# Patient Record
Sex: Female | Born: 1945
Health system: Southern US, Community
[De-identification: ages and names within clinical notes are randomized; demographics above are authoritative.]

## PROBLEM LIST (undated history)

## (undated) ENCOUNTER — Ambulatory Visit: Admission: EM

## (undated) DIAGNOSIS — M1990 Unspecified osteoarthritis, unspecified site: Secondary | ICD-10-CM

## (undated) DIAGNOSIS — R011 Cardiac murmur, unspecified: Secondary | ICD-10-CM

## (undated) DIAGNOSIS — C50919 Malignant neoplasm of unspecified site of unspecified female breast: Secondary | ICD-10-CM

## (undated) DIAGNOSIS — I82409 Acute embolism and thrombosis of unspecified deep veins of unspecified lower extremity: Secondary | ICD-10-CM

## (undated) DIAGNOSIS — I2699 Other pulmonary embolism without acute cor pulmonale: Secondary | ICD-10-CM

## (undated) DIAGNOSIS — I10 Essential (primary) hypertension: Secondary | ICD-10-CM

## (undated) DIAGNOSIS — I272 Pulmonary hypertension, unspecified: Secondary | ICD-10-CM

## (undated) DIAGNOSIS — R569 Unspecified convulsions: Secondary | ICD-10-CM

## (undated) DIAGNOSIS — Z923 Personal history of irradiation: Secondary | ICD-10-CM

## (undated) DIAGNOSIS — R519 Headache, unspecified: Secondary | ICD-10-CM

## (undated) DIAGNOSIS — J45909 Unspecified asthma, uncomplicated: Secondary | ICD-10-CM

## (undated) DIAGNOSIS — R51 Headache: Secondary | ICD-10-CM

## (undated) DIAGNOSIS — G8929 Other chronic pain: Secondary | ICD-10-CM

## (undated) DIAGNOSIS — T7840XA Allergy, unspecified, initial encounter: Secondary | ICD-10-CM

## (undated) DIAGNOSIS — R0600 Dyspnea, unspecified: Secondary | ICD-10-CM

## (undated) HISTORY — DX: Cardiac murmur, unspecified: R01.1

## (undated) HISTORY — DX: Unspecified convulsions: R56.9

## (undated) HISTORY — DX: Other pulmonary embolism without acute cor pulmonale: I26.99

## (undated) HISTORY — DX: Other chronic pain: G89.29

## (undated) HISTORY — PX: KNEE ARTHROSCOPY: SHX127

## (undated) HISTORY — DX: Headache, unspecified: R51.9

## (undated) HISTORY — DX: Unspecified osteoarthritis, unspecified site: M19.90

## (undated) HISTORY — DX: Allergy, unspecified, initial encounter: T78.40XA

## (undated) HISTORY — PX: PARTIAL HYSTERECTOMY: SHX80

## (undated) HISTORY — DX: Malignant neoplasm of unspecified site of unspecified female breast: C50.919

## (undated) HISTORY — DX: Acute embolism and thrombosis of unspecified deep veins of unspecified lower extremity: I82.409

## (undated) HISTORY — DX: Dyspnea, unspecified: R06.00

## (undated) HISTORY — PX: TONSILLECTOMY: SUR1361

## (undated) HISTORY — PX: PLANTAR FASCIA SURGERY: SHX746

## (undated) HISTORY — DX: Headache: R51

## (undated) HISTORY — PX: KNEE SURGERY: SHX244

## (undated) HISTORY — DX: Unspecified asthma, uncomplicated: J45.909

## (undated) HISTORY — PX: ABDOMINAL HYSTERECTOMY: SHX81

---

## 1997-12-24 ENCOUNTER — Other Ambulatory Visit: Admission: RE | Admit: 1997-12-24 | Discharge: 1997-12-24 | Payer: Self-pay | Admitting: Internal Medicine

## 1998-05-03 ENCOUNTER — Emergency Department (HOSPITAL_COMMUNITY): Admission: EM | Admit: 1998-05-03 | Discharge: 1998-05-03 | Payer: Self-pay | Admitting: Emergency Medicine

## 1998-10-17 ENCOUNTER — Encounter: Payer: Self-pay | Admitting: *Deleted

## 1998-10-17 ENCOUNTER — Observation Stay (HOSPITAL_COMMUNITY): Admission: EM | Admit: 1998-10-17 | Discharge: 1998-10-18 | Payer: Self-pay | Admitting: *Deleted

## 1998-10-18 ENCOUNTER — Encounter: Payer: Self-pay | Admitting: Internal Medicine

## 1999-01-05 ENCOUNTER — Other Ambulatory Visit: Admission: RE | Admit: 1999-01-05 | Discharge: 1999-01-05 | Payer: Self-pay | Admitting: Internal Medicine

## 1999-07-20 ENCOUNTER — Emergency Department (HOSPITAL_COMMUNITY): Admission: EM | Admit: 1999-07-20 | Discharge: 1999-07-20 | Payer: Self-pay | Admitting: Emergency Medicine

## 1999-07-30 ENCOUNTER — Emergency Department (HOSPITAL_COMMUNITY): Admission: EM | Admit: 1999-07-30 | Discharge: 1999-07-30 | Payer: Self-pay | Admitting: Emergency Medicine

## 2000-05-20 ENCOUNTER — Other Ambulatory Visit: Admission: RE | Admit: 2000-05-20 | Discharge: 2000-05-20 | Payer: Self-pay | Admitting: Internal Medicine

## 2001-02-03 ENCOUNTER — Encounter: Payer: Self-pay | Admitting: Internal Medicine

## 2001-02-03 ENCOUNTER — Encounter: Admission: RE | Admit: 2001-02-03 | Discharge: 2001-02-03 | Payer: Self-pay | Admitting: Internal Medicine

## 2001-08-30 ENCOUNTER — Encounter: Payer: Self-pay | Admitting: *Deleted

## 2001-08-30 ENCOUNTER — Emergency Department (HOSPITAL_COMMUNITY): Admission: EM | Admit: 2001-08-30 | Discharge: 2001-08-30 | Payer: Self-pay | Admitting: *Deleted

## 2001-10-20 ENCOUNTER — Emergency Department (HOSPITAL_COMMUNITY): Admission: EM | Admit: 2001-10-20 | Discharge: 2001-10-21 | Payer: Self-pay | Admitting: *Deleted

## 2001-10-20 ENCOUNTER — Encounter: Payer: Self-pay | Admitting: Emergency Medicine

## 2001-11-20 ENCOUNTER — Emergency Department (HOSPITAL_COMMUNITY): Admission: EM | Admit: 2001-11-20 | Discharge: 2001-11-20 | Payer: Self-pay | Admitting: Emergency Medicine

## 2001-11-20 ENCOUNTER — Encounter: Payer: Self-pay | Admitting: Emergency Medicine

## 2002-04-06 ENCOUNTER — Encounter: Admission: RE | Admit: 2002-04-06 | Discharge: 2002-07-05 | Payer: Self-pay | Admitting: Internal Medicine

## 2002-05-19 ENCOUNTER — Encounter: Admission: RE | Admit: 2002-05-19 | Discharge: 2002-08-17 | Payer: Self-pay | Admitting: Internal Medicine

## 2003-05-25 ENCOUNTER — Encounter: Admission: RE | Admit: 2003-05-25 | Discharge: 2003-05-25 | Payer: Self-pay | Admitting: Orthopedic Surgery

## 2003-05-28 ENCOUNTER — Ambulatory Visit (HOSPITAL_BASED_OUTPATIENT_CLINIC_OR_DEPARTMENT_OTHER): Admission: RE | Admit: 2003-05-28 | Discharge: 2003-05-28 | Payer: Self-pay | Admitting: Orthopedic Surgery

## 2003-05-28 ENCOUNTER — Ambulatory Visit (HOSPITAL_COMMUNITY): Admission: RE | Admit: 2003-05-28 | Discharge: 2003-05-28 | Payer: Self-pay | Admitting: Orthopedic Surgery

## 2003-06-21 ENCOUNTER — Inpatient Hospital Stay (HOSPITAL_COMMUNITY): Admission: EM | Admit: 2003-06-21 | Discharge: 2003-07-02 | Payer: Self-pay | Admitting: Emergency Medicine

## 2003-09-22 ENCOUNTER — Emergency Department (HOSPITAL_COMMUNITY): Admission: AD | Admit: 2003-09-22 | Discharge: 2003-09-23 | Payer: Self-pay | Admitting: Emergency Medicine

## 2003-10-20 ENCOUNTER — Encounter: Admission: RE | Admit: 2003-10-20 | Discharge: 2003-10-20 | Payer: Self-pay | Admitting: Allergy and Immunology

## 2004-03-03 ENCOUNTER — Emergency Department (HOSPITAL_COMMUNITY): Admission: EM | Admit: 2004-03-03 | Discharge: 2004-03-03 | Payer: Self-pay | Admitting: Emergency Medicine

## 2005-02-11 ENCOUNTER — Emergency Department (HOSPITAL_COMMUNITY): Admission: EM | Admit: 2005-02-11 | Discharge: 2005-02-11 | Payer: Self-pay | Admitting: Emergency Medicine

## 2005-03-15 ENCOUNTER — Ambulatory Visit (HOSPITAL_COMMUNITY): Admission: RE | Admit: 2005-03-15 | Discharge: 2005-03-15 | Payer: Self-pay | Admitting: Allergy and Immunology

## 2005-11-18 ENCOUNTER — Emergency Department (HOSPITAL_COMMUNITY): Admission: EM | Admit: 2005-11-18 | Discharge: 2005-11-18 | Payer: Self-pay | Admitting: Emergency Medicine

## 2006-05-20 HISTORY — PX: OTHER SURGICAL HISTORY: SHX169

## 2006-09-13 ENCOUNTER — Ambulatory Visit: Payer: Self-pay | Admitting: Vascular Surgery

## 2007-09-13 ENCOUNTER — Emergency Department (HOSPITAL_COMMUNITY): Admission: EM | Admit: 2007-09-13 | Discharge: 2007-09-13 | Payer: Self-pay | Admitting: Family Medicine

## 2007-10-19 ENCOUNTER — Emergency Department (HOSPITAL_COMMUNITY): Admission: EM | Admit: 2007-10-19 | Discharge: 2007-10-19 | Payer: Self-pay | Admitting: Emergency Medicine

## 2007-11-26 ENCOUNTER — Emergency Department (HOSPITAL_COMMUNITY): Admission: EM | Admit: 2007-11-26 | Discharge: 2007-11-26 | Payer: Self-pay | Admitting: Emergency Medicine

## 2009-05-11 ENCOUNTER — Encounter: Admission: RE | Admit: 2009-05-11 | Discharge: 2009-07-13 | Payer: Self-pay | Admitting: Internal Medicine

## 2009-10-05 ENCOUNTER — Encounter: Admission: RE | Admit: 2009-10-05 | Discharge: 2009-10-05 | Payer: Self-pay | Admitting: Neurology

## 2010-02-28 ENCOUNTER — Encounter: Admission: RE | Admit: 2010-02-28 | Discharge: 2010-02-28 | Payer: Self-pay | Admitting: Orthopedic Surgery

## 2010-10-11 ENCOUNTER — Encounter: Payer: Medicare Other | Attending: Internal Medicine | Admitting: Dietician

## 2010-12-01 NOTE — Discharge Summary (Signed)
[name], [name]                           ACCOUNT NO.:  [id number]   MEDICAL RECORD NO.:  [id number]                   PATIENT TYPE:  INP   LOCATION:  5027                                 FACILITY:  [facility]   PHYSICIAN:  [name], M.D.            DATE OF BIRTH:  Jun 27, 1946   DATE OF ADMISSION:  06/21/2003  DATE OF DISCHARGE:  07/02/2003                                 DISCHARGE SUMMARY   PRIMARY CARE PHYSICIAN:  [name], M.D.   CONSULTANTS:  Dr. Pierce Price, hematology.   DISCHARGE DIAGNOSES:  1. Pulmonary embolism.  2. Seizure disorder.  3. History of asthma.  4. Gastroesophageal reflux disease.  5. Obesity.  6. Endoscopic plantar fasciotomy of the right foot on May 28, 2003.  7. Noted adrenal mass.   DISCHARGE MEDICATIONS:  1. Coumadin 12.5 mg p.o. q.h.s.  2. Percocet 5/325 mg p.o. q.8h. p.r.n.  3. Lexapro 10 mg p.o. daily.  4. Tegretol XR, 400 mg b.i.d.  5. Advair 250/ 50 one b.i.d.  6. Vitamin B12 supplements.  7. Clarinex.  8. Aleve p.r.n.   HOSPITAL COURSE:  This is a 65 year old African American female who was  status post a tarsal tunnel release and endoscopic plantar fasciotomy of  May 28, 2003, who also has a history of asthma and seizure disorder,  who had been essentially at bed rest since her surgery.  On the day of  June 21, 2003, started having acute onset of chest pain and shortness of  breath.  She was brought in. Her EKG showed sinus tachycardia.  BNP was  negative and ABG showed increased pCO2 of 53.  The patient had no previous  history of lung disease.  A CT of the chest showed a left-lower-lobe  atelectasis and a left upper lobe pulmonary embolus.  Dopplers were negative  for DVT although the patient has morbid obesity.  These were poor studies.  She was started on oxygen.  She immediately felt better.   As part of the patient's routine workup, she was started on heparin as well  as oxygen and pain control, and  routine lab work was all normal however.  She was sent for lupus anticoagulant, and this was noted to be positive.  Hematology, Dr. Pierce Price was consulted on June 28, 2003, and his  cardiolipin antibodies were checked as well as protein C&S, and Dr. Elisabeth Price  Leiden studies were all ordered.  In the interim, the patient was started on  Coumadin with therapy goal range of 2 to 3.  However, in the setting of a  possible coagulable state, a goal closer to 3 was preferred.  The patient  tolerated it well.  She had no further drops in her oxygen saturation.  She  was able to ambulate well.  Physical therapy assessed her and found her O2  saturations to be stable, and she did not need intensive physical  therapy  regimen.   In the process of getting her INR in the therapeutic range of 2 to 3, her  lab studies for anticardiolipin antithrombin protein factor V Leiden, as  well as antiphospholipid level all returned negative.  To date, protein C&S  are still pending.  However, on discussion with hematology, the changes for  her likely having coagulopathy are minimal and the patient could safely go  home.  The patient did well, and on July 02, 2003, was in the  therapeutic range age 65.  She was felt to be stable to go home.  The changes  of her having coagulopathy again seemed to be minimal at best.   In regards to the patient's questionable adrenal mass, the patient was  complaining of some light flank  and hip pain which seemed to persist.  A back x-ray showed a questionable  large amount of right stool in the colon, but as the patient was on  anticoagulation, a CT was recommended to rule out a retroperitoneal process  or bleed.  CT of the abdomen and pelvis were done, and again showed no  evidence of any type of bleeding.  It did show what appeared to be a 3.9 cm  mass with calcification in the right adnexal area.  A CA 125 level was  checked which was negative, and a pelvic  ultrasound was ordered and done  which showed absolutely no evidence of any type of right adnexal mass.  It  was felt that perhaps this may be incidental and no further workup would be  evaluated other than perhaps to repeat a CT of the abdomen in six months for  further evaluation.  In regards to the patient's asthma, her breathing  remained stable.  It was noted that she was on albuterol and Qvar inhalers  on admission, both of which are the same inhaler.  She was put on Advair  inhaler 50/ 250 which she did well with this.   The patient was also complaining of hip pain originally on the right, but  also ended up being on the left as well.  Hip x-rays were checked and showed  early evidence of DJD, otherwise, no problem.  At this time,  antiinflammatory medicines or NSAIDs are not recommended for this patient,  as with the combination of Coumadin and NSAIDs she may be at increased risk  for bleed.  The patient otherwise is stable.  She had no problems with her  seizure disorder.  She was continued on her seizure medication.   On the day of discharge, her INR is currently at 2.  She is recommended to  continue Coumadin 12.5 mg p.o. daily.   DISCHARGE INSTRUCTIONS:  1. She will follow up with Dr. Selena Batten Price's office within the next seven     days.  2. She will have PT/INR checked in three or four days.  A prescription has     been given for this.  3. She will continue on the rest of her medications, Percocet being given     for pain.  4. The patient also was complaining of some depression and occasionally     getting tearful given her situation.  She was started on Lexapro 10 mg a     few days ago and tolerated this.  This medication will be continued as     well.  She received a prescription for this medication.  5. She will continue on her other medications.  DISCHARGE CONDITION:  Much improved.   DISCHARGE ACTIVITY:  1. As tolerated. 2. She has physical therapy assigned at  home secondary to her foot surgery.   DISCHARGE DIET:  She is also following with her dietitian.  She has an  appointment in the next two weeks at [name] office.   FOLLOWUP:  1. She will follow up with Dr. Renae Price in the next few days.  2. With orthopedic doctor as scheduled.                                                [name], M.D.    SKK/MEDQ  D:  07/02/2003  T:  07/04/2003  Job:  [id number]

## 2010-12-01 NOTE — Op Note (Signed)
[name], [name]                           ACCOUNT NO.:  [id number]   MEDICAL RECORD NO.:  [id number]                   PATIENT TYPE:  AMB   LOCATION:  DSC                                  FACILITY:  [facility]   PHYSICIAN:  Tinnie Gens A. Petrinitz, D.P.M.        DATE OF BIRTH:  1945-11-12   DATE OF PROCEDURE:  DATE OF DISCHARGE:                                 OPERATIVE REPORT   SURGEON:  Tinnie Gens A. Petrinitz, D.P.M.   ASSISTANT:  Max T. Hyatt, D.P.M.   PREOPERATIVE DIAGNOSES:  1. Plantar fasciitis right heel.  2. Tarsal tunnel syndrome right foot.   POSTOPERATIVE DIAGNOSES:  1. Plantar fasciitis right heel.  2. Tarsal tunnel syndrome right foot.   PROCEDURES PERFORMED:  1. Endoscopic plantar fasciotomy right heel.  2. Tarsal tunnel release right.   ANESTHESIA:  General LMA.   ESTIMATED BLOOD LOSS:  Minimal, controlled with ankle tourniquet, Bovie, and  local with epinephrine.   DESCRIPTION OF PROCEDURE:  The patient was brought to the OR and placed on  the table in the supine position. The patient had had prior knee surgery and  is prepped and draped in the usual aseptic manner. A sterile ankle  tourniquet is placed superior to the malleoli right foot with sufficient  padding to prevent contusion. The right foot is exsanguinated as marked  bandage. The ankle tourniquet was inflated to 250 mmHg and the following  procedure performed.   Procedure #1:  Endoscopic plantar fasciotomy right heel. The right heel is  further prepped with Betadine ointment utilizing anatomical landmarks. The  floor of the calcaneus, medial calcaneal tubercle and palpating the plantar  fascia. An incision planned just distal to the medial calcaneal tubercle  superior to the plantar fascia. This is executed with a 15 blade. The deeper  soft tissue is spread with a hemostat and the superior and inferior margins  of the plantar fascia are identified with the hemostat. The blunt probe from  the  endoscopic fascial instrumentation is utilized to form a channel  inferior to the plantar fascia from medial to lateral. The obturator and  cannula is placed in this channel. A lateral incision is performed and the  obturator is exited through the lateral incision. The obturator is removed.  The cannula slot is positioned facing superior. The cannula is cleaned with  several sterile Q-tips. The endoscope is placed on the medial side and  utilizing the blunt probe the plantar fascia is identified. The plantar  fascia is then incised from medial to lateral utilizing the triangle blade.  This is a partial release releasing the medial two-thirds, leaving the  lateral third intact. The cannula is rotated and the inferior aspect is  evaluated finding no plantar fascial bands. It is again rotated back to the  superior position and flushed copiously with antibiotic solution. The  cannula is removed and further antibiotic flushes performed. Attention is  then directed to the  tarsal tunnel area following procedure performed.   Procedure #2:  Tarsal tunnel release.  The anatomical landmarks of the  medial malleolus and the Achilles tendon are used to plan the incision  centered between these two landmarks along the tarsal tunnel. This is  extended in a lazy hockey stick fashion connecting with the incision of the  plantar fascial release. This is then executed with a 15-blade. Further  sharp and blunt tissue dissection performed to superficial and deep fascial  layers. The extensor retinaculum is identified and utilizing a Metzenbaum  with blunt technique teased inferior to the retinaculum with great care not  to incise any deeper structures, the retinaculum is released. At this time  the neurovascular bundle consisting of the two veins, posterior tibial  artery, and nerve identified. All soft tissue is released for the entire  length of this incision from proximal to distal freeing up the tarsal  tunnel  canal and mobilizing the neurovascular bundle. At the inferior portion the  adductor canal is released and the adductor muscle belly released giving  good plantar release. At this time, after complete release, the deep and  superficial fascial layers on either side and inferior skin is injected with  a combination of 2% Xylocaine with epinephrine 1:200,000 plus 0.5% Marcaine.  The ankle tourniquet is deflated and the posterior tibial artery is  inspected. It is intact with no bleeding. There is just bleeding of  superficial capillaries and these areas after sponging and limited Bovie are  under control. At this time the superficial fascial layer is reapproximated  with 4-0 Monocryl. Skin closure is performed with 5-0 Prolene. The plantar  surgical site is injected with a combination of 4 cc 0.5% Marcaine plus 2 cc  dexamethasone phosphate. The skin is prepped with Benzoin followed by  reinforcing Steri-Strips, prepped with Betadine and Xeroform applied. A dry  sterile dressing is applied followed by a fluff Kerlix, Coban compression,  and ACE bandage. Capillary filling time is noted to return to instantaneous  in all digits. The patient tolerated the procedure and anesthesia well.  Transported to recovery room. Dispensed detailed, written postoperative  instructions. The patient will be partial weightbearing with an air fracture  walker and will return to the office in four to seven days for further  evaluation. The patient was advised to contact myself or emergency pager  service if questions or problems arise.                                               Jeffrey A. Petrinitz, D.P.M.    ZOX/WRUE  D:  05/28/2003  T:  05/28/2003  Job:  [id number]

## 2010-12-01 NOTE — Op Note (Signed)
[name], [name]                           ACCOUNT NO.:  [id number]   MEDICAL RECORD NO.:  [id number]                   PATIENT TYPE:  OUT   LOCATION:  DFTL                                 FACILITY:  [facility]   PHYSICIAN:  Feliberto Gottron. Turner Daniels, M.D.                DATE OF BIRTH:  Apr 18, 1946   DATE OF PROCEDURE:  05/28/2003  DATE OF DISCHARGE:  05/28/2003                                 OPERATIVE REPORT   PREOPERATIVE DIAGNOSES:  Right knee chondromalacia and right tarsal tunnel  syndrome.   POSTOPERATIVE DIAGNOSES:  Right knee chondromalacia and right tarsal tunnel  syndrome.   OPERATION PERFORMED:  Right knee arthroscopic debridement of chondromalacia  patella, chondromalacia lateral tibial condyle and tarsal tunnel release was  done and dictated by Tinnie Gens A. Petrinitz, D.P.M.   SURGEON:  Feliberto Gottron. Turner Daniels, M.D.   ASSISTANTLaural Benes. Jannet Mantis.   ANESTHESIA:  General LMA.   ESTIMATED BLOOD LOSS:  Minimal.   FLUIDS REPLACED:  800 mL crystalloids.   DRAINS:  None.   TOURNIQUET TIME:  None.   INDICATIONS FOR PROCEDURE:  The patient is a 65 year old woman with right  tarsal tunnel syndrome under the care of Jeffrey A. Petrinitz, D.P.M., who  also has chondromalacia patella, right knee and has failed conservative  treatment with anti-inflammatory medicines, observation and exercise.  She  desires elective arthroscopic evaluation of her right knee and concomitant  tarsal tunnel release by Dr. Wynelle Cleveland as a separate procedure.   DESCRIPTION OF PROCEDURE:  The patient was identified by arm band and take  to the operating room at [facility].  Appropriate  anesthetic monitors were attached.  General LMA anesthesia was induced with  the patient in the supine position.  Lateral post applied to the table and  the right lower extremity prepped and draped in the usual sterile fashion  from the tips of the toes to the midthigh.  The inferomedial and  inferolateral  peripatellar regions were then infiltrated with 2 to 3 mL of  0.25% Marcaine solution and standard inferomedial and inferolateral  peripatellar portals were then made with a #11 blade allowing introduction  of the arthroscope through the inferolateral portal and the outflow through  the inferomedial portal.  Diagnostic arthroscopy revealed grade 3  chondromalacia of the patella which was debrided back to stable margins as  well as the lateral tibial condyle. The menisci were thoroughly probed and  found to be intact.  Cruciate ligaments were intact.  The gutters were  cleared.  Small bits of articular cartilage were noted in the joint fluid  and taken continuously through  the outflow during the procedure.  At this point the knee was washed out  with normal saline solution.  The arthroscopic instruments removed and  Tinnie Gens A. Petrinitz, D.P.M. did proceeded with the tarsal tunnel release  after a dressing of Xeroform, 4 x 4  dressing sponges, Webril and Ace wrap  was applied under sterile conditions.                                               Feliberto Gottron. Turner Daniels, M.D.    Ovid Curd  D:  07/12/2003  T:  07/12/2003  Job:  [id number]

## 2010-12-01 NOTE — H&P (Signed)
[name], [name]                           ACCOUNT NO.:  [id number]   MEDICAL RECORD NO.:  [id number]                   PATIENT TYPE:  INP   LOCATION:  1823                                 FACILITY:  [facility]   PHYSICIAN:  Hollice Espy, M.D.            DATE OF BIRTH:  [birth year]   DATE OF ADMISSION:  06/21/2003  DATE OF DISCHARGE:                                HISTORY & PHYSICAL   PRIMARY CARE DOCTOR:  Merlene Laughter. Renae Gloss, M.D.   HISTORY OF PRESENT ILLNESS:  This is a 65 year old African American female  status post right foot surgery, specifically a tarsal tunnel release and  endoscopic plantar fasciotomy on May 28, 2003.  She also has a history  of asthma and seizure disorder.  She has been found to have a pulmonary  embolus.  Since her surgery, the patient has been essentially at bed rest,  but otherwise well with no complaints, when sudden onset today she started  having some left-sided chest pain and shortness of breath.  The patient was  fine previously.  She started having pain over her left side, which was  pleuritic.  She also described some increased breath, worse with  inspiration.  She came into the ED and had an evaluation.  Her O2 saturation  was 90% on room air.  She was started on 2 L and saturation came up.  Cardiac enzymes were negative.  EKG showed sinus tachycardia.  Her BNP was  negative.  ABG done showed an increased pCO2 of 53.  The patient has no  previous history of lung disease other than her asthma.  CT of the chest  showed a left lower lobe atelectasis and left upper lobe pulmonary embolus.  Dopplers, which were a poor study due to the patient's size, were negative  for DVT.  The patient was started on O2 and immediately felt better.  However, she was given one dose of morphine and she became very sleepy.  Apparently the patient was quite lethargic and difficult to arouse, but she  does state that she is feeling better.  She still complains of  some mild  shortness of breath, worse when she takes a deep breath.  She denies any  chest pain.  Otherwise she states she is feeling better.  She denies any  headaches.  She denies any abdominal pain.  She feels overall very fatigued.  No other complaints.   PAST MEDICAL HISTORY:  1. Status post tarsal tunnel release.  2. Endoscopic plantar fasciotomy of the right foot on May 28, 2003.  3. History of seizure disorder.  4. Asthma.  5. GERD.  6. Obesity.   MEDICATIONS:  The patient is on:  1. Tegretol XR 400 mg b.i.d.  2. Albuterol inhaler p.r.n.  3. Qvar inhaler one puff b.i.d.  4. Vitamin B12 supplements.  5. [facility] herbal medicine.  6. Vicodin one tablet p.o. 7.5/750 mg q.4-6h.  p.r.n. for pain which was     given to her since her foot surgery.  7. Clarinex 5 mg p.o. daily.  8. Aleve p.r.n.   ALLERGIES:  She is allergic to PENICILLIN and DILANTIN.   SOCIAL HISTORY:  She denies any tobacco use, alcohol, or drugs, although she  is quite groggy and could not clarify this.   FAMILY HISTORY:  Noncontributory.   PHYSICAL EXAMINATION:  VITAL SIGNS:  On admission, temperature 98.3 degrees,  respirations 24, and blood pressure 134/69.  She is currently saturating 96%  on 2 L nasal cannula.  GENERAL APPEARANCE:  She is quite lethargic, but arousable.  She is oriented  x 3.  She is morbidly obese.  HEENT:  Normocephalic and atraumatic.  She has a very narrow airway.  NECK:  She has no carotid bruits.  HEART:  Regular rate and rhythm, although she is tender with deep  auscultation.  LUNGS:  Clear to auscultation bilaterally, although she has poor airway  exchange effort.  ABDOMEN:  Soft and nontender.  She is obese.  Positive bowel sounds.  EXTREMITIES:  She has quite edematous, obese legs.  Negative Homans sign,  although the patient was difficult to arouse.   LABORATORY DATA:  CT of her chest shows a left lower lobe atelectasis and a  left upper lobe pulmonary  embolus.  Her white count is 8.6, hemoglobin 13.2,  hematocrit 39.3, MCV 83, and platelet count 368.  She has a 63% shift, which  is normal.  Sodium 138, potassium 4.3, chloride 106, bicarbonate 29, BUN 7,  glucose 125.  CPK __________, MB less than 1, and troponin I less than 0.05.  The BNP is less than 30.  Her creatinine is 0.9.  The PT and PTT are  pending.   ASSESSMENT AND PLAN:  1. This is a 65 year old white female with leg surgery three weeks ago,     obesity, and inactivity, who presents with acute shortness of breath and     pleuritic chest pain.  Found to have a pulmonary embolus.  Will go ahead     and start the patient on heparin and after that will begin Coumadin     therapy to continue long-term anticoagulation.  The patient is medically     stable and will go to a telemetry bed.  2. In regards to gastrointestinal reflux disease, continue Pepcid.  3. In regards to her asthma, when her O2 saturations appear relatively     stable, will continue her Qvar and albuterol.  I do not feel at this time     that we need to put her on steroids.  She has no sign of expiratory     wheezing, although given her large frame on exam, it might be difficult.  4. Seizure disorder.  Continue Tegretol.  Overall the patient shows no     evidence of any seizure disorder at this time.  5. Will also give pain control with IV morphine.                                                Hollice Espy, M.D.    SKK/MEDQ  D:  06/21/2003  T:  06/21/2003  Job:  [id number]   cc:   Merlene Laughter. Renae Gloss, M.D.  [address]  [address]  [place]  [place] [address]  Fax: [phone]

## 2010-12-01 NOTE — Consult Note (Signed)
[name], [name]                           ACCOUNT NO.:  [id number]   MEDICAL RECORD NO.:  [id number]                   PATIENT TYPE:  INP   LOCATION:  5027                                 FACILITY:  [facility]   PHYSICIAN:  Pierce Crane, M.D.                   DATE OF BIRTH:  04-20-1946   DATE OF CONSULTATION:  DATE OF DISCHARGE:                                   CONSULTATION   REASON FOR CONSULTATION:  Pulmonary embolism, rule out coagulopathy.   HISTORY:  Ms. Wienke is a 65 year old woman from [place].   PAST MEDICAL HISTORY:  1. Seizure disorder on Tegretol for a number of years.  2. History of asthma.  3. GERD.  4. History of obesity.   MEDICATIONS PRIOR TO ADMISSION:  Tegretol-XR 400 mg b.i.d., albuterol  inhaler, Qvar inhaler, Vicodin, __________ a day   SOCIAL HISTORY:  She is single and has one child.  Works at the [employer].   ALLERGIES:  1. PENICILLIN.  2. DILANTIN.   REVIEW OF SYSTEMS:  Denies any headaches, blurry vision, shortness of breath  or cough, has some residual chest pain.  Denies any hemoptysis, easy  bruising or bleeding.  Denies any numbness or tingling of the hands or feet.  Denies any abdominal complaints.   PHYSICAL EXAMINATION:  GENERAL:  A pleasant woman actually lying comfortably  in bed.  Having some difficulty ambulating.  VITAL SIGNS:  Blood pressure is 150/84, temperature 97.5, heart rate is 122.  HEENT:  No lymphadenopathy.  LUNGS:  Decreased air entry bilaterally.  HEART:  __________  BREASTS:  Not examined.  Both axillae negative.  ABDOMEN:  Obese.  Difficult to appreciate any organomegaly.  EXTREMITIES:  __________  without peripheral edema.   IMPRESSION:  Ms. Rooks presents with a pulmonary embolism arising in a  setting of a recent orthopedic surgery and a prolonged bedrest.  She has  other comorbid problems including a history of asthma and obesity likely  contributing to her pulmonary embolism.  She is positive for lupus  anticoagulant.  We will check anticardiolipin antibodies to confirm whether  she does have a hypercoagulable state.  To date, all the other parameters  are negative.  The AP3 level has to also be checked at some point, and we are awaiting  results of protein-C and S.  Note that a CT of the abdomen was suggestive of  an adnexal mass which shows a calcification and this needs to be worked up  as well.  I will follow her in hospital.                                               Pierce Crane, M.D.    PR/MEDQ  D:  06/28/2003  T:  06/28/2003  Job:  [id number]   cc:   Merlene Laughter. Renae Gloss, M.D.  [address]  [address]  [place]  [place] [address]  Fax: [phone]

## 2010-12-27 ENCOUNTER — Encounter (HOSPITAL_COMMUNITY): Payer: Self-pay | Admitting: Radiology

## 2010-12-27 ENCOUNTER — Emergency Department (HOSPITAL_COMMUNITY): Payer: Medicare Other

## 2010-12-27 ENCOUNTER — Inpatient Hospital Stay (HOSPITAL_COMMUNITY)
Admission: EM | Admit: 2010-12-27 | Discharge: 2010-12-29 | DRG: 287 | Disposition: A | Discharge status: Home / Self Care | Payer: Medicare Other | Attending: Internal Medicine | Admitting: Internal Medicine

## 2010-12-27 DIAGNOSIS — J45909 Unspecified asthma, uncomplicated: Secondary | ICD-10-CM | POA: Diagnosis present

## 2010-12-27 DIAGNOSIS — R0789 Other chest pain: Principal | ICD-10-CM | POA: Diagnosis present

## 2010-12-27 DIAGNOSIS — G43909 Migraine, unspecified, not intractable, without status migrainosus: Secondary | ICD-10-CM | POA: Diagnosis present

## 2010-12-27 DIAGNOSIS — G40909 Epilepsy, unspecified, not intractable, without status epilepticus: Secondary | ICD-10-CM | POA: Diagnosis present

## 2010-12-27 DIAGNOSIS — I2789 Other specified pulmonary heart diseases: Secondary | ICD-10-CM | POA: Diagnosis present

## 2010-12-27 DIAGNOSIS — I10 Essential (primary) hypertension: Secondary | ICD-10-CM | POA: Diagnosis present

## 2010-12-27 DIAGNOSIS — Z86711 Personal history of pulmonary embolism: Secondary | ICD-10-CM

## 2010-12-27 DIAGNOSIS — Z880 Allergy status to penicillin: Secondary | ICD-10-CM

## 2010-12-27 DIAGNOSIS — Z86718 Personal history of other venous thrombosis and embolism: Secondary | ICD-10-CM

## 2010-12-27 DIAGNOSIS — F411 Generalized anxiety disorder: Secondary | ICD-10-CM | POA: Diagnosis present

## 2010-12-27 DIAGNOSIS — E119 Type 2 diabetes mellitus without complications: Secondary | ICD-10-CM | POA: Diagnosis present

## 2010-12-27 DIAGNOSIS — K219 Gastro-esophageal reflux disease without esophagitis: Secondary | ICD-10-CM | POA: Diagnosis present

## 2010-12-27 HISTORY — DX: Essential (primary) hypertension: I10

## 2010-12-27 LAB — COMPREHENSIVE METABOLIC PANEL
ALT: 15 U/L (ref 0–35)
AST: 17 U/L (ref 0–37)
Albumin: 3.4 g/dL — ABNORMAL LOW (ref 3.5–5.2)
Alkaline Phosphatase: 84 U/L (ref 39–117)
BUN: 9 mg/dL (ref 6–23)
CO2: 26 mEq/L (ref 19–32)
Calcium: 8.9 mg/dL (ref 8.4–10.5)
Chloride: 105 mEq/L (ref 96–112)
Creatinine, Ser: 0.8 mg/dL (ref 0.4–1.2)
GFR calc Af Amer: >60 mL/min (ref >60)
GFR calc non Af Amer: >60 mL/min (ref >60)
Glucose, Bld: 132 mg/dL — ABNORMAL HIGH (ref 70–99)
Potassium: 3.6 mEq/L (ref 3.5–5.1)
Sodium: 142 mEq/L (ref 135–145)
Total Bilirubin: 0.5 mg/dL (ref 0.3–1.2)
Total Protein: 6.5 g/dL (ref 6.0–8.3)

## 2010-12-27 LAB — DIFFERENTIAL
Basophils Absolute: 0 10*3/uL (ref 0.0–0.1)
Basophils Relative: 0 % (ref 0–1)
Eosinophils Absolute: 0 10*3/uL (ref 0.0–0.7)
Eosinophils Relative: 1 % (ref 0–5)
Lymphocytes Relative: 25 % (ref 12–46)
Lymphs Abs: 1.9 10*3/uL (ref 0.7–4.0)
Monocytes Absolute: 0.6 10*3/uL (ref 0.1–1.0)
Monocytes Relative: 8 % (ref 3–12)
Neutro Abs: 5 10*3/uL (ref 1.7–7.7)
Neutrophils Relative %: 66 % (ref 43–77)

## 2010-12-27 LAB — CK TOTAL AND CKMB (NOT AT ARMC)
CK, MB: 3.6 ng/mL (ref 0.3–4.0)
Relative Index: 3.4 — ABNORMAL HIGH (ref 0.0–2.5)
Total CK: 107 U/L (ref 7–177)

## 2010-12-27 LAB — CARDIAC PANEL(CRET KIN+CKTOT+MB+TROPI)
CK, MB: 3.2 ng/mL (ref 0.3–4.0)
Relative Index: 2.9 — ABNORMAL HIGH (ref 0.0–2.5)
Total CK: 111 U/L (ref 7–177)
Troponin I: <0.3 ng/mL (ref <0.30)

## 2010-12-27 LAB — PROTIME-INR
INR: 1.14 (ref 0.00–1.49)
Prothrombin Time: 14.8 seconds (ref 11.6–15.2)

## 2010-12-27 LAB — URINALYSIS, ROUTINE W REFLEX MICROSCOPIC
Bilirubin Urine: NEGATIVE
Glucose, UA: NEGATIVE mg/dL
Hgb urine dipstick: NEGATIVE
Ketones, ur: NEGATIVE mg/dL
Nitrite: NEGATIVE
Protein, ur: NEGATIVE mg/dL
Specific Gravity, Urine: 1.007 (ref 1.005–1.030)
Urobilinogen, UA: 0.2 mg/dL (ref 0.0–1.0)
pH: 7.5 (ref 5.0–8.0)

## 2010-12-27 LAB — TROPONIN I: Troponin I: <0.3 ng/mL (ref <0.30)

## 2010-12-27 LAB — CBC
HCT: 38.4 % (ref 36.0–46.0)
Hemoglobin: 13.2 g/dL (ref 12.0–15.0)
MCH: 26.1 pg (ref 26.0–34.0)
MCHC: 34.4 g/dL (ref 30.0–36.0)
MCV: 75.9 fL — ABNORMAL LOW (ref 78.0–100.0)
Platelets: 238 10*3/uL (ref 150–400)
RBC: 5.06 MIL/uL (ref 3.87–5.11)
RDW: 14.8 % (ref 11.5–15.5)
WBC: 7.6 10*3/uL (ref 4.0–10.5)

## 2010-12-27 LAB — URINE MICROSCOPIC-ADD ON

## 2010-12-27 LAB — HEPARIN LEVEL (UNFRACTIONATED): Heparin Unfractionated: 0.76 IU/mL — ABNORMAL HIGH (ref 0.30–0.70)

## 2010-12-27 LAB — TSH: TSH: 0.467 u[IU]/mL (ref 0.350–4.500)

## 2010-12-27 LAB — APTT: aPTT: 30 seconds (ref 24–37)

## 2010-12-27 LAB — MAGNESIUM: Magnesium: 1.9 mg/dL (ref 1.5–2.5)

## 2010-12-27 MED ORDER — IOHEXOL 300 MG/ML  SOLN
100.0000 mL | Freq: Once | INTRAMUSCULAR | Status: AC | PRN
  Administered 2010-12-27: 100 mL via INTRAVENOUS

## 2010-12-28 HISTORY — PX: CARDIAC CATHETERIZATION: SHX172

## 2010-12-28 LAB — CBC
HCT: 36 % (ref 36.0–46.0)
HCT: 37.1 % (ref 36.0–46.0)
Hemoglobin: 12.3 g/dL (ref 12.0–15.0)
Hemoglobin: 12.7 g/dL (ref 12.0–15.0)
MCH: 26.1 pg (ref 26.0–34.0)
MCH: 26 pg (ref 26.0–34.0)
MCHC: 34.2 g/dL (ref 30.0–36.0)
MCHC: 34.2 g/dL (ref 30.0–36.0)
MCV: 76.3 fL — ABNORMAL LOW (ref 78.0–100.0)
MCV: 76 fL — ABNORMAL LOW (ref 78.0–100.0)
Platelets: 235 10*3/uL (ref 150–400)
Platelets: 244 10*3/uL (ref 150–400)
RBC: 4.72 MIL/uL (ref 3.87–5.11)
RBC: 4.88 MIL/uL (ref 3.87–5.11)
RDW: 14.9 % (ref 11.5–15.5)
RDW: 15 % (ref 11.5–15.5)
WBC: 9.2 10*3/uL (ref 4.0–10.5)
WBC: 7.5 10*3/uL (ref 4.0–10.5)

## 2010-12-28 LAB — POCT I-STAT 3, VENOUS BLOOD GAS (G3P V)
Acid-Base Excess: 1 mmol/L (ref 0.0–2.0)
Bicarbonate: 26.3 mEq/L — ABNORMAL HIGH (ref 20.0–24.0)
O2 Saturation: 63 %
TCO2: 28 mmol/L (ref 0–100)
pCO2, Ven: 44.5 mmHg — ABNORMAL LOW (ref 45.0–50.0)
pH, Ven: 7.38 — ABNORMAL HIGH (ref 7.250–7.300)
pO2, Ven: 33 mmHg (ref 30.0–45.0)

## 2010-12-28 LAB — BASIC METABOLIC PANEL
BUN: 10 mg/dL (ref 6–23)
CO2: 27 mEq/L (ref 19–32)
Calcium: 9.1 mg/dL (ref 8.4–10.5)
Chloride: 106 mEq/L (ref 96–112)
Creatinine, Ser: 0.79 mg/dL (ref 0.4–1.2)
GFR calc Af Amer: >60 mL/min (ref >60)
GFR calc non Af Amer: >60 mL/min (ref >60)
Glucose, Bld: 127 mg/dL — ABNORMAL HIGH (ref 70–99)
Potassium: 4.1 mEq/L (ref 3.5–5.1)
Sodium: 141 mEq/L (ref 135–145)

## 2010-12-28 LAB — POCT I-STAT 3, ART BLOOD GAS (G3+)
Bicarbonate: 24.7 mEq/L — ABNORMAL HIGH (ref 20.0–24.0)
O2 Saturation: 92 %
TCO2: 26 mmol/L (ref 0–100)
pCO2 arterial: 41.2 mmHg (ref 35.0–45.0)
pH, Arterial: 7.386 (ref 7.350–7.400)
pO2, Arterial: 64 mmHg — ABNORMAL LOW (ref 80.0–100.0)

## 2010-12-28 LAB — CARDIAC PANEL(CRET KIN+CKTOT+MB+TROPI)
CK, MB: 2.7 ng/mL (ref 0.3–4.0)
Relative Index: 2.6 — ABNORMAL HIGH (ref 0.0–2.5)
Total CK: 103 U/L (ref 7–177)
Troponin I: <0.3 ng/mL (ref <0.30)

## 2010-12-28 LAB — HEPARIN LEVEL (UNFRACTIONATED): Heparin Unfractionated: 0.54 IU/mL (ref 0.30–0.70)

## 2010-12-28 LAB — SEDIMENTATION RATE: Sed Rate: 5 mm/hr (ref 0–22)

## 2010-12-28 LAB — POCT ACTIVATED CLOTTING TIME: Activated Clotting Time: 164 seconds

## 2010-12-28 LAB — D-DIMER, QUANTITATIVE: D-Dimer, Quant: <0.22 ug/mL-FEU (ref 0.00–0.48)

## 2010-12-28 LAB — PROTIME-INR
INR: 1.17 (ref 0.00–1.49)
Prothrombin Time: 15.1 seconds (ref 11.6–15.2)

## 2010-12-28 LAB — GLUCOSE, CAPILLARY: Glucose-Capillary: 92 mg/dL (ref 70–99)

## 2010-12-29 LAB — PROTEIN S ACTIVITY: Protein S Activity: 56 % — ABNORMAL LOW (ref 69–129)

## 2010-12-29 LAB — BASIC METABOLIC PANEL
BUN: 12 mg/dL (ref 6–23)
CO2: 27 mEq/L (ref 19–32)
Calcium: 9 mg/dL (ref 8.4–10.5)
Chloride: 106 mEq/L (ref 96–112)
Creatinine, Ser: 0.94 mg/dL (ref 0.50–1.10)
GFR calc Af Amer: >60 mL/min (ref >60)
GFR calc non Af Amer: 60 mL/min — ABNORMAL LOW (ref >60)
Glucose, Bld: 125 mg/dL — ABNORMAL HIGH (ref 70–99)
Potassium: 4.9 mEq/L (ref 3.5–5.1)
Sodium: 139 mEq/L (ref 135–145)

## 2010-12-29 LAB — LUPUS ANTICOAGULANT PANEL
DRVVT: 42.1 secs (ref 36.2–44.3)
Lupus Anticoagulant: NOT DETECTED
PTT Lupus Anticoagulant: 101.8 secs — ABNORMAL HIGH (ref 30.0–45.6)
PTTLA 4:1 Mix: 91.7 secs — ABNORMAL HIGH (ref 30.0–45.6)
PTTLA Confirmation: 3.6 secs (ref <8.0)

## 2010-12-29 LAB — PROTEIN C ACTIVITY: Protein C Activity: 140 % — ABNORMAL HIGH (ref 75–133)

## 2010-12-29 LAB — PROTEIN C, TOTAL: Protein C, Total: 101 % (ref 72–160)

## 2010-12-29 LAB — CBC
HCT: 36.3 % (ref 36.0–46.0)
Hemoglobin: 12.3 g/dL (ref 12.0–15.0)
MCH: 26 pg (ref 26.0–34.0)
MCHC: 33.9 g/dL (ref 30.0–36.0)
MCV: 76.7 fL — ABNORMAL LOW (ref 78.0–100.0)
Platelets: 237 10*3/uL (ref 150–400)
RBC: 4.73 MIL/uL (ref 3.87–5.11)
RDW: 15 % (ref 11.5–15.5)
WBC: 7.7 10*3/uL (ref 4.0–10.5)

## 2010-12-29 LAB — POCT OCCULT BLOOD STOOL (DEVICE): Fecal Occult Bld: NEGATIVE

## 2010-12-29 LAB — CARDIOLIPIN ANTIBODIES, IGM+IGG
Anticardiolipin IgG: 2 GPL U/mL — ABNORMAL LOW (ref <23)
Anticardiolipin IgM: 5 MPL U/mL — ABNORMAL LOW (ref <11)

## 2010-12-29 LAB — PROTEIN S, TOTAL: Protein S Ag, Total: 88 % (ref 60–150)

## 2010-12-29 LAB — HEPARIN LEVEL (UNFRACTIONATED): Heparin Unfractionated: 0.47 IU/mL (ref 0.30–0.70)

## 2011-01-01 LAB — PROTHROMBIN GENE MUTATION

## 2011-01-05 NOTE — Cardiovascular Report (Signed)
[name], [name]               ACCOUNT NO.:  [id number]  MEDICAL RECORD NO.:  [id number]  LOCATION:  3742                         FACILITY:  [facility]  PHYSICIAN:  Nicki Guadalajara, M.D.     DATE OF BIRTH:  1946-03-09  DATE OF PROCEDURE: DATE OF DISCHARGE:                           CARDIAC CATHETERIZATION   INDICATIONS:  [name] is a 65 year old African American female who has a history of moderately severe pulmonary hypertension for at least 5 years.  Remotely, she apparently was diagnosed as having a lupus anticoagulant in 2004 and a history of pulmonary embolism.  She had been on Coumadin therapy remotely.  There is also history of migraines. Recently the patient has experienced increasing shortness of breath. She recently was admitted with chest pain.  A chest CT was negative for recurrent PE.  Prior to consideration for a potential long-term coumadinization and potential need for treatment of her moderately severe pulmonary hypertension, she is now referred for right and left heart catheterization in light of her recent chest pain, increasing shortness of breath symptomatology.  PROCEDURE:  Right femoral artery and femoral vein were punctured anteriorly and 5-French arterial and 7-French venous sheaths were inserted without difficulty.  The patient had received 2 mg Versed plus 25 mcg of fentanyl for conscious sedation.  Swan-Ganz catheter was inserted via the venous sheath and advanced to the RA, RV, PA, and PC positions.  A 0.025 wire was necessary to help stiffen the Swan-Ganz catheter so as to navigate into the RVOT and pulmonary artery.  Cardiac output was obtained by the thermodilution method.  O2 saturation was obtained in the pulmonary artery.  The pigtail catheter was then inserted via the femoral artery sheath and simultaneous AO, PA pressures were recorded.  Central aortic O2 saturation was obtained.  The catheter was passed into the left ventricle.  LV, PC  pressures were recorded. Biplane cine left ventriculography was performed.  The pigtail catheter was then pulled back into the AO.  Distal aortography was performed to make certain she did not have any renovascular abnormality or aortoiliac disease.  Attention was then directed at the coronary anatomy.  A 5- [place] FL-4 and 5-French FR-4 catheters were used for selective angiography into the left coronary system as well as the right coronary system.  Hemostasis was obtained by direct manual pressure.  The patient tolerated the procedure well and returned to room in satisfactory condition.  HEMODYNAMIC DATA:  Right atrial pressure mean 16, A-wave 20, RV pressure 54/15, PA pressure 50-54/20, pulmonary capillary wedge pressure 16-20.  Central aortic pressure 155/79.  Left ventricular pressure 155/17.  O2 saturation in the pulmonary artery was 63% and in the central aorta was 92%.  Cardiac output by the thermodilution method was 4.8, by the Fick method was 5.9 liters per minute.  Cardiac index was 2.2 and 2.6 liters per minute per meter squared, respectively.  Biplane cine left ventriculography revealed hyperdynamic LV function with an ejection fraction of at least 65%.  There were no focal segmental wall motion abnormalities.  There is no evidence for significant mitral regurgitation.  Distal aortography revealed patent renal arteries without significant aortoiliac disease.  ANGIOGRAPHIC DATA:  The  left main coronary artery was angiographically normal and bifurcated into an LAD and left circumflex system.  The LAD was angiographically normal and gave rise to several septal perforating arteries and 1 major diagonal vessel.  The circumflex vessel was angiographically normal, gave rise to 3 marginal vessels.  The right coronary artery was angiographically normal and gave rise to a large PDA system and small posterolateral vessel.  IMPRESSION: 1. Normal hyperdynamic left  ventricular function. 2. Elevation of right heart pressures with moderate pulmonary     hypertension with right ventricle systolic and PA pressures ranging     now at 50-54 mm systolically. 3. Normal coronary arteries. 4. No evidence for significant aortoiliac disease or renovascular     hypertension.          ______________________________ Nicki Guadalajara, M.D.     TK/MEDQ  D:  12/28/2010  T:  12/29/2010  Job:  [id number]  cc:   Thurmon Fair, MD [place] Hilty, MD  Electronically Signed by Nicki Guadalajara M.D. on 01/05/2011 03:13:17 PM

## 2011-01-10 ENCOUNTER — Encounter: Payer: Self-pay | Admitting: Pulmonary Disease

## 2011-01-11 ENCOUNTER — Other Ambulatory Visit: Payer: Medicare Other

## 2011-01-11 ENCOUNTER — Ambulatory Visit (INDEPENDENT_AMBULATORY_CARE_PROVIDER_SITE_OTHER): Payer: Medicare Other | Admitting: Pulmonary Disease

## 2011-01-11 ENCOUNTER — Encounter: Payer: Self-pay | Admitting: Pulmonary Disease

## 2011-01-11 VITALS — BP 120/80 | HR 66 | Temp 98.0°F | Ht 67.0 in | Wt 244.0 lb

## 2011-01-11 DIAGNOSIS — I272 Pulmonary hypertension, unspecified: Secondary | ICD-10-CM

## 2011-01-11 DIAGNOSIS — I2789 Other specified pulmonary heart diseases: Secondary | ICD-10-CM

## 2011-01-11 NOTE — Patient Instructions (Signed)
Will check bloodwork today for autoimmune disease Will schedule for breathing tests Will check blood oxygen level while sleeping one night.  Will schedule for lung scan to look for persistent clots in lungs. Will arrange followup to discuss results once they are available.

## 2011-01-11 NOTE — Progress Notes (Signed)
Subjective:    Patient ID: [name], female    DOB: 1945-09-13, 65 y.o.   MRN: [id number]  HPI The pt is a 65y/o female who I have been asked to see for evaluation of pulmonary hypertension.  The pt had an episode of dvt and PE back in 2004 after a surgical procedure, and tells me she was treated with coumadin for one year.  At that time, her hypercoagulable w/u was negative except for a + lupus anticoagulant noted on bloodwork, and she was followed by Dr. Donnie Coffin for a period of time.  Lab review from 10/2003 shows her lupus anticoagulant was then negative.  The pt had an apparent echo in 2008 which showed moderate pulmonary htn, then a f/u in 09/2009 which revealed nl LV fxn, mildly dilated RV with normal function, mildly dilated LA, and moderately elevated PA pressures that were unchanged from 2008.  Recent echo done was very similar to that from 2011.  She recently was admitted to the hospital for chest pain and worsening sob.  Left heart cath showed normal coronaries.  Her right heart cath showed RA 16,RV 54/15,PA 50/20, PCWP 16-20, CO 4.8 by thermo, mean PA 30, and SVR calculated to 2.5 wood units.  During the hospitalization off coumadin her ESR nl, protein C nl, protein S level normal but activity decreased?, neg anticardiolipin Ab, and lupus anticoagulant was not detected.  Her PTT mixing study was very abnormal however.  She has had a ct chest which showed no parenchymal lung disease or other abnormality, but has not had a V/Q scan.  She has no h/o autoimmune disease, but her mother did have VTE.  She has had a sleep study that I am told was not significant.  She does describe currently a 2-3 block doe, and will get winded bringing groceries in from the car.  She does not feel her sob impacts her QOL, but admits that she has become much more sedentary.  She denies any significant cough or mucus.  She does have intermittant pedal edema by history.  Her weight is down 10 pounds in the last one year per  her history.  She has not had a recent LE venous doppler exam.     Review of Systems  Constitutional: Positive for unexpected weight change. Negative for fever.  HENT: Positive for ear pain and congestion. Negative for nosebleeds, sore throat, rhinorrhea, sneezing, trouble swallowing, dental problem, postnasal drip and sinus pressure.   Eyes: Negative for redness and itching.  Respiratory: Positive for shortness of breath. Negative for cough, chest tightness and wheezing.   Cardiovascular: Positive for chest pain and leg swelling. Negative for palpitations.  Gastrointestinal: Positive for abdominal pain. Negative for nausea and vomiting.  Genitourinary: Negative for dysuria.  Musculoskeletal: Positive for joint swelling.  Skin: Negative for rash.  Neurological: Positive for headaches.  Hematological: Does not bruise/bleed easily.  Psychiatric/Behavioral: Positive for dysphoric mood. The patient is nervous/anxious.        Objective:   Physical Exam Constitutional: morbidly obese female, no acute distress  HENT:  Nares patent without discharge, turbinate hypertrophy noted  Oropharynx without exudate, palate and uvula are normal  Eyes:  Perrla, eomi, no scleral icterus  Neck:  No JVD, no TMG  Cardiovascular:  Normal rate, regular rhythm, no rubs or gallops.  No murmurs        Intact distal pulses  Pulmonary :  Normal breath sounds, no stridor or respiratory distress   No rales, rhonchi, or wheezing  Abdominal:  Soft, nondistended, bowel sounds present.  No tenderness noted.   Musculoskeletal: 1+ lower extremity edema noted.  Lymph Nodes:  No cervical lymphadenopathy noted  Skin:  No cyanosis noted  Neurologic:  Alert, appropriate, moves all 4 extremities without obvious deficit.         Assessment & Plan:

## 2011-01-11 NOTE — Discharge Summary (Signed)
NAMEANNASTON, [name]               ACCOUNT NO.:  [id number]  MEDICAL RECORD NO.:  [id number]  LOCATION:  3742                         FACILITY:  [facility]  PHYSICIAN:  Thurmon Fair, MD     DATE OF BIRTH:  04/24/1946  DATE OF ADMISSION:  12/27/2010 DATE OF DISCHARGE:  12/29/2010                              DISCHARGE SUMMARY   DISCHARGE DIAGNOSES: 1. Chest pain, pulmonary embolism and myocardial infarction ruled out. 2. Dyspnea on exertion, chronic. 3. Pulmonary hypertension. 4. History of pulmonary embolism in 2004. 5. History of migraines. 6. Anxiety and stress. 7. Past history of seizures, followed in the past by Dr. Anne Hahn. 8. Preserved left ventricular function.  HOSPITAL COURSE:  The patient is a 65 year old female, who saw Dr. Royann Shivers in the office recently.  She has a long history of pulmonary hypertension.  She has had a remote pulmonary embolism and was on Coumadin for a year.  At that time, she did have a positive lupus anticoagulant, she was seen by Dr. Donnie Coffin then.  She was seen in the office complaining of dyspnea on exertion and chest pain.  With some concern, this may have relation to situational stress, apparently the patient's mother recently died, she was supposed to go to [place] for funeral and wrap up her affairs.  The patient was set up to see a pulmonologist as an outpatient and also suggested to start Coumadin, but the patient declined to start it at this time.  She then presented to the emergency room December 27, 2010 with chest pain.  She was admitted to telemetry, started on heparin.  Troponins were negative x3.  CT angiogram was negative for pulmonary hypertension.  Coagulopathy studies were obtained and these were pending at the time of this dictation. Right and left heart cath was done December 28, 2010 by Dr. Tresa Endo, this revealed normal coronaries, normal LV function, normal renal arteries and normal iliacs.  PA pressures were elevated at 50-54.  The  patient was seen by Dr. Royann Shivers on the June 15.  He still suggest she take Coumadin, but at this time, the patient wants to wait until she sees her pulmonologist.  By that time, we should also have her coagulation studies back.  She will also see Dr. Royann Shivers back after her pulmonary visit.  We will see if we can move up her visit as it is for 3 weeks.  LABORATORY DATA:  White count 7.7, hemoglobin 12.3, hematocrit 36.3, platelets 237.  Sodium 139, potassium 4.9, BUN 12, creatinine 0.94. Liver functions were normal.  CK-MB and troponins were negative x3.  TSH 0.47.  Urinalysis unremarkable.  Telemetry shows sinus rhythm, EKG shows sinus rhythm without acute changes.  DISPOSITION:  The patient is discharged in stable condition.  She has been advised to start Coumadin, but she wants to wait until she sees her pulmonologist.     Abelino Derrick, P.A.   ______________________________ Thurmon Fair, MD    LKK/MEDQ  D:  12/29/2010  T:  12/30/2010  Job:  [id number]  cc:   Thurmon Fair, MD Barbaraann Share, MD,FCCP Electronically Signed by Corine Shelter P.A. on 01/04/2011 02:13:03 PM Electronically Signed by Valora Piccolo.D.  on 01/11/2011 04:25:41 PM

## 2011-01-12 LAB — ANA: Anti Nuclear Antibody(ANA): NEGATIVE

## 2011-01-12 LAB — RHEUMATOID FACTOR: Rhuematoid fact SerPl-aCnc: <10 IU/mL (ref <=14)

## 2011-01-12 LAB — FACTOR 5 LEIDEN

## 2011-01-12 LAB — ANTI-SCLERODERMA ANTIBODY: Scleroderma (Scl-70) (ENA) Antibody, IgG: 36 AU/mL — ABNORMAL HIGH (ref <30)

## 2011-01-12 LAB — ANTI-DNA ANTIBODY, DOUBLE-STRANDED: ds DNA Ab: 2 IU/mL (ref <30)

## 2011-01-14 ENCOUNTER — Encounter: Payer: Self-pay | Admitting: Pulmonary Disease

## 2011-01-14 NOTE — Assessment & Plan Note (Signed)
The pt has been found to have a mild to moderately elevated mean PA pressure by right heart cath, and only a mild increase in SVR at 2.5 wood units.  Some of her increased PA pressure is from the arterial side, given her PCWP of 16-20 (this is not normal).  I suspect she has diastolic dysfunction contributing to this.  She does have a h/o VTE, and makes me wonder if she may have CTEPH?  The pt has had a +hypercoagulable workup in the past, and then later was negative.  Most recently is negative, although her mixing studies are not normal.  I think it would be worthwhile to get her back to Dr. Donnie Coffin for re-evaluation.  The questions for me at this point are does she have evidence for chronic thromboembolic disease, does she need to be on chronic anticoagulation for life, and finally is there some kind of intervention that is likely to result in decreased pulmonary pressures and enhanced exertional tolerance.  At this point, will check a v/q scan, but may need a pulmonary angiogram before it is all said and done.  Will check labwork for autoimmune disease, ONO on room air, full pfts, and get the results of her sleep study in the past.  I think her PCWP is abnormal, and that she more than likely has an element of diastolic dysfunction.  She would probably benefit from diuresis or further treatment.  Will leave that to cardiology.

## 2011-01-16 ENCOUNTER — Ambulatory Visit (HOSPITAL_COMMUNITY)
Admission: RE | Admit: 2011-01-16 | Discharge: 2011-01-16 | Disposition: A | Discharge status: Home / Self Care | Payer: Federal, State, Local not specified - PPO | Source: Ambulatory Visit | Attending: Pulmonary Disease | Admitting: Pulmonary Disease

## 2011-01-16 ENCOUNTER — Encounter (HOSPITAL_COMMUNITY): Payer: Self-pay

## 2011-01-16 ENCOUNTER — Encounter (HOSPITAL_COMMUNITY)
Admission: RE | Admit: 2011-01-16 | Discharge: 2011-01-16 | Disposition: A | Discharge status: Home / Self Care | Payer: Federal, State, Local not specified - PPO | Source: Ambulatory Visit | Attending: Pulmonary Disease | Admitting: Pulmonary Disease

## 2011-01-16 ENCOUNTER — Other Ambulatory Visit: Payer: Self-pay | Admitting: Pulmonary Disease

## 2011-01-16 DIAGNOSIS — R0602 Shortness of breath: Secondary | ICD-10-CM | POA: Insufficient documentation

## 2011-01-16 DIAGNOSIS — I2789 Other specified pulmonary heart diseases: Secondary | ICD-10-CM | POA: Insufficient documentation

## 2011-01-16 DIAGNOSIS — Z86718 Personal history of other venous thrombosis and embolism: Secondary | ICD-10-CM | POA: Insufficient documentation

## 2011-01-16 DIAGNOSIS — I272 Pulmonary hypertension, unspecified: Secondary | ICD-10-CM

## 2011-01-16 HISTORY — DX: Pulmonary hypertension, unspecified: I27.20

## 2011-01-16 MED ORDER — TECHNETIUM TO 99M ALBUMIN AGGREGATED
5.4000 | Freq: Once | INTRAVENOUS | Status: AC | PRN
  Administered 2011-01-16: 5.4 via INTRAVENOUS

## 2011-01-16 MED ORDER — XENON XE 133 GAS
10.6000 | GAS_FOR_INHALATION | Freq: Once | RESPIRATORY_TRACT | Status: AC | PRN
  Administered 2011-01-16: 10.6 via RESPIRATORY_TRACT

## 2011-01-19 NOTE — H&P (Signed)
[name], [name]               ACCOUNT NO.:  [id number]  MEDICAL RECORD NO.:  [id number]  LOCATION:  [facility]                         FACILITY:  [facility]  PHYSICIAN:  [place] Hilty, MD         DATE OF BIRTH:  [birth year]  DATE OF ADMISSION:  12/27/2010 DATE OF DISCHARGE:                             HISTORY & PHYSICAL   CHIEF COMPLAINTS:  Shortness of breath and chest pain.  HISTORY OF PRESENT ILLNESS:  The patient is a 65 year old female with a history of pulmonary hypertension and remote pulmonary embolism.  She has just saw Dr. Royann Shivers in the office December 22, 2010.  Please refer to his note for complete details.  He feels she has New York Heart Association functional class II to III dyspnea.  When he saw her in the office on December 22, 2010, he suggested she probably needs to be on lifelong anticoagulation.  He also suggested further pulmonary evaluation including high resolution CT angio and pulmonary function studies to see if she may benefit from direct vasodilator therapy.  She has had a negative sleep study in the past.  The patient did not start to Coumadin yet.  She has to go back to [place] for the funeral of her mother who recently passed.  This morning, she developed some midsternal chest pain and some throat discomfort associated with some shortness of breath.  She also associates this with a headache.  She took a breathing treatment at home with marginal relief.  She presented to the emergency room for further evaluation.  Her EKG in the emergency room shows a nonspecific ST changes.  She is admitted now for further evaluation.  Past medical history is remarkable for DVT and pulmonary embolism in 2004, after multiple lower extremity orthopedic procedures.  The patient has a history of "asthma."  She has a past history of seizures, she has seen Dr. Anne Hahn in the past.  She has Gastroesophageal reflux.  She is allergic to PENICILLIN and DILANTIN.  CURRENT MEDICATIONS: 1.  Aspirin 325 daily. 2. AcipHex 20 mg b.i.d. 3. Tylenol with Codeine p.r.n. 4. Zantac 300 mg at bedtime. 5. Multivitamin. 6. Singulair 10 mg a day. 7. Clarinex 5 mg a day. 8. Advair 500/50 b.i.d. 9. Albuterol p.r.n. 10.P.r.n. migraine medicines.  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  She never smoked.  Family history is remarkable for hypertension.  Her mother just passed recently.  REVIEW OF SYSTEMS:  The patient admits to being under great deal of stress after the death of her mother.  Previous echocardiogram was done in 2011, showed good LV function with PA pressures greater than 60. Myoview done in 2007, was negative for significant ischemia.  PHYSICAL EXAMINATION:  VITAL SIGNS:  Blood pressure 125/69, pulse 86, temperature 98.3, O2 sats 100% on 2 liters. GENERAL:  She is a well-developed, well-nourished African American female in no acute distress. HEENT:  Normocephalic, atraumatic.  Extraocular movements intact. Sclerae nonicteric.  She wears glasses. NECK:  Without JVD or bruits. CHEST:  Clear to auscultation and percussion. CARDIAC:  Regular rate and rhythm without obvious murmur, rub or gallop. There  is a prominent S2. EXTREMITIES:  No clubbing or cyanosis with 2+ distal pulses.  There is no edema. NEURO:  Grossly intact.  She is awake, alert, oriented, and cooperative. Moves all extremities without obvious deficit.  LABORATORY DATA:  EKG shows sinus rhythm with occasional PAC, nonspecific ST changes.  Labs are pending.  IMPRESSION: 1. Chest pain with dyspnea, rule out pulmonary embolism versus anxiety     versus unstable angina. 2. Known pulmonary hypertension after pulmonary embolism in 2004,     following orthopedic surgery. 3. History of migraines. 4. Situational stress and anxiety secondary to the death of her mother     recently.  PLAN:  The patient seen by Dr. Rennis Golden and myself.  She will be admitted to telemetry.   We will go ahead and cycle her cardiac enzymes and get a CT of her chest.  She did have lower extremity venous Dopplers done in 2009, that were negative for DVT.  Further evaluation will be pending the above test.     Abelino Derrick, P.A.   ______________________________ [place] Hilty, MD    LKK/MEDQ  D:  12/27/2010  T:  12/27/2010  Job:  [id number]  cc:   Merlene Laughter. Renae Gloss, M.D. Thurmon Fair, MD  Electronically Signed by Corine Shelter P.A. on 01/15/2011 11:02:28 AM Electronically Signed by Kirtland Bouchard. HILTY M.D. on 01/19/2011 08:20:47 AM

## 2011-01-22 ENCOUNTER — Telehealth: Payer: Self-pay | Admitting: Pulmonary Disease

## 2011-01-22 NOTE — Telephone Encounter (Signed)
Spoke to [facility] they will check the oximetry company and find out why pt has not been called and will also call pt back

## 2011-01-24 ENCOUNTER — Telehealth: Payer: Self-pay | Admitting: Pulmonary Disease

## 2011-01-24 ENCOUNTER — Ambulatory Visit (INDEPENDENT_AMBULATORY_CARE_PROVIDER_SITE_OTHER): Payer: Medicare Other | Admitting: Pulmonary Disease

## 2011-01-24 DIAGNOSIS — I272 Pulmonary hypertension, unspecified: Secondary | ICD-10-CM

## 2011-01-24 DIAGNOSIS — I2789 Other specified pulmonary heart diseases: Secondary | ICD-10-CM

## 2011-01-24 DIAGNOSIS — J4540 Moderate persistent asthma, uncomplicated: Secondary | ICD-10-CM | POA: Insufficient documentation

## 2011-01-24 LAB — PULMONARY FUNCTION TEST

## 2011-01-24 NOTE — Telephone Encounter (Signed)
Received records from [facility] office.  History documented in problem list

## 2011-01-24 NOTE — Progress Notes (Signed)
PFT done today. 

## 2011-01-29 ENCOUNTER — Encounter: Payer: Self-pay | Admitting: Pulmonary Disease

## 2011-01-29 ENCOUNTER — Ambulatory Visit (INDEPENDENT_AMBULATORY_CARE_PROVIDER_SITE_OTHER): Payer: Medicare Other | Admitting: Pulmonary Disease

## 2011-01-29 DIAGNOSIS — I272 Pulmonary hypertension, unspecified: Secondary | ICD-10-CM

## 2011-01-29 DIAGNOSIS — I2789 Other specified pulmonary heart diseases: Secondary | ICD-10-CM

## 2011-01-29 DIAGNOSIS — D6859 Other primary thrombophilia: Secondary | ICD-10-CM | POA: Insufficient documentation

## 2011-01-29 NOTE — Progress Notes (Signed)
  Subjective:    Patient ID: [name], female    DOB: October 18, 1945, 65 y.o.   MRN: [id number]  HPI The pt comes in today for f/u of her recent testing as part of a w/u for pulmonary htn.  Her ONO was unremarkable, and her PFT's showed minimal restriction and mild decrease in DLCO.  Her bloodwork for auto immune disease was also unimpressive.  I have reviewed all of the results with her.  Please see A/P in this note for more details.    Review of Systems  Constitutional: Negative for fever and unexpected weight change.  HENT: Negative for ear pain, nosebleeds, congestion, sore throat, rhinorrhea, sneezing, trouble swallowing, dental problem, postnasal drip and sinus pressure.   Eyes: Negative for redness and itching.  Respiratory: Negative for cough, chest tightness, shortness of breath and wheezing.   Cardiovascular: Negative for palpitations and leg swelling.  Gastrointestinal: Negative for nausea and vomiting.  Genitourinary: Negative for dysuria.  Musculoskeletal: Negative for joint swelling.  Skin: Negative for rash.  Neurological: Negative for headaches.  Hematological: Bruises/bleeds easily.  Psychiatric/Behavioral: Positive for dysphoric mood. The patient is nervous/anxious.        Objective:   Physical Exam Obese female in nad Nares with no obvious discharge or purulence Chest clear to auscultation Cor with rrr LE with mild LE edema, no cyanosis Alert, oriented, moves all 4        Assessment & Plan:

## 2011-01-29 NOTE — Patient Instructions (Signed)
Will refer to Dr. Donnie Coffin to look at your labwork and determine if you have a hypercoagulable state or not.  If so, will need chronic coumadin Work on weight loss You do not require oxygen while sleeping. Would recommend getting back to your primary md or cardiologist to consider fluid medication.   You will need an echo yearly to followup your pulmonary hypertension.  Will leave this to your cardiologist.

## 2011-01-30 ENCOUNTER — Telehealth: Payer: Self-pay | Admitting: Pulmonary Disease

## 2011-01-30 NOTE — Telephone Encounter (Addendum)
Pt calling to let [facility] know that Dr. Donnie Coffin is out of the office and she is unsure if he will be back anytime soon. She has an appt with Dr[facility]lyn Rong [facility]urs[facility] 7/19. Is this okay? Pls advise.  I have left a msg with Dr. Renelda Loma nurse so we can get additional information regarding the status of Dr. Renelda Loma return to the office.

## 2011-01-30 NOTE — Telephone Encounter (Signed)
Ok with me, if ok with pt.

## 2011-01-30 NOTE — Telephone Encounter (Signed)
Pt is fine with seeing Dr. Gaylyn Rong on Thurs., 7/19.

## 2011-01-31 ENCOUNTER — Encounter: Payer: Self-pay | Admitting: Pulmonary Disease

## 2011-01-31 NOTE — Assessment & Plan Note (Signed)
The pt has a h/o +lupus anticoagulant, but when rechecked in past was negative.  Her most recent labwork was also negative, but her mixing study appears abnormal to me.  I think she needs re-evaluation by hematology, and if felt to be hypercoagulable, obviously needs to be on chronic coumadin.

## 2011-01-31 NOTE — Assessment & Plan Note (Addendum)
Work up to date includes:  Echo 2008 with moderate pulm htn, f/u 2011 with no change.  Ct chest 12/2010: unremarkable V/Q 2012 with no chronic VTE RHC 2012: mean PA 30, PCWP 16-20, PVR 2.5 wood units DSDNA, ANA, RF all normal, FACTOR 5 neg, SCL70 minimally elevated at 36 (normal < 30) Negative sleep study per pt ONO with low sat 81%, and only 16 sec less than 88% PFT's 2012:  No obstruction, essentially no restriction (TLC 79%), DLCO mildly reduced at 70%  The pt has only mild pulmonary htn on the basis of her mean PA and her calculated PVR.  She has no specific etiology for this currently accept her elevated PCWP.  I think she may have an element of diastolic dysfunction, and would benefit from diuretic therapy given her PCWP.  There is no indication currently for vasodilator therapy, but she does need yearly echo to evaluate for worsening of her PA pressures.  I have also encouraged her to work aggressively on weight loss.

## 2011-02-01 ENCOUNTER — Other Ambulatory Visit: Payer: Self-pay | Admitting: Oncology

## 2011-02-01 ENCOUNTER — Encounter (HOSPITAL_BASED_OUTPATIENT_CLINIC_OR_DEPARTMENT_OTHER): Payer: Medicare Other | Admitting: Oncology

## 2011-02-01 DIAGNOSIS — I749 Embolism and thrombosis of unspecified artery: Secondary | ICD-10-CM

## 2011-02-01 LAB — CBC WITH DIFFERENTIAL/PLATELET
BASO%: 0.4 % (ref 0.0–2.0)
Basophils Absolute: 0 10*3/uL (ref 0.0–0.1)
EOS%: 1.3 % (ref 0.0–7.0)
Eosinophils Absolute: 0.1 10*3/uL (ref 0.0–0.5)
HCT: 38.8 % (ref 34.8–46.6)
HGB: 12.9 g/dL (ref 11.6–15.9)
LYMPH%: 26.4 % (ref 14.0–49.7)
MCH: 26.6 pg (ref 25.1–34.0)
MCHC: 33.2 g/dL (ref 31.5–36.0)
MCV: 80.2 fL (ref 79.5–101.0)
MONO#: 0.6 10*3/uL (ref 0.1–0.9)
MONO%: 7.9 % (ref 0.0–14.0)
NEUT#: 4.6 10*3/uL (ref 1.5–6.5)
NEUT%: 64 % (ref 38.4–76.8)
Platelets: 267 10*3/uL (ref 145–400)
RBC: 4.84 10*6/uL (ref 3.70–5.45)
RDW: 15.4 % — ABNORMAL HIGH (ref 11.2–14.5)
WBC: 7.2 10*3/uL (ref 3.9–10.3)
lymph#: 1.9 10*3/uL (ref 0.9–3.3)

## 2011-02-06 ENCOUNTER — Telehealth: Payer: Self-pay | Admitting: Pulmonary Disease

## 2011-02-06 LAB — BETA-2 GLYCOPROTEIN ANTIBODIES
Beta-2 Glyco I IgG: 0 G Units (ref <20)
Beta-2-Glycoprotein I IgA: 6 A Units (ref <20)
Beta-2-Glycoprotein I IgM: 1 M Units (ref <20)

## 2011-02-06 NOTE — Telephone Encounter (Signed)
[email or web address]  

## 2011-02-06 NOTE — Telephone Encounter (Signed)
Spoke with pt. She states that she was asked by [facility] to call and speak with her PCP or Cards regarding fluid retention. She states that her cards doc is out of the country for a few months and her PCP will not call her back. She states still feels like she is retaining fluid. I advised that she needs appt to see one of them, and needs to call the cards office back and sched appt with whomever is covering for her regular doc, or make appt with PCP rather than just leaving a msg to have them call her back. Pt verbalized understanding.

## 2011-02-06 NOTE — Telephone Encounter (Signed)
[phone] pt return call [name]

## 2011-02-12 ENCOUNTER — Telehealth: Payer: Self-pay | Admitting: Pulmonary Disease

## 2011-02-12 NOTE — Telephone Encounter (Signed)
Called and spoke with pt. Pt states she saw Dr. Gaylyn Rong recently and is also scheduled to see cards tomorrow to discuss fluid meds.  Pt is just wanting to know if she needs to f/u with Dr. Shelle Iron.  KC, please advise.  Thanks.

## 2011-02-13 NOTE — Telephone Encounter (Signed)
Please let her know there are no pulmonary issues to address currently.  Dr. Gaylyn Rong will address the thickness of her blood, and cardiology will address fluid issues and followup echo.  I would be happy to see her again if any of her other doctors think there is something new to address from pulmonary standpoint.

## 2011-02-14 NOTE — Telephone Encounter (Signed)
LMOMTCB x 1 

## 2011-02-14 NOTE — Telephone Encounter (Signed)
Pt returned call.  Advised of KC's recs.  Pt verbalized her understanding.

## 2011-02-21 ENCOUNTER — Ambulatory Visit: Payer: Medicare Other | Admitting: Pulmonary Disease

## 2011-03-09 ENCOUNTER — Encounter: Payer: Medicare Other | Attending: Internal Medicine | Admitting: Dietician

## 2011-03-09 VITALS — Ht 66.0 in | Wt 234.3 lb

## 2011-03-09 DIAGNOSIS — I2789 Other specified pulmonary heart diseases: Secondary | ICD-10-CM | POA: Insufficient documentation

## 2011-03-09 DIAGNOSIS — E119 Type 2 diabetes mellitus without complications: Secondary | ICD-10-CM | POA: Insufficient documentation

## 2011-03-09 DIAGNOSIS — Z713 Dietary counseling and surveillance: Secondary | ICD-10-CM | POA: Insufficient documentation

## 2011-03-09 DIAGNOSIS — I272 Pulmonary hypertension, unspecified: Secondary | ICD-10-CM

## 2011-03-09 NOTE — Progress Notes (Signed)
  Medical Nutrition Therapy:  Appt start time: 0900 end time:  1000.   Assessment:  Primary concerns today:Returns to touch base regarding getting back into better eating habits that will facilitate continued weight loss.  Seeking information regarding potassium and sodium food sources.  In June of 2012 was hospitalized with heart failure on the right side.  Found to have peripheral edema.  She was attended by cardiology and worked-up for cardiac issues at that time.  She currently is taking Lasix 40 mg daily.  She is unsure of the foods to replace the potassium loss with this medication.  She has questions regarding sodium content of foods and methods for limiting sodium in her cooking.  She tends to prefer using fresh vegetables rather than canned.  The summer has been an emotional time for her with the loss of her mother and her illness, but she has lost 18.2 lb since 10/11/2010. She is pleased with her progress and wishes to continue.    MEDICATIONS:See the reviewed list.     DIETARY INTAKE:  Meal recall reveals that she may eat only 1-2 meals per day.  Breakfast can be at 2:00 pm and consist of a 1 1/2 cup of cereal and 1/3 cup of Lactaid milk.  Later in the evening, she may have a meat, a non-starchy vegetable and a starch.   On reviewing the higher potassium vegetable and fruit sources, she was able to identify a number that she enjoys.  Progress Towards Goal(s):  In progress.   Nutritional Diagnosis:  [place]-2.1 Inpaired nutrition utilization As related to glucose, sodium and potassium.  As evidenced by the diagnosis of type 2 diabetes, new diagnosis of heart failure and use of a diruetic.    Intervention:  Nutrition Review of food sources with increased potassium and sodium levels. Established a goal of 2000 mg of potassium and sodium each day.  Handouts given during visit include:  Food Sources for potassium (vegetables and fruits)  Food Sources for sodium along with the Novonordisk Carb  Counting Booklet with the food label and recommendations for sodium intake with different food groups/servings.  Monitoring/Evaluation:  Dietary intake, exercise, blood glucose , and body weight follow-up in the next 3-6 months, she is to call.

## 2011-03-09 NOTE — Patient Instructions (Signed)
   With water pill you want to replace the potassium.  Aim for 2000 mg each day. Use fruit or vegetables that are in season.  Use the frozen more often.  Rinse and drain your canned vegetables.  For salad dressings and mayo, use the regular or the lite products, read the food label and use the serving size on the label.  Measure using the label.  Avoid the fat free (have added sugar and salt).  Use the softer cheeses, they have a little less salt and more fat.  Go for less salt and less fat.  Continue to chew. Juice has little or no fiber and you tend to drink more when you do not chew.  Try to get in the habit of trying to eat more regularly.  Continue to work at the weight loss.  Use the potassium food and sodium food handouts.  Aim to keep the level of sodium at 500 mg per meal (3 x 500 mg) and 500 mg total for snacks.  Aim for using your bike.  Ride for 5 minutes then rest, come back later and ride for 5 minutes.  Set a goal for 20-30 minutes doing a day.Do in divided sessions.  Work your self up over time.  Make your own salad dressing with oil and vinegar and herbs, garlic, onion.  Consider using a margarine that has lower salt content.

## 2011-04-04 ENCOUNTER — Telehealth: Payer: Self-pay | Admitting: Pulmonary Disease

## 2011-04-04 NOTE — Telephone Encounter (Signed)
[email or web address]  

## 2011-04-05 NOTE — Telephone Encounter (Signed)
Patient calling back.  [phone]

## 2011-04-05 NOTE — Telephone Encounter (Signed)
I do not remember discussing this with her, and it is unlikely that I did.  Nevertheless, I see no reason she cannot take tylenol 3 for pain.  She should discuss this with her primary md anyway.

## 2011-04-05 NOTE — Telephone Encounter (Signed)
[email or web address]  

## 2011-04-05 NOTE — Telephone Encounter (Signed)
I spoke with pt and she is aware of kc's response

## 2011-04-05 NOTE — Telephone Encounter (Signed)
I spoke with pt and she states that at her last OV in July Dr. Shelle Iron advised her to stop taking tylenol #3 and just take regular tylenol. Pt states she doesn't understand why. She states she takes this for her back pain and for her knee problems. Pt states she was not explained why she needed to stop this. I don't see where this was advised for her to stop. Please advise Dr. Shelle Iron. Thanks  Carver Fila, CMA

## 2011-04-10 LAB — URINE CULTURE

## 2011-04-10 LAB — DIFFERENTIAL
Basophils Absolute: 0.1
Basophils Relative: 1
Eosinophils Absolute: 0.2
Eosinophils Relative: 2
Lymphocytes Relative: 35
Lymphs Abs: 3.3
Monocytes Absolute: 0.7
Monocytes Relative: 8
Neutro Abs: 5.1
Neutrophils Relative %: 55

## 2011-04-10 LAB — PROTIME-INR
INR: 1.1
Prothrombin Time: 14.8

## 2011-04-10 LAB — URINALYSIS, ROUTINE W REFLEX MICROSCOPIC
Bilirubin Urine: NEGATIVE
Glucose, UA: NEGATIVE
Hgb urine dipstick: NEGATIVE
Ketones, ur: NEGATIVE
Nitrite: NEGATIVE
Protein, ur: NEGATIVE
Specific Gravity, Urine: 1.012
Urobilinogen, UA: 0.2
pH: 5.5

## 2011-04-10 LAB — POCT I-STAT, CHEM 8
BUN: 17
Calcium, Ion: 1.2
Chloride: 107
Creatinine, Ser: 1.3 — ABNORMAL HIGH
Glucose, Bld: 86
HCT: 37
Hemoglobin: 12.6
Potassium: 4.8
Sodium: 142
TCO2: 28

## 2011-04-10 LAB — CBC
HCT: 36.3
Hemoglobin: 12.3
MCHC: 33.9
MCV: 82.3
Platelets: 251
RBC: 4.41
RDW: 15.1
WBC: 9.4

## 2011-04-10 LAB — URINE MICROSCOPIC-ADD ON

## 2011-04-10 LAB — POCT CARDIAC MARKERS
CKMB, poc: 1.1
CKMB, poc: <1 — ABNORMAL LOW
Myoglobin, poc: 49.9
Myoglobin, poc: 72.3
Operator id: 285491
Operator id: 285491
Troponin i, poc: <0.05
Troponin i, poc: <0.05

## 2011-04-10 LAB — APTT: aPTT: 31

## 2011-08-03 DIAGNOSIS — J45909 Unspecified asthma, uncomplicated: Secondary | ICD-10-CM | POA: Diagnosis not present

## 2011-08-06 DIAGNOSIS — J45909 Unspecified asthma, uncomplicated: Secondary | ICD-10-CM | POA: Diagnosis not present

## 2011-08-28 DIAGNOSIS — R51 Headache: Secondary | ICD-10-CM | POA: Diagnosis not present

## 2011-08-28 DIAGNOSIS — G40109 Localization-related (focal) (partial) symptomatic epilepsy and epileptic syndromes with simple partial seizures, not intractable, without status epilepticus: Secondary | ICD-10-CM | POA: Diagnosis not present

## 2011-08-28 DIAGNOSIS — E1142 Type 2 diabetes mellitus with diabetic polyneuropathy: Secondary | ICD-10-CM | POA: Diagnosis not present

## 2011-08-28 DIAGNOSIS — D518 Other vitamin B12 deficiency anemias: Secondary | ICD-10-CM | POA: Diagnosis not present

## 2011-08-28 DIAGNOSIS — R6889 Other general symptoms and signs: Secondary | ICD-10-CM | POA: Diagnosis not present

## 2011-09-03 DIAGNOSIS — E1142 Type 2 diabetes mellitus with diabetic polyneuropathy: Secondary | ICD-10-CM | POA: Diagnosis not present

## 2011-09-17 DIAGNOSIS — F3289 Other specified depressive episodes: Secondary | ICD-10-CM | POA: Diagnosis not present

## 2011-09-17 DIAGNOSIS — I10 Essential (primary) hypertension: Secondary | ICD-10-CM | POA: Diagnosis not present

## 2011-09-17 DIAGNOSIS — D1800 Hemangioma unspecified site: Secondary | ICD-10-CM | POA: Diagnosis not present

## 2011-09-17 DIAGNOSIS — E119 Type 2 diabetes mellitus without complications: Secondary | ICD-10-CM | POA: Diagnosis not present

## 2011-09-17 DIAGNOSIS — E559 Vitamin D deficiency, unspecified: Secondary | ICD-10-CM | POA: Diagnosis not present

## 2011-09-17 DIAGNOSIS — F329 Major depressive disorder, single episode, unspecified: Secondary | ICD-10-CM | POA: Diagnosis not present

## 2011-09-26 DIAGNOSIS — I10 Essential (primary) hypertension: Secondary | ICD-10-CM | POA: Diagnosis not present

## 2011-09-26 DIAGNOSIS — J45909 Unspecified asthma, uncomplicated: Secondary | ICD-10-CM | POA: Diagnosis not present

## 2011-09-26 DIAGNOSIS — F3289 Other specified depressive episodes: Secondary | ICD-10-CM | POA: Diagnosis not present

## 2011-09-26 DIAGNOSIS — E119 Type 2 diabetes mellitus without complications: Secondary | ICD-10-CM | POA: Diagnosis not present

## 2011-09-26 DIAGNOSIS — R5381 Other malaise: Secondary | ICD-10-CM | POA: Diagnosis not present

## 2011-09-26 DIAGNOSIS — F329 Major depressive disorder, single episode, unspecified: Secondary | ICD-10-CM | POA: Diagnosis not present

## 2011-09-26 DIAGNOSIS — Z0000 Encounter for general adult medical examination without abnormal findings: Secondary | ICD-10-CM | POA: Diagnosis not present

## 2011-10-02 DIAGNOSIS — Z1231 Encounter for screening mammogram for malignant neoplasm of breast: Secondary | ICD-10-CM | POA: Diagnosis not present

## 2011-10-02 DIAGNOSIS — Z803 Family history of malignant neoplasm of breast: Secondary | ICD-10-CM | POA: Diagnosis not present

## 2011-10-02 DIAGNOSIS — J309 Allergic rhinitis, unspecified: Secondary | ICD-10-CM | POA: Diagnosis not present

## 2011-10-02 DIAGNOSIS — J45909 Unspecified asthma, uncomplicated: Secondary | ICD-10-CM | POA: Diagnosis not present

## 2011-10-03 DIAGNOSIS — J45909 Unspecified asthma, uncomplicated: Secondary | ICD-10-CM | POA: Diagnosis not present

## 2011-10-03 DIAGNOSIS — J309 Allergic rhinitis, unspecified: Secondary | ICD-10-CM | POA: Diagnosis not present

## 2011-10-04 DIAGNOSIS — N63 Unspecified lump in unspecified breast: Secondary | ICD-10-CM | POA: Diagnosis not present

## 2011-10-04 DIAGNOSIS — R928 Other abnormal and inconclusive findings on diagnostic imaging of breast: Secondary | ICD-10-CM | POA: Diagnosis not present

## 2011-10-07 DIAGNOSIS — J45909 Unspecified asthma, uncomplicated: Secondary | ICD-10-CM | POA: Diagnosis not present

## 2011-10-08 DIAGNOSIS — I270 Primary pulmonary hypertension: Secondary | ICD-10-CM | POA: Diagnosis not present

## 2011-10-08 HISTORY — PX: DOPPLER ECHOCARDIOGRAPHY: SHX263

## 2011-10-16 DIAGNOSIS — L905 Scar conditions and fibrosis of skin: Secondary | ICD-10-CM | POA: Diagnosis not present

## 2011-10-16 DIAGNOSIS — L299 Pruritus, unspecified: Secondary | ICD-10-CM | POA: Diagnosis not present

## 2011-10-23 DIAGNOSIS — F341 Dysthymic disorder: Secondary | ICD-10-CM | POA: Diagnosis not present

## 2011-10-23 DIAGNOSIS — H9319 Tinnitus, unspecified ear: Secondary | ICD-10-CM | POA: Diagnosis not present

## 2011-10-26 DIAGNOSIS — I2699 Other pulmonary embolism without acute cor pulmonale: Secondary | ICD-10-CM | POA: Diagnosis not present

## 2011-10-26 DIAGNOSIS — I270 Primary pulmonary hypertension: Secondary | ICD-10-CM | POA: Diagnosis not present

## 2011-11-02 DIAGNOSIS — J45909 Unspecified asthma, uncomplicated: Secondary | ICD-10-CM | POA: Diagnosis not present

## 2011-11-05 DIAGNOSIS — J45909 Unspecified asthma, uncomplicated: Secondary | ICD-10-CM | POA: Diagnosis not present

## 2011-11-07 DIAGNOSIS — F341 Dysthymic disorder: Secondary | ICD-10-CM | POA: Diagnosis not present

## 2011-11-13 DIAGNOSIS — F341 Dysthymic disorder: Secondary | ICD-10-CM | POA: Diagnosis not present

## 2011-11-30 DIAGNOSIS — H2510 Age-related nuclear cataract, unspecified eye: Secondary | ICD-10-CM | POA: Diagnosis not present

## 2011-11-30 DIAGNOSIS — H04129 Dry eye syndrome of unspecified lacrimal gland: Secondary | ICD-10-CM | POA: Diagnosis not present

## 2011-11-30 DIAGNOSIS — J45909 Unspecified asthma, uncomplicated: Secondary | ICD-10-CM | POA: Diagnosis not present

## 2011-11-30 DIAGNOSIS — H5200 Hypermetropia, unspecified eye: Secondary | ICD-10-CM | POA: Diagnosis not present

## 2011-11-30 DIAGNOSIS — H538 Other visual disturbances: Secondary | ICD-10-CM | POA: Diagnosis not present

## 2011-11-30 DIAGNOSIS — H43399 Other vitreous opacities, unspecified eye: Secondary | ICD-10-CM | POA: Diagnosis not present

## 2011-12-04 DIAGNOSIS — J45909 Unspecified asthma, uncomplicated: Secondary | ICD-10-CM | POA: Diagnosis not present

## 2011-12-18 DIAGNOSIS — J45909 Unspecified asthma, uncomplicated: Secondary | ICD-10-CM | POA: Diagnosis not present

## 2011-12-18 DIAGNOSIS — J309 Allergic rhinitis, unspecified: Secondary | ICD-10-CM | POA: Diagnosis not present

## 2011-12-27 DIAGNOSIS — R6889 Other general symptoms and signs: Secondary | ICD-10-CM | POA: Diagnosis not present

## 2011-12-27 DIAGNOSIS — M79609 Pain in unspecified limb: Secondary | ICD-10-CM | POA: Diagnosis not present

## 2011-12-27 DIAGNOSIS — G40109 Localization-related (focal) (partial) symptomatic epilepsy and epileptic syndromes with simple partial seizures, not intractable, without status epilepticus: Secondary | ICD-10-CM | POA: Diagnosis not present

## 2011-12-27 DIAGNOSIS — R51 Headache: Secondary | ICD-10-CM | POA: Diagnosis not present

## 2012-01-01 DIAGNOSIS — J45909 Unspecified asthma, uncomplicated: Secondary | ICD-10-CM | POA: Diagnosis not present

## 2012-01-04 DIAGNOSIS — M216X9 Other acquired deformities of unspecified foot: Secondary | ICD-10-CM | POA: Diagnosis not present

## 2012-01-30 DIAGNOSIS — R1032 Left lower quadrant pain: Secondary | ICD-10-CM | POA: Diagnosis not present

## 2012-01-30 DIAGNOSIS — R319 Hematuria, unspecified: Secondary | ICD-10-CM | POA: Diagnosis not present

## 2012-02-01 ENCOUNTER — Emergency Department (HOSPITAL_COMMUNITY): Payer: Medicare Other

## 2012-02-01 ENCOUNTER — Emergency Department (INDEPENDENT_AMBULATORY_CARE_PROVIDER_SITE_OTHER)
Admission: EM | Admit: 2012-02-01 | Discharge: 2012-02-01 | Disposition: A | Discharge status: Home / Self Care | Payer: Medicare Other | Source: Home / Self Care | Attending: Emergency Medicine | Admitting: Emergency Medicine

## 2012-02-01 ENCOUNTER — Encounter (HOSPITAL_COMMUNITY): Payer: Self-pay

## 2012-02-01 ENCOUNTER — Emergency Department (HOSPITAL_COMMUNITY)
Admission: EM | Admit: 2012-02-01 | Discharge: 2012-02-01 | Disposition: A | Discharge status: Home / Self Care | Payer: Medicare Other | Attending: Emergency Medicine | Admitting: Emergency Medicine

## 2012-02-01 DIAGNOSIS — I10 Essential (primary) hypertension: Secondary | ICD-10-CM | POA: Insufficient documentation

## 2012-02-01 DIAGNOSIS — Z86718 Personal history of other venous thrombosis and embolism: Secondary | ICD-10-CM | POA: Insufficient documentation

## 2012-02-01 DIAGNOSIS — E119 Type 2 diabetes mellitus without complications: Secondary | ICD-10-CM | POA: Insufficient documentation

## 2012-02-01 DIAGNOSIS — Z79899 Other long term (current) drug therapy: Secondary | ICD-10-CM | POA: Insufficient documentation

## 2012-02-01 DIAGNOSIS — R109 Unspecified abdominal pain: Secondary | ICD-10-CM

## 2012-02-01 DIAGNOSIS — R1909 Other intra-abdominal and pelvic swelling, mass and lump: Secondary | ICD-10-CM | POA: Diagnosis not present

## 2012-02-01 LAB — CBC WITH DIFFERENTIAL/PLATELET
Basophils Absolute: 0 10*3/uL (ref 0.0–0.1)
Basophils Relative: 0 % (ref 0–1)
Eosinophils Absolute: 0.2 10*3/uL (ref 0.0–0.7)
Eosinophils Relative: 2 % (ref 0–5)
HCT: 37.5 % (ref 36.0–46.0)
Hemoglobin: 12.7 g/dL (ref 12.0–15.0)
Lymphocytes Relative: 41 % (ref 12–46)
Lymphs Abs: 2.8 10*3/uL (ref 0.7–4.0)
MCH: 26 pg (ref 26.0–34.0)
MCHC: 33.9 g/dL (ref 30.0–36.0)
MCV: 76.8 fL — ABNORMAL LOW (ref 78.0–100.0)
Monocytes Absolute: 0.6 10*3/uL (ref 0.1–1.0)
Monocytes Relative: 9 % (ref 3–12)
Neutro Abs: 3.4 10*3/uL (ref 1.7–7.7)
Neutrophils Relative %: 48 % (ref 43–77)
Platelets: 229 10*3/uL (ref 150–400)
RBC: 4.88 MIL/uL (ref 3.87–5.11)
RDW: 14.3 % (ref 11.5–15.5)
WBC: 7 10*3/uL (ref 4.0–10.5)

## 2012-02-01 LAB — URINALYSIS, ROUTINE W REFLEX MICROSCOPIC
Bilirubin Urine: NEGATIVE
Glucose, UA: NEGATIVE mg/dL
Hgb urine dipstick: NEGATIVE
Ketones, ur: NEGATIVE mg/dL
Leukocytes, UA: NEGATIVE
Nitrite: NEGATIVE
Protein, ur: NEGATIVE mg/dL
Specific Gravity, Urine: 1.012 (ref 1.005–1.030)
Urobilinogen, UA: 0.2 mg/dL (ref 0.0–1.0)
pH: 6 (ref 5.0–8.0)

## 2012-02-01 LAB — COMPREHENSIVE METABOLIC PANEL
ALT: 10 U/L (ref 0–35)
AST: 16 U/L (ref 0–37)
Albumin: 3.7 g/dL (ref 3.5–5.2)
Alkaline Phosphatase: 82 U/L (ref 39–117)
BUN: 12 mg/dL (ref 6–23)
CO2: 27 mEq/L (ref 19–32)
Calcium: 9.7 mg/dL (ref 8.4–10.5)
Chloride: 108 mEq/L (ref 96–112)
Creatinine, Ser: 0.79 mg/dL (ref 0.50–1.10)
GFR calc Af Amer: >90 mL/min (ref >90)
GFR calc non Af Amer: 86 mL/min — ABNORMAL LOW (ref >90)
Glucose, Bld: 103 mg/dL — ABNORMAL HIGH (ref 70–99)
Potassium: 4.8 mEq/L (ref 3.5–5.1)
Sodium: 143 mEq/L (ref 135–145)
Total Bilirubin: 0.6 mg/dL (ref 0.3–1.2)
Total Protein: 6.8 g/dL (ref 6.0–8.3)

## 2012-02-01 MED ORDER — MORPHINE SULFATE 4 MG/ML IJ SOLN
4.0000 mg | Freq: Once | INTRAMUSCULAR | Status: AC
  Administered 2012-02-01: 4 mg via INTRAVENOUS
  Filled 2012-02-01: qty 1

## 2012-02-01 MED ORDER — IOHEXOL 300 MG/ML  SOLN
100.0000 mL | Freq: Once | INTRAMUSCULAR | Status: AC | PRN
  Administered 2012-02-01: 100 mL via INTRAVENOUS

## 2012-02-01 MED ORDER — HYDROCODONE-ACETAMINOPHEN 5-500 MG PO TABS: 1.0000 | ORAL_TABLET | Freq: Four times a day (QID) | ORAL | Status: AC | PRN

## 2012-02-01 MED ORDER — ONDANSETRON HCL 4 MG/2ML IJ SOLN
INTRAMUSCULAR | Status: AC
  Filled 2012-02-01: qty 2

## 2012-02-01 MED ORDER — SODIUM CHLORIDE 0.9 % IV SOLN
INTRAVENOUS | Status: DC
  Administered 2012-02-01: 20:00:00 via INTRAVENOUS

## 2012-02-01 NOTE — ED Notes (Signed)
Pt c/o lower abdominal and groin pain. Pt stated that she went to her primary doctor Wednesday for current problem. Pt stated the doctor told her there was blood in her urine. Pt stated the PCP wanted her to have a CT scan of abdomen. She stated she has taken laxatives 1 a day for the past two days. She also stated that she administered and enema Wednesday. Pt stated that she had two small bowel movements since Wednesday. No c/o burning with urination.

## 2012-02-01 NOTE — ED Notes (Signed)
Pt transported to CT ?

## 2012-02-01 NOTE — ED Provider Notes (Signed)
History     CSN: [id number]  Arrival date & time 02/01/12  1157   First MD Initiated Contact with Patient 02/01/12 1424      Chief Complaint  Patient presents with  . Groin Pain  . Abdominal Pain    (Consider location/radiation/quality/duration/timing/severity/associated sxs/prior treatment) HPI Comments: Pt reports unchanging abd pain in BLQ since 7/16.  Saw pcp on 7/17, told had blood in urine but no infection.  Prior to pain onset, pt had not had a bowel movement in 2 days which is unusual for her.  Pain not improving despite taking laxatives yesterday and having bowel movement. Pain not improved with Tylenol #3.    Patient is a 66 y.o. female presenting with groin pain and abdominal pain. The history is provided by the patient.  Groin Pain This is a new problem. Episode onset: 3 days ago. The problem occurs constantly. The problem has not changed since onset.Associated symptoms include abdominal pain. Pertinent negatives include no chest pain. The symptoms are aggravated by walking (palpation). Nothing relieves the symptoms. Treatments tried: laxatives, tylenol #3. The treatment provided no relief.  Abdominal Pain The primary symptoms of the illness include abdominal pain. The primary symptoms of the illness do not include fever, nausea, vomiting, diarrhea, dysuria, vaginal discharge or vaginal bleeding.  Additional symptoms associated with the illness include constipation. Symptoms associated with the illness do not include chills, urgency, hematuria or back pain.    Past Medical History  Diagnosis Date  . Hypertension   . DVT (deep venous thrombosis)   . Pulmonary embolism   . Diabetes mellitus   . Seizures   . Allergic rhinitis   . Chronic headache   . Pulmonary hypertension     Past Surgical History  Procedure Date  . Knee surgery     right  . Plantar fascia surgery   . Partial hysterectomy   . Tonsillectomy     Family History  Problem Relation Age of Onset  .  Hypertension Mother   . Allergies Other     grandson  . Clotting disorder Mother   . Breast cancer Mother     History  Substance Use Topics  . Smoking status: Never Smoker   . Smokeless tobacco: Not on file  . Alcohol Use: Not on file    OB History    Grav Para Term Preterm Abortions TAB SAB Ect Mult Living                  Review of Systems  Constitutional: Negative for fever and chills.  Respiratory: Negative for cough.   Cardiovascular: Negative for chest pain.  Gastrointestinal: Positive for abdominal pain and constipation. Negative for nausea, vomiting, diarrhea, blood in stool and rectal pain.  Genitourinary: Negative for dysuria, urgency, hematuria, flank pain, vaginal bleeding, vaginal discharge and vaginal pain.       Pt told had blood in her urine on UA at Pcp's office, has not seen blood herself.   Musculoskeletal: Negative for back pain.  Skin: Negative for rash.    Allergies  Penicillins and Phenytoin sodium extended  Home Medications   Current Outpatient Rx  Name Route Sig Dispense Refill  . ACETAMINOPHEN 325 MG PO TABS Oral Take 650 mg by mouth every 4 (four) hours as needed. pain    . ALBUTEROL SULFATE HFA 108 (90 BASE) MCG/ACT IN AERS Inhalation Inhale 2 puffs into the lungs every 6 (six) hours as needed. Shortness of breath    . ALUMINUM-MAGNESIUM-SIMETHICONE 200-200-20  MG/5ML PO SUSP Oral Take 30 mLs by mouth as needed.      . ASPIRIN 81 MG PO TABS Oral Take 81 mg by mouth daily.      . BECLOMETHASONE DIPROPIONATE 80 MCG/ACT IN AERS Inhalation Inhale 2 puffs into the lungs 2 (two) times daily.      Marland Kitchen BIOTIN 1000 MCG PO TABS Oral Take 1,000 mcg by mouth daily.      Marland Kitchen BISACODYL 10 MG RE SUPP Rectal Place 10 mg rectally as needed. constipation    . DESLORATADINE 5 MG PO TABS Oral Take 5 mg by mouth daily.      Marland Kitchen FLUTICASONE-SALMETEROL 500-50 MCG/DOSE IN AEPB Inhalation Inhale 1 puff into the lungs 2 (two) times daily.      Marland Kitchen GARLIC PO Oral Take 1 capsule  by mouth daily.      Marland Kitchen GINKGO BILOBA 40 MG PO TABS Oral Take 1 tablet by mouth daily.      Marland Kitchen GLUCOSAMINE MAXIMUM STRENGTH PO Oral Take 1 tablet by mouth daily.      Marland Kitchen LEVETIRACETAM 500 MG PO TABS Oral Take 500 mg by mouth 2 (two) times daily.      . MOMETASONE FUROATE 50 MCG/ACT NA SUSP Nasal Place 2 sprays into the nose 2 (two) times daily.      Marland Kitchen MONTELUKAST SODIUM 10 MG PO TABS Oral Take 10 mg by mouth daily.      . OLOPATADINE HCL 0.1 % OP SOLN Both Eyes Place 1 drop into both eyes as needed.      Marland Kitchen RABEPRAZOLE SODIUM 20 MG PO TBEC Oral Take 20 mg by mouth 2 (two) times daily.      . SERTRALINE HCL 50 MG PO TABS Oral Take 50 mg by mouth daily.      . SUMATRIPTAN SUCCINATE 100 MG PO TABS Oral Take 100 mg by mouth 2 (two) times daily as needed. migraine    . VITAMIN E 1000 UNITS PO CAPS Oral Take 1,000 Units by mouth daily.        BP 127/68  Pulse 65  Temp 98.7 F (37.1 C) (Oral)  Resp 16  SpO2 100%  Physical Exam  Constitutional: She appears well-developed and well-nourished.       Uncomfortable appearing, does not appear ill  Cardiovascular: Normal rate and regular rhythm.   Pulmonary/Chest: Effort normal and breath sounds normal.  Abdominal: Bowel sounds are normal. She exhibits no distension. There is tenderness in the left lower quadrant. There is CVA tenderness. There is no rigidity, no rebound and no guarding.       Obese abd, large panus. L CVAT, tender to palp RLQ, suprapubic area, periumbilical area, but worst pain with palpation is in LLQ.    Skin: Skin is warm and dry. No rash noted.    ED Course  Procedures (including critical care time)  Labs Reviewed - No data to display No results found.   1. Abdominal pain       MDM  Gait slow, shuffling for pt due to abd pain that increases with walking.  No sign of infection.  Given pt's sx persisting for several days and unmanaged with pain medicine, transferring to ER.  Discussed with Dr. Lorenz Coaster.         Cathlyn Parsons, NP 02/01/12 [phone]

## 2012-02-01 NOTE — ED Notes (Signed)
Rx x 1, pt voiced undestanding to f/u with PCP on Monday and return with worsening sx

## 2012-02-01 NOTE — ED Notes (Signed)
Pt complains of low abd pain more on left than right.

## 2012-02-01 NOTE — ED Provider Notes (Signed)
Medical screening examination/treatment/procedure(s) were performed by non-physician practitioner and as supervising physician I was immediately available for consultation/collaboration.  Hurshell Dino, M.D.   Aliviyah Malanga C Omolola Mittman, MD 02/01/12 2101 

## 2012-02-01 NOTE — ED Provider Notes (Signed)
Medical screening examination/treatment/procedure(s) were performed by non-physician practitioner and as supervising physician I was immediately available for consultation/collaboration.   Aikeem Lilley, MD 02/01/12 2348 

## 2012-02-01 NOTE — ED Provider Notes (Signed)
66 y/o female with history significant for asthma, pulmonary HTN, and hypercoaguable state presents with LLQ and RLQ pain that began Tues. Weds she visited her primary care who believed that the symptoms may be urinary in origin. PCP UA: unremarkable. Physician recommended laxitives and enema for symptoms with out improvement. Patient states that the left sided pain is worse than the right. Patient states the pain is worse with walking and nothing makes it better. Denies fever, chills, night sweats. Denies SOB, CP or palpitations. Denies NVD, melena, or hematochezia. Denies abdominal surgeries other than a partial hysterectomy in the 70's  UA, CMP, CBC: Unremarkable  CT abdomen: negative. Patient discharged and care taken over by T. Kirenchenko. See her note for more pertinent details  Pixie Casino, Cordelia Poche 02/01/12 2301

## 2012-02-01 NOTE — ED Provider Notes (Signed)
History     CSN: [id number]  Arrival date & time 02/01/12  1449   First MD Initiated Contact with Patient 02/01/12 1916      Chief Complaint  Patient presents with  . Abdominal Pain  . Groin Pain    (Consider location/radiation/quality/duration/timing/severity/associated sxs/prior treatment) HPI Complains of lower abdominal pain and bilateral groin pain, left greater than right onset 2 days ago. Pain is worse with walking or changing positions. Patient went to her primary care Dr. 2 days ago for same complaint was treated with laxatives and enemas, without relief. Primary care Dr. told her that she may have blood in her urine however patient reports "the test came back negative" Last bowel movement 2 days ago, normal. No nausea or vomiting, no appetite change, pain is improved with remaining still. No other associated symptoms. Seen at urgent care Center earlier today sent here for further evaluation. Past Medical History  Diagnosis Date  . Hypertension   . DVT (deep venous thrombosis)   . Pulmonary embolism   . Diabetes mellitus   . Seizures   . Allergic rhinitis   . Chronic headache   . Pulmonary hypertension     Past Surgical History  Procedure Date  . Knee surgery     right  . Plantar fascia surgery   . Partial hysterectomy   . Tonsillectomy     Family History  Problem Relation Age of Onset  . Hypertension Mother   . Allergies Other     grandson  . Clotting disorder Mother   . Breast cancer Mother     History  Substance Use Topics  . Smoking status: Never Smoker   . Smokeless tobacco: Not on file  . Alcohol Use: Not on file    OB History    Grav Para Term Preterm Abortions TAB SAB Ect Mult Living                  Review of Systems  Constitutional: Negative.   HENT: Negative.   Respiratory: Negative.   Cardiovascular: Negative.   Gastrointestinal: Positive for abdominal pain.  Musculoskeletal: Negative.   Skin: Negative.   Neurological:  Negative.   Hematological: Negative.   Psychiatric/Behavioral: Negative.   All other systems reviewed and are negative.    Allergies  Penicillins and Phenytoin sodium extended  Home Medications   Current Outpatient Rx  Name Route Sig Dispense Refill  . ACETAMINOPHEN 325 MG PO TABS Oral Take 650 mg by mouth every 4 (four) hours as needed. pain    . ALBUTEROL SULFATE HFA 108 (90 BASE) MCG/ACT IN AERS Inhalation Inhale 2 puffs into the lungs every 6 (six) hours as needed. Shortness of breath    . ALUMINUM-MAGNESIUM-SIMETHICONE 200-200-20 MG/5ML PO SUSP Oral Take 30 mLs by mouth as needed.      . ASPIRIN 81 MG PO TABS Oral Take 81 mg by mouth daily.      . BECLOMETHASONE DIPROPIONATE 80 MCG/ACT IN AERS Inhalation Inhale 2 puffs into the lungs 2 (two) times daily.      Marland Kitchen BIOTIN 1000 MCG PO TABS Oral Take 1,000 mcg by mouth daily.      Marland Kitchen BISACODYL 10 MG RE SUPP Rectal Place 10 mg rectally as needed. constipation    . DESLORATADINE 5 MG PO TABS Oral Take 5 mg by mouth daily.      Marland Kitchen FLUTICASONE-SALMETEROL 500-50 MCG/DOSE IN AEPB Inhalation Inhale 1 puff into the lungs 2 (two) times daily.      Marland Kitchen  GARLIC PO Oral Take 1 capsule by mouth daily.      Marland Kitchen GINKGO BILOBA 40 MG PO TABS Oral Take 1 tablet by mouth daily.      Marland Kitchen GLUCOSAMINE MAXIMUM STRENGTH PO Oral Take 1 tablet by mouth daily.      Marland Kitchen LEVETIRACETAM 500 MG PO TABS Oral Take 500 mg by mouth 2 (two) times daily.      . MOMETASONE FUROATE 50 MCG/ACT NA SUSP Nasal Place 2 sprays into the nose 2 (two) times daily.      Marland Kitchen MONTELUKAST SODIUM 10 MG PO TABS Oral Take 10 mg by mouth daily.      . OLOPATADINE HCL 0.1 % OP SOLN Both Eyes Place 1 drop into both eyes as needed.      Marland Kitchen RABEPRAZOLE SODIUM 20 MG PO TBEC Oral Take 20 mg by mouth 2 (two) times daily.      . SERTRALINE HCL 50 MG PO TABS Oral Take 50 mg by mouth daily.      . SUMATRIPTAN SUCCINATE 100 MG PO TABS Oral Take 100 mg by mouth 2 (two) times daily as needed. migraine    . VITAMIN E  1000 UNITS PO CAPS Oral Take 1,000 Units by mouth daily.        BP 159/73  Pulse 101  Temp 97.6 F (36.4 C) (Oral)  Resp 20  SpO2 100%  Physical Exam  Nursing note and vitals reviewed. Constitutional: She appears well-developed and well-nourished.  HENT:  Head: Normocephalic and atraumatic.  Eyes: Conjunctivae are normal. Pupils are equal, round, and reactive to light.  Neck: Neck supple. No tracheal deviation present. No thyromegaly present.  Cardiovascular: Normal rate and regular rhythm.   No murmur heard. Pulmonary/Chest: Effort normal and breath sounds normal.  Abdominal: Soft. Bowel sounds are normal. She exhibits no distension. There is tenderness.       Morbidly obese, tender at suprapubic area left lower quadrant and right lower quadrant pain is exacerbated when patient stands or walks  Musculoskeletal: Normal range of motion. She exhibits no edema and no tenderness.  Neurological: She is alert. Coordination normal.  Skin: Skin is warm and dry. No rash noted.  Psychiatric: She has a normal mood and affect.    ED Course  Procedures (including critical care time)  Labs Reviewed  CBC WITH DIFFERENTIAL - Abnormal; Notable for the following:    MCV 76.8 (*)     All other components within normal limits  COMPREHENSIVE METABOLIC PANEL - Abnormal; Notable for the following:    Glucose, Bld 103 (*)     GFR calc non Af Amer 86 (*)     All other components within normal limits  URINALYSIS, ROUTINE W REFLEX MICROSCOPIC   No results found. .ed No diagnosis found.  Results for orders placed during the hospital encounter of 02/01/12  CBC WITH DIFFERENTIAL      Component Value Range   WBC 7.0  4.0 - 10.5 K/uL   RBC 4.88  3.87 - 5.11 MIL/uL   Hemoglobin 12.7  12.0 - 15.0 g/dL   HCT 47.8  29.5 - 62.1 %   MCV 76.8 (*) 78.0 - 100.0 fL   MCH 26.0  26.0 - 34.0 pg   MCHC 33.9  30.0 - 36.0 g/dL   RDW 30.8  65.7 - 84.6 %   Platelets 229  150 - 400 K/uL   Neutrophils Relative  48  43 - 77 %   Neutro Abs 3.4  1.7 -  7.7 K/uL   Lymphocytes Relative 41  12 - 46 %   Lymphs Abs 2.8  0.7 - 4.0 K/uL   Monocytes Relative 9  3 - 12 %   Monocytes Absolute 0.6  0.1 - 1.0 K/uL   Eosinophils Relative 2  0 - 5 %   Eosinophils Absolute 0.2  0.0 - 0.7 K/uL   Basophils Relative 0  0 - 1 %   Basophils Absolute 0.0  0.0 - 0.1 K/uL  COMPREHENSIVE METABOLIC PANEL      Component Value Range   Sodium 143  135 - 145 mEq/L   Potassium 4.8  3.5 - 5.1 mEq/L   Chloride 108  96 - 112 mEq/L   CO2 27  19 - 32 mEq/L   Glucose, Bld 103 (*) 70 - 99 mg/dL   BUN 12  6 - 23 mg/dL   Creatinine, Ser 5.62  0.50 - 1.10 mg/dL   Calcium 9.7  8.4 - 13.0 mg/dL   Total Protein 6.8  6.0 - 8.3 g/dL   Albumin 3.7  3.5 - 5.2 g/dL   AST 16  0 - 37 U/L   ALT 10  0 - 35 U/L   Alkaline Phosphatase 82  39 - 117 U/L   Total Bilirubin 0.6  0.3 - 1.2 mg/dL   GFR calc non Af Amer 86 (*) >90 mL/min   GFR calc Af Amer >90  >90 mL/min  URINALYSIS, ROUTINE W REFLEX MICROSCOPIC      Component Value Range   Color, Urine YELLOW  YELLOW   APPearance CLEAR  CLEAR   Specific Gravity, Urine 1.012  1.005 - 1.030   pH 6.0  5.0 - 8.0   Glucose, UA NEGATIVE  NEGATIVE mg/dL   Hgb urine dipstick NEGATIVE  NEGATIVE   Bilirubin Urine NEGATIVE  NEGATIVE   Ketones, ur NEGATIVE  NEGATIVE mg/dL   Protein, ur NEGATIVE  NEGATIVE mg/dL   Urobilinogen, UA 0.2  0.0 - 1.0 mg/dL   Nitrite NEGATIVE  NEGATIVE   Leukocytes, UA NEGATIVE  NEGATIVE   Ct Abdomen Pelvis W Contrast  02/01/2012  *RADIOLOGY REPORT*  Clinical Data: Bilateral groin pain, left greater than right. Nausea.  History of hysterectomy, asthma, seizures.  Borderline diabetes.  CT ABDOMEN AND PELVIS WITH CONTRAST  Technique:  Multidetector CT imaging of the abdomen and pelvis was performed following the standard protocol during bolus administration of intravenous contrast.  Contrast: OMNIPAQUE IOHEXOL 300 MG/ML  SOLN  Comparison: CT of the abdomen pelvis 06/25/2003,  pelvic ultrasound 06/27/2003  Findings: Images of the lung bases are unremarkable.  No focal abnormality identified within the liver, spleen, pancreas, adrenal glands, or kidneys.  The gallbladder is present.  The stomach and small bowel loops have a normal appearance. Colonic loops are normal in appearance.  The appendix is not well seen.  No inflammatory changes are identified to suggest acute appendicitis.  The uterus is absent.  Within the right adnexal region, there is a calcified mass.  This measures 3.0 by 2.2 x 4.5 cm. Previously, this measured 2.6 x 3.8 cm.  This appears to be anterior to the ovary based on previous exams and current exam.  No retroperitoneal or mesenteric adenopathy. No evidence for aortic aneurysm.  Degenerative changes are seen in the lower thoracic spine.  IMPRESSION:  1.  No evidence for acute abnormality of the abdomen or pelvis. 2.  No evidence for bowel obstruction. 3.  Right adnexal calcified mass may be slightly larger than  on the prior study.  This is favored to represent a broad ligament fibroid or mesenteric dermoid.  Original Report Authenticated By: Patterson Hammersmith, M.D.     MDM  Abdominal CT pending. Patient sent to CDU for continued observation, pain management and disposition  Diagnosis abdominal pain      Doug Sou, MD 02/02/12 1027

## 2012-02-04 ENCOUNTER — Encounter (HOSPITAL_COMMUNITY): Payer: Self-pay | Admitting: Emergency Medicine

## 2012-02-05 DIAGNOSIS — Z79899 Other long term (current) drug therapy: Secondary | ICD-10-CM | POA: Diagnosis not present

## 2012-02-05 DIAGNOSIS — M79609 Pain in unspecified limb: Secondary | ICD-10-CM | POA: Diagnosis not present

## 2012-02-05 DIAGNOSIS — R1031 Right lower quadrant pain: Secondary | ICD-10-CM | POA: Diagnosis not present

## 2012-02-06 DIAGNOSIS — M79609 Pain in unspecified limb: Secondary | ICD-10-CM | POA: Diagnosis not present

## 2012-02-06 DIAGNOSIS — Q828 Other specified congenital malformations of skin: Secondary | ICD-10-CM | POA: Diagnosis not present

## 2012-02-06 DIAGNOSIS — M2040 Other hammer toe(s) (acquired), unspecified foot: Secondary | ICD-10-CM | POA: Diagnosis not present

## 2012-02-12 ENCOUNTER — Other Ambulatory Visit: Payer: Self-pay | Admitting: Internal Medicine

## 2012-02-12 ENCOUNTER — Ambulatory Visit
Admission: RE | Admit: 2012-02-12 | Discharge: 2012-02-12 | Disposition: A | Discharge status: Home / Self Care | Payer: Medicare Other | Source: Ambulatory Visit | Attending: Internal Medicine | Admitting: Internal Medicine

## 2012-02-12 DIAGNOSIS — M25559 Pain in unspecified hip: Secondary | ICD-10-CM

## 2012-02-12 DIAGNOSIS — R109 Unspecified abdominal pain: Secondary | ICD-10-CM | POA: Diagnosis not present

## 2012-02-12 DIAGNOSIS — Z79899 Other long term (current) drug therapy: Secondary | ICD-10-CM | POA: Diagnosis not present

## 2012-02-12 DIAGNOSIS — M79609 Pain in unspecified limb: Secondary | ICD-10-CM | POA: Diagnosis not present

## 2012-02-12 DIAGNOSIS — N949 Unspecified condition associated with female genital organs and menstrual cycle: Secondary | ICD-10-CM | POA: Diagnosis not present

## 2012-02-15 DIAGNOSIS — J45909 Unspecified asthma, uncomplicated: Secondary | ICD-10-CM | POA: Diagnosis not present

## 2012-02-18 DIAGNOSIS — J45909 Unspecified asthma, uncomplicated: Secondary | ICD-10-CM | POA: Diagnosis not present

## 2012-02-19 DIAGNOSIS — M25559 Pain in unspecified hip: Secondary | ICD-10-CM | POA: Diagnosis not present

## 2012-02-22 DIAGNOSIS — M25559 Pain in unspecified hip: Secondary | ICD-10-CM | POA: Diagnosis not present

## 2012-02-26 DIAGNOSIS — M25559 Pain in unspecified hip: Secondary | ICD-10-CM | POA: Diagnosis not present

## 2012-02-28 DIAGNOSIS — M25559 Pain in unspecified hip: Secondary | ICD-10-CM | POA: Diagnosis not present

## 2012-03-04 DIAGNOSIS — M25559 Pain in unspecified hip: Secondary | ICD-10-CM | POA: Diagnosis not present

## 2012-03-10 DIAGNOSIS — J45909 Unspecified asthma, uncomplicated: Secondary | ICD-10-CM | POA: Diagnosis not present

## 2012-03-10 DIAGNOSIS — M2040 Other hammer toe(s) (acquired), unspecified foot: Secondary | ICD-10-CM | POA: Diagnosis not present

## 2012-03-18 DIAGNOSIS — J45909 Unspecified asthma, uncomplicated: Secondary | ICD-10-CM | POA: Diagnosis not present

## 2012-03-18 DIAGNOSIS — M2040 Other hammer toe(s) (acquired), unspecified foot: Secondary | ICD-10-CM | POA: Diagnosis not present

## 2012-03-19 DIAGNOSIS — J45909 Unspecified asthma, uncomplicated: Secondary | ICD-10-CM | POA: Diagnosis not present

## 2012-04-03 DIAGNOSIS — I10 Essential (primary) hypertension: Secondary | ICD-10-CM | POA: Diagnosis not present

## 2012-04-03 DIAGNOSIS — J45909 Unspecified asthma, uncomplicated: Secondary | ICD-10-CM | POA: Diagnosis not present

## 2012-04-03 DIAGNOSIS — Z79899 Other long term (current) drug therapy: Secondary | ICD-10-CM | POA: Diagnosis not present

## 2012-04-03 DIAGNOSIS — Z23 Encounter for immunization: Secondary | ICD-10-CM | POA: Diagnosis not present

## 2012-04-03 DIAGNOSIS — E119 Type 2 diabetes mellitus without complications: Secondary | ICD-10-CM | POA: Diagnosis not present

## 2012-04-07 DIAGNOSIS — M2040 Other hammer toe(s) (acquired), unspecified foot: Secondary | ICD-10-CM | POA: Diagnosis not present

## 2012-04-18 DIAGNOSIS — M2040 Other hammer toe(s) (acquired), unspecified foot: Secondary | ICD-10-CM | POA: Diagnosis not present

## 2012-04-18 DIAGNOSIS — J45909 Unspecified asthma, uncomplicated: Secondary | ICD-10-CM | POA: Diagnosis not present

## 2012-04-22 DIAGNOSIS — J45909 Unspecified asthma, uncomplicated: Secondary | ICD-10-CM | POA: Diagnosis not present

## 2012-05-21 DIAGNOSIS — R609 Edema, unspecified: Secondary | ICD-10-CM | POA: Diagnosis not present

## 2012-05-22 DIAGNOSIS — J45909 Unspecified asthma, uncomplicated: Secondary | ICD-10-CM | POA: Diagnosis not present

## 2012-05-23 DIAGNOSIS — J45909 Unspecified asthma, uncomplicated: Secondary | ICD-10-CM | POA: Diagnosis not present

## 2012-05-29 DIAGNOSIS — G4700 Insomnia, unspecified: Secondary | ICD-10-CM | POA: Diagnosis not present

## 2012-05-29 DIAGNOSIS — Z79899 Other long term (current) drug therapy: Secondary | ICD-10-CM | POA: Diagnosis not present

## 2012-05-29 DIAGNOSIS — H9319 Tinnitus, unspecified ear: Secondary | ICD-10-CM | POA: Diagnosis not present

## 2012-05-29 DIAGNOSIS — R252 Cramp and spasm: Secondary | ICD-10-CM | POA: Diagnosis not present

## 2012-06-20 DIAGNOSIS — J45909 Unspecified asthma, uncomplicated: Secondary | ICD-10-CM | POA: Diagnosis not present

## 2012-06-24 DIAGNOSIS — J45909 Unspecified asthma, uncomplicated: Secondary | ICD-10-CM | POA: Diagnosis not present

## 2012-06-27 DIAGNOSIS — G40109 Localization-related (focal) (partial) symptomatic epilepsy and epileptic syndromes with simple partial seizures, not intractable, without status epilepticus: Secondary | ICD-10-CM | POA: Diagnosis not present

## 2012-06-27 DIAGNOSIS — M79609 Pain in unspecified limb: Secondary | ICD-10-CM | POA: Diagnosis not present

## 2012-06-27 DIAGNOSIS — E1142 Type 2 diabetes mellitus with diabetic polyneuropathy: Secondary | ICD-10-CM | POA: Diagnosis not present

## 2012-06-27 DIAGNOSIS — R51 Headache: Secondary | ICD-10-CM | POA: Diagnosis not present

## 2012-07-11 DIAGNOSIS — J45901 Unspecified asthma with (acute) exacerbation: Secondary | ICD-10-CM | POA: Diagnosis not present

## 2012-07-24 DIAGNOSIS — R928 Other abnormal and inconclusive findings on diagnostic imaging of breast: Secondary | ICD-10-CM | POA: Diagnosis not present

## 2012-07-24 DIAGNOSIS — Z09 Encounter for follow-up examination after completed treatment for conditions other than malignant neoplasm: Secondary | ICD-10-CM | POA: Diagnosis not present

## 2012-07-29 DIAGNOSIS — J309 Allergic rhinitis, unspecified: Secondary | ICD-10-CM | POA: Diagnosis not present

## 2012-07-29 DIAGNOSIS — M2040 Other hammer toe(s) (acquired), unspecified foot: Secondary | ICD-10-CM | POA: Diagnosis not present

## 2012-07-29 DIAGNOSIS — J329 Chronic sinusitis, unspecified: Secondary | ICD-10-CM | POA: Diagnosis not present

## 2012-07-29 DIAGNOSIS — J45909 Unspecified asthma, uncomplicated: Secondary | ICD-10-CM | POA: Diagnosis not present

## 2012-08-08 DIAGNOSIS — Z1211 Encounter for screening for malignant neoplasm of colon: Secondary | ICD-10-CM | POA: Diagnosis not present

## 2012-08-08 LAB — HM COLONOSCOPY

## 2012-08-19 DIAGNOSIS — J45909 Unspecified asthma, uncomplicated: Secondary | ICD-10-CM | POA: Diagnosis not present

## 2012-09-02 DIAGNOSIS — J45909 Unspecified asthma, uncomplicated: Secondary | ICD-10-CM | POA: Diagnosis not present

## 2012-09-03 DIAGNOSIS — J45909 Unspecified asthma, uncomplicated: Secondary | ICD-10-CM | POA: Diagnosis not present

## 2012-09-08 DIAGNOSIS — J45901 Unspecified asthma with (acute) exacerbation: Secondary | ICD-10-CM | POA: Diagnosis not present

## 2012-09-09 DIAGNOSIS — G589 Mononeuropathy, unspecified: Secondary | ICD-10-CM | POA: Diagnosis not present

## 2012-09-09 DIAGNOSIS — G609 Hereditary and idiopathic neuropathy, unspecified: Secondary | ICD-10-CM | POA: Diagnosis not present

## 2012-09-16 DIAGNOSIS — J45901 Unspecified asthma with (acute) exacerbation: Secondary | ICD-10-CM | POA: Diagnosis not present

## 2012-10-03 DIAGNOSIS — J45909 Unspecified asthma, uncomplicated: Secondary | ICD-10-CM | POA: Diagnosis not present

## 2012-10-06 DIAGNOSIS — J45909 Unspecified asthma, uncomplicated: Secondary | ICD-10-CM | POA: Diagnosis not present

## 2012-10-13 DIAGNOSIS — J3089 Other allergic rhinitis: Secondary | ICD-10-CM | POA: Diagnosis not present

## 2012-10-13 DIAGNOSIS — E119 Type 2 diabetes mellitus without complications: Secondary | ICD-10-CM | POA: Diagnosis not present

## 2012-10-13 DIAGNOSIS — E559 Vitamin D deficiency, unspecified: Secondary | ICD-10-CM | POA: Diagnosis not present

## 2012-10-13 DIAGNOSIS — G4700 Insomnia, unspecified: Secondary | ICD-10-CM | POA: Diagnosis not present

## 2012-10-13 DIAGNOSIS — R635 Abnormal weight gain: Secondary | ICD-10-CM | POA: Diagnosis not present

## 2012-10-13 DIAGNOSIS — I10 Essential (primary) hypertension: Secondary | ICD-10-CM | POA: Diagnosis not present

## 2012-10-15 DIAGNOSIS — I10 Essential (primary) hypertension: Secondary | ICD-10-CM | POA: Diagnosis not present

## 2012-10-15 DIAGNOSIS — E559 Vitamin D deficiency, unspecified: Secondary | ICD-10-CM | POA: Diagnosis not present

## 2012-10-15 DIAGNOSIS — R635 Abnormal weight gain: Secondary | ICD-10-CM | POA: Diagnosis not present

## 2012-10-15 DIAGNOSIS — E119 Type 2 diabetes mellitus without complications: Secondary | ICD-10-CM | POA: Diagnosis not present

## 2012-10-16 DIAGNOSIS — L819 Disorder of pigmentation, unspecified: Secondary | ICD-10-CM | POA: Diagnosis not present

## 2012-10-21 DIAGNOSIS — J309 Allergic rhinitis, unspecified: Secondary | ICD-10-CM | POA: Diagnosis not present

## 2012-10-21 DIAGNOSIS — J45909 Unspecified asthma, uncomplicated: Secondary | ICD-10-CM | POA: Diagnosis not present

## 2012-11-07 DIAGNOSIS — J45909 Unspecified asthma, uncomplicated: Secondary | ICD-10-CM | POA: Diagnosis not present

## 2012-11-07 DIAGNOSIS — I270 Primary pulmonary hypertension: Secondary | ICD-10-CM | POA: Diagnosis not present

## 2012-11-10 ENCOUNTER — Other Ambulatory Visit (HOSPITAL_COMMUNITY): Payer: Self-pay | Admitting: Cardiovascular Disease

## 2012-11-10 DIAGNOSIS — R011 Cardiac murmur, unspecified: Secondary | ICD-10-CM

## 2012-11-10 DIAGNOSIS — R0602 Shortness of breath: Secondary | ICD-10-CM

## 2012-11-14 ENCOUNTER — Ambulatory Visit (HOSPITAL_COMMUNITY): Payer: Medicare Other

## 2012-11-14 DIAGNOSIS — J45909 Unspecified asthma, uncomplicated: Secondary | ICD-10-CM | POA: Diagnosis not present

## 2012-11-20 ENCOUNTER — Ambulatory Visit (HOSPITAL_COMMUNITY)
Admission: RE | Admit: 2012-11-20 | Discharge: 2012-11-20 | Disposition: A | Discharge status: Home / Self Care | Payer: Medicare Other | Source: Ambulatory Visit | Attending: Cardiovascular Disease | Admitting: Cardiovascular Disease

## 2012-11-20 DIAGNOSIS — R0602 Shortness of breath: Secondary | ICD-10-CM | POA: Diagnosis not present

## 2012-11-20 DIAGNOSIS — I059 Rheumatic mitral valve disease, unspecified: Secondary | ICD-10-CM | POA: Insufficient documentation

## 2012-11-20 DIAGNOSIS — I079 Rheumatic tricuspid valve disease, unspecified: Secondary | ICD-10-CM | POA: Insufficient documentation

## 2012-11-20 DIAGNOSIS — Z86711 Personal history of pulmonary embolism: Secondary | ICD-10-CM | POA: Diagnosis not present

## 2012-11-20 DIAGNOSIS — R011 Cardiac murmur, unspecified: Secondary | ICD-10-CM | POA: Diagnosis not present

## 2012-11-20 DIAGNOSIS — I2789 Other specified pulmonary heart diseases: Secondary | ICD-10-CM | POA: Diagnosis not present

## 2012-11-20 NOTE — Progress Notes (Signed)
[facility]   2D echo completed 11/20/2012.   Cindy Maevyn Riordan, RDCS  

## 2012-11-23 ENCOUNTER — Encounter: Payer: Self-pay | Admitting: *Deleted

## 2012-12-01 DIAGNOSIS — H43399 Other vitreous opacities, unspecified eye: Secondary | ICD-10-CM | POA: Diagnosis not present

## 2012-12-01 DIAGNOSIS — H35039 Hypertensive retinopathy, unspecified eye: Secondary | ICD-10-CM | POA: Diagnosis not present

## 2012-12-01 DIAGNOSIS — H5200 Hypermetropia, unspecified eye: Secondary | ICD-10-CM | POA: Diagnosis not present

## 2012-12-01 DIAGNOSIS — H1045 Other chronic allergic conjunctivitis: Secondary | ICD-10-CM | POA: Diagnosis not present

## 2012-12-01 DIAGNOSIS — J45909 Unspecified asthma, uncomplicated: Secondary | ICD-10-CM | POA: Diagnosis not present

## 2012-12-01 DIAGNOSIS — H2510 Age-related nuclear cataract, unspecified eye: Secondary | ICD-10-CM | POA: Diagnosis not present

## 2012-12-01 DIAGNOSIS — H35019 Changes in retinal vascular appearance, unspecified eye: Secondary | ICD-10-CM | POA: Diagnosis not present

## 2012-12-08 DIAGNOSIS — J45909 Unspecified asthma, uncomplicated: Secondary | ICD-10-CM | POA: Diagnosis not present

## 2012-12-09 ENCOUNTER — Telehealth: Payer: Self-pay | Admitting: *Deleted

## 2012-12-09 DIAGNOSIS — J45909 Unspecified asthma, uncomplicated: Secondary | ICD-10-CM | POA: Diagnosis not present

## 2012-12-09 DIAGNOSIS — J309 Allergic rhinitis, unspecified: Secondary | ICD-10-CM | POA: Diagnosis not present

## 2012-12-09 NOTE — Telephone Encounter (Signed)
Results of echo called to pt - no changes from previous echo

## 2012-12-11 DIAGNOSIS — Z79899 Other long term (current) drug therapy: Secondary | ICD-10-CM | POA: Diagnosis not present

## 2012-12-11 DIAGNOSIS — Z6841 Body Mass Index (BMI) 40.0 and over, adult: Secondary | ICD-10-CM | POA: Diagnosis not present

## 2012-12-11 DIAGNOSIS — Z7182 Exercise counseling: Secondary | ICD-10-CM | POA: Diagnosis not present

## 2013-01-02 DIAGNOSIS — J45909 Unspecified asthma, uncomplicated: Secondary | ICD-10-CM | POA: Diagnosis not present

## 2013-01-05 DIAGNOSIS — J45909 Unspecified asthma, uncomplicated: Secondary | ICD-10-CM | POA: Diagnosis not present

## 2013-01-07 ENCOUNTER — Encounter: Payer: Self-pay | Admitting: Cardiovascular Disease

## 2013-01-30 DIAGNOSIS — J45909 Unspecified asthma, uncomplicated: Secondary | ICD-10-CM | POA: Diagnosis not present

## 2013-01-30 DIAGNOSIS — J309 Allergic rhinitis, unspecified: Secondary | ICD-10-CM | POA: Diagnosis not present

## 2013-02-03 DIAGNOSIS — H905 Unspecified sensorineural hearing loss: Secondary | ICD-10-CM | POA: Diagnosis not present

## 2013-02-03 DIAGNOSIS — H9319 Tinnitus, unspecified ear: Secondary | ICD-10-CM | POA: Diagnosis not present

## 2013-02-05 DIAGNOSIS — I10 Essential (primary) hypertension: Secondary | ICD-10-CM | POA: Diagnosis not present

## 2013-02-05 DIAGNOSIS — Z79899 Other long term (current) drug therapy: Secondary | ICD-10-CM | POA: Diagnosis not present

## 2013-02-05 DIAGNOSIS — F3289 Other specified depressive episodes: Secondary | ICD-10-CM | POA: Diagnosis not present

## 2013-02-05 DIAGNOSIS — F329 Major depressive disorder, single episode, unspecified: Secondary | ICD-10-CM | POA: Diagnosis not present

## 2013-02-05 DIAGNOSIS — R635 Abnormal weight gain: Secondary | ICD-10-CM | POA: Diagnosis not present

## 2013-02-10 DIAGNOSIS — L738 Other specified follicular disorders: Secondary | ICD-10-CM | POA: Diagnosis not present

## 2013-02-10 DIAGNOSIS — D235 Other benign neoplasm of skin of trunk: Secondary | ICD-10-CM | POA: Diagnosis not present

## 2013-02-13 DIAGNOSIS — J45909 Unspecified asthma, uncomplicated: Secondary | ICD-10-CM | POA: Diagnosis not present

## 2013-02-24 DIAGNOSIS — L723 Sebaceous cyst: Secondary | ICD-10-CM | POA: Diagnosis not present

## 2013-02-24 DIAGNOSIS — D485 Neoplasm of uncertain behavior of skin: Secondary | ICD-10-CM | POA: Diagnosis not present

## 2013-02-24 DIAGNOSIS — J45909 Unspecified asthma, uncomplicated: Secondary | ICD-10-CM | POA: Diagnosis not present

## 2013-02-24 DIAGNOSIS — L738 Other specified follicular disorders: Secondary | ICD-10-CM | POA: Diagnosis not present

## 2013-02-25 DIAGNOSIS — J45909 Unspecified asthma, uncomplicated: Secondary | ICD-10-CM | POA: Diagnosis not present

## 2013-02-26 DIAGNOSIS — M79609 Pain in unspecified limb: Secondary | ICD-10-CM | POA: Diagnosis not present

## 2013-02-26 DIAGNOSIS — L600 Ingrowing nail: Secondary | ICD-10-CM | POA: Diagnosis not present

## 2013-02-26 DIAGNOSIS — M21619 Bunion of unspecified foot: Secondary | ICD-10-CM | POA: Diagnosis not present

## 2013-03-05 DIAGNOSIS — M779 Enthesopathy, unspecified: Secondary | ICD-10-CM | POA: Diagnosis not present

## 2013-03-05 DIAGNOSIS — M25579 Pain in unspecified ankle and joints of unspecified foot: Secondary | ICD-10-CM | POA: Diagnosis not present

## 2013-03-19 DIAGNOSIS — M65979 Unspecified synovitis and tenosynovitis, unspecified ankle and foot: Secondary | ICD-10-CM | POA: Diagnosis not present

## 2013-03-19 DIAGNOSIS — G5750 Tarsal tunnel syndrome, unspecified lower limb: Secondary | ICD-10-CM | POA: Diagnosis not present

## 2013-03-19 DIAGNOSIS — L97509 Non-pressure chronic ulcer of other part of unspecified foot with unspecified severity: Secondary | ICD-10-CM | POA: Diagnosis not present

## 2013-03-19 DIAGNOSIS — M659 Synovitis and tenosynovitis, unspecified: Secondary | ICD-10-CM | POA: Diagnosis not present

## 2013-03-19 DIAGNOSIS — T8189XA Other complications of procedures, not elsewhere classified, initial encounter: Secondary | ICD-10-CM | POA: Diagnosis not present

## 2013-03-26 DIAGNOSIS — IMO0002 Reserved for concepts with insufficient information to code with codable children: Secondary | ICD-10-CM | POA: Diagnosis not present

## 2013-03-26 DIAGNOSIS — G5750 Tarsal tunnel syndrome, unspecified lower limb: Secondary | ICD-10-CM | POA: Diagnosis not present

## 2013-03-26 DIAGNOSIS — M7150 Other bursitis, not elsewhere classified, unspecified site: Secondary | ICD-10-CM | POA: Diagnosis not present

## 2013-03-27 DIAGNOSIS — J45909 Unspecified asthma, uncomplicated: Secondary | ICD-10-CM | POA: Diagnosis not present

## 2013-03-27 DIAGNOSIS — J309 Allergic rhinitis, unspecified: Secondary | ICD-10-CM | POA: Diagnosis not present

## 2013-03-30 DIAGNOSIS — J309 Allergic rhinitis, unspecified: Secondary | ICD-10-CM | POA: Diagnosis not present

## 2013-03-30 DIAGNOSIS — J45909 Unspecified asthma, uncomplicated: Secondary | ICD-10-CM | POA: Diagnosis not present

## 2013-04-02 DIAGNOSIS — IMO0002 Reserved for concepts with insufficient information to code with codable children: Secondary | ICD-10-CM | POA: Diagnosis not present

## 2013-04-08 ENCOUNTER — Other Ambulatory Visit: Payer: Self-pay | Admitting: Cardiovascular Disease

## 2013-04-08 NOTE — Telephone Encounter (Signed)
Rx was sent to pharmacy electronically. 

## 2013-04-16 DIAGNOSIS — IMO0002 Reserved for concepts with insufficient information to code with codable children: Secondary | ICD-10-CM | POA: Diagnosis not present

## 2013-04-16 DIAGNOSIS — R209 Unspecified disturbances of skin sensation: Secondary | ICD-10-CM | POA: Diagnosis not present

## 2013-04-16 DIAGNOSIS — R569 Unspecified convulsions: Secondary | ICD-10-CM | POA: Diagnosis not present

## 2013-04-16 DIAGNOSIS — G5610 Other lesions of median nerve, unspecified upper limb: Secondary | ICD-10-CM | POA: Diagnosis not present

## 2013-04-16 DIAGNOSIS — R634 Abnormal weight loss: Secondary | ICD-10-CM | POA: Diagnosis not present

## 2013-04-16 DIAGNOSIS — M5412 Radiculopathy, cervical region: Secondary | ICD-10-CM | POA: Diagnosis not present

## 2013-04-16 DIAGNOSIS — R7301 Impaired fasting glucose: Secondary | ICD-10-CM | POA: Diagnosis not present

## 2013-04-16 DIAGNOSIS — G609 Hereditary and idiopathic neuropathy, unspecified: Secondary | ICD-10-CM | POA: Diagnosis not present

## 2013-04-21 DIAGNOSIS — M79609 Pain in unspecified limb: Secondary | ICD-10-CM | POA: Diagnosis not present

## 2013-04-23 DIAGNOSIS — M79609 Pain in unspecified limb: Secondary | ICD-10-CM | POA: Diagnosis not present

## 2013-04-24 DIAGNOSIS — J45909 Unspecified asthma, uncomplicated: Secondary | ICD-10-CM | POA: Diagnosis not present

## 2013-04-27 DIAGNOSIS — J45909 Unspecified asthma, uncomplicated: Secondary | ICD-10-CM | POA: Diagnosis not present

## 2013-04-30 DIAGNOSIS — M79609 Pain in unspecified limb: Secondary | ICD-10-CM | POA: Diagnosis not present

## 2013-05-05 DIAGNOSIS — L905 Scar conditions and fibrosis of skin: Secondary | ICD-10-CM | POA: Diagnosis not present

## 2013-05-05 DIAGNOSIS — M79609 Pain in unspecified limb: Secondary | ICD-10-CM | POA: Diagnosis not present

## 2013-05-07 DIAGNOSIS — M79609 Pain in unspecified limb: Secondary | ICD-10-CM | POA: Diagnosis not present

## 2013-05-11 DIAGNOSIS — E119 Type 2 diabetes mellitus without complications: Secondary | ICD-10-CM | POA: Diagnosis not present

## 2013-05-11 DIAGNOSIS — I10 Essential (primary) hypertension: Secondary | ICD-10-CM | POA: Diagnosis not present

## 2013-05-11 DIAGNOSIS — J45909 Unspecified asthma, uncomplicated: Secondary | ICD-10-CM | POA: Diagnosis not present

## 2013-05-21 DIAGNOSIS — L97509 Non-pressure chronic ulcer of other part of unspecified foot with unspecified severity: Secondary | ICD-10-CM | POA: Diagnosis not present

## 2013-06-18 DIAGNOSIS — M79609 Pain in unspecified limb: Secondary | ICD-10-CM | POA: Diagnosis not present

## 2013-06-18 DIAGNOSIS — M21619 Bunion of unspecified foot: Secondary | ICD-10-CM | POA: Diagnosis not present

## 2013-06-18 DIAGNOSIS — D2370 Other benign neoplasm of skin of unspecified lower limb, including hip: Secondary | ICD-10-CM | POA: Diagnosis not present

## 2013-06-18 DIAGNOSIS — L97509 Non-pressure chronic ulcer of other part of unspecified foot with unspecified severity: Secondary | ICD-10-CM | POA: Diagnosis not present

## 2013-06-19 DIAGNOSIS — J45909 Unspecified asthma, uncomplicated: Secondary | ICD-10-CM | POA: Diagnosis not present

## 2013-06-19 DIAGNOSIS — J309 Allergic rhinitis, unspecified: Secondary | ICD-10-CM | POA: Diagnosis not present

## 2013-06-23 DIAGNOSIS — J45909 Unspecified asthma, uncomplicated: Secondary | ICD-10-CM | POA: Diagnosis not present

## 2013-06-23 DIAGNOSIS — M79609 Pain in unspecified limb: Secondary | ICD-10-CM | POA: Diagnosis not present

## 2013-06-24 DIAGNOSIS — J45909 Unspecified asthma, uncomplicated: Secondary | ICD-10-CM | POA: Diagnosis not present

## 2013-06-25 DIAGNOSIS — M79609 Pain in unspecified limb: Secondary | ICD-10-CM | POA: Diagnosis not present

## 2013-06-26 ENCOUNTER — Ambulatory Visit: Payer: Self-pay | Admitting: Neurology

## 2013-06-26 ENCOUNTER — Telehealth: Payer: Self-pay | Admitting: Neurology

## 2013-06-26 DIAGNOSIS — G609 Hereditary and idiopathic neuropathy, unspecified: Secondary | ICD-10-CM | POA: Diagnosis not present

## 2013-06-26 DIAGNOSIS — G5610 Other lesions of median nerve, unspecified upper limb: Secondary | ICD-10-CM | POA: Diagnosis not present

## 2013-06-26 DIAGNOSIS — IMO0002 Reserved for concepts with insufficient information to code with codable children: Secondary | ICD-10-CM | POA: Diagnosis not present

## 2013-06-26 DIAGNOSIS — R569 Unspecified convulsions: Secondary | ICD-10-CM | POA: Diagnosis not present

## 2013-06-26 DIAGNOSIS — M542 Cervicalgia: Secondary | ICD-10-CM | POA: Diagnosis not present

## 2013-06-26 NOTE — Telephone Encounter (Signed)
This patient did not show for the revisit appointment today. 

## 2013-06-30 DIAGNOSIS — M79609 Pain in unspecified limb: Secondary | ICD-10-CM | POA: Diagnosis not present

## 2013-07-02 DIAGNOSIS — M79609 Pain in unspecified limb: Secondary | ICD-10-CM | POA: Diagnosis not present

## 2013-07-21 DIAGNOSIS — M79609 Pain in unspecified limb: Secondary | ICD-10-CM | POA: Diagnosis not present

## 2013-07-21 DIAGNOSIS — J45909 Unspecified asthma, uncomplicated: Secondary | ICD-10-CM | POA: Diagnosis not present

## 2013-07-24 DIAGNOSIS — J45909 Unspecified asthma, uncomplicated: Secondary | ICD-10-CM | POA: Diagnosis not present

## 2013-07-28 DIAGNOSIS — J45909 Unspecified asthma, uncomplicated: Secondary | ICD-10-CM | POA: Diagnosis not present

## 2013-07-28 DIAGNOSIS — J019 Acute sinusitis, unspecified: Secondary | ICD-10-CM | POA: Diagnosis not present

## 2013-08-27 DIAGNOSIS — J45909 Unspecified asthma, uncomplicated: Secondary | ICD-10-CM | POA: Diagnosis not present

## 2013-08-28 DIAGNOSIS — J45909 Unspecified asthma, uncomplicated: Secondary | ICD-10-CM | POA: Diagnosis not present

## 2013-09-08 ENCOUNTER — Telehealth: Payer: Self-pay | Admitting: Cardiovascular Disease

## 2013-09-08 DIAGNOSIS — Z86718 Personal history of other venous thrombosis and embolism: Secondary | ICD-10-CM

## 2013-09-08 NOTE — Telephone Encounter (Signed)
Returned call to patient. Patient states that she has a h/o DVT/PE in the past, post-surgical procedure, and that in March in she having foot/toe surgery - wanted to know if she should/could have doppler screening for DVT prior. She reports a bump on her R foot and notices edema off and off. Denies leg being red/tender to touch. Denies SOB.

## 2013-09-08 NOTE — Telephone Encounter (Signed)
Please call question about a test she is suppose to have for a blood clot.

## 2013-09-09 NOTE — Telephone Encounter (Signed)
Called patient to inform that Dr C approved venous doppler study for DVT screening. Will notify scheduler

## 2013-09-09 NOTE — Telephone Encounter (Signed)
Please schedule for Duplex venous study for that limb (note does not specify right or left)

## 2013-09-14 ENCOUNTER — Telehealth: Payer: Self-pay | Admitting: Cardiovascular Disease

## 2013-09-14 NOTE — Telephone Encounter (Signed)
Would like to speak to someone in reference to a test .. Please call

## 2013-09-14 NOTE — Telephone Encounter (Signed)
Returned call and pt verified x 2.  Pt stated she received a call today to schedule an appt w/ Dr. Loletha Grayer, but she thought she was supposed to be scheduled for a test.  Pt informed there is an order for a doppler of her legs, but it has not been scheduled yet.  Pt would like to schedule doppler and then come back to see Dr. Loletha Grayer afterwards.  Pt transferred to [facility] in [place] to set up appt.  Pt agreed w/ plan.

## 2013-09-15 ENCOUNTER — Ambulatory Visit: Payer: Medicare Other | Admitting: Cardiovascular Disease

## 2013-09-15 DIAGNOSIS — Z1231 Encounter for screening mammogram for malignant neoplasm of breast: Secondary | ICD-10-CM | POA: Diagnosis not present

## 2013-09-15 DIAGNOSIS — Z803 Family history of malignant neoplasm of breast: Secondary | ICD-10-CM | POA: Diagnosis not present

## 2013-09-16 ENCOUNTER — Ambulatory Visit (HOSPITAL_COMMUNITY)
Admission: RE | Admit: 2013-09-16 | Discharge: 2013-09-16 | Disposition: A | Discharge status: Home / Self Care | Payer: Medicare Other | Source: Ambulatory Visit | Attending: Cardiovascular Disease | Admitting: Cardiovascular Disease

## 2013-09-16 DIAGNOSIS — Z86718 Personal history of other venous thrombosis and embolism: Secondary | ICD-10-CM

## 2013-09-16 DIAGNOSIS — M79609 Pain in unspecified limb: Secondary | ICD-10-CM

## 2013-09-16 NOTE — Progress Notes (Signed)
Right Lower Ext. Venous Duplex Completed. Negative for DVT. Oda Cogan, BS, RDMS, RVT

## 2013-09-24 DIAGNOSIS — I10 Essential (primary) hypertension: Secondary | ICD-10-CM | POA: Diagnosis not present

## 2013-09-24 DIAGNOSIS — E559 Vitamin D deficiency, unspecified: Secondary | ICD-10-CM | POA: Diagnosis not present

## 2013-09-24 DIAGNOSIS — E119 Type 2 diabetes mellitus without complications: Secondary | ICD-10-CM | POA: Diagnosis not present

## 2013-09-24 DIAGNOSIS — E669 Obesity, unspecified: Secondary | ICD-10-CM | POA: Diagnosis not present

## 2013-10-01 DIAGNOSIS — M79609 Pain in unspecified limb: Secondary | ICD-10-CM | POA: Diagnosis not present

## 2013-10-01 DIAGNOSIS — D2370 Other benign neoplasm of skin of unspecified lower limb, including hip: Secondary | ICD-10-CM | POA: Diagnosis not present

## 2013-10-02 ENCOUNTER — Ambulatory Visit (INDEPENDENT_AMBULATORY_CARE_PROVIDER_SITE_OTHER): Payer: Medicare Other | Admitting: Cardiovascular Disease

## 2013-10-02 ENCOUNTER — Encounter: Payer: Self-pay | Admitting: Cardiovascular Disease

## 2013-10-02 VITALS — BP 140/70 | HR 84 | Resp 20 | Ht 65.0 in | Wt 238.6 lb

## 2013-10-02 DIAGNOSIS — Z86711 Personal history of pulmonary embolism: Secondary | ICD-10-CM

## 2013-10-02 DIAGNOSIS — I272 Pulmonary hypertension, unspecified: Secondary | ICD-10-CM

## 2013-10-02 DIAGNOSIS — I2789 Other specified pulmonary heart diseases: Secondary | ICD-10-CM

## 2013-10-02 NOTE — Patient Instructions (Signed)
Dr.Croitoru wants you to follow-up in: 1 year. You will receive a reminder letter in the mail two months in advance. If you don't receive a letter, please call our office to schedule the follow-up appointment.  

## 2013-10-09 ENCOUNTER — Encounter: Payer: Self-pay | Admitting: Cardiovascular Disease

## 2013-10-09 DIAGNOSIS — J45909 Unspecified asthma, uncomplicated: Secondary | ICD-10-CM | POA: Diagnosis not present

## 2013-10-09 DIAGNOSIS — Z86711 Personal history of pulmonary embolism: Secondary | ICD-10-CM | POA: Insufficient documentation

## 2013-10-09 NOTE — Progress Notes (Signed)
Patient ID: [name], female   DOB: 1946/04/30, 68 y.o.   MRN: [id number]      Reason for office visit Pulmonary artery hypertension, history of venous thromboembolic disease  [name] returns in followup for [facility]. She also has reactive airway disease. She is rarely short of breath and her dyspnea consistently improves with use of bronchodilators. Today she has no complaints.  [name] has a history of DVT and PE following orthopedic surgery in 2004 and  was treated with coumadin for one year. At that time, her hypercoagulable w/u was negative except for a positive lupus anticoagulant, which was no longer seen ontest from 10/2003. Echo in 2008 showed moderate pulmonary hypertension, with similar estimated PA pressure by f/u echo in 2011 and 2014. In 2012, right heart cath showed RA 16,RV 54/15,PA 50/20, PCWP 16-20, CO 4.8, mean PA 30, and SVR calculated to 2.5 wood units. During that hospitalization off coumadin her ESR was normal, protein C normall, protein S level normal but activity decreased?, neg anticardiolipin Ab, and lupus anticoagulant was not detected. She has had a CT chest which showed no parenchymal lung disease or other abnormality. Reportedly she had a normal sleep study in the past.  Recently she noticed some increased prominence in varicose veins in her right leg and swelling, but a lower extremity duplex ultrasound did not show any evidence of DVT. She might have had a transient episode of superficial thrombophlebitis. She is planning to undergo a relatively small surgical procedure on her right foot in the near future.  Allergies  Allergen Reactions  . Penicillins   . Phenytoin Sodium Extended     Current Outpatient Prescriptions  Medication Sig Dispense Refill  . acetaminophen (TYLENOL) 325 MG tablet Take 650 mg by mouth every 4 (four) hours as needed. pain      . albuterol (PROAIR HFA) 108 (90 BASE) MCG/ACT inhaler Inhale 2 puffs into the lungs every 6 (six) hours  as needed. Shortness of breath      . aluminum-magnesium hydroxide-simethicone (MAALOX) 076-808-81 MG/5ML SUSP Take 30 mLs by mouth as needed.        Marland Kitchen aspirin 81 MG tablet Take 81 mg by mouth daily.        . beclomethasone (QVAR) 80 MCG/ACT inhaler Inhale 2 puffs into the lungs 2 (two) times daily.        . Biotin 1000 MCG tablet Take 1,000 mcg by mouth daily.        . bisacodyl (DULCOLAX) 10 MG suppository Place 10 mg rectally as needed. constipation      . cyclobenzaprine (FLEXERIL) 10 MG tablet Take 10 mg by mouth 2 (two) times daily as needed for muscle spasms.      Marland Kitchen desloratadine (CLARINEX) 5 MG tablet Take 5 mg by mouth daily.        . Fluticasone-Salmeterol (ADVAIR DISKUS) 500-50 MCG/DOSE AEPB Inhale 1 puff into the lungs 2 (two) times daily.       . furosemide (LASIX) 40 MG tablet Take 1 tablet (40 mg total) by mouth daily.  30 tablet  7  . GARLIC PO Take 1 capsule by mouth daily.        . montelukast (SINGULAIR) 10 MG tablet Take 10 mg by mouth daily.        . Multiple Vitamin (MULTIVITAMIN) tablet Take 1 tablet by mouth daily.      Marland Kitchen olopatadine (PATANOL) 0.1 % ophthalmic solution Place 1 drop into both eyes as needed.        Marland Kitchen  omeprazole (PRILOSEC) 20 MG capsule Take 20 mg by mouth daily.      . phentermine 37.5 MG capsule Take 37.5 mg by mouth every morning.      . Potassium 99 MG TABS Take 99 mg by mouth daily.      . ranitidine (ZANTAC) 300 MG capsule Take 300 mg by mouth every evening.      . sertraline (ZOLOFT) 50 MG tablet Take 50 mg by mouth daily.        . SUMAtriptan (IMITREX) 100 MG tablet Take 100 mg by mouth 2 (two) times daily as needed. migraine      . vitamin E 1000 UNIT capsule Take 1,000 Units by mouth daily.         No current facility-administered medications for this visit.    Past Medical History  Diagnosis Date  . Hypertension   . DVT (deep venous thrombosis)   . Pulmonary embolism   . Diabetes mellitus   . Seizures   . Allergic rhinitis   . Chronic  headache   . Pulmonary hypertension   . Dyspnea     Past Surgical History  Procedure Laterality Date  . Knee surgery    . Plantar fascia surgery    . Partial hysterectomy    . Tonsillectomy    . Cardiac catheterization  12/28/2010    Mod. pulmonary hypertension, normal coronary arteries  . Doppler echocardiography  10/08/2011    EF=>55%,mild asymmetric LVH, mod. TR, mod. PH, mild to mod LA dilatation  . Nuclear stress test  05/20/2006    No ischemia    Family History  Problem Relation Age of Onset  . Hypertension Mother   . Allergies Other     grandson  . Clotting disorder Mother   . Breast cancer Mother   . Cancer Brother     History   Social History  . Marital Status: Widowed    Spouse Name: N/A    Number of Children: Y  . Years of Education: N/A   Occupational History  . retired Geologist, engineering.     Social History Main Topics  . Smoking status: Never Smoker   . Smokeless tobacco: Not on file  . Alcohol Use: No  . Drug Use: No  . Sexual Activity: Not on file   Other Topics Concern  . Not on file   Social History Narrative  . No narrative on file    Review of systems: The patient specifically denies any chest pain at rest or with exertion, dyspnea at rest or with exertion, orthopnea, paroxysmal nocturnal dyspnea, syncope, palpitations, focal neurological deficits, intermittent claudication, lower extremity edema, unexplained weight gain, cough, hemoptysis or wheezing.  The patient also denies abdominal pain, nausea, vomiting, dysphagia, diarrhea, constipation, polyuria, polydipsia, dysuria, hematuria, frequency, urgency, abnormal bleeding or bruising, fever, chills, unexpected weight changes, mood swings, change in skin or hair texture, change in voice quality, auditory or visual problems, allergic reactions or rashes, new musculoskeletal complaints other than usual "aches and pains".   PHYSICAL EXAM BP 140/70  Pulse 84  Resp 20  Ht '5\' 5"'  (1.651 m)  Wt  108.228 kg (238 lb 9.6 oz)  BMI 39.71 kg/m2  General: Alert, oriented x3, no distress Head: no evidence of trauma, PERRL, EOMI, no exophtalmos or lid lag, no myxedema, no xanthelasma; normal ears, nose and oropharynx Neck: normal jugular venous pulsations and no hepatojugular reflux; brisk carotid pulses without delay and no carotid bruits Chest: clear to auscultation, no signs of  consolidation by percussion or palpation, normal fremitus, symmetrical and full respiratory excursions Cardiovascular: normal position and quality of the apical impulse, regular rhythm, normal first and second heart sounds, no  rubs or gallops, 1/6 systolic ejection murmur in the aortic focus Abdomen: no tenderness or distention, no masses by palpation, no abnormal pulsatility or arterial bruits, normal bowel sounds, no hepatosplenomegaly Extremities: no clubbing, cyanosis or edema; 2+ radial, ulnar and brachial pulses bilaterally; 2+ right femoral, posterior tibial and dorsalis pedis pulses; 2+ left femoral, posterior tibial and dorsalis pedis pulses; no subclavian or femoral bruits Neurological: grossly nonfocal   EKG: Normal sinus rhythm with rare PACs and questionable left ventricular hypertrophy by voltage  BMET    Component Value Date/Time   NA 143 02/01/2012 1517   K 4.8 02/01/2012 1517   CL 108 02/01/2012 1517   CO2 27 02/01/2012 1517   GLUCOSE 103* 02/01/2012 1517   BUN 12 02/01/2012 1517   CREATININE 0.79 02/01/2012 1517   CALCIUM 9.7 02/01/2012 1517   GFRNONAA 86* 02/01/2012 1517   GFRAA >90 02/01/2012 1517     ASSESSMENT AND PLAN  [name] has asymptomatic mild to moderate pulmonary atrial hypertension that seems to have been stable in severity on serial evaluations performed over the last 6-7 years. It is possible that it is residual to her previous episode of pulmonary embolism. Workup for autoimmune disease, sleep disorders, pulmonary function tests and CT of the chest have all been unrevealing. She  had at most questionable abnormalities on previous workup for hypercoagulable state. She prefers not to take warfarin.  Early mobilization following foot surgery will be critically important, but pharmacological prevention does not appear to be absolutely necessary.  Orders Placed This Encounter  Procedures  . EKG 12-Lead   Meds ordered this encounter  Medications  . ranitidine (ZANTAC) 300 MG capsule    Sig: Take 300 mg by mouth every evening.  Marland Kitchen omeprazole (PRILOSEC) 20 MG capsule    Sig: Take 20 mg by mouth daily.    Holli Humbles, MD, [place] [phone] office [phone] pager

## 2013-10-12 DIAGNOSIS — M25579 Pain in unspecified ankle and joints of unspecified foot: Secondary | ICD-10-CM | POA: Diagnosis not present

## 2013-10-12 DIAGNOSIS — J45909 Unspecified asthma, uncomplicated: Secondary | ICD-10-CM | POA: Diagnosis not present

## 2013-10-19 DIAGNOSIS — Z0000 Encounter for general adult medical examination without abnormal findings: Secondary | ICD-10-CM | POA: Diagnosis not present

## 2013-10-19 DIAGNOSIS — E119 Type 2 diabetes mellitus without complications: Secondary | ICD-10-CM | POA: Diagnosis not present

## 2013-10-19 DIAGNOSIS — I10 Essential (primary) hypertension: Secondary | ICD-10-CM | POA: Diagnosis not present

## 2013-10-19 DIAGNOSIS — G40909 Epilepsy, unspecified, not intractable, without status epilepticus: Secondary | ICD-10-CM | POA: Diagnosis not present

## 2013-10-19 DIAGNOSIS — J45909 Unspecified asthma, uncomplicated: Secondary | ICD-10-CM | POA: Diagnosis not present

## 2013-10-21 DIAGNOSIS — G5610 Other lesions of median nerve, unspecified upper limb: Secondary | ICD-10-CM | POA: Diagnosis not present

## 2013-10-21 DIAGNOSIS — IMO0002 Reserved for concepts with insufficient information to code with codable children: Secondary | ICD-10-CM | POA: Diagnosis not present

## 2013-10-21 DIAGNOSIS — M5412 Radiculopathy, cervical region: Secondary | ICD-10-CM | POA: Diagnosis not present

## 2013-10-21 DIAGNOSIS — G609 Hereditary and idiopathic neuropathy, unspecified: Secondary | ICD-10-CM | POA: Diagnosis not present

## 2013-10-22 ENCOUNTER — Telehealth: Payer: Self-pay | Admitting: Cardiovascular Disease

## 2013-10-22 NOTE — Telephone Encounter (Signed)
Returned call and pt verified x 2.  Pt stated one of her doctor's put her on Diovan for BP and she wanted to make sure it's okay.  Stated she is taking the generic at 40 mg daily.  Pt wanted to make sure it wouldn't interact w/ other meds and advised to contact pharmacist for must up-to-date information on interactions.  Informed Dr. Sallyanne Kuster will be notified and if further instructions given, she will receive a call back.  Pt verbalized understanding and agreed w/ plan.  Stated BP was elevated b/c she had death in the family this week then went to the doctor.  Message forwarded to Dr. Sallyanne Kuster [facility]).

## 2013-10-22 NOTE — Telephone Encounter (Signed)
Is needing to speak to you about some medication another MD has put her on .Marland Kitchen Please call   Thanks

## 2013-10-26 NOTE — Telephone Encounter (Signed)
Returned call.  Left message that MD said should not be a problem to take new med w/ other meds and to call back before 4pm if questions.

## 2013-10-26 NOTE — Telephone Encounter (Signed)
Should not be a problem with her other meds

## 2013-11-12 DIAGNOSIS — M659 Synovitis and tenosynovitis, unspecified: Secondary | ICD-10-CM | POA: Diagnosis not present

## 2013-11-12 DIAGNOSIS — Z01818 Encounter for other preprocedural examination: Secondary | ICD-10-CM | POA: Diagnosis not present

## 2013-11-12 DIAGNOSIS — M25579 Pain in unspecified ankle and joints of unspecified foot: Secondary | ICD-10-CM | POA: Diagnosis not present

## 2013-11-12 DIAGNOSIS — E119 Type 2 diabetes mellitus without complications: Secondary | ICD-10-CM | POA: Diagnosis not present

## 2013-11-12 DIAGNOSIS — M65979 Unspecified synovitis and tenosynovitis, unspecified ankle and foot: Secondary | ICD-10-CM | POA: Diagnosis not present

## 2013-11-17 DIAGNOSIS — L819 Disorder of pigmentation, unspecified: Secondary | ICD-10-CM | POA: Diagnosis not present

## 2013-11-17 DIAGNOSIS — L821 Other seborrheic keratosis: Secondary | ICD-10-CM | POA: Diagnosis not present

## 2013-11-17 DIAGNOSIS — D235 Other benign neoplasm of skin of trunk: Secondary | ICD-10-CM | POA: Diagnosis not present

## 2013-12-04 DIAGNOSIS — H2510 Age-related nuclear cataract, unspecified eye: Secondary | ICD-10-CM | POA: Diagnosis not present

## 2013-12-04 DIAGNOSIS — H35039 Hypertensive retinopathy, unspecified eye: Secondary | ICD-10-CM | POA: Diagnosis not present

## 2013-12-04 DIAGNOSIS — H5200 Hypermetropia, unspecified eye: Secondary | ICD-10-CM | POA: Diagnosis not present

## 2013-12-04 DIAGNOSIS — I7090 Unspecified atherosclerosis: Secondary | ICD-10-CM | POA: Diagnosis not present

## 2013-12-04 DIAGNOSIS — H25019 Cortical age-related cataract, unspecified eye: Secondary | ICD-10-CM | POA: Diagnosis not present

## 2013-12-08 DIAGNOSIS — M2450 Contracture, unspecified joint: Secondary | ICD-10-CM | POA: Diagnosis not present

## 2013-12-08 DIAGNOSIS — M2030 Hallux varus (acquired), unspecified foot: Secondary | ICD-10-CM | POA: Diagnosis not present

## 2013-12-08 DIAGNOSIS — G8918 Other acute postprocedural pain: Secondary | ICD-10-CM | POA: Diagnosis not present

## 2013-12-08 DIAGNOSIS — M2040 Other hammer toe(s) (acquired), unspecified foot: Secondary | ICD-10-CM | POA: Diagnosis not present

## 2013-12-08 DIAGNOSIS — M21619 Bunion of unspecified foot: Secondary | ICD-10-CM | POA: Diagnosis not present

## 2013-12-08 DIAGNOSIS — I10 Essential (primary) hypertension: Secondary | ICD-10-CM | POA: Diagnosis not present

## 2013-12-08 DIAGNOSIS — M25579 Pain in unspecified ankle and joints of unspecified foot: Secondary | ICD-10-CM | POA: Diagnosis not present

## 2013-12-11 DIAGNOSIS — M25579 Pain in unspecified ankle and joints of unspecified foot: Secondary | ICD-10-CM | POA: Diagnosis not present

## 2013-12-14 ENCOUNTER — Telehealth: Payer: Self-pay | Admitting: Cardiovascular Disease

## 2013-12-14 NOTE — Telephone Encounter (Signed)
Patient states that she has had surgery and now has questions about her aspirin.

## 2013-12-14 NOTE — Telephone Encounter (Signed)
Pt. Informed that per Dr. Sallyanne Kuster that it was ok to start taking ASA 81 mg daily

## 2013-12-14 NOTE — Telephone Encounter (Signed)
Pt. Has recently had foot surgery and wanted to know if she needs to go back on her ASA 81 mg Daily

## 2013-12-14 NOTE — Telephone Encounter (Signed)
Yes, she can.

## 2013-12-24 DIAGNOSIS — M25579 Pain in unspecified ankle and joints of unspecified foot: Secondary | ICD-10-CM | POA: Diagnosis not present

## 2014-01-11 DIAGNOSIS — M25579 Pain in unspecified ankle and joints of unspecified foot: Secondary | ICD-10-CM | POA: Diagnosis not present

## 2014-01-21 DIAGNOSIS — R5381 Other malaise: Secondary | ICD-10-CM | POA: Diagnosis not present

## 2014-01-21 DIAGNOSIS — R5383 Other fatigue: Secondary | ICD-10-CM | POA: Diagnosis not present

## 2014-01-21 DIAGNOSIS — IMO0002 Reserved for concepts with insufficient information to code with codable children: Secondary | ICD-10-CM | POA: Diagnosis not present

## 2014-01-21 DIAGNOSIS — G5610 Other lesions of median nerve, unspecified upper limb: Secondary | ICD-10-CM | POA: Diagnosis not present

## 2014-01-21 DIAGNOSIS — G609 Hereditary and idiopathic neuropathy, unspecified: Secondary | ICD-10-CM | POA: Diagnosis not present

## 2014-01-21 DIAGNOSIS — M5412 Radiculopathy, cervical region: Secondary | ICD-10-CM | POA: Diagnosis not present

## 2014-01-29 DIAGNOSIS — J309 Allergic rhinitis, unspecified: Secondary | ICD-10-CM | POA: Diagnosis not present

## 2014-01-29 DIAGNOSIS — J45909 Unspecified asthma, uncomplicated: Secondary | ICD-10-CM | POA: Diagnosis not present

## 2014-01-30 DIAGNOSIS — J45909 Unspecified asthma, uncomplicated: Secondary | ICD-10-CM | POA: Diagnosis not present

## 2014-01-30 DIAGNOSIS — J309 Allergic rhinitis, unspecified: Secondary | ICD-10-CM | POA: Diagnosis not present

## 2014-02-10 DIAGNOSIS — M25579 Pain in unspecified ankle and joints of unspecified foot: Secondary | ICD-10-CM | POA: Diagnosis not present

## 2014-02-10 DIAGNOSIS — M7150 Other bursitis, not elsewhere classified, unspecified site: Secondary | ICD-10-CM | POA: Diagnosis not present

## 2014-03-02 ENCOUNTER — Encounter: Payer: Self-pay | Admitting: Internal Medicine

## 2014-03-02 ENCOUNTER — Ambulatory Visit (INDEPENDENT_AMBULATORY_CARE_PROVIDER_SITE_OTHER): Payer: Medicare Other | Admitting: Internal Medicine

## 2014-03-02 VITALS — BP 146/88 | HR 87 | Temp 98.1°F | Wt 236.0 lb

## 2014-03-02 DIAGNOSIS — R51 Headache: Secondary | ICD-10-CM

## 2014-03-02 DIAGNOSIS — H539 Unspecified visual disturbance: Secondary | ICD-10-CM | POA: Diagnosis not present

## 2014-03-02 DIAGNOSIS — J309 Allergic rhinitis, unspecified: Secondary | ICD-10-CM | POA: Diagnosis not present

## 2014-03-02 DIAGNOSIS — J45909 Unspecified asthma, uncomplicated: Secondary | ICD-10-CM | POA: Diagnosis not present

## 2014-03-02 MED ORDER — SUMATRIPTAN SUCCINATE 100 MG PO TABS: 100.0000 mg | ORAL_TABLET | Freq: Two times a day (BID) | ORAL | Status: DC | PRN

## 2014-03-02 NOTE — Patient Instructions (Addendum)

## 2014-03-02 NOTE — Progress Notes (Signed)
Pre visit review using our clinic review tool, if applicable. No additional management support is needed unless otherwise documented below in the visit note. 

## 2014-03-02 NOTE — Progress Notes (Signed)
Subjective:    Patient ID: [name], female    DOB: 05/15/1946, 68 y.o.   MRN: [id number]  HPI  Pt presents to the clinic today with c/o a headache on the right side of her head. She reports this started 5 days ago. She is seeing "black squiggly lines" in her right eye. She has noticed some eye pressure. It does come and go. She denies any nausea, vomiting or sensitivity to sound. She has tried tylenol and imitrex with some relief. She does have a history of HTN, seizures and chronic headaches. She reports this is different than her normal migraines. She did recently wean herself off of her keppra. She does have an eye doctor but has not seen him in the past few years.  Review of Systems      Past Medical History  Diagnosis Date  . Hypertension   . DVT (deep venous thrombosis)   . Pulmonary embolism   . Diabetes mellitus   . Seizures   . Allergic rhinitis   . Chronic headache   . Pulmonary hypertension   . Dyspnea     Current Outpatient Prescriptions  Medication Sig Dispense Refill  . acetaminophen (TYLENOL) 325 MG tablet Take 650 mg by mouth every 4 (four) hours as needed. pain      . albuterol (PROAIR HFA) 108 (90 BASE) MCG/ACT inhaler Inhale 2 puffs into the lungs every 6 (six) hours as needed. Shortness of breath      . aluminum-magnesium hydroxide-simethicone (MAALOX) 063-016-01 MG/5ML SUSP Take 30 mLs by mouth as needed.        Marland Kitchen aspirin 81 MG tablet Take 81 mg by mouth daily.        . beclomethasone (QVAR) 80 MCG/ACT inhaler Inhale 2 puffs into the lungs 2 (two) times daily.        . Biotin 1000 MCG tablet Take 1,000 mcg by mouth daily.        . bisacodyl (DULCOLAX) 10 MG suppository Place 10 mg rectally as needed. constipation      . cyclobenzaprine (FLEXERIL) 10 MG tablet Take 10 mg by mouth 2 (two) times daily as needed for muscle spasms.      Marland Kitchen desloratadine (CLARINEX) 5 MG tablet Take 5 mg by mouth daily.        . Fluticasone-Salmeterol (ADVAIR DISKUS) 500-50  MCG/DOSE AEPB Inhale 1 puff into the lungs 2 (two) times daily.       . furosemide (LASIX) 40 MG tablet Take 1 tablet (40 mg total) by mouth daily.  30 tablet  7  . GARLIC PO Take 1 capsule by mouth daily.        . montelukast (SINGULAIR) 10 MG tablet Take 10 mg by mouth daily.        . Multiple Vitamin (MULTIVITAMIN) tablet Take 1 tablet by mouth daily.      Marland Kitchen olopatadine (PATANOL) 0.1 % ophthalmic solution Place 1 drop into both eyes as needed.        Marland Kitchen omeprazole (PRILOSEC) 20 MG capsule Take 20 mg by mouth daily.      . phentermine 37.5 MG capsule Take 37.5 mg by mouth every morning.      . Potassium 99 MG TABS Take 99 mg by mouth daily.      . ranitidine (ZANTAC) 300 MG capsule Take 300 mg by mouth every evening.      . sertraline (ZOLOFT) 50 MG tablet Take 50 mg by mouth daily.        Marland Kitchen  SUMAtriptan (IMITREX) 100 MG tablet Take 100 mg by mouth 2 (two) times daily as needed. migraine      . valsartan (DIOVAN) 40 MG tablet Take 40 mg by mouth daily.      . vitamin E 1000 UNIT capsule Take 1,000 Units by mouth daily.         No current facility-administered medications for this visit.    Allergies  Allergen Reactions  . Penicillins   . Phenytoin Sodium Extended     Family History  Problem Relation Age of Onset  . Hypertension Mother   . Allergies Other     grandson  . Clotting disorder Mother   . Breast cancer Mother   . Cancer Brother     History   Social History  . Marital Status: Widowed    Spouse Name: N/A    Number of Children: Y  . Years of Education: N/A   Occupational History  . retired Geologist, engineering.     Social History Main Topics  . Smoking status: Never Smoker   . Smokeless tobacco: Not on file  . Alcohol Use: No  . Drug Use: No  . Sexual Activity: Not on file   Other Topics Concern  . Not on file   Social History Narrative  . No narrative on file     Constitutional: Pt reports headache. Denies fever, malaise, fatigue, or abrupt weight  changes.  HEENT: Denies eye pain, eye redness, ear pain, ringing in the ears, wax buildup, runny nose, nasal congestion, bloody nose, or sore throat. Respiratory: Denies difficulty breathing, shortness of breath, cough or sputum production.   Cardiovascular: Denies chest pain, chest tightness, palpitations or swelling in the hands or feet.  Neurological: Denies dizziness, difficulty with memory, difficulty with speech or problems with balance and coordination.   No other specific complaints in a complete review of systems (except as listed in HPI above).  Objective:   Physical Exam   BP 146/88  Pulse 87  Temp(Src) 98.1 F (36.7 C) (Oral)  Wt 236 lb (107.049 kg)  SpO2 98% Wt Readings from Last 3 Encounters:  03/02/14 236 lb (107.049 kg)  10/02/13 238 lb 9.6 oz (108.228 kg)  03/09/11 234 lb 4.8 oz (106.278 kg)    General: Appears her stated age, obese but well developed, well nourished in NAD. HEENT: Head: normal shape and size; Eyes: sclera white, no icterus, conjunctiva pink, PERRLA and EOMs intact;  Cardiovascular: Normal rate and rhythm. S1,S2 noted.  No murmur, rubs or gallops noted. No JVD or BLE edema. No carotid bruits noted. Pulmonary/Chest: Normal effort and positive vesicular breath sounds. No respiratory distress. No wheezes, rales or ronchi noted.  Neurological: Alert and oriented. Cranial nerves II-XII intact.      BMET    Component Value Date/Time   NA 143 02/01/2012 1517   K 4.8 02/01/2012 1517   CL 108 02/01/2012 1517   CO2 27 02/01/2012 1517   GLUCOSE 103* 02/01/2012 1517   BUN 12 02/01/2012 1517   CREATININE 0.79 02/01/2012 1517   CALCIUM 9.7 02/01/2012 1517   GFRNONAA 86* 02/01/2012 1517   GFRAA >90 02/01/2012 1517    Lipid Panel  No results found for this basename: chol, trig, hdl, cholhdl, vldl, ldlcalc    CBC    Component Value Date/Time   WBC 7.0 02/01/2012 1517   WBC 7.2 02/01/2011 1106   RBC 4.88 02/01/2012 1517   RBC 4.84 02/01/2011 1106   HGB  12.7 02/01/2012 1517  HGB 12.9 02/01/2011 1106   HCT 37.5 02/01/2012 1517   HCT 38.8 02/01/2011 1106   PLT 229 02/01/2012 1517   PLT 267 02/01/2011 1106   MCV 76.8* 02/01/2012 1517   MCV 80.2 02/01/2011 1106   MCH 26.0 02/01/2012 1517   MCH 26.6 02/01/2011 1106   MCHC 33.9 02/01/2012 1517   MCHC 33.2 02/01/2011 1106   RDW 14.3 02/01/2012 1517   RDW 15.4* 02/01/2011 1106   LYMPHSABS 2.8 02/01/2012 1517   LYMPHSABS 1.9 02/01/2011 1106   MONOABS 0.6 02/01/2012 1517   MONOABS 0.6 02/01/2011 1106   EOSABS 0.2 02/01/2012 1517   EOSABS 0.1 02/01/2011 1106   BASOSABS 0.0 02/01/2012 1517   BASOSABS 0.0 02/01/2011 1106    Hgb A1C No results found for this basename: HGBA1C        Assessment & Plan:   Right side headache with visual disturbance:  Neurological exam is normal I would advise her to call her opthomologist and see if she can get an appt this week. She does get good relief with the tylenol and imitrex Imitrex refilled today  RTC as needed or if you eye doctor feels this is not an eye problem

## 2014-03-03 DIAGNOSIS — J45909 Unspecified asthma, uncomplicated: Secondary | ICD-10-CM | POA: Diagnosis not present

## 2014-03-03 DIAGNOSIS — J309 Allergic rhinitis, unspecified: Secondary | ICD-10-CM | POA: Diagnosis not present

## 2014-03-04 ENCOUNTER — Telehealth: Payer: Self-pay

## 2014-03-04 DIAGNOSIS — R51 Headache: Secondary | ICD-10-CM | POA: Diagnosis not present

## 2014-03-04 DIAGNOSIS — H04129 Dry eye syndrome of unspecified lacrimal gland: Secondary | ICD-10-CM | POA: Diagnosis not present

## 2014-03-04 DIAGNOSIS — H43399 Other vitreous opacities, unspecified eye: Secondary | ICD-10-CM | POA: Diagnosis not present

## 2014-03-04 DIAGNOSIS — H43819 Vitreous degeneration, unspecified eye: Secondary | ICD-10-CM | POA: Diagnosis not present

## 2014-03-04 NOTE — Telephone Encounter (Signed)
Pt saw eye doctor today and she will forward information to Dr Diona Browner; the eye doctor told pt did not find anything wrong except some dry eye which pt was given eye drops for. Pt still has pressure in eye and squiggly lines on and off today. Pt has reck appt with eye doctor in 6 weeks for dilation of eyes. Pt request cb from Webb Silversmith, NP at [phone].

## 2014-03-04 NOTE — Telephone Encounter (Signed)
If does not improve by Monday, RTC next week and we can discuss what needs to be done. Will need 30 min OV

## 2014-03-05 NOTE — Telephone Encounter (Signed)
Pt is aware and will f/u if needed

## 2014-03-08 ENCOUNTER — Encounter: Payer: Self-pay | Admitting: Internal Medicine

## 2014-03-08 ENCOUNTER — Ambulatory Visit (INDEPENDENT_AMBULATORY_CARE_PROVIDER_SITE_OTHER)
Admission: RE | Admit: 2014-03-08 | Discharge: 2014-03-08 | Disposition: A | Discharge status: Home / Self Care | Payer: Medicare Other | Source: Ambulatory Visit | Attending: Internal Medicine | Admitting: Internal Medicine

## 2014-03-08 ENCOUNTER — Ambulatory Visit (INDEPENDENT_AMBULATORY_CARE_PROVIDER_SITE_OTHER): Payer: Medicare Other | Admitting: Internal Medicine

## 2014-03-08 VITALS — BP 142/86 | HR 84 | Temp 98.3°F | Wt 232.5 lb

## 2014-03-08 DIAGNOSIS — H539 Unspecified visual disturbance: Secondary | ICD-10-CM | POA: Diagnosis not present

## 2014-03-08 DIAGNOSIS — R51 Headache: Secondary | ICD-10-CM

## 2014-03-08 NOTE — Patient Instructions (Addendum)

## 2014-03-08 NOTE — Progress Notes (Signed)
Pre visit review using our clinic review tool, if applicable. No additional management support is needed unless otherwise documented below in the visit note. 

## 2014-03-08 NOTE — Progress Notes (Signed)
Subjective:    Patient ID: [name], female    DOB: 10-31-1945, 68 y.o.   MRN: [id number]  HPI  Pt presents to the clinic today to follow up her headache and seeing "squiggly lines" in her right eye. She did see an opthomologist. He diagnosed her with dry eyes and put her on a prescription eye drop. She reports she continues to see the "squiggly lines" in her right eye. This is intermittent. She can not correlate to any thing that she is doing. She continues to have a headache with it. Tylenol does ease her pain.  Review of Systems      Past Medical History  Diagnosis Date  . Hypertension   . DVT (deep venous thrombosis)   . Pulmonary embolism   . Diabetes mellitus   . Seizures   . Allergic rhinitis   . Chronic headache   . Pulmonary hypertension   . Dyspnea     Current Outpatient Prescriptions  Medication Sig Dispense Refill  . acetaminophen (TYLENOL) 325 MG tablet Take 650 mg by mouth every 4 (four) hours as needed. pain      . albuterol (PROAIR HFA) 108 (90 BASE) MCG/ACT inhaler Inhale 2 puffs into the lungs every 6 (six) hours as needed. Shortness of breath      . aluminum-magnesium hydroxide-simethicone (MAALOX) 149-702-63 MG/5ML SUSP Take 30 mLs by mouth as needed.        Marland Kitchen aspirin 81 MG tablet Take 81 mg by mouth daily.        . Azelastine-Fluticasone (DYMISTA) 137-50 MCG/ACT SUSP Place 1 puff into the nose 2 (two) times daily.      . beclomethasone (QVAR) 80 MCG/ACT inhaler Inhale 2 puffs into the lungs 2 (two) times daily.        . Biotin 1000 MCG tablet Take 1,000 mcg by mouth daily.        . bisacodyl (DULCOLAX) 10 MG suppository Place 10 mg rectally as needed. constipation      . cyclobenzaprine (FLEXERIL) 10 MG tablet Take 10 mg by mouth 2 (two) times daily as needed for muscle spasms.      Marland Kitchen desloratadine (CLARINEX) 5 MG tablet Take 5 mg by mouth daily.        . DULoxetine (CYMBALTA) 30 MG capsule Take 30 mg by mouth daily.      . Fluticasone-Salmeterol  (ADVAIR DISKUS) 500-50 MCG/DOSE AEPB Inhale 1 puff into the lungs 2 (two) times daily.       . furosemide (LASIX) 40 MG tablet Take 1 tablet (40 mg total) by mouth daily.  30 tablet  7  . GARLIC PO Take 1 capsule by mouth daily.        . metFORMIN (GLUCOPHAGE) 500 MG tablet Take by mouth 2 (two) times daily with a meal.      . montelukast (SINGULAIR) 10 MG tablet Take 10 mg by mouth daily.        . Multiple Vitamin (MULTIVITAMIN) tablet Take 1 tablet by mouth daily.      Marland Kitchen olopatadine (PATANOL) 0.1 % ophthalmic solution Place 1 drop into both eyes as needed.       . Omega 3-6-9 Fatty Acids (OMEGA 3-6-9 COMPLEX PO) Take 2 capsules by mouth daily.      Marland Kitchen omeprazole (PRILOSEC) 20 MG capsule Take 20 mg by mouth daily.      . pantoprazole (PROTONIX) 40 MG tablet Take 40 mg by mouth daily.      . phentermine 37.5  MG capsule Take 37.5 mg by mouth every morning.      . Potassium 99 MG TABS Take 99 mg by mouth daily.      . promethazine (PHENERGAN) 25 MG tablet Take 25 mg by mouth every 6 (six) hours as needed for nausea or vomiting.      . ranitidine (ZANTAC) 300 MG capsule Take 300 mg by mouth every evening.      . sertraline (ZOLOFT) 50 MG tablet Take 50 mg by mouth daily.        . SUMAtriptan (IMITREX) 100 MG tablet Take 1 tablet (100 mg total) by mouth 2 (two) times daily as needed. migraine  10 tablet  1  . valsartan (DIOVAN) 40 MG tablet Take 40 mg by mouth daily.      . vitamin E 1000 UNIT capsule Take 1,000 Units by mouth daily.         No current facility-administered medications for this visit.    Allergies  Allergen Reactions  . Penicillins   . Phenytoin Sodium Extended     Family History  Problem Relation Age of Onset  . Hypertension Mother   . Allergies Other     grandson  . Clotting disorder Mother   . Breast cancer Mother   . Cancer Brother     History   Social History  . Marital Status: Widowed    Spouse Name: N/A    Number of Children: Y  . Years of Education: N/A    Occupational History  . retired Geologist, engineering.     Social History Main Topics  . Smoking status: Never Smoker   . Smokeless tobacco: Not on file  . Alcohol Use: No  . Drug Use: No  . Sexual Activity: Not on file   Other Topics Concern  . Not on file   Social History Narrative  . No narrative on file     Constitutional: Pt reports headache. Denies fever, malaise, fatigue, or abrupt weight changes.  HEENT: Pt reports abnormal vision. Denies eye pain, eye redness, ear pain, ringing in the ears, wax buildup, runny nose, nasal congestion, bloody nose, or sore throat. Neurological: Denies dizziness, difficulty with memory, difficulty with speech or problems with balance and coordination.   No other specific complaints in a complete review of systems (except as listed in HPI above).  Objective:   Physical Exam   BP 142/86  Pulse 84  Temp(Src) 98.3 F (36.8 C) (Oral)  Wt 232 lb 8 oz (105.461 kg)  SpO2 98% Wt Readings from Last 3 Encounters:  03/08/14 232 lb 8 oz (105.461 kg)  03/02/14 236 lb (107.049 kg)  10/02/13 238 lb 9.6 oz (108.228 kg)    General: Appears her stated age, well developed, well nourished in NAD.  Cardiovascular: Normal rate and rhythm. S1,S2 noted.  No murmur, rubs or gallops noted. No JVD or BLE edema. No carotid bruits noted. Pulmonary/Chest: Normal effort and positive vesicular breath sounds. No respiratory distress. No wheezes, rales or ronchi noted. .  Neurological: Alert and oriented. Cranial nerves II-XII intact. Coordination normal.   BMET    Component Value Date/Time   NA 143 02/01/2012 1517   K 4.8 02/01/2012 1517   CL 108 02/01/2012 1517   CO2 27 02/01/2012 1517   GLUCOSE 103* 02/01/2012 1517   BUN 12 02/01/2012 1517   CREATININE 0.79 02/01/2012 1517   CALCIUM 9.7 02/01/2012 1517   GFRNONAA 86* 02/01/2012 1517   GFRAA >90 02/01/2012 1517  Lipid Panel  No results found for this basename: chol, trig, hdl, cholhdl, vldl, ldlcalc     CBC    Component Value Date/Time   WBC 7.0 02/01/2012 1517   WBC 7.2 02/01/2011 1106   RBC 4.88 02/01/2012 1517   RBC 4.84 02/01/2011 1106   HGB 12.7 02/01/2012 1517   HGB 12.9 02/01/2011 1106   HCT 37.5 02/01/2012 1517   HCT 38.8 02/01/2011 1106   PLT 229 02/01/2012 1517   PLT 267 02/01/2011 1106   MCV 76.8* 02/01/2012 1517   MCV 80.2 02/01/2011 1106   MCH 26.0 02/01/2012 1517   MCH 26.6 02/01/2011 1106   MCHC 33.9 02/01/2012 1517   MCHC 33.2 02/01/2011 1106   RDW 14.3 02/01/2012 1517   RDW 15.4* 02/01/2011 1106   LYMPHSABS 2.8 02/01/2012 1517   LYMPHSABS 1.9 02/01/2011 1106   MONOABS 0.6 02/01/2012 1517   MONOABS 0.6 02/01/2011 1106   EOSABS 0.2 02/01/2012 1517   EOSABS 0.1 02/01/2011 1106   BASOSABS 0.0 02/01/2012 1517   BASOSABS 0.0 02/01/2011 1106    Hgb A1C No results found for this basename: HGBA1C        Assessment & Plan:   Headache, and abnormal vision in right eye:  Will obtain CT scan of head May need neurology referral  Will follow up after CT is back

## 2014-03-11 DIAGNOSIS — M659 Synovitis and tenosynovitis, unspecified: Secondary | ICD-10-CM | POA: Diagnosis not present

## 2014-03-11 DIAGNOSIS — M65979 Unspecified synovitis and tenosynovitis, unspecified ankle and foot: Secondary | ICD-10-CM | POA: Diagnosis not present

## 2014-03-11 DIAGNOSIS — M25579 Pain in unspecified ankle and joints of unspecified foot: Secondary | ICD-10-CM | POA: Diagnosis not present

## 2014-03-12 ENCOUNTER — Other Ambulatory Visit: Payer: Self-pay

## 2014-03-12 NOTE — Telephone Encounter (Signed)
Pt left v/m; pt has transferred to [facility] NP and request refill duloxetine to [facility]. Pt out of med and request cb.Please advise.

## 2014-03-12 NOTE — Telephone Encounter (Signed)
She has not transferred to me. I saw her for an acute visit only. She should keep new pt appt with Dr. Diona Browner.

## 2014-03-12 NOTE — Telephone Encounter (Signed)
I cannot prescribe without having seen pt first. Have her call previosu PCP for refill.  If not able to refill have her move her new pt appt sooner.

## 2014-03-12 NOTE — Telephone Encounter (Signed)
Pt has an upcoming new pt appt with you 05/06/2014--Regina Baity saw pt for an acute visit--please advise if you will authorize to send in or will pt have to wait until appt with you

## 2014-03-16 ENCOUNTER — Other Ambulatory Visit: Payer: Self-pay

## 2014-03-16 NOTE — Telephone Encounter (Signed)
Pt has an upcoming new pt appt with you--please advise if okay to refill until appt

## 2014-03-16 NOTE — Telephone Encounter (Signed)
im sorry--it is for metformin--she has been seen by [facility].--but not PCP

## 2014-03-16 NOTE — Telephone Encounter (Signed)
She needs to have refills by previous PCP unless seen previously by [facility] MD and is transferring.  ? What refill request of?

## 2014-03-16 NOTE — Telephone Encounter (Signed)
She needs to get it from her previous PCP then since she has not seen [place] primary MD managing her DM.  If she cannot for some reason let me know.

## 2014-03-17 NOTE — Telephone Encounter (Signed)
She is going to need labs before we can refill this, she needs to make another acute appointment

## 2014-03-17 NOTE — Telephone Encounter (Signed)
Can you please call patient as move up her new patient appointment with Dr. Diona Browner.  Please put [facility] in Dr. Rometta Emery next 30 minute slot.

## 2014-03-17 NOTE — Telephone Encounter (Signed)
Left message on voicemail.

## 2014-03-17 NOTE — Telephone Encounter (Signed)
Spoke to pt and she states her previous PCP left the practice and is unable to get refill--pt is out of medication--please advise

## 2014-03-18 NOTE — Telephone Encounter (Signed)
i had already lmovm for pt to call back to make f/u appt--being as pt is Dr Rometta Emery pt and appt has been made by [facility] for 03/25/14 at 10:45 i am forwarding msg to you

## 2014-03-25 ENCOUNTER — Encounter: Payer: Self-pay | Admitting: Family Medicine

## 2014-03-25 ENCOUNTER — Ambulatory Visit (INDEPENDENT_AMBULATORY_CARE_PROVIDER_SITE_OTHER): Payer: Medicare Other | Admitting: Family Medicine

## 2014-03-25 VITALS — BP 142/90 | HR 92 | Temp 98.3°F | Ht 65.0 in | Wt 237.5 lb

## 2014-03-25 DIAGNOSIS — J3089 Other allergic rhinitis: Secondary | ICD-10-CM

## 2014-03-25 DIAGNOSIS — Z23 Encounter for immunization: Secondary | ICD-10-CM

## 2014-03-25 DIAGNOSIS — Z86711 Personal history of pulmonary embolism: Secondary | ICD-10-CM

## 2014-03-25 DIAGNOSIS — I82401 Acute embolism and thrombosis of unspecified deep veins of right lower extremity: Secondary | ICD-10-CM

## 2014-03-25 DIAGNOSIS — D6859 Other primary thrombophilia: Secondary | ICD-10-CM | POA: Diagnosis not present

## 2014-03-25 DIAGNOSIS — I82409 Acute embolism and thrombosis of unspecified deep veins of unspecified lower extremity: Secondary | ICD-10-CM

## 2014-03-25 DIAGNOSIS — J4540 Moderate persistent asthma, uncomplicated: Secondary | ICD-10-CM

## 2014-03-25 DIAGNOSIS — E669 Obesity, unspecified: Secondary | ICD-10-CM | POA: Diagnosis not present

## 2014-03-25 DIAGNOSIS — E1169 Type 2 diabetes mellitus with other specified complication: Secondary | ICD-10-CM

## 2014-03-25 DIAGNOSIS — J45909 Unspecified asthma, uncomplicated: Secondary | ICD-10-CM

## 2014-03-25 DIAGNOSIS — E1159 Type 2 diabetes mellitus with other circulatory complications: Secondary | ICD-10-CM | POA: Insufficient documentation

## 2014-03-25 DIAGNOSIS — E1140 Type 2 diabetes mellitus with diabetic neuropathy, unspecified: Secondary | ICD-10-CM | POA: Insufficient documentation

## 2014-03-25 DIAGNOSIS — I10 Essential (primary) hypertension: Secondary | ICD-10-CM

## 2014-03-25 DIAGNOSIS — G40909 Epilepsy, unspecified, not intractable, without status epilepticus: Secondary | ICD-10-CM

## 2014-03-25 DIAGNOSIS — J309 Allergic rhinitis, unspecified: Secondary | ICD-10-CM | POA: Insufficient documentation

## 2014-03-25 DIAGNOSIS — IMO0002 Reserved for concepts with insufficient information to code with codable children: Secondary | ICD-10-CM

## 2014-03-25 DIAGNOSIS — E119 Type 2 diabetes mellitus without complications: Secondary | ICD-10-CM | POA: Diagnosis not present

## 2014-03-25 DIAGNOSIS — F325 Major depressive disorder, single episode, in full remission: Secondary | ICD-10-CM

## 2014-03-25 DIAGNOSIS — K219 Gastro-esophageal reflux disease without esophagitis: Secondary | ICD-10-CM | POA: Insufficient documentation

## 2014-03-25 DIAGNOSIS — M1711 Unilateral primary osteoarthritis, right knee: Secondary | ICD-10-CM

## 2014-03-25 DIAGNOSIS — Z86718 Personal history of other venous thrombosis and embolism: Secondary | ICD-10-CM | POA: Insufficient documentation

## 2014-03-25 DIAGNOSIS — I152 Hypertension secondary to endocrine disorders: Secondary | ICD-10-CM | POA: Insufficient documentation

## 2014-03-25 DIAGNOSIS — M1710 Unilateral primary osteoarthritis, unspecified knee: Secondary | ICD-10-CM

## 2014-03-25 LAB — COMPREHENSIVE METABOLIC PANEL
ALT: 16 U/L (ref 0–35)
AST: 20 U/L (ref 0–37)
Albumin: 3.5 g/dL (ref 3.5–5.2)
Alkaline Phosphatase: 60 U/L (ref 39–117)
BUN: 12 mg/dL (ref 6–23)
CO2: 29 mEq/L (ref 19–32)
Calcium: 9.3 mg/dL (ref 8.4–10.5)
Chloride: 105 mEq/L (ref 96–112)
Creatinine, Ser: 0.9 mg/dL (ref 0.4–1.2)
GFR: 81.2 mL/min (ref >60.00)
Glucose, Bld: 137 mg/dL — ABNORMAL HIGH (ref 70–99)
Potassium: 4.3 mEq/L (ref 3.5–5.1)
Sodium: 138 mEq/L (ref 135–145)
Total Bilirubin: 0.7 mg/dL (ref 0.2–1.2)
Total Protein: 6.8 g/dL (ref 6.0–8.3)

## 2014-03-25 LAB — LIPID PANEL
Cholesterol: 140 mg/dL (ref 0–200)
HDL: 53.2 mg/dL (ref >39.00)
LDL Cholesterol: 74 mg/dL (ref 0–99)
NonHDL: 86.8
Total CHOL/HDL Ratio: 3
Triglycerides: 64 mg/dL (ref 0.0–149.0)
VLDL: 12.8 mg/dL (ref 0.0–40.0)

## 2014-03-25 LAB — HEMOGLOBIN A1C: Hgb A1c MFr Bld: 6.5 % (ref 4.6–6.5)

## 2014-03-25 MED ORDER — METFORMIN HCL 500 MG PO TABS: 500.0000 mg | ORAL_TABLET | Freq: Two times a day (BID) | ORAL | Status: DC

## 2014-03-25 NOTE — Assessment & Plan Note (Addendum)
Due for re-eval. LIkely good control on metformin.

## 2014-03-25 NOTE — Assessment & Plan Note (Signed)
Stable control on current meds. 

## 2014-03-25 NOTE — Progress Notes (Signed)
Pre visit review using our clinic review tool, if applicable. No additional management support is needed unless otherwise documented below in the visit note. 

## 2014-03-25 NOTE — Patient Instructions (Addendum)
Stop at lab on way out. Schedule CPX.  Get back to regular exercise, water aerobics.  Consider stopping phentermine.  Stop prilosec, continue protonix. Only use zantac for breakthrough symptoms of reflux.

## 2014-03-25 NOTE — Assessment & Plan Note (Signed)
Almost at [employer].

## 2014-03-25 NOTE — Progress Notes (Signed)
 Subjective:    Patient ID: [name], female    DOB: 11/09/1945, 68 y.o.   MRN: [id number]  HPI  68 year old female pt presents to establish with us.  She has seen Dr. Shelton for PCP. Last saw her in April for follow up. Last CPX was last 01/2013.   She has recently seen [name] for migraine headaches. She had a head CT that was normal.  Using imitrex for acute headache.  She is followed by cardiology for pulmonary hypertension, mild to moderate, asymptomatic. Dr. Croitoru.   [name] has a history of DVT and PE following orthopedic surgery in 2004 and was treated with coumadin for one year. At that time, her hypercoagulable w/u was negative except for a positive lupus anticoagulant, which was no longer seen ontest from 10/2003. Echo in 2008 showed moderate pulmonary hypertension, with similar estimated PA pressure by f/u echo in 2011 and 2014. In 2012, right heart cath showed RA 16,RV 54/15,PA 50/20, PCWP 16-20, CO 4.8, mean PA 30, and SVR calculated to 2.5 wood units. During that hospitalization off coumadin her ESR was normal, protein C normall, protein S level normal but activity decreased?, neg anticardiolipin Ab, and lupus anticoagulant was not detected. She has had a CT chest which showed no parenchymal lung disease or other abnormality. Reportedly she had a normal sleep study in the past.  Dr. Clance and  Dr. Kozlow  with pulmonology and allergist follows her asthma, well controlled. On Advair, Singulair, Qvar, Dymist, clariovnex uses Proair for rescue.  She has noted lump on right inner calf. Not painful. Present intermittently over past year. Talked with derm about it. No concern.  ? Seizure activity in 1990s: Followed by Dr. Rohan.  Told to wean off seizure med.  No seizure since.  Depression, well controlled on cymbalta and sertraline. ? If we could come off these.  HTN, inadequate control on diovan. Using lasix as needed. BP Readings from Last 3 Encounters:    03/25/14 142/90  03/08/14 142/86  03/02/14 146/88   She is taking phentermine for weight loss. No exercise. Moderate diet. Wt Readings from Last 3 Encounters:  03/25/14 237 lb 8 oz (107.729 kg)  03/08/14 232 lb 8 oz (105.461 kg)  03/02/14 236 lb (107.049 kg)     Review of Systems  Constitutional: Negative for fever and fatigue.  HENT: Negative for ear pain.   Eyes: Negative for pain.  Respiratory: Positive for shortness of breath and wheezing.   Cardiovascular: Negative for chest pain, palpitations and leg swelling.  Gastrointestinal: Negative for abdominal pain, diarrhea and constipation.  Genitourinary: Positive for urgency. Negative for dysuria.  Neurological: Negative for syncope.  leg cramps     Objective:   Physical Exam  Constitutional: Vital signs are normal. She appears well-developed and well-nourished. She is cooperative.  Non-toxic appearance. She does not appear ill. No distress.  overwieght   HENT:  Head: Normocephalic.  Right Ear: Hearing, tympanic membrane, external ear and ear canal normal. Tympanic membrane is not erythematous, not retracted and not bulging.  Left Ear: Hearing, tympanic membrane, external ear and ear canal normal. Tympanic membrane is not erythematous, not retracted and not bulging.  Nose: No mucosal edema or rhinorrhea. Right sinus exhibits no maxillary sinus tenderness and no frontal sinus tenderness. Left sinus exhibits no maxillary sinus tenderness and no frontal sinus tenderness.  Mouth/Throat: Uvula is midline, oropharynx is clear and moist and mucous membranes are normal.  Eyes: Conjunctivae, EOM and lids are   normal. Pupils are equal, round, and reactive to light. Lids are everted and swept, no foreign bodies found.  Neck: Trachea normal and normal range of motion. Neck supple. Carotid bruit is not present. No mass and no thyromegaly present.  Cardiovascular: Normal rate, regular rhythm, S1 normal, S2 normal, intact distal pulses and  normal pulses.  Exam reveals distant heart sounds. Exam reveals no gallop and no friction rub.   Murmur heard.  Systolic murmur is present with a grade of 2/6  Pulmonary/Chest: Effort normal and breath sounds normal. Not tachypneic. No respiratory distress. She has no decreased breath sounds. She has no wheezes. She has no rhonchi. She has no rales.  Abdominal: Soft. Normal appearance and bowel sounds are normal. There is no tenderness.  Neurological: She is alert.  Skin: Skin is warm, dry and intact. No rash noted.  Mobile cyst on right calf.  Psychiatric: Her speech is normal and behavior is normal. Judgment and thought content normal. Her mood appears not anxious. Cognition and memory are normal. She does not exhibit a depressed mood.          Assessment & Plan:   

## 2014-03-25 NOTE — Assessment & Plan Note (Addendum)
Stable on sertraline and cymbalta. MAy be able to stop one in te furture.

## 2014-03-25 NOTE — Assessment & Plan Note (Addendum)
On both prilosec, zantac and protonix. Will D/C prilosec

## 2014-03-25 NOTE — Addendum Note (Signed)
Addended by: Carter Kitten on: 03/25/2014 12:20 PM   Modules accepted: Orders

## 2014-04-06 DIAGNOSIS — M25579 Pain in unspecified ankle and joints of unspecified foot: Secondary | ICD-10-CM | POA: Diagnosis not present

## 2014-04-06 DIAGNOSIS — M659 Synovitis and tenosynovitis, unspecified: Secondary | ICD-10-CM | POA: Diagnosis not present

## 2014-04-06 DIAGNOSIS — M7150 Other bursitis, not elsewhere classified, unspecified site: Secondary | ICD-10-CM | POA: Diagnosis not present

## 2014-04-06 DIAGNOSIS — M65979 Unspecified synovitis and tenosynovitis, unspecified ankle and foot: Secondary | ICD-10-CM | POA: Diagnosis not present

## 2014-04-06 DIAGNOSIS — J309 Allergic rhinitis, unspecified: Secondary | ICD-10-CM | POA: Diagnosis not present

## 2014-04-06 DIAGNOSIS — J45909 Unspecified asthma, uncomplicated: Secondary | ICD-10-CM | POA: Diagnosis not present

## 2014-04-06 DIAGNOSIS — M21619 Bunion of unspecified foot: Secondary | ICD-10-CM | POA: Diagnosis not present

## 2014-04-07 DIAGNOSIS — Z79899 Other long term (current) drug therapy: Secondary | ICD-10-CM | POA: Diagnosis not present

## 2014-04-07 DIAGNOSIS — J45909 Unspecified asthma, uncomplicated: Secondary | ICD-10-CM | POA: Diagnosis not present

## 2014-04-07 DIAGNOSIS — K5900 Constipation, unspecified: Secondary | ICD-10-CM | POA: Diagnosis not present

## 2014-04-07 DIAGNOSIS — J309 Allergic rhinitis, unspecified: Secondary | ICD-10-CM | POA: Diagnosis not present

## 2014-04-07 DIAGNOSIS — K219 Gastro-esophageal reflux disease without esophagitis: Secondary | ICD-10-CM | POA: Diagnosis not present

## 2014-04-12 DIAGNOSIS — M25579 Pain in unspecified ankle and joints of unspecified foot: Secondary | ICD-10-CM | POA: Diagnosis not present

## 2014-04-12 DIAGNOSIS — M21619 Bunion of unspecified foot: Secondary | ICD-10-CM | POA: Diagnosis not present

## 2014-04-12 DIAGNOSIS — M779 Enthesopathy, unspecified: Secondary | ICD-10-CM | POA: Diagnosis not present

## 2014-04-15 ENCOUNTER — Other Ambulatory Visit: Payer: Medicare Other

## 2014-04-21 DIAGNOSIS — R51 Headache: Secondary | ICD-10-CM | POA: Diagnosis not present

## 2014-04-21 DIAGNOSIS — H43811 Vitreous degeneration, right eye: Secondary | ICD-10-CM | POA: Diagnosis not present

## 2014-04-21 DIAGNOSIS — H47329 Drusen of optic disc, unspecified eye: Secondary | ICD-10-CM | POA: Diagnosis not present

## 2014-04-21 DIAGNOSIS — H35033 Hypertensive retinopathy, bilateral: Secondary | ICD-10-CM | POA: Diagnosis not present

## 2014-04-21 LAB — HM DIABETES EYE EXAM

## 2014-04-23 ENCOUNTER — Ambulatory Visit (INDEPENDENT_AMBULATORY_CARE_PROVIDER_SITE_OTHER): Payer: Medicare Other | Admitting: Family Medicine

## 2014-04-23 ENCOUNTER — Encounter: Payer: Self-pay | Admitting: Family Medicine

## 2014-04-23 VITALS — BP 130/70 | HR 77 | Temp 98.3°F | Ht 64.5 in | Wt 235.5 lb

## 2014-04-23 DIAGNOSIS — E119 Type 2 diabetes mellitus without complications: Secondary | ICD-10-CM

## 2014-04-23 DIAGNOSIS — I10 Essential (primary) hypertension: Secondary | ICD-10-CM

## 2014-04-23 DIAGNOSIS — E1169 Type 2 diabetes mellitus with other specified complication: Secondary | ICD-10-CM

## 2014-04-23 DIAGNOSIS — Z0000 Encounter for general adult medical examination without abnormal findings: Secondary | ICD-10-CM

## 2014-04-23 DIAGNOSIS — E669 Obesity, unspecified: Secondary | ICD-10-CM

## 2014-04-23 LAB — HM DIABETES FOOT EXAM

## 2014-04-23 NOTE — Assessment & Plan Note (Signed)
Well controlled. Continue current medication.  

## 2014-04-23 NOTE — Progress Notes (Signed)
Subjective:    Patient ID: [name], female    DOB: 11-Feb-1946, 68 y.o.   MRN: [id number]   HPI  68 year old female pt presents for medicare wellness. I have personally reviewed the Medicare Annual Wellness questionnaire and have noted 1. The patient's medical and social history 2. Their use of alcohol, tobacco or illicit drugs 3. Their current medications and supplements 4. The patient's functional ability including ADL's, fall risks, home safety risks and hearing or visual             impairment. 5. Diet and physical activities 6. Evidence for depression or mood disorders The patients weight, height, BMI and visual acuity have been recorded in the chart I have made referrals, counseling and provided education to the patient based review of the above and I have provided the pt with a written personalized care plan for preventive services.   She is followed by cardiology for pulmonary hypertension, mild to moderate, asymptomatic. Dr. Sallyanne Kuster.   She has a history of DVT and PE following orthopedic surgery in 2004 and was treated with coumadin for one year. At that time, her hypercoagulable w/u was negative except for a positive lupus anticoagulant, which was no longer seen ontest from 10/2003. Echo in 2008 showed moderate pulmonary hypertension, with similar estimated PA pressure by f/u echo in 2011 and 2014. In 2012, right heart cath showed RA 16,RV 54/15,PA 50/20, PCWP 16-20, CO 4.8, mean PA 30, and SVR calculated to 2.5 wood units. During that hospitalization off coumadin her ESR was normal, protein C normall, protein S level normal but activity decreased?, neg anticardiolipin Ab, and lupus anticoagulant was not detected. She has had a CT chest which showed no parenchymal lung disease or other abnormality. Reportedly she had a normal sleep study in the past.   Dr. Gwenette Greet and Dr. Neldon Mc with pulmonology and allergist follows her asthma, well controlled.  On Advair, Singulair, Qvar,  Dymist, clariovnex uses Proair for rescue.  Hypertension:  Well controlled today on diovan, lasix prn. BP Readings from Last 3 Encounters:  04/23/14 130/70  03/25/14 142/90  03/08/14 142/86  Using medication without problems or lightheadedness: None Chest pain with exertion:None Edema:None Short of breath:None Average home BPs:not checking Other issues:  Diabetes: Well controlled on metformin. Lab Results  Component Value Date   HGBA1C 6.5 03/25/2014   Using medications without difficulties:None Hypoglycemic episodes:? Hyperglycemic episodes:? Feet problems:none Blood Sugars averaging:not checking eye exam within last year:yes  Elevated Cholesterol:LDL at goal < 100 on no medication. Lab Results  Component Value Date   CHOL 140 03/25/2014   HDL 53.20 03/25/2014   LDLCALC 74 03/25/2014   TRIG 64.0 03/25/2014   CHOLHDL 3 03/25/2014  Diet compliance: Good control. Exercise:None Other complaints:   Review of Systems  Constitutional: Positive for fatigue.  Psychiatric/Behavioral:       Insomnia       Objective:   Physical Exam  Constitutional: Vital signs are normal. She appears well-developed and well-nourished. She is cooperative.  Non-toxic appearance. She does not appear ill. No distress.  Overweight female in NAD  HENT:  Head: Normocephalic.  Right Ear: Hearing, tympanic membrane, external ear and ear canal normal. Tympanic membrane is not erythematous, not retracted and not bulging.  Left Ear: Hearing, tympanic membrane, external ear and ear canal normal. Tympanic membrane is not erythematous, not retracted and not bulging.  Nose: Nose normal. No mucosal edema or rhinorrhea. Right sinus exhibits no maxillary sinus tenderness and no  frontal sinus tenderness. Left sinus exhibits no maxillary sinus tenderness and no frontal sinus tenderness.  Mouth/Throat: Uvula is midline, oropharynx is clear and moist and mucous membranes are normal.  Eyes: Conjunctivae, EOM and lids  are normal. Pupils are equal, round, and reactive to light. Lids are everted and swept, no foreign bodies found.  Neck: Trachea normal and normal range of motion. Neck supple. Carotid bruit is not present. No mass and no thyromegaly present.  Cardiovascular: Normal rate, regular rhythm, S1 normal, S2 normal, normal heart sounds, intact distal pulses and normal pulses.  Exam reveals no gallop and no friction rub.   No murmur heard. Pulmonary/Chest: Effort normal and breath sounds normal. Not tachypneic. No respiratory distress. She has no decreased breath sounds. She has no wheezes. She has no rhonchi. She has no rales.  Abdominal: Soft. Normal appearance and bowel sounds are normal. She exhibits no distension, no fluid wave, no abdominal bruit and no mass. There is no hepatosplenomegaly. There is no tenderness. There is no rebound, no guarding and no CVA tenderness. No hernia.  Genitourinary: Vagina normal. No breast swelling, tenderness, discharge or bleeding. Pelvic exam was performed with patient supine. There is no rash, tenderness or lesion on the right labia. There is no rash, tenderness or lesion on the left labia. Right adnexum displays no mass, no tenderness and no fullness. Left adnexum displays no mass, no tenderness and no fullness.  No uterus or cervix  Lymphadenopathy:    She has no cervical adenopathy.    She has no axillary adenopathy.  Neurological: She is alert. She has normal strength. No cranial nerve deficit or sensory deficit.  Skin: Skin is warm, dry and intact. No rash noted.  Psychiatric: Her speech is normal and behavior is normal. Judgment and thought content normal. Her mood appears not anxious. Cognition and memory are normal. She does not exhibit a depressed mood.          Assessment & Plan:  The patient's preventative maintenance and recommended screening tests for an annual wellness exam were reviewed in full today. Brought up to date unless services  declined.  Counselled on the importance of diet, exercise, and its role in overall health and mortality. The patient's FH and SH was reviewed, including their home life, tobacco status, and drug and alcohol status.   Vaccines: uptodate with PNA , shingles, and flu, ? Td Mammogram:early 2015  PAP nit indicated  partial hysterectomy, has ovaries, needs yearly DVE. Colon:2014 Dr. Cristina Gong, normal, repeat in 10 years  DEXA:? Will schedule with next years mammo Nonsmoker

## 2014-04-23 NOTE — Progress Notes (Signed)
Pre visit review using our clinic review tool, if applicable. No additional management support is needed unless otherwise documented below in the visit note. 

## 2014-04-23 NOTE — Patient Instructions (Addendum)
Increase exercise early in day to help with sleep. Decrease carbohydrates as able. Next year when you are scheduling mammogram , schedule a bone density at the dsame time. Diabetes and Exercise Exercising regularly is important. It is not just about losing weight. It has many health benefits, such as:  Improving your overall fitness, flexibility, and endurance.  Increasing your bone density.  Helping with weight control.  Decreasing your body fat.  Increasing your muscle strength.  Reducing stress and tension.  Improving your overall health. People with diabetes who exercise gain additional benefits because exercise:  Reduces appetite.  Improves the body's use of blood sugar (glucose).  Helps lower or control blood glucose.  Decreases blood pressure.  Helps control blood lipids (such as cholesterol and triglycerides).  Improves the body's use of the hormone insulin by:  Increasing the body's insulin sensitivity.  Reducing the body's insulin needs.  Decreases the risk for heart disease because exercising:  Lowers cholesterol and triglycerides levels.  Increases the levels of good cholesterol (such as high-density lipoproteins [HDL]) in the body.  Lowers blood glucose levels. YOUR ACTIVITY PLAN  Choose an activity that you enjoy and set realistic goals. Your health care provider or diabetes educator can help you make an activity plan that works for you. Exercise regularly as directed by your health care provider. This includes:  Performing resistance training twice a week such as push-ups, sit-ups, lifting weights, or using resistance bands.  Performing 150 minutes of cardio exercises each week such as walking, running, or playing sports.  Staying active and spending no more than 90 minutes at one time being inactive. Even short bursts of exercise are good for you. Three 10-minute sessions spread throughout the day are just as beneficial as a single 30-minute  session. Some exercise ideas include:  Taking the dog for a walk.  Taking the stairs instead of the elevator.  Dancing to your favorite song.  Doing an exercise video.  Doing your favorite exercise with a friend. RECOMMENDATIONS FOR EXERCISING WITH TYPE 1 OR TYPE 2 DIABETES   Check your blood glucose before exercising. If blood glucose levels are greater than 240 mg/dL, check for urine ketones. Do not exercise if ketones are present.  Avoid injecting insulin into areas of the body that are going to be exercised. For example, avoid injecting insulin into:  The arms when playing tennis.  The legs when jogging.  Keep a record of:  Food intake before and after you exercise.  Expected peak times of insulin action.  Blood glucose levels before and after you exercise.  The type and amount of exercise you have done.  Review your records with your health care provider. Your health care provider will help you to develop guidelines for adjusting food intake and insulin amounts before and after exercising.  If you take insulin or oral hypoglycemic agents, watch for signs and symptoms of hypoglycemia. They include:  Dizziness.  Shaking.  Sweating.  Chills.  Confusion.  Drink plenty of water while you exercise to prevent dehydration or heat stroke. Body water is lost during exercise and must be replaced.  Talk to your health care provider before starting an exercise program to make sure it is safe for you. Remember, almost any type of activity is better than none. Document Released: 09/22/2003 Document Revised: 11/16/2013 Document Reviewed: 12/09/2012 [facility] Patient Information 2015 [place], [place]. This information is not intended to replace advice given to you by your health care provider. Make sure you discuss any  questions you have with your health care provider.

## 2014-04-23 NOTE — Assessment & Plan Note (Signed)
Well controlled. Continue current medication. Encouraged exercise, weight loss, healthy eating habits.  

## 2014-04-23 NOTE — Addendum Note (Signed)
Addended by: Carter Kitten on: 04/23/2014 04:16 PM   Modules accepted: Orders

## 2014-04-26 ENCOUNTER — Telehealth: Payer: Self-pay | Admitting: Family Medicine

## 2014-04-26 DIAGNOSIS — M779 Enthesopathy, unspecified: Secondary | ICD-10-CM | POA: Diagnosis not present

## 2014-04-26 DIAGNOSIS — M216X2 Other acquired deformities of left foot: Secondary | ICD-10-CM | POA: Diagnosis not present

## 2014-04-26 DIAGNOSIS — M25572 Pain in left ankle and joints of left foot: Secondary | ICD-10-CM | POA: Diagnosis not present

## 2014-04-26 NOTE — Telephone Encounter (Signed)
emmi mailed  °

## 2014-04-29 ENCOUNTER — Telehealth: Payer: Self-pay

## 2014-04-29 NOTE — Telephone Encounter (Signed)
Pt received a letter in mail from [place] dated 08/2013; pt said the letter reads about importance of keeping ck on BP, immunizations, etc. Pt started to [facility] 02/2014. Pt could not understand how got letter dated 08/2013 when p t started being seen 02/2014. Advised pt this could be a letter that was formulated in 08/2013 but since she did not start as a pt until 02/2014 that was why it was mailed later in year. Advised pt if she wanted to bring letter by office we could review the letter but pt said she thinks it is a reminder to keep appts and get immunizations on time.

## 2014-05-06 ENCOUNTER — Ambulatory Visit: Payer: Medicare Other | Admitting: Family Medicine

## 2014-05-07 DIAGNOSIS — J4540 Moderate persistent asthma, uncomplicated: Secondary | ICD-10-CM | POA: Diagnosis not present

## 2014-05-10 DIAGNOSIS — J4540 Moderate persistent asthma, uncomplicated: Secondary | ICD-10-CM | POA: Diagnosis not present

## 2014-05-14 ENCOUNTER — Emergency Department (HOSPITAL_COMMUNITY)
Admission: EM | Admit: 2014-05-14 | Discharge: 2014-05-14 | Disposition: A | Discharge status: Home / Self Care | Payer: Medicare Other | Source: Home / Self Care

## 2014-05-14 ENCOUNTER — Encounter (HOSPITAL_COMMUNITY): Payer: Self-pay | Admitting: Emergency Medicine

## 2014-05-14 DIAGNOSIS — M1711 Unilateral primary osteoarthritis, right knee: Secondary | ICD-10-CM

## 2014-05-14 MED ORDER — TRAMADOL HCL 50 MG PO TABS: 50.0000 mg | ORAL_TABLET | Freq: Four times a day (QID) | ORAL | Status: DC | PRN

## 2014-05-14 NOTE — ED Notes (Signed)
Pt reports she fell from bed around 0400; landed on hardwood flooring C/o right knee pain Denies head inj/LOC Pain increases when she bears wt Brought back by wheelchair Alert, no signs of acute distress.

## 2014-05-14 NOTE — Discharge Instructions (Signed)
Use ice and ace and medicine as needed, activity as tolerated.

## 2014-05-14 NOTE — ED Provider Notes (Signed)
CSN: [id number]     Arrival date & time 05/14/14  1521 History   None    Chief Complaint  Patient presents with  . Fall  . Knee Injury   (Consider location/radiation/quality/duration/timing/severity/associated sxs/prior Treatment) Patient is a 68 y.o. female presenting with fall. The history is provided by the patient.  Fall This is a new problem. The current episode started 6 to 12 hours ago (fell out of bed landed on hard floor with developing pain in right knee.). The problem has not changed since onset.   Past Medical History  Diagnosis Date  . Hypertension   . DVT (deep venous thrombosis)   . Pulmonary embolism   . Diabetes mellitus   . Seizures   . Allergic rhinitis   . Chronic headache   . Pulmonary hypertension   . Dyspnea   . Asthma   . Arthritis   . Allergy   . Heart murmur    Past Surgical History  Procedure Laterality Date  . Knee surgery    . Plantar fascia surgery    . Partial hysterectomy    . Tonsillectomy    . Cardiac catheterization  12/28/2010    Mod. pulmonary hypertension, normal coronary arteries  . Doppler echocardiography  10/08/2011    EF=>55%,mild asymmetric LVH, mod. TR, mod. PH, mild to mod LA dilatation  . Nuclear stress test  05/20/2006    No ischemia  . Abdominal hysterectomy      partial, has ovaries   Family History  Problem Relation Age of Onset  . Hypertension Mother   . Clotting disorder Mother   . Breast cancer Mother   . Arthritis Mother   . Stroke Mother   . Diabetes Mother   . Allergies Other     grandson  . Cancer Brother   . Alcohol abuse Father   . Arthritis Sister   . Diabetes Sister   . Multiple sclerosis Sister    History  Substance Use Topics  . Smoking status: Never Smoker   . Smokeless tobacco: Never Used  . Alcohol Use: Yes     Comment: occ glass on wine   OB History   Grav Para Term Preterm Abortions TAB SAB Ect Mult Living                 Review of Systems  Constitutional: Negative.    Musculoskeletal: Positive for gait problem. Negative for back pain and joint swelling.  Skin: Negative.     Allergies  Penicillins; Phenytoin sodium extended; and Vicodin  Home Medications   Prior to Admission medications   Medication Sig Start Date End Date Taking? Authorizing Provider  aspirin 81 MG tablet Take 81 mg by mouth daily.     Yes Historical Provider, MD  Biotin 1000 MCG tablet Take 3,000 mcg by mouth daily.    Yes Historical Provider, MD  Cholecalciferol (VITAMIN D3) 2000 UNITS capsule Take 2,000 Units by mouth daily.   Yes Historical Provider, MD  Coenzyme Q10 200 MG capsule Take 200 mg by mouth daily.   Yes Historical Provider, MD  cyclobenzaprine (FLEXERIL) 10 MG tablet Take 10 mg by mouth daily as needed for muscle spasms.    Yes Historical Provider, MD  DULoxetine (CYMBALTA) 30 MG capsule Take 30 mg by mouth daily.   Yes Historical Provider, MD  fluticasone-salmeterol (ADVAIR HFA) 230-21 MCG/ACT inhaler Inhale 2 puffs into the lungs 2 (two) times daily.   Yes Historical Provider, MD  furosemide (LASIX) 40 MG tablet Take  40 mg by mouth daily.  04/08/13  Yes Mihai Croitoru, MD  Ginkgo Biloba 40 MG TABS Take 1 tablet by mouth 3 (three) times daily.   Yes Historical Provider, MD  Lactase (DAIRY-RELIEF PO) Take by mouth as needed.   Yes Historical Provider, MD  Magnesium 250 MG TABS Take 1 tablet by mouth daily.   Yes Historical Provider, MD  meloxicam (MOBIC) 15 MG tablet Take 15 mg by mouth daily.   Yes Historical Provider, MD  metFORMIN (GLUCOPHAGE) 500 MG tablet Take 1 tablet (500 mg total) by mouth 2 (two) times daily with a meal. 03/25/14  Yes Amy E Bedsole, MD  montelukast (SINGULAIR) 10 MG tablet Take 10 mg by mouth daily.     Yes Historical Provider, MD  Omega 3-6-9 Fatty Acids (OMEGA 3-6-9 COMPLEX PO) Take 2 capsules by mouth daily.   Yes Historical Provider, MD  pantoprazole (PROTONIX) 40 MG tablet Take 40 mg by mouth daily.   Yes Historical Provider, MD  valsartan  (DIOVAN) 80 MG tablet Take 80 mg by mouth daily.   Yes Historical Provider, MD  acetaminophen (TYLENOL) 325 MG tablet Take 650 mg by mouth every 4 (four) hours as needed. pain    Historical Provider, MD  albuterol (PROAIR HFA) 108 (90 BASE) MCG/ACT inhaler Inhale 2 puffs into the lungs every 6 (six) hours as needed. Shortness of breath    Historical Provider, MD  Azelastine-Fluticasone (DYMISTA) 137-50 MCG/ACT SUSP Place 1 puff into the nose 2 (two) times daily.    Historical Provider, MD  beclomethasone (QVAR) 80 MCG/ACT inhaler Inhale 2 puffs into the lungs 2 (two) times daily.      Historical Provider, MD  DiphenhydrAMINE HCl (BENADRYL PO) Take 1 tablet by mouth as needed.    Historical Provider, MD  Olopatadine HCl (PATADAY) 0.2 % SOLN Apply 1 drop to eye as needed.    Historical Provider, MD  phentermine 37.5 MG capsule Take 37.5 mg by mouth every morning.    Historical Provider, MD  Polyethyl Glycol-Propyl Glycol (SYSTANE OP) Apply 1-2 drops to eye as needed.    Historical Provider, MD  Potassium Chloride CR (MICRO-K) 8 MEQ CPCR capsule CR Take 8 mEq by mouth 2 (two) times daily.    Historical Provider, MD  promethazine (PHENERGAN) 25 MG tablet Take 25 mg by mouth every 6 (six) hours as needed for nausea or vomiting.    Historical Provider, MD  ranitidine (ZANTAC) 300 MG capsule Take 300 mg by mouth every evening.    Historical Provider, MD  sodium chloride (OCEAN) 0.65 % SOLN nasal spray Place 2 sprays into both nostrils as needed for congestion.    Historical Provider, MD  SUMAtriptan (IMITREX) 100 MG tablet Take 1 tablet (100 mg total) by mouth 2 (two) times daily as needed. migraine 03/02/14   Jearld Fenton, NP  traMADol (ULTRAM) 50 MG tablet Take 1 tablet (50 mg total) by mouth every 6 (six) hours as needed. 05/14/14   Billy Fischer, MD  vitamin E 400 UNIT capsule Take 800 Units by mouth daily.    Historical Provider, MD   BP 147/81  Pulse 72  Temp(Src) 97.8 F (36.6 C) (Oral)  Resp  16  SpO2 97% Physical Exam  Nursing note and vitals reviewed. Constitutional: She is oriented to person, place, and time. She appears well-developed and well-nourished. No distress.  Musculoskeletal: She exhibits tenderness.  Marked deg changes to right knee,no effusion, nl slr, ambulatory with cane.  Neurological: She is  alert and oriented to person, place, and time.  Skin: Skin is warm and dry.    ED Course  Procedures (including critical care time) Labs Review Labs Reviewed - No data to display  Imaging Review No results found.   MDM   1. Primary osteoarthritis of right knee        Billy Fischer, MD 05/14/14 [phone]

## 2014-05-18 DIAGNOSIS — M25561 Pain in right knee: Secondary | ICD-10-CM | POA: Diagnosis not present

## 2014-05-19 ENCOUNTER — Emergency Department (HOSPITAL_COMMUNITY)
Admission: EM | Admit: 2014-05-19 | Discharge: 2014-05-19 | Disposition: A | Discharge status: Home / Self Care | Payer: Medicare Other | Attending: Emergency Medicine | Admitting: Emergency Medicine

## 2014-05-19 ENCOUNTER — Encounter (HOSPITAL_COMMUNITY): Payer: Self-pay | Admitting: *Deleted

## 2014-05-19 ENCOUNTER — Telehealth: Payer: Self-pay | Admitting: Family Medicine

## 2014-05-19 ENCOUNTER — Emergency Department (HOSPITAL_COMMUNITY): Payer: Medicare Other

## 2014-05-19 DIAGNOSIS — Z86711 Personal history of pulmonary embolism: Secondary | ICD-10-CM | POA: Diagnosis not present

## 2014-05-19 DIAGNOSIS — R109 Unspecified abdominal pain: Secondary | ICD-10-CM

## 2014-05-19 DIAGNOSIS — G40909 Epilepsy, unspecified, not intractable, without status epilepticus: Secondary | ICD-10-CM | POA: Insufficient documentation

## 2014-05-19 DIAGNOSIS — Z7951 Long term (current) use of inhaled steroids: Secondary | ICD-10-CM | POA: Diagnosis not present

## 2014-05-19 DIAGNOSIS — R1110 Vomiting, unspecified: Secondary | ICD-10-CM | POA: Insufficient documentation

## 2014-05-19 DIAGNOSIS — Z86718 Personal history of other venous thrombosis and embolism: Secondary | ICD-10-CM | POA: Insufficient documentation

## 2014-05-19 DIAGNOSIS — Z791 Long term (current) use of non-steroidal anti-inflammatories (NSAID): Secondary | ICD-10-CM | POA: Diagnosis not present

## 2014-05-19 DIAGNOSIS — Z9889 Other specified postprocedural states: Secondary | ICD-10-CM | POA: Diagnosis not present

## 2014-05-19 DIAGNOSIS — R1013 Epigastric pain: Secondary | ICD-10-CM | POA: Insufficient documentation

## 2014-05-19 DIAGNOSIS — Z79899 Other long term (current) drug therapy: Secondary | ICD-10-CM | POA: Diagnosis not present

## 2014-05-19 DIAGNOSIS — Z880 Allergy status to penicillin: Secondary | ICD-10-CM | POA: Insufficient documentation

## 2014-05-19 DIAGNOSIS — J45909 Unspecified asthma, uncomplicated: Secondary | ICD-10-CM | POA: Diagnosis not present

## 2014-05-19 DIAGNOSIS — M1990 Unspecified osteoarthritis, unspecified site: Secondary | ICD-10-CM | POA: Diagnosis not present

## 2014-05-19 DIAGNOSIS — Z7982 Long term (current) use of aspirin: Secondary | ICD-10-CM | POA: Insufficient documentation

## 2014-05-19 DIAGNOSIS — R6889 Other general symptoms and signs: Secondary | ICD-10-CM | POA: Diagnosis not present

## 2014-05-19 DIAGNOSIS — Z90710 Acquired absence of both cervix and uterus: Secondary | ICD-10-CM | POA: Insufficient documentation

## 2014-05-19 DIAGNOSIS — R112 Nausea with vomiting, unspecified: Secondary | ICD-10-CM | POA: Diagnosis not present

## 2014-05-19 DIAGNOSIS — I10 Essential (primary) hypertension: Secondary | ICD-10-CM | POA: Diagnosis not present

## 2014-05-19 DIAGNOSIS — E119 Type 2 diabetes mellitus without complications: Secondary | ICD-10-CM | POA: Diagnosis not present

## 2014-05-19 DIAGNOSIS — R011 Cardiac murmur, unspecified: Secondary | ICD-10-CM | POA: Diagnosis not present

## 2014-05-19 LAB — COMPREHENSIVE METABOLIC PANEL
ALT: 16 U/L (ref 0–35)
AST: 22 U/L (ref 0–37)
Albumin: 3.6 g/dL (ref 3.5–5.2)
Alkaline Phosphatase: 64 U/L (ref 39–117)
Anion gap: 10 (ref 5–15)
BUN: 11 mg/dL (ref 6–23)
CO2: 25 mEq/L (ref 19–32)
Calcium: 9.4 mg/dL (ref 8.4–10.5)
Chloride: 103 mEq/L (ref 96–112)
Creatinine, Ser: 0.87 mg/dL (ref 0.50–1.10)
GFR calc Af Amer: 78 mL/min — ABNORMAL LOW (ref >90)
GFR calc non Af Amer: 67 mL/min — ABNORMAL LOW (ref >90)
Glucose, Bld: 139 mg/dL — ABNORMAL HIGH (ref 70–99)
Potassium: 4.2 mEq/L (ref 3.7–5.3)
Sodium: 138 mEq/L (ref 137–147)
Total Bilirubin: 0.6 mg/dL (ref 0.3–1.2)
Total Protein: 6.9 g/dL (ref 6.0–8.3)

## 2014-05-19 LAB — CBC WITH DIFFERENTIAL/PLATELET
Basophils Absolute: 0 10*3/uL (ref 0.0–0.1)
Basophils Relative: 0 % (ref 0–1)
Eosinophils Absolute: 0.2 10*3/uL (ref 0.0–0.7)
Eosinophils Relative: 2 % (ref 0–5)
HCT: 37.7 % (ref 36.0–46.0)
Hemoglobin: 12.4 g/dL (ref 12.0–15.0)
Lymphocytes Relative: 27 % (ref 12–46)
Lymphs Abs: 2.5 10*3/uL (ref 0.7–4.0)
MCH: 25.6 pg — ABNORMAL LOW (ref 26.0–34.0)
MCHC: 32.9 g/dL (ref 30.0–36.0)
MCV: 77.9 fL — ABNORMAL LOW (ref 78.0–100.0)
Monocytes Absolute: 0.7 10*3/uL (ref 0.1–1.0)
Monocytes Relative: 7 % (ref 3–12)
Neutro Abs: 6 10*3/uL (ref 1.7–7.7)
Neutrophils Relative %: 64 % (ref 43–77)
Platelets: 279 10*3/uL (ref 150–400)
RBC: 4.84 MIL/uL (ref 3.87–5.11)
RDW: 14.5 % (ref 11.5–15.5)
WBC: 9.4 10*3/uL (ref 4.0–10.5)

## 2014-05-19 LAB — URINALYSIS, ROUTINE W REFLEX MICROSCOPIC
Bilirubin Urine: NEGATIVE
Glucose, UA: NEGATIVE mg/dL
Ketones, ur: NEGATIVE mg/dL
Leukocytes, UA: NEGATIVE
Nitrite: NEGATIVE
Protein, ur: NEGATIVE mg/dL
Specific Gravity, Urine: 1.012 (ref 1.005–1.030)
Urobilinogen, UA: 1 mg/dL (ref 0.0–1.0)
pH: 6 (ref 5.0–8.0)

## 2014-05-19 LAB — LIPASE, BLOOD: Lipase: 11 U/L (ref 11–59)

## 2014-05-19 LAB — URINE MICROSCOPIC-ADD ON

## 2014-05-19 MED ORDER — SODIUM CHLORIDE 0.9 % IV BOLUS (SEPSIS)
1000.0000 mL | Freq: Once | INTRAVENOUS | Status: AC
  Administered 2014-05-19: 1000 mL via INTRAVENOUS

## 2014-05-19 MED ORDER — ONDANSETRON 4 MG PO TBDP: 4.0000 mg | ORAL_TABLET | Freq: Three times a day (TID) | ORAL | Status: DC | PRN

## 2014-05-19 MED ORDER — ONDANSETRON HCL 4 MG/2ML IJ SOLN
4.0000 mg | Freq: Once | INTRAMUSCULAR | Status: AC
  Administered 2014-05-19: 4 mg via INTRAVENOUS
  Filled 2014-05-19: qty 2

## 2014-05-19 MED ORDER — IOHEXOL 300 MG/ML  SOLN
100.0000 mL | Freq: Once | INTRAMUSCULAR | Status: AC | PRN
  Administered 2014-05-19: 100 mL via INTRAVENOUS

## 2014-05-19 MED ORDER — MORPHINE SULFATE 4 MG/ML IJ SOLN
4.0000 mg | Freq: Once | INTRAMUSCULAR | Status: AC
  Administered 2014-05-19: 4 mg via INTRAVENOUS
  Filled 2014-05-19: qty 1

## 2014-05-19 MED ORDER — IOHEXOL 300 MG/ML  SOLN
50.0000 mL | Freq: Once | INTRAMUSCULAR | Status: AC | PRN
  Administered 2014-05-19: 50 mL via ORAL

## 2014-05-19 NOTE — ED Notes (Addendum)
Per EMS, pt has had nausea and vomiting since Saturday. Pt vomited 3 times today. Pt was given 8mg  zofran en route to hospital, which she states has helped. Pt states she has 5/10 pain in her abdomen, which started after vomiting and a "slight headache". Pt denies diarrhea.

## 2014-05-19 NOTE — Discharge Instructions (Signed)
Take zofran as needed for nausea. Refer to attached documents for more information. Follow up with your doctor for further evaluation. Return to the ED with worsening or concerning symptoms.

## 2014-05-19 NOTE — ED Notes (Signed)
Bed: WA21 Expected date:  Expected time:  Means of arrival:  Comments: EMS-n/v 

## 2014-05-19 NOTE — ED Provider Notes (Signed)
CSN: [id number]     Arrival date & time 05/19/14  1506 History   First MD Initiated Contact with Patient 05/19/14 1508     Chief Complaint  Patient presents with  . Nausea  . Emesis     (Consider location/radiation/quality/duration/timing/severity/associated sxs/prior Treatment) HPI Comments: Patient is a 68 year old female with a past medical history of diabetes, asthma, hypertension, and GERD who presents with abdominal pain for the past 3 days. The pain is located in her epigastrim and does not radiate. The pain is described as aching and moderate. The pain started gradually and progressively worsened since the onset. No alleviating/aggravating factors. The patient has tried nohting for symptoms without relief. Associated symptoms include nausea since the onset of pain and 3 episodes of emesis today . Patient denies fever, headache, diarrhea, chest pain, SOB, dysuria, constipation.    Past Medical History  Diagnosis Date  . Hypertension   . DVT (deep venous thrombosis)   . Pulmonary embolism   . Diabetes mellitus   . Seizures   . Allergic rhinitis   . Chronic headache   . Pulmonary hypertension   . Dyspnea   . Asthma   . Arthritis   . Allergy   . Heart murmur    Past Surgical History  Procedure Laterality Date  . Knee surgery    . Plantar fascia surgery    . Partial hysterectomy    . Tonsillectomy    . Cardiac catheterization  12/28/2010    Mod. pulmonary hypertension, normal coronary arteries  . Doppler echocardiography  10/08/2011    EF=>55%,mild asymmetric LVH, mod. TR, mod. PH, mild to mod LA dilatation  . Nuclear stress test  05/20/2006    No ischemia  . Abdominal hysterectomy      partial, has ovaries   Family History  Problem Relation Age of Onset  . Hypertension Mother   . Clotting disorder Mother   . Breast cancer Mother   . Arthritis Mother   . Stroke Mother   . Diabetes Mother   . Allergies Other     grandson  . Cancer Brother   . Alcohol abuse  Father   . Arthritis Sister   . Diabetes Sister   . Multiple sclerosis Sister    History  Substance Use Topics  . Smoking status: Never Smoker   . Smokeless tobacco: Never Used  . Alcohol Use: Yes     Comment: occ glass on wine   OB History    No data available     Review of Systems  Constitutional: Negative for fever, chills and fatigue.  HENT: Negative for trouble swallowing.   Eyes: Negative for visual disturbance.  Respiratory: Negative for shortness of breath.   Cardiovascular: Negative for chest pain and palpitations.  Gastrointestinal: Positive for nausea, vomiting and abdominal pain. Negative for diarrhea.  Genitourinary: Negative for dysuria and difficulty urinating.  Musculoskeletal: Negative for arthralgias and neck pain.  Skin: Negative for color change.  Neurological: Negative for dizziness and weakness.  Psychiatric/Behavioral: Negative for dysphoric mood.      Allergies  Lyrica; Penicillins; Phenytoin sodium extended; and Vicodin  Home Medications   Prior to Admission medications   Medication Sig Start Date End Date Taking? Authorizing Provider  acetaminophen (TYLENOL) 325 MG tablet Take 650 mg by mouth every 6 (six) hours as needed for moderate pain (pain). pain   Yes Historical Provider, MD  albuterol (PROAIR HFA) 108 (90 BASE) MCG/ACT inhaler Inhale 2 puffs into the lungs every  6 (six) hours as needed for wheezing (wheezing). Shortness of breath   Yes Historical Provider, MD  aspirin 81 MG tablet Take 81 mg by mouth daily.     Yes Historical Provider, MD  Azelastine-Fluticasone (DYMISTA) 137-50 MCG/ACT SUSP Place 1 puff into the nose 2 (two) times daily.   Yes Historical Provider, MD  beclomethasone (QVAR) 80 MCG/ACT inhaler Inhale 2 puffs into the lungs 2 (two) times daily.     Yes Historical Provider, MD  Biotin 1000 MCG tablet Take 3,000 mcg by mouth daily.    Yes Historical Provider, MD  Cholecalciferol (VITAMIN D3) 2000 UNITS capsule Take 2,000  Units by mouth daily.   Yes Historical Provider, MD  Coenzyme Q10 200 MG capsule Take 200 mg by mouth daily.   Yes Historical Provider, MD  DiphenhydrAMINE HCl (BENADRYL PO) Take 1 tablet by mouth daily as needed (itching).    Yes Historical Provider, MD  DULoxetine (CYMBALTA) 30 MG capsule Take 30 mg by mouth daily.   Yes Historical Provider, MD  fluticasone-salmeterol (ADVAIR HFA) 230-21 MCG/ACT inhaler Inhale 2 puffs into the lungs 2 (two) times daily.   Yes Historical Provider, MD  furosemide (LASIX) 40 MG tablet Take 40 mg by mouth daily.  04/08/13  Yes Mihai Croitoru, MD  Ginkgo Biloba 40 MG TABS Take 1 tablet by mouth 3 (three) times daily.   Yes Historical Provider, MD  Magnesium 250 MG TABS Take 1 tablet by mouth daily.   Yes Historical Provider, MD  meloxicam (MOBIC) 15 MG tablet Take 15 mg by mouth daily.   Yes Historical Provider, MD  metFORMIN (GLUCOPHAGE) 500 MG tablet Take 1 tablet (500 mg total) by mouth 2 (two) times daily with a meal. 03/25/14  Yes Amy E Bedsole, MD  montelukast (SINGULAIR) 10 MG tablet Take 10 mg by mouth daily.     Yes Historical Provider, MD  Olopatadine HCl (PATADAY) 0.2 % SOLN Apply 1 drop to eye daily as needed (dry eyes).    Yes Historical Provider, MD  Omega 3-6-9 Fatty Acids (OMEGA 3-6-9 COMPLEX PO) Take 2 capsules by mouth daily.   Yes Historical Provider, MD  ondansetron (ZOFRAN) 4 MG tablet Take 4 mg by mouth every 8 (eight) hours as needed for nausea or vomiting (nausea).   Yes Historical Provider, MD  pantoprazole (PROTONIX) 40 MG tablet Take 40 mg by mouth daily.   Yes Historical Provider, MD  phentermine 37.5 MG capsule Take 37.5 mg by mouth every morning.   Yes Historical Provider, MD  Polyethyl Glycol-Propyl Glycol (SYSTANE OP) Apply 2 drops to eye daily as needed (dry eyes).    Yes Historical Provider, MD  Potassium Chloride CR (MICRO-K) 8 MEQ CPCR capsule CR Take 8 mEq by mouth 2 (two) times daily.   Yes Historical Provider, MD  promethazine  (PHENERGAN) 25 MG tablet Take 25 mg by mouth every 6 (six) hours as needed for nausea or vomiting (nausea).    Yes Historical Provider, MD  ranitidine (ZANTAC) 300 MG capsule Take 300 mg by mouth every evening.   Yes Historical Provider, MD  sodium chloride (OCEAN) 0.65 % SOLN nasal spray Place 2 sprays into both nostrils daily as needed for congestion (congestion).    Yes Historical Provider, MD  SUMAtriptan (IMITREX) 100 MG tablet Take 1 tablet (100 mg total) by mouth 2 (two) times daily as needed. migraine 03/02/14  Yes Jearld Fenton, NP  traMADol (ULTRAM) 50 MG tablet Take 1 tablet (50 mg total) by mouth every  6 (six) hours as needed. 05/14/14  Yes Billy Fischer, MD  valsartan (DIOVAN) 80 MG tablet Take 80 mg by mouth daily.   Yes Historical Provider, MD  vitamin E 400 UNIT capsule Take 800 Units by mouth daily.   Yes Historical Provider, MD  cyclobenzaprine (FLEXERIL) 10 MG tablet Take 10 mg by mouth daily as needed for muscle spasms.     Historical Provider, MD  Lactase (DAIRY-RELIEF PO) Take 1 capsule by mouth daily as needed.     Historical Provider, MD   BP 146/81 mmHg  Pulse 66  Temp(Src) 97.7 F (36.5 C) (Oral)  Resp 20  SpO2 100% Physical Exam  Constitutional: She is oriented to person, place, and time. She appears well-developed and well-nourished. No distress.  HENT:  Head: Normocephalic and atraumatic.  Eyes: Conjunctivae and EOM are normal.  Neck: Normal range of motion.  Cardiovascular: Normal rate and regular rhythm.  Exam reveals no gallop and no friction rub.   No murmur heard. Pulmonary/Chest: Effort normal and breath sounds normal. She has no wheezes. She has no rales. She exhibits no tenderness.  Abdominal: Soft. She exhibits no distension. There is tenderness. There is no rebound and no guarding.  Epigastric tenderness to palpation. No other focal tenderness to palpation. No peritoneal signs.   Musculoskeletal: Normal range of motion.  Neurological: She is alert  and oriented to person, place, and time. Coordination normal.  Speech is goal-oriented. Moves limbs without ataxia.   Skin: Skin is warm and dry.  Psychiatric: She has a normal mood and affect. Her behavior is normal.  Nursing note and vitals reviewed.   ED Course  Procedures (including critical care time) Labs Review Labs Reviewed  CBC WITH DIFFERENTIAL - Abnormal; Notable for the following:    MCV 77.9 (*)    MCH 25.6 (*)    All other components within normal limits  COMPREHENSIVE METABOLIC PANEL - Abnormal; Notable for the following:    Glucose, Bld 139 (*)    GFR calc non Af Amer 67 (*)    GFR calc Af Amer 78 (*)    All other components within normal limits  URINALYSIS, ROUTINE W REFLEX MICROSCOPIC - Abnormal; Notable for the following:    Hgb urine dipstick TRACE (*)    All other components within normal limits  LIPASE, BLOOD  URINE MICROSCOPIC-ADD ON    Imaging Review Ct Abdomen Pelvis W Contrast  05/19/2014   CLINICAL DATA:  Initial encounter for a 4 day history of nausea and vomiting along with pain in the abdomen.  EXAM: CT ABDOMEN AND PELVIS WITH CONTRAST  TECHNIQUE: Multidetector CT imaging of the abdomen and pelvis was performed using the standard protocol following bolus administration of intravenous contrast.  CONTRAST:  58mL OMNIPAQUE IOHEXOL 300 MG/ML SOLN, 137mL OMNIPAQUE IOHEXOL 300 MG/ML SOLN  COMPARISON:  02/01/2012.  06/25/2003.  FINDINGS: Lower chest:  Unremarkable.  Hepatobiliary: No focal abnormality within the liver parenchyma. There is no evidence for gallstones, gallbladder wall thickening, or pericholecystic fluid. No intrahepatic or extrahepatic biliary dilation.  Pancreas: No focal mass lesion. No dilatation of the main duct. No intraparenchymal cyst. No peripancreatic edema.  Spleen: No splenomegaly. No focal mass lesion.  Adrenals/Urinary Tract: No adrenal nodule or mass. Focal areas of cortical scarring are seen in the right kidney. 8 mm low-density  lesion in the lower pole the right kidney with 6 mm on the study from 2 years ago. This is likely a cyst. Left kidney is unremarkable. The  ureters are nondilated. The bladder is unremarkable.  Stomach/Bowel: Stomach is nondistended. No gastric wall thickening. No evidence of outlet obstruction. Duodenum is normally positioned as is the ligament of Treitz. No small bowel wall thickening. No small bowel dilatation. Terminal ileum is normal. The appendix is not visualized, but there is no edema or inflammation in the region of the cecum. No gross colonic mass. No colonic wall thickening. No substantial diverticular change.  Vascular/Lymphatic: No abdominal aortic aneurysm. There is no gastrohepatic or hepatoduodenal ligament lymphadenopathy. No retroperitoneal lymphadenopathy. No evidence for pelvic sidewall lymphadenopathy.  Reproductive: Uterus is surgically absent. No left adnexal. In the right adnexal region, a 3.6 x 2.4 cm coarsely calcified structure is identified. This was present previously and measured 4.5 x 2.2 cm. This was also present on the study from 11 years ago and measured 4.3 x 2.2 cm at that time. This is most consistent with a benign process.  Other: No intraperitoneal free fluid.  Musculoskeletal: Bone windows reveal no worrisome lytic or sclerotic osseous lesions.  IMPRESSION: Stable exam. No new or acute interval findings. Specifically, no findings to explain the patient's history of nausea and vomiting.   Electronically Signed   By: Misty Stanley M.D.   On: 05/19/2014 17:48     EKG Interpretation None      MDM   Final diagnoses:  Abdominal pain  Vomiting    5:26 PM Labs unremarkable for acute changes. Urinalysis shows trace hemoglobin. Patient given morphine and zofran for symptoms. Patient will have CT abdomen pelvis. Vitals stable and patient afebrile.  7:23 PM Patient's CT unremarkable for acute changes. Patient is able to tolerate PO ginger ale. Patient will have zofran  prescription and instructions to follow up with PCP. No further evaluation needed at this time. Patient instructed to return with worsening or concerning symptoms. Vitals stable and patient afebrile.   Alvina Chou, PA-C 05/19/14 [place], MD 05/19/14 2317

## 2014-05-19 NOTE — Telephone Encounter (Signed)
Patient Information:  Caller Name: [place]  Phone: [phone]  Patient: [name], [name]  Gender: Female  DOB: 04/22/1946  Age: 68 Years  PCP: [name] (Family Practice)  Office Follow Up:  Does the office need to follow up with this patient?: No  Instructions For The Office: N/A  RN Note:  Patient states she developed vomiting, onset 05/15/14 after taking Tramadol. Patient states she has not taken Tramadol since 05/16/14. Patient states she continues to have vomiting. Patient states she spoke with the office of Dr. Herbie [place] Buccini/ [facility] 05/18/14 and was advised to increase Pantoprazole 40mg . to BID and patient was prescribed Zofran. Patient states she continues to have vomiting that is unrelieved by Zofran. Patient states she again called the office of Dr. Herbie Baltimore Buccini/Gastroenterologist 05/19/14 and patient was advised to call her PCP. Patient states emesis X 2 05/19/14. Patient states her last emesis was "deep green" in color at approx. 1230 05/19/14.  Patient denies diarrhea. States last bowel movement was 05/19/14, soft, brown, normal for patient. Urinating normally for patient. States urine is dark yellow in color, at intervals.  Care advice given per guidelines. Patient advised not to have anything to eat or drink.  RN spoke with Dee/office Triage Nurse. Authorization given, per [facility] in office, for patient to go to the ED for evaluation.  Patient informed of above and agreeable. Patient advised not to drive self.  Patient states she will go to the ED at [facility] for evaluation.  Symptoms  Reason For Call & Symptoms: Vomiting  Reviewed Health History In EMR: Yes  Reviewed Medications In EMR: Yes  Reviewed Allergies In EMR: Yes  Reviewed Surgeries / Procedures: Yes  Date of Onset of Symptoms: 05/15/2014  Treatments Tried: Pantoprozole, Zofran, Phenergan  Treatments Tried Worked: No  Guideline(s) Used:  Vomiting  Disposition Per Guideline:   Go to ED  Now  Reason For Disposition Reached:   Vomiting contains bile (green color)  Advice Given:  Call Back If:  You become worse.  Patient Will Follow Care Advice:  YES

## 2014-05-19 NOTE — Telephone Encounter (Signed)
Pt called stating she could not get transportation to go to er.  She stated she was calling ems to take her

## 2014-05-20 NOTE — Telephone Encounter (Signed)
Noted  

## 2014-06-04 DIAGNOSIS — J4540 Moderate persistent asthma, uncomplicated: Secondary | ICD-10-CM | POA: Diagnosis not present

## 2014-06-04 DIAGNOSIS — J309 Allergic rhinitis, unspecified: Secondary | ICD-10-CM | POA: Diagnosis not present

## 2014-06-05 DIAGNOSIS — M25561 Pain in right knee: Secondary | ICD-10-CM | POA: Diagnosis not present

## 2014-06-07 ENCOUNTER — Ambulatory Visit: Payer: Federal, State, Local not specified - PPO | Admitting: Family Medicine

## 2014-06-07 ENCOUNTER — Ambulatory Visit (INDEPENDENT_AMBULATORY_CARE_PROVIDER_SITE_OTHER): Payer: Medicare Other | Admitting: Family Medicine

## 2014-06-07 ENCOUNTER — Encounter: Payer: Self-pay | Admitting: Family Medicine

## 2014-06-07 VITALS — BP 143/86 | HR 80 | Temp 97.5°F | Ht 64.5 in | Wt 238.8 lb

## 2014-06-07 DIAGNOSIS — B3731 Acute candidiasis of vulva and vagina: Secondary | ICD-10-CM | POA: Insufficient documentation

## 2014-06-07 DIAGNOSIS — J4540 Moderate persistent asthma, uncomplicated: Secondary | ICD-10-CM | POA: Diagnosis not present

## 2014-06-07 DIAGNOSIS — I10 Essential (primary) hypertension: Secondary | ICD-10-CM

## 2014-06-07 DIAGNOSIS — B373 Candidiasis of vulva and vagina: Secondary | ICD-10-CM | POA: Diagnosis not present

## 2014-06-07 DIAGNOSIS — J309 Allergic rhinitis, unspecified: Secondary | ICD-10-CM | POA: Diagnosis not present

## 2014-06-07 MED ORDER — FLUCONAZOLE 150 MG PO TABS: 150.0000 mg | ORAL_TABLET | Freq: Once | ORAL | Status: DC

## 2014-06-07 NOTE — Assessment & Plan Note (Signed)
Hole phentermine. Encouraged exercise, weight loss, healthy eating habits.  Follow BP at home  on valsartan.

## 2014-06-07 NOTE — Progress Notes (Signed)
   Subjective:    Patient ID: [name], female    DOB: 11-29-1945, 68 y.o.   MRN: [id number]  HPI  67 year old female presents for follow up BP and follow up ER visit.  She is on phentermine for weight loss. Wt Readings from Last 3 Encounters:  06/07/14 238 lb 12 oz (108.296 kg)  04/23/14 235 lb 8 oz (106.822 kg)  03/25/14 237 lb 8 oz (107.729 kg)   Hypertension:  She was seen at ER on 11/4 for abdominal pain. Her BP was measured to be 160/75 at entry and 119/93 after meds.  Had abd CT unremarkable, labs unremarkable Treated with morphine and zofran.  She has no further abdominal pain. She was also seen at asthma allergy [facility]. 160/98. BP well controlled on valsartan here today. BP Readings from Last 3 Encounters:  06/07/14 134/80  05/19/14 119/93  05/14/14 147/81  Using medication without problems or lightheadedness: none Chest pain with exertion:None Edema:None Short of breath:None Average home BPs: NO checking. Other issues:   She has been using off and on. She did take it before seeing allergist. She did not take it today or yesterday. Started at [facility] for sinus infection with antibiotics, now has vaginal itching, discharge. Request diflucan.     Review of Systems  Constitutional: Negative for fever and fatigue.  HENT: Negative for ear pain.   Eyes: Negative for pain.  Respiratory: Negative for chest tightness and shortness of breath.   Cardiovascular: Negative for chest pain, palpitations and leg swelling.  Gastrointestinal: Negative for abdominal pain.  Genitourinary: Negative for dysuria.       Objective:   Physical Exam  Constitutional: Vital signs are normal. She appears well-developed and well-nourished. She is cooperative.  Non-toxic appearance. She does not appear ill. No distress.  HENT:  Head: Normocephalic.  Right Ear: Hearing, tympanic membrane, external ear and ear canal normal. Tympanic membrane is not erythematous, not retracted and not bulging.    Left Ear: Hearing, tympanic membrane, external ear and ear canal normal. Tympanic membrane is not erythematous, not retracted and not bulging.  Nose: No mucosal edema or rhinorrhea. Right sinus exhibits no maxillary sinus tenderness and no frontal sinus tenderness. Left sinus exhibits no maxillary sinus tenderness and no frontal sinus tenderness.  Mouth/Throat: Uvula is midline, oropharynx is clear and moist and mucous membranes are normal.  Eyes: Conjunctivae, EOM and lids are normal. Pupils are equal, round, and reactive to light. Lids are everted and swept, no foreign bodies found.  Neck: Trachea normal and normal range of motion. Neck supple. Carotid bruit is not present. No thyroid mass and no thyromegaly present.  Cardiovascular: Normal rate, regular rhythm, S1 normal, S2 normal, normal heart sounds, intact distal pulses and normal pulses.  Exam reveals no gallop and no friction rub.   No murmur heard. Pulmonary/Chest: Effort normal and breath sounds normal. No tachypnea. No respiratory distress. She has no decreased breath sounds. She has no wheezes. She has no rhonchi. She has no rales.  Abdominal: Soft. Normal appearance and bowel sounds are normal. There is no tenderness.  Neurological: She is alert.  Skin: Skin is warm, dry and intact. No rash noted.  Psychiatric: Her speech is normal and behavior is normal. Judgment and thought content normal. Her mood appears not anxious. Cognition and memory are normal. She does not exhibit a depressed mood.          Assessment & Plan:

## 2014-06-07 NOTE — Progress Notes (Signed)
Pre visit review using our clinic review tool, if applicable. No additional management support is needed unless otherwise documented below in the visit note. 

## 2014-06-07 NOTE — Patient Instructions (Signed)
Stop phentermine. Get back on track with healthy eating, exercise as tolerated and work on weight loss. Follow BP at home, goal < 140/90.

## 2014-06-07 NOTE — Assessment & Plan Note (Signed)
Secondary to recent antibitoics. Treat with diflucan.

## 2014-06-09 DIAGNOSIS — M25561 Pain in right knee: Secondary | ICD-10-CM | POA: Diagnosis not present

## 2014-06-24 ENCOUNTER — Other Ambulatory Visit: Payer: Self-pay | Admitting: Family Medicine

## 2014-06-25 ENCOUNTER — Telehealth: Payer: Self-pay | Admitting: Cardiovascular Disease

## 2014-06-25 NOTE — Telephone Encounter (Signed)
Pt is calling the nurse back to speak to them about having a test for her blood clots. Please call  Thanks

## 2014-06-25 NOTE — Telephone Encounter (Signed)
Called line, NA.

## 2014-06-25 NOTE — Telephone Encounter (Signed)
Having right knee arthroscopic surgery Friday 12/18.  Wanted to know if she should have another doppler study done before the surgery.  Told patient we would not do another doppler this soon unless it was medically necessary.  This time she is getting a shot prior to surgery to thin her blood and she will also take ASA daily.  Patient voiced understanding.

## 2014-06-25 NOTE — Telephone Encounter (Signed)
[name] is calling because she wants to speak to Dr.Croitoru or his nurse about a test for blood Clots . Please call    Thanks

## 2014-07-02 DIAGNOSIS — M1990 Unspecified osteoarthritis, unspecified site: Secondary | ICD-10-CM | POA: Diagnosis not present

## 2014-07-02 DIAGNOSIS — Y929 Unspecified place or not applicable: Secondary | ICD-10-CM | POA: Diagnosis not present

## 2014-07-02 DIAGNOSIS — M659 Synovitis and tenosynovitis, unspecified: Secondary | ICD-10-CM | POA: Diagnosis not present

## 2014-07-02 DIAGNOSIS — X58XXXA Exposure to other specified factors, initial encounter: Secondary | ICD-10-CM | POA: Diagnosis not present

## 2014-07-02 DIAGNOSIS — S83261A Peripheral tear of lateral meniscus, current injury, right knee, initial encounter: Secondary | ICD-10-CM | POA: Diagnosis not present

## 2014-07-02 DIAGNOSIS — M1711 Unilateral primary osteoarthritis, right knee: Secondary | ICD-10-CM | POA: Diagnosis not present

## 2014-07-02 DIAGNOSIS — M179 Osteoarthritis of knee, unspecified: Secondary | ICD-10-CM | POA: Diagnosis not present

## 2014-07-02 DIAGNOSIS — G8918 Other acute postprocedural pain: Secondary | ICD-10-CM | POA: Diagnosis not present

## 2014-07-02 DIAGNOSIS — S83241A Other tear of medial meniscus, current injury, right knee, initial encounter: Secondary | ICD-10-CM | POA: Diagnosis not present

## 2014-07-02 DIAGNOSIS — M23300 Other meniscus derangements, unspecified lateral meniscus, right knee: Secondary | ICD-10-CM | POA: Diagnosis not present

## 2014-07-02 DIAGNOSIS — M23303 Other meniscus derangements, unspecified medial meniscus, right knee: Secondary | ICD-10-CM | POA: Diagnosis not present

## 2014-07-15 DIAGNOSIS — Z4789 Encounter for other orthopedic aftercare: Secondary | ICD-10-CM | POA: Diagnosis not present

## 2014-07-30 DIAGNOSIS — M7752 Other enthesopathy of left foot: Secondary | ICD-10-CM | POA: Diagnosis not present

## 2014-07-30 DIAGNOSIS — M25572 Pain in left ankle and joints of left foot: Secondary | ICD-10-CM | POA: Diagnosis not present

## 2014-07-30 DIAGNOSIS — M216X2 Other acquired deformities of left foot: Secondary | ICD-10-CM | POA: Diagnosis not present

## 2014-08-12 DIAGNOSIS — Z4789 Encounter for other orthopedic aftercare: Secondary | ICD-10-CM | POA: Diagnosis not present

## 2014-08-19 DIAGNOSIS — R197 Diarrhea, unspecified: Secondary | ICD-10-CM | POA: Diagnosis not present

## 2014-08-20 DIAGNOSIS — J4540 Moderate persistent asthma, uncomplicated: Secondary | ICD-10-CM | POA: Diagnosis not present

## 2014-08-23 ENCOUNTER — Telehealth: Payer: Self-pay | Admitting: Family Medicine

## 2014-08-23 DIAGNOSIS — J4540 Moderate persistent asthma, uncomplicated: Secondary | ICD-10-CM | POA: Diagnosis not present

## 2014-08-23 NOTE — Telephone Encounter (Signed)
[facility] Patient Name: Jonella Bundrick DOB: [birth year] Initial Comment Caller States she was vomiting over the weekend, but not today. has appt with the dr tomorrow but was advised to speak to a nurse today Nurse Assessment Nurse: Ronnald Ramp, RN, Miranda Date/Time Eilene Ghazi Time): 08/23/2014 2:03:47 PM Confirm and document reason for call. If symptomatic, describe symptoms. ---Caller states she was vomiting on Saturday into Sunday morning. No vomiting in 24 hours. Denies diarrhea. She also has a headache off and on for 1 week. Has the patient traveled out of the country within the last 30 days? ---No Does the patient require triage? ---Yes Related visit to physician within the last 2 weeks? ---No Does the PT have any chronic conditions? (i.e. diabetes, asthma, etc.) ---Yes List chronic conditions. ---Asthma, GERD, Diabetes, HTN, Migraines Guidelines Guideline Title Affirmed Question Affirmed Notes Headache [1] MODERATE headache (e.g., interferes with normal activities) AND [2] present > 24 hours AND [3] unexplained (Exceptions: analgesics not tried, typical migraine, or headache part of viral illness) Vomiting MILD or MODERATE vomiting (e.g., 1 - 5 times / day) (all triage questions negative) Final Disposition User See Physician within 24 Hours Jones, RN, Miranda Comments Pt already has an appt scheduled for tomorrow. Told to keep appt with Dr. Beckey Rutter at 1:45

## 2014-08-23 NOTE — Telephone Encounter (Signed)
Pt has appt with Dr Diona Browner on 08/24/14 at 1:45 pm.

## 2014-08-24 ENCOUNTER — Encounter: Payer: Self-pay | Admitting: Family Medicine

## 2014-08-24 ENCOUNTER — Ambulatory Visit (INDEPENDENT_AMBULATORY_CARE_PROVIDER_SITE_OTHER): Payer: Medicare Other | Admitting: Family Medicine

## 2014-08-24 VITALS — BP 120/80 | HR 93 | Temp 98.2°F | Ht 64.5 in | Wt 234.0 lb

## 2014-08-24 DIAGNOSIS — G43009 Migraine without aura, not intractable, without status migrainosus: Secondary | ICD-10-CM | POA: Insufficient documentation

## 2014-08-24 DIAGNOSIS — R112 Nausea with vomiting, unspecified: Secondary | ICD-10-CM | POA: Diagnosis not present

## 2014-08-24 DIAGNOSIS — M25561 Pain in right knee: Secondary | ICD-10-CM | POA: Diagnosis not present

## 2014-08-24 MED ORDER — SUMATRIPTAN SUCCINATE 100 MG PO TABS: ORAL_TABLET | ORAL | Status: DC

## 2014-08-24 NOTE — Assessment & Plan Note (Signed)
Refilled imitrex to treat as needed.  Nml neuro exam today. No red flags.

## 2014-08-24 NOTE — Progress Notes (Signed)
Subjective:    Patient ID: [name], female    DOB: [birth year], 69 y.o.   MRN: [id number]  HPI 69 year old female with history of pulm HTN, depression, DM, HTN, DVT/PE  presents with new onset emesis  on  12/6 and 12/7.  She took some zofran that she had.   She feels better some now. No emesis in last 48 hours.  She has felt more tired lately. No abdominal pain, no diarrhea, some increase in gas. She has been able to eat soup, drinks water and ginger ale. No sick contacts except was at [facility] day before.   She has been having headache in face, sinus pressure, nasal congestion, no ear pain, no fever. Ongoing for for 1-1.5 weeks. She allergist 4 days ago. changed to nasal steroid every other day.   She has hx of migraine, uses imitrex, but ran out so could not take. Tylenol  3 helped some with pain.  Headache proceeded N/V and may be connected.  BP Readings from Last 3 Encounters:  08/24/14 120/80  06/07/14 143/86  05/19/14 119/93     Review of Systems  Constitutional: Negative for fever and fatigue.  HENT: Negative for ear pain.   Eyes: Negative for pain.  Respiratory: Negative for chest tightness and shortness of breath.   Cardiovascular: Negative for chest pain, palpitations and leg swelling.  Gastrointestinal: Negative for abdominal pain.  Genitourinary: Negative for dysuria.       Objective:   Physical Exam  Constitutional: She is oriented to person, place, and time. Vital signs are normal. She appears well-developed and well-nourished. She is cooperative.  Non-toxic appearance. She does not appear ill. No distress.  HENT:  Head: Normocephalic.  Right Ear: Hearing, tympanic membrane, external ear and ear canal normal. Tympanic membrane is not erythematous, not retracted and not bulging.  Left Ear: Hearing, tympanic membrane, external ear and ear canal normal. Tympanic membrane is not erythematous, not retracted and not bulging.  Nose: No mucosal edema or  rhinorrhea. Right sinus exhibits no maxillary sinus tenderness and no frontal sinus tenderness. Left sinus exhibits no maxillary sinus tenderness and no frontal sinus tenderness.  Mouth/Throat: Uvula is midline, oropharynx is clear and moist and mucous membranes are normal.  Eyes: Conjunctivae, EOM and lids are normal. Pupils are equal, round, and reactive to light. Lids are everted and swept, no foreign bodies found.  Neck: Trachea normal and normal range of motion. Neck supple. Carotid bruit is not present. No thyroid mass and no thyromegaly present.  Cardiovascular: Normal rate, regular rhythm, S1 normal, S2 normal, normal heart sounds, intact distal pulses and normal pulses.  Exam reveals no gallop and no friction rub.   No murmur heard. Pulmonary/Chest: Effort normal and breath sounds normal. No tachypnea. No respiratory distress. She has no decreased breath sounds. She has no wheezes. She has no rhonchi. She has no rales.  Abdominal: Soft. Normal appearance and bowel sounds are normal. There is no tenderness.  Neurological: She is alert and oriented to person, place, and time. She has normal strength and normal reflexes. No cranial nerve deficit or sensory deficit. She exhibits normal muscle tone. She displays a negative Romberg sign. Coordination and gait normal. GCS eye subscore is 4. GCS verbal subscore is 5. GCS motor subscore is 6.  Nml cerebellar exam   No papilledema  Skin: Skin is warm, dry and intact. No rash noted.  Psychiatric: She has a normal mood and affect. Her speech is normal  and behavior is normal. Judgment and thought content normal. Her mood appears not anxious. Cognition and memory are normal. Cognition and memory are not impaired. She does not exhibit a depressed mood. She exhibits normal recent memory and normal remote memory.          Assessment & Plan:

## 2014-08-24 NOTE — Progress Notes (Signed)
Pre visit review using our clinic review tool, if applicable. No additional management support is needed unless otherwise documented below in the visit note. 

## 2014-08-24 NOTE — Assessment & Plan Note (Signed)
Resolved now, may likely have been Viral GE or due to migraine headache.  Push fluids advance diet.

## 2014-08-24 NOTE — Patient Instructions (Signed)
Push fluids and advance diet as tolerated. Can use imitrex for migraine headache. Go ahead and start probiotic.  call if not continuing to improve.

## 2014-08-31 DIAGNOSIS — M25561 Pain in right knee: Secondary | ICD-10-CM | POA: Diagnosis not present

## 2014-09-02 DIAGNOSIS — M7752 Other enthesopathy of left foot: Secondary | ICD-10-CM | POA: Diagnosis not present

## 2014-09-02 DIAGNOSIS — M25572 Pain in left ankle and joints of left foot: Secondary | ICD-10-CM | POA: Diagnosis not present

## 2014-09-02 DIAGNOSIS — M792 Neuralgia and neuritis, unspecified: Secondary | ICD-10-CM | POA: Diagnosis not present

## 2014-09-03 DIAGNOSIS — M25561 Pain in right knee: Secondary | ICD-10-CM | POA: Diagnosis not present

## 2014-09-09 DIAGNOSIS — M25561 Pain in right knee: Secondary | ICD-10-CM | POA: Diagnosis not present

## 2014-09-16 DIAGNOSIS — J4540 Moderate persistent asthma, uncomplicated: Secondary | ICD-10-CM | POA: Diagnosis not present

## 2014-09-20 DIAGNOSIS — J4540 Moderate persistent asthma, uncomplicated: Secondary | ICD-10-CM | POA: Diagnosis not present

## 2014-09-21 DIAGNOSIS — Z1231 Encounter for screening mammogram for malignant neoplasm of breast: Secondary | ICD-10-CM | POA: Diagnosis not present

## 2014-09-21 DIAGNOSIS — Z803 Family history of malignant neoplasm of breast: Secondary | ICD-10-CM | POA: Diagnosis not present

## 2014-09-21 DIAGNOSIS — M25561 Pain in right knee: Secondary | ICD-10-CM | POA: Diagnosis not present

## 2014-09-22 ENCOUNTER — Encounter: Payer: Self-pay | Admitting: Family Medicine

## 2014-09-23 DIAGNOSIS — M25561 Pain in right knee: Secondary | ICD-10-CM | POA: Diagnosis not present

## 2014-09-27 ENCOUNTER — Ambulatory Visit: Payer: Federal, State, Local not specified - PPO | Admitting: Cardiovascular Disease

## 2014-09-27 DIAGNOSIS — M25561 Pain in right knee: Secondary | ICD-10-CM | POA: Diagnosis not present

## 2014-09-29 ENCOUNTER — Ambulatory Visit (INDEPENDENT_AMBULATORY_CARE_PROVIDER_SITE_OTHER): Payer: Medicare Other | Admitting: Family Medicine

## 2014-09-29 ENCOUNTER — Encounter: Payer: Self-pay | Admitting: Family Medicine

## 2014-09-29 ENCOUNTER — Telehealth: Payer: Self-pay | Admitting: Family Medicine

## 2014-09-29 VITALS — BP 120/70 | HR 76 | Temp 98.3°F | Ht 64.5 in | Wt 232.5 lb

## 2014-09-29 DIAGNOSIS — L03119 Cellulitis of unspecified part of limb: Secondary | ICD-10-CM

## 2014-09-29 DIAGNOSIS — E119 Type 2 diabetes mellitus without complications: Secondary | ICD-10-CM | POA: Diagnosis not present

## 2014-09-29 DIAGNOSIS — E1169 Type 2 diabetes mellitus with other specified complication: Secondary | ICD-10-CM

## 2014-09-29 DIAGNOSIS — E669 Obesity, unspecified: Secondary | ICD-10-CM | POA: Diagnosis not present

## 2014-09-29 DIAGNOSIS — K529 Noninfective gastroenteritis and colitis, unspecified: Secondary | ICD-10-CM

## 2014-09-29 DIAGNOSIS — E11628 Type 2 diabetes mellitus with other skin complications: Secondary | ICD-10-CM

## 2014-09-29 NOTE — Telephone Encounter (Signed)
Patient Name: Kamarii Liverman  DOB: 04-Jan-1946    Initial Comment Caller states vomiting, has an appointment to see the dr today.    Nurse Assessment  Nurse: Mallie Mussel, RN, Alveta Heimlich Date/Time Eilene Ghazi Time): 09/29/2014 9:28:33 AM  Confirm and document reason for call. If symptomatic, describe symptoms. ---Caller states that she began vomiting yesterday evening. She has vomited x 3 since then. She has eating pretzels and drinking ginger ale. She was advised to rest. She has an appointment for 3:45 this afternoon to be seen. Wants to know what she should do until then. She last urinated 1 hour ago. Denies diarrhea and fever. She states that she is a diabetic, but she does not check her blood sugar readings.  Has the patient traveled out of the country within the last 30 days? ---No  Does the patient require triage? ---Yes  Related visit to physician within the last 2 weeks? ---No  Does the PT have any chronic conditions? (i.e. diabetes, asthma, etc.) ---Yes  List chronic conditions. ---Asthma, Diabetes     Guidelines    Guideline Title Affirmed Question Affirmed Notes  Vomiting [1] MODERATE vomiting (e.g., 3 - 5 times/day) AND [2] age > 49    Final Disposition User   Go to ED Now (or PCP triage) Mallie Mussel, RN, Alveta Heimlich    Comments  Caller has an appointment for later today. I attempted to schedule an earlier appointment based on triage outcome. No appointment available. Sent to ER per directives in profile.

## 2014-09-29 NOTE — Telephone Encounter (Signed)
PLEASE NOTE: All timestamps contained within this report are represented as [place] Standard Time. CONFIDENTIALTY NOTICE: This fax transmission is intended only for the addressee. It contains information that is legally privileged, confidential or otherwise protected from use or disclosure. If you are not the intended recipient, you are strictly prohibited from reviewing, disclosing, copying using or disseminating any of this information or taking any action in reliance on or regarding this information. If you have received this fax in error, please notify us immediately by telephone so that we can arrange for its return to [place]. Phone: [phone], Toll-Free: [phone], Fax: [phone] Page: 1 of 2 Call Id: [id number] [name]: [name] Gender: Female DOB: January 25, 1946 Age: 69 Y 2 M 3 D Return Phone Number: [phone] (Primary) Address: [address] City/State/Zip: [name] [address] Client [facility] - Client Client Site [facility] - Day Physician [place], Frederico Hamman Contact Type Call Call Type Triage / Clinical Relationship To Patient Self Appointment Disposition EMR Appointment Attempted - Not Scheduled Info pasted into Epic Yes Return Phone Number [phone] (Primary) Chief Complaint Vomiting Initial Comment Caller states vomiting, has an appointment to see the dr today. PreDisposition Go to Urgent Care/Walk-In Clinic Nurse Assessment Nurse: Mallie Mussel, RN, Alveta Heimlich Date/Time Eilene Ghazi Time): 09/29/2014 9:28:33 AM Confirm and document reason for call. If symptomatic, describe symptoms. ---Caller states that she began vomiting yesterday evening. She has vomited x 3 since then. She has eating pretzels and drinking ginger ale. She was advised to rest. She has an appointment for 3:45 this afternoon to be seen. Wants to  know what she should do until then. She last urinated 1 hour ago. Denies diarrhea and fever. She states that she is a diabetic, but she does not check her blood sugar readings. Has the patient traveled out of the country within the last 30 days? ---No Does the patient require triage? ---Yes Related visit to physician within the last 2 weeks? ---No Does the PT have any chronic conditions? (i.e. diabetes, asthma, etc.) ---Yes List chronic conditions. ---Asthma, Diabetes Guidelines Guideline Title Affirmed Question Affirmed Notes Nurse Date/Time Eilene Ghazi Time) Vomiting [1] MODERATE vomiting (e.g., 3 - 5 times/day) AND [2] age > 62 Orlene Och 09/29/2014 9:31:50 AM Disp. Time Eilene Ghazi Time) Disposition Final User 09/29/2014 9:38:56 AM Go to ED Now (or PCP triage) Yes Mallie Mussel, RN, Carman Ching NOTE: All timestamps contained within this report are represented as [place] Standard Time. CONFIDENTIALTY NOTICE: This fax transmission is intended only for the addressee. It contains information that is legally privileged, confidential or otherwise protected from use or disclosure. If you are not the intended recipient, you are strictly prohibited from reviewing, disclosing, copying using or disseminating any of this information or taking any action in reliance on or regarding this information. If you have received this fax in error, please notify us immediately by telephone so that we can arrange for its return to [place]. Phone: [phone], Toll-Free: [phone], Fax: [phone] Page: 2 of 2 Call Id: [id number] Caller Understands: Yes Disagree/Comply: Comply Care Advice Given Per Guideline GO TO ED NOW (OR PCP TRIAGE): CONTAINER: You may wish to bring a bucket or container with you in case there is more vomiting during the drive. * Please bring a list of your current medicines when you go to see the doctor. After Care Instructions Given Call Event Type User Date / Time  Description Comments User: Reeves Forth, RN Date/Time Eilene Ghazi Time): 09/29/2014 9:41:26 AM Caller has an appointment for later today. I attempted to schedule an earlier appointment based on triage outcome. No appointment available. Sent to ER per directives in profile. Referrals [name] - ED

## 2014-09-29 NOTE — Progress Notes (Signed)
Dr. Frederico Hamman T. Flora Ratz, MD, [facility] [name], [address] Phone: [phone] Fax: [phone]  09/29/2014  Patient: [name], MRN: [id number], DOB: 24-Oct-1945, 69 y.o.  Primary Physician:  [name], MD  Chief Complaint: Emesis; Nausea; Nasal Congestion; and Watery Eyes  Subjective:   [name] is a 69 y.o. very pleasant female patient who presents with the following:  Very nauseated and sick. Eating pretzels and ginger ale. Stomach is growling and stomach some. A little diarrhea the other day. None since. Not eating well. Generally doesn't feel that great. Using zofran.  Lab Results  Component Value Date   HGBA1C 6.5 03/25/2014    Wants referral to Dr. Wilson Singer.   Past Medical History, Surgical History, Social History, Family History, Problem List, Medications, and Allergies have been reviewed and updated if relevant.  ROS: GEN: Acute illness details above GI: Tolerating PO intake GU: maintaining adequate hydration and urination Pulm: No SOB Interactive and getting along well at home.  Otherwise, ROS is as per the HPI.   Objective:   BP 120/70 mmHg  Pulse 76  Temp(Src) 98.3 F (36.8 C) (Oral)  Ht 5' 4.5" (1.638 m)  Wt 232 lb 8 oz (105.461 kg)  BMI 39.31 kg/m2  GEN: WDWN, NAD, Non-toxic, A & O x 3 HEENT: Atraumatic, Normocephalic. Neck supple. No masses, No LAD. Ears and Nose: No external deformity. CV: RRR, No M/G/R. No JVD. No thrill. No extra heart sounds. PULM: CTA B, no wheezes, crackles, rhonchi. No retractions. No resp. distress. No accessory muscle use. ABD: S, NT, ND, +BS. No rebound. No HSM. EXTR: No c/c/e NEURO Normal gait.  PSYCH: Normally interactive. Conversant. Not depressed or anxious appearing.  Calm demeanor.     Laboratory and Imaging Data:  Assessment and Plan:   Gastroenteritis, acute  Type 2 diabetes, controlled, with cellulitis of foot - Plan: Ambulatory  referral to Endocrinology  Diabetes mellitus type 2 in obese  >25 minutes spent in face to face time with patient, >50% spent in counselling or coordination of care  Looks well hydrated. Push fluid, BRAT diet.  Patient had a number of questions regarding DM, its care, what she should be doing to care for it, and she requested an Endo referral.  Follow-up: No Follow-up on file.  New Prescriptions   No medications on file   Orders Placed This Encounter  Procedures  . Ambulatory referral to Endocrinology    Signed,  Maud Deed. Labrisha Wuellner, MD   Patient's Medications  New Prescriptions   No medications on file  Previous Medications   ACETAMINOPHEN (TYLENOL) 325 MG TABLET    Take 650 mg by mouth every 6 (six) hours as needed for moderate pain (pain). pain   ALBUTEROL (PROAIR HFA) 108 (90 BASE) MCG/ACT INHALER    Inhale 2 puffs into the lungs every 6 (six) hours as needed for wheezing (wheezing). Shortness of breath   ASPIRIN 81 MG TABLET    Take 81 mg by mouth 2 (two) times daily.    AZELASTINE-FLUTICASONE (DYMISTA) 137-50 MCG/ACT SUSP    Place 1 puff into the nose every other day.    BECLOMETHASONE (QVAR) 80 MCG/ACT INHALER    Inhale 2 puffs into the lungs 2 (two) times daily.     BIOTIN 1000 MCG TABLET    Take 3,000 mcg by mouth daily.    CHOLECALCIFEROL (VITAMIN D3) 2000 UNITS CAPSULE    Take 2,000 Units by  mouth daily.   COENZYME Q10 200 MG CAPSULE    Take 200 mg by mouth daily.   CYCLOBENZAPRINE (FLEXERIL) 10 MG TABLET    Take 10 mg by mouth daily as needed for muscle spasms.    DIPHENHYDRAMINE HCL (BENADRYL PO)    Take 1 tablet by mouth daily as needed (itching).    DULOXETINE (CYMBALTA) 30 MG CAPSULE    Take 30 mg by mouth daily.   FLUTICASONE-SALMETEROL (ADVAIR HFA) 230-21 MCG/ACT INHALER    Inhale 2 puffs into the lungs 2 (two) times daily.   FUROSEMIDE (LASIX) 40 MG TABLET    Take 40 mg by mouth daily.    GINKGO BILOBA 40 MG TABS    Take 1 tablet by mouth 3 (three) times  daily.   LACTASE (DAIRY-RELIEF PO)    Take 1 capsule by mouth daily as needed.    MAGNESIUM 250 MG TABS    Take 1 tablet by mouth daily.   MELOXICAM (MOBIC) 15 MG TABLET    Take 15 mg by mouth daily.   METFORMIN (GLUCOPHAGE) 500 MG TABLET    Take 1 tablet (500 mg total) by mouth 2 (two) times daily with a meal.   MONTELUKAST (SINGULAIR) 10 MG TABLET    Take 10 mg by mouth daily.     OLOPATADINE HCL (PATADAY) 0.2 % SOLN    Apply 1 drop to eye daily as needed (dry eyes).    OMEGA 3-6-9 FATTY ACIDS (OMEGA 3-6-9 COMPLEX PO)    Take 2 capsules by mouth daily.   ONDANSETRON (ZOFRAN ODT) 4 MG DISINTEGRATING TABLET    Take 1 tablet (4 mg total) by mouth every 8 (eight) hours as needed for nausea or vomiting.   ONDANSETRON (ZOFRAN) 4 MG TABLET    Take 4 mg by mouth every 8 (eight) hours as needed for nausea or vomiting (nausea).   PANTOPRAZOLE (PROTONIX) 40 MG TABLET    Take 40 mg by mouth daily.   POTASSIUM CHLORIDE CR (MICRO-K) 8 MEQ CPCR CAPSULE CR    Take 8 mEq by mouth 2 (two) times daily.   PROBIOTIC PRODUCT (ALIGN) 4 MG CAPS    Take 1 capsule by mouth daily.   RANITIDINE (ZANTAC) 300 MG CAPSULE    Take 300 mg by mouth every evening.   SODIUM CHLORIDE (OCEAN) 0.65 % SOLN NASAL SPRAY    Place 2 sprays into both nostrils daily as needed for congestion (congestion).    SUMATRIPTAN (IMITREX) 100 MG TABLET    Take 100 mg x 1 may, repeat in 2 hour for migraine.   TRAMADOL (ULTRAM) 50 MG TABLET    Take 1 tablet (50 mg total) by mouth every 6 (six) hours as needed.   VALSARTAN (DIOVAN) 80 MG TABLET    TAKE 1 TABLET EVERY DAY   VITAMIN E 400 UNIT CAPSULE    Take 800 Units by mouth daily.  Modified Medications   No medications on file  Discontinued Medications   No medications on file

## 2014-09-29 NOTE — Telephone Encounter (Signed)
Pt still has appt scheduled with Dr Lorelei Pont 09/29/14 at 3:45 pm.

## 2014-09-29 NOTE — Progress Notes (Signed)
Pre visit review using our clinic review tool, if applicable. No additional management support is needed unless otherwise documented below in the visit note. 

## 2014-10-05 DIAGNOSIS — M25561 Pain in right knee: Secondary | ICD-10-CM | POA: Diagnosis not present

## 2014-10-06 DIAGNOSIS — M216X1 Other acquired deformities of right foot: Secondary | ICD-10-CM | POA: Diagnosis not present

## 2014-10-06 DIAGNOSIS — M216X2 Other acquired deformities of left foot: Secondary | ICD-10-CM | POA: Diagnosis not present

## 2014-10-06 DIAGNOSIS — D2372 Other benign neoplasm of skin of left lower limb, including hip: Secondary | ICD-10-CM | POA: Diagnosis not present

## 2014-10-06 DIAGNOSIS — M25571 Pain in right ankle and joints of right foot: Secondary | ICD-10-CM | POA: Diagnosis not present

## 2014-10-06 DIAGNOSIS — M71571 Other bursitis, not elsewhere classified, right ankle and foot: Secondary | ICD-10-CM | POA: Diagnosis not present

## 2014-10-07 DIAGNOSIS — M25561 Pain in right knee: Secondary | ICD-10-CM | POA: Diagnosis not present

## 2014-10-11 DIAGNOSIS — G629 Polyneuropathy, unspecified: Secondary | ICD-10-CM | POA: Diagnosis not present

## 2014-10-11 DIAGNOSIS — M1990 Unspecified osteoarthritis, unspecified site: Secondary | ICD-10-CM | POA: Diagnosis not present

## 2014-10-11 DIAGNOSIS — E118 Type 2 diabetes mellitus with unspecified complications: Secondary | ICD-10-CM | POA: Diagnosis not present

## 2014-10-13 DIAGNOSIS — M25561 Pain in right knee: Secondary | ICD-10-CM | POA: Diagnosis not present

## 2014-10-14 DIAGNOSIS — M5412 Radiculopathy, cervical region: Secondary | ICD-10-CM | POA: Diagnosis not present

## 2014-10-14 DIAGNOSIS — G40209 Localization-related (focal) (partial) symptomatic epilepsy and epileptic syndromes with complex partial seizures, not intractable, without status epilepticus: Secondary | ICD-10-CM | POA: Diagnosis not present

## 2014-10-14 DIAGNOSIS — M5417 Radiculopathy, lumbosacral region: Secondary | ICD-10-CM | POA: Diagnosis not present

## 2014-10-14 DIAGNOSIS — R202 Paresthesia of skin: Secondary | ICD-10-CM | POA: Diagnosis not present

## 2014-10-14 DIAGNOSIS — G5601 Carpal tunnel syndrome, right upper limb: Secondary | ICD-10-CM | POA: Diagnosis not present

## 2014-10-14 DIAGNOSIS — E559 Vitamin D deficiency, unspecified: Secondary | ICD-10-CM | POA: Diagnosis not present

## 2014-10-14 DIAGNOSIS — G603 Idiopathic progressive neuropathy: Secondary | ICD-10-CM | POA: Diagnosis not present

## 2014-10-14 DIAGNOSIS — G609 Hereditary and idiopathic neuropathy, unspecified: Secondary | ICD-10-CM | POA: Diagnosis not present

## 2014-10-14 DIAGNOSIS — G5602 Carpal tunnel syndrome, left upper limb: Secondary | ICD-10-CM | POA: Diagnosis not present

## 2014-10-15 DIAGNOSIS — J4540 Moderate persistent asthma, uncomplicated: Secondary | ICD-10-CM | POA: Diagnosis not present

## 2014-10-15 DIAGNOSIS — E119 Type 2 diabetes mellitus without complications: Secondary | ICD-10-CM | POA: Diagnosis not present

## 2014-10-15 DIAGNOSIS — Z79899 Other long term (current) drug therapy: Secondary | ICD-10-CM | POA: Diagnosis not present

## 2014-10-15 DIAGNOSIS — M216X2 Other acquired deformities of left foot: Secondary | ICD-10-CM | POA: Diagnosis not present

## 2014-10-18 ENCOUNTER — Telehealth: Payer: Self-pay | Admitting: Family Medicine

## 2014-10-18 DIAGNOSIS — J4540 Moderate persistent asthma, uncomplicated: Secondary | ICD-10-CM | POA: Diagnosis not present

## 2014-10-18 DIAGNOSIS — E669 Obesity, unspecified: Principal | ICD-10-CM

## 2014-10-18 DIAGNOSIS — E1169 Type 2 diabetes mellitus with other specified complication: Secondary | ICD-10-CM

## 2014-10-18 NOTE — Telephone Encounter (Signed)
-----   Message from Ellamae Sia sent at 10/13/2014  6:40 PM EDT ----- Regarding: Lab orders for Tuesday, 4.5.16 Lab orders for a 6 month f/u

## 2014-10-19 ENCOUNTER — Other Ambulatory Visit (INDEPENDENT_AMBULATORY_CARE_PROVIDER_SITE_OTHER): Payer: Medicare Other

## 2014-10-19 DIAGNOSIS — E669 Obesity, unspecified: Secondary | ICD-10-CM

## 2014-10-19 DIAGNOSIS — E119 Type 2 diabetes mellitus without complications: Secondary | ICD-10-CM

## 2014-10-19 DIAGNOSIS — E1169 Type 2 diabetes mellitus with other specified complication: Secondary | ICD-10-CM

## 2014-10-19 LAB — COMPREHENSIVE METABOLIC PANEL
ALT: 12 U/L (ref 0–35)
AST: 15 U/L (ref 0–37)
Albumin: 3.9 g/dL (ref 3.5–5.2)
Alkaline Phosphatase: 70 U/L (ref 39–117)
BUN: 11 mg/dL (ref 6–23)
CO2: 30 mEq/L (ref 19–32)
Calcium: 9.6 mg/dL (ref 8.4–10.5)
Chloride: 104 mEq/L (ref 96–112)
Creatinine, Ser: 0.94 mg/dL (ref 0.40–1.20)
GFR: 76.11 mL/min (ref >60.00)
Glucose, Bld: 146 mg/dL — ABNORMAL HIGH (ref 70–99)
Potassium: 4.6 mEq/L (ref 3.5–5.1)
Sodium: 139 mEq/L (ref 135–145)
Total Bilirubin: 0.6 mg/dL (ref 0.2–1.2)
Total Protein: 6.8 g/dL (ref 6.0–8.3)

## 2014-10-19 LAB — LIPID PANEL
Cholesterol: 151 mg/dL (ref 0–200)
HDL: 60.9 mg/dL (ref >39.00)
LDL Cholesterol: 76 mg/dL (ref 0–99)
NonHDL: 90.1
Total CHOL/HDL Ratio: 2
Triglycerides: 70 mg/dL (ref 0.0–149.0)
VLDL: 14 mg/dL (ref 0.0–40.0)

## 2014-10-19 LAB — HEMOGLOBIN A1C: Hgb A1c MFr Bld: 6.2 % (ref 4.6–6.5)

## 2014-10-21 DIAGNOSIS — M25561 Pain in right knee: Secondary | ICD-10-CM | POA: Diagnosis not present

## 2014-10-26 ENCOUNTER — Encounter: Payer: Self-pay | Admitting: Family Medicine

## 2014-10-26 ENCOUNTER — Telehealth: Payer: Self-pay | Admitting: Family Medicine

## 2014-10-26 ENCOUNTER — Ambulatory Visit (INDEPENDENT_AMBULATORY_CARE_PROVIDER_SITE_OTHER): Payer: Medicare Other | Admitting: Family Medicine

## 2014-10-26 VITALS — BP 160/70 | HR 85 | Temp 98.6°F | Ht 64.5 in | Wt 240.5 lb

## 2014-10-26 DIAGNOSIS — E669 Obesity, unspecified: Secondary | ICD-10-CM | POA: Diagnosis not present

## 2014-10-26 DIAGNOSIS — J4540 Moderate persistent asthma, uncomplicated: Secondary | ICD-10-CM

## 2014-10-26 DIAGNOSIS — E1169 Type 2 diabetes mellitus with other specified complication: Secondary | ICD-10-CM

## 2014-10-26 DIAGNOSIS — E119 Type 2 diabetes mellitus without complications: Secondary | ICD-10-CM | POA: Diagnosis not present

## 2014-10-26 DIAGNOSIS — I10 Essential (primary) hypertension: Secondary | ICD-10-CM | POA: Diagnosis not present

## 2014-10-26 LAB — HM DIABETES FOOT EXAM

## 2014-10-26 NOTE — Telephone Encounter (Signed)
[name] notified as instructed.  She states she has not had any fever or numbness in her groin.  She will stop by office in the next couple of days to collect a CC Urine Sample.

## 2014-10-26 NOTE — Telephone Encounter (Signed)
Have her come in to get a UA to assure no infection. If not UTI  and urgency (not incontinence only happening with sneeze/cough) then we can try a trial of a medication. Also verify no fever and no groin numbness.

## 2014-10-26 NOTE — Assessment & Plan Note (Signed)
Well controlled with taking medication. Encouraged exercise, weight loss, healthy eating habits.

## 2014-10-26 NOTE — Patient Instructions (Addendum)
Make sure to take BP medication daily. Working on weight loss and exercise as tolerated. Continue working on low carb diet.

## 2014-10-26 NOTE — Telephone Encounter (Signed)
Pt forgot to ask you a question when she was at her visit this afternoon.  Please call back at [phone]. Thanks.

## 2014-10-26 NOTE — Assessment & Plan Note (Signed)
Well controlled on metformin. Now seeing ENDO.

## 2014-10-26 NOTE — Telephone Encounter (Signed)
Ms. Honea states she forgot to mention that she is having urinary incontinence/urgency.  She has bought herself some depends and started wearing them because as soon as she drinks something, she has to go to the bathroom.  Wants to know if there is anything she can do in regards to this issue.  Please advise.

## 2014-10-26 NOTE — Assessment & Plan Note (Signed)
Some increase in symtpoms. Follwoed by Dr. Neldon Mc.

## 2014-10-26 NOTE — Progress Notes (Signed)
69 year old female with HTN and DM presents for 6 month follow up,  She is having upcoming surgery on left foot.  Hypertension: Previously controlled  on diovan, lasix prn. She has not taken any medicaiton today. BP Readings from Last 3 Encounters:  10/26/14 160/70  09/29/14 120/70  08/24/14 120/80  Using medication without problems or lightheadedness: None Chest pain with exertion:None Edema:None Short of breath: Worse lately with pollen, has asthma. Average home BPs: not checking Other issues:  Asthma moderate control: Avoiding pollen. Using albuterol inhaler every few days when she goes out. On controller med daily Using nasal steroid prn given bleeding. Followed allergist, Dr. Neldon Mc.  Diabetes: Well controlled on metformin. Lab Results  Component Value Date   HGBA1C 6.2 10/19/2014  Using medications without difficulties:None Hypoglycemic episodes:? Hyperglycemic episodes:? Feet problems:none Blood Sugars averaging:not checking, plans on checking now. eye exam within last year:yes Seeing Dr. Wilson Singer now ENDO.  Wt Readings from Last 3 Encounters:  10/26/14 240 lb 8 oz (109.09 kg)  09/29/14 232 lb 8 oz (105.461 kg)  08/24/14 234 lb (106.142 kg)    LDL at goal < 100 on no medication. Lab Results  Component Value Date   CHOL 151 10/19/2014   HDL 60.90 10/19/2014   LDLCALC 76 10/19/2014   TRIG 70.0 10/19/2014   CHOLHDL 2 10/19/2014  Diet compliance: Good control. Exercise: Bicycle indoor, looking into [employer]. Other complaints:   Review of Systems Review of Systems  Constitutional: Positive for weight loss and malaise/fatigue. Negative for fever.  HENT: Negative for ear pain.   Eyes: Negative for pain.  Respiratory: Positive for shortness of breath and wheezing.   Cardiovascular: Negative for chest pain.  Gastrointestinal: Negative for nausea.  Genitourinary: Negative for dysuria.     Objective:   Physical Exam  Constitutional: Vital signs are normal. She  appears well-developed and well-nourished. She is cooperative. Non-toxic appearance. She does not appear ill. No distress.  Overweight female in NAD  HENT:  Head: Normocephalic.  Right Ear: Hearing, tympanic membrane, external ear and ear canal normal. Tympanic membrane is not erythematous, not retracted and not bulging.  Left Ear: Hearing, tympanic membrane, external ear and ear canal normal. Tympanic membrane is not erythematous, not retracted and not bulging.  Nose: Nose normal. No mucosal edema or rhinorrhea. Right sinus exhibits no maxillary sinus tenderness and no frontal sinus tenderness. Left sinus exhibits no maxillary sinus tenderness and no frontal sinus tenderness.  Mouth/Throat: Uvula is midline, oropharynx is clear and moist and mucous membranes are normal.  Eyes: Conjunctivae, EOM and lids are normal. Pupils are equal, round, and reactive to light. Lids are everted and swept, no foreign bodies found.  Neck: Trachea normal and normal range of motion. Neck supple. Carotid bruit is not present. No mass and no thyromegaly present.  Cardiovascular: Normal rate, regular rhythm, S1 normal, S2 normal, normal heart sounds, intact distal pulses and normal pulses. Exam reveals no gallop and no friction rub.  No murmur heard. Pulmonary/Chest: Effort normal and breath sounds normal. Not tachypneic. No respiratory distress. She has no decreased breath sounds. She has no wheezes. She has no rhonchi. She has no rales.  Abdominal: Soft. Normal appearance and bowel sounds are normal. She exhibits no distension, no fluid wave, no abdominal bruit and no mass. There is no hepatosplenomegaly. There is no tenderness. There is no rebound, no guarding and no CVA tenderness. No hernia.  Lymphadenopathy:   She has no cervical adenopathy.   She has no  axillary adenopathy.  Neurological: She is alert. She has normal strength. No cranial nerve deficit or sensory deficit.  Skin: Skin is warm, dry and  intact. No rash noted.  Psychiatric: Her speech is normal and behavior is normal. Judgment and thought content normal. Her mood appears not anxious. Cognition and memory are normal. She does not exhibit a depressed mood.   Diabetic foot exam: Normal inspection No skin breakdown No calluses  Normal DP pulses Normal sensation to light touch and monofilament Nails normal

## 2014-10-26 NOTE — Progress Notes (Signed)
Pre visit review using our clinic review tool, if applicable. No additional management support is needed unless otherwise documented below in the visit note. 

## 2014-10-27 ENCOUNTER — Other Ambulatory Visit (INDEPENDENT_AMBULATORY_CARE_PROVIDER_SITE_OTHER): Payer: Medicare Other | Admitting: Family Medicine

## 2014-10-27 DIAGNOSIS — R3915 Urgency of urination: Secondary | ICD-10-CM | POA: Diagnosis not present

## 2014-10-27 LAB — POCT URINALYSIS DIPSTICK
Bilirubin, UA: NEGATIVE
Glucose, UA: NEGATIVE
Ketones, UA: NEGATIVE
Leukocytes, UA: NEGATIVE
Nitrite, UA: NEGATIVE
Protein, UA: NEGATIVE
Spec Grav, UA: >=1.03
Urobilinogen, UA: 0.2
pH, UA: 6

## 2014-10-28 LAB — URINE CULTURE
Colony Count: NO GROWTH
Organism ID, Bacteria: NO GROWTH

## 2014-10-29 ENCOUNTER — Telehealth: Payer: Self-pay | Admitting: Family Medicine

## 2014-10-29 MED ORDER — TOLTERODINE TARTRATE ER 4 MG PO CP24: 4.0000 mg | ORAL_CAPSULE | Freq: Every day | ORAL | Status: DC

## 2014-10-29 NOTE — Telephone Encounter (Signed)
-----   Message from Carter Kitten, [place] sent at 10/29/2014  9:09 AM EDT ----- Ms. Dixon notified as instructed by telephone.  She would like to try the Detrol LA for urge incontinence.

## 2014-11-02 DIAGNOSIS — M25572 Pain in left ankle and joints of left foot: Secondary | ICD-10-CM | POA: Diagnosis not present

## 2014-11-02 DIAGNOSIS — I10 Essential (primary) hypertension: Secondary | ICD-10-CM | POA: Diagnosis not present

## 2014-11-02 DIAGNOSIS — M2042 Other hammer toe(s) (acquired), left foot: Secondary | ICD-10-CM | POA: Diagnosis not present

## 2014-11-05 DIAGNOSIS — M25572 Pain in left ankle and joints of left foot: Secondary | ICD-10-CM | POA: Diagnosis not present

## 2014-11-08 ENCOUNTER — Telehealth: Payer: Self-pay | Admitting: Family Medicine

## 2014-11-08 NOTE — Telephone Encounter (Signed)
DOB: [birth year] Initial Comment caller states that she fell the other day after foot surgery, bruise is still there (across thigh), still very sore Nurse Assessment Nurse: Genoveva Ill, RN, Lattie Haw Date/Time (Eastern Time): 11/08/2014 3:54:13 PM Confirm and document reason for call. If symptomatic, describe symptoms. ---caller states that she fell last Wednesday after left foot surgery and hit left thigh on commode when she fell; outer left thigh is bruised and still painful, rated about 5/10 Has the patient traveled out of the country within the last 30 days? ---No Does the patient require triage? ---Yes Related visit to physician within the last 2 weeks? ---No Does the PT have any chronic conditions? (i.e. diabetes, asthma, etc.) ---Yes List chronic conditions. ---NIIDM, asthma Guidelines Guideline Title Affirmed Question Affirmed Notes Leg Injury [1] Large swelling or bruise AND [2] size > palm of person's hand Final Disposition User See Physician within 24 Hours [place], Therapist, sports, [employer]

## 2014-11-08 NOTE — Telephone Encounter (Signed)
PLEASE NOTE: All timestamps contained within this report are represented as [place] Standard Time. CONFIDENTIALTY NOTICE: This fax transmission is intended only for the addressee. It contains information that is legally privileged, confidential or otherwise protected from use or disclosure. If you are not the intended recipient, you are strictly prohibited from reviewing, disclosing, copying using or disseminating any of this information or taking any action in reliance on or regarding this information. If you have received this fax in error, please notify us immediately by telephone so that we can arrange for its return to [place]. Phone: [phone], Toll-Free: [phone], Fax: [phone] Page: 1 of 2 Call Id: [id number] [place] Patient Name: [name] Gender: Female DOB: [birth year] Age: 69 Y 3 M 12 D Return Phone Number: [phone] (Primary) Address: [address] City/State/Zip: [name] [address] Client  [facility] - Client Client Site [facility] - Day Physician Diona Browner, [place] Contact Type Call Call Type Triage / Clinical Relationship To Patient Self Appointment Disposition EMR Appointment Scheduled Info pasted into Epic Yes Return Phone Number [phone] (Primary) Chief Complaint Leg Injury Initial Comment caller states that she fell the other day after foot surgery, bruise is still there (across thigh), still very sore PreDisposition Did not know what to do Nurse Assessment Nurse: Genoveva Ill, RN, Lattie Haw Date/Time (Eastern Time): 11/08/2014 3:54:13 PM Confirm and document reason for call. If symptomatic, describe symptoms. ---caller states that she fell last Wednesday after left foot surgery and hit left thigh on commode when she fell; outer left thigh is bruised and still painful, rated about 5/10 Has the patient traveled  out of the country within the last 30 days? ---No Does the patient require triage? ---Yes Related visit to physician within the last 2 weeks? ---No Does the PT have any chronic conditions? (i.e. diabetes, asthma, etc.) ---Yes List chronic conditions. ---NIIDM, asthma Guidelines Guideline Title Affirmed Question Affirmed Notes Nurse Date/Time Eilene Ghazi Time) Leg Injury [1] Large swelling or bruise AND [2] size > palm of person's hand Burress, RN, Lattie Haw 11/08/2014 3:58:27 PM Disp. Time Eilene Ghazi Time) Disposition Final User 11/08/2014 4:01:54 PM See Physician within 24 Hours Yes Burress, RN, Leland Johns Understands: Yes Disagree/Comply: Comply PLEASE NOTE: All timestamps contained within this report are represented as [place] Standard Time. CONFIDENTIALTY NOTICE: This fax transmission is intended only for the addressee. It contains information that is legally privileged, confidential or otherwise protected from use or disclosure. If you are not the intended recipient, you are strictly prohibited from reviewing, disclosing, copying using or disseminating any of this information or taking any action in reliance on or regarding this information. If you have received this fax in error, please notify us immediately by telephone so that we can arrange for its return to [place]. Phone: [phone], Toll-Free: [phone], Fax: [phone] Page: 2 of 2 Call Id: [id number] Care Advice Given Per Guideline SEE PHYSICIAN WITHIN 24 HOURS: * IF OFFICE WILL BE OPEN: You need to be examined within the next 24 hours. Call your doctor when the office opens, and make an appointment. LOCAL COLD: For bruises or swelling, apply a cold pack or an ice bag (wrapped in a moist towel) to the area for 20 minutes per hour. Repeat for 4 consecutive hours. (Reason: reduce the bleeding and pain) * For pain relief, take acetaminophen, ibuprofen, or naproxen. CALL BACK IF: * Pain becomes severe * You become worse. CARE ADVICE  given per Leg Injury (Adult) guideline. After Care Instructions Given Call Event Type User Date / Time Description Referrals REFERRED TO PCP OFFICE

## 2014-11-08 NOTE — Telephone Encounter (Signed)
Pt has appt 11/09/14 at 9 AM to see Dr Diona Browner.

## 2014-11-09 ENCOUNTER — Ambulatory Visit (INDEPENDENT_AMBULATORY_CARE_PROVIDER_SITE_OTHER): Payer: Medicare Other | Admitting: Family Medicine

## 2014-11-09 ENCOUNTER — Encounter: Payer: Self-pay | Admitting: Family Medicine

## 2014-11-09 VITALS — BP 130/78 | HR 80 | Temp 97.9°F | Ht 64.5 in | Wt 239.5 lb

## 2014-11-09 DIAGNOSIS — J4540 Moderate persistent asthma, uncomplicated: Secondary | ICD-10-CM | POA: Diagnosis not present

## 2014-11-09 DIAGNOSIS — T148 Other injury of unspecified body region: Secondary | ICD-10-CM

## 2014-11-09 DIAGNOSIS — W19XXXA Unspecified fall, initial encounter: Secondary | ICD-10-CM | POA: Diagnosis not present

## 2014-11-09 DIAGNOSIS — T148XXA Other injury of unspecified body region, initial encounter: Secondary | ICD-10-CM | POA: Insufficient documentation

## 2014-11-09 NOTE — Progress Notes (Signed)
Pre visit review using our clinic review tool, if applicable. No additional management support is needed unless otherwise documented below in the visit note. 

## 2014-11-09 NOTE — Progress Notes (Signed)
Subjective:     Patient ID: [name], female   DOB: 19-Jul-1945, 69 y.o.   MRN: [id number]  HPI [name] is a 69 y/o F with PMH of HTN, DM, GERD presenting after a fall last Wednesday. She fell at home while trying to go to the bathroom, felt like she lost her balance. She had surgery on her left foot on Tuesday and felt that her boot wasn't fitting properly. She denies any dizziness or lightheadedness leading up to the fall. She felt a little dizzy after the fall, but denies loss of consciousness. Denies hitting her head, did hit the bottom of her chin when she fell. She is complaining of a large bruise on her left leg, which was black and blue but has started to return to her normal  Skin color. The pain has decreased some since the fall, but the bruise is still bothering her and she can't lie on her left side because of the pain.   Started taking pain medicine for her foot from her surgery on Thursday, which has helped with pain from the bruise. Has been using some ice as well.   Review of Systems  Constitutional: Negative for fever and fatigue.  Respiratory: Negative for shortness of breath.   Cardiovascular: Negative for chest pain.  Neurological: Negative for dizziness, syncope and headaches.       Objective:   Physical Exam  Constitutional: She appears well-developed and well-nourished.  HENT:  Head: Normocephalic and atraumatic.  Cardiovascular: Normal rate and regular rhythm.   Murmur heard.  Crescendo decrescendo systolic murmur is present with a grade of 3/6  Murmur heard best at LUSB  Pulmonary/Chest: Effort normal and breath sounds normal.  Neurological: She has normal strength. No sensory deficit.  Skin: Skin is warm and dry.  Large 16 cm healing dark red bruise on lateral left thigh with area of central clearing, no induration present Smaller healing bruises present underneath chin, on left anterior shin, right medial ankle  Psychiatric: She has a normal mood and  affect. Her behavior is normal.       Assessment:     1. Fall: Appears to be mechanical and due to loss of balance. Pt denies syncope or dizziness leading up to fall.  2. Bruise: Appears to be healing well. Sensation and strength intact in lower extremities. No induration or other evidence of hematoma or blood clot.     Plan:     1. Fall: No further work up needed.  2. Bruise: Can continue to apply ice, try to elevate the leg. Advised pt it may take some time for discoloration to resolve completely.    Dante Gang served as Education administrator for Dr. Diona Browner 11/09/14 9:00 am     Patient seen and examined. Med student acted as Education administrator only.  HPI/ROS/PE and assessment/plan created  By MD and scribed by med student.  [name] MD

## 2014-11-09 NOTE — Patient Instructions (Signed)
Continue to use ice for pain relief and elevation Let us know if you have any further questions

## 2014-11-09 NOTE — Telephone Encounter (Signed)
Patient has appt with Dr. Diona Browner.

## 2014-11-10 DIAGNOSIS — J4540 Moderate persistent asthma, uncomplicated: Secondary | ICD-10-CM | POA: Diagnosis not present

## 2014-11-11 ENCOUNTER — Ambulatory Visit (INDEPENDENT_AMBULATORY_CARE_PROVIDER_SITE_OTHER): Payer: Medicare Other | Admitting: Cardiovascular Disease

## 2014-11-11 ENCOUNTER — Encounter: Payer: Self-pay | Admitting: Cardiovascular Disease

## 2014-11-11 VITALS — BP 184/98 | HR 82 | Ht 65.0 in | Wt 230.0 lb

## 2014-11-11 DIAGNOSIS — I272 Pulmonary hypertension, unspecified: Secondary | ICD-10-CM

## 2014-11-11 DIAGNOSIS — Z86711 Personal history of pulmonary embolism: Secondary | ICD-10-CM

## 2014-11-11 DIAGNOSIS — I82401 Acute embolism and thrombosis of unspecified deep veins of right lower extremity: Secondary | ICD-10-CM | POA: Diagnosis not present

## 2014-11-11 DIAGNOSIS — I10 Essential (primary) hypertension: Secondary | ICD-10-CM

## 2014-11-11 DIAGNOSIS — I270 Primary pulmonary hypertension: Secondary | ICD-10-CM

## 2014-11-11 NOTE — Progress Notes (Signed)
Patient ID: [name], female   DOB: [birth year], 69 y.o.   MRN: [id number]     Cardiology Office Note   Date:  11/11/2014   ID:  [name], Bartek 1946-06-03, MRN [id number]  PCP:  [name], MD  Cardiologist:   Sanda Klein, MD   No chief complaint on file.     History of Present Illness: [name] is a 68 y.o. female who presents for follow-up of pulmonary artery hypertension and previous venous thromboembolic disease.  Her most recent echocardiogram performed in 2014 showed an estimated systolic PA pressure 48 mmHg. She does not have a history of chronic lung disease, congestive heart failure, obstructive sleep apnea or collagen vascular disease. The presumed cause of her pulmonary hypertension is previous venous thrombolic embolic events. She had pulmonary embolism in 2004 following 2 consecutive foot surgeries.  This spring she has had another foot surgery and her left foot is in a surgical boot. She is walking with a cane. She has noticed decreased said stamina since her last surgery but not really breathlessness. On the other hand she reports needing to use her inhaler and nebulizer more often than usual and attributes this to seasonal allergies.  On arrival to the office her blood pressure was markedly elevated at 184/98. After just a few minutes at a dropped to 160/90 mmHg. She reports that yesterday in her allergy doctor's office her blood pressure was 130/78.  [name] has a history of DVT and PE following orthopedic surgery in 2004 and was treated with coumadin for one year. At that time, her hypercoagulable w/u was negative except for a positive lupus anticoagulant, which was no longer seen on test from 10/2003. Echo in 2008 showed moderate pulmonary hypertension, with similar estimated PA pressure by f/u echo in 2011 and 2014. In 2012, right heart cath showed RA 16,RV 54/15,PA 50/20, PCWP 16-20, CO 4.8, mean PA 30, and SVR calculated to 2.5 wood units. During  that hospitalization off coumadin her ESR was normal, protein C normall, protein S level normal but activity decreased?, neg anticardiolipin Ab, and lupus anticoagulant was not detected. She has had a CT chest which showed no parenchymal lung disease or other abnormality. Reportedly she had a normal sleep study in the past  Past Medical History  Diagnosis Date  . Hypertension   . DVT (deep venous thrombosis)   . Pulmonary embolism   . Diabetes mellitus   . Seizures   . Allergic rhinitis   . Chronic headache   . Pulmonary hypertension   . Dyspnea   . Asthma   . Arthritis   . Allergy   . Heart murmur     Past Surgical History  Procedure Laterality Date  . Knee surgery    . Plantar fascia surgery    . Partial hysterectomy    . Tonsillectomy    . Cardiac catheterization  12/28/2010    Mod. pulmonary hypertension, normal coronary arteries  . Doppler echocardiography  10/08/2011    EF=>55%,mild asymmetric LVH, mod. TR, mod. PH, mild to mod LA dilatation  . Nuclear stress test  05/20/2006    No ischemia  . Abdominal hysterectomy      partial, has ovaries  . Knee arthroscopy Left      Current Outpatient Prescriptions  Medication Sig Dispense Refill  . acetaminophen (TYLENOL) 325 MG tablet Take 650 mg by mouth every 6 (six) hours as needed for moderate pain (pain). pain    . albuterol (PROAIR HFA)  108 (90 BASE) MCG/ACT inhaler Inhale 2 puffs into the lungs every 6 (six) hours as needed for wheezing (wheezing). Shortness of breath    . aspirin 81 MG tablet Take 81 mg by mouth 2 (two) times daily.     . Azelastine-Fluticasone (DYMISTA) 137-50 MCG/ACT SUSP Place 1 puff into the nose every other day.     . beclomethasone (QVAR) 80 MCG/ACT inhaler Inhale 2 puffs into the lungs 2 (two) times daily.      . Biotin 1000 MCG tablet Take 3,000 mcg by mouth daily.     . Cholecalciferol (VITAMIN D3) 2000 UNITS capsule Take 2,000 Units by mouth daily.    . Coenzyme Q10 200 MG capsule Take 200 mg  by mouth daily.    . cyclobenzaprine (FLEXERIL) 10 MG tablet Take 10 mg by mouth daily as needed for muscle spasms.     . DiphenhydrAMINE HCl (BENADRYL PO) Take 1 tablet by mouth daily as needed (itching).     . DULoxetine (CYMBALTA) 30 MG capsule Take 30 mg by mouth daily.    . fluticasone-salmeterol (ADVAIR HFA) 230-21 MCG/ACT inhaler Inhale 2 puffs into the lungs 2 (two) times daily.    . furosemide (LASIX) 40 MG tablet Take 40 mg by mouth daily.     . Ginkgo Biloba 40 MG TABS Take 1 tablet by mouth 3 (three) times daily.    . Lactase (DAIRY-RELIEF PO) Take 1 capsule by mouth daily as needed.     . Magnesium 250 MG TABS Take 1 tablet by mouth daily.    . meloxicam (MOBIC) 15 MG tablet Take 15 mg by mouth daily.    . metFORMIN (GLUCOPHAGE) 500 MG tablet Take 1 tablet (500 mg total) by mouth 2 (two) times daily with a meal. 60 tablet 11  . montelukast (SINGULAIR) 10 MG tablet Take 10 mg by mouth daily.      . Olopatadine HCl (PATADAY) 0.2 % SOLN Apply 1 drop to eye daily as needed (dry eyes).     . Omega 3-6-9 Fatty Acids (OMEGA 3-6-9 COMPLEX PO) Take 2 capsules by mouth daily.    . ondansetron (ZOFRAN ODT) 4 MG disintegrating tablet Take 1 tablet (4 mg total) by mouth every 8 (eight) hours as needed for nausea or vomiting. 10 tablet 0  . ondansetron (ZOFRAN) 4 MG tablet Take 4 mg by mouth every 8 (eight) hours as needed for nausea or vomiting (nausea).    [name] DELICA LANCETS 94B MISC as needed.     [name] VERIO test strip 1 each by Other route as needed.     Marland Kitchen oxyCODONE-acetaminophen (PERCOCET) 7.5-325 MG per tablet Take 2 tablets by mouth every 4 (four) hours as needed.     . pantoprazole (PROTONIX) 40 MG tablet Take 40 mg by mouth daily.    . Potassium Chloride CR (MICRO-K) 8 MEQ CPCR capsule CR Take 8 mEq by mouth 2 (two) times daily.    . Probiotic Product (ALIGN) 4 MG CAPS Take 1 capsule by mouth daily.    . ranitidine (ZANTAC) 300 MG capsule Take 300 mg by mouth every evening.     . sodium chloride (OCEAN) 0.65 % SOLN nasal spray Place 2 sprays into both nostrils daily as needed for congestion (congestion).     . SUMAtriptan (IMITREX) 100 MG tablet Take 100 mg x 1 may, repeat in 2 hour for migraine. 10 tablet 0  . tolterodine (DETROL LA) 4 MG 24 hr capsule Take 1 capsule (4  mg total) by mouth daily. 30 capsule 11  . traMADol (ULTRAM) 50 MG tablet Take 1 tablet (50 mg total) by mouth every 6 (six) hours as needed. 15 tablet 0  . valsartan (DIOVAN) 80 MG tablet TAKE 1 TABLET EVERY DAY 30 tablet 5  . vitamin E 400 UNIT capsule Take 800 Units by mouth daily.     No current facility-administered medications for this visit.    Allergies:   Lyrica; Penicillins; Phenytoin sodium extended; and Vicodin    Social History:  The patient  reports that she has never smoked. She has never used smokeless tobacco. She reports that she drinks alcohol. She reports that she does not use illicit drugs.   Family History:  The patient's family history includes Alcohol abuse in her father; Allergies in her other; Arthritis in her mother and sister; Breast cancer in her mother; Cancer in her brother; Clotting disorder in her mother; Diabetes in her mother and sister; Hypertension in her mother; Multiple sclerosis in her sister; Stroke in her mother.    ROS:  Please see the history of present illness.    Otherwise, review of systems positive for foot discomfort, varicose veins without edema, generalized lassitude without other symptoms of depression.   All other systems are reviewed and negative.    PHYSICAL EXAM: VS:  BP 184/98 mmHg  Pulse 82  Ht _0  (1.651 m)  Wt 230 lb (104.327 kg)  BMI 38.27 kg/m2 , BMI Body mass index is 38.27 kg/(m^2).  General: Alert, oriented x3, no distress Head: no evidence of trauma, PERRL, EOMI, no exophtalmos or lid lag, no myxedema, no xanthelasma; normal ears, nose and oropharynx Neck: normal jugular venous pulsations and no hepatojugular reflux;  brisk carotid pulses without delay and no carotid bruits Chest: clear to auscultation, no signs of consolidation by percussion or palpation, normal fremitus, symmetrical and full respiratory excursions Cardiovascular: normal position and quality of the apical impulse, regular rhythm, normal first and second heart sounds, no murmurs, rubs or gallops Abdomen: no tenderness or distention, no masses by palpation, no abnormal pulsatility or arterial bruits, normal bowel sounds, no hepatosplenomegaly Extremities: no clubbing, cyanosis or edema; 2+ radial, ulnar and brachial pulses bilaterally; 2+ right femoral, posterior tibial and dorsalis pedis pulses; 2+ left femoral, posterior tibial and dorsalis pedis pulses; no subclavian or femoral bruits Neurological: grossly nonfocal. Left orthopedic boot Psych: euthymic mood, full affect   EKG:  EKG is ordered today. The ekg ordered today demonstrates sinus rhythm, QS pattern in leads V1 and V2, no repolarization abnormalities, not meaningfully change from previous tracing. A single premature atrial beat is seen. As before there is questionable LVH by voltage only   Recent Labs: 05/19/2014: Hemoglobin 12.4; Platelets 279 10/19/2014: ALT 12; BUN 11; Creatinine 0.94; Potassium 4.6; Sodium 139    Lipid Panel    Component Value Date/Time   CHOL 151 10/19/2014 1137   TRIG 70.0 10/19/2014 1137   HDL 60.90 10/19/2014 1137   CHOLHDL 2 10/19/2014 1137   VLDL 14.0 10/19/2014 1137   LDLCALC 76 10/19/2014 1137      Wt Readings from Last 3 Encounters:  11/11/14 230 lb (104.327 kg)  11/09/14 239 lb 8 oz (108.636 kg)  10/26/14 240 lb 8 oz (109.09 kg)      ASSESSMENT AND PLAN:  1.  Pulmonary artery hypertension presumed secondary to venous thromboembolic disease. Her pulmonary artery systolic pressure has been stable for about 7 or 8 years as estimated by both echocardiography and  direct right heart catheterization measurement. This is indeed consistent with  a previous venous thromboembolic event, rather than a progressive collagen vascular disorder or untreated sleep apnea.  She has noticed recent exertional dyspnea but attributed this to deconditioning following foot surgery and seasonal allergies. I have asked her to call me if her symptoms do not improve as she regains exercise ability and the pollen season abates. If her breathlessness persists, would reevaluate pulmonary artery pressure by echocardiography.  Her blood pressure was surprisingly high in the office today that was coming down when we rechecked it. Just yesterday she had normal blood pressure. It may be attributable to pain in her left foot. Asked her to use meloxicam sparingly and avoid excessive sodium in her diet. Encouraged her to get a blood pressure cuff and call us with home monitored blood pressure readings   Current medicines are reviewed at length with the patient today.  The patient does not have concerns regarding medicines.  The following changes have been made:  no change  Labs/ tests ordered today include:  Orders Placed This Encounter  Procedures  . EKG 12-Lead   Patient Instructions  Your physician has requested that you regularly monitor and record your blood pressure readings at home. Please use the same machine at the same time of day to check your readings and record them to bring to your follow-up visit.  Blood Pressure Record Sheet  We recommend that you get a series of blood pressure readings done over a period of 5 days. It is best to get a reading in the morning and one in the evening. You should make sure to sit and relax for 1-5 minutes before the reading is taken. Write the readings down and make a follow-up appointment with your health care provider to discuss the results. If there is not a free clinic or a drug store with a blood-pressure-taking machine near you, you can purchase blood-pressure-taking equipment from a drug store. Having one in the  home allows you the convenience of taking your blood pressure while you are home and relaxed.   BLOOD PRESSURE LOG Date: _______________________  a.m. _____________________  p.m. _____________________ Date: _______________________  a.m. _____________________  p.m. _____________________ Date: _______________________  a.m. _____________________  p.m. _____________________ Date: _______________________  a.m. _____________________  p.m. _____________________ Date: _______________________  a.m. _____________________  p.m. _____________________ Document Released: 03/31/2003 Document Revised: 11/16/2013 Document Reviewed: 08/25/2013 ExitCare Patient Information 2015 [place], [place]. This information is not intended to replace advice given to you by your health care provider. Make sure you discuss any questions you have with your health care provider.  Dr. Sallyanne Kuster recommends that you schedule a follow-up appointment in: One year       Signed, Sanda Klein, MD  11/11/2014 11:50 AM    Sanda Klein, MD, [facility] [phone] office [phone] pager

## 2014-11-11 NOTE — Patient Instructions (Signed)
Your physician has requested that you regularly monitor and record your blood pressure readings at home. Please use the same machine at the same time of day to check your readings and record them to bring to your follow-up visit.  Blood Pressure Record Sheet  We recommend that you get a series of blood pressure readings done over a period of 5 days. It is best to get a reading in the morning and one in the evening. You should make sure to sit and relax for 1-5 minutes before the reading is taken. Write the readings down and make a follow-up appointment with your health care provider to discuss the results. If there is not a free clinic or a drug store with a blood-pressure-taking machine near you, you can purchase blood-pressure-taking equipment from a drug store. Having one in the home allows you the convenience of taking your blood pressure while you are home and relaxed.   BLOOD PRESSURE LOG Date: _______________________  a.m. _____________________  p.m. _____________________ Date: _______________________  a.m. _____________________  p.m. _____________________ Date: _______________________  a.m. _____________________  p.m. _____________________ Date: _______________________  a.m. _____________________  p.m. _____________________ Date: _______________________  a.m. _____________________  p.m. _____________________ Document Released: 03/31/2003 Document Revised: 11/16/2013 Document Reviewed: 08/25/2013 ExitCare Patient Information 2015 [place], [place]. This information is not intended to replace advice given to you by your health care provider. Make sure you discuss any questions you have with your health care provider.  Dr. Sallyanne Kuster recommends that you schedule a follow-up appointment in: One year

## 2014-11-17 DIAGNOSIS — M25572 Pain in left ankle and joints of left foot: Secondary | ICD-10-CM | POA: Diagnosis not present

## 2014-11-22 DIAGNOSIS — R140 Abdominal distension (gaseous): Secondary | ICD-10-CM | POA: Diagnosis not present

## 2014-11-22 DIAGNOSIS — K5900 Constipation, unspecified: Secondary | ICD-10-CM | POA: Diagnosis not present

## 2014-12-01 DIAGNOSIS — M25572 Pain in left ankle and joints of left foot: Secondary | ICD-10-CM | POA: Diagnosis not present

## 2014-12-03 DIAGNOSIS — J309 Allergic rhinitis, unspecified: Secondary | ICD-10-CM | POA: Diagnosis not present

## 2014-12-07 DIAGNOSIS — H47329 Drusen of optic disc, unspecified eye: Secondary | ICD-10-CM | POA: Diagnosis not present

## 2014-12-07 DIAGNOSIS — D518 Other vitamin B12 deficiency anemias: Secondary | ICD-10-CM | POA: Diagnosis not present

## 2014-12-07 DIAGNOSIS — E118 Type 2 diabetes mellitus with unspecified complications: Secondary | ICD-10-CM | POA: Diagnosis not present

## 2014-12-10 DIAGNOSIS — J4540 Moderate persistent asthma, uncomplicated: Secondary | ICD-10-CM | POA: Diagnosis not present

## 2014-12-10 DIAGNOSIS — J309 Allergic rhinitis, unspecified: Secondary | ICD-10-CM | POA: Diagnosis not present

## 2014-12-14 DIAGNOSIS — E118 Type 2 diabetes mellitus with unspecified complications: Secondary | ICD-10-CM | POA: Diagnosis not present

## 2014-12-14 DIAGNOSIS — J4540 Moderate persistent asthma, uncomplicated: Secondary | ICD-10-CM | POA: Diagnosis not present

## 2014-12-14 DIAGNOSIS — J309 Allergic rhinitis, unspecified: Secondary | ICD-10-CM | POA: Diagnosis not present

## 2014-12-14 DIAGNOSIS — G629 Polyneuropathy, unspecified: Secondary | ICD-10-CM | POA: Diagnosis not present

## 2014-12-26 ENCOUNTER — Other Ambulatory Visit: Payer: Self-pay | Admitting: Family Medicine

## 2014-12-27 ENCOUNTER — Telehealth: Payer: Self-pay | Admitting: Cardiovascular Disease

## 2014-12-27 NOTE — Telephone Encounter (Signed)
Pt obtained BP cuff for home use following instrutions from appt in April.  Gave directions of use on checking BP at home. Advised 1 week of readings and for her to call us back and inform of numbers.  Pt verbalized understanding of given instructions.

## 2014-12-27 NOTE — Telephone Encounter (Signed)
Ms.Haroon is calling because she have some questions about her blood Pressure . Please  Call   thanks

## 2014-12-29 DIAGNOSIS — M25572 Pain in left ankle and joints of left foot: Secondary | ICD-10-CM | POA: Diagnosis not present

## 2015-01-05 ENCOUNTER — Telehealth: Payer: Self-pay | Admitting: Cardiovascular Disease

## 2015-01-05 MED ORDER — VALSARTAN 160 MG PO TABS: 160.0000 mg | ORAL_TABLET | Freq: Every day | ORAL | Status: DC

## 2015-01-05 NOTE — Telephone Encounter (Signed)
Blood pressure is clearly high and consistent with what was recorded at her office appointment in April. Would like to have her come in at some point to make sure the monitor is accurate compared to our office blood pressure cuff. Increase valsartan to 160 mg daily. After roughly 1 week , call us back with her blood pressure readings.

## 2015-01-05 NOTE — Telephone Encounter (Signed)
Returned call to patient she stated she was calling to report B/P readings 142/106 p 82,145/95 p 77,158/118 p 104,150/106 p 77,141/102 p 86,158/109 p 80,151/103 p 84,119/99 p 64.Message sent to Dr.Croitoru for advice.

## 2015-01-05 NOTE — Telephone Encounter (Signed)
Returned call to patient Dr.Croitoru advised to increase valsartan to 160 mg daily.Stated she was prescribed Tolterodine 4 mg daily for bladder control.Stated she stopped furosemide 40 mg due to starting Tolterodine.Dr.Croitoru advised to start back furosemide 40 mg daily.Advised to call back in 1 week to report B/P readings.Appointment scheduled to have B/P checked by nurse 01/27/15 at 10:00 am.Advised to bring home B/P monitor to compare if working accurately.

## 2015-01-05 NOTE — Telephone Encounter (Signed)
[name] is calling to give results of her blood pressure .Marland Kitchen Please call    Thanks

## 2015-01-07 DIAGNOSIS — H40013 Open angle with borderline findings, low risk, bilateral: Secondary | ICD-10-CM | POA: Diagnosis not present

## 2015-01-07 DIAGNOSIS — H47329 Drusen of optic disc, unspecified eye: Secondary | ICD-10-CM | POA: Diagnosis not present

## 2015-01-11 ENCOUNTER — Ambulatory Visit (INDEPENDENT_AMBULATORY_CARE_PROVIDER_SITE_OTHER): Payer: Medicare Other | Admitting: Family Medicine

## 2015-01-11 ENCOUNTER — Encounter: Payer: Self-pay | Admitting: Family Medicine

## 2015-01-11 VITALS — BP 144/80 | HR 90 | Temp 97.4°F | Ht 64.5 in | Wt 240.5 lb

## 2015-01-11 DIAGNOSIS — E1340 Other specified diabetes mellitus with diabetic neuropathy, unspecified: Secondary | ICD-10-CM

## 2015-01-11 DIAGNOSIS — L299 Pruritus, unspecified: Secondary | ICD-10-CM | POA: Diagnosis not present

## 2015-01-11 DIAGNOSIS — M67879 Other specified disorders of synovium and tendon, unspecified ankle and foot: Secondary | ICD-10-CM | POA: Diagnosis not present

## 2015-01-11 DIAGNOSIS — M7660 Achilles tendinitis, unspecified leg: Secondary | ICD-10-CM | POA: Insufficient documentation

## 2015-01-11 LAB — HM DIABETES FOOT EXAM

## 2015-01-11 NOTE — Progress Notes (Signed)
Pre visit review using our clinic review tool, if applicable. No additional management support is needed unless otherwise documented below in the visit note. 

## 2015-01-11 NOTE — Assessment & Plan Note (Signed)
MAy be causing pain in ankles and itching, but very atypical symtpoms

## 2015-01-11 NOTE — Assessment & Plan Note (Signed)
?   Mild achilles tendonitis. Treat with home stretches, ice prn.

## 2015-01-11 NOTE — Assessment & Plan Note (Signed)
No rash.  ? Socks at night irritant, wool.  Treat with tpical hydrocortisone for itching.

## 2015-01-11 NOTE — Progress Notes (Signed)
Subjective:    Patient ID: [name], female    DOB: [birth year], 69 y.o.   MRN: [id number]  HPI  69 year old female with history of DVT, DM, HTN, asthma and pulmonary hypertension presents with new onset itching off and on all over x 1 week  Tried benadryl. Helped some.  Now itching and redness in right posterior heel in last 2 days.  Tried alcohol, calamine lotion, epsom salt bath. No help. When drying area yesterday achilles was sore. Pain with moving. Still sore in heel, but no pain with moving.  No fall, no change in activity.  She has been wearing sock.. May have wool in them, she is allergic to this.  Both feet numb with neuropathy.  Followed by neurologist, Dr. Arty Baumgartner. DM, well controlled Lab Results  Component Value Date   HGBA1C 6.2 10/19/2014     No new new med, increase valsartan last week. Was having some issues alady.    BP Readings from Last 3 Encounters:  01/11/15 144/80  11/11/14 184/98  11/09/14 130/78     Social History /Family History/Past Medical History reviewed and updated if needed.   Review of Systems  Constitutional: Negative for fever and fatigue.  HENT: Negative for ear pain.   Eyes: Negative for pain.  Respiratory: Negative for chest tightness and shortness of breath.   Cardiovascular: Negative for chest pain, palpitations and leg swelling.  Gastrointestinal: Negative for abdominal pain.  Genitourinary: Negative for dysuria.       Objective:   Physical Exam  Constitutional: Vital signs are normal. She appears well-developed and well-nourished. She is cooperative.  Non-toxic appearance. She does not appear ill. No distress.  HENT:  Head: Normocephalic.  Right Ear: Hearing, tympanic membrane, external ear and ear canal normal. Tympanic membrane is not erythematous, not retracted and not bulging.  Left Ear: Hearing, tympanic membrane, external ear and ear canal normal. Tympanic membrane is not erythematous, not retracted and not  bulging.  Nose: No mucosal edema or rhinorrhea. Right sinus exhibits no maxillary sinus tenderness and no frontal sinus tenderness. Left sinus exhibits no maxillary sinus tenderness and no frontal sinus tenderness.  Mouth/Throat: Uvula is midline, oropharynx is clear and moist and mucous membranes are normal.  Eyes: Conjunctivae, EOM and lids are normal. Pupils are equal, round, and reactive to light. Lids are everted and swept, no foreign bodies found.  Neck: Trachea normal and normal range of motion. Neck supple. Carotid bruit is not present. No thyroid mass and no thyromegaly present.  Cardiovascular: Normal rate, regular rhythm, S1 normal, S2 normal, normal heart sounds, intact distal pulses and normal pulses.  Exam reveals no gallop and no friction rub.   No murmur heard. Pulmonary/Chest: Effort normal and breath sounds normal. No tachypnea. No respiratory distress. She has no decreased breath sounds. She has no wheezes. She has no rhonchi. She has no rales.  Abdominal: Soft. Normal appearance and bowel sounds are normal. There is no tenderness.  Neurological: She is alert.  Skin: Skin is warm, dry and intact. No rash noted.  Psychiatric: Her speech is normal and behavior is normal. Judgment and thought content normal. Her mood appears not anxious. Cognition and memory are normal. She does not exhibit a depressed mood.   Diabetic foot exam: Right foot Normal inspection No skin breakdown, slight darkening at medial achilles ttp at insertion of achilles, slightly decrease ROM due to pain. No calluses  Normal DP pulses Decreased sensation to light touch and monofilament Nails  normal         Assessment & Plan:

## 2015-01-11 NOTE — Addendum Note (Signed)
Addended by: Carter Kitten on: 01/11/2015 04:28 PM   Modules accepted: Orders, Medications

## 2015-01-11 NOTE — Patient Instructions (Signed)
Apply cortisone 10 OTC to heel. Stop the socks at bed. Start achilles tendon stretching exercises. Follow up if not improving as expected.

## 2015-01-11 NOTE — Addendum Note (Signed)
Addended by: Carter Kitten on: 01/11/2015 04:21 PM   Modules accepted: Orders, Medications

## 2015-01-12 ENCOUNTER — Telehealth: Payer: Self-pay | Admitting: Cardiovascular Disease

## 2015-01-12 NOTE — Telephone Encounter (Signed)
Please increase valsartan to 320 mg daily and add amlodipine 5 mg daily,  Get her monitor checked and report BP again in 7-10 days please

## 2015-01-12 NOTE — Telephone Encounter (Signed)
Returned pt call. She is reporting BP/HR taken daily:  130/109 86 152/103 114 150/109 83 146/100 108 153/112 80 156/100 101 157/104 93 128/100 109 132/116 103  Has upcoming BP visit in office to check machine calibration - advised to keep appt, call us if any new problems. Pt voiced understanding.

## 2015-01-12 NOTE — Telephone Encounter (Signed)
Left message on pt's machine instructing to call.

## 2015-01-12 NOTE — Telephone Encounter (Signed)
Barbara instructed the pt to take her BP for the week and then call back in to report those numbers to see if her medications need to be adjusted. Please f/u with her   Thanks

## 2015-01-14 MED ORDER — AMLODIPINE BESYLATE 5 MG PO TABS: 5.0000 mg | ORAL_TABLET | Freq: Every day | ORAL | Status: DC

## 2015-01-14 NOTE — Telephone Encounter (Signed)
Pt returned my call - i gave instruction on new meds/changes, follow up for BP check. Advised her to continue to monitor BPs in interim, call for new/changing symptoms.  Pt voiced understanding of instructions received.

## 2015-01-14 NOTE — Telephone Encounter (Signed)
Has this been taken care of?

## 2015-01-18 ENCOUNTER — Telehealth: Payer: Self-pay | Admitting: Cardiovascular Disease

## 2015-01-18 DIAGNOSIS — J4540 Moderate persistent asthma, uncomplicated: Secondary | ICD-10-CM | POA: Diagnosis not present

## 2015-01-18 NOTE — Telephone Encounter (Signed)
Called patient - she notes feeling tired w/ starting amlodipine, increased valsartan dose.  Advised to continue current dose, update in a few days w/ BP trends. Informed fatigue should resolve or improve, would reassess at her BP appt if no new problems.   Pt voiced understanding/agreement w/ plan.

## 2015-01-18 NOTE — Telephone Encounter (Signed)
Pt is calling in to report her BP levels and to speak with a nurse about adjusting her medications. Please f/u with her  Thanks

## 2015-01-19 DIAGNOSIS — J4540 Moderate persistent asthma, uncomplicated: Secondary | ICD-10-CM | POA: Diagnosis not present

## 2015-01-27 ENCOUNTER — Ambulatory Visit (INDEPENDENT_AMBULATORY_CARE_PROVIDER_SITE_OTHER): Payer: Medicare Other | Admitting: *Deleted

## 2015-01-27 VITALS — BP 142/90 | HR 86

## 2015-01-27 DIAGNOSIS — I10 Essential (primary) hypertension: Secondary | ICD-10-CM

## 2015-01-27 MED ORDER — FUROSEMIDE 40 MG PO TABS: 40.0000 mg | ORAL_TABLET | Freq: Every day | ORAL | Status: DC

## 2015-01-27 NOTE — Progress Notes (Signed)
Patient reports she tried to increase valsartan from 160mg  to 320mg  daily as recommend per last triage call. She was unable to take increase dose related to fatigue, feeling like she would pass out, dizziness. She has continued to take valsartan 160mg  and amlodipine 5mg  daily. She reports she has been itching more since she tried to increase valsartan. She also reports she has been taking furosemide daily instead of as needed.   BP checked in office 142/90 pulse palpated 86bpm (regular)  Patient used wrist BP cuff to check BP and HR in office (134/97, HR 86)    Last few home readings  137/96 HR 87  142/95 HR 92   Saturday PM 142/80 - HR 122 (squiggly symbol on BP cuff signifying HR was irregular?)           152/110           137/96 * Chest was hurting Saturday and had a real bad headache     Reports she had a BP reading last week of 51/26 (?) and HR over 100bpm - complained of dizziness  Patient reports home BP/HR readings consistently show HR > 80bpm  If med titration necessary, titrate amlodipine or add beta-blocker (for heart rate?) - Unable to tolerate increase ARB dose.

## 2015-01-28 ENCOUNTER — Telehealth: Payer: Self-pay | Admitting: Pharmacist Clinician (PhC)/ Clinical Pharmacy Specialist

## 2015-01-28 MED ORDER — AMLODIPINE BESYLATE 2.5 MG PO TABS: 7.5000 mg | ORAL_TABLET | Freq: Every day | ORAL | Status: DC

## 2015-01-28 MED ORDER — AMLODIPINE BESYLATE 5 MG PO TABS: 7.5000 mg | ORAL_TABLET | Freq: Every day | ORAL | Status: DC

## 2015-01-28 NOTE — Telephone Encounter (Signed)
Pt called, asking [place] to refill amlodipine at 7.5 mg, as she will be out of medication in the next few days.  Will send to [place] in [place]

## 2015-01-28 NOTE — Progress Notes (Signed)
Reviewed note from RN and patient chart.  Called patient and advised to increase amlodipine to 7.5 mg once daily.  She is to call office if this causes problems or increased dizziness.  Pt voiced understanding

## 2015-02-01 NOTE — Progress Notes (Signed)
Keep medicines the same as she is taking for now. Follow-up with Dr. Loletha Grayer when he returns.  Dr. Lemmie Evens

## 2015-02-07 ENCOUNTER — Telehealth: Payer: Self-pay | Admitting: Cardiovascular Disease

## 2015-02-07 NOTE — Telephone Encounter (Signed)
Please call,she needs to talk to about her medication. It is making her feel worse.

## 2015-02-07 NOTE — Telephone Encounter (Signed)
Pt somewhat emotional due to poor sleep pattern in past few days.  Suggested that she switch her amlodipine and valsartan to night, with her next dose tomorrow night, since she already took this am.  Encouraged her to stay awake as much as possible for the rest of today and it might take 2-3 days to get her sleep cycle turned back around.  Suggested she call me on Thursday or Friday to let me know how she's doing.  Her BP has actually been much better this past week, so hopefully she will do well with nighttime dosing.

## 2015-02-07 NOTE — Telephone Encounter (Signed)
Spoke with pt, she continues to have problems with sleeping during the day and being up at night. She feels this is getting worse since the medications have been adjusted. She is taking both the amlodipine and valsartan in the morning. Her bp is ranging from 121-144/94-88 with pulse 74 to 93. Will forward to kristin alvsted pharm md

## 2015-02-10 DIAGNOSIS — M25571 Pain in right ankle and joints of right foot: Secondary | ICD-10-CM | POA: Diagnosis not present

## 2015-02-10 DIAGNOSIS — G5751 Tarsal tunnel syndrome, right lower limb: Secondary | ICD-10-CM | POA: Diagnosis not present

## 2015-02-10 DIAGNOSIS — M7751 Other enthesopathy of right foot: Secondary | ICD-10-CM | POA: Diagnosis not present

## 2015-02-15 ENCOUNTER — Telehealth: Payer: Self-pay | Admitting: Cardiovascular Disease

## 2015-02-15 NOTE — Telephone Encounter (Signed)
Please call,need to give you an update on how her blood pressure is doing.

## 2015-02-15 NOTE — Telephone Encounter (Signed)
Spoke with pt, she called to report bp 140/72 pulse 84,127/85 88,140/93 83, 133/90 85 and 131/82 97. She reports her sleep patterns are the same.  Will forward to [employer]

## 2015-02-17 NOTE — Telephone Encounter (Signed)
LMOM for patient, BP numbers are looking better.  Not sure what to do about her problems with sleep.  Amlodipine reports a < 1% risk of insomina, but 5% risk of fatigue and 1% drowsiness.  Asked that she let us know if she believes it's related to amlodipine, otherwise continue with her dose and see PCP for sleep issues.

## 2015-02-18 DIAGNOSIS — J4540 Moderate persistent asthma, uncomplicated: Secondary | ICD-10-CM | POA: Diagnosis not present

## 2015-02-21 DIAGNOSIS — J4540 Moderate persistent asthma, uncomplicated: Secondary | ICD-10-CM | POA: Diagnosis not present

## 2015-02-25 DIAGNOSIS — M792 Neuralgia and neuritis, unspecified: Secondary | ICD-10-CM | POA: Diagnosis not present

## 2015-02-25 DIAGNOSIS — L608 Other nail disorders: Secondary | ICD-10-CM | POA: Diagnosis not present

## 2015-02-25 DIAGNOSIS — M79672 Pain in left foot: Secondary | ICD-10-CM | POA: Diagnosis not present

## 2015-03-10 ENCOUNTER — Telehealth: Payer: Self-pay | Admitting: Cardiovascular Disease

## 2015-03-10 MED ORDER — AMLODIPINE BESYLATE 2.5 MG PO TABS: 10.0000 mg | ORAL_TABLET | Freq: Every day | ORAL | Status: DC

## 2015-03-10 NOTE — Telephone Encounter (Signed)
8/15   159/97 P 88  143/86 P 78 8/17   122/91 P 86 8/20   150/84 P 92 8/21   150/91 P 78   132/93 P 81 8/22   127/82 P96 8/25   123/94 P 86  Patient said that she is taking her blood pressure with device that wraps around her wrist

## 2015-03-10 NOTE — Telephone Encounter (Signed)
[name] is calling  to clarify the medication (Amolodpine)  Not sure of the mg .Marland Kitchen Please call  Thanks

## 2015-03-10 NOTE — Telephone Encounter (Signed)
Pilar Plate from patients pharmacy asked if we can change her dose to one 10 mg Amlodipine tablet moving forward after this scriot  Gave verbal telephone order for change.  Called patient to caution her that next script she picks up she needs to check the dosage of the pill.  That it should be Amlodipine 10 mg , 1 pill daily.

## 2015-03-10 NOTE — Telephone Encounter (Signed)
Please increase amlodipine to 10 mg daily. Will help BP and she won't need to take the current awkward dose.

## 2015-03-10 NOTE — Telephone Encounter (Signed)
Called and told patient that she is to increase her Amlodipine to 10 mg  Patient has 2.5 mg on hand and is able to tell me that she will be taking four 2.5 mg tablets a day  Will continue to monitor daily blood pressure

## 2015-03-10 NOTE — Telephone Encounter (Signed)
Patient would like to give the nurse her recent blood pressure readings.

## 2015-03-15 DIAGNOSIS — L299 Pruritus, unspecified: Secondary | ICD-10-CM | POA: Diagnosis not present

## 2015-03-15 DIAGNOSIS — H903 Sensorineural hearing loss, bilateral: Secondary | ICD-10-CM | POA: Diagnosis not present

## 2015-03-15 DIAGNOSIS — H9313 Tinnitus, bilateral: Secondary | ICD-10-CM | POA: Diagnosis not present

## 2015-03-22 DIAGNOSIS — J4540 Moderate persistent asthma, uncomplicated: Secondary | ICD-10-CM | POA: Diagnosis not present

## 2015-03-23 DIAGNOSIS — J4540 Moderate persistent asthma, uncomplicated: Secondary | ICD-10-CM | POA: Diagnosis not present

## 2015-03-25 ENCOUNTER — Telehealth: Payer: Self-pay | Admitting: Cardiovascular Disease

## 2015-03-25 ENCOUNTER — Other Ambulatory Visit: Payer: Self-pay

## 2015-03-25 MED ORDER — OMALIZUMAB 150 MG ~~LOC~~ SOLR
300.0000 mg | SUBCUTANEOUS | Status: DC
  Administered 2015-04-19 – 2020-09-06 (×60): 300 mg via SUBCUTANEOUS

## 2015-03-25 NOTE — Telephone Encounter (Signed)
New Prob  Pt c/o BP issue: STAT if pt c/o blurred vision, one-sided weakness or slurred speech  1. What are your last 5 BP readings?  8/31: 152/106 156/96 9/1: 147/95 9/5: 137/86 9/6: 156/90 150/90 9/8: 143/103 150/83  2. Are you having any other symptoms (ex. Dizziness, headache, blurred vision, passed out)? No  3. What is your BP issue? Pt states she was advised to call in and report her blood pressure readings

## 2015-03-27 ENCOUNTER — Other Ambulatory Visit: Payer: Self-pay | Admitting: Family Medicine

## 2015-03-28 NOTE — Telephone Encounter (Signed)
BP actually higher. Please confirm she is taking Valsartan 160 mg BID, amlodipine 10 mg daily, furosemide 40 mg daily and following a low salt diet

## 2015-03-28 NOTE — Telephone Encounter (Signed)
Patient was asked to call in a record of her blood pressure readings  8/31: 152/106 156/96 9/1: 147/95 9/5: 137/86 9/6: 156/90 150/90 9/8: 143/103 150/83

## 2015-04-04 ENCOUNTER — Telehealth: Payer: Self-pay | Admitting: Cardiovascular Disease

## 2015-04-04 NOTE — Telephone Encounter (Signed)
Please make sure she brings the home monitor with her to the appointment. Maybe it is not accurate.

## 2015-04-04 NOTE — Telephone Encounter (Signed)
Pt called in wanting to speak with the nurse about her BP readings. Please f/u with her  Thanks

## 2015-04-04 NOTE — Progress Notes (Signed)
Cardiology Office Note   Date:  04/05/2015   ID:  [name] 26-Sep-1945, MRN [id number]  PCP:  [name], MD  Cardiologist:  Dr. Sanda Klein   Electrophysiologist:  n/a  Chief Complaint  Patient presents with  . Follow-up    HTN     History of Present Illness: [name] is a 69 y.o. female with a hx of pulmonary artery hypertension and previous venous thromboembolic disease.  Echocardiogram in 2014 showed an estimated systolic PA pressure 48 mmHg. She does not have a history of chronic lung disease, congestive heart failure, obstructive sleep apnea or collagen vascular disease. The presumed cause of her pulmonary hypertension is previous venous thrombolic embolic events. She had pulmonary embolism in 2004 following 2 consecutive foot surgeries.  She was treated with Coumadin for a year.  Hypercoag panel was neg except for + Lupus anticoagulant.  [place] in 2012 demonstrated RA 16,RV 54/15,PA 50/20, PCWP 16-20, CO 4.8, mean PA 30, and SVR calculated to 2.5 wood units.  During that hospitalization off coumadin her ESR was normal, protein C normall, protein S level normal but activity decreased?, neg anticardiolipin Ab, and lupus anticoagulant was not detected. She has had a CT chest which showed no parenchymal lung disease or other abnormality. Reportedly she had a normal sleep study in the past.  At her last visit with Dr. Dani Gobble Croitoru in 4/16, her blood pressure was markedly elevated at 184/98. After just a few minutes it dropped to 160/90 mmHg.  It was previously normal the day before her visit and she was asked to monitor her BP at home.  She later called in in 6/16 with elevated readings her Valsartan was increased.  BPs remained elevated and her Valsartan was increased further and she was placed on Amlodipine.  Her BP has been hard to control and her medication dosages have been steadily increased over time.  She called in recently feeling tired and melancholy since  starting the higher dose of Amlodipine. She was added on to my schedule for further evaluation.    She has felt quite fatigued, lethargic and dizzy since her medications have been adjusted. She brings in her blood pressure cuff today. I had her check her blood pressure here and it was 122/80. I rechecked her blood pressure and it was 110/70 bilaterally. She has difficulty sleeping. After increasing the dose of her medicines, she became disoriented on one occasion after falling asleep for about 45 minutes. She has dyspnea on exertion-related to asthma. She has a nonproductive cough and wheezing. This is all stable. She denies chest pain, orthopnea, PND or edema. She denies syncope. She has felt near syncopal at times.   Studies/Reports Reviewed Today:  Echo 11/20/12 Mild to mod LVH, vigorous LVF, EF 70-75%, Gr 1 DD, MAC, mid late MVP involving anterior leaflet, mild BAE, PASP 48 mmHg (mod to severe pulmonary HTN)  Myoview 11/07 Low risk, no ischemia, EF 74%  R/L Heart Cath 6/12 IMPRESSION: 1. Normal hyperdynamic left ventricular function. 2. Elevation of right heart pressures with moderate pulmonary  hypertension with right ventricle systolic and PA pressures ranging  now at 29-51 mm systolically. 3. Normal coronary arteries. 4. No evidence for significant aortoiliac disease or renovascular  hypertension.  Past Medical History  Diagnosis Date  . Hypertension   . DVT (deep venous thrombosis)   . Pulmonary embolism   . Diabetes mellitus   . Seizures   . Allergic rhinitis   . Chronic headache   .  Pulmonary hypertension   . Dyspnea   . Asthma   . Arthritis   . Allergy   . Heart murmur     Past Surgical History  Procedure Laterality Date  . Knee surgery    . Plantar fascia surgery    . Partial hysterectomy    . Tonsillectomy    . Cardiac catheterization  12/28/2010    Mod. pulmonary hypertension, normal coronary arteries  . Doppler echocardiography  10/08/2011     EF=>55%,mild asymmetric LVH, mod. TR, mod. PH, mild to mod LA dilatation  . Nuclear stress test  05/20/2006    No ischemia  . Abdominal hysterectomy      partial, has ovaries  . Knee arthroscopy Left      Current Outpatient Prescriptions  Medication Sig Dispense Refill  . acetaminophen (TYLENOL) 325 MG tablet Take 650 mg by mouth every 6 (six) hours as needed for moderate pain (pain). pain    . albuterol (PROAIR HFA) 108 (90 BASE) MCG/ACT inhaler Inhale 2 puffs into the lungs every 6 (six) hours as needed for wheezing (wheezing). Shortness of breath    . albuterol (PROVENTIL HFA) 108 (90 BASE) MCG/ACT inhaler Inhale 2 puffs into the lungs every 4 (four) hours as needed for wheezing or shortness of breath.    Marland Kitchen albuterol (PROVENTIL) (2.5 MG/3ML) 0.083% nebulizer solution Take 2.5 mg by nebulization every 4 (four) hours as needed for wheezing or shortness of breath.    Marland Kitchen amLODipine (NORVASC) 5 MG tablet Take 1 tablet (5 mg total) by mouth daily. 90 tablet 3  . aspirin 81 MG tablet Take 81 mg by mouth daily.     . Azelastine-Fluticasone (DYMISTA) 137-50 MCG/ACT SUSP Place 1 puff into the nose every other day.     . beclomethasone (QVAR) 80 MCG/ACT inhaler Inhale 2 puffs into the lungs 2 (two) times daily.      . Biotin 1 MG CAPS Take 1 mg by mouth as needed (muscles).     . cyclobenzaprine (FLEXERIL) 10 MG tablet Take 10 mg by mouth daily as needed for muscle spasms.     Marland Kitchen dextromethorphan-guaiFENesin (MUCINEX DM) 30-600 MG per 12 hr tablet Take 1 tablet by mouth as needed (congestion).     . DiphenhydrAMINE HCl (BENADRYL PO) Take 1 tablet by mouth daily as needed (itching).     . DULoxetine (CYMBALTA) 30 MG capsule Take 30 mg by mouth daily.    Marland Kitchen EPINEPHrine (EPIPEN 2-PAK) 0.3 mg/0.3 mL IJ SOAJ injection Inject 0.3 mg into the muscle once.    . fluticasone-salmeterol (ADVAIR HFA) 230-21 MCG/ACT inhaler Inhale 2 puffs into the lungs 2 (two) times daily.    . furosemide (LASIX) 40 MG tablet Take  1 tablet (40 mg total) by mouth daily. 30 tablet 3  . gabapentin (NEURONTIN) 100 MG capsule Take 100 mg by mouth as needed (nerves).     . Ginkgo Biloba 40 MG TABS Take 1 tablet by mouth 3 (three) times daily.    . Lactase (DAIRY-RELIEF PO) Take 1 capsule by mouth daily as needed (stomach upset).     . Magnesium 250 MG TABS Take 1 tablet by mouth daily.    . meloxicam (MOBIC) 15 MG tablet Take 15 mg by mouth daily.    . metFORMIN (GLUCOPHAGE) 500 MG tablet TAKE 1 TABLET (500 MG TOTAL) BY MOUTH 2 (TWO) TIMES DAILY WITH A MEAL. 60 tablet 2  . montelukast (SINGULAIR) 10 MG tablet Take 10 mg by mouth daily.      Marland Kitchen  Olopatadine HCl (PATADAY) 0.2 % SOLN Apply 1 drop to eye daily as needed (dry eyes).     . Omega 3-6-9 Fatty Acids (OMEGA 3-6-9 COMPLEX PO) Take 2 capsules by mouth daily.    . ondansetron (ZOFRAN ODT) 4 MG disintegrating tablet Take 1 tablet (4 mg total) by mouth every 8 (eight) hours as needed for nausea or vomiting. 10 tablet 0  . ondansetron (ZOFRAN) 4 MG tablet Take 4 mg by mouth every 8 (eight) hours as needed for nausea or vomiting (nausea).    [name] DELICA LANCETS 93T MISC as needed (blood check).     [name] VERIO test strip 1 each by Other route as needed (blood monitoring).     Marland Kitchen oxyCODONE-acetaminophen (PERCOCET) 7.5-325 MG per tablet Take 2 tablets by mouth every 4 (four) hours as needed for moderate pain.     . pantoprazole (PROTONIX) 40 MG tablet Take 40 mg by mouth daily.    . Potassium Chloride CR (MICRO-K) 8 MEQ CPCR capsule CR Take 8 mEq by mouth 2 (two) times daily.    . Probiotic Product (ALIGN) 4 MG CAPS Take 1 capsule by mouth daily.    . ranitidine (ZANTAC) 300 MG capsule Take 300 mg by mouth every evening.    . sodium chloride (OCEAN) 0.65 % SOLN nasal spray Place 2 sprays into both nostrils daily as needed for congestion (congestion).     . SUMAtriptan (IMITREX) 100 MG tablet Take 100 mg x 1 may, repeat in 2 hour for migraine. 10 tablet 0  . traMADol  (ULTRAM) 50 MG tablet Take 1 tablet (50 mg total) by mouth every 6 (six) hours as needed. (Patient taking differently: Take 50 mg by mouth every 6 (six) hours as needed for severe pain. ) 15 tablet 0  . traMADol (ULTRAM) 50 MG tablet Take by mouth every 6 (six) hours as needed for severe pain.    . valsartan (DIOVAN) 160 MG tablet Take 1 tablet (160 mg total) by mouth daily. 30 tablet 6   Current Facility-Administered Medications  Medication Dose Route Frequency Provider Last Rate Last Dose  . [START ON 04/12/2015] omalizumab Arvid Right) injection 300 mg  300 mg Subcutaneous Q28 days [name], MD        Allergies:   Lyrica; Penicillins; Phenytoin sodium extended; and Vicodin    Social History:  The patient  reports that she has never smoked. She has never used smokeless tobacco. She reports that she drinks alcohol. She reports that she does not use illicit drugs.   Family History:  The patient's family history includes Alcohol abuse in her father; Allergies in her other; Arthritis in her mother and sister; Breast cancer in her mother; Cancer in her brother; Clotting disorder in her mother; Diabetes in her mother and sister; Hypertension in her mother; Multiple sclerosis in her sister; Stroke in her mother.    ROS:   Please see the history of present illness.   Review of Systems  Constitution: Positive for malaise/fatigue.  HENT: Positive for headaches.   Respiratory: Positive for wheezing.   Musculoskeletal: Positive for myalgias.  Gastrointestinal: Positive for nausea.  Neurological: Positive for dizziness.  All other systems reviewed and are negative.     PHYSICAL EXAM: VS:  BP 118/70 mmHg  Pulse 80  Ht '5\' 5"'  (1.651 m)  Wt 236 lb (107.049 kg)  BMI 39.27 kg/m2    Wt Readings from Last 3 Encounters:  04/05/15 236 lb (107.049 kg)  01/11/15 240  lb 8 oz (109.09 kg)  11/11/14 230 lb (104.327 kg)     GEN: Well nourished, well developed, in no acute distress HEENT:  normal Neck: no JVD,   no masses Cardiac:  Normal S1/S2, RRR; short 1/6 systolic murmur RUSB,  no rubs or gallops, no edema   Respiratory:  clear to auscultation bilaterally, no wheezing, rhonchi or rales. GI: soft, nontender, nondistended, + BS MS: no deformity or atrophy Skin: warm and dry  Neuro:  CNs II-XII intact, Strength and sensation are intact Psych: Normal affect   EKG:  EKG is ordered today.  It demonstrates:   NSR, HR 80, normal axis, NSSTTW changes, QTc 433 ms, no significant change since prior tracing.    Recent Labs: 05/19/2014: Hemoglobin 12.4; Platelets 279 10/19/2014: ALT 12; BUN 11; Creatinine, Ser 0.94; Potassium 4.6; Sodium 139    Lipid Panel    Component Value Date/Time   CHOL 151 10/19/2014 1137   TRIG 70.0 10/19/2014 1137   HDL 60.90 10/19/2014 1137   CHOLHDL 2 10/19/2014 1137   VLDL 14.0 10/19/2014 1137   LDLCALC 76 10/19/2014 1137      ASSESSMENT AND PLAN:  1. Hypertension:  She has had significant side effects to increased doses of medications. She takes both medications at nighttime. She's had significant issues with sleeping. Her blood pressure is actually optimal today. However, I do not think she is tolerating it. I also question whether or not her blood pressure cuff is completely accurate 100% of the time. It is a wrist cuff. I have recommended decreasing her amlodipine to 5 mg daily. I have recommended that she take her amlodipine and valsartan 12 hours apart. Check BMET, CBC, TSH today. Arrange 24-hour ambulatory blood pressure monitor. I have asked her to check her own blood pressure with her wrist cuff a couple of times while she is wearing the monitor so we can compare. We may need to tolerate higher blood pressure to avoid symptoms. Hopefully, we can keep her blood pressure less than 140/90.  2. Pulmonary HTN:  Felt to be related to prior thromboembolic disease.  Last Echo 2014 with PASP 48.       Medication Changes: Current medicines are  reviewed at length with the patient today.  Concerns regarding medicines are as outlined above.  The following changes have been made:   Discontinued Medications   AMLODIPINE (NORVASC) 2.5 MG TABLET    Take 4 tablets (10 mg total) by mouth daily.   AMLODIPINE (NORVASC) 5 MG TABLET    Take 10 mg by mouth daily.    VALSARTAN (DIOVAN) 160 MG TABLET    Take 160 mg by mouth 2 (two) times daily.   Modified Medications   Modified Medication Previous Medication   AMLODIPINE (NORVASC) 5 MG TABLET amLODipine (NORVASC) 10 MG tablet      Take 1 tablet (5 mg total) by mouth daily.    Take 10 mg by mouth daily.   New Prescriptions   No medications on file    Labs/ tests ordered today include:   Orders Placed This Encounter  Procedures  . Basic Metabolic Panel (BMET)  . CBC w/Diff  . TSH  . Holter monitor - 24 hour  . EKG 12-Lead      Disposition:    FU with me in 4 weeks.    Signed, Versie Starks, MHS 04/05/2015 11:50 AM    [facility] [address], [place], [place]  [address] Phone: [phone];  Fax: [phone]

## 2015-04-04 NOTE — Telephone Encounter (Signed)
Can this encounter be closed?

## 2015-04-04 NOTE — Telephone Encounter (Signed)
Patient says she has been very tired out since her Amlodipine has been increased to 10 mg.    Was taking Valsartan 160 mg but it was making her too out of it and went down to once a day. Is keeping salt in diet low and taking her Lasix  BP readings:  9/09  129/84  83 9/14  136/83  78          136/80  94 9/15  152/89  82 ir          152/88  88 ir 9/16  151/91  85 ir 9/17  159/103 93 r 9/19  143/52   85 r  Feeling miserable, too tired to get up, sleeping all the time.  Made a Flex appointment with Richardson Dopp for tomorrow at [place] regular OV for June 02, 2015 with Dr. Sallyanne Kuster  Feels misserable, crying on phone

## 2015-04-05 ENCOUNTER — Encounter: Payer: Self-pay | Admitting: Physician Assistant

## 2015-04-05 ENCOUNTER — Ambulatory Visit (INDEPENDENT_AMBULATORY_CARE_PROVIDER_SITE_OTHER): Payer: Medicare Other | Admitting: Physician Assistant

## 2015-04-05 VITALS — BP 118/70 | HR 80 | Ht 65.0 in | Wt 236.0 lb

## 2015-04-05 DIAGNOSIS — I10 Essential (primary) hypertension: Secondary | ICD-10-CM | POA: Diagnosis not present

## 2015-04-05 DIAGNOSIS — I270 Primary pulmonary hypertension: Secondary | ICD-10-CM

## 2015-04-05 DIAGNOSIS — I272 Pulmonary hypertension, unspecified: Secondary | ICD-10-CM

## 2015-04-05 LAB — CBC WITH DIFFERENTIAL/PLATELET
Basophils Absolute: 0.1 10*3/uL (ref 0.0–0.1)
Basophils Relative: 0.6 % (ref 0.0–3.0)
Eosinophils Absolute: 0.2 10*3/uL (ref 0.0–0.7)
Eosinophils Relative: 2.3 % (ref 0.0–5.0)
HCT: 39.7 % (ref 36.0–46.0)
Hemoglobin: 13 g/dL (ref 12.0–15.0)
Lymphocytes Relative: 34.4 % (ref 12.0–46.0)
Lymphs Abs: 3 10*3/uL (ref 0.7–4.0)
MCHC: 32.8 g/dL (ref 30.0–36.0)
MCV: 79.2 fl (ref 78.0–100.0)
Monocytes Absolute: 0.6 10*3/uL (ref 0.1–1.0)
Monocytes Relative: 6.8 % (ref 3.0–12.0)
Neutro Abs: 4.8 10*3/uL (ref 1.4–7.7)
Neutrophils Relative %: 55.9 % (ref 43.0–77.0)
Platelets: 338 10*3/uL (ref 150.0–400.0)
RBC: 5.01 Mil/uL (ref 3.87–5.11)
RDW: 15.3 % (ref 11.5–15.5)
WBC: 8.6 10*3/uL (ref 4.0–10.5)

## 2015-04-05 LAB — BASIC METABOLIC PANEL
BUN: 16 mg/dL (ref 6–23)
CO2: 26 mEq/L (ref 19–32)
Calcium: 9.6 mg/dL (ref 8.4–10.5)
Chloride: 101 mEq/L (ref 96–112)
Creatinine, Ser: 1.03 mg/dL (ref 0.40–1.20)
GFR: 68.39 mL/min (ref >60.00)
Glucose, Bld: 150 mg/dL — ABNORMAL HIGH (ref 70–99)
Potassium: 4.1 mEq/L (ref 3.5–5.1)
Sodium: 138 mEq/L (ref 135–145)

## 2015-04-05 LAB — TSH: TSH: 0.59 u[IU]/mL (ref 0.35–4.50)

## 2015-04-05 MED ORDER — AMLODIPINE BESYLATE 5 MG PO TABS: 5.0000 mg | ORAL_TABLET | Freq: Every day | ORAL | Status: DC

## 2015-04-05 NOTE — Patient Instructions (Signed)
Medication Instructions:  1. DECREASE AMLODIPINE TO 5 MG DAILY  2. MAKE SURE TO SPACE OUT THE VALSARTAN AND AMLODIPINE 12 HOURS APART FROM EACH OTHER  Labwork: TODAY BMET, CBC W/DIFF, TSH  Testing/Procedures: YOU WILL NEED A 24 HOUR BLOOD PRESSURE TO BE DONE IN 2 WEEKS  Follow-Up: [facility], PAC 4 WEEKS  Any Other Special Instructions Will Be Listed Below (If Applicable). OK TO CHECK BP 2-3 TIMES WEEKLY WHILE WEARING MONITOR

## 2015-04-05 NOTE — Telephone Encounter (Signed)
Called patient this morning to remind her to bring her BP device from home to check for accuracy

## 2015-04-07 DIAGNOSIS — G629 Polyneuropathy, unspecified: Secondary | ICD-10-CM | POA: Diagnosis not present

## 2015-04-07 DIAGNOSIS — E118 Type 2 diabetes mellitus with unspecified complications: Secondary | ICD-10-CM | POA: Diagnosis not present

## 2015-04-12 ENCOUNTER — Telehealth: Payer: Self-pay | Admitting: Allergy and Immunology

## 2015-04-12 NOTE — Telephone Encounter (Signed)
Talked to patient advised per Dr Neldon Mc get Prevnar followed in 6 months by Pneumovax.  Also advised to contact her primary MD for vaccines since we do not give them

## 2015-04-12 NOTE — Telephone Encounter (Signed)
PT NEEDS TO KNOW WHAT KIND OF PNEUMONIA SHOTS TO GET.

## 2015-04-13 DIAGNOSIS — G629 Polyneuropathy, unspecified: Secondary | ICD-10-CM | POA: Diagnosis not present

## 2015-04-13 DIAGNOSIS — Z23 Encounter for immunization: Secondary | ICD-10-CM | POA: Diagnosis not present

## 2015-04-13 DIAGNOSIS — E789 Disorder of lipoprotein metabolism, unspecified: Secondary | ICD-10-CM | POA: Diagnosis not present

## 2015-04-13 DIAGNOSIS — E118 Type 2 diabetes mellitus with unspecified complications: Secondary | ICD-10-CM | POA: Diagnosis not present

## 2015-04-14 ENCOUNTER — Telehealth: Payer: Self-pay | Admitting: Physician Assistant

## 2015-04-14 ENCOUNTER — Emergency Department (HOSPITAL_COMMUNITY): Payer: Medicare Other

## 2015-04-14 ENCOUNTER — Emergency Department (HOSPITAL_COMMUNITY)
Admission: EM | Admit: 2015-04-14 | Discharge: 2015-04-14 | Disposition: A | Discharge status: Home / Self Care | Payer: Medicare Other | Attending: Emergency Medicine | Admitting: Emergency Medicine

## 2015-04-14 ENCOUNTER — Encounter (HOSPITAL_COMMUNITY): Payer: Self-pay | Admitting: Emergency Medicine

## 2015-04-14 DIAGNOSIS — I4892 Unspecified atrial flutter: Secondary | ICD-10-CM

## 2015-04-14 DIAGNOSIS — G8929 Other chronic pain: Secondary | ICD-10-CM | POA: Diagnosis not present

## 2015-04-14 DIAGNOSIS — R079 Chest pain, unspecified: Secondary | ICD-10-CM | POA: Diagnosis not present

## 2015-04-14 DIAGNOSIS — Z7982 Long term (current) use of aspirin: Secondary | ICD-10-CM | POA: Diagnosis not present

## 2015-04-14 DIAGNOSIS — I480 Paroxysmal atrial fibrillation: Secondary | ICD-10-CM | POA: Insufficient documentation

## 2015-04-14 DIAGNOSIS — G5602 Carpal tunnel syndrome, left upper limb: Secondary | ICD-10-CM | POA: Diagnosis not present

## 2015-04-14 DIAGNOSIS — Z79899 Other long term (current) drug therapy: Secondary | ICD-10-CM | POA: Insufficient documentation

## 2015-04-14 DIAGNOSIS — Z86711 Personal history of pulmonary embolism: Secondary | ICD-10-CM | POA: Diagnosis not present

## 2015-04-14 DIAGNOSIS — G603 Idiopathic progressive neuropathy: Secondary | ICD-10-CM | POA: Diagnosis not present

## 2015-04-14 DIAGNOSIS — Z86718 Personal history of other venous thrombosis and embolism: Secondary | ICD-10-CM | POA: Insufficient documentation

## 2015-04-14 DIAGNOSIS — R011 Cardiac murmur, unspecified: Secondary | ICD-10-CM | POA: Insufficient documentation

## 2015-04-14 DIAGNOSIS — R0789 Other chest pain: Secondary | ICD-10-CM | POA: Insufficient documentation

## 2015-04-14 DIAGNOSIS — Z7951 Long term (current) use of inhaled steroids: Secondary | ICD-10-CM | POA: Insufficient documentation

## 2015-04-14 DIAGNOSIS — G5601 Carpal tunnel syndrome, right upper limb: Secondary | ICD-10-CM | POA: Diagnosis not present

## 2015-04-14 DIAGNOSIS — Z880 Allergy status to penicillin: Secondary | ICD-10-CM | POA: Diagnosis not present

## 2015-04-14 DIAGNOSIS — E119 Type 2 diabetes mellitus without complications: Secondary | ICD-10-CM | POA: Insufficient documentation

## 2015-04-14 DIAGNOSIS — I10 Essential (primary) hypertension: Secondary | ICD-10-CM | POA: Diagnosis not present

## 2015-04-14 DIAGNOSIS — M5417 Radiculopathy, lumbosacral region: Secondary | ICD-10-CM | POA: Diagnosis not present

## 2015-04-14 DIAGNOSIS — J45901 Unspecified asthma with (acute) exacerbation: Secondary | ICD-10-CM | POA: Insufficient documentation

## 2015-04-14 DIAGNOSIS — R0602 Shortness of breath: Secondary | ICD-10-CM | POA: Diagnosis not present

## 2015-04-14 LAB — CBC WITH DIFFERENTIAL/PLATELET
Basophils Absolute: 0 10*3/uL (ref 0.0–0.1)
Basophils Relative: 0 %
Eosinophils Absolute: 0.2 10*3/uL (ref 0.0–0.7)
Eosinophils Relative: 2 %
HCT: 38.5 % (ref 36.0–46.0)
Hemoglobin: 12.8 g/dL (ref 12.0–15.0)
Lymphocytes Relative: 39 %
Lymphs Abs: 3.7 10*3/uL (ref 0.7–4.0)
MCH: 25.5 pg — ABNORMAL LOW (ref 26.0–34.0)
MCHC: 33.2 g/dL (ref 30.0–36.0)
MCV: 76.8 fL — ABNORMAL LOW (ref 78.0–100.0)
Monocytes Absolute: 0.8 10*3/uL (ref 0.1–1.0)
Monocytes Relative: 9 %
Neutro Abs: 4.8 10*3/uL (ref 1.7–7.7)
Neutrophils Relative %: 50 %
Platelets: 297 10*3/uL (ref 150–400)
RBC: 5.01 MIL/uL (ref 3.87–5.11)
RDW: 14.8 % (ref 11.5–15.5)
WBC: 9.5 10*3/uL (ref 4.0–10.5)

## 2015-04-14 LAB — I-STAT CHEM 8, ED
BUN: 11 mg/dL (ref 6–20)
Calcium, Ion: 1.15 mmol/L (ref 1.13–1.30)
Chloride: 104 mmol/L (ref 101–111)
Creatinine, Ser: 1 mg/dL (ref 0.44–1.00)
Glucose, Bld: 111 mg/dL — ABNORMAL HIGH (ref 65–99)
HCT: 36 % (ref 36.0–46.0)
Hemoglobin: 12.2 g/dL (ref 12.0–15.0)
Potassium: 4.5 mmol/L (ref 3.5–5.1)
Sodium: 141 mmol/L (ref 135–145)
TCO2: 24 mmol/L (ref 0–100)

## 2015-04-14 LAB — COMPREHENSIVE METABOLIC PANEL
ALT: 13 U/L — ABNORMAL LOW (ref 14–54)
AST: 25 U/L (ref 15–41)
Albumin: 3.8 g/dL (ref 3.5–5.0)
Alkaline Phosphatase: 78 U/L (ref 38–126)
Anion gap: 11 (ref 5–15)
BUN: 11 mg/dL (ref 6–20)
CO2: 21 mmol/L — ABNORMAL LOW (ref 22–32)
Calcium: 9.2 mg/dL (ref 8.9–10.3)
Chloride: 104 mmol/L (ref 101–111)
Creatinine, Ser: 1.02 mg/dL — ABNORMAL HIGH (ref 0.44–1.00)
GFR calc Af Amer: >60 mL/min (ref >60)
GFR calc non Af Amer: 55 mL/min — ABNORMAL LOW (ref >60)
Glucose, Bld: 121 mg/dL — ABNORMAL HIGH (ref 65–99)
Potassium: 4.1 mmol/L (ref 3.5–5.1)
Sodium: 136 mmol/L (ref 135–145)
Total Bilirubin: 0.3 mg/dL (ref 0.3–1.2)
Total Protein: 6.7 g/dL (ref 6.5–8.1)

## 2015-04-14 LAB — I-STAT TROPONIN, ED
Troponin i, poc: 0 ng/mL (ref 0.00–0.08)
Troponin i, poc: 0 ng/mL (ref 0.00–0.08)

## 2015-04-14 MED ORDER — DILTIAZEM LOAD VIA INFUSION
10.0000 mg | Freq: Once | INTRAVENOUS | Status: AC
  Administered 2015-04-14: 10 mg via INTRAVENOUS
  Filled 2015-04-14: qty 10

## 2015-04-14 MED ORDER — XARELTO VTE STARTER PACK 15 & 20 MG PO TBPK: 15.0000 mg | ORAL_TABLET | ORAL | Status: DC

## 2015-04-14 MED ORDER — DILTIAZEM HCL 25 MG/5ML IV SOLN
10.0000 mg | Freq: Once | INTRAVENOUS | Status: AC
  Administered 2015-04-14: 10 mg via INTRAVENOUS
  Filled 2015-04-14: qty 5

## 2015-04-14 MED ORDER — DILTIAZEM HCL 100 MG IV SOLR
5.0000 mg/h | INTRAVENOUS | Status: DC
  Administered 2015-04-14: 5 mg/h via INTRAVENOUS
  Filled 2015-04-14: qty 100

## 2015-04-14 MED ORDER — DILTIAZEM HCL ER COATED BEADS 120 MG PO CP24: 120.0000 mg | ORAL_CAPSULE | Freq: Every day | ORAL | Status: DC

## 2015-04-14 MED ORDER — IOHEXOL 350 MG/ML SOLN
100.0000 mL | Freq: Once | INTRAVENOUS | Status: AC | PRN
  Administered 2015-04-14: 100 mL via INTRAVENOUS

## 2015-04-14 NOTE — ED Notes (Signed)
Patient Comes from home states she had a sudden onset of Chest Tightness. Patient states pain was on the left chest radiating to the right arm. On arrival with EMS patient was in Afib RVR with a rate of 150's patient does not have any Hx of it. EMS states vagal and trigger an Ashma attack. Patient received 1 nitro and is pain free and 5mg  of albuterol.

## 2015-04-14 NOTE — ED Notes (Signed)
MD at bedside. 

## 2015-04-14 NOTE — ED Provider Notes (Signed)
CSN: [id number]     Arrival date & time 04/14/15  1654 History   First MD Initiated Contact with Patient 04/14/15 1700     Chief Complaint  Patient presents with  . Chest Pain  . Atrial Fibrillation     (Consider location/radiation/quality/duration/timing/severity/associated sxs/prior Treatment) The history is provided by the patient.  [name] is a 69 y.o. female hx of HTN, DVT and PE off anticoagulation, DM, here presenting with shortness of breath, palpitations. Started acutely today while she was driving. It got worse after she was eating something at [employer]. States that she has some chest pressure as well as severe palpitations. Also has some trouble breathing as well. She called EMS who noticed that she was in rapid A. Fib. EMS had her try vagal maneuvers and then she started wheezing. She is no history of A. fib but says that she is not on any anticoagulation. She did have some foot surgery done several months ago.   Past Medical History  Diagnosis Date  . Hypertension   . DVT (deep venous thrombosis)   . Pulmonary embolism   . Diabetes mellitus   . Seizures   . Allergic rhinitis   . Chronic headache   . Pulmonary hypertension   . Dyspnea   . Asthma   . Arthritis   . Allergy   . Heart murmur    Past Surgical History  Procedure Laterality Date  . Knee surgery    . Plantar fascia surgery    . Partial hysterectomy    . Tonsillectomy    . Cardiac catheterization  12/28/2010    Mod. pulmonary hypertension, normal coronary arteries  . Doppler echocardiography  10/08/2011    EF=>55%,mild asymmetric LVH, mod. TR, mod. PH, mild to mod LA dilatation  . Nuclear stress test  05/20/2006    No ischemia  . Abdominal hysterectomy      partial, has ovaries  . Knee arthroscopy Left    Family History  Problem Relation Age of Onset  . Hypertension Mother   . Clotting disorder Mother   . Breast cancer Mother   . Arthritis Mother   . Stroke Mother   . Diabetes Mother    . Allergies Other     grandson  . Cancer Brother   . Alcohol abuse Father   . Arthritis Sister   . Diabetes Sister   . Multiple sclerosis Sister    Social History  Substance Use Topics  . Smoking status: Never Smoker   . Smokeless tobacco: Never Used  . Alcohol Use: 0.0 oz/week    0 Standard drinks or equivalent per week     Comment: occ glass on wine   OB History    No data available     Review of Systems  Respiratory: Positive for shortness of breath.   Cardiovascular: Positive for chest pain and palpitations.  All other systems reviewed and are negative.     Allergies  Lyrica; Penicillins; Phenytoin sodium extended; and Vicodin  Home Medications   Prior to Admission medications   Medication Sig Start Date End Date Taking? Authorizing Provider  acetaminophen (TYLENOL) 325 MG tablet Take 650 mg by mouth every 6 (six) hours as needed for moderate pain (pain). pain   Yes Historical Provider, MD  albuterol (PROAIR HFA) 108 (90 BASE) MCG/ACT inhaler Inhale 2 puffs into the lungs every 6 (six) hours as needed for wheezing (wheezing). Shortness of breath   Yes Historical Provider, MD  albuterol (PROVENTIL) (2.5 MG/3ML)  0.083% nebulizer solution Take 2.5 mg by nebulization every 4 (four) hours as needed for wheezing or shortness of breath.   Yes Historical Provider, MD  amLODipine (NORVASC) 5 MG tablet Take 1 tablet (5 mg total) by mouth daily. 04/05/15  Yes Liliane Shi, PA-C  aspirin 81 MG tablet Take 81 mg by mouth daily.    Yes Historical Provider, MD  Azelastine-Fluticasone (DYMISTA) 137-50 MCG/ACT SUSP Place 1 puff into the nose every other day.    Yes Historical Provider, MD  beclomethasone (QVAR) 80 MCG/ACT inhaler Inhale 2 puffs into the lungs 2 (two) times daily.     Yes Historical Provider, MD  Biotin 1 MG CAPS Take 1 mg by mouth as needed (muscles).    Yes Historical Provider, MD  cyclobenzaprine (FLEXERIL) 10 MG tablet Take 10 mg by mouth daily as needed for  muscle spasms.    Yes Historical Provider, MD  DiphenhydrAMINE HCl (BENADRYL PO) Take 1 tablet by mouth daily as needed (itching).    Yes Historical Provider, MD  DULoxetine (CYMBALTA) 30 MG capsule Take 30 mg by mouth daily.   Yes Historical Provider, MD  EPINEPHrine (EPIPEN 2-PAK) 0.3 mg/0.3 mL IJ SOAJ injection Inject 0.3 mg into the muscle once.   Yes Historical Provider, MD  fluticasone-salmeterol (ADVAIR HFA) 230-21 MCG/ACT inhaler Inhale 2 puffs into the lungs 2 (two) times daily.   Yes Historical Provider, MD  furosemide (LASIX) 40 MG tablet Take 1 tablet (40 mg total) by mouth daily. 01/27/15  Yes Mihai Croitoru, MD  gabapentin (NEURONTIN) 100 MG capsule Take 100 mg by mouth as needed (nerves).    Yes Historical Provider, MD  Lactase (DAIRY-RELIEF PO) Take 1 capsule by mouth daily as needed (stomach upset).    Yes Historical Provider, MD  Magnesium 250 MG TABS Take 1 tablet by mouth daily.   Yes Historical Provider, MD  meloxicam (MOBIC) 15 MG tablet Take 15 mg by mouth daily.   Yes Historical Provider, MD  metFORMIN (GLUCOPHAGE) 500 MG tablet TAKE 1 TABLET (500 MG TOTAL) BY MOUTH 2 (TWO) TIMES DAILY WITH A MEAL. 03/28/15  Yes Amy E Bedsole, MD  montelukast (SINGULAIR) 10 MG tablet Take 10 mg by mouth daily.     Yes Historical Provider, MD  Olopatadine HCl (PATADAY) 0.2 % SOLN Apply 1 drop to eye daily as needed (dry eyes).    Yes Historical Provider, MD  ondansetron (ZOFRAN ODT) 4 MG disintegrating tablet Take 1 tablet (4 mg total) by mouth every 8 (eight) hours as needed for nausea or vomiting. 05/19/14  Yes Kaitlyn Szekalski, PA-C  pantoprazole (PROTONIX) 40 MG tablet Take 40 mg by mouth daily.   Yes Historical Provider, MD  Potassium Chloride CR (MICRO-K) 8 MEQ CPCR capsule CR Take 8 mEq by mouth daily.    Yes Historical Provider, MD  Probiotic Product (ALIGN) 4 MG CAPS Take 1 capsule by mouth daily.   Yes Historical Provider, MD  ranitidine (ZANTAC) 300 MG capsule Take 300 mg by mouth  every evening.   Yes Historical Provider, MD  sodium chloride (OCEAN) 0.65 % SOLN nasal spray Place 2 sprays into both nostrils daily as needed for congestion (congestion).    Yes Historical Provider, MD  traMADol (ULTRAM) 50 MG tablet Take by mouth every 6 (six) hours as needed for severe pain.   Yes Historical Provider, MD  valsartan (DIOVAN) 160 MG tablet Take 1 tablet (160 mg total) by mouth daily. 01/05/15  Yes Sanda Klein, MD  Ginkgo Biloba  40 MG TABS Take 1 tablet by mouth 3 (three) times daily.    Historical Provider, MD  Omega 3-6-9 Fatty Acids (OMEGA 3-6-9 COMPLEX PO) Take 2 capsules by mouth daily.    Historical Provider, MD  [facility] DELICA LANCETS 54M MISC as needed (blood check).  11/03/14   Historical Provider, MD  [name] VERIO test strip 1 each by Other route as needed (blood monitoring).  11/03/14   Historical Provider, MD  SUMAtriptan (IMITREX) 100 MG tablet Take 100 mg x 1 may, repeat in 2 hour for migraine. Patient not taking: Reported on 04/14/2015 08/24/14   [name], MD  traMADol (ULTRAM) 50 MG tablet Take 1 tablet (50 mg total) by mouth every 6 (six) hours as needed. Patient not taking: Reported on 04/14/2015 05/14/14   Billy Fischer, MD   BP 123/70 mmHg  Pulse 78  Temp(Src) 97.9 F (36.6 C) (Oral)  Resp 31  SpO2 100% Physical Exam  Constitutional: She is oriented to person, place, and time.  Uncomfortable   HENT:  Head: Normocephalic.  Mouth/Throat: Oropharynx is clear and moist.  Eyes: Conjunctivae are normal. Pupils are equal, round, and reactive to light.  Neck: Normal range of motion. Neck supple.  Cardiovascular:  Rapid, irregular   Pulmonary/Chest:  Minimal wheezing, no retractions   Abdominal: Soft. Bowel sounds are normal. She exhibits no distension. There is no tenderness. There is no rebound.  Musculoskeletal: Normal range of motion. She exhibits no edema or tenderness.  Neurological: She is alert and oriented to person, place, and time.  Skin:  Skin is warm and dry.  Psychiatric: She has a normal mood and affect. Her behavior is normal. Judgment and thought content normal.  Nursing note and vitals reviewed.   ED Course  Procedures (including critical care time)  CRITICAL CARE Performed by: Darl Householder, DAVID   Total critical care time: 30 min   Critical care time was exclusive of separately billable procedures and treating other patients.  Critical care was necessary to treat or prevent imminent or life-threatening deterioration.  Critical care was time spent personally by me on the following activities: development of treatment plan with patient and/or surrogate as well as nursing, discussions with consultants, evaluation of patient's response to treatment, examination of patient, obtaining history from patient or surrogate, ordering and performing treatments and interventions, ordering and review of laboratory studies, ordering and review of radiographic studies, pulse oximetry and re-evaluation of patient's condition.    Labs Review Labs Reviewed  CBC WITH DIFFERENTIAL/PLATELET - Abnormal; Notable for the following:    MCV 76.8 (*)    MCH 25.5 (*)    All other components within normal limits  COMPREHENSIVE METABOLIC PANEL - Abnormal; Notable for the following:    CO2 21 (*)    Glucose, Bld 121 (*)    Creatinine, Ser 1.02 (*)    ALT 13 (*)    GFR calc non Af Amer 55 (*)    All other components within normal limits  I-STAT CHEM 8, ED - Abnormal; Notable for the following:    Glucose, Bld 111 (*)    All other components within normal limits  I-STAT TROPOININ, ED  I-STAT TROPOININ, ED    Imaging Review Ct Angio Chest Pe W/cm &/or Wo Cm  04/14/2015   CLINICAL DATA:  Left-sided chest pain and shortness of Breath  EXAM: CT ANGIOGRAPHY CHEST WITH CONTRAST  TECHNIQUE: Multidetector CT imaging of the chest was performed using the standard protocol during bolus administration of  intravenous contrast. Multiplanar CT image  reconstructions and MIPs were obtained to evaluate the vascular anatomy.  CONTRAST:  156mL OMNIPAQUE IOHEXOL 350 MG/ML SOLN  COMPARISON:  Plain film from earlier in the same day  FINDINGS: The lungs are well aerated bilaterally without focal infiltrate or sizable effusion. Some enlargement of the left lobe of the thyroid is noted with a vague 2.1 cm hypodense area identified.  The thoracic aorta and its branches are within normal limits. Pulmonary artery is well visualized and demonstrates a normal branching pattern. No findings to suggest pulmonary emboli are seen. No significant hilar or mediastinal adenopathy is noted.  The upper abdomen is within normal limits. The osseous structures show degenerative change of the thoracic spine.  Review of the MIP images confirms the above findings.  IMPRESSION: Left thyroid nodule measuring 2.1 cm. Dedicated nonemergent ultrasound is recommended for further evaluation.  No pulmonary emboli are identified.  No acute abnormality noted.   Electronically Signed   By: Inez Catalina M.D.   On: 04/14/2015 20:50   Dg Chest Port 1 View  04/14/2015   CLINICAL DATA:  Shortness of breath and left-sided chest pain for 5 hours  EXAM: PORTABLE CHEST - 1 VIEW  COMPARISON:  01/16/2011  FINDINGS: Cardiac shadow is stable. The lungs are clear bilaterally. No acute bony abnormality is seen.  IMPRESSION: No active disease.   Electronically Signed   By: Inez Catalina M.D.   On: 04/14/2015 17:26   I have personally reviewed and evaluated these images and lab results as part of my medical decision-making.   EKG Interpretation   Date/Time:  Thursday April 14 2015 18:06:53 EDT Ventricular Rate:  62 PR Interval:  144 QRS Duration: 86 QT Interval:  396 QTC Calculation: 402 R Axis:   -3 Text Interpretation:  Sinus rhythm Left ventricular hypertrophy ST  elevation, consider anterior injury J point elevation. Previous EKG  earlier in the day was afib Confirmed by YAO  MD, DAVID ([id number])  on  04/14/2015 6:11:26 PM      MDM   Final diagnoses:  None    [name] is a 69 y.o. female here with shortness of breath, rapid afib. I am concerned for possible PE as well. Will get labs, trop, CT angio. Will give cardizem.   6 pm Given 10 mg cardizem x 2 and then started on the drip. Shortly afterwards, patient spontaneously converted to sinus rhythm. Patient's pain completely resolved. Has some J point elevation that is unchanged from previous. CT angio pending.   9:47 PM CT showed no PE. Delta trop neg. HR remained in sinus. Called Dr. Philbert Riser, cardiologist on call, and discussed patient care with him. He recommend cardizem 120 mg CD daily for rate control. Her CHADVAS score is 4 so he recommend xarelto. She can call office in the morning for follow up.       Wandra Arthurs, MD 04/14/15 2149

## 2015-04-14 NOTE — Telephone Encounter (Signed)
New message      Talk to [facility] about monitoring her heart rate and have some other questions

## 2015-04-14 NOTE — ED Notes (Signed)
Discharge instructions/prescriptions reviewed with patient/family. Understanding verbalized. Denies pain. No distress noted.

## 2015-04-14 NOTE — Telephone Encounter (Signed)
Pt called as she is getting a monitor next week and wanted [place] to know that she was having CP and if we had any suggestions.  Pt stated that she has been having CP since 1pm when she was driving home from her neurologist and it started so she thought it would get better if she got something cold to drink.  She bought herself a frappe which she said made her feel worse.  When she got home she checked her BP is was 147/98 HR 109; and pt stated she laid down and it has gotten better with a pain level of 7-8.  Pt stated she took a Tylenol and has not had any relief.  Pt told to take an ASA 81 and have some water.  She state she took her BP right before we spoke it was 153/93 HR 144.  Pt stated she is having pressure on her chest, and SOB. No radiation to back, jaw, or down her arm.  Pt instructed to call 911 and have them transport her to [facility] ED to be evaluated.  She said this has happened before and she was hospitalized.  Pt agreed to call 911 and no other questions at this time.

## 2015-04-14 NOTE — Discharge Instructions (Signed)
Take xarelto as prescribed.  Take cardizem to control heart rate.   Call cardiology tomorrow morning for appointment.   Return to ER if you have chest pain, palpitations, shortness of breath.

## 2015-04-14 NOTE — ED Notes (Signed)
EDP request to stop medication.

## 2015-04-15 ENCOUNTER — Telehealth: Payer: Self-pay | Admitting: Nurse Practitioner

## 2015-04-15 NOTE — Telephone Encounter (Signed)
New message      Pt request to talk to [facility] about upcoming appointment.  Please call

## 2015-04-19 ENCOUNTER — Encounter: Payer: Self-pay | Admitting: Cardiovascular Disease

## 2015-04-19 ENCOUNTER — Encounter: Payer: Self-pay | Admitting: *Deleted

## 2015-04-19 ENCOUNTER — Ambulatory Visit (INDEPENDENT_AMBULATORY_CARE_PROVIDER_SITE_OTHER): Payer: Medicare Other | Admitting: Allergy and Immunology

## 2015-04-19 ENCOUNTER — Ambulatory Visit (INDEPENDENT_AMBULATORY_CARE_PROVIDER_SITE_OTHER): Payer: Medicare Other | Admitting: Nurse Practitioner

## 2015-04-19 ENCOUNTER — Telehealth: Payer: Self-pay | Admitting: Nurse Practitioner

## 2015-04-19 ENCOUNTER — Ambulatory Visit (INDEPENDENT_AMBULATORY_CARE_PROVIDER_SITE_OTHER): Payer: Medicare Other

## 2015-04-19 ENCOUNTER — Encounter: Payer: Self-pay | Admitting: Nurse Practitioner

## 2015-04-19 ENCOUNTER — Encounter: Payer: Self-pay | Admitting: Allergy and Immunology

## 2015-04-19 VITALS — BP 104/72 | HR 88 | Resp 20

## 2015-04-19 VITALS — BP 122/80 | HR 67 | Ht 65.0 in | Wt 242.8 lb

## 2015-04-19 DIAGNOSIS — I480 Paroxysmal atrial fibrillation: Secondary | ICD-10-CM

## 2015-04-19 DIAGNOSIS — J3089 Other allergic rhinitis: Secondary | ICD-10-CM | POA: Diagnosis not present

## 2015-04-19 DIAGNOSIS — J4540 Moderate persistent asthma, uncomplicated: Secondary | ICD-10-CM

## 2015-04-19 DIAGNOSIS — I10 Essential (primary) hypertension: Secondary | ICD-10-CM | POA: Diagnosis not present

## 2015-04-19 DIAGNOSIS — R0789 Other chest pain: Secondary | ICD-10-CM

## 2015-04-19 DIAGNOSIS — K219 Gastro-esophageal reflux disease without esophagitis: Secondary | ICD-10-CM | POA: Diagnosis not present

## 2015-04-19 MED ORDER — FLUTICASONE PROPIONATE HFA 220 MCG/ACT IN AERO: 2.0000 | INHALATION_SPRAY | Freq: Two times a day (BID) | RESPIRATORY_TRACT | Status: DC

## 2015-04-19 MED ORDER — RIVAROXABAN 20 MG PO TABS: 20.0000 mg | ORAL_TABLET | Freq: Every day | ORAL | Status: DC

## 2015-04-19 MED ORDER — PANTOPRAZOLE SODIUM 40 MG PO TBEC: 40.0000 mg | DELAYED_RELEASE_TABLET | Freq: Every day | ORAL | Status: DC

## 2015-04-19 NOTE — Patient Instructions (Addendum)
We will be checking the following labs today - NONE   Medication Instructions:    Continue with your current medicines. BUT  I am stopping Amlodipine  I am putting you just on Xarelto 20 mg to take with your largest meal of the day - I have sent this to your pharmacy  STOP your aspirin    Testing/Procedures To Be Arranged:  Monitor to be placed today  Echocardiogram - try to do later this week  Follow-Up:   See Dr. Sallyanne Kuster as planned in November    Other Special Instructions:   N/A  Call the [place] office at [phone] if you have any questions, problems or concerns.

## 2015-04-19 NOTE — Progress Notes (Signed)
CARDIOLOGY OFFICE NOTE  Date:  04/19/2015    [name] Date of Birth: 1946-02-01 Medical Record #[id number]  PCP:  [name], MD  Cardiologist:  Croitoru    Chief Complaint  Patient presents with  . Chest Pain    Post ER visit - seen for Dr. Sallyanne Kuster  . Irregular Heart Beat  . Atrial Fibrillation    History of Present Illness: [name] is a 69 y.o. female who presents today for a post ER visit. Seen for Dr. Sallyanne Kuster. She has a history of HTN, pulmonary HTN, hypercoagulable state, prior PE, prior DVT, OA, DM, GERD, neuropathy and PAF.   Echocardiogram in 2014 showed an estimated systolic PA pressure 48 mmHg. She does not have a history of chronic lung disease, congestive heart failure, obstructive sleep apnea or collagen vascular disease. The presumed cause of her pulmonary hypertension is previous venous thrombolic embolic events. She had pulmonary embolism in 2004 following 2 consecutive foot surgeries. She was treated with Coumadin for a year. Hypercoag panel was neg except for + Lupus anticoagulant. [place] in 2012 demonstrated RA 16,RV 54/15,PA 50/20, PCWP 16-20, CO 4.8, mean PA 30, and SVR calculated to 2.5 wood units. During that hospitalization off coumadin her ESR was normal, protein C normall, protein S level normal but activity decreased?, neg anticardiolipin Ab, and lupus anticoagulant was not detected. She has had a CT chest which showed no parenchymal lung disease or other abnormality. Reportedly she had a normal sleep study in the past.  Was here last month and saw Richardson Dopp, [place] for a follow up visit for her BP.   In the ER last week with chest pain - found to be in AF. Treated with Diltiazem and converted spontaneously. Xarelto was started for [facility] of 5 with a 7.2% annual stroke however she was placed on a starter pack with 15 mg BID and then to go to 20 mg after 21 days. CT chest without PE noted. Troponin negative.   Comes in today. Here  alone. She is feeling better. No more chest pain. No real sensation of palpitations. Tolerating her medicines. Remains on Norvasc as well as Diltiazem. Not short of breath. No falls. Labs were ok from the ER.   Past Medical History  Diagnosis Date  . Hypertension   . DVT (deep venous thrombosis) ([place])   . Pulmonary embolism ([place])   . Diabetes mellitus   . Seizures ([place])   . Allergic rhinitis   . Chronic headache   . Pulmonary hypertension ([place])   . Dyspnea   . Asthma   . Arthritis   . Allergy   . Heart murmur     Past Surgical History  Procedure Laterality Date  . Knee surgery    . Plantar fascia surgery    . Partial hysterectomy    . Tonsillectomy    . Cardiac catheterization  12/28/2010    Mod. pulmonary hypertension, normal coronary arteries  . Doppler echocardiography  10/08/2011    EF=>55%,mild asymmetric LVH, mod. TR, mod. PH, mild to mod LA dilatation  . Nuclear stress test  05/20/2006    No ischemia  . Abdominal hysterectomy      partial, has ovaries  . Knee arthroscopy Left      Medications: Current Outpatient Prescriptions  Medication Sig Dispense Refill  . acetaminophen (TYLENOL) 325 MG tablet Take 650 mg by mouth every 6 (six) hours as needed for moderate pain (pain). pain    . albuterol (  PROAIR HFA) 108 (90 BASE) MCG/ACT inhaler Inhale 2 puffs into the lungs every 6 (six) hours as needed for wheezing (wheezing). Shortness of breath    . albuterol (PROVENTIL) (2.5 MG/3ML) 0.083% nebulizer solution Take 2.5 mg by nebulization every 4 (four) hours as needed for wheezing or shortness of breath.    . Azelastine-Fluticasone (DYMISTA) 137-50 MCG/ACT SUSP Place 1 puff into the nose every other day.     . beclomethasone (QVAR) 80 MCG/ACT inhaler Inhale 2 puffs into the lungs 2 (two) times daily.      . Biotin 1 MG CAPS Take 1 mg by mouth as needed (muscles).     . cyclobenzaprine (FLEXERIL) 10 MG tablet Take 10 mg by mouth daily as needed for muscle spasms.     Marland Kitchen  diltiazem (CARDIZEM CD) 120 MG 24 hr capsule Take 1 capsule (120 mg total) by mouth daily. 30 capsule 0  . DiphenhydrAMINE HCl (BENADRYL PO) Take 1 tablet by mouth daily as needed (itching).     . DULoxetine (CYMBALTA) 30 MG capsule Take 30 mg by mouth daily.    Marland Kitchen EPINEPHrine (EPIPEN 2-PAK) 0.3 mg/0.3 mL IJ SOAJ injection Inject 0.3 mg into the muscle as needed (ALLERGIC REACTION).     . fluticasone-salmeterol (ADVAIR HFA) 230-21 MCG/ACT inhaler Inhale 2 puffs into the lungs 2 (two) times daily.    . furosemide (LASIX) 40 MG tablet Take 1 tablet (40 mg total) by mouth daily. 30 tablet 3  . gabapentin (NEURONTIN) 100 MG capsule Take 100 mg by mouth as needed (nerves).     . Ginkgo Biloba 40 MG TABS Take 1 tablet by mouth 3 (three) times daily.    . Lactase (DAIRY-RELIEF PO) Take 1 capsule by mouth daily as needed (stomach upset).     . Magnesium 250 MG TABS Take 1 tablet by mouth daily.    . meloxicam (MOBIC) 15 MG tablet Take 15 mg by mouth daily.    . metFORMIN (GLUCOPHAGE) 500 MG tablet TAKE 1 TABLET (500 MG TOTAL) BY MOUTH 2 (TWO) TIMES DAILY WITH A MEAL. 60 tablet 2  . montelukast (SINGULAIR) 10 MG tablet Take 10 mg by mouth daily.      . Olopatadine HCl (PATADAY) 0.2 % SOLN Apply 1 drop to eye daily as needed (dry eyes).     . Omega 3-6-9 Fatty Acids (OMEGA 3-6-9 COMPLEX PO) Take 2 capsules by mouth daily.    . ondansetron (ZOFRAN ODT) 4 MG disintegrating tablet Take 1 tablet (4 mg total) by mouth every 8 (eight) hours as needed for nausea or vomiting. 10 tablet 0  . ONETOUCH DELICA LANCETS 16X MISC as needed (blood check).     [name] VERIO test strip 1 each by Other route as needed (blood monitoring).     . pantoprazole (PROTONIX) 40 MG tablet Take 40 mg by mouth daily.    . Potassium Chloride CR (MICRO-K) 8 MEQ CPCR capsule CR Take 8 mEq by mouth daily.     . Probiotic Product (ALIGN) 4 MG CAPS Take 1 capsule by mouth daily.    . ranitidine (ZANTAC) 300 MG capsule Take 300 mg by mouth  every evening.    . sodium chloride (OCEAN) 0.65 % SOLN nasal spray Place 2 sprays into both nostrils daily as needed for congestion (congestion).     . SUMAtriptan (IMITREX) 100 MG tablet Take 100 mg x 1 may, repeat in 2 hour for migraine. 10 tablet 0  . SUMAtriptan (IMITREX) 100 MG tablet  Take 100 mg by mouth as needed for migraine. May repeat in 2 hours if headache persists or recurs.    . traMADol (ULTRAM) 50 MG tablet Take by mouth every 6 (six) hours as needed for severe pain.    . valsartan (DIOVAN) 160 MG tablet Take 1 tablet (160 mg total) by mouth daily. 30 tablet 6  . rivaroxaban (XARELTO) 20 MG TABS tablet Take 1 tablet (20 mg total) by mouth daily with supper. 30 tablet 6   Current Facility-Administered Medications  Medication Dose Route Frequency Provider Last Rate Last Dose  . omalizumab Arvid Right) injection 300 mg  300 mg Subcutaneous Q28 days [name], MD        Allergies: Allergies  Allergen Reactions  . Lyrica [Pregabalin] Other (See Comments)    Blurred vision.  Marland Kitchen Penicillins     Has patient had a PCN reaction causing immediate rash, facial/tongue/throat swelling, SOB or lightheadedness with hypotension patient had a PCN reaction causing severe rash involving mucus membranes or skin necrosis: [id number]} Has patient had a PCN reaction that required hospitalization/No Has patient had a PCN reaction occurring within the last 10 years: No If all of the above answers are "NO", then may proceed with Cephalosporin use.     Marland Kitchen Phenytoin Sodium Extended     Swelling   . Vicodin [Hydrocodone-Acetaminophen] Nausea And Vomiting    Social History: The patient  reports that she has never smoked. She has never used smokeless tobacco. She reports that she drinks alcohol. She reports that she does not use illicit drugs.   Family History: The patient's family history includes Alcohol abuse in her father; Allergies in her other; Arthritis in her mother and sister; Breast  cancer in her mother; Cancer in her brother; Clotting disorder in her mother; Diabetes in her mother and sister; Hypertension in her mother; Multiple sclerosis in her sister; Stroke in her mother.   Review of Systems: Please see the history of present illness.   Otherwise, the review of systems is positive for chest pain, wheezing, anxiety and headaches.   All other systems are reviewed and negative.   Physical Exam: VS:  BP 122/80 mmHg  Pulse 67  Ht '5\' 5"'  (1.651 m)  Wt 242 lb 12.8 oz (110.133 kg)  BMI 40.40 kg/m2 .  BMI Body mass index is 40.4 kg/(m^2).  Wt Readings from Last 3 Encounters:  04/19/15 242 lb 12.8 oz (110.133 kg)  04/05/15 236 lb (107.049 kg)  01/11/15 240 lb 8 oz (109.09 kg)    General: Pleasant. She is obese but in no acute distress.  HEENT: Normal. Neck: Supple, no JVD, carotid bruits, or masses noted.  Cardiac: Regular rate and rhythm. Soft outflow murmur.  No edema.  Respiratory:  Lungs are clear to auscultation bilaterally with normal work of breathing.  GI: Soft and nontender.  MS: No deformity or atrophy. Gait and ROM intact. Skin: Warm and dry. Color is normal.  Neuro:  Strength and sensation are intact and no gross focal deficits noted.  Psych: Alert, appropriate and with normal affect.   LABORATORY DATA:  EKG:  EKG is ordered today. This demonstrates NSR with PACs.   Lab Results  Component Value Date   WBC 9.5 04/14/2015   HGB 12.2 04/14/2015   HCT 36.0 04/14/2015   PLT 297 04/14/2015   GLUCOSE 111* 04/14/2015   CHOL 151 10/19/2014   TRIG 70.0 10/19/2014   HDL 60.90 10/19/2014   LDLCALC 76 10/19/2014   ALT 13*  04/14/2015   AST 25 04/14/2015   NA 141 04/14/2015   K 4.5 04/14/2015   CL 104 04/14/2015   CREATININE 1.00 04/14/2015   BUN 11 04/14/2015   CO2 21* 04/14/2015   TSH 0.59 04/05/2015   INR 1.17 12/28/2010   HGBA1C 6.2 10/19/2014    BNP (last 3 results) No results for input(s): BNP in the last 8760 hours.  ProBNP (last 3  results) No results for input(s): PROBNP in the last 8760 hours.   Other Studies Reviewed Today: Echo 11/20/12 Mild to mod LVH, vigorous LVF, EF 70-75%, Gr 1 DD, MAC, mid late MVP involving anterior leaflet, mild BAE, PASP 48 mmHg (mod to severe pulmonary HTN)  Myoview 11/07 Low risk, no ischemia, EF 74%  R/L Heart Cath 6/12 IMPRESSION: 1. Normal hyperdynamic left ventricular function. 2. Elevation of right heart pressures with moderate pulmonary  hypertension with right ventricle systolic and PA pressures ranging  now at 95-18 mm systolically. 3. Normal coronary arteries. 4. No evidence for significant aortoiliac disease or renovascular  hypertension  Assessment/Plan:  1. Chest pain - normal coronaries by cath from 2012 - most likely chest pain from her atrial fib with RVR - now resolved and has not recurred. Would favor monitoring for now.   2. PAF - on low dose CCB therapy - needs echo updated. For Holter later today. I have stopped her Norvasc. She remains in NSR.  3. HTN with past multiple medicine intolerances. BP currently ok on her current regimen. Stopping Norvasc since she is on Diltiazem now.   4. Pulmonary HTN - needs echo updated.   Current medicines are reviewed with the patient today.  The patient does not have concerns regarding medicines other than what has been noted above.  The following changes have been made:  See above.  Labs/ tests ordered today include:    Orders Placed This Encounter  Procedures  . EKG 12-Lead  . ECHOCARDIOGRAM COMPLETE     Disposition:   FU with Dr. Sallyanne Kuster as planned in November. I do not think she needs to [place] next week. Will get her echo updated and monitor is being placed today.   Patient is agreeable to this plan and will call if any problems develop in the interim.   Signed: Burtis Junes, RN, ANP-C 04/19/2015 9:13 AM  [facility] [address] [place],    [address] Phone: [phone] Fax: [phone]

## 2015-04-19 NOTE — Telephone Encounter (Signed)
New Message     Pt wants to talk to Cecille Rubin about her monitor on her arm that she has to wear for 24 hrs. Please call back and advise.

## 2015-04-19 NOTE — Progress Notes (Signed)
[facility]  Follow-up Note  [name] is a 69 y.o. female who returns to the [place] in re-evaluation of the following:  HPI Comments: . 1. Moderate persistent asthma, uncomplicated [name] has not had any problem with her asthma since her last visit in July. She has not had any exacerbations, nor has she had a SABA requirement exceeding twice a week. She is limited in her ability to exercise secondary to musculoskeletal issue and thus difficult to assess for EIA. Anari recently develop atrial fibrillation and is no back on a anticoagulant with Xarelto and also using diltiazem for rate control. It should be noted that her asthma medication plan includes Advair in addition to Qvar and Xolair and thus she is exposed to a LABA on a daily basis. She did obtain the flu vaccine last week.    2. Other allergic rhinitis Fortunately, [name] has not been having any problem with her nose while using Dymista a few times per week. No episodes of sinusitis.   3. Gastroesophageal reflux disease, esophagitis presence not specified She continues on a PPI + H2RB treatment which is working well for her reflux and her laryngopharyngeal reflux disease.      Current outpatient prescriptions:  .  acetaminophen (TYLENOL) 325 MG tablet, Take 650 mg by mouth every 6 (six) hours as needed for moderate pain (pain). pain, Disp: , Rfl:  .  albuterol (PROVENTIL) (2.5 MG/3ML) 0.083% nebulizer solution, Take 2.5 mg by nebulization every 4 (four) hours as needed for wheezing or shortness of breath., Disp: , Rfl:  .  Azelastine-Fluticasone (DYMISTA) 137-50 MCG/ACT SUSP, Place 1 puff into the nose every other day. , Disp: , Rfl:  .  beclomethasone (QVAR) 80 MCG/ACT inhaler, Inhale 2 puffs into the lungs 2 (two) times daily.  , Disp: , Rfl:  .  Biotin 1 MG CAPS, Take 1 mg by mouth as needed (muscles). , Disp: , Rfl:  .   cyclobenzaprine (FLEXERIL) 10 MG tablet, Take 10 mg by mouth daily as needed for muscle spasms. , Disp: , Rfl:  .  diltiazem (CARDIZEM CD) 120 MG 24 hr capsule, Take 1 capsule (120 mg total) by mouth daily., Disp: 30 capsule, Rfl: 0 .  DiphenhydrAMINE HCl (BENADRYL PO), Take 1 tablet by mouth daily as needed (itching). , Disp: , Rfl:  .  DULoxetine (CYMBALTA) 30 MG capsule, Take 30 mg by mouth daily., Disp: , Rfl:  .  EPINEPHrine (EPIPEN 2-PAK) 0.3 mg/0.3 mL IJ SOAJ injection, Inject 0.3 mg into the muscle as needed (ALLERGIC REACTION). , Disp: , Rfl:  .  fluticasone-salmeterol (ADVAIR HFA) 230-21 MCG/ACT inhaler, Inhale 2 puffs into the lungs 2 (two) times daily., Disp: , Rfl:  .  furosemide (LASIX) 40 MG tablet, Take 1 tablet (40 mg total) by mouth daily., Disp: 30 tablet, Rfl: 3 .  gabapentin (NEURONTIN) 100 MG capsule, Take 100 mg by mouth as needed (nerves). , Disp: , Rfl:  .  Lactase (DAIRY-RELIEF PO), Take 1 capsule by mouth daily as needed (stomach upset). , Disp: , Rfl:  .  Magnesium 250 MG TABS, Take 1 tablet by mouth daily., Disp: , Rfl:  .  meloxicam (MOBIC) 15 MG tablet, Take 15 mg by mouth daily., Disp: , Rfl:  .  metFORMIN (GLUCOPHAGE) 500 MG tablet, TAKE 1 TABLET (500 MG TOTAL) BY MOUTH 2 (TWO) TIMES DAILY WITH A MEAL., Disp: 60 tablet, Rfl: 2 .  montelukast (SINGULAIR) 10 MG tablet, Take 10 mg by mouth daily.  , Disp: , Rfl:  .  Olopatadine HCl (PATADAY) 0.2 % SOLN, Apply 1 drop to eye daily as needed (dry eyes). , Disp: , Rfl:  .  Omega 3-6-9 Fatty Acids (OMEGA 3-6-9 COMPLEX PO), Take 2 capsules by mouth daily., Disp: , Rfl:  .  ondansetron (ZOFRAN ODT) 4 MG disintegrating tablet, Take 1 tablet (4 mg total) by mouth every 8 (eight) hours as needed for nausea or vomiting., Disp: 10 tablet, Rfl: 0 .  ONETOUCH DELICA LANCETS 34K MISC, as needed (blood check). , Disp: , Rfl:  .  ONETOUCH VERIO test strip, 1 each by Other route as needed (blood monitoring). , Disp: , Rfl:  .  Potassium  Chloride CR (MICRO-K) 8 MEQ CPCR capsule CR, Take 8 mEq by mouth daily. , Disp: , Rfl:  .  Probiotic Product (ALIGN) 4 MG CAPS, Take 1 capsule by mouth daily., Disp: , Rfl:  .  ranitidine (ZANTAC) 300 MG capsule, Take 300 mg by mouth every evening., Disp: , Rfl:  .  rivaroxaban (XARELTO) 20 MG TABS tablet, Take 1 tablet (20 mg total) by mouth daily with supper., Disp: 30 tablet, Rfl: 6 .  sodium chloride (OCEAN) 0.65 % SOLN nasal spray, Place 2 sprays into both nostrils daily as needed for congestion (congestion). , Disp: , Rfl:  .  SUMAtriptan (IMITREX) 100 MG tablet, Take 100 mg x 1 may, repeat in 2 hour for migraine., Disp: 10 tablet, Rfl: 0 .  traMADol (ULTRAM) 50 MG tablet, Take by mouth every 6 (six) hours as needed for severe pain., Disp: , Rfl:  .  valsartan (DIOVAN) 160 MG tablet, Take 1 tablet (160 mg total) by mouth daily., Disp: 30 tablet, Rfl: 6 .  albuterol (PROAIR HFA) 108 (90 BASE) MCG/ACT inhaler, Inhale 2 puffs into the lungs every 6 (six) hours as needed for wheezing (wheezing). Shortness of breath, Disp: , Rfl:  .  fluticasone (FLOVENT HFA) 220 MCG/ACT inhaler, Inhale 2 puffs into the lungs 2 (two) times daily., Disp: 1 Inhaler, Rfl: 5 .  Ginkgo Biloba 40 MG TABS, Take 1 tablet by mouth 3 (three) times daily., Disp: , Rfl:  .  pantoprazole (PROTONIX) 40 MG tablet, Take 1 tablet (40 mg total) by mouth daily., Disp: 30 tablet, Rfl: 5 .  SUMAtriptan (IMITREX) 100 MG tablet, Take 100 mg by mouth as needed for migraine. May repeat in 2 hours if headache persists or recurs., Disp: , Rfl:   Current facility-administered medications:  .  omalizumab Arvid Right) injection 300 mg, 300 mg, Subcutaneous, Q28 days, [name], MD, 300 mg at 04/19/15 1108  Meds ordered this encounter  Medications  . fluticasone (FLOVENT HFA) 220 MCG/ACT inhaler    Sig: Inhale 2 puffs into the lungs 2 (two) times daily.    Dispense:  1 Inhaler    Refill:  5  . pantoprazole (PROTONIX) 40 MG tablet    Sig:  Take 1 tablet (40 mg total) by mouth daily.    Dispense:  30 tablet    Refill:  5    Allergies  Allergen Reactions  . Lyrica [Pregabalin] Other (See Comments)    Blurred vision.  Marland Kitchen Penicillins     Has patient had a PCN reaction causing immediate rash, facial/tongue/throat swelling, SOB or lightheadedness with hypotension patient had a PCN reaction causing severe rash involving mucus membranes or skin necrosis: [id number]} Has patient had a PCN reaction that required hospitalization/No  Has patient had a PCN reaction occurring within the last 10 years: No If all of the above answers are "NO", then may proceed with Cephalosporin use.     Marland Kitchen Phenytoin Sodium Extended     Swelling   . Vicodin [Hydrocodone-Acetaminophen] Nausea And Vomiting    Review of Systems  HENT: Negative for congestion, ear pain, hearing loss, nosebleeds, sore throat and tinnitus.   Eyes: Negative for redness.  Respiratory: Negative for cough, sputum production, shortness of breath and wheezing.   Cardiovascular: Negative for chest pain, palpitations and leg swelling.  Gastrointestinal: Negative for heartburn, nausea and vomiting.  Skin: Negative for itching and rash.  Neurological: Negative for dizziness and headaches.     Objective:   Filed Vitals:   04/19/15 1133  BP: 104/72  Pulse: 88  Resp: 20    Physical Exam  Constitutional: She is oriented to person, place, and time and well-developed, well-nourished, and in no distress.  HENT:  Right Ear: Tympanic membrane and external ear normal.  Left Ear: Tympanic membrane and external ear normal.  Nose: No mucosal edema, rhinorrhea, nose lacerations or sinus tenderness.  Mouth/Throat: Oropharynx is clear and moist. No oral lesions. No oropharyngeal exudate, posterior oropharyngeal edema, posterior oropharyngeal erythema or tonsillar abscesses.  Eyes: Conjunctivae are normal.  Neck: No JVD present. No tracheal deviation present. No thyromegaly present.   Cardiovascular: Normal rate, regular rhythm and normal heart sounds.  Exam reveals no gallop and no friction rub.   No murmur heard. Pulmonary/Chest: No stridor. No respiratory distress. She has no wheezes. She has no rales. She exhibits no tenderness.  Musculoskeletal: She exhibits no edema.  Lymphadenopathy:    She has no cervical adenopathy.  Neurological: She is alert and oriented to person, place, and time.  Skin: No rash noted. No erythema. No pallor.    Diagnostics:    Spirometry was performed and demonstrated an FEV1 of 1.78 at 81 % of predicted.  The patient had an Asthma Control Test with the following results: ACT Total Score: 19.    Assessment and Plan:   1. Moderate persistent asthma, uncomplicated   2. Other allergic rhinitis   3. Gastroesophageal reflux disease, esophagitis presence not specified     Patient Instructions   1. Stop Advair and Qvar  2. Start Flovent 220 - two inhalations twice daily with spacer.  3. Continue Dymista one spray each nostril approximately 3-5 times per week  4. Continue pantoprazole 40mg  in the AM and Ranitidine 300mg  in the PM  5. Continue Xolair and Epi-Pen  6. Use Provental HFA if needed.  7. Return to clinic in 4 weeks or earlier if there is a problem.   I will remove Vallarie's LABA to lower her risk of developing a medication induced episode of a fib. Hopefully her asthma control will continue in the relatively good state she has been in the past six months while using a combination of of a ICS plus Omalizumab. I will see her back in this clinic in 4 weeks or earlier if there is a problem.   Allena Katz, MD [place]

## 2015-04-19 NOTE — Progress Notes (Signed)
Patient ID: [name], female   DOB: [birth year], 69 y.o.   MRN: [id number] 24 hour ambulatory blood pressure monitor applied to patient.

## 2015-04-19 NOTE — Patient Instructions (Addendum)
  1. Stop Advair and Qvar  2. Start Flovent 220 - two inhalations twice daily with spacer.  3. Continue Dymista one spray each nostril approximately 3-5 times per week  4. Continue pantoprazole 40mg  in the AM and Ranitidine 300mg  in the PM  5. Continue Xolair and Epi-Pen  6. Use Provental HFA if needed.  7. Return to clinic in 4 weeks or earlier if there is a problem.

## 2015-04-19 NOTE — Telephone Encounter (Signed)
LMTCB

## 2015-04-19 NOTE — Telephone Encounter (Signed)
Spoke with patient about connection to automatic BP cuff. Pt states she thinks problem is fixed, pt advised to call back if she still has problems with connection.

## 2015-04-20 ENCOUNTER — Telehealth: Payer: Self-pay

## 2015-04-20 ENCOUNTER — Other Ambulatory Visit: Payer: Self-pay

## 2015-04-20 ENCOUNTER — Telehealth: Payer: Self-pay | Admitting: Cardiovascular Disease

## 2015-04-20 ENCOUNTER — Ambulatory Visit (HOSPITAL_COMMUNITY): Payer: Medicare Other | Attending: Cardiology

## 2015-04-20 DIAGNOSIS — I340 Nonrheumatic mitral (valve) insufficiency: Secondary | ICD-10-CM | POA: Insufficient documentation

## 2015-04-20 DIAGNOSIS — I517 Cardiomegaly: Secondary | ICD-10-CM | POA: Diagnosis not present

## 2015-04-20 DIAGNOSIS — I272 Other secondary pulmonary hypertension: Secondary | ICD-10-CM | POA: Insufficient documentation

## 2015-04-20 DIAGNOSIS — R0789 Other chest pain: Secondary | ICD-10-CM | POA: Diagnosis not present

## 2015-04-20 DIAGNOSIS — I480 Paroxysmal atrial fibrillation: Secondary | ICD-10-CM | POA: Diagnosis not present

## 2015-04-20 DIAGNOSIS — I10 Essential (primary) hypertension: Secondary | ICD-10-CM | POA: Diagnosis not present

## 2015-04-20 DIAGNOSIS — R079 Chest pain, unspecified: Secondary | ICD-10-CM | POA: Diagnosis not present

## 2015-04-20 DIAGNOSIS — J4540 Moderate persistent asthma, uncomplicated: Secondary | ICD-10-CM | POA: Diagnosis not present

## 2015-04-20 MED ORDER — RIVAROXABAN 20 MG PO TABS: 20.0000 mg | ORAL_TABLET | Freq: Every day | ORAL | Status: DC

## 2015-04-20 NOTE — Addendum Note (Signed)
Addended by: Burtis Junes on: 04/20/2015 10:34 AM   Modules accepted: Orders

## 2015-04-20 NOTE — Telephone Encounter (Signed)
Pt was seen in ED 04/14/15 for chest pain; pt has followed up with cardiologist but pt has questions about gabapentin that was prescribed by neurologist; advised pt should contact neurologist with questions about dosages of gabapentin and pt voiced understanding.sending note to Dr Diona Browner as Juluis Rainier.

## 2015-04-20 NOTE — Telephone Encounter (Signed)
Spoke with pt, her other doctors have made changes in her medications and she wants to let dr c know since she is on a blood thinner. She was started on gabapentin 300 mg 2 tablets tid. Her advair and quvar were stopped and she was placed on flovent. Pt was asked to srart super B complex. Will forward for dr croitoru's review

## 2015-04-20 NOTE — Telephone Encounter (Signed)
Please call,needs to talk to you about some of her medications.

## 2015-04-21 NOTE — Telephone Encounter (Signed)
Left message for pt to call.

## 2015-04-21 NOTE — Telephone Encounter (Signed)
All those changes should be fine as far as her heart condition

## 2015-04-22 NOTE — Telephone Encounter (Signed)
Spoke with pt, aware of dr croitoru's recommendations.  

## 2015-04-27 ENCOUNTER — Telehealth: Payer: Self-pay | Admitting: Allergy and Immunology

## 2015-04-27 NOTE — Telephone Encounter (Signed)
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 pls advise

## 2015-04-27 NOTE — Telephone Encounter (Signed)
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

## 2015-04-27 NOTE — Telephone Encounter (Signed)
Left message for patient to call office.  

## 2015-04-28 ENCOUNTER — Telehealth: Payer: Self-pay | Admitting: Family Medicine

## 2015-04-28 ENCOUNTER — Other Ambulatory Visit (INDEPENDENT_AMBULATORY_CARE_PROVIDER_SITE_OTHER): Payer: Medicare Other

## 2015-04-28 DIAGNOSIS — E1140 Type 2 diabetes mellitus with diabetic neuropathy, unspecified: Secondary | ICD-10-CM

## 2015-04-28 LAB — LIPID PANEL
Cholesterol: 166 mg/dL (ref 0–200)
HDL: 55.4 mg/dL (ref >39.00)
LDL Cholesterol: 91 mg/dL (ref 0–99)
NonHDL: 110.42
Total CHOL/HDL Ratio: 3
Triglycerides: 95 mg/dL (ref 0.0–149.0)
VLDL: 19 mg/dL (ref 0.0–40.0)

## 2015-04-28 LAB — COMPREHENSIVE METABOLIC PANEL
ALT: 8 U/L (ref 0–35)
AST: 12 U/L (ref 0–37)
Albumin: 3.8 g/dL (ref 3.5–5.2)
Alkaline Phosphatase: 73 U/L (ref 39–117)
BUN: 14 mg/dL (ref 6–23)
CO2: 30 mEq/L (ref 19–32)
Calcium: 9.4 mg/dL (ref 8.4–10.5)
Chloride: 104 mEq/L (ref 96–112)
Creatinine, Ser: 1 mg/dL (ref 0.40–1.20)
GFR: 70.75 mL/min (ref >60.00)
Glucose, Bld: 130 mg/dL — ABNORMAL HIGH (ref 70–99)
Potassium: 4.4 mEq/L (ref 3.5–5.1)
Sodium: 142 mEq/L (ref 135–145)
Total Bilirubin: 0.5 mg/dL (ref 0.2–1.2)
Total Protein: 6.4 g/dL (ref 6.0–8.3)

## 2015-04-28 LAB — HEMOGLOBIN A1C: Hgb A1c MFr Bld: 6.3 % (ref 4.6–6.5)

## 2015-04-28 NOTE — Telephone Encounter (Signed)
-----   Message from Ellamae Sia sent at 04/20/2015  5:50 PM EDT ----- Regarding: Lab orders for Thursday, 10.13.16 Patient is scheduled for CPX labs, please order future labs, Thanks , Karna Christmas

## 2015-04-29 NOTE — Telephone Encounter (Signed)
Spoke to patient and she understood the advise that Dr. Neldon Mc gave her.

## 2015-05-02 ENCOUNTER — Ambulatory Visit: Payer: Medicare Other | Admitting: Physician Assistant

## 2015-05-02 ENCOUNTER — Telehealth: Payer: Self-pay | Admitting: Cardiovascular Disease

## 2015-05-02 NOTE — Telephone Encounter (Signed)
Please call,concerning her medicine.

## 2015-05-02 NOTE — Telephone Encounter (Signed)
Spoke to patient regarding furosemide, she states she is sometimes taking at night, usually taking around 4-5pm though. She wanted recommendation to take since she doesn't have good compliance with taking in AM and has "a lot of things" that make it hard for her to take it earlier in the day. She notes med works for several hours, and she is getting up to use the bathroom "a lot" following taking it.  Advised definitely avoid taking at night if possible, that mid-late afternoon probably best time for her if she is at home in the evening. Pt voiced understanding. Had a separate question related to metformin, which she will address at visit w/ PCP on Thursday.

## 2015-05-03 ENCOUNTER — Ambulatory Visit: Payer: Medicare Other | Admitting: Allergy and Immunology

## 2015-05-05 ENCOUNTER — Encounter: Payer: Self-pay | Admitting: Family Medicine

## 2015-05-05 ENCOUNTER — Ambulatory Visit (INDEPENDENT_AMBULATORY_CARE_PROVIDER_SITE_OTHER): Payer: Medicare Other | Admitting: Family Medicine

## 2015-05-05 VITALS — BP 135/78 | HR 81 | Temp 97.8°F | Ht 65.5 in | Wt 241.5 lb

## 2015-05-05 DIAGNOSIS — I10 Essential (primary) hypertension: Secondary | ICD-10-CM | POA: Diagnosis not present

## 2015-05-05 DIAGNOSIS — Z7189 Other specified counseling: Secondary | ICD-10-CM

## 2015-05-05 DIAGNOSIS — E119 Type 2 diabetes mellitus without complications: Secondary | ICD-10-CM

## 2015-05-05 DIAGNOSIS — Z0000 Encounter for general adult medical examination without abnormal findings: Secondary | ICD-10-CM | POA: Diagnosis not present

## 2015-05-05 DIAGNOSIS — Z23 Encounter for immunization: Secondary | ICD-10-CM | POA: Diagnosis not present

## 2015-05-05 DIAGNOSIS — E1140 Type 2 diabetes mellitus with diabetic neuropathy, unspecified: Secondary | ICD-10-CM

## 2015-05-05 DIAGNOSIS — E348 Other specified endocrine disorders: Secondary | ICD-10-CM | POA: Diagnosis not present

## 2015-05-05 DIAGNOSIS — E1340 Other specified diabetes mellitus with diabetic neuropathy, unspecified: Secondary | ICD-10-CM

## 2015-05-05 LAB — HM DIABETES FOOT EXAM

## 2015-05-05 NOTE — Patient Instructions (Addendum)
Hold metformin for the next 3 months, work on low carb diet. Get back to exercise Stop at front desk to set up bone density..  Stop at front desk to set up nutritionist.

## 2015-05-05 NOTE — Progress Notes (Signed)
Pre visit review using our clinic review tool, if applicable. No additional management support is needed unless otherwise documented below in the visit note. 

## 2015-05-05 NOTE — Progress Notes (Signed)
69 year old female pt presents for medicare wellness. I have personally reviewed the Medicare I have personally reviewed the Medicare Annual Wellness questionnaire and have noted 1. The patient's medical and social history 2. Their use of alcohol, tobacco or illicit drugs 3. Their current medications and supplements 4. The patient's functional ability including ADL's, fall risks, home safety risks and hearing or visual             impairment. 5. Diet and physical activities 6. Evidence for depression or mood disorders 7.         Updated provider list Cognitive evaluation was performed and recorded on pt medicare questionnaire form. The patients weight, height, BMI and visual acuity have been recorded in the chart  I have made referrals, counseling and provided education to the patient based review of the above and I have provided the pt with a written personalized care plan for preventive services.   She is followed by cardiology for pulmonary hypertension, mild to moderate, asymptomatic. Dr. Sallyanne Kuster.   She has a history of DVT and PE following orthopedic surgery in 2004 and was treated with coumadin for one year. At that time, her hypercoagulable w/u was negative except for a positive lupus anticoagulant, which was no longer seen ontest from 10/2003. Echo in 2008 showed moderate pulmonary hypertension, with similar estimated PA pressure by f/u echo in 2011 and 2014. In 2012, right heart cath showed RA 16,RV 54/15,PA 50/20, PCWP 16-20, CO 4.8, mean PA 30, and SVR calculated to 2.5 wood units. During that hospitalization off coumadin her ESR was normal, protein C normall, protein S level normal but activity decreased?, neg anticardiolipin Ab, and lupus anticoagulant was not detected. She has had a CT chest which showed no parenchymal lung disease or other abnormality. Reportedly she had a normal sleep study in the past.   Dr. Gwenette Greet and Dr. Neldon Mc with pulmonology and allergist follows her asthma,  well controlled.  Changed to flovent to avoi increasing HR , Dymist, clariovnex uses Proair for rescue.  Went to ER on 9/29. Was SOB, palpitations, chest pain. Dx with fibrillation Started on xarelto and low dose CCB.  Followed with cardiology on 04/19/2015  Hypertension: Well controlled today on diovan, lasix prn. BP Readings from Last 3 Encounters:  05/05/15 135/78  04/19/15 104/72  04/19/15 122/80              Using medication without problems or lightheadedness: None Chest pain with exertion:None Edema:None Short of breath:yes, sleeping on incline Average home BPs:not checking Other issues:  Diabetes: Well controlled, but metformin causing diarrhea. She wishes to stop this med.  Lab Results  Component Value Date   HGBA1C 6.3 04/28/2015  Using medications without difficulties:None Hypoglycemic episodes:? Hyperglycemic episodes:? Feet problems:none Blood Sugars averaging:not checking eye exam within last year:yes  Elevated Cholesterol:LDL at goal < 100 on no medication. Lab Results  Component Value Date   CHOL 166 04/28/2015   HDL 55.40 04/28/2015   LDLCALC 91 04/28/2015   TRIG 95.0 04/28/2015   CHOLHDL 3 04/28/2015   Diet compliance: Good control. Exercise:None Other complaints:   Review of Systems  Constitutional: Positive for fatigue.  Psychiatric/Behavioral:   Insomnia       Objective:   Physical Exam  Constitutional: Vital signs are normal. She appears well-developed and well-nourished. She is cooperative. Non-toxic appearance. She does not appear ill. No distress.  Overweight female in NAD  HENT:  Head: Normocephalic.  Right Ear: Hearing, tympanic membrane, external ear and  ear canal normal. Tympanic membrane is not erythematous, not retracted and not bulging.  Left Ear: Hearing, tympanic membrane, external ear and ear canal normal. Tympanic membrane is not erythematous, not retracted and not bulging.  Nose: Nose normal. No mucosal  edema or rhinorrhea. Right sinus exhibits no maxillary sinus tenderness and no frontal sinus tenderness. Left sinus exhibits no maxillary sinus tenderness and no frontal sinus tenderness.  Mouth/Throat: Uvula is midline, oropharynx is clear and moist and mucous membranes are normal.  Eyes: Conjunctivae, EOM and lids are normal. Pupils are equal, round, and reactive to light. Lids are everted and swept, no foreign bodies found.  Neck: Trachea normal and normal range of motion. Neck supple. Carotid bruit is not present. No mass and no thyromegaly present.  Cardiovascular: Normal rate, regular rhythm, S1 normal, S2 normal, normal heart sounds, intact distal pulses and normal pulses. Exam reveals no gallop and no friction rub.  No murmur heard. Pulmonary/Chest: Effort normal and breath sounds normal. Not tachypneic. No respiratory distress. She has no decreased breath sounds. She has no wheezes. She has no rhonchi. She has no rales.  Abdominal: Soft. Normal appearance and bowel sounds are normal. She exhibits no distension, no fluid wave, no abdominal bruit and no mass. There is no hepatosplenomegaly. There is no tenderness. There is no rebound, no guarding and no CVA tenderness. No hernia.  Genitourinary: Vagina normal. No breast swelling, tenderness, discharge or bleeding. Pelvic exam was performed with patient supine. There is no rash, tenderness or lesion on the right labia. There is no rash, tenderness or lesion on the left labia. Right adnexum displays no mass, no tenderness and no fullness. Left adnexum displays no mass, no tenderness and no fullness.  No uterus or cervix  Lymphadenopathy:   She has no cervical adenopathy.   She has no axillary adenopathy.  Neurological: She is alert. She has normal strength. No cranial nerve deficit or sensory deficit.  Skin: Skin is warm, dry and intact. No rash noted.  Psychiatric: Her speech is normal and behavior is normal. Judgment and thought content  normal. Her mood appears not anxious. Cognition and memory are normal. She does not exhibit a depressed mood.   Diabetic foot exam: Normal inspection No skin breakdown No calluses  Normal DP pulses Normal sensation to light touch and monofilament Nails normal        Assessment & Plan:  The patient's preventative maintenance and recommended screening tests for an annual wellness exam were reviewed in full today. Brought up to date unless services declined.  Counselled on the importance of diet, exercise, and its role in overall health and mortality. The patient's FH and SH was reviewed, including their home life, tobacco status, and drug and alcohol status.   Vaccines: uptodate  Mammogram:early 2016  PAP not indicated partial hysterectomy, has ovaries,  DVE every 2 years. Colon:2014 Dr. Cristina Gong, normal, repeat in 10 years DEXA: due Nonsmoker

## 2015-05-09 ENCOUNTER — Telehealth: Payer: Self-pay | Admitting: Allergy and Immunology

## 2015-05-09 ENCOUNTER — Telehealth: Payer: Self-pay | Admitting: Cardiovascular Disease

## 2015-05-09 MED ORDER — DILTIAZEM HCL ER COATED BEADS 120 MG PO CP24: 120.0000 mg | ORAL_CAPSULE | Freq: Every day | ORAL | Status: DC

## 2015-05-09 NOTE — Telephone Encounter (Signed)
Diltiazem Rx'ed after ED visit - informed her she should continue to take this medication, she was only given a 30 day supply. Rx(s) sent to pharmacy electronically.

## 2015-05-09 NOTE — Telephone Encounter (Signed)
Spoke with patient who is coughing and wheezing a lot. Informed patient of Dr Lyndon Code advice and patient mentioned of trying to get an appointment with her PCP.

## 2015-05-09 NOTE — Telephone Encounter (Signed)
Please inform patient that she can use nebulizer every 4-6 hours if needed. She should be seen if she is not doing well or does not improve.

## 2015-05-09 NOTE — Telephone Encounter (Signed)
[name] is callling about a prescription (Diltiazem 120mg  ) wants to know if she continue to take this medication .Marland Kitchen Please call   Thanks

## 2015-05-09 NOTE — Telephone Encounter (Signed)
Pt had a very hard time communicating over the phone. She is wheezing and coughing. Wants to know if she can double her neb medication.

## 2015-05-13 ENCOUNTER — Telehealth: Payer: Self-pay | Admitting: Cardiovascular Disease

## 2015-05-13 DIAGNOSIS — I10 Essential (primary) hypertension: Secondary | ICD-10-CM | POA: Diagnosis not present

## 2015-05-13 DIAGNOSIS — E118 Type 2 diabetes mellitus with unspecified complications: Secondary | ICD-10-CM | POA: Diagnosis not present

## 2015-05-13 DIAGNOSIS — I4891 Unspecified atrial fibrillation: Secondary | ICD-10-CM | POA: Diagnosis not present

## 2015-05-13 NOTE — Telephone Encounter (Signed)
Blood pressure seems to be good on current medical regimen. I'm not sure amlodipine is critical for her, although we are occasionally also uses it as an antianginal medication. If she is not having angina , do not need to restart this medication.

## 2015-05-13 NOTE — Telephone Encounter (Signed)
Returned call to patient she stated she saw diabetic Dr.today and was asked if she was taking amlodipine.Stated she has not taken in a long time.B/P today at visit 120/80.Advised I will send message to Dr.Croitoru for advice.

## 2015-05-13 NOTE — Telephone Encounter (Signed)
Please contact patient in reference to medication that she believes she is suppose to be taking

## 2015-05-16 NOTE — Telephone Encounter (Signed)
Returned call to patient.Dr.Croitoru advised do not start amlodipine.Advised to keep appointment with Dr.Croitoru 06/02/15 at 11:45 am.

## 2015-05-17 ENCOUNTER — Ambulatory Visit (INDEPENDENT_AMBULATORY_CARE_PROVIDER_SITE_OTHER): Payer: Medicare Other | Admitting: Allergy and Immunology

## 2015-05-17 ENCOUNTER — Encounter: Payer: Self-pay | Admitting: Allergy and Immunology

## 2015-05-17 VITALS — BP 128/72 | HR 104 | Resp 20

## 2015-05-17 DIAGNOSIS — J4540 Moderate persistent asthma, uncomplicated: Secondary | ICD-10-CM | POA: Diagnosis not present

## 2015-05-17 DIAGNOSIS — J3089 Other allergic rhinitis: Secondary | ICD-10-CM

## 2015-05-17 DIAGNOSIS — K219 Gastro-esophageal reflux disease without esophagitis: Secondary | ICD-10-CM

## 2015-05-17 NOTE — Progress Notes (Signed)
[name]  Follow-up Note  Refering Provider: [name], MD Primary Provider: [name], MD  Subjective:   [name] is a 69 y.o. female who [facility] in re-evaluation of the following:  HPI Comments:  [name] return to this clinic in reevaluation of her asthma and allergic rhinoconjunctivitis and gastroesophageal reflux disease with a component of LPR. During her last visit we eliminated her long-acting bronchodilator given her history of atrial fibrillation and we switched her to monotherapy with Flovent. Overall she has done well however she has noticed that around bedtime when she lays down she has a little bit more problems with wheezing in her throat. She really doesn't have any shortness of breath or chest tightness. She just noticed that her throat feels a little bit congested. Interestingly, her reflux has been somewhat active. She is also been eating chocolate quite extensively over the course of the past 2 weeks which correlates with her throat complaints. She has had no other respiratory tract symptoms nor fever nor ugly sputum production. She's not really sure that her throat issue response when she uses a short acting bronchodilator.  Her nose is been doing quite well. She did have an episode of epistaxis involving the left side for which she stopped her nasal steroid for 10 days or so.   Outpatient Encounter Prescriptions as of 05/17/2015  Medication Sig  . acetaminophen (TYLENOL) 325 MG tablet Take 650 mg by mouth every 6 (six) hours as needed for moderate pain (pain). pain  . albuterol (PROAIR HFA) 108 (90 BASE) MCG/ACT inhaler Inhale 2 puffs into the lungs every 6 (six) hours as needed for wheezing (wheezing). Shortness of breath  . albuterol (PROVENTIL) (2.5 MG/3ML) 0.083% nebulizer solution Take 2.5 mg by nebulization every 4 (four) hours as needed for wheezing or shortness  of breath.  Marland Kitchen amLODipine (NORVASC) 5 MG tablet Take 5 mg by mouth daily.  . Azelastine-Fluticasone (DYMISTA) 137-50 MCG/ACT SUSP Place 1 puff into the nose every other day.   . Biotin 1 MG CAPS Take 1 mg by mouth as needed (muscles).   . cyclobenzaprine (FLEXERIL) 10 MG tablet Take 10 mg by mouth daily as needed for muscle spasms.   Marland Kitchen diltiazem (CARDIZEM CD) 120 MG 24 hr capsule Take 1 capsule (120 mg total) by mouth daily.  . DiphenhydrAMINE HCl (BENADRYL PO) Take 1 tablet by mouth daily as needed (itching).   . DULoxetine (CYMBALTA) 30 MG capsule Take 30 mg by mouth daily.  Marland Kitchen EPINEPHrine (EPIPEN 2-PAK) 0.3 mg/0.3 mL IJ SOAJ injection Inject 0.3 mg into the muscle as needed (ALLERGIC REACTION).   . fluticasone (FLOVENT HFA) 220 MCG/ACT inhaler Inhale 2 puffs into the lungs 2 (two) times daily.  . furosemide (LASIX) 40 MG tablet Take 1 tablet (40 mg total) by mouth daily.  Marland Kitchen gabapentin (NEURONTIN) 300 MG capsule Take 600 mg by mouth 3 (three) times daily.  . Ginkgo Biloba 40 MG TABS Take 1 tablet by mouth 3 (three) times daily.  . hydrOXYzine (VISTARIL) 25 MG capsule TAKE ONE CAPSULE BY MOUTH EVERY 8 HOURS AS NEEDED  . Lactase (DAIRY-RELIEF PO) Take 1 capsule by mouth daily as needed (stomach upset).   . Magnesium 250 MG TABS Take 1 tablet by mouth daily.  . metFORMIN (GLUCOPHAGE) 500 MG tablet TAKE 1 TABLET (500 MG TOTAL) BY MOUTH 2 (TWO) TIMES DAILY WITH A MEAL.  . montelukast (SINGULAIR)  10 MG tablet Take 10 mg by mouth daily.    . Olopatadine HCl (PATADAY) 0.2 % SOLN Apply 1 drop to eye daily as needed (dry eyes).   . Omega 3-6-9 Fatty Acids (OMEGA 3-6-9 COMPLEX PO) Take 2 capsules by mouth daily.  . ondansetron (ZOFRAN ODT) 4 MG disintegrating tablet Take 1 tablet (4 mg total) by mouth every 8 (eight) hours as needed for nausea or vomiting.  [name] LANCETS 52W MISC as needed (blood check).   [name] test strip 1 each by Other route as needed (blood monitoring).   .  pantoprazole (PROTONIX) 40 MG tablet Take 1 tablet (40 mg total) by mouth daily.  . Potassium Chloride CR (MICRO-K) 8 MEQ CPCR capsule CR Take 8 mEq by mouth daily.   . Probiotic Product (ALIGN) 4 MG CAPS Take 1 capsule by mouth daily.  . ranitidine (ZANTAC) 300 MG capsule Take 300 mg by mouth every evening.  . rivaroxaban (XARELTO) 20 MG TABS tablet Take 1 tablet (20 mg total) by mouth daily with supper.  . sodium chloride (OCEAN) 0.65 % SOLN nasal spray Place 2 sprays into both nostrils daily as needed for congestion (congestion).   . SUMAtriptan (IMITREX) 100 MG tablet Take 100 mg x 1 may, repeat in 2 hour for migraine.  . meloxicam (MOBIC) 15 MG tablet Take 15 mg by mouth daily.  . SUMAtriptan (IMITREX) 100 MG tablet Take 100 mg by mouth as needed for migraine. May repeat in 2 hours if headache persists or recurs.  . traMADol (ULTRAM) 50 MG tablet Take by mouth every 6 (six) hours as needed for severe pain.  . valsartan (DIOVAN) 160 MG tablet Take 1 tablet (160 mg total) by mouth daily.   Facility-Administered Encounter Medications as of 05/17/2015  Medication  . omalizumab Arvid Right) injection 300 mg    No orders of the defined types were placed in this encounter.    Past Medical History  Diagnosis Date  . Hypertension   . DVT (deep venous thrombosis) ([place])   . Pulmonary embolism ([place])   . Diabetes mellitus   . Seizures ([place])   . Allergic rhinitis   . Chronic headache   . Pulmonary hypertension ([place])   . Dyspnea   . Asthma   . Arthritis   . Allergy   . Heart murmur     Past Surgical History  Procedure Laterality Date  . Knee surgery    . Plantar fascia surgery    . Partial hysterectomy    . Tonsillectomy    . Cardiac catheterization  12/28/2010    Mod. pulmonary hypertension, normal coronary arteries  . Doppler echocardiography  10/08/2011    EF=>55%,mild asymmetric LVH, mod. TR, mod. PH, mild to mod LA dilatation  . Nuclear stress test  05/20/2006    No ischemia  .  Abdominal hysterectomy      partial, has ovaries  . Knee arthroscopy Left     Allergies  Allergen Reactions  . Lyrica [Pregabalin] Other (See Comments)    Blurred vision.  Marland Kitchen Penicillins     Has patient had a PCN reaction causing immediate rash, facial/tongue/throat swelling, SOB or lightheadedness with hypotension patient had a PCN reaction causing severe rash involving mucus membranes or skin necrosis: [id number]} Has patient had a PCN reaction that required hospitalization/No Has patient had a PCN reaction occurring within the last 10 years: No If all of the above answers are "NO", then may proceed with Cephalosporin use.     Marland Kitchen  Phenytoin Sodium Extended     Swelling   . Vicodin [Hydrocodone-Acetaminophen] Nausea And Vomiting    Review of Systems  Constitutional: Negative for fever and chills.  HENT: Negative for congestion, ear pain, facial swelling, nosebleeds, postnasal drip, rhinorrhea, sinus pressure, sneezing, sore throat, trouble swallowing and voice change.   Eyes: Negative for pain, discharge, redness and itching.  Respiratory: Positive for cough and wheezing. Negative for choking, chest tightness, shortness of breath and stridor.   Cardiovascular: Negative for chest pain and leg swelling.  Gastrointestinal: Negative for nausea, vomiting and abdominal pain.  Endocrine: Negative for cold intolerance and heat intolerance.  Allergic/Immunologic: Negative.   Neurological: Negative for dizziness.  Hematological: Negative for adenopathy.     Objective:   Filed Vitals:   05/17/15 1135  BP: 128/72  Pulse: 104  Resp: 20          Physical Exam  Constitutional: She appears well-developed and well-nourished. No distress.  HENT:  Head: Normocephalic and atraumatic. Head is without right periorbital erythema and without left periorbital erythema.  Right Ear: Tympanic membrane, external ear and ear canal normal. No drainage or tenderness. No foreign bodies. Tympanic  membrane is not injected, not scarred, not perforated, not erythematous, not retracted and not bulging. No middle ear effusion.  Left Ear: Tympanic membrane, external ear and ear canal normal. No drainage or tenderness. No foreign bodies. Tympanic membrane is not injected, not scarred, not perforated, not erythematous, not retracted and not bulging.  No middle ear effusion.  Nose: Nose normal. No mucosal edema, rhinorrhea, nose lacerations or sinus tenderness.  No foreign bodies.  Mouth/Throat: Oropharynx is clear and moist. No oropharyngeal exudate, posterior oropharyngeal edema, posterior oropharyngeal erythema or tonsillar abscesses.  Eyes: Lids are normal. Right eye exhibits no chemosis, no discharge and no exudate. No foreign body present in the right eye. Left eye exhibits no chemosis, no discharge and no exudate. No foreign body present in the left eye. Right conjunctiva is not injected. Left conjunctiva is not injected.  Neck: Neck supple. No tracheal tenderness present. No tracheal deviation and no edema present. No thyroid mass and no thyromegaly present.  Cardiovascular: Normal rate, regular rhythm, S1 normal and S2 normal.  Exam reveals no gallop.   No murmur heard. Pulmonary/Chest: No accessory muscle usage or stridor. No respiratory distress. She has no wheezes. She has no rhonchi. She has no rales.  Abdominal: Soft. There is no hepatosplenomegaly. There is no tenderness. There is no rigidity, no rebound and no guarding.  Musculoskeletal: She exhibits no edema.  Lymphadenopathy:       Head (right side): No tonsillar adenopathy present.       Head (left side): No tonsillar adenopathy present.    She has no cervical adenopathy.  Neurological: She is alert.  Skin: No rash noted. She is not diaphoretic.  Psychiatric: She has a normal mood and affect. Her behavior is normal.    Diagnostics:    Spirometry was performed and demonstrated an FEV1 of 1.75 at 89 % of predicted.  The  patient had an Asthma Control Test with the following results: ACT Total Score: 13.    Assessment and Plan:   1. Asthma, moderate persistent, well-controlled   2. Other allergic rhinitis   3. Gastroesophageal reflux disease, esophagitis presence not specified      1. Continue Flovent 220 - two inhalations twice daily with spacer.  2. Continue Dymista one spray each nostril approximately 3-5 times per week  3. Continue pantoprazole  40mg  in the AM and Ranitidine 300mg  in the PM  4. Continue Xolair and Epi-Pen  5. Use Provental HFA if needed.  6. Stop all chocolate consumption.  7. Return to clinic in 12 weeks or earlier if there is a problem.  I'm going to make the assumption that Beckie Busing is respiratory complaints are related more to her reflux affecting her laryngeal structure which was precipitated by her rather significant chocolate consumption the past 2 weeks. Hopefully that is the case and we will not need to put her back on a long-acting bronchodilator given her history of atrial fibrillation. She's going to contact me in approximately 2 weeks noting her response to this rather conservative approach and we'll make a determination on how to proceed at that point in time.   Allena Katz, MD [place]

## 2015-05-17 NOTE — Patient Instructions (Addendum)
   1. Continue Flovent 220 - two inhalations twice daily with spacer.  2. Continue Dymista one spray each nostril approximately 3-5 times per week  3. Continue pantoprazole 40mg  in the AM and Ranitidine 300mg  in the PM  4. Continue Xolair and Epi-Pen  5. Use Provental HFA if needed.  6. Stop all chocolate consumption.  7. Return to clinic in 12 weeks or earlier if there is a problem.

## 2015-05-18 DIAGNOSIS — J3089 Other allergic rhinitis: Secondary | ICD-10-CM | POA: Diagnosis not present

## 2015-05-18 DIAGNOSIS — Z780 Asymptomatic menopausal state: Secondary | ICD-10-CM | POA: Diagnosis not present

## 2015-05-18 DIAGNOSIS — K219 Gastro-esophageal reflux disease without esophagitis: Secondary | ICD-10-CM | POA: Diagnosis not present

## 2015-05-18 DIAGNOSIS — J4540 Moderate persistent asthma, uncomplicated: Secondary | ICD-10-CM | POA: Diagnosis not present

## 2015-05-24 DIAGNOSIS — R194 Change in bowel habit: Secondary | ICD-10-CM | POA: Diagnosis not present

## 2015-05-24 DIAGNOSIS — R197 Diarrhea, unspecified: Secondary | ICD-10-CM | POA: Diagnosis not present

## 2015-05-25 DIAGNOSIS — E118 Type 2 diabetes mellitus with unspecified complications: Secondary | ICD-10-CM | POA: Diagnosis not present

## 2015-05-26 ENCOUNTER — Encounter: Payer: Self-pay | Admitting: Family Medicine

## 2015-05-26 DIAGNOSIS — M1711 Unilateral primary osteoarthritis, right knee: Secondary | ICD-10-CM | POA: Diagnosis not present

## 2015-05-26 DIAGNOSIS — M25561 Pain in right knee: Secondary | ICD-10-CM | POA: Diagnosis not present

## 2015-05-30 ENCOUNTER — Other Ambulatory Visit: Payer: Self-pay | Admitting: Neurology

## 2015-05-30 DIAGNOSIS — K219 Gastro-esophageal reflux disease without esophagitis: Secondary | ICD-10-CM

## 2015-05-30 MED ORDER — PANTOPRAZOLE SODIUM 40 MG PO TBEC: 40.0000 mg | DELAYED_RELEASE_TABLET | Freq: Every day | ORAL | Status: DC

## 2015-05-31 ENCOUNTER — Telehealth: Payer: Self-pay | Admitting: Cardiovascular Disease

## 2015-05-31 ENCOUNTER — Other Ambulatory Visit: Payer: Self-pay

## 2015-05-31 ENCOUNTER — Telehealth: Payer: Self-pay | Admitting: Family Medicine

## 2015-05-31 ENCOUNTER — Ambulatory Visit: Payer: Medicare Other | Admitting: *Deleted

## 2015-05-31 MED ORDER — CYCLOBENZAPRINE HCL 10 MG PO TABS: 10.0000 mg | ORAL_TABLET | Freq: Every day | ORAL | Status: DC | PRN

## 2015-05-31 NOTE — Assessment & Plan Note (Signed)
Well controlled. Continue current medication. Encouraged exercise, weight loss, healthy eating habits.  

## 2015-05-31 NOTE — Telephone Encounter (Signed)
Pt left v/m requesting refill flexeril to [facility]. Last annual 05/05/15.Please advise.

## 2015-05-31 NOTE — Telephone Encounter (Signed)
Please call,having a lot of pain in her back. Question about what medicine she can take,she is on a blood thinner.

## 2015-05-31 NOTE — Telephone Encounter (Signed)
Opened in error

## 2015-05-31 NOTE — Telephone Encounter (Signed)
Pt called in requesting a prescription for Flexeril for muscle spasms. Please f/u with pt  Thanks

## 2015-05-31 NOTE — Telephone Encounter (Signed)
Pt requesting flexeril for non-cardiac complaint. Pt advised to contact PCP office for medication. She voiced understanding.

## 2015-05-31 NOTE — Assessment & Plan Note (Addendum)
Hold metformin for the next 3 months, work on low carb diet. Get back to exercise.  referral to nutritionist.

## 2015-05-31 NOTE — Assessment & Plan Note (Signed)
Stable control. 

## 2015-05-31 NOTE — Telephone Encounter (Signed)
Spoke with pt, she is having stabbing type pain in her back at the shoulder blade level with movement. Okay given for pt to take flexeril and/or ultram for the discomfort.

## 2015-05-31 NOTE — Telephone Encounter (Signed)
p called back requesting status of refill; advised pt flexeril was sent electronically to [facility]. Pt voiced understanding.

## 2015-06-02 ENCOUNTER — Ambulatory Visit (INDEPENDENT_AMBULATORY_CARE_PROVIDER_SITE_OTHER): Payer: Medicare Other | Admitting: Cardiovascular Disease

## 2015-06-02 ENCOUNTER — Encounter: Payer: Self-pay | Admitting: Cardiovascular Disease

## 2015-06-02 VITALS — BP 148/72 | HR 92 | Ht 65.0 in | Wt 249.7 lb

## 2015-06-02 DIAGNOSIS — E1140 Type 2 diabetes mellitus with diabetic neuropathy, unspecified: Secondary | ICD-10-CM

## 2015-06-02 DIAGNOSIS — I480 Paroxysmal atrial fibrillation: Secondary | ICD-10-CM

## 2015-06-02 DIAGNOSIS — I10 Essential (primary) hypertension: Secondary | ICD-10-CM | POA: Diagnosis not present

## 2015-06-02 DIAGNOSIS — I272 Other secondary pulmonary hypertension: Secondary | ICD-10-CM

## 2015-06-02 NOTE — Progress Notes (Signed)
Patient ID: [name], female   DOB: [birth year], 69 y.o.   MRN: [id number]     Cardiology Office Note   Date:  06/03/2015   ID:  Alicia, Price 12/16/1945, MRN [id number]  PCP:  Alicia Lofts, MD  Cardiologist:   Alicia Klein, MD   Chief Complaint  Patient presents with  . Follow-up    No complaints of chest pain.  Occas. SOB, edema and dizziness      History of Present Illness: [name] is a 69 y.o. female who presents for  Axes mow atrial fibrillation and history of deep venous thrombosis/pulmonary embolism. Recently she has been having worsening bilateral wheezing and hoarseness, a triple to allergies. She's been having some pain in her left shoulder that changes with position of her arm and is clearly musculoskeletal. She also complains of a bad headache today. She has not had fever or chills. She has not been aware of palpitations recently.  She was seen in the emergency room in late September with atrial fibrillation with rapid ventricular response that converted to normal rhythm after administration of diltiazem for rate control.  Previous coronary angiography showed no evidence of CAD. Right heart catheterization showed systolic PA pressure of 50 mmHg. Previous hypercoagulable workup was equivocal. Lupus anticoagulant was positive on one occasion but not on repeat assay. Protein S level was normal but protein S activity was decreased on one occasion.  She has control systemic hypertension and has difficulty with a variety of antihypertensive medications in the past , but seems to tolerate diltiazem well.  Past Medical History  Diagnosis Date  . Hypertension   . DVT (deep venous thrombosis) ([name])   . Pulmonary embolism ([name])   . Diabetes mellitus   . Seizures ([name])   . Allergic rhinitis   . Chronic headache   . Pulmonary hypertension ([name])   . Dyspnea   . Asthma   . Arthritis   . Allergy   . Heart murmur     Past Surgical History  Procedure  Laterality Date  . Knee surgery    . Plantar fascia surgery    . Partial hysterectomy    . Tonsillectomy    . Cardiac catheterization  12/28/2010    Mod. pulmonary hypertension, normal coronary arteries  . Doppler echocardiography  10/08/2011    EF=>55%,mild asymmetric LVH, mod. TR, mod. PH, mild to mod LA dilatation  . Nuclear stress test  05/20/2006    No ischemia  . Abdominal hysterectomy      partial, has ovaries  . Knee arthroscopy Left      Current Outpatient Prescriptions  Medication Sig Dispense Refill  . acetaminophen (TYLENOL) 325 MG tablet Take 650 mg by mouth every 6 (six) hours as needed for moderate pain (pain). pain    . albuterol (PROAIR HFA) 108 (90 BASE) MCG/ACT inhaler Inhale 2 puffs into the lungs every 6 (six) hours as needed for wheezing (wheezing). Shortness of breath    . albuterol (PROVENTIL) (2.5 MG/3ML) 0.083% nebulizer solution Take 2.5 mg by nebulization every 4 (four) hours as needed for wheezing or shortness of breath.    Marland Kitchen amLODipine (NORVASC) 5 MG tablet Take 5 mg by mouth daily.  3  . Azelastine-Fluticasone (DYMISTA) 137-50 MCG/ACT SUSP Place 1 puff into the nose every other day.     . Biotin 1 MG CAPS Take 1 mg by mouth as needed (muscles).     . cyclobenzaprine (FLEXERIL) 10 MG tablet Take 1 tablet (10  mg total) by mouth daily as needed for muscle spasms. 30 tablet 0  . diltiazem (CARDIZEM CD) 120 MG 24 hr capsule Take 1 capsule (120 mg total) by mouth daily. 90 capsule 1  . DiphenhydrAMINE HCl (BENADRYL PO) Take 1 tablet by mouth daily as needed (itching).     . DULoxetine (CYMBALTA) 30 MG capsule Take 30 mg by mouth daily.    Marland Kitchen EPINEPHrine (EPIPEN 2-PAK) 0.3 mg/0.3 mL IJ SOAJ injection Inject 0.3 mg into the muscle as needed (ALLERGIC REACTION).     . fluticasone (FLOVENT HFA) 220 MCG/ACT inhaler Inhale 2 puffs into the lungs 2 (two) times daily. 1 Inhaler 5  . furosemide (LASIX) 40 MG tablet Take 1 tablet (40 mg total) by mouth daily. 30 tablet 3  .  gabapentin (NEURONTIN) 300 MG capsule Take 600 mg by mouth 3 (three) times daily.  6  . Ginkgo Biloba 40 MG TABS Take 1 tablet by mouth 3 (three) times daily.    . hydrOXYzine (VISTARIL) 25 MG capsule TAKE ONE CAPSULE BY MOUTH EVERY 8 HOURS AS NEEDED  3  . Lactase (DAIRY-RELIEF PO) Take 1 capsule by mouth daily as needed (stomach upset).     . Magnesium 250 MG TABS Take 1 tablet by mouth daily.    . meloxicam (MOBIC) 15 MG tablet Take 15 mg by mouth daily.    . metFORMIN (GLUCOPHAGE) 500 MG tablet TAKE 1 TABLET (500 MG TOTAL) BY MOUTH 2 (TWO) TIMES DAILY WITH A MEAL. 60 tablet 2  . montelukast (SINGULAIR) 10 MG tablet Take 10 mg by mouth daily.      . Olopatadine HCl (PATADAY) 0.2 % SOLN Apply 1 drop to eye daily as needed (dry eyes).     . Omega 3-6-9 Fatty Acids (OMEGA 3-6-9 COMPLEX PO) Take 2 capsules by mouth daily.    . ondansetron (ZOFRAN ODT) 4 MG disintegrating tablet Take 1 tablet (4 mg total) by mouth every 8 (eight) hours as needed for nausea or vomiting. 10 tablet 0  . ONETOUCH DELICA LANCETS [id number] MISC as needed (blood check).     [name] VERIO test strip 1 each by Other route as needed (blood monitoring).     . pantoprazole (PROTONIX) 40 MG tablet Take 1 tablet (40 mg total) by mouth daily. 30 tablet 5  . Potassium Chloride CR (MICRO-K) 8 MEQ CPCR capsule CR Take 8 mEq by mouth daily.     . Probiotic Product (ALIGN) 4 MG CAPS Take 1 capsule by mouth daily.    . ranitidine (ZANTAC) 300 MG capsule Take 300 mg by mouth every evening.    . rivaroxaban (XARELTO) 20 MG TABS tablet Take 1 tablet (20 mg total) by mouth daily with supper. 30 tablet 6  . sodium chloride (OCEAN) 0.65 % SOLN nasal spray Place 2 sprays into both nostrils daily as needed for congestion (congestion).     . SUMAtriptan (IMITREX) 100 MG tablet Take 100 mg x 1 may, repeat in 2 hour for migraine. 10 tablet 0  . SUMAtriptan (IMITREX) 100 MG tablet Take 100 mg by mouth as needed for migraine. May repeat in 2 hours if  headache persists or recurs.    . traMADol (ULTRAM) 50 MG tablet Take by mouth every 6 (six) hours as needed for severe pain.    . valsartan (DIOVAN) 160 MG tablet Take 1 tablet (160 mg total) by mouth daily. 30 tablet 6   Current Facility-Administered Medications  Medication Dose Route Frequency Provider Last Rate  Last Dose  . omalizumab Arvid Right) injection 300 mg  300 mg Subcutaneous Q28 days [name], MD   300 mg at 05/17/15 1137    Allergies:   Lyrica; Penicillins; Phenytoin sodium extended; and Vicodin    Social History:  The patient  reports that she has never smoked. She has never used smokeless tobacco. She reports that she drinks alcohol. She reports that she does not use illicit drugs.   Family History:  The patient's family history includes Alcohol abuse in her father; Allergies in her other; Arthritis in her mother and sister; Breast cancer in her mother; Cancer in her brother; Clotting disorder in her mother; Diabetes in her mother and sister; Hypertension in her mother; Multiple sclerosis in her sister; Stroke in her mother.    ROS:  Please see the history of present illness.    Otherwise, review of systems positive for none.   All other systems are reviewed and negative.    PHYSICAL EXAM: VS:  BP 148/72 mmHg  Pulse 92  Ht 5\' 5"  (1.651 m)  Wt 249 lb 11.2 oz (113.263 kg)  BMI 41.55 kg/m2 , BMI Body mass index is 41.55 kg/(m^2).  General: Alert, oriented x3, no distress.  She is hoarse Head: no evidence of trauma, PERRL, EOMI, no exophtalmos or lid lag, no myxedema, no xanthelasma; normal ears, nose and oropharynx Neck: normal jugular venous pulsations and no hepatojugular reflux; brisk carotid pulses without delay and no carotid bruits Chest:  Few scattered wheezes, no signs of consolidation by percussion or palpation, normal fremitus, symmetrical and full respiratory excursions Cardiovascular: normal position and quality of the apical impulse, regular rhythm, normal  first and second heart sounds, no murmurs, rubs or gallops Abdomen: no tenderness or distention, no masses by palpation, no abnormal pulsatility or arterial bruits, normal bowel sounds, no hepatosplenomegaly Extremities: no clubbing, cyanosis or edema; 2+ radial, ulnar and brachial pulses bilaterally; 2+ right femoral, posterior tibial and dorsalis pedis pulses; 2+ left femoral, posterior tibial and dorsalis pedis pulses; no subclavian or femoral bruits Neurological: grossly nonfocal Psych: euthymic mood, full affect   EKG:  EKG is not ordered today.   Recent Labs: 04/05/2015: TSH 0.59 04/14/2015: Hemoglobin 12.2; Platelets 297 04/28/2015: ALT 8; BUN 14; Creatinine, Ser 1.00; Potassium 4.4; Sodium 142    Lipid Panel    Component Value Date/Time   CHOL 166 04/28/2015 1042   TRIG 95.0 04/28/2015 1042   HDL 55.40 04/28/2015 1042   CHOLHDL 3 04/28/2015 1042   VLDL 19.0 04/28/2015 1042   LDLCALC 91 04/28/2015 1042      Wt Readings from Last 3 Encounters:  06/02/15 249 lb 11.2 oz (113.263 kg)  05/05/15 241 lb 8 oz (109.544 kg)  04/19/15 242 lb 12.8 oz (110.133 kg)      Other studies Reviewed: Additional studies/ records that were reviewed today include:  Emergency room visit September 29, office visit with PA October 4.   ASSESSMENT AND PLAN:  1. Paroxysmal atrial fibrillation. Embolic risk is high: CHADSVasc score is 5 (age, gender, HTN, DM), and may be even higher than estimated by that scored due to her history of possible hypercoagulable state. She should remain on lifelong anticoagulation, barring serious bleeding complications. The same medication will also help protect her from recurrent venous thromboembolic disease.  discussed the pros and cons of anticoagulation therapy, benefits and possible side effects, statistical risk of stroke if interrupted, how to manage bleeding complications and prepare for surgical procedures that involve bleeding risk  2.  Pulmonary artery  hypertension, presumably secondary to previous venous thromboembolic disease.  3.   Essential hypertension. Blood pressure mildly elevated today, but she appears uncomfortable due to her headache and wheezing. No changes are made to her medications.    Current medicines are reviewed at length with the patient today.  The patient does not have concerns regarding medicines.  The following changes have been made:  no change  Labs/ tests ordered today include:  No orders of the defined types were placed in this encounter.    Patient Instructions  Dr. Sallyanne Kuster recommends that you schedule a follow-up appointment in: Laurel Price, Laksh Hinners, MD  06/03/2015 5:45 PM    Alicia Klein, MD, [facility] [phone] office [phone] pager

## 2015-06-02 NOTE — Patient Instructions (Signed)
Dr. Croitoru recommends that you schedule a follow-up appointment in: 6 MONTHS   

## 2015-06-03 DIAGNOSIS — I480 Paroxysmal atrial fibrillation: Secondary | ICD-10-CM | POA: Insufficient documentation

## 2015-06-07 ENCOUNTER — Telehealth: Payer: Self-pay | Admitting: *Deleted

## 2015-06-07 ENCOUNTER — Other Ambulatory Visit: Payer: Self-pay | Admitting: *Deleted

## 2015-06-07 MED ORDER — LEVALBUTEROL HCL 1.25 MG/3ML IN NEBU: 1.2500 mg | INHALATION_SOLUTION | RESPIRATORY_TRACT | Status: DC | PRN

## 2015-06-07 MED ORDER — TIOTROPIUM BROMIDE MONOHYDRATE 1.25 MCG/ACT IN AERS: 2.0000 | INHALATION_SPRAY | Freq: Every day | RESPIRATORY_TRACT | Status: DC

## 2015-06-07 NOTE — Telephone Encounter (Signed)
Called and notified patient and sent in RX to patients pharmacy. Patient will call us back in a week to let us know how its working.

## 2015-06-07 NOTE — Telephone Encounter (Signed)
Please inform patient that we can try Spiriva 1.25 respimat two inhalations one time per day. She can let us know about response next week.

## 2015-06-08 ENCOUNTER — Encounter: Payer: Self-pay | Admitting: *Deleted

## 2015-06-08 ENCOUNTER — Other Ambulatory Visit: Payer: Self-pay | Admitting: Allergy and Immunology

## 2015-06-08 ENCOUNTER — Encounter: Payer: Medicare Other | Attending: Family Medicine | Admitting: *Deleted

## 2015-06-08 ENCOUNTER — Other Ambulatory Visit: Payer: Self-pay | Admitting: Neurology

## 2015-06-08 VITALS — Ht 65.0 in | Wt 251.9 lb

## 2015-06-08 DIAGNOSIS — E119 Type 2 diabetes mellitus without complications: Secondary | ICD-10-CM | POA: Diagnosis not present

## 2015-06-08 DIAGNOSIS — Z713 Dietary counseling and surveillance: Secondary | ICD-10-CM | POA: Diagnosis not present

## 2015-06-08 DIAGNOSIS — J4540 Moderate persistent asthma, uncomplicated: Secondary | ICD-10-CM

## 2015-06-08 MED ORDER — IPRATROPIUM BROMIDE 0.02 % IN SOLN: 0.5000 mg | Freq: Four times a day (QID) | RESPIRATORY_TRACT | Status: DC

## 2015-06-08 NOTE — Patient Instructions (Addendum)
Plan:  Aim for 2-3 Carb Choices per meal (30-45 grams) +/- 1 either way  Aim for 0-15 Carbs per snack if hungry  Get a To-Go box and put half of your meal in there for tomorrow Include protein in moderation with your meals and snacks Consider reading food labels for Total Carbohydrate and Fat Grams of foods Consider  increasing your activity level by riding your stationary bike for 30 minutes daily as tolerated to a goal of 150 minutes for week. Consider checking BG at alternate times per day to include fasting  And 2 hours after a meal and log in your new book,  Continue taking medication as directed by MD  Weight Loss goal #10 Bake rather than fry foods

## 2015-06-08 NOTE — Progress Notes (Signed)
Diabetes Self-Management Education  Visit Type: First/Initial  Appt. Start Time: 1400 Appt. End Time: [id number]  06/08/2015  Ms. [facility], identified by name and date of birth, is a 69 y.o. female with a diagnosis of Diabetes: Type 2. Ms. Tregre was seen many years ago for weight management. Today she returns for nutritional guidance related to her glucose.  ASSESSMENT  Height 5\' 5"  (1.651 m), weight 251 lb 14.4 oz (114.261 kg). Body mass index is 41.92 kg/(m^2).      Diabetes Self-Management Education - 06/08/15 1410    Visit Information   Visit Type First/Initial   Initial Visit   Diabetes Type Type 2   Are you currently following a meal plan? No   Are you taking your medications as prescribed? Yes   Date Diagnosed 2006   Health Coping   How would you rate your overall health? Fair   Psychosocial Assessment   Patient Belief/Attitude about Diabetes Motivated to manage diabetes   Self-care barriers None   Self-management support Doctor's office;CDE visits   Other persons present Patient   Patient Concerns Nutrition/Meal planning;Healthy Lifestyle;Weight Control;Glycemic Control   Special Needs None   Preferred Learning Style No preference indicated   Learning Readiness Ready   How often do you need to have someone help you when you read instructions, pamphlets, or other written materials from your doctor or pharmacy? 2 - Rarely   What is the last grade level you completed in school? business college   Complications   Last HgB A1C per patient/outside source 6.3 %   How often do you check your blood sugar? 1-2 times/day   Postprandial Blood glucose range (mg/dL) 130-179  today 152mg /dl   Have you had a dilated eye exam in the past 12 months? Yes   Have you had a dental exam in the past 12 months? Yes   Are you checking your feet? No   Dietary Intake   Breakfast 1:00pm:  oatmeal 1 1/2C , english muffin, sausage,1C milk (Lactaid)    Dinner 6:30pm  can of soup and crackers  / backed pork chop, mashed potatoes, gravy, corn bread, lemonade   Snack (evening)  Midnight: corn chips with melted cheddar, lemonade   Beverage(s) lemon flavored water, milk lemonade,  regular gingerAle, unsweet tea   Exercise   Exercise Type ADL's   Patient Education   Previous Diabetes Education No  for weight not diabetes   Physical activity and exercise  Role of exercise on diabetes management, blood pressure control and cardiac health.   Monitoring Purpose and frequency of SMBG.   Psychosocial adjustment Role of stress on diabetes   Individualized Goals (developed by patient)   Nutrition General guidelines for healthy choices and portions discussed   Physical Activity Exercise 5-7 days per week;30 minutes per day   Monitoring  test blood glucose pre and post meals as discussed   Outcomes   Expected Outcomes Demonstrated interest in learning. Expect positive outcomes   Future DMSE PRN   Program Status Completed      Individualized Plan for Diabetes Self-Management Training:   Learning Objective:  Patient will have a greater understanding of diabetes self-management. Patient education plan is to attend individual and/or group sessions per assessed needs and concerns.   Plan:   Patient Instructions  Plan:  Aim for 2-3 Carb Choices per meal (30-45 grams) +/- 1 either way  Aim for 0-15 Carbs per snack if hungry  Get a To-Go box and put half of your  meal in there for tomorrow Include protein in moderation with your meals and snacks Consider reading food labels for Total Carbohydrate and Fat Grams of foods Consider  increasing your activity level by riding your stationary bike for 30 minutes daily as tolerated to a goal of 150 minutes for week. Consider checking BG at alternate times per day to include fasting  And 2 hours after a meal and log in your new book,  Continue taking medication as directed by MD  Weight Loss goal #10 Bake rather than fry foods    Expected  Outcomes:  Demonstrated interest in learning. Expect positive outcomes  Education material provided: Living Well with Diabetes, A1C conversion sheet, Meal plan card, My Plate, Snack sheet and Support group flyer  If problems or questions, patient to contact team via:  Phone  Future DSME appointment: PRN

## 2015-06-14 ENCOUNTER — Ambulatory Visit (INDEPENDENT_AMBULATORY_CARE_PROVIDER_SITE_OTHER): Payer: Medicare Other

## 2015-06-14 DIAGNOSIS — J4540 Moderate persistent asthma, uncomplicated: Secondary | ICD-10-CM | POA: Diagnosis not present

## 2015-06-15 DIAGNOSIS — J4540 Moderate persistent asthma, uncomplicated: Secondary | ICD-10-CM | POA: Diagnosis not present

## 2015-06-17 ENCOUNTER — Telehealth: Payer: Self-pay | Admitting: Neurology

## 2015-06-17 NOTE — Telephone Encounter (Signed)
Changed pharmacy in epic system for patient.

## 2015-06-17 NOTE — Telephone Encounter (Signed)
[name] WILL BE USING [facility] ON [address] FOR ALL HER MEDICATIONS REFILLS NOW, NOT [facility]  THANKS

## 2015-06-23 ENCOUNTER — Other Ambulatory Visit: Payer: Self-pay | Admitting: Neurology

## 2015-06-23 ENCOUNTER — Other Ambulatory Visit: Payer: Self-pay

## 2015-06-23 DIAGNOSIS — J4540 Moderate persistent asthma, uncomplicated: Secondary | ICD-10-CM

## 2015-06-23 MED ORDER — ALBUTEROL SULFATE HFA 108 (90 BASE) MCG/ACT IN AERS: 2.0000 | INHALATION_SPRAY | Freq: Four times a day (QID) | RESPIRATORY_TRACT | Status: DC | PRN

## 2015-06-23 MED ORDER — FLUTICASONE PROPIONATE HFA 220 MCG/ACT IN AERO: 2.0000 | INHALATION_SPRAY | Freq: Two times a day (BID) | RESPIRATORY_TRACT | Status: DC

## 2015-06-23 NOTE — Telephone Encounter (Signed)
Patient also need flovent refilled.  Thanks

## 2015-06-23 NOTE — Telephone Encounter (Signed)
Sent in Proventil and patient notified.

## 2015-06-23 NOTE — Telephone Encounter (Signed)
Patient is needing her Proventil Inhaler filled to her new pharmacy.  [phone]  Please Advise  Thanks

## 2015-06-23 NOTE — Telephone Encounter (Signed)
Medication sent in and patient notified. 

## 2015-06-24 DIAGNOSIS — E118 Type 2 diabetes mellitus with unspecified complications: Secondary | ICD-10-CM | POA: Diagnosis not present

## 2015-07-02 ENCOUNTER — Telehealth: Payer: Self-pay | Admitting: Internal Medicine

## 2015-07-02 NOTE — Telephone Encounter (Signed)
Spoke to pt that she is on rivaroxaban 20mg  and today has only one pill. Pharmacy has filled rx but is closed tomorrow. She has 15mg  tab at home and wondering if she should take tomorrow and then have refill on Monday. I offered to call in rx to another pharmacy but she doesn't think her insurance will allow. I explained benefits and risks and told her that 15mg  dose better than nothing for tomorrow.  Raliegh Ip, MD MPH

## 2015-07-04 NOTE — Telephone Encounter (Signed)
Please make sure she now has new Rx

## 2015-07-05 ENCOUNTER — Telehealth: Payer: Self-pay

## 2015-07-05 ENCOUNTER — Ambulatory Visit: Payer: Medicare Other | Admitting: *Deleted

## 2015-07-05 NOTE — Telephone Encounter (Signed)
Received PA request, advised patient will start PA

## 2015-07-05 NOTE — Telephone Encounter (Signed)
Patient is having some issues with her pharmacy and the medication Pantoprazole 40Mg .  She uses [facility] in [place].  Please Advise  Thanks

## 2015-07-05 NOTE — Telephone Encounter (Signed)
Verified patient did get her xarelto 20 mg refilled.

## 2015-07-12 ENCOUNTER — Encounter: Payer: Self-pay | Admitting: Allergy and Immunology

## 2015-07-12 ENCOUNTER — Ambulatory Visit (INDEPENDENT_AMBULATORY_CARE_PROVIDER_SITE_OTHER): Payer: Medicare Other | Admitting: Allergy and Immunology

## 2015-07-12 ENCOUNTER — Ambulatory Visit: Payer: Medicare Other | Admitting: *Deleted

## 2015-07-12 VITALS — BP 124/80 | HR 80 | Temp 98.1°F | Resp 18

## 2015-07-12 DIAGNOSIS — E0840 Diabetes mellitus due to underlying condition with diabetic neuropathy, unspecified: Secondary | ICD-10-CM

## 2015-07-12 DIAGNOSIS — K219 Gastro-esophageal reflux disease without esophagitis: Secondary | ICD-10-CM

## 2015-07-12 DIAGNOSIS — J3089 Other allergic rhinitis: Secondary | ICD-10-CM | POA: Diagnosis not present

## 2015-07-12 DIAGNOSIS — J4541 Moderate persistent asthma with (acute) exacerbation: Secondary | ICD-10-CM | POA: Diagnosis not present

## 2015-07-12 DIAGNOSIS — J019 Acute sinusitis, unspecified: Secondary | ICD-10-CM | POA: Insufficient documentation

## 2015-07-12 DIAGNOSIS — I10 Essential (primary) hypertension: Secondary | ICD-10-CM

## 2015-07-12 DIAGNOSIS — J0100 Acute maxillary sinusitis, unspecified: Secondary | ICD-10-CM

## 2015-07-12 DIAGNOSIS — J4540 Moderate persistent asthma, uncomplicated: Secondary | ICD-10-CM | POA: Insufficient documentation

## 2015-07-12 MED ORDER — PREDNISONE 1 MG PO TABS: 10.0000 mg | ORAL_TABLET | ORAL | Status: DC

## 2015-07-12 MED ORDER — AZELASTINE HCL 0.1 % NA SOLN: NASAL | Status: DC

## 2015-07-12 NOTE — Progress Notes (Addendum)
RE: [name] MRN: [id number] DOB: 02/09/1946 [facility] [place] [id number] Date of Office Visit: 07/12/2015  Referring provider: [name], MD [address] [place], [place] [address]  History of present illness: HPI Comments: [name] is a 69 y.o. female with persistent asthma, allergic rhinitis, and gastroesophageal reflux disease presents today for sick visit.  She reports that over the past week she has experienced sinus pressure, nasal congestion, postnasal drainage, and sneezing.  She also complains of increased coughing, dyspnea, wheezing, and albuterol requirement.  She has not been using Dymista because she has had epistaxis while using this medication.  She has no reflux related complaints today.   Assessment and plan: Acute sinusitis  Prednisone has been provided, 20 mg x 4 days, 10 mg x1 day, then stop.  A prescription has been provided for azelastine nasal spray, 1-2 sprays per nostril 2 times daily as needed. Proper nasal spray technique has been discussed and demonstrated.   Nasal saline lavage (NeilMed) as needed has been recommended along with instructions for proper administration.  The patient has been asked to contact me if her symptoms persist, progress, or if she becomes febrile. Otherwise, she may return for follow up in 4 months.  Allergic rhinitis  A prescription has been provided for azelastine nasal spray (as above).  The patient has been asked to discontinue nasal steroid spray due to the side effect of epistaxis.  Continue appropriate allergen avoidance measures and montelukast 10 mg daily at bedtime.  Moderate persistent asthma  Prednisone has been provided (as above).  Continue current therapy consisting of Flovent 220, 2 inhalations twice a day, Spiriva, 2 inhalations daily, montelukast 10 mg daily at bedtime, and albuterol every 4-6 hours as  needed.  She will return for her Xolair injection next week.  GERD (gastroesophageal reflux disease)  Continue appropriate left thumb out of medications, pantoprazole 40 mg daily in the morning, and ranitidine 300 mg in the evening.  Diabetes mellitus with neuropathy  The patient has been asked to carefully monitor blood glucose levels while on prednisone.  She has verbalized understanding and has agreed to do so.  Essential hypertension, benign  [name] is to avoid systemic decongestants due to hypertension.    Medications ordered this encounter: Meds ordered this encounter  Medications  . azelastine (ASTELIN) 0.1 % nasal spray    Sig: USE 1-2 SPRAYS IN EACH NOSTRIL TWICE DAILY AS NEEDED    Dispense:  30 mL    Refill:  5  . predniSONE (DELTASONE) tablet 10 mg    Sig:     Diagnositics: Spirometry reveals FVC of 1.80 L and an FEV1 of 1.51 L (76% predicted) with significant (240 mL, 16%) post bronchodilator improvement.    Physical examination: Blood pressure 124/80, pulse 80, temperature 98.1 F (36.7 C), temperature source Oral, resp. rate 18.  General: Alert, interactive, in no acute distress. HEENT: TMs pearly gray, turbinates edematous with thick discharge, post-pharynx erythematous. Neck: Supple without lymphadenopathy. Lungs: Mildly decreased breath sounds bilaterally without wheezing, rhonchi or rales. CV: Normal S1, S2 without murmurs. Skin: Warm and dry, without lesions or rashes.  The following portions of the patient's history were reviewed and updated as appropriate: allergies, current medications, past family history, past medical history, past social history, past surgical history and problem list.  Outpatient medications:   Medication List       This list  is accurate as of: 07/12/15  6:17 PM.  Always use your most recent med list.               acetaminophen 325 MG tablet  Commonly known as:  TYLENOL  Take 650 mg by mouth every 6 (six) hours as  needed for moderate pain (pain). pain     acetaminophen-codeine 300-30 MG tablet  Commonly known as:  TYLENOL #3  TK 1 T PO TID PRN P     ALIGN 4 MG Caps  Take 1 capsule by mouth daily.     amLODipine 5 MG tablet  Commonly known as:  NORVASC  Take 5 mg by mouth daily.     azelastine 0.1 % nasal spray  Commonly known as:  ASTELIN  USE 1-2 SPRAYS IN EACH NOSTRIL TWICE DAILY AS NEEDED     BENADRYL PO  Take 1 tablet by mouth daily as needed (itching).     Biotin 1 MG Caps  Take 1 mg by mouth as needed (muscles).     cyclobenzaprine 10 MG tablet  Commonly known as:  FLEXERIL  Take 1 tablet (10 mg total) by mouth daily as needed for muscle spasms.     DAIRY-RELIEF PO  Take 1 capsule by mouth daily as needed (stomach upset).     diltiazem 120 MG 24 hr capsule  Commonly known as:  CARDIZEM CD  Take 1 capsule (120 mg total) by mouth daily.     DULoxetine 30 MG capsule  Commonly known as:  CYMBALTA  Take 30 mg by mouth daily.     DYMISTA 137-50 MCG/ACT Susp  Generic drug:  Azelastine-Fluticasone  Place 1 puff into the nose every other day.     EPIPEN 2-PAK 0.3 mg/0.3 mL Soaj injection  Generic drug:  EPINEPHrine  Inject 0.3 mg into the muscle as needed (ALLERGIC REACTION).     fluticasone 220 MCG/ACT inhaler  Commonly known as:  FLOVENT HFA  Inhale 2 puffs into the lungs 2 (two) times daily.     furosemide 40 MG tablet  Commonly known as:  LASIX  Take 1 tablet (40 mg total) by mouth daily.     gabapentin 300 MG capsule  Commonly known as:  NEURONTIN  Take 600 mg by mouth 3 (three) times daily.     Ginkgo Biloba 40 MG Tabs  Take 1 tablet by mouth 3 (three) times daily.     hydrOXYzine 25 MG capsule  Commonly known as:  VISTARIL  TAKE ONE CAPSULE BY MOUTH EVERY 8 HOURS AS NEEDED     ipratropium 0.02 % nebulizer solution  Commonly known as:  ATROVENT  Take 0.5 mg by nebulization 4 (four) times daily.     ipratropium 0.02 % nebulizer solution  Commonly known  as:  ATROVENT  Take 2.5 mLs (0.5 mg total) by nebulization 4 (four) times daily.     levalbuterol 1.25 MG/3ML nebulizer solution  Commonly known as:  XOPENEX  Take 1.25 mg by nebulization every 4 (four) hours as needed for wheezing.     Magnesium 250 MG Tabs  Take 1 tablet by mouth daily.     meloxicam 15 MG tablet  Commonly known as:  MOBIC  Take 15 mg by mouth daily.     metFORMIN 500 MG tablet  Commonly known as:  GLUCOPHAGE  TAKE 1 TABLET (500 MG TOTAL) BY MOUTH 2 (TWO) TIMES DAILY WITH A MEAL.     montelukast 10 MG tablet  Commonly known as:  SINGULAIR  Take 10 mg by mouth daily.     OMEGA 3-6-9 COMPLEX PO  Take 2 capsules by mouth daily.     ondansetron 4 MG disintegrating tablet  Commonly known as:  ZOFRAN ODT  Take 1 tablet (4 mg total) by mouth every 8 (eight) hours as needed for nausea or vomiting.     ONETOUCH DELICA LANCETS [id number] Misc  as needed (blood check).     ONETOUCH VERIO test strip  Generic drug:  glucose blood  1 each by Other route as needed (blood monitoring).     pantoprazole 40 MG tablet  Commonly known as:  PROTONIX  Take 1 tablet (40 mg total) by mouth daily.     PATADAY 0.2 % Soln  Generic drug:  Olopatadine HCl  Apply 1 drop to eye daily as needed (dry eyes).     Potassium Chloride CR 8 MEQ Cpcr capsule CR  Commonly known as:  MICRO-K  Take 8 mEq by mouth daily.     albuterol (2.5 MG/3ML) 0.083% nebulizer solution  Commonly known as:  PROVENTIL  Take 2.5 mg by nebulization every 4 (four) hours as needed for wheezing or shortness of breath.     PROAIR HFA 108 (90 Base) MCG/ACT inhaler  Generic drug:  albuterol  Inhale 2 puffs into the lungs every 6 (six) hours as needed for wheezing (wheezing). Shortness of breath     albuterol 108 (90 Base) MCG/ACT inhaler  Commonly known as:  PROVENTIL HFA  Inhale 2 puffs into the lungs every 6 (six) hours as needed for wheezing or shortness of breath.     ranitidine 300 MG capsule  Commonly  known as:  ZANTAC  Take 300 mg by mouth every evening.     rivaroxaban 20 MG Tabs tablet  Commonly known as:  XARELTO  Take 1 tablet (20 mg total) by mouth daily with supper.     sodium chloride 0.65 % Soln nasal spray  Commonly known as:  OCEAN  Place 2 sprays into both nostrils daily as needed for congestion (congestion).     SUMAtriptan 100 MG tablet  Commonly known as:  IMITREX  Take 100 mg by mouth as needed for migraine. May repeat in 2 hours if headache persists or recurs.     SUMAtriptan 100 MG tablet  Commonly known as:  IMITREX  Take 100 mg x 1 may, repeat in 2 hour for migraine.     Tiotropium Bromide Monohydrate 1.25 MCG/ACT Aers  Commonly known as:  SPIRIVA RESPIMAT  Inhale 2 puffs into the lungs daily.     traMADol 50 MG tablet  Commonly known as:  ULTRAM  Take by mouth every 6 (six) hours as needed for severe pain.     valsartan 160 MG tablet  Commonly known as:  DIOVAN  Take 1 tablet (160 mg total) by mouth daily.        Known medication allergies: Allergies  Allergen Reactions  . Lyrica [Pregabalin] Other (See Comments)    Blurred vision.  Marland Kitchen Penicillins     Has patient had a PCN reaction causing immediate rash, facial/tongue/throat swelling, SOB or lightheadedness with hypotension patient had a PCN reaction causing severe rash involving mucus membranes or skin necrosis: [id number] Has patient had a PCN reaction that required hospitalization/No Has patient had a PCN reaction occurring within the last 10 years: No If all of the above answers are "NO", then may proceed with Cephalosporin use.     Marland Kitchen Phenytoin Sodium Extended  Swelling   . Vicodin [Hydrocodone-Acetaminophen] Nausea And Vomiting   Review of systems: Constitutional: Negative for recurrent fevers, night sweats, or weight loss.  HENT: Negative for nosebleeds.   Positive for sinus pressure, nasal congestion, postnasal drainage. Eyes: Negative for blurred vision.  Respiratory:  Negative for hemoptysis.   Positive for coughing, wheezing. Cardiovascular: Negative for chest pain.  Gastrointestinal: Negative for diarrhea and constipation.  Genitourinary: Negative for dysuria.  Musculoskeletal: Negative for myalgias and joint pain.  Neurological: Negative for dizziness.  Endo/Heme/Allergies: Does not bruise/bleed easily.   Past Medical History  Diagnosis Date  . Hypertension   . DVT (deep venous thrombosis) ([place])   . Pulmonary embolism ([place])   . Diabetes mellitus   . Seizures ([place])   . Allergic rhinitis   . Chronic headache   . Pulmonary hypertension ([place])   . Dyspnea   . Asthma   . Arthritis   . Allergy   . Heart murmur     Family History  Problem Relation Age of Onset  . Hypertension Mother   . Clotting disorder Mother   . Breast cancer Mother   . Arthritis Mother   . Stroke Mother   . Diabetes Mother   . Allergies Other     grandson  . Cancer Brother   . Alcohol abuse Father   . Arthritis Sister   . Diabetes Sister   . Multiple sclerosis Sister     Social History   Social History  . Marital Status: Widowed    Spouse Name: N/A  . Number of Children: Y  . Years of Education: N/A   Occupational History  . retired Geologist, engineering.     Social History Main Topics  . Smoking status: Never Smoker   . Smokeless tobacco: Never Used  . Alcohol Use: 0.0 oz/week    0 Standard drinks or equivalent per week     Comment: occ glass on wine  . Drug Use: No  . Sexual Activity: Not on file   Other Topics Concern  . Not on file   Social History Narrative   Widow    limited exercise.    I appreciate the opportunity to take part in this Deshonna's care. Please do not hesitate to contact me with questions.  Sincerely,   R. Edgar Frisk, MD

## 2015-07-12 NOTE — Assessment & Plan Note (Addendum)
   Prednisone has been provided, 20 mg x 4 days, 10 mg x1 day, then stop.  A prescription has been provided for azelastine nasal spray, 1-2 sprays per nostril 2 times daily as needed. Proper nasal spray technique has been discussed and demonstrated.   Nasal saline lavage (NeilMed) as needed has been recommended along with instructions for proper administration.  The patient has been asked to contact me if her symptoms persist, progress, or if she becomes febrile. Otherwise, she may return for follow up in 4 months.

## 2015-07-12 NOTE — Patient Instructions (Addendum)
Acute sinusitis  Prednisone has been provided, 20 mg x 4 days, 10 mg x1 day, then stop.  A prescription has been provided for azelastine nasal spray, 1-2 sprays per nostril 2 times daily as needed. Proper nasal spray technique has been discussed and demonstrated.   Nasal saline lavage (NeilMed) as needed has been recommended along with instructions for proper administration.  The patient has been asked to contact me if her symptoms persist, progress, or if she becomes febrile. Otherwise, she may return for follow up in 4 months.  Allergic rhinitis  A prescription has been provided for azelastine nasal spray (as above).  The patient has been asked to discontinue nasal steroid spray due to the side effect of epistaxis.  Continue appropriate allergen avoidance measures and montelukast 10 mg daily at bedtime.  Moderate persistent asthma  Prednisone has been provided (as above).  Continue current therapy consisting of Flovent 220, 2 inhalations twice a day, Spiriva, 2 inhalations daily, montelukast 10 mg daily at bedtime, and albuterol every 4-6 hours as needed.  She will return for her Xolair injection next week.  GERD (gastroesophageal reflux disease)  Continue appropriate left thumb out of medications, pantoprazole 40 mg daily in the morning, and ranitidine 300 mg in the evening.  Diabetes mellitus with neuropathy  The patient has been asked to carefully monitor blood glucose levels while on prednisone.  She has verbalized understanding and has agreed to do so.  Essential hypertension, benign  Amra is to avoid systemic decongestants due to hypertension.    Return in about 4 months (around 11/10/2015), or if symptoms worsen or fail to improve.

## 2015-07-12 NOTE — Assessment & Plan Note (Addendum)
   Prednisone has been provided (as above).  Continue current therapy consisting of Flovent 220, 2 inhalations twice a day, Spiriva, 2 inhalations daily, montelukast 10 mg daily at bedtime, and albuterol every 4-6 hours as needed.  She will return for her Xolair injection next week.

## 2015-07-12 NOTE — Assessment & Plan Note (Signed)
   [name] is to avoid systemic decongestants due to hypertension.

## 2015-07-12 NOTE — Assessment & Plan Note (Addendum)
   The patient has been asked to carefully monitor blood glucose levels while on prednisone.  She has verbalized understanding and has agreed to do so. 

## 2015-07-12 NOTE — Assessment & Plan Note (Signed)
   Continue appropriate left thumb out of medications, pantoprazole 40 mg daily in the morning, and ranitidine 300 mg in the evening.

## 2015-07-12 NOTE — Assessment & Plan Note (Signed)
   A prescription has been provided for azelastine nasal spray (as above).  The patient has been asked to discontinue nasal steroid spray due to the side effect of epistaxis.  Continue appropriate allergen avoidance measures and montelukast 10 mg daily at bedtime.

## 2015-07-15 ENCOUNTER — Telehealth: Payer: Self-pay | Admitting: Allergy and Immunology

## 2015-07-15 NOTE — Telephone Encounter (Signed)
Pt called and said she seen you the other day and she is worse and wanted to see if you would call in an  Antibiotic.She was up last night coughing and her color she is coughing up is brown. [phone] [facility] in [place] on [address]

## 2015-07-18 ENCOUNTER — Encounter (HOSPITAL_COMMUNITY): Payer: Self-pay

## 2015-07-18 ENCOUNTER — Emergency Department (INDEPENDENT_AMBULATORY_CARE_PROVIDER_SITE_OTHER)
Admission: EM | Admit: 2015-07-18 | Discharge: 2015-07-18 | Disposition: A | Discharge status: Home / Self Care | Payer: Medicare Other | Source: Home / Self Care

## 2015-07-18 DIAGNOSIS — J0111 Acute recurrent frontal sinusitis: Secondary | ICD-10-CM

## 2015-07-18 MED ORDER — AZITHROMYCIN 250 MG PO TABS: ORAL_TABLET | ORAL | Status: DC

## 2015-07-18 NOTE — Discharge Instructions (Signed)

## 2015-07-18 NOTE — ED Notes (Signed)
Patient  Complains of having head congestion cough stuffy head Symptoms started about two weeks ago Denies any temperature

## 2015-07-18 NOTE — ED Provider Notes (Signed)
CSN: [id number]     Arrival date & time 07/18/15  1355 History   None    Chief Complaint  Patient presents with  . Nasal Congestion   HPI  69 ? Moderate persistent asthma, allergic rhinitis GERD Diabetes mellitus type 2 with neuropathy HTN prior DVT A. fib with RVR status post chemical cardioversion-Chad score 4 Primary pulmonary hypertension  Recently seen PCP office 12/27 treated for acute sinusitis prednisone, azelastine, nasal lavage with minimal relief but then started having productive sputum as well has felt worse over the past 1-2 days No chest pain although having some heaviness associated with coughing No rigors No chills No rash    Past Medical History  Diagnosis Date  . Hypertension   . DVT (deep venous thrombosis) ([place])   . Pulmonary embolism ([place])   . Diabetes mellitus   . Seizures ([place])   . Allergic rhinitis   . Chronic headache   . Pulmonary hypertension ([place])   . Dyspnea   . Asthma   . Arthritis   . Allergy   . Heart murmur    Past Surgical History  Procedure Laterality Date  . Knee surgery    . Plantar fascia surgery    . Partial hysterectomy    . Tonsillectomy    . Cardiac catheterization  12/28/2010    Mod. pulmonary hypertension, normal coronary arteries  . Doppler echocardiography  10/08/2011    EF=>55%,mild asymmetric LVH, mod. TR, mod. PH, mild to mod LA dilatation  . Nuclear stress test  05/20/2006    No ischemia  . Abdominal hysterectomy      partial, has ovaries  . Knee arthroscopy Left    Family History  Problem Relation Age of Onset  . Hypertension Mother   . Clotting disorder Mother   . Breast cancer Mother   . Arthritis Mother   . Stroke Mother   . Diabetes Mother   . Allergies Other     grandson  . Cancer Brother   . Alcohol abuse Father   . Arthritis Sister   . Diabetes Sister   . Multiple sclerosis Sister    Social History  Substance Use Topics  . Smoking status: Never Smoker   . Smokeless tobacco: Never Used  .  Alcohol Use: 0.0 oz/week    0 Standard drinks or equivalent per week     Comment: occ glass on wine   OB History    No data available     Review of Systems  No dysuria No dark stool No tarry stool Feels some tenderness to frontal sinuses bilaterally and discomfort  Allergies  Lyrica; Penicillins; Phenytoin sodium extended; and Vicodin  Home Medications   Prior to Admission medications   Medication Sig Start Date End Date Taking? Authorizing Provider  acetaminophen (TYLENOL) 325 MG tablet Take 650 mg by mouth every 6 (six) hours as needed for moderate pain (pain). pain    Historical Provider, MD  acetaminophen-codeine (TYLENOL #3) 300-30 MG tablet TK 1 T PO TID PRN P 07/07/15   Historical Provider, MD  albuterol (PROAIR HFA) 108 (90 BASE) MCG/ACT inhaler Inhale 2 puffs into the lungs every 6 (six) hours as needed for wheezing (wheezing). Shortness of breath    Historical Provider, MD  albuterol (PROVENTIL HFA) 108 (90 BASE) MCG/ACT inhaler Inhale 2 puffs into the lungs every 6 (six) hours as needed for wheezing or shortness of breath. 06/23/15   [name], MD  albuterol (PROVENTIL) (2.5 MG/3ML) 0.083% nebulizer solution Take  2.5 mg by nebulization every 4 (four) hours as needed for wheezing or shortness of breath.    Historical Provider, MD  amLODipine (NORVASC) 5 MG tablet Take 5 mg by mouth daily. 04/05/15   Historical Provider, MD  azelastine (ASTELIN) 0.1 % nasal spray USE 1-2 SPRAYS IN EACH NOSTRIL TWICE DAILY AS NEEDED 07/12/15   Adelina Mings, MD  Azelastine-Fluticasone [facility]) 137-50 MCG/ACT SUSP Place 1 puff into the nose every other day.     Historical Provider, MD  Biotin 1 MG CAPS Take 1 mg by mouth as needed (muscles).     Historical Provider, MD  cyclobenzaprine (FLEXERIL) 10 MG tablet Take 1 tablet (10 mg total) by mouth daily as needed for muscle spasms. 05/31/15   Amy Cletis Athens, MD  diltiazem (CARDIZEM CD) 120 MG 24 hr capsule Take 1 capsule (120 mg total) by  mouth daily. 05/09/15   Mihai Croitoru, MD  DiphenhydrAMINE HCl (BENADRYL PO) Take 1 tablet by mouth daily as needed (itching).     Historical Provider, MD  DULoxetine (CYMBALTA) 30 MG capsule Take 30 mg by mouth daily.    Historical Provider, MD  EPINEPHrine (EPIPEN 2-PAK) 0.3 mg/0.3 mL IJ SOAJ injection Inject 0.3 mg into the muscle as needed (ALLERGIC REACTION).     Historical Provider, MD  fluticasone (FLOVENT HFA) 220 MCG/ACT inhaler Inhale 2 puffs into the lungs 2 (two) times daily. 06/23/15   [name], MD  furosemide (LASIX) 40 MG tablet Take 1 tablet (40 mg total) by mouth daily. 01/27/15   Mihai Croitoru, MD  gabapentin (NEURONTIN) 300 MG capsule Take 600 mg by mouth 3 (three) times daily. 04/15/15   Historical Provider, MD  Ginkgo Biloba 40 MG TABS Take 1 tablet by mouth 3 (three) times daily.    Historical Provider, MD  hydrOXYzine (VISTARIL) 25 MG capsule TAKE ONE CAPSULE BY MOUTH EVERY 8 HOURS AS NEEDED 04/14/15   Historical Provider, MD  ipratropium (ATROVENT) 0.02 % nebulizer solution Take 0.5 mg by nebulization 4 (four) times daily.    Historical Provider, MD  ipratropium (ATROVENT) 0.02 % nebulizer solution Take 2.5 mLs (0.5 mg total) by nebulization 4 (four) times daily. 06/08/15   [name], MD  Lactase (DAIRY-RELIEF PO) Take 1 capsule by mouth daily as needed (stomach upset).     Historical Provider, MD  levalbuterol Penne Lash) 1.25 MG/3ML nebulizer solution Take 1.25 mg by nebulization every 4 (four) hours as needed for wheezing. 06/07/15   [name], MD  Magnesium 250 MG TABS Take 1 tablet by mouth daily.    Historical Provider, MD  meloxicam (MOBIC) 15 MG tablet Take 15 mg by mouth daily.    Historical Provider, MD  metFORMIN (GLUCOPHAGE) 500 MG tablet TAKE 1 TABLET (500 MG TOTAL) BY MOUTH 2 (TWO) TIMES DAILY WITH A MEAL. 03/28/15   Amy Cletis Athens, MD  montelukast (SINGULAIR) 10 MG tablet Take 10 mg by mouth daily.      Historical Provider, MD  Olopatadine HCl (PATADAY)  0.2 % SOLN Apply 1 drop to eye daily as needed (dry eyes).     Historical Provider, MD  Omega 3-6-9 Fatty Acids (OMEGA 3-6-9 COMPLEX PO) Take 2 capsules by mouth daily.    Historical Provider, MD  ondansetron (ZOFRAN ODT) 4 MG disintegrating tablet Take 1 tablet (4 mg total) by mouth every 8 (eight) hours as needed for nausea or vomiting. 05/19/14   Kaitlyn Szekalski, PA-C  ONETOUCH DELICA LANCETS 99991111 MISC as needed (blood check).  11/03/14   Historical Provider, MD  [name] VERIO test strip 1 each by Other route as needed (blood monitoring).  11/03/14   Historical Provider, MD  pantoprazole (PROTONIX) 40 MG tablet Take 1 tablet (40 mg total) by mouth daily. 05/30/15   [name], MD  Potassium Chloride CR (MICRO-K) 8 MEQ CPCR capsule CR Take 8 mEq by mouth daily.     Historical Provider, MD  Probiotic Product (ALIGN) 4 MG CAPS Take 1 capsule by mouth daily.    Historical Provider, MD  ranitidine (ZANTAC) 300 MG capsule Take 300 mg by mouth every evening.    Historical Provider, MD  rivaroxaban (XARELTO) 20 MG TABS tablet Take 1 tablet (20 mg total) by mouth daily with supper. 04/20/15   Burtis Junes, NP  sodium chloride (OCEAN) 0.65 % SOLN nasal spray Place 2 sprays into both nostrils daily as needed for congestion (congestion).     Historical Provider, MD  SUMAtriptan (IMITREX) 100 MG tablet Take 100 mg x 1 may, repeat in 2 hour for migraine. 08/24/14   Amy Cletis Athens, MD  SUMAtriptan (IMITREX) 100 MG tablet Take 100 mg by mouth as needed for migraine. May repeat in 2 hours if headache persists or recurs.    Historical Provider, MD  Tiotropium Bromide Monohydrate (SPIRIVA RESPIMAT) 1.25 MCG/ACT AERS Inhale 2 puffs into the lungs daily. 06/07/15   [name], MD  traMADol (ULTRAM) 50 MG tablet Take by mouth every 6 (six) hours as needed for severe pain.    Historical Provider, MD  valsartan (DIOVAN) 160 MG tablet Take 1 tablet (160 mg total) by mouth daily. 01/05/15   Mihai Croitoru, MD   Meds  Ordered and Administered this Visit  Medications - No data to display  BP 134/85 mmHg  Pulse 94  Temp(Src) 98.9 F (37.2 C) (Oral)  Resp 20  SpO2 98% No data found.   Physical Exam Alert obese pleasant Afro-American female no apparent distress  Significant tenderness to frontal sinuses No icterus no pallor Throat is clear No lymphadenopathy No JVD No bruit S1-S2 no murmur rub or gallop slightly tachycardic around 100 Abdomen soft nontender nondistended no rebound No lower extremity edema no pitting Range of motion intact moving all 4 limbs equally ambulatory   ED Course  Procedures (including critical care time)  Labs Review Labs Reviewed - No data to display  Imaging Review No results found.   Visual Acuity Review  Right Eye Distance:   Left Eye Distance:   Bilateral Distance:    Right Eye Near:   Left Eye Near:    Bilateral Near:         MDM  No diagnosis found. Probable acute bacterial sinusitis Differential diagnosis could be a bronchitis or a secondary involved bacterial pneumonia versusBronchitis I would not image at this stage is she's hemodynamically stable and I will be prescribing antibiotics for bacterial sinusitis which should clear up her other symptoms She tells me she's been to follow-up with her allergy in the asthma specialist in the next week or so and I think that is appropriate If her symptoms do not resolve she should get a chest x-ray We will prescribe for her because of her penicillin allergy we will prescribe azithromycin 2 tabs day 1 and then 1 tablet for 4 days to complete a course of treatment  Patient has been given warning signs for return to emergency room coating high fever chills and nonimprovement She understands clearly  Nita Sells, MD 07/18/15 1642

## 2015-07-27 ENCOUNTER — Ambulatory Visit: Payer: Medicare Other | Admitting: Allergy and Immunology

## 2015-08-02 ENCOUNTER — Ambulatory Visit (INDEPENDENT_AMBULATORY_CARE_PROVIDER_SITE_OTHER): Payer: Medicare Other | Admitting: Allergy and Immunology

## 2015-08-02 ENCOUNTER — Encounter: Payer: Self-pay | Admitting: Allergy and Immunology

## 2015-08-02 VITALS — BP 118/72 | HR 80 | Resp 20

## 2015-08-02 DIAGNOSIS — I480 Paroxysmal atrial fibrillation: Secondary | ICD-10-CM

## 2015-08-02 DIAGNOSIS — I272 Other secondary pulmonary hypertension: Secondary | ICD-10-CM

## 2015-08-02 DIAGNOSIS — I5189 Other ill-defined heart diseases: Secondary | ICD-10-CM

## 2015-08-02 DIAGNOSIS — I519 Heart disease, unspecified: Secondary | ICD-10-CM | POA: Diagnosis not present

## 2015-08-02 DIAGNOSIS — J3089 Other allergic rhinitis: Secondary | ICD-10-CM

## 2015-08-02 DIAGNOSIS — J0100 Acute maxillary sinusitis, unspecified: Secondary | ICD-10-CM

## 2015-08-02 DIAGNOSIS — J387 Other diseases of larynx: Secondary | ICD-10-CM | POA: Diagnosis not present

## 2015-08-02 DIAGNOSIS — J4540 Moderate persistent asthma, uncomplicated: Secondary | ICD-10-CM | POA: Diagnosis not present

## 2015-08-02 DIAGNOSIS — K219 Gastro-esophageal reflux disease without esophagitis: Secondary | ICD-10-CM

## 2015-08-02 MED ORDER — FLUTICASONE-SALMETEROL 230-21 MCG/ACT IN AERO: 2.0000 | INHALATION_SPRAY | Freq: Two times a day (BID) | RESPIRATORY_TRACT | Status: DC

## 2015-08-02 NOTE — Progress Notes (Signed)
[facility]  Follow-up Note  Referring Provider: [name], MD Primary Provider: [name], MD Date of Office Visit: 08/02/2015  Subjective:   [name] is a 70 y.o. female who returns to the [facility] in re-evaluation of the following:  HPI Comments: [name] returns to this clinic on 17 generally 2017 in reevaluation of her respiratory issues. Most recently she received a course of azithromycin for an episode of sinusitis which has responded relatively well. She had to discontinue her Dymista because it gave rise to bleeding and she was given a prescription for nasal azelastine but unfortunately recently lost the bottle. She still continues to have problems with coughing and wheezing. She's been using a bronchodilator just about every night before bed which does help his symptoms. In review, we removed her combination inhaler secondary to the fact that she had atrial fibrillation and placed her on monotherapy with Flovent 220 2 inhalations twice a day towards the tail end of last year. She does not appear to have been having any problems with her atrial fibrillation but her asthma has just not been well controlled since making that switch. She does continue to use Xolair on a consistent basis. She treats her reflux with a combination of pantoprazole and ranitidine which she believes is under relatively good control.   Current Outpatient Prescriptions on File Prior to Visit  Medication Sig Dispense Refill  . acetaminophen-codeine (TYLENOL #3) 300-30 MG tablet TK 1 T PO TID PRN P  0  . albuterol (PROVENTIL HFA) 108 (90 BASE) MCG/ACT inhaler Inhale 2 puffs into the lungs every 6 (six) hours as needed for wheezing or shortness of breath. 1 Inhaler 0  . albuterol (PROVENTIL) (2.5 MG/3ML) 0.083% nebulizer solution Take 2.5 mg by nebulization every 4 (four) hours as needed for wheezing or shortness of breath.    Marland Kitchen  amLODipine (NORVASC) 5 MG tablet Take 5 mg by mouth daily.  3  . azelastine (ASTELIN) 0.1 % nasal spray USE 1-2 SPRAYS IN EACH NOSTRIL TWICE DAILY AS NEEDED 30 mL 5  . Biotin 1 MG CAPS Take 1 mg by mouth as needed (muscles).     . cyclobenzaprine (FLEXERIL) 10 MG tablet Take 1 tablet (10 mg total) by mouth daily as needed for muscle spasms. 30 tablet 0  . diltiazem (CARDIZEM CD) 120 MG 24 hr capsule Take 1 capsule (120 mg total) by mouth daily. 90 capsule 1  . DiphenhydrAMINE HCl (BENADRYL PO) Take 1 tablet by mouth daily as needed (itching).     . DULoxetine (CYMBALTA) 30 MG capsule Take 30 mg by mouth daily.    Marland Kitchen EPINEPHrine (EPIPEN 2-PAK) 0.3 mg/0.3 mL IJ SOAJ injection Inject 0.3 mg into the muscle as needed (ALLERGIC REACTION).     . fluticasone (FLOVENT HFA) 220 MCG/ACT inhaler Inhale 2 puffs into the lungs 2 (two) times daily. 1 Inhaler 3  . furosemide (LASIX) 40 MG tablet Take 1 tablet (40 mg total) by mouth daily. 30 tablet 3  . gabapentin (NEURONTIN) 300 MG capsule Take 600 mg by mouth 3 (three) times daily.  6  . hydrOXYzine (VISTARIL) 25 MG capsule TAKE ONE CAPSULE BY MOUTH EVERY 8 HOURS AS NEEDED  3  . Lactase (DAIRY-RELIEF PO) Take 1 capsule by mouth daily as needed (stomach upset).     . Magnesium 250 MG TABS Take 1 tablet by mouth daily.    . meloxicam (MOBIC) 15 MG  tablet Take 15 mg by mouth daily.    . metFORMIN (GLUCOPHAGE) 500 MG tablet TAKE 1 TABLET (500 MG TOTAL) BY MOUTH 2 (TWO) TIMES DAILY WITH A MEAL. 60 tablet 2  . montelukast (SINGULAIR) 10 MG tablet Take 10 mg by mouth daily.      . Olopatadine HCl (PATADAY) 0.2 % SOLN Apply 1 drop to eye daily as needed (dry eyes).     . ondansetron (ZOFRAN ODT) 4 MG disintegrating tablet Take 1 tablet (4 mg total) by mouth every 8 (eight) hours as needed for nausea or vomiting. 10 tablet 0  . ONETOUCH DELICA LANCETS 99991111 MISC as needed (blood check).     [name] VERIO test strip 1 each by Other route as needed (blood monitoring).      . pantoprazole (PROTONIX) 40 MG tablet Take 1 tablet (40 mg total) by mouth daily. 30 tablet 5  . Potassium Chloride CR (MICRO-K) 8 MEQ CPCR capsule CR Take 8 mEq by mouth daily.     . Probiotic Product (ALIGN) 4 MG CAPS Take 1 capsule by mouth daily.    . ranitidine (ZANTAC) 300 MG capsule Take 300 mg by mouth every evening.    . rivaroxaban (XARELTO) 20 MG TABS tablet Take 1 tablet (20 mg total) by mouth daily with supper. 30 tablet 6  . sodium chloride (OCEAN) 0.65 % SOLN nasal spray Place 2 sprays into both nostrils daily as needed for congestion (congestion).     . SUMAtriptan (IMITREX) 100 MG tablet Take 100 mg x 1 may, repeat in 2 hour for migraine. 10 tablet 0  . Tiotropium Bromide Monohydrate (SPIRIVA RESPIMAT) 1.25 MCG/ACT AERS Inhale 2 puffs into the lungs daily. 1 Inhaler 3  . valsartan (DIOVAN) 160 MG tablet Take 1 tablet (160 mg total) by mouth daily. 30 tablet 6  . azithromycin (ZITHROMAX) 250 MG tablet Two tablets day one, then one tablet daily next 4 days. (Patient not taking: Reported on 08/02/2015) 6 tablet 0  . Ginkgo Biloba 40 MG TABS Take 1 tablet by mouth 3 (three) times daily. Reported on 08/02/2015    . Omega 3-6-9 Fatty Acids (OMEGA 3-6-9 COMPLEX PO) Take 2 capsules by mouth daily. Reported on 08/02/2015     Current Facility-Administered Medications on File Prior to Visit  Medication Dose Route Frequency Provider Last Rate Last Dose  . omalizumab Arvid Right) injection 300 mg  300 mg Subcutaneous Q28 days [name], MD   300 mg at 08/02/15 1439    Meds ordered this encounter  Medications  . fluticasone-salmeterol (ADVAIR HFA) 230-21 MCG/ACT inhaler    Sig: Inhale 2 puffs into the lungs 2 (two) times daily.    Dispense:  1 Inhaler    Refill:  5    Past Medical History  Diagnosis Date  . Hypertension   . DVT (deep venous thrombosis) ([place])   . Pulmonary embolism ([place])   . Diabetes mellitus   . Seizures ([place])   . Allergic rhinitis   . Chronic headache   .  Pulmonary hypertension ([place])   . Dyspnea   . Asthma   . Arthritis   . Allergy   . Heart murmur     Past Surgical History  Procedure Laterality Date  . Knee surgery    . Plantar fascia surgery    . Partial hysterectomy    . Tonsillectomy    . Cardiac catheterization  12/28/2010    Mod. pulmonary hypertension, normal coronary arteries  . Doppler echocardiography  10/08/2011    EF=>55%,mild asymmetric LVH, mod. TR, mod. PH, mild to mod LA dilatation  . Nuclear stress test  05/20/2006    No ischemia  . Abdominal hysterectomy      partial, has ovaries  . Knee arthroscopy Left     Allergies  Allergen Reactions  . Lyrica [Pregabalin] Other (See Comments)    Blurred vision.  Marland Kitchen Penicillins     Has patient had a PCN reaction causing immediate rash, facial/tongue/throat swelling, SOB or lightheadedness with hypotension patient had a PCN reaction causing severe rash involving mucus membranes or skin necrosis: [id number] Has patient had a PCN reaction that required hospitalization/No Has patient had a PCN reaction occurring within the last 10 years: No If all of the above answers are "NO", then may proceed with Cephalosporin use.     Marland Kitchen Phenytoin Sodium Extended     Swelling   . Vicodin [Hydrocodone-Acetaminophen] Nausea And Vomiting    Review of systems negative except as noted in HPI / PMHx or noted below:  Review of Systems  Constitutional: Negative.   HENT: Negative.   Eyes: Negative.   Respiratory: Negative.   Cardiovascular: Negative.   Gastrointestinal: Negative.   Genitourinary: Negative.   Musculoskeletal: Negative.   Skin: Negative.   Neurological: Negative.   Endo/Heme/Allergies: Negative.   Psychiatric/Behavioral: Negative.      Objective:   Filed Vitals:   08/02/15 1405  BP: 118/72  Pulse: 80  Resp: 20          Physical Exam  Constitutional: She is well-developed, well-nourished, and in no distress. No distress.  HENT:  Head: Normocephalic.   Right Ear: Tympanic membrane, external ear and ear canal normal.  Left Ear: Tympanic membrane, external ear and ear canal normal.  Nose: Nose normal. No mucosal edema or rhinorrhea.  Mouth/Throat: Uvula is midline, oropharynx is clear and moist and mucous membranes are normal. No oropharyngeal exudate.  Eyes: Conjunctivae are normal.  Neck: Trachea normal. No tracheal tenderness present. No tracheal deviation present. No thyromegaly present.  Cardiovascular: Normal rate, regular rhythm, S1 normal and S2 normal.   Murmur (early sytolic murmur) heard. Pulmonary/Chest: No stridor. No respiratory distress. She has wheezes (end expiratory wheezes). She has no rales.  Musculoskeletal: She exhibits no edema.  Lymphadenopathy:       Head (right side): No tonsillar adenopathy present.       Head (left side): No tonsillar adenopathy present.    She has no cervical adenopathy.    She has no axillary adenopathy.  Neurological: She is alert. Gait normal.  Skin: No rash noted. She is not diaphoretic. No erythema. Nails show no clubbing.  Psychiatric: Mood and affect normal.    Diagnostics:    Spirometry was performed and demonstrated an FEV1 of 1.50 at 76 % of predicted.  The patient had an Asthma Control Test with the following results: ACT Total Score: 8.    Assessment and Plan:   1. Asthma, moderate persistent, uncomplicated   2. Other allergic rhinitis   3. LPRD (laryngopharyngeal reflux disease)   4. History of pulmonary hypertension ([place])   5. History of diastolic dysfunction   6. History of paroxysmal atrial fibrillation with rapid ventricular response (HCC)     1. Start Advair 230 - 2 inhalations twice a day  2. Continue Flovent 220 2 inhalations twice a day for 1 week then one inhalation twice a day for 1 week then discontinue  3. Spiriva 2.5 respimat two inhalations one time per  day.  4. Continue Dymista one spray each nostril approximately 3-5 times per week or use nasal  azelastine two sprays each nostril 1-2 times per day. May also continue nasal saline washes.  5. Continue pantoprazole 40mg  in the AM and Ranitidine 300mg  in the PM  6. Continue Xolair and Epi-Pen  7. Use Provental HFA or albuterol nebulization if needed.  8. Return to clinic in 12 weeks or earlier if there is a problem.  I placed Shanise back on a combination inhaler with a long-acting bronchodilator and inhaled steroid. Although this may not be the best for her atrial fibrillation her asthma has not been well controlled using monotherapy and Xolair and we'll see what happens as we move forward over the course of the next 12 weeks. We will keep her on Flovent and Advair relatively high dose and then taper off her Flovent as we move forward. She'll continue on therapy for her reflux and her upper airway disease as well.  Allena Katz, MD [place]

## 2015-08-02 NOTE — Patient Instructions (Addendum)
  1. Start Advair 230 - 2 inhalations twice a day  2. Continue Flovent 220 2 inhalations twice a day for 1 week then one inhalation twice a day for 1 week then discontinue  3. Spiriva 2.5 respimat two inhalations one time per day.  4. Continue Dymista one spray each nostril approximately 3-5 times per week or use nasal azelastine two sprays each nostril 1-2 times per day. May also continue nasal saline washes.  5. Continue pantoprazole 40mg  in the AM and Ranitidine 300mg  in the PM  6. Continue Xolair and Epi-Pen  7. Use Provental HFA or albuterol nebulization if needed.  8. Return to clinic in 12 weeks or earlier if there is a problem.

## 2015-08-03 DIAGNOSIS — I272 Other secondary pulmonary hypertension: Secondary | ICD-10-CM | POA: Diagnosis not present

## 2015-08-03 DIAGNOSIS — J4540 Moderate persistent asthma, uncomplicated: Secondary | ICD-10-CM | POA: Diagnosis not present

## 2015-08-03 DIAGNOSIS — J387 Other diseases of larynx: Secondary | ICD-10-CM | POA: Diagnosis not present

## 2015-08-03 DIAGNOSIS — J3089 Other allergic rhinitis: Secondary | ICD-10-CM | POA: Diagnosis not present

## 2015-08-03 DIAGNOSIS — I519 Heart disease, unspecified: Secondary | ICD-10-CM | POA: Diagnosis not present

## 2015-08-08 DIAGNOSIS — E118 Type 2 diabetes mellitus with unspecified complications: Secondary | ICD-10-CM | POA: Diagnosis not present

## 2015-08-08 DIAGNOSIS — G629 Polyneuropathy, unspecified: Secondary | ICD-10-CM | POA: Diagnosis not present

## 2015-08-09 ENCOUNTER — Ambulatory Visit: Payer: Medicare Other | Admitting: Allergy and Immunology

## 2015-08-11 DIAGNOSIS — E118 Type 2 diabetes mellitus with unspecified complications: Secondary | ICD-10-CM | POA: Diagnosis not present

## 2015-08-11 DIAGNOSIS — I4891 Unspecified atrial fibrillation: Secondary | ICD-10-CM | POA: Diagnosis not present

## 2015-08-14 ENCOUNTER — Other Ambulatory Visit: Payer: Self-pay | Admitting: Cardiovascular Disease

## 2015-08-15 ENCOUNTER — Telehealth: Payer: Self-pay | Admitting: Cardiovascular Disease

## 2015-08-15 NOTE — Telephone Encounter (Signed)
Rx refill sent to pharmacy. 

## 2015-08-15 NOTE — Telephone Encounter (Signed)
Pt not c/o SOB - she is complaining of cramps, myalgias, particularly in hands. Started and persisted around 4-5 days ago. Asking if Xarelto a culprit.  Advised likely no - she has been on for several months w/ no problems, would be reasonable to look for any recent changes to meds.  She at first stated no recent changes, then made realization that her endocrinologist put her on Glyxambi 1 week ago.  Pt instructed to inquire w/ them regarding med and potential SE's. Joint pain listed as SE for this med.

## 2015-08-15 NOTE — Telephone Encounter (Signed)
New message    Patient calling wants to discuss something that's been happening with her.    Pt C/O Shortness Of Breath: STAT if SOB developed within the last 24 hours or pt is noticeably SOB on the phone  1. Are you currently SOB (can you hear that pt is SOB on the phone)? No   2. How long have you been experiencing SOB? A week now   3. Are you SOB when sitting or when up moving around?  Moving    4. Are you currently experiencing any other symptoms? Cramps in legs, and hands.

## 2015-08-18 DIAGNOSIS — M1711 Unilateral primary osteoarthritis, right knee: Secondary | ICD-10-CM | POA: Diagnosis not present

## 2015-08-18 DIAGNOSIS — M25561 Pain in right knee: Secondary | ICD-10-CM | POA: Diagnosis not present

## 2015-08-23 ENCOUNTER — Encounter: Payer: Self-pay | Admitting: Family Medicine

## 2015-08-23 ENCOUNTER — Ambulatory Visit (INDEPENDENT_AMBULATORY_CARE_PROVIDER_SITE_OTHER): Payer: Medicare Other | Admitting: Family Medicine

## 2015-08-23 ENCOUNTER — Telehealth: Payer: Self-pay | Admitting: Family Medicine

## 2015-08-23 ENCOUNTER — Other Ambulatory Visit: Payer: Self-pay | Admitting: Family Medicine

## 2015-08-23 VITALS — BP 124/60 | HR 90 | Temp 97.7°F | Ht 65.0 in | Wt 251.0 lb

## 2015-08-23 DIAGNOSIS — E0840 Diabetes mellitus due to underlying condition with diabetic neuropathy, unspecified: Secondary | ICD-10-CM

## 2015-08-23 DIAGNOSIS — R252 Cramp and spasm: Secondary | ICD-10-CM | POA: Diagnosis not present

## 2015-08-23 DIAGNOSIS — Z1159 Encounter for screening for other viral diseases: Secondary | ICD-10-CM | POA: Diagnosis not present

## 2015-08-23 LAB — COMPREHENSIVE METABOLIC PANEL
ALT: 11 U/L (ref 0–35)
AST: 13 U/L (ref 0–37)
Albumin: 3.6 g/dL (ref 3.5–5.2)
Alkaline Phosphatase: 80 U/L (ref 39–117)
BUN: 15 mg/dL (ref 6–23)
CO2: 31 mEq/L (ref 19–32)
Calcium: 9.5 mg/dL (ref 8.4–10.5)
Chloride: 105 mEq/L (ref 96–112)
Creatinine, Ser: 0.9 mg/dL (ref 0.40–1.20)
GFR: 79.82 mL/min (ref >60.00)
Glucose, Bld: 148 mg/dL — ABNORMAL HIGH (ref 70–99)
Potassium: 4.8 mEq/L (ref 3.5–5.1)
Sodium: 141 mEq/L (ref 135–145)
Total Bilirubin: 0.4 mg/dL (ref 0.2–1.2)
Total Protein: 6.6 g/dL (ref 6.0–8.3)

## 2015-08-23 LAB — CBC WITH DIFFERENTIAL/PLATELET
Basophils Absolute: 0 10*3/uL (ref 0.0–0.1)
Basophils Relative: 0.4 % (ref 0.0–3.0)
Eosinophils Absolute: 0.2 10*3/uL (ref 0.0–0.7)
Eosinophils Relative: 2.4 % (ref 0.0–5.0)
HCT: 38.1 % (ref 36.0–46.0)
Hemoglobin: 12.3 g/dL (ref 12.0–15.0)
Lymphocytes Relative: 34.9 % (ref 12.0–46.0)
Lymphs Abs: 2.6 10*3/uL (ref 0.7–4.0)
MCHC: 32.3 g/dL (ref 30.0–36.0)
MCV: 74.4 fl — ABNORMAL LOW (ref 78.0–100.0)
Monocytes Absolute: 0.6 10*3/uL (ref 0.1–1.0)
Monocytes Relative: 8.5 % (ref 3.0–12.0)
Neutro Abs: 4 10*3/uL (ref 1.4–7.7)
Neutrophils Relative %: 53.8 % (ref 43.0–77.0)
Platelets: 303 10*3/uL (ref 150.0–400.0)
RBC: 5.13 Mil/uL — ABNORMAL HIGH (ref 3.87–5.11)
RDW: 14.8 % (ref 11.5–15.5)
WBC: 7.4 10*3/uL (ref 4.0–10.5)

## 2015-08-23 LAB — TSH: TSH: 0.03 u[IU]/mL — ABNORMAL LOW (ref 0.35–4.50)

## 2015-08-23 LAB — T4, FREE: Free T4: 1.84 ng/dL — ABNORMAL HIGH (ref 0.60–1.60)

## 2015-08-23 NOTE — Assessment & Plan Note (Signed)
Worsened control per pt with reppotr of A1C 7.2. On new med unknown med. Will get last note from Dr. Wilson Singer and pt will call with name of new med. ? DM worsened control contributing to body cramping.  Reviewed low carb diet. Stop soda.

## 2015-08-23 NOTE — Patient Instructions (Signed)
Stop soda all together and change to water.  Stop at lab on way out. Call our office when you can to let us know the new med name.

## 2015-08-23 NOTE — Assessment & Plan Note (Signed)
?   Secondary to dehydration, ? worsened DM control, ? New med.  Send to lab to eval CMET, thyroid and  Cbc.

## 2015-08-23 NOTE — Progress Notes (Signed)
hand  Subjective:    Patient ID: JAZYAH WHEATON, female    DOB: [birth year], 69 y.o.   MRN: [id number]  HPI  70 year old female with history of diabetes, depression, DVT, HTN, neuropathy secondary to DM, paroxsymal afib and pulm hypertension presents with new onset  Hand, foot and leg cramping. She reports in the last 2-3 weeks off and on.  In entire leg when it happens, happening throughout the day. She has been having some soda off and on and wondered if it was from this. CBG last night at bedtime 127 after snack. She has been drinking more water lately, mouth has been dry.  She is on lasix, potassium and magnesium.  She continues to have numbness in feet bilaterally, no burning. Stable and tolerable for pt.  Previously well controlled.  Per pt A1C 1/19  7.2 at Dr. Wilson Singer.. Med added on 1/23 ? Name.  She stopped this med for a while to make sure cramping was not new SE, no change. She started back on this. Lab Results  Component Value Date   HGBA1C 6.3 04/28/2015   Tried mustard,tonic ward, baking soda no improvement.  Social History /Family History/Past Medical History reviewed and updated if needed.  Review of Systems  Constitutional: Negative for fever and fatigue.  HENT: Negative for ear pain.   Eyes: Negative for pain.  Respiratory: Negative for chest tightness and shortness of breath.   Cardiovascular: Negative for chest pain, palpitations and leg swelling.  Gastrointestinal: Negative for abdominal pain.  Genitourinary: Negative for dysuria.       Objective:   Physical Exam  Constitutional: Vital signs are normal. She appears well-developed and well-nourished. She is cooperative.  Non-toxic appearance. She does not appear ill. No distress.  obese  HENT:  Head: Normocephalic.  Right Ear: Hearing, tympanic membrane, external ear and ear canal normal. Tympanic membrane is not erythematous, not retracted and not bulging.  Left Ear: Hearing, tympanic membrane, external  ear and ear canal normal. Tympanic membrane is not erythematous, not retracted and not bulging.  Nose: No mucosal edema or rhinorrhea. Right sinus exhibits no maxillary sinus tenderness and no frontal sinus tenderness. Left sinus exhibits no maxillary sinus tenderness and no frontal sinus tenderness.  Mouth/Throat: Uvula is midline, oropharynx is clear and moist and mucous membranes are normal.  Eyes: Conjunctivae, EOM and lids are normal. Pupils are equal, round, and reactive to light. Lids are everted and swept, no foreign bodies found.  Neck: Trachea normal and normal range of motion. Neck supple. Carotid bruit is not present. No thyroid mass and no thyromegaly present.  Cardiovascular: Normal rate, regular rhythm, S1 normal, S2 normal, intact distal pulses and normal pulses.  Exam reveals no gallop and no friction rub.   Murmur heard.  Systolic murmur is present with a grade of 3/6   No diastolic murmur is present  Pulmonary/Chest: Effort normal and breath sounds normal. No tachypnea. No respiratory distress. She has no decreased breath sounds. She has no wheezes. She has no rhonchi. She has no rales.  Abdominal: Soft. Normal appearance and bowel sounds are normal. There is no tenderness.  Neurological: She is alert.  Skin: Skin is warm, dry and intact. No rash noted.  Psychiatric: Her speech is normal and behavior is normal. Judgment and thought content normal. Her mood appears not anxious. Cognition and memory are normal. She does not exhibit a depressed mood.          Assessment & Plan:

## 2015-08-23 NOTE — Telephone Encounter (Signed)
Mediation list updated.

## 2015-08-23 NOTE — Telephone Encounter (Signed)
Patient called back with name of medication-Glyxambi 10 mg/5mg .

## 2015-08-23 NOTE — Progress Notes (Signed)
Pre visit review using our clinic review tool, if applicable. No additional management support is needed unless otherwise documented below in the visit note. 

## 2015-08-24 LAB — HEPATITIS C ANTIBODY: HCV Ab: NEGATIVE

## 2015-08-25 ENCOUNTER — Encounter: Payer: Self-pay | Admitting: *Deleted

## 2015-08-25 ENCOUNTER — Telehealth: Payer: Self-pay | Admitting: Family Medicine

## 2015-08-25 NOTE — Telephone Encounter (Signed)
Patient returned Donna's call. °

## 2015-08-25 NOTE — Telephone Encounter (Signed)
Lab results discussed with patient.  She will call Dr. Eugenio Hoes office to schedule an appointment.  See Lab result note from 08/23/15.

## 2015-08-29 ENCOUNTER — Other Ambulatory Visit: Payer: Self-pay | Admitting: Allergy and Immunology

## 2015-08-30 ENCOUNTER — Ambulatory Visit (INDEPENDENT_AMBULATORY_CARE_PROVIDER_SITE_OTHER): Payer: Medicare Other

## 2015-08-30 DIAGNOSIS — J4540 Moderate persistent asthma, uncomplicated: Secondary | ICD-10-CM | POA: Diagnosis not present

## 2015-08-31 DIAGNOSIS — J4540 Moderate persistent asthma, uncomplicated: Secondary | ICD-10-CM | POA: Diagnosis not present

## 2015-09-01 ENCOUNTER — Telehealth: Payer: Self-pay

## 2015-09-01 ENCOUNTER — Other Ambulatory Visit: Payer: Self-pay | Admitting: *Deleted

## 2015-09-01 ENCOUNTER — Telehealth: Payer: Self-pay | Admitting: *Deleted

## 2015-09-01 MED ORDER — POTASSIUM CHLORIDE ER 8 MEQ PO CPCR: 8.0000 meq | ORAL_CAPSULE | Freq: Every day | ORAL | Status: DC

## 2015-09-01 NOTE — Telephone Encounter (Signed)
Pt would like a refill on potassium, I informed her we do not do that type of medication and that she will need to contact her primary to have them refill and keep a check on it.

## 2015-09-01 NOTE — Telephone Encounter (Signed)
Made in error

## 2015-09-05 DIAGNOSIS — E118 Type 2 diabetes mellitus with unspecified complications: Secondary | ICD-10-CM | POA: Diagnosis not present

## 2015-09-11 ENCOUNTER — Other Ambulatory Visit: Payer: Self-pay | Admitting: Cardiovascular Disease

## 2015-09-12 ENCOUNTER — Telehealth: Payer: Self-pay | Admitting: Cardiovascular Disease

## 2015-09-12 DIAGNOSIS — I10 Essential (primary) hypertension: Secondary | ICD-10-CM | POA: Diagnosis not present

## 2015-09-12 DIAGNOSIS — E559 Vitamin D deficiency, unspecified: Secondary | ICD-10-CM | POA: Diagnosis not present

## 2015-09-12 DIAGNOSIS — I482 Chronic atrial fibrillation: Secondary | ICD-10-CM | POA: Diagnosis not present

## 2015-09-12 NOTE — Telephone Encounter (Signed)
New message      Pt is on xarelto and is having some bleeding.  Please call

## 2015-09-12 NOTE — Telephone Encounter (Signed)
Patient advised to continue Xarelto.Advised to call back if continues to have nose bleeds.

## 2015-09-12 NOTE — Telephone Encounter (Signed)
Returned call to patient she stated she has had 2 nose bleeds in the past 1 week.Advised to use a humidifier,do not blow nose hard.Advised if she continues to have nose bleeds after using humidifier to call back.

## 2015-09-12 NOTE — Telephone Encounter (Signed)
Unfortunately, a common complaint with Xarelto. Does she have seasonal allergies?  Unless epistaxis is truly severe and requires emergency room evaluation, I would not stop the Xarelto.

## 2015-09-12 NOTE — Telephone Encounter (Signed)
REFILL 

## 2015-09-14 DIAGNOSIS — M25572 Pain in left ankle and joints of left foot: Secondary | ICD-10-CM | POA: Diagnosis not present

## 2015-09-14 DIAGNOSIS — M7752 Other enthesopathy of left foot: Secondary | ICD-10-CM | POA: Diagnosis not present

## 2015-09-19 DIAGNOSIS — M1711 Unilateral primary osteoarthritis, right knee: Secondary | ICD-10-CM | POA: Diagnosis not present

## 2015-09-22 DIAGNOSIS — Z803 Family history of malignant neoplasm of breast: Secondary | ICD-10-CM | POA: Diagnosis not present

## 2015-09-22 DIAGNOSIS — Z1231 Encounter for screening mammogram for malignant neoplasm of breast: Secondary | ICD-10-CM | POA: Diagnosis not present

## 2015-09-23 ENCOUNTER — Encounter: Payer: Self-pay | Admitting: Family Medicine

## 2015-09-26 DIAGNOSIS — M1711 Unilateral primary osteoarthritis, right knee: Secondary | ICD-10-CM | POA: Diagnosis not present

## 2015-09-27 ENCOUNTER — Other Ambulatory Visit: Payer: Self-pay

## 2015-09-27 ENCOUNTER — Telehealth: Payer: Self-pay | Admitting: Allergy and Immunology

## 2015-09-27 MED ORDER — MONTELUKAST SODIUM 10 MG PO TABS: 10.0000 mg | ORAL_TABLET | Freq: Every day | ORAL | Status: DC

## 2015-09-27 NOTE — Telephone Encounter (Signed)
Sent in refill

## 2015-09-27 NOTE — Telephone Encounter (Signed)
Pt call to get a new rx  Of montelukast  call to [facility] in [place] on [address].

## 2015-09-29 DIAGNOSIS — M9904 Segmental and somatic dysfunction of sacral region: Secondary | ICD-10-CM | POA: Diagnosis not present

## 2015-09-29 DIAGNOSIS — S338XXA Sprain of other parts of lumbar spine and pelvis, initial encounter: Secondary | ICD-10-CM | POA: Diagnosis not present

## 2015-09-29 DIAGNOSIS — S39012A Strain of muscle, fascia and tendon of lower back, initial encounter: Secondary | ICD-10-CM | POA: Diagnosis not present

## 2015-09-29 DIAGNOSIS — M5127 Other intervertebral disc displacement, lumbosacral region: Secondary | ICD-10-CM | POA: Diagnosis not present

## 2015-09-29 DIAGNOSIS — M9903 Segmental and somatic dysfunction of lumbar region: Secondary | ICD-10-CM | POA: Diagnosis not present

## 2015-09-29 DIAGNOSIS — M9905 Segmental and somatic dysfunction of pelvic region: Secondary | ICD-10-CM | POA: Diagnosis not present

## 2015-10-03 DIAGNOSIS — M9904 Segmental and somatic dysfunction of sacral region: Secondary | ICD-10-CM | POA: Diagnosis not present

## 2015-10-03 DIAGNOSIS — S338XXA Sprain of other parts of lumbar spine and pelvis, initial encounter: Secondary | ICD-10-CM | POA: Diagnosis not present

## 2015-10-03 DIAGNOSIS — S39012A Strain of muscle, fascia and tendon of lower back, initial encounter: Secondary | ICD-10-CM | POA: Diagnosis not present

## 2015-10-03 DIAGNOSIS — M9903 Segmental and somatic dysfunction of lumbar region: Secondary | ICD-10-CM | POA: Diagnosis not present

## 2015-10-03 DIAGNOSIS — M1711 Unilateral primary osteoarthritis, right knee: Secondary | ICD-10-CM | POA: Diagnosis not present

## 2015-10-03 DIAGNOSIS — M9905 Segmental and somatic dysfunction of pelvic region: Secondary | ICD-10-CM | POA: Diagnosis not present

## 2015-10-03 DIAGNOSIS — M5127 Other intervertebral disc displacement, lumbosacral region: Secondary | ICD-10-CM | POA: Diagnosis not present

## 2015-10-07 ENCOUNTER — Other Ambulatory Visit: Payer: Self-pay | Admitting: *Deleted

## 2015-10-07 ENCOUNTER — Ambulatory Visit (INDEPENDENT_AMBULATORY_CARE_PROVIDER_SITE_OTHER): Payer: Medicare Other | Admitting: Family Medicine

## 2015-10-07 ENCOUNTER — Telehealth: Payer: Self-pay | Admitting: Family Medicine

## 2015-10-07 VITALS — BP 122/66 | HR 109 | Temp 97.6°F | Wt 240.5 lb

## 2015-10-07 DIAGNOSIS — N760 Acute vaginitis: Secondary | ICD-10-CM

## 2015-10-07 MED ORDER — FLUCONAZOLE 150 MG PO TABS: 150.0000 mg | ORAL_TABLET | Freq: Once | ORAL | Status: DC

## 2015-10-07 NOTE — Progress Notes (Signed)
   Subjective:    Patient ID: [name], female    DOB: July 21, 1945, 70 y.o.   MRN: [id number]  HPI   70 year old female presents with new onset rash in vaginal area, ongoing x 1 week.  Soreness, itchy, no vaginal discharge. No fever, no abdominal pain, no N/V.  She has urinary frequency and incontinence chronically.  She has been wearing different brand depends.  Has tried Desitin.Marland Kitchen Has not helped.  No recent antibiotic, no sexual activity.  Social History /Family History/Past Medical History reviewed and updated if needed.  Review of Systems  Constitutional: Negative for fatigue.  HENT: Negative for ear pain.   Eyes: Negative for pain.  Respiratory: Negative for shortness of breath.   Cardiovascular: Negative for chest pain.       Objective:   Physical Exam  Constitutional: Vital signs are normal. She appears well-developed and well-nourished. She is cooperative.  Non-toxic appearance. She does not appear ill. No distress.  HENT:  Head: Normocephalic.  Right Ear: Hearing, tympanic membrane, external ear and ear canal normal. Tympanic membrane is not erythematous, not retracted and not bulging.  Left Ear: Hearing, tympanic membrane, external ear and ear canal normal. Tympanic membrane is not erythematous, not retracted and not bulging.  Nose: No mucosal edema or rhinorrhea. Right sinus exhibits no maxillary sinus tenderness and no frontal sinus tenderness. Left sinus exhibits no maxillary sinus tenderness and no frontal sinus tenderness.  Mouth/Throat: Uvula is midline, oropharynx is clear and moist and mucous membranes are normal.  Eyes: Conjunctivae, EOM and lids are normal. Pupils are equal, round, and reactive to light. Lids are everted and swept, no foreign bodies found.  Neck: Trachea normal and normal range of motion. Neck supple. Carotid bruit is not present. No thyroid mass and no thyromegaly present.  Cardiovascular: Normal rate, regular rhythm, S1 normal, S2  normal, normal heart sounds, intact distal pulses and normal pulses.  Exam reveals no gallop and no friction rub.   No murmur heard. Pulmonary/Chest: Effort normal and breath sounds normal. No tachypnea. No respiratory distress. She has no decreased breath sounds. She has no wheezes. She has no rhonchi. She has no rales.  Abdominal: Soft. Normal appearance and bowel sounds are normal. There is no tenderness.  Genitourinary: There is rash and tenderness on the right labia. There is no lesion on the right labia. There is rash and tenderness on the left labia. There is no lesion on the left labia. There is erythema and tenderness in the vagina. No vaginal discharge found.  Neurological: She is alert.  Skin: Skin is warm, dry and intact. No rash noted.  Psychiatric: Her speech is normal and behavior is normal. Judgment and thought content normal. Her mood appears not anxious. Cognition and memory are normal. She does not exhibit a depressed mood.          Assessment & Plan:

## 2015-10-07 NOTE — Patient Instructions (Signed)
Take diflucan x 1 . Use copious amount of Desitin for barrier cream.  Call if not improving as expected.

## 2015-10-07 NOTE — Assessment & Plan Note (Signed)
No discharge to send for wet prep. Will cover with diflcuan given appearance. Treat with barrier cream given contact derm from depends a  Likely possibility.

## 2015-10-07 NOTE — Telephone Encounter (Signed)
Change made.

## 2015-10-07 NOTE — Progress Notes (Signed)
Pre visit review using our clinic review tool, if applicable. No additional management support is needed unless otherwise documented below in the visit note. 

## 2015-10-07 NOTE — Telephone Encounter (Signed)
Pt wants to make sure that [facility] is taken off the list of her pharmacies.  She prefers [facility] in [place] then [place] as [facility]  Thanks

## 2015-10-10 DIAGNOSIS — M9905 Segmental and somatic dysfunction of pelvic region: Secondary | ICD-10-CM | POA: Diagnosis not present

## 2015-10-10 DIAGNOSIS — M5127 Other intervertebral disc displacement, lumbosacral region: Secondary | ICD-10-CM | POA: Diagnosis not present

## 2015-10-10 DIAGNOSIS — S39012A Strain of muscle, fascia and tendon of lower back, initial encounter: Secondary | ICD-10-CM | POA: Diagnosis not present

## 2015-10-10 DIAGNOSIS — S338XXA Sprain of other parts of lumbar spine and pelvis, initial encounter: Secondary | ICD-10-CM | POA: Diagnosis not present

## 2015-10-10 DIAGNOSIS — M9903 Segmental and somatic dysfunction of lumbar region: Secondary | ICD-10-CM | POA: Diagnosis not present

## 2015-10-10 DIAGNOSIS — M9904 Segmental and somatic dysfunction of sacral region: Secondary | ICD-10-CM | POA: Diagnosis not present

## 2015-10-13 DIAGNOSIS — M9905 Segmental and somatic dysfunction of pelvic region: Secondary | ICD-10-CM | POA: Diagnosis not present

## 2015-10-13 DIAGNOSIS — M9903 Segmental and somatic dysfunction of lumbar region: Secondary | ICD-10-CM | POA: Diagnosis not present

## 2015-10-13 DIAGNOSIS — M5127 Other intervertebral disc displacement, lumbosacral region: Secondary | ICD-10-CM | POA: Diagnosis not present

## 2015-10-13 DIAGNOSIS — M9904 Segmental and somatic dysfunction of sacral region: Secondary | ICD-10-CM | POA: Diagnosis not present

## 2015-10-13 DIAGNOSIS — S39012A Strain of muscle, fascia and tendon of lower back, initial encounter: Secondary | ICD-10-CM | POA: Diagnosis not present

## 2015-10-13 DIAGNOSIS — S338XXA Sprain of other parts of lumbar spine and pelvis, initial encounter: Secondary | ICD-10-CM | POA: Diagnosis not present

## 2015-10-19 DIAGNOSIS — M9904 Segmental and somatic dysfunction of sacral region: Secondary | ICD-10-CM | POA: Diagnosis not present

## 2015-10-19 DIAGNOSIS — S338XXA Sprain of other parts of lumbar spine and pelvis, initial encounter: Secondary | ICD-10-CM | POA: Diagnosis not present

## 2015-10-19 DIAGNOSIS — M9903 Segmental and somatic dysfunction of lumbar region: Secondary | ICD-10-CM | POA: Diagnosis not present

## 2015-10-19 DIAGNOSIS — M5127 Other intervertebral disc displacement, lumbosacral region: Secondary | ICD-10-CM | POA: Diagnosis not present

## 2015-10-19 DIAGNOSIS — M9905 Segmental and somatic dysfunction of pelvic region: Secondary | ICD-10-CM | POA: Diagnosis not present

## 2015-10-19 DIAGNOSIS — S39012A Strain of muscle, fascia and tendon of lower back, initial encounter: Secondary | ICD-10-CM | POA: Diagnosis not present

## 2015-10-20 DIAGNOSIS — M7751 Other enthesopathy of right foot: Secondary | ICD-10-CM | POA: Diagnosis not present

## 2015-10-20 DIAGNOSIS — M2011 Hallux valgus (acquired), right foot: Secondary | ICD-10-CM | POA: Diagnosis not present

## 2015-10-20 DIAGNOSIS — M2012 Hallux valgus (acquired), left foot: Secondary | ICD-10-CM | POA: Diagnosis not present

## 2015-10-20 DIAGNOSIS — M7752 Other enthesopathy of left foot: Secondary | ICD-10-CM | POA: Diagnosis not present

## 2015-10-20 DIAGNOSIS — M25572 Pain in left ankle and joints of left foot: Secondary | ICD-10-CM | POA: Diagnosis not present

## 2015-10-25 ENCOUNTER — Ambulatory Visit: Payer: Medicare Other | Admitting: Allergy and Immunology

## 2015-10-25 ENCOUNTER — Ambulatory Visit (INDEPENDENT_AMBULATORY_CARE_PROVIDER_SITE_OTHER): Payer: Medicare Other | Admitting: Allergy and Immunology

## 2015-10-25 ENCOUNTER — Encounter: Payer: Self-pay | Admitting: Allergy and Immunology

## 2015-10-25 VITALS — BP 124/76 | HR 80 | Resp 18

## 2015-10-25 DIAGNOSIS — J4540 Moderate persistent asthma, uncomplicated: Secondary | ICD-10-CM | POA: Diagnosis not present

## 2015-10-25 DIAGNOSIS — K219 Gastro-esophageal reflux disease without esophagitis: Secondary | ICD-10-CM

## 2015-10-25 DIAGNOSIS — J3089 Other allergic rhinitis: Secondary | ICD-10-CM | POA: Diagnosis not present

## 2015-10-25 NOTE — Patient Instructions (Addendum)
Asthma, moderate persistent Well-controlled on current regimen.  Continue omalizumab, Advair HFA 230-21 g, 2 inhalations twice a day, Spiriva Respimat, 2 inhalations daily, montelukast 10 mg daily bedtime, and albuterol HFA every 4-6 hours as needed.  Subjective and objective measures of pulmonary function will be followed and the treatment plan will be adjusted accordingly.  Allergic rhinitis  Continue appropriate allergen avoidance measures, azelastine nasal spray, and nasal saline irrigation as needed.  GERD (gastroesophageal reflux disease)  Continue appropriate reflux lifestyle modifications, pantoprazole 40 mg daily in the morning, and ranitidine 300 mg in the evening.    Return in about 4 months (around 02/24/2016), or if symptoms worsen or fail to improve.

## 2015-10-25 NOTE — Assessment & Plan Note (Signed)
   Continue appropriate allergen avoidance measures, azelastine nasal spray, and nasal saline irrigation as needed.

## 2015-10-25 NOTE — Assessment & Plan Note (Signed)
   Continue appropriate reflux lifestyle modifications, pantoprazole 40 mg daily in the morning, and ranitidine 300 mg in the evening.

## 2015-10-25 NOTE — Assessment & Plan Note (Addendum)
Well-controlled on current regimen.  If [name]'s subjective and objective measures of pulmonary function remain stable, we will consider stepping down therapy on the next visit.  For now, continue omalizumab, Advair HFA 230-21 g, 2 inhalations twice a day, Spiriva Respimat, 2 inhalations daily, montelukast 10 mg daily bedtime, and albuterol HFA every 4-6 hours as needed.  Subjective and objective measures of pulmonary function will be followed and the treatment plan will be adjusted accordingly.

## 2015-10-25 NOTE — Progress Notes (Signed)
Follow-up Note  RE: [name] MRN: [id number] DOB: January 17, 1946 Date of Office Visit: 10/25/2015  Primary care provider: [name], MD Referring provider: [name], MD  History of present illness: HPI Comments: [name] is a 70 y.o. female with moderate persistent asthma and omalizumab therapy, allergic rhinitis, laryngopharyngeal reflux disease who presents today for follow up.  She was last seen in this clinic on 08/02/2015.  She is currently taking omalizumab injections every month, Advair 230/21 g, 2 inhalations twice a day, and Spiriva Respimat, 2 inhalations daily.  She required albuterol rescue approximately 2 weeks ago due to a minor upper respiratory tract infection plus/minus pollen exposure, otherwise she has not required rescue medication, experienced nocturnal awakenings due to lower respiratory symptoms, nor have activities of daily living been limited.  She is using azelastine as needed and except for occasional mild epistaxis, she has no nasal symptom complaints today.  Her acid reflux is well-controlled with Protonix in morning and ranitidine in the evening.   Assessment and plan: Asthma, moderate persistent Well-controlled on current regimen.  If Keatyn's subjective and objective measures of pulmonary function remain stable, we will consider stepping down therapy on the next visit.  For now, continue omalizumab, Advair HFA 230-21 g, 2 inhalations twice a day, Spiriva Respimat, 2 inhalations daily, montelukast 10 mg daily bedtime, and albuterol HFA every 4-6 hours as needed.  Subjective and objective measures of pulmonary function will be followed and the treatment plan will be adjusted accordingly.  Allergic rhinitis  Continue appropriate allergen avoidance measures, azelastine nasal spray, and nasal saline irrigation as needed.  GERD (gastroesophageal reflux disease)  Continue appropriate reflux lifestyle modifications, pantoprazole 40 mg daily in  the morning, and ranitidine 300 mg in the evening.   Diagnositics: Spirometry:  Normal with an FEV1 of 85% predicted.  Please see scanned spirometry results for details.    Physical examination: Blood pressure 124/76, pulse 80, resp. rate 18.  General: Alert, interactive, in no acute distress. HEENT: TMs pearly gray, turbinates mildly edematous without discharge, post-pharynx moderately erythematous. Neck: Supple without lymphadenopathy. Lungs: Clear to auscultation without wheezing, rhonchi or rales. CV: Normal S1, S2 without murmurs. Skin: Warm and dry, without lesions or rashes.  The following portions of the patient's history were reviewed and updated as appropriate: allergies, current medications, past family history, past medical history, past social history, past surgical history and problem list.    Medication List       This list is accurate as of: 10/25/15  1:39 PM.  Always use your most recent med list.               acetaminophen-codeine 300-30 MG tablet  Commonly known as:  TYLENOL #3  TK 1 T PO TID PRN P     albuterol (2.5 MG/3ML) 0.083% nebulizer solution  Commonly known as:  PROVENTIL  Take 2.5 mg by nebulization every 4 (four) hours as needed for wheezing or shortness of breath.     albuterol 108 (90 Base) MCG/ACT inhaler  Commonly known as:  PROVENTIL HFA  Inhale 2 puffs into the lungs every 6 (six) hours as needed for wheezing or shortness of breath.     amLODipine 5 MG tablet  Commonly known as:  NORVASC  Take 5 mg by mouth daily.     azelastine 0.1 % nasal spray  Commonly known as:  ASTELIN  USE 1-2 SPRAYS IN EACH NOSTRIL TWICE DAILY AS NEEDED     BENADRYL PO  Take 1 tablet by mouth daily as needed (itching).     Biotin 1 MG Caps  Take 1 mg by mouth as needed (muscles).     cyclobenzaprine 10 MG tablet  Commonly known as:  FLEXERIL  Take 1 tablet (10 mg total) by mouth daily as needed for muscle spasms.     DAIRY-RELIEF PO  Take 1  capsule by mouth daily as needed (stomach upset).     diltiazem 120 MG 24 hr capsule  Commonly known as:  CARDIZEM CD  Take 1 capsule (120 mg total) by mouth daily.     DULoxetine 30 MG capsule  Commonly known as:  CYMBALTA  Take 30 mg by mouth daily.     EPIPEN 2-PAK 0.3 mg/0.3 mL Soaj injection  Generic drug:  EPINEPHrine  Inject 0.3 mg into the muscle as needed (ALLERGIC REACTION).     fluconazole 150 MG tablet  Commonly known as:  DIFLUCAN  Take 1 tablet (150 mg total) by mouth once.     fluticasone-salmeterol 230-21 MCG/ACT inhaler  Commonly known as:  ADVAIR HFA  Inhale 2 puffs into the lungs 2 (two) times daily.     furosemide 40 MG tablet  Commonly known as:  LASIX  Take 1 tablet (40 mg total) by mouth daily.     gabapentin 300 MG capsule  Commonly known as:  NEURONTIN  Take 600 mg by mouth 3 (three) times daily.     Ginkgo Biloba 40 MG Tabs  Take 1 tablet by mouth 3 (three) times daily. Reported on 08/02/2015     GLYXAMBI 10-5 MG Tabs  Generic drug:  Empagliflozin-Linagliptin  Take 1 tablet by mouth daily.     hydrOXYzine 25 MG capsule  Commonly known as:  VISTARIL  TAKE ONE CAPSULE BY MOUTH EVERY 8 HOURS AS NEEDED     Magnesium 250 MG Tabs  Take 1 tablet by mouth daily.     meloxicam 15 MG tablet  Commonly known as:  MOBIC  Take 15 mg by mouth daily.     metFORMIN 500 MG tablet  Commonly known as:  GLUCOPHAGE  TAKE 1 TABLET (500 MG TOTAL) BY MOUTH 2 (TWO) TIMES DAILY WITH A MEAL.     montelukast 10 MG tablet  Commonly known as:  SINGULAIR  Take 1 tablet (10 mg total) by mouth daily.     OMEGA 3-6-9 COMPLEX PO  Take 2 capsules by mouth daily. Reported on 08/02/2015     ondansetron 4 MG disintegrating tablet  Commonly known as:  ZOFRAN ODT  Take 1 tablet (4 mg total) by mouth every 8 (eight) hours as needed for nausea or vomiting.     ONETOUCH DELICA LANCETS 99991111 Misc  as needed (blood check).     ONETOUCH VERIO test strip  Generic drug:   glucose blood  1 each by Other route as needed (blood monitoring).     pantoprazole 40 MG tablet  Commonly known as:  PROTONIX  Take 1 tablet (40 mg total) by mouth daily.     PATADAY 0.2 % Soln  Generic drug:  Olopatadine HCl  Apply 1 drop to eye daily as needed (dry eyes).     Potassium Chloride CR 8 MEQ Cpcr capsule CR  Commonly known as:  MICRO-K  Take 1 capsule (8 mEq total) by mouth daily.     ranitidine 300 MG capsule  Commonly known as:  ZANTAC  Take 300 mg by mouth every evening.     rivaroxaban 20  MG Tabs tablet  Commonly known as:  XARELTO  Take 1 tablet (20 mg total) by mouth daily with supper.     sodium chloride 0.65 % Soln nasal spray  Commonly known as:  OCEAN  Place 2 sprays into both nostrils daily as needed for congestion (congestion).     SUMAtriptan 100 MG tablet  Commonly known as:  IMITREX  Take 100 mg x 1 may, repeat in 2 hour for migraine.     Tiotropium Bromide Monohydrate 1.25 MCG/ACT Aers  Commonly known as:  SPIRIVA RESPIMAT  Inhale 2 puffs into the lungs daily.     valsartan 160 MG tablet  Commonly known as:  DIOVAN  TAKE 1 TABLET BY MOUTH EVERY DAY        Allergies  Allergen Reactions  . Lyrica [Pregabalin] Other (See Comments)    Blurred vision.  Marland Kitchen Penicillins     Has patient had a PCN reaction causing immediate rash, facial/tongue/throat swelling, SOB or lightheadedness with hypotension patient had a PCN reaction causing severe rash involving mucus membranes or skin necrosis: [id number] Has patient had a PCN reaction that required hospitalization/No Has patient had a PCN reaction occurring within the last 10 years: No If all of the above answers are "NO", then may proceed with Cephalosporin use.     Marland Kitchen Phenytoin Sodium Extended     Swelling   . Vicodin [Hydrocodone-Acetaminophen] Nausea And Vomiting    I appreciate the opportunity to take part in this Senora's care. Please do not hesitate to contact me with  questions.  Sincerely,   R. Edgar Frisk, MD

## 2015-10-26 DIAGNOSIS — J4540 Moderate persistent asthma, uncomplicated: Secondary | ICD-10-CM | POA: Diagnosis not present

## 2015-10-26 DIAGNOSIS — K219 Gastro-esophageal reflux disease without esophagitis: Secondary | ICD-10-CM | POA: Diagnosis not present

## 2015-10-26 DIAGNOSIS — J3089 Other allergic rhinitis: Secondary | ICD-10-CM | POA: Diagnosis not present

## 2015-10-27 DIAGNOSIS — S338XXA Sprain of other parts of lumbar spine and pelvis, initial encounter: Secondary | ICD-10-CM | POA: Diagnosis not present

## 2015-10-27 DIAGNOSIS — S39012A Strain of muscle, fascia and tendon of lower back, initial encounter: Secondary | ICD-10-CM | POA: Diagnosis not present

## 2015-10-27 DIAGNOSIS — M9903 Segmental and somatic dysfunction of lumbar region: Secondary | ICD-10-CM | POA: Diagnosis not present

## 2015-10-27 DIAGNOSIS — M9904 Segmental and somatic dysfunction of sacral region: Secondary | ICD-10-CM | POA: Diagnosis not present

## 2015-10-27 DIAGNOSIS — M5127 Other intervertebral disc displacement, lumbosacral region: Secondary | ICD-10-CM | POA: Diagnosis not present

## 2015-10-27 DIAGNOSIS — M9905 Segmental and somatic dysfunction of pelvic region: Secondary | ICD-10-CM | POA: Diagnosis not present

## 2015-10-31 DIAGNOSIS — M9905 Segmental and somatic dysfunction of pelvic region: Secondary | ICD-10-CM | POA: Diagnosis not present

## 2015-10-31 DIAGNOSIS — E118 Type 2 diabetes mellitus with unspecified complications: Secondary | ICD-10-CM | POA: Diagnosis not present

## 2015-10-31 DIAGNOSIS — S338XXA Sprain of other parts of lumbar spine and pelvis, initial encounter: Secondary | ICD-10-CM | POA: Diagnosis not present

## 2015-10-31 DIAGNOSIS — M9904 Segmental and somatic dysfunction of sacral region: Secondary | ICD-10-CM | POA: Diagnosis not present

## 2015-10-31 DIAGNOSIS — M9903 Segmental and somatic dysfunction of lumbar region: Secondary | ICD-10-CM | POA: Diagnosis not present

## 2015-10-31 DIAGNOSIS — I10 Essential (primary) hypertension: Secondary | ICD-10-CM | POA: Diagnosis not present

## 2015-10-31 DIAGNOSIS — M5127 Other intervertebral disc displacement, lumbosacral region: Secondary | ICD-10-CM | POA: Diagnosis not present

## 2015-10-31 DIAGNOSIS — S39012A Strain of muscle, fascia and tendon of lower back, initial encounter: Secondary | ICD-10-CM | POA: Diagnosis not present

## 2015-11-03 DIAGNOSIS — M5127 Other intervertebral disc displacement, lumbosacral region: Secondary | ICD-10-CM | POA: Diagnosis not present

## 2015-11-03 DIAGNOSIS — M9903 Segmental and somatic dysfunction of lumbar region: Secondary | ICD-10-CM | POA: Diagnosis not present

## 2015-11-03 DIAGNOSIS — M25572 Pain in left ankle and joints of left foot: Secondary | ICD-10-CM | POA: Diagnosis not present

## 2015-11-03 DIAGNOSIS — S338XXA Sprain of other parts of lumbar spine and pelvis, initial encounter: Secondary | ICD-10-CM | POA: Diagnosis not present

## 2015-11-03 DIAGNOSIS — S39012A Strain of muscle, fascia and tendon of lower back, initial encounter: Secondary | ICD-10-CM | POA: Diagnosis not present

## 2015-11-03 DIAGNOSIS — M7752 Other enthesopathy of left foot: Secondary | ICD-10-CM | POA: Diagnosis not present

## 2015-11-03 DIAGNOSIS — M9905 Segmental and somatic dysfunction of pelvic region: Secondary | ICD-10-CM | POA: Diagnosis not present

## 2015-11-03 DIAGNOSIS — M9904 Segmental and somatic dysfunction of sacral region: Secondary | ICD-10-CM | POA: Diagnosis not present

## 2015-11-07 DIAGNOSIS — I10 Essential (primary) hypertension: Secondary | ICD-10-CM | POA: Diagnosis not present

## 2015-11-07 DIAGNOSIS — G629 Polyneuropathy, unspecified: Secondary | ICD-10-CM | POA: Diagnosis not present

## 2015-11-07 DIAGNOSIS — E118 Type 2 diabetes mellitus with unspecified complications: Secondary | ICD-10-CM | POA: Diagnosis not present

## 2015-11-14 ENCOUNTER — Other Ambulatory Visit: Payer: Self-pay | Admitting: Cardiovascular Disease

## 2015-11-15 NOTE — Telephone Encounter (Signed)
Rx request sent to pharmacy.  

## 2015-11-17 DIAGNOSIS — M71571 Other bursitis, not elsewhere classified, right ankle and foot: Secondary | ICD-10-CM | POA: Diagnosis not present

## 2015-11-17 DIAGNOSIS — M2041 Other hammer toe(s) (acquired), right foot: Secondary | ICD-10-CM | POA: Diagnosis not present

## 2015-11-21 DIAGNOSIS — M1711 Unilateral primary osteoarthritis, right knee: Secondary | ICD-10-CM | POA: Diagnosis not present

## 2015-12-01 DIAGNOSIS — L600 Ingrowing nail: Secondary | ICD-10-CM | POA: Diagnosis not present

## 2015-12-01 DIAGNOSIS — M71572 Other bursitis, not elsewhere classified, left ankle and foot: Secondary | ICD-10-CM | POA: Diagnosis not present

## 2015-12-08 DIAGNOSIS — E119 Type 2 diabetes mellitus without complications: Secondary | ICD-10-CM | POA: Diagnosis not present

## 2015-12-08 DIAGNOSIS — H40013 Open angle with borderline findings, low risk, bilateral: Secondary | ICD-10-CM | POA: Diagnosis not present

## 2015-12-08 DIAGNOSIS — H47329 Drusen of optic disc, unspecified eye: Secondary | ICD-10-CM | POA: Diagnosis not present

## 2015-12-08 DIAGNOSIS — H35031 Hypertensive retinopathy, right eye: Secondary | ICD-10-CM | POA: Diagnosis not present

## 2015-12-08 DIAGNOSIS — H35032 Hypertensive retinopathy, left eye: Secondary | ICD-10-CM | POA: Diagnosis not present

## 2015-12-08 LAB — HM DIABETES EYE EXAM

## 2015-12-09 ENCOUNTER — Ambulatory Visit (INDEPENDENT_AMBULATORY_CARE_PROVIDER_SITE_OTHER): Payer: Medicare Other | Admitting: Family Medicine

## 2015-12-09 ENCOUNTER — Encounter: Payer: Self-pay | Admitting: *Deleted

## 2015-12-09 ENCOUNTER — Other Ambulatory Visit: Payer: Self-pay | Admitting: Allergy and Immunology

## 2015-12-09 ENCOUNTER — Encounter: Payer: Self-pay | Admitting: Family Medicine

## 2015-12-09 VITALS — BP 122/70 | HR 91 | Temp 98.0°F | Wt 245.5 lb

## 2015-12-09 DIAGNOSIS — R5383 Other fatigue: Secondary | ICD-10-CM | POA: Diagnosis not present

## 2015-12-09 LAB — COMPREHENSIVE METABOLIC PANEL
ALT: 11 U/L (ref 0–35)
AST: 17 U/L (ref 0–37)
Albumin: 3.8 g/dL (ref 3.5–5.2)
Alkaline Phosphatase: 91 U/L (ref 39–117)
BUN: 15 mg/dL (ref 6–23)
CO2: 26 mEq/L (ref 19–32)
Calcium: 9.4 mg/dL (ref 8.4–10.5)
Chloride: 108 mEq/L (ref 96–112)
Creatinine, Ser: 0.93 mg/dL (ref 0.40–1.20)
GFR: 76.79 mL/min (ref >60.00)
Glucose, Bld: 141 mg/dL — ABNORMAL HIGH (ref 70–99)
Potassium: 4.7 mEq/L (ref 3.5–5.1)
Sodium: 140 mEq/L (ref 135–145)
Total Bilirubin: 0.7 mg/dL (ref 0.2–1.2)
Total Protein: 6.4 g/dL (ref 6.0–8.3)

## 2015-12-09 LAB — CBC WITH DIFFERENTIAL/PLATELET
Basophils Absolute: 0 10*3/uL (ref 0.0–0.1)
Basophils Relative: 0.5 % (ref 0.0–3.0)
Eosinophils Absolute: 0.3 10*3/uL (ref 0.0–0.7)
Eosinophils Relative: 3.5 % (ref 0.0–5.0)
HCT: 38.4 % (ref 36.0–46.0)
Hemoglobin: 12.5 g/dL (ref 12.0–15.0)
Lymphocytes Relative: 39.8 % (ref 12.0–46.0)
Lymphs Abs: 3.2 10*3/uL (ref 0.7–4.0)
MCHC: 32.5 g/dL (ref 30.0–36.0)
MCV: 70.8 fl — ABNORMAL LOW (ref 78.0–100.0)
Monocytes Absolute: 0.7 10*3/uL (ref 0.1–1.0)
Monocytes Relative: 8.3 % (ref 3.0–12.0)
Neutro Abs: 3.8 10*3/uL (ref 1.4–7.7)
Neutrophils Relative %: 47.9 % (ref 43.0–77.0)
Platelets: 322 10*3/uL (ref 150.0–400.0)
RBC: 5.42 Mil/uL — ABNORMAL HIGH (ref 3.87–5.11)
RDW: 17.2 % — ABNORMAL HIGH (ref 11.5–15.5)
WBC: 8 10*3/uL (ref 4.0–10.5)

## 2015-12-09 LAB — VITAMIN D 25 HYDROXY (VIT D DEFICIENCY, FRACTURES): VITD: 31.31 ng/mL (ref 30.00–100.00)

## 2015-12-09 LAB — TSH: TSH: 0.06 u[IU]/mL — ABNORMAL LOW (ref 0.35–4.50)

## 2015-12-09 LAB — T4, FREE: Free T4: 1.45 ng/dL (ref 0.60–1.60)

## 2015-12-09 LAB — T3, FREE: T3, Free: 3.9 pg/mL (ref 2.3–4.2)

## 2015-12-09 LAB — VITAMIN B12: Vitamin B-12: 1084 pg/mL — ABNORMAL HIGH (ref 211–911)

## 2015-12-09 NOTE — Assessment & Plan Note (Signed)
No clear cause except deconditioning. Has had sleep eval in past.  Will have her work on healthy lifestyle and eval with labs.  Also stop hydroxyzine and benadryl as may be contributing. Take cymbalta at night.

## 2015-12-09 NOTE — Patient Instructions (Addendum)
Stop hydroxyzine and benadryl. Take cymbalta at night.  Work on [employer]. Start regular exercise, walking. Stop at lab on way out.

## 2015-12-09 NOTE — Progress Notes (Signed)
Pre visit review using our clinic review tool, if applicable. No additional management support is needed unless otherwise documented below in the visit note. 

## 2015-12-09 NOTE — Progress Notes (Signed)
   Subjective:    Patient ID: [name], female    DOB: 01-13-1946, 70 y.o.   MRN: [id number]  HPIeula   70 year old female with history fo DM, depression, asthma, HTN paroxsysmal afib presents with fatigue in last 3-4 weeks. No CP, no SOB, no wheeze.No temperature instability, occ hot flashes.  No new med changes except now on supplements.Marland Kitchen ginko biloba, hyanlouric acid,   osteogenix for knee. Has helped with her knee. She has polypharmacy on multiple meds.  She does take hydroxizine daily and occ benadryl.  She is sleeping 6 hours a night. No snore, no known sleep apnea. Has had nml sleep apnea test in past.. Few years back.  Depression, good control on cymbalta.   Minimal exercise.    No known bleeding except occ bloody nose or blood in sputum off and on. On  xarelto.    Social History /Family History/Past Medical History reviewed and updated if needed.   Review of Systems  Constitutional: Negative for fever and fatigue.  HENT: Negative for ear pain.   Eyes: Negative for pain.  Respiratory: Negative for chest tightness and shortness of breath.   Cardiovascular: Negative for chest pain, palpitations and leg swelling.  Gastrointestinal: Negative for abdominal pain.  Genitourinary: Negative for dysuria.       Objective:   Physical Exam  Constitutional: Vital signs are normal. She appears well-developed and well-nourished. She is cooperative.  Non-toxic appearance. She does not appear ill. No distress.  overweight  HENT:  Head: Normocephalic.  Right Ear: Hearing, tympanic membrane, external ear and ear canal normal.  Left Ear: Hearing, tympanic membrane, external ear and ear canal normal.  Nose: Nose normal.  Eyes: Conjunctivae, EOM and lids are normal. Pupils are equal, round, and reactive to light. Lids are everted and swept, no foreign bodies found.  Neck: Trachea normal and normal range of motion. Neck supple. Carotid bruit is not present. No thyroid mass and  no thyromegaly present.  Cardiovascular: Normal rate, regular rhythm, S1 normal, S2 normal, normal heart sounds and intact distal pulses.  Exam reveals no gallop.   No murmur heard. No peripheral edema  Pulmonary/Chest: Effort normal and breath sounds normal. No respiratory distress. She has no wheezes. She has no rhonchi. She has no rales.  Abdominal: Soft. Normal appearance and bowel sounds are normal. She exhibits no distension, no fluid wave, no abdominal bruit and no mass. There is no hepatosplenomegaly. There is no tenderness. There is no rebound, no guarding and no CVA tenderness. No hernia.  Lymphadenopathy:    She has no cervical adenopathy.    She has no axillary adenopathy.  Neurological: She is alert. She has normal strength. No cranial nerve deficit or sensory deficit.  Skin: Skin is warm, dry and intact. No rash noted.  Psychiatric: Her speech is normal and behavior is normal. Judgment normal. Her mood appears not anxious. Cognition and memory are normal. She does not exhibit a depressed mood.          Assessment & Plan:

## 2015-12-13 ENCOUNTER — Encounter: Payer: Self-pay | Admitting: Family Medicine

## 2015-12-19 ENCOUNTER — Telehealth: Payer: Self-pay

## 2015-12-19 NOTE — Telephone Encounter (Signed)
Called and answered pt questions regarding meds she is using Levalbuterol but has duoneb (2) told her if taking Spiriva just use the one withour ipratropium. Pt understood

## 2015-12-19 NOTE — Telephone Encounter (Signed)
Patient called and has a few questions about how many meds she is supposed to use in her nebulizer machine.   Please Advise

## 2015-12-26 ENCOUNTER — Other Ambulatory Visit: Payer: Self-pay | Admitting: Allergy and Immunology

## 2016-01-02 ENCOUNTER — Other Ambulatory Visit: Payer: Self-pay | Admitting: Allergy and Immunology

## 2016-01-05 DIAGNOSIS — M5417 Radiculopathy, lumbosacral region: Secondary | ICD-10-CM | POA: Diagnosis not present

## 2016-01-05 DIAGNOSIS — G40209 Localization-related (focal) (partial) symptomatic epilepsy and epileptic syndromes with complex partial seizures, not intractable, without status epilepticus: Secondary | ICD-10-CM | POA: Diagnosis not present

## 2016-01-05 DIAGNOSIS — M79671 Pain in right foot: Secondary | ICD-10-CM | POA: Diagnosis not present

## 2016-01-05 DIAGNOSIS — R201 Hypoesthesia of skin: Secondary | ICD-10-CM | POA: Diagnosis not present

## 2016-01-05 DIAGNOSIS — G603 Idiopathic progressive neuropathy: Secondary | ICD-10-CM | POA: Diagnosis not present

## 2016-01-05 DIAGNOSIS — G5603 Carpal tunnel syndrome, bilateral upper limbs: Secondary | ICD-10-CM | POA: Diagnosis not present

## 2016-01-05 DIAGNOSIS — M5412 Radiculopathy, cervical region: Secondary | ICD-10-CM | POA: Diagnosis not present

## 2016-01-05 DIAGNOSIS — G5623 Lesion of ulnar nerve, bilateral upper limbs: Secondary | ICD-10-CM | POA: Diagnosis not present

## 2016-01-11 ENCOUNTER — Other Ambulatory Visit: Payer: Self-pay | Admitting: Allergy and Immunology

## 2016-01-27 DIAGNOSIS — E118 Type 2 diabetes mellitus with unspecified complications: Secondary | ICD-10-CM | POA: Diagnosis not present

## 2016-01-27 DIAGNOSIS — I10 Essential (primary) hypertension: Secondary | ICD-10-CM | POA: Diagnosis not present

## 2016-01-27 LAB — HEMOGLOBIN A1C: Hemoglobin A1C: 6.1

## 2016-02-06 DIAGNOSIS — M549 Dorsalgia, unspecified: Secondary | ICD-10-CM | POA: Diagnosis not present

## 2016-02-06 DIAGNOSIS — E118 Type 2 diabetes mellitus with unspecified complications: Secondary | ICD-10-CM | POA: Diagnosis not present

## 2016-02-06 DIAGNOSIS — M25569 Pain in unspecified knee: Secondary | ICD-10-CM | POA: Diagnosis not present

## 2016-02-19 ENCOUNTER — Other Ambulatory Visit: Payer: Self-pay | Admitting: Allergy and Immunology

## 2016-02-28 ENCOUNTER — Encounter (INDEPENDENT_AMBULATORY_CARE_PROVIDER_SITE_OTHER): Payer: Self-pay

## 2016-02-28 ENCOUNTER — Encounter: Payer: Self-pay | Admitting: Allergy and Immunology

## 2016-02-28 ENCOUNTER — Ambulatory Visit (INDEPENDENT_AMBULATORY_CARE_PROVIDER_SITE_OTHER): Payer: Medicare Other | Admitting: Allergy and Immunology

## 2016-02-28 VITALS — BP 114/68 | HR 80 | Resp 18

## 2016-02-28 DIAGNOSIS — J387 Other diseases of larynx: Secondary | ICD-10-CM | POA: Diagnosis not present

## 2016-02-28 DIAGNOSIS — I272 Other secondary pulmonary hypertension: Secondary | ICD-10-CM

## 2016-02-28 DIAGNOSIS — J4540 Moderate persistent asthma, uncomplicated: Secondary | ICD-10-CM

## 2016-02-28 DIAGNOSIS — I5189 Other ill-defined heart diseases: Secondary | ICD-10-CM

## 2016-02-28 DIAGNOSIS — I519 Heart disease, unspecified: Secondary | ICD-10-CM

## 2016-02-28 DIAGNOSIS — K219 Gastro-esophageal reflux disease without esophagitis: Secondary | ICD-10-CM

## 2016-02-28 DIAGNOSIS — J3089 Other allergic rhinitis: Secondary | ICD-10-CM | POA: Diagnosis not present

## 2016-02-28 NOTE — Patient Instructions (Addendum)
  1. Continue Advair 230 - 2 inhalations twice a day  2. Continue Spiriva 2.5 respimat two inhalations one time per day.  4. Continue Nasal azelastine two sprays each nostril 1-2 times per day if needed.  5. Continue pantoprazole 40mg  in the AM and Ranitidine 300mg  in the PM  6. Continue montelukast 10 mg one tablet one time per day  7. Restart Xolair and Epi-Pen  8. Use Provental HFA or albuterol nebulization and antihistamine if needed.  9. Return to clinic in 6 months or earlier if there is a problem.  10. Obtain fall flu vaccine

## 2016-02-28 NOTE — Progress Notes (Signed)
Follow-up Note  Referring Provider: [name], MD Primary Provider: [name], MD Date of Office Visit: 02/28/2016  Subjective:   [name] (DOB: Nov 02, 1945) is a 70 y.o. female who returns to the [facility] [place] on 02/28/2016 in re-evaluation of the following:  HPI: Brenette presents to this clinic in reevaluation of her multifaceted airway condition which includes asthma and pulmonary hypertension and a component of allergic rhinitis as well as diastolic dysfunction and reflux-induced respiratory disease. I've not seen her in his clinic since January 2017.  During the interval she has had 1 exacerbation of her asthma in April 2017 that apparently did not require the administration of systemic steroids. She has continued to use a large combination of medical therapy including Advair and Spiriva and montelukast and while doing so rarely uses a short acting bronchodilator. She has been a little bit more active lately and she has found that she can exercise a little bit more. She has not had any nocturnal bronchospastic symptoms. She has been without her Xolair for the past 2 months.  She believes that her reflux and the issue with her throat are going quite well. She continues to use a combination of a proton pump inhibitor and a H2 receptor blocker.  She remains anticoagulated for her history of pulmonary hypertension and pulmonary embolus and her hypertension and diastolic dysfunction appear to be under excellent control with her current medical therapy which appears to include a calcium channel blocker and a ARB.    Medication List      acetaminophen-codeine 300-30 MG tablet Commonly known as:  TYLENOL #3 TK 1 T PO TID PRN P   albuterol (2.5 MG/3ML) 0.083% nebulizer solution Commonly known as:  PROVENTIL Take 2.5 mg by nebulization every 4 (four) hours as needed for wheezing or shortness of breath.   albuterol 108 (90 Base) MCG/ACT inhaler Commonly known  as:  PROVENTIL HFA Inhale 2 puffs into the lungs every 6 (six) hours as needed for wheezing or shortness of breath.   amLODipine 5 MG tablet Commonly known as:  NORVASC Take 5 mg by mouth daily.   azelastine 0.1 % nasal spray Commonly known as:  ASTELIN USE 1-2 SPRAYS IN EACH NOSTRIL TWICE DAILY AS NEEDED   BENADRYL PO Take 1 tablet by mouth daily as needed (itching).   Biotin 1 MG Caps Take 1 mg by mouth as needed (muscles).   CARTIA XT 120 MG 24 hr capsule Generic drug:  diltiazem TAKE 1 CAPSULE BY MOUTH EVERY DAY   cyclobenzaprine 10 MG tablet Commonly known as:  FLEXERIL Take 1 tablet (10 mg total) by mouth daily as needed for muscle spasms.   DAIRY-RELIEF PO Take 1 capsule by mouth daily as needed (stomach upset).   DULoxetine 30 MG capsule Commonly known as:  CYMBALTA Take 30 mg by mouth daily.   EPINEPHrine 0.3 mg/0.3 mL Soaj injection Commonly known as:  EPI-PEN USE AS DIRECTED FOR LIFE THREATENING ALLERGIC REACTIONS   fluticasone-salmeterol 230-21 MCG/ACT inhaler Commonly known as:  ADVAIR HFA Inhale 2 puffs into the lungs 2 (two) times daily.   furosemide 40 MG tablet Commonly known as:  LASIX Take 1 tablet (40 mg total) by mouth daily.   gabapentin 300 MG capsule Commonly known as:  NEURONTIN Take 600 mg by mouth 3 (three) times daily.   GLYXAMBI 10-5 MG Tabs Generic drug:  Empagliflozin-Linagliptin Take 1 tablet by mouth daily.   hydrOXYzine 25 MG capsule Commonly known  as:  VISTARIL TAKE ONE CAPSULE BY MOUTH EVERY 8 HOURS AS NEEDED   Magnesium 250 MG Tabs Take 1 tablet by mouth daily.   meloxicam 15 MG tablet Commonly known as:  MOBIC Take 15 mg by mouth daily.   montelukast 10 MG tablet Commonly known as:  SINGULAIR Take 1 tablet (10 mg total) by mouth daily.   Olopatadine HCl 0.2 % Soln INSTILL 1 DROP INTO BOTH EYES ONCE DAILY FOR ITCHY EYES AS NEEDED   OMEGA 3-6-9 COMPLEX PO Take 2 capsules by mouth daily. Reported on 08/02/2015     ondansetron 4 MG disintegrating tablet Commonly known as:  ZOFRAN ODT Take 1 tablet (4 mg total) by mouth every 8 (eight) hours as needed for nausea or vomiting.   ONETOUCH DELICA LANCETS 99991111 Misc as needed (blood check).   ONETOUCH VERIO test strip Generic drug:  glucose blood 1 each by Other route as needed (blood monitoring).   pantoprazole 40 MG tablet Commonly known as:  PROTONIX TAKE 1 TABLET BY MOUTH EVERY DAY   Potassium Chloride CR 8 MEQ Cpcr capsule CR Commonly known as:  MICRO-K Take 1 capsule (8 mEq total) by mouth daily.   ranitidine 300 MG tablet Commonly known as:  ZANTAC TAKE 1 TABLET BY MOUTH EVERY NIGHT AT BEDTIME   rivaroxaban 20 MG Tabs tablet Commonly known as:  XARELTO Take 1 tablet (20 mg total) by mouth daily with supper.   sodium chloride 0.65 % Soln nasal spray Commonly known as:  OCEAN Place 2 sprays into both nostrils daily as needed for congestion (congestion).   SPIRIVA RESPIMAT 1.25 MCG/ACT Aers Generic drug:  Tiotropium Bromide Monohydrate INHALE 2 PUFFS BY MOUTH EVERY DAY   SUMAtriptan 100 MG tablet Commonly known as:  IMITREX Take 100 mg x 1 may, repeat in 2 hour for migraine.   valsartan 160 MG tablet Commonly known as:  DIOVAN TAKE 1 TABLET BY MOUTH EVERY DAY       Past Medical History:  Diagnosis Date  . Allergic rhinitis   . Allergy   . Arthritis   . Asthma   . Chronic headache   . Diabetes mellitus   . DVT (deep venous thrombosis) ([place])   . Dyspnea   . Heart murmur   . Hypertension   . Pulmonary embolism ([place])   . Pulmonary hypertension ([name])   . Seizures ([place])     Past Surgical History:  Procedure Laterality Date  . ABDOMINAL HYSTERECTOMY     partial, has ovaries  . CARDIAC CATHETERIZATION  12/28/2010   Mod. pulmonary hypertension, normal coronary arteries  . DOPPLER ECHOCARDIOGRAPHY  10/08/2011   EF=>55%,mild asymmetric LVH, mod. TR, mod. PH, mild to mod LA dilatation  . KNEE ARTHROSCOPY Left   . KNEE  SURGERY    . Nuclear Stress Test  05/20/2006   No ischemia  . PARTIAL HYSTERECTOMY    . PLANTAR FASCIA SURGERY    . TONSILLECTOMY      Allergies  Allergen Reactions  . Lyrica [Pregabalin] Other (See Comments)    Blurred vision.  Marland Kitchen Penicillins     Has patient had a PCN reaction causing immediate rash, facial/tongue/throat swelling, SOB or lightheadedness with hypotension patient had a PCN reaction causing severe rash involving mucus membranes or skin necrosis: [id number] Has patient had a PCN reaction that required hospitalization/No Has patient had a PCN reaction occurring within the last 10 years: No If all of the above answers are "NO", then may proceed with Cephalosporin use.     Marland Kitchen  Phenytoin Sodium Extended     Swelling   . Vicodin [Hydrocodone-Acetaminophen] Nausea And Vomiting    Review of systems negative except as noted in HPI / PMHx or noted below:  Review of Systems  Constitutional: Negative.   HENT: Negative.   Eyes: Negative.   Respiratory: Negative.   Cardiovascular: Negative.   Gastrointestinal: Negative.   Genitourinary: Negative.   Musculoskeletal: Negative.   Skin: Negative.   Neurological: Negative.   Endo/Heme/Allergies: Negative.   Psychiatric/Behavioral: Negative.      Objective:   Vitals:   02/28/16 1101  BP: 114/68  Pulse: 80  Resp: 18          Physical Exam  Constitutional: She is well-developed, well-nourished, and in no distress.  HENT:  Head: Normocephalic.  Right Ear: Tympanic membrane, external ear and ear canal normal.  Left Ear: Tympanic membrane, external ear and ear canal normal.  Nose: Nose normal. No mucosal edema or rhinorrhea.  Mouth/Throat: Uvula is midline, oropharynx is clear and moist and mucous membranes are normal. No oropharyngeal exudate.  Eyes: Conjunctivae are normal.  Neck: Trachea normal. No tracheal tenderness present. No tracheal deviation present. No thyromegaly present.  Cardiovascular: Normal rate,  regular rhythm, S1 normal, S2 normal and normal heart sounds.   No murmur heard. Pulmonary/Chest: Breath sounds normal. No stridor. No respiratory distress. She has no wheezes. She has no rales.  Musculoskeletal: She exhibits no edema.  Lymphadenopathy:       Head (right side): No tonsillar adenopathy present.       Head (left side): No tonsillar adenopathy present.    She has no cervical adenopathy.  Neurological: She is alert. Gait normal.  Skin: No rash noted. She is not diaphoretic. No erythema. Nails show no clubbing.  Psychiatric: Mood and affect normal.    Diagnostics:    Spirometry was performed and demonstrated an FEV1 of 1.66 at 81 % of predicted.  The patient had an Asthma Control Test with the following results: ACT Total Score: 18.    Assessment and Plan:   1. Asthma, moderate persistent, well-controlled   2. Pulmonary hypertension ([place])   3. Diastolic dysfunction   4. Other allergic rhinitis   5. LPRD (laryngopharyngeal reflux disease)     1. Continue Advair 230 - 2 inhalations twice a day  2. Continue Spiriva 2.5 respimat two inhalations one time per day.  4. Continue Nasal azelastine two sprays each nostril 1-2 times per day if needed.  5. Continue pantoprazole 40mg  in the AM and Ranitidine 300mg  in the PM  6. Continue montelukast 10 mg one tablet one time per day  7. Restart Xolair and Epi-Pen  8. Use Provental HFA or albuterol nebulization and antihistamine if needed.  9. Continue anticoagulation and therapy for hypertension.  10. Return to clinic in 6 months or earlier if there is a problem.  11. Obtain fall flu vaccine  Kahmiya appears to be doing relatively well and I'm going to keep her on a large collection of medical therapy as noted above which includes anti-inflammatory agents for her respiratory tract and therapy directed against reflux. If for some reason she has difficulty while utilizing this plan and she will contact me for further  evaluation and treatment but otherwise I will see her back in this clinic in 6 months or earlier if there is a problem.  Allena Katz, MD [place]

## 2016-02-29 DIAGNOSIS — J4540 Moderate persistent asthma, uncomplicated: Secondary | ICD-10-CM | POA: Diagnosis not present

## 2016-03-01 ENCOUNTER — Ambulatory Visit (INDEPENDENT_AMBULATORY_CARE_PROVIDER_SITE_OTHER): Payer: Medicare Other

## 2016-03-01 DIAGNOSIS — J4540 Moderate persistent asthma, uncomplicated: Secondary | ICD-10-CM | POA: Diagnosis not present

## 2016-03-04 ENCOUNTER — Other Ambulatory Visit: Payer: Self-pay | Admitting: Cardiovascular Disease

## 2016-03-05 ENCOUNTER — Other Ambulatory Visit: Payer: Self-pay | Admitting: Cardiovascular Disease

## 2016-03-05 ENCOUNTER — Other Ambulatory Visit: Payer: Self-pay | Admitting: *Deleted

## 2016-03-05 DIAGNOSIS — I10 Essential (primary) hypertension: Secondary | ICD-10-CM

## 2016-03-05 DIAGNOSIS — L309 Dermatitis, unspecified: Secondary | ICD-10-CM | POA: Diagnosis not present

## 2016-03-05 NOTE — Telephone Encounter (Signed)
Rx request sent to pharmacy.  

## 2016-03-12 ENCOUNTER — Other Ambulatory Visit: Payer: Self-pay | Admitting: *Deleted

## 2016-03-12 ENCOUNTER — Ambulatory Visit: Payer: Medicare Other

## 2016-03-12 DIAGNOSIS — I10 Essential (primary) hypertension: Secondary | ICD-10-CM

## 2016-03-12 MED ORDER — FUROSEMIDE 40 MG PO TABS
40.0000 mg | ORAL_TABLET | Freq: Every day | ORAL | 2 refills | Status: DC
Start: 2016-03-12 — End: 2017-03-24

## 2016-03-13 ENCOUNTER — Telehealth: Payer: Self-pay | Admitting: Family Medicine

## 2016-03-13 DIAGNOSIS — E0840 Diabetes mellitus due to underlying condition with diabetic neuropathy, unspecified: Secondary | ICD-10-CM

## 2016-03-13 NOTE — Telephone Encounter (Signed)
-----   Message from Eustace Pen, LPN sent at [id number]  4:45 PM EDT ----- Regarding: Lab orders Dr. Diona Browner,  Do you wish for pt to have any labs prior to CPE on 03/22/16?

## 2016-03-21 ENCOUNTER — Ambulatory Visit (INDEPENDENT_AMBULATORY_CARE_PROVIDER_SITE_OTHER): Payer: Medicare Other

## 2016-03-21 VITALS — BP 118/72 | HR 77 | Temp 98.5°F | Ht 64.5 in | Wt 232.5 lb

## 2016-03-21 DIAGNOSIS — Z0000 Encounter for general adult medical examination without abnormal findings: Secondary | ICD-10-CM

## 2016-03-21 NOTE — Progress Notes (Signed)
Pre visit review using our clinic review tool, if applicable. No additional management support is needed unless otherwise documented below in the visit note. 

## 2016-03-21 NOTE — Patient Instructions (Signed)
[name] , Thank you for taking time to come for your Medicare Wellness Visit. I appreciate your ongoing commitment to your health goals. Please review the following plan we discussed and let me know if I can assist you in the future.   These are the goals we discussed: Goals    . Increase water intake          Starting 03/21/2016, I will continue to drink at least 6-8 glasses of water daily.        This is a list of the screening recommended for you and due dates:  Health Maintenance  Topic Date Due  . Flu Shot  07/15/2016*  . Complete foot exam   05/04/2016  . Hemoglobin A1C  08/30/2016  . Eye exam for diabetics  12/07/2016  . Mammogram  09/21/2017  . Tetanus Vaccine  10/20/2023  . Colon Cancer Screening  12/15/2023  . DEXA scan (bone density measurement)  Completed  . Shingles Vaccine  Completed  .  Hepatitis C: One time screening is recommended by Center for Disease Control  (CDC) for  adults born from [birth year] through 1964.   Completed  . Pneumonia vaccines  Completed  *Topic was postponed. The date shown is not the original due date.   Preventive Care for Adults  A healthy lifestyle and preventive care can promote health and wellness. Preventive health guidelines for adults include the following key practices.  . A routine yearly physical is a good way to check with your health care provider about your health and preventive screening. It is a chance to share any concerns and updates on your health and to receive a thorough exam.  . Visit your dentist for a routine exam and preventive care every 6 months. Brush your teeth twice a day and floss once a day. Good oral hygiene prevents tooth decay and gum disease.  . The frequency of eye exams is based on your age, health, family medical history, use  of contact lenses, and other factors. Follow your health care provider's ecommendations for frequency of eye exams.  . Eat a healthy diet. Foods like vegetables, fruits, whole  grains, low-fat dairy products, and lean protein foods contain the nutrients you need without too many calories. Decrease your intake of foods high in solid fats, added sugars, and salt. Eat the right amount of calories for you. Get information about a proper diet from your health care provider, if necessary.  . Regular physical exercise is one of the most important things you can do for your health. Most adults should get at least 150 minutes of moderate-intensity exercise (any activity that increases your heart rate and causes you to sweat) each week. In addition, most adults need muscle-strengthening exercises on 2 or more days a week.  Silver Sneakers may be a benefit available to you. To determine eligibility, you may visit the website: www.silversneakers.com or contact program at 408-174-3760 Mon-Fri between 8AM-8PM.   . Maintain a healthy weight. The body mass index (BMI) is a screening tool to identify possible weight problems. It provides an estimate of body fat based on height and weight. Your health care provider can find your BMI and can help you achieve or maintain a healthy weight.   For adults 20 years and older: ? A BMI below 18.5 is considered underweight. ? A BMI of 18.5 to 24.9 is normal. ? A BMI of 25 to 29.9 is considered overweight. ? A BMI of 30 and above is considered obese.   Marland Kitchen  Maintain normal blood lipids and cholesterol levels by exercising and minimizing your intake of saturated fat. Eat a balanced diet with plenty of fruit and vegetables. Blood tests for lipids and cholesterol should begin at age 79 and be repeated every 5 years. If your lipid or cholesterol levels are high, you are over 50, or you are at high risk for heart disease, you may need your cholesterol levels checked more frequently. Ongoing high lipid and cholesterol levels should be treated with medicines if diet and exercise are not working.  . If you smoke, find out from your health care provider how to  quit. If you do not use tobacco, please do not start.  . If you choose to drink alcohol, please do not consume more than 2 drinks per day. One drink is considered to be 12 ounces (355 mL) of beer, 5 ounces (148 mL) of wine, or 1.5 ounces (44 mL) of liquor.  . If you are 88-44 years old, ask your health care provider if you should take aspirin to prevent strokes.  . Use sunscreen. Apply sunscreen liberally and repeatedly throughout the day. You should seek shade when your shadow is shorter than you. Protect yourself by wearing long sleeves, pants, a wide-brimmed hat, and sunglasses year round, whenever you are outdoors.  . Once a month, do a whole body skin exam, using a mirror to look at the skin on your back. Tell your health care provider of new moles, moles that have irregular borders, moles that are larger than a pencil eraser, or moles that have changed in shape or color.

## 2016-03-21 NOTE — Progress Notes (Signed)
Subjective:   [name] is a 70 y.o. female who presents for Medicare Annual (Subsequent) preventive examination.  Review of Systems:  N/A Cardiac Risk Factors include: advanced age (>64men, >42 women);obesity (BMI >30kg/m2);diabetes mellitus;hypertension     Objective:     Vitals: BP 118/72 (BP Location: Left Arm, Patient Position: Sitting, Cuff Size: Normal)   Pulse 77   Temp 98.5 F (36.9 C) (Oral)   Ht 5' 4.5" (1.638 m)   Wt 232 lb 8 oz (105.5 kg)   SpO2 94%   BMI 39.29 kg/m   Body mass index is 39.29 kg/m.   Tobacco History  Smoking Status  . Never Smoker  Smokeless Tobacco  . Never Used     Counseling given: No   Past Medical History:  Diagnosis Date  . Allergic rhinitis   . Allergy   . Arthritis   . Asthma   . Chronic headache   . Diabetes mellitus   . DVT (deep venous thrombosis) ([place])   . Dyspnea   . Heart murmur   . Hypertension   . Pulmonary embolism ([place])   . Pulmonary hypertension ([place])   . Seizures ([place])    Past Surgical History:  Procedure Laterality Date  . ABDOMINAL HYSTERECTOMY     partial, has ovaries  . CARDIAC CATHETERIZATION  12/28/2010   Mod. pulmonary hypertension, normal coronary arteries  . DOPPLER ECHOCARDIOGRAPHY  10/08/2011   EF=>55%,mild asymmetric LVH, mod. TR, mod. PH, mild to mod LA dilatation  . KNEE ARTHROSCOPY Left   . KNEE SURGERY    . Nuclear Stress Test  05/20/2006   No ischemia  . PARTIAL HYSTERECTOMY    . PLANTAR FASCIA SURGERY    . TONSILLECTOMY     Family History  Problem Relation Age of Onset  . Hypertension Mother   . Clotting disorder Mother   . Breast cancer Mother   . Arthritis Mother   . Stroke Mother   . Diabetes Mother   . Cancer Brother   . Alcohol abuse Father   . Arthritis Sister   . Diabetes Sister   . Multiple sclerosis Sister   . Allergies Other     grandson   History  Sexual Activity  . Sexual activity: No    Outpatient Encounter Prescriptions as of 03/21/2016    Medication Sig  . acetaminophen-codeine (TYLENOL #3) 300-30 MG tablet TK 1 T PO TID PRN P  . albuterol (PROVENTIL HFA) 108 (90 BASE) MCG/ACT inhaler Inhale 2 puffs into the lungs every 6 (six) hours as needed for wheezing or shortness of breath.  Marland Kitchen albuterol (PROVENTIL) (2.5 MG/3ML) 0.083% nebulizer solution Take 2.5 mg by nebulization every 4 (four) hours as needed for wheezing or shortness of breath.  Marland Kitchen amLODipine (NORVASC) 5 MG tablet Take 5 mg by mouth daily.  Marland Kitchen azelastine (ASTELIN) 0.1 % nasal spray USE 1-2 SPRAYS IN EACH NOSTRIL TWICE DAILY AS NEEDED  . Biotin 1 MG CAPS Take 1 mg by mouth as needed (muscles).   . cyclobenzaprine (FLEXERIL) 10 MG tablet Take 1 tablet (10 mg total) by mouth daily as needed for muscle spasms.  Marland Kitchen diltiazem (CARTIA XT) 120 MG 24 hr capsule Take 1 capsule (120 mg total) by mouth daily. Please schedule appointment for refills.  . DiphenhydrAMINE HCl (BENADRYL PO) Take 1 tablet by mouth daily as needed (itching).   . DULoxetine (CYMBALTA) 30 MG capsule Take 30 mg by mouth daily.  . Empagliflozin-Linagliptin (GLYXAMBI) 10-5 MG TABS Take 1 tablet  by mouth daily.  Marland Kitchen EPINEPHRINE 0.3 mg/0.3 mL IJ SOAJ injection USE AS DIRECTED FOR LIFE THREATENING ALLERGIC REACTIONS  . fluticasone-salmeterol (ADVAIR HFA) 230-21 MCG/ACT inhaler Inhale 2 puffs into the lungs 2 (two) times daily.  . furosemide (LASIX) 40 MG tablet Take 1 tablet (40 mg total) by mouth daily.  Marland Kitchen gabapentin (NEURONTIN) 300 MG capsule Take 600 mg by mouth 3 (three) times daily.  . hydrOXYzine (VISTARIL) 25 MG capsule TAKE ONE CAPSULE BY MOUTH EVERY 8 HOURS AS NEEDED  . Lactase (DAIRY-RELIEF PO) Take 1 capsule by mouth daily as needed (stomach upset).   . Magnesium 250 MG TABS Take 1 tablet by mouth daily.  . meloxicam (MOBIC) 15 MG tablet Take 15 mg by mouth daily.  . montelukast (SINGULAIR) 10 MG tablet Take 1 tablet (10 mg total) by mouth daily.  . Olopatadine HCl 0.2 % SOLN INSTILL 1 DROP INTO BOTH EYES  ONCE DAILY FOR ITCHY EYES AS NEEDED  . Omega 3-6-9 Fatty Acids (OMEGA 3-6-9 COMPLEX PO) Take 2 capsules by mouth daily. Reported on 08/02/2015  . ondansetron (ZOFRAN ODT) 4 MG disintegrating tablet Take 1 tablet (4 mg total) by mouth every 8 (eight) hours as needed for nausea or vomiting.  [name] DELICA LANCETS 99991111 MISC as needed (blood check).   [name] VERIO test strip 1 each by Other route as needed (blood monitoring).   . pantoprazole (PROTONIX) 40 MG tablet TAKE 1 TABLET BY MOUTH EVERY DAY  . Potassium Chloride CR (MICRO-K) 8 MEQ CPCR capsule CR Take 1 capsule (8 mEq total) by mouth daily.  . ranitidine (ZANTAC) 300 MG tablet TAKE 1 TABLET BY MOUTH EVERY NIGHT AT BEDTIME  . rivaroxaban (XARELTO) 20 MG TABS tablet Take 1 tablet (20 mg total) by mouth daily with supper.  . sodium chloride (OCEAN) 0.65 % SOLN nasal spray Place 2 sprays into both nostrils daily as needed for congestion (congestion).   . SPIRIVA RESPIMAT 1.25 MCG/ACT AERS INHALE 2 PUFFS BY MOUTH EVERY DAY  . SUMAtriptan (IMITREX) 100 MG tablet Take 100 mg x 1 may, repeat in 2 hour for migraine.  . valsartan (DIOVAN) 160 MG tablet TAKE 1 TABLET BY MOUTH EVERY DAY   Facility-Administered Encounter Medications as of 03/21/2016  Medication  . omalizumab Arvid Right) injection 300 mg    Activities of Daily Living In your present state of health, do you have any difficulty performing the following activities: 03/21/2016  Hearing? N  Vision? N  Difficulty concentrating or making decisions? N  Walking or climbing stairs? N  Dressing or bathing? N  Doing errands, shopping? N  Preparing Food and eating ? N  Using the Toilet? N  In the past six months, have you accidently leaked urine? N  Do you have problems with loss of bowel control? N  Managing your Medications? N  Managing your Finances? N  Housekeeping or managing your Housekeeping? N  Some recent data might be hidden    Patient Care Team: [name], MD as PCP -  General (Family Medicine) Hortencia Pilar, MD as Consulting Physician (Ophthalmology) [name], MD as Consulting Physician (Allergy and Immunology) Anda Kraft, MD as Consulting Physician (Endocrinology) Inocencio Homes, DPM as Consulting Physician (Podiatry) Sanda Klein, MD as Consulting Physician (Cardiology) Druscilla Brownie, MD as Consulting Physician (Dermatology) Latanya Maudlin, MD as Consulting Physician (Orthopedic Surgery) Boyd Kerbs, MD as Referring Physician (Specialist) Dionisio David, DC as Referring Physician (Chiropractic Medicine)    Assessment:  Hearing Screening   125Hz  250Hz  500Hz  1000Hz  2000Hz  3000Hz  4000Hz  6000Hz  8000Hz   Right ear:   40 40 40  0    Left ear:   40 40 40  40    Vision Screening Comments: Last eye exam with Dr. Kathlen Mody on Dec 08, 2015  Exercise Activities and Dietary recommendations Current Exercise Habits: Home exercise routine, Type of exercise: strength training/weights, Time (Minutes): 30, Frequency (Times/Week): 5, Weekly Exercise (Minutes/Week): 150, Intensity: Moderate, Exercise limited by: None identified  Goals    . Increase water intake          Starting 03/21/2016, I will continue to drink at least 6-8 glasses of water daily.       Fall Risk Fall Risk  03/21/2016 06/08/2015 05/05/2015 04/23/2014  Falls in the past year? No Yes Yes Yes  Number falls in past yr: - 1 1 1   Injury with Fall? - Yes No No  Risk Factor Category  - (No Data) - -   Depression Screen PHQ 2/9 Scores 03/21/2016 06/08/2015 05/05/2015 11/09/2014  PHQ - 2 Score 1 0 0 1     Cognitive Testing MMSE - Mini Mental State Exam 03/21/2016  Orientation to time 5  Orientation to Place 5  Registration 3  Attention/ Calculation 0  Recall 3  Language- name 2 objects 0  Language- repeat 1  Language- follow 3 step command 3  Language- read & follow direction 0  Write a sentence 0  Copy design 0  Total score 20   PLEASE NOTE: A Mini-Cog screen was completed.  Maximum score is 20. A value of 0 denotes this part of [place] MMSE was not completed or the patient failed this part of the Mini-Cog screening.   Mini-Cog Screening Orientation to Time - Max 5 pts Orientation to Place - Max 5 pts Registration - Max 3 pts Recall - Max 3 pts Language Repeat - Max 1 pts Language Follow 3 Step Command - Max 3 pts   Immunization History  Administered Date(s) Administered  . Influenza,inj,Quad PF,36+ Mos 03/25/2014  . Influenza-Unspecified 04/13/2015  . Pneumococcal Conjugate-13 05/05/2015  . Pneumococcal Polysaccharide-23 04/03/2012  . Tdap 10/19/2013  . Zoster 05/11/2013   Screening Tests Health Maintenance  Topic Date Due  . INFLUENZA VACCINE  07/15/2016 (Originally 02/14/2016)  . FOOT EXAM  05/04/2016  . HEMOGLOBIN A1C  08/30/2016  . OPHTHALMOLOGY EXAM  12/07/2016  . MAMMOGRAM  09/21/2017  . TETANUS/TDAP  10/20/2023  . COLONOSCOPY  12/15/2023  . DEXA SCAN  Completed  . ZOSTAVAX  Completed  . Hepatitis C Screening  Completed  . PNA vac Low Risk Adult  Completed      Plan:     I have personally reviewed and addressed the Medicare Annual Wellness questionnaire and have noted the following in the patient's chart:  A. Medical and social history B. Use of alcohol, tobacco or illicit drugs  C. Current medications and supplements D. Functional ability and status E.  Nutritional status F.  Physical activity G. Advance directives H. List of other physicians I.  Hospitalizations, surgeries, and ER visits in previous 12 months J.  [place] to include hearing, vision, cognitive, depression L. Referrals and appointments - none  In addition, I have reviewed and discussed with patient certain preventive protocols, quality metrics, and best practice recommendations. A written personalized care plan for preventive services as well as general preventive health recommendations were provided to patient.  See attached scanned questionnaire  for additional information.  Signed,   Lindell Noe, MHA, BS, LPN Health Advisor

## 2016-03-21 NOTE — Progress Notes (Signed)
PCP notes:   Health maintenance:  Flu vaccine - addressed A1C - pt reports lab completed by endocrinologist Colon cancer screening - pt reports completed in 2015  Abnormal screenings:   Hearing - failed Depression score: 1  Patient concerns:   None  Nurse concerns:  Pt did not complete labs ordered because pt states multiple labs were recently completed by dermatologist and endocrinologist. Pt will attempt to obtain results prior to next appt with PCP.   Next PCP appt:   03/22/16/@ 1115

## 2016-03-22 ENCOUNTER — Telehealth: Payer: Self-pay | Admitting: *Deleted

## 2016-03-22 ENCOUNTER — Encounter: Payer: Self-pay | Admitting: *Deleted

## 2016-03-22 ENCOUNTER — Ambulatory Visit (INDEPENDENT_AMBULATORY_CARE_PROVIDER_SITE_OTHER): Payer: Medicare Other | Admitting: Family Medicine

## 2016-03-22 ENCOUNTER — Encounter: Payer: Self-pay | Admitting: Family Medicine

## 2016-03-22 VITALS — BP 122/78 | HR 90 | Temp 97.6°F | Ht 64.5 in | Wt 237.5 lb

## 2016-03-22 DIAGNOSIS — Z23 Encounter for immunization: Secondary | ICD-10-CM | POA: Diagnosis not present

## 2016-03-22 DIAGNOSIS — E0840 Diabetes mellitus due to underlying condition with diabetic neuropathy, unspecified: Secondary | ICD-10-CM

## 2016-03-22 DIAGNOSIS — F325 Major depressive disorder, single episode, in full remission: Secondary | ICD-10-CM | POA: Diagnosis not present

## 2016-03-22 DIAGNOSIS — R7989 Other specified abnormal findings of blood chemistry: Secondary | ICD-10-CM | POA: Diagnosis not present

## 2016-03-22 DIAGNOSIS — J4540 Moderate persistent asthma, uncomplicated: Secondary | ICD-10-CM

## 2016-03-22 LAB — T4, FREE: Free T4: 1.91 ng/dL — ABNORMAL HIGH (ref 0.60–1.60)

## 2016-03-22 LAB — TSH: TSH: 0.07 u[IU]/mL — ABNORMAL LOW (ref 0.35–4.50)

## 2016-03-22 LAB — T3, FREE: T3, Free: 3.8 pg/mL (ref 2.3–4.2)

## 2016-03-22 NOTE — Patient Instructions (Addendum)
Stop at lab on way out for thyroid tests.  [place] for fasting labs  Keep working on healthy eating and regular exercise.

## 2016-03-22 NOTE — Assessment & Plan Note (Signed)
Stable control. 

## 2016-03-22 NOTE — Assessment & Plan Note (Signed)
Labs from derm abnormal. Re-eval and check free T3 and free T4.

## 2016-03-22 NOTE — Progress Notes (Signed)
Pre visit review using our clinic review tool, if applicable. No additional management support is needed unless otherwise documented below in the visit note. 

## 2016-03-22 NOTE — Assessment & Plan Note (Signed)
Followed by endo. Last A1c at goal.

## 2016-03-22 NOTE — Progress Notes (Signed)
I reviewed health advisor's note, was available for consultation, and agree with documentation and plan.   Signed,  Marvens Hollars T. Leighana Neyman, MD  

## 2016-03-22 NOTE — Telephone Encounter (Signed)
[name] states she forgot to mention to you that she is still having problems with urinary incontinence.  She states when we call her about her labs results, we can just let her know what your recommendations are.

## 2016-03-22 NOTE — Telephone Encounter (Signed)
Left message for [name] that she will need to schedule another office visit to discuss Urinary Incontinence per Dr. Diona Browner.

## 2016-03-22 NOTE — Telephone Encounter (Signed)
Let pt know given we have not discussed this lper my records, I would need to see her  Again to discuss in detail before recommendations can be made. Sorry.

## 2016-03-22 NOTE — Progress Notes (Signed)
Subjective:    Patient ID: [name], female    DOB: 1946-03-14, 70 y.o.   MRN: [id number]  HPI  70 year old female presents for PART 2 AMW.  Earlier on 03/21/2016 she saw Candis Musa, LPN for medicare wellness. Note  reviewed in detail when completed. No concerns.  Has noted bump in last 3 days on left vaginal area externally.   She has started going to the gym in the last week. Doing elliptical or bicycle.  DM, with neuropathy: Followed by ENDO. Dr. Wilson Singer. A1C 01/27/2016 6.1 Recent labs. Glucose 125. 03/05/2016   nml kidney function, nml LFTS, nml elevctrolytes.  TSH 0.01 .Marland Kitchen Low on recent derm labs.  Suggesting hyperthyroid. She is fatigued, no palpitations, no anxiety, no tremor.  She does head to toe itching.. Seen by Payton Mccallum.. Improved now.  Social History /Family History/Past Medical History reviewed and updated if needed.   Review of Systems  Constitutional: Negative for fatigue and fever.  HENT: Negative for ear pain.   Eyes: Negative for pain.  Respiratory: Negative for chest tightness and shortness of breath.   Cardiovascular: Negative for chest pain, palpitations and leg swelling.  Gastrointestinal: Negative for abdominal pain.  Genitourinary: Negative for dysuria.       Objective:   Physical Exam  Constitutional: Vital signs are normal. She appears well-developed and well-nourished. She is cooperative.  Non-toxic appearance. She does not appear ill. No distress.  obesity  HENT:  Head: Normocephalic.  Right Ear: Hearing, tympanic membrane, external ear and ear canal normal.  Left Ear: Hearing, tympanic membrane, external ear and ear canal normal.  Nose: Nose normal.  Eyes: Conjunctivae, EOM and lids are normal. Pupils are equal, round, and reactive to light. Lids are everted and swept, no foreign bodies found.  Neck: Trachea normal and normal range of motion. Neck supple. Carotid bruit is not present. No thyroid mass and no thyromegaly present.    Cardiovascular: Normal rate, regular rhythm, S1 normal, S2 normal, normal heart sounds and intact distal pulses.  Exam reveals no gallop.   No murmur heard. Pulmonary/Chest: Effort normal and breath sounds normal. No respiratory distress. She has no wheezes. She has no rhonchi. She has no rales.  Abdominal: Soft. Normal appearance and bowel sounds are normal. She exhibits no distension, no fluid wave, no abdominal bruit and no mass. There is no hepatosplenomegaly. There is no tenderness. There is no rebound, no guarding and no CVA tenderness. No hernia.  Genitourinary: No breast swelling, tenderness, discharge or bleeding. Pelvic exam was performed with patient supine. There is no rash, tenderness or lesion on the right labia. There is no rash, tenderness or lesion on the left labia.  Genitourinary Comments: No lesion noted, pt unable to locate area of concern  Lymphadenopathy:    She has no cervical adenopathy.    She has no axillary adenopathy.  Neurological: She is alert. She has normal strength. No cranial nerve deficit or sensory deficit.  Skin: Skin is warm, dry and intact. No rash noted.  Psychiatric: Her speech is normal and behavior is normal. Judgment normal. Her mood appears not anxious. Cognition and memory are normal. She does not exhibit a depressed mood.          Assessment & Plan:  The patient's preventative maintenance and recommended screening tests for an annual wellness exam were reviewed in full today. Brought up to date unless services declined.  Counselled on the importance of diet, exercise, and its role in overall  health and mortality. The patient's FH and SH was reviewed, including their home life, tobacco status, and drug and alcohol status.   Vaccines:  uptodate  colon:  Uptodate, copy to be scanned in computer. Mammogram: uptodate 09/2015 Hep C: neg  nonsmoker

## 2016-03-26 ENCOUNTER — Ambulatory Visit (INDEPENDENT_AMBULATORY_CARE_PROVIDER_SITE_OTHER): Payer: Medicare Other | Admitting: *Deleted

## 2016-03-26 DIAGNOSIS — J4540 Moderate persistent asthma, uncomplicated: Secondary | ICD-10-CM

## 2016-03-27 ENCOUNTER — Encounter: Payer: Self-pay | Admitting: *Deleted

## 2016-03-27 ENCOUNTER — Ambulatory Visit (INDEPENDENT_AMBULATORY_CARE_PROVIDER_SITE_OTHER): Payer: Medicare Other | Admitting: Family Medicine

## 2016-03-27 ENCOUNTER — Encounter: Payer: Self-pay | Admitting: Family Medicine

## 2016-03-27 VITALS — BP 120/66 | HR 96 | Temp 97.8°F | Ht 64.5 in | Wt 237.2 lb

## 2016-03-27 DIAGNOSIS — Z13220 Encounter for screening for lipoid disorders: Secondary | ICD-10-CM

## 2016-03-27 DIAGNOSIS — J4540 Moderate persistent asthma, uncomplicated: Secondary | ICD-10-CM | POA: Diagnosis not present

## 2016-03-27 DIAGNOSIS — N3941 Urge incontinence: Secondary | ICD-10-CM | POA: Insufficient documentation

## 2016-03-27 LAB — LIPID PANEL
Cholesterol: 111 mg/dL (ref 0–200)
HDL: 39.8 mg/dL (ref >39.00)
LDL Cholesterol: 58 mg/dL (ref 0–99)
NonHDL: 71.29
Total CHOL/HDL Ratio: 3
Triglycerides: 65 mg/dL (ref 0.0–149.0)
VLDL: 13 mg/dL (ref 0.0–40.0)

## 2016-03-27 LAB — POC URINALSYSI DIPSTICK (AUTOMATED)
Bilirubin, UA: NEGATIVE
Blood, UA: NEGATIVE
Ketones, UA: NEGATIVE
Leukocytes, UA: NEGATIVE
Nitrite, UA: NEGATIVE
Protein, UA: NEGATIVE
Spec Grav, UA: 1.02
Urobilinogen, UA: 0.2
pH, UA: 6

## 2016-03-27 MED ORDER — MIRABEGRON ER 25 MG PO TB24: 25.0000 mg | ORAL_TABLET | Freq: Every day | ORAL | 11 refills | Status: DC

## 2016-03-27 NOTE — Assessment & Plan Note (Signed)
No sign of infection.  Glucose in urine.. Pt seeing ENDO for better control.  Will start myrbetriq 25 mg daily. Follow up in 4 weeks.  Follow BP at home to make sure not elevating with this medication.

## 2016-03-27 NOTE — Progress Notes (Signed)
   Subjective:    Patient ID: [name], female    DOB: Feb 17, 1946, 70 y.o.   MRN: [id number]  HPI  70 year old female with history of  DM, HTN presents with continued  urinary incontinence.  She reports she has had this symtpoms for several month.  She feels urgency to get to restroom ( even if she is not taking her fluid pills), if she does get to bathroom in time she loses control of u rine. Wears depends. Has urinary frequency  Goes 1-2 times per hours. Flow is good, feels like she empties.   No blood in urine.  No dysuria.  No fever.   Last year she tried Detrol LA 4 mg. This medication did not work at all. She does not remember any SE.  BP Readings from Last 3 Encounters:  03/27/16 120/66  03/22/16 122/78  03/21/16 118/72      Review of Systems  Constitutional: Negative for fatigue and fever.  HENT: Negative for ear pain.   Eyes: Negative for pain.  Respiratory: Negative for chest tightness and shortness of breath.   Cardiovascular: Negative for chest pain, palpitations and leg swelling.  Gastrointestinal: Negative for abdominal pain.  Genitourinary: Negative for dysuria.       Objective:   Physical Exam  Constitutional: She is oriented to person, place, and time. Vital signs are normal. She appears well-developed and well-nourished. She is cooperative.  Non-toxic appearance. She does not appear ill. No distress.  obese  HENT:  Head: Normocephalic.  Right Ear: Hearing, tympanic membrane, external ear and ear canal normal. Tympanic membrane is not erythematous, not retracted and not bulging.  Left Ear: Hearing, tympanic membrane, external ear and ear canal normal. Tympanic membrane is not erythematous, not retracted and not bulging.  Nose: No mucosal edema or rhinorrhea. Right sinus exhibits no maxillary sinus tenderness and no frontal sinus tenderness. Left sinus exhibits no maxillary sinus tenderness and no frontal sinus tenderness.  Mouth/Throat: Uvula is  midline, oropharynx is clear and moist and mucous membranes are normal.  Eyes: Conjunctivae, EOM and lids are normal. Pupils are equal, round, and reactive to light. Lids are everted and swept, no foreign bodies found.  Neck: Trachea normal and normal range of motion. Neck supple. Carotid bruit is not present. No thyroid mass and no thyromegaly present.  Cardiovascular: Normal rate, regular rhythm, S1 normal, S2 normal, normal heart sounds, intact distal pulses and normal pulses.  Exam reveals no gallop and no friction rub.   No murmur heard. Pulmonary/Chest: Effort normal and breath sounds normal. No tachypnea. No respiratory distress. She has no decreased breath sounds. She has no wheezes. She has no rhonchi. She has no rales.  Abdominal: Soft. Normal appearance and bowel sounds are normal. There is no tenderness.  Neurological: She is alert and oriented to person, place, and time.  Skin: Skin is warm, dry and intact. No rash noted.  Psychiatric: Her speech is normal and behavior is normal. Judgment and thought content normal. Her mood appears not anxious. Cognition and memory are normal. She does not exhibit a depressed mood.          Assessment & Plan:

## 2016-03-27 NOTE — Patient Instructions (Addendum)
Cancel Thursdays lab appt. Stop at lab on way out today.  Start myrbetriq. Gets blood pressure cuff, follow BP on new meds. Goal < 140/90.

## 2016-03-27 NOTE — Progress Notes (Signed)
Pre visit review using our clinic review tool, if applicable. No additional management support is needed unless otherwise documented below in the visit note. 

## 2016-03-28 DIAGNOSIS — M1711 Unilateral primary osteoarthritis, right knee: Secondary | ICD-10-CM | POA: Diagnosis not present

## 2016-03-29 ENCOUNTER — Other Ambulatory Visit: Payer: Medicare Other

## 2016-04-04 ENCOUNTER — Encounter: Payer: Self-pay | Admitting: Family Medicine

## 2016-04-04 ENCOUNTER — Ambulatory Visit (INDEPENDENT_AMBULATORY_CARE_PROVIDER_SITE_OTHER): Payer: Medicare Other | Admitting: Family Medicine

## 2016-04-04 ENCOUNTER — Ambulatory Visit (INDEPENDENT_AMBULATORY_CARE_PROVIDER_SITE_OTHER)
Admission: RE | Admit: 2016-04-04 | Discharge: 2016-04-04 | Disposition: A | Discharge status: Home / Self Care | Payer: Medicare Other | Source: Ambulatory Visit | Attending: Family Medicine | Admitting: Family Medicine

## 2016-04-04 VITALS — BP 132/80 | HR 96 | Temp 98.4°F | Ht 64.5 in | Wt 234.5 lb

## 2016-04-04 DIAGNOSIS — M25552 Pain in left hip: Secondary | ICD-10-CM

## 2016-04-04 DIAGNOSIS — M25551 Pain in right hip: Secondary | ICD-10-CM

## 2016-04-04 MED ORDER — TIZANIDINE HCL 4 MG PO TABS: 4.0000 mg | ORAL_TABLET | Freq: Every evening | ORAL | 2 refills | Status: AC

## 2016-04-04 NOTE — Progress Notes (Signed)
Dr. Frederico Hamman T. Angad Nabers, MD, [facility] [name], [address] Phone: [phone] Fax: [phone]  04/04/2016  Patient: [name], MRN: [id number], DOB: 1945-09-15, 70 y.o.  Primary Physician:  [name], MD   Chief Complaint  Patient presents with  . Groin Pain    started this am.  Recently started on Myrbetriq  . Arm Pain    Left   Subjective:   [name] is a 70 y.o. very pleasant female patient who presents with the following:  70 year old female with a wealth of medical problems including  Possible seizure disorder, asthma,  Depression, diabetes, neuropathy, history of DVT, history of pulmonary embolism,  Migraine without aura,, arthritis, pulmonary hypertension, and paroxysmal atrial fibrillation.  For approximately 24 hours she is had bilateral groin pain  And increased pain with  Walking and standing and rising from a seated position.  She is on a well to medications including Xarelto.  She is on both Cymbalta and Neurontin.  Hurting in her groin region. One other time that she felt like this and went to Urgent Care at [facility] - had a CT scan. Did not find anything wrong at that time, and it got better with muscle relaxants alone.  She has had no trauma or injury. She is not having any significant back pain.  Hypersensitive L elbow No injury  Past Medical History, Surgical History, Social History, Family History, Problem List, Medications, and Allergies have been reviewed and updated if relevant.  Patient Active Problem List   Diagnosis Date Noted  . Urge incontinence 03/27/2016  . Abnormal TSH 03/22/2016  . Moderate persistent asthma 07/12/2015  . Paroxysmal atrial fibrillation with rapid ventricular response ([place]) 06/03/2015  . Counseling regarding end of life decision making 05/05/2015  . Neuropathy due to secondary diabetes ([place]) 01/11/2015  . Pain in Achilles tendon 01/11/2015  .  Contusion 11/09/2014  . Migraine headache without aura 08/24/2014  . Allergic rhinitis 03/25/2014  . DVT (deep venous thrombosis) hx of  03/25/2014  . Osteoarthritis of right knee 03/25/2014  .  ? of Seizure disorder 03/25/2014  . Depression, major, in remission ([place]) 03/25/2014  . Diabetes mellitus with neuropathy ([place]) 03/25/2014  . Essential hypertension, benign 03/25/2014  . GERD (gastroesophageal reflux disease) 03/25/2014  . History of pulmonary embolism 10/09/2013  . Hypercoagulable state ([place] [facility]) 01/29/2011  . Asthma, moderate persistent 01/24/2011  . Pulmonary hypertension ([place]) 01/11/2011    Past Medical History:  Diagnosis Date  . Allergic rhinitis   . Allergy   . Arthritis   . Asthma   . Chronic headache   . Diabetes mellitus   . DVT (deep venous thrombosis) ([place])   . Dyspnea   . Heart murmur   . Hypertension   . Pulmonary embolism ([place])   . Pulmonary hypertension ([place])   . Seizures ([place])     Past Surgical History:  Procedure Laterality Date  . ABDOMINAL HYSTERECTOMY     partial, has ovaries  . CARDIAC CATHETERIZATION  12/28/2010   Mod. pulmonary hypertension, normal coronary arteries  . DOPPLER ECHOCARDIOGRAPHY  10/08/2011   EF=>55%,mild asymmetric LVH, mod. TR, mod. PH, mild to mod LA dilatation  . KNEE ARTHROSCOPY Left   . KNEE SURGERY    . Nuclear Stress Test  05/20/2006   No ischemia  . PARTIAL HYSTERECTOMY    . PLANTAR FASCIA SURGERY    . TONSILLECTOMY      Social History  Social History  . Marital status: Widowed    Spouse name: N/A  . Number of children: Y  . Years of education: N/A   Occupational History  . retired Geologist, engineering.     Social History Main Topics  . Smoking status: Never Smoker  . Smokeless tobacco: Never Used  . Alcohol use 0.0 oz/week     Comment: occ glass on wine  . Drug use: No  . Sexual activity: No   Other Topics Concern  . Not on file   Social History Narrative   Widow    limited exercise.    Family  History  Problem Relation Age of Onset  . Hypertension Mother   . Clotting disorder Mother   . Breast cancer Mother   . Arthritis Mother   . Stroke Mother   . Diabetes Mother   . Cancer Brother   . Alcohol abuse Father   . Arthritis Sister   . Diabetes Sister   . Multiple sclerosis Sister   . Allergies Other     grandson    Allergies  Allergen Reactions  . Lyrica [Pregabalin] Other (See Comments)    Blurred vision.  Marland Kitchen Penicillins     Has patient had a PCN reaction causing immediate rash, facial/tongue/throat swelling, SOB or lightheadedness with hypotension patient had a PCN reaction causing severe rash involving mucus membranes or skin necrosis: [id number] Has patient had a PCN reaction that required hospitalization/No Has patient had a PCN reaction occurring within the last 10 years: No If all of the above answers are "NO", then may proceed with Cephalosporin use.     Marland Kitchen Phenytoin Sodium Extended     Swelling   . Vicodin [Hydrocodone-Acetaminophen] Nausea And Vomiting    Medication list reviewed and updated in full in [facility].   GEN: No acute illnesses, no fevers, chills. GI: No n/v/d, eating normally Pulm: No SOB Interactive and getting along well at home.  Otherwise, ROS is as per the HPI.  Objective:   BP 132/80   Pulse 96   Temp 98.4 F (36.9 C) (Oral)   Ht 5' 4.5" (1.638 m)   Wt 234 lb 8 oz (106.4 kg)   BMI 39.63 kg/m   GEN: WDWN, NAD, Non-toxic, A & O x 3 HEENT: Atraumatic, Normocephalic. Neck supple. No masses, No LAD. Ears and Nose: No external deformity. CV: RRR, No M/G/R. No JVD. No thrill. No extra heart sounds. PULM: CTA B, no wheezes, crackles, rhonchi. No retractions. No resp. distress. No accessory muscle use. EXTR: No c/c/e NEURO antalgic gait.  PSYCH: Normally interactive. Conversant. Not depressed or anxious appearing.  Calm demeanor.   HIP EXAM: SIDE: B ROM: Abduction, Flexion, Internal and External range of motion:  full Pain with terminal IROM and EROM: mild with terminal IROM only GTB: NT SLR: NEG Knees: No effusion FABER: NT REVERSE FABER: NT, neg Piriformis: NT at direct palpation Str: flexion: 4++/5 abduction: 5/5 adduction: 4++/5 Strength testing non-tender  DP and PT pulses are 2+ in both feet  Her left elbow is also hypersensitive to my touch with barely touching the fingertip with normal range of motion and no swelling or bruising.  Laboratory and Imaging Data:  Dg Pelvis 1-2 Views  Result Date: 04/04/2016 CLINICAL DATA:  Bilateral hip pain EXAM: PELVIS - 1-2 VIEW COMPARISON:  CT pelvis 05/19/2014 FINDINGS: Both hips in normal alignment. No significant degenerative change. Negative for fracture. Right pelvic calcifications unchanged from the CT and likely associated with the  right ovary, appearing chronic. IMPRESSION: No acute abnormality. Electronically Signed   By: Franchot Gallo M.D.   On: 04/04/2016 15:28     Assessment and Plan:   Bilateral hip pain - Plan: DG Pelvis 1-2 Views  Etiology unclear.  I do not think that this is orthopedic in origin.  The patient is currently anticoagulated on Xarelto.   She takes all of her medication. Palpable pulses. Str is preserved. No muscle injury.   Potentially polypharmacy with addition of Myrbetric as new med - trial of stopping this, relative rest with addition of zanaflex at night.   Hypersensitivity at L arm without injury. Unusual pattern. Patient already on Neurontin 600 mg TID as well as cymbalta. I would no nothing aside from observation at this time.  Follow-up: if not improved after weekend, rec f/u with Dr. Jacinto Reap to recheck  New Prescriptions   TIZANIDINE (ZANAFLEX) 4 MG TABLET    Take 1 tablet (4 mg total) by mouth Nightly.   Modified Medications   No medications on file   Orders Placed This Encounter  Procedures  . DG Pelvis 1-2 Views    Signed,  Frederico Hamman T. Galaxy Borden, MD   Patient's Medications  New Prescriptions    TIZANIDINE (ZANAFLEX) 4 MG TABLET    Take 1 tablet (4 mg total) by mouth Nightly.  Previous Medications   ACETAMINOPHEN-CODEINE (TYLENOL #3) 300-30 MG TABLET    TK 1 T PO TID PRN P   ALBUTEROL (PROVENTIL HFA) 108 (90 BASE) MCG/ACT INHALER    Inhale 2 puffs into the lungs every 6 (six) hours as needed for wheezing or shortness of breath.   ALBUTEROL (PROVENTIL) (2.5 MG/3ML) 0.083% NEBULIZER SOLUTION    Take 2.5 mg by nebulization every 4 (four) hours as needed for wheezing or shortness of breath.   AMLODIPINE (NORVASC) 5 MG TABLET    Take 5 mg by mouth daily.   AZELASTINE (ASTELIN) 0.1 % NASAL SPRAY    USE 1-2 SPRAYS IN EACH NOSTRIL TWICE DAILY AS NEEDED   BIOTIN 1 MG CAPS    Take 1 mg by mouth as needed (muscles).    DILTIAZEM (CARTIA XT) 120 MG 24 HR CAPSULE    Take 1 capsule (120 mg total) by mouth daily. Please schedule appointment for refills.   DIPHENHYDRAMINE HCL (BENADRYL PO)    Take 1 tablet by mouth daily as needed (itching).    DULOXETINE (CYMBALTA) 30 MG CAPSULE    Take 30 mg by mouth daily.   EMPAGLIFLOZIN-LINAGLIPTIN (GLYXAMBI) 10-5 MG TABS    Take 1 tablet by mouth daily.   EPINEPHRINE 0.3 MG/0.3 ML IJ SOAJ INJECTION    USE AS DIRECTED FOR LIFE THREATENING ALLERGIC REACTIONS   FLUTICASONE-SALMETEROL (ADVAIR HFA) 230-21 MCG/ACT INHALER    Inhale 2 puffs into the lungs 2 (two) times daily.   FUROSEMIDE (LASIX) 40 MG TABLET    Take 1 tablet (40 mg total) by mouth daily.   GABAPENTIN (NEURONTIN) 300 MG CAPSULE    Take 600 mg by mouth 3 (three) times daily.   HYDROXYZINE (VISTARIL) 25 MG CAPSULE    TAKE ONE CAPSULE BY MOUTH EVERY 8 HOURS AS NEEDED   LACTASE (DAIRY-RELIEF PO)    Take 1 capsule by mouth daily as needed (stomach upset).    MAGNESIUM 250 MG TABS    Take 1 tablet by mouth daily.   MELOXICAM (MOBIC) 15 MG TABLET    Take 15 mg by mouth daily.   MIRABEGRON ER (MYRBETRIQ) 25 MG TB24  TABLET    Take 1 tablet (25 mg total) by mouth daily.   MONTELUKAST (SINGULAIR) 10 MG TABLET     Take 1 tablet (10 mg total) by mouth daily.   OLOPATADINE HCL 0.2 % SOLN    INSTILL 1 DROP INTO BOTH EYES ONCE DAILY FOR ITCHY EYES AS NEEDED   OMEGA 3-6-9 FATTY ACIDS (OMEGA 3-6-9 COMPLEX PO)    Take 2 capsules by mouth daily. Reported on 08/02/2015   ONDANSETRON (ZOFRAN ODT) 4 MG DISINTEGRATING TABLET    Take 1 tablet (4 mg total) by mouth every 8 (eight) hours as needed for nausea or vomiting.   ONETOUCH DELICA LANCETS 99991111 MISC    as needed (blood check).    ONETOUCH VERIO TEST STRIP    1 each by Other route as needed (blood monitoring).    PANTOPRAZOLE (PROTONIX) 40 MG TABLET    TAKE 1 TABLET BY MOUTH EVERY DAY   POTASSIUM CHLORIDE CR (MICRO-K) 8 MEQ CPCR CAPSULE CR    Take 1 capsule (8 mEq total) by mouth daily.   RANITIDINE (ZANTAC) 300 MG TABLET    TAKE 1 TABLET BY MOUTH EVERY NIGHT AT BEDTIME   RIVAROXABAN (XARELTO) 20 MG TABS TABLET    Take 1 tablet (20 mg total) by mouth daily with supper.   SODIUM CHLORIDE (OCEAN) 0.65 % SOLN NASAL SPRAY    Place 2 sprays into both nostrils daily as needed for congestion (congestion).    SPIRIVA RESPIMAT 1.25 MCG/ACT AERS    INHALE 2 PUFFS BY MOUTH EVERY DAY   SUMATRIPTAN (IMITREX) 100 MG TABLET    Take 100 mg x 1 may, repeat in 2 hour for migraine.   VALSARTAN (DIOVAN) 160 MG TABLET    TAKE 1 TABLET BY MOUTH EVERY DAY  Modified Medications   No medications on file  Discontinued Medications   CYCLOBENZAPRINE (FLEXERIL) 10 MG TABLET    Take 1 tablet (10 mg total) by mouth daily as needed for muscle spasms.

## 2016-04-04 NOTE — Progress Notes (Signed)
Pre visit review using our clinic review tool, if applicable. No additional management support is needed unless otherwise documented below in the visit note. 

## 2016-04-04 NOTE — Patient Instructions (Signed)
Stop the new Myrbetriq

## 2016-04-05 ENCOUNTER — Ambulatory Visit: Payer: Medicare Other | Admitting: Family Medicine

## 2016-04-05 ENCOUNTER — Telehealth: Payer: Self-pay

## 2016-04-05 NOTE — Telephone Encounter (Signed)
Pt was seen 04/04/16 and had perineal rash but last night started with perineal burning and itching; some vaginal itching but no vaginal discharge. Pt request med for yeast infection to [facility]. Pt has not taken recent abx but pt gets yeast problems often. Pt request cb.

## 2016-04-06 MED ORDER — FLUCONAZOLE 150 MG PO TABS: 150.0000 mg | ORAL_TABLET | Freq: Once | ORAL | 0 refills | Status: AC

## 2016-04-06 NOTE — Telephone Encounter (Signed)
Spoke to pt. Sent in medication

## 2016-04-06 NOTE — Telephone Encounter (Signed)
That seems reasonable.  Diflucan 150 mg, 1 po x 1, repeat if needed in 1 week, #2, 0 ref

## 2016-04-16 ENCOUNTER — Other Ambulatory Visit: Payer: Self-pay | Admitting: Cardiovascular Disease

## 2016-04-16 NOTE — Telephone Encounter (Signed)
REFILL 

## 2016-04-23 ENCOUNTER — Other Ambulatory Visit: Payer: Self-pay | Admitting: Allergy and Immunology

## 2016-04-23 ENCOUNTER — Other Ambulatory Visit: Payer: Self-pay | Admitting: Cardiovascular Disease

## 2016-04-27 ENCOUNTER — Ambulatory Visit (INDEPENDENT_AMBULATORY_CARE_PROVIDER_SITE_OTHER): Payer: Medicare Other | Admitting: Family Medicine

## 2016-04-27 ENCOUNTER — Encounter: Payer: Self-pay | Admitting: Family Medicine

## 2016-04-27 DIAGNOSIS — R1030 Lower abdominal pain, unspecified: Secondary | ICD-10-CM | POA: Diagnosis not present

## 2016-04-27 DIAGNOSIS — N3941 Urge incontinence: Secondary | ICD-10-CM | POA: Diagnosis not present

## 2016-04-27 NOTE — Assessment & Plan Note (Addendum)
No further meds at this time. Counseled on weight loss and frequent bladder emptying.

## 2016-04-27 NOTE — Assessment & Plan Note (Signed)
Resolved

## 2016-04-27 NOTE — Progress Notes (Signed)
Pre visit review using our clinic review tool, if applicable. No additional management support is needed unless otherwise documented below in the visit note. 

## 2016-04-27 NOTE — Patient Instructions (Signed)
Continue wearing Depends. Empty bladder frequently.

## 2016-04-27 NOTE — Progress Notes (Signed)
   Subjective:    Patient ID: [name], female    DOB: 1946/01/30, 70 y.o.   MRN: [id number]  HPI   70 year old female presents for follow up urinary incontinence. Started on Myrbetriq 25 mg  4 weeks ago.  Today she report that after  1 week she got a yeast infeciton.. So stopped this med. Improved lower abd p/groin pain after yeast treated. BP Readings from Last 3 Encounters:  04/27/16 104/68  04/04/16 132/80  03/27/16 120/66   She continues to have urinary urgency and and incontinence. She would like to hold off on any trial of medication at this time.     Review of Systems  Constitutional: Negative for fatigue and fever.  HENT: Negative for ear pain.   Eyes: Negative for pain.  Respiratory: Negative for chest tightness and shortness of breath.   Cardiovascular: Negative for chest pain, palpitations and leg swelling.  Gastrointestinal: Negative for abdominal pain.  Genitourinary: Negative for dysuria.       Objective:   Physical Exam  Constitutional: She is oriented to person, place, and time. Vital signs are normal. She appears well-developed and well-nourished. She is cooperative.  Non-toxic appearance. She does not appear ill. No distress.  obese  HENT:  Head: Normocephalic.  Right Ear: Hearing, tympanic membrane, external ear and ear canal normal. Tympanic membrane is not erythematous, not retracted and not bulging.  Left Ear: Hearing, tympanic membrane, external ear and ear canal normal. Tympanic membrane is not erythematous, not retracted and not bulging.  Nose: No mucosal edema or rhinorrhea. Right sinus exhibits no maxillary sinus tenderness and no frontal sinus tenderness. Left sinus exhibits no maxillary sinus tenderness and no frontal sinus tenderness.  Mouth/Throat: Uvula is midline, oropharynx is clear and moist and mucous membranes are normal.  Eyes: Conjunctivae, EOM and lids are normal. Pupils are equal, round, and reactive to light. Lids are everted  and swept, no foreign bodies found.  Neck: Trachea normal and normal range of motion. Neck supple. Carotid bruit is not present. No thyroid mass and no thyromegaly present.  Cardiovascular: Normal rate, regular rhythm, S1 normal, S2 normal, normal heart sounds, intact distal pulses and normal pulses.  Exam reveals no gallop and no friction rub.   No murmur heard. Pulmonary/Chest: Effort normal and breath sounds normal. No tachypnea. No respiratory distress. She has no decreased breath sounds. She has no wheezes. She has no rhonchi. She has no rales.  Abdominal: Soft. Normal appearance and bowel sounds are normal. There is no tenderness.  Neurological: She is alert and oriented to person, place, and time.  Skin: Skin is warm, dry and intact. No rash noted.  Psychiatric: Her speech is normal and behavior is normal. Judgment and thought content normal. Her mood appears not anxious. Cognition and memory are normal. She does not exhibit a depressed mood.          Assessment & Plan:

## 2016-04-29 ENCOUNTER — Other Ambulatory Visit: Payer: Self-pay | Admitting: Physician Assistant

## 2016-04-30 NOTE — Telephone Encounter (Signed)
This is not Dr Irish Lack pt's.

## 2016-04-30 NOTE — Telephone Encounter (Signed)
Richardson Dopp, [place] has not seen this pt. Looks like a Dr. Sallyanne Kuster pt. I would route this to Dr. Lurline Del nurse or CMA.

## 2016-04-30 NOTE — Telephone Encounter (Signed)
Rx(s) sent to pharmacy electronically.  

## 2016-04-30 NOTE — Telephone Encounter (Signed)
Dr. Sallyanne Kuster  Patient,not a [facility] pt

## 2016-05-02 ENCOUNTER — Other Ambulatory Visit: Payer: Self-pay | Admitting: Nurse Practitioner

## 2016-05-09 DIAGNOSIS — M25572 Pain in left ankle and joints of left foot: Secondary | ICD-10-CM | POA: Diagnosis not present

## 2016-05-09 DIAGNOSIS — M71572 Other bursitis, not elsewhere classified, left ankle and foot: Secondary | ICD-10-CM | POA: Diagnosis not present

## 2016-05-09 DIAGNOSIS — M71571 Other bursitis, not elsewhere classified, right ankle and foot: Secondary | ICD-10-CM | POA: Diagnosis not present

## 2016-05-10 DIAGNOSIS — I10 Essential (primary) hypertension: Secondary | ICD-10-CM | POA: Diagnosis not present

## 2016-05-10 DIAGNOSIS — G629 Polyneuropathy, unspecified: Secondary | ICD-10-CM | POA: Diagnosis not present

## 2016-05-10 DIAGNOSIS — M1711 Unilateral primary osteoarthritis, right knee: Secondary | ICD-10-CM | POA: Diagnosis not present

## 2016-05-10 DIAGNOSIS — I482 Chronic atrial fibrillation: Secondary | ICD-10-CM | POA: Diagnosis not present

## 2016-05-10 DIAGNOSIS — E118 Type 2 diabetes mellitus with unspecified complications: Secondary | ICD-10-CM | POA: Diagnosis not present

## 2016-05-10 DIAGNOSIS — E559 Vitamin D deficiency, unspecified: Secondary | ICD-10-CM | POA: Diagnosis not present

## 2016-05-17 DIAGNOSIS — M1711 Unilateral primary osteoarthritis, right knee: Secondary | ICD-10-CM | POA: Diagnosis not present

## 2016-05-17 DIAGNOSIS — I10 Essential (primary) hypertension: Secondary | ICD-10-CM | POA: Diagnosis not present

## 2016-05-17 DIAGNOSIS — M1990 Unspecified osteoarthritis, unspecified site: Secondary | ICD-10-CM | POA: Diagnosis not present

## 2016-05-17 DIAGNOSIS — M25511 Pain in right shoulder: Secondary | ICD-10-CM | POA: Diagnosis not present

## 2016-05-17 DIAGNOSIS — E118 Type 2 diabetes mellitus with unspecified complications: Secondary | ICD-10-CM | POA: Diagnosis not present

## 2016-05-21 DIAGNOSIS — J4540 Moderate persistent asthma, uncomplicated: Secondary | ICD-10-CM | POA: Diagnosis not present

## 2016-05-22 ENCOUNTER — Ambulatory Visit (INDEPENDENT_AMBULATORY_CARE_PROVIDER_SITE_OTHER): Payer: Medicare Other

## 2016-05-22 DIAGNOSIS — J4540 Moderate persistent asthma, uncomplicated: Secondary | ICD-10-CM | POA: Diagnosis not present

## 2016-05-23 DIAGNOSIS — B079 Viral wart, unspecified: Secondary | ICD-10-CM | POA: Diagnosis not present

## 2016-05-23 DIAGNOSIS — M79672 Pain in left foot: Secondary | ICD-10-CM | POA: Diagnosis not present

## 2016-05-23 DIAGNOSIS — M722 Plantar fascial fibromatosis: Secondary | ICD-10-CM | POA: Diagnosis not present

## 2016-05-23 DIAGNOSIS — M71572 Other bursitis, not elsewhere classified, left ankle and foot: Secondary | ICD-10-CM | POA: Diagnosis not present

## 2016-05-23 DIAGNOSIS — M79671 Pain in right foot: Secondary | ICD-10-CM | POA: Diagnosis not present

## 2016-05-24 DIAGNOSIS — M79671 Pain in right foot: Secondary | ICD-10-CM | POA: Diagnosis not present

## 2016-05-24 DIAGNOSIS — R5382 Chronic fatigue, unspecified: Secondary | ICD-10-CM | POA: Diagnosis not present

## 2016-05-24 DIAGNOSIS — R252 Cramp and spasm: Secondary | ICD-10-CM | POA: Diagnosis not present

## 2016-05-24 DIAGNOSIS — G4719 Other hypersomnia: Secondary | ICD-10-CM | POA: Diagnosis not present

## 2016-05-24 DIAGNOSIS — M1711 Unilateral primary osteoarthritis, right knee: Secondary | ICD-10-CM | POA: Diagnosis not present

## 2016-05-27 ENCOUNTER — Other Ambulatory Visit: Payer: Self-pay | Admitting: Allergy and Immunology

## 2016-06-05 ENCOUNTER — Other Ambulatory Visit: Payer: Self-pay | Admitting: Nurse Practitioner

## 2016-06-12 DIAGNOSIS — H47329 Drusen of optic disc, unspecified eye: Secondary | ICD-10-CM | POA: Diagnosis not present

## 2016-06-12 DIAGNOSIS — H04123 Dry eye syndrome of bilateral lacrimal glands: Secondary | ICD-10-CM | POA: Diagnosis not present

## 2016-06-12 DIAGNOSIS — H40013 Open angle with borderline findings, low risk, bilateral: Secondary | ICD-10-CM | POA: Diagnosis not present

## 2016-06-14 ENCOUNTER — Ambulatory Visit (INDEPENDENT_AMBULATORY_CARE_PROVIDER_SITE_OTHER): Payer: Medicare Other | Admitting: Cardiovascular Disease

## 2016-06-14 ENCOUNTER — Encounter: Payer: Self-pay | Admitting: Cardiovascular Disease

## 2016-06-14 ENCOUNTER — Telehealth: Payer: Self-pay | Admitting: Cardiovascular Disease

## 2016-06-14 VITALS — BP 120/74 | HR 84 | Ht 64.5 in | Wt 230.8 lb

## 2016-06-14 DIAGNOSIS — Z01810 Encounter for preprocedural cardiovascular examination: Secondary | ICD-10-CM

## 2016-06-14 DIAGNOSIS — I480 Paroxysmal atrial fibrillation: Secondary | ICD-10-CM | POA: Diagnosis not present

## 2016-06-14 DIAGNOSIS — I10 Essential (primary) hypertension: Secondary | ICD-10-CM

## 2016-06-14 DIAGNOSIS — I272 Pulmonary hypertension, unspecified: Secondary | ICD-10-CM

## 2016-06-14 DIAGNOSIS — E0840 Diabetes mellitus due to underlying condition with diabetic neuropathy, unspecified: Secondary | ICD-10-CM

## 2016-06-14 DIAGNOSIS — Z86711 Personal history of pulmonary embolism: Secondary | ICD-10-CM | POA: Diagnosis not present

## 2016-06-14 NOTE — Telephone Encounter (Signed)
She should continue both valsartan and diltiazem. Only stopping amlodipine. I am glad she is not taking meloxicam. Thanks, MCr

## 2016-06-14 NOTE — Telephone Encounter (Signed)
Pt giving me updates - she has both diltiazem and valsartan @ home, wondering if she should stop either of these meds. Also confirms she is no longer taking meloxicam.  She voiced understanding of instructions to stop amlodipine per provider notes. Informed her I am not aware of any other instructions to change meds - Dr. Sallyanne Kuster has not finished note yet. Pt aware I am routing to provider.

## 2016-06-14 NOTE — Telephone Encounter (Signed)
New Message  Pt voiced MD-Croitoru told pt if she needed to speak with nurse or him to call back.  Pt calling wanting to speak with nurse.  Please f/u with pt

## 2016-06-14 NOTE — Progress Notes (Signed)
Cardiology Office Note    Date:  06/15/2016   ID:  [name] 10-Aug-1945, MRN [id number]  PCP:  Alicia Lofts, MD  Cardiologist:   Alicia Klein, MD   Chief complaint: Atrial fibrillation follow-up   History of Present Illness:  [name] is a 70 y.o. female with paroxysmal atrial fibrillation, remote DVT/PE, moderate pulmonary hypertension, chronic reactive airway disease and degenerative joint disease returning for follow-up.  Her major complaints are related to musculoskeletal pain, especially in her knee. She is no longer taking nonsteroidal anti-inflammatory drugs but has been prescribed Tylenol No. 3. She has not had any problems with palpitations, dizziness, syncope, chest pain or any worsening of her occasional episodes of wheezing and shortness of breath. The dyspnea is not exertional.  The last clinically evident episode of atrial fibrillation was roughly one year ago. She converted spontaneously to sinus rhythm after diltiazem was added for rate control.  She is a little confused about her medications today. Initially she told me that she is taking meloxicam daily. Her medication list also includes both amlodipine and diltiazem.  She has normal coronary arteries by previous coronary angiography and had moderate pulmonary artery hypertension by right heart catheterization (systolic 50 mmHg). She is taking some Xarelto without bleeding problems. She has had a previous equivocal workup for hypercoagulable conditions (lupus anticoagulant positive on one of 2 separate assays, protein S activity decreased with normal total protein S level).    Past Medical History:  Diagnosis Date  . Allergic rhinitis   . Allergy   . Arthritis   . Asthma   . Chronic headache   . Diabetes mellitus   . DVT (deep venous thrombosis) ([place])   . Dyspnea   . Heart murmur   . Hypertension   . Pulmonary embolism ([place])   . Pulmonary hypertension   . Seizures ([place])     Past  Surgical History:  Procedure Laterality Date  . ABDOMINAL HYSTERECTOMY     partial, has ovaries  . CARDIAC CATHETERIZATION  12/28/2010   Mod. pulmonary hypertension, normal coronary arteries  . DOPPLER ECHOCARDIOGRAPHY  10/08/2011   EF=>55%,mild asymmetric LVH, mod. TR, mod. PH, mild to mod LA dilatation  . KNEE ARTHROSCOPY Left   . KNEE SURGERY    . Nuclear Stress Test  05/20/2006   No ischemia  . PARTIAL HYSTERECTOMY    . PLANTAR FASCIA SURGERY    . TONSILLECTOMY      Current Medications: Outpatient Medications Prior to Visit  Medication Sig Dispense Refill  . acetaminophen-codeine (TYLENOL #3) 300-30 MG tablet TK 1 T PO TID PRN P  0  . albuterol (PROVENTIL HFA) 108 (90 BASE) MCG/ACT inhaler Inhale 2 puffs into the lungs every 6 (six) hours as needed for wheezing or shortness of breath. 1 Inhaler 0  . albuterol (PROVENTIL) (2.5 MG/3ML) 0.083% nebulizer solution Take 2.5 mg by nebulization every 4 (four) hours as needed for wheezing or shortness of breath.    Marland Kitchen azelastine (ASTELIN) 0.1 % nasal spray USE 1-2 SPRAYS IN EACH NOSTRIL TWICE DAILY AS NEEDED 30 mL 5  . Biotin 1 MG CAPS Take 1 mg by mouth as needed (muscles).     Marland Kitchen diltiazem (CARTIA XT) 120 MG 24 hr capsule Take 1 capsule (120 mg total) by mouth daily. KEEP OV. 30 capsule 2  . DiphenhydrAMINE HCl (BENADRYL PO) Take 1 tablet by mouth daily as needed (itching).     . DULoxetine (CYMBALTA) 30 MG capsule Take 30  mg by mouth daily.    . Empagliflozin-Linagliptin (GLYXAMBI) 10-5 MG TABS Take 1 tablet by mouth daily.    Marland Kitchen EPINEPHRINE 0.3 mg/0.3 mL IJ SOAJ injection USE AS DIRECTED FOR LIFE THREATENING ALLERGIC REACTIONS 2 Device 2  . fluticasone-salmeterol (ADVAIR HFA) 230-21 MCG/ACT inhaler Inhale 2 puffs into the lungs 2 (two) times daily. 1 Inhaler 5  . furosemide (LASIX) 40 MG tablet Take 1 tablet (40 mg total) by mouth daily. 30 tablet 2  . gabapentin (NEURONTIN) 300 MG capsule Take 600 mg by mouth 3 (three) times daily.  6  .  hydrOXYzine (VISTARIL) 25 MG capsule TAKE ONE CAPSULE BY MOUTH EVERY 8 HOURS AS NEEDED  3  . Lactase (DAIRY-RELIEF PO) Take 1 capsule by mouth daily as needed (stomach upset).     . Magnesium 250 MG TABS Take 1 tablet by mouth daily.    . meloxicam (MOBIC) 15 MG tablet Take 15 mg by mouth daily.    . montelukast (SINGULAIR) 10 MG tablet TAKE 1 TABLET(10 MG) BY MOUTH DAILY 30 tablet 3  . Olopatadine HCl 0.2 % SOLN INSTILL 1 DROP INTO BOTH EYES ONCE DAILY FOR ITCHY EYES AS NEEDED 7.5 mL 1  . Omega 3-6-9 Fatty Acids (OMEGA 3-6-9 COMPLEX PO) Take 2 capsules by mouth daily. Reported on 08/02/2015    . ondansetron (ZOFRAN ODT) 4 MG disintegrating tablet Take 1 tablet (4 mg total) by mouth every 8 (eight) hours as needed for nausea or vomiting. 10 tablet 0  . ONETOUCH DELICA LANCETS 99991111 MISC as needed (blood check).     [name] test strip 1 each by Other route as needed (blood monitoring).     . pantoprazole (PROTONIX) 40 MG tablet TAKE 1 TABLET BY MOUTH EVERY DAY 90 tablet 1  . Potassium Chloride CR (MICRO-K) 8 MEQ CPCR capsule CR Take 1 capsule (8 mEq total) by mouth daily. 60 capsule 0  . ranitidine (ZANTAC) 300 MG tablet TAKE 1 TABLET BY MOUTH EVERY NIGHT AT BEDTIME 90 tablet 0  . sodium chloride (OCEAN) 0.65 % SOLN nasal spray Place 2 sprays into both nostrils daily as needed for congestion (congestion).     . SPIRIVA RESPIMAT 1.25 MCG/ACT AERS INHALE 2 PUFFS BY MOUTH EVERY DAY 4 g 5  . SUMAtriptan (IMITREX) 100 MG tablet Take 100 mg x 1 may, repeat in 2 hour for migraine. 10 tablet 0  . valsartan (DIOVAN) 160 MG tablet TAKE 1 TABLET BY MOUTH EVERY DAY 30 tablet 1  . XARELTO 20 MG TABS tablet TAKE ONE TABLET BY MOUTH EACH EVENING WITH DINNER 30 tablet 0  . amLODipine (NORVASC) 5 MG tablet Take 1 tablet (5 mg total) by mouth daily. MUST KEEP APPOINTMENT 06/14/16 WITH DR [name] FOR FUTURE REFILLS 30 tablet 1   Facility-Administered Medications Prior to Visit  Medication Dose Route  Frequency Provider Last Rate Last Dose  . omalizumab Arvid Right) injection 300 mg  300 mg Subcutaneous Q28 days Alicia Prows, MD   300 mg at 05/22/16 1155     Allergies:   Lyrica [pregabalin]; Penicillins; Phenytoin sodium extended; and Vicodin [hydrocodone-acetaminophen]   Social History   Social History  . Marital status: Widowed    Spouse name: N/A  . Number of children: Y  . Years of education: N/A   Occupational History  . retired Geologist, engineering.     Social History Main Topics  . Smoking status: Never Smoker  . Smokeless tobacco: Never Used  . Alcohol use 0.0  oz/week     Comment: occ glass on wine  . Drug use: No  . Sexual activity: No   Other Topics Concern  . None   Social History Narrative   Widow    limited exercise.     Family History:  The patient's family history includes Alcohol abuse in her father; Allergies in her other; Arthritis in her mother and sister; Breast cancer in her mother; Cancer in her brother; Clotting disorder in her mother; Diabetes in her mother and sister; Hypertension in her mother; Multiple sclerosis in her sister; Stroke in her mother.   ROS:   Please see the history of present illness.    ROS All other systems reviewed and are negative.   PHYSICAL EXAM:   VS:  BP 120/74 (BP Location: Right Arm, Patient Position: Sitting, Cuff Size: Large)   Pulse 84   Ht 5' 4.5" (1.638 m)   Wt 230 lb 12.8 oz (104.7 kg)   SpO2 97%   BMI 39.00 kg/m    GEN: Well nourished, well developed, in no acute distress  HEENT: normal  Neck: no JVD, carotid bruits, or masses Cardiac: RRR; no murmurs, rubs, or gallops,no edema  Respiratory:  clear to auscultation bilaterally, normal work of breathing GI: soft, nontender, nondistended, + BS MS: no deformity or atrophy  Skin: warm and dry, no rash Neuro:  Alert and Oriented x 3, Strength and sensation are intact Psych: euthymic mood, full affect  Wt Readings from Last 3 Encounters:  06/14/16 230 lb 12.8 oz  (104.7 kg)  04/27/16 231 lb (104.8 kg)  04/04/16 234 lb 8 oz (106.4 kg)      Studies/Labs Reviewed:   EKG:  EKG is ordered today.  The ekg ordered today demonstrates Normal sinus rhythm, left ventricular hypertrophy by voltage only  Recent Labs: 12/09/2015: ALT 11; BUN 15; Creatinine, Ser 0.93; Hemoglobin 12.5; Platelets 322.0; Potassium 4.7; Sodium 140 03/22/2016: TSH 0.07   Lipid Panel    Component Value Date/Time   CHOL 111 03/27/2016 0951   TRIG 65.0 03/27/2016 0951   HDL 39.80 03/27/2016 0951   CHOLHDL 3 03/27/2016 0951   VLDL 13.0 03/27/2016 0951   LDLCALC 58 03/27/2016 0951     ASSESSMENT:    1. Paroxysmal atrial fibrillation with rapid ventricular response (HCC)   2. Essential hypertension, benign   3. History of pulmonary embolism   4. Pulmonary hypertension   5. Diabetes mellitus due to underlying condition with diabetic neuropathy, without long-term current use of insulin ([place])   6. Preoperative cardiovascular examination      PLAN:  In order of problems listed above:  1. AFib: Episodes appear to be very infrequent. She is on appropriate anticoagulation, also recommended for venous thromboembolic disease and moderate pulmonary hypertension. CHADSVasc 4 (age, gender, HTN, DM). 2. HTN: Excellent control. Stop the amlodipine. Continue valsartan and diltiazem only. Call us back with blood pressure readings at home in a couple of weeks to make sure we don't need to increase the dose of diltiazem. Avoid beta blockers due to reactive airway disease. 3. Hx DVT/PE conflicting results on previous hypercoagulable workup, but this is probably a moot point since she needs to remain on anticoagulation for atrial fibrillation anyway. 4. PAH: This may be related to previous thromboembolic events, possible contribution of diastolic dysfunction from hypertensive heart disease 5. DM: She reports good glycemic control 6. Preop CV exam: She is considering knee surgery with Dr.  Gladstone Lighter. I think she is at low risk  for major cardiovascular complications with such surgery, but she will require aggressive DVT prophylaxis with early reinstitution of anticoagulation.    Medication Adjustments/Labs and Tests Ordered: Current medicines are reviewed at length with the patient today.  Concerns regarding medicines are outlined above.  Medication changes, Labs and Tests ordered today are listed in the Patient Instructions below. Patient Instructions  Dr Sallyanne Kuster has recommended making the following medication changes: 1. STOP Amlodipine  Your physician has requested that you regularly monitor and record your blood pressure readings at home. Please use the same machine at the same time of day to check your readings and record them to bring to your follow-up visit. IN 2-3 WEEKS please report your blood pressure readings by sending them through mychart or by calling the office to speak with a nurse.  Dr Sallyanne Kuster recommends that you schedule a follow-up appointment in 12 months. You will receive a reminder letter in the mail two months in advance. If you don't receive a letter, please call our office to schedule the follow-up appointment.  If you need a refill on your cardiac medications before your next appointment, please call your pharmacy.    Signed, Alicia Klein, MD  06/15/2016 12:29 PM    [address], [place], [place]  [address] Phone: [phone]; Fax: [phone]

## 2016-06-14 NOTE — Patient Instructions (Signed)
Dr Sallyanne Kuster has recommended making the following medication changes: 1. STOP Amlodipine  Your physician has requested that you regularly monitor and record your blood pressure readings at home. Please use the same machine at the same time of day to check your readings and record them to bring to your follow-up visit. IN 2-3 WEEKS please report your blood pressure readings by sending them through mychart or by calling the office to speak with a nurse.  Dr Sallyanne Kuster recommends that you schedule a follow-up appointment in 12 months. You will receive a reminder letter in the mail two months in advance. If you don't receive a letter, please call our office to schedule the follow-up appointment.  If you need a refill on your cardiac medications before your next appointment, please call your pharmacy.

## 2016-06-15 NOTE — Telephone Encounter (Signed)
Pt notified of changes.

## 2016-06-18 ENCOUNTER — Other Ambulatory Visit: Payer: Self-pay | Admitting: Allergy and Immunology

## 2016-06-18 DIAGNOSIS — J4540 Moderate persistent asthma, uncomplicated: Secondary | ICD-10-CM | POA: Diagnosis not present

## 2016-06-19 ENCOUNTER — Ambulatory Visit (INDEPENDENT_AMBULATORY_CARE_PROVIDER_SITE_OTHER): Payer: Medicare Other | Admitting: *Deleted

## 2016-06-19 DIAGNOSIS — J4540 Moderate persistent asthma, uncomplicated: Secondary | ICD-10-CM

## 2016-06-21 DIAGNOSIS — M722 Plantar fascial fibromatosis: Secondary | ICD-10-CM | POA: Diagnosis not present

## 2016-06-21 DIAGNOSIS — M71571 Other bursitis, not elsewhere classified, right ankle and foot: Secondary | ICD-10-CM | POA: Diagnosis not present

## 2016-06-22 ENCOUNTER — Telehealth: Payer: Self-pay | Admitting: Cardiovascular Disease

## 2016-06-22 NOTE — Telephone Encounter (Signed)
Pt said you were disconnected.

## 2016-06-22 NOTE — Telephone Encounter (Signed)
She cannot donate blood while on Xarelto MCr

## 2016-06-22 NOTE — Telephone Encounter (Signed)
Patient would like to know if she's able to donate blood being on Xarelto. She wanted Dr. Victorino December recommendation because the person she talked to where she donates was unsure. Aware I will route for any advice and precautions - I did note that the [place] may have concerns generally regarding blood thinning medications - will review and call her back. Pt states ok to follow up on this next week.

## 2016-06-22 NOTE — Telephone Encounter (Signed)
New message      Pt have a question about her xarelto

## 2016-06-25 NOTE — Telephone Encounter (Signed)
Advice relayed to patient, who voiced understanding.

## 2016-06-25 NOTE — Telephone Encounter (Signed)
Left msg to call.

## 2016-06-26 ENCOUNTER — Ambulatory Visit (INDEPENDENT_AMBULATORY_CARE_PROVIDER_SITE_OTHER): Payer: Medicare Other | Admitting: Allergy and Immunology

## 2016-06-26 ENCOUNTER — Encounter: Payer: Self-pay | Admitting: Allergy and Immunology

## 2016-06-26 VITALS — BP 120/92

## 2016-06-26 DIAGNOSIS — J3089 Other allergic rhinitis: Secondary | ICD-10-CM

## 2016-06-26 DIAGNOSIS — K219 Gastro-esophageal reflux disease without esophagitis: Secondary | ICD-10-CM

## 2016-06-26 DIAGNOSIS — I519 Heart disease, unspecified: Secondary | ICD-10-CM | POA: Diagnosis not present

## 2016-06-26 DIAGNOSIS — J4540 Moderate persistent asthma, uncomplicated: Secondary | ICD-10-CM | POA: Diagnosis not present

## 2016-06-26 DIAGNOSIS — I272 Pulmonary hypertension, unspecified: Secondary | ICD-10-CM

## 2016-06-26 DIAGNOSIS — I5189 Other ill-defined heart diseases: Secondary | ICD-10-CM

## 2016-06-26 MED ORDER — MONTELUKAST SODIUM 10 MG PO TABS: ORAL_TABLET | ORAL | 5 refills | Status: DC

## 2016-06-26 NOTE — Patient Instructions (Addendum)
  1. Continue Advair 230 - 2 inhalations twice a day  2. Continue Flovent 220 2 inhalations twice a day added to Advair during "Flare UP"  3. Continue Spiriva 2.5 respimat two inhalations one time per day.  4. Continue nasal azelastine two sprays each nostril 1-2 times per day. May also continue nasal saline washes.  5. Continue pantoprazole 40mg  in the AM and Ranitidine 300mg  in the PM  6. Continue Xolair and Epi-Pen  7. Use Provental HFA or albuterol nebulization if needed.  8. Restart Montelukast 10mg  One tablet one time per day.  9. Prednisone 10mg  one tablet one time per day for five days only.  10. Return to clinic in 12 weeks or earlier if there is a problem.

## 2016-06-26 NOTE — Progress Notes (Signed)
Follow-up Note  Referring Provider: [name], MD Primary Provider: [name], MD Date of Office Visit: 06/26/2016  Subjective:   [name] (DOB: 1945-09-10) is a 70 y.o. female who returns to the [facility] on 06/26/2016 in re-evaluation of the following:  HPI: [name] presents to this clinic in reevaluation of her asthma and allergic rhinitis and reflux and new onset upper respiratory tract symptoms along with her history of pulmonary hypertension and diastolic dysfunction.. I last saw her in his clinic over 6 months ago.  Her asthma has been under relatively good control and it does not sound as though she has required a systemic steroid to treat an exacerbation and she rarely uses a short acting bronchodilator while continuing to use a very large collection of medical therapy including Advair and Spiriva and Xolair.   As well, her nose was doing relatively well until this Saturday. At that point in time she developed nasal congestion and runny nose and sneezing and sore throat and left ear pain along with coughing. She has not had any high fever or ugly nasal discharge.  Her reflux appears to be under relatively good control at this point in time.    Medication List      acetaminophen-codeine 300-30 MG tablet Commonly known as:  TYLENOL #3 TK 1 T PO TID PRN P   albuterol (2.5 MG/3ML) 0.083% nebulizer solution Commonly known as:  PROVENTIL Take 2.5 mg by nebulization every 4 (four) hours as needed for wheezing or shortness of breath.   albuterol 108 (90 Base) MCG/ACT inhaler Commonly known as:  PROVENTIL HFA Inhale 2 puffs into the lungs every 6 (six) hours as needed for wheezing or shortness of breath.   azelastine 0.1 % nasal spray Commonly known as:  ASTELIN USE 1-2 SPRAYS IN EACH NOSTRIL TWICE DAILY AS NEEDED   BENADRYL PO Take 1 tablet by mouth daily as needed (itching).   Biotin 1 MG Caps Take 1 mg by mouth as needed (muscles).     DAIRY-RELIEF PO Take 1 capsule by mouth daily as needed (stomach upset).   diltiazem 120 MG 24 hr capsule Commonly known as:  CARTIA XT Take 1 capsule (120 mg total) by mouth daily. KEEP OV.   DULoxetine 30 MG capsule Commonly known as:  CYMBALTA Take 30 mg by mouth daily.   EPINEPHrine 0.3 mg/0.3 mL Soaj injection Commonly known as:  EPI-PEN USE AS DIRECTED FOR LIFE THREATENING ALLERGIC REACTIONS   fluticasone-salmeterol 230-21 MCG/ACT inhaler Commonly known as:  ADVAIR HFA Inhale 2 puffs into the lungs 2 (two) times daily.   furosemide 40 MG tablet Commonly known as:  LASIX Take 1 tablet (40 mg total) by mouth daily.   gabapentin 300 MG capsule Commonly known as:  NEURONTIN Take 600 mg by mouth 3 (three) times daily.   GLYXAMBI 10-5 MG Tabs Generic drug:  Empagliflozin-Linagliptin Take 1 tablet by mouth daily.   hydrOXYzine 25 MG capsule Commonly known as:  VISTARIL TAKE ONE CAPSULE BY MOUTH EVERY 8 HOURS AS NEEDED   Magnesium 250 MG Tabs Take 1 tablet by mouth daily.   montelukast 10 MG tablet Commonly known as:  SINGULAIR TAKE 1 TABLET(10 MG) BY MOUTH DAILY   Olopatadine HCl 0.2 % Soln INSTILL 1 DROP INTO BOTH EYES ONCE DAILY FOR ITCHY EYES AS NEEDED   OMEGA 3-6-9 COMPLEX PO Take 2 capsules by mouth daily. Reported on 08/02/2015   ondansetron 4 MG disintegrating tablet Commonly known  as:  ZOFRAN ODT Take 1 tablet (4 mg total) by mouth every 8 (eight) hours as needed for nausea or vomiting.   ONETOUCH DELICA LANCETS 99991111 Misc as needed (blood check).   ONETOUCH VERIO test strip Generic drug:  glucose blood 1 each by Other route as needed (blood monitoring).   pantoprazole 40 MG tablet Commonly known as:  PROTONIX TAKE 1 TABLET BY MOUTH EVERY DAY   ranitidine 300 MG tablet Commonly known as:  ZANTAC TAKE 1 TABLET BY MOUTH EVERY NIGHT AT BEDTIME   sodium chloride 0.65 % Soln nasal spray Commonly known as:  OCEAN Place 2 sprays into both nostrils  daily as needed for congestion (congestion).   SPIRIVA RESPIMAT 1.25 MCG/ACT Aers Generic drug:  Tiotropium Bromide Monohydrate INHALE 2 PUFFS BY MOUTH EVERY DAY   SUMAtriptan 100 MG tablet Commonly known as:  IMITREX Take 100 mg x 1 may, repeat in 2 hour for migraine.   valsartan 160 MG tablet Commonly known as:  DIOVAN TAKE 1 TABLET BY MOUTH EVERY DAY   XARELTO 20 MG Tabs tablet Generic drug:  rivaroxaban TAKE ONE TABLET BY MOUTH EACH EVENING WITH DINNER       Past Medical History:  Diagnosis Date  . Allergic rhinitis   . Allergy   . Arthritis   . Asthma   . Chronic headache   . Diabetes mellitus   . DVT (deep venous thrombosis) ([place])   . Dyspnea   . Heart murmur   . Hypertension   . Pulmonary embolism ([place])   . Pulmonary hypertension   . Seizures ([place])     Past Surgical History:  Procedure Laterality Date  . ABDOMINAL HYSTERECTOMY     partial, has ovaries  . CARDIAC CATHETERIZATION  12/28/2010   Mod. pulmonary hypertension, normal coronary arteries  . DOPPLER ECHOCARDIOGRAPHY  10/08/2011   EF=>55%,mild asymmetric LVH, mod. TR, mod. PH, mild to mod LA dilatation  . KNEE ARTHROSCOPY Left   . KNEE SURGERY    . Nuclear Stress Test  05/20/2006   No ischemia  . PARTIAL HYSTERECTOMY    . PLANTAR FASCIA SURGERY    . TONSILLECTOMY      Allergies  Allergen Reactions  . Lyrica [Pregabalin] Other (See Comments)    Blurred vision.  Marland Kitchen Penicillins Swelling    Has patient had a PCN reaction causing immediate rash, facial/tongue/throat swelling, SOB or lightheadedness with hypotension patient had a PCN reaction causing severe rash involving mucus membranes or skin necrosis: [id number] Has patient had a PCN reaction that required hospitalization/No Has patient had a PCN reaction occurring within the last 10 years: No If all of the above answers are "NO", then may proceed with Cephalosporin use.     Marland Kitchen Phenytoin Sodium Extended Swelling    Swelling   . Vicodin  [Hydrocodone-Acetaminophen] Nausea And Vomiting    Review of systems negative except as noted in HPI / PMHx or noted below:  Review of Systems  Constitutional: Negative.   HENT: Negative.   Eyes: Negative.   Respiratory: Negative.   Cardiovascular: Negative.   Gastrointestinal: Negative.   Genitourinary: Negative.   Musculoskeletal: Negative.   Skin: Negative.   Neurological: Negative.   Endo/Heme/Allergies: Negative.   Psychiatric/Behavioral: Negative.      Objective:   Vitals:   06/26/16 1402  BP: (!) 120/92          Physical Exam  Constitutional: She is well-developed, well-nourished, and in no distress.  HENT:  Head: Normocephalic.  Right Ear:  Tympanic membrane, external ear and ear canal normal.  Left Ear: Tympanic membrane, external ear and ear canal normal.  Nose: Mucosal edema (erythematous) present. No rhinorrhea.  Mouth/Throat: Uvula is midline and mucous membranes are normal. Posterior oropharyngeal erythema present. No oropharyngeal exudate.  Eyes: Conjunctivae are normal.  Neck: Trachea normal. No tracheal tenderness present. No tracheal deviation present. No thyromegaly present.  Cardiovascular: Normal rate, regular rhythm, S1 normal, S2 normal and normal heart sounds.   No murmur heard. Pulmonary/Chest: Breath sounds normal. No stridor. No respiratory distress. She has no wheezes. She has no rales.  Musculoskeletal: She exhibits no edema.  Lymphadenopathy:       Head (right side): No tonsillar adenopathy present.       Head (left side): No tonsillar adenopathy present.    She has no cervical adenopathy.  Neurological: She is alert. Gait normal.  Skin: No rash noted. She is not diaphoretic. No erythema. Nails show no clubbing.  Psychiatric: Mood and affect normal.    Diagnostics:    Spirometry was performed and demonstrated an FEV1 of 1.67 at 85 % of predicted.  Assessment and Plan:   1. Moderate persistent asthma, uncomplicated   2. Pulmonary  hypertension   3. Diastolic dysfunction   4. LPRD (laryngopharyngeal reflux disease)   5. Other allergic rhinitis     1. Continue Advair 230 - 2 inhalations twice a day  2. Continue Flovent 220 2 inhalations twice a day added to Advair during "Flare UP"  3. Continue Spiriva 2.5 respimat two inhalations one time per day.  4. Continue nasal azelastine two sprays each nostril 1-2 times per day. May also continue nasal saline washes.  5. Continue pantoprazole 40mg  in the AM and Ranitidine 300mg  in the PM  6. Continue Xolair and Epi-Pen  7. Use Provental HFA or albuterol nebulization if needed.  8. Restart Montelukast 10mg  One tablet one time per day.  9. Prednisone 10mg  one tablet one time per day for five days only.  10. Return to clinic in 12 weeks or earlier if there is a problem.  I will assume that Aaliyha has a viral respiratory tract infection and treat her with the therapy mentioned above which includes upper airway hygiene maneuvers and a very low dose of systemic steroids. I will hold off on any antibiotics at this point in time unless of course she progresses regarding this issue or these symptoms never resolve over the course of the next 10-14 days. She will continue to use anti-inflammatory agents for her respiratory tract and aggressive therapy directed against reflux as noted above. She'll remain on Xolair at this point in time. I will see her back in this clinic in 12 weeks or earlier if there is a problem.  Allena Katz, MD [place]

## 2016-07-02 ENCOUNTER — Telehealth: Payer: Self-pay | Admitting: Allergy and Immunology

## 2016-07-02 MED ORDER — FLUCONAZOLE 150 MG PO TABS: 150.0000 mg | ORAL_TABLET | Freq: Every day | ORAL | 0 refills | Status: DC

## 2016-07-02 NOTE — Telephone Encounter (Signed)
Rx sent. L/M advising patient of same

## 2016-07-02 NOTE — Telephone Encounter (Signed)
Pt called and needs to have diflucan called into [facility] pharmacy. [phone].

## 2016-07-02 NOTE — Telephone Encounter (Signed)
Please inform patient that we will call out Diflucan 150 mg tablet.a

## 2016-07-02 NOTE — Telephone Encounter (Signed)
Please advise 

## 2016-07-05 DIAGNOSIS — M1711 Unilateral primary osteoarthritis, right knee: Secondary | ICD-10-CM | POA: Diagnosis not present

## 2016-07-06 ENCOUNTER — Other Ambulatory Visit: Payer: Self-pay | Admitting: Cardiovascular Disease

## 2016-07-06 ENCOUNTER — Telehealth: Payer: Self-pay | Admitting: Cardiovascular Disease

## 2016-07-06 NOTE — Telephone Encounter (Signed)
Spoke to patient, she was given fluconazole 150 mg x 1.  Was told could be a problem with her Xarelto.    Patient also takes diltiazem (p-glycoprotien inhibitor).  Advised that she should be fine with just the 1 tablet of fluconazole, but should she need more, she should contact the office.  Patient voiced understanding

## 2016-07-06 NOTE — Telephone Encounter (Signed)
Please call,pt was put on a medication for a vaginal infection and she wants to make sure it will not interfere with her blood thinner.She was instructed by her pharmacist to do this.

## 2016-07-07 ENCOUNTER — Ambulatory Visit (HOSPITAL_COMMUNITY)
Admission: EM | Admit: 2016-07-07 | Discharge: 2016-07-07 | Disposition: A | Discharge status: Home / Self Care | Payer: Medicare Other | Attending: Emergency Medicine | Admitting: Emergency Medicine

## 2016-07-07 ENCOUNTER — Encounter (HOSPITAL_COMMUNITY): Payer: Self-pay | Admitting: Emergency Medicine

## 2016-07-07 DIAGNOSIS — M7062 Trochanteric bursitis, left hip: Secondary | ICD-10-CM | POA: Diagnosis not present

## 2016-07-07 MED ORDER — METHYLPREDNISOLONE 4 MG PO TBPK: ORAL_TABLET | ORAL | 0 refills | Status: DC

## 2016-07-07 MED ORDER — CYCLOBENZAPRINE HCL 10 MG PO TABS: 10.0000 mg | ORAL_TABLET | Freq: Two times a day (BID) | ORAL | 0 refills | Status: DC | PRN

## 2016-07-07 MED ORDER — HYDROCODONE-ACETAMINOPHEN 5-325 MG PO TABS: 2.0000 | ORAL_TABLET | ORAL | 0 refills | Status: DC | PRN

## 2016-07-07 NOTE — ED Triage Notes (Signed)
Patient presents to [facility] with Sister. She states that she started having the pain, between hip and left buttocks two days ago, she states that she took Tizanidine and Tylenol 3 yesterday, she also reports she used heat and ice. Patient states nothing will relieve the pain.

## 2016-07-07 NOTE — ED Provider Notes (Signed)
CSN: [id number]     Arrival date & time 07/07/16  1750 History   First MD Initiated Contact with Patient 07/07/16 1834     Chief Complaint  Patient presents with  . Hip Pain   (Consider location/radiation/quality/duration/timing/severity/associated sxs/prior Treatment) Patient is c/o left hip pain for 3 days.   The history is provided by the patient.  Hip Pain  This is a new problem. The current episode started more than 2 days ago. The problem occurs constantly. The problem has not changed since onset.Nothing aggravates the symptoms.    Past Medical History:  Diagnosis Date  . Allergic rhinitis   . Allergy   . Arthritis   . Asthma   . Chronic headache   . Diabetes mellitus   . DVT (deep venous thrombosis) ([place])   . Dyspnea   . Heart murmur   . Hypertension   . Pulmonary embolism ([place])   . Pulmonary hypertension   . Seizures ([place])    Past Surgical History:  Procedure Laterality Date  . ABDOMINAL HYSTERECTOMY     partial, has ovaries  . CARDIAC CATHETERIZATION  12/28/2010   Mod. pulmonary hypertension, normal coronary arteries  . DOPPLER ECHOCARDIOGRAPHY  10/08/2011   EF=>55%,mild asymmetric LVH, mod. TR, mod. PH, mild to mod LA dilatation  . KNEE ARTHROSCOPY Left   . KNEE SURGERY    . Nuclear Stress Test  05/20/2006   No ischemia  . PARTIAL HYSTERECTOMY    . PLANTAR FASCIA SURGERY    . TONSILLECTOMY     Family History  Problem Relation Age of Onset  . Hypertension Mother   . Clotting disorder Mother   . Breast cancer Mother   . Arthritis Mother   . Stroke Mother   . Diabetes Mother   . Cancer Brother   . Alcohol abuse Father   . Arthritis Sister   . Diabetes Sister   . Multiple sclerosis Sister   . Allergies Other     grandson   Social History  Substance Use Topics  . Smoking status: Never Smoker  . Smokeless tobacco: Never Used  . Alcohol use 0.0 oz/week     Comment: occ glass on wine   OB History    No data available     Review of Systems   Constitutional: Negative.   HENT: Negative.   Eyes: Negative.   Respiratory: Negative.   Cardiovascular: Negative.   Gastrointestinal: Negative.   Endocrine: Negative.   Genitourinary: Negative.   Musculoskeletal: Positive for arthralgias.  Allergic/Immunologic: Negative.   Neurological: Negative.   Hematological: Negative.   Psychiatric/Behavioral: Negative.     Allergies  Lyrica [pregabalin]; Penicillins; Phenytoin sodium extended; and Vicodin [hydrocodone-acetaminophen]  Home Medications   Prior to Admission medications   Medication Sig Start Date End Date Taking? Authorizing Provider  acetaminophen-codeine (TYLENOL #3) 300-30 MG tablet TK 1 T PO TID PRN P 07/07/15  Yes Historical Provider, MD  azelastine (ASTELIN) 0.1 % nasal spray USE 1-2 SPRAYS IN EACH NOSTRIL TWICE DAILY AS NEEDED 07/12/15  Yes Adelina Mings, MD  Biotin 1 MG CAPS Take 1 mg by mouth as needed (muscles).    Yes Historical Provider, MD  diltiazem (CARTIA XT) 120 MG 24 hr capsule Take 1 capsule (120 mg total) by mouth daily. KEEP OV. 04/16/16  Yes Mihai Croitoru, MD  DULoxetine (CYMBALTA) 30 MG capsule Take 30 mg by mouth daily.   Yes Historical Provider, MD  Empagliflozin-Linagliptin (GLYXAMBI) 10-5 MG TABS Take 1 tablet by mouth daily.  Yes Historical Provider, MD  fluconazole (DIFLUCAN) 150 MG tablet Take 1 tablet (150 mg total) by mouth daily. 07/02/16  Yes [name], MD  fluticasone-salmeterol (ADVAIR HFA) 230-21 MCG/ACT inhaler Inhale 2 puffs into the lungs 2 (two) times daily. 08/02/15  Yes [name], MD  furosemide (LASIX) 40 MG tablet Take 1 tablet (40 mg total) by mouth daily. 03/12/16  Yes Mihai Croitoru, MD  gabapentin (NEURONTIN) 300 MG capsule Take 600 mg by mouth 3 (three) times daily. 04/15/15  Yes Historical Provider, MD  hydrOXYzine (VISTARIL) 25 MG capsule TAKE ONE CAPSULE BY MOUTH EVERY 8 HOURS AS NEEDED 04/14/15  Yes Historical Provider, MD  Lactase (DAIRY-RELIEF PO) Take 1  capsule by mouth daily as needed (stomach upset).    Yes Historical Provider, MD  Magnesium 250 MG TABS Take 1 tablet by mouth daily.   Yes Historical Provider, MD  montelukast (SINGULAIR) 10 MG tablet Take one tablet once a day. 06/26/16  Yes [name], MD  Olopatadine HCl 0.2 % SOLN INSTILL 1 DROP INTO BOTH EYES ONCE DAILY FOR ITCHY EYES AS NEEDED 01/11/16  Yes [name], MD  Omega 3-6-9 Fatty Acids (OMEGA 3-6-9 COMPLEX PO) Take 2 capsules by mouth daily. Reported on 08/02/2015   Yes Historical Provider, MD  ondansetron (ZOFRAN ODT) 4 MG disintegrating tablet Take 1 tablet (4 mg total) by mouth every 8 (eight) hours as needed for nausea or vomiting. 05/19/14  Yes Kaitlyn Szekalski, PA-C  ONETOUCH DELICA LANCETS [id number] MISC as needed (blood check).  11/03/14  Yes Historical Provider, MD  [name] VERIO test strip 1 each by Other route as needed (blood monitoring).  11/03/14  Yes Historical Provider, MD  pantoprazole (PROTONIX) 40 MG tablet TAKE 1 TABLET BY MOUTH EVERY DAY 05/28/16  Yes [name], MD  ranitidine (ZANTAC) 300 MG tablet TAKE 1 TABLET BY MOUTH EVERY NIGHT AT BEDTIME 06/18/16  Yes [name], MD  sodium chloride (OCEAN) 0.65 % SOLN nasal spray Place 2 sprays into both nostrils daily as needed for congestion (congestion).    Yes Historical Provider, MD  SPIRIVA RESPIMAT 1.25 MCG/ACT AERS INHALE 2 PUFFS BY MOUTH EVERY DAY 12/09/15  Yes [name], MD  SUMAtriptan (IMITREX) 100 MG tablet Take 100 mg x 1 may, repeat in 2 hour for migraine. 08/24/14  Yes Amy E Bedsole, MD  valsartan (DIOVAN) 160 MG tablet TAKE 1 TABLET BY MOUTH EVERY DAY 04/24/16  Yes Mihai Croitoru, MD  XARELTO 20 MG TABS tablet TAKE ONE TABLET BY MOUTH EACH EVENING WITH DINNER 06/06/16  Yes Mihai Croitoru, MD  albuterol (PROVENTIL HFA) 108 (90 BASE) MCG/ACT inhaler Inhale 2 puffs into the lungs every 6 (six) hours as needed for wheezing or shortness of breath. 06/23/15   [name], MD  albuterol (PROVENTIL) (2.5  MG/3ML) 0.083% nebulizer solution Take 2.5 mg by nebulization every 4 (four) hours as needed for wheezing or shortness of breath.    Historical Provider, MD  cyclobenzaprine (FLEXERIL) 10 MG tablet Take 1 tablet (10 mg total) by mouth 2 (two) times daily as needed for muscle spasms. 07/07/16   Lysbeth Penner, FNP  DiphenhydrAMINE HCl (BENADRYL PO) Take 1 tablet by mouth daily as needed (itching).     Historical Provider, MD  EPINEPHRINE 0.3 mg/0.3 mL IJ SOAJ injection USE AS DIRECTED FOR LIFE THREATENING ALLERGIC REACTIONS 01/02/16   [name], MD  methylPREDNISolone (MEDROL DOSEPAK) 4 MG TBPK tablet Take 6-5-4-3-2-1 po qd 07/07/16  Lysbeth Penner, FNP   Meds Ordered and Administered this Visit  Medications - No data to display  BP 130/70 (BP Location: Left Arm)   Pulse 83   Temp 98.8 F (37.1 C) (Oral)   Resp 16   SpO2 99%  No data found.   Physical Exam  Constitutional: She appears well-developed and well-nourished.  HENT:  Head: Normocephalic.  Eyes: Pupils are equal, round, and reactive to light.  Cardiovascular: Normal rate, regular rhythm and normal heart sounds.   Pulmonary/Chest: Effort normal and breath sounds normal.  Abdominal: Soft. Bowel sounds are normal.  Musculoskeletal: She exhibits tenderness.  TTP left greater trochanter  Nursing note and vitals reviewed.   Urgent Care Course   Clinical Course     Procedures (including critical care time)  Labs Review Labs Reviewed - No data to display  Imaging Review No results found.   Visual Acuity Review  Right Eye Distance:   Left Eye Distance:   Bilateral Distance:    Right Eye Near:   Left Eye Near:    Bilateral Near:         MDM   1. Greater trochanteric bursitis of left hip    Cyclobenzaprine 10mg  one po bid prn #20 Medrol dose pack as directed.     Lysbeth Penner, FNP 07/07/16 1859

## 2016-07-11 NOTE — Telephone Encounter (Signed)
Agreed MCr 

## 2016-07-17 ENCOUNTER — Ambulatory Visit (INDEPENDENT_AMBULATORY_CARE_PROVIDER_SITE_OTHER): Payer: Medicare Other | Admitting: *Deleted

## 2016-07-17 ENCOUNTER — Other Ambulatory Visit: Payer: Self-pay | Admitting: Cardiovascular Disease

## 2016-07-17 DIAGNOSIS — J4540 Moderate persistent asthma, uncomplicated: Secondary | ICD-10-CM

## 2016-07-18 DIAGNOSIS — J4540 Moderate persistent asthma, uncomplicated: Secondary | ICD-10-CM

## 2016-07-24 ENCOUNTER — Encounter: Payer: Self-pay | Admitting: Family Medicine

## 2016-07-24 ENCOUNTER — Ambulatory Visit (INDEPENDENT_AMBULATORY_CARE_PROVIDER_SITE_OTHER): Payer: Medicare Other | Admitting: Family Medicine

## 2016-07-24 DIAGNOSIS — N3941 Urge incontinence: Secondary | ICD-10-CM | POA: Diagnosis not present

## 2016-07-24 DIAGNOSIS — B372 Candidiasis of skin and nail: Secondary | ICD-10-CM | POA: Diagnosis not present

## 2016-07-24 MED ORDER — TOLTERODINE TARTRATE ER 4 MG PO CP24: 4.0000 mg | ORAL_CAPSULE | Freq: Every day | ORAL | 11 refills | Status: DC

## 2016-07-24 MED ORDER — NYSTATIN 100000 UNIT/GM EX CREA: 1.0000 "application " | TOPICAL_CREAM | Freq: Two times a day (BID) | CUTANEOUS | 1 refills | Status: DC

## 2016-07-24 MED ORDER — SOLIFENACIN SUCCINATE 10 MG PO TABS: 10.0000 mg | ORAL_TABLET | Freq: Every day | ORAL | 11 refills | Status: DC

## 2016-07-24 NOTE — Assessment & Plan Note (Signed)
Treat with topical nystatin. 

## 2016-07-24 NOTE — Assessment & Plan Note (Signed)
Trial of vesicare 

## 2016-07-24 NOTE — Progress Notes (Signed)
Pre visit review using our clinic review tool, if applicable. No additional management support is needed unless otherwise documented below in the visit note. 

## 2016-07-24 NOTE — Progress Notes (Signed)
   Subjective:    Patient ID: [name], female    DOB: 11/23/1945, 71 y.o.   MRN: [id number]  HPI   71 year old female pt presents for new onset rash. Similar to rash she had in past with urinary incontinence.  Soreness on inner thighs, redness. Not itchy. Treated in past for yeast infection. ( 04/27/2016)  She has loss of bladder control. Wearing pads.  No dysuria, no odor.  No fever.  Has not used any med other than Desitin on rash.  She is hesitant about retrying myrbetriq again as she feels it cause yeast infecton in past   She has had recent prednisone for back issue: in last 1week for 7 days.      Review of Systems  Constitutional: Negative for fatigue and fever.  HENT: Negative for ear pain.   Eyes: Negative for pain.  Respiratory: Negative for chest tightness and shortness of breath.   Cardiovascular: Negative for chest pain, palpitations and leg swelling.  Gastrointestinal: Negative for abdominal pain.  Genitourinary: Negative for dysuria.       Objective:   Physical Exam  Constitutional: Vital signs are normal. She appears well-developed and well-nourished. She is cooperative.  Non-toxic appearance. She does not appear ill. No distress.  overweight  HENT:  Head: Normocephalic.  Right Ear: Hearing, tympanic membrane, external ear and ear canal normal. Tympanic membrane is not erythematous, not retracted and not bulging.  Left Ear: Hearing, tympanic membrane, external ear and ear canal normal. Tympanic membrane is not erythematous, not retracted and not bulging.  Nose: No mucosal edema or rhinorrhea. Right sinus exhibits no maxillary sinus tenderness and no frontal sinus tenderness. Left sinus exhibits no maxillary sinus tenderness and no frontal sinus tenderness.  Mouth/Throat: Uvula is midline, oropharynx is clear and moist and mucous membranes are normal.  Eyes: Conjunctivae, EOM and lids are normal. Pupils are equal, round, and reactive to light. Lids are  everted and swept, no foreign bodies found.  Neck: Trachea normal and normal range of motion. Neck supple. Carotid bruit is not present. No thyroid mass and no thyromegaly present.  Cardiovascular: Normal rate, regular rhythm, S1 normal, S2 normal, normal heart sounds, intact distal pulses and normal pulses.  Exam reveals no gallop and no friction rub.   No murmur heard. Pulmonary/Chest: Effort normal and breath sounds normal. No tachypnea. No respiratory distress. She has no decreased breath sounds. She has no wheezes. She has no rhonchi. She has no rales.  Abdominal: Soft. Normal appearance and bowel sounds are normal. There is no tenderness.  Neurological: She is alert.  Skin: Skin is warm, dry and intact. No rash noted.  Erythema and satellite lesions in bilateral groin and on mons pubis   no vaginal discharge.  Psychiatric: Her speech is normal and behavior is normal. Judgment and thought content normal. Her mood appears not anxious. Cognition and memory are normal. She does not exhibit a depressed mood.          Assessment & Plan:

## 2016-07-24 NOTE — Patient Instructions (Addendum)
Start a trial of vesicare at  bedtime for urinary incontinence. Empty bladder frequently.  Wear depends as needed.  Apply topical antifungal cream twice daily , continue 48 hours after rash resolve to make sure it is gone.

## 2016-08-09 ENCOUNTER — Encounter: Payer: Self-pay | Admitting: Allergy & Immunology

## 2016-08-09 ENCOUNTER — Ambulatory Visit (INDEPENDENT_AMBULATORY_CARE_PROVIDER_SITE_OTHER): Payer: Medicare Other | Admitting: Allergy & Immunology

## 2016-08-09 VITALS — BP 150/90 | HR 113 | Temp 98.3°F | Resp 20 | Ht 64.5 in | Wt 237.6 lb

## 2016-08-09 DIAGNOSIS — J3089 Other allergic rhinitis: Secondary | ICD-10-CM

## 2016-08-09 DIAGNOSIS — J4541 Moderate persistent asthma with (acute) exacerbation: Secondary | ICD-10-CM

## 2016-08-09 DIAGNOSIS — K219 Gastro-esophageal reflux disease without esophagitis: Secondary | ICD-10-CM | POA: Diagnosis not present

## 2016-08-09 MED ORDER — METHYLPREDNISOLONE ACETATE 80 MG/ML IJ SUSP
80.0000 mg | Freq: Once | INTRAMUSCULAR | Status: AC
  Administered 2016-08-09: 80 mg via INTRAMUSCULAR

## 2016-08-09 MED ORDER — FLUTICASONE PROPIONATE HFA 220 MCG/ACT IN AERO: 2.0000 | INHALATION_SPRAY | Freq: Two times a day (BID) | RESPIRATORY_TRACT | 5 refills | Status: DC | PRN

## 2016-08-09 NOTE — Progress Notes (Signed)
FOLLOW UP  Date of Service/Encounter:  08/09/16   Assessment:   Moderate persistent asthma with acute exacerbation  LPRD (laryngopharyngeal reflux disease)  Allergic rhinitis   Asthma Reportables:  Severity: moderate persistent  Risk: high Control: not well controlled  Seasonal Influenza Vaccine: yes     Plan/Recommendations:   1. Moderate persistent asthma with acute exacerbation - You were wheezing today on exam. - You responded well to a nebulizer treatment and steroid injection. - Add Flovent 254mcg two puffs twice daily for the next 1-2 weeks. - Use the prednisone pack if the Flovent is not helping.  - Daily controller medication(s): Advair 231/21 two puffs twice daily + Spiriva Respimat two inhalations daily + Xolair monthly - Rescue medications: ProAir 4 puffs every 4-6 hours as needed or albuterol nebulizer one vial puffs every 4-6 hours as needed - Changes during respiratory infections or worsening symptoms: add Flovent 2100mcg 2 puffs twice daily for ONE TO TWO WEEKS. - Asthma control goals:  * Full participation in all desired activities (may need albuterol before activity) * Albuterol use two time or less a week on average (not counting use with activity) * Cough interfering with sleep two time or less a month * Oral steroids no more than once a year * No hospitalizations  2. LPRD (laryngopharyngeal reflux disease) - Continue with Protonix 40mg  in the morning and ranitidine 300mg  at night.  3. Other allergic rhinitis - Continue with Astelin two sprays per nostril 1-2 times daily. - Continue with nasal saline rinses. - Continue with Singulair 10mg  daily.  4. Return in about 2 months (around 10/07/2016).    Subjective:   [name] is a 71 y.o. female presenting today for follow up of  Chief Complaint  Patient presents with  . Asthma    Follow up    [name] has a history of the following: Patient Active Problem List   Diagnosis  Date Noted  . Candidal intertrigo 07/24/2016  . Groin pain 04/27/2016  . Urge incontinence 03/27/2016  . Abnormal TSH 03/22/2016  . Moderate persistent asthma 07/12/2015  . Paroxysmal atrial fibrillation with rapid ventricular response ([place]) 06/03/2015  . Counseling regarding end of life decision making 05/05/2015  . Neuropathy due to secondary diabetes ([place]) 01/11/2015  . Pain in Achilles tendon 01/11/2015  . Contusion 11/09/2014  . Migraine headache without aura 08/24/2014  . Allergic rhinitis 03/25/2014  . DVT (deep venous thrombosis) hx of  03/25/2014  . Osteoarthritis of right knee 03/25/2014  .  ? of Seizure disorder 03/25/2014  . Depression, major, in remission ([place]) 03/25/2014  . Diabetes mellitus with neuropathy ([place]) 03/25/2014  . Essential hypertension, benign 03/25/2014  . GERD (gastroesophageal reflux disease) 03/25/2014  . History of pulmonary embolism 10/09/2013  . Hypercoagulable state ([place]) 01/29/2011  . Asthma, moderate persistent 01/24/2011  . Pulmonary hypertension 01/11/2011    History obtained from: chart review and patient.  [name] was referred by [name], MD.     [name] is a 71 y.o. female presenting for a sick visit. She was last seen in December 2017 by Dr. Neldon Mc. At that time, her asthma was under good control. She was rarely using her short-acting bronchodilator and remained on her Advair, Spiriva, and Xolair. She was having increased sinus symptoms at the last visit. She was continued on Astelin nasal spray 2 sprays per nostril 1-2 times daily, nasal saline washes, and Singulair. She was given a small burst of prednisone 10 mg  daily for 5 days. She was continued on her Protonix 40 mg in the morning and Zantac 300 milligrams in the evening. Her last Xolair was January 2nd, 2018.   Since last visit, she has not done well. For the past 3-4 days, she has been wheezing. She has been using her rescue inhaler and nebulizer 3-4 times per day without  improvement. She has been afebrile and has no upper respiratory infections. She does not have the Flovent to use during respiratory flares and seems rather surprised when I mention it. Appetite has remained normal and there has been no GI symptoms. She denies reflux symptoms when she takes her PPI and H2 blocker appropriately. There have been no sick contacts and she did get her flu shot this year.   ROS also notable for xerostosis, which is baseline for her. She feels that this is secondary to her medication regimen.    Otherwise, there have been no changes to her past medical history, surgical history, family history, or social history.    Review of Systems: a 14-point review of systems is pertinent for what is mentioned in HPI.  Otherwise, all other systems were negative. Constitutional: negative other than that listed in the HPI Eyes: negative other than that listed in the HPI Ears, nose, mouth, throat, and face: negative other than that listed in the HPI Respiratory: negative other than that listed in the HPI Cardiovascular: negative other than that listed in the HPI Gastrointestinal: negative other than that listed in the HPI Genitourinary: negative other than that listed in the HPI Integument: negative other than that listed in the HPI Hematologic: negative other than that listed in the HPI Musculoskeletal: negative other than that listed in the HPI Neurological: negative other than that listed in the HPI Allergy/Immunologic: negative other than that listed in the HPI    Objective:   Blood pressure (!) 150/90, pulse (!) 113, temperature 98.3 F (36.8 C), temperature source Oral, resp. rate 20, height 5' 4.5" (1.638 m), weight 237 lb 9.6 oz (107.8 kg), SpO2 98 %. Body mass index is 40.15 kg/m.   Physical Exam:  General: Alert, interactive, in mild acute distress. Very pleasant and interactive.  Eyes: No conjunctival injection present on the right, No conjunctival injection  present on the left, PERRL bilaterally, No discharge on the right, No discharge on the left and No Horner-Trantas dots present Ears: Right TM pearly gray with normal light reflex, Left TM pearly gray with normal light reflex, Right TM intact without perforation and Left TM intact without perforation.  Nose/Throat: External nose within normal limits and septum midline, turbinates edematous and pale without discharge, post-pharynx mildly erythematous without cobblestoning in the posterior oropharynx. Tonsils 2+ without exudates Neck: Supple without thyromegaly. Lungs: Decreased breath sounds with expiratory wheezing bilaterally. Increased work of breathing. CV: Normal S1/S2, no murmurs. Capillary refill <2 seconds.  Skin: Warm and dry, without lesions or rashes. Neuro:   Grossly intact. No focal deficits appreciated. Responsive to questions.   Diagnostic studies:  Spirometry: results abnormal (FEV1: 1.52/78%, FVC: 1.70/68%, FEV1/FVC: 89%).    Spirometry consistent with possible restrictive disease. Albuterol/Atrovent nebulizer treatment given in clinic with improvement, although it did not reach ATS criteria.  Allergy Studies: None     Salvatore Marvel, MD [facility]

## 2016-08-09 NOTE — Patient Instructions (Addendum)
1. Moderate persistent asthma with acute exacerbation - You were wheezing today on exam. - You responded well to a nebulizer treatment and steroid injection. - Add Flovent 256mcg two puffs twice daily for the next 1-2 weeks. - Use the prednisone pack if the Flovent is not helping.  - Daily controller medication(s): Advair 231/21 two puffs twice daily + Spiriva Respimat two inhalations daily + Xolair monthly - Rescue medications: ProAir 4 puffs every 4-6 hours as needed or albuterol nebulizer one vial puffs every 4-6 hours as needed - Changes during respiratory infections or worsening symptoms: add Flovent 221mcg 2 puffs twice daily for ONE TO TWO WEEKS. - Asthma control goals:  * Full participation in all desired activities (may need albuterol before activity) * Albuterol use two time or less a week on average (not counting use with activity) * Cough interfering with sleep two time or less a month * Oral steroids no more than once a year * No hospitalizations  2. LPRD (laryngopharyngeal reflux disease) - Continue with Protonix 40mg  in the morning and ranitidine 300mg  at night.  3. Other allergic rhinitis - Continue with Astelin two sprays per nostril 1-2 times daily. - Continue with nasal saline rinses. - Continue with Singulair 10mg  daily.  4. Return in about 2 months (around 10/07/2016).  Please inform us of any Emergency Department visits, hospitalizations, or changes in symptoms. Call us before going to the ED for breathing or allergy symptoms since we might be able to fit you in for a sick visit. Feel free to contact us anytime with any questions, problems, or concerns.  It was a pleasure to meet you today! Best wishes in the [place] Year!   Websites that have reliable patient information: 1. American Academy of Asthma, Allergy, and Immunology: www.aaaai.org 2. Food Allergy Research and Education (FARE): foodallergy.org 3. Mothers of Asthmatics:  http://www.asthmacommunitynetwork.org 4. American College of Allergy, Asthma, and Immunology: www.acaai.org

## 2016-08-13 ENCOUNTER — Telehealth: Payer: Self-pay

## 2016-08-13 DIAGNOSIS — J4540 Moderate persistent asthma, uncomplicated: Secondary | ICD-10-CM

## 2016-08-13 NOTE — Telephone Encounter (Signed)
Pt left v/m;Vesicare is making pt to have dry mouth and constipation.  Vesicare is working and pt is using Biotin but still having dry mouth and constipation. Pt wants to know what to take for the constipation and dry mouth. [place].

## 2016-08-14 ENCOUNTER — Ambulatory Visit (INDEPENDENT_AMBULATORY_CARE_PROVIDER_SITE_OTHER): Payer: Medicare Other

## 2016-08-14 DIAGNOSIS — G629 Polyneuropathy, unspecified: Secondary | ICD-10-CM | POA: Diagnosis not present

## 2016-08-14 DIAGNOSIS — E118 Type 2 diabetes mellitus with unspecified complications: Secondary | ICD-10-CM | POA: Diagnosis not present

## 2016-08-14 DIAGNOSIS — I10 Essential (primary) hypertension: Secondary | ICD-10-CM | POA: Diagnosis not present

## 2016-08-14 DIAGNOSIS — I482 Chronic atrial fibrillation: Secondary | ICD-10-CM | POA: Diagnosis not present

## 2016-08-14 DIAGNOSIS — J4540 Moderate persistent asthma, uncomplicated: Secondary | ICD-10-CM | POA: Diagnosis not present

## 2016-08-14 LAB — HEMOGLOBIN A1C: Hemoglobin A1C: 6.2

## 2016-08-14 NOTE — Telephone Encounter (Signed)
Left message for [name] to return my call.

## 2016-08-14 NOTE — Telephone Encounter (Signed)
Ms. Harne notified as instructed by telephone.

## 2016-08-14 NOTE — Telephone Encounter (Signed)
Nonsugar lozanges and water fro dry mouth.  Miralax , water an fiber for constipation.

## 2016-08-19 ENCOUNTER — Other Ambulatory Visit: Payer: Self-pay | Admitting: Allergy and Immunology

## 2016-08-21 ENCOUNTER — Ambulatory Visit (INDEPENDENT_AMBULATORY_CARE_PROVIDER_SITE_OTHER): Payer: Medicare Other | Admitting: Family Medicine

## 2016-08-21 ENCOUNTER — Encounter: Payer: Self-pay | Admitting: Family Medicine

## 2016-08-21 VITALS — BP 108/74 | HR 105 | Temp 98.8°F | Ht 64.5 in | Wt 234.5 lb

## 2016-08-21 DIAGNOSIS — N3941 Urge incontinence: Secondary | ICD-10-CM | POA: Diagnosis not present

## 2016-08-21 DIAGNOSIS — I10 Essential (primary) hypertension: Secondary | ICD-10-CM | POA: Diagnosis not present

## 2016-08-21 DIAGNOSIS — J45909 Unspecified asthma, uncomplicated: Secondary | ICD-10-CM | POA: Diagnosis not present

## 2016-08-21 DIAGNOSIS — E118 Type 2 diabetes mellitus with unspecified complications: Secondary | ICD-10-CM | POA: Diagnosis not present

## 2016-08-21 DIAGNOSIS — E789 Disorder of lipoprotein metabolism, unspecified: Secondary | ICD-10-CM | POA: Diagnosis not present

## 2016-08-21 NOTE — Patient Instructions (Addendum)
Continue vesicare for now.  Call if any confusion or worsening dry mouth or constipation.  Get back to regular exercise.

## 2016-08-21 NOTE — Progress Notes (Signed)
Pre visit review using our clinic review tool, if applicable. No additional management support is needed unless otherwise documented below in the visit note. 

## 2016-08-21 NOTE — Progress Notes (Signed)
   Subjective:    Patient ID: [name], female    DOB: [birth year], 71 y.o.   MRN: [id number]  HPI   71 year old female presents for follow up 4 week of urinary incontinence.  At last appt 07/24/2016 we started her on Vesicare given Detrol LA ineffective and SE to Myrbetriq.  Today she reports less urgency on Vesicare.  She does have a lot of dry mouth. No confusion, mild constipation but better with Benefiber. Less leaking , able to make it to bathroom better.  Has not been waking up at night.  Has changed to decaf tea.   Yeast infection improved.    Review of Systems  Constitutional: Negative for fatigue and fever.  HENT: Negative for ear pain.   Eyes: Negative for pain.  Respiratory: Negative for chest tightness and shortness of breath.   Cardiovascular: Negative for chest pain, palpitations and leg swelling.  Gastrointestinal: Negative for abdominal pain.  Genitourinary: Negative for dysuria.       Objective:   Physical Exam  Constitutional: Vital signs are normal. She appears well-developed and well-nourished. She is cooperative.  Non-toxic appearance. She does not appear ill. No distress.  HENT:  Head: Normocephalic.  Right Ear: Hearing, tympanic membrane, external ear and ear canal normal. Tympanic membrane is not erythematous, not retracted and not bulging.  Left Ear: Hearing, tympanic membrane, external ear and ear canal normal. Tympanic membrane is not erythematous, not retracted and not bulging.  Nose: No mucosal edema or rhinorrhea. Right sinus exhibits no maxillary sinus tenderness and no frontal sinus tenderness. Left sinus exhibits no maxillary sinus tenderness and no frontal sinus tenderness.  Mouth/Throat: Uvula is midline, oropharynx is clear and moist and mucous membranes are normal.  Eyes: Conjunctivae, EOM and lids are normal. Pupils are equal, round, and reactive to light. Lids are everted and swept, no foreign bodies found.  Neck: Trachea normal and  normal range of motion. Neck supple. Carotid bruit is not present. No thyroid mass and no thyromegaly present.  Cardiovascular: Normal rate, regular rhythm, S1 normal, S2 normal, normal heart sounds, intact distal pulses and normal pulses.  Exam reveals no gallop and no friction rub.   No murmur heard. Pulmonary/Chest: Effort normal and breath sounds normal. No tachypnea. No respiratory distress. She has no decreased breath sounds. She has no wheezes. She has no rhonchi. She has no rales.  Abdominal: Soft. Normal appearance and bowel sounds are normal. There is no tenderness.  Neurological: She is alert.  Skin: Skin is warm, dry and intact. No rash noted.  Psychiatric: Her speech is normal and behavior is normal. Judgment and thought content normal. Her mood appears not anxious. Cognition and memory are normal. She does not exhibit a depressed mood.          Assessment & Plan:

## 2016-09-01 ENCOUNTER — Ambulatory Visit (INDEPENDENT_AMBULATORY_CARE_PROVIDER_SITE_OTHER): Payer: Medicare Other | Admitting: Adult Health

## 2016-09-01 VITALS — BP 134/82 | HR 77 | Temp 98.1°F | Resp 16 | Ht 64.5 in | Wt 239.0 lb

## 2016-09-01 DIAGNOSIS — R5383 Other fatigue: Secondary | ICD-10-CM | POA: Diagnosis not present

## 2016-09-01 NOTE — Progress Notes (Signed)
Pre visit review using our clinic review tool, if applicable. No additional management support is needed unless otherwise documented below in the visit note. 

## 2016-09-01 NOTE — Progress Notes (Signed)
Subjective:    Patient ID: [name], female    DOB: 05/15/1946, 71 y.o.   MRN: [id number]  HPI  71 year old female who  has a past medical history of Allergic rhinitis; Allergy; Arthritis; Asthma; Chronic headache; Diabetes mellitus; DVT (deep venous thrombosis) ([place]); Dyspnea; Heart murmur; Hypertension; Pulmonary embolism ([place]); Pulmonary hypertension; and Seizures ([place]). She presents to Saturday clinic today for fatigue over the last week.. She reports that she she was started on Vesicare around 08/21/2016 and since that time she has felt fatigued.   She denies any CP, cough, fevers, n/v/d, or feeling ill.    Review of Systems  All other systems reviewed and are negative.  See HPI   Past Medical History:  Diagnosis Date  . Allergic rhinitis   . Allergy   . Arthritis   . Asthma   . Chronic headache   . Diabetes mellitus   . DVT (deep venous thrombosis) ([place])   . Dyspnea   . Heart murmur   . Hypertension   . Pulmonary embolism ([place])   . Pulmonary hypertension   . Seizures [facility])     Social History   Social History  . Marital status: Widowed    Spouse name: N/A  . Number of children: Y  . Years of education: N/A   Occupational History  . retired Geologist, engineering.     Social History Main Topics  . Smoking status: Never Smoker  . Smokeless tobacco: Never Used  . Alcohol use 0.0 oz/week     Comment: occ glass on wine  . Drug use: No  . Sexual activity: No   Other Topics Concern  . Not on file   Social History Narrative   Widow    limited exercise.    Past Surgical History:  Procedure Laterality Date  . ABDOMINAL HYSTERECTOMY     partial, has ovaries  . CARDIAC CATHETERIZATION  12/28/2010   Mod. pulmonary hypertension, normal coronary arteries  . DOPPLER ECHOCARDIOGRAPHY  10/08/2011   EF=>55%,mild asymmetric LVH, mod. TR, mod. PH, mild to mod LA dilatation  . KNEE ARTHROSCOPY Left   . KNEE SURGERY    . Nuclear Stress Test  05/20/2006   No ischemia   . PARTIAL HYSTERECTOMY    . PLANTAR FASCIA SURGERY    . TONSILLECTOMY      Family History  Problem Relation Age of Onset  . Hypertension Mother   . Clotting disorder Mother   . Breast cancer Mother   . Arthritis Mother   . Stroke Mother   . Diabetes Mother   . Cancer Brother   . Alcohol abuse Father   . Arthritis Sister   . Diabetes Sister   . Multiple sclerosis Sister   . Allergies Other     grandson    Allergies  Allergen Reactions  . Lyrica [Pregabalin] Other (See Comments)    Blurred vision.  Marland Kitchen Penicillins Swelling    Has patient had a PCN reaction causing immediate rash, facial/tongue/throat swelling, SOB or lightheadedness with hypotension patient had a PCN reaction causing severe rash involving mucus membranes or skin necrosis: [id number] Has patient had a PCN reaction that required hospitalization/No Has patient had a PCN reaction occurring within the last 10 years: No If all of the above answers are "NO", then may proceed with Cephalosporin use.     Marland Kitchen Phenytoin Sodium Extended Swelling    Swelling   . Vicodin [Hydrocodone-Acetaminophen] Nausea And Vomiting    Current Outpatient Prescriptions  on File Prior to Visit  Medication Sig Dispense Refill  . acetaminophen-codeine (TYLENOL #3) 300-30 MG tablet TK 1 T PO TID PRN P  0  . ADVAIR HFA 230-21 MCG/ACT inhaler INHALE 2 PUFFS INTO THE LUNGS TWICE DAILY 12 g 0  . albuterol (PROVENTIL HFA) 108 (90 BASE) MCG/ACT inhaler Inhale 2 puffs into the lungs every 6 (six) hours as needed for wheezing or shortness of breath. 1 Inhaler 0  . albuterol (PROVENTIL) (2.5 MG/3ML) 0.083% nebulizer solution Take 2.5 mg by nebulization every 4 (four) hours as needed for wheezing or shortness of breath.    Marland Kitchen azelastine (ASTELIN) 0.1 % nasal spray USE 1-2 SPRAYS IN EACH NOSTRIL TWICE DAILY AS NEEDED 30 mL 5  . Biotin 1 MG CAPS Take 1 mg by mouth as needed (muscles).     . cyclobenzaprine (FLEXERIL) 10 MG tablet Take 1 tablet (10 mg  total) by mouth 2 (two) times daily as needed for muscle spasms. 20 tablet 0  . diltiazem (CARTIA XT) 120 MG 24 hr capsule Take 1 capsule (120 mg total) by mouth daily. KEEP OV. 30 capsule 2  . DiphenhydrAMINE HCl (BENADRYL PO) Take 1 tablet by mouth daily as needed (itching).     . DULoxetine (CYMBALTA) 30 MG capsule Take 30 mg by mouth daily.    . Empagliflozin-Linagliptin (GLYXAMBI) 10-5 MG TABS Take 1 tablet by mouth daily.    Marland Kitchen EPINEPHRINE 0.3 mg/0.3 mL IJ SOAJ injection USE AS DIRECTED FOR LIFE THREATENING ALLERGIC REACTIONS 2 Device 2  . fluticasone (FLOVENT HFA) 220 MCG/ACT inhaler Inhale 2 puffs into the lungs 2 (two) times daily as needed. 1 Inhaler 5  . furosemide (LASIX) 40 MG tablet Take 1 tablet (40 mg total) by mouth daily. 30 tablet 2  . gabapentin (NEURONTIN) 300 MG capsule Take 600 mg by mouth 3 (three) times daily.  6  . hydrOXYzine (VISTARIL) 25 MG capsule TAKE ONE CAPSULE BY MOUTH EVERY 8 HOURS AS NEEDED  3  . Lactase (DAIRY-RELIEF PO) Take 1 capsule by mouth daily as needed (stomach upset).     . Magnesium 250 MG TABS Take 1 tablet by mouth daily.    . montelukast (SINGULAIR) 10 MG tablet Take one tablet once a day. 30 tablet 5  . nystatin cream (MYCOSTATIN) Apply 1 application topically 2 (two) times daily. Continue 48 hours after rash resolved. 30 g 1  . Olopatadine HCl 0.2 % SOLN INSTILL 1 DROP INTO BOTH EYES ONCE DAILY FOR ITCHY EYES AS NEEDED 7.5 mL 1  . Omega 3-6-9 Fatty Acids (OMEGA 3-6-9 COMPLEX PO) Take 2 capsules by mouth daily. Reported on 08/02/2015    . ondansetron (ZOFRAN-ODT) 4 MG disintegrating tablet Take 4 mg by mouth as needed.  0  . ONETOUCH DELICA LANCETS 99991111 MISC as needed (blood check).     [name] VERIO test strip 1 each by Other route as needed (blood monitoring).     . pantoprazole (PROTONIX) 40 MG tablet TAKE 1 TABLET BY MOUTH EVERY DAY 90 tablet 1  . ranitidine (ZANTAC) 300 MG tablet TAKE 1 TABLET BY MOUTH EVERY NIGHT AT BEDTIME 90 tablet 0  .  sodium chloride (OCEAN) 0.65 % SOLN nasal spray Place 2 sprays into both nostrils daily as needed for congestion (congestion).     . solifenacin (VESICARE) 10 MG tablet Take 1 tablet (10 mg total) by mouth daily. 30 tablet 11  . SPIRIVA RESPIMAT 1.25 MCG/ACT AERS INHALE 2 PUFFS BY MOUTH EVERY DAY 4  g 5  . SUMAtriptan (IMITREX) 100 MG tablet Take 100 mg x 1 may, repeat in 2 hour for migraine. 10 tablet 0  . valsartan (DIOVAN) 160 MG tablet TAKE 1 TABLET BY MOUTH EVERY DAY 30 tablet 8  . XARELTO 20 MG TABS tablet TAKE ONE TABLET BY MOUTH EACH EVENING WITH DINNER. 30 tablet 10   Current Facility-Administered Medications on File Prior to Visit  Medication Dose Route Frequency Provider Last Rate Last Dose  . omalizumab Arvid Right) injection 300 mg  300 mg Subcutaneous Q28 days [name], MD   300 mg at 08/14/16 1129    BP 134/82 (BP Location: Left Arm, Patient Position: Sitting, Cuff Size: Large)   Pulse 77   Temp 98.1 F (36.7 C) (Oral)   Resp 16   Ht 5' 4.5" (1.638 m)   Wt 239 lb (108.4 kg)   SpO2 97%   BMI 40.39 kg/m       Objective:   Physical Exam  Constitutional: She is oriented to person, place, and time. She appears well-developed and well-nourished. No distress.  Neck: Normal range of motion. Neck supple.  Cardiovascular: Normal rate, regular rhythm, normal heart sounds and intact distal pulses.  Exam reveals no gallop and no friction rub.   No murmur heard. Pulmonary/Chest: Effort normal and breath sounds normal. No respiratory distress. She has no wheezes. She has no rales. She exhibits no tenderness.  Lymphadenopathy:    She has no cervical adenopathy.  Neurological: She is alert and oriented to person, place, and time.  Skin: Skin is warm and dry. No rash noted. She is not diaphoretic. No erythema. No pallor.  Psychiatric: She has a normal mood and affect. Her behavior is normal. Judgment and thought content normal.  Nursing note and vitals reviewed.     Assessment &  Plan:  1. Fatigue, unspecified type - Possible from Vesicare or viral illness? - Advised not to stop taking Vesicare until she talks with PCP  - Stay hydrated   Dorothyann Peng, NP

## 2016-09-03 ENCOUNTER — Telehealth: Payer: Self-pay | Admitting: *Deleted

## 2016-09-03 ENCOUNTER — Telehealth: Payer: Self-pay

## 2016-09-03 NOTE — Telephone Encounter (Signed)
Ms. Brekken notified by telephone to hold Vesicare for 1 to 2 weeks, if she thinks it is making her tired.  If not feeling better off the medication, she will need to come in to see Dr. Diona Browner to be re-evaluated.  Patient states understanding.

## 2016-09-03 NOTE — Telephone Encounter (Signed)
FYI:   This Probation officer pulls a message from team health on patient that came in last Friday evening.  Patient called reporting symptoms of extreme fatigue since taking Vesicare and concerned.  Team health set up appointment for patient with Beaulah Dinning, NP at [facility] on 09/01/16.  (please review visit for further details).

## 2016-09-03 NOTE — Progress Notes (Signed)
Let pt know she can try a trial off vesicare. If not improving in 2 weeks off med, restart and make follow up appt.

## 2016-09-03 NOTE — Telephone Encounter (Signed)
-----   Message from [name], MD sent at 09/03/2016  4:43 PM EST -----  I wrote a note, but I am note sure where it went!  Please let her know if she thinks the vesicare is making her tired, she can hold it for 1-2 weeks. If not feeling better off it, she can come in to be re-evaluated. ----- Message ----- From: Carter Kitten, CMA Sent: 09/03/2016  12:11 PM To: [name], MD  You cc'd this chart to me.  Am I suppose to be doing something with her.  I don't see any instructions.  [name]

## 2016-09-10 DIAGNOSIS — J4540 Moderate persistent asthma, uncomplicated: Secondary | ICD-10-CM | POA: Diagnosis not present

## 2016-09-11 ENCOUNTER — Ambulatory Visit (INDEPENDENT_AMBULATORY_CARE_PROVIDER_SITE_OTHER): Payer: Medicare Other | Admitting: *Deleted

## 2016-09-11 DIAGNOSIS — J4540 Moderate persistent asthma, uncomplicated: Secondary | ICD-10-CM

## 2016-09-18 ENCOUNTER — Ambulatory Visit: Payer: Medicare Other | Admitting: Allergy and Immunology

## 2016-09-24 ENCOUNTER — Other Ambulatory Visit: Payer: Self-pay | Admitting: Allergy and Immunology

## 2016-09-24 DIAGNOSIS — G8929 Other chronic pain: Secondary | ICD-10-CM | POA: Diagnosis not present

## 2016-09-24 DIAGNOSIS — M25511 Pain in right shoulder: Secondary | ICD-10-CM | POA: Diagnosis not present

## 2016-09-24 DIAGNOSIS — M25561 Pain in right knee: Secondary | ICD-10-CM | POA: Diagnosis not present

## 2016-09-27 DIAGNOSIS — Z1231 Encounter for screening mammogram for malignant neoplasm of breast: Secondary | ICD-10-CM | POA: Diagnosis not present

## 2016-09-27 DIAGNOSIS — Z803 Family history of malignant neoplasm of breast: Secondary | ICD-10-CM | POA: Diagnosis not present

## 2016-10-02 ENCOUNTER — Ambulatory Visit: Payer: Medicare Other | Admitting: Allergy and Immunology

## 2016-10-02 ENCOUNTER — Encounter: Payer: Self-pay | Admitting: Family Medicine

## 2016-10-08 DIAGNOSIS — J4540 Moderate persistent asthma, uncomplicated: Secondary | ICD-10-CM

## 2016-10-08 DIAGNOSIS — I519 Heart disease, unspecified: Secondary | ICD-10-CM | POA: Diagnosis not present

## 2016-10-08 DIAGNOSIS — I272 Pulmonary hypertension, unspecified: Secondary | ICD-10-CM | POA: Diagnosis not present

## 2016-10-08 DIAGNOSIS — K219 Gastro-esophageal reflux disease without esophagitis: Secondary | ICD-10-CM | POA: Diagnosis not present

## 2016-10-08 DIAGNOSIS — J3089 Other allergic rhinitis: Secondary | ICD-10-CM | POA: Diagnosis not present

## 2016-10-09 ENCOUNTER — Ambulatory Visit (INDEPENDENT_AMBULATORY_CARE_PROVIDER_SITE_OTHER): Payer: Medicare Other | Admitting: Allergy and Immunology

## 2016-10-09 ENCOUNTER — Telehealth: Payer: Self-pay

## 2016-10-09 ENCOUNTER — Encounter: Payer: Self-pay | Admitting: Allergy and Immunology

## 2016-10-09 VITALS — BP 110/60 | HR 106 | Resp 16 | Ht 65.0 in | Wt 233.0 lb

## 2016-10-09 DIAGNOSIS — I272 Pulmonary hypertension, unspecified: Secondary | ICD-10-CM | POA: Diagnosis not present

## 2016-10-09 DIAGNOSIS — J3089 Other allergic rhinitis: Secondary | ICD-10-CM | POA: Diagnosis not present

## 2016-10-09 DIAGNOSIS — I519 Heart disease, unspecified: Secondary | ICD-10-CM

## 2016-10-09 DIAGNOSIS — J4540 Moderate persistent asthma, uncomplicated: Secondary | ICD-10-CM | POA: Diagnosis not present

## 2016-10-09 DIAGNOSIS — I5189 Other ill-defined heart diseases: Secondary | ICD-10-CM

## 2016-10-09 DIAGNOSIS — K219 Gastro-esophageal reflux disease without esophagitis: Secondary | ICD-10-CM

## 2016-10-09 NOTE — Patient Instructions (Addendum)
  1. Continue Advair 230 - 2 inhalations twice a day  2. Continue Flovent 220 2 inhalations twice a day added to Advair during "Flare UP"  3. Continue Spiriva 2.5 respimat two inhalations one time per day.  4. Continue nasal azelastine two sprays each nostril 1-2 times per day. May also continue nasal saline washes.  5. Continue pantoprazole 40mg  in the AM and Ranitidine 300mg  in the PM  6. Continue Xolair and Epi-Pen  7. Continue Montelukast 10mg  One tablet one time per day.  8. Use Provental HFA or albuterol nebulization if needed.   9. Return to clinic in Summer 2018 or earlier if there is a problem.

## 2016-10-09 NOTE — Telephone Encounter (Signed)
She is buy and bill. Take from buy and bill and contact patient to get injs.

## 2016-10-09 NOTE — Telephone Encounter (Signed)
Patient needs Xolair ordered. Somehow was not placed on list. Please and thank you. Patient will come next week.

## 2016-10-09 NOTE — Progress Notes (Signed)
Follow-up Note  Referring Provider: [name], MD Primary Provider: [name], MD Date of Office Visit: 10/09/2016  Subjective:   [name] (DOB: [birth year]) is a 71 y.o. female who returns to the [facility] on 10/09/2016 in re-evaluation of the following:  HPI: [name] returns to this clinic in reevaluation of her asthma and allergic rhinitis and reflux and a history of pulmonary hypertension and diastolic dysfunction. I last saw her in this clinic in December 2017.  Overall she has done very well with her asthma. She did have an exacerbation treated with her action plan successfully that appeared to be triggered by an upper respiratory tract infection this winter. Otherwise, she rarely uses a short acting bronchodilator. She does not really exercise to any large degree. She continues on Advair and Spiriva and Xolair.  Her nose has been doing very well. She has not required an antibiotic to treat a episode of sinusitis.  Her reflux and her throat has been doing very well while consistently using therapy directed against reflux which includes a proton pump inhibitor and H2 receptor blocker.  Allergies as of 10/09/2016      Reactions   Lyrica [pregabalin] Other (See Comments)   Blurred vision.   Penicillins Swelling   Has patient had a PCN reaction causing immediate rash, facial/tongue/throat swelling, SOB or lightheadedness with hypotension patient had a PCN reaction causing severe rash involving mucus membranes or skin necrosis: [id number] Has patient had a PCN reaction that required hospitalization/No Has patient had a PCN reaction occurring within the last 10 years: No If all of the above answers are "NO", then may proceed with Cephalosporin use.   Phenytoin Sodium Extended Swelling   Swelling    Vicodin [hydrocodone-acetaminophen] Nausea And Vomiting      Medication List      acetaminophen-codeine 300-30 MG tablet Commonly known as:  TYLENOL  #3 TK 1 T PO TID PRN P   ADVAIR HFA 230-21 MCG/ACT inhaler Generic drug:  fluticasone-salmeterol INHALE 2 PUFFS INTO THE LUNGS TWICE DAILY   albuterol (2.5 MG/3ML) 0.083% nebulizer solution Commonly known as:  PROVENTIL Take 2.5 mg by nebulization every 4 (four) hours as needed for wheezing or shortness of breath.   albuterol 108 (90 Base) MCG/ACT inhaler Commonly known as:  PROVENTIL HFA Inhale 2 puffs into the lungs every 6 (six) hours as needed for wheezing or shortness of breath.   azelastine 0.1 % nasal spray Commonly known as:  ASTELIN USE 1-2 SPRAYS IN EACH NOSTRIL TWICE DAILY AS NEEDED   BENADRYL PO Take 1 tablet by mouth daily as needed (itching).   Biotin 1 MG Caps Take 1 mg by mouth as needed (muscles).   cyclobenzaprine 10 MG tablet Commonly known as:  FLEXERIL Take 1 tablet (10 mg total) by mouth 2 (two) times daily as needed for muscle spasms.   DAIRY-RELIEF PO Take 1 capsule by mouth daily as needed (stomach upset).   diltiazem 120 MG 24 hr capsule Commonly known as:  CARTIA XT Take 1 capsule (120 mg total) by mouth daily. KEEP OV.   DULoxetine 30 MG capsule Commonly known as:  CYMBALTA Take 30 mg by mouth daily.   EPINEPHrine 0.3 mg/0.3 mL Soaj injection Commonly known as:  EPI-PEN USE AS DIRECTED FOR LIFE THREATENING ALLERGIC REACTIONS   fluticasone 220 MCG/ACT inhaler Commonly known as:  FLOVENT HFA Inhale 2 puffs into the lungs 2 (two) times daily as needed.   furosemide 40 MG  tablet Commonly known as:  LASIX Take 1 tablet (40 mg total) by mouth daily.   gabapentin 300 MG capsule Commonly known as:  NEURONTIN Take 600 mg by mouth 3 (three) times daily.   GLYXAMBI 10-5 MG Tabs Generic drug:  Empagliflozin-Linagliptin Take 1 tablet by mouth daily.   hydrOXYzine 25 MG capsule Commonly known as:  VISTARIL TAKE ONE CAPSULE BY MOUTH EVERY 8 HOURS AS NEEDED   Magnesium 250 MG Tabs Take 1 tablet by mouth daily.   montelukast 10 MG  tablet Commonly known as:  SINGULAIR Take one tablet once a day.   nystatin cream Commonly known as:  MYCOSTATIN Apply 1 application topically 2 (two) times daily. Continue 48 hours after rash resolved.   Olopatadine HCl 0.2 % Soln INSTILL 1 DROP INTO BOTH EYES ONCE DAILY FOR ITCHY EYES AS NEEDED   OMEGA 3-6-9 COMPLEX PO Take 2 capsules by mouth daily. Reported on 08/02/2015   ondansetron 4 MG disintegrating tablet Commonly known as:  ZOFRAN-ODT Take 4 mg by mouth as needed.   ONETOUCH DELICA LANCETS 51O Misc as needed (blood check).   ONETOUCH VERIO test strip Generic drug:  glucose blood 1 each by Other route as needed (blood monitoring).   pantoprazole 40 MG tablet Commonly known as:  PROTONIX TAKE 1 TABLET BY MOUTH EVERY DAY   ranitidine 300 MG tablet Commonly known as:  ZANTAC TAKE 1 TABLET BY MOUTH EVERY NIGHT AT BEDTIME   sodium chloride 0.65 % Soln nasal spray Commonly known as:  OCEAN Place 2 sprays into both nostrils daily as needed for congestion (congestion).   solifenacin 10 MG tablet Commonly known as:  VESICARE Take 1 tablet (10 mg total) by mouth daily.   SPIRIVA RESPIMAT 1.25 MCG/ACT Aers Generic drug:  Tiotropium Bromide Monohydrate INHALE 2 PUFFS BY MOUTH EVERY DAY   SUMAtriptan 100 MG tablet Commonly known as:  IMITREX Take 100 mg x 1 may, repeat in 2 hour for migraine.   valsartan 160 MG tablet Commonly known as:  DIOVAN TAKE 1 TABLET BY MOUTH EVERY DAY   XARELTO 20 MG Tabs tablet Generic drug:  rivaroxaban TAKE ONE TABLET BY MOUTH EACH EVENING WITH DINNER.       Past Medical History:  Diagnosis Date  . Allergic rhinitis   . Allergy   . Arthritis   . Asthma   . Chronic headache   . Diabetes mellitus   . DVT (deep venous thrombosis) ([place])   . Dyspnea   . Heart murmur   . Hypertension   . Pulmonary embolism ([place])   . Pulmonary hypertension   . Seizures ([place])     Past Surgical History:  Procedure Laterality Date  .  ABDOMINAL HYSTERECTOMY     partial, has ovaries  . CARDIAC CATHETERIZATION  12/28/2010   Mod. pulmonary hypertension, normal coronary arteries  . DOPPLER ECHOCARDIOGRAPHY  10/08/2011   EF=>55%,mild asymmetric LVH, mod. TR, mod. PH, mild to mod LA dilatation  . KNEE ARTHROSCOPY Left   . KNEE SURGERY    . Nuclear Stress Test  05/20/2006   No ischemia  . PARTIAL HYSTERECTOMY    . PLANTAR FASCIA SURGERY    . TONSILLECTOMY      Review of systems negative except as noted in HPI / PMHx or noted below:  Review of Systems  Constitutional: Negative.   HENT: Negative.   Eyes: Negative.   Respiratory: Negative.   Cardiovascular: Negative.   Gastrointestinal: Negative.   Genitourinary: Negative.   Musculoskeletal: Negative.  Skin: Negative.   Neurological: Negative.   Endo/Heme/Allergies: Negative.   Psychiatric/Behavioral: Negative.      Objective:   Vitals:   10/09/16 1124  BP: 110/60  Pulse: (!) 106  Resp: 16   Height: 5\' 5"  (165.1 cm)  Weight: 233 lb (105.7 kg)   Physical Exam  Constitutional: She is well-developed, well-nourished, and in no distress.  HENT:  Head: Normocephalic.  Right Ear: Tympanic membrane, external ear and ear canal normal.  Left Ear: Tympanic membrane, external ear and ear canal normal.  Nose: Nose normal. No mucosal edema or rhinorrhea.  Mouth/Throat: Uvula is midline, oropharynx is clear and moist and mucous membranes are normal. No oropharyngeal exudate.  Eyes: Conjunctivae are normal.  Neck: Trachea normal. No tracheal tenderness present. No tracheal deviation present. No thyromegaly present.  Cardiovascular: Normal rate, regular rhythm, S1 normal, S2 normal and normal heart sounds.   No murmur heard. Pulmonary/Chest: Breath sounds normal. No stridor. No respiratory distress. She has no wheezes. She has no rales.  Musculoskeletal: She exhibits no edema.  Lymphadenopathy:       Head (right side): No tonsillar adenopathy present.       Head  (left side): No tonsillar adenopathy present.    She has no cervical adenopathy.  Neurological: She is alert. Gait normal.  Skin: No rash noted. She is not diaphoretic. No erythema. Nails show no clubbing.  Psychiatric: Mood and affect normal.    Diagnostics:    Spirometry was performed and demonstrated an FEV1 of 1.91 at 81 % of predicted.  Assessment and Plan:   1. Asthma, moderate persistent, well-controlled   2. Other allergic rhinitis   3. Gastroesophageal reflux disease, esophagitis presence not specified   4. Pulmonary hypertension   5. Diastolic dysfunction     1. Continue Advair 230 - 2 inhalations twice a day  2. Continue Flovent 220 2 inhalations twice a day added to Advair during "FLARE UP"  3. Continue Spiriva 2.5 respimat two inhalations one time per day.  4. Continue nasal azelastine two sprays each nostril 1-2 times per day. May also continue nasal saline washes.  5. Continue pantoprazole 40mg  in the AM and Ranitidine 300mg  in the PM  6. Continue Xolair and Epi-Pen  7. Continue Montelukast 10mg  One tablet one time per day.  8. Use Provental HFA or albuterol nebulization if needed.   9. Return to clinic in Summer 2018 or earlier if there is a problem.  [name] appears to be doing relatively well at this point in time and we'll continue her on therapy which includes anti-inflammatory medications for her respiratory tract and aggressive therapy directed against reflux. If she has difficulty in the face of this treatment she will contact me for further evaluation but otherwise I will see her back in this clinic in the summer of 2018.  Allena Katz, MD Allergy / Immunology [place]

## 2016-10-15 ENCOUNTER — Other Ambulatory Visit: Payer: Self-pay | Admitting: Allergy and Immunology

## 2016-10-19 ENCOUNTER — Other Ambulatory Visit: Payer: Self-pay | Admitting: Allergy and Immunology

## 2016-10-24 ENCOUNTER — Other Ambulatory Visit: Payer: Self-pay | Admitting: Allergy and Immunology

## 2016-10-24 DIAGNOSIS — H9313 Tinnitus, bilateral: Secondary | ICD-10-CM | POA: Diagnosis not present

## 2016-10-24 DIAGNOSIS — H903 Sensorineural hearing loss, bilateral: Secondary | ICD-10-CM | POA: Diagnosis not present

## 2016-10-24 MED ORDER — LEVALBUTEROL HCL 1.25 MG/3ML IN NEBU: 1.2500 mg | INHALATION_SOLUTION | RESPIRATORY_TRACT | 0 refills | Status: DC | PRN

## 2016-10-24 NOTE — Telephone Encounter (Signed)
Levo albuterol is finding to:

## 2016-10-24 NOTE — Telephone Encounter (Signed)
Left message to return call need to clarify albuterol for neb all I see in chart in albuterol and not levalbuterol

## 2016-10-24 NOTE — Telephone Encounter (Signed)
Spoke to patient she read off what her box said advised we would send in Dr Neldon Mc please advise your previous ov note says patient is on albuterol please advise what we should send in

## 2016-10-24 NOTE — Telephone Encounter (Signed)
rx sent in 

## 2016-10-24 NOTE — Telephone Encounter (Signed)
Patient needs to have levalbuterol called into [facility] [address] [place]

## 2016-11-01 ENCOUNTER — Ambulatory Visit (INDEPENDENT_AMBULATORY_CARE_PROVIDER_SITE_OTHER): Payer: Medicare Other | Admitting: Family Medicine

## 2016-11-01 ENCOUNTER — Encounter: Payer: Self-pay | Admitting: Family Medicine

## 2016-11-01 DIAGNOSIS — R682 Dry mouth, unspecified: Secondary | ICD-10-CM | POA: Diagnosis not present

## 2016-11-01 DIAGNOSIS — N3941 Urge incontinence: Secondary | ICD-10-CM

## 2016-11-01 NOTE — Progress Notes (Signed)
Pre visit review using our clinic review tool, if applicable. No additional management support is needed unless otherwise documented below in the visit note. 

## 2016-11-01 NOTE — Assessment & Plan Note (Signed)
Good control with vesicare but dry mouth and fatigue bothersome. Stop for now but can consider lower dose 5 mg at next appt if incontinence very bothersome.

## 2016-11-01 NOTE — Patient Instructions (Signed)
Stop vesicare for now. If interested when you follow up next we can try a trial of 5 mg daily vesicare.

## 2016-11-01 NOTE — Progress Notes (Signed)
   Subjective:    Patient ID: [name], female    DOB: Apr 01, 1946, 71 y.o.   MRN: [id number]  HPI    71 year old female presents to discuss medications for urinary incontinence.  Detrol LA ineffective and SE to Myrbetriq.  She is on vesicare.. Causing a lot of bothersome dry mouth. No leaking of urine, able to make to bathroom, not wearing a pad. Gets up 1-2 times a night to urinate, pad staying dry in panties at night.   Blood pressure 120/70, pulse 90, temperature 97.8 F (36.6 C), temperature source Oral, height 5\' 5"  (1.651 m), weight 233 lb (105.7 kg).   Review of Systems  Constitutional: Positive for fatigue. Negative for fever.  HENT: Negative for ear pain.   Eyes: Negative for pain.  Respiratory: Negative for chest tightness and shortness of breath.   Cardiovascular: Negative for chest pain, palpitations and leg swelling.  Gastrointestinal: Negative for abdominal pain.  Genitourinary: Negative for dysuria.       Objective:   Physical Exam  Constitutional: Vital signs are normal. She appears well-developed and well-nourished. She is cooperative.  Non-toxic appearance. She does not appear ill. No distress.  overweight  HENT:  Head: Normocephalic.  Right Ear: Hearing, tympanic membrane, external ear and ear canal normal. Tympanic membrane is not erythematous, not retracted and not bulging.  Left Ear: Hearing, tympanic membrane, external ear and ear canal normal. Tympanic membrane is not erythematous, not retracted and not bulging.  Nose: No mucosal edema or rhinorrhea. Right sinus exhibits no maxillary sinus tenderness and no frontal sinus tenderness. Left sinus exhibits no maxillary sinus tenderness and no frontal sinus tenderness.  Mouth/Throat: Uvula is midline, oropharynx is clear and moist and mucous membranes are normal.  Eyes: Conjunctivae, EOM and lids are normal. Pupils are equal, round, and reactive to light. Lids are everted and swept, no foreign bodies  found.  Neck: Trachea normal and normal range of motion. Neck supple. Carotid bruit is not present. No thyroid mass and no thyromegaly present.  Cardiovascular: Normal rate, regular rhythm, S1 normal, S2 normal, normal heart sounds, intact distal pulses and normal pulses.  Exam reveals no gallop and no friction rub.   No murmur heard. Pulmonary/Chest: Effort normal and breath sounds normal. No tachypnea. No respiratory distress. She has no decreased breath sounds. She has no wheezes. She has no rhonchi. She has no rales.  Abdominal: Soft. Normal appearance and bowel sounds are normal. There is no tenderness.  Neurological: She is alert.  Skin: Skin is warm, dry and intact. No rash noted.  Psychiatric: Her speech is normal and behavior is normal. Judgment and thought content normal. Her mood appears not anxious. Cognition and memory are normal. She does not exhibit a depressed mood.          Assessment & Plan:

## 2016-11-01 NOTE — Assessment & Plan Note (Signed)
Stop vesicare as causing this SE.

## 2016-11-02 ENCOUNTER — Other Ambulatory Visit: Payer: Self-pay

## 2016-11-02 NOTE — Telephone Encounter (Signed)
Pt left v/m requesting refill im[facility]rex to [facility]. Last refilled # 10 on 08/24/14; pt was seen 11/01/16.

## 2016-11-05 MED ORDER — SUMATRIPTAN SUCCINATE 100 MG PO TABS: ORAL_TABLET | ORAL | 0 refills | Status: DC

## 2016-11-06 ENCOUNTER — Ambulatory Visit: Payer: Self-pay

## 2016-11-08 DIAGNOSIS — M79641 Pain in right hand: Secondary | ICD-10-CM | POA: Diagnosis not present

## 2016-11-09 ENCOUNTER — Ambulatory Visit: Payer: Self-pay

## 2016-11-12 DIAGNOSIS — J4540 Moderate persistent asthma, uncomplicated: Secondary | ICD-10-CM

## 2016-11-13 ENCOUNTER — Ambulatory Visit (INDEPENDENT_AMBULATORY_CARE_PROVIDER_SITE_OTHER): Payer: Medicare Other | Admitting: *Deleted

## 2016-11-13 DIAGNOSIS — J4540 Moderate persistent asthma, uncomplicated: Secondary | ICD-10-CM | POA: Diagnosis not present

## 2016-11-15 DIAGNOSIS — M25531 Pain in right wrist: Secondary | ICD-10-CM | POA: Diagnosis not present

## 2016-11-21 ENCOUNTER — Telehealth: Payer: Self-pay | Admitting: Family Medicine

## 2016-11-21 ENCOUNTER — Encounter (HOSPITAL_COMMUNITY): Payer: Self-pay | Admitting: Emergency Medicine

## 2016-11-21 ENCOUNTER — Emergency Department (HOSPITAL_COMMUNITY)
Admission: EM | Admit: 2016-11-21 | Discharge: 2016-11-21 | Disposition: A | Discharge status: Home / Self Care | Payer: Medicare Other | Attending: Emergency Medicine | Admitting: Emergency Medicine

## 2016-11-21 ENCOUNTER — Emergency Department (HOSPITAL_COMMUNITY): Payer: Medicare Other

## 2016-11-21 DIAGNOSIS — M545 Low back pain, unspecified: Secondary | ICD-10-CM

## 2016-11-21 DIAGNOSIS — E1140 Type 2 diabetes mellitus with diabetic neuropathy, unspecified: Secondary | ICD-10-CM | POA: Insufficient documentation

## 2016-11-21 DIAGNOSIS — J45909 Unspecified asthma, uncomplicated: Secondary | ICD-10-CM | POA: Insufficient documentation

## 2016-11-21 DIAGNOSIS — I10 Essential (primary) hypertension: Secondary | ICD-10-CM | POA: Diagnosis not present

## 2016-11-21 DIAGNOSIS — M5441 Lumbago with sciatica, right side: Secondary | ICD-10-CM

## 2016-11-21 DIAGNOSIS — M5442 Lumbago with sciatica, left side: Principal | ICD-10-CM

## 2016-11-21 LAB — COMPREHENSIVE METABOLIC PANEL
ALT: 12 U/L — ABNORMAL LOW (ref 14–54)
AST: 19 U/L (ref 15–41)
Albumin: 3.9 g/dL (ref 3.5–5.0)
Alkaline Phosphatase: 81 U/L (ref 38–126)
Anion gap: 10 (ref 5–15)
BUN: 12 mg/dL (ref 6–20)
CO2: 24 mmol/L (ref 22–32)
Calcium: 9.3 mg/dL (ref 8.9–10.3)
Chloride: 105 mmol/L (ref 101–111)
Creatinine, Ser: 1.2 mg/dL — ABNORMAL HIGH (ref 0.44–1.00)
GFR calc Af Amer: 52 mL/min — ABNORMAL LOW (ref >60)
GFR calc non Af Amer: 45 mL/min — ABNORMAL LOW (ref >60)
Glucose, Bld: 127 mg/dL — ABNORMAL HIGH (ref 65–99)
Potassium: 4.1 mmol/L (ref 3.5–5.1)
Sodium: 139 mmol/L (ref 135–145)
Total Bilirubin: 1 mg/dL (ref 0.3–1.2)
Total Protein: 7.1 g/dL (ref 6.5–8.1)

## 2016-11-21 LAB — CBC
HCT: 41.9 % (ref 36.0–46.0)
Hemoglobin: 14.3 g/dL (ref 12.0–15.0)
MCH: 25.4 pg — ABNORMAL LOW (ref 26.0–34.0)
MCHC: 34.1 g/dL (ref 30.0–36.0)
MCV: 74.4 fL — ABNORMAL LOW (ref 78.0–100.0)
Platelets: 265 10*3/uL (ref 150–400)
RBC: 5.63 MIL/uL — ABNORMAL HIGH (ref 3.87–5.11)
RDW: 15.2 % (ref 11.5–15.5)
WBC: 9.8 10*3/uL (ref 4.0–10.5)

## 2016-11-21 LAB — URINALYSIS, ROUTINE W REFLEX MICROSCOPIC
Bacteria, UA: NONE SEEN
Bilirubin Urine: NEGATIVE
Glucose, UA: >=500 mg/dL — AB
Ketones, ur: 5 mg/dL — AB
Leukocytes, UA: NEGATIVE
Nitrite: NEGATIVE
Protein, ur: NEGATIVE mg/dL
Specific Gravity, Urine: 1.015 (ref 1.005–1.030)
pH: 5 (ref 5.0–8.0)

## 2016-11-21 LAB — LIPASE, BLOOD: Lipase: 15 U/L (ref 11–51)

## 2016-11-21 MED ORDER — CYCLOBENZAPRINE HCL 10 MG PO TABS
10.0000 mg | ORAL_TABLET | Freq: Two times a day (BID) | ORAL | 0 refills | Status: DC | PRN
Start: 2016-11-21 — End: 2018-04-01

## 2016-11-21 MED ORDER — ACETAMINOPHEN-CODEINE #3 300-30 MG PO TABS: 1.0000 | ORAL_TABLET | Freq: Four times a day (QID) | ORAL | 0 refills | Status: DC | PRN

## 2016-11-21 NOTE — ED Triage Notes (Signed)
Pt c/o left groin pain radiating to left abdomen and groin onset last night, worse this morning. No n/v, no vaginal discharge. 1 episode diarrhea last night.

## 2016-11-21 NOTE — ED Provider Notes (Signed)
Medications - No data to display  Patient Vitals for the past 24 hrs:  BP Temp Temp src Pulse Resp SpO2  11/21/16 2153 (!) 167/86 - - (!) 107 18 95 %  11/21/16 1855 132/80 98 F (36.7 C) Oral (!) 108 18 100 %     Face-to-face evaluation   History: She presents for evaluation of abdominal and groin pain, which started yesterday.  She has chronic right knee pain for which she takes Tylenol with codeine.  She also recently took prednisone for hand arthritis about.  She denies fever cough weakness or dizziness.  Physical exam: Obese elderly female who is uncomfortable.  She is restless.  Left groin tender without mass.  Decreased motion left leg secondary to pain left groin.  Back with moderate right lumbar tenderness.  Normal range of motion back.  Medical screening examination/treatment/procedure(s) were conducted as a shared visit with non-physician practitioner(s) and myself.  I personally evaluated the patient during the encounter   Daleen Bo, MD 11/22/16 [phone]

## 2016-11-21 NOTE — Discharge Instructions (Signed)
Take Tylenol as needed for pain. Take Flexeril as needed for spasms. Follow-up with PCP for further evaluation if needed. Return to ED for worsening pain, additional injury, trouble walking, numbness, loss of bladder function.

## 2016-11-21 NOTE — Telephone Encounter (Signed)
[facility]     Patient Name: [name] Initial Comment Caller states she is having lower abdominal pain, all the way down to her groin area and waist   DOB: [birth year]      Nurse Assessment  Nurse: Venetia Maxon, RN, Manuela Schwartz Date/Time (Eastern Time): 11/21/2016 5:36:01 PM  Confirm and document reason for call. If symptomatic, describe symptoms. ---Caller states she is having lower abdominal pain, all the way down to her LT groin area and waist / pain began this morning. Last BM was yesterday. diarrhea . not eating . pain level 9-10/10 with movement . Last urine was 15 min ago : no burning . yellowish .  Does the patient have any new or worsening symptoms? ---Yes  Will a triage be completed? ---Yes  Related visit to physician within the last 2 weeks? ---No  Does the PT have any chronic conditions? (i.e. diabetes, asthma, etc.) ---Yes  List chronic conditions. ---HTN, NIDDM , blood thinner Xarelto PE left lung 2004 asthma thermometer not accurate  Is this a behavioral health or substance abuse call? ---No    Guidelines     Guideline Title Affirmed Question Affirmed Notes   Abdominal Pain - Female [1] SEVERE pain (e.g., excruciating) AND [2] present > 1 hour    Final Disposition User   Go to ED Now Venetia Maxon, RN, Manuela Schwartz     Referrals   [name] - ED   Disagree/Comply: Comply

## 2016-11-21 NOTE — ED Provider Notes (Signed)
[facility] DEPT Provider Note   CSN: [id number] Arrival date & time: 11/21/16  1848     History   Chief Complaint Chief Complaint  Patient presents with  . Abdominal Pain  . Back Pain    HPI [name] is a 71 y.o. female.  Patient presents with one-day history of lower back pain that radiates to the suprapubic region bilaterally. She states that she experienced similar pain to this a few years ago which was controlled with muscle relaxer. She states that this pain is worse with movement and described as "sharp and throbbing." Denies any injury or trauma that could've cause symptoms.  Reports 1 episode of loose stools yesterday. Denies blood in stool. No bowel movement today. Denies chest pain, trouble breathing, loss of bowel or bladder function, numbness, weakness, trouble walking, urinary symptoms, vaginal complaints, nausea, vomiting, constipation, blood in stool, headaches, injury.      Past Medical History:  Diagnosis Date  . Allergic rhinitis   . Allergy   . Arthritis   . Asthma   . Chronic headache   . Diabetes mellitus   . DVT (deep venous thrombosis) ([place])   . Dyspnea   . Heart murmur   . Hypertension   . Pulmonary embolism ([place])   . Pulmonary hypertension ([place])   . Seizures [facility])     Patient Active Problem List   Diagnosis Date Noted  . Dry mouth 11/01/2016  . Candidal intertrigo 07/24/2016  . Groin pain 04/27/2016  . Urge incontinence 03/27/2016  . Abnormal TSH 03/22/2016  . Moderate persistent asthma 07/12/2015  . Paroxysmal atrial fibrillation with rapid ventricular response ([place]) 06/03/2015  . Counseling regarding end of life decision making 05/05/2015  . Neuropathy due to secondary diabetes ([place]) 01/11/2015  . Pain in Achilles tendon 01/11/2015  . Contusion 11/09/2014  . Migraine headache without aura 08/24/2014  . Allergic rhinitis 03/25/2014  . DVT (deep venous thrombosis) hx of  03/25/2014  . Osteoarthritis of right knee 03/25/2014   .  ? of Seizure disorder 03/25/2014  . Depression, major, in remission ([place]) 03/25/2014  . Diabetes mellitus with neuropathy ([place]) 03/25/2014  . Essential hypertension, benign 03/25/2014  . GERD (gastroesophageal reflux disease) 03/25/2014  . History of pulmonary embolism 10/09/2013  . Hypercoagulable state ([place]) 01/29/2011  . Asthma, moderate persistent 01/24/2011  . Pulmonary hypertension ([place]) 01/11/2011    Past Surgical History:  Procedure Laterality Date  . ABDOMINAL HYSTERECTOMY     partial, has ovaries  . CARDIAC CATHETERIZATION  12/28/2010   Mod. pulmonary hypertension, normal coronary arteries  . DOPPLER ECHOCARDIOGRAPHY  10/08/2011   EF=>55%,mild asymmetric LVH, mod. TR, mod. PH, mild to mod LA dilatation  . KNEE ARTHROSCOPY Left   . KNEE SURGERY    . Nuclear Stress Test  05/20/2006   No ischemia  . PARTIAL HYSTERECTOMY    . PLANTAR FASCIA SURGERY    . TONSILLECTOMY      OB History    No data available       Home Medications    Prior to Admission medications   Medication Sig Start Date End Date Taking? Authorizing Provider  ADVAIR HFA 230-21 MCG/ACT inhaler INHALE 2 PUFFS BY MOUTH TWICE DAILY 10/19/16  Yes Kozlow, Donnamarie Poag, MD  albuterol (PROVENTIL HFA) 108 (90 BASE) MCG/ACT inhaler Inhale 2 puffs into the lungs every 6 (six) hours as needed for wheezing or shortness of breath. 06/23/15  Yes Kozlow, Donnamarie Poag, MD  azelastine (ASTELIN) 0.1 % nasal spray USE  1-2 SPRAYS IN EACH NOSTRIL TWICE DAILY AS NEEDED 07/12/15  Yes Bobbitt, [place] Muta, MD  Biotin 1 MG CAPS Take 1 mg by mouth as needed (muscles).    Yes [provider]  diltiazem (CARTIA XT) 120 MG 24 hr capsule Take 1 capsule (120 mg total) by mouth daily. KEEP OV. 04/16/16  Yes Croitoru, Mihai, MD  DULoxetine (CYMBALTA) 30 MG capsule Take 30 mg by mouth daily.   Yes [provider]  Empagliflozin-Linagliptin (GLYXAMBI) 10-5 MG TABS Take 1 tablet by mouth daily.   Yes [provider]    fluticasone (FLOVENT HFA) 220 MCG/ACT inhaler Inhale 2 puffs into the lungs 2 (two) times daily as needed. 08/09/16  Yes Valentina Shaggy, MD  furosemide (LASIX) 40 MG tablet Take 1 tablet (40 mg total) by mouth daily. 03/12/16  Yes Croitoru, Mihai, MD  Lactase (DAIRY-RELIEF PO) Take 1 capsule by mouth daily as needed (stomach upset).    Yes [provider]  levalbuterol (XOPENEX) 1.25 MG/3ML nebulizer solution Take 1.25 mg by nebulization every 4 (four) hours as needed for wheezing. 10/24/16  Yes Kozlow, Donnamarie Poag, MD  Magnesium 250 MG TABS Take 1 tablet by mouth daily.   Yes [provider]  montelukast (SINGULAIR) 10 MG tablet TAKE 1 TABLET BY MOUTH EVERY DAY 10/15/16  Yes Kozlow, Donnamarie Poag, MD  nystatin cream (MYCOSTATIN) Apply 1 application topically 2 (two) times daily. Continue 48 hours after rash resolved. 07/24/16  Yes Bedsole, Amy E, MD  Olopatadine HCl 0.2 % SOLN INSTILL 1 DROP INTO BOTH EYES ONCE DAILY FOR ITCHY EYES AS NEEDED 01/11/16  Yes Kozlow, Donnamarie Poag, MD  Omega 3-6-9 Fatty Acids (OMEGA 3-6-9 COMPLEX PO) Take 2 capsules by mouth daily. Reported on 08/02/2015   Yes [provider]  pantoprazole (PROTONIX) 40 MG tablet TAKE 1 TABLET BY MOUTH EVERY DAY 05/28/16  Yes Kozlow, Donnamarie Poag, MD  ranitidine (ZANTAC) 300 MG tablet TAKE 1 TABLET BY MOUTH EVERY NIGHT AT BEDTIME 09/24/16  Yes Kozlow, Donnamarie Poag, MD  SPIRIVA RESPIMAT 1.25 MCG/ACT AERS INHALE 2 PUFFS BY MOUTH EVERY DAY 12/09/15  Yes Kozlow, Donnamarie Poag, MD  SUMAtriptan (IMITREX) 100 MG tablet Take 100 mg x 1 may, repeat in 2 hour for migraine. 11/05/16  Yes Bedsole, Amy E, MD  valsartan (DIOVAN) 160 MG tablet TAKE 1 TABLET BY MOUTH EVERY DAY 07/18/16  Yes Croitoru, Mihai, MD  XARELTO 20 MG TABS tablet TAKE ONE TABLET BY MOUTH EACH EVENING WITH DINNER. 07/10/16  Yes Croitoru, Mihai, MD  acetaminophen-codeine (TYLENOL #3) 300-30 MG tablet Take 1-2 tablets by mouth every 6 (six) hours as needed for moderate pain. 11/21/16   Json Koelzer,  PA-C  cyclobenzaprine (FLEXERIL) 10 MG tablet Take 1 tablet (10 mg total) by mouth 2 (two) times daily as needed for muscle spasms. 11/21/16   Carie Kapuscinski, PA-C  EPINEPHRINE 0.3 mg/0.3 mL IJ SOAJ injection USE AS DIRECTED FOR LIFE THREATENING ALLERGIC REACTIONS 01/02/16   Kozlow, Donnamarie Poag, MD  gabapentin (NEURONTIN) 300 MG capsule Take 600 mg by mouth 3 (three) times daily. 04/15/15   [provider]  Jonetta Speak LANCETS 50N MISC as needed (blood check).  11/03/14   [provider]  [facility] VERIO test strip 1 each by Other route as needed (blood monitoring).  11/03/14   [provider]    Family History Family History  Problem Relation Age of Onset  . Hypertension Mother   . Clotting disorder Mother   . Breast  cancer Mother   . Arthritis Mother   . Stroke Mother   . Diabetes Mother   . Cancer Brother   . Alcohol abuse Father   . Arthritis Sister   . Diabetes Sister   . Multiple sclerosis Sister   . Allergies Other     grandson    Social History Social History  Substance Use Topics  . Smoking status: Never Smoker  . Smokeless tobacco: Never Used  . Alcohol use 0.0 oz/week     Comment: occ glass on wine     Allergies   Lyrica [pregabalin]; Penicillins; Phenytoin sodium extended; and Vicodin [hydrocodone-acetaminophen]   Review of Systems Review of Systems  Constitutional: Negative for appetite change, chills and fever.  HENT: Negative for ear pain, rhinorrhea, sneezing and sore throat.   Eyes: Negative for photophobia and visual disturbance.  Respiratory: Negative for cough, chest tightness, shortness of breath and wheezing.   Cardiovascular: Negative for chest pain and palpitations.  Gastrointestinal: Positive for abdominal pain and diarrhea. Negative for blood in stool, constipation, nausea and vomiting.  Genitourinary: Negative for dysuria, flank pain, frequency, hematuria, urgency, vaginal bleeding, vaginal discharge and vaginal pain.    Musculoskeletal: Positive for back pain. Negative for myalgias.  Skin: Negative for rash.  Neurological: Negative for dizziness, syncope, weakness and light-headedness.     Physical Exam Updated Vital Signs BP (!) 167/86 (BP Location: Left Arm)   Pulse (!) 107   Temp 98 F (36.7 C) (Oral)   Resp 18   SpO2 95%   Physical Exam  Constitutional: She appears well-developed and well-nourished. No distress.  HENT:  Head: Normocephalic and atraumatic.  Nose: Nose normal.  Eyes: Conjunctivae and EOM are normal. Right eye exhibits no discharge. Left eye exhibits no discharge. No scleral icterus.  Neck: Normal range of motion. Neck supple.  Cardiovascular: Normal rate, regular rhythm, normal heart sounds and intact distal pulses.  Exam reveals no gallop and no friction rub.   No murmur heard. Pulmonary/Chest: Effort normal and breath sounds normal. No respiratory distress.  Abdominal: Soft. Bowel sounds are normal. She exhibits no distension. There is tenderness (Left and right sided suprapubic tenderness to palpation.). There is no rebound and no guarding.  Musculoskeletal: Normal range of motion. She exhibits tenderness (Paraspinal lower lumbar tenderness to palpation.). She exhibits no edema.  No midline spinal tenderness. No objective signs of numbness noted. Normal sensation and good pulses bilaterally. No visible bruising or other signs of trauma to the back.  Neurological: She is alert. She exhibits normal muscle tone. Coordination normal.  Skin: Skin is warm and dry. No rash noted.  Psychiatric: She has a normal mood and affect.  Nursing note and vitals reviewed.    ED Treatments / Results  Labs (all labs ordered are listed, but only abnormal results are displayed) Labs Reviewed  COMPREHENSIVE METABOLIC PANEL - Abnormal; Notable for the following:       Result Value   Glucose, Bld 127 (*)    Creatinine, Ser 1.20 (*)    ALT 12 (*)    GFR calc non Af Amer 45 (*)    GFR calc  Af Amer 52 (*)    All other components within normal limits  CBC - Abnormal; Notable for the following:    RBC 5.63 (*)    MCV 74.4 (*)    MCH 25.4 (*)    All other components within normal limits  URINALYSIS, ROUTINE W REFLEX MICROSCOPIC - Abnormal; Notable for the following:  Glucose, UA >=500 (*)    Hgb urine dipstick SMALL (*)    Ketones, ur 5 (*)    Squamous Epithelial / LPF 0-5 (*)    All other components within normal limits  LIPASE, BLOOD    EKG  EKG Interpretation None       Radiology Dg Lumbar Spine Complete  Result Date: 11/21/2016 CLINICAL DATA:  Acute onset of lower back pain.  Initial encounter. EXAM: LUMBAR SPINE - COMPLETE 4+ VIEW COMPARISON:  CT of the abdomen and pelvis performed 05/19/2014 FINDINGS: There is no evidence of fracture or subluxation. Vertebral bodies demonstrate normal height and alignment. Mild intervertebral disc space narrowing is noted along the lower lumbar spine. Facet disease is noted at the lower lumbar spine. The visualized bowel gas pattern is unremarkable in appearance; air and stool are noted within the colon. The sacroiliac joints are within normal limits. A prominent calcification is again noted at the right hemipelvis. IMPRESSION: 1. No evidence of fracture or subluxation along the lumbar spine. 2. Mild degenerative change along the lower lumbar spine. Electronically Signed   By: Garald Balding M.D.   On: 11/21/2016 22:39    Procedures Procedures (including critical care time)  Medications Ordered in ED Medications - No data to display   Initial Impression / Assessment and Plan / ED Course  I have reviewed the triage vital signs and the nursing notes.  Pertinent labs & imaging results that were available during my care of the patient were reviewed by me and considered in my medical decision making (see chart for details).     Patient's history and symptoms concerning for UTI vs. Musculoskeletal strain vs. Pyelo versus  pancreatitis. CBC was unremarkable today. Lipase was unremarkable. CMP revealed mild elevation of creatinine at 1.2. Normal blood glucose. UA revealed no signs of UTI but showed trace hematuria. No CVA tenderness present. Patient is afebrile at this time with no history of fever. X-ray of the lumbar spine reveal no evidence of fracture or subluxation in the area. There is evidence of mild degenerative change. No concern for any acute spinal cord abnormalities at this time. Patient is not experiencing urinary incontinence and denies any numbness or trouble walking. She exhibits no objective signs of numbness at today's visit. Normal sensation and good pulses bilaterally.  Symptoms could be due to these degenerative changes or muscle strain in the area. Patient reports history of taking Tylenol with Codeine as prescribed by her regular doctor. She states that she has ran out of this medication. Review of narcotic database states that she had received 90 day supply approximately 4 months ago. Patient was advised to take Tylenol with Codeine as needed and will begin Flexeril  for symptomatic relief. Gave 10-15 day supply of each. Advised to follow-up with PCP for further evaluation if needed and possible physical therapy. Return precautions given.  Patient was discussed with and seen by Dr. Eulis Foster.    Final Clinical Impressions(s) / ED Diagnoses   Final diagnoses:  Bilateral low back pain without sciatica, unspecified chronicity    New Prescriptions New Prescriptions   ACETAMINOPHEN-CODEINE (TYLENOL #3) 300-30 MG TABLET    Take 1-2 tablets by mouth every 6 (six) hours as needed for moderate pain.   CYCLOBENZAPRINE (FLEXERIL) 10 MG TABLET    Take 1 tablet (10 mg total) by mouth 2 (two) times daily as needed for muscle spasms.     Delia Heady, PA-C 11/21/16 2341    Daleen Bo, MD 11/22/16 [phone]

## 2016-11-22 NOTE — Telephone Encounter (Signed)
Per chart review tab pt was seen [facility] ED on 11/21/16.

## 2016-11-26 ENCOUNTER — Ambulatory Visit
Admission: RE | Admit: 2016-11-26 | Discharge: 2016-11-26 | Disposition: A | Discharge status: Home / Self Care | Payer: Medicare Other | Source: Ambulatory Visit | Attending: Family Medicine | Admitting: Family Medicine

## 2016-11-26 ENCOUNTER — Encounter: Payer: Self-pay | Admitting: Family Medicine

## 2016-11-26 ENCOUNTER — Ambulatory Visit (INDEPENDENT_AMBULATORY_CARE_PROVIDER_SITE_OTHER): Payer: Medicare Other | Admitting: Family Medicine

## 2016-11-26 DIAGNOSIS — M533 Sacrococcygeal disorders, not elsewhere classified: Secondary | ICD-10-CM | POA: Diagnosis not present

## 2016-11-26 DIAGNOSIS — R102 Pelvic and perineal pain: Secondary | ICD-10-CM | POA: Diagnosis not present

## 2016-11-26 DIAGNOSIS — M5136 Other intervertebral disc degeneration, lumbar region: Secondary | ICD-10-CM | POA: Insufficient documentation

## 2016-11-26 DIAGNOSIS — E0840 Diabetes mellitus due to underlying condition with diabetic neuropathy, unspecified: Secondary | ICD-10-CM | POA: Diagnosis not present

## 2016-11-26 DIAGNOSIS — R1084 Generalized abdominal pain: Secondary | ICD-10-CM | POA: Insufficient documentation

## 2016-11-26 DIAGNOSIS — R1030 Lower abdominal pain, unspecified: Secondary | ICD-10-CM | POA: Diagnosis not present

## 2016-11-26 LAB — COMPREHENSIVE METABOLIC PANEL
ALT: 12 U/L (ref 0–35)
AST: 18 U/L (ref 0–37)
Albumin: 3.9 g/dL (ref 3.5–5.2)
Alkaline Phosphatase: 74 U/L (ref 39–117)
BUN: 9 mg/dL (ref 6–23)
CO2: 28 mEq/L (ref 19–32)
Calcium: 9.8 mg/dL (ref 8.4–10.5)
Chloride: 104 mEq/L (ref 96–112)
Creatinine, Ser: 0.9 mg/dL (ref 0.40–1.20)
GFR: 79.53 mL/min (ref >60.00)
Glucose, Bld: 141 mg/dL — ABNORMAL HIGH (ref 70–99)
Potassium: 4.6 mEq/L (ref 3.5–5.1)
Sodium: 140 mEq/L (ref 135–145)
Total Bilirubin: 0.6 mg/dL (ref 0.2–1.2)
Total Protein: 6.9 g/dL (ref 6.0–8.3)

## 2016-11-26 LAB — POC URINALSYSI DIPSTICK (AUTOMATED)
Bilirubin, UA: NEGATIVE
Blood, UA: NEGATIVE
Glucose, UA: 500
Ketones, UA: NEGATIVE
Leukocytes, UA: NEGATIVE
Nitrite, UA: NEGATIVE
Protein, UA: NEGATIVE
Spec Grav, UA: 1.02 (ref 1.010–1.025)
Urobilinogen, UA: 0.2 E.U./dL
pH, UA: 6 (ref 5.0–8.0)

## 2016-11-26 LAB — CBC WITH DIFFERENTIAL/PLATELET
Basophils Absolute: 0.1 10*3/uL (ref 0.0–0.1)
Basophils Relative: 0.8 % (ref 0.0–3.0)
Eosinophils Absolute: 0.2 10*3/uL (ref 0.0–0.7)
Eosinophils Relative: 2.4 % (ref 0.0–5.0)
HCT: 41.7 % (ref 36.0–46.0)
Hemoglobin: 13.6 g/dL (ref 12.0–15.0)
Lymphocytes Relative: 33 % (ref 12.0–46.0)
Lymphs Abs: 2.5 10*3/uL (ref 0.7–4.0)
MCHC: 32.5 g/dL (ref 30.0–36.0)
MCV: 75.8 fl — ABNORMAL LOW (ref 78.0–100.0)
Monocytes Absolute: 0.6 10*3/uL (ref 0.1–1.0)
Monocytes Relative: 8 % (ref 3.0–12.0)
Neutro Abs: 4.2 10*3/uL (ref 1.4–7.7)
Neutrophils Relative %: 55.8 % (ref 43.0–77.0)
Platelets: 296 10*3/uL (ref 150.0–400.0)
RBC: 5.5 Mil/uL — ABNORMAL HIGH (ref 3.87–5.11)
RDW: 15.6 % — ABNORMAL HIGH (ref 11.5–15.5)
WBC: 7.6 10*3/uL (ref 4.0–10.5)

## 2016-11-26 MED ORDER — IOPAMIDOL (ISOVUE-300) INJECTION 61%
100.0000 mL | Freq: Once | INTRAVENOUS | Status: AC | PRN
  Administered 2016-11-26: 100 mL via INTRAVENOUS

## 2016-11-26 MED ORDER — TRAMADOL HCL 50 MG PO TABS: 50.0000 mg | ORAL_TABLET | Freq: Three times a day (TID) | ORAL | 0 refills | Status: DC | PRN

## 2016-11-26 NOTE — Patient Instructions (Signed)
Please stop at the lab to set up to have labs drawn. Please stop at the front desk to set up referral.  No food, clear liquids only until test. Tramadol for pain. We will call you with results.

## 2016-11-26 NOTE — Assessment & Plan Note (Signed)
Inadequate control.. No clear sign abd pain secondary to DM. Check CBG.

## 2016-11-26 NOTE — Assessment & Plan Note (Addendum)
Possibly due to back but atypical.. Need to first eval UA and rule out intraabdominal process with Abd pelvic CT. Eval with cbc and CMET as well. Canuse tramadol for pain.   UA clear except glucose.

## 2016-11-26 NOTE — Progress Notes (Signed)
Subjective:    Patient ID: [name], female    DOB: Sep 07, 1945, 71 y.o.   MRN: [id number]  HPI   71 year old female presents for follow up [facility] ER visit.   Seen on 11/21/2016 for low back pain radiating to L abd, L groin pain and radiation to left leg. No known falls or injury. Labs stable except Cr slightly elevated at 1.2 Large glucose in urine, Neg UA. X-ray of the lumbar spine reveal no evidence of fracture or subluxation in the area. There is evidence of mild degenerative change.   Started on tylenol with codeine ( does not hjelp with pain) and given flexeril. She reports pain is not any better.. Pain is now more suprapubic. No dysuria, no urinary urgency. No hematuria.  She does have constipation.. BM every other day, but soft.  No further back pain, no pain in groin.  No fever.  No N/V.  Cannot sleep due to pain  Pain worse with lifting legs, crossing legs.  Prior to 2011 had similar pain...   In 205 Abd ct showed calcification in low abd felt to be benign process. Hx of DM. FBS 174 S/P partial hysterectomy. Still has appendix.  Review of Systems  Constitutional: Negative for fatigue and fever.  HENT: Negative for ear pain.   Eyes: Negative for pain.  Respiratory: Negative for chest tightness and shortness of breath.   Cardiovascular: Negative for chest pain, palpitations and leg swelling.  Gastrointestinal: Positive for abdominal pain.  Genitourinary: Negative for dysuria.       Objective:   Physical Exam  Constitutional: Vital signs are normal. She appears well-developed and well-nourished. She is cooperative.  Non-toxic appearance. She does not appear ill. No distress.  Obese female in mild distress  HENT:  Head: Normocephalic.  Right Ear: Hearing, tympanic membrane, external ear and ear canal normal. Tympanic membrane is not erythematous, not retracted and not bulging.  Left Ear: Hearing, tympanic membrane, external ear and ear canal normal. Tympanic  membrane is not erythematous, not retracted and not bulging.  Nose: No mucosal edema or rhinorrhea. Right sinus exhibits no maxillary sinus tenderness and no frontal sinus tenderness. Left sinus exhibits no maxillary sinus tenderness and no frontal sinus tenderness.  Mouth/Throat: Uvula is midline, oropharynx is clear and moist and mucous membranes are normal.  Eyes: Conjunctivae, EOM and lids are normal. Pupils are equal, round, and reactive to light. Lids are everted and swept, no foreign bodies found.  Neck: Trachea normal and normal range of motion. Neck supple. Carotid bruit is not present. No thyroid mass and no thyromegaly present.  Cardiovascular: Normal rate, regular rhythm, S1 normal, S2 normal, normal heart sounds, intact distal pulses and normal pulses.  Exam reveals no gallop and no friction rub.   No murmur heard. Pulmonary/Chest: Effort normal and breath sounds normal. No tachypnea. No respiratory distress. She has no decreased breath sounds. She has no wheezes. She has no rhonchi. She has no rales.  Abdominal: Soft. Normal appearance and bowel sounds are normal. There is tenderness in the suprapubic area.  ttp over monspubis and suprapubic region. Increase in pain in low abd with walking and lifting legs to step up to table.  Musculoskeletal:       Lumbar back: She exhibits decreased range of motion and tenderness. She exhibits no bony tenderness.  Neg SLR, pain in central low abd with faber's  Neurological: She is alert.  Skin: Skin is warm, dry and intact. No rash noted.  Psychiatric: Her speech is normal and behavior is normal. Judgment and thought content normal. Her mood appears not anxious. Cognition and memory are normal. She does not exhibit a depressed mood.          Assessment & Plan:

## 2016-11-27 NOTE — Telephone Encounter (Signed)
Ms. Charters notified as instructed by telephone.  She would like to hold off on the prednisone for now.  Will await call from Delorise Royals with the PT appointment.

## 2016-11-27 NOTE — Telephone Encounter (Signed)
-----   Message from Carter Kitten, [place] sent at 11/27/2016 10:39 AM EDT ----- Ms. Hemingway notified as instructed by telephone.  She states she just did a prednisone taper about 2 weeks ago for hand pain.  She is asking if she should do another taper?  She is feeling a little better today.  Tramadol is helping.  She states the ER doctor mentioned something about maybe needing to do some PT.  Wants to know what Dr. Diona Browner feels about that.  She started taking Miralax last night.  I advised her to continue the Miralax for the next few days until she is having normal soft bowel movements.  Please advise.

## 2016-11-27 NOTE — Telephone Encounter (Signed)
If she wishes to hold off on prednisone we can, if pain is not continuing to improve have her let me know. It would be okayto repeat a prednisone course.  She can use tramadol for pain and I will make referral to PT.

## 2016-11-30 ENCOUNTER — Telehealth: Payer: Self-pay

## 2016-11-30 NOTE — Telephone Encounter (Signed)
Noted  

## 2016-11-30 NOTE — Telephone Encounter (Signed)
Pt has vaginal and perineal itching; pt has not taken abx; pt does not want to schedule at sat clinic; pt is going to try the nystatin cream that pt had been given in past for yeast infection and if that does not work pt will call early AM on Mon for appt. FYI to Dr Diona Browner.

## 2016-12-03 ENCOUNTER — Ambulatory Visit (INDEPENDENT_AMBULATORY_CARE_PROVIDER_SITE_OTHER): Payer: Medicare Other | Admitting: Family Medicine

## 2016-12-03 ENCOUNTER — Encounter: Payer: Self-pay | Admitting: Family Medicine

## 2016-12-03 VITALS — BP 126/76 | HR 106 | Temp 98.4°F | Ht 65.0 in | Wt 227.5 lb

## 2016-12-03 DIAGNOSIS — M5442 Lumbago with sciatica, left side: Secondary | ICD-10-CM | POA: Diagnosis not present

## 2016-12-03 DIAGNOSIS — G8929 Other chronic pain: Secondary | ICD-10-CM | POA: Insufficient documentation

## 2016-12-03 DIAGNOSIS — R102 Pelvic and perineal pain: Secondary | ICD-10-CM | POA: Diagnosis not present

## 2016-12-03 DIAGNOSIS — E0840 Diabetes mellitus due to underlying condition with diabetic neuropathy, unspecified: Secondary | ICD-10-CM | POA: Diagnosis not present

## 2016-12-03 DIAGNOSIS — M545 Low back pain, unspecified: Secondary | ICD-10-CM | POA: Insufficient documentation

## 2016-12-03 MED ORDER — PREDNISONE 20 MG PO TABS: ORAL_TABLET | ORAL | 0 refills | Status: DC

## 2016-12-03 NOTE — Assessment & Plan Note (Signed)
Follow blood sugars on prednisone.

## 2016-12-03 NOTE — Assessment & Plan Note (Signed)
Neg CT.Marland Kitchen Pain likely referred from back .

## 2016-12-03 NOTE — Progress Notes (Signed)
Subjective:    Patient ID: [name], female    DOB: 1946-04-25, 71 y.o.   MRN: [id number]  HPI    71 year old female presents with continued pain in suprapubic region.  Now more pain in low back, across low back. Radiating down  In left leg with movement. Pain is gradually worsening. No new numbness or weakness in legs. She is having accidents with urine given pain limits her mobility to get to the bathroom.  Trouble sleeping at night. Pain 9-10/10 at times.  Using tramadol 50 mg twice daily, flexeril  and  neurontin (on for neuropathy from DM) for pain.  tramaodl helps some with pain.  She is having regular BMs.  FBS not checking recently.  At last OV and recent ER visit: supra pubic pain and groin pain... CT abd neg, UA celar, CMET, cbc nml, lipase neg X-ray of the lumbar spine reveal no evidence of fracture or subluxation in the area. There is evidence of mild degenerative change.  No past back surgeries, procedures, ESI.  Blood pressure 126/76, pulse (!) 106, temperature 98.4 F (36.9 C), temperature source Oral, height 5\' 5"  (1.651 m), weight 227 lb 8 oz (103.2 kg).  Review of Systems  Constitutional: Negative for fatigue and fever.  HENT: Negative for ear pain.   Eyes: Negative for pain.  Respiratory: Negative for chest tightness and shortness of breath.   Cardiovascular: Negative for chest pain, palpitations and leg swelling.  Gastrointestinal: Negative for abdominal pain.  Genitourinary: Negative for dysuria.       Objective:   Physical Exam  Constitutional: She is oriented to person, place, and time. Vital signs are normal. She appears well-developed and well-nourished. She is cooperative.  Non-toxic appearance. She does not appear ill. No distress.  Obese using walker  HENT:  Head: Normocephalic.  Right Ear: Hearing, tympanic membrane, external ear and ear canal normal. Tympanic membrane is not erythematous, not retracted and not bulging.  Left Ear:  Hearing, tympanic membrane, external ear and ear canal normal. Tympanic membrane is not erythematous, not retracted and not bulging.  Nose: No mucosal edema or rhinorrhea. Right sinus exhibits no maxillary sinus tenderness and no frontal sinus tenderness. Left sinus exhibits no maxillary sinus tenderness and no frontal sinus tenderness.  Mouth/Throat: Uvula is midline, oropharynx is clear and moist and mucous membranes are normal.  Eyes: Conjunctivae, EOM and lids are normal. Pupils are equal, round, and reactive to light. Lids are everted and swept, no foreign bodies found.  Neck: Trachea normal and normal range of motion. Neck supple. Carotid bruit is not present. No thyroid mass and no thyromegaly present.  Cardiovascular: Normal rate, regular rhythm, S1 normal, S2 normal, normal heart sounds, intact distal pulses and normal pulses.  Exam reveals no gallop and no friction rub.   No murmur heard. Pulmonary/Chest: Effort normal and breath sounds normal. No tachypnea. No respiratory distress. She has no decreased breath sounds. She has no wheezes. She has no rhonchi. She has no rales.  Abdominal: Soft. Normal appearance and bowel sounds are normal. There is no tenderness.  Musculoskeletal:       Lumbar back: She exhibits decreased range of motion and tenderness. She exhibits no bony tenderness.  Positive seated SLR on left.  ttp over lubar spine centrally and bilateral over paraspinous muscles  Neurological: She is alert and oriented to person, place, and time. She has normal strength. She displays no atrophy. No cranial nerve deficit or sensory deficit. She exhibits  normal muscle tone. She displays a negative Romberg sign. Gait abnormal. Coordination normal.  Skin: Skin is warm, dry and intact. No rash noted.  Psychiatric: Her speech is normal and behavior is normal. Judgment and thought content normal. Her mood appears not anxious. Cognition and memory are normal. She does not exhibit a depressed  mood.          Assessment & Plan:

## 2016-12-03 NOTE — Assessment & Plan Note (Signed)
Treat with prednisone taper, increase frequency of tramadol to q 6 hours.  Start PT, heat.  Close follow up in 1-2 weeks.

## 2016-12-03 NOTE — Patient Instructions (Addendum)
Increase tramadol to 50 mg every 6 hours for pain.  Start prednisone taper  6 days.  Start heat on low back, keep PT appointment Call if pain not improving and tolerable.  Follow up in 1-2 weeks.

## 2016-12-04 DIAGNOSIS — M25552 Pain in left hip: Secondary | ICD-10-CM | POA: Diagnosis not present

## 2016-12-04 DIAGNOSIS — M545 Low back pain: Secondary | ICD-10-CM | POA: Diagnosis not present

## 2016-12-06 DIAGNOSIS — I10 Essential (primary) hypertension: Secondary | ICD-10-CM | POA: Diagnosis not present

## 2016-12-06 DIAGNOSIS — E118 Type 2 diabetes mellitus with unspecified complications: Secondary | ICD-10-CM | POA: Diagnosis not present

## 2016-12-06 DIAGNOSIS — G629 Polyneuropathy, unspecified: Secondary | ICD-10-CM | POA: Diagnosis not present

## 2016-12-11 ENCOUNTER — Ambulatory Visit: Payer: Medicare Other

## 2016-12-11 DIAGNOSIS — E1142 Type 2 diabetes mellitus with diabetic polyneuropathy: Secondary | ICD-10-CM | POA: Diagnosis not present

## 2016-12-11 DIAGNOSIS — J4540 Moderate persistent asthma, uncomplicated: Secondary | ICD-10-CM | POA: Diagnosis not present

## 2016-12-12 ENCOUNTER — Ambulatory Visit (INDEPENDENT_AMBULATORY_CARE_PROVIDER_SITE_OTHER): Payer: Medicare Other

## 2016-12-12 DIAGNOSIS — J4540 Moderate persistent asthma, uncomplicated: Secondary | ICD-10-CM

## 2016-12-13 ENCOUNTER — Other Ambulatory Visit: Payer: Self-pay

## 2016-12-13 ENCOUNTER — Encounter: Payer: Self-pay | Admitting: Family Medicine

## 2016-12-13 DIAGNOSIS — E119 Type 2 diabetes mellitus without complications: Secondary | ICD-10-CM | POA: Diagnosis not present

## 2016-12-13 DIAGNOSIS — H40013 Open angle with borderline findings, low risk, bilateral: Secondary | ICD-10-CM | POA: Diagnosis not present

## 2016-12-13 DIAGNOSIS — H2513 Age-related nuclear cataract, bilateral: Secondary | ICD-10-CM | POA: Diagnosis not present

## 2016-12-13 DIAGNOSIS — H47323 Drusen of optic disc, bilateral: Secondary | ICD-10-CM | POA: Diagnosis not present

## 2016-12-13 LAB — HM DIABETES EYE EXAM

## 2016-12-13 MED ORDER — TRAMADOL HCL 50 MG PO TABS: 50.0000 mg | ORAL_TABLET | Freq: Three times a day (TID) | ORAL | 0 refills | Status: DC | PRN

## 2016-12-13 NOTE — Telephone Encounter (Signed)
Tramadol called into [facility] - [place], [place] - [address] Phone: [phone] 

## 2016-12-13 NOTE — Telephone Encounter (Signed)
Pt left v/m requesting status of tramadol refill; I left v/m for pt to ck with [facility].

## 2016-12-13 NOTE — Telephone Encounter (Signed)
Pt left note requesting refill tramadol to [facility]. Last refilled # 30 on 11/26/16; pt last seen 12/03/16.

## 2016-12-14 DIAGNOSIS — M25552 Pain in left hip: Secondary | ICD-10-CM | POA: Diagnosis not present

## 2016-12-14 DIAGNOSIS — M545 Low back pain: Secondary | ICD-10-CM | POA: Diagnosis not present

## 2016-12-17 ENCOUNTER — Encounter: Payer: Self-pay | Admitting: Family Medicine

## 2016-12-18 ENCOUNTER — Telehealth: Payer: Self-pay | Admitting: Cardiovascular Disease

## 2016-12-18 DIAGNOSIS — M545 Low back pain: Secondary | ICD-10-CM | POA: Diagnosis not present

## 2016-12-18 DIAGNOSIS — M25552 Pain in left hip: Secondary | ICD-10-CM | POA: Diagnosis not present

## 2016-12-18 NOTE — Telephone Encounter (Signed)
Spoke with pt, she only has 2 pills left. Aware we can only give her 7 tablets. Will forward to dr croitoru to see if she can switch to eliquis, as the patient assistance program is easier to get approved.

## 2016-12-18 NOTE — Telephone Encounter (Signed)
New Message      *STAT* If patient is at the pharmacy, call can be transferred to refill team.   1. Which medications need to be refilled? (please list name of each medication and dose if known) Xarelto   2. Which pharmacy/location (including street and city if local pharmacy) is medication to be sent to? [facility] [address] [place]   3. Do they need a 30 day or 90 day supply?  [place]

## 2016-12-18 NOTE — Telephone Encounter (Signed)
Left message for patient to call to discuss if she wants to change to eliquis or refill the xarelto.

## 2016-12-18 NOTE — Telephone Encounter (Signed)
New message   Patient calling the office for samples of medication:   1.  What medication and dosage are you requesting samples for? XARELTO 20 MG TABS tablet  2.  Are you currently out of this medication? Almost out and now it is over $100 and she cannot afford to pay this. States she needs this medication.

## 2016-12-18 NOTE — Telephone Encounter (Signed)
Yes, Eliquis is just as good

## 2016-12-19 MED ORDER — RIVAROXABAN 20 MG PO TABS: 20.0000 mg | ORAL_TABLET | Freq: Every day | ORAL | 5 refills | Status: DC

## 2016-12-19 NOTE — Telephone Encounter (Signed)
Returned call to patient. She wishes to remain on xarelto and not change to eliquis. Rx(s) sent to pharmacy electronically.

## 2016-12-19 NOTE — Telephone Encounter (Signed)
New message ° ° ° °Pt is returning call to RN. °

## 2016-12-21 DIAGNOSIS — M545 Low back pain: Secondary | ICD-10-CM | POA: Diagnosis not present

## 2016-12-21 DIAGNOSIS — M25552 Pain in left hip: Secondary | ICD-10-CM | POA: Diagnosis not present

## 2016-12-23 ENCOUNTER — Other Ambulatory Visit: Payer: Self-pay | Admitting: Allergy and Immunology

## 2016-12-26 ENCOUNTER — Telehealth: Payer: Self-pay | Admitting: Cardiovascular Disease

## 2016-12-26 NOTE — Telephone Encounter (Signed)
New message   Pt states that she is calling regarding her schedule that she had discussed with Dr. Lurline Del nurse originally. Requests a call back.

## 2016-12-26 NOTE — Telephone Encounter (Signed)
Returned the call back to the patient. She stated that she would like to know when she needed to return for a follow up. Per the last office notes, she should return in 12 months. The patient verbalized her understanding and stated that she would call back in September.

## 2016-12-28 ENCOUNTER — Ambulatory Visit (INDEPENDENT_AMBULATORY_CARE_PROVIDER_SITE_OTHER): Payer: Medicare Other | Admitting: Family Medicine

## 2016-12-28 ENCOUNTER — Encounter: Payer: Self-pay | Admitting: Family Medicine

## 2016-12-28 VITALS — BP 110/66 | HR 50 | Temp 97.7°F | Resp 16 | Ht 65.0 in | Wt 228.2 lb

## 2016-12-28 DIAGNOSIS — M5442 Lumbago with sciatica, left side: Secondary | ICD-10-CM

## 2016-12-28 MED ORDER — DEXAMETHASONE SODIUM PHOSPHATE 10 MG/ML IJ SOLN
10.0000 mg | Freq: Once | INTRAMUSCULAR | Status: AC
  Administered 2016-12-28: 10 mg via INTRAMUSCULAR

## 2016-12-28 MED ORDER — TRAMADOL HCL 50 MG PO TABS: 50.0000 mg | ORAL_TABLET | Freq: Three times a day (TID) | ORAL | 0 refills | Status: DC | PRN

## 2016-12-28 NOTE — Patient Instructions (Addendum)
I think you have left sided sciatica.  Treat with shot of dexamethasone and tramadol refilled. Continue working with physical therapy, do exercises provided today.  If no improvement, return to see [place].

## 2016-12-28 NOTE — Assessment & Plan Note (Signed)
She was doing well last few weeks, but 1d h/o sudden recurrence of L LBP with L sciatica. Anticipate sciatica from HNP or piriformis syndrome.  No red flags. rec treat with dexamethasone IM 10mg  (pt states has tolerated well, discussed hyperglycemia precautions) and refill of tramadol.  Continue PT. Update [place] if no improvement for further evaluation, if ongoing would consider advanced imaging.

## 2016-12-28 NOTE — Progress Notes (Signed)
BP 110/66   Pulse (!) 50   Temp 97.7 F (36.5 C) (Oral)   Resp 16   Ht 5\' 5"  (1.651 m)   Wt 228 lb 4 oz (103.5 kg)   SpO2 98%   BMI 37.98 kg/m    CC: back pain Subjective:    Patient ID: [name], female    DOB: 1945-10-23, 71 y.o.   MRN: [id number]  HPI: [name] is a 71 y.o. female presenting on 12/28/2016 for Back Pain (left LBP, sharp, started yesterday ) and Diarrhea   Yesterday had acute flare of lower back pain without radiation down leg. Denies inciting trauma/injury. No shooting pain or numbness/weakness down leg. No fevers/chills, bowel/bladder incontinence. Noticing increasing urinary urgency.   Ongoing lower back pain for the past 1+ month. Seen recently at ER and by PCP for acute back pain with L sciatica, treated with prednisone course and increased tramadol. She was also referred to physical therapy. Xray showed mild degenerative changes.   She has been going to PT, which has helped.  Ran out of tramadol - tries to only use sparingly.  She has been using walker and cane.   DM hx.  Lab Results  Component Value Date   HGBA1C 6.2 08/14/2016    Has been using miralax for constipation. Yesterday had episode of diarrhea.   Relevant past medical, surgical, family and social history reviewed and updated as indicated. Interim medical history since our last visit reviewed. Allergies and medications reviewed and updated. Outpatient Medications Prior to Visit  Medication Sig Dispense Refill  . ADVAIR HFA 230-21 MCG/ACT inhaler INHALE 2 PUFFS BY MOUTH TWICE DAILY 12 g 4  . albuterol (PROVENTIL HFA) 108 (90 BASE) MCG/ACT inhaler Inhale 2 puffs into the lungs every 6 (six) hours as needed for wheezing or shortness of breath. 1 Inhaler 0  . azelastine (ASTELIN) 0.1 % nasal spray USE 1-2 SPRAYS IN EACH NOSTRIL TWICE DAILY AS NEEDED 30 mL 5  . Biotin 1 MG CAPS Take 1 mg by mouth as needed (muscles).     . cyclobenzaprine (FLEXERIL) 10 MG tablet Take 1 tablet (10  mg total) by mouth 2 (two) times daily as needed for muscle spasms. 15 tablet 0  . diltiazem (CARTIA XT) 120 MG 24 hr capsule Take 1 capsule (120 mg total) by mouth daily. KEEP OV. 30 capsule 2  . DULoxetine (CYMBALTA) 30 MG capsule Take 30 mg by mouth daily.    . Empagliflozin-Linagliptin (GLYXAMBI) 10-5 MG TABS Take 1 tablet by mouth daily.    Marland Kitchen EPINEPHRINE 0.3 mg/0.3 mL IJ SOAJ injection USE AS DIRECTED FOR LIFE THREATENING ALLERGIC REACTIONS 2 Device 2  . fluticasone (FLOVENT HFA) 220 MCG/ACT inhaler Inhale 2 puffs into the lungs 2 (two) times daily as needed. 1 Inhaler 5  . furosemide (LASIX) 40 MG tablet Take 1 tablet (40 mg total) by mouth daily. 30 tablet 2  . gabapentin (NEURONTIN) 300 MG capsule Take 600 mg by mouth 3 (three) times daily.  6  . Lactase (DAIRY-RELIEF PO) Take 1 capsule by mouth daily as needed (stomach upset).     Marland Kitchen levalbuterol (XOPENEX) 1.25 MG/3ML nebulizer solution Take 1.25 mg by nebulization every 4 (four) hours as needed for wheezing. 150 mL 0  . Magnesium 250 MG TABS Take 1 tablet by mouth daily.    . montelukast (SINGULAIR) 10 MG tablet TAKE 1 TABLET BY MOUTH EVERY DAY 30 tablet 5  . nystatin cream (MYCOSTATIN) Apply 1 application  topically 2 (two) times daily. Continue 48 hours after rash resolved. 30 g 1  . Olopatadine HCl 0.2 % SOLN INSTILL 1 DROP INTO BOTH EYES ONCE DAILY FOR ITCHY EYES AS NEEDED 7.5 mL 1  . Omega 3-6-9 Fatty Acids (OMEGA 3-6-9 COMPLEX PO) Take 2 capsules by mouth daily. Reported on 08/02/2015    . ONETOUCH DELICA LANCETS 69G MISC as needed (blood check).     [name] VERIO test strip 1 each by Other route as needed (blood monitoring).     . pantoprazole (PROTONIX) 40 MG tablet TAKE 1 TABLET BY MOUTH EVERY DAY 90 tablet 0  . ranitidine (ZANTAC) 300 MG tablet TAKE 1 TABLET BY MOUTH EVERY NIGHT AT BEDTIME 90 tablet 1  . rivaroxaban (XARELTO) 20 MG TABS tablet Take 1 tablet (20 mg total) by mouth daily with supper. 30 tablet 5  . SPIRIVA  RESPIMAT 1.25 MCG/ACT AERS INHALE 2 PUFFS BY MOUTH EVERY DAY 4 g 5  . SUMAtriptan (IMITREX) 100 MG tablet Take 100 mg x 1 may, repeat in 2 hour for migraine. 10 tablet 0  . valsartan (DIOVAN) 160 MG tablet TAKE 1 TABLET BY MOUTH EVERY DAY 30 tablet 8  . predniSONE (DELTASONE) 20 MG tablet 3 tabs by mouth daily x 3 days, then 2 tabs by mouth daily x 2 days then 1 tab by mouth daily x 2 days 15 tablet 0  . traMADol (ULTRAM) 50 MG tablet Take 1 tablet (50 mg total) by mouth every 8 (eight) hours as needed. 30 tablet 0   Facility-Administered Medications Prior to Visit  Medication Dose Route Frequency Provider Last Rate Last Dose  . omalizumab Arvid Right) injection 300 mg  300 mg Subcutaneous Q28 days [name], MD   300 mg at 12/12/16 2952     Per HPI unless specifically indicated in ROS section below Review of Systems     Objective:    BP 110/66   Pulse (!) 50   Temp 97.7 F (36.5 C) (Oral)   Resp 16   Ht 5\' 5"  (1.651 m)   Wt 228 lb 4 oz (103.5 kg)   SpO2 98%   BMI 37.98 kg/m   Wt Readings from Last 3 Encounters:  12/28/16 228 lb 4 oz (103.5 kg)  12/03/16 227 lb 8 oz (103.2 kg)  11/26/16 233 lb (105.7 kg)    Physical Exam  Constitutional: She appears well-developed and well-nourished. No distress.  Walks with cane today  Musculoskeletal: She exhibits no edema.  ++ tender midline spine lower thoracic region through lumbar spine + L paraspinous mm tenderness + SLR on left No pain with int/ext rotation at hip. Neg FABER. ++ tender at L sciatic notch.  Neurological:  5/5 strength BLE  Skin: Skin is warm and dry. No rash noted.  Nursing note and vitals reviewed.     Assessment & Plan:   Problem List Items Addressed This Visit    Acute low back pain with left-sided sciatica - Primary    She was doing well last few weeks, but 1d h/o sudden recurrence of L LBP with L sciatica. Anticipate sciatica from HNP or piriformis syndrome.  No red flags. rec treat with  dexamethasone IM 10mg  (pt states has tolerated well, discussed hyperglycemia precautions) and refill of tramadol.  Continue PT. Update [place] if no improvement for further evaluation, if ongoing would consider advanced imaging.       Relevant Medications   traMADol (ULTRAM) 50 MG tablet  Follow up plan: Return if symptoms worsen or fail to improve.  Ria Bush, MD

## 2016-12-28 NOTE — Addendum Note (Signed)
Addended by: Valere Dross on: 12/28/2016 04:40 PM   Modules accepted: Orders

## 2017-01-04 ENCOUNTER — Ambulatory Visit (INDEPENDENT_AMBULATORY_CARE_PROVIDER_SITE_OTHER): Payer: Medicare Other | Admitting: Family Medicine

## 2017-01-04 ENCOUNTER — Encounter: Payer: Self-pay | Admitting: Family Medicine

## 2017-01-04 VITALS — BP 120/80 | HR 70 | Temp 97.7°F | Ht 65.0 in | Wt 220.0 lb

## 2017-01-04 DIAGNOSIS — R109 Unspecified abdominal pain: Secondary | ICD-10-CM

## 2017-01-04 DIAGNOSIS — R197 Diarrhea, unspecified: Secondary | ICD-10-CM | POA: Insufficient documentation

## 2017-01-04 DIAGNOSIS — M5442 Lumbago with sciatica, left side: Secondary | ICD-10-CM

## 2017-01-04 LAB — CBC WITH DIFFERENTIAL/PLATELET
Basophils Absolute: 0.1 10*3/uL (ref 0.0–0.1)
Basophils Relative: 0.9 % (ref 0.0–3.0)
Eosinophils Absolute: 0.1 10*3/uL (ref 0.0–0.7)
Eosinophils Relative: 1 % (ref 0.0–5.0)
HCT: 45.5 % (ref 36.0–46.0)
Hemoglobin: 15.2 g/dL — ABNORMAL HIGH (ref 12.0–15.0)
Lymphocytes Relative: 30.8 % (ref 12.0–46.0)
Lymphs Abs: 1.8 10*3/uL (ref 0.7–4.0)
MCHC: 33.4 g/dL (ref 30.0–36.0)
MCV: 73.7 fl — ABNORMAL LOW (ref 78.0–100.0)
Monocytes Absolute: 1.1 10*3/uL — ABNORMAL HIGH (ref 0.1–1.0)
Monocytes Relative: 18.8 % — ABNORMAL HIGH (ref 3.0–12.0)
Neutro Abs: 2.9 10*3/uL (ref 1.4–7.7)
Neutrophils Relative %: 48.5 % (ref 43.0–77.0)
Platelets: 242 10*3/uL (ref 150.0–400.0)
RBC: 6.17 Mil/uL — ABNORMAL HIGH (ref 3.87–5.11)
RDW: 15.3 % (ref 11.5–15.5)
WBC: 6 10*3/uL (ref 4.0–10.5)

## 2017-01-04 LAB — COMPREHENSIVE METABOLIC PANEL
ALT: 23 U/L (ref 0–35)
AST: 35 U/L (ref 0–37)
Albumin: 3.8 g/dL (ref 3.5–5.2)
Alkaline Phosphatase: 76 U/L (ref 39–117)
BUN: 11 mg/dL (ref 6–23)
CO2: 23 mEq/L (ref 19–32)
Calcium: 9.5 mg/dL (ref 8.4–10.5)
Chloride: 99 mEq/L (ref 96–112)
Creatinine, Ser: 1.08 mg/dL (ref 0.40–1.20)
GFR: 64.42 mL/min (ref >60.00)
Glucose, Bld: 219 mg/dL — ABNORMAL HIGH (ref 70–99)
Potassium: 4.2 mEq/L (ref 3.5–5.1)
Sodium: 134 mEq/L — ABNORMAL LOW (ref 135–145)
Total Bilirubin: 0.8 mg/dL (ref 0.2–1.2)
Total Protein: 6.7 g/dL (ref 6.0–8.3)

## 2017-01-04 NOTE — Patient Instructions (Signed)
Labs today. Stool test today. Bland diet over weekend - bananas, rice, appesauce, toast, and chicken broth, jello. We will be in touch with results.

## 2017-01-04 NOTE — Assessment & Plan Note (Signed)
Actually significant improvement after treatment last week. She continues PT which is helpful.

## 2017-01-04 NOTE — Assessment & Plan Note (Addendum)
Of 5d duration, not improving. Check labs today, stool pathogen panel.  Encouraged increased water intake.

## 2017-01-04 NOTE — Progress Notes (Addendum)
BP 120/80   Pulse 70   Temp 97.7 F (36.5 C)   Ht 5\' 5"  (1.651 m)   Wt 220 lb (99.8 kg)   SpO2 99%   BMI 36.61 kg/m    CC: diarrhea  Subjective:    Patient ID: [name], female    DOB: 02-Nov-1945, 71 y.o.   MRN: [id number]  HPI: [name] is a 71 y.o. female presenting on 01/04/2017 for Back Pain and Diarrhea   See prior note for details.  Seen here last week with acute flare of L sided sciatica - treated with dexamethasone 10mg  IM and tramadol. She also has been seeing physical therapy. Back is actually significantly improved.   Presents today with watery diarrhea over the past week, 3 times a day, no appetite, nausea, marked weakness/fatigue and some new dyspnea with ambulation. Mild cough present. She had 1 vomiting episode 2 days ago, NBNB. Some left sided discomfort.  No fevers/chills, blood in stool. No stool accidents. No chest pain, leg swelling. No dysuria, urgency, frequency.  No sick contacts at home.  No recent travel or new foods.  No recent antibiotics.  Has stopped miralax.  Has missed physical therapy this past week due to the heat.  8 lb weight loss due to no appetite.  S/p partial hysterectomy, ovaries remain.   She did have similar symptoms 1 month ago s/p unrevealing evaluation at ER and with PCP, including labs and CT scan - no diverticulitis, + constipation. Lumbar and sacral degenerative changes. Urinalysis only showed glucosuria.   Colonoscopy 07/2012 - WNL (bucchini)  Relevant past medical, surgical, family and social history reviewed and updated as indicated. Interim medical history since our last visit reviewed. Allergies and medications reviewed and updated. Outpatient Medications Prior to Visit  Medication Sig Dispense Refill  . ADVAIR HFA 230-21 MCG/ACT inhaler INHALE 2 PUFFS BY MOUTH TWICE DAILY 12 g 4  . albuterol (PROVENTIL HFA) 108 (90 BASE) MCG/ACT inhaler Inhale 2 puffs into the lungs every 6 (six) hours as needed for wheezing  or shortness of breath. 1 Inhaler 0  . azelastine (ASTELIN) 0.1 % nasal spray USE 1-2 SPRAYS IN EACH NOSTRIL TWICE DAILY AS NEEDED 30 mL 5  . Biotin 1 MG CAPS Take 1 mg by mouth as needed (muscles).     . cyclobenzaprine (FLEXERIL) 10 MG tablet Take 1 tablet (10 mg total) by mouth 2 (two) times daily as needed for muscle spasms. 15 tablet 0  . diltiazem (CARTIA XT) 120 MG 24 hr capsule Take 1 capsule (120 mg total) by mouth daily. KEEP OV. 30 capsule 2  . DULoxetine (CYMBALTA) 30 MG capsule Take 30 mg by mouth daily.    . Empagliflozin-Linagliptin (GLYXAMBI) 10-5 MG TABS Take 1 tablet by mouth daily.    Marland Kitchen EPINEPHRINE 0.3 mg/0.3 mL IJ SOAJ injection USE AS DIRECTED FOR LIFE THREATENING ALLERGIC REACTIONS 2 Device 2  . fluticasone (FLOVENT HFA) 220 MCG/ACT inhaler Inhale 2 puffs into the lungs 2 (two) times daily as needed. 1 Inhaler 5  . furosemide (LASIX) 40 MG tablet Take 1 tablet (40 mg total) by mouth daily. 30 tablet 2  . gabapentin (NEURONTIN) 300 MG capsule Take 600 mg by mouth 3 (three) times daily.  6  . Lactase (DAIRY-RELIEF PO) Take 1 capsule by mouth daily as needed (stomach upset).     Marland Kitchen levalbuterol (XOPENEX) 1.25 MG/3ML nebulizer solution Take 1.25 mg by nebulization every 4 (four) hours as needed for wheezing. 150 mL  0  . Magnesium 250 MG TABS Take 1 tablet by mouth daily.    . montelukast (SINGULAIR) 10 MG tablet TAKE 1 TABLET BY MOUTH EVERY DAY 30 tablet 5  . nystatin cream (MYCOSTATIN) Apply 1 application topically 2 (two) times daily. Continue 48 hours after rash resolved. 30 g 1  . Olopatadine HCl 0.2 % SOLN INSTILL 1 DROP INTO BOTH EYES ONCE DAILY FOR ITCHY EYES AS NEEDED 7.5 mL 1  . Omega 3-6-9 Fatty Acids (OMEGA 3-6-9 COMPLEX PO) Take 2 capsules by mouth daily. Reported on 08/02/2015    . ONETOUCH DELICA LANCETS 29B MISC as needed (blood check).     [name] VERIO test strip 1 each by Other route as needed (blood monitoring).     . pantoprazole (PROTONIX) 40 MG tablet TAKE  1 TABLET BY MOUTH EVERY DAY 90 tablet 0  . ranitidine (ZANTAC) 300 MG tablet TAKE 1 TABLET BY MOUTH EVERY NIGHT AT BEDTIME 90 tablet 1  . rivaroxaban (XARELTO) 20 MG TABS tablet Take 1 tablet (20 mg total) by mouth daily with supper. 30 tablet 5  . SPIRIVA RESPIMAT 1.25 MCG/ACT AERS INHALE 2 PUFFS BY MOUTH EVERY DAY 4 g 5  . SUMAtriptan (IMITREX) 100 MG tablet Take 100 mg x 1 may, repeat in 2 hour for migraine. 10 tablet 0  . traMADol (ULTRAM) 50 MG tablet Take 1 tablet (50 mg total) by mouth every 8 (eight) hours as needed. 30 tablet 0  . valsartan (DIOVAN) 160 MG tablet TAKE 1 TABLET BY MOUTH EVERY DAY 30 tablet 8   Facility-Administered Medications Prior to Visit  Medication Dose Route Frequency Provider Last Rate Last Dose  . omalizumab Arvid Right) injection 300 mg  300 mg Subcutaneous Q28 days [name], MD   300 mg at 12/12/16 2841     Per HPI unless specifically indicated in ROS section below Review of Systems     Objective:    BP 120/80   Pulse 70   Temp 97.7 F (36.5 C)   Ht 5\' 5"  (1.651 m)   Wt 220 lb (99.8 kg)   SpO2 99%   BMI 36.61 kg/m   Wt Readings from Last 3 Encounters:  01/04/17 220 lb (99.8 kg)  12/28/16 228 lb 4 oz (103.5 kg)  12/03/16 227 lb 8 oz (103.2 kg)    Physical Exam  Constitutional: She appears well-developed and well-nourished. No distress.  HENT:  Mouth/Throat: Oropharynx is clear and moist. No oropharyngeal exudate.  Cardiovascular: Normal rate, regular rhythm, normal heart sounds and intact distal pulses.   No murmur heard. Pulmonary/Chest: Effort normal and breath sounds normal. No respiratory distress. She has no wheezes. She has no rales.  Abdominal: Soft. Normal appearance and bowel sounds are normal. She exhibits no distension and no mass. There is no hepatosplenomegaly. There is tenderness (moderate) in the left upper quadrant and left lower quadrant. There is no rigidity, no rebound, no guarding, no CVA tenderness and negative Murphy's  sign.  Musculoskeletal: She exhibits no edema.  Skin: Skin is warm and dry. No rash noted.  Nursing note and vitals reviewed.  Results for orders placed or performed in visit on 12/13/16  HM DIABETES EYE EXAM  Result Value Ref Range   HM Diabetic Eye Exam No Retinopathy No Retinopathy   CT ABDOMEN AND PELVIS WITH CONTRAST TECHNIQUE: Multidetector CT imaging of the abdomen and pelvis was performed using the standard protocol following bolus administration of intravenous contrast. CONTRAST:  173mL ISOVUE-300 IOPAMIDOL (ISOVUE-300)  INJECTION 61% COMPARISON:  05/19/2014.  01/31/2012.  06/25/2003. FINDINGS: Lower chest: Normal Hepatobiliary: Normal appearance of the liver. No calcified gallstones. Pancreas: Normal Spleen: Normal Adrenals/Urinary Tract: Adrenal glands are normal. Kidneys are normal except for 1 cm cyst at the lower pole on the right. Stomach/Bowel: Fairly large amount of fecal matter throughout the colon. No evidence of diverticulosis or diverticulitis. No other bowel finding of note. Vascular/Lymphatic: Aorta is normal. No visible atherosclerotic change. IVC is normal. No abdominal retroperitoneal lymphadenopathy. There is a calcified structure along the right pelvic sidewall that could be chronic calcified iliac nodes. No significant change since 2015. Reproductive: Previous hysterectomy.  No pelvic mass. Other: No free fluid or air. Musculoskeletal: Chronic lower lumbar degenerative changes. Bilateral sacroiliac arthritis. IMPRESSION: No cause of the presenting symptoms is identified. No evidence of diverticulitis or other acute bowel pathology. The patient does have a moderate amount of fecal matter throughout the colon. Chronic calcified right iliac chain nodes, unchanged since 2013 and not of concern. Lumbar and sacroiliac degenerative changes. These results will be called to the ordering clinician or representative by the Radiologist Assistant, and  communication documented in the PACS or zVision Dashboard. Electronically Signed   By: Nelson Chimes M.D.   On: 11/26/2016 15:51    Assessment & Plan:   Problem List Items Addressed This Visit    Acute low back pain with left-sided sciatica    Actually significant improvement after treatment last week. She continues PT which is helpful.       Diarrhea - Primary    Of 5d duration, not improving. Check labs today, stool pathogen panel.  Encouraged increased water intake.       Relevant Orders   Comprehensive metabolic panel   CBC with Differential/Platelet   Gastrointestinal Pathogen Panel PCR   Left sided abdominal pain    Initial concern for diverticulitis however reviewing chart, she did have similar episode last month (not associated with diarrhea) with unrevealing contrasted CT abd/pelvis and labwork/urinalysis. Unclear etiology at this time. Will start by updating labs (CBC, CMP). If leukocytosis, low threshold to treat as diverticulitis. No UA as no urinary symptoms.       Relevant Orders   Comprehensive metabolic panel   CBC with Differential/Platelet   Gastrointestinal Pathogen Panel PCR       Follow up plan: Return if symptoms worsen or fail to improve.  Ria Bush, MD

## 2017-01-04 NOTE — Assessment & Plan Note (Signed)
Initial concern for diverticulitis however reviewing chart, she did have similar episode last month (not associated with diarrhea) with unrevealing contrasted CT abd/pelvis and labwork/urinalysis. Unclear etiology at this time. Will start by updating labs (CBC, CMP). If leukocytosis, low threshold to treat as diverticulitis. No UA as no urinary symptoms.

## 2017-01-04 NOTE — Progress Notes (Signed)
Pre visit review using our clinic review tool, if applicable. No additional management support is needed unless otherwise documented below in the visit note. 

## 2017-01-07 ENCOUNTER — Telehealth: Payer: Self-pay | Admitting: Family Medicine

## 2017-01-07 DIAGNOSIS — J4540 Moderate persistent asthma, uncomplicated: Secondary | ICD-10-CM | POA: Diagnosis not present

## 2017-01-07 LAB — GASTROINTESTINAL PATHOGEN PANEL PCR
C. difficile Tox A/B, PCR: NOT DETECTED
Campylobacter, PCR: NOT DETECTED
Cryptosporidium, PCR: NOT DETECTED
E coli (ETEC) LT/ST PCR: NOT DETECTED
E coli (STEC) stx1/stx2, PCR: NOT DETECTED
E coli 0157, PCR: NOT DETECTED
Giardia lamblia, PCR: NOT DETECTED
Norovirus, PCR: NOT DETECTED
Rotavirus A, PCR: NOT DETECTED
Salmonella, PCR: NOT DETECTED
Shigella, PCR: NOT DETECTED

## 2017-01-07 NOTE — Telephone Encounter (Signed)
Pt returned call. She said it was Dr. Bosie Clos assistant. She said she would be home the rest of the day and is requesting a cb.

## 2017-01-08 ENCOUNTER — Ambulatory Visit (INDEPENDENT_AMBULATORY_CARE_PROVIDER_SITE_OTHER): Payer: Medicare Other | Admitting: *Deleted

## 2017-01-08 DIAGNOSIS — J4540 Moderate persistent asthma, uncomplicated: Secondary | ICD-10-CM | POA: Diagnosis not present

## 2017-01-08 NOTE — Telephone Encounter (Signed)
Pt returned call about labs.  Please call back

## 2017-01-11 ENCOUNTER — Encounter: Payer: Self-pay | Admitting: Family Medicine

## 2017-01-11 ENCOUNTER — Ambulatory Visit (INDEPENDENT_AMBULATORY_CARE_PROVIDER_SITE_OTHER): Payer: Medicare Other | Admitting: Family Medicine

## 2017-01-11 DIAGNOSIS — R1084 Generalized abdominal pain: Secondary | ICD-10-CM

## 2017-01-11 DIAGNOSIS — D72821 Monocytosis (symptomatic): Secondary | ICD-10-CM

## 2017-01-11 NOTE — Assessment & Plan Note (Addendum)
No clear infectious source.  trial of probiotics.  Attempt to move Gi appt sooner for further evaluation.

## 2017-01-11 NOTE — Progress Notes (Signed)
Subjective:    Patient ID: [name], female    DOB: [birth year], 71 y.o.   MRN: [id number]  HPI   71 year old female pt with history of   Recently saw Dr. Darnell Level  01/04/2017 for diarrhea:watery diarrhea over the past week, 3 times a day, no appetite, nausea, marked weakness/fatigue and some new dyspnea with ambulation. Mild cough present. She had 1 vomiting episode 2 days ago, NBNB. Some left sided discomfort.  No fevers/chills, blood in stool. No stool accidents. No chest pain, leg swelling. No dysuria, urgency, frequency.  No sick contacts at home.  No recent travel or new foods.  No recent antibiotics.  Has stopped miralax.  Has missed physical therapy this past week due to the heat.  8 lb weight loss due to no appetite.  S/p partial hysterectomy, ovaries remain.   She did have similar symptoms 1 month ago s/p unrevealing evaluation at ER and with PCP, including labs and CT scan - no diverticulitis, + constipation. Lumbar and sacral degenerative changes. Urinalysis only showed glucosuria.   Colonoscopy 07/2012 - WNL (bucchini)  Dr. Darnell Level obtained labs and stool culture:  negative  Sugar was high at 219  new monocytosis 18   Today she returns to clinic  For follow up.  BM this AM first in the last 3 days.. Soft, no blood.  She has had some improvement in abd pain, generalized. Still with decreased appetite, keeping up with liquids 54 oz water daily.  No fever. Several month She has been feeling really tired in last month.   She has follow up with Dr Cristina Gong 7/23  Review of Systems  Constitutional: Positive for fatigue. Negative for fever.  HENT: Negative for ear pain.   Eyes: Negative for pain.  Respiratory: Negative for chest tightness and shortness of breath.   Cardiovascular: Negative for chest pain, palpitations and leg swelling.  Gastrointestinal: Positive for abdominal pain and diarrhea.  Genitourinary: Negative for dysuria.       Objective:   Physical Exam    Constitutional: Vital signs are normal. She appears well-developed and well-nourished. She is cooperative.  Non-toxic appearance. She does not appear ill. No distress.  obese  HENT:  Head: Normocephalic.  Right Ear: Hearing, tympanic membrane, external ear and ear canal normal. Tympanic membrane is not erythematous, not retracted and not bulging.  Left Ear: Hearing, tympanic membrane, external ear and ear canal normal. Tympanic membrane is not erythematous, not retracted and not bulging.  Nose: No mucosal edema or rhinorrhea. Right sinus exhibits no maxillary sinus tenderness and no frontal sinus tenderness. Left sinus exhibits no maxillary sinus tenderness and no frontal sinus tenderness.  Mouth/Throat: Uvula is midline, oropharynx is clear and moist and mucous membranes are normal.  Eyes: Pupils are equal, round, and reactive to light. Conjunctivae, EOM and lids are normal. Lids are everted and swept, no foreign bodies found.  Neck: Trachea normal and normal range of motion. Neck supple. Carotid bruit is not present. No thyroid mass and no thyromegaly present.  Cardiovascular: Normal rate, regular rhythm, S1 normal, S2 normal, normal heart sounds, intact distal pulses and normal pulses.  Exam reveals no gallop and no friction rub.   No murmur heard. Pulmonary/Chest: Effort normal and breath sounds normal. No tachypnea. No respiratory distress. She has no decreased breath sounds. She has no wheezes. She has no rhonchi. She has no rales.  Abdominal: Soft. Normal appearance and bowel sounds are normal. There is no tenderness.  Neurological: She  is alert.  Skin: Skin is warm, dry and intact. No rash noted.  Psychiatric: Her speech is normal and behavior is normal. Judgment and thought content normal. Her mood appears not anxious. Cognition and memory are normal. She does not exhibit a depressed mood.          Assessment & Plan:

## 2017-01-11 NOTE — Patient Instructions (Signed)
Start probiotic like Align or culturelle ( lactobaccli) daily.  Please stop at the front desk to set up referral to GI sooner

## 2017-01-13 ENCOUNTER — Other Ambulatory Visit: Payer: Self-pay | Admitting: Allergy and Immunology

## 2017-01-21 DIAGNOSIS — R197 Diarrhea, unspecified: Secondary | ICD-10-CM | POA: Diagnosis not present

## 2017-01-21 DIAGNOSIS — R194 Change in bowel habit: Secondary | ICD-10-CM | POA: Diagnosis not present

## 2017-01-21 DIAGNOSIS — R1084 Generalized abdominal pain: Secondary | ICD-10-CM | POA: Diagnosis not present

## 2017-01-21 DIAGNOSIS — R634 Abnormal weight loss: Secondary | ICD-10-CM | POA: Diagnosis not present

## 2017-01-21 DIAGNOSIS — K5900 Constipation, unspecified: Secondary | ICD-10-CM | POA: Diagnosis not present

## 2017-01-22 ENCOUNTER — Telehealth: Payer: Self-pay | Admitting: *Deleted

## 2017-01-22 DIAGNOSIS — E119 Type 2 diabetes mellitus without complications: Secondary | ICD-10-CM

## 2017-01-22 DIAGNOSIS — R5383 Other fatigue: Secondary | ICD-10-CM

## 2017-01-22 NOTE — Telephone Encounter (Signed)
Start by getting note from [facility] for my review.

## 2017-01-22 NOTE — Telephone Encounter (Signed)
Spoke to pt who states she was seen at [facility] 7/9 and states that it was suggested that she has additional labs for ger fatigue. She states she was advised by Dr Diona Browner to contact office back if she didn't improve. Pt requesting a call back to discuss how to proceed

## 2017-01-23 DIAGNOSIS — G40209 Localization-related (focal) (partial) symptomatic epilepsy and epileptic syndromes with complex partial seizures, not intractable, without status epilepticus: Secondary | ICD-10-CM | POA: Diagnosis not present

## 2017-01-23 DIAGNOSIS — G603 Idiopathic progressive neuropathy: Secondary | ICD-10-CM | POA: Diagnosis not present

## 2017-01-23 DIAGNOSIS — R634 Abnormal weight loss: Secondary | ICD-10-CM | POA: Diagnosis not present

## 2017-01-23 DIAGNOSIS — R201 Hypoesthesia of skin: Secondary | ICD-10-CM | POA: Diagnosis not present

## 2017-01-23 DIAGNOSIS — M5417 Radiculopathy, lumbosacral region: Secondary | ICD-10-CM | POA: Diagnosis not present

## 2017-01-23 DIAGNOSIS — R202 Paresthesia of skin: Secondary | ICD-10-CM | POA: Diagnosis not present

## 2017-01-23 DIAGNOSIS — G5603 Carpal tunnel syndrome, bilateral upper limbs: Secondary | ICD-10-CM | POA: Diagnosis not present

## 2017-01-23 DIAGNOSIS — G5623 Lesion of ulnar nerve, bilateral upper limbs: Secondary | ICD-10-CM | POA: Diagnosis not present

## 2017-01-23 DIAGNOSIS — E559 Vitamin D deficiency, unspecified: Secondary | ICD-10-CM | POA: Diagnosis not present

## 2017-01-23 DIAGNOSIS — M5412 Radiculopathy, cervical region: Secondary | ICD-10-CM | POA: Diagnosis not present

## 2017-01-23 NOTE — Telephone Encounter (Signed)
Office note placed in Dr. Rometta Emery in box for her to review on Thursday, when she returns to clinic.

## 2017-01-23 NOTE — Telephone Encounter (Signed)
Office note requested from [facility].

## 2017-01-24 NOTE — Assessment & Plan Note (Signed)
Unclear cause. Re-eval  In next few weeks.

## 2017-01-25 NOTE — Telephone Encounter (Signed)
PT is requesting a cb to discuss these labs.

## 2017-01-25 NOTE — Telephone Encounter (Signed)
Spoke with [name].  She was seen by her Neurologist on Wednesday and he did some labs.  She states when that office calls her with the results she will ask them to fax [place] a copy. She is thinking that they did a lot of the same labs Dr. Diona Browner is wanting.

## 2017-01-25 NOTE — Telephone Encounter (Signed)
Reviewed GI note.  Per note Gi complaints improved.  Continude fatigue... Have her make appt for labs only.. Will re-check cbc, vit levels and thyroid. Ordered.

## 2017-01-25 NOTE — Telephone Encounter (Signed)
See below

## 2017-01-25 NOTE — Telephone Encounter (Signed)
Left message for Ms. Bleau to call back and schedule lab only appointment per Dr. Diona Browner.

## 2017-02-04 DIAGNOSIS — J4540 Moderate persistent asthma, uncomplicated: Secondary | ICD-10-CM | POA: Diagnosis not present

## 2017-02-05 ENCOUNTER — Encounter: Payer: Self-pay | Admitting: Family Medicine

## 2017-02-05 ENCOUNTER — Ambulatory Visit (INDEPENDENT_AMBULATORY_CARE_PROVIDER_SITE_OTHER): Payer: Medicare Other | Admitting: Family Medicine

## 2017-02-05 ENCOUNTER — Ambulatory Visit (INDEPENDENT_AMBULATORY_CARE_PROVIDER_SITE_OTHER): Payer: Medicare Other | Admitting: *Deleted

## 2017-02-05 VITALS — BP 104/72 | HR 102 | Temp 98.1°F | Ht 65.0 in | Wt 224.2 lb

## 2017-02-05 DIAGNOSIS — R7989 Other specified abnormal findings of blood chemistry: Secondary | ICD-10-CM

## 2017-02-05 DIAGNOSIS — R946 Abnormal results of thyroid function studies: Secondary | ICD-10-CM

## 2017-02-05 DIAGNOSIS — J4540 Moderate persistent asthma, uncomplicated: Secondary | ICD-10-CM | POA: Diagnosis not present

## 2017-02-05 DIAGNOSIS — L0291 Cutaneous abscess, unspecified: Secondary | ICD-10-CM | POA: Insufficient documentation

## 2017-02-05 DIAGNOSIS — D72821 Monocytosis (symptomatic): Secondary | ICD-10-CM | POA: Diagnosis not present

## 2017-02-05 LAB — CBC WITH DIFFERENTIAL/PLATELET
Basophils Absolute: 0.1 10*3/uL (ref 0.0–0.1)
Basophils Relative: 1.5 % (ref 0.0–3.0)
Eosinophils Absolute: 0.4 10*3/uL (ref 0.0–0.7)
Eosinophils Relative: 4.3 % (ref 0.0–5.0)
HCT: 43 % (ref 36.0–46.0)
Hemoglobin: 14 g/dL (ref 12.0–15.0)
Lymphocytes Relative: 48.3 % — ABNORMAL HIGH (ref 12.0–46.0)
Lymphs Abs: 3.9 10*3/uL (ref 0.7–4.0)
MCHC: 32.5 g/dL (ref 30.0–36.0)
MCV: 74.9 fl — ABNORMAL LOW (ref 78.0–100.0)
Monocytes Absolute: 0.9 10*3/uL (ref 0.1–1.0)
Monocytes Relative: 10.6 % (ref 3.0–12.0)
Neutro Abs: 2.9 10*3/uL (ref 1.4–7.7)
Neutrophils Relative %: 35.3 % — ABNORMAL LOW (ref 43.0–77.0)
Platelets: 327 10*3/uL (ref 150.0–400.0)
RBC: 5.75 Mil/uL — ABNORMAL HIGH (ref 3.87–5.11)
RDW: 16.6 % — ABNORMAL HIGH (ref 11.5–15.5)
WBC: 8.2 10*3/uL (ref 4.0–10.5)

## 2017-02-05 LAB — T3, FREE: T3, Free: 3.5 pg/mL (ref 2.3–4.2)

## 2017-02-05 LAB — TSH: TSH: 0.01 u[IU]/mL — ABNORMAL LOW (ref 0.35–4.50)

## 2017-02-05 LAB — T4, FREE: Free T4: 1.19 ng/dL (ref 0.60–1.60)

## 2017-02-05 NOTE — Assessment & Plan Note (Signed)
Re-eval with cbc to reassess.

## 2017-02-05 NOTE — Patient Instructions (Signed)
Please stop at the lab to have labs drawn. No further treatment needed for skin change.

## 2017-02-05 NOTE — Assessment & Plan Note (Signed)
Resolving after warm soaks. No further treatment needed.

## 2017-02-05 NOTE — Assessment & Plan Note (Signed)
Eval with free t3 and free t4 

## 2017-02-05 NOTE — Progress Notes (Signed)
   Subjective:    Patient ID: [name], female    DOB: 1945/12/16, 71 y.o.   MRN: [id number]  HPI   71 year old female with history of  DM ( followed by ENDO per pt) presents for follow up labs as well as new knot on her labia.   She has noted marble size bump on left labia 5 days ago.  Sore area.  Has decreased in size each day,  No discharge, no bleeding.   She has changed depends recently.[facility] [place] if this caused a rash.  No fever, no abdomina pain.  Needs repeat cbc to re-eval reactive monocytosis noted on 6/22 labs   Also TSH was low at 0.1 at neurologist.  She was told to start vit D course to increase.  Review of Systems  Constitutional: Negative for fatigue and fever.  HENT: Negative for ear pain.   Eyes: Negative for pain.  Respiratory: Negative for chest tightness and shortness of breath.   Cardiovascular: Negative for chest pain, palpitations and leg swelling.  Gastrointestinal: Negative for abdominal pain.  Genitourinary: Negative for dysuria.       Objective:   Physical Exam  Constitutional: Vital signs are normal. She appears well-developed and well-nourished. She is cooperative.  Non-toxic appearance. She does not appear ill. No distress.  HENT:  Head: Normocephalic.  Right Ear: Hearing, tympanic membrane, external ear and ear canal normal. Tympanic membrane is not erythematous, not retracted and not bulging.  Left Ear: Hearing, tympanic membrane, external ear and ear canal normal. Tympanic membrane is not erythematous, not retracted and not bulging.  Nose: No mucosal edema or rhinorrhea. Right sinus exhibits no maxillary sinus tenderness and no frontal sinus tenderness. Left sinus exhibits no maxillary sinus tenderness and no frontal sinus tenderness.  Mouth/Throat: Uvula is midline, oropharynx is clear and moist and mucous membranes are normal.  Eyes: Pupils are equal, round, and reactive to light. Conjunctivae, EOM and lids are normal. Lids are everted  and swept, no foreign bodies found.  Neck: Trachea normal and normal range of motion. Neck supple. Carotid bruit is not present. No thyroid mass and no thyromegaly present.  Cardiovascular: Normal rate, regular rhythm, S1 normal, S2 normal, normal heart sounds, intact distal pulses and normal pulses.  Exam reveals no gallop and no friction rub.   No murmur heard. Pulmonary/Chest: Effort normal and breath sounds normal. No tachypnea. No respiratory distress. She has no decreased breath sounds. She has no wheezes. She has no rhonchi. She has no rales.  Abdominal: Soft. Normal appearance and bowel sounds are normal. There is no tenderness.  Genitourinary:  Genitourinary Comments:  hyperpimentation around central hair follicle.Marland Kitchen No pain, swelling or redness, or heat.  Neurological: She is alert.  Skin: Skin is warm, dry and intact. No rash noted.  Psychiatric: Her speech is normal and behavior is normal. Judgment and thought content normal. Her mood appears not anxious. Cognition and memory are normal. She does not exhibit a depressed mood.          Assessment & Plan:

## 2017-02-11 ENCOUNTER — Telehealth: Payer: Self-pay | Admitting: Cardiovascular Disease

## 2017-02-11 MED ORDER — IRBESARTAN 150 MG PO TABS: 150.0000 mg | ORAL_TABLET | Freq: Every day | ORAL | 5 refills | Status: DC

## 2017-02-11 NOTE — Telephone Encounter (Signed)
Patient called regarding med change. She currently take valsartan 160mg  daily >> change to irbesartan 150mg  daily. Patient agrees w/plan. Advised patient to monitor home BP every 2-3 days for about 2 weeks to ensure no significant change in home BP readings. Rx(s) sent to pharmacy electronically.

## 2017-02-11 NOTE — Telephone Encounter (Signed)
New message       Pt c/o medication issue:  1. Name of Medication: valsartan 2. How are you currently taking this medication (dosage and times per day)?  160mg  daily 3. Are you having a reaction (difficulty breathing--STAT)? no 4. What is your medication issue? Medication has been recalled.  Please call

## 2017-02-12 DIAGNOSIS — M25561 Pain in right knee: Secondary | ICD-10-CM | POA: Diagnosis not present

## 2017-02-12 DIAGNOSIS — M25511 Pain in right shoulder: Secondary | ICD-10-CM | POA: Diagnosis not present

## 2017-02-12 DIAGNOSIS — G8929 Other chronic pain: Secondary | ICD-10-CM | POA: Diagnosis not present

## 2017-02-14 DIAGNOSIS — E118 Type 2 diabetes mellitus with unspecified complications: Secondary | ICD-10-CM | POA: Diagnosis not present

## 2017-02-14 DIAGNOSIS — G629 Polyneuropathy, unspecified: Secondary | ICD-10-CM | POA: Diagnosis not present

## 2017-02-14 DIAGNOSIS — I10 Essential (primary) hypertension: Secondary | ICD-10-CM | POA: Diagnosis not present

## 2017-02-18 ENCOUNTER — Telehealth: Payer: Self-pay | Admitting: Cardiovascular Disease

## 2017-02-18 ENCOUNTER — Other Ambulatory Visit: Payer: Self-pay | Admitting: Allergy and Immunology

## 2017-02-18 NOTE — Telephone Encounter (Signed)
Patient calling, states that she needs assistance with checking BP.

## 2017-02-18 NOTE — Telephone Encounter (Signed)
Returned call to patient regarding checking home BP. Her ARB was changed last week to irbesartan and patient is unsure how to use home BP cuff to check her readings. She would like to stop by the office tomorrow to have someone show her how to do this. She would like to come at 10am on 8/7. Patient scheduled for nurse visit.

## 2017-02-19 ENCOUNTER — Ambulatory Visit (INDEPENDENT_AMBULATORY_CARE_PROVIDER_SITE_OTHER): Payer: Medicare Other | Admitting: *Deleted

## 2017-02-19 ENCOUNTER — Encounter: Payer: Self-pay | Admitting: Allergy and Immunology

## 2017-02-19 ENCOUNTER — Ambulatory Visit (INDEPENDENT_AMBULATORY_CARE_PROVIDER_SITE_OTHER): Payer: Medicare Other | Admitting: Allergy and Immunology

## 2017-02-19 VITALS — BP 128/76 | HR 92 | Resp 18

## 2017-02-19 DIAGNOSIS — J4540 Moderate persistent asthma, uncomplicated: Secondary | ICD-10-CM | POA: Diagnosis not present

## 2017-02-19 DIAGNOSIS — I272 Pulmonary hypertension, unspecified: Secondary | ICD-10-CM

## 2017-02-19 DIAGNOSIS — I5189 Other ill-defined heart diseases: Secondary | ICD-10-CM

## 2017-02-19 DIAGNOSIS — J3089 Other allergic rhinitis: Secondary | ICD-10-CM

## 2017-02-19 DIAGNOSIS — I519 Heart disease, unspecified: Secondary | ICD-10-CM | POA: Diagnosis not present

## 2017-02-19 DIAGNOSIS — I10 Essential (primary) hypertension: Secondary | ICD-10-CM

## 2017-02-19 DIAGNOSIS — K219 Gastro-esophageal reflux disease without esophagitis: Secondary | ICD-10-CM | POA: Diagnosis not present

## 2017-02-19 NOTE — Patient Instructions (Addendum)
  1. Continue Advair 230 - 2 inhalations twice a day  2. Continue Flovent 220 2 inhalations twice a day added to Advair during "FLARE UP"  3. Continue Spiriva 2.5 respimat two inhalations one time per day.  4. Continue nasal azelastine two sprays each nostril 1-2 times per day if needed  5. Continue pantoprazole 40mg  in the AM and Ranitidine 300mg  in the PM  6. Continue Xolair and Epi-Pen  7. Continue Montelukast 10mg  one tablet one time per day.  8. Use Provental HFA or albuterol nebulization if needed.   9. Prednisone 10mg  tablet one time per day for 5 days only  10. Return to clinic in [place] 2018 or earlier if there is a problem.

## 2017-02-19 NOTE — Progress Notes (Signed)
Follow-up Note  Referring Provider: [name], MD Primary Provider: [name], MD Date of Office Visit: 02/19/2017  Subjective:   [name] (DOB: December 26, 1945) is a 71 y.o. female who returns to the [facility] on 02/19/2017 in re-evaluation of the following:  HPI: [name] returns to this clinic in evaluation of her asthma and allergic rhinitis and pulmonary hypertension and diastolic dysfunction and LPR. I last saw her in this clinic March 2018.  She has really done well throughout the spring and relies on the use of Advair and Spiriva and Xolair and montelukast to treat her atopic respiratory disease with good success. She has not required a systemic steroid or antibiotic to treat her respiratory tract. However, it should be noted that she did receive systemic steroids on 2 occasions for a back issue during this timeframe.  As well, over the course of the past week she developed nasal congestion and clear rhinorrhea and a slight cough and some slight headache without any fever or ugly nasal discharge. She dive have some wheezing associated with this issue and had to use her bronchodilator.  Her reflux is under very good control on her current medical therapy.  Allergies as of 02/19/2017      Reactions   Lyrica [pregabalin] Other (See Comments)   Blurred vision.   Penicillins Swelling   Has patient had a PCN reaction causing immediate rash, facial/tongue/throat swelling, SOB or lightheadedness with hypotension patient had a PCN reaction causing severe rash involving mucus membranes or skin necrosis: [id number] Has patient had a PCN reaction that required hospitalization/No Has patient had a PCN reaction occurring within the last 10 years: No If all of the above answers are "NO", then may proceed with Cephalosporin use.   Phenytoin Sodium Extended Swelling   Swelling    Vicodin [hydrocodone-acetaminophen] Nausea And Vomiting      Medication List        ADVAIR HFA 063-01 MCG/ACT inhaler Generic drug:  fluticasone-salmeterol INHALE 2 PUFFS BY MOUTH TWICE DAILY   albuterol 108 (90 Base) MCG/ACT inhaler Commonly known as:  PROVENTIL HFA Inhale 2 puffs into the lungs every 6 (six) hours as needed for wheezing or shortness of breath.   azelastine 0.1 % nasal spray Commonly known as:  ASTELIN USE 1-2 SPRAYS IN EACH NOSTRIL TWICE DAILY AS NEEDED   Biotin 1 MG Caps Take 1 mg by mouth as needed (muscles).   cyclobenzaprine 10 MG tablet Commonly known as:  FLEXERIL Take 1 tablet (10 mg total) by mouth 2 (two) times daily as needed for muscle spasms.   DAIRY-RELIEF PO Take 1 capsule by mouth daily as needed (stomach upset).   diltiazem 120 MG 24 hr capsule Commonly known as:  CARTIA XT Take 1 capsule (120 mg total) by mouth daily. KEEP OV.   DULoxetine 30 MG capsule Commonly known as:  CYMBALTA Take 30 mg by mouth daily.   EPINEPHrine 0.3 mg/0.3 mL Soaj injection Commonly known as:  EPI-PEN USE AS DIRECTED FOR LIFE THREATENING ALLERGIC REACTIONS   fluticasone 220 MCG/ACT inhaler Commonly known as:  FLOVENT HFA Inhale 2 puffs into the lungs 2 (two) times daily as needed.   furosemide 40 MG tablet Commonly known as:  LASIX Take 1 tablet (40 mg total) by mouth daily.   gabapentin 300 MG capsule Commonly known as:  NEURONTIN Take 600 mg by mouth 3 (three) times daily.   GLYXAMBI 10-5 MG Tabs Generic drug:  Empagliflozin-Linagliptin Take  1 tablet by mouth daily.   irbesartan 150 MG tablet Commonly known as:  AVAPRO Take 1 tablet (150 mg total) by mouth daily.   levalbuterol 1.25 MG/3ML nebulizer solution Commonly known as:  XOPENEX Take 1.25 mg by nebulization every 4 (four) hours as needed for wheezing.   Magnesium 250 MG Tabs Take 1 tablet by mouth daily.   montelukast 10 MG tablet Commonly known as:  SINGULAIR TAKE 1 TABLET BY MOUTH EVERY DAY   nystatin cream Commonly known as:  MYCOSTATIN Apply 1  application topically 2 (two) times daily. Continue 48 hours after rash resolved.   OMEGA 3-6-9 COMPLEX PO Take 2 capsules by mouth daily. Reported on 03/09/538   Dr [facility] DELICA LANCETS 76B Misc as needed (blood check).   ONETOUCH VERIO test strip Generic drug:  glucose blood 1 each by Other route as needed (blood monitoring).   pantoprazole 40 MG tablet Commonly known as:  PROTONIX TAKE 1 TABLET BY MOUTH EVERY DAY   PATADAY 0.2 % Soln Generic drug:  Olopatadine HCl INT 1 GTT IN OU QD PRN   ranitidine 300 MG tablet Commonly known as:  ZANTAC TAKE 1 TABLET BY MOUTH EVERY NIGHT AT BEDTIME   rivaroxaban 20 MG Tabs tablet Commonly known as:  XARELTO Take 1 tablet (20 mg total) by mouth daily with supper.   SPIRIVA RESPIMAT 1.25 MCG/ACT Aers Generic drug:  Tiotropium Bromide Monohydrate INHALE 2 PUFFS BY MOUTH EVERY DAY   SUMAtriptan 100 MG tablet Commonly known as:  IMITREX Take 100 mg x 1 may, repeat in 2 hour for migraine.   traMADol 50 MG tablet Commonly known as:  ULTRAM Take 1 tablet (50 mg total) by mouth every 8 (eight) hours as needed.       Past Medical History:  Diagnosis Date  . Allergic rhinitis   . Allergy   . Arthritis   . Asthma   . Chronic headache   . Diabetes mellitus   . DVT (deep venous thrombosis) ([place])   . Dyspnea   . Heart murmur   . Hypertension   . Pulmonary embolism ([place])   . Pulmonary hypertension ([place])   . Seizures ([place])     Past Surgical History:  Procedure Laterality Date  . ABDOMINAL HYSTERECTOMY     partial, has ovaries  . CARDIAC CATHETERIZATION  12/28/2010   Mod. pulmonary hypertension, normal coronary arteries  . DOPPLER ECHOCARDIOGRAPHY  10/08/2011   EF=>55%,mild asymmetric LVH, mod. TR, mod. PH, mild to mod LA dilatation  . KNEE ARTHROSCOPY Left   . KNEE SURGERY    . Nuclear Stress Test  05/20/2006   No ischemia  . PARTIAL HYSTERECTOMY    . PLANTAR FASCIA SURGERY    . TONSILLECTOMY      Review of systems  negative except as noted in HPI / PMHx or noted below:  Review of Systems  Constitutional: Negative.   HENT: Negative.   Eyes: Negative.   Respiratory: Negative.   Cardiovascular: Negative.   Gastrointestinal: Negative.   Genitourinary: Negative.   Musculoskeletal: Negative.   Skin: Negative.   Neurological: Negative.   Endo/Heme/Allergies: Negative.   Psychiatric/Behavioral: Negative.      Objective:   Vitals:   02/19/17 1140  BP: 128/76  Pulse: 92  Resp: 18          Physical Exam  Constitutional: She is well-developed, well-nourished, and in no distress.  HENT:  Head: Normocephalic.  Right Ear: Tympanic membrane, external ear and ear canal normal.  Left  Ear: Tympanic membrane, external ear and ear canal normal.  Nose: Nose normal. No mucosal edema or rhinorrhea.  Mouth/Throat: Uvula is midline, oropharynx is clear and moist and mucous membranes are normal. No oropharyngeal exudate.  Eyes: Conjunctivae are normal.  Neck: Trachea normal. No tracheal tenderness present. No tracheal deviation present. No thyromegaly present.  Cardiovascular: Normal rate, regular rhythm, S1 normal, S2 normal and normal heart sounds.   No murmur heard. Pulmonary/Chest: Breath sounds normal. No stridor. No respiratory distress. She has no wheezes. She has no rales.  Musculoskeletal: She exhibits no edema.  Lymphadenopathy:       Head (right side): No tonsillar adenopathy present.       Head (left side): No tonsillar adenopathy present.    She has no cervical adenopathy.  Neurological: She is alert. Gait normal.  Skin: No rash noted. She is not diaphoretic. No erythema. Nails show no clubbing.  Psychiatric: Mood and affect normal.    Diagnostics:    Spirometry was performed and demonstrated an FEV1 of 1.88 at 80 % of predicted.  The patient had an Asthma Control Test with the following results: ACT Total Score: 20.    Assessment and Plan:   1. Asthma, moderate persistent,  well-controlled   2. Other allergic rhinitis   3. Pulmonary hypertension ([place])   4. Diastolic dysfunction   5. LPRD (laryngopharyngeal reflux disease)     1. Continue Advair 230 - 2 inhalations twice a day  2. Continue Flovent 220 2 inhalations twice a day added to Advair during "FLARE UP"  3. Continue Spiriva 2.5 respimat two inhalations one time per day.  4. Continue nasal azelastine two sprays each nostril 1-2 times per day if needed  5. Continue pantoprazole 40mg  in the AM and Ranitidine 300mg  in the PM  6. Continue Xolair and Epi-Pen  7. Continue Montelukast 10mg  one tablet one time per day.  8. Use Provental HFA or albuterol nebulization if needed.   9. Prednisone 10mg  tablet one time per day for 5 days only  10. Return to clinic in December 2018 or earlier if there is a problem.  I'm going to give [facility] of very short course of low-dose systemic steroids to treat what appears to be a viral-induced flare of her atopic respiratory disease and continue her on a very large collection of anti-inflammatory medications for her respiratory tract including the use of Xolair and also have her continue to aggressively treat her reflux-induced respiratory disease as noted above. She does have a history of pulmonary hypertension and diastolic dysfunction that does not appear to require any further evaluation or treatment at this point in time.  Allena Katz, MD Allergy / Immunology [place]

## 2017-02-19 NOTE — Progress Notes (Signed)
The patient came in today for a nursing visit to learn how to check her blood pressure at home with her own device. Instructions were given and she verbalized her understanding. She was instructed to call if she had any further questions.

## 2017-02-26 ENCOUNTER — Telehealth: Payer: Self-pay | Admitting: Allergy and Immunology

## 2017-02-26 ENCOUNTER — Other Ambulatory Visit: Payer: Self-pay

## 2017-02-26 MED ORDER — EPINEPHRINE 0.3 MG/0.3ML IJ SOAJ: INTRAMUSCULAR | 2 refills | Status: DC

## 2017-02-26 NOTE — Telephone Encounter (Signed)
Patient was recently seen Patient is still complaining of a runny nose and wheezing What can she do??  Patient also needs a refill on her EPI-En sent to [facility] on [address] in [place]

## 2017-02-26 NOTE — Telephone Encounter (Signed)
FYI: Called patient. Patient is having an asthma flare up . I asked patient if has added in the Flovent 220 2 puffs twice a day. Patient stated she has not added the Flovent or her Albuterol. I recommended the patient to add in the Flovent and her Albuterol. If she still has any problems to give [place] a call. Patient needed an epi-pen sent into pharmacy. Im sending epi to [facility] in [place].

## 2017-03-04 DIAGNOSIS — J4540 Moderate persistent asthma, uncomplicated: Secondary | ICD-10-CM

## 2017-03-05 ENCOUNTER — Ambulatory Visit (INDEPENDENT_AMBULATORY_CARE_PROVIDER_SITE_OTHER): Payer: Medicare Other | Admitting: *Deleted

## 2017-03-05 DIAGNOSIS — J4540 Moderate persistent asthma, uncomplicated: Secondary | ICD-10-CM | POA: Diagnosis not present

## 2017-03-07 DIAGNOSIS — R194 Change in bowel habit: Secondary | ICD-10-CM | POA: Diagnosis not present

## 2017-03-07 DIAGNOSIS — R1084 Generalized abdominal pain: Secondary | ICD-10-CM | POA: Diagnosis not present

## 2017-03-07 DIAGNOSIS — K5900 Constipation, unspecified: Secondary | ICD-10-CM | POA: Diagnosis not present

## 2017-03-11 ENCOUNTER — Telehealth: Payer: Self-pay | Admitting: Allergy and Immunology

## 2017-03-11 NOTE — Telephone Encounter (Signed)
Pt called and wanted to find out does she need a pneumonia shot and a flu shot.

## 2017-03-12 DIAGNOSIS — M79671 Pain in right foot: Secondary | ICD-10-CM | POA: Diagnosis not present

## 2017-03-12 DIAGNOSIS — L603 Nail dystrophy: Secondary | ICD-10-CM | POA: Diagnosis not present

## 2017-03-12 DIAGNOSIS — E1151 Type 2 diabetes mellitus with diabetic peripheral angiopathy without gangrene: Secondary | ICD-10-CM | POA: Diagnosis not present

## 2017-03-12 DIAGNOSIS — I739 Peripheral vascular disease, unspecified: Secondary | ICD-10-CM | POA: Diagnosis not present

## 2017-03-13 DIAGNOSIS — G4733 Obstructive sleep apnea (adult) (pediatric): Secondary | ICD-10-CM | POA: Diagnosis not present

## 2017-03-18 ENCOUNTER — Other Ambulatory Visit: Payer: Self-pay | Admitting: Allergy and Immunology

## 2017-03-19 NOTE — Telephone Encounter (Signed)
Please inform patient that she needs:  Annual Flu vaccine  A Prevanar vaccine, if not already administered.  A pneumovax, 6 months after prevnar.  A shingle vaccine, if not already administered.

## 2017-03-19 NOTE — Telephone Encounter (Signed)
Patient informed verbalized understanding

## 2017-03-19 NOTE — Telephone Encounter (Signed)
Left message to return call 

## 2017-03-22 ENCOUNTER — Telehealth: Payer: Self-pay | Admitting: Family Medicine

## 2017-03-22 DIAGNOSIS — E0840 Diabetes mellitus due to underlying condition with diabetic neuropathy, unspecified: Secondary | ICD-10-CM

## 2017-03-22 DIAGNOSIS — R7989 Other specified abnormal findings of blood chemistry: Secondary | ICD-10-CM

## 2017-03-22 NOTE — Telephone Encounter (Signed)
-----   Message from Eustace Pen, LPN sent at 12/21/6718  4:08 PM EDT ----- Regarding: Labs 9/10 Please place lab orders.   Medicare - traditional

## 2017-03-24 ENCOUNTER — Other Ambulatory Visit: Payer: Self-pay | Admitting: Cardiovascular Disease

## 2017-03-24 ENCOUNTER — Other Ambulatory Visit: Payer: Self-pay | Admitting: Allergy and Immunology

## 2017-03-24 DIAGNOSIS — I10 Essential (primary) hypertension: Secondary | ICD-10-CM

## 2017-03-25 ENCOUNTER — Telehealth: Payer: Self-pay | Admitting: Family Medicine

## 2017-03-25 ENCOUNTER — Ambulatory Visit (INDEPENDENT_AMBULATORY_CARE_PROVIDER_SITE_OTHER): Payer: Medicare Other

## 2017-03-25 VITALS — BP 100/60 | HR 77 | Temp 98.4°F | Ht 64.5 in | Wt 223.0 lb

## 2017-03-25 DIAGNOSIS — E0840 Diabetes mellitus due to underlying condition with diabetic neuropathy, unspecified: Secondary | ICD-10-CM

## 2017-03-25 DIAGNOSIS — Z0000 Encounter for general adult medical examination without abnormal findings: Secondary | ICD-10-CM | POA: Diagnosis not present

## 2017-03-25 DIAGNOSIS — R5383 Other fatigue: Secondary | ICD-10-CM

## 2017-03-25 LAB — COMPREHENSIVE METABOLIC PANEL
ALT: 8 U/L (ref 0–35)
AST: 18 U/L (ref 0–37)
Albumin: 3.8 g/dL (ref 3.5–5.2)
Alkaline Phosphatase: 78 U/L (ref 39–117)
BUN: 14 mg/dL (ref 6–23)
CO2: 27 mEq/L (ref 19–32)
Calcium: 9.5 mg/dL (ref 8.4–10.5)
Chloride: 100 mEq/L (ref 96–112)
Creatinine, Ser: 1.1 mg/dL (ref 0.40–1.20)
GFR: 63.03 mL/min (ref >60.00)
Glucose, Bld: 121 mg/dL — ABNORMAL HIGH (ref 70–99)
Potassium: 4.4 mEq/L (ref 3.5–5.1)
Sodium: 137 mEq/L (ref 135–145)
Total Bilirubin: 0.7 mg/dL (ref 0.2–1.2)
Total Protein: 6.7 g/dL (ref 6.0–8.3)

## 2017-03-25 LAB — LIPID PANEL
Cholesterol: 132 mg/dL (ref 0–200)
HDL: 46.5 mg/dL (ref >39.00)
LDL Cholesterol: 74 mg/dL (ref 0–99)
NonHDL: 85.75
Total CHOL/HDL Ratio: 3
Triglycerides: 58 mg/dL (ref 0.0–149.0)
VLDL: 11.6 mg/dL (ref 0.0–40.0)

## 2017-03-25 LAB — HEMOGLOBIN A1C: Hgb A1c MFr Bld: 5.9 % (ref 4.6–6.5)

## 2017-03-25 LAB — VITAMIN D 25 HYDROXY (VIT D DEFICIENCY, FRACTURES): VITD: 35.38 ng/mL (ref 30.00–100.00)

## 2017-03-25 LAB — VITAMIN B12: Vitamin B-12: 916 pg/mL — ABNORMAL HIGH (ref 211–911)

## 2017-03-25 NOTE — Progress Notes (Signed)
Pre visit review using our clinic review tool, if applicable. No additional management support is needed unless otherwise documented below in the visit note. 

## 2017-03-25 NOTE — Progress Notes (Signed)
Subjective:   [name] is a 71 y.o. female who presents for Medicare Annual (Subsequent) preventive examination.  Review of Systems:  N/A Cardiac Risk Factors include: advanced age (>27men, >17 women);obesity (BMI >30kg/m2);diabetes mellitus;hypertension     Objective:     Vitals: BP 100/60 (BP Location: Right Arm, Patient Position: Sitting, Cuff Size: Normal)   Pulse 77   Temp 98.4 F (36.9 C) (Oral)   Ht 5' 4.5" (1.638 m) Comment: no shoes  Wt 223 lb (101.2 kg)   SpO2 97%   BMI 37.69 kg/m   Body mass index is 37.69 kg/m.   Tobacco History  Smoking Status  . Never Smoker  Smokeless Tobacco  . Never Used     Counseling given: No   Past Medical History:  Diagnosis Date  . Allergic rhinitis   . Allergy   . Arthritis   . Asthma   . Chronic headache   . Diabetes mellitus   . DVT (deep venous thrombosis) ([place])   . Dyspnea   . Heart murmur   . Hypertension   . Pulmonary embolism ([place])   . Pulmonary hypertension ([place])   . Seizures ([place])    Past Surgical History:  Procedure Laterality Date  . ABDOMINAL HYSTERECTOMY     partial, has ovaries  . CARDIAC CATHETERIZATION  12/28/2010   Mod. pulmonary hypertension, normal coronary arteries  . DOPPLER ECHOCARDIOGRAPHY  10/08/2011   EF=>55%,mild asymmetric LVH, mod. TR, mod. PH, mild to mod LA dilatation  . KNEE ARTHROSCOPY Left   . KNEE SURGERY    . Nuclear Stress Test  05/20/2006   No ischemia  . PARTIAL HYSTERECTOMY    . PLANTAR FASCIA SURGERY    . TONSILLECTOMY     Family History  Problem Relation Age of Onset  . Hypertension Mother   . Clotting disorder Mother   . Breast cancer Mother   . Arthritis Mother   . Stroke Mother   . Diabetes Mother   . Cancer Brother   . Alcohol abuse Father   . Arthritis Sister   . Diabetes Sister   . Multiple sclerosis Sister   . Allergies Other        grandson   History  Sexual Activity  . Sexual activity: No    Outpatient Encounter Prescriptions as of  03/25/2017  Medication Sig  . ADVAIR HFA 230-21 MCG/ACT inhaler INHALE 2 PUFFS BY MOUTH TWICE DAILY  . albuterol (PROVENTIL HFA) 108 (90 BASE) MCG/ACT inhaler Inhale 2 puffs into the lungs every 6 (six) hours as needed for wheezing or shortness of breath.  Marland Kitchen azelastine (ASTELIN) 0.1 % nasal spray USE 1-2 SPRAYS IN EACH NOSTRIL TWICE DAILY AS NEEDED  . Biotin 1 MG CAPS Take 1 mg by mouth as needed (muscles).   . cyclobenzaprine (FLEXERIL) 10 MG tablet Take 1 tablet (10 mg total) by mouth 2 (two) times daily as needed for muscle spasms.  Marland Kitchen diltiazem (CARTIA XT) 120 MG 24 hr capsule Take 1 capsule (120 mg total) by mouth daily. KEEP OV.  . DULoxetine (CYMBALTA) 30 MG capsule Take 30 mg by mouth daily.  . Empagliflozin-Linagliptin (GLYXAMBI) 10-5 MG TABS Take 1 tablet by mouth daily.  Marland Kitchen EPINEPHrine 0.3 mg/0.3 mL IJ SOAJ injection USE AS DIRECTED FOR LIFE THREATENING ALLERGIC REACTIONS  . fluticasone (FLOVENT HFA) 220 MCG/ACT inhaler Inhale 2 puffs into the lungs 2 (two) times daily as needed.  . furosemide (LASIX) 40 MG tablet TAKE 1 TABLET(40 MG) BY MOUTH DAILY  .  gabapentin (NEURONTIN) 300 MG capsule Take 600 mg by mouth 3 (three) times daily.  . irbesartan (AVAPRO) 150 MG tablet Take 1 tablet (150 mg total) by mouth daily.  . Lactase (DAIRY-RELIEF PO) Take 1 capsule by mouth daily as needed (stomach upset).   Marland Kitchen levalbuterol (XOPENEX) 1.25 MG/3ML nebulizer solution Take 1.25 mg by nebulization every 4 (four) hours as needed for wheezing.  . Magnesium 250 MG TABS Take 1 tablet by mouth daily.  . montelukast (SINGULAIR) 10 MG tablet TAKE 1 TABLET BY MOUTH EVERY DAY  . montelukast (SINGULAIR) 10 MG tablet TAKE 1 TABLET BY MOUTH EVERY DAY  . nystatin cream (MYCOSTATIN) Apply 1 application topically 2 (two) times daily. Continue 48 hours after rash resolved.  . Omega 3-6-9 Fatty Acids (OMEGA 3-6-9 COMPLEX PO) Take 2 capsules by mouth daily. Reported on 08/02/2015  . ONETOUCH DELICA LANCETS 16X MISC as  needed (blood check).   [name] VERIO test strip 1 each by Other route as needed (blood monitoring).   . pantoprazole (PROTONIX) 40 MG tablet TAKE 1 TABLET BY MOUTH EVERY DAY  . PATADAY 0.2 % SOLN INT 1 GTT IN OU QD PRN  . ranitidine (ZANTAC) 300 MG tablet TAKE 1 TABLET BY MOUTH EVERY NIGHT AT BEDTIME  . rivaroxaban (XARELTO) 20 MG TABS tablet Take 1 tablet (20 mg total) by mouth daily with supper.  Marland Kitchen SPIRIVA RESPIMAT 1.25 MCG/ACT AERS INHALE 2 PUFFS BY MOUTH EVERY DAY  . SUMAtriptan (IMITREX) 100 MG tablet Take 100 mg x 1 may, repeat in 2 hour for migraine.  . traMADol (ULTRAM) 50 MG tablet Take 1 tablet (50 mg total) by mouth every 8 (eight) hours as needed.   Facility-Administered Encounter Medications as of 03/25/2017  Medication  . omalizumab Arvid Right) injection 300 mg    Activities of Daily Living In your present state of health, do you have any difficulty performing the following activities: 03/25/2017  Hearing? N  Vision? N  Difficulty concentrating or making decisions? N  Walking or climbing stairs? N  Dressing or bathing? N  Doing errands, shopping? N  Preparing Food and eating ? N  Using the Toilet? N  In the past six months, have you accidently leaked urine? N  Do you have problems with loss of bowel control? N  Managing your Medications? N  Managing your Finances? N  Housekeeping or managing your Housekeeping? N  Some recent data might be hidden    Patient Care Team: [name], MD as PCP - General (Family Medicine) Hortencia Pilar, MD as Consulting Physician (Ophthalmology) Neldon Mc, Donnamarie Poag, MD as Consulting Physician (Allergy and Immunology) Anda Kraft, MD as Consulting Physician (Endocrinology) Inocencio Homes, DPM as Consulting Physician (Podiatry) Croitoru, Dani Gobble, MD as Consulting Physician (Cardiology) Druscilla Brownie, MD as Consulting Physician (Dermatology) Latanya Maudlin, MD as Consulting Physician (Orthopedic Surgery) Boyd Kerbs, MD as  Referring Physician (Specialist) Dionisio David, [place] as Referring Physician (Chiropractic Medicine)    Assessment:     Hearing Screening   125Hz  250Hz  500Hz  1000Hz  2000Hz  3000Hz  4000Hz  6000Hz  8000Hz   Right ear:   40 40 40  40    Left ear:   40 40 40  0    Vision Screening Comments: Last vision exam in May 2018   Exercise Activities and Dietary recommendations Current Exercise Habits: The patient does not participate in regular exercise at present (pt plans to exercise at gym for 45-60 min 3 days per week), Exercise limited by: None identified  Goals    .  Increase water intake          Starting 03/25/2017, I will continue to drink at least 6-8 glasses of water daily.       Fall Risk Fall Risk  03/25/2017 03/21/2016 06/08/2015 05/05/2015 04/23/2014  Falls in the past year? No No Yes Yes Yes  Number falls in past yr: - - 1 1 1   Injury with Fall? - - Yes No No  Risk Factor Category  - - (No Data) - -  Comment - - fell out of bed - -   Depression Screen PHQ 2/9 Scores 03/25/2017 03/21/2016 06/08/2015 05/05/2015  PHQ - 2 Score 2 1 0 0  PHQ- 9 Score 7 - - -     Cognitive Function MMSE - Mini Mental State Exam 03/25/2017 03/21/2016  Orientation to time 5 5  Orientation to Place 5 5  Registration 3 3  Attention/ Calculation 0 0  Recall 3 3  Language- name 2 objects 0 0  Language- repeat 1 1  Language- follow 3 step command 3 3  Language- read & follow direction 0 0  Write a sentence 0 0  Copy design 0 0  Total score 20 20     PLEASE NOTE: A Mini-Cog screen was completed. Maximum score is 20. A value of 0 denotes this part of [place] MMSE was not completed or the patient failed this part of the Mini-Cog screening.   Mini-Cog Screening Orientation to Time - Max 5 pts Orientation to Place - Max 5 pts Registration - Max 3 pts Recall - Max 3 pts Language Repeat - Max 1 pts Language Follow 3 Step Command - Max 3 pts     Immunization History  Administered Date(s) Administered  .  Influenza,inj,Quad PF,6+ Mos 03/25/2014, 03/22/2016  . Influenza-Unspecified 04/13/2015  . Pneumococcal Conjugate-13 05/05/2015  . Pneumococcal Polysaccharide-23 04/03/2012  . Tdap 10/19/2013  . Zoster 05/11/2013   Screening Tests Health Maintenance  Topic Date Due  . FOOT EXAM  05/03/2017 (Originally 05/04/2016)  . INFLUENZA VACCINE  10/13/2017 (Originally 02/13/2017)  . HEMOGLOBIN A1C  09/22/2017  . OPHTHALMOLOGY EXAM  12/13/2017  . MAMMOGRAM  09/28/2018  . TETANUS/TDAP  10/20/2023  . COLONOSCOPY  12/15/2023  . DEXA SCAN  Completed  . Hepatitis C Screening  Completed  . PNA vac Low Risk Adult  Completed      Plan:  I have personally reviewed and addressed the Medicare Annual Wellness questionnaire and have noted the following in the patient's chart:  A. Medical and social history B. Use of alcohol, tobacco or illicit drugs  C. Current medications and supplements D. Functional ability and status E.  Nutritional status F.  Physical activity G. Advance directives H. List of other physicians I.  Hospitalizations, surgeries, and ER visits in previous 12 months J.  [place] to include hearing, vision, cognitive, depression L. Referrals and appointments - none  In addition, I have reviewed and discussed with patient certain preventive protocols, quality metrics, and best practice recommendations. A written personalized care plan for preventive services as well as general preventive health recommendations were provided to patient.  See attached scanned questionnaire for additional information.   Signed,   Lindell Noe, MHA, BS, LPN Health Coach

## 2017-03-25 NOTE — Progress Notes (Signed)
I reviewed health advisor's note, was available for consultation, and agree with documentation and plan.   Signed,  Keigen Caddell T. Branson Kranz, MD  

## 2017-03-25 NOTE — Progress Notes (Signed)
PCP notes:   Health maintenance:  Foot exam - PCP please address at next appt Flu vaccine - pt requested to have vaccine at CPE A1C - completed  Abnormal screenings:   Hearing- failed Depression score: 7  Patient concerns:   Left shoulder pain - onset: 03/23/17. Pain scale: 10/10.   Nurse concerns:  None  Next PCP appt:   03/29/17 @ 1115

## 2017-03-25 NOTE — Patient Instructions (Signed)
[name] , Thank you for taking time to come for your Medicare Wellness Visit. I appreciate your ongoing commitment to your health goals. Please review the following plan we discussed and let me know if I can assist you in the future.   These are the goals we discussed: Goals    . Increase water intake          Starting 03/25/2017, I will continue to drink at least 6-8 glasses of water daily.        This is a list of the screening recommended for you and due dates:  Health Maintenance  Topic Date Due  . Complete foot exam   05/03/2017*  . Flu Shot  10/13/2017*  . Hemoglobin A1C  09/22/2017  . Eye exam for diabetics  12/13/2017  . Mammogram  09/28/2018  . Tetanus Vaccine  10/20/2023  . Colon Cancer Screening  12/15/2023  . DEXA scan (bone density measurement)  Completed  .  Hepatitis C: One time screening is recommended by Center for Disease Control  (CDC) for  adults born from [birth year] through 1964.   Completed  . Pneumonia vaccines  Completed  *Topic was postponed. The date shown is not the original due date.   Preventive Care for Adults  A healthy lifestyle and preventive care can promote health and wellness. Preventive health guidelines for adults include the following key practices.  . A routine yearly physical is a good way to check with your health care provider about your health and preventive screening. It is a chance to share any concerns and updates on your health and to receive a thorough exam.  . Visit your dentist for a routine exam and preventive care every 6 months. Brush your teeth twice a day and floss once a day. Good oral hygiene prevents tooth decay and gum disease.  . The frequency of eye exams is based on your age, health, family medical history, use  of contact lenses, and other factors. Follow your health care provider's ecommendations for frequency of eye exams.  . Eat a healthy diet. Foods like vegetables, fruits, whole grains, low-fat dairy products,  and lean protein foods contain the nutrients you need without too many calories. Decrease your intake of foods high in solid fats, added sugars, and salt. Eat the right amount of calories for you. Get information about a proper diet from your health care provider, if necessary.  . Regular physical exercise is one of the most important things you can do for your health. Most adults should get at least 150 minutes of moderate-intensity exercise (any activity that increases your heart rate and causes you to sweat) each week. In addition, most adults need muscle-strengthening exercises on 2 or more days a week.  Silver Sneakers may be a benefit available to you. To determine eligibility, you may visit the website: www.silversneakers.com or contact program at 309-345-8111 Mon-Fri between 8AM-8PM.   . Maintain a healthy weight. The body mass index (BMI) is a screening tool to identify possible weight problems. It provides an estimate of body fat based on height and weight. Your health care provider can find your BMI and can help you achieve or maintain a healthy weight.   For adults 20 years and older: ? A BMI below 18.5 is considered underweight. ? A BMI of 18.5 to 24.9 is normal. ? A BMI of 25 to 29.9 is considered overweight. ? A BMI of 30 and above is considered obese.   . Maintain normal blood  lipids and cholesterol levels by exercising and minimizing your intake of saturated fat. Eat a balanced diet with plenty of fruit and vegetables. Blood tests for lipids and cholesterol should begin at age 53 and be repeated every 5 years. If your lipid or cholesterol levels are high, you are over 50, or you are at high risk for heart disease, you may need your cholesterol levels checked more frequently. Ongoing high lipid and cholesterol levels should be treated with medicines if diet and exercise are not working.  . If you smoke, find out from your health care provider how to quit. If you do not use tobacco,  please do not start.  . If you choose to drink alcohol, please do not consume more than 2 drinks per day. One drink is considered to be 12 ounces (355 mL) of beer, 5 ounces (148 mL) of wine, or 1.5 ounces (44 mL) of liquor.  . If you are 35-74 years old, ask your health care provider if you should take aspirin to prevent strokes.  . Use sunscreen. Apply sunscreen liberally and repeatedly throughout the day. You should seek shade when your shadow is shorter than you. Protect yourself by wearing long sleeves, pants, a wide-brimmed hat, and sunglasses year round, whenever you are outdoors.  . Once a month, do a whole body skin exam, using a mirror to look at the skin on your back. Tell your health care provider of new moles, moles that have irregular borders, moles that are larger than a pencil eraser, or moles that have changed in shape or color.

## 2017-03-25 NOTE — Telephone Encounter (Signed)
Caller Name:Virgia Slauson Relationship to Patient:self Best number:[phone] Pharmacy:  Reason for call: pt is requesting a call back from you, some things that she forgot to tell you at her visit.

## 2017-03-25 NOTE — Telephone Encounter (Signed)
Returned phone call to patient

## 2017-03-27 DIAGNOSIS — M5417 Radiculopathy, lumbosacral region: Secondary | ICD-10-CM | POA: Diagnosis not present

## 2017-03-27 DIAGNOSIS — R202 Paresthesia of skin: Secondary | ICD-10-CM | POA: Diagnosis not present

## 2017-03-27 DIAGNOSIS — M5412 Radiculopathy, cervical region: Secondary | ICD-10-CM | POA: Diagnosis not present

## 2017-03-27 DIAGNOSIS — G40209 Localization-related (focal) (partial) symptomatic epilepsy and epileptic syndromes with complex partial seizures, not intractable, without status epilepticus: Secondary | ICD-10-CM | POA: Diagnosis not present

## 2017-03-28 DIAGNOSIS — M25511 Pain in right shoulder: Secondary | ICD-10-CM | POA: Diagnosis not present

## 2017-03-29 ENCOUNTER — Ambulatory Visit: Payer: Medicare Other | Admitting: Family Medicine

## 2017-04-01 ENCOUNTER — Other Ambulatory Visit: Payer: Self-pay | Admitting: Allergy and Immunology

## 2017-04-02 ENCOUNTER — Ambulatory Visit: Payer: Medicare Other

## 2017-04-03 ENCOUNTER — Telehealth: Payer: Self-pay | Admitting: Cardiovascular Disease

## 2017-04-03 NOTE — Telephone Encounter (Signed)
Returned call to patient she stated she has been having a lot of gas pain in abdomen for the past 3 weeks.Stated today she is having gas pain in chest.Burping relieves pain.She is taking protonix 40 mg daily.She wants to know if she can take gas x.Stated she will drink ginger ale first.She has appointment with Dr.Croitoru Friday 9/21/8.Advised I will send message to Dr.Croitoru.

## 2017-04-03 NOTE — Telephone Encounter (Signed)
New Message  Pt call requesting to speak with RN. Pt states she is having gas pains around her heart and would like to know if she could take Gas X. Please call back to discuss

## 2017-04-03 NOTE — Telephone Encounter (Signed)
Returned call to patient Dr.Croitoru's recommendations given.She stated ginger ale has relieved gas pain in chest.Advised if she continues to have chest pain go to [facility] ED.

## 2017-04-03 NOTE — Telephone Encounter (Signed)
GasX and ginger ale are fine, but like to make sure that the symptoms are not cardiac in etiology. If her symptoms are severe, last more than 30 minutes and are not relieved by the Gas-X and/or antacids, she is to have an ECG done to make sure this is not cardiac related. MCr

## 2017-04-04 ENCOUNTER — Ambulatory Visit (INDEPENDENT_AMBULATORY_CARE_PROVIDER_SITE_OTHER): Payer: Medicare Other | Admitting: Family Medicine

## 2017-04-04 ENCOUNTER — Encounter: Payer: Medicare Other | Admitting: Family Medicine

## 2017-04-04 ENCOUNTER — Encounter: Payer: Self-pay | Admitting: Family Medicine

## 2017-04-04 VITALS — BP 120/72 | HR 59 | Temp 97.7°F | Ht 64.5 in | Wt 226.5 lb

## 2017-04-04 DIAGNOSIS — I10 Essential (primary) hypertension: Secondary | ICD-10-CM | POA: Diagnosis not present

## 2017-04-04 DIAGNOSIS — Z0000 Encounter for general adult medical examination without abnormal findings: Secondary | ICD-10-CM

## 2017-04-04 DIAGNOSIS — J4540 Moderate persistent asthma, uncomplicated: Secondary | ICD-10-CM

## 2017-04-04 DIAGNOSIS — E1340 Other specified diabetes mellitus with diabetic neuropathy, unspecified: Secondary | ICD-10-CM

## 2017-04-04 DIAGNOSIS — F325 Major depressive disorder, single episode, in full remission: Secondary | ICD-10-CM | POA: Diagnosis not present

## 2017-04-04 DIAGNOSIS — E0840 Diabetes mellitus due to underlying condition with diabetic neuropathy, unspecified: Secondary | ICD-10-CM | POA: Diagnosis not present

## 2017-04-04 DIAGNOSIS — I272 Pulmonary hypertension, unspecified: Secondary | ICD-10-CM | POA: Diagnosis not present

## 2017-04-04 DIAGNOSIS — I480 Paroxysmal atrial fibrillation: Secondary | ICD-10-CM

## 2017-04-04 DIAGNOSIS — Z23 Encounter for immunization: Secondary | ICD-10-CM | POA: Diagnosis not present

## 2017-04-04 LAB — HM DIABETES FOOT EXAM

## 2017-04-04 NOTE — Progress Notes (Signed)
Subjective:    Patient ID: [name], female    DOB: 1945/08/04, 71 y.o.   MRN: [id number]  HPI  The patient presents for complete physical and review of chronic health problems.   The patient saw Candis Musa, LPN for medicare wellness. Note reviewed in detail and important notes copied below. Health maintenance: Foot exam - PCP please address at next appt Flu vaccine - pt requested to have vaccine at CPE A1C - completed Abnormal screenings:  Hearing- failed Depression score: 7 Patient concerns:  Left shoulder pain - onset: 03/23/17. Pain scale: 10/10.    TODAY: 04/04/17    Has upcoming appt with Dr. Abbey Chatters on Monday. For likely MRI  Lumbar given leg pain, right.  Using tramadol off and on.   Parox afib: followed by cards: anticoag with xarelto.  Asthma, moderate persistent: Stable control on flovent, spiriva singulair and xopenex prn. Followed by Dr. Neldon Mc allergist.  Diabetes:  With neuropathy  On DPP4 Inh. Lab Results  Component Value Date   HGBA1C 5.9 03/25/2017  Using medications without difficulties: Hypoglycemic episodes:none Hyperglycemic episodes: none Feet problems:none Blood Sugars averaging: 120-155 eye exam within last year: yes Working on getting back to regular exercise.  Neuropathy : Treated with gabapentin.  Depression; well controlled on cymbalta  Elevated Cholesterol:  Good control on medication. Lab Results  Component Value Date   CHOL 132 03/25/2017   HDL 46.50 03/25/2017   LDLCALC 74 03/25/2017   TRIG 58.0 03/25/2017   CHOLHDL 3 03/25/2017    Hypertension:  Good controlled Using medication without problems or lightheadedness:  none Chest pain with exertion: none Edema:none Short of breath: stable Average home BPs: Other issues:   Social History /Family History/Past Medical History reviewed in detail and updated in EMR if needed. Blood pressure 120/72, pulse (!) 59, temperature 97.7 F (36.5 C), temperature source  Oral, height 5' 4.5" (1.638 m), weight 226 lb 8 oz (102.7 kg), SpO2 98 %.   Review of Systems  Constitutional: Negative for fatigue and fever.  HENT: Negative for congestion.   Eyes: Negative for pain.  Respiratory: Negative for cough and shortness of breath.   Cardiovascular: Negative for chest pain, palpitations and leg swelling.  Gastrointestinal: Positive for constipation. Negative for abdominal pain.       Using miralax.. Every 2 days, soft  occ using linzess.. Causes diarrhea.  Genitourinary: Negative for dysuria and vaginal bleeding.  Musculoskeletal: Negative for back pain.  Neurological: Negative for syncope, light-headedness and headaches.  Psychiatric/Behavioral: Negative for dysphoric mood.       Objective:   Physical Exam  Constitutional: Vital signs are normal. She appears well-developed and well-nourished. She is cooperative.  Non-toxic appearance. She does not appear ill. No distress.  overweight  HENT:  Head: Normocephalic.  Right Ear: Hearing, tympanic membrane, external ear and ear canal normal.  Left Ear: Hearing, tympanic membrane, external ear and ear canal normal.  Nose: Nose normal.  Eyes: Pupils are equal, round, and reactive to light. Conjunctivae, EOM and lids are normal. Lids are everted and swept, no foreign bodies found.  Neck: Trachea normal and normal range of motion. Neck supple. Carotid bruit is not present. No thyroid mass and no thyromegaly present.  Cardiovascular: Normal rate, regular rhythm, S1 normal, S2 normal, normal heart sounds and intact distal pulses.  Exam reveals no gallop.   No murmur heard. Pulmonary/Chest: Effort normal and breath sounds normal. No respiratory distress. She has no wheezes. She has no rhonchi. She has  no rales.  Abdominal: Soft. Normal appearance and bowel sounds are normal. She exhibits no distension, no fluid wave, no abdominal bruit and no mass. There is no hepatosplenomegaly. There is no tenderness. There is no  rebound, no guarding and no CVA tenderness. No hernia.  Lymphadenopathy:    She has no cervical adenopathy.    She has no axillary adenopathy.  Neurological: She is alert. She has normal strength. No cranial nerve deficit or sensory deficit.  Skin: Skin is warm, dry and intact. No rash noted.  Psychiatric: Her speech is normal and behavior is normal. Judgment normal. Her mood appears not anxious. Cognition and memory are normal. She does not exhibit a depressed mood.      Diabetic foot exam: Normal inspection No skin breakdown No calluses  Normal DP pulses Normal sensation to light touch and monofilament Nails normal     Assessment & Plan:  The patient's preventative maintenance and recommended screening tests for an annual wellness exam were reviewed in full today. Brought up to date unless services declined.  Counselled on the importance of diet, exercise, and its role in overall health and mortality. The patient's FH and SH was reviewed, including their home life, tobacco status, and drug and alcohol status.   Vaccines:  uptodate, due for flu  colon:  2014 nml repeat in 2024 Mammogram: uptodate 09/2016 Hep C: neg  nonsmoker

## 2017-04-04 NOTE — Assessment & Plan Note (Signed)
Followed by Dr. Isaiah Serge.

## 2017-04-04 NOTE — Assessment & Plan Note (Signed)
Well controlled. Continue current medication.  

## 2017-04-04 NOTE — Assessment & Plan Note (Signed)
Followed by cardiology. Anticoagulated with xarelto.

## 2017-04-05 ENCOUNTER — Ambulatory Visit (INDEPENDENT_AMBULATORY_CARE_PROVIDER_SITE_OTHER): Payer: Medicare Other | Admitting: Cardiovascular Disease

## 2017-04-05 VITALS — BP 140/62 | HR 85 | Ht 64.0 in | Wt 226.0 lb

## 2017-04-05 DIAGNOSIS — E0840 Diabetes mellitus due to underlying condition with diabetic neuropathy, unspecified: Secondary | ICD-10-CM

## 2017-04-05 DIAGNOSIS — I272 Pulmonary hypertension, unspecified: Secondary | ICD-10-CM | POA: Diagnosis not present

## 2017-04-05 DIAGNOSIS — Z86711 Personal history of pulmonary embolism: Secondary | ICD-10-CM | POA: Diagnosis not present

## 2017-04-05 DIAGNOSIS — K5900 Constipation, unspecified: Secondary | ICD-10-CM | POA: Diagnosis not present

## 2017-04-05 DIAGNOSIS — I10 Essential (primary) hypertension: Secondary | ICD-10-CM | POA: Diagnosis not present

## 2017-04-05 DIAGNOSIS — Z6838 Body mass index (BMI) 38.0-38.9, adult: Secondary | ICD-10-CM | POA: Diagnosis not present

## 2017-04-05 DIAGNOSIS — I480 Paroxysmal atrial fibrillation: Secondary | ICD-10-CM

## 2017-04-05 DIAGNOSIS — R194 Change in bowel habit: Secondary | ICD-10-CM | POA: Diagnosis not present

## 2017-04-05 DIAGNOSIS — Z7901 Long term (current) use of anticoagulants: Secondary | ICD-10-CM

## 2017-04-05 DIAGNOSIS — E785 Hyperlipidemia, unspecified: Secondary | ICD-10-CM

## 2017-04-05 NOTE — Patient Instructions (Signed)
Dr Croitoru recommends that you schedule a follow-up appointment in 12 months. You will receive a reminder letter in the mail two months in advance. If you don't receive a letter, please call our office to schedule the follow-up appointment.  If you need a refill on your cardiac medications before your next appointment, please call your pharmacy. 

## 2017-04-05 NOTE — Progress Notes (Signed)
Cardiology Office Note    Date:  04/06/2017   ID:  Alicia, Price Jun 03, 1946, MRN [id number]  PCP:  [name], MD  Cardiologist:   Sanda Klein, MD   Chief complaint: Atrial fibrillation follow-up   History of Present Illness:  [name] is a 71 y.o. female with paroxysmal atrial fibrillation, remote DVT/PE, moderate pulmonary hypertension, chronic reactive airway disease and degenerative joint disease returning for follow-up.  She cold are office a couple of days ago with complaints of chest pain, but she now thinks that was just gas. The chest pain got better with ginger ale. She denies exertional chest pain or exertional dyspnea. She has occasional wheezing and dyspnea at rest, he takes inhalers. These provide relief. She takes furosemide infrequently for leg edema. She does not need this more than once every 2 or 3 months. She has not had any serious bleeding events.  The last clinically evident episode of atrial fibrillation was more than a year ago. She converted spontaneously to sinus rhythm after diltiazem was added for rate control.  She has lost substantial weight after empagliflozin (Glyxambi) was added for diabetes control. Overall she has lost 25 pounds since February 2017. Unfortunately, she remains severely obese with a BMI of almost 39.  She has normal coronary arteries by previous coronary angiography and had moderate pulmonary artery hypertension by right heart catheterization (systolic 50 mmHg). She is taking Xarelto without bleeding problems. She has had a previous equivocal workup for hypercoagulable conditions (lupus anticoagulant positive on one of 2 separate assays, protein S activity decreased with normal total protein S level).    Past Medical History:  Diagnosis Date  . Allergic rhinitis   . Allergy   . Arthritis   . Asthma   . Chronic headache   . Diabetes mellitus   . DVT (deep venous thrombosis) ([place])   . Dyspnea   . Heart murmur     . Hypertension   . Pulmonary embolism ([place])   . Pulmonary hypertension ([place])   . Seizures ([place])     Past Surgical History:  Procedure Laterality Date  . ABDOMINAL HYSTERECTOMY     partial, has ovaries  . CARDIAC CATHETERIZATION  12/28/2010   Mod. pulmonary hypertension, normal coronary arteries  . DOPPLER ECHOCARDIOGRAPHY  10/08/2011   EF=>55%,mild asymmetric LVH, mod. TR, mod. PH, mild to mod LA dilatation  . KNEE ARTHROSCOPY Left   . KNEE SURGERY    . Nuclear Stress Test  05/20/2006   No ischemia  . PARTIAL HYSTERECTOMY    . PLANTAR FASCIA SURGERY    . TONSILLECTOMY      Current Medications: Outpatient Medications Prior to Visit  Medication Sig Dispense Refill  . ADVAIR HFA 230-21 MCG/ACT inhaler INHALE 2 PUFFS BY MOUTH TWICE DAILY 12 g 4  . albuterol (PROVENTIL HFA) 108 (90 BASE) MCG/ACT inhaler Inhale 2 puffs into the lungs every 6 (six) hours as needed for wheezing or shortness of breath. 1 Inhaler 0  . azelastine (ASTELIN) 0.1 % nasal spray USE 1-2 SPRAYS IN EACH NOSTRIL TWICE DAILY AS NEEDED 30 mL 5  . Biotin 1 MG CAPS Take 1 mg by mouth as needed (muscles).     . cyclobenzaprine (FLEXERIL) 10 MG tablet Take 1 tablet (10 mg total) by mouth 2 (two) times daily as needed for muscle spasms. 15 tablet 0  . diltiazem (CARTIA XT) 120 MG 24 hr capsule Take 1 capsule (120 mg total) by mouth daily. KEEP OV.  30 capsule 2  . DULoxetine (CYMBALTA) 30 MG capsule Take 30 mg by mouth daily.    . Empagliflozin-Linagliptin (GLYXAMBI) 10-5 MG TABS Take 1 tablet by mouth daily.    Marland Kitchen EPINEPHrine 0.3 mg/0.3 mL IJ SOAJ injection USE AS DIRECTED FOR LIFE THREATENING ALLERGIC REACTIONS 2 Device 2  . fluticasone (FLOVENT HFA) 220 MCG/ACT inhaler Inhale 2 puffs into the lungs 2 (two) times daily as needed. 1 Inhaler 5  . furosemide (LASIX) 40 MG tablet TAKE 1 TABLET(40 MG) BY MOUTH DAILY 30 tablet 3  . gabapentin (NEURONTIN) 300 MG capsule Take 600 mg by mouth 3 (three) times daily.  6  .  irbesartan (AVAPRO) 150 MG tablet Take 1 tablet (150 mg total) by mouth daily. 30 tablet 5  . Lactase (DAIRY-RELIEF PO) Take 1 capsule by mouth daily as needed (stomach upset).     Marland Kitchen levalbuterol (XOPENEX) 1.25 MG/3ML nebulizer solution Take 1.25 mg by nebulization every 4 (four) hours as needed for wheezing. 150 mL 0  . Magnesium 250 MG TABS Take 1 tablet by mouth daily.    . montelukast (SINGULAIR) 10 MG tablet TAKE 1 TABLET BY MOUTH EVERY DAY 30 tablet 5  . nystatin cream (MYCOSTATIN) Apply 1 application topically 2 (two) times daily. Continue 48 hours after rash resolved. 30 g 1  . Omega 3-6-9 Fatty Acids (OMEGA 3-6-9 COMPLEX PO) Take 2 capsules by mouth daily. Reported on 08/02/2015    . ONETOUCH DELICA LANCETS 09U MISC as needed (blood check).     [name] VERIO test strip 1 each by Other route as needed (blood monitoring).     . pantoprazole (PROTONIX) 40 MG tablet TAKE 1 TABLET BY MOUTH EVERY DAY 90 tablet 4  . PATADAY 0.2 % SOLN INT 1 GTT IN OU QD PRN  4  . rivaroxaban (XARELTO) 20 MG TABS tablet Take 1 tablet (20 mg total) by mouth daily with supper. 30 tablet 5  . SPIRIVA RESPIMAT 1.25 MCG/ACT AERS INHALE 2 PUFFS BY MOUTH EVERY DAY 4 g 2  . SUMAtriptan (IMITREX) 100 MG tablet Take 100 mg x 1 may, repeat in 2 hour for migraine. 10 tablet 0  . traMADol (ULTRAM) 50 MG tablet Take 1 tablet (50 mg total) by mouth every 8 (eight) hours as needed. 30 tablet 0   Facility-Administered Medications Prior to Visit  Medication Dose Route Frequency Provider Last Rate Last Dose  . omalizumab Arvid Right) injection 300 mg  300 mg Subcutaneous Q28 days [name], MD   300 mg at 03/05/17 1835     Allergies:   Lyrica [pregabalin]; Penicillins; Phenytoin sodium extended; and Vicodin [hydrocodone-acetaminophen]   Social History   Social History  . Marital status: Widowed    Spouse name: N/A  . Number of children: Y  . Years of education: N/A   Occupational History  . retired Geologist, engineering.      Social History Main Topics  . Smoking status: Never Smoker  . Smokeless tobacco: Never Used  . Alcohol use 0.0 oz/week     Comment: occ glass on wine  . Drug use: No  . Sexual activity: No   Other Topics Concern  . Not on file   Social History Narrative   Widow    limited exercise.     Family History:  The patient's family history includes Alcohol abuse in her father; Allergies in her other; Arthritis in her mother and sister; Breast cancer in her mother; Cancer in her brother; Clotting disorder  in her mother; Diabetes in her mother and sister; Hypertension in her mother; Multiple sclerosis in her sister; Stroke in her mother.   ROS:   Please see the history of present illness.    ROS All other systems reviewed and are negative.   PHYSICAL EXAM:   VS:  BP 140/62 (BP Location: Right Arm, Patient Position: Sitting)   Pulse 85   Ht 5\' 4"  (1.626 m)   Wt 226 lb (102.5 kg)   BMI 38.79 kg/m     General: Alert, oriented x3, no distress, moderate to severely obese Head: no evidence of trauma, PERRL, EOMI, no exophtalmos or lid lag, no myxedema, no xanthelasma; normal ears, nose and oropharynx Neck: normal jugular venous pulsations and no hepatojugular reflux; brisk carotid pulses without delay and no carotid bruits Chest: clear to auscultation, no signs of consolidation by percussion or palpation, normal fremitus, symmetrical and full respiratory excursions Cardiovascular: normal position and quality of the apical impulse, regular rhythm, normal first and second heart sounds, no murmurs, rubs or gallops Abdomen: no tenderness or distention, no masses by palpation, no abnormal pulsatility or arterial bruits, normal bowel sounds, no hepatosplenomegaly Extremities: no clubbing, cyanosis or edema; 2+ radial, ulnar and brachial pulses bilaterally; 2+ right femoral, posterior tibial and dorsalis pedis pulses; 2+ left femoral, posterior tibial and dorsalis pedis pulses; no subclavian or  femoral bruits Neurological: grossly nonfocal Psych: Normal mood and affect   Wt Readings from Last 3 Encounters:  04/05/17 226 lb (102.5 kg)  04/04/17 226 lb 8 oz (102.7 kg)  03/25/17 223 lb (101.2 kg)      Studies/Labs Reviewed:   EKG:  EKG is ordered today.  It is unchanged from previous tracings and shows normal sinus rhythm, a single PAC, left ventricular hypertrophy only by voltage criteria. There are no repolarization abnormalities. QTC 430 ms.  Recent Labs: 02/05/2017: Hemoglobin 14.0; Platelets 327.0; TSH 0.01 03/25/2017: ALT 8; BUN 14; Creatinine, Ser 1.10; Potassium 4.4; Sodium 137   Lipid Panel    Component Value Date/Time   CHOL 132 03/25/2017 1206   TRIG 58.0 03/25/2017 1206   HDL 46.50 03/25/2017 1206   CHOLHDL 3 03/25/2017 1206   VLDL 11.6 03/25/2017 1206   LDLCALC 74 03/25/2017 1206     ASSESSMENT:    1. Paroxysmal atrial fibrillation with rapid ventricular response (HCC)   2. Essential hypertension, benign   3. History of pulmonary embolism   4. Pulmonary hypertension ([place])   5. Diabetes mellitus due to underlying condition with diabetic neuropathy, without long-term current use of insulin ([place])   6. Dyslipidemia   7. Class 2 severe obesity due to excess calories with serious comorbidity and body mass index (BMI) of 38.0 to 38.9 in adult ([place])   8. Long term current use of anticoagulant      PLAN:  In order of problems listed above:  1. AFib: No clinically evident episodes recently. CHADSVasc 4 (age, gender, HTN, DM). 2. HTN: Fair control. Avoid beta blockers due to reactive airway disease. 3. Hx DVT/PE conflicting results on previous hypercoagulable workup, but this is probably a moot point since she needs to remain on anticoagulation for atrial fibrillation anyway. 4. PAH: This may be related to previous thromboembolic events, as well as obesity, but cannot exclude a component of diastolic left ventricular dysfunction  5. DM: excellent control,  last hemoglobin A1c 5.9% a week ago; congratulated on losing weight. 6. HLP: All lipid parameters at target. 7. Obesity: Still needs to work hard  on losing weight; empagliflozin helping. 8. Xarelto: . She is on appropriate anticoagulation, both for atrial fibrillation and for venous thromboembolic disease and moderate pulmonary hypertension.   Medication Adjustments/Labs and Tests Ordered: Current medicines are reviewed at length with the patient today.  Concerns regarding medicines are outlined above.  Medication changes, Labs and Tests ordered today are listed in the Patient Instructions below: Patient Instructions  Dr Sallyanne Kuster recommends that you schedule a follow-up appointment in 12 months. You will receive a reminder letter in the mail two months in advance. If you don't receive a letter, please call our office to schedule the follow-up appointment.  If you need a refill on your cardiac medications before your next appointment, please call your pharmacy.    Signed, Sanda Klein, MD  04/06/2017 5:31 PM    [address], [place], [place]  [address] Phone: [phone]; Fax: [phone]

## 2017-04-06 ENCOUNTER — Encounter: Payer: Self-pay | Admitting: Cardiovascular Disease

## 2017-04-08 DIAGNOSIS — J4550 Severe persistent asthma, uncomplicated: Secondary | ICD-10-CM

## 2017-04-08 DIAGNOSIS — J3089 Other allergic rhinitis: Secondary | ICD-10-CM | POA: Diagnosis not present

## 2017-04-08 DIAGNOSIS — I272 Pulmonary hypertension, unspecified: Secondary | ICD-10-CM | POA: Diagnosis not present

## 2017-04-08 DIAGNOSIS — I519 Heart disease, unspecified: Secondary | ICD-10-CM | POA: Diagnosis not present

## 2017-04-08 DIAGNOSIS — K219 Gastro-esophageal reflux disease without esophagitis: Secondary | ICD-10-CM | POA: Diagnosis not present

## 2017-04-08 DIAGNOSIS — M545 Low back pain: Secondary | ICD-10-CM | POA: Diagnosis not present

## 2017-04-08 DIAGNOSIS — M546 Pain in thoracic spine: Secondary | ICD-10-CM | POA: Diagnosis not present

## 2017-04-09 ENCOUNTER — Encounter: Payer: Self-pay | Admitting: Allergy and Immunology

## 2017-04-09 ENCOUNTER — Ambulatory Visit (INDEPENDENT_AMBULATORY_CARE_PROVIDER_SITE_OTHER): Payer: Medicare Other | Admitting: Allergy and Immunology

## 2017-04-09 ENCOUNTER — Ambulatory Visit: Payer: Self-pay

## 2017-04-09 VITALS — BP 132/70 | HR 92 | Resp 18

## 2017-04-09 DIAGNOSIS — I272 Pulmonary hypertension, unspecified: Secondary | ICD-10-CM | POA: Diagnosis not present

## 2017-04-09 DIAGNOSIS — J4550 Severe persistent asthma, uncomplicated: Secondary | ICD-10-CM | POA: Diagnosis not present

## 2017-04-09 DIAGNOSIS — I5189 Other ill-defined heart diseases: Secondary | ICD-10-CM

## 2017-04-09 DIAGNOSIS — J3089 Other allergic rhinitis: Secondary | ICD-10-CM

## 2017-04-09 DIAGNOSIS — K219 Gastro-esophageal reflux disease without esophagitis: Secondary | ICD-10-CM

## 2017-04-09 DIAGNOSIS — I519 Heart disease, unspecified: Secondary | ICD-10-CM

## 2017-04-09 NOTE — Patient Instructions (Addendum)
  1. Continue Advair 230 - 2 inhalations twice a day  2. Continue Flovent 220 2 inhalations twice a day added to Advair during "FLARE UP"  3. Continue Spiriva 2.5 respimat two inhalations one time per day.  4. Continue nasal azelastine two sprays each nostril 1-2 times per day if needed  5. Continue pantoprazole 40mg  in the AM and Ranitidine 300mg  in the PM  6. Continue Xolair and Epi-Pen  7. Continue Montelukast 10mg  one tablet one time per day.  8. Use Provental HFA or albuterol nebulization if needed.   9. For this most recent episode:   A. Complete prednisone taper from orthopedic doctor   B. Bactroban ointment applied inside nose 3 times per day for the next 10 days  C. nasal saline multiple times a day  10. Return to clinic in December 2018 or earlier if there is a problem.

## 2017-04-09 NOTE — Progress Notes (Signed)
Follow-up Note  Referring Provider: [name], MD Primary Provider: [name], MD Date of Office Visit: 04/09/2017  Subjective:   [name] (DOB: [birth year]) is a 71 y.o. female who returns to the [facility] [place] on 04/09/2017 in re-evaluation of the following:  HPI: [name] returns to this clinic in reevaluation of her asthma and allergic rhinitis and history of pulmonary hypertension and diastolic dysfunction and LPR. Her last visit to this clinic was 02/19/2017 at which point in time she appeared to have a viral respiratory tract infection which we treated conservatively.  She does not believe that she has resolved her upper airway symptoms since that last visit. She has clear rhinorrhea and nasal congestion and some fullness of her nose. She also some postnasal drip and some throat clearing. She has also developed some issues with coughing and wheezing but has not had to use her short-acting bronchodilator.  She does not have any ugly nasal discharge or bed headaches or recurrent fevers or chest pain or sputum production.  Her reflux is under good control at this point.  Recently she has been diagnosed with osteoarthritis of her back after seeing her orthopedic surgeon who obtained a back x-ray. She was started on prednisone for this condition with her first dose delivered this morning.  Allergies as of 04/09/2017      Reactions   Lyrica [pregabalin] Other (See Comments)   Blurred vision.   Penicillins Swelling   Has patient had a PCN reaction causing immediate rash, facial/tongue/throat swelling, SOB or lightheadedness with hypotension patient had a PCN reaction causing severe rash involving mucus membranes or skin necrosis: [id number] Has patient had a PCN reaction that required hospitalization/No Has patient had a PCN reaction occurring within the last 10 years: No If all of the above answers are "NO", then may proceed with Cephalosporin use.   Phenytoin Sodium Extended Swelling   Swelling    Vicodin [hydrocodone-acetaminophen] Nausea And Vomiting      Medication List      ADVAIR HFA 132-44 MCG/ACT inhaler Generic drug:  fluticasone-salmeterol INHALE 2 PUFFS BY MOUTH TWICE DAILY   albuterol 108 (90 Base) MCG/ACT inhaler Commonly known as:  PROVENTIL HFA Inhale 2 puffs into the lungs every 6 (six) hours as needed for wheezing or shortness of breath.   ALIGN 4 MG Caps Take 1 capsule by mouth daily.   azelastine 0.1 % nasal spray Commonly known as:  ASTELIN USE 1-2 SPRAYS IN EACH NOSTRIL TWICE DAILY AS NEEDED   Biotin 1 MG Caps Take 1 mg by mouth as needed (muscles).   cyclobenzaprine 10 MG tablet Commonly known as:  FLEXERIL Take 1 tablet (10 mg total) by mouth 2 (two) times daily as needed for muscle spasms.   DAIRY-RELIEF PO Take 1 capsule by mouth daily as needed (stomach upset).   diltiazem 120 MG 24 hr capsule Commonly known as:  CARTIA XT Take 1 capsule (120 mg total) by mouth daily. KEEP OV.   DULoxetine 30 MG capsule Commonly known as:  CYMBALTA Take 30 mg by mouth daily.   EPINEPHrine 0.3 mg/0.3 mL Soaj injection Commonly known as:  EPI-PEN USE AS DIRECTED FOR LIFE THREATENING ALLERGIC REACTIONS   fluticasone 220 MCG/ACT inhaler Commonly known as:  FLOVENT HFA Inhale 2 puffs into the lungs 2 (two) times daily as needed.   furosemide 40 MG tablet Commonly known as:  LASIX TAKE 1 TABLET(40 MG) BY MOUTH DAILY   gabapentin 300 MG  capsule Commonly known as:  NEURONTIN Take 600 mg by mouth 3 (three) times daily.   GLYXAMBI 10-5 MG Tabs Generic drug:  Empagliflozin-Linagliptin Take 1 tablet by mouth daily.   irbesartan 150 MG tablet Commonly known as:  AVAPRO Take 1 tablet (150 mg total) by mouth daily.   levalbuterol 1.25 MG/3ML nebulizer solution Commonly known as:  XOPENEX Take 1.25 mg by nebulization every 4 (four) hours as needed for wheezing.   Magnesium 250 MG Tabs Take 1 tablet  by mouth daily.   montelukast 10 MG tablet Commonly known as:  SINGULAIR TAKE 1 TABLET BY MOUTH EVERY DAY   nystatin cream Commonly known as:  MYCOSTATIN Apply 1 application topically 2 (two) times daily. Continue 48 hours after rash resolved.   OMEGA 3-6-9 COMPLEX PO Take 2 capsules by mouth daily. Reported on 1/61/0960   [facility] DELICA LANCETS 45W Misc as needed (blood check).   ONETOUCH VERIO test strip Generic drug:  glucose blood 1 each by Other route as needed (blood monitoring).   pantoprazole 40 MG tablet Commonly known as:  PROTONIX TAKE 1 TABLET BY MOUTH EVERY DAY   PATADAY 0.2 % Soln Generic drug:  Olopatadine HCl INT 1 GTT IN OU QD PRN   polyethylene glycol packet Commonly known as:  MIRALAX / GLYCOLAX Take 17 g by mouth daily.   predniSONE 10 MG (21) Tbpk tablet Commonly known as:  STERAPRED UNI-PAK 21 TAB TK UTD   rivaroxaban 20 MG Tabs tablet Commonly known as:  XARELTO Take 1 tablet (20 mg total) by mouth daily with supper.   SPIRIVA RESPIMAT 1.25 MCG/ACT Aers Generic drug:  Tiotropium Bromide Monohydrate INHALE 2 PUFFS BY MOUTH EVERY DAY   SUMAtriptan 100 MG tablet Commonly known as:  IMITREX Take 100 mg x 1 may, repeat in 2 hour for migraine.   traMADol 50 MG tablet Commonly known as:  ULTRAM Take 1 tablet (50 mg total) by mouth every 8 (eight) hours as needed.       Past Medical History:  Diagnosis Date  . Allergic rhinitis   . Allergy   . Arthritis   . Asthma   . Chronic headache   . Diabetes mellitus   . DVT (deep venous thrombosis) ([place])   . Dyspnea   . Heart murmur   . Hypertension   . Pulmonary embolism ([place])   . Pulmonary hypertension ([place])   . Seizures ([place])     Past Surgical History:  Procedure Laterality Date  . ABDOMINAL HYSTERECTOMY     partial, has ovaries  . CARDIAC CATHETERIZATION  12/28/2010   Mod. pulmonary hypertension, normal coronary arteries  . DOPPLER ECHOCARDIOGRAPHY  10/08/2011   EF=>55%,mild  asymmetric LVH, mod. TR, mod. PH, mild to mod LA dilatation  . KNEE ARTHROSCOPY Left   . KNEE SURGERY    . Nuclear Stress Test  05/20/2006   No ischemia  . PARTIAL HYSTERECTOMY    . PLANTAR FASCIA SURGERY    . TONSILLECTOMY      Review of systems negative except as noted in HPI / PMHx or noted below:  Review of Systems  Constitutional: Negative.   HENT: Negative.   Eyes: Negative.   Respiratory: Negative.   Cardiovascular: Negative.   Gastrointestinal: Negative.   Genitourinary: Negative.   Musculoskeletal: Negative.   Skin: Negative.   Neurological: Negative.   Endo/Heme/Allergies: Negative.   Psychiatric/Behavioral: Negative.      Objective:   Vitals:   04/09/17 1136  BP: 132/70  Pulse: 92  Resp: 18          Physical Exam  Constitutional: She is well-developed, well-nourished, and in no distress.  HENT:  Head: Normocephalic.  Right Ear: Tympanic membrane, external ear and ear canal normal.  Left Ear: Tympanic membrane, external ear and ear canal normal.  Nose: Nose normal. No mucosal edema (erythematous) or rhinorrhea.  Mouth/Throat: Uvula is midline, oropharynx is clear and moist and mucous membranes are normal. No oropharyngeal exudate.  Eyes: Conjunctivae are normal.  Neck: Trachea normal. No tracheal tenderness present. No tracheal deviation present. No thyromegaly present.  Cardiovascular: Normal rate, regular rhythm, S1 normal, S2 normal and normal heart sounds.   No murmur heard. Pulmonary/Chest: No stridor. No respiratory distress. She has wheezes (expiratory wheezes posterior lung fields left greater than right). She has no rales.  Musculoskeletal: She exhibits no edema.  Lymphadenopathy:       Head (right side): No tonsillar adenopathy present.       Head (left side): No tonsillar adenopathy present.    She has no cervical adenopathy.  Neurological: She is alert. Gait normal.  Skin: No rash noted. She is not diaphoretic. No erythema. Nails show no  clubbing.  Psychiatric: Mood and affect normal.    Diagnostics:    Spirometry was performed and demonstrated an FEV1 of 1.59 at 82 % of predicted.  The patient had an Asthma Control Test with the following results: ACT Total Score: 24.    Assessment and Plan:   1. Asthma, severe persistent, well-controlled   2. Pulmonary hypertension ([place])   3. Diastolic dysfunction   4. Other allergic rhinitis   5. LPRD (laryngopharyngeal reflux disease)     1. Continue Advair 230 - 2 inhalations twice a day  2. Continue Flovent 220 2 inhalations twice a day added to Advair during "FLARE UP"  3. Continue Spiriva 2.5 respimat two inhalations one time per day.  4. Continue nasal azelastine two sprays each nostril 1-2 times per day if needed  5. Continue pantoprazole 40mg  in the AM and Ranitidine 300mg  in the PM  6. Continue Xolair and Epi-Pen  7. Continue Montelukast 10mg  one tablet one time per day.  8. Use Provental HFA or albuterol nebulization if needed.   9. For this most recent episode:   A. Complete prednisone taper from orthopedic doctor   B. Bactroban ointment applied inside nose 3 times per day for the next 10 days  C. nasal saline multiple times a day  10. Return to clinic in December 2018 or earlier if there is a problem.  Besides for the chronic therapy that Jalaya will use directed at respiratory tract inflammation and reflux I am going to have her use a topical antibacterial agent in her nasal mucosa assuming that there might be some form of infectious rhinitis contributing to some of her symptoms. Of course, the systemic steroids administered by her orthopedist will address the inflammatory component of this issue. If she does well I will see her back in this clinic in December 2018 or earlier if there is a problem.  Allena Katz, MD Allergy / Immunology [place]

## 2017-04-10 ENCOUNTER — Telehealth: Payer: Self-pay | Admitting: Allergy and Immunology

## 2017-04-10 DIAGNOSIS — J3089 Other allergic rhinitis: Secondary | ICD-10-CM

## 2017-04-10 DIAGNOSIS — J0100 Acute maxillary sinusitis, unspecified: Secondary | ICD-10-CM

## 2017-04-10 MED ORDER — MUPIROCIN CALCIUM 2 % NA OINT: TOPICAL_OINTMENT | NASAL | 0 refills | Status: DC

## 2017-04-10 MED ORDER — AZELASTINE HCL 0.1 % NA SOLN: NASAL | 5 refills | Status: DC

## 2017-04-10 NOTE — Telephone Encounter (Signed)
Pt called and said that her nasel spray was not called into [facility] [phone].

## 2017-04-10 NOTE — Telephone Encounter (Signed)
rx sent in for azelastine and Bactroban sent in as well

## 2017-04-12 ENCOUNTER — Telehealth: Payer: Self-pay | Admitting: Cardiovascular Disease

## 2017-04-12 NOTE — Telephone Encounter (Signed)
The patient stated that she was currently at a funeral. She started experiencing chest pain and lower back pain. EMS was called. Per the patient, EMS advised that they did not think it was cardiac related and that she should go home and rest. If the pain got worse then she should call 911 again.  The patient stated that she chewed a Tum and her pain went from a 10 to a 2. An appointment has been made for Monday 10/1 with [name], PA. She has been advised to call 911 if the pain does get worse and she verbalized her understanding.

## 2017-04-12 NOTE — Telephone Encounter (Signed)
Pt c/o of Chest Pain: STAT if CP now or developed within 24 hours  1. Are you having CP right now? yes  2. Are you experiencing any other symptoms (ex. SOB, nausea, vomiting, sweating)? no  3. How long have you been experiencing CP? Last half an hour 4. Is your CP continuous or coming and going? Coming and going 5. Have you taken Nitroglycerin? no?

## 2017-04-14 NOTE — Assessment & Plan Note (Signed)
Stable control on gabapentin.  

## 2017-04-14 NOTE — Assessment & Plan Note (Signed)
Followed by Dr. Neldon Mc. Stable.

## 2017-04-15 ENCOUNTER — Ambulatory Visit: Payer: Medicare Other | Admitting: Physician Assistant

## 2017-04-15 ENCOUNTER — Ambulatory Visit (INDEPENDENT_AMBULATORY_CARE_PROVIDER_SITE_OTHER): Payer: Medicare Other | Admitting: Physician Assistant

## 2017-04-15 ENCOUNTER — Encounter: Payer: Self-pay | Admitting: Physician Assistant

## 2017-04-15 ENCOUNTER — Other Ambulatory Visit: Payer: Self-pay | Admitting: Allergy and Immunology

## 2017-04-15 VITALS — BP 130/80 | HR 72 | Ht 64.0 in | Wt 231.0 lb

## 2017-04-15 DIAGNOSIS — R0789 Other chest pain: Secondary | ICD-10-CM

## 2017-04-15 DIAGNOSIS — I10 Essential (primary) hypertension: Secondary | ICD-10-CM | POA: Diagnosis not present

## 2017-04-15 DIAGNOSIS — I825Y9 Chronic embolism and thrombosis of unspecified deep veins of unspecified proximal lower extremity: Secondary | ICD-10-CM | POA: Diagnosis not present

## 2017-04-15 DIAGNOSIS — I480 Paroxysmal atrial fibrillation: Secondary | ICD-10-CM | POA: Diagnosis not present

## 2017-04-15 DIAGNOSIS — M545 Low back pain: Secondary | ICD-10-CM | POA: Diagnosis not present

## 2017-04-15 DIAGNOSIS — M546 Pain in thoracic spine: Secondary | ICD-10-CM | POA: Diagnosis not present

## 2017-04-15 NOTE — Progress Notes (Signed)
Cardiology Office Note    Date:  04/17/2017   ID:  Alicia, Price 01/23/1946, MRN [id number]  PCP:  [name], MD  Cardiologist:  Dr. Sallyanne Kuster  Chief Complaint  Patient presents with  . Follow-up    chest pain since friday    History of Present Illness:  [name] is a 71 y.o. female with recent DVT/PE, PAF, moderate pulmonary HTN, chronic reactive airway disease and DJD. She had a cardiac catheterization on 12/29/2010 by Dr. Claiborne Billings which showed cardiac output 5.9, cardiac index 2.6, EF greater than 65%, normal left main, normal LAD, normal left circumflex and normal RCA. She has not had any recurrent atrial fibrillation for over a year. She is under rate control with diltiazem. She had previously equivocal workup for hypercoagulable conditions (lupus anticoagulant positive on one of 2 separate assays, protein S activity decreased with normal total protein S level). Last echocardiogram obtained on 04/20/2015 showed EF 65-70%, grade 2 DD, PA peak pressure 46 mmHg.  She says last Friday she was at a funeral when she experienced chest pain and back pain. EMS arrived however did not think was cardiac related, she went home to rest and the symptoms eventually went away. Her symptoms improved after Tums. Since then, she has been ambulating on daily basis without any exertional chest discomfort. Symptom has not recurred since. Given the atypical nature of the chest discomfort, I do not recommend any further workup at this time. Otherwise she does not have any lower extremity edema, orthopnea or PND.   Past Medical History:  Diagnosis Date  . Allergic rhinitis   . Allergy   . Arthritis   . Asthma   . Chronic headache   . Diabetes mellitus   . DVT (deep venous thrombosis) ([place])   . Dyspnea   . Heart murmur   . Hypertension   . Pulmonary embolism ([place])   . Pulmonary hypertension ([place])   . Seizures ([place])     Past Surgical History:  Procedure Laterality Date  . ABDOMINAL  HYSTERECTOMY     partial, has ovaries  . CARDIAC CATHETERIZATION  12/28/2010   Mod. pulmonary hypertension, normal coronary arteries  . DOPPLER ECHOCARDIOGRAPHY  10/08/2011   EF=>55%,mild asymmetric LVH, mod. TR, mod. PH, mild to mod LA dilatation  . KNEE ARTHROSCOPY Left   . KNEE SURGERY    . Nuclear Stress Test  05/20/2006   No ischemia  . PARTIAL HYSTERECTOMY    . PLANTAR FASCIA SURGERY    . TONSILLECTOMY      Current Medications: Outpatient Medications Prior to Visit  Medication Sig Dispense Refill  . ADVAIR HFA 230-21 MCG/ACT inhaler INHALE 2 PUFFS BY MOUTH TWICE DAILY 12 g 4  . albuterol (PROVENTIL HFA) 108 (90 BASE) MCG/ACT inhaler Inhale 2 puffs into the lungs every 6 (six) hours as needed for wheezing or shortness of breath. 1 Inhaler 0  . azelastine (ASTELIN) 0.1 % nasal spray USE 1-2 SPRAYS IN EACH NOSTRIL TWICE DAILY AS NEEDED 30 mL 5  . Biotin 1 MG CAPS Take 1 mg by mouth as needed (muscles).     . cyclobenzaprine (FLEXERIL) 10 MG tablet Take 1 tablet (10 mg total) by mouth 2 (two) times daily as needed for muscle spasms. 15 tablet 0  . diltiazem (CARTIA XT) 120 MG 24 hr capsule Take 1 capsule (120 mg total) by mouth daily. KEEP OV. 30 capsule 2  . DULoxetine (CYMBALTA) 30 MG capsule Take 30 mg by mouth  daily.    . Empagliflozin-Linagliptin (GLYXAMBI) 10-5 MG TABS Take 1 tablet by mouth daily.    Marland Kitchen EPINEPHrine 0.3 mg/0.3 mL IJ SOAJ injection USE AS DIRECTED FOR LIFE THREATENING ALLERGIC REACTIONS 2 Device 2  . fluticasone (FLOVENT HFA) 220 MCG/ACT inhaler Inhale 2 puffs into the lungs 2 (two) times daily as needed. 1 Inhaler 5  . furosemide (LASIX) 40 MG tablet TAKE 1 TABLET(40 MG) BY MOUTH DAILY 30 tablet 3  . gabapentin (NEURONTIN) 300 MG capsule Take 600 mg by mouth 3 (three) times daily.  6  . irbesartan (AVAPRO) 150 MG tablet Take 1 tablet (150 mg total) by mouth daily. 30 tablet 5  . Lactase (DAIRY-RELIEF PO) Take 1 capsule by mouth daily as needed (stomach upset).       Marland Kitchen levalbuterol (XOPENEX) 1.25 MG/3ML nebulizer solution Take 1.25 mg by nebulization every 4 (four) hours as needed for wheezing. 150 mL 0  . Magnesium 250 MG TABS Take 1 tablet by mouth daily.    . montelukast (SINGULAIR) 10 MG tablet TAKE 1 TABLET BY MOUTH EVERY DAY 30 tablet 5  . mupirocin nasal ointment (BACTROBAN NASAL) 2 % Apply to nostril three times daily for 10 days After application, press sides of nose together and gently massage. 10 g 0  . nystatin cream (MYCOSTATIN) Apply 1 application topically 2 (two) times daily. Continue 48 hours after rash resolved. 30 g 1  . Omega 3-6-9 Fatty Acids (OMEGA 3-6-9 COMPLEX PO) Take 2 capsules by mouth daily. Reported on 08/02/2015    . ONETOUCH DELICA LANCETS 40J MISC as needed (blood check).     [name] VERIO test strip 1 each by Other route as needed (blood monitoring).     . pantoprazole (PROTONIX) 40 MG tablet TAKE 1 TABLET BY MOUTH EVERY DAY 90 tablet 4  . PATADAY 0.2 % SOLN INT 1 GTT IN OU QD PRN  4  . polyethylene glycol (MIRALAX / GLYCOLAX) packet Take 17 g by mouth daily.    . predniSONE (STERAPRED UNI-PAK 21 TAB) 10 MG (21) TBPK tablet TK UTD  0  . Probiotic Product (ALIGN) 4 MG CAPS Take 1 capsule by mouth daily.    . rivaroxaban (XARELTO) 20 MG TABS tablet Take 1 tablet (20 mg total) by mouth daily with supper. 30 tablet 5  . SPIRIVA RESPIMAT 1.25 MCG/ACT AERS INHALE 2 PUFFS BY MOUTH EVERY DAY 4 g 2  . SUMAtriptan (IMITREX) 100 MG tablet Take 100 mg x 1 may, repeat in 2 hour for migraine. 10 tablet 0  . traMADol (ULTRAM) 50 MG tablet Take 1 tablet (50 mg total) by mouth every 8 (eight) hours as needed. 30 tablet 0   Facility-Administered Medications Prior to Visit  Medication Dose Route Frequency Provider Last Rate Last Dose  . omalizumab Arvid Right) injection 300 mg  300 mg Subcutaneous Q28 days [name], MD   300 mg at 04/09/17 1208     Allergies:   Lyrica [pregabalin]; Penicillins; Phenytoin sodium extended; and Vicodin  [hydrocodone-acetaminophen]   Social History   Social History  . Marital status: Widowed    Spouse name: N/A  . Number of children: Y  . Years of education: N/A   Occupational History  . retired Geologist, engineering.     Social History Main Topics  . Smoking status: Never Smoker  . Smokeless tobacco: Never Used  . Alcohol use 0.0 oz/week     Comment: occ glass on wine  . Drug use: No  .  Sexual activity: No   Other Topics Concern  . None   Social History Narrative   Widow    limited exercise.     Family History:  The patient's family history includes Alcohol abuse in her father; Allergies in her other; Arthritis in her mother and sister; Breast cancer in her mother; Cancer in her brother; Clotting disorder in her mother; Diabetes in her mother and sister; Hypertension in her mother; Multiple sclerosis in her sister; Stroke in her mother.   ROS:   Please see the history of present illness.    ROS All other systems reviewed and are negative.   PHYSICAL EXAM:   VS:  BP 130/80   Pulse 72   Ht 5\' 4"  (1.626 m)   Wt 231 lb (104.8 kg)   BMI 39.65 kg/m    GEN: Well nourished, well developed, in no acute distress  HEENT: normal  Neck: no JVD, carotid bruits, or masses Cardiac: RRR; no murmurs, rubs, or gallops,no edema  Respiratory:  clear to auscultation bilaterally, normal work of breathing GI: soft, nontender, nondistended, + BS MS: no deformity or atrophy  Skin: warm and dry, no rash Neuro:  Alert and Oriented x 3, Strength and sensation are intact Psych: euthymic mood, full affect  Wt Readings from Last 3 Encounters:  04/15/17 231 lb (104.8 kg)  04/05/17 226 lb (102.5 kg)  04/04/17 226 lb 8 oz (102.7 kg)      Studies/Labs Reviewed:   EKG:  EKG is ordered today.  The ekg ordered today demonstrates Normal sinus rhythm without significant ST-T wave changes.  Recent Labs: 02/05/2017: Hemoglobin 14.0; Platelets 327.0; TSH 0.01 03/25/2017: ALT 8; BUN 14; Creatinine, Ser  1.10; Potassium 4.4; Sodium 137   Lipid Panel    Component Value Date/Time   CHOL 132 03/25/2017 1206   TRIG 58.0 03/25/2017 1206   HDL 46.50 03/25/2017 1206   CHOLHDL 3 03/25/2017 1206   VLDL 11.6 03/25/2017 1206   LDLCALC 74 03/25/2017 1206    Additional studies/ records that were reviewed today include:               CARDIAC CATHETERIZATION  INDICATIONS:  [name] is a 71 year old African American female who has a history of moderately severe pulmonary hypertension for at least 5 years.  Remotely, she apparently was diagnosed as having a lupus anticoagulant in 2004 and a history of pulmonary embolism.  She had been on Coumadin therapy remotely.  There is also history of migraines. Recently the patient has experienced increasing shortness of breath. She recently was admitted with chest pain.  A chest CT was negative for recurrent PE.  Prior to consideration for a potential long-term coumadinization and potential need for treatment of her moderately severe pulmonary hypertension, she is now referred for right and left heart catheterization in light of her recent chest pain, increasing shortness of breath symptomatology.  HEMODYNAMIC DATA:  Right atrial pressure mean 16, A-wave 20, RV pressure 54/15, PA pressure 50-54/20, pulmonary capillary wedge pressure 16-20.  Central aortic pressure 155/79.  Left ventricular pressure 155/17.  O2 saturation in the pulmonary artery was 63% and in the central aorta was 92%.  Cardiac output by the thermodilution method was 4.8, by the Fick method was 5.9 liters per minute.  Cardiac index was 2.2 and 2.6 liters per minute per meter squared, respectively.  Biplane cine left ventriculography revealed hyperdynamic LV function with an ejection fraction of at least 65%.  There were no focal segmental wall  motion abnormalities.  There is no evidence for significant mitral regurgitation.  Distal aortography revealed patent  renal arteries without significant aortoiliac disease.  ANGIOGRAPHIC DATA:  The left main coronary artery was angiographically normal and bifurcated into an LAD and left circumflex system.  The LAD was angiographically normal and gave rise to several septal perforating arteries and 1 major diagonal vessel.  The circumflex vessel was angiographically normal, gave rise to 3 marginal vessels.  The right coronary artery was angiographically normal and gave rise to a large PDA system and small posterolateral vessel.  IMPRESSION: 1. Normal hyperdynamic left ventricular function. 2. Elevation of right heart pressures with moderate pulmonary     hypertension with right ventricle systolic and PA pressures ranging     now at 16-10 mm systolically. 3. Normal coronary arteries. 4. No evidence for significant aortoiliac disease or renovascular     hypertension.    Echo 04/20/2015 LV EF: 65% -   70%  Study Conclusions  - Left ventricle: The cavity size was normal. Wall thickness was   increased in a pattern of mild LVH. Systolic function was   vigorous. The estimated ejection fraction was in the range of 65%   to 70%. Features are consistent with a pseudonormal left   ventricular filling pattern, with concomitant abnormal relaxation   and increased filling pressure (grade 2 diastolic dysfunction). - Aortic valve: There was no stenosis. - Mitral valve: Mildly calcified annulus. There was trivial   regurgitation. - Left atrium: The atrium was mildly dilated. - Right ventricle: The cavity size was normal. Systolic function   was normal. - Right atrium: The atrium was mildly dilated. - Tricuspid valve: Peak RV-RA gradient (S): 43 mm Hg. - Pulmonary arteries: PA peak pressure: 46 mm Hg (S). - Inferior vena cava: The vessel was normal in size. The   respirophasic diameter changes were in the normal range (= 50%),   consistent with normal central venous  pressure.  Impressions:  - Normal LV size with vigorous systolic function, EF 96-04%. Mild   LV hypertrophy. Normal RV size and systolic function. Mild   pulmonary hypertension.   ASSESSMENT:    1. Atypical chest pain   2. Essential hypertension, benign   3. PAF (paroxysmal atrial fibrillation) ([place])   4. Chronic deep vein thrombosis (DVT) of proximal vein of lower extremity, unspecified laterality ([facility])      PLAN:  In order of problems listed above:  1. Atypical chest pain: Occurred at rest last Friday, has not recurred since. She has been able to ambulate on daily basis without exertional chest discomfort. Her last cardiac catheterization was in 2012 which showed clean coronaries, probability of ACS very low in this case. Will not recommend any further workup, continue observation, if worsens may consider stress testing at a later time. She has been compliant with his Xarelto, her symptom is not consistent with DVT/PE.  2. Hypertension: Blood pressure well controlled  3. PAF: Compliant with Xarelto. Maintaining sinus rhythm. Continue rate control    Medication Adjustments/Labs and Tests Ordered: Current medicines are reviewed at length with the patient today.  Concerns regarding medicines are outlined above.  Medication changes, Labs and Tests ordered today are listed in the Patient Instructions below. Patient Instructions  Medication Instructions:   No changes to current medications.  Labwork:   none  Testing/Procedures:  none  Follow-Up:  Your physician recommends that you schedule a follow-up appointment in: 1 year with Dr. Sallyanne Kuster  If you need a  refill on your cardiac medications before your next appointment, please call your pharmacy.      Hilbert Corrigan, [place]  04/17/2017 1:39 AM    [address], [place], [place]  [address] Phone: [phone]; Fax: [phone]

## 2017-04-15 NOTE — Patient Instructions (Signed)
Medication Instructions:   No changes to current medications.  Labwork:   none  Testing/Procedures:  none  Follow-Up:  Your physician recommends that you schedule a follow-up appointment in: 1 year with Dr. Sallyanne Kuster  If you need a refill on your cardiac medications before your next appointment, please call your pharmacy.

## 2017-04-17 ENCOUNTER — Encounter: Payer: Self-pay | Admitting: Physician Assistant

## 2017-04-18 ENCOUNTER — Ambulatory Visit: Payer: Medicare Other | Admitting: Physician Assistant

## 2017-05-06 DIAGNOSIS — J3089 Other allergic rhinitis: Secondary | ICD-10-CM | POA: Diagnosis not present

## 2017-05-06 DIAGNOSIS — M545 Low back pain: Secondary | ICD-10-CM | POA: Diagnosis not present

## 2017-05-06 DIAGNOSIS — I519 Heart disease, unspecified: Secondary | ICD-10-CM | POA: Diagnosis not present

## 2017-05-06 DIAGNOSIS — I272 Pulmonary hypertension, unspecified: Secondary | ICD-10-CM | POA: Diagnosis not present

## 2017-05-06 DIAGNOSIS — M546 Pain in thoracic spine: Secondary | ICD-10-CM | POA: Diagnosis not present

## 2017-05-06 DIAGNOSIS — K219 Gastro-esophageal reflux disease without esophagitis: Secondary | ICD-10-CM | POA: Diagnosis not present

## 2017-05-06 DIAGNOSIS — J4550 Severe persistent asthma, uncomplicated: Secondary | ICD-10-CM | POA: Diagnosis not present

## 2017-05-07 ENCOUNTER — Ambulatory Visit: Payer: Self-pay

## 2017-05-07 ENCOUNTER — Encounter: Payer: Self-pay | Admitting: Allergy and Immunology

## 2017-05-07 ENCOUNTER — Ambulatory Visit (INDEPENDENT_AMBULATORY_CARE_PROVIDER_SITE_OTHER): Payer: Medicare Other | Admitting: Allergy and Immunology

## 2017-05-07 VITALS — BP 140/84 | HR 99 | Resp 17

## 2017-05-07 DIAGNOSIS — I272 Pulmonary hypertension, unspecified: Secondary | ICD-10-CM

## 2017-05-07 DIAGNOSIS — K219 Gastro-esophageal reflux disease without esophagitis: Secondary | ICD-10-CM

## 2017-05-07 DIAGNOSIS — I5189 Other ill-defined heart diseases: Secondary | ICD-10-CM

## 2017-05-07 DIAGNOSIS — J3089 Other allergic rhinitis: Secondary | ICD-10-CM | POA: Diagnosis not present

## 2017-05-07 DIAGNOSIS — J4550 Severe persistent asthma, uncomplicated: Secondary | ICD-10-CM

## 2017-05-07 DIAGNOSIS — I519 Heart disease, unspecified: Secondary | ICD-10-CM | POA: Diagnosis not present

## 2017-05-07 NOTE — Patient Instructions (Addendum)
  1. Continue Advair 230 - 2 inhalations twice a day  2. Continue Flovent 220 2 inhalations twice a day added to Advair during "FLARE UP"  3. Continue Spiriva 2.5 respimat two inhalations one time per day.  4. Continue nasal azelastine two sprays each nostril 1-2 times per day if needed  5. Continue pantoprazole 40mg  in the AM and Ranitidine 300mg  in the PM  6. Continue Xolair and Epi-Pen  7. Continue Montelukast 10mg  one tablet one time per day.  8. Use Provental HFA or albuterol nebulization if needed.   9. For most recent episode:   A. Nasal saline several times per day  B. Mucinex DM two times per day  10. Return to clinic in December 2018 or earlier if there is a problem.

## 2017-05-07 NOTE — Progress Notes (Signed)
Follow-up Note  Referring Provider: [name], MD Primary Provider: [name], MD Date of Office Visit: 05/07/2017  Subjective:   [name] (DOB: [birth year]) is a 71 y.o. female who returns to the [facility] [place] on 05/07/2017 in re-evaluation of the following:  HPI: [name] returns to this clinic in reevaluation of her asthma and allergic rhinitis and history of LPR and pulmonary hypertension and diastolic dysfunction. I last saw her in this clinic 04/09/2017 at which point in time it looked like she had some possible infectious rhinitis which we treated with topical Bactroban successfully.  She was doing quite well up until this Friday when she developed runny nose and sneezing and nasal congestion quickly followed by coughing and wheezing and laryngitis and just feeling bad in general. She missed church on Sunday because she felt so bad and she did have an episode of slightly bloody postnasal drip and a bloody nose on one occasion but that has fortunately cleared. Yesterday she had some improvement and today she is left with persistent nasal congestion and very slight cough.  She did not add in her Flovent to her Advair during the onset of this reaction but she has been using some over-the-counter medications to help with her symptoms which appear to have started working over the course of the past 24 hours or so.  She believes that her reflux is under good control at this point in time.  Allergies as of 05/07/2017      Reactions   Lyrica [pregabalin] Other (See Comments)   Blurred vision.   Penicillins Swelling   Has patient had a PCN reaction causing immediate rash, facial/tongue/throat swelling, SOB or lightheadedness with hypotension patient had a PCN reaction causing severe rash involving mucus membranes or skin necrosis: [id number]} Has patient had a PCN reaction that required hospitalization/No Has patient had a PCN reaction occurring within the  last 10 years: No If all of the above answers are "NO", then may proceed with Cephalosporin use.   Phenytoin Sodium Extended Swelling   Swelling    Vicodin [hydrocodone-acetaminophen] Nausea And Vomiting      Medication List      ADVAIR HFA 119-41 MCG/ACT inhaler Generic drug:  fluticasone-salmeterol INHALE 2 PUFFS BY MOUTH TWICE DAILY   albuterol 108 (90 Base) MCG/ACT inhaler Commonly known as:  PROVENTIL HFA Inhale 2 puffs into the lungs every 6 (six) hours as needed for wheezing or shortness of breath.   ALIGN 4 MG Caps Take 1 capsule by mouth daily.   azelastine 0.1 % nasal spray Commonly known as:  ASTELIN USE 1-2 SPRAYS IN EACH NOSTRIL TWICE DAILY AS NEEDED   Biotin 1 MG Caps Take 1 mg by mouth as needed (muscles).   cyclobenzaprine 10 MG tablet Commonly known as:  FLEXERIL Take 1 tablet (10 mg total) by mouth 2 (two) times daily as needed for muscle spasms.   DAIRY-RELIEF PO Take 1 capsule by mouth daily as needed (stomach upset).   diltiazem 120 MG 24 hr capsule Commonly known as:  CARTIA XT Take 1 capsule (120 mg total) by mouth daily. KEEP OV.   DULoxetine 30 MG capsule Commonly known as:  CYMBALTA Take 30 mg by mouth daily.   EPINEPHrine 0.3 mg/0.3 mL Soaj injection Commonly known as:  EPI-PEN USE AS DIRECTED FOR LIFE THREATENING ALLERGIC REACTIONS   fluticasone 220 MCG/ACT inhaler Commonly known as:  FLOVENT HFA Inhale 2 puffs into the lungs 2 (two) times daily  as needed.   furosemide 40 MG tablet Commonly known as:  LASIX TAKE 1 TABLET(40 MG) BY MOUTH DAILY   gabapentin 300 MG capsule Commonly known as:  NEURONTIN Take 600 mg by mouth 3 (three) times daily.   GLYXAMBI 10-5 MG Tabs Generic drug:  Empagliflozin-Linagliptin Take 1 tablet by mouth daily.   irbesartan 150 MG tablet Commonly known as:  AVAPRO Take 1 tablet (150 mg total) by mouth daily.   levalbuterol 1.25 MG/3ML nebulizer solution Commonly known as:  XOPENEX Take 1.25 mg by  nebulization every 4 (four) hours as needed for wheezing.   Magnesium 250 MG Tabs Take 1 tablet by mouth daily.   montelukast 10 MG tablet Commonly known as:  SINGULAIR TAKE 1 TABLET BY MOUTH EVERY DAY   mupirocin nasal ointment 2 % Commonly known as:  BACTROBAN NASAL Apply to nostril three times daily for 10 days After application, press sides of nose together and gently massage.   nystatin cream Commonly known as:  MYCOSTATIN Apply 1 application topically 2 (two) times daily. Continue 48 hours after rash resolved.   OMEGA 3-6-9 COMPLEX PO Take 2 capsules by mouth daily. Reported on 7/51/0258   [facility] DELICA LANCETS 52D Misc as needed (blood check).   ONETOUCH VERIO test strip Generic drug:  glucose blood 1 each by Other route as needed (blood monitoring).   pantoprazole 40 MG tablet Commonly known as:  PROTONIX TAKE 1 TABLET BY MOUTH EVERY DAY   PATADAY 0.2 % Soln Generic drug:  Olopatadine HCl INT 1 GTT IN OU QD PRN   polyethylene glycol packet Commonly known as:  MIRALAX / GLYCOLAX Take 17 g by mouth daily.   predniSONE 10 MG (21) Tbpk tablet Commonly known as:  STERAPRED UNI-PAK 21 TAB TK UTD   ranitidine 300 MG tablet Commonly known as:  ZANTAC TAKE 1 TABLET BY MOUTH EVERY NIGHT AT BEDTIME   rivaroxaban 20 MG Tabs tablet Commonly known as:  XARELTO Take 1 tablet (20 mg total) by mouth daily with supper.   SPIRIVA RESPIMAT 1.25 MCG/ACT Aers Generic drug:  Tiotropium Bromide Monohydrate INHALE 2 PUFFS BY MOUTH EVERY DAY   SUMAtriptan 100 MG tablet Commonly known as:  IMITREX Take 100 mg x 1 may, repeat in 2 hour for migraine.   traMADol 50 MG tablet Commonly known as:  ULTRAM Take 1 tablet (50 mg total) by mouth every 8 (eight) hours as needed.       Past Medical History:  Diagnosis Date  . Allergic rhinitis   . Allergy   . Arthritis   . Asthma   . Chronic headache   . Diabetes mellitus   . DVT (deep venous thrombosis) ([place])   . Dyspnea    . Heart murmur   . Hypertension   . Pulmonary embolism ([place])   . Pulmonary hypertension ([place])   . Seizures ([place])     Past Surgical History:  Procedure Laterality Date  . ABDOMINAL HYSTERECTOMY     partial, has ovaries  . CARDIAC CATHETERIZATION  12/28/2010   Mod. pulmonary hypertension, normal coronary arteries  . DOPPLER ECHOCARDIOGRAPHY  10/08/2011   EF=>55%,mild asymmetric LVH, mod. TR, mod. PH, mild to mod LA dilatation  . KNEE ARTHROSCOPY Left   . KNEE SURGERY    . Nuclear Stress Test  05/20/2006   No ischemia  . PARTIAL HYSTERECTOMY    . PLANTAR FASCIA SURGERY    . TONSILLECTOMY      Review of systems negative except as noted  in HPI / PMHx or noted below:  Review of Systems  Constitutional: Negative.   HENT: Negative.   Eyes: Negative.   Respiratory: Negative.   Cardiovascular: Negative.   Gastrointestinal: Negative.   Genitourinary: Negative.   Musculoskeletal: Negative.   Skin: Negative.   Neurological: Negative.   Endo/Heme/Allergies: Negative.   Psychiatric/Behavioral: Negative.      Objective:   Vitals:   05/07/17 1136  BP: 140/84  Pulse: 99  Resp: 17  SpO2: 97%          Physical Exam  Constitutional: She is well-developed, well-nourished, and in no distress.  HENT:  Head: Normocephalic.  Right Ear: Tympanic membrane, external ear and ear canal normal.  Left Ear: Tympanic membrane, external ear and ear canal normal.  Nose: Mucosal edema (erythematous) present. No rhinorrhea.  Mouth/Throat: Uvula is midline, oropharynx is clear and moist and mucous membranes are normal. No oropharyngeal exudate.  Eyes: Conjunctivae are normal.  Neck: Trachea normal. No tracheal tenderness present. No tracheal deviation present. No thyromegaly present.  Cardiovascular: Normal rate, regular rhythm, S1 normal, S2 normal and normal heart sounds.   No murmur heard. Pulmonary/Chest: Breath sounds normal. No stridor. No respiratory distress. She has no wheezes. She  has no rales.  Musculoskeletal: She exhibits no edema.  Lymphadenopathy:       Head (right side): No tonsillar adenopathy present.       Head (left side): No tonsillar adenopathy present.    She has no cervical adenopathy.  Neurological: She is alert. Gait normal.  Skin: No rash noted. She is not diaphoretic. No erythema. Nails show no clubbing.  Psychiatric: Mood and affect normal.    Diagnostics:    Spirometry was performed and demonstrated an FEV1 of 1.73 at 94 % of predicted.  The patient had an Asthma Control Test with the following results: ACT Total Score: 16.    Assessment and Plan:   1. Asthma, severe persistent, well-controlled   2. Pulmonary hypertension ([place])   3. Diastolic dysfunction   4. Other allergic rhinitis   5. LPRD (laryngopharyngeal reflux disease)     1. Continue Advair 230 - 2 inhalations twice a day  2. Continue Flovent 220 2 inhalations twice a day added to Advair during "FLARE UP"  3. Continue Spiriva 2.5 respimat two inhalations one time per day.  4. Continue nasal azelastine two sprays each nostril 1-2 times per day if needed  5. Continue pantoprazole 40mg  in the AM and Ranitidine 300mg  in the PM  6. Continue Xolair and Epi-Pen  7. Continue Montelukast 10mg  one tablet one time per day.  8. Use Provental HFA or albuterol nebulization if needed.   9. For most recent episode:   A. Nasal saline several times per day  B. Mucinex DM two times per day  10. Return to clinic in December 2018 or earlier if there is a problem.  It appears as though Makari has developed a viral respiratory tract infection that has already shown some improvement and I'm not going to give her any antibiotics or systemic steroids and she will activate her action plan to prevent significant asthma flare and I made some suggestions regarding some symptomatic medications she can utilize over this next week. If she continues to do okay on her current plan I will see her back  in this clinic in December 2018 or earlier if there is a problem.  Allena Katz, MD Allergy / Immunology [place]

## 2017-05-21 ENCOUNTER — Other Ambulatory Visit: Payer: Self-pay | Admitting: Family Medicine

## 2017-05-21 MED ORDER — FLUCONAZOLE 150 MG PO TABS: 150.0000 mg | ORAL_TABLET | Freq: Once | ORAL | 0 refills | Status: AC

## 2017-05-21 NOTE — Telephone Encounter (Signed)
Copied from [place] #[id number]. Topic: Quick Communication - See Telephone Encounter >> May 21, 2017  2:16 PM Robina Ade, Helene Kelp D wrote: CRM for notification. See Telephone encounter for: 05/21/17. Patient would like a Rx for Fluconazole 200 mg. Pt said this would be a new Rx and if it can be send to [facility].

## 2017-05-21 NOTE — Telephone Encounter (Signed)
Requesting a new Rx

## 2017-05-21 NOTE — Telephone Encounter (Signed)
Pt  Has been taking prednisone and when pt takes prednisone she gets yeast infection; presently pt has vaginal itching with no discharge. [place].last annual 04/04/17.Pt is requesting diflucan 200 mg but on hx med list in past diflucan 150 mg has been ordered.

## 2017-06-03 DIAGNOSIS — L309 Dermatitis, unspecified: Secondary | ICD-10-CM | POA: Diagnosis not present

## 2017-06-04 ENCOUNTER — Ambulatory Visit: Payer: Self-pay

## 2017-06-04 DIAGNOSIS — J4540 Moderate persistent asthma, uncomplicated: Secondary | ICD-10-CM

## 2017-06-05 ENCOUNTER — Ambulatory Visit (INDEPENDENT_AMBULATORY_CARE_PROVIDER_SITE_OTHER): Payer: Medicare Other

## 2017-06-05 DIAGNOSIS — J4540 Moderate persistent asthma, uncomplicated: Secondary | ICD-10-CM

## 2017-06-11 DIAGNOSIS — M1711 Unilateral primary osteoarthritis, right knee: Secondary | ICD-10-CM | POA: Diagnosis not present

## 2017-06-11 DIAGNOSIS — M25561 Pain in right knee: Secondary | ICD-10-CM | POA: Diagnosis not present

## 2017-06-11 DIAGNOSIS — G8929 Other chronic pain: Secondary | ICD-10-CM | POA: Diagnosis not present

## 2017-06-11 DIAGNOSIS — H40013 Open angle with borderline findings, low risk, bilateral: Secondary | ICD-10-CM | POA: Diagnosis not present

## 2017-06-11 DIAGNOSIS — H47323 Drusen of optic disc, bilateral: Secondary | ICD-10-CM | POA: Diagnosis not present

## 2017-06-11 DIAGNOSIS — M25511 Pain in right shoulder: Secondary | ICD-10-CM | POA: Diagnosis not present

## 2017-06-11 DIAGNOSIS — H1013 Acute atopic conjunctivitis, bilateral: Secondary | ICD-10-CM | POA: Diagnosis not present

## 2017-06-18 DIAGNOSIS — E1142 Type 2 diabetes mellitus with diabetic polyneuropathy: Secondary | ICD-10-CM | POA: Diagnosis not present

## 2017-06-25 ENCOUNTER — Ambulatory Visit: Payer: Medicare Other | Admitting: Allergy and Immunology

## 2017-06-30 ENCOUNTER — Other Ambulatory Visit: Payer: Self-pay | Admitting: Allergy and Immunology

## 2017-07-01 DIAGNOSIS — K219 Gastro-esophageal reflux disease without esophagitis: Secondary | ICD-10-CM | POA: Diagnosis not present

## 2017-07-01 DIAGNOSIS — J4550 Severe persistent asthma, uncomplicated: Secondary | ICD-10-CM | POA: Diagnosis not present

## 2017-07-01 DIAGNOSIS — J3089 Other allergic rhinitis: Secondary | ICD-10-CM | POA: Diagnosis not present

## 2017-07-02 ENCOUNTER — Ambulatory Visit: Payer: Self-pay

## 2017-07-02 ENCOUNTER — Ambulatory Visit (INDEPENDENT_AMBULATORY_CARE_PROVIDER_SITE_OTHER): Payer: Medicare Other | Admitting: Allergy and Immunology

## 2017-07-02 ENCOUNTER — Encounter: Payer: Self-pay | Admitting: Allergy and Immunology

## 2017-07-02 ENCOUNTER — Telehealth: Payer: Self-pay | Admitting: Allergy and Immunology

## 2017-07-02 VITALS — BP 122/72 | HR 84 | Resp 20

## 2017-07-02 DIAGNOSIS — M21622 Bunionette of left foot: Secondary | ICD-10-CM | POA: Diagnosis not present

## 2017-07-02 DIAGNOSIS — Z79899 Other long term (current) drug therapy: Secondary | ICD-10-CM | POA: Diagnosis not present

## 2017-07-02 DIAGNOSIS — K219 Gastro-esophageal reflux disease without esophagitis: Secondary | ICD-10-CM

## 2017-07-02 DIAGNOSIS — J4550 Severe persistent asthma, uncomplicated: Secondary | ICD-10-CM | POA: Diagnosis not present

## 2017-07-02 DIAGNOSIS — E119 Type 2 diabetes mellitus without complications: Secondary | ICD-10-CM | POA: Diagnosis not present

## 2017-07-02 DIAGNOSIS — J3089 Other allergic rhinitis: Secondary | ICD-10-CM | POA: Diagnosis not present

## 2017-07-02 DIAGNOSIS — M21621 Bunionette of right foot: Secondary | ICD-10-CM | POA: Diagnosis not present

## 2017-07-02 NOTE — Patient Instructions (Signed)
  1. Continue Advair 230 - 2 inhalations twice a day  2. Continue Flovent 220 - 2 inhalations twice a day added to Advair during "FLARE UP"  3. Continue Spiriva 2.5 respimat - two inhalations one time per day.  4. Continue pantoprazole 40mg  in the AM and Ranitidine 300mg  in the PM  5. Continue Xolair and Epi-Pen  6. Continue Montelukast 10mg  - one tablet one time per day.  7. If needed:   A. Nasal saline several times per day  B. Mucinex DM two times per day  C. nasal azelastine two sprays each nostril 1-2 times per day  D. Provental HFA or albuterol nebulization  8. Return to clinic in 3 months or earlier if there is a problem.

## 2017-07-02 NOTE — Progress Notes (Signed)
Follow-up Note  Referring Provider: [name], MD Primary Provider: [name], MD Date of Office Visit: 07/02/2017  Subjective:   [name] (DOB: [birth year]) is a 71 y.o. female who returns to the All[facility] and [place] on 07/02/2017 in re-evaluation of the following:  HPI: [name] returns to this clinic in reevaluation of her asthma and allergic rhinitis and LPR and a history of pulmonary hypertension and diastolic dysfunction.  I have not seen her in this clinic since 07 May 2017.  At that point in time she appeared to have a viral respiratory tract infection that was addressed conservatively.  Fortunately, she has completely resolved all the issues associated with that viral respiratory tract infection and never required an antibiotic or systemic steroid to treat any type of respiratory tract issue.  Overall she feels as though she has done very well and rarely uses a short acting bronchodilator and can exert herself without any problem.  She has no coughing or wheezing.  She has no nasal congestion or sneezing.  She has no issues with reflux and overall her throat is doing well although she occasionally has some postnasal drip.  She did obtain the flu vaccine.  Allergies as of 07/02/2017      Reactions   Lyrica [pregabalin] Other (See Comments)   Blurred vision.   Penicillins Swelling   Has patient had a PCN reaction causing immediate rash, facial/tongue/throat swelling, SOB or lightheadedness with hypotension patient had a PCN reaction causing severe rash involving mucus membranes or skin necrosis: [id number]} Has patient had a PCN reaction that required hospitalization/No Has patient had a PCN reaction occurring within the last 10 years: No If all of the above answers are "NO", then may proceed with Cephalosporin use.   Phenytoin Sodium Extended Swelling   Swelling    Vicodin [hydrocodone-acetaminophen] Nausea And Vomiting      Medication List      ADVAIR HFA 829-56 MCG/ACT inhaler Generic drug:  fluticasone-salmeterol INHALE 2 PUFFS BY MOUTH TWICE DAILY   albuterol 108 (90 Base) MCG/ACT inhaler Commonly known as:  PROVENTIL HFA Inhale 2 puffs into the lungs every 6 (six) hours as needed for wheezing or shortness of breath.   ALIGN 4 MG Caps Take 1 capsule by mouth daily.   azelastine 0.1 % nasal spray Commonly known as:  ASTELIN USE 1-2 SPRAYS IN EACH NOSTRIL TWICE DAILY AS NEEDED   Biotin 1 MG Caps Take 1 mg by mouth as needed (muscles).   cyclobenzaprine 10 MG tablet Commonly known as:  FLEXERIL Take 1 tablet (10 mg total) by mouth 2 (two) times daily as needed for muscle spasms.   DAIRY-RELIEF PO Take 1 capsule by mouth daily as needed (stomach upset).   diltiazem 120 MG 24 hr capsule Commonly known as:  CARTIA XT Take 1 capsule (120 mg total) by mouth daily. KEEP OV.   DULoxetine 30 MG capsule Commonly known as:  CYMBALTA Take 30 mg by mouth daily.   EPINEPHrine 0.3 mg/0.3 mL Soaj injection Commonly known as:  EPI-PEN USE AS DIRECTED FOR LIFE THREATENING ALLERGIC REACTIONS   fluticasone 220 MCG/ACT inhaler Commonly known as:  FLOVENT HFA Inhale 2 puffs into the lungs 2 (two) times daily as needed.   furosemide 40 MG tablet Commonly known as:  LASIX TAKE 1 TABLET(40 MG) BY MOUTH DAILY   gabapentin 300 MG capsule Commonly known as:  NEURONTIN Take 600 mg by mouth 3 (three) times daily.   GLYXAMBI  10-5 MG Tabs Generic drug:  Empagliflozin-Linagliptin Take 1 tablet by mouth daily.   irbesartan 150 MG tablet Commonly known as:  AVAPRO Take 1 tablet (150 mg total) by mouth daily.   levalbuterol 1.25 MG/3ML nebulizer solution Commonly known as:  XOPENEX Take 1.25 mg by nebulization every 4 (four) hours as needed for wheezing.   Magnesium 250 MG Tabs Take 1 tablet by mouth daily.   montelukast 10 MG tablet Commonly known as:  SINGULAIR TAKE 1 TABLET BY MOUTH EVERY DAY   mupirocin nasal ointment  2 % Commonly known as:  BACTROBAN NASAL Apply to nostril three times daily for 10 days After application, press sides of nose together and gently massage.   nystatin cream Commonly known as:  MYCOSTATIN Apply 1 application topically 2 (two) times daily. Continue 48 hours after rash resolved.   OMEGA 3-6-9 COMPLEX PO Take 2 capsules by mouth daily. Reported on 3/50/0938   [facility] DELICA LANCETS 18E Misc as needed (blood check).   ONETOUCH VERIO test strip Generic drug:  glucose blood 1 each by Other route as needed (blood monitoring).   pantoprazole 40 MG tablet Commonly known as:  PROTONIX TAKE 1 TABLET BY MOUTH EVERY DAY   PATADAY 0.2 % Soln Generic drug:  Olopatadine HCl INT 1 GTT IN OU QD PRN   polyethylene glycol packet Commonly known as:  MIRALAX / GLYCOLAX Take 17 g by mouth daily.   ranitidine 300 MG tablet Commonly known as:  ZANTAC TAKE 1 TABLET BY MOUTH EVERY NIGHT AT BEDTIME   rivaroxaban 20 MG Tabs tablet Commonly known as:  XARELTO Take 1 tablet (20 mg total) by mouth daily with supper.   SPIRIVA RESPIMAT 1.25 MCG/ACT Aers Generic drug:  Tiotropium Bromide Monohydrate INHALE 2 PUFFS BY MOUTH EVERY DAY   SUMAtriptan 100 MG tablet Commonly known as:  IMITREX Take 100 mg x 1 may, repeat in 2 hour for migraine.   traMADol 50 MG tablet Commonly known as:  ULTRAM Take 1 tablet (50 mg total) by mouth every 8 (eight) hours as needed.       Past Medical History:  Diagnosis Date  . Allergic rhinitis   . Allergy   . Arthritis   . Asthma   . Chronic headache   . Diabetes mellitus   . DVT (deep venous thrombosis) ([place])   . Dyspnea   . Heart murmur   . Hypertension   . Pulmonary embolism ([place])   . Pulmonary hypertension ([place])   . Seizures ([place])     Past Surgical History:  Procedure Laterality Date  . ABDOMINAL HYSTERECTOMY     partial, has ovaries  . CARDIAC CATHETERIZATION  12/28/2010   Mod. pulmonary hypertension, normal coronary arteries    . DOPPLER ECHOCARDIOGRAPHY  10/08/2011   EF=>55%,mild asymmetric LVH, mod. TR, mod. PH, mild to mod LA dilatation  . KNEE ARTHROSCOPY Left   . KNEE SURGERY    . Nuclear Stress Test  05/20/2006   No ischemia  . PARTIAL HYSTERECTOMY    . PLANTAR FASCIA SURGERY    . TONSILLECTOMY      Review of systems negative except as noted in HPI / PMHx or noted below:  Review of Systems  Constitutional: Negative.   HENT: Negative.   Eyes: Negative.   Respiratory: Negative.   Cardiovascular: Negative.   Gastrointestinal: Negative.   Genitourinary: Negative.   Musculoskeletal: Negative.   Skin: Negative.   Neurological: Negative.   Endo/Heme/Allergies: Negative.   Psychiatric/Behavioral: Negative.  Objective:   Vitals:   07/02/17 1128  BP: 122/72  Pulse: 84  Resp: 20          Physical Exam  Constitutional: She is well-developed, well-nourished, and in no distress.  HENT:  Head: Normocephalic.  Right Ear: Tympanic membrane, external ear and ear canal normal.  Left Ear: Tympanic membrane, external ear and ear canal normal.  Nose: Nose normal. No mucosal edema or rhinorrhea.  Mouth/Throat: Uvula is midline, oropharynx is clear and moist and mucous membranes are normal. No oropharyngeal exudate.  Eyes: Conjunctivae are normal.  Neck: Trachea normal. No tracheal tenderness present. No tracheal deviation present. No thyromegaly present.  Cardiovascular: Normal rate, regular rhythm, S1 normal, S2 normal and normal heart sounds.  No murmur heard. Pulmonary/Chest: Breath sounds normal. No stridor. No respiratory distress. She has no wheezes. She has no rales.  Musculoskeletal: She exhibits no edema.  Lymphadenopathy:       Head (right side): No tonsillar adenopathy present.       Head (left side): No tonsillar adenopathy present.    She has no cervical adenopathy.  Neurological: She is alert. Gait normal.  Skin: No rash noted. She is not diaphoretic. No erythema. Nails show no  clubbing.  Psychiatric: Mood and affect normal.    Diagnostics:    Spirometry was performed and demonstrated an FEV1 of 1.82 at 87 % of predicted.  The patient had an Asthma Control Test with the following results: ACT Total Score: 24.    Assessment and Plan:   1. Asthma, severe persistent, well-controlled   2. Other allergic rhinitis   3. LPRD (laryngopharyngeal reflux disease)     1. Continue Advair 230 - 2 inhalations twice a day  2. Continue Flovent 220 - 2 inhalations twice a day added to Advair during "FLARE UP"  3. Continue Spiriva 2.5 respimat - two inhalations one time per day.  4. Continue pantoprazole 40mg  in the AM and Ranitidine 300mg  in the PM  5. Continue Xolair and Epi-Pen  6. Continue Montelukast 10mg  - one tablet one time per day.  7. If needed:   A. Nasal saline several times per day  B. Mucinex DM two times per day  C. nasal azelastine two sprays each nostril 1-2 times per day  D. Provental HFA or albuterol nebulization  8. Return to clinic in 3 months or earlier if there is a problem.  At this point Ferrell is doing very well and she will remain on anti-inflammatory agents for her respiratory tract including the use of Xolair and therapy directed against reflux and I will see her back in this clinic in 3 months or earlier if there is a problem.  Allena Katz, MD Allergy / Immunology [place]

## 2017-07-02 NOTE — Telephone Encounter (Signed)
Pt called and needs to talk with dr Neldon Mc about a surgery she got [phone].

## 2017-07-03 ENCOUNTER — Encounter: Payer: Self-pay | Admitting: Allergy and Immunology

## 2017-07-10 ENCOUNTER — Other Ambulatory Visit: Payer: Self-pay | Admitting: Allergy and Immunology

## 2017-07-16 ENCOUNTER — Other Ambulatory Visit: Payer: Self-pay | Admitting: Cardiovascular Disease

## 2017-07-17 ENCOUNTER — Other Ambulatory Visit: Payer: Self-pay | Admitting: Allergy and Immunology

## 2017-07-25 ENCOUNTER — Ambulatory Visit (INDEPENDENT_AMBULATORY_CARE_PROVIDER_SITE_OTHER): Payer: Medicare Other | Admitting: Family Medicine

## 2017-07-25 ENCOUNTER — Encounter: Payer: Self-pay | Admitting: Family Medicine

## 2017-07-25 ENCOUNTER — Other Ambulatory Visit: Payer: Self-pay

## 2017-07-25 VITALS — BP 110/60 | HR 72 | Temp 97.5°F | Ht 64.5 in | Wt 239.0 lb

## 2017-07-25 DIAGNOSIS — N3941 Urge incontinence: Secondary | ICD-10-CM | POA: Diagnosis not present

## 2017-07-25 MED ORDER — NYSTATIN 100000 UNIT/GM EX CREA: 1.0000 "application " | TOPICAL_CREAM | Freq: Two times a day (BID) | CUTANEOUS | 1 refills | Status: DC

## 2017-07-25 NOTE — Patient Instructions (Signed)
Please stop at the front desk to set up referral.  

## 2017-07-25 NOTE — Progress Notes (Signed)
   Subjective:    Patient ID: [name], female    DOB: 1946-04-22, 72 y.o.   MRN: [id number]  HPI    73 year old female with DM presents with continued urge incontinence. She reports She has go to urinate many times a day.. Has tried to decrease soda, lemonade.. Only drink. Has nocturia... Only once at night.  Has urgency... Urine can come out without her control  occ.  Has loss of urine at times.Marland Kitchen 3-4 times a day.. Has to wear pads.  No blood in urine.  She feels like she needs to empty completely.  Gets intertrigo.  Hx dysuria, no abd pain.    She has tried  Retail [employer].Marland Kitchen Has SE of fatigue and dry mouth Myrbetriq  25 mg  Ineffective.. May have been increasing her BP.     Review of Systems  Constitutional: Negative for fatigue and fever.  HENT: Negative for congestion.   Eyes: Negative for pain.  Respiratory: Negative for cough and shortness of breath.   Cardiovascular: Negative for chest pain, palpitations and leg swelling.  Gastrointestinal: Negative for abdominal pain.  Genitourinary: Positive for urgency. Negative for difficulty urinating, dyspareunia, dysuria, flank pain, hematuria and vaginal bleeding.  Musculoskeletal: Negative for back pain.  Neurological: Negative for syncope, light-headedness and headaches.  Psychiatric/Behavioral: Negative for dysphoric mood.       Objective:   Physical Exam  Constitutional: Vital signs are normal. She appears well-developed and well-nourished. She is cooperative.  Non-toxic appearance. She does not appear ill. No distress.  Obesity   HENT:  Head: Normocephalic.  Right Ear: Hearing, tympanic membrane, external ear and ear canal normal. Tympanic membrane is not erythematous, not retracted and not bulging.  Left Ear: Hearing, tympanic membrane, external ear and ear canal normal. Tympanic membrane is not erythematous, not retracted and not bulging.  Nose: No mucosal edema or rhinorrhea. Right sinus exhibits no maxillary  sinus tenderness and no frontal sinus tenderness. Left sinus exhibits no maxillary sinus tenderness and no frontal sinus tenderness.  Mouth/Throat: Uvula is midline, oropharynx is clear and moist and mucous membranes are normal.  Eyes: Conjunctivae, EOM and lids are normal. Pupils are equal, round, and reactive to light. Lids are everted and swept, no foreign bodies found.  Neck: Trachea normal and normal range of motion. Neck supple. Carotid bruit is not present. No thyroid mass and no thyromegaly present.  Cardiovascular: Normal rate, regular rhythm, S1 normal, S2 normal, normal heart sounds, intact distal pulses and normal pulses. Exam reveals no gallop and no friction rub.  No murmur heard. Pulmonary/Chest: Effort normal and breath sounds normal. No tachypnea. No respiratory distress. She has no decreased breath sounds. She has no wheezes. She has no rhonchi. She has no rales.  Abdominal: Soft. Normal appearance and bowel sounds are normal. There is no tenderness.  Neurological: She is alert.  Skin: Skin is warm, dry and intact. No rash noted.  Psychiatric: Her speech is normal and behavior is normal. Judgment and thought content normal. Her mood appears not anxious. Cognition and memory are normal. She does not exhibit a depressed mood.          Assessment & Plan:

## 2017-07-28 ENCOUNTER — Other Ambulatory Visit: Payer: Self-pay | Admitting: Allergy and Immunology

## 2017-07-29 DIAGNOSIS — J4550 Severe persistent asthma, uncomplicated: Secondary | ICD-10-CM | POA: Diagnosis not present

## 2017-07-30 ENCOUNTER — Ambulatory Visit (INDEPENDENT_AMBULATORY_CARE_PROVIDER_SITE_OTHER): Payer: Medicare Other | Admitting: *Deleted

## 2017-07-30 DIAGNOSIS — J4550 Severe persistent asthma, uncomplicated: Secondary | ICD-10-CM | POA: Diagnosis not present

## 2017-08-01 DIAGNOSIS — M21622 Bunionette of left foot: Secondary | ICD-10-CM | POA: Diagnosis not present

## 2017-08-06 DIAGNOSIS — M25572 Pain in left ankle and joints of left foot: Secondary | ICD-10-CM | POA: Diagnosis not present

## 2017-08-07 ENCOUNTER — Other Ambulatory Visit: Payer: Self-pay | Admitting: Allergy and Immunology

## 2017-08-11 ENCOUNTER — Other Ambulatory Visit: Payer: Self-pay | Admitting: Cardiovascular Disease

## 2017-08-12 ENCOUNTER — Other Ambulatory Visit: Payer: Self-pay | Admitting: Cardiovascular Disease

## 2017-08-12 ENCOUNTER — Other Ambulatory Visit: Payer: Self-pay | Admitting: Allergy and Immunology

## 2017-08-12 NOTE — Telephone Encounter (Signed)
Rx(s) sent to pharmacy electronically.  

## 2017-08-20 ENCOUNTER — Encounter: Payer: Self-pay | Admitting: Family Medicine

## 2017-08-20 DIAGNOSIS — E559 Vitamin D deficiency, unspecified: Secondary | ICD-10-CM | POA: Diagnosis not present

## 2017-08-20 DIAGNOSIS — M25572 Pain in left ankle and joints of left foot: Secondary | ICD-10-CM | POA: Diagnosis not present

## 2017-08-20 DIAGNOSIS — E118 Type 2 diabetes mellitus with unspecified complications: Secondary | ICD-10-CM | POA: Diagnosis not present

## 2017-08-26 DIAGNOSIS — J4550 Severe persistent asthma, uncomplicated: Secondary | ICD-10-CM | POA: Diagnosis not present

## 2017-08-27 ENCOUNTER — Ambulatory Visit (INDEPENDENT_AMBULATORY_CARE_PROVIDER_SITE_OTHER): Payer: Medicare Other | Admitting: *Deleted

## 2017-08-27 DIAGNOSIS — I10 Essential (primary) hypertension: Secondary | ICD-10-CM | POA: Diagnosis not present

## 2017-08-27 DIAGNOSIS — J4550 Severe persistent asthma, uncomplicated: Secondary | ICD-10-CM | POA: Diagnosis not present

## 2017-08-27 DIAGNOSIS — E118 Type 2 diabetes mellitus with unspecified complications: Secondary | ICD-10-CM | POA: Diagnosis not present

## 2017-08-27 DIAGNOSIS — G629 Polyneuropathy, unspecified: Secondary | ICD-10-CM | POA: Diagnosis not present

## 2017-08-27 DIAGNOSIS — I482 Chronic atrial fibrillation: Secondary | ICD-10-CM | POA: Diagnosis not present

## 2017-09-03 DIAGNOSIS — I739 Peripheral vascular disease, unspecified: Secondary | ICD-10-CM | POA: Diagnosis not present

## 2017-09-03 DIAGNOSIS — M25572 Pain in left ankle and joints of left foot: Secondary | ICD-10-CM | POA: Diagnosis not present

## 2017-09-03 DIAGNOSIS — L84 Corns and callosities: Secondary | ICD-10-CM | POA: Diagnosis not present

## 2017-09-03 DIAGNOSIS — E1151 Type 2 diabetes mellitus with diabetic peripheral angiopathy without gangrene: Secondary | ICD-10-CM | POA: Diagnosis not present

## 2017-09-14 DIAGNOSIS — M1711 Unilateral primary osteoarthritis, right knee: Secondary | ICD-10-CM | POA: Diagnosis not present

## 2017-09-14 DIAGNOSIS — M170 Bilateral primary osteoarthritis of knee: Secondary | ICD-10-CM | POA: Diagnosis not present

## 2017-09-14 DIAGNOSIS — M25512 Pain in left shoulder: Secondary | ICD-10-CM | POA: Diagnosis not present

## 2017-09-16 ENCOUNTER — Other Ambulatory Visit: Payer: Self-pay | Admitting: Allergy and Immunology

## 2017-09-20 ENCOUNTER — Telehealth: Payer: Self-pay | Admitting: *Deleted

## 2017-09-20 NOTE — Telephone Encounter (Signed)
   [facility] Pre-operative Risk Assessment    Request for surgical clearance:  1. What type of surgery is being performed? COLONOSCOPY    2. When is this surgery scheduled? 10/09/17   3. What type of clearance is required (medical clearance vs. Pharmacy clearance to hold med vs. Both)? PHARMACY    4. Are there any medications that need to be held prior to surgery and how long? HOLD XARELTO NIGHT BEFORE COLONOSCOPY  5. Practice name and name of physician performing surgery? [facility]    6. What is your office phone and fax number? PHONE [phone] FAX [phone] 7. Anesthesia type (None, local, MAC, general) ?

## 2017-09-23 DIAGNOSIS — J4550 Severe persistent asthma, uncomplicated: Secondary | ICD-10-CM | POA: Diagnosis not present

## 2017-09-23 DIAGNOSIS — I272 Pulmonary hypertension, unspecified: Secondary | ICD-10-CM | POA: Diagnosis not present

## 2017-09-23 DIAGNOSIS — J3089 Other allergic rhinitis: Secondary | ICD-10-CM | POA: Diagnosis not present

## 2017-09-23 DIAGNOSIS — I519 Heart disease, unspecified: Secondary | ICD-10-CM | POA: Diagnosis not present

## 2017-09-23 DIAGNOSIS — K219 Gastro-esophageal reflux disease without esophagitis: Secondary | ICD-10-CM | POA: Diagnosis not present

## 2017-09-23 NOTE — Telephone Encounter (Signed)
   Primary Cardiologist: Dr. Sallyanne Kuster  Chart reviewed as part of pre-operative protocol coverage. Patient was contacted 09/23/2017 in reference to pre-operative risk assessment for pending surgery as outlined below.  [name] was last seen on 04/15/17 by [name], PA-C.  Since that day, SHAMIAH KAHLER has done well w/o cardiac symptoms. No chest pain. No dyspnea.  Therefore, based on ACC/AHA guidelines, the patient would be at acceptable risk for the planned procedure without further cardiovascular testing.   I will route chart to Pharm D to address Xarelto.     Lyda Jester, PA-C 09/23/2017, 4:29 PM

## 2017-09-23 NOTE — Telephone Encounter (Signed)
Pt takes Xarelto afib with CHADS2VASc score of 4 (age, sex, HTN, DM). Also has hx of PE in 2004 and DVT (noted in [place] in 2015, unclear of DVT date). She has had workup for hypercoagulable conditions (lupus anticoagulant positive on one of 2 separate assays, protein S activity decreased with normal total protein S level). Renal function is normal.  Ok to hold Xarelto for 1 dose prior to procedure and resume as soon as possible after due to hypercoagulability history.

## 2017-09-24 ENCOUNTER — Encounter: Payer: Self-pay | Admitting: Allergy and Immunology

## 2017-09-24 ENCOUNTER — Ambulatory Visit (INDEPENDENT_AMBULATORY_CARE_PROVIDER_SITE_OTHER): Payer: Medicare Other | Admitting: Allergy and Immunology

## 2017-09-24 ENCOUNTER — Ambulatory Visit: Payer: Medicare Other

## 2017-09-24 VITALS — BP 130/74 | HR 84 | Resp 20

## 2017-09-24 DIAGNOSIS — K219 Gastro-esophageal reflux disease without esophagitis: Secondary | ICD-10-CM

## 2017-09-24 DIAGNOSIS — I272 Pulmonary hypertension, unspecified: Secondary | ICD-10-CM

## 2017-09-24 DIAGNOSIS — I519 Heart disease, unspecified: Secondary | ICD-10-CM | POA: Diagnosis not present

## 2017-09-24 DIAGNOSIS — S92344A Nondisplaced fracture of fourth metatarsal bone, right foot, initial encounter for closed fracture: Secondary | ICD-10-CM | POA: Diagnosis not present

## 2017-09-24 DIAGNOSIS — M25572 Pain in left ankle and joints of left foot: Secondary | ICD-10-CM | POA: Diagnosis not present

## 2017-09-24 DIAGNOSIS — J3089 Other allergic rhinitis: Secondary | ICD-10-CM | POA: Diagnosis not present

## 2017-09-24 DIAGNOSIS — I5189 Other ill-defined heart diseases: Secondary | ICD-10-CM

## 2017-09-24 DIAGNOSIS — J4550 Severe persistent asthma, uncomplicated: Secondary | ICD-10-CM

## 2017-09-24 NOTE — Patient Instructions (Addendum)
  1. Continue Advair 230 - 2 inhalations twice a day  2. Continue Flovent 220 - 2 inhalations twice a day added to Advair during "FLARE UP"  3. Continue Spiriva 2.5 respimat - two inhalations one time per day.  4. Continue pantoprazole 40mg  in the AM and Ranitidine 300mg  in the PM  5. Continue Xolair and Epi-Pen  6. Continue Montelukast 10mg  - one tablet one time per day.  7. If needed:   A. Nasal saline several times per day  B. Mucinex DM two times per day  C. nasal azelastine two sprays each nostril 1-2 times per day  D. Proventil HFA or albuterol nebulization  8. Return to clinic in summer 2019 or earlier if there is a problem.

## 2017-09-24 NOTE — Progress Notes (Signed)
Follow-up Note  Referring Provider: [name], MD Primary Provider: [name], MD Date of Office Visit: 09/24/2017  Subjective:   [name] (DOB: [birth year]) is a 72 y.o. female who returns to the [facility] on 09/24/2017 in re-evaluation of the following:  HPI: [name] returns to this clinic in reevaluation of asthma and allergic rhinitis and LPR and a history of pulmonary hypertension and diastolic dysfunction.  I have not seen her in this clinic since 26 June 2017.  Overall she has had a very good interval of time regarding her respiratory tract.  She rarely uses a short acting bronchodilator.  She has not been able to exercise because of a foot issue that required surgery in January.  About 2 weeks ago she did develop a slight cough and some slight sore throat and had an episode of epistaxis involving her left nostril but all that has basically resolved.  She did utilize her action plan during that timeframe.  She had an episode of sternal chest pain and "indigestion" while at church for which she took some Tums and then had evaluation by EMT.  Apparently everything was stable and she saw her cardiologist the next day and apparently her cardiac evaluation was normal.  She has very little issue with her upper airways at this point in time.  Her reflux and her throat are doing very well on her current therapy.  Allergies as of 09/24/2017      Reactions   Lyrica [pregabalin] Other (See Comments)   Blurred vision.   Penicillins Swelling   Has patient had a PCN reaction causing immediate rash, facial/tongue/throat swelling, SOB or lightheadedness with hypotension patient had a PCN reaction causing severe rash involving mucus membranes or skin necrosis: [id number]} Has patient had a PCN reaction that required hospitalization/No Has patient had a PCN reaction occurring within the last 10 years: No If all of the above answers are "NO", then may proceed  with Cephalosporin use.   Phenytoin Sodium Extended Swelling   Swelling    Vicodin [hydrocodone-acetaminophen] Nausea And Vomiting      Medication List      ADVAIR HFA 182-99 MCG/ACT inhaler Generic drug:  fluticasone-salmeterol INHALE 2 PUFFS BY MOUTH TWICE DAILY   albuterol 108 (90 Base) MCG/ACT inhaler Commonly known as:  PROVENTIL HFA Inhale 2 puffs into the lungs every 6 (six) hours as needed for wheezing or shortness of breath.   ALIGN 4 MG Caps Take 1 capsule by mouth daily.   azelastine 0.1 % nasal spray Commonly known as:  ASTELIN USE 1-2 SPRAYS IN EACH NOSTRIL TWICE DAILY AS NEEDED   Biotin 1 MG Caps Take 1 mg by mouth as needed (muscles).   cyclobenzaprine 10 MG tablet Commonly known as:  FLEXERIL Take 1 tablet (10 mg total) by mouth 2 (two) times daily as needed for muscle spasms.   DAIRY-RELIEF PO Take 1 capsule by mouth daily as needed (stomach upset).   diltiazem 120 MG 24 hr capsule Commonly known as:  CARTIA XT Take 1 capsule (120 mg total) by mouth daily. KEEP OV.   DULoxetine 30 MG capsule Commonly known as:  CYMBALTA Take 30 mg by mouth daily.   EPINEPHrine 0.3 mg/0.3 mL Soaj injection Commonly known as:  EPI-PEN USE AS DIRECTED FOR LIFE THREATENING ALLERGIC REACTIONS   fluticasone 220 MCG/ACT inhaler Commonly known as:  FLOVENT HFA Inhale 2 puffs into the lungs 2 (two) times daily as needed.  furosemide 40 MG tablet Commonly known as:  LASIX TAKE 1 TABLET(40 MG) BY MOUTH DAILY   gabapentin 300 MG capsule Commonly known as:  NEURONTIN Take 600 mg by mouth 3 (three) times daily.   GLYXAMBI 10-5 MG Tabs Generic drug:  Empagliflozin-Linagliptin Take 1 tablet by mouth daily.   irbesartan 150 MG tablet Commonly known as:  AVAPRO TAKE 1 TABLET(150 MG) BY MOUTH DAILY   levalbuterol 1.25 MG/3ML nebulizer solution Commonly known as:  XOPENEX Take 1.25 mg by nebulization every 4 (four) hours as needed for wheezing.   Magnesium 250 MG  Tabs Take 1 tablet by mouth daily.   montelukast 10 MG tablet Commonly known as:  SINGULAIR TAKE 1 TABLET BY MOUTH EVERY DAY   montelukast 10 MG tablet Commonly known as:  SINGULAIR TAKE 1 TABLET BY MOUTH EVERY DAY   mupirocin nasal ointment 2 % Commonly known as:  BACTROBAN NASAL Apply to nostril three times daily for 10 days After application, press sides of nose together and gently massage.   nystatin cream Commonly known as:  MYCOSTATIN Apply 1 application topically 2 (two) times daily. Continue 48 hours after rash resolved.   OMEGA 3-6-9 COMPLEX PO Take 2 capsules by mouth daily. Reported on 12/01/8414   [facility] DELICA LANCETS 60Y Misc as needed (blood check).   ONETOUCH VERIO test strip Generic drug:  glucose blood 1 each by Other route as needed (blood monitoring).   pantoprazole 40 MG tablet Commonly known as:  PROTONIX TAKE 1 TABLET BY MOUTH EVERY DAY   PATADAY 0.2 % Soln Generic drug:  Olopatadine HCl INT 1 GTT IN OU QD PRN   polyethylene glycol packet Commonly known as:  MIRALAX / GLYCOLAX Take 17 g by mouth daily.   ranitidine 300 MG tablet Commonly known as:  ZANTAC TAKE 1 TABLET BY MOUTH EVERY NIGHT AT BEDTIME   SPIRIVA RESPIMAT 1.25 MCG/ACT Aers Generic drug:  Tiotropium Bromide Monohydrate INHALE 2 PUFFS BY MOUTH EVERY DAY   SUMAtriptan 100 MG tablet Commonly known as:  IMITREX Take 100 mg x 1 may, repeat in 2 hour for migraine.   traMADol 50 MG tablet Commonly known as:  ULTRAM Take 1 tablet (50 mg total) by mouth every 8 (eight) hours as needed.   XARELTO 20 MG Tabs tablet Generic drug:  rivaroxaban TAKE 1 TABLET(20 MG) BY MOUTH DAILY WITH SUPPER       Past Medical History:  Diagnosis Date  . Allergic rhinitis   . Allergy   . Arthritis   . Asthma   . Chronic headache   . Diabetes mellitus   . DVT (deep venous thrombosis) ([place])   . Dyspnea   . Heart murmur   . Hypertension   . Pulmonary embolism ([place])   . Pulmonary  hypertension ([place])   . Seizures ([place])     Past Surgical History:  Procedure Laterality Date  . ABDOMINAL HYSTERECTOMY     partial, has ovaries  . CARDIAC CATHETERIZATION  12/28/2010   Mod. pulmonary hypertension, normal coronary arteries  . DOPPLER ECHOCARDIOGRAPHY  10/08/2011   EF=>55%,mild asymmetric LVH, mod. TR, mod. PH, mild to mod LA dilatation  . KNEE ARTHROSCOPY Left   . KNEE SURGERY    . Nuclear Stress Test  05/20/2006   No ischemia  . PARTIAL HYSTERECTOMY    . PLANTAR FASCIA SURGERY    . TONSILLECTOMY      Review of systems negative except as noted in HPI / PMHx or noted below:  Review  of Systems  Constitutional: Negative.   HENT: Negative.   Eyes: Negative.   Respiratory: Negative.   Cardiovascular: Negative.   Gastrointestinal: Negative.   Genitourinary: Negative.   Musculoskeletal: Negative.   Skin: Negative.   Neurological: Negative.   Endo/Heme/Allergies: Negative.   Psychiatric/Behavioral: Negative.      Objective:   Vitals:   09/24/17 1224  BP: 130/74  Pulse: 84  Resp: 20          Physical Exam  Constitutional: She is well-developed, well-nourished, and in no distress.  HENT:  Head: Normocephalic.  Right Ear: Tympanic membrane, external ear and ear canal normal.  Left Ear: Tympanic membrane, external ear and ear canal normal.  Nose: Nose normal. No mucosal edema or rhinorrhea.  Mouth/Throat: Uvula is midline, oropharynx is clear and moist and mucous membranes are normal. No oropharyngeal exudate.  Eyes: Conjunctivae are normal.  Neck: Trachea normal. No tracheal tenderness present. No tracheal deviation present. No thyromegaly present.  Cardiovascular: Normal rate, regular rhythm, S1 normal and S2 normal.  Murmur (Systolic) heard. Pulmonary/Chest: Breath sounds normal. No stridor. No respiratory distress. She has no wheezes. She has no rales.  Musculoskeletal: She exhibits no edema.  Lymphadenopathy:       Head (right side): No tonsillar  adenopathy present.       Head (left side): No tonsillar adenopathy present.    She has no cervical adenopathy.  Neurological: She is alert. Gait normal.  Skin: No rash noted. She is not diaphoretic. No erythema. Nails show no clubbing.  Psychiatric: Mood and affect normal.    Diagnostics:    Spirometry was performed and demonstrated an FEV1 of 1.85 at 101 % of predicted.  The patient had an Asthma Control Test with the following results: ACT Total Score: 20.    Assessment and Plan:   1. Asthma, severe persistent, well-controlled   2. Other allergic rhinitis   3. LPRD (laryngopharyngeal reflux disease)   4. Pulmonary hypertension ([place])   5. Diastolic dysfunction     1. Continue Advair 230 - 2 inhalations twice a day  2. Continue Flovent 220 - 2 inhalations twice a day added to Advair during "FLARE UP"  3. Continue Spiriva 2.5 respimat - two inhalations one time per day.  4. Continue pantoprazole 40mg  in the AM and Ranitidine 300mg  in the PM  5. Continue Xolair and Epi-Pen  6. Continue Montelukast 10mg  - one tablet one time per day.  7. If needed:   A. Nasal saline several times per day  B. Mucinex DM two times per day  C. nasal azelastine two sprays each nostril 1-2 times per day  D. Proventil HFA or albuterol nebulization  8. Return to clinic in summer 2019 or earlier if there is a problem.  Overall Kaelei appears to be doing quite well regarding her respiratory tract.  The inflammatory component of her respiratory tract symptomatology appears to be under very good control with her current plan and her reflux component also appears to be under good control with her plan.  Her pulmonary hypertension and diastolic dysfunction is managed by her cardiologist.  I will see her back in this clinic in the summer 2019 or earlier if there is a problem.  Allena Katz, MD Allergy / Immunology [place]

## 2017-09-24 NOTE — Telephone Encounter (Signed)
Faxed to Buccini's office

## 2017-09-25 ENCOUNTER — Telehealth: Payer: Self-pay | Admitting: *Deleted

## 2017-09-25 ENCOUNTER — Encounter: Payer: Self-pay | Admitting: Allergy and Immunology

## 2017-09-25 NOTE — Telephone Encounter (Signed)
Ms. Vaccaro notified as instructed by telephone.  Appointment moved up to Friday 09/27/2017 at 11:45 am with Dr. Diona Browner.

## 2017-09-25 NOTE — Telephone Encounter (Signed)
Copied from [place]. Topic: General - Other >> Sep 25, 2017 12:00 PM [name] wrote: Reason for CRM: patient has an appt on Tuesday for hot flashes she states that he has a HX of this and would like for Dr Diona Browner assistant to give her a call back to see what she can do about this symptom until she come in on Tuesday

## 2017-09-25 NOTE — Telephone Encounter (Signed)
I am not sure  What is causing this.. So no specific recommendations. If she cannot tolerate it until Tuesday.. She could see Dr. Lorelei Pont or other earlier.

## 2017-09-26 DIAGNOSIS — M25512 Pain in left shoulder: Secondary | ICD-10-CM | POA: Diagnosis not present

## 2017-09-27 ENCOUNTER — Encounter: Payer: Self-pay | Admitting: Family Medicine

## 2017-09-27 ENCOUNTER — Ambulatory Visit (INDEPENDENT_AMBULATORY_CARE_PROVIDER_SITE_OTHER): Payer: Medicare Other | Admitting: Family Medicine

## 2017-09-27 VITALS — BP 130/84 | HR 96 | Temp 98.0°F | Wt 209.0 lb

## 2017-09-27 DIAGNOSIS — R232 Flushing: Secondary | ICD-10-CM

## 2017-09-27 DIAGNOSIS — E0840 Diabetes mellitus due to underlying condition with diabetic neuropathy, unspecified: Secondary | ICD-10-CM | POA: Diagnosis not present

## 2017-09-27 LAB — COMPREHENSIVE METABOLIC PANEL
ALT: 19 U/L (ref 0–35)
AST: 18 U/L (ref 0–37)
Albumin: 3.9 g/dL (ref 3.5–5.2)
Alkaline Phosphatase: 76 U/L (ref 39–117)
BUN: 12 mg/dL (ref 6–23)
CO2: 29 mEq/L (ref 19–32)
Calcium: 9.6 mg/dL (ref 8.4–10.5)
Chloride: 105 mEq/L (ref 96–112)
Creatinine, Ser: 0.89 mg/dL (ref 0.40–1.20)
GFR: 80.37 mL/min (ref >60.00)
Glucose, Bld: 136 mg/dL — ABNORMAL HIGH (ref 70–99)
Potassium: 4.7 mEq/L (ref 3.5–5.1)
Sodium: 140 mEq/L (ref 135–145)
Total Bilirubin: 0.9 mg/dL (ref 0.2–1.2)
Total Protein: 6.8 g/dL (ref 6.0–8.3)

## 2017-09-27 LAB — LIPID PANEL
Cholesterol: 108 mg/dL (ref 0–200)
HDL: 51.6 mg/dL (ref >39.00)
LDL Cholesterol: 47 mg/dL (ref 0–99)
NonHDL: 56.36
Total CHOL/HDL Ratio: 2
Triglycerides: 46 mg/dL (ref 0.0–149.0)
VLDL: 9.2 mg/dL (ref 0.0–40.0)

## 2017-09-27 LAB — CBC WITH DIFFERENTIAL/PLATELET
Basophils Absolute: 0.1 10*3/uL (ref 0.0–0.1)
Basophils Relative: 0.7 % (ref 0.0–3.0)
Eosinophils Absolute: 0.1 10*3/uL (ref 0.0–0.7)
Eosinophils Relative: 1.4 % (ref 0.0–5.0)
HCT: 44.4 % (ref 36.0–46.0)
Hemoglobin: 14.5 g/dL (ref 12.0–15.0)
Lymphocytes Relative: 32.7 % (ref 12.0–46.0)
Lymphs Abs: 3 10*3/uL (ref 0.7–4.0)
MCHC: 32.5 g/dL (ref 30.0–36.0)
MCV: 77 fl — ABNORMAL LOW (ref 78.0–100.0)
Monocytes Absolute: 0.9 10*3/uL (ref 0.1–1.0)
Monocytes Relative: 9.8 % (ref 3.0–12.0)
Neutro Abs: 5 10*3/uL (ref 1.4–7.7)
Neutrophils Relative %: 55.4 % (ref 43.0–77.0)
Platelets: 337 10*3/uL (ref 150.0–400.0)
RBC: 5.77 Mil/uL — ABNORMAL HIGH (ref 3.87–5.11)
RDW: 15.5 % (ref 11.5–15.5)
WBC: 9 10*3/uL (ref 4.0–10.5)

## 2017-09-27 LAB — TSH: TSH: <0.01 u[IU]/mL — ABNORMAL LOW (ref 0.35–4.50)

## 2017-09-27 LAB — T4, FREE: Free T4: 1.94 ng/dL — ABNORMAL HIGH (ref 0.60–1.60)

## 2017-09-27 LAB — T3, FREE: T3, Free: 3.4 pg/mL (ref 2.3–4.2)

## 2017-09-27 NOTE — Assessment & Plan Note (Signed)
No clear sign of infection.  Has had weight loss, but trying to lose weight.  Will eval with labs. ? Due to medication, but no clear causative changes.

## 2017-09-27 NOTE — Patient Instructions (Signed)
Please stop at the lab to have labs drawn.  

## 2017-09-27 NOTE — Progress Notes (Signed)
Subjective:    Patient ID: [name], female    DOB: August 27, 1945, 72 y.o.   MRN: [id number]  HPI    72 year old female with asthma, migraine, paroxsysmal afib, HTN, depression presents with  hot flashes.  She is on many medications. No changes or new meds except she has been off gabapentin ( has been off since losing pills in last 2 weeks). Plans to restart.  She reports in last 3-4 weeks she has been feeling very hot for spells, improves some then comes back.  no other symptoms. NO CP, no SOB.  Still fatigued. She has tried eating  Ice.  No unexpected weight loss.. Has lost 30 lbs .Marland Kitchen But has been trying to lose weihgt  She has also restarted having itching. She is currently in a brace for left foot injury, ? Metatarsal fracture.   DM better controlled on 08/20/2017 per ENDO: A1C 6.6 NML CMET.  Blood pressure 130/84, pulse 96, temperature 98 F (36.7 C), temperature source Oral, weight 209 lb (94.8 kg), SpO2 99 %.   Wt Readings from Last 3 Encounters:  09/27/17 209 lb (94.8 kg)  07/25/17 239 lb (108.4 kg)  04/15/17 231 lb (104.8 kg)     Has upcoming colonoscopy  Review of Systems  Constitutional: Negative for fatigue and fever.  HENT: Negative for congestion.         Easy bruising  But no different on xarelto  and one episode of bloody nose  Eyes: Negative for pain.  Respiratory: Negative for cough and shortness of breath.   Cardiovascular: Negative for chest pain, palpitations and leg swelling.  Gastrointestinal: Negative for abdominal pain.  Genitourinary: Negative for dysuria and vaginal bleeding.  Musculoskeletal: Negative for back pain.  Neurological: Negative for syncope, light-headedness and headaches.  Psychiatric/Behavioral: Negative for dysphoric mood.       Objective:   Physical Exam  Constitutional: Vital signs are normal. She appears well-developed and well-nourished. She is cooperative.  Non-toxic appearance. She does not appear ill. No distress.    obese  HENT:  Head: Normocephalic.  Right Ear: Hearing, tympanic membrane, external ear and ear canal normal.  Left Ear: Hearing, tympanic membrane, external ear and ear canal normal.  Nose: Nose normal.  Eyes: Conjunctivae, EOM and lids are normal. Pupils are equal, round, and reactive to light. Lids are everted and swept, no foreign bodies found.  Neck: Trachea normal and normal range of motion. Neck supple. Carotid bruit is not present. No thyroid mass and no thyromegaly present.  Cardiovascular: Normal rate, regular rhythm, S1 normal, S2 normal and intact distal pulses. Exam reveals no gallop.  Murmur heard. Pulmonary/Chest: Effort normal and breath sounds normal. No respiratory distress. She has no wheezes. She has no rhonchi. She has no rales.  Abdominal: Soft. Normal appearance and bowel sounds are normal. She exhibits no distension, no fluid wave, no abdominal bruit and no mass. There is no hepatosplenomegaly. There is no tenderness. There is no rebound, no guarding and no CVA tenderness. No hernia.  Lymphadenopathy:    She has no cervical adenopathy.    She has no axillary adenopathy.  Neurological: She is alert. She has normal strength. No cranial nerve deficit or sensory deficit.  Skin: Skin is warm, dry and intact. No rash noted.  Psychiatric: Her speech is normal and behavior is normal. Judgment normal. Her mood appears not anxious. Cognition and memory are normal. She does not exhibit a depressed mood.  Assessment & Plan:

## 2017-09-27 NOTE — Addendum Note (Signed)
Addended byEliezer Lofts E on: 09/27/2017 12:12 PM   Modules accepted: Orders

## 2017-09-30 ENCOUNTER — Telehealth: Payer: Self-pay | Admitting: *Deleted

## 2017-09-30 NOTE — Telephone Encounter (Signed)
Spoke with Ms. Cronin.  She states she was half a sleep when I called earlier and wanted me to go over her thyroid results with her again.  Results discussed.  She is aware that results have been faxed over to her endocrinologist and they should be contacting her in regards to these results.  Dr. Diona Browner wants them to manage this.  Patient states understanding.

## 2017-09-30 NOTE — Telephone Encounter (Signed)
Copied from [place]. Topic: Inquiry >> Sep 30, 2017 11:11 AM Arletha Grippe wrote: Reason for CRM: pt called, she would like call back from Kenneth Cuaresma about labs.  Please call [phone]

## 2017-10-01 ENCOUNTER — Ambulatory Visit: Payer: Medicare Other | Admitting: Family Medicine

## 2017-10-01 ENCOUNTER — Other Ambulatory Visit: Payer: Medicare Other

## 2017-10-01 ENCOUNTER — Telehealth: Payer: Self-pay | Admitting: Family Medicine

## 2017-10-01 DIAGNOSIS — Z803 Family history of malignant neoplasm of breast: Secondary | ICD-10-CM | POA: Diagnosis not present

## 2017-10-01 DIAGNOSIS — Z1231 Encounter for screening mammogram for malignant neoplasm of breast: Secondary | ICD-10-CM | POA: Diagnosis not present

## 2017-10-01 DIAGNOSIS — L309 Dermatitis, unspecified: Secondary | ICD-10-CM | POA: Diagnosis not present

## 2017-10-01 LAB — HM MAMMOGRAPHY

## 2017-10-01 NOTE — Telephone Encounter (Signed)
-----   Message from Ellamae Sia sent at 09/25/2017 10:37 AM EDT ----- Regarding: Lab orders for Tuesday, 3.19.19 Lab orders for a DM f/u

## 2017-10-03 ENCOUNTER — Encounter: Payer: Self-pay | Admitting: Family Medicine

## 2017-10-08 ENCOUNTER — Ambulatory Visit: Payer: Medicare Other | Admitting: Family Medicine

## 2017-10-08 DIAGNOSIS — N3941 Urge incontinence: Secondary | ICD-10-CM | POA: Diagnosis not present

## 2017-10-08 DIAGNOSIS — R351 Nocturia: Secondary | ICD-10-CM | POA: Diagnosis not present

## 2017-10-08 DIAGNOSIS — N3944 Nocturnal enuresis: Secondary | ICD-10-CM | POA: Diagnosis not present

## 2017-10-08 DIAGNOSIS — R350 Frequency of micturition: Secondary | ICD-10-CM | POA: Diagnosis not present

## 2017-10-09 ENCOUNTER — Encounter: Payer: Self-pay | Admitting: Family Medicine

## 2017-10-09 DIAGNOSIS — K5140 Inflammatory polyps of colon without complications: Secondary | ICD-10-CM | POA: Diagnosis not present

## 2017-10-09 DIAGNOSIS — K635 Polyp of colon: Secondary | ICD-10-CM | POA: Diagnosis not present

## 2017-10-09 DIAGNOSIS — D126 Benign neoplasm of colon, unspecified: Secondary | ICD-10-CM | POA: Diagnosis not present

## 2017-10-09 DIAGNOSIS — K5730 Diverticulosis of large intestine without perforation or abscess without bleeding: Secondary | ICD-10-CM | POA: Diagnosis not present

## 2017-10-09 DIAGNOSIS — Z800 Family history of malignant neoplasm of digestive organs: Secondary | ICD-10-CM | POA: Diagnosis not present

## 2017-10-09 DIAGNOSIS — Z1211 Encounter for screening for malignant neoplasm of colon: Secondary | ICD-10-CM | POA: Diagnosis not present

## 2017-10-09 LAB — HM COLONOSCOPY

## 2017-10-10 ENCOUNTER — Telehealth: Payer: Self-pay

## 2017-10-10 DIAGNOSIS — E118 Type 2 diabetes mellitus with unspecified complications: Secondary | ICD-10-CM | POA: Diagnosis not present

## 2017-10-10 DIAGNOSIS — Z86711 Personal history of pulmonary embolism: Secondary | ICD-10-CM | POA: Diagnosis not present

## 2017-10-10 DIAGNOSIS — I10 Essential (primary) hypertension: Secondary | ICD-10-CM | POA: Diagnosis not present

## 2017-10-10 DIAGNOSIS — G629 Polyneuropathy, unspecified: Secondary | ICD-10-CM | POA: Diagnosis not present

## 2017-10-10 DIAGNOSIS — E0590 Thyrotoxicosis, unspecified without thyrotoxic crisis or storm: Secondary | ICD-10-CM | POA: Diagnosis not present

## 2017-10-10 NOTE — Telephone Encounter (Signed)
Left message for patient to call. Per Dr. Neldon Mc.... Please inform patient that sleep study showed mild sleep apnea. Pt stopped breathing 16 times per hour. Was there any pathway to further evaluate/ treat?

## 2017-10-14 ENCOUNTER — Other Ambulatory Visit (HOSPITAL_COMMUNITY): Payer: Self-pay | Admitting: Internal Medicine

## 2017-10-14 DIAGNOSIS — E0590 Thyrotoxicosis, unspecified without thyrotoxic crisis or storm: Secondary | ICD-10-CM

## 2017-10-14 NOTE — Telephone Encounter (Signed)
Spoke to patient results given. Patient states that sleep study center is not doing any further treatments. Dr Neldon Mc please advise

## 2017-10-15 ENCOUNTER — Other Ambulatory Visit: Payer: Self-pay

## 2017-10-15 ENCOUNTER — Ambulatory Visit (INDEPENDENT_AMBULATORY_CARE_PROVIDER_SITE_OTHER): Payer: Medicare Other | Admitting: Family Medicine

## 2017-10-15 ENCOUNTER — Encounter: Payer: Self-pay | Admitting: Family Medicine

## 2017-10-15 VITALS — BP 118/60 | HR 83 | Temp 97.9°F | Ht 64.0 in | Wt 228.2 lb

## 2017-10-15 DIAGNOSIS — Z1211 Encounter for screening for malignant neoplasm of colon: Secondary | ICD-10-CM | POA: Diagnosis not present

## 2017-10-15 DIAGNOSIS — E0840 Diabetes mellitus due to underlying condition with diabetic neuropathy, unspecified: Secondary | ICD-10-CM

## 2017-10-15 DIAGNOSIS — D126 Benign neoplasm of colon, unspecified: Secondary | ICD-10-CM | POA: Diagnosis not present

## 2017-10-15 DIAGNOSIS — E0590 Thyrotoxicosis, unspecified without thyrotoxic crisis or storm: Secondary | ICD-10-CM | POA: Diagnosis not present

## 2017-10-15 DIAGNOSIS — E0500 Thyrotoxicosis with diffuse goiter without thyrotoxic crisis or storm: Secondary | ICD-10-CM | POA: Insufficient documentation

## 2017-10-15 DIAGNOSIS — K5140 Inflammatory polyps of colon without complications: Secondary | ICD-10-CM | POA: Diagnosis not present

## 2017-10-15 DIAGNOSIS — B372 Candidiasis of skin and nail: Secondary | ICD-10-CM

## 2017-10-15 DIAGNOSIS — G4733 Obstructive sleep apnea (adult) (pediatric): Secondary | ICD-10-CM | POA: Diagnosis not present

## 2017-10-15 DIAGNOSIS — S92342G Displaced fracture of fourth metatarsal bone, left foot, subsequent encounter for fracture with delayed healing: Secondary | ICD-10-CM | POA: Diagnosis not present

## 2017-10-15 DIAGNOSIS — K635 Polyp of colon: Secondary | ICD-10-CM | POA: Diagnosis not present

## 2017-10-15 MED ORDER — NYSTATIN 100000 UNIT/GM EX CREA: 1.0000 "application " | TOPICAL_CREAM | Freq: Two times a day (BID) | CUTANEOUS | 1 refills | Status: DC

## 2017-10-15 NOTE — Assessment & Plan Note (Signed)
Likely causing rash on gluteal crease

## 2017-10-15 NOTE — Assessment & Plan Note (Signed)
Refer to sleep specialist.. Likely need full eval .

## 2017-10-15 NOTE — Assessment & Plan Note (Signed)
Possible new dx.. But repeat testing done today off biotin.Marland Kitchen Results pending. Await results, but if does look like true issue pt requests referral to a different ENDO for second opinion.

## 2017-10-15 NOTE — Progress Notes (Signed)
   Subjective:    Patient ID: [name], female    DOB: 05/13/1946, 72 y.o.   MRN: [id number]  HPI  72 year old female presents for follow up.   Recently issues with hot flashes  Recent TSH low, free t3 nml, free t4: high  Dx with hyperthyroidism. She has  Talked with her DM med.. ENDO.Marland Kitchen Recommended further testing, scan of thyroid.  May be due to Biotin She was confused about the recommendations and care.  She is requesting second opinion.  Also needs referral to  Sleep specialist given recent Dx of mild sleep apnea on home study. Likely needs full eval.  Itchy red rash in gluteal crease. Review of Systems  Constitutional: Negative for fatigue and fever.  HENT: Negative for ear pain.   Eyes: Negative for pain.  Respiratory: Negative for chest tightness and shortness of breath.   Cardiovascular: Negative for chest pain, palpitations and leg swelling.  Gastrointestinal: Negative for abdominal pain.  Genitourinary: Negative for dysuria.       Objective:   Physical Exam  Constitutional: Vital signs are normal. She appears well-developed and well-nourished. She is cooperative.  Non-toxic appearance. She does not appear ill. No distress.  HENT:  Head: Normocephalic.  Right Ear: Hearing, tympanic membrane, external ear and ear canal normal. Tympanic membrane is not erythematous, not retracted and not bulging.  Left Ear: Hearing, tympanic membrane, external ear and ear canal normal. Tympanic membrane is not erythematous, not retracted and not bulging.  Nose: No mucosal edema or rhinorrhea. Right sinus exhibits no maxillary sinus tenderness and no frontal sinus tenderness. Left sinus exhibits no maxillary sinus tenderness and no frontal sinus tenderness.  Mouth/Throat: Uvula is midline, oropharynx is clear and moist and mucous membranes are normal.  Eyes: Pupils are equal, round, and reactive to light. Conjunctivae, EOM and lids are normal. Lids are everted and swept, no foreign  bodies found.  Neck: Trachea normal and normal range of motion. Neck supple. Carotid bruit is not present. No thyroid mass and no thyromegaly present.  Cardiovascular: Normal rate, regular rhythm, S1 normal, S2 normal, normal heart sounds, intact distal pulses and normal pulses. Exam reveals no gallop and no friction rub.  No murmur heard. Pulmonary/Chest: Effort normal and breath sounds normal. No tachypnea. No respiratory distress. She has no decreased breath sounds. She has no wheezes. She has no rhonchi. She has no rales.  Abdominal: Soft. Normal appearance and bowel sounds are normal. There is no tenderness.  Neurological: She is alert.  Skin: Skin is warm, dry and intact. No rash noted.  Moist red rash in gluteal crease, no blisters, no pustules.  Psychiatric: Her speech is normal and behavior is normal. Judgment and thought content normal. Her mood appears not anxious. Cognition and memory are normal. She does not exhibit a depressed mood.          Assessment & Plan:

## 2017-10-15 NOTE — Patient Instructions (Addendum)
Please stop at the front desk to set up referral. Call once you receive results of recent thyroid testing to determine if you would like a second opinion referral ENDO

## 2017-10-16 ENCOUNTER — Encounter: Payer: Medicare Other | Admitting: Allergy and Immunology

## 2017-10-16 ENCOUNTER — Encounter: Payer: Self-pay | Admitting: Allergy and Immunology

## 2017-10-16 ENCOUNTER — Telehealth: Payer: Self-pay

## 2017-10-16 VITALS — BP 108/70 | HR 76 | Resp 20

## 2017-10-16 DIAGNOSIS — J4550 Severe persistent asthma, uncomplicated: Secondary | ICD-10-CM

## 2017-10-16 NOTE — Telephone Encounter (Signed)
Patient wanted to call and let you know that she is taking Myrbetriq 50 mg 1 tablet daily, and also Clindamycin 300 mg 1 tablet 4 times daily started by urologist because she states her urine was positive. Also she was told to start Methimazole 10 mg 1 tablet twice daily by her endocrinologist Dr Cristal Generous, but she wants to know if she should start this medication or to wait till she has her Nuclear thyroid study done on 10/31/17 in case medication masks her thyroid issue. Patient advised she may need to call endocrinology to ask that question but that I would send message back to see. Please review. Thank Edrick Kins, RMA

## 2017-10-17 DIAGNOSIS — M25512 Pain in left shoulder: Secondary | ICD-10-CM | POA: Diagnosis not present

## 2017-10-17 NOTE — Progress Notes (Signed)
This encounter was created in error - please disregard.

## 2017-10-17 NOTE — Telephone Encounter (Signed)
Let pt know that if recent labs test off biotin showed a true issue.. Needs to start the methimazole as ENDO instructed. If still with questions... Call ENDO.

## 2017-10-17 NOTE — Telephone Encounter (Signed)
Ms. Gherardi notified as instructed by telephone.  Medication list updated.

## 2017-10-18 ENCOUNTER — Telehealth: Payer: Self-pay

## 2017-10-18 NOTE — Telephone Encounter (Signed)
Patient states the insurance is requiring a prior authorization for pantoprazole. I attempted PA and was unable to submit it because patient does not have a prescription plan in her chart. I called and was unable o leave a message advising her of this.

## 2017-10-21 ENCOUNTER — Telehealth: Payer: Self-pay | Admitting: Allergy and Immunology

## 2017-10-21 DIAGNOSIS — J4550 Severe persistent asthma, uncomplicated: Secondary | ICD-10-CM

## 2017-10-21 NOTE — Telephone Encounter (Signed)
Unable to pull the media file up. [facility] is on Epic EHR system.

## 2017-10-21 NOTE — Telephone Encounter (Signed)
Called patient again and was not able to leave a message for her.

## 2017-10-21 NOTE — Telephone Encounter (Signed)
Per [facility]. They are able to access Epic and are part of [place].

## 2017-10-21 NOTE — Telephone Encounter (Signed)
Pt called and needs to have sleep study results sent to [facility]. Fax number [phone]. Because she is going to have another one done [phone] and

## 2017-10-21 NOTE — Telephone Encounter (Signed)
Please send scanned sleep study to neurologist. Hopefully it has already been scanned.

## 2017-10-22 ENCOUNTER — Ambulatory Visit (INDEPENDENT_AMBULATORY_CARE_PROVIDER_SITE_OTHER): Payer: Medicare Other | Admitting: *Deleted

## 2017-10-22 DIAGNOSIS — J4550 Severe persistent asthma, uncomplicated: Secondary | ICD-10-CM | POA: Diagnosis not present

## 2017-10-22 NOTE — Telephone Encounter (Signed)
I don't have any of the member info though.

## 2017-10-22 NOTE — Telephone Encounter (Signed)
Patient drug plan is with her [place]. She is not sure which plan is denying the pantoprazole.

## 2017-10-23 ENCOUNTER — Encounter: Payer: Self-pay | Admitting: Family Medicine

## 2017-10-31 ENCOUNTER — Encounter (HOSPITAL_COMMUNITY)
Admission: RE | Admit: 2017-10-31 | Discharge: 2017-10-31 | Disposition: A | Discharge status: Home / Self Care | Payer: Medicare Other | Source: Ambulatory Visit | Attending: Internal Medicine | Admitting: Internal Medicine

## 2017-10-31 DIAGNOSIS — E0590 Thyrotoxicosis, unspecified without thyrotoxic crisis or storm: Secondary | ICD-10-CM | POA: Insufficient documentation

## 2017-10-31 MED ORDER — SODIUM IODIDE I 131 CAPSULE
11.0000 | Freq: Once | INTRAVENOUS | Status: AC | PRN
  Administered 2017-10-31: 11 via ORAL

## 2017-11-01 ENCOUNTER — Encounter (HOSPITAL_COMMUNITY)
Admission: RE | Admit: 2017-11-01 | Discharge: 2017-11-01 | Disposition: A | Discharge status: Home / Self Care | Payer: Medicare Other | Source: Ambulatory Visit | Attending: Internal Medicine | Admitting: Internal Medicine

## 2017-11-01 DIAGNOSIS — E0590 Thyrotoxicosis, unspecified without thyrotoxic crisis or storm: Secondary | ICD-10-CM | POA: Insufficient documentation

## 2017-11-01 MED ORDER — SODIUM PERTECHNETATE TC 99M INJECTION
10.3200 | Freq: Once | INTRAVENOUS | Status: AC | PRN
  Administered 2017-11-01: 10.32 via INTRAVENOUS

## 2017-11-03 ENCOUNTER — Other Ambulatory Visit: Payer: Self-pay | Admitting: Allergy and Immunology

## 2017-11-05 DIAGNOSIS — S92342G Displaced fracture of fourth metatarsal bone, left foot, subsequent encounter for fracture with delayed healing: Secondary | ICD-10-CM | POA: Diagnosis not present

## 2017-11-05 DIAGNOSIS — M2042 Other hammer toe(s) (acquired), left foot: Secondary | ICD-10-CM | POA: Diagnosis not present

## 2017-11-06 ENCOUNTER — Other Ambulatory Visit: Payer: Self-pay | Admitting: Internal Medicine

## 2017-11-06 DIAGNOSIS — E118 Type 2 diabetes mellitus with unspecified complications: Secondary | ICD-10-CM | POA: Diagnosis not present

## 2017-11-06 DIAGNOSIS — E0590 Thyrotoxicosis, unspecified without thyrotoxic crisis or storm: Secondary | ICD-10-CM | POA: Diagnosis not present

## 2017-11-06 DIAGNOSIS — Z86711 Personal history of pulmonary embolism: Secondary | ICD-10-CM | POA: Diagnosis not present

## 2017-11-06 DIAGNOSIS — I10 Essential (primary) hypertension: Secondary | ICD-10-CM | POA: Diagnosis not present

## 2017-11-06 DIAGNOSIS — E041 Nontoxic single thyroid nodule: Secondary | ICD-10-CM

## 2017-11-06 DIAGNOSIS — E0500 Thyrotoxicosis with diffuse goiter without thyrotoxic crisis or storm: Secondary | ICD-10-CM | POA: Diagnosis not present

## 2017-11-06 DIAGNOSIS — G629 Polyneuropathy, unspecified: Secondary | ICD-10-CM | POA: Diagnosis not present

## 2017-11-08 ENCOUNTER — Telehealth: Payer: Self-pay | Admitting: Family Medicine

## 2017-11-08 DIAGNOSIS — E0590 Thyrotoxicosis, unspecified without thyrotoxic crisis or storm: Secondary | ICD-10-CM

## 2017-11-08 NOTE — Telephone Encounter (Signed)
Unable to reach pt by phone to get more info.

## 2017-11-08 NOTE — Telephone Encounter (Signed)
Please retry calling patient to get questions before the end of the day. She is seeing ENDO, I wonder if would be more appropriate to ask them about thyroid tests.

## 2017-11-08 NOTE — Telephone Encounter (Signed)
Copied from [place]. Topic: Quick Communication - See Telephone Encounter >> Nov 08, 2017 12:18 PM Rutherford Nail, NT wrote: CRM for notification. See Telephone encounter for: 11/08/17. Patient calling and wants to speak to Dr Diona Browner about her last thyroid test. States she has some questions. Pleasea dvise. CB#: [phone]

## 2017-11-08 NOTE — Telephone Encounter (Addendum)
Spoke with [name].  She states she ask for an appointment with [name] to discuss recent office visit with [name] but was told she didn't have anything available today.  Patient states she ask for appointment next week and was told they would just send a message and have [name] call her.  I scheduled patient an appointment on 10/16/17 at 10:45 am.  I will request office note from [name] office for [name] to review.  [name] is scheduled for a thyroid ultrasound on Tuesday as well at 2:30 pm at [facility].  Referral [name] put in for new endocrinologist on 10/15/2017 but it was auto closed and never was received by our referral schedulers per [facility]

## 2017-11-08 NOTE — Telephone Encounter (Signed)
Noted. I think it would be best as discussed to have appt with pt. Keep as scheduled.  [place]..  Can we still move forward with referral or do I need to re-order it? Pt wanted second opinion.

## 2017-11-11 NOTE — Telephone Encounter (Signed)
New Referral needs to be placed as the first one was placed and closed in error so we never received it and cant use it. Please put New Referral in Epic.

## 2017-11-12 ENCOUNTER — Ambulatory Visit
Admission: RE | Admit: 2017-11-12 | Discharge: 2017-11-12 | Disposition: A | Discharge status: Home / Self Care | Payer: Medicare Other | Source: Ambulatory Visit | Attending: Internal Medicine | Admitting: Internal Medicine

## 2017-11-12 ENCOUNTER — Encounter: Payer: Self-pay | Admitting: Family Medicine

## 2017-11-12 ENCOUNTER — Ambulatory Visit (INDEPENDENT_AMBULATORY_CARE_PROVIDER_SITE_OTHER): Payer: Medicare Other | Admitting: Family Medicine

## 2017-11-12 VITALS — BP 120/80 | HR 88 | Temp 98.3°F | Ht 64.0 in | Wt 226.0 lb

## 2017-11-12 DIAGNOSIS — J4541 Moderate persistent asthma with (acute) exacerbation: Secondary | ICD-10-CM | POA: Diagnosis not present

## 2017-11-12 DIAGNOSIS — E0590 Thyrotoxicosis, unspecified without thyrotoxic crisis or storm: Secondary | ICD-10-CM

## 2017-11-12 DIAGNOSIS — E041 Nontoxic single thyroid nodule: Secondary | ICD-10-CM

## 2017-11-12 NOTE — Assessment & Plan Note (Signed)
Mild worsening with symtpom of increase cough likely de to allergies.  Add antihistamine.  Continue on current meds in addition to flovent as recommended by allergist.

## 2017-11-12 NOTE — Progress Notes (Signed)
   Subjective:    Patient ID: [name], female    DOB: 09/19/1945, 72 y.o.   MRN: [id number]  HPI  72 year old female presents for questions about her thyroid.   Hyperthryroid.. She has seen endo but requests second opinion. She feels current ENDO Dr. Garnet Koyanagi has not explained things well.     She has noted cough in last week.  She  Noted sore throat and hoarseness after feeling like pills get stuck in trhao.   Using mucinex For post nasal drip, congestion. Using  Advair, spiriva singulair .Marland Kitchen Has added flovent in last day as instructed by D.r Kozlow.  HAs not required albuterol.  No fever,  occ productive clear mucus. No SOB.  HX of asthma She is sneezing some, itchy eyes. Using pataday.  Review of Systems  Constitutional: Positive for fatigue.  HENT: Negative for ear pain.   Eyes: Negative for pain.  Respiratory: Negative for chest tightness.   Cardiovascular: Negative for palpitations and leg swelling.  Gastrointestinal: Negative for abdominal pain.       Objective:   Physical Exam  Constitutional: Vital signs are normal. She appears well-developed and well-nourished. She is cooperative.  Non-toxic appearance. She does not appear ill. No distress.  HENT:  Head: Normocephalic.  Right Ear: Hearing, tympanic membrane, external ear and ear canal normal. Tympanic membrane is not erythematous, not retracted and not bulging.  Left Ear: Hearing, tympanic membrane, external ear and ear canal normal. Tympanic membrane is not erythematous, not retracted and not bulging.  Nose: No mucosal edema or rhinorrhea. Right sinus exhibits no maxillary sinus tenderness and no frontal sinus tenderness. Left sinus exhibits no maxillary sinus tenderness and no frontal sinus tenderness.  Mouth/Throat: Uvula is midline, oropharynx is clear and moist and mucous membranes are normal.  Eyes: Pupils are equal, round, and reactive to light. Conjunctivae, EOM and lids are normal. Lids are everted and  swept, no foreign bodies found.  Neck: Trachea normal and normal range of motion. Neck supple. Carotid bruit is not present. No thyroid mass and no thyromegaly present.  Cardiovascular: Normal rate, regular rhythm, S1 normal, S2 normal, normal heart sounds, intact distal pulses and normal pulses. Exam reveals no gallop and no friction rub.  No murmur heard. Pulmonary/Chest: Effort normal and breath sounds normal. No tachypnea. No respiratory distress. She has no decreased breath sounds. She has no wheezes. She has no rhonchi. She has no rales.  Abdominal: Soft. Normal appearance and bowel sounds are normal. There is no tenderness.  Neurological: She is alert.  Skin: Skin is warm, dry and intact. No rash noted.  Psychiatric: Her speech is normal and behavior is normal. Judgment and thought content normal. Her mood appears not anxious. Cognition and memory are normal. She does not exhibit a depressed mood.          Assessment & Plan:

## 2017-11-12 NOTE — Addendum Note (Signed)
Addended by: [name] E on: 11/12/2017 08:39 AM   Modules accepted: Orders

## 2017-11-12 NOTE — Patient Instructions (Addendum)
Please stop at the front desk to set up referral.  Hold any medication for now unless plan on seeing Dr. Lorine Bears.  Continue the flovent. Try addition of zyrtec at bedtime for possible allergy trigger.  Can use albuterol as needed.  If not improving as exepcted with cough ir increase in shortness of breath.. Follow up with Dr. Neldon Mc. If severe shortness of breath got to ER.

## 2017-11-12 NOTE — Assessment & Plan Note (Signed)
Reviewed labs with pt. Recommendations from ENDO appropriate.  She is adamant about second opinion as she was not happy with recent ENDO visits. Referral placed.

## 2017-11-13 ENCOUNTER — Ambulatory Visit (INDEPENDENT_AMBULATORY_CARE_PROVIDER_SITE_OTHER): Payer: Medicare Other

## 2017-11-13 ENCOUNTER — Ambulatory Visit (HOSPITAL_COMMUNITY)
Admission: EM | Admit: 2017-11-13 | Discharge: 2017-11-13 | Disposition: A | Discharge status: Home / Self Care | Payer: Medicare Other | Attending: Urgent Care | Admitting: Urgent Care

## 2017-11-13 ENCOUNTER — Encounter (HOSPITAL_COMMUNITY): Payer: Self-pay | Admitting: Emergency Medicine

## 2017-11-13 ENCOUNTER — Other Ambulatory Visit: Payer: Self-pay

## 2017-11-13 DIAGNOSIS — E119 Type 2 diabetes mellitus without complications: Secondary | ICD-10-CM | POA: Diagnosis not present

## 2017-11-13 DIAGNOSIS — M25552 Pain in left hip: Secondary | ICD-10-CM | POA: Diagnosis not present

## 2017-11-13 DIAGNOSIS — M1612 Unilateral primary osteoarthritis, left hip: Secondary | ICD-10-CM

## 2017-11-13 DIAGNOSIS — R102 Pelvic and perineal pain: Secondary | ICD-10-CM | POA: Diagnosis not present

## 2017-11-13 LAB — POCT URINALYSIS DIP (DEVICE)
Bilirubin Urine: NEGATIVE
Glucose, UA: >=1000 mg/dL — AB
Hgb urine dipstick: NEGATIVE
Ketones, ur: NEGATIVE mg/dL
Leukocytes, UA: NEGATIVE
Nitrite: NEGATIVE
Protein, ur: NEGATIVE mg/dL
Specific Gravity, Urine: 1.01 (ref 1.005–1.030)
Urobilinogen, UA: 0.2 mg/dL (ref 0.0–1.0)
pH: 7 (ref 5.0–8.0)

## 2017-11-13 MED ORDER — PREDNISONE 20 MG PO TABS: ORAL_TABLET | ORAL | 0 refills | Status: DC

## 2017-11-13 MED ORDER — FLUCONAZOLE 150 MG PO TABS: 150.0000 mg | ORAL_TABLET | ORAL | 0 refills | Status: DC

## 2017-11-13 NOTE — ED Provider Notes (Signed)
MRN: [id number] DOB: Jun 22, 1946  Subjective:   [name] is a 72 y.o. female presenting for 1 day history of constant, severe sharp left hip pain that worsens with standing or walking. Has not tried any medications for relief. Denies falls, trauma, heavy lifting, dysuria, hematuria. Patient has diabetes, blood sugar readings range between 110-130. She admits urinary frequency as a result. Also has difficulty with constipation in the past. She uses Miralax for this. Last BM was last night, was regular, did not strain or have painful defecation.     Current Facility-Administered Medications:  .  omalizumab Arvid Right) injection 300 mg, 300 mg, Subcutaneous, Q28 days, Kozlow, Donnamarie Poag, MD, 300 mg at 10/22/17 1025  Current Outpatient Medications:  .  ADVAIR HFA 706-23 MCG/ACT inhaler, INHALE 2 PUFFS BY MOUTH TWICE DAILY, Disp: 12 g, Rfl: 3 .  albuterol (PROVENTIL HFA) 108 (90 BASE) MCG/ACT inhaler, Inhale 2 puffs into the lungs every 6 (six) hours as needed for wheezing or shortness of breath., Disp: 1 Inhaler, Rfl: 0 .  azelastine (ASTELIN) 0.1 % nasal spray, USE 1-2 SPRAYS IN EACH NOSTRIL TWICE DAILY AS NEEDED, Disp: 30 mL, Rfl: 5 .  cyclobenzaprine (FLEXERIL) 10 MG tablet, Take 1 tablet (10 mg total) by mouth 2 (two) times daily as needed for muscle spasms., Disp: 15 tablet, Rfl: 0 .  diltiazem (CARTIA XT) 120 MG 24 hr capsule, Take 1 capsule (120 mg total) by mouth daily. KEEP OV., Disp: 30 capsule, Rfl: 2 .  DULoxetine (CYMBALTA) 30 MG capsule, Take 30 mg by mouth daily., Disp: , Rfl:  .  Empagliflozin-Linagliptin (GLYXAMBI) 10-5 MG TABS, Take 1 tablet by mouth daily., Disp: , Rfl:  .  EPINEPHrine 0.3 mg/0.3 mL IJ SOAJ injection, USE AS DIRECTED FOR LIFE THREATENING ALLERGIC REACTIONS, Disp: 2 Device, Rfl: 2 .  fluticasone (FLOVENT HFA) 220 MCG/ACT inhaler, Inhale 2 puffs into the lungs 2 (two) times daily as needed., Disp: 1 Inhaler, Rfl: 5 .  furosemide (LASIX) 40 MG tablet, TAKE 1 TABLET(40  MG) BY MOUTH DAILY, Disp: 30 tablet, Rfl: 3 .  gabapentin (NEURONTIN) 300 MG capsule, Take 600 mg by mouth 3 (three) times daily., Disp: , Rfl: 6 .  irbesartan (AVAPRO) 150 MG tablet, TAKE 1 TABLET(150 MG) BY MOUTH DAILY, Disp: 30 tablet, Rfl: 9 .  Lactase (DAIRY-RELIEF PO), Take 1 capsule by mouth daily as needed (stomach upset). , Disp: , Rfl:  .  levalbuterol (XOPENEX) 1.25 MG/3ML nebulizer solution, Take 1.25 mg by nebulization every 4 (four) hours as needed for wheezing., Disp: 150 mL, Rfl: 0 .  Magnesium 250 MG TABS, Take 1 tablet by mouth daily., Disp: , Rfl:  .  methimazole (TAPAZOLE) 10 MG tablet, Take 10 mg by mouth 2 (two) times daily., Disp: , Rfl:  .  mirabegron ER (MYRBETRIQ) 50 MG TB24 tablet, Take 50 mg by mouth daily., Disp: , Rfl:  .  montelukast (SINGULAIR) 10 MG tablet, TAKE 1 TABLET BY MOUTH EVERY DAY, Disp: 30 tablet, Rfl: 1 .  nystatin cream (MYCOSTATIN), Apply 1 application topically 2 (two) times daily. Continue 48 hours after rash resolved., Disp: 30 g, Rfl: 1 .  ONETOUCH DELICA LANCETS 76E MISC, as needed (blood check). , Disp: , Rfl:  .  ONETOUCH VERIO test strip, 1 each by Other route as needed (blood monitoring). , Disp: , Rfl:  .  pantoprazole (PROTONIX) 40 MG tablet, TAKE 1 TABLET BY MOUTH EVERY DAY, Disp: 90 tablet, Rfl: 1 .  PATADAY 0.2 % SOLN,  INT 1 GTT IN OU QD PRN, Disp: , Rfl: 4 .  polyethylene glycol (MIRALAX / GLYCOLAX) packet, Take 17 g by mouth daily., Disp: , Rfl:  .  Probiotic Product (ALIGN) 4 MG CAPS, Take 1 capsule by mouth daily., Disp: , Rfl:  .  ranitidine (ZANTAC) 300 MG tablet, TAKE 1 TABLET BY MOUTH EVERY NIGHT AT BEDTIME, Disp: 90 tablet, Rfl: 0 .  SPIRIVA RESPIMAT 1.25 MCG/ACT AERS, INHALE 2 PUFFS BY MOUTH EVERY DAY, Disp: 4 g, Rfl: 3 .  SUMAtriptan (IMITREX) 100 MG tablet, Take 100 mg x 1 may, repeat in 2 hour for migraine., Disp: 10 tablet, Rfl: 0 .  SUPER B COMPLEX/C PO, Take 1 tablet by mouth daily., Disp: , Rfl:  .  traMADol (ULTRAM) 50  MG tablet, Take 1 tablet (50 mg total) by mouth every 8 (eight) hours as needed., Disp: 30 tablet, Rfl: 0 .  XARELTO 20 MG TABS tablet, TAKE 1 TABLET(20 MG) BY MOUTH DAILY WITH SUPPER, Disp: 90 tablet, Rfl: 1    Allergies  Allergen Reactions  . Lyrica [Pregabalin] Other (See Comments)    Blurred vision.  Marland Kitchen Penicillins Swelling    Has patient had a PCN reaction causing immediate rash, facial/tongue/throat swelling, SOB or lightheadedness with hypotension patient had a PCN reaction causing severe rash involving mucus membranes or skin necrosis: [id number]} Has patient had a PCN reaction that required hospitalization/No Has patient had a PCN reaction occurring within the last 10 years: No If all of the above answers are "NO", then may proceed with Cephalosporin use.     Marland Kitchen Phenytoin Sodium Extended Swelling    Swelling   . Vicodin [Hydrocodone-Acetaminophen] Nausea And Vomiting    Past Medical History:  Diagnosis Date  . Allergic rhinitis   . Allergy   . Arthritis   . Asthma   . Chronic headache   . Diabetes mellitus   . DVT (deep venous thrombosis) ([place])   . Dyspnea   . Heart murmur   . Hypertension   . Pulmonary embolism ([place])   . Pulmonary hypertension ([place])   . Seizures ([employer])      Past Surgical History:  Procedure Laterality Date  . ABDOMINAL HYSTERECTOMY     partial, has ovaries  . CARDIAC CATHETERIZATION  12/28/2010   Mod. pulmonary hypertension, normal coronary arteries  . DOPPLER ECHOCARDIOGRAPHY  10/08/2011   EF=>55%,mild asymmetric LVH, mod. TR, mod. PH, mild to mod LA dilatation  . KNEE ARTHROSCOPY Left   . KNEE SURGERY    . Nuclear Stress Test  05/20/2006   No ischemia  . PARTIAL HYSTERECTOMY    . PLANTAR FASCIA SURGERY    . TONSILLECTOMY      Objective:   Vitals: BP 130/69 (BP Location: Left Arm) Comment (BP Location): large cuff  Pulse 84   Temp 97.7 F (36.5 C) (Oral)   Resp 18   SpO2 99%   Physical Exam  Constitutional: She is oriented to  person, place, and time. She appears well-developed and well-nourished.  Cardiovascular: Normal rate.  Pulmonary/Chest: Effort normal.  Musculoskeletal:       Left hip: She exhibits decreased range of motion, decreased strength and tenderness (over area depicted). She exhibits no swelling, no crepitus and no deformity.       Lumbar back: She exhibits decreased range of motion (full flexion, extension). She exhibits no tenderness, no bony tenderness, no swelling, no edema, no deformity, no laceration and no spasm.       Legs: Neurological:  She is alert and oriented to person, place, and time.  Skin: Skin is warm and dry.  Psychiatric: She has a normal mood and affect.   Dg Pelvis 1-2 Views  Result Date: 11/13/2017 CLINICAL DATA:  Pain EXAM: PELVIS - 1-2 VIEW COMPARISON:  None. FINDINGS: There is no evidence of pelvic fracture or dislocation. There is slight symmetric narrowing of both hip joints. No erosive change. There is sclerosis in the pubic symphysis region. There are calcifications in the right pelvis of uncertain etiology. IMPRESSION: Mild symmetric narrowing both hip joints. Sclerosis in the pubic symphysis region. No fracture or dislocation. Calcifications in the right pelvis noted, of uncertain etiology. Patient is status post hysterectomy. Electronically Signed   By: Lowella Grip III M.D.   On: 11/13/2017 13:58   US Thyroid  Result Date: 11/13/2017 CLINICAL DATA:  Left inferior cold nodule by nuclear medicine thyroid scan EXAM: THYROID ULTRASOUND TECHNIQUE: Ultrasound examination of the thyroid gland and adjacent soft tissues was performed. COMPARISON:  11/01/2017 FINDINGS: Parenchymal Echotexture: Mildly heterogenous Isthmus: 3 mm Right lobe: 4.1 x 1.2 x 1.5 cm Left lobe: 6.1 x 2.1 x 2.3 cm _________________________________________________________ Estimated total number of nodules >/= 1 cm: 1 Number of spongiform nodules >/=  2 cm not described below (TR1): 0 Number of mixed cystic  and solid nodules >/= 1.5 cm not described below ([place]): 0 _________________________________________________________ Nodule # 1: Location: Left; Inferior Maximum size: 3.2 cm; Other 2 dimensions: 2.0 x 2.5 cm Composition: solid/almost completely solid (2) Echogenicity: isoechoic (1) Shape: not taller-than-wide (0) Margins: ill-defined (0) Echogenic foci: none (0) ACR TI-RADS total points: 3. ACR TI-RADS risk category: TR3 (3 points). ACR TI-RADS recommendations: **Given size (>/= 2.5 cm) and appearance, fine needle aspiration of this mildly suspicious nodule should be considered based on TI-RADS criteria. _________________________________________________________ No adenopathy. Right lobe subcentimeter hypoechoic nodules noted all measuring 4 mm or less in size. These would not meet criteria for any biopsy or follow-up. IMPRESSION: 3.2 cm left inferior TR 3 nodule correlates with the cold nodule by nuclear medicine scan. This nodule meets criteria for biopsy as above. The above is in keeping with the ACR TI-RADS recommendations - J Am Coll Radiol 2017;14:587-595. Electronically Signed   By: Jerilynn Mages.  Shick M.D.   On: 11/13/2017 08:45   Assessment and Plan :   Left hip pain  Arthritis of left hip  Controlled type 2 diabetes mellitus without complication, without long-term current use of insulin (HCC)  Start prednisone for severe arthritic hip pain. Counseled patient on potential for adverse effects with medications prescribed today, patient verbalized understanding. Return-to-clinic precautions discussed, patient verbalized understanding. She is otherwise to set up a follow up appointment with her orthopedist.   Jaynee Eagles, PA-C 11/13/17 1416

## 2017-11-13 NOTE — ED Triage Notes (Signed)
Left groin pain, non-radiating pain.  Sudden onset last night.

## 2017-11-15 DIAGNOSIS — M1711 Unilateral primary osteoarthritis, right knee: Secondary | ICD-10-CM | POA: Diagnosis not present

## 2017-11-15 DIAGNOSIS — M1612 Unilateral primary osteoarthritis, left hip: Secondary | ICD-10-CM | POA: Diagnosis not present

## 2017-11-18 DIAGNOSIS — J4550 Severe persistent asthma, uncomplicated: Secondary | ICD-10-CM | POA: Diagnosis not present

## 2017-11-19 ENCOUNTER — Telehealth: Payer: Self-pay

## 2017-11-19 ENCOUNTER — Encounter: Payer: Self-pay | Admitting: Cardiovascular Disease

## 2017-11-19 ENCOUNTER — Ambulatory Visit (INDEPENDENT_AMBULATORY_CARE_PROVIDER_SITE_OTHER): Payer: Medicare Other | Admitting: *Deleted

## 2017-11-19 ENCOUNTER — Other Ambulatory Visit: Payer: Self-pay | Admitting: Internal Medicine

## 2017-11-19 DIAGNOSIS — J4550 Severe persistent asthma, uncomplicated: Secondary | ICD-10-CM | POA: Diagnosis not present

## 2017-11-19 DIAGNOSIS — E041 Nontoxic single thyroid nodule: Secondary | ICD-10-CM

## 2017-11-19 NOTE — Telephone Encounter (Signed)
   [facility] Pre-operative Risk Assessment    Request for surgical clearance:  1. What type of surgery is being performed? Thyroid Biopsy   2. When is this surgery scheduled? TBD  3. What type of clearance is required (medical clearance vs. Pharmacy clearance to hold med vs. Both)? Both  4. Are there any medications that need to be held prior to surgery and how long? Xarelto 1 day  5. Practice name and name of physician performing surgery?  [facility]  6. What is your office phone number [phone]   7.   What is your office fax number336 [phone]  8.   Anesthesia type (None, local, MAC, general) ? Unknown    Meryl Crutch 11/19/2017, 5:38 PM  _________________________________________________________________   (provider comments below)

## 2017-11-21 DIAGNOSIS — E0590 Thyrotoxicosis, unspecified without thyrotoxic crisis or storm: Secondary | ICD-10-CM | POA: Diagnosis not present

## 2017-11-22 DIAGNOSIS — N3941 Urge incontinence: Secondary | ICD-10-CM | POA: Diagnosis not present

## 2017-11-22 DIAGNOSIS — R351 Nocturia: Secondary | ICD-10-CM | POA: Diagnosis not present

## 2017-11-22 NOTE — Telephone Encounter (Addendum)
Addendum: Pt able to perform 5-6 METS without any angina (mobility was limited earlier this year due to foot injury and boot is now off and she feels she is progressing nicely). Dayna Dunn PA-C

## 2017-11-22 NOTE — Telephone Encounter (Signed)
   Primary Cardiologist: Sanda Klein, MD  Chart reviewed as part of pre-operative protocol coverage. Patient was contacted 11/22/2017 in reference to pre-operative risk assessment for pending surgery as outlined below.  [name] was last seen on 05/2017 by [name] PA-C. She was cleared to stop her Xarelto for 1 day before colonoscopy in 09/2017 and did so without complication. Since that day, [name] has done well without any new cardiac symptoms. Therefore, based on ACC/AHA guidelines, the patient would be at acceptable risk for the planned procedure without further cardiovascular testing.   Following prior pharmacist recommendations regarding anticoagulation, "Pt takes Xarelto afib with CHADS2VASc score of 4 (age, sex, HTN, DM). Also has hx of PE in 2004 and DVT (noted in [place] in 2015, unclear of DVT date). She has had workup for hypercoagulable conditions(lupus anticoagulant positive on one of 2 separate assays, protein S activity decreased with normal total protein S level).Renal function is normal. Ok to hold Xarelto for 1 dose prior to procedure and resume as soon as possible after due to hypercoagulability history."  I will route this recommendation to the requesting party via [place] fax function and remove from pre-op pool.  Please call with questions.  Charlie Pitter, PA-C 11/22/2017, 2:41 PM

## 2017-11-26 DIAGNOSIS — E0500 Thyrotoxicosis with diffuse goiter without thyrotoxic crisis or storm: Secondary | ICD-10-CM | POA: Diagnosis not present

## 2017-11-26 DIAGNOSIS — G629 Polyneuropathy, unspecified: Secondary | ICD-10-CM | POA: Diagnosis not present

## 2017-11-26 DIAGNOSIS — E041 Nontoxic single thyroid nodule: Secondary | ICD-10-CM | POA: Diagnosis not present

## 2017-11-26 DIAGNOSIS — E118 Type 2 diabetes mellitus with unspecified complications: Secondary | ICD-10-CM | POA: Diagnosis not present

## 2017-11-26 DIAGNOSIS — E0590 Thyrotoxicosis, unspecified without thyrotoxic crisis or storm: Secondary | ICD-10-CM | POA: Diagnosis not present

## 2017-11-26 DIAGNOSIS — I10 Essential (primary) hypertension: Secondary | ICD-10-CM | POA: Diagnosis not present

## 2017-11-26 DIAGNOSIS — S92342K Displaced fracture of fourth metatarsal bone, left foot, subsequent encounter for fracture with nonunion: Secondary | ICD-10-CM | POA: Diagnosis not present

## 2017-11-26 DIAGNOSIS — Z86711 Personal history of pulmonary embolism: Secondary | ICD-10-CM | POA: Diagnosis not present

## 2017-12-03 ENCOUNTER — Ambulatory Visit (INDEPENDENT_AMBULATORY_CARE_PROVIDER_SITE_OTHER): Payer: Medicare Other | Admitting: Allergy and Immunology

## 2017-12-03 ENCOUNTER — Encounter: Payer: Self-pay | Admitting: Allergy and Immunology

## 2017-12-03 ENCOUNTER — Ambulatory Visit (INDEPENDENT_AMBULATORY_CARE_PROVIDER_SITE_OTHER): Payer: Medicare Other | Admitting: Neurology

## 2017-12-03 ENCOUNTER — Encounter: Payer: Self-pay | Admitting: Neurology

## 2017-12-03 VITALS — BP 131/74 | HR 50 | Ht 65.0 in | Wt 228.0 lb

## 2017-12-03 VITALS — BP 152/60 | HR 76 | Resp 16

## 2017-12-03 DIAGNOSIS — I10 Essential (primary) hypertension: Secondary | ICD-10-CM

## 2017-12-03 DIAGNOSIS — Z6841 Body Mass Index (BMI) 40.0 and over, adult: Secondary | ICD-10-CM | POA: Diagnosis not present

## 2017-12-03 DIAGNOSIS — M47817 Spondylosis without myelopathy or radiculopathy, lumbosacral region: Secondary | ICD-10-CM

## 2017-12-03 DIAGNOSIS — I480 Paroxysmal atrial fibrillation: Secondary | ICD-10-CM | POA: Diagnosis not present

## 2017-12-03 DIAGNOSIS — J3089 Other allergic rhinitis: Secondary | ICD-10-CM | POA: Diagnosis not present

## 2017-12-03 DIAGNOSIS — G4726 Circadian rhythm sleep disorder, shift work type: Secondary | ICD-10-CM | POA: Diagnosis not present

## 2017-12-03 DIAGNOSIS — E661 Drug-induced obesity: Secondary | ICD-10-CM | POA: Diagnosis not present

## 2017-12-03 DIAGNOSIS — F5104 Psychophysiologic insomnia: Secondary | ICD-10-CM

## 2017-12-03 DIAGNOSIS — J4540 Moderate persistent asthma, uncomplicated: Secondary | ICD-10-CM | POA: Diagnosis not present

## 2017-12-03 DIAGNOSIS — G4733 Obstructive sleep apnea (adult) (pediatric): Secondary | ICD-10-CM

## 2017-12-03 DIAGNOSIS — H6982 Other specified disorders of Eustachian tube, left ear: Secondary | ICD-10-CM | POA: Diagnosis not present

## 2017-12-03 DIAGNOSIS — J4550 Severe persistent asthma, uncomplicated: Secondary | ICD-10-CM

## 2017-12-03 DIAGNOSIS — K219 Gastro-esophageal reflux disease without esophagitis: Secondary | ICD-10-CM

## 2017-12-03 DIAGNOSIS — R0683 Snoring: Secondary | ICD-10-CM | POA: Diagnosis not present

## 2017-12-03 NOTE — Patient Instructions (Addendum)
  1. Continue Advair 230 - 2 inhalations twice a day  2. Continue Flovent 220 - 2 inhalations twice a day added to Advair during "FLARE UP"  3. Continue Spiriva 2.5 respimat - two inhalations one time per day.  4. Continue pantoprazole 40mg  in the AM and Ranitidine 300mg  in the PM  5. Continue Xolair and Epi-Pen  6. Continue Montelukast 10mg  - one tablet one time per day.  7. If needed:   A. Nasal saline several times per day  B. Mucinex DM two times per day  C. nasal azelastine two sprays each nostril 1-2 times per day  D. Proventil HFA or albuterol nebulization  8. Use prednisone 10mg  one time per day for 5 days only. Check blood sugars.  9. Further treatment?  10. Return to clinic in 12 weeks or earlier if there is a problem.

## 2017-12-03 NOTE — Progress Notes (Signed)
Follow-up Note  Referring Provider: [name], MD Primary Provider: [name], MD Date of Office Visit: 12/03/2017  Subjective:   [name] (DOB: [birth year]) is a 72 y.o. female who returns to the [facility] on 12/03/2017 in re-evaluation of the following:  HPI: [name] returns to this clinic in reevaluation of asthma and allergic rhinitis and LPR and her history of pulmonary hypertension and diastolic dysfunction.  Her last visit to this clinic was 24 September 2017 at which point time she was doing wonderful regarding all of her issues on a large collection of medical treatment including Xolair.  Unfortunately, after visiting [place] to participate in her daughter's graduation she developed acute onset of sore throat and left ear pain and runny nose without any significant lower airway symptoms or fever or ugly nasal discharge or inability to smell over the course of the past 24 to 48 hours.  Allergies as of 12/03/2017      Reactions   Latex Hives   Lyrica [pregabalin] Other (See Comments)   Blurred vision.   Penicillins Swelling   Has patient had a PCN reaction causing immediate rash, facial/tongue/throat swelling, SOB or lightheadedness with hypotension patient had a PCN reaction causing severe rash involving mucus membranes or skin necrosis: [id number] Has patient had a PCN reaction that required hospitalization/No Has patient had a PCN reaction occurring within the last 10 years: No If all of the above answers are "NO", then may proceed with Cephalosporin use.   Phenytoin Sodium Extended Swelling   Swelling    Vicodin [hydrocodone-acetaminophen] Nausea And Vomiting      Medication List      ADVAIR HFA 976-73 MCG/ACT inhaler Generic drug:  fluticasone-salmeterol INHALE 2 PUFFS BY MOUTH TWICE DAILY   albuterol 108 (90 Base) MCG/ACT inhaler Commonly known as:  PROVENTIL HFA Inhale 2 puffs into the lungs every 6 (six) hours as needed for  wheezing or shortness of breath.   ALIGN 4 MG Caps Take 1 capsule by mouth daily.   azelastine 0.1 % nasal spray Commonly known as:  ASTELIN USE 1-2 SPRAYS IN EACH NOSTRIL TWICE DAILY AS NEEDED   cyclobenzaprine 10 MG tablet Commonly known as:  FLEXERIL Take 1 tablet (10 mg total) by mouth 2 (two) times daily as needed for muscle spasms.   DAIRY-RELIEF PO Take 1 capsule by mouth daily as needed (stomach upset).   diltiazem 120 MG 24 hr capsule Commonly known as:  CARTIA XT Take 1 capsule (120 mg total) by mouth daily. KEEP OV.   DULoxetine 30 MG capsule Commonly known as:  CYMBALTA Take 30 mg by mouth daily.   EPINEPHrine 0.3 mg/0.3 mL Soaj injection Commonly known as:  EPI-PEN USE AS DIRECTED FOR LIFE THREATENING ALLERGIC REACTIONS   fluconazole 150 MG tablet Commonly known as:  DIFLUCAN Take 1 tablet (150 mg total) by mouth once a week.   fluticasone 220 MCG/ACT inhaler Commonly known as:  FLOVENT HFA Inhale 2 puffs into the lungs 2 (two) times daily as needed.   furosemide 40 MG tablet Commonly known as:  LASIX TAKE 1 TABLET(40 MG) BY MOUTH DAILY   gabapentin 300 MG capsule Commonly known as:  NEURONTIN Take 600 mg by mouth 3 (three) times daily.   GLYXAMBI 10-5 MG Tabs Generic drug:  Empagliflozin-linaGLIPtin Take 1 tablet by mouth daily.   hydrOXYzine 10 MG tablet Commonly known as:  ATARAX/VISTARIL TK 1 TO 2 TS PO HS   irbesartan 150 MG  tablet Commonly known as:  AVAPRO TAKE 1 TABLET(150 MG) BY MOUTH DAILY   levalbuterol 1.25 MG/3ML nebulizer solution Commonly known as:  XOPENEX Take 1.25 mg by nebulization every 4 (four) hours as needed for wheezing.   Magnesium 250 MG Tabs Take 1 tablet by mouth daily.   methimazole 10 MG tablet Commonly known as:  TAPAZOLE Take 10 mg by mouth 2 (two) times daily.   montelukast 10 MG tablet Commonly known as:  SINGULAIR TAKE 1 TABLET BY MOUTH EVERY DAY   MYRBETRIQ 50 MG Tb24 tablet Generic drug:   mirabegron ER Take 50 mg by mouth daily.   ONETOUCH DELICA LANCETS 66Q Misc as needed (blood check).   ONETOUCH VERIO test strip Generic drug:  glucose blood 1 each by Other route as needed (blood monitoring).   pantoprazole 40 MG tablet Commonly known as:  PROTONIX TAKE 1 TABLET BY MOUTH EVERY DAY   PATADAY 0.2 % Soln Generic drug:  Olopatadine HCl INT 1 GTT IN OU QD PRN   polyethylene glycol packet Commonly known as:  MIRALAX / GLYCOLAX Take 17 g by mouth daily.   ranitidine 300 MG tablet Commonly known as:  ZANTAC TAKE 1 TABLET BY MOUTH EVERY NIGHT AT BEDTIME   SPIRIVA RESPIMAT 1.25 MCG/ACT Aers Generic drug:  Tiotropium Bromide Monohydrate INHALE 2 PUFFS BY MOUTH EVERY DAY   SUMAtriptan 100 MG tablet Commonly known as:  IMITREX Take 100 mg x 1 may, repeat in 2 hour for migraine.   SUPER B COMPLEX/C PO Take 1 tablet by mouth daily.   traMADol 50 MG tablet Commonly known as:  ULTRAM Take 1 tablet (50 mg total) by mouth every 8 (eight) hours as needed.   XARELTO 20 MG Tabs tablet Generic drug:  rivaroxaban TAKE 1 TABLET(20 MG) BY MOUTH DAILY WITH SUPPER       Past Medical History:  Diagnosis Date  . Allergic rhinitis   . Allergy   . Arthritis   . Asthma   . Chronic headache   . Diabetes mellitus   . DVT (deep venous thrombosis) ([place])   . Dyspnea   . Heart murmur   . Hypertension   . Pulmonary embolism ([place])   . Pulmonary hypertension ([place])   . Seizures ([place])     Past Surgical History:  Procedure Laterality Date  . ABDOMINAL HYSTERECTOMY     partial, has ovaries  . CARDIAC CATHETERIZATION  12/28/2010   Mod. pulmonary hypertension, normal coronary arteries  . DOPPLER ECHOCARDIOGRAPHY  10/08/2011   EF=>55%,mild asymmetric LVH, mod. TR, mod. PH, mild to mod LA dilatation  . KNEE ARTHROSCOPY Left   . KNEE SURGERY    . Nuclear Stress Test  05/20/2006   No ischemia  . PARTIAL HYSTERECTOMY    . PLANTAR FASCIA SURGERY    . TONSILLECTOMY       Review of systems negative except as noted in HPI / PMHx or noted below:  Review of Systems  Constitutional: Negative.   HENT: Negative.   Eyes: Negative.   Respiratory: Negative.   Cardiovascular: Negative.   Gastrointestinal: Negative.   Genitourinary: Negative.   Musculoskeletal: Negative.   Skin: Negative.   Neurological: Negative.   Endo/Heme/Allergies: Negative.   Psychiatric/Behavioral: Negative.      Objective:   Vitals:   12/03/17 1650  BP: (!) 152/60  Pulse: 76  Resp: 16          Physical Exam  HENT:  Head: Normocephalic.  Right Ear: Tympanic membrane, external ear  and ear canal normal.  Left Ear: External ear and ear canal normal. Tympanic membrane is erythematous. A middle ear effusion is present.  Nose: Nose normal. No mucosal edema or rhinorrhea.  Mouth/Throat: Uvula is midline and mucous membranes are normal. Posterior oropharyngeal erythema present. No oropharyngeal exudate.  Eyes: Conjunctivae are normal.  Neck: Trachea normal. No tracheal tenderness present. No tracheal deviation present. No thyromegaly present.  Cardiovascular: Normal rate, regular rhythm, S1 normal, S2 normal and normal heart sounds.  No murmur heard. Pulmonary/Chest: Breath sounds normal. No stridor. No respiratory distress. She has no wheezes. She has no rales.  Musculoskeletal: She exhibits no edema.  Lymphadenopathy:       Head (right side): No tonsillar adenopathy present.       Head (left side): No tonsillar adenopathy present.    She has no cervical adenopathy.  Neurological: She is alert.  Skin: No rash noted. She is not diaphoretic. No erythema. Nails show no clubbing.    Diagnostics:    Spirometry was performed and demonstrated an FEV1 of 1.55 at 80 % of predicted.  The patient had an Asthma Control Test with the following results: ACT Total Score: 24.    Assessment and Plan:   1. Asthma, severe persistent, well-controlled   2. Other allergic rhinitis    3. LPRD (laryngopharyngeal reflux disease)   4. ETD (Eustachian tube dysfunction), left     1. Continue Advair 230 - 2 inhalations twice a day  2. Continue Flovent 220 - 2 inhalations twice a day added to Advair during "FLARE UP"  3. Continue Spiriva 2.5 respimat - two inhalations one time per day.  4. Continue pantoprazole 40mg  in the AM and Ranitidine 300mg  in the PM  5. Continue Xolair and Epi-Pen  6. Continue Montelukast 10mg  - one tablet one time per day.  7. If needed:   A. Nasal saline several times per day  B. Mucinex DM two times per day  C. nasal azelastine two sprays each nostril 1-2 times per day  D. Proventil HFA or albuterol nebulization  8. Use prednisone 10mg  one time per day for 5 days only. Check blood sugars.  9. Further treatment?  10. Return to clinic in 12 weeks or earlier if there is a problem.  [name] appears to have a viral respiratory tract infection that is giving rise to ETD and giving rise to significant inflammation of her upper airway.  Fortunately, this does not appear to have precipitated a large amount of problem with her lower airway yet.  I will give her a very low-dose of a systemic steroid to help with inflammation and hopefully to eliminate the swelling of her eustachian tube and get this middle ear drained.  We will hold off on any antibiotics at this point in time.  She will keep in contact with me noting her response to this approach.  Alicia Katz, MD Allergy / Immunology [facility]

## 2017-12-03 NOTE — Patient Instructions (Signed)

## 2017-12-03 NOTE — Progress Notes (Signed)
Thank you [name] 

## 2017-12-03 NOTE — Progress Notes (Signed)
SLEEP MEDICINE CLINIC    Provider:  Larey Seat, [place] D  Primary Care Physician:  [name], MD   Referring Provider: [name], MD    Chief Complaint  Patient presents with  . New Patient (Initial Visit)    pt alone, rm 10. pt has had a HST in past, They never explained the results of the study.     HPI:  [name] is a 72 y.o. female patient of African and Native American decent , originally from [place] , seen here  in a referral from Dr. Diona Browner for a sleep evaluation.  Mrs. Kolasinski had undergone a HST with a [place] and Clinical research associate  and was never given the results, her neurologist in [place] wanted to treat her "hip pain with shots in my back ". She had been referred to Neurology by a [facility] podiarist. She did not have much trust in her doctor anymore and saw Dr Gladstone Lighter. He  wanted to follow up, he did X rays and felt there was no need for injection- and when he got the sleep test results he forwarded them to Dr. Diona Browner and from there to me.  Dr. Larwance Rote ordered to study for 13 March 2017 as a home sleep test.  The study supposedly revealed mild obstructive sleep apnea with an AHI of 15, oxygen nadir was 81%, regular EKG was quoted. No prolonged desaturation.   There was no report of prolonged oxygen desaturation,.  I reviewed the patient's data sheet from her home sleep test which was performed on night 1 machine.   Her AHI overall actually was 15, she did not have central apneas, in supine position she had more apnea and been on her side, supine AHI was 32.4.  She had lost weight down to 240 pounds form 268, now 225- which may have further lowered the apnea.   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   Sleep habits are as follows:  Before the patient falls asleep she usually watches TV in her bedroom and in her bed.  Sleep onset varies greatly, and she reports that the  average sleep onset time is around 1 AM.  Yesterday for example she actually fell asleep already at 9, woke up at midnight, had one bathroom break which is usual for her but usually the bathroom break is between 3 and 4 in the morning but not midnight. She sleeps on only one pillow, but reports GERD.  She shares her bed with her dog, the dog snores. The bedroom is cool, but she sleeps with the TV on.  She sleeps only 2-6 hours nightly. She has reported an average of 4-5 hours. She is unaware of basic sleep hygiene rules. She dreams some night.   Sleep medical history and family sleep history: not sure of any family members with OSA, sleep walking in a  nephew, had 5 sisters and 2 brothers, 2 sister have passed. She was the third child.    Social history: widowed 27 years , one daughter, one grandson. Used to work night shifts, retired in 2007 . Non smoker, non drinker, caffeine - home made iced tea, 2-3 glasses. Decaffeinated.    Review of Systems: Out of a complete 14 system review, the patient complains of only the following symptoms, and all other reviewed systems are negative.  snoring, waking herself, insomnia, reduced sleep time. No naps in  daytime.  Twitching, sudden movements. No RLS symptoms reported ( I asked ) .  Epworth score 8 , Fatigue severity score 17  , depression score n/a    Social History   Socioeconomic History  . Marital status: Widowed    Spouse name: Not on file  . Number of children: Y  . Years of education: Not on file  . Highest education level: Not on file  Occupational History  . Occupation: retired Geologist, engineering.   Social Needs  . Financial resource strain: Not on file  . Food insecurity:    Worry: Not on file    Inability: Not on file  . Transportation needs:    Medical: Not on file    Non-medical: Not on file  Tobacco Use  . Smoking status: Never Smoker  . Smokeless tobacco: Never Used  Substance and Sexual Activity  . Alcohol use: Yes     Alcohol/week: 0.0 oz    Comment: occ glass on wine  . Drug use: No  . Sexual activity: Never  Lifestyle  . Physical activity:    Days per week: Not on file    Minutes per session: Not on file  . Stress: Not on file  Relationships  . Social connections:    Talks on phone: Not on file    Gets together: Not on file    Attends religious service: Not on file    Active member of club or organization: Not on file    Attends meetings of clubs or organizations: Not on file    Relationship status: Not on file  . Intimate partner violence:    Fear of current or ex partner: Not on file    Emotionally abused: Not on file    Physically abused: Not on file    Forced sexual activity: Not on file  Other Topics Concern  . Not on file  Social History Narrative   Widow    limited exercise.    Family History  Problem Relation Age of Onset  . Hypertension Mother   . Clotting disorder Mother   . Breast cancer Mother   . Arthritis Mother   . Stroke Mother   . Diabetes Mother   . Cancer Brother   . Alcohol abuse Father   . Arthritis Sister   . Diabetes Sister   . Multiple sclerosis Sister   . Allergies Other        grandson    Past Medical History:  Diagnosis Date  . Allergic rhinitis   . Allergy   . Arthritis   . Asthma   . Chronic headache   . Diabetes mellitus   . DVT (deep venous thrombosis) ([place])   . Dyspnea   . Heart murmur   . Hypertension   . Pulmonary embolism ([place])   . Pulmonary hypertension ([place])   . Seizures ([place])     Past Surgical History:  Procedure Laterality Date  . ABDOMINAL HYSTERECTOMY     partial, has ovaries  . CARDIAC CATHETERIZATION  12/28/2010   Mod. pulmonary hypertension, normal coronary arteries  . DOPPLER ECHOCARDIOGRAPHY  10/08/2011   EF=>55%,mild asymmetric LVH, mod. TR, mod. PH, mild to mod LA dilatation  . KNEE ARTHROSCOPY Left   . KNEE SURGERY    . Nuclear Stress Test  05/20/2006   No ischemia  . PARTIAL HYSTERECTOMY    . PLANTAR FASCIA  SURGERY    . TONSILLECTOMY      Current Outpatient Medications  Medication Sig Dispense Refill  .  ADVAIR HFA 230-21 MCG/ACT inhaler INHALE 2 PUFFS BY MOUTH TWICE DAILY 12 g 3  . albuterol (PROVENTIL HFA) 108 (90 BASE) MCG/ACT inhaler Inhale 2 puffs into the lungs every 6 (six) hours as needed for wheezing or shortness of breath. 1 Inhaler 0  . azelastine (ASTELIN) 0.1 % nasal spray USE 1-2 SPRAYS IN EACH NOSTRIL TWICE DAILY AS NEEDED 30 mL 5  . cyclobenzaprine (FLEXERIL) 10 MG tablet Take 1 tablet (10 mg total) by mouth 2 (two) times daily as needed for muscle spasms. 15 tablet 0  . diltiazem (CARTIA XT) 120 MG 24 hr capsule Take 1 capsule (120 mg total) by mouth daily. KEEP OV. 30 capsule 2  . DULoxetine (CYMBALTA) 30 MG capsule Take 30 mg by mouth daily.    . Empagliflozin-Linagliptin (GLYXAMBI) 10-5 MG TABS Take 1 tablet by mouth daily.    Marland Kitchen EPINEPHrine 0.3 mg/0.3 mL IJ SOAJ injection USE AS DIRECTED FOR LIFE THREATENING ALLERGIC REACTIONS 2 Device 2  . fluconazole (DIFLUCAN) 150 MG tablet Take 1 tablet (150 mg total) by mouth once a week. 2 tablet 0  . fluticasone (FLOVENT HFA) 220 MCG/ACT inhaler Inhale 2 puffs into the lungs 2 (two) times daily as needed. 1 Inhaler 5  . furosemide (LASIX) 40 MG tablet TAKE 1 TABLET(40 MG) BY MOUTH DAILY 30 tablet 3  . gabapentin (NEURONTIN) 300 MG capsule Take 600 mg by mouth 3 (three) times daily.  6  . irbesartan (AVAPRO) 150 MG tablet TAKE 1 TABLET(150 MG) BY MOUTH DAILY 30 tablet 9  . Lactase (DAIRY-RELIEF PO) Take 1 capsule by mouth daily as needed (stomach upset).     Marland Kitchen levalbuterol (XOPENEX) 1.25 MG/3ML nebulizer solution Take 1.25 mg by nebulization every 4 (four) hours as needed for wheezing. 150 mL 0  . Magnesium 250 MG TABS Take 1 tablet by mouth daily.    . methimazole (TAPAZOLE) 10 MG tablet Take 10 mg by mouth 2 (two) times daily.    . mirabegron ER (MYRBETRIQ) 50 MG TB24 tablet Take 50 mg by mouth daily.    . montelukast (SINGULAIR) 10 MG  tablet TAKE 1 TABLET BY MOUTH EVERY DAY 30 tablet 1  . nystatin cream (MYCOSTATIN) Apply 1 application topically 2 (two) times daily. Continue 48 hours after rash resolved. 30 g 1  . ONETOUCH DELICA LANCETS 68T MISC as needed (blood check).     [name] VERIO test strip 1 each by Other route as needed (blood monitoring).     . pantoprazole (PROTONIX) 40 MG tablet TAKE 1 TABLET BY MOUTH EVERY DAY 90 tablet 1  . PATADAY 0.2 % SOLN INT 1 GTT IN OU QD PRN  4  . polyethylene glycol (MIRALAX / GLYCOLAX) packet Take 17 g by mouth daily.    . predniSONE (DELTASONE) 20 MG tablet Take 2 tablets daily with breakfast. 10 tablet 0  . Probiotic Product (ALIGN) 4 MG CAPS Take 1 capsule by mouth daily.    . ranitidine (ZANTAC) 300 MG tablet TAKE 1 TABLET BY MOUTH EVERY NIGHT AT BEDTIME 90 tablet 0  . SPIRIVA RESPIMAT 1.25 MCG/ACT AERS INHALE 2 PUFFS BY MOUTH EVERY DAY 4 g 3  . SUMAtriptan (IMITREX) 100 MG tablet Take 100 mg x 1 may, repeat in 2 hour for migraine. 10 tablet 0  . SUPER B COMPLEX/C PO Take 1 tablet by mouth daily.    . traMADol (ULTRAM) 50 MG tablet Take 1 tablet (50 mg total) by mouth every 8 (eight) hours as needed.  30 tablet 0  . XARELTO 20 MG TABS tablet TAKE 1 TABLET(20 MG) BY MOUTH DAILY WITH SUPPER 90 tablet 1  . hydrOXYzine (ATARAX/VISTARIL) 10 MG tablet TK 1 TO 2 TS PO HS  1   Current Facility-Administered Medications  Medication Dose Route Frequency Provider Last Rate Last Dose  . omalizumab Arvid Right) injection 300 mg  300 mg Subcutaneous Q28 days [name], MD   300 mg at 11/19/17 1140    Allergies as of 12/03/2017 - Review Complete 12/03/2017  Allergen Reaction Noted  . Latex Hives 12/03/2017  . Lyrica [pregabalin] Other (See Comments) 05/19/2014  . Penicillins Swelling 12/27/2010  . Phenytoin sodium extended Swelling 01/10/2011  . Vicodin [hydrocodone-acetaminophen] Nausea And Vomiting 04/23/2014    Vitals: BP 131/74   Pulse (!) 50   Ht 5\' 5"  (1.651 m)   Wt 228 lb  (103.4 kg)   BMI 37.94 kg/m  Last Weight:  Wt Readings from Last 1 Encounters:  12/03/17 228 lb (103.4 kg)   ENI:DPOE mass index is 37.94 kg/m.     Last Height:   Ht Readings from Last 1 Encounters:  12/03/17 5\' 5"  (1.651 m)    Physical exam:  General: The patient is awake, alert and appears not in acute distress. The patient is well groomed. Head: Normocephalic, atraumatic. Neck is supple. Mallampati 2,  neck circumference: 15.5 . Nasal airflow patent , Retrognathia is not seen !. She bites her gum sometimes at night.  Cardiovascular:  Regular rate and rhythm, without  murmurs or carotid bruit, and without distended neck veins. Respiratory: Lungs are clear to auscultation. Skin:  Without evidence of edema, or rash Trunk: BMI is elevated . The patient's posture is stooped.    Neurologic exam : The patient is awake and alert, oriented to place and time.   MOCA:No flowsheet data found. MMSE: MMSE - Mini Mental State Exam 03/25/2017 03/21/2016  Orientation to time 5 5  Orientation to Place 5 5  Registration 3 3  Attention/ Calculation 0 0  Recall 3 3  Language- name 2 objects 0 0  Language- repeat 1 1  Language- follow 3 step command 3 3  Language- read & follow direction 0 0  Write a sentence 0 0  Copy design 0 0  Total score 20 20       Attention span & concentration ability appears normal.  Speech is fluent,  without  dysarthria, dysphonia or aphasia.  Mood and affect are appropriate.  Cranial nerves: Pupils are equal and briskly reactive to light. Funduscopic exam without evidence of pallor or edema. Extraocular movements  in vertical and horizontal planes intact and without nystagmus. Visual fields by finger perimetry are intact. Hearing to finger rub intact- but she reports tinnitus on the left - Facial sensation intact to fine touch.  Facial motor strength is symmetric and tongue and uvula move midline. Shoulder shrug was symmetrical.  Motor exam:   Normal tone,  muscle bulk and symmetric strength in all extremities. Atrophy of the thenar eminence .  Sensory:  Fine touch, pinprick and vibration were tested in all extremities. Numbness in thumb joints and index finger.  Coordination: Rapid alternating movements / Finger-to-nose maneuver were normal without evidence of ataxia, dysmetria or tremor. Gait and station: Patient walks without assistive device and is able unassisted to climb up to the exam table. Strength within normal limits. Stance is stable and normal. Turns with  4 Steps. Romberg deferred  Deep tendon reflexes: in the  upper  and lower extremities are symmetric and intact. Babinski maneuver response is downgoing.   Assessment:  After physical and neurologic examination, review of laboratory studies,  Personal review of imaging studies, reports of other /same  Imaging studies, results of polysomnography and / or neurophysiology testing and pre-existing records as far as provided in visit., my assessment is   1) OSA ? She is snoring .  The patient underwent a home sleep test which is notoriously unable to give good position or sleep architecture information.   It seems that her apnea was very mild with an AHI of 15/h, the total time of desaturation was 7.3 minutes, hardly clinically significant.   There were no abnormal heart rates according to the interpretation-  the highest heart rate during sleep was 113 bpm.   Snoring was not evaluated with this device.    I would very much like for the patient to have a repeat sleep study to see in an attended sleep study if apnea is present if it is positional, and if there is any desaturation associated.  If not we do not have to bother her again.     2) insomnia . I think her main problem with sleep is that she does not have established sleep routines and routine bedtime.  The main conversation for this consultation will be around sleep hygiene and limitations and I will give her the handout for a 14-day  camp of sleep. Her third shift history may have contributes to the late bedtime.    3) weight and pain . She has been losing and gaining, attributed weight gain to steroids for knee pain, shoulder pain, back pain, all related to arthritis, 2 arthroscopies in the right knee.    The patient was advised of the nature of the diagnosed disorder , the treatment options and the  risks for general health and wellness arising from not treating the condition.   I spent more than 45 minutes of face to face time with the patient.  Greater than 50% of time was spent in counseling and coordination of care. We have discussed the diagnosis and differential and I answered the patient's questions.    Plan:  Treatment plan and additional workup :   SPLIT night, at AHI 20 , and sleep hygiene plan.  All electronics out the bedroom, dog not to sleep in her bedroom, use a neutral noise machine, set a bedtime. Set a rise time and stick to it for 14 days, no naps.   Larey Seat, MD 0/98/1191, 47:82 PM  Certified in Neurology by ABPN Certified in [place] by [facility] [address] [place], [place] [address]

## 2017-12-04 ENCOUNTER — Ambulatory Visit
Admission: RE | Admit: 2017-12-04 | Discharge: 2017-12-04 | Disposition: A | Discharge status: Home / Self Care | Payer: Medicare Other | Source: Ambulatory Visit | Attending: Internal Medicine | Admitting: Internal Medicine

## 2017-12-04 ENCOUNTER — Encounter: Payer: Self-pay | Admitting: Allergy and Immunology

## 2017-12-04 ENCOUNTER — Other Ambulatory Visit (HOSPITAL_COMMUNITY)
Admission: RE | Admit: 2017-12-04 | Discharge: 2017-12-04 | Disposition: A | Discharge status: Home / Self Care | Payer: Medicare Other | Source: Ambulatory Visit | Attending: Radiology | Admitting: Radiology

## 2017-12-04 DIAGNOSIS — E041 Nontoxic single thyroid nodule: Secondary | ICD-10-CM

## 2017-12-05 ENCOUNTER — Telehealth: Payer: Self-pay

## 2017-12-05 NOTE — Telephone Encounter (Signed)
Copied from [place] [id number]. Topic: General - Other >> Dec 05, 2017 10:02 AM Lennox Solders wrote: Reason for CRM: pt had a personal procedure done yesterday and she does not want to elaborate on it. Dr Garnet Koyanagi requested the procedure. Pt would like the nurse to call her back

## 2017-12-05 NOTE — Telephone Encounter (Signed)
I spoke with pt; pt only wants to speak with [name] CMA; I advised I was a nurse here at [facility] and would be glad to help pt but pt said she only wants to speak with [name].

## 2017-12-05 NOTE — Telephone Encounter (Signed)
Spoke with Ms. Everman.  She states she went to [place] for her daughters graduation and did a lot of walking.  Now her knee is bothering her.  She is asking if Dr. Diona Browner would send in a prescription for Tylenol #3.  She states she use to get this from her foot doctor and Dr. Wilson Singer which both have retired.  Tramadol is on her current medication list but she states she has not had any Tramadol since it was last prescribe in 12/2016.  She also wanted to let Dr. Diona Browner know that she had her thyroid biopsy done yesterday.  She is also scheduled to see Dr. Cruzita Lederer in July.  Please advise about the Tylenol #3 Rx.

## 2017-12-06 MED ORDER — TRAMADOL HCL 50 MG PO TABS: 50.0000 mg | ORAL_TABLET | Freq: Three times a day (TID) | ORAL | 0 refills | Status: DC | PRN

## 2017-12-06 NOTE — Telephone Encounter (Signed)
It would be safer for her to use tramadol for pain. I will send some of this in. If pain not improving call next week for appt.

## 2017-12-06 NOTE — Telephone Encounter (Signed)
Ms. [name] notified as instructed by telephone.

## 2017-12-07 ENCOUNTER — Telehealth: Payer: Self-pay | Admitting: Allergy & Immunology

## 2017-12-07 MED ORDER — DOXYCYCLINE HYCLATE 100 MG PO CAPS: 100.0000 mg | ORAL_CAPSULE | Freq: Two times a day (BID) | ORAL | 0 refills | Status: AC

## 2017-12-07 NOTE — Telephone Encounter (Signed)
Received a call from Ms. Melby letting me know that the prednisone that she was taking for her sore throat and eat pain had finished, but she felt no better. She has been afebrile. She is allergic to PCN, therefore I sent in a ten day course of doxycycline.   Salvatore Marvel, MD [facility] [place]

## 2017-12-10 ENCOUNTER — Ambulatory Visit (INDEPENDENT_AMBULATORY_CARE_PROVIDER_SITE_OTHER): Payer: Medicare Other | Admitting: Allergy and Immunology

## 2017-12-10 ENCOUNTER — Encounter: Payer: Self-pay | Admitting: Allergy and Immunology

## 2017-12-10 ENCOUNTER — Telehealth: Payer: Self-pay | Admitting: Allergy and Immunology

## 2017-12-10 VITALS — BP 130/70 | HR 68 | Resp 20

## 2017-12-10 DIAGNOSIS — B370 Candidal stomatitis: Secondary | ICD-10-CM

## 2017-12-10 DIAGNOSIS — H6982 Other specified disorders of Eustachian tube, left ear: Secondary | ICD-10-CM

## 2017-12-10 DIAGNOSIS — J3089 Other allergic rhinitis: Secondary | ICD-10-CM

## 2017-12-10 DIAGNOSIS — J4550 Severe persistent asthma, uncomplicated: Secondary | ICD-10-CM

## 2017-12-10 DIAGNOSIS — K219 Gastro-esophageal reflux disease without esophagitis: Secondary | ICD-10-CM

## 2017-12-10 MED ORDER — NYSTATIN 100000 UNIT/ML MT SUSP: 5.0000 mL | Freq: Three times a day (TID) | OROMUCOSAL | 0 refills | Status: AC

## 2017-12-10 MED ORDER — FLUCONAZOLE 150 MG PO TABS: 150.0000 mg | ORAL_TABLET | Freq: Once | ORAL | 0 refills | Status: AC

## 2017-12-10 NOTE — Progress Notes (Signed)
Follow-up Note  Referring Provider: [name], MD Primary Provider: [name], MD Date of Office Visit: 12/10/2017  Subjective:   [name] (DOB: Nov 28, 1945) is a 72 y.o. female who returns to the [facility] [place] on 12/10/2017 in re-evaluation of the following:  HPI: [name] returns to this clinic in reevaluation of her asthma and allergic rhinitis and LPR and history of pulmonary hypertension and  diastolic dysfunction and a recent viral respiratory tract infection for which she was seen in this clinic on 03 Dec 2017.  During her last visit she had sore throat and ear pain and runny nose.  This apparently did not improve and she contacted a physician this weekend who treated her with doxycycline which she has utilized this medication on Saturday and Sunday and Monday and today.  She is really not that much better and she still has some coughing and she is making some brown sputum production and both her ears hurt and her head is full.  She has not been having any high fever or ugly nasal discharge or chest pain and she does not need to add in her short acting bronchodilator. She has had some nausea the past few days. She did activate her action plan which includes adding Flovent to her combination inhaler.  Allergies as of 12/10/2017      Reactions   Latex Hives   Lyrica [pregabalin] Other (See Comments)   Blurred vision.   Penicillins Swelling   Has patient had a PCN reaction causing immediate rash, facial/tongue/throat swelling, SOB or lightheadedness with hypotension patient had a PCN reaction causing severe rash involving mucus membranes or skin necrosis: [id number]} Has patient had a PCN reaction that required hospitalization/No Has patient had a PCN reaction occurring within the last 10 years: No If all of the above answers are "NO", then may proceed with Cephalosporin use.   Phenytoin Sodium Extended Swelling   Swelling    Vicodin  [hydrocodone-acetaminophen] Nausea And Vomiting      Medication List      ADVAIR HFA 357-01 MCG/ACT inhaler Generic drug:  fluticasone-salmeterol INHALE 2 PUFFS BY MOUTH TWICE DAILY   albuterol 108 (90 Base) MCG/ACT inhaler Commonly known as:  PROVENTIL HFA Inhale 2 puffs into the lungs every 6 (six) hours as needed for wheezing or shortness of breath.   ALIGN 4 MG Caps Take 1 capsule by mouth daily.   azelastine 0.1 % nasal spray Commonly known as:  ASTELIN USE 1-2 SPRAYS IN EACH NOSTRIL TWICE DAILY AS NEEDED   cyclobenzaprine 10 MG tablet Commonly known as:  FLEXERIL Take 1 tablet (10 mg total) by mouth 2 (two) times daily as needed for muscle spasms.   DAIRY-RELIEF PO Take 1 capsule by mouth daily as needed (stomach upset).   diltiazem 120 MG 24 hr capsule Commonly known as:  CARTIA XT Take 1 capsule (120 mg total) by mouth daily. KEEP OV.   doxycycline 100 MG capsule Commonly known as:  VIBRAMYCIN Take 1 capsule (100 mg total) by mouth 2 (two) times daily for 10 days.   DULoxetine 30 MG capsule Commonly known as:  CYMBALTA Take 30 mg by mouth daily.   EPINEPHrine 0.3 mg/0.3 mL Soaj injection Commonly known as:  EPI-PEN USE AS DIRECTED FOR LIFE THREATENING ALLERGIC REACTIONS   fluticasone 220 MCG/ACT inhaler Commonly known as:  FLOVENT HFA Inhale 2 puffs into the lungs 2 (two) times daily as needed.   furosemide 40 MG tablet Commonly  known as:  LASIX TAKE 1 TABLET(40 MG) BY MOUTH DAILY   gabapentin 300 MG capsule Commonly known as:  NEURONTIN Take 600 mg by mouth 3 (three) times daily.   GLYXAMBI 10-5 MG Tabs Generic drug:  Empagliflozin-linaGLIPtin Take 1 tablet by mouth daily.   hydrOXYzine 10 MG tablet Commonly known as:  ATARAX/VISTARIL TK 1 TO 2 TS PO HS   irbesartan 150 MG tablet Commonly known as:  AVAPRO TAKE 1 TABLET(150 MG) BY MOUTH DAILY   levalbuterol 1.25 MG/3ML nebulizer solution Commonly known as:  XOPENEX Take 1.25 mg by  nebulization every 4 (four) hours as needed for wheezing.   Magnesium 250 MG Tabs Take 1 tablet by mouth daily.   methimazole 10 MG tablet Commonly known as:  TAPAZOLE Take 10 mg by mouth 2 (two) times daily.   montelukast 10 MG tablet Commonly known as:  SINGULAIR TAKE 1 TABLET BY MOUTH EVERY DAY   MYRBETRIQ 50 MG Tb24 tablet Generic drug:  mirabegron ER Take 50 mg by mouth daily.   ONETOUCH DELICA LANCETS 67E Misc as needed (blood check).   ONETOUCH VERIO test strip Generic drug:  glucose blood 1 each by Other route as needed (blood monitoring).   pantoprazole 40 MG tablet Commonly known as:  PROTONIX TAKE 1 TABLET BY MOUTH EVERY DAY   PATADAY 0.2 % Soln Generic drug:  Olopatadine HCl INT 1 GTT IN OU QD PRN   polyethylene glycol packet Commonly known as:  MIRALAX / GLYCOLAX Take 17 g by mouth daily.   predniSONE 10 MG tablet Commonly known as:  DELTASONE Take 10 mg by mouth daily with breakfast.   ranitidine 300 MG tablet Commonly known as:  ZANTAC TAKE 1 TABLET BY MOUTH EVERY NIGHT AT BEDTIME   SPIRIVA RESPIMAT 1.25 MCG/ACT Aers Generic drug:  Tiotropium Bromide Monohydrate INHALE 2 PUFFS BY MOUTH EVERY DAY   SUMAtriptan 100 MG tablet Commonly known as:  IMITREX Take 100 mg x 1 may, repeat in 2 hour for migraine.   SUPER B COMPLEX/C PO Take 1 tablet by mouth daily.   traMADol 50 MG tablet Commonly known as:  ULTRAM Take 1 tablet (50 mg total) by mouth every 8 (eight) hours as needed.   XARELTO 20 MG Tabs tablet Generic drug:  rivaroxaban TAKE 1 TABLET(20 MG) BY MOUTH DAILY WITH SUPPER       Past Medical History:  Diagnosis Date  . Allergic rhinitis   . Allergy   . Arthritis   . Asthma   . Chronic headache   . Diabetes mellitus   . DVT (deep venous thrombosis) ([place])   . Dyspnea   . Heart murmur   . Hypertension   . Pulmonary embolism ([place])   . Pulmonary hypertension ([place])   . Seizures ([place])     Past Surgical History:  Procedure  Laterality Date  . ABDOMINAL HYSTERECTOMY     partial, has ovaries  . CARDIAC CATHETERIZATION  12/28/2010   Mod. pulmonary hypertension, normal coronary arteries  . DOPPLER ECHOCARDIOGRAPHY  10/08/2011   EF=>55%,mild asymmetric LVH, mod. TR, mod. PH, mild to mod LA dilatation  . KNEE ARTHROSCOPY Left   . KNEE SURGERY    . Nuclear Stress Test  05/20/2006   No ischemia  . PARTIAL HYSTERECTOMY    . PLANTAR FASCIA SURGERY    . TONSILLECTOMY      Review of systems negative except as noted in HPI / PMHx or noted below:  Review of Systems  Constitutional: Negative.  HENT: Negative.   Eyes: Negative.   Respiratory: Negative.   Cardiovascular: Negative.   Gastrointestinal: Negative.   Genitourinary: Negative.   Musculoskeletal: Negative.   Skin: Negative.   Neurological: Negative.   Endo/Heme/Allergies: Negative.   Psychiatric/Behavioral: Negative.      Objective:   Vitals:   12/10/17 1122  BP: 130/70  Pulse: 68  Resp: 20          Physical Exam  HENT:  Head: Normocephalic.  Right Ear: External ear and ear canal normal. A middle ear effusion is present.  Left Ear: External ear and ear canal normal. A middle ear effusion is present.  Nose: Nose normal. No mucosal edema or rhinorrhea.  Mouth/Throat: Uvula is midline and mucous membranes are normal. Oropharyngeal exudate (Thrush) present. No posterior oropharyngeal erythema.  Eyes: Conjunctivae are normal.  Neck: Trachea normal. No tracheal tenderness present. No tracheal deviation present. No thyromegaly present.  Cardiovascular: Normal rate, regular rhythm, S1 normal, S2 normal and normal heart sounds.  No murmur heard. Pulmonary/Chest: Breath sounds normal. No stridor. No respiratory distress. She has no wheezes. She has no rales.  Musculoskeletal: She exhibits no edema.  Lymphadenopathy:       Head (right side): No tonsillar adenopathy present.       Head (left side): No tonsillar adenopathy present.    She has no  cervical adenopathy.  Neurological: She is alert.  Skin: No rash noted. She is not diaphoretic. No erythema. Nails show no clubbing.    Diagnostics:    Spirometry was performed and demonstrated an FEV1 of 1.27 at 66 % of predicted.  The patient had an Asthma Control Test with the following results: ACT Total Score: 11.    Assessment and Plan:   1. Asthma, severe persistent, well-controlled   2. Other allergic rhinitis   3. LPRD (laryngopharyngeal reflux disease)   4. ETD (Eustachian tube dysfunction), left   5. Thrush     1. Continue Advair 230 - 2 inhalations twice a day  2. Continue Flovent 220 - 2 inhalations twice a day added to Advair during "FLARE UP"  3. Continue Spiriva 2.5 respimat - two inhalations one time per day.  4. Continue pantoprazole 40mg  in the AM and Ranitidine 300mg  in the PM  5. Continue Xolair and Epi-Pen  6. Continue Montelukast 10mg  - one tablet one time per day.  7. If needed:   A. Nasal saline several times per day  B. Mucinex DM two times per day  C. nasal azelastine two sprays each nostril 1-2 times per day  D. Proventil HFA or albuterol nebulization  8. For this recent episode:   A. Stop doxycycline  B. Diflucan 150 single tablet today  C. Nystatin oral solution 80mls three times a day for 7 days  9. Hopefully by this weekend improvement will occur. Further treatment?  10. Return to clinic in 12 weeks or earlier if there is a problem.  It still appears that Eowyn has a viral respiratory tract infection with ETD and this has been complicated by the development of thrush and nausea most likely because of her doxycycline use.  We will treat her with the therapy noted above and assume that as this week completes she will do much better.  It may take 10 to 14 days to completely resolve this issue.  Of course she can contact me should she develop significant problems in the face of this plan but otherwise I will see her back in this clinic in  12 weeks while she continues to use a large collection of therapy directed against respiratory tract inflammation and reflux.  Allena Katz, MD Allergy / Immunology [place]

## 2017-12-10 NOTE — Patient Instructions (Addendum)
  1. Continue Advair 230 - 2 inhalations twice a day  2. Continue Flovent 220 - 2 inhalations twice a day added to Advair during "FLARE UP"  3. Continue Spiriva 2.5 respimat - two inhalations one time per day.  4. Continue pantoprazole 40mg  in the AM and Ranitidine 300mg  in the PM  5. Continue Xolair and Epi-Pen  6. Continue Montelukast 10mg  - one tablet one time per day.  7. If needed:   A. Nasal saline several times per day  B. Mucinex DM two times per day  C. nasal azelastine two sprays each nostril 1-2 times per day  D. Proventil HFA or albuterol nebulization  8. For this recent episode:   A. Stop doxycycline  B. Diflucan 150 single tablet today  C. Nystatin oral solution 72mls three times a day for 7 days  9. Hopefully by this weekend improvement will occur. Further treatment?  10. Return to clinic in 12 weeks or earlier if there is a problem.

## 2017-12-10 NOTE — Telephone Encounter (Signed)
Stop the doxycycline and nausa should resolve.

## 2017-12-10 NOTE — Telephone Encounter (Signed)
Pt called back and wanted to know what she can take because she feels like she is going to throw up. [phone].

## 2017-12-10 NOTE — Telephone Encounter (Signed)
Left message advising to return call. Need to advise to stop doxycycline to get the nausea to resolve

## 2017-12-10 NOTE — Telephone Encounter (Signed)
Dr Kozlow please advise 

## 2017-12-10 NOTE — Telephone Encounter (Signed)
Spoke to patient advised per Dr Neldon Mc to stop doxycycline and this should resolve. Patient verbalized understanding

## 2017-12-11 ENCOUNTER — Encounter: Payer: Self-pay | Admitting: Allergy and Immunology

## 2017-12-16 ENCOUNTER — Telehealth: Payer: Self-pay | Admitting: Allergy and Immunology

## 2017-12-16 ENCOUNTER — Telehealth: Payer: Self-pay

## 2017-12-16 ENCOUNTER — Other Ambulatory Visit: Payer: Self-pay | Admitting: Allergy and Immunology

## 2017-12-16 ENCOUNTER — Telehealth: Payer: Self-pay | Admitting: Neurology

## 2017-12-16 DIAGNOSIS — J4540 Moderate persistent asthma, uncomplicated: Secondary | ICD-10-CM

## 2017-12-16 NOTE — Telephone Encounter (Signed)
Copied from [place] [id number]. Topic: General - Other >> Dec 16, 2017  3:41 PM Valla Leaver wrote: Reason for CRM: Patient's neurologist told her to have Dr. Diona Browner or her CMA call her back to discuss medications from her old neurologist? Please call back to discuss.

## 2017-12-16 NOTE — Telephone Encounter (Signed)
Called the pt and informed her that this script was not ordered by [place]. Pt states that it came from another MD she was seeing that Dr Brett Fairy was aware of. I informed her that Dr Brett Fairy would be ok with her continuing the medication but that she would want to reach out to her PCP and make them aware and see if they would take the medication on. If there is any conflict instructed the patient to call back. Since this was a antidepressant medication would be better coming from her PCP. Pt verbalized understanding and states she will contact them.

## 2017-12-16 NOTE — Telephone Encounter (Signed)
Pt called and needs to have Montelukast called in for 90 day supply to [facility]. [phone].

## 2017-12-16 NOTE — Telephone Encounter (Signed)
Prescriptions were sent in already.

## 2017-12-16 NOTE — Telephone Encounter (Signed)
Pt request refill for DULoxetine (CYMBALTA) 30 MG capsule sent to [facility]. Pt is unsure if she is to continue taking this medication. Please call to advise

## 2017-12-16 NOTE — Telephone Encounter (Signed)
Please see phone note for 12/16/17 with neurology.

## 2017-12-17 ENCOUNTER — Ambulatory Visit (INDEPENDENT_AMBULATORY_CARE_PROVIDER_SITE_OTHER): Payer: Medicare Other | Admitting: *Deleted

## 2017-12-17 DIAGNOSIS — M542 Cervicalgia: Secondary | ICD-10-CM | POA: Diagnosis not present

## 2017-12-17 DIAGNOSIS — J4540 Moderate persistent asthma, uncomplicated: Secondary | ICD-10-CM

## 2017-12-17 DIAGNOSIS — M25512 Pain in left shoulder: Secondary | ICD-10-CM | POA: Diagnosis not present

## 2017-12-17 MED ORDER — DULOXETINE HCL 30 MG PO CPEP: 30.0000 mg | ORAL_CAPSULE | Freq: Every day | ORAL | 5 refills | Status: DC

## 2017-12-17 NOTE — Telephone Encounter (Signed)
Left message for [name] that Dr. Diona Browner refilled her Cymbalta at [facility]. [place] in [place].

## 2017-12-17 NOTE — Telephone Encounter (Signed)
Okay to refill cymbalta  As requested #30, 5 RF

## 2017-12-24 ENCOUNTER — Encounter: Payer: Self-pay | Admitting: Family Medicine

## 2017-12-24 ENCOUNTER — Other Ambulatory Visit: Payer: Self-pay | Admitting: Family Medicine

## 2017-12-24 ENCOUNTER — Telehealth: Payer: Self-pay | Admitting: Cardiovascular Disease

## 2017-12-24 ENCOUNTER — Ambulatory Visit (INDEPENDENT_AMBULATORY_CARE_PROVIDER_SITE_OTHER): Payer: Medicare Other | Admitting: Family Medicine

## 2017-12-24 DIAGNOSIS — M5442 Lumbago with sciatica, left side: Secondary | ICD-10-CM | POA: Diagnosis not present

## 2017-12-24 MED ORDER — DICLOFENAC SODIUM 75 MG PO TBEC: 75.0000 mg | DELAYED_RELEASE_TABLET | Freq: Two times a day (BID) | ORAL | 0 refills | Status: DC

## 2017-12-24 MED ORDER — ACETAMINOPHEN-CODEINE #3 300-30 MG PO TABS: 1.0000 | ORAL_TABLET | ORAL | 0 refills | Status: DC | PRN

## 2017-12-24 NOTE — Addendum Note (Signed)
Addended by: Carter Kitten on: 12/24/2017 05:27 PM   Modules accepted: Orders

## 2017-12-24 NOTE — Assessment & Plan Note (Signed)
Recent course prednisone x 2 for other reasons.. Will instead use short course of diclofenac ( not long term given on xarelto blood thinner) for pain and inflammation. Start heat, massage and home PT.  If not improving may need further imaging.. Per pt report recent [place] X-ray lumbar spine nml.

## 2017-12-24 NOTE — Telephone Encounter (Signed)
Spoke with pt and she stated that she will call her cardiologist to see of if he is ok to use diclofenac while on Xarelto.  Pt stated that she is allergic to Vicodin (causes nausea and vomiting) and is asking if she could be prescribed acetaminophen with codeine instead. Pt would appreciate a phone call regarding this.

## 2017-12-24 NOTE — Progress Notes (Signed)
Subjective:    Patient ID: [name], female    DOB: 1945/08/23, 72 y.o.   MRN: [id number]  HPI    72 year old female pt with history of neuropathy due to DM, obesity, groin pain OA in knees presetns with new onset back pain.   She reports 1-2 weeks ago started with pain in low back. Started with neck pain but now mainly in lumbar spine.  Pain in low back, bilateral on side, not over vertebrae. No pain radiated to legs. No new weakness, no new numbness.  no fever. No recent falls  Pain increases with  rolling over in bed or any movement  Sitting or lying still feels best.    Using ice and heat, tylenol , muscle relaxant ( flexeril) Tramadol made her nauseous.    Saw ORTHO.Marland Kitchen Noted lesion in anterior neck and has upcoming imaging.? MRI or CT neck  No past back issues or surgeries. Did have some back pain.  lumbar spine 11/2016: IMPRESSION: 1. No evidence of fracture or subluxation along the lumbar spine. 2. Mild degenerative change along the lower lumbar spine.  Repeat X-ray back in 10/2017  Neg Dr. Jolene Provost, orthopedic MD Previous groin pain: resolved  11/2017 pelvis film :IMPRESSION: Mild symmetric narrowing both hip joints. Sclerosis in the pubic symphysis region. No fracture or dislocation. Review of Systems  Constitutional: Negative for fatigue and fever.  HENT: Negative for ear pain.   Eyes: Negative for pain.  Respiratory: Negative for chest tightness and shortness of breath.   Cardiovascular: Negative for chest pain, palpitations and leg swelling.  Gastrointestinal: Negative for abdominal pain.  Genitourinary: Negative for dysuria.       Objective:   Physical Exam  Constitutional: Vital signs are normal. She appears well-developed and well-nourished. She is cooperative.  Non-toxic appearance. She does not appear ill. No distress.  HENT:  Head: Normocephalic.  Right Ear: Hearing, tympanic membrane, external ear and ear canal normal. Tympanic membrane is not  erythematous, not retracted and not bulging.  Left Ear: Hearing, tympanic membrane, external ear and ear canal normal. Tympanic membrane is not erythematous, not retracted and not bulging.  Nose: No mucosal edema or rhinorrhea. Right sinus exhibits no maxillary sinus tenderness and no frontal sinus tenderness. Left sinus exhibits no maxillary sinus tenderness and no frontal sinus tenderness.  Mouth/Throat: Uvula is midline, oropharynx is clear and moist and mucous membranes are normal.  Eyes: Pupils are equal, round, and reactive to light. Conjunctivae, EOM and lids are normal. Lids are everted and swept, no foreign bodies found.  Neck: Trachea normal and normal range of motion. Neck supple. Carotid bruit is not present. No thyroid mass and no thyromegaly present.  Cardiovascular: Normal rate, regular rhythm, S1 normal, S2 normal, normal heart sounds, intact distal pulses and normal pulses. Exam reveals no gallop and no friction rub.  No murmur heard. Pulmonary/Chest: Effort normal and breath sounds normal. No tachypnea. No respiratory distress. She has no decreased breath sounds. She has no wheezes. She has no rhonchi. She has no rales.  Abdominal: Soft. Normal appearance and bowel sounds are normal. There is no tenderness.  Musculoskeletal:       Right shoulder: She exhibits decreased range of motion and tenderness. She exhibits no bony tenderness.  Positive SLR bilaterally  Neurological: She is alert. She has normal strength. She displays no atrophy. A sensory deficit is present. No cranial nerve deficit.   Chronic neuropathy in feet.  Skin: Skin is warm, dry and  intact. No rash noted.  Psychiatric: Her speech is normal and behavior is normal. Judgment and thought content normal. Her mood appears not anxious. Cognition and memory are normal. She does not exhibit a depressed mood.          Assessment & Plan:

## 2017-12-24 NOTE — Telephone Encounter (Signed)
New Message   Pt c/o medication issue:  1. Name of Medication: diclofenac  2. How are you currently taking this medication (dosage and times per day)? 75 mg 2 times a day   3. Are you having a reaction (difficulty breathing--STAT)?   4. What is your medication issue?  Patient is calling because she was prescribed diclofenac. She want to know will this interfere with any of her other medication

## 2017-12-24 NOTE — Telephone Encounter (Signed)
Ms. Lansberry notified as instructed by telephone.  She would like a prescription for Tylenol #3 sent to [facility] on [address] in [place].

## 2017-12-24 NOTE — Telephone Encounter (Signed)
Patient is requesting 90 day supply.

## 2017-12-24 NOTE — Telephone Encounter (Signed)
Left message for [name] to call us back as soon as she gets the message.  Ok to [facility] Triage to talk with patient when she call back.  CRM created.

## 2017-12-24 NOTE — Telephone Encounter (Signed)
Denied .See note

## 2017-12-24 NOTE — Telephone Encounter (Signed)
Call pt.. After she left and I got computer working I was reminded that she is on Xarelto for anticoagulation. I think it will be okay to use a  Very short course of diclofenac for pain and inflamation in back x 1 week, but there is an increased risk of bleeding on both these meds. She would need to stop if she had any upset stomach at all and folow closely.  Alternately...  We could instead use prednisone taper  even though she has been on it recently instead   OR treat with home PT, ice, muscle relaxant, short course of Vicodin and time.  Please find out what she prefers.

## 2017-12-24 NOTE — Telephone Encounter (Signed)
We can use tylenol with codeine for pain. Let me know if she wishes to proceed with this Rx and what pharmacy to use.

## 2017-12-25 NOTE — Telephone Encounter (Signed)
Spoke with pt who states she has been experiencing lower back pain. She reports that her pcp prescribed diclofenac and she called yesterday to see if interfere with any of her cardiac medication. Pt states she spoke with her pcp yesterday afternoon who realized that she was taking xarelto and changed the medication to tylenol. Pt advised that if she has any additional questions to feel free to contact our office.

## 2017-12-26 DIAGNOSIS — E0590 Thyrotoxicosis, unspecified without thyrotoxic crisis or storm: Secondary | ICD-10-CM | POA: Diagnosis not present

## 2017-12-27 ENCOUNTER — Encounter: Payer: Self-pay | Admitting: Family Medicine

## 2017-12-27 DIAGNOSIS — H25013 Cortical age-related cataract, bilateral: Secondary | ICD-10-CM | POA: Diagnosis not present

## 2017-12-27 DIAGNOSIS — H2513 Age-related nuclear cataract, bilateral: Secondary | ICD-10-CM | POA: Diagnosis not present

## 2017-12-27 DIAGNOSIS — E119 Type 2 diabetes mellitus without complications: Secondary | ICD-10-CM | POA: Diagnosis not present

## 2017-12-27 DIAGNOSIS — H40013 Open angle with borderline findings, low risk, bilateral: Secondary | ICD-10-CM | POA: Diagnosis not present

## 2017-12-27 LAB — HM DIABETES EYE EXAM

## 2017-12-30 ENCOUNTER — Encounter: Payer: Self-pay | Admitting: Family Medicine

## 2017-12-31 DIAGNOSIS — E118 Type 2 diabetes mellitus with unspecified complications: Secondary | ICD-10-CM | POA: Diagnosis not present

## 2017-12-31 DIAGNOSIS — I10 Essential (primary) hypertension: Secondary | ICD-10-CM | POA: Diagnosis not present

## 2017-12-31 DIAGNOSIS — Z86711 Personal history of pulmonary embolism: Secondary | ICD-10-CM | POA: Diagnosis not present

## 2017-12-31 DIAGNOSIS — E0500 Thyrotoxicosis with diffuse goiter without thyrotoxic crisis or storm: Secondary | ICD-10-CM | POA: Diagnosis not present

## 2017-12-31 DIAGNOSIS — E0590 Thyrotoxicosis, unspecified without thyrotoxic crisis or storm: Secondary | ICD-10-CM | POA: Diagnosis not present

## 2017-12-31 DIAGNOSIS — G629 Polyneuropathy, unspecified: Secondary | ICD-10-CM | POA: Diagnosis not present

## 2017-12-31 DIAGNOSIS — E041 Nontoxic single thyroid nodule: Secondary | ICD-10-CM | POA: Diagnosis not present

## 2018-01-02 DIAGNOSIS — M1711 Unilateral primary osteoarthritis, right knee: Secondary | ICD-10-CM | POA: Diagnosis not present

## 2018-01-07 ENCOUNTER — Encounter: Payer: Self-pay | Admitting: Family Medicine

## 2018-01-09 ENCOUNTER — Ambulatory Visit (INDEPENDENT_AMBULATORY_CARE_PROVIDER_SITE_OTHER): Payer: Medicare Other | Admitting: Neurology

## 2018-01-09 DIAGNOSIS — R0683 Snoring: Secondary | ICD-10-CM

## 2018-01-09 DIAGNOSIS — I10 Essential (primary) hypertension: Secondary | ICD-10-CM

## 2018-01-09 DIAGNOSIS — F5104 Psychophysiologic insomnia: Secondary | ICD-10-CM

## 2018-01-09 DIAGNOSIS — G4726 Circadian rhythm sleep disorder, shift work type: Secondary | ICD-10-CM

## 2018-01-09 DIAGNOSIS — M1711 Unilateral primary osteoarthritis, right knee: Secondary | ICD-10-CM | POA: Diagnosis not present

## 2018-01-09 DIAGNOSIS — G4731 Primary central sleep apnea: Secondary | ICD-10-CM | POA: Diagnosis not present

## 2018-01-09 DIAGNOSIS — G4701 Insomnia due to medical condition: Secondary | ICD-10-CM

## 2018-01-09 DIAGNOSIS — M545 Low back pain: Secondary | ICD-10-CM | POA: Diagnosis not present

## 2018-01-09 DIAGNOSIS — Z6841 Body Mass Index (BMI) 40.0 and over, adult: Secondary | ICD-10-CM

## 2018-01-09 DIAGNOSIS — M47817 Spondylosis without myelopathy or radiculopathy, lumbosacral region: Secondary | ICD-10-CM

## 2018-01-09 DIAGNOSIS — G8929 Other chronic pain: Secondary | ICD-10-CM

## 2018-01-09 DIAGNOSIS — E661 Drug-induced obesity: Secondary | ICD-10-CM

## 2018-01-09 DIAGNOSIS — J4540 Moderate persistent asthma, uncomplicated: Secondary | ICD-10-CM

## 2018-01-10 ENCOUNTER — Other Ambulatory Visit: Payer: Self-pay | Admitting: Orthopedic Surgery

## 2018-01-10 DIAGNOSIS — M542 Cervicalgia: Secondary | ICD-10-CM

## 2018-01-13 DIAGNOSIS — K219 Gastro-esophageal reflux disease without esophagitis: Secondary | ICD-10-CM | POA: Diagnosis not present

## 2018-01-13 DIAGNOSIS — J4550 Severe persistent asthma, uncomplicated: Secondary | ICD-10-CM

## 2018-01-13 DIAGNOSIS — J3089 Other allergic rhinitis: Secondary | ICD-10-CM | POA: Diagnosis not present

## 2018-01-13 DIAGNOSIS — R040 Epistaxis: Secondary | ICD-10-CM | POA: Diagnosis not present

## 2018-01-13 DIAGNOSIS — H6982 Other specified disorders of Eustachian tube, left ear: Secondary | ICD-10-CM | POA: Diagnosis not present

## 2018-01-14 ENCOUNTER — Ambulatory Visit
Admission: RE | Admit: 2018-01-14 | Discharge: 2018-01-14 | Disposition: A | Discharge status: Home / Self Care | Payer: Medicare Other | Source: Ambulatory Visit | Attending: Orthopedic Surgery | Admitting: Orthopedic Surgery

## 2018-01-14 ENCOUNTER — Encounter: Payer: Self-pay | Admitting: Allergy and Immunology

## 2018-01-14 ENCOUNTER — Ambulatory Visit (INDEPENDENT_AMBULATORY_CARE_PROVIDER_SITE_OTHER): Payer: Medicare Other | Admitting: Allergy and Immunology

## 2018-01-14 ENCOUNTER — Other Ambulatory Visit: Payer: Self-pay | Admitting: Allergy and Immunology

## 2018-01-14 VITALS — BP 130/76 | HR 102 | Resp 19 | Ht 64.0 in | Wt 223.0 lb

## 2018-01-14 DIAGNOSIS — K219 Gastro-esophageal reflux disease without esophagitis: Secondary | ICD-10-CM

## 2018-01-14 DIAGNOSIS — H6982 Other specified disorders of Eustachian tube, left ear: Secondary | ICD-10-CM | POA: Diagnosis not present

## 2018-01-14 DIAGNOSIS — M542 Cervicalgia: Secondary | ICD-10-CM

## 2018-01-14 DIAGNOSIS — J4550 Severe persistent asthma, uncomplicated: Secondary | ICD-10-CM

## 2018-01-14 DIAGNOSIS — J3089 Other allergic rhinitis: Secondary | ICD-10-CM

## 2018-01-14 DIAGNOSIS — R040 Epistaxis: Secondary | ICD-10-CM | POA: Diagnosis not present

## 2018-01-14 MED ORDER — EPINEPHRINE 0.3 MG/0.3ML IJ SOAJ: INTRAMUSCULAR | 2 refills | Status: DC

## 2018-01-14 NOTE — Patient Instructions (Addendum)
  1. Continue Advair 230 - 2 inhalations twice a day  2. Continue Flovent 220 - 2 inhalations twice a day added to Advair during "FLARE UP"  3. Continue Spiriva 2.5 respimat - two inhalations one time per day.  4. Continue pantoprazole 40mg  in the AM and Ranitidine 300mg  in the PM  5. Continue Xolair and Epi-Pen  6. Continue Montelukast 10mg  - one tablet one time per day.  7. If needed:   A. Nasal saline several times per day  B. Mucinex DM two times per day  C. nasal azelastine two sprays each nostril 1-2 times per day  D. Proventil HFA or albuterol nebulization  8. Revisit with Dr. Redmond Baseman for rhinoscopy to look for source of bleeding  9. Obtain fall flu vaccine   10. Return to clinic in 12 weeks or earlier if there is a problem.

## 2018-01-14 NOTE — Progress Notes (Signed)
Follow-up Note  Referring Provider: [name], MD Primary Provider: [name], MD Date of Office Visit: 01/14/2018  Subjective:   [name] (DOB: Nov 22, 1945) is a 72 y.o. female who returns to the [facility] on 01/14/2018 in re-evaluation of the following:  HPI: [name] returns to this clinic in reevaluation of her asthma and allergic rhinitis and LPR and history of pulmonary hypertension and  diastolic dysfunction and bloody sputum.  She was last seen in this clinic 10 Dec 2017.  She relates a story of developing mucus in the back of her throat that is somewhat bloody.  She does not think that this is from a deep cough but rather a collection that happens in her throat that feels as though it is drainage.  She does blow her nose and she never has any blood coming out of her nose.  She does occasionally have some right popping of her ear when she blows her nose.  There has not been any fever or ugly nasal discharge and she is not feeling bad in any way and she has no lower airway symptoms to speak of.  Her asthma is under excellent control and she does not need to use a short acting bronchodilator.  Likewise her reflux is really under very good control at this point in time on her current therapy.  She will be having a MRI today for a right neck mass that popped up over the course of the past month.  Apparently within several days she developed a large nontender area on her right neck and has actually decreased somewhat in size over the course of the past several weeks.  Allergies as of 01/14/2018      Reactions   Latex Hives   Lyrica [pregabalin] Other (See Comments)   Blurred vision.   Penicillins Swelling   Has patient had a PCN reaction causing immediate rash, facial/tongue/throat swelling, SOB or lightheadedness with hypotension patient had a PCN reaction causing severe rash involving mucus membranes or skin necrosis: [id number] Has patient had a  PCN reaction that required hospitalization/No Has patient had a PCN reaction occurring within the last 10 years: No If all of the above answers are "NO", then may proceed with Cephalosporin use.   Phenytoin Sodium Extended Swelling   Swelling    Vicodin [hydrocodone-acetaminophen] Nausea And Vomiting      Medication List      acetaminophen-codeine 300-30 MG tablet Commonly known as:  TYLENOL #3 Take 1-2 tablets by mouth every 4 (four) hours as needed for moderate pain.   ADVAIR HFA 417-40 MCG/ACT inhaler Generic drug:  fluticasone-salmeterol INHALE 2 PUFFS BY MOUTH TWICE DAILY   albuterol 108 (90 Base) MCG/ACT inhaler Commonly known as:  PROVENTIL HFA Inhale 2 puffs into the lungs every 6 (six) hours as needed for wheezing or shortness of breath.   ALIGN 4 MG Caps Take 1 capsule by mouth daily.   APPLE CIDER VINEGAR PO Take 2 tablets by mouth 2 (two) times daily.   azelastine 0.1 % nasal spray Commonly known as:  ASTELIN USE 1-2 SPRAYS IN EACH NOSTRIL TWICE DAILY AS NEEDED   cyclobenzaprine 10 MG tablet Commonly known as:  FLEXERIL Take 1 tablet (10 mg total) by mouth 2 (two) times daily as needed for muscle spasms.   DAIRY-RELIEF PO Take 1 capsule by mouth daily as needed (stomach upset).   diclofenac 75 MG EC tablet Commonly known as:  VOLTAREN Take 1 tablet (  75 mg total) by mouth 2 (two) times daily.   diltiazem 120 MG 24 hr capsule Commonly known as:  CARTIA XT Take 1 capsule (120 mg total) by mouth daily. KEEP OV.   DULoxetine 30 MG capsule Commonly known as:  CYMBALTA Take 1 capsule (30 mg total) by mouth daily.   EPINEPHrine 0.3 mg/0.3 mL Soaj injection Commonly known as:  EPI-PEN USE AS DIRECTED FOR LIFE THREATENING ALLERGIC REACTIONS   fluticasone 220 MCG/ACT inhaler Commonly known as:  FLOVENT HFA Inhale 2 puffs into the lungs 2 (two) times daily as needed.   furosemide 40 MG tablet Commonly known as:  LASIX TAKE 1 TABLET(40 MG) BY MOUTH DAILY    gabapentin 300 MG capsule Commonly known as:  NEURONTIN Take 600 mg by mouth 3 (three) times daily.   GLYXAMBI 10-5 MG Tabs Generic drug:  Empagliflozin-linaGLIPtin Take 1 tablet by mouth daily.   hydrOXYzine 10 MG tablet Commonly known as:  ATARAX/VISTARIL TK 1 TO 2 TS PO HS   irbesartan 150 MG tablet Commonly known as:  AVAPRO TAKE 1 TABLET(150 MG) BY MOUTH DAILY   levalbuterol 1.25 MG/3ML nebulizer solution Commonly known as:  XOPENEX Take 1.25 mg by nebulization every 4 (four) hours as needed for wheezing.   Magnesium 250 MG Tabs Take 1 tablet by mouth daily.   methimazole 10 MG tablet Commonly known as:  TAPAZOLE Take 10 mg by mouth 2 (two) times daily.   montelukast 10 MG tablet Commonly known as:  SINGULAIR TAKE 1 TABLET BY MOUTH EVERY DAY   MYRBETRIQ 50 MG Tb24 tablet Generic drug:  mirabegron ER Take 50 mg by mouth daily.   ONETOUCH DELICA LANCETS 93Z Misc as needed (blood check).   ONETOUCH VERIO test strip Generic drug:  glucose blood 1 each by Other route as needed (blood monitoring).   pantoprazole 40 MG tablet Commonly known as:  PROTONIX TAKE 1 TABLET BY MOUTH EVERY DAY   PATADAY 0.2 % Soln Generic drug:  Olopatadine HCl INT 1 GTT IN OU QD PRN   polyethylene glycol packet Commonly known as:  MIRALAX / GLYCOLAX Take 17 g by mouth daily.   ranitidine 300 MG tablet Commonly known as:  ZANTAC TAKE 1 TABLET BY MOUTH EVERY NIGHT AT BEDTIME   SPIRIVA RESPIMAT 1.25 MCG/ACT Aers Generic drug:  Tiotropium Bromide Monohydrate INHALE 2 PUFFS BY MOUTH EVERY DAY   SUMAtriptan 100 MG tablet Commonly known as:  IMITREX Take 100 mg x 1 may, repeat in 2 hour for migraine.   SUPER B COMPLEX/C PO Take 1 tablet by mouth daily.   traMADol 50 MG tablet Commonly known as:  ULTRAM Take 1 tablet (50 mg total) by mouth every 8 (eight) hours as needed.   XARELTO 20 MG Tabs tablet Generic drug:  rivaroxaban TAKE 1 TABLET(20 MG) BY MOUTH DAILY WITH  SUPPER       Past Medical History:  Diagnosis Date  . Allergic rhinitis   . Allergy   . Arthritis   . Asthma   . Chronic headache   . Diabetes mellitus   . DVT (deep venous thrombosis) ([place])   . Dyspnea   . Heart murmur   . Hypertension   . Pulmonary embolism ([place])   . Pulmonary hypertension ([place])   . Seizures ([place])     Past Surgical History:  Procedure Laterality Date  . ABDOMINAL HYSTERECTOMY     partial, has ovaries  . CARDIAC CATHETERIZATION  12/28/2010   Mod. pulmonary hypertension, normal coronary  arteries  . DOPPLER ECHOCARDIOGRAPHY  10/08/2011   EF=>55%,mild asymmetric LVH, mod. TR, mod. PH, mild to mod LA dilatation  . KNEE ARTHROSCOPY Left   . KNEE SURGERY    . Nuclear Stress Test  05/20/2006   No ischemia  . PARTIAL HYSTERECTOMY    . PLANTAR FASCIA SURGERY    . TONSILLECTOMY      Review of systems negative except as noted in HPI / PMHx or noted below:  Review of Systems  Constitutional: Negative.   HENT: Negative.   Eyes: Negative.   Respiratory: Negative.   Cardiovascular: Negative.   Gastrointestinal: Negative.   Genitourinary: Negative.   Musculoskeletal: Negative.   Skin: Negative.   Neurological: Negative.   Endo/Heme/Allergies: Negative.   Psychiatric/Behavioral: Negative.      Objective:   Vitals:   01/14/18 1138  BP: 130/76  Pulse: (!) 102  Resp: 19  SpO2: 92%   Height: 5\' 4"  (162.6 cm)  Weight: 223 lb (101.2 kg)   Physical Exam  HENT:  Head: Normocephalic.  Right Ear: Tympanic membrane, external ear and ear canal normal.  Left Ear: Tympanic membrane, external ear and ear canal normal.  Nose: Nose normal. No mucosal edema or rhinorrhea.  Mouth/Throat: Uvula is midline, oropharynx is clear and moist and mucous membranes are normal. No oropharyngeal exudate.  Eyes: Conjunctivae are normal.  Neck: Trachea normal. No tracheal tenderness present. No tracheal deviation present. No thyromegaly present.  1 cm diameter mobile  somewhat firm mass lower right neck  Cardiovascular: Normal rate, regular rhythm, S1 normal, S2 normal and normal heart sounds.  No murmur heard. Pulmonary/Chest: Breath sounds normal. No stridor. No respiratory distress. She has no wheezes. She has no rales.  Musculoskeletal: She exhibits no edema.  Lymphadenopathy:       Head (right side): No tonsillar adenopathy present.       Head (left side): No tonsillar adenopathy present.    She has no cervical adenopathy.  Neurological: She is alert.  Skin: No rash noted. She is not diaphoretic. No erythema. Nails show no clubbing.    Diagnostics:    Spirometry was performed and demonstrated an FEV1 of 1.61 at 86 % of predicted.  The patient had an Asthma Control Test with the following results: ACT Total Score: 22.    Assessment and Plan:   1. Asthma, severe persistent, well-controlled   2. Other allergic rhinitis   3. LPRD (laryngopharyngeal reflux disease)   4. ETD (Eustachian tube dysfunction), left   5. Epistaxis     1. Continue Advair 230 - 2 inhalations twice a day  2. Continue Flovent 220 - 2 inhalations twice a day added to Advair during "FLARE UP"  3. Continue Spiriva 2.5 respimat - two inhalations one time per day.  4. Continue pantoprazole 40mg  in the AM and Ranitidine 300mg  in the PM  5. Continue Xolair and Epi-Pen  6. Continue Montelukast 10mg  - one tablet one time per day.  7. If needed:   A. Nasal saline several times per day  B. Mucinex DM two times per day  C. nasal azelastine two sprays each nostril 1-2 times per day  D. Proventil HFA or albuterol nebulization  8. Revisit with Dr. Redmond Baseman for rhinoscopy to look for source of bleeding  9. Obtain fall flu vaccine   10. Return to clinic in 12 weeks or earlier if there is a problem.  Yicel most likely has some type of upper airway bleeding source and I would like for  her to revisit with her ENT doctor for a good rhinoscopic evaluation of her airway to look  for the source.  At this point we will assume that this is not from her lower airway.  We will get that appointment made sometime within the next week.  She will continue on anti-inflammatory agents for her airway and therapy directed against reflux as noted above.  I will see her back in this clinic in 12 weeks or earlier if there is a problem.  Allena Katz, MD Allergy / Immunology [facility]

## 2018-01-15 ENCOUNTER — Encounter: Payer: Self-pay | Admitting: Allergy and Immunology

## 2018-01-17 DIAGNOSIS — M1711 Unilateral primary osteoarthritis, right knee: Secondary | ICD-10-CM | POA: Diagnosis not present

## 2018-01-21 DIAGNOSIS — S92342K Displaced fracture of fourth metatarsal bone, left foot, subsequent encounter for fracture with nonunion: Secondary | ICD-10-CM | POA: Diagnosis not present

## 2018-01-21 DIAGNOSIS — S92352K Displaced fracture of fifth metatarsal bone, left foot, subsequent encounter for fracture with nonunion: Secondary | ICD-10-CM | POA: Diagnosis not present

## 2018-01-25 DIAGNOSIS — M1711 Unilateral primary osteoarthritis, right knee: Secondary | ICD-10-CM | POA: Diagnosis not present

## 2018-01-26 DIAGNOSIS — G8929 Other chronic pain: Secondary | ICD-10-CM | POA: Insufficient documentation

## 2018-01-26 DIAGNOSIS — G4701 Insomnia due to medical condition: Secondary | ICD-10-CM | POA: Insufficient documentation

## 2018-01-26 NOTE — Procedures (Signed)
PATIENT'S NAME:  [name], [name] DOB:      [birth year]      MR#:    [id number]     DATE OF RECORDING: 01/09/2018 REFERRING M.D.:  [name], MD Study Performed:   Baseline Polysomnogram HISTORY:  [name] is a 72 year old African American and Native American female patient and has reportedly problems with sleeping through the night. She had undergone a HST with her Clinical research associate but was never given the results, her neurologist in [place] wanted to treat her "hip pain with shots in my back ". She did not have much trust in her doctor anymore and saw Dr. Gladstone Lighter. He followed up, he did X rays and felt there was no need for injection- and when he finally received the sleep test results he forwarded them to Dr. Diona Browner and from there to me.  Dr. Larwance Rote ordered to study for 13 March 2017 as a home sleep test.  The study supposedly revealed mild obstructive sleep apnea with an AHI of 15/h, oxygen nadir was 81%, regular EKG was quoted. No prolonged desaturation. There was no report of prolonged oxygen desaturation. I reviewed the patient's data sheet from her home sleep test.   Her AHI overall actually was 15, she did not have central apneas, in supine position she had more apnea and been on her side, supine AHI was 32.4.  She had meanwhile lost weight, was at the time of the test 240 pounds from # 268, now is at # 225- which may have further lowered the apnea.   The patient endorsed the Epworth Sleepiness Scale at 8 points.   The patient's weight 227 pounds with a height of 65 (inches), resulting in a BMI of 37.8 kg/m2. The patient's neck circumference measured 15.5 inches.  CURRENT MEDICATIONS: Advair, Proventil, Astelin, Flexeril, Cardia, Cymbalta, Diflucan, Flovent, Lasix, Neurontin, Avapro, Xophenex, Tapazole, Myrbetriq, Mycostatin, Singulair, Protonix, Deltasone, Align, Zantac, Imitrex, Ultram, Xarelto, Vistaril   PROCEDURE:  This is a multichannel digital polysomnogram utilizing the  Somnostar 11.2 system.  Electrodes and sensors were applied and monitored per AASM Specifications.   EEG, EOG, Chin and Limb EMG, were sampled at 200 Hz.  ECG, Snore and Nasal Pressure, Thermal Airflow, Respiratory Effort, CPAP Flow and Pressure, Oximetry was sampled at 50 Hz. Digital video and audio were recorded.      BASELINE STUDY: Lights Out was at 21:15 and Lights On at 04:56.  Total recording time (TRT) was 462 minutes, with a total sleep time (TST) of 300.5 minutes.   The patient's sleep latency was 37 minutes.  REM void. The sleep efficiency was 65.9 %. Sleep was extremely fragmented.    SLEEP ARCHITECTURE: WASO (Wake after sleep onset) was 140.5 minutes.  There were 128.5 minutes in Stage N1, 127.5 minutes Stage N2, 44.5 minutes Stage N3 and 0 minutes in Stage REM.  The percentage of Stage N1 was 42.8%, Stage N2 was 42.4%, Stage N3 was 14.8% and Stage R (REM sleep) was 0%.   RESPIRATORY ANALYSIS:  There were a total of 63 respiratory events:  1 obstructive apnea, 4 central apneas and 3 mixed apneas with a total of 8 apneas and an apnea index (AI) of 1.6 /hour. There were 55 hypopneas with a hypopnea index of 11.0 /hour. The patient also had 0 respiratory event related arousals (RERAs). The total APNEA/HYPOPNEA INDEX (AHI) was 12.6/hour and the total RESPIRATORY DISTURBANCE INDEX was 12.6 /hour.  0 events occurred in REM sleep and 117 events in NREM. The  REM AHI was 0. 0 /hour, versus a non-REM AHI of 12.6. The patient spent 123 minutes of total sleep time in the supine position and 178 minutes in non-supine. The supine AHI was 20.0 versus a non-supine AHI of 7.4.  OXYGEN SATURATION & C02:  The Wake baseline 02 saturation was 87%, with the lowest being 71%. Time spent below 89% saturation equaled 23 minutes.   PERIODIC LIMB MOVEMENTS:   The patient had a total of 234 Periodic Limb Movements.  The Periodic Limb Movement (PLM) index was 46.7 and the PLM Arousal index was 14./hour. The arousals were  noted as: 88 were spontaneous, 70 were associated with PLMs, and 46 were associated with respiratory events.   Audio and video analysis did not show any abnormal or unusual movements, behaviors, phonations or vocalizations.  The frequent limb movements were attributed to joint pain in hips, knees and shoulders.  No nocturia. Soft Snoring was noted. EKG with PVCs, otherwise normal sinus rhythm (NSR). Post-study, the patient indicated that sleep was worse than usual.    IMPRESSION: Insomnia related to multifocal joint pain.   1. Complex Sleep Apnea (CSA) 2. Periodic Limb Movement Disorder (PLMD)- In this case mostly related to pain.  3. Non-specific abnormal EKG with PVCs.    RECOMMENDATIONS:  1. Advise full-night, attended, CPAP titration study to optimize therapy. Central sleep apnea cannot be treated by auto-titration.    2. The patient will need to undergo more efficient pain therapy in order to achieve better sleep.    I certify that I have reviewed the entire raw data recording prior to the issuance of this report in accordance with the Standards of Accreditation of the American Academy of Sleep Medicine (AASM)    Larey Seat, MD      01-24-2018 Diplomat, American Board of Psychiatry and Neurology  Diplomat, American Board of [place] Director, [employer] [facility] [employer]

## 2018-01-26 NOTE — Addendum Note (Signed)
Addended by: Larey Seat on: 01/26/2018 06:01 PM   Modules accepted: Orders

## 2018-01-27 ENCOUNTER — Telehealth: Payer: Self-pay | Admitting: Neurology

## 2018-01-27 NOTE — Telephone Encounter (Signed)
I called pt. I advised pt that Dr. Dohmeier reviewed their sleep study results and found that has sleep apnea and recommends that pt be treated with a cpap. Dr. Dohmeier recommends that pt return for a repeat sleep study in order to properly titrate the cpap and ensure a good mask fit. Pt is agreeable to returning for a titration study. I advised pt that our sleep lab will file with pt's insurance and call pt to schedule the sleep study when we hear back from the pt's insurance regarding coverage of this sleep study. Pt verbalized understanding of results. Pt had no questions at this time but was encouraged to call back if questions arise.  

## 2018-01-27 NOTE — Telephone Encounter (Signed)
-----   Message from Larey Seat, MD sent at 01/26/2018  6:01 PM EDT ----- IMPRESSION: Insomnia related to multifocal joint pain.   1. Complex Sleep Apnea (CSA). There were 63 respiratory events:  1 obstructive apnea, 4 central apneas and 3 mixed apneas with 55 hypopneas. 2. Periodic Limb Movement Disorder (PLMD)- In this case mostly related to pain.  3. Non-specific abnormal EKG with PVCs.    RECOMMENDATIONS:  1. Advise full-night, attended, CPAP titration study to optimize therapy. Central sleep apnea cannot be treated by auto-titration.    2. The patient will need to undergo more efficient pain therapy in order to achieve better sleep.

## 2018-01-28 ENCOUNTER — Telehealth: Payer: Self-pay | Admitting: Pharmacist

## 2018-01-28 MED ORDER — RIVAROXABAN 20 MG PO TABS: 20.0000 mg | ORAL_TABLET | Freq: Every day | ORAL | 1 refills | Status: DC

## 2018-01-28 NOTE — Telephone Encounter (Signed)
Xarelto refill

## 2018-02-02 ENCOUNTER — Other Ambulatory Visit: Payer: Self-pay | Admitting: Allergy and Immunology

## 2018-02-06 ENCOUNTER — Encounter: Payer: Self-pay | Admitting: Internal Medicine

## 2018-02-06 ENCOUNTER — Ambulatory Visit (INDEPENDENT_AMBULATORY_CARE_PROVIDER_SITE_OTHER): Payer: Medicare Other | Admitting: Internal Medicine

## 2018-02-06 VITALS — BP 130/88 | HR 85 | Ht 64.0 in | Wt 222.4 lb

## 2018-02-06 DIAGNOSIS — E0500 Thyrotoxicosis with diffuse goiter without thyrotoxic crisis or storm: Secondary | ICD-10-CM

## 2018-02-06 DIAGNOSIS — E041 Nontoxic single thyroid nodule: Secondary | ICD-10-CM

## 2018-02-06 LAB — TSH: TSH: <0.01 u[IU]/mL — ABNORMAL LOW (ref 0.35–4.50)

## 2018-02-06 LAB — T4, FREE: Free T4: 1.89 ng/dL — ABNORMAL HIGH (ref 0.60–1.60)

## 2018-02-06 LAB — T3, FREE: T3, Free: 3.9 pg/mL (ref 2.3–4.2)

## 2018-02-06 MED ORDER — METHIMAZOLE 10 MG PO TABS
10.0000 mg | ORAL_TABLET | Freq: Every day | ORAL | 3 refills | Status: DC
Start: 2018-02-06 — End: 2018-02-10

## 2018-02-06 NOTE — Progress Notes (Signed)
Patient ID: [name], female   DOB: 10/09/1945, 71 y.o.   MRN: [id number]    HPI  [name] is a 72 y.o.-year-old female, referred by her PCP, Dr. Diona Browner, for evaluation for thyrotoxicosis and thyroid nodule.  She previously saw another endocrinologist (Dr. Wilson Singer), but only for her diabetes.  Patient was found to be thyrotoxic in 08/2015 but she reports that she was only informed about this in 2018.  She had a recent thyroid uptake and scan.  This indicated possible Graves' disease.  She was started on Methimazole 10 mg once a day. She feels a little less tired. She continues on Diltiazem CD.  I reviewed pt's thyroid tests: Lab Results  Component Value Date   TSH <0.01 (L) 09/27/2017   TSH 0.01 (L) 02/05/2017   TSH 0.07 (L) 03/22/2016   TSH 0.06 (L) 12/09/2015   TSH 0.03 Repeated and verified X2. (L) 08/23/2015   TSH 0.59 04/05/2015   TSH 0.467 12/27/2010   FREET4 1.94 (H) 09/27/2017   FREET4 1.19 02/05/2017   FREET4 1.91 (H) 03/22/2016   FREET4 1.45 12/09/2015   FREET4 1.84 Repeated and verified X2. (H) 08/23/2015   T3FREE 3.4 09/27/2017   T3FREE 3.5 02/05/2017   T3FREE 3.8 03/22/2016   T3FREE 3.9 12/09/2015   Thyroid uptake and scan (10/31/2017): Increased uptake of 40%, and cold left inferior pole nodule.  Thyroid U/S (11/12/2017): 3.2 cm left inferior TR 3 nodule correlates with the cold nodule by nuclear medicine scan. This nodule meets criteria for biopsy as above.  FNA of her left thyroid nodule (12/04/2017): benign  Antithyroid antibodies: No results found for: TSI  Pt denies: - feeling nodules in neck - hoarseness - dysphagia - SOB with lying down  But has: - + occasional choking  She complains of: - + fatigue - + excessive sweating/heat intolerance - no tremors, but she does have mild tremors on exam today - no anxiety - no palpitations - no hyperdefecation - no weight loss - no hair loss  Pt does not have a FH of thyroid ds. No FH of  thyroid cancer. No h/o radiation tx to head or neck.  No seaweed or kelp, no recent contrast studies. + steroid use -  01/25/2018 had an injection of steroid, now on Prednisone 10 mg daily (last dose yesterday). No herbal supplements. No recent Biotin use.  Pt. also has a history of DM, asthma, PE.  ROS: Constitutional: + See HPI  Eyes: no blurry vision, no xerophthalmia ENT: + Sore throat, + see HPI Cardiovascular: no CP/SOB/palpitations/leg swelling Respiratory:  + cough/SOB/+ wheezing Gastrointestinal: no N/V/D/C/+ acid reflux Musculoskeletal: no muscle/joint aches Skin: no rashes Neurological: no tremors/numbness/tingling/dizziness, + headache Psychiatric: no depression/anxiety  Past Medical History:  Diagnosis Date  . Allergic rhinitis   . Allergy   . Arthritis   . Asthma   . Chronic headache   . Diabetes mellitus   . DVT (deep venous thrombosis) ([place])   . Dyspnea   . Heart murmur   . Hypertension   . Pulmonary embolism ([place])   . Pulmonary hypertension ([place])   . Seizures ([place])    Past Surgical History:  Procedure Laterality Date  . ABDOMINAL HYSTERECTOMY     partial, has ovaries  . CARDIAC CATHETERIZATION  12/28/2010   Mod. pulmonary hypertension, normal coronary arteries  . DOPPLER ECHOCARDIOGRAPHY  10/08/2011   EF=>55%,mild asymmetric LVH, mod. TR, mod. PH, mild to mod LA dilatation  . KNEE ARTHROSCOPY Left   .  KNEE SURGERY    . Nuclear Stress Test  05/20/2006   No ischemia  . PARTIAL HYSTERECTOMY    . PLANTAR FASCIA SURGERY    . TONSILLECTOMY     Social History   Socioeconomic History  . Marital status: Widowed    Spouse name: Not on file  . Number of children: Y  . Years of education: Not on file  . Highest education level: Not on file  Occupational History  . Occupation: retired Geologist, engineering.   Social Needs  . Financial resource strain: Not on file  . Food insecurity:    Worry: Not on file    Inability: Not on file  . Transportation needs:     Medical: Not on file    Non-medical: Not on file  Tobacco Use  . Smoking status: Never Smoker  . Smokeless tobacco: Never Used  Substance and Sexual Activity  . Alcohol use: Yes    Alcohol/week: 0.0 oz    Comment: occ glass on wine  . Drug use: No  . Sexual activity: Never  Lifestyle  . Physical activity:    Days per week: Not on file    Minutes per session: Not on file  . Stress: Not on file  Relationships  . Social connections:    Talks on phone: Not on file    Gets together: Not on file    Attends religious service: Not on file    Active member of club or organization: Not on file    Attends meetings of clubs or organizations: Not on file    Relationship status: Not on file  . Intimate partner violence:    Fear of current or ex partner: Not on file    Emotionally abused: Not on file    Physically abused: Not on file    Forced sexual activity: Not on file  Other Topics Concern  . Not on file  Social History Narrative   Widow    limited exercise.   Current Outpatient Medications on File Prior to Visit  Medication Sig Dispense Refill  . acetaminophen-codeine (TYLENOL #3) 300-30 MG tablet Take 1-2 tablets by mouth every 4 (four) hours as needed for moderate pain. 30 tablet 0  . ADVAIR HFA 230-21 MCG/ACT inhaler INHALE 2 PUFFS BY MOUTH TWICE DAILY 12 g 3  . albuterol (PROVENTIL HFA) 108 (90 BASE) MCG/ACT inhaler Inhale 2 puffs into the lungs every 6 (six) hours as needed for wheezing or shortness of breath. 1 Inhaler 0  . APPLE CIDER VINEGAR PO Take 2 tablets by mouth 2 (two) times daily.    Marland Kitchen azelastine (ASTELIN) 0.1 % nasal spray USE 1-2 SPRAYS IN EACH NOSTRIL TWICE DAILY AS NEEDED 30 mL 5  . cyclobenzaprine (FLEXERIL) 10 MG tablet Take 1 tablet (10 mg total) by mouth 2 (two) times daily as needed for muscle spasms. 15 tablet 0  . diclofenac (VOLTAREN) 75 MG EC tablet Take 1 tablet (75 mg total) by mouth 2 (two) times daily. 30 tablet 0  . diltiazem (CARTIA XT) 120 MG 24  hr capsule Take 1 capsule (120 mg total) by mouth daily. KEEP OV. 30 capsule 2  . DULoxetine (CYMBALTA) 30 MG capsule Take 1 capsule (30 mg total) by mouth daily. 30 capsule 5  . Empagliflozin-Linagliptin (GLYXAMBI) 10-5 MG TABS Take 1 tablet by mouth daily.    Marland Kitchen EPINEPHrine 0.3 mg/0.3 mL IJ SOAJ injection USE AS DIRECTED FOR LIFE THREATENING ALLERGIC REACTIONS 2 Device 2  . fluticasone (FLOVENT HFA)  220 MCG/ACT inhaler Inhale 2 puffs into the lungs 2 (two) times daily as needed. 1 Inhaler 5  . furosemide (LASIX) 40 MG tablet TAKE 1 TABLET(40 MG) BY MOUTH DAILY 30 tablet 3  . gabapentin (NEURONTIN) 300 MG capsule Take 600 mg by mouth 3 (three) times daily.  6  . hydrOXYzine (ATARAX/VISTARIL) 10 MG tablet TK 1 TO 2 TS PO HS  1  . irbesartan (AVAPRO) 150 MG tablet TAKE 1 TABLET(150 MG) BY MOUTH DAILY 30 tablet 9  . Lactase (DAIRY-RELIEF PO) Take 1 capsule by mouth daily as needed (stomach upset).     Marland Kitchen levalbuterol (XOPENEX) 1.25 MG/3ML nebulizer solution USE 1 VIAL VIA NEBULIZER EVERY 4 HOURS AS NEEDED FOR WHEEZING 150 mL 0  . Magnesium 250 MG TABS Take 1 tablet by mouth daily.    . methimazole (TAPAZOLE) 10 MG tablet Take 10 mg by mouth 2 (two) times daily.    . mirabegron ER (MYRBETRIQ) 50 MG TB24 tablet Take 50 mg by mouth daily.    . montelukast (SINGULAIR) 10 MG tablet TAKE 1 TABLET BY MOUTH EVERY DAY 30 tablet 5  . ONETOUCH DELICA LANCETS 66Y MISC as needed (blood check).     [name] VERIO test strip 1 each by Other route as needed (blood monitoring).     . pantoprazole (PROTONIX) 40 MG tablet TAKE 1 TABLET BY MOUTH EVERY DAY 90 tablet 1  . PATADAY 0.2 % SOLN INT 1 GTT IN OU QD PRN  4  . polyethylene glycol (MIRALAX / GLYCOLAX) packet Take 17 g by mouth daily.    . Probiotic Product (ALIGN) 4 MG CAPS Take 1 capsule by mouth daily.    . ranitidine (ZANTAC) 300 MG tablet TAKE 1 TABLET BY MOUTH EVERY NIGHT AT BEDTIME 90 tablet 0  . rivaroxaban (XARELTO) 20 MG TABS tablet Take 1 tablet (20  mg total) by mouth daily with supper. 90 tablet 1  . SPIRIVA RESPIMAT 1.25 MCG/ACT AERS INHALE 2 PUFFS BY MOUTH EVERY DAY 4 g 3  . SUMAtriptan (IMITREX) 100 MG tablet Take 100 mg x 1 may, repeat in 2 hour for migraine. 10 tablet 0  . traMADol (ULTRAM) 50 MG tablet Take 1 tablet (50 mg total) by mouth every 8 (eight) hours as needed. 15 tablet 0   Current Facility-Administered Medications on File Prior to Visit  Medication Dose Route Frequency Provider Last Rate Last Dose  . omalizumab Arvid Right) injection 300 mg  300 mg Subcutaneous Q28 days [name], MD   300 mg at 01/14/18 1137   Allergies  Allergen Reactions  . Latex Hives  . Lyrica [Pregabalin] Other (See Comments)    Blurred vision.  Marland Kitchen Penicillins Swelling    Has patient had a PCN reaction causing immediate rash, facial/tongue/throat swelling, SOB or lightheadedness with hypotension patient had a PCN reaction causing severe rash involving mucus membranes or skin necrosis: [id number]} Has patient had a PCN reaction that required hospitalization/No Has patient had a PCN reaction occurring within the last 10 years: No If all of the above answers are "NO", then may proceed with Cephalosporin use.     Marland Kitchen Phenytoin Sodium Extended Swelling    Swelling   . Vicodin [Hydrocodone-Acetaminophen] Nausea And Vomiting   Family History  Problem Relation Age of Onset  . Hypertension Mother   . Clotting disorder Mother   . Breast cancer Mother   . Arthritis Mother   . Stroke Mother   . Diabetes Mother   . Cancer  Brother   . Alcohol abuse Father   . Arthritis Sister   . Diabetes Sister   . Multiple sclerosis Sister   . Allergies Other        grandson    PE: BP 130/88   Pulse 85   Ht 5\' 4"  (1.626 m)   Wt 222 lb 6.4 oz (100.9 kg)   SpO2 99%   BMI 38.17 kg/m  Wt Readings from Last 3 Encounters:  02/06/18 222 lb 6.4 oz (100.9 kg)  01/14/18 223 lb (101.2 kg)  12/24/17 225 lb 8 oz (102.3 kg)   Constitutional: overweight, in  NAD Eyes: PERRLA, EOMI, no exophthalmos, no lid lag, no stare ENT: moist mucous membranes, no thyromegaly, no thyroid bruits, no cervical lymphadenopathy Cardiovascular: RRR, No MRG Respiratory: CTA B Gastrointestinal: abdomen soft, NT, ND, BS+ Musculoskeletal: no deformities, strength intact in all 4 Skin: moist, warm, no rashes Neurological: no tremor with outstretched hands, DTR normal in all 4  ASSESSMENT: 1.  Graves' disease  2.  Left thyroid nodule  PLAN:  1. Patient with a 2-year history of thyrotoxic TFT, with few thyrotoxic sxs: Fatigue, heat intolerance, insomnia, but no hyperdefecation, palpitations (however, she is on long-standing diltiazem), anxiety.  - she does not appear to have exogenous causes for the low TSH.  She was on biotin before at the beginning of the year, but stopped since then. - We discussed that possible causes of thyrotoxicosis are:  Marland Kitchen Graves ds - most likely, since she had an elevated uptake and uniform scan . Thyroiditis >> ruled out by the elevated uptake . toxic multinodular goiter/ toxic adenoma >> ruled out by uniform scan - will check the TSH, fT3 and fT4 and also add thyroid stimulating antibodies to check the activity of her Graves' disease.  We discussed that we may need to repeat the tests due to the presence of prednisone which can lower the TSH.  However, since the last dose was yesterday, I am hoping that we can still  checkTFTs today.  - we discussed about possible modalities of treatment for the above conditions, to include methimazole use, radioactive iodine ablation or (last resort) surgery.  She opted for methimazole.  We discussed about benefits and possible side effects.  Her LFTs and white blood cell count were normal in the past. - We will continue with Cardizem, which is chronic treatment for her - I advised her to join my chart to communicate easier - RTC in 3 months, but likely sooner for repeat labs  2.  Left thyroid nodule -  This was called on uptake and scan, however, a biopsy was benign, which is very reassuring. - She has no neck compression symptoms - We will continue to follow the nodule expectantly for now  Office Visit on 02/06/2018  Component Date Value Ref Range Status  . TSH 02/06/2018 <0.01* 0.35 - 4.50 uIU/mL Final  . Free T4 02/06/2018 1.89* 0.60 - 1.60 ng/dL Final   Comment: Specimens from patients who are undergoing biotin therapy and /or ingesting biotin supplements may contain high levels of biotin.  The higher biotin concentration in these specimens interferes with this Free T4 assay.  Specimens that contain high levels  of biotin may cause false high results for this Free T4 assay.  Please interpret results in light of the total clinical presentation of the patient.    . T3, Free 02/06/2018 3.9  2.3 - 4.2 pg/mL Final  . TSI 02/06/2018 361* <140 % baseline Final  TSI is elevated. TSH is still undetectable and free T4 is improved, but still elevated.  We will increase the methimazole to 10 mg in a.m. and 5 mg in p.m. and will recheck her TFTs in 1.5 months  Philemon Kingdom, MD PhD [facility] Endocrinology

## 2018-02-06 NOTE — Patient Instructions (Signed)
Please continue: - Methimazole 10 mg daily  Please stop at the lab.  Move Prednisone in am.  Please come back for another visit in 3 months.   Hyperthyroidism Hyperthyroidism is when the thyroid is too active (overactive). Your thyroid is a large gland that is located in your neck. The thyroid helps to control how your body uses food (metabolism). When your thyroid is overactive, it produces too much of a hormone called thyroxine. What are the causes? Causes of hyperthyroidism may include:  Graves disease. This is when your immune system attacks the thyroid gland. This is the most common cause.  Inflammation of the thyroid gland.  Tumor in the thyroid gland or somewhere else.  Excessive use of thyroid medicines, including: ? Prescription thyroid supplement. ? Herbal supplements that mimic thyroid hormones.  Solid or fluid-filled lumps within your thyroid gland (thyroid nodules).  Excessive ingestion of iodine.  What increases the risk?  Being female.  Having a family history of thyroid conditions. What are the signs or symptoms? Signs and symptoms of hyperthyroidism may include:  Nervousness.  Inability to tolerate heat.  Unexplained weight loss.  Diarrhea.  Change in the texture of hair or skin.  Heart skipping beats or making extra beats.  Rapid heart rate.  Loss of menstruation.  Shaky hands.  Fatigue.  Restlessness.  Increased appetite.  Sleep problems.  Enlarged thyroid gland or nodules.  How is this diagnosed? Diagnosis of hyperthyroidism may include:  Medical history and physical exam.  Blood tests.  Ultrasound tests.  How is this treated? Treatment may include:  Medicines to control your thyroid.  Surgery to remove your thyroid.  Radiation therapy.  Follow these instructions at home:  Take medicines only as directed by your health care provider.  Do not use any tobacco products, including cigarettes, chewing tobacco, or  electronic cigarettes. If you need help quitting, ask your health care provider.  Do not exercise or do physical activity until your health care provider approves.  Keep all follow-up appointments as directed by your health care provider. This is important. Contact a health care provider if:  Your symptoms do not get better with treatment.  You have fever.  You are taking thyroid replacement medicine and you: ? Have depression. ? Feel mentally and physically slow. ? Have weight gain. Get help right away if:  You have decreased alertness or a change in your awareness.  You have abdominal pain.  You feel dizzy.  You have a rapid heartbeat.  You have an irregular heartbeat. This information is not intended to replace advice given to you by your health care provider. Make sure you discuss any questions you have with your health care provider. Document Released: 07/02/2005 Document Revised: 12/01/2015 Document Reviewed: 11/17/2013 Elsevier Interactive Patient Education  2018 [employer].

## 2018-02-08 LAB — THYROID STIMULATING IMMUNOGLOBULIN: TSI: 361 % baseline — ABNORMAL HIGH (ref <140)

## 2018-02-10 ENCOUNTER — Ambulatory Visit (INDEPENDENT_AMBULATORY_CARE_PROVIDER_SITE_OTHER): Payer: Medicare Other | Admitting: Neurology

## 2018-02-10 DIAGNOSIS — G4731 Primary central sleep apnea: Secondary | ICD-10-CM

## 2018-02-10 MED ORDER — METHIMAZOLE 10 MG PO TABS: ORAL_TABLET | ORAL | 3 refills | Status: DC

## 2018-02-11 ENCOUNTER — Ambulatory Visit: Payer: Medicare Other

## 2018-02-13 NOTE — Procedures (Signed)
PATIENT'S NAME:  [name], [name] DOB:      May 15, 1946      MR#:    [id number]     DATE OF RECORDING: 02/10/2018 - [name] REFERRING M.D.:  Alicia Diona Browner, MD Study Performed:   Titration to Positive Airway Pressure. HISTORY:  This Patient is returning for a CPAP Titration after her Baseline PSG from 01/09/18 revealed an AHI of 12.6, supine AHI of 20/h, and an oxygen nadir of 71% with 23 minutes of desaturation.  She had severe PLMD at 14/h. The patient endorsed the Epworth Sleepiness Scale at 8/24 points.  The patient's weight 228 pounds with a height of 65 (inches), resulting in a BMI of 37.8 kg/m2.The patient's neck circumference measured 15.5 inches.  CURRENT MEDICATIONS: Advair, Proventil, Astelin, Flexeril, Cartia, Cymbalta, Diflucan, Flovent, Lasix, Neurontin, Avapro, Xopenex, Tapazole, Myrbetriq, Mycostatin, Singulair, Protonix, Deltasone, Align, Zantac, Imitrex, Ultram, Xarelto, Vistaril   PROCEDURE:  This is a multichannel digital polysomnogram utilizing the SomnoStar 11.2 system.  Electrodes and sensors were applied and monitored per AASM Specifications.   EEG, EOG, Chin and Limb EMG, were sampled at 200 Hz.  ECG, Snore and Nasal Pressure, Thermal Airflow, Respiratory Effort, CPAP Flow and Pressure, Oximetry was sampled at 50 Hz. Digital video and audio were recorded.      CPAP was initiated at 5 cmH20 with heated humidity per AASM split night standards and pressure was advanced to 6 cmH20 because of hypopneas, apneas and desaturations.  At a PAP pressure of 6 cmH20, there was a reduction of the AHI to 0.6/h with improvement of sleep apnea, hypoxemia and snoring.  Lights Out was at 21:47 and Lights On at 04:59. Total recording time (TRT) was 432.5 minutes, with a total sleep time (TST) of 364 minutes. The patient's sleep latency was 38 minutes. REM latency was 0 minutes.  The sleep efficiency was 84.2 %.    SLEEP ARCHITECTURE: WASO (Wake after sleep onset) was 47.5 minutes.  There  were 8 minutes in Stage N1, 356 minutes Stage N2, 0 minutes Stage N3 and 0 minutes in Stage REM.  The percentage of Stage N1 was 2.2%, Stage N2 was 97.8%, Stage N3 was 0% and Stage R (REM sleep) was again 0%.   RESPIRATORY ANALYSIS:  There was a total of 9 respiratory events: 9 hypopneas with 0 respiratory event related arousals (RERAs).    The total APNEA/HYPOPNEA INDEX  (AHI) was 1.5 /hour and the total RESPIRATORY DISTURBANCE INDEX was 1.5 /hour  0 events occurred in REM sleep and 9 events in NREM. The REM AHI was 0 /hour versus a non-REM AHI of 1.5 /hour.  The patient spent 133 minutes of total sleep time in the supine position and 231 minutes in non-supine. The supine AHI was 3.2, versus a non-supine AHI of 0.5/h.  OXYGEN SATURATION & C02:  The baseline 02 saturation was 88%, with the lowest being 78%. Time spent below 89% saturation equaled 16 minutes.  PERIODIC LIMB MOVEMENTS:  The patient had a total of 37 Periodic Limb Movements. The Periodic Limb Movement (PLM) index was 6.1 and the PLM Arousal index was 4.5 /hour. The arousals were noted as: 92 were spontaneous, 27 were associated with PLMs, and 2 were associated with respiratory events.  Audio and video analysis did not show any abnormal or unusual movements, behaviors, phonations or vocalizations.  Sleep remained very fragmented. Frequent PLMs remained. The patient took one bathroom break. EKG with irregular heart rate, PVCs - skipped beats - see screen shot.  The patient was fitted with a ResMed AirFit P10, the technologist did not note the size.   DIAGNOSIS Mild Obstructive Sleep Apnea responded to CPAP at low pressure of 7 cm water, using a nasal pillow interface.  The patient was fitted with a ResMed AirFit P10, the technologist did not note the size.    1. Periodic Limb Movement Disorder was improved but not alleviated under CPAP and may require further investigation as to the reported hip problem. Dr. Gladstone Price. 2. Non-specific  abnormal EKG. Screenshot attached. Dr. Diona Price   PLANS/RECOMMENDATIONS: I will order CPAP with auto-titration function, 5 through 10 cm water, 3 cm EPR and either wisp interface or nasal pillow of patient's choice.  The patient may need to see pain specialist or orthopedist for the PLMD.    DISCUSSION: DME referral  / Patient needs to be educated about compliance with positive airway pressure therapy.  A follow up appointment will be scheduled in the Sleep Clinic at [facility].   Please call [phone] with any questions.      I certify that I have reviewed the entire raw data recording prior to the issuance of this report in accordance with the Standards of Accreditation of the American Academy of Sleep Medicine (AASM)    [name], M.D.      02-13-2018  Diplomat, American Board of Psychiatry and Neurology  Diplomat, [place] of Sleep Medicine Medical Director, [name] [facility]

## 2018-02-13 NOTE — Addendum Note (Signed)
Addended by: Larey Seat on: 02/13/2018 07:26 PM   Modules accepted: Orders

## 2018-02-14 DIAGNOSIS — M545 Low back pain: Secondary | ICD-10-CM | POA: Diagnosis not present

## 2018-02-16 ENCOUNTER — Other Ambulatory Visit: Payer: Self-pay | Admitting: Allergy and Immunology

## 2018-02-17 ENCOUNTER — Other Ambulatory Visit: Payer: Self-pay | Admitting: Orthopedic Surgery

## 2018-02-17 ENCOUNTER — Telehealth: Payer: Self-pay | Admitting: Neurology

## 2018-02-17 DIAGNOSIS — M545 Low back pain, unspecified: Secondary | ICD-10-CM

## 2018-02-17 DIAGNOSIS — G8929 Other chronic pain: Secondary | ICD-10-CM

## 2018-02-17 NOTE — Telephone Encounter (Signed)
-----   Message from Larey Seat, MD sent at 02/13/2018  7:26 PM EDT ----- DIAGNOSIS Mild Complex Sleep Apnea responded to CPAP at low pressure of  7 cm water, using a nasal pillow interface. The patient was  fitted with a ResMed AirFit P10, the technologist did not note  the size.   1. Periodic Limb Movement Disorder was improved but not  alleviated under CPAP and may require further investigation as to  the reported hip problem. Dr. Gladstone Lighter. 2. Non-specific abnormal EKG. Screenshot attached. Dr. Diona Browner   PLANS/RECOMMENDATIONS: I will order CPAP with auto-titration function, 5 through 10 cm water, 3 cm EPR and either wisp interface or nasal pillow of  patient's choice. The patient may need to see pain specialist or orthopedist for the PLMD.

## 2018-02-17 NOTE — Telephone Encounter (Signed)
I called pt. I advised pt that Dr. Brett Fairy reviewed their sleep study results and found that pt was able to tolerate and be treated with a CPAP at a pressure of 6 cm. Dr. Brett Fairy recommends that pt start a auto CPAP. I reviewed PAP compliance expectations with the pt. Pt is agreeable to starting a CPAP. I advised pt that an order will be sent to a DME, [facility], and [facility] will call the pt within about one week after they file with the pt's insurance. [facility] will show the pt how to use the machine, fit for masks, and troubleshoot the CPAP if needed. A follow up appt was made for insurance purposes with Dr. Brett Fairy on Oct 16,2019 at 8:30 am . Pt verbalized understanding to arrive 15 minutes early and bring their CPAP. A letter with all of this information in it will be mailed to the pt as a reminder. I verified with the pt that the address we have on file is correct. Pt verbalized understanding of results. Pt had no questions at this time but was encouraged to call back if questions arise.

## 2018-02-24 DIAGNOSIS — J4550 Severe persistent asthma, uncomplicated: Secondary | ICD-10-CM | POA: Diagnosis not present

## 2018-02-25 ENCOUNTER — Ambulatory Visit (INDEPENDENT_AMBULATORY_CARE_PROVIDER_SITE_OTHER): Payer: Medicare Other

## 2018-02-25 DIAGNOSIS — J4550 Severe persistent asthma, uncomplicated: Secondary | ICD-10-CM | POA: Diagnosis not present

## 2018-02-26 ENCOUNTER — Ambulatory Visit
Admission: RE | Admit: 2018-02-26 | Discharge: 2018-02-26 | Disposition: A | Discharge status: Home / Self Care | Payer: Medicare Other | Source: Ambulatory Visit | Attending: Orthopedic Surgery | Admitting: Orthopedic Surgery

## 2018-02-26 DIAGNOSIS — G8929 Other chronic pain: Secondary | ICD-10-CM

## 2018-02-26 DIAGNOSIS — M48061 Spinal stenosis, lumbar region without neurogenic claudication: Secondary | ICD-10-CM | POA: Diagnosis not present

## 2018-02-26 DIAGNOSIS — M545 Low back pain, unspecified: Secondary | ICD-10-CM

## 2018-02-27 ENCOUNTER — Other Ambulatory Visit: Payer: Self-pay | Admitting: Family Medicine

## 2018-02-27 NOTE — Telephone Encounter (Signed)
Name of Medication: acetaminophen-codeine 300-30 mg Name of Pharmacy: [facility] [address] at [employer] or Written Date and Quantity: # 30 on 12/24/17 Last Office Visit and Type: 12/24/17 with back pain Next Office Visit and Type: 04/01/18 CPX

## 2018-02-27 NOTE — Telephone Encounter (Signed)
Copied from [place] [id number]. Topic: Quick Communication - Rx Refill/Question >> Feb 27, 2018  3:52 PM Margot Ables wrote: Medication: acetaminophen-codeine (TYLENOL #3) 300-30 MG tablet - 1 pill left - pt having back pain Has the patient contacted their pharmacy? No - controlled Preferred Pharmacy (with phone number or street name): [id number] [address], [place] [phone] (Phone) [phone] (Fax)

## 2018-02-28 MED ORDER — ACETAMINOPHEN-CODEINE #3 300-30 MG PO TABS: 1.0000 | ORAL_TABLET | ORAL | 0 refills | Status: DC | PRN

## 2018-02-28 NOTE — Telephone Encounter (Signed)
Will fill to last until next appt but in future given it is a controlled substance we will need to give her 3 month supply at a time and follow protocol, UDS etc.... Will discuss at 3 month follow in 03/2018.

## 2018-03-04 DIAGNOSIS — M545 Low back pain: Secondary | ICD-10-CM | POA: Diagnosis not present

## 2018-03-04 DIAGNOSIS — L309 Dermatitis, unspecified: Secondary | ICD-10-CM | POA: Diagnosis not present

## 2018-03-07 ENCOUNTER — Telehealth: Payer: Self-pay | Admitting: *Deleted

## 2018-03-07 NOTE — Telephone Encounter (Signed)
Copied from [place] [phone]. Topic: General - Other >> Mar 07, 2018 12:29 PM Yvette Rack wrote: Reason for CRM: pt calling wanting to speak with someone one of her other providers Doctor Gladstone Lighter that see her for her knees and back wanting to know if she has had any urine test that show that she has kidney problems please call her back at [phone]

## 2018-03-07 NOTE — Telephone Encounter (Signed)
Ms. Harne notified as instructed by telephone.

## 2018-03-07 NOTE — Telephone Encounter (Signed)
Last Cr and GFR here .. nml in 09/2017

## 2018-03-18 DIAGNOSIS — N3941 Urge incontinence: Secondary | ICD-10-CM | POA: Diagnosis not present

## 2018-03-18 DIAGNOSIS — R351 Nocturia: Secondary | ICD-10-CM | POA: Diagnosis not present

## 2018-03-18 DIAGNOSIS — R350 Frequency of micturition: Secondary | ICD-10-CM | POA: Diagnosis not present

## 2018-03-20 DIAGNOSIS — M545 Low back pain: Secondary | ICD-10-CM | POA: Diagnosis not present

## 2018-03-21 ENCOUNTER — Telehealth: Payer: Self-pay | Admitting: *Deleted

## 2018-03-21 NOTE — Telephone Encounter (Signed)
Copied from [place] [phone]. Topic: General - Other >> Mar 21, 2018  9:55 AM Yvette Rack wrote: Reason for CRM: pt calling wanting Dr Arley Phenix assistant to fax over a copy of her medicines to her urologist Dr Nicki Reaper [facility] fax number [phone] phone number [phone]

## 2018-03-21 NOTE — Telephone Encounter (Addendum)
Medication list faxed to Dr. Matilde Sprang at [phone] as requested.

## 2018-03-23 ENCOUNTER — Telehealth: Payer: Self-pay | Admitting: Family Medicine

## 2018-03-23 DIAGNOSIS — E0840 Diabetes mellitus due to underlying condition with diabetic neuropathy, unspecified: Secondary | ICD-10-CM

## 2018-03-23 DIAGNOSIS — E559 Vitamin D deficiency, unspecified: Secondary | ICD-10-CM

## 2018-03-23 NOTE — Telephone Encounter (Signed)
-----   Message from Eustace Pen, LPN sent at 02/16/7206  4:23 PM EDT ----- Regarding: Labs 9/13 Lab orders needed. Thank you.  Insurance:  [employer]

## 2018-03-24 DIAGNOSIS — J4550 Severe persistent asthma, uncomplicated: Secondary | ICD-10-CM

## 2018-03-25 ENCOUNTER — Telehealth: Payer: Self-pay | Admitting: Allergy and Immunology

## 2018-03-25 ENCOUNTER — Ambulatory Visit (INDEPENDENT_AMBULATORY_CARE_PROVIDER_SITE_OTHER): Payer: Medicare Other | Admitting: *Deleted

## 2018-03-25 ENCOUNTER — Other Ambulatory Visit: Payer: Self-pay | Admitting: Allergy and Immunology

## 2018-03-25 DIAGNOSIS — J4550 Severe persistent asthma, uncomplicated: Secondary | ICD-10-CM | POA: Diagnosis not present

## 2018-03-25 DIAGNOSIS — M545 Low back pain: Secondary | ICD-10-CM | POA: Diagnosis not present

## 2018-03-25 DIAGNOSIS — M542 Cervicalgia: Secondary | ICD-10-CM | POA: Diagnosis not present

## 2018-03-25 MED ORDER — ALBUTEROL SULFATE HFA 108 (90 BASE) MCG/ACT IN AERS: 2.0000 | INHALATION_SPRAY | Freq: Four times a day (QID) | RESPIRATORY_TRACT | 0 refills | Status: DC | PRN

## 2018-03-25 NOTE — Telephone Encounter (Signed)
Patient will need to schedule an appt for further evaluation on patient sx. Proventil was sent to pharmacy. No further refills needed at this time.

## 2018-03-25 NOTE — Telephone Encounter (Signed)
Pt called and said that she needs a inhaler Proventil called into [facility] [place] [phone]./

## 2018-03-26 ENCOUNTER — Telehealth: Payer: Self-pay

## 2018-03-26 ENCOUNTER — Other Ambulatory Visit: Payer: Self-pay | Admitting: Orthopedic Surgery

## 2018-03-26 DIAGNOSIS — G8929 Other chronic pain: Secondary | ICD-10-CM

## 2018-03-26 DIAGNOSIS — M545 Low back pain: Principal | ICD-10-CM

## 2018-03-26 NOTE — Telephone Encounter (Signed)
   [facility] Pre-operative Risk Assessment    Request for surgical clearance:  1. What type of surgery is being performed? Lumbar Myelogram  2. When is this surgery scheduled? TBD   3. What type of clearance is required (medical clearance vs. Pharmacy clearance to hold med vs. Both)? Pharmacy  4. Are there any medications that need to be held prior to surgery and how long? Xarelto, 1 day     5. Practice name and name of physician performing surgery? [facility]   6. What is your office phone number [phone]    7.   What is your office fax number [phone]  8.   Anesthesia type (None, local, MAC, general) ? Not listed   Jacqulynn Cadet 03/26/2018, 4:54 PM  _________________________________________________________________   (provider comments below)

## 2018-03-27 ENCOUNTER — Ambulatory Visit: Payer: Medicare Other

## 2018-03-28 ENCOUNTER — Ambulatory Visit (INDEPENDENT_AMBULATORY_CARE_PROVIDER_SITE_OTHER): Payer: Medicare Other

## 2018-03-28 ENCOUNTER — Telehealth: Payer: Self-pay

## 2018-03-28 ENCOUNTER — Ambulatory Visit (INDEPENDENT_AMBULATORY_CARE_PROVIDER_SITE_OTHER): Payer: Medicare Other | Admitting: Nurse Practitioner

## 2018-03-28 ENCOUNTER — Ambulatory Visit: Payer: Medicare Other | Admitting: Nurse Practitioner

## 2018-03-28 ENCOUNTER — Encounter: Payer: Self-pay | Admitting: Nurse Practitioner

## 2018-03-28 VITALS — BP 108/70 | HR 79 | Temp 98.6°F | Ht 65.0 in | Wt 221.0 lb

## 2018-03-28 VITALS — BP 108/70 | HR 64 | Temp 98.5°F | Ht 65.0 in | Wt 221.5 lb

## 2018-03-28 DIAGNOSIS — E0840 Diabetes mellitus due to underlying condition with diabetic neuropathy, unspecified: Secondary | ICD-10-CM

## 2018-03-28 DIAGNOSIS — Z0000 Encounter for general adult medical examination without abnormal findings: Secondary | ICD-10-CM | POA: Diagnosis not present

## 2018-03-28 DIAGNOSIS — R079 Chest pain, unspecified: Secondary | ICD-10-CM | POA: Diagnosis not present

## 2018-03-28 DIAGNOSIS — R0781 Pleurodynia: Secondary | ICD-10-CM | POA: Diagnosis not present

## 2018-03-28 DIAGNOSIS — E559 Vitamin D deficiency, unspecified: Secondary | ICD-10-CM | POA: Diagnosis not present

## 2018-03-28 DIAGNOSIS — R0789 Other chest pain: Secondary | ICD-10-CM

## 2018-03-28 LAB — COMPREHENSIVE METABOLIC PANEL
ALT: 12 U/L (ref 0–35)
AST: 13 U/L (ref 0–37)
Albumin: 3.9 g/dL (ref 3.5–5.2)
Alkaline Phosphatase: 82 U/L (ref 39–117)
BUN: 13 mg/dL (ref 6–23)
CO2: 25 mEq/L (ref 19–32)
Calcium: 9.3 mg/dL (ref 8.4–10.5)
Chloride: 103 mEq/L (ref 96–112)
Creatinine, Ser: 1.12 mg/dL (ref 0.40–1.20)
GFR: 61.56 mL/min (ref >60.00)
Glucose, Bld: 127 mg/dL — ABNORMAL HIGH (ref 70–99)
Potassium: 4.5 mEq/L (ref 3.5–5.1)
Sodium: 139 mEq/L (ref 135–145)
Total Bilirubin: 0.7 mg/dL (ref 0.2–1.2)
Total Protein: 7.1 g/dL (ref 6.0–8.3)

## 2018-03-28 LAB — LIPID PANEL
Cholesterol: 154 mg/dL (ref 0–200)
HDL: 52.9 mg/dL (ref >39.00)
LDL Cholesterol: 82 mg/dL (ref 0–99)
NonHDL: 100.65
Total CHOL/HDL Ratio: 3
Triglycerides: 94 mg/dL (ref 0.0–149.0)
VLDL: 18.8 mg/dL (ref 0.0–40.0)

## 2018-03-28 LAB — VITAMIN D 25 HYDROXY (VIT D DEFICIENCY, FRACTURES): VITD: 31.13 ng/mL (ref 30.00–100.00)

## 2018-03-28 LAB — HEMOGLOBIN A1C: Hgb A1c MFr Bld: 6.3 % (ref 4.6–6.5)

## 2018-03-28 MED ORDER — ACETAMINOPHEN-CODEINE #3 300-30 MG PO TABS: 1.0000 | ORAL_TABLET | Freq: Three times a day (TID) | ORAL | 0 refills | Status: AC | PRN

## 2018-03-28 NOTE — Progress Notes (Addendum)
Subjective:  Patient ID: [name], female    DOB: 10-23-1945  Age: 72 y.o. MRN: [id number]  CC: Back Pain (patient is complaining of lower back pain,spine area,near sternum. this has been going on 3 wks. burping alot today. )  Chest Pain   This is a new problem. The current episode started in the past 7 days. The onset quality is gradual. The problem occurs constantly. The problem has been unchanged. The pain is present in the substernal region. The quality of the pain is described as dull and pressure. The pain does not radiate. Associated symptoms include back pain. Pertinent negatives include no claudication, cough, diaphoresis, dizziness, exertional chest pressure, fever, nausea, numbness, orthopnea, shortness of breath or sputum production. The pain is aggravated by lifting arm, movement and walking. She has tried acetaminophen for the symptoms. The treatment provided mild relief. Risk factors include sedentary lifestyle, post-menopausal, lack of exercise and being elderly.  Her past medical history is significant for COPD, diabetes, DVT, hyperlipidemia, hypertension, sleep apnea and valve disorder.  hx of GERD: use of protonix and ranitidine. No melena, no hematochezia, no ABD pain.  Lower back pain: chronic, no change, associated with muscle spasms  Reviewed past Medical, Social and Family history today.  Outpatient Medications Prior to Visit  Medication Sig Dispense Refill  . ADVAIR HFA 230-21 MCG/ACT inhaler INHALE 2 PUFFS BY MOUTH TWICE DAILY 12 g 3  . albuterol (PROVENTIL HFA;VENTOLIN HFA) 108 (90 Base) MCG/ACT inhaler INHALE 2 PUFFS BY MOUTH INTO THE LUNGS EVERY 6 HOURS AS NEEDED FOR WHEEZING OR SHORTNESS OF BREATH 54 g 0  . APPLE CIDER VINEGAR PO Take 2 tablets by mouth 2 (two) times daily.    Marland Kitchen azelastine (ASTELIN) 0.1 % nasal spray USE 1-2 SPRAYS IN EACH NOSTRIL TWICE DAILY AS NEEDED 30 mL 5  . cyclobenzaprine (FLEXERIL) 10 MG tablet Take 1 tablet (10 mg total) by mouth 2  (two) times daily as needed for muscle spasms. 15 tablet 0  . diclofenac (VOLTAREN) 75 MG EC tablet Take 1 tablet (75 mg total) by mouth 2 (two) times daily. 30 tablet 0  . diltiazem (CARTIA XT) 120 MG 24 hr capsule Take 1 capsule (120 mg total) by mouth daily. KEEP OV. 30 capsule 2  . DULoxetine (CYMBALTA) 30 MG capsule Take 1 capsule (30 mg total) by mouth daily. 30 capsule 5  . Empagliflozin-Linagliptin (GLYXAMBI) 10-5 MG TABS Take 1 tablet by mouth daily.    Marland Kitchen EPINEPHrine 0.3 mg/0.3 mL IJ SOAJ injection USE AS DIRECTED FOR LIFE THREATENING ALLERGIC REACTIONS 2 Device 2  . fesoterodine (TOVIAZ) 8 MG TB24 tablet Take 8 mg by mouth daily.    . fluticasone (FLOVENT HFA) 220 MCG/ACT inhaler Inhale 2 puffs into the lungs 2 (two) times daily as needed. 1 Inhaler 5  . furosemide (LASIX) 40 MG tablet TAKE 1 TABLET(40 MG) BY MOUTH DAILY 30 tablet 3  . gabapentin (NEURONTIN) 300 MG capsule Take 600 mg by mouth 3 (three) times daily.  6  . hydrOXYzine (ATARAX/VISTARIL) 10 MG tablet TK 1 TO 2 TS PO HS  1  . irbesartan (AVAPRO) 150 MG tablet TAKE 1 TABLET(150 MG) BY MOUTH DAILY 30 tablet 9  . Lactase (DAIRY-RELIEF PO) Take 1 capsule by mouth daily as needed (stomach upset).     Marland Kitchen levalbuterol (XOPENEX) 1.25 MG/3ML nebulizer solution USE 1 VIAL VIA NEBULIZER EVERY 4 HOURS AS NEEDED FOR WHEEZING 150 mL 0  . Magnesium 250 MG TABS Take 1 tablet  by mouth daily.    . methimazole (TAPAZOLE) 10 MG tablet Take 10 mg in a.m. and 5 mg in p.m. by mouth, with meals 60 tablet 3  . mirabegron ER (MYRBETRIQ) 50 MG TB24 tablet Take 50 mg by mouth daily.    . montelukast (SINGULAIR) 10 MG tablet TAKE 1 TABLET BY MOUTH EVERY DAY 30 tablet 5  . ONETOUCH DELICA LANCETS 24M MISC as needed (blood check).     [name] VERIO test strip 1 each by Other route as needed (blood monitoring).     . pantoprazole (PROTONIX) 40 MG tablet TAKE 1 TABLET BY MOUTH EVERY DAY 90 tablet 1  . PATADAY 0.2 % SOLN INT 1 GTT IN OU QD PRN  4  .  polyethylene glycol (MIRALAX / GLYCOLAX) packet Take 17 g by mouth daily.    . Probiotic Product (ALIGN) 4 MG CAPS Take 1 capsule by mouth daily.    . ranitidine (ZANTAC) 300 MG tablet TAKE 1 TABLET BY MOUTH EVERY NIGHT AT BEDTIME 90 tablet 0  . rivaroxaban (XARELTO) 20 MG TABS tablet Take 1 tablet (20 mg total) by mouth daily with supper. 90 tablet 1  . SPIRIVA RESPIMAT 1.25 MCG/ACT AERS INHALE 2 PUFFS BY MOUTH EVERY DAY 4 g 3  . SUMAtriptan (IMITREX) 100 MG tablet Take 100 mg x 1 may, repeat in 2 hour for migraine. 10 tablet 0  . tolterodine (DETROL LA) 4 MG 24 hr capsule Take 4 mg by mouth daily.    . traMADol (ULTRAM) 50 MG tablet Take 1 tablet (50 mg total) by mouth every 8 (eight) hours as needed. 15 tablet 0  . acetaminophen-codeine (TYLENOL #3) 300-30 MG tablet Take 1-2 tablets by mouth every 4 (four) hours as needed for moderate pain. 30 tablet 0   Facility-Administered Medications Prior to Visit  Medication Dose Route Frequency Provider Last Rate Last Dose  . omalizumab Arvid Right) injection 300 mg  300 mg Subcutaneous Q28 days [name], MD   300 mg at 03/25/18 1101    ROS See HPI  Objective:  BP 108/70   Pulse 79   Temp 98.6 F (37 C) (Oral)   Ht 5\' 5"  (1.651 m)   Wt 221 lb (100.2 kg)   SpO2 98%   BMI 36.78 kg/m   BP Readings from Last 3 Encounters:  03/28/18 108/70  03/28/18 108/70  02/06/18 130/88   Wt Readings from Last 3 Encounters:  03/28/18 221 lb (100.2 kg)  03/28/18 221 lb 8 oz (100.5 kg)  02/06/18 222 lb 6.4 oz (100.9 kg)    ECG: NSR with PACs, no ST segment or Twave abnormality. Compared to previous tracing 04/15/2017.  Physical Exam  Constitutional: She is oriented to person, place, and time.  Neck: No JVD present.  Cardiovascular: Normal rate, regular rhythm and normal heart sounds.  Pulmonary/Chest: Effort normal and breath sounds normal. She exhibits tenderness. She exhibits no mass, no edema and no swelling.  Abdominal: Soft. Bowel sounds are  normal. There is no tenderness.  Musculoskeletal: She exhibits no edema.  Neurological: She is alert and oriented to person, place, and time.  Skin: Skin is warm and dry.  Vitals reviewed.  Lab Results  Component Value Date   WBC 9.0 09/27/2017   HGB 14.5 09/27/2017   HCT 44.4 09/27/2017   PLT 337.0 09/27/2017   GLUCOSE 136 (H) 09/27/2017   CHOL 108 09/27/2017   TRIG 46.0 09/27/2017   HDL 51.60 09/27/2017   LDLCALC 47 09/27/2017  ALT 19 09/27/2017   AST 18 09/27/2017   NA 140 09/27/2017   K 4.7 09/27/2017   CL 105 09/27/2017   CREATININE 0.89 09/27/2017   BUN 12 09/27/2017   CO2 29 09/27/2017   TSH <0.01 (L) 02/06/2018   INR 1.17 12/28/2010   HGBA1C 5.9 03/25/2017    Mr Lumbar Spine Wo Contrast  Result Date: 02/26/2018 CLINICAL DATA:  Back hip pain on the right side EXAM: MRI LUMBAR SPINE WITHOUT CONTRAST TECHNIQUE: Multiplanar, multisequence MR imaging of the lumbar spine was performed. No intravenous contrast was administered. COMPARISON:  None. FINDINGS: Segmentation:  Standard. Alignment:  Physiologic. Vertebrae:  No fracture, evidence of discitis, or bone lesion. Conus medullaris and cauda equina: Conus extends to the L1 level. Conus and cauda equina appear normal. Paraspinal and other soft tissues: Negative. Disc levels: Disc desiccation at L4-5 degenerative disease disc height loss at T11-12 T11-12: Mild broad-based disc bulge. No foraminal or central canal stenosis. T12-L1: No significant disc bulge. No evidence of neural foraminal stenosis. No central canal stenosis. L1-L2: Mild broad-based disc bulge. Mild bilateral facet arthropathy. No evidence of neural foraminal stenosis. No central canal stenosis. L2-L3: Mild broad-based disc bulge. Mild bilateral facet arthropathy. No evidence of neural foraminal stenosis. No central canal stenosis. L3-L4: Broad-based disc bulge. Moderate bilateral facet arthropathy. No evidence of neural foraminal stenosis. No central canal stenosis.  L4-L5: Broad-based disc bulge with a left paracentral disc protrusion. Severe bilateral facet arthropathy. Moderate-severe spinal stenosis. No foraminal stenosis. L5-S1: Mild broad-based disc bulge. Moderate bilateral facet arthropathy. No evidence of neural foraminal stenosis. No central canal stenosis. IMPRESSION: 1. At L4-5 there is a broad-based disc bulge with a left paracentral disc protrusion. Severe bilateral facet arthropathy. Moderate-severe spinal stenosis. 2. Lumbar spine spondylosis as described above. Electronically Signed   By: Kathreen Devoid   On: 02/26/2018 17:12    Assessment & Plan:   Benita was seen today for back pain.  Diagnoses and all orders for this visit:  Atypical chest pain -     DG Chest 2 View -     EKG 12-Lead -     acetaminophen-codeine (TYLENOL #3) 300-30 MG tablet; Take 1 tablet by mouth every 8 (eight) hours as needed for up to 3 days for moderate pain.  Anterior pleuritic pain -     acetaminophen-codeine (TYLENOL #3) 300-30 MG tablet; Take 1 tablet by mouth every 8 (eight) hours as needed for up to 3 days for moderate pain.   I have changed [facility]. Boody's acetaminophen-codeine. I am also having her maintain her Lactase (DAIRY-RELIEF PO), Magnesium, ONETOUCH DELICA LANCETS 75Z, ONETOUCH VERIO, gabapentin, GLYXAMBI, diltiazem, fluticasone, SUMAtriptan, cyclobenzaprine, PATADAY, furosemide, polyethylene glycol, ALIGN, azelastine, irbesartan, SPIRIVA RESPIMAT, ADVAIR HFA, mirabegron ER, hydrOXYzine, traMADol, montelukast, DULoxetine, APPLE CIDER VINEGAR PO, diclofenac, EPINEPHrine, levalbuterol, rivaroxaban, ranitidine, methimazole, pantoprazole, albuterol, fesoterodine, and tolterodine. We will continue to administer omalizumab.  Meds ordered this encounter  Medications  . acetaminophen-codeine (TYLENOL #3) 300-30 MG tablet    Sig: Take 1 tablet by mouth every 8 (eight) hours as needed for up to 3 days for moderate pain.    Dispense:  9 tablet    Refill:  0     Order Specific Question:   Supervising Provider    Answer:   MATTHEWS, CODY [[id number]]    Follow-up: No follow-ups on file.  Wilfred Lacy, NP

## 2018-03-28 NOTE — Patient Instructions (Addendum)
[name] , Thank you for taking time to come for your Medicare Wellness Visit. I appreciate your ongoing commitment to your health goals. Please review the following plan we discussed and let me know if I can assist you in the future.   These are the goals we discussed: Goals    . Increase water intake     Starting 03/28/2018, I will continue to drink at least 6-8 glasses of water daily.        This is a list of the screening recommended for you and due dates:  Health Maintenance  Topic Date Due  . Flu Shot  10/15/2018*  . Complete foot exam   04/04/2018  . Hemoglobin A1C  09/26/2018  . Mammogram  10/02/2018  . Eye exam for diabetics  12/28/2018  . Colon Cancer Screening  10/10/2022  . Tetanus Vaccine  10/20/2023  . DEXA scan (bone density measurement)  Completed  .  Hepatitis C: One time screening is recommended by Center for Disease Control  (CDC) for  adults born from [birth year] through 1964.   Completed  . Pneumonia vaccines  Completed  *Topic was postponed. The date shown is not the original due date.   Preventive Care for Adults  A healthy lifestyle and preventive care can promote health and wellness. Preventive health guidelines for adults include the following key practices.  . A routine yearly physical is a good way to check with your health care provider about your health and preventive screening. It is a chance to share any concerns and updates on your health and to receive a thorough exam.  . Visit your dentist for a routine exam and preventive care every 6 months. Brush your teeth twice a day and floss once a day. Good oral hygiene prevents tooth decay and gum disease.  . The frequency of eye exams is based on your age, health, family medical history, use  of contact lenses, and other factors. Follow your health care provider's recommendations for frequency of eye exams.  . Eat a healthy diet. Foods like vegetables, fruits, whole grains, low-fat dairy products, and lean  protein foods contain the nutrients you need without too many calories. Decrease your intake of foods high in solid fats, added sugars, and salt. Eat the right amount of calories for you. Get information about a proper diet from your health care provider, if necessary.  . Regular physical exercise is one of the most important things you can do for your health. Most adults should get at least 150 minutes of moderate-intensity exercise (any activity that increases your heart rate and causes you to sweat) each week. In addition, most adults need muscle-strengthening exercises on 2 or more days a week.  Silver Sneakers may be a benefit available to you. To determine eligibility, you may visit the website: www.silversneakers.com or contact program at 5745429613 Mon-Fri between 8AM-8PM.   . Maintain a healthy weight. The body mass index (BMI) is a screening tool to identify possible weight problems. It provides an estimate of body fat based on height and weight. Your health care provider can find your BMI and can help you achieve or maintain a healthy weight.   For adults 20 years and older: ? A BMI below 18.5 is considered underweight. ? A BMI of 18.5 to 24.9 is normal. ? A BMI of 25 to 29.9 is considered overweight. ? A BMI of 30 and above is considered obese.   . Maintain normal blood lipids and cholesterol levels by  exercising and minimizing your intake of saturated fat. Eat a balanced diet with plenty of fruit and vegetables. Blood tests for lipids and cholesterol should begin at age 68 and be repeated every 5 years. If your lipid or cholesterol levels are high, you are over 50, or you are at high risk for heart disease, you may need your cholesterol levels checked more frequently. Ongoing high lipid and cholesterol levels should be treated with medicines if diet and exercise are not working.  . If you smoke, find out from your health care provider how to quit. If you do not use tobacco, please  do not start.  . If you choose to drink alcohol, please do not consume more than 2 drinks per day. One drink is considered to be 12 ounces (355 mL) of beer, 5 ounces (148 mL) of wine, or 1.5 ounces (44 mL) of liquor.  . If you are 68-28 years old, ask your health care provider if you should take aspirin to prevent strokes.  . Use sunscreen. Apply sunscreen liberally and repeatedly throughout the day. You should seek shade when your shadow is shorter than you. Protect yourself by wearing long sleeves, pants, a wide-brimmed hat, and sunglasses year round, whenever you are outdoors.  . Once a month, do a whole body skin exam, using a mirror to look at the skin on your back. Tell your health care provider of new moles, moles that have irregular borders, moles that are larger than a pencil eraser, or moles that have changed in shape or color.

## 2018-03-28 NOTE — Telephone Encounter (Signed)
   [facility] Pre-operative Risk Assessment    Request for surgical clearance:  1. What type of surgery is being performed? Lumbar Myelogram  2. When is this surgery scheduled? TBD  3. What type of clearance is required (medical clearance vs. Pharmacy clearance to hold med vs. Both)? Pharmacy  4. Are there any medications that need to be held prior to surgery and how long? Xarelto hold 1 day prior  5. Practice name and name of physician performing surgery? [facility]  6. What is your office phone number [phone]   7.   What is your office fax number [phone]  8.   Anesthesia type (None, local, MAC, general) ? Not listed   [name] 03/28/2018, 9:52 AM  _________________________________________________________________   (provider comments below)

## 2018-03-28 NOTE — Telephone Encounter (Signed)
Patient with diagnosis of afib with PE/DVT with noted hypercoagulable state on Xarelto for anticoagulation.    Procedure: lumbar myelogram Date of procedure: TBD  CHADS2-VASc score of  4 (CHF, HTN, AGE, DM2, stroke/tia x 2, CAD, AGE, female)  CrCl 66ml/min  Procedure physician ok with 1 day hold. Generally we hold NOACs for 3 days prior to spinal injection, with history of hypercoagulable state will route to Dr. Sallyanne Kuster for input on length of hold.

## 2018-03-28 NOTE — Telephone Encounter (Signed)
I think best to hold for 3 days as we usually do. MCr

## 2018-03-28 NOTE — Patient Instructions (Addendum)
No acute finding. Use tylenol-codeine as discussed. Go to hospital if symptoms worsen or do not improve.

## 2018-03-28 NOTE — Telephone Encounter (Signed)
Duplicate encounter see phone note from 03/26/18 for details.

## 2018-03-29 ENCOUNTER — Other Ambulatory Visit: Payer: Self-pay | Admitting: Neurology

## 2018-03-30 NOTE — Progress Notes (Signed)
PCP notes:   Health maintenance:  Flu vaccine - addressed A1C - completed  Abnormal screenings:   Hearing - failed  Hearing Screening   125Hz  250Hz  500Hz  1000Hz  2000Hz  3000Hz  4000Hz  6000Hz  8000Hz   Right ear:   40 40 40  40    Left ear:   40 40 40  0     Patient concerns:   Patient reports concerns with back and chest pain. Recently saw ortho specialist on 03/25/18. Advised PCP. Patient scheduled same day appt with another provider.   Nurse concerns:  None  Next PCP appt:   04/01/18@ 1115

## 2018-03-30 NOTE — Progress Notes (Signed)
Subjective:   ALAIAH LUNDY is a 72 y.o. female who presents for Medicare Annual (Subsequent) preventive examination.  Review of Systems:  N/A Cardiac Risk Factors include: advanced age (>64men, >45 women);obesity (BMI >30kg/m2);diabetes mellitus;hypertension     Objective:     Vitals: BP 108/70 (BP Location: Left Arm, Patient Position: Sitting, Cuff Size: Normal)   Pulse 64   Temp 98.5 F (36.9 C) (Oral)   Ht 5\' 5"  (1.651 m) Comment: shoes  Wt 221 lb 8 oz (100.5 kg)   SpO2 95%   BMI 36.86 kg/m   Body mass index is 36.86 kg/m.  Advanced Directives 03/28/2018 03/25/2017 11/21/2016 03/21/2016 06/08/2015 04/14/2015 05/19/2014  Does Patient Have a Medical Advance Directive? No No Yes No No No No  Type of Advance Directive - - Living will - - - -  Would patient like information on creating a medical advance directive? No - Patient declined - - Yes - Scientist, clinical (histocompatibility and immunogenetics) given Yes - Scientist, clinical (histocompatibility and immunogenetics) given No - patient declined information No - patient declined information    Tobacco Social History   Tobacco Use  Smoking Status Never Smoker  Smokeless Tobacco Never Used     Counseling given: No   Clinical Intake:  Pre-visit preparation completed: Yes  Pain Score: 3      Nutritional Status: BMI > 30  Obese Nutritional Risks: None Diabetes: Yes CBG done?: No Did pt. bring in CBG monitor from home?: No  How often do you need to have someone help you when you read instructions, pamphlets, or other written materials from your doctor or pharmacy?: 1 - Never What is the last grade level you completed in school?: 12th grade + 4 yrs college  Interpreter Needed?: No  Comments: pt lives alone Information entered by :: LPinson, LPN  Past Medical History:  Diagnosis Date  . Allergic rhinitis   . Allergy   . Arthritis   . Asthma   . Chronic headache   . Diabetes mellitus   . DVT (deep venous thrombosis) ([place])   . Dyspnea   . Heart murmur   . Hypertension   .  Pulmonary embolism ([place])   . Pulmonary hypertension ([place])   . Seizures ([place])    Past Surgical History:  Procedure Laterality Date  . ABDOMINAL HYSTERECTOMY     partial, has ovaries  . CARDIAC CATHETERIZATION  12/28/2010   Mod. pulmonary hypertension, normal coronary arteries  . DOPPLER ECHOCARDIOGRAPHY  10/08/2011   EF=>55%,mild asymmetric LVH, mod. TR, mod. PH, mild to mod LA dilatation  . KNEE ARTHROSCOPY Left   . KNEE SURGERY    . Nuclear Stress Test  05/20/2006   No ischemia  . PARTIAL HYSTERECTOMY    . PLANTAR FASCIA SURGERY    . TONSILLECTOMY     Family History  Problem Relation Age of Onset  . Hypertension Mother   . Clotting disorder Mother   . Breast cancer Mother   . Arthritis Mother   . Stroke Mother   . Diabetes Mother   . Cancer Brother   . Alcohol abuse Father   . Arthritis Sister   . Diabetes Sister   . Multiple sclerosis Sister   . Allergies Other        grandson   Social History   Socioeconomic History  . Marital status: Widowed    Spouse name: Not on file  . Number of children: Y  . Years of education: Not on file  . Highest education level: Not on  file  Occupational History  . Occupation: retired Geologist, engineering.   Social Needs  . Financial resource strain: Not on file  . Food insecurity:    Worry: Not on file    Inability: Not on file  . Transportation needs:    Medical: Not on file    Non-medical: Not on file  Tobacco Use  . Smoking status: Never Smoker  . Smokeless tobacco: Never Used  Substance and Sexual Activity  . Alcohol use: Yes    Alcohol/week: 0.0 standard drinks    Comment: occ glass on wine  . Drug use: No  . Sexual activity: Never  Lifestyle  . Physical activity:    Days per week: Not on file    Minutes per session: Not on file  . Stress: Not on file  Relationships  . Social connections:    Talks on phone: Not on file    Gets together: Not on file    Attends religious service: Not on file    Active member of club or  organization: Not on file    Attends meetings of clubs or organizations: Not on file    Relationship status: Not on file  Other Topics Concern  . Not on file  Social History Narrative   Widow    limited exercise.    Outpatient Encounter Medications as of 03/28/2018  Medication Sig  . ADVAIR HFA 230-21 MCG/ACT inhaler INHALE 2 PUFFS BY MOUTH TWICE DAILY  . albuterol (PROVENTIL HFA;VENTOLIN HFA) 108 (90 Base) MCG/ACT inhaler INHALE 2 PUFFS BY MOUTH INTO THE LUNGS EVERY 6 HOURS AS NEEDED FOR WHEEZING OR SHORTNESS OF BREATH  . APPLE CIDER VINEGAR PO Take 2 tablets by mouth 2 (two) times daily.  Marland Kitchen azelastine (ASTELIN) 0.1 % nasal spray USE 1-2 SPRAYS IN EACH NOSTRIL TWICE DAILY AS NEEDED  . cyclobenzaprine (FLEXERIL) 10 MG tablet Take 1 tablet (10 mg total) by mouth 2 (two) times daily as needed for muscle spasms.  Marland Kitchen diltiazem (CARTIA XT) 120 MG 24 hr capsule Take 1 capsule (120 mg total) by mouth daily. KEEP OV.  . DULoxetine (CYMBALTA) 30 MG capsule Take 1 capsule (30 mg total) by mouth daily.  . Empagliflozin-Linagliptin (GLYXAMBI) 10-5 MG TABS Take 1 tablet by mouth daily.  Marland Kitchen EPINEPHrine 0.3 mg/0.3 mL IJ SOAJ injection USE AS DIRECTED FOR LIFE THREATENING ALLERGIC REACTIONS  . fesoterodine (TOVIAZ) 8 MG TB24 tablet Take 8 mg by mouth daily.  . fluticasone (FLOVENT HFA) 220 MCG/ACT inhaler Inhale 2 puffs into the lungs 2 (two) times daily as needed.  . furosemide (LASIX) 40 MG tablet TAKE 1 TABLET(40 MG) BY MOUTH DAILY  . gabapentin (NEURONTIN) 300 MG capsule Take 600 mg by mouth 3 (three) times daily.  . hydrOXYzine (ATARAX/VISTARIL) 10 MG tablet TK 1 TO 2 TS PO HS  . irbesartan (AVAPRO) 150 MG tablet TAKE 1 TABLET(150 MG) BY MOUTH DAILY  . Lactase (DAIRY-RELIEF PO) Take 1 capsule by mouth daily as needed (stomach upset).   Marland Kitchen levalbuterol (XOPENEX) 1.25 MG/3ML nebulizer solution USE 1 VIAL VIA NEBULIZER EVERY 4 HOURS AS NEEDED FOR WHEEZING  . Magnesium 250 MG TABS Take 1 tablet by mouth  daily.  . methimazole (TAPAZOLE) 10 MG tablet Take 10 mg in a.m. and 5 mg in p.m. by mouth, with meals  . mirabegron ER (MYRBETRIQ) 50 MG TB24 tablet Take 50 mg by mouth daily.  . montelukast (SINGULAIR) 10 MG tablet TAKE 1 TABLET BY MOUTH EVERY DAY  . ONETOUCH DELICA LANCETS  33G MISC as needed (blood check).   [name] VERIO test strip 1 each by Other route as needed (blood monitoring).   . pantoprazole (PROTONIX) 40 MG tablet TAKE 1 TABLET BY MOUTH EVERY DAY  . PATADAY 0.2 % SOLN INT 1 GTT IN OU QD PRN  . polyethylene glycol (MIRALAX / GLYCOLAX) packet Take 17 g by mouth daily.  . Probiotic Product (ALIGN) 4 MG CAPS Take 1 capsule by mouth daily.  . ranitidine (ZANTAC) 300 MG tablet TAKE 1 TABLET BY MOUTH EVERY NIGHT AT BEDTIME  . rivaroxaban (XARELTO) 20 MG TABS tablet Take 1 tablet (20 mg total) by mouth daily with supper.  Marland Kitchen SPIRIVA RESPIMAT 1.25 MCG/ACT AERS INHALE 2 PUFFS BY MOUTH EVERY DAY  . SUMAtriptan (IMITREX) 100 MG tablet Take 100 mg x 1 may, repeat in 2 hour for migraine.  . tolterodine (DETROL LA) 4 MG 24 hr capsule Take 4 mg by mouth daily.  . [DISCONTINUED] acetaminophen-codeine (TYLENOL #3) 300-30 MG tablet Take 1-2 tablets by mouth every 4 (four) hours as needed for moderate pain.  . [DISCONTINUED] diclofenac (VOLTAREN) 75 MG EC tablet Take 1 tablet (75 mg total) by mouth 2 (two) times daily.  . [DISCONTINUED] traMADol (ULTRAM) 50 MG tablet Take 1 tablet (50 mg total) by mouth every 8 (eight) hours as needed.   Facility-Administered Encounter Medications as of 03/28/2018  Medication  . omalizumab Arvid Right) injection 300 mg    Activities of Daily Living In your present state of health, do you have any difficulty performing the following activities: 03/28/2018  Hearing? N  Vision? N  Difficulty concentrating or making decisions? N  Walking or climbing stairs? N  Dressing or bathing? N  Doing errands, shopping? N  Preparing Food and eating ? N  Using the Toilet? N    In the past six months, have you accidently leaked urine? N  Do you have problems with loss of bowel control? N  Managing your Medications? N  Managing your Finances? N  Housekeeping or managing your Housekeeping? N  Some recent data might be hidden    Patient Care Team: [name], MD as PCP - General (Family Medicine) Croitoru, Dani Gobble, MD as PCP - Cardiology (Cardiology) Hortencia Pilar, MD as Consulting Physician (Ophthalmology) Neldon Mc, Donnamarie Poag, MD as Consulting Physician (Allergy and Immunology) Anda Kraft, MD as Consulting Physician (Endocrinology) Inocencio Homes, DPM as Consulting Physician (Podiatry) Croitoru, Dani Gobble, MD as Consulting Physician (Cardiology) Druscilla Brownie, MD as Consulting Physician (Dermatology) Latanya Maudlin, MD as Consulting Physician (Orthopedic Surgery) Boyd Kerbs, MD as Referring Physician (Specialist) Dionisio David, [place] as Referring Physician (Chiropractic Medicine)    Assessment:   This is a routine wellness examination for [place].   Hearing Screening   125Hz  250Hz  500Hz  1000Hz  2000Hz  3000Hz  4000Hz  6000Hz  8000Hz   Right ear:   40 40 40  40    Left ear:   40 40 40  0    Vision Screening Comments: Vision exam in June 2019 @ [facility]    Exercise Activities and Dietary recommendations Current Exercise Habits: The patient does not participate in regular exercise at present, Exercise limited by: orthopedic condition(s)  Goals    . Increase water intake     Starting 03/28/2018, I will continue to drink at least 6-8 glasses of water daily.        Fall Risk Fall Risk  03/28/2018 12/03/2017 03/25/2017 03/21/2016 06/08/2015  Falls in the past year? No No No No Yes  Number  falls in past yr: - - - - 1  Injury with Fall? - - - - Yes  Risk Factor Category  - - - - (No Data)  Comment - - - - fell out of bed   Depression Screen PHQ 2/9 Scores 03/28/2018 03/25/2017 03/21/2016 06/08/2015  PHQ - 2 Score 0 2 1 0  PHQ- 9 Score 0 7 -  -     Cognitive Function MMSE - Mini Mental State Exam 03/28/2018 03/25/2017 03/21/2016  Orientation to time 5 5 5   Orientation to Place 5 5 5   Registration 3 3 3   Attention/ Calculation 0 0 0  Recall 3 3 3   Language- name 2 objects 0 0 0  Language- repeat 1 1 1   Language- follow 3 step command 3 3 3   Language- read & follow direction 0 0 0  Write a sentence 0 0 0  Copy design 0 0 0  Total score 20 20 20        PLEASE NOTE: A Mini-Cog screen was completed. Maximum score is 20. A value of 0 denotes this part of [place] MMSE was not completed or the patient failed this part of the Mini-Cog screening.   Mini-Cog Screening Orientation to Time - Max 5 pts Orientation to Place - Max 5 pts Registration - Max 3 pts Recall - Max 3 pts Language Repeat - Max 1 pts Language Follow 3 Step Command - Max 3 pts   Immunization History  Administered Date(s) Administered  . Influenza,inj,Quad PF,6+ Mos 03/25/2014, 03/22/2016, 04/04/2017  . Influenza-Unspecified 04/13/2015  . Pneumococcal Conjugate-13 05/05/2015  . Pneumococcal Polysaccharide-23 04/03/2012  . Tdap 10/19/2013  . Zoster 05/11/2013    Screening Tests Health Maintenance  Topic Date Due  . INFLUENZA VACCINE  10/15/2018 (Originally 02/13/2018)  . FOOT EXAM  04/04/2018  . HEMOGLOBIN A1C  09/26/2018  . MAMMOGRAM  10/02/2018  . OPHTHALMOLOGY EXAM  12/28/2018  . COLONOSCOPY  10/10/2022  . TETANUS/TDAP  10/20/2023  . DEXA SCAN  Completed  . Hepatitis C Screening  Completed  . PNA vac Low Risk Adult  Completed     Plan:     I have personally reviewed, addressed, and noted the following in the patient's chart:  A. Medical and social history B. Use of alcohol, tobacco or illicit drugs  C. Current medications and supplements D. Functional ability and status E.  Nutritional status F.  Physical activity G. Advance directives H. List of other physicians I.  Hospitalizations, surgeries, and ER visits in previous 12 months J.    [place] to include hearing, vision, cognitive, depression L. Referrals and appointments - none  In addition, I have reviewed and discussed with patient certain preventive protocols, quality metrics, and best practice recommendations. A written personalized care plan for preventive services as well as general preventive health recommendations were provided to patient.  See attached scanned questionnaire for additional information.   Signed,   Lindell Noe, MHA, BS, LPN Health Coach

## 2018-03-31 NOTE — Telephone Encounter (Signed)
   Primary Cardiologist:Mihai Croitoru, MD  Chart reviewed as part of pre-operative protocol coverage. Because of [name]'s past medical history and time since last visit, he/she will require a follow-up visit in order to better assess preoperative cardiovascular risk.  Pt with chest pain.  Need to be seen this week.  hjx of normal cath but 2012   Pre-op covering staff: - Please schedule appointment and call patient to inform them. - Please contact requesting surgeon's office via preferred method (i.e, phone, fax) to inform them of need for appointment prior to surgery.  Cecilie Kicks, NP  03/31/2018, 3:39 PM

## 2018-03-31 NOTE — Telephone Encounter (Signed)
Called pt re: cardiac clearance and let her know that she has to be seen in order to be cleared for surgery. She has been scheduled to see Fabian Sharp, PA-C 04/04/18. Pt thanked me for the call.

## 2018-04-01 ENCOUNTER — Other Ambulatory Visit: Payer: Self-pay | Admitting: Allergy and Immunology

## 2018-04-01 ENCOUNTER — Other Ambulatory Visit: Payer: Self-pay

## 2018-04-01 ENCOUNTER — Ambulatory Visit (INDEPENDENT_AMBULATORY_CARE_PROVIDER_SITE_OTHER): Payer: Medicare Other | Admitting: Family Medicine

## 2018-04-01 ENCOUNTER — Encounter: Payer: Self-pay | Admitting: Family Medicine

## 2018-04-01 ENCOUNTER — Telehealth: Payer: Self-pay | Admitting: Allergy and Immunology

## 2018-04-01 VITALS — BP 132/80 | HR 91 | Temp 97.6°F | Ht 65.0 in | Wt 224.5 lb

## 2018-04-01 DIAGNOSIS — J4541 Moderate persistent asthma with (acute) exacerbation: Secondary | ICD-10-CM

## 2018-04-01 DIAGNOSIS — I480 Paroxysmal atrial fibrillation: Secondary | ICD-10-CM

## 2018-04-01 DIAGNOSIS — G8929 Other chronic pain: Secondary | ICD-10-CM | POA: Diagnosis not present

## 2018-04-01 DIAGNOSIS — M545 Low back pain, unspecified: Secondary | ICD-10-CM

## 2018-04-01 DIAGNOSIS — I272 Pulmonary hypertension, unspecified: Secondary | ICD-10-CM

## 2018-04-01 DIAGNOSIS — Z23 Encounter for immunization: Secondary | ICD-10-CM | POA: Diagnosis not present

## 2018-04-01 DIAGNOSIS — F325 Major depressive disorder, single episode, in full remission: Secondary | ICD-10-CM | POA: Diagnosis not present

## 2018-04-01 DIAGNOSIS — E0840 Diabetes mellitus due to underlying condition with diabetic neuropathy, unspecified: Secondary | ICD-10-CM | POA: Diagnosis not present

## 2018-04-01 DIAGNOSIS — I10 Essential (primary) hypertension: Secondary | ICD-10-CM | POA: Diagnosis not present

## 2018-04-01 LAB — HM DIABETES FOOT EXAM

## 2018-04-01 MED ORDER — CYCLOBENZAPRINE HCL 10 MG PO TABS: 10.0000 mg | ORAL_TABLET | Freq: Every evening | ORAL | 1 refills | Status: DC | PRN

## 2018-04-01 MED ORDER — LEVALBUTEROL HCL 1.25 MG/3ML IN NEBU: INHALATION_SOLUTION | RESPIRATORY_TRACT | 0 refills | Status: DC

## 2018-04-01 MED ORDER — ACETAMINOPHEN-CODEINE #3 300-30 MG PO TABS: 1.0000 | ORAL_TABLET | Freq: Two times a day (BID) | ORAL | 0 refills | Status: DC | PRN

## 2018-04-01 MED ORDER — CYCLOBENZAPRINE HCL 10 MG PO TABS: 10.0000 mg | ORAL_TABLET | Freq: Two times a day (BID) | ORAL | 1 refills | Status: DC | PRN

## 2018-04-01 NOTE — Assessment & Plan Note (Signed)
Well controlled. Continue current medication.  

## 2018-04-01 NOTE — Assessment & Plan Note (Signed)
Stable control on current regimen. 

## 2018-04-01 NOTE — Telephone Encounter (Signed)
Patient was confused on what medication was sent in after contacting pharmacy they did receive albuterol inhaler patient need nebulizer med Xopenex

## 2018-04-01 NOTE — Assessment & Plan Note (Signed)
Pain poorly controlled.

## 2018-04-01 NOTE — Assessment & Plan Note (Addendum)
Indication for chronic opioid: chronic back pain Medication and dose: tylenol 3 BID # pills per month: 60 Last UDS date: 04/01/18 Opioid Treatment Agreement signed (Y/N): 04/01/18 Opioid Treatment Agreement last reviewed with patient:   04/01/18 NCCSRS reviewed this encounter (include red flags):   04/01/18

## 2018-04-01 NOTE — Assessment & Plan Note (Signed)
Followed by cardiology 

## 2018-04-01 NOTE — Assessment & Plan Note (Signed)
resolved 

## 2018-04-01 NOTE — Telephone Encounter (Signed)
Patient needs a refill on preventil inhaler sent the [facility] on [address] in [place]

## 2018-04-01 NOTE — Progress Notes (Signed)
Subjective:    Patient ID: [name], female    DOB: [birth year], 72 y.o.   MRN: [id number]  HPI   The patient presents for complete physical and review of chronic health problems. He/She also has the following acute concerns today: back pain  The patient saw Candis Musa, LPN for medicare wellness. Note reviewed in detail and important notes copied below. Health maintenance:  Flu vaccine - addressed A1C - completed  Abnormal screenings:   Hearing - failed             Hearing Screening   125Hz  250Hz  500Hz  1000Hz  2000Hz  3000Hz  4000Hz  6000Hz  8000Hz   Right ear:   40 40 40  40    Left ear:   40 40 40  0     04/01/18  Today: 9/13 on day of AMW with Leisa she had CP and back pain severe:   CXR was unremarkable EKG stable Given tylenol number 3 Still with off and on pain in chest with deep breaths and when moves certain ways. Has upcoming appt this week with cardiologist.  Using CPAP for sleep apnea.  Chronic back pain, continued.. See note from 12/2017 for summary pf past eval . Followed by Dr. Gladstone Lighter  Recent MRI lumbar spine 02/26/2018 IMPRESSION: 1. At L4-5 there is a broad-based disc bulge with a left paracentral disc protrusion. Severe bilateral facet arthropathy. Moderate-severe spinal stenosis. 2. Lumbar spine spondylosis   Using ice packs. Using tylenol 3 helps some with pain. Has appt for myelogram for further eval. Right sided back pain is better after treatment by urologist for UTI.   Indication for chronic opioid: chronic back pain Medication and dose: tylenol 3 BID # pills per month: 30 Last UDS date: 04/01/18 Opioid Treatment Agreement signed (Y/N): 04/01/18 Opioid Treatment Agreement last reviewed with patient:   04/01/18 NCCSRS reviewed this encounter (include red flags):   04/01/18.. She previous has had other provides provide meds in last year, but understands she cannot going forward.   Parox afib: followed by cards: anticoag with  xarelto.  Asthma, moderate persistent: Stable control on flovent, spiriva singulair and xopenex prn. Followed by Dr. Neldon Mc allergist.  Diabetes:  With neuropathy  On DPP4 Inh. Lab Results  Component Value Date   HGBA1C 6.3 03/28/2018  Hypoglycemic episodes: Hyperglycemic episodes: Feet problems:no ulcer Blood Sugars averaging: 131 eye exam within last year: 12/2017  Depression; well controlled on cymbalta  Elevated Cholesterol:  Good control on medication Lab Results  Component Value Date   CHOL 154 03/28/2018   HDL 52.90 03/28/2018   LDLCALC 82 03/28/2018   TRIG 94.0 03/28/2018   CHOLHDL 3 03/28/2018   Diet:  good Exercise: limited given back but wants to start water aerobic  Hypertension:   Good control  BP Readings from Last 3 Encounters:  04/01/18 132/80  03/28/18 108/70  03/28/18 108/70  Using medication without problems or lightheadedness: none Chest pain with exertion: none.. Pleuritic pain  Edema:none Short of breath:none Average home BPs: Other issues:  Social History /Family History/Past Medical History reviewed in detail and updated in EMR if needed. Blood pressure 132/80, pulse 91, temperature 97.6 F (36.4 C), temperature source Oral, height 5\' 5"  (1.651 m), weight 224 lb 8 oz (101.8 kg), SpO2 93 %.  Review of Systems  Constitutional: Positive for fatigue. Negative for fever.  HENT: Negative for congestion.   Eyes: Negative for pain.  Respiratory: Negative for cough and shortness of breath.   Cardiovascular: Positive for leg swelling.  Negative for chest pain and palpitations.  Gastrointestinal: Negative for abdominal pain.  Genitourinary: Negative for dysuria and vaginal bleeding.  Musculoskeletal: Negative for back pain.  Neurological: Negative for syncope, light-headedness and headaches.  Psychiatric/Behavioral: Negative for dysphoric mood.       Objective:   Physical Exam  Constitutional: Vital signs are normal. She appears well-developed  and well-nourished. She is cooperative.  Non-toxic appearance. She does not appear ill. No distress.  obesity  HENT:  Head: Normocephalic.  Right Ear: Hearing, tympanic membrane, external ear and ear canal normal.  Left Ear: Hearing, tympanic membrane, external ear and ear canal normal.  Nose: Nose normal.  Eyes: Pupils are equal, round, and reactive to light. Conjunctivae, EOM and lids are normal. Lids are everted and swept, no foreign bodies found.  Neck: Trachea normal and normal range of motion. Neck supple. Carotid bruit is not present. No thyroid mass and no thyromegaly present.  Cardiovascular: Normal rate, S1 normal, S2 normal, normal heart sounds and intact distal pulses. An irregular rhythm present. Exam reveals no gallop.  No murmur heard. Pulmonary/Chest: Effort normal and breath sounds normal. No respiratory distress. She has no wheezes. She has no rhonchi. She has no rales.  Abdominal: Soft. Normal appearance and bowel sounds are normal. She exhibits no distension, no fluid wave, no abdominal bruit and no mass. There is no hepatosplenomegaly. There is no tenderness. There is no rebound, no guarding and no CVA tenderness. No hernia.  Musculoskeletal:       Right hip: She exhibits decreased range of motion, decreased strength and tenderness.       Left hip: She exhibits decreased range of motion, decreased strength and tenderness.       Lumbar back: She exhibits decreased range of motion, tenderness and bony tenderness.  Bilateral  Sciatic notch pain.  Lymphadenopathy:    She has no cervical adenopathy.    She has no axillary adenopathy.  Neurological: She is alert. She has normal strength. No cranial nerve deficit or sensory deficit.  Skin: Skin is warm, dry and intact. No rash noted.  Psychiatric: Her speech is normal and behavior is normal. Judgment normal. Her mood appears not anxious. Cognition and memory are normal. She does not exhibit a depressed mood.            Assessment & Plan:  The patient's preventative maintenance and recommended screening tests for an annual wellness exam were reviewed in full today. Brought up to date unless services declined.  Counselled on the importance of diet, exercise, and its role in overall health and mortality. The patient's FH and SH was reviewed, including their home life, tobacco status, and drug and alcohol status.   Vaccines: uptodate, due for flu Colon: 2014 nml repeat in 2024 Mammogram: uptodate 09/2017 Hep C: neg nonsmoker

## 2018-04-01 NOTE — Patient Instructions (Signed)
Water aerobic are a great idea.

## 2018-04-01 NOTE — Progress Notes (Signed)
I reviewed health advisor's note, was available for consultation, and agree with documentation and plan.  

## 2018-04-01 NOTE — Telephone Encounter (Signed)
Left message advising 3 month supply was sent in on 03/26/18 and to call us with questions

## 2018-04-03 NOTE — Progress Notes (Signed)
Cardiology Office Note:    Date:  04/04/2018   ID:  [name], [name] 1945-08-14, MRN [id number]  PCP:  [name], MD  Cardiologist:  Sanda Klein, MD   Referring MD: [name], MD   Chief Complaint  Patient presents with  . Follow-up    PAF    History of Present Illness:    [name] is a 72 y.o. female with a hx of DVT/PE, PAF on xarelto, moderate pulmonary hypertension, chronic reactive airway disease, and DJD. Heart cath 2012 with normal cors. She had an equivocal workup for hypercoagulable conditions (lupus anticoagulant positive on one of 2 separate assays, protein S activity decreased with normal total protein S level). Echo 2016 with normal EF and grade 2 DD, PA peak pressure 46 mmHg. She last saw [name] [facility] 04/15/17 after a bout of chest pain though to be noncardiac in nature.   She returns today for follow up and surgical clearance. She has had bouts of chest pain that radiate to her back. Chest pain is difficult to describe because she is in chronic back pain now. She also reports some chest pain with cough. She is worried about her heart today. She recently had pain on her right side and was subsequently diagnosed with UTI. ABX to treat wit UTI has resolved her right-sided pain. Mobility is limited by her chronic back pain, so its unclear if she is having exertional chest pain. She is having no bleeding problems on xarelto. Today she is in normal sinus with PACs.   Past Medical History:  Diagnosis Date  . Allergic rhinitis   . Allergy   . Arthritis   . Asthma   . Chronic headache   . Diabetes mellitus   . DVT (deep venous thrombosis) ([place])   . Dyspnea   . Heart murmur   . Hypertension   . Pulmonary embolism ([place])   . Pulmonary hypertension ([place])   . Seizures ([place])     Past Surgical History:  Procedure Laterality Date  . ABDOMINAL HYSTERECTOMY     partial, has ovaries  . CARDIAC CATHETERIZATION  12/28/2010   Mod. pulmonary hypertension,  normal coronary arteries  . DOPPLER ECHOCARDIOGRAPHY  10/08/2011   EF=>55%,mild asymmetric LVH, mod. TR, mod. PH, mild to mod LA dilatation  . KNEE ARTHROSCOPY Left   . KNEE SURGERY    . Nuclear Stress Test  05/20/2006   No ischemia  . PARTIAL HYSTERECTOMY    . PLANTAR FASCIA SURGERY    . TONSILLECTOMY      Current Medications: Current Meds  Medication Sig  . acetaminophen-codeine (TYLENOL #3) 300-30 MG tablet Take 1 tablet by mouth every 12 (twelve) hours as needed for up to 7 days for moderate pain.  Marland Kitchen ADVAIR HFA 230-21 MCG/ACT inhaler INHALE 2 PUFFS BY MOUTH TWICE DAILY  . albuterol (PROVENTIL HFA;VENTOLIN HFA) 108 (90 Base) MCG/ACT inhaler INHALE 2 PUFFS BY MOUTH INTO THE LUNGS EVERY 6 HOURS AS NEEDED FOR WHEEZING OR SHORTNESS OF BREATH  . APPLE CIDER VINEGAR PO Take 2 tablets by mouth 2 (two) times daily.  Marland Kitchen azelastine (ASTELIN) 0.1 % nasal spray USE 1-2 SPRAYS IN EACH NOSTRIL TWICE DAILY AS NEEDED  . cyclobenzaprine (FLEXERIL) 10 MG tablet Take 1 tablet (10 mg total) by mouth at bedtime as needed for muscle spasms.  Marland Kitchen diltiazem (CARTIA XT) 120 MG 24 hr capsule Take 1 capsule (120 mg total) by mouth daily. KEEP OV.  . DULoxetine (CYMBALTA) 30 MG  capsule Take 1 capsule (30 mg total) by mouth daily.  . Empagliflozin-Linagliptin (GLYXAMBI) 10-5 MG TABS Take 1 tablet by mouth daily.  Marland Kitchen EPINEPHrine 0.3 mg/0.3 mL IJ SOAJ injection USE AS DIRECTED FOR LIFE THREATENING ALLERGIC REACTIONS  . fesoterodine (TOVIAZ) 8 MG TB24 tablet Take 8 mg by mouth daily.  . fluticasone (FLOVENT HFA) 220 MCG/ACT inhaler Inhale 2 puffs into the lungs 2 (two) times daily as needed.  . furosemide (LASIX) 40 MG tablet TAKE 1 TABLET(40 MG) BY MOUTH DAILY  . gabapentin (NEURONTIN) 300 MG capsule Take 600 mg by mouth 3 (three) times daily.  . hydrOXYzine (ATARAX/VISTARIL) 10 MG tablet TK 1 TO 2 TS PO HS  . irbesartan (AVAPRO) 150 MG tablet TAKE 1 TABLET(150 MG) BY MOUTH DAILY  . Lactase (DAIRY-RELIEF PO) Take 1  capsule by mouth daily as needed (stomach upset).   Marland Kitchen levalbuterol (XOPENEX) 1.25 MG/3ML nebulizer solution USE 1 VIAL VIA NEBULIZER EVERY 4 HOURS AS NEEDED FOR WHEEZING  . Magnesium 250 MG TABS Take 1 tablet by mouth daily.  . methimazole (TAPAZOLE) 10 MG tablet Take 10 mg in a.m. and 5 mg in p.m. by mouth, with meals  . mirabegron ER (MYRBETRIQ) 50 MG TB24 tablet Take 50 mg by mouth daily.  . montelukast (SINGULAIR) 10 MG tablet TAKE 1 TABLET BY MOUTH EVERY DAY  . ONETOUCH DELICA LANCETS 61P MISC as needed (blood check).   [name] VERIO test strip 1 each by Other route as needed (blood monitoring).   . pantoprazole (PROTONIX) 40 MG tablet TAKE 1 TABLET BY MOUTH EVERY DAY  . PATADAY 0.2 % SOLN INT 1 GTT IN OU QD PRN  . polyethylene glycol (MIRALAX / GLYCOLAX) packet Take 17 g by mouth daily.  . Probiotic Product (ALIGN) 4 MG CAPS Take 1 capsule by mouth daily.  . ranitidine (ZANTAC) 300 MG tablet TAKE 1 TABLET BY MOUTH EVERY NIGHT AT BEDTIME  . rivaroxaban (XARELTO) 20 MG TABS tablet Take 1 tablet (20 mg total) by mouth daily with supper.  Marland Kitchen SPIRIVA RESPIMAT 1.25 MCG/ACT AERS INHALE 2 PUFFS BY MOUTH EVERY DAY  . SUMAtriptan (IMITREX) 100 MG tablet Take 100 mg x 1 may, repeat in 2 hour for migraine.  . tolterodine (DETROL LA) 4 MG 24 hr capsule Take 4 mg by mouth daily.   Current Facility-Administered Medications for the 04/04/18 encounter (Office Visit) with Ledora Bottcher, PA  Medication  . omalizumab Arvid Right) injection 300 mg     Allergies:   Latex; Penicillins; Lyrica [pregabalin]; Phenytoin sodium extended; and Vicodin [hydrocodone-acetaminophen]   Social History   Socioeconomic History  . Marital status: Widowed    Spouse name: Not on file  . Number of children: Y  . Years of education: Not on file  . Highest education level: Not on file  Occupational History  . Occupation: retired Geologist, engineering.   Social Needs  . Financial resource strain: Not on file  . Food  insecurity:    Worry: Not on file    Inability: Not on file  . Transportation needs:    Medical: Not on file    Non-medical: Not on file  Tobacco Use  . Smoking status: Never Smoker  . Smokeless tobacco: Never Used  Substance and Sexual Activity  . Alcohol use: Yes    Alcohol/week: 0.0 standard drinks    Comment: occ glass on wine  . Drug use: No  . Sexual activity: Never  Lifestyle  . Physical activity:  Days per week: Not on file    Minutes per session: Not on file  . Stress: Not on file  Relationships  . Social connections:    Talks on phone: Not on file    Gets together: Not on file    Attends religious service: Not on file    Active member of club or organization: Not on file    Attends meetings of clubs or organizations: Not on file    Relationship status: Not on file  Other Topics Concern  . Not on file  Social History Narrative   Widow    limited exercise.     Family History: The patient's family history includes Alcohol abuse in her father; Allergies in her other; Arthritis in her mother and sister; Breast cancer in her mother; Cancer in her brother; Clotting disorder in her mother; Diabetes in her mother and sister; Hypertension in her mother; Multiple sclerosis in her sister; Stroke in her mother.  ROS:   Please see the history of present illness.     All other systems reviewed and are negative.  EKGs/Labs/Other Studies Reviewed:    The following studies were reviewed today:  Echo 04/20/15: Study Conclusions - Left ventricle: The cavity size was normal. Wall thickness was   increased in a pattern of mild LVH. Systolic function was   vigorous. The estimated ejection fraction was in the range of 65%   to 70%. Features are consistent with a pseudonormal left   ventricular filling pattern, with concomitant abnormal relaxation   and increased filling pressure (grade 2 diastolic dysfunction). - Aortic valve: There was no stenosis. - Mitral valve: Mildly  calcified annulus. There was trivial   regurgitation. - Left atrium: The atrium was mildly dilated. - Right ventricle: The cavity size was normal. Systolic function   was normal. - Right atrium: The atrium was mildly dilated. - Tricuspid valve: Peak RV-RA gradient (S): 43 mm Hg. - Pulmonary arteries: PA peak pressure: 46 mm Hg (S). - Inferior vena cava: The vessel was normal in size. The   respirophasic diameter changes were in the normal range (= 50%),   consistent with normal central venous pressure.  Impressions: - Normal LV size with vigorous systolic function, EF 02-58%. Mild   LV hypertrophy. Normal RV size and systolic function. Mild   pulmonary hypertension.   EKG:  EKG is ordered today.  The ekg ordered today demonstrates sinus with PACs  Recent Labs: 09/27/2017: Hemoglobin 14.5; Platelets 337.0 02/06/2018: TSH <0.01 03/28/2018: ALT 12; BUN 13; Creatinine, Ser 1.12; Potassium 4.5; Sodium 139  Recent Lipid Panel    Component Value Date/Time   CHOL 154 03/28/2018 1149   TRIG 94.0 03/28/2018 1149   HDL 52.90 03/28/2018 1149   CHOLHDL 3 03/28/2018 1149   VLDL 18.8 03/28/2018 1149   LDLCALC 82 03/28/2018 1149    Physical Exam:    VS:  BP (!) 142/80   Pulse 87   Ht 5\' 5"  (1.651 m)   Wt 226 lb (102.5 kg)   BMI 37.61 kg/m     Wt Readings from Last 3 Encounters:  04/04/18 226 lb (102.5 kg)  04/01/18 224 lb 8 oz (101.8 kg)  03/28/18 221 lb (100.2 kg)     GEN: Well nourished, well developed in no acute distress HEENT: Normal NECK: No JVD; No carotid bruits CARDIAC: irregular rhythm, regular rate, no murmurs, rubs, gallops RESPIRATORY:  Wheezing throughout ABDOMEN: Soft, non-tender, non-distended MUSCULOSKELETAL:  No edema; No deformity  SKIN: Warm and  dry NEUROLOGIC:  Alert and oriented x 3 PSYCHIATRIC:  Normal affect   ASSESSMENT:    1. Chronic deep vein thrombosis (DVT) of right lower extremity, unspecified vein (HCC)   2. Precordial pain   3. Essential  hypertension, benign   4. Paroxysmal atrial fibrillation with rapid ventricular response (HCC)   5. Preoperative clearance    PLAN:    In order of problems listed above:  Chronic deep vein thrombosis (DVT) of right lower extremity, unspecified vein (HCC) Doing well on xarelto.   Precordial pain - Plan: EKG 12-Lead, MYOCARDIAL PERFUSION IMAGING She reports bouts of chest pain, but descriptors are difficult to determine secondary to her back pain. It sounds as though she may have had chest pain at rest. He mobility is limited by her back pain, so unclear if she has exertional chest pain. She is worried about her heart today. Will obtain lesiscan moview.   Essential hypertension, benign Pressure is elevated today, but likely secondary to her chronic pain.   Paroxysmal atrial fibrillation with rapid ventricular response (HCC) She is in sinus rhythm today with frequent PACs. No bleeding on xarelto. This patients CHA2DS2-VASc Score and unadjusted Ischemic Stroke Rate (% per year) is equal to 9.7 % stroke rate/year from a score of 7 (age, female, HTN, DM, DVT)   Preoperative clearance Given her bouts of chest pain that are difficult to qualify secondary to chronic back pain, we will obtain lexiscan myoview.    In reference to holding xarelto: per pharmacy and Dr. Sallyanne Kuster: Patient with diagnosis of afib with PE/DVT with noted hypercoagulable state on Xarelto for anticoagulation.    Procedure: lumbar myelogram Date of procedure: TBD  CHADS2-VASc score of  4 (CHF, HTN, AGE, DM2, stroke/tia x 2, CAD, AGE, female)  CrCl 47ml/min  Procedure physician ok with 1 day hold. Generally we hold NOACs for 3 days prior to spinal injection, with history of hypercoagulable state will route to Dr. Sallyanne Kuster for input on length of hold.   Per Dr. Sallyanne Kuster, instructed to hold xarelto for 3 days.   [facility]    Medication Adjustments/Labs and Tests Ordered: Current medicines are reviewed at  length with the patient today.  Concerns regarding medicines are outlined above.  Orders Placed This Encounter  Procedures  . MYOCARDIAL PERFUSION IMAGING  . EKG 12-Lead   No orders of the defined types were placed in this encounter.   Signed, Ledora Bottcher, PA  04/04/2018 11:44 AM    [facility]

## 2018-04-04 ENCOUNTER — Telehealth: Payer: Self-pay | Admitting: *Deleted

## 2018-04-04 ENCOUNTER — Telehealth (HOSPITAL_COMMUNITY): Payer: Self-pay

## 2018-04-04 ENCOUNTER — Encounter: Payer: Self-pay | Admitting: Physician Assistant

## 2018-04-04 ENCOUNTER — Ambulatory Visit (INDEPENDENT_AMBULATORY_CARE_PROVIDER_SITE_OTHER): Payer: Medicare Other | Admitting: Physician Assistant

## 2018-04-04 VITALS — BP 142/80 | HR 87 | Ht 65.0 in | Wt 226.0 lb

## 2018-04-04 DIAGNOSIS — I10 Essential (primary) hypertension: Secondary | ICD-10-CM | POA: Diagnosis not present

## 2018-04-04 DIAGNOSIS — Z01818 Encounter for other preprocedural examination: Secondary | ICD-10-CM

## 2018-04-04 DIAGNOSIS — I82501 Chronic embolism and thrombosis of unspecified deep veins of right lower extremity: Secondary | ICD-10-CM

## 2018-04-04 DIAGNOSIS — R072 Precordial pain: Secondary | ICD-10-CM

## 2018-04-04 DIAGNOSIS — I480 Paroxysmal atrial fibrillation: Secondary | ICD-10-CM

## 2018-04-04 LAB — PAIN MGMT, PROFILE 8 W/CONF, U
6 Acetylmorphine: NEGATIVE ng/mL (ref <10)
Alcohol Metabolites: NEGATIVE ng/mL (ref <500)
Alphahydroxyalprazolam: NEGATIVE ng/mL (ref <25)
Alphahydroxymidazolam: NEGATIVE ng/mL (ref <50)
Alphahydroxytriazolam: NEGATIVE ng/mL (ref <50)
Aminoclonazepam: NEGATIVE ng/mL (ref <25)
Amphetamines: NEGATIVE ng/mL (ref <500)
Benzodiazepines: NEGATIVE ng/mL (ref <100)
Buprenorphine, Urine: NEGATIVE ng/mL (ref <5)
Cocaine Metabolite: NEGATIVE ng/mL (ref <150)
Codeine: 262 ng/mL — ABNORMAL HIGH (ref <50)
Creatinine: 48.5 mg/dL
Hydrocodone: NEGATIVE ng/mL (ref <50)
Hydromorphone: NEGATIVE ng/mL (ref <50)
Hydroxyethylflurazepam: NEGATIVE ng/mL (ref <50)
Lorazepam: NEGATIVE ng/mL (ref <50)
MDMA: NEGATIVE ng/mL (ref <500)
Marijuana Metabolite: NEGATIVE ng/mL (ref <20)
Morphine: 50 ng/mL — ABNORMAL HIGH (ref <50)
Nordiazepam: NEGATIVE ng/mL (ref <50)
Norhydrocodone: NEGATIVE ng/mL (ref <50)
Opiates: POSITIVE ng/mL — AB (ref <100)
Oxazepam: NEGATIVE ng/mL (ref <50)
Oxidant: NEGATIVE ug/mL (ref <200)
Oxycodone: NEGATIVE ng/mL (ref <100)
Temazepam: NEGATIVE ng/mL (ref <50)
pH: 6.37 (ref 4.5–9.0)

## 2018-04-04 NOTE — Telephone Encounter (Signed)
Ms. Harne notified as instructed by telephone.

## 2018-04-04 NOTE — Telephone Encounter (Signed)
Copied from [place] [id number]. Topic: General - Other >> Apr 04, 2018  1:08 PM Carolyn Stare wrote:  Pt was given a muscle relaxer did not no the name cause she  was not at home. She call to ask if she can take more than one and do she only have to take it at night

## 2018-04-04 NOTE — Telephone Encounter (Signed)
Encounter complete. 

## 2018-04-04 NOTE — Telephone Encounter (Signed)
10 mg at a time is max dose and I only recommend it at night given association with sedation.

## 2018-04-04 NOTE — Patient Instructions (Signed)
Medication Instructions:  Your physician recommends that you continue on your current medications as directed. Please refer to the Current Medication list given to you today.  If you need a refill on your cardiac medications before your next appointment, please call your pharmacy.  Labwork: None  [employer] in our office  Testing/Procedures: Your physician has requested that you have a lexiscan myoview. For further information please visit [email or web address]. Please follow instruction sheet, as given. PLEASE SCHEDULE FOR Monday AT 11AM.  Follow-Up: Your physician wants you to follow-up in: [place]. You will receive a reminder letter in the mail two months in advance. If you don't receive a letter, please call our office to schedule the follow-up appointment.  Any Other Special Instructions Will Be Listed Below (If Applicable).

## 2018-04-06 ENCOUNTER — Other Ambulatory Visit: Payer: Self-pay | Admitting: Family Medicine

## 2018-04-07 DIAGNOSIS — M5136 Other intervertebral disc degeneration, lumbar region: Secondary | ICD-10-CM | POA: Diagnosis not present

## 2018-04-07 NOTE — Telephone Encounter (Signed)
Patient just started on APAP with Codeine.  Tramadol not on current medication list.  Probably an automatic refill sent by pharmacy.  Refill?

## 2018-04-08 ENCOUNTER — Ambulatory Visit (HOSPITAL_COMMUNITY)
Admission: RE | Admit: 2018-04-08 | Discharge: 2018-04-08 | Disposition: A | Discharge status: Home / Self Care | Payer: Medicare Other | Source: Ambulatory Visit | Attending: Internal Medicine | Admitting: Internal Medicine

## 2018-04-08 DIAGNOSIS — R072 Precordial pain: Secondary | ICD-10-CM

## 2018-04-08 MED ORDER — TECHNETIUM TC 99M TETROFOSMIN IV KIT
29.6000 | PACK | Freq: Once | INTRAVENOUS | Status: AC | PRN
  Administered 2018-04-08: 29.6 via INTRAVENOUS
  Filled 2018-04-08: qty 30

## 2018-04-08 MED ORDER — REGADENOSON 0.4 MG/5ML IV SOLN
0.4000 mg | Freq: Once | INTRAVENOUS | Status: AC
  Administered 2018-04-08: 0.4 mg via INTRAVENOUS

## 2018-04-09 ENCOUNTER — Ambulatory Visit (HOSPITAL_COMMUNITY)
Admission: RE | Admit: 2018-04-09 | Discharge: 2018-04-09 | Disposition: A | Discharge status: Home / Self Care | Payer: Medicare Other | Source: Ambulatory Visit | Attending: Cardiovascular Disease | Admitting: Cardiovascular Disease

## 2018-04-09 MED ORDER — TECHNETIUM TC 99M TETROFOSMIN IV KIT
31.0000 | PACK | Freq: Once | INTRAVENOUS | Status: AC | PRN
  Administered 2018-04-09: 31 via INTRAVENOUS

## 2018-04-10 ENCOUNTER — Telehealth: Payer: Self-pay | Admitting: Physician Assistant

## 2018-04-10 ENCOUNTER — Ambulatory Visit
Admission: RE | Admit: 2018-04-10 | Discharge: 2018-04-10 | Disposition: A | Discharge status: Home / Self Care | Payer: Medicare Other | Source: Ambulatory Visit | Attending: Orthopedic Surgery | Admitting: Orthopedic Surgery

## 2018-04-10 DIAGNOSIS — M545 Low back pain: Principal | ICD-10-CM

## 2018-04-10 DIAGNOSIS — M5136 Other intervertebral disc degeneration, lumbar region: Secondary | ICD-10-CM | POA: Diagnosis not present

## 2018-04-10 DIAGNOSIS — G8929 Other chronic pain: Secondary | ICD-10-CM

## 2018-04-10 LAB — MYOCARDIAL PERFUSION IMAGING
LV dias vol: 95 mL (ref 46–106)
LV sys vol: 35 mL
Peak HR: 102 {beats}/min
Rest HR: 83 {beats}/min
SDS: 0
SRS: 0
SSS: 0
TID: 0.91

## 2018-04-10 MED ORDER — IOPAMIDOL (ISOVUE-M 200) INJECTION 41%
15.0000 mL | Freq: Once | INTRAMUSCULAR | Status: AC
  Administered 2018-04-10: 15 mL via INTRATHECAL

## 2018-04-10 MED ORDER — DIAZEPAM 5 MG PO TABS
5.0000 mg | ORAL_TABLET | Freq: Once | ORAL | Status: AC
  Administered 2018-04-10: 5 mg via ORAL

## 2018-04-10 NOTE — Telephone Encounter (Signed)
   Primary Cardiologist: Sanda Klein, MD  Chart reviewed as part of pre-operative protocol coverage. Patient was contacted 04/10/2018 in reference to pre-operative risk assessment for pending surgery as outlined below.  [name] was last seen on 04/04/18 by Fabian Sharp PAC.  Since that day, [name] has done well. She complained of atypical chest pain during her clinic visit that was difficult to clarify. She subsequently underwent nuclear medicine stress test on 04/09/18 that was negative for reversible ischemia. No further cardiac testing is needed prior to surgery.   In regards to anticoagulation, see below from our pharmacy staff and Dr. Sallyanne Kuster: Patient with diagnosis ofafib with PE/DVT with noted hypercoagulable stateon Xareltofor anticoagulation.   Procedure:lumbar myelogram Date of procedure:TBD  CHADS2-VASc score of4(CHF, HTN, AGE,DM2, stroke/tia x 2, CAD, AGE, female)  CrCl83ml/min  Procedure physician ok with 1 day hold. Generally we hold NOACs for 3 days prior to spinal injection, with history of hypercoagulable state will route to Dr. Sallyanne Kuster for input on length of hold.  Per Dr. Sallyanne Kuster, instructed to hold xarelto for 3 days.   Therefore, based on ACC/AHA guidelines, the patient would be at acceptable risk for the planned procedure without further cardiovascular testing.   I will route this recommendation to the requesting party via Epic fax function and remove from pre-op pool.  Please call with questions.  Tami Lin Duke, PA 04/10/2018, 1:53 PM

## 2018-04-10 NOTE — Discharge Instructions (Signed)
Myelogram Discharge Instructions  1. Go home and rest quietly for the next 24 hours.  It is important to lie flat for the next 24 hours.  Get up only to go to the restroom.  You may lie in the bed or on a couch on your back, your stomach, your left side or your right side.  You may have one pillow under your head.  You may have pillows between your knees while you are on your side or under your knees while you are on your back.  2. DO NOT drive today.  Recline the seat as far back as it will go, while still wearing your seat belt, on the way home.  3. You may get up to go to the bathroom as needed.  You may sit up for 10 minutes to eat.  You may resume your normal diet and medications unless otherwise indicated.  Drink lots of extra fluids today and tomorrow.  4. The incidence of headache, nausea, or vomiting is about 5% (one in 20 patients).  If you develop a headache, lie flat and drink plenty of fluids until the headache goes away.  Caffeinated beverages may be helpful.  If you develop severe nausea and vomiting or a headache that does not go away with flat bed rest, call [phone].  5. You may resume normal activities after your 24 hours of bed rest is over; however, do not exert yourself strongly or do any heavy lifting tomorrow. If when you get up you have a headache when standing, go back to bed and force fluids for another 24 hours.  6. Call your physician for a follow-up appointment.  The results of your myelogram will be sent directly to your physician by the following day.  7. If you have any questions or if complications develop after you arrive home, please call [phone].  Discharge instructions have been explained to the patient.  The patient, or the person responsible for the patient, fully understands these instructions.  YOU MAY RESTART YOUR XARELTO TODAY. YOU MAY RESTART YOUR IMITREX, TRAMADOL AND CYMBALTA TOMORROW 04/11/2018 AT 1:00PM.

## 2018-04-17 ENCOUNTER — Other Ambulatory Visit: Payer: Self-pay | Admitting: Neurology

## 2018-04-17 DIAGNOSIS — M545 Low back pain: Secondary | ICD-10-CM | POA: Diagnosis not present

## 2018-04-18 ENCOUNTER — Telehealth: Payer: Self-pay

## 2018-04-18 NOTE — Telephone Encounter (Signed)
Pt last seen for annual exam and back pain on 04/01/18.Please advise.

## 2018-04-18 NOTE — Telephone Encounter (Signed)
Agree.. No need for nephrology referral.

## 2018-04-18 NOTE — Telephone Encounter (Signed)
Copied from [place] [id number]. Topic: Referral - Request >> Apr 18, 2018  8:45 AM Valla Leaver wrote: Reason for CRM: Patient needs a referral to nephrology. Low back pain and had a bladder infection per urologist. Patient in a lot of pain and on antibiotic treatment.

## 2018-04-18 NOTE — Telephone Encounter (Signed)
Please call pt and gather more info.. I do not understand why she wants to see nephrologist. He kidney function was normal at recent check.  She is already seeing urologist , who is MD who would help with frequent UTI. ??

## 2018-04-18 NOTE — Telephone Encounter (Signed)
Spoke with [name].  She states her lower back has been hurting her since July.  She has seen her orthopedist and they have done a MRI and Myelogram.  She states that showed arthritis.  They want her to see another doctor for possible spine injections.  She states she is just in severe pain today.  She has also put in a call to her orthopedist today.  She states she was dx with a UTI back in September by her urologist and treated with antibiotics x 3 days.  She states a nurse there mentioned that she might want to see a nephrologist.  I advised that a nephrologist is a specialist that deals with kidney disease, which she does not have.  Her kidney function test were normal at last check so a nephrologist is not going to see her.  I advised that she wait for the ortho office to return her call to see what they recommend but I felt like the spine injections were probably going to be the best next step.

## 2018-04-21 DIAGNOSIS — J4550 Severe persistent asthma, uncomplicated: Secondary | ICD-10-CM

## 2018-04-21 DIAGNOSIS — J3089 Other allergic rhinitis: Secondary | ICD-10-CM | POA: Diagnosis not present

## 2018-04-21 DIAGNOSIS — K219 Gastro-esophageal reflux disease without esophagitis: Secondary | ICD-10-CM | POA: Diagnosis not present

## 2018-04-22 ENCOUNTER — Encounter: Payer: Self-pay | Admitting: Allergy and Immunology

## 2018-04-22 ENCOUNTER — Ambulatory Visit (INDEPENDENT_AMBULATORY_CARE_PROVIDER_SITE_OTHER): Payer: Medicare Other | Admitting: Allergy and Immunology

## 2018-04-22 ENCOUNTER — Ambulatory Visit: Payer: Medicare Other

## 2018-04-22 VITALS — BP 108/70 | HR 72 | Resp 16 | Ht 65.0 in | Wt 222.0 lb

## 2018-04-22 DIAGNOSIS — J4550 Severe persistent asthma, uncomplicated: Secondary | ICD-10-CM

## 2018-04-22 DIAGNOSIS — K219 Gastro-esophageal reflux disease without esophagitis: Secondary | ICD-10-CM

## 2018-04-22 DIAGNOSIS — J3089 Other allergic rhinitis: Secondary | ICD-10-CM | POA: Diagnosis not present

## 2018-04-22 MED ORDER — FLUTICASONE PROPIONATE HFA 220 MCG/ACT IN AERO: 2.0000 | INHALATION_SPRAY | Freq: Two times a day (BID) | RESPIRATORY_TRACT | 5 refills | Status: DC | PRN

## 2018-04-22 MED ORDER — IPRATROPIUM BROMIDE 0.06 % NA SOLN: NASAL | 5 refills | Status: DC

## 2018-04-22 NOTE — Progress Notes (Signed)
Follow-up Note  Referring Provider: [name], MD Primary Provider: [name], MD Date of Office Visit: 04/22/2018  Subjective:   [name] (DOB: January 19, 1946) is a 72 y.o. female who returns to the [facility] [place] on 04/22/2018 in re-evaluation of the following:  HPI: [name] returns to this clinic in evaluation of asthma and allergic rhinitis and LPR and history of pulmonary hypertension and diastolic dysfunction.  Her last visit to this clinic was 14 January 2018.  At that point time she was having bloody postnasal drip which fortunately has completely resolved.  She has done relatively well with her asthma and her nasal issue on her current plan which includes a large collection of anti-inflammatory agents and omalizumab.  However, she has had a problem with some runny nose and some slight cough and wheezing over the course the past 2 weeks or so for which she started Mucinex DM.  She has not had any ugly nasal discharge or anosmia or fever or sputum production or chest pain.  Her reflux is going quite well at this point in time.  She is now on methimazole for Graves' disease.  She has had a significant back issue that has developed this summer.  She has very severe lumbar pain for which she has had multiple imaging procedures which apparently did not identify a disc problem.  She has been on prednisone in August multiple times for this issue.  She is now using Tylenol #3 1 or 2 times per day for pain and she is working towards receiving steroid injections in her back at some point.  Allergies as of 04/22/2018      Reactions   Latex Hives   Penicillins Swelling   Has patient had a PCN reaction causing immediate rash, facial/tongue/throat swelling, SOB or lightheadedness with hypotension patient had a PCN reaction causing severe rash involving mucus membranes or skin necrosis: [id number] Has patient had a PCN reaction that required hospitalization/No Has  patient had a PCN reaction occurring within the last 10 years: No If all of the above answers are "NO", then may proceed with Cephalosporin use.   Lyrica [pregabalin] Other (See Comments)   Blurred vision.   Phenytoin Sodium Extended Swelling   Swelling    Vicodin [hydrocodone-acetaminophen] Nausea And Vomiting      Medication List      acetaminophen-codeine 300-30 MG tablet Commonly known as:  TYLENOL #3 Take 1 tablet by mouth every 4 (four) hours as needed for moderate pain.   ADVAIR HFA 300-92 MCG/ACT inhaler Generic drug:  fluticasone-salmeterol INHALE 2 PUFFS BY MOUTH TWICE DAILY   albuterol 108 (90 Base) MCG/ACT inhaler Commonly known as:  PROVENTIL HFA;VENTOLIN HFA INHALE 2 PUFFS BY MOUTH INTO THE LUNGS EVERY 6 HOURS AS NEEDED FOR WHEEZING OR SHORTNESS OF BREATH   ALIGN 4 MG Caps Take 1 capsule by mouth daily.   APPLE CIDER VINEGAR PO Take 2 tablets by mouth 2 (two) times daily.   azelastine 0.1 % nasal spray Commonly known as:  ASTELIN USE 1-2 SPRAYS IN EACH NOSTRIL TWICE DAILY AS NEEDED   cyclobenzaprine 10 MG tablet Commonly known as:  FLEXERIL Take 1 tablet (10 mg total) by mouth at bedtime as needed for muscle spasms.   DAIRY-RELIEF PO Take 1 capsule by mouth daily as needed (stomach upset).   diltiazem 120 MG 24 hr capsule Commonly known as:  CARDIZEM CD Take 1 capsule (120 mg total) by mouth daily. KEEP OV.  DULoxetine 30 MG capsule Commonly known as:  CYMBALTA Take 1 capsule (30 mg total) by mouth daily.   EPINEPHrine 0.3 mg/0.3 mL Soaj injection Commonly known as:  EPI-PEN USE AS DIRECTED FOR LIFE THREATENING ALLERGIC REACTIONS   fluticasone 220 MCG/ACT inhaler Commonly known as:  FLOVENT HFA Inhale 2 puffs into the lungs 2 (two) times daily as needed.   furosemide 40 MG tablet Commonly known as:  LASIX TAKE 1 TABLET(40 MG) BY MOUTH DAILY   gabapentin 300 MG capsule Commonly known as:  NEURONTIN Take 600 mg by mouth 3 (three) times  daily.   GLYXAMBI 10-5 MG Tabs Generic drug:  Empagliflozin-linaGLIPtin Take 1 tablet by mouth daily.   hydrOXYzine 10 MG tablet Commonly known as:  ATARAX/VISTARIL TK 1 TO 2 TS PO HS   irbesartan 150 MG tablet Commonly known as:  AVAPRO TAKE 1 TABLET(150 MG) BY MOUTH DAILY   levalbuterol 1.25 MG/3ML nebulizer solution Commonly known as:  XOPENEX USE 1 VIAL VIA NEBULIZER EVERY 4 HOURS AS NEEDED FOR WHEEZING   Magnesium 250 MG Tabs Take 1 tablet by mouth daily.   methimazole 10 MG tablet Commonly known as:  TAPAZOLE Take 10 mg in a.m. and 5 mg in p.m. by mouth, with meals   montelukast 10 MG tablet Commonly known as:  SINGULAIR TAKE 1 TABLET BY MOUTH EVERY DAY   MYRBETRIQ 50 MG Tb24 tablet Generic drug:  mirabegron ER Take 50 mg by mouth daily.   ONETOUCH DELICA LANCETS 44R Misc as needed (blood check).   ONETOUCH VERIO test strip Generic drug:  glucose blood 1 each by Other route as needed (blood monitoring).   pantoprazole 40 MG tablet Commonly known as:  PROTONIX TAKE 1 TABLET BY MOUTH EVERY DAY   PATADAY 0.2 % Soln Generic drug:  Olopatadine HCl INT 1 GTT IN OU QD PRN   polyethylene glycol packet Commonly known as:  MIRALAX / GLYCOLAX Take 17 g by mouth daily.   ranitidine 300 MG tablet Commonly known as:  ZANTAC TAKE 1 TABLET BY MOUTH EVERY NIGHT AT BEDTIME   rivaroxaban 20 MG Tabs tablet Commonly known as:  XARELTO Take 1 tablet (20 mg total) by mouth daily with supper.   SPIRIVA RESPIMAT 1.25 MCG/ACT Aers Generic drug:  Tiotropium Bromide Monohydrate INHALE 2 PUFFS BY MOUTH EVERY DAY   SUMAtriptan 100 MG tablet Commonly known as:  IMITREX Take 100 mg x 1 may, repeat in 2 hour for migraine.   tolterodine 4 MG 24 hr capsule Commonly known as:  DETROL LA Take 4 mg by mouth daily.   TOVIAZ 8 MG Tb24 tablet Generic drug:  fesoterodine Take 8 mg by mouth daily.       Past Medical History:  Diagnosis Date  . Allergic rhinitis   .  Allergy   . Arthritis   . Asthma   . Chronic headache   . Diabetes mellitus   . DVT (deep venous thrombosis) ([place])   . Dyspnea   . Heart murmur   . Hypertension   . Pulmonary embolism ([place])   . Pulmonary hypertension ([place])   . Seizures ([place])     Past Surgical History:  Procedure Laterality Date  . ABDOMINAL HYSTERECTOMY     partial, has ovaries  . CARDIAC CATHETERIZATION  12/28/2010   Mod. pulmonary hypertension, normal coronary arteries  . DOPPLER ECHOCARDIOGRAPHY  10/08/2011   EF=>55%,mild asymmetric LVH, mod. TR, mod. PH, mild to mod LA dilatation  . KNEE ARTHROSCOPY Left   .  KNEE SURGERY    . Nuclear Stress Test  05/20/2006   No ischemia  . PARTIAL HYSTERECTOMY    . PLANTAR FASCIA SURGERY    . TONSILLECTOMY      Review of systems negative except as noted in HPI / PMHx or noted below:  Review of Systems  Constitutional: Negative.   HENT: Negative.   Eyes: Negative.   Respiratory: Negative.   Cardiovascular: Negative.   Gastrointestinal: Negative.   Genitourinary: Negative.   Musculoskeletal: Negative.   Skin: Negative.   Neurological: Negative.   Endo/Heme/Allergies: Negative.   Psychiatric/Behavioral: Negative.      Objective:   Vitals:   04/22/18 1134  BP: 108/70  Pulse: 72  Resp: 16  SpO2: 98%   Height: 5\' 5"  (165.1 cm)  Weight: 222 lb (100.7 kg)   Physical Exam  Constitutional:  Obvious signs of back pain with wincing and catching her motion with any type of movement.  HENT:  Head: Normocephalic.  Right Ear: Tympanic membrane, external ear and ear canal normal.  Left Ear: Tympanic membrane, external ear and ear canal normal.  Nose: Nose normal. No mucosal edema or rhinorrhea.  Mouth/Throat: Uvula is midline, oropharynx is clear and moist and mucous membranes are normal. No oropharyngeal exudate.  Eyes: Conjunctivae are normal.  Neck: Trachea normal. No tracheal tenderness present. No tracheal deviation present. No thyromegaly present.    Cardiovascular: Normal rate, regular rhythm, S1 normal, S2 normal and normal heart sounds.  No murmur heard. Pulmonary/Chest: Breath sounds normal. No stridor. No respiratory distress. She has no wheezes. She has no rales.  Musculoskeletal: She exhibits no edema.  Lymphadenopathy:       Head (right side): No tonsillar adenopathy present.       Head (left side): No tonsillar adenopathy present.    She has no cervical adenopathy.  Neurological: She is alert.  Skin: No rash noted. She is not diaphoretic. No erythema. Nails show no clubbing.    Diagnostics:    Spirometry was performed and demonstrated an FEV1 of 1.52 at 80 % of predicted.  The patient had an Asthma Control Test with the following results: ACT Total Score: 22.    Assessment and Plan:   1. Asthma, severe persistent, well-controlled   2. Other allergic rhinitis   3. LPRD (laryngopharyngeal reflux disease)     1. Continue Advair 230 - 2 inhalations twice a day  2. Continue Spiriva 2.5 respimat - two inhalations one time per day.  3. Continue Montelukast 10mg  - one tablet one time per day.  4. Continue Xolair and Epi-Pen  5. Continue pantoprazole 40mg  in the AM and Ranitidine 300mg  in the PM  6. If needed:   A. Nasal saline several times per day  B. Mucinex DM two times per day  C. nasal azelastine two sprays each nostril 1-2 times per day  D. Ipratropium 0.06% -1-2 sprays each nostril 1-2 times per day  E. Proventil HFA or albuterol nebulization  7. Add Flovent 220 - 2 inhalations 2 times a day during "FLARE UP"  8. Obtain fall flu vaccine   9.  Pursue plan to fix back pain  10. Return to clinic in February 2020 or earlier if there is a problem.  Monique's biggest problem at this point in time is her back pain.  Although she does have some issues with rhinitis and may be some slight inflammation of her lower airway I suspect those issues can be successfully treated by reintroducing her inhaled steroid  for  a week or 2 and also using some nasal ipratropium to dry up her runny nose.  But her back is a significant issue and it appears to have really impacted her life and I asked her to contact her orthopedic surgeon once again to make sure that there is logistics being performed so that she can receive her spinal steroid injections.  Allena Katz, MD Allergy / Immunology [place]

## 2018-04-22 NOTE — Patient Instructions (Addendum)
  1. Continue Advair 230 - 2 inhalations twice a day  2. Continue Spiriva 2.5 respimat - two inhalations one time per day.  3. Continue Montelukast 10mg  - one tablet one time per day.  4. Continue Xolair and Epi-Pen  5. Continue pantoprazole 40mg  in the AM and Ranitidine 300mg  in the PM  6. If needed:   A. Nasal saline several times per day  B. Mucinex DM two times per day  C. nasal azelastine two sprays each nostril 1-2 times per day  D. Ipratropium 0.06% -1-2 sprays each nostril 1-2 times per day  E. Proventil HFA or albuterol nebulization  7. Add Flovent 220 - 2 inhalations 2 times a day during "FLARE UP"  8. Obtain fall flu vaccine   9.  Pursue plan to fix back pain  10. Return to clinic in February 2020 or earlier if there is a problem.

## 2018-04-23 ENCOUNTER — Encounter: Payer: Self-pay | Admitting: Allergy and Immunology

## 2018-04-25 DIAGNOSIS — R351 Nocturia: Secondary | ICD-10-CM | POA: Diagnosis not present

## 2018-04-25 DIAGNOSIS — N3941 Urge incontinence: Secondary | ICD-10-CM | POA: Diagnosis not present

## 2018-04-29 DIAGNOSIS — S92345K Nondisplaced fracture of fourth metatarsal bone, left foot, subsequent encounter for fracture with nonunion: Secondary | ICD-10-CM | POA: Diagnosis not present

## 2018-04-29 DIAGNOSIS — S92355K Nondisplaced fracture of fifth metatarsal bone, left foot, subsequent encounter for fracture with nonunion: Secondary | ICD-10-CM | POA: Diagnosis not present

## 2018-04-29 DIAGNOSIS — L600 Ingrowing nail: Secondary | ICD-10-CM | POA: Diagnosis not present

## 2018-04-30 ENCOUNTER — Ambulatory Visit: Payer: Self-pay | Admitting: Neurology

## 2018-04-30 ENCOUNTER — Telehealth: Payer: Self-pay | Admitting: Neurology

## 2018-04-30 NOTE — Telephone Encounter (Signed)
Patient was 20 min late to the apt. The front staff rescheduled her. It is outside of the 90 day window of when she is needing to be seen. I have called the patient and left a message for her to call us back. Patient must be seen by 06/11/2018 for insurance compliance. Advised the patient that she needs to be using the machine more as her current download indicates she has not been compliant.   If patient calls back she can be scheduled with carolyn, megan or Dr Brett Fairy and needs to be before 06/11/2018. Encourage the patient to continue to use her machine greater then 4 hours each night for insurance compliance.

## 2018-04-30 NOTE — Telephone Encounter (Signed)
Pt returning RNs call r/s appt for 11/19 with NP Hoyle Sauer. Pt understood to check in at 1:15 for the 1:45 appt and to continue using her CPAP machine

## 2018-05-01 ENCOUNTER — Encounter: Payer: Self-pay | Admitting: Neurology

## 2018-05-01 DIAGNOSIS — M5136 Other intervertebral disc degeneration, lumbar region: Secondary | ICD-10-CM | POA: Diagnosis not present

## 2018-05-07 ENCOUNTER — Telehealth: Payer: Self-pay | Admitting: Cardiovascular Disease

## 2018-05-07 NOTE — Telephone Encounter (Signed)
New message:       Pt c/o medication issue:  1. Name of Medication: acetaminophen-codeine (TYLENOL #3) 300-30 MG tablet  2. How are you currently taking this medication (dosage and times per day)? Take 1 tablet by mouth every 4 (four) hours as needed for moderate pain.  3. Are you having a reaction (difficulty breathing--STAT)? No  4. What is your medication issue? Pt is calling and has some questions on whether she can take more tylenol.

## 2018-05-07 NOTE — Telephone Encounter (Signed)
Patient wanted to know if she could take another NSAID instead of Tylenol medication. Informed patient that since she is on xarelto, she should avoid any NSAIDS. Patient thought that would be the advise given and thanked me for the call back.

## 2018-05-11 ENCOUNTER — Other Ambulatory Visit: Payer: Self-pay | Admitting: Allergy and Immunology

## 2018-05-12 ENCOUNTER — Telehealth: Payer: Self-pay | Admitting: Neurology

## 2018-05-12 NOTE — Telephone Encounter (Signed)
This comes either from Dr Diona Browner or Dr. Annetta Maw.

## 2018-05-12 NOTE — Telephone Encounter (Signed)
Pt requesting a call stating she would like Dr. Brett Fairy to pick up refills for gabapentin (NEURONTIN) 300 MG capsule please call to advise

## 2018-05-12 NOTE — Telephone Encounter (Signed)
This medication comes from her PCP.> Gabapentin.

## 2018-05-13 ENCOUNTER — Other Ambulatory Visit: Payer: Self-pay | Admitting: Family Medicine

## 2018-05-13 MED ORDER — DULOXETINE HCL 30 MG PO CPEP: 30.0000 mg | ORAL_CAPSULE | Freq: Every day | ORAL | 5 refills | Status: DC

## 2018-05-13 MED ORDER — GABAPENTIN 300 MG PO CAPS: 600.0000 mg | ORAL_CAPSULE | Freq: Three times a day (TID) | ORAL | 1 refills | Status: DC

## 2018-05-13 NOTE — Telephone Encounter (Signed)
Last office visit 04/01/2018 for CPE.  Ok to refill.

## 2018-05-13 NOTE — Telephone Encounter (Signed)
Copied from [place] [phone]. Topic: Quick Communication - Rx Refill/Question >> May 13, 2018 10:29 AM Scherrie Gerlach wrote: Medication: gabapentin (NEURONTIN) 300 MG capsule  Pt want to know if Dr Diona Browner will take over this Rx for her?  She no longer goes to [facility] to neurology who was prescribing.  Also needs refill DULoxetine (CYMBALTA) 30 MG capsule  [id number] [address], [place] AT [address] [phone] (Phone) [phone] (Fax)

## 2018-05-13 NOTE — Telephone Encounter (Signed)
Called the patient and asked who she was initially prescribing those medications and the patient states that she was getting them through the neurologist she was seeing prior to being referred to Dr Brett Fairy. Advised the patient that Dr Brett Fairy would rather the gabapentin and cymbalta come from the patient's PCP if they are willing. Patient states that she will ask them and see if they would be. Informed the patient that I would make Dr Dohmeier aware that these were being prescribed from the previous neurologist the patient was seeing in [place].

## 2018-05-15 ENCOUNTER — Telehealth: Payer: Self-pay | Admitting: Family Medicine

## 2018-05-15 DIAGNOSIS — M5136 Other intervertebral disc degeneration, lumbar region: Secondary | ICD-10-CM | POA: Diagnosis not present

## 2018-05-15 NOTE — Telephone Encounter (Signed)
Patient said she needs to discuss her rx for Gabapentin. Patient said the rx was written for 15 days.  Patient asked if she's suppose to stop the medication after 15 days. Patient uses [facility] on [address]

## 2018-05-16 NOTE — Telephone Encounter (Signed)
#  90 with 1 refill was sent on on 05/13/2018. She needs to call pharmacy and find out why only 15 given.

## 2018-05-19 DIAGNOSIS — J4550 Severe persistent asthma, uncomplicated: Secondary | ICD-10-CM

## 2018-05-19 MED ORDER — GABAPENTIN 300 MG PO CAPS: 600.0000 mg | ORAL_CAPSULE | Freq: Three times a day (TID) | ORAL | 1 refills | Status: DC

## 2018-05-19 NOTE — Addendum Note (Signed)
Addended by: Carter Kitten on: 05/19/2018 12:50 PM   Modules accepted: Orders

## 2018-05-19 NOTE — Telephone Encounter (Signed)
Spoke with [name].  She takes 2 capsules three times a day so needs #180 for a 30 day supply.  Rx resent for #180 with 1 refill as instructed by Dr. Diona Browner.

## 2018-05-19 NOTE — Telephone Encounter (Signed)
Left message for [name] that she will need to check with [facility] and find out why she was only given 15 capsules.  Prescription we sent to them on 05/13/18 was written for #90 with 1 refill.  I ask that she call us back if she has any questions.

## 2018-05-20 ENCOUNTER — Ambulatory Visit (INDEPENDENT_AMBULATORY_CARE_PROVIDER_SITE_OTHER): Payer: Medicare Other | Admitting: *Deleted

## 2018-05-20 DIAGNOSIS — J4550 Severe persistent asthma, uncomplicated: Secondary | ICD-10-CM | POA: Diagnosis not present

## 2018-05-22 ENCOUNTER — Ambulatory Visit: Payer: Medicare Other | Admitting: Cardiovascular Disease

## 2018-05-26 ENCOUNTER — Ambulatory Visit (INDEPENDENT_AMBULATORY_CARE_PROVIDER_SITE_OTHER): Payer: Medicare Other | Admitting: Cardiovascular Disease

## 2018-05-26 ENCOUNTER — Encounter: Payer: Self-pay | Admitting: Cardiovascular Disease

## 2018-05-26 VITALS — BP 136/68 | HR 104 | Ht 65.0 in | Wt 225.4 lb

## 2018-05-26 DIAGNOSIS — Z86711 Personal history of pulmonary embolism: Secondary | ICD-10-CM

## 2018-05-26 DIAGNOSIS — Z7901 Long term (current) use of anticoagulants: Secondary | ICD-10-CM

## 2018-05-26 DIAGNOSIS — I480 Paroxysmal atrial fibrillation: Secondary | ICD-10-CM

## 2018-05-26 DIAGNOSIS — E669 Obesity, unspecified: Secondary | ICD-10-CM | POA: Diagnosis not present

## 2018-05-26 DIAGNOSIS — E1169 Type 2 diabetes mellitus with other specified complication: Secondary | ICD-10-CM | POA: Diagnosis not present

## 2018-05-26 DIAGNOSIS — Z6835 Body mass index (BMI) 35.0-35.9, adult: Secondary | ICD-10-CM

## 2018-05-26 DIAGNOSIS — I2721 Secondary pulmonary arterial hypertension: Secondary | ICD-10-CM

## 2018-05-26 DIAGNOSIS — I10 Essential (primary) hypertension: Secondary | ICD-10-CM

## 2018-05-26 DIAGNOSIS — E7800 Pure hypercholesterolemia, unspecified: Secondary | ICD-10-CM

## 2018-05-26 NOTE — Patient Instructions (Signed)
Medication Instructions:  Dr Croitoru recommends that you continue on your current medications as directed. Please refer to the Current Medication list given to you today.  If you need a refill on your cardiac medications before your next appointment, please call your pharmacy.   Follow-Up: At [facility], you and your health needs are our priority.  As part of our continuing mission to provide you with exceptional heart care, we have created designated Provider Care Teams.  These Care Teams include your primary Cardiologist (physician) and Advanced Practice Providers (APPs -  Physician Assistants and Nurse Practitioners) who all work together to provide you with the care you need, when you need it. You will need a follow up appointment in 12 months.  Please call our office 2 months in advance to schedule this appointment.  You may see Mihai Croitoru, MD or one of the following Advanced Practice Providers on your designated Care Team: Hao Meng, PA-C . Angela Duke, PA-C 

## 2018-05-26 NOTE — Progress Notes (Signed)
Cardiology Office Note    Date:  05/26/2018   ID:  [name], [name] 05-12-1946, MRN [id number]  PCP:  [name], MD  Cardiologist:   Sanda Klein, MD   Chief complaint: Atrial fibrillation follow-up   History of Present Illness:  [name] is a 72 y.o. female with paroxysmal atrial fibrillation, remote DVT/PE, moderate pulmonary hypertension, chronic reactive airway disease and degenerative joint disease returning for follow-up.  She has had a lot of problems with thoracic back pain and low back pain and is wearing a brace.  More severe pain located on the right side resolved after she took antibiotics for infection, but she still has pain in her worse with coughing or certain movements.  The pain was radiating to the front of her chest at one point and she was referred for a nuclear stress test performed in September.  This showed normal perfusion and normal left ventricular ejection fraction.  She is going to the pool for water aerobics.  He has had x-rays and MRI with Dr. Gladstone Lighter: No surgical intervention is planned at this time.  She does not need a furosemide except on an as-needed basis.  She has maintained the weight that she has lost with change in diabetes regimen to include empagliflozin.  She has not had any clinically noticeable episode of atrial fibrillation. The last clinically evident episode of atrial fibrillation was about 2 years ago. She converted spontaneously to sinus rhythm after diltiazem was added for rate control.  She has normal coronary arteries by previous coronary angiography and had moderate pulmonary artery hypertension by right heart catheterization (systolic 50 mmHg).  She had a normal perfusion study in September 2019.  She is taking Xarelto without bleeding problems. She has had a previous equivocal workup for hypercoagulable conditions (lupus anticoagulant positive on one of 2 separate assays, protein S activity decreased with normal total  protein S level).    Past Medical History:  Diagnosis Date  . Allergic rhinitis   . Allergy   . Arthritis   . Asthma   . Chronic headache   . Diabetes mellitus   . DVT (deep venous thrombosis) ([place])   . Dyspnea   . Heart murmur   . Hypertension   . Pulmonary embolism ([place])   . Pulmonary hypertension ([place])   . Seizures ([place])     Past Surgical History:  Procedure Laterality Date  . ABDOMINAL HYSTERECTOMY     partial, has ovaries  . CARDIAC CATHETERIZATION  12/28/2010   Mod. pulmonary hypertension, normal coronary arteries  . DOPPLER ECHOCARDIOGRAPHY  10/08/2011   EF=>55%,mild asymmetric LVH, mod. TR, mod. PH, mild to mod LA dilatation  . KNEE ARTHROSCOPY Left   . KNEE SURGERY    . Nuclear Stress Test  05/20/2006   No ischemia  . PARTIAL HYSTERECTOMY    . PLANTAR FASCIA SURGERY    . TONSILLECTOMY      Current Medications: Outpatient Medications Prior to Visit  Medication Sig Dispense Refill  . acetaminophen-codeine (TYLENOL #3) 300-30 MG tablet Take 1 tablet by mouth every 4 (four) hours as needed for moderate pain.    Marland Kitchen ADVAIR HFA 230-21 MCG/ACT inhaler INHALE 2 PUFFS BY MOUTH TWICE DAILY 12 g 3  . albuterol (PROVENTIL HFA;VENTOLIN HFA) 108 (90 Base) MCG/ACT inhaler INHALE 2 PUFFS BY MOUTH INTO THE LUNGS EVERY 6 HOURS AS NEEDED FOR WHEEZING OR SHORTNESS OF BREATH 54 g 0  . APPLE CIDER VINEGAR PO Take 2 tablets by  mouth 2 (two) times daily.    Marland Kitchen azelastine (ASTELIN) 0.1 % nasal spray USE 1-2 SPRAYS IN EACH NOSTRIL TWICE DAILY AS NEEDED 30 mL 5  . cyclobenzaprine (FLEXERIL) 10 MG tablet Take 1 tablet (10 mg total) by mouth at bedtime as needed for muscle spasms. 30 tablet 1  . diltiazem (CARTIA XT) 120 MG 24 hr capsule Take 1 capsule (120 mg total) by mouth daily. KEEP OV. 30 capsule 2  . DULoxetine (CYMBALTA) 30 MG capsule Take 1 capsule (30 mg total) by mouth daily. 30 capsule 5  . Empagliflozin-Linagliptin (GLYXAMBI) 10-5 MG TABS Take 1 tablet by mouth daily.    Marland Kitchen  EPINEPHrine 0.3 mg/0.3 mL IJ SOAJ injection USE AS DIRECTED FOR LIFE THREATENING ALLERGIC REACTIONS 2 Device 2  . fluticasone (FLOVENT HFA) 220 MCG/ACT inhaler Inhale 2 puffs into the lungs 2 (two) times daily as needed. 1 Inhaler 5  . furosemide (LASIX) 40 MG tablet TAKE 1 TABLET(40 MG) BY MOUTH DAILY 30 tablet 3  . gabapentin (NEURONTIN) 300 MG capsule Take 2 capsules (600 mg total) by mouth 3 (three) times daily. 180 capsule 1  . hydrOXYzine (ATARAX/VISTARIL) 10 MG tablet TK 1 TO 2 TS PO HS  1  . ipratropium (ATROVENT) 0.06 % nasal spray Place 1-2 sprays each nostril 1-2 times per day 15 mL 5  . irbesartan (AVAPRO) 150 MG tablet TAKE 1 TABLET(150 MG) BY MOUTH DAILY 30 tablet 9  . Lactase (DAIRY-RELIEF PO) Take 1 capsule by mouth daily as needed (stomach upset).     Marland Kitchen levalbuterol (XOPENEX) 1.25 MG/3ML nebulizer solution USE 1 VIAL VIA NEBULIZER EVERY 4 HOURS AS NEEDED FOR WHEEZING 1650 mL 0  . Magnesium 250 MG TABS Take 1 tablet by mouth daily.    . methimazole (TAPAZOLE) 10 MG tablet Take 10 mg in a.m. and 5 mg in p.m. by mouth, with meals 60 tablet 3  . montelukast (SINGULAIR) 10 MG tablet TAKE 1 TABLET BY MOUTH EVERY DAY 30 tablet 5  . ONETOUCH DELICA LANCETS 35H MISC as needed (blood check).     [name] VERIO test strip 1 each by Other route as needed (blood monitoring).     . pantoprazole (PROTONIX) 40 MG tablet TAKE 1 TABLET BY MOUTH EVERY DAY 90 tablet 1  . PATADAY 0.2 % SOLN INT 1 GTT IN OU QD PRN  4  . polyethylene glycol (MIRALAX / GLYCOLAX) packet Take 17 g by mouth daily.    . Probiotic Product (ALIGN) 4 MG CAPS Take 1 capsule by mouth daily.    . ranitidine (ZANTAC) 300 MG tablet TAKE 1 TABLET BY MOUTH EVERY NIGHT AT BEDTIME 90 tablet 1  . rivaroxaban (XARELTO) 20 MG TABS tablet Take 1 tablet (20 mg total) by mouth daily with supper. 90 tablet 1  . SPIRIVA RESPIMAT 1.25 MCG/ACT AERS INHALE 2 PUFFS BY MOUTH EVERY DAY 4 g 3  . SUMAtriptan (IMITREX) 100 MG tablet Take 100 mg x 1  may, repeat in 2 hour for migraine. 10 tablet 0  . tolterodine (DETROL LA) 4 MG 24 hr capsule Take 4 mg by mouth daily.    . fesoterodine (TOVIAZ) 8 MG TB24 tablet Take 8 mg by mouth daily.    . mirabegron ER (MYRBETRIQ) 50 MG TB24 tablet Take 50 mg by mouth daily.     Facility-Administered Medications Prior to Visit  Medication Dose Route Frequency Provider Last Rate Last Dose  . omalizumab Arvid Right) injection 300 mg  300 mg Subcutaneous Q28  days [name], MD   300 mg at 05/20/18 1436     Allergies:   Latex; Penicillins; Lyrica [pregabalin]; Phenytoin sodium extended; and Vicodin [hydrocodone-acetaminophen]   Social History   Socioeconomic History  . Marital status: Widowed    Spouse name: Not on file  . Number of children: Y  . Years of education: Not on file  . Highest education level: Not on file  Occupational History  . Occupation: retired Geologist, engineering.   Social Needs  . Financial resource strain: Not on file  . Food insecurity:    Worry: Not on file    Inability: Not on file  . Transportation needs:    Medical: Not on file    Non-medical: Not on file  Tobacco Use  . Smoking status: Never Smoker  . Smokeless tobacco: Never Used  Substance and Sexual Activity  . Alcohol use: Yes    Alcohol/week: 0.0 standard drinks    Comment: occ glass on wine  . Drug use: No  . Sexual activity: Never  Lifestyle  . Physical activity:    Days per week: Not on file    Minutes per session: Not on file  . Stress: Not on file  Relationships  . Social connections:    Talks on phone: Not on file    Gets together: Not on file    Attends religious service: Not on file    Active member of club or organization: Not on file    Attends meetings of clubs or organizations: Not on file    Relationship status: Not on file  Other Topics Concern  . Not on file  Social History Narrative   Widow    limited exercise.     Family History:  The patient's family history includes Alcohol  abuse in her father; Allergies in her other; Arthritis in her mother and sister; Breast cancer in her mother; Cancer in her brother; Clotting disorder in her mother; Diabetes in her mother and sister; Hypertension in her mother; Multiple sclerosis in her sister; Stroke in her mother.   ROS:   Please see the history of present illness.    ROS All other systems reviewed and are negative.   PHYSICAL EXAM:   VS:  BP 136/68   Pulse (!) 104   Ht 5\' 5"  (1.651 m)   Wt 225 lb 6.4 oz (102.2 kg)   BMI 37.51 kg/m      General: Alert, oriented x3, no distress, severely obese Head: no evidence of trauma, PERRL, EOMI, no exophtalmos or lid lag, no myxedema, no xanthelasma; normal ears, nose and oropharynx Neck: normal jugular venous pulsations and no hepatojugular reflux; brisk carotid pulses without delay and no carotid bruits Chest: clear to auscultation, no signs of consolidation by percussion or palpation, normal fremitus, symmetrical and full respiratory excursions Cardiovascular: normal position and quality of the apical impulse, regular rhythm, normal first and second heart sounds, no murmurs, rubs or gallops Abdomen: no tenderness or distention, no masses by palpation, no abnormal pulsatility or arterial bruits, normal bowel sounds, no hepatosplenomegaly Extremities: no clubbing, cyanosis or edema; 2+ radial, ulnar and brachial pulses bilaterally; 2+ right femoral, posterior tibial and dorsalis pedis pulses; 2+ left femoral, posterior tibial and dorsalis pedis pulses; no subclavian or femoral bruits Neurological: grossly nonfocal Psych: Normal mood and affect   Wt Readings from Last 3 Encounters:  05/26/18 225 lb 6.4 oz (102.2 kg)  04/22/18 222 lb (100.7 kg)  04/08/18 226 lb (102.5 kg)  Studies/Labs Reviewed:   EKG:  EKG is not ordered today.   Recent Labs: 09/27/2017: Hemoglobin 14.5; Platelets 337.0 02/06/2018: TSH <0.01 03/28/2018: ALT 12; BUN 13; Creatinine, Ser 1.12; Potassium  4.5; Sodium 139   Lipid Panel    Component Value Date/Time   CHOL 154 03/28/2018 1149   TRIG 94.0 03/28/2018 1149   HDL 52.90 03/28/2018 1149   CHOLHDL 3 03/28/2018 1149   VLDL 18.8 03/28/2018 1149   LDLCALC 82 03/28/2018 1149     ASSESSMENT:    1. Paroxysmal atrial fibrillation (HCC)   2. Essential hypertension   3. History of pulmonary embolism   4. PAH (pulmonary artery hypertension) ([place])   5. Diabetes mellitus type 2 in obese ([place])   6. Hypercholesteremia   7. Class 2 severe obesity due to excess calories with serious comorbidity and body mass index (BMI) of 35.0 to 35.9 in adult ([place])   8. Long term (current) use of anticoagulants      PLAN:  In order of problems listed above:  1. AFib: Asymptomatic.  No clinically detectable episodes in quite a while. CHADSVasc 4 (age, gender, HTN, DM). 2. HTN: Well-controlled. Avoid beta blockers due to reactive airway disease. 3. Hx DVT/PE conflicting results on previous hypercoagulable workup, but this is probably a moot point since she needs to remain on anticoagulation for atrial fibrillation anyway. 4. PAH: This may be related to previous thromboembolic events, as well as obesity, but cannot exclude a component of diastolic left ventricular dysfunction  5. DM: Control remains excellent with last hemoglobin A1c 6.3% and continued weight loss 6. HLP: LDL at target less than 100, normal triglycerides and HDL levels.. 7. Obesity: She has made great progress but remains moderate to severely obese; empagliflozin helping. 8. Xarelto: Briefly interrupted without issue for a lumbar myelogram; requires oral anticoagulants to avoid stroke from atrial fibrillation but also because of history of pulmonary hypertension and previous pulmonary embolism.   Medication Adjustments/Labs and Tests Ordered: Current medicines are reviewed at length with the patient today.  Concerns regarding medicines are outlined above.  Medication changes, Labs and  Tests ordered today are listed in the Patient Instructions below: Patient Instructions  Medication Instructions:  Dr Sallyanne Kuster recommends that you continue on your current medications as directed. Please refer to the Current Medication list given to you today.  If you need a refill on your cardiac medications before your next appointment, please call your pharmacy.   Follow-Up: At [facility], you and your health needs are our priority.  As part of our continuing mission to provide you with exceptional heart care, we have created designated Provider Care Teams.  These Care Teams include your primary Cardiologist (physician) and Advanced Practice Providers (APPs -  Physician Assistants and Nurse Practitioners) who all work together to provide you with the care you need, when you need it. You will need a follow up appointment in 12 months.  Please call our office 2 months in advance to schedule this appointment.  You may see Sanda Klein, MD or one of the following Advanced Practice Providers on your designated Care Team: [place], [place] . Fabian Sharp, PA-C    Signed, Sanda Klein, MD  05/26/2018 1:59 PM    [facility] [place], [place], [place]  [address] Phone: [phone]; Fax: [phone]

## 2018-06-01 ENCOUNTER — Encounter: Payer: Self-pay | Admitting: Nurse Practitioner

## 2018-06-02 ENCOUNTER — Ambulatory Visit: Payer: Medicare Other | Admitting: Internal Medicine

## 2018-06-02 NOTE — Progress Notes (Signed)
[facility]  PATIENT: BRIGGETTE NAJARIAN DOB: 12-Oct-1945   REASON FOR VISIT: Follow-up for obstructive sleep apnea with initial CPAP HISTORY FROM: Patient    HISTORY OF PRESENT ILLNESS:UPDATE 11/19/2019CM Ms. [place], 72 year old female returns for follow-up with history of obstructive sleep apnea here for initial CPAP compliance.  She claims she is still adjusting to her machine.  In addition she has significant back pain and when her back pain is really bad she does not use the CPAP.  Compliance data dated 05/03/2017 to 06/01/2018 shows compliance greater than 4 hours at 40% usage day 23 out of 30 at 77%.  Average usage 5 hours 10 minutes.  Set pressure 5 to 10 cm.  EPR level 3 leak 95th percentile 6.9.  AHI 3.8 ESS 7.  She returns for reevaluation  5/21/19CDMonica DESIRE FULP is a 72 y.o. female patient of African and Native American decent , originally from [place] , seen here  in a referral from Dr. Diona Browner for a sleep evaluation.  Mrs. Gorton had undergone a HST with a [place] and Clinical research associate  and was never given the results, her neurologist in [place] wanted to treat her "hip pain with shots in my back ". She had been referred to Neurology by a [facility] podiarist. She did not have much trust in her doctor anymore and saw Dr Gladstone Lighter. He  wanted to follow up, he did X rays and felt there was no need for injection- and when he got the sleep test results he forwarded them to Dr. Diona Browner and from there to me.  Dr. Larwance Rote ordered to study for 13 March 2017 as a home sleep test.  The study supposedly revealed mild obstructive sleep apnea with an AHI of 15, oxygen nadir was 81%, regular EKG was quoted. No prolonged desaturation.   There was no report of prolonged oxygen desaturation,.  I reviewed the patient's data sheet from her home sleep test which was performed on night 1 machine.   Her AHI overall actually was 15, she did not have central apneas, in supine  position she had more apnea and been on her side, supine AHI was 32.4.  She had lost weight down to 240 pounds form 268, now 225- which may have further lowered the apnea.   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   Sleep habits are as follows:  Before the patient falls asleep she usually watches TV in her bedroom and in her bed.  Sleep onset varies greatly, and she reports that the average sleep onset time is around 1 AM.  Yesterday for example she actually fell asleep already at 9, woke up at midnight, had one bathroom break which is usual for her but usually the bathroom break is between 3 and 4 in the morning but not midnight. She sleeps on only one pillow, but reports GERD.  She shares her bed with her dog, the dog snores. The bedroom is cool, but she sleeps with the TV on.  She sleeps only 2-6 hours nightly. She has reported an average of 4-5 hours. She is unaware of basic sleep hygiene rules. She dreams some night.   REVIEW OF SYSTEMS: Full 14 system review of systems performed and notable only for those listed, all others are neg:  Constitutional: neg  Cardiovascular: neg Ear/Nose/Throat: neg  Skin: neg Eyes: neg Respiratory: neg Gastroitestinal: neg  Hematology/Lymphatic: neg  Endocrine:  neg Musculoskeletal: Back pain Allergy/Immunology: neg Neurological: neg Psychiatric: neg Sleep :  obstructive sleep apnea with CPAP   ALLERGIES: Allergies  Allergen Reactions  . Latex Hives  . Penicillins Swelling    Has patient had a PCN reaction causing immediate rash, facial/tongue/throat swelling, SOB or lightheadedness with hypotension patient had a PCN reaction causing severe rash involving mucus membranes or skin necrosis: [id number] Has patient had a PCN reaction that required hospitalization/No Has patient had a PCN reaction occurring within the last 10 years: No If all of the above answers are  "NO", then may proceed with Cephalosporin use.     Recardo Evangelist [Pregabalin] Other (See Comments)    Blurred vision.  Marland Kitchen Phenytoin Sodium Extended Swelling    Swelling   . Vicodin [Hydrocodone-Acetaminophen] Nausea And Vomiting    HOME MEDICATIONS: Outpatient Medications Prior to Visit  Medication Sig Dispense Refill  . acetaminophen-codeine (TYLENOL #3) 300-30 MG tablet Take 1 tablet by mouth every 4 (four) hours as needed for moderate pain.    Marland Kitchen ADVAIR HFA 230-21 MCG/ACT inhaler INHALE 2 PUFFS BY MOUTH TWICE DAILY 12 g 3  . albuterol (PROVENTIL HFA;VENTOLIN HFA) 108 (90 Base) MCG/ACT inhaler INHALE 2 PUFFS BY MOUTH INTO THE LUNGS EVERY 6 HOURS AS NEEDED FOR WHEEZING OR SHORTNESS OF BREATH 54 g 0  . APPLE CIDER VINEGAR PO Take 2 tablets by mouth 2 (two) times daily.    Marland Kitchen azelastine (ASTELIN) 0.1 % nasal spray USE 1-2 SPRAYS IN EACH NOSTRIL TWICE DAILY AS NEEDED 30 mL 5  . clobetasol cream (TEMOVATE) 0.25 % Apply 1 application topically 2 (two) times daily.    . cyclobenzaprine (FLEXERIL) 10 MG tablet Take 1 tablet (10 mg total) by mouth at bedtime as needed for muscle spasms. 30 tablet 1  . diltiazem (CARTIA XT) 120 MG 24 hr capsule Take 1 capsule (120 mg total) by mouth daily. KEEP OV. 30 capsule 2  . DULoxetine (CYMBALTA) 30 MG capsule Take 1 capsule (30 mg total) by mouth daily. 30 capsule 5  . Empagliflozin-Linagliptin (GLYXAMBI) 10-5 MG TABS Take 1 tablet by mouth daily.    Marland Kitchen EPINEPHrine 0.3 mg/0.3 mL IJ SOAJ injection USE AS DIRECTED FOR LIFE THREATENING ALLERGIC REACTIONS 2 Device 2  . fluconazole (DIFLUCAN) 100 MG tablet Take 100 mg by mouth daily.    . fluticasone (FLOVENT HFA) 220 MCG/ACT inhaler Inhale 2 puffs into the lungs 2 (two) times daily as needed. 1 Inhaler 5  . furosemide (LASIX) 40 MG tablet TAKE 1 TABLET(40 MG) BY MOUTH DAILY 30 tablet 3  . gabapentin (NEURONTIN) 300 MG capsule Take 2 capsules (600 mg total) by mouth 3 (three) times daily. 180 capsule 1  . hydrOXYzine  (ATARAX/VISTARIL) 10 MG tablet TK 1 TO 2 TS PO HS  1  . ipratropium (ATROVENT) 0.06 % nasal spray Place 1-2 sprays each nostril 1-2 times per day 15 mL 5  . irbesartan (AVAPRO) 150 MG tablet TAKE 1 TABLET(150 MG) BY MOUTH DAILY 30 tablet 9  . Lactase (DAIRY-RELIEF PO) Take 1 capsule by mouth daily as needed (stomach upset).     Marland Kitchen levalbuterol (XOPENEX) 1.25 MG/3ML nebulizer solution USE 1 VIAL VIA NEBULIZER EVERY 4 HOURS AS NEEDED FOR WHEEZING 1650 mL 0  . Magnesium 250 MG TABS Take 1 tablet by mouth daily.    . methimazole (TAPAZOLE) 10 MG tablet Take 10 mg in a.m. and 5 mg in p.m. by mouth, with meals 60 tablet 3  . montelukast (SINGULAIR) 10 MG  tablet TAKE 1 TABLET BY MOUTH EVERY DAY 30 tablet 5  . ONETOUCH DELICA LANCETS 17G MISC as needed (blood check).     [name] VERIO test strip 1 each by Other route as needed (blood monitoring).     . pantoprazole (PROTONIX) 40 MG tablet TAKE 1 TABLET BY MOUTH EVERY DAY 90 tablet 1  . PATADAY 0.2 % SOLN INT 1 GTT IN OU QD PRN  4  . polyethylene glycol (MIRALAX / GLYCOLAX) packet Take 17 g by mouth daily.    . Potassium 99 MG TABS Take 99 mg by mouth daily.    . Probiotic Product (ALIGN) 4 MG CAPS Take 1 capsule by mouth daily.    . ranitidine (ZANTAC) 300 MG tablet TAKE 1 TABLET BY MOUTH EVERY NIGHT AT BEDTIME 90 tablet 1  . rivaroxaban (XARELTO) 20 MG TABS tablet Take 1 tablet (20 mg total) by mouth daily with supper. 90 tablet 1  . SPIRIVA RESPIMAT 1.25 MCG/ACT AERS INHALE 2 PUFFS BY MOUTH EVERY DAY 4 g 3  . SUMAtriptan (IMITREX) 100 MG tablet Take 100 mg x 1 may, repeat in 2 hour for migraine. 10 tablet 0  . tolterodine (DETROL LA) 4 MG 24 hr capsule Take 4 mg by mouth daily.     Facility-Administered Medications Prior to Visit  Medication Dose Route Frequency Provider Last Rate Last Dose  . omalizumab Arvid Right) injection 300 mg  300 mg Subcutaneous Q28 days [name], MD   300 mg at 05/20/18 1436    PAST MEDICAL HISTORY: Past Medical  History:  Diagnosis Date  . Allergic rhinitis   . Allergy   . Arthritis   . Asthma   . Chronic headache   . Diabetes mellitus   . DVT (deep venous thrombosis) ([place])   . Dyspnea   . Heart murmur   . Hypertension   . Pulmonary embolism ([place])   . Pulmonary hypertension ([place])   . Seizures ([place])     PAST SURGICAL HISTORY: Past Surgical History:  Procedure Laterality Date  . ABDOMINAL HYSTERECTOMY     partial, has ovaries  . CARDIAC CATHETERIZATION  12/28/2010   Mod. pulmonary hypertension, normal coronary arteries  . DOPPLER ECHOCARDIOGRAPHY  10/08/2011   EF=>55%,mild asymmetric LVH, mod. TR, mod. PH, mild to mod LA dilatation  . KNEE ARTHROSCOPY Left   . KNEE SURGERY    . Nuclear Stress Test  05/20/2006   No ischemia  . PARTIAL HYSTERECTOMY    . PLANTAR FASCIA SURGERY    . TONSILLECTOMY      FAMILY HISTORY: Family History  Problem Relation Age of Onset  . Hypertension Mother   . Clotting disorder Mother   . Breast cancer Mother   . Arthritis Mother   . Stroke Mother   . Diabetes Mother   . Cancer Brother   . Alcohol abuse Father   . Arthritis Sister   . Diabetes Sister   . Multiple sclerosis Sister   . Allergies Other        grandson    SOCIAL HISTORY: Social History   Socioeconomic History  . Marital status: Widowed    Spouse name: Not on file  . Number of children: Y  . Years of education: Not on file  . Highest education level: Not on file  Occupational History  . Occupation: retired Geologist, engineering.   Social Needs  . Financial resource strain: Not on file  . Food insecurity:    Worry: Not on file  Inability: Not on file  . Transportation needs:    Medical: Not on file    Non-medical: Not on file  Tobacco Use  . Smoking status: Never Smoker  . Smokeless tobacco: Never Used  Substance and Sexual Activity  . Alcohol use: Yes    Alcohol/week: 0.0 standard drinks    Comment: occ glass on wine  . Drug use: No  . Sexual activity: Never    Lifestyle  . Physical activity:    Days per week: Not on file    Minutes per session: Not on file  . Stress: Not on file  Relationships  . Social connections:    Talks on phone: Not on file    Gets together: Not on file    Attends religious service: Not on file    Active member of club or organization: Not on file    Attends meetings of clubs or organizations: Not on file    Relationship status: Not on file  . Intimate partner violence:    Fear of current or ex partner: Not on file    Emotionally abused: Not on file    Physically abused: Not on file    Forced sexual activity: Not on file  Other Topics Concern  . Not on file  Social History Narrative   Widow    limited exercise.     PHYSICAL EXAM  Vitals:   06/03/18 1339  BP: 126/80  Pulse: (!) 104  Weight: 224 lb 12.8 oz (102 kg)  Height: 5\' 5"  (1.651 m)   Body mass index is 37.41 kg/m.  Generalized: Well developed, morbidly obese female in no acute distress  Head: normocephalic and atraumatic,. Oropharynx benign [place] 2-3 Neck: Supple, circumference 15.5 Lungs clear Musculoskeletal: No deformity  Skin no rash or edema Neurological examination   Mentation: Alert oriented to time, place, history taking. Attention span and concentration appropriate. Recent and remote memory intact.  Follows all commands speech and language fluent.   Cranial nerve II-XII: Pupils were equal round reactive to light extraocular movements were full, visual field were full on confrontational test. Facial sensation and strength were normal. hearing was intact to finger rubbing bilaterally. Uvula tongue midline. head turning and shoulder shrug were normal and symmetric.Tongue protrusion into cheek strength was normal. Motor: normal bulk and tone, full strength in the BUE, BLE,  Sensory: normal and symmetric to light touch,   Coordination: finger-nose-finger,  no dysmetria Gait and Station: Rising up from seated position without  assistance, normal stance,  moderate stride, good arm swing, smooth turning, ambulates with single-point cane DIAGNOSTIC DATA (LABS, IMAGING, TESTING) - I reviewed patient records, labs, notes, testing and imaging myself where available.  Lab Results  Component Value Date   WBC 9.0 09/27/2017   HGB 14.5 09/27/2017   HCT 44.4 09/27/2017   MCV 77.0 (L) 09/27/2017   PLT 337.0 09/27/2017      Component Value Date/Time   NA 139 03/28/2018 1149   K 4.5 03/28/2018 1149   CL 103 03/28/2018 1149   CO2 25 03/28/2018 1149   GLUCOSE 127 (H) 03/28/2018 1149   BUN 13 03/28/2018 1149   CREATININE 1.12 03/28/2018 1149   CALCIUM 9.3 03/28/2018 1149   PROT 7.1 03/28/2018 1149   ALBUMIN 3.9 03/28/2018 1149   AST 13 03/28/2018 1149   ALT 12 03/28/2018 1149   ALKPHOS 82 03/28/2018 1149   BILITOT 0.7 03/28/2018 1149   GFRNONAA 45 (L) 11/21/2016 1933   GFRAA 52 (L) 11/21/2016 1933  Lab Results  Component Value Date   CHOL 154 03/28/2018   HDL 52.90 03/28/2018   LDLCALC 82 03/28/2018   TRIG 94.0 03/28/2018   CHOLHDL 3 03/28/2018   Lab Results  Component Value Date   HGBA1C 6.3 03/28/2018   Lab Results  Component Value Date   XYIAXKPV37 482 (H) 03/25/2017   Lab Results  Component Value Date   TSH <0.01 (L) 02/06/2018      ASSESSMENT AND PLAN  72 y.o. year old female  has a past medical history of Arthritis, Asthma, Chronic headache, Diabetes mellitus, DVT (deep venous thrombosis) ([place]), Dyspnea, Heart murmur, Hypertension, Pulmonary embolism ([place]), Pulmonary hypertension ([place]), and Seizures ([place]). here to follow-up for newly diagnosed obstructive sleep apnea here for initial CPAP. Compliance data dated 05/03/2017 to 06/01/2018 shows compliance greater than 4 hours at 40% usage day 23 out of 30 at 77%.  Average usage 5 hours 10 minutes.  Set pressure 5 to 10 cm.  EPR level 3 leak 95th percentile 6.9.  AHI 3.8 ESS 7.   PLAN:CPAP compliance 40% greater than 4 hours Continue same  settings Encouraged to use CPAP more often and for longer periods of time Follow-up 3 months for repeat compliance Dennie Bible, [facility], [facility], APRN  [facility] [address] [place], [place] [address] [phone]

## 2018-06-03 ENCOUNTER — Ambulatory Visit (INDEPENDENT_AMBULATORY_CARE_PROVIDER_SITE_OTHER): Payer: Medicare Other | Admitting: Nurse Practitioner

## 2018-06-03 ENCOUNTER — Encounter: Payer: Self-pay | Admitting: Nurse Practitioner

## 2018-06-03 DIAGNOSIS — Z9989 Dependence on other enabling machines and devices: Secondary | ICD-10-CM

## 2018-06-03 DIAGNOSIS — G4733 Obstructive sleep apnea (adult) (pediatric): Secondary | ICD-10-CM | POA: Diagnosis not present

## 2018-06-03 NOTE — Patient Instructions (Signed)
CPAP compliance 40% greater than 4 hours Continue same settings Follow-up 3 months for repeat compliance

## 2018-06-05 ENCOUNTER — Ambulatory Visit (INDEPENDENT_AMBULATORY_CARE_PROVIDER_SITE_OTHER): Payer: Medicare Other | Admitting: Internal Medicine

## 2018-06-05 ENCOUNTER — Encounter: Payer: Self-pay | Admitting: Internal Medicine

## 2018-06-05 VITALS — BP 148/80 | HR 86 | Ht 65.0 in | Wt 224.0 lb

## 2018-06-05 DIAGNOSIS — E1140 Type 2 diabetes mellitus with diabetic neuropathy, unspecified: Secondary | ICD-10-CM | POA: Diagnosis not present

## 2018-06-05 DIAGNOSIS — E041 Nontoxic single thyroid nodule: Secondary | ICD-10-CM | POA: Diagnosis not present

## 2018-06-05 DIAGNOSIS — E0500 Thyrotoxicosis with diffuse goiter without thyrotoxic crisis or storm: Secondary | ICD-10-CM

## 2018-06-05 LAB — T3, FREE: T3, Free: 2.6 pg/mL (ref 2.3–4.2)

## 2018-06-05 LAB — T4, FREE: Free T4: 0.59 ng/dL — ABNORMAL LOW (ref 0.60–1.60)

## 2018-06-05 LAB — TSH: TSH: 6.04 u[IU]/mL — ABNORMAL HIGH (ref 0.35–4.50)

## 2018-06-05 NOTE — Patient Instructions (Signed)
Please continue: - Methimazole 10 mg in am and 5 mg in the pm  Please stop at the lab.  Please come back for a follow-up appointment in 4 months.

## 2018-06-05 NOTE — Progress Notes (Signed)
Patient ID: [name], female   DOB: 02-Jun-1946, 72 y.o.   MRN: [id number]    HPI  [name] is a 72 y.o.-year-old female, returning for f/u for for thyrotoxicosis and thyroid nodule.  She previously saw another endocrinologist (Dr. Wilson Singer), but only for her diabetes. Last OV with me 4 mo ago.  She started water aerobics for back pain.  Patient was found to be thyrotoxic in 08/2015 but she reports that she was only informed about this in 2018.  Thyroid scan indicated possible Graves' disease and also called left thyroid nodule.  This was biopsied with benign results in 11/2017.  After her Graves' diagnosis, she was started on methimazole 10 mg once a day.  At last visit, his TFTs were still abnormal, we increased the dose to 10 mg in a.m. and 5 mg in p.m.  She is on diltiazem CD and we continued this at last visit.  TSI's were also elevated then.  She is feeling better on methimazole, less fatigued.  Reviewed her TFTs: Lab Results  Component Value Date   TSH <0.01 (L) 02/06/2018   TSH <0.01 (L) 09/27/2017   TSH 0.01 (L) 02/05/2017   TSH 0.07 (L) 03/22/2016   TSH 0.06 (L) 12/09/2015   TSH 0.03 Repeated and verified X2. (L) 08/23/2015   TSH 0.59 04/05/2015   TSH 0.467 12/27/2010   FREET4 1.89 (H) 02/06/2018   FREET4 1.94 (H) 09/27/2017   FREET4 1.19 02/05/2017   FREET4 1.91 (H) 03/22/2016   FREET4 1.45 12/09/2015   FREET4 1.84 Repeated and verified X2. (H) 08/23/2015   T3FREE 3.9 02/06/2018   T3FREE 3.4 09/27/2017   T3FREE 3.5 02/05/2017   T3FREE 3.8 03/22/2016   T3FREE 3.9 12/09/2015   Thyroid uptake and scan (10/31/2017): Increased uptake of 40%, and cold left inferior pole nodule.  Thyroid U/S (11/12/2017): 3.2 cm left inferior TR 3 nodule correlates with the cold nodule by nuclear medicine scan. This nodule meets criteria for biopsy as above.  FNA of her left thyroid nodule (12/04/2017): benign  Antithyroid antibodies were high: Lab Results  Component Value Date    TSI 361 (H) 02/06/2018   Pt denies: - feeling nodules in neck - hoarseness - dysphagia - choking - SOB with lying down  Pt does not have a FH of thyroid ds. No FH of thyroid cancer. No h/o radiation tx to head or neck.  No seaweed or kelp. No recent contrast studies. No herbal supplements. No Biotin use. + steroids use -  01/25/2018 had an injection of steroid. She also had Prednisone in 03/2018. She had a steroid inj beginning of 04/2018 in back.  Pt. also has a history of DM2 asthma, PE, urinary urgency.  ROS: Constitutional: no weight gain/no weight loss, no fatigue, no subjective hyperthermia, no subjective hypothermia Eyes: no blurry vision, no xerophthalmia ENT: no sore throat, + see HPI Cardiovascular: no CP/no SOB/no palpitations/no leg swelling Respiratory: no cough/no SOB/+ wheezing Gastrointestinal: no N/no V/no D/no C/+ acid reflux Musculoskeletal: + muscle aches/+ joint aches Skin: no rashes, no hair loss Neurological: no tremors/no numbness/no tingling/no dizziness, + HA  I reviewed pt's medications, allergies, PMH, social hx, family hx, and changes were documented in the history of present illness. Otherwise, unchanged from my initial visit note.  Past Medical History:  Diagnosis Date  . Allergic rhinitis   . Allergy   . Arthritis   . Asthma   . Chronic headache   . Diabetes mellitus   .  DVT (deep venous thrombosis) ([place])   . Dyspnea   . Heart murmur   . Hypertension   . Pulmonary embolism ([place])   . Pulmonary hypertension ([place])   . Seizures ([place])    Past Surgical History:  Procedure Laterality Date  . ABDOMINAL HYSTERECTOMY     partial, has ovaries  . CARDIAC CATHETERIZATION  12/28/2010   Mod. pulmonary hypertension, normal coronary arteries  . DOPPLER ECHOCARDIOGRAPHY  10/08/2011   EF=>55%,mild asymmetric LVH, mod. TR, mod. PH, mild to mod LA dilatation  . KNEE ARTHROSCOPY Left   . KNEE SURGERY    . Nuclear Stress Test  05/20/2006   No ischemia   . PARTIAL HYSTERECTOMY    . PLANTAR FASCIA SURGERY    . TONSILLECTOMY     Social History   Socioeconomic History  . Marital status: Widowed    Spouse name: Not on file  . Number of children: Y  . Years of education: Not on file  . Highest education level: Not on file  Occupational History  . Occupation: retired Geologist, engineering.   Social Needs  . Financial resource strain: Not on file  . Food insecurity:    Worry: Not on file    Inability: Not on file  . Transportation needs:    Medical: Not on file    Non-medical: Not on file  Tobacco Use  . Smoking status: Never Smoker  . Smokeless tobacco: Never Used  Substance and Sexual Activity  . Alcohol use: Yes    Alcohol/week: 0.0 standard drinks    Comment: occ glass on wine  . Drug use: No  . Sexual activity: Never  Lifestyle  . Physical activity:    Days per week: Not on file    Minutes per session: Not on file  . Stress: Not on file  Relationships  . Social connections:    Talks on phone: Not on file    Gets together: Not on file    Attends religious service: Not on file    Active member of club or organization: Not on file    Attends meetings of clubs or organizations: Not on file    Relationship status: Not on file  . Intimate partner violence:    Fear of current or ex partner: Not on file    Emotionally abused: Not on file    Physically abused: Not on file    Forced sexual activity: Not on file  Other Topics Concern  . Not on file  Social History Narrative   Widow    limited exercise.   Current Outpatient Medications on File Prior to Visit  Medication Sig Dispense Refill  . acetaminophen-codeine (TYLENOL #3) 300-30 MG tablet Take 1 tablet by mouth every 4 (four) hours as needed for moderate pain.    Marland Kitchen ADVAIR HFA 230-21 MCG/ACT inhaler INHALE 2 PUFFS BY MOUTH TWICE DAILY 12 g 3  . albuterol (PROVENTIL HFA;VENTOLIN HFA) 108 (90 Base) MCG/ACT inhaler INHALE 2 PUFFS BY MOUTH INTO THE LUNGS EVERY 6 HOURS AS  NEEDED FOR WHEEZING OR SHORTNESS OF BREATH 54 g 0  . APPLE CIDER VINEGAR PO Take 2 tablets by mouth 2 (two) times daily.    Marland Kitchen azelastine (ASTELIN) 0.1 % nasal spray USE 1-2 SPRAYS IN EACH NOSTRIL TWICE DAILY AS NEEDED 30 mL 5  . clobetasol cream (TEMOVATE) 9.38 % Apply 1 application topically 2 (two) times daily.    . cyclobenzaprine (FLEXERIL) 10 MG tablet Take 1 tablet (10 mg total) by mouth at  bedtime as needed for muscle spasms. 30 tablet 1  . diltiazem (CARTIA XT) 120 MG 24 hr capsule Take 1 capsule (120 mg total) by mouth daily. KEEP OV. 30 capsule 2  . DULoxetine (CYMBALTA) 30 MG capsule Take 1 capsule (30 mg total) by mouth daily. 30 capsule 5  . Empagliflozin-Linagliptin (GLYXAMBI) 10-5 MG TABS Take 1 tablet by mouth daily.    Marland Kitchen EPINEPHrine 0.3 mg/0.3 mL IJ SOAJ injection USE AS DIRECTED FOR LIFE THREATENING ALLERGIC REACTIONS 2 Device 2  . fluconazole (DIFLUCAN) 100 MG tablet Take 100 mg by mouth daily.    . fluticasone (FLOVENT HFA) 220 MCG/ACT inhaler Inhale 2 puffs into the lungs 2 (two) times daily as needed. 1 Inhaler 5  . furosemide (LASIX) 40 MG tablet TAKE 1 TABLET(40 MG) BY MOUTH DAILY 30 tablet 3  . gabapentin (NEURONTIN) 300 MG capsule Take 2 capsules (600 mg total) by mouth 3 (three) times daily. 180 capsule 1  . hydrOXYzine (ATARAX/VISTARIL) 10 MG tablet TK 1 TO 2 TS PO HS  1  . ipratropium (ATROVENT) 0.06 % nasal spray Place 1-2 sprays each nostril 1-2 times per day 15 mL 5  . irbesartan (AVAPRO) 150 MG tablet TAKE 1 TABLET(150 MG) BY MOUTH DAILY 30 tablet 9  . Lactase (DAIRY-RELIEF PO) Take 1 capsule by mouth daily as needed (stomach upset).     Marland Kitchen levalbuterol (XOPENEX) 1.25 MG/3ML nebulizer solution USE 1 VIAL VIA NEBULIZER EVERY 4 HOURS AS NEEDED FOR WHEEZING 1650 mL 0  . Magnesium 250 MG TABS Take 1 tablet by mouth daily.    . methimazole (TAPAZOLE) 10 MG tablet Take 10 mg in a.m. and 5 mg in p.m. by mouth, with meals 60 tablet 3  . montelukast (SINGULAIR) 10 MG tablet  TAKE 1 TABLET BY MOUTH EVERY DAY 30 tablet 5  . ONETOUCH DELICA LANCETS 61Y MISC as needed (blood check).     [name] VERIO test strip 1 each by Other route as needed (blood monitoring).     . pantoprazole (PROTONIX) 40 MG tablet TAKE 1 TABLET BY MOUTH EVERY DAY 90 tablet 1  . PATADAY 0.2 % SOLN INT 1 GTT IN OU QD PRN  4  . polyethylene glycol (MIRALAX / GLYCOLAX) packet Take 17 g by mouth daily.    . Potassium 99 MG TABS Take 99 mg by mouth daily.    . Probiotic Product (ALIGN) 4 MG CAPS Take 1 capsule by mouth daily.    . ranitidine (ZANTAC) 300 MG tablet TAKE 1 TABLET BY MOUTH EVERY NIGHT AT BEDTIME 90 tablet 1  . rivaroxaban (XARELTO) 20 MG TABS tablet Take 1 tablet (20 mg total) by mouth daily with supper. 90 tablet 1  . SPIRIVA RESPIMAT 1.25 MCG/ACT AERS INHALE 2 PUFFS BY MOUTH EVERY DAY 4 g 3  . SUMAtriptan (IMITREX) 100 MG tablet Take 100 mg x 1 may, repeat in 2 hour for migraine. 10 tablet 0  . tolterodine (DETROL LA) 4 MG 24 hr capsule Take 4 mg by mouth daily.     Current Facility-Administered Medications on File Prior to Visit  Medication Dose Route Frequency Provider Last Rate Last Dose  . omalizumab Arvid Right) injection 300 mg  300 mg Subcutaneous Q28 days [name], MD   300 mg at 05/20/18 1436   Allergies  Allergen Reactions  . Latex Hives  . Penicillins Swelling    Has patient had a PCN reaction causing immediate rash, facial/tongue/throat swelling, SOB or lightheadedness with hypotension patient  had a PCN reaction causing severe rash involving mucus membranes or skin necrosis: [id number]} Has patient had a PCN reaction that required hospitalization/No Has patient had a PCN reaction occurring within the last 10 years: No If all of the above answers are "NO", then may proceed with Cephalosporin use.     Recardo Evangelist [Pregabalin] Other (See Comments)    Blurred vision.  Marland Kitchen Phenytoin Sodium Extended Swelling    Swelling   . Vicodin [Hydrocodone-Acetaminophen] Nausea  And Vomiting   Family History  Problem Relation Age of Onset  . Hypertension Mother   . Clotting disorder Mother   . Breast cancer Mother   . Arthritis Mother   . Stroke Mother   . Diabetes Mother   . Cancer Brother   . Alcohol abuse Father   . Arthritis Sister   . Diabetes Sister   . Multiple sclerosis Sister   . Allergies Other        grandson    PE: There were no vitals taken for this visit. Wt Readings from Last 3 Encounters:  06/03/18 224 lb 12.8 oz (102 kg)  05/26/18 225 lb 6.4 oz (102.2 kg)  04/22/18 222 lb (100.7 kg)   Constitutional: overweight, in NAD Eyes: PERRLA, EOMI, no exophthalmos, no lid lag, no stare ENT: moist mucous membranes, no thyromegaly, no cervical lymphadenopathy Cardiovascular: RRR, No MRG Respiratory: CTA B Gastrointestinal: abdomen soft, NT, ND, BS+ Musculoskeletal: no deformities, strength intact in all 4 Skin: moist, warm, no rashes Neurological: no tremor with outstretched hands, DTR normal in all 4  ASSESSMENT: 1.  Graves' disease  2.  Left thyroid nodule  3. DM2  PLAN:  1. Patient with 2-year history of thyrotoxic TFTs, with his thyrotoxic symptoms: Fatigue, heat intolerance, insomnia, but no hyper defecation, palpitations (however, she is on diltiazem), anxiety.  She had a thyroid uptake and scan which pointed towards Graves' disease.  TSI antibodies confirmed the diagnosis.  She was started on methimazole, increased at last visit to 10 mg in a.m. and 5 mg in p.m.  She did not return for labs in 1.5 months as suggested.  We will check these labs today. - We continue Cardizem CD at last visit, this is chronic treatment for her - At today's visit, she has no complaints, mentioning that she does feel better on methimazole. - We will check her TSH, free T4, free T3 hoping that we can decrease the methimazole dose soon -We again discussed about possible modalities of treatment for Graves', to include methimazole use, RAI treatment, or,  last resort, surgery.  For now, we will continue methimazole and see how she responds.  Of note, her LFTs and white blood cell count were normal in the past.  We discussed at last visit about possible side effects of methimazole. - I advised her to join my chart to communicate easier - RTC in 4 months, but likely sooner for labs  2.  Left thyroid nodule - cold on her thyroid uptake and scan - Bx was benign (11/2017) - no neck compression sxs - will continue to follow it expectantly for now  3. DM2 - managed by PCP - appears well controlled: Lab Results  Component Value Date   HGBA1C 6.3 03/28/2018   HGBA1C 5.9 03/25/2017   HGBA1C 6.2 08/14/2016   Needs refills MMI.  Component     Latest Ref Rng & Units 06/05/2018  TSH     0.35 - 4.50 uIU/mL 6.04 (H)  T4,Free(Direct)  0.60 - 1.60 ng/dL 0.59 (L)  Triiodothyronine,Free,Serum     2.3 - 4.2 pg/mL 2.6   TSH high, fT4 low >> decrease MMI to 5 mg in am and 2.5 mg in pm. Recheck labs in 5 weeks.  Philemon Kingdom, MD PhD [facility]

## 2018-06-06 MED ORDER — METHIMAZOLE 5 MG PO TABS: ORAL_TABLET | ORAL | 5 refills | Status: DC

## 2018-06-09 ENCOUNTER — Other Ambulatory Visit: Payer: Self-pay | Admitting: Family Medicine

## 2018-06-10 NOTE — Telephone Encounter (Signed)
Last office visit 04/01/2018 for CPE.  Last refilled ? 02/28/2018 for #30 with no refills. UDS/Contract 04/01/2018.  No future appointments.

## 2018-06-15 ENCOUNTER — Other Ambulatory Visit: Payer: Self-pay | Admitting: Allergy and Immunology

## 2018-06-16 DIAGNOSIS — J4550 Severe persistent asthma, uncomplicated: Secondary | ICD-10-CM

## 2018-06-17 ENCOUNTER — Ambulatory Visit (INDEPENDENT_AMBULATORY_CARE_PROVIDER_SITE_OTHER): Payer: Medicare Other | Admitting: *Deleted

## 2018-06-17 DIAGNOSIS — J4550 Severe persistent asthma, uncomplicated: Secondary | ICD-10-CM | POA: Diagnosis not present

## 2018-06-17 DIAGNOSIS — M5136 Other intervertebral disc degeneration, lumbar region: Secondary | ICD-10-CM | POA: Diagnosis not present

## 2018-06-17 DIAGNOSIS — Z6835 Body mass index (BMI) 35.0-35.9, adult: Secondary | ICD-10-CM | POA: Diagnosis not present

## 2018-06-19 ENCOUNTER — Other Ambulatory Visit: Payer: Self-pay | Admitting: Allergy and Immunology

## 2018-06-19 ENCOUNTER — Other Ambulatory Visit: Payer: Self-pay | Admitting: *Deleted

## 2018-06-19 MED ORDER — FLUTICASONE-SALMETEROL 230-21 MCG/ACT IN AERO: INHALATION_SPRAY | RESPIRATORY_TRACT | 3 refills | Status: DC

## 2018-06-20 ENCOUNTER — Telehealth: Payer: Self-pay | Admitting: Cardiovascular Disease

## 2018-06-20 NOTE — Telephone Encounter (Signed)
Routed to primary [place] who has paperwork

## 2018-06-20 NOTE — Telephone Encounter (Signed)
New Message   Patient is calling because she left some information for Dr. Sallyanne Kuster and she wants to know has he been able to review. Please call to discuss.

## 2018-06-20 NOTE — Telephone Encounter (Signed)
Dr Sallyanne Kuster has reviewed brochure for PTNS therapy ([facility]gent [facility]). Though Dr C does not endorse therapy, it should not be harmful for her to proceed with treatment. Returned call to patient. Patient notified.

## 2018-06-24 ENCOUNTER — Ambulatory Visit: Payer: Medicare Other | Admitting: Neurology

## 2018-07-15 ENCOUNTER — Ambulatory Visit: Payer: Medicare Other

## 2018-07-21 ENCOUNTER — Other Ambulatory Visit: Payer: Self-pay | Admitting: Cardiovascular Disease

## 2018-07-27 ENCOUNTER — Other Ambulatory Visit: Payer: Self-pay | Admitting: Cardiovascular Disease

## 2018-07-27 DIAGNOSIS — I10 Essential (primary) hypertension: Secondary | ICD-10-CM

## 2018-07-29 ENCOUNTER — Other Ambulatory Visit: Payer: Medicare Other

## 2018-07-30 DIAGNOSIS — L600 Ingrowing nail: Secondary | ICD-10-CM | POA: Diagnosis not present

## 2018-07-30 DIAGNOSIS — L03032 Cellulitis of left toe: Secondary | ICD-10-CM | POA: Diagnosis not present

## 2018-07-31 DIAGNOSIS — M5136 Other intervertebral disc degeneration, lumbar region: Secondary | ICD-10-CM | POA: Diagnosis not present

## 2018-08-04 DIAGNOSIS — J4550 Severe persistent asthma, uncomplicated: Secondary | ICD-10-CM | POA: Diagnosis not present

## 2018-08-05 ENCOUNTER — Ambulatory Visit (INDEPENDENT_AMBULATORY_CARE_PROVIDER_SITE_OTHER): Payer: Medicare Other | Admitting: *Deleted

## 2018-08-05 ENCOUNTER — Other Ambulatory Visit (INDEPENDENT_AMBULATORY_CARE_PROVIDER_SITE_OTHER): Payer: Medicare Other

## 2018-08-05 DIAGNOSIS — E0500 Thyrotoxicosis with diffuse goiter without thyrotoxic crisis or storm: Secondary | ICD-10-CM

## 2018-08-05 DIAGNOSIS — J4550 Severe persistent asthma, uncomplicated: Secondary | ICD-10-CM | POA: Diagnosis not present

## 2018-08-05 LAB — TSH: TSH: 4.03 u[IU]/mL (ref 0.35–4.50)

## 2018-08-05 LAB — T4, FREE: Free T4: 0.88 ng/dL (ref 0.60–1.60)

## 2018-08-05 LAB — T3, FREE: T3, Free: 2.9 pg/mL (ref 2.3–4.2)

## 2018-08-06 ENCOUNTER — Other Ambulatory Visit: Payer: Self-pay | Admitting: Family Medicine

## 2018-08-06 ENCOUNTER — Telehealth: Payer: Self-pay

## 2018-08-06 NOTE — Telephone Encounter (Signed)
Left message for patient to return our call at [phone].  

## 2018-08-06 NOTE — Telephone Encounter (Signed)
-----   Message from Philemon Kingdom, MD sent at 08/06/2018 11:04 AM EST ----- Lenna Sciara, can you please call pt: Thyroid tests have improved to normal.  We can now decrease the dose of methimazole further to only 5 mg daily.  We will recheck her thyroid test when she comes back in March.

## 2018-08-07 NOTE — Telephone Encounter (Signed)
Notified patient of message from Dr. Gherghe, patient expressed understanding and agreement. No further questions.  

## 2018-08-07 NOTE — Telephone Encounter (Signed)
Last office visit 04/01/2018 CPE.  Last refilled 05/19/2018 for #180 with 1 refill.  No future appointment.

## 2018-08-10 ENCOUNTER — Other Ambulatory Visit: Payer: Self-pay | Admitting: Allergy and Immunology

## 2018-08-11 DIAGNOSIS — S29012A Strain of muscle and tendon of back wall of thorax, initial encounter: Secondary | ICD-10-CM | POA: Diagnosis not present

## 2018-08-11 DIAGNOSIS — M5137 Other intervertebral disc degeneration, lumbosacral region: Secondary | ICD-10-CM | POA: Diagnosis not present

## 2018-08-11 DIAGNOSIS — M9902 Segmental and somatic dysfunction of thoracic region: Secondary | ICD-10-CM | POA: Diagnosis not present

## 2018-08-11 DIAGNOSIS — M9905 Segmental and somatic dysfunction of pelvic region: Secondary | ICD-10-CM | POA: Diagnosis not present

## 2018-08-11 DIAGNOSIS — M9903 Segmental and somatic dysfunction of lumbar region: Secondary | ICD-10-CM | POA: Diagnosis not present

## 2018-08-11 DIAGNOSIS — S39012A Strain of muscle, fascia and tendon of lower back, initial encounter: Secondary | ICD-10-CM | POA: Diagnosis not present

## 2018-08-11 DIAGNOSIS — M5136 Other intervertebral disc degeneration, lumbar region: Secondary | ICD-10-CM | POA: Diagnosis not present

## 2018-08-11 DIAGNOSIS — M9904 Segmental and somatic dysfunction of sacral region: Secondary | ICD-10-CM | POA: Diagnosis not present

## 2018-08-13 ENCOUNTER — Other Ambulatory Visit: Payer: Self-pay | Admitting: Cardiovascular Disease

## 2018-08-13 NOTE — Telephone Encounter (Signed)
Please review for refill, Thanks !  

## 2018-08-14 DIAGNOSIS — M9902 Segmental and somatic dysfunction of thoracic region: Secondary | ICD-10-CM | POA: Diagnosis not present

## 2018-08-14 DIAGNOSIS — M5137 Other intervertebral disc degeneration, lumbosacral region: Secondary | ICD-10-CM | POA: Diagnosis not present

## 2018-08-14 DIAGNOSIS — S29012A Strain of muscle and tendon of back wall of thorax, initial encounter: Secondary | ICD-10-CM | POA: Diagnosis not present

## 2018-08-14 DIAGNOSIS — M9904 Segmental and somatic dysfunction of sacral region: Secondary | ICD-10-CM | POA: Diagnosis not present

## 2018-08-14 DIAGNOSIS — S39012A Strain of muscle, fascia and tendon of lower back, initial encounter: Secondary | ICD-10-CM | POA: Diagnosis not present

## 2018-08-14 DIAGNOSIS — M5136 Other intervertebral disc degeneration, lumbar region: Secondary | ICD-10-CM | POA: Diagnosis not present

## 2018-08-14 DIAGNOSIS — M9905 Segmental and somatic dysfunction of pelvic region: Secondary | ICD-10-CM | POA: Diagnosis not present

## 2018-08-14 DIAGNOSIS — M9903 Segmental and somatic dysfunction of lumbar region: Secondary | ICD-10-CM | POA: Diagnosis not present

## 2018-08-18 DIAGNOSIS — M9905 Segmental and somatic dysfunction of pelvic region: Secondary | ICD-10-CM | POA: Diagnosis not present

## 2018-08-18 DIAGNOSIS — M9903 Segmental and somatic dysfunction of lumbar region: Secondary | ICD-10-CM | POA: Diagnosis not present

## 2018-08-18 DIAGNOSIS — M9904 Segmental and somatic dysfunction of sacral region: Secondary | ICD-10-CM | POA: Diagnosis not present

## 2018-08-18 DIAGNOSIS — M5137 Other intervertebral disc degeneration, lumbosacral region: Secondary | ICD-10-CM | POA: Diagnosis not present

## 2018-08-18 DIAGNOSIS — M9902 Segmental and somatic dysfunction of thoracic region: Secondary | ICD-10-CM | POA: Diagnosis not present

## 2018-08-18 DIAGNOSIS — S29012A Strain of muscle and tendon of back wall of thorax, initial encounter: Secondary | ICD-10-CM | POA: Diagnosis not present

## 2018-08-18 DIAGNOSIS — S39012A Strain of muscle, fascia and tendon of lower back, initial encounter: Secondary | ICD-10-CM | POA: Diagnosis not present

## 2018-08-18 DIAGNOSIS — M5136 Other intervertebral disc degeneration, lumbar region: Secondary | ICD-10-CM | POA: Diagnosis not present

## 2018-08-20 DIAGNOSIS — M5137 Other intervertebral disc degeneration, lumbosacral region: Secondary | ICD-10-CM | POA: Diagnosis not present

## 2018-08-20 DIAGNOSIS — S29012A Strain of muscle and tendon of back wall of thorax, initial encounter: Secondary | ICD-10-CM | POA: Diagnosis not present

## 2018-08-20 DIAGNOSIS — M9905 Segmental and somatic dysfunction of pelvic region: Secondary | ICD-10-CM | POA: Diagnosis not present

## 2018-08-20 DIAGNOSIS — M5136 Other intervertebral disc degeneration, lumbar region: Secondary | ICD-10-CM | POA: Diagnosis not present

## 2018-08-20 DIAGNOSIS — M9903 Segmental and somatic dysfunction of lumbar region: Secondary | ICD-10-CM | POA: Diagnosis not present

## 2018-08-20 DIAGNOSIS — M9902 Segmental and somatic dysfunction of thoracic region: Secondary | ICD-10-CM | POA: Diagnosis not present

## 2018-08-20 DIAGNOSIS — M9904 Segmental and somatic dysfunction of sacral region: Secondary | ICD-10-CM | POA: Diagnosis not present

## 2018-08-20 DIAGNOSIS — S39012A Strain of muscle, fascia and tendon of lower back, initial encounter: Secondary | ICD-10-CM | POA: Diagnosis not present

## 2018-08-22 ENCOUNTER — Ambulatory Visit: Payer: Medicare Other | Admitting: Family Medicine

## 2018-08-22 DIAGNOSIS — K219 Gastro-esophageal reflux disease without esophagitis: Secondary | ICD-10-CM | POA: Diagnosis not present

## 2018-08-22 DIAGNOSIS — R1030 Lower abdominal pain, unspecified: Secondary | ICD-10-CM | POA: Diagnosis not present

## 2018-08-22 DIAGNOSIS — R194 Change in bowel habit: Secondary | ICD-10-CM | POA: Diagnosis not present

## 2018-08-25 DIAGNOSIS — M545 Low back pain: Secondary | ICD-10-CM | POA: Diagnosis not present

## 2018-08-25 DIAGNOSIS — M1711 Unilateral primary osteoarthritis, right knee: Secondary | ICD-10-CM | POA: Diagnosis not present

## 2018-08-26 ENCOUNTER — Ambulatory Visit: Payer: Medicare Other | Admitting: Allergy and Immunology

## 2018-08-27 DIAGNOSIS — S39012A Strain of muscle, fascia and tendon of lower back, initial encounter: Secondary | ICD-10-CM | POA: Diagnosis not present

## 2018-08-27 DIAGNOSIS — M9904 Segmental and somatic dysfunction of sacral region: Secondary | ICD-10-CM | POA: Diagnosis not present

## 2018-08-27 DIAGNOSIS — M9903 Segmental and somatic dysfunction of lumbar region: Secondary | ICD-10-CM | POA: Diagnosis not present

## 2018-08-27 DIAGNOSIS — M5136 Other intervertebral disc degeneration, lumbar region: Secondary | ICD-10-CM | POA: Diagnosis not present

## 2018-08-27 DIAGNOSIS — M5137 Other intervertebral disc degeneration, lumbosacral region: Secondary | ICD-10-CM | POA: Diagnosis not present

## 2018-08-27 DIAGNOSIS — M9902 Segmental and somatic dysfunction of thoracic region: Secondary | ICD-10-CM | POA: Diagnosis not present

## 2018-08-27 DIAGNOSIS — S29012A Strain of muscle and tendon of back wall of thorax, initial encounter: Secondary | ICD-10-CM | POA: Diagnosis not present

## 2018-08-27 DIAGNOSIS — M9905 Segmental and somatic dysfunction of pelvic region: Secondary | ICD-10-CM | POA: Diagnosis not present

## 2018-08-29 ENCOUNTER — Ambulatory Visit (INDEPENDENT_AMBULATORY_CARE_PROVIDER_SITE_OTHER): Payer: Medicare Other | Admitting: Family Medicine

## 2018-08-29 ENCOUNTER — Encounter: Payer: Self-pay | Admitting: Family Medicine

## 2018-08-29 ENCOUNTER — Encounter: Payer: Self-pay | Admitting: *Deleted

## 2018-08-29 VITALS — BP 106/60 | HR 93 | Temp 97.7°F | Ht 65.0 in | Wt 218.8 lb

## 2018-08-29 DIAGNOSIS — G4733 Obstructive sleep apnea (adult) (pediatric): Secondary | ICD-10-CM

## 2018-08-29 DIAGNOSIS — E569 Vitamin deficiency, unspecified: Secondary | ICD-10-CM

## 2018-08-29 DIAGNOSIS — E538 Deficiency of other specified B group vitamins: Secondary | ICD-10-CM | POA: Diagnosis not present

## 2018-08-29 DIAGNOSIS — E559 Vitamin D deficiency, unspecified: Secondary | ICD-10-CM | POA: Diagnosis not present

## 2018-08-29 DIAGNOSIS — G4701 Insomnia due to medical condition: Secondary | ICD-10-CM | POA: Diagnosis not present

## 2018-08-29 DIAGNOSIS — R5383 Other fatigue: Secondary | ICD-10-CM | POA: Diagnosis not present

## 2018-08-29 DIAGNOSIS — G8929 Other chronic pain: Secondary | ICD-10-CM | POA: Diagnosis not present

## 2018-08-29 LAB — CBC WITH DIFFERENTIAL/PLATELET
Absolute Monocytes: 877 cells/uL (ref 200–950)
Basophils Absolute: 43 cells/uL (ref 0–200)
Basophils Relative: 0.4 %
Eosinophils Absolute: 128 cells/uL (ref 15–500)
Eosinophils Relative: 1.2 %
HCT: 41.3 % (ref 35.0–45.0)
Hemoglobin: 13.5 g/dL (ref 11.7–15.5)
Lymphs Abs: 4034 cells/uL — ABNORMAL HIGH (ref 850–3900)
MCH: 24.2 pg — ABNORMAL LOW (ref 27.0–33.0)
MCHC: 32.7 g/dL (ref 32.0–36.0)
MCV: 74.1 fL — ABNORMAL LOW (ref 80.0–100.0)
MPV: 9.7 fL (ref 7.5–12.5)
Monocytes Relative: 8.2 %
Neutro Abs: 5618 cells/uL (ref 1500–7800)
Neutrophils Relative %: 52.5 %
Platelets: 414 10*3/uL — ABNORMAL HIGH (ref 140–400)
RBC: 5.57 10*6/uL — ABNORMAL HIGH (ref 3.80–5.10)
RDW: 14.5 % (ref 11.0–15.0)
Total Lymphocyte: 37.7 %
WBC: 10.7 10*3/uL (ref 3.8–10.8)

## 2018-08-29 NOTE — Addendum Note (Signed)
Addended by: Ellamae Sia on: 08/29/2018 04:16 PM   Modules accepted: Orders

## 2018-08-29 NOTE — Patient Instructions (Signed)
Please stop at the lab to have labs drawn. Can try melatonin 3 mg at night for sleep as well.  No napping during the day.

## 2018-08-29 NOTE — Assessment & Plan Note (Signed)
Likly multifactorial.  In part from poor sleep  CPAP for sleep apnea.Marland Kitchen amy need reassessment.  No clear cardiac cause per symptoms.  begin eval with labs.  Can try melatonin for sleep.

## 2018-08-29 NOTE — Addendum Note (Signed)
Addended by: Ellamae Sia on: 08/29/2018 04:43 PM   Modules accepted: Orders

## 2018-08-29 NOTE — Progress Notes (Signed)
   Subjective:    Patient ID: [name], female    DOB: April 15, 1946, 73 y.o.   MRN: [id number]  HPI  73 year old patient with afib, neuropathy, diabetes, sleep apnea, chronic pain a ( using CPAP nightly) and insomnia presents with decreased energy in the last  1-2 months.  She gets tired out very easily. No CP, no SOB, no edema, no palpitations.  Her insomnia is poorly controlled...  Pain improved but sometimes keeps her up. Sometimes naps during the day.  She is doing water aerobic twice a week.   Recent thyroid labs normal on thyroid meds.   Last A1C showed well contorlled DM.  Lab Results  Component Value Date   HGBA1C 6.3 03/28/2018     Social History /Family History/Past Medical History reviewed in detail and updated in EMR if needed. Blood pressure 106/60, pulse 93, temperature 97.7 F (36.5 C), temperature source Oral, height 5\' 5"  (1.651 m), weight 218 lb 12 oz (99.2 kg).  Review of Systems  Constitutional: Negative for fatigue and fever.  HENT: Negative for congestion.   Eyes: Negative for pain.  Respiratory: Negative for cough and shortness of breath.   Cardiovascular: Negative for chest pain, palpitations and leg swelling.  Gastrointestinal: Negative for abdominal pain.  Genitourinary: Negative for dysuria and vaginal bleeding.  Musculoskeletal: Negative for back pain.  Neurological: Negative for syncope, light-headedness and headaches.  Psychiatric/Behavioral: Negative for dysphoric mood.       Objective:   Physical Exam Vitals signs reviewed.  Constitutional:      Appearance: She is obese.  Neck:     Vascular: No JVD.  Cardiovascular:     Rate and Rhythm: Normal rate and regular rhythm.     Heart sounds: Normal heart sounds.  Pulmonary:     Effort: Pulmonary effort is normal.     Breath sounds: Normal breath sounds.  Chest:     Chest wall: No mass, swelling, tenderness or edema.  Abdominal:     General: Bowel sounds are normal.     Palpations:  Abdomen is soft.     Tenderness: There is no abdominal tenderness.  Skin:    General: Skin is warm and dry.  Neurological:     Mental Status: She is alert and oriented to person, place, and time.           Assessment & Plan:

## 2018-08-30 LAB — VITAMIN B12: Vitamin B-12: 878 pg/mL (ref 200–1100)

## 2018-08-30 LAB — VITAMIN D 25 HYDROXY (VIT D DEFICIENCY, FRACTURES): Vit D, 25-Hydroxy: 23 ng/mL — ABNORMAL LOW (ref 30–100)

## 2018-09-01 ENCOUNTER — Telehealth: Payer: Self-pay | Admitting: Family Medicine

## 2018-09-01 DIAGNOSIS — S39012A Strain of muscle, fascia and tendon of lower back, initial encounter: Secondary | ICD-10-CM | POA: Diagnosis not present

## 2018-09-01 DIAGNOSIS — M9903 Segmental and somatic dysfunction of lumbar region: Secondary | ICD-10-CM | POA: Diagnosis not present

## 2018-09-01 DIAGNOSIS — M9904 Segmental and somatic dysfunction of sacral region: Secondary | ICD-10-CM | POA: Diagnosis not present

## 2018-09-01 DIAGNOSIS — M9905 Segmental and somatic dysfunction of pelvic region: Secondary | ICD-10-CM | POA: Diagnosis not present

## 2018-09-01 DIAGNOSIS — M5136 Other intervertebral disc degeneration, lumbar region: Secondary | ICD-10-CM | POA: Diagnosis not present

## 2018-09-01 DIAGNOSIS — S29012A Strain of muscle and tendon of back wall of thorax, initial encounter: Secondary | ICD-10-CM | POA: Diagnosis not present

## 2018-09-01 DIAGNOSIS — M9902 Segmental and somatic dysfunction of thoracic region: Secondary | ICD-10-CM | POA: Diagnosis not present

## 2018-09-01 DIAGNOSIS — M5137 Other intervertebral disc degeneration, lumbosacral region: Secondary | ICD-10-CM | POA: Diagnosis not present

## 2018-09-01 MED ORDER — VITAMIN D3 1.25 MG (50000 UT) PO CAPS: 1.0000 | ORAL_CAPSULE | ORAL | 0 refills | Status: DC

## 2018-09-01 NOTE — Telephone Encounter (Signed)
Left message that I was returning patient's call.  Will try reaching her again later this afternoon.

## 2018-09-01 NOTE — Telephone Encounter (Signed)
Pt need to speak to you but she wont be back home until after 2pm

## 2018-09-01 NOTE — Addendum Note (Signed)
Addended by: Carter Kitten on: 09/01/2018 10:48 AM   Modules accepted: Orders

## 2018-09-01 NOTE — Telephone Encounter (Signed)
Spoke with [name].  She states when she starts a new medication she usually gets a yeast infection.  She is wanting to know if she should go ahead and start taking something for yeast or wait until she gets a yeast infection.  I advised that hopefully she should not get a yeast infection from Vitamin D3 but if she does to give [place] a call for treatment recommendations at that time.  Patient states understanding.

## 2018-09-02 ENCOUNTER — Ambulatory Visit: Payer: Medicare Other

## 2018-09-02 ENCOUNTER — Encounter: Payer: Self-pay | Admitting: Adult Health

## 2018-09-02 NOTE — Progress Notes (Signed)
[facility]  PATIENT: [name] DOB: January 11, 1946   REASON FOR VISIT: Follow-up for obstructive sleep apnea with  CPAP compliance HISTORY FROM: Patient    HISTORY OF PRESENT ILLNESS:UPDATE 2/19/2020CM [name], 73 year old female returns for follow-up with history of obstructive sleep apnea here for CPAP compliance.  She is doing much better with her machine.  Compliance data dated 08/04/2018-09/02/2018 shows compliance greater than 4 hours at 87% for 26 days.  Average usage 5 hours 52 minutes.  Set pressure 5 to 10 cm.  EPR level 3.  AHI 2.5 ESS 3.  She returns for reevaluation    UPDATE 11/19/2019CM Ms. [name], 73 year old female returns for follow-up with history of obstructive sleep apnea here for initial CPAP compliance.  She claims she is still adjusting to her machine.  In addition she has significant back pain and when her back pain is really bad she does not use the CPAP.  Compliance data dated 05/03/2017 to 06/01/2018 shows compliance greater than 4 hours at 40% usage day 23 out of 30 at 77%.  Average usage 5 hours 10 minutes.  Set pressure 5 to 10 cm.  EPR level 3 leak 95th percentile 6.9.  AHI 3.8 ESS 7.  She returns for reevaluation  5/21/19CDMonica SHRESTA RISDEN is a 73 y.o. female patient of African and Native American decent , originally from [place] , seen here  in a referral from Dr. Diona Browner for a sleep evaluation.  [name] had undergone a HST with a [place] and Clinical research associate  and was never given the results, her neurologist in [place] wanted to treat her "hip pain with shots in my back ". She had been referred to Neurology by a [facility] podiarist. She did not have much trust in her doctor anymore and saw Dr Gladstone Lighter. He  wanted to follow up, he did X rays and felt there was no need for injection- and when he got the sleep test results he forwarded them to Dr. Diona Browner and from there to me.  Dr. Larwance Rote ordered to study for 13 March 2017 as  a home sleep test.  The study supposedly revealed mild obstructive sleep apnea with an AHI of 15, oxygen nadir was 81%, regular EKG was quoted. No prolonged desaturation.   There was no report of prolonged oxygen desaturation,.  I reviewed the patient's data sheet from her home sleep test which was performed on night 1 machine.   Her AHI overall actually was 15, she did not have central apneas, in supine position she had more apnea and been on her side, supine AHI was 32.4.  She had lost weight down to 240 pounds form 268, now 225- which may have further lowered the apnea.   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   Sleep habits are as follows:  Before the patient falls asleep she usually watches TV in her bedroom and in her bed.  Sleep onset varies greatly, and she reports that the average sleep onset time is around 1 AM.  Yesterday for example she actually fell asleep already at 9, woke up at midnight, had one bathroom break which is usual for her but usually the bathroom break is between 3 and 4 in the morning but not midnight. She sleeps on only one pillow, but reports GERD.  She shares her bed with her dog, the dog snores. The bedroom is cool, but she  sleeps with the TV on.  She sleeps only 2-6 hours nightly. She has reported an average of 4-5 hours. She is unaware of basic sleep hygiene rules. She dreams some night.   REVIEW OF SYSTEMS: Full 14 system review of systems performed and notable only for those listed, all others are neg:  Constitutional: neg  Cardiovascular: neg Ear/Nose/Throat: neg  Skin: neg Eyes: neg Respiratory: neg Gastroitestinal: neg  Hematology/Lymphatic: neg  Endocrine: neg Musculoskeletal: Back pain, hip pain Allergy/Immunology: neg Neurological: neg Psychiatric: neg Sleep :  obstructive sleep apnea with CPAP   ALLERGIES: Allergies  Allergen Reactions  . Latex Hives  .  Penicillins Swelling    Has patient had a PCN reaction causing immediate rash, facial/tongue/throat swelling, SOB or lightheadedness with hypotension patient had a PCN reaction causing severe rash involving mucus membranes or skin necrosis: [id number] Has patient had a PCN reaction that required hospitalization/No Has patient had a PCN reaction occurring within the last 10 years: No If all of the above answers are "NO", then may proceed with Cephalosporin use.     [name] [Pregabalin] Other (See Comments)    Blurred vision.  [name] Phenytoin Sodium Extended Swelling    Swelling   . Vicodin [Hydrocodone-Acetaminophen] Nausea And Vomiting    HOME MEDICATIONS: Outpatient Medications Prior to Visit  Medication Sig Dispense Refill  . acetaminophen-codeine (TYLENOL #3) 300-30 MG tablet TAKE 1 TABLET BY MOUTH EVERY 12 HOURS AS NEEDED FOR UP TO 7 DAYS FOR MODERATE PAIN 60 tablet 0  . albuterol (PROVENTIL HFA;VENTOLIN HFA) 108 (90 Base) MCG/ACT inhaler INHALE 2 PUFFS BY MOUTH INTO THE LUNGS EVERY 6 HOURS AS NEEDED FOR WHEEZING OR SHORTNESS OF BREATH 54 g 0  . APPLE CIDER VINEGAR PO Take 2 tablets by mouth 2 (two) times daily.    [name] azelastine (ASTELIN) 0.1 % nasal spray USE 1-2 SPRAYS IN EACH NOSTRIL TWICE DAILY AS NEEDED 30 mL 5  . Cholecalciferol (VITAMIN D3) 1.25 MG (50000 UT) CAPS Take 1 capsule by mouth every 7 (seven) days. 12 capsule 0  . clobetasol cream (TEMOVATE) 0.27 % Apply 1 application topically 2 (two) times daily.    . cyclobenzaprine (FLEXERIL) 10 MG tablet Take 1 tablet (10 mg total) by mouth at bedtime as needed for muscle spasms. 30 tablet 1  . diltiazem (CARTIA XT) 120 MG 24 hr capsule Take 1 capsule (120 mg total) by mouth daily. KEEP OV. 30 capsule 2  . DULoxetine (CYMBALTA) 30 MG capsule Take 1 capsule (30 mg total) by mouth daily. 30 capsule 5  . Empagliflozin-Linagliptin (GLYXAMBI) 10-5 MG TABS Take 1 tablet by mouth daily.    [name] EPINEPHrine 0.3 mg/0.3 mL IJ SOAJ injection  USE AS DIRECTED FOR LIFE THREATENING ALLERGIC REACTIONS 2 Device 2  . fluticasone (FLOVENT HFA) 220 MCG/ACT inhaler Inhale 2 puffs into the lungs 2 (two) times daily as needed. 1 Inhaler 5  . fluticasone-salmeterol (ADVAIR HFA) 230-21 MCG/ACT inhaler Inhale two puffs twice daily to prevent cough or wheeze. Rinse mouth after use. 12 g 3  . furosemide (LASIX) 40 MG tablet TAKE 1 TABLET(40 MG) BY MOUTH DAILY 30 tablet 3  . gabapentin (NEURONTIN) 300 MG capsule TAKE 2 CAPSULES(600 MG) BY MOUTH THREE TIMES DAILY 180 capsule 1  . hydrOXYzine (ATARAX/VISTARIL) 10 MG tablet TK 1 TO 2 TS PO HS  1  . ipratropium (ATROVENT) 0.06 % nasal spray Place 1-2 sprays each nostril 1-2 times per day 15 mL 5  . irbesartan (AVAPRO) 150 MG tablet  TAKE 1 TABLET(150 MG) BY MOUTH DAILY 30 tablet 9  . Lactase (DAIRY-RELIEF PO) Take 1 capsule by mouth daily as needed (stomach upset).     [name] levalbuterol (XOPENEX) 1.25 MG/3ML nebulizer solution USE 1 VIAL VIA NEBULIZER EVERY 4 HOURS AS NEEDED FOR WHEEZING 1650 mL 0  . Magnesium 250 MG TABS Take 1 tablet by mouth daily.    . methimazole (TAPAZOLE) 5 MG tablet Take 5 mg in a.m. and 2.5 mg in p.m. by mouth, with meals 60 tablet 5  . montelukast (SINGULAIR) 10 MG tablet TAKE 1 TABLET BY MOUTH EVERY DAY 90 tablet 1  . ONETOUCH DELICA LANCETS 08M MISC as needed (blood check).     [name] VERIO test strip 1 each by Other route as needed (blood monitoring).     . pantoprazole (PROTONIX) 40 MG tablet TAKE 1 TABLET BY MOUTH EVERY DAY 90 tablet 1  . PATADAY 0.2 % SOLN INT 1 GTT IN OU QD PRN  4  . polyethylene glycol (MIRALAX / GLYCOLAX) packet Take 17 g by mouth daily.    . Potassium 99 MG TABS Take 99 mg by mouth daily.    . Probiotic Product (ALIGN) 4 MG CAPS Take 1 capsule by mouth daily.    . ranitidine (ZANTAC) 300 MG tablet TAKE 1 TABLET BY MOUTH EVERY NIGHT AT BEDTIME 90 tablet 1  . SPIRIVA RESPIMAT 1.25 MCG/ACT AERS INHALE 2 PUFFS BY MOUTH EVERY DAY 4 g 5  . SUMAtriptan  (IMITREX) 100 MG tablet Take 100 mg x 1 may, repeat in 2 hour for migraine. 10 tablet 0  . tolterodine (DETROL LA) 4 MG 24 hr capsule Take 4 mg by mouth daily.    Alveda Reasons 20 MG TABS tablet TAKE 1 TABLET(20 MG) BY MOUTH DAILY WITH SUPPER 90 tablet 1   Facility-Administered Medications Prior to Visit  Medication Dose Route Frequency Provider Last Rate Last Dose  . omalizumab Arvid Right) injection 300 mg  300 mg Subcutaneous Q28 days [name], MD   300 mg at 08/05/18 1230    PAST MEDICAL HISTORY: Past Medical History:  Diagnosis Date  . Allergic rhinitis   . Allergy   . Arthritis   . Asthma   . Chronic headache   . Diabetes mellitus   . DVT (deep venous thrombosis) ([place])   . Dyspnea   . Heart murmur   . Hypertension   . Pulmonary embolism ([place])   . Pulmonary hypertension ([place])   . Seizures ([place])     PAST SURGICAL HISTORY: Past Surgical History:  Procedure Laterality Date  . ABDOMINAL HYSTERECTOMY     partial, has ovaries  . CARDIAC CATHETERIZATION  12/28/2010   Mod. pulmonary hypertension, normal coronary arteries  . DOPPLER ECHOCARDIOGRAPHY  10/08/2011   EF=>55%,mild asymmetric LVH, mod. TR, mod. PH, mild to mod LA dilatation  . KNEE ARTHROSCOPY Left   . KNEE SURGERY    . Nuclear Stress Test  05/20/2006   No ischemia  . PARTIAL HYSTERECTOMY    . PLANTAR FASCIA SURGERY    . TONSILLECTOMY      FAMILY HISTORY: Family History  Problem Relation Age of Onset  . Hypertension Mother   . Clotting disorder Mother   . Breast cancer Mother   . Arthritis Mother   . Stroke Mother   . Diabetes Mother   . Cancer Brother   . Alcohol abuse Father   . Arthritis Sister   . Diabetes Sister   . Multiple sclerosis Sister   .  Allergies Other        grandson    SOCIAL HISTORY: Social History   Socioeconomic History  . Marital status: Widowed    Spouse name: Not on file  . Number of children: Y  . Years of education: Not on file  . Highest education level: Not on file    Occupational History  . Occupation: retired Geologist, engineering.   Social Needs  . Financial resource strain: Not on file  . Food insecurity:    Worry: Not on file    Inability: Not on file  . Transportation needs:    Medical: Not on file    Non-medical: Not on file  Tobacco Use  . Smoking status: Never Smoker  . Smokeless tobacco: Never Used  Substance and Sexual Activity  . Alcohol use: Yes    Alcohol/week: 0.0 standard drinks    Comment: occ glass on wine  . Drug use: No  . Sexual activity: Never  Lifestyle  . Physical activity:    Days per week: Not on file    Minutes per session: Not on file  . Stress: Not on file  Relationships  . Social connections:    Talks on phone: Not on file    Gets together: Not on file    Attends religious service: Not on file    Active member of club or organization: Not on file    Attends meetings of clubs or organizations: Not on file    Relationship status: Not on file  . Intimate partner violence:    Fear of current or ex partner: Not on file    Emotionally abused: Not on file    Physically abused: Not on file    Forced sexual activity: Not on file  Other Topics Concern  . Not on file  Social History Narrative   Widow    limited exercise.     PHYSICAL EXAM  Vitals:   09/03/18 1436  BP: (!) 142/83  Pulse: 80  Weight: 221 lb 7.2 oz (100.4 kg)  Height: 5\' 5"  (1.651 m)   Body mass index is 36.85 kg/m.  Generalized: Well developed, morbidly obese female in no acute distress  Head: normocephalic and atraumatic,. Oropharynx benign [place] 2-3 Neck: Supple, circumference 15.5 Lungs clear Musculoskeletal: No deformity  Skin no rash or edema Neurological examination   Mentation: Alert oriented to time, place, history taking. Attention span and concentration appropriate. Recent and remote memory intact.  Follows all commands speech and language fluent.   Cranial nerve II-XII: Pupils were equal round reactive to light  extraocular movements were full, visual field were full on confrontational test. Facial sensation and strength were normal. hearing was intact to finger rubbing bilaterally. Uvula tongue midline. head turning and shoulder shrug were normal and symmetric.Tongue protrusion into cheek strength was normal. Motor: normal bulk and tone, full strength in the BUE, BLE,  Sensory: normal and symmetric to light touch,   Coordination: finger-nose-finger,  no dysmetria Gait and Station: Rising up from seated position without assistance, normal stance,  moderate stride, good arm swing, smooth turning, no assistive device DIAGNOSTIC DATA (LABS, IMAGING, TESTING) - I reviewed patient records, labs, notes, testing and imaging myself where available.  Lab Results  Component Value Date   WBC 10.7 08/29/2018   HGB 13.5 08/29/2018   HCT 41.3 08/29/2018   MCV 74.1 (L) 08/29/2018   PLT 414 (H) 08/29/2018      Component Value Date/Time   NA 139 03/28/2018 1149  K 4.5 03/28/2018 1149   CL 103 03/28/2018 1149   CO2 25 03/28/2018 1149   GLUCOSE 127 (H) 03/28/2018 1149   BUN 13 03/28/2018 1149   CREATININE 1.12 03/28/2018 1149   CALCIUM 9.3 03/28/2018 1149   PROT 7.1 03/28/2018 1149   ALBUMIN 3.9 03/28/2018 1149   AST 13 03/28/2018 1149   ALT 12 03/28/2018 1149   ALKPHOS 82 03/28/2018 1149   BILITOT 0.7 03/28/2018 1149   GFRNONAA 45 (L) 11/21/2016 1933   GFRAA 52 (L) 11/21/2016 1933   Lab Results  Component Value Date   CHOL 154 03/28/2018   HDL 52.90 03/28/2018   LDLCALC 82 03/28/2018   TRIG 94.0 03/28/2018   CHOLHDL 3 03/28/2018   Lab Results  Component Value Date   HGBA1C 6.3 03/28/2018   Lab Results  Component Value Date   [id number] 694 08/29/2018   Lab Results  Component Value Date   TSH 4.03 08/05/2018      ASSESSMENT AND PLAN  73 y.o. year old female  has a past medical history of Arthritis, Asthma, Chronic headache, Diabetes mellitus, DVT (deep venous thrombosis) ([facility]),  Dyspnea, Heart murmur, Hypertension, Pulmonary embolism ([place]), Pulmonary hypertension ([place]), and Seizures ([place]). here to follow-up for obstructive sleep apnea here for  CPAP compliance. Data dated 08/04/2018-09/02/2018 shows compliance greater than 4 hours at 87% for 26 days.  Average usage 5 hours 52 minutes.  Set pressure 5 to 10 cm.  EPR level 3.  AHI 2.5 ESS 3.  PLAN:CPAP compliance 87% greater than 4 hours Continue same settings Follow-up 6 months for repeat compliance then yearly Dennie Bible, [facility], [facility], [facility] [address], [place], [place] [address] [phone]

## 2018-09-03 ENCOUNTER — Encounter: Payer: Self-pay | Admitting: Nurse Practitioner

## 2018-09-03 ENCOUNTER — Ambulatory Visit (INDEPENDENT_AMBULATORY_CARE_PROVIDER_SITE_OTHER): Payer: Medicare Other | Admitting: Nurse Practitioner

## 2018-09-03 VITALS — BP 142/83 | HR 80 | Ht 65.0 in | Wt 221.4 lb

## 2018-09-03 DIAGNOSIS — G4733 Obstructive sleep apnea (adult) (pediatric): Secondary | ICD-10-CM

## 2018-09-03 DIAGNOSIS — M9903 Segmental and somatic dysfunction of lumbar region: Secondary | ICD-10-CM | POA: Diagnosis not present

## 2018-09-03 DIAGNOSIS — M5137 Other intervertebral disc degeneration, lumbosacral region: Secondary | ICD-10-CM | POA: Diagnosis not present

## 2018-09-03 DIAGNOSIS — M9905 Segmental and somatic dysfunction of pelvic region: Secondary | ICD-10-CM | POA: Diagnosis not present

## 2018-09-03 DIAGNOSIS — S29012A Strain of muscle and tendon of back wall of thorax, initial encounter: Secondary | ICD-10-CM | POA: Diagnosis not present

## 2018-09-03 DIAGNOSIS — Z9989 Dependence on other enabling machines and devices: Secondary | ICD-10-CM

## 2018-09-03 DIAGNOSIS — S39012A Strain of muscle, fascia and tendon of lower back, initial encounter: Secondary | ICD-10-CM | POA: Diagnosis not present

## 2018-09-03 DIAGNOSIS — M5136 Other intervertebral disc degeneration, lumbar region: Secondary | ICD-10-CM | POA: Diagnosis not present

## 2018-09-03 DIAGNOSIS — M9904 Segmental and somatic dysfunction of sacral region: Secondary | ICD-10-CM | POA: Diagnosis not present

## 2018-09-03 DIAGNOSIS — M9902 Segmental and somatic dysfunction of thoracic region: Secondary | ICD-10-CM | POA: Diagnosis not present

## 2018-09-03 NOTE — Patient Instructions (Signed)
CPAP compliance 87% greater than 4 hours Continue same settings Follow-up 6 months for repeat compliance then yearly

## 2018-09-08 DIAGNOSIS — J4550 Severe persistent asthma, uncomplicated: Secondary | ICD-10-CM | POA: Diagnosis not present

## 2018-09-09 ENCOUNTER — Ambulatory Visit: Payer: Self-pay

## 2018-09-09 ENCOUNTER — Ambulatory Visit (INDEPENDENT_AMBULATORY_CARE_PROVIDER_SITE_OTHER): Payer: Medicare Other | Admitting: *Deleted

## 2018-09-09 ENCOUNTER — Ambulatory Visit (INDEPENDENT_AMBULATORY_CARE_PROVIDER_SITE_OTHER): Payer: Medicare Other | Admitting: Allergy and Immunology

## 2018-09-09 VITALS — BP 142/84 | HR 80 | Resp 20

## 2018-09-09 DIAGNOSIS — M9902 Segmental and somatic dysfunction of thoracic region: Secondary | ICD-10-CM | POA: Diagnosis not present

## 2018-09-09 DIAGNOSIS — J4550 Severe persistent asthma, uncomplicated: Secondary | ICD-10-CM

## 2018-09-09 DIAGNOSIS — J3089 Other allergic rhinitis: Secondary | ICD-10-CM | POA: Diagnosis not present

## 2018-09-09 DIAGNOSIS — J0100 Acute maxillary sinusitis, unspecified: Secondary | ICD-10-CM | POA: Diagnosis not present

## 2018-09-09 DIAGNOSIS — M9905 Segmental and somatic dysfunction of pelvic region: Secondary | ICD-10-CM | POA: Diagnosis not present

## 2018-09-09 DIAGNOSIS — M9903 Segmental and somatic dysfunction of lumbar region: Secondary | ICD-10-CM | POA: Diagnosis not present

## 2018-09-09 DIAGNOSIS — M5136 Other intervertebral disc degeneration, lumbar region: Secondary | ICD-10-CM | POA: Diagnosis not present

## 2018-09-09 DIAGNOSIS — M9904 Segmental and somatic dysfunction of sacral region: Secondary | ICD-10-CM | POA: Diagnosis not present

## 2018-09-09 DIAGNOSIS — K219 Gastro-esophageal reflux disease without esophagitis: Secondary | ICD-10-CM

## 2018-09-09 DIAGNOSIS — M5137 Other intervertebral disc degeneration, lumbosacral region: Secondary | ICD-10-CM | POA: Diagnosis not present

## 2018-09-09 DIAGNOSIS — S29012A Strain of muscle and tendon of back wall of thorax, initial encounter: Secondary | ICD-10-CM | POA: Diagnosis not present

## 2018-09-09 DIAGNOSIS — S39012A Strain of muscle, fascia and tendon of lower back, initial encounter: Secondary | ICD-10-CM | POA: Diagnosis not present

## 2018-09-09 MED ORDER — ALBUTEROL SULFATE HFA 108 (90 BASE) MCG/ACT IN AERS: INHALATION_SPRAY | RESPIRATORY_TRACT | 0 refills | Status: DC

## 2018-09-09 MED ORDER — AZELASTINE HCL 0.1 % NA SOLN: NASAL | 5 refills | Status: DC

## 2018-09-09 NOTE — Patient Instructions (Addendum)
  1. Continue Advair 230 - 2 inhalations twice a day  2. Continue Spiriva 2.5 respimat - two inhalations one time per day.  3. Continue Montelukast 10mg  - one tablet one time per day.  4. Continue Xolair and Epi-Pen  5. Continue pantoprazole 40mg  in the AM and Ranitidine 300mg  in the PM  6. If needed:   A. Nasal saline several times per day  B. Mucinex DM two times per day  C. nasal azelastine two sprays each nostril 1-2 times per day  D. Ipratropium 0.06% -1-2 sprays each nostril 1-2 times per day to dry nose  E. Proventil HFA or albuterol nebulization  7. Add Flovent 220 - 2 inhalations 2 times a day during "FLARE UP"  8. Return to clinic in summer 2020 or earlier if there is a problem.

## 2018-09-09 NOTE — Progress Notes (Signed)
Follow-up Note  Referring Provider: [name], MD Primary Provider: [name], MD Date of Office Visit: 09/09/2018  Subjective:   [name] (DOB: [birth year]) is a 73 y.o. female who returns to the [facility] on 09/09/2018 in re-evaluation of the following:  HPI: [name] returns to this clinic in reevaluation of her asthma and allergic rhinitis and LPR and a history of pulmonary hypertension and diastolic dysfunction and sleep apnea.  I last saw her in this clinic on 22 April 2018.  Concerning her respiratory tract she has actually done very well.  She intermittently uses her short acting bronchodilator averaging out to 1 time per week.  She does not exercise because of multiple musculoskeletal issues.  It does not sound as though she has required a systemic steroid or antibiotic to treat her respiratory tract issue during the interval.  However, it should be noted that she self administers prednisone for her knee and back issue about 1 time per month usually for 1 day or possibly 2 days usually at 20 mg/day.  Her reflux is under pretty good control at this point on a proton pump inhibitor and an H2 receptor blocker.  She has had some "gas" and she has seen her GI doctor concerning this issue.  She continues on CPAP for her sleep apnea.  Allergies as of 09/09/2018      Reactions   Latex Hives   Penicillins Swelling   Has patient had a PCN reaction causing immediate rash, facial/tongue/throat swelling, SOB or lightheadedness with hypotension patient had a PCN reaction causing severe rash involving mucus membranes or skin necrosis: [id number] Has patient had a PCN reaction that required hospitalization/No Has patient had a PCN reaction occurring within the last 10 years: No If all of the above answers are "NO", then may proceed with Cephalosporin use.   Lyrica [pregabalin] Other (See Comments)   Blurred vision.   Phenytoin Sodium Extended Swelling   Swelling    Vicodin [hydrocodone-acetaminophen] Nausea And Vomiting      Medication List      acetaminophen-codeine 300-30 MG tablet Commonly known as:  TYLENOL #3 TAKE 1 TABLET BY MOUTH EVERY 12 HOURS AS NEEDED FOR UP TO 7 DAYS FOR MODERATE PAIN   albuterol 108 (90 Base) MCG/ACT inhaler Commonly known as:  PROVENTIL HFA;VENTOLIN HFA INHALE 2 PUFFS BY MOUTH INTO THE LUNGS EVERY 6 HOURS AS NEEDED FOR WHEEZING OR SHORTNESS OF BREATH   ALIGN 4 MG Caps Take 1 capsule by mouth daily.   APPLE CIDER VINEGAR PO Take 2 tablets by mouth 2 (two) times daily.   azelastine 0.1 % nasal spray Commonly known as:  ASTELIN USE 1-2 SPRAYS IN EACH NOSTRIL TWICE DAILY AS NEEDED   clobetasol cream 0.05 % Commonly known as:  TEMOVATE Apply 1 application topically 2 (two) times daily.   cyclobenzaprine 10 MG tablet Commonly known as:  FLEXERIL Take 1 tablet (10 mg total) by mouth at bedtime as needed for muscle spasms.   DAIRY-RELIEF PO Take 1 capsule by mouth daily as needed (stomach upset).   diltiazem 120 MG 24 hr capsule Commonly known as:  CARTIA XT Take 1 capsule (120 mg total) by mouth daily. KEEP OV.   DULoxetine 30 MG capsule Commonly known as:  CYMBALTA Take 1 capsule (30 mg total) by mouth daily.   EPINEPHrine 0.3 mg/0.3 mL Soaj injection Commonly known as:  EPI-PEN USE AS DIRECTED FOR LIFE THREATENING ALLERGIC REACTIONS   fluticasone  220 MCG/ACT inhaler Commonly known as:  FLOVENT HFA Inhale 2 puffs into the lungs 2 (two) times daily as needed.   fluticasone-salmeterol 230-21 MCG/ACT inhaler Commonly known as:  ADVAIR HFA Inhale two puffs twice daily to prevent cough or wheeze. Rinse mouth after use.   furosemide 40 MG tablet Commonly known as:  LASIX TAKE 1 TABLET(40 MG) BY MOUTH DAILY   gabapentin 300 MG capsule Commonly known as:  NEURONTIN TAKE 2 CAPSULES(600 MG) BY MOUTH THREE TIMES DAILY   GLYXAMBI 10-5 MG Tabs Generic drug:  Empagliflozin-linaGLIPtin Take  1 tablet by mouth daily.   hydrOXYzine 10 MG tablet Commonly known as:  ATARAX/VISTARIL TK 1 TO 2 TS PO HS   ipratropium 0.06 % nasal spray Commonly known as:  ATROVENT Place 1-2 sprays each nostril 1-2 times per day   irbesartan 150 MG tablet Commonly known as:  AVAPRO TAKE 1 TABLET(150 MG) BY MOUTH DAILY   levalbuterol 1.25 MG/3ML nebulizer solution Commonly known as:  XOPENEX USE 1 VIAL VIA NEBULIZER EVERY 4 HOURS AS NEEDED FOR WHEEZING   Magnesium 250 MG Tabs Take 1 tablet by mouth daily.   methimazole 5 MG tablet Commonly known as:  TAPAZOLE Take 5 mg in a.m. and 2.5 mg in p.m. by mouth, with meals   montelukast 10 MG tablet Commonly known as:  SINGULAIR TAKE 1 TABLET BY MOUTH EVERY DAY   ONETOUCH DELICA LANCETS 17O Misc as needed (blood check).   ONETOUCH VERIO test strip Generic drug:  glucose blood 1 each by Other route as needed (blood monitoring).   pantoprazole 40 MG tablet Commonly known as:  PROTONIX TAKE 1 TABLET BY MOUTH EVERY DAY   PATADAY 0.2 % Soln Generic drug:  Olopatadine HCl INT 1 GTT IN OU QD PRN   polyethylene glycol packet Commonly known as:  MIRALAX / GLYCOLAX Take 17 g by mouth daily.   Potassium 99 MG Tabs Take 99 mg by mouth daily.   ranitidine 300 MG tablet Commonly known as:  ZANTAC TAKE 1 TABLET BY MOUTH EVERY NIGHT AT BEDTIME   SPIRIVA RESPIMAT 1.25 MCG/ACT Aers Generic drug:  Tiotropium Bromide Monohydrate INHALE 2 PUFFS BY MOUTH EVERY DAY   SUMAtriptan 100 MG tablet Commonly known as:  IMITREX Take 100 mg x 1 may, repeat in 2 hour for migraine.   tolterodine 4 MG 24 hr capsule Commonly known as:  DETROL LA Take 4 mg by mouth daily.   Vitamin D3 1.25 MG (50000 UT) Caps Take 1 capsule by mouth every 7 (seven) days.   XARELTO 20 MG Tabs tablet Generic drug:  rivaroxaban TAKE 1 TABLET(20 MG) BY MOUTH DAILY WITH SUPPER       Past Medical History:  Diagnosis Date  . Allergic rhinitis   . Allergy   .  Arthritis   . Asthma   . Chronic headache   . Diabetes mellitus   . DVT (deep venous thrombosis) ([place])   . Dyspnea   . Heart murmur   . Hypertension   . Pulmonary embolism ([place])   . Pulmonary hypertension ([place])   . Seizures ([place])     Past Surgical History:  Procedure Laterality Date  . ABDOMINAL HYSTERECTOMY     partial, has ovaries  . CARDIAC CATHETERIZATION  12/28/2010   Mod. pulmonary hypertension, normal coronary arteries  . DOPPLER ECHOCARDIOGRAPHY  10/08/2011   EF=>55%,mild asymmetric LVH, mod. TR, mod. PH, mild to mod LA dilatation  . KNEE ARTHROSCOPY Left   . KNEE SURGERY    .  Nuclear Stress Test  05/20/2006   No ischemia  . PARTIAL HYSTERECTOMY    . PLANTAR FASCIA SURGERY    . TONSILLECTOMY      Review of systems negative except as noted in HPI / PMHx or noted below:  Review of Systems  Constitutional: Negative.   HENT: Negative.   Eyes: Negative.   Respiratory: Negative.   Cardiovascular: Negative.   Gastrointestinal: Negative.   Genitourinary: Negative.   Musculoskeletal: Negative.   Skin: Negative.   Neurological: Negative.   Endo/Heme/Allergies: Negative.   Psychiatric/Behavioral: Negative.      Objective:   Vitals:   09/09/18 1055  BP: (!) 142/84  Pulse: 80  Resp: 20          Physical Exam Constitutional:      Appearance: She is not diaphoretic.  HENT:     Head: Normocephalic.     Right Ear: Tympanic membrane, ear canal and external ear normal.     Left Ear: Tympanic membrane, ear canal and external ear normal.     Nose: Nose normal. No mucosal edema or rhinorrhea.     Mouth/Throat:     Pharynx: Uvula midline. No oropharyngeal exudate.  Eyes:     Conjunctiva/sclera: Conjunctivae normal.  Neck:     Thyroid: No thyromegaly.     Trachea: Trachea normal. No tracheal tenderness or tracheal deviation.  Cardiovascular:     Rate and Rhythm: Normal rate and regular rhythm.     Heart sounds: S1 normal and S2 normal. Murmur (Systolic)  present.  Pulmonary:     Effort: No respiratory distress.     Breath sounds: Normal breath sounds. No stridor. No wheezing or rales.  Lymphadenopathy:     Head:     Right side of head: No tonsillar adenopathy.     Left side of head: No tonsillar adenopathy.     Cervical: No cervical adenopathy.  Skin:    Findings: No erythema or rash.     Nails: There is no clubbing.   Neurological:     Mental Status: She is alert.     Diagnostics:    Spirometry was performed and demonstrated an FEV1 of 1.58 at 84 % of predicted.  The patient had an Asthma Control Test with the following results: ACT Total Score: 23.    Assessment and Plan:   1. Asthma, severe persistent, well-controlled   2. Other allergic rhinitis   3. LPRD (laryngopharyngeal reflux disease)   4. Acute maxillary sinusitis, recurrence not specified     1. Continue Advair 230 - 2 inhalations twice a day  2. Continue Spiriva 2.5 respimat - two inhalations one time per day.  3. Continue Montelukast 10mg  - one tablet one time per day.  4. Continue Xolair and Epi-Pen  5. Continue pantoprazole 40mg  in the AM and Ranitidine 300mg  in the PM  6. If needed:   A. Nasal saline several times per day  B. Mucinex DM two times per day  C. nasal azelastine two sprays each nostril 1-2 times per day  D. Ipratropium 0.06% -1-2 sprays each nostril 1-2 times per day to dry nose  E. Proventil HFA or albuterol nebulization  7. Add Flovent 220 - 2 inhalations 2 times a day during "FLARE UP"  8. Return to clinic in summer 2020 or earlier if there is a problem.  [name] appears to be doing relatively well regarding her respiratory track on her current plan which includes anti-inflammatory medications for her airway and treatment with omalizumab.  Of course, she is using systemic steroids just about every month at 20 to 40 mg per episode to treat her musculoskeletal issue.  Assuming she does well I will see her back in this clinic in the  summer 2020 or earlier if there is a problem.  Allena Katz, MD Allergy / Immunology [place]

## 2018-09-10 ENCOUNTER — Encounter: Payer: Self-pay | Admitting: Allergy and Immunology

## 2018-09-11 ENCOUNTER — Telehealth: Payer: Self-pay | Admitting: Family Medicine

## 2018-09-11 NOTE — Telephone Encounter (Signed)
Pt want to speak to nurse concerning her physical. Advise pt it needed to be 67yr and a day from previous year, but pt still want a call back.

## 2018-09-11 NOTE — Telephone Encounter (Signed)
Spoke with [name].  She states she does not have her Medicare Wellness scheduled for 2020.  Her last [place] was March 28, 2018 but she realized that her appointment for this year wasn't scheduled.  I advised I would have Robin call her to go ahead and get her [place] with Katha Cabal and CPE with Dr. Diona Browner scheduled in September 2020.

## 2018-09-12 NOTE — Telephone Encounter (Signed)
Medicare wellness 9/14 cpx 9/18

## 2018-09-12 NOTE — Telephone Encounter (Signed)
Left message asking pt to call office  °

## 2018-09-16 ENCOUNTER — Telehealth: Payer: Self-pay | Admitting: Internal Medicine

## 2018-09-16 MED ORDER — EMPAGLIFLOZIN-LINAGLIPTIN 10-5 MG PO TABS: 1.0000 | ORAL_TABLET | Freq: Every day | ORAL | 2 refills | Status: DC

## 2018-09-16 NOTE — Telephone Encounter (Signed)
RX sent

## 2018-09-16 NOTE — Telephone Encounter (Signed)
MEDICATION: Glyxambi 10 MG - 5 MG  PHARMACY:  [facility] in [place] on [address] : unknown  IS PATIENT OUT OF MEDICATION: NO  IF NOT; HOW MUCH IS LEFT: 5 days worth  LAST APPOINTMENT DATE: @1 /22/2020  NEXT APPOINTMENT DATE:@3 /23/2020  DO WE HAVE YOUR PERMISSION TO LEAVE A DETAILED MESSAGE: YES  OTHER COMMENTS:    **Let patient know to contact pharmacy at the end of the day to make sure medication is ready. **  ** Please notify patient to allow 48-72 hours to process**  **Encourage patient to contact the pharmacy for refills or they can request refills through [facility]**

## 2018-09-18 DIAGNOSIS — M5136 Other intervertebral disc degeneration, lumbar region: Secondary | ICD-10-CM | POA: Diagnosis not present

## 2018-09-18 DIAGNOSIS — M9902 Segmental and somatic dysfunction of thoracic region: Secondary | ICD-10-CM | POA: Diagnosis not present

## 2018-09-18 DIAGNOSIS — M9903 Segmental and somatic dysfunction of lumbar region: Secondary | ICD-10-CM | POA: Diagnosis not present

## 2018-09-18 DIAGNOSIS — S39012A Strain of muscle, fascia and tendon of lower back, initial encounter: Secondary | ICD-10-CM | POA: Diagnosis not present

## 2018-09-18 DIAGNOSIS — M9904 Segmental and somatic dysfunction of sacral region: Secondary | ICD-10-CM | POA: Diagnosis not present

## 2018-09-18 DIAGNOSIS — M5137 Other intervertebral disc degeneration, lumbosacral region: Secondary | ICD-10-CM | POA: Diagnosis not present

## 2018-09-18 DIAGNOSIS — M9905 Segmental and somatic dysfunction of pelvic region: Secondary | ICD-10-CM | POA: Diagnosis not present

## 2018-09-18 DIAGNOSIS — S29012A Strain of muscle and tendon of back wall of thorax, initial encounter: Secondary | ICD-10-CM | POA: Diagnosis not present

## 2018-09-23 DIAGNOSIS — R350 Frequency of micturition: Secondary | ICD-10-CM | POA: Diagnosis not present

## 2018-09-23 DIAGNOSIS — N3941 Urge incontinence: Secondary | ICD-10-CM | POA: Diagnosis not present

## 2018-09-29 DIAGNOSIS — M1711 Unilateral primary osteoarthritis, right knee: Secondary | ICD-10-CM | POA: Diagnosis not present

## 2018-09-29 DIAGNOSIS — M5136 Other intervertebral disc degeneration, lumbar region: Secondary | ICD-10-CM | POA: Diagnosis not present

## 2018-09-29 DIAGNOSIS — M542 Cervicalgia: Secondary | ICD-10-CM | POA: Diagnosis not present

## 2018-10-06 ENCOUNTER — Ambulatory Visit (INDEPENDENT_AMBULATORY_CARE_PROVIDER_SITE_OTHER): Payer: Medicare Other | Admitting: Internal Medicine

## 2018-10-06 ENCOUNTER — Other Ambulatory Visit: Payer: Self-pay

## 2018-10-06 ENCOUNTER — Encounter: Payer: Self-pay | Admitting: Internal Medicine

## 2018-10-06 VITALS — BP 138/98 | HR 88 | Ht 65.0 in

## 2018-10-06 DIAGNOSIS — E1140 Type 2 diabetes mellitus with diabetic neuropathy, unspecified: Secondary | ICD-10-CM | POA: Diagnosis not present

## 2018-10-06 DIAGNOSIS — E0500 Thyrotoxicosis with diffuse goiter without thyrotoxic crisis or storm: Secondary | ICD-10-CM

## 2018-10-06 DIAGNOSIS — E041 Nontoxic single thyroid nodule: Secondary | ICD-10-CM

## 2018-10-06 DIAGNOSIS — J4550 Severe persistent asthma, uncomplicated: Secondary | ICD-10-CM

## 2018-10-06 LAB — T4, FREE: Free T4: 0.75 ng/dL (ref 0.60–1.60)

## 2018-10-06 LAB — TSH: TSH: 4.3 u[IU]/mL (ref 0.35–4.50)

## 2018-10-06 LAB — T3, FREE: T3, Free: 3.1 pg/mL (ref 2.3–4.2)

## 2018-10-06 MED ORDER — METHIMAZOLE 5 MG PO TABS: ORAL_TABLET | ORAL | 5 refills | Status: DC

## 2018-10-06 NOTE — Progress Notes (Signed)
Patient ID: [name], female   DOB: 04-27-1946, 73 y.o.   MRN: [id number]    HPI  [name] is a 73 y.o.-year-old female, returning for f/u for for thyrotoxicosis and thyroid nodule.  She previously saw another endocrinologist (Dr. Wilson Singer), but only for her diabetes. Last OV with me 4 mo ago.  She started Prednisone for neck pain   -started this 1 week ago - has 5 more days. Did not take it this morning.  Reviewed history: Patient was found to be thyrotoxic in 08/2015 but she reports that she was only informed about this in 2018.  Thyroid scan indicated possible Graves' disease and also called left thyroid nodule.  This was biopsied with benign results in 11/2017.  After her Graves' diagnosis, she was started on methimazole and dose was adjusted with subsequent measurements.. she continues on diltiazem CD.  TSI's were also elevated then.  She is feeling better on methimazole, less fatigued.  As of now, she is on 5 mg methimazole daily.  No side effects.  Reviewed her TFTs: Lab Results  Component Value Date   TSH 4.03 08/05/2018   TSH 6.04 (H) 06/05/2018   TSH <0.01 (L) 02/06/2018   TSH <0.01 (L) 09/27/2017   TSH 0.01 (L) 02/05/2017   TSH 0.07 (L) 03/22/2016   TSH 0.06 (L) 12/09/2015   TSH 0.03 Repeated and verified X2. (L) 08/23/2015   TSH 0.59 04/05/2015   TSH 0.467 12/27/2010   FREET4 0.88 08/05/2018   FREET4 0.59 (L) 06/05/2018   FREET4 1.89 (H) 02/06/2018   FREET4 1.94 (H) 09/27/2017   FREET4 1.19 02/05/2017   FREET4 1.91 (H) 03/22/2016   FREET4 1.45 12/09/2015   FREET4 1.84 Repeated and verified X2. (H) 08/23/2015   T3FREE 2.9 08/05/2018   T3FREE 2.6 06/05/2018   T3FREE 3.9 02/06/2018   T3FREE 3.4 09/27/2017   T3FREE 3.5 02/05/2017   T3FREE 3.8 03/22/2016   T3FREE 3.9 12/09/2015   Thyroid uptake and scan (10/31/2017): Increased uptake of 40%, and cold left inferior pole nodule.  Thyroid U/S (11/12/2017): 3.2 cm left inferior TR 3 nodule correlates with the  cold nodule by nuclear medicine scan. This nodule meets criteria for biopsy as above.  FNA of her left thyroid nodule (12/04/2017): Benign  Her TSI antibodies are high: Lab Results  Component Value Date   TSI 361 (H) 02/06/2018   Pt denies: - feeling nodules in neck - hoarseness - dysphagia - choking - SOB with lying down  Pt does not have a FH of thyroid ds.  No family history of thyroid cancer.  No history of radiation treatment to head or neck.  No seaweed or kelp.  No recent contrast studies.  No herbal supplements.  No biotin use.  + steroid use, but she had these in the past.  Pt. also has a history of type 2 diabetes, asthma, PE, urinary urgency.  ROS: Constitutional: no weight gain/no weight loss, no fatigue, no subjective hyperthermia, no subjective hypothermia Eyes: no blurry vision, no xerophthalmia ENT: no sore throat, + see HPI Cardiovascular: no CP/no SOB/no palpitations/no leg swelling Respiratory: no cough/no SOB/no wheezing Gastrointestinal: no N/no V/no D/no C/no acid reflux Musculoskeletal: no muscle aches/+ joint aches Skin: no rashes, no hair loss Neurological: no tremors/no numbness/no tingling/no dizziness  I reviewed pt's medications, allergies, PMH, social hx, family hx, and changes were documented in the history of present illness. Otherwise, unchanged from my initial visit note.  Past Medical History:  Diagnosis Date  .  Allergic rhinitis   . Allergy   . Arthritis   . Asthma   . Chronic headache   . Diabetes mellitus   . DVT (deep venous thrombosis) ([place])   . Dyspnea   . Heart murmur   . Hypertension   . Pulmonary embolism ([place])   . Pulmonary hypertension ([place])   . Seizures ([place])    Past Surgical History:  Procedure Laterality Date  . ABDOMINAL HYSTERECTOMY     partial, has ovaries  . CARDIAC CATHETERIZATION  12/28/2010   Mod. pulmonary hypertension, normal coronary arteries  . DOPPLER ECHOCARDIOGRAPHY  10/08/2011   EF=>55%,mild  asymmetric LVH, mod. TR, mod. PH, mild to mod LA dilatation  . KNEE ARTHROSCOPY Left   . KNEE SURGERY    . Nuclear Stress Test  05/20/2006   No ischemia  . PARTIAL HYSTERECTOMY    . PLANTAR FASCIA SURGERY    . TONSILLECTOMY     Social History   Socioeconomic History  . Marital status: Widowed    Spouse name: Not on file  . Number of children: Y  . Years of education: Not on file  . Highest education level: Not on file  Occupational History  . Occupation: retired Geologist, engineering.   Social Needs  . Financial resource strain: Not on file  . Food insecurity:    Worry: Not on file    Inability: Not on file  . Transportation needs:    Medical: Not on file    Non-medical: Not on file  Tobacco Use  . Smoking status: Never Smoker  . Smokeless tobacco: Never Used  Substance and Sexual Activity  . Alcohol use: Yes    Alcohol/week: 0.0 standard drinks    Comment: occ glass on wine  . Drug use: No  . Sexual activity: Never  Lifestyle  . Physical activity:    Days per week: Not on file    Minutes per session: Not on file  . Stress: Not on file  Relationships  . Social connections:    Talks on phone: Not on file    Gets together: Not on file    Attends religious service: Not on file    Active member of club or organization: Not on file    Attends meetings of clubs or organizations: Not on file    Relationship status: Not on file  . Intimate partner violence:    Fear of current or ex partner: Not on file    Emotionally abused: Not on file    Physically abused: Not on file    Forced sexual activity: Not on file  Other Topics Concern  . Not on file  Social History Narrative   Widow    limited exercise.   Current Outpatient Medications on File Prior to Visit  Medication Sig Dispense Refill  . acetaminophen-codeine (TYLENOL #3) 300-30 MG tablet TAKE 1 TABLET BY MOUTH EVERY 12 HOURS AS NEEDED FOR UP TO 7 DAYS FOR MODERATE PAIN 60 tablet 0  . albuterol (PROVENTIL HFA;VENTOLIN  HFA) 108 (90 Base) MCG/ACT inhaler INHALE 2 PUFFS BY MOUTH INTO THE LUNGS EVERY 6 HOURS AS NEEDED FOR WHEEZING OR SHORTNESS OF BREATH 54 g 0  . APPLE CIDER VINEGAR PO Take 2 tablets by mouth 2 (two) times daily.    Marland Kitchen azelastine (ASTELIN) 0.1 % nasal spray USE 1-2 SPRAYS IN EACH NOSTRIL TWICE DAILY AS NEEDED 30 mL 5  . Cholecalciferol (VITAMIN D3) 1.25 MG (50000 UT) CAPS Take 1 capsule by mouth every 7 (seven)  days. 12 capsule 0  . clobetasol cream (TEMOVATE) 1.88 % Apply 1 application topically 2 (two) times daily.    . cyclobenzaprine (FLEXERIL) 10 MG tablet Take 1 tablet (10 mg total) by mouth at bedtime as needed for muscle spasms. 30 tablet 1  . diltiazem (CARTIA XT) 120 MG 24 hr capsule Take 1 capsule (120 mg total) by mouth daily. KEEP OV. 30 capsule 2  . DULoxetine (CYMBALTA) 30 MG capsule Take 1 capsule (30 mg total) by mouth daily. 30 capsule 5  . Empagliflozin-linaGLIPtin (GLYXAMBI) 10-5 MG TABS Take 1 tablet by mouth daily. 30 tablet 2  . EPINEPHrine 0.3 mg/0.3 mL IJ SOAJ injection USE AS DIRECTED FOR LIFE THREATENING ALLERGIC REACTIONS 2 Device 2  . fluticasone (FLOVENT HFA) 220 MCG/ACT inhaler Inhale 2 puffs into the lungs 2 (two) times daily as needed. 1 Inhaler 5  . fluticasone-salmeterol (ADVAIR HFA) 230-21 MCG/ACT inhaler Inhale two puffs twice daily to prevent cough or wheeze. Rinse mouth after use. 12 g 3  . furosemide (LASIX) 40 MG tablet TAKE 1 TABLET(40 MG) BY MOUTH DAILY 30 tablet 3  . gabapentin (NEURONTIN) 300 MG capsule TAKE 2 CAPSULES(600 MG) BY MOUTH THREE TIMES DAILY 180 capsule 1  . hydrOXYzine (ATARAX/VISTARIL) 10 MG tablet TK 1 TO 2 TS PO HS  1  . ipratropium (ATROVENT) 0.06 % nasal spray Place 1-2 sprays each nostril 1-2 times per day 15 mL 5  . irbesartan (AVAPRO) 150 MG tablet TAKE 1 TABLET(150 MG) BY MOUTH DAILY 30 tablet 9  . Lactase (DAIRY-RELIEF PO) Take 1 capsule by mouth daily as needed (stomach upset).     Marland Kitchen levalbuterol (XOPENEX) 1.25 MG/3ML nebulizer  solution USE 1 VIAL VIA NEBULIZER EVERY 4 HOURS AS NEEDED FOR WHEEZING 1650 mL 0  . Magnesium 250 MG TABS Take 1 tablet by mouth daily.    . methimazole (TAPAZOLE) 5 MG tablet Take 5 mg in a.m. and 2.5 mg in p.m. by mouth, with meals 60 tablet 5  . montelukast (SINGULAIR) 10 MG tablet TAKE 1 TABLET BY MOUTH EVERY DAY 90 tablet 1  . ONETOUCH DELICA LANCETS 41Y MISC as needed (blood check).     [name] VERIO test strip 1 each by Other route as needed (blood monitoring).     . pantoprazole (PROTONIX) 40 MG tablet TAKE 1 TABLET BY MOUTH EVERY DAY 90 tablet 1  . PATADAY 0.2 % SOLN INT 1 GTT IN OU QD PRN  4  . polyethylene glycol (MIRALAX / GLYCOLAX) packet Take 17 g by mouth daily.    . Potassium 99 MG TABS Take 99 mg by mouth daily.    . Probiotic Product (ALIGN) 4 MG CAPS Take 1 capsule by mouth daily.    . ranitidine (ZANTAC) 300 MG tablet TAKE 1 TABLET BY MOUTH EVERY NIGHT AT BEDTIME 90 tablet 1  . SPIRIVA RESPIMAT 1.25 MCG/ACT AERS INHALE 2 PUFFS BY MOUTH EVERY DAY 4 g 5  . SUMAtriptan (IMITREX) 100 MG tablet Take 100 mg x 1 may, repeat in 2 hour for migraine. 10 tablet 0  . tolterodine (DETROL LA) 4 MG 24 hr capsule Take 4 mg by mouth daily.    Alveda Reasons 20 MG TABS tablet TAKE 1 TABLET(20 MG) BY MOUTH DAILY WITH SUPPER 90 tablet 1   Current Facility-Administered Medications on File Prior to Visit  Medication Dose Route Frequency Provider Last Rate Last Dose  . omalizumab Arvid Right) injection 300 mg  300 mg Subcutaneous Q28 days Kozlow, Donnamarie Poag,  MD   300 mg at 09/09/18 1849   Allergies  Allergen Reactions  . Latex Hives  . Penicillins Swelling    Has patient had a PCN reaction causing immediate rash, facial/tongue/throat swelling, SOB or lightheadedness with hypotension patient had a PCN reaction causing severe rash involving mucus membranes or skin necrosis: [id number] Has patient had a PCN reaction that required hospitalization/No Has patient had a PCN reaction occurring within the  last 10 years: No If all of the above answers are "NO", then may proceed with Cephalosporin use.     Recardo Evangelist [Pregabalin] Other (See Comments)    Blurred vision.  Marland Kitchen Phenytoin Sodium Extended Swelling    Swelling   . Vicodin [Hydrocodone-Acetaminophen] Nausea And Vomiting   Family History  Problem Relation Age of Onset  . Hypertension Mother   . Clotting disorder Mother   . Breast cancer Mother   . Arthritis Mother   . Stroke Mother   . Diabetes Mother   . Cancer Brother   . Alcohol abuse Father   . Arthritis Sister   . Diabetes Sister   . Multiple sclerosis Sister   . Allergies Other        grandson    PE: Ht 5\' 5"  (1.651 m)   BMI 36.85 kg/m Weight 223, pulse 88, blood pressure 138/98, oxygen saturation 99% Wt Readings from Last 3 Encounters:  09/03/18 221 lb 7.2 oz (100.4 kg)  08/29/18 218 lb 12 oz (99.2 kg)  06/05/18 224 lb (101.6 kg)   Constitutional: overweight, in NAD Eyes: PERRLA, EOMI, no exophthalmos ENT: moist mucous membranes, no thyromegaly, no cervical lymphadenopathy Cardiovascular: RRR, No MRG Respiratory: CTA B Gastrointestinal: abdomen soft, NT, ND, BS+ Musculoskeletal: no deformities, strength intact in all 4 Skin: moist, warm, no rashes Neurological: no tremor with outstretched hands, DTR normal in all 4  ASSESSMENT: 1.  Graves' disease  2.  Left thyroid nodule  3. DM2  PLAN:  1. Patient with 2.5-year history of thyrotoxic TFTs, with thyrotoxic symptoms: Fatigue, heat intolerance, insomnia, but no hyper defecation, palpitations, anxiety.  She had a thyroid uptake and scan which pointed towards Graves' disease.  TSI antibodies confirmed the diagnosis.  She was started on methimazole, which was increased to 10 mg and 5 mg in p.m., however, afterwards, we were able to reduce the dose to 5 mg in a.m. and 2.5 mg in the afternoon at last visit and to 5 mg in a.m. and 07/2018.  She also continues Cardizem CD, which is chronic treatment for her. -She  started to feel better after starting methimazole -We discussed about possible modalities of treatment for Graves' disease, to include methimazole use, RAI treatment, or, last result, surgery.  As she is responding well to methimazole, will continue this for now.  She is aware of possible side effects from methimazole -We reviewed together her previous TFTs, which were normal in 07/2018.  At that time, we decrease the methimazole dose further pending her lab results at this visit -We will check TSH, free T4, free T3 -I will see her back in 6 months  2.  Left thyroid nodule -This appears to be called on her thyroid uptake and scan test -She had a benign FNA 11/2017 -No neck compression symptoms -We will continue to follow the nodule expectantly for now  3. DM2 -Managed by PCP -Appears well-controlled: Lab Results  Component Value Date   HGBA1C 6.3 03/28/2018   HGBA1C 5.9 03/25/2017   HGBA1C 6.2 08/14/2016   Needs  refills MMI.  Component     Latest Ref Rng & Units 10/06/2018  TSH     0.35 - 4.50 uIU/mL 4.30  T4,Free(Direct)     0.60 - 1.60 ng/dL 0.75  Triiodothyronine,Free,Serum     2.3 - 4.2 pg/mL 3.1   Thyroid test are normal and TSH is close to the upper limit of normal.  We will reduce methimazole to 2.5 mg daily and repeat the labs in 1.5 months.  Philemon Kingdom, MD PhD [facility] Endocrinology

## 2018-10-06 NOTE — Patient Instructions (Signed)
Please continue: - Methimazole 5 mg daily with a meal  Please stop at the lab.  Please come back for a follow-up appointment in 6 months.

## 2018-10-07 ENCOUNTER — Ambulatory Visit (INDEPENDENT_AMBULATORY_CARE_PROVIDER_SITE_OTHER): Payer: Medicare Other | Admitting: *Deleted

## 2018-10-07 ENCOUNTER — Other Ambulatory Visit: Payer: Self-pay

## 2018-10-07 ENCOUNTER — Telehealth: Payer: Self-pay

## 2018-10-07 DIAGNOSIS — R350 Frequency of micturition: Secondary | ICD-10-CM | POA: Diagnosis not present

## 2018-10-07 DIAGNOSIS — J4550 Severe persistent asthma, uncomplicated: Secondary | ICD-10-CM

## 2018-10-07 DIAGNOSIS — N3941 Urge incontinence: Secondary | ICD-10-CM | POA: Diagnosis not present

## 2018-10-07 NOTE — Telephone Encounter (Signed)
-----   Message from Philemon Kingdom, MD sent at 10/06/2018  5:06 PM EDT ----- Lenna Sciara, can you please call pt: Thyroid test are normal, but TSH is at the upper limit of normal, so we can reduce the methimazole to 2.5 mg daily.  We need labs in 1.5 months.  Labs are in.

## 2018-10-08 ENCOUNTER — Telehealth: Payer: Self-pay | Admitting: Allergy and Immunology

## 2018-10-08 NOTE — Telephone Encounter (Signed)
She wants Dr. Bruna Potter opinion on if she needs to be quarantined. She has no symptoms and feels fine, except her back hurts. She is worried because she has diabetes and asthma.

## 2018-10-08 NOTE — Telephone Encounter (Signed)
Patient was concerned about her up coming appointments to the dentist and for her mammogram.  Advised patient that as long as her dentist appointment was for a routine cleaning, that it would probably be best to wait a few months to have this done.  Advised patient that if anything changed and she developed abscesses or was having any tooth or mouth pain to be seen.  Also advised patient that as long as she has never had a history of cancer or any abnormal mammograms, that it was in her best interest to put this off for a few months as well.  Patient stated that she has always had normal mammograms.  Advised patient, to call any office prior to arriving due to closures and a lot of offices changing to telemedicine.  I did advise patient that due to her underlying medical conditions, to monitor symptoms and call with questions or concerns.  She voiced understanding and it going to limit her contact with people as much as possible for the foreseeable future.

## 2018-10-08 NOTE — Telephone Encounter (Signed)
Notified patient of message from Dr. Gherghe, patient expressed understanding and agreement. No further questions.  

## 2018-10-14 DIAGNOSIS — R350 Frequency of micturition: Secondary | ICD-10-CM | POA: Diagnosis not present

## 2018-10-14 DIAGNOSIS — N3941 Urge incontinence: Secondary | ICD-10-CM | POA: Diagnosis not present

## 2018-10-18 ENCOUNTER — Other Ambulatory Visit: Payer: Self-pay | Admitting: Family Medicine

## 2018-10-20 NOTE — Telephone Encounter (Signed)
Last office visit 08/29/2018 for Fatigue.  Last refilled 08/07/2018 for #180 with 1 refill.  Next Appt: 04/03/2019 for CPE.

## 2018-10-28 DIAGNOSIS — N3941 Urge incontinence: Secondary | ICD-10-CM | POA: Diagnosis not present

## 2018-10-28 DIAGNOSIS — R350 Frequency of micturition: Secondary | ICD-10-CM | POA: Diagnosis not present

## 2018-10-31 ENCOUNTER — Other Ambulatory Visit: Payer: Self-pay | Admitting: *Deleted

## 2018-11-03 DIAGNOSIS — J4550 Severe persistent asthma, uncomplicated: Secondary | ICD-10-CM | POA: Diagnosis not present

## 2018-11-04 ENCOUNTER — Ambulatory Visit (INDEPENDENT_AMBULATORY_CARE_PROVIDER_SITE_OTHER): Payer: Medicare Other | Admitting: *Deleted

## 2018-11-04 ENCOUNTER — Other Ambulatory Visit: Payer: Self-pay

## 2018-11-04 DIAGNOSIS — R350 Frequency of micturition: Secondary | ICD-10-CM | POA: Diagnosis not present

## 2018-11-04 DIAGNOSIS — J4550 Severe persistent asthma, uncomplicated: Secondary | ICD-10-CM | POA: Diagnosis not present

## 2018-11-04 DIAGNOSIS — N3941 Urge incontinence: Secondary | ICD-10-CM | POA: Diagnosis not present

## 2018-11-05 ENCOUNTER — Telehealth: Payer: Self-pay | Admitting: Allergy and Immunology

## 2018-11-05 MED ORDER — FAMOTIDINE 20 MG PO TABS: 20.0000 mg | ORAL_TABLET | Freq: Two times a day (BID) | ORAL | 5 refills | Status: DC

## 2018-11-05 NOTE — Telephone Encounter (Signed)
Patient states that she has some questions for the doctor about her ranitidine

## 2018-11-05 NOTE — Telephone Encounter (Signed)
Spoke with patient and advised of standing orders to change to Famotidine 20mg  bid.  She is voiced understanding and is aware that Ranitidine can no longer be prescribed.

## 2018-11-11 DIAGNOSIS — M9903 Segmental and somatic dysfunction of lumbar region: Secondary | ICD-10-CM | POA: Diagnosis not present

## 2018-11-11 DIAGNOSIS — S39012A Strain of muscle, fascia and tendon of lower back, initial encounter: Secondary | ICD-10-CM | POA: Diagnosis not present

## 2018-11-11 DIAGNOSIS — M9902 Segmental and somatic dysfunction of thoracic region: Secondary | ICD-10-CM | POA: Diagnosis not present

## 2018-11-11 DIAGNOSIS — S29012A Strain of muscle and tendon of back wall of thorax, initial encounter: Secondary | ICD-10-CM | POA: Diagnosis not present

## 2018-11-11 DIAGNOSIS — M9904 Segmental and somatic dysfunction of sacral region: Secondary | ICD-10-CM | POA: Diagnosis not present

## 2018-11-11 DIAGNOSIS — M5136 Other intervertebral disc degeneration, lumbar region: Secondary | ICD-10-CM | POA: Diagnosis not present

## 2018-11-11 DIAGNOSIS — M9905 Segmental and somatic dysfunction of pelvic region: Secondary | ICD-10-CM | POA: Diagnosis not present

## 2018-11-11 DIAGNOSIS — M5137 Other intervertebral disc degeneration, lumbosacral region: Secondary | ICD-10-CM | POA: Diagnosis not present

## 2018-11-11 DIAGNOSIS — N3941 Urge incontinence: Secondary | ICD-10-CM | POA: Diagnosis not present

## 2018-11-11 DIAGNOSIS — R350 Frequency of micturition: Secondary | ICD-10-CM | POA: Diagnosis not present

## 2018-11-12 ENCOUNTER — Telehealth: Payer: Self-pay | Admitting: Allergy and Immunology

## 2018-11-12 NOTE — Telephone Encounter (Signed)
Patient wants to speak to Dr Neldon Mc about her nebulizer and wheezing Patient does NOT want to speak to the nurse about this

## 2018-11-12 NOTE — Telephone Encounter (Signed)
Please provide patient a neb compressor and neb set up.

## 2018-11-13 NOTE — Telephone Encounter (Signed)
Patient was notified and will pick up the nebulizer with set is ready for patient to pick up at the front office. Patient is planning on picking up on 11/14/2018

## 2018-11-14 ENCOUNTER — Other Ambulatory Visit (INDEPENDENT_AMBULATORY_CARE_PROVIDER_SITE_OTHER): Payer: Medicare Other

## 2018-11-14 ENCOUNTER — Telehealth: Payer: Self-pay

## 2018-11-14 ENCOUNTER — Encounter: Payer: Self-pay | Admitting: Family Medicine

## 2018-11-14 ENCOUNTER — Ambulatory Visit (INDEPENDENT_AMBULATORY_CARE_PROVIDER_SITE_OTHER): Payer: Medicare Other | Admitting: Family Medicine

## 2018-11-14 VITALS — Ht 65.0 in | Wt 222.4 lb

## 2018-11-14 DIAGNOSIS — M79672 Pain in left foot: Secondary | ICD-10-CM

## 2018-11-14 NOTE — Telephone Encounter (Signed)
Will start with virtual OV and provide referral as appropriate.

## 2018-11-14 NOTE — Telephone Encounter (Signed)
I spoke with pt; pt has pain in middle of lt foot from the top of the foot to the bottom of the foot on and off. No known injury. Pt does have neuropathy. Pt is not having pain now. Pt said can walk OK. No fever,cough or SOB. No travel and no known exposure to + covid or flu. Pt requesting referral.pt as virtual visit with Dr Diona Browner 11/14/18 at 11:40.

## 2018-11-14 NOTE — Addendum Note (Signed)
Addended by: Ellamae Sia on: 11/14/2018 02:27 PM   Modules accepted: Orders

## 2018-11-14 NOTE — Progress Notes (Signed)
VIRTUAL VISIT Due to national recommendations of social distancing due to [place] 19, a virtual visit is felt to be most appropriate for this patient at this time.   I connected with the patient on 11/14/18 at 11:40 AM EDT by virtual telehealth platform and verified that I am speaking with the correct person using two identifiers. Interactive audio and video telecommunications were attempted between this provider and patient, however failed, due to patient having technical difficulties OR patient did not have access to video capability.  We continued and completed visit with audio only.      I discussed the limitations, risks, security and privacy concerns of performing an evaluation and management service by  virtual telehealth platform and the availability of in person appointments. I also discussed with the patient that there may be a patient responsible charge related to this service. The patient expressed understanding and agreed to proceed.  Patient location: Home Provider Location: [place] Participants: [name] and [name]   Chief Complaint  Patient presents with  . Foot Pain    Left     History of Present Illness: 73 year old female with diabetes, hx of DVT, neuropathy from DM, paroxsymal` afib presents with new onset pain in left foot. Off and on for months. Pain in dorsal foot from toe towards ankle.  No redness, mild intermittent swelling, none now. No heat  Feels tight.  Able to walk but some pain with weight bearing.  Occ great toe pain, no toe joint pain Pain severe at times..painful to have sheet touch area.   She denies fall, no known injury.    numbness in back of foot.  no weakness.   no history of gout.  COVID 19 screen No recent travel or known exposure to COVID19 The patient denies respiratory symptoms of COVID 19 at this time.  The importance of social distancing was discussed today.   ROS    Past Medical History:  Diagnosis  Date  . Allergic rhinitis   . Allergy   . Arthritis   . Asthma   . Chronic headache   . Diabetes mellitus   . DVT (deep venous thrombosis) ([place])   . Dyspnea   . Heart murmur   . Hypertension   . Pulmonary embolism ([place])   . Pulmonary hypertension ([place])   . Seizures ([place])     reports that she has never smoked. She has never used smokeless tobacco. She reports current alcohol use. She reports that she does not use drugs.   Current Outpatient Medications:  .  acetaminophen-codeine (TYLENOL #3) 300-30 MG tablet, TAKE 1 TABLET BY MOUTH EVERY 12 HOURS AS NEEDED FOR UP TO 7 DAYS FOR MODERATE PAIN, Disp: 60 tablet, Rfl: 0 .  albuterol (PROVENTIL HFA;VENTOLIN HFA) 108 (90 Base) MCG/ACT inhaler, INHALE 2 PUFFS BY MOUTH INTO THE LUNGS EVERY 6 HOURS AS NEEDED FOR WHEEZING OR SHORTNESS OF BREATH, Disp: 54 g, Rfl: 0 .  APPLE CIDER VINEGAR PO, Take 2 tablets by mouth 2 (two) times daily., Disp: , Rfl:  .  azelastine (ASTELIN) 0.1 % nasal spray, USE 1-2 SPRAYS IN EACH NOSTRIL TWICE DAILY AS NEEDED, Disp: 30 mL, Rfl: 5 .  Cholecalciferol (VITAMIN D3) 1.25 MG (50000 UT) CAPS, Take 1 capsule by mouth every 7 (seven) days., Disp: 12 capsule, Rfl: 0 .  clobetasol cream (TEMOVATE) 6.04 %, Apply 1 application topically 2 (two) times daily., Disp: , Rfl:  .  cyclobenzaprine (FLEXERIL) 10 MG tablet, Take 1  tablet (10 mg total) by mouth at bedtime as needed for muscle spasms., Disp: 30 tablet, Rfl: 1 .  diltiazem (CARTIA XT) 120 MG 24 hr capsule, Take 1 capsule (120 mg total) by mouth daily. KEEP OV., Disp: 30 capsule, Rfl: 2 .  DULoxetine (CYMBALTA) 30 MG capsule, TAKE 1 CAPSULE(30 MG) BY MOUTH DAILY, Disp: 30 capsule, Rfl: 5 .  Empagliflozin-linaGLIPtin (GLYXAMBI) 10-5 MG TABS, Take 1 tablet by mouth daily., Disp: 30 tablet, Rfl: 2 .  EPINEPHrine 0.3 mg/0.3 mL IJ SOAJ injection, USE AS DIRECTED FOR LIFE THREATENING ALLERGIC REACTIONS, Disp: 2 Device, Rfl: 2 .  famotidine (PEPCID) 20 MG tablet, Take 1 tablet (20  mg total) by mouth 2 (two) times daily., Disp: 60 tablet, Rfl: 5 .  fluticasone (FLOVENT HFA) 220 MCG/ACT inhaler, Inhale 2 puffs into the lungs 2 (two) times daily as needed., Disp: 1 Inhaler, Rfl: 5 .  fluticasone-salmeterol (ADVAIR HFA) 230-21 MCG/ACT inhaler, Inhale two puffs twice daily to prevent cough or wheeze. Rinse mouth after use., Disp: 12 g, Rfl: 3 .  furosemide (LASIX) 40 MG tablet, TAKE 1 TABLET(40 MG) BY MOUTH DAILY, Disp: 30 tablet, Rfl: 3 .  gabapentin (NEURONTIN) 300 MG capsule, TAKE 2 CAPSULES(600 MG) BY MOUTH THREE TIMES DAILY, Disp: 180 capsule, Rfl: 1 .  hydrOXYzine (ATARAX/VISTARIL) 10 MG tablet, TK 1 TO 2 TS PO HS, Disp: , Rfl: 1 .  ipratropium (ATROVENT) 0.06 % nasal spray, Place 1-2 sprays each nostril 1-2 times per day, Disp: 15 mL, Rfl: 5 .  irbesartan (AVAPRO) 150 MG tablet, TAKE 1 TABLET(150 MG) BY MOUTH DAILY, Disp: 30 tablet, Rfl: 9 .  Lactase (DAIRY-RELIEF PO), Take 1 capsule by mouth daily as needed (stomach upset). , Disp: , Rfl:  .  levalbuterol (XOPENEX) 1.25 MG/3ML nebulizer solution, USE 1 VIAL VIA NEBULIZER EVERY 4 HOURS AS NEEDED FOR WHEEZING, Disp: 1650 mL, Rfl: 0 .  Magnesium 250 MG TABS, Take 1 tablet by mouth daily., Disp: , Rfl:  .  methimazole (TAPAZOLE) 5 MG tablet, Take 2.5 mg daily by mouth, with meals, Disp: 30 tablet, Rfl: 5 .  montelukast (SINGULAIR) 10 MG tablet, TAKE 1 TABLET BY MOUTH EVERY DAY, Disp: 90 tablet, Rfl: 1 .  ONETOUCH DELICA LANCETS 09X MISC, as needed (blood check). , Disp: , Rfl:  .  ONETOUCH VERIO test strip, 1 each by Other route as needed (blood monitoring). , Disp: , Rfl:  .  pantoprazole (PROTONIX) 40 MG tablet, TAKE 1 TABLET BY MOUTH EVERY DAY, Disp: 90 tablet, Rfl: 1 .  PATADAY 0.2 % SOLN, INT 1 GTT IN OU QD PRN, Disp: , Rfl: 4 .  polyethylene glycol (MIRALAX / GLYCOLAX) packet, Take 17 g by mouth daily., Disp: , Rfl:  .  Potassium 99 MG TABS, Take 99 mg by mouth daily., Disp: , Rfl:  .  Probiotic Product (ALIGN) 4 MG  CAPS, Take 1 capsule by mouth daily., Disp: , Rfl:  .  SPIRIVA RESPIMAT 1.25 MCG/ACT AERS, INHALE 2 PUFFS BY MOUTH EVERY DAY, Disp: 4 g, Rfl: 5 .  SUMAtriptan (IMITREX) 100 MG tablet, Take 100 mg x 1 may, repeat in 2 hour for migraine., Disp: 10 tablet, Rfl: 0 .  tolterodine (DETROL LA) 4 MG 24 hr capsule, Take 4 mg by mouth daily., Disp: , Rfl:  .  XARELTO 20 MG TABS tablet, TAKE 1 TABLET(20 MG) BY MOUTH DAILY WITH SUPPER, Disp: 90 tablet, Rfl: 1  Current Facility-Administered Medications:  .  omalizumab Arvid Right) injection 300 mg,  300 mg, Subcutaneous, Q28 days, Kozlow, Donnamarie Poag, MD, 300 mg at 11/04/18 1136   Observations/Objective: Height 5\' 5"  (1.651 m), weight 222 lb 6 oz (100.9 kg).  Physical Exam  Physical Exam Constitutional:      General: The patient is not in acute distress. Pulmonary:     Effort: Pulmonary effort is normal. No respiratory distress.  Neurological:     Mental Status: The patient is alert and oriented to person, place, and time.  Psychiatric:        Mood and Affect: Mood normal.        Behavior: Behavior normal.   Assessment and Plan Left foot pain Difficult to assess over the phone.  Given chronic intermittent.. concerning for gout. No clear suggestion of DVT or infection.  Possibly pain from OA vs diabetic neuropathy.  We will start by checking cbc and uric acid. Will make referral to podiatry for likely in office exam.    I discussed the assessment and treatment plan with the patient. The patient was provided an opportunity to ask questions and all were answered. The patient agreed with the plan and demonstrated an understanding of the instructions.   The patient was advised to call back or seek an in-person evaluation if the symptoms worsen or if the condition fails to improve as anticipated.     [name], MD

## 2018-11-14 NOTE — Progress Notes (Signed)
5/1 appointment 2:15 Pt aware

## 2018-11-14 NOTE — Assessment & Plan Note (Signed)
Difficult to assess over the phone.  Given chronic intermittent.. concerning for gout. No clear suggestion of DVT or infection.  Possibly pain from OA vs diabetic neuropathy.  We will start by checking cbc and uric acid. Will make referral to podiatry for likely in office exam.

## 2018-11-14 NOTE — Telephone Encounter (Signed)
Copied from [place] [id number]. Topic: General - Other >> Nov 13, 2018  4:39 PM Rainey Pines A wrote: Reason for CRM: Patient said that she would like a callback from Dr. Diona Browner nurse in regards to getting a referral to a podiatrist.

## 2018-11-15 LAB — URIC ACID: Uric Acid, Serum: 4.2 mg/dL (ref 2.5–7.0)

## 2018-11-15 LAB — CBC WITH DIFFERENTIAL/PLATELET
Absolute Monocytes: 637 cells/uL (ref 200–950)
Basophils Absolute: 65 cells/uL (ref 0–200)
Basophils Relative: 0.6 %
Eosinophils Absolute: 11 cells/uL — ABNORMAL LOW (ref 15–500)
Eosinophils Relative: 0.1 %
HCT: 40.9 % (ref 35.0–45.0)
Hemoglobin: 13.5 g/dL (ref 11.7–15.5)
Lymphs Abs: 2333 cells/uL (ref 850–3900)
MCH: 24.5 pg — ABNORMAL LOW (ref 27.0–33.0)
MCHC: 33 g/dL (ref 32.0–36.0)
MCV: 74.4 fL — ABNORMAL LOW (ref 80.0–100.0)
MPV: 9.8 fL (ref 7.5–12.5)
Monocytes Relative: 5.9 %
Neutro Abs: 7754 cells/uL (ref 1500–7800)
Neutrophils Relative %: 71.8 %
Platelets: 377 10*3/uL (ref 140–400)
RBC: 5.5 10*6/uL — ABNORMAL HIGH (ref 3.80–5.10)
RDW: 16.3 % — ABNORMAL HIGH (ref 11.0–15.0)
Total Lymphocyte: 21.6 %
WBC: 10.8 10*3/uL (ref 3.8–10.8)

## 2018-11-18 ENCOUNTER — Encounter: Payer: Self-pay | Admitting: Allergy and Immunology

## 2018-11-18 ENCOUNTER — Ambulatory Visit (INDEPENDENT_AMBULATORY_CARE_PROVIDER_SITE_OTHER): Payer: Medicare Other | Admitting: Allergy and Immunology

## 2018-11-18 ENCOUNTER — Other Ambulatory Visit: Payer: Self-pay

## 2018-11-18 VITALS — BP 148/88 | HR 106 | Temp 98.5°F | Resp 20

## 2018-11-18 DIAGNOSIS — K219 Gastro-esophageal reflux disease without esophagitis: Secondary | ICD-10-CM | POA: Diagnosis not present

## 2018-11-18 DIAGNOSIS — M5137 Other intervertebral disc degeneration, lumbosacral region: Secondary | ICD-10-CM | POA: Diagnosis not present

## 2018-11-18 DIAGNOSIS — M5136 Other intervertebral disc degeneration, lumbar region: Secondary | ICD-10-CM | POA: Diagnosis not present

## 2018-11-18 DIAGNOSIS — I5189 Other ill-defined heart diseases: Secondary | ICD-10-CM | POA: Diagnosis not present

## 2018-11-18 DIAGNOSIS — S29012A Strain of muscle and tendon of back wall of thorax, initial encounter: Secondary | ICD-10-CM | POA: Diagnosis not present

## 2018-11-18 DIAGNOSIS — J4550 Severe persistent asthma, uncomplicated: Secondary | ICD-10-CM

## 2018-11-18 DIAGNOSIS — M9902 Segmental and somatic dysfunction of thoracic region: Secondary | ICD-10-CM | POA: Diagnosis not present

## 2018-11-18 DIAGNOSIS — S39012A Strain of muscle, fascia and tendon of lower back, initial encounter: Secondary | ICD-10-CM | POA: Diagnosis not present

## 2018-11-18 DIAGNOSIS — I272 Pulmonary hypertension, unspecified: Secondary | ICD-10-CM | POA: Diagnosis not present

## 2018-11-18 DIAGNOSIS — J3089 Other allergic rhinitis: Secondary | ICD-10-CM | POA: Diagnosis not present

## 2018-11-18 DIAGNOSIS — M9905 Segmental and somatic dysfunction of pelvic region: Secondary | ICD-10-CM | POA: Diagnosis not present

## 2018-11-18 DIAGNOSIS — M9903 Segmental and somatic dysfunction of lumbar region: Secondary | ICD-10-CM | POA: Diagnosis not present

## 2018-11-18 DIAGNOSIS — M9904 Segmental and somatic dysfunction of sacral region: Secondary | ICD-10-CM | POA: Diagnosis not present

## 2018-11-18 MED ORDER — IPRATROPIUM BROMIDE 0.06 % NA SOLN: NASAL | 5 refills | Status: DC

## 2018-11-18 NOTE — Progress Notes (Signed)
[place] [facility] [place] [facility] [place]   Follow-up Note  Referring Provider: [name], MD Primary Provider: [name], MD Date of Office Visit: 11/18/2018  Subjective:   [name] (DOB: [birth year]) is a 73 y.o. female who returns to the [facility] on 11/18/2018 in re-evaluation of the following:  HPI: Hanako returns to this clinic in reevaluation of asthma and allergic rhinitis and LPR and a history of pulmonary hypertension, diastolic dysfunction, and sleep apnea.  Her last visit to this clinic was 09 September 2018.  Overall she thinks that she has done relatively well regarding her airway.  Her requirement for short acting bronchodilators is about 1 time every 3 to 4 days.  She does have some occasional wheezing but this is her baseline.  She does not really exercise to any degree because of multiple musculoskeletal issues.  In the past she was receiving prednisone about 1 time per month for a few days because of her musculoskeletal issue but over the course of the past several months this has not been the case.  She does continue to use Advair and Spiriva and montelukast on a daily basis and omalizumab injections.  Her nose is doing relatively well at this point.  She has had a little bit more runny nose and she is requesting a refill on her nasal ipratropium.  She continues to have some occasional issues with reflux and "gas" which has been evaluated by her gastroenterologist.  She has actually been a little bit worse since she discontinued her ranitidine 3 weeks ago because of the FDA recall on this product.  She has not restarted famotidine yet.  She continues on CPAP for her sleep apnea.  Allergies as of 11/18/2018      Reactions   Latex Hives   Penicillins Swelling   Has patient had a PCN reaction causing immediate rash, facial/tongue/throat swelling, SOB or lightheadedness with hypotension patient had a PCN reaction causing  severe rash involving mucus membranes or skin necrosis: [id number] Has patient had a PCN reaction that required hospitalization/No Has patient had a PCN reaction occurring within the last 10 years: No If all of the above answers are "NO", then may proceed with Cephalosporin use.   Lyrica [pregabalin] Other (See Comments)   Blurred vision.   Phenytoin Sodium Extended Swelling   Swelling    Vicodin [hydrocodone-acetaminophen] Nausea And Vomiting      Medication List      acetaminophen-codeine 300-30 MG tablet Commonly known as:  TYLENOL #3 TAKE 1 TABLET BY MOUTH EVERY 12 HOURS AS NEEDED FOR UP TO 7 DAYS FOR MODERATE PAIN   albuterol 108 (90 Base) MCG/ACT inhaler Commonly known as:  VENTOLIN HFA INHALE 2 PUFFS BY MOUTH INTO THE LUNGS EVERY 6 HOURS AS NEEDED FOR WHEEZING OR SHORTNESS OF BREATH   Align 4 MG Caps Take 1 capsule by mouth daily.   APPLE CIDER VINEGAR PO Take 2 tablets by mouth 2 (two) times daily.   azelastine 0.1 % nasal spray Commonly known as:  Astelin USE 1-2 SPRAYS IN EACH NOSTRIL TWICE DAILY AS NEEDED   clobetasol cream 0.05 % Commonly known as:  TEMOVATE Apply 1 application topically 2 (two) times daily.   cyclobenzaprine 10 MG tablet Commonly known as:  FLEXERIL Take 1 tablet (10 mg total) by mouth at bedtime as needed for muscle spasms.   DAIRY-RELIEF PO Take 1 capsule by mouth daily as needed (stomach upset).  diltiazem 120 MG 24 hr capsule Commonly known as:  Cartia XT Take 1 capsule (120 mg total) by mouth daily. KEEP OV.   DULoxetine 30 MG capsule Commonly known as:  CYMBALTA TAKE 1 CAPSULE(30 MG) BY MOUTH DAILY   Empagliflozin-linaGLIPtin 10-5 MG Tabs Commonly known as:  Glyxambi Take 1 tablet by mouth daily.   EPINEPHrine 0.3 mg/0.3 mL Soaj injection Commonly known as:  EPI-PEN USE AS DIRECTED FOR LIFE THREATENING ALLERGIC REACTIONS   famotidine 20 MG tablet Commonly known as:  Pepcid Take 1 tablet (20 mg total) by mouth 2 (two)  times daily.   fluticasone 220 MCG/ACT inhaler Commonly known as:  Flovent HFA Inhale 2 puffs into the lungs 2 (two) times daily as needed.   fluticasone-salmeterol 230-21 MCG/ACT inhaler Commonly known as:  Advair HFA Inhale two puffs twice daily to prevent cough or wheeze. Rinse mouth after use.   furosemide 40 MG tablet Commonly known as:  LASIX TAKE 1 TABLET(40 MG) BY MOUTH DAILY   gabapentin 300 MG capsule Commonly known as:  NEURONTIN TAKE 2 CAPSULES(600 MG) BY MOUTH THREE TIMES DAILY   hydrOXYzine 10 MG tablet Commonly known as:  ATARAX/VISTARIL TK 1 TO 2 TS PO HS   ipratropium 0.06 % nasal spray Commonly known as:  ATROVENT Place 1-2 sprays each nostril 1-2 times per day   irbesartan 150 MG tablet Commonly known as:  AVAPRO TAKE 1 TABLET(150 MG) BY MOUTH DAILY   levalbuterol 1.25 MG/3ML nebulizer solution Commonly known as:  XOPENEX USE 1 VIAL VIA NEBULIZER EVERY 4 HOURS AS NEEDED FOR WHEEZING   Magnesium 250 MG Tabs Take 1 tablet by mouth daily.   methimazole 5 MG tablet Commonly known as:  TAPAZOLE Take 2.5 mg daily by mouth, with meals   montelukast 10 MG tablet Commonly known as:  SINGULAIR TAKE 1 TABLET BY MOUTH EVERY DAY   OneTouch Delica Lancets 99J Misc as needed (blood check).   OneTouch Verio test strip Generic drug:  glucose blood 1 each by Other route as needed (blood monitoring).   pantoprazole 40 MG tablet Commonly known as:  PROTONIX TAKE 1 TABLET BY MOUTH EVERY DAY   Pataday 0.2 % Soln Generic drug:  Olopatadine HCl INT 1 GTT IN OU QD PRN   polyethylene glycol 17 g packet Commonly known as:  MIRALAX / GLYCOLAX Take 17 g by mouth daily.   Potassium 99 MG Tabs Take 99 mg by mouth daily.   Spiriva Respimat 1.25 MCG/ACT Aers Generic drug:  Tiotropium Bromide Monohydrate INHALE 2 PUFFS BY MOUTH EVERY DAY   SUMAtriptan 100 MG tablet Commonly known as:  IMITREX Take 100 mg x 1 may, repeat in 2 hour for migraine.   tolterodine  4 MG 24 hr capsule Commonly known as:  DETROL LA Take 4 mg by mouth daily.   Vitamin D3 1.25 MG (50000 UT) Caps Take 1 capsule by mouth every 7 (seven) days.   Xarelto 20 MG Tabs tablet Generic drug:  rivaroxaban TAKE 1 TABLET(20 MG) BY MOUTH DAILY WITH SUPPER       Past Medical History:  Diagnosis Date  . Allergic rhinitis   . Allergy   . Arthritis   . Asthma   . Chronic headache   . Diabetes mellitus   . DVT (deep venous thrombosis) ([place])   . Dyspnea   . Heart murmur   . Hypertension   . Pulmonary embolism ([place])   . Pulmonary hypertension ([place])   . Seizures ([place])  Past Surgical History:  Procedure Laterality Date  . ABDOMINAL HYSTERECTOMY     partial, has ovaries  . CARDIAC CATHETERIZATION  12/28/2010   Mod. pulmonary hypertension, normal coronary arteries  . DOPPLER ECHOCARDIOGRAPHY  10/08/2011   EF=>55%,mild asymmetric LVH, mod. TR, mod. PH, mild to mod LA dilatation  . KNEE ARTHROSCOPY Left   . KNEE SURGERY    . Nuclear Stress Test  05/20/2006   No ischemia  . PARTIAL HYSTERECTOMY    . PLANTAR FASCIA SURGERY    . TONSILLECTOMY      Review of systems negative except as noted in HPI / PMHx or noted below:  Review of Systems  Constitutional: Negative.   HENT: Negative.   Eyes: Negative.   Respiratory: Negative.   Cardiovascular: Negative.   Gastrointestinal: Negative.   Genitourinary: Negative.   Musculoskeletal: Negative.   Skin: Negative.   Neurological: Negative.   Endo/Heme/Allergies: Negative.   Psychiatric/Behavioral: Negative.      Objective:   Vitals:   11/18/18 1116  BP: (!) 148/88  Pulse: (!) 106  Resp: 20  Temp: 98.5 F (36.9 C)  SpO2: 99%          Physical Exam Constitutional:      Appearance: She is not diaphoretic.  HENT:     Head: Normocephalic.     Right Ear: Tympanic membrane, ear canal and external ear normal.     Left Ear: Tympanic membrane, ear canal and external ear normal.     Nose: Nose normal. No mucosal  edema or rhinorrhea.     Mouth/Throat:     Pharynx: Uvula midline. No oropharyngeal exudate.  Eyes:     Conjunctiva/sclera: Conjunctivae normal.  Neck:     Thyroid: No thyromegaly.     Trachea: Trachea normal. No tracheal tenderness or tracheal deviation.  Cardiovascular:     Rate and Rhythm: Normal rate and regular rhythm.     Heart sounds: S1 normal and S2 normal. Murmur (Systolic) present.  Pulmonary:     Effort: No respiratory distress.     Breath sounds: Normal breath sounds. No stridor. No wheezing or rales.  Lymphadenopathy:     Head:     Right side of head: No tonsillar adenopathy.     Left side of head: No tonsillar adenopathy.     Cervical: No cervical adenopathy.  Skin:    Findings: No erythema or rash.     Nails: There is no clubbing.   Neurological:     Mental Status: She is alert.     Diagnostics:    Spirometry was performed and demonstrated an FEV1 of 1.56 at 83 % of predicted.  Assessment and Plan:   1. Asthma, severe persistent, well-controlled   2. Pulmonary hypertension ([place])   3. Diastolic dysfunction   4. Other allergic rhinitis   5. LPRD (laryngopharyngeal reflux disease)     1. Continue Advair 230 - 2 inhalations twice a day  2. Continue Spiriva 2.5 respimat - two inhalations one time per day.  3. Continue Montelukast 10mg  - one tablet one time per day.  4. Continue Xolair and Epi-Pen  5. Continue pantoprazole 40mg  in the AM and Famotidine 40 mg in the PM  6. If needed:   A. Nasal saline several times per day  B. Mucinex DM two times per day  C. nasal azelastine two sprays each nostril 1-2 times per day  D. Ipratropium 0.06% -1-2 sprays each nostril 1-2 times per day to dry nose  E. Proventil  HFA or albuterol nebulization  7. Add Flovent 220 - 2 inhalations 2 times a day during "FLARE UP"  8. Return to clinic in summer 2020 or earlier if there is a problem.  Shaniah appears to be stable regarding her multiple medical issues followed  in this clinic and she will remain on a very large collection of medical therapy directed against respiratory tract inflammation and reflux as noted above.  Assuming she continues to do well I will see her back in this clinic in about 12 weeks or earlier if there is a problem.  Allena Katz, MD Allergy / Immunology [place]

## 2018-11-18 NOTE — Patient Instructions (Addendum)
  1. Continue Advair 230 - 2 inhalations twice a day  2. Continue Spiriva 2.5 respimat - two inhalations one time per day.  3. Continue Montelukast 10mg  - one tablet one time per day.  4. Continue Xolair and Epi-Pen  5. Continue pantoprazole 40mg  in the AM and Famotidine 40 mg in the PM  6. If needed:   A. Nasal saline several times per day  B. Mucinex DM two times per day  C. nasal azelastine two sprays each nostril 1-2 times per day  D. Ipratropium 0.06% -1-2 sprays each nostril 1-2 times per day to dry nose  E. Proventil HFA or albuterol nebulization  7. Add Flovent 220 - 2 inhalations 2 times a day during "FLARE UP"  8. Return to clinic in summer 2020 or earlier if there is a problem.

## 2018-11-19 ENCOUNTER — Encounter: Payer: Self-pay | Admitting: Allergy and Immunology

## 2018-11-20 DIAGNOSIS — M9904 Segmental and somatic dysfunction of sacral region: Secondary | ICD-10-CM | POA: Diagnosis not present

## 2018-11-20 DIAGNOSIS — M5137 Other intervertebral disc degeneration, lumbosacral region: Secondary | ICD-10-CM | POA: Diagnosis not present

## 2018-11-20 DIAGNOSIS — M9903 Segmental and somatic dysfunction of lumbar region: Secondary | ICD-10-CM | POA: Diagnosis not present

## 2018-11-20 DIAGNOSIS — M9905 Segmental and somatic dysfunction of pelvic region: Secondary | ICD-10-CM | POA: Diagnosis not present

## 2018-11-20 DIAGNOSIS — S29012A Strain of muscle and tendon of back wall of thorax, initial encounter: Secondary | ICD-10-CM | POA: Diagnosis not present

## 2018-11-20 DIAGNOSIS — M9902 Segmental and somatic dysfunction of thoracic region: Secondary | ICD-10-CM | POA: Diagnosis not present

## 2018-11-20 DIAGNOSIS — M5136 Other intervertebral disc degeneration, lumbar region: Secondary | ICD-10-CM | POA: Diagnosis not present

## 2018-11-20 DIAGNOSIS — S39012A Strain of muscle, fascia and tendon of lower back, initial encounter: Secondary | ICD-10-CM | POA: Diagnosis not present

## 2018-11-21 ENCOUNTER — Other Ambulatory Visit: Payer: Self-pay | Admitting: Allergy and Immunology

## 2018-11-27 ENCOUNTER — Other Ambulatory Visit (INDEPENDENT_AMBULATORY_CARE_PROVIDER_SITE_OTHER): Payer: Medicare Other

## 2018-11-27 ENCOUNTER — Other Ambulatory Visit: Payer: Self-pay

## 2018-11-27 ENCOUNTER — Ambulatory Visit (INDEPENDENT_AMBULATORY_CARE_PROVIDER_SITE_OTHER): Payer: Medicare Other

## 2018-11-27 ENCOUNTER — Other Ambulatory Visit: Payer: Self-pay | Admitting: Podiatry

## 2018-11-27 ENCOUNTER — Encounter: Payer: Self-pay | Admitting: Podiatry

## 2018-11-27 ENCOUNTER — Ambulatory Visit (INDEPENDENT_AMBULATORY_CARE_PROVIDER_SITE_OTHER): Payer: Medicare Other | Admitting: Podiatry

## 2018-11-27 VITALS — BP 119/79 | HR 104 | Temp 97.3°F | Resp 16

## 2018-11-27 DIAGNOSIS — M79676 Pain in unspecified toe(s): Secondary | ICD-10-CM | POA: Diagnosis not present

## 2018-11-27 DIAGNOSIS — E0500 Thyrotoxicosis with diffuse goiter without thyrotoxic crisis or storm: Secondary | ICD-10-CM | POA: Diagnosis not present

## 2018-11-27 DIAGNOSIS — E1142 Type 2 diabetes mellitus with diabetic polyneuropathy: Secondary | ICD-10-CM

## 2018-11-27 DIAGNOSIS — M2041 Other hammer toe(s) (acquired), right foot: Secondary | ICD-10-CM | POA: Diagnosis not present

## 2018-11-27 DIAGNOSIS — M2042 Other hammer toe(s) (acquired), left foot: Secondary | ICD-10-CM

## 2018-11-27 DIAGNOSIS — B351 Tinea unguium: Secondary | ICD-10-CM | POA: Diagnosis not present

## 2018-11-27 DIAGNOSIS — M778 Other enthesopathies, not elsewhere classified: Secondary | ICD-10-CM

## 2018-11-27 LAB — T4, FREE: Free T4: 0.93 ng/dL (ref 0.60–1.60)

## 2018-11-27 LAB — TSH: TSH: 1.84 u[IU]/mL (ref 0.35–4.50)

## 2018-11-27 LAB — T3, FREE: T3, Free: 3 pg/mL (ref 2.3–4.2)

## 2018-11-27 MED ORDER — LEVALBUTEROL HCL 1.25 MG/3ML IN NEBU: 1.2500 mg | INHALATION_SOLUTION | Freq: Four times a day (QID) | RESPIRATORY_TRACT | 1 refills | Status: DC | PRN

## 2018-11-27 NOTE — Telephone Encounter (Signed)
Patient notified of Xopenex sent to pharmacy.

## 2018-11-27 NOTE — Progress Notes (Signed)
Subjective:  Patient ID: [name], female    DOB: 08-19-1945,  MRN: [id number] HPI Chief Complaint  Patient presents with  . Foot Pain    Patient states she thinks she is just having nerve pain, tightness, is a diabetic, nails are long and thick, callused areas hallux medial bilateral, has seen Dr. Barkley Bruns and wanted to do surgery, but would like to avoid  . New Patient (Initial Visit)    Est pt 3+    73 y.o. female presents with the above complaint.   ROS: Denies fever chills nausea vomiting muscle aches pains calf pain back pain chest pain shortness of breath.  Past Medical History:  Diagnosis Date  . Allergic rhinitis   . Allergy   . Arthritis   . Asthma   . Chronic headache   . Diabetes mellitus   . DVT (deep venous thrombosis) ([place])   . Dyspnea   . Heart murmur   . Hypertension   . Pulmonary embolism ([place])   . Pulmonary hypertension ([place])   . Seizures ([place])    Past Surgical History:  Procedure Laterality Date  . ABDOMINAL HYSTERECTOMY     partial, has ovaries  . CARDIAC CATHETERIZATION  12/28/2010   Mod. pulmonary hypertension, normal coronary arteries  . DOPPLER ECHOCARDIOGRAPHY  10/08/2011   EF=>55%,mild asymmetric LVH, mod. TR, mod. PH, mild to mod LA dilatation  . KNEE ARTHROSCOPY Left   . KNEE SURGERY    . Nuclear Stress Test  05/20/2006   No ischemia  . PARTIAL HYSTERECTOMY    . PLANTAR FASCIA SURGERY    . TONSILLECTOMY      Current Outpatient Medications:  .  acetaminophen-codeine (TYLENOL #3) 300-30 MG tablet, TAKE 1 TABLET BY MOUTH EVERY 12 HOURS AS NEEDED FOR UP TO 7 DAYS FOR MODERATE PAIN, Disp: 60 tablet, Rfl: 0 .  albuterol (VENTOLIN HFA) 108 (90 Base) MCG/ACT inhaler, INHALE 2 PUFFS BY MOUTH INTO THE LUNGS EVERY 6 HOURS AS NEEDED FOR WHEEZING, SHORTNESS OF BREATH, Disp: 54 g, Rfl: 0 .  APPLE CIDER VINEGAR PO, Take 2 tablets by mouth 2 (two) times daily., Disp: , Rfl:  .  azelastine (ASTELIN) 0.1 % nasal spray, USE 1-2 SPRAYS IN EACH NOSTRIL  TWICE DAILY AS NEEDED, Disp: 30 mL, Rfl: 5 .  Cholecalciferol (VITAMIN D3) 1.25 MG (50000 UT) CAPS, Take 1 capsule by mouth every 7 (seven) days., Disp: 12 capsule, Rfl: 0 .  clobetasol cream (TEMOVATE) 0.25 %, Apply 1 application topically 2 (two) times daily., Disp: , Rfl:  .  cyclobenzaprine (FLEXERIL) 10 MG tablet, Take 1 tablet (10 mg total) by mouth at bedtime as needed for muscle spasms., Disp: 30 tablet, Rfl: 1 .  diltiazem (CARTIA XT) 120 MG 24 hr capsule, Take 1 capsule (120 mg total) by mouth daily. KEEP OV., Disp: 30 capsule, Rfl: 2 .  DULoxetine (CYMBALTA) 30 MG capsule, TAKE 1 CAPSULE(30 MG) BY MOUTH DAILY, Disp: 30 capsule, Rfl: 5 .  Empagliflozin-linaGLIPtin (GLYXAMBI) 10-5 MG TABS, Take 1 tablet by mouth daily., Disp: 30 tablet, Rfl: 2 .  EPINEPHrine 0.3 mg/0.3 mL IJ SOAJ injection, USE AS DIRECTED FOR LIFE THREATENING ALLERGIC REACTIONS, Disp: 2 Device, Rfl: 2 .  famotidine (PEPCID) 20 MG tablet, Take 1 tablet (20 mg total) by mouth 2 (two) times daily., Disp: 60 tablet, Rfl: 5 .  fluticasone (FLOVENT HFA) 220 MCG/ACT inhaler, Inhale 2 puffs into the lungs 2 (two) times daily as needed., Disp: 1 Inhaler, Rfl: 5 .  fluticasone-salmeterol (ADVAIR  HFA) 735-32 MCG/ACT inhaler, Inhale two puffs twice daily to prevent cough or wheeze. Rinse mouth after use., Disp: 12 g, Rfl: 3 .  furosemide (LASIX) 40 MG tablet, TAKE 1 TABLET(40 MG) BY MOUTH DAILY, Disp: 30 tablet, Rfl: 3 .  gabapentin (NEURONTIN) 300 MG capsule, TAKE 2 CAPSULES(600 MG) BY MOUTH THREE TIMES DAILY, Disp: 180 capsule, Rfl: 1 .  hydrOXYzine (ATARAX/VISTARIL) 10 MG tablet, TK 1 TO 2 TS PO HS, Disp: , Rfl: 1 .  ipratropium (ATROVENT) 0.06 % nasal spray, Place 1-2 sprays each nostril 1-2 times per day, Disp: 15 mL, Rfl: 5 .  irbesartan (AVAPRO) 150 MG tablet, TAKE 1 TABLET(150 MG) BY MOUTH DAILY, Disp: 30 tablet, Rfl: 9 .  Lactase (DAIRY-RELIEF PO), Take 1 capsule by mouth daily as needed (stomach upset). , Disp: , Rfl:  .   levalbuterol (XOPENEX) 1.25 MG/3ML nebulizer solution, Take 1.25 mg by nebulization every 6 (six) hours as needed for wheezing or shortness of breath., Disp: 1080 mL, Rfl: 1 .  Magnesium 250 MG TABS, Take 1 tablet by mouth daily., Disp: , Rfl:  .  methimazole (TAPAZOLE) 5 MG tablet, Take 2.5 mg daily by mouth, with meals, Disp: 30 tablet, Rfl: 5 .  montelukast (SINGULAIR) 10 MG tablet, TAKE 1 TABLET BY MOUTH EVERY DAY, Disp: 90 tablet, Rfl: 1 .  ONETOUCH DELICA LANCETS 99M MISC, as needed (blood check). , Disp: , Rfl:  .  ONETOUCH VERIO test strip, 1 each by Other route as needed (blood monitoring). , Disp: , Rfl:  .  pantoprazole (PROTONIX) 40 MG tablet, TAKE 1 TABLET BY MOUTH EVERY DAY, Disp: 90 tablet, Rfl: 1 .  PATADAY 0.2 % SOLN, INT 1 GTT IN OU QD PRN, Disp: , Rfl: 4 .  polyethylene glycol (MIRALAX / GLYCOLAX) packet, Take 17 g by mouth daily., Disp: , Rfl:  .  Potassium 99 MG TABS, Take 99 mg by mouth daily., Disp: , Rfl:  .  Probiotic Product (ALIGN) 4 MG CAPS, Take 1 capsule by mouth daily., Disp: , Rfl:  .  SPIRIVA RESPIMAT 1.25 MCG/ACT AERS, INHALE 2 PUFFS BY MOUTH EVERY DAY, Disp: 4 g, Rfl: 5 .  SUMAtriptan (IMITREX) 100 MG tablet, Take 100 mg x 1 may, repeat in 2 hour for migraine., Disp: 10 tablet, Rfl: 0 .  tolterodine (DETROL LA) 4 MG 24 hr capsule, Take 4 mg by mouth daily., Disp: , Rfl:  .  XARELTO 20 MG TABS tablet, TAKE 1 TABLET(20 MG) BY MOUTH DAILY WITH SUPPER, Disp: 90 tablet, Rfl: 1  Current Facility-Administered Medications:  .  omalizumab Arvid Right) injection 300 mg, 300 mg, Subcutaneous, Q28 days, Kozlow, Donnamarie Poag, MD, 300 mg at 11/04/18 1136  Allergies  Allergen Reactions  . Latex Hives  . Penicillins Swelling    Has patient had a PCN reaction causing immediate rash, facial/tongue/throat swelling, SOB or lightheadedness with hypotension patient had a PCN reaction causing severe rash involving mucus membranes or skin necrosis: [id number]} Has patient had a PCN  reaction that required hospitalization/No Has patient had a PCN reaction occurring within the last 10 years: No If all of the above answers are "NO", then may proceed with Cephalosporin use.     Recardo Evangelist [Pregabalin] Other (See Comments)    Blurred vision.  Marland Kitchen Phenytoin Sodium Extended Swelling    Swelling   . Vicodin [Hydrocodone-Acetaminophen] Nausea And Vomiting   Review of Systems Objective:   Vitals:   11/27/18 1443  BP: 119/79  Pulse: (!) 104  Resp: 16  Temp: (!) 97.3 F (36.3 C)    General: Well developed, nourished, in no acute distress, alert and oriented x3   Dermatological: Skin is warm, dry and supple bilateral. Nails x 10 are well maintained; remaining integument appears unremarkable at this time. There are no open sores, no preulcerative lesions, no rash or signs of infection present.  Vascular: Dorsalis Pedis artery and Posterior Tibial artery pedal pulses are 1/4 bilateral with delayed capillary fill time. Pedal hair growth present. No varicosities and no lower extremity edema present bilateral.   Neruologic: Grossly intact via light touch bilateral. Vibratory intact via tuning fork bilateral. Protective threshold with Semmes Wienstein monofilament diminished to all pedal sites bilateral. Patellar and Achilles deep tendon reflexes 2+ bilateral. No Babinski or clonus noted bilateral.   Musculoskeletal: No gross boney pedal deformities bilateral. No pain, crepitus, or limitation noted with foot and ankle range of motion bilateral. Muscular strength 5/5 in all groups tested bilateral.  Gait: Unassisted, Nonantalgic.    Radiographs:  Radiographs taken today do not demonstrate any acute findings.  Previous surgeries of the fifth metatarsal with internal fixation is present.  Arthroplasties fifth toes bilateral present.  Second toe right.  Assessment & Plan:   Assessment: Neuropathy diabetic.  Angiopathy diabetic.  Digital deformities bilateral.  Pain in limb  secondary to onychomycosis.  Plan: Debridement of toenails 1 through 5 bilateral.  Increased her gabapentin by 300 mg at bedtime.  She would now be taking 900 mg at bedtime.  She will continue 600 mg in the morning and 600 mg at lunch.  She will follow-up with Liliane Channel for diabetic shoes and insoles.  Otherwise I will follow-up with her in a few months.     Max T. [place]illa, [place]

## 2018-11-27 NOTE — Telephone Encounter (Signed)
Patient is calling for a new prescription for her Nebulizer.  [address]

## 2018-11-28 ENCOUNTER — Telehealth: Payer: Self-pay | Admitting: *Deleted

## 2018-11-28 NOTE — Telephone Encounter (Signed)
Pt called request a call from Dr. Stephenie Acres assist and I offered to help she stated she wanted to speak with Caryl Pina. I told pt Caryl Pina was out of the office until next week and can wait.

## 2018-12-01 DIAGNOSIS — J4550 Severe persistent asthma, uncomplicated: Secondary | ICD-10-CM | POA: Diagnosis not present

## 2018-12-02 ENCOUNTER — Other Ambulatory Visit: Payer: Self-pay

## 2018-12-02 ENCOUNTER — Ambulatory Visit (INDEPENDENT_AMBULATORY_CARE_PROVIDER_SITE_OTHER): Payer: Medicare Other

## 2018-12-02 ENCOUNTER — Ambulatory Visit: Payer: Self-pay

## 2018-12-02 DIAGNOSIS — R1013 Epigastric pain: Secondary | ICD-10-CM | POA: Diagnosis not present

## 2018-12-02 DIAGNOSIS — K5901 Slow transit constipation: Secondary | ICD-10-CM | POA: Diagnosis not present

## 2018-12-02 DIAGNOSIS — J4550 Severe persistent asthma, uncomplicated: Secondary | ICD-10-CM

## 2018-12-02 DIAGNOSIS — R143 Flatulence: Secondary | ICD-10-CM | POA: Diagnosis not present

## 2018-12-02 DIAGNOSIS — N3941 Urge incontinence: Secondary | ICD-10-CM | POA: Diagnosis not present

## 2018-12-09 DIAGNOSIS — N3941 Urge incontinence: Secondary | ICD-10-CM | POA: Diagnosis not present

## 2018-12-10 ENCOUNTER — Telehealth: Payer: Self-pay | Admitting: Allergy and Immunology

## 2018-12-10 ENCOUNTER — Other Ambulatory Visit: Payer: Self-pay | Admitting: Internal Medicine

## 2018-12-10 NOTE — Telephone Encounter (Signed)
Patient needs a prior authorization for her famotidine according to [facility].

## 2018-12-10 NOTE — Telephone Encounter (Signed)
Patient informed to purchase otc and is agreeable with this plan.

## 2018-12-10 NOTE — Telephone Encounter (Signed)
PA denied. This is an OTC medication.

## 2018-12-10 NOTE — Telephone Encounter (Signed)
PA submitted on covermymeds

## 2018-12-11 ENCOUNTER — Ambulatory Visit (INDEPENDENT_AMBULATORY_CARE_PROVIDER_SITE_OTHER): Payer: Medicare Other | Admitting: Family Medicine

## 2018-12-11 ENCOUNTER — Other Ambulatory Visit: Payer: Self-pay

## 2018-12-11 ENCOUNTER — Ambulatory Visit: Payer: Medicare Other | Admitting: Orthotics

## 2018-12-11 ENCOUNTER — Encounter: Payer: Self-pay | Admitting: Family Medicine

## 2018-12-11 VITALS — Ht 65.0 in | Wt 229.4 lb

## 2018-12-11 DIAGNOSIS — M545 Low back pain, unspecified: Secondary | ICD-10-CM

## 2018-12-11 DIAGNOSIS — R5383 Other fatigue: Secondary | ICD-10-CM

## 2018-12-11 DIAGNOSIS — M2042 Other hammer toe(s) (acquired), left foot: Secondary | ICD-10-CM

## 2018-12-11 DIAGNOSIS — E1142 Type 2 diabetes mellitus with diabetic polyneuropathy: Secondary | ICD-10-CM

## 2018-12-11 DIAGNOSIS — G8929 Other chronic pain: Secondary | ICD-10-CM | POA: Insufficient documentation

## 2018-12-11 DIAGNOSIS — G894 Chronic pain syndrome: Secondary | ICD-10-CM

## 2018-12-11 DIAGNOSIS — B351 Tinea unguium: Secondary | ICD-10-CM

## 2018-12-11 DIAGNOSIS — M2041 Other hammer toe(s) (acquired), right foot: Secondary | ICD-10-CM

## 2018-12-11 MED ORDER — ACETAMINOPHEN-CODEINE #3 300-30 MG PO TABS: ORAL_TABLET | ORAL | 0 refills | Status: DC

## 2018-12-11 MED ORDER — CYCLOBENZAPRINE HCL 10 MG PO TABS: 10.0000 mg | ORAL_TABLET | Freq: Every evening | ORAL | 1 refills | Status: DC | PRN

## 2018-12-11 NOTE — Patient Instructions (Signed)
Restart flexeril for muscle spasms as needed. Continue ice and low back stretches.

## 2018-12-11 NOTE — Assessment & Plan Note (Signed)
Mild worsening from inactivity and no longer able to afford chiropractor. On cymbalta, gabapentin.  Can restart flexeril prn.  Rx for tylenol #3 sent in.

## 2018-12-11 NOTE — Assessment & Plan Note (Signed)
On gabapentin (600/600/900), flexeril.  On cymbalta for chronic pain.  Requests a refill of tylenol #3.Marland Kitchen last RX in 05/2018  Last UDS 04/01/2018 Medication and dose: tyelnol # 3 1 po every 6 hours for pain.. uses prn # pills per month: ,30.Marland Kitchen last refill # 60 lasted > 6 months Last UDS date: 03/2018 Opioid Treatment Agreement signed (Y/N): y Opioid Treatment Agreement last reviewed with patient:   today verbally [place] reviewed this encounter (include red flags): Yes

## 2018-12-11 NOTE — Assessment & Plan Note (Signed)
Neg lab eval and no better after vit D supplementation.  likely multifactorial due to sedating medications, limit as able  AND decreased activity.  Restart exercise as able.  Denies depression.

## 2018-12-11 NOTE — Progress Notes (Signed)

## 2018-12-11 NOTE — Progress Notes (Signed)
VIRTUAL VISIT Due to national recommendations of social distancing due to [place] 19, a virtual visit is felt to be most appropriate for this patient at this time.   I connected with the patient on 12/11/18 at  8:20 AM EDT by virtual telehealth platform and verified that I am speaking with the correct person using two identifiers.   Interactive audio and video telecommunications were attempted between this provider and patient, however failed, due to patient having technical difficulties OR patient did not have access to video capability.  We continued and completed visit with audio only.    I discussed the limitations, risks, security and privacy concerns of performing an evaluation and management service by  virtual telehealth platform and the availability of in person appointments. I also discussed with the patient that there may be a patient responsible charge related to this service. The patient expressed understanding and agreed to proceed.  Patient location: Home Provider Location: [place] Participants: [name] and [name]   Chief Complaint  Patient presents with  . Back Pain    Requesting Tylenol #3  . Fatigue    History of Present Illness: 73 year old obese female patient presents with chronic low back pain.   She reports that she has had a worsening of low back pain, both sides. Radiation to right buttock.  No new numbness in legs ( has DM neuropathy) no weakness.  She has been unable to walk as much and cannot afford chiropractor.  No falls or new activities.   She is using heat and ice. Using some low back stretches.  On gabapentin (600/600/900), flexeril.  On cymbalta for chronic pain.  Requests a refill of tylenol #3.Marland Kitchen last RX in 05/2018  Last UDS 04/01/2018 Medication and dose: tyelnol # 3 1 po every 6 hours for pain.. uses prn # pills per month: ,30.Marland Kitchen last refill # 60 lasted > 6 months Last UDS date: 03/2018 Opioid Treatment Agreement signed  (Y/N): y Opioid Treatment Agreement last reviewed with patient:   today verbally [place] reviewed this encounter (include red flags): Yes  Denies depression. Still tired after vitamin D rx. Limited activity. Neg lab eval in 08/2018  COVID 19 screen No recent travel or known exposure to [place] The patient denies respiratory symptoms of COVID 19 at this time.  The importance of social distancing was discussed today.   Review of Systems  Constitutional: Positive for malaise/fatigue. Negative for chills and fever.  HENT: Negative for congestion and ear pain.   Eyes: Negative for pain and redness.  Respiratory: Negative for cough and shortness of breath.   Cardiovascular: Negative for chest pain, palpitations and leg swelling.  Gastrointestinal: Negative for abdominal pain, blood in stool, constipation, diarrhea, nausea and vomiting.  Genitourinary: Negative for dysuria.  Musculoskeletal: Negative for falls and myalgias.  Skin: Negative for rash.  Neurological: Negative for dizziness.  Psychiatric/Behavioral: Negative for depression. The patient is not nervous/anxious.       Past Medical History:  Diagnosis Date  . Allergic rhinitis   . Allergy   . Arthritis   . Asthma   . Chronic headache   . Diabetes mellitus   . DVT (deep venous thrombosis) ([place])   . Dyspnea   . Heart murmur   . Hypertension   . Pulmonary embolism ([place])   . Pulmonary hypertension ([place])   . Seizures ([place])     reports that she has never smoked. She has never used smokeless tobacco. She reports current  alcohol use. She reports that she does not use drugs.   Current Outpatient Medications:  .  acetaminophen-codeine (TYLENOL #3) 300-30 MG tablet, TAKE 1 TABLET BY MOUTH EVERY 12 HOURS AS NEEDED FOR UP TO 7 DAYS FOR MODERATE PAIN, Disp: 60 tablet, Rfl: 0 .  albuterol (VENTOLIN HFA) 108 (90 Base) MCG/ACT inhaler, INHALE 2 PUFFS BY MOUTH INTO THE LUNGS EVERY 6 HOURS AS NEEDED FOR WHEEZING, SHORTNESS OF BREATH,  Disp: 54 g, Rfl: 0 .  APPLE CIDER VINEGAR PO, Take 2 tablets by mouth 2 (two) times daily., Disp: , Rfl:  .  azelastine (ASTELIN) 0.1 % nasal spray, USE 1-2 SPRAYS IN EACH NOSTRIL TWICE DAILY AS NEEDED, Disp: 30 mL, Rfl: 5 .  Cholecalciferol (VITAMIN D3) 1.25 MG (50000 UT) CAPS, Take 1 capsule by mouth every 7 (seven) days., Disp: 12 capsule, Rfl: 0 .  clobetasol cream (TEMOVATE) 3.81 %, Apply 1 application topically 2 (two) times daily., Disp: , Rfl:  .  cyclobenzaprine (FLEXERIL) 10 MG tablet, Take 1 tablet (10 mg total) by mouth at bedtime as needed for muscle spasms., Disp: 30 tablet, Rfl: 1 .  diltiazem (CARTIA XT) 120 MG 24 hr capsule, Take 1 capsule (120 mg total) by mouth daily. KEEP OV., Disp: 30 capsule, Rfl: 2 .  DULoxetine (CYMBALTA) 30 MG capsule, TAKE 1 CAPSULE(30 MG) BY MOUTH DAILY, Disp: 30 capsule, Rfl: 5 .  EPINEPHrine 0.3 mg/0.3 mL IJ SOAJ injection, USE AS DIRECTED FOR LIFE THREATENING ALLERGIC REACTIONS, Disp: 2 Device, Rfl: 2 .  famotidine (PEPCID) 20 MG tablet, Take 1 tablet (20 mg total) by mouth 2 (two) times daily., Disp: 60 tablet, Rfl: 5 .  fluticasone (FLOVENT HFA) 220 MCG/ACT inhaler, Inhale 2 puffs into the lungs 2 (two) times daily as needed., Disp: 1 Inhaler, Rfl: 5 .  fluticasone-salmeterol (ADVAIR HFA) 230-21 MCG/ACT inhaler, Inhale two puffs twice daily to prevent cough or wheeze. Rinse mouth after use., Disp: 12 g, Rfl: 3 .  furosemide (LASIX) 40 MG tablet, TAKE 1 TABLET(40 MG) BY MOUTH DAILY, Disp: 30 tablet, Rfl: 3 .  gabapentin (NEURONTIN) 300 MG capsule, TAKE 2 CAPSULES(600 MG) BY MOUTH THREE TIMES DAILY, Disp: 180 capsule, Rfl: 1 .  GLYXAMBI 10-5 MG TABS, TAKE 1 TABLET BY MOUTH DAILY, Disp: 30 tablet, Rfl: 2 .  hydrOXYzine (ATARAX/VISTARIL) 10 MG tablet, TK 1 TO 2 TS PO HS, Disp: , Rfl: 1 .  ipratropium (ATROVENT) 0.06 % nasal spray, Place 1-2 sprays each nostril 1-2 times per day, Disp: 15 mL, Rfl: 5 .  irbesartan (AVAPRO) 150 MG tablet, TAKE 1 TABLET(150  MG) BY MOUTH DAILY, Disp: 30 tablet, Rfl: 9 .  Lactase (DAIRY-RELIEF PO), Take 1 capsule by mouth daily as needed (stomach upset). , Disp: , Rfl:  .  levalbuterol (XOPENEX) 1.25 MG/3ML nebulizer solution, Take 1.25 mg by nebulization every 6 (six) hours as needed for wheezing or shortness of breath., Disp: 1080 mL, Rfl: 1 .  Magnesium 250 MG TABS, Take 1 tablet by mouth daily., Disp: , Rfl:  .  methimazole (TAPAZOLE) 5 MG tablet, Take 2.5 mg daily by mouth, with meals, Disp: 30 tablet, Rfl: 5 .  montelukast (SINGULAIR) 10 MG tablet, TAKE 1 TABLET BY MOUTH EVERY DAY, Disp: 90 tablet, Rfl: 1 .  ONETOUCH DELICA LANCETS 01B MISC, as needed (blood check). , Disp: , Rfl:  .  ONETOUCH VERIO test strip, 1 each by Other route as needed (blood monitoring). , Disp: , Rfl:  .  pantoprazole (PROTONIX) 40 MG  tablet, TAKE 1 TABLET BY MOUTH EVERY DAY, Disp: 90 tablet, Rfl: 1 .  PATADAY 0.2 % SOLN, INT 1 GTT IN OU QD PRN, Disp: , Rfl: 4 .  polyethylene glycol (MIRALAX / GLYCOLAX) packet, Take 17 g by mouth daily., Disp: , Rfl:  .  Potassium 99 MG TABS, Take 99 mg by mouth daily., Disp: , Rfl:  .  Probiotic Product (ALIGN) 4 MG CAPS, Take 1 capsule by mouth daily., Disp: , Rfl:  .  SPIRIVA RESPIMAT 1.25 MCG/ACT AERS, INHALE 2 PUFFS BY MOUTH EVERY DAY, Disp: 4 g, Rfl: 5 .  SUMAtriptan (IMITREX) 100 MG tablet, Take 100 mg x 1 may, repeat in 2 hour for migraine., Disp: 10 tablet, Rfl: 0 .  tolterodine (DETROL LA) 4 MG 24 hr capsule, Take 4 mg by mouth daily., Disp: , Rfl:  .  TRULANCE 3 MG TABS, Take 1 tablet by mouth daily. , Disp: , Rfl:  .  XARELTO 20 MG TABS tablet, TAKE 1 TABLET(20 MG) BY MOUTH DAILY WITH SUPPER, Disp: 90 tablet, Rfl: 1  Current Facility-Administered Medications:  .  omalizumab Arvid Right) injection 300 mg, 300 mg, Subcutaneous, Q28 days, Kozlow, Donnamarie Poag, MD, 300 mg at 12/02/18 1005   Observations/Objective: Height 5\' 5"  (1.651 m), weight 229 lb 6 oz (104 kg).  Physical Exam  Physical  Exam Constitutional:      General: The patient is not in acute distress. Pulmonary:     Effort: Pulmonary effort is normal. No respiratory distress.  Neurological:     Mental Status: The patient is alert and oriented to person, place, and time.  Psychiatric:        Mood and Affect: Mood normal.        Behavior: Behavior normal.   Assessment and Plan Chronic pain On gabapentin (600/600/900), flexeril.  On cymbalta for chronic pain.  Requests a refill of tylenol #3.Marland Kitchen last RX in 05/2018  Last UDS 04/01/2018 Medication and dose: tyelnol # 3 1 po every 6 hours for pain.. uses prn # pills per month: ,30.Marland Kitchen last refill # 60 lasted > 6 months Last UDS date: 03/2018 Opioid Treatment Agreement signed (Y/N): y Opioid Treatment Agreement last reviewed with patient:   today verbally [place] reviewed this encounter (include red flags): Yes    Chronic low back pain without sciatica Mild worsening from inactivity and no longer able to afford chiropractor. On cymbalta, gabapentin.  Can restart flexeril prn.  Rx for tylenol #3 sent in.  Fatigue Neg lab eval and no better after vit D supplementation.  likely multifactorial due to sedating medications, limit as able  AND decreased activity.  Restart exercise as able.  Denies depression.     I discussed the assessment and treatment plan with the patient. The patient was provided an opportunity to ask questions and all were answered. The patient agreed with the plan and demonstrated an understanding of the instructions.   The patient was advised to call back or seek an in-person evaluation if the symptoms worsen or if the condition fails to improve as anticipated.     [name], MD

## 2018-12-14 ENCOUNTER — Other Ambulatory Visit: Payer: Self-pay | Admitting: Allergy and Immunology

## 2018-12-23 ENCOUNTER — Other Ambulatory Visit: Payer: Self-pay

## 2018-12-23 ENCOUNTER — Encounter: Payer: Self-pay | Admitting: Podiatry

## 2018-12-23 ENCOUNTER — Ambulatory Visit (INDEPENDENT_AMBULATORY_CARE_PROVIDER_SITE_OTHER): Payer: Medicare Other | Admitting: Podiatry

## 2018-12-23 VITALS — Temp 97.7°F

## 2018-12-23 DIAGNOSIS — E1142 Type 2 diabetes mellitus with diabetic polyneuropathy: Secondary | ICD-10-CM | POA: Diagnosis not present

## 2018-12-23 DIAGNOSIS — R350 Frequency of micturition: Secondary | ICD-10-CM | POA: Diagnosis not present

## 2018-12-23 MED ORDER — GABAPENTIN 300 MG PO CAPS: 300.0000 mg | ORAL_CAPSULE | Freq: Every day | ORAL | 3 refills | Status: DC

## 2018-12-23 MED ORDER — GABAPENTIN 600 MG PO TABS: ORAL_TABLET | ORAL | 3 refills | Status: DC

## 2018-12-23 NOTE — Progress Notes (Signed)
She presents today for follow-up for her diabetic peripheral neuropathy she is taking 600 mg of gabapentin in the morning lunchtime and 900 at bedtime.  States that it may be doing a little bit better but not significantly.  Objective: Vital signs stable alert and oriented x3 no open lesions or wounds pulses remain palpable no signs of infection.  No change in neuropathic evaluation.  Assessment: Diabetic peripheral neuropathy.  Plan: We will continue medication as is and we will await her diabetic shoes I will follow-up with her in 2 months

## 2018-12-29 DIAGNOSIS — J4550 Severe persistent asthma, uncomplicated: Secondary | ICD-10-CM | POA: Diagnosis not present

## 2018-12-30 ENCOUNTER — Ambulatory Visit (INDEPENDENT_AMBULATORY_CARE_PROVIDER_SITE_OTHER): Payer: Medicare Other | Admitting: *Deleted

## 2018-12-30 ENCOUNTER — Other Ambulatory Visit: Payer: Self-pay

## 2018-12-30 DIAGNOSIS — H25013 Cortical age-related cataract, bilateral: Secondary | ICD-10-CM | POA: Diagnosis not present

## 2018-12-30 DIAGNOSIS — H2513 Age-related nuclear cataract, bilateral: Secondary | ICD-10-CM | POA: Diagnosis not present

## 2018-12-30 DIAGNOSIS — J4550 Severe persistent asthma, uncomplicated: Secondary | ICD-10-CM

## 2018-12-30 DIAGNOSIS — N3941 Urge incontinence: Secondary | ICD-10-CM | POA: Diagnosis not present

## 2018-12-30 DIAGNOSIS — H40013 Open angle with borderline findings, low risk, bilateral: Secondary | ICD-10-CM | POA: Diagnosis not present

## 2018-12-30 DIAGNOSIS — E113292 Type 2 diabetes mellitus with mild nonproliferative diabetic retinopathy without macular edema, left eye: Secondary | ICD-10-CM | POA: Diagnosis not present

## 2018-12-30 LAB — HM DIABETES EYE EXAM

## 2018-12-31 ENCOUNTER — Telehealth: Payer: Self-pay | Admitting: *Deleted

## 2018-12-31 DIAGNOSIS — K5901 Slow transit constipation: Secondary | ICD-10-CM | POA: Diagnosis not present

## 2018-12-31 DIAGNOSIS — R1013 Epigastric pain: Secondary | ICD-10-CM | POA: Diagnosis not present

## 2018-12-31 DIAGNOSIS — R143 Flatulence: Secondary | ICD-10-CM | POA: Diagnosis not present

## 2018-12-31 NOTE — Telephone Encounter (Signed)
PA received for pantoprazole done in covermymeds @ 2:01pm

## 2019-01-01 NOTE — Telephone Encounter (Signed)
Went on covermymeds it was PA approved awaiting letter confirmation

## 2019-01-02 ENCOUNTER — Encounter: Payer: Self-pay | Admitting: Family Medicine

## 2019-01-02 ENCOUNTER — Telehealth: Payer: Self-pay | Admitting: Family Medicine

## 2019-01-02 NOTE — Telephone Encounter (Signed)
Patient is requesting a call back from the nurse to discuss the sore throat/    Patient stated she is expercing a sore throat. The Endo office advised her to call our office because she needed to discuss if this could possibly be her thyroid or other.  Would you like [place] to just get her scheduled for a virtual and what would be a good time she is wanting to have this addressed today and the schedules are pretty full  Thanks!   Patient C/B # [phone]

## 2019-01-02 NOTE — Telephone Encounter (Signed)
She needs virtual... if need to be seem ASAP.. make next week/Monday with Dr. Lorelei Pont or tommorow at [facility].  I am overwhelmed today now and cannot add more on.  I will be put of town until the following week.

## 2019-01-05 ENCOUNTER — Ambulatory Visit (INDEPENDENT_AMBULATORY_CARE_PROVIDER_SITE_OTHER): Payer: Medicare Other | Admitting: Family Medicine

## 2019-01-05 ENCOUNTER — Encounter: Payer: Self-pay | Admitting: Family Medicine

## 2019-01-05 VITALS — Temp 97.5°F | Ht 65.0 in | Wt 225.2 lb

## 2019-01-05 DIAGNOSIS — K1120 Sialoadenitis, unspecified: Secondary | ICD-10-CM | POA: Diagnosis not present

## 2019-01-05 MED ORDER — FLUCONAZOLE 150 MG PO TABS: ORAL_TABLET | ORAL | 0 refills | Status: DC

## 2019-01-05 MED ORDER — CLINDAMYCIN HCL 300 MG PO CAPS: 300.0000 mg | ORAL_CAPSULE | Freq: Three times a day (TID) | ORAL | 0 refills | Status: DC

## 2019-01-05 NOTE — Progress Notes (Signed)
[name] T. Dimitri Dsouza, MD Primary Care and [place] at [facility] [name], [address] Phone: [phone]  FAX: [place] - 73 y.o. female  MRN [id number]  Date of Birth: 06/23/1946  Visit Date: 01/05/2019  PCP: [name], MD  Referred by: [name], MD Chief Complaint  Patient presents with  . Sore Throat  . Jaw Pain   Virtual Visit via Video Note:  I connected with  CIERRAH DACE on 01/05/2019 12:00 PM EDT by a video enabled telemedicine application and verified that I am speaking with the correct person using two identifiers.   Location patient: home computer, tablet, or smartphone Location provider: work or home office Consent: Verbal consent directly obtained from [facility]. Persons participating in the virtual visit: patient, provider  I discussed the limitations of evaluation and management by telemedicine and the availability of in person appointments. The patient expressed understanding and agreed to proceed.  Interactive audio and video telecommunications were attempted between this provider and patient, however failed, due to patient having technical difficulties OR patient did not have access to video capability.  We continued and completed visit with audio only.    History of Present Illness:  Throat and chin under the right side.  Dm doctor - ? Call us.  Uses new cpap in January.   Corner of jaw.  On further questioning the patient indicates that this pain is underneath the angle of the jaw.  It is tender to palpation.  There is no redness, swelling or warmth appreciated.  She describes some pain of the throat but it is mostly underneath the chin and underneath the bone.  She notes no adenopathy, and she is not having a fever.  Review of Systems as above: See pertinent positives and pertinent negatives per HPI No acute distress verbally  Past Medical History, Surgical  History, Social History, Family History, Problem List, Medications, and Allergies have been reviewed and updated if relevant.   Observations/Objective/Exam:  An attempt was made to discern vital signs over the phone and per patient if applicable and possible.   General:    Alert, Oriented, appears well and in no acute distress HEENT:     Atraumatic, conjunctiva clear, no obvious abnormalities on inspection of external nose and ears.  Neck:    Normal movements of the head and neck Pulmonary:     On inspection no signs of respiratory distress, breathing rate appears normal, no obvious gross SOB, gasping or wheezing Cardiovascular:    No obvious cyanosis Musculoskeletal:    Moves all visible extremities without noticeable abnormality Psych / Neurological:     Pleasant and cooperative, no obvious depression or anxiety, speech and thought processing grossly intact  Assessment and Plan:    ICD-10-CM   1. Sialadenitis  K11.20    >15 minutes spent in face to face time with patient, >50% spent in counselling or coordination of care  Most likely diagnosis, but difficult to assess without an examination.  I will treat as such.  If her symptoms persist then she will need to have a face-to-face evaluation.  I discussed the assessment and treatment plan with the patient. The patient was provided an opportunity to ask questions and all were answered. The patient agreed with the plan and demonstrated an understanding of the instructions.   The patient was advised to call back or seek an in-person evaluation if the symptoms worsen  or if the condition fails to improve as anticipated.  Follow-up: prn unless noted otherwise below No follow-ups on file.  Meds ordered this encounter  Medications  . fluconazole (DIFLUCAN) 150 MG tablet    Sig: Take 1 tab po today, repeat in 7 days if needed    Dispense:  2 tablet    Refill:  0  . clindamycin (CLEOCIN) 300 MG capsule    Sig: Take 1 capsule (300 mg  total) by mouth 3 (three) times daily.    Dispense:  30 capsule    Refill:  0   No orders of the defined types were placed in this encounter.   Signed,  Maud Deed. Stormy Sabol, MD

## 2019-01-06 DIAGNOSIS — Z803 Family history of malignant neoplasm of breast: Secondary | ICD-10-CM | POA: Diagnosis not present

## 2019-01-06 DIAGNOSIS — N3941 Urge incontinence: Secondary | ICD-10-CM | POA: Diagnosis not present

## 2019-01-06 DIAGNOSIS — Z1231 Encounter for screening mammogram for malignant neoplasm of breast: Secondary | ICD-10-CM | POA: Diagnosis not present

## 2019-01-06 LAB — HM MAMMOGRAPHY

## 2019-01-06 NOTE — Telephone Encounter (Signed)
Approval notification has been received and faxed to pharmacy. I am also sending this to be scanned to chart.

## 2019-01-09 DIAGNOSIS — M5136 Other intervertebral disc degeneration, lumbar region: Secondary | ICD-10-CM | POA: Diagnosis not present

## 2019-01-13 ENCOUNTER — Ambulatory Visit: Payer: Medicare Other | Admitting: Orthotics

## 2019-01-14 ENCOUNTER — Encounter: Payer: Self-pay | Admitting: Family Medicine

## 2019-01-20 DIAGNOSIS — M545 Low back pain: Secondary | ICD-10-CM | POA: Diagnosis not present

## 2019-01-22 ENCOUNTER — Other Ambulatory Visit: Payer: Self-pay

## 2019-01-22 ENCOUNTER — Ambulatory Visit (INDEPENDENT_AMBULATORY_CARE_PROVIDER_SITE_OTHER): Payer: Medicare Other | Admitting: Orthotics

## 2019-01-22 DIAGNOSIS — E1142 Type 2 diabetes mellitus with diabetic polyneuropathy: Secondary | ICD-10-CM | POA: Diagnosis not present

## 2019-01-22 DIAGNOSIS — M2041 Other hammer toe(s) (acquired), right foot: Secondary | ICD-10-CM

## 2019-01-22 DIAGNOSIS — M79676 Pain in unspecified toe(s): Secondary | ICD-10-CM

## 2019-01-22 DIAGNOSIS — M2042 Other hammer toe(s) (acquired), left foot: Secondary | ICD-10-CM | POA: Diagnosis not present

## 2019-01-22 DIAGNOSIS — B351 Tinea unguium: Secondary | ICD-10-CM

## 2019-01-22 NOTE — Progress Notes (Signed)

## 2019-01-27 ENCOUNTER — Ambulatory Visit (INDEPENDENT_AMBULATORY_CARE_PROVIDER_SITE_OTHER): Payer: Medicare Other | Admitting: Allergy and Immunology

## 2019-01-27 ENCOUNTER — Ambulatory Visit: Payer: Self-pay

## 2019-01-27 ENCOUNTER — Other Ambulatory Visit: Payer: Self-pay

## 2019-01-27 ENCOUNTER — Encounter: Payer: Self-pay | Admitting: Allergy and Immunology

## 2019-01-27 VITALS — BP 126/62 | HR 116 | Resp 16 | Ht 65.0 in

## 2019-01-27 DIAGNOSIS — J0100 Acute maxillary sinusitis, unspecified: Secondary | ICD-10-CM | POA: Diagnosis not present

## 2019-01-27 DIAGNOSIS — J3089 Other allergic rhinitis: Secondary | ICD-10-CM

## 2019-01-27 DIAGNOSIS — J4550 Severe persistent asthma, uncomplicated: Secondary | ICD-10-CM

## 2019-01-27 DIAGNOSIS — R350 Frequency of micturition: Secondary | ICD-10-CM | POA: Diagnosis not present

## 2019-01-27 DIAGNOSIS — N3941 Urge incontinence: Secondary | ICD-10-CM | POA: Diagnosis not present

## 2019-01-27 MED ORDER — ADVAIR HFA 230-21 MCG/ACT IN AERO: INHALATION_SPRAY | RESPIRATORY_TRACT | 3 refills | Status: DC

## 2019-01-27 MED ORDER — EPINEPHRINE 0.3 MG/0.3ML IJ SOAJ: INTRAMUSCULAR | 2 refills | Status: DC

## 2019-01-27 MED ORDER — SPIRIVA RESPIMAT 1.25 MCG/ACT IN AERS: 2.0000 | INHALATION_SPRAY | Freq: Every day | RESPIRATORY_TRACT | 5 refills | Status: DC

## 2019-01-27 MED ORDER — PANTOPRAZOLE SODIUM 40 MG PO TBEC: 40.0000 mg | DELAYED_RELEASE_TABLET | Freq: Every day | ORAL | 1 refills | Status: DC

## 2019-01-27 MED ORDER — AZELASTINE HCL 0.1 % NA SOLN: NASAL | 5 refills | Status: DC

## 2019-01-27 MED ORDER — FAMOTIDINE 40 MG PO TABS: 40.0000 mg | ORAL_TABLET | Freq: Every day | ORAL | 1 refills | Status: DC

## 2019-01-27 NOTE — Progress Notes (Signed)
[place] [facility] [place] [facility] [place]   Follow-up Note  Referring Provider: [name], MD Primary Provider: [name], MD Date of Office Visit: 01/27/2019  Subjective:   [name] (DOB: 1946-07-07) is a 73 y.o. female who returns to the [place] on 01/27/2019 in re-evaluation of the following:  HPI: [name] returns to this clinic in reevaluation of asthma and allergic rhinitis and LPR and a history of pulmonary hypertension, diastolic dysfunction, and sleep apnea.  Her last visit to this clinic was 18 Nov 2018.  She did develop some issues with nasal congestion and some postnasal drip and nasal itching recently.  Otherwise, she has done very well with her airway.  She rarely uses a short acting bronchodilator averaging out to less than 1 time per week.  She does not have any issues that have required her to get a systemic steroid or an antibiotic during the interval.  She continues to use a large collection of medical therapy directed against respiratory tract inflammation including the use of omalizumab.  Her reflux appears to be going relatively well at this point.  She has been using a combination of her proton pump inhibitor and H2 receptor blocker.  She continues on CPAP for her sleep apnea.  Allergies as of 01/27/2019      Reactions   Latex Hives   Penicillins Swelling   Has patient had a PCN reaction causing immediate rash, facial/tongue/throat swelling, SOB or lightheadedness with hypotension patient had a PCN reaction causing severe rash involving mucus membranes or skin necrosis: [id number] Has patient had a PCN reaction that required hospitalization/No Has patient had a PCN reaction occurring within the last 10 years: No If all of the above answers are "NO", then may proceed with Cephalosporin use.   Lyrica [pregabalin] Other (See Comments)   Blurred vision.   Phenytoin Sodium Extended Swelling   Swelling    Vicodin [hydrocodone-acetaminophen] Nausea And Vomiting      Medication List      acetaminophen-codeine 300-30 MG tablet Commonly known as: TYLENOL #3 TAKE 1 TABLET BY MOUTH EVERY 12 HOURS AS NEEDED FOR UP TO 7 DAYS FOR MODERATE PAIN   albuterol 108 (90 Base) MCG/ACT inhaler Commonly known as: VENTOLIN HFA INHALE 2 PUFFS BY MOUTH INTO THE LUNGS EVERY 6 HOURS AS NEEDED FOR WHEEZING, SHORTNESS OF BREATH   Align 4 MG Caps Take 1 capsule by mouth daily.   APPLE CIDER VINEGAR PO Take 2 tablets by mouth 2 (two) times daily.   azelastine 0.1 % nasal spray Commonly known as: Astelin USE 1-2 SPRAYS IN EACH NOSTRIL TWICE DAILY AS NEEDED   clindamycin 300 MG capsule Commonly known as: CLEOCIN Take 1 capsule (300 mg total) by mouth 3 (three) times daily.   clobetasol cream 0.05 % Commonly known as: TEMOVATE Apply 1 application topically 2 (two) times daily.   cyclobenzaprine 10 MG tablet Commonly known as: FLEXERIL Take 1 tablet (10 mg total) by mouth at bedtime as needed for muscle spasms.   DAIRY-RELIEF PO Take 1 capsule by mouth daily as needed (stomach upset).   diltiazem 120 MG 24 hr capsule Commonly known as: Cartia XT Take 1 capsule (120 mg total) by mouth daily. KEEP OV.   DULoxetine 30 MG capsule Commonly known as: CYMBALTA TAKE 1 CAPSULE(30 MG) BY MOUTH DAILY   EPINEPHrine 0.3 mg/0.3 mL Soaj injection Commonly known as: EPI-PEN USE AS DIRECTED FOR LIFE THREATENING ALLERGIC REACTIONS  famotidine 20 MG tablet Commonly known as: Pepcid Take 1 tablet (20 mg total) by mouth 2 (two) times daily.   fluconazole 150 MG tablet Commonly known as: DIFLUCAN Take 1 tab po today, repeat in 7 days if needed   fluticasone 220 MCG/ACT inhaler Commonly known as: Flovent HFA Inhale 2 puffs into the lungs 2 (two) times daily as needed.   fluticasone-salmeterol 230-21 MCG/ACT inhaler Commonly known as: Advair HFA Inhale two puffs twice daily to prevent cough or wheeze. Rinse  mouth after use.   furosemide 40 MG tablet Commonly known as: LASIX TAKE 1 TABLET(40 MG) BY MOUTH DAILY   gabapentin 300 MG capsule Commonly known as: NEURONTIN Take 1 capsule (300 mg total) by mouth at bedtime.   gabapentin 600 MG tablet Commonly known as: Neurontin One AM, one at lunch, one at bedtime (at bedtime take 600mg  and 300mg  tablet)   Glyxambi 10-5 MG Tabs Generic drug: Empagliflozin-linaGLIPtin TAKE 1 TABLET BY MOUTH DAILY   hydrOXYzine 10 MG tablet Commonly known as: ATARAX/VISTARIL TK 1 TO 2 TS PO HS   ipratropium 0.06 % nasal spray Commonly known as: ATROVENT Place 1-2 sprays each nostril 1-2 times per day   irbesartan 150 MG tablet Commonly known as: AVAPRO TAKE 1 TABLET(150 MG) BY MOUTH DAILY   levalbuterol 1.25 MG/3ML nebulizer solution Commonly known as: XOPENEX Take 1.25 mg by nebulization every 6 (six) hours as needed for wheezing or shortness of breath.   Magnesium 250 MG Tabs Take 1 tablet by mouth daily.   methimazole 5 MG tablet Commonly known as: TAPAZOLE Take 2.5 mg daily by mouth, with meals   montelukast 10 MG tablet Commonly known as: SINGULAIR TAKE 1 TABLET BY MOUTH EVERY DAY   OneTouch Delica Lancets 67Y Misc as needed (blood check).   OneTouch Verio test strip Generic drug: glucose blood 1 each by Other route as needed (blood monitoring).   pantoprazole 40 MG tablet Commonly known as: PROTONIX TAKE 1 TABLET BY MOUTH EVERY DAY   Pataday 0.2 % Soln Generic drug: Olopatadine HCl INT 1 GTT IN OU QD PRN   polyethylene glycol 17 g packet Commonly known as: MIRALAX / GLYCOLAX Take 17 g by mouth daily.   Potassium 99 MG Tabs Take 99 mg by mouth daily.   Spiriva Respimat 1.25 MCG/ACT Aers Generic drug: Tiotropium Bromide Monohydrate INHALE 2 PUFFS BY MOUTH EVERY DAY   SUMAtriptan 100 MG tablet Commonly known as: IMITREX Take 100 mg x 1 may, repeat in 2 hour for migraine.   tolterodine 4 MG 24 hr capsule Commonly known  as: DETROL LA Take 4 mg by mouth daily.   Trulance 3 MG Tabs Generic drug: Plecanatide Take 1 tablet by mouth daily.   Vitamin D3 1.25 MG (50000 UT) Caps Take 1 capsule by mouth every 7 (seven) days.   Xarelto 20 MG Tabs tablet Generic drug: rivaroxaban TAKE 1 TABLET(20 MG) BY MOUTH DAILY WITH SUPPER       Past Medical History:  Diagnosis Date  . Allergic rhinitis   . Allergy   . Arthritis   . Asthma   . Chronic headache   . Diabetes mellitus   . DVT (deep venous thrombosis) ([place])   . Dyspnea   . Heart murmur   . Hypertension   . Pulmonary embolism ([place])   . Pulmonary hypertension ([place])   . Seizures ([place])     Past Surgical History:  Procedure Laterality Date  . ABDOMINAL HYSTERECTOMY     partial,  has ovaries  . CARDIAC CATHETERIZATION  12/28/2010   Mod. pulmonary hypertension, normal coronary arteries  . DOPPLER ECHOCARDIOGRAPHY  10/08/2011   EF=>55%,mild asymmetric LVH, mod. TR, mod. PH, mild to mod LA dilatation  . KNEE ARTHROSCOPY Left   . KNEE SURGERY    . Nuclear Stress Test  05/20/2006   No ischemia  . PARTIAL HYSTERECTOMY    . PLANTAR FASCIA SURGERY    . TONSILLECTOMY      Review of systems negative except as noted in HPI / PMHx or noted below:  Review of Systems  Constitutional: Negative.   HENT: Negative.   Eyes: Negative.   Respiratory: Negative.   Cardiovascular: Negative.   Gastrointestinal: Negative.   Genitourinary: Negative.   Musculoskeletal: Negative.   Skin: Negative.   Neurological: Negative.   Endo/Heme/Allergies: Negative.   Psychiatric/Behavioral: Negative.      Objective:   Vitals:   01/27/19 1129  BP: 126/62  Pulse: (!) 116  Resp: 16  SpO2: 96%   Height: 5\' 5"  (165.1 cm)      Physical Exam Constitutional:      Appearance: She is not diaphoretic.  HENT:     Head: Normocephalic.     Right Ear: Tympanic membrane, ear canal and external ear normal.     Left Ear: Tympanic membrane, ear canal and external ear  normal.     Nose: Nose normal. No mucosal edema or rhinorrhea.     Mouth/Throat:     Pharynx: Uvula midline. No oropharyngeal exudate.  Eyes:     Conjunctiva/sclera: Conjunctivae normal.  Neck:     Thyroid: No thyromegaly.     Trachea: Trachea normal. No tracheal tenderness or tracheal deviation.  Cardiovascular:     Rate and Rhythm: Normal rate and regular rhythm.     Heart sounds: Normal heart sounds, S1 normal and S2 normal. No murmur.  Pulmonary:     Effort: No respiratory distress.     Breath sounds: Normal breath sounds. No stridor. No wheezing or rales.  Lymphadenopathy:     Head:     Right side of head: No tonsillar adenopathy.     Left side of head: No tonsillar adenopathy.     Cervical: No cervical adenopathy.  Skin:    Findings: No erythema or rash.     Nails: There is no clubbing.   Neurological:     Mental Status: She is alert.     Diagnostics:    Spirometry was performed and demonstrated an FEV1 of 1.23 at 66 % of predicted.  Assessment and Plan:   1. Asthma, severe persistent, well-controlled   2. Acute maxillary sinusitis, recurrence not specified   3. Other allergic rhinitis     1. Continue Advair 230 - 2 inhalations twice a day  2. Continue Spiriva 2.5 respimat - two inhalations one time per day.  3. Continue Montelukast 10mg  - one tablet one time per day.  4. Continue Xolair and Epi-Pen  5. Continue pantoprazole 40mg  in the AM and Famotidine 40 mg in the PM  6. If needed:   A. Nasal saline several times per day  B. Mucinex DM two times per day  C. nasal azelastine two sprays each nostril 1-2 times per day  D. Ipratropium 0.06% -1-2 sprays each nostril 1-2 times per day to dry nose  E. Proventil HFA or albuterol nebulization  7. Add Flovent 220 - 2 inhalations 2 times a day during "FLARE UP"  8. Return to clinic in 6 months  or earlier if there is a problem.  9. Obtain fall flu vaccine (and COVID vaccine)  Shelagh appears to be doing  quite well on her current therapy other than the fact that she has some rhinitis and I have encouraged her to use her nasal antihistamine spray during these timeframes.  She will remain on a large collection of anti-inflammatory agents for her airway and therapy directed against reflux and I will see her back in this clinic in 6 months or earlier if there is a problem.  Allena Katz, MD Allergy / Immunology [place]

## 2019-01-27 NOTE — Patient Instructions (Signed)
  1. Continue Advair 230 - 2 inhalations twice a day  2. Continue Spiriva 2.5 respimat - two inhalations one time per day.  3. Continue Montelukast 10mg  - one tablet one time per day.  4. Continue Xolair and Epi-Pen  5. Continue pantoprazole 40mg  in the AM and Famotidine 40 mg in the PM  6. If needed:   A. Nasal saline several times per day  B. Mucinex DM two times per day  C. nasal azelastine two sprays each nostril 1-2 times per day  D. Ipratropium 0.06% -1-2 sprays each nostril 1-2 times per day to dry nose  E. Proventil HFA or albuterol nebulization  7. Add Flovent 220 - 2 inhalations 2 times a day during "FLARE UP"  8. Return to clinic in 6 months or earlier if there is a problem.  9. Obtain fall flu vaccine (and COVID vaccine)

## 2019-01-28 ENCOUNTER — Encounter: Payer: Self-pay | Admitting: Allergy and Immunology

## 2019-02-03 ENCOUNTER — Encounter: Payer: Self-pay | Admitting: Podiatry

## 2019-02-03 ENCOUNTER — Other Ambulatory Visit: Payer: Self-pay

## 2019-02-03 ENCOUNTER — Ambulatory Visit (INDEPENDENT_AMBULATORY_CARE_PROVIDER_SITE_OTHER): Payer: Medicare Other | Admitting: Podiatry

## 2019-02-03 VITALS — Temp 98.1°F

## 2019-02-03 DIAGNOSIS — R350 Frequency of micturition: Secondary | ICD-10-CM | POA: Diagnosis not present

## 2019-02-03 DIAGNOSIS — B351 Tinea unguium: Secondary | ICD-10-CM

## 2019-02-03 DIAGNOSIS — M79676 Pain in unspecified toe(s): Secondary | ICD-10-CM | POA: Diagnosis not present

## 2019-02-03 DIAGNOSIS — Q828 Other specified congenital malformations of skin: Secondary | ICD-10-CM

## 2019-02-03 DIAGNOSIS — E1142 Type 2 diabetes mellitus with diabetic polyneuropathy: Secondary | ICD-10-CM | POA: Diagnosis not present

## 2019-02-03 DIAGNOSIS — R351 Nocturia: Secondary | ICD-10-CM | POA: Diagnosis not present

## 2019-02-03 NOTE — Progress Notes (Signed)
She presents today for follow-up of painful elongated toenails corns and calluses and neuropathy.  States that I am taking 600 mg twice during the day and 900 mg at night.  I am doing pretty well with that.  My toe is a little bit sore where it seems to rub in a shoe as she refers to the hallux left with her new diabetic shoes.  Objective: Vital signs are stable she is alert and oriented x3.  Pulses are palpable.  Pulses are palpable.  Toenails are long thick yellow dystrophic-like mycotic reactive hyper keratomas distal aspect of the toe as well as plantar forefoot.  Assessment: Diabetic peripheral neuropathy with pain in limb secondary to onychomycosis and reactive hyperkeratosis.  Plan: Debridement of all reactive hyperkeratotic tissue debridement of toenails 1 through 5 bilateral.  She will make an appointment to follow-up with Liliane Channel for her diabetic shoes evaluating the diabetic shoes it appears that they are small and rubbing the distal aspect of her toe which will result in ulceration eventually.

## 2019-02-04 ENCOUNTER — Ambulatory Visit: Payer: Medicare Other | Admitting: Orthotics

## 2019-02-04 DIAGNOSIS — Q828 Other specified congenital malformations of skin: Secondary | ICD-10-CM

## 2019-02-04 DIAGNOSIS — M5136 Other intervertebral disc degeneration, lumbar region: Secondary | ICD-10-CM | POA: Diagnosis not present

## 2019-02-04 DIAGNOSIS — E1142 Type 2 diabetes mellitus with diabetic polyneuropathy: Secondary | ICD-10-CM

## 2019-02-04 DIAGNOSIS — M1711 Unilateral primary osteoarthritis, right knee: Secondary | ICD-10-CM | POA: Diagnosis not present

## 2019-02-04 DIAGNOSIS — M79672 Pain in left foot: Secondary | ICD-10-CM

## 2019-02-04 NOTE — Progress Notes (Signed)
Adjust foot orthotic to give toes more clearance.

## 2019-02-05 ENCOUNTER — Other Ambulatory Visit: Payer: Self-pay | Admitting: Allergy and Immunology

## 2019-02-09 ENCOUNTER — Other Ambulatory Visit: Payer: Self-pay | Admitting: Allergy and Immunology

## 2019-02-10 DIAGNOSIS — M5136 Other intervertebral disc degeneration, lumbar region: Secondary | ICD-10-CM | POA: Diagnosis not present

## 2019-02-10 DIAGNOSIS — M1711 Unilateral primary osteoarthritis, right knee: Secondary | ICD-10-CM | POA: Diagnosis not present

## 2019-02-18 ENCOUNTER — Telehealth: Payer: Self-pay | Admitting: Family Medicine

## 2019-02-18 DIAGNOSIS — M1711 Unilateral primary osteoarthritis, right knee: Secondary | ICD-10-CM | POA: Diagnosis not present

## 2019-02-18 DIAGNOSIS — M5136 Other intervertebral disc degeneration, lumbar region: Secondary | ICD-10-CM | POA: Diagnosis not present

## 2019-02-18 NOTE — Telephone Encounter (Signed)
Patient called and said she had some questions to ask [name] about her sugar.  I asked if she would talk to Triage, but she said she wants to talk to [name].

## 2019-02-18 NOTE — Telephone Encounter (Signed)
Spoke with [name].  She states she is having problems with Dry Skin and Dry Mouth.  She drinks plenty of water daily.  Her blood sugars having been running good.  Offered her an office visit with Dr. Ashley Mariner.  Neg Covid Screen.  Appointment scheduled for 01/19/2019 at 10:20 am.

## 2019-02-19 ENCOUNTER — Other Ambulatory Visit: Payer: Self-pay

## 2019-02-19 ENCOUNTER — Encounter: Payer: Self-pay | Admitting: Family Medicine

## 2019-02-19 ENCOUNTER — Ambulatory Visit (INDEPENDENT_AMBULATORY_CARE_PROVIDER_SITE_OTHER): Payer: Medicare Other | Admitting: Family Medicine

## 2019-02-19 VITALS — Ht 65.0 in | Wt 226.0 lb

## 2019-02-19 DIAGNOSIS — R682 Dry mouth, unspecified: Secondary | ICD-10-CM | POA: Diagnosis not present

## 2019-02-19 DIAGNOSIS — E1140 Type 2 diabetes mellitus with diabetic neuropathy, unspecified: Secondary | ICD-10-CM | POA: Diagnosis not present

## 2019-02-19 DIAGNOSIS — N3941 Urge incontinence: Secondary | ICD-10-CM | POA: Diagnosis not present

## 2019-02-19 NOTE — Assessment & Plan Note (Signed)
Improved with oxybutynin ER.. pt wishes to continue despite SE of dry skin and mouth.

## 2019-02-19 NOTE — Patient Instructions (Signed)
Moisturize skin, Keep up with fluids. Continue oxybutynin ER, if urinary symptoms.

## 2019-02-19 NOTE — Assessment & Plan Note (Signed)
Likely due top oybutynin ER but continue as it has helped OAB a lot.

## 2019-02-19 NOTE — Assessment & Plan Note (Addendum)
Good control on current regimen. No sign of dehydration.

## 2019-02-19 NOTE — Progress Notes (Signed)
VIRTUAL VISIT Due to national recommendations of social distancing due to [place] 19, a virtual visit is felt to be most appropriate for this patient at this time.   I connected with the patient on 02/19/19 at 10:20 AM EDT by virtual telehealth platform and verified that I am speaking with the correct person using two identifiers.   Interactive audio and video telecommunications were attempted between this provider and patient, however failed, due to patient having technical difficulties OR patient did not have access to video capability.  We continued and completed visit with audio only.   Virtual Visit via Telephone Note  I connected with patient on 02/19/19  at 11:14 AM  by telephone and verified that I am speaking with the correct person using two identifiers.  Interactive audio and video telecommunications were attempted between this provider and patient, however failed, due to patient having technical difficulties OR patient did not have access to video capability.  We continued and completed visit with audio only.   I discussed the limitations, risks, security and privacy concerns of performing an evaluation and management service by  virtual telehealth platform and the availability of in person appointments. I also discussed with the patient that there may be a patient responsible charge related to this service. The patient expressed understanding and agreed to proceed.  Patient location: Home Provider Location: [place] Participants: [name] and [name]   Chief Complaint  Patient presents with  . Dry Mouth and Dry Skin    Started last week and getting worse. Started taking oxybutynin er 02-03-19.    History of Present Illness:  73 year old female presents with new onset dry mouth.. ongoing for 2 weeks, worse in last week.  Recent new start med is oxybutynin for OAB.  IN chart.. noted had similar SE to vesicare in 2018.   OAB: SE to vesicare and myrbetriq,  detrol OLA ineffective. Oxybutynin has helped a lot.N She does not wish to stop this med.   DM  FBS 90-120  COVID 19 screen No recent travel or known exposure to COVID19 The patient denies respiratory symptoms of COVID 19 at this time.  The importance of social distancing was discussed today.   Review of Systems  Constitutional: Negative for chills and fever.  HENT: Negative for congestion and ear pain.   Eyes: Negative for pain and redness.  Respiratory: Negative for cough and shortness of breath.   Cardiovascular: Negative for chest pain, palpitations and leg swelling.  Gastrointestinal: Negative for abdominal pain, blood in stool, constipation, diarrhea, nausea and vomiting.  Genitourinary: Negative for dysuria.  Musculoskeletal: Negative for falls and myalgias.  Skin: Negative for rash.  Neurological: Negative for dizziness.  Psychiatric/Behavioral: Negative for depression. The patient is not nervous/anxious.       Past Medical History:  Diagnosis Date  . Allergic rhinitis   . Allergy   . Arthritis   . Asthma   . Chronic headache   . Diabetes mellitus   . DVT (deep venous thrombosis) ([place])   . Dyspnea   . Heart murmur   . Hypertension   . Pulmonary embolism ([place])   . Pulmonary hypertension ([place])   . Seizures ([place])     reports that she has never smoked. She has never used smokeless tobacco. She reports current alcohol use. She reports that she does not use drugs.   Current Outpatient Medications:  .  acetaminophen-codeine (TYLENOL #3) 300-30 MG tablet, TAKE 1 TABLET BY MOUTH EVERY  12 HOURS AS NEEDED FOR UP TO 7 DAYS FOR MODERATE PAIN, Disp: 60 tablet, Rfl: 0 .  albuterol (VENTOLIN HFA) 108 (90 Base) MCG/ACT inhaler, INHALE 2 PUFFS BY MOUTH INTO THE LUNGS EVERY 6 HOURS AS NEEDED FOR WHEEZING OR SHORTNESS OF BREATH, Disp: 54 g, Rfl: 0 .  azelastine (ASTELIN) 0.1 % nasal spray, USE 1-2 SPRAYS IN EACH NOSTRIL TWICE DAILY AS NEEDED, Disp: 30 mL, Rfl: 5 .  clobetasol cream  (TEMOVATE) 2.13 %, Apply 1 application topically 2 (two) times daily., Disp: , Rfl:  .  cyclobenzaprine (FLEXERIL) 10 MG tablet, Take 1 tablet (10 mg total) by mouth at bedtime as needed for muscle spasms., Disp: 30 tablet, Rfl: 1 .  diltiazem (CARTIA XT) 120 MG 24 hr capsule, Take 1 capsule (120 mg total) by mouth daily. KEEP OV., Disp: 30 capsule, Rfl: 2 .  DULoxetine (CYMBALTA) 30 MG capsule, TAKE 1 CAPSULE(30 MG) BY MOUTH DAILY, Disp: 30 capsule, Rfl: 5 .  EPINEPHrine 0.3 mg/0.3 mL IJ SOAJ injection, USE AS DIRECTED FOR LIFE THREATENING ALLERGIC REACTIONS, Disp: 2 each, Rfl: 2 .  famotidine (PEPCID) 40 MG tablet, Take 1 tablet (40 mg total) by mouth daily., Disp: 90 tablet, Rfl: 1 .  fluticasone (FLOVENT HFA) 220 MCG/ACT inhaler, Inhale two puffs twice daily during flare-up as needed.  Rinse, gargle, and spit after use., Disp: 12 g, Rfl: 5 .  fluticasone-salmeterol (ADVAIR HFA) 230-21 MCG/ACT inhaler, Inhale two puffs twice daily to prevent cough or wheeze. Rinse mouth after use., Disp: 12 g, Rfl: 3 .  furosemide (LASIX) 40 MG tablet, TAKE 1 TABLET(40 MG) BY MOUTH DAILY, Disp: 30 tablet, Rfl: 3 .  gabapentin (NEURONTIN) 300 MG capsule, Take 1 capsule (300 mg total) by mouth at bedtime., Disp: 90 capsule, Rfl: 3 .  gabapentin (NEURONTIN) 600 MG tablet, One AM, one at lunch, one at bedtime (at bedtime take 600mg  and 300mg  tablet), Disp: 270 tablet, Rfl: 3 .  GLYXAMBI 10-5 MG TABS, TAKE 1 TABLET BY MOUTH DAILY, Disp: 30 tablet, Rfl: 2 .  hydrOXYzine (ATARAX/VISTARIL) 10 MG tablet, TK 1 TO 2 TS PO HS, Disp: , Rfl: 1 .  ipratropium (ATROVENT) 0.06 % nasal spray, Place 1-2 sprays each nostril 1-2 times per day, Disp: 15 mL, Rfl: 5 .  irbesartan (AVAPRO) 150 MG tablet, TAKE 1 TABLET(150 MG) BY MOUTH DAILY, Disp: 30 tablet, Rfl: 9 .  Lactase (DAIRY-RELIEF PO), Take 1 capsule by mouth daily as needed (stomach upset). , Disp: , Rfl:  .  levalbuterol (XOPENEX) 1.25 MG/3ML nebulizer solution, Take 1.25 mg by  nebulization every 6 (six) hours as needed for wheezing or shortness of breath., Disp: 1080 mL, Rfl: 1 .  Magnesium 250 MG TABS, Take 1 tablet by mouth daily., Disp: , Rfl:  .  methimazole (TAPAZOLE) 5 MG tablet, Take 2.5 mg daily by mouth, with meals, Disp: 30 tablet, Rfl: 5 .  montelukast (SINGULAIR) 10 MG tablet, TAKE 1 TABLET BY MOUTH EVERY DAY, Disp: 90 tablet, Rfl: 1 .  ONETOUCH DELICA LANCETS 08M MISC, as needed (blood check). , Disp: , Rfl:  .  ONETOUCH VERIO test strip, 1 each by Other route as needed (blood monitoring). , Disp: , Rfl:  .  pantoprazole (PROTONIX) 40 MG tablet, Take 1 tablet (40 mg total) by mouth daily., Disp: 90 tablet, Rfl: 1 .  PATADAY 0.2 % SOLN, INT 1 GTT IN OU QD PRN, Disp: , Rfl: 4 .  polyethylene glycol (MIRALAX / GLYCOLAX) packet, Take 17 g  by mouth daily., Disp: , Rfl:  .  Potassium 99 MG TABS, Take 99 mg by mouth daily., Disp: , Rfl:  .  Probiotic Product (ALIGN) 4 MG CAPS, Take 1 capsule by mouth daily., Disp: , Rfl:  .  SUMAtriptan (IMITREX) 100 MG tablet, Take 100 mg x 1 may, repeat in 2 hour for migraine., Disp: 10 tablet, Rfl: 0 .  Tiotropium Bromide Monohydrate (SPIRIVA RESPIMAT) 1.25 MCG/ACT AERS, Inhale 2 puffs into the lungs daily., Disp: 4 g, Rfl: 5 .  tolterodine (DETROL LA) 4 MG 24 hr capsule, Take 4 mg by mouth daily., Disp: , Rfl:  .  TRULANCE 3 MG TABS, Take 1 tablet by mouth daily. , Disp: , Rfl:  .  XARELTO 20 MG TABS tablet, TAKE 1 TABLET(20 MG) BY MOUTH DAILY WITH SUPPER, Disp: 90 tablet, Rfl: 1 .  fluconazole (DIFLUCAN) 150 MG tablet, Take 1 tab po today, repeat in 7 days if needed (Patient not taking: Reported on 02/19/2019), Disp: 2 tablet, Rfl: 0 .  oxybutynin (DITROPAN-XL) 10 MG 24 hr tablet, oxybutynin chloride ER 10 mg tablet,extended release 24 hr, Disp: , Rfl:   Current Facility-Administered Medications:  .  omalizumab (XOLAIR) injection 300 mg, 300 mg, Subcutaneous, Q28 days, Kozlow, Donnamarie Poag, MD, 300 mg at 01/27/19 1124    Observations/Objective: Height 5\' 5"  (1.651 m), weight 226 lb (102.5 kg).  Physical Exam  Physical Exam Constitutional:      General: The patient is not in acute distress. Pulmonary:     Effort: Pulmonary effort is normal. No respiratory distress.  Neurological:     Mental Status: The patient is alert and oriented to person, place, and time.  Psychiatric:        Mood and Affect: Mood normal.        Behavior: Behavior normal.   Assessment and Plan Dry mouth Likely due top oybutynin ER but continue as it has helped OAB a lot.  Diabetes mellitus with neuropathy (HCC) Good control on current regimen. No sign of dehydration.  Urge incontinence Improved with oxybutynin ER.. pt wishes to continue despite SE of dry skin and mouth.     I discussed the assessment and treatment plan with the patient. The patient was provided an opportunity to ask questions and all were answered. The patient agreed with the plan and demonstrated an understanding of the instructions.   The patient was advised to call back or seek an in-person evaluation if the symptoms worsen or if the condition fails to improve as anticipated.   Total visit time 12 minutes, > 50% spent counseling and cordinating patients care.   Alicia Lofts, MD

## 2019-02-20 MED ORDER — RIVAROXABAN 20 MG PO TABS: 20.0000 mg | ORAL_TABLET | Freq: Every day | ORAL | 1 refills | Status: DC

## 2019-02-20 NOTE — Telephone Encounter (Signed)
15F 102.5 kg, SCr 1.12 (03/2018) CrCl 73.5, LOV MC 05/2018

## 2019-02-23 DIAGNOSIS — M1711 Unilateral primary osteoarthritis, right knee: Secondary | ICD-10-CM | POA: Diagnosis not present

## 2019-02-23 DIAGNOSIS — M25552 Pain in left hip: Secondary | ICD-10-CM | POA: Diagnosis not present

## 2019-02-24 ENCOUNTER — Ambulatory Visit: Payer: Medicare Other | Admitting: Podiatry

## 2019-02-24 ENCOUNTER — Ambulatory Visit: Payer: Self-pay

## 2019-03-05 ENCOUNTER — Ambulatory Visit (INDEPENDENT_AMBULATORY_CARE_PROVIDER_SITE_OTHER): Payer: Medicare Other | Admitting: Adult Health

## 2019-03-05 ENCOUNTER — Other Ambulatory Visit: Payer: Self-pay

## 2019-03-05 ENCOUNTER — Encounter: Payer: Self-pay | Admitting: Adult Health

## 2019-03-05 VITALS — BP 126/78 | HR 79 | Temp 97.1°F | Ht 65.0 in | Wt 223.2 lb

## 2019-03-05 DIAGNOSIS — Z9989 Dependence on other enabling machines and devices: Secondary | ICD-10-CM | POA: Diagnosis not present

## 2019-03-05 DIAGNOSIS — G4733 Obstructive sleep apnea (adult) (pediatric): Secondary | ICD-10-CM | POA: Diagnosis not present

## 2019-03-05 DIAGNOSIS — J4550 Severe persistent asthma, uncomplicated: Secondary | ICD-10-CM | POA: Diagnosis not present

## 2019-03-05 NOTE — Patient Instructions (Signed)
Continue using CPAP nightly and greater than 4 hours each night °If your symptoms worsen or you develop new symptoms please let us know.  ° °

## 2019-03-05 NOTE — Progress Notes (Signed)
PATIENT: [name] DOB: [birth year]  REASON FOR VISIT: follow up HISTORY FROM: patient  HISTORY OF PRESENT ILLNESS: Today 03/05/19:  Ms. Vacca is a 73 year old female with a history of obstructive sleep apnea on CPAP.  Her download indicates that she used the machine 22 out of 30 days for compliance of 73%.  She used her machine greater than 4 hours 21 days for compliance of 70%.  On average she uses her machine 6 hours and 5 minutes.  Her residual AHI is 1.6 on 5 to 10 cm of water with EPR of 3.  Her leak in the 95th percentile is 29.7 L/min.   REVIEW OF SYSTEMS: Out of a complete 14 system review of symptoms, the patient complains only of the following symptoms, and all other reviewed systems are negative.  ALLERGIES: Allergies  Allergen Reactions  . Latex Hives  . Penicillins Swelling    Has patient had a PCN reaction causing immediate rash, facial/tongue/throat swelling, SOB or lightheadedness with hypotension patient had a PCN reaction causing severe rash involving mucus membranes or skin necrosis: [id number] Has patient had a PCN reaction that required hospitalization/No Has patient had a PCN reaction occurring within the last 10 years: No If all of the above answers are "NO", then may proceed with Cephalosporin use.     Recardo Evangelist [Pregabalin] Other (See Comments)    Blurred vision.  Marland Kitchen Phenytoin Sodium Extended Swelling    Swelling   . Vicodin [Hydrocodone-Acetaminophen] Nausea And Vomiting    HOME MEDICATIONS: Outpatient Medications Prior to Visit  Medication Sig Dispense Refill  . acetaminophen-codeine (TYLENOL #3) 300-30 MG tablet TAKE 1 TABLET BY MOUTH EVERY 12 HOURS AS NEEDED FOR UP TO 7 DAYS FOR MODERATE PAIN 60 tablet 0  . albuterol (VENTOLIN HFA) 108 (90 Base) MCG/ACT inhaler INHALE 2 PUFFS BY MOUTH INTO THE LUNGS EVERY 6 HOURS AS NEEDED FOR WHEEZING OR SHORTNESS OF BREATH 54 g 0  . azelastine (ASTELIN) 0.1 % nasal spray USE 1-2 SPRAYS IN EACH NOSTRIL  TWICE DAILY AS NEEDED 30 mL 5  . clobetasol cream (TEMOVATE) 4.97 % Apply 1 application topically 2 (two) times daily.    . cyclobenzaprine (FLEXERIL) 10 MG tablet Take 1 tablet (10 mg total) by mouth at bedtime as needed for muscle spasms. 30 tablet 1  . diltiazem (CARTIA XT) 120 MG 24 hr capsule Take 1 capsule (120 mg total) by mouth daily. KEEP OV. 30 capsule 2  . DULoxetine (CYMBALTA) 30 MG capsule TAKE 1 CAPSULE(30 MG) BY MOUTH DAILY 30 capsule 5  . EPINEPHrine 0.3 mg/0.3 mL IJ SOAJ injection USE AS DIRECTED FOR LIFE THREATENING ALLERGIC REACTIONS 2 each 2  . famotidine (PEPCID) 40 MG tablet Take 1 tablet (40 mg total) by mouth daily. 90 tablet 1  . fluconazole (DIFLUCAN) 150 MG tablet Take 1 tab po today, repeat in 7 days if needed (Patient not taking: Reported on 02/19/2019) 2 tablet 0  . fluticasone (FLOVENT HFA) 220 MCG/ACT inhaler Inhale two puffs twice daily during flare-up as needed.  Rinse, gargle, and spit after use. 12 g 5  . fluticasone-salmeterol (ADVAIR HFA) 230-21 MCG/ACT inhaler Inhale two puffs twice daily to prevent cough or wheeze. Rinse mouth after use. 12 g 3  . furosemide (LASIX) 40 MG tablet TAKE 1 TABLET(40 MG) BY MOUTH DAILY 30 tablet 3  . gabapentin (NEURONTIN) 300 MG capsule Take 1 capsule (300 mg total) by mouth at bedtime. 90 capsule 3  . gabapentin (NEURONTIN) 600 MG  tablet One AM, one at lunch, one at bedtime (at bedtime take 600mg  and 300mg  tablet) 270 tablet 3  . GLYXAMBI 10-5 MG TABS TAKE 1 TABLET BY MOUTH DAILY 30 tablet 2  . hydrOXYzine (ATARAX/VISTARIL) 10 MG tablet TK 1 TO 2 TS PO HS  1  . ipratropium (ATROVENT) 0.06 % nasal spray Place 1-2 sprays each nostril 1-2 times per day 15 mL 5  . irbesartan (AVAPRO) 150 MG tablet TAKE 1 TABLET(150 MG) BY MOUTH DAILY 30 tablet 9  . Lactase (DAIRY-RELIEF PO) Take 1 capsule by mouth daily as needed (stomach upset).     Marland Kitchen levalbuterol (XOPENEX) 1.25 MG/3ML nebulizer solution Take 1.25 mg by nebulization every 6 (six)  hours as needed for wheezing or shortness of breath. 1080 mL 1  . Magnesium 250 MG TABS Take 1 tablet by mouth daily.    . methimazole (TAPAZOLE) 5 MG tablet Take 2.5 mg daily by mouth, with meals 30 tablet 5  . montelukast (SINGULAIR) 10 MG tablet TAKE 1 TABLET BY MOUTH EVERY DAY 90 tablet 1  . ONETOUCH DELICA LANCETS 62B MISC as needed (blood check).     [name] VERIO test strip 1 each by Other route as needed (blood monitoring).     Marland Kitchen oxybutynin (DITROPAN-XL) 10 MG 24 hr tablet oxybutynin chloride ER 10 mg tablet,extended release 24 hr    . pantoprazole (PROTONIX) 40 MG tablet Take 1 tablet (40 mg total) by mouth daily. 90 tablet 1  . PATADAY 0.2 % SOLN INT 1 GTT IN OU QD PRN  4  . polyethylene glycol (MIRALAX / GLYCOLAX) packet Take 17 g by mouth daily.    . Potassium 99 MG TABS Take 99 mg by mouth daily.    . Probiotic Product (ALIGN) 4 MG CAPS Take 1 capsule by mouth daily.    . rivaroxaban (XARELTO) 20 MG TABS tablet Take 1 tablet (20 mg total) by mouth daily with supper. 90 tablet 1  . SUMAtriptan (IMITREX) 100 MG tablet Take 100 mg x 1 may, repeat in 2 hour for migraine. 10 tablet 0  . Tiotropium Bromide Monohydrate (SPIRIVA RESPIMAT) 1.25 MCG/ACT AERS Inhale 2 puffs into the lungs daily. 4 g 5  . tolterodine (DETROL LA) 4 MG 24 hr capsule Take 4 mg by mouth daily.    . TRULANCE 3 MG TABS Take 1 tablet by mouth daily.      Facility-Administered Medications Prior to Visit  Medication Dose Route Frequency Provider Last Rate Last Dose  . omalizumab Arvid Right) injection 300 mg  300 mg Subcutaneous Q28 days [name], MD   300 mg at 01/27/19 1124    PAST MEDICAL HISTORY: Past Medical History:  Diagnosis Date  . Allergic rhinitis   . Allergy   . Arthritis   . Asthma   . Chronic headache   . Diabetes mellitus   . DVT (deep venous thrombosis) ([place])   . Dyspnea   . Heart murmur   . Hypertension   . Pulmonary embolism ([place])   . Pulmonary hypertension ([place])   . Seizures  ([place])     PAST SURGICAL HISTORY: Past Surgical History:  Procedure Laterality Date  . ABDOMINAL HYSTERECTOMY     partial, has ovaries  . CARDIAC CATHETERIZATION  12/28/2010   Mod. pulmonary hypertension, normal coronary arteries  . DOPPLER ECHOCARDIOGRAPHY  10/08/2011   EF=>55%,mild asymmetric LVH, mod. TR, mod. PH, mild to mod LA dilatation  . KNEE ARTHROSCOPY Left   . KNEE SURGERY    .  Nuclear Stress Test  05/20/2006   No ischemia  . PARTIAL HYSTERECTOMY    . PLANTAR FASCIA SURGERY    . TONSILLECTOMY      FAMILY HISTORY: Family History  Problem Relation Age of Onset  . Hypertension Mother   . Clotting disorder Mother   . Breast cancer Mother   . Arthritis Mother   . Stroke Mother   . Diabetes Mother   . Cancer Brother   . Alcohol abuse Father   . Arthritis Sister   . Diabetes Sister   . Multiple sclerosis Sister   . Allergies Other        grandson    SOCIAL HISTORY: Social History   Socioeconomic History  . Marital status: Widowed    Spouse name: Not on file  . Number of children: Y  . Years of education: Not on file  . Highest education level: Not on file  Occupational History  . Occupation: retired Geologist, engineering.   Social Needs  . Financial resource strain: Not on file  . Food insecurity    Worry: Not on file    Inability: Not on file  . Transportation needs    Medical: Not on file    Non-medical: Not on file  Tobacco Use  . Smoking status: Never Smoker  . Smokeless tobacco: Never Used  Substance and Sexual Activity  . Alcohol use: Yes    Alcohol/week: 0.0 standard drinks    Comment: occ glass on wine  . Drug use: No  . Sexual activity: Never  Lifestyle  . Physical activity    Days per week: Not on file    Minutes per session: Not on file  . Stress: Not on file  Relationships  . Social Herbalist on phone: Not on file    Gets together: Not on file    Attends religious service: Not on file    Active member of club or  organization: Not on file    Attends meetings of clubs or organizations: Not on file    Relationship status: Not on file  . Intimate partner violence    Fear of current or ex partner: Not on file    Emotionally abused: Not on file    Physically abused: Not on file    Forced sexual activity: Not on file  Other Topics Concern  . Not on file  Social History Narrative   Widow    limited exercise.      PHYSICAL EXAM  Vitals:   03/05/19 0912  BP: 126/78  Pulse: 79  Temp: (!) 97.1 F (36.2 C)  TempSrc: Oral  Weight: 223 lb 3.2 oz (101.2 kg)  Height: 5\' 5"  (1.651 m)   Body mass index is 37.14 kg/m.  Generalized: Well developed, in no acute distress  Chest: lungs clear to ausculation bilaterally  Neurological examination  Mentation: Alert oriented to time, place, history taking. Follows all commands speech and language fluent Cranial nerve II-XII:Extraocular movements were full, visual field were full on confrontational test.  Head turning and shoulder shrug  were normal and symmetric. Motor: The motor testing reveals 5 over 5 strength of all 4 extremities. Good symmetric motor tone is noted throughout.  Sensory: Sensory testing is intact to soft touch on all 4 extremities. No evidence of extinction is noted.   Gait and station: Gait is normal. .    DIAGNOSTIC DATA (LABS, IMAGING, TESTING) - I reviewed patient records, labs, notes, testing and imaging myself where available.  Lab Results  Component Value Date   WBC 10.8 11/14/2018   HGB 13.5 11/14/2018   HCT 40.9 11/14/2018   MCV 74.4 (L) 11/14/2018   PLT 377 11/14/2018      Component Value Date/Time   NA 139 03/28/2018 1149   K 4.5 03/28/2018 1149   CL 103 03/28/2018 1149   CO2 25 03/28/2018 1149   GLUCOSE 127 (H) 03/28/2018 1149   BUN 13 03/28/2018 1149   CREATININE 1.12 03/28/2018 1149   CALCIUM 9.3 03/28/2018 1149   PROT 7.1 03/28/2018 1149   ALBUMIN 3.9 03/28/2018 1149   AST 13 03/28/2018 1149   ALT 12  03/28/2018 1149   ALKPHOS 82 03/28/2018 1149   BILITOT 0.7 03/28/2018 1149   GFRNONAA 45 (L) 11/21/2016 1933   GFRAA 52 (L) 11/21/2016 1933   Lab Results  Component Value Date   CHOL 154 03/28/2018   HDL 52.90 03/28/2018   LDLCALC 82 03/28/2018   TRIG 94.0 03/28/2018   CHOLHDL 3 03/28/2018   Lab Results  Component Value Date   HGBA1C 6.3 03/28/2018   Lab Results  Component Value Date   VITAMINB12 878 08/29/2018   Lab Results  Component Value Date   TSH 1.84 11/27/2018      ASSESSMENT AND PLAN 73 y.o. year old female  has a past medical history of Allergic rhinitis, Allergy, Arthritis, Asthma, Chronic headache, Diabetes mellitus, DVT (deep venous thrombosis) ([place]), Dyspnea, Heart murmur, Hypertension, Pulmonary embolism ([place]), Pulmonary hypertension ([place]), and Seizures ([place]). here with:  1.  Obstructive sleep apnea on CPAP  Patient CPAP download shows excellent compliance and good treatment of her apnea.  She is encouraged to continue using CPAP nightly and greater than 4 hours each night.  She is advised that if her symptoms worsen or she develops new symptoms she should let us know.  She will follow-up in 1 year or sooner if needed    I spent 15 minutes with the patient. 50% of this time was spent reviewing CPAP download   Ward Givens, MSN, NP-C 03/05/2019, 9:12 AM [facility] [address], [place], [place] [address] [phone]

## 2019-03-06 ENCOUNTER — Ambulatory Visit (INDEPENDENT_AMBULATORY_CARE_PROVIDER_SITE_OTHER): Payer: Medicare Other

## 2019-03-06 ENCOUNTER — Other Ambulatory Visit: Payer: Self-pay

## 2019-03-06 DIAGNOSIS — J4550 Severe persistent asthma, uncomplicated: Secondary | ICD-10-CM | POA: Diagnosis not present

## 2019-03-09 ENCOUNTER — Telehealth: Payer: Self-pay | Admitting: Family Medicine

## 2019-03-09 NOTE — Telephone Encounter (Signed)
C/B number [phone] voicemail was not set up so I was unable to leave message. Left message on home number for Ms. Mccort to call back and speak with triage nurse to give more details about the itching she is having.

## 2019-03-09 NOTE — Telephone Encounter (Signed)
Pt left v/m; pt has generalized itching all over her body. Pt wants to know if there is a cream that could be prescribed. [facility] [address]. I spoke with pt and pt has itching for 2 days; there is no rash and pt has not changed laundry detergent or soap etc. There are no signs of bites on skin.  had itching like this about 2 yrs ago; thought was allerges; Dr Diona Browner gave a cream couple of yrs ago but pt does not know the name of the cream and the pharmacy had no record. 2 yrs ago pt had changed laundry detergents but this time pt has not changed anything. Pt wants to know if Dr Diona Browner can prescribe a cream or should pt call her dermatologist. Pt request cb on 03/10/19.

## 2019-03-09 NOTE — Telephone Encounter (Signed)
Patient is requesting a call back. She would like to discuss getting a prescription for itching.   Patient did not give any further details,  wanted to speak to Dr [facility] medical assistant  C/B - [phone]

## 2019-03-10 DIAGNOSIS — N3941 Urge incontinence: Secondary | ICD-10-CM | POA: Diagnosis not present

## 2019-03-10 DIAGNOSIS — R350 Frequency of micturition: Secondary | ICD-10-CM | POA: Diagnosis not present

## 2019-03-10 MED ORDER — TRIAMCINOLONE ACETONIDE 0.1 % EX CREA: 1.0000 "application " | TOPICAL_CREAM | Freq: Two times a day (BID) | CUTANEOUS | 0 refills | Status: DC

## 2019-03-10 NOTE — Telephone Encounter (Signed)
[name] notified as instructed by telephone.  Patient states understanding. 

## 2019-03-10 NOTE — Telephone Encounter (Signed)
I will prescribe a cream to use focally on ithcy areas.. do not spread all over. Use twice daily for 2 weeks... if not improving.. she needs appt withe derm.

## 2019-03-16 ENCOUNTER — Other Ambulatory Visit: Payer: Self-pay | Admitting: Internal Medicine

## 2019-03-19 ENCOUNTER — Encounter: Payer: Medicare Other | Admitting: Family Medicine

## 2019-03-19 DIAGNOSIS — M1711 Unilateral primary osteoarthritis, right knee: Secondary | ICD-10-CM | POA: Diagnosis not present

## 2019-03-19 DIAGNOSIS — M5136 Other intervertebral disc degeneration, lumbar region: Secondary | ICD-10-CM | POA: Diagnosis not present

## 2019-03-26 ENCOUNTER — Ambulatory Visit: Payer: Medicare Other | Admitting: Podiatry

## 2019-03-30 ENCOUNTER — Ambulatory Visit: Payer: Medicare Other

## 2019-04-02 ENCOUNTER — Encounter: Payer: Self-pay | Admitting: Podiatry

## 2019-04-02 ENCOUNTER — Encounter: Payer: Medicare Other | Admitting: Family Medicine

## 2019-04-02 ENCOUNTER — Ambulatory Visit (INDEPENDENT_AMBULATORY_CARE_PROVIDER_SITE_OTHER): Payer: Medicare Other | Admitting: Podiatry

## 2019-04-02 ENCOUNTER — Other Ambulatory Visit: Payer: Self-pay

## 2019-04-02 DIAGNOSIS — Q828 Other specified congenital malformations of skin: Secondary | ICD-10-CM | POA: Diagnosis not present

## 2019-04-02 DIAGNOSIS — E1142 Type 2 diabetes mellitus with diabetic polyneuropathy: Secondary | ICD-10-CM | POA: Diagnosis not present

## 2019-04-02 DIAGNOSIS — M79676 Pain in unspecified toe(s): Secondary | ICD-10-CM

## 2019-04-02 DIAGNOSIS — B351 Tinea unguium: Secondary | ICD-10-CM | POA: Diagnosis not present

## 2019-04-02 MED ORDER — GABAPENTIN 600 MG PO TABS: ORAL_TABLET | ORAL | 1 refills | Status: DC

## 2019-04-02 NOTE — Progress Notes (Signed)
She presents today for follow-up of her diabetic peripheral neuropathy states that I am taking the medication like he said but is still not helping she is also complaining of painfully elongated toenails and her calluses.  Objective: Vital signs are stable alert and oriented x3.  Pulses are palpable.  Neurologic sensorium is diminished per Semmes Weinstein monofilament consistent with neuropathy.  Nails are long thick yellow dystrophic with mycotic reactive hyperkeratotic lesions present.  Assessment: Pain in limb secondary to bite diabetic peripheral neuropathy and pain in limb second onychomycosis and porokeratosis.  Plan: I increased her gabapentin today 600 mg at breakfast 600 mg at lunch 1200 mg at bedtime.  Debrided all reactive hyperkeratotic tissue debrided toenails 1 through 5 bilaterally.  Follow-up with her in about 3 months

## 2019-04-03 ENCOUNTER — Encounter: Payer: Medicare Other | Admitting: Family Medicine

## 2019-04-06 ENCOUNTER — Other Ambulatory Visit: Payer: Self-pay

## 2019-04-06 ENCOUNTER — Ambulatory Visit (INDEPENDENT_AMBULATORY_CARE_PROVIDER_SITE_OTHER): Payer: Medicare Other | Admitting: Internal Medicine

## 2019-04-06 ENCOUNTER — Encounter: Payer: Self-pay | Admitting: Internal Medicine

## 2019-04-06 VITALS — BP 140/92 | Ht 65.0 in

## 2019-04-06 DIAGNOSIS — E041 Nontoxic single thyroid nodule: Secondary | ICD-10-CM | POA: Diagnosis not present

## 2019-04-06 DIAGNOSIS — E0500 Thyrotoxicosis with diffuse goiter without thyrotoxic crisis or storm: Secondary | ICD-10-CM

## 2019-04-06 DIAGNOSIS — E1140 Type 2 diabetes mellitus with diabetic neuropathy, unspecified: Secondary | ICD-10-CM | POA: Diagnosis not present

## 2019-04-06 LAB — T4, FREE: Free T4: 0.98 ng/dL (ref 0.60–1.60)

## 2019-04-06 LAB — T3, FREE: T3, Free: 2.7 pg/mL (ref 2.3–4.2)

## 2019-04-06 LAB — TSH: TSH: 1.39 u[IU]/mL (ref 0.35–4.50)

## 2019-04-06 NOTE — Progress Notes (Signed)
Patient ID: [name], female   DOB: 12/29/1945, 73 y.o.   MRN: [id number]    HPI  [name] is a 73 y.o.-year-old female, returning for f/u for for thyrotoxicosis and thyroid nodule.  She previously saw another endocrinologist (Dr. Wilson Singer), but only for her diabetes. Last OV with me 6 months ago.  Reviewed and addended history Patient was found to be thyrotoxic in 08/2015 but she reports that she was only informed about this in 2018.  Thyroid scan indicated possible Graves' disease and also called left thyroid nodule.  This was biopsied with benign results in 11/2017.  After her Graves' diagnosis, she was started on methimazole and dose was adjusted with subsequent measurements.. she continues on diltiazem CD.  TSI's were also elevated then.  She started to feel better on methimazole, less fatigued.  At last visit we were able to decrease the dose to 2.5 mg daily.  No side effects from [place].  Reviewed her TFTs Lab Results  Component Value Date   TSH 1.84 11/27/2018   TSH 4.30 10/06/2018   TSH 4.03 08/05/2018   TSH 6.04 (H) 06/05/2018   TSH <0.01 (L) 02/06/2018   TSH <0.01 (L) 09/27/2017   TSH 0.01 (L) 02/05/2017   TSH 0.07 (L) 03/22/2016   TSH 0.06 (L) 12/09/2015   TSH 0.03 Repeated and verified X2. (L) 08/23/2015   FREET4 0.93 11/27/2018   FREET4 0.75 10/06/2018   FREET4 0.88 08/05/2018   FREET4 0.59 (L) 06/05/2018   FREET4 1.89 (H) 02/06/2018   FREET4 1.94 (H) 09/27/2017   FREET4 1.19 02/05/2017   FREET4 1.91 (H) 03/22/2016   FREET4 1.45 12/09/2015   FREET4 1.84 Repeated and verified X2. (H) 08/23/2015   T3FREE 3.0 11/27/2018   T3FREE 3.1 10/06/2018   T3FREE 2.9 08/05/2018   T3FREE 2.6 06/05/2018   T3FREE 3.9 02/06/2018   T3FREE 3.4 09/27/2017   T3FREE 3.5 02/05/2017   T3FREE 3.8 03/22/2016   T3FREE 3.9 12/09/2015   Thyroid uptake and scan (10/31/2017): Increased uptake of 40%, and cold left inferior pole nodule.  Thyroid U/S (11/12/2017): 3.2 cm left  inferior TR 3 nodule correlates with the cold nodule by nuclear medicine scan. This nodule meets criteria for biopsy as above.  FNA of her left thyroid nodule (12/04/2017): Benign  Her Graves' antibodies were elevated: Lab Results  Component Value Date   TSI 361 (H) 02/06/2018   Pt denies: - feeling nodules in neck - hoarseness - dysphagia - SOB with lying down She has some choking >> improved.  Pt does not have a FH of thyroid ds. No FH of thyroid cancer. No h/o radiation tx to head or neck.  No seaweed or kelp. No recent contrast studies. No herbal supplements. No Biotin use. No recent steroids use.   Pt. also has a history of DM2, asthma PE, urinary urgency.  ROS: Constitutional: no weight gain/no weight loss, no fatigue, no subjective hyperthermia, no subjective hypothermia Eyes: no blurry vision, no xerophthalmia ENT: no sore throat,+ see HPI Cardiovascular: no CP/no SOB/no palpitations/+ leg swelling (R) Respiratory: no cough/no SOB/no wheezing Gastrointestinal: no N/no V/no D/no C/no acid reflux Musculoskeletal: no muscle aches/no joint aches Skin: no rashes, no hair loss Neurological: no tremors/no numbness/no tingling/+ dizziness  I reviewed pt's medications, allergies, PMH, social hx, family hx, and changes were documented in the history of present illness. Otherwise, unchanged from my initial visit note.  Past Medical History:  Diagnosis Date  . Allergic rhinitis   .  Allergy   . Arthritis   . Asthma   . Chronic headache   . Diabetes mellitus   . DVT (deep venous thrombosis) ([place])   . Dyspnea   . Heart murmur   . Hypertension   . Pulmonary embolism ([place])   . Pulmonary hypertension ([place])   . Seizures ([place])    Past Surgical History:  Procedure Laterality Date  . ABDOMINAL HYSTERECTOMY     partial, has ovaries  . CARDIAC CATHETERIZATION  12/28/2010   Mod. pulmonary hypertension, normal coronary arteries  . DOPPLER ECHOCARDIOGRAPHY  10/08/2011    EF=>55%,mild asymmetric LVH, mod. TR, mod. PH, mild to mod LA dilatation  . KNEE ARTHROSCOPY Left   . KNEE SURGERY    . Nuclear Stress Test  05/20/2006   No ischemia  . PARTIAL HYSTERECTOMY    . PLANTAR FASCIA SURGERY    . TONSILLECTOMY     Social History   Socioeconomic History  . Marital status: Widowed    Spouse name: Not on file  . Number of children: Y  . Years of education: Not on file  . Highest education level: Not on file  Occupational History  . Occupation: retired Geologist, engineering.   Social Needs  . Financial resource strain: Not on file  . Food insecurity    Worry: Not on file    Inability: Not on file  . Transportation needs    Medical: Not on file    Non-medical: Not on file  Tobacco Use  . Smoking status: Never Smoker  . Smokeless tobacco: Never Used  Substance and Sexual Activity  . Alcohol use: Yes    Alcohol/week: 0.0 standard drinks    Comment: occ glass on wine  . Drug use: No  . Sexual activity: Never  Lifestyle  . Physical activity    Days per week: Not on file    Minutes per session: Not on file  . Stress: Not on file  Relationships  . Social Herbalist on phone: Not on file    Gets together: Not on file    Attends religious service: Not on file    Active member of club or organization: Not on file    Attends meetings of clubs or organizations: Not on file    Relationship status: Not on file  . Intimate partner violence    Fear of current or ex partner: Not on file    Emotionally abused: Not on file    Physically abused: Not on file    Forced sexual activity: Not on file  Other Topics Concern  . Not on file  Social History Narrative   Widow    limited exercise.   Current Outpatient Medications on File Prior to Visit  Medication Sig Dispense Refill  . acetaminophen-codeine (TYLENOL #3) 300-30 MG tablet TAKE 1 TABLET BY MOUTH EVERY 12 HOURS AS NEEDED FOR UP TO 7 DAYS FOR MODERATE PAIN 60 tablet 0  . albuterol (VENTOLIN HFA)  108 (90 Base) MCG/ACT inhaler INHALE 2 PUFFS BY MOUTH INTO THE LUNGS EVERY 6 HOURS AS NEEDED FOR WHEEZING OR SHORTNESS OF BREATH 54 g 0  . azelastine (ASTELIN) 0.1 % nasal spray USE 1-2 SPRAYS IN EACH NOSTRIL TWICE DAILY AS NEEDED 30 mL 5  . clobetasol cream (TEMOVATE) [id number] % Apply 1 application topically 2 (two) times daily.    . cyclobenzaprine (FLEXERIL) 10 MG tablet Take 1 tablet (10 mg total) by mouth at bedtime as needed for muscle spasms. 30 tablet  1  . diltiazem (CARTIA XT) 120 MG 24 hr capsule Take 1 capsule (120 mg total) by mouth daily. KEEP OV. 30 capsule 2  . DULoxetine (CYMBALTA) 30 MG capsule TAKE 1 CAPSULE(30 MG) BY MOUTH DAILY 30 capsule 5  . EPINEPHrine 0.3 mg/0.3 mL IJ SOAJ injection USE AS DIRECTED FOR LIFE THREATENING ALLERGIC REACTIONS 2 each 2  . famotidine (PEPCID) 20 MG tablet Take 20 mg by mouth daily.    . fluconazole (DIFLUCAN) 150 MG tablet Take 1 tab po today, repeat in 7 days if needed (Patient not taking: Reported on 02/19/2019) 2 tablet 0  . fluticasone (FLOVENT HFA) 220 MCG/ACT inhaler Inhale two puffs twice daily during flare-up as needed.  Rinse, gargle, and spit after use. 12 g 5  . fluticasone-salmeterol (ADVAIR HFA) 230-21 MCG/ACT inhaler Inhale two puffs twice daily to prevent cough or wheeze. Rinse mouth after use. 12 g 3  . furosemide (LASIX) 40 MG tablet TAKE 1 TABLET(40 MG) BY MOUTH DAILY (Patient not taking: Reported on 03/05/2019) 30 tablet 3  . gabapentin (NEURONTIN) 600 MG tablet Take one at breakfast, one at lunch, 2 at bedtime 360 tablet 1  . GLYXAMBI 10-5 MG TABS TAKE 1 TABLET BY MOUTH DAILY 30 tablet 2  . hydrOXYzine (ATARAX/VISTARIL) 10 MG tablet TK 1 TO 2 TS PO HS  1  . ipratropium (ATROVENT) 0.06 % nasal spray Place 1-2 sprays each nostril 1-2 times per day 15 mL 5  . irbesartan (AVAPRO) 150 MG tablet TAKE 1 TABLET(150 MG) BY MOUTH DAILY 30 tablet 9  . Lactase (DAIRY-RELIEF PO) Take 1 capsule by mouth daily as needed (stomach upset).     Marland Kitchen  levalbuterol (XOPENEX) 1.25 MG/3ML nebulizer solution Take 1.25 mg by nebulization every 6 (six) hours as needed for wheezing or shortness of breath. 1080 mL 1  . Magnesium 250 MG TABS Take 1 tablet by mouth daily.    . methimazole (TAPAZOLE) 5 MG tablet Take 2.5 mg daily by mouth, with meals 30 tablet 5  . montelukast (SINGULAIR) 10 MG tablet TAKE 1 TABLET BY MOUTH EVERY DAY 90 tablet 1  . ONETOUCH DELICA LANCETS 99991111 MISC as needed (blood check).     [name] VERIO test strip 1 each by Other route as needed (blood monitoring).     Marland Kitchen oxybutynin (DITROPAN-XL) 10 MG 24 hr tablet oxybutynin chloride ER 10 mg tablet,extended release 24 hr    . pantoprazole (PROTONIX) 40 MG tablet Take 1 tablet (40 mg total) by mouth daily. 90 tablet 1  . PATADAY 0.2 % SOLN INT 1 GTT IN OU QD PRN  4  . polyethylene glycol (MIRALAX / GLYCOLAX) packet Take 17 g by mouth daily.    . Potassium 99 MG TABS Take 99 mg by mouth daily.    . predniSONE (DELTASONE) 5 MG tablet prednisone 5 mg tablet  1 tab tid x 2 days, 1 tab bid x 5 days, 1 tab qd till finished    . Probiotic Product (ALIGN) 4 MG CAPS Take 1 capsule by mouth daily.    . rivaroxaban (XARELTO) 20 MG TABS tablet Take 1 tablet (20 mg total) by mouth daily with supper. 90 tablet 1  . SUMAtriptan (IMITREX) 100 MG tablet Take 100 mg x 1 may, repeat in 2 hour for migraine. 10 tablet 0  . Tiotropium Bromide Monohydrate (SPIRIVA RESPIMAT) 1.25 MCG/ACT AERS Inhale 2 puffs into the lungs daily. 4 g 5  . tolterodine (DETROL LA) 4 MG 24 hr capsule  Take 4 mg by mouth daily.    Marland Kitchen triamcinolone cream (KENALOG) 0.1 % Apply 1 application topically 2 (two) times daily. 30 g 0  . TRULANCE 3 MG TABS Take 1 tablet by mouth daily.      Current Facility-Administered Medications on File Prior to Visit  Medication Dose Route Frequency Provider Last Rate Last Dose  . omalizumab Arvid Right) injection 300 mg  300 mg Subcutaneous Q28 days [name], MD   300 mg at 03/06/19 1330    Allergies  Allergen Reactions  . Latex Hives  . Penicillins Swelling    Has patient had a PCN reaction causing immediate rash, facial/tongue/throat swelling, SOB or lightheadedness with hypotension patient had a PCN reaction causing severe rash involving mucus membranes or skin necrosis: [id number] Has patient had a PCN reaction that required hospitalization/No Has patient had a PCN reaction occurring within the last 10 years: No If all of the above answers are "NO", then may proceed with Cephalosporin use.     Recardo Evangelist [Pregabalin] Other (See Comments)    Blurred vision.  Marland Kitchen Phenytoin Sodium Extended Swelling    Swelling   . Vicodin [Hydrocodone-Acetaminophen] Nausea And Vomiting   Family History  Problem Relation Age of Onset  . Hypertension Mother   . Clotting disorder Mother   . Breast cancer Mother   . Arthritis Mother   . Stroke Mother   . Diabetes Mother   . Cancer Brother   . Alcohol abuse Father   . Arthritis Sister   . Diabetes Sister   . Multiple sclerosis Sister   . Allergies Other        grandson    PE: BP (!) 140/92   Ht 5\' 5"  (1.651 m)   BMI 37.14 kg/m  Wt Readings from Last 3 Encounters:  03/05/19 223 lb 3.2 oz (101.2 kg)  02/19/19 226 lb (102.5 kg)  01/05/19 225 lb 4 oz (102.2 kg)   Constitutional: overweight, in NAD Eyes: PERRLA, EOMI, no exophthalmos ENT: moist mucous membranes, no thyromegaly, no cervical lymphadenopathy Cardiovascular: RRR, No MRG Respiratory: CTA B Gastrointestinal: abdomen soft, NT, ND, BS+ Musculoskeletal: no deformities, strength intact in all 4 Skin: moist, warm, no rashes Neurological: no tremor with outstretched hands, DTR normal in all 4  ASSESSMENT: 1.  Graves' disease  2.  Left thyroid nodule  3. DM2  PLAN:  1. Patient with history of thyrotoxicosis consistent with Graves' disease.  She had thyrotoxic symptoms: Fatigue, heat intolerance, insomnia, but no hyper defecation, palpitations, anxiety.  A  thyroid uptake and scan suggested Graves' disease and TSI antibodies confirm the diagnosis.  She was started on methimazole, which was increased to 10 mg and 5 mg in p.m., however, afterwards, we were able to gradually reduce the dose to 2.5 mg daily at last visit.  She also continues Cardizem CD, which is chronic treatment for her. -She is starting to feel better after starting methimazole and still feels well after decreasing the dose -We discussed about possible modalities of treatment for Graves' disease, to include methimazole use, RAI treatment, or, last result, surgery.  As she is responding well to methimazole, will continue this for she is aware of possible side effects from methimazole -Latest TFTs were normal in 09/2018 after which we decreased the methimazole dose to 2.5 mg daily.  Subsequent TSH was normal: Lab Results  Component Value Date   TSH 1.84 11/27/2018  -We will check a TSH, free T4, free T3 and TSIs today -I will  see her back in 6 months  2.  Left thyroid nodule -This appeared to be cold on the uptake and scan -An FNA of the nodule in 11/2017 was benign -She denies neck compression symptoms except choking >> chronic, improved -We will continue to follow the nodule expectantly for now  3. DM2 -Managed by PCP -Appears well-controlled, but I do not have recent HbA1c levels: Lab Results  Component Value Date   HGBA1C 6.3 03/28/2018   HGBA1C 5.9 03/25/2017   HGBA1C 6.2 08/14/2016  - she has an appt with Dr. Diona Browner in 4 days >> would like to wait until then to have this rechecked and to get the flu shot  Office Visit on 04/06/2019  Component Date Value Ref Range Status  . TSI 04/06/2019 395* <140 % baseline Final   Comment: . Thyroid stimulating immunoglobulins (TSI) can engage the TSH receptors resulting in hyperthyroidism in Graves' disease patients. TSI levels can be useful in monitoring the clinical outcome of Graves' disease as well as assessing the potential  for hyperthyroidism from maternal-fetal transfer. TSI results greater than or equal to (>=) 140% of the Reference Control are considered positive. Marland Kitchen NOTE: A serum TSH level greater than 350 micro-International Units/mL can interfere with the TSI bioassay and potentially give false positive results. . Patients who are pregnant and are suspected of having hyperthyroidism should have both TSI and human Chorionic Gonadotropin(hCG) tests measured. A serum hCG level greater than 40,625 mIU/mL can interfere with the TSI bioassay and may give false negative results. In these patients it is recommended that a second TSI be obtained when the hCG concentration falls below 40,625 mIU/mL (usually after approximately 20-weeks gestation)                          . . The analytical performance characteristics of this assay have been determined by [facility], [place], [place].  The modifications have not been cleared or approved by the FDA.  This assay has been validated pursuant to the CLIA regulations and is used for clinical purposes. .   . T3, Free 04/06/2019 2.7  2.3 - 4.2 pg/mL Final  . Free T4 04/06/2019 0.98  0.60 - 1.60 ng/dL Final   Comment: Specimens from patients who are undergoing biotin therapy and /or ingesting biotin supplements may contain high levels of biotin.  The higher biotin concentration in these specimens interferes with this Free T4 assay.  Specimens that contain high levels  of biotin may cause false high results for this Free T4 assay.  Please interpret results in light of the total clinical presentation of the patient.    Marland Kitchen TSH 04/06/2019 1.39  0.35 - 4.50 uIU/mL Final   TFTs are normal but her TSI antibodies are still high.  In fact, they are higher than before.  Therefore, we will continue with the current dose of methimazole.  Philemon Kingdom, MD PhD [facility] Endocrinology

## 2019-04-06 NOTE — Patient Instructions (Signed)
Please continue: - Methimazole 5 mg every other day with a meal  Please stop at the lab.  Please come back for a follow-up appointment in 6 months.

## 2019-04-07 ENCOUNTER — Ambulatory Visit: Payer: Self-pay

## 2019-04-08 ENCOUNTER — Other Ambulatory Visit: Payer: Self-pay

## 2019-04-08 ENCOUNTER — Telehealth: Payer: Self-pay

## 2019-04-08 ENCOUNTER — Ambulatory Visit (HOSPITAL_COMMUNITY)
Admission: EM | Admit: 2019-04-08 | Discharge: 2019-04-08 | Disposition: A | Discharge status: Home / Self Care | Payer: Medicare Other | Attending: Family Medicine | Admitting: Family Medicine

## 2019-04-08 ENCOUNTER — Telehealth: Payer: Self-pay | Admitting: Family Medicine

## 2019-04-08 ENCOUNTER — Encounter (HOSPITAL_COMMUNITY): Payer: Self-pay | Admitting: Emergency Medicine

## 2019-04-08 DIAGNOSIS — R42 Dizziness and giddiness: Secondary | ICD-10-CM

## 2019-04-08 DIAGNOSIS — E1140 Type 2 diabetes mellitus with diabetic neuropathy, unspecified: Secondary | ICD-10-CM

## 2019-04-08 LAB — THYROID STIMULATING IMMUNOGLOBULIN: TSI: 395 % baseline — ABNORMAL HIGH (ref <140)

## 2019-04-08 MED ORDER — MECLIZINE HCL 25 MG PO TABS: 25.0000 mg | ORAL_TABLET | Freq: Three times a day (TID) | ORAL | Status: DC | PRN

## 2019-04-08 MED ORDER — ONDANSETRON HCL 4 MG/2ML IJ SOLN
4.0000 mg | Freq: Once | INTRAMUSCULAR | Status: AC
  Administered 2019-04-08: 4 mg via INTRAMUSCULAR

## 2019-04-08 MED ORDER — MECLIZINE HCL 12.5 MG PO TABS: 12.5000 mg | ORAL_TABLET | Freq: Three times a day (TID) | ORAL | 0 refills | Status: DC | PRN

## 2019-04-08 MED ORDER — ONDANSETRON HCL 4 MG/2ML IJ SOLN
INTRAMUSCULAR | Status: AC
  Filled 2019-04-08: qty 2

## 2019-04-08 NOTE — Telephone Encounter (Signed)
-----   Message from Cloyd Stagers, RT sent at 03/31/2019  1:39 PM EDT ----- Regarding: Lab Orders for Thursday 9.24.2020 Please place lab orders for Thursday 9.24.2020, office visit for physical on Friday 9.25.2020 Thank you, Dyke Maes RT(R)

## 2019-04-08 NOTE — ED Triage Notes (Addendum)
Patient reports onset 3-4 hours ago.  Patient has headache, dizziness and nausea that started today.    History of migraines years ago and this has felt similar.  Denies chest pain.  Ears itching

## 2019-04-08 NOTE — Telephone Encounter (Signed)
Pt said 2 hrs ago she started with H/A (pain level 3,dizziness, lightheaded,eyes feel funny or tired; no vision changes. Pt said right now she is very lightheaded. Pt has had some wheezing today and dry cough. No CP,SOB,weakness in legs or arms. Pt does not have way to ck BP; pt will get someone to take her to UC; pt is not sure if she will go to [place] or [place] [facility] but she will go now.FYI to Dr Diona Browner.

## 2019-04-08 NOTE — ED Provider Notes (Signed)
[name]    CSN: [id number] Arrival date & time: 04/08/19  1736      History   Chief Complaint Chief Complaint  Patient presents with   Dizziness   Migraine    HPI [name] is a 72 y.o. female.   Onset 2 to 3 hours prior to arrival of dizziness which she describes as more lightheadedness rather than vertigo.  After the dizziness started she developed headache which was reminiscent of her migraine that she had some years ago.  He has not had migraine in recent years.  Blood pressure is treated and well controlled.  There are no other neurologic symptoms.  HPI  Past Medical History:  Diagnosis Date   Allergic rhinitis    Allergy    Arthritis    Asthma    Chronic headache    Diabetes mellitus    DVT (deep venous thrombosis) (HCC)    Dyspnea    Heart murmur    Hypertension    Pulmonary embolism ([place])    Pulmonary hypertension ([place])    Seizures ([place])     Patient Active Problem List   Diagnosis Date Noted   Chronic pain 12/11/2018   Left foot pain 11/14/2018   Obstructive sleep apnea treated with continuous positive airway pressure (CPAP) 06/03/2018   Encounter for chronic pain management 04/01/2018   Left thyroid nodule 02/06/2018   Insomnia secondary to chronic pain 01/26/2018   Circadian rhythm sleep disorder, shift work type 12/03/2017   Class 3 drug-induced obesity with serious comorbidity and body mass index (BMI) of 40.0 to 44.9 in adult ([place]) 12/03/2017   Graves disease 10/15/2017   Mild obstructive sleep apnea 10/15/2017   Hot flashes not due to menopause 09/27/2017   Reactive monocytosis 01/11/2017   Chronic low back pain without sciatica 12/03/2016   Generalized abdominal pain 11/26/2016   Dry mouth 11/01/2016   Bilateral high frequency sensorineural hearing loss 10/24/2016   Subjective tinnitus, bilateral 10/24/2016   Candidal intertrigo 07/24/2016   Groin pain 04/27/2016   Urge incontinence  03/27/2016   Fatigue 12/09/2015   Moderate persistent asthma 07/12/2015   Paroxysmal atrial fibrillation with rapid ventricular response ([place]) 06/03/2015   Counseling regarding end of life decision making 05/05/2015   Neuropathy due to secondary diabetes ([place]) 01/11/2015   Pain in Achilles tendon 01/11/2015   Migraine headache without aura 08/24/2014   Allergic rhinitis 03/25/2014   DVT (deep venous thrombosis) hx of  03/25/2014   Osteoarthritis of right knee 03/25/2014    ? of Seizure disorder 03/25/2014   Depression, major, in remission ([place]) 03/25/2014   Diabetes mellitus with neuropathy ([place]) 03/25/2014   Essential hypertension, benign 03/25/2014   GERD (gastroesophageal reflux disease) 03/25/2014   History of pulmonary embolism 10/09/2013   Hypercoagulable state ([place]) 01/29/2011   Asthma, moderate persistent 01/24/2011   Pulmonary hypertension ([place]) 01/11/2011    Past Surgical History:  Procedure Laterality Date   ABDOMINAL HYSTERECTOMY     partial, has ovaries   CARDIAC CATHETERIZATION  12/28/2010   Mod. pulmonary hypertension, normal coronary arteries   DOPPLER ECHOCARDIOGRAPHY  10/08/2011   EF=>55%,mild asymmetric LVH, mod. TR, mod. PH, mild to mod LA dilatation   KNEE ARTHROSCOPY Left    KNEE SURGERY     Nuclear Stress Test  05/20/2006   No ischemia   PARTIAL HYSTERECTOMY     PLANTAR FASCIA SURGERY     TONSILLECTOMY      OB History   No obstetric history on  file.      Home Medications    Prior to Admission medications   Medication Sig Start Date End Date Taking? Authorizing Provider  albuterol (VENTOLIN HFA) 108 (90 Base) MCG/ACT inhaler INHALE 2 PUFFS BY MOUTH INTO THE LUNGS EVERY 6 HOURS AS NEEDED FOR WHEEZING OR SHORTNESS OF BREATH 02/05/19  Yes Kozlow, Donnamarie Poag, MD  azelastine (ASTELIN) 0.1 % nasal spray USE 1-2 SPRAYS IN EACH NOSTRIL TWICE DAILY AS NEEDED 01/27/19  Yes Kozlow, Donnamarie Poag, MD  diltiazem (CARTIA XT) 120 MG 24 hr capsule  Take 1 capsule (120 mg total) by mouth daily. KEEP OV. 04/16/16  Yes Croitoru, Mihai, MD  DULoxetine (CYMBALTA) 30 MG capsule TAKE 1 CAPSULE(30 MG) BY MOUTH DAILY 10/20/18  Yes Bedsole, Amy E, MD  famotidine (PEPCID) 20 MG tablet Take 20 mg by mouth daily. 02/09/19  Yes [provider]  fluticasone (FLOVENT HFA) 220 MCG/ACT inhaler Inhale two puffs twice daily during flare-up as needed.  Rinse, gargle, and spit after use. 02/09/19  Yes Kozlow, Donnamarie Poag, MD  acetaminophen-codeine (TYLENOL #3) 300-30 MG tablet TAKE 1 TABLET BY MOUTH EVERY 12 HOURS AS NEEDED FOR UP TO 7 DAYS FOR MODERATE PAIN 12/11/18   Bedsole, Amy E, MD  clobetasol cream (TEMOVATE) [id number] % Apply 1 application topically 2 (two) times daily.    [provider]  cyclobenzaprine (FLEXERIL) 10 MG tablet Take 1 tablet (10 mg total) by mouth at bedtime as needed for muscle spasms. 12/11/18   Bedsole, Amy E, MD  EPINEPHrine 0.3 mg/0.3 mL IJ SOAJ injection USE AS DIRECTED FOR LIFE THREATENING ALLERGIC REACTIONS 01/27/19   Kozlow, Donnamarie Poag, MD  fluconazole (DIFLUCAN) 150 MG tablet Take 1 tab po today, repeat in 7 days if needed 01/05/19   Copland, Frederico Hamman, MD  fluticasone-salmeterol (ADVAIR HFA) 230-21 MCG/ACT inhaler Inhale two puffs twice daily to prevent cough or wheeze. Rinse mouth after use. 01/27/19   Kozlow, Donnamarie Poag, MD  furosemide (LASIX) 40 MG tablet TAKE 1 TABLET(40 MG) BY MOUTH DAILY 07/28/18   Croitoru, Dani Gobble, MD  gabapentin (NEURONTIN) 600 MG tablet Take one at breakfast, one at lunch, 2 at bedtime 04/02/19   Hyatt, Max T, DPM  GLYXAMBI 10-5 MG TABS TAKE 1 TABLET BY MOUTH DAILY 03/16/19   Philemon Kingdom, MD  hydrOXYzine (ATARAX/VISTARIL) 10 MG tablet TK 1 TO 2 TS PO HS 10/01/17   [provider]  ipratropium (ATROVENT) 0.06 % nasal spray Place 1-2 sprays each nostril 1-2 times per day 11/18/18   [name], MD  irbesartan (AVAPRO) 150 MG tablet TAKE 1 TABLET(150 MG) BY MOUTH DAILY 07/22/18   Croitoru, Mihai, MD  Lactase  (DAIRY-RELIEF PO) Take 1 capsule by mouth daily as needed (stomach upset).     [provider]  levalbuterol Penne Lash) 1.25 MG/3ML nebulizer solution Take 1.25 mg by nebulization every 6 (six) hours as needed for wheezing or shortness of breath. 11/27/18   Kozlow, Donnamarie Poag, MD  Magnesium 250 MG TABS Take 1 tablet by mouth daily.    [provider]  methimazole (TAPAZOLE) 5 MG tablet Take 2.5 mg daily by mouth, with meals 10/06/18   Philemon Kingdom, MD  montelukast (SINGULAIR) 10 MG tablet TAKE 1 TABLET BY MOUTH EVERY DAY 12/15/18   Kozlow, Donnamarie Poag, MD  [facility] DELICA LANCETS [id number] MISC as needed (blood check).  11/03/14   [provider]  [facility] VERIO test strip 1 each by Other route as needed (blood monitoring).  11/03/14   [provider]  oxybutynin (DITROPAN-XL) 10 MG 24 hr tablet oxybutynin chloride ER 10 mg tablet,extended release 24 hr    [provider]  pantoprazole (PROTONIX) 40 MG tablet Take 1 tablet (40 mg total) by mouth daily. 01/27/19   Kozlow, Donnamarie Poag, MD  PATADAY 0.2 % SOLN INT 1 GTT IN OU QD PRN 01/27/17   [provider]  polyethylene glycol (MIRALAX / GLYCOLAX) packet Take 17 g by mouth daily.    [provider]  Potassium 99 MG TABS Take 99 mg by mouth daily.    [provider]  predniSONE (DELTASONE) 5 MG tablet prednisone 5 mg tablet  1 tab tid x 2 days, 1 tab bid x 5 days, 1 tab qd till finished    [provider]  Probiotic Product (ALIGN) 4 MG CAPS Take 1 capsule by mouth daily.    [provider]  rivaroxaban (XARELTO) 20 MG TABS tablet Take 1 tablet (20 mg total) by mouth daily with supper. 02/20/19   Croitoru, Mihai, MD  SUMAtriptan (IMITREX) 100 MG tablet Take 100 mg x 1 may, repeat in 2 hour for migraine. 11/05/16   Bedsole, Amy E, MD  Tiotropium Bromide Monohydrate (SPIRIVA RESPIMAT) 1.25 MCG/ACT AERS Inhale 2 puffs into the lungs daily. 01/27/19   Kozlow, Donnamarie Poag, MD  tolterodine (DETROL  LA) 4 MG 24 hr capsule Take 4 mg by mouth daily.    [provider]  triamcinolone cream (KENALOG) 0.1 % Apply 1 application topically 2 (two) times daily. 03/10/19   Bedsole, Amy E, MD  TRULANCE 3 MG TABS Take 1 tablet by mouth daily.  12/06/18   [provider]    Family History Family History  Problem Relation Age of Onset   Hypertension Mother    Clotting disorder Mother    Breast cancer Mother    Arthritis Mother    Stroke Mother    Diabetes Mother    Cancer Brother    Alcohol abuse Father    Arthritis Sister    Diabetes Sister    Multiple sclerosis Sister    Allergies Other        grandson    Social History Social History   Tobacco Use   Smoking status: Never Smoker   Smokeless tobacco: Never Used  Substance Use Topics   Alcohol use: Yes    Alcohol/week: 0.0 standard drinks    Comment: occ glass on wine   Drug use: No     Allergies   Latex, Penicillins, Lyrica [pregabalin], Phenytoin sodium extended, and Vicodin [hydrocodone-acetaminophen]   Review of Systems Review of Systems  Constitutional: Negative.   HENT: Negative.   Respiratory: Negative.   Cardiovascular: Negative.   Neurological: Positive for dizziness and headaches.  Psychiatric/Behavioral: Negative.      Physical Exam Triage Vital Signs ED Triage Vitals  Enc Vitals Group     BP 04/08/19 1837 138/79     Pulse Rate 04/08/19 1837 64     Resp 04/08/19 1837 18     Temp 04/08/19 1837 98 F (36.7 C)     Temp Source 04/08/19 1837 Oral     SpO2 04/08/19 1837 98 %     Weight --      Height --      Head Circumference --      Peak Flow --      Pain Score 04/08/19 1832 8     Pain Loc --      Pain Edu? --  Excl. in GC? --    No data found.  Updated Vital Signs BP 138/79 (BP Location: Right Arm)    Pulse 70    Temp 98 F (36.7 C) (Oral)    Resp 18    SpO2 98%   Visual Acuity Right Eye Distance:   Left Eye Distance:   Bilateral Distance:    Right  Eye Near:   Left Eye Near:    Bilateral Near:     Physical Exam Constitutional:      Appearance: Normal appearance.  HENT:     Head: Normocephalic.     Right Ear: Tympanic membrane normal.     Left Ear: Tympanic membrane normal.  Eyes:     Extraocular Movements: Extraocular movements intact.     Pupils: Pupils are equal, round, and reactive to light.  Cardiovascular:     Pulses: Normal pulses.     Heart sounds: Normal heart sounds.  Pulmonary:     Effort: Pulmonary effort is normal.     Breath sounds: Normal breath sounds.  Neurological:     General: No focal deficit present.     Mental Status: She is alert and oriented to person, place, and time.     Comments: Finger-to-nose normal Romberg normal but tandem gait unable to accomplish      UC Treatments / Results  Labs (all labs ordered are listed, but only abnormal results are displayed) Labs Reviewed - No data to display  EKG   Radiology No results found.  Procedures Procedures (including critical care time)  Medications Ordered in UC Medications  ondansetron (ZOFRAN) injection 4 mg (has no administration in time range)    Initial Impression / Assessment and Plan / UC Course  I have reviewed the triage vital signs and the nursing notes.  Pertinent labs & imaging results that were available during my care of the patient were reviewed by me and considered in my medical decision making (see chart for details).     Dizziness and headache.  Possible migraine variant.  Possible TIA.  Will treat dizziness with Zofran.  Patient to monitor blood pressure.  She has annual exam at primary care physician later this week.  She is to go to emergency room if symptoms worsen. Final Clinical Impressions(s) / UC Diagnoses   Final diagnoses:  None   Discharge Instructions   None    ED Prescriptions    None     PDMP not reviewed this encounter.   Wardell Honour, MD 04/08/19 Lurline Hare

## 2019-04-09 ENCOUNTER — Other Ambulatory Visit (INDEPENDENT_AMBULATORY_CARE_PROVIDER_SITE_OTHER): Payer: Medicare Other

## 2019-04-09 ENCOUNTER — Telehealth: Payer: Self-pay

## 2019-04-09 ENCOUNTER — Ambulatory Visit: Payer: Medicare Other

## 2019-04-09 DIAGNOSIS — E1140 Type 2 diabetes mellitus with diabetic neuropathy, unspecified: Secondary | ICD-10-CM | POA: Diagnosis not present

## 2019-04-09 LAB — COMPREHENSIVE METABOLIC PANEL
ALT: 7 U/L (ref 0–35)
AST: 13 U/L (ref 0–37)
Albumin: 4.1 g/dL (ref 3.5–5.2)
Alkaline Phosphatase: 103 U/L (ref 39–117)
BUN: 10 mg/dL (ref 6–23)
CO2: 28 mEq/L (ref 19–32)
Calcium: 9.4 mg/dL (ref 8.4–10.5)
Chloride: 107 mEq/L (ref 96–112)
Creatinine, Ser: 0.94 mg/dL (ref 0.40–1.20)
GFR: 70.69 mL/min (ref >60.00)
Glucose, Bld: 111 mg/dL — ABNORMAL HIGH (ref 70–99)
Potassium: 4.4 mEq/L (ref 3.5–5.1)
Sodium: 143 mEq/L (ref 135–145)
Total Bilirubin: 0.6 mg/dL (ref 0.2–1.2)
Total Protein: 6.6 g/dL (ref 6.0–8.3)

## 2019-04-09 LAB — LIPID PANEL
Cholesterol: 135 mg/dL (ref 0–200)
HDL: 45.8 mg/dL (ref >39.00)
LDL Cholesterol: 71 mg/dL (ref 0–99)
NonHDL: 89.63
Total CHOL/HDL Ratio: 3
Triglycerides: 93 mg/dL (ref 0.0–149.0)
VLDL: 18.6 mg/dL (ref 0.0–40.0)

## 2019-04-09 LAB — HEMOGLOBIN A1C: Hgb A1c MFr Bld: 6.4 % (ref 4.6–6.5)

## 2019-04-09 NOTE — Telephone Encounter (Signed)
Called patient at home and cell numbers to complete her Medicare Wellness visit. Called a total of four times. Voicemail box was not available and could not leave a message. Medicare visit was cancelled.

## 2019-04-10 ENCOUNTER — Telehealth: Payer: Self-pay | Admitting: Family Medicine

## 2019-04-10 ENCOUNTER — Ambulatory Visit (INDEPENDENT_AMBULATORY_CARE_PROVIDER_SITE_OTHER): Payer: Medicare Other | Admitting: Family Medicine

## 2019-04-10 ENCOUNTER — Other Ambulatory Visit: Payer: Self-pay

## 2019-04-10 ENCOUNTER — Encounter: Payer: Medicare Other | Admitting: Family Medicine

## 2019-04-10 ENCOUNTER — Ambulatory Visit: Payer: Self-pay

## 2019-04-10 ENCOUNTER — Encounter: Payer: Self-pay | Admitting: Family Medicine

## 2019-04-10 VITALS — BP 126/74 | HR 95 | Temp 98.3°F | Ht 64.5 in | Wt 222.2 lb

## 2019-04-10 DIAGNOSIS — F325 Major depressive disorder, single episode, in full remission: Secondary | ICD-10-CM

## 2019-04-10 DIAGNOSIS — M545 Low back pain, unspecified: Secondary | ICD-10-CM

## 2019-04-10 DIAGNOSIS — I480 Paroxysmal atrial fibrillation: Secondary | ICD-10-CM | POA: Diagnosis not present

## 2019-04-10 DIAGNOSIS — R42 Dizziness and giddiness: Secondary | ICD-10-CM | POA: Diagnosis not present

## 2019-04-10 DIAGNOSIS — E1140 Type 2 diabetes mellitus with diabetic neuropathy, unspecified: Secondary | ICD-10-CM | POA: Diagnosis not present

## 2019-04-10 DIAGNOSIS — J4540 Moderate persistent asthma, uncomplicated: Secondary | ICD-10-CM

## 2019-04-10 DIAGNOSIS — E1340 Other specified diabetes mellitus with diabetic neuropathy, unspecified: Secondary | ICD-10-CM

## 2019-04-10 DIAGNOSIS — Z23 Encounter for immunization: Secondary | ICD-10-CM | POA: Diagnosis not present

## 2019-04-10 DIAGNOSIS — I272 Pulmonary hypertension, unspecified: Secondary | ICD-10-CM

## 2019-04-10 DIAGNOSIS — Z0000 Encounter for general adult medical examination without abnormal findings: Secondary | ICD-10-CM

## 2019-04-10 DIAGNOSIS — E7800 Pure hypercholesterolemia, unspecified: Secondary | ICD-10-CM

## 2019-04-10 DIAGNOSIS — G8929 Other chronic pain: Secondary | ICD-10-CM

## 2019-04-10 DIAGNOSIS — E661 Drug-induced obesity: Secondary | ICD-10-CM | POA: Diagnosis not present

## 2019-04-10 DIAGNOSIS — I10 Essential (primary) hypertension: Secondary | ICD-10-CM | POA: Diagnosis not present

## 2019-04-10 DIAGNOSIS — Z6841 Body Mass Index (BMI) 40.0 and over, adult: Secondary | ICD-10-CM

## 2019-04-10 NOTE — Progress Notes (Signed)
Chief Complaint  Patient presents with  . Medicare Wellness    History of Present Illness: HPI  The patient presents for annual medicare wellness, complete physical and review of chronic health problems. He/She also has the following acute concerns today: episode of dizziness ( not vertigo) .Marland Kitchen went to urgent care on 9/23.Marland Kitchen treated with zofran and meclizine.  Felt better. This AM felt off balance. Mild headache. Worse with bending over or moving head.   I have personally reviewed the Medicare Annual Wellness questionnaire and have noted 1. The patient's medical and social history 2. Their use of alcohol, tobacco or illicit drugs 3. Their current medications and supplements 4. The patient's functional ability including ADL's, fall risks, home safety risks and hearing or visual             impairment. 5. Diet and physical activities 6. Evidence for depression or mood disorders 7.         Updated provider list Cognitive evaluation was performed and recorded on pt medicare questionnaire form. The patients weight, height, BMI and visual acuity have been recorded in the chart  I have made referrals, counseling and provided education to the patient based review of the above and I have provided the pt with a written personalized care plan for preventive services.   Documentation of this information was scanned into the electronic record under the media tab.   Advance directives and end of life planning reviewed in detail with patient and documented in EMR. Patient given handout on advance care directives if needed. HCPOA and living will updated if needed.  Patient Care Team: [name], MD as PCP - General (Family Medicine) Croitoru, Dani Gobble, MD as PCP - Cardiology (Cardiology) Hortencia Pilar, MD as Consulting Physician (Ophthalmology) Neldon Mc, Donnamarie Poag, MD as Consulting Physician (Allergy and Immunology) Anda Kraft, MD as Consulting Physician (Endocrinology) Inocencio Homes, DPM as  Consulting Physician (Podiatry) Croitoru, Dani Gobble, MD as Consulting Physician (Cardiology) Druscilla Brownie, MD as Consulting Physician (Dermatology) Latanya Maudlin, MD as Consulting Physician (Orthopedic Surgery) Boyd Kerbs, MD as Referring Physician (Specialist) Dionisio David, [place] as Referring Physician (Chiropractic Medicine)  Fall Risk  04/10/2019 03/28/2018 12/03/2017 03/25/2017 03/21/2016  Falls in the past year? 0 No No No No  Number falls in past yr: - - - - -  Injury with Fall? - - - - -  Risk Factor Category  - - - - -  Comment - - - - -     Office Visit from 04/10/2019 in [place] at [facility] Total Score  0      Hearing Screening   Method: Audiometry   125Hz  250Hz  500Hz  1000Hz  2000Hz  3000Hz  4000Hz  6000Hz  8000Hz   Right ear:   20 20 20  20     Left ear:   20 40 20  0    Vision Screening Comments: Wears Glasses-Eye Exam with Dr. Herbert Deaner 12/30/2018  Diabetes:  Good control  Lab Results  Component Value Date   HGBA1C 6.4 04/09/2019  Using medications without difficulties: Hypoglycemic episodes:none Hyperglycemic episodes:none Feet problems:with neuropathy, no current ulcers Blood Sugars averaging: FBS 100-120 eye exam within last year: 12/2018  Using CPAP for sleep apnea.  Chronic back pain, continued.. See note from 12/2017 for summary pf past eval . Followed by Dr. Gladstone Lighter  Recent MRI lumbar spine 02/26/2018 IMPRESSION: 1. At L4-5 there is a broad-based disc bulge with a left paracentral disc protrusion. Severe bilateral facet arthropathy. Moderate-severe spinal stenosis. 2. Lumbar spine spondylosis  Using ice packs. Using tylenol 3 helps some with pain. Has appt for myelogram for further eval. Right sided back pain is better after treatment by urologist for UTI.   Indication for chronic opioid: chronic back pain Medication and dose: tylenol 3 BID # pills per month: 30/3 months Last UDS date: 04/01/18, Due Opioid Treatment Agreement signed (Y/N):  04/01/18, Due for renewal Opioid Treatment Agreement last reviewed with patient:   04/01/18 NCCSRS reviewed this encounter (include red flags):   04/10/19 no red flags   Parox afib: followed by cards: anticoag with xarelto.  Asthma, moderate persistent: Good control on medication.  Followed by allergist.  Depression; well controlled on cymbalta  Elevated Cholesterol:Good control on medication. LDL at goal. Lab Results  Component Value Date   CHOL 135 04/09/2019   HDL 45.80 04/09/2019   LDLCALC 71 04/09/2019   TRIG 93.0 04/09/2019   CHOLHDL 3 04/09/2019  Diet:  good Exercise: limited given back but wants to start water aerobic  Hypertension:   Good control  on current regimen. BP Readings from Last 3 Encounters:  04/10/19 126/74  04/08/19 138/79  04/06/19 (!) 140/92  Using medication without problems or lightheadedness: none Chest pain with exertion: none.. Pleuritic pain  Edema:none Short of breath:none Average home BPs: Other issues:    COVID 19 screen No recent travel or known exposure to [place] The patient denies respiratory symptoms of COVID 19 at this time.  The importance of social distancing was discussed today.   Review of Systems  Constitutional: Negative for chills and fever.  HENT: Negative for congestion and ear pain.   Eyes: Negative for pain and redness.  Respiratory: Negative for cough and shortness of breath.   Cardiovascular: Negative for chest pain, palpitations and leg swelling.  Gastrointestinal: Negative for abdominal pain, blood in stool, constipation, diarrhea, nausea and vomiting.  Genitourinary: Negative for dysuria.  Musculoskeletal: Positive for joint pain. Negative for falls and myalgias.  Skin: Negative for rash.  Neurological: Negative for dizziness.  Psychiatric/Behavioral: Negative for depression. The patient is not nervous/anxious.       Past Medical History:  Diagnosis Date  . Allergic rhinitis   . Allergy   .  Arthritis   . Asthma   . Chronic headache   . Diabetes mellitus   . DVT (deep venous thrombosis) ([place])   . Dyspnea   . Heart murmur   . Hypertension   . Pulmonary embolism ([place])   . Pulmonary hypertension ([place])   . Seizures ([place])     reports that she has never smoked. She has never used smokeless tobacco. She reports current alcohol use. She reports that she does not use drugs.   Current Outpatient Medications:  .  acetaminophen-codeine (TYLENOL #3) 300-30 MG tablet, TAKE 1 TABLET BY MOUTH EVERY 12 HOURS AS NEEDED FOR UP TO 7 DAYS FOR MODERATE PAIN, Disp: 60 tablet, Rfl: 0 .  albuterol (VENTOLIN HFA) 108 (90 Base) MCG/ACT inhaler, INHALE 2 PUFFS BY MOUTH INTO THE LUNGS EVERY 6 HOURS AS NEEDED FOR WHEEZING OR SHORTNESS OF BREATH, Disp: 54 g, Rfl: 0 .  azelastine (ASTELIN) 0.1 % nasal spray, USE 1-2 SPRAYS IN EACH NOSTRIL TWICE DAILY AS NEEDED, Disp: 30 mL, Rfl: 5 .  clobetasol cream (TEMOVATE) [id number] %, Apply 1 application topically 2 (two) times daily., Disp: , Rfl:  .  cyclobenzaprine (FLEXERIL) 10 MG tablet, Take 1 tablet (10 mg total) by mouth at bedtime as needed for muscle spasms., Disp: 30 tablet, Rfl: 1 .  diltiazem (CARTIA XT) 120 MG 24 hr capsule, Take 1 capsule (120 mg total) by mouth daily. KEEP OV., Disp: 30 capsule, Rfl: 2 .  DULoxetine (CYMBALTA) 30 MG capsule, TAKE 1 CAPSULE(30 MG) BY MOUTH DAILY, Disp: 30 capsule, Rfl: 5 .  EPINEPHrine 0.3 mg/0.3 mL IJ SOAJ injection, USE AS DIRECTED FOR LIFE THREATENING ALLERGIC REACTIONS, Disp: 2 each, Rfl: 2 .  famotidine (PEPCID) 20 MG tablet, Take 20 mg by mouth daily., Disp: , Rfl:  .  fluticasone (FLOVENT HFA) 220 MCG/ACT inhaler, Inhale two puffs twice daily during flare-up as needed.  Rinse, gargle, and spit after use., Disp: 12 g, Rfl: 5 .  fluticasone-salmeterol (ADVAIR HFA) 230-21 MCG/ACT inhaler, Inhale two puffs twice daily to prevent cough or wheeze. Rinse mouth after use., Disp: 12 g, Rfl: 3 .  furosemide (LASIX) 40 MG tablet,  TAKE 1 TABLET(40 MG) BY MOUTH DAILY, Disp: 30 tablet, Rfl: 3 .  gabapentin (NEURONTIN) 600 MG tablet, Take one at breakfast, one at lunch, 2 at bedtime, Disp: 360 tablet, Rfl: 1 .  GLYXAMBI 10-5 MG TABS, TAKE 1 TABLET BY MOUTH DAILY, Disp: 30 tablet, Rfl: 2 .  hydrOXYzine (ATARAX/VISTARIL) 10 MG tablet, TK 1 TO 2 TS PO HS, Disp: , Rfl: 1 .  ipratropium (ATROVENT) 0.06 % nasal spray, Place 1-2 sprays each nostril 1-2 times per day, Disp: 15 mL, Rfl: 5 .  irbesartan (AVAPRO) 150 MG tablet, TAKE 1 TABLET(150 MG) BY MOUTH DAILY, Disp: 30 tablet, Rfl: 9 .  Lactase (DAIRY-RELIEF PO), Take 1 capsule by mouth daily as needed (stomach upset). , Disp: , Rfl:  .  levalbuterol (XOPENEX) 1.25 MG/3ML nebulizer solution, Take 1.25 mg by nebulization every 6 (six) hours as needed for wheezing or shortness of breath., Disp: 1080 mL, Rfl: 1 .  Magnesium 250 MG TABS, Take 1 tablet by mouth daily., Disp: , Rfl:  .  meclizine (ANTIVERT) 12.5 MG tablet, Take 1 tablet (12.5 mg total) by mouth 3 (three) times daily as needed for dizziness., Disp: 15 tablet, Rfl: 0 .  methimazole (TAPAZOLE) 5 MG tablet, Take 2.5 mg daily by mouth, with meals, Disp: 30 tablet, Rfl: 5 .  montelukast (SINGULAIR) 10 MG tablet, TAKE 1 TABLET BY MOUTH EVERY DAY, Disp: 90 tablet, Rfl: 1 .  ONETOUCH DELICA LANCETS 99991111 MISC, as needed (blood check). , Disp: , Rfl:  .  ONETOUCH VERIO test strip, 1 each by Other route as needed (blood monitoring). , Disp: , Rfl:  .  oxybutynin (DITROPAN-XL) 10 MG 24 hr tablet, oxybutynin chloride ER 10 mg tablet,extended release 24 hr, Disp: , Rfl:  .  pantoprazole (PROTONIX) 40 MG tablet, Take 1 tablet (40 mg total) by mouth daily., Disp: 90 tablet, Rfl: 1 .  PATADAY 0.2 % SOLN, INT 1 GTT IN OU QD PRN, Disp: , Rfl: 4 .  polyethylene glycol (MIRALAX / GLYCOLAX) packet, Take 17 g by mouth daily., Disp: , Rfl:  .  Potassium 99 MG TABS, Take 99 mg by mouth daily., Disp: , Rfl:  .  predniSONE (DELTASONE) 5 MG tablet,  prednisone 5 mg tablet  1 tab tid x 2 days, 1 tab bid x 5 days, 1 tab qd till finished, Disp: , Rfl:  .  Probiotic Product (ALIGN) 4 MG CAPS, Take 1 capsule by mouth daily., Disp: , Rfl:  .  rivaroxaban (XARELTO) 20 MG TABS tablet, Take 1 tablet (20 mg total) by mouth daily with supper., Disp: 90 tablet, Rfl: 1 .  SUMAtriptan (IMITREX) 100  MG tablet, Take 100 mg x 1 may, repeat in 2 hour for migraine., Disp: 10 tablet, Rfl: 0 .  Tiotropium Bromide Monohydrate (SPIRIVA RESPIMAT) 1.25 MCG/ACT AERS, Inhale 2 puffs into the lungs daily., Disp: 4 g, Rfl: 5 .  tolterodine (DETROL LA) 4 MG 24 hr capsule, Take 4 mg by mouth daily., Disp: , Rfl:  .  triamcinolone cream (KENALOG) 0.1 %, Apply 1 application topically 2 (two) times daily., Disp: 30 g, Rfl: 0 .  TRULANCE 3 MG TABS, Take 1 tablet by mouth daily. , Disp: , Rfl:  .  fluconazole (DIFLUCAN) 150 MG tablet, Take 1 tab po today, repeat in 7 days if needed (Patient not taking: Reported on 04/10/2019), Disp: 2 tablet, Rfl: 0  Current Facility-Administered Medications:  .  omalizumab Arvid Right) injection 300 mg, 300 mg, Subcutaneous, Q28 days, Kozlow, Donnamarie Poag, MD, 300 mg at 03/06/19 1330   Observations/Objective: Blood pressure 126/74, pulse 95, temperature 98.3 F (36.8 C), temperature source Temporal, height 5' 4.5" (1.638 m), weight 222 lb 4 oz (100.8 kg), SpO2 95 %.  Physical Exam Constitutional:      General: She is not in acute distress.    Appearance: Normal appearance. She is well-developed. She is obese. She is not ill-appearing or toxic-appearing.  HENT:     Head: Normocephalic.     Right Ear: Hearing, tympanic membrane, ear canal and external ear normal. Tympanic membrane is not erythematous, retracted or bulging.     Left Ear: Hearing, tympanic membrane, ear canal and external ear normal. Tympanic membrane is not erythematous, retracted or bulging.     Nose: No mucosal edema or rhinorrhea.     Right Sinus: No maxillary sinus tenderness or  frontal sinus tenderness.     Left Sinus: No maxillary sinus tenderness or frontal sinus tenderness.     Mouth/Throat:     Pharynx: Uvula midline.  Eyes:     General: Lids are normal. Lids are everted, no foreign bodies appreciated.     Conjunctiva/sclera: Conjunctivae normal.     Pupils: Pupils are equal, round, and reactive to light.  Neck:     Musculoskeletal: Normal range of motion and neck supple.     Thyroid: No thyroid mass or thyromegaly.     Vascular: No carotid bruit.     Trachea: Trachea normal.  Cardiovascular:     Rate and Rhythm: Normal rate and regular rhythm.     Pulses: Normal pulses.     Heart sounds: Normal heart sounds, S1 normal and S2 normal. No murmur. No friction rub. No gallop.   Pulmonary:     Effort: Pulmonary effort is normal. No tachypnea or respiratory distress.     Breath sounds: Normal breath sounds. No decreased breath sounds, wheezing, rhonchi or rales.  Abdominal:     General: Bowel sounds are normal.     Palpations: Abdomen is soft.     Tenderness: There is no abdominal tenderness.  Skin:    General: Skin is warm and dry.     Findings: No rash.  Neurological:     Mental Status: She is alert.  Psychiatric:        Mood and Affect: Mood is not anxious or depressed.        Speech: Speech normal.        Behavior: Behavior normal. Behavior is cooperative.        Thought Content: Thought content normal.        Judgment: Judgment normal.  Assessment and Plan The patient's preventative maintenance and recommended screening tests for an annual wellness exam were reviewed in full today. Brought up to date unless services declined.  Counselled on the importance of diet, exercise, and its role in overall health and mortality. The patient's FH and SH was reviewed, including their home life, tobacco status, and drug and alcohol status.   Vaccines: uptodate,  given flu Colon:2014 nml repeat in 2024 Mammogram: uptodate 12/2018 Hep C:  neg nonsmoker   Diabetes mellitus with neuropathy ([place])  Good control.   Depression, major, in remission Well controlled on cymbalta  Pulmonary hypertension Followed  By pulm and cardiology.  High cholesterol Good control on medication. LDL at goal.  Asthma, moderate persistent Good control on medication.  Followed by allergist.   Paroxysmal atrial fibrillation with rapid ventricular response ([place]) followed by cards: anticoag with xarelto.  Neuropathy due to secondary diabetes Due to DM. Stable control.  Encounter for chronic pain management Indication for chronic opioid: chronic back pain Medication and dose: tylenol 3 BID # pills per month: 30/3 months Last UDS date: 04/01/18, Due Opioid Treatment Agreement signed (Y/N): 04/01/18, Due for renewal Opioid Treatment Agreement last reviewed with patient:   04/01/18 NCCSRS reviewed this encounter (include red flags):   04/10/19 no red flags   Essential hypertension, benign Well controlled. Continue current medication.     [name], MD

## 2019-04-10 NOTE — Progress Notes (Signed)
No critical labs need to be addressed urgently. We will discuss labs in detail at upcoming office visit.   

## 2019-04-10 NOTE — Telephone Encounter (Signed)
Called back and questions on lab  Answered and diet discussed

## 2019-04-10 NOTE — Telephone Encounter (Signed)
Patient is requesting a call back, She has a few more questions about her labs that she stated were discussed at her appointment today

## 2019-04-10 NOTE — Patient Instructions (Addendum)
Stop at lab for urine drug screen.  Start home desensitization exercises.  Keep working on Research scientist (life sciences).

## 2019-04-12 LAB — PAIN MGMT, PROFILE 8 W/CONF, U
6 Acetylmorphine: NEGATIVE ng/mL
Alcohol Metabolites: NEGATIVE ng/mL (ref <500)
Amphetamines: NEGATIVE ng/mL
Benzodiazepines: NEGATIVE ng/mL
Buprenorphine, Urine: NEGATIVE ng/mL
Cocaine Metabolite: NEGATIVE ng/mL
Codeine: 5911 ng/mL
Creatinine: 31.1 mg/dL
Hydrocodone: NEGATIVE ng/mL
Hydromorphone: NEGATIVE ng/mL
MDMA: NEGATIVE ng/mL
Marijuana Metabolite: NEGATIVE ng/mL
Morphine: 335 ng/mL
Norhydrocodone: NEGATIVE ng/mL
Opiates: POSITIVE ng/mL
Oxidant: NEGATIVE ug/mL
Oxycodone: NEGATIVE ng/mL
pH: 7.1 (ref 4.5–9.0)

## 2019-04-13 ENCOUNTER — Ambulatory Visit (HOSPITAL_COMMUNITY)
Admission: EM | Admit: 2019-04-13 | Discharge: 2019-04-13 | Disposition: A | Discharge status: Home / Self Care | Payer: Medicare Other | Attending: Family Medicine | Admitting: Family Medicine

## 2019-04-13 ENCOUNTER — Ambulatory Visit (INDEPENDENT_AMBULATORY_CARE_PROVIDER_SITE_OTHER): Payer: Medicare Other

## 2019-04-13 ENCOUNTER — Encounter (HOSPITAL_COMMUNITY): Payer: Self-pay

## 2019-04-13 ENCOUNTER — Telehealth: Payer: Self-pay

## 2019-04-13 ENCOUNTER — Ambulatory Visit: Payer: Self-pay

## 2019-04-13 ENCOUNTER — Other Ambulatory Visit: Payer: Self-pay

## 2019-04-13 DIAGNOSIS — K5900 Constipation, unspecified: Secondary | ICD-10-CM | POA: Diagnosis not present

## 2019-04-13 DIAGNOSIS — R112 Nausea with vomiting, unspecified: Secondary | ICD-10-CM | POA: Insufficient documentation

## 2019-04-13 DIAGNOSIS — M545 Low back pain, unspecified: Secondary | ICD-10-CM

## 2019-04-13 DIAGNOSIS — K6389 Other specified diseases of intestine: Secondary | ICD-10-CM | POA: Diagnosis not present

## 2019-04-13 DIAGNOSIS — G8929 Other chronic pain: Secondary | ICD-10-CM | POA: Diagnosis not present

## 2019-04-13 LAB — BASIC METABOLIC PANEL
Anion gap: 11 (ref 5–15)
BUN: 12 mg/dL (ref 8–23)
CO2: 26 mmol/L (ref 22–32)
Calcium: 9.2 mg/dL (ref 8.9–10.3)
Chloride: 106 mmol/L (ref 98–111)
Creatinine, Ser: 1 mg/dL (ref 0.44–1.00)
GFR calc Af Amer: >60 mL/min (ref >60)
GFR calc non Af Amer: 56 mL/min — ABNORMAL LOW (ref >60)
Glucose, Bld: 111 mg/dL — ABNORMAL HIGH (ref 70–99)
Potassium: 4.5 mmol/L (ref 3.5–5.1)
Sodium: 143 mmol/L (ref 135–145)

## 2019-04-13 LAB — CBC WITH DIFFERENTIAL/PLATELET
Abs Immature Granulocytes: 0.04 10*3/uL (ref 0.00–0.07)
Basophils Absolute: 0 10*3/uL (ref 0.0–0.1)
Basophils Relative: 1 %
Eosinophils Absolute: 0.1 10*3/uL (ref 0.0–0.5)
Eosinophils Relative: 2 %
HCT: 41.1 % (ref 36.0–46.0)
Hemoglobin: 13.2 g/dL (ref 12.0–15.0)
Immature Granulocytes: 1 %
Lymphocytes Relative: 39 %
Lymphs Abs: 2.6 10*3/uL (ref 0.7–4.0)
MCH: 23.8 pg — ABNORMAL LOW (ref 26.0–34.0)
MCHC: 32.1 g/dL (ref 30.0–36.0)
MCV: 74.2 fL — ABNORMAL LOW (ref 80.0–100.0)
Monocytes Absolute: 0.6 10*3/uL (ref 0.1–1.0)
Monocytes Relative: 9 %
Neutro Abs: 3.2 10*3/uL (ref 1.7–7.7)
Neutrophils Relative %: 48 %
Platelets: 345 10*3/uL (ref 150–400)
RBC: 5.54 MIL/uL — ABNORMAL HIGH (ref 3.87–5.11)
RDW: 16.8 % — ABNORMAL HIGH (ref 11.5–15.5)
WBC: 6.6 10*3/uL (ref 4.0–10.5)
nRBC: 0 % (ref 0.0–0.2)

## 2019-04-13 LAB — POCT URINALYSIS DIP (DEVICE)
Bilirubin Urine: NEGATIVE
Glucose, UA: >=1000 mg/dL — AB
Ketones, ur: NEGATIVE mg/dL
Leukocytes,Ua: NEGATIVE
Nitrite: NEGATIVE
Protein, ur: NEGATIVE mg/dL
Specific Gravity, Urine: 1.015 (ref 1.005–1.030)
Urobilinogen, UA: 1 mg/dL (ref 0.0–1.0)
pH: 8.5 — ABNORMAL HIGH (ref 5.0–8.0)

## 2019-04-13 NOTE — ED Provider Notes (Signed)
[place]    CSN: [id number] Arrival date & time: 04/13/19  1404      History   Chief Complaint Chief Complaint  Patient presents with  . Emesis    HPI [name] is a 73 y.o. female.   [name] presents with complaints of vomiting episodes two days ago. Started 9/25 evening and lasted 9/26. Yesterday felt nauseated but no further vomiting. Today she has been able to eat cereal without vomiting. No fevers. She had her annual physical with her PCP on 9/25. She received her flu vaccine. She doesn't feel currently nauseated. Complaints of low back pain, but this is not new for her. She takes tylenol #3 for this. No new injury. Certain movements and positions worsens the back pain. States she is supposed to be following up with orthopedics for this. She has infrequent BMs, last was last week, but denies straining. This is not new for her. Normal urination, denies any urinary symptoms. Denies  Blood or black in emesis, states it was food and stomach contents which came up with vomiting. She has appointment with her doctor again tomorrow morning. hgb A1c 6.4 on 9/24. History  Of arthritis, asthma, headache's, DM, dvt, murmurs, htn, PE, htn, seizures.    ROS per HPI, negative if not otherwise mentioned.      Past Medical History:  Diagnosis Date  . Allergic rhinitis   . Allergy   . Arthritis   . Asthma   . Chronic headache   . Diabetes mellitus   . DVT (deep venous thrombosis) ([place])   . Dyspnea   . Heart murmur   . Hypertension   . Pulmonary embolism ([place])   . Pulmonary hypertension ([place])   . Seizures [employer])     Patient Active Problem List   Diagnosis Date Noted  . Chronic pain 12/11/2018  . Left foot pain 11/14/2018  . Obstructive sleep apnea treated with continuous positive airway pressure (CPAP) 06/03/2018  . Encounter for chronic pain management 04/01/2018  . Left thyroid nodule 02/06/2018  . Insomnia secondary to chronic pain 01/26/2018  .  Circadian rhythm sleep disorder, shift work type 12/03/2017  . Class 3 drug-induced obesity with serious comorbidity and body mass index (BMI) of 40.0 to 44.9 in adult ([place]) 12/03/2017  . Graves disease 10/15/2017  . Mild obstructive sleep apnea 10/15/2017  . Hot flashes not due to menopause 09/27/2017  . Reactive monocytosis 01/11/2017  . Chronic low back pain without sciatica 12/03/2016  . Generalized abdominal pain 11/26/2016  . Dry mouth 11/01/2016  . Bilateral high frequency sensorineural hearing loss 10/24/2016  . Subjective tinnitus, bilateral 10/24/2016  . [place] intertrigo 07/24/2016  . Groin pain 04/27/2016  . Urge incontinence 03/27/2016  . Fatigue 12/09/2015  . Moderate persistent asthma 07/12/2015  . Paroxysmal atrial fibrillation with rapid ventricular response ([place]) 06/03/2015  . Counseling regarding end of life decision making 05/05/2015  . Neuropathy due to secondary diabetes ([place]) 01/11/2015  . Pain in Achilles tendon 01/11/2015  . Migraine headache without aura 08/24/2014  . Allergic rhinitis 03/25/2014  . DVT (deep venous thrombosis) hx of  03/25/2014  . Osteoarthritis of right knee 03/25/2014  .  ? of Seizure disorder 03/25/2014  . Depression, major, in remission ([place]) 03/25/2014  . Diabetes mellitus with neuropathy ([place]) 03/25/2014  . Essential hypertension, benign 03/25/2014  . GERD (gastroesophageal reflux disease) 03/25/2014  . History of pulmonary embolism 10/09/2013  . Hypercoagulable state ([place]) 01/29/2011  . Asthma, moderate persistent  01/24/2011  . Pulmonary hypertension ([place]) 01/11/2011    Past Surgical History:  Procedure Laterality Date  . ABDOMINAL HYSTERECTOMY     partial, has ovaries  . CARDIAC CATHETERIZATION  12/28/2010   Mod. pulmonary hypertension, normal coronary arteries  . DOPPLER ECHOCARDIOGRAPHY  10/08/2011   EF=>55%,mild asymmetric LVH, mod. TR, mod. PH, mild to mod LA dilatation  . KNEE ARTHROSCOPY Left   . KNEE SURGERY    .  Nuclear Stress Test  05/20/2006   No ischemia  . PARTIAL HYSTERECTOMY    . PLANTAR FASCIA SURGERY    . TONSILLECTOMY      OB History   No obstetric history on file.      Home Medications    Prior to Admission medications   Medication Sig Start Date End Date Taking? Authorizing Provider  acetaminophen-codeine (TYLENOL #3) 300-30 MG tablet TAKE 1 TABLET BY MOUTH EVERY 12 HOURS AS NEEDED FOR UP TO 7 DAYS FOR MODERATE PAIN 12/11/18   Bedsole, Amy E, MD  albuterol (VENTOLIN HFA) 108 (90 Base) MCG/ACT inhaler INHALE 2 PUFFS BY MOUTH INTO THE LUNGS EVERY 6 HOURS AS NEEDED FOR WHEEZING OR SHORTNESS OF BREATH 02/05/19   Kozlow, Donnamarie Poag, MD  azelastine (ASTELIN) 0.1 % nasal spray USE 1-2 SPRAYS IN EACH NOSTRIL TWICE DAILY AS NEEDED 01/27/19   Kozlow, Donnamarie Poag, MD  clobetasol cream (TEMOVATE) [id number] % Apply 1 application topically 2 (two) times daily.    [provider]  cyclobenzaprine (FLEXERIL) 10 MG tablet Take 1 tablet (10 mg total) by mouth at bedtime as needed for muscle spasms. 12/11/18   Bedsole, Amy E, MD  diltiazem (CARTIA XT) 120 MG 24 hr capsule Take 1 capsule (120 mg total) by mouth daily. KEEP OV. 04/16/16   Croitoru, Mihai, MD  DULoxetine (CYMBALTA) 30 MG capsule TAKE 1 CAPSULE(30 MG) BY MOUTH DAILY 10/20/18   Bedsole, Amy E, MD  EPINEPHrine 0.3 mg/0.3 mL IJ SOAJ injection USE AS DIRECTED FOR LIFE THREATENING ALLERGIC REACTIONS 01/27/19   Kozlow, Donnamarie Poag, MD  famotidine (PEPCID) 20 MG tablet Take 20 mg by mouth daily. 02/09/19   [provider]  fluconazole (DIFLUCAN) 150 MG tablet Take 1 tab po today, repeat in 7 days if needed Patient not taking: Reported on 04/10/2019 01/05/19   Copland, Frederico Hamman, MD  fluticasone (FLOVENT HFA) 220 MCG/ACT inhaler Inhale two puffs twice daily during flare-up as needed.  Rinse, gargle, and spit after use. 02/09/19   Kozlow, Donnamarie Poag, MD  fluticasone-salmeterol (ADVAIR HFA) 230-21 MCG/ACT inhaler Inhale two puffs twice daily to prevent cough or wheeze.  Rinse mouth after use. 01/27/19   Kozlow, Donnamarie Poag, MD  furosemide (LASIX) 40 MG tablet TAKE 1 TABLET(40 MG) BY MOUTH DAILY 07/28/18   Croitoru, Dani Gobble, MD  gabapentin (NEURONTIN) 600 MG tablet Take one at breakfast, one at lunch, 2 at bedtime 04/02/19   Hyatt, Max T, DPM  GLYXAMBI 10-5 MG TABS TAKE 1 TABLET BY MOUTH DAILY 03/16/19   Philemon Kingdom, MD  hydrOXYzine (ATARAX/VISTARIL) 10 MG tablet TK 1 TO 2 TS PO HS 10/01/17   [provider]  ipratropium (ATROVENT) 0.06 % nasal spray Place 1-2 sprays each nostril 1-2 times per day 11/18/18   [name], MD  irbesartan (AVAPRO) 150 MG tablet TAKE 1 TABLET(150 MG) BY MOUTH DAILY 07/22/18   Croitoru, Mihai, MD  Lactase (DAIRY-RELIEF PO) Take 1 capsule by mouth daily as needed (stomach upset).     [provider]  levalbuterol Penne Lash) 1.25  MG/3ML nebulizer solution Take 1.25 mg by nebulization every 6 (six) hours as needed for wheezing or shortness of breath. 11/27/18   Kozlow, Donnamarie Poag, MD  Magnesium 250 MG TABS Take 1 tablet by mouth daily.    [provider]  meclizine (ANTIVERT) 12.5 MG tablet Take 1 tablet (12.5 mg total) by mouth 3 (three) times daily as needed for dizziness. 04/08/19   Wardell Honour, MD  methimazole (TAPAZOLE) 5 MG tablet Take 2.5 mg daily by mouth, with meals 10/06/18   Philemon Kingdom, MD  montelukast (SINGULAIR) 10 MG tablet TAKE 1 TABLET BY MOUTH EVERY DAY 12/15/18   Kozlow, Donnamarie Poag, MD  [facility] DELICA LANCETS [id number] MISC as needed (blood check).  11/03/14   [provider]  [facility] VERIO test strip 1 each by Other route as needed (blood monitoring).  11/03/14   [provider]  oxybutynin (DITROPAN-XL) 10 MG 24 hr tablet oxybutynin chloride ER 10 mg tablet,extended release 24 hr    [provider]  pantoprazole (PROTONIX) 40 MG tablet Take 1 tablet (40 mg total) by mouth daily. 01/27/19   Kozlow, Donnamarie Poag, MD  PATADAY 0.2 % SOLN INT 1 GTT IN OU QD PRN 01/27/17   [provider]  polyethylene glycol (MIRALAX / GLYCOLAX) packet Take 17 g by mouth daily.    [provider]  Potassium 99 MG TABS Take 99 mg by mouth daily.    [provider]  predniSONE (DELTASONE) 5 MG tablet prednisone 5 mg tablet  1 tab tid x 2 days, 1 tab bid x 5 days, 1 tab qd till finished    [provider]  Probiotic Product (ALIGN) 4 MG CAPS Take 1 capsule by mouth daily.    [provider]  rivaroxaban (XARELTO) 20 MG TABS tablet Take 1 tablet (20 mg total) by mouth daily with supper. 02/20/19   Croitoru, Mihai, MD  SUMAtriptan (IMITREX) 100 MG tablet Take 100 mg x 1 may, repeat in 2 hour for migraine. 11/05/16   Bedsole, Amy E, MD  Tiotropium Bromide Monohydrate (SPIRIVA RESPIMAT) 1.25 MCG/ACT AERS Inhale 2 puffs into the lungs daily. 01/27/19   Kozlow, Donnamarie Poag, MD  tolterodine (DETROL LA) 4 MG 24 hr capsule Take 4 mg by mouth daily.    [provider]  triamcinolone cream (KENALOG) 0.1 % Apply 1 application topically 2 (two) times daily. 03/10/19   Bedsole, Amy E, MD  TRULANCE 3 MG TABS Take 1 tablet by mouth daily.  12/06/18   [provider]    Family History Family History  Problem Relation Age of Onset  . Hypertension Mother   . Clotting disorder Mother   . Breast cancer Mother   . Arthritis Mother   . Stroke Mother   . Diabetes Mother   . Cancer Brother   . Alcohol abuse Father   . Arthritis Sister   . Diabetes Sister   . Multiple sclerosis Sister   . Allergies Other        grandson    Social History Social History   Tobacco Use  . Smoking status: Never Smoker  . Smokeless tobacco: Never Used  Substance Use Topics  . Alcohol use: Yes    Alcohol/week: 0.0 standard drinks    Comment: occ glass on wine  . Drug use: No     Allergies   Latex, Penicillins, Lyrica [pregabalin], Phenytoin sodium extended, and Vicodin [hydrocodone-acetaminophen]   Review of Systems Review of Systems   Physical  Exam Triage Vital Signs  ED Triage Vitals  Enc Vitals Group     BP 04/13/19 1425 (!) 148/88     Pulse Rate 04/13/19 1425 77     Resp 04/13/19 1425 16     Temp 04/13/19 1425 98.1 F (36.7 C)     Temp Source 04/13/19 1425 Oral     SpO2 04/13/19 1425 96 %     Weight --      Height --      Head Circumference --      Peak Flow --      Pain Score 04/13/19 1424 0     Pain Loc --      Pain Edu? --      Excl. in [place]? --    No data found.  Updated Vital Signs BP (!) 148/88 (BP Location: Right Arm)   Pulse 77   Temp 98.1 F (36.7 C) (Oral)   Resp 16   SpO2 96%    Physical Exam Constitutional:      General: She is not in acute distress.    Appearance: She is well-developed.  Cardiovascular:     Rate and Rhythm: Normal rate.  Pulmonary:     Effort: Pulmonary effort is normal.  Abdominal:     Tenderness: There is abdominal tenderness in the right lower quadrant.  Skin:    General: Skin is warm and dry.  Neurological:     Mental Status: She is alert and oriented to person, place, and time.      UC Treatments / Results  Labs (all labs ordered are listed, but only abnormal results are displayed) Labs Reviewed  CBC WITH DIFFERENTIAL/PLATELET - Abnormal; Notable for the following components:      Result Value   RBC 5.54 (*)    MCV 74.2 (*)    MCH 23.8 (*)    RDW 16.8 (*)    All other components within normal limits  POCT URINALYSIS DIP (DEVICE) - Abnormal; Notable for the following components:   Glucose, UA >=1000 (*)    Hgb urine dipstick TRACE (*)    pH 8.5 (*)    All other components within normal limits  BASIC METABOLIC PANEL    EKG   Radiology No results found.  Procedures Procedures (including critical care time)  Medications Ordered in UC Medications - No data to display  Initial Impression / Assessment and Plan / UC Course  I have reviewed the triage vital signs and the nursing notes.  Pertinent labs & imaging results that were available during my care of the patient were  reviewed by me and considered in my medical decision making (see chart for details).     No further vomiting, tolerating po intake. Vitals stable. Urinating normally. Xray with constipation, encouraged use of miralax, increase fluids and fiber in diet. Bmp is still pending, cbc reassuring/unremarkable. Noted glucose to urine, however no ketones and recent a1c is fair. Encouraged continued follow up with PCP for back pain. Return precautions provided. Patient verbalized understanding and agreeable to plan.   Final Clinical Impressions(s) / UC Diagnoses   Final diagnoses:  Non-intractable vomiting with nausea, unspecified vomiting type  Chronic bilateral low back pain without sciatica     Discharge Instructions     Your initial labs are appearing well, one is still pending. I will call you if this returns concerning.  I am glad to hear your vomiting has resolved.  Your abdomen xray does demonstrate large amount of stool, I  do recommend increased use of miralax to empty as able.  Please return for any increased nausea, vomiting, pain, fevers.  Please continue to follow with your primary care provider for back pain management.     ED Prescriptions    None     PDMP not reviewed this encounter.   Zigmund Gottron, NP 04/13/19 1645

## 2019-04-13 NOTE — Telephone Encounter (Signed)
-----   Message from Philemon Kingdom, MD sent at 04/09/2019  2:49 PM EDT ----- Lenna Sciara, can you please call pt:  TFTs are normal but her her Graves' antibodies are still high.  Therefore, let's continue with the current dose of methimazole.

## 2019-04-13 NOTE — Discharge Instructions (Signed)
Your initial labs are appearing well, one is still pending. I will call you if this returns concerning.  I am glad to hear your vomiting has resolved.  Your abdomen xray does demonstrate large amount of stool, I do recommend increased use of miralax to empty as able.  Please return for any increased nausea, vomiting, pain, fevers.  Please continue to follow with your primary care provider for back pain management.

## 2019-04-13 NOTE — Telephone Encounter (Signed)
Left message for patient to return our call at [phone].  

## 2019-04-13 NOTE — ED Triage Notes (Signed)
Patient presents to Urgent Care with complaints of vomiting since getting her flu shot 3 days ago. Patient reports she vomited all day Friday afternoon and the next day.  Pt not able to get back in with her PCP until tomorrow. Pt denies vomiting the past 2 days, but has had a residual headache.

## 2019-04-13 NOTE — Telephone Encounter (Addendum)
Patient will need to come in office if she see Allie Bossier for this issue or she may schedule with PCP

## 2019-04-13 NOTE — Telephone Encounter (Signed)
Patient called with multiple complaints since having her flu vaccine on Friday.  She states that Friday night and for most of the weekend she felt nauseous and was vomiting.  She states that the vomiting has stopped and she is able to eat and drink.  She still feels some nausea. She has C/O vaginal itching which she states that every time she gets a new medication she will start this itching.  She has treated the itch with a cream given her by dr Diona Browner and today it is gone. No vaginal discharge. She has back pain but states it is chronic. She has had a headache temperature is 96 oral. She was treated for dizziness last week at urgent care. Patient has not checked her BS but is diabetic. She has just eaten. Care advice read to patient. Appointment scheduled in AM Pt is aware that she will be contacted at home and will not need to be in the office for this appointment.  She states that she has done virtual appointment in the past.  Reason for Disposition . [1] Vaginal itching AND [2] not improved > 3 days following CARE ADVICE . Taking prescription medication that could cause nausea (e.g., narcotics/opiates, antibiotics, OCPs, many others)  Answer Assessment - Initial Assessment Questions 1. SYMPTOM: "What's the main symptom you're concerned about?" (e.g., pain, itching, dryness)     itching 2. LOCATION: "Where is the  itching located?" (e.g., inside/outside, left/right)    Inside between 3. ONSET: "When did the  itching  start?"     Sunday morning 4. PAIN: "Is there any pain?" If so, ask: "How bad is it?" (Scale: 1-10; mild, moderate, severe)    no 5. ITCHING: "Is there any itching?" If so, ask: "How bad is it?" (Scale: 1-10; mild, moderate, severe)     None today 6. CAUSE: "What do you think is causing the discharge?" "Have you had the same problem before? What happened then?"     none 7. OTHER SYMPTOMS: "Do you have any other symptoms?" (e.g., fever, itching, vaginal bleeding, pain with  urination, injury to genital area, vaginal foreign body)     none 8. PREGNANCY: "Is there any chance you are pregnant?" "When was your last menstrual period?"     N/A  Answer Assessment - Initial Assessment Questions 1. NAUSEA SEVERITY: "How bad is the nausea?" (e.g., mild, moderate, severe; dehydration, weight loss)   - MILD: loss of appetite without change in eating habits   - MODERATE: decreased oral intake without significant weight loss, dehydration, or malnutrition   - SEVERE: inadequate caloric or fluid intake, significant weight loss, symptoms of dehydration    Eating and drinking 2. ONSET: "When did the nausea begin?"     Friday night 3. VOMITING: "Any vomiting?" If so, ask: "How many times today?"    None today 4. RECURRENT SYMPTOM: "Have you had nausea before?" If so, ask: "When was the last time?" "What happened that time?"     With new medication 5. CAUSE: "What do you think is causing the nausea?"     unsure 6. PREGNANCY: "Is there any chance you are pregnant?" (e.g., unprotected intercourse, missed birth control pill, broken condom)    N/A  Protocols used: VAGINAL SYMPTOMS-A-AH, NAUSEA-A-AH

## 2019-04-14 ENCOUNTER — Ambulatory Visit: Payer: Medicare Other | Admitting: Primary Care

## 2019-04-14 ENCOUNTER — Telehealth: Payer: Self-pay | Admitting: Family Medicine

## 2019-04-14 NOTE — Telephone Encounter (Signed)
Best number [phone]  Pt called wanting donna to call her  Regarding her flu shot she went to urgent care

## 2019-04-14 NOTE — Telephone Encounter (Signed)
I am sorry she felt bad. Reviewed urgent care note.. if symptoms resolved... no further eval needed.

## 2019-04-14 NOTE — Telephone Encounter (Signed)
Spoke with [name].  She states after getting her HD flu vaccine on Friday, she started vomiting Friday night and Saturday.  She was seen at the Urgent Care yesterday.  She just wanted to let Dr. Diona Browner know what was going on with her.  She states if Dr. Diona Browner needs to call her about anything after reviewing her UC notes, she can be reached on her cell phone.

## 2019-04-15 NOTE — Telephone Encounter (Signed)
Ms. Crossno notified as instructed by telephone.

## 2019-04-15 NOTE — Telephone Encounter (Signed)
Notified patient of results, she would like to know what makes the antibodies high and also says she has been taking the medication every other day, does she need to continue with that or start taking it every day now?

## 2019-04-15 NOTE — Telephone Encounter (Signed)
Per message below, continue with the same MMI dose.  We don't know what increases these antibodies, but they are a sign that the Graves activity is still high. However, as long as she is on Methimazole and her TFTs are normal, no changes are needed.

## 2019-04-16 ENCOUNTER — Ambulatory Visit: Payer: Medicare Other | Admitting: Orthotics

## 2019-04-16 ENCOUNTER — Ambulatory Visit: Payer: Medicare Other | Admitting: Podiatry

## 2019-04-16 NOTE — Telephone Encounter (Signed)
Notified patient of message from Dr. Gherghe, patient expressed understanding and agreement. No further questions.  

## 2019-04-21 ENCOUNTER — Telehealth: Payer: Self-pay

## 2019-04-21 ENCOUNTER — Other Ambulatory Visit: Payer: Self-pay

## 2019-04-21 ENCOUNTER — Ambulatory Visit (HOSPITAL_COMMUNITY)
Admission: EM | Admit: 2019-04-21 | Discharge: 2019-04-21 | Disposition: A | Discharge status: Home / Self Care | Payer: Medicare Other | Attending: Urgent Care | Admitting: Urgent Care

## 2019-04-21 ENCOUNTER — Encounter (HOSPITAL_COMMUNITY): Payer: Self-pay

## 2019-04-21 DIAGNOSIS — Z86711 Personal history of pulmonary embolism: Secondary | ICD-10-CM

## 2019-04-21 DIAGNOSIS — K219 Gastro-esophageal reflux disease without esophagitis: Secondary | ICD-10-CM

## 2019-04-21 DIAGNOSIS — E669 Obesity, unspecified: Secondary | ICD-10-CM

## 2019-04-21 DIAGNOSIS — E119 Type 2 diabetes mellitus without complications: Secondary | ICD-10-CM

## 2019-04-21 DIAGNOSIS — R0789 Other chest pain: Secondary | ICD-10-CM | POA: Diagnosis not present

## 2019-04-21 DIAGNOSIS — Z8679 Personal history of other diseases of the circulatory system: Secondary | ICD-10-CM

## 2019-04-21 DIAGNOSIS — R142 Eructation: Secondary | ICD-10-CM

## 2019-04-21 MED ORDER — GLYXAMBI 10-5 MG PO TABS: 1.0000 | ORAL_TABLET | Freq: Every day | ORAL | 2 refills | Status: DC

## 2019-04-21 MED ORDER — FAMOTIDINE 20 MG PO TABS: 20.0000 mg | ORAL_TABLET | Freq: Two times a day (BID) | ORAL | 0 refills | Status: DC

## 2019-04-21 NOTE — Telephone Encounter (Signed)
Noted  

## 2019-04-21 NOTE — ED Triage Notes (Signed)
Pt report having chest pain radiated to the throat 2 hrs ago, she drank Coke and started feeling better. Pt denies any pain at this time.

## 2019-04-21 NOTE — Telephone Encounter (Signed)
Pt said about 1 hr ago pt had mid chest pain that went up into her throat and lasted 45'- 60'; did not radiate to jaw,shoulder or arm.pt said never had CP like this before. Pt drank some Coke and she felt better. Pt was also belching. Today and yesterday pt felt very tired and did not want to do anything. GI nurse advised pt to call PCP. No CP now; No SOB,H/a or dizziness.no weakness in extremities,no vision changes, pt has had diarrhea for 2 days and that is why pt was talking with GI nurse. No travel and no known exposure to covid. Pt is in [place] in car and has no way to ck vitals. Pt will go to [facility] now since in [place]. FYI to Dr Diona Browner.

## 2019-04-21 NOTE — Discharge Instructions (Addendum)
Please take Pepcid today an hour before you eat.  If your chest pain recurs and you have sweating, belly pain, shortness of breath, nausea, vomiting, then call 911 as it may be a heart attack.  Otherwise make sure that you call your cardiologist for very close follow-up, if possible this week.

## 2019-04-21 NOTE — ED Provider Notes (Signed)
MRN: [id number] DOB: May 20, 1946  Subjective:   [name] is a 73 y.o. female presenting for evaluation on an episode of chest pain.  Patient was at a car shop, sitting when she started to have moderate to severe sharp mid to left-sided chest pain with associated burping.  Episode lasted about 45 minutes.  It has since resolved without any medication.  She has a history of atrial fibrillation, pulmonary hypertension, HTN, atrial fibrillation, chest PE and GERD.  She is supposed to be on Protonix and takes this every day.  She has not super watchful of acidic foods that would cause GERD.  She does have a cardiologist that she has not contacted yet.  Patient has well-controlled diabetes with an A1c less than 7% last tested on 04/09/2019.  Last GFR was greater than 60 from the same date.  Lipid panel has been within normal limits.   Current Facility-Administered Medications:  .  omalizumab Arvid Right) injection 300 mg, 300 mg, Subcutaneous, Q28 days, Kozlow, Donnamarie Poag, MD, 300 mg at 03/06/19 1330  Current Outpatient Medications:  .  acetaminophen-codeine (TYLENOL #3) 300-30 MG tablet, TAKE 1 TABLET BY MOUTH EVERY 12 HOURS AS NEEDED FOR UP TO 7 DAYS FOR MODERATE PAIN, Disp: 60 tablet, Rfl: 0 .  albuterol (VENTOLIN HFA) 108 (90 Base) MCG/ACT inhaler, INHALE 2 PUFFS BY MOUTH INTO THE LUNGS EVERY 6 HOURS AS NEEDED FOR WHEEZING OR SHORTNESS OF BREATH, Disp: 54 g, Rfl: 0 .  azelastine (ASTELIN) 0.1 % nasal spray, USE 1-2 SPRAYS IN EACH NOSTRIL TWICE DAILY AS NEEDED, Disp: 30 mL, Rfl: 5 .  clobetasol cream (TEMOVATE) [id number] %, Apply 1 application topically 2 (two) times daily., Disp: , Rfl:  .  cyclobenzaprine (FLEXERIL) 10 MG tablet, Take 1 tablet (10 mg total) by mouth at bedtime as needed for muscle spasms., Disp: 30 tablet, Rfl: 1 .  diltiazem (CARTIA XT) 120 MG 24 hr capsule, Take 1 capsule (120 mg total) by mouth daily. KEEP OV., Disp: 30 capsule, Rfl: 2 .  DULoxetine (CYMBALTA) 30 MG capsule, TAKE 1  CAPSULE(30 MG) BY MOUTH DAILY, Disp: 30 capsule, Rfl: 5 .  Empagliflozin-linaGLIPtin (GLYXAMBI) 10-5 MG TABS, Take 1 tablet by mouth daily., Disp: 30 tablet, Rfl: 2 .  EPINEPHrine 0.3 mg/0.3 mL IJ SOAJ injection, USE AS DIRECTED FOR LIFE THREATENING ALLERGIC REACTIONS, Disp: 2 each, Rfl: 2 .  famotidine (PEPCID) 20 MG tablet, Take 20 mg by mouth daily., Disp: , Rfl:  .  fluconazole (DIFLUCAN) 150 MG tablet, Take 1 tab po today, repeat in 7 days if needed (Patient not taking: Reported on 04/10/2019), Disp: 2 tablet, Rfl: 0 .  fluticasone (FLOVENT HFA) 220 MCG/ACT inhaler, Inhale two puffs twice daily during flare-up as needed.  Rinse, gargle, and spit after use., Disp: 12 g, Rfl: 5 .  fluticasone-salmeterol (ADVAIR HFA) 230-21 MCG/ACT inhaler, Inhale two puffs twice daily to prevent cough or wheeze. Rinse mouth after use., Disp: 12 g, Rfl: 3 .  furosemide (LASIX) 40 MG tablet, TAKE 1 TABLET(40 MG) BY MOUTH DAILY, Disp: 30 tablet, Rfl: 3 .  gabapentin (NEURONTIN) 600 MG tablet, Take one at breakfast, one at lunch, 2 at bedtime, Disp: 360 tablet, Rfl: 1 .  hydrOXYzine (ATARAX/VISTARIL) 10 MG tablet, TK 1 TO 2 TS PO HS, Disp: , Rfl: 1 .  ipratropium (ATROVENT) 0.06 % nasal spray, Place 1-2 sprays each nostril 1-2 times per day, Disp: 15 mL, Rfl: 5 .  irbesartan (AVAPRO) 150 MG tablet, TAKE 1 TABLET(150 MG) BY  MOUTH DAILY, Disp: 30 tablet, Rfl: 9 .  Lactase (DAIRY-RELIEF PO), Take 1 capsule by mouth daily as needed (stomach upset). , Disp: , Rfl:  .  levalbuterol (XOPENEX) 1.25 MG/3ML nebulizer solution, Take 1.25 mg by nebulization every 6 (six) hours as needed for wheezing or shortness of breath., Disp: 1080 mL, Rfl: 1 .  Magnesium 250 MG TABS, Take 1 tablet by mouth daily., Disp: , Rfl:  .  meclizine (ANTIVERT) 12.5 MG tablet, Take 1 tablet (12.5 mg total) by mouth 3 (three) times daily as needed for dizziness., Disp: 15 tablet, Rfl: 0 .  methimazole (TAPAZOLE) 5 MG tablet, Take 2.5 mg daily by mouth,  with meals, Disp: 30 tablet, Rfl: 5 .  montelukast (SINGULAIR) 10 MG tablet, TAKE 1 TABLET BY MOUTH EVERY DAY, Disp: 90 tablet, Rfl: 1 .  ONETOUCH DELICA LANCETS 99991111 MISC, as needed (blood check). , Disp: , Rfl:  .  ONETOUCH VERIO test strip, 1 each by Other route as needed (blood monitoring). , Disp: , Rfl:  .  oxybutynin (DITROPAN-XL) 10 MG 24 hr tablet, oxybutynin chloride ER 10 mg tablet,extended release 24 hr, Disp: , Rfl:  .  pantoprazole (PROTONIX) 40 MG tablet, Take 1 tablet (40 mg total) by mouth daily., Disp: 90 tablet, Rfl: 1 .  PATADAY 0.2 % SOLN, INT 1 GTT IN OU QD PRN, Disp: , Rfl: 4 .  polyethylene glycol (MIRALAX / GLYCOLAX) packet, Take 17 g by mouth daily., Disp: , Rfl:  .  Potassium 99 MG TABS, Take 99 mg by mouth daily., Disp: , Rfl:  .  predniSONE (DELTASONE) 5 MG tablet, prednisone 5 mg tablet  1 tab tid x 2 days, 1 tab bid x 5 days, 1 tab qd till finished, Disp: , Rfl:  .  Probiotic Product (ALIGN) 4 MG CAPS, Take 1 capsule by mouth daily., Disp: , Rfl:  .  rivaroxaban (XARELTO) 20 MG TABS tablet, Take 1 tablet (20 mg total) by mouth daily with supper., Disp: 90 tablet, Rfl: 1 .  SUMAtriptan (IMITREX) 100 MG tablet, Take 100 mg x 1 may, repeat in 2 hour for migraine., Disp: 10 tablet, Rfl: 0 .  Tiotropium Bromide Monohydrate (SPIRIVA RESPIMAT) 1.25 MCG/ACT AERS, Inhale 2 puffs into the lungs daily., Disp: 4 g, Rfl: 5 .  tolterodine (DETROL LA) 4 MG 24 hr capsule, Take 4 mg by mouth daily., Disp: , Rfl:  .  triamcinolone cream (KENALOG) 0.1 %, Apply 1 application topically 2 (two) times daily., Disp: 30 g, Rfl: 0 .  TRULANCE 3 MG TABS, Take 1 tablet by mouth daily. , Disp: , Rfl:    Allergies  Allergen Reactions  . Latex Hives  . Penicillins Swelling    Has patient had a PCN reaction causing immediate rash, facial/tongue/throat swelling, SOB or lightheadedness with hypotension patient had a PCN reaction causing severe rash involving mucus membranes or skin necrosis:  [id number] Has patient had a PCN reaction that required hospitalization/No Has patient had a PCN reaction occurring within the last 10 years: No If all of the above answers are "NO", then may proceed with Cephalosporin use.     Recardo Evangelist [Pregabalin] Other (See Comments)    Blurred vision.  Marland Kitchen Phenytoin Sodium Extended Swelling    Swelling   . Vicodin [Hydrocodone-Acetaminophen] Nausea And Vomiting    Past Medical History:  Diagnosis Date  . Allergic rhinitis   . Allergy   . Arthritis   . Asthma   . Chronic headache   .  Diabetes mellitus   . DVT (deep venous thrombosis) ([place])   . Dyspnea   . Heart murmur   . Hypertension   . Pulmonary embolism ([place])   . Pulmonary hypertension ([place])   . Seizures ([place])      Past Surgical History:  Procedure Laterality Date  . ABDOMINAL HYSTERECTOMY     partial, has ovaries  . CARDIAC CATHETERIZATION  12/28/2010   Mod. pulmonary hypertension, normal coronary arteries  . DOPPLER ECHOCARDIOGRAPHY  10/08/2011   EF=>55%,mild asymmetric LVH, mod. TR, mod. PH, mild to mod LA dilatation  . KNEE ARTHROSCOPY Left   . KNEE SURGERY    . Nuclear Stress Test  05/20/2006   No ischemia  . PARTIAL HYSTERECTOMY    . PLANTAR FASCIA SURGERY    . TONSILLECTOMY      Review of Systems  Constitutional: Negative for fever and malaise/fatigue.  HENT: Negative for congestion, ear pain, sinus pain and sore throat.   Eyes: Negative for discharge and redness.  Respiratory: Negative for cough, hemoptysis, shortness of breath and wheezing.   Cardiovascular: Positive for chest pain.  Gastrointestinal: Positive for heartburn. Negative for abdominal pain, diarrhea, nausea and vomiting.  Genitourinary: Negative for dysuria, flank pain and hematuria.  Musculoskeletal: Positive for back pain. Negative for myalgias.  Skin: Negative for rash.  Neurological: Negative for dizziness, weakness and headaches.  Psychiatric/Behavioral: Negative for depression and substance  abuse.    Objective:   Vitals: BP 122/69 (BP Location: Right Arm)   Pulse 83   Temp 98.3 F (36.8 C) (Oral)   Resp 16   SpO2 100%   Physical Exam Constitutional:      General: She is not in acute distress.    Appearance: Normal appearance. She is well-developed. She is obese. She is not ill-appearing, toxic-appearing or diaphoretic.  HENT:     Head: Normocephalic and atraumatic.     Nose: Nose normal.     Mouth/Throat:     Mouth: Mucous membranes are moist.  Eyes:     Extraocular Movements: Extraocular movements intact.     Pupils: Pupils are equal, round, and reactive to light.  Cardiovascular:     Rate and Rhythm: Normal rate. Rhythm irregular.     Pulses: Normal pulses.     Heart sounds: Normal heart sounds. No murmur. No friction rub. No gallop.   Pulmonary:     Effort: Pulmonary effort is normal. No respiratory distress.     Breath sounds: Normal breath sounds. No stridor. No wheezing, rhonchi or rales.  Skin:    General: Skin is warm and dry.     Findings: No rash.  Neurological:     Mental Status: She is alert and oriented to person, place, and time.  Psychiatric:        Mood and Affect: Mood normal.        Behavior: Behavior normal.        Thought Content: Thought content normal.     ED ECG REPORT   Date: 04/21/2019  Rate: 83 bpm  Rhythm: normal sinus rhythm  QRS Axis: normal  Intervals: normal  ST/T Wave abnormalities: nonspecific T wave changes, T wave flattening at aVL  Conduction Disutrbances:none  Narrative Interpretation: Sinus rhythm at 83 bpm with nonspecific T wave flattening at lead aVL, PACs.  T wave flattening in aVL is changed from T wave inversion on previous ECG from 03/28/2018.  Old EKG Reviewed: changes noted  I have personally reviewed the EKG tracing and agree with the  computerized printout as noted.   Assessment and Plan :   1. Atypical chest pain   2. Belching   3. Gastroesophageal reflux disease without esophagitis   4.  History of atrial fibrillation   5. History of pulmonary embolism   6. Well controlled diabetes mellitus ([place])   7. Obesity without serious comorbidity, unspecified classification, unspecified obesity type     Discussed case with Dr. Mannie Stabile.  Patient has a regular rhythm but also has history of atrial fibrillation.  She is not tachycardic.  On exam she is very well-appearing and EKG is very comparable to previous EKG.  She does not have any signs or symptoms of cardiac ischemia.  She also does not endorse any warning signs symptoms consistent with ACS or acute cardiopulmonary process.  Discussed this at length with patient and we were agreeable to manage for GERD.  She will use Pepcid and avoid acidic foods tonight.  She will maintain strict ER precautions.  Follow-up with her cardiologist in the morning. Counseled patient on potential for adverse effects with medications prescribed/recommended today, ER and return-to-clinic precautions discussed, patient verbalized understanding.    Jaynee Eagles, PA-C 04/21/19 1650

## 2019-04-22 ENCOUNTER — Telehealth: Payer: Self-pay | Admitting: Cardiovascular Disease

## 2019-04-22 NOTE — Telephone Encounter (Signed)
New message   Patient wants to know how long she should take this medication it was prescribed at [facility] on yesterday. famotidine (PEPCID) 20 MG tablet Please advise.

## 2019-04-22 NOTE — Telephone Encounter (Signed)
Patient seen at Urgent Care and was told to follow up with cardiologist. Scheduled appointment with Dr Sallyanne Kuster for tomorrow.

## 2019-04-23 ENCOUNTER — Encounter: Payer: Self-pay | Admitting: Cardiovascular Disease

## 2019-04-23 ENCOUNTER — Other Ambulatory Visit: Payer: Self-pay

## 2019-04-23 ENCOUNTER — Ambulatory Visit: Payer: Self-pay

## 2019-04-23 ENCOUNTER — Ambulatory Visit (INDEPENDENT_AMBULATORY_CARE_PROVIDER_SITE_OTHER): Payer: Medicare Other | Admitting: Cardiovascular Disease

## 2019-04-23 ENCOUNTER — Telehealth: Payer: Self-pay

## 2019-04-23 VITALS — BP 151/76 | HR 84 | Temp 97.0°F | Ht 64.5 in | Wt 222.0 lb

## 2019-04-23 DIAGNOSIS — I10 Essential (primary) hypertension: Secondary | ICD-10-CM

## 2019-04-23 DIAGNOSIS — E669 Obesity, unspecified: Secondary | ICD-10-CM | POA: Diagnosis not present

## 2019-04-23 DIAGNOSIS — E0590 Thyrotoxicosis, unspecified without thyrotoxic crisis or storm: Secondary | ICD-10-CM | POA: Diagnosis not present

## 2019-04-23 DIAGNOSIS — I480 Paroxysmal atrial fibrillation: Secondary | ICD-10-CM

## 2019-04-23 DIAGNOSIS — Z86711 Personal history of pulmonary embolism: Secondary | ICD-10-CM

## 2019-04-23 DIAGNOSIS — E1169 Type 2 diabetes mellitus with other specified complication: Secondary | ICD-10-CM

## 2019-04-23 DIAGNOSIS — I272 Pulmonary hypertension, unspecified: Secondary | ICD-10-CM

## 2019-04-23 DIAGNOSIS — Z7901 Long term (current) use of anticoagulants: Secondary | ICD-10-CM

## 2019-04-23 DIAGNOSIS — Z6837 Body mass index (BMI) 37.0-37.9, adult: Secondary | ICD-10-CM | POA: Diagnosis not present

## 2019-04-23 NOTE — Patient Instructions (Signed)
Medication Instructions:  Your physician recommends that you continue on your current medications as directed. Please refer to the Current Medication list given to you today.  If you need a refill on your cardiac medications before your next appointment, please call your pharmacy.   Lab work: None ordered If you have labs (blood work) drawn today and your tests are completely normal, you will receive your results only by: MyChart Message (if you have MyChart) OR A paper copy in the mail If you have any lab test that is abnormal or we need to change your treatment, we will call you to review the results.  Testing/Procedures: None ordered  Follow-Up: At [facility], you and your health needs are our priority.  As part of our continuing mission to provide you with exceptional heart care, we have created designated Provider Care Teams.  These Care Teams include your primary Cardiologist (physician) and Advanced Practice Providers (APPs -  Physician Assistants and Nurse Practitioners) who all work together to provide you with the care you need, when you need it. You will need a follow up appointment in 12 months.  Please call our office 2 months in advance to schedule this appointment.  You may see Mihai Croitoru, MD or one of the following Advanced Practice Providers on your designated Care Team: Hao Meng, PA-C Angela Duke, PA-C       

## 2019-04-23 NOTE — Telephone Encounter (Signed)
I may have refilled it for her as she was running out, but we never addressed her diabetes properly.  If cannot get in touch with PCP, we can try to send Farxiga 10 mg daily instead of the Glyxambi to see if it is covered.  If not, she can try Jardiance 25 mg.

## 2019-04-23 NOTE — Telephone Encounter (Signed)
Looking over our chart notes it appears that patient PCP has been managing the diabetes and we manage the thyroid. Need to clarify before making medication changes.  Please advise if Dr. Diona Browner is managing her DM.

## 2019-04-23 NOTE — Telephone Encounter (Signed)
This is managed by PCP.  Please contact them.

## 2019-04-23 NOTE — Telephone Encounter (Addendum)
This was prescribed by Dr. Cruzita Lederer.  I don't see that Dr. Diona Browner has ever prescribed this for patient.

## 2019-04-23 NOTE — Telephone Encounter (Signed)
Insurance does not cover Glyxambi is not covered by insurance.  Insurance prefers:  unknown  Please advise if you would like to change RX or do PA.

## 2019-04-23 NOTE — Progress Notes (Signed)
Cardiology Office Note    Date:  04/24/2019   ID:  Alicia, Price [birth year], MRN [id number]  PCP:  [name], MD  Cardiologist:   Sanda Klein, MD   Chief complaint: Atrial fibrillation follow-up   History of Present Illness:  [name] is a 73 y.o. female with paroxysmal atrial fibrillation, remote DVT/PE, moderate pulmonary hypertension, chronic reactive airway disease and degenerative joint disease returning for follow-up.  On October 6 she developed severe retrosternal burning after belching while she was sitting in a tire store.  The symptoms continued for about 45 minutes and improved after she was able to drink something.  She was evaluated in urgent care.  Her ECG did not show any acute changes.  Cardiac enzymes were not drawn.  She has been taking pantoprazole for longstanding reflux, but it does not seem to be working so she switched to ranitidine and subsequently to famotidine when ranitidine was pulled from the market.  She has not been troubled by dyspnea.  Back pain limits her more than shortness of breath.  She has not had any palpitations.  Her ECG performed today, 3 days after the episodes of chest discomfort shows normal sinus rhythm with PACs and no repolarization abnormalities.  She had a low risk nuclear stress test about a year ago.  Previous coronary angiography showed no evidence of CAD but she did have moderate pulmonary artery hypertension by right heart catheterization (systolic PAP 50 mmHg).  She has had a variety of noncardiac issues including recurrent issues with diarrhea (she is seeing Dr. Cristina Gong) and continued hyperthyroidism controlled on methimazole (she is seeing Dr. Cruzita Lederer).  She has intentionally lost a lot of weight.  She continues to have issues with thoracic spine related pain (Dr. Gladstone Lighter).  She does not need a furosemide except on an as-needed basis.  She has maintained the weight that she has lost with change in diabetes  regimen to include empagliflozin.  She has not had any clinically noticeable episode of atrial fibrillation. The last clinically evident episode of atrial fibrillation was more than 3 years ago. She converted spontaneously to sinus rhythm after diltiazem was added for rate control.   She is taking Xarelto without bleeding problems. She has had a previous equivocal workup for hypercoagulable conditions (lupus anticoagulant positive on one of 2 separate assays, protein S activity decreased with normal total protein S level).    Past Medical History:  Diagnosis Date  . Allergic rhinitis   . Allergy   . Arthritis   . Asthma   . Chronic headache   . Diabetes mellitus   . DVT (deep venous thrombosis) ([place])   . Dyspnea   . Heart murmur   . Hypertension   . Pulmonary embolism ([place])   . Pulmonary hypertension ([place])   . Seizures ([place])     Past Surgical History:  Procedure Laterality Date  . ABDOMINAL HYSTERECTOMY     partial, has ovaries  . CARDIAC CATHETERIZATION  12/28/2010   Mod. pulmonary hypertension, normal coronary arteries  . DOPPLER ECHOCARDIOGRAPHY  10/08/2011   EF=>55%,mild asymmetric LVH, mod. TR, mod. PH, mild to mod LA dilatation  . KNEE ARTHROSCOPY Left   . KNEE SURGERY    . Nuclear Stress Test  05/20/2006   No ischemia  . PARTIAL HYSTERECTOMY    . PLANTAR FASCIA SURGERY    . TONSILLECTOMY      Current Medications: Outpatient Medications Prior to Visit  Medication Sig Dispense Refill  .  acetaminophen-codeine (TYLENOL #3) 300-30 MG tablet TAKE 1 TABLET BY MOUTH EVERY 12 HOURS AS NEEDED FOR UP TO 7 DAYS FOR MODERATE PAIN 60 tablet 0  . albuterol (VENTOLIN HFA) 108 (90 Base) MCG/ACT inhaler INHALE 2 PUFFS BY MOUTH INTO THE LUNGS EVERY 6 HOURS AS NEEDED FOR WHEEZING OR SHORTNESS OF BREATH 54 g 0  . azelastine (ASTELIN) 0.1 % nasal spray USE 1-2 SPRAYS IN EACH NOSTRIL TWICE DAILY AS NEEDED 30 mL 5  . clobetasol cream (TEMOVATE) [id number]-[id number] % Apply 1 application topically 2 (two)  times daily.    . cyclobenzaprine (FLEXERIL) 10 MG tablet Take 1 tablet (10 mg total) by mouth at bedtime as needed for muscle spasms. 30 tablet 1  . diltiazem (CARTIA XT) 120 MG 24 hr capsule Take 1 capsule (120 mg total) by mouth daily. KEEP OV. 30 capsule 2  . DULoxetine (CYMBALTA) 30 MG capsule TAKE 1 CAPSULE(30 MG) BY MOUTH DAILY 30 capsule 5  . EPINEPHrine 0.3 mg/0.3 mL IJ SOAJ injection USE AS DIRECTED FOR LIFE THREATENING ALLERGIC REACTIONS 2 each 2  . famotidine (PEPCID) 20 MG tablet Take 1 tablet (20 mg total) by mouth 2 (two) times daily. 60 tablet 0  . fluconazole (DIFLUCAN) 150 MG tablet Take 1 tab po today, repeat in 7 days if needed 2 tablet 0  . fluticasone (FLOVENT HFA) 220 MCG/ACT inhaler Inhale two puffs twice daily during flare-up as needed.  Rinse, gargle, and spit after use. 12 g 5  . fluticasone-salmeterol (ADVAIR HFA) 230-21 MCG/ACT inhaler Inhale two puffs twice daily to prevent cough or wheeze. Rinse mouth after use. 12 g 3  . furosemide (LASIX) 40 MG tablet TAKE 1 TABLET(40 MG) BY MOUTH DAILY 30 tablet 3  . gabapentin (NEURONTIN) 600 MG tablet Take one at breakfast, one at lunch, 2 at bedtime 360 tablet 1  . hydrOXYzine (ATARAX/VISTARIL) 10 MG tablet TK 1 TO 2 TS PO HS  1  . ipratropium (ATROVENT) 0.06 % nasal spray Place 1-2 sprays each nostril 1-2 times per day 15 mL 5  . irbesartan (AVAPRO) 150 MG tablet TAKE 1 TABLET(150 MG) BY MOUTH DAILY 30 tablet 9  . Lactase (DAIRY-RELIEF PO) Take 1 capsule by mouth daily as needed (stomach upset).     Marland Kitchen levalbuterol (XOPENEX) 1.25 MG/3ML nebulizer solution Take 1.25 mg by nebulization every 6 (six) hours as needed for wheezing or shortness of breath. 1080 mL 1  . Magnesium 250 MG TABS Take 1 tablet by mouth daily.    . meclizine (ANTIVERT) 12.5 MG tablet Take 1 tablet (12.5 mg total) by mouth 3 (three) times daily as needed for dizziness. 15 tablet 0  . methimazole (TAPAZOLE) 5 MG tablet Take 2.5 mg daily by mouth, with meals 30  tablet 5  . montelukast (SINGULAIR) 10 MG tablet TAKE 1 TABLET BY MOUTH EVERY DAY 90 tablet 1  . ONETOUCH DELICA LANCETS 99991111 MISC as needed (blood check).     [name] VERIO test strip 1 each by Other route as needed (blood monitoring).     Marland Kitchen oxybutynin (DITROPAN-XL) 10 MG 24 hr tablet oxybutynin chloride ER 10 mg tablet,extended release 24 hr    . pantoprazole (PROTONIX) 40 MG tablet Take 1 tablet (40 mg total) by mouth daily. 90 tablet 1  . PATADAY 0.2 % SOLN INT 1 GTT IN OU QD PRN  4  . polyethylene glycol (MIRALAX / GLYCOLAX) packet Take 17 g by mouth daily.    . Potassium 99 MG TABS Take  99 mg by mouth daily.    . predniSONE (DELTASONE) 5 MG tablet prednisone 5 mg tablet  1 tab tid x 2 days, 1 tab bid x 5 days, 1 tab qd till finished    . Probiotic Product (ALIGN) 4 MG CAPS Take 1 capsule by mouth daily.    . rivaroxaban (XARELTO) 20 MG TABS tablet Take 1 tablet (20 mg total) by mouth daily with supper. 90 tablet 1  . SUMAtriptan (IMITREX) 100 MG tablet Take 100 mg x 1 may, repeat in 2 hour for migraine. 10 tablet 0  . Tiotropium Bromide Monohydrate (SPIRIVA RESPIMAT) 1.25 MCG/ACT AERS Inhale 2 puffs into the lungs daily. 4 g 5  . tolterodine (DETROL LA) 4 MG 24 hr capsule Take 4 mg by mouth daily.    Marland Kitchen triamcinolone cream (KENALOG) 0.1 % Apply 1 application topically 2 (two) times daily. 30 g 0  . TRULANCE 3 MG TABS Take 1 tablet by mouth daily.     . Empagliflozin-linaGLIPtin (GLYXAMBI) 10-5 MG TABS Take 1 tablet by mouth daily. 30 tablet 2   Facility-Administered Medications Prior to Visit  Medication Dose Route Frequency Provider Last Rate Last Dose  . omalizumab Arvid Right) injection 300 mg  300 mg Subcutaneous Q28 days Kozlow, Donnamarie Poag, MD   300 mg at 03/06/19 1330     Allergies:   Latex, Penicillins, Lyrica [pregabalin], Phenytoin sodium extended, and Vicodin [hydrocodone-acetaminophen]   Social History   Socioeconomic History  . Marital status: Widowed    Spouse name: Not on  file  . Number of children: Y  . Years of education: Not on file  . Highest education level: Not on file  Occupational History  . Occupation: retired Geologist, engineering.   Social Needs  . Financial resource strain: Not on file  . Food insecurity    Worry: Not on file    Inability: Not on file  . Transportation needs    Medical: Not on file    Non-medical: Not on file  Tobacco Use  . Smoking status: Never Smoker  . Smokeless tobacco: Never Used  Substance and Sexual Activity  . Alcohol use: Yes    Alcohol/week: 0.0 standard drinks    Comment: occ glass on wine  . Drug use: No  . Sexual activity: Never  Lifestyle  . Physical activity    Days per week: Not on file    Minutes per session: Not on file  . Stress: Not on file  Relationships  . Social Herbalist on phone: Not on file    Gets together: Not on file    Attends religious service: Not on file    Active member of club or organization: Not on file    Attends meetings of clubs or organizations: Not on file    Relationship status: Not on file  Other Topics Concern  . Not on file  Social History Narrative   Widow    limited exercise.     Family History:  The patient's family history includes Alcohol abuse in her father; Allergies in an other family member; Arthritis in her mother and sister; Breast cancer in her mother; Cancer in her brother; Clotting disorder in her mother; Diabetes in her mother and sister; Hypertension in her mother; Multiple sclerosis in her sister; Stroke in her mother.   ROS:   Please see the history of present illness.    ROS All other systems reviewed and are negative.   PHYSICAL EXAM:  VS:  BP (!) 151/76   Pulse 84   Temp (!) 97 F (36.1 C)   Ht 5' 4.5" (1.638 m)   Wt 222 lb (100.7 kg)   SpO2 94%   BMI 37.52 kg/m       General: Alert, oriented x3, no distress, severely obese Head: no evidence of trauma, PERRL, EOMI, no exophtalmos or lid lag, no myxedema, no xanthelasma;  normal ears, nose and oropharynx Neck: normal jugular venous pulsations and no hepatojugular reflux; brisk carotid pulses without delay and no carotid bruits Chest: clear to auscultation, no signs of consolidation by percussion or palpation, normal fremitus, symmetrical and full respiratory excursions Cardiovascular: normal position and quality of the apical impulse, regular rhythm with infrequent ectopy, normal first and second heart sounds, no murmurs, rubs or gallops Abdomen: no tenderness or distention, no masses by palpation, no abnormal pulsatility or arterial bruits, normal bowel sounds, no hepatosplenomegaly Extremities: no clubbing, cyanosis or edema; 2+ radial, ulnar and brachial pulses bilaterally; 2+ right femoral, posterior tibial and dorsalis pedis pulses; 2+ left femoral, posterior tibial and dorsalis pedis pulses; no subclavian or femoral bruits Neurological: grossly nonfocal Psych: Normal mood and affect    Wt Readings from Last 3 Encounters:  04/23/19 222 lb (100.7 kg)  04/10/19 222 lb 4 oz (100.8 kg)  03/05/19 223 lb 3.2 oz (101.2 kg)      Studies/Labs Reviewed:   EKG:  EKG is ordered today.  It shows sinus rhythm with premature atrial complexes and normal repolarization pattern.  QTc 434 ms  Recent Labs: 04/06/2019: TSH 1.39 04/09/2019: ALT 7 04/13/2019: BUN 12; Creatinine, Ser 1.00; Hemoglobin 13.2; Platelets 345; Potassium 4.5; Sodium 143   Lipid Panel    Component Value Date/Time   CHOL 135 04/09/2019 1332   TRIG 93.0 04/09/2019 1332   HDL 45.80 04/09/2019 1332   CHOLHDL 3 04/09/2019 1332   VLDL 18.6 04/09/2019 1332   LDLCALC 71 04/09/2019 1332     ASSESSMENT:    1. Paroxysmal atrial fibrillation (HCC)   2. Essential hypertension   3. History of pulmonary embolism   4. Pulmonary hypertension ([place])   5. Diabetes mellitus type 2 in obese (HCC)   6. Class 2 severe obesity due to excess calories with serious comorbidity and body mass index (BMI) of 37.0  to 37.9 in adult ([place])   7. Hyperthyroidism   8. Long term current use of anticoagulant      PLAN:  In order of problems listed above:  1. AFib: It's been a long time since he had a clinically detectable episode. CHADSVasc 4 (age, gender, HTN, DM).  Losing weight may further decrease the burden of atrial arrhythmia. 2. HTN: Usually well-controlled, elevated today due to back pain. Avoid beta blockers due to reactive airway disease.  No changes made to her medication list. 3. Hx DVT/PE conflicting results on previous hypercoagulable workup, but this is probably a moot point since she needs to remain on anticoagulation for atrial fibrillation anyway. 4. PAH:  etiology unclear, suspect contribution of obesity, previous thromboembolic events and possibly some diastolic left heart dysfunction. 5. DM: Excellent control, recent hemoglobin A1c 6.4% 6. HLP: All lipid parameters are at target. 7. Obesity: Making great progress with SGLT2 inhibitors, but remains in the severely obese range. 8. Hyperthyroidism: TSH and T4 are in normal range on current antithyroid medications, but she continues to have high titers of antithyroid antibodies 9. Xarelto: Indicated both for atrial fibrillation and history of venous thromboembolism.  No  bleeding complications.   Medication Adjustments/Labs and Tests Ordered: Current medicines are reviewed at length with the patient today.  Concerns regarding medicines are outlined above.  Medication changes, Labs and Tests ordered today are listed in the Patient Instructions below: Patient Instructions  Medication Instructions:  Your physician recommends that you continue on your current medications as directed. Please refer to the Current Medication list given to you today.  If you need a refill on your cardiac medications before your next appointment, please call your pharmacy.   Lab work: None ordered If you have labs (blood work) drawn today and your tests are  completely normal, you will receive your results only by: [place] (if you have MyChart) OR A paper copy in the mail If you have any lab test that is abnormal or we need to change your treatment, we will call you to review the results.  Testing/Procedures: None ordered  Follow-Up: At [facility], you and your health needs are our priority.  As part of our continuing mission to provide you with exceptional heart care, we have created designated Provider Care Teams.  These Care Teams include your primary Cardiologist (physician) and Advanced Practice Providers (APPs -  Physician Assistants and Nurse Practitioners) who all work together to provide you with the care you need, when you need it. You will need a follow up appointment in 12 months.  Please call our office 2 months in advance to schedule this appointment.  You may see Sanda Klein, MD or one of the following Advanced Practice Providers on your designated Care Team: [name], PA-C Fabian Sharp, [place]          Signed, Sanda Klein, MD  04/24/2019 2:59 PM    [address], [place], [place]  [address] Phone: [phone]; Fax: [phone]

## 2019-04-24 ENCOUNTER — Ambulatory Visit: Payer: Medicare Other | Admitting: Orthotics

## 2019-04-24 MED ORDER — DAPAGLIFLOZIN PROPANEDIOL 10 MG PO TABS: 10.0000 mg | ORAL_TABLET | Freq: Every day | ORAL | 11 refills | Status: DC

## 2019-04-24 NOTE — Addendum Note (Signed)
Addended by: [name] E on: 04/24/2019 12:56 PM   Modules accepted: Orders

## 2019-04-24 NOTE — Telephone Encounter (Signed)
Ms. Cascino call her insurance and they said the [facility] needs to PA.  Do you want me to attempt the PA and keep patient on Glyxambi?  PA phone number [phone].

## 2019-04-24 NOTE — Telephone Encounter (Addendum)
I believe her previous Endo was prescribing this (Dr. Wilson Singer).. she has been well controlled on it since and I have not had to make any changes or refill.  I would be happy to continue following.  As far as replacement for this med, I will go with Dr. Arman Filter recommendations on changing to farxiga 10 mg daily as Glyxambi is not a medication I typically use.Marland Kitchen   GFR 56  Lab Results  Component Value Date   HGBA1C 6.4 04/09/2019   FYI: Dr. Cruzita Lederer and Cardell Peach, [place]

## 2019-04-24 NOTE — Telephone Encounter (Signed)
[name] notified as instructed by telephone.

## 2019-04-24 NOTE — Telephone Encounter (Signed)
Left message for [name] to return my call.

## 2019-04-24 NOTE — Telephone Encounter (Signed)
Yes attempt the PA.. will likely have to ask pt what she has tried in past

## 2019-04-27 MED ORDER — GLYXAMBI 10-5 MG PO TABS: 1.0000 | ORAL_TABLET | Freq: Every day | ORAL | 2 refills | Status: DC

## 2019-04-27 NOTE — Addendum Note (Signed)
Addended by: Carter Kitten on: 04/27/2019 10:40 AM   Modules accepted: Orders

## 2019-04-27 NOTE — Addendum Note (Signed)
Addended by: Carter Kitten on: 04/27/2019 12:51 PM   Modules accepted: Orders

## 2019-04-27 NOTE — Telephone Encounter (Addendum)
PA completed for Glyxambi 10 mg/5mg  by telephone and approved through 04/26/2020.  [facility] notified of Glyxambi 10 mg/5mg  approval via fax.  Left message for Ms. Sawin that PA was approved for the [employer].  Medication list updated.

## 2019-04-28 ENCOUNTER — Ambulatory Visit
Admission: RE | Admit: 2019-04-28 | Discharge: 2019-04-28 | Disposition: A | Discharge status: Home / Self Care | Payer: Medicare Other | Source: Ambulatory Visit | Attending: Gastroenterology | Admitting: Gastroenterology

## 2019-04-28 ENCOUNTER — Other Ambulatory Visit: Payer: Self-pay | Admitting: Gastroenterology

## 2019-04-28 ENCOUNTER — Other Ambulatory Visit: Payer: Self-pay

## 2019-04-28 ENCOUNTER — Other Ambulatory Visit: Payer: Self-pay | Admitting: Family Medicine

## 2019-04-28 DIAGNOSIS — K5900 Constipation, unspecified: Secondary | ICD-10-CM

## 2019-04-28 MED ORDER — ACETAMINOPHEN-CODEINE #3 300-30 MG PO TABS: ORAL_TABLET | ORAL | 0 refills | Status: DC

## 2019-04-28 NOTE — Telephone Encounter (Signed)
Spoke with Ms. Romenesko.  She thanked [place] for getting the glyxambi approved through insurance.  She is also requesting refill on her Tylenol #3.  Last office visit 04/10/2019 [facility]V.  Last refilled 12/11/2018 for #60 with no refills. UDS/Contract 04/10/2019. CPE scheduled for 04/19/2020.

## 2019-04-28 NOTE — Telephone Encounter (Signed)
Patient's requesting [name] return call about her insurance.

## 2019-04-30 ENCOUNTER — Other Ambulatory Visit: Payer: Self-pay | Admitting: Gastroenterology

## 2019-04-30 ENCOUNTER — Ambulatory Visit
Admission: RE | Admit: 2019-04-30 | Discharge: 2019-04-30 | Disposition: A | Discharge status: Home / Self Care | Payer: Medicare Other | Source: Ambulatory Visit | Attending: Gastroenterology | Admitting: Gastroenterology

## 2019-04-30 DIAGNOSIS — K5900 Constipation, unspecified: Secondary | ICD-10-CM

## 2019-04-30 DIAGNOSIS — H903 Sensorineural hearing loss, bilateral: Secondary | ICD-10-CM | POA: Diagnosis not present

## 2019-04-30 DIAGNOSIS — H9313 Tinnitus, bilateral: Secondary | ICD-10-CM | POA: Diagnosis not present

## 2019-05-04 ENCOUNTER — Ambulatory Visit
Admission: RE | Admit: 2019-05-04 | Discharge: 2019-05-04 | Disposition: A | Discharge status: Home / Self Care | Payer: Medicare Other | Source: Ambulatory Visit | Attending: Gastroenterology | Admitting: Gastroenterology

## 2019-05-04 ENCOUNTER — Other Ambulatory Visit: Payer: Self-pay

## 2019-05-04 ENCOUNTER — Other Ambulatory Visit: Payer: Self-pay | Admitting: Gastroenterology

## 2019-05-04 DIAGNOSIS — K5900 Constipation, unspecified: Secondary | ICD-10-CM

## 2019-05-05 ENCOUNTER — Ambulatory Visit: Payer: Self-pay

## 2019-05-05 DIAGNOSIS — K5901 Slow transit constipation: Secondary | ICD-10-CM | POA: Diagnosis not present

## 2019-05-07 DIAGNOSIS — R42 Dizziness and giddiness: Secondary | ICD-10-CM | POA: Insufficient documentation

## 2019-05-07 DIAGNOSIS — J4550 Severe persistent asthma, uncomplicated: Secondary | ICD-10-CM

## 2019-05-07 DIAGNOSIS — E1169 Type 2 diabetes mellitus with other specified complication: Secondary | ICD-10-CM | POA: Insufficient documentation

## 2019-05-07 DIAGNOSIS — E7800 Pure hypercholesterolemia, unspecified: Secondary | ICD-10-CM | POA: Insufficient documentation

## 2019-05-07 NOTE — Assessment & Plan Note (Signed)
Indication for chronic opioid: chronic back pain Medication and dose: tylenol 3 BID # pills per month: 30/3 months Last UDS date: 04/01/18, Due Opioid Treatment Agreement signed (Y/N): 04/01/18, Due for renewal Opioid Treatment Agreement last reviewed with patient:   04/01/18 [facility] reviewed this encounter (include red flags):   04/10/19 no red flags

## 2019-05-07 NOTE — Assessment & Plan Note (Signed)
Good control on medication.  Followed by allergist.

## 2019-05-07 NOTE — Assessment & Plan Note (Signed)
Due to DM. Stable control.

## 2019-05-07 NOTE — Assessment & Plan Note (Signed)
Well controlled. Continue current medication.  

## 2019-05-07 NOTE — Assessment & Plan Note (Signed)
Followed  By pulm and cardiology.

## 2019-05-07 NOTE — Assessment & Plan Note (Signed)
followed by cards: anticoag with xarelto.

## 2019-05-07 NOTE — Assessment & Plan Note (Signed)
Good control

## 2019-05-07 NOTE — Assessment & Plan Note (Signed)
Well controlled on cymbalta

## 2019-05-07 NOTE — Assessment & Plan Note (Signed)
Good control on medication. LDL at goal.

## 2019-05-07 NOTE — Assessment & Plan Note (Signed)
Start home desensitization exercises.

## 2019-05-08 ENCOUNTER — Telehealth: Payer: Self-pay | Admitting: Family Medicine

## 2019-05-08 ENCOUNTER — Ambulatory Visit (INDEPENDENT_AMBULATORY_CARE_PROVIDER_SITE_OTHER): Payer: Medicare Other

## 2019-05-08 ENCOUNTER — Other Ambulatory Visit: Payer: Self-pay

## 2019-05-08 DIAGNOSIS — J4550 Severe persistent asthma, uncomplicated: Secondary | ICD-10-CM

## 2019-05-08 NOTE — Telephone Encounter (Signed)
Cover my meds called to verify that we received the PA request for the patient's FARXIGA.  They asked [place] to give them a call if we have any questions or if we have not received the request.   Phone- [phone] KEY # [id number]

## 2019-05-11 NOTE — Telephone Encounter (Signed)
Spoke with [employer]Meds and advised them that patient is not going to be taking the [place] and they can cancel that PA.  We were able to get her Glyxambi approved instead.

## 2019-05-17 ENCOUNTER — Other Ambulatory Visit: Payer: Self-pay | Admitting: Family Medicine

## 2019-05-22 ENCOUNTER — Telehealth: Payer: Self-pay | Admitting: Family Medicine

## 2019-05-22 NOTE — Telephone Encounter (Signed)
Spoke with [name].  She just bought some vitamins and wants to drop them by the office on Monday to have Dr. Diona Browner look at them to make sure they are safe for her to take along with her other medications.  She will drop the off to me on Monday so Dr. Diona Browner can look at them on Tuesday when she is in the office.  FYI to Dr. Diona Browner.

## 2019-05-22 NOTE — Telephone Encounter (Signed)
Noted  

## 2019-05-22 NOTE — Telephone Encounter (Signed)
Patient said [name] asked her to call when she received her vitamins.  Patient received her vitamins in the mail yesterday.

## 2019-05-26 ENCOUNTER — Other Ambulatory Visit: Payer: Self-pay

## 2019-05-26 ENCOUNTER — Encounter: Payer: Self-pay | Admitting: Allergy and Immunology

## 2019-05-26 ENCOUNTER — Ambulatory Visit (INDEPENDENT_AMBULATORY_CARE_PROVIDER_SITE_OTHER): Payer: Medicare Other | Admitting: Allergy and Immunology

## 2019-05-26 VITALS — BP 141/80 | HR 96 | Temp 97.0°F | Resp 18

## 2019-05-26 DIAGNOSIS — J3089 Other allergic rhinitis: Secondary | ICD-10-CM

## 2019-05-26 DIAGNOSIS — K219 Gastro-esophageal reflux disease without esophagitis: Secondary | ICD-10-CM

## 2019-05-26 DIAGNOSIS — J4550 Severe persistent asthma, uncomplicated: Secondary | ICD-10-CM | POA: Diagnosis not present

## 2019-05-26 NOTE — Progress Notes (Signed)
[place] [facility] [place] [facility] [place]   Follow-up Note  Referring Provider: [name], MD Primary Provider: [name], MD Date of Office Visit: 05/26/2019  Subjective:   [name] (DOB: [birth year]) is a 73 y.o. female who returns to the [facility] on 05/26/2019 in re-evaluation of the following:  HPI: Yarethzi returns to this clinic in evaluation of asthma and allergic rhinitis and LPR and history of pulmonary hypertension, diastolic dysfunction, and sleep apnea.  Her last visit to this clinic was 27 January 2019.  Overall it sounds as though she has done relatively well regarding her lower airway issue.  She continues on a combination of Advair and Spiriva and montelukast and omalizumab.  It does not sound as though she has required a systemic steroid to treat an exacerbation of asthma.  Her requirement for short acting bronchodilator averages from 1 time per week to twice a day.  She has not had to add Flovent as part of an action plan.  It should be noted that this degree of control occurs in the context of utilizing 2 systemic steroids for a back issue since her last visit.  She cannot exercise because of a back and knee issue.  She has had very little problems with her upper airway as well.  It does not sound as though she has required an antibiotic to treat an episode of sinusitis.  She believes that her reflux is going very well at this point in time.  She continues on a proton pump inhibitor and H2 receptor blocker.  She continues on CPAP for her sleep apnea.  Apparently she has been to the urgent care center 3 times since I have last seen her.  One was for unrelenting vomiting of 2 days duration immediately starting after receiving her flu vaccine in September.  The other was for a chest pain evaluation which apparently may have been a reflux event.  And then there was another visit for the urgent care center that she cannot remember  the exact reason.  She also states that she is just having a lot of fatigue recently.  It should be noted that she is using Tylenol 3 for knee pain and back pain.  Her average use of Tylenol 3 is 2 tablets/day but sometimes she can increases usage up to every 4-6 hours.  Regarding her back pain, she is significantly impaired regarding mobility because of this back pain and knee pain affecting predominantly her right knee.  She sees orthopedics for this issue and apparently there is not a plan in place to further evaluate or treat this musculoskeletal dysfunction.  She has already undergone injections in her back and is already undergone a trial of physical therapy none of which has really helped her very much.  In review of her MRI of back performed 26 February 2018 she has a very broad-based disc bulge at L4-5 with a left paracentral disc protrusion and severe bilateral facet arthropathy with moderate to severe spinal stenosis.  Allergies as of 05/26/2019      Reactions   Latex Hives   Penicillins Swelling   Has patient had a PCN reaction causing immediate rash, facial/tongue/throat swelling, SOB or lightheadedness with hypotension patient had a PCN reaction causing severe rash involving mucus membranes or skin necrosis: [id number] Has patient had a PCN reaction that required hospitalization/No Has patient had a PCN reaction occurring within the last 10 years: No If all of  the above answers are "NO", then may proceed with Cephalosporin use.   Lyrica [pregabalin] Other (See Comments)   Blurred vision.   Phenytoin Sodium Extended Swelling   Swelling    Vicodin [hydrocodone-acetaminophen] Nausea And Vomiting      Medication List      acetaminophen-codeine 300-30 MG tablet Commonly known as: TYLENOL #3 TAKE 1 TABLET BY MOUTH EVERY 12 HOURS AS NEEDED FOR UP TO 7 DAYS FOR MODERATE PAIN   Advair HFA 230-21 MCG/ACT inhaler Generic drug: fluticasone-salmeterol Inhale two puffs twice daily to  prevent cough or wheeze. Rinse mouth after use.   albuterol 108 (90 Base) MCG/ACT inhaler Commonly known as: VENTOLIN HFA INHALE 2 PUFFS BY MOUTH INTO THE LUNGS EVERY 6 HOURS AS NEEDED FOR WHEEZING OR SHORTNESS OF BREATH   Align 4 MG Caps Take 1 capsule by mouth daily.   azelastine 0.1 % nasal spray Commonly known as: Astelin USE 1-2 SPRAYS IN EACH NOSTRIL TWICE DAILY AS NEEDED   clobetasol cream 0.05 % Commonly known as: TEMOVATE Apply 1 application topically 2 (two) times daily.   cyclobenzaprine 10 MG tablet Commonly known as: FLEXERIL Take 1 tablet (10 mg total) by mouth at bedtime as needed for muscle spasms.   DAIRY-RELIEF PO Take 1 capsule by mouth daily as needed (stomach upset).   diltiazem 120 MG 24 hr capsule Commonly known as: Cartia XT Take 1 capsule (120 mg total) by mouth daily. KEEP OV.   DULoxetine 30 MG capsule Commonly known as: CYMBALTA TAKE 1 CAPSULE(30 MG) BY MOUTH DAILY   EPINEPHrine 0.3 mg/0.3 mL Soaj injection Commonly known as: EPI-PEN USE AS DIRECTED FOR LIFE THREATENING ALLERGIC REACTIONS   famotidine 20 MG tablet Commonly known as: PEPCID Take 1 tablet (20 mg total) by mouth 2 (two) times daily.   Flovent HFA 220 MCG/ACT inhaler Generic drug: fluticasone Inhale two puffs twice daily during flare-up as needed.  Rinse, gargle, and spit after use.   fluconazole 150 MG tablet Commonly known as: DIFLUCAN Take 1 tab po today, repeat in 7 days if needed   furosemide 40 MG tablet Commonly known as: LASIX TAKE 1 TABLET(40 MG) BY MOUTH DAILY   gabapentin 600 MG tablet Commonly known as: Neurontin Take one at breakfast, one at lunch, 2 at bedtime   Glyxambi 10-5 MG Tabs Generic drug: Empagliflozin-linaGLIPtin Take 1 tablet by mouth daily.   hydrOXYzine 10 MG tablet Commonly known as: ATARAX/VISTARIL TK 1 TO 2 TS PO HS   ipratropium 0.06 % nasal spray Commonly known as: ATROVENT Place 1-2 sprays each nostril 1-2 times per day    irbesartan 150 MG tablet Commonly known as: AVAPRO TAKE 1 TABLET(150 MG) BY MOUTH DAILY   levalbuterol 1.25 MG/3ML nebulizer solution Commonly known as: XOPENEX Take 1.25 mg by nebulization every 6 (six) hours as needed for wheezing or shortness of breath.   Magnesium 250 MG Tabs Take 1 tablet by mouth daily.   meclizine 12.5 MG tablet Commonly known as: ANTIVERT Take 1 tablet (12.5 mg total) by mouth 3 (three) times daily as needed for dizziness.   methimazole 5 MG tablet Commonly known as: TAPAZOLE Take 2.5 mg daily by mouth, with meals   montelukast 10 MG tablet Commonly known as: SINGULAIR TAKE 1 TABLET BY MOUTH EVERY DAY   OneTouch Delica Lancets 99991111 Misc as needed (blood check).   OneTouch Verio test strip Generic drug: glucose blood 1 each by Other route as needed (blood monitoring).   oxybutynin 10 MG 24 hr  tablet Commonly known as: DITROPAN-XL oxybutynin chloride ER 10 mg tablet,extended release 24 hr   pantoprazole 40 MG tablet Commonly known as: PROTONIX Take 1 tablet (40 mg total) by mouth daily.   Pataday 0.2 % Soln Generic drug: Olopatadine HCl INT 1 GTT IN OU QD PRN   polyethylene glycol 17 g packet Commonly known as: MIRALAX / GLYCOLAX Take 17 g by mouth daily.   Potassium 99 MG Tabs Take 99 mg by mouth daily.     rivaroxaban 20 MG Tabs tablet Commonly known as: Xarelto Take 1 tablet (20 mg total) by mouth daily with supper.   Spiriva Respimat 1.25 MCG/ACT Aers Generic drug: Tiotropium Bromide Monohydrate Inhale 2 puffs into the lungs daily.   SUMAtriptan 100 MG tablet Commonly known as: IMITREX Take 100 mg x 1 may, repeat in 2 hour for migraine.   tolterodine 4 MG 24 hr capsule Commonly known as: DETROL LA Take 4 mg by mouth daily.   triamcinolone cream 0.1 % Commonly known as: KENALOG Apply 1 application topically 2 (two) times daily.   Trulance 3 MG Tabs Generic drug: Plecanatide Take 1 tablet by mouth daily.       Past  Medical History:  Diagnosis Date  . Allergic rhinitis   . Allergy   . Arthritis   . Asthma   . Chronic headache   . Diabetes mellitus   . DVT (deep venous thrombosis) ([place])   . Dyspnea   . Heart murmur   . Hypertension   . Pulmonary embolism ([place])   . Pulmonary hypertension ([place])   . Seizures ([place])     Past Surgical History:  Procedure Laterality Date  . ABDOMINAL HYSTERECTOMY     partial, has ovaries  . CARDIAC CATHETERIZATION  12/28/2010   Mod. pulmonary hypertension, normal coronary arteries  . DOPPLER ECHOCARDIOGRAPHY  10/08/2011   EF=>55%,mild asymmetric LVH, mod. TR, mod. PH, mild to mod LA dilatation  . KNEE ARTHROSCOPY Left   . KNEE SURGERY    . Nuclear Stress Test  05/20/2006   No ischemia  . PARTIAL HYSTERECTOMY    . PLANTAR FASCIA SURGERY    . TONSILLECTOMY      Review of systems negative except as noted in HPI / PMHx or noted below:  Review of Systems  Constitutional: Negative.   HENT: Negative.   Eyes: Negative.   Respiratory: Negative.   Cardiovascular: Negative.   Gastrointestinal: Negative.   Genitourinary: Negative.   Musculoskeletal: Negative.   Skin: Negative.   Neurological: Negative.   Endo/Heme/Allergies: Negative.   Psychiatric/Behavioral: Negative.      Objective:   Vitals:   05/26/19 1142  BP: (!) 141/80  Pulse: 96  Resp: 18  Temp: (!) 97 F (36.1 C)  SpO2: 99%          Physical Exam Constitutional:      Appearance: She is not diaphoretic.     Comments: Very slow deliberate stiff movements transferring from chair to examination table  HENT:     Head: Normocephalic.     Right Ear: Tympanic membrane, ear canal and external ear normal.     Left Ear: Tympanic membrane, ear canal and external ear normal.     Nose: Nose normal. No mucosal edema or rhinorrhea.     Mouth/Throat:     Pharynx: Uvula midline. No oropharyngeal exudate.  Eyes:     Conjunctiva/sclera: Conjunctivae normal.  Neck:     Thyroid: No thyromegaly.      Trachea: Trachea normal.  No tracheal tenderness or tracheal deviation.  Cardiovascular:     Rate and Rhythm: Normal rate and regular rhythm.     Heart sounds: S1 normal and S2 normal. Murmur (Systolic) present.  Pulmonary:     Effort: No respiratory distress.     Breath sounds: Normal breath sounds. No stridor. No wheezing or rales.  Lymphadenopathy:     Head:     Right side of head: No tonsillar adenopathy.     Left side of head: No tonsillar adenopathy.     Cervical: No cervical adenopathy.  Skin:    Findings: No erythema or rash.     Nails: There is no clubbing.   Neurological:     Mental Status: She is alert.     Diagnostics:    Spirometry was performed and demonstrated an FEV1 of 1.65 at 88 % of predicted.  The patient had an Asthma Control Test with the following results: ACT Total Score: 18.    Assessment and Plan:   1. Asthma, severe persistent, well-controlled   2. Other allergic rhinitis   3. LPRD (laryngopharyngeal reflux disease)     1. Continue Advair 230 - 2 inhalations twice a day  2. Continue Spiriva 2.5 respimat - two inhalations one time per day.  3. Continue Montelukast 10mg  - one tablet one time per day.  4. Continue Xolair and Epi-Pen  5. Continue pantoprazole 40mg  in the AM and Famotidine 40 mg in the PM  6. If needed:   A. Nasal saline several times per day  B. Mucinex DM two times per day  C. nasal azelastine two sprays each nostril 1-2 times per day  D. Ipratropium 0.06% -1-2 sprays each nostril 1-2 times per day to dry nose  E. Proventil HFA or albuterol nebulization  7. Add Flovent 220 - 2 inhalations 2 times a day during "FLARE UP"  8. Return to clinic in 6 months or earlier if there is a problem.  9. Obtain COVID vaccine when available.  10.  Discuss with orthopedic surgeon or neurosurgeon options for back and knee.  11. Tylenol - III use. Aim least amount required.   Overall [name]'s airway status appears to be stable as is  her reflux.  Her big problem at this point in time is really her back and knee issue as this has dramatically impairing her quality of life.  I have encouraged her to discuss with her orthopedic surgeon or neurosurgeon about a possible option for dealing with these issues at some point in the future.  She certainly does not want to be in mobile as she enters into the last few decades of her life.  As well, I did have a talk with her today about Tylenol 3 use aiming for the least amount required to control her pain issue.  I suspect that Tylenol 3 is probably contributing to some of the fatigue that she suffers on a chronic basis.  I will see her back in this clinic in 6 months or earlier if there is a problem.  Allena Katz, MD Allergy / [facility]ology [place]

## 2019-05-26 NOTE — Patient Instructions (Addendum)
  1. Continue Advair 230 - 2 inhalations twice a day  2. Continue Spiriva 2.5 respimat - two inhalations one time per day.  3. Continue Montelukast 10mg  - one tablet one time per day.  4. Continue Xolair and Epi-Pen  5. Continue pantoprazole 40mg  in the AM and Famotidine 40 mg in the PM  6. If needed:   A. Nasal saline several times per day  B. Mucinex DM two times per day  C. nasal azelastine two sprays each nostril 1-2 times per day  D. Ipratropium 0.06% -1-2 sprays each nostril 1-2 times per day to dry nose  E. Proventil HFA or albuterol nebulization  7. Add Flovent 220 - 2 inhalations 2 times a day during "FLARE UP"  8. Return to clinic in 6 months or earlier if there is a problem.  9. Obtain COVID vaccine when available.  10.  Discuss with orthopedic surgeon or neurosurgeon options for back and knee.  11. Tylenol - III use. Aim least amount required.

## 2019-05-27 ENCOUNTER — Encounter: Payer: Self-pay | Admitting: Allergy and Immunology

## 2019-05-27 NOTE — Telephone Encounter (Signed)
Patient called.  Patient wanted to let [name] know she has a doctor's appointment tomorrow and she'll come by tomorrow and pick up the vitamins.  Patient can be reached at [phone].

## 2019-05-27 NOTE — Telephone Encounter (Signed)
Noted  

## 2019-06-01 ENCOUNTER — Telehealth: Payer: Self-pay | Admitting: Internal Medicine

## 2019-06-01 NOTE — Telephone Encounter (Signed)
Patient has a question about taking Biotin.  Please return a call to [phone]

## 2019-06-02 NOTE — Telephone Encounter (Signed)
No problem - she can take it, but remember to take the multivitamin at least 4 hours after the thyroid medication.  However, whenever she comes back for labs, she will need to be off the multivitamin for at least 2 days, since the biotin can interfere with our thyroid assays.

## 2019-06-02 NOTE — Telephone Encounter (Signed)
Left message for patient to return our call at [phone].  

## 2019-06-02 NOTE — Telephone Encounter (Signed)
Spoke to patient:  Patient states she got a multivitamin that contains biotin and does not recall if Dr. Cruzita Lederer told her not to take any Biotin due to her thyroid med?

## 2019-06-03 NOTE — Telephone Encounter (Signed)
Notified patient of message from Dr. Gherghe, patient expressed understanding and agreement. No further questions.  

## 2019-06-05 ENCOUNTER — Ambulatory Visit: Payer: Self-pay

## 2019-06-08 DIAGNOSIS — J4550 Severe persistent asthma, uncomplicated: Secondary | ICD-10-CM

## 2019-06-09 ENCOUNTER — Ambulatory Visit (INDEPENDENT_AMBULATORY_CARE_PROVIDER_SITE_OTHER): Payer: Medicare Other

## 2019-06-09 ENCOUNTER — Other Ambulatory Visit: Payer: Self-pay

## 2019-06-09 DIAGNOSIS — J4550 Severe persistent asthma, uncomplicated: Secondary | ICD-10-CM

## 2019-06-10 DIAGNOSIS — M5136 Other intervertebral disc degeneration, lumbar region: Secondary | ICD-10-CM | POA: Diagnosis not present

## 2019-06-10 DIAGNOSIS — M1711 Unilateral primary osteoarthritis, right knee: Secondary | ICD-10-CM | POA: Diagnosis not present

## 2019-06-14 ENCOUNTER — Other Ambulatory Visit: Payer: Self-pay | Admitting: Allergy and Immunology

## 2019-06-16 DIAGNOSIS — H04123 Dry eye syndrome of bilateral lacrimal glands: Secondary | ICD-10-CM | POA: Diagnosis not present

## 2019-06-16 DIAGNOSIS — H1045 Other chronic allergic conjunctivitis: Secondary | ICD-10-CM | POA: Diagnosis not present

## 2019-06-25 DIAGNOSIS — K5901 Slow transit constipation: Secondary | ICD-10-CM | POA: Diagnosis not present

## 2019-06-30 ENCOUNTER — Telehealth: Payer: Self-pay | Admitting: Cardiovascular Disease

## 2019-06-30 ENCOUNTER — Ambulatory Visit (INDEPENDENT_AMBULATORY_CARE_PROVIDER_SITE_OTHER): Payer: Medicare Other | Admitting: Podiatry

## 2019-06-30 ENCOUNTER — Other Ambulatory Visit: Payer: Self-pay

## 2019-06-30 DIAGNOSIS — M79676 Pain in unspecified toe(s): Secondary | ICD-10-CM

## 2019-06-30 DIAGNOSIS — B351 Tinea unguium: Secondary | ICD-10-CM

## 2019-06-30 DIAGNOSIS — E1142 Type 2 diabetes mellitus with diabetic polyneuropathy: Secondary | ICD-10-CM | POA: Diagnosis not present

## 2019-06-30 DIAGNOSIS — Q828 Other specified congenital malformations of skin: Secondary | ICD-10-CM | POA: Diagnosis not present

## 2019-06-30 NOTE — Telephone Encounter (Signed)
Patient wanted to discuss some of her medications with Dr. Lurline Del nurse. Pt did not give names of specific medications

## 2019-06-30 NOTE — Telephone Encounter (Signed)
Returned the call to the patient. She was calling to make sure she could take a Tumeric supplement 400 mg once daily.

## 2019-06-30 NOTE — Telephone Encounter (Signed)
Should not be a problem

## 2019-07-01 NOTE — Telephone Encounter (Signed)
Patient has verbalized her understanding.

## 2019-07-01 NOTE — Progress Notes (Signed)
She presents today chief complaint of painfully elongated toenails.  Objective: Toenails are long thick yellow dystrophic-like mycotic no open lesions or wounds are noted.  Assessment: Pain in limb secondary to onychomycosis.  Diabetic peripheral neuropathy.  Plan: Debridement of toenails 1 through 5 bilaterally.

## 2019-07-03 ENCOUNTER — Ambulatory Visit (INDEPENDENT_AMBULATORY_CARE_PROVIDER_SITE_OTHER): Payer: Medicare Other | Admitting: Family Medicine

## 2019-07-03 ENCOUNTER — Encounter: Payer: Self-pay | Admitting: Family Medicine

## 2019-07-03 ENCOUNTER — Other Ambulatory Visit: Payer: Self-pay

## 2019-07-03 VITALS — Temp 96.0°F | Ht 64.5 in | Wt 220.0 lb

## 2019-07-03 DIAGNOSIS — J4541 Moderate persistent asthma with (acute) exacerbation: Secondary | ICD-10-CM | POA: Diagnosis not present

## 2019-07-03 DIAGNOSIS — J029 Acute pharyngitis, unspecified: Secondary | ICD-10-CM

## 2019-07-03 NOTE — Assessment & Plan Note (Addendum)
Likely due to allergies or irritation from CPAP. Concerning for viral URI.. recommended COVID testing and pt will consider.. given info on how to schedule.  Start nasal steroid spray or Astelin , nasal saline,  Humifier, mucinex.

## 2019-07-03 NOTE — Progress Notes (Signed)
VIRTUAL VISIT Due to national recommendations of social distancing due to [place] 19, a virtual visit is felt to be most appropriate for this patient at this time.   I connected with the patient on 07/03/19 at 10:40 AM EST by virtual telehealth platform and verified that I am speaking with the correct person using two identifiers.   Interactive audio and video telecommunications were attempted between this provider and patient, however failed, due to patient having technical difficulties OR patient did not have access to video capability.  We continued and completed visit with audio only.   Virtual Visit via Telephone Note  I connected with patient on 07/03/19  at 11:23 AM  by telephone and verified that I am speaking with the correct person using two identifiers.  Interactive audio and video telecommunications were attempted between this provider and patient, however failed, due to patient having technical difficulties OR patient did not have access to video capability.  We continued and completed visit with audio only.   I discussed the limitations, risks, security and privacy concerns of performing an evaluation and management service by  virtual telehealth platform and the availability of in person appointments. I also discussed with the patient that there may be a patient responsible charge related to this service. The patient expressed understanding and agreed to proceed.  Patient location: Home Provider Location: [place] Participants: [name] and [name]   Chief Complaint  Patient presents with  . Sore Throat    History of Present Illness:  73 year old female with high risk for complications from 0000000 presents with new onset ST in last 3-4 days.   She  has had increased congestion and  when clears throat she  Saw small amount of blood. Post nasal drip.  no sneeze, no ear pain, no sinus pain/pressure  No cough, no SOB.  Slight increase in wheeze.  No  fever, some fatigue.  No diarrhea, no taste or smell.   She has a history of  Allergic rhinitis, asthma moderate persistent.  She is taking her allergy meds and asthma controllers. Advair, flovent, spiriva, Singulair, albuterol/ nebs ( has not used)),  Nasonex.   No white plaque in mouth.   CPAP .Marland Kitchen using this.  COVID 19 screen No recent travel or known exposure to [place]. She wears a mask faithfully.  The importance of social distancing was discussed today.   ROS    Past Medical History:  Diagnosis Date  . Allergic rhinitis   . Allergy   . Arthritis   . Asthma   . Chronic headache   . Diabetes mellitus   . DVT (deep venous thrombosis) ([place])   . Dyspnea   . Heart murmur   . Hypertension   . Pulmonary embolism ([place])   . Pulmonary hypertension ([place])   . Seizures ([place])     reports that she has never smoked. She has never used smokeless tobacco. She reports current alcohol use. She reports that she does not use drugs.   Current Outpatient Medications:  .  acetaminophen-codeine (TYLENOL #3) 300-30 MG tablet, TAKE 1 TABLET BY MOUTH EVERY 12 HOURS AS NEEDED FOR UP TO 7 DAYS FOR MODERATE PAIN, Disp: 60 tablet, Rfl: 0 .  albuterol (VENTOLIN HFA) 108 (90 Base) MCG/ACT inhaler, INHALE 2 PUFFS BY MOUTH INTO THE LUNGS EVERY 6 HOURS AS NEEDED FOR WHEEZING OR SHORTNESS OF BREATH, Disp: 54 g, Rfl: 0 .  azelastine (ASTELIN) 0.1 % nasal spray, USE 1-2 SPRAYS IN  EACH NOSTRIL TWICE DAILY AS NEEDED, Disp: 30 mL, Rfl: 5 .  clobetasol cream (TEMOVATE) [id number] %, Apply 1 application topically 2 (two) times daily., Disp: , Rfl:  .  cyclobenzaprine (FLEXERIL) 10 MG tablet, Take 1 tablet (10 mg total) by mouth at bedtime as needed for muscle spasms., Disp: 30 tablet, Rfl: 1 .  diltiazem (CARTIA XT) 120 MG 24 hr capsule, Take 1 capsule (120 mg total) by mouth daily. KEEP OV., Disp: 30 capsule, Rfl: 2 .  DULoxetine (CYMBALTA) 30 MG capsule, TAKE 1 CAPSULE(30 MG) BY MOUTH DAILY, Disp: 30 capsule, Rfl: 5 .   Empagliflozin-linaGLIPtin (GLYXAMBI) 10-5 MG TABS, Take 1 tablet by mouth daily., Disp: 30 tablet, Rfl: 2 .  EPINEPHrine 0.3 mg/0.3 mL IJ SOAJ injection, USE AS DIRECTED FOR LIFE THREATENING ALLERGIC REACTIONS, Disp: 2 each, Rfl: 2 .  famotidine (PEPCID) 20 MG tablet, Take 1 tablet (20 mg total) by mouth 2 (two) times daily., Disp: 60 tablet, Rfl: 0 .  fluconazole (DIFLUCAN) 150 MG tablet, Take 1 tab po today, repeat in 7 days if needed, Disp: 2 tablet, Rfl: 0 .  fluticasone (FLOVENT HFA) 220 MCG/ACT inhaler, Inhale two puffs twice daily during flare-up as needed.  Rinse, gargle, and spit after use., Disp: 12 g, Rfl: 5 .  fluticasone-salmeterol (ADVAIR HFA) 230-21 MCG/ACT inhaler, Inhale two puffs twice daily to prevent cough or wheeze. Rinse mouth after use., Disp: 12 g, Rfl: 3 .  furosemide (LASIX) 40 MG tablet, TAKE 1 TABLET(40 MG) BY MOUTH DAILY, Disp: 30 tablet, Rfl: 3 .  gabapentin (NEURONTIN) 600 MG tablet, Take one at breakfast, one at lunch, 2 at bedtime, Disp: 360 tablet, Rfl: 1 .  hydrOXYzine (ATARAX/VISTARIL) 10 MG tablet, TK 1 TO 2 TS PO HS, Disp: , Rfl: 1 .  ipratropium (ATROVENT) 0.06 % nasal spray, Place 1-2 sprays each nostril 1-2 times per day, Disp: 15 mL, Rfl: 5 .  irbesartan (AVAPRO) 150 MG tablet, TAKE 1 TABLET(150 MG) BY MOUTH DAILY, Disp: 30 tablet, Rfl: 9 .  Lactase (DAIRY-RELIEF PO), Take 1 capsule by mouth daily as needed (stomach upset). , Disp: , Rfl:  .  levalbuterol (XOPENEX) 1.25 MG/3ML nebulizer solution, Take 1.25 mg by nebulization every 6 (six) hours as needed for wheezing or shortness of breath., Disp: 1080 mL, Rfl: 1 .  Magnesium 250 MG TABS, Take 1 tablet by mouth daily., Disp: , Rfl:  .  meclizine (ANTIVERT) 12.5 MG tablet, Take 1 tablet (12.5 mg total) by mouth 3 (three) times daily as needed for dizziness., Disp: 15 tablet, Rfl: 0 .  methimazole (TAPAZOLE) 5 MG tablet, Take 2.5 mg daily by mouth, with meals, Disp: 30 tablet, Rfl: 5 .  montelukast (SINGULAIR)  10 MG tablet, TAKE 1 TABLET BY MOUTH EVERY DAY, Disp: 90 tablet, Rfl: 1 .  ONETOUCH DELICA LANCETS 99991111 MISC, as needed (blood check). , Disp: , Rfl:  .  ONETOUCH VERIO test strip, 1 each by Other route as needed (blood monitoring). , Disp: , Rfl:  .  oxybutynin (DITROPAN-XL) 10 MG 24 hr tablet, oxybutynin chloride ER 10 mg tablet,extended release 24 hr, Disp: , Rfl:  .  pantoprazole (PROTONIX) 40 MG tablet, Take 1 tablet (40 mg total) by mouth daily., Disp: 90 tablet, Rfl: 1 .  PATADAY 0.2 % SOLN, INT 1 GTT IN OU QD PRN, Disp: , Rfl: 4 .  polyethylene glycol (MIRALAX / GLYCOLAX) packet, Take 17 g by mouth daily., Disp: , Rfl:  .  Potassium 99 MG TABS,  Take 99 mg by mouth daily., Disp: , Rfl:  .  predniSONE (DELTASONE) 5 MG tablet, prednisone 5 mg tablet  1 tab tid x 2 days, 1 tab bid x 5 days, 1 tab qd till finished, Disp: , Rfl:  .  Probiotic Product (ALIGN) 4 MG CAPS, Take 1 capsule by mouth daily., Disp: , Rfl:  .  rivaroxaban (XARELTO) 20 MG TABS tablet, Take 1 tablet (20 mg total) by mouth daily with supper., Disp: 90 tablet, Rfl: 1 .  SUMAtriptan (IMITREX) 100 MG tablet, Take 100 mg x 1 may, repeat in 2 hour for migraine., Disp: 10 tablet, Rfl: 0 .  Tiotropium Bromide Monohydrate (SPIRIVA RESPIMAT) 1.25 MCG/ACT AERS, Inhale 2 puffs into the lungs daily., Disp: 4 g, Rfl: 5 .  tolterodine (DETROL LA) 4 MG 24 hr capsule, Take 4 mg by mouth daily., Disp: , Rfl:  .  triamcinolone cream (KENALOG) 0.1 %, Apply 1 application topically 2 (two) times daily., Disp: 30 g, Rfl: 0 .  TRULANCE 3 MG TABS, Take 1 tablet by mouth daily. , Disp: , Rfl:   Current Facility-Administered Medications:  .  omalizumab Arvid Right) injection 300 mg, 300 mg, Subcutaneous, Q28 days, Kozlow, Donnamarie Poag, MD, 300 mg at 06/09/19 1054   Observations/Objective: Temperature (!) 96 F (35.6 C), temperature source Oral, height 5' 4.5" (1.638 m), weight 220 lb (99.8 kg).  Physical Exam  Physical Exam Constitutional:      General:  The patient is not in acute distress. Pulmonary:     Effort: Pulmonary effort is normal. No respiratory distress.  Neurological:     Mental Status: The patient is alert and oriented to person, place, and time.  Psychiatric:        Mood and Affect: Mood normal.        Behavior: Behavior normal.   Assessment and Plan   I discussed the assessment and treatment plan with the patient. The patient was provided an opportunity to ask questions and all were answered. The patient agreed with the plan and demonstrated an understanding of the instructions.   The patient was advised to call back or seek an in-person evaluation if the symptoms worsen or if the condition fails to improve as anticipated.   Sore throat  Likely due to allergies or irritation from CPAP. Concerning for viral URI.. recommended COVID testing and pt will consider.. given info on how to schedule.  Start nasal steroid spray or Astelin , nasal saline,  Humifier, mucinex.  Moderate persistent asthma Mild.. use nebulizer as needed. If worsening consider prednisone.    [name], MD

## 2019-07-03 NOTE — Assessment & Plan Note (Signed)
Mild.. use nebulizer as needed. If worsening consider prednisone.

## 2019-07-06 ENCOUNTER — Other Ambulatory Visit: Payer: Self-pay

## 2019-07-06 ENCOUNTER — Ambulatory Visit: Payer: Medicare Other | Attending: Internal Medicine

## 2019-07-06 DIAGNOSIS — Z20822 Contact with and (suspected) exposure to covid-19: Secondary | ICD-10-CM

## 2019-07-06 MED ORDER — IRBESARTAN 150 MG PO TABS: ORAL_TABLET | ORAL | 9 refills | Status: DC

## 2019-07-07 ENCOUNTER — Ambulatory Visit: Payer: Self-pay

## 2019-07-07 LAB — NOVEL CORONAVIRUS, NAA: SARS-CoV-2, NAA: NOT DETECTED

## 2019-07-08 DIAGNOSIS — J4550 Severe persistent asthma, uncomplicated: Secondary | ICD-10-CM | POA: Diagnosis not present

## 2019-07-12 ENCOUNTER — Telehealth: Payer: Self-pay | Admitting: Allergy & Immunology

## 2019-07-12 MED ORDER — PREDNISONE 10 MG PO TABS: ORAL_TABLET | ORAL | 0 refills | Status: DC

## 2019-07-12 NOTE — Telephone Encounter (Signed)
Patient called reporting hoarseness and wheezing for several days. She has been using hre controller medication as well as her albuterol nebulizer, Mucinex, and salt water gargles. She is afebrile and reporting to contacts with COVIC19 patients. Therefore, I will send in prednisone for a few days. We will call to check on her tomorrow.   Patient in agreement with the plan.   Salvatore Marvel, MD [facility]

## 2019-07-13 ENCOUNTER — Ambulatory Visit (INDEPENDENT_AMBULATORY_CARE_PROVIDER_SITE_OTHER): Payer: Medicare Other

## 2019-07-13 ENCOUNTER — Other Ambulatory Visit: Payer: Self-pay

## 2019-07-13 DIAGNOSIS — J4550 Severe persistent asthma, uncomplicated: Secondary | ICD-10-CM | POA: Diagnosis not present

## 2019-07-13 NOTE — Telephone Encounter (Signed)
Called to follow up with patient and she states that she had just spoken with a nurse from our office but she didn't know their name. She informed the nurse that she spoke to earlier that she would like them to send Dr. Neldon Mc a message informing him of her symptoms and to get his opinion. She also requested that he call her back today.

## 2019-07-13 NOTE — Telephone Encounter (Signed)
Patient called office and still wheezing and has productive cough at times.   No fever. Patient is taking Prednisone that was prescribed by Dr. Ernst Bowler.  Patient is using her nebulizer 3-4 times daily.  Patient is also taking Mucinex, Advair, Flovent and Spiriva. Offered patient office visit today with Dr.Kim, but patient wanted to speak with Dr. Neldon Mc rather than different doctor see her.  [phone]

## 2019-07-13 NOTE — Telephone Encounter (Signed)
Patient called the office and stated she did speak with Dr. Neldon Mc and was told okay to get her Xolair injection that she missed last week.  Patient will call when she is here and come and in the back door for injection.

## 2019-07-13 NOTE — Telephone Encounter (Signed)
Have discussed this issue with the patient.  She just started prednisone yesterday.  We will see what happens over the course of the next few days.  This apparently has been an issue that has been in existence for approximately 10 days and does not appear to be progressive.  She was Covid tested 7 days ago with a negative test.

## 2019-07-20 ENCOUNTER — Other Ambulatory Visit: Payer: Self-pay | Admitting: Family Medicine

## 2019-07-20 NOTE — Telephone Encounter (Signed)
Pt called wanting donna to call her  Regarding ongoing back pain Pt only wanted to talk to [place]

## 2019-07-21 ENCOUNTER — Encounter: Payer: Self-pay | Admitting: Allergy and Immunology

## 2019-07-21 ENCOUNTER — Other Ambulatory Visit: Payer: Self-pay

## 2019-07-21 ENCOUNTER — Ambulatory Visit (INDEPENDENT_AMBULATORY_CARE_PROVIDER_SITE_OTHER): Payer: Medicare Other | Admitting: Allergy and Immunology

## 2019-07-21 VITALS — BP 124/82 | HR 84 | Temp 98.6°F | Resp 18 | Ht 64.4 in | Wt 220.0 lb

## 2019-07-21 DIAGNOSIS — B370 Candidal stomatitis: Secondary | ICD-10-CM | POA: Diagnosis not present

## 2019-07-21 DIAGNOSIS — J4550 Severe persistent asthma, uncomplicated: Secondary | ICD-10-CM | POA: Diagnosis not present

## 2019-07-21 DIAGNOSIS — J3089 Other allergic rhinitis: Secondary | ICD-10-CM | POA: Diagnosis not present

## 2019-07-21 DIAGNOSIS — K219 Gastro-esophageal reflux disease without esophagitis: Secondary | ICD-10-CM

## 2019-07-21 MED ORDER — NYSTATIN 100000 UNIT/ML MT SUSP: OROMUCOSAL | 5 refills | Status: DC

## 2019-07-21 NOTE — Telephone Encounter (Signed)
Given this is a controlled substance.. we need to get her on  q 3 month chronic pain visit schedule.Marland Kitchen appt due now, needs UDS and controlled substance agreement I believe.  Can do virtually.. will refill then.

## 2019-07-21 NOTE — Patient Instructions (Addendum)
  1. Continue Advair 230 - 2 inhalations twice a day  2. START nystatin 5 mL swish and swallow after Advair 2 times a day  2. Continue Spiriva 2.5 respimat - two inhalations one time per day.  3. Continue Montelukast 10mg  - one tablet one time per day.  4. Continue Xolair and Epi-Pen  5. Continue pantoprazole 40mg  in the AM and Famotidine 40 mg in the PM  6. If needed:   A. Nasal saline several times per day  B. Mucinex DM two times per day  C. nasal azelastine two sprays each nostril 1-2 times per day  D. Ipratropium 0.06% -1-2 sprays each nostril 1-2 times per day to dry nose  E. Proventil HFA or albuterol nebulization  7. Add Flovent 220 - 2 inhalations 2 times a day during "FLARE UP"  8. Return to clinic in 6 months or earlier if there is a problem.  9. Obtain COVID vaccine when available.

## 2019-07-21 NOTE — Telephone Encounter (Signed)
Spoke with [name].  She states she was just needing a refill on her Tylenol #3.  Last office visit 07/03/2019 for [facility] Ast[facility].  Last refilled 04/28/2019 for #60 with no refills.  CPE scheduled for 04/19/2020.

## 2019-07-21 NOTE — Progress Notes (Signed)
[facility] [place] [facility] [place]   Follow-up Note  Referring Provider: [name], MD Primary Provider: [name], MD Date of Office Visit: 07/21/2019  Subjective:   [name] (DOB: [birth year]) is a 74 y.o. female who returns to the [facility] on 07/21/2019 in re-evaluation of the following:  HPI: Schuylar returns to this clinic in reevaluation of asthma and allergic rhinitis and LPR and a history of pulmonary hypertension, diastolic dysfunction, and sleep apnea.  I last saw her in this clinic on 26 May 2019.  With that last visit her airway was doing relatively well and her reflux was under good control and her sleep apnea was well controlled with CPAP in the context of receiving systemic steroid injections for her back issue.  She has developed a problem with recurrent sore throat over the course of the past month or so.  She was apparently treated with systemic steroids recently assuming that she was having airway inflammation and this really did not help her sore throat very much.  She has intermittent sore throat on and off for the past several weeks without any fever or other significant respiratory tract symptoms.  Overall her airway issue is going well while using a very large collection of anti-inflammatory agents for airway including the use of omalizumab injections and her reflux is going well while consistently using a proton pump inhibitor and H2 receptor blocker.  Allergies as of 07/21/2019      Reactions   Latex Hives   Penicillins Swelling   Has patient had a PCN reaction causing immediate rash, facial/tongue/throat swelling, SOB or lightheadedness with hypotension patient had a PCN reaction causing severe rash involving mucus membranes or skin necrosis: [id number] Has patient had a PCN reaction that required hospitalization/No Has patient had a PCN reaction occurring within the last 10 years: No If all of the  above answers are "NO", then may proceed with Cephalosporin use.   Lyrica [pregabalin] Other (See Comments)   Blurred vision.   Phenytoin Sodium Extended Swelling   Swelling    Vicodin [hydrocodone-acetaminophen] Nausea And Vomiting      Medication List       Accurate as of July 21, 2019 12:26 PM. If you have any questions, ask your nurse or doctor.        acetaminophen-codeine 300-30 MG tablet Commonly known as: TYLENOL #3 TAKE 1 TABLET BY MOUTH EVERY 12 HOURS AS NEEDED FOR UP TO 7 DAYS FOR MODERATE PAIN   Advair HFA 230-21 MCG/ACT inhaler Generic drug: fluticasone-salmeterol Inhale two puffs twice daily to prevent cough or wheeze. Rinse mouth after use.   albuterol 108 (90 Base) MCG/ACT inhaler Commonly known as: VENTOLIN HFA INHALE 2 PUFFS BY MOUTH INTO THE LUNGS EVERY 6 HOURS AS NEEDED FOR WHEEZING OR SHORTNESS OF BREATH   Align 4 MG Caps Take 1 capsule by mouth daily.   azelastine 0.1 % nasal spray Commonly known as: Astelin USE 1-2 SPRAYS IN EACH NOSTRIL TWICE DAILY AS NEEDED   clobetasol cream 0.05 % Commonly known as: TEMOVATE Apply 1 application topically 2 (two) times daily.   cyclobenzaprine 10 MG tablet Commonly known as: FLEXERIL Take 1 tablet (10 mg total) by mouth at bedtime as needed for muscle spasms.   DAIRY-RELIEF PO Take 1 capsule by mouth daily as needed (stomach upset).   diltiazem 120 MG 24 hr capsule Commonly known as: Cartia XT Take 1 capsule (120 mg total) by mouth  daily. KEEP OV.   DULoxetine 30 MG capsule Commonly known as: CYMBALTA TAKE 1 CAPSULE(30 MG) BY MOUTH DAILY   EPINEPHrine 0.3 mg/0.3 mL Soaj injection Commonly known as: EPI-PEN USE AS DIRECTED FOR LIFE THREATENING ALLERGIC REACTIONS   famotidine 20 MG tablet Commonly known as: PEPCID Take 1 tablet (20 mg total) by mouth 2 (two) times daily.   Flovent HFA 220 MCG/ACT inhaler Generic drug: fluticasone Inhale two puffs twice daily during flare-up as needed.  Rinse,  gargle, and spit after use.   fluconazole 150 MG tablet Commonly known as: DIFLUCAN Take 1 tab po today, repeat in 7 days if needed   furosemide 40 MG tablet Commonly known as: LASIX TAKE 1 TABLET(40 MG) BY MOUTH DAILY   gabapentin 600 MG tablet Commonly known as: Neurontin Take one at breakfast, one at lunch, 2 at bedtime   Glyxambi 10-5 MG Tabs Generic drug: Empagliflozin-linaGLIPtin Take 1 tablet by mouth daily.   hydrOXYzine 10 MG tablet Commonly known as: ATARAX/VISTARIL TK 1 TO 2 TS PO HS   ipratropium 0.06 % nasal spray Commonly known as: ATROVENT Place 1-2 sprays each nostril 1-2 times per day   irbesartan 150 MG tablet Commonly known as: AVAPRO TAKE 1 TABLET(150 MG) BY MOUTH DAILY   levalbuterol 1.25 MG/3ML nebulizer solution Commonly known as: XOPENEX Take 1.25 mg by nebulization every 6 (six) hours as needed for wheezing or shortness of breath.   Magnesium 250 MG Tabs Take 1 tablet by mouth daily.   meclizine 12.5 MG tablet Commonly known as: ANTIVERT Take 1 tablet (12.5 mg total) by mouth 3 (three) times daily as needed for dizziness.   methimazole 5 MG tablet Commonly known as: TAPAZOLE Take 2.5 mg daily by mouth, with meals   montelukast 10 MG tablet Commonly known as: SINGULAIR TAKE 1 TABLET BY MOUTH EVERY DAY   OneTouch Delica Lancets 99991111 Misc as needed (blood check).   OneTouch Verio test strip Generic drug: glucose blood 1 each by Other route as needed (blood monitoring).   oxybutynin 10 MG 24 hr tablet Commonly known as: DITROPAN-XL oxybutynin chloride ER 10 mg tablet,extended release 24 hr   pantoprazole 40 MG tablet Commonly known as: PROTONIX Take 1 tablet (40 mg total) by mouth daily.   Pataday 0.2 % Soln Generic drug: Olopatadine HCl INT 1 GTT IN OU QD PRN   polyethylene glycol 17 g packet Commonly known as: MIRALAX / GLYCOLAX Take 17 g by mouth daily.   Potassium 99 MG Tabs Take 99 mg by mouth daily.   predniSONE 5 MG  tablet Commonly known as: DELTASONE prednisone 5 mg tablet  1 tab tid x 2 days, 1 tab bid x 5 days, 1 tab qd till finished   predniSONE 10 MG tablet Commonly known as: DELTASONE Take two tablets (20mg ) twice daily for three days, then one tablet (10mg ) twice daily for three days, then STOP.   rivaroxaban 20 MG Tabs tablet Commonly known as: Xarelto Take 1 tablet (20 mg total) by mouth daily with supper.   Spiriva Respimat 1.25 MCG/ACT Aers Generic drug: Tiotropium Bromide Monohydrate Inhale 2 puffs into the lungs daily.   SUMAtriptan 100 MG tablet Commonly known as: IMITREX Take 100 mg x 1 may, repeat in 2 hour for migraine.   tolterodine 4 MG 24 hr capsule Commonly known as: DETROL LA Take 4 mg by mouth daily.   triamcinolone cream 0.1 % Commonly known as: KENALOG Apply 1 application topically 2 (two) times daily.  Trulance 3 MG Tabs Generic drug: Plecanatide Take 1 tablet by mouth daily.       Past Medical History:  Diagnosis Date  . Allergic rhinitis   . Allergy   . Arthritis   . Asthma   . Chronic headache   . Diabetes mellitus   . DVT (deep venous thrombosis) ([place])   . Dyspnea   . Heart murmur   . Hypertension   . Pulmonary embolism ([place])   . Pulmonary hypertension ([place])   . Seizures ([place])     Past Surgical History:  Procedure Laterality Date  . ABDOMINAL HYSTERECTOMY     partial, has ovaries  . CARDIAC CATHETERIZATION  12/28/2010   Mod. pulmonary hypertension, normal coronary arteries  . DOPPLER ECHOCARDIOGRAPHY  10/08/2011   EF=>55%,mild asymmetric LVH, mod. TR, mod. PH, mild to mod LA dilatation  . KNEE ARTHROSCOPY Left   . KNEE SURGERY    . Nuclear Stress Test  05/20/2006   No ischemia  . PARTIAL HYSTERECTOMY    . PLANTAR FASCIA SURGERY    . TONSILLECTOMY      Review of systems negative except as noted in HPI / PMHx or noted below:  Review of Systems  Constitutional: Negative.   HENT: Negative.   Eyes: Negative.   Respiratory:  Negative.   Cardiovascular: Negative.   Gastrointestinal: Negative.   Genitourinary: Negative.   Musculoskeletal: Negative.   Skin: Negative.   Neurological: Negative.   Endo/Heme/Allergies: Negative.   Psychiatric/Behavioral: Negative.      Objective:   Vitals:   07/21/19 1111  BP: 124/82  Pulse: 84  Resp: 18  Temp: 98.6 F (37 C)  SpO2: 94%   Height: 5' 4.4" (163.6 cm)  Weight: 220 lb (99.8 kg)   Physical Exam Constitutional:      Appearance: She is not diaphoretic.  HENT:     Head: Normocephalic.     Right Ear: Tympanic membrane, ear canal and external ear normal.     Left Ear: Tympanic membrane, ear canal and external ear normal.     Nose: Nose normal. No mucosal edema or rhinorrhea.     Mouth/Throat:     Pharynx: Uvula midline. Oropharyngeal exudate (Thrush) present.  Eyes:     Conjunctiva/sclera: Conjunctivae normal.  Neck:     Thyroid: No thyromegaly.     Trachea: Trachea normal. No tracheal tenderness or tracheal deviation.  Cardiovascular:     Rate and Rhythm: Normal rate and regular rhythm.     Heart sounds: S1 normal and S2 normal. Murmur (Systolic) present.  Pulmonary:     Effort: No respiratory distress.     Breath sounds: Normal breath sounds. No stridor. No wheezing or rales.  Lymphadenopathy:     Head:     Right side of head: No tonsillar adenopathy.     Left side of head: No tonsillar adenopathy.     Cervical: No cervical adenopathy.  Skin:    Findings: No erythema or rash.     Nails: There is no clubbing.  Neurological:     Mental Status: She is alert.     Diagnostics:    Spirometry was performed and demonstrated an FEV1 of 1.44 at 77 % of predicted.  Assessment and Plan:   1. Asthma, severe persistent, well-controlled   2. Other allergic rhinitis   3. LPRD (laryngopharyngeal reflux disease)   4. Thrush     1. Continue Advair 230 - 2 inhalations twice a day  2. START nystatin 5 mL swish  and swallow after Advair 2 times a  day  2. Continue Spiriva 2.5 respimat - two inhalations one time per day.  3. Continue Montelukast 10mg  - one tablet one time per day.  4. Continue Xolair and Epi-Pen  5. Continue pantoprazole 40mg  in the AM and Famotidine 40 mg in the PM  6. If needed:   A. Nasal saline several times per day  B. Mucinex DM two times per day  C. nasal azelastine two sprays each nostril 1-2 times per day  D. Ipratropium 0.06% -1-2 sprays each nostril 1-2 times per day to dry nose  E. Proventil HFA or albuterol nebulization  7. Add Flovent 220 - 2 inhalations 2 times a day during "FLARE UP"  8. Return to clinic in 6 months or earlier if there is a problem.  9. Obtain COVID vaccine when available.  I think that Daniela has been having some issues with fungal overgrowth involving her oral cavity and possibly even a little deeper into her lungs given the use of her inhalers and exposure to systemic steroids this year and the fact that she has dry mouth syndrome with very little mucus production.  We will now start her on nystatin to be utilized after each Advair dose twice a day in hopes of treating and preventing this issue from developing again.  She will continue on a large collection of anti-inflammatory agents for her airway and therapy directed against reflux as noted above.  I will see her back in this clinic in 6 months or earlier if there is a problem.  I did have a talk with her today about receiving the Covid vaccine this year but she is very hesitant about doing so.  I have referred her to the [facility] website concerning this vaccine and a description of side effects.  Allena Katz, MD Allergy / [facility] [place]

## 2019-07-21 NOTE — Telephone Encounter (Signed)
Left message for [name] to return my call.

## 2019-07-22 ENCOUNTER — Encounter: Payer: Self-pay | Admitting: Allergy and Immunology

## 2019-07-22 NOTE — Telephone Encounter (Signed)
Virtual visit scheduled with Dr. Diona Browner 07/23/2019 at 11:00 am for pain management.

## 2019-07-23 ENCOUNTER — Encounter: Payer: Self-pay | Admitting: Family Medicine

## 2019-07-23 ENCOUNTER — Other Ambulatory Visit: Payer: Self-pay

## 2019-07-23 ENCOUNTER — Ambulatory Visit (INDEPENDENT_AMBULATORY_CARE_PROVIDER_SITE_OTHER): Payer: Medicare Other | Admitting: Family Medicine

## 2019-07-23 VITALS — Temp 97.6°F | Ht 64.5 in | Wt 219.2 lb

## 2019-07-23 DIAGNOSIS — M545 Low back pain, unspecified: Secondary | ICD-10-CM

## 2019-07-23 DIAGNOSIS — H04123 Dry eye syndrome of bilateral lacrimal glands: Secondary | ICD-10-CM | POA: Diagnosis not present

## 2019-07-23 DIAGNOSIS — K219 Gastro-esophageal reflux disease without esophagitis: Secondary | ICD-10-CM

## 2019-07-23 DIAGNOSIS — G8929 Other chronic pain: Secondary | ICD-10-CM

## 2019-07-23 DIAGNOSIS — H2513 Age-related nuclear cataract, bilateral: Secondary | ICD-10-CM | POA: Diagnosis not present

## 2019-07-23 DIAGNOSIS — H1045 Other chronic allergic conjunctivitis: Secondary | ICD-10-CM | POA: Diagnosis not present

## 2019-07-23 MED ORDER — ACETAMINOPHEN-CODEINE #3 300-30 MG PO TABS: ORAL_TABLET | ORAL | 0 refills | Status: DC

## 2019-07-23 NOTE — Assessment & Plan Note (Signed)
Stable back pain.Marland Kitchen working on home PT and exercise to encouraged weight loss.

## 2019-07-23 NOTE — Assessment & Plan Note (Signed)
Indication for chronic opioid: chronic bilateral low back pain, referred to legs  worse in cold weather Medication and dose: tylenol 3 with codeine 1 cap BID prn # pills per month:  60 lasts 3 months Last UDS date: DUE.. will schedule Opioid Treatment Agreement signed (Y/N): Reviewed.. pt will come to office to obtain signature Opioid Treatment Agreement last reviewed with patient:  Y NCCSRS reviewed this encounter (include red flags):  None in last year, prior multiple providers.

## 2019-07-23 NOTE — Patient Instructions (Addendum)
Call to schedule lab visit for Urine drug screen and  contrat the same time.  Call for  3 month chronic pain managment OV.  For breakthrough heartburn... can take PEPCID AC.

## 2019-07-23 NOTE — Progress Notes (Signed)
VIRTUAL VISIT Due to national recommendations of social distancing due to [place] 19, a virtual visit is felt to be most appropriate for this patient at this time.   I connected with the patient on 07/23/19 at 11:00 AM EST by virtual telehealth platform and verified that I am speaking with the correct person using two identifiers.   Interactive audio and video telecommunications were attempted between this provider and patient, however failed, due to patient having technical difficulties OR patient did not have access to video capability.  We continued and completed visit with audio only.   I discussed the limitations, risks, security and privacy concerns of performing an evaluation and management service by  virtual telehealth platform and the availability of in person appointments. I also discussed with the patient that there may be a patient responsible charge related to this service. The patient expressed understanding and agreed to proceed.  Patient location: Home Provider Location: [place] Participants: [name] and [name]   Chief Complaint  Patient presents with  . Pain Management  . Heartburn    History of Present Illness:  74 year old female presents for chronic pain management.  Indication for chronic opioid: chronic bilateral low back pain, referred to legs  worse in cold weather Medication and dose: tylenol 3 with codeine 1 cap BID prn # pills per month:  60 lasts 3 months Last UDS date: DUE.. will schedule Opioid Treatment Agreement signed (Y/N): Reviewed.. pt will come to office to obtain signature Opioid Treatment Agreement last reviewed with patient:  Y NCCSRS reviewed this encounter (include red flags):  None in last year, prior multiple providers.   She also reports GERD occurring  Breakthrough today (chest pain after drinking water leaving eye doctor office, no SOB). Burping, gas.  Diet triggers: salad, tomato, citris.  On pantoprazole 40 mg  daily   Past ENDO: none  COVID 19 screen No recent travel or known exposure to [place] The patient denies respiratory symptoms of COVID 19 at this time.  The importance of social distancing was discussed today.   Review of Systems  Constitutional: Negative for chills and fever.  HENT: Negative for congestion and ear pain.   Eyes: Negative for pain and redness.  Respiratory: Negative for cough and shortness of breath.   Cardiovascular: Negative for chest pain, palpitations and leg swelling.  Gastrointestinal: Negative for abdominal pain, blood in stool, constipation, diarrhea, nausea and vomiting.  Genitourinary: Negative for dysuria.  Musculoskeletal: Negative for falls and myalgias.  Skin: Negative for rash.  Neurological: Negative for dizziness.  Psychiatric/Behavioral: Negative for depression. The patient is not nervous/anxious.       Past Medical History:  Diagnosis Date  . Allergic rhinitis   . Allergy   . Arthritis   . Asthma   . Chronic headache   . Diabetes mellitus   . DVT (deep venous thrombosis) ([place])   . Dyspnea   . Heart murmur   . Hypertension   . Pulmonary embolism ([place])   . Pulmonary hypertension ([place])   . Seizures ([place])     reports that she has never smoked. She has never used smokeless tobacco. She reports previous alcohol use. She reports that she does not use drugs.   Current Outpatient Medications:  .  acetaminophen-codeine (TYLENOL #3) 300-30 MG tablet, TAKE 1 TABLET BY MOUTH EVERY 12 HOURS AS NEEDED FOR UP TO 7 DAYS FOR MODERATE PAIN, Disp: 60 tablet, Rfl: 0 .  albuterol (VENTOLIN HFA) 108 (90  Base) MCG/ACT inhaler, INHALE 2 PUFFS BY MOUTH INTO THE LUNGS EVERY 6 HOURS AS NEEDED FOR WHEEZING OR SHORTNESS OF BREATH, Disp: 54 g, Rfl: 0 .  azelastine (ASTELIN) 0.1 % nasal spray, USE 1-2 SPRAYS IN EACH NOSTRIL TWICE DAILY AS NEEDED, Disp: 30 mL, Rfl: 5 .  clobetasol cream (TEMOVATE) AB-123456789 %, Apply 1 application topically 2 (two) times daily., Disp: , Rfl:   .  cyclobenzaprine (FLEXERIL) 10 MG tablet, Take 1 tablet (10 mg total) by mouth at bedtime as needed for muscle spasms., Disp: 30 tablet, Rfl: 1 .  diltiazem (CARTIA XT) 120 MG 24 hr capsule, Take 1 capsule (120 mg total) by mouth daily. KEEP OV., Disp: 30 capsule, Rfl: 2 .  DULoxetine (CYMBALTA) 30 MG capsule, TAKE 1 CAPSULE(30 MG) BY MOUTH DAILY, Disp: 30 capsule, Rfl: 5 .  Empagliflozin-linaGLIPtin (GLYXAMBI) 10-5 MG TABS, Take 1 tablet by mouth daily., Disp: 30 tablet, Rfl: 2 .  EPINEPHrine 0.3 mg/0.3 mL IJ SOAJ injection, USE AS DIRECTED FOR LIFE THREATENING ALLERGIC REACTIONS, Disp: 2 each, Rfl: 2 .  famotidine (PEPCID) 20 MG tablet, Take 1 tablet (20 mg total) by mouth 2 (two) times daily., Disp: 60 tablet, Rfl: 0 .  fluconazole (DIFLUCAN) 150 MG tablet, Take 1 tab po today, repeat in 7 days if needed, Disp: 2 tablet, Rfl: 0 .  fluticasone (FLOVENT HFA) 220 MCG/ACT inhaler, Inhale two puffs twice daily during flare-up as needed.  Rinse, gargle, and spit after use., Disp: 12 g, Rfl: 5 .  fluticasone-salmeterol (ADVAIR HFA) 230-21 MCG/ACT inhaler, Inhale two puffs twice daily to prevent cough or wheeze. Rinse mouth after use., Disp: 12 g, Rfl: 3 .  furosemide (LASIX) 40 MG tablet, TAKE 1 TABLET(40 MG) BY MOUTH DAILY, Disp: 30 tablet, Rfl: 3 .  gabapentin (NEURONTIN) 600 MG tablet, Take one at breakfast, one at lunch, 2 at bedtime, Disp: 360 tablet, Rfl: 1 .  hydrOXYzine (ATARAX/VISTARIL) 10 MG tablet, TK 1 TO 2 TS PO HS, Disp: , Rfl: 1 .  ipratropium (ATROVENT) 0.06 % nasal spray, Place 1-2 sprays each nostril 1-2 times per day, Disp: 15 mL, Rfl: 5 .  irbesartan (AVAPRO) 150 MG tablet, TAKE 1 TABLET(150 MG) BY MOUTH DAILY, Disp: 30 tablet, Rfl: 9 .  Lactase (DAIRY-RELIEF PO), Take 1 capsule by mouth daily as needed (stomach upset). , Disp: , Rfl:  .  levalbuterol (XOPENEX) 1.25 MG/3ML nebulizer solution, Take 1.25 mg by nebulization every 6 (six) hours as needed for wheezing or shortness of  breath., Disp: 1080 mL, Rfl: 1 .  Magnesium 250 MG TABS, Take 1 tablet by mouth daily., Disp: , Rfl:  .  meclizine (ANTIVERT) 12.5 MG tablet, Take 1 tablet (12.5 mg total) by mouth 3 (three) times daily as needed for dizziness., Disp: 15 tablet, Rfl: 0 .  methimazole (TAPAZOLE) 5 MG tablet, Take 2.5 mg daily by mouth, with meals, Disp: 30 tablet, Rfl: 5 .  montelukast (SINGULAIR) 10 MG tablet, TAKE 1 TABLET BY MOUTH EVERY DAY, Disp: 90 tablet, Rfl: 1 .  nystatin (MYCOSTATIN) 100000 UNIT/ML suspension, Swish and swallow 23mL after Advair 2 times daily., Disp: 60 mL, Rfl: 5 .  ondansetron (ZOFRAN-ODT) 4 MG disintegrating tablet, DISSOLVE 1 TO 2 TABLETS ON THE TONGUE THEN SWALLOW EVERY 8 HOURS AS NEEDED FOR NAUSEA, Disp: , Rfl:  .  ONETOUCH DELICA LANCETS 99991111 MISC, as needed (blood check). , Disp: , Rfl:  .  ONETOUCH VERIO test strip, 1 each by Other route as needed (blood monitoring). ,  Disp: , Rfl:  .  oxybutynin (DITROPAN-XL) 10 MG 24 hr tablet, oxybutynin chloride ER 10 mg tablet,extended release 24 hr, Disp: , Rfl:  .  pantoprazole (PROTONIX) 40 MG tablet, Take 1 tablet (40 mg total) by mouth daily., Disp: 90 tablet, Rfl: 1 .  PATADAY 0.2 % SOLN, INT 1 GTT IN OU QD PRN, Disp: , Rfl: 4 .  polyethylene glycol (MIRALAX / GLYCOLAX) packet, Take 17 g by mouth daily., Disp: , Rfl:  .  Potassium 99 MG TABS, Take 99 mg by mouth daily., Disp: , Rfl:  .  predniSONE (DELTASONE) 10 MG tablet, Take two tablets (20mg ) twice daily for three days, then one tablet (10mg ) twice daily for three days, then STOP., Disp: 18 tablet, Rfl: 0 .  predniSONE (DELTASONE) 5 MG tablet, prednisone 5 mg tablet  1 tab tid x 2 days, 1 tab bid x 5 days, 1 tab qd till finished, Disp: , Rfl:  .  Probiotic Product (ALIGN) 4 MG CAPS, Take 1 capsule by mouth daily., Disp: , Rfl:  .  rivaroxaban (XARELTO) 20 MG TABS tablet, Take 1 tablet (20 mg total) by mouth daily with supper., Disp: 90 tablet, Rfl: 1 .  SUMAtriptan (IMITREX) 100 MG  tablet, Take 100 mg x 1 may, repeat in 2 hour for migraine., Disp: 10 tablet, Rfl: 0 .  Tiotropium Bromide Monohydrate (SPIRIVA RESPIMAT) 1.25 MCG/ACT AERS, Inhale 2 puffs into the lungs daily., Disp: 4 g, Rfl: 5 .  tolterodine (DETROL LA) 4 MG 24 hr capsule, Take 4 mg by mouth daily., Disp: , Rfl:  .  triamcinolone cream (KENALOG) 0.1 %, Apply 1 application topically 2 (two) times daily., Disp: 30 g, Rfl: 0 .  TRULANCE 3 MG TABS, Take 1 tablet by mouth daily. , Disp: , Rfl:   Current Facility-Administered Medications:  .  omalizumab Arvid Right) injection 300 mg, 300 mg, Subcutaneous, Q28 days, Kozlow, Donnamarie Poag, MD, 300 mg at 07/13/19 1437   Observations/Objective: Temperature 97.6 F (36.4 C), temperature source Oral, height 5' 4.5" (1.638 m), weight 219 lb 3 oz (99.4 kg).  Physical Exam Constitutional:      General: The patient is not in acute distress. Pulmonary:     Effort: Pulmonary effort is normal. No respiratory distress.  Neurological:     Mental Status: The patient is alert and oriented to person, place, and time.  Psychiatric:        Mood and Affect: Mood normal.        Behavior: Behavior normal.     Assessment and Plan Encounter for chronic pain management Indication for chronic opioid: chronic bilateral low back pain, referred to legs  worse in cold weather Medication and dose: tylenol 3 with codeine 1 cap BID prn # pills per month:  60 lasts 3 months Last UDS date: DUE.. will schedule Opioid Treatment Agreement signed (Y/N): Reviewed.. pt will come to office to obtain signature Opioid Treatment Agreement last reviewed with patient:  Y NCCSRS reviewed this encounter (include red flags):  None in last year, prior multiple providers.    Chronic low back pain without sciatica Stable back pain.Marland Kitchen working on home PT and exercise to encouraged weight loss.  GERD (gastroesophageal reflux disease) Episode of chest pain more consistent with GERD... use PEPCID with symptoms,  but if continuing or exertional..  Call cardiology for further in person eval.  reviewed trigger avoidance and need for weight loss.     I discussed the assessment and treatment plan with the patient.  The patient was provided an opportunity to ask questions and all were answered. The patient agreed with the plan and demonstrated an understanding of the instructions.   The patient was advised to call back or seek an in-person evaluation if the symptoms worsen or if the condition fails to improve as anticipated.  Total visit time 15 minutes, > 50% spent counseling and cordinating patients care.     Alicia Lofts, MD

## 2019-07-23 NOTE — Assessment & Plan Note (Signed)
Episode of chest pain more consistent with GERD... use PEPCID with symptoms, but if continuing or exertional..  Call cardiology for further in person eval.  reviewed trigger avoidance and need for weight loss.

## 2019-07-23 NOTE — Telephone Encounter (Signed)
Patient called.  Patient had a virtual visit today with Dr.Bedsole.  Patient's requesting pain medication be sent in to [facility]. By Kristopher Oppenheim.  Patient said she's in a lot of pain.

## 2019-07-27 ENCOUNTER — Other Ambulatory Visit: Payer: Medicare Other

## 2019-07-27 DIAGNOSIS — G8929 Other chronic pain: Secondary | ICD-10-CM

## 2019-07-27 DIAGNOSIS — M545 Low back pain, unspecified: Secondary | ICD-10-CM

## 2019-07-28 ENCOUNTER — Other Ambulatory Visit: Payer: Medicare Other

## 2019-07-30 LAB — PAIN MGMT, PROFILE 8 W/CONF, U
6 Acetylmorphine: NEGATIVE ng/mL
Alcohol Metabolites: NEGATIVE ng/mL (ref <500)
Amphetamines: NEGATIVE ng/mL
Benzodiazepines: NEGATIVE ng/mL
Buprenorphine, Urine: NEGATIVE ng/mL
Cocaine Metabolite: NEGATIVE ng/mL
Codeine: 363 ng/mL
Creatinine: 59 mg/dL
Hydrocodone: NEGATIVE ng/mL
Hydromorphone: NEGATIVE ng/mL
MDMA: NEGATIVE ng/mL
Marijuana Metabolite: NEGATIVE ng/mL
Marijuana Metabolite: NEGATIVE ng/mL
Morphine: 97 ng/mL
Norhydrocodone: NEGATIVE ng/mL
Opiates: POSITIVE ng/mL
Oxidant: NEGATIVE ug/mL
Oxycodone: NEGATIVE ng/mL
pH: 6.1 (ref 4.5–9.0)

## 2019-08-04 ENCOUNTER — Ambulatory Visit (INDEPENDENT_AMBULATORY_CARE_PROVIDER_SITE_OTHER): Payer: Medicare Other | Admitting: Family Medicine

## 2019-08-04 ENCOUNTER — Telehealth: Payer: Self-pay | Admitting: Allergy and Immunology

## 2019-08-04 ENCOUNTER — Encounter: Payer: Self-pay | Admitting: Family Medicine

## 2019-08-04 VITALS — Temp 97.0°F | Ht 64.5 in | Wt 228.1 lb

## 2019-08-04 DIAGNOSIS — J029 Acute pharyngitis, unspecified: Secondary | ICD-10-CM | POA: Diagnosis not present

## 2019-08-04 DIAGNOSIS — K219 Gastro-esophageal reflux disease without esophagitis: Secondary | ICD-10-CM | POA: Diagnosis not present

## 2019-08-04 MED ORDER — GLYXAMBI 10-5 MG PO TABS: 1.0000 | ORAL_TABLET | Freq: Every day | ORAL | 11 refills | Status: DC

## 2019-08-04 NOTE — Telephone Encounter (Signed)
Patient called and would like someone to called her back to discuss added medication. Patient would not give the names of the medications.  Please advise.

## 2019-08-04 NOTE — Telephone Encounter (Signed)
Called and spoke with the patient and she was adament that she speak with Dr. Neldon Mc directly regarding her inhalers and having to use the Nystatin after her inhaler due to the thrush that she experiences. She states that the pharmacist questions her every time she picks up these medications as to why she is on 2 different inhalers since they are "very similar" and she is wondering if she can be taken off of one of the medications. Patient did state that she would like to speak with Dr. Neldon Mc directly about this. Please advise.

## 2019-08-04 NOTE — Progress Notes (Addendum)
VIRTUAL VISIT Due to national recommendations of social distancing due to [place] 19, a virtual visit is felt to be most appropriate for this patient at this time.   I connected with the patient on 08/04/19 at  4:00 PM EST by virtual telehealth platform and verified that I am speaking with the correct person using two identifiers.   Interactive audio and video telecommunications were attempted between this provider and patient, however failed, due to patient having technical difficulties OR patient did not have access to video capability.  We continued and completed visit with audio only.   I discussed the limitations, risks, security and privacy concerns of performing an evaluation and management service by  virtual telehealth platform and the availability of in person appointments. I also discussed with the patient that there may be a patient responsible charge related to this service. The patient expressed understanding and agreed to proceed.  Patient location: Home Provider Location: [place] Participants: [name] and [name]   Chief Complaint  Patient presents with  . Sore Throat    off and on for a while ?Fungus infection due to inhalers  . Hoarse    mostly in the morning    History of Present Illness:  74 year old female with diabetes, asthma, allergies HTN, GERD, OSA, afib presents with new onset sore throat and hoarse voice in AMs. This has been ongoing for several weeks.  No white plaque  In mouth or on tounge. Mouth is dry. No fever, no post nasal no congestion, occ cough. Stable SOB  She is taking Advair as well as flovent..   At last OV on 1/7.Marland KitchenMarland KitchenShe reported GERD... despite PPI pantoprazole. I suggested addition of  Pepcid AC prn.  She having some intermittant issues with reflux.    COVID 19 screen No recent travel or known exposure to COVID19 The patient denies respiratory symptoms of COVID 19 at this time.  The importance of social  distancing was discussed today.   Review of Systems  Constitutional: Negative for chills and fever.  HENT: Positive for sore throat. Negative for congestion and ear pain.   Eyes: Negative for pain and redness.  Respiratory: Negative for cough and shortness of breath.   Cardiovascular: Negative for chest pain, palpitations and leg swelling.  Gastrointestinal: Positive for heartburn. Negative for abdominal pain, blood in stool, constipation, diarrhea, nausea and vomiting.  Genitourinary: Negative for dysuria.  Musculoskeletal: Negative for falls and myalgias.  Skin: Negative for rash.  Neurological: Negative for dizziness.  Psychiatric/Behavioral: Negative for depression. The patient is not nervous/anxious.       Past Medical History:  Diagnosis Date  . Allergic rhinitis   . Allergy   . Arthritis   . Asthma   . Chronic headache   . Diabetes mellitus   . DVT (deep venous thrombosis) ([employer])   . Dyspnea   . Heart murmur   . Hypertension   . Pulmonary embolism ([place])   . Pulmonary hypertension ([place])   . Seizures ([place])     reports that she has never smoked. She has never used smokeless tobacco. She reports previous alcohol use. She reports that she does not use drugs.   Current Outpatient Medications:  .  acetaminophen-codeine (TYLENOL #3) 300-30 MG tablet, TAKE 1 TABLET BY MOUTH EVERY 12 HOURS AS NEEDED FOR UP TO 7 DAYS FOR MODERATE PAIN, Disp: 60 tablet, Rfl: 0 .  albuterol (VENTOLIN HFA) 108 (90 Base) MCG/ACT inhaler, INHALE 2 PUFFS BY MOUTH INTO  THE LUNGS EVERY 6 HOURS AS NEEDED FOR WHEEZING OR SHORTNESS OF BREATH, Disp: 54 g, Rfl: 0 .  azelastine (ASTELIN) 0.1 % nasal spray, USE 1-2 SPRAYS IN EACH NOSTRIL TWICE DAILY AS NEEDED, Disp: 30 mL, Rfl: 5 .  clobetasol cream (TEMOVATE) [id number] %, Apply 1 application topically 2 (two) times daily., Disp: , Rfl:  .  cyclobenzaprine (FLEXERIL) 10 MG tablet, Take 1 tablet (10 mg total) by mouth at bedtime as needed for muscle spasms., Disp: 30  tablet, Rfl: 1 .  diltiazem (CARTIA XT) 120 MG 24 hr capsule, Take 1 capsule (120 mg total) by mouth daily. KEEP OV., Disp: 30 capsule, Rfl: 2 .  DULoxetine (CYMBALTA) 30 MG capsule, TAKE 1 CAPSULE(30 MG) BY MOUTH DAILY, Disp: 30 capsule, Rfl: 5 .  Empagliflozin-linaGLIPtin (GLYXAMBI) 10-5 MG TABS, Take 1 tablet by mouth daily., Disp: 30 tablet, Rfl: 2 .  EPINEPHrine 0.3 mg/0.3 mL IJ SOAJ injection, USE AS DIRECTED FOR LIFE THREATENING ALLERGIC REACTIONS, Disp: 2 each, Rfl: 2 .  famotidine (PEPCID) 20 MG tablet, Take 1 tablet (20 mg total) by mouth 2 (two) times daily., Disp: 60 tablet, Rfl: 0 .  fluconazole (DIFLUCAN) 150 MG tablet, Take 1 tab po today, repeat in 7 days if needed, Disp: 2 tablet, Rfl: 0 .  fluticasone (FLOVENT HFA) 220 MCG/ACT inhaler, Inhale two puffs twice daily during flare-up as needed.  Rinse, gargle, and spit after use., Disp: 12 g, Rfl: 5 .  fluticasone-salmeterol (ADVAIR HFA) 230-21 MCG/ACT inhaler, Inhale two puffs twice daily to prevent cough or wheeze. Rinse mouth after use., Disp: 12 g, Rfl: 3 .  furosemide (LASIX) 40 MG tablet, TAKE 1 TABLET(40 MG) BY MOUTH DAILY, Disp: 30 tablet, Rfl: 3 .  gabapentin (NEURONTIN) 600 MG tablet, Take one at breakfast, one at lunch, 2 at bedtime, Disp: 360 tablet, Rfl: 1 .  hydrOXYzine (ATARAX/VISTARIL) 10 MG tablet, TK 1 TO 2 TS PO HS, Disp: , Rfl: 1 .  ipratropium (ATROVENT) 0.06 % nasal spray, Place 1-2 sprays each nostril 1-2 times per day, Disp: 15 mL, Rfl: 5 .  irbesartan (AVAPRO) 150 MG tablet, TAKE 1 TABLET(150 MG) BY MOUTH DAILY, Disp: 30 tablet, Rfl: 9 .  Lactase (DAIRY-RELIEF PO), Take 1 capsule by mouth daily as needed (stomach upset). , Disp: , Rfl:  .  levalbuterol (XOPENEX) 1.25 MG/3ML nebulizer solution, Take 1.25 mg by nebulization every 6 (six) hours as needed for wheezing or shortness of breath., Disp: 1080 mL, Rfl: 1 .  Magnesium 250 MG TABS, Take 1 tablet by mouth daily., Disp: , Rfl:  .  meclizine (ANTIVERT) 12.5 MG  tablet, Take 1 tablet (12.5 mg total) by mouth 3 (three) times daily as needed for dizziness., Disp: 15 tablet, Rfl: 0 .  methimazole (TAPAZOLE) 5 MG tablet, Take 2.5 mg daily by mouth, with meals, Disp: 30 tablet, Rfl: 5 .  montelukast (SINGULAIR) 10 MG tablet, TAKE 1 TABLET BY MOUTH EVERY DAY, Disp: 90 tablet, Rfl: 1 .  nystatin (MYCOSTATIN) 100000 UNIT/ML suspension, Swish and swallow 59mL after Advair 2 times daily., Disp: 60 mL, Rfl: 5 .  ondansetron (ZOFRAN-ODT) 4 MG disintegrating tablet, DISSOLVE 1 TO 2 TABLETS ON THE TONGUE THEN SWALLOW EVERY 8 HOURS AS NEEDED FOR NAUSEA, Disp: , Rfl:  .  ONETOUCH DELICA LANCETS 99991111 MISC, as needed (blood check). , Disp: , Rfl:  .  ONETOUCH VERIO test strip, 1 each by Other route as needed (blood monitoring). , Disp: , Rfl:  .  oxybutynin (DITROPAN-XL)  10 MG 24 hr tablet, oxybutynin chloride ER 10 mg tablet,extended release 24 hr, Disp: , Rfl:  .  pantoprazole (PROTONIX) 40 MG tablet, Take 1 tablet (40 mg total) by mouth daily., Disp: 90 tablet, Rfl: 1 .  PATADAY 0.2 % SOLN, INT 1 GTT IN OU QD PRN, Disp: , Rfl: 4 .  polyethylene glycol (MIRALAX / GLYCOLAX) packet, Take 17 g by mouth daily., Disp: , Rfl:  .  Potassium 99 MG TABS, Take 99 mg by mouth daily., Disp: , Rfl:  .  predniSONE (DELTASONE) 10 MG tablet, Take two tablets (20mg ) twice daily for three days, then one tablet (10mg ) twice daily for three days, then STOP., Disp: 18 tablet, Rfl: 0 .  predniSONE (DELTASONE) 5 MG tablet, prednisone 5 mg tablet  1 tab tid x 2 days, 1 tab bid x 5 days, 1 tab qd till finished, Disp: , Rfl:  .  Probiotic Product (ALIGN) 4 MG CAPS, Take 1 capsule by mouth daily., Disp: , Rfl:  .  rivaroxaban (XARELTO) 20 MG TABS tablet, Take 1 tablet (20 mg total) by mouth daily with supper., Disp: 90 tablet, Rfl: 1 .  SUMAtriptan (IMITREX) 100 MG tablet, Take 100 mg x 1 may, repeat in 2 hour for migraine., Disp: 10 tablet, Rfl: 0 .  Tiotropium Bromide Monohydrate (SPIRIVA RESPIMAT)  1.25 MCG/ACT AERS, Inhale 2 puffs into the lungs daily., Disp: 4 g, Rfl: 5 .  tolterodine (DETROL LA) 4 MG 24 hr capsule, Take 4 mg by mouth daily., Disp: , Rfl:  .  triamcinolone cream (KENALOG) 0.1 %, Apply 1 application topically 2 (two) times daily., Disp: 30 g, Rfl: 0 .  TRULANCE 3 MG TABS, Take 1 tablet by mouth daily. , Disp: , Rfl:   Current Facility-Administered Medications:  .  omalizumab Arvid Right) injection 300 mg, 300 mg, Subcutaneous, Q28 days, Kozlow, Donnamarie Poag, MD, 300 mg at 07/13/19 1437   Observations/Objective: Temperature (!) 97 F (36.1 C), temperature source Oral, height 5' 4.5" (1.638 m), weight 228 lb 2 oz (103.5 kg).  Physical Exam  Physical Exam Constitutional:      General: The patient is not in acute distress. Pulmonary:     Effort: Pulmonary effort is normal. No respiratory distress.  Neurological:     Mental Status: The patient is alert and oriented to person, place, and time.  Psychiatric:        Mood and Affect: Mood normal.        Behavior: Behavior normal.   Assessment and Plan  HOarse voice, ST: no clear sign of infeciton. Not clearly med SE.  No clear fungal infeciton but unable to examine pt.   Most likely due to GERD.  Add H2 blocker BID to PPI. If not improving.. refer back to GI and consider in office eval.   I discussed the assessment and treatment plan with the patient. The patient was provided an opportunity to ask questions and all were answered. The patient agreed with the plan and demonstrated an understanding of the instructions.   The patient was advised to call back or seek an in-person evaluation if the symptoms worsen or if the condition fails to improve as anticipated.   Total visit time 15 minutes, > 50% spent counseling and cordinating patients care.   [name], MD

## 2019-08-04 NOTE — Patient Instructions (Addendum)
Continue pantoprazole 40 mg daily  Start twice daily pepcid AC.  If sore throat, hoarse voice and reflux.. if not resolved in 2 weeks, need further GI evaluation.

## 2019-08-10 ENCOUNTER — Ambulatory Visit: Payer: Medicare Other

## 2019-08-10 DIAGNOSIS — J4550 Severe persistent asthma, uncomplicated: Secondary | ICD-10-CM

## 2019-08-10 NOTE — Telephone Encounter (Signed)
Discussed this issue with patient.  The pharmacist was confused.  For some reason the pharmacist inform [name] that Spiriva and Advair with the same medication.  She does have morning hoarseness and this is probably a result of her medication use.  I did inform her that she has the option of trying her Advair 230 only in the morning along with her Spiriva and she can see how she does regarding her asthma control on this approach.  Of course, if she has more asthma activity then she will need to go back up to Advair twice a day.

## 2019-08-11 ENCOUNTER — Other Ambulatory Visit: Payer: Self-pay

## 2019-08-11 ENCOUNTER — Ambulatory Visit (INDEPENDENT_AMBULATORY_CARE_PROVIDER_SITE_OTHER): Payer: Medicare Other

## 2019-08-11 DIAGNOSIS — J4550 Severe persistent asthma, uncomplicated: Secondary | ICD-10-CM

## 2019-08-12 ENCOUNTER — Telehealth: Payer: Self-pay | Admitting: Cardiovascular Disease

## 2019-08-12 ENCOUNTER — Telehealth: Payer: Self-pay

## 2019-08-12 NOTE — Telephone Encounter (Signed)
Patient asking for Dr. Victorino December nurse to give her a call. I asked her if there was a message I could send up to her in regards to what it may be about and she she stated that she just needs for his nurse to call her.

## 2019-08-12 NOTE — Telephone Encounter (Signed)
Pt left v/m that she had visit on 08/04/19 and discussed with Dr Diona Browner about gas and heartburn in pts chest. Pt has finished taking the prilosec that she had and wants to know what Dr Diona Browner recommended for pt to take.  Here are copy of instructions at the 08/04/19 visit and pt notified as instructed and voiced understanding.  Continue pantoprazole 40 mg daily  Start twice daily pepcid AC.  If sore throat, hoarse voice and reflux.. if not resolved in 2 weeks, need further GI evaluation.

## 2019-08-12 NOTE — Telephone Encounter (Signed)
Patient does mention having lower right side back pain moving into her hip- and moved into her leg, (had blood clots previously)  Right leg aching.  Patient denies having swelling or redness, just soreness- states that it is sore feeling starting today.  Patient has been taking tylenol 3, but not helping much.  No changes to any other medications.  Patient denies current chest pain/ does mention having SOB (but she states she does have asthma, and has been using her nebulizer)    I advised with patient it would be rare to have a blood clot- since on blood thinner, and no other symptoms, but I would reach out to Dr.C to advise- it could be arthritis but I wanted to check with MD.

## 2019-08-13 NOTE — Telephone Encounter (Signed)
Called patient, advised of message.  Patient will contact PCP to see if anything to do about the possible sciatica pain. Patient verbalized understanding

## 2019-08-13 NOTE — Telephone Encounter (Signed)
I agree that her symptoms are not at all typical for a blood clot and it is also very unlikely she would get a clot if she is taking her meds. Sounds much more like sciatica.

## 2019-08-16 ENCOUNTER — Other Ambulatory Visit: Payer: Self-pay | Admitting: Allergy and Immunology

## 2019-08-27 DIAGNOSIS — N3941 Urge incontinence: Secondary | ICD-10-CM | POA: Diagnosis not present

## 2019-08-27 DIAGNOSIS — R351 Nocturia: Secondary | ICD-10-CM | POA: Diagnosis not present

## 2019-08-30 ENCOUNTER — Other Ambulatory Visit: Payer: Self-pay | Admitting: Allergy and Immunology

## 2019-08-31 ENCOUNTER — Other Ambulatory Visit: Payer: Self-pay

## 2019-08-31 MED ORDER — RIVAROXABAN 20 MG PO TABS: 20.0000 mg | ORAL_TABLET | Freq: Every day | ORAL | 1 refills | Status: DC

## 2019-09-07 DIAGNOSIS — J4550 Severe persistent asthma, uncomplicated: Secondary | ICD-10-CM

## 2019-09-08 ENCOUNTER — Ambulatory Visit (INDEPENDENT_AMBULATORY_CARE_PROVIDER_SITE_OTHER): Payer: Medicare Other

## 2019-09-08 ENCOUNTER — Other Ambulatory Visit: Payer: Self-pay

## 2019-09-08 DIAGNOSIS — J4550 Severe persistent asthma, uncomplicated: Secondary | ICD-10-CM | POA: Diagnosis not present

## 2019-09-16 ENCOUNTER — Telehealth: Payer: Self-pay | Admitting: Allergy and Immunology

## 2019-09-16 NOTE — Telephone Encounter (Signed)
Jaloni called in and states she has been taking Nystatin for her throat and wants to know how long she should stay on Nystatin.  Also, Ajada wants to speak to Dr. Neldon Mc directly.  She states she is still having back pain but she has been wheezing "quite a bit in the last few weeks"  She states she has been using her nebulizer 2-3 times a week and she is using her emergency inhaler every day.  She states she has tried to cut back on the use but she is finding that she can't and needs it daily.  Dashana would like Dr. Neldon Mc to call her at [phone].

## 2019-09-17 NOTE — Telephone Encounter (Signed)
Discussed with patient. Nebulizer daily use for wheezing without coughing for 2 weeks. A light wheezing from throat breathing in and out. No nasal symptoms. No new throat problems except very dry. No fever, very tired, but no aches, chills, muscle aches. She will attempt to decrease bladder medications and stop Spiriva to see if this helps dry mouth and throat and decrease wheezing. She will let us know about response.

## 2019-09-28 DIAGNOSIS — D225 Melanocytic nevi of trunk: Secondary | ICD-10-CM | POA: Diagnosis not present

## 2019-09-28 DIAGNOSIS — L821 Other seborrheic keratosis: Secondary | ICD-10-CM | POA: Diagnosis not present

## 2019-09-28 DIAGNOSIS — R202 Paresthesia of skin: Secondary | ICD-10-CM | POA: Diagnosis not present

## 2019-09-28 DIAGNOSIS — D485 Neoplasm of uncertain behavior of skin: Secondary | ICD-10-CM | POA: Diagnosis not present

## 2019-09-29 ENCOUNTER — Encounter: Payer: Self-pay | Admitting: Podiatry

## 2019-09-29 ENCOUNTER — Ambulatory Visit (INDEPENDENT_AMBULATORY_CARE_PROVIDER_SITE_OTHER): Payer: Medicare Other | Admitting: Podiatry

## 2019-09-29 ENCOUNTER — Other Ambulatory Visit: Payer: Self-pay

## 2019-09-29 DIAGNOSIS — E1142 Type 2 diabetes mellitus with diabetic polyneuropathy: Secondary | ICD-10-CM

## 2019-09-29 DIAGNOSIS — Q828 Other specified congenital malformations of skin: Secondary | ICD-10-CM | POA: Diagnosis not present

## 2019-09-29 DIAGNOSIS — B351 Tinea unguium: Secondary | ICD-10-CM

## 2019-09-29 DIAGNOSIS — M79676 Pain in unspecified toe(s): Secondary | ICD-10-CM

## 2019-09-29 NOTE — Progress Notes (Signed)
She presents today for follow-up of her diabetes and her diabetic foot she states that her neuropathy is really been giving her feet lately.  She has painfully elongated toenails and calluses.  She is questioning as to whether or not she has should have a small spur that is noted on the plantar medial aspect of the hallux interphalangeal joint removed because of the buildup of skin there.  Objective: Vital signs are stable she is alert and oriented x3 pulses are palpable.  Neurologic sensorium is diminished per Semmes Weinstein monofilament.  Cutaneous evaluation demonstrates reactive hyperkeratotic lesion overlying the base of the distal phalanx medially consistent with a small spur that is associated with a fused hallux interphalangeal joint.  This is resulted in reactive hyperkeratotic tissue there is no signs of skin breakdown or blood beneath the tissue.  She also has a painful thick black toenail second toe left foot.  Otherwise toenails 2 through 5 bilaterally are thickened and discolored.  Assessment: Pain in limb secondary to diabetic peripheral neuropathy secondary to onychomycosis secondary to spur plantar medial aspect of the hallux.  Plan: Discussed etiology pathology conservative surgical therapies at this point we are going to call in a compound for her.  We also debrided all reactive hyperkeratotic tissue debrided nails 1 through 5 bilaterally.  Discussed nail removal second toe left foot permanently also discussed surgery as far as exostectomy.  She understands this is amenable to it would consider surgery at the surgery center.  We will follow-up with me in about 3 months

## 2019-10-05 ENCOUNTER — Ambulatory Visit: Payer: Medicare Other | Admitting: Internal Medicine

## 2019-10-05 DIAGNOSIS — J4550 Severe persistent asthma, uncomplicated: Secondary | ICD-10-CM

## 2019-10-06 ENCOUNTER — Ambulatory Visit (INDEPENDENT_AMBULATORY_CARE_PROVIDER_SITE_OTHER): Payer: Medicare Other

## 2019-10-06 ENCOUNTER — Other Ambulatory Visit: Payer: Self-pay

## 2019-10-06 DIAGNOSIS — J4550 Severe persistent asthma, uncomplicated: Secondary | ICD-10-CM | POA: Diagnosis not present

## 2019-10-06 DIAGNOSIS — M1711 Unilateral primary osteoarthritis, right knee: Secondary | ICD-10-CM | POA: Diagnosis not present

## 2019-10-06 DIAGNOSIS — M5136 Other intervertebral disc degeneration, lumbar region: Secondary | ICD-10-CM | POA: Diagnosis not present

## 2019-10-08 ENCOUNTER — Telehealth: Payer: Self-pay | Admitting: Family Medicine

## 2019-10-08 NOTE — Chronic Care Management (AMB) (Signed)
°  Chronic Care Management   Note  10/08/2019 Name: [name] MRN: [id number] DOB: 1945/07/26  [name] is a 74 y.o. year old female who is a primary care patient of Bedsole, Amy E, MD. I reached out to [facility] by phone today in response to a referral sent by [name]'s PCP, [name], MD.   [name] was given information about Chronic Care Management services today including:  1. CCM service includes personalized support from designated clinical staff supervised by her physician, including individualized plan of care and coordination with other care providers 2. 24/7 contact phone numbers for assistance for urgent and routine care needs. 3. Service will only be billed when office clinical staff spend 20 minutes or more in a month to coordinate care. 4. Only one practitioner may furnish and bill the service in a calendar month. 5. The patient may stop CCM services at any time (effective at the end of the month) by phone call to the office staff.   Patient agreed to services and verbal consent obtained.   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

## 2019-10-12 ENCOUNTER — Telehealth: Payer: Self-pay | Admitting: Family Medicine

## 2019-10-12 ENCOUNTER — Other Ambulatory Visit: Payer: Self-pay | Admitting: Internal Medicine

## 2019-10-12 ENCOUNTER — Other Ambulatory Visit: Payer: Self-pay | Admitting: Allergy and Immunology

## 2019-10-12 ENCOUNTER — Telehealth: Payer: Self-pay | Admitting: Cardiovascular Disease

## 2019-10-12 MED ORDER — FLUCONAZOLE 150 MG PO TABS: ORAL_TABLET | ORAL | 0 refills | Status: DC

## 2019-10-12 NOTE — Telephone Encounter (Signed)
[name] notified as instructed by telephone.  She states she also needs a refill on her pain medicine Tylenol #3.  Last refilled 07/23/2019 for #60 with no refills.

## 2019-10-12 NOTE — Addendum Note (Signed)
Addended by: Carter Kitten on: 10/12/2019 12:54 PM   Modules accepted: Orders

## 2019-10-12 NOTE — Telephone Encounter (Signed)
I will send in diflucan for vaginal yeast infection following prednisone.  Anticoagulation is not a contraindication to vaccination; excess bleeding is unlikely with intramuscular vaccines in patients taking anticoagulants . She can  hold pressure over the injection site to reduce the risk of hematoma.

## 2019-10-12 NOTE — Telephone Encounter (Signed)
We are recommending the COVID-19 vaccine to all of our patients. Cardiac medications (including blood thinners) should not deter anyone from being vaccinated and there is no need to hold any of those medications prior to vaccine administration.     Currently, there is a hotline to call (active 07/24/19) to schedule vaccination appointments as no walk-ins will be accepted.   Number: [phone].    If an appointment is not available please go to [email or web address] to sign up for notification when additional vaccine appointments are available.   If you have further questions or concerns about the vaccine process, please visit [email or web address] or contact your primary care physician.   

## 2019-10-12 NOTE — Telephone Encounter (Signed)
Patient called requesting a call back  She stated that she tried to contact the pharmacy but because of the medication she is needing she needed to speak with the nurse. Asked patient the name of the medication and she stated she did not know the name. But it was for a yeast infection.

## 2019-10-12 NOTE — Telephone Encounter (Addendum)
Spoke with [name].  She states she was prescribed prednisone last week for her back and knee and now she has a yeast infection. She states she is having vaginal itching/burning.  She has been using Nystatin cream that she had but she realized it expired 01/2018.  She is asking if a prescription for Diflucan be sent in for her.  She also has decided to get the Covid vaccine and was told to check with her doctor to make sure it is okay that she get it because she is on blood thinner.  Please advise.

## 2019-10-13 MED ORDER — ACETAMINOPHEN-CODEINE #3 300-30 MG PO TABS: ORAL_TABLET | ORAL | 0 refills | Status: DC

## 2019-10-13 NOTE — Addendum Note (Signed)
Addended by: [name] E on: 10/13/2019 09:15 AM   Modules accepted: Orders

## 2019-10-20 ENCOUNTER — Ambulatory Visit: Payer: Medicare Other | Admitting: Internal Medicine

## 2019-10-20 ENCOUNTER — Other Ambulatory Visit: Payer: Self-pay

## 2019-10-20 ENCOUNTER — Ambulatory Visit (INDEPENDENT_AMBULATORY_CARE_PROVIDER_SITE_OTHER): Payer: Medicare Other | Admitting: Internal Medicine

## 2019-10-20 ENCOUNTER — Encounter: Payer: Self-pay | Admitting: Family Medicine

## 2019-10-20 ENCOUNTER — Encounter: Payer: Self-pay | Admitting: Internal Medicine

## 2019-10-20 ENCOUNTER — Other Ambulatory Visit: Payer: Self-pay | Admitting: Family Medicine

## 2019-10-20 ENCOUNTER — Ambulatory Visit (INDEPENDENT_AMBULATORY_CARE_PROVIDER_SITE_OTHER): Payer: Medicare Other | Admitting: Family Medicine

## 2019-10-20 VITALS — BP 140/80 | HR 88 | Ht 64.5 in | Wt 216.0 lb

## 2019-10-20 VITALS — BP 106/60 | HR 79 | Temp 99.2°F | Ht 64.5 in | Wt 215.5 lb

## 2019-10-20 DIAGNOSIS — G8929 Other chronic pain: Secondary | ICD-10-CM

## 2019-10-20 DIAGNOSIS — M545 Low back pain, unspecified: Secondary | ICD-10-CM

## 2019-10-20 DIAGNOSIS — E0500 Thyrotoxicosis with diffuse goiter without thyrotoxic crisis or storm: Secondary | ICD-10-CM | POA: Diagnosis not present

## 2019-10-20 DIAGNOSIS — G894 Chronic pain syndrome: Secondary | ICD-10-CM

## 2019-10-20 DIAGNOSIS — E041 Nontoxic single thyroid nodule: Secondary | ICD-10-CM

## 2019-10-20 DIAGNOSIS — E1140 Type 2 diabetes mellitus with diabetic neuropathy, unspecified: Secondary | ICD-10-CM

## 2019-10-20 MED ORDER — ONETOUCH VERIO VI STRP: ORAL_STRIP | 5 refills | Status: DC

## 2019-10-20 MED ORDER — ONETOUCH DELICA LANCETS 33G MISC: 5 refills | Status: DC

## 2019-10-20 NOTE — Progress Notes (Addendum)
Patient ID: [name], female   DOB: 1946/01/26, 74 y.o.   MRN: [id number]   This visit occurred during the SARS-CoV-2 public health emergency.  Safety protocols were in place, including screening questions prior to the visit, additional usage of staff PPE, and extensive cleaning of exam room while observing appropriate contact time as indicated for disinfecting solutions.   HPI  [name] is a 74 y.o.-year-old female, returning for f/u for for thyrotoxicosis and thyroid nodule.  She previously saw another endocrinologist (Dr. Wilson Price), but only for her diabetes. Last OV with me 6 months ago. She is the sister of [name], also my pt.  Reviewed and addended history: Patient was found to be thyrotoxic in 08/2015 but she reports that she was only informed about this in 2018.  Thyroid scan indicated possible Graves' disease and also a cold left thyroid nodule.  At that time, she was recommended surgery but she did not want to proceed with this and started to see me.  We started methimazole. She started to feel better on methimazole, less fatigued. In 09/2018 we decreased the dose of methimazole to 2.5 mg daily.  She has no side effects from methimazole.  She also continues on diltiazem CD.  Her left thyroid nodule was biopsied with benign results in 11/2017.    Reviewed her TFTs: Lab Results  Component Value Date   TSH 1.39 04/06/2019   TSH 1.84 11/27/2018   TSH 4.30 10/06/2018   TSH 4.03 08/05/2018   TSH 6.04 (H) 06/05/2018   TSH <0.01 (L) 02/06/2018   TSH <0.01 (L) 09/27/2017   TSH 0.01 (L) 02/05/2017   TSH 0.07 (L) 03/22/2016   TSH 0.06 (L) 12/09/2015   FREET4 0.98 04/06/2019   FREET4 0.93 11/27/2018   FREET4 0.75 10/06/2018   FREET4 0.88 08/05/2018   FREET4 0.59 (L) 06/05/2018   FREET4 1.89 (H) 02/06/2018   FREET4 1.94 (H) 09/27/2017   FREET4 1.19 02/05/2017   FREET4 1.91 (H) 03/22/2016   FREET4 1.45 12/09/2015   T3FREE 2.7 04/06/2019   T3FREE 3.0 11/27/2018    T3FREE 3.1 10/06/2018   T3FREE 2.9 08/05/2018   T3FREE 2.6 06/05/2018   T3FREE 3.9 02/06/2018   T3FREE 3.4 09/27/2017   T3FREE 3.5 02/05/2017   T3FREE 3.8 03/22/2016   T3FREE 3.9 12/09/2015   Thyroid uptake and scan (10/31/2017): Increased uptake of 40%, and cold left inferior pole nodule.  Thyroid U/S (11/12/2017): 3.2 cm left inferior TR 3 nodule correlates with the cold nodule by nuclear medicine scan. This nodule meets criteria for biopsy as above.  FNA of her left thyroid nodule (12/04/2017): Benign  Her Graves' antibodies were elevated: Lab Results  Component Value Date   TSI 395 (H) 04/06/2019   TSI 361 (H) 02/06/2018   Pt denies: - feeling nodules in neck - hoarseness - dysphagia - SOB with lying down But has occasional choking and food occasionally gets stuck in her neck.  Pt does not have a FH of thyroid ds. No FH of thyroid cancer. No h/o radiation tx to head or neck.  No seaweed or kelp. No recent contrast studies. No herbal supplements. No Biotin use. + steroids  for her back - finishing a taper started last week.  Pt. also has a history of DM2, urinary urgency.  She has asthma, on 2 inhalers. Sees Dr. Neldon Price.  On Biotin - stopped few weeks ago.  ROS: Constitutional: no weight gain/+ weight loss, + occasional fatigue, no subjective hyperthermia, no  subjective hypothermia Eyes: no blurry vision, no xerophthalmia ENT: + sore throat, + see HPI Cardiovascular: no CP/no SOB/no palpitations/no leg swelling Respiratory: no cough/no SOB/no wheezing Gastrointestinal: no N/no V/no D/no C/no acid reflux Musculoskeletal: no muscle aches/+ joint aches (back and knees) Skin: no rashes, no hair loss Neurological: no tremors/no numbness/no tingling/no dizziness  I reviewed pt's medications, allergies, PMH, social hx, family hx, and changes were documented in the history of present illness. Otherwise, unchanged from my initial visit note.  Past Medical History:   Diagnosis Date  . Allergic rhinitis   . Allergy   . Arthritis   . Asthma   . Chronic headache   . Diabetes mellitus   . DVT (deep venous thrombosis) ([place])   . Dyspnea   . Heart murmur   . Hypertension   . Pulmonary embolism ([place])   . Pulmonary hypertension ([place])   . Seizures ([place])    Past Surgical History:  Procedure Laterality Date  . ABDOMINAL HYSTERECTOMY     partial, has ovaries  . CARDIAC CATHETERIZATION  12/28/2010   Mod. pulmonary hypertension, normal coronary arteries  . DOPPLER ECHOCARDIOGRAPHY  10/08/2011   EF=>55%,mild asymmetric LVH, mod. TR, mod. PH, mild to mod LA dilatation  . KNEE ARTHROSCOPY Left   . KNEE SURGERY    . Nuclear Stress Test  05/20/2006   No ischemia  . PARTIAL HYSTERECTOMY    . PLANTAR FASCIA SURGERY    . TONSILLECTOMY     Social History   Socioeconomic History  . Marital status: Widowed    Spouse name: Not on file  . Number of children: Y  . Years of education: Not on file  . Highest education level: Not on file  Occupational History  . Occupation: retired Geologist, engineering.   Tobacco Use  . Smoking status: Never Smoker  . Smokeless tobacco: Never Used  Substance and Sexual Activity  . Alcohol use: Not Currently    Alcohol/week: 0.0 standard drinks    Comment: occ glass on wine  . Drug use: No  . Sexual activity: Never  Other Topics Concern  . Not on file  Social History Narrative   Widow    limited exercise.   Social Determinants of Health   Financial Resource Strain:   . Difficulty of Paying Living Expenses:   Food Insecurity:   . Worried About Charity fundraiser in the Last Year:   . Arboriculturist in the Last Year:   Transportation Needs:   . Film/video editor (Medical):   Marland Kitchen Lack of Transportation (Non-Medical):   Physical Activity:   . Days of Exercise per Week:   . Minutes of Exercise per Session:   Stress:   . Feeling of Stress :   Social Connections:   . Frequency of Communication with Friends and  Family:   . Frequency of Social Gatherings with Friends and Family:   . Attends Religious Services:   . Active Member of Clubs or Organizations:   . Attends Archivist Meetings:   Marland Kitchen Marital Status:   Intimate Partner Violence:   . Fear of Current or Ex-Partner:   . Emotionally Abused:   Marland Kitchen Physically Abused:   . Sexually Abused:    Current Outpatient Medications on File Prior to Visit  Medication Sig Dispense Refill  . acetaminophen-codeine (TYLENOL #3) 300-30 MG tablet TAKE 1 TABLET BY MOUTH EVERY 12 HOURS AS NEEDED FOR UP TO 7 DAYS FOR MODERATE PAIN 60 tablet 0  .  albuterol (VENTOLIN HFA) 108 (90 Base) MCG/ACT inhaler INHALE 2 PUFFS BY MOUTH INTO THE LUNGS EVERY 6 HOURS AS NEEDED FOR WHEEZING OR SHORTNESS OF BREATH 54 g 0  . amLODipine (NORVASC) 5 MG tablet Add'l Sig Add'l Sig oral Add'l Sig    . Apoaequorin (PREVAGEN PO) Take 1 tablet by mouth daily.    Marland Kitchen azelastine (ASTELIN) 0.1 % nasal spray USE 1-2 SPRAYS IN EACH NOSTRIL TWICE DAILY AS NEEDED 30 mL 5  . clobetasol cream (TEMOVATE) [id number] % Apply 1 application topically 2 (two) times daily.    . cyclobenzaprine (FLEXERIL) 10 MG tablet Take 1 tablet (10 mg total) by mouth at bedtime as needed for muscle spasms. 30 tablet 1  . diltiazem (CARTIA XT) 120 MG 24 hr capsule Take 1 capsule (120 mg total) by mouth daily. KEEP OV. 30 capsule 2  . Doxepin HCl POWD Add'l Sig Add'l Sig miscellaneous Add'l Sig    . DULoxetine (CYMBALTA) 30 MG capsule TAKE 1 CAPSULE(30 MG) BY MOUTH DAILY 30 capsule 5  . Empagliflozin-linaGLIPtin (GLYXAMBI) 10-5 MG TABS Take 1 tablet by mouth daily. 30 tablet 11  . EPINEPHrine 0.3 mg/0.3 mL IJ SOAJ injection USE AS DIRECTED FOR LIFE THREATENING ALLERGIC REACTIONS 2 each 2  . famotidine (PEPCID) 20 MG tablet Take 1 tablet (20 mg total) by mouth 2 (two) times daily. 60 tablet 0  . famotidine (PEPCID) 40 MG tablet TAKE 1 TABLET(40 MG) BY MOUTH DAILY 90 tablet 1  . fluconazole (DIFLUCAN) 150 MG tablet Take 1 tab  po today, repeat in 7 days if needed 2 tablet 0  . fluticasone (FLOVENT HFA) 220 MCG/ACT inhaler Inhale two puffs twice daily during flare-up as needed.  Rinse, gargle, and spit after use. 12 g 5  . fluticasone-salmeterol (ADVAIR HFA) 230-21 MCG/ACT inhaler Inhale two puffs twice daily to prevent cough or wheeze. Rinse mouth after use. 12 g 3  . furosemide (LASIX) 40 MG tablet TAKE 1 TABLET(40 MG) BY MOUTH DAILY 30 tablet 3  . gabapentin (NEURONTIN) 300 MG capsule Add'l Sig Add'l Sig oral Add'l Sig    . gabapentin (NEURONTIN) 600 MG tablet Take one at breakfast, one at lunch, 2 at bedtime 360 tablet 1  . hydrOXYzine (ATARAX/VISTARIL) 10 MG tablet TK 1 TO 2 TS PO HS  1  . ipratropium (ATROVENT) 0.06 % nasal spray Place 1-2 sprays each nostril 1-2 times per day 15 mL 5  . irbesartan (AVAPRO) 150 MG tablet TAKE 1 TABLET(150 MG) BY MOUTH DAILY 30 tablet 9  . Lactase (DAIRY-RELIEF PO) Take 1 capsule by mouth daily as needed (stomach upset).     Marland Kitchen levalbuterol (XOPENEX) 1.25 MG/3ML nebulizer solution Take 1.25 mg by nebulization every 6 (six) hours as needed for wheezing or shortness of breath. 1080 mL 1  . Magnesium 250 MG TABS Take 1 tablet by mouth daily.    . meclizine (ANTIVERT) 12.5 MG tablet Take 1 tablet (12.5 mg total) by mouth 3 (three) times daily as needed for dizziness. 15 tablet 0  . methimazole (TAPAZOLE) 5 MG tablet TAKE 1 TABLET BY MOUTH IN THE MORNING AND 1/2 TABLET IN THE EVENING WITH MEALS 60 tablet 1  . montelukast (SINGULAIR) 10 MG tablet TAKE 1 TABLET BY MOUTH EVERY DAY 90 tablet 1  . NONFORMULARY OR COMPOUNDED [place] #11 Peripheral Neuropathy cream 5 refills  Sent in 09-29-19    . nystatin (MYCOSTATIN) 100000 UNIT/ML suspension SWISH AND SWALLOW 5 ML BY MOUTH TWICE DAILY AFTER ADVAIR 60  mL 5  . ondansetron (ZOFRAN-ODT) 4 MG disintegrating tablet DISSOLVE 1 TO 2 TABLETS ON THE TONGUE THEN SWALLOW EVERY 8 HOURS AS NEEDED FOR NAUSEA    . ONETOUCH DELICA LANCETS  99991111 MISC as needed (blood check).     [name] VERIO test strip 1 each by Other route as needed (blood monitoring).     Marland Kitchen oxybutynin (DITROPAN-XL) 10 MG 24 hr tablet oxybutynin chloride ER 10 mg tablet,extended release 24 hr    . oxybutynin (DITROPAN-XL) 5 MG 24 hr tablet Take 5 mg by mouth daily.    . pantoprazole (PROTONIX) 40 MG tablet TAKE 1 TABLET(40 MG) BY MOUTH DAILY 90 tablet 1  . PATADAY 0.2 % SOLN INT 1 GTT IN OU QD PRN  4  . Potassium 99 MG TABS Take 99 mg by mouth daily.    . predniSONE (DELTASONE) 10 MG tablet Take two tablets (20mg ) twice daily for three days, then one tablet (10mg ) twice daily for three days, then STOP. 18 tablet 0  . predniSONE (DELTASONE) 5 MG tablet prednisone 5 mg tablet  1 tab tid x 2 days, 1 tab bid x 5 days, 1 tab qd till finished    . PRESCRIPTION MEDICATION BUP 1%/DOX 3%/GAB 6%/PEN 3%/TOP 1% 30 GM-Apply 1 to 2 pumps to effected are (foot) 3 to 4 times daily as needed.    . Probiotic Product (ALIGN) 4 MG CAPS Take 1 capsule by mouth daily.    . Psyllium (METAMUCIL FIBER PO) Take 2 Scoops by mouth daily.    . rivaroxaban (XARELTO) 20 MG TABS tablet Take 1 tablet (20 mg total) by mouth daily with supper. 90 tablet 1  . SUMAtriptan (IMITREX) 100 MG tablet Take 100 mg x 1 may, repeat in 2 hour for migraine. 10 tablet 0  . Tiotropium Bromide Monohydrate (SPIRIVA RESPIMAT) 1.25 MCG/ACT AERS Inhale 2 puffs into the lungs daily. 4 g 5  . triamcinolone cream (KENALOG) 0.1 % Apply 1 application topically 2 (two) times daily. 30 g 0  . TRULANCE 3 MG TABS Take 1 tablet by mouth daily.     . valsartan (DIOVAN) 160 MG tablet Add'l Sig Add'l Sig oral Add'l Sig     Current Facility-Administered Medications on File Prior to Visit  Medication Dose Route Frequency Provider Last Rate Last Admin  . omalizumab Arvid Right) injection 300 mg  300 mg Subcutaneous Q28 days [name], MD   300 mg at 10/06/19 1113   Allergies  Allergen Reactions  . Latex Hives  . Penicillins  Swelling    Has patient had a PCN reaction causing immediate rash, facial/tongue/throat swelling, SOB or lightheadedness with hypotension patient had a PCN reaction causing severe rash involving mucus membranes or skin necrosis: [id number] Has patient had a PCN reaction that required hospitalization/No Has patient had a PCN reaction occurring within the last 10 years: No If all of the above answers are "NO", then may proceed with Cephalosporin use.     Recardo Evangelist [Pregabalin] Other (See Comments)    Blurred vision.  Marland Kitchen Phenytoin Sodium Extended Swelling    Swelling   . Vicodin [Hydrocodone-Acetaminophen] Nausea And Vomiting   Family History  Problem Relation Age of Onset  . Hypertension Mother   . Clotting disorder Mother   . Breast cancer Mother   . Arthritis Mother   . Stroke Mother   . Diabetes Mother   . Cancer Brother   . Alcohol abuse Father   . Arthritis Sister   .  Diabetes Sister   . Multiple sclerosis Sister   . Allergies Other        grandson  . Allergic rhinitis Neg Hx   . Angioedema Neg Hx   . Asthma Neg Hx   . Atopy Neg Hx   . Eczema Neg Hx   . Immunodeficiency Neg Hx   . Urticaria Neg Hx     PE: BP 140/80   Pulse 88   Ht 5' 4.5" (1.638 m)   Wt 216 lb (98 kg)   SpO2 97%   BMI 36.50 kg/m  Wt Readings from Last 3 Encounters:  10/20/19 216 lb (98 kg)  10/20/19 215 lb 8 oz (97.8 kg)  08/04/19 228 lb 2 oz (103.5 kg)   Constitutional: overweight, in NAD Eyes: PERRLA, EOMI, no exophthalmos ENT: moist mucous membranes, no thyromegaly, no cervical lymphadenopathy Cardiovascular: RRR, No MRG Respiratory: CTA B Gastrointestinal: abdomen soft, NT, ND, BS+ Musculoskeletal: no deformities, strength intact in all 4 Skin: moist, warm, no rashes Neurological: no tremor with outstretched hands, DTR normal in all 4  ASSESSMENT: 1.  Graves' disease  2.  Left thyroid nodule  3. DM2  PLAN:  1. Patient with history of thyrotoxicosis consistent with Graves'  disease.  She had thyrotoxic symptoms initially: Fatigue, heat intolerance, insomnia, but no palpitations, anxiety, hyper defecation.  The thyroid uptake and scan suggested Graves' disease and TSI antibodies confirmed the diagnosis.  She was started on methimazole, and we were able to decrease the dose to 2.5 mg daily in 09/2018.  She continues Cardizem CD, which is chronic treatment for her. -She continues to feel well on the lower dose of methimazole. She lost 12 lbs in last 3 mo. -We discussed about possible modalities of treatment for Graves' disease, to include methimazole use, RAI treatment, and, last resort, surgery.  She is responding well to methimazole so we will continue a low dose for now.  I am hoping that we can stop the medication completely in the near future. - Latest TSH reviewed and this was normal:  Lab Results  Component Value Date   TSH 1.39 04/06/2019  -We will check a TSH, free T4, free T3, and TSIs but we need to wait approximately 10 days, since she is now finishing a prednisone taper. -I will see her back in 1 year, but with labs in 6 months  2.  Left thyroid nodule -This appears to be cold on the uptake and scan -I reviewed the biopsy of the nodule from 11/2017: Benign -She denies neck compression symptoms except for choking, which was chronic in the past, but then improved.  She recently feels more choking and food getting stuck in her throat. -We will repeat her thyroid ultrasound  3. DM2 -Managed by PCP -Reviewed HbA1c levels Lab Results  Component Value Date   HGBA1C 6.4 04/09/2019   HGBA1C 6.3 03/28/2018   HGBA1C 5.9 03/25/2017   Orders Placed This Encounter  Procedures  . US THYROID  . TSH  . T3, free  . T4, free  . Thyroid stimulating immunoglobulin   Thyroid U/S (10/23/2019): Parenchymal Echotexture: Mildly heterogenous Isthmus: Normal in size measuring 0.6 cm in diameter, unchanged Right lobe: Normal in size measuring 5.3 x 1.4 x 1.6 cm,  previously, 4.1 x 1.2 x 1.5 cm Left lobe: Enlarged measuring 6.9 x 3.9 x 2.4 cm, previously, 6.1 x 2.1 x 2.3 cm. ___________________________________________________________  The previously biopsied approximately 3.0 x 2.7 x 1.9 cm isoechoic ill-defined nodule within the mid/inferior  aspect of the left lobe of the thyroid is grossly unchanged in size compared to the 10/2017 examination, previously, 3.2 x 2.5 x 2.0 cm. Correlation with previous biopsy results is advised.  IMPRESSION: 1. Similar appearing mildly heterogeneous and borderline enlarged thyroid gland without worrisome new or enlarging thyroid nodule. 2. Previously biopsied nodule within the mid/inferior aspect the left lobe of the thyroid is grossly unchanged compared to the 10/2017 examination. Correlation with previous biopsy results is advised. Assuming a benign pathologic diagnosis, repeat sampling and/or continued dedicated follow-up is not recommend  The above is in keeping with the ACR TI-RADS recommendations - J Am Coll Radiol 2017;14:587-595.  Electronically Signed   By: Sandi Mariscal M.D.   On: 10/23/2019 16:05  Slightly enlarged gland but not due to enlargement of the thyroid nodule.  It is possible that this is due to more inflammation.  Will await the results of her labs.  Component     Latest Ref Rng & Units 11/03/2019  TSH     0.35 - 4.50 uIU/mL 1.25  T4,Free(Direct)     0.60 - 1.60 ng/dL 1.02  Triiodothyronine,Free,Serum     2.3 - 4.2 pg/mL 2.5  TSI     <140 % baseline 331 (H)   TFTs are normal and TSI's are still high, but improved.  Since the TSI's are still high, I would continue the low-dose methimazole for now.  Philemon Kingdom, MD PhD [facility] Endocrinology

## 2019-10-20 NOTE — Progress Notes (Signed)
Chief Complaint  Patient presents with  . Pain Management    History of Present Illness: HPI  74 year old female presents for chronic pain management.  Indication for chronic opioid:  On gabapentin, flexeril and Cymbalta for pain in back and knees. Using tylenol #3  Medication and dose:  Actually using 1-2 tab once day prn # pills per  3 month: 60 Last UDS date: 07/27/19 Opioid Treatment Agreement signed (Y/N): Y Opioid Treatment Agreement last reviewed with patient:  Y [facility] reviewed this encounter (include red flags):    No red flags 10/20/19  Last refill #60 10/13/19  Today back pain is tolerable...  No pain now.. took tylenol # 3 earlier when woke up with pain at 5 AM.   This visit occurred during the SARS-CoV-2 public health emergency.  Safety protocols were in place, including screening questions prior to the visit, additional usage of staff PPE, and extensive cleaning of exam room while observing appropriate contact time as indicated for disinfecting solutions.   COVID 19 screen:  No recent travel or known exposure to COVID19 The patient denies respiratory symptoms of COVID 19 at this time. The importance of social distancing was discussed today.     Review of Systems  Constitutional: Negative for chills and fever.  HENT: Negative for congestion and ear pain.   Eyes: Negative for pain and redness.  Respiratory: Negative for cough and shortness of breath.   Cardiovascular: Negative for chest pain, palpitations and leg swelling.  Gastrointestinal: Positive for constipation. Negative for abdominal pain, blood in stool, diarrhea, nausea and vomiting.       Bloating worsened lately.. plans to contact GI to disucss  Genitourinary: Negative for dysuria.  Musculoskeletal: Positive for back pain and joint pain. Negative for falls and myalgias.  Skin: Negative for rash.  Neurological: Negative for dizziness.  Psychiatric/Behavioral: Negative for depression. The patient is not  nervous/anxious.       Past Medical History:  Diagnosis Date  . Allergic rhinitis   . Allergy   . Arthritis   . Asthma   . Chronic headache   . Diabetes mellitus   . DVT (deep venous thrombosis) ([place])   . Dyspnea   . Heart murmur   . Hypertension   . Pulmonary embolism ([place])   . Pulmonary hypertension ([place])   . Seizures ([place])     reports that she has never smoked. She has never used smokeless tobacco. She reports previous alcohol use. She reports that she does not use drugs.   Current Outpatient Medications:  .  acetaminophen-codeine (TYLENOL #3) 300-30 MG tablet, TAKE 1 TABLET BY MOUTH EVERY 12 HOURS AS NEEDED FOR UP TO 7 DAYS FOR MODERATE PAIN, Disp: 60 tablet, Rfl: 0 .  albuterol (VENTOLIN HFA) 108 (90 Base) MCG/ACT inhaler, INHALE 2 PUFFS BY MOUTH INTO THE LUNGS EVERY 6 HOURS AS NEEDED FOR WHEEZING OR SHORTNESS OF BREATH, Disp: 54 g, Rfl: 0 .  amLODipine (NORVASC) 5 MG tablet, Add'l Sig Add'l Sig oral Add'l Sig, Disp: , Rfl:  .  Apoaequorin (PREVAGEN PO), Take 1 tablet by mouth daily., Disp: , Rfl:  .  azelastine (ASTELIN) 0.1 % nasal spray, USE 1-2 SPRAYS IN EACH NOSTRIL TWICE DAILY AS NEEDED, Disp: 30 mL, Rfl: 5 .  clobetasol cream (TEMOVATE) [id number] %, Apply 1 application topically 2 (two) times daily., Disp: , Rfl:  .  cyclobenzaprine (FLEXERIL) 10 MG tablet, Take 1 tablet (10 mg total) by mouth at bedtime as needed for muscle spasms.,  Disp: 30 tablet, Rfl: 1 .  diltiazem (CARTIA XT) 120 MG 24 hr capsule, Take 1 capsule (120 mg total) by mouth daily. KEEP OV., Disp: 30 capsule, Rfl: 2 .  Doxepin HCl POWD, Add'l Sig Add'l Sig miscellaneous Add'l Sig, Disp: , Rfl:  .  DULoxetine (CYMBALTA) 30 MG capsule, TAKE 1 CAPSULE(30 MG) BY MOUTH DAILY, Disp: 30 capsule, Rfl: 5 .  Empagliflozin-linaGLIPtin (GLYXAMBI) 10-5 MG TABS, Take 1 tablet by mouth daily., Disp: 30 tablet, Rfl: 11 .  EPINEPHrine 0.3 mg/0.3 mL IJ SOAJ injection, USE AS DIRECTED FOR LIFE THREATENING ALLERGIC REACTIONS,  Disp: 2 each, Rfl: 2 .  famotidine (PEPCID) 20 MG tablet, Take 1 tablet (20 mg total) by mouth 2 (two) times daily., Disp: 60 tablet, Rfl: 0 .  famotidine (PEPCID) 40 MG tablet, TAKE 1 TABLET(40 MG) BY MOUTH DAILY, Disp: 90 tablet, Rfl: 1 .  fluconazole (DIFLUCAN) 150 MG tablet, Take 1 tab po today, repeat in 7 days if needed, Disp: 2 tablet, Rfl: 0 .  fluticasone (FLOVENT HFA) 220 MCG/ACT inhaler, Inhale two puffs twice daily during flare-up as needed.  Rinse, gargle, and spit after use., Disp: 12 g, Rfl: 5 .  fluticasone-salmeterol (ADVAIR HFA) 230-21 MCG/ACT inhaler, Inhale two puffs twice daily to prevent cough or wheeze. Rinse mouth after use., Disp: 12 g, Rfl: 3 .  furosemide (LASIX) 40 MG tablet, TAKE 1 TABLET(40 MG) BY MOUTH DAILY, Disp: 30 tablet, Rfl: 3 .  gabapentin (NEURONTIN) 300 MG capsule, Add'l Sig Add'l Sig oral Add'l Sig, Disp: , Rfl:  .  gabapentin (NEURONTIN) 600 MG tablet, Take one at breakfast, one at lunch, 2 at bedtime, Disp: 360 tablet, Rfl: 1 .  hydrOXYzine (ATARAX/VISTARIL) 10 MG tablet, TK 1 TO 2 TS PO HS, Disp: , Rfl: 1 .  ipratropium (ATROVENT) 0.06 % nasal spray, Place 1-2 sprays each nostril 1-2 times per day, Disp: 15 mL, Rfl: 5 .  irbesartan (AVAPRO) 150 MG tablet, TAKE 1 TABLET(150 MG) BY MOUTH DAILY, Disp: 30 tablet, Rfl: 9 .  Lactase (DAIRY-RELIEF PO), Take 1 capsule by mouth daily as needed (stomach upset). , Disp: , Rfl:  .  levalbuterol (XOPENEX) 1.25 MG/3ML nebulizer solution, Take 1.25 mg by nebulization every 6 (six) hours as needed for wheezing or shortness of breath., Disp: 1080 mL, Rfl: 1 .  Magnesium 250 MG TABS, Take 1 tablet by mouth daily., Disp: , Rfl:  .  meclizine (ANTIVERT) 12.5 MG tablet, Take 1 tablet (12.5 mg total) by mouth 3 (three) times daily as needed for dizziness., Disp: 15 tablet, Rfl: 0 .  methimazole (TAPAZOLE) 5 MG tablet, TAKE 1 TABLET BY MOUTH IN THE MORNING AND 1/2 TABLET IN THE EVENING WITH MEALS, Disp: 60 tablet, Rfl: 1 .   montelukast (SINGULAIR) 10 MG tablet, TAKE 1 TABLET BY MOUTH EVERY DAY, Disp: 90 tablet, Rfl: 1 .  NONFORMULARY OR COMPOUNDED ITEM, [place] #11 Peripheral Neuropathy cream 5 refills  Sent in 09-29-19, Disp: , Rfl:  .  nystatin (MYCOSTATIN) 100000 UNIT/ML suspension, SWISH AND SWALLOW 5 ML BY MOUTH TWICE DAILY AFTER ADVAIR, Disp: 60 mL, Rfl: 5 .  ondansetron (ZOFRAN-ODT) 4 MG disintegrating tablet, DISSOLVE 1 TO 2 TABLETS ON THE TONGUE THEN SWALLOW EVERY 8 HOURS AS NEEDED FOR NAUSEA, Disp: , Rfl:  .  ONETOUCH DELICA LANCETS 99991111 MISC, as needed (blood check). , Disp: , Rfl:  .  ONETOUCH VERIO test strip, 1 each by Other route as needed (blood monitoring). , Disp: , Rfl:  .  oxybutynin (DITROPAN-XL) 10 MG 24 hr tablet, oxybutynin chloride ER 10 mg tablet,extended release 24 hr, Disp: , Rfl:  .  oxybutynin (DITROPAN-XL) 5 MG 24 hr tablet, Take 5 mg by mouth daily., Disp: , Rfl:  .  pantoprazole (PROTONIX) 40 MG tablet, TAKE 1 TABLET(40 MG) BY MOUTH DAILY, Disp: 90 tablet, Rfl: 1 .  PATADAY 0.2 % SOLN, INT 1 GTT IN OU QD PRN, Disp: , Rfl: 4 .  Potassium 99 MG TABS, Take 99 mg by mouth daily., Disp: , Rfl:  .  predniSONE (DELTASONE) 10 MG tablet, Take two tablets (20mg ) twice daily for three days, then one tablet (10mg ) twice daily for three days, then STOP., Disp: 18 tablet, Rfl: 0 .  predniSONE (DELTASONE) 5 MG tablet, prednisone 5 mg tablet  1 tab tid x 2 days, 1 tab bid x 5 days, 1 tab qd till finished, Disp: , Rfl:  .  PRESCRIPTION MEDICATION, BUP 1%/DOX 3%/GAB 6%/PEN 3%/TOP 1% 30 GM-Apply 1 to 2 pumps to effected are (foot) 3 to 4 times daily as needed., Disp: , Rfl:  .  Probiotic Product (ALIGN) 4 MG CAPS, Take 1 capsule by mouth daily., Disp: , Rfl:  .  Psyllium (METAMUCIL FIBER PO), Take 2 Scoops by mouth daily., Disp: , Rfl:  .  rivaroxaban (XARELTO) 20 MG TABS tablet, Take 1 tablet (20 mg total) by mouth daily with supper., Disp: 90 tablet, Rfl: 1 .  SUMAtriptan (IMITREX) 100 MG  tablet, Take 100 mg x 1 may, repeat in 2 hour for migraine., Disp: 10 tablet, Rfl: 0 .  Tiotropium Bromide Monohydrate (SPIRIVA RESPIMAT) 1.25 MCG/ACT AERS, Inhale 2 puffs into the lungs daily., Disp: 4 g, Rfl: 5 .  triamcinolone cream (KENALOG) 0.1 %, Apply 1 application topically 2 (two) times daily., Disp: 30 g, Rfl: 0 .  TRULANCE 3 MG TABS, Take 1 tablet by mouth daily. , Disp: , Rfl:  .  valsartan (DIOVAN) 160 MG tablet, Add'l Sig Add'l Sig oral Add'l Sig, Disp: , Rfl:   Current Facility-Administered Medications:  .  omalizumab Arvid Right) injection 300 mg, 300 mg, Subcutaneous, Q28 days, Kozlow, Donnamarie Poag, MD, 300 mg at 10/06/19 1113   Observations/Objective: Blood pressure 106/60, pulse 79, temperature 99.2 F (37.3 C), temperature source Temporal, height 5' 4.5" (1.638 m), weight 215 lb 8 oz (97.8 kg), SpO2 97 %. Wt Readings from Last 3 Encounters:  10/20/19 215 lb 8 oz (97.8 kg)  08/04/19 228 lb 2 oz (103.5 kg)  07/23/19 219 lb 3 oz (99.4 kg)    Physical Exam Constitutional:      General: She is not in acute distress.    Appearance: Normal appearance. She is well-developed. She is obese. She is not ill-appearing or toxic-appearing.  HENT:     Head: Normocephalic.     Right Ear: Hearing, tympanic membrane, ear canal and external ear normal. Tympanic membrane is not erythematous, retracted or bulging.     Left Ear: Hearing, tympanic membrane, ear canal and external ear normal. Tympanic membrane is not erythematous, retracted or bulging.     Nose: No mucosal edema or rhinorrhea.     Right Sinus: No maxillary sinus tenderness or frontal sinus tenderness.     Left Sinus: No maxillary sinus tenderness or frontal sinus tenderness.     Mouth/Throat:     Pharynx: Uvula midline.  Eyes:     General: Lids are normal. Lids are everted, no foreign bodies appreciated.     Conjunctiva/sclera: Conjunctivae normal.  Pupils: Pupils are equal, round, and reactive to light.  Neck:     Thyroid:  No thyroid mass or thyromegaly.     Vascular: No carotid bruit.     Trachea: Trachea normal.  Cardiovascular:     Rate and Rhythm: Normal rate and regular rhythm.     Pulses: Normal pulses.     Heart sounds: Normal heart sounds, S1 normal and S2 normal. No murmur. No friction rub. No gallop.   Pulmonary:     Effort: Pulmonary effort is normal. No tachypnea or respiratory distress.     Breath sounds: Normal breath sounds. No decreased breath sounds, wheezing, rhonchi or rales.  Abdominal:     General: Bowel sounds are normal.     Palpations: Abdomen is soft.     Tenderness: There is no abdominal tenderness.  Musculoskeletal:     Cervical back: Normal range of motion and neck supple.  Skin:    General: Skin is warm and dry.     Findings: No rash.  Neurological:     Mental Status: She is alert.  Psychiatric:        Mood and Affect: Mood is not anxious or depressed.        Speech: Speech normal.        Behavior: Behavior normal. Behavior is cooperative.        Thought Content: Thought content normal.        Judgment: Judgment normal.      Assessment and Plan   Chronic pain No red flags.. refilled tylenol # 3 on 10/13/19.Marland Kitchen will last 3 months.  Follow up in 3 months.   Can try diclofenac gel for knee pain from OA as well. Continue cymbalta, gabapentin, flexeril.     [name], MD

## 2019-10-20 NOTE — Patient Instructions (Addendum)
Please continue: - Methimazole 5 mg every other day with a meal  Please come back for labs in at least 1 week after you finish the Prednisone.  Please come back for a follow-up appointment in 6 months.

## 2019-10-20 NOTE — Telephone Encounter (Signed)
Refills sent as requested

## 2019-10-20 NOTE — Telephone Encounter (Signed)
Pt seen today with Dr Diona Browner  - forgot to ask about [employer] supplies refills

## 2019-10-20 NOTE — Assessment & Plan Note (Signed)
No red flags.. refilled tylenol # 3 on 10/13/19.Marland Kitchen will last 3 months.  Follow up in 3 months.   Can try diclofenac gel for knee pain from OA as well. Continue cymbalta, gabapentin, flexeril.

## 2019-10-20 NOTE — Patient Instructions (Addendum)
Can try voltaren/diclofenac gel on knees for arthritis pain. Keep up great work on weight loss and exercise as tolerated.

## 2019-10-23 ENCOUNTER — Ambulatory Visit
Admission: RE | Admit: 2019-10-23 | Discharge: 2019-10-23 | Disposition: A | Discharge status: Home / Self Care | Payer: Medicare Other | Source: Ambulatory Visit | Attending: Internal Medicine | Admitting: Internal Medicine

## 2019-10-23 ENCOUNTER — Other Ambulatory Visit: Payer: Self-pay

## 2019-10-23 DIAGNOSIS — E041 Nontoxic single thyroid nodule: Secondary | ICD-10-CM | POA: Diagnosis not present

## 2019-10-26 ENCOUNTER — Telehealth: Payer: Self-pay

## 2019-10-26 NOTE — Telephone Encounter (Signed)
-----   Message from Philemon Kingdom, MD sent at 10/23/2019  4:42 PM EDT ----- Lenna Sciara, can you please call pt:  Her thyroid ultrasound results are back: Thyroid is slightly enlarged compared to before but not due to enlargement of the thyroid nodule.  This appears to be stable.  We biopsied this with benign results in 2019, so no intervention is needed for now.  It is possible that this is due to more inflammation due to Graves' disease.  Will await the results of her labs.

## 2019-10-27 NOTE — Telephone Encounter (Signed)
Notified patient of message from Dr. Gherghe, patient expressed understanding and agreement. No further questions.  

## 2019-10-28 DIAGNOSIS — M1711 Unilateral primary osteoarthritis, right knee: Secondary | ICD-10-CM | POA: Diagnosis not present

## 2019-10-28 DIAGNOSIS — M545 Low back pain: Secondary | ICD-10-CM | POA: Diagnosis not present

## 2019-10-28 DIAGNOSIS — M5136 Other intervertebral disc degeneration, lumbar region: Secondary | ICD-10-CM | POA: Diagnosis not present

## 2019-10-30 ENCOUNTER — Telehealth: Payer: Self-pay

## 2019-10-30 ENCOUNTER — Other Ambulatory Visit: Payer: Medicare Other

## 2019-10-30 DIAGNOSIS — I10 Essential (primary) hypertension: Secondary | ICD-10-CM

## 2019-10-30 DIAGNOSIS — K219 Gastro-esophageal reflux disease without esophagitis: Secondary | ICD-10-CM

## 2019-10-30 NOTE — Telephone Encounter (Signed)
Please sign referral

## 2019-10-30 NOTE — Telephone Encounter (Signed)
Per written referral from PCP, requesting referral in Epic for [name] to chronic care management pharmacy services for the following conditions:   Essential hypertension, benign  [I10]  GERD [K21.9]  Debbora Dus, PharmD Clinical Pharmacist [facility] [phone]

## 2019-11-02 ENCOUNTER — Telehealth: Payer: Self-pay

## 2019-11-02 ENCOUNTER — Other Ambulatory Visit: Payer: Self-pay

## 2019-11-02 ENCOUNTER — Ambulatory Visit: Payer: Medicare Other

## 2019-11-02 DIAGNOSIS — G4733 Obstructive sleep apnea (adult) (pediatric): Secondary | ICD-10-CM

## 2019-11-02 DIAGNOSIS — K219 Gastro-esophageal reflux disease without esophagitis: Secondary | ICD-10-CM

## 2019-11-02 DIAGNOSIS — I10 Essential (primary) hypertension: Secondary | ICD-10-CM

## 2019-11-02 DIAGNOSIS — G894 Chronic pain syndrome: Secondary | ICD-10-CM

## 2019-11-02 DIAGNOSIS — I480 Paroxysmal atrial fibrillation: Secondary | ICD-10-CM

## 2019-11-02 DIAGNOSIS — J309 Allergic rhinitis, unspecified: Secondary | ICD-10-CM

## 2019-11-02 DIAGNOSIS — J4540 Moderate persistent asthma, uncomplicated: Secondary | ICD-10-CM

## 2019-11-02 DIAGNOSIS — G4701 Insomnia due to medical condition: Secondary | ICD-10-CM

## 2019-11-02 DIAGNOSIS — F325 Major depressive disorder, single episode, in full remission: Secondary | ICD-10-CM

## 2019-11-02 DIAGNOSIS — E1340 Other specified diabetes mellitus with diabetic neuropathy, unspecified: Secondary | ICD-10-CM

## 2019-11-02 DIAGNOSIS — E0500 Thyrotoxicosis with diffuse goiter without thyrotoxic crisis or storm: Secondary | ICD-10-CM

## 2019-11-02 DIAGNOSIS — M1711 Unilateral primary osteoarthritis, right knee: Secondary | ICD-10-CM

## 2019-11-02 DIAGNOSIS — G8929 Other chronic pain: Secondary | ICD-10-CM

## 2019-11-02 DIAGNOSIS — E1140 Type 2 diabetes mellitus with diabetic neuropathy, unspecified: Secondary | ICD-10-CM

## 2019-11-02 DIAGNOSIS — I82501 Chronic embolism and thrombosis of unspecified deep veins of right lower extremity: Secondary | ICD-10-CM

## 2019-11-02 NOTE — Patient Instructions (Addendum)
November 02, 2019  Dear [name],  It was a pleasure meeting you during our initial appointment on November 02, 2019. Below is a summary of the goals we discussed and components of chronic care management. Please contact me anytime with questions or concerns.   Visit Information  Goals Addressed            This Visit's Progress   . Pharmacy Care Plan: Diabetes       CARE PLAN ENTRY  Current Barriers:  . Diabetes: controlled; complicated by chronic medical conditions including high blood pressure Lab Results  Component Value Date   HGBA1C 6.4 04/09/2019 .   Lab Results  Component Value Date   CREATININE 1.00 04/13/2019   CREATININE 0.94 04/09/2019   CREATININE 1.12 03/28/2018   . Current antihyperglycemic regimen: Empagliflozin-linagliptin (Glyxambi) 10 mg-5 mg - 1 tablet daily in the morning  . Denies hypoglycemic symptoms, including dizziness, lightheadedness, shaking, sweating . Current exercise: occasional use of stationary bike or foot pedaling system . Current blood glucose readings: Fasting BG: 120s  . Cardiovascular risk reduction: o Current hypertensive regimen: irbesartan 150 mg daily o Current hyperlipidemia regimen: no pharmacotherapy o Current antiplatelet regimen: no pharmacotherapy, on Xarelto  Pharmacist Clinical Goal(s):  Marland Kitchen Over the next  30 days, patient will work with PharmD and primary care provider to address need for cholesterol-lowering therapy to reduce cardiovascular risk.  Interventions: . Comprehensive medication review performed, medication list updated in electronic medical record . Inter-disciplinary care team collaboration: consult with Dr. Diona Browner regarding statin therapy  Patient Self Care Activities:  . Patient will check blood glucose weekly before breakfast, document, and provide at future appointments . Patient will focus on medication adherence by continuing to use pillbox  . Patient will take medications as prescribed . Patient  will contact provider with any episodes of hypoglycemia . Patient will report any questions or concerns to provider   Initial goal documentation    . Pharmacy Care Plan: General       CARE PLAN ENTRY  Current Barriers:  . Chronic Disease Management support, education, and care coordination needs related to COPD  Pharmacist Clinical Goal(s):  Marland Kitchen Over the next 30 days, patient will work with PharmD and primary care provider to address the following goals: o COPD: Ensure accuracy of medication dosing, upon review of chart, your maintenance inhalers should be taken as follows until next pulmonology appt: - Advair 2 puffs every morning with Spiriva 2 puffs every morning. Continue Nystatin after each use of Advair.  o Insomnia: Improve sleep duration and time to sleep onset with healthy sleep habits o Acid reflux: Adjust timing of medication for better symptom control o Vaccinations: Recommend 2-dose series of shingles vaccine (Shingrix). o Pain: Improve back pain with scheduling of medications.   Interventions: . Comprehensive medication review performed  Patient Self Care Activities:  For the next 30 days until follow up visit:  . Change Advair and Spiriva doing to the above recommendations per pulmonology. . Begin taking pantoprazole 30 minutes to an hour before breakfast and famotidine at bedtime.  . Try taking Tylenol #3 a maximum of twice daily and scheduling Tylenol 500 mg 1 tablet twice daily alternating with Tylenol #3. Continue Voltaren gel 1% up to four times daily, using Salonpas for any breakthrough pain. . Incorporate healthy sleep habits including avoiding caffeine 4-5 hours prior to bed, avoiding screens/television in the bedroom, use the bed only for sleep, if unable to fall asleep within 30 minutes,  get out of bed and read or do an activity that will promote sleep. Avoid clock watching or lying in bed for extended periods of time as this can provoke anxiety.   Initial goal  documentation    . Pharmacy Care Plan: Hypertension       CARE PLAN ENTRY  Current Barriers:  . Controlled hypertension, complicated by diabetes . Current antihypertensive regimen:   Irbesartan 150 mg - 1 tablet daily in the morning  Furosemide 40 mg - 1 tablet daily as needed   Potassium 99 mg - 1 tablet daily in the evening . Previous antihypertensives tried: multiple (valsartan, diltiazem, amlodipine) . Last practice recorded BP readings:  BP Readings from Last 3 Encounters:  10/20/19 140/80  10/20/19 106/60  07/21/19 124/82   . Current home BP readings: none, patient is not checking routinely at home  Pharmacist Clinical Goal(s):  Marland Kitchen Over the next 30 days, patient will work with PharmD and providers on the following goals: o Ensuring current medications are accurate  o Work towards weight loss goal of less than 200 lbs with current weight at 216 lbs. by increasing exercise and making dietary changes  Interventions: . Inter-disciplinary care team collaboration: review current medications with PCP and cardiologist to confirm accuracy . Comprehensive medication review performed; medication list updated in the electronic medical record  Patient Self Care Activities: . Patient will continue to check BP if symptomatic, document, and provide at future appointments . Patient will focus on medication adherence by continuing to use pillbox and updating home medication list . Patient will increase exercise with bike/foot pedaling to achieve goal of 10 minutes per day over the next 4 weeks . Patient will make one step towards healthier diet by reviewing handout on heart healthy diet over the next 4 weeks  Initial goal documentation       [name] was given information about Chronic Care Management services today including:  1. CCM service includes personalized support from designated clinical staff supervised by [name] physician, including individualized plan of care and  coordination with other care providers 2. 24/7 contact phone numbers for assistance for urgent and routine care needs. 3. Standard insurance, coinsurance, copays and deductibles apply for chronic care management only during months in which we provide at least 20 minutes of these services. Most insurances cover these services at 100%, however patients may be responsible for any copay, coinsurance and/or deductible if applicable. This service may help you avoid the need for more expensive face-to-face services. 4. Only one practitioner may furnish and bill the service in a calendar month. 5. The patient may stop CCM services at any time (effective at the end of the month) by phone call to the office staff.  Patient agreed to services and verbal consent obtained.   The patient verbalized understanding of instructions provided today and agreed to receive a mailed copy of patient instruction and/or educational materials. Telephone follow up appointment with pharmacy team member scheduled for:  12/02/19 at 11:00 AM (telephone)  Debbora Dus, PharmD Clinical Pharmacist [facility] [phone]   Calorie Counting for Weight Loss Calories are units of energy. Your body needs a certain amount of calories from food to keep you going throughout the day. When you eat more calories than your body needs, your body stores the extra calories as fat. When you eat fewer calories than your body needs, your body burns fat to get the energy it needs. Calorie counting means keeping track of  how many calories you eat and drink each day. Calorie counting can be helpful if you need to lose weight. If you make sure to eat fewer calories than your body needs, you should lose weight. Ask your health care provider what a healthy weight is for you. For calorie counting to work, you will need to eat the right number of calories in a day in order to lose a healthy amount of weight per week. A dietitian  can help you determine how many calories you need in a day and will give you suggestions on how to reach your calorie goal. A healthy amount of weight to lose per week is usually 1-2 lb (0.5-0.9 kg). This usually means that your daily calorie intake should be reduced by 500-750 calories. Eating 1,200 - 1,500 calories per day can help most women lose weight. Eating 1,500 - 1,800 calories per day can help most men lose weight. What is my plan? My goal is to have __________ calories per day. If I have this many calories per day, I should lose around __________ pounds per week. What do I need to know about calorie counting? In order to meet your daily calorie goal, you will need to: Find out how many calories are in each food you would like to eat. Try to do this before you eat. Decide how much of the food you plan to eat. Write down what you ate and how many calories it had. Doing this is called keeping a food log. To successfully lose weight, it is important to balance calorie counting with a healthy lifestyle that includes regular activity. Aim for 150 minutes of moderate exercise (such as walking) or 75 minutes of vigorous exercise (such as running) each week. Where do I find calorie information?  The number of calories in a food can be found on a Nutrition Facts label. If a food does not have a Nutrition Facts label, try to look up the calories online or ask your dietitian for help. Remember that calories are listed per serving. If you choose to have more than one serving of a food, you will have to multiply the calories per serving by the amount of servings you plan to eat. For example, the label on a package of bread might say that a serving size is 1 slice and that there are 90 calories in a serving. If you eat 1 slice, you will have eaten 90 calories. If you eat 2 slices, you will have eaten 180 calories. How do I keep a food log? Immediately after each meal, record the following information  in your food log: What you ate. Don't forget to include toppings, sauces, and other extras on the food. How much you ate. This can be measured in cups, ounces, or number of items. How many calories each food and drink had. The total number of calories in the meal. Keep your food log near you, such as in a small notebook in your pocket, or use a mobile app or website. Some programs will calculate calories for you and show you how many calories you have left for the day to meet your goal. What are some calorie counting tips?  Use your calories on foods and drinks that will fill you up and not leave you hungry: Some examples of foods that fill you up are nuts and nut butters, vegetables, lean proteins, and high-fiber foods like whole grains. High-fiber foods are foods with more than 5 g fiber per serving. Drinks  such as sodas, specialty coffee drinks, alcohol, and juices have a lot of calories, yet do not fill you up. Eat nutritious foods and avoid empty calories. Empty calories are calories you get from foods or beverages that do not have many vitamins or protein, such as candy, sweets, and soda. It is better to have a nutritious high-calorie food (such as an avocado) than a food with few nutrients (such as a bag of chips). Know how many calories are in the foods you eat most often. This will help you calculate calorie counts faster. Pay attention to calories in drinks. Low-calorie drinks include water and unsweetened drinks. Pay attention to nutrition labels for "low fat" or "fat free" foods. These foods sometimes have the same amount of calories or more calories than the full fat versions. They also often have added sugar, starch, or salt, to make up for flavor that was removed with the fat. Find a way of tracking calories that works for you. Get creative. Try different apps or programs if writing down calories does not work for you. What are some portion control tips? Know how many calories are in  a serving. This will help you know how many servings of a certain food you can have. Use a measuring cup to measure serving sizes. You could also try weighing out portions on a kitchen scale. With time, you will be able to estimate serving sizes for some foods. Take some time to put servings of different foods on your favorite plates, bowls, and cups so you know what a serving looks like. Try not to eat straight from a bag or box. Doing this can lead to overeating. Put the amount you would like to eat in a cup or on a plate to make sure you are eating the right portion. Use smaller plates, glasses, and bowls to prevent overeating. Try not to multitask (for example, watch TV or use your computer) while eating. If it is time to eat, sit down at a table and enjoy your food. This will help you to know when you are full. It will also help you to be aware of what you are eating and how much you are eating. What are tips for following this plan? Reading food labels Check the calorie count compared to the serving size. The serving size may be smaller than what you are used to eating. Check the source of the calories. Make sure the food you are eating is high in vitamins and protein and low in saturated and trans fats. Shopping Read nutrition labels while you shop. This will help you make healthy decisions before you decide to purchase your food. Make a grocery list and stick to it. Cooking Try to cook your favorite foods in a healthier way. For example, try baking instead of frying. Use low-fat dairy products. Meal planning Use more fruits and vegetables. Half of your plate should be fruits and vegetables. Include lean proteins like poultry and fish. How do I count calories when eating out? Ask for smaller portion sizes. Consider sharing an entree and sides instead of getting your own entree. If you get your own entree, eat only half. Ask for a box at the beginning of your meal and put the rest of your  entree in it so you are not tempted to eat it. If calories are listed on the menu, choose the lower calorie options. Choose dishes that include vegetables, fruits, whole grains, low-fat dairy products, and lean protein. Choose items that  are boiled, broiled, grilled, or steamed. Stay away from items that are buttered, battered, fried, or served with cream sauce. Items labeled "crispy" are usually fried, unless stated otherwise. Choose water, low-fat milk, unsweetened iced tea, or other drinks without added sugar. If you want an alcoholic beverage, choose a lower calorie option such as a glass of wine or light beer. Ask for dressings, sauces, and syrups on the side. These are usually high in calories, so you should limit the amount you eat. If you want a salad, choose a garden salad and ask for grilled meats. Avoid extra toppings like bacon, cheese, or fried items. Ask for the dressing on the side, or ask for olive oil and vinegar or lemon to use as dressing. Estimate how many servings of a food you are given. For example, a serving of cooked rice is  cup or about the size of half a baseball. Knowing serving sizes will help you be aware of how much food you are eating at restaurants. The list below tells you how big or small some common portion sizes are based on everyday objects: 1 oz--4 stacked dice. 3 oz--1 deck of cards. 1 tsp--1 die. 1 Tbsp-- a ping-pong ball. 2 Tbsp--1 ping-pong ball.  cup-- baseball. 1 cup--1 baseball. Summary Calorie counting means keeping track of how many calories you eat and drink each day. If you eat fewer calories than your body needs, you should lose weight. A healthy amount of weight to lose per week is usually 1-2 lb (0.5-0.9 kg). This usually means reducing your daily calorie intake by 500-750 calories. The number of calories in a food can be found on a Nutrition Facts label. If a food does not have a Nutrition Facts label, try to look up the calories online or  ask your dietitian for help. Use your calories on foods and drinks that will fill you up, and not on foods and drinks that will leave you hungry. Use smaller plates, glasses, and bowls to prevent overeating. This information is not intended to replace advice given to you by your health care provider. Make sure you discuss any questions you have with your health care provider. Document Revised: 03/21/2018 Document Reviewed: 06/01/2016 Elsevier Patient Education  [place].

## 2019-11-02 NOTE — Chronic Care Management (AMB) (Signed)
Chronic Care Management Pharmacy  Name: [name]  MRN: [id number] DOB: September 24, 1945  Chief Complaint/ HPI  [name],  74 y.o., female presents for their Initial CCM visit with the clinical pharmacist via telephone.  PCP : [name], MD  Their chronic conditions include: hypertension, afib, migraine headache, asthma, allergic rhinitis, sleep apnea, GERD, diabetes, neuropathy, Graves disease, osteoarthritis, depression, chronic pain, urge incontinence, high cholesterol   Patient concerns: back pain, MRI scheduled soon  Office Visits:  10/20/19: Diona Browner - pain management, on Tylenol #3 1-2 tabs daily, refill, may try diclofenac gel for knee pain, rtc 3 months   10/12/19: Telephone - fluconazole for yeast infection  08/12/19: Telephone - continue pantoprazole 40 mg daily, start Pepcid AC BID  08/04/19: Bedsole - Hoarseness, most likely due to GERD, add Pepcid BID to PPI   07/23/19: Bedsole - Try Pepcid AC for GERD  Consult Visit:  10/20/19: Endocrinology - thyrotoxicosis/thyroidnodule, decreased methimazole to 5 mg every other day with meal 09/2018, continues on diltiazem CD, rtc 6 months  10/06/19: Immunotherapy - asthma, Xolair injection  09/29/19: Podiatry - foot exam/debridement  08/10/19: Telephone (pulmonology) - question regarding 2 very similar inhalers and thrush, may try Advair once daily along with Spiriva   07/21/19: Pulmonology - thrush - continue Advair 2 puffs BID, start Nystatin after Advair BID, continue Spiriva 2 puffs daily, montelukast, Xolair, and Epi-Pen, PRN - saline spray, Mucinex DM BID, azelastine nasal spray BID, ipratropium nasal spray- BID PRN, albuterol nebulizer or inhaler PRN  - add Flovent 220 - 2 puffs BID for flare ups   04/23/19: Cardiology - cont current meds  Allergies  Allergen Reactions  . Latex Hives  . Penicillins Swelling    Has patient had a PCN reaction causing immediate rash, facial/tongue/throat swelling, SOB or  lightheadedness with hypotension patient had a PCN reaction causing severe rash involving mucus membranes or skin necrosis: [id number] Has patient had a PCN reaction that required hospitalization/No Has patient had a PCN reaction occurring within the last 10 years: No If all of the above answers are "NO", then may proceed with Cephalosporin use.     [name] [Pregabalin] Other (See Comments)    Blurred vision.  Marland Kitchen Phenytoin Sodium Extended Swelling    Swelling   . Vicodin [Hydrocodone-Acetaminophen] Nausea And Vomiting   Medications: Outpatient Encounter Medications as of 11/02/2019  Medication Sig  . acetaminophen-codeine (TYLENOL #3) 300-30 MG tablet TAKE 1 TABLET BY MOUTH EVERY 12 HOURS AS NEEDED FOR UP TO 7 DAYS FOR MODERATE PAIN  . albuterol (VENTOLIN HFA) 108 (90 Base) MCG/ACT inhaler INHALE 2 PUFFS BY MOUTH INTO THE LUNGS EVERY 6 HOURS AS NEEDED FOR WHEEZING OR SHORTNESS OF BREATH  . amLODipine (NORVASC) 5 MG tablet Add'l Sig Add'l Sig oral Add'l Sig  . Apoaequorin (PREVAGEN PO) Take 1 tablet by mouth daily.  Marland Kitchen azelastine (ASTELIN) 0.1 % nasal spray USE 1-2 SPRAYS IN EACH NOSTRIL TWICE DAILY AS NEEDED  . clobetasol cream (TEMOVATE) [id number] % Apply 1 application topically 2 (two) times daily.  . cyclobenzaprine (FLEXERIL) 10 MG tablet Take 1 tablet (10 mg total) by mouth at bedtime as needed for muscle spasms.  Marland Kitchen diltiazem (CARTIA XT) 120 MG 24 hr capsule Take 1 capsule (120 mg total) by mouth daily. KEEP OV.  Marland Kitchen Doxepin HCl POWD Add'l Sig Add'l Sig miscellaneous Add'l Sig  . DULoxetine (CYMBALTA) 30 MG capsule TAKE 1 CAPSULE(30 MG) BY MOUTH DAILY  . Empagliflozin-linaGLIPtin (GLYXAMBI) 10-5 MG TABS  Take 1 tablet by mouth daily.  Marland Kitchen EPINEPHrine 0.3 mg/0.3 mL IJ SOAJ injection USE AS DIRECTED FOR LIFE THREATENING ALLERGIC REACTIONS  . famotidine (PEPCID) 20 MG tablet Take 1 tablet (20 mg total) by mouth 2 (two) times daily.  . famotidine (PEPCID) 40 MG tablet TAKE 1 TABLET(40 MG) BY  MOUTH DAILY  . fluconazole (DIFLUCAN) 150 MG tablet Take 1 tab po today, repeat in 7 days if needed  . fluticasone (FLOVENT HFA) 220 MCG/ACT inhaler Inhale two puffs twice daily during flare-up as needed.  Rinse, gargle, and spit after use.  . fluticasone-salmeterol (ADVAIR HFA) 230-21 MCG/ACT inhaler Inhale two puffs twice daily to prevent cough or wheeze. Rinse mouth after use.  . furosemide (LASIX) 40 MG tablet TAKE 1 TABLET(40 MG) BY MOUTH DAILY  . gabapentin (NEURONTIN) 300 MG capsule Add'l Sig Add'l Sig oral Add'l Sig  . gabapentin (NEURONTIN) 600 MG tablet Take one at breakfast, one at lunch, 2 at bedtime  . hydrOXYzine (ATARAX/VISTARIL) 10 MG tablet TK 1 TO 2 TS PO HS  . ipratropium (ATROVENT) 0.06 % nasal spray Place 1-2 sprays each nostril 1-2 times per day  . irbesartan (AVAPRO) 150 MG tablet TAKE 1 TABLET(150 MG) BY MOUTH DAILY  . Lactase (DAIRY-RELIEF PO) Take 1 capsule by mouth daily as needed (stomach upset).   Marland Kitchen levalbuterol (XOPENEX) 1.25 MG/3ML nebulizer solution Take 1.25 mg by nebulization every 6 (six) hours as needed for wheezing or shortness of breath.  . Magnesium 250 MG TABS Take 1 tablet by mouth daily.  . meclizine (ANTIVERT) 12.5 MG tablet Take 1 tablet (12.5 mg total) by mouth 3 (three) times daily as needed for dizziness.  . methimazole (TAPAZOLE) 5 MG tablet TAKE 1 TABLET BY MOUTH IN THE MORNING AND 1/2 TABLET IN THE EVENING WITH MEALS  . montelukast (SINGULAIR) 10 MG tablet TAKE 1 TABLET BY MOUTH EVERY DAY  . NONFORMULARY OR COMPOUNDED [place] #11 Peripheral Neuropathy cream 5 refills  Sent in 09-29-19  . nystatin (MYCOSTATIN) 100000 UNIT/ML suspension SWISH AND SWALLOW 5 ML BY MOUTH TWICE DAILY AFTER ADVAIR  . ondansetron (ZOFRAN-ODT) 4 MG disintegrating tablet DISSOLVE 1 TO 2 TABLETS ON THE TONGUE THEN SWALLOW EVERY 8 HOURS AS NEEDED FOR NAUSEA  . OneTouch Delica Lancets 99991111 MISC Use to check blood sugar up to 2 times a day.  [name] VERIO  test strip Use to check blood sugar up to 2 times a day  . oxybutynin (DITROPAN-XL) 10 MG 24 hr tablet oxybutynin chloride ER 10 mg tablet,extended release 24 hr  . oxybutynin (DITROPAN-XL) 5 MG 24 hr tablet Take 5 mg by mouth daily.  . pantoprazole (PROTONIX) 40 MG tablet TAKE 1 TABLET(40 MG) BY MOUTH DAILY  . PATADAY 0.2 % SOLN INT 1 GTT IN OU QD PRN  . Potassium 99 MG TABS Take 99 mg by mouth daily.  . predniSONE (DELTASONE) 10 MG tablet Take two tablets (20mg ) twice daily for three days, then one tablet (10mg ) twice daily for three days, then STOP.  Marland Kitchen predniSONE (DELTASONE) 5 MG tablet prednisone 5 mg tablet  1 tab tid x 2 days, 1 tab bid x 5 days, 1 tab qd till finished  . PRESCRIPTION MEDICATION BUP 1%/DOX 3%/GAB 6%/PEN 3%/TOP 1% 30 GM-Apply 1 to 2 pumps to effected are (foot) 3 to 4 times daily as needed.  . Probiotic Product (ALIGN) 4 MG CAPS Take 1 capsule by mouth daily.  . Psyllium (METAMUCIL FIBER PO) Take 2 Scoops by  mouth daily.  . rivaroxaban (XARELTO) 20 MG TABS tablet Take 1 tablet (20 mg total) by mouth daily with supper.  . SUMAtriptan (IMITREX) 100 MG tablet Take 100 mg x 1 may, repeat in 2 hour for migraine.  . Tiotropium Bromide Monohydrate (SPIRIVA RESPIMAT) 1.25 MCG/ACT AERS Inhale 2 puffs into the lungs daily.  Marland Kitchen triamcinolone cream (KENALOG) 0.1 % Apply 1 application topically 2 (two) times daily.  . TRULANCE 3 MG TABS Take 1 tablet by mouth daily.   . valsartan (DIOVAN) 160 MG tablet Add'l Sig Add'l Sig oral Add'l Sig   Facility-Administered Encounter Medications as of 11/02/2019  Medication  . omalizumab Arvid Right) injection 300 mg   Current Diagnosis/Assessment:   Financial Resource Strain: Low Risk   . Difficulty of Paying Living Expenses: Not hard at all    Goals    . Increase water intake     Starting 03/28/2018, I will continue to drink at least 6-8 glasses of water daily.     Marland Kitchen Pharmacy Care Plan: Diabetes     CARE PLAN ENTRY  Current Barriers:   . Diabetes: controlled; complicated by chronic medical conditions including high blood pressure Lab Results  Component Value Date   HGBA1C 6.4 04/09/2019 .   Lab Results  Component Value Date   CREATININE 1.00 04/13/2019   CREATININE 0.94 04/09/2019   CREATININE 1.12 03/28/2018   . Current antihyperglycemic regimen: Empagliflozin-linagliptin (Glyxambi) 10 mg-5 mg - 1 tablet daily in the morning  . Denies hypoglycemic symptoms, including dizziness, lightheadedness, shaking, sweating . Current exercise: occasional use of stationary bike or foot pedaling system . Current blood glucose readings: Fasting BG: 120s  . Cardiovascular risk reduction: o Current hypertensive regimen: irbesartan 150 mg daily o Current hyperlipidemia regimen: no pharmacotherapy o Current antiplatelet regimen: no pharmacotherapy, on Xarelto  Pharmacist Clinical Goal(s):  Marland Kitchen Over the next  30 days, patient will work with PharmD and primary care provider to address need for cholesterol-lowering therapy to reduce cardiovascular risk.  Interventions: . Comprehensive medication review performed, medication list updated in electronic medical record . Inter-disciplinary care team collaboration: consult with Dr. Diona Browner regarding statin therapy  Patient Self Care Activities:  . Patient will check blood glucose weekly before breakfast, document, and provide at future appointments . Patient will focus on medication adherence by continuing to use pillbox  . Patient will take medications as prescribed . Patient will contact provider with any episodes of hypoglycemia . Patient will report any questions or concerns to provider   Initial goal documentation    . Pharmacy Care Plan: General     CARE PLAN ENTRY  Current Barriers:  . Chronic Disease Management support, education, and care coordination needs related to COPD  Pharmacist Clinical Goal(s):  Marland Kitchen Over the next 30 days, patient will work with PharmD and primary  care provider to address the following goals: o COPD: Ensure accuracy of medication dosing, upon review of chart, your maintenance inhalers should be taken as follows until next pulmonology appt: - Advair 2 puffs every morning with Spiriva 2 puffs every morning. Continue Nystatin after each use of Advair.  o Insomnia: Improve sleep duration and time to sleep onset with healthy sleep habits o Acid reflux: Adjust timing of medication for better symptom control o Vaccinations: Recommend 2-dose series of shingles vaccine (Shingrix). o Pain: Improve back pain with scheduling of medications.   Interventions: . Comprehensive medication review performed  Patient Self Care Activities:  For the next 30 days until follow up  visit:  . Change Advair and Spiriva doing to the above recommendations per pulmonology. . Begin taking pantoprazole 30 minutes to an hour before breakfast and famotidine at bedtime.  . Try taking Tylenol #3 a maximum of twice daily and scheduling Tylenol 500 mg 1 tablet twice daily alternating with Tylenol #3. Continue Voltaren gel 1% up to four times daily, using Salonpas for any breakthrough pain. . Incorporate healthy sleep habits including avoiding caffeine 4-5 hours prior to bed, avoiding screens/television in the bedroom, use the bed only for sleep, if unable to fall asleep within 30 minutes, get out of bed and read or do an activity that will promote sleep. Avoid clock watching or lying in bed for extended periods of time as this can provoke anxiety.   Initial goal documentation    . Pharmacy Care Plan: Hypertension     CARE PLAN ENTRY  Current Barriers:  . Controlled hypertension, complicated by diabetes . Current antihypertensive regimen:   Irbesartan 150 mg - 1 tablet daily in the morning  Furosemide 40 mg - 1 tablet daily as needed   Potassium 99 mg - 1 tablet daily in the evening . Previous antihypertensives tried: multiple (valsartan, diltiazem,  amlodipine) . Last practice recorded BP readings:  BP Readings from Last 3 Encounters:  10/20/19 140/80  10/20/19 106/60  07/21/19 124/82   . Current home BP readings: none, patient is not checking routinely at home  Pharmacist Clinical Goal(s):  Marland Kitchen Over the next 30 days, patient will work with PharmD and providers on the following goals: o Ensuring current medications are accurate  o Work towards weight loss goal of less than 200 lbs with current weight at 216 lbs. by increasing exercise and making dietary changes  Interventions: . Inter-disciplinary care team collaboration: review current medications with PCP and cardiologist to confirm accuracy . Comprehensive medication review performed; medication list updated in the electronic medical record  Patient Self Care Activities: . Patient will continue to check BP if symptomatic, document, and provide at future appointments . Patient will focus on medication adherence by continuing to use pillbox and updating home medication list . Patient will increase exercise with bike/foot pedaling to achieve goal of 10 minutes per day over the next 4 weeks . Patient will make one step towards healthier diet by reviewing handout on heart healthy diet over the next 4 weeks  Initial goal documentation      Hypertension   CMP Latest Ref Rng & Units 04/13/2019 04/09/2019 03/28/2018  Glucose 70 - 99 mg/dL 111(H) 111(H) 127(H)  BUN 8 - 23 mg/dL 12 10 13   Creatinine 0.44 - 1.00 mg/dL 1.00 0.94 1.12  Sodium 135 - 145 mmol/L 143 143 139  Potassium 3.5 - 5.1 mmol/L 4.5 4.4 4.5  Chloride 98 - 111 mmol/L 106 107 103  CO2 22 - 32 mmol/L 26 28 25   Calcium 8.9 - 10.3 mg/dL 9.2 9.4 9.3  Total Protein 6.0 - 8.3 g/dL - 6.6 7.1  Total Bilirubin 0.2 - 1.2 mg/dL - 0.6 0.7  Alkaline Phos 39 - 117 U/L - 103 82  AST 0 - 37 U/L - 13 13  ALT 0 - 35 U/L - 7 12   Office blood pressures are: BP Readings from Last 3 Encounters:  10/20/19 140/80  10/20/19 106/60   07/21/19 124/82   BP goal < 140/90 mmHg Patient has failed these meds in the past: none  Patient checks BP at home: none Patient home BP readings are ranging: none  reported  Patient is currently controlled on the following medications:   Amlodipine 5 mg - 1 tablet daily (not taking)  Diltiazem 120 mg - 1 capsule daily (not taking)  Valsartan 160 mg - unknown (not taking)  Irbesartan 150 mg - 1 tablet daily (AM)  Furosemide 40 mg - 1 tablet daily as needed   Potassium 99 mg - 1 tablet daily (PM)  We discussed: has not taken furosemide in a long time, changed to PRN due to urge incontinence, denies swelling today, does not check weight daily  Weight loss: Current weight: 216 lbs (down from 250 lbs) Goal weight: < 200 lbs  Exercise: unable to walk due to energy, starting some cycling/foot pedaling (goal for cycling is once a day: 10 minutes)  Plan: Continue current medications; Update medication list and have PCP review. Patient would like to increase exercise with goal of 10 minutes daily.  Hyperlipidemia   Lipid Panel     Component Value Date/Time   CHOL 135 04/09/2019 1332   TRIG 93.0 04/09/2019 1332   HDL 45.80 04/09/2019 1332   CHOLHDL 3 04/09/2019 1332   VLDL 18.6 04/09/2019 1332   LDLCALC 71 04/09/2019 1332    The 10-year ASCVD risk score Mikey Bussing DC Jr., et al., 2013) is: 24%   Values used to calculate the score:     Age: 69 years     Sex: Female     Is Non-Hispanic African American: Yes     Diabetic: Yes     Tobacco smoker: No     Systolic Blood Pressure: [id number] mmHg     Is BP treated: Yes     HDL Cholesterol: 45.8 mg/dL     Total Cholesterol: 135 mg/dL   LDL goal < 100 Patient has failed these meds in past: none  Patient is currently controlled on the following medications:   No pharmacotherapy  We discussed: ASCVD risk score and benefit of statin therapy; patient is open to starting therapy  Plan: Consult with PCP regarding statin initiation.  AFIB    CBC Latest Ref Rng & Units 04/13/2019 11/14/2018 08/29/2018  WBC 4.0 - 10.5 K/uL 6.6 10.8 10.7  Hemoglobin 12.0 - 15.0 g/dL 13.2 13.5 13.5  Hematocrit 36.0 - 46.0 % 41.1 40.9 41.3  Platelets 150 - 400 K/uL 345 377 414(H)   Patient is currently neither rate or rhythm controlled.  Patient has failed these meds in past: none reported  Patient is currently controlled on the following medications:   Xarelto 20 mg - 1 tablet daily with evening meal  We discussed:  Denies missed doses, taking with dinner meal; pt denies taking diltiazem   Plan: Continue current medications  Asthma   Last spirometry score: 01/21 FEV1 77%  Eosinophil count:   Lab Results  Component Value Date/Time   EOSPCT 2 04/13/2019 04:00 PM   EOSPCT 1.3 02/01/2011 11:06 AM  %                               Eos (Absolute):  Lab Results  Component Value Date/Time   EOSABS 0.1 04/13/2019 04:00 PM   EOSABS 0.1 02/01/2011 11:06 AM   Tobacco Status:  Social History   Tobacco Use  Smoking Status Never Smoker  Smokeless Tobacco Never Used   CPAP use: nightly  Patient has failed these meds in past: none reported Patient is currently controlled on the following medications:  Maintenance:  Advair (fluticasone-salmeterol 230-21  mcg/act) - 1 puff twice daily   Spiriva (tiotropium 1.25 mcg/act) - 2 puffs daily (taking 1 puff daily)  Rescue/As needed:  Flovent (fluticasone 220 mcg) - 2 puffs BID during flare ups PRN   Albuterol by nebulizer - PRN  Allergies:  Montelukast 10 mg - 1 tablet daily at bedtime   Xolair injection 300 mg - [place] monthly   Azelastine nasal spray - BID   Ipratropium nasal spray - BID as needed  Mucinex DM - 1 daily PRN  Using maintenance inhaler regularly? Yes Frequency of rescue inhaler use: Flovent - less than once a month; using nebulizer 2-3 times a week (increased recently due to allergies)  We discussed: reports recently reduced both Spiriva and Advair inhalers to above  dosing due to thrush, started Nystatin; reports will likely increase back to usual following pulmonology appt next month; no reason to reduce Spiriva as not associated with thrush - pt may have been confused on dosing   Plan: Continue current medications; Per pulmonology telephone note, take Advair 2 puffs AM with Spiriva 2 puffs AM until follow up. Continue Nystatin after Advair dosing.   Diabetes   Followed by Dr. Diona Browner  Recent Relevant Labs: Lab Results  Component Value Date/Time   HGBA1C 6.4 04/09/2019 01:32 PM   HGBA1C 6.3 03/28/2018 11:49 AM   HGBA1C 6.2 08/14/2016 12:00 AM   HGBA1C 6.1 01/27/2016 12:00 AM    Checking BG: Weekly  Recent FBG Readings: 120s - highest 140 Hypoglycemia: denies   Patient has failed these meds in past: metformin (diarrhea) Patient is currently controlled on the following medications:  Empagliflozin-linagliptin 10-5 mg - 1 tablet daily in the morning (Glyxambi)  Last diabetic eye exam:  Lab Results  Component Value Date/Time   HMDIABEYEEXA Retinopathy (A) 12/30/2018 12:00 AM    Last diabetic foot exam: saw podiatry 09/2019 Lab Results  Component Value Date/Time   HMDIABFOOTEX done 04/01/2018 12:00 AM    We discussed: denies medication cost concerns  Plan: Continue current medications   Depression/Insomnia    Sleep: 4-5 hours per night, main concern is time to sleep onset Patient has failed these meds in past: Not taking duloxetine or doxepin, denies recall of any issues Patient is currently uncontrolled on the following medications:   No pharmacotherapy  We discussed: unsure how long she has been off duloxetine and doxepin; denies taking anything for sleep/mood and prefers not to restart at this time   Plan: Continue management of sleep with sleep hygiene.   Urge Incontinence   Followed by Dr. Cyndie Chime Patient has failed these meds in past: per chart - SE to vesicare and myrbetriq Patient is currently controlled on  the following medications:   Oxybutynin ER 5 - 1 tablet daily   Tolterodine ER 2 mg - 1 tablet daily   Azo bladder control - 1 daily  We discussed: reduced from 10 mg 09/2019, reports symptoms are fairly well controlled; pt does have dry mouth - uses Biotene rinse and spray  Constipation: Trulance 3 mg - 1 tablet daily, Psyllium (metamucil) - 2 scoops daily --> reports her constipation is at normal/baseline for her with current treatment  Plan: Continue current medications  Neuropathy    Nerve pain: mainly in feet, worsens with covering with blankets or shoes; not extremely bothered by it  Patient has failed these meds in past: none  Patient is currently controlled on the following medications:   Gabapentin 600 mg - 1 tablet at BF, 1 tablets at  lunch, 2 tablets qhs   Cyclobenzaprine 10 mg - 1 tablet qhs PRN   Topical cream compound [facility]) - applies 1-4 times a day for neuropathy (uses up to twice a day)  We discussed: unable to tell if pain is improved with gabapentin; reports pain is not very bothersome with current therapy  Plan: Continue current medications  OA/Chronic Pain   Pain: back pain - worse lately Patient has failed these meds in past: none reported Patient is currently uncontrolled on the following medications:   Acetaminophen/codeine 300-30 mg - 1 tablet q12h PRN  Cyclobenzaprine 10 mg - 1 tablet qhs   OTC Salonpas cream and spray - as needed (less than once daily, but uses on regular basis)  Voltaren gel 1% - apply up to QID PRN  Prednisone - short course   Glucosamine 1500 mg - 2 tablets daily  Boswellia - 1 tablet daily  We discussed: increased cyclobenzaprine to daily lately due to back pain; reports taking acetaminophen/codeine up to 4 tablets a day, some days just twice a day; denies NSAIDs, takes additional extra strength Tylenol occasionally; would like to request rx for Voltaren gel to [facility] in [place], [place]   Plan: Continue  current medications; Recommend taking Tylenol #3 a max of twice daily and scheduling Tylenol 500 mg twice daily alternating with Tylenol #3. Continue Voltaren gel 1% up to four times daily; do not combine with Salonpas - rotate schedule.   Migraine   Patient has failed these meds in past: none reported Patient is currently controlled on the following medications:   Sumatriptan 100 mg - May take 1 dose, repeat additional 1 dose after 2 hours  We discussed: using sparingly, denies recent migraine   Plan: Continue current medications   Graves Disease   Followed by endocrinology Patient has failed these meds in past: none reported Patient is currently controlled on the following medications:   Methimazole 5 mg - 1 tablet every other day with breakfast  We discussed: confirms adherence with meal  Plan: Continue current medications  GERD   Patient has failed these meds in past: none  Patient is currently controlled on the following medications:   Famotidine 40 mg - 1 tablet once daily at bedtime  Pantoprazole 40 mg - 1 tablet daily 30 minutes before breakfast   Daily Women's Probiotic - 1 capsule daily    We discussed: some recurrent symptoms at varied times of day, not daily and much improved; we discussed timing of medications including moving pantoprazole to 30 minutes prior to breakfast and famotidine at night to spread out dosing/coverage.  Plan: Continue current medications; Adjust timing of medications.  Vaccines   Reviewed and discussed patient's vaccination history.   Immunization History  Administered Date(s) Administered  . Fluad Quad(high Dose 65+) 04/10/2019  . Influenza,inj,Quad PF,6+ Mos 03/25/2014, 03/22/2016, 04/04/2017, 04/01/2018  . Influenza,inj,quad, With Preservative 03/16/2018  . Influenza-Unspecified 04/13/2015, 03/18/2017  . Pneumococcal Conjugate-13 05/05/2015  . Pneumococcal Polysaccharide-23 04/03/2012  . Tdap 10/19/2013  . Zoster 05/11/2013    Plan: Recommended patient receive Shingrix.   Medication Management  Misc: triamcinolone cream 0.1% - BID, Zofran 4 mg ODT - PRN, Nystatin suspension, meclizine 12.5 mg - TID PRN, Hydroxyzine 10 mg - 1-2 tabs qhs PRN itching (no longer taking - started by Urgent Care), clobetasol cream 0.05% - BID, Pataday 0.2% solution - 1 drop in each eye daily PRN, Systane - PRN, Epi-pen  OTCs: Lactase - PRN, magnesium 500 mg - 1 tablet daily,  Prevagen - 1 daily, Mucinex DM - daily (for the past few weeks), Biotene mouth rinse/lozenges for dry mouth   Pharmacy/Benefits: Medicare/BCBS FEP, DOD/[facility]  Adherence: 7 day pillbox   Affordability: denies concerns  CCM Follow Up: 12/02/19 at 11:00 AM (telephone)  Debbora Dus, PharmD Clinical Pharmacist [facility] [phone]

## 2019-11-02 NOTE — Telephone Encounter (Signed)
Per medication reconciliation, there are quite a few medications the patient is no longer taking that remain on medication list. I have marked them for review. Would you mind reviewing appropriateness of discontinuation and updating medication list? Bolded are the medications she is currently taking.   HTN:   Amlodipine 5 mg - 1 tablet daily (not taking)  Diltiazem 120 mg - 1 capsule daily (not taking)  Valsartan 160 mg - unknown (not taking)  Irbesartan 150 mg - 1 tablet daily (AM)  Furosemide 40 mg - 1 tablet daily as needed   Potassium 99 mg - 1 tablet daily (PM)  GERD:  Replaced famotidine 20 mg BID with famotidine 40 mg once daily  Mood/Pain/Insomnia:  Not taking duloxetine or doxepin (not interested in restarting at this time)  Gabapentin increased from 300 mg to gabapentin 600 mg tablets  Urge Incontinence:  Oxybutynin 10 mg reduced to oxybutynin ER 5 mg daily  We also discussed statin therapy due to diabetes and ASCVD risk score of 24%, although LDL is 71. What are your thoughts on starting a statin? Patient is open to recommendation.  Debbora Dus, PharmD Clinical Pharmacist [facility] at [facility] [phone]

## 2019-11-03 ENCOUNTER — Other Ambulatory Visit (INDEPENDENT_AMBULATORY_CARE_PROVIDER_SITE_OTHER): Payer: Medicare Other

## 2019-11-03 ENCOUNTER — Other Ambulatory Visit: Payer: Self-pay

## 2019-11-03 ENCOUNTER — Ambulatory Visit (INDEPENDENT_AMBULATORY_CARE_PROVIDER_SITE_OTHER): Payer: Medicare Other | Admitting: *Deleted

## 2019-11-03 DIAGNOSIS — J4540 Moderate persistent asthma, uncomplicated: Secondary | ICD-10-CM

## 2019-11-03 DIAGNOSIS — J309 Allergic rhinitis, unspecified: Secondary | ICD-10-CM

## 2019-11-03 DIAGNOSIS — E0500 Thyrotoxicosis with diffuse goiter without thyrotoxic crisis or storm: Secondary | ICD-10-CM | POA: Diagnosis not present

## 2019-11-03 LAB — T4, FREE: Free T4: 1.02 ng/dL (ref 0.60–1.60)

## 2019-11-03 LAB — TSH: TSH: 1.25 u[IU]/mL (ref 0.35–4.50)

## 2019-11-03 LAB — T3, FREE: T3, Free: 2.5 pg/mL (ref 2.3–4.2)

## 2019-11-03 MED ORDER — ATORVASTATIN CALCIUM 10 MG PO TABS: 10.0000 mg | ORAL_TABLET | Freq: Every day | ORAL | 3 refills | Status: DC

## 2019-11-03 NOTE — Telephone Encounter (Signed)
Medication list updated.  Spoke with Ms. Benites.  She is agreeable to starting the statin.  Rx sent to [facility] as instructed by Dr. Diona Browner.  Lab appointment scheduled for 01/19/2020 at 11:30 am.   Sharyn Lull, please review to make sure I updated everything okay.

## 2019-11-03 NOTE — Telephone Encounter (Signed)
Updated.

## 2019-11-03 NOTE — Addendum Note (Signed)
Addended by: Carter Kitten on: 11/03/2019 12:40 PM   Modules accepted: Orders

## 2019-11-03 NOTE — Telephone Encounter (Signed)
Reviewed.   [name]: Please update med list... delete the meds not taking and change doses. Agree with statin in diabetic... start atorvastatin 10 mg daily ( send in #30, 11 RF) and have her make lab chol, CMET re-eval in 3 months .. prior to her follow up on 01/21/2020. I will order  labs once appt made.  CC: [name]

## 2019-11-03 NOTE — Telephone Encounter (Signed)
Med list looks great. One thing I didn't mention is her magnesium OTC - she takes 500 mg so the 250 mg can be removed. Otherwise, all is accurate.  Thank you!  Debbora Dus, PharmD Clinical Pharmacist [facility] at [facility] [phone]

## 2019-11-09 ENCOUNTER — Telehealth: Payer: Self-pay | Admitting: Allergy and Immunology

## 2019-11-09 ENCOUNTER — Other Ambulatory Visit: Payer: Self-pay | Admitting: Allergy and Immunology

## 2019-11-09 NOTE — Telephone Encounter (Signed)
Patient returned call to office.  Informed patient of recommendation per Dr. Neldon Mc.  Patient will use Flovent 220 mcg 2 inhalations twice a day until she can get her Advair filled on May 3rd.  Patient was informed of call to pharmacy by [place] and refill sent for Advair that will be filled at next earliest time for patient on May 3rd.  Patient voiced understanding.

## 2019-11-09 NOTE — Telephone Encounter (Signed)
Left message for patient to return call.    I did speak to the pharmacist to see when's the earliest she could get a refill and was told she could on 5/3 or 5/4. I asked if they ever received the refill on 4/20 and she stated no so a refill was sent since did not have any on file.

## 2019-11-09 NOTE — Telephone Encounter (Signed)
Patient called and states that her inhaler stopped working and would like to know if we have a sample of Advair that she could have. Patient states that she does not think she can get a refill right now because of her insurance.  Patient states she is coming to [facility] this afternoon and to call her on her mobile phone, [phone].  Please advise.

## 2019-11-09 NOTE — Telephone Encounter (Signed)
Lets have her use Flovent 220 -2 inhalations twice a day until she can get her Advair filled.

## 2019-11-09 NOTE — Telephone Encounter (Signed)
Patient came into office regarding her Advair. States the device is not working and wanted to know if we had samples. I stated we did not at this time. Patient stated she would just use her rescue inhaler until she can get her refill.   Is it ok for patient to replace her Advair with Flovent until she can get the refill on her Advair?   Also, I did inform patient that I would call the pharmacy to see when she's able to get the refill and call her back once the phone system is up.

## 2019-11-10 ENCOUNTER — Other Ambulatory Visit: Payer: Self-pay | Admitting: Orthopedic Surgery

## 2019-11-10 DIAGNOSIS — M545 Low back pain, unspecified: Secondary | ICD-10-CM

## 2019-11-10 LAB — THYROID STIMULATING IMMUNOGLOBULIN: TSI: 331 % baseline — ABNORMAL HIGH (ref <140)

## 2019-11-12 ENCOUNTER — Other Ambulatory Visit: Payer: Self-pay | Admitting: Family Medicine

## 2019-11-12 MED ORDER — VOLTAREN 1 % EX GEL: 2.0000 g | Freq: Four times a day (QID) | CUTANEOUS | 2 refills | Status: DC

## 2019-11-12 NOTE — Telephone Encounter (Addendum)
Spoke with Ms. Rumbold.  She states that her pharmacist told her the prescription Voltaren works better that [place] and she gave Ms. Vonbehren a coupon to get the prescription Voltaren Gel at [facility].  She is asking that we send in Rx for her.  If okay, please sign Rx below.

## 2019-11-12 NOTE — Telephone Encounter (Signed)
Patient called.  Patient said Dr.Bedsole mentioned at patient's last visit to let her know if patient wants to try Voltaren. Patient has a question she'd like to ask [name] and would like [name] to return her call.

## 2019-11-15 ENCOUNTER — Other Ambulatory Visit: Payer: Self-pay | Admitting: Podiatry

## 2019-11-24 ENCOUNTER — Other Ambulatory Visit: Payer: Self-pay

## 2019-11-24 ENCOUNTER — Ambulatory Visit (INDEPENDENT_AMBULATORY_CARE_PROVIDER_SITE_OTHER): Payer: Medicare Other | Admitting: Allergy and Immunology

## 2019-11-24 ENCOUNTER — Encounter: Payer: Self-pay | Admitting: Allergy and Immunology

## 2019-11-24 VITALS — BP 128/80 | HR 97 | Temp 97.8°F | Resp 18 | Wt 219.2 lb

## 2019-11-24 DIAGNOSIS — J4550 Severe persistent asthma, uncomplicated: Secondary | ICD-10-CM

## 2019-11-24 DIAGNOSIS — K219 Gastro-esophageal reflux disease without esophagitis: Secondary | ICD-10-CM | POA: Diagnosis not present

## 2019-11-24 DIAGNOSIS — J3089 Other allergic rhinitis: Secondary | ICD-10-CM

## 2019-11-24 DIAGNOSIS — B370 Candidal stomatitis: Secondary | ICD-10-CM | POA: Diagnosis not present

## 2019-11-24 DIAGNOSIS — R040 Epistaxis: Secondary | ICD-10-CM

## 2019-11-24 DIAGNOSIS — J0100 Acute maxillary sinusitis, unspecified: Secondary | ICD-10-CM | POA: Diagnosis not present

## 2019-11-24 MED ORDER — SPIRIVA RESPIMAT 1.25 MCG/ACT IN AERS: 2.0000 | INHALATION_SPRAY | Freq: Every day | RESPIRATORY_TRACT | 5 refills | Status: DC

## 2019-11-24 MED ORDER — EPINEPHRINE 0.3 MG/0.3ML IJ SOAJ: INTRAMUSCULAR | 2 refills | Status: DC

## 2019-11-24 MED ORDER — MONTELUKAST SODIUM 10 MG PO TABS: 10.0000 mg | ORAL_TABLET | Freq: Every day | ORAL | 1 refills | Status: DC

## 2019-11-24 MED ORDER — ADVAIR HFA 230-21 MCG/ACT IN AERO: INHALATION_SPRAY | RESPIRATORY_TRACT | 0 refills | Status: DC

## 2019-11-24 MED ORDER — PANTOPRAZOLE SODIUM 40 MG PO TBEC: DELAYED_RELEASE_TABLET | ORAL | 1 refills | Status: DC

## 2019-11-24 MED ORDER — IPRATROPIUM BROMIDE 0.06 % NA SOLN: NASAL | 5 refills | Status: DC

## 2019-11-24 MED ORDER — NYSTATIN 100000 UNIT/ML MT SUSP: OROMUCOSAL | 5 refills | Status: DC

## 2019-11-24 MED ORDER — FAMOTIDINE 40 MG PO TABS: ORAL_TABLET | ORAL | 1 refills | Status: DC

## 2019-11-24 MED ORDER — AZELASTINE HCL 0.1 % NA SOLN: NASAL | 5 refills | Status: DC

## 2019-11-24 NOTE — Progress Notes (Signed)
[facility] [place] [facility] [place]   Follow-up Note  Referring Provider: [name], MD Primary Provider: [name], MD Date of Office Visit: 11/24/2019  Subjective:   [name] (DOB: 1946/01/09) is a 74 y.o. female who returns to the [facility] on 11/24/2019 in re-evaluation of the following:  HPI: [name] returns to this clinic in evaluation of asthma and allergic rhinitis and LPR and history of pulmonary hypertension and diastolic dysfunction and sleep apnea.  I last saw [name] in this clinic on 21 July 2019.  Overall [name] thinks that [name] has done well with [name] airway and [name] has not required a systemic steroid or an antibiotic to treat any type of airway issue.  [name] requirement for short acting bronchodilator is less than twice a week.  [name] has not had to activate [name] action plan which includes adding Flovent to [name] current dose of Advair.  [name] nose has been doing well but [name] has noticed some intermittent nosebleeds out of the left side.  [name] continues on Advair and Spiriva and montelukast along with omalizumab injections and is not using any nasal steroid at this point in time.  When [name] was last seen in this clinic [name] was having problems with recurrent sore throats.  We started [name] on nystatin to be utilized after [name] Advair and that has basically eliminated this problem.  [name] reflux appears to be under very good control using pantoprazole and famotidine.  Occasionally [name] has some "gas" and this does to appear to bother [name] with some bloating on occasion.  [name] has not contacted [name] gastroenterologist about this issue.  [name] apparently has a appointment to get the [place] vaccination tomorrow but [name] still not sure about receiving this vaccination.  Allergies as of 11/24/2019      Reactions   Latex Hives   Penicillins Swelling   Has patient had a PCN reaction causing immediate rash, facial/tongue/throat swelling, SOB or  lightheadedness with hypotension patient had a PCN reaction causing severe rash involving mucus membranes or skin necrosis: [id number] Has patient had a PCN reaction that required hospitalization/No Has patient had a PCN reaction occurring within the last 10 years: No If all of the above answers are "NO", then may proceed with Cephalosporin use.   Lyrica [pregabalin] Other (See Comments)   Blurred vision.   Phenytoin Sodium Extended Swelling   Swelling    Vicodin [hydrocodone-acetaminophen] Nausea And Vomiting      Medication List      acetaminophen-codeine 300-30 MG tablet Commonly known as: TYLENOL #3 TAKE 1 TABLET BY MOUTH EVERY 12 HOURS AS NEEDED FOR UP TO 7 DAYS FOR MODERATE PAIN   Advair HFA 230-21 MCG/ACT inhaler Generic drug: fluticasone-salmeterol INHALE 2 PUFFS BY MOUTH TWICE DAILY TO PREVENT COUGH OR WHEEZING, RINSE MOUTH AFTER USE   Align 4 MG Caps Take 1 capsule by mouth daily.   atorvastatin 10 MG tablet Commonly known as: LIPITOR Take 1 tablet (10 mg total) by mouth daily.   azelastine 0.1 % nasal spray Commonly known as: Astelin USE 1-2 SPRAYS IN EACH NOSTRIL TWICE DAILY AS NEEDED   clobetasol cream 0.05 % Commonly known as: TEMOVATE Apply 1 application topically 2 (two) times daily.   cyclobenzaprine 10 MG tablet Commonly known as: FLEXERIL Take 1 tablet (10 mg total) by mouth at bedtime as needed for muscle spasms.   DAIRY-RELIEF PO Take 1 capsule by mouth daily as needed (stomach upset).  EPINEPHrine 0.3 mg/0.3 mL Soaj injection Commonly known as: EPI-PEN USE AS DIRECTED FOR LIFE THREATENING ALLERGIC REACTIONS   famotidine 40 MG tablet Commonly known as: PEPCID TAKE 1 TABLET(40 MG) BY MOUTH DAILY   Flovent HFA 220 MCG/ACT inhaler Generic drug: fluticasone Inhale two puffs twice daily during flare-up as needed.  Rinse, gargle, and spit after use.   furosemide 40 MG tablet Commonly known as: LASIX TAKE 1 TABLET(40 MG) BY MOUTH DAILY     gabapentin 600 MG tablet Commonly known as: NEURONTIN TAKE 1 TABLET BY MOUTH DAILY AT BREAKFAST, 1 TABLET AT LUNCH, AND 2 TABLETS AT BEDTIME   Glyxambi 10-5 MG Tabs Generic drug: Empagliflozin-linaGLIPtin Take 1 tablet by mouth daily.   hydrOXYzine 10 MG tablet Commonly known as: ATARAX/VISTARIL PRN itching   ipratropium 0.06 % nasal spray Commonly known as: ATROVENT Place 1-2 sprays each nostril 1-2 times per day   irbesartan 150 MG tablet Commonly known as: AVAPRO TAKE 1 TABLET(150 MG) BY MOUTH DAILY   magnesium gluconate 500 MG tablet Commonly known as: MAGONATE Take 500 mg by mouth daily.   meclizine 12.5 MG tablet Commonly known as: ANTIVERT Take 1 tablet (12.5 mg total) by mouth 3 (three) times daily as needed for dizziness.   METAMUCIL FIBER PO Take 2 Scoops by mouth daily.   methimazole 5 MG tablet Commonly known as: TAPAZOLE Take 5 mg by mouth every other day.   montelukast 10 MG tablet Commonly known as: SINGULAIR TAKE 1 TABLET BY MOUTH EVERY DAY   NONFORMULARY OR COMPOUNDED ITEM [facility] Peripheral Neuropathy cream 5 refills  Sent in 09-29-19   nystatin 100000 UNIT/ML suspension Commonly known as: MYCOSTATIN SWISH AND SWALLOW 5 ML BY MOUTH TWICE DAILY AFTER ADVAIR   ondansetron 4 MG disintegrating tablet Commonly known as: ZOFRAN-ODT DISSOLVE 1 TO 2 TABLETS ON THE TONGUE THEN SWALLOW EVERY 8 HOURS AS NEEDED FOR NAUSEA   OneTouch Delica Lancets 99991111 Misc Use to check blood sugar up to 2 times a day.   OneTouch Verio test strip Generic drug: glucose blood Use to check blood sugar up to 2 times a day   oxybutynin 5 MG 24 hr tablet Commonly known as: DITROPAN-XL Take 5 mg by mouth daily.   pantoprazole 40 MG tablet Commonly known as: PROTONIX TAKE 1 TABLET(40 MG) BY MOUTH DAILY   Pataday 0.2 % Soln Generic drug: Olopatadine HCl INT 1 GTT IN OU QD PRN   Potassium 99 MG Tabs Take 99 mg by mouth daily.   PRESCRIPTION  MEDICATION BUP 1%/DOX 3%/GAB 6%/PEN 3%/TOP 1% 30 GM-Apply 1 to 2 pumps to effected are (foot) 3 to 4 times daily as needed.   PREVAGEN PO Take 1 tablet by mouth daily.   rivaroxaban 20 MG Tabs tablet Commonly known as: Xarelto Take 1 tablet (20 mg total) by mouth daily with supper.   Spiriva Respimat 1.25 MCG/ACT Aers Generic drug: Tiotropium Bromide Monohydrate Inhale 2 puffs into the lungs daily.   SUMAtriptan 100 MG tablet Commonly known as: IMITREX Take 100 mg x 1 may, repeat in 2 hour for migraine.   tolterodine 2 MG 24 hr capsule Commonly known as: DETROL LA Take 2 mg by mouth daily.   triamcinolone cream 0.1 % Commonly known as: KENALOG Apply 1 application topically 2 (two) times daily.   Trulance 3 MG Tabs Generic drug: Plecanatide Take 1 tablet by mouth daily.   Voltaren 1 % Gel Generic drug: diclofenac Sodium Apply 2 g topically 4 (four) times  daily.       Past Medical History:  Diagnosis Date  . Allergic rhinitis   . Allergy   . Arthritis   . Asthma   . Chronic headache   . Diabetes mellitus   . DVT (deep venous thrombosis) ([place])   . Dyspnea   . Heart murmur   . Hypertension   . Pulmonary embolism ([place])   . Pulmonary hypertension ([place])   . Seizures ([place])     Past Surgical History:  Procedure Laterality Date  . ABDOMINAL HYSTERECTOMY     partial, has ovaries  . CARDIAC CATHETERIZATION  12/28/2010   Mod. pulmonary hypertension, normal coronary arteries  . DOPPLER ECHOCARDIOGRAPHY  10/08/2011   EF=>55%,mild asymmetric LVH, mod. TR, mod. PH, mild to mod LA dilatation  . KNEE ARTHROSCOPY Left   . KNEE SURGERY    . Nuclear Stress Test  05/20/2006   No ischemia  . PARTIAL HYSTERECTOMY    . PLANTAR FASCIA SURGERY    . TONSILLECTOMY      Review of systems negative except as noted in HPI / PMHx or noted below:  Review of Systems  Constitutional: Negative.   HENT: Negative.   Eyes: Negative.   Respiratory: Negative.   Cardiovascular:  Negative.   Gastrointestinal: Negative.   Genitourinary: Negative.   Musculoskeletal: Negative.   Skin: Negative.   Neurological: Negative.   Endo/Heme/Allergies: Negative.   Psychiatric/Behavioral: Negative.      Objective:   Vitals:   11/24/19 1054  BP: 128/80  Pulse: 97  Resp: 18  Temp: 97.8 F (36.6 C)  SpO2: 96%      Weight: 219 lb 3.2 oz (99.4 kg)   Physical Exam Constitutional:      Appearance: [name] is not diaphoretic.  HENT:     Head: Normocephalic.     Right Ear: Tympanic membrane, ear canal and external ear normal.     Left Ear: Tympanic membrane, ear canal and external ear normal.     Nose: Nose normal. No mucosal edema or rhinorrhea.     Mouth/Throat:     Pharynx: Uvula midline. No oropharyngeal exudate.  Eyes:     Conjunctiva/sclera: Conjunctivae normal.  Neck:     Thyroid: No thyromegaly.     Trachea: Trachea normal. No tracheal tenderness or tracheal deviation.  Cardiovascular:     Rate and Rhythm: Normal rate and regular rhythm.     Heart sounds: S1 normal and S2 normal. Murmur (Systolic) present.  Pulmonary:     Effort: No respiratory distress.     Breath sounds: Normal breath sounds. No stridor. No wheezing or rales.  Lymphadenopathy:     Head:     Right side of head: No tonsillar adenopathy.     Left side of head: No tonsillar adenopathy.     Cervical: No cervical adenopathy.  Skin:    Findings: No erythema or rash.     Nails: There is no clubbing.  Neurological:     Mental Status: [name] is alert.     Diagnostics:    Spirometry was performed and demonstrated an FEV1 of 1.64 at 93 % of predicted.  The patient had an Asthma Control Test with the following results: ACT Total Score: 18.    Assessment and Plan:   1. Asthma, severe persistent, well-controlled   2. Other allergic rhinitis   3. LPRD (laryngopharyngeal reflux disease)   4. Thrush   5. Epistaxis   6. Acute maxillary sinusitis, recurrence not specified  1. Continue  Advair 230 - 2 inhalations twice a day  2.  Continue nystatin 5 mL swish and swallow after Advair 2 times a day  3. Continue Spiriva 2.5 respimat - two inhalations one time per day.  4. Continue Montelukast 10mg  - one tablet one time per day.  5. Continue Xolair and Epi-Pen  6. Continue pantoprazole 40mg  in the AM and Famotidine 40 mg in the PM  7. If needed:   A. Nasal saline several times per day  B. Mucinex DM two times per day  C. nasal azelastine two sprays each nostril 1-2 times per day  D. Ipratropium 0.06% -1-2 sprays each nostril 1-2 times per day to dry nose  E. Proventil HFA or albuterol nebulization  8. Add Flovent 220 - 2 inhalations 2 times a day during "FLARE UP"  9. Return to clinic in 6 months or earlier if there is a problem.  10. May need to see ENT if nose bleeds continue  11. Obtain COVID vaccine    Overall [name] appears to be doing quite well on [name] current therapy which includes a collection of anti-inflammatory agents for [name] airway including the use of omalizumab and also includes the continued use of [name] proton pump inhibitor and H2 receptor blocker on a consistent basis.  Since [name] does well with this plan I will see [name] back in this clinic in 6 months or earlier if there is a problem.  If [name] continues to have issues with nosebleeds [name] will need to see ENT regarding this issue.  I have encouraged [name] to obtain the Covid vaccine.  Allena Katz, MD Allergy / Immunology [place]

## 2019-11-24 NOTE — Telephone Encounter (Signed)
Patient requests a call back to discuss her lab results. Ph# [phone]

## 2019-11-24 NOTE — Patient Instructions (Addendum)
  1. Continue Advair 230 - 2 inhalations twice a day  2.  Continue nystatin 5 mL swish and swallow after Advair 2 times a day  3. Continue Spiriva 2.5 respimat - two inhalations one time per day.  4. Continue Montelukast 10mg  - one tablet one time per day.  5. Continue Xolair and Epi-Pen  6. Continue pantoprazole 40mg  in the AM and Famotidine 40 mg in the PM  7. If needed:   A. Nasal saline several times per day  B. Mucinex DM two times per day  C. nasal azelastine two sprays each nostril 1-2 times per day  D. Ipratropium 0.06% -1-2 sprays each nostril 1-2 times per day to dry nose  E. Proventil HFA or albuterol nebulization  8. Add Flovent 220 - 2 inhalations 2 times a day during "FLARE UP"  9. Return to clinic in 6 months or earlier if there is a problem.  10. May need to see ENT if nose bleeds continue  11. Obtain COVID vaccine

## 2019-11-25 ENCOUNTER — Encounter: Payer: Self-pay | Admitting: Allergy and Immunology

## 2019-11-25 NOTE — Telephone Encounter (Signed)
Spoke with patient and answered her thyroid las result questions, she expressed understanding and agreement.

## 2019-11-25 NOTE — Telephone Encounter (Signed)
Left message for patient to return our call at [phone].  

## 2019-12-01 ENCOUNTER — Ambulatory Visit: Payer: Self-pay

## 2019-12-01 ENCOUNTER — Telehealth: Payer: Self-pay

## 2019-12-01 MED ORDER — PREDNISONE 10 MG PO TABS
10.0000 mg | ORAL_TABLET | Freq: Every day | ORAL | 0 refills | Status: AC
Start: 2019-12-01 — End: 2019-12-11

## 2019-12-01 NOTE — Telephone Encounter (Signed)
Lets have her use a low-dose of prednisone at 10 mg daily for 10 days only.  She can keep in contact with [place] noting her response to this effect.  Did she receive her Covid vaccine?

## 2019-12-01 NOTE — Telephone Encounter (Signed)
Prednisone prescription has been sent in. Called patient and advised of Prednisone. Patient verbalized understanding and did state that she has not received the COVID vaccine yet because she is trying to ensure she is healthy and not ill. She states that she is still working on scheduling an appointment when she is well.

## 2019-12-01 NOTE — Telephone Encounter (Signed)
Patient called this morning complaining of wheezing and productive cough that started yesterday morning. She did have some dark brown sputum yesterday but none today. She just added on the Flovent yesterday. I did let her know that she should continue per the discharge instructions. She was audibly wheezing and I could tell that she did not feel well. She denied fever, chills, chest tightness. Please advise on further instructions for the patient. Thank you.

## 2019-12-01 NOTE — Addendum Note (Signed)
Addended by: Chip Boer R on: 12/01/2019 09:54 AM   Modules accepted: Orders

## 2019-12-02 ENCOUNTER — Other Ambulatory Visit: Payer: Self-pay

## 2019-12-02 ENCOUNTER — Other Ambulatory Visit: Payer: Self-pay | Admitting: Allergy and Immunology

## 2019-12-02 ENCOUNTER — Ambulatory Visit: Payer: Medicare Other

## 2019-12-02 DIAGNOSIS — I10 Essential (primary) hypertension: Secondary | ICD-10-CM

## 2019-12-02 DIAGNOSIS — E7800 Pure hypercholesterolemia, unspecified: Secondary | ICD-10-CM

## 2019-12-02 DIAGNOSIS — J4540 Moderate persistent asthma, uncomplicated: Secondary | ICD-10-CM

## 2019-12-02 DIAGNOSIS — E1140 Type 2 diabetes mellitus with diabetic neuropathy, unspecified: Secondary | ICD-10-CM

## 2019-12-02 NOTE — Patient Instructions (Addendum)
Dec 02, 2019  Dear [name],  Below is a summary of the goals we discussed during our telephone visit on 12/02/19. Please contact me anytime with questions or concerns.   Visit Information  Goals Addressed            This Visit's Progress   . Pharmacy Care Plan: Diabetes       CARE PLAN ENTRY  Current Barriers:  . Diabetes: controlled; complicated by chronic medical conditions including high blood pressure Lab Results  Component Value Date   HGBA1C 6.4 04/09/2019 .   Lab Results  Component Value Date   CREATININE 1.00 04/13/2019   CREATININE 0.94 04/09/2019   CREATININE 1.12 03/28/2018   . Current antihyperglycemic regimen: Empagliflozin-linagliptin (Glyxambi) 10 mg-5 mg - 1 tablet daily in the morning  . Denies hypoglycemic symptoms, including dizziness, lightheadedness, shaking, sweating . Current exercise: occasional use of stationary bike or foot pedaling system . Current blood glucose readings: Fasting BG: 120s  . Cardiovascular risk reduction: o Current hypertensive regimen: irbesartan 150 mg daily o Current hyperlipidemia regimen: atorvastatin 10 mg daily o Current antiplatelet regimen: no pharmacotherapy, on Xarelto  Pharmacist Clinical Goal(s):  Marland Kitchen Over the next 3 months, patient will work with PharmD and primary care provider to address need for cholesterol-lowering therapy to reduce cardiovascular risk - resolved, maintain goal of LDL < 100, HDL > 50, TG < 150   Interventions: . Assessed patient's adherence and tolerability of new medication, atorvastatin  Patient Self Care Activities:  . Patient will continue to check blood glucose weekly before breakfast, document, and provide at future appointments . Patient will focus on medication adherence by continuing to use pillbox  . Patient will continue atorvastatin 10 mg daily as prescribed . Patient will contact provider with any episodes of hypoglycemia . Patient will report any questions or concerns to  provider  . Patient will fast for cholesterol labs January 19, 2020  Please see past updates related to this goal by clicking on the "Past Updates" button in the selected goal     . Pharmacy Care Plan: General       CARE PLAN ENTRY  Current Barriers:  . Chronic Disease Management support, education, and care coordination needs related to asthma, acid reflux/hoarseness, vaccinations, pain  Pharmacist Clinical Goal(s):  Marland Kitchen Over the next 3 months, patient will work with PharmD and primary care provider to address the following goals: o COPD: Ensure accuracy of medication dosing, upon review of chart, your maintenance inhalers should be taken as follows until next pulmonology appt: - Advair 2 puffs twice daily - Spiriva 2 puffs every morning - Flovent 2 puffs twice daily as needed during asthma flare ups - Continue Nystatin after each use of Advair o Vaccinations: Recommend 2-dose series of shingles vaccine (Shingrix). o Pain: Limit overuse of Tylenol with codeine o Acid Reflux: Correct timing of medications  Interventions: . Comprehensive medication review performed  Patient Self Care Activities:  For the next 3 months until follow up visit:  . Increase Advair to 2 puffs twice daily per pulmonology note from 11/24/19 . Check with local pharmacy for shingles vaccine . Take Pantoprazole 30 minutes before breakfast for best results.  . Limit Tylenol with codeine to 2 tabs/day on bad days due to lack of benefit/increased side effects at higher doses.  Updated goal documentation    . Pharmacy Care Plan: Hypertension       CARE PLAN ENTRY: Hypertension/Weight Loss  Current Barriers:  . Controlled  hypertension, complicated by diabetes . Current antihypertensive regimen:   Irbesartan 150 mg - 1 tablet daily in the morning  Furosemide 40 mg - 1 tablet daily as needed   Potassium 99 mg - 1 tablet daily in the evening . Previous antihypertensives tried: multiple (valsartan, diltiazem,  amlodipine) . Last practice recorded BP readings:  BP Readings from Last 3 Encounters:  10/20/19 140/80  10/20/19 106/60  07/21/19 124/82 .  Current home BP readings: none, patient is not checking routinely at home  Pharmacist Clinical Goal(s):  Marland Kitchen Over the next 3 months, patient will work with PharmD and providers on the following goals: o Work towards weight loss goal of less than 200 lbs by increasing exercise and making dietary changes  Interventions: . Discussed exercise routine/goals  Patient Self Care Activities: Over the next 3 months patient will: . Continue to check BP if symptomatic, document, and provide at future appointments . Focus on medication adherence by continuing to use pillbox and updating home medication list . Increase exercise by trying cycling to achieve goal of 10 minutes per day over the next 3 months . Make one step towards healthier diet by reviewing handout on heart healthy diet over the next 3 months  . Call if abnormal weight loss/weight gain  Please see past updates related to this goal by clicking on the "Past Updates" button in the selected goal       The patient verbalized understanding of instructions provided today and agreed to receive a mailed copy of patient instruction and/or educational materials.  Telephone follow up appointment with pharmacy team member scheduled for: 03/02/20 at 10:00 AM (telephone)  Debbora Dus, PharmD Clinical Pharmacist [facility] [phone]  Exercises To Do While Sitting  Exercises that you do while sitting (chair exercises) can give you many of the same benefits as full exercise. Benefits include strengthening your heart, burning calories, and keeping muscles and joints healthy. Exercise can also improve your mood and help with depression and anxiety. You may benefit from chair exercises if you are unable to do standing exercises because of:  Diabetic foot  pain.  Obesity.  Illness.  Arthritis.  Recovery from surgery or injury.  Breathing problems.  Balance problems.  Another type of disability. Before starting chair exercises, check with your health care provider or a physical therapist to find out how much exercise you can tolerate and which exercises are safe for you. If your health care provider approves:  Start out slowly and build up over time. Aim to work up to about 10-20 minutes for each exercise session.  Make exercise part of your daily routine.  Drink water when you exercise. Do not wait until you are thirsty. Drink every 10-15 minutes.  Stop exercising right away if you have pain, nausea, shortness of breath, or dizziness.  If you are exercising in a wheelchair, make sure to lock the wheels.  Ask your health care provider whether you can do tai chi or yoga. Many positions in these mind-body exercises can be modified to do while seated. Warm-up Before starting other exercises: 1. Sit up as straight as you can. Have your knees bent at 90 degrees, which is the shape of the capital letter "L." Keep your feet flat on the floor. 2. Sit at the front edge of your chair, if you can. 3. Pull in (tighten) the muscles in your abdomen and stretch your spine and neck as straight as you can. Hold this position for  a few minutes. 4. Breathe in and out evenly. Try to concentrate on your breathing, and relax your mind. Stretching Exercise A: Arm stretch 1. Hold your arms out straight in front of your body. 2. Bend your hands at the wrist with your fingers pointing up, as if signaling someone to stop. Notice the slight tension in your forearms as you hold the position. 3. Keeping your arms out and your hands bent, rotate your hands outward as far as you can and hold this stretch. Aim to have your thumbs pointing up and your pinkie fingers pointing down. Slowly repeat arm stretches for one minute as tolerated. Exercise B: Leg  stretch 1. If you can move your legs, try to "draw" letters on the floor with the toes of your foot. Write your name with one foot. 2. Write your name with the toes of your other foot. Slowly repeat the movements for one minute as tolerated. Exercise C: Reach for the sky 1. Reach your hands as far over your head as you can to stretch your spine. 2. Move your hands and arms as if you are climbing a rope. Slowly repeat the movements for one minute as tolerated. Range of motion exercises Exercise A: Shoulder roll 1. Let your arms hang loosely at your sides. 2. Lift just your shoulders up toward your ears, then let them relax back down. 3. When your shoulders feel loose, rotate your shoulders in backward and forward circles. Do shoulder rolls slowly for one minute as tolerated. Exercise B: March in place 1. As if you are marching, pump your arms and lift your legs up and down. Lift your knees as high as you can. ? If you are unable to lift your knees, just pump your arms and move your ankles and feet up and down. March in place for one minute as tolerated. Exercise C: Seated jumping jacks 1. Let your arms hang down straight. 2. Keeping your arms straight, lift them up over your head. Aim to point your fingers to the ceiling. 3. While you lift your arms, straighten your legs and slide your heels along the floor to your sides, as wide as you can. 4. As you bring your arms back down to your sides, slide your legs back together. ? If you are unable to use your legs, just move your arms. Slowly repeat seated jumping jacks for one minute as tolerated. Strengthening exercises Exercise A: Shoulder squeeze 1. Hold your arms straight out from your body to your sides, with your elbows bent and your fists pointed at the ceiling. 2. Keeping your arms in the bent position, move them forward so your elbows and forearms meet in front of your face. 3. Open your arms back out as wide as you can with your  elbows still bent, until you feel your shoulder blades squeezing together. Hold for 5 seconds. Slowly repeat the movements forward and backward for one minute as tolerated. Contact a health care provider if you:  Had to stop exercising due to any of the following: ? Pain. ? Nausea. ? Shortness of breath. ? Dizziness. ? Fatigue.  Have significant pain or soreness after exercising. Get help right away if you have:  Chest pain.  Difficulty breathing. These symptoms may represent a serious problem that is an emergency. Do not wait to see if the symptoms will go away. Get medical help right away. Call your local emergency services (911 in the [place] Do not drive yourself to the hospital. This information  is not intended to replace advice given to you by your health care provider. Make sure you discuss any questions you have with your health care provider. Document Revised: 10/23/2018 Document Reviewed: 05/15/2017 Elsevier Patient Education  2020 [employer].

## 2019-12-02 NOTE — Chronic Care Management (AMB) (Signed)
Chronic Care Management Pharmacy  Name: [name]  MRN: [id number] DOB: Dec 20, 1945  Chief Complaint/ HPI  [name],  74 y.o., female presents for their Follow-Up CCM visit with the clinical pharmacist via telephone.  PCP : [name], MD  Their chronic conditions include: hypertension, afib, migraine headache, asthma, allergic rhinitis, sleep apnea, GERD, diabetes, neuropathy, Graves disease, osteoarthritis, depression, chronic pain, urge incontinence, high cholesterol   Changes since last CCM visit on 11/02/19 - PharmD consulted PCP to begin atorvastatin 10 mg daily, pt had asthma flare up, started on prednisone x 10 days   Office Visits: Last CCM visit 11/02/19  12/01/19: Allergy/Asthma - pt called with wheezing/SOB/sputum, advised to take prednisone for 10 days and add Flovent until symptoms resolved  11/24/19: Allergy/Asthma - cont current meds  Consult Visit:  10/20/19: Endocrinology - thyrotoxicosis/thyroidnodule, decreased methimazole to 5 mg every other day with meal 09/2018, continues on diltiazem CD, rtc 6 months  10/06/19: Immunotherapy - asthma, Xolair injection  09/29/19: Podiatry - foot exam/debridement  08/04/19: Telephone (pulmonology) - question regarding 2 very similar inhalers and thrush, may try Advair once daily along with Spiriva due to hoaresness  07/21/19: Pulmonology - thrush - continue Advair 2 puffs BID, start Nystatin after Advair BID, continue Spiriva 2 puffs daily, montelukast, Xolair, and Epi-Pen, PRN - saline spray, Mucinex DM BID, azelastine nasal spray BID, ipratropium nasal spray- BID PRN, albuterol nebulizer or inhaler PRN  - add Flovent 220 - 2 puffs BID for flare ups   04/23/19: Cardiology - cont current meds  Allergies  Allergen Reactions  . Latex Hives  . Penicillins Swelling    Has patient had a PCN reaction causing immediate rash, facial/tongue/throat swelling, SOB or lightheadedness with hypotension patient had a PCN reaction  causing severe rash involving mucus membranes or skin necrosis: [id number] Has patient had a PCN reaction that required hospitalization/No Has patient had a PCN reaction occurring within the last 10 years: No If all of the above answers are "NO", then may proceed with Cephalosporin use.     Recardo Evangelist [Pregabalin] Other (See Comments)    Blurred vision.  Marland Kitchen Phenytoin Sodium Extended Swelling    Swelling   . Vicodin [Hydrocodone-Acetaminophen] Nausea And Vomiting   Medications: Outpatient Encounter Medications as of 12/02/2019  Medication Sig  . acetaminophen-codeine (TYLENOL #3) 300-30 MG tablet TAKE 1 TABLET BY MOUTH EVERY 12 HOURS AS NEEDED FOR UP TO 7 DAYS FOR MODERATE PAIN  . Apoaequorin (PREVAGEN PO) Take 1 tablet by mouth daily.  Marland Kitchen atorvastatin (LIPITOR) 10 MG tablet Take 1 tablet (10 mg total) by mouth daily.  Marland Kitchen azelastine (ASTELIN) 0.1 % nasal spray USE 1-2 SPRAYS IN EACH NOSTRIL TWICE DAILY AS NEEDED  . clobetasol cream (TEMOVATE) [id number] % Apply 1 application topically 2 (two) times daily.  . cyclobenzaprine (FLEXERIL) 10 MG tablet Take 1 tablet (10 mg total) by mouth at bedtime as needed for muscle spasms.  . Empagliflozin-linaGLIPtin (GLYXAMBI) 10-5 MG TABS Take 1 tablet by mouth daily.  Marland Kitchen EPINEPHrine 0.3 mg/0.3 mL IJ SOAJ injection USE AS DIRECTED FOR LIFE THREATENING ALLERGIC REACTIONS  . famotidine (PEPCID) 40 MG tablet TAKE 1 TABLET(40 MG) BY MOUTH DAILY  . fluticasone (FLOVENT HFA) 220 MCG/ACT inhaler Inhale two puffs twice daily during flare-up as needed.  Rinse, gargle, and spit after use.  . fluticasone-salmeterol (ADVAIR HFA) 230-21 MCG/ACT inhaler INHALE 2 PUFFS BY MOUTH TWICE DAILY TO PREVENT COUGH OR WHEEZING, RINSE MOUTH AFTER USE  . furosemide (  LASIX) 40 MG tablet TAKE 1 TABLET(40 MG) BY MOUTH DAILY  . gabapentin (NEURONTIN) 600 MG tablet TAKE 1 TABLET BY MOUTH DAILY AT BREAKFAST, 1 TABLET AT LUNCH, AND 2 TABLETS AT BEDTIME  . hydrOXYzine (ATARAX/VISTARIL) 10 MG  tablet PRN itching  . ipratropium (ATROVENT) 0.06 % nasal spray Place 1-2 sprays each nostril 1-2 times per day  . irbesartan (AVAPRO) 150 MG tablet TAKE 1 TABLET(150 MG) BY MOUTH DAILY  . Lactase (DAIRY-RELIEF PO) Take 1 capsule by mouth daily as needed (stomach upset).   . magnesium gluconate (MAGONATE) 500 MG tablet Take 500 mg by mouth daily.  . meclizine (ANTIVERT) 12.5 MG tablet Take 1 tablet (12.5 mg total) by mouth 3 (three) times daily as needed for dizziness.  . methimazole (TAPAZOLE) 5 MG tablet Take 5 mg by mouth every other day.  . montelukast (SINGULAIR) 10 MG tablet Take 1 tablet (10 mg total) by mouth daily.  . NONFORMULARY OR COMPOUNDED Savage #11 Peripheral Neuropathy cream 5 refills  Sent in 09-29-19  . nystatin (MYCOSTATIN) 100000 UNIT/ML suspension SWISH AND SWALLOW 5 ML BY MOUTH TWICE DAILY AFTER ADVAIR  . ondansetron (ZOFRAN-ODT) 4 MG disintegrating tablet DISSOLVE 1 TO 2 TABLETS ON THE TONGUE THEN SWALLOW EVERY 8 HOURS AS NEEDED FOR NAUSEA  . OneTouch Delica Lancets 99991111 MISC Use to check blood sugar up to 2 times a day.  [name] VERIO test strip Use to check blood sugar up to 2 times a day  . oxybutynin (DITROPAN-XL) 5 MG 24 hr tablet Take 5 mg by mouth daily.  . pantoprazole (PROTONIX) 40 MG tablet TAKE 1 TABLET(40 MG) BY MOUTH DAILY  . PATADAY 0.2 % SOLN INT 1 GTT IN OU QD PRN  . Potassium 99 MG TABS Take 99 mg by mouth daily.  . predniSONE (DELTASONE) 10 MG tablet Take 1 tablet (10 mg total) by mouth daily with breakfast for 10 days.  Marland Kitchen PRESCRIPTION MEDICATION BUP 1%/DOX 3%/GAB 6%/PEN 3%/TOP 1% 30 GM-Apply 1 to 2 pumps to effected are (foot) 3 to 4 times daily as needed.  . Probiotic Product (ALIGN) 4 MG CAPS Take 1 capsule by mouth daily.  . Psyllium (METAMUCIL FIBER PO) Take 2 Scoops by mouth daily.  . rivaroxaban (XARELTO) 20 MG TABS tablet Take 1 tablet (20 mg total) by mouth daily with supper.  . SUMAtriptan (IMITREX) 100 MG tablet Take 100  mg x 1 may, repeat in 2 hour for migraine.  . Tiotropium Bromide Monohydrate (SPIRIVA RESPIMAT) 1.25 MCG/ACT AERS Inhale 2 puffs into the lungs daily.  Marland Kitchen tolterodine (DETROL LA) 2 MG 24 hr capsule Take 2 mg by mouth daily.  Marland Kitchen triamcinolone cream (KENALOG) 0.1 % Apply 1 application topically 2 (two) times daily.  . TRULANCE 3 MG TABS Take 1 tablet by mouth daily.   . VOLTAREN 1 % GEL Apply 2 g topically 4 (four) times daily.   Facility-Administered Encounter Medications as of 12/02/2019  Medication  . omalizumab Arvid Right) injection 300 mg   Current Diagnosis/Assessment:   Financial Resource Strain: Low Risk   . Difficulty of Paying Living Expenses: Not hard at all    Goals    . Increase water intake     Starting 03/28/2018, I will continue to drink at least 6-8 glasses of water daily.     Marland Kitchen Pharmacy Care Plan: Diabetes     CARE PLAN ENTRY  Current Barriers:  . Diabetes: controlled; complicated by chronic medical conditions including high blood pressure Lab  Results  Component Value Date   HGBA1C 6.4 04/09/2019 .   Lab Results  Component Value Date   CREATININE 1.00 04/13/2019   CREATININE 0.94 04/09/2019   CREATININE 1.12 03/28/2018   . Current antihyperglycemic regimen: Empagliflozin-linagliptin (Glyxambi) 10 mg-5 mg - 1 tablet daily in the morning  . Denies hypoglycemic symptoms, including dizziness, lightheadedness, shaking, sweating . Current exercise: occasional use of stationary bike or foot pedaling system . Current blood glucose readings: Fasting BG: 120s  . Cardiovascular risk reduction: o Current hypertensive regimen: irbesartan 150 mg daily o Current hyperlipidemia regimen: atorvastatin 10 mg daily o Current antiplatelet regimen: no pharmacotherapy, on Xarelto  Pharmacist Clinical Goal(s):  Marland Kitchen Over the next 3 months, patient will work with PharmD and primary care provider to address need for cholesterol-lowering therapy to reduce cardiovascular risk - resolved,  maintain goal of LDL < 100, HDL > 50, TG < 150   Interventions: . Assessed patient's adherence and tolerability of new medication, atorvastatin  Patient Self Care Activities:  . Patient will continue to check blood glucose weekly before breakfast, document, and provide at future appointments . Patient will focus on medication adherence by continuing to use pillbox  . Patient will continue atorvastatin 10 mg daily as prescribed . Patient will contact provider with any episodes of hypoglycemia . Patient will report any questions or concerns to provider  . Patient will fast for cholesterol labs January 19, 2020  Please see past updates related to this goal by clicking on the "Past Updates" button in the selected goal     . Pharmacy Care Plan: General     CARE PLAN ENTRY  Current Barriers:  . Chronic Disease Management support, education, and care coordination needs related to asthma, acid reflux/hoarseness, vaccinations, pain  Pharmacist Clinical Goal(s):  Marland Kitchen Over the next 3 months, patient will work with PharmD and primary care provider to address the following goals: o COPD: Ensure accuracy of medication dosing, upon review of chart, your maintenance inhalers should be taken as follows until next pulmonology appt: - Advair 2 puffs twice daily - Spiriva 2 puffs every morning - Flovent 2 puffs twice daily as needed during asthma flare ups - Continue Nystatin after each use of Advair o Vaccinations: Recommend 2-dose series of shingles vaccine (Shingrix). o Pain: Limit overuse of Tylenol with codeine o Acid Reflux: Correct timing of medications  Interventions: . Comprehensive medication review performed  Patient Self Care Activities:  For the next 3 months until follow up visit:  . Increase Advair to 2 puffs twice daily per pulmonology note from 11/24/19 . Check with local pharmacy for shingles vaccine . Take Pantoprazole 30 minutes before breakfast for best results.  . Limit Tylenol  with codeine to 2 tabs/day on bad days due to lack of benefit/increased side effects at higher doses.  Updated goal documentation    . Pharmacy Care Plan: Hypertension     CARE PLAN ENTRY: Hypertension/Weight Loss  Current Barriers:  . Controlled hypertension, complicated by diabetes . Current antihypertensive regimen:   Irbesartan 150 mg - 1 tablet daily in the morning  Furosemide 40 mg - 1 tablet daily as needed   Potassium 99 mg - 1 tablet daily in the evening . Previous antihypertensives tried: multiple (valsartan, diltiazem, amlodipine) . Last practice recorded BP readings:  BP Readings from Last 3 Encounters:  10/20/19 140/80  10/20/19 106/60  07/21/19 124/82 .  Current home BP readings: none, patient is not checking routinely at home  Pharmacist Clinical  Goal(s):  Marland Kitchen Over the next 3 months, patient will work with PharmD and providers on the following goals: o Work towards weight loss goal of less than 200 lbs by increasing exercise and making dietary changes  Interventions: . Discussed exercise routine/goals  Patient Self Care Activities: Over the next 3 months patient will: . Continue to check BP if symptomatic, document, and provide at future appointments . Focus on medication adherence by continuing to use pillbox and updating home medication list . Increase exercise by trying cycling to achieve goal of 10 minutes per day over the next 3 months . Make one step towards healthier diet by reviewing handout on heart healthy diet over the next 3 months  . Call if abnormal weight loss/weight gain  Please see past updates related to this goal by clicking on the "Past Updates" button in the selected goal       Exercise/Weight loss   Goal set at previous visit: begin exercise in 10 minute intervals  Weight 10/30/19: 216 lbs (down from 250 lbs in the past year)  Weight 12/02/19: 198.9 lbs (home scale)  Goal weight: < 200 lbs  Exercise: unable to walk due to energy,  started some cycling/foot pedaling in 5 minute intervals and experienced back pain, plans to try stationary cycling instead  Assessment: Seems patient's scale may be inaccurate as weight in office on 11/24/19 was 219 lbs. Per patient scale this morning, she was down to 199 lbs. Expressed concern about dramatic weight loss. Pt denies change in diet and has only minimally increased exercise. Does report some diarrhea but resolved.  Plan: Try bicycling for 10 minute intervals 2-3 days per week.  Hyperlipidemia   Lipid Panel     Component Value Date/Time   CHOL 135 04/09/2019 1332   TRIG 93.0 04/09/2019 1332   HDL 45.80 04/09/2019 1332   CHOLHDL 3 04/09/2019 1332   VLDL 18.6 04/09/2019 1332   LDLCALC 71 04/09/2019 1332    The 10-year ASCVD risk score Mikey Bussing DC Jr., et al., 2013) is: 21%   Values used to calculate the score:     Age: 32 years     Sex: Female     Is Non-Hispanic African American: Yes     Diabetic: Yes     Tobacco smoker: No     Systolic Blood Pressure: 0000000 mmHg     Is BP treated: Yes     HDL Cholesterol: 45.8 mg/dL     Total Cholesterol: 135 mg/dL   LDL goal < 100 Patient has failed these meds in past: none  Patient is currently controlled on the following medications:   Atorvastatin 10 mg - 1 tablet daily  We discussed:pt started statin as recommended at previous visit, reports doing well  Plan: Continue current medications, repeat lipid panel July 2021 scheduled. Continue weight loss efforts/exercise.   Asthma   Last spirometry score: 01/21 FEV1 77%  Eosinophil count:   Lab Results  Component Value Date/Time   EOSPCT 2 04/13/2019 04:00 PM   EOSPCT 1.3 02/01/2011 11:06 AM  %                               Eos (Absolute):  Lab Results  Component Value Date/Time   EOSABS 0.1 04/13/2019 04:00 PM   EOSABS 0.1 02/01/2011 11:06 AM   Tobacco Status:  Social History   Tobacco Use  Smoking Status Never Smoker  Smokeless Tobacco Never Used  CPAP use:  most nights - used less recently due to back pain, recent bought of diarrhea keeping her up at night Patient has failed these meds in past: none reported Patient is currently uncontrolled on the following medications:  Maintenance:  Advair (fluticasone-salmeterol 230-21 mcg/act) - 1 puff twice daily   Spiriva (tiotropium 1.25 mcg/act) - 2 puffs daily   Rescue/As needed:  Flovent (fluticasone 220 mcg) - 2 puffs BID during flare ups PRN   Albuterol by nebulizer - PRN  Allergies:  Montelukast 10 mg - 1 tablet daily at bedtime   Xolair injection 300 mg - [place] monthly   Azelastine nasal spray - BID   Ipratropium nasal spray - BID as needed  Mucinex DM - 1 daily PRN  Using maintenance inhaler regularly? Yes Frequency of rescue inhaler use: Flovent - less than once a month; using nebulizer 2-3 times a week - currently having asthma flare and on prednisone x 10 days   We discussed: previously reduced Advair from 2 puffs BID to 1 puff BID due to thrush/hoarseness and started Nystatin; At last visit pt was taking 1 puff Spiriva but has corrected to 2 puffs per our discussion. She reports using nebulizer less frequently until recent flare started yesterday. Per pulmonology patient is back on full dose Advair, however, pt still taking half usual dose. Will need to clarify dosing. Pt did run out of Advair and had to replace with Flovent x 1 day in the last month.  Plan: Continue current medications; Resume Advair - 2 puffs BID per recent pulmonology visit.   Depression/Insomnia    Sleep: reports some improvement in the past month, still difficulty falling asleep  Patient has failed these meds in past: duloxetine and doxepin, denies recall of any side effects  Patient is currently controlled on the following medications:   No pharmacotherapy  We discussed: prefers not to take medication for sleep/mood at this time; sleep has improved with eye mask   Plan: Continue healthy sleep habits    OA/Chronic Pain   Pain: back pain - stable  Patient has failed these meds in past: none reported Patient is currently controlled on the following medications:   Acetaminophen/codeine 300-30 mg - 1 tablet q12h PRN  Cyclobenzaprine 10 mg - 1 tablet qhs   OTC Salonpas cream and spray - as needed (less than once daily, but uses on regular basis)  Voltaren gel 1% - apply up to QID PRN  Tylenol Arthritis - PRN breakthrough pain  Supplements:  Glucosamine 1500 mg - 2 tablets daily  Boswellia - 1 tablet daily  We discussed: reports taking Tylenol with codeine PRN severe pain up to QID, not every day; some weeks does not need at all; continues Tylenol Arthritis for breakthrough pain, cyclobenzaprine qhs, and Voltaren gel PRN  Recommended trying to limit codeine to 2 tabs/day due to lack of benefit/increased side effects at higher doses.   Plan: Continue current medications; Limit tylenol with codeine to 2 tabs/day due to lack of benefit/increased side effects at higher doses.   GERD   Patient has failed these meds in past: none  Patient is currently controlled on the following medications:   Famotidine 40 mg - 1 tablet once daily at bedtime  Pantoprazole 40 mg - 1 tablet daily 30 minutes before breakfast   Daily Women's Probiotic - 1 capsule daily    We discussed: denies symptoms this week, still using alka seltzer for breakthrough symptoms; asked about timing of medications and patient reports  forgetting to take pantoprazole 30 minute before BF but will right down on her med list. Taking Pepcid at bedtime as recommended.   Plan: Continue current medications. Take Pantoprazole 30 minutes before breakfast for best results.   Medication Management  Pharmacy/Benefits: Medicare/BCBS FEP, DOD/[facility]  Adherence: 7 day pillbox   Affordability: denies concerns  CCM Follow Up: 03/02/20 at 10:00 AM (telephone)  Debbora Dus, PharmD Clinical Pharmacist [facility]  at [place] [phone]

## 2019-12-03 ENCOUNTER — Other Ambulatory Visit: Payer: Self-pay | Admitting: Allergy and Immunology

## 2019-12-04 DIAGNOSIS — K5901 Slow transit constipation: Secondary | ICD-10-CM | POA: Diagnosis not present

## 2019-12-04 DIAGNOSIS — R194 Change in bowel habit: Secondary | ICD-10-CM | POA: Diagnosis not present

## 2019-12-04 DIAGNOSIS — R143 Flatulence: Secondary | ICD-10-CM | POA: Diagnosis not present

## 2019-12-04 DIAGNOSIS — K219 Gastro-esophageal reflux disease without esophagitis: Secondary | ICD-10-CM | POA: Diagnosis not present

## 2019-12-05 ENCOUNTER — Ambulatory Visit
Admission: RE | Admit: 2019-12-05 | Discharge: 2019-12-05 | Disposition: A | Discharge status: Home / Self Care | Payer: Medicare Other | Source: Ambulatory Visit | Attending: Orthopedic Surgery | Admitting: Orthopedic Surgery

## 2019-12-05 DIAGNOSIS — M48061 Spinal stenosis, lumbar region without neurogenic claudication: Secondary | ICD-10-CM | POA: Diagnosis not present

## 2019-12-05 DIAGNOSIS — M545 Low back pain, unspecified: Secondary | ICD-10-CM

## 2019-12-08 DIAGNOSIS — M5136 Other intervertebral disc degeneration, lumbar region: Secondary | ICD-10-CM | POA: Diagnosis not present

## 2019-12-09 ENCOUNTER — Other Ambulatory Visit: Payer: Self-pay | Admitting: Orthopedic Surgery

## 2019-12-09 DIAGNOSIS — M5136 Other intervertebral disc degeneration, lumbar region: Secondary | ICD-10-CM

## 2019-12-10 DIAGNOSIS — M5136 Other intervertebral disc degeneration, lumbar region: Secondary | ICD-10-CM | POA: Diagnosis not present

## 2019-12-11 DIAGNOSIS — J4550 Severe persistent asthma, uncomplicated: Secondary | ICD-10-CM

## 2019-12-15 ENCOUNTER — Ambulatory Visit: Payer: Self-pay

## 2019-12-15 ENCOUNTER — Ambulatory Visit (INDEPENDENT_AMBULATORY_CARE_PROVIDER_SITE_OTHER): Payer: Medicare Other

## 2019-12-15 ENCOUNTER — Other Ambulatory Visit: Payer: Self-pay

## 2019-12-15 DIAGNOSIS — J4550 Severe persistent asthma, uncomplicated: Secondary | ICD-10-CM

## 2019-12-16 ENCOUNTER — Ambulatory Visit: Payer: Medicare Other | Admitting: Infectious Disease

## 2019-12-28 ENCOUNTER — Other Ambulatory Visit: Payer: Medicare Other

## 2019-12-29 ENCOUNTER — Ambulatory Visit
Admission: RE | Admit: 2019-12-29 | Discharge: 2019-12-29 | Disposition: A | Discharge status: Home / Self Care | Payer: Medicare Other | Source: Ambulatory Visit | Attending: Orthopedic Surgery | Admitting: Orthopedic Surgery

## 2019-12-29 DIAGNOSIS — M5136 Other intervertebral disc degeneration, lumbar region: Secondary | ICD-10-CM

## 2019-12-29 DIAGNOSIS — M545 Low back pain: Secondary | ICD-10-CM | POA: Diagnosis not present

## 2019-12-30 ENCOUNTER — Ambulatory Visit: Payer: Medicare Other | Admitting: Internal Medicine

## 2020-01-04 ENCOUNTER — Ambulatory Visit (INDEPENDENT_AMBULATORY_CARE_PROVIDER_SITE_OTHER): Payer: Medicare Other | Admitting: Internal Medicine

## 2020-01-04 ENCOUNTER — Telehealth (HOSPITAL_COMMUNITY): Payer: Self-pay

## 2020-01-04 ENCOUNTER — Other Ambulatory Visit: Payer: Self-pay

## 2020-01-04 ENCOUNTER — Telehealth: Payer: Self-pay

## 2020-01-04 ENCOUNTER — Encounter: Payer: Self-pay | Admitting: Internal Medicine

## 2020-01-04 DIAGNOSIS — M4647 Discitis, unspecified, lumbosacral region: Secondary | ICD-10-CM

## 2020-01-04 NOTE — Telephone Encounter (Signed)
Per Dr Linus Salmons called IR to schedule Lumbar Disc Aspiration. Will need results before scheduling picc line. Left voicemail with Caryl Pina for appointment. Will continue to follow. [place]

## 2020-01-04 NOTE — Progress Notes (Signed)
[facility]      Reason for Consult: lumbar discitis    Referring Physician: Dr. Gladstone Lighter    Patient ID: [name], female    DOB: 27-Sep-1945, 74 y.o.   MRN: [id number]  HPI:   She is here for evaluation of discitis/osteomyelitis of the L4-S1 area.  After 2 previous cancellations, she is here today for evaluation. She has had progressively worsening low back pain with radiation down her left leg.  No numbness or tingling.  No loss of bowel or bladder.  No fever.  MRI and follow up CT with osteomyelitis/discitis.  Has not been on antiiotics. She has a penicillin allergy that is remote.  She does not remember any specifics other than a mild rash; no hives, no sob, no throat swelling.     Past Medical History:  Diagnosis Date  . Allergic rhinitis   . Allergy   . Arthritis   . Asthma   . Chronic headache   . Diabetes mellitus   . DVT (deep venous thrombosis) ([place])   . Dyspnea   . Heart murmur   . Hypertension   . Pulmonary embolism ([place])   . Pulmonary hypertension ([place])   . Seizures ([place])     Prior to Admission medications   Medication Sig Start Date End Date Taking? Authorizing Provider  acetaminophen-codeine (TYLENOL #3) 300-30 MG tablet TAKE 1 TABLET BY MOUTH EVERY 12 HOURS AS NEEDED FOR UP TO 7 DAYS FOR MODERATE PAIN 10/13/19  Yes Bedsole, Amy E, MD  Apoaequorin (PREVAGEN PO) Take 1 tablet by mouth daily.   Yes [provider]  atorvastatin (LIPITOR) 10 MG tablet Take 1 tablet (10 mg total) by mouth daily. 11/03/19  Yes Bedsole, Amy E, MD  azelastine (ASTELIN) 0.1 % nasal spray USE 1-2 SPRAYS IN EACH NOSTRIL TWICE DAILY AS NEEDED 11/24/19  Yes Kozlow, Donnamarie Poag, MD  clobetasol cream (TEMOVATE) 6.62 % Apply 1 application topically 2 (two) times daily.   Yes [provider]  cyclobenzaprine (FLEXERIL) 10 MG tablet Take 1 tablet (10 mg total) by mouth at bedtime as needed for muscle spasms. 12/11/18  Yes Bedsole, Amy E, MD    Empagliflozin-linaGLIPtin (GLYXAMBI) 10-5 MG TABS Take 1 tablet by mouth daily. 08/04/19  Yes Bedsole, Amy E, MD  EPINEPHrine 0.3 mg/0.3 mL IJ SOAJ injection USE AS DIRECTED FOR LIFE THREATENING ALLERGIC REACTIONS 11/24/19  Yes Kozlow, Donnamarie Poag, MD  famotidine (PEPCID) 40 MG tablet TAKE 1 TABLET(40 MG) BY MOUTH DAILY 11/24/19  Yes Kozlow, Donnamarie Poag, MD  fluticasone (FLOVENT HFA) 220 MCG/ACT inhaler Inhale two puffs twice daily during flare-up as needed.  Rinse, gargle, and spit after use. 02/09/19  Yes Kozlow, Donnamarie Poag, MD  fluticasone-salmeterol (ADVAIR HFA) 230-21 MCG/ACT inhaler INHALE 2 PUFFS BY MOUTH TWICE DAILY TO PREVENT COUGH OR WHEEZING, RINSE MOUTH AFTER USE 11/24/19  Yes Kozlow, Donnamarie Poag, MD  furosemide (LASIX) 40 MG tablet TAKE 1 TABLET(40 MG) BY MOUTH DAILY 07/28/18  Yes Croitoru, Mihai, MD  gabapentin (NEURONTIN) 600 MG tablet TAKE 1 TABLET BY MOUTH DAILY AT BREAKFAST, 1 TABLET AT LUNCH, AND 2 TABLETS AT BEDTIME 11/16/19  Yes Hyatt, Max T, DPM  hydrOXYzine (ATARAX/VISTARIL) 10 MG tablet PRN itching 10/01/17  Yes [provider]  ipratropium (ATROVENT) 0.06 % nasal spray Place 1-2 sprays each nostril 1-2 times per day 11/24/19  Yes Kozlow, Donnamarie Poag, MD  irbesartan (AVAPRO) 150 MG tablet TAKE 1 TABLET(150 MG) BY MOUTH DAILY 07/06/19  Yes Croitoru, Mihai, MD  Lactase (DAIRY-RELIEF PO) Take 1 capsule by mouth daily as needed (stomach upset).    Yes [provider]  levalbuterol (XOPENEX) 1.25 MG/3ML nebulizer solution USE 1 VIAL VIA NEBULIZER EVERY 6 HOURS AS NEEDED FOR WHEEZING OR SHORTNESS OF BREATH 12/03/19  Yes Kozlow, Donnamarie Poag, MD  magnesium gluconate (MAGONATE) 500 MG tablet Take 500 mg by mouth daily.   Yes [provider]  meclizine (ANTIVERT) 12.5 MG tablet Take 1 tablet (12.5 mg total) by mouth 3 (three) times daily as needed for dizziness. 04/08/19  Yes Wardell Honour, MD  methimazole (TAPAZOLE) 5 MG tablet Take 5 mg by mouth every other day.   Yes [provider]   montelukast (SINGULAIR) 10 MG tablet Take 1 tablet (10 mg total) by mouth daily. 11/24/19  Yes Kozlow, Donnamarie Poag, MD  NONFORMULARY OR COMPOUNDED ITEM [facility] #[facility] Peripheral Neuropathy cream 5 refills  Sent in 09-29-19   Yes [provider]  nystatin (MYCOSTATIN) 100000 UNIT/ML suspension SWISH AND SWALLOW 5 ML BY MOUTH TWICE DAILY AFTER ADVAIR 11/24/19  Yes Kozlow, Donnamarie Poag, MD  ondansetron (ZOFRAN-ODT) 4 MG disintegrating tablet DISSOLVE 1 TO 2 TABLETS ON THE TONGUE THEN SWALLOW EVERY 8 HOURS AS NEEDED FOR NAUSEA 07/07/19  Yes [provider]  OneTouch Delica Lancets 78H MISC Use to check blood sugar up to 2 times a day. 10/20/19  Yes [name], MD  ONETOUCH VERIO test strip Use to check blood sugar up to 2 times a day 10/20/19  Yes Bedsole, Amy E, MD  oxybutynin (DITROPAN-XL) 5 MG 24 hr tablet Take 5 mg by mouth daily. 09/18/19  Yes [provider]  pantoprazole (PROTONIX) 40 MG tablet TAKE 1 TABLET(40 MG) BY MOUTH DAILY 11/24/19  Yes Kozlow, Donnamarie Poag, MD  PATADAY 0.2 % SOLN INT 1 GTT IN OU QD PRN 01/27/17  Yes [provider]  Potassium 99 MG TABS Take 99 mg by mouth daily.   Yes [provider]  PRESCRIPTION MEDICATION BUP 1%/DOX 3%/GAB 6%/PEN 3%/TOP 1% 30 GM-Apply 1 to 2 pumps to effected are (foot) 3 to 4 times daily as needed.   Yes [provider]  Probiotic Product (ALIGN) 4 MG CAPS Take 1 capsule by mouth daily.   Yes [provider]  Psyllium (METAMUCIL FIBER PO) Take 2 Scoops by mouth daily.   Yes [provider]  rivaroxaban (XARELTO) 20 MG TABS tablet Take 1 tablet (20 mg total) by mouth daily with supper. 08/31/19  Yes Croitoru, Mihai, MD  SUMAtriptan (IMITREX) 100 MG tablet Take 100 mg x 1 may, repeat in 2 hour for migraine. 11/05/16  Yes Bedsole, Amy E, MD  Tiotropium Bromide Monohydrate (SPIRIVA RESPIMAT) 1.25 MCG/ACT AERS Inhale 2 puffs into the lungs daily. 11/24/19  Yes Kozlow, Donnamarie Poag, MD  tolterodine  (DETROL LA) 2 MG 24 hr capsule Take 2 mg by mouth daily.   Yes [provider]  triamcinolone cream (KENALOG) 0.1 % Apply 1 application topically 2 (two) times daily. 03/10/19  Yes Bedsole, Amy E, MD  TRULANCE 3 MG TABS Take 1 tablet by mouth daily.  12/06/18  Yes [provider]  VOLTAREN 1 % GEL Apply 2 g topically 4 (four) times daily. 11/12/19  Yes [name], MD    Allergies  Allergen Reactions  . Latex Hives  . Penicillins Swelling    Has patient had a PCN reaction causing immediate rash, facial/tongue/throat swelling, SOB or lightheadedness with hypotension  patient had a PCN reaction causing severe rash involving mucus membranes or skin necrosis: [id number] Has patient had a PCN reaction that required hospitalization/No Has patient had a PCN reaction occurring within the last 10 years: No If all of the above answers are "NO", then may proceed with Cephalosporin use.     Recardo Evangelist [Pregabalin] Other (See Comments)    Blurred vision.  Marland Kitchen Phenytoin Sodium Extended Swelling    Swelling   . Vicodin [Hydrocodone-Acetaminophen] Nausea And Vomiting    Social History   Tobacco Use  . Smoking status: Never Smoker  . Smokeless tobacco: Never Used  Vaping Use  . Vaping Use: Never used  Substance Use Topics  . Alcohol use: Not Currently    Alcohol/week: 0.0 standard drinks    Comment: occ glass on wine  . Drug use: No    Family History  Problem Relation Age of Onset  . Hypertension Mother   . Clotting disorder Mother   . Breast cancer Mother   . Arthritis Mother   . Stroke Mother   . Diabetes Mother   . Cancer Brother   . Alcohol abuse Father   . Arthritis Sister   . Diabetes Sister   . Multiple sclerosis Sister   . Allergies Other        grandson  . Allergic rhinitis Neg Hx   . Angioedema Neg Hx   . Asthma Neg Hx   . Atopy Neg Hx   . Eczema Neg Hx   . Immunodeficiency Neg Hx   . Urticaria Neg Hx     Review of Systems  Constitutional:  negative for fevers and chills Gastrointestinal: negative for diarrhea Neurological: negative for paresthesia and weakness All other systems reviewed and are negative    Constitutional: in no apparent distress There were no vitals filed for this visit. EYES: anicteric Musculoskeletal: no pedal edema noted Skin: negatives: no rash Neuro: non-focal  Labs: Lab Results  Component Value Date   WBC 6.6 04/13/2019   HGB 13.2 04/13/2019   HCT 41.1 04/13/2019   MCV 74.2 (L) 04/13/2019   PLT 345 04/13/2019    Lab Results  Component Value Date   CREATININE 1.00 04/13/2019   BUN 12 04/13/2019   NA 143 04/13/2019   K 4.5 04/13/2019   CL 106 04/13/2019   CO2 26 04/13/2019    Lab Results  Component Value Date   ALT 7 04/09/2019   AST 13 04/09/2019   ALKPHOS 103 04/09/2019   BILITOT 0.6 04/09/2019   INR 1.17 12/28/2010     Assessment: Discitis/ostoemyelitis.  I discussed the treatment options, process, typical duration of antibiotics.    Plan: 1) disc aspiration for WBCs and Bacterial culture 2) picc line after #1 3) empiric vancomycin and ceftriaxone  Will narrow antibiotics as appropriate

## 2020-01-04 NOTE — Telephone Encounter (Signed)
-----   Message from Pedro Earls, MD sent at 01/04/2020 12:09 PM EDT ----- Regarding: RE: Lumbar disc aspiration Ok for aspiration pending lab results (CBC, PT/INR). Needs to stop xarelto.  ----- Message ----- From: Danielle Dess Sent: 01/04/2020  11:46 AM EDT To: Pedro Earls, MD Subject: Lumbar disc aspiration                         Kat,   Please review for lumbar disc aspiration.   Dx: discitis of lumbosacral region  Imaging: MR lumbar 12/05/19 and CT lumbar 12/29/19  Ordering: Dr. Scharlene Gloss [facility] for Infectious Disease) [phone]  Thanks, Lia Foyer

## 2020-01-05 ENCOUNTER — Ambulatory Visit (INDEPENDENT_AMBULATORY_CARE_PROVIDER_SITE_OTHER): Payer: Medicare Other | Admitting: Podiatry

## 2020-01-05 DIAGNOSIS — B351 Tinea unguium: Secondary | ICD-10-CM

## 2020-01-05 DIAGNOSIS — E1142 Type 2 diabetes mellitus with diabetic polyneuropathy: Secondary | ICD-10-CM

## 2020-01-05 DIAGNOSIS — Q828 Other specified congenital malformations of skin: Secondary | ICD-10-CM | POA: Diagnosis not present

## 2020-01-05 DIAGNOSIS — M79676 Pain in unspecified toe(s): Secondary | ICD-10-CM | POA: Diagnosis not present

## 2020-01-05 LAB — SEDIMENTATION RATE: Sed Rate: 6 mm/h (ref 0–30)

## 2020-01-05 LAB — CBC WITH DIFFERENTIAL/PLATELET
Absolute Monocytes: 506 {cells}/uL (ref 200–950)
Basophils Absolute: 49 {cells}/uL (ref 0–200)
Basophils Relative: 0.8 %
Eosinophils Absolute: 122 {cells}/uL (ref 15–500)
Eosinophils Relative: 2 %
HCT: 38.3 % (ref 35.0–45.0)
Hemoglobin: 12.2 g/dL (ref 11.7–15.5)
Lymphs Abs: 2288 {cells}/uL (ref 850–3900)
MCH: 24 pg — ABNORMAL LOW (ref 27.0–33.0)
MCHC: 31.9 g/dL — ABNORMAL LOW (ref 32.0–36.0)
MCV: 75.2 fL — ABNORMAL LOW (ref 80.0–100.0)
MPV: 9.5 fL (ref 7.5–12.5)
Monocytes Relative: 8.3 %
Neutro Abs: 3135 {cells}/uL (ref 1500–7800)
Neutrophils Relative %: 51.4 %
Platelets: 344 10*3/uL (ref 140–400)
RBC: 5.09 Million/uL (ref 3.80–5.10)
RDW: 16.8 % — ABNORMAL HIGH (ref 11.0–15.0)
Total Lymphocyte: 37.5 %
WBC: 6.1 10*3/uL (ref 3.8–10.8)

## 2020-01-05 LAB — COMPLETE METABOLIC PANEL WITHOUT GFR
AG Ratio: 1.6 (calc) (ref 1.0–2.5)
ALT: 10 U/L (ref 6–29)
AST: 17 U/L (ref 10–35)
Albumin: 4.1 g/dL (ref 3.6–5.1)
Alkaline phosphatase (APISO): 72 U/L (ref 37–153)
BUN/Creatinine Ratio: 8 (calc) (ref 6–22)
BUN: 8 mg/dL (ref 7–25)
CO2: 26 mmol/L (ref 20–32)
Calcium: 9.8 mg/dL (ref 8.6–10.4)
Chloride: 107 mmol/L (ref 98–110)
Creat: 1.04 mg/dL — ABNORMAL HIGH (ref 0.60–0.93)
GFR, Est African American: 62 mL/min/{1.73_m2}
GFR, Est Non African American: 53 mL/min/{1.73_m2} — ABNORMAL LOW
Globulin: 2.5 g/dL (ref 1.9–3.7)
Glucose, Bld: 125 mg/dL — ABNORMAL HIGH (ref 65–99)
Potassium: 4.7 mmol/L (ref 3.5–5.3)
Sodium: 143 mmol/L (ref 135–146)
Total Bilirubin: 0.5 mg/dL (ref 0.2–1.2)
Total Protein: 6.6 g/dL (ref 6.1–8.1)

## 2020-01-05 LAB — HM DIABETES EYE EXAM

## 2020-01-05 LAB — C-REACTIVE PROTEIN: CRP: 4.7 mg/L

## 2020-01-05 NOTE — Telephone Encounter (Signed)
Received call from [place] at IR regarding Lumbar Disc Aspiration. States patient is approved to get scheduled, earliest appointment is 6/29 at 10. Patient will need to arrive at [facility] admissions by 8:30 for check in. Is not to have anything to eat or drink after midnight. Will need to hold off on Xarelto 24 hours before appointment. Patient will need to have someone drive her home after aspiration.  Left voicemail requesting patient call office back for appointment information.  [place]

## 2020-01-05 NOTE — Telephone Encounter (Signed)
Patient returned CMA's call regarding IR appointment. Patient is okay with appointment day/time. Does not have any questions at this time. Will call office if she has any questions come up. Relayed directions from IR to patient.  [place]

## 2020-01-05 NOTE — Progress Notes (Addendum)
She presents today chief complaint of painfully elongated toenails bilaterally.  She is also complaining of numbness in her toes.  Objective: Vital signs are stable she is alert and oriented x3.  Pulses are palpable.  No changes in her neuropathic symptoms.  Toenails are long thick yellow dystrophic-like mycotic.  Assessment: Diabetic peripheral neuropathy with pain in limb secondary to onychomycosis.  Plan: Debridement of toenails 1 through 5 bilaterally.  Follow-up with her in a few weeks

## 2020-01-06 ENCOUNTER — Encounter: Payer: Self-pay | Admitting: Family Medicine

## 2020-01-06 ENCOUNTER — Other Ambulatory Visit: Payer: Self-pay

## 2020-01-06 ENCOUNTER — Ambulatory Visit (INDEPENDENT_AMBULATORY_CARE_PROVIDER_SITE_OTHER): Payer: Medicare Other | Admitting: Family Medicine

## 2020-01-06 VITALS — BP 140/80 | HR 96 | Temp 99.3°F | Ht 64.5 in | Wt 217.2 lb

## 2020-01-06 DIAGNOSIS — R591 Generalized enlarged lymph nodes: Secondary | ICD-10-CM | POA: Diagnosis not present

## 2020-01-06 NOTE — Telephone Encounter (Signed)
Per Dr. Linus Salmons okay to schedule picc appointment same day as aspiration. Left message with Caryl Pina at IR to schedule picc same day. Will update patient once appointment is scheduled. Orders faxed to Short Stay and [facility].  Will need to set up first dose at short stay. [place]

## 2020-01-06 NOTE — Progress Notes (Signed)
Arlyn Buerkle T. Kamiah Fite, MD, [place] at [facility] [name], [address]  Phone: [phone]  FAX: [place] - 74 y.o. female  MRN [id number]  Date of Birth: [birth year]  Date: 01/06/2020  PCP: [name], MD  Referral: [name], MD  Chief Complaint  Patient presents with  . Bump in Vaginal Area    This visit occurred during the SARS-CoV-2 public health emergency.  Safety protocols were in place, including screening questions prior to the visit, additional usage of staff PPE, and extensive cleaning of exam room while observing appropriate contact time as indicated for disinfecting solutions.   Subjective:   [name] is a 74 y.o. very pleasant female patient with Body mass index is 36.71 kg/m. who presents with the following:  No open sore, but pain on palpation lateral to the introitis  No ulcer, no rash Not sexually active  She does not have any pain or urgency with going to the bathroom Her husband has been dead for quite a long time, and she is not sexually active No skin changes or breakdown. No ulcers or any other form a rash.  It is not itchy.  It is minimally painful.  Review of Systems is noted in the HPI, as appropriate  Objective:   BP 140/80   Pulse 96   Temp 99.3 F (37.4 C) (Temporal)   Ht 5' 4.5" (1.638 m)   Wt 217 lb 4 oz (98.5 kg)   SpO2 98%   BMI 36.71 kg/m   GEN: No acute distress; alert,appropriate. PULM: Breathing comfortably in no respiratory distress PSYCH: Normally interactive.   This portion of the physical examination was chaperoned by Hedy Camara, CMA.  On the lateral aspect of the labia majora the patient does have a very small palpable freely mobile area.  Laboratory and Imaging Data:  Assessment and Plan:     ICD-10-CM   1. Lymphadenopathy  R59.1    On exam, consistent with lymphadenopathy.  No  other signs or symptoms.  It is improved over the last 2 days.  Reassurance.  She did have her COVID-19 vaccine 2 days prior to our exam.  Follow-up: No follow-ups on file.  No orders of the defined types were placed in this encounter.  There are no discontinued medications. Orders Placed This Encounter  Procedures  . HM DIABETES EYE EXAM    Signed,  Carole Deere T. Ayden Apodaca, MD   Outpatient Encounter Medications as of 01/06/2020  Medication Sig  . acetaminophen-codeine (TYLENOL #3) 300-30 MG tablet TAKE 1 TABLET BY MOUTH EVERY 12 HOURS AS NEEDED FOR UP TO 7 DAYS FOR MODERATE PAIN (Patient taking differently: Take 2 tablets by mouth every 8 (eight) hours as needed for moderate pain. )  . atorvastatin (LIPITOR) 10 MG tablet Take 1 tablet (10 mg total) by mouth daily.  Marland Kitchen azelastine (ASTELIN) 0.1 % nasal spray USE 1-2 SPRAYS IN EACH NOSTRIL TWICE DAILY AS NEEDED (Patient taking differently: Place 2 sprays into both nostrils 2 (two) times daily as needed for allergies. )  . cyclobenzaprine (FLEXERIL) 10 MG tablet Take 1 tablet (10 mg total) by mouth at bedtime as needed for muscle spasms.  . Empagliflozin-linaGLIPtin (GLYXAMBI) 10-5 MG TABS Take 1 tablet by mouth daily.  Marland Kitchen EPINEPHrine 0.3 mg/0.3 mL IJ SOAJ injection USE AS DIRECTED FOR LIFE THREATENING ALLERGIC REACTIONS (Patient taking differently: Inject 0.3 mg into  the muscle as needed for anaphylaxis. )  . famotidine (PEPCID) 40 MG tablet TAKE 1 TABLET(40 MG) BY MOUTH DAILY (Patient taking differently: Take 40 mg by mouth daily. )  . fluticasone (FLOVENT HFA) 220 MCG/ACT inhaler Inhale two puffs twice daily during flare-up as needed.  Rinse, gargle, and spit after use. (Patient taking differently: Inhale 2 puffs into the lungs 2 (two) times daily as needed (shortness of breath). Rinse, gargle, and spit after use.)  . fluticasone-salmeterol (ADVAIR HFA) 230-21 MCG/ACT inhaler INHALE 2 PUFFS BY MOUTH TWICE DAILY TO PREVENT COUGH OR WHEEZING,  RINSE MOUTH AFTER USE (Patient taking differently: Inhale 2 puffs into the lungs 2 (two) times daily. RINSE MOUTH AFTER USE)  . furosemide (LASIX) 40 MG tablet TAKE 1 TABLET(40 MG) BY MOUTH DAILY (Patient taking differently: Take 40 mg by mouth daily as needed for edema. )  . gabapentin (NEURONTIN) 600 MG tablet TAKE 1 TABLET BY MOUTH DAILY AT BREAKFAST, 1 TABLET AT LUNCH, AND 2 TABLETS AT BEDTIME (Patient taking differently: Take 600-1,200 mg by mouth See admin instructions. Take 600 mg in the morning, 600 mg with lunch, and 1200 mg at night)  . ipratropium (ATROVENT) 0.06 % nasal spray Place 1-2 sprays each nostril 1-2 times per day (Patient taking differently: Place 2 sprays into both nostrils 2 (two) times daily as needed for rhinitis. )  . irbesartan (AVAPRO) 150 MG tablet TAKE 1 TABLET(150 MG) BY MOUTH DAILY (Patient taking differently: Take 150 mg by mouth daily. )  . Lactase (DAIRY-RELIEF PO) Take 1 capsule by mouth daily as needed (eating dairy).   Marland Kitchen levalbuterol (XOPENEX) 1.25 MG/3ML nebulizer solution USE 1 VIAL VIA NEBULIZER EVERY 6 HOURS AS NEEDED FOR WHEEZING OR SHORTNESS OF BREATH (Patient taking differently: Take 1.25 mg by nebulization every 6 (six) hours as needed for wheezing or shortness of breath. )  . magnesium gluconate (MAGONATE) 500 MG tablet Take 500 mg by mouth daily.  . meclizine (ANTIVERT) 12.5 MG tablet Take 1 tablet (12.5 mg total) by mouth 3 (three) times daily as needed for dizziness.  . methimazole (TAPAZOLE) 5 MG tablet Take 5 mg by mouth every other day.  . montelukast (SINGULAIR) 10 MG tablet Take 1 tablet (10 mg total) by mouth daily.  Marland Kitchen nystatin (MYCOSTATIN) 100000 UNIT/ML suspension SWISH AND SWALLOW 5 ML BY MOUTH TWICE DAILY AFTER ADVAIR (Patient taking differently: Take 5 mLs by mouth 2 (two) times daily. Use after advair)  . ondansetron (ZOFRAN-ODT) 4 MG disintegrating tablet Take 4 mg by mouth every 8 (eight) hours as needed for nausea or vomiting.   [name] Delica Lancets 05L MISC Use to check blood sugar up to 2 times a day.  [name] VERIO test strip Use to check blood sugar up to 2 times a day  . oxybutynin (DITROPAN-XL) 5 MG 24 hr tablet Take 5 mg by mouth daily.  . pantoprazole (PROTONIX) 40 MG tablet TAKE 1 TABLET(40 MG) BY MOUTH DAILY (Patient taking differently: Take 40 mg by mouth daily. )  . PATADAY 0.2 % SOLN Place 1 drop into both eyes daily as needed (allergies).   . Potassium 99 MG TABS Take 99 mg by mouth daily.  . Probiotic Product (ALIGN) 4 MG CAPS Take 4 mg by mouth daily.   . rivaroxaban (XARELTO) 20 MG TABS tablet Take 1 tablet (20 mg total) by mouth daily with supper.  . Tiotropium Bromide Monohydrate (SPIRIVA RESPIMAT) 1.25 MCG/ACT AERS Inhale 2 puffs into the lungs daily.  Marland Kitchen  tolterodine (DETROL LA) 2 MG 24 hr capsule Take 2 mg by mouth daily.  Marland Kitchen triamcinolone cream (KENALOG) 0.1 % Apply 1 application topically 2 (two) times daily. (Patient taking differently: Apply 1 application topically 2 (two) times daily as needed (itching). )  . TRULANCE 3 MG TABS Take 3 mg by mouth daily.   . VOLTAREN 1 % GEL Apply 2 g topically 4 (four) times daily. (Patient taking differently: Apply 2 g topically 4 (four) times daily as needed (pain). )  . [DISCONTINUED] Apoaequorin (PREVAGEN PO) Take 1 tablet by mouth daily.  . [DISCONTINUED] clobetasol cream (TEMOVATE) 1.79 % Apply 1 application topically 2 (two) times daily.  . [DISCONTINUED] hydrOXYzine (ATARAX/VISTARIL) 10 MG tablet PRN itching  . [DISCONTINUED] NONFORMULARY OR COMPOUNDED [facility] #11 Peripheral Neuropathy cream 5 refills  Sent in 09-29-19  . [DISCONTINUED] PRESCRIPTION MEDICATION BUP 1%/DOX 3%/GAB 6%/PEN 3%/TOP 1% 30 GM-Apply 1 to 2 pumps to effected are (foot) 3 to 4 times daily as needed.  . [DISCONTINUED] Psyllium (METAMUCIL FIBER PO) Take 2 Scoops by mouth daily.  . [DISCONTINUED] SUMAtriptan (IMITREX) 100 MG tablet Take 100 mg x 1 may, repeat in 2  hour for migraine. (Patient taking differently: Take 100 mg by mouth every 2 (two) hours as needed for migraine. )   Facility-Administered Encounter Medications as of 01/06/2020  Medication  . omalizumab Arvid Right) injection 300 mg

## 2020-01-07 NOTE — Telephone Encounter (Signed)
Spoke with Caryl Pina at [facility] to schedule picc same day as aspiration. Caryl Pina will add picc appt same day.  Updated patient with appointment information. Understands she will be getting first dose of antibiotics at short stay. Patient is okay with this.  Updated [facility] and Short Stay with appointment information. [place]

## 2020-01-08 ENCOUNTER — Telehealth: Payer: Self-pay | Admitting: Cardiovascular Disease

## 2020-01-08 NOTE — Telephone Encounter (Signed)
Spoke with pt, she is going to have a lumbar disc surgery. She was given baclofen to use and wanted to make sure it was okay. Okay given for patient to use baclofen topical ointment.

## 2020-01-08 NOTE — Telephone Encounter (Signed)
Patient states she is about to have a procedure done and she would like to speak to the nurse about it.  Would not go until further detail.

## 2020-01-10 ENCOUNTER — Other Ambulatory Visit: Payer: Self-pay | Admitting: Allergy and Immunology

## 2020-01-11 ENCOUNTER — Other Ambulatory Visit: Payer: Self-pay | Admitting: Radiology

## 2020-01-11 ENCOUNTER — Other Ambulatory Visit (HOSPITAL_COMMUNITY): Payer: Self-pay | Admitting: *Deleted

## 2020-01-11 ENCOUNTER — Other Ambulatory Visit: Payer: Self-pay | Admitting: Student

## 2020-01-12 ENCOUNTER — Telehealth: Payer: Self-pay

## 2020-01-12 ENCOUNTER — Ambulatory Visit: Payer: Self-pay

## 2020-01-12 ENCOUNTER — Other Ambulatory Visit: Payer: Self-pay | Admitting: Internal Medicine

## 2020-01-12 ENCOUNTER — Ambulatory Visit (HOSPITAL_COMMUNITY)
Admission: RE | Admit: 2020-01-12 | Discharge: 2020-01-12 | Disposition: A | Discharge status: Home / Self Care | Payer: Medicare Other | Source: Ambulatory Visit | Attending: Internal Medicine | Admitting: Internal Medicine

## 2020-01-12 ENCOUNTER — Encounter (HOSPITAL_COMMUNITY): Payer: Self-pay

## 2020-01-12 ENCOUNTER — Inpatient Hospital Stay (HOSPITAL_COMMUNITY): Admission: RE | Admit: 2020-01-12 | Payer: Medicare Other | Source: Ambulatory Visit

## 2020-01-12 ENCOUNTER — Other Ambulatory Visit: Payer: Self-pay | Admitting: Radiology

## 2020-01-12 ENCOUNTER — Other Ambulatory Visit: Payer: Self-pay

## 2020-01-12 DIAGNOSIS — Z7982 Long term (current) use of aspirin: Secondary | ICD-10-CM | POA: Diagnosis not present

## 2020-01-12 DIAGNOSIS — I10 Essential (primary) hypertension: Secondary | ICD-10-CM | POA: Insufficient documentation

## 2020-01-12 DIAGNOSIS — M4647 Discitis, unspecified, lumbosacral region: Secondary | ICD-10-CM | POA: Insufficient documentation

## 2020-01-12 DIAGNOSIS — M545 Low back pain: Secondary | ICD-10-CM | POA: Diagnosis not present

## 2020-01-12 DIAGNOSIS — Z833 Family history of diabetes mellitus: Secondary | ICD-10-CM | POA: Insufficient documentation

## 2020-01-12 DIAGNOSIS — Z885 Allergy status to narcotic agent status: Secondary | ICD-10-CM | POA: Insufficient documentation

## 2020-01-12 DIAGNOSIS — Z91040 Latex allergy status: Secondary | ICD-10-CM | POA: Diagnosis not present

## 2020-01-12 DIAGNOSIS — Z86711 Personal history of pulmonary embolism: Secondary | ICD-10-CM | POA: Insufficient documentation

## 2020-01-12 DIAGNOSIS — J45909 Unspecified asthma, uncomplicated: Secondary | ICD-10-CM | POA: Diagnosis not present

## 2020-01-12 DIAGNOSIS — M1990 Unspecified osteoarthritis, unspecified site: Secondary | ICD-10-CM | POA: Insufficient documentation

## 2020-01-12 DIAGNOSIS — Z880 Allergy status to penicillin: Secondary | ICD-10-CM | POA: Diagnosis not present

## 2020-01-12 DIAGNOSIS — Z86718 Personal history of other venous thrombosis and embolism: Secondary | ICD-10-CM | POA: Diagnosis not present

## 2020-01-12 DIAGNOSIS — E119 Type 2 diabetes mellitus without complications: Secondary | ICD-10-CM | POA: Insufficient documentation

## 2020-01-12 DIAGNOSIS — R569 Unspecified convulsions: Secondary | ICD-10-CM | POA: Diagnosis not present

## 2020-01-12 DIAGNOSIS — I272 Pulmonary hypertension, unspecified: Secondary | ICD-10-CM | POA: Insufficient documentation

## 2020-01-12 DIAGNOSIS — Z79899 Other long term (current) drug therapy: Secondary | ICD-10-CM | POA: Diagnosis not present

## 2020-01-12 DIAGNOSIS — Z90711 Acquired absence of uterus with remaining cervical stump: Secondary | ICD-10-CM | POA: Diagnosis not present

## 2020-01-12 HISTORY — PX: IR LUMBAR DISC ASPIRATION W/IMG GUIDE: IMG5306

## 2020-01-12 LAB — GLUCOSE, CAPILLARY: Glucose-Capillary: 109 mg/dL — ABNORMAL HIGH (ref 70–99)

## 2020-01-12 LAB — PROTIME-INR
INR: 1.4 — ABNORMAL HIGH (ref 0.8–1.2)
Prothrombin Time: 16.8 seconds — ABNORMAL HIGH (ref 11.4–15.2)

## 2020-01-12 MED ORDER — VANCOMYCIN HCL 1000 MG IV SOLR: 1000.0000 mg | Freq: Once | INTRAVENOUS | Status: DC

## 2020-01-12 MED ORDER — SODIUM CHLORIDE 0.9 % IV SOLN
INTRAVENOUS | Status: AC | PRN
  Administered 2020-01-12: 10 mL/h via INTRAVENOUS

## 2020-01-12 MED ORDER — SODIUM CHLORIDE 0.9 % IV SOLN
2.0000 g | Freq: Once | INTRAVENOUS | Status: AC
  Administered 2020-01-12: 2 g via INTRAVENOUS
  Filled 2020-01-12: qty 2

## 2020-01-12 MED ORDER — HEPARIN SOD (PORK) LOCK FLUSH 100 UNIT/ML IV SOLN: 250.0000 [IU] | INTRAVENOUS | Status: AC | PRN

## 2020-01-12 MED ORDER — FLUCONAZOLE 150 MG PO TABS
150.0000 mg | ORAL_TABLET | Freq: Once | ORAL | 0 refills | Status: AC
Start: 2020-01-12 — End: 2020-01-12

## 2020-01-12 MED ORDER — FENTANYL CITRATE (PF) 100 MCG/2ML IJ SOLN
INTRAMUSCULAR | Status: AC | PRN
  Administered 2020-01-12 (×2): 25 ug via INTRAVENOUS

## 2020-01-12 MED ORDER — HEPARIN SOD (PORK) LOCK FLUSH 100 UNIT/ML IV SOLN
INTRAVENOUS | Status: AC
  Administered 2020-01-12: 250 [IU]
  Filled 2020-01-12: qty 5

## 2020-01-12 MED ORDER — SODIUM CHLORIDE 0.9 % IV SOLN: INTRAVENOUS | Status: DC

## 2020-01-12 MED ORDER — MIDAZOLAM HCL 2 MG/2ML IJ SOLN
INTRAMUSCULAR | Status: AC | PRN
  Administered 2020-01-12 (×2): 1 mg via INTRAVENOUS

## 2020-01-12 MED ORDER — BUPIVACAINE HCL 0.25 % IJ SOLN
INTRAMUSCULAR | Status: DC | PRN
  Administered 2020-01-12: 15 mL

## 2020-01-12 MED ORDER — HYDROMORPHONE HCL 1 MG/ML IJ SOLN
INTRAMUSCULAR | Status: AC | PRN
  Administered 2020-01-12: 1 mg via INTRAVENOUS

## 2020-01-12 MED ORDER — MIDAZOLAM HCL 2 MG/2ML IJ SOLN
INTRAMUSCULAR | Status: AC
  Filled 2020-01-12: qty 2

## 2020-01-12 MED ORDER — FENTANYL CITRATE (PF) 100 MCG/2ML IJ SOLN
INTRAMUSCULAR | Status: AC
  Filled 2020-01-12: qty 2

## 2020-01-12 MED ORDER — HYDROMORPHONE HCL 1 MG/ML IJ SOLN
INTRAMUSCULAR | Status: AC
  Filled 2020-01-12: qty 1

## 2020-01-12 MED ORDER — LIDOCAINE HCL (PF) 1 % IJ SOLN
INTRAMUSCULAR | Status: AC | PRN
  Administered 2020-01-12: 5 mL via SUBCUTANEOUS

## 2020-01-12 MED ORDER — LIDOCAINE HCL 1 % IJ SOLN
INTRAMUSCULAR | Status: AC
  Filled 2020-01-12: qty 20

## 2020-01-12 MED ORDER — VANCOMYCIN HCL IN DEXTROSE 1-5 GM/200ML-% IV SOLN
1000.0000 mg | Freq: Once | INTRAVENOUS | Status: AC
  Administered 2020-01-12: 1000 mg via INTRAVENOUS
  Filled 2020-01-12: qty 200

## 2020-01-12 MED ORDER — BUPIVACAINE HCL (PF) 0.5 % IJ SOLN
INTRAMUSCULAR | Status: AC
  Filled 2020-01-12: qty 30

## 2020-01-12 NOTE — Discharge Instructions (Signed)
Lumbar ASPIRATION, Care After This sheet gives you information about how to care for yourself after your procedure. Your health care provider may also give you more specific instructions. If you have problems or questions, contact your health care provider. What can I expect after the procedure? After the procedure, it is common to have:  Mild discomfort or pain at the puncture site.  A mild headache that is relieved with pain medicines. Follow these instructions at home: Activity   Lie down flat or rest for as long as directed by your health care provider.  Return to your normal activities as told by your health care provider. Ask your health care provider what activities are safe for you.  Avoid lifting anything heavier than 10 lb (4.5 kg) for at least 12 hours after the procedure.  Do not drive for 24 hours if you were given a medicine to help you relax (sedative) during your procedure.  Do not drive or use heavy machinery while taking prescription pain medicine. Puncture site care  Remove or change your bandage (dressing) as told by your health care provider.  Check your puncture area every day for signs of infection. Check for: ? More pain. ? Redness or swelling. ? Fluid or blood leaking from the puncture site. ? Warmth. ? Pus or a bad smell. General instructions  Take over-the-counter and prescription medicines only as told by your health care provider.  Drink enough fluids to keep your urine clear or pale yellow. Your health care provider may recommend drinking caffeine to prevent a headache.  Keep all follow-up visits as told by your health care provider. This is important. Contact a health care provider if:  You have fever or chills.  You have nausea or vomiting.  You have a headache that lasts for more than 2 days or does not get better with medicine. Get help right away if:  You develop any of the following in your  legs: ? Weakness. ? Numbness. ? Tingling.  You are unable to control when you urinate or have a bowel movement (incontinence).  You have signs of infection around your puncture site, such as: ? More pain. ? Redness or swelling. ? Fluid or blood leakage. ? Warmth. ? Pus or a bad smell.  You are dizzy or you feel like you might faint.  You have a severe headache, especially when you sit or stand. Summary  A lumbar puncture is a procedure in which a small needle is inserted into the lower back to remove fluid that surrounds the brain and spinal cord.  After this procedure, it is common to have a headache and pain around the needle insertion area.  Lying flat, staying hydrated, and drinking caffeine can help prevent headaches.  Monitor your needle insertion site for signs of infection, including warmth, fluid, or more pain.  Get help right away if you develop leg weakness, leg numbness, incontinence, or severe headaches. This information is not intended to replace advice given to you by your health care provider. Make sure you discuss any questions you have with your health care provider. Document Revised: 08/15/2016 Document Reviewed: 08/15/2016 Elsevier Patient Education  2020 [employer] Guide  A peripherally inserted central catheter (PICC) is a form of IV access that allows medicines and IV fluids to be quickly distributed throughout the body. The PICC is a long, thin, flexible tube (catheter) that is inserted into a vein in the upper arm. The catheter ends in a large  vein in the chest (superior vena cava, or SVC). After the PICC is inserted, a chest X-ray may be done to make sure that it is in the correct place. A PICC may be placed for different reasons, such as:  To give medicines and liquid nutrition.  To give IV fluids and blood products.  If there is trouble placing a peripheral intravenous (PIV) catheter. If taken care of properly, a PICC can  remain in place for several months. Having a PICC can also allow a person to go home from the hospital sooner. Medicine and PICC care can be managed at home by a family member, caregiver, or home health care team. What are the risks? Generally, having a PICC is safe. However, problems may occur, including:  A blood clot (thrombus) forming in or at the tip of the PICC.  A blood clot forming in a vein (deep vein thrombosis) or traveling to the lung (pulmonary embolism).  Inflammation of the vein (phlebitis) in which the PICC is placed.  Infection. Central line associated blood stream infection (CLABSI) is a serious infection that often requires hospitalization.  PICC movement (malposition). The PICC tip may move from its original position due to excessive physical activity, forceful coughing, sneezing, or vomiting.  A break or cut in the PICC. It is important not to use scissors near the PICC.  Nerve or tendon irritation or injury during PICC insertion. How to take care of your PICC Preventing problems  You and any caregivers should wash your hands often with soap. Wash hands: ? Before touching the PICC line or the infusion device. ? Before changing a bandage (dressing).  Flush the PICC as told by your health care provider. Let your health care provider know right away if the PICC is hard to flush or does not flush. Do not use force to flush the PICC.  Do not use a syringe that is less than 10 mL to flush the PICC.  Avoid blood pressure checks on the arm in which the PICC is placed.  Never pull or tug on the PICC.  Do not take the PICC out yourself. Only a trained clinical professional should remove the PICC.  Use clean and sterile supplies only. Keep the supplies in a dry place. Do not reuse needles, syringes, or any other supplies. Doing that can lead to infection.  Keep pets and children away from your PICC line.  Check the PICC insertion site every day for signs of infection.  Check for: ? Leakage. ? Redness, swelling, or pain. ? Fluid or blood. ? Warmth. ? Pus or a bad smell. PICC dressing care  Keep your PICC bandage (dressing) clean and dry to prevent infection.  Do not take baths, swim, or use a hot tub until your health care provider approves. Ask your health care provider if you can take showers. You may only be allowed to take sponge baths for bathing. When you are allowed to shower: ? Ask your health care provider to teach you how to wrap the PICC line. ? Cover the PICC line with clear plastic wrap and tape to keep it dry while showering.  Follow instructions from your health care provider about how to take care of your insertion site and dressing. Make sure you: ? Wash your hands with soap and water before you change your bandage (dressing). If soap and water are not available, use hand sanitizer. ? Change your dressing as told by your health care provider. ? Leave stitches (sutures), skin  glue, or adhesive strips in place. These skin closures may need to stay in place for 2 weeks or longer. If adhesive strip edges start to loosen and curl up, you may trim the loose edges. Do not remove adhesive strips completely unless your health care provider tells you to do that.  Change your PICC dressing if it becomes loose or wet. General instructions   Carry your PICC identification card or wear a medical alert bracelet at all times.  Keep the tube clamped at all times, unless it is being used.  Carry a smooth-edge clamp with you at all times to place on the tube if it breaks.  Do not use scissors or sharp objects near the tube.  You may bend your arm and move it freely. If your PICC is near or at the bend of your elbow, avoid activity with repeated motion at the elbow.  Avoid lifting heavy objects as told by your health care provider.  Keep all follow-up visits as told by your health care provider. This is important. Disposal of supplies  Throw away  any syringes in a disposal container that is meant for sharp items (sharps container). You can buy a sharps container from a pharmacy, or you can make one by using an empty hard plastic bottle with a cover.  Place any used dressings or infusion bags into a plastic bag. Throw that bag in the trash. Contact a health care provider if:  You have pain in your arm, ear, face, or teeth.  You have a fever or chills.  You have redness, swelling, or pain around the insertion site.  You have fluid or blood coming from the insertion site.  Your insertion site feels warm to the touch.  You have pus or a bad smell coming from the insertion site.  Your skin feels hard and raised around the insertion site. Get help right away if:  Your PICC is accidentally pulled all the way out. If this happens, cover the insertion site with a bandage or gauze dressing. Do not throw the PICC away. Your health care provider will need to check it.  Your PICC was tugged or pulled and has partially come out. Do not  push the PICC back in.  You cannot flush the PICC, it is hard to flush, or the PICC leaks around the insertion site when it is flushed.  You hear a "flushing" sound when the PICC is flushed.  You feel your heart racing or skipping beats.  There is a hole or tear in the PICC.  You have swelling in the arm in which the PICC was inserted.  You have a red streak going up your arm from where the PICC was inserted. Summary  A peripherally inserted central catheter (PICC) is a long, thin, flexible tube (catheter) that is inserted into a vein in the upper arm.  The PICC is inserted using a sterile technique by a specially trained nurse or physician. Only a trained clinical professional should remove it.  Keep your PICC identification card with you at all times.  Avoid blood pressure checks on the arm in which the PICC is placed.  If cared for properly, a PICC can remain in place for several months.  Having a PICC can also allow a person to go home from the hospital sooner. This information is not intended to replace advice given to you by your health care provider. Make sure you discuss any questions you have with your health care provider.  Document Revised: 06/14/2017 Document Reviewed: 08/04/2016 Elsevier Patient Education  2020 [employer].

## 2020-01-12 NOTE — Addendum Note (Signed)
Addended byEliezer Lofts E on: 01/12/2020 02:03 PM   Modules accepted: Orders

## 2020-01-12 NOTE — Procedures (Signed)
PROCEDURE SUMMARY:  Successful placement of image-guided right single lumen PICC line to the brachial vein. Length 40 cm. Tip at lower SVC/RA. No complications. EBL = <5 ml Ready for use.  Please see imaging section of Epic for full dictation.   Soyla Dryer, [place] [phone] 01/12/2020, 12:20 PM

## 2020-01-12 NOTE — Procedures (Signed)
S/P fluoro guided disc aspiration at L4-L5.  Approx 37ml of thick blood stained aspirate obtained with x 1 pass.  Arlean Hopping Md

## 2020-01-12 NOTE — Telephone Encounter (Signed)
Patient contacted the office and states she believes she has a yeast infection. She states she cannot come in for an appt because she is scheduled for a lumbar procedure today. Patient states she wants to be able to pick the medication up for the yeast infection after her procedure today. Please advise.  [facility] on [address].

## 2020-01-12 NOTE — Telephone Encounter (Signed)
Will send in rx for yeast infection, but if not improving.. needs ap[pt for testing.

## 2020-01-12 NOTE — Telephone Encounter (Signed)
[name] notified as instructed by telephone.  Patient states understanding. 

## 2020-01-12 NOTE — H&P (Addendum)
Chief Complaint: Patient was seen in consultation today for L4-5 and/or L5-S1 disc aspiration at the request of Comer,Robert W  Referring Physician(s): [place] Physician: Luanne Bras  Patient Status: [facility] - Out-pt  History of Present Illness: [name] is a 74 y.o. female   Has had progressive back pain for at least a year. meds without relief Back pain worsened recently- radiates to left leg Now even both hips No numbness/tingling   Imaging 12/29/19: IMPRESSION: Findings worrisome for discitis and osteomyelitis at L4-5 and L5-S1 as reported on prior MRI.  Pt sees Dr Linus Salmons ID [id number] Note: Assessment: Discitis/ostoemyelitis.  I discussed the treatment options, process, typical duration of antibiotics.   Plan: 1) disc aspiration for WBCs and Bacterial culture 2) picc line after #1 3) empiric vancomycin and ceftriaxone  Will narrow antibiotics as appropriate  Pt is scheduled for L4-5 and/or L5-S1 disc aspiration And PICC placement  LD Xarelto Sun 6/27  Past Medical History:  Diagnosis Date  . Allergic rhinitis   . Allergy   . Arthritis   . Asthma   . Chronic headache   . Diabetes mellitus   . DVT (deep venous thrombosis) ([place])   . Dyspnea   . Heart murmur   . Hypertension   . Pulmonary embolism ([place])   . Pulmonary hypertension ([place])   . Seizures ([place])     Past Surgical History:  Procedure Laterality Date  . ABDOMINAL HYSTERECTOMY     partial, has ovaries  . CARDIAC CATHETERIZATION  12/28/2010   Mod. pulmonary hypertension, normal coronary arteries  . DOPPLER ECHOCARDIOGRAPHY  10/08/2011   EF=>55%,mild asymmetric LVH, mod. TR, mod. PH, mild to mod LA dilatation  . KNEE ARTHROSCOPY Left   . KNEE SURGERY    . Nuclear Stress Test  05/20/2006   No ischemia  . PARTIAL HYSTERECTOMY    . PLANTAR FASCIA SURGERY    . TONSILLECTOMY      Allergies: Latex, Penicillins, Lyrica [pregabalin], Phenytoin sodium extended, and  Vicodin [hydrocodone-acetaminophen]  Medications: Prior to Admission medications   Medication Sig Start Date End Date Taking? Authorizing Provider  acetaminophen (TYLENOL) 650 MG CR tablet Take 1,300 mg by mouth daily as needed for pain.   Yes [provider]  acetaminophen-codeine (TYLENOL #3) 300-30 MG tablet TAKE 1 TABLET BY MOUTH EVERY 12 HOURS AS NEEDED FOR UP TO 7 DAYS FOR MODERATE PAIN Patient taking differently: Take 2 tablets by mouth every 8 (eight) hours as needed for moderate pain.  10/13/19  Yes Bedsole, Amy E, MD  ADVAIR HFA 230-21 MCG/ACT inhaler INHALE 2 PUFFS BY MOUTH TWICE DAILY TO PREVENT COUGH OR WHEEZING, RINSE MOUTH AFTER USE 01/11/20  Yes Kozlow, Donnamarie Poag, MD  antiseptic oral rinse (BIOTENE) LIQD 15 mLs by Mouth Rinse route in the morning and at bedtime.   Yes [provider]  Apoaequorin (PREVAGEN) 10 MG CAPS Take 10 mg by mouth daily.   Yes [provider]  Artificial Saliva (BIOTENE DRY MOUTH) LOZG Use as directed 1 lozenge in the mouth or throat daily as needed (dry eyes).   Yes [provider]  atorvastatin (LIPITOR) 10 MG tablet Take 1 tablet (10 mg total) by mouth daily. 11/03/19  Yes Bedsole, Amy E, MD  azelastine (ASTELIN) 0.1 % nasal spray USE 1-2 SPRAYS IN EACH NOSTRIL TWICE DAILY AS NEEDED Patient taking differently: Place 2 sprays into both nostrils 2 (two) times daily as needed for allergies.  11/24/19  Yes Kozlow, Donnamarie Poag,  MD  Boswellia Serrata (BOSWELLIA PO) Take 1 tablet by mouth daily.   Yes [provider]  Capsaicin-Menthol (SALONPAS GEL EX) Apply 1 application topically daily as needed (pain).   Yes [provider]  Coenzyme Q10 (COQ-10) 100 MG CAPS Take 100 mg by mouth daily.   Yes [provider]  cyclobenzaprine (FLEXERIL) 10 MG tablet Take 1 tablet (10 mg total) by mouth at bedtime as needed for muscle spasms. 12/11/18  Yes Bedsole, Amy E, MD  dextromethorphan-guaiFENesin (MUCINEX DM) 30-600 MG  12hr tablet Take 1 tablet by mouth 2 (two) times daily as needed for cough.   Yes [provider]  Empagliflozin-linaGLIPtin (GLYXAMBI) 10-5 MG TABS Take 1 tablet by mouth daily. 08/04/19  Yes Bedsole, Amy E, MD  famotidine (PEPCID) 40 MG tablet TAKE 1 TABLET(40 MG) BY MOUTH DAILY Patient taking differently: Take 40 mg by mouth daily.  11/24/19  Yes Kozlow, Donnamarie Poag, MD  fluticasone (FLOVENT HFA) 220 MCG/ACT inhaler Inhale two puffs twice daily during flare-up as needed.  Rinse, gargle, and spit after use. Patient taking differently: Inhale 2 puffs into the lungs 2 (two) times daily as needed (shortness of breath). Rinse, gargle, and spit after use. 02/09/19  Yes Kozlow, Donnamarie Poag, MD  gabapentin (NEURONTIN) 600 MG tablet TAKE 1 TABLET BY MOUTH DAILY AT BREAKFAST, 1 TABLET AT LUNCH, AND 2 TABLETS AT BEDTIME Patient taking differently: Take 600-1,200 mg by mouth See admin instructions. Take 600 mg in the morning, 600 mg with lunch, and 1200 mg at night 11/16/19  Yes Hyatt, Max T, DPM  Garlic (GARLIQUE PO) Take 1 tablet by mouth daily.   Yes [provider]  Homeopathic Products [facility] RELIEF EX) Apply 1 spray topically daily as needed (muscle cramps).   Yes [provider]  ipratropium (ATROVENT) 0.06 % nasal spray Place 1-2 sprays each nostril 1-2 times per day Patient taking differently: Place 2 sprays into both nostrils 2 (two) times daily as needed for rhinitis.  11/24/19  Yes Kozlow, Donnamarie Poag, MD  irbesartan (AVAPRO) 150 MG tablet TAKE 1 TABLET(150 MG) BY MOUTH DAILY Patient taking differently: Take 150 mg by mouth daily.  07/06/19  Yes Croitoru, Mihai, MD  Lactase (DAIRY-RELIEF PO) Take 1 capsule by mouth daily as needed (eating dairy).    Yes [provider]  levalbuterol (XOPENEX) 1.25 MG/3ML nebulizer solution USE 1 VIAL VIA NEBULIZER EVERY 6 HOURS AS NEEDED FOR WHEEZING OR SHORTNESS OF BREATH Patient taking differently: Take 1.25 mg by nebulization every 6 (six)  hours as needed for wheezing or shortness of breath.  12/03/19  Yes Kozlow, Donnamarie Poag, MD  magnesium gluconate (MAGONATE) 500 MG tablet Take 500 mg by mouth daily.   Yes [provider]  methimazole (TAPAZOLE) 5 MG tablet Take 5 mg by mouth every other day.   Yes [provider]  Misc Natural Products (GLUCOSAMINE CHONDROITIN TRIPLE) TABS Take 2 tablets by mouth daily.   Yes [provider]  montelukast (SINGULAIR) 10 MG tablet Take 1 tablet (10 mg total) by mouth daily. 11/24/19  Yes Kozlow, Donnamarie Poag, MD  nystatin (MYCOSTATIN) 100000 UNIT/ML suspension SWISH AND SWALLOW 5 ML BY MOUTH TWICE DAILY AFTER ADVAIR Patient taking differently: Take 5 mLs by mouth 2 (two) times daily. Use after advair 11/24/19  Yes Kozlow, Donnamarie Poag, MD  omalizumab Arvid Right) 150 MG injection Inject 300 mg into the skin every 28 (twenty-eight) days.   Yes [provider]  ondansetron (ZOFRAN-ODT) 4 MG disintegrating tablet  Take 4 mg by mouth every 8 (eight) hours as needed for nausea or vomiting.  07/07/19  Yes [provider]  oxybutynin (DITROPAN-XL) 5 MG 24 hr tablet Take 5 mg by mouth daily. 09/18/19  Yes [provider]  pantoprazole (PROTONIX) 40 MG tablet TAKE 1 TABLET(40 MG) BY MOUTH DAILY Patient taking differently: Take 40 mg by mouth daily.  11/24/19  Yes Kozlow, Donnamarie Poag, MD  PATADAY 0.2 % SOLN Place 1 drop into both eyes daily as needed (allergies).  01/27/17  Yes [provider]  Polyethyl Glycol-Propyl Glycol (SYSTANE OP) Place 1 drop into both eyes 2 (two) times daily.   Yes [provider]  Potassium 99 MG TABS Take 99 mg by mouth daily.   Yes [provider]  Probiotic Product (ALIGN) 4 MG CAPS Take 4 mg by mouth daily.    Yes [provider]  Pumpkin Seed-Soy Germ (AZO BLADDER CONTROL/GO-LESS) CAPS Take 1 tablet by mouth 2 (two) times daily.   Yes [provider]  rivaroxaban (XARELTO) 20 MG TABS tablet Take 1 tablet (20 mg  total) by mouth daily with supper. 08/31/19  Yes Croitoru, Mihai, MD  Tiotropium Bromide Monohydrate (SPIRIVA RESPIMAT) 1.25 MCG/ACT AERS Inhale 2 puffs into the lungs daily. 11/24/19  Yes Kozlow, Donnamarie Poag, MD  tolterodine (DETROL LA) 2 MG 24 hr capsule Take 2 mg by mouth daily.   Yes [provider]  triamcinolone cream (KENALOG) 0.1 % Apply 1 application topically 2 (two) times daily. Patient taking differently: Apply 1 application topically 2 (two) times daily as needed (itching).  03/10/19  Yes Bedsole, Amy E, MD  TRULANCE 3 MG TABS Take 3 mg by mouth daily.  12/06/18  Yes [provider]  TURMERIC PO Take 1,000 mg by mouth daily.   Yes [provider]  VOLTAREN 1 % GEL Apply 2 g topically 4 (four) times daily. Patient taking differently: Apply 2 g topically 4 (four) times daily as needed (pain).  11/12/19  Yes Bedsole, Amy E, MD  EPINEPHrine 0.3 mg/0.3 mL IJ SOAJ injection USE AS DIRECTED FOR LIFE THREATENING ALLERGIC REACTIONS Patient taking differently: Inject 0.3 mg into the muscle as needed for anaphylaxis.  11/24/19   Kozlow, Donnamarie Poag, MD  famotidine (PEPCID) 20 MG tablet Take 20 mg by mouth daily as needed (gas).    [provider]  furosemide (LASIX) 40 MG tablet TAKE 1 TABLET(40 MG) BY MOUTH DAILY Patient taking differently: Take 40 mg by mouth daily as needed for edema.  07/28/18   Croitoru, Mihai, MD  meclizine (ANTIVERT) 12.5 MG tablet Take 1 tablet (12.5 mg total) by mouth 3 (three) times daily as needed for dizziness. 04/08/19   Wardell Honour, MD  OneTouch Delica Lancets 53Z MISC Use to check blood sugar up to 2 times a day. 10/20/19   [name], MD  [facility] VERIO test strip Use to check blood sugar up to 2 times a day 10/20/19   [name], MD     Family History  Problem Relation Age of Onset  . Hypertension Mother   . Clotting disorder Mother   . Breast cancer Mother   . Arthritis Mother   . Stroke Mother   . Diabetes Mother   . Cancer  Brother   . Alcohol abuse Father   . Arthritis Sister   . Diabetes Sister   . Multiple sclerosis Sister   . Allergies Other        grandson  .  Allergic rhinitis Neg Hx   . Angioedema Neg Hx   . Asthma Neg Hx   . Atopy Neg Hx   . Eczema Neg Hx   . Immunodeficiency Neg Hx   . Urticaria Neg Hx     Social History   Socioeconomic History  . Marital status: Widowed    Spouse name: Not on file  . Number of children: Y  . Years of education: Not on file  . Highest education level: Not on file  Occupational History  . Occupation: retired Geologist, engineering.   Tobacco Use  . Smoking status: Never Smoker  . Smokeless tobacco: Never Used  Vaping Use  . Vaping Use: Never used  Substance and Sexual Activity  . Alcohol use: Not Currently    Alcohol/week: 0.0 standard drinks    Comment: occ glass on wine  . Drug use: No  . Sexual activity: Never  Other Topics Concern  . Not on file  Social History Narrative   Widow    limited exercise.   Social Determinants of Health   Financial Resource Strain: Low Risk   . Difficulty of Paying Living Expenses: Not hard at all  Food Insecurity:   . Worried About Charity fundraiser in the Last Year:   . Arboriculturist in the Last Year:   Transportation Needs:   . Film/video editor (Medical):   Marland Kitchen Lack of Transportation (Non-Medical):   Physical Activity:   . Days of Exercise per Week:   . Minutes of Exercise per Session:   Stress:   . Feeling of Stress :   Social Connections:   . Frequency of Communication with Friends and Family:   . Frequency of Social Gatherings with Friends and Family:   . Attends Religious Services:   . Active Member of Clubs or Organizations:   . Attends Archivist Meetings:   Marland Kitchen Marital Status:    Review of Systems: A 12 point ROS discussed and pertinent positives are indicated in the HPI above.  All other systems are negative.  Review of Systems  Constitutional: Positive for activity change.  Negative for fatigue and fever.  Respiratory: Negative for shortness of breath.   Cardiovascular: Negative for chest pain.  Gastrointestinal: Negative for abdominal pain.  Musculoskeletal: Positive for back pain.  Neurological: Negative for weakness.  Psychiatric/Behavioral: Negative for behavioral problems and confusion.    Vital Signs: BP 134/78   Pulse 70   Temp 98.2 F (36.8 C) (Oral)   Resp 16   Ht 5\' 5"  (1.651 m)   Wt 210 lb (95.3 kg)   SpO2 98%   BMI 34.95 kg/m   Physical Exam Vitals reviewed.  Cardiovascular:     Rate and Rhythm: Normal rate and regular rhythm.     Heart sounds: Normal heart sounds.  Pulmonary:     Effort: Pulmonary effort is normal.     Breath sounds: Normal breath sounds.  Abdominal:     Palpations: Abdomen is soft.  Musculoskeletal:        General: Tenderness present. No swelling, deformity or signs of injury. Normal range of motion.     Right lower leg: No edema.     Left lower leg: No edema.     Comments: Low back pain to hips  Skin:    General: Skin is warm.  Neurological:     Mental Status: She is alert and oriented to person, place, and time.  Psychiatric:  Behavior: Behavior normal.     Imaging: CT LUMBAR SPINE WO CONTRAST  Result Date: 12/29/2019 CLINICAL DATA:  Low back pain radiating into both hips. History of discitis and osteomyelitis at L4-5 and L5-S1. EXAM: CT LUMBAR SPINE WITHOUT CONTRAST TECHNIQUE: Multidetector CT imaging of the lumbar spine was performed without intravenous contrast administration. Multiplanar CT image reconstructions were also generated. COMPARISON:  Postmyelogram CT lumbar spine 04/10/2018. MRI lumbar spine 12/05/2019. FINDINGS: Segmentation: Standard. Alignment: 0.3 cm anterolisthesis L4 on L5 is unchanged. Vertebrae: No fracture is identified. There is severe endplate sclerosis at J0-0 and L5-S1 with irregularity of the endplates at both levels worrisome for discitis and osteomyelitis as seen on  prior MRI. Sclerosis and irregularity of the endplates are much worse than on the prior CT. Also seen is a Schmorl's node in the inferior endplate of L1 and marked sclerosis in the endplate, worse since the priors CT. Paraspinal and other soft tissues: Mild soft tissue thickening is present about the L4-5 and L5-S1 disc interspaces. Presacral edema is much better visualized on the prior MRI. Atherosclerosis is noted. Disc levels: T12-L1: Negative. L1-2: Shallow disc bulge and mild facet degenerative change without stenosis. L2-3: Shallow disc bulge and mild-to-moderate facet degenerative change without stenosis. L3-4: Shallow disc bulge, ligamentum flavum thickening and mild to moderate facet arthropathy without stenosis. L4-5: Loss of disc space height, ligamentum flavum thickening and facet degenerative change. Moderate central canal and subarticular recess narrowing. Moderate bilateral foraminal narrowing. The appearance is unchanged since the prior MRI. L5-S1: Bilateral facet degenerative change. Mild central canal and bilateral lateral recess narrowing again seen. Moderate bilateral foraminal narrowing is also unchanged. IMPRESSION: Findings worrisome for discitis and osteomyelitis at L4-5 and L5-S1 as reported on prior MRI. No change in lumbar spondylosis since the prior MRI. Electronically Signed   By: Inge Rise M.D.   On: 12/29/2019 13:59    Labs:  CBC: Recent Labs    04/13/19 1600 01/04/20 1059  WBC 6.6 6.1  HGB 13.2 12.2  HCT 41.1 38.3  PLT 345 344    COAGS: No results for input(s): INR, APTT in the last 8760 hours.  BMP: Recent Labs    04/09/19 1332 04/13/19 1600 01/04/20 1059  NA 143 143 143  K 4.4 4.5 4.7  CL 107 106 107  CO2 28 26 26   GLUCOSE 111* 111* 125*  BUN 10 12 8   CALCIUM 9.4 9.2 9.8  CREATININE 0.94 1.00 1.04*  GFRNONAA  --  56* 53*  GFRAA  --  >60 62    LIVER FUNCTION TESTS: Recent Labs    04/09/19 1332 01/04/20 1059  BILITOT 0.6 0.5  AST 13 17   ALT 7 10  ALKPHOS 103  --   PROT 6.6 6.6  ALBUMIN 4.1  --     TUMOR MARKERS: No results for input(s): AFPTM, CEA, CA199, CHROMGRNA in the last 8760 hours.  Assessment and Plan:  Lumbar 4-5 and/or Lumbar 5-Sacral 1 disc aspiration Also scheduled for Peripherally inserted central catheter placement (to get first dose today in [facility]) Risks and benefits of disc aspiration was discussed with the patient and/or patient's family including, but not limited to bleeding, infection, damage to adjacent structures or low yield requiring additional tests.  All of the questions were answered and there is agreement to proceed. Consent signed and in chart.   Thank you for this interesting consult.  I greatly enjoyed [place] and look forward to participating in their care.  A copy  of this report was sent to the requesting provider on this date.  Electronically Signed: Lavonia Drafts, PA-C 01/12/2020, 9:10 AM   I spent a total of  30 Minutes   in face to face in clinical consultation, greater than 50% of which was counseling/coordinating care for L4-5 and/or L5-S1 disc aspiration and PICC placement

## 2020-01-13 ENCOUNTER — Telehealth: Payer: Self-pay | Admitting: Family Medicine

## 2020-01-13 ENCOUNTER — Other Ambulatory Visit: Payer: Self-pay | Admitting: Internal Medicine

## 2020-01-13 DIAGNOSIS — H9313 Tinnitus, bilateral: Secondary | ICD-10-CM | POA: Diagnosis not present

## 2020-01-13 DIAGNOSIS — I10 Essential (primary) hypertension: Secondary | ICD-10-CM | POA: Diagnosis not present

## 2020-01-13 DIAGNOSIS — J45909 Unspecified asthma, uncomplicated: Secondary | ICD-10-CM | POA: Diagnosis not present

## 2020-01-13 DIAGNOSIS — M4647 Discitis, unspecified, lumbosacral region: Secondary | ICD-10-CM

## 2020-01-13 DIAGNOSIS — Z5181 Encounter for therapeutic drug level monitoring: Secondary | ICD-10-CM | POA: Diagnosis not present

## 2020-01-13 DIAGNOSIS — Z9181 History of falling: Secondary | ICD-10-CM | POA: Diagnosis not present

## 2020-01-13 DIAGNOSIS — Z8673 Personal history of transient ischemic attack (TIA), and cerebral infarction without residual deficits: Secondary | ICD-10-CM | POA: Diagnosis not present

## 2020-01-13 DIAGNOSIS — Z792 Long term (current) use of antibiotics: Secondary | ICD-10-CM | POA: Diagnosis not present

## 2020-01-13 DIAGNOSIS — Z86718 Personal history of other venous thrombosis and embolism: Secondary | ICD-10-CM | POA: Diagnosis not present

## 2020-01-13 DIAGNOSIS — Z602 Problems related to living alone: Secondary | ICD-10-CM | POA: Diagnosis not present

## 2020-01-13 DIAGNOSIS — I272 Pulmonary hypertension, unspecified: Secondary | ICD-10-CM | POA: Diagnosis not present

## 2020-01-13 DIAGNOSIS — R519 Headache, unspecified: Secondary | ICD-10-CM | POA: Diagnosis not present

## 2020-01-13 DIAGNOSIS — Z7901 Long term (current) use of anticoagulants: Secondary | ICD-10-CM | POA: Diagnosis not present

## 2020-01-13 DIAGNOSIS — Z86711 Personal history of pulmonary embolism: Secondary | ICD-10-CM | POA: Diagnosis not present

## 2020-01-13 DIAGNOSIS — Z452 Encounter for adjustment and management of vascular access device: Secondary | ICD-10-CM | POA: Diagnosis not present

## 2020-01-13 DIAGNOSIS — E119 Type 2 diabetes mellitus without complications: Secondary | ICD-10-CM | POA: Diagnosis not present

## 2020-01-13 DIAGNOSIS — G40909 Epilepsy, unspecified, not intractable, without status epilepticus: Secondary | ICD-10-CM | POA: Diagnosis not present

## 2020-01-13 DIAGNOSIS — E1140 Type 2 diabetes mellitus with diabetic neuropathy, unspecified: Secondary | ICD-10-CM

## 2020-01-13 DIAGNOSIS — M159 Polyosteoarthritis, unspecified: Secondary | ICD-10-CM | POA: Diagnosis not present

## 2020-01-13 NOTE — Telephone Encounter (Signed)
That would be fine. I have ordered the labs to be drawn by infusion nurse.

## 2020-01-13 NOTE — Telephone Encounter (Signed)
Patient stated that she is having infusions twice a week She stated that on Friday she will have labs drawn with the nurse there. She wanted to know if she could cancel her lab appt on 7/6 and have them done when the nurse does her labs at her infusion

## 2020-01-13 NOTE — Telephone Encounter (Signed)
[name] notified as instructed by telephone.  Lab appointment scheduled for 01/19/2020 has been cancelled.

## 2020-01-14 ENCOUNTER — Telehealth: Payer: Self-pay

## 2020-01-14 NOTE — Telephone Encounter (Signed)
I received a call from [place] with [facility]. She states that Dr. Linus Salmons with infectious disease is overseeing patient's home health orders. She states there are many drug interactions and other issues that need to would be better addressed by the PCP. She is wondering if Dr. Diona Browner will take over? Please advise.

## 2020-01-15 DIAGNOSIS — Z452 Encounter for adjustment and management of vascular access device: Secondary | ICD-10-CM | POA: Diagnosis not present

## 2020-01-15 DIAGNOSIS — Z5181 Encounter for therapeutic drug level monitoring: Secondary | ICD-10-CM | POA: Diagnosis not present

## 2020-01-15 DIAGNOSIS — I10 Essential (primary) hypertension: Secondary | ICD-10-CM | POA: Diagnosis not present

## 2020-01-15 DIAGNOSIS — I272 Pulmonary hypertension, unspecified: Secondary | ICD-10-CM | POA: Diagnosis not present

## 2020-01-15 DIAGNOSIS — M4627 Osteomyelitis of vertebra, lumbosacral region: Secondary | ICD-10-CM | POA: Diagnosis not present

## 2020-01-15 DIAGNOSIS — H9313 Tinnitus, bilateral: Secondary | ICD-10-CM | POA: Diagnosis not present

## 2020-01-15 DIAGNOSIS — M4647 Discitis, unspecified, lumbosacral region: Secondary | ICD-10-CM | POA: Diagnosis not present

## 2020-01-15 DIAGNOSIS — E119 Type 2 diabetes mellitus without complications: Secondary | ICD-10-CM | POA: Diagnosis not present

## 2020-01-15 NOTE — Telephone Encounter (Signed)
Diane notified by telephone that Dr. Diona Browner is agreeable in doing this.

## 2020-01-15 NOTE — Telephone Encounter (Signed)
Yes I am agreeable

## 2020-01-17 ENCOUNTER — Other Ambulatory Visit: Payer: Self-pay | Admitting: Internal Medicine

## 2020-01-17 LAB — AEROBIC/ANAEROBIC CULTURE W GRAM STAIN (SURGICAL/DEEP WOUND)
Culture: NO GROWTH
Gram Stain: NONE SEEN

## 2020-01-19 ENCOUNTER — Telehealth: Payer: Self-pay | Admitting: Family Medicine

## 2020-01-19 ENCOUNTER — Telehealth: Payer: Self-pay

## 2020-01-19 ENCOUNTER — Other Ambulatory Visit: Payer: Medicare Other

## 2020-01-19 ENCOUNTER — Encounter: Payer: Self-pay | Admitting: Infectious Disease

## 2020-01-19 ENCOUNTER — Ambulatory Visit (INDEPENDENT_AMBULATORY_CARE_PROVIDER_SITE_OTHER): Payer: Medicare Other | Admitting: Infectious Disease

## 2020-01-19 ENCOUNTER — Other Ambulatory Visit: Payer: Self-pay

## 2020-01-19 VITALS — BP 150/111 | HR 96 | Wt 217.3 lb

## 2020-01-19 DIAGNOSIS — Z880 Allergy status to penicillin: Secondary | ICD-10-CM

## 2020-01-19 DIAGNOSIS — I272 Pulmonary hypertension, unspecified: Secondary | ICD-10-CM | POA: Diagnosis not present

## 2020-01-19 DIAGNOSIS — M4647 Discitis, unspecified, lumbosacral region: Secondary | ICD-10-CM | POA: Diagnosis not present

## 2020-01-19 DIAGNOSIS — I10 Essential (primary) hypertension: Secondary | ICD-10-CM | POA: Diagnosis not present

## 2020-01-19 DIAGNOSIS — E1140 Type 2 diabetes mellitus with diabetic neuropathy, unspecified: Secondary | ICD-10-CM

## 2020-01-19 DIAGNOSIS — H9313 Tinnitus, bilateral: Secondary | ICD-10-CM | POA: Diagnosis not present

## 2020-01-19 DIAGNOSIS — J4550 Severe persistent asthma, uncomplicated: Secondary | ICD-10-CM | POA: Diagnosis not present

## 2020-01-19 DIAGNOSIS — Z452 Encounter for adjustment and management of vascular access device: Secondary | ICD-10-CM | POA: Diagnosis not present

## 2020-01-19 DIAGNOSIS — E119 Type 2 diabetes mellitus without complications: Secondary | ICD-10-CM | POA: Diagnosis not present

## 2020-01-19 HISTORY — DX: Allergy status to penicillin: Z88.0

## 2020-01-19 NOTE — Telephone Encounter (Signed)
Can she receive the injections in the opposite arm? I.e. both Xolair injections in one arm?

## 2020-01-19 NOTE — Telephone Encounter (Signed)
Patient had back surgery 01-12-20. She is currently on two IV antibiotics and IV heparin. She will be on all of these until at least 02-23-20. She is supposed to get her Xolair tomorrow. Please advise on further instructions and thank you.

## 2020-01-19 NOTE — Telephone Encounter (Signed)
She can come in for her Xolair injection on those 2 medications.

## 2020-01-19 NOTE — Progress Notes (Signed)
Subjective:  Chief complaint low back pain  Patient ID: [name], female    DOB: 1945-10-08, 74 y.o.   MRN: [id number]  HPI  [name] is a 74 year old African-American lady with diabetes mellitus, history of seizures who was found to have discitis osteomyelitis of the L4 S1 region.  She was seen by my partner Dr. Linus Salmons on June 21.  Note the original MRI was done in May but she had missed a couple of appointments to establish care with [place].  She had a disc aspirate performed after that visit off antibiotics and cultures were unrevealing.  She was initiated on vancomycin and ceftriaxone on June 30.  Her initial vancomycin trough on July 2 was 9.  Her inflammatory markers in the clinic were normal.  Pain has not changed much yet she says it is at times 8-9 out of 10 in severity particular when she gets up also is at times and she sits the wrong way and when she changes position in bed sometimes if she lies still completely flat on the bed she can be pain-free.  She does have some radicular symptoms with numbness and pain going down her legs.  No weakness.   Past Medical History:  Diagnosis Date  . Allergic rhinitis   . Allergy   . Arthritis   . Asthma   . Chronic headache   . Diabetes mellitus   . DVT (deep venous thrombosis) ([place])   . Dyspnea   . Heart murmur   . Hypertension   . Pulmonary embolism ([place])   . Pulmonary hypertension ([place])   . Seizures ([place])     Past Surgical History:  Procedure Laterality Date  . ABDOMINAL HYSTERECTOMY     partial, has ovaries  . CARDIAC CATHETERIZATION  12/28/2010   Mod. pulmonary hypertension, normal coronary arteries  . DOPPLER ECHOCARDIOGRAPHY  10/08/2011   EF=>55%,mild asymmetric LVH, mod. TR, mod. PH, mild to mod LA dilatation  . IR LUMBAR DISC ASPIRATION W/IMG GUIDE  01/12/2020  . KNEE ARTHROSCOPY Left   . KNEE SURGERY    . Nuclear Stress Test  05/20/2006   No ischemia  . PARTIAL HYSTERECTOMY    . PLANTAR FASCIA SURGERY    .  TONSILLECTOMY      Family History  Problem Relation Age of Onset  . Hypertension Mother   . Clotting disorder Mother   . Breast cancer Mother   . Arthritis Mother   . Stroke Mother   . Diabetes Mother   . Cancer Brother   . Alcohol abuse Father   . Arthritis Sister   . Diabetes Sister   . Multiple sclerosis Sister   . Allergies Other        grandson  . Allergic rhinitis Neg Hx   . Angioedema Neg Hx   . Asthma Neg Hx   . Atopy Neg Hx   . Eczema Neg Hx   . Immunodeficiency Neg Hx   . Urticaria Neg Hx       Social History   Socioeconomic History  . Marital status: Widowed    Spouse name: Not on file  . Number of children: Y  . Years of education: Not on file  . Highest education level: Not on file  Occupational History  . Occupation: retired Geologist, engineering.   Tobacco Use  . Smoking status: Never Smoker  . Smokeless tobacco: Never Used  Vaping Use  . Vaping Use: Never used  Substance and Sexual Activity  . Alcohol use:  Not Currently    Alcohol/week: 0.0 standard drinks    Comment: occ glass on wine  . Drug use: No  . Sexual activity: Never  Other Topics Concern  . Not on file  Social History Narrative   Widow    limited exercise.   Social Determinants of Health   Financial Resource Strain: Low Risk   . Difficulty of Paying Living Expenses: Not hard at all  Food Insecurity:   . Worried About Charity fundraiser in the Last Year:   . Arboriculturist in the Last Year:   Transportation Needs:   . Film/video editor (Medical):   Marland Kitchen Lack of Transportation (Non-Medical):   Physical Activity:   . Days of Exercise per Week:   . Minutes of Exercise per Session:   Stress:   . Feeling of Stress :   Social Connections:   . Frequency of Communication with Friends and Family:   . Frequency of Social Gatherings with Friends and Family:   . Attends Religious Services:   . Active Member of Clubs or Organizations:   . Attends Archivist Meetings:   Marland Kitchen  Marital Status:     Allergies  Allergen Reactions  . Latex Hives  . Penicillins Swelling    Has patient had a PCN reaction causing immediate rash, facial/tongue/throat swelling, SOB or lightheadedness with hypotension patient had a PCN reaction causing severe rash involving mucus membranes or skin necrosis: [id number]} Has patient had a PCN reaction that required hospitalization/No Has patient had a PCN reaction occurring within the last 10 years: No If all of the above answers are "NO", then may proceed with Cephalosporin use.     Recardo Evangelist [Pregabalin] Other (See Comments)    Blurred vision.  Marland Kitchen Phenytoin Sodium Extended Swelling  . Vicodin [Hydrocodone-Acetaminophen] Nausea And Vomiting     Current Outpatient Medications:  .  acetaminophen (TYLENOL) 650 MG CR tablet, Take 1,300 mg by mouth daily as needed for pain., Disp: , Rfl:  .  acetaminophen-codeine (TYLENOL #3) 300-30 MG tablet, TAKE 1 TABLET BY MOUTH EVERY 12 HOURS AS NEEDED FOR UP TO 7 DAYS FOR MODERATE PAIN (Patient taking differently: Take 2 tablets by mouth every 8 (eight) hours as needed for moderate pain. ), Disp: 60 tablet, Rfl: 0 .  ADVAIR HFA 230-21 MCG/ACT inhaler, INHALE 2 PUFFS BY MOUTH TWICE DAILY TO PREVENT COUGH OR WHEEZING, RINSE MOUTH AFTER USE, Disp: 12 g, Rfl: 4 .  antiseptic oral rinse (BIOTENE) LIQD, 15 mLs by Mouth Rinse route in the morning and at bedtime., Disp: , Rfl:  .  Apoaequorin (PREVAGEN) 10 MG CAPS, Take 10 mg by mouth daily., Disp: , Rfl:  .  Artificial Saliva (BIOTENE DRY MOUTH) LOZG, Use as directed 1 lozenge in the mouth or throat daily as needed (dry eyes)., Disp: , Rfl:  .  atorvastatin (LIPITOR) 10 MG tablet, Take 1 tablet (10 mg total) by mouth daily., Disp: 90 tablet, Rfl: 3 .  azelastine (ASTELIN) 0.1 % nasal spray, USE 1-2 SPRAYS IN EACH NOSTRIL TWICE DAILY AS NEEDED (Patient taking differently: Place 2 sprays into both nostrils 2 (two) times daily as needed for allergies. ), Disp: 30  mL, Rfl: 5 .  Boswellia Serrata (BOSWELLIA PO), Take 1 tablet by mouth daily., Disp: , Rfl:  .  Capsaicin-Menthol (SALONPAS GEL EX), Apply 1 application topically daily as needed (pain)., Disp: , Rfl:  .  Coenzyme Q10 (COQ-10) 100 MG CAPS, Take 100 mg by mouth  daily., Disp: , Rfl:  .  cyclobenzaprine (FLEXERIL) 10 MG tablet, Take 1 tablet (10 mg total) by mouth at bedtime as needed for muscle spasms., Disp: 30 tablet, Rfl: 1 .  dextromethorphan-guaiFENesin (MUCINEX DM) 30-600 MG 12hr tablet, Take 1 tablet by mouth 2 (two) times daily as needed for cough., Disp: , Rfl:  .  Empagliflozin-linaGLIPtin (GLYXAMBI) 10-5 MG TABS, Take 1 tablet by mouth daily., Disp: 30 tablet, Rfl: 11 .  EPINEPHrine 0.3 mg/0.3 mL IJ SOAJ injection, USE AS DIRECTED FOR LIFE THREATENING ALLERGIC REACTIONS (Patient taking differently: Inject 0.3 mg into the muscle as needed for anaphylaxis. ), Disp: 2 each, Rfl: 2 .  famotidine (PEPCID) 20 MG tablet, Take 20 mg by mouth daily as needed (gas)., Disp: , Rfl:  .  famotidine (PEPCID) 40 MG tablet, TAKE 1 TABLET(40 MG) BY MOUTH DAILY (Patient taking differently: Take 40 mg by mouth daily. ), Disp: 90 tablet, Rfl: 1 .  fluticasone (FLOVENT HFA) 220 MCG/ACT inhaler, Inhale two puffs twice daily during flare-up as needed.  Rinse, gargle, and spit after use. (Patient taking differently: Inhale 2 puffs into the lungs 2 (two) times daily as needed (shortness of breath). Rinse, gargle, and spit after use.), Disp: 12 g, Rfl: 5 .  furosemide (LASIX) 40 MG tablet, TAKE 1 TABLET(40 MG) BY MOUTH DAILY (Patient taking differently: Take 40 mg by mouth daily as needed for edema. ), Disp: 30 tablet, Rfl: 3 .  gabapentin (NEURONTIN) 600 MG tablet, TAKE 1 TABLET BY MOUTH DAILY AT BREAKFAST, 1 TABLET AT LUNCH, AND 2 TABLETS AT BEDTIME (Patient taking differently: Take 600-1,200 mg by mouth See admin instructions. Take 600 mg in the morning, 600 mg with lunch, and 1200 mg at night), Disp: 360 tablet, Rfl:  1 .  Garlic (GARLIQUE PO), Take 1 tablet by mouth daily., Disp: , Rfl:  .  Homeopathic Products [facility] RELIEF EX), Apply 1 spray topically daily as needed (muscle cramps)., Disp: , Rfl:  .  ipratropium (ATROVENT) 0.06 % nasal spray, Place 1-2 sprays each nostril 1-2 times per day (Patient taking differently: Place 2 sprays into both nostrils 2 (two) times daily as needed for rhinitis. ), Disp: 15 mL, Rfl: 5 .  irbesartan (AVAPRO) 150 MG tablet, TAKE 1 TABLET(150 MG) BY MOUTH DAILY (Patient taking differently: Take 150 mg by mouth daily. ), Disp: 30 tablet, Rfl: 9 .  Lactase (DAIRY-RELIEF PO), Take 1 capsule by mouth daily as needed (eating dairy). , Disp: , Rfl:  .  levalbuterol (XOPENEX) 1.25 MG/3ML nebulizer solution, USE 1 VIAL VIA NEBULIZER EVERY 6 HOURS AS NEEDED FOR WHEEZING OR SHORTNESS OF BREATH (Patient taking differently: Take 1.25 mg by nebulization every 6 (six) hours as needed for wheezing or shortness of breath. ), Disp: 9 mL, Rfl: 1 .  magnesium gluconate (MAGONATE) 500 MG tablet, Take 500 mg by mouth daily., Disp: , Rfl:  .  meclizine (ANTIVERT) 12.5 MG tablet, Take 1 tablet (12.5 mg total) by mouth 3 (three) times daily as needed for dizziness., Disp: 15 tablet, Rfl: 0 .  methimazole (TAPAZOLE) 5 MG tablet, TAKE 1 TABLET BY MOUTH IN THE MORNING AND 1/2 TABLET IN THE EVENING WITH MEALS, Disp: 225 tablet, Rfl: 1 .  Misc Natural Products (GLUCOSAMINE CHONDROITIN TRIPLE) TABS, Take 2 tablets by mouth daily., Disp: , Rfl:  .  montelukast (SINGULAIR) 10 MG tablet, Take 1 tablet (10 mg total) by mouth daily., Disp: 90 tablet, Rfl: 1 .  nystatin (MYCOSTATIN) 100000 UNIT/ML suspension, SWISH AND  SWALLOW 5 ML BY MOUTH TWICE DAILY AFTER ADVAIR (Patient taking differently: Take 5 mLs by mouth 2 (two) times daily. Use after advair), Disp: 60 mL, Rfl: 5 .  omalizumab (XOLAIR) 150 MG injection, Inject 300 mg into the skin every 28 (twenty-eight) days., Disp: , Rfl:  .  ondansetron (ZOFRAN-ODT) 4  MG disintegrating tablet, Take 4 mg by mouth every 8 (eight) hours as needed for nausea or vomiting. , Disp: , Rfl:  .  OneTouch Delica Lancets 02V MISC, Use to check blood sugar up to 2 times a day., Disp: 100 each, Rfl: 5 .  ONETOUCH VERIO test strip, Use to check blood sugar up to 2 times a day, Disp: 100 each, Rfl: 5 .  oxybutynin (DITROPAN-XL) 5 MG 24 hr tablet, Take 5 mg by mouth daily., Disp: , Rfl:  .  pantoprazole (PROTONIX) 40 MG tablet, TAKE 1 TABLET(40 MG) BY MOUTH DAILY (Patient taking differently: Take 40 mg by mouth daily. ), Disp: 90 tablet, Rfl: 1 .  PATADAY 0.2 % SOLN, Place 1 drop into both eyes daily as needed (allergies). , Disp: , Rfl: 4 .  Polyethyl Glycol-Propyl Glycol (SYSTANE OP), Place 1 drop into both eyes 2 (two) times daily., Disp: , Rfl:  .  Potassium 99 MG TABS, Take 99 mg by mouth daily., Disp: , Rfl:  .  Probiotic Product (ALIGN) 4 MG CAPS, Take 4 mg by mouth daily. , Disp: , Rfl:  .  Pumpkin Seed-Soy Germ (AZO BLADDER CONTROL/GO-LESS) CAPS, Take 1 tablet by mouth 2 (two) times daily., Disp: , Rfl:  .  rivaroxaban (XARELTO) 20 MG TABS tablet, Take 1 tablet (20 mg total) by mouth daily with supper., Disp: 90 tablet, Rfl: 1 .  Tiotropium Bromide Monohydrate (SPIRIVA RESPIMAT) 1.25 MCG/ACT AERS, Inhale 2 puffs into the lungs daily., Disp: 4 g, Rfl: 5 .  tolterodine (DETROL LA) 2 MG 24 hr capsule, Take 2 mg by mouth daily., Disp: , Rfl:  .  triamcinolone cream (KENALOG) 0.1 %, Apply 1 application topically 2 (two) times daily. (Patient taking differently: Apply 1 application topically 2 (two) times daily as needed (itching). ), Disp: 30 g, Rfl: 0 .  TRULANCE 3 MG TABS, Take 3 mg by mouth daily. , Disp: , Rfl:  .  TURMERIC PO, Take 1,000 mg by mouth daily., Disp: , Rfl:  .  VOLTAREN 1 % GEL, Apply 2 g topically 4 (four) times daily. (Patient taking differently: Apply 2 g topically 4 (four) times daily as needed (pain). ), Disp: 100 g, Rfl: 2  Current  Facility-Administered Medications:  .  omalizumab Arvid Right) injection 300 mg, 300 mg, Subcutaneous, Q28 days, Kozlow, Donnamarie Poag, MD, 300 mg at 12/15/19 2536  Review of Systems  Constitutional: Negative for activity change, appetite change, chills, diaphoresis, fatigue, fever and unexpected weight change.  HENT: Negative for congestion, rhinorrhea, sinus pressure, sneezing, sore throat and trouble swallowing.   Eyes: Negative for photophobia and visual disturbance.  Respiratory: Negative for cough, chest tightness, shortness of breath, wheezing and stridor.   Cardiovascular: Negative for chest pain, palpitations and leg swelling.  Gastrointestinal: Negative for abdominal distention, abdominal pain, anal bleeding, blood in stool, constipation, diarrhea, nausea and vomiting.  Genitourinary: Negative for difficulty urinating, dysuria, flank pain and hematuria.  Musculoskeletal: Positive for back pain. Negative for arthralgias, gait problem, joint swelling and myalgias.  Skin: Negative for color change, pallor, rash and wound.  Neurological: Negative for dizziness, tremors, weakness and light-headedness.  Hematological: Negative for adenopathy. Does  not bruise/bleed easily.  Psychiatric/Behavioral: Negative for agitation, behavioral problems, confusion, decreased concentration, dysphoric mood and sleep disturbance.       Objective:   Physical Exam Constitutional:      General: She is not in acute distress.    Appearance: She is well-developed. She is not diaphoretic.  HENT:     Head: Normocephalic and atraumatic.     Mouth/Throat:     Pharynx: No oropharyngeal exudate.  Eyes:     General: No scleral icterus.    Conjunctiva/sclera: Conjunctivae normal.  Cardiovascular:     Rate and Rhythm: Normal rate and regular rhythm.  Pulmonary:     Effort: Pulmonary effort is normal. No respiratory distress.     Breath sounds: No wheezing.  Abdominal:     General: There is no distension.   Musculoskeletal:        General: No tenderness.     Cervical back: Normal range of motion and neck supple.  Skin:    General: Skin is warm and dry.     Coloration: Skin is not pale.     Findings: No erythema or rash.  Neurological:     General: No focal deficit present.     Mental Status: She is alert and oriented to person, place, and time.     Motor: No abnormal muscle tone.     Coordination: Coordination normal.  Psychiatric:        Mood and Affect: Mood normal.        Behavior: Behavior normal.        Thought Content: Thought content normal.        Judgment: Judgment normal.    PICC line was clean dry and intact       Assessment & Plan:  Lumbosacral discitis:  Continue vancomycin and ceftriaxone to complete at least 6 weeks of therapy.  I have scheduled her with Dr. De Burrs on 10 August which is 2 days prior to her stop date.  Continue to monitor her pain levels.  Inflammatory markers like unlikely be helpful since they were normal at baseline.  Penicillin allergy: Fairly remote may be worth testing her for penicillin allergy in the future to open up other options of antibiotic should she need them down the road.  DM: a1c was 6.1 well controlled

## 2020-01-19 NOTE — Telephone Encounter (Signed)
It would be fine to use both arms.  There will not be an interaction with the other injections.

## 2020-01-19 NOTE — Telephone Encounter (Signed)
Patient informed,

## 2020-01-19 NOTE — Telephone Encounter (Signed)
-----   Message from Cloyd Stagers, RT sent at 01/06/2020 11:33 AM EDT ----- Regarding: Lab Orders for Tuesday 7.6.2021 Please place lab orders for Tuesday 7.6.2021, office visit for 3 month f/u on Thursday 7.8.2021 Thank you, Dyke Maes RT(R)

## 2020-01-19 NOTE — Telephone Encounter (Signed)
Per Dr. Tommy Medal called advance to confirm antibiotics last through 8/12. Spoke with Lenna Sciara was has end date for 7/10. Will extend two days to complete 6 weeks of treatment. [place]

## 2020-01-20 ENCOUNTER — Ambulatory Visit (INDEPENDENT_AMBULATORY_CARE_PROVIDER_SITE_OTHER): Payer: Medicare Other

## 2020-01-20 ENCOUNTER — Other Ambulatory Visit: Payer: Self-pay

## 2020-01-20 DIAGNOSIS — J4550 Severe persistent asthma, uncomplicated: Secondary | ICD-10-CM | POA: Diagnosis not present

## 2020-01-20 LAB — HM MAMMOGRAPHY

## 2020-01-21 ENCOUNTER — Other Ambulatory Visit: Payer: Self-pay

## 2020-01-21 ENCOUNTER — Encounter: Payer: Self-pay | Admitting: Family Medicine

## 2020-01-21 ENCOUNTER — Ambulatory Visit: Payer: Medicare Other | Admitting: Family Medicine

## 2020-01-21 ENCOUNTER — Ambulatory Visit (INDEPENDENT_AMBULATORY_CARE_PROVIDER_SITE_OTHER): Payer: Medicare Other | Admitting: Family Medicine

## 2020-01-21 VITALS — BP 122/68 | HR 80 | Temp 97.6°F | Ht 64.5 in | Wt 219.0 lb

## 2020-01-21 DIAGNOSIS — G8929 Other chronic pain: Secondary | ICD-10-CM | POA: Diagnosis not present

## 2020-01-21 DIAGNOSIS — E119 Type 2 diabetes mellitus without complications: Secondary | ICD-10-CM | POA: Diagnosis not present

## 2020-01-21 DIAGNOSIS — E1140 Type 2 diabetes mellitus with diabetic neuropathy, unspecified: Secondary | ICD-10-CM | POA: Diagnosis not present

## 2020-01-21 DIAGNOSIS — G894 Chronic pain syndrome: Secondary | ICD-10-CM

## 2020-01-21 DIAGNOSIS — H9313 Tinnitus, bilateral: Secondary | ICD-10-CM | POA: Diagnosis not present

## 2020-01-21 DIAGNOSIS — M4647 Discitis, unspecified, lumbosacral region: Secondary | ICD-10-CM | POA: Diagnosis not present

## 2020-01-21 DIAGNOSIS — E7800 Pure hypercholesterolemia, unspecified: Secondary | ICD-10-CM

## 2020-01-21 DIAGNOSIS — M545 Low back pain, unspecified: Secondary | ICD-10-CM

## 2020-01-21 DIAGNOSIS — I272 Pulmonary hypertension, unspecified: Secondary | ICD-10-CM | POA: Diagnosis not present

## 2020-01-21 DIAGNOSIS — I10 Essential (primary) hypertension: Secondary | ICD-10-CM

## 2020-01-21 DIAGNOSIS — Z452 Encounter for adjustment and management of vascular access device: Secondary | ICD-10-CM | POA: Diagnosis not present

## 2020-01-21 DIAGNOSIS — E1340 Other specified diabetes mellitus with diabetic neuropathy, unspecified: Secondary | ICD-10-CM

## 2020-01-21 LAB — HM DIABETES FOOT EXAM

## 2020-01-21 NOTE — Assessment & Plan Note (Signed)
Now only using tylenol for pain.

## 2020-01-21 NOTE — Assessment & Plan Note (Signed)
Stable foot exam today.  

## 2020-01-21 NOTE — Assessment & Plan Note (Signed)
Currently treated by ID with 6 weeks of OV antibiotics via PICC line.

## 2020-01-21 NOTE — Progress Notes (Signed)
Chief Complaint  Patient presents with  . Diabetes  . Pain Management    History of Present Illness: HPI   74 year old female presents for follow up DM and pain management.  Indication for chronic opioid:  Chronic low back pain.. treated now by Dr. Drucilla Schmidt ID for lumbosacral discitis L4-S1.Marland Kitchen on 6 weeks of  IV antibiotic therapy through PICC line. On 1 week of 6 expected course.   On gabapentin, flexeril and Cymbalta. Medication and dose: tylenol # 3 1-2 tabs po once daily prn pain in past.  She has stopped this given Med SE. She is using Tylenol alone. This seems to managing things moderately well.  # pills per month: 60 in past. Last UDS date: 07/27/19 Opioid Treatment Agreement signed (Y/N): Y Opioid Treatment Agreement last reviewed with patient:  Y [facility] reviewed this encounter (include red flags):   no red flags 01/21/20   Diabetes:   Scanned in labs form 01/15/2020 reviewed. A1C: 6.1 Lab Results  Component Value Date   HGBA1C 6.4 04/09/2019  Using medications without difficulties: Hypoglycemic episodes: Hyperglycemic episodes: Feet problems: Followed by Dr. Milinda Pointer Podiat[facility] Blood Sugars averaging: eye exam within last year:  Hypertension:   Good control on current regimen  Using medication without problems or lightheadedness:  Chest pain with exertion: Edema: Short of breath: Average home BPs: Other issues:  High chol LDL 45 on statin.   This visit occurred during the SARS-CoV-2 public health emergency.  Safety protocols were in place, including screening questions prior to the visit, additional usage of staff PPE, and extensive cleaning of exam room while observing appropriate contact time as indicated for disinfecting solutions.   COVID 19 screen:  No recent travel or known exposure to COVID19 The patient denies respiratory symptoms of COVID 19 at this time. The importance of social distancing was discussed today.     ROS    Past Medical History:  Diagnosis  Date  . Allergic rhinitis   . Allergy   . Arthritis   . Asthma   . Chronic headache   . Diabetes mellitus   . DVT (deep venous thrombosis) ([place])   . Dyspnea   . Heart murmur   . Hypertension   . Penicillin allergy 01/19/2020  . Pulmonary embolism ([place])   . Pulmonary hypertension ([place])   . Seizures ([place])     reports that she has never smoked. She has never used smokeless tobacco. She reports previous alcohol use. She reports that she does not use drugs.   Current Outpatient Medications:  .  acetaminophen (TYLENOL) 650 MG CR tablet, Take 1,300 mg by mouth daily as needed for pain., Disp: , Rfl:  .  ADVAIR HFA 017-51 MCG/ACT inhaler, INHALE 2 PUFFS BY MOUTH TWICE DAILY TO PREVENT COUGH OR WHEEZING, RINSE MOUTH AFTER USE, Disp: 12 g, Rfl: 4 .  antiseptic oral rinse (BIOTENE) LIQD, 15 mLs by Mouth Rinse route in the morning and at bedtime., Disp: , Rfl:  .  Apoaequorin (PREVAGEN) 10 MG CAPS, Take 10 mg by mouth daily., Disp: , Rfl:  .  Artificial Saliva (BIOTENE DRY MOUTH) LOZG, Use as directed 1 lozenge in the mouth or throat daily as needed (dry eyes)., Disp: , Rfl:  .  atorvastatin (LIPITOR) 10 MG tablet, Take 1 tablet (10 mg total) by mouth daily., Disp: 90 tablet, Rfl: 3 .  azelastine (ASTELIN) 0.1 % nasal spray, USE 1-2 SPRAYS IN EACH NOSTRIL TWICE DAILY AS NEEDED (Patient taking differently: Place 2 sprays into  both nostrils 2 (two) times daily as needed for allergies. ), Disp: 30 mL, Rfl: 5 .  Boswellia Serrata (BOSWELLIA PO), Take 1 tablet by mouth daily. , Disp: , Rfl:  .  Capsaicin-Menthol (SALONPAS GEL EX), Apply 1 application topically daily as needed (pain)., Disp: , Rfl:  .  Coenzyme Q10 (COQ-10) 100 MG CAPS, Take 100 mg by mouth daily., Disp: , Rfl:  .  cyclobenzaprine (FLEXERIL) 10 MG tablet, Take 1 tablet (10 mg total) by mouth at bedtime as needed for muscle spasms., Disp: 30 tablet, Rfl: 1 .  dextromethorphan-guaiFENesin (MUCINEX DM) 30-600 MG 12hr tablet, Take 1 tablet by  mouth 2 (two) times daily as needed for cough., Disp: , Rfl:  .  Empagliflozin-linaGLIPtin (GLYXAMBI) 10-5 MG TABS, Take 1 tablet by mouth daily., Disp: 30 tablet, Rfl: 11 .  EPINEPHrine 0.3 mg/0.3 mL IJ SOAJ injection, USE AS DIRECTED FOR LIFE THREATENING ALLERGIC REACTIONS (Patient taking differently: Inject 0.3 mg into the muscle as needed for anaphylaxis. ), Disp: 2 each, Rfl: 2 .  famotidine (PEPCID) 40 MG tablet, TAKE 1 TABLET(40 MG) BY MOUTH DAILY (Patient taking differently: Take 40 mg by mouth daily. ), Disp: 90 tablet, Rfl: 1 .  fluticasone (FLOVENT HFA) 220 MCG/ACT inhaler, Inhale two puffs twice daily during flare-up as needed.  Rinse, gargle, and spit after use. (Patient taking differently: Inhale 2 puffs into the lungs 2 (two) times daily as needed (shortness of breath). Rinse, gargle, and spit after use.), Disp: 12 g, Rfl: 5 .  furosemide (LASIX) 40 MG tablet, TAKE 1 TABLET(40 MG) BY MOUTH DAILY (Patient taking differently: Take 40 mg by mouth daily as needed for edema. ), Disp: 30 tablet, Rfl: 3 .  gabapentin (NEURONTIN) 600 MG tablet, TAKE 1 TABLET BY MOUTH DAILY AT BREAKFAST, 1 TABLET AT LUNCH, AND 2 TABLETS AT BEDTIME (Patient taking differently: Take 600-1,200 mg by mouth See admin instructions. Take 600 mg in the morning, 600 mg with lunch, and 1200 mg at night), Disp: 360 tablet, Rfl: 1 .  Garlic (GARLIQUE PO), Take 1 tablet by mouth daily., Disp: , Rfl:  .  Homeopathic Products [facility] RELIEF EX), Apply 1 spray topically daily as needed (muscle cramps)., Disp: , Rfl:  .  ipratropium (ATROVENT) 0.06 % nasal spray, Place 1-2 sprays each nostril 1-2 times per day (Patient taking differently: Place 2 sprays into both nostrils 2 (two) times daily as needed for rhinitis. ), Disp: 15 mL, Rfl: 5 .  irbesartan (AVAPRO) 150 MG tablet, TAKE 1 TABLET(150 MG) BY MOUTH DAILY (Patient taking differently: Take 150 mg by mouth daily. ), Disp: 30 tablet, Rfl: 9 .  Lactase (DAIRY-RELIEF PO), Take 1  capsule by mouth daily as needed (eating dairy). , Disp: , Rfl:  .  levalbuterol (XOPENEX) 1.25 MG/3ML nebulizer solution, USE 1 VIAL VIA NEBULIZER EVERY 6 HOURS AS NEEDED FOR WHEEZING OR SHORTNESS OF BREATH (Patient taking differently: Take 1.25 mg by nebulization every 6 (six) hours as needed for wheezing or shortness of breath. ), Disp: 9 mL, Rfl: 1 .  magnesium gluconate (MAGONATE) 500 MG tablet, Take 500 mg by mouth daily., Disp: , Rfl:  .  meclizine (ANTIVERT) 12.5 MG tablet, Take 1 tablet (12.5 mg total) by mouth 3 (three) times daily as needed for dizziness., Disp: 15 tablet, Rfl: 0 .  methimazole (TAPAZOLE) 5 MG tablet, TAKE 1 TABLET BY MOUTH IN THE MORNING AND 1/2 TABLET IN THE EVENING WITH MEALS, Disp: 225 tablet, Rfl: 1 .  Misc Natural  Products (GLUCOSAMINE CHONDROITIN TRIPLE) TABS, Take 2 tablets by mouth daily., Disp: , Rfl:  .  montelukast (SINGULAIR) 10 MG tablet, Take 1 tablet (10 mg total) by mouth daily., Disp: 90 tablet, Rfl: 1 .  nystatin (MYCOSTATIN) 100000 UNIT/ML suspension, SWISH AND SWALLOW 5 ML BY MOUTH TWICE DAILY AFTER ADVAIR (Patient taking differently: Take 5 mLs by mouth 2 (two) times daily. Use after advair), Disp: 60 mL, Rfl: 5 .  omalizumab (XOLAIR) 150 MG injection, Inject 300 mg into the skin every 28 (twenty-eight) days., Disp: , Rfl:  .  ondansetron (ZOFRAN-ODT) 4 MG disintegrating tablet, Take 4 mg by mouth every 8 (eight) hours as needed for nausea or vomiting. , Disp: , Rfl:  .  OneTouch Delica Lancets 27P MISC, Use to check blood sugar up to 2 times a day., Disp: 100 each, Rfl: 5 .  ONETOUCH VERIO test strip, Use to check blood sugar up to 2 times a day, Disp: 100 each, Rfl: 5 .  oxybutynin (DITROPAN-XL) 5 MG 24 hr tablet, Take 5 mg by mouth daily., Disp: , Rfl:  .  pantoprazole (PROTONIX) 40 MG tablet, TAKE 1 TABLET(40 MG) BY MOUTH DAILY (Patient taking differently: Take 40 mg by mouth daily. ), Disp: 90 tablet, Rfl: 1 .  PATADAY 0.2 % SOLN, Place 1 drop into  both eyes daily as needed (allergies). , Disp: , Rfl: 4 .  Polyethyl Glycol-Propyl Glycol (SYSTANE OP), Place 1 drop into both eyes 2 (two) times daily., Disp: , Rfl:  .  Potassium 99 MG TABS, Take 99 mg by mouth daily., Disp: , Rfl:  .  Probiotic Product (ALIGN) 4 MG CAPS, Take 4 mg by mouth daily. , Disp: , Rfl:  .  Pumpkin Seed-Soy Germ (AZO BLADDER CONTROL/GO-LESS) CAPS, Take 1 tablet by mouth 2 (two) times daily., Disp: , Rfl:  .  rivaroxaban (XARELTO) 20 MG TABS tablet, Take 1 tablet (20 mg total) by mouth daily with supper., Disp: 90 tablet, Rfl: 1 .  Tiotropium Bromide Monohydrate (SPIRIVA RESPIMAT) 1.25 MCG/ACT AERS, Inhale 2 puffs into the lungs daily., Disp: 4 g, Rfl: 5 .  tolterodine (DETROL LA) 2 MG 24 hr capsule, Take 2 mg by mouth daily., Disp: , Rfl:  .  triamcinolone cream (KENALOG) 0.1 %, Apply 1 application topically 2 (two) times daily. (Patient taking differently: Apply 1 application topically 2 (two) times daily as needed (itching). ), Disp: 30 g, Rfl: 0 .  TRULANCE 3 MG TABS, Take 3 mg by mouth daily. , Disp: , Rfl:  .  TURMERIC PO, Take 1,000 mg by mouth daily., Disp: , Rfl:  .  VOLTAREN 1 % GEL, Apply 2 g topically 4 (four) times daily. (Patient taking differently: Apply 2 g topically 4 (four) times daily as needed (pain). ), Disp: 100 g, Rfl: 2  Current Facility-Administered Medications:  .  omalizumab Arvid Right) injection 300 mg, 300 mg, Subcutaneous, Q28 days, Kozlow, Donnamarie Poag, MD, 300 mg at 01/20/20 1326   Observations/Objective: Blood pressure 122/68, pulse 80, temperature 97.6 F (36.4 C), temperature source Temporal, height 5' 4.5" (1.638 m), weight 219 lb (99.3 kg), SpO2 98 %.  Physical Exam Constitutional:      General: She is not in acute distress.    Appearance: Normal appearance. She is well-developed. She is obese. She is not ill-appearing or toxic-appearing.  HENT:     Head: Normocephalic.     Right Ear: Hearing, tympanic membrane, ear canal and external  ear normal. Tympanic membrane is not erythematous,  retracted or bulging.     Left Ear: Hearing, tympanic membrane, ear canal and external ear normal. Tympanic membrane is not erythematous, retracted or bulging.     Nose: No mucosal edema or rhinorrhea.     Right Sinus: No maxillary sinus tenderness or frontal sinus tenderness.     Left Sinus: No maxillary sinus tenderness or frontal sinus tenderness.     Mouth/Throat:     Pharynx: Uvula midline.  Eyes:     General: Lids are normal. Lids are everted, no foreign bodies appreciated.     Conjunctiva/sclera: Conjunctivae normal.     Pupils: Pupils are equal, round, and reactive to light.  Neck:     Thyroid: No thyroid mass or thyromegaly.     Vascular: No carotid bruit.     Trachea: Trachea normal.  Cardiovascular:     Rate and Rhythm: Normal rate and regular rhythm.     Pulses: Normal pulses.     Heart sounds: Normal heart sounds, S1 normal and S2 normal. No murmur heard.  No friction rub. No gallop.   Pulmonary:     Effort: Pulmonary effort is normal. No tachypnea or respiratory distress.     Breath sounds: Normal breath sounds. No decreased breath sounds, wheezing, rhonchi or rales.  Abdominal:     General: Bowel sounds are normal.     Palpations: Abdomen is soft.     Tenderness: There is no abdominal tenderness.  Musculoskeletal:     Cervical back: Normal range of motion and neck supple.  Skin:    General: Skin is warm and dry.     Findings: No rash.  Neurological:     Mental Status: She is alert.  Psychiatric:        Mood and Affect: Mood is not anxious or depressed.        Speech: Speech normal.        Behavior: Behavior normal. Behavior is cooperative.        Thought Content: Thought content normal.        Judgment: Judgment normal.      Diabetic foot exam: Normal inspection No skin breakdown No calluses  Normal DP pulses Decreased sensation to light touch and monofilament Nails  thicknend  on left 1 and 2nd. 2nd  disolored  Assessment and Plan Diabetes mellitus with neuropathy ([place]) Well controlled. Continue current medication.   Essential hypertension, benign Well controlled. Continue current medication.   Neuropathy due to secondary diabetes  Stable foot exam today.  High cholesterol At goal on statin.  Encounter for chronic pain management Now only using tylenol for pain.  Discitis of lumbosacral region Currently treated by ID with 6 weeks of OV antibiotics via PICC line.       [name], MD

## 2020-01-21 NOTE — Assessment & Plan Note (Signed)
Well controlled. Continue current medication.  

## 2020-01-21 NOTE — Assessment & Plan Note (Signed)
At goal on statin 

## 2020-01-21 NOTE — Patient Instructions (Addendum)
Try a trial off of magnesium for  Improvement in GI symptoms.  Continue tylenol for pain.

## 2020-01-22 ENCOUNTER — Telehealth: Payer: Self-pay | Admitting: Family Medicine

## 2020-01-22 NOTE — Telephone Encounter (Signed)
Patient called She stated she is scheduled to have her 2nd vaccine done on Monday., She stated that you are aware about the tick in her arm and wanted to know if you think she should still proceed with the vaccine. Patient stated that she would have called Dr Derek [place] office but they are closed

## 2020-01-22 NOTE — Telephone Encounter (Signed)
PICC line not tick. Okay to proceed with COVID vaccine#2.

## 2020-01-22 NOTE — Telephone Encounter (Signed)
Pt aware of recommendations   Nothing further needed.

## 2020-01-25 ENCOUNTER — Encounter: Payer: Self-pay | Admitting: Internal Medicine

## 2020-01-25 ENCOUNTER — Encounter: Payer: Self-pay | Admitting: Family Medicine

## 2020-01-25 DIAGNOSIS — Z452 Encounter for adjustment and management of vascular access device: Secondary | ICD-10-CM | POA: Diagnosis not present

## 2020-01-25 DIAGNOSIS — M4647 Discitis, unspecified, lumbosacral region: Secondary | ICD-10-CM | POA: Diagnosis not present

## 2020-01-25 DIAGNOSIS — E119 Type 2 diabetes mellitus without complications: Secondary | ICD-10-CM | POA: Diagnosis not present

## 2020-01-25 DIAGNOSIS — I272 Pulmonary hypertension, unspecified: Secondary | ICD-10-CM | POA: Diagnosis not present

## 2020-01-25 DIAGNOSIS — H9313 Tinnitus, bilateral: Secondary | ICD-10-CM | POA: Diagnosis not present

## 2020-01-25 DIAGNOSIS — I10 Essential (primary) hypertension: Secondary | ICD-10-CM | POA: Diagnosis not present

## 2020-01-28 DIAGNOSIS — Z5181 Encounter for therapeutic drug level monitoring: Secondary | ICD-10-CM | POA: Diagnosis not present

## 2020-01-28 DIAGNOSIS — Z452 Encounter for adjustment and management of vascular access device: Secondary | ICD-10-CM | POA: Diagnosis not present

## 2020-01-28 DIAGNOSIS — I272 Pulmonary hypertension, unspecified: Secondary | ICD-10-CM | POA: Diagnosis not present

## 2020-01-28 DIAGNOSIS — M4627 Osteomyelitis of vertebra, lumbosacral region: Secondary | ICD-10-CM | POA: Diagnosis not present

## 2020-01-28 DIAGNOSIS — M4647 Discitis, unspecified, lumbosacral region: Secondary | ICD-10-CM | POA: Diagnosis not present

## 2020-01-28 DIAGNOSIS — I10 Essential (primary) hypertension: Secondary | ICD-10-CM | POA: Diagnosis not present

## 2020-01-28 DIAGNOSIS — E119 Type 2 diabetes mellitus without complications: Secondary | ICD-10-CM | POA: Diagnosis not present

## 2020-01-28 DIAGNOSIS — H9313 Tinnitus, bilateral: Secondary | ICD-10-CM | POA: Diagnosis not present

## 2020-02-01 ENCOUNTER — Other Ambulatory Visit: Payer: Self-pay | Admitting: Family Medicine

## 2020-02-01 DIAGNOSIS — Z452 Encounter for adjustment and management of vascular access device: Secondary | ICD-10-CM | POA: Diagnosis not present

## 2020-02-01 DIAGNOSIS — Z5181 Encounter for therapeutic drug level monitoring: Secondary | ICD-10-CM | POA: Diagnosis not present

## 2020-02-01 DIAGNOSIS — M4647 Discitis, unspecified, lumbosacral region: Secondary | ICD-10-CM | POA: Diagnosis not present

## 2020-02-01 DIAGNOSIS — I10 Essential (primary) hypertension: Secondary | ICD-10-CM | POA: Diagnosis not present

## 2020-02-01 DIAGNOSIS — E119 Type 2 diabetes mellitus without complications: Secondary | ICD-10-CM | POA: Diagnosis not present

## 2020-02-01 DIAGNOSIS — I272 Pulmonary hypertension, unspecified: Secondary | ICD-10-CM | POA: Diagnosis not present

## 2020-02-01 DIAGNOSIS — M4627 Osteomyelitis of vertebra, lumbosacral region: Secondary | ICD-10-CM | POA: Diagnosis not present

## 2020-02-01 DIAGNOSIS — H9313 Tinnitus, bilateral: Secondary | ICD-10-CM | POA: Diagnosis not present

## 2020-02-01 MED ORDER — MECLIZINE HCL 12.5 MG PO TABS: 12.5000 mg | ORAL_TABLET | Freq: Three times a day (TID) | ORAL | 0 refills | Status: DC | PRN

## 2020-02-01 NOTE — Telephone Encounter (Signed)
Last office visit 01/21/2020 for DM.  Last refilled Meclizine 04/05/2019 for #15 with no refills.  Hydroxyzine not on current medication list.  Next Appt: 02/02/2020 for med refill.

## 2020-02-01 NOTE — Telephone Encounter (Signed)
Patient called and needs expired medication refilled for Hydroxyzine 10 MG and Meclizine 12.5 MG.

## 2020-02-01 NOTE — Telephone Encounter (Signed)
What about the hydroxyzine??  Refill?  Patient does have appointment with you tomorrow for med refill.

## 2020-02-01 NOTE — Telephone Encounter (Signed)
I will discuss the hydroxyzine with her then.

## 2020-02-02 ENCOUNTER — Encounter: Payer: Self-pay | Admitting: Family Medicine

## 2020-02-02 ENCOUNTER — Other Ambulatory Visit: Payer: Self-pay

## 2020-02-02 ENCOUNTER — Ambulatory Visit: Payer: Medicare Other | Admitting: Family Medicine

## 2020-02-02 ENCOUNTER — Ambulatory Visit (INDEPENDENT_AMBULATORY_CARE_PROVIDER_SITE_OTHER): Payer: Medicare Other | Admitting: Family Medicine

## 2020-02-02 VITALS — BP 140/72 | HR 93 | Temp 98.1°F | Ht 64.5 in | Wt 218.5 lb

## 2020-02-02 DIAGNOSIS — Z79899 Other long term (current) drug therapy: Secondary | ICD-10-CM | POA: Diagnosis not present

## 2020-02-02 MED ORDER — HYDROXYZINE HCL 10 MG PO TABS
10.0000 mg | ORAL_TABLET | Freq: Every day | ORAL | 0 refills | Status: DC | PRN
Start: 2020-02-02 — End: 2020-12-16

## 2020-02-02 NOTE — Patient Instructions (Signed)
Use meclizine and atarax with caution and rarely.. do not use at the same time. Talk with foot doctor about weaning off Neurontin as could be causing fatigue and is not helping.

## 2020-02-02 NOTE — Assessment & Plan Note (Signed)
Counseled patient on limiting use of high risk meds.  She rarely uses hydroxyzine or meclizine for symptoms but likes to have small amount on had.  Anticholinergics are  on [facility]'s list for elderly.. but she would like to continue oxybutynin as it has helped a lot.  She feels Neurontin has not helped but will discuss weaning off with her podiatrist.

## 2020-02-02 NOTE — Progress Notes (Signed)
Chief Complaint  Patient presents with  . Medication Refill    Hydroxyzine for itching    History of Present Illness: HPI    74 year old female presents for med refill.   Concern is that she is on multiple sedating and other anticholinergic med. She is on oxybutynin for OAB, meclizine prn, neurontin.  No constipation. She does feel tired.  She feels like OAB is improved on this regimen, but does not feel Neurontin helps foot pain.  She has itching rarely. Only requesting refill as it has expired..only needing one tablet at a time.  Rarely using meclizine for dizziness... one tablet at a time    This visit occurred during the SARS-CoV-2 public health emergency.  Safety protocols were in place, including screening questions prior to the visit, additional usage of staff PPE, and extensive cleaning of exam room while observing appropriate contact time as indicated for disinfecting solutions.   COVID 19 screen:  No recent travel or known exposure to COVID19 The patient denies respiratory symptoms of COVID 19 at this time. The importance of social distancing was discussed today.     Review of Systems  Constitutional: Positive for malaise/fatigue.      Past Medical History:  Diagnosis Date  . Allergic rhinitis   . Allergy   . Arthritis   . Asthma   . Chronic headache   . Diabetes mellitus   . DVT (deep venous thrombosis) ([place])   . Dyspnea   . Heart murmur   . Hypertension   . Penicillin allergy 01/19/2020  . Pulmonary embolism ([place])   . Pulmonary hypertension ([place])   . Seizures ([place])     reports that she has never smoked. She has never used smokeless tobacco. She reports previous alcohol use. She reports that she does not use drugs.   Current Outpatient Medications:  .  acetaminophen (TYLENOL) 650 MG CR tablet, Take 1,300 mg by mouth daily as needed for pain., Disp: , Rfl:  .  ADVAIR HFA 568-12 MCG/ACT inhaler, INHALE 2 PUFFS BY MOUTH TWICE DAILY TO PREVENT COUGH OR  WHEEZING, RINSE MOUTH AFTER USE, Disp: 12 g, Rfl: 4 .  antiseptic oral rinse (BIOTENE) LIQD, 15 mLs by Mouth Rinse route in the morning and at bedtime., Disp: , Rfl:  .  Apoaequorin (PREVAGEN) 10 MG CAPS, Take 10 mg by mouth daily., Disp: , Rfl:  .  Artificial Saliva (BIOTENE DRY MOUTH) LOZG, Use as directed 1 lozenge in the mouth or throat daily as needed (dry eyes)., Disp: , Rfl:  .  atorvastatin (LIPITOR) 10 MG tablet, Take 1 tablet (10 mg total) by mouth daily., Disp: 90 tablet, Rfl: 3 .  azelastine (ASTELIN) 0.1 % nasal spray, USE 1-2 SPRAYS IN EACH NOSTRIL TWICE DAILY AS NEEDED (Patient taking differently: Place 2 sprays into both nostrils 2 (two) times daily as needed for allergies. ), Disp: 30 mL, Rfl: 5 .  Capsaicin-Menthol (SALONPAS GEL EX), Apply 1 application topically daily as needed (pain)., Disp: , Rfl:  .  Coenzyme Q10 (COQ-10) 100 MG CAPS, Take 100 mg by mouth daily., Disp: , Rfl:  .  cyclobenzaprine (FLEXERIL) 10 MG tablet, Take 1 tablet (10 mg total) by mouth at bedtime as needed for muscle spasms., Disp: 30 tablet, Rfl: 1 .  dextromethorphan-guaiFENesin (MUCINEX DM) 30-600 MG 12hr tablet, Take 1 tablet by mouth 2 (two) times daily as needed for cough., Disp: , Rfl:  .  Empagliflozin-linaGLIPtin (GLYXAMBI) 10-5 MG TABS, Take 1 tablet by mouth daily.,  Disp: 30 tablet, Rfl: 11 .  EPINEPHrine 0.3 mg/0.3 mL IJ SOAJ injection, USE AS DIRECTED FOR LIFE THREATENING ALLERGIC REACTIONS (Patient taking differently: Inject 0.3 mg into the muscle as needed for anaphylaxis. ), Disp: 2 each, Rfl: 2 .  famotidine (PEPCID) 40 MG tablet, TAKE 1 TABLET(40 MG) BY MOUTH DAILY (Patient taking differently: Take 40 mg by mouth daily. ), Disp: 90 tablet, Rfl: 1 .  fluticasone (FLOVENT HFA) 220 MCG/ACT inhaler, Inhale two puffs twice daily during flare-up as needed.  Rinse, gargle, and spit after use. (Patient taking differently: Inhale 2 puffs into the lungs 2 (two) times daily as needed (shortness of  breath). Rinse, gargle, and spit after use.), Disp: 12 g, Rfl: 5 .  furosemide (LASIX) 40 MG tablet, TAKE 1 TABLET(40 MG) BY MOUTH DAILY (Patient taking differently: Take 40 mg by mouth daily as needed for edema. ), Disp: 30 tablet, Rfl: 3 .  gabapentin (NEURONTIN) 600 MG tablet, TAKE 1 TABLET BY MOUTH DAILY AT BREAKFAST, 1 TABLET AT LUNCH, AND 2 TABLETS AT BEDTIME (Patient taking differently: Take 600-1,200 mg by mouth See admin instructions. Take 600 mg in the morning, 600 mg with lunch, and 1200 mg at night), Disp: 360 tablet, Rfl: 1 .  Garlic (GARLIQUE PO), Take 1 tablet by mouth daily., Disp: , Rfl:  .  Homeopathic Products [facility] RELIEF EX), Apply 1 spray topically daily as needed (muscle cramps)., Disp: , Rfl:  .  ipratropium (ATROVENT) 0.06 % nasal spray, Place 1-2 sprays each nostril 1-2 times per day (Patient taking differently: Place 2 sprays into both nostrils 2 (two) times daily as needed for rhinitis. ), Disp: 15 mL, Rfl: 5 .  irbesartan (AVAPRO) 150 MG tablet, TAKE 1 TABLET(150 MG) BY MOUTH DAILY (Patient taking differently: Take 150 mg by mouth daily. ), Disp: 30 tablet, Rfl: 9 .  Lactase (DAIRY-RELIEF PO), Take 1 capsule by mouth daily as needed (eating dairy). , Disp: , Rfl:  .  levalbuterol (XOPENEX) 1.25 MG/3ML nebulizer solution, USE 1 VIAL VIA NEBULIZER EVERY 6 HOURS AS NEEDED FOR WHEEZING OR SHORTNESS OF BREATH (Patient taking differently: Take 1.25 mg by nebulization every 6 (six) hours as needed for wheezing or shortness of breath. ), Disp: 9 mL, Rfl: 1 .  meclizine (ANTIVERT) 12.5 MG tablet, Take 1 tablet (12.5 mg total) by mouth 3 (three) times daily as needed for dizziness., Disp: 15 tablet, Rfl: 0 .  methimazole (TAPAZOLE) 5 MG tablet, TAKE 1 TABLET BY MOUTH IN THE MORNING AND 1/2 TABLET IN THE EVENING WITH MEALS, Disp: 225 tablet, Rfl: 1 .  Misc Natural Products (GLUCOSAMINE CHONDROITIN TRIPLE) TABS, Take 2 tablets by mouth daily., Disp: , Rfl:  .  montelukast  (SINGULAIR) 10 MG tablet, Take 1 tablet (10 mg total) by mouth daily., Disp: 90 tablet, Rfl: 1 .  nystatin (MYCOSTATIN) 100000 UNIT/ML suspension, SWISH AND SWALLOW 5 ML BY MOUTH TWICE DAILY AFTER ADVAIR (Patient taking differently: Take 5 mLs by mouth 2 (two) times daily. Use after advair), Disp: 60 mL, Rfl: 5 .  omalizumab (XOLAIR) 150 MG injection, Inject 300 mg into the skin every 28 (twenty-eight) days., Disp: , Rfl:  .  ondansetron (ZOFRAN-ODT) 4 MG disintegrating tablet, Take 4 mg by mouth every 8 (eight) hours as needed for nausea or vomiting. , Disp: , Rfl:  .  OneTouch Delica Lancets 01U MISC, Use to check blood sugar up to 2 times a day., Disp: 100 each, Rfl: 5 .  ONETOUCH VERIO test strip, Use  to check blood sugar up to 2 times a day, Disp: 100 each, Rfl: 5 .  oxybutynin (DITROPAN-XL) 5 MG 24 hr tablet, Take 5 mg by mouth daily., Disp: , Rfl:  .  pantoprazole (PROTONIX) 40 MG tablet, TAKE 1 TABLET(40 MG) BY MOUTH DAILY (Patient taking differently: Take 40 mg by mouth daily. ), Disp: 90 tablet, Rfl: 1 .  PATADAY 0.2 % SOLN, Place 1 drop into both eyes daily as needed (allergies). , Disp: , Rfl: 4 .  Polyethyl Glycol-Propyl Glycol (SYSTANE OP), Place 1 drop into both eyes 2 (two) times daily., Disp: , Rfl:  .  Potassium 99 MG TABS, Take 99 mg by mouth daily., Disp: , Rfl:  .  Probiotic Product (ALIGN) 4 MG CAPS, Take 4 mg by mouth daily. , Disp: , Rfl:  .  Pumpkin Seed-Soy Germ (AZO BLADDER CONTROL/GO-LESS) CAPS, Take 1 tablet by mouth 2 (two) times daily., Disp: , Rfl:  .  rivaroxaban (XARELTO) 20 MG TABS tablet, Take 1 tablet (20 mg total) by mouth daily with supper., Disp: 90 tablet, Rfl: 1 .  Tiotropium Bromide Monohydrate (SPIRIVA RESPIMAT) 1.25 MCG/ACT AERS, Inhale 2 puffs into the lungs daily., Disp: 4 g, Rfl: 5 .  triamcinolone cream (KENALOG) 0.1 %, Apply 1 application topically 2 (two) times daily. (Patient taking differently: Apply 1 application topically 2 (two) times daily as  needed (itching). ), Disp: 30 g, Rfl: 0 .  TRULANCE 3 MG TABS, Take 3 mg by mouth daily. , Disp: , Rfl:  .  TURMERIC PO, Take 1,000 mg by mouth daily., Disp: , Rfl:  .  VOLTAREN 1 % GEL, Apply 2 g topically 4 (four) times daily. (Patient taking differently: Apply 2 g topically 4 (four) times daily as needed (pain). ), Disp: 100 g, Rfl: 2  Current Facility-Administered Medications:  .  omalizumab Arvid Right) injection 300 mg, 300 mg, Subcutaneous, Q28 days, Kozlow, Donnamarie Poag, MD, 300 mg at 01/20/20 1326   Observations/Objective: Blood pressure 140/72, pulse 93, temperature 98.1 F (36.7 C), temperature source Temporal, height 5' 4.5" (1.638 m), weight 218 lb 8 oz (99.1 kg), SpO2 96 %.  Physical Exam Constitutional:      General: She is not in acute distress.    Appearance: Normal appearance. She is well-developed. She is obese. She is not ill-appearing or toxic-appearing.  HENT:     Head: Normocephalic.     Right Ear: Hearing, tympanic membrane, ear canal and external ear normal. Tympanic membrane is not erythematous, retracted or bulging.     Left Ear: Hearing, tympanic membrane, ear canal and external ear normal. Tympanic membrane is not erythematous, retracted or bulging.     Nose: No mucosal edema or rhinorrhea.     Right Sinus: No maxillary sinus tenderness or frontal sinus tenderness.     Left Sinus: No maxillary sinus tenderness or frontal sinus tenderness.     Mouth/Throat:     Pharynx: Uvula midline.  Eyes:     General: Lids are normal. Lids are everted, no foreign bodies appreciated.     Conjunctiva/sclera: Conjunctivae normal.     Pupils: Pupils are equal, round, and reactive to light.  Neck:     Thyroid: No thyroid mass or thyromegaly.     Vascular: No carotid bruit.     Trachea: Trachea normal.  Cardiovascular:     Rate and Rhythm: Normal rate and regular rhythm.     Pulses: Normal pulses.     Heart sounds: Normal heart sounds, S1 normal  and S2 normal. No murmur heard.  No  friction rub. No gallop.   Pulmonary:     Effort: Pulmonary effort is normal. No tachypnea or respiratory distress.     Breath sounds: Normal breath sounds. No decreased breath sounds, wheezing, rhonchi or rales.  Abdominal:     General: Bowel sounds are normal.     Palpations: Abdomen is soft.     Tenderness: There is no abdominal tenderness.  Musculoskeletal:     Cervical back: Normal range of motion and neck supple.  Skin:    General: Skin is warm and dry.     Findings: No rash.  Neurological:     Mental Status: She is alert.  Psychiatric:        Mood and Affect: Mood is not anxious or depressed.        Speech: Speech normal.        Behavior: Behavior normal. Behavior is cooperative.        Thought Content: Thought content normal.        Judgment: Judgment normal.      Assessment and Plan    High risk medication use Counseled patient on limiting use of high risk meds.  She rarely uses hydroxyzine or meclizine for symptoms but likes to have small amount on had.  Anticholinergics are  on [facility]'s list for elderly.. but she would like to continue oxybutynin as it has helped a lot.  She feels Neurontin has not helped but will discuss weaning off with her podiatrist.    [name], MD

## 2020-02-04 DIAGNOSIS — M4627 Osteomyelitis of vertebra, lumbosacral region: Secondary | ICD-10-CM | POA: Diagnosis not present

## 2020-02-04 DIAGNOSIS — Z452 Encounter for adjustment and management of vascular access device: Secondary | ICD-10-CM | POA: Diagnosis not present

## 2020-02-04 DIAGNOSIS — M4647 Discitis, unspecified, lumbosacral region: Secondary | ICD-10-CM | POA: Diagnosis not present

## 2020-02-04 DIAGNOSIS — Z5181 Encounter for therapeutic drug level monitoring: Secondary | ICD-10-CM | POA: Diagnosis not present

## 2020-02-04 DIAGNOSIS — I272 Pulmonary hypertension, unspecified: Secondary | ICD-10-CM | POA: Diagnosis not present

## 2020-02-04 DIAGNOSIS — E119 Type 2 diabetes mellitus without complications: Secondary | ICD-10-CM | POA: Diagnosis not present

## 2020-02-04 DIAGNOSIS — H9313 Tinnitus, bilateral: Secondary | ICD-10-CM | POA: Diagnosis not present

## 2020-02-04 DIAGNOSIS — I10 Essential (primary) hypertension: Secondary | ICD-10-CM | POA: Diagnosis not present

## 2020-02-05 ENCOUNTER — Telehealth: Payer: Self-pay | Admitting: Family Medicine

## 2020-02-05 DIAGNOSIS — M1711 Unilateral primary osteoarthritis, right knee: Secondary | ICD-10-CM | POA: Diagnosis not present

## 2020-02-05 NOTE — Telephone Encounter (Signed)
Patient called stating that she was given medication for a yeast infection and it is not working. She would like [name] to give her a call when she can at [phone].

## 2020-02-08 ENCOUNTER — Telehealth: Payer: Self-pay

## 2020-02-08 ENCOUNTER — Other Ambulatory Visit: Payer: Self-pay | Admitting: Allergy and Immunology

## 2020-02-08 DIAGNOSIS — I10 Essential (primary) hypertension: Secondary | ICD-10-CM | POA: Diagnosis not present

## 2020-02-08 DIAGNOSIS — Z452 Encounter for adjustment and management of vascular access device: Secondary | ICD-10-CM | POA: Diagnosis not present

## 2020-02-08 DIAGNOSIS — M4647 Discitis, unspecified, lumbosacral region: Secondary | ICD-10-CM | POA: Diagnosis not present

## 2020-02-08 DIAGNOSIS — H9313 Tinnitus, bilateral: Secondary | ICD-10-CM | POA: Diagnosis not present

## 2020-02-08 DIAGNOSIS — E119 Type 2 diabetes mellitus without complications: Secondary | ICD-10-CM | POA: Diagnosis not present

## 2020-02-08 DIAGNOSIS — I272 Pulmonary hypertension, unspecified: Secondary | ICD-10-CM | POA: Diagnosis not present

## 2020-02-08 DIAGNOSIS — M4690 Unspecified inflammatory spondylopathy, site unspecified: Secondary | ICD-10-CM | POA: Diagnosis not present

## 2020-02-08 DIAGNOSIS — Z5181 Encounter for therapeutic drug level monitoring: Secondary | ICD-10-CM | POA: Diagnosis not present

## 2020-02-08 NOTE — Telephone Encounter (Signed)
Voicemail:  Beverlee Nims, RN with [facility] called to report patient has blood present in PICC between daily infusions. PICC is clamped.   She has a visit on Wednesday this week and would like the provider to check line.  RN thinks it may have a crack.   Please advise   Laverle Patter, RN

## 2020-02-08 NOTE — Telephone Encounter (Signed)
[name] can she see Dr. Linus Salmons (whom she saw originally?) or Colletta [place] or a partner I am not in clinic for another 2 weeks

## 2020-02-08 NOTE — Telephone Encounter (Signed)
Spoke with [name].  She states infectious disease had to increase her antibiotics and now she is having vaginal itching.  She has been using the cream but that is not helping. She is asking if something else can be prescribed for yeast infection? Please advise.

## 2020-02-09 ENCOUNTER — Telehealth: Payer: Self-pay

## 2020-02-09 MED ORDER — FLUCONAZOLE 150 MG PO TABS
150.0000 mg | ORAL_TABLET | Freq: Once | ORAL | 0 refills | Status: AC
Start: 2020-02-09 — End: 2020-02-09

## 2020-02-09 NOTE — Telephone Encounter (Signed)
Rx sent in as requested. 

## 2020-02-09 NOTE — Telephone Encounter (Signed)
Patient called in stating she would like this medication ASAP. Please advise.

## 2020-02-09 NOTE — Telephone Encounter (Signed)
Verbal orders given to Amy via telephone for [facility] PT 2 x a wk for 1 wk and 1 x a wk for 3 wks for strengthening and mobility per Dr. Randa Spike.

## 2020-02-09 NOTE — Telephone Encounter (Signed)
Amy PT with [facility] left v/m requesting verbal orders for [facility] PT 2 x a wk for 1 wk and 1 x a wk for 3 wks for strengthening and mobility.

## 2020-02-09 NOTE — Telephone Encounter (Signed)
[name] notified by telephone that [name] has been sent to her pharmacy.

## 2020-02-09 NOTE — Telephone Encounter (Signed)
Agree with request.. call with verbal orders.

## 2020-02-10 ENCOUNTER — Other Ambulatory Visit: Payer: Self-pay

## 2020-02-10 ENCOUNTER — Encounter: Payer: Self-pay | Admitting: Internal Medicine

## 2020-02-10 ENCOUNTER — Ambulatory Visit (INDEPENDENT_AMBULATORY_CARE_PROVIDER_SITE_OTHER): Payer: Medicare Other | Admitting: Internal Medicine

## 2020-02-10 VITALS — BP 132/80 | HR 85 | Temp 98.4°F | Wt 212.0 lb

## 2020-02-10 DIAGNOSIS — Z79899 Other long term (current) drug therapy: Secondary | ICD-10-CM

## 2020-02-10 DIAGNOSIS — Z880 Allergy status to penicillin: Secondary | ICD-10-CM

## 2020-02-10 DIAGNOSIS — M4647 Discitis, unspecified, lumbosacral region: Secondary | ICD-10-CM | POA: Diagnosis not present

## 2020-02-10 DIAGNOSIS — Z95828 Presence of other vascular implants and grafts: Secondary | ICD-10-CM

## 2020-02-10 NOTE — Assessment & Plan Note (Signed)
picc line evaluated and I do not see any issues with the tubing.  She has not had the same issue with the line recently so will not change her line.  She will call if she has further concerns.  [place] for TPA as needed.

## 2020-02-10 NOTE — Assessment & Plan Note (Signed)
Trough levels of her vancomycin now in the appropriate range  She will continue with her current dose.

## 2020-02-10 NOTE — Assessment & Plan Note (Signed)
Tolerating ceftriaxone with no rash or concerns.

## 2020-02-10 NOTE — Assessment & Plan Note (Signed)
MRI findings most c/w discitis/osteomyelitis but culture negative and no WBCs.  However, she is improving with empiric antibiotic therapy so overall seems most c/w to be infectious in origin.  She will complete the treatment course through August 12 and stop.  She has follow up with me on 8/10.

## 2020-02-10 NOTE — Progress Notes (Signed)
   Subjective:    Patient ID: [name], female    DOB: 1945-08-10, 74 y.o.   MRN: [id number]  HPI She is here for follow up of discitis.  She underwent disc aspiration and started on empiric antibiotics with vancomycin and ceftriaxone.  Culture and gram stain were negative including no WBCs on the gram stain.  She is projected to be on antibiotics through August 12th.  She is tolerating them well with no associated n/v/d.  No rash.  Inflammatory markers have been wnl.  Her back pain is much improved compared to prior to treatment and she is more active.  She comes in today to reevaluate the picc line. She had noted some blood in the tubing after infusion of the antibiotic and the home health RN was concerned with a crack in the tubing. She changed how she infuses with more flushes and closing of the lock between each activity and has not had that problem since that time.    Review of Systems  Constitutional: Negative for chills and fever.  Gastrointestinal: Negative for diarrhea.  Skin: Negative for rash.       Objective:   Physical Exam Eyes:     General: No scleral icterus. Pulmonary:     Effort: Pulmonary effort is normal.  Skin:    Findings: No rash.  Neurological:     Mental Status: She is alert.  Psychiatric:        Mood and Affect: Mood normal.   SH: no tobacco        Assessment & Plan:

## 2020-02-11 ENCOUNTER — Encounter: Payer: Self-pay | Admitting: Internal Medicine

## 2020-02-11 DIAGNOSIS — E119 Type 2 diabetes mellitus without complications: Secondary | ICD-10-CM | POA: Diagnosis not present

## 2020-02-11 DIAGNOSIS — M4647 Discitis, unspecified, lumbosacral region: Secondary | ICD-10-CM | POA: Diagnosis not present

## 2020-02-11 DIAGNOSIS — I10 Essential (primary) hypertension: Secondary | ICD-10-CM | POA: Diagnosis not present

## 2020-02-11 DIAGNOSIS — H9313 Tinnitus, bilateral: Secondary | ICD-10-CM | POA: Diagnosis not present

## 2020-02-11 DIAGNOSIS — I272 Pulmonary hypertension, unspecified: Secondary | ICD-10-CM | POA: Diagnosis not present

## 2020-02-11 DIAGNOSIS — Z452 Encounter for adjustment and management of vascular access device: Secondary | ICD-10-CM | POA: Diagnosis not present

## 2020-02-11 DIAGNOSIS — Z5181 Encounter for therapeutic drug level monitoring: Secondary | ICD-10-CM | POA: Diagnosis not present

## 2020-02-11 DIAGNOSIS — M4622 Osteomyelitis of vertebra, cervical region: Secondary | ICD-10-CM | POA: Diagnosis not present

## 2020-02-12 DIAGNOSIS — G40909 Epilepsy, unspecified, not intractable, without status epilepticus: Secondary | ICD-10-CM | POA: Diagnosis not present

## 2020-02-12 DIAGNOSIS — Z452 Encounter for adjustment and management of vascular access device: Secondary | ICD-10-CM | POA: Diagnosis not present

## 2020-02-12 DIAGNOSIS — Z792 Long term (current) use of antibiotics: Secondary | ICD-10-CM | POA: Diagnosis not present

## 2020-02-12 DIAGNOSIS — H9313 Tinnitus, bilateral: Secondary | ICD-10-CM | POA: Diagnosis not present

## 2020-02-12 DIAGNOSIS — Z602 Problems related to living alone: Secondary | ICD-10-CM | POA: Diagnosis not present

## 2020-02-12 DIAGNOSIS — Z86718 Personal history of other venous thrombosis and embolism: Secondary | ICD-10-CM | POA: Diagnosis not present

## 2020-02-12 DIAGNOSIS — E119 Type 2 diabetes mellitus without complications: Secondary | ICD-10-CM | POA: Diagnosis not present

## 2020-02-12 DIAGNOSIS — I10 Essential (primary) hypertension: Secondary | ICD-10-CM | POA: Diagnosis not present

## 2020-02-12 DIAGNOSIS — Z7901 Long term (current) use of anticoagulants: Secondary | ICD-10-CM | POA: Diagnosis not present

## 2020-02-12 DIAGNOSIS — Z9181 History of falling: Secondary | ICD-10-CM | POA: Diagnosis not present

## 2020-02-12 DIAGNOSIS — Z8673 Personal history of transient ischemic attack (TIA), and cerebral infarction without residual deficits: Secondary | ICD-10-CM | POA: Diagnosis not present

## 2020-02-12 DIAGNOSIS — J45909 Unspecified asthma, uncomplicated: Secondary | ICD-10-CM | POA: Diagnosis not present

## 2020-02-12 DIAGNOSIS — I272 Pulmonary hypertension, unspecified: Secondary | ICD-10-CM | POA: Diagnosis not present

## 2020-02-12 DIAGNOSIS — Z5181 Encounter for therapeutic drug level monitoring: Secondary | ICD-10-CM | POA: Diagnosis not present

## 2020-02-12 DIAGNOSIS — R519 Headache, unspecified: Secondary | ICD-10-CM | POA: Diagnosis not present

## 2020-02-12 DIAGNOSIS — M4647 Discitis, unspecified, lumbosacral region: Secondary | ICD-10-CM | POA: Diagnosis not present

## 2020-02-12 DIAGNOSIS — Z86711 Personal history of pulmonary embolism: Secondary | ICD-10-CM | POA: Diagnosis not present

## 2020-02-12 DIAGNOSIS — M159 Polyosteoarthritis, unspecified: Secondary | ICD-10-CM | POA: Diagnosis not present

## 2020-02-12 NOTE — Telephone Encounter (Signed)
Patient called in stating she would like to speak with [name] in regards to personal matter they discussed previously. Please advise.

## 2020-02-12 NOTE — Telephone Encounter (Signed)
Spoke with [name].  She took the diflucan but she is still having the burning and itching.  Patient is asking for recommendations.

## 2020-02-12 NOTE — Telephone Encounter (Signed)
Call patient.. have her make an apportionment for vaginal itching  to have wet prep.

## 2020-02-15 ENCOUNTER — Encounter: Payer: Self-pay | Admitting: Internal Medicine

## 2020-02-15 ENCOUNTER — Telehealth: Payer: Self-pay

## 2020-02-15 DIAGNOSIS — Z5181 Encounter for therapeutic drug level monitoring: Secondary | ICD-10-CM | POA: Diagnosis not present

## 2020-02-15 DIAGNOSIS — M4647 Discitis, unspecified, lumbosacral region: Secondary | ICD-10-CM | POA: Diagnosis not present

## 2020-02-15 DIAGNOSIS — H9313 Tinnitus, bilateral: Secondary | ICD-10-CM | POA: Diagnosis not present

## 2020-02-15 DIAGNOSIS — I272 Pulmonary hypertension, unspecified: Secondary | ICD-10-CM | POA: Diagnosis not present

## 2020-02-15 DIAGNOSIS — I10 Essential (primary) hypertension: Secondary | ICD-10-CM | POA: Diagnosis not present

## 2020-02-15 DIAGNOSIS — E119 Type 2 diabetes mellitus without complications: Secondary | ICD-10-CM | POA: Diagnosis not present

## 2020-02-15 DIAGNOSIS — Z452 Encounter for adjustment and management of vascular access device: Secondary | ICD-10-CM | POA: Diagnosis not present

## 2020-02-15 NOTE — Telephone Encounter (Signed)
Left message for [name] to please call the office and schedule appointment with Dr. Diona Browner.

## 2020-02-15 NOTE — Telephone Encounter (Signed)
Appointment scheduled for 02/16/2020 at 10:40 am with Dr. Diona Browner.

## 2020-02-15 NOTE — Telephone Encounter (Signed)
PA for Pantoprazole 40 mg was initiated through [email or web address] and awaiting approval.

## 2020-02-15 NOTE — Telephone Encounter (Signed)
PA was approved and Pharmacy notified via fax to [facility] at [phone]

## 2020-02-15 NOTE — Telephone Encounter (Signed)
Patient contacted the office and states that she needs [name] to give her a call back. She would not relay information to me.   She can be reached at [phone]

## 2020-02-15 NOTE — Telephone Encounter (Signed)
See phone encounter for 02/12/2020.

## 2020-02-16 ENCOUNTER — Ambulatory Visit (INDEPENDENT_AMBULATORY_CARE_PROVIDER_SITE_OTHER): Payer: Medicare Other | Admitting: Family Medicine

## 2020-02-16 ENCOUNTER — Encounter: Payer: Self-pay | Admitting: Family Medicine

## 2020-02-16 ENCOUNTER — Other Ambulatory Visit: Payer: Self-pay

## 2020-02-16 VITALS — BP 110/70 | HR 78 | Temp 98.4°F | Ht 64.5 in | Wt 210.5 lb

## 2020-02-16 DIAGNOSIS — Z452 Encounter for adjustment and management of vascular access device: Secondary | ICD-10-CM | POA: Insufficient documentation

## 2020-02-16 DIAGNOSIS — J4550 Severe persistent asthma, uncomplicated: Secondary | ICD-10-CM | POA: Diagnosis not present

## 2020-02-16 DIAGNOSIS — N898 Other specified noninflammatory disorders of vagina: Secondary | ICD-10-CM | POA: Diagnosis not present

## 2020-02-16 NOTE — Progress Notes (Signed)
Chief Complaint  Patient presents with  . Vaginal Itching    History of Present Illness: HPI   74 year old postmenopausal female presents for vaginal itching.  She is on PICC antibiotics for lumbosacral discitis.Marland Kitchen may be completed on 02/23/2020  Now in last week vaginal itching  She has had issues in past as well with intermittent vaginal itching.   No odor, no discharge.   She has had minimal improvement with fluconazole tab.  She has raw area near rectum from more bowel movements with antibiotics.     This visit occurred during the SARS-CoV-2 public health emergency.  Safety protocols were in place, including screening questions prior to the visit, additional usage of staff PPE, and extensive cleaning of exam room while observing appropriate contact time as indicated for disinfecting solutions.   COVID 19 screen:  No recent travel or known exposure to COVID19 The patient denies respiratory symptoms of COVID 19 at this time. The importance of social distancing was discussed today.     Review of Systems  Constitutional: Negative for chills and fever.  HENT: Negative for congestion and ear pain.   Eyes: Negative for pain and redness.  Respiratory: Negative for cough and shortness of breath.   Cardiovascular: Negative for chest pain, palpitations and leg swelling.  Gastrointestinal: Negative for abdominal pain, blood in stool, constipation, diarrhea, nausea and vomiting.  Genitourinary: Negative for dysuria.  Musculoskeletal: Negative for falls and myalgias.  Skin: Negative for rash.  Neurological: Negative for dizziness.  Psychiatric/Behavioral: Negative for depression. The patient is not nervous/anxious.       Past Medical History:  Diagnosis Date  . Allergic rhinitis   . Allergy   . Arthritis   . Asthma   . Chronic headache   . Diabetes mellitus   . DVT (deep venous thrombosis) ([place])   . Dyspnea   . Heart murmur   . Hypertension   . Penicillin allergy 01/19/2020   . Pulmonary embolism ([place])   . Pulmonary hypertension ([place])   . Seizures ([place])     reports that she has never smoked. She has never used smokeless tobacco. She reports previous alcohol use. She reports that she does not use drugs.   Current Outpatient Medications:  .  acetaminophen (TYLENOL) 650 MG CR tablet, Take 1,300 mg by mouth daily as needed for pain., Disp: , Rfl:  .  ADVAIR HFA 846-96 MCG/ACT inhaler, INHALE 2 PUFFS BY MOUTH TWICE DAILY TO PREVENT COUGH OR WHEEZING, RINSE MOUTH AFTER USE, Disp: 12 g, Rfl: 4 .  antiseptic oral rinse (BIOTENE) LIQD, 15 mLs by Mouth Rinse route in the morning and at bedtime., Disp: , Rfl:  .  Apoaequorin (PREVAGEN) 10 MG CAPS, Take 10 mg by mouth daily., Disp: , Rfl:  .  Artificial Saliva (BIOTENE DRY MOUTH) LOZG, Use as directed 1 lozenge in the mouth or throat daily as needed (dry eyes)., Disp: , Rfl:  .  atorvastatin (LIPITOR) 10 MG tablet, Take 1 tablet (10 mg total) by mouth daily., Disp: 90 tablet, Rfl: 3 .  azelastine (ASTELIN) 0.1 % nasal spray, USE 1-2 SPRAYS IN EACH NOSTRIL TWICE DAILY AS NEEDED (Patient taking differently: Place 2 sprays into both nostrils 2 (two) times daily as needed for allergies. ), Disp: 30 mL, Rfl: 5 .  Capsaicin-Menthol (SALONPAS GEL EX), Apply 1 application topically daily as needed (pain)., Disp: , Rfl:  .  Coenzyme Q10 (COQ-10) 100 MG CAPS, Take 100 mg by mouth daily., Disp: , Rfl:  .  cyclobenzaprine (FLEXERIL) 10 MG tablet, Take 1 tablet (10 mg total) by mouth at bedtime as needed for muscle spasms., Disp: 30 tablet, Rfl: 1 .  dextromethorphan-guaiFENesin (MUCINEX DM) 30-600 MG 12hr tablet, Take 1 tablet by mouth 2 (two) times daily as needed for cough., Disp: , Rfl:  .  Empagliflozin-linaGLIPtin (GLYXAMBI) 10-5 MG TABS, Take 1 tablet by mouth daily., Disp: 30 tablet, Rfl: 11 .  EPINEPHrine 0.3 mg/0.3 mL IJ SOAJ injection, USE AS DIRECTED FOR LIFE THREATENING ALLERGIC REACTIONS (Patient taking differently: Inject 0.3  mg into the muscle as needed for anaphylaxis. ), Disp: 2 each, Rfl: 2 .  famotidine (PEPCID) 40 MG tablet, TAKE 1 TABLET(40 MG) BY MOUTH DAILY (Patient taking differently: Take 40 mg by mouth daily. ), Disp: 90 tablet, Rfl: 1 .  fluticasone (FLOVENT HFA) 220 MCG/ACT inhaler, Inhale two puffs twice daily during flare-up as needed.  Rinse, gargle, and spit after use. (Patient taking differently: Inhale 2 puffs into the lungs 2 (two) times daily as needed (shortness of breath). Rinse, gargle, and spit after use.), Disp: 12 g, Rfl: 5 .  furosemide (LASIX) 40 MG tablet, TAKE 1 TABLET(40 MG) BY MOUTH DAILY (Patient taking differently: Take 40 mg by mouth daily as needed for edema. ), Disp: 30 tablet, Rfl: 3 .  gabapentin (NEURONTIN) 600 MG tablet, TAKE 1 TABLET BY MOUTH DAILY AT BREAKFAST, 1 TABLET AT LUNCH, AND 2 TABLETS AT BEDTIME (Patient taking differently: Take 600-1,200 mg by mouth See admin instructions. Take 600 mg in the morning, 600 mg with lunch, and 1200 mg at night), Disp: 360 tablet, Rfl: 1 .  Garlic (GARLIQUE PO), Take 1 tablet by mouth daily., Disp: , Rfl:  .  Homeopathic Products [facility] RELIEF EX), Apply 1 spray topically daily as needed (muscle cramps)., Disp: , Rfl:  .  hydrOXYzine (ATARAX/VISTARIL) 10 MG tablet, Take 1 tablet (10 mg total) by mouth daily as needed for itching., Disp: 15 tablet, Rfl: 0 .  ipratropium (ATROVENT) 0.06 % nasal spray, Place 1-2 sprays each nostril 1-2 times per day (Patient taking differently: Place 2 sprays into both nostrils 2 (two) times daily as needed for rhinitis. ), Disp: 15 mL, Rfl: 5 .  irbesartan (AVAPRO) 150 MG tablet, TAKE 1 TABLET(150 MG) BY MOUTH DAILY (Patient taking differently: Take 150 mg by mouth daily. ), Disp: 30 tablet, Rfl: 9 .  Lactase (DAIRY-RELIEF PO), Take 1 capsule by mouth daily as needed (eating dairy). , Disp: , Rfl:  .  levalbuterol (XOPENEX) 1.25 MG/3ML nebulizer solution, USE 1 VIAL VIA NEBULIZER EVERY 6 HOURS AS NEEDED FOR  WHEEZING OR SHORTNESS OF BREATH (Patient taking differently: Take 1.25 mg by nebulization every 6 (six) hours as needed for wheezing or shortness of breath. ), Disp: 9 mL, Rfl: 1 .  meclizine (ANTIVERT) 12.5 MG tablet, Take 1 tablet (12.5 mg total) by mouth 3 (three) times daily as needed for dizziness., Disp: 15 tablet, Rfl: 0 .  methimazole (TAPAZOLE) 5 MG tablet, TAKE 1 TABLET BY MOUTH IN THE MORNING AND 1/2 TABLET IN THE EVENING WITH MEALS, Disp: 225 tablet, Rfl: 1 .  Misc Natural Products (GLUCOSAMINE CHONDROITIN TRIPLE) TABS, Take 2 tablets by mouth daily., Disp: , Rfl:  .  montelukast (SINGULAIR) 10 MG tablet, Take 1 tablet (10 mg total) by mouth daily., Disp: 90 tablet, Rfl: 1 .  nystatin (MYCOSTATIN) 100000 UNIT/ML suspension, SWISH AND SWALLOW 5 ML BY MOUTH TWICE DAILY AFTER ADVAIR, Disp: 60 mL, Rfl: 5 .  omalizumab (XOLAIR) 150  MG injection, Inject 300 mg into the skin every 28 (twenty-eight) days., Disp: , Rfl:  .  ondansetron (ZOFRAN-ODT) 4 MG disintegrating tablet, Take 4 mg by mouth every 8 (eight) hours as needed for nausea or vomiting. , Disp: , Rfl:  .  OneTouch Delica Lancets 73A MISC, Use to check blood sugar up to 2 times a day., Disp: 100 each, Rfl: 5 .  ONETOUCH VERIO test strip, Use to check blood sugar up to 2 times a day, Disp: 100 each, Rfl: 5 .  oxybutynin (DITROPAN-XL) 5 MG 24 hr tablet, Take 5 mg by mouth daily., Disp: , Rfl:  .  pantoprazole (PROTONIX) 40 MG tablet, TAKE 1 TABLET(40 MG) BY MOUTH DAILY (Patient taking differently: Take 40 mg by mouth daily. ), Disp: 90 tablet, Rfl: 1 .  PATADAY 0.2 % SOLN, Place 1 drop into both eyes daily as needed (allergies). , Disp: , Rfl: 4 .  Polyethyl Glycol-Propyl Glycol (SYSTANE OP), Place 1 drop into both eyes 2 (two) times daily., Disp: , Rfl:  .  Potassium 99 MG TABS, Take 99 mg by mouth daily., Disp: , Rfl:  .  Probiotic Product (ALIGN) 4 MG CAPS, Take 4 mg by mouth daily. , Disp: , Rfl:  .  Pumpkin Seed-Soy Germ (AZO  BLADDER CONTROL/GO-LESS) CAPS, Take 1 tablet by mouth 2 (two) times daily., Disp: , Rfl:  .  rivaroxaban (XARELTO) 20 MG TABS tablet, Take 1 tablet (20 mg total) by mouth daily with supper., Disp: 90 tablet, Rfl: 1 .  Tiotropium Bromide Monohydrate (SPIRIVA RESPIMAT) 1.25 MCG/ACT AERS, Inhale 2 puffs into the lungs daily., Disp: 4 g, Rfl: 5 .  triamcinolone cream (KENALOG) 0.1 %, Apply 1 application topically 2 (two) times daily. (Patient taking differently: Apply 1 application topically 2 (two) times daily as needed (itching). ), Disp: 30 g, Rfl: 0 .  TRULANCE 3 MG TABS, Take 3 mg by mouth daily. , Disp: , Rfl:  .  TURMERIC PO, Take 1,000 mg by mouth daily., Disp: , Rfl:  .  VOLTAREN 1 % GEL, Apply 2 g topically 4 (four) times daily. (Patient taking differently: Apply 2 g topically 4 (four) times daily as needed (pain). ), Disp: 100 g, Rfl: 2  Current Facility-Administered Medications:  .  omalizumab Arvid Right) injection 300 mg, 300 mg, Subcutaneous, Q28 days, Kozlow, Donnamarie Poag, MD, 300 mg at 01/20/20 1326   Observations/Objective: Blood pressure 110/70, pulse 78, temperature 98.4 F (36.9 C), temperature source Temporal, height 5' 4.5" (1.638 m), weight 210 lb 8 oz (95.5 kg), SpO2 97 %.  Physical Exam Exam conducted with a chaperone present.  Constitutional:      Appearance: She is obese.     Comments: PICC in place in right arm  Genitourinary:    Labia:        Right: No rash or tenderness.        Left: No rash or tenderness.      Vagina: No vaginal discharge, erythema or tenderness.     Adnexa: Right adnexa normal and left adnexa normal.     Comments:  Vaginal dryness, postmenopausal cahnges     Assessment and Plan   Vaginal itching  No improvement with fluconazole.  Will send out wet prep given on antibitoics and yeast possible.  Use barrier cream for rectal area.  MAy need estrogen for post menopausal dryness.     [name], MD

## 2020-02-16 NOTE — Patient Instructions (Addendum)
We will call with results.  Can apply Desitin or other barrier cream to help rectal irritation from frequent bowel movements.

## 2020-02-16 NOTE — Assessment & Plan Note (Signed)
No improvement with fluconazole.  Will send out wet prep given on antibitoics and yeast possible.  Use barrier cream for rectal area.  MAy need estrogen for post menopausal dryness.

## 2020-02-17 ENCOUNTER — Ambulatory Visit (INDEPENDENT_AMBULATORY_CARE_PROVIDER_SITE_OTHER): Payer: Medicare Other | Admitting: *Deleted

## 2020-02-17 DIAGNOSIS — J4550 Severe persistent asthma, uncomplicated: Secondary | ICD-10-CM | POA: Diagnosis not present

## 2020-02-17 LAB — WET PREP BY MOLECULAR PROBE
Candida species: NOT DETECTED
Gardnerella vaginalis: NOT DETECTED
MICRO NUMBER:: 10781583
SPECIMEN QUALITY:: ADEQUATE
Trichomonas vaginosis: NOT DETECTED

## 2020-02-18 ENCOUNTER — Encounter: Payer: Self-pay | Admitting: Internal Medicine

## 2020-02-18 DIAGNOSIS — I10 Essential (primary) hypertension: Secondary | ICD-10-CM | POA: Diagnosis not present

## 2020-02-18 DIAGNOSIS — H9313 Tinnitus, bilateral: Secondary | ICD-10-CM | POA: Diagnosis not present

## 2020-02-18 DIAGNOSIS — E119 Type 2 diabetes mellitus without complications: Secondary | ICD-10-CM | POA: Diagnosis not present

## 2020-02-18 DIAGNOSIS — Z452 Encounter for adjustment and management of vascular access device: Secondary | ICD-10-CM | POA: Diagnosis not present

## 2020-02-18 DIAGNOSIS — I272 Pulmonary hypertension, unspecified: Secondary | ICD-10-CM | POA: Diagnosis not present

## 2020-02-18 DIAGNOSIS — M4647 Discitis, unspecified, lumbosacral region: Secondary | ICD-10-CM | POA: Diagnosis not present

## 2020-02-18 MED ORDER — ESTRADIOL 0.1 MG/GM VA CREA
TOPICAL_CREAM | VAGINAL | 12 refills | Status: DC
Start: 2020-02-18 — End: 2020-09-26

## 2020-02-18 NOTE — Addendum Note (Signed)
Addended by: [name] E on: 02/18/2020 08:27 AM   Modules accepted: Orders

## 2020-02-19 ENCOUNTER — Encounter: Payer: Self-pay | Admitting: Internal Medicine

## 2020-02-19 DIAGNOSIS — I10 Essential (primary) hypertension: Secondary | ICD-10-CM | POA: Diagnosis not present

## 2020-02-19 DIAGNOSIS — Z5181 Encounter for therapeutic drug level monitoring: Secondary | ICD-10-CM | POA: Diagnosis not present

## 2020-02-19 DIAGNOSIS — M4647 Discitis, unspecified, lumbosacral region: Secondary | ICD-10-CM | POA: Diagnosis not present

## 2020-02-19 DIAGNOSIS — H9313 Tinnitus, bilateral: Secondary | ICD-10-CM | POA: Diagnosis not present

## 2020-02-19 DIAGNOSIS — Z452 Encounter for adjustment and management of vascular access device: Secondary | ICD-10-CM | POA: Diagnosis not present

## 2020-02-19 DIAGNOSIS — I272 Pulmonary hypertension, unspecified: Secondary | ICD-10-CM | POA: Diagnosis not present

## 2020-02-19 DIAGNOSIS — E119 Type 2 diabetes mellitus without complications: Secondary | ICD-10-CM | POA: Diagnosis not present

## 2020-02-22 ENCOUNTER — Encounter: Payer: Self-pay | Admitting: Internal Medicine

## 2020-02-22 DIAGNOSIS — Z452 Encounter for adjustment and management of vascular access device: Secondary | ICD-10-CM | POA: Diagnosis not present

## 2020-02-22 DIAGNOSIS — I10 Essential (primary) hypertension: Secondary | ICD-10-CM | POA: Diagnosis not present

## 2020-02-22 DIAGNOSIS — M4647 Discitis, unspecified, lumbosacral region: Secondary | ICD-10-CM | POA: Diagnosis not present

## 2020-02-22 DIAGNOSIS — H9313 Tinnitus, bilateral: Secondary | ICD-10-CM | POA: Diagnosis not present

## 2020-02-22 DIAGNOSIS — E119 Type 2 diabetes mellitus without complications: Secondary | ICD-10-CM | POA: Diagnosis not present

## 2020-02-22 DIAGNOSIS — I272 Pulmonary hypertension, unspecified: Secondary | ICD-10-CM | POA: Diagnosis not present

## 2020-02-22 DIAGNOSIS — Z5181 Encounter for therapeutic drug level monitoring: Secondary | ICD-10-CM | POA: Diagnosis not present

## 2020-02-23 ENCOUNTER — Other Ambulatory Visit: Payer: Self-pay

## 2020-02-23 ENCOUNTER — Telehealth: Payer: Self-pay

## 2020-02-23 ENCOUNTER — Telehealth: Payer: Self-pay | Admitting: Allergy and Immunology

## 2020-02-23 ENCOUNTER — Ambulatory Visit (INDEPENDENT_AMBULATORY_CARE_PROVIDER_SITE_OTHER): Payer: Medicare Other | Admitting: Internal Medicine

## 2020-02-23 ENCOUNTER — Ambulatory Visit: Payer: Medicare Other | Admitting: Internal Medicine

## 2020-02-23 ENCOUNTER — Encounter: Payer: Self-pay | Admitting: Internal Medicine

## 2020-02-23 VITALS — BP 125/65 | HR 71 | Temp 97.8°F | Wt 213.0 lb

## 2020-02-23 DIAGNOSIS — Z79899 Other long term (current) drug therapy: Secondary | ICD-10-CM | POA: Diagnosis not present

## 2020-02-23 DIAGNOSIS — M4647 Discitis, unspecified, lumbosacral region: Secondary | ICD-10-CM

## 2020-02-23 DIAGNOSIS — Z452 Encounter for adjustment and management of vascular access device: Secondary | ICD-10-CM | POA: Diagnosis not present

## 2020-02-23 NOTE — Assessment & Plan Note (Signed)
Home health will have this removed after her last dose 8/12.

## 2020-02-23 NOTE — Telephone Encounter (Signed)
Patient called and would like for you to call her about a test that disease dr. Geraldine Solar to have [place] do. [phone].

## 2020-02-23 NOTE — Telephone Encounter (Signed)
Spoke with [name].  She states she picked up the estrace cream but when she was reading the patient information sheet that came with it she got concerned because it mentioned multiple times that this medication can cause cancer.  She states it also mentioned it could cause stroke and heart attacks. Please advise.  Patient wants to know what she should do/use.

## 2020-02-23 NOTE — Telephone Encounter (Signed)
Ms. Mallinger notified as instructed by telephone.  Patient states understanding. 

## 2020-02-23 NOTE — Telephone Encounter (Signed)
Called [facility] on 02/23/2020 at 11:01 a.m. I spoke with Jackelyn Poling to confirm, Per Dr. Henreitta Leber order to pull PICC after last dose on 02/25/2020. This was confirmed with Debbie. Consuelo Pandy

## 2020-02-23 NOTE — Assessment & Plan Note (Signed)
Creat has been stable and trough levels noted.

## 2020-02-23 NOTE — Assessment & Plan Note (Signed)
This has responded well clinically to the antibiotic treatment and appears healed.  She is going to finish her last 2 days and then stop and will observe off of antibiotics.  She will call if she develops any new concerning signs, otherwise will follow up as needed.  She also follows up with Dr. Gladstone Lighter this week.

## 2020-02-23 NOTE — Telephone Encounter (Signed)
Pt was seen last week and Dr Diona Browner mentioned a vaginal cream. Would prefr to talk to [name] and ask question.

## 2020-02-23 NOTE — Telephone Encounter (Signed)
I can call her when  I am back in office if needed.  You can let her know  ( as I already did) that this is an estrogen cream.. estrogen has some increased risk of cardiovascular issues a well as breast/endometrial cancer.. mainly when used orally, but to a much more limited extent topically. She would use this medication daily initially and then would wean down over time. The risk when used in this way is low, but if she prefers to not use, she does not have to. This is the only major solution to vaginal dryness and irritation that I have to offer given it seems to be due to postmenopausal lack of estrogen changes. She could try vaginal lubricant like olive poil KY jelly etc pr get a second opinion from a gynecologist if she would like.

## 2020-02-23 NOTE — Progress Notes (Signed)
   Subjective:    Patient ID: [name], female    DOB: 1946-03-03, 74 y.o.   MRN: [id number]  HPI She is here for follow up of discitis.  She underwent disc aspiration and started on empiric antibiotics with vancomycin and ceftriaxone.  Culture and gram stain were negative including no WBCs on the gram stain.  She is projected to be on antibiotics through August 12th.  Inflammatory markers have been wnl.  Her back pain is much improved compared to prior to treatment and she is more active.   She continues to do well with less pain and is mainly in her hips rather than her back as it was before.  No associated rash or diarrhea. Recent labs reviewed and has a normal creat.    Review of Systems  Constitutional: Negative for chills and fever.  Gastrointestinal: Negative for diarrhea.  Skin: Negative for rash.       Objective:   Physical Exam Eyes:     General: No scleral icterus. Pulmonary:     Effort: Pulmonary effort is normal.  Skin:    Findings: No rash.  Neurological:     Mental Status: She is alert.  Psychiatric:        Mood and Affect: Mood normal.   SH: no tobacco        Assessment & Plan:

## 2020-02-24 DIAGNOSIS — Z452 Encounter for adjustment and management of vascular access device: Secondary | ICD-10-CM | POA: Diagnosis not present

## 2020-02-24 DIAGNOSIS — M4647 Discitis, unspecified, lumbosacral region: Secondary | ICD-10-CM | POA: Diagnosis not present

## 2020-02-24 DIAGNOSIS — H9313 Tinnitus, bilateral: Secondary | ICD-10-CM | POA: Diagnosis not present

## 2020-02-24 DIAGNOSIS — I10 Essential (primary) hypertension: Secondary | ICD-10-CM | POA: Diagnosis not present

## 2020-02-24 DIAGNOSIS — E119 Type 2 diabetes mellitus without complications: Secondary | ICD-10-CM | POA: Diagnosis not present

## 2020-02-24 DIAGNOSIS — I272 Pulmonary hypertension, unspecified: Secondary | ICD-10-CM | POA: Diagnosis not present

## 2020-02-25 ENCOUNTER — Ambulatory Visit: Payer: Medicare Other | Admitting: Podiatry

## 2020-02-25 ENCOUNTER — Telehealth: Payer: Self-pay | Admitting: Family Medicine

## 2020-02-25 DIAGNOSIS — Z452 Encounter for adjustment and management of vascular access device: Secondary | ICD-10-CM | POA: Diagnosis not present

## 2020-02-25 DIAGNOSIS — H9313 Tinnitus, bilateral: Secondary | ICD-10-CM | POA: Diagnosis not present

## 2020-02-25 DIAGNOSIS — E119 Type 2 diabetes mellitus without complications: Secondary | ICD-10-CM | POA: Diagnosis not present

## 2020-02-25 DIAGNOSIS — I272 Pulmonary hypertension, unspecified: Secondary | ICD-10-CM | POA: Diagnosis not present

## 2020-02-25 DIAGNOSIS — M4647 Discitis, unspecified, lumbosacral region: Secondary | ICD-10-CM | POA: Diagnosis not present

## 2020-02-25 DIAGNOSIS — I10 Essential (primary) hypertension: Secondary | ICD-10-CM | POA: Diagnosis not present

## 2020-02-25 NOTE — Telephone Encounter (Signed)
Pt called wanting to let donna She would like to know what creams dr Diona Browner recommended.  She talked to you about this the other week

## 2020-02-26 DIAGNOSIS — H43812 Vitreous degeneration, left eye: Secondary | ICD-10-CM | POA: Diagnosis not present

## 2020-02-26 DIAGNOSIS — M1711 Unilateral primary osteoarthritis, right knee: Secondary | ICD-10-CM | POA: Diagnosis not present

## 2020-02-29 NOTE — Telephone Encounter (Signed)
Spoke with [name].  She had forgotten what I had told her she could use last week when we spoke.  Patient was advised that if she didn't want to use the estrogen cream, she could try a vaginal lubricant like olive oil or KY jelly.

## 2020-02-29 NOTE — Telephone Encounter (Signed)
I believe you already notified her on 8/10 with the following but this is my recommendations:    You can let her know that this is an estrogen cream.. estrogen has some increased risk of cardiovascular issues a well as breast/endometrial cancer.. mainly when used orally, but to a much more limited extent topically. She would use this medication daily initially and then would wean down over time. The risk when used in this way is low, but if she prefers to not use, she does not have to. This is the only major solution to vaginal dryness and irritation that I have to offer given it seems to be due to postmenopausal lack of estrogen changes. She could try vaginal lubricant like olive oil KY jelly etc pr get a second opinion from a gynecologist if she would like.  Let me know what she wishes to do.

## 2020-03-02 ENCOUNTER — Telehealth: Payer: Medicare Other

## 2020-03-03 DIAGNOSIS — Z452 Encounter for adjustment and management of vascular access device: Secondary | ICD-10-CM | POA: Diagnosis not present

## 2020-03-03 DIAGNOSIS — I10 Essential (primary) hypertension: Secondary | ICD-10-CM | POA: Diagnosis not present

## 2020-03-03 DIAGNOSIS — E119 Type 2 diabetes mellitus without complications: Secondary | ICD-10-CM | POA: Diagnosis not present

## 2020-03-03 DIAGNOSIS — I272 Pulmonary hypertension, unspecified: Secondary | ICD-10-CM | POA: Diagnosis not present

## 2020-03-03 DIAGNOSIS — H9313 Tinnitus, bilateral: Secondary | ICD-10-CM | POA: Diagnosis not present

## 2020-03-03 DIAGNOSIS — M4647 Discitis, unspecified, lumbosacral region: Secondary | ICD-10-CM | POA: Diagnosis not present

## 2020-03-06 ENCOUNTER — Other Ambulatory Visit: Payer: Self-pay | Admitting: Cardiovascular Disease

## 2020-03-08 ENCOUNTER — Telehealth: Payer: Self-pay | Admitting: Internal Medicine

## 2020-03-08 ENCOUNTER — Telehealth: Payer: Self-pay

## 2020-03-08 NOTE — Chronic Care Management (AMB) (Signed)
Chronic Care Management Pharmacy  Name: [name]  MRN: [id number] DOB: Nov 12, 1945  Chief Complaint/ HPI  [name],  74 y.o., female presents for their Follow-Up CCM visit with the clinical pharmacist via telephone.  PCP : [name], MD  Their chronic conditions include: hypertension, afib, migraine headache, asthma, allergic rhinitis, sleep apnea, GERD, diabetes, neuropathy, Graves disease, osteoarthritis, depression, chronic pain, urge incontinence, high cholesterol   03/11/2020 - Changes since last CCM visit - Patient has stopped taking acetaminophen with codeine and replaced with acetaminophen over the counter. Checking blood sugars in the morning 112-120 mg/dL. Patient's exercise has consisted of therapeutic exercises lately. Now that PICC line removed she hopes to resume walking outdoors with her dog when the weather cools down. She is avoiding the gym due to [place] concerns. Is eating lots of vegetables. Eating a lot of baked and grilled fish.   Office Visits  02/16/2020: Diona Browner - Patient advised to try KY jelly for vaginal dryness. Patient picked up a prescription for estrace cream but was concerned since risk of cancer on patient education.   02/02/2020: [place] patient on limiting use of high risk meds. She rarely uses hydroxyzine or meclizine for symptoms but likes to have small amount on had.  Anticholinergics are  on Beer's list for elderly.. but she would like to continue oxybutynin as it has helped a lot.  07/08/2021Diona Browner - Try a trial off of magnesium for  Improvement in GI symptoms.Continue tylenol for pain. Well controlled Diabetes, hypertension and hyperlipidemia.   01/06/2020: [place] - On exam, consistent with lymphadenopathy.  No other signs or symptoms.  It is improved over the last 2 days. She did have her COVID-19 vaccine 2 days prior to our exam. Consult Visit:  02/23/2020: [place] ID - This has responded well clinically to the  antibiotic treatment and appears healed.  She is going to finish her last 2 days and then stop and will observe off of antibiotics.  She will call if she develops any new concerning signs, otherwise will follow up as needed.  She also follows up with Dr. Gladstone Lighter this week.  02/10/2020: Linus Salmons ID - Trough levels of her vancomycin now in the appropriate range.She will continue with her current dose.  MRI findings most c/w discitis/osteomyelitis but culture negative and no WBCs.  01/19/2020: Lucianne [place] ID - Continue vancomycin and ceftriaxone to complete at least 6 weeks of therapy.  I have scheduled her with Dr. De Burrs on 10 August which is 2 days prior to her stop date.  01/05/2020: Podiatry - Debridement of toenails 1 through 5 bilaterally.  Follow-up with her in a few weeks  01/04/2020: ID [place] - Discitis/osteomyelitis - empiric vancomycin and ceftriaxone.   Changes since last CCM visit on 11/02/19 - PharmD consulted PCP to begin atorvastatin 10 mg daily, pt had asthma flare up, started on prednisone x 10 days   Office Visits: Last CCM visit 11/02/19  12/01/19: Allergy/Asthma - pt called with wheezing/SOB/sputum, advised to take prednisone for 10 days and add Flovent until symptoms resolved  11/24/19: Allergy/Asthma - cont current meds  Consult Visit:  10/20/19: Endocrinology - thyrotoxicosis/thyroidnodule, decreased methimazole to 5 mg every other day with meal 09/2018, continues on diltiazem CD, rtc 6 months  10/06/19: Immunotherapy - asthma, Xolair injection  09/29/19: Podiatry - foot exam/debridement  08/04/19: Telephone (pulmonology) - question regarding 2 very similar inhalers and thrush, may try Advair once daily along with Spiriva due to hoaresness  07/21/19: Pulmonology - thrush - continue Advair 2 puffs BID, start Nystatin after Advair BID, continue Spiriva 2 puffs daily, montelukast, Xolair, and Epi-Pen, PRN - saline spray, Mucinex DM BID, azelastine nasal spray BID, ipratropium nasal spray-  BID PRN, albuterol nebulizer or inhaler PRN  - add Flovent 220 - 2 puffs BID for flare ups   04/23/19: Cardiology - cont current meds  Allergies  Allergen Reactions  . Latex Hives  . Penicillins Swelling    Has patient had a PCN reaction causing immediate rash, facial/tongue/throat swelling, SOB or lightheadedness with hypotension patient had a PCN reaction causing severe rash involving mucus membranes or skin necrosis: [id number] Has patient had a PCN reaction that required hospitalization/No Has patient had a PCN reaction occurring within the last 10 years: No If all of the above answers are "NO", then may proceed with Cephalosporin use.     Recardo Evangelist [Pregabalin] Other (See Comments)    Blurred vision.  Marland Kitchen Phenytoin Sodium Extended Swelling  . Vicodin [Hydrocodone-Acetaminophen] Nausea And Vomiting   Medications: Outpatient Encounter Medications as of 03/11/2020  Medication Sig Note  . acetaminophen (TYLENOL) 650 MG CR tablet Take 1,300 mg by mouth daily as needed for pain. 01/06/2020: Pt doesn't take both the tylenol and the tylenol 3 together  . ADVAIR HFA 230-21 MCG/ACT inhaler INHALE 2 PUFFS BY MOUTH TWICE DAILY TO PREVENT COUGH OR WHEEZING, RINSE MOUTH AFTER USE   . albuterol (VENTOLIN HFA) 108 (90 Base) MCG/ACT inhaler INHALE 2 PUFFS BY MOUTH INTO THE LUNGS EVERY 6 HOURS AS NEEDED FOR WHEEZING OR SHORTNESS OF BREATH   . antiseptic oral rinse (BIOTENE) LIQD 15 mLs by Mouth Rinse route in the morning and at bedtime.   Marland Kitchen Apoaequorin (PREVAGEN) 10 MG CAPS Take 10 mg by mouth daily.   . Artificial Saliva (BIOTENE DRY MOUTH) LOZG Use as directed 1 lozenge in the mouth or throat daily as needed (dry eyes).   Marland Kitchen atorvastatin (LIPITOR) 10 MG tablet Take 1 tablet (10 mg total) by mouth daily.   Marland Kitchen azelastine (ASTELIN) 0.1 % nasal spray USE 1-2 SPRAYS IN EACH NOSTRIL TWICE DAILY AS NEEDED (Patient taking differently: Place 2 sprays into both nostrils 2 (two) times daily as needed for  allergies. )   . Capsaicin-Menthol (SALONPAS GEL EX) Apply 1 application topically daily as needed (pain).   . Coenzyme Q10 (COQ-10) 100 MG CAPS Take 100 mg by mouth daily.   . cyclobenzaprine (FLEXERIL) 10 MG tablet Take 1 tablet (10 mg total) by mouth at bedtime as needed for muscle spasms.   Marland Kitchen dextromethorphan-guaiFENesin (MUCINEX DM) 30-600 MG 12hr tablet Take 1 tablet by mouth 2 (two) times daily as needed for cough.   . Empagliflozin-linaGLIPtin (GLYXAMBI) 10-5 MG TABS Take 1 tablet by mouth daily.   Marland Kitchen EPINEPHrine 0.3 mg/0.3 mL IJ SOAJ injection USE AS DIRECTED FOR LIFE THREATENING ALLERGIC REACTIONS (Patient taking differently: Inject 0.3 mg into the muscle as needed for anaphylaxis. )   . estradiol (ESTRACE) 0.1 MG/GM vaginal cream 0.5 g of cream intravaginally administered daily for 2 weeks, then reduce to twice weekly   . famotidine (PEPCID) 40 MG tablet TAKE 1 TABLET(40 MG) BY MOUTH DAILY (Patient taking differently: Take 40 mg by mouth daily. )   . fluticasone (FLOVENT HFA) 220 MCG/ACT inhaler Inhale two puffs twice daily during flare-up as needed.  Rinse, gargle, and spit after use. (Patient taking differently: Inhale 2 puffs into the lungs 2 (two) times daily as needed (shortness of breath).  Rinse, gargle, and spit after use.)   . furosemide (LASIX) 40 MG tablet TAKE 1 TABLET(40 MG) BY MOUTH DAILY (Patient taking differently: Take 40 mg by mouth daily as needed for edema. )   . gabapentin (NEURONTIN) 600 MG tablet TAKE 1 TABLET BY MOUTH DAILY AT BREAKFAST, 1 TABLET AT LUNCH, AND 2 TABLETS AT BEDTIME (Patient taking differently: Take 600-1,200 mg by mouth See admin instructions. Take 600 mg in the morning, 600 mg with lunch, and 1200 mg at night)   . Garlic (GARLIQUE PO) Take 1 tablet by mouth daily.   . Homeopathic Products [facility] RELIEF EX) Apply 1 spray topically daily as needed (muscle cramps).   . hydrOXYzine (ATARAX/VISTARIL) 10 MG tablet Take 1 tablet (10 mg total) by mouth  daily as needed for itching.   Marland Kitchen ipratropium (ATROVENT) 0.06 % nasal spray Place 1-2 sprays each nostril 1-2 times per day (Patient taking differently: Place 2 sprays into both nostrils 2 (two) times daily as needed for rhinitis. )   . irbesartan (AVAPRO) 150 MG tablet TAKE 1 TABLET(150 MG) BY MOUTH DAILY (Patient taking differently: Take 150 mg by mouth daily. )   . Lactase (DAIRY-RELIEF PO) Take 1 capsule by mouth daily as needed (eating dairy).    Marland Kitchen levalbuterol (XOPENEX) 1.25 MG/3ML nebulizer solution USE 1 VIAL VIA NEBULIZER EVERY 6 HOURS AS NEEDED FOR WHEEZING OR SHORTNESS OF BREATH (Patient taking differently: Take 1.25 mg by nebulization every 6 (six) hours as needed for wheezing or shortness of breath. )   . meclizine (ANTIVERT) 12.5 MG tablet Take 1 tablet (12.5 mg total) by mouth 3 (three) times daily as needed for dizziness.   . methimazole (TAPAZOLE) 5 MG tablet TAKE 1 TABLET BY MOUTH IN THE MORNING AND 1/2 TABLET IN THE EVENING WITH MEALS   . Misc Natural Products (GLUCOSAMINE CHONDROITIN TRIPLE) TABS Take 2 tablets by mouth daily.   . montelukast (SINGULAIR) 10 MG tablet Take 1 tablet (10 mg total) by mouth daily.   Marland Kitchen nystatin (MYCOSTATIN) 100000 UNIT/ML suspension SWISH AND SWALLOW 5 ML BY MOUTH TWICE DAILY AFTER ADVAIR   . omalizumab (XOLAIR) 150 MG injection Inject 300 mg into the skin every 28 (twenty-eight) days.   . ondansetron (ZOFRAN-ODT) 4 MG disintegrating tablet Take 4 mg by mouth every 8 (eight) hours as needed for nausea or vomiting.    [name] Delica Lancets 32G MISC Use to check blood sugar up to 2 times a day.   [name] VERIO test strip Use to check blood sugar up to 2 times a day   . oxybutynin (DITROPAN-XL) 5 MG 24 hr tablet Take 5 mg by mouth daily.   . pantoprazole (PROTONIX) 40 MG tablet TAKE 1 TABLET(40 MG) BY MOUTH DAILY (Patient taking differently: Take 40 mg by mouth daily. )   . PATADAY 0.2 % SOLN Place 1 drop into both eyes daily as needed (allergies).     Vladimir Faster Glycol-Propyl Glycol (SYSTANE OP) Place 1 drop into both eyes 2 (two) times daily.   . Potassium 99 MG TABS Take 99 mg by mouth daily.   . Probiotic Product (ALIGN) 4 MG CAPS Take 4 mg by mouth daily.    . Pumpkin Seed-Soy Germ (AZO BLADDER CONTROL/GO-LESS) CAPS Take 1 tablet by mouth 2 (two) times daily.   . Tiotropium Bromide Monohydrate (SPIRIVA RESPIMAT) 1.25 MCG/ACT AERS Inhale 2 puffs into the lungs daily.   Marland Kitchen triamcinolone cream (KENALOG) 0.1 % Apply 1 application topically 2 (two) times  daily. (Patient taking differently: Apply 1 application topically 2 (two) times daily as needed (itching). )   . TRULANCE 3 MG TABS Take 3 mg by mouth daily.    . TURMERIC PO Take 1,000 mg by mouth daily.   . VOLTAREN 1 % GEL Apply 2 g topically 4 (four) times daily. (Patient taking differently: Apply 2 g topically 4 (four) times daily as needed (pain). )   . XARELTO 20 MG TABS tablet TAKE 1 TABLET(20 MG) BY MOUTH DAILY WITH SUPPER    Facility-Administered Encounter Medications as of 03/11/2020  Medication  . omalizumab Arvid Right) injection 300 mg   Allergies  Allergen Reactions  . Latex Hives  . Penicillins Swelling    Has patient had a PCN reaction causing immediate rash, facial/tongue/throat swelling, SOB or lightheadedness with hypotension patient had a PCN reaction causing severe rash involving mucus membranes or skin necrosis: [id number]} Has patient had a PCN reaction that required hospitalization/No Has patient had a PCN reaction occurring within the last 10 years: No If all of the above answers are "NO", then may proceed with Cephalosporin use.     Recardo Evangelist [Pregabalin] Other (See Comments)    Blurred vision.  Marland Kitchen Phenytoin Sodium Extended Swelling  . Vicodin [Hydrocodone-Acetaminophen] Nausea And Vomiting   SDOH Screenings   Alcohol Screen:   . Last Alcohol Screening Score (AUDIT): Not on file  Depression (PHQ2-9): Low Risk   . PHQ-2 Score: 0  Financial Resource  Strain: Low Risk   . Difficulty of Paying Living Expenses: Not hard at all  Food Insecurity:   . Worried About Charity fundraiser in the Last Year: Not on file  . Ran Out of Food in the Last Year: Not on file  Housing:   . Last Housing Risk Score: Not on file  Physical Activity:   . Days of Exercise per Week: Not on file  . Minutes of Exercise per Session: Not on file  Social Connections:   . Frequency of Communication with Friends and Family: Not on file  . Frequency of Social Gatherings with Friends and Family: Not on file  . Attends Religious Services: Not on file  . Active Member of Clubs or Organizations: Not on file  . Attends Archivist Meetings: Not on file  . Marital Status: Not on file  Stress:   . Feeling of Stress : Not on file  Tobacco Use: Low Risk   . Smoking Tobacco Use: Never Smoker  . Smokeless Tobacco Use: Never Used  Transportation Needs:   . Film/video editor (Medical): Not on file  . Lack of Transportation (Non-Medical): Not on file     Current Diagnosis/Assessment:   Financial Resource Strain: Low Risk   . Difficulty of Paying Living Expenses: Not hard at all    Goals    . Increase water intake     Starting 03/28/2018, I will continue to drink at least 6-8 glasses of water daily.     Marland Kitchen Pharmacy Care Plan: Diabetes     CARE PLAN ENTRY  Current Barriers:  . Diabetes: controlled; complicated by chronic medical conditions including high blood pressure Lab Results  Component Value Date   HGBA1C 6.4 04/09/2019 .   Lab Results  Component Value Date   CREATININE 1.00 04/13/2019   CREATININE 0.94 04/09/2019   CREATININE 1.12 03/28/2018   . Current antihyperglycemic regimen: Empagliflozin-linagliptin (Glyxambi) 10 mg-5 mg - 1 tablet daily in the morning  . Denies hypoglycemic symptoms, including dizziness,  lightheadedness, shaking, sweating . Current exercise: occasional use of stationary bike or foot pedaling system . Current blood  glucose readings: Fasting BG: 112-120s  . Cardiovascular risk reduction: o Current hypertensive regimen: irbesartan 150 mg daily o Current hyperlipidemia regimen: atorvastatin 10 mg daily o Current antiplatelet regimen: no pharmacotherapy, on Xarelto  Pharmacist Clinical Goal(s):  Marland Kitchen Over the next 3 months, patient will work with PharmD and primary care provider to address need for cholesterol-lowering therapy to reduce cardiovascular risk - resolved, maintain goal of LDL < 100, HDL > 50, TG < 150   Interventions: . Patient's visit in July was cancelled so no recent lipid panel. Patient's lipid panel not available for review at this time. Scheduled for AWV 04/2020.   Patient Self Care Activities:  . Patient will continue to check blood glucose weekly before breakfast, document, and provide at future appointments . Patient will focus on medication adherence by continuing to use pillbox  . Patient will continue atorvastatin 10 mg daily as prescribed . Patient will contact provider with any episodes of hypoglycemia . Patient will report any questions or concerns to provider  . Patient will fast for cholesterol labs.   Please see past updates related to this goal by clicking on the "Past Updates" button in the selected goal     . Pharmacy Care Plan: General     CARE PLAN ENTRY  Current Barriers:  . Chronic Disease Management support, education, and care coordination needs related to asthma, acid reflux/hoarseness, vaccinations, pain  Pharmacist Clinical Goal(s):  Marland Kitchen Over the next 3 months, patient will work with PharmD and primary care provider to address the following goals: o COPD: Ensure accuracy of medication dosing, upon review of chart, your maintenance inhalers should be taken as follows until next pulmonology appt: - Advair 2 puffs twice daily - Spiriva 2 puffs every morning - Flovent 2 puffs twice daily as needed during asthma flare ups - Continue Nystatin after each use of  Advair o Vaccinations: Recommend 2-dose series of shingles vaccine (Shingrix). o Pain: Limit overuse of Tylenol with codeine o Acid Reflux: Correct timing of medications  Interventions: . Comprehensive medication review performed  Patient Self Care Activities:  For the next 3 months until follow up visit:  . Increase Advair to 2 puffs twice daily per pulmonology note from 11/24/19 . Check with local pharmacy for shingles vaccine . Take Pantoprazole 30 minutes before breakfast for best results.  . Limit Tylenol with codeine to 2 tabs/day on bad days due to lack of benefit/increased side effects at higher doses.  Updated goal documentation    . Pharmacy Care Plan: Hypertension     CARE PLAN ENTRY: Hypertension/Weight Loss  Current Barriers:  . Controlled hypertension, complicated by diabetes . Current antihypertensive regimen:   Irbesartan 150 mg - 1 tablet daily in the morning  Furosemide 40 mg - 1 tablet daily as needed   Potassium 99 mg - 1 tablet daily in the evening . Previous antihypertensives tried: multiple (valsartan, diltiazem, amlodipine) . Last practice recorded BP readings:  BP Readings from Last 3 Encounters:  10/20/19 140/80  10/20/19 106/60  07/21/19 124/82 .  Current home BP readings: none, patient is not checking routinely at home  Pharmacist Clinical Goal(s):  Marland Kitchen Over the next 3 months, patient will work with PharmD and providers on the following goals: o Work towards weight loss goal of less than 200 lbs by increasing exercise and making dietary changes  Interventions: . Discussed exercise routine/goals  Patient Self Care Activities: Over the next 3 months patient will: . Continue to check BP if symptomatic, document, and provide at future appointments . Focus on medication adherence by continuing to use pillbox and updating home medication list . Increase exercise by trying cycling to achieve goal of 10 minutes per day over the next 3 months . Make  one step towards healthier diet by reviewing handout on heart healthy diet over the next 3 months  . Call if abnormal weight loss/weight gain  Please see past updates related to this goal by clicking on the "Past Updates" button in the selected goal       Exercise/Weight loss   Goal set at previous visit: begin exercise in 10 minute intervals  Weight 10/30/19: 216 lbs (down from 250 lbs in the past year)  Weight 12/02/19: 198.9 lbs (home scale)  Goal weight: < 200 lbs  Exercise: unable to walk due to energy, started some cycling/foot pedaling in 5 minute intervals and experienced back pain, plans to try stationary cycling instead  Assessment: Seems patient's scale may be inaccurate as weight in office on 11/24/19 was 219 lbs. Per patient scale this morning, she was down to 199 lbs. Expressed concern about dramatic weight loss. Pt denies change in diet and has only minimally increased exercise. Does report some diarrhea but resolved.  Update 03/15/2020 - Patient has been exercising during therapy but otherwise limited while on picc line antibiotics. Patient reports healthy diet has continued with baked/grilled lean protein. Patient hopes to resume regular exercise since picc line discontinued. Blood sugars remain below goal 112-120 mg/dL fasting.   Plan: Try bicycling for 10 minute intervals 2-3 days per week.  Hyperlipidemia   Lipid Panel     Component Value Date/Time   CHOL 135 04/09/2019 1332   TRIG 93.0 04/09/2019 1332   HDL 45.80 04/09/2019 1332   CHOLHDL 3 04/09/2019 1332   VLDL 18.6 04/09/2019 1332   LDLCALC 71 04/09/2019 1332    The 10-year ASCVD risk score Mikey Bussing DC Jr., et al., 2013) is: 20.3%   Values used to calculate the score:     Age: 56 years     Sex: Female     Is Non-Hispanic African American: Yes     Diabetic: Yes     Tobacco smoker: No     Systolic Blood Pressure: 681 mmHg     Is BP treated: Yes     HDL Cholesterol: 45.8 mg/dL     Total Cholesterol: 135  mg/dL   LDL goal < 100 Patient has failed these meds in past: none  Patient is currently controlled on the following medications:   Atorvastatin 10 mg - 1 tablet daily  We discussed:pt started statin as recommended at previous visit, reports doing well  Update 03/15/2020 - Lipid panel was scheduled for visit in July but was cancelled. Review for updated lipid panel at next CCM visit. Patient sees Dr. Diona Browner in November.   Plan: Continue current medications, repeat lipid panel July 2021 scheduled. Continue weight loss efforts/exercise.   Asthma   Last spirometry score: 01/21 FEV1 77%  Eosinophil count:   Lab Results  Component Value Date/Time   EOSPCT 2.0 01/04/2020 10:59 AM   EOSPCT 1.3 02/01/2011 11:06 AM  %                               Eos (Absolute):  Lab Results  Component Value Date/Time  EOSABS 122 01/04/2020 10:59 AM   EOSABS 0.1 02/01/2011 11:06 AM   Tobacco Status:  Social History   Tobacco Use  Smoking Status Never Smoker  Smokeless Tobacco Never Used   CPAP use: most nights - used less recently due to back pain, recent bought of diarrhea keeping her up at night Patient has failed these meds in past: none reported Patient is currently uncontrolled on the following medications:  Maintenance:  Advair (fluticasone-salmeterol 230-21 mcg/act) - 1 puff twice daily   Spiriva (tiotropium 1.25 mcg/act) - 2 puffs daily   Rescue/As needed:  Flovent (fluticasone 220 mcg) - 2 puffs BID during flare ups PRN   Albuterol by nebulizer - PRN  Allergies:  Montelukast 10 mg - 1 tablet daily at bedtime   Xolair injection 300 mg - [place] monthly   Azelastine nasal spray - BID   Ipratropium nasal spray - BID as needed  Mucinex DM - 1 daily PRN  Using maintenance inhaler regularly? Yes Frequency of rescue inhaler use: Flovent - less than once a month; using nebulizer 2-3 times a week - currently having asthma flare and on prednisone x 10 days   We discussed:  previously reduced Advair from 2 puffs BID to 1 puff BID due to thrush/hoarseness and started Nystatin; At last visit pt was taking 1 puff Spiriva but has corrected to 2 puffs per our discussion. She reports using nebulizer less frequently until recent flare started yesterday. Per pulmonology patient is back on full dose Advair, however, pt still taking half usual dose. Will need to clarify dosing. Pt did run out of Advair and had to replace with Flovent x 1 day in the last month.  Plan: Continue current medications; Resume Advair - 2 puffs BID per recent pulmonology visit.   Depression/Insomnia    Sleep: reports some improvement in the past month, still difficulty falling asleep  Patient has failed these meds in past: duloxetine and doxepin, denies recall of any side effects  Patient is currently controlled on the following medications:   No pharmacotherapy  We discussed: prefers not to take medication for sleep/mood at this time; sleep has improved with eye mask   Plan: Continue healthy sleep habits   OA/Chronic Pain   Pain: back pain - stable  Patient has failed these meds in past: none reported Patient is currently controlled on the following medications:   Acetaminophen/codeine 300-30 mg - 1 tablet q12h PRN  Cyclobenzaprine 10 mg - 1 tablet qhs   OTC Salonpas cream and spray - as needed (less than once daily, but uses on regular basis)  Voltaren gel 1% - apply up to QID PRN  Tylenol Arthritis - PRN breakthrough pain  Supplements:  Glucosamine 1500 mg - 2 tablets daily  Boswellia - 1 tablet daily  We discussed: reports taking Tylenol with codeine PRN severe pain up to QID, not every day; some weeks does not need at all; continues Tylenol Arthritis for breakthrough pain, cyclobenzaprine qhs, and Voltaren gel PRN  Recommended trying to limit codeine to 2 tabs/day due to lack of benefit/increased side effects at higher doses.   Plan: Continue current medications; Limit  tylenol with codeine to 2 tabs/day due to lack of benefit/increased side effects at higher doses.   GERD   Patient has failed these meds in past: none  Patient is currently controlled on the following medications:   Famotidine 40 mg - 1 tablet once daily at bedtime  Pantoprazole 40 mg - 1 tablet daily 30 minutes  before breakfast   Daily Women's Probiotic - 1 capsule daily    We discussed: denies symptoms this week, still using alka seltzer for breakthrough symptoms; asked about timing of medications and patient reports forgetting to take pantoprazole 30 minute before BF but will right down on her med list. Taking Pepcid at bedtime as recommended.   Plan: Continue current medications. Take Pantoprazole 30 minutes before breakfast for best results.   Medication Management  Pharmacy/Benefits: Medicare/BCBS FEP, DOD/[facility]  Adherence: 7 day pillbox   Affordability: denies concerns  CCM Follow Up: Friday 06/24/2020 @ 11 am - will review updated labs  Sherre Poot, PharmD, [facility] Clinical Pharmacist [facility] [phone] (office) [phone] (mobile)

## 2020-03-08 NOTE — Telephone Encounter (Signed)
Patient left voicemail on triage line requesting call back from MD. States she had picc line removed two weeks ago, and has some concerns she would like discuss with MD.  Attempted to call patient back regarding message. Call ended with dial tone. Not able to reach patient at this time. Will call back later today. [place]

## 2020-03-08 NOTE — Telephone Encounter (Signed)
Patient requests to be called at ph# [phone] to discuss taking something for her weight (patient wants to make sure that she can take a medication that will not interfere with her thyroid medication).

## 2020-03-09 ENCOUNTER — Ambulatory Visit: Payer: Medicare Other | Admitting: Adult Health

## 2020-03-09 NOTE — Telephone Encounter (Signed)
LM for patient to call back with more information regarding what she is wanting to take for weight loss.

## 2020-03-09 NOTE — Telephone Encounter (Signed)
Patient called back, stating she'd gotten a missed call from [facility], she said if she is able to call her back, she will be home for the rest of the day.

## 2020-03-09 NOTE — Telephone Encounter (Signed)
Patient calling back to speak to Melissa-I asked her several times for a more detailed message about why she was calling and she stated just have her call me back-FYI

## 2020-03-10 ENCOUNTER — Other Ambulatory Visit: Payer: Self-pay | Admitting: Allergy and Immunology

## 2020-03-10 NOTE — Telephone Encounter (Signed)
Got in touch with patient, she would like to know if it is ok for her to take 5 teaspoons a day of balsamic vinegar to help lose fat on her tummy? Will in mess with her thyroid or thyroid medication?

## 2020-03-10 NOTE — Telephone Encounter (Signed)
This is not an FDA regulated weight loss supplement so I cannot recommend it.  However, if she does try it, I do not think this will interact with her thyroid medication.

## 2020-03-10 NOTE — Telephone Encounter (Signed)
Called patient to follow up on how she if feeling. States she wanted to provide MD with two week follow up on how she if feeling since picc removal. State overall she is feeling good; has some pain in hip/buttocks. Rates this pain as a 6, but gets worse while she is driving. Is taking Tylenol to help with pain. Has only been going on for 3 days.  Advised patient to reach out to PCP if pain continues. Will follow up with office as needed per last note.  [place]

## 2020-03-11 ENCOUNTER — Other Ambulatory Visit: Payer: Self-pay

## 2020-03-11 ENCOUNTER — Ambulatory Visit: Payer: Medicare Other

## 2020-03-11 DIAGNOSIS — E7800 Pure hypercholesterolemia, unspecified: Secondary | ICD-10-CM

## 2020-03-11 DIAGNOSIS — E1140 Type 2 diabetes mellitus with diabetic neuropathy, unspecified: Secondary | ICD-10-CM

## 2020-03-11 NOTE — Telephone Encounter (Signed)
Notified via VM

## 2020-03-14 DIAGNOSIS — J4550 Severe persistent asthma, uncomplicated: Secondary | ICD-10-CM

## 2020-03-15 ENCOUNTER — Other Ambulatory Visit: Payer: Self-pay

## 2020-03-15 ENCOUNTER — Ambulatory Visit (INDEPENDENT_AMBULATORY_CARE_PROVIDER_SITE_OTHER): Payer: Medicare Other

## 2020-03-15 ENCOUNTER — Telehealth: Payer: Self-pay | Admitting: Allergy and Immunology

## 2020-03-15 DIAGNOSIS — J4550 Severe persistent asthma, uncomplicated: Secondary | ICD-10-CM

## 2020-03-15 NOTE — Patient Instructions (Signed)
Visit Information  Goals Addressed            This Visit's Progress   . Pharmacy Care Plan: Diabetes       CARE PLAN ENTRY  Current Barriers:  . Diabetes: controlled; complicated by chronic medical conditions including high blood pressure Lab Results  Component Value Date   HGBA1C 6.4 04/09/2019 .   Lab Results  Component Value Date   CREATININE 1.00 04/13/2019   CREATININE 0.94 04/09/2019   CREATININE 1.12 03/28/2018   . Current antihyperglycemic regimen: Empagliflozin-linagliptin (Glyxambi) 10 mg-5 mg - 1 tablet daily in the morning  . Denies hypoglycemic symptoms, including dizziness, lightheadedness, shaking, sweating . Current exercise: occasional use of stationary bike or foot pedaling system . Current blood glucose readings: Fasting BG: 112-120s  . Cardiovascular risk reduction: o Current hypertensive regimen: irbesartan 150 mg daily o Current hyperlipidemia regimen: atorvastatin 10 mg daily o Current antiplatelet regimen: no pharmacotherapy, on Xarelto  Pharmacist Clinical Goal(s):  Marland Kitchen Over the next 3 months, patient will work with PharmD and primary care provider to address need for cholesterol-lowering therapy to reduce cardiovascular risk - resolved, maintain goal of LDL < 100, HDL > 50, TG < 150   Interventions: . Patient's visit in July was cancelled so no recent lipid panel. Patient's lipid panel not available for review at this time. Scheduled for AWV 04/2020.   Patient Self Care Activities:  . Patient will continue to check blood glucose weekly before breakfast, document, and provide at future appointments . Patient will focus on medication adherence by continuing to use pillbox  . Patient will continue atorvastatin 10 mg daily as prescribed . Patient will contact provider with any episodes of hypoglycemia . Patient will report any questions or concerns to provider  . Patient will fast for cholesterol labs.   Please see past updates related to this goal by  clicking on the "Past Updates" button in the selected goal        The patient verbalized understanding of instructions provided today and declined a print copy of patient instruction materials.   Telephone follow up appointment with pharmacy team member scheduled for:06/2020  Sherre Poot, PharmD, [facility] Clinical Pharmacist [place] [phone] (office) [phone] (mobile)

## 2020-03-15 NOTE — Telephone Encounter (Signed)
Patient came by desk and would like for you to call her about a test she needs and and some medication. [phone].

## 2020-03-16 ENCOUNTER — Ambulatory Visit: Payer: Self-pay

## 2020-03-17 ENCOUNTER — Telehealth: Payer: Self-pay | Admitting: Family Medicine

## 2020-03-17 ENCOUNTER — Telehealth: Payer: Self-pay

## 2020-03-17 NOTE — Telephone Encounter (Signed)
LVM for pt to rtm my call to r/ appt on 04/14/20 with [facility]

## 2020-03-17 NOTE — Telephone Encounter (Signed)
Patient called in stating she is having pain where she had the biopsy done. She called location where she had the test and they stated if she is having pain to call PCP and refer her back to them. Please advise.

## 2020-03-17 NOTE — Telephone Encounter (Signed)
Please triage and make appt if necessary.

## 2020-03-17 NOTE — Telephone Encounter (Signed)
Noted  

## 2020-03-17 NOTE — Telephone Encounter (Signed)
I spoke with pt; pt had biopsy of spine by infectious disease center on 01/12/20; pt was advised by nurse at infectious disease office to call PCP; pt said the ortho dr referred pt to infectious disease dr and pt thinks she should call ortho since they did referral to see if they can help pt with her pain. Pt will call [facility] back for appt if no assistance from ortho when pt calls them. FYI to Dr Diona Browner.

## 2020-03-22 DIAGNOSIS — M5136 Other intervertebral disc degeneration, lumbar region: Secondary | ICD-10-CM | POA: Diagnosis not present

## 2020-04-04 DIAGNOSIS — H04123 Dry eye syndrome of bilateral lacrimal glands: Secondary | ICD-10-CM | POA: Diagnosis not present

## 2020-04-04 DIAGNOSIS — H43812 Vitreous degeneration, left eye: Secondary | ICD-10-CM | POA: Diagnosis not present

## 2020-04-05 ENCOUNTER — Ambulatory Visit (INDEPENDENT_AMBULATORY_CARE_PROVIDER_SITE_OTHER): Payer: Medicare Other | Admitting: Podiatry

## 2020-04-05 ENCOUNTER — Encounter: Payer: Self-pay | Admitting: Podiatry

## 2020-04-05 ENCOUNTER — Other Ambulatory Visit: Payer: Self-pay

## 2020-04-05 DIAGNOSIS — B351 Tinea unguium: Secondary | ICD-10-CM

## 2020-04-05 DIAGNOSIS — Q828 Other specified congenital malformations of skin: Secondary | ICD-10-CM | POA: Diagnosis not present

## 2020-04-05 DIAGNOSIS — M79676 Pain in unspecified toe(s): Secondary | ICD-10-CM

## 2020-04-05 DIAGNOSIS — E1142 Type 2 diabetes mellitus with diabetic polyneuropathy: Secondary | ICD-10-CM

## 2020-04-05 NOTE — Progress Notes (Signed)
She presents today chief complaint of painful calluses and toenails bilaterally.  Objective: Toenails are thick yellow dystrophic-like mycotic multiple reactive hyperkeratotic lesions porokeratotic lesion plantar aspect of the bilateral foot.  No open lesions or wounds.  Assessment: Pain in limb secondary to onychomycosis and porokeratosis.  Plan: Debridement of porokeratotic lesion debridement of toenails 1 through 5 bilaterally.

## 2020-04-08 DIAGNOSIS — R490 Dysphonia: Secondary | ICD-10-CM | POA: Diagnosis not present

## 2020-04-08 DIAGNOSIS — J383 Other diseases of vocal cords: Secondary | ICD-10-CM | POA: Diagnosis not present

## 2020-04-08 DIAGNOSIS — R682 Dry mouth, unspecified: Secondary | ICD-10-CM | POA: Diagnosis not present

## 2020-04-09 ENCOUNTER — Other Ambulatory Visit: Payer: Self-pay | Admitting: Allergy and Immunology

## 2020-04-11 ENCOUNTER — Other Ambulatory Visit: Payer: Self-pay | Admitting: Internal Medicine

## 2020-04-11 ENCOUNTER — Other Ambulatory Visit: Payer: Self-pay | Admitting: Allergy and Immunology

## 2020-04-11 DIAGNOSIS — J4550 Severe persistent asthma, uncomplicated: Secondary | ICD-10-CM | POA: Diagnosis not present

## 2020-04-12 ENCOUNTER — Ambulatory Visit (INDEPENDENT_AMBULATORY_CARE_PROVIDER_SITE_OTHER): Payer: Medicare Other | Admitting: *Deleted

## 2020-04-12 ENCOUNTER — Ambulatory Visit: Payer: Self-pay

## 2020-04-12 ENCOUNTER — Other Ambulatory Visit: Payer: Self-pay

## 2020-04-12 DIAGNOSIS — J4550 Severe persistent asthma, uncomplicated: Secondary | ICD-10-CM

## 2020-04-13 ENCOUNTER — Encounter: Payer: Self-pay | Admitting: Adult Health

## 2020-04-13 ENCOUNTER — Ambulatory Visit (INDEPENDENT_AMBULATORY_CARE_PROVIDER_SITE_OTHER): Payer: Medicare Other | Admitting: Adult Health

## 2020-04-13 VITALS — BP 109/67 | HR 70 | Ht 64.5 in | Wt 212.8 lb

## 2020-04-13 DIAGNOSIS — G4733 Obstructive sleep apnea (adult) (pediatric): Secondary | ICD-10-CM

## 2020-04-13 DIAGNOSIS — Z9989 Dependence on other enabling machines and devices: Secondary | ICD-10-CM | POA: Diagnosis not present

## 2020-04-13 NOTE — Progress Notes (Signed)
PATIENT: [name] DOB: 06-May-1946  REASON FOR VISIT: follow up HISTORY FROM: patient  HISTORY OF PRESENT ILLNESS: Today 04/13/20:  [name] is a 74 year old female with a history of obstructive sleep apnea on CPAP.  Her download indicates that she used the machine 22 out of 30 days for compliance of 73%.  She used her machine greater than 4 hours 17 days for compliance of 57%.  On average she uses her machine 5 hours and 6 minutes.  Her residual AHI is 1.3 on 5 to 10 cm of water with EPR of 3.  Her leak in the 95th percentile is 31.6 L/min. She returns today for follow-up.  HISTORY 03/05/19:  [name] is a 74 year old female with a history of obstructive sleep apnea on CPAP.  Her download indicates that she used the machine 22 out of 30 days for compliance of 73%.  She used her machine greater than 4 hours 21 days for compliance of 70%.  On average she uses her machine 6 hours and 5 minutes.  Her residual AHI is 1.6 on 5 to 10 cm of water with EPR of 3.  Her leak in the 95th percentile is 29.7 L/min.  REVIEW OF SYSTEMS: Out of a complete 14 system review of symptoms, the patient complains only of the following symptoms, and all other reviewed systems are negative.  ESS 10  ALLERGIES: Allergies  Allergen Reactions  . Latex Hives  . Penicillins Swelling    Has patient had a PCN reaction causing immediate rash, facial/tongue/throat swelling, SOB or lightheadedness with hypotension patient had a PCN reaction causing severe rash involving mucus membranes or skin necrosis: [id number] Has patient had a PCN reaction that required hospitalization/No Has patient had a PCN reaction occurring within the last 10 years: No If all of the above answers are "NO", then may proceed with Cephalosporin use.     [name] [Pregabalin] Other (See Comments)    Blurred vision.  [name] Phenytoin Sodium Extended Swelling  . Vicodin [Hydrocodone-Acetaminophen] Nausea And Vomiting    HOME  MEDICATIONS: Outpatient Medications Prior to Visit  Medication Sig Dispense Refill  . acetaminophen (TYLENOL) 650 MG CR tablet Take 1,300 mg by mouth daily as needed for pain.    [name] ADVAIR HFA 230-21 MCG/ACT inhaler INHALE 2 PUFFS BY MOUTH TWICE DAILY TO PREVENT COUGH OR WHEEZING, RINSE MOUTH AFTER USE 12 g 4  . albuterol (VENTOLIN HFA) 108 (90 Base) MCG/ACT inhaler INHALE 2 PUFFS BY MOUTH INTO THE LUNGS EVERY 6 HOURS AS NEEDED FOR WHEEZING OR SHORTNESS OF BREATH 54 g 0  . antiseptic oral rinse (BIOTENE) LIQD 15 mLs by Mouth Rinse route in the morning and at bedtime.    [name] Apoaequorin (PREVAGEN) 10 MG CAPS Take 10 mg by mouth daily.    . Artificial Saliva (BIOTENE DRY MOUTH) LOZG Use as directed 1 lozenge in the mouth or throat daily as needed (dry eyes).    [name] atorvastatin (LIPITOR) 10 MG tablet Take 1 tablet (10 mg total) by mouth daily. 90 tablet 3  . azelastine (ASTELIN) 0.1 % nasal spray USE 1-2 SPRAYS IN EACH NOSTRIL TWICE DAILY AS NEEDED (Patient taking differently: Place 2 sprays into both nostrils 2 (two) times daily as needed for allergies. ) 30 mL 5  . Capsaicin-Menthol (SALONPAS GEL EX) Apply 1 application topically daily as needed (pain).    . Coenzyme Q10 (COQ-10) 100 MG CAPS Take 100 mg by mouth daily.    . cyclobenzaprine (FLEXERIL)  10 MG tablet Take 1 tablet (10 mg total) by mouth at bedtime as needed for muscle spasms. 30 tablet 1  . dextromethorphan-guaiFENesin (MUCINEX DM) 30-600 MG 12hr tablet Take 1 tablet by mouth 2 (two) times daily as needed for cough.    . Empagliflozin-linaGLIPtin (GLYXAMBI) 10-5 MG TABS Take 1 tablet by mouth daily. 30 tablet 11  . EPINEPHrine 0.3 mg/0.3 mL IJ SOAJ injection USE AS DIRECTED FOR LIFE THREATENING ALLERGIC REACTIONS (Patient taking differently: Inject 0.3 mg into the muscle as needed for anaphylaxis. ) 2 each 2  . estradiol (ESTRACE) 0.1 MG/GM vaginal cream 0.5 g of cream intravaginally administered daily for 2 weeks, then reduce to twice  weekly 42.5 g 12  . famotidine (PEPCID) 40 MG tablet TAKE 1 TABLET(40 MG) BY MOUTH DAILY (Patient taking differently: Take 40 mg by mouth daily. ) 90 tablet 1  . fluticasone (FLOVENT HFA) 220 MCG/ACT inhaler INHALE 2 PUFFS BY MOUTH TWICE DAILY DURING FLARE UP AS NEEDED. RINSE, GARGLE AND SPIT AFTER USE 12 g 1  . furosemide (LASIX) 40 MG tablet TAKE 1 TABLET(40 MG) BY MOUTH DAILY (Patient taking differently: Take 40 mg by mouth daily as needed for edema. ) 30 tablet 3  . gabapentin (NEURONTIN) 600 MG tablet TAKE 1 TABLET BY MOUTH DAILY AT BREAKFAST, 1 TABLET AT LUNCH, AND 2 TABLETS AT BEDTIME (Patient taking differently: Take 600-1,200 mg by mouth See admin instructions. Take 600 mg in the morning, 600 mg with lunch, and 1200 mg at night) 360 tablet 1  . Garlic (GARLIQUE PO) Take 1 tablet by mouth daily.    . Homeopathic Products [facility] RELIEF EX) Apply 1 spray topically daily as needed (muscle cramps).    . hydrOXYzine (ATARAX/VISTARIL) 10 MG tablet Take 1 tablet (10 mg total) by mouth daily as needed for itching. 15 tablet 0  . ipratropium (ATROVENT) 0.06 % nasal spray Place 1-2 sprays each nostril 1-2 times per day (Patient taking differently: Place 2 sprays into both nostrils 2 (two) times daily as needed for rhinitis. ) 15 mL 5  . irbesartan (AVAPRO) 150 MG tablet TAKE 1 TABLET(150 MG) BY MOUTH DAILY (Patient taking differently: Take 150 mg by mouth daily. ) 30 tablet 9  . Lactase (DAIRY-RELIEF PO) Take 1 capsule by mouth daily as needed (eating dairy).     [name] levalbuterol (XOPENEX) 1.25 MG/3ML nebulizer solution USE 1 VIAL VIA NEBULIZER EVERY 6 HOURS AS NEEDED FOR WHEEZING OR SHORTNESS OF BREATH (Patient taking differently: Take 1.25 mg by nebulization every 6 (six) hours as needed for wheezing or shortness of breath. ) 9 mL 1  . meclizine (ANTIVERT) 12.5 MG tablet Take 1 tablet (12.5 mg total) by mouth 3 (three) times daily as needed for dizziness. 15 tablet 0  . methimazole (TAPAZOLE) 5 MG  tablet TAKE 1 TABLET BY MOUTH IN THE MORNING AND 1/2 TABLET IN THE EVENING WITH MEALS 225 tablet 0  . Misc Natural Products (GLUCOSAMINE CHONDROITIN TRIPLE) TABS Take 2 tablets by mouth daily.    . montelukast (SINGULAIR) 10 MG tablet Take 1 tablet (10 mg total) by mouth daily. 90 tablet 1  . nystatin (MYCOSTATIN) 100000 UNIT/ML suspension SWISH AND SWALLOW 5 ML BY MOUTH TWICE DAILY AFTER ADVAIR 60 mL 5  . omalizumab (XOLAIR) 150 MG injection Inject 300 mg into the skin every 28 (twenty-eight) days.    . ondansetron (ZOFRAN-ODT) 4 MG disintegrating tablet Take 4 mg by mouth every 8 (eight) hours as needed for nausea or vomiting.     [name]  OneTouch Delica Lancets 82N MISC Use to check blood sugar up to 2 times a day. 100 each 5  . ONETOUCH VERIO test strip Use to check blood sugar up to 2 times a day 100 each 5  . oxybutynin (DITROPAN-XL) 5 MG 24 hr tablet Take 5 mg by mouth daily.    . pantoprazole (PROTONIX) 40 MG tablet TAKE 1 TABLET(40 MG) BY MOUTH DAILY (Patient taking differently: Take 40 mg by mouth daily. ) 90 tablet 1  . PATADAY 0.2 % SOLN Place 1 drop into both eyes daily as needed (allergies).   4  . Polyethyl Glycol-Propyl Glycol (SYSTANE OP) Place 1 drop into both eyes 2 (two) times daily.    . Potassium 99 MG TABS Take 99 mg by mouth daily.    . Probiotic Product (ALIGN) 4 MG CAPS Take 4 mg by mouth daily.     . Pumpkin Seed-Soy Germ (AZO BLADDER CONTROL/GO-LESS) CAPS Take 1 tablet by mouth 2 (two) times daily.    . Tiotropium Bromide Monohydrate (SPIRIVA RESPIMAT) 1.25 MCG/ACT AERS Inhale 2 puffs into the lungs daily. 4 g 5  . triamcinolone cream (KENALOG) 0.1 % Apply 1 application topically 2 (two) times daily. (Patient taking differently: Apply 1 application topically 2 (two) times daily as needed (itching). ) 30 g 0  . TRULANCE 3 MG TABS Take 3 mg by mouth daily.     . TURMERIC PO Take 1,000 mg by mouth daily.    . VOLTAREN 1 % GEL Apply 2 g topically 4 (four) times daily. (Patient  taking differently: Apply 2 g topically 4 (four) times daily as needed (pain). ) 100 g 2  . XARELTO 20 MG TABS tablet TAKE 1 TABLET(20 MG) BY MOUTH DAILY WITH SUPPER 90 tablet 1   Facility-Administered Medications Prior to Visit  Medication Dose Route Frequency Provider Last Rate Last Admin  . omalizumab Arvid Right) injection 300 mg  300 mg Subcutaneous Q28 days [name], MD   300 mg at 04/12/20 1046    PAST MEDICAL HISTORY: Past Medical History:  Diagnosis Date  . Allergic rhinitis   . Allergy   . Arthritis   . Asthma   . Chronic headache   . Diabetes mellitus   . DVT (deep venous thrombosis) ([place])   . Dyspnea   . Heart murmur   . Hypertension   . Penicillin allergy 01/19/2020  . Pulmonary embolism ([place])   . Pulmonary hypertension ([place])   . Seizures ([place])     PAST SURGICAL HISTORY: Past Surgical History:  Procedure Laterality Date  . ABDOMINAL HYSTERECTOMY     partial, has ovaries  . CARDIAC CATHETERIZATION  12/28/2010   Mod. pulmonary hypertension, normal coronary arteries  . DOPPLER ECHOCARDIOGRAPHY  10/08/2011   EF=>55%,mild asymmetric LVH, mod. TR, mod. PH, mild to mod LA dilatation  . IR LUMBAR DISC ASPIRATION W/IMG GUIDE  01/12/2020  . KNEE ARTHROSCOPY Left   . KNEE SURGERY    . Nuclear Stress Test  05/20/2006   No ischemia  . PARTIAL HYSTERECTOMY    . PLANTAR FASCIA SURGERY    . TONSILLECTOMY      FAMILY HISTORY: Family History  Problem Relation Age of Onset  . Hypertension Mother   . Clotting disorder Mother   . Breast cancer Mother   . Arthritis Mother   . Stroke Mother   . Diabetes Mother   . Cancer Brother   . Alcohol abuse Father   . Arthritis Sister   . Diabetes  Sister   . Multiple sclerosis Sister   . Allergies Other        grandson  . Allergic rhinitis Neg Hx   . Angioedema Neg Hx   . Asthma Neg Hx   . Atopy Neg Hx   . Eczema Neg Hx   . Immunodeficiency Neg Hx   . Urticaria Neg Hx     SOCIAL HISTORY: Social History   Socioeconomic  History  . Marital status: Widowed    Spouse name: Not on file  . Number of children: Y  . Years of education: Not on file  . Highest education level: Not on file  Occupational History  . Occupation: retired Geologist, engineering.   Tobacco Use  . Smoking status: Never Smoker  . Smokeless tobacco: Never Used  Vaping Use  . Vaping Use: Never used  Substance and Sexual Activity  . Alcohol use: Not Currently    Alcohol/week: 0.0 standard drinks    Comment: occ glass on wine  . Drug use: No  . Sexual activity: Never  Other Topics Concern  . Not on file  Social History Narrative   Widow    limited exercise.   Social Determinants of Health   Financial Resource Strain: Low Risk   . Difficulty of Paying Living Expenses: Not hard at all  Food Insecurity:   . Worried About Charity fundraiser in the Last Year: Not on file  . Ran Out of Food in the Last Year: Not on file  Transportation Needs:   . Lack of Transportation (Medical): Not on file  . Lack of Transportation (Non-Medical): Not on file  Physical Activity:   . Days of Exercise per Week: Not on file  . Minutes of Exercise per Session: Not on file  Stress:   . Feeling of Stress : Not on file  Social Connections:   . Frequency of Communication with Friends and Family: Not on file  . Frequency of Social Gatherings with Friends and Family: Not on file  . Attends Religious Services: Not on file  . Active Member of Clubs or Organizations: Not on file  . Attends Archivist Meetings: Not on file  . Marital Status: Not on file  Intimate Partner Violence:   . Fear of Current or Ex-Partner: Not on file  . Emotionally Abused: Not on file  . Physically Abused: Not on file  . Sexually Abused: Not on file      PHYSICAL EXAM  Vitals:   04/13/20 1302  BP: 109/67  Pulse: 70  Weight: 212 lb 12.8 oz (96.5 kg)  Height: 5' 4.5" (1.638 m)   Body mass index is 35.96 kg/m.  Generalized: Well developed, in no acute distress    Chest: Lungs clear to auscultation bilaterally  Neurological examination  Mentation: Alert oriented to time, place, history taking. Follows all commands speech and language fluent Cranial nerve II-XII: Extraocular movements were full, visual field were full on confrontational test Head turning and shoulder shrug  were normal and symmetric. Motor: The motor testing reveals 5 over 5 strength of all 4 extremities. Good symmetric motor tone is noted throughout.  Sensory: Sensory testing is intact to soft touch on all 4 extremities. No evidence of extinction is noted.  Gait and station: Gait is normal.    DIAGNOSTIC DATA (LABS, IMAGING, TESTING) - I reviewed patient records, labs, notes, testing and imaging myself where available.  Lab Results  Component Value Date   WBC 6.1 01/04/2020   HGB  12.2 01/04/2020   HCT 38.3 01/04/2020   MCV 75.2 (L) 01/04/2020   PLT 344 01/04/2020      Component Value Date/Time   NA 143 01/04/2020 1059   K 4.7 01/04/2020 1059   CL 107 01/04/2020 1059   CO2 26 01/04/2020 1059   GLUCOSE 125 (H) 01/04/2020 1059   BUN 8 01/04/2020 1059   CREATININE 1.04 (H) 01/04/2020 1059   CALCIUM 9.8 01/04/2020 1059   PROT 6.6 01/04/2020 1059   ALBUMIN 4.1 04/09/2019 1332   AST 17 01/04/2020 1059   ALT 10 01/04/2020 1059   ALKPHOS 103 04/09/2019 1332   BILITOT 0.5 01/04/2020 1059   GFRNONAA 53 (L) 01/04/2020 1059   GFRAA 62 01/04/2020 1059   Lab Results  Component Value Date   CHOL 135 04/09/2019   HDL 45.80 04/09/2019   LDLCALC 71 04/09/2019   TRIG 93.0 04/09/2019   CHOLHDL 3 04/09/2019   Lab Results  Component Value Date   HGBA1C 6.4 04/09/2019   Lab Results  Component Value Date   GQQPYPPJ09 326 08/29/2018   Lab Results  Component Value Date   TSH 1.25 11/03/2019      ASSESSMENT AND PLAN 74 y.o. year old female  has a past medical history of Allergic rhinitis, Allergy, Arthritis, Asthma, Chronic headache, Diabetes mellitus, DVT (deep venous  thrombosis) ([place]), Dyspnea, Heart murmur, Hypertension, Penicillin allergy (01/19/2020), Pulmonary embolism ([place]), Pulmonary hypertension ([place]), and Seizures ([place]). here with:  1. OSA on CPAP  - CPAP compliance excellent - Good treatment of AHI  - Encourage patient to use CPAP nightly and > 4 hours each night - F/U in 1 year or sooner if needed   I spent 25 minutes of face-to-face and non-face-to-face time with patient.  This included previsit chart review, lab review, study review, order entry, electronic health record documentation, patient education.  Ward Givens, MSN, NP-C 04/13/2020, 1:02 PM [facility] [address] [place], [place] [address] [phone]

## 2020-04-14 ENCOUNTER — Ambulatory Visit: Payer: Medicare Other

## 2020-04-19 ENCOUNTER — Telehealth: Payer: Self-pay

## 2020-04-19 ENCOUNTER — Encounter: Payer: Self-pay | Admitting: Family Medicine

## 2020-04-19 ENCOUNTER — Ambulatory Visit (INDEPENDENT_AMBULATORY_CARE_PROVIDER_SITE_OTHER): Payer: Medicare Other | Admitting: Family Medicine

## 2020-04-19 ENCOUNTER — Other Ambulatory Visit: Payer: Self-pay

## 2020-04-19 VITALS — BP 110/60 | HR 88 | Temp 98.3°F | Ht 63.5 in | Wt 211.0 lb

## 2020-04-19 DIAGNOSIS — I272 Pulmonary hypertension, unspecified: Secondary | ICD-10-CM

## 2020-04-19 DIAGNOSIS — E1140 Type 2 diabetes mellitus with diabetic neuropathy, unspecified: Secondary | ICD-10-CM

## 2020-04-19 DIAGNOSIS — F325 Major depressive disorder, single episode, in full remission: Secondary | ICD-10-CM | POA: Diagnosis not present

## 2020-04-19 DIAGNOSIS — E1340 Other specified diabetes mellitus with diabetic neuropathy, unspecified: Secondary | ICD-10-CM | POA: Diagnosis not present

## 2020-04-19 DIAGNOSIS — E785 Hyperlipidemia, unspecified: Secondary | ICD-10-CM | POA: Diagnosis not present

## 2020-04-19 DIAGNOSIS — E1169 Type 2 diabetes mellitus with other specified complication: Secondary | ICD-10-CM | POA: Diagnosis not present

## 2020-04-19 DIAGNOSIS — Z0000 Encounter for general adult medical examination without abnormal findings: Secondary | ICD-10-CM | POA: Diagnosis not present

## 2020-04-19 DIAGNOSIS — I480 Paroxysmal atrial fibrillation: Secondary | ICD-10-CM

## 2020-04-19 DIAGNOSIS — I152 Hypertension secondary to endocrine disorders: Secondary | ICD-10-CM | POA: Diagnosis not present

## 2020-04-19 DIAGNOSIS — E1159 Type 2 diabetes mellitus with other circulatory complications: Secondary | ICD-10-CM

## 2020-04-19 LAB — HM DIABETES FOOT EXAM

## 2020-04-19 NOTE — Assessment & Plan Note (Signed)
- 

## 2020-04-19 NOTE — Assessment & Plan Note (Signed)
Followed by cardiology 

## 2020-04-19 NOTE — Telephone Encounter (Signed)
The flyer was for preventing diabetes. She can call to find out more but I think she really needs a referral for nutrition consult given she already has diabetes  [phone]

## 2020-04-19 NOTE — Telephone Encounter (Signed)
Pt said she meant to ask about the flyer on the wall regarding nutrient and diabetes class with [facility]. She wants to know how she can get started? Do she need the number that was on the flyer or does Dr. Diona Browner recommend her to go. She would like a call back.

## 2020-04-19 NOTE — Assessment & Plan Note (Signed)
Followed by cardiology. On Xarelto anticoagulation. Rate controlled  In sinus in office today.

## 2020-04-19 NOTE — Assessment & Plan Note (Addendum)
Due fo re-eval. on statin.

## 2020-04-19 NOTE — Progress Notes (Signed)
Chief Complaint  Patient presents with  . Medicare Wellness    History of Present Illness: HPI  The patient presents for annual medicare wellness, complete physical and review of chronic health problems. He/She also has the following acute concerns today:  She has noted 2-3 bumps in vaginal area in last 3-4 weeks. Not painful.  No bleeding.   Vaginal itchng and irrtaiton.. better with estrogen topical and change pad.  Has noted some hoarse voice mucus in throat... going to ENT. Has follow up in 3 months.  I have personally reviewed the Medicare Annual Wellness questionnaire and have noted 1. The patient's medical and social history 2. Their use of alcohol, tobacco or illicit drugs 3. Their current medications and supplements 4. The patient's functional ability including ADL's, fall risks, home safety risks and hearing or visual             impairment. 5. Diet and physical activities 6. Evidence for depression or mood disorders 7.         Updated provider list Cognitive evaluation was performed and recorded on pt medicare questionnaire form. The patients weight, height, BMI and visual acuity have been recorded in the chart  I have made referrals, counseling and provided education to the patient based review of the above and I have provided the pt with a written personalized care plan for preventive services.    Documentation of this information was scanned into the electronic record under the media tab.    Advance directives and end of life planning reviewed in detail with patient and documented in EMR. Patient given handout on advance care directives if needed. HCPOA and living will updated if needed.  Fall Risk  04/19/2020 02/23/2020 01/04/2020 04/10/2019 03/28/2018  Falls in the past year? 0 0 0 0 No  Number falls in past yr: - - - - -  Injury with Fall? - - - - -  Risk Factor Category  - - - - -  Comment - - - - -  Risk for fall due to : - No Fall Risks - - -  Follow up -  Falls evaluation completed - - -      Office Visit from 01/04/[facility] in [facility] for Infectious Disease  PHQ-2 Total Score 0       Hearing Screening   Method: Audiometry   125Hz  250Hz  500Hz  1000Hz  2000Hz  3000Hz  4000Hz  6000Hz  8000Hz   Right ear:   20 20 20  25     Left ear:   40 20 20  0    Vision Screening Comments: Wears Glasses-Eye Exam at [facility] 03/2020  Patient Care Team: [name], MD as PCP - General (Family Medicine) Croitoru, Dani Gobble, MD as PCP - Cardiology (Cardiology) Hortencia Pilar, MD as Consulting Physician (Ophthalmology) Neldon Mc, Donnamarie Poag, MD as Consulting Physician (Allergy and Immunology) Anda Kraft, MD as Consulting Physician (Endocrinology) Inocencio Homes, DPM as Consulting Physician (Podiatry) Croitoru, Dani Gobble, MD as Consulting Physician (Cardiology) Druscilla Brownie, MD as Consulting Physician (Dermatology) Latanya Maudlin, MD as Consulting Physician (Orthopedic Surgery) Boyd Kerbs, MD as Referring Physician (Specialist) Dionisio David, [place] as Referring Physician (Chiropractic Medicine) Debbora Dus, [facility] as Pharmacist (Pharmacist)   Hypertension:    Good control on current regimen. BP Readings from Last 3 Encounters:  04/19/20 110/60  04/13/20 109/67  02/23/20 125/65  Using medication without problems or lightheadedness: none  Chest pain with exertion:none Edema:none Short of breath:none Average home BPs: Other issues:  Elevated Cholesterol:  At goal on statin in past.due to for ree-val. Using medications without problems Muscle aches:  Diet compliance: Exercise: walking daily in mornings Other complaints: Wt Readings from Last 3 Encounters:  04/19/20 211 lb (95.7 kg)  04/13/20 212 lb 12.8 oz (96.5 kg)  02/23/20 213 lb (96.6 kg)     Diabetes:   Stable control. Using medications without difficulties: Hypoglycemic episodes: none Hyperglycemic episodes:none Feet problems: no ulcer, stable   neuropahty Blood Sugars averaging: FBS 119-127 eye exam within last year: yes Assocaited with neuropathy.  MDD, stable and in remission. PHQ2 : 0  S/P treatment for lumboscaral discitis.. IV vanc through PICC  Parox afib, pulmonary HT followed by Cards. On Xarelto  This visit occurred during the SARS-CoV-2 public health emergency.  Safety protocols were in place, including screening questions prior to the visit, additional usage of staff PPE, and extensive cleaning of exam room while observing appropriate contact time as indicated for disinfecting solutions.   COVID 19 screen:  No recent travel or known exposure to COVID19 The patient denies respiratory symptoms of COVID 19 at this time. The importance of social distancing was discussed today.     Review of Systems  Constitutional: Negative for chills and fever.  HENT: Negative for congestion and ear pain.   Eyes: Negative for pain and redness.  Respiratory: Negative for cough and shortness of breath.   Cardiovascular: Negative for chest pain, palpitations and leg swelling.  Gastrointestinal: Negative for abdominal pain, blood in stool, constipation, diarrhea, nausea and vomiting.  Genitourinary: Negative for dysuria.  Musculoskeletal: Positive for joint pain. Negative for falls and myalgias.  Skin: Negative for rash.  Neurological: Negative for dizziness.  Psychiatric/Behavioral: Negative for depression. The patient is not nervous/anxious.       Past Medical History:  Diagnosis Date  . Allergic rhinitis   . Allergy   . Arthritis   . Asthma   . Chronic headache   . Diabetes mellitus   . DVT (deep venous thrombosis) ([place])   . Dyspnea   . Heart murmur   . Hypertension   . Penicillin allergy 01/19/2020  . Pulmonary embolism ([place])   . Pulmonary hypertension ([place])   . Seizures ([place])     reports that she has never smoked. She has never used smokeless tobacco. She reports previous alcohol use. She reports that she does not  use drugs.   Current Outpatient Medications:  .  acetaminophen (TYLENOL) 650 MG CR tablet, Take 1,300 mg by mouth daily as needed for pain., Disp: , Rfl:  .  ADVAIR HFA 284-13 MCG/ACT inhaler, INHALE 2 PUFFS BY MOUTH TWICE DAILY TO PREVENT COUGH OR WHEEZING, RINSE MOUTH AFTER USE, Disp: 12 g, Rfl: 4 .  albuterol (VENTOLIN HFA) 108 (90 Base) MCG/ACT inhaler, INHALE 2 PUFFS BY MOUTH INTO THE LUNGS EVERY 6 HOURS AS NEEDED FOR WHEEZING OR SHORTNESS OF BREATH, Disp: 54 g, Rfl: 0 .  antiseptic oral rinse (BIOTENE) LIQD, 15 mLs by Mouth Rinse route in the morning and at bedtime., Disp: , Rfl:  .  Apoaequorin (PREVAGEN) 10 MG CAPS, Take 10 mg by mouth daily., Disp: , Rfl:  .  Artificial Saliva (BIOTENE DRY MOUTH) LOZG, Use as directed 1 lozenge in the mouth or throat daily as needed (dry eyes)., Disp: , Rfl:  .  atorvastatin (LIPITOR) 10 MG tablet, Take 1 tablet (10 mg total) by mouth daily., Disp: 90 tablet, Rfl: 3 .  azelastine (ASTELIN) 0.1 % nasal spray, USE 1-2 SPRAYS IN EACH NOSTRIL  TWICE DAILY AS NEEDED, Disp: 30 mL, Rfl: 5 .  Capsaicin-Menthol (SALONPAS GEL EX), Apply 1 application topically daily as needed (pain)., Disp: , Rfl:  .  Coenzyme Q10 (COQ-10) 100 MG CAPS, Take 100 mg by mouth daily., Disp: , Rfl:  .  cyclobenzaprine (FLEXERIL) 10 MG tablet, Take 1 tablet (10 mg total) by mouth at bedtime as needed for muscle spasms., Disp: 30 tablet, Rfl: 1 .  dextromethorphan-guaiFENesin (MUCINEX DM) 30-600 MG 12hr tablet, Take 1 tablet by mouth 2 (two) times daily as needed for cough., Disp: , Rfl:  .  Empagliflozin-linaGLIPtin (GLYXAMBI) 10-5 MG TABS, Take 1 tablet by mouth daily., Disp: 30 tablet, Rfl: 11 .  EPINEPHrine 0.3 mg/0.3 mL IJ SOAJ injection, USE AS DIRECTED FOR LIFE THREATENING ALLERGIC REACTIONS, Disp: 2 each, Rfl: 2 .  estradiol (ESTRACE) 0.1 MG/GM vaginal cream, 0.5 g of cream intravaginally administered daily for 2 weeks, then reduce to twice weekly, Disp: 42.5 g, Rfl: 12 .   famotidine (PEPCID) 40 MG tablet, TAKE 1 TABLET(40 MG) BY MOUTH DAILY, Disp: 90 tablet, Rfl: 1 .  fluticasone (FLOVENT HFA) 220 MCG/ACT inhaler, INHALE 2 PUFFS BY MOUTH TWICE DAILY DURING FLARE UP AS NEEDED. RINSE, GARGLE AND SPIT AFTER USE, Disp: 12 g, Rfl: 1 .  furosemide (LASIX) 40 MG tablet, TAKE 1 TABLET(40 MG) BY MOUTH DAILY (Patient taking differently: Take 40 mg by mouth daily as needed for edema. ), Disp: 30 tablet, Rfl: 3 .  gabapentin (NEURONTIN) 600 MG tablet, TAKE 1 TABLET BY MOUTH DAILY AT BREAKFAST, 1 TABLET AT LUNCH, AND 2 TABLETS AT BEDTIME, Disp: 360 tablet, Rfl: 1 .  Garlic (GARLIQUE PO), Take 1 tablet by mouth daily., Disp: , Rfl:  .  Homeopathic Products [facility] RELIEF EX), Apply 1 spray topically daily as needed (muscle cramps)., Disp: , Rfl:  .  hydrOXYzine (ATARAX/VISTARIL) 10 MG tablet, Take 1 tablet (10 mg total) by mouth daily as needed for itching., Disp: 15 tablet, Rfl: 0 .  ipratropium (ATROVENT) 0.06 % nasal spray, Place 1-2 sprays each nostril 1-2 times per day (Patient taking differently: Place 2 sprays into both nostrils 2 (two) times daily as needed for rhinitis. ), Disp: 15 mL, Rfl: 5 .  irbesartan (AVAPRO) 150 MG tablet, TAKE 1 TABLET(150 MG) BY MOUTH DAILY, Disp: 30 tablet, Rfl: 9 .  Lactase (DAIRY-RELIEF PO), Take 1 capsule by mouth daily as needed (eating dairy). , Disp: , Rfl:  .  levalbuterol (XOPENEX) 1.25 MG/3ML nebulizer solution, USE 1 VIAL VIA NEBULIZER EVERY 6 HOURS AS NEEDED FOR WHEEZING OR SHORTNESS OF BREATH, Disp: 9 mL, Rfl: 1 .  meclizine (ANTIVERT) 12.5 MG tablet, Take 1 tablet (12.5 mg total) by mouth 3 (three) times daily as needed for dizziness., Disp: 15 tablet, Rfl: 0 .  methimazole (TAPAZOLE) 5 MG tablet, TAKE 1 TABLET BY MOUTH IN THE MORNING AND 1/2 TABLET IN THE EVENING WITH MEALS, Disp: 225 tablet, Rfl: 0 .  Misc Natural Products (GLUCOSAMINE CHONDROITIN TRIPLE) TABS, Take 2 tablets by mouth daily., Disp: , Rfl:  .  montelukast  (SINGULAIR) 10 MG tablet, Take 1 tablet (10 mg total) by mouth daily., Disp: 90 tablet, Rfl: 1 .  nystatin (MYCOSTATIN) 100000 UNIT/ML suspension, SWISH AND SWALLOW 5 ML BY MOUTH TWICE DAILY AFTER ADVAIR, Disp: 60 mL, Rfl: 5 .  omalizumab (XOLAIR) 150 MG injection, Inject 300 mg into the skin every 28 (twenty-eight) days., Disp: , Rfl:  .  ondansetron (ZOFRAN-ODT) 4 MG disintegrating tablet, Take 4 mg by  mouth every 8 (eight) hours as needed for nausea or vomiting. , Disp: , Rfl:  .  OneTouch Delica Lancets 83M MISC, Use to check blood sugar up to 2 times a day., Disp: 100 each, Rfl: 5 .  ONETOUCH VERIO test strip, Use to check blood sugar up to 2 times a day, Disp: 100 each, Rfl: 5 .  oxybutynin (DITROPAN-XL) 5 MG 24 hr tablet, Take 5 mg by mouth daily., Disp: , Rfl:  .  pantoprazole (PROTONIX) 40 MG tablet, TAKE 1 TABLET(40 MG) BY MOUTH DAILY, Disp: 90 tablet, Rfl: 1 .  PATADAY 0.2 % SOLN, Place 1 drop into both eyes daily as needed (allergies). , Disp: , Rfl: 4 .  Polyethyl Glycol-Propyl Glycol (SYSTANE OP), Place 1 drop into both eyes 2 (two) times daily., Disp: , Rfl:  .  Potassium 99 MG TABS, Take 99 mg by mouth daily., Disp: , Rfl:  .  Probiotic Product (ALIGN) 4 MG CAPS, Take 4 mg by mouth daily. , Disp: , Rfl:  .  Psyllium (METAMUCIL FIBER PO), Take by mouth daily as needed., Disp: , Rfl:  .  Pumpkin Seed-Soy Germ (AZO BLADDER CONTROL/GO-LESS) CAPS, Take 1 tablet by mouth 2 (two) times daily., Disp: , Rfl:  .  Tiotropium Bromide Monohydrate (SPIRIVA RESPIMAT) 1.25 MCG/ACT AERS, Inhale 2 puffs into the lungs daily., Disp: 4 g, Rfl: 5 .  triamcinolone cream (KENALOG) 0.1 %, Apply 1 application topically 2 (two) times daily. (Patient taking differently: Apply 1 application topically 2 (two) times daily as needed (itching). ), Disp: 30 g, Rfl: 0 .  TRULANCE 3 MG TABS, Take 3 mg by mouth daily. , Disp: , Rfl:  .  TURMERIC PO, Take 1,000 mg by mouth daily., Disp: , Rfl:  .  VOLTAREN 1 % GEL,  Apply 2 g topically 4 (four) times daily. (Patient taking differently: Apply 2 g topically 4 (four) times daily as needed (pain). ), Disp: 100 g, Rfl: 2 .  XARELTO 20 MG TABS tablet, TAKE 1 TABLET(20 MG) BY MOUTH DAILY WITH SUPPER, Disp: 90 tablet, Rfl: 1  Current Facility-Administered Medications:  .  omalizumab Arvid Right) injection 300 mg, 300 mg, Subcutaneous, Q28 days, Kozlow, Donnamarie Poag, MD, 300 mg at 04/12/20 1046   Observations/Objective: Blood pressure 110/60, pulse 88, temperature 98.3 F (36.8 C), temperature source Temporal, height 5' 3.5" (1.613 m), weight 211 lb (95.7 kg), SpO2 96 %.  Physical Exam Constitutional:      General: She is not in acute distress.    Appearance: Normal appearance. She is well-developed. She is obese. She is not ill-appearing or toxic-appearing.  HENT:     Head: Normocephalic.     Right Ear: Hearing, tympanic membrane, ear canal and external ear normal.     Left Ear: Hearing, tympanic membrane, ear canal and external ear normal.     Nose: Nose normal.  Eyes:     General: Lids are normal. Lids are everted, no foreign bodies appreciated.     Conjunctiva/sclera: Conjunctivae normal.     Pupils: Pupils are equal, round, and reactive to light.  Neck:     Thyroid: No thyroid mass or thyromegaly.     Vascular: No carotid bruit.     Trachea: Trachea normal.  Cardiovascular:     Rate and Rhythm: Normal rate and regular rhythm.     Heart sounds: Normal heart sounds, S1 normal and S2 normal. No murmur heard.  No gallop.   Pulmonary:     Effort: Pulmonary effort  is normal. No respiratory distress.     Breath sounds: Normal breath sounds. No wheezing, rhonchi or rales.  Abdominal:     General: Bowel sounds are normal. There is no distension or abdominal bruit.     Palpations: Abdomen is soft. There is no fluid wave or mass.     Tenderness: There is no abdominal tenderness. There is no guarding or rebound.     Hernia: No hernia is present.  Musculoskeletal:      Cervical back: Normal range of motion and neck supple.  Lymphadenopathy:     Cervical: No cervical adenopathy.  Skin:    General: Skin is warm and dry.     Findings: No rash.  Neurological:     Mental Status: She is alert.     Cranial Nerves: No cranial nerve deficit.     Sensory: No sensory deficit.  Psychiatric:        Mood and Affect: Mood is not anxious or depressed.        Speech: Speech normal.        Behavior: Behavior normal. Behavior is cooperative.        Judgment: Judgment normal.      Diabetic foot exam: Normal inspection No skin breakdown No calluses  Normal DP pulses Normal sensation to light touch and monofilament Nails normal  Assessment and Plan   The patient's preventative maintenance and recommended screening tests for an annual wellness exam were reviewed in full today. Brought up to date unless services declined.  Counselled on the importance of diet, exercise, and its role in overall health and mortality. The patient's FH and SH was reviewed, including their home life, tobacco status, and drug and alcohol status.   Vaccines: S/P COVID x 2 , plan 3rd dose, refuses high dose flu ( had vomiting in past), consider shingrix series, S/P PNA vaccines Colon:2019 nml repeat in 2024 Mammogram: uptodate 01/2020 Hep C: neg nonsmoker  Hypertension associated with diabetes ([place]) Well controlled. Continue current medication.   Hyperlipidemia associated with type 2 diabetes mellitus (H[facility]) Due fo re-eval. on statin.  Depression, major, in remission Stable in remission.  Diabetes mellitus with neuropathy (HCC)  Stable control on Glyxambi  Neuropathy due to secondary diabetes Tolerable control on gabapentin.  Paroxysmal atrial fibrillation with rapid ventricular response ([place]) Followed by cardiology. On Xarelto anticoagulation. Rate controlled  In sinus in office today.  Pulmonary hypertension  Followed by cardiology.   [name], MD

## 2020-04-19 NOTE — Assessment & Plan Note (Signed)
Well controlled. Continue current medication.  

## 2020-04-19 NOTE — Assessment & Plan Note (Signed)
Stable control on Glyxambi

## 2020-04-19 NOTE — Assessment & Plan Note (Signed)
Tolerable control on gabapentin.

## 2020-04-19 NOTE — Patient Instructions (Addendum)
Make appt for labs on you way out.. fasting.    Preventive Care 73 Years and Older, Female Preventive care refers to lifestyle choices and visits with your health care provider that can promote health and wellness. This includes:  A yearly physical exam. This is also called an annual well check.  Regular dental and eye exams.  Immunizations.  Screening for certain conditions.  Healthy lifestyle choices, such as diet and exercise. What can I expect for my preventive care visit? Physical exam Your health care provider will check:  Height and weight. These may be used to calculate body mass index (BMI), which is a measurement that tells if you are at a healthy weight.  Heart rate and blood pressure.  Your skin for abnormal spots. Counseling Your health care provider may ask you questions about:  Alcohol, tobacco, and drug use.  Emotional well-being.  Home and relationship well-being.  Sexual activity.  Eating habits.  History of falls.  Memory and ability to understand (cognition).  Work and work Statistician.  Pregnancy and menstrual history. What immunizations do I need?  Influenza (flu) vaccine  This is recommended every year. Tetanus, diphtheria, and pertussis (Tdap) vaccine  You may need a Td booster every 10 years. Varicella (chickenpox) vaccine  You may need this vaccine if you have not already been vaccinated. Zoster (shingles) vaccine  You may need this after age 73. Pneumococcal conjugate (PCV13) vaccine  One dose is recommended after age 73. Pneumococcal polysaccharide (PPSV23) vaccine  One dose is recommended after age 73. Measles, mumps, and rubella (MMR) vaccine  You may need at least one dose of MMR if you were born in 1957 or later. You may also need a second dose. Meningococcal conjugate (MenACWY) vaccine  You may need this if you have certain conditions. Hepatitis A vaccine  You may need this if you have certain conditions or if  you travel or work in places where you may be exposed to hepatitis A. Hepatitis B vaccine  You may need this if you have certain conditions or if you travel or work in places where you may be exposed to hepatitis B. Haemophilus influenzae type b (Hib) vaccine  You may need this if you have certain conditions. You may receive vaccines as individual doses or as more than one vaccine together in one shot (combination vaccines). Talk with your health care provider about the risks and benefits of combination vaccines. What tests do I need? Blood tests  Lipid and cholesterol levels. These may be checked every 5 years, or more frequently depending on your overall health.  Hepatitis C test.  Hepatitis B test. Screening  Lung cancer screening. You may have this screening every year starting at age 73 if you have a 30-pack-year history of smoking and currently smoke or have quit within the past 15 years.  Colorectal cancer screening. All adults should have this screening starting at age 73 and continuing until age 12. Your health care provider may recommend screening at age 73 if you are at increased risk. You will have tests every 1-10 years, depending on your results and the type of screening test.  Diabetes screening. This is done by checking your blood sugar (glucose) after you have not eaten for a while (fasting). You may have this done every 1-3 years.  Mammogram. This may be done every 1-2 years. Talk with your health care provider about how often you should have regular mammograms.  BRCA-related cancer screening. This may be done if  you have a family history of breast, ovarian, tubal, or peritoneal cancers. Other tests  Sexually transmitted disease (STD) testing.  Bone density scan. This is done to screen for osteoporosis. You may have this done starting at age 73. Follow these instructions at home: Eating and drinking  Eat a diet that includes fresh fruits and vegetables, whole  grains, lean protein, and low-fat dairy products. Limit your intake of foods with high amounts of sugar, saturated fats, and salt.  Take vitamin and mineral supplements as recommended by your health care provider.  Do not drink alcohol if your health care provider tells you not to drink.  If you drink alcohol: ? Limit how much you have to 0-1 drink a day. ? Be aware of how much alcohol is in your drink. In the [place] one drink equals one 12 oz bottle of beer (355 mL), one 5 oz glass of wine (148 mL), or one 1 oz glass of hard liquor (44 mL). Lifestyle  Take daily care of your teeth and gums.  Stay active. Exercise for at least 30 minutes on 5 or more days each week.  Do not use any products that contain nicotine or tobacco, such as cigarettes, e-cigarettes, and chewing tobacco. If you need help quitting, ask your health care provider.  If you are sexually active, practice safe sex. Use a condom or other form of protection in order to prevent STIs (sexually transmitted infections).  Talk with your health care provider about taking a low-dose aspirin or statin. What's next?  Go to your health care provider once a year for a well check visit.  Ask your health care provider how often you should have your eyes and teeth checked.  Stay up to date on all vaccines. This information is not intended to replace advice given to you by your health care provider. Make sure you discuss any questions you have with your health care provider. Document Revised: 06/26/2018 Document Reviewed: 06/26/2018 Elsevier Patient Education  2020 [employer].

## 2020-04-20 ENCOUNTER — Other Ambulatory Visit: Payer: Self-pay

## 2020-04-20 ENCOUNTER — Other Ambulatory Visit (INDEPENDENT_AMBULATORY_CARE_PROVIDER_SITE_OTHER): Payer: Medicare Other

## 2020-04-20 DIAGNOSIS — E1140 Type 2 diabetes mellitus with diabetic neuropathy, unspecified: Secondary | ICD-10-CM

## 2020-04-20 LAB — COMPREHENSIVE METABOLIC PANEL
ALT: 7 U/L (ref 0–35)
AST: 15 U/L (ref 0–37)
Albumin: 3.9 g/dL (ref 3.5–5.2)
Alkaline Phosphatase: 84 U/L (ref 39–117)
BUN: 10 mg/dL (ref 6–23)
CO2: 24 mEq/L (ref 19–32)
Calcium: 8.9 mg/dL (ref 8.4–10.5)
Chloride: 110 mEq/L (ref 96–112)
Creatinine, Ser: 0.91 mg/dL (ref 0.40–1.20)
GFR: 62.27 mL/min (ref >60.00)
Glucose, Bld: 97 mg/dL (ref 70–99)
Potassium: 3.6 mEq/L (ref 3.5–5.1)
Sodium: 141 mEq/L (ref 135–145)
Total Bilirubin: 0.8 mg/dL (ref 0.2–1.2)
Total Protein: 6.7 g/dL (ref 6.0–8.3)

## 2020-04-20 LAB — LIPID PANEL
Cholesterol: 117 mg/dL (ref 0–200)
HDL: 46.6 mg/dL (ref >39.00)
LDL Cholesterol: 56 mg/dL (ref 0–99)
NonHDL: 70.24
Total CHOL/HDL Ratio: 3
Triglycerides: 71 mg/dL (ref 0.0–149.0)
VLDL: 14.2 mg/dL (ref 0.0–40.0)

## 2020-04-20 LAB — HEMOGLOBIN A1C: Hgb A1c MFr Bld: 6.2 % (ref 4.6–6.5)

## 2020-04-20 NOTE — Telephone Encounter (Signed)
[name] notified as instructed by telephone.  She would like to get a nutrition referral.  She prefers [place] location.

## 2020-04-20 NOTE — Addendum Note (Signed)
Addended by: [name] E on: 04/20/2020 11:17 AM   Modules accepted: Orders

## 2020-04-21 ENCOUNTER — Ambulatory Visit: Payer: Medicare Other | Admitting: Skilled Nursing Facility1

## 2020-04-22 ENCOUNTER — Ambulatory Visit: Payer: Medicare Other

## 2020-04-22 ENCOUNTER — Other Ambulatory Visit: Payer: Medicare Other

## 2020-04-26 ENCOUNTER — Other Ambulatory Visit: Payer: Self-pay

## 2020-04-26 ENCOUNTER — Ambulatory Visit (INDEPENDENT_AMBULATORY_CARE_PROVIDER_SITE_OTHER): Payer: Medicare Other | Admitting: Internal Medicine

## 2020-04-26 ENCOUNTER — Encounter: Payer: Self-pay | Admitting: Internal Medicine

## 2020-04-26 VITALS — BP 150/80 | HR 97 | Ht 63.5 in | Wt 211.6 lb

## 2020-04-26 DIAGNOSIS — E041 Nontoxic single thyroid nodule: Secondary | ICD-10-CM | POA: Diagnosis not present

## 2020-04-26 DIAGNOSIS — E0500 Thyrotoxicosis with diffuse goiter without thyrotoxic crisis or storm: Secondary | ICD-10-CM

## 2020-04-26 LAB — T3, FREE: T3, Free: 3.1 pg/mL (ref 2.3–4.2)

## 2020-04-26 LAB — T4, FREE: Free T4: 0.97 ng/dL (ref 0.60–1.60)

## 2020-04-26 LAB — TSH: TSH: 3.82 u[IU]/mL (ref 0.35–4.50)

## 2020-04-26 NOTE — Progress Notes (Signed)
Patient ID: [name], female   DOB: 10-17-1945, 74 y.o.   MRN: [id number]   This visit occurred during the SARS-CoV-2 public health emergency.  Safety protocols were in place, including screening questions prior to the visit, additional usage of staff PPE, and extensive cleaning of exam room while observing appropriate contact time as indicated for disinfecting solutions.   HPI  [name] is a 74 y.o.-year-old female, returning for f/u for for thyrotoxicosis and thyroid nodule.  She previously saw another endocrinologist (Dr. Wilson Singer), but only for her diabetes. She is the sister of [name], also my pt. Last OV with me 6 months ago.  She is currently treated for discitis-had a PICC line >> stopped. She saw ID. Still back pain but improved.  Reviewed and addended history: Patient was found to be thyrotoxic in 08/2015 but she reports that she was only informed about this in 2018.  Thyroid scan indicated possible Graves' disease and also a cold left thyroid nodule.    At that time, she was recommended surgery but she did not want to proceed with this and started to see me.    We started methimazole. She started to feel better on methimazole, less fatigued. In 09/2018 we decreased the dose of methimazole to 2.5 mg daily.  She has no side effects from methimazole.  She also continues on diltiazem CD.  Her left thyroid nodule was biopsied with benign results in 2019.    Reviewed her TFTs: Lab Results  Component Value Date   TSH 1.25 11/03/2019   TSH 1.39 04/06/2019   TSH 1.84 11/27/2018   TSH 4.30 10/06/2018   TSH 4.03 08/05/2018   TSH 6.04 (H) 06/05/2018   TSH <0.01 (L) 02/06/2018   TSH <0.01 (L) 09/27/2017   TSH 0.01 (L) 02/05/2017   TSH 0.07 (L) 03/22/2016   FREET4 1.02 11/03/2019   FREET4 0.98 04/06/2019   FREET4 0.93 11/27/2018   FREET4 0.75 10/06/2018   FREET4 0.88 08/05/2018   FREET4 0.59 (L) 06/05/2018   FREET4 1.89 (H) 02/06/2018   FREET4 1.94 (H) 09/27/2017    FREET4 1.19 02/05/2017   FREET4 1.91 (H) 03/22/2016   T3FREE 2.5 11/03/2019   T3FREE 2.7 04/06/2019   T3FREE 3.0 11/27/2018   T3FREE 3.1 10/06/2018   T3FREE 2.9 08/05/2018   T3FREE 2.6 06/05/2018   T3FREE 3.9 02/06/2018   T3FREE 3.4 09/27/2017   T3FREE 3.5 02/05/2017   T3FREE 3.8 03/22/2016   Thyroid uptake and scan (10/31/2017): Increased uptake of 40%, and cold left inferior pole nodule.  Thyroid U/S (11/12/2017): 3.2 cm left inferior TR 3 nodule correlates with the cold nodule by nuclear medicine scan. This nodule meets criteria for biopsy as above.  FNA of her left thyroid nodule (12/04/2017): Benign  Thyroid U/S (10/23/2019): Slightly enlarged gland but not due to enlargement of the thyroid nodule. It is possible that this is due to more inflammation. Parenchymal Echotexture: Mildly heterogenous Isthmus: Normal in size measuring 0.6 cm in diameter, unchanged Right lobe: Normal in size measuring 5.3 x 1.4 x 1.6 cm, previously, 4.1 x 1.2 x 1.5 cm Left lobe: Enlarged measuring 6.9 x 3.9 x 2.4 cm, previously, 6.1 x 2.1 x 2.3 cm. ___________________________________________________________  The previously biopsied approximately 3.0 x 2.7 x 1.9 cm isoechoic ill-defined nodule within the mid/inferior aspect of the left lobe of the thyroid is grossly unchanged in size compared to the 10/2017 examination, previously, 3.2 x 2.5 x 2.0 cm. Correlation with previous biopsy results is  advised.  IMPRESSION: 1. Similar appearing mildly heterogeneous and borderline enlarged thyroid gland without worrisome new or enlarging thyroid nodule. 2. Previously biopsied nodule within the mid/inferior aspect the left lobe of the thyroid is grossly unchanged compared to the 10/2017 examination. Correlation with previous biopsy results is advised. Assuming a benign pathologic diagnosis, repeat sampling and/or continued dedicated follow-up is not recommend  Her Graves' antibodies were  elevated: Lab Results  Component Value Date   TSI 331 (H) 11/03/2019   TSI 395 (H) 04/06/2019   TSI 361 (H) 02/06/2018   Pt denies: - feeling nodules in neck - hoarseness - dysphagia - choking - SOB with lying down  No FH of thyroid disease or cancer. No h/o radiation tx to head or neck.  No herbal supplements. No Biotin use. No recent steroids use.  Previously on biotin, stopped before last visit. In the past she got steroid courses for her back.  She also has a history of type 2 diabetes, urinary urgency. She has asthma, on 2 inhalers. Sees Dr. Neldon Mc.  ROS: Constitutional: no weight gain/no weight loss, no fatigue, no subjective hyperthermia, no subjective hypothermia Eyes: no blurry vision, no xerophthalmia ENT: no sore throat, no nodules palpated in neck, no dysphagia, no odynophagia, no hoarseness Cardiovascular: no CP/no SOB/no palpitations/no leg swelling Respiratory: no cough/no SOB/no wheezing Gastrointestinal: no N/no V/+ D/no C/no acid reflux Musculoskeletal: no muscle aches/no joint aches Skin: no rashes, no hair loss Neurological: no tremors/no numbness/no tingling/no dizziness  I reviewed pt's medications, allergies, PMH, social hx, family hx, and changes were documented in the history of present illness. Otherwise, unchanged from my initial visit note.  Past Medical History:  Diagnosis Date  . Allergic rhinitis   . Allergy   . Arthritis   . Asthma   . Chronic headache   . Diabetes mellitus   . DVT (deep venous thrombosis) ([place])   . Dyspnea   . Heart murmur   . Hypertension   . Penicillin allergy 01/19/2020  . Pulmonary embolism ([place])   . Pulmonary hypertension ([place])   . Seizures ([place])    Past Surgical History:  Procedure Laterality Date  . ABDOMINAL HYSTERECTOMY     partial, has ovaries  . CARDIAC CATHETERIZATION  12/28/2010   Mod. pulmonary hypertension, normal coronary arteries  . DOPPLER ECHOCARDIOGRAPHY  10/08/2011   EF=>55%,mild asymmetric  LVH, mod. TR, mod. PH, mild to mod LA dilatation  . IR LUMBAR DISC ASPIRATION W/IMG GUIDE  01/12/2020  . KNEE ARTHROSCOPY Left   . KNEE SURGERY    . Nuclear Stress Test  05/20/2006   No ischemia  . PARTIAL HYSTERECTOMY    . PLANTAR FASCIA SURGERY    . TONSILLECTOMY     Social History   Socioeconomic History  . Marital status: Widowed    Spouse name: Not on file  . Number of children: Y  . Years of education: Not on file  . Highest education level: Not on file  Occupational History  . Occupation: retired Geologist, engineering.   Tobacco Use  . Smoking status: Never Smoker  . Smokeless tobacco: Never Used  Vaping Use  . Vaping Use: Never used  Substance and Sexual Activity  . Alcohol use: Not Currently    Alcohol/week: 0.0 standard drinks    Comment: occ glass on wine  . Drug use: No  . Sexual activity: Never  Other Topics Concern  . Not on file  Social History Narrative   Widow    limited exercise.  Social Determinants of Health   Financial Resource Strain: Low Risk   . Difficulty of Paying Living Expenses: Not hard at all  Food Insecurity:   . Worried About Charity fundraiser in the Last Year: Not on file  . Ran Out of Food in the Last Year: Not on file  Transportation Needs:   . Lack of Transportation (Medical): Not on file  . Lack of Transportation (Non-Medical): Not on file  Physical Activity:   . Days of Exercise per Week: Not on file  . Minutes of Exercise per Session: Not on file  Stress:   . Feeling of Stress : Not on file  Social Connections:   . Frequency of Communication with Friends and Family: Not on file  . Frequency of Social Gatherings with Friends and Family: Not on file  . Attends Religious Services: Not on file  . Active Member of Clubs or Organizations: Not on file  . Attends Archivist Meetings: Not on file  . Marital Status: Not on file  Intimate Partner Violence:   . Fear of Current or Ex-Partner: Not on file  . Emotionally Abused:  Not on file  . Physically Abused: Not on file  . Sexually Abused: Not on file   Current Outpatient Medications on File Prior to Visit  Medication Sig Dispense Refill  . acetaminophen (TYLENOL) 650 MG CR tablet Take 1,300 mg by mouth daily as needed for pain.    Marland Kitchen ADVAIR HFA 230-21 MCG/ACT inhaler INHALE 2 PUFFS BY MOUTH TWICE DAILY TO PREVENT COUGH OR WHEEZING, RINSE MOUTH AFTER USE 12 g 4  . albuterol (VENTOLIN HFA) 108 (90 Base) MCG/ACT inhaler INHALE 2 PUFFS BY MOUTH INTO THE LUNGS EVERY 6 HOURS AS NEEDED FOR WHEEZING OR SHORTNESS OF BREATH 54 g 0  . antiseptic oral rinse (BIOTENE) LIQD 15 mLs by Mouth Rinse route in the morning and at bedtime.    Marland Kitchen Apoaequorin (PREVAGEN) 10 MG CAPS Take 10 mg by mouth daily.    . Artificial Saliva (BIOTENE DRY MOUTH) LOZG Use as directed 1 lozenge in the mouth or throat daily as needed (dry eyes).    Marland Kitchen atorvastatin (LIPITOR) 10 MG tablet Take 1 tablet (10 mg total) by mouth daily. 90 tablet 3  . azelastine (ASTELIN) 0.1 % nasal spray USE 1-2 SPRAYS IN EACH NOSTRIL TWICE DAILY AS NEEDED 30 mL 5  . Capsaicin-Menthol (SALONPAS GEL EX) Apply 1 application topically daily as needed (pain).    . Coenzyme Q10 (COQ-10) 100 MG CAPS Take 100 mg by mouth daily.    . cyclobenzaprine (FLEXERIL) 10 MG tablet Take 1 tablet (10 mg total) by mouth at bedtime as needed for muscle spasms. 30 tablet 1  . dextromethorphan-guaiFENesin (MUCINEX DM) 30-600 MG 12hr tablet Take 1 tablet by mouth 2 (two) times daily as needed for cough.    . Empagliflozin-linaGLIPtin (GLYXAMBI) 10-5 MG TABS Take 1 tablet by mouth daily. 30 tablet 11  . EPINEPHrine 0.3 mg/0.3 mL IJ SOAJ injection USE AS DIRECTED FOR LIFE THREATENING ALLERGIC REACTIONS 2 each 2  . estradiol (ESTRACE) 0.1 MG/GM vaginal cream 0.5 g of cream intravaginally administered daily for 2 weeks, then reduce to twice weekly 42.5 g 12  . famotidine (PEPCID) 40 MG tablet TAKE 1 TABLET(40 MG) BY MOUTH DAILY 90 tablet 1  . fluticasone  (FLOVENT HFA) 220 MCG/ACT inhaler INHALE 2 PUFFS BY MOUTH TWICE DAILY DURING FLARE UP AS NEEDED. RINSE, GARGLE AND SPIT AFTER USE 12 g 1  .  furosemide (LASIX) 40 MG tablet TAKE 1 TABLET(40 MG) BY MOUTH DAILY (Patient taking differently: Take 40 mg by mouth daily as needed for edema. ) 30 tablet 3  . gabapentin (NEURONTIN) 600 MG tablet TAKE 1 TABLET BY MOUTH DAILY AT BREAKFAST, 1 TABLET AT LUNCH, AND 2 TABLETS AT BEDTIME 360 tablet 1  . Garlic (GARLIQUE PO) Take 1 tablet by mouth daily.    . Homeopathic Products [facility] RELIEF EX) Apply 1 spray topically daily as needed (muscle cramps).    . hydrOXYzine (ATARAX/VISTARIL) 10 MG tablet Take 1 tablet (10 mg total) by mouth daily as needed for itching. 15 tablet 0  . ipratropium (ATROVENT) 0.06 % nasal spray Place 1-2 sprays each nostril 1-2 times per day (Patient taking differently: Place 2 sprays into both nostrils 2 (two) times daily as needed for rhinitis. ) 15 mL 5  . irbesartan (AVAPRO) 150 MG tablet TAKE 1 TABLET(150 MG) BY MOUTH DAILY 30 tablet 9  . Lactase (DAIRY-RELIEF PO) Take 1 capsule by mouth daily as needed (eating dairy).     Marland Kitchen levalbuterol (XOPENEX) 1.25 MG/3ML nebulizer solution USE 1 VIAL VIA NEBULIZER EVERY 6 HOURS AS NEEDED FOR WHEEZING OR SHORTNESS OF BREATH 9 mL 1  . meclizine (ANTIVERT) 12.5 MG tablet Take 1 tablet (12.5 mg total) by mouth 3 (three) times daily as needed for dizziness. 15 tablet 0  . methimazole (TAPAZOLE) 5 MG tablet TAKE 1 TABLET BY MOUTH IN THE MORNING AND 1/2 TABLET IN THE EVENING WITH MEALS 225 tablet 0  . Misc Natural Products (GLUCOSAMINE CHONDROITIN TRIPLE) TABS Take 2 tablets by mouth daily.    . montelukast (SINGULAIR) 10 MG tablet Take 1 tablet (10 mg total) by mouth daily. 90 tablet 1  . nystatin (MYCOSTATIN) 100000 UNIT/ML suspension SWISH AND SWALLOW 5 ML BY MOUTH TWICE DAILY AFTER ADVAIR 60 mL 5  . omalizumab (XOLAIR) 150 MG injection Inject 300 mg into the skin every 28 (twenty-eight) days.    .  ondansetron (ZOFRAN-ODT) 4 MG disintegrating tablet Take 4 mg by mouth every 8 (eight) hours as needed for nausea or vomiting.     [name] Delica Lancets 93O MISC Use to check blood sugar up to 2 times a day. 100 each 5  . ONETOUCH VERIO test strip Use to check blood sugar up to 2 times a day 100 each 5  . oxybutynin (DITROPAN-XL) 5 MG 24 hr tablet Take 5 mg by mouth daily.    . pantoprazole (PROTONIX) 40 MG tablet TAKE 1 TABLET(40 MG) BY MOUTH DAILY 90 tablet 1  . PATADAY 0.2 % SOLN Place 1 drop into both eyes daily as needed (allergies).   4  . Polyethyl Glycol-Propyl Glycol (SYSTANE OP) Place 1 drop into both eyes 2 (two) times daily.    . Potassium 99 MG TABS Take 99 mg by mouth daily.    . Probiotic Product (ALIGN) 4 MG CAPS Take 4 mg by mouth daily.     . Psyllium (METAMUCIL FIBER PO) Take by mouth daily as needed.    . Pumpkin Seed-Soy Germ (AZO BLADDER CONTROL/GO-LESS) CAPS Take 1 tablet by mouth 2 (two) times daily.    . Tiotropium Bromide Monohydrate (SPIRIVA RESPIMAT) 1.25 MCG/ACT AERS Inhale 2 puffs into the lungs daily. 4 g 5  . triamcinolone cream (KENALOG) 0.1 % Apply 1 application topically 2 (two) times daily. (Patient taking differently: Apply 1 application topically 2 (two) times daily as needed (itching). ) 30 g 0  . TRULANCE 3  MG TABS Take 3 mg by mouth daily.     . TURMERIC PO Take 1,000 mg by mouth daily.    . VOLTAREN 1 % GEL Apply 2 g topically 4 (four) times daily. (Patient taking differently: Apply 2 g topically 4 (four) times daily as needed (pain). ) 100 g 2  . XARELTO 20 MG TABS tablet TAKE 1 TABLET(20 MG) BY MOUTH DAILY WITH SUPPER 90 tablet 1   Current Facility-Administered Medications on File Prior to Visit  Medication Dose Route Frequency Provider Last Rate Last Admin  . omalizumab Arvid Right) injection 300 mg  300 mg Subcutaneous Q28 days [name], MD   300 mg at 04/12/20 1046   Allergies  Allergen Reactions  . Latex Hives  . Penicillins Swelling     Has patient had a PCN reaction causing immediate rash, facial/tongue/throat swelling, SOB or lightheadedness with hypotension patient had a PCN reaction causing severe rash involving mucus membranes or skin necrosis: [id number]} Has patient had a PCN reaction that required hospitalization/No Has patient had a PCN reaction occurring within the last 10 years: No If all of the above answers are "NO", then may proceed with Cephalosporin use.     Recardo Evangelist [Pregabalin] Other (See Comments)    Blurred vision.  Marland Kitchen Phenytoin Sodium Extended Swelling  . Vicodin [Hydrocodone-Acetaminophen] Nausea And Vomiting   Family History  Problem Relation Age of Onset  . Hypertension Mother   . Clotting disorder Mother   . Breast cancer Mother   . Arthritis Mother   . Stroke Mother   . Diabetes Mother   . Cancer Brother   . Alcohol abuse Father   . Arthritis Sister   . Diabetes Sister   . Multiple sclerosis Sister   . Allergies Other        grandson  . Allergic rhinitis Neg Hx   . Angioedema Neg Hx   . Asthma Neg Hx   . Atopy Neg Hx   . Eczema Neg Hx   . Immunodeficiency Neg Hx   . Urticaria Neg Hx     PE: BP (!) 150/80   Pulse 97   Ht 5' 3.5" (1.613 m)   Wt 211 lb 9.6 oz (96 kg)   SpO2 97%   BMI 36.90 kg/m  Wt Readings from Last 3 Encounters:  04/26/20 211 lb 9.6 oz (96 kg)  04/19/20 211 lb (95.7 kg)  04/13/20 212 lb 12.8 oz (96.5 kg)   Constitutional: overweight, in NAD Eyes: PERRLA, EOMI, no exophthalmos ENT: moist mucous membranes, no thyromegaly, no cervical lymphadenopathy Cardiovascular: tachycardia, RR, No MRG Respiratory: CTA B Gastrointestinal: abdomen soft, NT, ND, BS+ Musculoskeletal: no deformities, strength intact in all 4 Skin: moist, warm, no rashes Neurological: no tremor with outstretched hands, DTR normal in all 4  ASSESSMENT: 1.  Graves' disease  2.  Left thyroid nodule  PLAN:  1. Patient with history of thyrotoxicosis consistent with Graves' disease.  She initially had the following thyrotoxic symptoms: Fatigue, heat intolerance, insomnia, but no palpitations, anxiety, hyper defecation.  The thyroid uptake and scan suggested Graves' disease and TSI antibodies confirmed the diagnosis. She was started on methimazole and we were able to decrease the dose to 2.5 mg daily 09/2018. She continues on Cardizem CD, which is chronic treatment for her. -She continues to feel well on low-dose methimazole; lost 5 lbs; has diarrhea - possibly 2/2 ABx - will see GI -She is aware of possible modalities of treatment for Graves' disease, to include methimazole  use, RAI treatment, and, last resort, surgery. She is responding well to methimazole so we will continue a low dose for now. I am hoping that we can stop the medication completely in the near future. Will check TSI's >> if normal, we would be more likely to be able to stop MMI. -Reviewed latest TFTs Lab Results  Component Value Date   TSH 1.25 11/03/2019  -At today's visit we will recheck her TSH, free T4, free T3 and will add TSIs -I will see her back in a year  2.  Left thyroid nodule -This appears to be called on the thyroid uptake and scan -Biopsy from 11/2017 was benign -She denies neck compression symptoms except for occasional choking when food gets stuck in her throat-chronic -Reviewed the results of the thyroid ultrasound obtained after last visit: The nodule was stable and no follow-up was recommended. - will continue to follow her clinically for now  Component     Latest Ref Rng & Units 04/26/2020  TSH     0.35 - 4.50 uIU/mL 3.82  T4,Free(Direct)     0.60 - 1.60 ng/dL 0.97  Triiodothyronine,Free,Serum     2.3 - 4.2 pg/mL 3.1  TSI     <140 % baseline 376 (H)  TSH is increased and, while still normal, is close to the upper limit of normal.  We can go ahead and stop methimazole and recheck her thyroid tests in approximately 1 month.  TSI's are still elevated.  Philemon Kingdom, MD  PhD [facility] Endocrinology

## 2020-04-26 NOTE — Patient Instructions (Signed)
Please stop at the lab.  Please continue Methimazole 2.5 mg daily.  Please come back for a follow-up appointment in 1 year.

## 2020-04-28 ENCOUNTER — Other Ambulatory Visit: Payer: Self-pay | Admitting: Family Medicine

## 2020-04-28 NOTE — Telephone Encounter (Signed)
Last office visit 04/19/2020 for [place].  Last refilled 12/11/2018 for #30 with 1 refill.  CPE 2 scheduled for 04/27/2021.

## 2020-05-04 LAB — THYROID STIMULATING IMMUNOGLOBULIN: TSI: 376 % baseline — ABNORMAL HIGH (ref <140)

## 2020-05-09 DIAGNOSIS — J4550 Severe persistent asthma, uncomplicated: Secondary | ICD-10-CM | POA: Diagnosis not present

## 2020-05-10 ENCOUNTER — Ambulatory Visit (INDEPENDENT_AMBULATORY_CARE_PROVIDER_SITE_OTHER): Payer: Medicare Other

## 2020-05-10 ENCOUNTER — Other Ambulatory Visit: Payer: Self-pay

## 2020-05-10 DIAGNOSIS — J4550 Severe persistent asthma, uncomplicated: Secondary | ICD-10-CM | POA: Diagnosis not present

## 2020-05-11 ENCOUNTER — Telehealth: Payer: Self-pay | Admitting: Family Medicine

## 2020-05-11 DIAGNOSIS — R194 Change in bowel habit: Secondary | ICD-10-CM | POA: Diagnosis not present

## 2020-05-11 NOTE — Telephone Encounter (Signed)
Opened in error   Made pt appointment 10/28

## 2020-05-12 ENCOUNTER — Other Ambulatory Visit: Payer: Self-pay

## 2020-05-12 ENCOUNTER — Encounter: Payer: Self-pay | Admitting: Family Medicine

## 2020-05-12 ENCOUNTER — Ambulatory Visit (INDEPENDENT_AMBULATORY_CARE_PROVIDER_SITE_OTHER): Payer: Medicare Other | Admitting: Family Medicine

## 2020-05-12 VITALS — BP 120/64 | HR 80 | Temp 97.9°F | Ht 63.5 in | Wt 212.0 lb

## 2020-05-12 DIAGNOSIS — L989 Disorder of the skin and subcutaneous tissue, unspecified: Secondary | ICD-10-CM | POA: Insufficient documentation

## 2020-05-12 MED ORDER — TRIAMCINOLONE ACETONIDE 0.1 % EX CREA: 1.0000 "application " | TOPICAL_CREAM | Freq: Two times a day (BID) | CUTANEOUS | 0 refills | Status: DC

## 2020-05-12 NOTE — Progress Notes (Signed)
Chief Complaint  Patient presents with  . Sore on Face    History of Present Illness: HPI   74 year old female presents with new onset lesion on  Right lower cheek near edge of face. Noted first 3 weeks ago.. went away with OTC cream ( possibly neosporin), has come back in last few days.   Sore when stretching mouth with yawn, no pain or itching otherwise.  Area feels rough, dry.. no pustule, no vesicle.   No personal history of skin cancer.   This visit occurred during the SARS-CoV-2 public health emergency.  Safety protocols were in place, including screening questions prior to the visit, additional usage of staff PPE, and extensive cleaning of exam room while observing appropriate contact time as indicated for disinfecting solutions.   COVID 19 screen:  No recent travel or known exposure to COVID19 The patient denies respiratory symptoms of COVID 19 at this time. The importance of social distancing was discussed today.     Review of Systems  Constitutional: Negative for chills and fever.  HENT: Negative for congestion and ear pain.   Eyes: Negative for pain and redness.  Respiratory: Negative for cough and shortness of breath.   Cardiovascular: Negative for chest pain, palpitations and leg swelling.  Gastrointestinal: Negative for abdominal pain, blood in stool, constipation, diarrhea, nausea and vomiting.  Genitourinary: Negative for dysuria.  Musculoskeletal: Negative for falls and myalgias.  Skin: Negative for rash.  Neurological: Negative for dizziness.  Psychiatric/Behavioral: Negative for depression. The patient is not nervous/anxious.       Past Medical History:  Diagnosis Date  . Allergic rhinitis   . Allergy   . Arthritis   . Asthma   . Chronic headache   . Diabetes mellitus   . DVT (deep venous thrombosis) ([place])   . Dyspnea   . Heart murmur   . Hypertension   . Penicillin allergy 01/19/2020  . Pulmonary embolism ([place])   . Pulmonary hypertension ([place])    . Seizures ([place])     reports that she has never smoked. She has never used smokeless tobacco. She reports previous alcohol use. She reports that she does not use drugs.   Current Outpatient Medications:  .  acetaminophen (TYLENOL) 650 MG CR tablet, Take 1,300 mg by mouth daily as needed for pain., Disp: , Rfl:  .  ADVAIR HFA 846-96 MCG/ACT inhaler, INHALE 2 PUFFS BY MOUTH TWICE DAILY TO PREVENT COUGH OR WHEEZING, RINSE MOUTH AFTER USE, Disp: 12 g, Rfl: 4 .  albuterol (VENTOLIN HFA) 108 (90 Base) MCG/ACT inhaler, INHALE 2 PUFFS BY MOUTH INTO THE LUNGS EVERY 6 HOURS AS NEEDED FOR WHEEZING OR SHORTNESS OF BREATH, Disp: 54 g, Rfl: 0 .  antiseptic oral rinse (BIOTENE) LIQD, 15 mLs by Mouth Rinse route in the morning and at bedtime., Disp: , Rfl:  .  Apoaequorin (PREVAGEN) 10 MG CAPS, Take 10 mg by mouth daily., Disp: , Rfl:  .  Artificial Saliva (BIOTENE DRY MOUTH) LOZG, Use as directed 1 lozenge in the mouth or throat daily as needed (dry eyes)., Disp: , Rfl:  .  atorvastatin (LIPITOR) 10 MG tablet, Take 1 tablet (10 mg total) by mouth daily., Disp: 90 tablet, Rfl: 3 .  azelastine (ASTELIN) 0.1 % nasal spray, USE 1-2 SPRAYS IN EACH NOSTRIL TWICE DAILY AS NEEDED, Disp: 30 mL, Rfl: 5 .  Capsaicin-Menthol (SALONPAS GEL EX), Apply 1 application topically daily as needed (pain)., Disp: , Rfl:  .  Coenzyme Q10 (COQ-10) 100 MG  CAPS, Take 100 mg by mouth daily., Disp: , Rfl:  .  cyclobenzaprine (FLEXERIL) 10 MG tablet, TAKE 1 TABLET(10 MG) BY MOUTH AT BEDTIME AS NEEDED FOR MUSCLE SPASMS, Disp: 30 tablet, Rfl: 1 .  dextromethorphan-guaiFENesin (MUCINEX DM) 30-600 MG 12hr tablet, Take 1 tablet by mouth 2 (two) times daily as needed for cough., Disp: , Rfl:  .  Empagliflozin-linaGLIPtin (GLYXAMBI) 10-5 MG TABS, Take 1 tablet by mouth daily., Disp: 30 tablet, Rfl: 11 .  EPINEPHrine 0.3 mg/0.3 mL IJ SOAJ injection, USE AS DIRECTED FOR LIFE THREATENING ALLERGIC REACTIONS, Disp: 2 each, Rfl: 2 .  estradiol  (ESTRACE) 0.1 MG/GM vaginal cream, 0.5 g of cream intravaginally administered daily for 2 weeks, then reduce to twice weekly, Disp: 42.5 g, Rfl: 12 .  famotidine (PEPCID) 40 MG tablet, TAKE 1 TABLET(40 MG) BY MOUTH DAILY, Disp: 90 tablet, Rfl: 1 .  fluticasone (FLOVENT HFA) 220 MCG/ACT inhaler, INHALE 2 PUFFS BY MOUTH TWICE DAILY DURING FLARE UP AS NEEDED. RINSE, GARGLE AND SPIT AFTER USE, Disp: 12 g, Rfl: 1 .  furosemide (LASIX) 40 MG tablet, TAKE 1 TABLET(40 MG) BY MOUTH DAILY (Patient taking differently: Take 40 mg by mouth daily as needed for edema. ), Disp: 30 tablet, Rfl: 3 .  gabapentin (NEURONTIN) 600 MG tablet, TAKE 1 TABLET BY MOUTH DAILY AT BREAKFAST, 1 TABLET AT LUNCH, AND 2 TABLETS AT BEDTIME, Disp: 360 tablet, Rfl: 1 .  Garlic (GARLIQUE PO), Take 1 tablet by mouth daily., Disp: , Rfl:  .  Homeopathic Products [facility] RELIEF EX), Apply 1 spray topically daily as needed (muscle cramps)., Disp: , Rfl:  .  hydrOXYzine (ATARAX/VISTARIL) 10 MG tablet, Take 1 tablet (10 mg total) by mouth daily as needed for itching., Disp: 15 tablet, Rfl: 0 .  ipratropium (ATROVENT) 0.06 % nasal spray, Place 1-2 sprays each nostril 1-2 times per day (Patient taking differently: Place 2 sprays into both nostrils 2 (two) times daily as needed for rhinitis. ), Disp: 15 mL, Rfl: 5 .  irbesartan (AVAPRO) 150 MG tablet, TAKE 1 TABLET(150 MG) BY MOUTH DAILY, Disp: 30 tablet, Rfl: 9 .  Lactase (DAIRY-RELIEF PO), Take 1 capsule by mouth daily as needed (eating dairy). , Disp: , Rfl:  .  levalbuterol (XOPENEX) 1.25 MG/3ML nebulizer solution, USE 1 VIAL VIA NEBULIZER EVERY 6 HOURS AS NEEDED FOR WHEEZING OR SHORTNESS OF BREATH, Disp: 9 mL, Rfl: 1 .  meclizine (ANTIVERT) 12.5 MG tablet, Take 1 tablet (12.5 mg total) by mouth 3 (three) times daily as needed for dizziness., Disp: 15 tablet, Rfl: 0 .  Misc Natural Products (GLUCOSAMINE CHONDROITIN TRIPLE) TABS, Take 2 tablets by mouth daily., Disp: , Rfl:  .  montelukast  (SINGULAIR) 10 MG tablet, Take 1 tablet (10 mg total) by mouth daily., Disp: 90 tablet, Rfl: 1 .  nystatin (MYCOSTATIN) 100000 UNIT/ML suspension, SWISH AND SWALLOW 5 ML BY MOUTH TWICE DAILY AFTER ADVAIR, Disp: 60 mL, Rfl: 5 .  omalizumab (XOLAIR) 150 MG injection, Inject 300 mg into the skin every 28 (twenty-eight) days., Disp: , Rfl:  .  ondansetron (ZOFRAN-ODT) 4 MG disintegrating tablet, Take 4 mg by mouth every 8 (eight) hours as needed for nausea or vomiting. , Disp: , Rfl:  .  OneTouch Delica Lancets 16X MISC, Use to check blood sugar up to 2 times a day., Disp: 100 each, Rfl: 5 .  ONETOUCH VERIO test strip, Use to check blood sugar up to 2 times a day, Disp: 100 each, Rfl: 5 .  oxybutynin (  DITROPAN-XL) 5 MG 24 hr tablet, Take 5 mg by mouth daily., Disp: , Rfl:  .  pantoprazole (PROTONIX) 40 MG tablet, TAKE 1 TABLET(40 MG) BY MOUTH DAILY, Disp: 90 tablet, Rfl: 1 .  PATADAY 0.2 % SOLN, Place 1 drop into both eyes daily as needed (allergies). , Disp: , Rfl: 4 .  Potassium 99 MG TABS, Take 99 mg by mouth daily., Disp: , Rfl:  .  Probiotic Product (ALIGN) 4 MG CAPS, Take 4 mg by mouth daily. , Disp: , Rfl:  .  Psyllium (METAMUCIL FIBER PO), Take by mouth daily as needed., Disp: , Rfl:  .  Pumpkin Seed-Soy Germ (AZO BLADDER CONTROL/GO-LESS) CAPS, Take 1 tablet by mouth 2 (two) times daily., Disp: , Rfl:  .  Tiotropium Bromide Monohydrate (SPIRIVA RESPIMAT) 1.25 MCG/ACT AERS, Inhale 2 puffs into the lungs daily., Disp: 4 g, Rfl: 5 .  triamcinolone cream (KENALOG) 0.1 %, Apply 1 application topically 2 (two) times daily. (Patient taking differently: Apply 1 application topically 2 (two) times daily as needed (itching). ), Disp: 30 g, Rfl: 0 .  TRULANCE 3 MG TABS, Take 3 mg by mouth daily. , Disp: , Rfl:  .  TURMERIC PO, Take 1,000 mg by mouth daily., Disp: , Rfl:  .  VOLTAREN 1 % GEL, Apply 2 g topically 4 (four) times daily. (Patient taking differently: Apply 2 g topically 4 (four) times daily as  needed (pain). ), Disp: 100 g, Rfl: 2 .  XARELTO 20 MG TABS tablet, TAKE 1 TABLET(20 MG) BY MOUTH DAILY WITH SUPPER, Disp: 90 tablet, Rfl: 1  Current Facility-Administered Medications:  .  omalizumab Arvid Right) injection 300 mg, 300 mg, Subcutaneous, Q28 days, Kozlow, Donnamarie Poag, MD, 300 mg at 05/10/20 0904   Observations/Objective: Blood pressure 120/64, pulse 80, temperature 97.9 F (36.6 C), temperature source Temporal, height 5' 3.5" (1.613 m), weight 212 lb (96.2 kg), SpO2 96 %.  Physical Exam Constitutional:      General: She is not in acute distress.    Appearance: Normal appearance. She is well-developed. She is not ill-appearing or toxic-appearing.  HENT:     Head: Normocephalic.     Right Ear: Hearing, tympanic membrane, ear canal and external ear normal. Tympanic membrane is not erythematous, retracted or bulging.     Left Ear: Hearing, tympanic membrane, ear canal and external ear normal. Tympanic membrane is not erythematous, retracted or bulging.     Nose: No mucosal edema or rhinorrhea.     Right Sinus: No maxillary sinus tenderness or frontal sinus tenderness.     Left Sinus: No maxillary sinus tenderness or frontal sinus tenderness.     Mouth/Throat:     Pharynx: Uvula midline.  Eyes:     General: Lids are normal. Lids are everted, no foreign bodies appreciated.     Conjunctiva/sclera: Conjunctivae normal.     Pupils: Pupils are equal, round, and reactive to light.  Neck:     Thyroid: No thyroid mass or thyromegaly.     Vascular: No carotid bruit.     Trachea: Trachea normal.  Cardiovascular:     Rate and Rhythm: Normal rate and regular rhythm.     Pulses: Normal pulses.     Heart sounds: Normal heart sounds, S1 normal and S2 normal. No murmur heard.  No friction rub. No gallop.   Pulmonary:     Effort: Pulmonary effort is normal. No tachypnea or respiratory distress.     Breath sounds: Normal breath sounds. No decreased breath  sounds, wheezing, rhonchi or rales.   Abdominal:     General: Bowel sounds are normal.     Palpations: Abdomen is soft.     Tenderness: There is no abdominal tenderness.  Musculoskeletal:     Cervical back: Normal range of motion and neck supple.    Skin:    General: Skin is warm and dry.     Findings: No rash.     Comments:  Small flaky area on right  Lower cheek nail mouth edge, erythema back ground.. see picture  Neurological:     Mental Status: She is alert.  Psychiatric:        Mood and Affect: Mood is not anxious or depressed.        Speech: Speech normal.        Behavior: Behavior normal. Behavior is cooperative.        Thought Content: Thought content normal.        Judgment: Judgment normal.      Assessment and Plan   Skin lesion of face  Eczema vs early skin cancer change vs actinic keratosis... treat with topical steroid BID.Marland Kitchen if not improving plan derm referral.     [name], MD

## 2020-05-12 NOTE — Patient Instructions (Signed)
Apply topical steroid small amount twice daily on skin lesion.  Call if not resolving or if it returns for a dermatology referral.

## 2020-05-12 NOTE — Assessment & Plan Note (Signed)
Eczema vs early skin cancer change vs actinic keratosis... treat with topical steroid BID.Marland Kitchen if not improving plan derm referral.

## 2020-05-13 ENCOUNTER — Encounter: Payer: Medicare Other | Attending: Family Medicine | Admitting: Registered"

## 2020-05-16 ENCOUNTER — Other Ambulatory Visit: Payer: Self-pay | Admitting: Cardiovascular Disease

## 2020-05-18 ENCOUNTER — Telehealth: Payer: Self-pay | Admitting: Family Medicine

## 2020-05-18 NOTE — Telephone Encounter (Addendum)
Spoke with [name] and advised that we just need to do a PA for the [facility].  The one from last year expired 04/2020.

## 2020-05-18 NOTE — Telephone Encounter (Signed)
PA for Glyxambi 10-5 mg completed by telephone and approved through 05/18/2021.  Left message for Ms. [name] that PA was approved x 1 year.

## 2020-05-18 NOTE — Telephone Encounter (Signed)
Pt would like a call back from donna  [facility] in [place] told pt to call office regarding glyxambi   The insurance wanted her to do metformin  She would like to discuss this

## 2020-05-26 ENCOUNTER — Other Ambulatory Visit: Payer: Self-pay

## 2020-05-26 ENCOUNTER — Ambulatory Visit (INDEPENDENT_AMBULATORY_CARE_PROVIDER_SITE_OTHER): Payer: Medicare Other | Admitting: Cardiovascular Disease

## 2020-05-26 VITALS — BP 130/70 | HR 63 | Ht 63.5 in | Wt 209.2 lb

## 2020-05-26 DIAGNOSIS — I2721 Secondary pulmonary arterial hypertension: Secondary | ICD-10-CM

## 2020-05-26 DIAGNOSIS — Z86711 Personal history of pulmonary embolism: Secondary | ICD-10-CM

## 2020-05-26 DIAGNOSIS — E1140 Type 2 diabetes mellitus with diabetic neuropathy, unspecified: Secondary | ICD-10-CM

## 2020-05-26 DIAGNOSIS — I10 Essential (primary) hypertension: Secondary | ICD-10-CM | POA: Diagnosis not present

## 2020-05-26 DIAGNOSIS — I480 Paroxysmal atrial fibrillation: Secondary | ICD-10-CM

## 2020-05-26 DIAGNOSIS — Z7901 Long term (current) use of anticoagulants: Secondary | ICD-10-CM

## 2020-05-26 DIAGNOSIS — E7800 Pure hypercholesterolemia, unspecified: Secondary | ICD-10-CM | POA: Diagnosis not present

## 2020-05-26 NOTE — Progress Notes (Signed)
Cardiology Office Note    Date:  05/26/2020   ID:  Alicia, Price [birth year], MRN [id number]  PCP:  [name], MD  Cardiologist:   Sanda Klein, MD   Chief complaint: Atrial fibrillation follow-up   History of Present Illness:  [name] is a 74 y.o. female with paroxysmal atrial fibrillation, remote DVT/PE, moderate pulmonary hypertension, chronic reactive airway disease and degenerative joint disease returning for follow-up.  She has had a good year.  She is focused a lot on improving her diet and has lost almost 50 pounds from her peak weight.  She still has a BMI in the severely obese range.  Glucose control is excellent with a hemoglobin A1c that has oscillated between 5.9 and 6.2%.  Her most recent lipid profile was excellent with an LDL cholesterol 56.  Her blood pressure is very well controlled.  The patient specifically denies any chest pain at rest exertion, dyspnea at rest or with exertion, orthopnea, paroxysmal nocturnal dyspnea, syncope, palpitations, focal neurological deficits, intermittent claudication, lower extremity edema, unexplained weight gain, cough, hemoptysis or wheezing.  As before, her activity is limited by back pain more than any cardiovascular complaints.  She has not required any furosemide in a long time.  Her ECG shows normal sinus rhythm.  Her last documented episode of atrial fibrillation was about 4 to 5 years ago.  She is compliant with Xarelto and has not had any bleeding problems.  Previous coronary angiography showed no evidence of CAD but she did have moderate pulmonary artery hypertension by right heart catheterization (systolic PAP 50 mmHg).  She has had a variety of noncardiac issues including recurrent issues with diarrhea (she is seeing Dr. Cristina Gong) and continued hyperthyroidism controlled on methimazole (she is seeing Dr. Cruzita Lederer).    She continues to have issues with thoracic spine related pain (Dr. Gladstone Lighter). She has had a  previous equivocal workup for hypercoagulable conditions (lupus anticoagulant positive on one of 2 separate assays, protein S activity decreased with normal total protein S level).    Past Medical History:  Diagnosis Date  . Allergic rhinitis   . Allergy   . Arthritis   . Asthma   . Chronic headache   . Diabetes mellitus   . DVT (deep venous thrombosis) ([place])   . Dyspnea   . Heart murmur   . Hypertension   . Penicillin allergy 01/19/2020  . Pulmonary embolism ([place])   . Pulmonary hypertension ([place])   . Seizures ([place])     Past Surgical History:  Procedure Laterality Date  . ABDOMINAL HYSTERECTOMY     partial, has ovaries  . CARDIAC CATHETERIZATION  12/28/2010   Mod. pulmonary hypertension, normal coronary arteries  . DOPPLER ECHOCARDIOGRAPHY  10/08/2011   EF=>55%,mild asymmetric LVH, mod. TR, mod. PH, mild to mod LA dilatation  . IR LUMBAR DISC ASPIRATION W/IMG GUIDE  01/12/2020  . KNEE ARTHROSCOPY Left   . KNEE SURGERY    . Nuclear Stress Test  05/20/2006   No ischemia  . PARTIAL HYSTERECTOMY    . PLANTAR FASCIA SURGERY    . TONSILLECTOMY      Current Medications: Outpatient Medications Prior to Visit  Medication Sig Dispense Refill  . acetaminophen (TYLENOL) 650 MG CR tablet Take 1,300 mg by mouth daily as needed for pain.    Marland Kitchen ADVAIR HFA 230-21 MCG/ACT inhaler INHALE 2 PUFFS BY MOUTH TWICE DAILY TO PREVENT COUGH OR WHEEZING, RINSE MOUTH AFTER USE 12 g 4  . albuterol (  VENTOLIN HFA) 108 (90 Base) MCG/ACT inhaler INHALE 2 PUFFS BY MOUTH INTO THE LUNGS EVERY 6 HOURS AS NEEDED FOR WHEEZING OR SHORTNESS OF BREATH 54 g 0  . antiseptic oral rinse (BIOTENE) LIQD 15 mLs by Mouth Rinse route in the morning and at bedtime.    Marland Kitchen Apoaequorin (PREVAGEN) 10 MG CAPS Take 10 mg by mouth daily.    . Artificial Saliva (BIOTENE DRY MOUTH) LOZG Use as directed 1 lozenge in the mouth or throat daily as needed (dry eyes).    Marland Kitchen atorvastatin (LIPITOR) 10 MG tablet Take 1 tablet (10 mg total) by  mouth daily. 90 tablet 3  . azelastine (ASTELIN) 0.1 % nasal spray USE 1-2 SPRAYS IN EACH NOSTRIL TWICE DAILY AS NEEDED 30 mL 5  . Capsaicin-Menthol (SALONPAS GEL EX) Apply 1 application topically daily as needed (pain).    . Coenzyme Q10 (COQ-10) 100 MG CAPS Take 100 mg by mouth daily.    . cyclobenzaprine (FLEXERIL) 10 MG tablet TAKE 1 TABLET(10 MG) BY MOUTH AT BEDTIME AS NEEDED FOR MUSCLE SPASMS 30 tablet 1  . dextromethorphan-guaiFENesin (MUCINEX DM) 30-600 MG 12hr tablet Take 1 tablet by mouth 2 (two) times daily as needed for cough.    . Empagliflozin-linaGLIPtin (GLYXAMBI) 10-5 MG TABS Take 1 tablet by mouth daily. 30 tablet 11  . EPINEPHrine 0.3 mg/0.3 mL IJ SOAJ injection USE AS DIRECTED FOR LIFE THREATENING ALLERGIC REACTIONS 2 each 2  . estradiol (ESTRACE) 0.1 MG/GM vaginal cream 0.5 g of cream intravaginally administered daily for 2 weeks, then reduce to twice weekly 42.5 g 12  . fluticasone (FLOVENT HFA) 220 MCG/ACT inhaler INHALE 2 PUFFS BY MOUTH TWICE DAILY DURING FLARE UP AS NEEDED. RINSE, GARGLE AND SPIT AFTER USE 12 g 1  . furosemide (LASIX) 40 MG tablet TAKE 1 TABLET(40 MG) BY MOUTH DAILY (Patient taking differently: Take 40 mg by mouth daily as needed for edema. ) 30 tablet 3  . gabapentin (NEURONTIN) 600 MG tablet TAKE 1 TABLET BY MOUTH DAILY AT BREAKFAST, 1 TABLET AT LUNCH, AND 2 TABLETS AT BEDTIME 360 tablet 1  . Garlic (GARLIQUE PO) Take 1 tablet by mouth daily.    . Homeopathic Products [facility] RELIEF EX) Apply 1 spray topically daily as needed (muscle cramps).    . hydrOXYzine (ATARAX/VISTARIL) 10 MG tablet Take 1 tablet (10 mg total) by mouth daily as needed for itching. 15 tablet 0  . ipratropium (ATROVENT) 0.06 % nasal spray Place 1-2 sprays each nostril 1-2 times per day (Patient taking differently: Place 2 sprays into both nostrils 2 (two) times daily as needed for rhinitis. ) 15 mL 5  . irbesartan (AVAPRO) 150 MG tablet TAKE 1 TABLET(150 MG) BY MOUTH DAILY 30 tablet  2  . Lactase (DAIRY-RELIEF PO) Take 1 capsule by mouth daily as needed (eating dairy).     Marland Kitchen levalbuterol (XOPENEX) 1.25 MG/3ML nebulizer solution USE 1 VIAL VIA NEBULIZER EVERY 6 HOURS AS NEEDED FOR WHEEZING OR SHORTNESS OF BREATH 9 mL 1  . meclizine (ANTIVERT) 12.5 MG tablet Take 1 tablet (12.5 mg total) by mouth 3 (three) times daily as needed for dizziness. 15 tablet 0  . Misc Natural Products (GLUCOSAMINE CHONDROITIN TRIPLE) TABS Take 2 tablets by mouth daily.    . montelukast (SINGULAIR) 10 MG tablet Take 1 tablet (10 mg total) by mouth daily. 90 tablet 1  . nystatin (MYCOSTATIN) 100000 UNIT/ML suspension SWISH AND SWALLOW 5 ML BY MOUTH TWICE DAILY AFTER ADVAIR 60 mL 5  . omalizumab (  XOLAIR) 150 MG injection Inject 300 mg into the skin every 28 (twenty-eight) days.    . ondansetron (ZOFRAN-ODT) 4 MG disintegrating tablet Take 4 mg by mouth every 8 (eight) hours as needed for nausea or vomiting.     [name] Delica Lancets 18E MISC Use to check blood sugar up to 2 times a day. 100 each 5  . ONETOUCH VERIO test strip Use to check blood sugar up to 2 times a day 100 each 5  . oxybutynin (DITROPAN-XL) 5 MG 24 hr tablet Take 5 mg by mouth daily.    . pantoprazole (PROTONIX) 40 MG tablet TAKE 1 TABLET(40 MG) BY MOUTH DAILY 90 tablet 1  . PATADAY 0.2 % SOLN Place 1 drop into both eyes daily as needed (allergies).   4  . Potassium 99 MG TABS Take 99 mg by mouth daily.    . Probiotic Product (ALIGN) 4 MG CAPS Take 4 mg by mouth daily.     . Psyllium (METAMUCIL FIBER PO) Take by mouth daily as needed.    . Pumpkin Seed-Soy Germ (AZO BLADDER CONTROL/GO-LESS) CAPS Take 1 tablet by mouth 2 (two) times daily.    . Tiotropium Bromide Monohydrate (SPIRIVA RESPIMAT) 1.25 MCG/ACT AERS Inhale 2 puffs into the lungs daily. 4 g 5  . triamcinolone cream (KENALOG) 0.1 % Apply 1 application topically 2 (two) times daily. (Patient taking differently: Apply 1 application topically 2 (two) times daily. Pt takes as  needed) 15 g 0  . TRULANCE 3 MG TABS Take 3 mg by mouth daily.     . TURMERIC PO Take 1,000 mg by mouth daily.    . VOLTAREN 1 % GEL Apply 2 g topically 4 (four) times daily. (Patient taking differently: Apply 2 g topically 4 (four) times daily as needed (pain). ) 100 g 2  . XARELTO 20 MG TABS tablet TAKE 1 TABLET(20 MG) BY MOUTH DAILY WITH SUPPER 90 tablet 1  . famotidine (PEPCID) 40 MG tablet TAKE 1 TABLET(40 MG) BY MOUTH DAILY (Patient not taking: Reported on 05/26/2020) 90 tablet 1   Facility-Administered Medications Prior to Visit  Medication Dose Route Frequency Provider Last Rate Last Admin  . omalizumab Arvid Right) injection 300 mg  300 mg Subcutaneous Q28 days [name], MD   300 mg at 05/10/20 9937     Allergies:   Latex, Penicillins, Lyrica [pregabalin], Nickel, Phenytoin sodium extended, and Vicodin [hydrocodone-acetaminophen]   Social History   Socioeconomic History  . Marital status: Widowed    Spouse name: Not on file  . Number of children: Y  . Years of education: Not on file  . Highest education level: Not on file  Occupational History  . Occupation: retired Geologist, engineering.   Tobacco Use  . Smoking status: Never Smoker  . Smokeless tobacco: Never Used  Vaping Use  . Vaping Use: Never used  Substance and Sexual Activity  . Alcohol use: Not Currently    Alcohol/week: 0.0 standard drinks    Comment: occ glass on wine  . Drug use: No  . Sexual activity: Never  Other Topics Concern  . Not on file  Social History Narrative   Widow    limited exercise.   Social Determinants of Health   Financial Resource Strain: Low Risk   . Difficulty of Paying Living Expenses: Not hard at all  Food Insecurity:   . Worried About Charity fundraiser in the Last Year: Not on file  . Ran Out of Food in  the Last Year: Not on file  Transportation Needs:   . Lack of Transportation (Medical): Not on file  . Lack of Transportation (Non-Medical): Not on file  Physical Activity:    . Days of Exercise per Week: Not on file  . Minutes of Exercise per Session: Not on file  Stress:   . Feeling of Stress : Not on file  Social Connections:   . Frequency of Communication with Friends and Family: Not on file  . Frequency of Social Gatherings with Friends and Family: Not on file  . Attends Religious Services: Not on file  . Active Member of Clubs or Organizations: Not on file  . Attends Archivist Meetings: Not on file  . Marital Status: Not on file     Family History:  The patient's family history includes Alcohol abuse in her father; Allergies in an other family member; Arthritis in her mother and sister; Breast cancer in her mother; Cancer in her brother; Clotting disorder in her mother; Diabetes in her mother and sister; Hypertension in her mother; Multiple sclerosis in her sister; Stroke in her mother.   ROS:   Please see the history of present illness.    ROS All other systems reviewed and are negative.   PHYSICAL EXAM:   VS:  BP 130/70 (BP Location: Left Arm, Patient Position: Sitting)   Pulse 63   Ht 5' 3.5" (1.613 m)   Wt 209 lb 3.2 oz (94.9 kg)   SpO2 98%   BMI 36.48 kg/m       General: Alert, oriented x3, no distress, severely obese Head: no evidence of trauma, PERRL, EOMI, no exophtalmos or lid lag, no myxedema, no xanthelasma; normal ears, nose and oropharynx Neck: normal jugular venous pulsations and no hepatojugular reflux; brisk carotid pulses without delay and no carotid bruits Chest: clear to auscultation, no signs of consolidation by percussion or palpation, normal fremitus, symmetrical and full respiratory excursions Cardiovascular: normal position and quality of the apical impulse, regular rhythm, normal first and second heart sounds, no murmurs, rubs or gallops Abdomen: no tenderness or distention, no masses by palpation, no abnormal pulsatility or arterial bruits, normal bowel sounds, no hepatosplenomegaly Extremities: no clubbing,  cyanosis or edema; 2+ radial, ulnar and brachial pulses bilaterally; 2+ right femoral, posterior tibial and dorsalis pedis pulses; 2+ left femoral, posterior tibial and dorsalis pedis pulses; no subclavian or femoral bruits Neurological: grossly nonfocal Psych: Normal mood and affect    Wt Readings from Last 3 Encounters:  05/26/20 209 lb 3.2 oz (94.9 kg)  05/12/20 212 lb (96.2 kg)  04/26/20 211 lb 9.6 oz (96 kg)      Studies/Labs Reviewed:   EKG:  EKG is ordered today.  It shows normal sinus rhythm, completely normal tracing  Recent Labs: 01/04/2020: Hemoglobin 12.2; Platelets 344 04/20/2020: ALT 7; BUN 10; Creatinine, Ser 0.91; Potassium 3.6; Sodium 141 04/26/2020: TSH 3.82   Lipid Panel    Component Value Date/Time   CHOL 117 04/20/2020 1131   TRIG 71.0 04/20/2020 1131   HDL 46.60 04/20/2020 1131   CHOLHDL 3 04/20/2020 1131   VLDL 14.2 04/20/2020 1131   LDLCALC 56 04/20/2020 1131     ASSESSMENT:    1. Paroxysmal atrial fibrillation (HCC)   2. Essential hypertension   3. History of pulmonary embolism   4. PAH (pulmonary artery hypertension) ([place])   5. Type 2 diabetes mellitus with diabetic neuropathy, without long-term current use of insulin ([place])   6. Hypercholesterolemia  7. Severe obesity (BMI 35.0-39.9) with comorbidity ([place])   8. Long term current use of anticoagulant      PLAN:  In order of problems listed above:  1. AFib: Has been over 4 years since her last clinically evident episodes of atrial fibrillation. CHADSVasc 4 (age, gender, HTN, DM).  We reviewed the relationship between weight and A. fib burden, hopefully her continued efforts at weight loss will be referred. 2. HTN: Well-controlled. 3. Hx DVT/PE: In resulting previous hypercoagulable work-up.  Will be on lifelong anticoagulation for atrial fibrillation probably anyway. 4. PAH: Currently asymptomatic.  Etiology unclear, suspect contribution of obesity, previous thromboembolic events and  possibly some diastolic left heart dysfunction. 5. DM: Excellent control, recent hemoglobin A1c 6.2%. 6. HLP: All lipid parameters are excellent on recent assay. 7. Obesity: Making great progress with SGLT2 inhibitors, but remains in the severely obese range. 8. Xarelto: Indicated both for atrial fibrillation and history of venous thromboembolism.  No bleeding complications.   Medication Adjustments/Labs and Tests Ordered: Current medicines are reviewed at length with the patient today.  Concerns regarding medicines are outlined above.  Medication changes, Labs and Tests ordered today are listed in the Patient Instructions below: Patient Instructions  Medication Instructions:  No changes *If you need a refill on your cardiac medications before your next appointment, please call your pharmacy*   Lab Work: None ordered If you have labs (blood work) drawn today and your tests are completely normal, you will receive your results only by: Marland Kitchen MyChart Message (if you have MyChart) OR . A paper copy in the mail If you have any lab test that is abnormal or we need to change your treatment, we will call you to review the results.   Testing/Procedures: None ordered   Follow-Up: At [facility], you and your health needs are our priority.  As part of our continuing mission to provide you with exceptional heart care, we have created designated Provider Care Teams.  These Care Teams include your primary Cardiologist (physician) and Advanced Practice Providers (APPs -  Physician Assistants and Nurse Practitioners) who all work together to provide you with the care you need, when you need it.  We recommend signing up for the patient portal called "MyChart".  Sign up information is provided on this After Visit Summary.  MyChart is used to connect with patients for Virtual Visits (Telemedicine).  Patients are able to view lab/test results, encounter notes, upcoming appointments, etc.  Non-urgent  messages can be sent to your provider as well.   To learn more about what you can do with MyChart, go to [email or web address].    Your next appointment:   12 month(s)  The format for your next appointment:   In Person  Provider:   Sanda Klein, MD      Signed, Sanda Klein, MD  05/26/2020 11:36 AM    [address], [place], [place]  [address] Phone: [phone]; Fax: [phone]

## 2020-05-26 NOTE — Patient Instructions (Signed)
Medication Instructions:  No changes *If you need a refill on your cardiac medications before your next appointment, please call your pharmacy*   Lab Work: None ordered If you have labs (blood work) drawn today and your tests are completely normal, you will receive your results only by: . MyChart Message (if you have MyChart) OR . A paper copy in the mail If you have any lab test that is abnormal or we need to change your treatment, we will call you to review the results.   Testing/Procedures: None ordered   Follow-Up: At [facility], you and your health needs are our priority.  As part of our continuing mission to provide you with exceptional heart care, we have created designated Provider Care Teams.  These Care Teams include your primary Cardiologist (physician) and Advanced Practice Providers (APPs -  Physician Assistants and Nurse Practitioners) who all work together to provide you with the care you need, when you need it.  We recommend signing up for the patient portal called "MyChart".  Sign up information is provided on this After Visit Summary.  MyChart is used to connect with patients for Virtual Visits (Telemedicine).  Patients are able to view lab/test results, encounter notes, upcoming appointments, etc.  Non-urgent messages can be sent to your provider as well.   To learn more about what you can do with MyChart, go to [email or web address].    Your next appointment:   12 month(s)  The format for your next appointment:   In Person  Provider:   Mihai Croitoru, MD   

## 2020-05-28 ENCOUNTER — Other Ambulatory Visit: Payer: Self-pay | Admitting: Allergy and Immunology

## 2020-05-30 ENCOUNTER — Other Ambulatory Visit: Payer: Self-pay | Admitting: Allergy & Immunology

## 2020-05-30 ENCOUNTER — Other Ambulatory Visit: Payer: Self-pay | Admitting: Allergy and Immunology

## 2020-05-30 ENCOUNTER — Other Ambulatory Visit: Payer: Self-pay

## 2020-05-30 NOTE — Telephone Encounter (Signed)
Courtesy refill of pepcid 40 mg sent in. Pt. Was called and she stated she has an appointment with Dr. Neldon Mc tomorrow in which I confirmed with pt.

## 2020-05-30 NOTE — Telephone Encounter (Signed)
Courtesy refill only. No more refills until pt. Is seen.

## 2020-05-31 ENCOUNTER — Ambulatory Visit (INDEPENDENT_AMBULATORY_CARE_PROVIDER_SITE_OTHER): Payer: Medicare Other | Admitting: Allergy and Immunology

## 2020-05-31 ENCOUNTER — Other Ambulatory Visit: Payer: Self-pay

## 2020-05-31 VITALS — BP 118/80 | HR 66 | Temp 98.0°F | Resp 14 | Ht 64.0 in | Wt 213.8 lb

## 2020-05-31 DIAGNOSIS — K219 Gastro-esophageal reflux disease without esophagitis: Secondary | ICD-10-CM

## 2020-05-31 DIAGNOSIS — B370 Candidal stomatitis: Secondary | ICD-10-CM

## 2020-05-31 DIAGNOSIS — J4550 Severe persistent asthma, uncomplicated: Secondary | ICD-10-CM

## 2020-05-31 DIAGNOSIS — J3089 Other allergic rhinitis: Secondary | ICD-10-CM

## 2020-05-31 NOTE — Patient Instructions (Addendum)
  1. Continue Advair 230 - 2 inhalations twice a day  2.  Continue nystatin 5 mL swish and swallow after Advair 2 times a day  3. Continue Spiriva 2.5 respimat - two inhalations one time per day.  4. Continue Montelukast 10mg  - one tablet one time per day.  5. Continue Xolair and Epi-Pen  6. Continue pantoprazole 40mg  in the AM and Famotidine 40 mg in the PM  7. If needed:   A. Nasal saline several times per day  B. Mucinex DM two times per day  C. Ipratropium 0.06% -1-2 sprays each nostril 1-2 times per day to dry nose  D. Proventil HFA or albuterol nebulization  8. Add Flovent 220 - 2 inhalations 2 times a day during "FLARE UP"  9. Stop nasal azelastine spray  10. Return to clinic in 6 months or earlier if there is a problem.

## 2020-05-31 NOTE — Progress Notes (Signed)
[facility] [place] [facility] [place]   Follow-up Note   Referring Provider: [name], MD Primary Provider: [name], MD Date of Office Visit: 05/31/2020  Subjective:   [name] (DOB: [birth year]) is a 74 y.o. female who returns to the [facility] on 05/31/2020 in re-evaluation of the following:  HPI: [name] returns to this clinic in evaluation of asthma and allergic rhinitis and LPR and history of pulmonary hypertension diastolic dysfunction and a history of sleep apnea.  I last saw her in this clinic on 24 Nov 2019.  She has done very well with her airway and has not required a systemic steroid or antibiotic for any type of airway issue and rarely uses a short acting bronchodilator.  She does not really exercise to any degree at this point in time.  She has been having some problems with the nasal azelastine causing irritation of her nose and she recently discontinued that agent.  The issue with her recurrent sore throats continues to be under very good control while using nystatin after she uses her combination inhaler.  Her reflux is under very good control at this point in time on her current therapy.  She has been having some issues with her bowels and she is visiting with her gastroenterologist regarding this issue.  She has received 2 Covid vaccinations.  Allergies as of 05/31/2020      Reactions   Latex Hives   Penicillins Swelling   Has patient had a PCN reaction causing immediate rash, facial/tongue/throat swelling, SOB or lightheadedness with hypotension patient had a PCN reaction causing severe rash involving mucus membranes or skin necrosis: [id number] Has patient had a PCN reaction that required hospitalization/No Has patient had a PCN reaction occurring within the last 10 years: No If all of the above answers are "NO", then may proceed with Cephalosporin use.   Lyrica [pregabalin] Other (See Comments)   Blurred  vision.   Nickel Rash   Phenytoin Sodium Extended Swelling   Vicodin [hydrocodone-acetaminophen] Nausea And Vomiting      Medication List      acetaminophen 650 MG CR tablet Commonly known as: TYLENOL Take 1,300 mg by mouth daily as needed for pain.   Advair HFA 230-21 MCG/ACT inhaler Generic drug: fluticasone-salmeterol INHALE 2 PUFFS BY MOUTH TWICE DAILY TO PREVENT COUGH OR WHEEZING, RINSE MOUTH AFTER USE   albuterol 108 (90 Base) MCG/ACT inhaler Commonly known as: VENTOLIN HFA INHALE 2 PUFFS BY MOUTH INTO THE LUNGS EVERY 6 HOURS AS NEEDED FOR WHEEZING OR SHORTNESS OF BREATH   Align 4 MG Caps Take 4 mg by mouth daily.   antiseptic oral rinse Liqd 15 mLs by Mouth Rinse route in the morning and at bedtime.   atorvastatin 10 MG tablet Commonly known as: LIPITOR Take 1 tablet (10 mg total) by mouth daily.   azelastine 0.1 % nasal spray Commonly known as: Astelin USE 1-2 SPRAYS IN EACH NOSTRIL TWICE DAILY AS NEEDED   AZO Bladder Control/Go-Less Caps Take 1 tablet by mouth 2 (two) times daily.   Biotene Dry Mouth Lozg Use as directed 1 lozenge in the mouth or throat daily as needed (dry eyes).   CoQ-10 100 MG Caps Take 100 mg by mouth daily.   cyclobenzaprine 10 MG tablet Commonly known as: FLEXERIL TAKE 1 TABLET(10 MG) BY MOUTH AT BEDTIME AS NEEDED FOR MUSCLE SPASMS   DAIRY-RELIEF PO Take 1 capsule by mouth daily as needed (eating dairy).  dextromethorphan-guaiFENesin 30-600 MG 12hr tablet Commonly known as: MUCINEX DM Take 1 tablet by mouth 2 (two) times daily as needed for cough.   EPINEPHrine 0.3 mg/0.3 mL Soaj injection Commonly known as: EPI-PEN USE AS DIRECTED FOR LIFE THREATENING ALLERGIC REACTIONS   estradiol 0.1 MG/GM vaginal cream Commonly known as: ESTRACE 0.5 g of cream intravaginally administered daily for 2 weeks, then reduce to twice weekly   famotidine 40 MG tablet Commonly known as: PEPCID TAKE 1 TABLET(40 MG) BY MOUTH DAILY   Flovent  HFA 220 MCG/ACT inhaler Generic drug: fluticasone INHALE 2 PUFFS BY MOUTH TWICE DAILY DURING FLARE UP AS NEEDED. RINSE, GARGLE AND SPIT AFTER USE   furosemide 40 MG tablet Commonly known as: LASIX TAKE 1 TABLET(40 MG) BY MOUTH DAILY   gabapentin 600 MG tablet Commonly known as: NEURONTIN TAKE 1 TABLET BY MOUTH DAILY AT BREAKFAST, 1 TABLET AT LUNCH, AND 2 TABLETS AT BEDTIME   GARLIQUE PO Take 1 tablet by mouth daily.   Glucosamine Chondroitin Triple Tabs Take 2 tablets by mouth daily.   Glyxambi 10-5 MG Tabs Generic drug: Empagliflozin-linaGLIPtin Take 1 tablet by mouth daily.   hydrOXYzine 10 MG tablet Commonly known as: ATARAX/VISTARIL Take 1 tablet (10 mg total) by mouth daily as needed for itching.   ipratropium 0.06 % nasal spray Commonly known as: ATROVENT Place 1-2 sprays each nostril 1-2 times per day   irbesartan 150 MG tablet Commonly known as: AVAPRO TAKE 1 TABLET(150 MG) BY MOUTH DAILY   levalbuterol 1.25 MG/3ML nebulizer solution Commonly known as: XOPENEX USE 1 VIAL VIA NEBULIZER EVERY 6 HOURS AS NEEDED FOR WHEEZING OR SHORTNESS OF BREATH   meclizine 12.5 MG tablet Commonly known as: ANTIVERT Take 1 tablet (12.5 mg total) by mouth 3 (three) times daily as needed for dizziness.   METAMUCIL FIBER PO Take by mouth daily as needed.   montelukast 10 MG tablet Commonly known as: SINGULAIR Take 1 tablet (10 mg total) by mouth daily.   nystatin 100000 UNIT/ML suspension Commonly known as: MYCOSTATIN SWISH AND SWALLOW 5 ML BY MOUTH TWICE DAILY AFTER ADVAIR   omalizumab 150 MG injection Commonly known as: XOLAIR Inject 300 mg into the skin every 28 (twenty-eight) days.   ondansetron 4 MG disintegrating tablet Commonly known as: ZOFRAN-ODT Take 4 mg by mouth every 8 (eight) hours as needed for nausea or vomiting.   OneTouch Delica Lancets 97W Misc Use to check blood sugar up to 2 times a day.   OneTouch Verio test strip Generic drug: glucose blood  Use to check blood sugar up to 2 times a day   oxybutynin 5 MG 24 hr tablet Commonly known as: DITROPAN-XL Take 5 mg by mouth daily.   pantoprazole 40 MG tablet Commonly known as: PROTONIX TAKE 1 TABLET(40 MG) BY MOUTH DAILY   Pataday 0.2 % Soln Generic drug: Olopatadine HCl Place 1 drop into both eyes daily as needed (allergies).   Potassium 99 MG Tabs Take 99 mg by mouth daily.   Prevagen 10 MG Caps Generic drug: Apoaequorin Take 10 mg by mouth daily.   SALONPAS GEL EX Apply 1 application topically daily as needed (pain).   Spiriva Respimat 1.25 MCG/ACT Aers Generic drug: Tiotropium Bromide Monohydrate Inhale 2 puffs into the lungs daily.   YOVZCHYIF RELIEF EX Apply 1 spray topically daily as needed (muscle cramps).   triamcinolone 0.1 % Commonly known as: KENALOG Apply 1 application topically 2 (two) times daily.   Trulance 3 MG Tabs Generic drug: Plecanatide  Take 3 mg by mouth daily.   TURMERIC PO Take 1,000 mg by mouth daily.   Voltaren 1 % Gel Generic drug: diclofenac Sodium Apply 2 g topically 4 (four) times daily.   Xarelto 20 MG Tabs tablet Generic drug: rivaroxaban TAKE 1 TABLET(20 MG) BY MOUTH DAILY WITH SUPPER       Past Medical History:  Diagnosis Date  . Allergic rhinitis   . Allergy   . Arthritis   . Asthma   . Chronic headache   . Diabetes mellitus   . DVT (deep venous thrombosis) ([place])   . Dyspnea   . Heart murmur   . Hypertension   . Penicillin allergy 01/19/2020  . Pulmonary embolism ([place])   . Pulmonary hypertension ([place])   . Seizures ([place])     Past Surgical History:  Procedure Laterality Date  . ABDOMINAL HYSTERECTOMY     partial, has ovaries  . CARDIAC CATHETERIZATION  12/28/2010   Mod. pulmonary hypertension, normal coronary arteries  . DOPPLER ECHOCARDIOGRAPHY  10/08/2011   EF=>55%,mild asymmetric LVH, mod. TR, mod. PH, mild to mod LA dilatation  . IR LUMBAR DISC ASPIRATION W/IMG GUIDE  01/12/2020  . KNEE ARTHROSCOPY  Left   . KNEE SURGERY    . Nuclear Stress Test  05/20/2006   No ischemia  . PARTIAL HYSTERECTOMY    . PLANTAR FASCIA SURGERY    . TONSILLECTOMY      Review of systems negative except as noted in HPI / PMHx or noted below:  Review of Systems  Constitutional: Negative.   HENT: Negative.   Eyes: Negative.   Respiratory: Negative.   Cardiovascular: Negative.   Gastrointestinal: Negative.   Genitourinary: Negative.   Musculoskeletal: Negative.   Skin: Negative.   Neurological: Negative.   Endo/Heme/Allergies: Negative.   Psychiatric/Behavioral: Negative.      Objective:   Vitals:   05/31/20 1207  BP: 118/80  Pulse: 66  Resp: 14  Temp: 98 F (36.7 C)  SpO2: 98%   Height: 5\' 4"  (162.6 cm)  Weight: 213 lb 12.8 oz (97 kg)   Physical Exam Constitutional:      Appearance: She is not diaphoretic.  HENT:     Head: Normocephalic.     Right Ear: Tympanic membrane, ear canal and external ear normal.     Left Ear: Tympanic membrane, ear canal and external ear normal.     Nose: Nose normal. No mucosal edema or rhinorrhea.     Mouth/Throat:     Pharynx: Uvula midline. No oropharyngeal exudate.  Eyes:     Conjunctiva/sclera: Conjunctivae normal.  Neck:     Thyroid: No thyromegaly.     Trachea: Trachea normal. No tracheal tenderness or tracheal deviation.  Cardiovascular:     Rate and Rhythm: Normal rate and regular rhythm.     Heart sounds: S1 normal and S2 normal. Murmur (Systolic) heard.   Pulmonary:     Effort: No respiratory distress.     Breath sounds: Normal breath sounds. No stridor. No wheezing or rales.  Lymphadenopathy:     Head:     Right side of head: No tonsillar adenopathy.     Left side of head: No tonsillar adenopathy.     Cervical: No cervical adenopathy.  Skin:    Findings: No erythema or rash.     Nails: There is no clubbing.  Neurological:     Mental Status: She is alert.      Diagnostics:    Spirometry was performed and demonstrated  an  FEV1 of 1.61 at 69 % of predicted.  Assessment and Plan:   1. Asthma, severe persistent, well-controlled   2. Other allergic rhinitis   3. LPRD (laryngopharyngeal reflux disease)   4. Thrush     1. Continue Advair 230 - 2 inhalations twice a day  2.  Continue nystatin 5 mL swish and swallow after Advair 2 times a day  3. Continue Spiriva 2.5 respimat - two inhalations one time per day.  4. Continue Montelukast 10mg  - one tablet one time per day.  5. Continue Xolair and Epi-Pen  6. Continue pantoprazole 40mg  in the AM and Famotidine 40 mg in the PM  7. If needed:   A. Nasal saline several times per day  B. Mucinex DM two times per day  C. Ipratropium 0.06% -1-2 sprays each nostril 1-2 times per day to dry nose  D. Proventil HFA or albuterol nebulization  8. Add Flovent 220 - 2 inhalations 2 times a day during "FLARE UP"  9. Stop nasal azelastine spray  10. Return to clinic in 6 months or earlier if there is a problem.  [name] appears to be doing very well regarding her airway and reflux issue on her current therapy and we are not really going to change very much other than to make sure that she does not restart her nasal azelastine spray and can use some nasal saline as needed.  Assuming she does well with this plan I will see her back in this clinic in 6 months or earlier if there is a problem.  Allena Katz, MD Allergy / Immunology [place]

## 2020-06-01 ENCOUNTER — Telehealth: Payer: Self-pay

## 2020-06-01 ENCOUNTER — Encounter: Payer: Self-pay | Admitting: Allergy and Immunology

## 2020-06-01 NOTE — Telephone Encounter (Signed)
Patient saw you yesterday and requested that you call her today.  I did ask if I could help her, but she wanted to talk with you directly.  Please advise.

## 2020-06-02 ENCOUNTER — Telehealth: Payer: Self-pay

## 2020-06-02 NOTE — Telephone Encounter (Signed)
Pt already has appt scheduled 06/03/20 at 3:20 with Dr Diona Browner.

## 2020-06-02 NOTE — Telephone Encounter (Signed)
[facility] - Client TELEPHONE ADVICE RECORD [facility] Patient Name: [name] Gender: Female DOB: 1946/04/21 Age: 74 Y 63 M 92 D Return Phone Number: [phone] (Primary), [phone] (Secondary) Address: [address] City/State/Zip: [name] [address] Client  [facility] - Client Client Site [place] - Day Physician [name] - MD Contact Type Call Who Is Calling Patient / Member / Family / Caregiver Call Type Triage / Clinical Caller Name [facility] Relationship To Patient Self Return Phone Number [phone] (Primary) Chief Complaint NUMBNESS/TINGLING- sudden on one side of the body or face Reason for Call Symptomatic / Request for [place] states she had a pic line taken out, she's having pain and tingling sensation. Pic line taken out in August. Translation No Nurse Assessment Nurse: Sumner Boast, RN, [place] D[place] Date/Time Eilene Ghazi Time): 06/02/2020 10:35:03 AM Confirm and document reason for call. If symptomatic, describe symptoms. ---Caller states she had a pic line taken out in August, and she is having pain and tingling sensation in that right arm. Pain is 6 - 7 out of 10 pain scale. Takes Tylenol for pain which does not help. No redness or red streaks. No fever. No drainage from pic line site. States she had the pic for a spine infection. Does the patient have any new or worsening symptoms? ---Yes Will a triage be completed? ---Yes Related visit to physician within the last 2 weeks? ---No Does the PT have any chronic conditions? (i.e. diabetes, asthma, this includes High risk factors for pregnancy, etc.) ---Yes List chronic conditions. ---Diabetes, Type II Asthma Hypertension Chronic Back Pain Is this a behavioral health or substance abuse call? ---No Guidelines Guideline Title Affirmed Question Affirmed Notes Nurse Date/Time  (Eastern Time) Neurologic Deficit [1] Tingling in hand (e.g., pins and needles) AND [2] after prolonged lying on arm AND [3] brief (now gone) Sumner Boast, Therapist, sports, [place] Derry 06/02/2020 10:39:52 AM Disp. Time Eilene Ghazi Time) Disposition Final User 06/02/2020 10:29:07 AM Send to Urgent Antony Haste PLEASE NOTE: All timestamps contained within this report are represented as [place] Standard Time. CONFIDENTIALTY NOTICE: This fax transmission is intended only for the addressee. It contains information that is legally privileged, confidential or otherwise protected from use or disclosure. If you are not the intended recipient, you are strictly prohibited from reviewing, disclosing, copying using or disseminating any of this information or taking any action in reliance on or regarding this information. If you have received this fax in error, please notify us immediately by telephone so that we can arrange for its return to [place]. Phone: [phone], Toll-Free: [phone], Fax: [phone] Page: 2 of 2 Call Id: [id number] 06/02/2020 10:45:23 AM [place], RN, Teola Bradley Disagree/Comply Comply Caller Understands Yes PreDisposition Call Doctor Care Advice Given Per Guideline HOME CARE: * You should be able to treat this at home. CALL BACK IF: * You become worse CARE ADVICE given per Neurologic Deficit (Adult) guideline. Comments User: Baruch Goldmann Date/Time Eilene Ghazi Time): 06/02/2020 10:25:58 AM Caller transferred from the office, has an appointment tomorrow.

## 2020-06-03 ENCOUNTER — Other Ambulatory Visit: Payer: Self-pay

## 2020-06-03 ENCOUNTER — Encounter: Payer: Self-pay | Admitting: Family Medicine

## 2020-06-03 ENCOUNTER — Ambulatory Visit (INDEPENDENT_AMBULATORY_CARE_PROVIDER_SITE_OTHER): Payer: Medicare Other | Admitting: Family Medicine

## 2020-06-03 ENCOUNTER — Ambulatory Visit (INDEPENDENT_AMBULATORY_CARE_PROVIDER_SITE_OTHER)
Admission: RE | Admit: 2020-06-03 | Discharge: 2020-06-03 | Disposition: A | Discharge status: Home / Self Care | Payer: Medicare Other | Source: Ambulatory Visit | Attending: Family Medicine | Admitting: Family Medicine

## 2020-06-03 VITALS — BP 120/82 | HR 85 | Temp 98.5°F | Ht 63.5 in | Wt 209.8 lb

## 2020-06-03 DIAGNOSIS — M79601 Pain in right arm: Secondary | ICD-10-CM

## 2020-06-03 DIAGNOSIS — M79621 Pain in right upper arm: Secondary | ICD-10-CM | POA: Diagnosis not present

## 2020-06-03 DIAGNOSIS — Z23 Encounter for immunization: Secondary | ICD-10-CM | POA: Diagnosis not present

## 2020-06-03 DIAGNOSIS — M19011 Primary osteoarthritis, right shoulder: Secondary | ICD-10-CM | POA: Diagnosis not present

## 2020-06-03 DIAGNOSIS — M25532 Pain in left wrist: Secondary | ICD-10-CM | POA: Insufficient documentation

## 2020-06-03 NOTE — Progress Notes (Signed)
Chief Complaint  Patient presents with  . Arm Pain    Right-since pic line removed in August    History of Present Illness: HPI   74 year old female presents with right arm pain.   She had PICC line removed in 02/2020 ( S/P course of parenteral antibiotics for discitis of lumbosacral region infection   Since then she has posterior lateral right arm pain.Marland Kitchen intermittently  Moderate pain.. steady ache pain. No swelling in either arm.  1-2 times had numbness as well in lateral arm.  P worried about heart issues or bone infection due to PICC line or recent disckitis  No  neck pain  Lifting heavier things make it hurt more.    This visit occurred during the SARS-CoV-2 public health emergency.  Safety protocols were in place, including screening questions prior to the visit, additional usage of staff PPE, and extensive cleaning of exam room while observing appropriate contact time as indicated for disinfecting solutions.   COVID 19 screen:  No recent travel or known exposure to COVID19 The patient denies respiratory symptoms of COVID 19 at this time. The importance of social distancing was discussed today.     Review of Systems  Constitutional: Negative for chills and fever.  HENT: Negative for congestion and ear pain.   Eyes: Negative for pain and redness.  Respiratory: Negative for cough and shortness of breath.   Cardiovascular: Negative for chest pain, palpitations and leg swelling.  Gastrointestinal: Negative for abdominal pain, blood in stool, constipation, diarrhea, nausea and vomiting.  Genitourinary: Negative for dysuria.  Musculoskeletal: Negative for falls and myalgias.  Skin: Negative for rash.  Neurological: Negative for dizziness.  Psychiatric/Behavioral: Negative for depression. The patient is not nervous/anxious.       Past Medical History:  Diagnosis Date  . Allergic rhinitis   . Allergy   . Arthritis   . Asthma   . Chronic headache   . Diabetes  mellitus   . DVT (deep venous thrombosis) ([place])   . Dyspnea   . Heart murmur   . Hypertension   . Penicillin allergy 01/19/2020  . Pulmonary embolism ([place])   . Pulmonary hypertension ([place])   . Seizures ([place])     reports that she has never smoked. She has never used smokeless tobacco. She reports previous alcohol use. She reports that she does not use drugs.   Current Outpatient Medications:  .  acetaminophen (TYLENOL) 650 MG CR tablet, Take 1,300 mg by mouth daily as needed for pain., Disp: , Rfl:  .  ADVAIR HFA 500-93 MCG/ACT inhaler, INHALE 2 PUFFS BY MOUTH TWICE DAILY TO PREVENT COUGH OR WHEEZING, RINSE MOUTH AFTER USE, Disp: 12 g, Rfl: 4 .  albuterol (VENTOLIN HFA) 108 (90 Base) MCG/ACT inhaler, INHALE 2 PUFFS BY MOUTH INTO THE LUNGS EVERY 6 HOURS AS NEEDED FOR WHEEZING OR SHORTNESS OF BREATH, Disp: 54 g, Rfl: 0 .  antiseptic oral rinse (BIOTENE) LIQD, 15 mLs by Mouth Rinse route in the morning and at bedtime., Disp: , Rfl:  .  Apoaequorin (PREVAGEN) 10 MG CAPS, Take 10 mg by mouth daily., Disp: , Rfl:  .  Artificial Saliva (BIOTENE DRY MOUTH) LOZG, Use as directed 1 lozenge in the mouth or throat daily as needed (dry eyes)., Disp: , Rfl:  .  atorvastatin (LIPITOR) 10 MG tablet, Take 1 tablet (10 mg total) by mouth daily., Disp: 90 tablet, Rfl: 3 .  azelastine (ASTELIN) 0.1 % nasal spray, USE 1-2 SPRAYS IN EACH NOSTRIL TWICE  DAILY AS NEEDED, Disp: 30 mL, Rfl: 5 .  Capsaicin-Menthol (SALONPAS GEL EX), Apply 1 application topically daily as needed (pain)., Disp: , Rfl:  .  Coenzyme Q10 (COQ-10) 100 MG CAPS, Take 100 mg by mouth daily., Disp: , Rfl:  .  cyclobenzaprine (FLEXERIL) 10 MG tablet, TAKE 1 TABLET(10 MG) BY MOUTH AT BEDTIME AS NEEDED FOR MUSCLE SPASMS, Disp: 30 tablet, Rfl: 1 .  dextromethorphan-guaiFENesin (MUCINEX DM) 30-600 MG 12hr tablet, Take 1 tablet by mouth 2 (two) times daily as needed for cough., Disp: , Rfl:  .  Empagliflozin-linaGLIPtin (GLYXAMBI) 10-5 MG TABS, Take 1  tablet by mouth daily., Disp: 30 tablet, Rfl: 11 .  EPINEPHrine 0.3 mg/0.3 mL IJ SOAJ injection, USE AS DIRECTED FOR LIFE THREATENING ALLERGIC REACTIONS, Disp: 2 each, Rfl: 2 .  estradiol (ESTRACE) 0.1 MG/GM vaginal cream, 0.5 g of cream intravaginally administered daily for 2 weeks, then reduce to twice weekly, Disp: 42.5 g, Rfl: 12 .  famotidine (PEPCID) 40 MG tablet, TAKE 1 TABLET(40 MG) BY MOUTH DAILY, Disp: 30 tablet, Rfl: 0 .  fluticasone (FLOVENT HFA) 220 MCG/ACT inhaler, INHALE 2 PUFFS BY MOUTH TWICE DAILY DURING FLARE UP AS NEEDED. RINSE, GARGLE AND SPIT AFTER USE, Disp: 12 g, Rfl: 1 .  furosemide (LASIX) 40 MG tablet, TAKE 1 TABLET(40 MG) BY MOUTH DAILY (Patient taking differently: Take 40 mg by mouth daily as needed for edema. ), Disp: 30 tablet, Rfl: 3 .  gabapentin (NEURONTIN) 600 MG tablet, TAKE 1 TABLET BY MOUTH DAILY AT BREAKFAST, 1 TABLET AT LUNCH, AND 2 TABLETS AT BEDTIME, Disp: 360 tablet, Rfl: 1 .  Garlic (GARLIQUE PO), Take 1 tablet by mouth daily., Disp: , Rfl:  .  Homeopathic Products [facility] RELIEF EX), Apply 1 spray topically daily as needed (muscle cramps)., Disp: , Rfl:  .  hydrOXYzine (ATARAX/VISTARIL) 10 MG tablet, Take 1 tablet (10 mg total) by mouth daily as needed for itching., Disp: 15 tablet, Rfl: 0 .  ipratropium (ATROVENT) 0.06 % nasal spray, Place 1-2 sprays each nostril 1-2 times per day (Patient taking differently: Place 2 sprays into both nostrils 2 (two) times daily as needed for rhinitis. ), Disp: 15 mL, Rfl: 5 .  irbesartan (AVAPRO) 150 MG tablet, TAKE 1 TABLET(150 MG) BY MOUTH DAILY, Disp: 30 tablet, Rfl: 2 .  Lactase (DAIRY-RELIEF PO), Take 1 capsule by mouth daily as needed (eating dairy). , Disp: , Rfl:  .  levalbuterol (XOPENEX) 1.25 MG/3ML nebulizer solution, USE 1 VIAL VIA NEBULIZER EVERY 6 HOURS AS NEEDED FOR WHEEZING OR SHORTNESS OF BREATH, Disp: 9 mL, Rfl: 1 .  meclizine (ANTIVERT) 12.5 MG tablet, Take 1 tablet (12.5 mg total) by mouth 3 (three)  times daily as needed for dizziness., Disp: 15 tablet, Rfl: 0 .  Misc Natural Products (GLUCOSAMINE CHONDROITIN TRIPLE) TABS, Take 2 tablets by mouth daily., Disp: , Rfl:  .  montelukast (SINGULAIR) 10 MG tablet, Take 1 tablet (10 mg total) by mouth daily., Disp: 90 tablet, Rfl: 1 .  nystatin (MYCOSTATIN) 100000 UNIT/ML suspension, SWISH AND SWALLOW 5 ML BY MOUTH TWICE DAILY AFTER ADVAIR, Disp: 60 mL, Rfl: 5 .  omalizumab (XOLAIR) 150 MG injection, Inject 300 mg into the skin every 28 (twenty-eight) days., Disp: , Rfl:  .  ondansetron (ZOFRAN-ODT) 4 MG disintegrating tablet, Take 4 mg by mouth every 8 (eight) hours as needed for nausea or vomiting. , Disp: , Rfl:  .  OneTouch Delica Lancets 63A MISC, Use to check blood sugar up to 2  times a day., Disp: 100 each, Rfl: 5 .  ONETOUCH VERIO test strip, Use to check blood sugar up to 2 times a day, Disp: 100 each, Rfl: 5 .  oxybutynin (DITROPAN-XL) 5 MG 24 hr tablet, Take 5 mg by mouth daily., Disp: , Rfl:  .  pantoprazole (PROTONIX) 40 MG tablet, TAKE 1 TABLET(40 MG) BY MOUTH DAILY, Disp: 90 tablet, Rfl: 1 .  PATADAY 0.2 % SOLN, Place 1 drop into both eyes daily as needed (allergies). , Disp: , Rfl: 4 .  Potassium 99 MG TABS, Take 99 mg by mouth daily., Disp: , Rfl:  .  Probiotic Product (ALIGN) 4 MG CAPS, Take 4 mg by mouth daily. , Disp: , Rfl:  .  Psyllium (METAMUCIL FIBER PO), Take by mouth daily as needed., Disp: , Rfl:  .  Pumpkin Seed-Soy Germ (AZO BLADDER CONTROL/GO-LESS) CAPS, Take 1 tablet by mouth 2 (two) times daily., Disp: , Rfl:  .  Tiotropium Bromide Monohydrate (SPIRIVA RESPIMAT) 1.25 MCG/ACT AERS, Inhale 2 puffs into the lungs daily., Disp: 4 g, Rfl: 5 .  triamcinolone cream (KENALOG) 0.1 %, Apply 1 application topically 2 (two) times daily. (Patient taking differently: Apply 1 application topically 2 (two) times daily. Pt takes as needed), Disp: 15 g, Rfl: 0 .  TRULANCE 3 MG TABS, Take 3 mg by mouth daily. , Disp: , Rfl:  .  TURMERIC  PO, Take 1,000 mg by mouth daily., Disp: , Rfl:  .  VOLTAREN 1 % GEL, Apply 2 g topically 4 (four) times daily. (Patient taking differently: Apply 2 g topically 4 (four) times daily as needed (pain). ), Disp: 100 g, Rfl: 2 .  XARELTO 20 MG TABS tablet, TAKE 1 TABLET(20 MG) BY MOUTH DAILY WITH SUPPER, Disp: 90 tablet, Rfl: 1  Current Facility-Administered Medications:  .  omalizumab Arvid Right) injection 300 mg, 300 mg, Subcutaneous, Q28 days, Kozlow, Donnamarie Poag, MD, 300 mg at 05/10/20 0904   Observations/Objective: Blood pressure 120/82, pulse 85, temperature 98.5 F (36.9 C), temperature source Temporal, height 5' 3.5" (1.613 m), weight 209 lb 12 oz (95.1 kg), SpO2 97 %.  Physical Exam Constitutional:      General: She is not in acute distress.    Appearance: Normal appearance. She is well-developed. She is not ill-appearing or toxic-appearing.  HENT:     Head: Normocephalic.     Right Ear: Hearing, tympanic membrane, ear canal and external ear normal. Tympanic membrane is not erythematous, retracted or bulging.     Left Ear: Hearing, tympanic membrane, ear canal and external ear normal. Tympanic membrane is not erythematous, retracted or bulging.     Nose: No mucosal edema or rhinorrhea.     Right Sinus: No maxillary sinus tenderness or frontal sinus tenderness.     Left Sinus: No maxillary sinus tenderness or frontal sinus tenderness.     Mouth/Throat:     Pharynx: Uvula midline.  Eyes:     General: Lids are normal. Lids are everted, no foreign bodies appreciated.     Conjunctiva/sclera: Conjunctivae normal.     Pupils: Pupils are equal, round, and reactive to light.  Neck:     Thyroid: No thyroid mass or thyromegaly.     Vascular: No carotid bruit.     Trachea: Trachea normal.  Cardiovascular:     Rate and Rhythm: Normal rate and regular rhythm.     Pulses: Normal pulses.     Heart sounds: Normal heart sounds, S1 normal and S2 normal. No murmur heard.  No friction rub. No gallop.    Pulmonary:     Effort: Pulmonary effort is normal. No tachypnea or respiratory distress.     Breath sounds: Normal breath sounds. No decreased breath sounds, wheezing, rhonchi or rales.  Abdominal:     General: Bowel sounds are normal.     Palpations: Abdomen is soft.     Tenderness: There is no abdominal tenderness.  Musculoskeletal:     Right upper arm: Tenderness present. No swelling, edema, deformity, lacerations or bony tenderness.     Cervical back: Normal range of motion and neck supple.     Comments: ttp over lateral arm.. no pain at PICC line site  Skin:    General: Skin is warm and dry.     Findings: No rash.  Neurological:     Mental Status: She is alert.  Psychiatric:        Mood and Affect: Mood is not anxious or depressed.        Speech: Speech normal.        Behavior: Behavior normal. Behavior is cooperative.        Thought Content: Thought content normal.        Judgment: Judgment normal.      Assessment and Plan Arm pain, lateral, right Pain not clearly related to PICC site in location.. more over triceps and tender with stretching triceps. ? Triceps tendonitis.  No abcess, no rash.   Given pt recent bony infection and PICC line... will eval with X-ray for evidence of bony infection, but this is unlikely.   Start home PT, tylneol for pain, or tramadol prn.       [name], MD

## 2020-06-03 NOTE — Patient Instructions (Signed)
Start home stretches.  We will call with X-ray results.

## 2020-06-03 NOTE — Addendum Note (Signed)
Addended by: Carter Kitten on: 06/03/2020 04:30 PM   Modules accepted: Orders

## 2020-06-03 NOTE — Assessment & Plan Note (Signed)
Pain not clearly related to PICC site in location.. more over triceps and tender with stretching triceps. ? Triceps tendonitis.  No abcess, no rash.   Given pt recent bony infection and PICC line... will eval with X-ray for evidence of bony infection, but this is unlikely.   Start home PT, tylneol for pain, or tramadol prn.

## 2020-06-06 DIAGNOSIS — J4550 Severe persistent asthma, uncomplicated: Secondary | ICD-10-CM | POA: Diagnosis not present

## 2020-06-07 ENCOUNTER — Ambulatory Visit (INDEPENDENT_AMBULATORY_CARE_PROVIDER_SITE_OTHER): Payer: Medicare Other

## 2020-06-07 ENCOUNTER — Other Ambulatory Visit: Payer: Self-pay

## 2020-06-07 ENCOUNTER — Encounter: Payer: Self-pay | Admitting: Podiatry

## 2020-06-07 ENCOUNTER — Telehealth: Payer: Self-pay | Admitting: *Deleted

## 2020-06-07 ENCOUNTER — Ambulatory Visit (INDEPENDENT_AMBULATORY_CARE_PROVIDER_SITE_OTHER): Payer: Medicare Other | Admitting: Podiatry

## 2020-06-07 DIAGNOSIS — L989 Disorder of the skin and subcutaneous tissue, unspecified: Secondary | ICD-10-CM | POA: Diagnosis not present

## 2020-06-07 DIAGNOSIS — J4550 Severe persistent asthma, uncomplicated: Secondary | ICD-10-CM | POA: Diagnosis not present

## 2020-06-07 NOTE — Telephone Encounter (Signed)
Please inform patient that a cold usually starts to get better at day 7-10 after onset.  We usually use nasal saline a few times per day, Mucinex or Mucinex DM, some type of over-the-counter antihistamines such as Claritin or Zyrtec, and ibuprofen if needed.  Rarely does it require any antibiotics.

## 2020-06-07 NOTE — Telephone Encounter (Signed)
Patient came in for her Xolair injection today and after receiving her injection stated that she had a cold and has been taking Mucinex for the congestion but wanted to know what other medication she could take since she is diabetic, she stated that she now has a cough. Patient stated that she would "like for you to give her a call". Please advise.

## 2020-06-07 NOTE — Telephone Encounter (Signed)
Called and left a message for patient to call the office back and discuss her injection and not feeling well.

## 2020-06-07 NOTE — Telephone Encounter (Signed)
Patient informed of the information below

## 2020-06-13 NOTE — Progress Notes (Signed)
She presents today chief complaint of a painful callus to the right foot. She states that she has shooting pain sometimes.  Objective: Vital signs are stable she is alert and oriented x3 there is no erythema edema cellulitis drainage or odor. She has pain on palpation of a hyperkeratotic lesion there is no bleeding beneath the lesion there is no open lesions noted. No signs of infection.  Assessment: Porokeratotic lesion forefoot. Benign skin lesion.   Plan: Debrided benign skin lesion sharply today with a 15 blade follow-up with her as needed

## 2020-06-14 ENCOUNTER — Other Ambulatory Visit: Payer: Self-pay

## 2020-06-14 ENCOUNTER — Other Ambulatory Visit (INDEPENDENT_AMBULATORY_CARE_PROVIDER_SITE_OTHER): Payer: Medicare Other

## 2020-06-14 ENCOUNTER — Telehealth: Payer: Self-pay | Admitting: Cardiovascular Disease

## 2020-06-14 DIAGNOSIS — R197 Diarrhea, unspecified: Secondary | ICD-10-CM | POA: Diagnosis not present

## 2020-06-14 DIAGNOSIS — K921 Melena: Secondary | ICD-10-CM | POA: Diagnosis not present

## 2020-06-14 DIAGNOSIS — E0500 Thyrotoxicosis with diffuse goiter without thyrotoxic crisis or storm: Secondary | ICD-10-CM | POA: Diagnosis not present

## 2020-06-14 LAB — T3, FREE: T3, Free: 3.8 pg/mL (ref 2.3–4.2)

## 2020-06-14 LAB — TSH: TSH: 0.08 u[IU]/mL — ABNORMAL LOW (ref 0.35–4.50)

## 2020-06-14 NOTE — Telephone Encounter (Signed)
   [facility] Pre-operative Risk Assessment   Request for surgical clearance:  1. What type of surgery is being performed? Endoscopy    2. When is this surgery scheduled? 06/22/2020   3. What type of clearance is required (medical clearance vs. Pharmacy clearance to hold med vs. Both)? Both   4. Are there any medications that need to be held prior to surgery and how long? Xarelto - day before, morning of procedure    5. Practice name and name of physician performing surgery? Dr. Cristina Gong with [facility]   6. What is the office phone number? [phone]   7.   What is the office fax number? [phone]  8.   Anesthesia type (None, local, MAC, general) ? Propofol    Sheral Apley M 06/14/2020, 4:35 PM  _________________________________________________________________   (provider comments below)

## 2020-06-15 ENCOUNTER — Other Ambulatory Visit: Payer: Self-pay | Admitting: Internal Medicine

## 2020-06-15 DIAGNOSIS — E0500 Thyrotoxicosis with diffuse goiter without thyrotoxic crisis or storm: Secondary | ICD-10-CM

## 2020-06-15 LAB — T4, FREE: Free T4: 1.31 ng/dL (ref 0.60–1.60)

## 2020-06-15 MED ORDER — METHIMAZOLE 5 MG PO TABS: 2.5000 mg | ORAL_TABLET | Freq: Every day | ORAL | 3 refills | Status: DC

## 2020-06-15 NOTE — Telephone Encounter (Signed)
Patient with diagnosis of afib, DVT and PE (lupus anticoagulant positive on 1 or 2 separate assays, protein S activity decreased with normal total protein S levels) on Xarelto for anticoagulation.    Procedure: endoscopy Date of procedure: 06/22/20  CHA2DS2-VASc Score = 6  This indicates a 9.7% annual risk of stroke. The patient's score is based upon: CHF History: 0 HTN History: 1 Diabetes History: 1 Stroke History: 2 (recurrent VTE) Vascular Disease History: 0 Age Score: 1 Gender Score: 1  CrCl 75mL/min using adjusted body weight Platelet count 277K  Per office protocol, patient can hold Xarelto for 1 day prior to procedure as requested.

## 2020-06-15 NOTE — Telephone Encounter (Signed)
   Primary Cardiologist: Sanda Klein, MD  Chart reviewed as part of pre-operative protocol coverage. Patient was contacted 06/15/2020 in reference to pre-operative risk assessment for pending surgery as outlined below.  [name] was last seen on 05/26/20 by Dr. Sallyanne Kuster.  Since that day, [name] has done well. She does not have a history of MI or PCI. Normal coronaries in 2012, nuclear stress test 03/2018 reassuring.   Per our clinical pharmacist: Patient with diagnosis of afib, DVT and PE (lupus anticoagulant positive on 1 or 2 separate assays, protein S activity decreased with normal total protein S levels) on Xarelto for anticoagulation.    Procedure: endoscopy Date of procedure: 06/22/20  CHA2DS2-VASc Score = 6  This indicates a 9.7% annual risk of stroke. The patient's score is based upon: CHF History: 0 HTN History: 1 Diabetes History: 1 Stroke History: 2 (recurrent VTE) Vascular Disease History: 0 Age Score: 1 Gender Score: 1  CrCl 36mL/min using adjusted body weight Platelet count 277K  Per office protocol, patient can hold Xarelto for 1 day prior to procedure as requested.  Therefore, based on ACC/AHA guidelines, the patient would be at acceptable risk for the planned procedure without further cardiovascular testing.   The patient was advised that if she develops new symptoms prior to surgery to contact our office to arrange for a follow-up visit, and she verbalized understanding.  I will route this recommendation to the requesting party via Epic fax function and remove from pre-op pool. Please call with questions.  Tami Lin Ladainian Therien, PA 06/15/2020, 11:11 AM

## 2020-06-17 ENCOUNTER — Other Ambulatory Visit: Payer: Self-pay

## 2020-06-17 ENCOUNTER — Ambulatory Visit: Payer: Medicare Other

## 2020-06-17 ENCOUNTER — Ambulatory Visit: Payer: Self-pay

## 2020-06-17 DIAGNOSIS — E1159 Type 2 diabetes mellitus with other circulatory complications: Secondary | ICD-10-CM

## 2020-06-17 DIAGNOSIS — I152 Hypertension secondary to endocrine disorders: Secondary | ICD-10-CM

## 2020-06-17 DIAGNOSIS — Z1159 Encounter for screening for other viral diseases: Secondary | ICD-10-CM | POA: Diagnosis not present

## 2020-06-17 DIAGNOSIS — E1169 Type 2 diabetes mellitus with other specified complication: Secondary | ICD-10-CM

## 2020-06-17 NOTE — Chronic Care Management (AMB) (Signed)
Chronic Care Management Pharmacy  Name: [name]  MRN: [id number] DOB: 12-09-1945  Chief Complaint/ HPI  [name],  74 y.o., female presents for their Follow-Up CCM visit with the clinical pharmacist via telephone.  PCP : Alicia Sanders, MD  Their chronic conditions include: hypertension, afib, migraine headache, asthma, allergic rhinitis, sleep apnea, GERD, diabetes, neuropathy, Graves disease, osteoarthritis, depression, chronic pain, urge incontinence, high cholesterol   Update 06/17/20: Patient denies any medication problems. She saw GI yesterday. Reports she was started back on Trulance and Benefiber, they also recommended she start OTC iron. We discussed dosing of iron and possible adrs - constipation.  Patient concerns: Pt concerned about number of medications. Discussed purpose of each medication and medications that might could be considering for discontinuation under doctors supervision - gabapentin, oxybutynin, OTC supplements. Pt reports if she could stop one medication it would be gabapentin. Reports she discussed concern for lack of efficacy with podiatry but was encouraged to continue for now.   Office Visits: Last CCM visit 12/02/19  06/03/20: Arm pain - Start home PT, tylneol for pain, or tramadol prn.  04/19/20: AWV - cont current meds  12/01/19: Allergy/Asthma - pt called with wheezing/SOB/sputum, advised to take prednisone for 10 days and add Flovent until symptoms resolved  11/24/19: Allergy/Asthma - cont current meds  Consult Visit:  05/31/20: Allergy and Asthma center - well controlled, continue current therapy  10/20/19: Endocrinology - thyrotoxicosis/thyroidnodule, decreased methimazole to 5 mg every other day with meal 09/2018, continues on diltiazem CD, rtc 6 months  10/06/19: Immunotherapy - asthma, Xolair injection  09/29/19: Podiatry - foot exam/debridement  08/04/19: Telephone (pulmonology) - question regarding 2 very similar inhalers and  thrush, may try Advair once daily along with Spiriva due to hoaresness  07/21/19: Pulmonology - thrush - continue Advair 2 puffs BID, start Nystatin after Advair BID, continue Spiriva 2 puffs daily, montelukast, Xolair, and Epi-Pen, PRN - saline spray, Mucinex DM BID, azelastine nasal spray BID, ipratropium nasal spray- BID PRN, albuterol nebulizer or inhaler PRN  - add Flovent 220 - 2 puffs BID for flare ups   04/23/19: Cardiology - cont current meds  Allergies  Allergen Reactions  . Latex Hives  . Penicillins Swelling    Has patient had a PCN reaction causing immediate rash, facial/tongue/throat swelling, SOB or lightheadedness with hypotension patient had a PCN reaction causing severe rash involving mucus membranes or skin necrosis: [id number] Has patient had a PCN reaction that required hospitalization/No Has patient had a PCN reaction occurring within the last 10 years: No If all of the above answers are "NO", then may proceed with Cephalosporin use.     Recardo Evangelist [Pregabalin] Other (See Comments)    Blurred vision.  . Nickel Rash  . Phenytoin Sodium Extended Swelling  . Vicodin [Hydrocodone-Acetaminophen] Nausea And Vomiting   Medications: Outpatient Encounter Medications as of 06/17/2020  Medication Sig Note  . acetaminophen (TYLENOL) 650 MG CR tablet Take 1,300 mg by mouth daily as needed for pain. 01/06/2020: Pt doesn't take both the tylenol and the tylenol 3 together  . ADVAIR HFA 230-21 MCG/ACT inhaler INHALE 2 PUFFS BY MOUTH TWICE DAILY TO PREVENT COUGH OR WHEEZING, RINSE MOUTH AFTER USE   . albuterol (VENTOLIN HFA) 108 (90 Base) MCG/ACT inhaler INHALE 2 PUFFS BY MOUTH INTO THE LUNGS EVERY 6 HOURS AS NEEDED FOR WHEEZING OR SHORTNESS OF BREATH   . antiseptic oral rinse (BIOTENE) LIQD 15 mLs by Mouth Rinse route in the morning and  at bedtime.   Marland Kitchen Apoaequorin (PREVAGEN) 10 MG CAPS Take 10 mg by mouth daily.   . Artificial Saliva (BIOTENE DRY MOUTH) LOZG Use as directed 1 lozenge  in the mouth or throat daily as needed (dry eyes).   Marland Kitchen atorvastatin (LIPITOR) 10 MG tablet Take 1 tablet (10 mg total) by mouth daily.   Marland Kitchen azelastine (ASTELIN) 0.1 % nasal spray USE 1-2 SPRAYS IN EACH NOSTRIL TWICE DAILY AS NEEDED   . Capsaicin-Menthol (SALONPAS GEL EX) Apply 1 application topically daily as needed (pain).   . Coenzyme Q10 (COQ-10) 100 MG CAPS Take 100 mg by mouth daily.   . cyclobenzaprine (FLEXERIL) 10 MG tablet TAKE 1 TABLET(10 MG) BY MOUTH AT BEDTIME AS NEEDED FOR MUSCLE SPASMS   . dextromethorphan-guaiFENesin (MUCINEX DM) 30-600 MG 12hr tablet Take 1 tablet by mouth 2 (two) times daily as needed for cough.   . Empagliflozin-linaGLIPtin (GLYXAMBI) 10-5 MG TABS Take 1 tablet by mouth daily.   Marland Kitchen EPINEPHrine 0.3 mg/0.3 mL IJ SOAJ injection USE AS DIRECTED FOR LIFE THREATENING ALLERGIC REACTIONS   . estradiol (ESTRACE) 0.1 MG/GM vaginal cream 0.5 g of cream intravaginally administered daily for 2 weeks, then reduce to twice weekly   . fluticasone (FLOVENT HFA) 220 MCG/ACT inhaler INHALE 2 PUFFS BY MOUTH TWICE DAILY DURING FLARE UP AS NEEDED. RINSE, GARGLE AND SPIT AFTER USE   . gabapentin (NEURONTIN) 600 MG tablet TAKE 1 TABLET BY MOUTH DAILY AT BREAKFAST, 1 TABLET AT LUNCH, AND 2 TABLETS AT BEDTIME   . Homeopathic Products [facility] RELIEF EX) Apply 1 spray topically daily as needed (muscle cramps).   . hydrOXYzine (ATARAX/VISTARIL) 10 MG tablet Take 1 tablet (10 mg total) by mouth daily as needed for itching.   Marland Kitchen ipratropium (ATROVENT) 0.06 % nasal spray Place 1-2 sprays each nostril 1-2 times per day (Patient taking differently: Place 2 sprays into both nostrils 2 (two) times daily as needed for rhinitis. )   . irbesartan (AVAPRO) 150 MG tablet TAKE 1 TABLET(150 MG) BY MOUTH DAILY   . Lactase (DAIRY-RELIEF PO) Take 1 capsule by mouth daily as needed (eating dairy).    Marland Kitchen levalbuterol (XOPENEX) 1.25 MG/3ML nebulizer solution USE 1 VIAL VIA NEBULIZER EVERY 6 HOURS AS NEEDED FOR  WHEEZING OR SHORTNESS OF BREATH   . meclizine (ANTIVERT) 12.5 MG tablet Take 1 tablet (12.5 mg total) by mouth 3 (three) times daily as needed for dizziness.   . methimazole (TAPAZOLE) 5 MG tablet Take 0.5 tablets (2.5 mg total) by mouth daily.   . Misc Natural Products (GLUCOSAMINE CHONDROITIN TRIPLE) TABS Take 2 tablets by mouth daily.   . montelukast (SINGULAIR) 10 MG tablet Take 1 tablet (10 mg total) by mouth daily.   Marland Kitchen nystatin (MYCOSTATIN) 100000 UNIT/ML suspension SWISH AND SWALLOW 5 ML BY MOUTH TWICE DAILY AFTER ADVAIR   . omalizumab (XOLAIR) 150 MG injection Inject 300 mg into the skin every 28 (twenty-eight) days.   . ondansetron (ZOFRAN-ODT) 4 MG disintegrating tablet Take 4 mg by mouth every 8 (eight) hours as needed for nausea or vomiting.    [name] Delica Lancets 65H MISC Use to check blood sugar up to 2 times a day.   [name] VERIO test strip Use to check blood sugar up to 2 times a day   . oxybutynin (DITROPAN-XL) 5 MG 24 hr tablet Take 5 mg by mouth daily.   . pantoprazole (PROTONIX) 40 MG tablet TAKE 1 TABLET(40 MG) BY MOUTH DAILY   . PATADAY 0.2 %  SOLN Place 1 drop into both eyes daily as needed (allergies).    . Potassium 99 MG TABS Take 99 mg by mouth daily.   . Psyllium (METAMUCIL FIBER PO) Take by mouth daily as needed.   . Pumpkin Seed-Soy Germ (AZO BLADDER CONTROL/GO-LESS) CAPS Take 1 tablet by mouth 2 (two) times daily.   . Tiotropium Bromide Monohydrate (SPIRIVA RESPIMAT) 1.25 MCG/ACT AERS Inhale 2 puffs into the lungs daily.   Marland Kitchen triamcinolone cream (KENALOG) 0.1 % Apply 1 application topically 2 (two) times daily. (Patient taking differently: Apply 1 application topically 2 (two) times daily. Pt takes as needed)   . TRULANCE 3 MG TABS Take 3 mg by mouth daily.    . TURMERIC PO Take 1,000 mg by mouth daily.   . VOLTAREN 1 % GEL Apply 2 g topically 4 (four) times daily. (Patient taking differently: Apply 2 g topically 4 (four) times daily as needed (pain). )   .  XARELTO 20 MG TABS tablet TAKE 1 TABLET(20 MG) BY MOUTH DAILY WITH SUPPER   . [DISCONTINUED] famotidine (PEPCID) 40 MG tablet TAKE 1 TABLET(40 MG) BY MOUTH DAILY   . [DISCONTINUED] furosemide (LASIX) 40 MG tablet TAKE 1 TABLET(40 MG) BY MOUTH DAILY (Patient taking differently: Take 40 mg by mouth daily as needed for edema. )   . [DISCONTINUED] Garlic (GARLIQUE PO) Take 1 tablet by mouth daily.   . [DISCONTINUED] Probiotic Product (ALIGN) 4 MG CAPS Take 4 mg by mouth daily.     Facility-Administered Encounter Medications as of 06/17/2020  Medication  . omalizumab Arvid Right) injection 300 mg   Current Diagnosis/Assessment:   Financial Resource Strain: Low Risk   . Difficulty of Paying Living Expenses: Not hard at all    Goals    . Increase water intake     Starting 03/28/2018, I will continue to drink at least 6-8 glasses of water daily.     Marland Kitchen Pharmacy Care Plan: Diabetes     CARE PLAN ENTRY  Current Barriers:  . Diabetes: controlled; complicated by chronic medical conditions including high blood pressure Lab Results  Component Value Date   HGBA1C 6.4 04/09/2019 .   Lab Results  Component Value Date   CREATININE 1.00 04/13/2019   CREATININE 0.94 04/09/2019   CREATININE 1.12 03/28/2018   . Current antihyperglycemic regimen: Empagliflozin-linagliptin (Glyxambi) 10 mg-5 mg - 1 tablet daily in the morning  . Denies hypoglycemic symptoms, including dizziness, lightheadedness, shaking, sweating . Current exercise: occasional use of stationary bike or foot pedaling system . Current blood glucose readings: Fasting BG: 112-120s  . Cardiovascular risk reduction: o Current hypertensive regimen: irbesartan 150 mg daily o Current hyperlipidemia regimen: atorvastatin 10 mg daily o Current antiplatelet regimen: no pharmacotherapy, on Xarelto  Pharmacist Clinical Goal(s):  Marland Kitchen Over the next 3 months, patient will work with PharmD and primary care provider to address need for  cholesterol-lowering therapy to reduce cardiovascular risk - resolved, maintain goal of LDL < 100, HDL > 50, TG < 150   Interventions: . Comprehensive medication review  Patient Self Care Activities:  . Patient will continue to check blood glucose weekly before breakfast, document, and provide at future appointments . Patient will focus on medication adherence by continuing to use pillbox  . Patient will continue atorvastatin 10 mg daily as prescribed . Patient will contact provider with any episodes of hypoglycemia  Please see past updates related to this goal by clicking on the "Past Updates" button in the selected goal     .  Pharmacy Care Plan: Hypertension     CARE PLAN ENTRY: Hypertension/Weight Loss  Current Barriers:  . Controlled hypertension, complicated by diabetes . Current antihypertensive regimen:   Irbesartan 150 mg - 1 tablet daily in the morning  Potassium 99 mg - 1 tablet daily in the evening . Previous antihypertensives tried: multiple (valsartan, diltiazem, amlodipine) . Last practice recorded BP readings:  BP Readings from Last 3 Encounters:  10/20/19 140/80  10/20/19 106/60  07/21/19 124/82 .  Current home BP readings: none, patient is not checking routinely at home  Pharmacist Clinical Goal(s):  Marland Kitchen Over the next 3 months, patient will work with PharmD and providers on the following goals: o Work towards weight loss goal of less than 200 lbs by increasing exercise and making dietary changes  Interventions: . Comprehensive medication review  Patient Self Care Activities: Over the next 3 months patient will: . Continue to check BP if symptomatic, document, and provide at future appointments . Focus on medication adherence by continuing to use pillbox and updating home medication list . Increase exercise by trying cycling to achieve goal of 10 minutes per day over the next 3 months  Please see past updates related to this goal by clicking on the "Past  Updates" button in the selected goal       Diabetes   Recent Relevant Labs: Lab Results  Component Value Date/Time   HGBA1C 6.2 04/20/2020 11:31 AM   HGBA1C 6.4 04/09/2019 01:32 PM   HGBA1C 6.2 08/14/2016 12:00 AM   HGBA1C 6.1 01/27/2016 12:00 AM    Lab Results  Component Value Date   CREATININE 0.91 04/20/2020   BUN 10 04/20/2020   GFR 62.27 04/20/2020   GFRNONAA 53 (L) 01/04/2020   GFRAA 62 01/04/2020   NA 141 04/20/2020   K 3.6 04/20/2020   CALCIUM 8.9 04/20/2020   CO2 24 04/20/2020   Checking BG: occasionally Patient is currently controlled on the following medications:   Glyxambi 10-5 mg - 1 tablet daily  Lab Results  Component Value Date/Time   HMDIABEYEEXA No Retinopathy 01/05/2020 12:00 AM    Lab Results  Component Value Date/Time   HMDIABFOOTEX done 04/19/2020 12:00 AM    Update 06/17/20: continues Glyxambi, denies cost concerns. Doing well.  Plan: Continue current medications and control with diet and exercise  Hyperlipidemia   CMP Latest Ref Rng & Units 04/20/2020 01/04/2020 04/13/2019  Glucose 70 - 99 mg/dL 97 125(H) 111(H)  BUN 6 - 23 mg/dL 10 8 12   Creatinine 0.40 - 1.20 mg/dL 0.91 1.04(H) 1.00  Sodium 135 - 145 mEq/L 141 143 143  Potassium 3.5 - 5.1 mEq/L 3.6 4.7 4.5  Chloride 96 - 112 mEq/L 110 107 106  CO2 19 - 32 mEq/L 24 26 26   Calcium 8.4 - 10.5 mg/dL 8.9 9.8 9.2  Total Protein 6.0 - 8.3 g/dL 6.7 6.6 -  Total Bilirubin 0.2 - 1.2 mg/dL 0.8 0.5 -  Alkaline Phos 39 - 117 U/L 84 - -  AST 0 - 37 U/L 15 17 -  ALT 0 - 35 U/L 7 10 -    Lipid Panel     Component Value Date/Time   CHOL 117 04/20/2020 1131   TRIG 71.0 04/20/2020 1131   HDL 46.60 04/20/2020 1131   CHOLHDL 3 04/20/2020 1131   VLDL 14.2 04/20/2020 1131   LDLCALC 56 04/20/2020 1131    The ASCVD Risk score ([place] et al., 2013) failed to calculate for the following reasons:  The valid total cholesterol range is 130 to 320 mg/dL   LDL goal < 100 Patient has failed these  meds in past: none  Patient is currently controlled on the following medications:   Atorvastatin 10 mg - 1 tablet daily  Update 06/17/20: Pt continues statin daily, would like to know why she needs it as her cholesterol has always been good. Discussed importance of statin with diabetes and her CV risk. Pt agreed to continue.  Plan: Continue current medications  Asthma   Last spirometry score: 01/21 FEV1 77%  Eosinophil count:   Lab Results  Component Value Date/Time   EOSPCT 2.0 01/04/2020 10:59 AM   EOSPCT 1.3 02/01/2011 11:06 AM  %                               Eos (Absolute):  Lab Results  Component Value Date/Time   EOSABS 122 01/04/2020 10:59 AM   EOSABS 0.1 02/01/2011 11:06 AM   Tobacco Status:  Social History   Tobacco Use  Smoking Status Never Smoker  Smokeless Tobacco Never Used   CPAP use: most nights  Patient has failed these meds in past: none reported Patient is currently uncontrolled on the following medications:  Maintenance:  Advair (fluticasone-salmeterol 230-21 mcg/act) - 2 puffs twice daily   Spiriva (tiotropium 1.25 mcg/act) - 2 puffs daily   Rescue/As needed:  Flovent (fluticasone 220 mcg) - 2 puffs BID during flare ups PRN   Albuterol by nebulizer - PRN  Allergies:  Montelukast 10 mg - 1 tablet daily at bedtime   Xolair injection 300 mg - Warren monthly   Azelastine nasal spray - BID   Ipratropium nasal spray - BID as needed  Mucinex DM - 1 daily PRN  Update 06/17/20: Using maintenance inhaler regularly? Yes Frequency of rescue inhaler use: Flovent - less than once a month; using nebulizer 2-3 times a week  Plan: Continue current medications    OA/Chronic Pain   Pain: back pain - stable  Patient has tried these meds in past: Tylenol with codeine Patient is currently controlled on the following medications:   Cyclobenzaprine 10 mg - 1 tablet qhs   OTC Salonpas cream and spray - as needed (less than once daily, but uses on regular  basis)  Voltaren gel 1% - apply up to QID PRN  Tylenol Arthritis - PRN breakthrough pain  Supplements:  Glucosamine 1500 mg - 2 tablets daily  Boswellia - 1 tablet daily  Update 06/17/20: reports no longer taking Tylenol with codeine. Continues Tylenol Arthritis PRN, cyclobenzaprine qhs, and Voltaren gel PRN. Discussed potentially d/c supplements to reduce pill burden.  Plan: Continue current medications; Recommend d/c Boswellia to reduce pill burden.  GERD   Patient has tried these meds in past: Pepcid, probiotic Patient is currently controlled on the following medications:   Pantoprazole 40 mg - 1 tablet daily 30 minutes before breakfast  Update 06/17/20: Pt reports she is no longer taking Pepcid or a probiotic, only taking pantoprazole. Reminded patient to take pantoprazole 30-60 min before eating, esp. If having residual symptoms.  Plan: Continue current medications. Take Pantoprazole 30 minutes before breakfast for best results.   Urge Incontinence   Patient has failed these meds in past: none reported Patient is currently controlled on the following medications:   Oxybutynin ER 5 mg - 1 tablet daily  Azo Bladder Control  We discussed: pt reports noticeable difference once adding Azo. Followed  by urology. Discussed benefits/risks of oxybutynin including anticholinergic effects. Encouraged discussion with urology if interested in discontinuation.  Plan: Continue current medications   Neuropathy   Patient has failed these meds in past: none reported Patient is currently uncontrolled on the following medications:   Gabapentin 600 mg - 1 at BF, 1 at lunch, 2 at bedtime  We discussed: Pt reports taking gabapentin as prescribed. Reports she still has nerve pain on and off. She has not noticed a difference with gabapentin. Discussed not discontinuing therapy without taper.  Plan: Continue current medications. May consider tapering off dose by reducing morning dose first  for 3-4 weeks. Then, lunch dose for 3-4 weeks. Caution with questionable seizure history per chart.  Vaccines   Reviewed and discussed patient's vaccination history.    Immunization History  Administered Date(s) Administered  . Fluad Quad(high Dose 65+) 04/10/2019  . Influenza,inj,Quad PF,6+ Mos 03/25/2014, 03/22/2016, 04/04/2017, 04/01/2018, 06/03/2020  . Influenza,inj,quad, With Preservative 03/16/2018  . Influenza-Unspecified 04/13/2015, 03/18/2017  . PFIZER SARS-COV-2 Vaccination 01/04/2020, 01/25/2020  . Pneumococcal Conjugate-13 05/05/2015  . Pneumococcal Polysaccharide-23 04/03/2012  . Tdap 10/19/2013  . Zoster 05/11/2013    Plan: Pt reports flu shot was received by [name] at [facility] last visit. Records are up to date - 06/03/20, however she is receiving call from [facility] regarding flu shot.   Medication Management  Pharmacy/Benefits: Medicare/BCBS FEP, DOD/[facility]  Adherence: 7 day pillbox   Affordability: denies concerns  Medication review:  No longer on lasix per cardio, still taking potassium, no longer on magnesium  Supplements/OTCs:  MiraLAX - PRN  Azo - reports noticeable improvement with use  Coq10 - daily  Prevagen - daily  Tumeric - daily  Mucinex DM every day recently   Plan: Recommend discontinuing supplements - CoQ10, tumeric, Prevagen to reduce pill burden/lack of efficacy.  CCM Follow Up: 6 months (telephone)  Debbora Dus, PharmD Clinical Pharmacist [facility] at [facility] [phone]

## 2020-06-17 NOTE — Chronic Care Management (AMB) (Signed)
     Chronic Care Management Pharmacy  Name: [name]  MRN: [id number] DOB: 12-16-1945  Attempted to reach patient for follow up CCM visit on 06/17/20 at 63 AM. Left voicemail with contact information.  Debbora Dus, PharmD Clinical Pharmacist [facility] at [facility] [phone]

## 2020-06-19 NOTE — Patient Instructions (Signed)
Dear [name],  Below is a summary of the goals we discussed during our follow up appointment on June 19, 2020. Please contact me anytime with questions or concerns.   Visit Information  Goals Addressed            This Visit's Progress   . Pharmacy Care Plan: Diabetes       CARE PLAN ENTRY  Current Barriers:  . Diabetes: controlled; complicated by chronic medical conditions including high blood pressure Lab Results  Component Value Date   HGBA1C 6.4 04/09/2019 .   Lab Results  Component Value Date   CREATININE 1.00 04/13/2019   CREATININE 0.94 04/09/2019   CREATININE 1.12 03/28/2018   . Current antihyperglycemic regimen: Empagliflozin-linagliptin (Glyxambi) 10 mg-5 mg - 1 tablet daily in the morning  . Denies hypoglycemic symptoms, including dizziness, lightheadedness, shaking, sweating . Current exercise: occasional use of stationary bike or foot pedaling system . Current blood glucose readings: Fasting BG: 112-120s  . Cardiovascular risk reduction: o Current hypertensive regimen: irbesartan 150 mg daily o Current hyperlipidemia regimen: atorvastatin 10 mg daily o Current antiplatelet regimen: no pharmacotherapy, on Xarelto  Pharmacist Clinical Goal(s):  Marland Kitchen Over the next 3 months, patient will work with PharmD and primary care provider to address need for cholesterol-lowering therapy to reduce cardiovascular risk - resolved, maintain goal of LDL < 100, HDL > 50, TG < 150   Interventions: . Comprehensive medication review  Patient Self Care Activities:  . Patient will continue to check blood glucose weekly before breakfast, document, and provide at future appointments . Patient will focus on medication adherence by continuing to use pillbox  . Patient will continue atorvastatin 10 mg daily as prescribed . Patient will contact provider with any episodes of hypoglycemia  Please see past updates related to this goal by clicking on the "Past Updates" button in the  selected goal     . Pharmacy Care Plan: Hypertension       CARE PLAN ENTRY: Hypertension/Weight Loss  Current Barriers:  . Controlled hypertension, complicated by diabetes . Current antihypertensive regimen:   Irbesartan 150 mg - 1 tablet daily in the morning  Potassium 99 mg - 1 tablet daily in the evening . Previous antihypertensives tried: multiple (valsartan, diltiazem, amlodipine) . Last practice recorded BP readings:  BP Readings from Last 3 Encounters:  10/20/19 140/80  10/20/19 106/60  07/21/19 124/82 .  Current home BP readings: none, patient is not checking routinely at home  Pharmacist Clinical Goal(s):  Marland Kitchen Over the next 3 months, patient will work with PharmD and providers on the following goals: o Work towards weight loss goal of less than 200 lbs by increasing exercise and making dietary changes  Interventions: . Comprehensive medication review  Patient Self Care Activities: Over the next 3 months patient will: . Continue to check BP if symptomatic, document, and provide at future appointments . Focus on medication adherence by continuing to use pillbox and updating home medication list . Increase exercise by trying cycling to achieve goal of 10 minutes per day over the next 3 months  Please see past updates related to this goal by clicking on the "Past Updates" button in the selected goal        The patient verbalized understanding of instructions, educational materials, and care plan provided today and declined offer to receive copy of patient instructions, educational materials, and care plan.   The pharmacy team will reach out to the patient again over the next 90  days.   Debbora Dus, [facility]    Basics of Medicine Management Taking your medicines correctly is an important part of managing or preventing medical problems. Make sure you know what disease or condition your medicine is treating, and how and when to take it. If you do not take your medicine  correctly, it may not work well and may cause unpleasant side effects, including serious health problems. What should I do when I am taking medicines?   Read all the labels and inserts that come with your medicines. Review the information often.  Talk with your pharmacist if you get a refill and notice a change in the size, color, or shape of your medicines.  Know the potential side effects for each medicine that you take.  Try to get all your medicines from the same pharmacy. The pharmacist will have all your information and will understand how your medicines will affect each other (interact).  Tell your health care provider about all your medicines, including over-the-counter medicines, vitamins, and herbal or dietary supplements. He or she will make sure that nothing will interact with any of your prescribed medicines. How can I take my medicines safely?  Take medicines only as told by your health care provider. ? Do not take more of your medicine than instructed. ? Do not take anyone else's medicines. ? Do not share your medicines with others. ? Do not stop taking your medicines unless your health care provider tells you to do so. ? You may need to avoid alcohol or certain foods or liquids when taking certain medicines. Follow your health care provider's instructions.  Do not split, mash, or chew your medicines unless your health care provider tells you to do so. Tell your health care provider if you have trouble swallowing your medicines.  For liquid medicine, use the dosing container that was provided. How should I organize my medicines?  Know your medicines  Know what each of your medicines looks like. This includes size, color, and shape. Tell your health care provider if you are having trouble recognizing all the medicines that you are taking.  If you cannot tell your medicines apart because they look similar, keep them in original bottles.  If you cannot read the labels on  the bottles, tell your pharmacist to put your medicines in containers with large print.  Review your medicines and your schedule with family members, a friend, or a caregiver. Use a pill organizer  Use a tool to organize your medicine schedule. Tools include a weekly pillbox, a written chart, a notebook, or a calendar.  Your tool should help you remember the following things about each medicine: ? The name of the medicine. ? The amount (dose) to take. ? The schedule. This is the day and time the medicine should be taken. ? The appearance. This includes color, shape, size, and stamp. ? How to take your medicines. This includes instructions to take them with food, without food, with fluids, or with other medicines.  Create reminders for taking your medicines. Use sticky notes, or alarms on your watch, mobile device, or phone calendar.  You may choose to use a more advanced management system. These systems have storage, alarms, and visual and audio prompts.  Some medicines can be taken on an "as-needed" basis. These include medicines for nausea or pain. If you take an as-needed medicine, write down the name and dose, as well as the date and time that you took it. How should I plan for  travel?  Take your pillbox, medicines, and organization system with you when traveling.  Have your medicines refilled before you travel. This will ensure that you do not run out of your medicines while you are away from home.  Always carry an updated list of your medicines with you. If there is an emergency, a first responder can quickly see what medicines you are taking.  Do not pack your medicines in checked luggage in case your luggage is lost or delayed.  If any of your medicines is considered a controlled substance, make sure you bring a letter from your health care provider with you. How should I store and discard my medicines? For safe storage:  Store medicines in a cool, dry area away from light,  or as directed by your health care provider. Do not store medicines in the bathroom. Heat and humidity will affect them.  Do not store your medicines with other chemicals, or with medicines for pets or other household members.  Keep medicines away from children and pets. Do not leave them on counters or bedside tables. Store them in high cabinets or on high shelves. For safe disposal:  Check expiration dates regularly. Do not take expired medicines. Discard medicines that are older than the expiration date.  Learn a safe way to dispose of your medicines. You may: ? Use a local government, hospital, or pharmacy medicine-take-back program. ? Mix the medicines with inedible substances, put them in a sealed bag or empty container, and throw them in the trash. What should I remember?  Tell your health care provider if you: ? Experience side effects. ? Have new symptoms. ? Have other concerns about taking your medicines.  Review your medicines regularly with your health care provider. Other medicines, diet, medical conditions, weight changes, and daily habits can all affect how medicines work. Ask if you need to continue taking each medicine, and discuss how well each one is working.  Refill your medicines early to avoid running out of them.  In case of an accidental overdose, call your local [place] at [phone] or visit your local emergency department immediately. This is important. Summary  Taking your medicines correctly is an important part of managing or preventing medical problems.  You need to make sure that you understand what you are taking a medicine for, as well as how and when you need to take it.  Know your medicines and use a pill organizer to help you take your medicines correctly.  In case of an accidental overdose, call your local [place] at [phone] or visit your local emergency department immediately. This is important. This  information is not intended to replace advice given to you by your health care provider. Make sure you discuss any questions you have with your health care provider. Document Revised: 06/27/2017 Document Reviewed: 06/27/2017 Elsevier Patient Education  2020 [employer].

## 2020-06-20 ENCOUNTER — Encounter: Payer: Self-pay | Admitting: Registered"

## 2020-06-20 ENCOUNTER — Other Ambulatory Visit: Payer: Self-pay

## 2020-06-20 ENCOUNTER — Encounter: Payer: Medicare Other | Attending: Family Medicine | Admitting: Registered"

## 2020-06-20 DIAGNOSIS — E119 Type 2 diabetes mellitus without complications: Secondary | ICD-10-CM | POA: Diagnosis not present

## 2020-06-20 DIAGNOSIS — E1140 Type 2 diabetes mellitus with diabetic neuropathy, unspecified: Secondary | ICD-10-CM | POA: Insufficient documentation

## 2020-06-20 NOTE — Patient Instructions (Addendum)
Ways improve health: Increase vegetable intake, use DASH diet handout for servings sizes Balance the non-starchy vegetables and starchy vegetables.  If you are eating a good variety of fruits and vegetables you do not need to worry about taking vitamins. Continue drinking plenty of water Consider getting on your exercise bike daily, even 5 or 10 min. Higher fat foods can increase likelihood of acid indigestion

## 2020-06-20 NOTE — Telephone Encounter (Signed)
Refaxed pre-op risk assessment to requested number.

## 2020-06-20 NOTE — Telephone Encounter (Signed)
° °  Freda Munro from Dr. Buccini's office called, they said they did not receive the clearance and request if it can refax it to them. Verified fax # [phone]

## 2020-06-20 NOTE — Progress Notes (Signed)
Diabetes Self-Management Education  Visit Type: First/Initial  Appt. Start Time: 1100 Appt. End Time: 1200  06/20/2020  Ms. [facility], identified by name and date of birth, is a 74 y.o. female with a diagnosis of Diabetes: Type 2.   ASSESSMENT  There were no vitals taken for this visit. There is no height or weight on file to calculate BMI.  Patient states she would like to get off some medications. Patient is taking one DM medication: Glyxambi  Pt states she randomly checks fasting blood sugar and occasionally 1 hour after eating, ~145 mg/dL. Pt states recently her A1c was 5.9%, but most recent is 6.2%  Pt reports she has lost approximately 50 lbs in the last 2 years. Pt was not able to attribute weight loss to any specific change. Pt states she had started swimming prior to [place] but has not been back since the pandemic. Pt reports she eats two meals per day and has had this habit for a long time. Pt reports she does not eat lunch because not hungry. Pt states main meal of day is around 7 pm.  Pt reports Lactose intolerance. Pt states she used to eat a lot of cheese has cut back some. Pt states she keeps Fritos, and potato chips in the house but doesn't eat that often. Pt states she ate pizza the other day.  Pt states she stopped taking multivitamin because her doctor told her that her vitamin D lab was too high. Pt states she is going to start iron OTC per MD advice. Pt states she usually takes garlic pills. Pt states dry mouth continues.  Bowel Movements: Pt c/o watery stools and states she has appointment scheduled with gastroenterology to do some test. Pt states her BM switch back and forth from diarrhea to constipation.   Sleep: Pt states difficulty sleeping mostly due to pain. Pt reports she uses CPAP but doesn't think it helps. Pt reports biopsy on her back revealed that she had infection in bone that was causing pain   Physical Activity: Pt states due to back and knee pain  she doesn't get a lot o physical activity. Pt states she has a stationery bike only uses it when she feels like it.     Diabetes Self-Management Education - 06/20/20 1109      Visit Information   Visit Type First/Initial      Initial Visit   Diabetes Type Type 2    Are you currently following a meal plan? No    Are you taking your medications as prescribed? Yes    Date Diagnosed 2000s      Health Coping   How would you rate your overall health? Fair      Psychosocial Assessment   What is the last grade level you completed in school? 2 yrs college      Complications   Last HgB A1C per patient/outside source 6.2 %    Have you had a dilated eye exam in the past 12 months? Yes    Have you had a dental exam in the past 12 months? Yes    Are you checking your feet? Yes    How many days per week are you checking your feet? 4      Dietary Intake   Breakfast grits or oatmeal OR cheerios or raisin bran OR meat & eggs    Dinner chinese, fried rice and vegetables,    Snack (evening) apple or cookies OR chips with medicine  Beverage(s) 4 botters water, orange juice occassionally, decaf tea or green tea some honey, 2-3 x/week regular soda      Exercise   Exercise Type ADL's      Patient Education   Previous Diabetes Education Yes (please comment)   2015 at [place] management  Role of diet in the treatment of diabetes and the relationship between the three main macronutrients and blood glucose level    Physical activity and exercise  Role of exercise on diabetes management, blood pressure control and cardiac health.      Individualized Goals (developed by patient)   Nutrition General guidelines for healthy choices and portions discussed;Other (comment)   Increase vegetable intake   Physical Activity Exercise 5-7 days per week      Outcomes   Expected Outcomes Demonstrated interest in learning. Expect positive outcomes    Future DMSE 4-6 wks    Program Status Completed             Individualized Plan for Diabetes Self-Management Training:   Learning Objective:  Patient will have a greater understanding of diabetes self-management. Patient education plan is to attend individual and/or group sessions per assessed needs and concerns.    Patient Instructions  Ways improve health: Increase vegetable intake, use DASH diet handout for servings sizes Balance the non-starchy vegetables and starchy vegetables.  If you are eating a good variety of fruits and vegetables you do not need to worry about taking vitamins. Continue drinking plenty of water Consider getting on your exercise bike daily, even 5 or 10 min. Higher fat foods can increase likelihood of acid indigestion   Expected Outcomes:  Demonstrated interest in learning. Expect positive outcomes  Education material provided: DASH diet brief  If problems or questions, patient to contact team via:  Phone  Future DSME appointment: 4-6 wks

## 2020-06-22 DIAGNOSIS — K317 Polyp of stomach and duodenum: Secondary | ICD-10-CM | POA: Diagnosis not present

## 2020-06-22 DIAGNOSIS — R195 Other fecal abnormalities: Secondary | ICD-10-CM | POA: Diagnosis not present

## 2020-06-22 DIAGNOSIS — K921 Melena: Secondary | ICD-10-CM | POA: Diagnosis not present

## 2020-06-24 ENCOUNTER — Telehealth: Payer: Self-pay

## 2020-06-24 NOTE — Telephone Encounter (Signed)
Can use her singulair and inhalers as usual. Use flonase or astelin nasal spray and can use zyrtec at bedtime.

## 2020-06-24 NOTE — Telephone Encounter (Signed)
I tried to call pt to verify covid screening done but no answer, sending note to Dr Diona Browner and [name] CMA.

## 2020-06-24 NOTE — Telephone Encounter (Signed)
[place] [facility] - Client TELEPHONE ADVICE RECORD [facility] Patient Name: [name] Gender: Female DOB: Mar 23, 1946 Age: 74 Y 10 M 27 D Return Phone Number: [phone] (Primary), [phone] (Secondary) Address: [address] City/State/Zip: [name] [address] Client  [facility] [place] [facility] - Client Client Site [place] - Day Physician [facility] - PHYSICIAN, Verita Schneiders- MD Contact Type Call Who Is Calling Patient / Member / Family / Caregiver Call Type Triage / Clinical Relationship To Patient Self Return Phone Number [phone] (Primary) Chief Complaint Cold Symptom Reason for Call Symptomatic / Request for Health Information Initial Comment Caller states she she needs to contact Dr, her nose is running, and she has different allergies and wants to know what to take. Translation No Nurse Assessment Nurse: Claiborne Billings, RN, Kim Date/Time (Eastern Time): 06/24/2020 11:18:09 AM Confirm and document reason for call. If symptomatic, describe symptoms. ---Caller states she has allergies, since yesterday has been sneezing and today has a runny nose and congestion. No fever. Does the patient have any new or worsening symptoms? ---Yes Will a triage be completed? ---Yes Related visit to physician within the last 2 weeks? ---No Does the PT have any chronic conditions? (i.e. diabetes, asthma, this includes High risk factors for pregnancy, etc.) ---Yes List chronic conditions. ---Allergies, Asthma Is this a behavioral health or substance abuse call? ---No Guidelines Guideline Title Affirmed Question Affirmed Notes Nurse Date/Time Eilene Ghazi Time) Nasal Allergies (Hay Fever) [1] Nasal allergies AND [4] only certain times of year (hay fever) Claiborne Billings, RN, Kim 06/24/2020 11:19:40 AM Disp. Time Eilene Ghazi Time) Disposition Final User 06/24/2020 11:25:10 [place], RN, Max Sane Disagree/Comply Comply Caller  Understands Yes PLEASE NOTE: All timestamps contained within this report are represented as [place] Standard Time. CONFIDENTIALTY NOTICE: This fax transmission is intended only for the addressee. It contains information that is legally privileged, confidential or otherwise protected from use or disclosure. If you are not the intended recipient, you are strictly prohibited from reviewing, disclosing, copying using or disseminating any of this information or taking any action in reliance on or regarding this information. If you have received this fax in error, please notify us immediately by telephone so that we can arrange for its return to [place]. Phone: [phone], Toll-Free: [phone], Fax: [phone] Page: 2 of 2 Call Id: [id number] PreDisposition [place] Advice Given Per Guideline HOME CARE: * You should be able to treat this at home. REASSURANCE AND EDUCATION - HAY FEVER: * It sounds like routine hay fever. * With the right medicine, symptoms can be brought under control. * Here is some care advice that should help. NASAL WASHES FOR A STUFFY NOSE AND TO [place] OUT POLLEN: * Introduction: Saline (salt water) nasal irrigation (nasal wash) is an effective and simple home remedy for treating stuffy nose and sinus congestion. You can also [place] nasal irrigation to wash out pollen and loosen up dried mucus. The nose can be irrigated by pouring, spraying, or squirting salt water into the nose and then letting it run back out. Nasal irrigation consists of pouring, spraying, or squirting salt water into the nose and then letting it run back out. * Methods: There are several ways to irrigate the nose. You can use a saline nasal spray bottle (available over-thecounter), a rubber ear syringe, a medical syringe without the needle, or a NETI POT. ANTIHISTAMINE MEDICINES FOR HAY FEVER: * You can take cetirizine or loratadine by mouth to reduce sneezing, itching  and runny nose. They can help decrease or  stop hay fever and allergy symptoms. * They are over-the-counter (OTC) antihistamine medicines. You can buy them at the drugstore. * CETIRIZINE (REACTINE, ZYRTEC): The adult dose is 10 mg and you take it once a day. Cetirizine is available in the [place] as Zyrtec and in [place] as Reactine. ANTIHISTAMINE MEDICINES - EXTRA NOTES AND WARNINGS: * SECOND GENERATION ANTIHISTAMINES such as cetirizine and loratadine have fewer side effects than first generation antihistamines. Loratadine is one of the least sedating antihistamines. * Diphenhydramine (Benadryl) is a FIRST GENERATION ANTIHISTAMINE medicine. It causes more sleepiness than the newer second generation antihistamine medicines. The adult dosage of Benadryl is 25 to 50 mg by mouth and you can take it up to 4 times a day. CALL BACK IF: * Symptoms aren't controlled in 2 days with continuous antihistamines. * You become worse CARE ADVICE given per Nasal Allergies (Hay Fever) (Adult) guideline.

## 2020-06-24 NOTE — Telephone Encounter (Signed)
I think patient is asking what she can take for her symptoms.

## 2020-06-24 NOTE — Telephone Encounter (Signed)
Noted. If not improving make appt for virtual visit and have COVID test.

## 2020-06-26 ENCOUNTER — Other Ambulatory Visit: Payer: Self-pay | Admitting: Allergy and Immunology

## 2020-06-27 DIAGNOSIS — K317 Polyp of stomach and duodenum: Secondary | ICD-10-CM | POA: Diagnosis not present

## 2020-06-27 NOTE — Telephone Encounter (Signed)
Spoke with patient and gave her instructions with medication treatment as prescribed by Dr. Diona Browner.  Patient states that she has been doing some of this already and started using the [facility] treatment which is helping and she is getting better.   Patient knows to contact us back if she doesn't continue to improve.

## 2020-06-28 ENCOUNTER — Other Ambulatory Visit: Payer: Self-pay | Admitting: Physician Assistant

## 2020-06-28 ENCOUNTER — Telehealth: Payer: Self-pay | Admitting: Cardiovascular Disease

## 2020-06-28 ENCOUNTER — Ambulatory Visit
Admission: RE | Admit: 2020-06-28 | Discharge: 2020-06-28 | Disposition: A | Discharge status: Home / Self Care | Payer: Medicare Other | Source: Ambulatory Visit | Attending: Physician Assistant | Admitting: Physician Assistant

## 2020-06-28 DIAGNOSIS — R198 Other specified symptoms and signs involving the digestive system and abdomen: Secondary | ICD-10-CM

## 2020-06-28 DIAGNOSIS — M47817 Spondylosis without myelopathy or radiculopathy, lumbosacral region: Secondary | ICD-10-CM | POA: Diagnosis not present

## 2020-06-28 DIAGNOSIS — K921 Melena: Secondary | ICD-10-CM | POA: Diagnosis not present

## 2020-06-28 DIAGNOSIS — R197 Diarrhea, unspecified: Secondary | ICD-10-CM | POA: Diagnosis not present

## 2020-06-28 DIAGNOSIS — R194 Change in bowel habit: Secondary | ICD-10-CM | POA: Diagnosis not present

## 2020-06-28 NOTE — Telephone Encounter (Signed)
    Pt would like to speak with a nurse, she said she's having pain in her back down to her right shoulder blade down to her arm. No SOB but she wanted to check in with a nurse

## 2020-06-28 NOTE — Telephone Encounter (Signed)
Returned call to patient-she states she went to the dentist this morning and she believes the way they moved the chair is causing her shoulder pain.  She reports having back pain at baseline.   She states when leaving the dentist she was having throbbing, sharp pain in her right shoulder blade.  She states she went home to rest and placed a heating pad and pain has eased off.  Pain increases with lifting arm or stretching.   She wanted to make sure this wasn't cardiac related.  Denies SOB, dizziness, lightheadedness, N/V/D.    Patient tried to palpate on phone and developed sharp pain in area with movement.  Advised this is likely musculoskeletal since increases with movement and palpation, improved with heat.    Advised to take tylenol to see if improves pain.     Advised if symptoms worsen of develops new symptoms to proceed to ER to be evaluated.  Patient verbalized understanding.

## 2020-07-05 ENCOUNTER — Ambulatory Visit: Payer: TRICARE For Life (TFL)

## 2020-07-05 ENCOUNTER — Ambulatory Visit: Payer: Medicare Other | Admitting: Podiatry

## 2020-07-05 ENCOUNTER — Other Ambulatory Visit: Payer: Self-pay | Admitting: Podiatry

## 2020-07-05 DIAGNOSIS — M5136 Other intervertebral disc degeneration, lumbar region: Secondary | ICD-10-CM | POA: Diagnosis not present

## 2020-07-05 DIAGNOSIS — M1711 Unilateral primary osteoarthritis, right knee: Secondary | ICD-10-CM | POA: Diagnosis not present

## 2020-07-06 ENCOUNTER — Encounter: Payer: Self-pay | Admitting: Podiatry

## 2020-07-06 ENCOUNTER — Other Ambulatory Visit: Payer: Self-pay

## 2020-07-06 ENCOUNTER — Ambulatory Visit (INDEPENDENT_AMBULATORY_CARE_PROVIDER_SITE_OTHER): Payer: Medicare Other | Admitting: Podiatry

## 2020-07-06 DIAGNOSIS — D2371 Other benign neoplasm of skin of right lower limb, including hip: Secondary | ICD-10-CM | POA: Diagnosis not present

## 2020-07-06 DIAGNOSIS — M79676 Pain in unspecified toe(s): Secondary | ICD-10-CM | POA: Diagnosis not present

## 2020-07-06 DIAGNOSIS — E1142 Type 2 diabetes mellitus with diabetic polyneuropathy: Secondary | ICD-10-CM

## 2020-07-06 DIAGNOSIS — B351 Tinea unguium: Secondary | ICD-10-CM | POA: Diagnosis not present

## 2020-07-06 NOTE — Telephone Encounter (Signed)
"  My pharmacy has been holding my Gabapentin for two days.  They said Dr. Milinda Pointer has to call and authorize it.  Can you ask him to do that for me."  I'll let Caryl Pina know.

## 2020-07-06 NOTE — Progress Notes (Signed)
She presents today to the [facility] office chief complaint of painfully elongated toenails as well as painful corns and calluses plantar aspect of bilateral foot.  Objective: Vital signs are stable she is alert and oriented x3 pulses are palpable.  Hammertoe deformities are noted bilateral multiple reactive benign soft tissue lesions are noted to the plantar aspect of the bilateral foot with long thick yellow dystrophic clinically mycotic nails that are painful on palpation as well as debridement.  Assessment: Pain in limb secondary to onychomycosis and benign soft tissue lesions.  Plan: Debrided benign soft tissue lesions bilaterally.  Debrided toenails 1 through 5 bilaterally.  Follow-up with her in 2 to 3 months.

## 2020-07-13 ENCOUNTER — Telehealth: Payer: Self-pay

## 2020-07-13 DIAGNOSIS — J4550 Severe persistent asthma, uncomplicated: Secondary | ICD-10-CM | POA: Diagnosis not present

## 2020-07-13 NOTE — Telephone Encounter (Signed)
Pt called and would like a call back in regards to scheduling her surgery. Pt declined appt as she said she had already been seen.

## 2020-07-14 ENCOUNTER — Ambulatory Visit (INDEPENDENT_AMBULATORY_CARE_PROVIDER_SITE_OTHER): Payer: Medicare Other | Admitting: *Deleted

## 2020-07-14 ENCOUNTER — Other Ambulatory Visit: Payer: Self-pay

## 2020-07-14 DIAGNOSIS — J4550 Severe persistent asthma, uncomplicated: Secondary | ICD-10-CM

## 2020-07-19 ENCOUNTER — Other Ambulatory Visit: Payer: Medicare Other

## 2020-07-19 DIAGNOSIS — R490 Dysphonia: Secondary | ICD-10-CM | POA: Diagnosis not present

## 2020-07-20 ENCOUNTER — Other Ambulatory Visit: Payer: Medicare Other

## 2020-07-21 ENCOUNTER — Other Ambulatory Visit: Payer: Self-pay

## 2020-07-21 ENCOUNTER — Other Ambulatory Visit (INDEPENDENT_AMBULATORY_CARE_PROVIDER_SITE_OTHER): Payer: Medicare Other

## 2020-07-21 ENCOUNTER — Telehealth: Payer: Self-pay | Admitting: Podiatry

## 2020-07-21 DIAGNOSIS — E0500 Thyrotoxicosis with diffuse goiter without thyrotoxic crisis or storm: Secondary | ICD-10-CM

## 2020-07-21 LAB — TSH: TSH: 2.27 u[IU]/mL (ref 0.35–4.50)

## 2020-07-21 LAB — T3, FREE: T3, Free: 2.5 pg/mL (ref 2.3–4.2)

## 2020-07-21 LAB — T4, FREE: Free T4: 1.06 ng/dL (ref 0.60–1.60)

## 2020-07-21 NOTE — Telephone Encounter (Signed)
Patient called in requesting to speak with you directly and has requested return call, Please Advise

## 2020-07-25 ENCOUNTER — Other Ambulatory Visit: Payer: Self-pay

## 2020-07-25 ENCOUNTER — Telehealth: Payer: Self-pay | Admitting: Family Medicine

## 2020-07-25 ENCOUNTER — Telehealth: Payer: Self-pay | Admitting: Podiatry

## 2020-07-25 ENCOUNTER — Telehealth: Payer: Self-pay | Admitting: Internal Medicine

## 2020-07-25 DIAGNOSIS — E0500 Thyrotoxicosis with diffuse goiter without thyrotoxic crisis or storm: Secondary | ICD-10-CM

## 2020-07-25 MED ORDER — FLUCONAZOLE 150 MG PO TABS: 150.0000 mg | ORAL_TABLET | Freq: Once | ORAL | 0 refills | Status: AC

## 2020-07-25 NOTE — Telephone Encounter (Signed)
Will send in an oral antifungal to take 1 tab... repeat in 3 days if not resolved.

## 2020-07-25 NOTE — Telephone Encounter (Signed)
LM for pt to returncall

## 2020-07-25 NOTE — Telephone Encounter (Signed)
Patient called in stating she has started a new medication and that when this happens, she develops a yeast infection. Patient is inquiring to see if a cream that has been called in in the past can be called in again. Would like to discuss with [name] further.

## 2020-07-25 NOTE — Telephone Encounter (Signed)
Ms. [name] notified as instructed by telephone.  Patient states understanding. 

## 2020-07-25 NOTE — Telephone Encounter (Signed)
What symptoms is she having, where, is the med an antibiotic?

## 2020-07-25 NOTE — Telephone Encounter (Signed)
Patient has been calling in requesting to speak with you for almost a week, the patient was made aware that you were out and would follow up once returned, patient has requested a call back, please advise

## 2020-07-25 NOTE — Telephone Encounter (Signed)
Patient was called by a [place] from [facility] regarding lab results.  Patient was returning a call to get her results. Please call her at home line

## 2020-07-25 NOTE — Telephone Encounter (Signed)
Spoke with Ms. Ricklefs.  She states she was put on Prednisone.  Now she has vaginal itching and burning.  She has since stopped the prednisone because she was burning so much.  The cream that was previous prescribed for this by Dr. Diona Browner is not working.

## 2020-07-26 NOTE — Telephone Encounter (Signed)
Make her an appt for a consult-hammer toes-needs xrays

## 2020-07-27 NOTE — Telephone Encounter (Signed)
Pt contacted and results given. Notes are in the Labs tab.

## 2020-07-28 ENCOUNTER — Other Ambulatory Visit: Payer: Self-pay | Admitting: Chiropractic Medicine

## 2020-07-28 DIAGNOSIS — M5459 Other low back pain: Secondary | ICD-10-CM

## 2020-08-01 ENCOUNTER — Ambulatory Visit: Payer: Medicare Other | Admitting: Registered"

## 2020-08-01 ENCOUNTER — Other Ambulatory Visit: Payer: Self-pay | Admitting: Allergy and Immunology

## 2020-08-04 ENCOUNTER — Encounter: Payer: Self-pay | Admitting: Family Medicine

## 2020-08-04 ENCOUNTER — Other Ambulatory Visit: Payer: Self-pay

## 2020-08-04 ENCOUNTER — Ambulatory Visit (INDEPENDENT_AMBULATORY_CARE_PROVIDER_SITE_OTHER): Payer: Medicare Other | Admitting: Family Medicine

## 2020-08-04 VITALS — BP 120/84 | HR 109 | Temp 97.9°F | Ht 63.5 in | Wt 209.0 lb

## 2020-08-04 DIAGNOSIS — L98411 Non-pressure chronic ulcer of buttock limited to breakdown of skin: Secondary | ICD-10-CM | POA: Diagnosis not present

## 2020-08-04 MED ORDER — MUPIROCIN CALCIUM 2 % EX CREA: 1.0000 "application " | TOPICAL_CREAM | Freq: Two times a day (BID) | CUTANEOUS | 0 refills | Status: DC

## 2020-08-04 NOTE — Patient Instructions (Addendum)
Apply topical antibiotics twice daily. Wash daily with warm soapy water, cover with gauze. Call if redness or warmth starts, increased pain or fever.

## 2020-08-04 NOTE — Progress Notes (Signed)
Patient ID: [name], female    DOB: [birth year], 75 y.o.   MRN: [id number]  This visit was conducted in person.  BP 120/84   Pulse (!) 109   Temp 97.9 F (36.6 C) (Temporal)   Ht 5' 3.5" (1.613 m)   Wt 209 lb (94.8 kg)   SpO2 96%   BMI 36.44 kg/m    CC:  Chief Complaint  Patient presents with  . Rash    On buttocks/painful    Subjective:   HPI: [name] is a 75 y.o. female presenting on 08/04/2020 for Rash (On buttocks/painful)  She reports sore bump on buttocks on left buttock..  Noted x 2 days, increase pain  In last 24 hours.  No discharge.  No fever  No MRSA in past.  Having issues with back again.. had diskitis ( was on IV antibiotics through PICC in 01/2020 x 6 weeks)... has appt for MRI on 08/15/19  Oxycodone cause emesis.. change to tylenol with codeine.      Relevant past medical, surgical, family and social history reviewed and updated as indicated. Interim medical history since our last visit reviewed. Allergies and medications reviewed and updated. Outpatient Medications Prior to Visit  Medication Sig Dispense Refill  . acetaminophen (TYLENOL) 650 MG CR tablet Take 1,300 mg by mouth daily as needed for pain.    Marland Kitchen acetaminophen-codeine (TYLENOL #3) 300-30 MG tablet Take 1 tablet by mouth 2 (two) times daily as needed.    Marland Kitchen ADVAIR HFA 230-21 MCG/ACT inhaler INHALE 2 PUFFS BY MOUTH TWICE DAILY TO PREVENT COUGH OR WHEEZING, RINSE MOUTH AFTER USE 12 g 4  . albuterol (VENTOLIN HFA) 108 (90 Base) MCG/ACT inhaler INHALE 2 PUFFS BY MOUTH INTO THE LUNGS EVERY 6 HOURS AS NEEDED FOR WHEEZING OR SHORTNESS OF BREATH 54 g 0  . antiseptic oral rinse (BIOTENE) LIQD 15 mLs by Mouth Rinse route in the morning and at bedtime.    Marland Kitchen Apoaequorin (PREVAGEN) 10 MG CAPS Take 10 mg by mouth daily.    . Artificial Saliva (BIOTENE DRY MOUTH) LOZG Use as directed 1 lozenge in the mouth or throat daily as needed (dry eyes).    Marland Kitchen atorvastatin (LIPITOR) 10 MG tablet Take 1  tablet (10 mg total) by mouth daily. 90 tablet 3  . azelastine (ASTELIN) 0.1 % nasal spray USE 1-2 SPRAYS IN EACH NOSTRIL TWICE DAILY AS NEEDED 30 mL 5  . Capsaicin-Menthol (SALONPAS GEL EX) Apply 1 application topically daily as needed (pain).    . Coenzyme Q10 (COQ-10) 100 MG CAPS Take 100 mg by mouth daily.    . cyclobenzaprine (FLEXERIL) 10 MG tablet TAKE 1 TABLET(10 MG) BY MOUTH AT BEDTIME AS NEEDED FOR MUSCLE SPASMS 30 tablet 1  . dextromethorphan-guaiFENesin (MUCINEX DM) 30-600 MG 12hr tablet Take 1 tablet by mouth 2 (two) times daily as needed for cough.    . Empagliflozin-linaGLIPtin (GLYXAMBI) 10-5 MG TABS Take 1 tablet by mouth daily. 30 tablet 11  . EPINEPHrine 0.3 mg/0.3 mL IJ SOAJ injection USE AS DIRECTED FOR LIFE THREATENING ALLERGIC REACTIONS 2 each 2  . estradiol (ESTRACE) 0.1 MG/GM vaginal cream 0.5 g of cream intravaginally administered daily for 2 weeks, then reduce to twice weekly 42.5 g 12  . famotidine (PEPCID) 40 MG tablet TAKE 1 TABLET(40 MG) BY MOUTH DAILY 30 tablet 3  . fluticasone (FLOVENT HFA) 220 MCG/ACT inhaler INHALE 2 PUFFS BY MOUTH TWICE DAILY DURING FLARE UP AS NEEDED. RINSE, GARGLE AND SPIT AFTER  USE 12 g 1  . gabapentin (NEURONTIN) 600 MG tablet TAKE 1 TABLET BY MOUTH DAILY AT BREAKFAST, 1 TABLET AT LUNCH, AND 2 TABLETS AT BEDTIME 360 tablet 1  . Homeopathic Products [facility] RELIEF EX) Apply 1 spray topically daily as needed (muscle cramps).    . hydrOXYzine (ATARAX/VISTARIL) 10 MG tablet Take 1 tablet (10 mg total) by mouth daily as needed for itching. 15 tablet 0  . ipratropium (ATROVENT) 0.06 % nasal spray Place 1-2 sprays each nostril 1-2 times per day (Patient taking differently: Place 2 sprays into both nostrils 2 (two) times daily as needed for rhinitis.) 15 mL 5  . irbesartan (AVAPRO) 150 MG tablet TAKE 1 TABLET(150 MG) BY MOUTH DAILY 30 tablet 2  . Lactase (DAIRY-RELIEF PO) Take 1 capsule by mouth daily as needed (eating dairy).     Marland Kitchen levalbuterol  (XOPENEX) 1.25 MG/3ML nebulizer solution USE 1 VIAL VIA NEBULIZER EVERY 6 HOURS AS NEEDED FOR WHEEZING OR SHORTNESS OF BREATH 9 mL 1  . meclizine (ANTIVERT) 12.5 MG tablet Take 1 tablet (12.5 mg total) by mouth 3 (three) times daily as needed for dizziness. 15 tablet 0  . methimazole (TAPAZOLE) 5 MG tablet Take 0.5 tablets (2.5 mg total) by mouth daily. 45 tablet 3  . Misc Natural Products (GLUCOSAMINE CHONDROITIN TRIPLE) TABS Take 2 tablets by mouth daily.    . montelukast (SINGULAIR) 10 MG tablet TAKE 1 TABLET(10 MG) BY MOUTH DAILY 90 tablet 1  . nystatin (MYCOSTATIN) 100000 UNIT/ML suspension SWISH AND SWALLOW 5 ML BY MOUTH TWICE DAILY AFTER ADVAIR 60 mL 5  . omalizumab (XOLAIR) 150 MG injection Inject 300 mg into the skin every 28 (twenty-eight) days.    . ondansetron (ZOFRAN-ODT) 4 MG disintegrating tablet Take 4 mg by mouth every 8 (eight) hours as needed for nausea or vomiting.     [name] Delica Lancets 99991111 MISC Use to check blood sugar up to 2 times a day. 100 each 5  . ONETOUCH VERIO test strip Use to check blood sugar up to 2 times a day 100 each 5  . oxybutynin (DITROPAN-XL) 5 MG 24 hr tablet Take 5 mg by mouth daily.    . pantoprazole (PROTONIX) 40 MG tablet TAKE 1 TABLET(40 MG) BY MOUTH DAILY 90 tablet 1  . PATADAY 0.2 % SOLN Place 1 drop into both eyes daily as needed (allergies).   4  . Potassium 99 MG TABS Take 99 mg by mouth daily.    . Psyllium (METAMUCIL FIBER PO) Take by mouth daily as needed.    . Pumpkin Seed-Soy Germ (AZO BLADDER CONTROL/GO-LESS) CAPS Take 1 tablet by mouth 2 (two) times daily.    . Tiotropium Bromide Monohydrate (SPIRIVA RESPIMAT) 1.25 MCG/ACT AERS Inhale 2 puffs into the lungs daily. 4 g 5  . triamcinolone cream (KENALOG) 0.1 % Apply 1 application topically 2 (two) times daily. (Patient taking differently: Apply 1 application topically 2 (two) times daily. Pt takes as needed) 15 g 0  . TRULANCE 3 MG TABS Take 3 mg by mouth daily.     . TURMERIC PO Take  1,000 mg by mouth daily.    . VOLTAREN 1 % GEL Apply 2 g topically 4 (four) times daily. (Patient taking differently: Apply 2 g topically 4 (four) times daily as needed (pain).) 100 g 2  . XARELTO 20 MG TABS tablet TAKE 1 TABLET(20 MG) BY MOUTH DAILY WITH SUPPER 90 tablet 1   Facility-Administered Medications Prior to Visit  Medication Dose  Route Frequency Provider Last Rate Last Admin  . omalizumab Arvid Right) injection 300 mg  300 mg Subcutaneous Q28 days Kozlow, Donnamarie Poag, MD   300 mg at 07/14/20 1053     Per HPI unless specifically indicated in ROS section below Review of Systems Objective:  BP 120/84   Pulse (!) 109   Temp 97.9 F (36.6 C) (Temporal)   Ht 5' 3.5" (1.613 m)   Wt 209 lb (94.8 kg)   SpO2 96%   BMI 36.44 kg/m   Wt Readings from Last 3 Encounters:  08/04/20 209 lb (94.8 kg)  06/03/20 209 lb 12 oz (95.1 kg)  05/31/20 213 lb 12.8 oz (97 kg)      Physical Exam Constitutional:      General: She is not in acute distress.Vital signs are normal.     Appearance: Normal appearance. She is well-developed and well-nourished. She is obese. She is not ill-appearing or toxic-appearing.  HENT:     Head: Normocephalic.     Right Ear: Hearing, tympanic membrane, ear canal and external ear normal. Tympanic membrane is not erythematous, retracted or bulging.     Left Ear: Hearing, tympanic membrane, ear canal and external ear normal. Tympanic membrane is not erythematous, retracted or bulging.     Nose: No mucosal edema or rhinorrhea.     Right Sinus: No maxillary sinus tenderness or frontal sinus tenderness.     Left Sinus: No maxillary sinus tenderness or frontal sinus tenderness.     Mouth/Throat:     Mouth: Oropharynx is clear and moist and mucous membranes are normal.     Pharynx: Uvula midline.  Eyes:     General: Lids are normal. Lids are everted, no foreign bodies appreciated.     Extraocular Movements: EOM normal.     Conjunctiva/sclera: Conjunctivae normal.     Pupils:  Pupils are equal, round, and reactive to light.  Neck:     Thyroid: No thyroid mass or thyromegaly.     Vascular: No carotid bruit.     Trachea: Trachea normal.  Cardiovascular:     Rate and Rhythm: Normal rate and regular rhythm.     Pulses: Normal pulses and intact distal pulses.     Heart sounds: Normal heart sounds, S1 normal and S2 normal. No murmur heard. No friction rub. No gallop.   Pulmonary:     Effort: Pulmonary effort is normal. No tachypnea or respiratory distress.     Breath sounds: Normal breath sounds. No decreased breath sounds, wheezing, rhonchi or rales.  Abdominal:     General: Bowel sounds are normal.     Palpations: Abdomen is soft.     Tenderness: There is no abdominal tenderness.  Musculoskeletal:     Cervical back: Normal range of motion and neck supple.  Skin:    General: Skin is warm, dry and intact.     Findings: No rash.     Comments:  Ulcer and central scab, no fluctuance, no rendess, on left buttock  Neurological:     Mental Status: She is alert.  Psychiatric:        Mood and Affect: Mood is not anxious or depressed.        Speech: Speech normal.        Behavior: Behavior normal. Behavior is cooperative.        Thought Content: Thought content normal.        Cognition and Memory: Cognition and memory normal.        Judgment:  Judgment normal.       Results for orders placed or performed in visit on 07/21/20  TSH  Result Value Ref Range   TSH 2.27 0.35 - 4.50 uIU/mL  T4, free  Result Value Ref Range   Free T4 1.06 0.60 - 1.60 ng/dL  T3, free  Result Value Ref Range   T3, Free 2.5 2.3 - 4.2 pg/mL    This visit occurred during the SARS-CoV-2 public health emergency.  Safety protocols were in place, including screening questions prior to the visit, additional usage of staff PPE, and extensive cleaning of exam room while observing appropriate contact time as indicated for disinfecting solutions.   COVID 19 screen:  No recent travel or known  exposure to COVID19 The patient denies respiratory symptoms of COVID 19 at this time. The importance of social distancing was discussed today.   Assessment and Plan    Problem List Items Addressed This Visit    Skin ulcer of buttock, limited to breakdown of skin ([place]) - Primary     Decubitus versus resolving abscess.  Pt has been more inacitve lately.. sits a lot.   Encouraged activity and not sitting on same spot daily. Will treat with topical mupirocin for possibility of resolving abscess. No current fluctuance or indication for I and D.          [name], MD

## 2020-08-05 DIAGNOSIS — L98411 Non-pressure chronic ulcer of buttock limited to breakdown of skin: Secondary | ICD-10-CM | POA: Insufficient documentation

## 2020-08-05 NOTE — Assessment & Plan Note (Signed)
Decubitus versus resolving abscess.  Pt has been more inacitve lately.. sits a lot.   Encouraged activity and not sitting on same spot daily. Will treat with topical mupirocin for possibility of resolving abscess. No current fluctuance or indication for I and D.

## 2020-08-08 ENCOUNTER — Ambulatory Visit: Payer: Medicare Other | Admitting: Family Medicine

## 2020-08-08 DIAGNOSIS — J4550 Severe persistent asthma, uncomplicated: Secondary | ICD-10-CM | POA: Diagnosis not present

## 2020-08-09 ENCOUNTER — Ambulatory Visit (INDEPENDENT_AMBULATORY_CARE_PROVIDER_SITE_OTHER): Payer: Medicare Other

## 2020-08-09 ENCOUNTER — Ambulatory Visit: Payer: Medicare Other | Admitting: Family Medicine

## 2020-08-09 ENCOUNTER — Other Ambulatory Visit: Payer: Self-pay

## 2020-08-09 DIAGNOSIS — J4550 Severe persistent asthma, uncomplicated: Secondary | ICD-10-CM | POA: Diagnosis not present

## 2020-08-11 ENCOUNTER — Other Ambulatory Visit: Payer: Self-pay | Admitting: Allergy and Immunology

## 2020-08-12 ENCOUNTER — Ambulatory Visit: Payer: Medicare Other | Admitting: Family Medicine

## 2020-08-14 ENCOUNTER — Ambulatory Visit
Admission: RE | Admit: 2020-08-14 | Discharge: 2020-08-14 | Disposition: A | Discharge status: Home / Self Care | Payer: Medicare Other | Source: Ambulatory Visit | Attending: Chiropractic Medicine | Admitting: Chiropractic Medicine

## 2020-08-14 ENCOUNTER — Other Ambulatory Visit: Payer: Self-pay

## 2020-08-14 ENCOUNTER — Other Ambulatory Visit: Payer: Self-pay | Admitting: Cardiovascular Disease

## 2020-08-14 DIAGNOSIS — M5459 Other low back pain: Secondary | ICD-10-CM

## 2020-08-15 ENCOUNTER — Telehealth: Payer: Self-pay | Admitting: Family Medicine

## 2020-08-15 NOTE — Telephone Encounter (Signed)
Pt called in wanted to know if a doctor prescribes her some medicine should she tell Dr. Diona Browner... please advise

## 2020-08-16 ENCOUNTER — Other Ambulatory Visit: Payer: Self-pay

## 2020-08-16 ENCOUNTER — Ambulatory Visit (INDEPENDENT_AMBULATORY_CARE_PROVIDER_SITE_OTHER): Payer: Medicare Other | Admitting: Family Medicine

## 2020-08-16 ENCOUNTER — Encounter: Payer: Self-pay | Admitting: Family Medicine

## 2020-08-16 VITALS — BP 100/60 | HR 63 | Temp 97.5°F | Ht 63.5 in | Wt 209.5 lb

## 2020-08-16 DIAGNOSIS — E1140 Type 2 diabetes mellitus with diabetic neuropathy, unspecified: Secondary | ICD-10-CM

## 2020-08-16 DIAGNOSIS — L98411 Non-pressure chronic ulcer of buttock limited to breakdown of skin: Secondary | ICD-10-CM

## 2020-08-16 DIAGNOSIS — G8929 Other chronic pain: Secondary | ICD-10-CM | POA: Diagnosis not present

## 2020-08-16 DIAGNOSIS — M545 Low back pain, unspecified: Secondary | ICD-10-CM | POA: Diagnosis not present

## 2020-08-16 DIAGNOSIS — E119 Type 2 diabetes mellitus without complications: Secondary | ICD-10-CM | POA: Diagnosis not present

## 2020-08-16 MED ORDER — ACETAMINOPHEN-CODEINE #3 300-30 MG PO TABS: 2.0000 | ORAL_TABLET | Freq: Three times a day (TID) | ORAL | 0 refills | Status: AC | PRN

## 2020-08-16 MED ORDER — ONETOUCH VERIO W/DEVICE KIT: PACK | 0 refills | Status: DC

## 2020-08-16 NOTE — Progress Notes (Signed)
Patient ID: [name], female    DOB: December 18, 1945, 75 y.o.   MRN: [id number]  This visit was conducted in person.  BP 100/60   Pulse 63   Temp (!) 97.5 F (36.4 C) (Temporal)   Ht 5' 3.5" (1.613 m)   Wt 209 lb 8 oz (95 kg)   SpO2 97%   BMI 36.53 kg/m    CC:  Chief Complaint  Patient presents with  . Follow-up    Skin Ulcer   . Back Pain    Subjective:   HPI: [name] is a 75 y.o. female presenting on 08/16/2020 for Follow-up (Skin Ulcer/) and Back Pain   1. Decubitus skin ulcer on left buttock.  At last OV treated with mupirocin ointment daily for possible resolving abcess/cellulitis She reports today  That it has improved.  No pain, no discharge, no fever.    2. Chronic low back pain: porly controlled. No recent falls.  She plans to return to back specialist for re-eval given history of diskitis. She is requesting med for better pain control today.  Relevant past medical, surgical, family and social history reviewed and updated as indicated. Interim medical history since our last visit reviewed. Allergies and medications reviewed and updated. Outpatient Medications Prior to Visit  Medication Sig Dispense Refill  . acetaminophen (TYLENOL) 650 MG CR tablet Take 1,300 mg by mouth daily as needed for pain.    Marland Kitchen acetaminophen-codeine (TYLENOL #3) 300-30 MG tablet Take 1 tablet by mouth 2 (two) times daily as needed.    Marland Kitchen ADVAIR HFA 230-21 MCG/ACT inhaler INHALE 2 PUFFS BY MOUTH TWICE DAILY TO PREVENT COUGH OR WHEEZING, RINSE MOUTH AFTER USE 12 g 4  . albuterol (VENTOLIN HFA) 108 (90 Base) MCG/ACT inhaler INHALE 2 PUFFS BY MOUTH INTO THE LUNGS EVERY 6 HOURS AS NEEDED FOR WHEEZING OR SHORTNESS OF BREATH 54 g 0  . antiseptic oral rinse (BIOTENE) LIQD 15 mLs by Mouth Rinse route in the morning and at bedtime.    Marland Kitchen Apoaequorin (PREVAGEN) 10 MG CAPS Take 10 mg by mouth daily.    . Artificial Saliva (BIOTENE DRY MOUTH) LOZG Use as directed 1 lozenge in the mouth or  throat daily as needed (dry eyes).    Marland Kitchen atorvastatin (LIPITOR) 10 MG tablet Take 1 tablet (10 mg total) by mouth daily. 90 tablet 3  . azelastine (ASTELIN) 0.1 % nasal spray USE 1-2 SPRAYS IN EACH NOSTRIL TWICE DAILY AS NEEDED 30 mL 5  . Capsaicin-Menthol (SALONPAS GEL EX) Apply 1 application topically daily as needed (pain).    . Coenzyme Q10 (COQ-10) 100 MG CAPS Take 100 mg by mouth daily.    . cyclobenzaprine (FLEXERIL) 10 MG tablet TAKE 1 TABLET(10 MG) BY MOUTH AT BEDTIME AS NEEDED FOR MUSCLE SPASMS 30 tablet 1  . dextromethorphan-guaiFENesin (MUCINEX DM) 30-600 MG 12hr tablet Take 1 tablet by mouth 2 (two) times daily as needed for cough.    . Empagliflozin-linaGLIPtin (GLYXAMBI) 10-5 MG TABS Take 1 tablet by mouth daily. 30 tablet 11  . EPINEPHrine 0.3 mg/0.3 mL IJ SOAJ injection USE AS DIRECTED FOR LIFE THREATENING ALLERGIC REACTIONS 2 each 2  . estradiol (ESTRACE) 0.1 MG/GM vaginal cream 0.5 g of cream intravaginally administered daily for 2 weeks, then reduce to twice weekly 42.5 g 12  . famotidine (PEPCID) 40 MG tablet TAKE 1 TABLET(40 MG) BY MOUTH DAILY 30 tablet 3  . Ferrous Sulfate (IRON) 325 (65 Fe) MG TABS Take 1 tablet by  mouth daily.    . fluticasone (FLOVENT HFA) 220 MCG/ACT inhaler INHALE 2 PUFFS BY MOUTH TWICE DAILY DURING FLARE UP AS NEEDED. RINSE, GARGLE AND SPIT AFTER USE 12 g 1  . gabapentin (NEURONTIN) 600 MG tablet TAKE 1 TABLET BY MOUTH DAILY AT BREAKFAST, 1 TABLET AT LUNCH, AND 2 TABLETS AT BEDTIME 360 tablet 1  . Garlic (GARLIQUE) 993 MG TBEC Take 1 tablet by mouth daily.    . Homeopathic Products [facility] RELIEF EX) Apply 1 spray topically daily as needed (muscle cramps).    . hydrOXYzine (ATARAX/VISTARIL) 10 MG tablet Take 1 tablet (10 mg total) by mouth daily as needed for itching. 15 tablet 0  . irbesartan (AVAPRO) 150 MG tablet TAKE 1 TABLET(150 MG) BY MOUTH DAILY 30 tablet 2  . Lactase (DAIRY-RELIEF PO) Take 1 capsule by mouth daily as needed (eating dairy).      Marland Kitchen levalbuterol (XOPENEX) 1.25 MG/3ML nebulizer solution USE 1 VIAL VIA NEBULIZER EVERY 6 HOURS AS NEEDED FOR WHEEZING OR SHORTNESS OF BREATH 9 mL 1  . meclizine (ANTIVERT) 12.5 MG tablet Take 1 tablet (12.5 mg total) by mouth 3 (three) times daily as needed for dizziness. 15 tablet 0  . methimazole (TAPAZOLE) 5 MG tablet Take 0.5 tablets (2.5 mg total) by mouth daily. 45 tablet 3  . Misc Natural Products (GLUCOSAMINE CHONDROITIN TRIPLE) TABS Take 2 tablets by mouth daily.    . montelukast (SINGULAIR) 10 MG tablet TAKE 1 TABLET(10 MG) BY MOUTH DAILY 90 tablet 1  . mupirocin cream (BACTROBAN) 2 % Apply 1 application topically 2 (two) times daily. 15 g 0  . omalizumab (XOLAIR) 150 MG injection Inject 300 mg into the skin every 28 (twenty-eight) days.    . ondansetron (ZOFRAN-ODT) 4 MG disintegrating tablet Take 4 mg by mouth every 8 (eight) hours as needed for nausea or vomiting.     [name] Delica Lancets 71I MISC Use to check blood sugar up to 2 times a day. 100 each 5  . ONETOUCH VERIO test strip Use to check blood sugar up to 2 times a day 100 each 5  . oxybutynin (DITROPAN-XL) 5 MG 24 hr tablet Take 5 mg by mouth daily.    . pantoprazole (PROTONIX) 40 MG tablet TAKE 1 TABLET(40 MG) BY MOUTH DAILY 90 tablet 1  . PATADAY 0.2 % SOLN Place 1 drop into both eyes daily as needed (allergies).   4  . Potassium 99 MG TABS Take 99 mg by mouth daily.    . Tiotropium Bromide Monohydrate (SPIRIVA RESPIMAT) 1.25 MCG/ACT AERS Inhale 2 puffs into the lungs daily. 4 g 5  . tolterodine (DETROL) 2 MG tablet Take 2 mg by mouth daily.    . TRULANCE 3 MG TABS Take 3 mg by mouth daily.     . TURMERIC PO Take 1,000 mg by mouth daily.    . VOLTAREN 1 % GEL Apply 2 g topically 4 (four) times daily. (Patient taking differently: Apply 2 g topically 4 (four) times daily as needed (pain).) 100 g 2  . XARELTO 20 MG TABS tablet TAKE 1 TABLET(20 MG) BY MOUTH DAILY WITH SUPPER 90 tablet 1  . ipratropium (ATROVENT) 0.06 %  nasal spray Place 1-2 sprays each nostril 1-2 times per day (Patient taking differently: Place 2 sprays into both nostrils 2 (two) times daily as needed for rhinitis.) 15 mL 5  . triamcinolone cream (KENALOG) 0.1 % Apply 1 application topically 2 (two) times daily. (Patient taking differently: Apply 1 application  topically 2 (two) times daily. Pt takes as needed) 15 g 0  . nystatin (MYCOSTATIN) 100000 UNIT/ML suspension SWISH AND SWALLOW 5 ML BY MOUTH TWICE DAILY AFTER ADVAIR 60 mL 5  . Psyllium (METAMUCIL FIBER PO) Take by mouth daily as needed.    . Pumpkin Seed-Soy Germ (AZO BLADDER CONTROL/GO-LESS) CAPS Take 1 tablet by mouth 2 (two) times daily.     Facility-Administered Medications Prior to Visit  Medication Dose Route Frequency Provider Last Rate Last Admin  . omalizumab Arvid Right) injection 300 mg  300 mg Subcutaneous Q28 days [name], MD   300 mg at 08/09/20 1015     Per HPI unless specifically indicated in ROS section below Review of Systems  Constitutional: Negative for fatigue and fever.  HENT: Negative for congestion.   Eyes: Negative for pain.  Respiratory: Negative for cough and shortness of breath.   Cardiovascular: Negative for chest pain, palpitations and leg swelling.  Gastrointestinal: Negative for abdominal pain.  Genitourinary: Negative for dysuria and vaginal bleeding.  Musculoskeletal: Positive for back pain.  Neurological: Negative for syncope, light-headedness and headaches.  Psychiatric/Behavioral: Negative for dysphoric mood.   Objective:  BP 100/60   Pulse 63   Temp (!) 97.5 F (36.4 C) (Temporal)   Ht 5' 3.5" (1.613 m)   Wt 209 lb 8 oz (95 kg)   SpO2 97%   BMI 36.53 kg/m   Wt Readings from Last 3 Encounters:  08/16/20 209 lb 8 oz (95 kg)  08/04/20 209 lb (94.8 kg)  06/03/20 209 lb 12 oz (95.1 kg)      Physical Exam Constitutional:      General: She is not in acute distress.    Appearance: Normal appearance. She is well-developed. She is  obese. She is not ill-appearing or toxic-appearing.  HENT:     Head: Normocephalic.     Right Ear: Hearing, tympanic membrane, ear canal and external ear normal. Tympanic membrane is not erythematous, retracted or bulging.     Left Ear: Hearing, tympanic membrane, ear canal and external ear normal. Tympanic membrane is not erythematous, retracted or bulging.     Nose: No mucosal edema or rhinorrhea.     Right Sinus: No maxillary sinus tenderness or frontal sinus tenderness.     Left Sinus: No maxillary sinus tenderness or frontal sinus tenderness.     Mouth/Throat:     Pharynx: Uvula midline.  Eyes:     General: Lids are normal. Lids are everted, no foreign bodies appreciated.     Conjunctiva/sclera: Conjunctivae normal.     Pupils: Pupils are equal, round, and reactive to light.  Neck:     Thyroid: No thyroid mass or thyromegaly.     Vascular: No carotid bruit.     Trachea: Trachea normal.  Cardiovascular:     Rate and Rhythm: Normal rate and regular rhythm.     Pulses: Normal pulses.     Heart sounds: Normal heart sounds, S1 normal and S2 normal. No murmur heard. No friction rub. No gallop.   Pulmonary:     Effort: Pulmonary effort is normal. No tachypnea or respiratory distress.     Breath sounds: Normal breath sounds. No decreased breath sounds, wheezing, rhonchi or rales.  Abdominal:     General: Bowel sounds are normal.     Palpations: Abdomen is soft.     Tenderness: There is no abdominal tenderness.  Musculoskeletal:     Cervical back: Normal range of motion and neck supple.  Thoracic back: Tenderness present. No bony tenderness. Decreased range of motion.     Lumbar back: Tenderness present. No bony tenderness. Decreased range of motion. Negative right straight leg raise test and negative left straight leg raise test.  Skin:    General: Skin is warm and dry.     Findings: No rash.     Comments:  Ulcer and central scab, no fluctuance, no rendess, on left buttock   Neurological:     Mental Status: She is alert.  Psychiatric:        Mood and Affect: Mood is not anxious or depressed.        Speech: Speech normal.        Behavior: Behavior normal. Behavior is cooperative.        Thought Content: Thought content normal.        Judgment: Judgment normal.       Results for orders placed or performed in visit on 07/21/20  TSH  Result Value Ref Range   TSH 2.27 0.35 - 4.50 uIU/mL  T4, free  Result Value Ref Range   Free T4 1.06 0.60 - 1.60 ng/dL  T3, free  Result Value Ref Range   T3, Free 2.5 2.3 - 4.2 pg/mL    This visit occurred during the SARS-CoV-2 public health emergency.  Safety protocols were in place, including screening questions prior to the visit, additional usage of staff PPE, and extensive cleaning of exam room while observing appropriate contact time as indicated for disinfecting solutions.   COVID 19 screen:  No recent travel or known exposure to COVID19 The patient denies respiratory symptoms of COVID 19 at this time. The importance of social distancing was discussed today.   Assessment and Plan  Problem List Items Addressed This Visit    Chronic low back pain without sciatica    PDMP reviewed.. no red flags.  Refilled Rx for tyelynol with codeine.      Controlled type 2 diabetes mellitus without complication (HCC)    rx  Glucometer sent      Diabetes mellitus with neuropathy ([place]) - Primary   Relevant Medications   Blood Glucose Monitoring Suppl (ONETOUCH VERIO) w/Device KIT   Skin ulcer of buttock, limited to breakdown of skin ([place])    Improving, no active infection.         Meds ordered this encounter  Medications  . acetaminophen-codeine (TYLENOL #3) 300-30 MG tablet    Sig: Take 2 tablets by mouth every 8 (eight) hours as needed.    Dispense:  180 tablet    Refill:  0  . Blood Glucose Monitoring Suppl (ONETOUCH VERIO) w/Device KIT    Sig: Use to check blood sugar up to 2 times a day    Dispense:  1 kit     Refill:  0     [name], MD

## 2020-08-16 NOTE — Telephone Encounter (Signed)
Patient is scheduled to see Dr. Diona Browner on 08/16/2020.  Will address this at that time.

## 2020-08-18 ENCOUNTER — Other Ambulatory Visit: Payer: Self-pay | Admitting: Chiropractic Medicine

## 2020-08-18 ENCOUNTER — Telehealth: Payer: Self-pay | Admitting: Family Medicine

## 2020-08-18 DIAGNOSIS — M5459 Other low back pain: Secondary | ICD-10-CM

## 2020-08-18 NOTE — Telephone Encounter (Signed)
Patient is requesting a call back in regards to the medicine she is going to take before her mri.   Medicine:Diezepam 10mg 

## 2020-08-18 NOTE — Telephone Encounter (Signed)
Spoke with [name].  She is asking when she should take the diazepam.  I advised to take the diazepam 30 minutes prior to her MRI and that she will need someone the drive her to that appointment.  Patient states understanding.

## 2020-08-20 ENCOUNTER — Other Ambulatory Visit: Payer: Self-pay | Admitting: Family Medicine

## 2020-08-22 ENCOUNTER — Other Ambulatory Visit: Payer: Self-pay | Admitting: Family Medicine

## 2020-08-25 NOTE — Telephone Encounter (Signed)
I am calling to see if you want to schedule another appointment with Dr. Milinda Pointer to discuss surgery.  "I've already got an appointment scheduled for the March 18."  I saw that, so you'll just discuss it with him then, correct?  "Yes, I'll discuss it then.  I'm not in a hurry."

## 2020-08-29 ENCOUNTER — Other Ambulatory Visit: Payer: Self-pay | Admitting: Cardiovascular Disease

## 2020-08-29 NOTE — Telephone Encounter (Signed)
75f, 95kg, scr 1.04 01/04/20, lovw/croitoru 05/26/20, ccr 84 refill received for xarelto 20mg  refill granted

## 2020-09-01 ENCOUNTER — Telehealth: Payer: Self-pay | Admitting: Cardiovascular Disease

## 2020-09-01 NOTE — Telephone Encounter (Signed)
° °  Pt c/o medication issue:  1. Name of Medication: XARELTO 20 MG TABS tablet  2. How are you currently taking this medication (dosage and times per day)? TAKE 1 TABLET(20 MG) BY MOUTH DAILY WITH SUPPER  3. Are you having a reaction (difficulty breathing--STAT)?   4. What is your medication issue? Pt said she took 2 xarelto yesterday by mistake, she would like to know if she can skip her dose today

## 2020-09-01 NOTE — Telephone Encounter (Signed)
Spoke with patient and relayed Pharm D's recommendations. Patient verbalized understanding.

## 2020-09-01 NOTE — Telephone Encounter (Signed)
Yes, recommend she skip tonight's dose and resume normal dosing schedule tomorrow

## 2020-09-04 ENCOUNTER — Ambulatory Visit
Admission: RE | Admit: 2020-09-04 | Discharge: 2020-09-04 | Disposition: A | Discharge status: Home / Self Care | Payer: Medicare Other | Source: Ambulatory Visit | Attending: Chiropractic Medicine | Admitting: Chiropractic Medicine

## 2020-09-04 ENCOUNTER — Other Ambulatory Visit: Payer: Self-pay

## 2020-09-04 DIAGNOSIS — M5459 Other low back pain: Secondary | ICD-10-CM

## 2020-09-04 DIAGNOSIS — M545 Low back pain, unspecified: Secondary | ICD-10-CM | POA: Diagnosis not present

## 2020-09-04 DIAGNOSIS — M48061 Spinal stenosis, lumbar region without neurogenic claudication: Secondary | ICD-10-CM | POA: Diagnosis not present

## 2020-09-05 DIAGNOSIS — J4540 Moderate persistent asthma, uncomplicated: Secondary | ICD-10-CM

## 2020-09-06 ENCOUNTER — Ambulatory Visit (INDEPENDENT_AMBULATORY_CARE_PROVIDER_SITE_OTHER): Payer: Medicare Other | Admitting: *Deleted

## 2020-09-06 ENCOUNTER — Other Ambulatory Visit: Payer: Self-pay

## 2020-09-06 DIAGNOSIS — J4540 Moderate persistent asthma, uncomplicated: Secondary | ICD-10-CM

## 2020-09-06 DIAGNOSIS — M65841 Other synovitis and tenosynovitis, right hand: Secondary | ICD-10-CM | POA: Diagnosis not present

## 2020-09-06 DIAGNOSIS — H04123 Dry eye syndrome of bilateral lacrimal glands: Secondary | ICD-10-CM | POA: Diagnosis not present

## 2020-09-06 DIAGNOSIS — H40013 Open angle with borderline findings, low risk, bilateral: Secondary | ICD-10-CM | POA: Diagnosis not present

## 2020-09-06 DIAGNOSIS — M65341 Trigger finger, right ring finger: Secondary | ICD-10-CM | POA: Diagnosis not present

## 2020-09-08 ENCOUNTER — Telehealth: Payer: Self-pay | Admitting: Family Medicine

## 2020-09-08 DIAGNOSIS — M5459 Other low back pain: Secondary | ICD-10-CM | POA: Diagnosis not present

## 2020-09-08 NOTE — Telephone Encounter (Signed)
Let her know when she calls back that she can do the  podiatry labs at [facility].

## 2020-09-08 NOTE — Telephone Encounter (Signed)
Pt called in wanted to know about getting set up for a lab appointment from another doctor and wanted to discuss this with her.

## 2020-09-08 NOTE — Telephone Encounter (Signed)
Spoke with [name].  She states that her orthopedist is wanting her to have some labs drawn and he gave her a paper with orders on it so she was wondering if she could come here to have those labs drawn.  I advised that we are unable to do labs for other doctor offices unless they are a [place] provider.    She then ask if Dr. Diona Browner would order some lab test to check her hormones.   I ask her exactly what she was wanted checked and she said her daughter had recommended same lab test due to her being fatigued all the time but she can't remember the name of the labs.  She will check with her daughter and call us back.

## 2020-09-08 NOTE — Telephone Encounter (Signed)
Per office policy and Terri in our lab, only if it is a Interior and spatial designer and the orders are in [place],  can draw labs here.  Left message for Ms. Heap to return my call.

## 2020-09-10 ENCOUNTER — Other Ambulatory Visit: Payer: Self-pay | Admitting: Cardiovascular Disease

## 2020-09-14 ENCOUNTER — Ambulatory Visit: Payer: Medicare Other | Admitting: Podiatry

## 2020-09-14 DIAGNOSIS — M545 Low back pain, unspecified: Secondary | ICD-10-CM | POA: Diagnosis not present

## 2020-09-14 DIAGNOSIS — R198 Other specified symptoms and signs involving the digestive system and abdomen: Secondary | ICD-10-CM | POA: Diagnosis not present

## 2020-09-19 ENCOUNTER — Telehealth: Payer: Self-pay | Admitting: Family Medicine

## 2020-09-19 DIAGNOSIS — M545 Low back pain, unspecified: Secondary | ICD-10-CM

## 2020-09-19 DIAGNOSIS — M4647 Discitis, unspecified, lumbosacral region: Secondary | ICD-10-CM

## 2020-09-19 DIAGNOSIS — G8929 Other chronic pain: Secondary | ICD-10-CM

## 2020-09-19 NOTE — Telephone Encounter (Signed)
Pt called in wanted to know if Mrs. [name] would call her its about medications

## 2020-09-20 NOTE — Telephone Encounter (Signed)
Left message for [name] to return my call.

## 2020-09-20 NOTE — Telephone Encounter (Signed)
Spoke with Ms. Cerutti.  She is wanting to know if Dr. Diona Browner would put a referral in for a 2nd opinion for her back with a neurosurgeon at [facility].  Please Advise.

## 2020-09-20 NOTE — Telephone Encounter (Signed)
Referral placed.

## 2020-09-20 NOTE — Addendum Note (Signed)
Addended by: [name] E on: 09/20/2020 05:40 PM   Modules accepted: Orders

## 2020-09-23 IMAGING — CT CT L SPINE W/O CM
1 of 6 series · 6 of 14 positions shown, 8 images · non-contrast
Comparison: Postmyelogram CT lumbar spine 04/10/2018. MRI lumbar
spine 12/05/2019.

CLINICAL DATA: Low back pain radiating into both hips. History of
discitis and osteomyelitis at L4-5 and L5-S1.

EXAM:
CT LUMBAR SPINE WITHOUT CONTRAST
TECHNIQUE: Multidetector CT imaging of the lumbar spine was performed without
intravenous contrast administration. Multiplanar CT image
reconstructions were also generated.

[Series 2: l spine soft · axial · 0.33mm/px · z∈[-290,-122]mm · 6 of 80 slices shown, 8 images]
[im 12/80  soft-tissue]
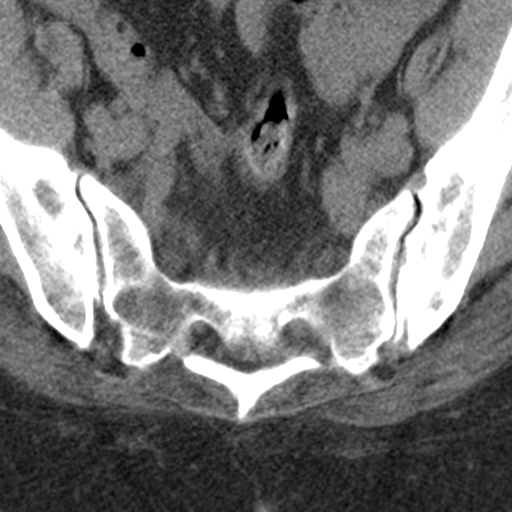
[im 12/80  bone]
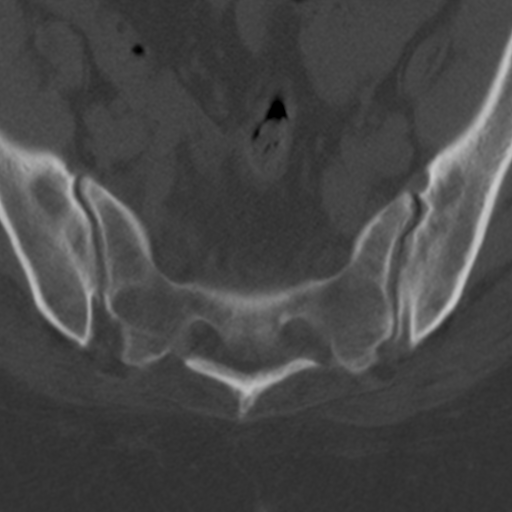
[im 23/80  bone]
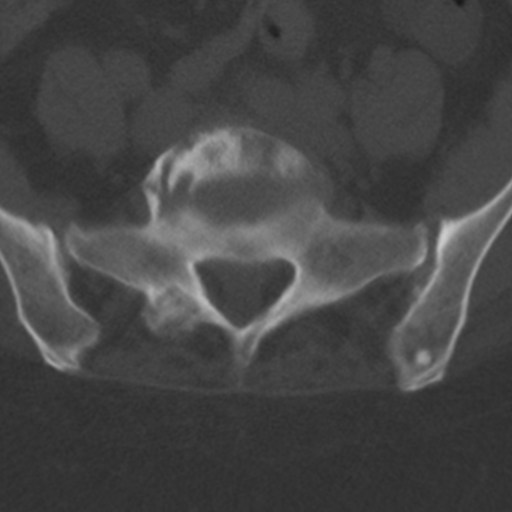
[im 34/80  bone]
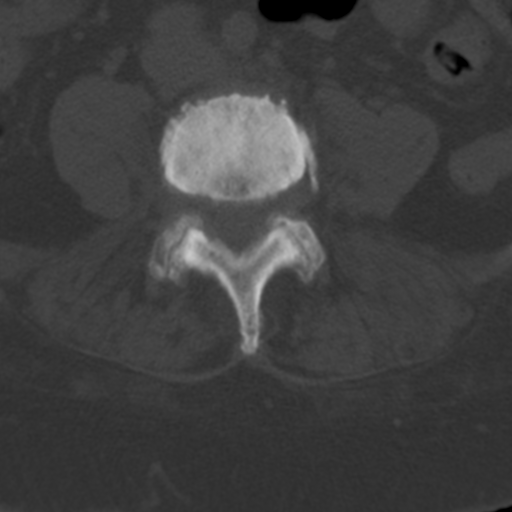
[im 46/80  bone]
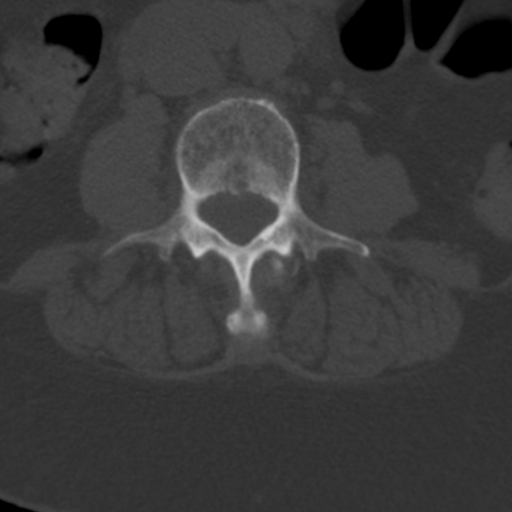
[im 57/80  soft-tissue]
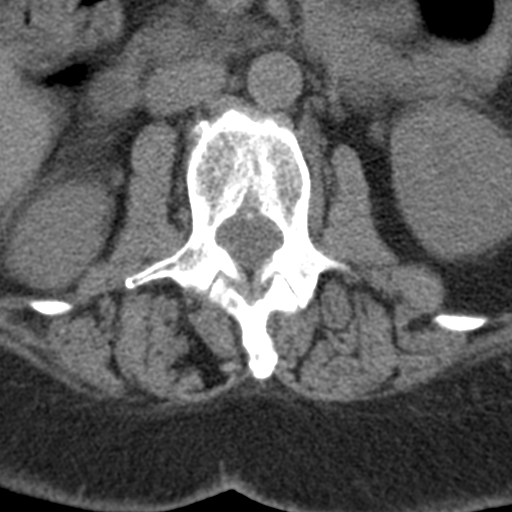
[im 57/80  bone]
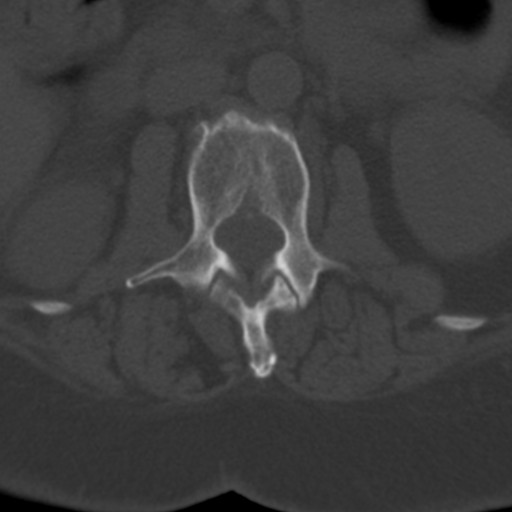
[im 68/80  bone]
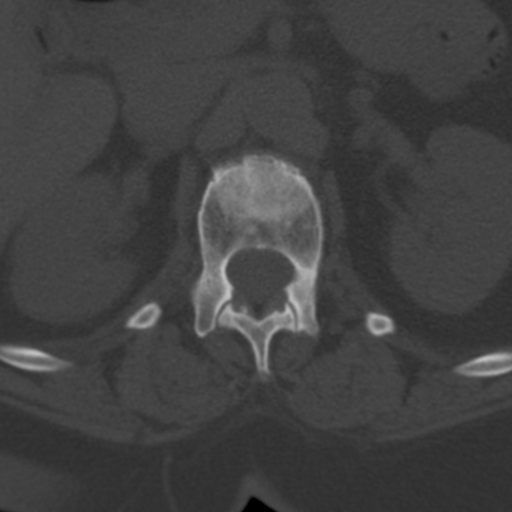

[6 of 14 positions shown; findings below may reference images not displayed]

FINDINGS: Segmentation: Standard.

Alignment: 0.3 cm anterolisthesis L4 on L5 is unchanged.

Vertebrae: No fracture is identified. There is severe endplate
sclerosis at L4-5 and L5-S1 with irregularity of the endplates at
both levels worrisome for discitis and osteomyelitis as seen on
prior MRI. Sclerosis and irregularity of the endplates are much
worse than on the prior CT. Also seen is a Schmorl's node in the
inferior endplate of L1 and marked sclerosis in the endplate, worse
since the priors CT.

Paraspinal and other soft tissues: Mild soft tissue thickening is
present about the L4-5 and L5-S1 disc interspaces. Presacral edema
is much better visualized on the prior MRI. Atherosclerosis is
noted.

Disc levels: T12-L1: Negative.

L1-2: Shallow disc bulge and mild facet degenerative change without
stenosis.

L2-3: Shallow disc bulge and mild-to-moderate facet degenerative
change without stenosis.

L3-4: Shallow disc bulge, ligamentum flavum thickening and mild to
moderate facet arthropathy without stenosis.

L4-5: Loss of disc space height, ligamentum flavum thickening and
facet degenerative change. Moderate central canal and subarticular
recess narrowing. Moderate bilateral foraminal narrowing. The
appearance is unchanged since the prior MRI.

L5-S1: Bilateral facet degenerative change. Mild central canal and
bilateral lateral recess narrowing again seen. Moderate bilateral
foraminal narrowing is also unchanged.
IMPRESSION: Findings worrisome for discitis and osteomyelitis at L4-5 and L5-S1
as reported on prior MRI.

No change in lumbar spondylosis since the prior MRI.

## 2020-09-26 ENCOUNTER — Encounter: Payer: Self-pay | Admitting: Podiatry

## 2020-09-26 ENCOUNTER — Ambulatory Visit (INDEPENDENT_AMBULATORY_CARE_PROVIDER_SITE_OTHER): Payer: Medicare Other

## 2020-09-26 ENCOUNTER — Ambulatory Visit (INDEPENDENT_AMBULATORY_CARE_PROVIDER_SITE_OTHER): Payer: Medicare Other | Admitting: Podiatry

## 2020-09-26 ENCOUNTER — Other Ambulatory Visit: Payer: Self-pay

## 2020-09-26 DIAGNOSIS — M2042 Other hammer toe(s) (acquired), left foot: Secondary | ICD-10-CM

## 2020-09-26 DIAGNOSIS — M2041 Other hammer toe(s) (acquired), right foot: Secondary | ICD-10-CM | POA: Diagnosis not present

## 2020-09-26 NOTE — Progress Notes (Signed)
She presents today for a consult she is wondering if she could should have surgery of her left foot shorten her toes on her left side to make the toes on the right side.  Objective: Vital signs are stable she is alert oriented x3.  Pulses are palpable.  She has flexible hammertoe deformities of toes 2 3 and 4 of the left foot after 1/5 metatarsal osteotomy this performed some years ago.  She states that she really does not want to have anything done to the hallux she wants the other toe shortened.    Assessment: Hammertoe deformity.  Plan: Follow-up with Dr.Galaway for routine care of calluses.  I explained to her that is not smart to perform this type of procedure to these toes just to shorten the toes to make an equal with the other foot.  Also explained to her that her hallux is longer than any of her other toes and that at her age definitely would not do anything to alter the efficacy of the hallux.

## 2020-09-27 DIAGNOSIS — G894 Chronic pain syndrome: Secondary | ICD-10-CM | POA: Diagnosis not present

## 2020-09-27 DIAGNOSIS — M25561 Pain in right knee: Secondary | ICD-10-CM | POA: Diagnosis not present

## 2020-09-27 DIAGNOSIS — M5136 Other intervertebral disc degeneration, lumbar region: Secondary | ICD-10-CM | POA: Diagnosis not present

## 2020-10-02 ENCOUNTER — Telehealth: Payer: Self-pay | Admitting: Allergy & Immunology

## 2020-10-02 NOTE — Telephone Encounter (Signed)
Patient contacted me this evening and she has been wheezing today. She is on Xopenex. She is wondering whether she can use two of them at one time. I recommended that she double up and use two every 4 hours for the next 24 hours to get her through this current exacerbation. She thinks that this is related to the weather. She has remained afebrile.   She has been using Advair and Spiriva for her controller medications. She is also on Xolair. She does not think that she needs systemic steroids at this point.   We will call her tomorrow to see how she is doing.   Salvatore Marvel, MD [facility] [place]

## 2020-10-03 NOTE — Telephone Encounter (Signed)
Spoke with patient, She stated that she is breathing a little better today and she isn't wheezing as much as she was. I asked if she felt like she needed steroids today and she stated no. Patient will call the office if she gets worse or isn't getting any better.

## 2020-10-04 ENCOUNTER — Ambulatory Visit: Payer: TRICARE For Life (TFL)

## 2020-10-04 DIAGNOSIS — M65341 Trigger finger, right ring finger: Secondary | ICD-10-CM | POA: Diagnosis not present

## 2020-10-04 DIAGNOSIS — M65841 Other synovitis and tenosynovitis, right hand: Secondary | ICD-10-CM | POA: Diagnosis not present

## 2020-10-05 ENCOUNTER — Telehealth: Payer: Self-pay | Admitting: Cardiovascular Disease

## 2020-10-05 NOTE — Telephone Encounter (Signed)
Attempted to contact pt but phone continues to ring.

## 2020-10-05 NOTE — Telephone Encounter (Signed)
Pt c/o medication issue:  1. Name of Medication:  Nucynta 50 mg tablet  2. How are you currently taking this medication (dosage and times per day)? 1 tablet a day  3. Are you having a reaction (difficulty breathing--STAT)? no  4. What is your medication issue? Patient called to say that another Dr. Has prescribe this medication for her and she wants to verify with Dr Crotioru that its okay for her to take. Please advise

## 2020-10-06 DIAGNOSIS — K219 Gastro-esophageal reflux disease without esophagitis: Secondary | ICD-10-CM | POA: Diagnosis not present

## 2020-10-06 DIAGNOSIS — J4550 Severe persistent asthma, uncomplicated: Secondary | ICD-10-CM | POA: Diagnosis not present

## 2020-10-06 DIAGNOSIS — J3089 Other allergic rhinitis: Secondary | ICD-10-CM | POA: Diagnosis not present

## 2020-10-06 NOTE — Telephone Encounter (Signed)
Patient is returning call.  °

## 2020-10-06 NOTE — Telephone Encounter (Signed)
Pt report a PA recently prescribed Nucynta 50 mg BID PRN for arthritis and wanted to see if it was ok to take.   Will forward to PharmD for recommendations.

## 2020-10-06 NOTE — Telephone Encounter (Signed)
Pt updated and verbalized understanding.  

## 2020-10-06 NOTE — Telephone Encounter (Signed)
Left message to call back  

## 2020-10-06 NOTE — Telephone Encounter (Signed)
Okay to take Nucynta as prescribed. May cause some drop in blood pressure, but not contraindicated with any of your cardiac medication or diagnosis.

## 2020-10-06 NOTE — Telephone Encounter (Signed)
Patient returning call.

## 2020-10-07 ENCOUNTER — Other Ambulatory Visit: Payer: Self-pay

## 2020-10-07 ENCOUNTER — Ambulatory Visit (INDEPENDENT_AMBULATORY_CARE_PROVIDER_SITE_OTHER): Payer: Medicare Other | Admitting: Allergy

## 2020-10-07 ENCOUNTER — Encounter: Payer: Self-pay | Admitting: Allergy

## 2020-10-07 VITALS — BP 138/80 | HR 79 | Temp 96.4°F | Resp 18 | Ht 64.0 in | Wt 212.2 lb

## 2020-10-07 DIAGNOSIS — K219 Gastro-esophageal reflux disease without esophagitis: Secondary | ICD-10-CM

## 2020-10-07 DIAGNOSIS — J3089 Other allergic rhinitis: Secondary | ICD-10-CM | POA: Diagnosis not present

## 2020-10-07 DIAGNOSIS — J4550 Severe persistent asthma, uncomplicated: Secondary | ICD-10-CM | POA: Diagnosis not present

## 2020-10-07 DIAGNOSIS — J4551 Severe persistent asthma with (acute) exacerbation: Secondary | ICD-10-CM | POA: Insufficient documentation

## 2020-10-07 MED ORDER — OMALIZUMAB 150 MG/ML ~~LOC~~ SOSY
300.0000 mg | PREFILLED_SYRINGE | SUBCUTANEOUS | Status: DC
  Administered 2020-10-07 – 2020-11-08 (×2): 300 mg via SUBCUTANEOUS

## 2020-10-07 MED ORDER — FLUCONAZOLE 150 MG PO TABS
150.0000 mg | ORAL_TABLET | Freq: Once | ORAL | 0 refills | Status: AC
Start: 2020-10-07 — End: 2020-10-07

## 2020-10-07 MED ORDER — OMALIZUMAB 150 MG/ML ~~LOC~~ SOSY: 150.0000 mg | PREFILLED_SYRINGE | SUBCUTANEOUS | Status: DC

## 2020-10-07 NOTE — Progress Notes (Signed)
Follow Up Note  RE: [name] MRN: [id number] DOB: Apr 20, 1946 Date of Office Visit: 10/07/2020  Referring provider: [name], MD Primary care provider: [name], MD  Chief Complaint: Asthma (Constant wheezing since last Saturday ) and Allergic Rhinitis  (Coughing and runny nose - on/off for 2 weeks )  History of Present Illness: I had the pleasure of seeing [name] for a follow up visit at the [facility] on 10/07/2020. She is a 75 y.o. female, who is being followed for asthma on Xolair 371m every 4 weeks, allergic rhinitis and LPRD. Her previous allergy office visit was on 05/31/2020 with Dr. KNeldon Mc Today is a new complaint visit of Wheezing.  Respiratory:  Patient has been wheezing since last Saturday and it seems to flare at night more. Some coughing with some phlegm which is usually clear.   No fevers or chills. No sick contacts.  No shortness of breath or chest tightness.  Currently on Advair 2350m 2 puffs twice a day with spacer and Spiriva 1.2531m2 puffs once a day. Denies any ER/urgent care visits use since the last visit. Had prednisone about 1 month ago for her back and knee issues.  Currently on Xolair 300m77mery 4 weeks and it seems to be helping.  She is due for her injections today.   Using albuterol nebulizer every 8 hours now with good benefit.   Rhinitis:  Still taking Singulair at night. Takes ipratropium 2 sprays once a day with minimal benefit. Takes mucinex DM.  GERD: Taking pantoprazole and famotidine daily.  Assessment and Plan: [name] 75 y33. female with: Severe persistent asthma with (acute) exacerbation Wheezing for about 1 week now. Denies any URI symptoms or fevers/chills. No oral prednisone and did not start on Flovent yet.  Today's spirometry showed some restriction with no improvement in FEV1 post bronchodilator treatment. Clinically feeling unchanged.   Start prednisone taper - monitor  BGM closely.  Use albuterol nebulizer machine twice a day before using your other inhalers.   Start Flovent 220mc62mpuffs twice a day with spacer and rinse mouth afterwards for 1-2 weeks until your breathing symptoms return to baseline.  . Daily controller medication(s): Continue Advair 230mcg47muffs twice a day with spacer and rinse mouth afterwards. . Continue Spiriva 1.25mcg 29mffs daily. . Continue Singulair (montelukast) 10mg da28mat night. . Continue Xolair 300mg eve73m weeks - given today. . During upper respiratory infections/asthma flares: start Flovent 220mcg 2 p18m twice a day with spacer and rinse mouth afterwards for 1-2 weeks until your breathing symptoms return to baseline.  . May use albuterol rescue inhaler 2 puffs every 4 to 6 hours as needed for shortness of breath, chest tightness, coughing, and wheezing. May use albuterol rescue inhaler 2 puffs 5 to 15 minutes prior to strenuous physical activities. Monitor frequency of use.   Allergic rhinitis Stable.  Continue environmental control measures.  Continue Singulair (montelukast) 10mg daily91mnight.  May use over the counter antihistamines such as Zyrtec (cetirizine), Claritin (loratadine), Allegra (fexofenadine), or Xyzal (levocetirizine) daily as needed.  May use ipratropium nasal spray 1-2 sprays per nostril twice a day as needed for runny nose/drainage.  GERD (gastroesophageal reflux disease) Stable.  Continue pantoprazole 40mg in the26mning.  Continue famotidine 40mg in the 83ming.  Return in about 2 months (around 12/07/2020).  Meds ordered this encounter  Medications  . fluconazole (DIFLUCAN) 150 MG tablet  Sig: Take 1 tablet (150 mg total) by mouth once for 1 dose. If no improvement after 72 hours then you may take another one tablet.    Dispense:  2 tablet    Refill:  0   Lab Orders  No laboratory test(s) ordered today    Diagnostics: Spirometry:  Tracings reviewed. Her effort: Good  reproducible efforts. FVC: 1.48L FEV1: 1.26L, 72% predicted FEV1/FVC ratio: 85% Interpretation: Spirometry consistent with possible restrictive disease with no improvement in FEV1 post bronchodilator treatment. Clinically feeling the same.  Please see scanned spirometry results for details.  Medication List:  Current Outpatient Medications  Medication Sig Dispense Refill  . acetaminophen (TYLENOL) 650 MG CR tablet Take 1,300 mg by mouth daily as needed for pain.    . Acetaminophen-Codeine 300-30 MG tablet     . ADVAIR HFA 230-21 MCG/ACT inhaler INHALE 2 PUFFS BY MOUTH TWICE DAILY TO PREVENT COUGH OR WHEEZING, RINSE MOUTH AFTER USE 12 g 4  . albuterol (VENTOLIN HFA) 108 (90 Base) MCG/ACT inhaler INHALE 2 PUFFS BY MOUTH INTO THE LUNGS EVERY 6 HOURS AS NEEDED FOR WHEEZING OR SHORTNESS OF BREATH 54 g 0  . antiseptic oral rinse (BIOTENE) LIQD 15 mLs by Mouth Rinse route in the morning and at bedtime.    Marland Kitchen Apoaequorin (PREVAGEN) 10 MG CAPS Take 10 mg by mouth daily.    . Artificial Saliva (BIOTENE DRY MOUTH) LOZG Use as directed 1 lozenge in the mouth or throat daily as needed (dry eyes).    Marland Kitchen atorvastatin (LIPITOR) 10 MG tablet Take 1 tablet (10 mg total) by mouth daily. 90 tablet 3  . Blood Glucose Monitoring Suppl (ONETOUCH VERIO) w/Device KIT Use to check blood sugar up to 2 times a day 1 kit 0  . Capsaicin-Menthol (SALONPAS GEL EX) Apply 1 application topically daily as needed (pain).    . Coenzyme Q10 (COQ-10) 100 MG CAPS Take 100 mg by mouth daily.    . cyclobenzaprine (FLEXERIL) 10 MG tablet TAKE 1 TABLET(10 MG) BY MOUTH AT BEDTIME AS NEEDED FOR MUSCLE SPASMS 30 tablet 1  . dextromethorphan-guaiFENesin (MUCINEX DM) 30-600 MG 12hr tablet Take 1 tablet by mouth 2 (two) times daily as needed for cough.    . diazepam (VALIUM) 10 MG tablet     . diclofenac Sodium (VOLTAREN) 1 % GEL Apply 2 g topically 4 (four) times daily as needed.    Marland Kitchen EPINEPHrine 0.3 mg/0.3 mL IJ SOAJ injection USE AS  DIRECTED FOR LIFE THREATENING ALLERGIC REACTIONS 2 each 2  . famotidine (PEPCID) 40 MG tablet TAKE 1 TABLET(40 MG) BY MOUTH DAILY 30 tablet 3  . Ferrous Sulfate (IRON) 325 (65 Fe) MG TABS Take 1 tablet by mouth daily.    . fluconazole (DIFLUCAN) 150 MG tablet Take 1 tablet (150 mg total) by mouth once for 1 dose. If no improvement after 72 hours then you may take another one tablet. 2 tablet 0  . fluticasone (FLOVENT HFA) 220 MCG/ACT inhaler INHALE 2 PUFFS BY MOUTH TWICE DAILY DURING FLARE UP AS NEEDED. RINSE, GARGLE AND SPIT AFTER USE 12 g 1  . gabapentin (NEURONTIN) 600 MG tablet TAKE 1 TABLET BY MOUTH DAILY AT BREAKFAST, 1 TABLET AT LUNCH, AND 2 TABLETS AT BEDTIME 360 tablet 1  . Garlic 915 MG TBEC Take 1 tablet by mouth daily.    Marland Kitchen GLYXAMBI 10-5 MG TABS TAKE 1 TABLET BY MOUTH DAILY 30 tablet 11  . Homeopathic Products (THERAWORX RELIEF EX) Apply 1 spray topically daily as  needed (muscle cramps).    . hydrOXYzine (ATARAX/VISTARIL) 10 MG tablet Take 1 tablet (10 mg total) by mouth daily as needed for itching. 15 tablet 0  . ipratropium (ATROVENT) 0.06 % nasal spray Place 2 sprays into both nostrils 2 (two) times daily as needed for rhinitis.    Marland Kitchen irbesartan (AVAPRO) 150 MG tablet TAKE 1 TABLET(150 MG) BY MOUTH DAILY 30 tablet 8  . Lactase (DAIRY-RELIEF PO) Take 1 capsule by mouth daily as needed (eating dairy).     Marland Kitchen levalbuterol (XOPENEX) 1.25 MG/3ML nebulizer solution USE 1 VIAL VIA NEBULIZER EVERY 6 HOURS AS NEEDED FOR WHEEZING OR SHORTNESS OF BREATH 9 mL 1  . meclizine (ANTIVERT) 12.5 MG tablet Take 1 tablet (12.5 mg total) by mouth 3 (three) times daily as needed for dizziness. 15 tablet 0  . methimazole (TAPAZOLE) 5 MG tablet Take 0.5 tablets (2.5 mg total) by mouth daily. 45 tablet 3  . Misc Natural Products (GLUCOSAMINE CHONDROITIN TRIPLE) TABS Take 2 tablets by mouth daily.    . montelukast (SINGULAIR) 10 MG tablet TAKE 1 TABLET(10 MG) BY MOUTH DAILY 90 tablet 1  . mupirocin cream  (BACTROBAN) 2 % Apply 1 application topically 2 (two) times daily. 15 g 0  . omalizumab (XOLAIR) 150 MG injection Inject 300 mg into the skin every 28 (twenty-eight) days.    . ondansetron (ZOFRAN-ODT) 4 MG disintegrating tablet Take 4 mg by mouth every 8 (eight) hours as needed for nausea or vomiting.     [name] Delica Lancets 73X MISC Use to check blood sugar up to 2 times a day. 100 each 5  . ONETOUCH VERIO test strip Use to check blood sugar up to 2 times a day 100 each 5  . oxybutynin (DITROPAN-XL) 5 MG 24 hr tablet Take 5 mg by mouth daily.    . pantoprazole (PROTONIX) 40 MG tablet TAKE 1 TABLET(40 MG) BY MOUTH DAILY 90 tablet 1  . PATADAY 0.2 % SOLN Place 1 drop into both eyes daily as needed (allergies).   4  . Potassium 99 MG TABS Take 99 mg by mouth daily.    . Tiotropium Bromide Monohydrate (SPIRIVA RESPIMAT) 1.25 MCG/ACT AERS Inhale 2 puffs into the lungs daily. 4 g 5  . tolterodine (DETROL) 2 MG tablet Take 2 mg by mouth daily.    Marland Kitchen triamcinolone (KENALOG) 0.1 % Apply 1 application topically 2 (two) times daily as needed.    . TRULANCE 3 MG TABS Take 3 mg by mouth daily.     . TURMERIC PO Take 1,000 mg by mouth daily.    Alveda Reasons 20 MG TABS tablet TAKE 1 TABLET(20 MG) BY MOUTH DAILY WITH SUPPER 90 tablet 1   Current Facility-Administered Medications  Medication Dose Route Frequency Provider Last Rate Last Admin  . omalizumab Arvid Right) injection 300 mg  300 mg Subcutaneous Q28 days [name], MD   300 mg at 09/06/20 1124  . omalizumab Arvid Right) prefilled syringe 300 mg  300 mg Subcutaneous Q28 days Garnet Sierras, DO   300 mg at 10/07/20 1507   Allergies: Allergies  Allergen Reactions  . Latex Hives  . Penicillins Swelling    Has patient had a PCN reaction causing immediate rash, facial/tongue/throat swelling, SOB or lightheadedness with hypotension patient had a PCN reaction causing severe rash involving mucus membranes or skin necrosis: [id number]} Has patient had a  PCN reaction that required hospitalization/No Has patient had a PCN reaction occurring within the last 10 years: No  If all of the above answers are "NO", then may proceed with Cephalosporin use.     Marland Kitchen Oxycodone-Acetaminophen Nausea And Vomiting  . Lyrica [Pregabalin] Other (See Comments)    Blurred vision.  . Nickel Rash  . Phenytoin Sodium Extended Swelling  . Vicodin [Hydrocodone-Acetaminophen] Nausea And Vomiting   I reviewed her past medical history, social history, family history, and environmental history and no significant changes have been reported from her previous visit.  Review of Systems  Constitutional: Negative for appetite change, chills, fever and unexpected weight change.  HENT: Positive for rhinorrhea. Negative for congestion.   Eyes: Negative for itching.  Respiratory: Positive for cough and wheezing. Negative for chest tightness and shortness of breath.   Gastrointestinal: Positive for diarrhea and nausea. Negative for abdominal pain.  Skin: Negative for rash.  Neurological: Negative for headaches.   Objective: BP 138/80   Pulse 79   Temp (!) 96.4 F (35.8 C)   Resp 18   Ht '5\' 4"'  (1.626 m)   Wt 212 lb 3.2 oz (96.3 kg)   SpO2 97%   BMI 36.42 kg/m  Body mass index is 36.42 kg/m. Physical Exam Vitals and nursing note reviewed.  Constitutional:      Appearance: Normal appearance. She is well-developed.  HENT:     Head: Normocephalic and atraumatic.     Right Ear: Tympanic membrane and external ear normal.     Left Ear: Tympanic membrane and external ear normal.     Nose: Nose normal.     Mouth/Throat:     Mouth: Mucous membranes are moist.     Pharynx: Oropharynx is clear.  Eyes:     Conjunctiva/sclera: Conjunctivae normal.  Cardiovascular:     Rate and Rhythm: Normal rate and regular rhythm.     Heart sounds: Normal heart sounds. No murmur heard.   Pulmonary:     Effort: Pulmonary effort is normal.     Breath sounds: Wheezing present. No  rhonchi or rales.  Musculoskeletal:     Cervical back: Neck supple.  Skin:    General: Skin is warm.     Findings: No rash.  Neurological:     Mental Status: She is alert and oriented to person, place, and time.  Psychiatric:        Behavior: Behavior normal.    Previous notes and tests were reviewed. The plan was reviewed with the patient/family, and all questions/concerned were addressed.  It was my pleasure to see Elana today and participate in her care. Please feel free to contact me with any questions or concerns.  Sincerely,  Rexene Alberts, DO Allergy & Immunology  [facility] office: [place] office: [phone]

## 2020-10-07 NOTE — Assessment & Plan Note (Signed)
Wheezing for about 1 week now. Denies any URI symptoms or fevers/chills. No oral prednisone and did not start on Flovent yet.  Today's spirometry showed some restriction with no improvement in FEV1 post bronchodilator treatment. Clinically feeling unchanged.   Start prednisone taper - monitor BGM closely.  Use albuterol nebulizer machine twice a day before using your other inhalers.   Start Flovent 278mcg 2 puffs twice a day with spacer and rinse mouth afterwards for 1-2 weeks until your breathing symptoms return to baseline.  . Daily controller medication(s): Continue Advair 252mcg 2 puffs twice a day with spacer and rinse mouth afterwards. . Continue Spiriva 1.65mcg 2 puffs daily. . Continue Singulair (montelukast) 10mg  daily at night. . Continue Xolair 300mg  every 4 weeks - given today. . During upper respiratory infections/asthma flares: start Flovent 233mcg 2 puffs twice a day with spacer and rinse mouth afterwards for 1-2 weeks until your breathing symptoms return to baseline.  . May use albuterol rescue inhaler 2 puffs every 4 to 6 hours as needed for shortness of breath, chest tightness, coughing, and wheezing. May use albuterol rescue inhaler 2 puffs 5 to 15 minutes prior to strenuous physical activities. Monitor frequency of use.

## 2020-10-07 NOTE — Patient Instructions (Addendum)
Asthma exacerbation: Start prednisone taper - make sure you keep a close eye on your sugars.  Prednisone 10mg  tablet pack: 2 tablets given in office today. Take 2 more tablets before bed today.  Then take 2 tablets twice a day for 2 more days. Then take 2 tablets once a day for 1 day. Then take 1 tablet once a day for 1 day.   Use albuterol nebulizer machine twice a day before using your other inhalers.  Start Flovent 280mcg 2 puffs twice a day with spacer and rinse mouth afterwards for 1-2 weeks until your breathing symptoms return to baseline.   . Daily controller medication(s): Continue Advair 230mcg 2 puffs twice a day with spacer and rinse mouth afterwards. . Continue Spiriva 1.4mcg 2 puffs daily. . Continue Singulair (montelukast) 10mg  daily at night. . Continue Xolair 300mg  every 4 weeks - given today.  . During upper respiratory infections/asthma flares: start Flovent 251mcg 2 puffs twice a day with spacer and rinse mouth afterwards for 1-2 weeks until your breathing symptoms return to baseline.  . May use albuterol rescue inhaler 2 puffs every 4 to 6 hours as needed for shortness of breath, chest tightness, coughing, and wheezing. May use albuterol rescue inhaler 2 puffs 5 to 15 minutes prior to strenuous physical activities. Monitor frequency of use.  . Asthma control goals:  o Full participation in all desired activities (may need albuterol before activity) o Albuterol use two times or less a week on average (not counting use with activity) o Cough interfering with sleep two times or less a month o Oral steroids no more than once a year o No hospitalizations  Yeast infection  Typically prednisone does not cause yeast infections.  I sent in diflucan 150mg  as you requested. If no improvement in symptoms after 72 hours then you may take another dose.   Allergic rhinitis  Continue environmental control measures.  Continue Singulair (montelukast) 10mg  daily at night.  May  use over the counter antihistamines such as Zyrtec (cetirizine), Claritin (loratadine), Allegra (fexofenadine), or Xyzal (levocetirizine) daily as needed.  May use ipratropium nasal spray 1-2 sprays per nostril twice a day as needed for runny nose/drainage.  GERD  Continue pantoprazole 40mg  in the morning.  Continue famotidine 40mg  in the evening  Follow up with Dr. Neldon Mc as scheduled in May 2022. Cancel next week's appointment if you are feeling better.

## 2020-10-07 NOTE — Assessment & Plan Note (Signed)
Stable.  Continue environmental control measures.  Continue Singulair (montelukast) 10mg  daily at night.  May use over the counter antihistamines such as Zyrtec (cetirizine), Claritin (loratadine), Allegra (fexofenadine), or Xyzal (levocetirizine) daily as needed.  May use ipratropium nasal spray 1-2 sprays per nostril twice a day as needed for runny nose/drainage.

## 2020-10-07 NOTE — Assessment & Plan Note (Signed)
Stable.  Continue pantoprazole 40mg  in the morning.  Continue famotidine 40mg  in the evening.

## 2020-10-09 NOTE — Assessment & Plan Note (Signed)
PDMP reviewed.. no red flags.  Refilled Rx for tyelynol with codeine.

## 2020-10-09 NOTE — Assessment & Plan Note (Signed)
rx  Glucometer sent

## 2020-10-09 NOTE — Assessment & Plan Note (Signed)
Improving, no active infection.

## 2020-10-10 ENCOUNTER — Encounter: Payer: Self-pay | Admitting: Podiatry

## 2020-10-10 ENCOUNTER — Emergency Department (HOSPITAL_COMMUNITY)
Admission: EM | Admit: 2020-10-10 | Discharge: 2020-10-11 | Disposition: A | Discharge status: Home / Self Care | Payer: Medicare Other | Attending: Emergency Medicine | Admitting: Emergency Medicine

## 2020-10-10 ENCOUNTER — Emergency Department (HOSPITAL_COMMUNITY): Payer: Medicare Other

## 2020-10-10 ENCOUNTER — Telehealth: Payer: Self-pay | Admitting: Family Medicine

## 2020-10-10 ENCOUNTER — Other Ambulatory Visit: Payer: Self-pay

## 2020-10-10 ENCOUNTER — Ambulatory Visit (INDEPENDENT_AMBULATORY_CARE_PROVIDER_SITE_OTHER): Payer: Medicare Other | Admitting: Podiatry

## 2020-10-10 ENCOUNTER — Telehealth: Payer: Self-pay

## 2020-10-10 ENCOUNTER — Encounter (HOSPITAL_COMMUNITY): Payer: Self-pay | Admitting: Emergency Medicine

## 2020-10-10 DIAGNOSIS — D689 Coagulation defect, unspecified: Secondary | ICD-10-CM

## 2020-10-10 DIAGNOSIS — R0600 Dyspnea, unspecified: Secondary | ICD-10-CM | POA: Insufficient documentation

## 2020-10-10 DIAGNOSIS — Z7951 Long term (current) use of inhaled steroids: Secondary | ICD-10-CM | POA: Insufficient documentation

## 2020-10-10 DIAGNOSIS — D2371 Other benign neoplasm of skin of right lower limb, including hip: Secondary | ICD-10-CM

## 2020-10-10 DIAGNOSIS — I4891 Unspecified atrial fibrillation: Secondary | ICD-10-CM | POA: Insufficient documentation

## 2020-10-10 DIAGNOSIS — Z7984 Long term (current) use of oral hypoglycemic drugs: Secondary | ICD-10-CM | POA: Insufficient documentation

## 2020-10-10 DIAGNOSIS — E1140 Type 2 diabetes mellitus with diabetic neuropathy, unspecified: Secondary | ICD-10-CM | POA: Diagnosis not present

## 2020-10-10 DIAGNOSIS — Z79899 Other long term (current) drug therapy: Secondary | ICD-10-CM | POA: Diagnosis not present

## 2020-10-10 DIAGNOSIS — J4551 Severe persistent asthma with (acute) exacerbation: Secondary | ICD-10-CM | POA: Insufficient documentation

## 2020-10-10 DIAGNOSIS — M79676 Pain in unspecified toe(s): Secondary | ICD-10-CM | POA: Diagnosis not present

## 2020-10-10 DIAGNOSIS — R42 Dizziness and giddiness: Secondary | ICD-10-CM | POA: Diagnosis not present

## 2020-10-10 DIAGNOSIS — I10 Essential (primary) hypertension: Secondary | ICD-10-CM | POA: Diagnosis not present

## 2020-10-10 DIAGNOSIS — R0602 Shortness of breath: Secondary | ICD-10-CM

## 2020-10-10 DIAGNOSIS — B351 Tinea unguium: Secondary | ICD-10-CM

## 2020-10-10 DIAGNOSIS — Z91040 Latex allergy status: Secondary | ICD-10-CM | POA: Insufficient documentation

## 2020-10-10 DIAGNOSIS — R079 Chest pain, unspecified: Secondary | ICD-10-CM | POA: Insufficient documentation

## 2020-10-10 DIAGNOSIS — J90 Pleural effusion, not elsewhere classified: Secondary | ICD-10-CM | POA: Diagnosis not present

## 2020-10-10 DIAGNOSIS — R7989 Other specified abnormal findings of blood chemistry: Secondary | ICD-10-CM | POA: Diagnosis not present

## 2020-10-10 DIAGNOSIS — R778 Other specified abnormalities of plasma proteins: Secondary | ICD-10-CM

## 2020-10-10 DIAGNOSIS — D2372 Other benign neoplasm of skin of left lower limb, including hip: Secondary | ICD-10-CM | POA: Diagnosis not present

## 2020-10-10 DIAGNOSIS — Z7901 Long term (current) use of anticoagulants: Secondary | ICD-10-CM | POA: Diagnosis not present

## 2020-10-10 DIAGNOSIS — E1142 Type 2 diabetes mellitus with diabetic polyneuropathy: Secondary | ICD-10-CM

## 2020-10-10 LAB — CBC
HCT: 38.3 % (ref 36.0–46.0)
Hemoglobin: 12.7 g/dL (ref 12.0–15.0)
MCH: 26 pg (ref 26.0–34.0)
MCHC: 33.2 g/dL (ref 30.0–36.0)
MCV: 78.3 fL — ABNORMAL LOW (ref 80.0–100.0)
Platelets: 313 10*3/uL (ref 150–400)
RBC: 4.89 MIL/uL (ref 3.87–5.11)
RDW: 17.9 % — ABNORMAL HIGH (ref 11.5–15.5)
WBC: 9.5 10*3/uL (ref 4.0–10.5)
nRBC: 0 % (ref 0.0–0.2)

## 2020-10-10 LAB — TROPONIN I (HIGH SENSITIVITY)
Troponin I (High Sensitivity): 24 ng/L — ABNORMAL HIGH (ref <18)
Troponin I (High Sensitivity): 22 ng/L — ABNORMAL HIGH (ref <18)

## 2020-10-10 LAB — BASIC METABOLIC PANEL
Anion gap: 6 (ref 5–15)
BUN: 7 mg/dL — ABNORMAL LOW (ref 8–23)
CO2: 27 mmol/L (ref 22–32)
Calcium: 8.8 mg/dL — ABNORMAL LOW (ref 8.9–10.3)
Chloride: 107 mmol/L (ref 98–111)
Creatinine, Ser: 0.95 mg/dL (ref 0.44–1.00)
GFR, Estimated: >60 mL/min (ref >60)
Glucose, Bld: 107 mg/dL — ABNORMAL HIGH (ref 70–99)
Potassium: 3.5 mmol/L (ref 3.5–5.1)
Sodium: 140 mmol/L (ref 135–145)

## 2020-10-10 NOTE — Telephone Encounter (Signed)
Patient called and stated that she had been experiencing wheezing since Friday and developed SOB this morning. This RN could tell that patient was slightly SOB when speaking with her. Patient denied chest pain and stated that she had already used all of her inhalers without relief. Instructed patient that since she is experiencing SOB and wheezing without relief, patient should be seen at ED as soon as possible. Offered to call 911 for patient, but she denied and stated that she would call someone to come and get her and take her to the ED. Patient very appreciative of help. Instructed patient that if her SOB worsened before someone come to get her she should call 911. Patient verbalized understanding.

## 2020-10-10 NOTE — Progress Notes (Signed)
She presents today chief complaint of painfully elongated toenails.  Objective: Pulses are palpable toenails are long thick yellow dystrophic Lee mycotic painful palpation.  Multiple calluses are noted.  Assessment: Diabetic peripheral neuropathy hammertoe deformities bilateral preulcerative reactive hyperkeratotic tissue.  Plan: I debrided nails 1 through 5 bilaterally.  She needs to see EJ for diabetic shoes.  Follow-up with me or Dr. Adah Perl for debridement.

## 2020-10-10 NOTE — ED Triage Notes (Signed)
Pt. Stated, I started having some chest pain with SOB with wheezing , started around 3ish today

## 2020-10-11 ENCOUNTER — Ambulatory Visit: Payer: Medicare Other | Admitting: Allergy and Immunology

## 2020-10-11 ENCOUNTER — Telehealth: Payer: Self-pay | Admitting: Cardiovascular Disease

## 2020-10-11 ENCOUNTER — Other Ambulatory Visit: Payer: Self-pay | Admitting: *Deleted

## 2020-10-11 DIAGNOSIS — I5030 Unspecified diastolic (congestive) heart failure: Secondary | ICD-10-CM

## 2020-10-11 DIAGNOSIS — R0602 Shortness of breath: Secondary | ICD-10-CM | POA: Diagnosis not present

## 2020-10-11 LAB — BRAIN NATRIURETIC PEPTIDE: B Natriuretic Peptide: 645.2 pg/mL — ABNORMAL HIGH (ref 0.0–100.0)

## 2020-10-11 LAB — TROPONIN I (HIGH SENSITIVITY): Troponin I (High Sensitivity): 21 ng/L — ABNORMAL HIGH (ref <18)

## 2020-10-11 MED ORDER — POTASSIUM CHLORIDE CRYS ER 20 MEQ PO TBCR: 20.0000 meq | EXTENDED_RELEASE_TABLET | Freq: Two times a day (BID) | ORAL | 0 refills | Status: DC

## 2020-10-11 MED ORDER — PREDNISONE 20 MG PO TABS
ORAL_TABLET | ORAL | Status: AC
  Filled 2020-10-11: qty 3

## 2020-10-11 MED ORDER — PREDNISONE 20 MG PO TABS
60.0000 mg | ORAL_TABLET | Freq: Once | ORAL | Status: AC
  Administered 2020-10-11: 60 mg via ORAL

## 2020-10-11 MED ORDER — ALBUTEROL SULFATE HFA 108 (90 BASE) MCG/ACT IN AERS
4.0000 | INHALATION_SPRAY | Freq: Once | RESPIRATORY_TRACT | Status: AC
  Administered 2020-10-11: 4 via RESPIRATORY_TRACT

## 2020-10-11 MED ORDER — FUROSEMIDE 40 MG PO TABS: 40.0000 mg | ORAL_TABLET | Freq: Every day | ORAL | 0 refills | Status: DC

## 2020-10-11 MED ORDER — ASPIRIN 81 MG PO CHEW
CHEWABLE_TABLET | ORAL | Status: AC
  Filled 2020-10-11: qty 4

## 2020-10-11 MED ORDER — ALBUTEROL SULFATE HFA 108 (90 BASE) MCG/ACT IN AERS
INHALATION_SPRAY | RESPIRATORY_TRACT | Status: AC
  Filled 2020-10-11: qty 6.7

## 2020-10-11 MED ORDER — ASPIRIN 81 MG PO CHEW
324.0000 mg | CHEWABLE_TABLET | Freq: Once | ORAL | Status: AC
  Administered 2020-10-11: 324 mg via ORAL

## 2020-10-11 NOTE — ED Notes (Signed)
Patient verbalizes understanding of discharge instructions. Prescriptions and follow-up care reviewed. Opportunity for questioning and answers were provided. Armband removed by staff, pt discharged from ED ambulatory.  

## 2020-10-11 NOTE — Progress Notes (Signed)
Bring her in first available and will do echo whenever that's available. Thanks again! Don't know what I will do without you.Marland KitchenMarland Kitchen

## 2020-10-11 NOTE — Progress Notes (Signed)
Thanks, will follow up! Can we please get on schedule? Ideally echo for diastolic CHF before appt. Thank you!

## 2020-10-11 NOTE — Progress Notes (Signed)
Thanks, first available then.

## 2020-10-11 NOTE — Telephone Encounter (Signed)
Spoke with patient regarding follow up appointment with Dr. Sallyanne Kuster scheduled Wednesday 10/26/20 at 9:20 am.  Patient voiced her understanding.

## 2020-10-11 NOTE — Telephone Encounter (Signed)
error 

## 2020-10-11 NOTE — Discharge Instructions (Signed)
Return to the emergency department if your symptoms are getting worse.

## 2020-10-11 NOTE — Telephone Encounter (Signed)
Noted. Agree with ED visit.

## 2020-10-11 NOTE — Telephone Encounter (Signed)
Spoke with patient regarding 11/07/20 3:45 pm Echo scheduled at [address], [address] .  Will mail information to patient and she voiced her understanding.

## 2020-10-11 NOTE — ED Provider Notes (Signed)
[facility] EMERGENCY DEPARTMENT Provider Note   CSN: [id number] Arrival date & time: 10/10/20  1757   History Chief Complaint  Patient presents with  . Chest Pain  . Shortness of Breath  . Dizziness    [name] is a 75 y.o. female.  The history is provided by the patient.  Chest Pain Associated symptoms: dizziness and shortness of breath   Shortness of Breath Associated symptoms: chest pain   Dizziness Associated symptoms: chest pain and shortness of breath   She has history of hypertension, diabetes, asthma, seizures, pulmonary embolism anticoagulated on rivaroxaban and comes in because of chest pain, dizziness, dyspnea.  She has been wheezing for the last 4days.  Yesterday, she developed dizziness which was a near syncopal feeling and also some chest pain which she described as a dull achy feeling.  She denies nausea, vomiting, diaphoresis.  She denies fever or chills.  She denies cough.  Nothing made her symptoms better, nothing made them worse.  Chest pain which has been present most of the time but did resolve briefly yesterday evening and also this evening.  She currently rates her pain at 6/10.  She has not done anything to treat it.  She has been using her home nebulizer for her wheezing which has given temporary relief.  She is a non-smoker and denies history of hyperlipidemia.  There is no family history of premature coronary atherosclerosis.  Past Medical History:  Diagnosis Date  . Allergic rhinitis   . Allergy   . Arthritis   . Asthma   . Chronic headache   . Diabetes mellitus   . DVT (deep venous thrombosis) ([place])   . Dyspnea   . Heart murmur   . Hypertension   . Penicillin allergy 01/19/2020  . Pulmonary embolism ([place])   . Pulmonary hypertension ([place])   . Seizures [facility])     Patient Active Problem List   Diagnosis Date Noted  . Severe persistent asthma with (acute) exacerbation 10/07/2020  . Skin ulcer of buttock, limited to  breakdown of skin ([place]) 08/05/2020  . Controlled type 2 diabetes mellitus without complication ([place]) 46/27/0350  . Arm pain, lateral, right 06/03/2020  . High risk medication use 02/02/2020  . Penicillin allergy 01/19/2020  . Discitis of lumbosacral region 01/04/2020  . Hyperlipidemia associated with type 2 diabetes mellitus ([place]) 05/07/2019  . Chronic pain 12/11/2018  . Obstructive sleep apnea treated with continuous positive airway pressure (CPAP) 06/03/2018  . Encounter for chronic pain management 04/01/2018  . Left thyroid nodule 02/06/2018  . Insomnia secondary to chronic pain 01/26/2018  . Class 3 drug-induced obesity with serious comorbidity and body mass index (BMI) of 40.0 to 44.9 in adult ([place]) 12/03/2017  . Graves disease 10/15/2017  . Mild obstructive sleep apnea 10/15/2017  . Hot flashes not due to menopause 09/27/2017  . Chronic low back pain without sciatica 12/03/2016  . Generalized abdominal pain 11/26/2016  . Bilateral high frequency sensorineural hearing loss 10/24/2016  . Subjective tinnitus, bilateral 10/24/2016  . [place] intertrigo 07/24/2016  . Groin pain 04/27/2016  . Urge incontinence 03/27/2016  . Moderate persistent asthma 07/12/2015  . Paroxysmal atrial fibrillation with rapid ventricular response ([place]) 06/03/2015  . Counseling regarding end of life decision making 05/05/2015  . Neuropathy due to secondary diabetes ([place]) 01/11/2015  . Pain in Achilles tendon 01/11/2015  . Migraine headache without aura 08/24/2014  . Allergic rhinitis 03/25/2014  . DVT (deep venous thrombosis) hx of  03/25/2014  .  Osteoarthritis of right knee 03/25/2014  .  ? of Seizure disorder 03/25/2014  . Depression, major, in remission ([place]) 03/25/2014  . Diabetes mellitus with neuropathy ([place]) 03/25/2014  . Hypertension associated with diabetes ([place]) 03/25/2014  . GERD (gastroesophageal reflux disease) 03/25/2014  . History of pulmonary embolism 10/09/2013  . Hypercoagulable  state ([place]) 01/29/2011  . Asthma, moderate persistent 01/24/2011  . Pulmonary hypertension ([place]) 01/11/2011    Past Surgical History:  Procedure Laterality Date  . ABDOMINAL HYSTERECTOMY     partial, has ovaries  . CARDIAC CATHETERIZATION  12/28/2010   Mod. pulmonary hypertension, normal coronary arteries  . DOPPLER ECHOCARDIOGRAPHY  10/08/2011   EF=>55%,mild asymmetric LVH, mod. TR, mod. PH, mild to mod LA dilatation  . IR LUMBAR DISC ASPIRATION W/IMG GUIDE  01/12/2020  . KNEE ARTHROSCOPY Left   . KNEE SURGERY    . Nuclear Stress Test  05/20/2006   No ischemia  . PARTIAL HYSTERECTOMY    . PLANTAR FASCIA SURGERY    . TONSILLECTOMY       OB History   No obstetric history on file.     Family History  Problem Relation Age of Onset  . Hypertension Mother   . Clotting disorder Mother   . Breast cancer Mother   . Arthritis Mother   . Stroke Mother   . Diabetes Mother   . Cancer Brother   . Alcohol abuse Father   . Arthritis Sister   . Diabetes Sister   . Multiple sclerosis Sister   . Allergies Other        grandson  . Allergic rhinitis Neg Hx   . Angioedema Neg Hx   . Asthma Neg Hx   . Atopy Neg Hx   . Eczema Neg Hx   . Immunodeficiency Neg Hx   . Urticaria Neg Hx     Social History   Tobacco Use  . Smoking status: Never Smoker  . Smokeless tobacco: Never Used  Vaping Use  . Vaping Use: Never used  Substance Use Topics  . Alcohol use: Not Currently    Alcohol/week: 0.0 standard drinks    Comment: occ glass on wine  . Drug use: No    Home Medications Prior to Admission medications   Medication Sig Start Date End Date Taking? Authorizing Provider  acetaminophen (TYLENOL) 650 MG CR tablet Take 1,300 mg by mouth daily as needed for pain.    [provider]  Acetaminophen-Codeine 300-30 MG tablet  07/23/19   [provider]  ADVAIR HFA 230-21 MCG/ACT inhaler INHALE 2 PUFFS BY MOUTH TWICE DAILY TO PREVENT COUGH OR WHEEZING, RINSE MOUTH AFTER USE  01/11/20   Kozlow, Donnamarie Poag, MD  albuterol (VENTOLIN HFA) 108 (90 Base) MCG/ACT inhaler INHALE 2 PUFFS BY MOUTH INTO THE LUNGS EVERY 6 HOURS AS NEEDED FOR WHEEZING OR SHORTNESS OF BREATH 03/10/20   Kozlow, Donnamarie Poag, MD  antiseptic oral rinse (BIOTENE) LIQD 15 mLs by Mouth Rinse route in the morning and at bedtime.    [provider]  Apoaequorin (PREVAGEN) 10 MG CAPS Take 10 mg by mouth daily.    [provider]  Artificial Saliva (BIOTENE DRY MOUTH) LOZG Use as directed 1 lozenge in the mouth or throat daily as needed (dry eyes).    [provider]  atorvastatin (LIPITOR) 10 MG tablet Take 1 tablet (10 mg total) by mouth daily. 11/03/19   Bedsole, Amy E, MD  Blood Glucose Monitoring Suppl (ONETOUCH VERIO) w/Device KIT Use to check blood  sugar up to 2 times a day 08/16/20   Bedsole, Amy E, MD  Capsaicin-Menthol (SALONPAS GEL EX) Apply 1 application topically daily as needed (pain).    [provider]  Coenzyme Q10 (COQ-10) 100 MG CAPS Take 100 mg by mouth daily.    [provider]  cyclobenzaprine (FLEXERIL) 10 MG tablet TAKE 1 TABLET(10 MG) BY MOUTH AT BEDTIME AS NEEDED FOR MUSCLE SPASMS 04/28/20   Bedsole, Amy E, MD  dextromethorphan-guaiFENesin (MUCINEX DM) 30-600 MG 12hr tablet Take 1 tablet by mouth 2 (two) times daily as needed for cough.    [provider]  diazepam (VALIUM) 10 MG tablet  08/15/20   [provider]  diclofenac Sodium (VOLTAREN) 1 % GEL Apply 2 g topically 4 (four) times daily as needed.    [provider]  EPINEPHrine 0.3 mg/0.3 mL IJ SOAJ injection USE AS DIRECTED FOR LIFE THREATENING ALLERGIC REACTIONS 11/24/19   Kozlow, Donnamarie Poag, MD  famotidine (PEPCID) 40 MG tablet TAKE 1 TABLET(40 MG) BY MOUTH DAILY 08/01/20   Kozlow, Donnamarie Poag, MD  Ferrous Sulfate (IRON) 325 (65 Fe) MG TABS Take 1 tablet by mouth daily.    [provider]  fluticasone (FLOVENT HFA) 220 MCG/ACT inhaler INHALE 2 PUFFS BY MOUTH TWICE DAILY  DURING FLARE UP AS NEEDED. RINSE, GARGLE AND SPIT AFTER USE 04/11/20   Kozlow, Donnamarie Poag, MD  gabapentin (NEURONTIN) 600 MG tablet TAKE 1 TABLET BY MOUTH DAILY AT BREAKFAST, 1 TABLET AT LUNCH, AND 2 TABLETS AT BEDTIME 07/06/20   Hyatt, Max T, DPM  Garlic 191 MG TBEC Take 1 tablet by mouth daily.    [provider]  GLYXAMBI 10-5 MG TABS TAKE 1 TABLET BY MOUTH DAILY 08/22/20   Bedsole, Amy E, MD  Homeopathic Products [facility] RELIEF EX) Apply 1 spray topically daily as needed (muscle cramps).    [provider]  hydrOXYzine (ATARAX/VISTARIL) 10 MG tablet Take 1 tablet (10 mg total) by mouth daily as needed for itching. 02/02/20   Bedsole, Amy E, MD  ipratropium (ATROVENT) 0.06 % nasal spray Place 2 sprays into both nostrils 2 (two) times daily as needed for rhinitis.    [provider]  irbesartan (AVAPRO) 150 MG tablet TAKE 1 TABLET(150 MG) BY MOUTH DAILY 09/12/20   Croitoru, Mihai, MD  Lactase (DAIRY-RELIEF PO) Take 1 capsule by mouth daily as needed (eating dairy).     [provider]  levalbuterol (XOPENEX) 1.25 MG/3ML nebulizer solution USE 1 VIAL VIA NEBULIZER EVERY 6 HOURS AS NEEDED FOR WHEEZING OR SHORTNESS OF BREATH 12/03/19   Kozlow, Donnamarie Poag, MD  meclizine (ANTIVERT) 12.5 MG tablet Take 1 tablet (12.5 mg total) by mouth 3 (three) times daily as needed for dizziness. 02/01/20   Bedsole, Amy E, MD  methimazole (TAPAZOLE) 5 MG tablet Take 0.5 tablets (2.5 mg total) by mouth daily. 06/15/20   Philemon Kingdom, MD  Misc Natural Products (GLUCOSAMINE CHONDROITIN TRIPLE) TABS Take 2 tablets by mouth daily.    [provider]  montelukast (SINGULAIR) 10 MG tablet TAKE 1 TABLET(10 MG) BY MOUTH DAILY 06/27/20   Kozlow, Donnamarie Poag, MD  mupirocin cream (BACTROBAN) 2 % Apply 1 application topically 2 (two) times daily. 08/04/20   Bedsole, Amy E, MD  omalizumab Arvid Right) 150 MG injection Inject 300 mg into the skin every 28 (twenty-eight) days.    [provider]   ondansetron (ZOFRAN-ODT) 4 MG disintegrating tablet Take 4 mg by mouth every 8 (eight) hours as needed for  nausea or vomiting.  07/07/19   [provider]  OneTouch Delica Lancets 66Y MISC Use to check blood sugar up to 2 times a day. 10/20/19   [name], MD  ONETOUCH VERIO test strip Use to check blood sugar up to 2 times a day 10/20/19   Bedsole, Amy E, MD  oxybutynin (DITROPAN-XL) 5 MG 24 hr tablet Take 5 mg by mouth daily. 09/18/19   [provider]  pantoprazole (PROTONIX) 40 MG tablet TAKE 1 TABLET(40 MG) BY MOUTH DAILY 08/11/20   Kozlow, Donnamarie Poag, MD  PATADAY 0.2 % SOLN Place 1 drop into both eyes daily as needed (allergies).  01/27/17   [provider]  Potassium 99 MG TABS Take 99 mg by mouth daily.    [provider]  Tiotropium Bromide Monohydrate (SPIRIVA RESPIMAT) 1.25 MCG/ACT AERS Inhale 2 puffs into the lungs daily. 11/24/19   Kozlow, Donnamarie Poag, MD  tolterodine (DETROL) 2 MG tablet Take 2 mg by mouth daily.    [provider]  triamcinolone (KENALOG) 0.1 % Apply 1 application topically 2 (two) times daily as needed.    [provider]  TRULANCE 3 MG TABS Take 3 mg by mouth daily.  12/06/18   [provider]  TURMERIC PO Take 1,000 mg by mouth daily.    [provider]  XARELTO 20 MG TABS tablet TAKE 1 TABLET(20 MG) BY MOUTH DAILY WITH SUPPER 08/29/20   Croitoru, Mihai, MD    Allergies    Latex, Penicillins, Oxycodone-acetaminophen, Lyrica [pregabalin], Nickel, Phenytoin sodium extended, and Vicodin [hydrocodone-acetaminophen]  Review of Systems   Review of Systems  Respiratory: Positive for shortness of breath.   Cardiovascular: Positive for chest pain.  Neurological: Positive for dizziness.  All other systems reviewed and are negative.   Physical Exam Updated Vital Signs BP (!) 163/105 (BP Location: Left Arm)   Pulse 80   Temp 98 F (36.7 C) (Oral)   Resp 15   SpO2 100%   Physical Exam Vitals and  nursing note reviewed.   75 year old female, resting comfortably and in no acute distress. Vital signs are significant for elevated blood pressure and elevated respiratory rate. Oxygen saturation is 100%, which is normal. Head is normocephalic and atraumatic. PERRLA, EOMI. Oropharynx is clear. Neck is nontender and supple without adenopathy or JVD. Back is nontender and there is no CVA tenderness. Lungs have faint expiratory wheezes without rales or rhonchi. Chest is nontender. Heart has regular rate and rhythm without murmur. Abdomen is soft, flat, nontender without masses or hepatosplenomegaly and peristalsis is normoactive. Extremities have trace edema, full range of motion is present. Skin is warm and dry without rash. Neurologic: Mental status is normal, cranial nerves are intact, there are no motor or sensory deficits.  ED Results / Procedures / Treatments   Labs (all labs ordered are listed, but only abnormal results are displayed) Labs Reviewed  BASIC METABOLIC PANEL - Abnormal; Notable for the following components:      Result Value   Glucose, Bld 107 (*)    BUN 7 (*)    Calcium 8.8 (*)    All other components within normal limits  CBC - Abnormal; Notable for the following components:   MCV 78.3 (*)    RDW 17.9 (*)    All other components within normal limits  TROPONIN I (HIGH SENSITIVITY) - Abnormal; Notable for the following components:   Troponin I (High Sensitivity) 24 (*)    All other components  within normal limits  TROPONIN I (HIGH SENSITIVITY) - Abnormal; Notable for the following components:   Troponin I (High Sensitivity) 22 (*)    All other components within normal limits    EKG EKG Interpretation  Date/Time:  Monday October 10 2020 18:37:58 EDT Ventricular Rate:  82 PR Interval:    QRS Duration: 104 QT Interval:  368 QTC Calculation: 429 R Axis:   -14 Text Interpretation: Sinus rhythm Incomplete right bundle branch block Anteroseptal infarct , age  undetermined Abnormal ECG When compared with ECG of 04/21/2019, Incomplete right bundle branch block is now present Confirmed by Delora Fuel ([id number]) on 10/11/2020 12:06:43 AM   EKG Interpretation  Date/Time:  Monday October 10 2020 18:38:55 EDT Ventricular Rate:  82 PR Interval:    QRS Duration: 104 QT Interval:  378 QTC Calculation: 441 R Axis:   -14 Text Interpretation: Sinus rhythm Incomplete right bundle branch block Anteroseptal infarct , age undetermined Abnormal ECG When compared with ECG of EARLIER SAME DATE No significant change was found Confirmed by Delora Fuel ([id number]) on 10/11/2020 1:12:55 AM       Radiology DG Chest 2 View  Result Date: 10/10/2020 CLINICAL DATA:  Chest pain EXAM: CHEST - 2 VIEW COMPARISON:  Multiple priors including most recent chest radiograph March 28, 2018 and chest CT April 14, 2015 FINDINGS: Stable cardiac enlargement. Central vascular prominence. Aortic atherosclerosis. Trace bilateral pleural effusions. No focal consolidation. No overt pulmonary edema. Thoracic spondylosis. Degenerative changes bilateral shoulders and AC joints. IMPRESSION: Stable cardiac enlargement with central vascular prominence and trace bilateral pleural effusions, no overt pulmonary edema. Aortic atherosclerosis Aortic Atherosclerosis (ICD10-I70.0). Electronically Signed   By: Dahlia Bailiff MD   On: 10/10/2020 19:28    Procedures Procedures   Medications Ordered in ED Medications  albuterol (VENTOLIN HFA) 108 (90 Base) MCG/ACT inhaler 4 puff (has no administration in time range)  aspirin chewable tablet 324 mg (has no administration in time range)  predniSONE (DELTASONE) tablet 60 mg (has no administration in time range)    ED Course  I have reviewed the triage vital signs and the nursing notes.  Pertinent labs & imaging results that were available during my care of the patient were reviewed by me and considered in my medical decision making (see chart for  details).  MDM Rules/Calculators/A&P Asthma exacerbation.  Chest pain could be related to the asthma.  There are no ECG changes suggestive of ischemia and chest x-ray shows no evidence of pneumonia.  However, troponin is mildly elevated, repeat troponin is stable.  Since patient is anticoagulated, recurrent pulmonary embolism is unlikely.  Will check orthostatic vital signs, third troponin and give additional albuterol.  We will also start on prednisone.  At this point, I think the most likely etiology of her symptoms is her asthma.  Old records were reviewed, and she did have a negative Lexiscan stress test in 2019, echocardiogram in 2016 showed grade 2 diastolic dysfunction.  Repeat troponin is normal.  BNP is moderately elevated at 645.  Patient now tells me that she actually was started on prednisone 3 days ago.  That makes asthma much less likely, as she should have improved with taking prednisone.  I now feel that heart failure is the cause of her dyspnea.  Chest pain has completely resolved.  She is discharged with prescription for furosemide and is referred back to her cardiologist.  Final Clinical Impression(s) / ED Diagnoses Final diagnoses:  Shortness of breath  Chest pain, unspecified type  Elevated troponin  Elevated brain natriuretic peptide (BNP) level  Chronic anticoagulation    Rx / DC Orders ED Discharge Orders         Ordered    furosemide (LASIX) 40 MG tablet  Daily        10/11/20 0343    potassium chloride SA (KLOR-CON) 20 MEQ tablet  2 times daily        10/11/20 7737           Delora Fuel, MD 36/68/15 [phone]

## 2020-10-12 ENCOUNTER — Telehealth: Payer: Self-pay | Admitting: Allergy and Immunology

## 2020-10-12 ENCOUNTER — Telehealth: Payer: Self-pay | Admitting: Cardiovascular Disease

## 2020-10-12 NOTE — Telephone Encounter (Signed)
Pt c/o medication issue:  1. Name of Medication: tolterodine (DETROL) 2 MG tablet  2. How are you currently taking this medication (dosage and times per day)? One a day  3. Are you having a reaction (difficulty breathing--STAT)? no  4. What is your medication issue? Patient wants to know if she still should take this medicine because she was in the ER on Monday and the dr there prescribe her furosemide (LASIX) 40 MG tablet. Please advise

## 2020-10-12 NOTE — Telephone Encounter (Signed)
I contacted [facility] by telephone.  Apparently she had chest pain and shortness of breath and went to the hospital and she had an elevated BNP and she has been placed on furosemide and potassium and she has an appointment to see her cardiologist on 13 April and has a echocardiogram scheduled as well.  She has documented pulmonary hypertension and diastolic dysfunction and has some strain on her heart and she is probably going to do better with the furosemide but certainly needs to keep her appointment with cardiology.  This does not appear to be an asthmatic issue and were not going to change any of her therapy at this point.

## 2020-10-12 NOTE — Telephone Encounter (Signed)
Patient called and said that she went into the hospital mon and they let her out yesterday and she is having some wheezing and her asthma is acting up. cell  [phone]/// home [phone]

## 2020-10-12 NOTE — Telephone Encounter (Signed)
Spoke with the patient and advised her to continue taking both Lasix and Detrol. Patient verbalized understanding.

## 2020-10-12 NOTE — Telephone Encounter (Signed)
Please advise 

## 2020-10-14 ENCOUNTER — Encounter: Payer: Self-pay | Admitting: Family Medicine

## 2020-10-14 ENCOUNTER — Ambulatory Visit (INDEPENDENT_AMBULATORY_CARE_PROVIDER_SITE_OTHER): Payer: Medicare Other | Admitting: Family Medicine

## 2020-10-14 ENCOUNTER — Other Ambulatory Visit: Payer: Self-pay

## 2020-10-14 VITALS — BP 130/70 | HR 64 | Temp 97.3°F | Ht 63.5 in | Wt 203.0 lb

## 2020-10-14 DIAGNOSIS — R0602 Shortness of breath: Secondary | ICD-10-CM | POA: Insufficient documentation

## 2020-10-14 DIAGNOSIS — I480 Paroxysmal atrial fibrillation: Secondary | ICD-10-CM

## 2020-10-14 NOTE — Progress Notes (Signed)
Patient ID: [name], female    DOB: 1946/06/25, 75 y.o.   MRN: [id number]  This visit was conducted in person.  BP 130/70   Pulse 64   Temp (!) 97.3 F (36.3 C) (Temporal)   Ht 5' 3.5" (1.613 m)   Wt 203 lb (92.1 kg)   SpO2 98%   BMI 35.40 kg/m    CC:  Chief Complaint  Patient presents with  . Follow-up    ED Visit-SOB/CP/Dizziness    Subjective:   HPI: [name] is a 75 y.o. female presenting on 10/14/2020 for Follow-up (ED Visit-SOB/CP/Dizziness)  Seen in ED on 10/10/20 or CP, SOB and dizziness/near syncope  Felt CP was related to asthma exacerbation vs CHF.Marland Kitchen no EKG changes or CXR findings. She had noted some swelling over few weeks.  Noted wheezing. Mildly elevated stable troponins x 2.  BNP elevated at 645   She was started on prednisone 3 days prior to ER visit... per asthma Dr. She was started on lasix 40 mg daily. She is having trouble taking potassium.     She is urinating frequently now. Wt Readings from Last 3 Encounters:  10/14/20 203 lb (92.1 kg)  10/07/20 212 lb 3.2 oz (96.3 kg)  08/16/20 209 lb 8 oz (95 kg)   She has noted some improvement in wheeze and SOB.  Swelling in ankles is better. She has appt and ECHO scheduled with Dr. Lorenza Cambridge   Has appt upcoming also with Dr. Cruzita Lederer. Recent TSH stable  Relevant past medical, surgical, family and social history reviewed and updated as indicated. Interim medical history since our last visit reviewed. Allergies and medications reviewed and updated. Outpatient Medications Prior to Visit  Medication Sig Dispense Refill  . acetaminophen (TYLENOL) 650 MG CR tablet Take 1,300 mg by mouth daily as needed for pain.    . Acetaminophen-Codeine 300-30 MG tablet     . ADVAIR HFA 230-21 MCG/ACT inhaler INHALE 2 PUFFS BY MOUTH TWICE DAILY TO PREVENT COUGH OR WHEEZING, RINSE MOUTH AFTER USE 12 g 4  . albuterol (VENTOLIN HFA) 108 (90 Base) MCG/ACT inhaler INHALE 2 PUFFS BY MOUTH INTO THE LUNGS EVERY 6  HOURS AS NEEDED FOR WHEEZING OR SHORTNESS OF BREATH 54 g 0  . antiseptic oral rinse (BIOTENE) LIQD 15 mLs by Mouth Rinse route in the morning and at bedtime.    Marland Kitchen Apoaequorin (PREVAGEN) 10 MG CAPS Take 10 mg by mouth daily.    . Artificial Saliva (BIOTENE DRY MOUTH) LOZG Use as directed 1 lozenge in the mouth or throat daily as needed (dry eyes).    Marland Kitchen atorvastatin (LIPITOR) 10 MG tablet Take 1 tablet (10 mg total) by mouth daily. 90 tablet 3  . Blood Glucose Monitoring Suppl (ONETOUCH VERIO) w/Device KIT Use to check blood sugar up to 2 times a day 1 kit 0  . Capsaicin-Menthol (SALONPAS GEL EX) Apply 1 application topically daily as needed (pain).    . Coenzyme Q10 (COQ-10) 100 MG CAPS Take 100 mg by mouth daily.    . cyclobenzaprine (FLEXERIL) 10 MG tablet TAKE 1 TABLET(10 MG) BY MOUTH AT BEDTIME AS NEEDED FOR MUSCLE SPASMS 30 tablet 1  . dextromethorphan-guaiFENesin (MUCINEX DM) 30-600 MG 12hr tablet Take 1 tablet by mouth 2 (two) times daily as needed for cough.    . diazepam (VALIUM) 10 MG tablet     . diclofenac Sodium (VOLTAREN) 1 % GEL Apply 2 g topically 4 (four) times daily as needed.    Marland Kitchen  EPINEPHrine 0.3 mg/0.3 mL IJ SOAJ injection USE AS DIRECTED FOR LIFE THREATENING ALLERGIC REACTIONS 2 each 2  . famotidine (PEPCID) 40 MG tablet TAKE 1 TABLET(40 MG) BY MOUTH DAILY 30 tablet 3  . Ferrous Sulfate (IRON) 325 (65 Fe) MG TABS Take 1 tablet by mouth daily.    . fluticasone (FLOVENT HFA) 220 MCG/ACT inhaler INHALE 2 PUFFS BY MOUTH TWICE DAILY DURING FLARE UP AS NEEDED. RINSE, GARGLE AND SPIT AFTER USE 12 g 1  . furosemide (LASIX) 40 MG tablet Take 1 tablet (40 mg total) by mouth daily. 30 tablet 0  . gabapentin (NEURONTIN) 600 MG tablet TAKE 1 TABLET BY MOUTH DAILY AT BREAKFAST, 1 TABLET AT LUNCH, AND 2 TABLETS AT BEDTIME 360 tablet 1  . Garlic 194 MG TBEC Take 1 tablet by mouth daily.    Marland Kitchen GLYXAMBI 10-5 MG TABS TAKE 1 TABLET BY MOUTH DAILY 30 tablet 11  . Homeopathic Products [facility]  RELIEF EX) Apply 1 spray topically daily as needed (muscle cramps).    . hydrOXYzine (ATARAX/VISTARIL) 10 MG tablet Take 1 tablet (10 mg total) by mouth daily as needed for itching. 15 tablet 0  . ipratropium (ATROVENT) 0.06 % nasal spray Place 2 sprays into both nostrils 2 (two) times daily as needed for rhinitis.    Marland Kitchen irbesartan (AVAPRO) 150 MG tablet TAKE 1 TABLET(150 MG) BY MOUTH DAILY 30 tablet 8  . Lactase (DAIRY-RELIEF PO) Take 1 capsule by mouth daily as needed (eating dairy).     Marland Kitchen levalbuterol (XOPENEX) 1.25 MG/3ML nebulizer solution USE 1 VIAL VIA NEBULIZER EVERY 6 HOURS AS NEEDED FOR WHEEZING OR SHORTNESS OF BREATH 9 mL 1  . meclizine (ANTIVERT) 12.5 MG tablet Take 1 tablet (12.5 mg total) by mouth 3 (three) times daily as needed for dizziness. 15 tablet 0  . methimazole (TAPAZOLE) 5 MG tablet Take 0.5 tablets (2.5 mg total) by mouth daily. 45 tablet 3  . Misc Natural Products (GLUCOSAMINE CHONDROITIN TRIPLE) TABS Take 2 tablets by mouth daily.    . montelukast (SINGULAIR) 10 MG tablet TAKE 1 TABLET(10 MG) BY MOUTH DAILY 90 tablet 1  . mupirocin cream (BACTROBAN) 2 % Apply 1 application topically 2 (two) times daily. 15 g 0  . omalizumab (XOLAIR) 150 MG injection Inject 300 mg into the skin every 28 (twenty-eight) days.    . ondansetron (ZOFRAN-ODT) 4 MG disintegrating tablet Take 4 mg by mouth every 8 (eight) hours as needed for nausea or vomiting.     [name] Delica Lancets 17E MISC Use to check blood sugar up to 2 times a day. 100 each 5  . ONETOUCH VERIO test strip Use to check blood sugar up to 2 times a day 100 each 5  . oxybutynin (DITROPAN-XL) 5 MG 24 hr tablet Take 5 mg by mouth daily.    . pantoprazole (PROTONIX) 40 MG tablet TAKE 1 TABLET(40 MG) BY MOUTH DAILY 90 tablet 1  . PATADAY 0.2 % SOLN Place 1 drop into both eyes daily as needed (allergies).   4  . potassium chloride SA (KLOR-CON) 20 MEQ tablet Take 1 tablet (20 mEq total) by mouth 2 (two) times daily. 60 tablet 0   . Tiotropium Bromide Monohydrate (SPIRIVA RESPIMAT) 1.25 MCG/ACT AERS Inhale 2 puffs into the lungs daily. 4 g 5  . tolterodine (DETROL) 2 MG tablet Take 2 mg by mouth daily.    Marland Kitchen triamcinolone (KENALOG) 0.1 % Apply 1 application topically 2 (two) times daily as needed.    Marland Kitchen  TRULANCE 3 MG TABS Take 3 mg by mouth daily.     . TURMERIC PO Take 1,000 mg by mouth daily.    Alveda Reasons 20 MG TABS tablet TAKE 1 TABLET(20 MG) BY MOUTH DAILY WITH SUPPER 90 tablet 1   Facility-Administered Medications Prior to Visit  Medication Dose Route Frequency Provider Last Rate Last Admin  . omalizumab Arvid Right) injection 300 mg  300 mg Subcutaneous Q28 days [name], MD   300 mg at 09/06/20 1124  . omalizumab Arvid Right) prefilled syringe 300 mg  300 mg Subcutaneous Q28 days Garnet Sierras, DO   300 mg at 10/07/20 1507     Per HPI unless specifically indicated in ROS section below Review of Systems  Constitutional: Negative for fatigue and fever.  HENT: Negative for congestion.   Eyes: Negative for pain.  Respiratory: Positive for cough and shortness of breath.   Cardiovascular: Negative for chest pain, palpitations and leg swelling.  Gastrointestinal: Negative for abdominal pain.  Genitourinary: Negative for dysuria and vaginal bleeding.  Musculoskeletal: Negative for back pain.  Neurological: Negative for syncope, light-headedness and headaches.  Psychiatric/Behavioral: Negative for dysphoric mood.   Objective:  BP 130/70   Pulse 64   Temp (!) 97.3 F (36.3 C) (Temporal)   Ht 5' 3.5" (1.613 m)   Wt 203 lb (92.1 kg)   SpO2 98%   BMI 35.40 kg/m   Wt Readings from Last 3 Encounters:  10/14/20 203 lb (92.1 kg)  10/07/20 212 lb 3.2 oz (96.3 kg)  08/16/20 209 lb 8 oz (95 kg)      Physical Exam Constitutional:      General: She is not in acute distress.    Appearance: Normal appearance. She is well-developed. She is obese. She is not ill-appearing or toxic-appearing.  HENT:     Head:  Normocephalic.     Right Ear: Hearing, tympanic membrane, ear canal and external ear normal. Tympanic membrane is not erythematous, retracted or bulging.     Left Ear: Hearing, tympanic membrane, ear canal and external ear normal. Tympanic membrane is not erythematous, retracted or bulging.     Nose: No mucosal edema or rhinorrhea.     Right Sinus: No maxillary sinus tenderness or frontal sinus tenderness.     Left Sinus: No maxillary sinus tenderness or frontal sinus tenderness.     Mouth/Throat:     Pharynx: Uvula midline.  Eyes:     General: Lids are normal. Lids are everted, no foreign bodies appreciated.     Conjunctiva/sclera: Conjunctivae normal.     Pupils: Pupils are equal, round, and reactive to light.  Neck:     Thyroid: No thyroid mass or thyromegaly.     Vascular: No carotid bruit.     Trachea: Trachea normal.  Cardiovascular:     Rate and Rhythm: Normal rate and regular rhythm.     Pulses: Normal pulses.     Heart sounds: Normal heart sounds, S1 normal and S2 normal. No murmur heard. No friction rub. No gallop.   Pulmonary:     Effort: Pulmonary effort is normal. No tachypnea or respiratory distress.     Breath sounds: Normal breath sounds. No decreased breath sounds, wheezing, rhonchi or rales.  Abdominal:     General: Bowel sounds are normal.     Palpations: Abdomen is soft.     Tenderness: There is no abdominal tenderness.  Musculoskeletal:     Cervical back: Normal range of motion and neck supple.  Skin:  General: Skin is warm and dry.     Findings: No rash.  Neurological:     Mental Status: She is alert.  Psychiatric:        Mood and Affect: Mood is not anxious or depressed.        Speech: Speech normal.        Behavior: Behavior normal. Behavior is cooperative.        Thought Content: Thought content normal.        Judgment: Judgment normal.       Results for orders placed or performed during the hospital encounter of 20/35/59  Basic metabolic panel   Result Value Ref Range   Sodium 140 135 - 145 mmol/L   Potassium 3.5 3.5 - 5.1 mmol/L   Chloride 107 98 - 111 mmol/L   CO2 27 22 - 32 mmol/L   Glucose, Bld 107 (H) 70 - 99 mg/dL   BUN 7 (L) 8 - 23 mg/dL   Creatinine, Ser 0.95 0.44 - 1.00 mg/dL   Calcium 8.8 (L) 8.9 - 10.3 mg/dL   GFR, Estimated >60 >60 mL/min   Anion gap 6 5 - 15  CBC  Result Value Ref Range   WBC 9.5 4.0 - 10.5 K/uL   RBC 4.89 3.87 - 5.11 MIL/uL   Hemoglobin 12.7 12.0 - 15.0 g/dL   HCT 38.3 36.0 - 46.0 %   MCV 78.3 (L) 80.0 - 100.0 fL   MCH 26.0 26.0 - 34.0 pg   MCHC 33.2 30.0 - 36.0 g/dL   RDW 17.9 (H) 11.5 - 15.5 %   Platelets 313 150 - 400 K/uL   nRBC 0.0 0.0 - 0.2 %  Brain natriuretic peptide  Result Value Ref Range   B Natriuretic Peptide 645.2 (H) 0.0 - 100.0 pg/mL  Troponin I (High Sensitivity)  Result Value Ref Range   Troponin I (High Sensitivity) 24 (H) <18 ng/L  Troponin I (High Sensitivity)  Result Value Ref Range   Troponin I (High Sensitivity) 22 (H) <18 ng/L  Troponin I (High Sensitivity)  Result Value Ref Range   Troponin I (High Sensitivity) 21 (H) <18 ng/L    This visit occurred during the SARS-CoV-2 public health emergency.  Safety protocols were in place, including screening questions prior to the visit, additional usage of staff PPE, and extensive cleaning of exam room while observing appropriate contact time as indicated for disinfecting solutions.   COVID 19 screen:  No recent travel or known exposure to COVID19 The patient denies respiratory symptoms of COVID 19 at this time. The importance of social distancing was discussed today.   Assessment and Plan     [name], MD

## 2020-10-14 NOTE — Patient Instructions (Signed)
Please stop at the lab to have labs drawn. Continue current dose of lasix and potassium until we call with labs. Keep appt with Cardiology as planned for ECHO.

## 2020-10-15 LAB — BASIC METABOLIC PANEL
BUN/Creatinine Ratio: 13 (calc) (ref 6–22)
BUN: 14 mg/dL (ref 7–25)
CO2: 27 mmol/L (ref 20–32)
Calcium: 9.4 mg/dL (ref 8.6–10.4)
Chloride: 103 mmol/L (ref 98–110)
Creat: 1.09 mg/dL — ABNORMAL HIGH (ref 0.60–0.93)
Glucose, Bld: 159 mg/dL — ABNORMAL HIGH (ref 65–99)
Potassium: 3.9 mmol/L (ref 3.5–5.3)
Sodium: 143 mmol/L (ref 135–146)

## 2020-10-16 ENCOUNTER — Other Ambulatory Visit: Payer: Self-pay | Admitting: Family Medicine

## 2020-10-17 DIAGNOSIS — Z79899 Other long term (current) drug therapy: Secondary | ICD-10-CM | POA: Diagnosis not present

## 2020-10-17 DIAGNOSIS — M25561 Pain in right knee: Secondary | ICD-10-CM | POA: Diagnosis not present

## 2020-10-17 DIAGNOSIS — M5136 Other intervertebral disc degeneration, lumbar region: Secondary | ICD-10-CM | POA: Diagnosis not present

## 2020-10-17 DIAGNOSIS — M25761 Osteophyte, right knee: Secondary | ICD-10-CM | POA: Diagnosis not present

## 2020-10-17 DIAGNOSIS — G8929 Other chronic pain: Secondary | ICD-10-CM | POA: Diagnosis not present

## 2020-10-17 DIAGNOSIS — M47814 Spondylosis without myelopathy or radiculopathy, thoracic region: Secondary | ICD-10-CM | POA: Diagnosis not present

## 2020-10-17 DIAGNOSIS — Z809 Family history of malignant neoplasm, unspecified: Secondary | ICD-10-CM | POA: Diagnosis not present

## 2020-10-17 DIAGNOSIS — M545 Low back pain, unspecified: Secondary | ICD-10-CM | POA: Diagnosis not present

## 2020-10-18 ENCOUNTER — Other Ambulatory Visit: Payer: Medicare Other

## 2020-10-18 ENCOUNTER — Other Ambulatory Visit: Payer: Self-pay

## 2020-10-18 ENCOUNTER — Other Ambulatory Visit (INDEPENDENT_AMBULATORY_CARE_PROVIDER_SITE_OTHER): Payer: Medicare Other

## 2020-10-18 DIAGNOSIS — E0500 Thyrotoxicosis with diffuse goiter without thyrotoxic crisis or storm: Secondary | ICD-10-CM

## 2020-10-18 LAB — TSH: TSH: 4.38 u[IU]/mL (ref 0.35–4.50)

## 2020-10-18 LAB — T3, FREE: T3, Free: 2.3 pg/mL (ref 2.3–4.2)

## 2020-10-18 LAB — T4, FREE: Free T4: 0.9 ng/dL (ref 0.60–1.60)

## 2020-10-20 ENCOUNTER — Other Ambulatory Visit: Payer: Self-pay | Admitting: Family Medicine

## 2020-10-20 ENCOUNTER — Telehealth: Payer: Self-pay

## 2020-10-20 DIAGNOSIS — E1140 Type 2 diabetes mellitus with diabetic neuropathy, unspecified: Secondary | ICD-10-CM

## 2020-10-20 DIAGNOSIS — E0500 Thyrotoxicosis with diffuse goiter without thyrotoxic crisis or storm: Secondary | ICD-10-CM

## 2020-10-20 MED ORDER — METHIMAZOLE 5 MG PO TABS: 2.5000 mg | ORAL_TABLET | Freq: Every day | ORAL | 0 refills | Status: DC

## 2020-10-20 MED ORDER — METHIMAZOLE 5 MG PO TABS: 2.5000 mg | ORAL_TABLET | ORAL | 0 refills | Status: DC

## 2020-10-20 NOTE — Telephone Encounter (Signed)
Called and left a detailed message for pt advising adjustment to medication and requested a call back to schedule lab appt. Labs ordered.

## 2020-10-20 NOTE — Telephone Encounter (Signed)
Pt advised to take 0.5 tablet daily. Med list updated.

## 2020-10-20 NOTE — Addendum Note (Signed)
Addended by: Lauralyn Primes on: 10/20/2020 01:17 PM   Modules accepted: Orders

## 2020-10-20 NOTE — Telephone Encounter (Signed)
Pt called back to advise she has been taking a whole tablet (5 mg) of Methimizole everyday and wanted to ensure she should not take half a tablet every day before decreasing to every other day. Previously her levels dropped too low when taking half a tablet (2.5 mg) every other day.

## 2020-10-20 NOTE — Telephone Encounter (Signed)
T, yes, in that case, she will need to decrease it only to 2.5 mg daily.  Can you please changes in her medication list?

## 2020-10-20 NOTE — Telephone Encounter (Signed)
-----   Message from Philemon Kingdom, MD sent at 10/18/2020  5:20 PM EDT ----- Can you please call pt.:  TSH level is still normal, but higher in the normal range.  Please advise her to take 2.5 mg of methimazole (half a tablet) every other day and let us repeat the TSH, free T4, free T3 in 5 weeks.  Can you please order these and change the methimazole dose on her medication list?

## 2020-10-21 ENCOUNTER — Telehealth: Payer: Self-pay | Admitting: Family Medicine

## 2020-10-21 NOTE — Telephone Encounter (Signed)
[name] notified as instructed by telephone.  Patient states understanding.

## 2020-10-21 NOTE — Telephone Encounter (Signed)
[name] called in and stated that she has been feeling like she has no energy and wanted to know if she should be taking vitamins.   Please advise

## 2020-10-21 NOTE — Telephone Encounter (Signed)
Okay to  take multivitamin for geriatric people ( no iron)

## 2020-10-24 ENCOUNTER — Other Ambulatory Visit: Payer: Self-pay

## 2020-10-24 ENCOUNTER — Ambulatory Visit (INDEPENDENT_AMBULATORY_CARE_PROVIDER_SITE_OTHER): Payer: Medicare Other | Admitting: Podiatry

## 2020-10-24 DIAGNOSIS — M2041 Other hammer toe(s) (acquired), right foot: Secondary | ICD-10-CM

## 2020-10-24 DIAGNOSIS — M2042 Other hammer toe(s) (acquired), left foot: Secondary | ICD-10-CM

## 2020-10-24 DIAGNOSIS — E1142 Type 2 diabetes mellitus with diabetic polyneuropathy: Secondary | ICD-10-CM

## 2020-10-24 NOTE — Progress Notes (Signed)
Patient presented for foam casting for 3 pair custom diabetic shoe inserts. Patient is measured with a [employer] Device to be a size 12 wide.  Diabetic shoes are chosen from the safe step catalog. The shoes chosen are [id number]  The patient will be contacted when the shoes and inserts are ready to be picked up.

## 2020-10-25 ENCOUNTER — Ambulatory Visit: Payer: Medicare Other | Admitting: Allergy and Immunology

## 2020-10-26 ENCOUNTER — Other Ambulatory Visit: Payer: Self-pay

## 2020-10-26 ENCOUNTER — Encounter: Payer: Self-pay | Admitting: Cardiovascular Disease

## 2020-10-26 ENCOUNTER — Ambulatory Visit (INDEPENDENT_AMBULATORY_CARE_PROVIDER_SITE_OTHER): Payer: Medicare Other | Admitting: Cardiovascular Disease

## 2020-10-26 VITALS — BP 90/54 | HR 70 | Ht 64.0 in | Wt 199.2 lb

## 2020-10-26 DIAGNOSIS — I5030 Unspecified diastolic (congestive) heart failure: Secondary | ICD-10-CM | POA: Diagnosis not present

## 2020-10-26 DIAGNOSIS — I10 Essential (primary) hypertension: Secondary | ICD-10-CM | POA: Diagnosis not present

## 2020-10-26 DIAGNOSIS — I484 Atypical atrial flutter: Secondary | ICD-10-CM | POA: Diagnosis not present

## 2020-10-26 DIAGNOSIS — I2721 Secondary pulmonary arterial hypertension: Secondary | ICD-10-CM | POA: Diagnosis not present

## 2020-10-26 DIAGNOSIS — Z7901 Long term (current) use of anticoagulants: Secondary | ICD-10-CM

## 2020-10-26 DIAGNOSIS — I5033 Acute on chronic diastolic (congestive) heart failure: Secondary | ICD-10-CM | POA: Diagnosis not present

## 2020-10-26 DIAGNOSIS — E668 Other obesity: Secondary | ICD-10-CM | POA: Diagnosis not present

## 2020-10-26 DIAGNOSIS — E7800 Pure hypercholesterolemia, unspecified: Secondary | ICD-10-CM | POA: Diagnosis not present

## 2020-10-26 DIAGNOSIS — Z86711 Personal history of pulmonary embolism: Secondary | ICD-10-CM | POA: Diagnosis not present

## 2020-10-26 DIAGNOSIS — R072 Precordial pain: Secondary | ICD-10-CM

## 2020-10-26 DIAGNOSIS — J4540 Moderate persistent asthma, uncomplicated: Secondary | ICD-10-CM | POA: Diagnosis not present

## 2020-10-26 DIAGNOSIS — E0590 Thyrotoxicosis, unspecified without thyrotoxic crisis or storm: Secondary | ICD-10-CM | POA: Diagnosis not present

## 2020-10-26 DIAGNOSIS — E1140 Type 2 diabetes mellitus with diabetic neuropathy, unspecified: Secondary | ICD-10-CM

## 2020-10-26 NOTE — Patient Instructions (Signed)
Medication Instructions:  Your physician recommends that you continue on your current medications as directed. Please refer to the Current Medication list given to you today.  *If you need a refill on your cardiac medications before your next appointment, please call your pharmacy*   Lab Work: BNP and BMP to be drawn today.   If you have labs (blood work) drawn today and your tests are completely normal, you will receive your results only by: Marland Kitchen MyChart Message (if you have MyChart) OR . A paper copy in the mail If you have any lab test that is abnormal or we need to change your treatment, we will call you to review the results.   Testing/Procedures:  To be done at [place]. Your physician has requested that you have a lexiscan myoview. For further information please visit [email or web address]. Please follow instruction sheet, as given.     Follow-Up: At [facility], you and your health needs are our priority.  As part of our continuing mission to provide you with exceptional heart care, we have created designated Provider Care Teams.  These Care Teams include your primary Cardiologist (physician) and Advanced Practice Providers (APPs -  Physician Assistants and Nurse Practitioners) who all work together to provide you with the care you need, when you need it.  We recommend signing up for the patient portal called "MyChart".  Sign up information is provided on this After Visit Summary.  MyChart is used to connect with patients for Virtual Visits (Telemedicine).  Patients are able to view lab/test results, encounter notes, upcoming appointments, etc.  Non-urgent messages can be sent to your provider as well.   To learn more about what you can do with MyChart, go to [email or web address].    Your next appointment:   1-2 month(s)  The format for your next appointment:   In Person  Provider:   You may see Sanda Klein, MD or one of the following Advanced Practice  Providers on your designated Care Team:    [name], PA-C  Fabian Sharp, PA-C or   Roby Lofts, [place]

## 2020-10-26 NOTE — Progress Notes (Signed)
Cardiology Office Note    Date:  10/27/2020   ID:  Kristal, Perl Aug 03, 1945, MRN [id number]  PCP:  [name], MD  Cardiologist:   Sanda Klein, MD   Chief complaint: ED CHF visit follow-up   History of Present Illness:  [name] is a 75 y.o. female with paroxysmal atrial fibrillation, remote DVT/PE, moderate pulmonary hypertension, chronic reactive airway disease and degenerative joint disease returning for follow-up.  She was seen in the emergency room on 10/10/2020 with complaints of both shortness of breath and chest discomfort.  She appeared to describe orthopnea.  She is also had a retrosternal discomfort that has occurred episodically and not predictably.  It occurred after a meal initially, but then recurred a few days later not associated with a meal.  She describes it as a "gas" sensation in her chest, but belching does not always improve the discomfort.  She noticed increased wheezing for about 4 days before her emergency room visit.  She had already been taking some prednisone because of this.  She also had ankle swelling.  She had not been taking any furosemide in months and she has not had any fluid issues.  The emergency room evaluation showed no significant elevation in high-sensitivity troponin (24-22) and her ECG was interpreted as showing sinus rhythm and did not show any ischemic changes.  Her BNP was markedly elevated at 645 (baseline BNP less than 100) and the chest x-ray showed "stable cardiac enlargement.  Central vascular prominence.  Aortic atherosclerosis.  Trace bilateral effusions".  She was given a prescription for diuretics.  An echocardiogram has been ordered but has not yet been performed.  Today in the office she reports that her breathing is markedly improved.  She feels a little tired.  She has lost 13 pounds in weight in the last couple of weeks.  Labs were checked today and show that her BNP level is now normal and her BUN and creatinine  are higher than baseline.  She has had a few more episodes of "gas" in her chest, but this is improving.  She no longer has any edema.  Prior to her emergency room visit she generally been doing well.  She has been focusing a lot on improving her diet and is mostly vegetarian, even considering moving to a vegan diet.  She has lost substantial weight and is now only moderately obese.  Her most recent hemoglobin A1c was 6.2% and her most recent LDL cholesterol was 56.  She has a lot of problems with pain in her back and pain in her right knee and she will be seeing a new orthopedic surgeon to discuss right knee replacement.  She is compliant with Xarelto and has not had any serious falls, injuries or bleeding problems.  She denies dizziness, syncope, palpitations, focal neurological complaints or intermittent claudication.  Activity is limited by her orthopedic complaints.  Her last documented episode of atrial fibrillation was about 5 years ago.    Previous coronary angiography showed no evidence of CAD but she did have moderate pulmonary artery hypertension by right heart catheterization (systolic PAP 50 mmHg).  She has had a variety of noncardiac issues including recurrent issues with diarrhea (she is seeing Dr. Cristina Gong) and continued hyperthyroidism controlled on methimazole (she is seeing Dr. Cruzita Lederer).    She continues to have issues with thoracic spine related pain (Dr. Gladstone Lighter). She has had a previous equivocal workup for hypercoagulable conditions (lupus anticoagulant positive on one of  2 separate assays, protein S activity decreased with normal total protein S level).    Past Medical History:  Diagnosis Date  . Allergic rhinitis   . Allergy   . Arthritis   . Asthma   . Chronic headache   . Diabetes mellitus   . DVT (deep venous thrombosis) ([place])   . Dyspnea   . Heart murmur   . Hypertension   . Penicillin allergy 01/19/2020  . Pulmonary embolism ([place])   . Pulmonary hypertension ([place])    . Seizures ([place])     Past Surgical History:  Procedure Laterality Date  . ABDOMINAL HYSTERECTOMY     partial, has ovaries  . CARDIAC CATHETERIZATION  12/28/2010   Mod. pulmonary hypertension, normal coronary arteries  . DOPPLER ECHOCARDIOGRAPHY  10/08/2011   EF=>55%,mild asymmetric LVH, mod. TR, mod. PH, mild to mod LA dilatation  . IR LUMBAR DISC ASPIRATION W/IMG GUIDE  01/12/2020  . KNEE ARTHROSCOPY Left   . KNEE SURGERY    . Nuclear Stress Test  05/20/2006   No ischemia  . PARTIAL HYSTERECTOMY    . PLANTAR FASCIA SURGERY    . TONSILLECTOMY      Current Medications: Outpatient Medications Prior to Visit  Medication Sig Dispense Refill  . acetaminophen (TYLENOL) 650 MG CR tablet Take 1,300 mg by mouth daily as needed for pain.    . Acetaminophen-Codeine 300-30 MG tablet     . ADVAIR HFA 230-21 MCG/ACT inhaler INHALE 2 PUFFS BY MOUTH TWICE DAILY TO PREVENT COUGH OR WHEEZING, RINSE MOUTH AFTER USE 12 g 4  . albuterol (VENTOLIN HFA) 108 (90 Base) MCG/ACT inhaler INHALE 2 PUFFS BY MOUTH INTO THE LUNGS EVERY 6 HOURS AS NEEDED FOR WHEEZING OR SHORTNESS OF BREATH 54 g 0  . antiseptic oral rinse (BIOTENE) LIQD 15 mLs by Mouth Rinse route in the morning and at bedtime.    Marland Kitchen Apoaequorin (PREVAGEN) 10 MG CAPS Take 10 mg by mouth daily.    . Artificial Saliva (BIOTENE DRY MOUTH) LOZG Use as directed 1 lozenge in the mouth or throat daily as needed (dry eyes).    Marland Kitchen atorvastatin (LIPITOR) 10 MG tablet TAKE 1 TABLET(10 MG) BY MOUTH DAILY 90 tablet 1  . Blood Glucose Monitoring Suppl (ONETOUCH VERIO) w/Device KIT Use to check blood sugar up to 2 times a day 1 kit 0  . Capsaicin-Menthol (SALONPAS GEL EX) Apply 1 application topically daily as needed (pain).    . Coenzyme Q10 (COQ-10) 100 MG CAPS Take 100 mg by mouth daily.    . cyclobenzaprine (FLEXERIL) 10 MG tablet TAKE 1 TABLET(10 MG) BY MOUTH AT BEDTIME AS NEEDED FOR MUSCLE SPASMS 30 tablet 1  . dextromethorphan-guaiFENesin (MUCINEX DM) 30-600  MG 12hr tablet Take 1 tablet by mouth 2 (two) times daily as needed for cough.    . diazepam (VALIUM) 10 MG tablet     . diclofenac Sodium (VOLTAREN) 1 % GEL Apply 2 g topically 4 (four) times daily as needed.    . DULoxetine (CYMBALTA) 30 MG capsule     . EPINEPHrine 0.3 mg/0.3 mL IJ SOAJ injection USE AS DIRECTED FOR LIFE THREATENING ALLERGIC REACTIONS 2 each 2  . famotidine (PEPCID) 40 MG tablet TAKE 1 TABLET(40 MG) BY MOUTH DAILY 30 tablet 3  . Ferrous Sulfate (IRON) 325 (65 Fe) MG TABS Take 1 tablet by mouth daily.    . fluticasone (FLOVENT HFA) 220 MCG/ACT inhaler INHALE 2 PUFFS BY MOUTH TWICE DAILY DURING FLARE UP AS NEEDED. RINSE, GARGLE  AND SPIT AFTER USE 12 g 1  . furosemide (LASIX) 40 MG tablet Take 1 tablet (40 mg total) by mouth daily. 30 tablet 0  . gabapentin (NEURONTIN) 600 MG tablet TAKE 1 TABLET BY MOUTH DAILY AT BREAKFAST, 1 TABLET AT LUNCH, AND 2 TABLETS AT BEDTIME 360 tablet 1  . Garlic 093 MG TBEC Take 1 tablet by mouth daily.    Marland Kitchen GLYXAMBI 10-5 MG TABS TAKE 1 TABLET BY MOUTH DAILY 30 tablet 11  . Homeopathic Products [facility] RELIEF EX) Apply 1 spray topically daily as needed (muscle cramps).    . hydrOXYzine (ATARAX/VISTARIL) 10 MG tablet Take 1 tablet (10 mg total) by mouth daily as needed for itching. 15 tablet 0  . ipratropium (ATROVENT) 0.06 % nasal spray Place 2 sprays into both nostrils 2 (two) times daily as needed for rhinitis.    Marland Kitchen irbesartan (AVAPRO) 150 MG tablet TAKE 1 TABLET(150 MG) BY MOUTH DAILY 30 tablet 8  . Lactase (DAIRY-RELIEF PO) Take 1 capsule by mouth daily as needed (eating dairy).     . Lancets (ONETOUCH DELICA PLUS [id number]) MISC USE TO CHECK BLOOD SUGAR UP TO TWICE DAILY 100 each 5  . levalbuterol (XOPENEX) 1.25 MG/3ML nebulizer solution USE 1 VIAL VIA NEBULIZER EVERY 6 HOURS AS NEEDED FOR WHEEZING OR SHORTNESS OF BREATH 9 mL 1  . meclizine (ANTIVERT) 12.5 MG tablet Take 1 tablet (12.5 mg total) by mouth 3 (three) times daily as needed for  dizziness. 15 tablet 0  . methimazole (TAPAZOLE) 5 MG tablet Take 0.5 tablets (2.5 mg total) by mouth daily. 45 tablet 0  . Misc Natural Products (GLUCOSAMINE CHONDROITIN TRIPLE) TABS Take 2 tablets by mouth daily.    . montelukast (SINGULAIR) 10 MG tablet TAKE 1 TABLET(10 MG) BY MOUTH DAILY 90 tablet 1  . mupirocin cream (BACTROBAN) 2 % Apply 1 application topically 2 (two) times daily. 15 g 0  . omalizumab (XOLAIR) 150 MG injection Inject 300 mg into the skin every 28 (twenty-eight) days.    . ondansetron (ZOFRAN-ODT) 4 MG disintegrating tablet Take 4 mg by mouth every 8 (eight) hours as needed for nausea or vomiting.     [name] VERIO test strip USE TO CHECK BLOOD SUGAR UP TO TWICE DAILY 100 strip 5  . oxybutynin (DITROPAN-XL) 5 MG 24 hr tablet Take 5 mg by mouth daily.    . pantoprazole (PROTONIX) 40 MG tablet TAKE 1 TABLET(40 MG) BY MOUTH DAILY 90 tablet 1  . PATADAY 0.2 % SOLN Place 1 drop into both eyes daily as needed (allergies).   4  . potassium chloride SA (KLOR-CON) 20 MEQ tablet Take 1 tablet (20 mEq total) by mouth 2 (two) times daily. 60 tablet 0  . Tiotropium Bromide Monohydrate (SPIRIVA RESPIMAT) 1.25 MCG/ACT AERS Inhale 2 puffs into the lungs daily. 4 g 5  . tolterodine (DETROL) 2 MG tablet Take 2 mg by mouth daily.    Marland Kitchen triamcinolone (KENALOG) 0.1 % Apply 1 application topically 2 (two) times daily as needed.    . TRULANCE 3 MG TABS Take 3 mg by mouth daily.     . TURMERIC PO Take 1,000 mg by mouth daily.    Alveda Reasons 20 MG TABS tablet TAKE 1 TABLET(20 MG) BY MOUTH DAILY WITH SUPPER 90 tablet 1   Facility-Administered Medications Prior to Visit  Medication Dose Route Frequency Provider Last Rate Last Admin  . omalizumab Arvid Right) injection 300 mg  300 mg Subcutaneous Q28 days Kozlow, Donnamarie Poag, MD  300 mg at 09/06/20 1124  . omalizumab Arvid Right) prefilled syringe 300 mg  300 mg Subcutaneous Q28 days Garnet Sierras, DO   300 mg at 10/07/20 1507     Allergies:   Latex,  Penicillins, Oxycodone-acetaminophen, Lyrica [pregabalin], Nickel, Phenytoin sodium extended, and Vicodin [hydrocodone-acetaminophen]   Social History   Socioeconomic History  . Marital status: Widowed    Spouse name: Not on file  . Number of children: Y  . Years of education: Not on file  . Highest education level: Not on file  Occupational History  . Occupation: retired Geologist, engineering.   Tobacco Use  . Smoking status: Never Smoker  . Smokeless tobacco: Never Used  Vaping Use  . Vaping Use: Never used  Substance and Sexual Activity  . Alcohol use: Not Currently    Alcohol/week: 0.0 standard drinks    Comment: occ glass on wine  . Drug use: No  . Sexual activity: Never  Other Topics Concern  . Not on file  Social History Narrative   Widow    limited exercise.   Social Determinants of Health   Financial Resource Strain: Low Risk   . Difficulty of Paying Living Expenses: Not hard at all  Food Insecurity: Not on file  Transportation Needs: Not on file  Physical Activity: Not on file  Stress: Not on file  Social Connections: Not on file     Family History:  The patient's family history includes Alcohol abuse in her father; Allergies in an other family member; Arthritis in her mother and sister; Breast cancer in her mother; Cancer in her brother; Clotting disorder in her mother; Diabetes in her mother and sister; Hypertension in her mother; Multiple sclerosis in her sister; Stroke in her mother.   ROS:   Please see the history of present illness.    ROS All other systems reviewed and are negative.   PHYSICAL EXAM:   VS:  BP (!) 90/54 (BP Location: Left Arm, Patient Position: Sitting, Cuff Size: Large)   Pulse 70   Ht '5\' 4"'  (1.626 m)   Wt 199 lb 3.2 oz (90.4 kg)   BMI 34.19 kg/m     General: Alert, oriented x3, no distress, moderately obese.  Looks a little tired. Head: no evidence of trauma, PERRL, EOMI, no exophtalmos or lid lag, no myxedema, no xanthelasma; normal  ears, nose and oropharynx Neck: normal jugular venous pulsations and no hepatojugular reflux; brisk carotid pulses without delay and no carotid bruits Chest: clear to auscultation, no signs of consolidation by percussion or palpation, normal fremitus, symmetrical and full respiratory excursions Cardiovascular: normal position and quality of the apical impulse, regular rhythm, normal first and second heart sounds, no murmurs, rubs or gallops Abdomen: no tenderness or distention, no masses by palpation, no abnormal pulsatility or arterial bruits, normal bowel sounds, no hepatosplenomegaly Extremities: no clubbing, cyanosis or edema; 2+ radial, ulnar and brachial pulses bilaterally; 2+ right femoral, posterior tibial and dorsalis pedis pulses; 2+ left femoral, posterior tibial and dorsalis pedis pulses; no subclavian or femoral bruits Neurological: grossly nonfocal Psych: Normal mood and affect   Wt Readings from Last 3 Encounters:  10/26/20 199 lb 3.2 oz (90.4 kg)  10/14/20 203 lb (92.1 kg)  10/07/20 212 lb 3.2 oz (96.3 kg)      Studies/Labs Reviewed:   EKG:  EKG is not ordered today.  I personally reviewed the 2 tracings from 10/10/2020 I think they show atrial flutter with 4: 1 AV block and a ventricular  rate of 82 bpm.  There are indeed no ischemic changes. Recent Labs: 04/20/2020: ALT 7 10/10/2020: Hemoglobin 12.7; Platelets 313 10/18/2020: TSH 4.38 10/26/2020: BNP 83.6; BUN 13; Creatinine, Ser 1.23; Potassium 4.2; Sodium 141   Lipid Panel    Component Value Date/Time   CHOL 117 04/20/2020 1131   TRIG 71.0 04/20/2020 1131   HDL 46.60 04/20/2020 1131   CHOLHDL 3 04/20/2020 1131   VLDL 14.2 04/20/2020 1131   LDLCALC 56 04/20/2020 1131     ASSESSMENT:    1. Atypical atrial flutter ([place])   2. Acute on chronic diastolic congestive heart failure ([place])   3. Essential hypertension   4. Precordial pain   5. History of pulmonary embolism   6. PAH (pulmonary artery hypertension) ([place])    7. Type 2 diabetes mellitus with diabetic neuropathy, without long-term current use of insulin ([place])   8. Hypercholesterolemia   9. Moderate obesity   10. Long term current use of anticoagulant   11. Asthma, moderate persistent   12. Hyperthyroidism      PLAN:  In order of problems listed above:  1. AFlutter: I believe the tracing performed in the emergency room may have actually shown atypical atrial flutter with 4: 1 AV block.  Rate control was good.  It is unclear whether this could have caused her heart failure exacerbation episodes, or was a consequence of it.  She is appropriately anticoagulated and did not have any symptoms of palpitations.  CHADSVasc 4 (age, gender, HTN, DM, CHF).  We did not perform an ECG today, but the rate is slower and regular and I suspect she is back in sinus rhythm.  I do not think there is a convincing reason to prescribe antiarrhythmics. 2. CHF: Presumably this was an episode of acute exacerbation of diastolic heart failure, but her echocardiogram has not yet been performed.  If LVEF is indeed still normal, we will continue the current treatment plan.  However if her EF has diminished, we should test for coronary disease. 3. HTN: Her blood pressure was quite low on presentation at 90/54, but when I rechecked it it was 104/63 still rather low for her.  She may be a little bit overdiuresis.  This is confirmed by her elevated BUN and creatinine today and the fact that her BNP has completely normalized.  We received the results of her labs after she left the office, but will call her back to tell her to decrease her dose of furosemide to 20 mg once daily.  Continue irbesartan and empagliflozin. 4. Chest pain: This has been atypical and mostly has features suggestive of GI etiology, but she does have numerous risk factors for coronary disease.  We will schedule her for a Lexiscan Myoview.  We will try to avoid contrast based procedures because of her diabetes and renal  dysfunction, unless there is compelling evidence of ischemic heart disease on her echocardiogram or perfusion study. 5. Hx DVT/PE: Conflicting results on previous hypercoagulable work-up, but this is probably a moot point since she will require chronic lifelong anticoagulation for atrial arrhythmia anyway. 6. PAH: This was present on her previous echocardiogram in 5361 (peak systolic PA pressure estimated at 46 mmHg).  The recent episode of heart failure exacerbation suggest that it is probably at least partly related to left ventricular diastolic dysfunction, but it may also be related to longstanding obesity and previous pulmonary embolism. 7. DM: Excellent control.  She is taking an SGLT2 inhibitor. 8. HLP: All lipid parameters in  desirable range on atorvastatin. 9. Obesity: She is making steady progress with weight loss and has moved follow morbidly obese to just moderately obese range.  Congratulated her on her efforts.  She is on an SGLT2 inhibitor which will help.  Ongoing weight loss will make it a little hard to be confident in her dry weight. 10. Xarelto: Indicated both for atrial fibrillation and history of venous thromboembolism.  No bleeding complications. 11. Asthma: Avoid nonselective beta-blockers.  Is quite possible that her recent episode of/asthma exacerbation" was actually cardiac asthma due to heart failure.  No wheezing at all today. 12. Hyperthyroidism: On chronic methimazole with recent TSH excellent at 4.38.   Medication Adjustments/Labs and Tests Ordered: Current medicines are reviewed at length with the patient today.  Concerns regarding medicines are outlined above.  Medication changes, Labs and Tests ordered today are listed in the Patient Instructions below: Patient Instructions  Medication Instructions:  Your physician recommends that you continue on your current medications as directed. Please refer to the Current Medication list given to you today.  *If you need a  refill on your cardiac medications before your next appointment, please call your pharmacy*   Lab Work: BNP and BMP to be drawn today.   If you have labs (blood work) drawn today and your tests are completely normal, you will receive your results only by: Marland Kitchen MyChart Message (if you have MyChart) OR . A paper copy in the mail If you have any lab test that is abnormal or we need to change your treatment, we will call you to review the results.   Testing/Procedures:  To be done at [place]. Your physician has requested that you have a lexiscan myoview. For further information please visit [email or web address]. Please follow instruction sheet, as given.     Follow-Up: At [facility], you and your health needs are our priority.  As part of our continuing mission to provide you with exceptional heart care, we have created designated Provider Care Teams.  These Care Teams include your primary Cardiologist (physician) and Advanced Practice Providers (APPs -  Physician Assistants and Nurse Practitioners) who all work together to provide you with the care you need, when you need it.  We recommend signing up for the patient portal called "MyChart".  Sign up information is provided on this After Visit Summary.  MyChart is used to connect with patients for Virtual Visits (Telemedicine).  Patients are able to view lab/test results, encounter notes, upcoming appointments, etc.  Non-urgent messages can be sent to your provider as well.   To learn more about what you can do with MyChart, go to [email or web address].    Your next appointment:   1-2 month(s)  The format for your next appointment:   In Person  Provider:   You may see Sanda Klein, MD or one of the following Advanced Practice Providers on your designated Care Team:    [name], PA-C  Fabian Sharp, [place] or   Roby Lofts, PA-C        Signed, Sanda Klein, MD  10/27/2020 2:17 PM    [facility] [address], [place], [place]  [address] Phone: [phone]; Fax: [phone]

## 2020-10-27 ENCOUNTER — Other Ambulatory Visit: Payer: Self-pay | Admitting: *Deleted

## 2020-10-27 LAB — BASIC METABOLIC PANEL
BUN/Creatinine Ratio: 11 — ABNORMAL LOW (ref 12–28)
BUN: 13 mg/dL (ref 8–27)
CO2: 23 mmol/L (ref 20–29)
Calcium: 9.4 mg/dL (ref 8.7–10.3)
Chloride: 102 mmol/L (ref 96–106)
Creatinine, Ser: 1.23 mg/dL — ABNORMAL HIGH (ref 0.57–1.00)
Glucose: 115 mg/dL — ABNORMAL HIGH (ref 65–99)
Potassium: 4.2 mmol/L (ref 3.5–5.2)
Sodium: 141 mmol/L (ref 134–144)
eGFR: 46 mL/min/{1.73_m2} — ABNORMAL LOW (ref >59)

## 2020-10-27 LAB — BRAIN NATRIURETIC PEPTIDE: BNP: 83.6 pg/mL (ref 0.0–100.0)

## 2020-10-27 MED ORDER — FUROSEMIDE 20 MG PO TABS: 20.0000 mg | ORAL_TABLET | Freq: Every day | ORAL | 3 refills | Status: DC

## 2020-10-28 ENCOUNTER — Telehealth: Payer: Self-pay | Admitting: Cardiovascular Disease

## 2020-10-28 NOTE — Telephone Encounter (Signed)
Spoke with patient who states that per her scales she was lost a significant amount of weight. Patient states that today her weight was 183lb. Patient's weight was 199lb at [place] Wednesday with Dr. Loletha Grayer. Patient states she has been urinating frequently but does not report that she feels like she has lost that much weight.   Advised patient to continue to weigh her self daily at the same time every day and to write those numbers down so that we can appropriately trend her weights.   Patient states that she is also having trouble cutting her lasix tablet in half to = the new reduced dose of 20 mg daily. Patient requesting a new prescription be sent in for 20mg  tablets. Advised patient that the new prescription was sent in yesterday to her pharmacy, advised patient that the receipt was confirmed, and advised her to check with them and call back with any issues.   Advised patient that I would forward message to Dr. Loletha Grayer. Patient verbalized understanding.

## 2020-10-28 NOTE — Telephone Encounter (Signed)
Patient states she lost a tremendous amount of weight over the past few weeks and she is concerned. Please return call to discuss:  10/07/20: 212 lbs  10/14/20: 203 lbs 10/26/20: 199 lbs 10/28/20: 183 lbs

## 2020-10-28 NOTE — Telephone Encounter (Signed)
I suspect that big one day drop from 199 to 183 is not real, but probably related to the different scales. Let's stick with the 20 mg daily and will monitor trend with her home scale.

## 2020-10-31 ENCOUNTER — Telehealth: Payer: Self-pay | Admitting: Cardiovascular Disease

## 2020-10-31 ENCOUNTER — Other Ambulatory Visit: Payer: Self-pay

## 2020-10-31 ENCOUNTER — Telehealth: Payer: Self-pay

## 2020-10-31 MED ORDER — POTASSIUM CHLORIDE CRYS ER 20 MEQ PO TBCR: 20.0000 meq | EXTENDED_RELEASE_TABLET | Freq: Two times a day (BID) | ORAL | 0 refills | Status: DC

## 2020-10-31 NOTE — Telephone Encounter (Signed)
Please refill the KCl prescription.

## 2020-10-31 NOTE — Telephone Encounter (Signed)
Called patient back- she states that her weight over the weekend was:  Saturday- 180 lb Sunday- 182 lb Today (Monday)- 186 lb  Patient states that she did not eat any changes in food this weekend, but she has noticed some swelling in her knee.  Patient states that she also needs her potassium pill refilled- is this okay to send in?   Thanks!  Denies chest pains, or shortness of breath.

## 2020-10-31 NOTE — Telephone Encounter (Signed)
See other epic encounter.

## 2020-10-31 NOTE — Telephone Encounter (Signed)
She weighed 190 on our office scale and we cut back her diuretic then. Allowing for expected difference in the scales, I would say her optimal "dry weight" is 185-190 lb on her home scale.

## 2020-10-31 NOTE — Telephone Encounter (Signed)
Called patient, advised of message from MD.  Patient verbalized understanding, thankful for call back.  Sent in RX.

## 2020-10-31 NOTE — Chronic Care Management (AMB) (Addendum)
Chronic Care Management Pharmacy Assistant   Name: [name]  MRN: [id number] DOB: 1945/09/16  Reason for Encounter: Disease State     Conditions to be addressed/monitored: DMII  Recent office visits:  10/14/2020 Dr.Amy [facility] follow up, labs ordered 08/16/2020 Dr.Amy Bedsole PCP   08/04/2020 Jeannie Done PCP      Recent consult visits:  10/26/2020 Dr.Mihai Croitoru, Cardiology - Reduce furosemide to 20 mg daily. I think her "dry weight" (optimal fluid weight) is probably 200-205 lb. Please weigh every morning. Call us if weight is >205 lb. Continue to follow a low sodium diet. 10/24/2020  Max Hyatt DPM, Podiatry 10/17/2020 [place] 10/12/2020 Dr.Khoi Than, [place] 10/10/2020 [facility] DPM, Podiatry 10/07/2020 Scherrie Bateman Kim,DO  10/04/2020 Dr.Matthew Burney Gauze , Orthopedics   09/26/2020 Max Hyatt  DPM, Podiatry 08/08/2020 Salvatore Marvel , Allergy & Immunology  07/19/2020   Dr.Dwight Redmond Baseman, Otolaryngology 07/13/2020 Salvatore Marvel  07/05/2020 [place] [place] [facility] visits:  Medication Reconciliation was completed by comparing discharge summary, patient's EMR and Pharmacy list, and upon discussion with patient. Admitted to the hospital on 10/10/2020 due to Shortness of Breath  Discharge date was  10/11/2020 Discharged from [facility] Emergency Dept.   New?Medications Started at [facility] Discharge:?? -started Furosemide 40 mg -1 tablet daily and potassium chloride ER 20 mEq - 2 tablet daily    Medication Changes at Hospital Discharge: -none identified  Medications Discontinued at Hospital Discharge: -none identified  Medications that remain the same after Hospital Discharge:??  -All other medications will remain the same.    Medications: Outpatient Encounter Medications as of 10/31/2020  Medication Sig Note   acetaminophen (TYLENOL) 650 MG CR tablet Take 1,300 mg by mouth daily as needed for pain. 01/06/2020: Pt doesn't take both the  tylenol and the tylenol 3 together   Acetaminophen-Codeine 300-30 MG tablet     ADVAIR HFA 230-21 MCG/ACT inhaler INHALE 2 PUFFS BY MOUTH TWICE DAILY TO PREVENT COUGH OR WHEEZING, RINSE MOUTH AFTER USE    albuterol (VENTOLIN HFA) 108 (90 Base) MCG/ACT inhaler INHALE 2 PUFFS BY MOUTH INTO THE LUNGS EVERY 6 HOURS AS NEEDED FOR WHEEZING OR SHORTNESS OF BREATH    antiseptic oral rinse (BIOTENE) LIQD 15 mLs by Mouth Rinse route in the morning and at bedtime.    Apoaequorin (PREVAGEN) 10 MG CAPS Take 10 mg by mouth daily.    Artificial Saliva (BIOTENE DRY MOUTH) LOZG Use as directed 1 lozenge in the mouth or throat daily as needed (dry eyes).    atorvastatin (LIPITOR) 10 MG tablet TAKE 1 TABLET(10 MG) BY MOUTH DAILY    Blood Glucose Monitoring Suppl (ONETOUCH VERIO) w/Device KIT Use to check blood sugar up to 2 times a day    Capsaicin-Menthol (SALONPAS GEL EX) Apply 1 application topically daily as needed (pain).    Coenzyme Q10 (COQ-10) 100 MG CAPS Take 100 mg by mouth daily.    cyclobenzaprine (FLEXERIL) 10 MG tablet TAKE 1 TABLET(10 MG) BY MOUTH AT BEDTIME AS NEEDED FOR MUSCLE SPASMS    dextromethorphan-guaiFENesin (MUCINEX DM) 30-600 MG 12hr tablet Take 1 tablet by mouth 2 (two) times daily as needed for cough.    diazepam (VALIUM) 10 MG tablet     diclofenac Sodium (VOLTAREN) 1 % GEL Apply 2 g topically 4 (four) times daily as needed.    DULoxetine (CYMBALTA) 30 MG capsule     EPINEPHrine 0.3 mg/0.3 mL IJ SOAJ injection USE AS DIRECTED FOR LIFE THREATENING  ALLERGIC REACTIONS    famotidine (PEPCID) 40 MG tablet TAKE 1 TABLET(40 MG) BY MOUTH DAILY    Ferrous Sulfate (IRON) 325 (65 Fe) MG TABS Take 1 tablet by mouth daily.    fluticasone (FLOVENT HFA) 220 MCG/ACT inhaler INHALE 2 PUFFS BY MOUTH TWICE DAILY DURING FLARE UP AS NEEDED. RINSE, GARGLE AND SPIT AFTER USE    furosemide (LASIX) 20 MG tablet Take 1 tablet (20 mg total) by mouth daily.    gabapentin (NEURONTIN) 600 MG tablet TAKE 1 TABLET BY  MOUTH DAILY AT BREAKFAST, 1 TABLET AT LUNCH, AND 2 TABLETS AT BEDTIME    Garlic 094 MG TBEC Take 1 tablet by mouth daily.    GLYXAMBI 10-5 MG TABS TAKE 1 TABLET BY MOUTH DAILY    Homeopathic Products (THERAWORX RELIEF EX) Apply 1 spray topically daily as needed (muscle cramps).    hydrOXYzine (ATARAX/VISTARIL) 10 MG tablet Take 1 tablet (10 mg total) by mouth daily as needed for itching.    ipratropium (ATROVENT) 0.06 % nasal spray Place 2 sprays into both nostrils 2 (two) times daily as needed for rhinitis.    irbesartan (AVAPRO) 150 MG tablet TAKE 1 TABLET(150 MG) BY MOUTH DAILY    Lactase (DAIRY-RELIEF PO) Take 1 capsule by mouth daily as needed (eating dairy).     Lancets (ONETOUCH DELICA PLUS [id number]) MISC USE TO CHECK BLOOD SUGAR UP TO TWICE DAILY    levalbuterol (XOPENEX) 1.25 MG/3ML nebulizer solution USE 1 VIAL VIA NEBULIZER EVERY 6 HOURS AS NEEDED FOR WHEEZING OR SHORTNESS OF BREATH    meclizine (ANTIVERT) 12.5 MG tablet Take 1 tablet (12.5 mg total) by mouth 3 (three) times daily as needed for dizziness.    methimazole (TAPAZOLE) 5 MG tablet Take 0.5 tablets (2.5 mg total) by mouth daily.    Misc Natural Products (GLUCOSAMINE CHONDROITIN TRIPLE) TABS Take 2 tablets by mouth daily.    montelukast (SINGULAIR) 10 MG tablet TAKE 1 TABLET(10 MG) BY MOUTH DAILY    mupirocin cream (BACTROBAN) 2 % Apply 1 application topically 2 (two) times daily.    omalizumab (XOLAIR) 150 MG injection Inject 300 mg into the skin every 28 (twenty-eight) days.    ondansetron (ZOFRAN-ODT) 4 MG disintegrating tablet Take 4 mg by mouth every 8 (eight) hours as needed for nausea or vomiting.     ONETOUCH VERIO test strip USE TO CHECK BLOOD SUGAR UP TO TWICE DAILY    oxybutynin (DITROPAN-XL) 5 MG 24 hr tablet Take 5 mg by mouth daily.    pantoprazole (PROTONIX) 40 MG tablet TAKE 1 TABLET(40 MG) BY MOUTH DAILY    PATADAY 0.2 % SOLN Place 1 drop into both eyes daily as needed (allergies).     potassium chloride SA  (KLOR-CON) 20 MEQ tablet Take 1 tablet (20 mEq total) by mouth 2 (two) times daily.    Tiotropium Bromide Monohydrate (SPIRIVA RESPIMAT) 1.25 MCG/ACT AERS Inhale 2 puffs into the lungs daily.    tolterodine (DETROL) 2 MG tablet Take 2 mg by mouth daily.    triamcinolone (KENALOG) 0.1 % Apply 1 application topically 2 (two) times daily as needed.    TRULANCE 3 MG TABS Take 3 mg by mouth daily.     TURMERIC PO Take 1,000 mg by mouth daily.    XARELTO 20 MG TABS tablet TAKE 1 TABLET(20 MG) BY MOUTH DAILY WITH SUPPER    Facility-Administered Encounter Medications as of 10/31/2020  Medication   omalizumab Arvid Right) injection 300 mg   omalizumab Arvid Right) prefilled syringe  300 mg    Recent Relevant Labs: Lab Results  Component Value Date/Time   HGBA1C 6.2 04/20/2020 11:31 AM   HGBA1C 6.4 04/09/2019 01:32 PM   HGBA1C 6.2 08/14/2016 12:00 AM   HGBA1C 6.1 01/27/2016 12:00 AM    Kidney Function Lab Results  Component Value Date/Time   CREATININE 1.23 (H) 10/26/2020 10:28 AM   CREATININE 1.09 (H) 10/14/2020 03:21 PM   CREATININE 0.95 10/10/2020 07:00 PM   CREATININE 1.04 (H) 01/04/2020 10:59 AM   GFR 62.27 04/20/2020 11:31 AM   GFRNONAA >60 10/10/2020 07:00 PM   GFRNONAA 53 (L) 01/04/2020 10:59 AM   GFRAA 62 01/04/2020 10:59 AM   Diabetes   Current antihyperglycemic regimen: Empagliflozin-linagliptin (Glyxambi) 10 mg-5 mg - 1 tablet daily in the morning   Patient verbally confirms she is taking the above medications as directed. Yes  What recent interventions/DTPs have been made to improve glycemic control:  None identified  Have there been any recent hospitalizations or ED visits since last visit with CPP? Yes   10/10/2020 - 10/11/2020   SOB  Patient denies hypoglycemic symptoms, including Pale, Sweaty, Shaky, Hungry, Nervous/irritable and Vision changes  Patient denies hyperglycemic symptoms, including blurry vision, excessive thirst, fatigue, polyuria and weakness  How often are  you checking your blood sugar? once daily  What are your blood sugars ranging?  Fasting: 10/30/2020 120 Before meals: n/a After meals: n/a Bedtime: n/a  On insulin? No  During the week, how often does your blood glucose drop below 70? Never  Are you checking your feet daily/regularly? Yes  Adherence Review: Is the patient currently on a STATIN medication? Yes Is the patient currently on ACE/ARB medication? No Does the patient have >5 day gap between last estimated fill dates? No gaps in adherence  Star Rating Drugs:  Medication:  Last Fill: Day Supply Atorvastatin 65m. 10/17/2020 90ds Glyxambi 10-5 mg. 09/19/2020 30ds   Follow-Up:  Pharmacist Review  MDebbora Dus CPP notified  VAvel SensorCNew Century Spine And Outpatient Surgical InstituteClinical Pharmacy Assistant [phone] I have reviewed the care management and care coordination activities outlined in this encounter and I am certifying that I agree with the content of this note. No further action required.  MDebbora Dus PharmD Clinical Pharmacist LThe RockPrimary Care at [phone]

## 2020-10-31 NOTE — Telephone Encounter (Signed)
Patient was calling to speak with the nurse to let her know how her weight was over the weekend and also want to ask bout there prescription

## 2020-11-07 ENCOUNTER — Ambulatory Visit (HOSPITAL_COMMUNITY): Payer: Medicare Other | Attending: Cardiology

## 2020-11-07 ENCOUNTER — Other Ambulatory Visit: Payer: Self-pay

## 2020-11-07 DIAGNOSIS — J4540 Moderate persistent asthma, uncomplicated: Secondary | ICD-10-CM | POA: Diagnosis not present

## 2020-11-07 DIAGNOSIS — I5030 Unspecified diastolic (congestive) heart failure: Secondary | ICD-10-CM | POA: Insufficient documentation

## 2020-11-07 LAB — ECHOCARDIOGRAM COMPLETE
Area-P 1/2: 4.98 cm2
S' Lateral: 2.7 cm

## 2020-11-08 ENCOUNTER — Ambulatory Visit (INDEPENDENT_AMBULATORY_CARE_PROVIDER_SITE_OTHER): Payer: Medicare Other | Admitting: *Deleted

## 2020-11-08 DIAGNOSIS — J4540 Moderate persistent asthma, uncomplicated: Secondary | ICD-10-CM

## 2020-11-08 NOTE — Progress Notes (Signed)
I called her to discuss the echo findings. She is still in atrial flutter. I think I was fooled by her regular and slow rhythm when I saw her in clinic and she was probably in persistent atrial flutter with mostly 4:1 AV block all this time. The arrhythmia may have tipped her into fluid overload.She will probably benefit from cardioversion.  Heart pumping function is normal and there are no overt signs of fluid overload. Although atrial flutter limits the assessment of diastolic function, her mitral annulus velocities are so normal that I do not think she has substantial LV diastolic dysfunction.  Mild pulmonary hypertension and moderate TR, which I think is related to lung problems (OSA) rather than heart failure. She has not been using CPAP for a while, due to her other health issues. She also worries there is something wrong with her machine. She was planning to call Dr. Edwena Felty office to discuss that. Compliance with CPAP going forward will be critical to reduce the likelihood of arrhythmia recurrence. Her weight has been steady at 201-203 lb Will schedule for DC cardioversion with sedation on May 11.  She asked me to let Dr. Neldon Mc know about the sedation, since he has raised concerns regarding her asthma and anesthesia. I think the very brief sedation with IV propofol will not be a problem. Discussed the fact that we need uninterrupted anticoagulation with Xarelto for 3 weeks before and 1 month after the cardioversion.  She has an Ortho appt at [facility] on May 2 for her right knee arthritis. Pointed out to her that if surgery is planned, it should be scheduled no sooner than June 10. Based on the echo/ECG findings, will cancel the nuclear stress test scheduled on May 3. The cardioversion with sedation has been fully reviewed with the patient and verbal informed consent has been obtained.

## 2020-11-08 NOTE — Progress Notes (Signed)
We got a note from Dr Sallyanne Kuster about the patient being in slow atrial flutter. Quote:' Mild pulmonary hypertension and moderate TR, which I think is related to lung problems (OSA) rather than heart failure. She has not been using CPAP for a while, due to her other health issues. She also worries there is something wrong with her machine. She was planning to call Dr. Edwena Felty office to discuss that.  Compliance with CPAP going forward will be critical to reduce the likelihood of arrhythmia recurrence."   The patient had a CPAP titration in 8/ 2019, after beng diagnosed with mild sleep apnea, AHI 12.6/h and with oxygen desaturation. She responded to only 6 cm water pressure and her AHI was 0.6/h. a-flutter while in the sleep lab.   Last seen by NP M.Millikan on 04-13-2020- low compliance noted, 73% for days of use. AHI of 1.3/h with high air leak. Epworth was at 10 points.  Please schedule urgent compliance visit , offer mask refitting and address any comfort concerns -   PS : including BMI, Epworth, Mallampati changes and presence of ankle edema or tachypnoea.  May need to recheck with attended or home sleep study where her apnea now stands, or if she would prefer BiPAP for comfort, etc.

## 2020-11-08 NOTE — H&P (View-Only) (Signed)
I called her to discuss the echo findings. She is still in atrial flutter. I think I was fooled by her regular and slow rhythm when I saw her in clinic and she was probably in persistent atrial flutter with mostly 4:1 AV block all this time. The arrhythmia may have tipped her into fluid overload.She will probably benefit from cardioversion.  Heart pumping function is normal and there are no overt signs of fluid overload. Although atrial flutter limits the assessment of diastolic function, her mitral annulus velocities are so normal that I do not think she has substantial LV diastolic dysfunction.  Mild pulmonary hypertension and moderate TR, which I think is related to lung problems (OSA) rather than heart failure. She has not been using CPAP for a while, due to her other health issues. She also worries there is something wrong with her machine. She was planning to call Dr. Dohmeier's office to discuss that. Compliance with CPAP going forward will be critical to reduce the likelihood of arrhythmia recurrence. Her weight has been steady at 201-203 lb Will schedule for DC cardioversion with sedation on May 11.  She asked me to let Dr. Kozlow know about the sedation, since he has raised concerns regarding her asthma and anesthesia. I think the very brief sedation with IV propofol will not be a problem. Discussed the fact that we need uninterrupted anticoagulation with Xarelto for 3 weeks before and 1 month after the cardioversion.  She has an Ortho appt at [facility] on May 2 for her right knee arthritis. Pointed out to her that if surgery is planned, it should be scheduled no sooner than June 10. Based on the echo/ECG findings, will cancel the nuclear stress test scheduled on May 3. The cardioversion with sedation has been fully reviewed with the patient and verbal informed consent has been obtained.   

## 2020-11-10 ENCOUNTER — Telehealth: Payer: Self-pay | Admitting: Neurology

## 2020-11-10 ENCOUNTER — Telehealth: Payer: Self-pay | Admitting: *Deleted

## 2020-11-10 DIAGNOSIS — I10 Essential (primary) hypertension: Secondary | ICD-10-CM

## 2020-11-10 DIAGNOSIS — I480 Paroxysmal atrial fibrillation: Secondary | ICD-10-CM

## 2020-11-10 NOTE — Telephone Encounter (Signed)
I pulled up the patient's data and she has started to use her machine.  Patient states that there was 1 night where the machine had cut off and would not cut back on.  Patient states this has not happened since and so far she is doing well with using the machine.  Patient was advised that Dr. Brett Fairy wanted [place] to schedule a compliance visit and make sure her mask fits okay. Pt states she has no concerns with the machine or her mask and understands the importance of continuing to use the machine. I provided the patient with [facility] ([place]) phone number in case she did feel the mask would need changing.Given no concerns per pt, I scheduled the patient around 31 days of her using the machine so we can determine if the machine is working appropriately and compliance.  Pt was appreciative for the call to check in and will let us know if there are any issues.

## 2020-11-10 NOTE — Telephone Encounter (Signed)
Dr Dohmeier forwarded this to me:  We got a note from Dr Sallyanne Kuster about the patient being in slow atrial flutter. Quote:' Mild pulmonary hypertension and moderate TR, which I think is related to lung problems (OSA) rather than heart failure. She has not been using CPAP for a while, due to her other health issues. She also worries there is something wrong with her machine. She was planning to call Dr. Edwena Felty office to discuss that.  Compliance with CPAP going forward will be critical to reduce the likelihood of arrhythmia recurrence."    The patient had a CPAP titration in 8/ 2019, after beng diagnosed with mild sleep apnea, AHI 12.6/h and with oxygen desaturation. She responded to only 6 cm water pressure and her AHI was 0.6/h. a-flutter while in the sleep lab.   Last seen by NP M.Millikan on 04-13-2020- low compliance noted, 73% for days of use. AHI of 1.3/h with high air leak.  Epworth was at 10 points.   Please schedule urgent compliance visit , offer mask refitting and address any comfort concerns -   PS : including  BMI, Epworth, Mallampati changes and presence of ankle edema or tachypnoea.  May need to recheck with attended or home sleep study where her apnea now stands, or if she would prefer BiPAP for comfort, etc.

## 2020-11-10 NOTE — Telephone Encounter (Signed)
Left a message for the patient to call back to go over the cardioversion instructions.    You are scheduled for a Cardioversion on 11/23/20 with Dr. Sallyanne Kuster.  Please arrive at the [facility] (Main Entrance A) at [facility]: [address] [place], [place] [address] at 11 am. (1 hour prior to procedure unless lab work is needed)  DIET: Nothing to eat or drink after midnight except a sip of water with medications (see medication instructions below)  Medication Instructions: Hold Furosemide the morning of the procedure  Continue your anticoagulant: Xarelto.  You will need to continue your anticoagulant after your procedure until you  are told by your provider that it is safe to stop   Labs:  Your provider would like for you to return on Monday 11/21/20 to have the following labs drawn: CBC and BMET. You do not need an appointment for the lab. Once in our office lobby there is a podium where you can sign in and ring the doorbell to alert [place] that you are here. The lab is open from 8:00 am to 4:30 pm; closed for lunch from 12:45pm-1:45pm.  You will need to have the coronavirus test completed prior to your procedure. An appointment has been made at 11:20 am on 11/21/20. This is a Drive Up Visit at [address], [place],  [address]. Please tell them that you are there for procedure testing. Stay in your car and someone will be with you shortly. Please make sure to have all other labs completed before this test because you will need to stay quarantined until your procedure.   You must have a responsible person to drive you home and stay in the waiting area during your procedure. Failure to do so could result in cancellation.  Bring your insurance cards.  *Special Note: Every effort is made to have your procedure done on time. Occasionally there are emergencies that occur at the hospital that may cause delays. Please be patient if a delay does occur.

## 2020-11-11 ENCOUNTER — Encounter: Payer: Self-pay | Admitting: *Deleted

## 2020-11-11 NOTE — Telephone Encounter (Signed)
[name] is returning [name]'s call. Please advise.

## 2020-11-11 NOTE — Telephone Encounter (Signed)
Pt updated with cardioversion instructions and verbalized understanding.

## 2020-11-11 NOTE — Telephone Encounter (Signed)
Instructions have been mailed

## 2020-11-14 DIAGNOSIS — M1711 Unilateral primary osteoarthritis, right knee: Secondary | ICD-10-CM | POA: Diagnosis not present

## 2020-11-14 DIAGNOSIS — M25561 Pain in right knee: Secondary | ICD-10-CM | POA: Diagnosis not present

## 2020-11-14 DIAGNOSIS — G8929 Other chronic pain: Secondary | ICD-10-CM | POA: Diagnosis not present

## 2020-11-14 NOTE — Telephone Encounter (Signed)
Spoke with the patient in regards to cardioversion instructions and COVID testing prior. Patient's questions have been answered and patient verbalized understanding.

## 2020-11-14 NOTE — Telephone Encounter (Signed)
Brandy is calling requesting a callback in regards to this previous conversation. She states she has f/u questions concerning it. Please advise.

## 2020-11-15 ENCOUNTER — Encounter (HOSPITAL_COMMUNITY): Payer: Medicare Other

## 2020-11-17 ENCOUNTER — Telehealth: Payer: Self-pay | Admitting: Cardiovascular Disease

## 2020-11-17 NOTE — Telephone Encounter (Signed)
Spoke with pt, questions regarding upcoming cardioversion answered.

## 2020-11-17 NOTE — Telephone Encounter (Signed)
    Pt would like to speak with Dr. Loletha Grayer or his nurse. She said she still haven't receive the instructions for her upcoming procedure and she has some questions about it

## 2020-11-21 ENCOUNTER — Other Ambulatory Visit (HOSPITAL_COMMUNITY)
Admission: RE | Admit: 2020-11-21 | Discharge: 2020-11-21 | Disposition: A | Discharge status: Home / Self Care | Payer: Medicare Other | Source: Ambulatory Visit | Attending: Cardiovascular Disease | Admitting: Cardiovascular Disease

## 2020-11-21 ENCOUNTER — Other Ambulatory Visit: Payer: Self-pay | Admitting: *Deleted

## 2020-11-21 DIAGNOSIS — Z01812 Encounter for preprocedural laboratory examination: Secondary | ICD-10-CM | POA: Diagnosis not present

## 2020-11-21 DIAGNOSIS — I480 Paroxysmal atrial fibrillation: Secondary | ICD-10-CM | POA: Diagnosis not present

## 2020-11-21 DIAGNOSIS — Z20822 Contact with and (suspected) exposure to covid-19: Secondary | ICD-10-CM | POA: Insufficient documentation

## 2020-11-21 DIAGNOSIS — I10 Essential (primary) hypertension: Secondary | ICD-10-CM | POA: Diagnosis not present

## 2020-11-21 LAB — CBC
Hematocrit: 41.1 % (ref 34.0–46.6)
Hemoglobin: 13.4 g/dL (ref 11.1–15.9)
MCH: 25.7 pg — ABNORMAL LOW (ref 26.6–33.0)
MCHC: 32.6 g/dL (ref 31.5–35.7)
MCV: 79 fL (ref 79–97)
Platelets: 276 10*3/uL (ref 150–450)
RBC: 5.21 x10E6/uL (ref 3.77–5.28)
RDW: 15.9 % — ABNORMAL HIGH (ref 11.7–15.4)
WBC: 7.4 10*3/uL (ref 3.4–10.8)

## 2020-11-21 LAB — BASIC METABOLIC PANEL
BUN/Creatinine Ratio: 15 (ref 12–28)
BUN: 16 mg/dL (ref 8–27)
CO2: 22 mmol/L (ref 20–29)
Calcium: 9.2 mg/dL (ref 8.7–10.3)
Chloride: 105 mmol/L (ref 96–106)
Creatinine, Ser: 1.09 mg/dL — ABNORMAL HIGH (ref 0.57–1.00)
Glucose: 116 mg/dL — ABNORMAL HIGH (ref 65–99)
Potassium: 4.8 mmol/L (ref 3.5–5.2)
Sodium: 140 mmol/L (ref 134–144)
eGFR: 53 mL/min/{1.73_m2} — ABNORMAL LOW (ref >59)

## 2020-11-21 LAB — SARS CORONAVIRUS 2 (TAT 6-24 HRS): SARS Coronavirus 2: NEGATIVE

## 2020-11-22 NOTE — Telephone Encounter (Signed)
Patient never received her instructions that were mailed on 11/11/20. Instructions have been gone over and all questions have been answered.  Patient has been taking Xarelto daily and has not missed a dose.

## 2020-11-22 NOTE — Telephone Encounter (Signed)
Pt called, would like to discuss my CPAP machine usage.   Contact info: [phone]

## 2020-11-22 NOTE — Telephone Encounter (Signed)
Pt called in stated she was told she was going to get some type of paper before her surgery.  She stated her surgery is tomorrow and she has not rec'd anything.  She would like to speak with the nurse   Best  Number [phone]

## 2020-11-23 ENCOUNTER — Ambulatory Visit (HOSPITAL_COMMUNITY): Payer: Medicare Other | Admitting: Certified Registered"

## 2020-11-23 ENCOUNTER — Encounter (HOSPITAL_COMMUNITY): Payer: Self-pay | Admitting: Cardiovascular Disease

## 2020-11-23 ENCOUNTER — Encounter (HOSPITAL_COMMUNITY)
Admission: RE | Disposition: A | Discharge status: Home / Self Care | Payer: Self-pay | Source: Home / Self Care | Attending: Cardiovascular Disease

## 2020-11-23 ENCOUNTER — Other Ambulatory Visit: Payer: Self-pay

## 2020-11-23 ENCOUNTER — Ambulatory Visit (HOSPITAL_COMMUNITY)
Admission: RE | Admit: 2020-11-23 | Discharge: 2020-11-23 | Disposition: A | Discharge status: Home / Self Care | Payer: Medicare Other | Attending: Cardiovascular Disease | Admitting: Cardiovascular Disease

## 2020-11-23 DIAGNOSIS — Z9989 Dependence on other enabling machines and devices: Secondary | ICD-10-CM | POA: Diagnosis not present

## 2020-11-23 DIAGNOSIS — I480 Paroxysmal atrial fibrillation: Secondary | ICD-10-CM | POA: Diagnosis not present

## 2020-11-23 DIAGNOSIS — I4891 Unspecified atrial fibrillation: Secondary | ICD-10-CM | POA: Insufficient documentation

## 2020-11-23 DIAGNOSIS — I4819 Other persistent atrial fibrillation: Secondary | ICD-10-CM | POA: Diagnosis not present

## 2020-11-23 DIAGNOSIS — I4892 Unspecified atrial flutter: Secondary | ICD-10-CM | POA: Insufficient documentation

## 2020-11-23 DIAGNOSIS — I272 Pulmonary hypertension, unspecified: Secondary | ICD-10-CM | POA: Insufficient documentation

## 2020-11-23 DIAGNOSIS — K219 Gastro-esophageal reflux disease without esophagitis: Secondary | ICD-10-CM | POA: Diagnosis not present

## 2020-11-23 DIAGNOSIS — G4733 Obstructive sleep apnea (adult) (pediatric): Secondary | ICD-10-CM | POA: Diagnosis not present

## 2020-11-23 HISTORY — PX: CARDIOVERSION: SHX1299

## 2020-11-23 SURGERY — CARDIOVERSION
Anesthesia: General

## 2020-11-23 MED ORDER — SODIUM CHLORIDE 0.9 % IV SOLN: INTRAVENOUS | Status: DC | PRN

## 2020-11-23 MED ORDER — LIDOCAINE 2% (20 MG/ML) 5 ML SYRINGE
INTRAMUSCULAR | Status: DC | PRN
  Administered 2020-11-23: 60 mg via INTRAVENOUS

## 2020-11-23 MED ORDER — SODIUM CHLORIDE 0.9 % IV SOLN: Freq: Once | INTRAVENOUS | Status: AC

## 2020-11-23 MED ORDER — PROPOFOL 10 MG/ML IV BOLUS
INTRAVENOUS | Status: DC | PRN
  Administered 2020-11-23: 60 mg via INTRAVENOUS

## 2020-11-23 MED ORDER — RIVAROXABAN 20 MG PO TABS
20.0000 mg | ORAL_TABLET | Freq: Once | ORAL | Status: AC
  Administered 2020-11-23: 20 mg via ORAL
  Filled 2020-11-23: qty 1

## 2020-11-23 NOTE — Anesthesia Postprocedure Evaluation (Signed)
Anesthesia Post Note  Patient: [name]  Procedure(s) Performed: CARDIOVERSION (N/A )     Patient location during evaluation: PACU Anesthesia Type: General Level of consciousness: awake and alert Pain management: pain level controlled Vital Signs Assessment: post-procedure vital signs reviewed and stable Respiratory status: spontaneous breathing, nonlabored ventilation, respiratory function stable and patient connected to nasal cannula oxygen Cardiovascular status: blood pressure returned to baseline and stable Postop Assessment: no apparent nausea or vomiting Anesthetic complications: no   No complications documented.  Last Vitals:  Vitals:   11/23/20 1221 11/23/20 1227  BP: 136/84 120/75  Pulse: 97 73  Resp: 16 11  Temp:    SpO2: 99% 100%    Last Pain:  Vitals:   11/23/20 1221  TempSrc:   PainSc: 0-No pain                 Najib Colmenares

## 2020-11-23 NOTE — Transfer of Care (Signed)
Immediate Anesthesia Transfer of Care Note  Patient: [name]  Procedure(s) Performed: CARDIOVERSION (N/A )  Patient Location: Endoscopy Unit  Anesthesia Type:General  Level of Consciousness: awake, alert  and oriented  Airway & Oxygen Therapy: Patient Spontanous Breathing  Post-op Assessment: Report given to RN  Post vital signs: Reviewed and stable  Last Vitals:  Vitals Value Taken Time  BP 125/65 11/23/20 1157  Temp    Pulse 91 11/23/20 1159  Resp 17 11/23/20 1159  SpO2 63 % 11/23/20 1159    Last Pain:  Vitals:   11/23/20 1108  TempSrc: Oral  PainSc: 0-No pain         Complications: No complications documented.

## 2020-11-23 NOTE — Interval H&P Note (Signed)
History and Physical Interval Note:  11/23/2020 11:34 AM  [place]  has presented today for surgery, with the diagnosis of afib.  The various methods of treatment have been discussed with the patient and family. After consideration of risks, benefits and other options for treatment, the patient has consented to  Procedure(s): CARDIOVERSION (N/A) as a surgical intervention.  The patient's history has been reviewed, patient examined, no change in status, stable for surgery.  I have reviewed the patient's chart and labs.  Questions were answered to the patient's satisfaction.     Shaelin Lalley

## 2020-11-23 NOTE — Op Note (Signed)
Procedure: Electrical Cardioversion Indications:  Atrial Fibrillation  Procedure Details:  Consent: Risks of procedure as well as the alternatives and risks of each were explained to the (patient/caregiver).  Consent for procedure obtained.  Time Out: Verified patient identification, verified procedure, site/side was marked, verified correct patient position, special equipment/implants available, medications/allergies/relevent history reviewed, required imaging and test results available.  Performed  Patient placed on cardiac monitor, pulse oximetry, supplemental oxygen as necessary.  Sedation given: propofol 60 mg IV, Dr. Candis Shine Pacer pads placed anterior and posterior chest.  Cardioverted 1 time(s).  Cardioversion with synchronized biphasic 120J shock.  Evaluation: Findings: Post procedure EKG shows: NSR Complications: None Patient did tolerate procedure well.  Time Spent Directly with the Patient:  30 minutes   Quinnlan Abruzzo 11/23/2020, 12:01 PM

## 2020-11-23 NOTE — Assessment & Plan Note (Signed)
Likely multi factorial  Continue current dose of lasix and potassium until we call with labs. Keep appt with Cardiology as planned for ECHO.

## 2020-11-23 NOTE — Discharge Instructions (Signed)
Electrical Cardioversion Electrical cardioversion is the delivery of a jolt of electricity to restore a normal rhythm to the heart. A rhythm that is too fast or is not regular keeps the heart from pumping well. In this procedure, sticky patches or metal paddles are placed on the chest to deliver electricity to the heart from a device. This procedure may be done in an emergency if:  There is low or no blood pressure as a result of the heart rhythm.  Normal rhythm must be restored as fast as possible to protect the brain and heart from further damage.  It may save a life. This may also be a scheduled procedure for irregular or fast heart rhythms that are not immediately life-threatening. Tell a health care provider about:  Any allergies you have.  All medicines you are taking, including vitamins, herbs, eye drops, creams, and over-the-counter medicines.  Any problems you or family members have had with anesthetic medicines.  Any blood disorders you have.  Any surgeries you have had.  Any medical conditions you have.  Whether you are pregnant or may be pregnant. What are the risks? Generally, this is a safe procedure. However, problems may occur, including:  Allergic reactions to medicines.  A blood clot that breaks free and travels to other parts of your body.  The possible return of an abnormal heart rhythm within hours or days after the procedure.  Your heart stopping (cardiac arrest). This is rare. What happens before the procedure? Medicines  Your health care provider may have you start taking: ? Blood-thinning medicines (anticoagulants) so your blood does not clot as easily. ? Medicines to help stabilize your heart rate and rhythm.  Ask your health care provider about: ? Changing or stopping your regular medicines. This is especially important if you are taking diabetes medicines or blood thinners. ? Taking medicines such as aspirin and ibuprofen. These medicines can  thin your blood. Do not take these medicines unless your health care provider tells you to take them. ? Taking over-the-counter medicines, vitamins, herbs, and supplements. General instructions  Follow instructions from your health care provider about eating or drinking restrictions.  Plan to have someone take you home from the hospital or clinic.  If you will be going home right after the procedure, plan to have someone with you for 24 hours.  Ask your health care provider what steps will be taken to help prevent infection. These may include washing your skin with a germ-killing soap. What happens during the procedure?  An IV will be inserted into one of your veins.  Sticky patches (electrodes) or metal paddles may be placed on your chest.  You will be given a medicine to help you relax (sedative).  An electrical shock will be delivered. The procedure may vary among health care providers and hospitals.   What can I expect after the procedure?  Your blood pressure, heart rate, breathing rate, and blood oxygen level will be monitored until you leave the hospital or clinic.  Your heart rhythm will be watched to make sure it does not change.  You may have some redness on the skin where the shocks were given. Follow these instructions at home:  Do not drive for 24 hours if you were given a sedative during your procedure.  Take over-the-counter and prescription medicines only as told by your health care provider.  Ask your health care provider how to check your pulse. Check it often.  Rest for 48 hours after the procedure   or as told by your health care provider.  Avoid or limit your caffeine use as told by your health care provider.  Keep all follow-up visits as told by your health care provider. This is important. Contact a health care provider if:  You feel like your heart is beating too quickly or your pulse is not regular.  You have a serious muscle cramp that does not go  away. Get help right away if:  You have discomfort in your chest.  You are dizzy or you feel faint.  You have trouble breathing or you are short of breath.  Your speech is slurred.  You have trouble moving an arm or leg on one side of your body.  Your fingers or toes turn cold or blue. Summary  Electrical cardioversion is the delivery of a jolt of electricity to restore a normal rhythm to the heart.  This procedure may be done right away in an emergency or may be a scheduled procedure if the condition is not an emergency.  Generally, this is a safe procedure.  After the procedure, check your pulse often as told by your health care provider. This information is not intended to replace advice given to you by your health care provider. Make sure you discuss any questions you have with your health care provider. Document Revised: 02/02/2019 Document Reviewed: 02/02/2019 Elsevier Patient Education  2021 [employer]  

## 2020-11-23 NOTE — Anesthesia Preprocedure Evaluation (Signed)
Anesthesia Evaluation  Patient identified by MRN, date of birth, ID band Patient awake    Reviewed: Allergy & Precautions, H&P , NPO status , Patient's Chart, lab work & pertinent test results, reviewed documented beta blocker date and time   Airway Mallampati: II  TM Distance: >3 FB Neck ROM: full    Dental no notable dental hx.    Pulmonary sleep apnea and Continuous Positive Airway Pressure Ventilation ,    Pulmonary exam normal breath sounds clear to auscultation       Cardiovascular Exercise Tolerance: Good hypertension, Pt. on medications + DVT  + dysrhythmias Atrial Fibrillation  Rhythm:regular Rate:Normal  ECHO 4/22 1. Rhythm appears to be atrial flutter with variable conduction. Left  ventricular ejection fraction, by estimation, is 60 to 65%. The left  ventricle has normal function. The left ventricle has no regional wall  motion abnormalities. There is moderate  asymmetric left ventricular hypertrophy of the basal-septal segment. Left  ventricular diastolic parameters are indeterminate.  2. Right ventricular systolic function is normal. The right ventricular  size is normal. There is mildly elevated pulmonary artery systolic  pressure. The estimated right ventricular systolic pressure is 70.2 mmHg.  3. Left atrial size was mildly dilated.  4. Right atrial size was moderately dilated.  5. The mitral valve is normal in structure. Trivial mitral valve  regurgitation. No evidence of mitral stenosis.  6. Tricuspid valve regurgitation is moderate.  7. The aortic valve is tricuspid. Aortic valve regurgitation is not  visualized. No aortic stenosis is present.    Neuro/Psych negative neurological ROS  negative psych ROS   GI/Hepatic negative GI ROS, Neg liver ROS,   Endo/Other  negative endocrine ROSdiabetes  Renal/GU negative Renal ROS  negative genitourinary   Musculoskeletal   Abdominal   Peds   Hematology negative hematology ROS (+)   Anesthesia Other Findings   Reproductive/Obstetrics negative OB ROS                             Anesthesia Physical Anesthesia Plan  ASA: III  Anesthesia Plan: General   Post-op Pain Management:    Induction: Intravenous  PONV Risk Score and Plan: 3  Airway Management Planned: Nasal Cannula, Natural Airway and Simple Face Mask  Additional Equipment: None  Intra-op Plan:   Post-operative Plan:   Informed Consent: I have reviewed the patients History and Physical, chart, labs and discussed the procedure including the risks, benefits and alternatives for the proposed anesthesia with the patient or authorized representative who has indicated his/her understanding and acceptance.     Dental Advisory Given  Plan Discussed with: CRNA  Anesthesia Plan Comments: ( )        Anesthesia Quick Evaluation

## 2020-11-23 NOTE — Telephone Encounter (Signed)
Called the patient back.  Patient states that she has been attempting to use her machine more frequently but she has had a few health issues that have hindered her from using the machine. She has an upcoming apointment on 12/19/20. Encouraged the pt to call back closer to the apt that we can look at the data and see if she meets the compliance goal. Pt verbalized understanding. Pt had no questions at this time but was encouraged to call back if questions arise.

## 2020-11-24 ENCOUNTER — Telehealth: Payer: Self-pay | Admitting: Cardiovascular Disease

## 2020-11-24 ENCOUNTER — Encounter (HOSPITAL_COMMUNITY): Payer: Self-pay | Admitting: Cardiovascular Disease

## 2020-11-24 ENCOUNTER — Telehealth: Payer: Self-pay

## 2020-11-24 NOTE — Telephone Encounter (Signed)
Pt said about 2 wks ago she noticed a knot between size of nickel and quarter on rt lower leg; today there is a bruised area below where the knot is on rt lower leg that is sore to touch. Pt said leg does not look red and does not feel warm to touch. Pt said there might be a little redness at knot; pt had cardioversion on 11/23/20; pt said the asthma doctor Dr Neldon Mc saw lower leg couple of weeks ago and told pt "could not be a clot". Pt has hx of blood clot. Melanie CMA started talking with pt prior to transferring to triage and she spoke with Dr Diona Browner while I was on phone with pt; Threasa Beards CMA said Dr Diona Browner pt can wait and see her 11/25/20 at 2:20. UC & ED precautions given and pt voiced understanding.

## 2020-11-24 NOTE — Telephone Encounter (Signed)
New Message:     Pt said she had a procedure yesterday.and wanted to tlk to Dr Sallyanne Kuster. Pt was made aware that Dr C was not n the office today. Pt said she would like to talk to him when he was in the office again.

## 2020-11-24 NOTE — Telephone Encounter (Signed)
Noted  

## 2020-11-24 NOTE — Telephone Encounter (Signed)
This RN called patient at [phone], this RN asked patient if there was anything that could be done for the patient while Dr. Sallyanne Kuster is out of the office, patient said no, she only wants to talk to Dr. Sallyanne Kuster. This RN let patient know her message would be sent to Dr. Sallyanne Kuster.

## 2020-11-25 ENCOUNTER — Encounter: Payer: Self-pay | Admitting: Family Medicine

## 2020-11-25 ENCOUNTER — Other Ambulatory Visit: Payer: Self-pay

## 2020-11-25 ENCOUNTER — Ambulatory Visit (INDEPENDENT_AMBULATORY_CARE_PROVIDER_SITE_OTHER): Payer: Medicare Other | Admitting: Family Medicine

## 2020-11-25 ENCOUNTER — Other Ambulatory Visit: Payer: Self-pay | Admitting: Allergy and Immunology

## 2020-11-25 VITALS — BP 124/70 | HR 62 | Temp 98.1°F | Ht 63.5 in | Wt 205.0 lb

## 2020-11-25 DIAGNOSIS — S8011XA Contusion of right lower leg, initial encounter: Secondary | ICD-10-CM | POA: Diagnosis not present

## 2020-11-25 DIAGNOSIS — I82501 Chronic embolism and thrombosis of unspecified deep veins of right lower extremity: Secondary | ICD-10-CM | POA: Diagnosis not present

## 2020-11-25 NOTE — Assessment & Plan Note (Signed)
No clear sign of DVT.Marland Kitchen no calf pain... has contusion and possible small hematoma anteriorly.  On Xarelto.   Encouraged ice, elevation and tylenol for pain.

## 2020-11-25 NOTE — Telephone Encounter (Signed)
Called and spoke to the patient. She stated that she wants to speak with Dr. Sallyanne Kuster about the cardioversion in detail. She would rather talk to him now and not wait until the appointment date. She would not disclose in detail what she would like to speak about.  She has been advised that Dr. Sallyanne Kuster is in clinic.

## 2020-11-25 NOTE — Patient Instructions (Signed)
Elevate right leg above heart.  Apply ice off and on.  Use tylneol extra strength twice daily for pain.

## 2020-11-25 NOTE — Progress Notes (Signed)
Patient ID: [name], female    DOB: 28-Nov-1945, 75 y.o.   MRN: [id number]  This visit was conducted in person.  BP 124/70   Pulse 62   Temp 98.1 F (36.7 C) (Temporal)   Ht 5' 3.5" (1.613 m)   Wt 205 lb (93 kg)   SpO2 97%   BMI 35.74 kg/m    CC:  Chief Complaint  Patient presents with  . Knot on Right Leg  . Leg Swelling    Right  . Leg Pain    Right    Subjective:   HPI: [name] is a 75 y.o. female presenting on 11/25/2020 for Knot on Right Leg, Leg Swelling (Right), and Leg Pain (Right)   Hx of Atrial fibrillation on Xareto.. had cardioversion 11/23/2020  Hx of DVT  She noted knot in anterior right lower leg 1.5 week.  She has noted bruising below knot  As well as soreness.  She has also noted some swelling in right lower leg in last 24 hours. No calf pain.   No new change in breathing or new chest pain.  She does not remember any leg injury.  She forgot fluid pill laet night.. took this morning...  Causes he to urinate more.  BP Readings from Last 3 Encounters:  11/25/20 124/70  11/23/20 120/75  10/26/20 (!) 90/54     Has not taken anything.      Relevant past medical, surgical, family and social history reviewed and updated as indicated. Interim medical history since our last visit reviewed. Allergies and medications reviewed and updated. Outpatient Medications Prior to Visit  Medication Sig Dispense Refill  . acetaminophen (TYLENOL) 650 MG CR tablet Take 1,300 mg by mouth daily as needed for pain.    . Acetaminophen-Codeine 300-30 MG tablet Take 1 tablet by mouth every 6 (six) hours as needed for pain.    Marland Kitchen ADVAIR HFA 230-21 MCG/ACT inhaler INHALE 2 PUFFS BY MOUTH TWICE DAILY TO PREVENT COUGH OR WHEEZING, RINSE MOUTH AFTER USE 12 g 4  . albuterol (VENTOLIN HFA) 108 (90 Base) MCG/ACT inhaler INHALE 2 PUFFS BY MOUTH INTO THE LUNGS EVERY 6 HOURS AS NEEDED FOR WHEEZING OR SHORTNESS OF BREATH 54 g 0  . atorvastatin (LIPITOR) 10 MG  tablet TAKE 1 TABLET(10 MG) BY MOUTH DAILY 90 tablet 1  . Blood Glucose Monitoring Suppl (ONETOUCH VERIO) w/Device KIT Use to check blood sugar up to 2 times a day 1 kit 0  . Coenzyme Q10 (COQ-10) 100 MG CAPS Take 100 mg by mouth daily.    . cyclobenzaprine (FLEXERIL) 10 MG tablet TAKE 1 TABLET(10 MG) BY MOUTH AT BEDTIME AS NEEDED FOR MUSCLE SPASMS 30 tablet 1  . dextromethorphan-guaiFENesin (MUCINEX DM) 30-600 MG 12hr tablet Take 1 tablet by mouth 2 (two) times daily.    Marland Kitchen DM-APAP-CPM (CORICIDIN HBP PO) Take 1 tablet by mouth daily as needed (cough/cold).    . EPINEPHrine 0.3 mg/0.3 mL IJ SOAJ injection USE AS DIRECTED FOR LIFE THREATENING ALLERGIC REACTIONS 2 each 2  . famotidine (PEPCID) 40 MG tablet TAKE 1 TABLET(40 MG) BY MOUTH DAILY 30 tablet 3  . Ferrous Sulfate (IRON) 325 (65 Fe) MG TABS Take 325 mg by mouth daily.    . fluticasone (FLOVENT HFA) 220 MCG/ACT inhaler INHALE 2 PUFFS BY MOUTH TWICE DAILY DURING FLARE UP AS NEEDED. RINSE, GARGLE AND SPIT AFTER USE 12 g 1  . furosemide (LASIX) 20 MG tablet Take 1 tablet (20 mg  total) by mouth daily. 90 tablet 3  . gabapentin (NEURONTIN) 600 MG tablet TAKE 1 TABLET BY MOUTH DAILY AT BREAKFAST, 1 TABLET AT LUNCH, AND 2 TABLETS AT BEDTIME 360 tablet 1  . GARLIC PO Take 1 tablet by mouth daily.    Marland Kitchen GLYXAMBI 10-5 MG TABS TAKE 1 TABLET BY MOUTH DAILY 30 tablet 11  . Homeopathic Products [facility] RELIEF EX) Apply 1 spray topically daily as needed (muscle cramps).    . hydrOXYzine (ATARAX/VISTARIL) 10 MG tablet Take 1 tablet (10 mg total) by mouth daily as needed for itching. 15 tablet 0  . ipratropium (ATROVENT) 0.06 % nasal spray Place 2 sprays into both nostrils 2 (two) times daily as needed for rhinitis.    Marland Kitchen irbesartan (AVAPRO) 150 MG tablet TAKE 1 TABLET(150 MG) BY MOUTH DAILY 30 tablet 8  . Lactase (DAIRY-RELIEF PO) Take 1 capsule by mouth daily as needed (eating dairy).     . Lancets (ONETOUCH DELICA PLUS [id number]) MISC USE TO CHECK BLOOD  SUGAR UP TO TWICE DAILY 100 each 5  . levalbuterol (XOPENEX) 1.25 MG/3ML nebulizer solution USE 1 VIAL VIA NEBULIZER EVERY 6 HOURS AS NEEDED FOR WHEEZING OR SHORTNESS OF BREATH 9 mL 1  . liver oil-zinc oxide (DESITIN) 40 % ointment Apply 1 application topically as needed for irritation.    . meclizine (ANTIVERT) 12.5 MG tablet Take 1 tablet (12.5 mg total) by mouth 3 (three) times daily as needed for dizziness. 15 tablet 0  . Menthol-Methyl Salicylate (SALONPAS JET SPRAY EX) Apply 1 spray topically daily as needed (knee pain).    . methimazole (TAPAZOLE) 5 MG tablet Take 2.5 mg by mouth every other day.    . Misc Natural Products (NEURIVA PO) Take 2 tablets by mouth daily.    . montelukast (SINGULAIR) 10 MG tablet Take 10 mg by mouth at bedtime.    Marland Kitchen omalizumab (XOLAIR) 150 MG injection Inject 300 mg into the skin every 28 (twenty-eight) days.    . ondansetron (ZOFRAN-ODT) 4 MG disintegrating tablet Take 4 mg by mouth every 8 (eight) hours as needed for nausea or vomiting.     [name] VERIO test strip USE TO CHECK BLOOD SUGAR UP TO TWICE DAILY 100 strip 5  . OVER THE COUNTER MEDICATION Take 1 capsule by mouth daily. Bladder wak    . OVER THE COUNTER MEDICATION Take 1 capsule by mouth daily. [employer]    . OVER THE COUNTER MEDICATION Take 1 tablet by mouth daily. 7 support joint and comfort    . pantoprazole (PROTONIX) 40 MG tablet TAKE 1 TABLET(40 MG) BY MOUTH DAILY 90 tablet 1  . PATADAY 0.2 % SOLN Place 1 drop into both eyes daily as needed (allergies).   4  . Polyethyl Glycol-Propyl Glycol (SYSTANE OP) Place 1 drop into both eyes in the morning and at bedtime.    . potassium chloride SA (KLOR-CON) 20 MEQ tablet Take 1 tablet (20 mEq total) by mouth 2 (two) times daily. 60 tablet 0  . Pumpkin Seed-Soy Germ (AZO BLADDER CONTROL/GO-LESS PO) Take 1 tablet by mouth in the morning and at bedtime.    . Tiotropium Bromide Monohydrate (SPIRIVA RESPIMAT) 1.25 MCG/ACT AERS Inhale 2 puffs into the  lungs daily. 4 g 5  . TRULANCE 3 MG TABS Take 3 mg by mouth every other day.    . Turmeric 500 MG CAPS Take 1,000 mg by mouth daily.    Alicia Reasons 20 MG TABS tablet TAKE 1 TABLET(20  MG) BY MOUTH DAILY WITH SUPPER 90 tablet 1  . montelukast (SINGULAIR) 10 MG tablet TAKE 1 TABLET(10 MG) BY MOUTH DAILY (Patient taking differently: Take 10 mg by mouth at bedtime.) 90 tablet 1  . methimazole (TAPAZOLE) 5 MG tablet Take 0.5 tablets (2.5 mg total) by mouth daily. (Patient taking differently: Take 2.5 mg by mouth every other day.) 45 tablet 0   No facility-administered medications prior to visit.     Per HPI unless specifically indicated in ROS section below Review of Systems  Constitutional: Negative for fatigue and fever.  HENT: Negative for congestion.   Eyes: Negative for pain.  Respiratory: Negative for cough and shortness of breath.   Cardiovascular: Negative for chest pain, palpitations and leg swelling.  Gastrointestinal: Negative for abdominal pain.  Genitourinary: Negative for dysuria and vaginal bleeding.  Musculoskeletal: Negative for back pain.  Neurological: Negative for syncope, light-headedness and headaches.  Psychiatric/Behavioral: Negative for dysphoric mood.   Objective:  BP 124/70   Pulse 62   Temp 98.1 F (36.7 C) (Temporal)   Ht 5' 3.5" (1.613 m)   Wt 205 lb (93 kg)   SpO2 97%   BMI 35.74 kg/m   Wt Readings from Last 3 Encounters:  11/25/20 205 lb (93 kg)  10/26/20 199 lb 3.2 oz (90.4 kg)  10/14/20 203 lb (92.1 kg)      Physical Exam Constitutional:      General: She is not in acute distress.    Appearance: Normal appearance. She is well-developed. She is not ill-appearing or toxic-appearing.  HENT:     Head: Normocephalic.     Right Ear: Hearing, tympanic membrane, ear canal and external ear normal. Tympanic membrane is not erythematous, retracted or bulging.     Left Ear: Hearing, tympanic membrane, ear canal and external ear normal. Tympanic membrane is  not erythematous, retracted or bulging.     Nose: No mucosal edema or rhinorrhea.     Right Sinus: No maxillary sinus tenderness or frontal sinus tenderness.     Left Sinus: No maxillary sinus tenderness or frontal sinus tenderness.     Mouth/Throat:     Pharynx: Uvula midline.  Eyes:     General: Lids are normal. Lids are everted, no foreign bodies appreciated.     Conjunctiva/sclera: Conjunctivae normal.     Pupils: Pupils are equal, round, and reactive to light.  Neck:     Thyroid: No thyroid mass or thyromegaly.     Vascular: No carotid bruit.     Trachea: Trachea normal.  Cardiovascular:     Rate and Rhythm: Normal rate and regular rhythm.     Pulses: Normal pulses.     Heart sounds: Normal heart sounds, S1 normal and S2 normal. No murmur heard. No friction rub. No gallop.   Pulmonary:     Effort: Pulmonary effort is normal. No tachypnea or respiratory distress.     Breath sounds: Normal breath sounds. No decreased breath sounds, wheezing, rhonchi or rales.  Abdominal:     General: Bowel sounds are normal.     Palpations: Abdomen is soft.     Tenderness: There is no abdominal tenderness.  Musculoskeletal:     Cervical back: Normal range of motion and neck supple.  Skin:    General: Skin is warm and dry.     Findings: Ecchymosis present. No erythema or rash.          Comments:  R leg swelling > left  nodule   bilateral varicosities  Neurological:     Mental Status: She is alert.  Psychiatric:        Mood and Affect: Mood is not anxious or depressed.        Speech: Speech normal.        Behavior: Behavior normal. Behavior is cooperative.        Thought Content: Thought content normal.        Judgment: Judgment normal.       Results for orders placed or performed during the hospital encounter of 11/21/20  SARS CORONAVIRUS 2 (TAT 6-24 HRS) Nasopharyngeal Nasopharyngeal Swab   Specimen: Nasopharyngeal Swab  Result Value Ref Range   SARS Coronavirus 2 NEGATIVE  NEGATIVE   *Note: Due to a large number of results and/or encounters for the requested time period, some results have not been displayed. A complete set of results can be found in Results Review.    This visit occurred during the SARS-CoV-2 public health emergency.  Safety protocols were in place, including screening questions prior to the visit, additional usage of staff PPE, and extensive cleaning of exam room while observing appropriate contact time as indicated for disinfecting solutions.   COVID 19 screen:  No recent travel or known exposure to COVID19 The patient denies respiratory symptoms of COVID 19 at this time. The importance of social distancing was discussed today.   Assessment and Plan    Problem List Items Addressed This Visit    Contusion of right lower leg - Primary     No clear sign of DVT.Marland Kitchen no calf pain... has contusion and possible small hematoma anteriorly.  On Xarelto.   Encouraged ice, elevation and tylenol for pain.      DVT (deep venous thrombosis) hx of        [name], MD

## 2020-11-26 ENCOUNTER — Other Ambulatory Visit: Payer: Self-pay | Admitting: Allergy and Immunology

## 2020-11-28 ENCOUNTER — Telehealth: Payer: Self-pay | Admitting: Cardiovascular Disease

## 2020-11-28 NOTE — Telephone Encounter (Signed)
Pt c/o medication issue:  1. Name of Medication: XARELTO 20 MG TABS tablet  2. How are you currently taking this medication (dosage and times per day)? A prescribed  3. Are you having a reaction (difficulty breathing--STAT)? NO  4. What is your medication issue? PT is calling to confirm that it is OK to get her fingers injected to stop the crampiang in her hand.PT states that her hands cramp bad and she has had athe injections before

## 2020-11-28 NOTE — Telephone Encounter (Signed)
Spoke with pt regarding pt wanting to get injections in her hands to help with cramping. Pt states that she can wait until she is seen by Dr. Sallyanne Kuster on 6/9. Pt would like to know if she needs to be off her xarelto prior to this procedure. Will send to Dr. Sallyanne Kuster to advise.

## 2020-11-28 NOTE — Telephone Encounter (Signed)
We need uninterrupted anticoagulation for at least 4 weeks before and 4 weeks after the cardioversion. If the injections require stopping the Xarelto, we cannot do the cardioversion. OK to get the shots if they can be safely done while on Xarelto. Otherwise, unless cramps are truly incapacitating, please plan on getting the injections in about 7-8 more weeks.

## 2020-11-29 ENCOUNTER — Other Ambulatory Visit (INDEPENDENT_AMBULATORY_CARE_PROVIDER_SITE_OTHER): Payer: Medicare Other

## 2020-11-29 ENCOUNTER — Other Ambulatory Visit: Payer: Self-pay

## 2020-11-29 ENCOUNTER — Encounter: Payer: Self-pay | Admitting: Allergy and Immunology

## 2020-11-29 ENCOUNTER — Ambulatory Visit (INDEPENDENT_AMBULATORY_CARE_PROVIDER_SITE_OTHER): Payer: Medicare Other | Admitting: Allergy and Immunology

## 2020-11-29 VITALS — BP 130/68 | HR 76 | Temp 97.4°F | Resp 14

## 2020-11-29 DIAGNOSIS — J3089 Other allergic rhinitis: Secondary | ICD-10-CM | POA: Diagnosis not present

## 2020-11-29 DIAGNOSIS — J4550 Severe persistent asthma, uncomplicated: Secondary | ICD-10-CM

## 2020-11-29 DIAGNOSIS — K219 Gastro-esophageal reflux disease without esophagitis: Secondary | ICD-10-CM | POA: Diagnosis not present

## 2020-11-29 DIAGNOSIS — E0500 Thyrotoxicosis with diffuse goiter without thyrotoxic crisis or storm: Secondary | ICD-10-CM

## 2020-11-29 LAB — T3, FREE: T3, Free: 2.7 pg/mL (ref 2.3–4.2)

## 2020-11-29 LAB — TSH: TSH: 3.16 u[IU]/mL (ref 0.35–4.50)

## 2020-11-29 MED ORDER — EPINEPHRINE 0.3 MG/0.3ML IJ SOAJ: INTRAMUSCULAR | 2 refills | Status: DC

## 2020-11-29 NOTE — Progress Notes (Signed)
[name]   Follow-up Note  Referring Provider: [name], MD Primary Provider: [name], MD Date of Office Visit: 11/29/2020  Subjective:   [name] (DOB: 11/23/1945) is a 75 y.o. female who returns to [facility] on 11/29/2020 in re-evaluation of the following:  HPI: [name] returns to this clinic in reevaluation of her asthma, allergic rhinitis, LPR, in the context of pulmonary hypertension and diastolic dysfunction.  I last saw her in this clinic on 31 May 2020 and she visited with Dr. Maudie Mercury on 07 October 2020.  During her last visit with Dr. Maudie Mercury she had a slight flareup of both her upper and lower airway issue treated with a very short course of systemic steroids and she has done very well since that point in time.  She did undergo a cardioversion a few weeks back and had a little bit of irritation with her airway requiring her to use nebulized albuterol for a few days but otherwise that has resolved as well.  She did not need to add in her Flovent to her Advair during that timeframe.    So, for the most part, her asthma has been under very good control and she rarely uses a short acting bronchodilator and she has not had to activate her "action plan" with addition of Flovent.  She is very limited in her ability to exert herself because of multiple musculoskeletal issues. She has not had to use any nystatin after using Advair in a while as she has had no problems with thrush recently  Her nose has been doing quite well at this point in time.  She uses nasal ipratropium and occasionally some antihistamines.  Her reflux has been under very good control on a combination of her proton pump inhibitor and H2 receptor blocker.  Her cardioversion was successful.  She has been having a lot of bruising issues while using Xarelto.  Allergies as of 11/29/2020      Reactions   Latex Hives   Penicillins Swelling    Has patient had a PCN reaction causing immediate rash, facial/tongue/throat swelling, SOB or lightheadedness with hypotension patient had a PCN reaction causing severe rash involving mucus membranes or skin necrosis: [id number] Has patient had a PCN reaction that required hospitalization/No Has patient had a PCN reaction occurring within the last 10 years: No If all of the above answers are "NO", then may proceed with Cephalosporin use.   Oxycodone-acetaminophen Nausea And Vomiting   Lyrica [pregabalin] Other (See Comments)   Blurred vision.   Nickel Rash   Phenytoin Sodium Extended Swelling   Vicodin [hydrocodone-acetaminophen] Nausea And Vomiting      Medication List      acetaminophen 650 MG CR tablet Commonly known as: TYLENOL Take 1,300 mg by mouth daily as needed for pain.   Acetaminophen-Codeine 300-30 MG tablet Take 1 tablet by mouth every 6 (six) hours as needed for pain.   Advair HFA 230-21 MCG/ACT inhaler Generic drug: fluticasone-salmeterol INHALE 2 PUFFS BY MOUTH TWICE DAILY TO PREVENT COUGH OR WHEEZING, RINSE MOUTH AFTER USE   albuterol 108 (90 Base) MCG/ACT inhaler Commonly known as: VENTOLIN HFA INHALE 2 PUFFS BY MOUTH INTO THE LUNGS EVERY 6 HOURS AS NEEDED FOR WHEEZING OR SHORTNESS OF BREATH   atorvastatin 10 MG tablet Commonly known as: LIPITOR TAKE 1 TABLET(10 MG) BY MOUTH DAILY   AZO BLADDER CONTROL/GO-LESS PO Take 1 tablet by mouth in  the morning and at bedtime.   CoQ-10 100 MG Caps Take 100 mg by mouth daily.   CORICIDIN HBP PO Take 1 tablet by mouth daily as needed (cough/cold).   cyclobenzaprine 10 MG tablet Commonly known as: FLEXERIL TAKE 1 TABLET(10 MG) BY MOUTH AT BEDTIME AS NEEDED FOR MUSCLE SPASMS   DAIRY-RELIEF PO Take 1 capsule by mouth daily as needed (eating dairy).   dextromethorphan-guaiFENesin 30-600 MG 12hr tablet Commonly known as: MUCINEX DM Take 1 tablet by mouth 2 (two) times daily.   EPINEPHrine 0.3 mg/0.3 mL Soaj  injection Commonly known as: EPI-PEN USE AS DIRECTED FOR LIFE THREATENING ALLERGIC REACTIONS   famotidine 40 MG tablet Commonly known as: PEPCID TAKE 1 TABLET(40 MG) BY MOUTH DAILY   Flovent HFA 220 MCG/ACT inhaler Generic drug: fluticasone INHALE 2 PUFFS BY MOUTH TWICE DAILY DURING FLARE UP AS NEEDED. RINSE, GARGLE AND SPIT AFTER USE   furosemide 20 MG tablet Commonly known as: LASIX Take 1 tablet (20 mg total) by mouth daily.   gabapentin 600 MG tablet Commonly known as: NEURONTIN TAKE 1 TABLET BY MOUTH DAILY AT BREAKFAST, 1 TABLET AT LUNCH, AND 2 TABLETS AT BEDTIME   GARLIC PO Take 1 tablet by mouth daily.   Glyxambi 10-5 MG Tabs Generic drug: Empagliflozin-linaGLIPtin TAKE 1 TABLET BY MOUTH DAILY   hydrOXYzine 10 MG tablet Commonly known as: ATARAX/VISTARIL Take 1 tablet (10 mg total) by mouth daily as needed for itching.   ipratropium 0.06 % nasal spray Commonly known as: ATROVENT USE 1 TO 2 SPRAYS IN EACH NOSTRIL 1 TO 2 TIMES PER DAY   irbesartan 150 MG tablet Commonly known as: AVAPRO TAKE 1 TABLET(150 MG) BY MOUTH DAILY   Iron 325 (65 Fe) MG Tabs Take 325 mg by mouth daily.   levalbuterol 1.25 MG/3ML nebulizer solution Commonly known as: XOPENEX USE 1 VIAL VIA NEBULIZER EVERY 6 HOURS AS NEEDED FOR WHEEZING OR SHORTNESS OF BREATH   liver oil-zinc oxide 40 % ointment Commonly known as: DESITIN Apply 1 application topically as needed for irritation.   meclizine 12.5 MG tablet Commonly known as: ANTIVERT Take 1 tablet (12.5 mg total) by mouth 3 (three) times daily as needed for dizziness.   methimazole 5 MG tablet Commonly known as: TAPAZOLE Take 2.5 mg by mouth every other day.   montelukast 10 MG tablet Commonly known as: SINGULAIR Take 10 mg by mouth at bedtime.   NEURIVA PO Take 2 tablets by mouth daily.   omalizumab 150 MG injection Commonly known as: XOLAIR Inject 300 mg into the skin every 28 (twenty-eight) days.   ondansetron 4 MG  disintegrating tablet Commonly known as: ZOFRAN-ODT Take 4 mg by mouth every 8 (eight) hours as needed for nausea or vomiting.   OneTouch Delica Plus [id number] Misc USE TO CHECK BLOOD SUGAR UP TO TWICE DAILY   OneTouch Verio test strip Generic drug: glucose blood USE TO CHECK BLOOD SUGAR UP TO TWICE DAILY   OneTouch Verio w/Device Kit Use to check blood sugar up to 2 times a day   OVER THE COUNTER MEDICATION Take 1 capsule by mouth daily. Bladder wak   OVER THE COUNTER MEDICATION Take 1 capsule by mouth daily. [employer]   OVER THE COUNTER MEDICATION Take 1 tablet by mouth daily. 7 support joint and comfort   pantoprazole 40 MG tablet Commonly known as: PROTONIX TAKE 1 TABLET(40 MG) BY MOUTH DAILY   Pataday 0.2 % Soln Generic drug: Olopatadine HCl Place 1 drop into both  eyes daily as needed (allergies).   potassium chloride SA 20 MEQ tablet Commonly known as: KLOR-CON Take 1 tablet (20 mEq total) by mouth 2 (two) times daily.   SALONPAS JET SPRAY EX Apply 1 spray topically daily as needed (knee pain).   Spiriva Respimat 1.25 MCG/ACT Aers Generic drug: Tiotropium Bromide Monohydrate Inhale 2 puffs into the lungs daily.   SYSTANE OP Place 1 drop into both eyes in the morning and at bedtime.   IWPYKDXIP RELIEF EX Apply 1 spray topically daily as needed (muscle cramps).   Trulance 3 MG Tabs Generic drug: Plecanatide Take 3 mg by mouth every other day.   Turmeric 500 MG Caps Take 1,000 mg by mouth daily.   Xarelto 20 MG Tabs tablet Generic drug: rivaroxaban TAKE 1 TABLET(20 MG) BY MOUTH DAILY WITH SUPPER       Past Medical History:  Diagnosis Date  . Allergic rhinitis   . Allergy   . Arthritis   . Asthma   . Chronic headache   . Diabetes mellitus   . DVT (deep venous thrombosis) ([place])   . Dyspnea   . Heart murmur   . Hypertension   . Penicillin allergy 01/19/2020  . Pulmonary embolism ([place])   . Pulmonary hypertension ([place])   . Seizures ([place])      Past Surgical History:  Procedure Laterality Date  . ABDOMINAL HYSTERECTOMY     partial, has ovaries  . CARDIAC CATHETERIZATION  12/28/2010   Mod. pulmonary hypertension, normal coronary arteries  . CARDIOVERSION N/A 11/23/2020   Procedure: CARDIOVERSION;  Surgeon: Sanda Klein, MD;  Location: [facility];  Service: Cardiovascular;  Laterality: N/A;  . DOPPLER ECHOCARDIOGRAPHY  10/08/2011   EF=>55%,mild asymmetric LVH, mod. TR, mod. PH, mild to mod LA dilatation  . IR LUMBAR DISC ASPIRATION W/IMG GUIDE  01/12/2020  . KNEE ARTHROSCOPY Left   . KNEE SURGERY    . Nuclear Stress Test  05/20/2006   No ischemia  . PARTIAL HYSTERECTOMY    . PLANTAR FASCIA SURGERY    . TONSILLECTOMY      Review of systems negative except as noted in HPI / PMHx or noted below:  Review of Systems  Constitutional: Negative.   HENT: Negative.   Eyes: Negative.   Respiratory: Negative.   Cardiovascular: Negative.   Gastrointestinal: Negative.   Genitourinary: Negative.   Musculoskeletal: Negative.   Skin: Negative.   Neurological: Negative.   Endo/Heme/Allergies: Negative.   Psychiatric/Behavioral: Negative.      Objective:   Vitals:   11/29/20 1115  BP: 130/68  Pulse: 76  Resp: 14  Temp: (!) 97.4 F (36.3 C)  SpO2: 96%          Physical Exam Constitutional:      Appearance: She is not diaphoretic.  HENT:     Head: Normocephalic.     Right Ear: Tympanic membrane, ear canal and external ear normal.     Left Ear: Tympanic membrane, ear canal and external ear normal.     Nose: Nose normal. No mucosal edema or rhinorrhea.     Mouth/Throat:     Pharynx: Uvula midline. No oropharyngeal exudate.  Eyes:     Conjunctiva/sclera: Conjunctivae normal.  Neck:     Thyroid: No thyromegaly.     Trachea: Trachea normal. No tracheal tenderness or tracheal deviation.  Cardiovascular:     Rate and Rhythm: Normal rate and regular rhythm.     Heart sounds: S1 normal and S2 normal. Murmur  (Systolic) heard.  Pulmonary:     Effort: No respiratory distress.     Breath sounds: Normal breath sounds. No stridor. No wheezing or rales.  Lymphadenopathy:     Head:     Right side of head: No tonsillar adenopathy.     Left side of head: No tonsillar adenopathy.     Cervical: No cervical adenopathy.  Skin:    Findings: No erythema or rash.     Nails: There is no clubbing.  Neurological:     Mental Status: She is alert.     Diagnostics:    Spirometry was performed and demonstrated an FEV1 of 1.67 at 96 % of predicted.  The patient had an Asthma Control Test with the following results: ACT Total Score: 20.    Assessment and Plan:   1. Asthma, severe persistent, well-controlled   2. Other allergic rhinitis   3. LPRD (laryngopharyngeal reflux disease)     1. Continue Advair 230 - 2 inhalations twice a day  2. Continue Spiriva 2.5 respimat - two inhalations one time per day.  3. Continue Montelukast 22m - one tablet one time per day.  4. Continue Xolair (and Epi-Pen)  5. Continue pantoprazole 430min the AM and Famotidine 40 mg in the PM  6. If needed:   A. Nasal saline several times per day  B. Mucinex DM two times per day  C. Ipratropium 0.06% -1-2 sprays each nostril 1-2 times per day to dry nose  D. Proventil HFA or albuterol nebulization  E. nystatin 5 mL swish and swallow after Advair 2 times a day  7. Add Flovent 220 - 2 inhalations 2 times a day during "FLARE UP"  8. Return to clinic in 6 months or earlier if there is a problem.  MoJacquleneppears to be doing quite well at this point in time and I am not going to change any of her therapy and she will continue to use Advair and Spiriva and montelukast and omalizumab as anti-inflammatory therapy for her respiratory tract and continue to treat reflux as noted above.  I will see her back in this clinic in 6 months or earlier if there is a problem.  ErAllena KatzMD Allergy / Immunology [facility]

## 2020-11-29 NOTE — Patient Instructions (Addendum)
  1. Continue Advair 230 - 2 inhalations twice a day  2. Continue Spiriva 2.5 respimat - two inhalations one time per day.  3. Continue Montelukast 10mg  - one tablet one time per day.  4. Continue Xolair (and Epi-Pen)  5. Continue pantoprazole 40mg  in the AM and Famotidine 40 mg in the PM  6. If needed:   A. Nasal saline several times per day  B. Mucinex DM two times per day  C. Ipratropium 0.06% -1-2 sprays each nostril 1-2 times per day to dry nose  D. Proventil HFA or albuterol nebulization  E. nystatin 5 mL swish and swallow after Advair 2 times a day  7. Add Flovent 220 - 2 inhalations 2 times a day during "FLARE UP"  8. Return to clinic in 6 months or earlier if there is a problem.

## 2020-11-30 ENCOUNTER — Encounter: Payer: Self-pay | Admitting: Allergy and Immunology

## 2020-11-30 LAB — T4, FREE: Free T4: 1.05 ng/dL (ref 0.60–1.60)

## 2020-11-30 NOTE — Telephone Encounter (Signed)
Left a message for the patient to call back.  

## 2020-12-01 NOTE — Telephone Encounter (Signed)
The patient has been called and made aware. She will hold off for now. Appointment 12/22/20 with Dr. Sallyanne Kuster.

## 2020-12-02 NOTE — H&P (Signed)
Cardiology Office Note    Date:  10/27/2020   ID:  Alicia, Price 02-Sep-1945, MRN [id number]  PCP:  [name], MD     Cardiologist:   Sanda Klein, MD   Chief complaint: ED CHF visit follow-up   History of Present Illness:  [name] is a 75 y.o. female with paroxysmal atrial fibrillation, remote DVT/PE, moderate pulmonary hypertension, chronic reactive airway disease and degenerative joint disease returning for follow-up.  She was seen in the emergency room on 10/10/2020 with complaints of both shortness of breath and chest discomfort.  She appeared to describe orthopnea.  She is also had a retrosternal discomfort that has occurred episodically and not predictably.  It occurred after a meal initially, but then recurred a few days later not associated with a meal.  She describes it as a "gas" sensation in her chest, but belching does not always improve the discomfort.  She noticed increased wheezing for about 4 days before her emergency room visit.  She had already been taking some prednisone because of this.  She also had ankle swelling.  She had not been taking any furosemide in months and she has not had any fluid issues.  The emergency room evaluation showed no significant elevation in high-sensitivity troponin (24-22) and her ECG was interpreted as showing sinus rhythm and did not show any ischemic changes.  Her BNP was markedly elevated at 645 (baseline BNP less than 100) and the chest x-ray showed "stable cardiac enlargement.  Central vascular prominence.  Aortic atherosclerosis.  Trace bilateral effusions".  She was given a prescription for diuretics.  An echocardiogram has been ordered but has not yet been performed.  Today in the office she reports that her breathing is markedly improved.  She feels a little tired.  She has lost 13 pounds in weight in the last couple of weeks.  Labs were checked today and show that her BNP level is now normal and her BUN and  creatinine are higher than baseline.  She has had a few more episodes of "gas" in her chest, but this is improving.  She no longer has any edema.  Prior to her emergency room visit she generally been doing well.  She has been focusing a lot on improving her diet and is mostly vegetarian, even considering moving to a vegan diet.  She has lost substantial weight and is now only moderately obese.  Her most recent hemoglobin A1c was 6.2% and her most recent LDL cholesterol was 56.  She has a lot of problems with pain in her back and pain in her right knee and she will be seeing a new orthopedic surgeon to discuss right knee replacement.  She is compliant with Xarelto and has not had any serious falls, injuries or bleeding problems.  She denies dizziness, syncope, palpitations, focal neurological complaints or intermittent claudication.  Activity is limited by her orthopedic complaints.  Her last documented episode of atrial fibrillation was about 5 years ago.    Previous coronary angiography showed no evidence of CAD but she did have moderate pulmonary artery hypertension by right heart catheterization (systolic PAP 50 mmHg).  She has had a variety of noncardiac issues including recurrent issues with diarrhea (she is seeing Dr. Cristina Gong) and continued hyperthyroidism controlled on methimazole (she is seeing Dr. Cruzita Lederer).    She continues to have issues with thoracic spine related pain (Dr. Gladstone Lighter). She has had a previous equivocal workup for hypercoagulable conditions (lupus anticoagulant positive on  one of 2 separate assays, protein S activity decreased with normal total protein S level).        Past Medical History:  Diagnosis Date  . Allergic rhinitis   . Allergy   . Arthritis   . Asthma   . Chronic headache   . Diabetes mellitus   . DVT (deep venous thrombosis) ([place])   . Dyspnea   . Heart murmur   . Hypertension   . Penicillin allergy 01/19/2020  . Pulmonary embolism ([place])    . Pulmonary hypertension ([place])   . Seizures ([place])          Past Surgical History:  Procedure Laterality Date  . ABDOMINAL HYSTERECTOMY     partial, has ovaries  . CARDIAC CATHETERIZATION  12/28/2010   Mod. pulmonary hypertension, normal coronary arteries  . DOPPLER ECHOCARDIOGRAPHY  10/08/2011   EF=>55%,mild asymmetric LVH, mod. TR, mod. PH, mild to mod LA dilatation  . IR LUMBAR DISC ASPIRATION W/IMG GUIDE  01/12/2020  . KNEE ARTHROSCOPY Left   . KNEE SURGERY    . Nuclear Stress Test  05/20/2006   No ischemia  . PARTIAL HYSTERECTOMY    . PLANTAR FASCIA SURGERY    . TONSILLECTOMY      Current Medications:       Outpatient Medications Prior to Visit  Medication Sig Dispense Refill  . acetaminophen (TYLENOL) 650 MG CR tablet Take 1,300 mg by mouth daily as needed for pain.    . Acetaminophen-Codeine 300-30 MG tablet     . ADVAIR HFA 230-21 MCG/ACT inhaler INHALE 2 PUFFS BY MOUTH TWICE DAILY TO PREVENT COUGH OR WHEEZING, RINSE MOUTH AFTER USE 12 g 4  . albuterol (VENTOLIN HFA) 108 (90 Base) MCG/ACT inhaler INHALE 2 PUFFS BY MOUTH INTO THE LUNGS EVERY 6 HOURS AS NEEDED FOR WHEEZING OR SHORTNESS OF BREATH 54 g 0  . antiseptic oral rinse (BIOTENE) LIQD 15 mLs by Mouth Rinse route in the morning and at bedtime.    Marland Kitchen Apoaequorin (PREVAGEN) 10 MG CAPS Take 10 mg by mouth daily.    . Artificial Saliva (BIOTENE DRY MOUTH) LOZG Use as directed 1 lozenge in the mouth or throat daily as needed (dry eyes).    Marland Kitchen atorvastatin (LIPITOR) 10 MG tablet TAKE 1 TABLET(10 MG) BY MOUTH DAILY 90 tablet 1  . Blood Glucose Monitoring Suppl (ONETOUCH VERIO) w/Device KIT Use to check blood sugar up to 2 times a day 1 kit 0  . Capsaicin-Menthol (SALONPAS GEL EX) Apply 1 application topically daily as needed (pain).    . Coenzyme Q10 (COQ-10) 100 MG CAPS Take 100 mg by mouth daily.    . cyclobenzaprine (FLEXERIL) 10 MG tablet TAKE 1 TABLET(10 MG) BY MOUTH AT BEDTIME AS  NEEDED FOR MUSCLE SPASMS 30 tablet 1  . dextromethorphan-guaiFENesin (MUCINEX DM) 30-600 MG 12hr tablet Take 1 tablet by mouth 2 (two) times daily as needed for cough.    . diazepam (VALIUM) 10 MG tablet     . diclofenac Sodium (VOLTAREN) 1 % GEL Apply 2 g topically 4 (four) times daily as needed.    . DULoxetine (CYMBALTA) 30 MG capsule     . EPINEPHrine 0.3 mg/0.3 mL IJ SOAJ injection USE AS DIRECTED FOR LIFE THREATENING ALLERGIC REACTIONS 2 each 2  . famotidine (PEPCID) 40 MG tablet TAKE 1 TABLET(40 MG) BY MOUTH DAILY 30 tablet 3  . Ferrous Sulfate (IRON) 325 (65 Fe) MG TABS Take 1 tablet by mouth daily.    . fluticasone (FLOVENT HFA)  220 MCG/ACT inhaler INHALE 2 PUFFS BY MOUTH TWICE DAILY DURING FLARE UP AS NEEDED. RINSE, GARGLE AND SPIT AFTER USE 12 g 1  . furosemide (LASIX) 40 MG tablet Take 1 tablet (40 mg total) by mouth daily. 30 tablet 0  . gabapentin (NEURONTIN) 600 MG tablet TAKE 1 TABLET BY MOUTH DAILY AT BREAKFAST, 1 TABLET AT LUNCH, AND 2 TABLETS AT BEDTIME 360 tablet 1  . Garlic 157 MG TBEC Take 1 tablet by mouth daily.    Marland Kitchen GLYXAMBI 10-5 MG TABS TAKE 1 TABLET BY MOUTH DAILY 30 tablet 11  . Homeopathic Products [facility] RELIEF EX) Apply 1 spray topically daily as needed (muscle cramps).    . hydrOXYzine (ATARAX/VISTARIL) 10 MG tablet Take 1 tablet (10 mg total) by mouth daily as needed for itching. 15 tablet 0  . ipratropium (ATROVENT) 0.06 % nasal spray Place 2 sprays into both nostrils 2 (two) times daily as needed for rhinitis.    Marland Kitchen irbesartan (AVAPRO) 150 MG tablet TAKE 1 TABLET(150 MG) BY MOUTH DAILY 30 tablet 8  . Lactase (DAIRY-RELIEF PO) Take 1 capsule by mouth daily as needed (eating dairy).     . Lancets (ONETOUCH DELICA PLUS [id number]) MISC USE TO CHECK BLOOD SUGAR UP TO TWICE DAILY 100 each 5  . levalbuterol (XOPENEX) 1.25 MG/3ML nebulizer solution USE 1 VIAL VIA NEBULIZER EVERY 6 HOURS AS NEEDED FOR WHEEZING OR SHORTNESS OF BREATH 9 mL 1  .  meclizine (ANTIVERT) 12.5 MG tablet Take 1 tablet (12.5 mg total) by mouth 3 (three) times daily as needed for dizziness. 15 tablet 0  . methimazole (TAPAZOLE) 5 MG tablet Take 0.5 tablets (2.5 mg total) by mouth daily. 45 tablet 0  . Misc Natural Products (GLUCOSAMINE CHONDROITIN TRIPLE) TABS Take 2 tablets by mouth daily.    . montelukast (SINGULAIR) 10 MG tablet TAKE 1 TABLET(10 MG) BY MOUTH DAILY 90 tablet 1  . mupirocin cream (BACTROBAN) 2 % Apply 1 application topically 2 (two) times daily. 15 g 0  . omalizumab (XOLAIR) 150 MG injection Inject 300 mg into the skin every 28 (twenty-eight) days.    . ondansetron (ZOFRAN-ODT) 4 MG disintegrating tablet Take 4 mg by mouth every 8 (eight) hours as needed for nausea or vomiting.     [name] VERIO test strip USE TO CHECK BLOOD SUGAR UP TO TWICE DAILY 100 strip 5  . oxybutynin (DITROPAN-XL) 5 MG 24 hr tablet Take 5 mg by mouth daily.    . pantoprazole (PROTONIX) 40 MG tablet TAKE 1 TABLET(40 MG) BY MOUTH DAILY 90 tablet 1  . PATADAY 0.2 % SOLN Place 1 drop into both eyes daily as needed (allergies).   4  . potassium chloride SA (KLOR-CON) 20 MEQ tablet Take 1 tablet (20 mEq total) by mouth 2 (two) times daily. 60 tablet 0  . Tiotropium Bromide Monohydrate (SPIRIVA RESPIMAT) 1.25 MCG/ACT AERS Inhale 2 puffs into the lungs daily. 4 g 5  . tolterodine (DETROL) 2 MG tablet Take 2 mg by mouth daily.    Marland Kitchen triamcinolone (KENALOG) 0.1 % Apply 1 application topically 2 (two) times daily as needed.    . TRULANCE 3 MG TABS Take 3 mg by mouth daily.     . TURMERIC PO Take 1,000 mg by mouth daily.    Alveda Reasons 20 MG TABS tablet TAKE 1 TABLET(20 MG) BY MOUTH DAILY WITH SUPPER 90 tablet 1   Facility-Administered Medications Prior to Visit  Medication Dose Route Frequency Provider Last Rate Last Admin  .  omalizumab Arvid Right) injection 300 mg  300 mg Subcutaneous Q28 days [name], MD   300 mg at 09/06/20 1124  . omalizumab Arvid Right)  prefilled syringe 300 mg  300 mg Subcutaneous Q28 days Garnet Sierras, DO   300 mg at 10/07/20 1507     Allergies:   Latex, Penicillins, Oxycodone-acetaminophen, Lyrica [pregabalin], Nickel, Phenytoin sodium extended, and Vicodin [hydrocodone-acetaminophen]   Social History        Socioeconomic History  . Marital status: Widowed    Spouse name: Not on file  . Number of children: Y  . Years of education: Not on file  . Highest education level: Not on file  Occupational History  . Occupation: retired Geologist, engineering.   Tobacco Use  . Smoking status: Never Smoker  . Smokeless tobacco: Never Used  Vaping Use  . Vaping Use: Never used  Substance and Sexual Activity  . Alcohol use: Not Currently    Alcohol/week: 0.0 standard drinks    Comment: occ glass on wine  . Drug use: No  . Sexual activity: Never  Other Topics Concern  . Not on file  Social History Narrative   Widow    limited exercise.   Social Determinants of Health      Financial Resource Strain: Low Risk   . Difficulty of Paying Living Expenses: Not hard at all  Food Insecurity: Not on file  Transportation Needs: Not on file  Physical Activity: Not on file  Stress: Not on file  Social Connections: Not on file     Family History:  The patient's family history includes Alcohol abuse in her father; Allergies in an other family member; Arthritis in her mother and sister; Breast cancer in her mother; Cancer in her brother; Clotting disorder in her mother; Diabetes in her mother and sister; Hypertension in her mother; Multiple sclerosis in her sister; Stroke in her mother.   ROS:   Please see the history of present illness.    ROS All other systems reviewed and are negative.   PHYSICAL EXAM:   VS:  BP (!) 90/54 (BP Location: Left Arm, Patient Position: Sitting, Cuff Size: Large)   Pulse 70   Ht 5' 4" (1.626 m)   Wt 199 lb 3.2 oz (90.4 kg)   BMI 34.19 kg/m     General: Alert, oriented x3, no  distress, moderately obese.  Looks a little tired. Head: no evidence of trauma, PERRL, EOMI, no exophtalmos or lid lag, no myxedema, no xanthelasma; normal ears, nose and oropharynx Neck: normal jugular venous pulsations and no hepatojugular reflux; brisk carotid pulses without delay and no carotid bruits Chest: clear to auscultation, no signs of consolidation by percussion or palpation, normal fremitus, symmetrical and full respiratory excursions Cardiovascular: normal position and quality of the apical impulse, regular rhythm, normal first and second heart sounds, no murmurs, rubs or gallops Abdomen: no tenderness or distention, no masses by palpation, no abnormal pulsatility or arterial bruits, normal bowel sounds, no hepatosplenomegaly Extremities: no clubbing, cyanosis or edema; 2+ radial, ulnar and brachial pulses bilaterally; 2+ right femoral, posterior tibial and dorsalis pedis pulses; 2+ left femoral, posterior tibial and dorsalis pedis pulses; no subclavian or femoral bruits Neurological: grossly nonfocal Psych: Normal mood and affect      Wt Readings from Last 3 Encounters:  10/26/20 199 lb 3.2 oz (90.4 kg)  10/14/20 203 lb (92.1 kg)  10/07/20 212 lb 3.2 oz (96.3 kg)      Studies/Labs Reviewed:   EKG:  EKG is not ordered today.  I personally reviewed the 2 tracings from 10/10/2020 I think they show atrial flutter with 4: 1 AV block and a ventricular rate of 82 bpm.  There are indeed no ischemic changes. Recent Labs: 04/20/2020: ALT 7 10/10/2020: Hemoglobin 12.7; Platelets 313 10/18/2020: TSH 4.38 10/26/2020: BNP 83.6; BUN 13; Creatinine, Ser 1.23; Potassium 4.2; Sodium 141   Lipid Panel Labs (Brief)          Component Value Date/Time   CHOL 117 04/20/2020 1131   TRIG 71.0 04/20/2020 1131   HDL 46.60 04/20/2020 1131   CHOLHDL 3 04/20/2020 1131   VLDL 14.2 04/20/2020 1131   LDLCALC 56 04/20/2020 1131       ASSESSMENT:    1. Atypical atrial flutter  ([place])   2. Acute on chronic diastolic congestive heart failure ([place])   3. Essential hypertension   4. Precordial pain   5. History of pulmonary embolism   6. PAH (pulmonary artery hypertension) ([place])   7. Type 2 diabetes mellitus with diabetic neuropathy, without long-term current use of insulin ([place])   8. Hypercholesterolemia   9. Moderate obesity   10. Long term current use of anticoagulant   11. Asthma, moderate persistent   12. Hyperthyroidism      PLAN:  In order of problems listed above:  1. AFlutter: I believe the tracing performed in the emergency room may have actually shown atypical atrial flutter with 4: 1 AV block.  Rate control was good.  It is unclear whether this could have caused her heart failure exacerbation episodes, or was a consequence of it.  She is appropriately anticoagulated and did not have any symptoms of palpitations.  CHADSVasc 4 (age, gender, HTN, DM, CHF).  We did not perform an ECG today, but the rate is slower and regular and I suspect she is back in sinus rhythm.  I do not think there is a convincing reason to prescribe antiarrhythmics. 2. CHF: Presumably this was an episode of acute exacerbation of diastolic heart failure, but her echocardiogram has not yet been performed.  If LVEF is indeed still normal, we will continue the current treatment plan.  However if her EF has diminished, we should test for coronary disease. 3. HTN: Her blood pressure was quite low on presentation at 90/54, but when I rechecked it it was 104/63 still rather low for her.  She may be a little bit overdiuresis.  This is confirmed by her elevated BUN and creatinine today and the fact that her BNP has completely normalized.  We received the results of her labs after she left the office, but will call her back to tell her to decrease her dose of furosemide to 20 mg once daily.  Continue irbesartan and empagliflozin. 4. Chest pain: This has been atypical and mostly has features  suggestive of GI etiology, but she does have numerous risk factors for coronary disease.  We will schedule her for a Lexiscan Myoview.  We will try to avoid contrast based procedures because of her diabetes and renal dysfunction, unless there is compelling evidence of ischemic heart disease on her echocardiogram or perfusion study. 5. Hx DVT/PE: Conflicting results on previous hypercoagulable work-up, but this is probably a moot point since she will require chronic lifelong anticoagulation for atrial arrhythmia anyway. 6. PAH: This was present on her previous echocardiogram in 5621 (peak systolic PA pressure estimated at 46 mmHg).  The recent episode of heart failure exacerbation suggest that it is probably at  least partly related to left ventricular diastolic dysfunction, but it may also be related to longstanding obesity and previous pulmonary embolism. 7. DM: Excellent control.  She is taking an SGLT2 inhibitor. 8. HLP: All lipid parameters in desirable range on atorvastatin. 9. Obesity: She is making steady progress with weight loss and has moved follow morbidly obese to just moderately obese range.  Congratulated her on her efforts.  She is on an SGLT2 inhibitor which will help.  Ongoing weight loss will make it a little hard to be confident in her dry weight. 10. Xarelto: Indicated both for atrial fibrillation and history of venous thromboembolism.  No bleeding complications. 11. Asthma: Avoid nonselective beta-blockers.  Is quite possible that her recent episode of/asthma exacerbation" was actually cardiac asthma due to heart failure.  No wheezing at all today. 12. Hyperthyroidism: On chronic methimazole with recent TSH excellent at 4.38.   Medication Adjustments/Labs and Tests Ordered: Current medicines are reviewed at length with the patient today.  Concerns regarding medicines are outlined above.  Medication changes, Labs and Tests ordered today are listed in the Patient Instructions  below: Patient Instructions  Medication Instructions:  Your physician recommends that you continue on your current medications as directed. Please refer to the Current Medication list given to you today.  *If you need a refill on your cardiac medications before your next appointment, please call your pharmacy*   Lab Work: BNP and BMP to be drawn today.   If you have labs (blood work) drawn today and your tests are completely normal, you will receive your results only by:  [place] (if you have MyChart) OR  A paper copy in the mail If you have any lab test that is abnormal or we need to change your treatment, we will call you to review the results.   Testing/Procedures:  To be done at [place]. Your physician has requested that you have a lexiscan myoview. For further information please visit [email or web address]. Please follow instruction sheet, as given.     Follow-Up: At [facility], you and your health needs are our priority. As part of our continuing mission to provide you with exceptional heart care, we have created designated Provider Care Teams. These Care Teams include your primary Cardiologist (physician) and Advanced Practice Providers (APPs -  Physician Assistants and Nurse Practitioners) who all work together to provide you with the care you need, when you need it.  We recommend signing up for the patient portal called "MyChart".  Sign up information is provided on this After Visit Summary.  MyChart is used to connect with patients for Virtual Visits (Telemedicine).  Patients are able to view lab/test results, encounter notes, upcoming appointments, etc.  Non-urgent messages can be sent to your provider as well.   To learn more about what you can do with MyChart, go to [email or web address].    Your next appointment:   1-2 month(s)  The format for your next appointment:   In Person  Provider:   You may see Sanda Klein, MD  or one of the following Advanced Practice Providers on your designated Care Team:   ? [name], PA-C ? Fabian Sharp, PA-C or  ? Roby Lofts, PA-C        Signed, Sanda Klein, MD  10/27/2020 2:17 PM    [facility] [address], [place], [place]  [address] Phone: [phone]; Fax: [phone]         Patient Instructions by  Cyndi Bender, RN at 10/26/2020 9:20 AM  Author: Cyndi Bender, RN Author Type: Registered Nurse Filed: 10/26/2020 10:19 AM  Note Status: Signed Cosign: Cosign Not Required Encounter Date: 10/26/2020  Editor: Cyndi Bender, RN (Registered Nurse)             Medication Instructions:  Your physician recommends that you continue on your current medications as directed. Please refer to the Current Medication list given to you today.  *If you need a refill on your cardiac medications before your next appointment, please call your pharmacy*   Lab Work: BNP and BMP to be drawn today.   If you have labs (blood work) drawn today and your tests are completely normal, you will receive your results only by:  [place] (if you have MyChart) OR  A paper copy in the mail If you have any lab test that is abnormal or we need to change your treatment, we will call you to review the results.   Testing/Procedures:  To be done at [place]. Your physician has requested that you have a lexiscan myoview. For further information please visit [email or web address]. Please follow instruction sheet, as given.     Follow-Up: At [facility], you and your health needs are our priority. As part of our continuing mission to provide you with exceptional heart care, we have created designated Provider Care Teams. These Care Teams include your primary Cardiologist (physician) and Advanced Practice Providers (APPs -  Physician Assistants and Nurse Practitioners) who all work together to provide you  with the care you need, when you need it.  We recommend signing up for the patient portal called "MyChart".  Sign up information is provided on this After Visit Summary.  MyChart is used to connect with patients for Virtual Visits (Telemedicine).  Patients are able to view lab/test results, encounter notes, upcoming appointments, etc.  Non-urgent messages can be sent to your provider as well.   To learn more about what you can do with MyChart, go to [email or web address].    Your next appointment:   1-2 month(s)  The format for your next appointment:   In Person  Provider:   You may see Sanda Klein, MD or one of the following Advanced Practice Providers on your designated Care Team:   ? [name], PA-C ? Fabian Sharp, PA-C or  ? Roby Lofts, PA-C             Instructions  Medication Instructions:  Your physician recommends that you continue on your current medications as directed. Please refer to the Current Medication list given to you today.  *If you need a refill on your cardiac medications before your next appointment, please call your pharmacy*   Lab Work: BNP and BMP to be drawn today.   If you have labs (blood work) drawn today and your tests are completely normal, you will receive your results only by:  [place] (if you have MyChart) OR  A paper copy in the mail If you have any lab test that is abnormal or we need to change your treatment, we will call you to review the results.   Testing/Procedures:  To be done at [place]. Your physician has requested that you have a lexiscan myoview. For further information please visit [email or web address]. Please follow instruction sheet, as given.     Follow-Up: At [facility], you and your health needs are our priority. As part of our continuing mission to provide you  with exceptional heart care, we have created designated Provider Care Teams. These Care Teams include  your primary Cardiologist (physician) and Advanced Practice Providers (APPs -  Physician Assistants and Nurse Practitioners) who all work together to provide you with the care you need, when you need it.  We recommend signing up for the patient portal called "MyChart".  Sign up information is provided on this After Visit Summary.  MyChart is used to connect with patients for Virtual Visits (Telemedicine).  Patients are able to view lab/test results, encounter notes, upcoming appointments, etc.  Non-urgent messages can be sent to your provider as well.   To learn more about what you can do with MyChart, go to [email or web address].    Your next appointment:   1-2 month(s)  The format for your next appointment:   In Person  Provider:   You may see Sanda Klein, MD or one of the following Advanced Practice Providers on your designated Care Team:   ? [name], PA-C ? Fabian Sharp, PA-C or  ? Roby Lofts, PA-C             After Visit Summary (Printed 10/26/2020)   Communications     [facility] Provider CC Chart Rep sent to [name], MD  Sent 10/27/2020    Media From this encounter Electronic signature on 10/26/2020 9:23 AM - 1 of 3 e-signatures recorded   Communication Routing History  Recipient Method Sent by Date Sent  [name], MD In [place], Jessie, MD 10/27/2020     No questionnaires available.              Orders Placed     Basic metabolic panel     Brain natriuretic peptide     Cardiac Stress Test: Informed Consent Details: Physician/Practitioner Attestation; Transcribe to consent form and obtain patient signature     MYOCARDIAL PERFUSION IMAGING    All Encounter Results   Medication Changes     None    Medication List   Visit Diagnoses     Atypical atrial flutter (HCC)     Acute on chronic diastolic congestive heart failure (HCC)     Essential hypertension     Precordial pain     History of  pulmonary embolism     PAH (pulmonary artery hypertension) (HCC)     Type 2 diabetes mellitus with diabetic neuropathy, without long-term current use of insulin (HCC)     Hypercholesterolemia     Moderate obesity     Long term current use of anticoagulant     Asthma, moderate persistent     Hyperthyroidism    Problem List   Level of Service  Level of Service  PR OFFICE/OUTPATIENT ESTABLISHED MOD MDM 30-39 MIN [42353]  Log History  LOS History   All Charges for This Encounter  Code Description Service Date Service Provider Modifiers Qty  99214 PR OFFICE/OUTPATIENT ESTABLISHED MOD MDM 30-39 MIN 10/26/2020 , Dani Gobble, MD  1  1036F PR CURRENT TOBACCO NON-USER 10/26/2020 , Dani Gobble, MD  1   Encounter Status  Signed by Sanda Klein, MD on 10/27/20 at [place]  I called her to discuss the echo findings. She is still in atrial flutter. I think I was fooled by her regular and slow rhythm when I saw her in clinic and she was probably in persistent atrial flutter with mostly 4:1 AV block all this time. The arrhythmia may have tipped her into fluid overload.She will probably benefit from  cardioversion.  Heart pumping function is normal and there are no overt signs of fluid overload. Although atrial flutter limits the assessment of diastolic function, her mitral annulus velocities are so normal that I do not think she has substantial LV diastolic dysfunction.  Mild pulmonary hypertension and moderate TR, which I think is related to lung problems (OSA) rather than heart failure. She has not been using CPAP for a while, due to her other health issues. She also worries there is something wrong with her machine. She was planning to call Dr. Edwena Felty office to discuss that. Compliance with CPAP going forward will be critical to reduce the likelihood of arrhythmia recurrence. Her weight has been steady at 201-203 lb Will schedule for DC cardioversion  with sedation on May 11.  She asked me to let Dr. Neldon Mc know about the sedation, since he has raised concerns regarding her asthma and anesthesia. I think the very brief sedation with IV propofol will not be a problem. Discussed the fact that we need uninterrupted anticoagulation with Xarelto for 3 weeks before and 1 month after the cardioversion.  She has an Ortho appt at [facility] on May 2 for her right knee arthritis. Pointed out to her that if surgery is planned, it should be scheduled no sooner than June 10. Based on the echo/ECG findings, will cancel the nuclear stress test scheduled on May 3. The cardioversion with sedation has been fully reviewed with the patient and verbal informed consent has been obtained.        History and Physical Interval Note:  11/23/2020 11:34 AM  S[place]ry  has presented today for surgery, with the diagnosis of afib.  The various methods of treatment have been discussed with the patient and family. After consideration of risks, benefits and other options for treatment, the patient has consented to  Procedure(s): CARDIOVERSION (N/A) as a surgical intervention.  The patient's history has been reviewed, patient examined, no change in status, stable for surgery.  I have reviewed the patient's chart and labs.  Questions were answered to the patient's satisfaction.     Cornell Gaber

## 2020-12-05 ENCOUNTER — Ambulatory Visit (INDEPENDENT_AMBULATORY_CARE_PROVIDER_SITE_OTHER): Payer: Medicare Other | Admitting: Podiatry

## 2020-12-05 ENCOUNTER — Other Ambulatory Visit: Payer: Self-pay | Admitting: Cardiovascular Disease

## 2020-12-05 ENCOUNTER — Other Ambulatory Visit: Payer: Self-pay

## 2020-12-05 ENCOUNTER — Encounter: Payer: Self-pay | Admitting: Podiatry

## 2020-12-05 DIAGNOSIS — J4550 Severe persistent asthma, uncomplicated: Secondary | ICD-10-CM | POA: Diagnosis not present

## 2020-12-05 DIAGNOSIS — D2371 Other benign neoplasm of skin of right lower limb, including hip: Secondary | ICD-10-CM | POA: Diagnosis not present

## 2020-12-05 DIAGNOSIS — D2372 Other benign neoplasm of skin of left lower limb, including hip: Secondary | ICD-10-CM | POA: Diagnosis not present

## 2020-12-05 NOTE — Progress Notes (Signed)
She presents today chief complaint of painful calluses bilateral foot.  Objective: Toenails are a little bit elongated reactive hyper keratomas are noted bilateral.  No open lesions or wounds.  Assessment: Diabetic peripheral neuropathy with benign hyperkeratotic lesions.  Plan: Debridement of nails and debridement of benign skin lesion

## 2020-12-06 ENCOUNTER — Ambulatory Visit (INDEPENDENT_AMBULATORY_CARE_PROVIDER_SITE_OTHER): Payer: Medicare Other | Admitting: *Deleted

## 2020-12-06 DIAGNOSIS — J4550 Severe persistent asthma, uncomplicated: Secondary | ICD-10-CM

## 2020-12-06 MED ORDER — OMALIZUMAB 150 MG ~~LOC~~ SOLR
300.0000 mg | SUBCUTANEOUS | Status: DC
  Administered 2020-12-06 – 2022-12-25 (×26): 300 mg via SUBCUTANEOUS

## 2020-12-07 ENCOUNTER — Other Ambulatory Visit: Payer: Self-pay

## 2020-12-07 MED ORDER — POTASSIUM CHLORIDE CRYS ER 20 MEQ PO TBCR: 20.0000 meq | EXTENDED_RELEASE_TABLET | Freq: Two times a day (BID) | ORAL | 3 refills | Status: DC

## 2020-12-09 ENCOUNTER — Telehealth: Payer: Self-pay | Admitting: *Deleted

## 2020-12-09 NOTE — Telephone Encounter (Signed)
Pt called triage asking if I could ask PCP a question. Yesterday pt's blood sugar was 95 when she checked it she wanted to make sure that wasn't to low. Pt said she did feel a little light headed so she took it easy for the rest of the day and felt better today. Today she feels fine with no sxs but she checked her blood sugar and it was 90, pt wants to make sure that her level isn't to low. Today she has no sxs at all and feels fine, just wanted to see what PCP thinks she should do

## 2020-12-09 NOTE — Telephone Encounter (Signed)
Fasting CG 80-120 is normal.

## 2020-12-13 ENCOUNTER — Telehealth: Payer: Self-pay | Admitting: Family Medicine

## 2020-12-13 NOTE — Telephone Encounter (Signed)
Patient called and informed of the levels that are normal for her when she checks her blood sugar. Patient stated that she understood and appreciated the call.

## 2020-12-13 NOTE — Telephone Encounter (Signed)
Pt calling in and states that she would like a call from Providers CMA. Pt  has been having cramps in legs  as well as itching  is wondering if she needs to get back on the medications that were prescribed or what she needs to do.

## 2020-12-14 ENCOUNTER — Other Ambulatory Visit: Payer: Self-pay | Admitting: Allergy and Immunology

## 2020-12-15 ENCOUNTER — Encounter: Payer: Self-pay | Admitting: Internal Medicine

## 2020-12-15 ENCOUNTER — Ambulatory Visit (INDEPENDENT_AMBULATORY_CARE_PROVIDER_SITE_OTHER): Payer: Medicare Other | Admitting: Internal Medicine

## 2020-12-15 ENCOUNTER — Other Ambulatory Visit: Payer: Self-pay

## 2020-12-15 VITALS — BP 110/68 | HR 52 | Ht 63.5 in | Wt 200.8 lb

## 2020-12-15 DIAGNOSIS — E0500 Thyrotoxicosis with diffuse goiter without thyrotoxic crisis or storm: Secondary | ICD-10-CM | POA: Diagnosis not present

## 2020-12-15 LAB — T4, FREE: Free T4: 1.11 ng/dL (ref 0.60–1.60)

## 2020-12-15 LAB — T3, FREE: T3, Free: 2.6 pg/mL (ref 2.3–4.2)

## 2020-12-15 LAB — TSH: TSH: 4.07 u[IU]/mL (ref 0.35–4.50)

## 2020-12-15 NOTE — Patient Instructions (Addendum)
For now, continue 2.5 mg Methimazole every other day.  Please stop at the lab.  Please come back for a follow-up appointment in 6 months.

## 2020-12-15 NOTE — Progress Notes (Addendum)
Patient ID: [name], female   DOB: [birth year], 75 y.o.   MRN: [id number]   This visit occurred during the SARS-CoV-2 public health emergency.  Safety protocols were in place, including screening questions prior to the visit, additional usage of staff PPE, and extensive cleaning of exam room while observing appropriate contact time as indicated for disinfecting solutions.   HPI  [name] is a 75 y.o.-year-old female, returning for f/u for for thyrotoxicosis and thyroid nodule.  She previously saw another endocrinologist (Dr. Wilson Singer), but only for her diabetes. She is the sister of [name], also my pt. Last OV with me 6 months ago.  Interim history: She recently had cardioversion on 11/23/2020 with A. Fib with SOB.  No CP/SOB now. She has itching, she feels this is 2/2 her well water. Will add Chlorine. She had back pain today.  Reviewed and addended history: Patient was found to be thyrotoxic in 08/2015 but she reports that she was only informed about this in 2018.  Thyroid scan indicated possible Graves' disease and also a cold left thyroid nodule.    At that time, she was recommended surgery but she did not want to proceed with this and started to see me.    She was started on methimazole initially 10 mg daily.  Doses were changed afterwards:  Most recently:  In 09/2018 we decreased the dose of methimazole to 2.5 mg daily.    In 04/2020, we stopped methimazole  In 05/2020, we restarted methimazole 5 mg daily (she misunderstood instructions to only start 2.5 mg daily)  In 10/2020, we decreased the methimazole to 2.5 mg daily  In 11/2020, we stopped methimazole (but she takes 2.5 mg every other day)  She also continues on diltiazem CD.  Her left thyroid nodule was biopsied with benign results in 2019.    Reviewed her TFTs: Lab Results  Component Value Date   TSH 3.16 11/29/2020   TSH 4.38 10/18/2020   TSH 2.27 07/21/2020   TSH 0.08 (L) 06/14/2020   TSH 3.82  04/26/2020   TSH 1.25 11/03/2019   TSH 1.39 04/06/2019   TSH 1.84 11/27/2018   TSH 4.30 10/06/2018   TSH 4.03 08/05/2018   FREET4 1.05 11/29/2020   FREET4 0.90 10/18/2020   FREET4 1.06 07/21/2020   FREET4 1.31 06/14/2020   FREET4 0.97 04/26/2020   FREET4 1.02 11/03/2019   FREET4 0.98 04/06/2019   FREET4 0.93 11/27/2018   FREET4 0.75 10/06/2018   FREET4 0.88 08/05/2018   T3FREE 2.7 11/29/2020   T3FREE 2.3 10/18/2020   T3FREE 2.5 07/21/2020   T3FREE 3.8 06/14/2020   T3FREE 3.1 04/26/2020   T3FREE 2.5 11/03/2019   T3FREE 2.7 04/06/2019   T3FREE 3.0 11/27/2018   T3FREE 3.1 10/06/2018   T3FREE 2.9 08/05/2018   Thyroid uptake and scan (10/31/2017): Increased uptake of 40%, and cold left inferior pole nodule.  Thyroid U/S (11/12/2017): 3.2 cm left inferior TR 3 nodule correlates with the cold nodule by nuclear medicine scan. This nodule meets criteria for biopsy as above.  FNA of her left thyroid nodule (12/04/2017): Benign  Thyroid U/S (10/23/2019): Slightly enlarged gland but not due to enlargement of the thyroid nodule. It is possible that this is due to more inflammation. Parenchymal Echotexture: Mildly heterogenous Isthmus: Normal in size measuring 0.6 cm in diameter, unchanged Right lobe: Normal in size measuring 5.3 x 1.4 x 1.6 cm, previously, 4.1 x 1.2 x 1.5 cm Left lobe: Enlarged measuring 6.9 x 3.9 x  2.4 cm, previously, 6.1 x 2.1 x 2.3 cm. ___________________________________________________________  The previously biopsied approximately 3.0 x 2.7 x 1.9 cm isoechoic ill-defined nodule within the mid/inferior aspect of the left lobe of the thyroid is grossly unchanged in size compared to the 10/2017 examination, previously, 3.2 x 2.5 x 2.0 cm. Correlation with previous biopsy results is advised.  IMPRESSION: 1. Similar appearing mildly heterogeneous and borderline enlarged thyroid gland without worrisome new or enlarging thyroid nodule. 2. Previously biopsied nodule  within the mid/inferior aspect the left lobe of the thyroid is grossly unchanged compared to the 10/2017 examination. Correlation with previous biopsy results is advised. Assuming a benign pathologic diagnosis, repeat sampling and/or continued dedicated follow-up is not recommend  Her Graves' antibodies were elevated: Lab Results  Component Value Date   TSI 376 (H) 04/26/2020   TSI 331 (H) 11/03/2019   TSI 395 (H) 04/06/2019   TSI 361 (H) 02/06/2018   Pt denies: - feeling nodules in neck - hoarseness - dysphagia - choking - SOB with lying down  No FH of thyroid disease or cancer. No h/o radiation tx to head or neck.  No herbal supplements. No Biotin use. No recent steroids use.  Previously on biotin, stopped before last visit. In the past she got steroid courses for her back.  She also has a history of type 2 diabetes. She has asthma, on 2 inhalers. Sees Dr. Neldon Mc.  ROS: Constitutional: no weight gain/+ weight loss, no fatigue, no subjective hyperthermia, no subjective hypothermia Eyes: no blurry vision, no xerophthalmia ENT: no sore throat, + see HPI Cardiovascular: no CP/no SOB/no palpitations/no leg swelling Respiratory: no cough/no SOB/no wheezing Gastrointestinal: no N/no V/no D/no C/no acid reflux Musculoskeletal: + muscle aches/+ joint aches Skin: no rashes, no hair loss, + itching Neurological: no tremors/no numbness/no tingling/no dizziness  I reviewed pt's medications, allergies, PMH, social hx, family hx, and changes were documented in the history of present illness. Otherwise, unchanged from my initial visit note.  Past Medical History:  Diagnosis Date  . Allergic rhinitis   . Allergy   . Arthritis   . Asthma   . Chronic headache   . Diabetes mellitus   . DVT (deep venous thrombosis) ([place])   . Dyspnea   . Heart murmur   . Hypertension   . Penicillin allergy 01/19/2020  . Pulmonary embolism ([place])   . Pulmonary hypertension ([place])   . Seizures ([place])     Past Surgical History:  Procedure Laterality Date  . ABDOMINAL HYSTERECTOMY     partial, has ovaries  . CARDIAC CATHETERIZATION  12/28/2010   Mod. pulmonary hypertension, normal coronary arteries  . CARDIOVERSION N/A 11/23/2020   Procedure: CARDIOVERSION;  Surgeon: Sanda Klein, MD;  Location: [facility] ENDOSCOPY;  Service: Cardiovascular;  Laterality: N/A;  . DOPPLER ECHOCARDIOGRAPHY  10/08/2011   EF=>55%,mild asymmetric LVH, mod. TR, mod. PH, mild to mod LA dilatation  . IR LUMBAR DISC ASPIRATION W/IMG GUIDE  01/12/2020  . KNEE ARTHROSCOPY Left   . KNEE SURGERY    . Nuclear Stress Test  05/20/2006   No ischemia  . PARTIAL HYSTERECTOMY    . PLANTAR FASCIA SURGERY    . TONSILLECTOMY     Social History   Socioeconomic History  . Marital status: Widowed    Spouse name: Not on file  . Number of children: Y  . Years of education: Not on file  . Highest education level: Not on file  Occupational History  . Occupation: retired Geologist, engineering.  Tobacco Use  . Smoking status: Never Smoker  . Smokeless tobacco: Never Used  Vaping Use  . Vaping Use: Never used  Substance and Sexual Activity  . Alcohol use: Not Currently    Alcohol/week: 0.0 standard drinks    Comment: occ glass on wine  . Drug use: No  . Sexual activity: Never  Other Topics Concern  . Not on file  Social History Narrative   Widow    limited exercise.   Social Determinants of Health   Financial Resource Strain: Not on file  Food Insecurity: Not on file  Transportation Needs: Not on file  Physical Activity: Not on file  Stress: Not on file  Social Connections: Not on file  Intimate Partner Violence: Not on file   Current Outpatient Medications on File Prior to Visit  Medication Sig Dispense Refill  . acetaminophen (TYLENOL) 650 MG CR tablet Take 1,300 mg by mouth daily as needed for pain.    . Acetaminophen-Codeine 300-30 MG tablet Take 1 tablet by mouth every 6 (six) hours as needed for pain.    Marland Kitchen ADVAIR  HFA 230-21 MCG/ACT inhaler INHALE 2 PUFFS BY MOUTH TWICE DAILY TO PREVENT COUGH OR WHEEZING, RINSE MOUTH AFTER USE 12 g 4  . albuterol (VENTOLIN HFA) 108 (90 Base) MCG/ACT inhaler INHALE 2 PUFFS BY MOUTH INTO THE LUNGS EVERY 6 HOURS AS NEEDED FOR WHEEZING OR SHORTNESS OF BREATH 54 g 0  . atorvastatin (LIPITOR) 10 MG tablet TAKE 1 TABLET(10 MG) BY MOUTH DAILY 90 tablet 1  . Blood Glucose Monitoring Suppl (ONETOUCH VERIO) w/Device KIT Use to check blood sugar up to 2 times a day 1 kit 0  . Coenzyme Q10 (COQ-10) 100 MG CAPS Take 100 mg by mouth daily.    . cyclobenzaprine (FLEXERIL) 10 MG tablet TAKE 1 TABLET(10 MG) BY MOUTH AT BEDTIME AS NEEDED FOR MUSCLE SPASMS 30 tablet 1  . dextromethorphan-guaiFENesin (MUCINEX DM) 30-600 MG 12hr tablet Take 1 tablet by mouth 2 (two) times daily.    Marland Kitchen DM-APAP-CPM (CORICIDIN HBP PO) Take 1 tablet by mouth daily as needed (cough/cold).    . EPINEPHrine 0.3 mg/0.3 mL IJ SOAJ injection USE AS DIRECTED FOR LIFE THREATENING ALLERGIC REACTIONS 2 each 2  . famotidine (PEPCID) 40 MG tablet TAKE 1 TABLET(40 MG) BY MOUTH DAILY 30 tablet 3  . Ferrous Sulfate (IRON) 325 (65 Fe) MG TABS Take 325 mg by mouth daily.    . fluticasone (FLOVENT HFA) 220 MCG/ACT inhaler INHALE 2 PUFFS BY MOUTH TWICE DAILY DURING FLARE UP AS NEEDED. RINSE, GARGLE AND SPIT AFTER USE 12 g 1  . furosemide (LASIX) 20 MG tablet Take 1 tablet (20 mg total) by mouth daily. 90 tablet 3  . gabapentin (NEURONTIN) 600 MG tablet TAKE 1 TABLET BY MOUTH DAILY AT BREAKFAST, 1 TABLET AT LUNCH, AND 2 TABLETS AT BEDTIME 360 tablet 1  . GARLIC PO Take 1 tablet by mouth daily.    Marland Kitchen GLYXAMBI 10-5 MG TABS TAKE 1 TABLET BY MOUTH DAILY 30 tablet 11  . Homeopathic Products [facility] RELIEF EX) Apply 1 spray topically daily as needed (muscle cramps).    . hydrOXYzine (ATARAX/VISTARIL) 10 MG tablet Take 1 tablet (10 mg total) by mouth daily as needed for itching. 15 tablet 0  . ipratropium (ATROVENT) 0.06 % nasal spray USE 1  TO 2 SPRAYS IN EACH NOSTRIL 1 TO 2 TIMES PER DAY 15 mL 3  . irbesartan (AVAPRO) 150 MG tablet TAKE 1 TABLET(150 MG) BY MOUTH DAILY  30 tablet 8  . Lactase (DAIRY-RELIEF PO) Take 1 capsule by mouth daily as needed (eating dairy).     . Lancets (ONETOUCH DELICA PLUS [id number]) MISC USE TO CHECK BLOOD SUGAR UP TO TWICE DAILY 100 each 5  . levalbuterol (XOPENEX) 1.25 MG/3ML nebulizer solution USE 1 VIAL VIA NEBULIZER EVERY 6 HOURS AS NEEDED FOR WHEEZING OR SHORTNESS OF BREATH 9 mL 1  . liver oil-zinc oxide (DESITIN) 40 % ointment Apply 1 application topically as needed for irritation.    . meclizine (ANTIVERT) 12.5 MG tablet Take 1 tablet (12.5 mg total) by mouth 3 (three) times daily as needed for dizziness. 15 tablet 0  . Menthol-Methyl Salicylate (SALONPAS JET SPRAY EX) Apply 1 spray topically daily as needed (knee pain).    . methimazole (TAPAZOLE) 5 MG tablet Take 2.5 mg by mouth every other day.    . Misc Natural Products (NEURIVA PO) Take 2 tablets by mouth daily.    . montelukast (SINGULAIR) 10 MG tablet Take 10 mg by mouth at bedtime.    Marland Kitchen omalizumab (XOLAIR) 150 MG injection Inject 300 mg into the skin every 28 (twenty-eight) days.    . ondansetron (ZOFRAN-ODT) 4 MG disintegrating tablet Take 4 mg by mouth every 8 (eight) hours as needed for nausea or vomiting.     [name] VERIO test strip USE TO CHECK BLOOD SUGAR UP TO TWICE DAILY 100 strip 5  . OVER THE COUNTER MEDICATION Take 1 capsule by mouth daily. Bladder wak    . OVER THE COUNTER MEDICATION Take 1 capsule by mouth daily. [employer]    . OVER THE COUNTER MEDICATION Take 1 tablet by mouth daily. 7 support joint and comfort    . pantoprazole (PROTONIX) 40 MG tablet TAKE 1 TABLET(40 MG) BY MOUTH DAILY 90 tablet 1  . PATADAY 0.2 % SOLN Place 1 drop into both eyes daily as needed (allergies).   4  . Polyethyl Glycol-Propyl Glycol (SYSTANE OP) Place 1 drop into both eyes in the morning and at bedtime.    . potassium chloride SA  (KLOR-CON) 20 MEQ tablet Take 1 tablet (20 mEq total) by mouth 2 (two) times daily. 180 tablet 3  . Pumpkin Seed-Soy Germ (AZO BLADDER CONTROL/GO-LESS PO) Take 1 tablet by mouth in the morning and at bedtime.    . Tiotropium Bromide Monohydrate (SPIRIVA RESPIMAT) 1.25 MCG/ACT AERS Inhale 2 puffs into the lungs daily. 4 g 5  . TRULANCE 3 MG TABS Take 3 mg by mouth every other day.    . Turmeric 500 MG CAPS Take 1,000 mg by mouth daily.    Alveda Reasons 20 MG TABS tablet TAKE 1 TABLET(20 MG) BY MOUTH DAILY WITH SUPPER 90 tablet 1   Current Facility-Administered Medications on File Prior to Visit  Medication Dose Route Frequency Provider Last Rate Last Admin  . omalizumab Arvid Right) injection 300 mg  300 mg Subcutaneous Q28 days [name], MD   300 mg at 12/06/20 1109   Allergies  Allergen Reactions  . Latex Hives  . Penicillins Swelling    Has patient had a PCN reaction causing immediate rash, facial/tongue/throat swelling, SOB or lightheadedness with hypotension patient had a PCN reaction causing severe rash involving mucus membranes or skin necrosis: [id number]} Has patient had a PCN reaction that required hospitalization/No Has patient had a PCN reaction occurring within the last 10 years: No If all of the above answers are "NO", then may proceed with Cephalosporin use.     Marland Kitchen  Oxycodone-Acetaminophen Nausea And Vomiting  . Lyrica [Pregabalin] Other (See Comments)    Blurred vision.  . Nickel Rash  . Phenytoin Sodium Extended Swelling  . Vicodin [Hydrocodone-Acetaminophen] Nausea And Vomiting   Family History  Problem Relation Age of Onset  . Hypertension Mother   . Clotting disorder Mother   . Breast cancer Mother   . Arthritis Mother   . Stroke Mother   . Diabetes Mother   . Cancer Brother   . Alcohol abuse Father   . Arthritis Sister   . Diabetes Sister   . Multiple sclerosis Sister   . Allergies Other        grandson  . Allergic rhinitis Neg Hx   . Angioedema Neg Hx    . Asthma Neg Hx   . Atopy Neg Hx   . Eczema Neg Hx   . Immunodeficiency Neg Hx   . Urticaria Neg Hx     PE: BP 110/68 (BP Location: Right Arm, Patient Position: Sitting, Cuff Size: Normal)   Pulse (!) 52   Ht 5' 3.5" (1.613 m)   Wt 200 lb 12.8 oz (91.1 kg)   SpO2 97%   BMI 35.01 kg/m  Wt Readings from Last 3 Encounters:  12/15/20 200 lb 12.8 oz (91.1 kg)  11/25/20 205 lb (93 kg)  10/26/20 199 lb 3.2 oz (90.4 kg)   Constitutional: overweight, in NAD Eyes: PERRLA, EOMI, no exophthalmos ENT: moist mucous membranes, no thyromegaly, no cervical lymphadenopathy Cardiovascular: RRR, No MRG Respiratory: CTA B Gastrointestinal: abdomen soft, NT, ND, BS+ Musculoskeletal: no deformities, strength intact in all 4 Skin: moist, warm, no rashes Neurological: no tremor with outstretched hands, DTR normal in all 4  ASSESSMENT: 1.  Graves' disease  2.  Left thyroid nodule  PLAN:  1. Patient with history of thyrotoxicosis consistent with Graves' disease.  She initially had the following thyrotoxic symptoms: Fatigue, heat intolerance, insomnia, but no palpitations, anxiety, hyper defecation.  The thyroid uptake and scan confirmed Graves' disease. TSI antibodies were high.  She was started on methimazole, and we were able to decrease the dose to 2.5 mg daily in 09/2018.  In 04/2020, she was feeling well, and her TFTs were normal, so I advised her to stop the methimazole.  However, afterwards, we had to restart methimazole in 05/2020.  I advised her to start at 2.5 mg daily, but she misunderstood instructions and started at 5 mg.  In 10/2020, I advised her to decrease the dose to 2.5 mg daily.  In 11/2020, we again discussed about stopping methimazole as TSH was higher than 3.  However, she tells me that she is actually taking 2.5 mg every other day... -She denies thyrotoxic symptoms.  She did lose weight, but this was intentional. -Of note, latest TSI antibodies were elevated in 04/2020.  We will  not repeat them today -At today's visit, we will recheck her TFTs: TSH, free T4, free T3 -We did discuss that if the TSH is higher in the normal range, will most likely need to stop her methimazole. - I will see her back in a year  2.  Left thyroid nodule -This appears to be cold on her thyroid uptake and scan -A Biopsy of this nodule obtained in 11/2017 was benign; I also reviewed the thyroid ultrasound obtained before last visit: Nodule was stable in size and no follow-up was recommended -No neck compression symptoms except for occasional choking when food gets stuck in her throat-this is chronic -We will continue to  monitor her clinically  Component     Latest Ref Rng & Units 12/15/2020  TSH     0.35 - 4.50 uIU/mL 4.07  T4,Free(Direct)     0.60 - 1.60 ng/dL 1.11  Triiodothyronine,Free,Serum     2.3 - 4.2 pg/mL 2.6  TFTs show a TSH high in the normal range.  We will stop methimazole and have her back for a recheck in 4 to 5 weeks.  Addendum: After the above results returned, we called her with the above plan.  However, she mentioned that she was in fact not taking the methimazole 2.5 mg every other day, but every day.  In this case, we will switch it to every other day.  Philemon Kingdom, MD PhD [facility]

## 2020-12-16 ENCOUNTER — Encounter: Payer: Self-pay | Admitting: Internal Medicine

## 2020-12-16 ENCOUNTER — Other Ambulatory Visit: Payer: Self-pay

## 2020-12-16 DIAGNOSIS — L299 Pruritus, unspecified: Secondary | ICD-10-CM

## 2020-12-16 NOTE — Telephone Encounter (Signed)
Patient called in requesting medication to help with her itchiness, patient also asked what could cause her to get cramps in her legs. Please advise on proper steps to take.

## 2020-12-16 NOTE — Telephone Encounter (Signed)
She can use hydroxyzine for itching. Does she have? If not okay to refill # 30 2 RF  Most common cause of cramps.. dehydration and low potassium...keep up with water intake. Her last potassium was normal. Also could be side effect to atorvastatin.. can try to hold for 1 week.. if symptoms resolve she should restart  it and take co enzyme Q 10 with it which helps with leg ache side effect.  Lab Results  Component Value Date   NA 140 11/21/2020   K 4.8 11/21/2020   CO2 22 11/21/2020   GLUCOSE 116 (H) 11/21/2020   BUN 16 11/21/2020   CREATININE 1.09 (H) 11/21/2020   CALCIUM 9.2 11/21/2020   GFRNONAA >60 10/10/2020   GFRAA 62 01/04/2020

## 2020-12-17 ENCOUNTER — Other Ambulatory Visit: Payer: Self-pay | Admitting: Allergy and Immunology

## 2020-12-19 ENCOUNTER — Telehealth: Payer: Self-pay | Admitting: Internal Medicine

## 2020-12-19 MED ORDER — HYDROXYZINE HCL 10 MG PO TABS: 10.0000 mg | ORAL_TABLET | Freq: Every day | ORAL | 2 refills | Status: DC | PRN

## 2020-12-19 MED ORDER — METHIMAZOLE 5 MG PO TABS: 2.5000 mg | ORAL_TABLET | ORAL | 3 refills | Status: DC

## 2020-12-19 NOTE — Telephone Encounter (Signed)
Called and spoke with pt to discuss results. Pt advised she was previously taking 1/2 tablet of Methimazole every day not every other day. Wanted to know if she should stop methimazole or switch to every other day?

## 2020-12-19 NOTE — Telephone Encounter (Signed)
In that case, lets have her take it every other day and repeat the labs as discussed in 5 to 6 weeks.

## 2020-12-19 NOTE — Telephone Encounter (Signed)
Patient states she is returning Patricia's call from 12/17/20 and requests to be called at ph# [phone]

## 2020-12-19 NOTE — Addendum Note (Signed)
Addended by: Philemon Kingdom on: 12/19/2020 10:27 AM   Modules accepted: Orders

## 2020-12-19 NOTE — Telephone Encounter (Signed)
[name] notified as instructed by telephone.  Hydroxyzine refill sent to [facility] as instructed by Dr. Diona Browner.  She states she is drinking 5 to 6 bottles of water a day and taking her potassium.  She will try holding her atorvastatin for a week to see if the leg cramps get better.

## 2020-12-20 NOTE — Telephone Encounter (Signed)
Pt advised and verbalized understanding.

## 2020-12-22 ENCOUNTER — Telehealth: Payer: Self-pay | Admitting: Adult Health

## 2020-12-22 ENCOUNTER — Ambulatory Visit (INDEPENDENT_AMBULATORY_CARE_PROVIDER_SITE_OTHER): Payer: Medicare Other | Admitting: Cardiovascular Disease

## 2020-12-22 ENCOUNTER — Encounter: Payer: Self-pay | Admitting: Adult Health

## 2020-12-22 ENCOUNTER — Ambulatory Visit (INDEPENDENT_AMBULATORY_CARE_PROVIDER_SITE_OTHER): Payer: Medicare Other | Admitting: Adult Health

## 2020-12-22 ENCOUNTER — Other Ambulatory Visit: Payer: Self-pay

## 2020-12-22 ENCOUNTER — Encounter: Payer: Self-pay | Admitting: Cardiovascular Disease

## 2020-12-22 VITALS — BP 120/72 | HR 60 | Ht 64.0 in | Wt 200.0 lb

## 2020-12-22 VITALS — BP 102/76 | HR 57 | Ht 64.0 in | Wt 201.0 lb

## 2020-12-22 DIAGNOSIS — I5033 Acute on chronic diastolic (congestive) heart failure: Secondary | ICD-10-CM

## 2020-12-22 DIAGNOSIS — Z9989 Dependence on other enabling machines and devices: Secondary | ICD-10-CM | POA: Diagnosis not present

## 2020-12-22 DIAGNOSIS — E663 Overweight: Secondary | ICD-10-CM | POA: Diagnosis not present

## 2020-12-22 DIAGNOSIS — I2721 Secondary pulmonary arterial hypertension: Secondary | ICD-10-CM | POA: Diagnosis not present

## 2020-12-22 DIAGNOSIS — E7800 Pure hypercholesterolemia, unspecified: Secondary | ICD-10-CM | POA: Diagnosis not present

## 2020-12-22 DIAGNOSIS — J4540 Moderate persistent asthma, uncomplicated: Secondary | ICD-10-CM

## 2020-12-22 DIAGNOSIS — I10 Essential (primary) hypertension: Secondary | ICD-10-CM | POA: Diagnosis not present

## 2020-12-22 DIAGNOSIS — G4733 Obstructive sleep apnea (adult) (pediatric): Secondary | ICD-10-CM

## 2020-12-22 DIAGNOSIS — R072 Precordial pain: Secondary | ICD-10-CM | POA: Diagnosis not present

## 2020-12-22 DIAGNOSIS — E0590 Thyrotoxicosis, unspecified without thyrotoxic crisis or storm: Secondary | ICD-10-CM | POA: Diagnosis not present

## 2020-12-22 DIAGNOSIS — E1140 Type 2 diabetes mellitus with diabetic neuropathy, unspecified: Secondary | ICD-10-CM | POA: Diagnosis not present

## 2020-12-22 DIAGNOSIS — Z7901 Long term (current) use of anticoagulants: Secondary | ICD-10-CM | POA: Diagnosis not present

## 2020-12-22 DIAGNOSIS — I480 Paroxysmal atrial fibrillation: Secondary | ICD-10-CM | POA: Diagnosis not present

## 2020-12-22 DIAGNOSIS — Z86711 Personal history of pulmonary embolism: Secondary | ICD-10-CM | POA: Diagnosis not present

## 2020-12-22 MED ORDER — FUROSEMIDE 20 MG PO TABS: 20.0000 mg | ORAL_TABLET | ORAL | 3 refills | Status: DC | PRN

## 2020-12-22 MED ORDER — POTASSIUM CHLORIDE CRYS ER 20 MEQ PO TBCR: 20.0000 meq | EXTENDED_RELEASE_TABLET | ORAL | 0 refills | Status: DC | PRN

## 2020-12-22 NOTE — Telephone Encounter (Signed)
Pt called, do I need to keep my appt scheduled 04/25/21 at 11:30a. Would like a call from the nurse.

## 2020-12-22 NOTE — Patient Instructions (Signed)
Continue using CPAP nightly and greater than 4 hours each night °If your symptoms worsen or you develop new symptoms please let us know.  ° °

## 2020-12-22 NOTE — Telephone Encounter (Signed)
I called patient canceled her October appointment since she was seen today for CPAP we will see her back next year June confirmed her date and time of her appointment she verbalized understanding.

## 2020-12-22 NOTE — Patient Instructions (Signed)
Medication Instructions:  Furosemide: take one tablet as needed Potassium: take one tablet as needed  *If you need a refill on your cardiac medications before your next appointment, please call your pharmacy*   Lab Work: None ordered If you have labs (blood work) drawn today and your tests are completely normal, you will receive your results only by: [place] (if you have MyChart) OR A paper copy in the mail If you have any lab test that is abnormal or we need to change your treatment, we will call you to review the results.   Testing/Procedures: None ordered   Follow-Up: At [facility], you and your health needs are our priority.  As part of our continuing mission to provide you with exceptional heart care, we have created designated Provider Care Teams.  These Care Teams include your primary Cardiologist (physician) and Advanced Practice Providers (APPs -  Physician Assistants and Nurse Practitioners) who all work together to provide you with the care you need, when you need it.  We recommend signing up for the patient portal called "MyChart".  Sign up information is provided on this After Visit Summary.  MyChart is used to connect with patients for Virtual Visits (Telemedicine).  Patients are able to view lab/test results, encounter notes, upcoming appointments, etc.  Non-urgent messages can be sent to your provider as well.   To learn more about what you can do with MyChart, go to [email or web address].    Your next appointment:   6 month(s)  The format for your next appointment:   In Person  Provider:   You may see Sanda Klein, MD or one of the following Advanced Practice Providers on your designated Care Team:   [name], PA-C Fabian Sharp, PA-C or  Roby Lofts, [place]

## 2020-12-22 NOTE — Progress Notes (Signed)
Cardiology Office Note    Date:  12/23/2020   ID:  Alicia, Price 1945-08-08, MRN [id number]  PCP:  [name], MD  Cardiologist:   Sanda Klein, MD   Chief complaint: Post-cardioversion for atrial flutter   History of Present Illness:  [name] is a 75 y.o. female with recent atrial flutter s/p DCCV 11/23/2020, paroxysmal atrial fibrillation, remote DVT/PE, moderate pulmonary hypertension, chronic reactive airway disease and degenerative joint disease returning for follow-up.  She returns in follow-up after undergoing elective cardioversion 11/23/2020.  She remains in normal rhythm today.  She has noticed significant improvement in her energy level now that she is in normal rhythm.  She denies palpitations, dizziness, syncope, angina or dyspnea at rest, orthopnea, PND.  Activity is limited by knee pain.  She is scheduled to undergo right total knee replacement at [facility] in September.  Her weight continues to decrease and is now down to 197 pounds on her home scale (3 pounds higher on our office scale).  She is lost 58 pounds in the last few months.  She reports compliant with Xarelto and has not had any bleeding problems.  She has not had falls.  Previous coronary angiography showed no evidence of CAD but she did have moderate pulmonary artery hypertension by right heart catheterization (systolic PAP 50 mmHg).  She has had a variety of noncardiac issues including recurrent issues with diarrhea (she is seeing Dr. Cristina Gong) and continued hyperthyroidism controlled on methimazole (she is seeing Dr. Cruzita Lederer).    She continues to have issues with thoracic spine related pain (Dr. Gladstone Lighter). She has had a previous equivocal workup for hypercoagulable conditions (lupus anticoagulant positive on one of 2 separate assays, protein S activity decreased with normal total protein S level).    Past Medical History:  Diagnosis Date   Allergic rhinitis    Allergy    Arthritis     Asthma    Chronic headache    Diabetes mellitus    DVT (deep venous thrombosis) (HCC)    Dyspnea    Heart murmur    Hypertension    Penicillin allergy 01/19/2020   Pulmonary embolism (HCC)    Pulmonary hypertension (HCC)    Seizures ([place])     Past Surgical History:  Procedure Laterality Date   ABDOMINAL HYSTERECTOMY     partial, has ovaries   CARDIAC CATHETERIZATION  12/28/2010   Mod. pulmonary hypertension, normal coronary arteries   CARDIOVERSION N/A 11/23/2020   Procedure: CARDIOVERSION;  Surgeon: Sanda Klein, MD;  Location: [place];  Service: Cardiovascular;  Laterality: N/A;   DOPPLER ECHOCARDIOGRAPHY  10/08/2011   EF=>55%,mild asymmetric LVH, mod. TR, mod. PH, mild to mod LA dilatation   IR LUMBAR DISC ASPIRATION W/IMG GUIDE  01/12/2020   KNEE ARTHROSCOPY Left    KNEE SURGERY     Nuclear Stress Test  05/20/2006   No ischemia   PARTIAL HYSTERECTOMY     PLANTAR FASCIA SURGERY     TONSILLECTOMY      Current Medications: Outpatient Medications Prior to Visit  Medication Sig Dispense Refill   acetaminophen (TYLENOL) 650 MG CR tablet Take 1,300 mg by mouth daily as needed for pain.     Acetaminophen-Codeine 300-30 MG tablet Take 1 tablet by mouth every 6 (six) hours as needed for pain.     ADVAIR HFA 230-21 MCG/ACT inhaler INHALE 2 PUFFS BY MOUTH TWICE DAILY TO PREVENT COUGH OR WHEEZING, RINSE MOUTH AFTER USE 12 g 4   albuterol (  VENTOLIN HFA) 108 (90 Base) MCG/ACT inhaler INHALE 2 PUFFS BY MOUTH EVERY 6 HOURS AS NEEDED FOR WHEEZING OR SHORTNESS OF BREATH 54 g 0   atorvastatin (LIPITOR) 10 MG tablet TAKE 1 TABLET(10 MG) BY MOUTH DAILY 90 tablet 1   Blood Glucose Monitoring Suppl (ONETOUCH VERIO) w/Device KIT Use to check blood sugar up to 2 times a day 1 kit 0   Coenzyme Q10 (COQ-10) 100 MG CAPS Take 100 mg by mouth daily.     cyclobenzaprine (FLEXERIL) 10 MG tablet TAKE 1 TABLET(10 MG) BY MOUTH AT BEDTIME AS NEEDED FOR MUSCLE SPASMS 30 tablet 1    dextromethorphan-guaiFENesin (MUCINEX DM) 30-600 MG 12hr tablet Take 1 tablet by mouth 2 (two) times daily.     DM-APAP-CPM (CORICIDIN HBP PO) Take 1 tablet by mouth daily as needed (cough/cold).     EPINEPHrine 0.3 mg/0.3 mL IJ SOAJ injection USE AS DIRECTED FOR LIFE THREATENING ALLERGIC REACTIONS 2 each 2   famotidine (PEPCID) 40 MG tablet TAKE 1 TABLET(40 MG) BY MOUTH DAILY 30 tablet 3   Ferrous Sulfate (IRON) 325 (65 Fe) MG TABS Take 325 mg by mouth daily.     fluticasone (FLOVENT HFA) 220 MCG/ACT inhaler INHALE 2 PUFFS BY MOUTH TWICE DAILY DURING FLARE UP AS NEEDED. RINSE, GARGLE AND SPIT AFTER USE 12 g 1   gabapentin (NEURONTIN) 600 MG tablet TAKE 1 TABLET BY MOUTH DAILY AT BREAKFAST, 1 TABLET AT LUNCH, AND 2 TABLETS AT BEDTIME 119 tablet 1   GARLIC PO Take 1 tablet by mouth daily.     GLYXAMBI 10-5 MG TABS TAKE 1 TABLET BY MOUTH DAILY 30 tablet 11   Homeopathic Products (THERAWORX RELIEF EX) Apply 1 spray topically daily as needed (muscle cramps).     hydrOXYzine (ATARAX/VISTARIL) 10 MG tablet Take 1 tablet (10 mg total) by mouth daily as needed for itching. 30 tablet 2   ipratropium (ATROVENT) 0.06 % nasal spray USE 1 TO 2 SPRAYS IN EACH NOSTRIL 1 TO 2 TIMES PER DAY 15 mL 3   irbesartan (AVAPRO) 150 MG tablet TAKE 1 TABLET(150 MG) BY MOUTH DAILY 30 tablet 8   Lactase (DAIRY-RELIEF PO) Take 1 capsule by mouth daily as needed (eating dairy).      Lancets (ONETOUCH DELICA PLUS [id number]) MISC USE TO CHECK BLOOD SUGAR UP TO TWICE DAILY 100 each 5   levalbuterol (XOPENEX) 1.25 MG/3ML nebulizer solution USE 1 VIAL VIA NEBULIZER EVERY 6 HOURS AS NEEDED FOR SHORTNESS OF BREATH OR WHEEZING 900 mL 1   liver oil-zinc oxide (DESITIN) 40 % ointment Apply 1 application topically as needed for irritation.     meclizine (ANTIVERT) 12.5 MG tablet Take 1 tablet (12.5 mg total) by mouth 3 (three) times daily as needed for dizziness. 15 tablet 0   Menthol-Methyl Salicylate (SALONPAS JET SPRAY EX) Apply 1 spray  topically daily as needed (knee pain).     methimazole (TAPAZOLE) 5 MG tablet Take 0.5 tablets (2.5 mg total) by mouth every other day. 45 tablet 3   Misc Natural Products (NEURIVA PO) Take 2 tablets by mouth daily.     montelukast (SINGULAIR) 10 MG tablet Take 10 mg by mouth at bedtime.     omalizumab (XOLAIR) 150 MG injection Inject 300 mg into the skin every 28 (twenty-eight) days.     ondansetron (ZOFRAN-ODT) 4 MG disintegrating tablet Take 4 mg by mouth every 8 (eight) hours as needed for nausea or vomiting.      ONETOUCH VERIO test strip USE TO  CHECK BLOOD SUGAR UP TO TWICE DAILY 100 strip 5   OVER THE COUNTER MEDICATION Take 1 capsule by mouth daily. Bladder wak     OVER THE COUNTER MEDICATION Take 1 capsule by mouth daily. [employer]     OVER THE COUNTER MEDICATION Take 1 tablet by mouth daily. 7 support joint and comfort     pantoprazole (PROTONIX) 40 MG tablet TAKE 1 TABLET(40 MG) BY MOUTH DAILY 90 tablet 1   PATADAY 0.2 % SOLN Place 1 drop into both eyes daily as needed (allergies).   4   Polyethyl Glycol-Propyl Glycol (SYSTANE OP) Place 1 drop into both eyes in the morning and at bedtime.     Pumpkin Seed-Soy Germ (AZO BLADDER CONTROL/GO-LESS PO) Take 1 tablet by mouth in the morning and at bedtime.     Tiotropium Bromide Monohydrate (SPIRIVA RESPIMAT) 1.25 MCG/ACT AERS Inhale 2 puffs into the lungs daily. 4 g 5   TRULANCE 3 MG TABS Take 3 mg by mouth every other day.     Turmeric 500 MG CAPS Take 1,000 mg by mouth daily.     XARELTO 20 MG TABS tablet TAKE 1 TABLET(20 MG) BY MOUTH DAILY WITH SUPPER 90 tablet 1   furosemide (LASIX) 20 MG tablet Take 1 tablet (20 mg total) by mouth daily. 90 tablet 3   potassium chloride SA (KLOR-CON) 20 MEQ tablet Take 1 tablet (20 mEq total) by mouth 2 (two) times daily. 180 tablet 3   Facility-Administered Medications Prior to Visit  Medication Dose Route Frequency Provider Last Rate Last Admin   omalizumab Arvid Right) injection 300 mg  300 mg  Subcutaneous Q28 days Kozlow, Donnamarie Poag, MD   300 mg at 12/06/20 1109     Allergies:   Latex, Penicillins, Oxycodone-acetaminophen, Lyrica [pregabalin], Nickel, Phenytoin sodium extended, and Vicodin [hydrocodone-acetaminophen]   Social History   Socioeconomic History   Marital status: Widowed    Spouse name: Not on file   Number of children: Y   Years of education: Not on file   Highest education level: Not on file  Occupational History   Occupation: retired Geologist, engineering.   Tobacco Use   Smoking status: Never   Smokeless tobacco: Never  Vaping Use   Vaping Use: Never used  Substance and Sexual Activity   Alcohol use: Not Currently    Alcohol/week: 0.0 standard drinks    Comment: occ glass on wine   Drug use: No   Sexual activity: Never  Other Topics Concern   Not on file  Social History Narrative   Widow    limited exercise.   Social Determinants of Health   Financial Resource Strain: Not on file  Food Insecurity: Not on file  Transportation Needs: Not on file  Physical Activity: Not on file  Stress: Not on file  Social Connections: Not on file     Family History:  The patient's family history includes Alcohol abuse in her father; Allergies in an other family member; Arthritis in her mother and sister; Breast cancer in her mother; Cancer in her brother; Clotting disorder in her mother; Diabetes in her mother and sister; Hypertension in her mother; Multiple sclerosis in her sister; Stroke in her mother.   ROS:   Please see the history of present illness.    ROS All other systems reviewed and are negative.   PHYSICAL EXAM:   VS:  BP 120/72   Pulse 60   Ht _0  (1.626 m)   Wt 200 lb (  90.7 kg)   SpO2 96%   BMI 34.33 kg/m      General: Alert, oriented x3, no distress, overweight, no longer obese Head: no evidence of trauma, PERRL, EOMI, no exophtalmos or lid lag, no myxedema, no xanthelasma; normal ears, nose and oropharynx Neck: normal jugular venous  pulsations and no hepatojugular reflux; brisk carotid pulses without delay and no carotid bruits Chest: clear to auscultation, no signs of consolidation by percussion or palpation, normal fremitus, symmetrical and full respiratory excursions Cardiovascular: normal position and quality of the apical impulse, regular rhythm, normal first and second heart sounds, no murmurs, rubs or gallops Abdomen: no tenderness or distention, no masses by palpation, no abnormal pulsatility or arterial bruits, normal bowel sounds, no hepatosplenomegaly Extremities: no clubbing, cyanosis or edema; 2+ radial, ulnar and brachial pulses bilaterally; 2+ right femoral, posterior tibial and dorsalis pedis pulses; 2+ left femoral, posterior tibial and dorsalis pedis pulses; no subclavian or femoral bruits Neurological: grossly nonfocal Psych: Normal mood and affect    Wt Readings from Last 3 Encounters:  12/22/20 201 lb (91.2 kg)  12/22/20 200 lb (90.7 kg)  12/15/20 200 lb 12.8 oz (91.1 kg)      Studies/Labs Reviewed:  Echocardiogram 11/07/2020   1. Rhythm appears to be atrial flutter with variable conduction. Left  ventricular ejection fraction, by estimation, is 60 to 65%. The left  ventricle has normal function. The left ventricle has no regional wall  motion abnormalities. There is moderate  asymmetric left ventricular hypertrophy of the basal-septal segment. Left  ventricular diastolic parameters are indeterminate.   2. Right ventricular systolic function is normal. The right ventricular  size is normal. There is mildly elevated pulmonary artery systolic  pressure. The estimated right ventricular systolic pressure is 47.0 mmHg.   3. Left atrial size was mildly dilated.   4. Right atrial size was moderately dilated.   5. The mitral valve is normal in structure. Trivial mitral valve  regurgitation. No evidence of mitral stenosis.   6. Tricuspid valve regurgitation is moderate.   7. The aortic valve is  tricuspid. Aortic valve regurgitation is not  visualized. No aortic stenosis is present.   8. The inferior vena cava is normal in size with greater than 50%  respiratory variability, suggesting right atrial pressure of 3 mmHg.   EKG:  EKG is not ordered today.  Personally reviewed: Normal sinus rhythm, incomplete right bundle branch block, QTC 392 ms. Recent Labs: 04/20/2020: ALT 7 10/26/2020: BNP 83.6 11/21/2020: BUN 16; Creatinine, Ser 1.09; Hemoglobin 13.4; Platelets 276; Potassium 4.8; Sodium 140 12/15/2020: TSH 4.07   Lipid Panel    Component Value Date/Time   CHOL 117 04/20/2020 1131   TRIG 71.0 04/20/2020 1131   HDL 46.60 04/20/2020 1131   CHOLHDL 3 04/20/2020 1131   VLDL 14.2 04/20/2020 1131   LDLCALC 56 04/20/2020 1131     ASSESSMENT:    1. Paroxysmal atrial fibrillation (HCC)   2. Acute on chronic diastolic congestive heart failure ([place])   3. Essential hypertension   4. Precordial pain   5. History of pulmonary embolism   6. PAH (pulmonary artery hypertension) ([place])   7. Type 2 diabetes mellitus with diabetic neuropathy, without long-term current use of insulin ([place])   8. Hypercholesterolemia   9. Overweight   10. Long term current use of anticoagulant   11. Asthma, moderate persistent   12. Hyperthyroidism      PLAN:  In order of problems listed above:  AFlutter: She was never  aware of palpitations.  Improved energy level now that she is back in normal rhythm..  CHADSVasc 4 (age, gender, HTN, DM, CHF).  Discussed options for future treatment with either antiarrhythmics or cavotricuspid isthmus ablation.  She does have a remote history of atrial fibrillation so CVI ablation would have limited benefit and would not allow discontinuation of anticoagulants. CHF: It appears that she had an episode of acute exacerbation of chronic diastolic heart failure due to persistent atrial flutter.  Significant improvement since the cardioversion.  We will change the loop diuretic  and potassium supplement to as needed only HTN: Very well controlled typically in the 100-110/60s.  As she is lost weight, we have gradually had to diminish antihypertensive medications.  Currently on a moderate dose of irbesartan alone, which can be continued for nephro protection. Chest pain: Resolved following the cardioversion.  The nuclear stress test recommended in April was never performed.  She does have coronary risk factors, but as long as she remains asymptomatic we will hold off on this.  She did have a low risk nuclear stress test in 9767 Hx DVT/PE: Conflicting results on previous hypercoagulable work-up, but this is probably a moot point since she will require chronic lifelong anticoagulation for atrial arrhythmia anyway. PAH: Mild, with estimated systolic PA pressure of 34LPFX, similar to 2016 when it was 46 mmHg.  The recent episode of heart failure exacerbation suggest that it is probably at least partly related to left ventricular diastolic dysfunction, but it may also be related to longstanding obesity and previous pulmonary embolism. DM: Excellent control.  She is taking an SGLT2 inhibitor.  Most recent hemoglobin A1c 6.2% HLP: All lipid parameters in desirable range on atorvastatin. Overweight: She is lost about 60 pounds in a relatively short period of time. Xarelto: Indicated both for atrial fibrillation and history of venous thromboembolism.  has not had bleeding complications. Asthma: Avoid nonselective beta-blockers.  Is quite possible that her recent episode of/asthma exacerbation" was actually cardiac asthma due to heart failure.  No wheezing at all today. Hyperthyroidism: On chronic methimazole, clinically euthyroid and recent TSH normal at 4.07   Medication Adjustments/Labs and Tests Ordered: Current medicines are reviewed at length with the patient today.  Concerns regarding medicines are outlined above.  Medication changes, Labs and Tests ordered today are listed in the  Patient Instructions below: Patient Instructions  Medication Instructions:  Furosemide: take one tablet as needed Potassium: take one tablet as needed  *If you need a refill on your cardiac medications before your next appointment, please call your pharmacy*   Lab Work: None ordered If you have labs (blood work) drawn today and your tests are completely normal, you will receive your results only by: MyChart Message (if you have MyChart) OR A paper copy in the mail If you have any lab test that is abnormal or we need to change your treatment, we will call you to review the results.   Testing/Procedures: None ordered   Follow-Up: At [facility], you and your health needs are our priority.  As part of our continuing mission to provide you with exceptional heart care, we have created designated Provider Care Teams.  These Care Teams include your primary Cardiologist (physician) and Advanced Practice Providers (APPs -  Physician Assistants and Nurse Practitioners) who all work together to provide you with the care you need, when you need it.  We recommend signing up for the patient portal called "MyChart".  Sign up information is provided on this After  Visit Summary.  MyChart is used to connect with patients for Virtual Visits (Telemedicine).  Patients are able to view lab/test results, encounter notes, upcoming appointments, etc.  Non-urgent messages can be sent to your provider as well.   To learn more about what you can do with MyChart, go to [email or web address].    Your next appointment:   6 month(s)  The format for your next appointment:   In Person  Provider:   You may see Sanda Klein, MD or one of the following Advanced Practice Providers on your designated Care Team:   [name], PA-C Fabian Sharp, [place] or  Roby Lofts, PA-C    Signed, Sanda Klein, MD  12/23/2020 2:35 PM    [address], [place], [place]  [address] Phone:  [phone]; Fax: [phone]

## 2020-12-22 NOTE — Progress Notes (Signed)
PATIENT: [name] DOB: 30-Dec-1945  REASON FOR VISIT: follow up HISTORY FROM: patient  HISTORY OF PRESENT ILLNESS: Today 12/22/20:  [name] is a 75 year old female with a history of obstructive sleep apnea on CPAP.  She reports that the CPAP is working well for her.  She denies any new issues.  She joins me today for follow-up.    04/13/20: [name] is a 75 year old female with a history of obstructive sleep apnea on CPAP.  Her download indicates that she used the machine 22 out of 30 days for compliance of 73%.  She used her machine greater than 4 hours 17 days for compliance of 57%.  On average she uses her machine 5 hours and 6 minutes.  Her residual AHI is 1.3 on 5 to 10 cm of water with EPR of 3.  Her leak in the 95th percentile is 31.6 L/min. She returns today for follow-up.  HISTORY 03/05/19:   [name] is a 75 year old female with a history of obstructive sleep apnea on CPAP.  Her download indicates that she used the machine 22 out of 30 days for compliance of 73%.  She used her machine greater than 4 hours 21 days for compliance of 70%.  On average she uses her machine 6 hours and 5 minutes.  Her residual AHI is 1.6 on 5 to 10 cm of water with EPR of 3.  Her leak in the 95th percentile is 29.7 L/min.  REVIEW OF SYSTEMS: Out of a complete 14 system review of symptoms, the patient complains only of the following symptoms, and all other reviewed systems are negative.  ESS 8  ALLERGIES: Allergies  Allergen Reactions   Latex Hives   Penicillins Swelling    Has patient had a PCN reaction causing immediate rash, facial/tongue/throat swelling, SOB or lightheadedness with hypotension patient had a PCN reaction causing severe rash involving mucus membranes or skin necrosis: [id number] Has patient had a PCN reaction that required hospitalization/No Has patient had a PCN reaction occurring within the last 10 years: No If all of the above answers are "NO", then may  proceed with Cephalosporin use.      Oxycodone-Acetaminophen Nausea And Vomiting   Lyrica [Pregabalin] Other (See Comments)    Blurred vision.   Nickel Rash   Phenytoin Sodium Extended Swelling   Vicodin [Hydrocodone-Acetaminophen] Nausea And Vomiting    HOME MEDICATIONS: Outpatient Medications Prior to Visit  Medication Sig Dispense Refill   acetaminophen (TYLENOL) 650 MG CR tablet Take 1,300 mg by mouth daily as needed for pain.     Acetaminophen-Codeine 300-30 MG tablet Take 1 tablet by mouth every 6 (six) hours as needed for pain.     ADVAIR HFA 230-21 MCG/ACT inhaler INHALE 2 PUFFS BY MOUTH TWICE DAILY TO PREVENT COUGH OR WHEEZING, RINSE MOUTH AFTER USE 12 g 4   albuterol (VENTOLIN HFA) 108 (90 Base) MCG/ACT inhaler INHALE 2 PUFFS BY MOUTH EVERY 6 HOURS AS NEEDED FOR WHEEZING OR SHORTNESS OF BREATH 54 g 0   atorvastatin (LIPITOR) 10 MG tablet TAKE 1 TABLET(10 MG) BY MOUTH DAILY 90 tablet 1   Blood Glucose Monitoring Suppl (ONETOUCH VERIO) w/Device KIT Use to check blood sugar up to 2 times a day 1 kit 0   Coenzyme Q10 (COQ-10) 100 MG CAPS Take 100 mg by mouth daily.     cyclobenzaprine (FLEXERIL) 10 MG tablet TAKE 1 TABLET(10 MG) BY MOUTH AT BEDTIME AS NEEDED FOR MUSCLE SPASMS 30 tablet 1   dextromethorphan-guaiFENesin (  MUCINEX DM) 30-600 MG 12hr tablet Take 1 tablet by mouth 2 (two) times daily.     DM-APAP-CPM (CORICIDIN HBP PO) Take 1 tablet by mouth daily as needed (cough/cold).     EPINEPHrine 0.3 mg/0.3 mL IJ SOAJ injection USE AS DIRECTED FOR LIFE THREATENING ALLERGIC REACTIONS 2 each 2   famotidine (PEPCID) 40 MG tablet TAKE 1 TABLET(40 MG) BY MOUTH DAILY 30 tablet 3   Ferrous Sulfate (IRON) 325 (65 Fe) MG TABS Take 325 mg by mouth daily.     fluticasone (FLOVENT HFA) 220 MCG/ACT inhaler INHALE 2 PUFFS BY MOUTH TWICE DAILY DURING FLARE UP AS NEEDED. RINSE, GARGLE AND SPIT AFTER USE 12 g 1   furosemide (LASIX) 20 MG tablet Take 1 tablet (20 mg total) by mouth as needed for  fluid (Take one daily as needed for swelling). 90 tablet 3   gabapentin (NEURONTIN) 600 MG tablet TAKE 1 TABLET BY MOUTH DAILY AT BREAKFAST, 1 TABLET AT LUNCH, AND 2 TABLETS AT BEDTIME 433 tablet 1   GARLIC PO Take 1 tablet by mouth daily.     GLYXAMBI 10-5 MG TABS TAKE 1 TABLET BY MOUTH DAILY 30 tablet 11   Homeopathic Products (THERAWORX RELIEF EX) Apply 1 spray topically daily as needed (muscle cramps).     hydrOXYzine (ATARAX/VISTARIL) 10 MG tablet Take 1 tablet (10 mg total) by mouth daily as needed for itching. 30 tablet 2   ipratropium (ATROVENT) 0.06 % nasal spray USE 1 TO 2 SPRAYS IN EACH NOSTRIL 1 TO 2 TIMES PER DAY 15 mL 3   irbesartan (AVAPRO) 150 MG tablet TAKE 1 TABLET(150 MG) BY MOUTH DAILY 30 tablet 8   Lactase (DAIRY-RELIEF PO) Take 1 capsule by mouth daily as needed (eating dairy).      Lancets (ONETOUCH DELICA PLUS [id number]) MISC USE TO CHECK BLOOD SUGAR UP TO TWICE DAILY 100 each 5   levalbuterol (XOPENEX) 1.25 MG/3ML nebulizer solution USE 1 VIAL VIA NEBULIZER EVERY 6 HOURS AS NEEDED FOR SHORTNESS OF BREATH OR WHEEZING 900 mL 1   liver oil-zinc oxide (DESITIN) 40 % ointment Apply 1 application topically as needed for irritation.     meclizine (ANTIVERT) 12.5 MG tablet Take 1 tablet (12.5 mg total) by mouth 3 (three) times daily as needed for dizziness. 15 tablet 0   Menthol-Methyl Salicylate (SALONPAS JET SPRAY EX) Apply 1 spray topically daily as needed (knee pain).     methimazole (TAPAZOLE) 5 MG tablet Take 0.5 tablets (2.5 mg total) by mouth every other day. 45 tablet 3   Misc Natural Products (NEURIVA PO) Take 2 tablets by mouth daily.     montelukast (SINGULAIR) 10 MG tablet Take 10 mg by mouth at bedtime.     omalizumab (XOLAIR) 150 MG injection Inject 300 mg into the skin every 28 (twenty-eight) days.     ondansetron (ZOFRAN-ODT) 4 MG disintegrating tablet Take 4 mg by mouth every 8 (eight) hours as needed for nausea or vomiting.      ONETOUCH VERIO test strip USE TO  CHECK BLOOD SUGAR UP TO TWICE DAILY 100 strip 5   OVER THE COUNTER MEDICATION Take 1 capsule by mouth daily. Bladder wak     OVER THE COUNTER MEDICATION Take 1 capsule by mouth daily. [employer]     OVER THE COUNTER MEDICATION Take 1 tablet by mouth daily. 7 support joint and comfort     pantoprazole (PROTONIX) 40 MG tablet TAKE 1 TABLET(40 MG) BY MOUTH DAILY 90 tablet 1  PATADAY 0.2 % SOLN Place 1 drop into both eyes daily as needed (allergies).   4   Polyethyl Glycol-Propyl Glycol (SYSTANE OP) Place 1 drop into both eyes in the morning and at bedtime.     potassium chloride SA (KLOR-CON) 20 MEQ tablet Take 1 tablet (20 mEq total) by mouth as needed (Take one daily when you need to take a Furosemide). 30 tablet 0   Pumpkin Seed-Soy Germ (AZO BLADDER CONTROL/GO-LESS PO) Take 1 tablet by mouth in the morning and at bedtime.     Tiotropium Bromide Monohydrate (SPIRIVA RESPIMAT) 1.25 MCG/ACT AERS Inhale 2 puffs into the lungs daily. 4 g 5   TRULANCE 3 MG TABS Take 3 mg by mouth every other day.     Turmeric 500 MG CAPS Take 1,000 mg by mouth daily.     XARELTO 20 MG TABS tablet TAKE 1 TABLET(20 MG) BY MOUTH DAILY WITH SUPPER 90 tablet 1   Facility-Administered Medications Prior to Visit  Medication Dose Route Frequency Provider Last Rate Last Admin   omalizumab Arvid Right) injection 300 mg  300 mg Subcutaneous Q28 days [name], MD   300 mg at 12/06/20 1109    PAST MEDICAL HISTORY: Past Medical History:  Diagnosis Date   Allergic rhinitis    Allergy    Arthritis    Asthma    Chronic headache    Diabetes mellitus    DVT (deep venous thrombosis) (HCC)    Dyspnea    Heart murmur    Hypertension    Penicillin allergy 01/19/2020   Pulmonary embolism (HCC)    Pulmonary hypertension ([place])    Seizures ([place])     PAST SURGICAL HISTORY: Past Surgical History:  Procedure Laterality Date   ABDOMINAL HYSTERECTOMY     partial, has ovaries   CARDIAC CATHETERIZATION  12/28/2010   Mod.  pulmonary hypertension, normal coronary arteries   CARDIOVERSION N/A 11/23/2020   Procedure: CARDIOVERSION;  Surgeon: Sanda Klein, MD;  Location: [place];  Service: Cardiovascular;  Laterality: N/A;   DOPPLER ECHOCARDIOGRAPHY  10/08/2011   EF=>55%,mild asymmetric LVH, mod. TR, mod. PH, mild to mod LA dilatation   IR LUMBAR DISC ASPIRATION W/IMG GUIDE  01/12/2020   KNEE ARTHROSCOPY Left    KNEE SURGERY     Nuclear Stress Test  05/20/2006   No ischemia   PARTIAL HYSTERECTOMY     PLANTAR FASCIA SURGERY     TONSILLECTOMY      FAMILY HISTORY: Family History  Problem Relation Age of Onset   Hypertension Mother    Clotting disorder Mother    Breast cancer Mother    Arthritis Mother    Stroke Mother    Diabetes Mother    Cancer Brother    Alcohol abuse Father    Arthritis Sister    Diabetes Sister    Multiple sclerosis Sister    Allergies Other        grandson   Allergic rhinitis Neg Hx    Angioedema Neg Hx    Asthma Neg Hx    Atopy Neg Hx    Eczema Neg Hx    Immunodeficiency Neg Hx    Urticaria Neg Hx     SOCIAL HISTORY: Social History   Socioeconomic History   Marital status: Widowed    Spouse name: Not on file   Number of children: Y   Years of education: Not on file   Highest education level: Not on file  Occupational History   Occupation: retired Geologist, engineering.  Tobacco Use   Smoking status: Never   Smokeless tobacco: Never  Vaping Use   Vaping Use: Never used  Substance and Sexual Activity   Alcohol use: Not Currently    Alcohol/week: 0.0 standard drinks    Comment: occ glass on wine   Drug use: No   Sexual activity: Never  Other Topics Concern   Not on file  Social History Narrative   Widow    limited exercise.   Social Determinants of Health   Financial Resource Strain: Not on file  Food Insecurity: Not on file  Transportation Needs: Not on file  Physical Activity: Not on file  Stress: Not on file  Social Connections: Not on file   Intimate Partner Violence: Not on file      PHYSICAL EXAM  Vitals:   12/22/20 1116  BP: 102/76  Pulse: (!) 57  Weight: 201 lb (91.2 kg)  Height: '5\' 4"'  (1.626 m)   Body mass index is 34.5 kg/m.  Generalized: Well developed, in no acute distress  Chest: Lungs clear to auscultation bilaterally  Neurological examination  Mentation: Alert oriented to time, place, history taking. Follows all commands speech and language fluent Cranial nerve II-XII: Extraocular movements were full, visual field were full on confrontational test Head turning and shoulder shrug  were normal and symmetric. Motor: The motor testing reveals 5 over 5 strength of all 4 extremities. Good symmetric motor tone is noted throughout.  Sensory: Sensory testing is intact to soft touch on all 4 extremities. No evidence of extinction is noted.  Gait and station: Gait is normal.    DIAGNOSTIC DATA (LABS, IMAGING, TESTING) - I reviewed patient records, labs, notes, testing and imaging myself where available.  Lab Results  Component Value Date   WBC 7.4 11/21/2020   HGB 13.4 11/21/2020   HCT 41.1 11/21/2020   MCV 79 11/21/2020   PLT 276 11/21/2020      Component Value Date/Time   NA 140 11/21/2020 1026   K 4.8 11/21/2020 1026   CL 105 11/21/2020 1026   CO2 22 11/21/2020 1026   GLUCOSE 116 (H) 11/21/2020 1026   GLUCOSE 159 (H) 10/14/2020 1521   BUN 16 11/21/2020 1026   CREATININE 1.09 (H) 11/21/2020 1026   CREATININE 1.09 (H) 10/14/2020 1521   CALCIUM 9.2 11/21/2020 1026   PROT 6.7 04/20/2020 1131   ALBUMIN 3.9 04/20/2020 1131   AST 15 04/20/2020 1131   ALT 7 04/20/2020 1131   ALKPHOS 84 04/20/2020 1131   BILITOT 0.8 04/20/2020 1131   GFRNONAA >60 10/10/2020 1900   GFRNONAA 53 (L) 01/04/2020 1059   GFRAA 62 01/04/2020 1059   Lab Results  Component Value Date   CHOL 117 04/20/2020   HDL 46.60 04/20/2020   LDLCALC 56 04/20/2020   TRIG 71.0 04/20/2020   CHOLHDL 3 04/20/2020   Lab Results   Component Value Date   HGBA1C 6.2 04/20/2020   Lab Results  Component Value Date   [id number] 093 08/29/2018   Lab Results  Component Value Date   TSH 4.07 12/15/2020      ASSESSMENT AND PLAN 75 y.o. year old female  has a past medical history of Allergic rhinitis, Allergy, Arthritis, Asthma, Chronic headache, Diabetes mellitus, DVT (deep venous thrombosis) ([place]), Dyspnea, Heart murmur, Hypertension, Penicillin allergy (01/19/2020), Pulmonary embolism ([place]), Pulmonary hypertension ([place]), and Seizures ([place]). here with:  OSA on CPAP  - CPAP compliance excellent - Good treatment of AHI  - Encourage patient to use CPAP nightly  and > 4 hours each night - F/U in 1 year or sooner if needed    Ward Givens, MSN, NP-C 12/22/2020, 11:39 AM [facility] [address] [place], [place] [address] [phone]

## 2020-12-26 ENCOUNTER — Telehealth: Payer: Self-pay

## 2020-12-26 ENCOUNTER — Other Ambulatory Visit: Payer: Self-pay | Admitting: Allergy and Immunology

## 2020-12-26 NOTE — Telephone Encounter (Signed)
Patient called to make appointment for rt > lt leg cramping off and on for about 3 weeks. Has not had any increase in symptoms. Did not see where anything was open this week. Advised patient that I would send message to [name] to review and call to make appointment

## 2020-12-27 ENCOUNTER — Ambulatory Visit (INDEPENDENT_AMBULATORY_CARE_PROVIDER_SITE_OTHER): Payer: Medicare Other

## 2020-12-27 ENCOUNTER — Telehealth: Payer: Self-pay | Admitting: Cardiovascular Disease

## 2020-12-27 ENCOUNTER — Other Ambulatory Visit: Payer: Self-pay

## 2020-12-27 DIAGNOSIS — Z86711 Personal history of pulmonary embolism: Secondary | ICD-10-CM

## 2020-12-27 DIAGNOSIS — I152 Hypertension secondary to endocrine disorders: Secondary | ICD-10-CM | POA: Diagnosis not present

## 2020-12-27 DIAGNOSIS — M1711 Unilateral primary osteoarthritis, right knee: Secondary | ICD-10-CM | POA: Diagnosis not present

## 2020-12-27 DIAGNOSIS — E1159 Type 2 diabetes mellitus with other circulatory complications: Secondary | ICD-10-CM | POA: Diagnosis not present

## 2020-12-27 DIAGNOSIS — G8929 Other chronic pain: Secondary | ICD-10-CM

## 2020-12-27 NOTE — Telephone Encounter (Signed)
Spoke with Ms. Cleckley.  She only wants to see Dr. Diona Browner.  First available appointment is not until 01/13/21.  Please advise if there is somewhere you are willing to work her in.

## 2020-12-27 NOTE — Telephone Encounter (Signed)
Add her on to 3:40 on Friday.

## 2020-12-27 NOTE — Telephone Encounter (Signed)
Pt c/o medication issue: 1. Name of Medication: dextro  2. How are you currently taking this medication (dosage and times per day)? 1 time a day 3. Are you having a reaction (difficulty breathing--STAT)?  No  4. What is your medication issue? Patient wanted to know if these medication working.

## 2020-12-27 NOTE — Telephone Encounter (Signed)
Returned call to patient of Dr. Sallyanne Kuster  She was talking with pharmacy staff at her PCP office and was advised to take xarelto at night. Explained that xarelto is best taken with largest meal of the day and only once daily.

## 2020-12-27 NOTE — Progress Notes (Signed)
Chronic Care Management Pharmacy Note  12/27/20 Name:  [name] MRN:  [id number] DOB:  [birth year]  Subjective: [name] is an 75 y.o. year old female who is a primary patient of Bedsole, Amy E, MD.  The CCM team was consulted for assistance with disease management and care coordination needs.    Engaged with patient by telephone for follow up visit in response to provider referral for pharmacy case management and/or care coordination services. She reports primary health concern is leg cramps in right leg. Denies missed dose of blood thinner. Denies swelling. She has visit with PCP scheduled for 12/30/20. She states she had a good report from cardiology. No chronic health concerns. Does not monitor sugars or BP.    Consent to Services:  The patient was given information about Chronic Care Management services, agreed to services, and gave verbal consent prior to initiation of services.  Please see initial visit note for detailed documentation.   Patient Care Team: [name], MD as PCP - General (Family Medicine) Croitoru, Dani Gobble, MD as PCP - Cardiology (Cardiology) Hortencia Pilar, MD as Consulting Physician (Ophthalmology) Neldon Mc, Donnamarie Poag, MD as Consulting Physician (Allergy and Immunology) Anda Kraft, MD as Consulting Physician (Endocrinology) Inocencio Homes, DPM as Consulting Physician (Podiatry) Croitoru, Dani Gobble, MD as Consulting Physician (Cardiology) Druscilla Brownie, MD as Consulting Physician (Dermatology) Latanya Maudlin, MD as Consulting Physician (Orthopedic Surgery) Boyd Kerbs, MD as Referring Physician (Specialist) Dionisio David, [place] as Referring Physician (Chiropractic Medicine) Debbora Dus, [facility] as Pharmacist (Pharmacist)  Recent office visits: 11/25/20 - PCP visit - Contusion of right lower leg, no clear sign of DVT. No calf pain. Has contusion and possible small hematoma anteriorly. On Xarelto. Encourage ice, elevation, Tylenol.  Recent  consult visits: 12/22/20 - Cardiology - Change potassium and Lasix to once daily as needed 12/15/20 - Endocrinology - Graves disease, TSH elevated, she mentioned that she was in fact not taking the methimazole 2.5 mg every other day, but every day.  In this case, we will switch it to every other day  Hospital visits: 10/10/20 - 10/11/20 - SOB, start furosemide and potassium  Objective:  Lab Results  Component Value Date   CREATININE 1.09 (H) 11/21/2020   BUN 16 11/21/2020   GFR 62.27 04/20/2020   GFRNONAA >60 10/10/2020   GFRAA 62 01/04/2020   NA 140 11/21/2020   K 4.8 11/21/2020   CALCIUM 9.2 11/21/2020   CO2 22 11/21/2020   GLUCOSE 116 (H) 11/21/2020    Lab Results  Component Value Date/Time   HGBA1C 6.2 04/20/2020 11:31 AM   HGBA1C 6.4 04/09/2019 01:32 PM   HGBA1C 6.2 08/14/2016 12:00 AM   HGBA1C 6.1 01/27/2016 12:00 AM   GFR 62.27 04/20/2020 11:31 AM   GFR 70.69 04/09/2019 01:32 PM    Last diabetic Eye exam:  Lab Results  Component Value Date/Time   HMDIABEYEEXA No Retinopathy 01/05/2020 12:00 AM    Last diabetic Foot exam:  Lab Results  Component Value Date/Time   HMDIABFOOTEX done 04/19/2020 12:00 AM     Lab Results  Component Value Date   CHOL 117 04/20/2020   HDL 46.60 04/20/2020   LDLCALC 56 04/20/2020   TRIG 71.0 04/20/2020   CHOLHDL 3 04/20/2020    Hepatic Function Latest Ref Rng & Units 04/20/2020 01/04/2020 04/09/2019  Total Protein 6.0 - 8.3 g/dL 6.7 6.6 6.6  Albumin 3.5 - 5.2 g/dL 3.9 - 4.1  AST 0 - 37 U/L '15 17 13  ' ALT  0 - 35 U/L '7 10 7  ' Alk Phosphatase 39 - 117 U/L 84 - 103  Total Bilirubin 0.2 - 1.2 mg/dL 0.8 0.5 0.6    Lab Results  Component Value Date/Time   TSH 4.07 12/15/2020 03:48 PM   TSH 3.16 11/29/2020 10:25 AM   FREET4 1.11 12/15/2020 03:48 PM   FREET4 1.05 11/29/2020 10:25 AM    CBC Latest Ref Rng & Units 11/21/2020 10/10/2020 01/04/2020  WBC 3.4 - 10.8 x10E3/uL 7.4 9.5 6.1  Hemoglobin 11.1 - 15.9 g/dL 13.4 12.7 12.2  Hematocrit  34.0 - 46.6 % 41.1 38.3 38.3  Platelets 150 - 450 x10E3/uL 276 313 344    Lab Results  Component Value Date/Time   VD25OH 23 (L) 08/29/2018 04:15 PM   VD25OH 31.13 03/28/2018 11:49 AM   VD25OH 35.38 03/25/2017 12:06 PM    Clinical ASCVD: No  The ASCVD Risk score (Goff DC Jr., et al., 2013) failed to calculate for the following reasons:   The valid total cholesterol range is 130 to 320 mg/dL    Depression screen [facility] 2/9 06/20/2020 04/19/2020 01/04/2020  Decreased Interest 0 0 0  Down, Depressed, Hopeless 0 0 0  PHQ - 2 Score 0 0 0  Altered sleeping - 0 -  Tired, decreased energy - 1 -  Change in appetite - 0 -  Feeling bad or failure about yourself  - 0 -  Trouble concentrating - 0 -  Moving slowly or fidgety/restless - 0 -  Suicidal thoughts - 0 -  PHQ-9 Score - 1 -  Difficult doing work/chores - Not difficult at all -  Some recent data might be hidden    Social History   Tobacco Use  Smoking Status Never  Smokeless Tobacco Never   BP Readings from Last 3 Encounters:  12/22/20 102/76  12/22/20 120/72  12/15/20 110/68   Pulse Readings from Last 3 Encounters:  12/22/20 (!) 57  12/22/20 60  12/15/20 (!) 52   Wt Readings from Last 3 Encounters:  12/22/20 201 lb (91.2 kg)  12/22/20 200 lb (90.7 kg)  12/15/20 200 lb 12.8 oz (91.1 kg)   BMI Readings from Last 3 Encounters:  12/22/20 34.50 kg/m  12/22/20 34.33 kg/m  12/15/20 35.01 kg/m    Assessment/Interventions: Review of patient past medical history, allergies, medications, health status, including review of consultants reports, laboratory and other test data, was performed as part of comprehensive evaluation and provision of chronic care management services.   SDOH:  (Social Determinants of Health) assessments and interventions performed: Yes SDOH Interventions    Flowsheet Row Most Recent Value  SDOH Interventions   Financial Strain Interventions Intervention Not Indicated      SDOH Screenings    Alcohol Screen: Not on file  Depression (PHQ2-9): Low Risk    PHQ-2 Score: 0  Financial Resource Strain: Low Risk    Difficulty of Paying Living Expenses: Not very hard  Food Insecurity: Not on file  Housing: Not on file  Physical Activity: Not on file  Social Connections: Not on file  Stress: Not on file  Tobacco Use: Low Risk    Smoking Tobacco Use: Never   Smokeless Tobacco Use: Never  Transportation Needs: Not on file    CCM Care Plan  Allergies  Allergen Reactions   Latex Hives   Penicillins Swelling    Has patient had a PCN reaction causing immediate rash, facial/tongue/throat swelling, SOB or lightheadedness with hypotension patient had a PCN reaction causing severe rash involving  mucus membranes or skin necrosis: [id number]} Has patient had a PCN reaction that required hospitalization/No Has patient had a PCN reaction occurring within the last 10 years: No If all of the above answers are "NO", then may proceed with Cephalosporin use.      Oxycodone-Acetaminophen Nausea And Vomiting   Lyrica [Pregabalin] Other (See Comments)    Blurred vision.   Nickel Rash   Phenytoin Sodium Extended Swelling   Vicodin [Hydrocodone-Acetaminophen] Nausea And Vomiting    Medications Reviewed Today     Reviewed by Debbora Dus, [facility] (Pharmacist) on 12/27/20 at [place] List Status: <None>   Medication Order Taking? Sig Documenting Provider Last Dose Status Informant  acetaminophen (TYLENOL) 650 MG CR tablet [id number] Yes Take 1,300 mg by mouth daily as needed for pain. [provider] Taking Active Self           Med Note Iona Beard, STEPHANIE A   Thu Dec 22, 2020  9:31 AM)    Acetaminophen-Codeine 300-30 MG tablet [id number] No Take 1 tablet by mouth every 6 (six) hours as needed for pain.  Patient not taking: Reported on 12/27/2020   [provider] Not Taking Active Self  ADVAIR HFA 230-21 MCG/ACT inhaler [id number] Yes INHALE 2 PUFFS BY MOUTH TWICE DAILY TO  PREVENT COUGH OR WHEEZING, RINSE MOUTH AFTER USE Kozlow, Donnamarie Poag, MD Taking Active   albuterol (VENTOLIN HFA) 108 (90 Base) MCG/ACT inhaler [id number] Yes INHALE 2 PUFFS BY MOUTH EVERY 6 HOURS AS NEEDED FOR WHEEZING OR SHORTNESS OF BREATH Kozlow, Donnamarie Poag, MD Taking Active   atorvastatin (LIPITOR) 10 MG tablet [id number] Yes TAKE 1 TABLET(10 MG) BY MOUTH DAILY Bedsole, Amy E, MD Taking Active Self  Blood Glucose Monitoring Suppl (ONETOUCH VERIO) w/Device KIT [id number] No Use to check blood sugar up to 2 times a day  Patient not taking: Reported on 12/27/2020   [name], MD Not Taking Active Self  Coenzyme Q10 (COQ-10) 100 MG CAPS [id number] Yes Take 100 mg by mouth daily. [provider] Taking Active Self  cyclobenzaprine (FLEXERIL) 10 MG tablet [id number]  TAKE 1 TABLET(10 MG) BY MOUTH AT BEDTIME AS NEEDED FOR MUSCLE SPASMS Bedsole, Amy E, MD  Active Self  dextromethorphan-guaiFENesin (MUCINEX DM) 30-600 MG 12hr tablet [id number] Yes Take 1 tablet by mouth 2 (two) times daily. [provider] Taking Active Self  DM-APAP-CPM (CORICIDIN HBP PO) [id number] No Take 1 tablet by mouth daily as needed (cough/cold).  Patient not taking: Reported on 12/27/2020   [provider] Not Taking Active Self  EPINEPHrine 0.3 mg/0.3 mL IJ SOAJ injection [id number] No USE AS DIRECTED FOR LIFE THREATENING ALLERGIC REACTIONS  Patient not taking: Reported on 12/27/2020   [name], MD Not Taking Active   famotidine (PEPCID) 40 MG tablet [id number] Yes TAKE 1 TABLET(40 MG) BY MOUTH DAILY Kozlow, Donnamarie Poag, MD Taking Active   Ferrous Sulfate (IRON) 325 (65 Fe) MG TABS [id number] Yes Take 325 mg by mouth daily. [provider] Taking Active Self  fluticasone (FLOVENT HFA) 220 MCG/ACT inhaler [id number] Yes INHALE 2 PUFFS BY MOUTH TWICE DAILY DURING FLARE UP AS NEEDED. RINSE, GARGLE AND SPIT AFTER USE Kozlow, Donnamarie Poag, MD Taking Active Self  furosemide (LASIX) 20 MG tablet [id number] Yes Take 1  tablet (20 mg total) by mouth as needed for fluid (Take one daily as needed for swelling). Croitoru, Mihai, MD Taking Active   gabapentin (NEURONTIN) 600 MG tablet [id number] Yes TAKE 1 TABLET BY MOUTH DAILY AT  BREAKFAST, 1 TABLET AT LUNCH, AND 2 TABLETS AT BEDTIME Garrel Ridgel, DPM Taking Active   GARLIC PO [id number]  Take 1 tablet by mouth daily. [provider]  Active Self  GLYXAMBI 10-5 MG TABS [id number] Yes TAKE 1 TABLET BY MOUTH DAILY [name], MD Taking Active Self  Homeopathic Products ([place]) [id number] Yes Apply 1 spray topically daily as needed (muscle cramps). [provider] Taking Active Self  hydrOXYzine (ATARAX/VISTARIL) 10 MG tablet [id number] Yes Take 1 tablet (10 mg total) by mouth daily as needed for itching. [name], MD Taking Active   ipratropium (ATROVENT) 0.06 % nasal spray [id number] Yes USE 1 TO 2 SPRAYS IN EACH NOSTRIL 1 TO 2 TIMES PER DAY Kozlow, Donnamarie Poag, MD Taking Active   irbesartan (AVAPRO) 150 MG tablet [id number] Yes TAKE 1 TABLET(150 MG) BY MOUTH DAILY Croitoru, Mihai, MD Taking Active Self  Lactase (DAIRY-RELIEF PO) [id number] Yes Take 1 capsule by mouth daily as needed (eating dairy).  [provider] Taking Active Self  Lancets [name] DELICA PLUS [id number]) [employer] [id number] No USE TO CHECK BLOOD SUGAR UP TO TWICE DAILY  Patient not taking: Reported on 12/27/2020   [name], MD Not Taking Active Self  levalbuterol (XOPENEX) 1.25 MG/3ML nebulizer solution [id number] Yes USE 1 VIAL VIA NEBULIZER EVERY 6 HOURS AS NEEDED FOR SHORTNESS OF BREATH OR WHEEZING Kozlow, Donnamarie Poag, MD Taking Active   liver oil-zinc oxide (DESITIN) 40 % ointment [id number]  Apply 1 application topically as needed for irritation. [provider]  Active Self  meclizine (ANTIVERT) 12.5 MG tablet [id number] Yes Take 1 tablet (12.5 mg total) by mouth 3 (three) times daily as needed for dizziness. [name], MD Taking Active Self   Menthol-Methyl Salicylate (SALONPAS JET SPRAY EX) [id number] Yes Apply 1 spray topically daily as needed (knee pain). [provider] Taking Active Self  methimazole (TAPAZOLE) 5 MG tablet [id number] Yes Take 0.5 tablets (2.5 mg total) by mouth every other day. Philemon Kingdom, MD Taking Active   Misc Natural Products (NEURIVA PO) [id number] Yes Take 2 tablets by mouth daily. [provider] Taking Active Self           Med Note Merri Ray A   Thu Dec 22, 2020  9:34 AM)    montelukast (SINGULAIR) 10 MG tablet [id number] Yes TAKE 1 TABLET(10 MG) BY MOUTH DAILY Kozlow, Donnamarie Poag, MD Taking Active   omalizumab Arvid Right) 150 MG injection [id number] Yes Inject 300 mg into the skin every 28 (twenty-eight) days. [provider] Taking Active Self  omalizumab Arvid Right) injection 300 mg [id number]   [name], MD  Active   ondansetron (ZOFRAN-ODT) 4 MG disintegrating tablet [id number] Yes Take 4 mg by mouth every 8 (eight) hours as needed for nausea or vomiting.  [provider] Taking Active Self  ONETOUCH VERIO test strip [id number] No USE TO CHECK BLOOD SUGAR UP TO TWICE DAILY  Patient not taking: Reported on 12/27/2020   [name], MD Not Taking Active Self  OVER THE COUNTER MEDICATION [id number]  Take 1 capsule by mouth daily. Bladder wak [provider]  Active Self  OVER THE COUNTER MEDICATION [id number]  Take 1 capsule by mouth daily. [employer], Historical, MD  Active Self  OVER THE COUNTER MEDICATION [id number]  Take 1 tablet by mouth daily. 7 support joint and comfort [provider]  Active Self  pantoprazole (PROTONIX) 40 MG tablet [id number] Yes TAKE 1  TABLET(40 MG) BY MOUTH DAILY Kozlow, Donnamarie Poag, MD Taking Active Self  PATADAY 0.2 % SOLN [id number]  Place 1 drop into both eyes daily as needed (allergies).  [provider]  Active Self  Polyethyl Glycol-Propyl Glycol (SYSTANE OP) [id number]  Place 1 drop into both eyes  in the morning and at bedtime. [provider]  Active Self  potassium chloride SA (KLOR-CON) 20 MEQ tablet [id number] Yes Take 1 tablet (20 mEq total) by mouth as needed (Take one daily when you need to take a Furosemide). Croitoru, Mihai, MD Taking Active   Pumpkin Seed-Soy Germ (AZO BLADDER CONTROL/GO-LESS PO) [id number] Yes Take 1 tablet by mouth in the morning and at bedtime. [provider] Taking Active Self  Tiotropium Bromide Monohydrate (SPIRIVA RESPIMAT) 1.25 MCG/ACT AERS [id number] Yes Inhale 2 puffs into the lungs daily. [name], MD Taking Active Self  TRULANCE 3 MG TABS [id number] Yes Take 3 mg by mouth every other day. [provider] Taking Active Self  Turmeric 500 MG CAPS [id number] Yes Take 1,000 mg by mouth daily. [provider] Taking Active Self  XARELTO 20 MG TABS tablet [id number] Yes TAKE 1 TABLET(20 MG) BY MOUTH DAILY WITH SUPPER Croitoru, Mihai, MD Taking Active Self  Med List Note Merceda Elks, CPhT 01/07/20 1114): Pt has CPAP Machine             Patient Active Problem List   Diagnosis Date Noted   Contusion of right lower leg 11/25/2020   Persistent atrial fibrillation ([place])    SOB (shortness of breath) 10/14/2020   Severe persistent asthma with (acute) exacerbation 10/07/2020   Skin ulcer of buttock, limited to breakdown of skin ([place]) 08/05/2020   Controlled type 2 diabetes mellitus without complication ([place]) 74/06/8785   Arm pain, lateral, right 06/03/2020   High risk medication use 02/02/2020   Penicillin allergy 01/19/2020   Discitis of lumbosacral region 01/04/2020   Hyperlipidemia associated with type 2 diabetes mellitus ([place]) 05/07/2019   Chronic pain 12/11/2018   Obstructive sleep apnea treated with continuous positive airway pressure (CPAP) 06/03/2018   Encounter for chronic pain management 04/01/2018   Left thyroid nodule 02/06/2018   Insomnia secondary to chronic pain 01/26/2018   Class 3  drug-induced obesity with serious comorbidity and body mass index (BMI) of 40.0 to 44.9 in adult ([place]) 12/03/2017   Graves disease 10/15/2017   Mild obstructive sleep apnea 10/15/2017   Hot flashes not due to menopause 09/27/2017   Chronic low back pain without sciatica 12/03/2016   Generalized abdominal pain 11/26/2016   Bilateral high frequency sensorineural hearing loss 10/24/2016   Subjective tinnitus, bilateral 10/24/2016   Candidal intertrigo 07/24/2016   Groin pain 04/27/2016   Urge incontinence 03/27/2016   Moderate persistent asthma 07/12/2015   Paroxysmal atrial fibrillation with rapid ventricular response ([place]) 06/03/2015   Counseling regarding end of life decision making 05/05/2015   Neuropathy due to secondary diabetes ([place]) 01/11/2015   Pain in Achilles tendon 01/11/2015   Migraine headache without aura 08/24/2014   Allergic rhinitis 03/25/2014   DVT (deep venous thrombosis) hx of  03/25/2014   Osteoarthritis of right knee 03/25/2014    ? of Seizure disorder 03/25/2014   Depression, major, in remission ([place]) 03/25/2014   Diabetes mellitus with neuropathy ([place]) 03/25/2014   Hypertension associated with diabetes ([place]) 03/25/2014   GERD (gastroesophageal reflux disease) 03/25/2014   History of pulmonary embolism 10/09/2013   Hypercoagulable state ([place]) 01/29/2011   Asthma, moderate persistent 01/24/2011  Pulmonary hypertension ([place]) 01/11/2011    Immunization History  Administered Date(s) Administered   Fluad Quad(high Dose 65+) 04/10/2019   Influenza,inj,Quad PF,6+ Mos 03/25/2014, 03/22/2016, 04/04/2017, 04/01/2018, 06/03/2020   Influenza,inj,quad, With Preservative 03/16/2018   Influenza-Unspecified 04/13/2015, 03/18/2017   PFIZER(Purple Top)SARS-COV-2 Vaccination 01/04/2020, 01/25/2020   Pneumococcal Conjugate-13 05/05/2015   Pneumococcal Polysaccharide-23 04/03/2012   Tdap 10/19/2013   Zoster, Live 05/11/2013    Conditions to be addressed/monitored:   Atrial Fibrillation and Osteoarthritis   Patient Care Plan: [place] Plan     Problem Identified: CHL AMB "PATIENT-SPECIFIC PROBLEM"      Long-Range Goal: Disease Management   Start Date: 12/30/2020  Priority: High  Note:   Current Barriers:  None identified  Pharmacist Clinical Goal(s):  Patient will contact provider office for questions/concerns as evidenced notation of same in electronic health record through collaboration with PharmD and provider.   Interventions: 1:1 collaboration with [name], MD regarding development and update of comprehensive plan of care as evidenced by provider attestation and co-signature Inter-disciplinary care team collaboration (see longitudinal plan of care) Comprehensive medication review performed; medication list updated in electronic medical record  Atrial Fibrillation (Goal: prevent stroke and major bleeding) -Controlled - managed by cardiology -Current treatment: Rate control: none  Anticoagulation: Xarelto 15 mg - 1 tablet daily with evening meal -Medications previously tried: none reported -Home BP and HR readings: not checking, she has a home BP monitor   -Xarelto - encouraged to take same time every day with largest meal. She is taking in morning at various times (8A-12P). Denies aspirin, aleve, or other NSAID use. The only thing she takes for pain is Tylenol.  -Counseled on importance of adherence to anticoagulant exactly as prescribed; bleeding risk associated with Xarelto and importance of self-monitoring for signs/symptoms of bleeding; avoidance of NSAIDs due to increased bleeding risk with anticoagulants; -Recommended to continue current medication  Osteoarthritis (Goal: Reduce pain) -Not ideally controlled- per pt report -Pt reports chronic back  and knee pain - She applies salonpas to knee but difficulty reaching her back. She uses lidocaine spray on her back and it helps some, but not as much as the patch. Uses  Tylenol PRN. -Current treatment  Tylenol 650 mg - 1 tablet daily as needed -Medications previously tried: multiple  -Recommended try scheduling the Tylenol with breakfast and supper. May add lunch as well if needed.  Patient Goals/Self-Care Activities Patient will:  - take medications as prescribed  Follow Up Plan: Telephone follow up appointment with care management team member scheduled for:  6 months    Recommended reducing supplement use - try off Prevagen.  Medication Assistance: None required.  Patient affirms current coverage meets needs.  Compliance/Adherence/Medication fill history: Care Gaps: Shingrix  COVID-19 Booster A1c every 6 months  Star-Rating Drugs: Medication:                Last Fill:         Day Supply Atorvastatin 10 mg       10/17/2020          90ds Glyxambi 10-5 mg        12/26/2020          30ds  Patient's preferred pharmacy is: [id number] [address], [address] [name] [address] Phone: [phone] Fax: [phone]  Uses pill box? Yes Pt endorses 100% compliance  We discussed: Current pharmacy is  preferred with insurance plan and patient is satisfied with pharmacy services Patient decided to: Continue current medication management strategy  Care Plan and Follow Up Patient Decision:  Patient agrees to Care Plan and Follow-up.  Debbora Dus, PharmD Clinical Pharmacist [facility] at [facility] [phone]

## 2020-12-27 NOTE — Telephone Encounter (Signed)
Appointment scheduled for 12/30/2020 at 3:40 pm with Dr. Diona Browner.  Ms. Hecht notified by telephone.

## 2020-12-30 ENCOUNTER — Ambulatory Visit (INDEPENDENT_AMBULATORY_CARE_PROVIDER_SITE_OTHER): Payer: Medicare Other | Admitting: Family Medicine

## 2020-12-30 ENCOUNTER — Other Ambulatory Visit: Payer: Self-pay

## 2020-12-30 ENCOUNTER — Encounter: Payer: Self-pay | Admitting: Family Medicine

## 2020-12-30 VITALS — BP 102/60 | HR 89 | Temp 97.6°F | Ht 63.5 in | Wt 209.0 lb

## 2020-12-30 DIAGNOSIS — R252 Cramp and spasm: Secondary | ICD-10-CM

## 2020-12-30 DIAGNOSIS — Z8739 Personal history of other diseases of the musculoskeletal system and connective tissue: Secondary | ICD-10-CM | POA: Diagnosis not present

## 2020-12-30 DIAGNOSIS — G8929 Other chronic pain: Secondary | ICD-10-CM | POA: Diagnosis not present

## 2020-12-30 DIAGNOSIS — M545 Low back pain, unspecified: Secondary | ICD-10-CM | POA: Diagnosis not present

## 2020-12-30 NOTE — Patient Instructions (Addendum)
We will work on setting up a second opinion for back pain.  We will call with lab results.  Start daily stretching of legs.  Hold atorvastatin for 1 week.. if cramps not better...restart at lower dose.. three days a week.

## 2020-12-30 NOTE — Assessment & Plan Note (Signed)
On less lasix , taking potassium. Wil check for low potassium.  Encouraged hydration. Recent thyroid check stable.Marland Kitchen ENDO decrease thyroid treatment.   ? If statin causing as SE... hold temporarily for trial.. restart if cramps not improved.

## 2020-12-30 NOTE — Assessment & Plan Note (Addendum)
Has seen Dr. Gladstone Lighter in past... continued chronic pain.  She would like referral for a second opinion.  requests: Dr. Beatris Ship  [facility]  [phone]

## 2020-12-30 NOTE — Progress Notes (Signed)
Patient ID: MARIACELESTE HERRERA, female    DOB: [birth year], 75 y.o.   MRN: [id number]  This visit was conducted in person.  BP 102/60   Pulse 89   Temp 97.6 F (36.4 C) (Temporal)   Ht 5' 3.5" (1.613 m)   Wt 209 lb (94.8 kg)   PF 97 L/min   BMI 36.44 kg/m    CC: Chief Complaint  Patient presents with   Leg Cramps    Subjective:   HPI: ADIANA SMELCER is a 75 y.o. female presenting on 12/30/2020 for Leg Cramps  She has been  having intermittent leg cramps in calves.. mainly at night. Ongoing now for several weeks. She denies creepy crawly feeling in legs/restless feeling. No current leg swelling. She does have bilateral varicose veins.   She is taking lasix less often.. every other day.. fluid status is stable. Taking less potassium given using less lasix.  To treat she moves legs, uses mustard, tonic water with some relief.   Chronic low back pain. S/P steroid injection  History of discitis and osteomyelitis  Treated in past by Dr. Gladstone Lighter.  Continues to have low back pain.. uses tylenol with codeine to treat but limits.   08/2020 IMPRESSION: 1. Treated discitis/osteomyelitis at L4-5 and L5-S1. There is a similar degree of marrow and paraspinal inflammation as seen May 2021, please correlate for clinical/laboratory signs of active infection. No visible abscess. 2. Underlying degenerative disease with high-grade spinal stenosis at L4-5. 3. Moderate bilateral foraminal narrowing at L4-5 and L5-S1.  Relevant past medical, surgical, family and social history reviewed and updated as indicated. Interim medical history since our last visit reviewed. Allergies and medications reviewed and updated. Outpatient Medications Prior to Visit  Medication Sig Dispense Refill   acetaminophen (TYLENOL) 650 MG CR tablet Take 1,300 mg by mouth daily as needed for pain.     Acetaminophen-Codeine 300-30 MG tablet Take 1 tablet by mouth every 6 (six) hours as needed for pain.     ADVAIR HFA  230-21 MCG/ACT inhaler INHALE 2 PUFFS BY MOUTH TWICE DAILY TO PREVENT COUGH OR WHEEZING, RINSE MOUTH AFTER USE 12 g 4   albuterol (VENTOLIN HFA) 108 (90 Base) MCG/ACT inhaler INHALE 2 PUFFS BY MOUTH EVERY 6 HOURS AS NEEDED FOR WHEEZING OR SHORTNESS OF BREATH 54 g 0   atorvastatin (LIPITOR) 10 MG tablet TAKE 1 TABLET(10 MG) BY MOUTH DAILY 90 tablet 1   Blood Glucose Monitoring Suppl (ONETOUCH VERIO) w/Device KIT Use to check blood sugar up to 2 times a day 1 kit 0   Coenzyme Q10 (COQ-10) 100 MG CAPS Take 100 mg by mouth daily.     cyclobenzaprine (FLEXERIL) 10 MG tablet TAKE 1 TABLET(10 MG) BY MOUTH AT BEDTIME AS NEEDED FOR MUSCLE SPASMS 30 tablet 1   dextromethorphan-guaiFENesin (MUCINEX DM) 30-600 MG 12hr tablet Take 1 tablet by mouth 2 (two) times daily.     DM-APAP-CPM (CORICIDIN HBP PO) Take 1 tablet by mouth daily as needed (cough/cold).     EPINEPHrine 0.3 mg/0.3 mL IJ SOAJ injection USE AS DIRECTED FOR LIFE THREATENING ALLERGIC REACTIONS 2 each 2   Ferrous Sulfate (IRON) 325 (65 Fe) MG TABS Take 325 mg by mouth daily.     fluticasone (FLOVENT HFA) 220 MCG/ACT inhaler INHALE 2 PUFFS BY MOUTH TWICE DAILY DURING FLARE UP AS NEEDED. RINSE, GARGLE AND SPIT AFTER USE 12 g 1   furosemide (LASIX) 20 MG tablet Take 1 tablet (20 mg total) by mouth as needed  for fluid (Take one daily as needed for swelling). 90 tablet 3   gabapentin (NEURONTIN) 600 MG tablet TAKE 1 TABLET BY MOUTH DAILY AT BREAKFAST, 1 TABLET AT LUNCH, AND 2 TABLETS AT BEDTIME 626 tablet 1   GARLIC PO Take 1 tablet by mouth daily.     GLYXAMBI 10-5 MG TABS TAKE 1 TABLET BY MOUTH DAILY 30 tablet 11   Homeopathic Products (THERAWORX RELIEF EX) Apply 1 spray topically daily as needed (muscle cramps).     hydrOXYzine (ATARAX/VISTARIL) 10 MG tablet Take 1 tablet (10 mg total) by mouth daily as needed for itching. 30 tablet 2   ipratropium (ATROVENT) 0.06 % nasal spray USE 1 TO 2 SPRAYS IN EACH NOSTRIL 1 TO 2 TIMES PER DAY 15 mL 3    irbesartan (AVAPRO) 150 MG tablet TAKE 1 TABLET(150 MG) BY MOUTH DAILY 30 tablet 8   Lactase (DAIRY-RELIEF PO) Take 1 capsule by mouth daily as needed (eating dairy).      Lancets (ONETOUCH DELICA PLUS [id number]) MISC USE TO CHECK BLOOD SUGAR UP TO TWICE DAILY 100 each 5   levalbuterol (XOPENEX) 1.25 MG/3ML nebulizer solution USE 1 VIAL VIA NEBULIZER EVERY 6 HOURS AS NEEDED FOR SHORTNESS OF BREATH OR WHEEZING 900 mL 1   liver oil-zinc oxide (DESITIN) 40 % ointment Apply 1 application topically as needed for irritation.     meclizine (ANTIVERT) 12.5 MG tablet Take 1 tablet (12.5 mg total) by mouth 3 (three) times daily as needed for dizziness. 15 tablet 0   Menthol-Methyl Salicylate (SALONPAS JET SPRAY EX) Apply 1 spray topically daily as needed (knee pain).     methimazole (TAPAZOLE) 5 MG tablet Take 0.5 tablets (2.5 mg total) by mouth every other day. 45 tablet 3   Misc Natural Products (NEURIVA PO) Take 2 tablets by mouth daily.     montelukast (SINGULAIR) 10 MG tablet TAKE 1 TABLET(10 MG) BY MOUTH DAILY 90 tablet 1   omalizumab (XOLAIR) 150 MG injection Inject 300 mg into the skin every 28 (twenty-eight) days.     ondansetron (ZOFRAN-ODT) 4 MG disintegrating tablet Take 4 mg by mouth every 8 (eight) hours as needed for nausea or vomiting.      ONETOUCH VERIO test strip USE TO CHECK BLOOD SUGAR UP TO TWICE DAILY 100 strip 5   OVER THE COUNTER MEDICATION Take 1 capsule by mouth daily. Bladder wak     OVER THE COUNTER MEDICATION Take 1 capsule by mouth daily. [employer]     OVER THE COUNTER MEDICATION Take 1 tablet by mouth daily. 7 support joint and comfort     pantoprazole (PROTONIX) 40 MG tablet TAKE 1 TABLET(40 MG) BY MOUTH DAILY 90 tablet 1   PATADAY 0.2 % SOLN Place 1 drop into both eyes daily as needed (allergies).   4   Polyethyl Glycol-Propyl Glycol (SYSTANE OP) Place 1 drop into both eyes in the morning and at bedtime.     potassium chloride SA (KLOR-CON) 20 MEQ tablet Take 1 tablet  (20 mEq total) by mouth as needed (Take one daily when you need to take a Furosemide). 30 tablet 0   Tiotropium Bromide Monohydrate (SPIRIVA RESPIMAT) 1.25 MCG/ACT AERS Inhale 2 puffs into the lungs daily. 4 g 5   TRULANCE 3 MG TABS Take 3 mg by mouth every other day.     Turmeric 500 MG CAPS Take 1,000 mg by mouth daily.     XARELTO 20 MG TABS tablet TAKE 1 TABLET(20 MG) BY MOUTH  DAILY WITH SUPPER 90 tablet 1   famotidine (PEPCID) 40 MG tablet TAKE 1 TABLET(40 MG) BY MOUTH DAILY 30 tablet 3   Pumpkin Seed-Soy Germ (AZO BLADDER CONTROL/GO-LESS PO) Take 1 tablet by mouth in the morning and at bedtime.     Facility-Administered Medications Prior to Visit  Medication Dose Route Frequency Provider Last Rate Last Admin   omalizumab Arvid Right) injection 300 mg  300 mg Subcutaneous Q28 days [name], MD   300 mg at 12/06/20 1109     Per HPI unless specifically indicated in ROS section below Review of Systems  Constitutional:  Negative for chills and fever.  HENT:  Negative for congestion and ear pain.   Eyes:  Negative for pain and redness.  Respiratory:  Negative for cough and shortness of breath.   Cardiovascular:  Negative for chest pain, palpitations and leg swelling.  Gastrointestinal:  Negative for abdominal pain, blood in stool, constipation, diarrhea, nausea and vomiting.  Genitourinary:  Negative for dysuria.  Musculoskeletal:  Negative for myalgias.  Skin:  Negative for rash.  Neurological:  Negative for dizziness.  Psychiatric/Behavioral:  The patient is not nervous/anxious.   Objective:  BP 102/60   Pulse 89   Temp 97.6 F (36.4 C) (Temporal)   Ht 5' 3.5" (1.613 m)   Wt 209 lb (94.8 kg)   PF 97 L/min   BMI 36.44 kg/m   Wt Readings from Last 3 Encounters:  12/30/20 209 lb (94.8 kg)  12/22/20 201 lb (91.2 kg)  12/22/20 200 lb (90.7 kg)      Physical Exam Constitutional:      General: She is not in acute distress.    Appearance: Normal appearance. She is  well-developed. She is obese. She is not ill-appearing or toxic-appearing.  HENT:     Head: Normocephalic.     Right Ear: Hearing, tympanic membrane, ear canal and external ear normal.     Left Ear: Hearing, tympanic membrane, ear canal and external ear normal.     Nose: Nose normal.  Eyes:     General: Lids are normal. Lids are everted, no foreign bodies appreciated.     Conjunctiva/sclera: Conjunctivae normal.     Pupils: Pupils are equal, round, and reactive to light.  Neck:     Thyroid: No thyroid mass or thyromegaly.     Vascular: No carotid bruit.     Trachea: Trachea normal.  Cardiovascular:     Rate and Rhythm: Normal rate and regular rhythm.     Heart sounds: Normal heart sounds, S1 normal and S2 normal. No murmur heard.   No gallop.  Pulmonary:     Effort: Pulmonary effort is normal. No respiratory distress.     Breath sounds: Normal breath sounds. No wheezing, rhonchi or rales.  Abdominal:     General: Bowel sounds are normal. There is no distension or abdominal bruit.     Palpations: Abdomen is soft. There is no fluid wave or mass.     Tenderness: There is no abdominal tenderness. There is no guarding or rebound.     Hernia: No hernia is present.  Musculoskeletal:     Cervical back: Normal range of motion and neck supple.     Lumbar back: Tenderness and bony tenderness present. Decreased range of motion. Negative right straight leg raise test and negative left straight leg raise test.  Lymphadenopathy:     Cervical: No cervical adenopathy.  Skin:    General: Skin is warm and dry.  Findings: No rash.     Comments: Bilateral varicose veins   Neurological:     Mental Status: She is alert.     Cranial Nerves: No cranial nerve deficit.     Sensory: No sensory deficit.  Psychiatric:        Mood and Affect: Mood is not anxious or depressed.        Speech: Speech normal.        Behavior: Behavior normal. Behavior is cooperative.        Judgment: Judgment normal.       Results for orders placed or performed in visit on 12/15/20  TSH  Result Value Ref Range   TSH 4.07 0.35 - 4.50 uIU/mL  T4, free  Result Value Ref Range   Free T4 1.11 0.60 - 1.60 ng/dL  T3, free  Result Value Ref Range   T3, Free 2.6 2.3 - 4.2 pg/mL   *Note: Due to a large number of results and/or encounters for the requested time period, some results have not been displayed. A complete set of results can be found in Results Review.    This visit occurred during the SARS-CoV-2 public health emergency.  Safety protocols were in place, including screening questions prior to the visit, additional usage of staff PPE, and extensive cleaning of exam room while observing appropriate contact time as indicated for disinfecting solutions.   COVID 19 screen:  No recent travel or known exposure to COVID19 The patient denies respiratory symptoms of COVID 19 at this time. The importance of social distancing was discussed today.   Assessment and Plan    Problem List Items Addressed This Visit     Chronic low back pain without sciatica     Has seen Dr. Gladstone Lighter in past... continued chronic pain.  She would like referral for a second opinion.  requests: Dr. Beatris Ship   [facility]  [phone]        Relevant Orders   Ambulatory referral to Neurosurgery   Leg cramps - Primary     On less lasix , taking potassium. Wil check for low potassium.  Encouraged hydration. Recent thyroid check stable.Marland Kitchen ENDO decrease thyroid treatment.   ? If statin causing as SE... hold temporarily for trial.. restart if cramps not improved.       Relevant Orders   Basic metabolic panel   CBC with Differential/Platelet   Other Visit Diagnoses     History of discitis       Relevant Orders   Ambulatory referral to Neurosurgery        [name], MD

## 2020-12-30 NOTE — Patient Instructions (Signed)
December 30, 2020  Dear [name],  It was a pleasure meeting you during our initial appointment on December 27, 2020. Below is a summary of the goals we discussed and components of chronic care management. Please contact me anytime with questions or concerns.   Visit Information  Patient Care Plan: CCM Pharmacy Care Plan     Problem Identified: CHL AMB "PATIENT-SPECIFIC PROBLEM"      Long-Range Goal: Disease Management   Start Date: 12/30/2020  Priority: High  Note:   Current Barriers:  None identified  Pharmacist Clinical Goal(s):  Patient will contact provider office for questions/concerns as evidenced notation of same in electronic health record through collaboration with PharmD and provider.   Interventions: 1:1 collaboration with [name], MD regarding development and update of comprehensive plan of care as evidenced by provider attestation and co-signature Inter-disciplinary care team collaboration (see longitudinal plan of care) Comprehensive medication review performed; medication list updated in electronic medical record  Atrial Fibrillation (Goal: prevent stroke and major bleeding) -Controlled - managed by cardiology -Current treatment: Rate control: none  Anticoagulation: Xarelto 15 mg - 1 tablet daily with evening meal -Medications previously tried: none reported -Home BP and HR readings: not checking, she has a home BP monitor   -Xarelto - encouraged to take same time every day with largest meal. She is taking in morning at various times (8A-12P). Denies aspirin, aleve, or other NSAID use. The only thing she takes for pain is Tylenol.  -Counseled on importance of adherence to anticoagulant exactly as prescribed; bleeding risk associated with Xarelto and importance of self-monitoring for signs/symptoms of bleeding; avoidance of NSAIDs due to increased bleeding risk with anticoagulants; -Recommended to continue current medication  Osteoarthritis (Goal: Reduce  pain) -Not ideally controlled- per pt report -Pt reports chronic back  and knee pain - She applies salonpas to knee but difficulty reaching her back. She uses lidocaine spray on her back and it helps some, but not as much as the patch. Uses Tylenol PRN. -Current treatment  Tylenol 650 mg - 1 tablet daily as needed -Medications previously tried: multiple  -Recommended try scheduling the Tylenol with breakfast and supper. May add lunch as well if needed.  Patient Goals/Self-Care Activities Patient will:  - take medications as prescribed  Follow Up Plan: Telephone follow up appointment with care management team member scheduled for:  6 months      Patient verbalizes understanding of instructions provided today and agrees to view in [place].  Telephone follow up appointment with pharmacy team member scheduled for: 6 months  Debbora Dus, [facility]

## 2020-12-31 LAB — CBC WITH DIFFERENTIAL/PLATELET
Absolute Monocytes: 669 cells/uL (ref 200–950)
Basophils Absolute: 38 cells/uL (ref 0–200)
Basophils Relative: 0.5 %
Eosinophils Absolute: 167 cells/uL (ref 15–500)
Eosinophils Relative: 2.2 %
HCT: 38.9 % (ref 35.0–45.0)
Hemoglobin: 13 g/dL (ref 11.7–15.5)
Lymphs Abs: 2318 cells/uL (ref 850–3900)
MCH: 26.5 pg — ABNORMAL LOW (ref 27.0–33.0)
MCHC: 33.4 g/dL (ref 32.0–36.0)
MCV: 79.2 fL — ABNORMAL LOW (ref 80.0–100.0)
MPV: 9.4 fL (ref 7.5–12.5)
Monocytes Relative: 8.8 %
Neutro Abs: 4408 cells/uL (ref 1500–7800)
Neutrophils Relative %: 58 %
Platelets: 250 10*3/uL (ref 140–400)
RBC: 4.91 10*6/uL (ref 3.80–5.10)
RDW: 16 % — ABNORMAL HIGH (ref 11.0–15.0)
Total Lymphocyte: 30.5 %
WBC: 7.6 10*3/uL (ref 3.8–10.8)

## 2020-12-31 LAB — BASIC METABOLIC PANEL
BUN/Creatinine Ratio: 13 (calc) (ref 6–22)
BUN: 13 mg/dL (ref 7–25)
CO2: 26 mmol/L (ref 20–32)
Calcium: 9 mg/dL (ref 8.6–10.4)
Chloride: 106 mmol/L (ref 98–110)
Creat: 1.01 mg/dL — ABNORMAL HIGH (ref 0.60–0.93)
Glucose, Bld: 103 mg/dL — ABNORMAL HIGH (ref 65–99)
Potassium: 4.2 mmol/L (ref 3.5–5.3)
Sodium: 141 mmol/L (ref 135–146)

## 2021-01-02 ENCOUNTER — Telehealth: Payer: Self-pay | Admitting: Cardiovascular Disease

## 2021-01-02 DIAGNOSIS — J4550 Severe persistent asthma, uncomplicated: Secondary | ICD-10-CM | POA: Diagnosis not present

## 2021-01-02 NOTE — Telephone Encounter (Signed)
Thanks .. I agree with every day lasix - have her report back her weight and swelling next week.  Dr. Lemmie Evens (covering for Dr. Loletha Grayer)

## 2021-01-02 NOTE — Telephone Encounter (Signed)
Spoke with the patient who states that she has been having swelling in her feet. She states that her weight got up to 208lbs. Currently she is 205lbs. She states that her feet are swollen and tight. She has been taking Lasix 20 mg every other day. She denies any SOB. She states that she has not been elevating her feet. She denies any increased sodium in her diet however she does report that she has pre-packaged meals and has been eating out at [employer]. Educated patient on low sodium diet. Advised patient that she can take Lasix 20mg  once a day if she has increased swelling. Also advised patient to elevate her feet. Patient verbalized understanding.

## 2021-01-02 NOTE — Telephone Encounter (Signed)
Spoke with the patient and she will take Lasix daily and report back next week.

## 2021-01-02 NOTE — Telephone Encounter (Signed)
Pt c/o swelling: STAT is pt has developed SOB within 24 hours  If swelling, where is the swelling located? Right foot  How much weight have you gained and in what time span? 2 or 3 days; from 190's to 205 and over  Have you gained 3 pounds in a day or 5 pounds in a week? Yes   Do you have a log of your daily weights (if so, list)? Yes   Are you currently taking a fluid pill? Yes   Are you currently SOB? No   Have you traveled recently? Yes

## 2021-01-03 ENCOUNTER — Ambulatory Visit (INDEPENDENT_AMBULATORY_CARE_PROVIDER_SITE_OTHER): Payer: Medicare Other | Admitting: *Deleted

## 2021-01-03 ENCOUNTER — Other Ambulatory Visit: Payer: Self-pay

## 2021-01-03 DIAGNOSIS — J4550 Severe persistent asthma, uncomplicated: Secondary | ICD-10-CM | POA: Diagnosis not present

## 2021-01-05 ENCOUNTER — Telehealth: Payer: Self-pay | Admitting: Internal Medicine

## 2021-01-05 NOTE — Telephone Encounter (Signed)
Patient experiencing extreme hoarseness when speaking and singing and needs advice on how to help alleviate this problem or at least make it better.  Call back [phone]

## 2021-01-06 ENCOUNTER — Other Ambulatory Visit: Payer: Self-pay

## 2021-01-06 ENCOUNTER — Ambulatory Visit (INDEPENDENT_AMBULATORY_CARE_PROVIDER_SITE_OTHER): Payer: Medicare Other | Admitting: Podiatry

## 2021-01-06 ENCOUNTER — Encounter: Payer: Self-pay | Admitting: Podiatry

## 2021-01-06 DIAGNOSIS — M79676 Pain in unspecified toe(s): Secondary | ICD-10-CM | POA: Diagnosis not present

## 2021-01-06 DIAGNOSIS — Q828 Other specified congenital malformations of skin: Secondary | ICD-10-CM

## 2021-01-06 DIAGNOSIS — E1142 Type 2 diabetes mellitus with diabetic polyneuropathy: Secondary | ICD-10-CM | POA: Diagnosis not present

## 2021-01-06 DIAGNOSIS — B351 Tinea unguium: Secondary | ICD-10-CM

## 2021-01-08 NOTE — Progress Notes (Signed)
Subjective:  Patient ID: [name], female    DOB: 30-Apr-1946,  MRN: [id number]  75 y.o. female presents with at risk foot care with history of diabetic neuropathy and painful porokeratotic lesion(s) b/l feet and painful mycotic toenails that limit ambulation. Painful toenails interfere with ambulation. Aggravating factors include wearing enclosed shoe gear. Pain is relieved with periodic professional debridement. Painful porokeratotic lesions are aggravated when weightbearing with and without shoegear. Pain is relieved with periodic professional debridement..    Patient's blood sugar was 109 mg/dl on Tuesday. Patient does not monitor blood glucose on a daily basis.  PCP: [name], MD and last visit was: 12/30/2020.  Review of Systems: Negative except as noted in the HPI.   Allergies  Allergen Reactions   Latex Hives   Penicillins Swelling    Has patient had a PCN reaction causing immediate rash, facial/tongue/throat swelling, SOB or lightheadedness with hypotension patient had a PCN reaction causing severe rash involving mucus membranes or skin necrosis: [id number] Has patient had a PCN reaction that required hospitalization/No Has patient had a PCN reaction occurring within the last 10 years: No If all of the above answers are "NO", then may proceed with Cephalosporin use.      Oxycodone-Acetaminophen Nausea And Vomiting   Lyrica [Pregabalin] Other (See Comments)    Blurred vision.   Nickel Rash   Phenytoin Sodium Extended Swelling   Vicodin [Hydrocodone-Acetaminophen] Nausea And Vomiting    Objective:  There were no vitals filed for this visit. Constitutional Patient is a pleasant 75 y.o. African American female in NAD. AAO x 3.  Vascular Capillary fill time to digits <3 seconds b/l lower extremities. Palpable DP pulse(s) b/l lower extremities Palpable PT pulse(s) b/l lower extremities Pedal hair sparse. Lower extremity skin temperature gradient within normal  limits. No pain with calf compression b/l. No cyanosis or clubbing noted.  Neurologic Normal speech. Pt has subjective symptoms of neuropathy. Protective sensation decreased with 10 gram monofilament b/l.  Dermatologic Pedal skin with normal turgor, texture and tone b/l lower extremities No open wounds b/l lower extremities No interdigital macerations b/l lower extremities Toenails 2-5 bilaterally elongated, discolored, dystrophic, thickened, and crumbly with subungual debris and tenderness to dorsal palpation. Anonychia noted L hallux and R hallux. Nailbed(s) epithelialized.  Porokeratotic lesion(s) R hallux, submet head 5 left foot, submet head 5 right foot, and sub 5th met base b/l feet. No erythema, no edema, no drainage, no fluctuance.  Orthopedic: Normal muscle strength 5/5 to all lower extremity muscle groups bilaterally. No pain crepitus or joint limitation noted with ROM b/l. Hammertoe(s) noted to the 2-5 bilaterally.   Hemoglobin A1C Latest Ref Rng & Units 04/20/2020  HGBA1C 4.6 - 6.5 % 6.2  Some recent data might be hidden   Assessment:   1. Pain due to onychomycosis of toenail   2. Porokeratosis   3. Diabetic polyneuropathy associated with type 2 diabetes mellitus ([place])    Plan:  Patient was evaluated and treated and all questions answered.  Onychomycosis with pain -Nails palliatively debridement as below. -Educated on self-care  Procedure: Nail Debridement Rationale: Pain Type of Debridement: manual, sharp debridement. Instrumentation: Nail nipper, rotary burr. Number of Nails: 8  -Examined patient. -Continue diabetic foot care principles. -Patient to continue soft, supportive shoe gear daily. -Toenails 2-5 bilaterally debrided in length and girth without iatrogenic bleeding with sterile nail nipper and dremel.  -Painful porokeratotic lesion(s) R hallux, submet head 5 left foot, submet head 5 right foot, and sub 5th  met base b/l feet pared and enucleated with sterile  scalpel blade without incident. Total number of lesions debrided=5. -Patient to report any pedal injuries to medical professional immediately. -Patient/POA to call should there be question/concern in the interim.  Return in about 3 months (around 04/08/2021).  Marzetta Board, DPM

## 2021-01-09 ENCOUNTER — Other Ambulatory Visit: Payer: Self-pay | Admitting: Family Medicine

## 2021-01-09 ENCOUNTER — Telehealth: Payer: Self-pay | Admitting: Cardiovascular Disease

## 2021-01-09 DIAGNOSIS — E559 Vitamin D deficiency, unspecified: Secondary | ICD-10-CM

## 2021-01-09 DIAGNOSIS — R5383 Other fatigue: Secondary | ICD-10-CM

## 2021-01-09 DIAGNOSIS — E538 Deficiency of other specified B group vitamins: Secondary | ICD-10-CM

## 2021-01-09 NOTE — Telephone Encounter (Signed)
Follow up:     Patient calling to report that her weight has gone this morning it was 200.

## 2021-01-09 NOTE — Telephone Encounter (Signed)
Pt called stating since taking lasix 20 mg daily as instructed, her weight has decreased from 205 to now 200. She state her right foot is still a little swollen, but much better. Pt state she will continue with current dose and report back next week.   Will forward to MD to make aware.

## 2021-01-10 DIAGNOSIS — M65331 Trigger finger, right middle finger: Secondary | ICD-10-CM | POA: Diagnosis not present

## 2021-01-10 DIAGNOSIS — M65341 Trigger finger, right ring finger: Secondary | ICD-10-CM | POA: Diagnosis not present

## 2021-01-12 ENCOUNTER — Other Ambulatory Visit (INDEPENDENT_AMBULATORY_CARE_PROVIDER_SITE_OTHER): Payer: Medicare Other

## 2021-01-12 ENCOUNTER — Other Ambulatory Visit: Payer: Self-pay

## 2021-01-12 DIAGNOSIS — E538 Deficiency of other specified B group vitamins: Secondary | ICD-10-CM

## 2021-01-12 DIAGNOSIS — E559 Vitamin D deficiency, unspecified: Secondary | ICD-10-CM

## 2021-01-12 LAB — VITAMIN B12: Vitamin B-12: 776 pg/mL (ref 211–911)

## 2021-01-12 LAB — VITAMIN D 25 HYDROXY (VIT D DEFICIENCY, FRACTURES): VITD: 65.41 ng/mL (ref 30.00–100.00)

## 2021-01-12 NOTE — Telephone Encounter (Signed)
Called and lvm for patient to call back. 

## 2021-01-18 ENCOUNTER — Other Ambulatory Visit (INDEPENDENT_AMBULATORY_CARE_PROVIDER_SITE_OTHER): Payer: Medicare Other

## 2021-01-18 ENCOUNTER — Other Ambulatory Visit: Payer: Self-pay

## 2021-01-18 ENCOUNTER — Telehealth: Payer: Self-pay

## 2021-01-18 DIAGNOSIS — E0500 Thyrotoxicosis with diffuse goiter without thyrotoxic crisis or storm: Secondary | ICD-10-CM | POA: Diagnosis not present

## 2021-01-18 LAB — T3, FREE: T3, Free: 3 pg/mL (ref 2.3–4.2)

## 2021-01-18 LAB — TSH: TSH: 1.23 u[IU]/mL (ref 0.35–5.50)

## 2021-01-18 LAB — T4, FREE: Free T4: 1.03 ng/dL (ref 0.60–1.60)

## 2021-01-18 NOTE — Telephone Encounter (Addendum)
Called and left a detailed message. ----- Message from Philemon Kingdom, MD sent at 01/18/2021  2:35 PM EDT ----- Can you please call pt.:  Her thyroid tests are all normal.  Please continue to stay off methimazole. We will recheck the tests when she returns in October.

## 2021-01-19 ENCOUNTER — Telehealth: Payer: Self-pay | Admitting: Adult Health

## 2021-01-19 NOTE — Telephone Encounter (Signed)
Pt is asking for a call to discuss that [place] left [facility] preferred Network,

## 2021-01-19 NOTE — Telephone Encounter (Signed)
Called and left a detailed message for pt to d/c Methimazole.

## 2021-01-19 NOTE — Telephone Encounter (Addendum)
Pt states she received a letter stating on January 10, 2021 [facility] left the [facility] preferred plan network. She has not contact [place] yet.  I advised the patient to contact [facility] first as they may need to review the letter or discuss personally with insurance.  Option to switch companies later if needed but she should discuss with [place] first.  She verbalized understanding and appreciation and will let us know if there is anything we need to help with.

## 2021-01-19 NOTE — Telephone Encounter (Signed)
Pt called and advised she is currently taking Methimazole 2.5 every other day. Should pt continue current regimine or discontinue until appt?

## 2021-01-20 ENCOUNTER — Telehealth: Payer: Self-pay | Admitting: Cardiovascular Disease

## 2021-01-20 NOTE — Telephone Encounter (Signed)
pt states that she took her blood thinner last night and had thrown up, pt was also dizzy. Pt would like to know if it is ok to take another one since she'd thrown up so much and had probably passed the pill as well. Pt states while speaking with on call nurse last night she was also asked about her history of afib.. pt is not familiar with what that is and would like that to be explained to her as well.

## 2021-01-20 NOTE — Telephone Encounter (Signed)
Discussed the patients past medical history including Afib/ Flutter and blood clot. Educated about Afib/Flutter, Xarelto and prevention of stroke. Advised patient to take medication as scheduled this evening. She is doing much better today and feels the upset stomach was from the seafood she ate.  Patient verbalized understanding.

## 2021-01-23 ENCOUNTER — Telehealth: Payer: Self-pay

## 2021-01-23 NOTE — Telephone Encounter (Signed)
Pt is calling back to informDr. Croitoru that she is still continuing to vomit after she takes her night time meds, and now she is having diarrhea since Saturday. Please advise pt further

## 2021-01-23 NOTE — Telephone Encounter (Signed)
Returned call to patient, she states she ate at [employer] Thursday evening and started vomiting after.  She believes this is due to food poisoning.  This has continued and now has diarrhea.  Last vomited Saturday night.  She is trying to stay hydrated drinking h20 and ginger ale.   She has been taking pepto bismol and alka seltzer.   She feels a little better today.   She has follow up with her PCP tomorrow.   Advised to keep appt with PCP and call with any questions or concerns after appt.  Patient verbalized understanding.

## 2021-01-23 NOTE — Telephone Encounter (Addendum)
See note below access nurse. Pt already has scheduled appt with Dr Diona Browner on 01/24/21 at 2:20.     [place] Night - Client Nonclinical Telephone Record AccessNurse Client [place] Night - Client Client Site [facility] - Night Physician [name] - MD Contact Type Call Who Is Calling Patient / Member / Family / Caregiver Caller Name [place] Phone Number [phone] Patient Name [name] Patient DOB 06-02-1946 Call Type Message Only Information Provided Reason for Call Request to Schedule Office Appointment Initial Comment Caller states she is calling to schedule an appointment. Patient request to speak to RN No Additional Comment Caller declined triage, provided office hours to caller. Disp. Time Disposition Final User 01/23/2021 8:12:14 AM General Information Provided Yes Mockler, Tiffany Call Closed By: Marlou Sa Transaction Date/Time: 01/23/2021 8:09:44 AM (ET)

## 2021-01-23 NOTE — Telephone Encounter (Signed)
Patient states over the weekend she developed some vomiting, diarrhea, upset stomach; patient states she thinks it is from eating a different seafood dish from her normal. She is able to keep fluids and some food down now. No fever. Patient has not noticed any blood in her stools or vomit. Patient is scheduled for 01/24/21 in office. Is it ok for in person visit?

## 2021-01-23 NOTE — Telephone Encounter (Signed)
Okay for in person visit.

## 2021-01-23 NOTE — Telephone Encounter (Signed)
Noted. Thank you. I told patient earlier to come in the office for the visit and we would call her back if that needs to be changed to virtual only.

## 2021-01-24 ENCOUNTER — Other Ambulatory Visit: Payer: Self-pay | Admitting: Allergy and Immunology

## 2021-01-24 ENCOUNTER — Ambulatory Visit (INDEPENDENT_AMBULATORY_CARE_PROVIDER_SITE_OTHER): Payer: Medicare Other | Admitting: Family Medicine

## 2021-01-24 ENCOUNTER — Encounter: Payer: Self-pay | Admitting: Family Medicine

## 2021-01-24 ENCOUNTER — Other Ambulatory Visit: Payer: Self-pay

## 2021-01-24 VITALS — BP 102/68 | HR 70 | Temp 98.0°F | Ht 63.5 in | Wt 199.5 lb

## 2021-01-24 DIAGNOSIS — K529 Noninfective gastroenteritis and colitis, unspecified: Secondary | ICD-10-CM | POA: Diagnosis not present

## 2021-01-24 DIAGNOSIS — E1140 Type 2 diabetes mellitus with diabetic neuropathy, unspecified: Secondary | ICD-10-CM

## 2021-01-24 NOTE — Assessment & Plan Note (Signed)
Has not been checking BS lately.  Lab Results  Component Value Date   HGBA1C 6.2 04/20/2020

## 2021-01-24 NOTE — Progress Notes (Signed)
Patient ID: [name], female    DOB: 07/08/1946, 75 y.o.   MRN: [id number]  This visit was conducted in person.  BP 102/68   Pulse 70   Temp 98 F (36.7 C) (Temporal)   Ht 5' 3.5" (1.613 m)   Wt 199 lb 8 oz (90.5 kg)   SpO2 96%   BMI 34.79 kg/m    CC: Chief Complaint  Patient presents with   Diarrhea   Vomiting    Started Thursday night after eating at [employer]    Subjective:   HPI: [name] is a 75 y.o. female presenting on 01/24/2021 for Diarrhea and Vomiting (Started Thursday night after eating at [employer])   She reports  after eating at [employer] for lunch ( shrimp, squid and salad) on 7/7  Several hours later she started having  nausea, emesis and  abdominal cramping. 2 days later had diarrhea.  Used alka seltzer, ginger ale and peptobismol.  Yesterday started feeling better.   No further in last 2 days.  In last 2 days no fever, no emesis. No abdominal pain.  Has been able to now keep down  water and ginger ale 4 x 16 oz a day. Minimal food. Has been holding lasix and potassium.  BP Readings from Last 3 Encounters:  01/24/21 102/68  12/30/20 102/60  12/22/20 102/76       Relevant past medical, surgical, family and social history reviewed and updated as indicated. Interim medical history since our last visit reviewed. Allergies and medications reviewed and updated. Outpatient Medications Prior to Visit  Medication Sig Dispense Refill   acetaminophen (TYLENOL) 650 MG CR tablet Take 1,300 mg by mouth daily as needed for pain.     Acetaminophen-Codeine 300-30 MG tablet Take 1 tablet by mouth every 6 (six) hours as needed for pain.     ADVAIR HFA 230-21 MCG/ACT inhaler INHALE 2 PUFFS BY MOUTH TWICE DAILY TO PREVENT COUGH OR WHEEZING, RINSE MOUTH AFTER USE 12 g 4   albuterol (VENTOLIN HFA) 108 (90 Base) MCG/ACT inhaler INHALE 2 PUFFS BY MOUTH EVERY 6 HOURS AS NEEDED FOR WHEEZING OR SHORTNESS OF BREATH 54 g 0   atorvastatin (LIPITOR) 10 MG  tablet TAKE 1 TABLET(10 MG) BY MOUTH DAILY 90 tablet 1   Blood Glucose Monitoring Suppl (ONETOUCH VERIO) w/Device KIT Use to check blood sugar up to 2 times a day 1 kit 0   Coenzyme Q10 (COQ-10) 100 MG CAPS Take 100 mg by mouth daily.     cyclobenzaprine (FLEXERIL) 10 MG tablet TAKE 1 TABLET(10 MG) BY MOUTH AT BEDTIME AS NEEDED FOR MUSCLE SPASMS 30 tablet 1   dextromethorphan-guaiFENesin (MUCINEX DM) 30-600 MG 12hr tablet Take 1 tablet by mouth 2 (two) times daily.     DM-APAP-CPM (CORICIDIN HBP PO) Take 1 tablet by mouth daily as needed (cough/cold).     EPINEPHrine 0.3 mg/0.3 mL IJ SOAJ injection USE AS DIRECTED FOR LIFE THREATENING ALLERGIC REACTIONS 2 each 2   Ferrous Sulfate (IRON) 325 (65 Fe) MG TABS Take 325 mg by mouth daily.     fluticasone (FLOVENT HFA) 220 MCG/ACT inhaler INHALE 2 PUFFS BY MOUTH TWICE DAILY DURING FLARE UP AS NEEDED. RINSE, GARGLE AND SPIT AFTER USE 12 g 1   furosemide (LASIX) 20 MG tablet Take 1 tablet (20 mg total) by mouth as needed for fluid (Take one daily as needed for swelling). 90 tablet 3   gabapentin (NEURONTIN) 600 MG tablet TAKE 1 TABLET  BY MOUTH DAILY AT BREAKFAST, 1 TABLET AT LUNCH, AND 2 TABLETS AT BEDTIME 811 tablet 1   GARLIC PO Take 1 tablet by mouth daily.     GLYXAMBI 10-5 MG TABS TAKE 1 TABLET BY MOUTH DAILY 30 tablet 11   Homeopathic Products (THERAWORX RELIEF EX) Apply 1 spray topically daily as needed (muscle cramps).     hydrOXYzine (ATARAX/VISTARIL) 10 MG tablet Take 1 tablet (10 mg total) by mouth daily as needed for itching. 30 tablet 2   ipratropium (ATROVENT) 0.06 % nasal spray USE 1 TO 2 SPRAYS IN EACH NOSTRIL 1 TO 2 TIMES PER DAY 15 mL 3   irbesartan (AVAPRO) 150 MG tablet TAKE 1 TABLET(150 MG) BY MOUTH DAILY 30 tablet 8   Lactase (DAIRY-RELIEF PO) Take 1 capsule by mouth daily as needed (eating dairy).      Lancets (ONETOUCH DELICA PLUS [id number]) MISC USE TO CHECK BLOOD SUGAR UP TO TWICE DAILY 100 each 5   levalbuterol (XOPENEX) 1.25  MG/3ML nebulizer solution USE 1 VIAL VIA NEBULIZER EVERY 6 HOURS AS NEEDED FOR SHORTNESS OF BREATH OR WHEEZING 900 mL 1   liver oil-zinc oxide (DESITIN) 40 % ointment Apply 1 application topically as needed for irritation.     meclizine (ANTIVERT) 12.5 MG tablet Take 1 tablet (12.5 mg total) by mouth 3 (three) times daily as needed for dizziness. 15 tablet 0   Menthol-Methyl Salicylate (SALONPAS JET SPRAY EX) Apply 1 spray topically daily as needed (knee pain).     methimazole (TAPAZOLE) 5 MG tablet Take 0.5 tablets (2.5 mg total) by mouth every other day. 45 tablet 3   Misc Natural Products (NEURIVA PO) Take 2 tablets by mouth daily.     montelukast (SINGULAIR) 10 MG tablet TAKE 1 TABLET(10 MG) BY MOUTH DAILY 90 tablet 1   omalizumab (XOLAIR) 150 MG injection Inject 300 mg into the skin every 28 (twenty-eight) days.     ondansetron (ZOFRAN-ODT) 4 MG disintegrating tablet Take 4 mg by mouth every 8 (eight) hours as needed for nausea or vomiting.      ONETOUCH VERIO test strip USE TO CHECK BLOOD SUGAR UP TO TWICE DAILY 100 strip 5   OVER THE COUNTER MEDICATION Take 1 capsule by mouth daily. Bladder wak     OVER THE COUNTER MEDICATION Take 1 capsule by mouth daily. [employer]     OVER THE COUNTER MEDICATION Take 1 tablet by mouth daily. 7 support joint and comfort     pantoprazole (PROTONIX) 40 MG tablet TAKE 1 TABLET(40 MG) BY MOUTH DAILY 90 tablet 1   PATADAY 0.2 % SOLN Place 1 drop into both eyes daily as needed (allergies).   4   Polyethyl Glycol-Propyl Glycol (SYSTANE OP) Place 1 drop into both eyes in the morning and at bedtime.     potassium chloride SA (KLOR-CON) 20 MEQ tablet Take 1 tablet (20 mEq total) by mouth as needed (Take one daily when you need to take a Furosemide). 30 tablet 0   Tiotropium Bromide Monohydrate (SPIRIVA RESPIMAT) 1.25 MCG/ACT AERS Inhale 2 puffs into the lungs daily. 4 g 5   TRULANCE 3 MG TABS Take 3 mg by mouth every other day.     Turmeric 500 MG CAPS Take  1,000 mg by mouth daily.     XARELTO 20 MG TABS tablet TAKE 1 TABLET(20 MG) BY MOUTH DAILY WITH SUPPER 90 tablet 1   Facility-Administered Medications Prior to Visit  Medication Dose Route Frequency Provider Last Rate Last  Admin   omalizumab Arvid Right) injection 300 mg  300 mg Subcutaneous Q28 days Kozlow, Donnamarie Poag, MD   300 mg at 01/03/21 1155     Per HPI unless specifically indicated in ROS section below Review of Systems  Constitutional:  Negative for fatigue and fever.  HENT:  Negative for congestion.   Eyes:  Negative for pain.  Respiratory:  Negative for cough and shortness of breath.   Cardiovascular:  Negative for chest pain, palpitations and leg swelling.  Gastrointestinal:  Negative for abdominal pain.  Genitourinary:  Negative for dysuria and vaginal bleeding.  Musculoskeletal:  Negative for back pain.  Neurological:  Negative for syncope, light-headedness and headaches.  Psychiatric/Behavioral:  Negative for dysphoric mood.   Objective:  BP 102/68   Pulse 70   Temp 98 F (36.7 C) (Temporal)   Ht 5' 3.5" (1.613 m)   Wt 199 lb 8 oz (90.5 kg)   SpO2 96%   BMI 34.79 kg/m   Wt Readings from Last 3 Encounters:  01/24/21 199 lb 8 oz (90.5 kg)  12/30/20 209 lb (94.8 kg)  12/22/20 201 lb (91.2 kg)      Physical Exam Constitutional:      General: She is not in acute distress.    Appearance: Normal appearance. She is well-developed. She is not ill-appearing or toxic-appearing.  HENT:     Head: Normocephalic.     Right Ear: Hearing, tympanic membrane, ear canal and external ear normal. Tympanic membrane is not erythematous, retracted or bulging.     Left Ear: Hearing, tympanic membrane, ear canal and external ear normal. Tympanic membrane is not erythematous, retracted or bulging.     Nose: No mucosal edema or rhinorrhea.     Right Sinus: No maxillary sinus tenderness or frontal sinus tenderness.     Left Sinus: No maxillary sinus tenderness or frontal sinus tenderness.      Mouth/Throat:     Pharynx: Uvula midline.  Eyes:     General: Lids are normal. Lids are everted, no foreign bodies appreciated.     Conjunctiva/sclera: Conjunctivae normal.     Pupils: Pupils are equal, round, and reactive to light.  Neck:     Thyroid: No thyroid mass or thyromegaly.     Vascular: No carotid bruit.     Trachea: Trachea normal.  Cardiovascular:     Rate and Rhythm: Normal rate and regular rhythm.     Pulses: Normal pulses.     Heart sounds: Normal heart sounds, S1 normal and S2 normal. No murmur heard.   No friction rub. No gallop.  Pulmonary:     Effort: Pulmonary effort is normal. No tachypnea or respiratory distress.     Breath sounds: Normal breath sounds. No decreased breath sounds, wheezing, rhonchi or rales.  Abdominal:     General: Bowel sounds are normal.     Palpations: Abdomen is soft.     Tenderness: There is no abdominal tenderness.  Musculoskeletal:     Cervical back: Normal range of motion and neck supple.  Skin:    General: Skin is warm and dry.     Findings: No rash.  Neurological:     Mental Status: She is alert.  Psychiatric:        Mood and Affect: Mood is not anxious or depressed.        Speech: Speech normal.        Behavior: Behavior normal. Behavior is cooperative.        Thought Content: Thought  content normal.        Judgment: Judgment normal.      Results for orders placed or performed in visit on 01/18/21  TSH  Result Value Ref Range   TSH 1.23 0.35 - 5.50 uIU/mL  T4, free  Result Value Ref Range   Free T4 1.03 0.60 - 1.60 ng/dL  T3, free  Result Value Ref Range   T3, Free 3.0 2.3 - 4.2 pg/mL   *Note: Due to a large number of results and/or encounters for the requested time period, some results have not been displayed. A complete set of results can be found in Results Review.    This visit occurred during the SARS-CoV-2 public health emergency.  Safety protocols were in place, including screening questions prior to the  visit, additional usage of staff PPE, and extensive cleaning of exam room while observing appropriate contact time as indicated for disinfecting solutions.   COVID 19 screen:  No recent travel or known exposure to COVID19 The patient denies respiratory symptoms of COVID 19 at this time. The importance of social distancing was discussed today.   Assessment and Plan Problem List Items Addressed This Visit     Diabetes mellitus with neuropathy ([place])    Has not been checking BS lately.  Lab Results  Component Value Date   HGBA1C 6.2 04/20/2020         Gastroenteritis - Primary    Likely viral vs toxin. Now resolving. Continue to push fluids.  Slowly add back bland foods.. chicken soup..  Can drink gatorade, water ginger ale.   Please stop at the lab to have labs drawn... to re-eval K and Ka as well as renal function.       Relevant Orders   Basic Metabolic Panel       [name], MD

## 2021-01-24 NOTE — Assessment & Plan Note (Signed)
Likely viral vs toxin. Now resolving. Continue to push fluids.  Slowly add back bland foods.. chicken soup..  Can drink gatorade, water ginger ale.   Please stop at the lab to have labs drawn... to re-eval K and Ka as well as renal function.

## 2021-01-24 NOTE — Patient Instructions (Signed)
Continue to push fluids.  Slowly add back bland foods.. chicken soup..  Can drink gatorade, water ginger ale.   Please stop at the lab to have labs drawn.

## 2021-01-25 ENCOUNTER — Telehealth: Payer: Self-pay | Admitting: Family Medicine

## 2021-01-25 ENCOUNTER — Other Ambulatory Visit: Payer: Self-pay | Admitting: Allergy and Immunology

## 2021-01-25 LAB — BASIC METABOLIC PANEL
BUN: 7 mg/dL (ref 6–23)
CO2: 29 mEq/L (ref 19–32)
Calcium: 9.4 mg/dL (ref 8.4–10.5)
Chloride: 106 mEq/L (ref 96–112)
Creatinine, Ser: 1.05 mg/dL (ref 0.40–1.20)
GFR: 52.35 mL/min — ABNORMAL LOW (ref >60.00)
Glucose, Bld: 101 mg/dL — ABNORMAL HIGH (ref 70–99)
Potassium: 4 mEq/L (ref 3.5–5.1)
Sodium: 142 mEq/L (ref 135–145)

## 2021-01-25 NOTE — Telephone Encounter (Signed)
Pt wanted to let Dr. Diona Browner know that her diarrhea has started again, she would like a call back to discuss

## 2021-01-25 NOTE — Telephone Encounter (Addendum)
Spoke with [name].  She states after she ate her soup last night, she had diarrhea and again this morning. She states this is happening after she eats. She states her stomach is growling and feels tight.  Today she had a headache.  She continues to sip fluid all through out the day to try and stay hydrated.  She is asking if there is some she should be taking or what she needs to do.  Please advise.

## 2021-01-25 NOTE — Telephone Encounter (Signed)
She needs to continue eating balnd foods, push fluids ang given it more time. If not improving by Friday 7/15... we can have her have further testing.   See labs results as well.

## 2021-01-26 DIAGNOSIS — R198 Other specified symptoms and signs involving the digestive system and abdomen: Secondary | ICD-10-CM | POA: Diagnosis not present

## 2021-01-26 NOTE — Telephone Encounter (Signed)
[name] notified as instructed by telephone.  She states she saw her GI today and they advised her pretty much the same thing.  She was advised to contact them if not improving by Monday.  FYI to Dr. Diona Browner.

## 2021-01-26 NOTE — Telephone Encounter (Signed)
Left message for [name] to return my call.

## 2021-01-27 ENCOUNTER — Encounter: Payer: Self-pay | Admitting: Family Medicine

## 2021-01-27 DIAGNOSIS — Z1231 Encounter for screening mammogram for malignant neoplasm of breast: Secondary | ICD-10-CM | POA: Diagnosis not present

## 2021-01-27 DIAGNOSIS — Z803 Family history of malignant neoplasm of breast: Secondary | ICD-10-CM | POA: Diagnosis not present

## 2021-01-30 DIAGNOSIS — J4550 Severe persistent asthma, uncomplicated: Secondary | ICD-10-CM | POA: Diagnosis not present

## 2021-01-31 ENCOUNTER — Ambulatory Visit (INDEPENDENT_AMBULATORY_CARE_PROVIDER_SITE_OTHER): Payer: Medicare Other

## 2021-01-31 ENCOUNTER — Other Ambulatory Visit: Payer: Self-pay

## 2021-01-31 DIAGNOSIS — M2042 Other hammer toe(s) (acquired), left foot: Secondary | ICD-10-CM | POA: Diagnosis not present

## 2021-01-31 DIAGNOSIS — E1142 Type 2 diabetes mellitus with diabetic polyneuropathy: Secondary | ICD-10-CM

## 2021-01-31 DIAGNOSIS — J4550 Severe persistent asthma, uncomplicated: Secondary | ICD-10-CM

## 2021-01-31 DIAGNOSIS — M2041 Other hammer toe(s) (acquired), right foot: Secondary | ICD-10-CM | POA: Diagnosis not present

## 2021-01-31 NOTE — Progress Notes (Signed)
Patient in office today to pick-up diabetic shoes. Patient tried on the shoes and inserts and was satisfied with the fit. Patient was educated on the break-in process and was advised to call the office with any questions, comments, or concerns. Patient verbalized understanding.

## 2021-02-07 DIAGNOSIS — M65341 Trigger finger, right ring finger: Secondary | ICD-10-CM | POA: Diagnosis not present

## 2021-02-07 DIAGNOSIS — M65841 Other synovitis and tenosynovitis, right hand: Secondary | ICD-10-CM | POA: Diagnosis not present

## 2021-02-08 ENCOUNTER — Ambulatory Visit (INDEPENDENT_AMBULATORY_CARE_PROVIDER_SITE_OTHER): Payer: Medicare Other | Admitting: Podiatry

## 2021-02-08 ENCOUNTER — Other Ambulatory Visit: Payer: Self-pay

## 2021-02-08 ENCOUNTER — Telehealth: Payer: Self-pay | Admitting: Allergy and Immunology

## 2021-02-08 DIAGNOSIS — M2041 Other hammer toe(s) (acquired), right foot: Secondary | ICD-10-CM

## 2021-02-08 DIAGNOSIS — M2042 Other hammer toe(s) (acquired), left foot: Secondary | ICD-10-CM

## 2021-02-08 DIAGNOSIS — E1142 Type 2 diabetes mellitus with diabetic polyneuropathy: Secondary | ICD-10-CM

## 2021-02-08 NOTE — Progress Notes (Signed)
Patient presented to the office today stating that her heels were slipping in the back of the shoes and wanted to get another pair in black (822) due to patient's feet are a narrow.  Patient will be contacted when the shoes are ready for pick up

## 2021-02-08 NOTE — Telephone Encounter (Signed)
Patient is requesting a call back directly from Dr. Neldon Mc regarding her spacer for her inhaler.

## 2021-02-08 NOTE — Telephone Encounter (Signed)
Called and left a voicemail for the patient asking for her to return call to discuss.

## 2021-02-09 DIAGNOSIS — N3941 Urge incontinence: Secondary | ICD-10-CM | POA: Diagnosis not present

## 2021-02-09 DIAGNOSIS — R351 Nocturia: Secondary | ICD-10-CM | POA: Diagnosis not present

## 2021-02-10 NOTE — Telephone Encounter (Signed)
Called and spoke to the patient and she stated that when she was charged by [employer] for a spacer for $33.89. She spoke with Dr. Neldon Mc back in May and he stated that she shouldn't have to pay anything for the spacer and that insurance should cover it. She called and spoke with [employer] and they stated that they would send her a check back for the amount of $33.89. She stated that in May she spoke with them and they stated that the check had been mailed out but she never received it. She stated that she called them in June and they stated that they were sending out another check for that amount but she has yet to receive anything. I advised to the patient that we would reach out and speak to [employer] and try to see if we can find anything out regarding the situation. Patient verbalized understanding.

## 2021-02-13 ENCOUNTER — Ambulatory Visit: Payer: Medicare Other | Admitting: Podiatry

## 2021-02-16 ENCOUNTER — Other Ambulatory Visit: Payer: Self-pay | Admitting: Allergy and Immunology

## 2021-02-17 ENCOUNTER — Other Ambulatory Visit: Payer: Self-pay | Admitting: Allergy and Immunology

## 2021-02-20 ENCOUNTER — Other Ambulatory Visit: Payer: Self-pay | Admitting: Allergy and Immunology

## 2021-02-23 ENCOUNTER — Other Ambulatory Visit: Payer: Self-pay | Admitting: *Deleted

## 2021-02-24 ENCOUNTER — Telehealth: Payer: Self-pay | Admitting: *Deleted

## 2021-02-24 NOTE — Telephone Encounter (Signed)
PA has been submitted through [facility] for Pantoprazole and is currently pending approval/denial.

## 2021-02-26 ENCOUNTER — Other Ambulatory Visit: Payer: Self-pay | Admitting: Cardiovascular Disease

## 2021-02-27 ENCOUNTER — Telehealth: Payer: Self-pay | Admitting: Cardiovascular Disease

## 2021-02-27 NOTE — Telephone Encounter (Signed)
Prescription refill request for Xarelto received.  Indication:afib Last office visit: croitoru 12/22/20 Weight:90.5kg Age:31fScr:1.05 01/24/21 CrCl:67.2

## 2021-02-27 NOTE — Telephone Encounter (Signed)
PA has been approved. Form has been faxed to patient's pharmacy, labeled, and placed in bulk scanning.

## 2021-02-28 ENCOUNTER — Ambulatory Visit: Payer: Self-pay

## 2021-02-28 DIAGNOSIS — M170 Bilateral primary osteoarthritis of knee: Secondary | ICD-10-CM | POA: Diagnosis not present

## 2021-03-07 ENCOUNTER — Ambulatory Visit: Payer: Medicare Other | Admitting: Family Medicine

## 2021-03-08 ENCOUNTER — Other Ambulatory Visit: Payer: Self-pay | Admitting: Allergy and Immunology

## 2021-03-09 DIAGNOSIS — Z01818 Encounter for other preprocedural examination: Secondary | ICD-10-CM | POA: Diagnosis not present

## 2021-03-09 DIAGNOSIS — E119 Type 2 diabetes mellitus without complications: Secondary | ICD-10-CM | POA: Diagnosis not present

## 2021-03-10 ENCOUNTER — Ambulatory Visit (INDEPENDENT_AMBULATORY_CARE_PROVIDER_SITE_OTHER): Payer: Medicare Other

## 2021-03-10 ENCOUNTER — Other Ambulatory Visit: Payer: Self-pay

## 2021-03-10 DIAGNOSIS — E1142 Type 2 diabetes mellitus with diabetic polyneuropathy: Secondary | ICD-10-CM

## 2021-03-10 NOTE — Progress Notes (Signed)
Patient in office today to pick-up diabetic shoes. Patient tried on a size 12 medium shoe and a size 12 narrow shoe. Both shoes were too big for patient. New shoes will be ordered in a 11 medium women's for a better fit. Patient agrees with new order. Patient advised that she will get a call from the office when new shoes are ready for pick-up. Patient has custom inserts in her possession.

## 2021-03-14 ENCOUNTER — Ambulatory Visit (INDEPENDENT_AMBULATORY_CARE_PROVIDER_SITE_OTHER): Payer: Medicare Other | Admitting: Family Medicine

## 2021-03-14 ENCOUNTER — Other Ambulatory Visit: Payer: Self-pay

## 2021-03-14 ENCOUNTER — Encounter: Payer: Self-pay | Admitting: Family Medicine

## 2021-03-14 VITALS — BP 100/60 | HR 57 | Temp 98.2°F | Ht 64.0 in | Wt 204.5 lb

## 2021-03-14 DIAGNOSIS — M79622 Pain in left upper arm: Secondary | ICD-10-CM | POA: Diagnosis not present

## 2021-03-14 MED ORDER — DICLOFENAC SODIUM 3 % EX CREA: TOPICAL_CREAM | CUTANEOUS | 1 refills | Status: DC

## 2021-03-14 NOTE — Assessment & Plan Note (Signed)
Possibly SE/inflammation from COVID booster ( no Sign of infection or allergic response) vs muscle irritation vs rotator cuff tendonitis vs bursitis   Will treat with topical NSAID,and home PT. If not improving consider joint issues more strongly.

## 2021-03-14 NOTE — Patient Instructions (Signed)
Apply topical antiinflammatory.  Start home PT. Can use ice as needed.  Call if not  continuing to improve as expected.

## 2021-03-14 NOTE — Progress Notes (Signed)
Patient ID: [name], female    DOB: November 04, 1945, 75 y.o.   MRN: [id number]  This visit was conducted in person.  Ht _0  (1.626 m)   BMI 34.24 kg/m    CC: Chief Complaint  Patient presents with   Arm Pain    Left-From Covid Booster 02/08/21    Subjective:   HPI: [name] is a 75 y.o. female presenting on 03/14/2021 for Arm Pain (Left-From Covid Booster 02/08/21)  1 month later she reports pain in left upper arm where received COVID booster shot # 2. She reports immediately after the injection  on 02/08/2021.. no pain, no redness, no swelling.  Several days later noted stiffness and soreness with arm movement. Pain has improved over time.  Did not take any tylenol before got shot like she did with others.  No fever associated.  No numbness , no tingling.  Has been treating with OTC patch.      Relevant past medical, surgical, family and social history reviewed and updated as indicated. Interim medical history since our last visit reviewed. Allergies and medications reviewed and updated. Outpatient Medications Prior to Visit  Medication Sig Dispense Refill   acetaminophen (TYLENOL) 650 MG CR tablet Take 1,300 mg by mouth daily as needed for pain.     Acetaminophen-Codeine 300-30 MG tablet Take 1 tablet by mouth every 6 (six) hours as needed for pain.     ADVAIR HFA 230-21 MCG/ACT inhaler INHALE 2 PUFFS BY MOUTH TWICE DAILY TO PREVENT COUGH OR WHEEZING, RINSE MOUTH AFTER USE 12 g 4   albuterol (VENTOLIN HFA) 108 (90 Base) MCG/ACT inhaler INHALE 2 PUFFS BY MOUTH EVERY 6 HOURS AS NEEDED FOR WHEEZING OR SHORTNESS OF BREATH 54 g 0   atorvastatin (LIPITOR) 10 MG tablet TAKE 1 TABLET(10 MG) BY MOUTH DAILY 90 tablet 1   Blood Glucose Monitoring Suppl (ONETOUCH VERIO) w/Device KIT Use to check blood sugar up to 2 times a day 1 kit 0   Coenzyme Q10 (COQ-10) 100 MG CAPS Take 100 mg by mouth daily.     cyclobenzaprine (FLEXERIL) 10 MG tablet TAKE 1 TABLET(10 MG) BY MOUTH  AT BEDTIME AS NEEDED FOR MUSCLE SPASMS 30 tablet 1   dextromethorphan-guaiFENesin (MUCINEX DM) 30-600 MG 12hr tablet Take 1 tablet by mouth 2 (two) times daily.     DM-APAP-CPM (CORICIDIN HBP PO) Take 1 tablet by mouth daily as needed (cough/cold).     EPINEPHrine 0.3 mg/0.3 mL IJ SOAJ injection USE AS DIRECTED FOR LIFE THREATENING ALLERGIC REACTIONS 2 each 2   Ferrous Sulfate (IRON) 325 (65 Fe) MG TABS Take 325 mg by mouth daily.     fluticasone (FLOVENT HFA) 220 MCG/ACT inhaler INHALE 2 PUFFS BY MOUTH TWICE DAILY, DURING FLARE UP AS NEEDED. RINSE, GARGLE AND SPIT AFTER USE 36 g 1   furosemide (LASIX) 20 MG tablet Take 1 tablet (20 mg total) by mouth as needed for fluid (Take one daily as needed for swelling). 90 tablet 3   gabapentin (NEURONTIN) 600 MG tablet TAKE 1 TABLET BY MOUTH DAILY AT BREAKFAST, 1 TABLET AT LUNCH, AND 2 TABLETS AT BEDTIME 191 tablet 1   GARLIC PO Take 1 tablet by mouth daily.     GLYXAMBI 10-5 MG TABS TAKE 1 TABLET BY MOUTH DAILY 30 tablet 11   Homeopathic Products (THERAWORX RELIEF EX) Apply 1 spray topically daily as needed (muscle cramps).     hydrOXYzine (ATARAX/VISTARIL) 10 MG tablet Take 1 tablet (10 mg total)  by mouth daily as needed for itching. 30 tablet 2   ipratropium (ATROVENT) 0.06 % nasal spray USE 1 TO 2 SPRAYS IN EACH NOSTRIL 1 TO 2 TIMES PER DAY 15 mL 3   irbesartan (AVAPRO) 150 MG tablet TAKE 1 TABLET(150 MG) BY MOUTH DAILY 30 tablet 8   Lactase (DAIRY-RELIEF PO) Take 1 capsule by mouth daily as needed (eating dairy).      Lancets (ONETOUCH DELICA PLUS [id number]) MISC USE TO CHECK BLOOD SUGAR UP TO TWICE DAILY 100 each 5   levalbuterol (XOPENEX) 1.25 MG/3ML nebulizer solution USE 1 VIAL VIA NEBULIZER EVERY 6 HOURS AS NEEDED FOR SHORTNESS OF BREATH OR WHEEZING 900 mL 1   liver oil-zinc oxide (DESITIN) 40 % ointment Apply 1 application topically as needed for irritation.     meclizine (ANTIVERT) 12.5 MG tablet Take 1 tablet (12.5 mg total) by mouth 3 (three)  times daily as needed for dizziness. 15 tablet 0   Menthol-Methyl Salicylate (SALONPAS JET SPRAY EX) Apply 1 spray topically daily as needed (knee pain).     methimazole (TAPAZOLE) 5 MG tablet Take 0.5 tablets (2.5 mg total) by mouth every other day. 45 tablet 3   Misc Natural Products (NEURIVA PO) Take 2 tablets by mouth daily.     montelukast (SINGULAIR) 10 MG tablet TAKE 1 TABLET(10 MG) BY MOUTH DAILY 90 tablet 1   omalizumab (XOLAIR) 150 MG injection Inject 300 mg into the skin every 28 (twenty-eight) days.     ondansetron (ZOFRAN-ODT) 4 MG disintegrating tablet Take 4 mg by mouth every 8 (eight) hours as needed for nausea or vomiting.      ONETOUCH VERIO test strip USE TO CHECK BLOOD SUGAR UP TO TWICE DAILY 100 strip 5   OVER THE COUNTER MEDICATION Take 1 capsule by mouth daily. Bladder wak     OVER THE COUNTER MEDICATION Take 1 capsule by mouth daily. [employer]     OVER THE COUNTER MEDICATION Take 1 tablet by mouth daily. 7 support joint and comfort     pantoprazole (PROTONIX) 40 MG tablet TAKE 1 TABLET(40 MG) BY MOUTH DAILY 90 tablet 0   PATADAY 0.2 % SOLN Place 1 drop into both eyes daily as needed (allergies).   4   Polyethyl Glycol-Propyl Glycol (SYSTANE OP) Place 1 drop into both eyes in the morning and at bedtime.     potassium chloride SA (KLOR-CON) 20 MEQ tablet Take 1 tablet (20 mEq total) by mouth as needed (Take one daily when you need to take a Furosemide). 30 tablet 0   Tiotropium Bromide Monohydrate (SPIRIVA RESPIMAT) 1.25 MCG/ACT AERS INHALE 2 PUFFS INTO THE LUNGS DAILY 4 g 3   Tiotropium Bromide Monohydrate (SPIRIVA RESPIMAT) 1.25 MCG/ACT AERS INHALE 2 PUFFS INTO THE LUNGS DAILY 4 g 3   Tiotropium Bromide Monohydrate (SPIRIVA RESPIMAT) 1.25 MCG/ACT AERS INHALE 2 PUFFS INTO THE LUNGS DAILY 4 g 2   TRULANCE 3 MG TABS Take 3 mg by mouth every other day.     Turmeric 500 MG CAPS Take 1,000 mg by mouth daily.     XARELTO 20 MG TABS tablet TAKE 1 TABLET(20 MG) BY MOUTH DAILY  WITH SUPPER 90 tablet 1   Facility-Administered Medications Prior to Visit  Medication Dose Route Frequency Provider Last Rate Last Admin   omalizumab Arvid Right) injection 300 mg  300 mg Subcutaneous Q28 days [name], MD   300 mg at 01/31/21 1012     Per HPI unless specifically indicated in  ROS section below Review of Systems  Constitutional:  Negative for fatigue and fever.  HENT:  Negative for ear pain.   Eyes:  Negative for pain.  Respiratory:  Negative for chest tightness and shortness of breath.   Cardiovascular:  Negative for chest pain, palpitations and leg swelling.  Gastrointestinal:  Negative for abdominal pain.  Genitourinary:  Negative for dysuria.  Objective:  Ht _0  (1.626 m)   BMI 34.24 kg/m   Wt Readings from Last 3 Encounters:  01/24/21 199 lb 8 oz (90.5 kg)  12/30/20 209 lb (94.8 kg)  12/22/20 201 lb (91.2 kg)      Physical Exam Constitutional:      General: She is not in acute distress.    Appearance: Normal appearance. She is well-developed. She is not ill-appearing or toxic-appearing.  HENT:     Head: Normocephalic.     Right Ear: Hearing, tympanic membrane, ear canal and external ear normal. Tympanic membrane is not erythematous, retracted or bulging.     Left Ear: Hearing, tympanic membrane, ear canal and external ear normal. Tympanic membrane is not erythematous, retracted or bulging.     Nose: No mucosal edema or rhinorrhea.     Right Sinus: No maxillary sinus tenderness or frontal sinus tenderness.     Left Sinus: No maxillary sinus tenderness or frontal sinus tenderness.     Mouth/Throat:     Pharynx: Uvula midline.  Eyes:     General: Lids are normal. Lids are everted, no foreign bodies appreciated.     Conjunctiva/sclera: Conjunctivae normal.     Pupils: Pupils are equal, round, and reactive to light.  Neck:     Thyroid: No thyroid mass or thyromegaly.     Vascular: No carotid bruit.     Trachea: Trachea normal.  Cardiovascular:      Rate and Rhythm: Normal rate and regular rhythm.     Pulses: Normal pulses.     Heart sounds: Normal heart sounds, S1 normal and S2 normal. No murmur heard.   No friction rub. No gallop.  Pulmonary:     Effort: Pulmonary effort is normal. No tachypnea or respiratory distress.     Breath sounds: Normal breath sounds. No decreased breath sounds, wheezing, rhonchi or rales.  Abdominal:     General: Bowel sounds are normal.     Palpations: Abdomen is soft.     Tenderness: There is no abdominal tenderness.  Musculoskeletal:     Right shoulder: Normal.     Left shoulder: Tenderness present. No swelling, deformity, effusion, laceration, bony tenderness or crepitus. Decreased range of motion. Normal strength.     Cervical back: Normal range of motion and neck supple.     Comments: TTP over upper arm, inferior to shoulder joint   No redness, no swelling  Skin:    General: Skin is warm and dry.     Findings: No rash.  Neurological:     Mental Status: She is alert.  Psychiatric:        Mood and Affect: Mood is not anxious or depressed.        Speech: Speech normal.        Behavior: Behavior normal. Behavior is cooperative.        Thought Content: Thought content normal.        Judgment: Judgment normal.      Results for orders placed or performed in visit on 27/78/24  Basic Metabolic Panel  Result Value Ref Range   Sodium 142  135 - 145 mEq/L   Potassium 4.0 3.5 - 5.1 mEq/L   Chloride 106 96 - 112 mEq/L   CO2 29 19 - 32 mEq/L   Glucose, Bld 101 (H) 70 - 99 mg/dL   BUN 7 6 - 23 mg/dL   Creatinine, Ser 1.05 0.40 - 1.20 mg/dL   GFR 52.35 (L) >60.00 mL/min   Calcium 9.4 8.4 - 10.5 mg/dL   *Note: Due to a large number of results and/or encounters for the requested time period, some results have not been displayed. A complete set of results can be found in Results Review.    This visit occurred during the SARS-CoV-2 public health emergency.  Safety protocols were in place, including  screening questions prior to the visit, additional usage of staff PPE, and extensive cleaning of exam room while observing appropriate contact time as indicated for disinfecting solutions.   COVID 19 screen:  No recent travel or known exposure to COVID19 The patient denies respiratory symptoms of COVID 19 at this time. The importance of social distancing was discussed today.   Assessment and Plan Problem List Items Addressed This Visit     Left upper arm pain - Primary    Possibly SE/inflammation from COVID booster ( no Sign of infection or allergic response) vs muscle irritation vs rotator cuff tendonitis vs bursitis   Will treat with topical NSAID,and home PT. If not improving consider joint issues more strongly.          [name], MD

## 2021-03-16 ENCOUNTER — Telehealth: Payer: Self-pay | Admitting: *Deleted

## 2021-03-16 NOTE — Telephone Encounter (Signed)
Received fax from [facility]en[facility] requesting PA for Diclofenac 3% Cream.  PA completed on CoverMyMeds and sent for review.  Can take up to 72 hours for a decision.

## 2021-03-17 NOTE — Telephone Encounter (Signed)
[employer] (Diclofenac Sodium 3% Cream) PA denied.  Notified states the indication use of this medication did not meet BCBS Service's Benefits Plan's criteria. The use of this medication for the treatment of pain is considered an experimental or investigational indication for this drug.

## 2021-03-17 NOTE — Telephone Encounter (Signed)
She can get OTC voltaren given the will not cover. She requested prescription.

## 2021-03-21 ENCOUNTER — Telehealth: Payer: Self-pay | Admitting: Family Medicine

## 2021-03-21 ENCOUNTER — Other Ambulatory Visit: Payer: Self-pay | Admitting: *Deleted

## 2021-03-21 MED ORDER — DICLOFENAC SODIUM 1 % EX GEL
2.0000 g | Freq: Four times a day (QID) | CUTANEOUS | 1 refills | Status: DC
Start: 2021-03-21 — End: 2021-12-23

## 2021-03-21 NOTE — Telephone Encounter (Signed)
See phone encounter from 03/21/2021.

## 2021-03-21 NOTE — Telephone Encounter (Signed)
Spoke with Ms. Sunada.  She states Dr. Diona Browner sent in Rx for Diclofenac 3% Cream.  She states this is a stronger strength than what has been sent in the past.  Insurance will not cover the Diclofenac 3% cream.  Patient is asking if a prescription for Diclofenac 1% gel be sent to [facility] on [address].  Please sign order below if okay to send in.

## 2021-03-21 NOTE — Telephone Encounter (Signed)
[name] called in wanted to speak to [name] about changing locations for a prescription

## 2021-03-22 NOTE — Telephone Encounter (Signed)
[name] was just calling to verify which pharmacy the diclofenac gel was sent to. Patient advised Rx was sent to [facility] on [address].

## 2021-03-22 NOTE — Telephone Encounter (Signed)
Pt would like a call back to follow up on the conversation from yesterday

## 2021-03-27 ENCOUNTER — Other Ambulatory Visit: Payer: Self-pay | Admitting: Podiatry

## 2021-03-27 DIAGNOSIS — J4550 Severe persistent asthma, uncomplicated: Secondary | ICD-10-CM | POA: Diagnosis not present

## 2021-03-28 ENCOUNTER — Ambulatory Visit (INDEPENDENT_AMBULATORY_CARE_PROVIDER_SITE_OTHER): Payer: Medicare Other | Admitting: *Deleted

## 2021-03-28 ENCOUNTER — Other Ambulatory Visit: Payer: Self-pay

## 2021-03-28 ENCOUNTER — Telehealth: Payer: Self-pay | Admitting: Family Medicine

## 2021-03-28 DIAGNOSIS — J4550 Severe persistent asthma, uncomplicated: Secondary | ICD-10-CM

## 2021-03-28 NOTE — Telephone Encounter (Signed)
PA approved.  Ms. Zumpano notified of PA approval by telephone.

## 2021-03-28 NOTE — Telephone Encounter (Signed)
Pt is requesting a call from [name] to discuss her Glyxambi. She states you can reach her at [phone]

## 2021-03-28 NOTE — Telephone Encounter (Signed)
Spoke with [name].  She just wanted to let me know she got a letter from [facility] stating the PA on her Glyxambi was about to expire.  PA doesn't expire until 05/2021 but will go ahead a start renewal PA on CoverMyMeds.

## 2021-03-30 ENCOUNTER — Telehealth: Payer: Self-pay | Admitting: Podiatry

## 2021-03-30 NOTE — Telephone Encounter (Signed)
Diabetic shoes in.. lvm for pt to call to schedule an appt to pick them up.

## 2021-04-06 ENCOUNTER — Telehealth: Payer: Self-pay | Admitting: Family Medicine

## 2021-04-06 DIAGNOSIS — E119 Type 2 diabetes mellitus without complications: Secondary | ICD-10-CM

## 2021-04-06 NOTE — Telephone Encounter (Signed)
-----   Message from Ellamae Sia sent at 04/05/2021  7:55 AM EDT ----- Regarding: Lab orders for Friday, 10.7.22 Patient is scheduled for CPX labs, please order future labs, Thanks , Karna Christmas

## 2021-04-10 ENCOUNTER — Telehealth: Payer: Self-pay | Admitting: Family Medicine

## 2021-04-10 NOTE — Telephone Encounter (Signed)
Pt called and would like a return call regarding some shots she might need

## 2021-04-11 DIAGNOSIS — H04123 Dry eye syndrome of bilateral lacrimal glands: Secondary | ICD-10-CM | POA: Diagnosis not present

## 2021-04-11 DIAGNOSIS — H40013 Open angle with borderline findings, low risk, bilateral: Secondary | ICD-10-CM | POA: Diagnosis not present

## 2021-04-11 DIAGNOSIS — H43812 Vitreous degeneration, left eye: Secondary | ICD-10-CM | POA: Diagnosis not present

## 2021-04-11 DIAGNOSIS — E119 Type 2 diabetes mellitus without complications: Secondary | ICD-10-CM | POA: Diagnosis not present

## 2021-04-11 LAB — HM DIABETES EYE EXAM

## 2021-04-14 ENCOUNTER — Encounter: Payer: Self-pay | Admitting: Podiatry

## 2021-04-14 ENCOUNTER — Other Ambulatory Visit: Payer: Self-pay

## 2021-04-14 ENCOUNTER — Ambulatory Visit (INDEPENDENT_AMBULATORY_CARE_PROVIDER_SITE_OTHER): Payer: Medicare Other | Admitting: Podiatry

## 2021-04-14 DIAGNOSIS — M79676 Pain in unspecified toe(s): Secondary | ICD-10-CM

## 2021-04-14 DIAGNOSIS — Q828 Other specified congenital malformations of skin: Secondary | ICD-10-CM | POA: Diagnosis not present

## 2021-04-14 DIAGNOSIS — E1142 Type 2 diabetes mellitus with diabetic polyneuropathy: Secondary | ICD-10-CM | POA: Diagnosis not present

## 2021-04-14 DIAGNOSIS — B351 Tinea unguium: Secondary | ICD-10-CM | POA: Diagnosis not present

## 2021-04-14 NOTE — Progress Notes (Signed)
Subjective:  Patient ID: [name], female    DOB: 28-May-1946,  MRN: [id number]  75 y.o. female presents for at risk foot care with history of diabetic neuropathy and painful porokeratotic lesion(s) b/l feet and painful mycotic toenails that limit ambulation. Painful toenails interfere with ambulation. Aggravating factors include wearing enclosed shoe gear. Pain is relieved with periodic professional debridement. Painful porokeratotic lesions are aggravated when weightbearing with and without shoegear. Pain is relieved with periodic professional debridement..    Patient does not monitor blood glucose on a daily basis.  She states she is really missing her husband on today.  PCP: [name], MD and last visit was: last month.  Review of Systems: Negative except as noted in the HPI.   Allergies  Allergen Reactions   Latex Hives   Penicillins Swelling    Has patient had a PCN reaction causing immediate rash, facial/tongue/throat swelling, SOB or lightheadedness with hypotension patient had a PCN reaction causing severe rash involving mucus membranes or skin necrosis: [id number]} Has patient had a PCN reaction that required hospitalization/No Has patient had a PCN reaction occurring within the last 10 years: No If all of the above answers are "NO", then may proceed with Cephalosporin use.      Oxycodone-Acetaminophen Nausea And Vomiting   Lyrica [Pregabalin] Other (See Comments)    Blurred vision.   Nickel Rash   Phenytoin Sodium Extended Swelling   Vicodin [Hydrocodone-Acetaminophen] Nausea And Vomiting    Objective:  There were no vitals filed for this visit. Constitutional Patient is a pleasant 75 y.o. African American female in NAD. AAO x 3.  Vascular Capillary fill time to digits <3 seconds b/l lower extremities. Palpable DP pulse(s) b/l lower extremities Palpable PT pulse(s) b/l lower extremities Pedal hair sparse. Lower extremity skin temperature gradient within  normal limits. No pain with calf compression b/l. No cyanosis or clubbing noted.  Neurologic Normal speech. Pt has subjective symptoms of neuropathy. Protective sensation decreased with 10 gram monofilament b/l.  Dermatologic Pedal skin with normal turgor, texture and tone b/l lower extremities No open wounds b/l lower extremities No interdigital macerations b/l lower extremities Toenails 2-5 bilaterally elongated, discolored, dystrophic, thickened, and crumbly with subungual debris and tenderness to dorsal palpation. Anonychia noted L hallux and R hallux. Nailbed(s) epithelialized.  Porokeratotic lesion(s) R hallux, submet head 5 left foot, submet head 5 right foot, and sub 5th met base b/l feet. No erythema, no edema, no drainage, no fluctuance.  Orthopedic: Normal muscle strength 5/5 to all lower extremity muscle groups bilaterally. No pain crepitus or joint limitation noted with ROM b/l. Hammertoe(s) noted to the 2-5 bilaterally.   Hemoglobin A1C Latest Ref Rng & Units 04/20/2020  HGBA1C 4.6 - 6.5 % 6.2  Some recent data might be hidden   Assessment:   1. Pain due to onychomycosis of toenail   2. Porokeratosis   3. Diabetic polyneuropathy associated with type 2 diabetes mellitus ([place])    Plan:  -Examined patient. -Continue diabetic foot care principles. -Patient to continue soft, supportive shoe gear daily. -Toenails 2-5 bilaterally debrided in length and girth without iatrogenic bleeding with sterile nail nipper and dremel.  -Painful porokeratotic lesion(s) R hallux, submet head 5 left foot, submet head 5 right foot, and sub 5th met base b/l feet pared and enucleated with sterile scalpel blade without incident. Total number of lesions debrided=5. -Patient to report any pedal injuries to medical professional immediately. -Patient/POA to call should there be question/concern in the interim.  Return in about 3 months (around 07/14/2021).  Marzetta Board, DPM

## 2021-04-17 LAB — HM DIABETES FOOT EXAM

## 2021-04-18 ENCOUNTER — Ambulatory Visit (INDEPENDENT_AMBULATORY_CARE_PROVIDER_SITE_OTHER): Payer: Medicare Other | Admitting: Family Medicine

## 2021-04-18 ENCOUNTER — Telehealth (INDEPENDENT_AMBULATORY_CARE_PROVIDER_SITE_OTHER): Payer: Medicare Other | Admitting: Podiatry

## 2021-04-18 ENCOUNTER — Telehealth: Payer: Self-pay | Admitting: *Deleted

## 2021-04-18 ENCOUNTER — Telehealth: Payer: Self-pay | Admitting: Allergy and Immunology

## 2021-04-18 VITALS — Temp 96.0°F | Ht 64.0 in | Wt 198.2 lb

## 2021-04-18 DIAGNOSIS — R112 Nausea with vomiting, unspecified: Secondary | ICD-10-CM

## 2021-04-18 DIAGNOSIS — M62838 Other muscle spasm: Secondary | ICD-10-CM | POA: Diagnosis not present

## 2021-04-18 DIAGNOSIS — R197 Diarrhea, unspecified: Secondary | ICD-10-CM

## 2021-04-18 MED ORDER — HYDROXYZINE HCL 10 MG PO TABS: 10.0000 mg | ORAL_TABLET | Freq: Every day | ORAL | 2 refills | Status: DC | PRN

## 2021-04-18 MED ORDER — ONDANSETRON 4 MG PO TBDP: 4.0000 mg | ORAL_TABLET | Freq: Three times a day (TID) | ORAL | 0 refills | Status: DC | PRN

## 2021-04-18 MED ORDER — MECLIZINE HCL 12.5 MG PO TABS: 12.5000 mg | ORAL_TABLET | Freq: Three times a day (TID) | ORAL | 0 refills | Status: DC | PRN

## 2021-04-18 MED ORDER — CYCLOBENZAPRINE HCL 10 MG PO TABS: ORAL_TABLET | ORAL | 1 refills | Status: DC

## 2021-04-18 NOTE — Telephone Encounter (Signed)
Patient called and would like to talk with dr Neldon Mc about something primacy doctor wanted her to discuss with him.[phone].

## 2021-04-18 NOTE — Assessment & Plan Note (Signed)
Viral GE vs reaciton to shrimp.  Now resolving. No S/S of anaphylaxis.   recommended discussion with her allergist about allergy testing.  Use Zofran prn, push fluids, advance diet as tolerated.

## 2021-04-18 NOTE — Progress Notes (Signed)
TELEPHONE VISIT  Due to national recommendations of social distancing due to [place] 19, Audio telehealth visit is felt to be most appropriate for this patient at this time.   I connected with Alicia BOKHARI on 04/18/21 at 10:00 AM EDT by telephone and verified that I am speaking with the correct person using two identifiers.   I discussed the limitations, risks, security and privacy concerns of performing an evaluation and management service by telephone and the availability of in person appointments. I also discussed with the patient that there may be a patient responsible charge related to this service. The patient expressed understanding and agreed to proceed.  Patient location: Home Provider Location: [place] Participants: [name] and [name]  Chief Complaint  Patient presents with   Diarrhea    Symptoms X 3 days (since Saturday)    vomitting    History of Present Illness: 75 year old female with history of persistent atrial fibrillation, HTN, diabetes, discitis of lumboscacral region and GERD presents with new onset N/V/D.   She reports  in last 3 days  sudden onset N/V/D.Marland Kitchen started after having shrimp chinese foods ( in past had similar episode after eating at [employer]). No new medications.  No fever.  No sick contacts.  No rash, no lip swelling, no tongue swelling.   Requests refill of zofran for nauseas to use prn.   Last emesis was 2 days ago, this AM only soft stool.  Able to  keep up with water intake.  Needs refills on cyclobenzaprine and atarax.    COVID 19 screen No recent travel or known exposure to COVID19 The patient denies respiratory symptoms of COVID 19 at this time.  The importance of social distancing was discussed today.   Review of Systems  Gastrointestinal:  Positive for diarrhea, nausea and vomiting. Negative for abdominal pain, blood in stool, constipation, heartburn and melena.     Past Medical History:  Diagnosis  Date   Allergic rhinitis    Allergy    Arthritis    Asthma    Chronic headache    Diabetes mellitus    DVT (deep venous thrombosis) (HCC)    Dyspnea    Heart murmur    Hypertension    Penicillin allergy 01/19/2020   Pulmonary embolism ([place])    Pulmonary hypertension ([place])    Seizures ([place])     reports that she has never smoked. She has never used smokeless tobacco. She reports that she does not currently use alcohol. She reports that she does not use drugs.   Current Outpatient Medications:    acetaminophen (TYLENOL) 650 MG CR tablet, Take 1,300 mg by mouth daily as needed for pain., Disp: , Rfl:    Acetaminophen-Codeine 300-30 MG tablet, Take 1 tablet by mouth every 6 (six) hours as needed for pain., Disp: , Rfl:    ADVAIR HFA 230-21 MCG/ACT inhaler, INHALE 2 PUFFS BY MOUTH TWICE DAILY TO PREVENT COUGH OR WHEEZING, RINSE MOUTH AFTER USE, Disp: 12 g, Rfl: 4   albuterol (VENTOLIN HFA) 108 (90 Base) MCG/ACT inhaler, INHALE 2 PUFFS BY MOUTH EVERY 6 HOURS AS NEEDED FOR WHEEZING OR SHORTNESS OF BREATH, Disp: 54 g, Rfl: 0   atorvastatin (LIPITOR) 10 MG tablet, TAKE 1 TABLET(10 MG) BY MOUTH DAILY, Disp: 90 tablet, Rfl: 1   Blood Glucose Monitoring Suppl (ONETOUCH VERIO) w/Device KIT, Use to check blood sugar up to 2 times a day, Disp: 1 kit, Rfl: 0   Coenzyme Q10 (  COQ-10) 100 MG CAPS, Take 100 mg by mouth daily., Disp: , Rfl:    cyclobenzaprine (FLEXERIL) 10 MG tablet, TAKE 1 TABLET(10 MG) BY MOUTH AT BEDTIME AS NEEDED FOR MUSCLE SPASMS, Disp: 30 tablet, Rfl: 1   dextromethorphan-guaiFENesin (MUCINEX DM) 30-600 MG 12hr tablet, Take 1 tablet by mouth 2 (two) times daily., Disp: , Rfl:    diclofenac Sodium (VOLTAREN) 1 % GEL, Apply 2 g topically 4 (four) times daily., Disp: 400 g, Rfl: 1   EPINEPHrine 0.3 mg/0.3 mL IJ SOAJ injection, USE AS DIRECTED FOR LIFE THREATENING ALLERGIC REACTIONS, Disp: 2 each, Rfl: 2   famotidine (PEPCID) 40 MG tablet, Take 40 mg by mouth daily., Disp: , Rfl:     fluticasone (FLOVENT HFA) 220 MCG/ACT inhaler, INHALE 2 PUFFS BY MOUTH TWICE DAILY, DURING FLARE UP AS NEEDED. RINSE, GARGLE AND SPIT AFTER USE, Disp: 36 g, Rfl: 1   furosemide (LASIX) 20 MG tablet, Take 1 tablet (20 mg total) by mouth as needed for fluid (Take one daily as needed for swelling)., Disp: 90 tablet, Rfl: 3   gabapentin (NEURONTIN) 600 MG tablet, TAKE 1 TABLET BY MOUTH EVERY DAY AT BREAKFAST, 1 TABLET AT LUNCH AND 2 TABLETS AT BEDTIME, Disp: 360 tablet, Rfl: 1   GLYXAMBI 10-5 MG TABS, TAKE 1 TABLET BY MOUTH DAILY, Disp: 30 tablet, Rfl: 11   Homeopathic Products (THERAWORX RELIEF EX), Apply 1 spray topically daily as needed (muscle cramps)., Disp: , Rfl:    hydrOXYzine (ATARAX/VISTARIL) 10 MG tablet, Take 1 tablet (10 mg total) by mouth daily as needed for itching., Disp: 30 tablet, Rfl: 2   ipratropium (ATROVENT) 0.06 % nasal spray, USE 1 TO 2 SPRAYS IN EACH NOSTRIL 1 TO 2 TIMES PER DAY, Disp: 15 mL, Rfl: 3   irbesartan (AVAPRO) 150 MG tablet, TAKE 1 TABLET(150 MG) BY MOUTH DAILY, Disp: 30 tablet, Rfl: 8   Lactase (DAIRY-RELIEF PO), Take 1 capsule by mouth daily as needed (eating dairy). , Disp: , Rfl:    Lancets (ONETOUCH DELICA PLUS [id number]) MISC, USE TO CHECK BLOOD SUGAR UP TO TWICE DAILY, Disp: 100 each, Rfl: 5   levalbuterol (XOPENEX) 1.25 MG/3ML nebulizer solution, USE 1 VIAL VIA NEBULIZER EVERY 6 HOURS AS NEEDED FOR SHORTNESS OF BREATH OR WHEEZING, Disp: 900 mL, Rfl: 1   liver oil-zinc oxide (DESITIN) 40 % ointment, Apply 1 application topically as needed for irritation., Disp: , Rfl:    meclizine (ANTIVERT) 12.5 MG tablet, Take 1 tablet (12.5 mg total) by mouth 3 (three) times daily as needed for dizziness., Disp: 15 tablet, Rfl: 0   Menthol-Methyl Salicylate (SALONPAS JET SPRAY EX), Apply 1 spray topically daily as needed (knee pain)., Disp: , Rfl:    montelukast (SINGULAIR) 10 MG tablet, TAKE 1 TABLET(10 MG) BY MOUTH DAILY, Disp: 90 tablet, Rfl: 1   ondansetron (ZOFRAN-ODT) 4  MG disintegrating tablet, Take 4 mg by mouth every 8 (eight) hours as needed for nausea or vomiting. , Disp: , Rfl:    ONETOUCH VERIO test strip, USE TO CHECK BLOOD SUGAR UP TO TWICE DAILY, Disp: 100 strip, Rfl: 5   pantoprazole (PROTONIX) 40 MG tablet, TAKE 1 TABLET(40 MG) BY MOUTH DAILY, Disp: 90 tablet, Rfl: 0   PATADAY 0.2 % SOLN, Place 1 drop into both eyes daily as needed (allergies). , Disp: , Rfl: 4   Polyethyl Glycol-Propyl Glycol (SYSTANE OP), Place 1 drop into both eyes in the morning and at bedtime., Disp: , Rfl:    potassium chloride SA (KLOR-CON)  20 MEQ tablet, Take 1 tablet (20 mEq total) by mouth as needed (Take one daily when you need to take a Furosemide)., Disp: 30 tablet, Rfl: 0   Tiotropium Bromide Monohydrate (SPIRIVA RESPIMAT) 1.25 MCG/ACT AERS, INHALE 2 PUFFS INTO THE LUNGS DAILY, Disp: 4 g, Rfl: 3   TRULANCE 3 MG TABS, Take 3 mg by mouth every other day., Disp: , Rfl:    XARELTO 20 MG TABS tablet, TAKE 1 TABLET(20 MG) BY MOUTH DAILY WITH SUPPER, Disp: 90 tablet, Rfl: 1   DM-APAP-CPM (CORICIDIN HBP PO), Take 1 tablet by mouth daily as needed (cough/cold). (Patient not taking: Reported on 04/18/2021), Disp: , Rfl:    Ferrous Sulfate (IRON) 325 (65 Fe) MG TABS, Take 325 mg by mouth daily. (Patient not taking: Reported on 04/18/2021), Disp: , Rfl:    GARLIC PO, Take 1 tablet by mouth daily. (Patient not taking: Reported on 04/18/2021), Disp: , Rfl:    methimazole (TAPAZOLE) 5 MG tablet, Take 0.5 tablets (2.5 mg total) by mouth every other day. (Patient not taking: Reported on 04/18/2021), Disp: 45 tablet, Rfl: 3   Misc Natural Products (NEURIVA PO), Take 2 tablets by mouth daily. (Patient not taking: Reported on 04/18/2021), Disp: , Rfl:    OVER THE COUNTER MEDICATION, Take 1 capsule by mouth daily. Bladder wak (Patient not taking: Reported on 04/18/2021), Disp: , Rfl:    OVER THE COUNTER MEDICATION, Take 1 tablet by mouth daily. 7 support joint and comfort (Patient not taking: Reported  on 04/18/2021), Disp: , Rfl:    Turmeric 500 MG CAPS, Take 1,000 mg by mouth daily. (Patient not taking: Reported on 04/18/2021), Disp: , Rfl:   Current Facility-Administered Medications:    omalizumab Arvid Right) injection 300 mg, 300 mg, Subcutaneous, Q28 days, Kozlow, Donnamarie Poag, MD, 300 mg at 03/28/21 1037   Observations/Objective: Temperature (!) 96 F (35.6 C), temperature source Oral, height _0  (1.626 m), weight 198 lb 3.2 oz (89.9 kg).  Physical Exam  Physical Exam Constitutional:      General: The patient is not in acute distress. Pulmonary:     Effort: Pulmonary effort is normal. No respiratory distress.  Neurological:     Mental Status: The patient is alert and oriented to person, place, and time.  Psychiatric:        Mood and Affect: Mood normal.        Behavior: Behavior normal.   Assessment and Plan Problem List Items Addressed This Visit     Diarrhea - Primary    Viral GE vs reaciton to shrimp.  Now resolving. No S/S of anaphylaxis.   recommended discussion with her allergist about allergy testing.  Use Zofran prn, push fluids, advance diet as tolerated.       Other Visit Diagnoses     Muscle spasms of both lower extremities       Relevant Medications   cyclobenzaprine (FLEXERIL) 10 MG tablet      Meds ordered this encounter  Medications   cyclobenzaprine (FLEXERIL) 10 MG tablet    Sig: TAKE 1 TABLET(10 MG) BY MOUTH AT BEDTIME AS NEEDED FOR MUSCLE SPASMS    Dispense:  30 tablet    Refill:  1   hydrOXYzine (ATARAX/VISTARIL) 10 MG tablet    Sig: Take 1 tablet (10 mg total) by mouth daily as needed for itching.    Dispense:  30 tablet    Refill:  2   meclizine (ANTIVERT) 12.5 MG tablet    Sig: Take 1 tablet (12.5  mg total) by mouth 3 (three) times daily as needed for dizziness.    Dispense:  15 tablet    Refill:  0   ondansetron (ZOFRAN-ODT) 4 MG disintegrating tablet    Sig: Take 1 tablet (4 mg total) by mouth every 8 (eight) hours as needed for nausea  or vomiting.    Dispense:  20 tablet    Refill:  0      I discussed the assessment and treatment plan with the patient. The patient was provided an opportunity to ask questions and all were answered. The patient agreed with the plan and demonstrated an understanding of the instructions.   The patient was advised to call back or seek an in-person evaluation if the symptoms worsen or if the condition fails to improve as anticipated.  I provided 15 minutes of non-face-to-face time during this encounter.   [name], MD

## 2021-04-18 NOTE — Telephone Encounter (Signed)
Patient is calling with concerns about her right foot, one of the toes is becoming black, like the one on left foot. Should she scheduled a sooner appointment to evaluate? Please advise.

## 2021-04-18 NOTE — Telephone Encounter (Signed)
I returned patient's phone call. Thanks.

## 2021-04-18 NOTE — Telephone Encounter (Signed)
Patient states she forgot to mention her right 4th toe was becoming dark under her toenail like her 2nd toe. She was unsure if she needed to come in and be seen. She states she was told on previous visit, left 2nd toenail could have been taken off if addressed sooner. We discussed subungual hematomas. Patient states nails remain adhered to nailbed. No pain. No redness, drainage or swelling. She was educated on signs of infection and instructed to call with any pain, redness, drainage, swelling, lifting of nailplate. We also discussed symptoms of neuropathy which she does have in her left great toe.

## 2021-04-20 ENCOUNTER — Other Ambulatory Visit: Payer: Self-pay | Admitting: Allergy and Immunology

## 2021-04-21 ENCOUNTER — Other Ambulatory Visit: Payer: Medicare Other

## 2021-04-21 ENCOUNTER — Ambulatory Visit: Payer: Medicare Other

## 2021-04-24 DIAGNOSIS — J4550 Severe persistent asthma, uncomplicated: Secondary | ICD-10-CM | POA: Diagnosis not present

## 2021-04-25 ENCOUNTER — Ambulatory Visit: Payer: Medicare Other

## 2021-04-25 ENCOUNTER — Other Ambulatory Visit: Payer: Self-pay

## 2021-04-25 ENCOUNTER — Ambulatory Visit: Payer: Medicare Other | Admitting: Adult Health

## 2021-04-25 ENCOUNTER — Ambulatory Visit (INDEPENDENT_AMBULATORY_CARE_PROVIDER_SITE_OTHER): Payer: Medicare Other

## 2021-04-25 DIAGNOSIS — J4550 Severe persistent asthma, uncomplicated: Secondary | ICD-10-CM

## 2021-04-25 NOTE — Telephone Encounter (Signed)
Pt wants to know if she needs an allergy test to be done. Patient states she was unable to come last week and had to schedule for 11-22. Patient would like Dr. Neldon Mc to cal lher back, [phone]

## 2021-04-27 ENCOUNTER — Other Ambulatory Visit: Payer: Self-pay

## 2021-04-27 ENCOUNTER — Ambulatory Visit (INDEPENDENT_AMBULATORY_CARE_PROVIDER_SITE_OTHER): Payer: Medicare Other | Admitting: *Deleted

## 2021-04-27 ENCOUNTER — Ambulatory Visit (INDEPENDENT_AMBULATORY_CARE_PROVIDER_SITE_OTHER): Payer: Medicare Other | Admitting: Family Medicine

## 2021-04-27 ENCOUNTER — Encounter: Payer: Self-pay | Admitting: Family Medicine

## 2021-04-27 VITALS — BP 120/70 | HR 73 | Temp 98.2°F | Ht 63.5 in | Wt 201.1 lb

## 2021-04-27 DIAGNOSIS — I152 Hypertension secondary to endocrine disorders: Secondary | ICD-10-CM

## 2021-04-27 DIAGNOSIS — E1340 Other specified diabetes mellitus with diabetic neuropathy, unspecified: Secondary | ICD-10-CM

## 2021-04-27 DIAGNOSIS — I480 Paroxysmal atrial fibrillation: Secondary | ICD-10-CM

## 2021-04-27 DIAGNOSIS — E1142 Type 2 diabetes mellitus with diabetic polyneuropathy: Secondary | ICD-10-CM

## 2021-04-27 DIAGNOSIS — E119 Type 2 diabetes mellitus without complications: Secondary | ICD-10-CM

## 2021-04-27 DIAGNOSIS — Z23 Encounter for immunization: Secondary | ICD-10-CM

## 2021-04-27 DIAGNOSIS — E1140 Type 2 diabetes mellitus with diabetic neuropathy, unspecified: Secondary | ICD-10-CM

## 2021-04-27 DIAGNOSIS — E1169 Type 2 diabetes mellitus with other specified complication: Secondary | ICD-10-CM | POA: Diagnosis not present

## 2021-04-27 DIAGNOSIS — F325 Major depressive disorder, single episode, in full remission: Secondary | ICD-10-CM

## 2021-04-27 DIAGNOSIS — I272 Pulmonary hypertension, unspecified: Secondary | ICD-10-CM

## 2021-04-27 DIAGNOSIS — Z0000 Encounter for general adult medical examination without abnormal findings: Secondary | ICD-10-CM | POA: Diagnosis not present

## 2021-04-27 DIAGNOSIS — E1159 Type 2 diabetes mellitus with other circulatory complications: Secondary | ICD-10-CM

## 2021-04-27 DIAGNOSIS — E785 Hyperlipidemia, unspecified: Secondary | ICD-10-CM | POA: Diagnosis not present

## 2021-04-27 LAB — COMPREHENSIVE METABOLIC PANEL
ALT: 12 U/L (ref 0–35)
AST: 17 U/L (ref 0–37)
Albumin: 4 g/dL (ref 3.5–5.2)
Alkaline Phosphatase: 63 U/L (ref 39–117)
BUN: 10 mg/dL (ref 6–23)
CO2: 31 mEq/L (ref 19–32)
Calcium: 9.3 mg/dL (ref 8.4–10.5)
Chloride: 108 mEq/L (ref 96–112)
Creatinine, Ser: 0.93 mg/dL (ref 0.40–1.20)
GFR: 60.45 mL/min (ref >60.00)
Glucose, Bld: 99 mg/dL (ref 70–99)
Potassium: 4.3 mEq/L (ref 3.5–5.1)
Sodium: 144 mEq/L (ref 135–145)
Total Bilirubin: 1.1 mg/dL (ref 0.2–1.2)
Total Protein: 6.2 g/dL (ref 6.0–8.3)

## 2021-04-27 LAB — LIPID PANEL
Cholesterol: 120 mg/dL (ref 0–200)
HDL: 61 mg/dL (ref >39.00)
LDL Cholesterol: 49 mg/dL (ref 0–99)
NonHDL: 58.94
Total CHOL/HDL Ratio: 2
Triglycerides: 49 mg/dL (ref 0.0–149.0)
VLDL: 9.8 mg/dL (ref 0.0–40.0)

## 2021-04-27 LAB — HEMOGLOBIN A1C: Hgb A1c MFr Bld: 5.7 % (ref 4.6–6.5)

## 2021-04-27 NOTE — Progress Notes (Addendum)
Patient presents to the office today with issues concerning the diabetic shoes.   Patient realized once I brought her back into the treatment room that she had forgotten to bring her shoes. She did say the problem was that they were slipping on her heel when she walked.   I dispensed some heel cushion/gripper to help remedy this problem. I demonstrated how to put them in.   She will take these home and give them a try and if it does not work she will bring the shoes back later today and pick out a new shoe from the catalog.    Addendum: Patient returned to the office today with her shoes stating that the heel grippers did not help with the slipping. She was given the catalog to pick out a new shoe.  We will send back: [employer] Women's 11 Medium  Reorder: [employer] Women's 11 Medium  Insoles were kept with the patient to check fit with reordered shoes.  Patient will be contacted for a fitting appointment once the reordered shoes arrive in office.

## 2021-04-27 NOTE — Patient Instructions (Addendum)
Please stop at the lab to have labs drawn.  When you schedule mammogram next 01/2022.. make sure to ask for bone density as well.

## 2021-04-27 NOTE — Progress Notes (Signed)
Patient ID: [name], female    DOB: Mar 16, 1946, 75 y.o.   MRN: [id number]  This visit was conducted in person.  BP 120/70   Pulse 73   Temp 98.2 F (36.8 C) (Temporal)   Ht 5' 3.5" (1.613 m)   Wt 201 lb 1 oz (91.2 kg)   SpO2 95%   BMI 35.06 kg/m    CC: Chief Complaint  Patient presents with   Medicare Wellness    Subjective:   HPI: [name] is a 75 y.o. female presenting on 04/27/2021 for Medicare Wellness  I have personally reviewed the Medicare Annual Wellness questionnaire and have noted 1. The patient's medical and social history 2. Their use of alcohol, tobacco or illicit drugs 3. Their current medications and supplements 4. The patient's functional ability including ADL's, fall risks, home safety risks and hearing or visual             impairment. 5. Diet and physical activities 6. Evidence for depression or mood disorders 7.         Updated provider list Cognitive evaluation was performed and recorded on pt medicare questionnaire form. The patients weight, height, BMI and visual acuity have been recorded in the chart   I have made referrals, counseling and provided education to the patient based review of the above and I have provided the pt with a written personalized care plan for preventive services.   Documentation of this information was scanned into the electronic record under the media tab.   Advance directives and end of life planning reviewed in detail with patient and documented in EMR. Patient given handout on advance care directives if needed. HCPOA and living will updated if needed.  Hearing Screening   _0  _1  _2  _3   Right ear _4 Left ear _5 Vision Screening - Comments:: Last eye exam, 04/2021.  Fall Risk  04/27/2021 06/20/2020 04/19/2020 02/23/2020 01/04/2020  Falls in the past year? 0 0 0 0 0  Number falls in past yr: - - - - -  Injury with Fall? - - - - -  Risk Factor Category  - - - - -   Comment - - - - -  Risk for fall due to : - - - No Fall Risks -  Follow up - - - Falls evaluation completed -   [place] [facility] Visit from 04/27/2021 in [place] at [facility]  PHQ-2 Total Score 0      Hypertension:  Good control. BP Readings from Last 3 Encounters:  04/27/21 120/70  03/14/21 100/60  01/24/21 102/68  Using medication without problems or lightheadedness:  none Chest pain with exertion: none Edema:none Short of breath:none Average home BPs: Other issues:   Due for re-eval of DM and cholesterol Due for foot exam. Has had eye eval 1 month ago Dr Viona Gilmore.     Relevant past medical, surgical, family and social history reviewed and updated as indicated. Interim medical history since our last visit reviewed. Allergies and medications reviewed and updated. Outpatient Medications Prior to Visit  Medication Sig Dispense Refill   acetaminophen (TYLENOL) 650 MG CR tablet Take 1,300 mg by mouth daily as needed for pain.     Acetaminophen-Codeine 300-30 MG tablet Take 1 tablet by mouth every 6 (six) hours as needed for pain.     ADVAIR HFA 458-59 MCG/ACT inhaler INHALE 2 PUFFS BY MOUTH TWICE DAILY TO PREVENT COUGH OR WHEEZING,  RINSE MOUTH AFTER USE 12 g 4   albuterol (VENTOLIN HFA) 108 (90 Base) MCG/ACT inhaler INHALE 2 PUFFS BY MOUTH EVERY 6 HOURS AS NEEDED FOR WHEEZING OR SHORTNESS OF BREATH 54 g 0   atorvastatin (LIPITOR) 10 MG tablet TAKE 1 TABLET(10 MG) BY MOUTH DAILY 90 tablet 1   Blood Glucose Monitoring Suppl (ONETOUCH VERIO) w/Device KIT Use to check blood sugar up to 2 times a day 1 kit 0   cyclobenzaprine (FLEXERIL) 10 MG tablet TAKE 1 TABLET(10 MG) BY MOUTH AT BEDTIME AS NEEDED FOR MUSCLE SPASMS 30 tablet 1   dextromethorphan-guaiFENesin (MUCINEX DM) 30-600 MG 12hr tablet Take 1 tablet by mouth 2 (two) times daily.     diclofenac Sodium (VOLTAREN) 1 % GEL Apply 2 g topically 4 (four) times daily. 400 g 1   DM-APAP-CPM (CORICIDIN HBP PO) Take 1 tablet  by mouth daily as needed (cough/cold).     EPINEPHrine 0.3 mg/0.3 mL IJ SOAJ injection USE AS DIRECTED FOR LIFE THREATENING ALLERGIC REACTIONS 2 each 2   famotidine (PEPCID) 40 MG tablet TAKE 1 TABLET(40 MG) BY MOUTH DAILY 30 tablet 2   fluticasone (FLOVENT HFA) 220 MCG/ACT inhaler INHALE 2 PUFFS BY MOUTH TWICE DAILY, DURING FLARE UP AS NEEDED. RINSE, GARGLE AND SPIT AFTER USE 36 g 1   furosemide (LASIX) 20 MG tablet Take 1 tablet (20 mg total) by mouth as needed for fluid (Take one daily as needed for swelling). 90 tablet 3   gabapentin (NEURONTIN) 600 MG tablet TAKE 1 TABLET BY MOUTH EVERY DAY AT BREAKFAST, 1 TABLET AT LUNCH AND 2 TABLETS AT BEDTIME 248 tablet 1   GARLIC PO Take 1 tablet by mouth daily.     GLYXAMBI 10-5 MG TABS TAKE 1 TABLET BY MOUTH DAILY 30 tablet 11   Homeopathic Products (THERAWORX RELIEF EX) Apply 1 spray topically daily as needed (muscle cramps).     hydrOXYzine (ATARAX/VISTARIL) 10 MG tablet Take 1 tablet (10 mg total) by mouth daily as needed for itching. 30 tablet 2   ipratropium (ATROVENT) 0.06 % nasal spray USE 1 TO 2 SPRAYS IN EACH NOSTRIL 1 TO 2 TIMES PER DAY 15 mL 3   irbesartan (AVAPRO) 150 MG tablet TAKE 1 TABLET(150 MG) BY MOUTH DAILY 30 tablet 8   Lactase (DAIRY-RELIEF PO) Take 1 capsule by mouth daily as needed (eating dairy).      Lancets (ONETOUCH DELICA PLUS [id number]) MISC USE TO CHECK BLOOD SUGAR UP TO TWICE DAILY 100 each 5   levalbuterol (XOPENEX) 1.25 MG/3ML nebulizer solution USE 1 VIAL VIA NEBULIZER EVERY 6 HOURS AS NEEDED FOR SHORTNESS OF BREATH OR WHEEZING 900 mL 1   liver oil-zinc oxide (DESITIN) 40 % ointment Apply 1 application topically as needed for irritation.     meclizine (ANTIVERT) 12.5 MG tablet Take 1 tablet (12.5 mg total) by mouth 3 (three) times daily as needed for dizziness. 15 tablet 0   Menthol-Methyl Salicylate (SALONPAS JET SPRAY EX) Apply 1 spray topically daily as needed (knee pain).     methimazole (TAPAZOLE) 5 MG tablet Take  0.5 tablets (2.5 mg total) by mouth every other day. 45 tablet 3   Misc Natural Products (NEURIVA PO) Take 2 tablets by mouth daily.     montelukast (SINGULAIR) 10 MG tablet TAKE 1 TABLET(10 MG) BY MOUTH DAILY 90 tablet 1   ondansetron (ZOFRAN-ODT) 4 MG disintegrating tablet Take 1 tablet (4 mg total) by mouth every 8 (eight) hours as needed for nausea or vomiting. [place]  tablet 0   ONETOUCH VERIO test strip USE TO CHECK BLOOD SUGAR UP TO TWICE DAILY 100 strip 5   pantoprazole (PROTONIX) 40 MG tablet TAKE 1 TABLET(40 MG) BY MOUTH DAILY 90 tablet 0   PATADAY 0.2 % SOLN Place 1 drop into both eyes daily as needed (allergies).   4   Polyethyl Glycol-Propyl Glycol (SYSTANE OP) Place 1 drop into both eyes in the morning and at bedtime.     potassium chloride SA (KLOR-CON) 20 MEQ tablet Take 1 tablet (20 mEq total) by mouth as needed (Take one daily when you need to take a Furosemide). 30 tablet 0   Tiotropium Bromide Monohydrate (SPIRIVA RESPIMAT) 1.25 MCG/ACT AERS INHALE 2 PUFFS INTO THE LUNGS DAILY 4 g 3   TRULANCE 3 MG TABS Take 3 mg by mouth every other day.     XARELTO 20 MG TABS tablet TAKE 1 TABLET(20 MG) BY MOUTH DAILY WITH SUPPER 90 tablet 1   Turmeric 500 MG CAPS Take 1,000 mg by mouth daily. (Patient not taking: No sig reported)     Coenzyme Q10 (COQ-10) 100 MG CAPS Take 100 mg by mouth daily.     Ferrous Sulfate (IRON) 325 (65 Fe) MG TABS Take 325 mg by mouth daily. (Patient not taking: Reported on 04/18/2021)     OVER THE COUNTER MEDICATION Take 1 capsule by mouth daily. Bladder wak (Patient not taking: Reported on 04/18/2021)     OVER THE COUNTER MEDICATION Take 1 tablet by mouth daily. 7 support joint and comfort (Patient not taking: Reported on 04/18/2021)     Facility-Administered Medications Prior to Visit  Medication Dose Route Frequency Provider Last Rate Last Admin   omalizumab Arvid Right) injection 300 mg  300 mg Subcutaneous Q28 days [name], MD   300 mg at 04/25/21 2505     Per  HPI unless specifically indicated in ROS section below Review of Systems  Constitutional:  Negative for fatigue and fever.  HENT:  Negative for congestion.   Eyes:  Negative for pain.  Respiratory:  Negative for cough and shortness of breath.   Cardiovascular:  Negative for chest pain, palpitations and leg swelling.  Gastrointestinal:  Negative for abdominal pain.  Genitourinary:  Negative for dysuria and vaginal bleeding.  Musculoskeletal:  Negative for back pain.  Neurological:  Negative for syncope, light-headedness and headaches.  Psychiatric/Behavioral:  Negative for dysphoric mood.   Objective:  BP 120/70   Pulse 73   Temp 98.2 F (36.8 C) (Temporal)   Ht 5' 3.5" (1.613 m)   Wt 201 lb 1 oz (91.2 kg)   SpO2 95%   BMI 35.06 kg/m   Wt Readings from Last 3 Encounters:  04/27/21 201 lb 1 oz (91.2 kg)  04/18/21 198 lb 3.2 oz (89.9 kg)  03/14/21 204 lb 8 oz (92.8 kg)      Physical Exam Vitals and nursing note reviewed.  Constitutional:      General: She is not in acute distress.    Appearance: Normal appearance. She is well-developed. She is obese. She is not ill-appearing or toxic-appearing.  HENT:     Head: Normocephalic.     Right Ear: Hearing, tympanic membrane, ear canal and external ear normal.     Left Ear: Hearing, tympanic membrane, ear canal and external ear normal.     Nose: Nose normal.  Eyes:     General: Lids are normal. Lids are everted, no foreign bodies appreciated.     Conjunctiva/sclera: Conjunctivae normal.  Pupils: Pupils are equal, round, and reactive to light.  Neck:     Thyroid: No thyroid mass or thyromegaly.     Vascular: No carotid bruit.     Trachea: Trachea normal.  Cardiovascular:     Rate and Rhythm: Normal rate and regular rhythm.     Heart sounds: Normal heart sounds, S1 normal and S2 normal. No murmur heard.   No gallop.  Pulmonary:     Effort: Pulmonary effort is normal. No respiratory distress.     Breath sounds: Normal breath  sounds. No wheezing, rhonchi or rales.  Abdominal:     General: Bowel sounds are normal. There is no distension or abdominal bruit.     Palpations: Abdomen is soft. There is no fluid wave or mass.     Tenderness: There is no abdominal tenderness. There is no guarding or rebound.     Hernia: No hernia is present.  Musculoskeletal:     Cervical back: Normal range of motion and neck supple.  Lymphadenopathy:     Cervical: No cervical adenopathy.  Skin:    General: Skin is warm and dry.     Findings: No rash.  Neurological:     Mental Status: She is alert.     Cranial Nerves: No cranial nerve deficit.     Sensory: No sensory deficit.  Psychiatric:        Mood and Affect: Mood is not anxious or depressed.        Speech: Speech normal.        Behavior: Behavior normal. Behavior is cooperative.        Judgment: Judgment normal.     Diabetic foot exam: Normal inspection No skin breakdown No calluses  Normal DP pulses Normal sensation to light touch and monofilament Nails normal  Results for orders placed or performed in visit on 62/69/48  Basic Metabolic Panel  Result Value Ref Range   Sodium 142 135 - 145 mEq/L   Potassium 4.0 3.5 - 5.1 mEq/L   Chloride 106 96 - 112 mEq/L   CO2 29 19 - 32 mEq/L   Glucose, Bld 101 (H) 70 - 99 mg/dL   BUN 7 6 - 23 mg/dL   Creatinine, Ser 1.05 0.40 - 1.20 mg/dL   GFR 52.35 (L) >60.00 mL/min   Calcium 9.4 8.4 - 10.5 mg/dL   *Note: Due to a large number of results and/or encounters for the requested time period, some results have not been displayed. A complete set of results can be found in Results Review.    This visit occurred during the SARS-CoV-2 public health emergency.  Safety protocols were in place, including screening questions prior to the visit, additional usage of staff PPE, and extensive cleaning of exam room while observing appropriate contact time as indicated for disinfecting solutions.   COVID 19 screen:  No recent travel or  known exposure to COVID19 The patient denies respiratory symptoms of COVID 19 at this time. The importance of social distancing was discussed today.   Assessment and Plan The patient's preventative maintenance and recommended screening tests for an annual wellness exam were reviewed in full today. Brought up to date unless services declined.  Counselled on the importance of diet, exercise, and its role in overall health and mortality. The patient's FH and SH was reviewed, including their home life, tobacco status, and drug and alcohol status.   Mammogram 01/27/2021 DEXA 2016.. repeat due  PAP not indicated Hep C neg  Vaccines: uptodate with tdap,  PNA, flu given today, shingle and COVID x4, plans bivalent booster.  Colonoscopy 2019, repeat in 5 years  Problem List Items Addressed This Visit     Hyperlipidemia associated with type 2 diabetes mellitus ([place]) (Chronic)    Due for re-eval.      Paroxysmal atrial fibrillation with rapid ventricular response (HCC) (Chronic)     Stable, rate controlled, followed by cardiology.      Controlled type 2 diabetes mellitus without complication (HCC)   Depression, major, in remission ([place])    Stable control.       Diabetes mellitus with neuropathy (HCC)    Due for re-eval.      Hypertension associated with diabetes ([place])    Stable, chronic.  Continue current medication. Euvolemic.  avapro 150 mg daily  lasix 20 mg daily      Neuropathy due to secondary diabetes (HCC)    Due to DM.  Stable control on current regimen.      Pulmonary hypertension (HCC)    Stable.      Other Visit Diagnoses     Medicare annual wellness visit, subsequent    -  Primary   Need for influenza vaccination       Relevant Orders   Flu Vaccine QUAD 61moIM (Fluarix, Fluzone & Alfiuria Quad PF) (Completed)      Orders Placed This Encounter  Procedures   Flu Vaccine QUAD 652moM (Fluarix, Fluzone & Alfiuria Quad PF)   [facility] DIABETES FOOT EXAM    This  external order was created through the Results Console.      AmEliezer LoftsMD

## 2021-05-01 ENCOUNTER — Encounter: Payer: Self-pay | Admitting: *Deleted

## 2021-05-01 ENCOUNTER — Encounter: Payer: Self-pay | Admitting: Family Medicine

## 2021-05-02 ENCOUNTER — Ambulatory Visit: Payer: Medicare Other | Admitting: Internal Medicine

## 2021-05-02 ENCOUNTER — Encounter: Payer: Self-pay | Admitting: Internal Medicine

## 2021-05-02 ENCOUNTER — Ambulatory Visit (INDEPENDENT_AMBULATORY_CARE_PROVIDER_SITE_OTHER): Payer: Medicare Other | Admitting: Internal Medicine

## 2021-05-02 VITALS — BP 138/76 | HR 88 | Ht 63.5 in | Wt 201.2 lb

## 2021-05-02 DIAGNOSIS — E0500 Thyrotoxicosis with diffuse goiter without thyrotoxic crisis or storm: Secondary | ICD-10-CM

## 2021-05-02 DIAGNOSIS — E041 Nontoxic single thyroid nodule: Secondary | ICD-10-CM

## 2021-05-02 LAB — TSH: TSH: 0.25 u[IU]/mL — ABNORMAL LOW (ref 0.35–5.50)

## 2021-05-02 LAB — T4, FREE: Free T4: 1.09 ng/dL (ref 0.60–1.60)

## 2021-05-02 LAB — T3, FREE: T3, Free: 2.6 pg/mL (ref 2.3–4.2)

## 2021-05-02 NOTE — Patient Instructions (Signed)
For now, continue off Methimazole.  Please stop at the lab.  Please come back for a follow-up appointment in 6 months.

## 2021-05-02 NOTE — Progress Notes (Signed)
Patient ID: [name], female   DOB: 11-25-1945, 75 y.o.   MRN: [id number]   This visit occurred during the SARS-CoV-2 public health emergency.  Safety protocols were in place, including screening questions prior to the visit, additional usage of staff PPE, and extensive cleaning of exam room while observing appropriate contact time as indicated for disinfecting solutions.   HPI  [name] is a 75 y.o.-year-old female, returning for f/u for for thyrotoxicosis and thyroid nodule.  She previously saw another endocrinologist (Dr. Wilson Singer), but only for her diabetes. She is the sister of [name], also my pt. Last OV with me 4 months ago.  Interim history: She denies tremors, palpitations, weight loss, heat intolerance, but had some anxiety.  Reviewed and addended history: Patient was found to be thyrotoxic in 08/2015 but she reports that she was only informed about this in 2018.  Thyroid scan indicated possible Graves' disease and also a cold left thyroid nodule.    At that time, she was recommended surgery but she did not want to proceed with this and started to see me.    She was started on methimazole initially 10 mg daily.  Doses were changed afterwards:  Most recently: In 09/2018 we decreased the dose of methimazole to 2.5 mg daily.   In 04/2020, we stopped methimazole In 05/2020, we restarted methimazole 5 mg daily (she misunderstood instructions to only start 2.5 mg daily) In 10/2020, we decreased the methimazole to 2.5 mg daily In 12/2020, we decreased methimazole to 2.5 mg every other day In 01/2021, we stopped methimazole  She also continues on diltiazem CD.  Her left thyroid nodule was biopsied with benign results in 2019.    Reviewed her TFTs: Lab Results  Component Value Date   TSH 1.23 01/18/2021   TSH 4.07 12/15/2020   TSH 3.16 11/29/2020   TSH 4.38 10/18/2020   TSH 2.27 07/21/2020   TSH 0.08 (L) 06/14/2020   TSH 3.82 04/26/2020   TSH 1.25 11/03/2019    TSH 1.39 04/06/2019   TSH 1.84 11/27/2018   FREET4 1.03 01/18/2021   FREET4 1.11 12/15/2020   FREET4 1.05 11/29/2020   FREET4 0.90 10/18/2020   FREET4 1.06 07/21/2020   FREET4 1.31 06/14/2020   FREET4 0.97 04/26/2020   FREET4 1.02 11/03/2019   FREET4 0.98 04/06/2019   FREET4 0.93 11/27/2018   T3FREE 3.0 01/18/2021   T3FREE 2.6 12/15/2020   T3FREE 2.7 11/29/2020   T3FREE 2.3 10/18/2020   T3FREE 2.5 07/21/2020   T3FREE 3.8 06/14/2020   T3FREE 3.1 04/26/2020   T3FREE 2.5 11/03/2019   T3FREE 2.7 04/06/2019   T3FREE 3.0 11/27/2018   Thyroid uptake and scan (10/31/2017): Increased uptake of 40%, and cold left inferior pole nodule.  Thyroid U/S (11/12/2017): 3.2 cm left inferior TR 3 nodule correlates with the cold nodule by nuclear medicine scan. This nodule meets criteria for biopsy as above.  FNA of her left thyroid nodule (12/04/2017): Benign  Thyroid U/S (10/23/2019): Slightly enlarged gland but not due to enlargement of the thyroid nodule. It is possible that this is due to more inflammation. Parenchymal Echotexture: Mildly heterogenous Isthmus: Normal in size measuring 0.6 cm in diameter, unchanged Right lobe: Normal in size measuring 5.3 x 1.4 x 1.6 cm, previously, 4.1 x 1.2 x 1.5 cm Left lobe: Enlarged measuring 6.9 x 3.9 x 2.4 cm, previously, 6.1 x 2.1 x 2.3 cm. ___________________________________________________________   The previously biopsied approximately 3.0 x 2.7 x 1.9 cm isoechoic ill-defined  nodule within the mid/inferior aspect of the left lobe of the thyroid is grossly unchanged in size compared to the 10/2017 examination, previously, 3.2 x 2.5 x 2.0 cm. Correlation with previous biopsy results is advised.   IMPRESSION: 1. Similar appearing mildly heterogeneous and borderline enlarged thyroid gland without worrisome new or enlarging thyroid nodule. 2. Previously biopsied nodule within the mid/inferior aspect the left lobe of the thyroid is grossly  unchanged compared to the 10/2017 examination. Correlation with previous biopsy results is advised. Assuming a benign pathologic diagnosis, repeat sampling and/or continued dedicated follow-up is not recommend  Her Graves' antibodies were elevated: Lab Results  Component Value Date   TSI 376 (H) 04/26/2020   TSI 331 (H) 11/03/2019   TSI 395 (H) 04/06/2019   TSI 361 (H) 02/06/2018   Pt denies: - feeling nodules in neck - hoarseness - dysphagia - SOB with lying down Rare, occasional, choking with drier foods.  No FH of thyroid disease or cancer. No h/o radiation tx to head or neck.  No current biotin use. No recent steroids use.    She also has a history of type 2 diabetes - well controlled - managed by PCP. Lab Results  Component Value Date   HGBA1C 5.7 04/27/2021   She has asthma, on 2 inhalers. Sees Dr. Neldon Mc. She had cardioversion on 11/23/2020 - A. Fib with SOB.   ROS:  + See HPI  I reviewed pt's medications, allergies, PMH, social hx, family hx, and changes were documented in the history of present illness. Otherwise, unchanged from my initial visit note.  Past Medical History:  Diagnosis Date   Allergic rhinitis    Allergy    Arthritis    Asthma    Chronic headache    Diabetes mellitus    DVT (deep venous thrombosis) (HCC)    Dyspnea    Heart murmur    Hypertension    Penicillin allergy 01/19/2020   Pulmonary embolism (HCC)    Pulmonary hypertension (HCC)    Seizures ([place])    Past Surgical History:  Procedure Laterality Date   ABDOMINAL HYSTERECTOMY     partial, has ovaries   CARDIAC CATHETERIZATION  12/28/2010   Mod. pulmonary hypertension, normal coronary arteries   CARDIOVERSION N/A 11/23/2020   Procedure: CARDIOVERSION;  Surgeon: Sanda Klein, MD;  Location: [facility];  Service: Cardiovascular;  Laterality: N/A;   DOPPLER ECHOCARDIOGRAPHY  10/08/2011   EF=>55%,mild asymmetric LVH, mod. TR, mod. PH, mild to mod LA dilatation   IR LUMBAR DISC  ASPIRATION W/IMG GUIDE  01/12/2020   KNEE ARTHROSCOPY Left    KNEE SURGERY     Nuclear Stress Test  05/20/2006   No ischemia   PARTIAL HYSTERECTOMY     PLANTAR FASCIA SURGERY     TONSILLECTOMY     Social History   Socioeconomic History   Marital status: Widowed    Spouse name: Not on file   Number of children: Y   Years of education: Not on file   Highest education level: Not on file  Occupational History   Occupation: retired Geologist, engineering.   Tobacco Use   Smoking status: Never   Smokeless tobacco: Never  Vaping Use   Vaping Use: Never used  Substance and Sexual Activity   Alcohol use: Not Currently    Alcohol/week: 0.0 standard drinks    Comment: occ glass on wine   Drug use: No   Sexual activity: Never  Other Topics Concern   Not on file  Social  History Narrative   Widow    limited exercise.   Social Determinants of Health   Financial Resource Strain: Low Risk    Difficulty of Paying Living Expenses: Not very hard  Food Insecurity: Not on file  Transportation Needs: Not on file  Physical Activity: Not on file  Stress: Not on file  Social Connections: Not on file  Intimate Partner Violence: Not on file   Current Outpatient Medications on File Prior to Visit  Medication Sig Dispense Refill   acetaminophen (TYLENOL) 650 MG CR tablet Take 1,300 mg by mouth daily as needed for pain.     Acetaminophen-Codeine 300-30 MG tablet Take 1 tablet by mouth every 6 (six) hours as needed for pain.     ADVAIR HFA 230-21 MCG/ACT inhaler INHALE 2 PUFFS BY MOUTH TWICE DAILY TO PREVENT COUGH OR WHEEZING, RINSE MOUTH AFTER USE 12 g 4   albuterol (VENTOLIN HFA) 108 (90 Base) MCG/ACT inhaler INHALE 2 PUFFS BY MOUTH EVERY 6 HOURS AS NEEDED FOR WHEEZING OR SHORTNESS OF BREATH 54 g 0   atorvastatin (LIPITOR) 10 MG tablet TAKE 1 TABLET(10 MG) BY MOUTH DAILY 90 tablet 1   Blood Glucose Monitoring Suppl (ONETOUCH VERIO) w/Device KIT Use to check blood sugar up to 2 times a day 1 kit 0    cyclobenzaprine (FLEXERIL) 10 MG tablet TAKE 1 TABLET(10 MG) BY MOUTH AT BEDTIME AS NEEDED FOR MUSCLE SPASMS 30 tablet 1   dextromethorphan-guaiFENesin (MUCINEX DM) 30-600 MG 12hr tablet Take 1 tablet by mouth 2 (two) times daily.     diclofenac Sodium (VOLTAREN) 1 % GEL Apply 2 g topically 4 (four) times daily. 400 g 1   DM-APAP-CPM (CORICIDIN HBP PO) Take 1 tablet by mouth daily as needed (cough/cold).     EPINEPHrine 0.3 mg/0.3 mL IJ SOAJ injection USE AS DIRECTED FOR LIFE THREATENING ALLERGIC REACTIONS 2 each 2   famotidine (PEPCID) 40 MG tablet TAKE 1 TABLET(40 MG) BY MOUTH DAILY 30 tablet 2   fluticasone (FLOVENT HFA) 220 MCG/ACT inhaler INHALE 2 PUFFS BY MOUTH TWICE DAILY, DURING FLARE UP AS NEEDED. RINSE, GARGLE AND SPIT AFTER USE 36 g 1   furosemide (LASIX) 20 MG tablet Take 1 tablet (20 mg total) by mouth as needed for fluid (Take one daily as needed for swelling). 90 tablet 3   gabapentin (NEURONTIN) 600 MG tablet TAKE 1 TABLET BY MOUTH EVERY DAY AT BREAKFAST, 1 TABLET AT LUNCH AND 2 TABLETS AT BEDTIME 101 tablet 1   GARLIC PO Take 1 tablet by mouth daily.     GLYXAMBI 10-5 MG TABS TAKE 1 TABLET BY MOUTH DAILY 30 tablet 11   Homeopathic Products (THERAWORX RELIEF EX) Apply 1 spray topically daily as needed (muscle cramps).     hydrOXYzine (ATARAX/VISTARIL) 10 MG tablet Take 1 tablet (10 mg total) by mouth daily as needed for itching. 30 tablet 2   ipratropium (ATROVENT) 0.06 % nasal spray USE 1 TO 2 SPRAYS IN EACH NOSTRIL 1 TO 2 TIMES PER DAY 15 mL 3   irbesartan (AVAPRO) 150 MG tablet TAKE 1 TABLET(150 MG) BY MOUTH DAILY 30 tablet 8   Lactase (DAIRY-RELIEF PO) Take 1 capsule by mouth daily as needed (eating dairy).      Lancets (ONETOUCH DELICA PLUS [id number]) MISC USE TO CHECK BLOOD SUGAR UP TO TWICE DAILY 100 each 5   levalbuterol (XOPENEX) 1.25 MG/3ML nebulizer solution USE 1 VIAL VIA NEBULIZER EVERY 6 HOURS AS NEEDED FOR SHORTNESS OF BREATH OR WHEEZING 900 mL 1  liver oil-zinc  oxide (DESITIN) 40 % ointment Apply 1 application topically as needed for irritation.     meclizine (ANTIVERT) 12.5 MG tablet Take 1 tablet (12.5 mg total) by mouth 3 (three) times daily as needed for dizziness. 15 tablet 0   Menthol-Methyl Salicylate (SALONPAS JET SPRAY EX) Apply 1 spray topically daily as needed (knee pain).     methimazole (TAPAZOLE) 5 MG tablet Take 0.5 tablets (2.5 mg total) by mouth every other day. 45 tablet 3   Misc Natural Products (NEURIVA PO) Take 2 tablets by mouth daily.     montelukast (SINGULAIR) 10 MG tablet TAKE 1 TABLET(10 MG) BY MOUTH DAILY 90 tablet 1   ondansetron (ZOFRAN-ODT) 4 MG disintegrating tablet Take 1 tablet (4 mg total) by mouth every 8 (eight) hours as needed for nausea or vomiting. 20 tablet 0   ONETOUCH VERIO test strip USE TO CHECK BLOOD SUGAR UP TO TWICE DAILY 100 strip 5   pantoprazole (PROTONIX) 40 MG tablet TAKE 1 TABLET(40 MG) BY MOUTH DAILY 90 tablet 0   PATADAY 0.2 % SOLN Place 1 drop into both eyes daily as needed (allergies).   4   Polyethyl Glycol-Propyl Glycol (SYSTANE OP) Place 1 drop into both eyes in the morning and at bedtime.     potassium chloride SA (KLOR-CON) 20 MEQ tablet Take 1 tablet (20 mEq total) by mouth as needed (Take one daily when you need to take a Furosemide). 30 tablet 0   Tiotropium Bromide Monohydrate (SPIRIVA RESPIMAT) 1.25 MCG/ACT AERS INHALE 2 PUFFS INTO THE LUNGS DAILY 4 g 3   TRULANCE 3 MG TABS Take 3 mg by mouth every other day.     Turmeric 500 MG CAPS Take 1,000 mg by mouth daily. (Patient not taking: No sig reported)     XARELTO 20 MG TABS tablet TAKE 1 TABLET(20 MG) BY MOUTH DAILY WITH SUPPER 90 tablet 1   Current Facility-Administered Medications on File Prior to Visit  Medication Dose Route Frequency Provider Last Rate Last Admin   omalizumab Arvid Right) injection 300 mg  300 mg Subcutaneous Q28 days [name], MD   300 mg at 04/25/21 7124   Allergies  Allergen Reactions   Latex Hives    Penicillins Swelling    Has patient had a PCN reaction causing immediate rash, facial/tongue/throat swelling, SOB or lightheadedness with hypotension patient had a PCN reaction causing severe rash involving mucus membranes or skin necrosis: [id number]} Has patient had a PCN reaction that required hospitalization/No Has patient had a PCN reaction occurring within the last 10 years: No If all of the above answers are "NO", then may proceed with Cephalosporin use.      Oxycodone-Acetaminophen Nausea And Vomiting   Lyrica [Pregabalin] Other (See Comments)    Blurred vision.   Nickel Rash   Phenytoin Sodium Extended Swelling   Vicodin [Hydrocodone-Acetaminophen] Nausea And Vomiting   Family History  Problem Relation Age of Onset   Hypertension Mother    Clotting disorder Mother    Breast cancer Mother    Arthritis Mother    Stroke Mother    Diabetes Mother    Cancer Brother    Alcohol abuse Father    Arthritis Sister    Diabetes Sister    Multiple sclerosis Sister    Allergies Other        grandson   Allergic rhinitis Neg Hx    Angioedema Neg Hx    Asthma Neg Hx    Atopy Neg Hx  Eczema Neg Hx    Immunodeficiency Neg Hx    Urticaria Neg Hx     PE: BP 138/76 (BP Location: Left Arm, Patient Position: Sitting, Cuff Size: Normal)   Pulse 88   Ht 5' 3.5" (1.613 m)   Wt 201 lb 3.2 oz (91.3 kg)   SpO2 97%   BMI 35.08 kg/m  Wt Readings from Last 3 Encounters:  05/02/21 201 lb 3.2 oz (91.3 kg)  04/27/21 201 lb 1 oz (91.2 kg)  04/18/21 198 lb 3.2 oz (89.9 kg)   Constitutional: overweight, in NAD Eyes: PERRLA, EOMI, no exophthalmos ENT: moist mucous membranes, no thyromegaly, no cervical lymphadenopathy Cardiovascular: RRR, No MRG Respiratory: CTA B Gastrointestinal: abdomen soft, NT, ND, BS+ Musculoskeletal: no deformities, strength intact in all 4 Skin: moist, warm, no rashes Neurological: no tremor with outstretched hands, DTR normal in all 4  ASSESSMENT: 1.   Graves' disease  2.  Left thyroid nodule  PLAN:  1. Patient with history of thyrotoxicosis consistent with Graves' disease.  She initially had the following thyrotoxic symptoms: Fatigue, heat intolerance, insomnia, but no palpitations, anxiety, hyper defecation.  A thyroid uptake and scan confirmed Graves' disease.  TSI antibodies were elevated.  She was started on methimazole, and we were able to decrease the dose to 2.5 mg daily in 09/2018.  In 04/2020, she was feeling well, and her TFTs were normal, so I advised her to stop the methimazole.  However, afterwards, we had to restart methimazole in 05/2020.  I advised her to start at 2.5 mg daily, but she misunderstood instructions and started at 5 mg.  In 10/2020, I advised her to decrease the dose to 2.5 mg daily.  In 11/2020, we again discussed about stopping methimazole as TSH was higher than 3.  At last visit she was still taking 2.5 mg every day...  -Labs were normal at last visit, but TSH was close to the upper limit of normal so I advised her to decrease the methimazole to 2.5 mg every other day -In 01/2021, we rechecked her TFTs and they were normal, improved, so we stopped methimazole completely.  -No thyrotoxic signs or symptoms -Latest TSI antibodies were elevated in 04/2020 -At today's visit we will recheck her TSH, free T4, free T3 - I will see her back in 1 year  2.  Left thyroid nodule -This appears to be called on her thyroid uptake and scan -Reviewed the thyroid nodule biopsy obtained in 11/2017: Benign -I also reviewed the latest thyroid ultrasound from last year: Nodule was stable in size and no follow-up was needed -No neck compression symptoms except for occasional choking when food gets stuck in her throat.  This is chronic for her. -We will continue to monitor her clinically  Component     Latest Ref Rng & Units 05/02/2021  TSH     0.35 - 5.50 uIU/mL 0.25 (L)  T4,Free(Direct)     0.60 - 1.60 ng/dL 1.09   Triiodothyronine,Free,Serum     2.3 - 4.2 pg/mL 2.6  TSH is slightly low with normal free thyroid hormones.  For now, I would suggest to continue without methimazole but I would like to recheck her test in 4 weeks. Will also add a TSI level then.  Philemon Kingdom, MD PhD [facility] Endocrinology

## 2021-05-03 ENCOUNTER — Telehealth: Payer: Self-pay

## 2021-05-03 NOTE — Telephone Encounter (Addendum)
Called and left VM for pt to call back. ----- Message from Philemon Kingdom, MD sent at 05/02/2021  2:37 PM EDT ----- Can you please call pt.:  TSH is slightly low with normal free thyroid hormones.  For now, I would suggest to continue without methimazole but I would like to recheck her tests in 4 weeks.  I entered her labs.

## 2021-05-03 NOTE — Telephone Encounter (Signed)
Pt called back and was advised to stay off her methimazole and lab appt was scheduled for 4 weeks.

## 2021-05-08 ENCOUNTER — Other Ambulatory Visit: Payer: Self-pay | Admitting: Family Medicine

## 2021-05-08 MED ORDER — ACETAMINOPHEN-CODEINE 300-30 MG PO TABS: 1.0000 | ORAL_TABLET | Freq: Four times a day (QID) | ORAL | 0 refills | Status: DC | PRN

## 2021-05-08 NOTE — Telephone Encounter (Addendum)
Spoke with patient to clarify refill request because Tylenol 650 mg CR is over the counter.  Patient states she is requesting a refill on her Tylenol #3.  She was suppose to bring this up at her [place] on 04/27/21. but forgot. She states her knee is really bothering her.  Patient states the last time is was refilled was back in February.

## 2021-05-08 NOTE — Telephone Encounter (Signed)
  Encourage patient to contact the pharmacy for refills or they can request refills through [place]:  Please schedule appointment if longer than 1 year  NEXT APPOINTMENT DATE:  MEDICATION:acetaminophen (TYLENOL) 650 MG CR tablet   Is the patient out of medication?   [facility] #[id number] Lorina Rabon   Let patient know to contact pharmacy at the end of the day to make sure medication is ready.  Please notify patient to allow 48-72 hours to process  CLINICAL FILLS OUT ALL BELOW:   LAST REFILL:  QTY:  REFILL DATE:    OTHER COMMENTS:    Okay for refill?  Please advise

## 2021-05-12 ENCOUNTER — Telehealth: Payer: Self-pay | Admitting: Family Medicine

## 2021-05-12 NOTE — Telephone Encounter (Signed)
Please look at comments on lab results.  Letter mailed per [name] but call pt  with result comments.

## 2021-05-12 NOTE — Telephone Encounter (Signed)
Pt called wanting to know her lab results on 10/13

## 2021-05-14 ENCOUNTER — Other Ambulatory Visit: Payer: Self-pay | Admitting: Family Medicine

## 2021-05-15 ENCOUNTER — Telehealth: Payer: Self-pay | Admitting: Internal Medicine

## 2021-05-15 NOTE — Telephone Encounter (Signed)
Called patient with results, patient stated understanding and appreciation.

## 2021-05-15 NOTE — Telephone Encounter (Signed)
Pt has a sore inside of her mouth on the bottom jaw behind the teeth on the gums. Pt wants to know if she needs to be seen. Has dry mouth also. Pt contact [phone]

## 2021-05-15 NOTE — Telephone Encounter (Signed)
Please advise. Should pt be following up with PCP or Dentist?

## 2021-05-16 ENCOUNTER — Telehealth: Payer: Self-pay | Admitting: Family Medicine

## 2021-05-16 NOTE — Telephone Encounter (Signed)
Spoke with [name].  She states she is sore around the bottom gum line.  She denies any recent antibiotics, redness or white patches.  She already has appointment scheduled with [name] on Friday.  She will reach out to her dentist to see if they can see her before Friday.  If not, she will keep scheduled appointment with [name] on Friday.

## 2021-05-16 NOTE — Telephone Encounter (Signed)
Noted  

## 2021-05-16 NOTE — Telephone Encounter (Signed)
Left message for [name] to return my call.

## 2021-05-16 NOTE — Telephone Encounter (Signed)
Pt called stating that her mouth is sore inside. Pt wanting to know if Dr Diona Browner would rather her see a dentist or come to see Dr Diona Browner. Pt would like a call at [phone].

## 2021-05-16 NOTE — Telephone Encounter (Signed)
Pt returning your call

## 2021-05-19 ENCOUNTER — Ambulatory Visit: Payer: Medicare Other | Admitting: Family Medicine

## 2021-05-22 DIAGNOSIS — J4550 Severe persistent asthma, uncomplicated: Secondary | ICD-10-CM | POA: Diagnosis not present

## 2021-05-23 ENCOUNTER — Ambulatory Visit (INDEPENDENT_AMBULATORY_CARE_PROVIDER_SITE_OTHER): Payer: Medicare Other | Admitting: *Deleted

## 2021-05-23 ENCOUNTER — Other Ambulatory Visit: Payer: Self-pay

## 2021-05-23 DIAGNOSIS — J4550 Severe persistent asthma, uncomplicated: Secondary | ICD-10-CM | POA: Diagnosis not present

## 2021-05-24 ENCOUNTER — Telehealth: Payer: Self-pay | Admitting: Cardiovascular Disease

## 2021-05-24 NOTE — Telephone Encounter (Signed)
Patient has question about her fluid pills. She would like to speak to nurse about it.

## 2021-05-24 NOTE — Telephone Encounter (Signed)
Spoke with pt, she reports she woke this morning with her left foot feeling heavy. She can not tell if it is swollen or not. She was given the okay to take her furosemide 20 mg daily with her potassium pill for the next 3 days. If after 3 days she has no change she will call back to the office. She denies SOB.

## 2021-05-25 ENCOUNTER — Telehealth: Payer: Self-pay

## 2021-05-25 ENCOUNTER — Ambulatory Visit: Payer: Medicare Other

## 2021-05-25 ENCOUNTER — Other Ambulatory Visit: Payer: Self-pay | Admitting: Allergy and Immunology

## 2021-05-25 ENCOUNTER — Other Ambulatory Visit: Payer: Self-pay

## 2021-05-25 ENCOUNTER — Telehealth (INDEPENDENT_AMBULATORY_CARE_PROVIDER_SITE_OTHER): Payer: Medicare Other | Admitting: Family Medicine

## 2021-05-25 ENCOUNTER — Encounter: Payer: Self-pay | Admitting: Family Medicine

## 2021-05-25 VITALS — Ht 63.5 in | Wt 191.1 lb

## 2021-05-25 DIAGNOSIS — I152 Hypertension secondary to endocrine disorders: Secondary | ICD-10-CM

## 2021-05-25 DIAGNOSIS — R040 Epistaxis: Secondary | ICD-10-CM | POA: Diagnosis not present

## 2021-05-25 DIAGNOSIS — E1159 Type 2 diabetes mellitus with other circulatory complications: Secondary | ICD-10-CM

## 2021-05-25 NOTE — Telephone Encounter (Signed)
Pt walked in and said had video visit with Dr Diona Browner earlier today and pt does not have way to ck BP at home so pt came to [place] for BP ck. Larene Beach CMA will ck BP on nurse visit. Sending note to [facility] CMA.in video visit if BP > 140/90 call Dr Diona Browner.

## 2021-05-25 NOTE — Patient Instructions (Addendum)
If you are  able to check BP to make sure it is not > 140/90.  Do not blow nose or clear throat.  Continue Xarelto, but call if further bleeding.

## 2021-05-25 NOTE — Progress Notes (Signed)
VIRTUAL VISIT Due to national recommendations of social distancing due to [place] 19, a virtual visit is felt to be most appropriate for this patient at this time.   I connected with the patient on 05/25/21 at  9:00 AM EST by virtual telehealth platform and verified that I am speaking with the correct person using two identifiers.   I discussed the limitations, risks, security and privacy concerns of performing an evaluation and management service by  virtual telehealth platform and the availability of in person appointments. I also discussed with the patient that there may be a patient responsible charge related to this service. The patient expressed understanding and agreed to proceed.  Patient location: Home Provider Location: [place] [facility] Participants: [name] and [name]   Chief Complaint  Patient presents with   Dry Throat    When cleared throat this morning there bloody mucus.  Also blew nose this morning and there was blood there as well    History of Present Illness:  75 year old female with history of moderate persistent asthma , OSA, DM and atrial fibrillation on Xarelto anticoagulation presents with new onset bloody mucus discharge from throat.   She reports  new onset this Am of mucus in throat.. cleared throat... saw bloody mucus. Blew nose after this and blood on tissue.  No ST, no HA, no further bloody nose.  No decongestant use.   No SOB, no asthma exacerbation.   Not able to check BP.. cannot find cuff. BP Readings from Last 3 Encounters:  05/02/21 138/76  04/27/21 120/70  03/14/21 100/60      She has had more mouth dryness lately using Biotine.  COVID 19 screen No recent travel or known exposure to COVID19 The patient denies respiratory symptoms of COVID 19 at this time.  The importance of social distancing was discussed today.   Review of Systems  Constitutional:  Negative for chills and fever.  HENT:  Positive for congestion. Negative  for ear pain.   Eyes:  Negative for pain and redness.  Respiratory:  Negative for cough and shortness of breath.   Cardiovascular:  Negative for chest pain, palpitations and leg swelling.  Gastrointestinal:  Negative for abdominal pain, blood in stool, constipation, diarrhea, nausea and vomiting.  Genitourinary:  Negative for dysuria.  Musculoskeletal:  Negative for falls and myalgias.  Skin:  Negative for rash.  Neurological:  Negative for dizziness.  Psychiatric/Behavioral:  Negative for depression. The patient is not nervous/anxious.      Past Medical History:  Diagnosis Date   Allergic rhinitis    Allergy    Arthritis    Asthma    Chronic headache    Diabetes mellitus    DVT (deep venous thrombosis) (HCC)    Dyspnea    Heart murmur    Hypertension    Penicillin allergy 01/19/2020   Pulmonary embolism ([place])    Pulmonary hypertension ([place])    Seizures ([place])     reports that she has never smoked. She has never used smokeless tobacco. She reports that she does not currently use alcohol. She reports that she does not use drugs.   Current Outpatient Medications:    acetaminophen (TYLENOL) 650 MG CR tablet, Take 1,300 mg by mouth daily as needed for pain., Disp: , Rfl:    Acetaminophen-Codeine 300-30 MG tablet, Take 1 tablet by mouth every 6 (six) hours as needed for pain., Disp: 30 tablet, Rfl: 0   ADVAIR HFA 230-21 MCG/ACT inhaler, INHALE  2 PUFFS BY MOUTH TWICE DAILY TO PREVENT COUGH OR WHEEZING, RINSE MOUTH AFTER USE, Disp: 12 g, Rfl: 4   albuterol (VENTOLIN HFA) 108 (90 Base) MCG/ACT inhaler, INHALE 2 PUFFS BY MOUTH EVERY 6 HOURS AS NEEDED FOR WHEEZING OR SHORTNESS OF BREATH, Disp: 54 g, Rfl: 0   atorvastatin (LIPITOR) 10 MG tablet, TAKE 1 TABLET(10 MG) BY MOUTH DAILY, Disp: 90 tablet, Rfl: 3   Blood Glucose Monitoring Suppl (ONETOUCH VERIO) w/Device KIT, Use to check blood sugar up to 2 times a day, Disp: 1 kit, Rfl: 0   cyclobenzaprine (FLEXERIL) 10 MG tablet, TAKE 1 TABLET(10  MG) BY MOUTH AT BEDTIME AS NEEDED FOR MUSCLE SPASMS, Disp: 30 tablet, Rfl: 1   dextromethorphan-guaiFENesin (MUCINEX DM) 30-600 MG 12hr tablet, Take 1 tablet by mouth 2 (two) times daily., Disp: , Rfl:    diclofenac Sodium (VOLTAREN) 1 % GEL, Apply 2 g topically 4 (four) times daily., Disp: 400 g, Rfl: 1   DM-APAP-CPM (CORICIDIN HBP PO), Take 1 tablet by mouth daily as needed (cough/cold)., Disp: , Rfl:    EPINEPHrine 0.3 mg/0.3 mL IJ SOAJ injection, USE AS DIRECTED FOR LIFE THREATENING ALLERGIC REACTIONS, Disp: 2 each, Rfl: 2   famotidine (PEPCID) 40 MG tablet, TAKE 1 TABLET(40 MG) BY MOUTH DAILY, Disp: 30 tablet, Rfl: 2   fluticasone (FLOVENT HFA) 220 MCG/ACT inhaler, INHALE 2 PUFFS BY MOUTH TWICE DAILY, DURING FLARE UP AS NEEDED. RINSE, GARGLE AND SPIT AFTER USE, Disp: 36 g, Rfl: 1   furosemide (LASIX) 20 MG tablet, Take 1 tablet (20 mg total) by mouth as needed for fluid (Take one daily as needed for swelling)., Disp: 90 tablet, Rfl: 3   gabapentin (NEURONTIN) 600 MG tablet, TAKE 1 TABLET BY MOUTH EVERY DAY AT BREAKFAST, 1 TABLET AT LUNCH AND 2 TABLETS AT BEDTIME, Disp: 360 tablet, Rfl: 1   GARLIC PO, Take 1 tablet by mouth daily., Disp: , Rfl:    GLYXAMBI 10-5 MG TABS, TAKE 1 TABLET BY MOUTH DAILY, Disp: 30 tablet, Rfl: 11   Homeopathic Products (THERAWORX RELIEF EX), Apply 1 spray topically daily as needed (muscle cramps)., Disp: , Rfl:    hydrOXYzine (ATARAX/VISTARIL) 10 MG tablet, Take 1 tablet (10 mg total) by mouth daily as needed for itching., Disp: 30 tablet, Rfl: 2   ipratropium (ATROVENT) 0.06 % nasal spray, USE 1 TO 2 SPRAYS IN EACH NOSTRIL 1 TO 2 TIMES PER DAY, Disp: 15 mL, Rfl: 3   irbesartan (AVAPRO) 150 MG tablet, TAKE 1 TABLET(150 MG) BY MOUTH DAILY, Disp: 30 tablet, Rfl: 8   Lactase (DAIRY-RELIEF PO), Take 1 capsule by mouth daily as needed (eating dairy). , Disp: , Rfl:    Lancets (ONETOUCH DELICA PLUS [id number]) MISC, USE TO CHECK BLOOD SUGAR UP TO TWICE DAILY, Disp: 100 each,  Rfl: 5   levalbuterol (XOPENEX) 1.25 MG/3ML nebulizer solution, USE 1 VIAL VIA NEBULIZER EVERY 6 HOURS AS NEEDED FOR SHORTNESS OF BREATH OR WHEEZING, Disp: 900 mL, Rfl: 1   liver oil-zinc oxide (DESITIN) 40 % ointment, Apply 1 application topically as needed for irritation., Disp: , Rfl:    meclizine (ANTIVERT) 12.5 MG tablet, Take 1 tablet (12.5 mg total) by mouth 3 (three) times daily as needed for dizziness., Disp: 15 tablet, Rfl: 0   Menthol-Methyl Salicylate (SALONPAS JET SPRAY EX), Apply 1 spray topically daily as needed (knee pain)., Disp: , Rfl:    Misc Natural Products (NEURIVA PO), Take 2 tablets by mouth daily., Disp: , Rfl:  montelukast (SINGULAIR) 10 MG tablet, TAKE 1 TABLET(10 MG) BY MOUTH DAILY, Disp: 90 tablet, Rfl: 1   ondansetron (ZOFRAN-ODT) 4 MG disintegrating tablet, Take 1 tablet (4 mg total) by mouth every 8 (eight) hours as needed for nausea or vomiting., Disp: 20 tablet, Rfl: 0   ONETOUCH VERIO test strip, USE TO CHECK BLOOD SUGAR UP TO TWICE DAILY, Disp: 100 strip, Rfl: 5   oxybutynin (DITROPAN-XL) 10 MG 24 hr tablet, Take 10 mg by mouth daily., Disp: , Rfl:    pantoprazole (PROTONIX) 40 MG tablet, TAKE 1 TABLET(40 MG) BY MOUTH DAILY, Disp: 90 tablet, Rfl: 0   PATADAY 0.2 % SOLN, Place 1 drop into both eyes daily as needed (allergies). , Disp: , Rfl: 4   Polyethyl Glycol-Propyl Glycol (SYSTANE OP), Place 1 drop into both eyes in the morning and at bedtime., Disp: , Rfl:    potassium chloride SA (KLOR-CON) 20 MEQ tablet, Take 1 tablet (20 mEq total) by mouth as needed (Take one daily when you need to take a Furosemide)., Disp: 30 tablet, Rfl: 0   Tiotropium Bromide Monohydrate (SPIRIVA RESPIMAT) 1.25 MCG/ACT AERS, INHALE 2 PUFFS INTO THE LUNGS DAILY, Disp: 4 g, Rfl: 3   TRULANCE 3 MG TABS, Take 3 mg by mouth every other day., Disp: , Rfl:    Turmeric 500 MG CAPS, Take 1,000 mg by mouth daily., Disp: , Rfl:    XARELTO 20 MG TABS tablet, TAKE 1 TABLET(20 MG) BY MOUTH DAILY  WITH SUPPER, Disp: 90 tablet, Rfl: 1  Current Facility-Administered Medications:    omalizumab Arvid Right) injection 300 mg, 300 mg, Subcutaneous, Q28 days, Kozlow, Donnamarie Poag, MD, 300 mg at 05/23/21 1129   Observations/Objective: Height 5' 3.5" (1.613 m), weight 191 lb 2 oz (86.7 kg).  Physical Exam  Physical Exam Constitutional:      General: The patient is not in acute distress. Pulmonary:     Effort: Pulmonary effort is normal. No respiratory distress.  Neurological:     Mental Status: The patient is alert and oriented to person, place, and time.  Psychiatric:        Mood and Affect: Mood normal.        Behavior: Behavior normal.   Assessment and Plan Problem List Items Addressed This Visit     Epistaxis - Primary    Self limited, likely due to aggressive throat clearing , nose blowing and presence of Xarelto use and dry mucus membranes.   Recommended checking BP.. call if > 140/90.  Hold blowing nose, clearing throat to avoid dislodging cough.   Given self limited continue blood thinner, but call if further bleeding.   No lower respiratory symptoms, no asthma symptoms.         I discussed the assessment and treatment plan with the patient. The patient was provided an opportunity to ask questions and all were answered. The patient agreed with the plan and demonstrated an understanding of the instructions.   The patient was advised to call back or seek an in-person evaluation if the symptoms worsen or if the condition fails to improve as anticipated.     [name], MD

## 2021-05-25 NOTE — Progress Notes (Signed)
Pt walked in the [place] office stating Dr Diona Browner asked her to have her BP checked. She does not have a cuff at home, so she came here.  BP was 106/72.

## 2021-05-25 NOTE — Assessment & Plan Note (Signed)
Self limited, likely due to aggressive throat clearing , nose blowing and presence of Xarelto use and dry mucus membranes.   Recommended checking BP.. call if > 140/90.  Hold blowing nose, clearing throat to avoid dislodging cough.   Given self limited continue blood thinner, but call if further bleeding.   No lower respiratory symptoms, no asthma symptoms.

## 2021-05-26 ENCOUNTER — Ambulatory Visit: Payer: Medicare Other | Admitting: Family Medicine

## 2021-05-29 ENCOUNTER — Other Ambulatory Visit: Payer: Self-pay | Admitting: Allergy and Immunology

## 2021-05-30 ENCOUNTER — Other Ambulatory Visit (INDEPENDENT_AMBULATORY_CARE_PROVIDER_SITE_OTHER): Payer: Medicare Other

## 2021-05-30 ENCOUNTER — Ambulatory Visit: Payer: Medicare Other | Admitting: Internal Medicine

## 2021-05-30 ENCOUNTER — Other Ambulatory Visit: Payer: Self-pay

## 2021-05-30 DIAGNOSIS — R110 Nausea: Secondary | ICD-10-CM | POA: Diagnosis not present

## 2021-05-30 DIAGNOSIS — E0500 Thyrotoxicosis with diffuse goiter without thyrotoxic crisis or storm: Secondary | ICD-10-CM | POA: Diagnosis not present

## 2021-05-30 DIAGNOSIS — R194 Change in bowel habit: Secondary | ICD-10-CM | POA: Diagnosis not present

## 2021-05-30 LAB — TSH: TSH: 0.13 u[IU]/mL — ABNORMAL LOW (ref 0.35–5.50)

## 2021-05-30 LAB — T3, FREE: T3, Free: 3 pg/mL (ref 2.3–4.2)

## 2021-05-30 LAB — T4, FREE: Free T4: 1.49 ng/dL (ref 0.60–1.60)

## 2021-06-01 ENCOUNTER — Telehealth: Payer: Self-pay

## 2021-06-01 LAB — THYROID STIMULATING IMMUNOGLOBULIN: TSI: 485 % baseline — ABNORMAL HIGH (ref <140)

## 2021-06-01 MED ORDER — METHIMAZOLE 5 MG PO TABS: 5.0000 mg | ORAL_TABLET | Freq: Every day | ORAL | 3 refills | Status: DC

## 2021-06-01 NOTE — Telephone Encounter (Signed)
It does not look like these supplements would interfere with the thyroid tests.

## 2021-06-01 NOTE — Assessment & Plan Note (Signed)
Stable, rate controlled, followed by cardiology.

## 2021-06-01 NOTE — Progress Notes (Signed)
Patient has now been informed and lab appt has been made

## 2021-06-01 NOTE — Assessment & Plan Note (Signed)
Due for re-eval. 

## 2021-06-01 NOTE — Assessment & Plan Note (Signed)
Stable

## 2021-06-01 NOTE — Assessment & Plan Note (Signed)
Due to DM.  Stable control on current regimen.

## 2021-06-01 NOTE — Assessment & Plan Note (Signed)
Stable control. 

## 2021-06-01 NOTE — Assessment & Plan Note (Signed)
Stable, chronic.  Continue current medication. Euvolemic.  avapro 150 mg daily  lasix 20 mg daily

## 2021-06-01 NOTE — Telephone Encounter (Signed)
Patient call wanting to inquire about whether it is okay for her to begin taking a diet pill by the name of ACV+ Keto gummies which helps with wellness support and weight loss. She wants to make sure that the ingredients wont have a negative affect

## 2021-06-02 NOTE — Telephone Encounter (Signed)
Patient has been informed.

## 2021-06-03 DIAGNOSIS — M79672 Pain in left foot: Secondary | ICD-10-CM | POA: Diagnosis not present

## 2021-06-06 ENCOUNTER — Ambulatory Visit (INDEPENDENT_AMBULATORY_CARE_PROVIDER_SITE_OTHER): Payer: Medicare Other | Admitting: Allergy and Immunology

## 2021-06-06 ENCOUNTER — Ambulatory Visit: Payer: Medicare Other

## 2021-06-06 ENCOUNTER — Other Ambulatory Visit: Payer: Self-pay

## 2021-06-06 ENCOUNTER — Ambulatory Visit: Payer: Medicare Other | Admitting: Podiatry

## 2021-06-06 ENCOUNTER — Encounter: Payer: Self-pay | Admitting: Allergy and Immunology

## 2021-06-06 VITALS — BP 122/68 | HR 98 | Temp 97.8°F | Resp 16 | Ht 64.0 in | Wt 202.0 lb

## 2021-06-06 DIAGNOSIS — R143 Flatulence: Secondary | ICD-10-CM

## 2021-06-06 DIAGNOSIS — J3089 Other allergic rhinitis: Secondary | ICD-10-CM | POA: Diagnosis not present

## 2021-06-06 DIAGNOSIS — K219 Gastro-esophageal reflux disease without esophagitis: Secondary | ICD-10-CM

## 2021-06-06 DIAGNOSIS — T781XXA Other adverse food reactions, not elsewhere classified, initial encounter: Secondary | ICD-10-CM | POA: Diagnosis not present

## 2021-06-06 DIAGNOSIS — E1142 Type 2 diabetes mellitus with diabetic polyneuropathy: Secondary | ICD-10-CM

## 2021-06-06 DIAGNOSIS — J4550 Severe persistent asthma, uncomplicated: Secondary | ICD-10-CM

## 2021-06-06 MED ORDER — FLUTICASONE PROPIONATE HFA 220 MCG/ACT IN AERO: INHALATION_SPRAY | RESPIRATORY_TRACT | 5 refills | Status: DC

## 2021-06-06 MED ORDER — NYSTATIN 100000 UNIT/ML MT SUSP: OROMUCOSAL | 5 refills | Status: DC

## 2021-06-06 MED ORDER — ADVAIR HFA 230-21 MCG/ACT IN AERO: 2.0000 | INHALATION_SPRAY | Freq: Two times a day (BID) | RESPIRATORY_TRACT | 5 refills | Status: DC

## 2021-06-06 MED ORDER — SPIRIVA RESPIMAT 1.25 MCG/ACT IN AERS
2.0000 | INHALATION_SPRAY | Freq: Every day | RESPIRATORY_TRACT | 5 refills | Status: DC
Start: 2021-06-06 — End: 2021-07-19

## 2021-06-06 NOTE — Progress Notes (Signed)
[place] [facility] [place] [facility] [place]   Follow-up Note  Referring Provider: [name], MD Primary Provider: [name], MD Date of Office Visit: 06/06/2021  Subjective:   [name] (DOB: 1946/03/25) is a 75 y.o. female who returns to the [facility] on 06/06/2021 in re-evaluation of the following:  HPI: Alexas returns to this clinic in evaluation of asthma, allergic rhinitis, LPR, history of pulmonary hypertension and diastolic dysfunction.  I last saw in this clinic on 29 Nov 2020.  Her airway has really been doing very well and she does not have any significant problem requiring her to use albuterol and she has not required a systemic steroid or an antibiotic for any type of airway issue.  She continues to use Advair mostly 1 time per day and Spiriva on a consistent basis as well as montelukast on a consistent basis.  She did develop an episode of epistaxis about 2 weeks ago affecting her left nostril that lasted about 1 day.  She intermittently uses her nasal ipratropium and she has not been using that agent since that point.  She occasionally has issues with thrush but fortunately over the course of the past 6 months or so she has not had to reinstate use of nystatin after using her Advair.  Her reflux is under pretty good control on her current plan of a proton pump inhibitor and H2 receptor blocker.  She does complain of having a lot of gas and flatulence on a chronic basis.  She has received 4 COVID vaccines and a flu vaccine this year.  She may have had reaction to shrimp.  She went to [employer] this summer and had grilled shrimp and within 30 minutes started to develop an upset stomach and then had vomiting and diarrhea that extended past the day.  Her next consumption of shrimp occurred in September 2022 which she ate [place] food and she had shrimp fried rice and the same reaction occurred.  She had no other associated  systemic or constitutional symptoms.  She has been without consumption of any seafood since that point in time.  Allergies as of 06/06/2021       Reactions   Latex Hives   Penicillins Swelling   Has patient had a PCN reaction causing immediate rash, facial/tongue/throat swelling, SOB or lightheadedness with hypotension patient had a PCN reaction causing severe rash involving mucus membranes or skin necrosis: [id number]} Has patient had a PCN reaction that required hospitalization/No Has patient had a PCN reaction occurring within the last 10 years: No If all of the above answers are "NO", then may proceed with Cephalosporin use.   Oxycodone-acetaminophen Nausea And Vomiting   Lyrica [pregabalin] Other (See Comments)   Blurred vision.   Nickel Rash   Phenytoin Sodium Extended Swelling   Vicodin [hydrocodone-acetaminophen] Nausea And Vomiting        Medication List    acetaminophen 650 MG CR tablet Commonly known as: TYLENOL Take 1,300 mg by mouth daily as needed for pain.   Acetaminophen-Codeine 300-30 MG tablet Take 1 tablet by mouth every 6 (six) hours as needed for pain.   Advair HFA 230-21 MCG/ACT inhaler Generic drug: fluticasone-salmeterol Inhale 2 puffs into the lungs 2 (two) times daily.   albuterol 108 (90 Base) MCG/ACT inhaler Commonly known as: VENTOLIN HFA INHALE 2 PUFFS BY MOUTH EVERY 6 HOURS AS NEEDED FOR WHEEZING OR SHORTNESS OF BREATH   atorvastatin 10 MG tablet Commonly  known as: LIPITOR TAKE 1 TABLET(10 MG) BY MOUTH DAILY   CORICIDIN HBP PO Take 1 tablet by mouth daily as needed (cough/cold).   cyclobenzaprine 10 MG tablet Commonly known as: FLEXERIL TAKE 1 TABLET(10 MG) BY MOUTH AT BEDTIME AS NEEDED FOR MUSCLE SPASMS   DAIRY-RELIEF PO Take 1 capsule by mouth daily as needed (eating dairy).   dextromethorphan-guaiFENesin 30-600 MG 12hr tablet Commonly known as: MUCINEX DM Take 1 tablet by mouth 2 (two) times daily.   diclofenac Sodium 1 %  Gel Commonly known as: Voltaren Apply 2 g topically 4 (four) times daily.   EPINEPHrine 0.3 mg/0.3 mL Soaj injection Commonly known as: EPI-PEN USE AS DIRECTED FOR LIFE THREATENING ALLERGIC REACTIONS   famotidine 40 MG tablet Commonly known as: PEPCID TAKE 1 TABLET(40 MG) BY MOUTH DAILY   fluticasone 220 MCG/ACT inhaler Commonly known as: FLOVENT HFA INHALE 2 PUFFS BY MOUTH TWICE DAILY, DURING FLARE UP AS NEEDED. RINSE, GARGLE AND SPIT AFTER USE   furosemide 20 MG tablet Commonly known as: LASIX Take 1 tablet (20 mg total) by mouth as needed for fluid (Take one daily as needed for swelling).   gabapentin 600 MG tablet Commonly known as: NEURONTIN TAKE 1 TABLET BY MOUTH EVERY DAY AT BREAKFAST, 1 TABLET AT LUNCH AND 2 TABLETS AT BEDTIME   GARLIC PO Take 1 tablet by mouth daily.   Glyxambi 10-5 MG Tabs Generic drug: Empagliflozin-linaGLIPtin TAKE 1 TABLET BY MOUTH DAILY   hydrOXYzine 10 MG tablet Commonly known as: ATARAX/VISTARIL Take 1 tablet (10 mg total) by mouth daily as needed for itching.   ipratropium 0.06 % nasal spray Commonly known as: ATROVENT USE 1 TO 2 SPRAYS IN EACH NOSTRIL 1 TO 2 TIMES PER DAY   irbesartan 150 MG tablet Commonly known as: AVAPRO TAKE 1 TABLET(150 MG) BY MOUTH DAILY   levalbuterol 1.25 MG/3ML nebulizer solution Commonly known as: XOPENEX USE 1 VIAL VIA NEBULIZER EVERY 6 HOURS AS NEEDED FOR SHORTNESS OF BREATH OR WHEEZING   liver oil-zinc oxide 40 % ointment Commonly known as: DESITIN Apply 1 application topically as needed for irritation.   meclizine 12.5 MG tablet Commonly known as: ANTIVERT Take 1 tablet (12.5 mg total) by mouth 3 (three) times daily as needed for dizziness.   methimazole 5 MG tablet Commonly known as: TAPAZOLE Take 1 tablet (5 mg total) by mouth daily.   montelukast 10 MG tablet Commonly known as: SINGULAIR TAKE 1 TABLET(10 MG) BY MOUTH DAILY   NEURIVA PO Take 2 tablets by mouth daily.   ondansetron 4  MG disintegrating tablet Commonly known as: ZOFRAN-ODT Take 1 tablet (4 mg total) by mouth every 8 (eight) hours as needed for nausea or vomiting.   OneTouch Delica Plus [id number] Misc USE TO CHECK BLOOD SUGAR UP TO TWICE DAILY   OneTouch Verio test strip Generic drug: glucose blood USE TO CHECK BLOOD SUGAR UP TO TWICE DAILY   OneTouch Verio w/Device Kit Use to check blood sugar up to 2 times a day   oxybutynin 10 MG 24 hr tablet Commonly known as: DITROPAN-XL Take 10 mg by mouth daily.   pantoprazole 40 MG tablet Commonly known as: PROTONIX TAKE 1 TABLET(40 MG) BY MOUTH DAILY   Pataday 0.2 % Soln Generic drug: Olopatadine HCl Place 1 drop into both eyes daily as needed (allergies).   potassium chloride SA 20 MEQ tablet Commonly known as: KLOR-CON Take 1 tablet (20 mEq total) by mouth as needed (Take one daily when you need to  take a Furosemide).   SALONPAS JET SPRAY EX Apply 1 spray topically daily as needed (knee pain).   Spiriva Respimat 1.25 MCG/ACT Aers Generic drug: Tiotropium Bromide Monohydrate Inhale 2 puffs into the lungs daily.   SYSTANE OP Place 1 drop into both eyes in the morning and at bedtime.   IPJASNKNL RELIEF EX Apply 1 spray topically daily as needed (muscle cramps).   tolterodine 4 MG 24 hr capsule Commonly known as: DETROL LA Take 4 mg by mouth daily.   Trulance 3 MG Tabs Generic drug: Plecanatide Take 3 mg by mouth every other day.   Turmeric 500 MG Caps Take 1,000 mg by mouth daily.   Xarelto 20 MG Tabs tablet Generic drug: rivaroxaban TAKE 1 TABLET(20 MG) BY MOUTH DAILY WITH SUPPER        Past Medical History:  Diagnosis Date   Allergic rhinitis    Allergy    Arthritis    Asthma    Chronic headache    Diabetes mellitus    DVT (deep venous thrombosis) (HCC)    Dyspnea    Heart murmur    Hypertension    Penicillin allergy 01/19/2020   Pulmonary embolism (HCC)    Pulmonary hypertension (HCC)    Seizures (HCC)     Past  Surgical History:  Procedure Laterality Date   ABDOMINAL HYSTERECTOMY     partial, has ovaries   CARDIAC CATHETERIZATION  12/28/2010   Mod. pulmonary hypertension, normal coronary arteries   CARDIOVERSION N/A 11/23/2020   Procedure: CARDIOVERSION;  Surgeon: Sanda Klein, MD;  Location: [facility];  Service: Cardiovascular;  Laterality: N/A;   DOPPLER ECHOCARDIOGRAPHY  10/08/2011   EF=>55%,mild asymmetric LVH, mod. TR, mod. PH, mild to mod LA dilatation   IR LUMBAR DISC ASPIRATION W/IMG GUIDE  01/12/2020   KNEE ARTHROSCOPY Left    KNEE SURGERY     Nuclear Stress Test  05/20/2006   No ischemia   PARTIAL HYSTERECTOMY     PLANTAR FASCIA SURGERY     TONSILLECTOMY      Review of systems negative except as noted in HPI / PMHx or noted below:  Review of Systems  Constitutional: Negative.   HENT: Negative.    Eyes: Negative.   Respiratory: Negative.    Cardiovascular: Negative.   Gastrointestinal: Negative.   Genitourinary: Negative.   Musculoskeletal: Negative.   Skin: Negative.   Neurological: Negative.   Endo/Heme/Allergies: Negative.   Psychiatric/Behavioral: Negative.      Objective:   Vitals:   06/06/21 1206  BP: 122/68  Pulse: 98  Resp: 16  Temp: 97.8 F (36.6 C)  SpO2: 97%   Height: '5\' 4"'  (162.6 cm)  Weight: 202 lb (91.6 kg)   Physical Exam Constitutional:      Appearance: She is not diaphoretic.  HENT:     Head: Normocephalic.     Right Ear: Tympanic membrane, ear canal and external ear normal.     Left Ear: Tympanic membrane, ear canal and external ear normal.     Nose: Nose normal. No mucosal edema or rhinorrhea.     Mouth/Throat:     Pharynx: Uvula midline. No oropharyngeal exudate.  Eyes:     Conjunctiva/sclera: Conjunctivae normal.  Neck:     Thyroid: No thyromegaly.     Trachea: Trachea normal. No tracheal tenderness or tracheal deviation.  Cardiovascular:     Rate and Rhythm: Normal rate and regular rhythm.     Heart sounds: S1 normal and S2  normal. Murmur (Systolic) heard.  Pulmonary:     Effort: No respiratory distress.     Breath sounds: Normal breath sounds. No stridor. No wheezing or rales.  Lymphadenopathy:     Head:     Right side of head: No tonsillar adenopathy.     Left side of head: No tonsillar adenopathy.     Cervical: No cervical adenopathy.  Skin:    Findings: No erythema or rash.     Nails: There is no clubbing.  Neurological:     Mental Status: She is alert.    Diagnostics:    Spirometry was performed and demonstrated an FEV1 of 1.45 at 81 % of predicted.  Assessment and Plan:   1. Asthma, severe persistent, well-controlled   2. Other allergic rhinitis   3. Adverse food reaction, initial encounter   4. LPRD (laryngopharyngeal reflux disease)   5. Gastroesophageal reflux disease, unspecified whether esophagitis present   6. Flatulence     1. Continue Advair 230 - 2 inhalations 1-2 times a day depending on disease activity  2. Continue Spiriva 2.5 respimat - two inhalations one time per day.  3. Continue Montelukast 44m - one tablet one time per day.  4. Continue Xolair (and Epi-Pen)  5. Continue pantoprazole 436min the AM and Famotidine 40 mg in the PM  6. If needed:   A. Nasal saline several times per day  B. Mucinex DM two times per day  C. Ipratropium 0.06% -1-2 sprays each nostril 1-2 times per day to dry nose  D. Proventil HFA or albuterol nebulization  E. nystatin 5 mL swish and swallow after Advair 2 times a day  7. Add Flovent 220 - 2 inhalations 2 times a day during "FLARE UP"  8. Blood for shellfish panel and fish panel. Further evaluation???  9. Return to clinic in 6 months or earlier if there is a problem  MoJerseeas done very well from an airway standpoint and we will continue to have her use Advair and Spiriva and montelukast and Xolair to address this issue.  Her reflux is under very good control but she does have some chronic gas and flatulence and we will see if  removal of her proton pump inhibitor at this point will eliminate that issue while still maintaining good control of her reflux while using an H2 receptor blocker.  We need to work through her possible seafood hypersensitivity by checking for antigen specific IgE antibodies.  I will contact her with the results of those blood test once they are available for review.  We may need to challenge her with those food products at some point in the near future.  ErAllena KatzMD Allergy / Immunology [place]

## 2021-06-06 NOTE — Progress Notes (Signed)
SITUATION Reason for Visit: Fitting of Diabetic Shoes & Insoles Patient / Caregiver Report:  Patient is very satisfied with fit and function of shoes.   OBJECTIVE DATA: Patient History / Diagnosis:  Diabetes mellitus Change in Status:   None  ACTIONS PERFORMED: In-Person Delivery, patient was fit with: - 1x pair A5500 PDAC approved prefabricated Diabetic Shoes: Karle Starch style - 3x pair [phone] PDAC approved CAM milled custom diabetic insoles  Shoes and insoles were verified for structural integrity and safety. Patient wore shoes and insoles in office. Skin was inspected and free of areas of concern after wearing shoes and inserts. Shoes and inserts fit properly. Patient / Caregiver provided with ferbal instruction and demonstration regarding donning, doffing, wear, care, proper fit, function, purpose, cleaning, and use of shoes and insoles ' and in all related precautions and risks and benefits regarding shoes and insoles. Patient / Caregiver was instructed to wear properly fitting socks with shoes at all times. Patient was also provided with verbal instruction regarding how to report any failures or malfunctions of shoes or inserts, and necessary follow up care. Patient / Caregiver was also instructed to contact physician regarding change in status that may affect function of shoes and inserts.   Patient / Caregiver verbalized undersatnding of instruction provided. Patient / Caregiver demonstrated independence with proper donning and doffing of shoes and inserts.  PLAN Patient to follow up as needed. Plan of care was discussed with and agreed upon by patient and/or caregiver. All questions were answered and concerns addressed.

## 2021-06-06 NOTE — Patient Instructions (Addendum)
  1. Continue Advair 230 - 2 inhalations 1-2 times a day depending on disease activity  2. Continue Spiriva 2.5 respimat - two inhalations one time per day.  3. Continue Montelukast 10mg  - one tablet one time per day.  4. Continue Xolair (and Epi-Pen)  5. Continue pantoprazole 40mg  in the AM and Famotidine 40 mg in the PM. Can try to discontinue pantoprazole to see if this helps with gas production.  6. If needed:   A. Nasal saline several times per day  B. Mucinex DM two times per day  C. Ipratropium 0.06% -1-2 sprays each nostril 1-2 times per day to dry nose  D. Proventil HFA or albuterol nebulization  E. nystatin 5 mL swish and swallow after Advair 2 times a day  7. Add Flovent 220 - 2 inhalations 2 times a day during "FLARE UP"  8. Blood for shellfish panel and fish panel. Further evaluation???  9. Return to clinic in 6 months or earlier if there is a problem.

## 2021-06-07 ENCOUNTER — Encounter: Payer: Self-pay | Admitting: Allergy and Immunology

## 2021-06-10 LAB — ALLERGEN PROFILE, SHELLFISH
Clam IgE: <0.1 kU/L
F023-IgE Crab: <0.1 kU/L
F080-IgE Lobster: <0.1 kU/L
F290-IgE Oyster: <0.1 kU/L
Scallop IgE: <0.1 kU/L
Shrimp IgE: <0.1 kU/L

## 2021-06-10 LAB — ALLERGEN PROFILE, FOOD-FISH
Allergen Mackerel IgE: <0.1 kU/L
Allergen Salmon IgE: <0.1 kU/L
Allergen Trout IgE: <0.1 kU/L
Allergen Walley Pike IgE: <0.1 kU/L
Codfish IgE: <0.1 kU/L
Halibut IgE: <0.1 kU/L
Tuna: <0.1 kU/L

## 2021-06-12 ENCOUNTER — Telehealth: Payer: Self-pay | Admitting: Allergy and Immunology

## 2021-06-12 NOTE — Telephone Encounter (Signed)
Spoke with patient, inform her of Dr. Bruna Potter recommendation to use "nystatin 5 mL swish and swallow after Advair 2 times a day as needed". Patient verbalized understanding and wanted to be sure since she usually gets a smaller bottle and this time she received a bigger bottle. Patient wasted to be sure she didn't need to use it every day. I informed patient that it is only as needed.

## 2021-06-12 NOTE — Telephone Encounter (Signed)
Patient saw Dr. Neldon Mc on 06/06/21. He prescribed her Nystatin. She would like to know what this is for.

## 2021-06-13 DIAGNOSIS — M722 Plantar fascial fibromatosis: Secondary | ICD-10-CM | POA: Diagnosis not present

## 2021-06-16 ENCOUNTER — Other Ambulatory Visit: Payer: Self-pay | Admitting: Family Medicine

## 2021-06-16 ENCOUNTER — Telehealth: Payer: Self-pay | Admitting: Family Medicine

## 2021-06-16 MED ORDER — FLUCONAZOLE 150 MG PO TABS: 150.0000 mg | ORAL_TABLET | Freq: Once | ORAL | 0 refills | Status: AC

## 2021-06-16 NOTE — Telephone Encounter (Signed)
Appt can be cancelled. I will send in fluconazole.

## 2021-06-16 NOTE — Telephone Encounter (Signed)
Pt called in requesting a call back regarding rush in groin area . Appointment already made . Please advise [phone]

## 2021-06-16 NOTE — Telephone Encounter (Signed)
Spoke with [name].  She states she has broken out with a yeast infection.  She states she always gets a yeast infection after taking a new medication.  She was put on prednisone last week and now she is having vaginal itching.  She has been using Desitin Cream with no relief.  She is asking if Dr. Diona Browner would send her in a prescription without having to be seen next week.  She did go ahead and make an appointment for next Tuesday but would prefer not to come in if possible.

## 2021-06-17 NOTE — Telephone Encounter (Signed)
Called patient informed medication at pharmacy and cancelled appointment as instructed. No further questions at this time.

## 2021-06-19 DIAGNOSIS — J4550 Severe persistent asthma, uncomplicated: Secondary | ICD-10-CM | POA: Diagnosis not present

## 2021-06-20 ENCOUNTER — Ambulatory Visit (INDEPENDENT_AMBULATORY_CARE_PROVIDER_SITE_OTHER): Payer: Medicare Other

## 2021-06-20 ENCOUNTER — Ambulatory Visit: Payer: Medicare Other | Admitting: Family Medicine

## 2021-06-20 ENCOUNTER — Other Ambulatory Visit: Payer: Self-pay

## 2021-06-20 DIAGNOSIS — J4550 Severe persistent asthma, uncomplicated: Secondary | ICD-10-CM

## 2021-06-23 ENCOUNTER — Encounter: Payer: Self-pay | Admitting: Cardiovascular Disease

## 2021-06-23 ENCOUNTER — Other Ambulatory Visit: Payer: Self-pay

## 2021-06-23 ENCOUNTER — Ambulatory Visit (INDEPENDENT_AMBULATORY_CARE_PROVIDER_SITE_OTHER): Payer: Medicare Other | Admitting: Cardiovascular Disease

## 2021-06-23 VITALS — BP 122/64 | HR 81 | Ht 64.0 in | Wt 198.4 lb

## 2021-06-23 DIAGNOSIS — I2721 Secondary pulmonary arterial hypertension: Secondary | ICD-10-CM

## 2021-06-23 DIAGNOSIS — E0590 Thyrotoxicosis, unspecified without thyrotoxic crisis or storm: Secondary | ICD-10-CM

## 2021-06-23 DIAGNOSIS — Z86711 Personal history of pulmonary embolism: Secondary | ICD-10-CM

## 2021-06-23 DIAGNOSIS — J4540 Moderate persistent asthma, uncomplicated: Secondary | ICD-10-CM | POA: Diagnosis not present

## 2021-06-23 DIAGNOSIS — E7800 Pure hypercholesterolemia, unspecified: Secondary | ICD-10-CM | POA: Diagnosis not present

## 2021-06-23 DIAGNOSIS — R072 Precordial pain: Secondary | ICD-10-CM

## 2021-06-23 DIAGNOSIS — I10 Essential (primary) hypertension: Secondary | ICD-10-CM

## 2021-06-23 DIAGNOSIS — Z7901 Long term (current) use of anticoagulants: Secondary | ICD-10-CM | POA: Diagnosis not present

## 2021-06-23 DIAGNOSIS — G4731 Primary central sleep apnea: Secondary | ICD-10-CM

## 2021-06-23 DIAGNOSIS — E1140 Type 2 diabetes mellitus with diabetic neuropathy, unspecified: Secondary | ICD-10-CM

## 2021-06-23 DIAGNOSIS — I484 Atypical atrial flutter: Secondary | ICD-10-CM | POA: Diagnosis not present

## 2021-06-23 DIAGNOSIS — I5032 Chronic diastolic (congestive) heart failure: Secondary | ICD-10-CM | POA: Diagnosis not present

## 2021-06-23 DIAGNOSIS — E668 Other obesity: Secondary | ICD-10-CM | POA: Diagnosis not present

## 2021-06-23 MED ORDER — FUROSEMIDE 20 MG PO TABS: ORAL_TABLET | ORAL | 3 refills | Status: DC

## 2021-06-23 NOTE — Patient Instructions (Signed)
Medication Instructions:  TAKE Furosemide 20 mg as needed for a weight gain of 3 pounds in one day or 5 pounds in a week  *If you need a refill on your cardiac medications before your next appointment, please call your pharmacy*   Lab Work: None ordered If you have labs (blood work) drawn today and your tests are completely normal, you will receive your results only by: [place] (if you have MyChart) OR A paper copy in the mail If you have any lab test that is abnormal or we need to change your treatment, we will call you to review the results.   Testing/Procedures: None ordered   Follow-Up: At [facility], you and your health needs are our priority.  As part of our continuing mission to provide you with exceptional heart care, we have created designated Provider Care Teams.  These Care Teams include your primary Cardiologist (physician) and Advanced Practice Providers (APPs -  Physician Assistants and Nurse Practitioners) who all work together to provide you with the care you need, when you need it.  We recommend signing up for the patient portal called "MyChart".  Sign up information is provided on this After Visit Summary.  MyChart is used to connect with patients for Virtual Visits (Telemedicine).  Patients are able to view lab/test results, encounter notes, upcoming appointments, etc.  Non-urgent messages can be sent to your provider as well.   To learn more about what you can do with MyChart, go to [email or web address].    Your next appointment:   6 month(s)  The format for your next appointment:   In Person  Provider:   Sanda Klein, MD

## 2021-06-23 NOTE — Progress Notes (Signed)
Cardiology Office Note    Date:  06/24/2021   ID:  Lesli, Issa [birth year], MRN [id number]  PCP:  [name], MD  Cardiologist:   Sanda Klein, MD   Chief Complaint  Patient presents with   Irregular Heart Beat           History of Present Illness:  [name] is a 75 y.o. female with recent atrial flutter s/p DCCV 11/23/2020, paroxysmal atrial fibrillation, remote DVT/PE, moderate pulmonary hypertension, chronic reactive airway disease and degenerative joint disease returning for follow-up.  She is made healthy changes in her diet and has lost substantial weight (about 60 pounds in the last year).  She feels better.  She has more energy.  Her hemoglobin A1c is substantially improved, down to 5.7%.  She does not think she has had any palpitations or any arrhythmia (she has a wearable device with an optical monitor) since her cardioversion.  She has not had to take any furosemide for edema.  She is now only on irbesartan 150 mg for hypertension and continues to take atorvastatin 10 mg daily for hyperlipidemia.  She is compliant with Xarelto and has not had any falls or bleeding problems.  She gets exercise by walking in her house, her daughter does not like her weight walking outside in the neighborhood by herself.  She reports compliant with Xarelto and has not had any bleeding problems.  She has not had falls.  Previous coronary angiography showed no evidence of CAD but she did have moderate pulmonary artery hypertension by right heart catheterization (systolic PAP 50 mmHg).  She has had a variety of noncardiac issues including recurrent issues with diarrhea (she is seeing Dr. Cristina Gong) and continued hyperthyroidism controlled on methimazole (she is seeing Dr. Cruzita Lederer).    She continues to have issues with thoracic spine related pain (Dr. Gladstone Lighter). She has had a previous equivocal workup for hypercoagulable conditions (lupus anticoagulant positive on one of 2 separate  assays, protein S activity decreased with normal total protein S level).    Past Medical History:  Diagnosis Date   Allergic rhinitis    Allergy    Arthritis    Asthma    Chronic headache    Diabetes mellitus    DVT (deep venous thrombosis) (HCC)    Dyspnea    Heart murmur    Hypertension    Penicillin allergy 01/19/2020   Pulmonary embolism (HCC)    Pulmonary hypertension (HCC)    Seizures ([place])     Past Surgical History:  Procedure Laterality Date   ABDOMINAL HYSTERECTOMY     partial, has ovaries   CARDIAC CATHETERIZATION  12/28/2010   Mod. pulmonary hypertension, normal coronary arteries   CARDIOVERSION N/A 11/23/2020   Procedure: CARDIOVERSION;  Surgeon: Sanda Klein, MD;  Location: [facility];  Service: Cardiovascular;  Laterality: N/A;   DOPPLER ECHOCARDIOGRAPHY  10/08/2011   EF=>55%,mild asymmetric LVH, mod. TR, mod. PH, mild to mod LA dilatation   IR LUMBAR DISC ASPIRATION W/IMG GUIDE  01/12/2020   KNEE ARTHROSCOPY Left    KNEE SURGERY     Nuclear Stress Test  05/20/2006   No ischemia   PARTIAL HYSTERECTOMY     PLANTAR FASCIA SURGERY     TONSILLECTOMY      Current Medications: Outpatient Medications Prior to Visit  Medication Sig Dispense Refill   Acetaminophen-Codeine 300-30 MG tablet Take 1 tablet by mouth every 6 (six) hours as needed for pain. 30 tablet 0   albuterol (  VENTOLIN HFA) 108 (90 Base) MCG/ACT inhaler INHALE 2 PUFFS BY MOUTH EVERY 6 HOURS AS NEEDED FOR WHEEZING OR SHORTNESS OF BREATH 54 g 0   atorvastatin (LIPITOR) 10 MG tablet TAKE 1 TABLET(10 MG) BY MOUTH DAILY 90 tablet 3   Blood Glucose Monitoring Suppl (ONETOUCH VERIO) w/Device KIT Use to check blood sugar up to 2 times a day 1 kit 0   cyclobenzaprine (FLEXERIL) 10 MG tablet TAKE 1 TABLET(10 MG) BY MOUTH AT BEDTIME AS NEEDED FOR MUSCLE SPASMS 30 tablet 1   dextromethorphan-guaiFENesin (MUCINEX DM) 30-600 MG 12hr tablet Take 1 tablet by mouth 2 (two) times daily.     diclofenac Sodium  (VOLTAREN) 1 % GEL Apply 2 g topically 4 (four) times daily. 400 g 1   DM-APAP-CPM (CORICIDIN HBP PO) Take 1 tablet by mouth daily as needed (cough/cold).     famotidine (PEPCID) 40 MG tablet TAKE 1 TABLET(40 MG) BY MOUTH DAILY 30 tablet 2   fluticasone-salmeterol (ADVAIR HFA) 230-21 MCG/ACT inhaler Inhale 2 puffs into the lungs 2 (two) times daily. 12 g 5   gabapentin (NEURONTIN) 600 MG tablet TAKE 1 TABLET BY MOUTH EVERY DAY AT BREAKFAST, 1 TABLET AT LUNCH AND 2 TABLETS AT BEDTIME 360 tablet 1   GLYXAMBI 10-5 MG TABS TAKE 1 TABLET BY MOUTH DAILY 30 tablet 11   hydrOXYzine (ATARAX/VISTARIL) 10 MG tablet Take 1 tablet (10 mg total) by mouth daily as needed for itching. 30 tablet 2   irbesartan (AVAPRO) 150 MG tablet TAKE 1 TABLET(150 MG) BY MOUTH DAILY 30 tablet 8   Lactase (DAIRY-RELIEF PO) Take 1 capsule by mouth daily as needed (eating dairy).      Lancets (ONETOUCH DELICA PLUS [id number]) MISC USE TO CHECK BLOOD SUGAR UP TO TWICE DAILY 100 each 5   liver oil-zinc oxide (DESITIN) 40 % ointment Apply 1 application topically as needed for irritation.     montelukast (SINGULAIR) 10 MG tablet TAKE 1 TABLET(10 MG) BY MOUTH DAILY 90 tablet 1   nystatin (MYCOSTATIN) 100000 UNIT/ML suspension 20m swish and swallow after Advair 2 times daily. 300 mL 5   ONETOUCH VERIO test strip USE TO CHECK BLOOD SUGAR UP TO TWICE DAILY 100 strip 5   oxybutynin (DITROPAN-XL) 10 MG 24 hr tablet Take 10 mg by mouth daily.     pantoprazole (PROTONIX) 40 MG tablet TAKE 1 TABLET(40 MG) BY MOUTH DAILY 90 tablet 1   PATADAY 0.2 % SOLN Place 1 drop into both eyes daily as needed (allergies).   4   Polyethyl Glycol-Propyl Glycol (SYSTANE OP) Place 1 drop into both eyes in the morning and at bedtime.     Tiotropium Bromide Monohydrate (SPIRIVA RESPIMAT) 1.25 MCG/ACT AERS Inhale 2 puffs into the lungs daily. 4 g 5   tolterodine (DETROL LA) 4 MG 24 hr capsule Take 4 mg by mouth daily.     TRULANCE 3 MG TABS Take 3 mg by mouth  every other day.     XARELTO 20 MG TABS tablet TAKE 1 TABLET(20 MG) BY MOUTH DAILY WITH SUPPER 90 tablet 1   acetaminophen (TYLENOL) 650 MG CR tablet Take 1,300 mg by mouth daily as needed for pain. (Patient not taking: Reported on 06/23/2021)     EPINEPHrine 0.3 mg/0.3 mL IJ SOAJ injection USE AS DIRECTED FOR LIFE THREATENING ALLERGIC REACTIONS (Patient not taking: Reported on 06/23/2021) 2 each 2   fluticasone (FLOVENT HFA) 220 MCG/ACT inhaler INHALE 2 PUFFS BY MOUTH TWICE DAILY, DURING FLARE UP AS NEEDED. RINSE,  GARGLE AND SPIT AFTER USE (Patient not taking: Reported on 06/23/2021) 36 g 1   fluticasone (FLOVENT HFA) 220 MCG/ACT inhaler 2 puffs 2 times daily during Flare Up. (Patient not taking: Reported on 13/12/4381) 12 g 5   GARLIC PO Take 1 tablet by mouth daily. (Patient not taking: Reported on 06/23/2021)     Homeopathic Products [facility] RELIEF EX) Apply 1 spray topically daily as needed (muscle cramps). (Patient not taking: Reported on 06/23/2021)     ipratropium (ATROVENT) 0.06 % nasal spray USE 1 TO 2 SPRAYS IN EACH NOSTRIL 1 TO 2 TIMES PER DAY (Patient not taking: Reported on 06/23/2021) 15 mL 3   levalbuterol (XOPENEX) 1.25 MG/3ML nebulizer solution USE 1 VIAL VIA NEBULIZER EVERY 6 HOURS AS NEEDED FOR SHORTNESS OF BREATH OR WHEEZING (Patient not taking: Reported on 06/23/2021) 900 mL 1   meclizine (ANTIVERT) 12.5 MG tablet Take 1 tablet (12.5 mg total) by mouth 3 (three) times daily as needed for dizziness. (Patient not taking: Reported on 06/23/2021) 15 tablet 0   Menthol-Methyl Salicylate (SALONPAS JET SPRAY EX) Apply 1 spray topically daily as needed (knee pain). (Patient not taking: Reported on 06/23/2021)     methimazole (TAPAZOLE) 5 MG tablet Take 1 tablet (5 mg total) by mouth daily. (Patient not taking: Reported on 06/23/2021) 90 tablet 3   Misc Natural Products (NEURIVA PO) Take 2 tablets by mouth daily. (Patient not taking: Reported on 06/23/2021)     ondansetron (ZOFRAN-ODT) 4 MG  disintegrating tablet Take 1 tablet (4 mg total) by mouth every 8 (eight) hours as needed for nausea or vomiting. (Patient not taking: Reported on 06/23/2021) 20 tablet 0   potassium chloride SA (KLOR-CON) 20 MEQ tablet Take 1 tablet (20 mEq total) by mouth as needed (Take one daily when you need to take a Furosemide). (Patient not taking: Reported on 06/23/2021) 30 tablet 0   Turmeric 500 MG CAPS Take 1,000 mg by mouth daily. (Patient not taking: Reported on 06/23/2021)     furosemide (LASIX) 20 MG tablet Take 1 tablet (20 mg total) by mouth as needed for fluid (Take one daily as needed for swelling). (Patient not taking: Reported on 06/23/2021) 90 tablet 3   Facility-Administered Medications Prior to Visit  Medication Dose Route Frequency Provider Last Rate Last Admin   omalizumab Arvid Right) injection 300 mg  300 mg Subcutaneous Q28 days Kozlow, Donnamarie Poag, MD   300 mg at 06/20/21 1015     Allergies:   Latex, Penicillins, Oxycodone-acetaminophen, Lyrica [pregabalin], Nickel, Phenytoin sodium extended, and Vicodin [hydrocodone-acetaminophen]   Social History   Socioeconomic History   Marital status: Widowed    Spouse name: Not on file   Number of children: Y   Years of education: Not on file   Highest education level: Not on file  Occupational History   Occupation: retired Geologist, engineering.   Tobacco Use   Smoking status: Never   Smokeless tobacco: Never  Vaping Use   Vaping Use: Never used  Substance and Sexual Activity   Alcohol use: Not Currently    Alcohol/week: 0.0 standard drinks    Comment: occ glass on wine   Drug use: No   Sexual activity: Never  Other Topics Concern   Not on file  Social History Narrative   Widow    limited exercise.   Social Determinants of Health   Financial Resource Strain: Low Risk    Difficulty of Paying Living Expenses: Not very hard  Food Insecurity: Not on file  Transportation Needs: Not on file  Physical Activity: Not on file  Stress: Not on file   Social Connections: Not on file     Family History:  The patient's family history includes Alcohol abuse in her father; Allergies in an other family member; Arthritis in her mother and sister; Breast cancer in her mother; Cancer in her brother; Clotting disorder in her mother; Diabetes in her mother and sister; Hypertension in her mother; Multiple sclerosis in her sister; Stroke in her mother.   ROS:   Please see the history of present illness.    ROS All other systems reviewed and are negative.   PHYSICAL EXAM:   VS:  BP 122/64 (BP Location: Left Arm, Patient Position: Sitting, Cuff Size: Large)   Pulse 81   Ht '5\' 4"'  (1.626 m)   Wt 198 lb 6.4 oz (90 kg)   SpO2 98%   BMI 34.06 kg/m      General: Alert, oriented x3, no distress, appears well Head: no evidence of trauma, PERRL, EOMI, no exophtalmos or lid lag, no myxedema, no xanthelasma; normal ears, nose and oropharynx Neck: normal jugular venous pulsations and no hepatojugular reflux; brisk carotid pulses without delay and no carotid bruits Chest: clear to auscultation, no signs of consolidation by percussion or palpation, normal fremitus, symmetrical and full respiratory excursions Cardiovascular: normal position and quality of the apical impulse, regular rhythm, normal first and second heart sounds, no murmurs, rubs or gallops Abdomen: no tenderness or distention, no masses by palpation, no abnormal pulsatility or arterial bruits, normal bowel sounds, no hepatosplenomegaly Extremities: no clubbing, cyanosis or edema; 2+ radial, ulnar and brachial pulses bilaterally; 2+ right femoral, posterior tibial and dorsalis pedis pulses; 2+ left femoral, posterior tibial and dorsalis pedis pulses; no subclavian or femoral bruits Neurological: grossly nonfocal Psych: Normal mood and affect     Wt Readings from Last 3 Encounters:  06/23/21 198 lb 6.4 oz (90 kg)  06/06/21 202 lb (91.6 kg)  05/25/21 191 lb 2 oz (86.7 kg)       Studies/Labs Reviewed:  Echocardiogram 11/07/2020   1. Rhythm appears to be atrial flutter with variable conduction. Left  ventricular ejection fraction, by estimation, is 60 to 65%. The left  ventricle has normal function. The left ventricle has no regional wall  motion abnormalities. There is moderate  asymmetric left ventricular hypertrophy of the basal-septal segment. Left  ventricular diastolic parameters are indeterminate.   2. Right ventricular systolic function is normal. The right ventricular  size is normal. There is mildly elevated pulmonary artery systolic  pressure. The estimated right ventricular systolic pressure is 09.6 mmHg.   3. Left atrial size was mildly dilated.   4. Right atrial size was moderately dilated.   5. The mitral valve is normal in structure. Trivial mitral valve  regurgitation. No evidence of mitral stenosis.   6. Tricuspid valve regurgitation is moderate.   7. The aortic valve is tricuspid. Aortic valve regurgitation is not  visualized. No aortic stenosis is present.   8. The inferior vena cava is normal in size with greater than 50%  respiratory variability, suggesting right atrial pressure of 3 mmHg.   EKG:  EKG is not ordered today.  Her most recent tracing from 12/22/2020 was personally reviewed: Normal sinus rhythm, incomplete right bundle branch block, QTC 392 ms.  Recent Labs: 10/26/2020: BNP 83.6 12/30/2020: Hemoglobin 13.0; Platelets 250 04/27/2021: ALT 12; BUN 10; Creatinine, Ser 0.93; Potassium 4.3; Sodium 144 05/30/2021: TSH 0.13   Lipid  Panel    Component Value Date/Time   CHOL 120 04/27/2021 1239   TRIG 49.0 04/27/2021 1239   HDL 61.00 04/27/2021 1239   CHOLHDL 2 04/27/2021 1239   VLDL 9.8 04/27/2021 1239   LDLCALC 49 04/27/2021 1239     ASSESSMENT:    1. Atypical atrial flutter ([place])   2. Chronic diastolic heart failure ([place])   3. Essential hypertension   4. Precordial pain   5. History of pulmonary embolism   6.  Type 2 diabetes mellitus with diabetic neuropathy, without long-term current use of insulin ([place])   7. PAH (pulmonary artery hypertension) ([place])   8. Hypercholesterolemia   9. Moderate obesity   10. Long term current use of anticoagulant   11. Asthma, moderate persistent   12. Hyperthyroidism      PLAN:  In order of problems listed above:  AFlutter: She does not necessarily feel the palpitations, but she is now has a wearable device that has not reported any arrhythmia.  Unfortunately it is not a ECG tracer, but it should still alert [place] for irregular or at least for rapid rhythms.  She had definite improvement in her stamina and energy level in normal rhythm compared to her atrial flutter, after the cardioversion.  CHADSVasc 4 (age, gender, HTN, DM, CHF).  Discussed options for future treatment with either antiarrhythmics or cavotricuspid isthmus ablation.  She does have a remote history of atrial fibrillation so CVI ablation would have limited benefit and would not allow discontinuation of anticoagulants. CHF: This occurred only during her episode of persistent atrial flutter.  She has preserved left ventricular ejection fraction.  No longer taking diuretics on a regular basis, only as needed (and she has not needed them in a long time).  She continues to lose weight, it may be difficult to establish her dry weight for a while.  Discussed the importance of daily weight monitoring and adjustment based on rapid changes in weight (more than 3 pounds in 24 hours or 5 pounds in a week). HTN: Controlled.  She might have normal blood pressure even without the irbesartan, but this probably offer some benefit for renal function protection. Chest pain: This resolved with resolution of her arrhythmia.   She had a low risk nuclear stress test in 8101 Hx DVT/PE: Conflicting results on previous hypercoagulable work-up, but this is probably a moot point since she will require chronic lifelong anticoagulation for  atrial arrhythmia anyway. PAH: Probably multifactorial related to left heart diastolic dysfunction, previous pulm embolism, longstanding obesity.  Mild, with estimated systolic PA pressure of 75ZWCH on the most recent echocardiogram, similar to 2016 when it was 46 mmHg.  The recent episode of heart failure exacerbation suggest that it is probably at least partly related to left ventricular diastolic dysfunction, but it may also be related to longstanding obesity and previous pulmonary embolism. DM: Excellent control.  Most recent hemoglobin A1c was in fully normal range at 5.7% on SGLT2 inhibitor. HLP: All lipid parameters are in the desirable range on atorvastatin. Obesity: She is planning to continue her efforts to lose more weight. Complex sleep apnea:  on CPAP (via [facility]). Xarelto: Indicated both for atrial fibrillation and history of venous thromboembolism.  Denies falls or bleeding complications. Asthma: Avoid carvedilol or other nonselective beta-blockers.  It is quite possible that her most recent episode of "asthma exacerbation" may have been due to heart failure. On Xolair, Spiriva, albuterol/levalbuterol, montelukast, inhaled steroids/LABA (Advair). Dr. Neldon Mc. Hyperthyroidism: Controlled on chronic methimazole.  Most recent TSH was mildly suppressed at 0.130.  Has an appointment with her endocrinologist in a few months.   Medication Adjustments/Labs and Tests Ordered: Current medicines are reviewed at length with the patient today.  Concerns regarding medicines are outlined above.  Medication changes, Labs and Tests ordered today are listed in the Patient Instructions below: Patient Instructions  Medication Instructions:  TAKE Furosemide 20 mg as needed for a weight gain of 3 pounds in one day or 5 pounds in a week  *If you need a refill on your cardiac medications before your next appointment, please call your pharmacy*   Lab Work: None ordered If you have labs (blood  work) drawn today and your tests are completely normal, you will receive your results only by: [place] (if you have MyChart) OR A paper copy in the mail If you have any lab test that is abnormal or we need to change your treatment, we will call you to review the results.   Testing/Procedures: None ordered   Follow-Up: At [facility], you and your health needs are our priority.  As part of our continuing mission to provide you with exceptional heart care, we have created designated Provider Care Teams.  These Care Teams include your primary Cardiologist (physician) and Advanced Practice Providers (APPs -  Physician Assistants and Nurse Practitioners) who all work together to provide you with the care you need, when you need it.  We recommend signing up for the patient portal called "MyChart".  Sign up information is provided on this After Visit Summary.  MyChart is used to connect with patients for Virtual Visits (Telemedicine).  Patients are able to view lab/test results, encounter notes, upcoming appointments, etc.  Non-urgent messages can be sent to your provider as well.   To learn more about what you can do with MyChart, go to [email or web address].    Your next appointment:   6 month(s)  The format for your next appointment:   In Person  Provider:   Sanda Klein, MD      Signed, Sanda Klein, MD  06/24/2021 5:33 PM    [address], [place], [place]  [address] Phone: [phone]; Fax: [phone]

## 2021-06-24 ENCOUNTER — Encounter: Payer: Self-pay | Admitting: Cardiovascular Disease

## 2021-06-24 DIAGNOSIS — I484 Atypical atrial flutter: Secondary | ICD-10-CM | POA: Insufficient documentation

## 2021-06-24 DIAGNOSIS — I5032 Chronic diastolic (congestive) heart failure: Secondary | ICD-10-CM | POA: Insufficient documentation

## 2021-06-27 ENCOUNTER — Other Ambulatory Visit (INDEPENDENT_AMBULATORY_CARE_PROVIDER_SITE_OTHER): Payer: Medicare Other

## 2021-06-27 ENCOUNTER — Other Ambulatory Visit: Payer: Self-pay

## 2021-06-27 DIAGNOSIS — E0500 Thyrotoxicosis with diffuse goiter without thyrotoxic crisis or storm: Secondary | ICD-10-CM

## 2021-06-27 LAB — TSH: TSH: 0.71 u[IU]/mL (ref 0.35–5.50)

## 2021-06-27 LAB — T3, FREE: T3, Free: 3.3 pg/mL (ref 2.3–4.2)

## 2021-06-27 LAB — T4, FREE: Free T4: 0.99 ng/dL (ref 0.60–1.60)

## 2021-07-03 ENCOUNTER — Other Ambulatory Visit: Payer: Self-pay | Admitting: Allergy and Immunology

## 2021-07-03 ENCOUNTER — Telehealth: Payer: Self-pay

## 2021-07-03 ENCOUNTER — Telehealth: Payer: Self-pay | Admitting: Cardiovascular Disease

## 2021-07-03 NOTE — Telephone Encounter (Signed)
Left a message for the patient to call back.  

## 2021-07-03 NOTE — Telephone Encounter (Signed)
Patient called because she wants the nurses to put what each of her medications are for on the label. I did inform the patient that we do for Dr. Lyndon Code medication write them out as it appears on the AVS. I also recommended that she could make labels for the top of the bottles that way she can keep track of all her medication. I also asked which medications she had question or concerns about so I could go over it with her based on her previous AVS. She stated she understood the use of all her medication and did not need to go over use or reasoning of any of her medications. She was not pleased with my advise and plans to call dr. Neldon Mc in [place].

## 2021-07-03 NOTE — Telephone Encounter (Signed)
° °  Pt c/o medication issue:  1. Name of Medication: all her heart meds   2. How are you currently taking this medication (dosage and times per day)?   3. Are you having a reaction (difficulty breathing--STAT)?   4. What is your medication issue? Pt is asking if Dr. Loletha Grayer can put a short description of her meds what it is for, she said she is taking so much meds and she doesn't know and it doesn't indicate on her meds bottle what it is for

## 2021-07-03 NOTE — Telephone Encounter (Signed)
Patient called the office and is requesting to speak with Dr. Neldon Mc regarding her medications. She states she is taking too many and it is hard for her to keep up with them. She is wanting the prescriptions to include what she is using them for on the label.

## 2021-07-05 NOTE — Telephone Encounter (Signed)
[id number] 

## 2021-07-05 NOTE — Telephone Encounter (Signed)
spoke to patient. She would like for office to call  pharmacy - and put the reason why she is taking the medication  ie  atorvastatin  - cholesterol    RN asked patient if we could  print  a updated medication  with the information and mailed to her. She states that would okay.  Rn mailed list  with why she is taking  medication from Dr Sallyanne Kuster.  Atorvastatin  10 mg Xarelto 20 mg Furosemide 20 mg  Potassium 20 meq Irbesartan 150 mg

## 2021-07-12 ENCOUNTER — Telehealth: Payer: Self-pay | Admitting: Family Medicine

## 2021-07-12 NOTE — Telephone Encounter (Signed)
Pt called she wanted to know where Dr.Bedsole was going to refer her to for her back pain

## 2021-07-12 NOTE — Telephone Encounter (Signed)
Spoke with Ms. Basic.  She was asking if Dr. Diona Browner ever referred her to Dr. Hayden Pedro that she spoke to Dr. Diona Browner about back in the Summer.  I see a referral was placed back on 12/30/2020 but patient said no one ever contacted her to schedule.  I will forward message for Varney Daily to follow up on.

## 2021-07-12 NOTE — Telephone Encounter (Signed)
Please contact patient. I am not sure what referral she is talking about. I see no recent telephone notes or office visits discussing a referral.  Let me know what her concern is.

## 2021-07-17 DIAGNOSIS — J4550 Severe persistent asthma, uncomplicated: Secondary | ICD-10-CM | POA: Diagnosis not present

## 2021-07-18 ENCOUNTER — Ambulatory Visit (INDEPENDENT_AMBULATORY_CARE_PROVIDER_SITE_OTHER): Payer: Medicare Other | Admitting: *Deleted

## 2021-07-18 ENCOUNTER — Other Ambulatory Visit: Payer: Self-pay

## 2021-07-18 DIAGNOSIS — J4550 Severe persistent asthma, uncomplicated: Secondary | ICD-10-CM

## 2021-07-19 ENCOUNTER — Other Ambulatory Visit: Payer: Self-pay | Admitting: Allergy and Immunology

## 2021-07-20 ENCOUNTER — Telehealth: Payer: Self-pay | Admitting: Cardiovascular Disease

## 2021-07-20 NOTE — Telephone Encounter (Signed)
Spoke to patient stated she received medication list in mail.She had requested we write down beside each medicine what each medication was for.Medications were reviewed.She voiced understanding.All questions answered.Advised to call back if she has any more questions.

## 2021-07-20 NOTE — Telephone Encounter (Signed)
Patient called stating Ivin Booty had sent her some paperwork, she would like to speak to her about them.

## 2021-07-24 ENCOUNTER — Other Ambulatory Visit: Payer: Self-pay | Admitting: Allergy and Immunology

## 2021-07-25 ENCOUNTER — Encounter: Payer: Self-pay | Admitting: Podiatry

## 2021-07-25 ENCOUNTER — Ambulatory Visit (INDEPENDENT_AMBULATORY_CARE_PROVIDER_SITE_OTHER): Payer: Medicare Other | Admitting: Podiatry

## 2021-07-25 ENCOUNTER — Other Ambulatory Visit: Payer: Self-pay

## 2021-07-25 DIAGNOSIS — B351 Tinea unguium: Secondary | ICD-10-CM

## 2021-07-25 DIAGNOSIS — L84 Corns and callosities: Secondary | ICD-10-CM

## 2021-07-25 DIAGNOSIS — Q828 Other specified congenital malformations of skin: Secondary | ICD-10-CM | POA: Diagnosis not present

## 2021-07-25 DIAGNOSIS — M79676 Pain in unspecified toe(s): Secondary | ICD-10-CM | POA: Diagnosis not present

## 2021-07-25 DIAGNOSIS — E1142 Type 2 diabetes mellitus with diabetic polyneuropathy: Secondary | ICD-10-CM

## 2021-07-30 NOTE — Progress Notes (Signed)
Subjective: [name] is a 76 y.o. female patient seen today for follow up of painful porokeratotic lesion(s) bilaterally and painful mycotic toenails that limit ambulation. Painful toenails interfere with ambulation. Aggravating factors include wearing enclosed shoe gear. Pain is relieved with periodic professional debridement. Painful porokeratotic lesions are aggravated when weightbearing with and without shoegear. Pain is relieved with periodic professional debridement..   New problems reported today: Patient relates concern of left 2nd digit with thick toenail with dried heme noted underneath toenail. She denies any preceding episodes of trauma.  Patient does not routinely monitor his/her blood glucose.  PCP is [name], MD. Last visit was: 05/25/2021.  Allergies  Allergen Reactions   Latex Hives   Penicillins Swelling    Has patient had a PCN reaction causing immediate rash, facial/tongue/throat swelling, SOB or lightheadedness with hypotension patient had a PCN reaction causing severe rash involving mucus membranes or skin necrosis: [id number] Has patient had a PCN reaction that required hospitalization/No Has patient had a PCN reaction occurring within the last 10 years: No If all of the above answers are "NO", then may proceed with Cephalosporin use.      Oxycodone-Acetaminophen Nausea And Vomiting   Lyrica [Pregabalin] Other (See Comments)    Blurred vision.   Nickel Rash   Phenytoin Sodium Extended Swelling   Vicodin [Hydrocodone-Acetaminophen] Nausea And Vomiting    Objective: Physical Exam  General: Patient is a pleasant 76 y.o. African American female WD, WN in NAD. AAO x 3.   Neurovascular Examination: CFT <3 seconds b/l LE. Palpable DP/PT pulses b/l LE. Digital hair sparse b/l. Skin temperature gradient WNL b/l. No pain with calf compression b/l. No edema noted b/l. No cyanosis or clubbing noted b/l LE.  Pt has subjective symptoms of neuropathy.  Protective sensation decreased with 10 gram monofilament b/l.  Dermatological:  Pedal integument with normal turgor, texture and tone BLE. No open wounds b/l LE. No interdigital macerations noted b/l LE. Toenails 2-5 bilaterally elongated, discolored, dystrophic, thickened, and crumbly with subungual debris and tenderness to dorsal palpation. Anonychia noted bilateral great toes. Nailbed(s) epithelialized.  Porokeratotic lesion(s) R hallux, submet head 5 b/l, and sub 5th met base b/l lower extremities. No erythema, no edema, no drainage, no fluctuance. Preulcerative lesion noted L 2nd toe. There is visible subdermal hemorrhage. There is no surrounding erythema, no edema, no drainage, no odor, no fluctuance.  Musculoskeletal:  Normal muscle strength 5/5 to all lower extremity muscle groups bilaterally. Hammertoe deformity noted 2-5 b/l.Marland Kitchen No pain, crepitus or joint limitation noted with ROM b/l LE.  Patient ambulates independently without assistive aids.  Assessment: 1. Pain due to onychomycosis of toenail   2. Porokeratosis   3. Pre-ulcerative corn or callous   4. Diabetic polyneuropathy associated with type 2 diabetes mellitus ([place])    Plan: Patient was evaluated and treated and all questions answered. Consent given for treatment as described below: -Will schedule patient with Dr. Milinda Pointer to assess for possible tenotomy of left 2nd toe due to development of preulcerative lesion at distal tip. -Continue foot and shoe inspections daily. Monitor blood glucose per PCP/Endocrinologist's recommendations. -Mycotic toenails 2-5 bilaterally were debrided in length and girth with sterile nail nippers and dremel without iatrogenic bleeding. -Preulcerative lesion pared L 2nd toe. Total number pared=1. -Painful porokeratotic lesion(s) R hallux, submet head 5 b/l, and sub 5th met base b/l lower extremities pared and enucleated with sterile scalpel blade without incident. Total number of lesions  debrided=5. -Dispensed toe crest for left  foot to alleviate pressure at distal tip of left 2nd toe. Apply to L 3rd toe every morning. Remove every evening. -Patient/POA to call should there be question/concern in the interim.  Return in about 3 months (around 10/23/2021).  Marzetta Board, DPM

## 2021-08-01 ENCOUNTER — Ambulatory Visit (INDEPENDENT_AMBULATORY_CARE_PROVIDER_SITE_OTHER): Payer: Medicare Other | Admitting: Podiatry

## 2021-08-01 ENCOUNTER — Telehealth: Payer: Self-pay | Admitting: Podiatry

## 2021-08-01 ENCOUNTER — Encounter: Payer: Self-pay | Admitting: Podiatry

## 2021-08-01 ENCOUNTER — Ambulatory Visit (INDEPENDENT_AMBULATORY_CARE_PROVIDER_SITE_OTHER): Payer: Medicare Other | Admitting: Family Medicine

## 2021-08-01 ENCOUNTER — Other Ambulatory Visit: Payer: Self-pay

## 2021-08-01 ENCOUNTER — Encounter: Payer: Self-pay | Admitting: Family Medicine

## 2021-08-01 VITALS — BP 120/70 | HR 64 | Temp 98.1°F | Ht 64.0 in | Wt 197.4 lb

## 2021-08-01 DIAGNOSIS — M62838 Other muscle spasm: Secondary | ICD-10-CM

## 2021-08-01 DIAGNOSIS — M545 Low back pain, unspecified: Secondary | ICD-10-CM | POA: Diagnosis not present

## 2021-08-01 DIAGNOSIS — M205X2 Other deformities of toe(s) (acquired), left foot: Secondary | ICD-10-CM | POA: Diagnosis not present

## 2021-08-01 DIAGNOSIS — G8929 Other chronic pain: Secondary | ICD-10-CM | POA: Diagnosis not present

## 2021-08-01 DIAGNOSIS — M205X9 Other deformities of toe(s) (acquired), unspecified foot: Secondary | ICD-10-CM

## 2021-08-01 MED ORDER — ACETAMINOPHEN-CODEINE 300-30 MG PO TABS: 1.0000 | ORAL_TABLET | Freq: Four times a day (QID) | ORAL | 0 refills | Status: DC | PRN

## 2021-08-01 MED ORDER — CYCLOBENZAPRINE HCL 10 MG PO TABS: ORAL_TABLET | ORAL | 1 refills | Status: DC

## 2021-08-01 NOTE — Progress Notes (Signed)
Patient ID: [name], female    DOB: [birth year], 76 y.o.   MRN: [id number]  This visit was conducted in person.  BP 120/70    Pulse 64    Temp 98.1 F (36.7 C) (Temporal)    Ht '5\' 4"'  (1.626 m)    Wt 197 lb 6 oz (89.5 kg)    SpO2 96%    BMI 33.88 kg/m    CC:  Chief Complaint  Patient presents with   Back Pain     Subjective:   HPI: [name] is a 76 y.o. female  with chronic low back pain,  lumbo sacral discitis treat with PICC line antibiotic  in 2021 presenting on 08/01/2021 for Back Pain   Dr. Gladstone Lighter is her orthopedic surgeon  Referred in 12/2020 to Dr. Hayden Pedro at [facility] for second opinion.. she never saw them.   Pain in low back as chromically but now shoot ing up to upper back.  No fall, no known injury.  No radiation to legs, no numbness, no weakness. PDMP reviewed during this encounter.   No fever.   Tylenol with codeine helps some with pain, she is out.  In past tried cyclobenzaprine for spasm.. she is out.  She is trying to walk more inside the house.  Relevant past medical, surgical, family and social history reviewed and updated as indicated. Interim medical history since our last visit reviewed. Allergies and medications reviewed and updated. Outpatient Medications Prior to Visit  Medication Sig Dispense Refill   acetaminophen (TYLENOL) 650 MG CR tablet Take 1,300 mg by mouth daily as needed for pain.     Acetaminophen-Codeine 300-30 MG tablet Take 1 tablet by mouth every 6 (six) hours as needed for pain. 30 tablet 0   ADVAIR HFA 230-21 MCG/ACT inhaler INHALE 2 PUFFS BY MOUTH TWICE DAILY TO PREVENT COUGH OR WHEEZING. RINSE MOUTH AFTER USE 12 g 5   albuterol (VENTOLIN HFA) 108 (90 Base) MCG/ACT inhaler INHALE 2 PUFFS BY MOUTH EVERY 6 HOURS AS NEEDED FOR WHEEZING OR SHORTNESS OF BREATH 54 g 0   atorvastatin (LIPITOR) 10 MG tablet TAKE 1 TABLET(10 MG) BY MOUTH DAILY 90 tablet 3   Blood Glucose Monitoring Suppl (ONETOUCH VERIO) w/Device KIT Use to  check blood sugar up to 2 times a day 1 kit 0   cyclobenzaprine (FLEXERIL) 10 MG tablet TAKE 1 TABLET(10 MG) BY MOUTH AT BEDTIME AS NEEDED FOR MUSCLE SPASMS 30 tablet 1   dextromethorphan-guaiFENesin (MUCINEX DM) 30-600 MG 12hr tablet Take 1 tablet by mouth 2 (two) times daily.     diclofenac Sodium (VOLTAREN) 1 % GEL Apply 2 g topically 4 (four) times daily. 400 g 1   EPINEPHrine 0.3 mg/0.3 mL IJ SOAJ injection USE AS DIRECTED FOR LIFE THREATENING ALLERGIC REACTIONS 2 each 2   famotidine (PEPCID) 40 MG tablet TAKE 1 TABLET(40 MG) BY MOUTH DAILY 30 tablet 5   fluticasone (FLOVENT HFA) 220 MCG/ACT inhaler 2 puffs 2 times daily during Flare Up. 12 g 5   furosemide (LASIX) 20 MG tablet Take 20 mg as needed for a weight gain of 3 pounds overnight or 5 pounds in one week,. 30 tablet 3   gabapentin (NEURONTIN) 600 MG tablet TAKE 1 TABLET BY MOUTH EVERY DAY AT BREAKFAST, 1 TABLET AT LUNCH AND 2 TABLETS AT BEDTIME 360 tablet 1   GLYXAMBI 10-5 MG TABS TAKE 1 TABLET BY MOUTH DAILY 30 tablet 11   Homeopathic Products (THERAWORX RELIEF EX) Apply 1 spray  topically daily as needed (muscle cramps).     hydrOXYzine (ATARAX/VISTARIL) 10 MG tablet Take 1 tablet (10 mg total) by mouth daily as needed for itching. 30 tablet 2   ipratropium (ATROVENT) 0.06 % nasal spray USE 1 TO 2 SPRAYS IN EACH NOSTRIL 1 TO 2 TIMES PER DAY 15 mL 3   irbesartan (AVAPRO) 150 MG tablet TAKE 1 TABLET(150 MG) BY MOUTH DAILY 30 tablet 8   Lactase (DAIRY-RELIEF PO) Take 1 capsule by mouth daily as needed (eating dairy).      Lancets (ONETOUCH DELICA PLUS [id number]) MISC USE TO CHECK BLOOD SUGAR UP TO TWICE DAILY 100 each 5   levalbuterol (XOPENEX) 1.25 MG/3ML nebulizer solution USE 1 VIAL VIA NEBULIZER EVERY 6 HOURS AS NEEDED FOR SHORTNESS OF BREATH OR WHEEZING 900 mL 1   liver oil-zinc oxide (DESITIN) 40 % ointment Apply 1 application topically as needed for irritation.     meclizine (ANTIVERT) 12.5 MG tablet Take 1 tablet (12.5 mg total)  by mouth 3 (three) times daily as needed for dizziness. 15 tablet 0   Menthol-Methyl Salicylate (SALONPAS JET SPRAY EX) Apply 1 spray topically daily as needed (knee pain).     methimazole (TAPAZOLE) 5 MG tablet Take 1 tablet (5 mg total) by mouth daily. 90 tablet 3   montelukast (SINGULAIR) 10 MG tablet Take 1 tablet (10 mg total) by mouth at bedtime. For asthma control. 90 tablet 1   Nutritional Supplements (NUTRITIONAL SUPPLEMENT PO) Take by mouth. Omega XL - Take 2 gel capsules 2 times a day     nystatin (MYCOSTATIN) 100000 UNIT/ML suspension 75m swish and swallow after Advair 2 times daily. 300 mL 5   ondansetron (ZOFRAN-ODT) 4 MG disintegrating tablet Take 1 tablet (4 mg total) by mouth every 8 (eight) hours as needed for nausea or vomiting. 20 tablet 0   ONETOUCH VERIO test strip USE TO CHECK BLOOD SUGAR UP TO TWICE DAILY 100 strip 5   oxybutynin (DITROPAN-XL) 10 MG 24 hr tablet Take 10 mg by mouth daily.     pantoprazole (PROTONIX) 40 MG tablet TAKE 1 TABLET(40 MG) BY MOUTH DAILY 90 tablet 1   PATADAY 0.2 % SOLN Place 1 drop into both eyes daily as needed (allergies).   4   Polyethyl Glycol-Propyl Glycol (SYSTANE OP) Place 1 drop into both eyes in the morning and at bedtime.     potassium chloride SA (KLOR-CON) 20 MEQ tablet Take 1 tablet (20 mEq total) by mouth as needed (Take one daily when you need to take a Furosemide). 30 tablet 0   SPIRIVA RESPIMAT 1.25 MCG/ACT AERS INHALE 2 PUFFS INTO THE LUNGS DAILY 4 g 5   tolterodine (DETROL LA) 4 MG 24 hr capsule Take 4 mg by mouth daily.     TRULANCE 3 MG TABS Take 3 mg by mouth every other day.     XARELTO 20 MG TABS tablet TAKE 1 TABLET(20 MG) BY MOUTH DAILY WITH SUPPER 90 tablet 1   ADVAIR HFA 230-21 MCG/ACT inhaler INHALE 2 PUFFS BY MOUTH TWICE DAILY TO PREVENT COUGH OR WHEEZING. RINSE MOUTH AFTER USE 12 g 5   DM-APAP-CPM (CORICIDIN HBP PO) Take 1 tablet by mouth daily as needed (cough/cold).     fluticasone (FLOVENT HFA) 220 MCG/ACT  inhaler INHALE 2 PUFFS BY MOUTH TWICE DAILY, DURING FLARE UP AS NEEDED. RINSE, GARGLE AND SPIT AFTER USE (Patient not taking: Reported on 137/02/5884 36 g 1   GARLIC PO Take 1 tablet by mouth daily. (Patient  not taking: Reported on 06/23/2021)     Misc Natural Products (NEURIVA PO) Take 2 tablets by mouth daily. (Patient not taking: Reported on 06/23/2021)     SPIRIVA RESPIMAT 1.25 MCG/ACT AERS INHALE 2 PUFFS INTO THE LUNGS DAILY 4 g 5   Turmeric 500 MG CAPS Take 1,000 mg by mouth daily. (Patient not taking: Reported on 06/23/2021)     Facility-Administered Medications Prior to Visit  Medication Dose Route Frequency Provider Last Rate Last Admin   omalizumab Arvid Right) injection 300 mg  300 mg Subcutaneous Q28 days Kozlow, Donnamarie Poag, MD   300 mg at 07/18/21 1122     Per HPI unless specifically indicated in ROS section below Review of Systems  Constitutional:  Negative for fatigue and fever.  HENT:  Negative for congestion.   Eyes:  Negative for pain.  Respiratory:  Negative for cough and shortness of breath.   Cardiovascular:  Negative for chest pain, palpitations and leg swelling.  Gastrointestinal:  Negative for abdominal pain.  Genitourinary:  Negative for dysuria and vaginal bleeding.  Musculoskeletal:  Positive for back pain.  Neurological:  Negative for syncope, light-headedness and headaches.  Psychiatric/Behavioral:  Negative for dysphoric mood.   Objective:  BP 120/70    Pulse 64    Temp 98.1 F (36.7 C) (Temporal)    Ht '5\' 4"'  (1.626 m)    Wt 197 lb 6 oz (89.5 kg)    SpO2 96%    BMI 33.88 kg/m   Wt Readings from Last 3 Encounters:  08/01/21 197 lb 6 oz (89.5 kg)  06/23/21 198 lb 6.4 oz (90 kg)  06/06/21 202 lb (91.6 kg)      Physical Exam Constitutional:      General: She is not in acute distress.    Appearance: Normal appearance. She is well-developed. She is not ill-appearing or toxic-appearing.  HENT:     Head: Normocephalic.     Right Ear: Hearing, tympanic membrane, ear  canal and external ear normal. Tympanic membrane is not erythematous, retracted or bulging.     Left Ear: Hearing, tympanic membrane, ear canal and external ear normal. Tympanic membrane is not erythematous, retracted or bulging.     Nose: No mucosal edema or rhinorrhea.     Right Sinus: No maxillary sinus tenderness or frontal sinus tenderness.     Left Sinus: No maxillary sinus tenderness or frontal sinus tenderness.     Mouth/Throat:     Pharynx: Uvula midline.  Eyes:     General: Lids are normal. Lids are everted, no foreign bodies appreciated.     Conjunctiva/sclera: Conjunctivae normal.     Pupils: Pupils are equal, round, and reactive to light.  Neck:     Thyroid: No thyroid mass or thyromegaly.     Vascular: No carotid bruit.     Trachea: Trachea normal.  Cardiovascular:     Rate and Rhythm: Normal rate and regular rhythm.     Pulses: Normal pulses.     Heart sounds: Normal heart sounds, S1 normal and S2 normal. No murmur heard.   No friction rub. No gallop.  Pulmonary:     Effort: Pulmonary effort is normal. No tachypnea or respiratory distress.     Breath sounds: Normal breath sounds. No decreased breath sounds, wheezing, rhonchi or rales.  Abdominal:     General: Bowel sounds are normal.     Palpations: Abdomen is soft.     Tenderness: There is no abdominal tenderness.  Musculoskeletal:  Cervical back: Normal range of motion and neck supple. Tenderness present. No bony tenderness.     Lumbar back: Tenderness present. No bony tenderness. Decreased range of motion. Negative right straight leg raise test and negative left straight leg raise test.  Skin:    General: Skin is warm and dry.     Findings: No rash.  Neurological:     Mental Status: She is alert.  Psychiatric:        Mood and Affect: Mood is not anxious or depressed.        Speech: Speech normal.        Behavior: Behavior normal. Behavior is cooperative.        Thought Content: Thought content normal.         Judgment: Judgment normal.      Results for orders placed or performed in visit on 06/27/21  T3, free  Result Value Ref Range   T3, Free 3.3 2.3 - 4.2 pg/mL  T4, free  Result Value Ref Range   Free T4 0.99 0.60 - 1.60 ng/dL  TSH  Result Value Ref Range   TSH 0.71 0.35 - 5.50 uIU/mL   *Note: Due to a large number of results and/or encounters for the requested time period, some results have not been displayed. A complete set of results can be found in Results Review.    This visit occurred during the SARS-CoV-2 public health emergency.  Safety protocols were in place, including screening questions prior to the visit, additional usage of staff PPE, and extensive cleaning of exam room while observing appropriate contact time as indicated for disinfecting solutions.   COVID 19 screen:  No recent travel or known exposure to COVID19 The patient denies respiratory symptoms of COVID 19 at this time. The importance of social distancing was discussed today.   Assessment and Plan    Problem List Items Addressed This Visit     Chronic low back pain without sciatica    Now with some new upper back pain.   Start working on upper and lower back pain home physical therapy.  Can use muscle relaxant as needed for muscle spasm.  Can use tylenol with codeine for pain on a limited basis.  Stop both of these if signs of over sedation present.  Call Dr. Hayden Pedro office  to check on status of referral.      Relevant Medications   Acetaminophen-Codeine 300-30 MG tablet   cyclobenzaprine (FLEXERIL) 10 MG tablet   Encounter for chronic pain management - Primary    Indication for chronic opioid:  Chronic lower back pain Medication and dose: tyenol with codeine # pills per month: 30 Last UDS date: not needed Opioid Treatment Agreement signed (Y/N): Y Opioid Treatment Agreement last reviewed with patient:   Y NCCSRS reviewed this encounter (include red flags): Yes  PDMP reviewed during this  encounter.         Other Visit Diagnoses     Muscle spasms of both lower extremities       Relevant Medications   cyclobenzaprine (FLEXERIL) 10 MG tablet        [name], MD

## 2021-08-01 NOTE — Progress Notes (Signed)
She presents today having been referred from Dr. Adah Perl to me for flexible hammertoe deformity this resulting in a distal clavus and superficial ulceration to the second toe left foot.  She states that ever since Dr. Adah Perl trimmed it and feels much better.  Objective: Vital signs are stable she is alert and oriented x3 there is no erythema edema cellulitis drainage or odor.  She has flexible hammertoe deformity second left with a slight distal clavus.  But it is relatively flexible and there is some extension limitation near full extension.  Assessment: Hammertoe deformity flexible.  Plan: At this point I performed a block to the toe consisting 50-50 mixture Marcaine plain lidocaine with epinephrine.  Areas prepped and draped in his normal sterile fashion.  Small 11 blade was utilized to transect the plantar tendon there is capsularis normal sterile saline and then Dermabond was placed.  She tolerated procedure well without complications I will follow-up with her in 1 to 2 weeks dry sterile dressing was applied as well as a Darco shoe dispensed

## 2021-08-01 NOTE — Telephone Encounter (Signed)
Lvm to schedule procedure f/u next week. If patient calls back, please schedule for some time next week with Dr. Milinda Pointer.

## 2021-08-01 NOTE — Assessment & Plan Note (Signed)
Now with some new upper back pain.   Start working on upper and lower back pain home physical therapy.  Can use muscle relaxant as needed for muscle spasm.  Can use tylenol with codeine for pain on a limited basis.  Stop both of these if signs of over sedation present.  Call Dr. Hayden Pedro office  to check on status of referral.

## 2021-08-01 NOTE — Assessment & Plan Note (Signed)
Indication for chronic opioid:  Chronic lower back pain Medication and dose: tyenol with codeine # pills per month: 30 Last UDS date: not needed Opioid Treatment Agreement signed (Y/N): Y Opioid Treatment Agreement last reviewed with patient:   Y NCCSRS reviewed this encounter (include red flags): Yes  PDMP reviewed during this encounter.

## 2021-08-01 NOTE — Patient Instructions (Signed)
Start working on upper and lower back pain home physical therapy.  Can use muscle relaxant as needed for muscle spasm.  Can use tylenol with codeine for pain on a limited basis.  Stop both of these if signs of over sedation present.  Call Dr. Hayden Pedro office  to check on status of referral.

## 2021-08-01 NOTE — Telephone Encounter (Signed)
I called the office of [name] -- they cannot locate the referral faxed back in June 2022  Verified best fax number [phone] This has been faxed Faxed to [phone]    [email or web address]  Virl Cagey, CMA on 08/01/2021 1534 - delivered at 08/01/2021 1534   The Patient can call to schedule her appt Dr Beatris Ship  --  [phone]

## 2021-08-01 NOTE — Patient Instructions (Signed)
Leave bandage in place and dry for 4 days, then remove. You may wash foot normally after removal of bandage. DO NOT SOAK FOOT! Dry completely afterwards and may use a bandaid over incision if needed. We will follow up with you in 1 weeks for recheck.  

## 2021-08-01 NOTE — Telephone Encounter (Addendum)
Dr. Waldon Merl office phone number provided to patient.  She will contact their office to schedule.

## 2021-08-02 ENCOUNTER — Telehealth: Payer: Self-pay | Admitting: Family Medicine

## 2021-08-02 NOTE — Telephone Encounter (Signed)
Pt called back she only wanted to talk to [name] about  referral for her back. Wouldn't give any other info

## 2021-08-03 ENCOUNTER — Other Ambulatory Visit: Payer: Self-pay | Admitting: Family Medicine

## 2021-08-03 ENCOUNTER — Ambulatory Visit: Payer: Medicare Other | Admitting: Family Medicine

## 2021-08-03 DIAGNOSIS — M545 Low back pain, unspecified: Secondary | ICD-10-CM

## 2021-08-03 DIAGNOSIS — G8929 Other chronic pain: Secondary | ICD-10-CM

## 2021-08-03 DIAGNOSIS — M4647 Discitis, unspecified, lumbosacral region: Secondary | ICD-10-CM

## 2021-08-03 NOTE — Telephone Encounter (Signed)
Referral placed.

## 2021-08-03 NOTE — Telephone Encounter (Signed)
Spoke with [name].  She states she called Dr. Oralia Rud office and they told her they needed the actually referral faxed to them at [phone].  I will have Dr. Diona Browner place new referral since the original referral was placed over 6 months ago.

## 2021-08-03 NOTE — Progress Notes (Signed)
Referral placed again.

## 2021-08-04 NOTE — Telephone Encounter (Signed)
New referral and recent office note faxed to Dr. Oralia Rud office at [phone].

## 2021-08-08 ENCOUNTER — Encounter: Payer: Self-pay | Admitting: Podiatry

## 2021-08-08 ENCOUNTER — Ambulatory Visit (INDEPENDENT_AMBULATORY_CARE_PROVIDER_SITE_OTHER): Payer: Medicare Other | Admitting: Podiatry

## 2021-08-08 ENCOUNTER — Other Ambulatory Visit: Payer: Self-pay

## 2021-08-08 DIAGNOSIS — M205X9 Other deformities of toe(s) (acquired), unspecified foot: Secondary | ICD-10-CM

## 2021-08-08 DIAGNOSIS — Z9889 Other specified postprocedural states: Secondary | ICD-10-CM

## 2021-08-08 NOTE — Progress Notes (Signed)
She presents today for follow-up of her tenotomy to the second toe of the left foot.  States that the big toe hurts sometimes but I think because of the shoe.  Objective: Vital signs are stable alert oriented x3 dressed her dressing of the left foot was removed demonstrating a rectus second toe with a well-healed distal clavus and chronic ulceration.  Assessment: Well-healing surgical toe.  Plan: Encouraged her to wear the Darco shoe for another week or so and then back into her regular shoes.  I will follow-up with her in a few weeks.

## 2021-08-10 ENCOUNTER — Emergency Department (HOSPITAL_BASED_OUTPATIENT_CLINIC_OR_DEPARTMENT_OTHER)
Admission: EM | Admit: 2021-08-10 | Discharge: 2021-08-10 | Disposition: A | Discharge status: Home / Self Care | Payer: Medicare Other | Attending: Emergency Medicine | Admitting: Emergency Medicine

## 2021-08-10 ENCOUNTER — Encounter (HOSPITAL_BASED_OUTPATIENT_CLINIC_OR_DEPARTMENT_OTHER): Payer: Self-pay

## 2021-08-10 ENCOUNTER — Encounter (HOSPITAL_COMMUNITY): Payer: Self-pay

## 2021-08-10 ENCOUNTER — Other Ambulatory Visit: Payer: Self-pay

## 2021-08-10 ENCOUNTER — Emergency Department (HOSPITAL_BASED_OUTPATIENT_CLINIC_OR_DEPARTMENT_OTHER): Payer: Medicare Other | Admitting: Radiology

## 2021-08-10 ENCOUNTER — Emergency Department (HOSPITAL_BASED_OUTPATIENT_CLINIC_OR_DEPARTMENT_OTHER): Payer: Medicare Other

## 2021-08-10 ENCOUNTER — Ambulatory Visit (HOSPITAL_COMMUNITY)
Admission: EM | Admit: 2021-08-10 | Discharge: 2021-08-10 | Disposition: A | Discharge status: Home / Self Care | Payer: Medicare Other | Attending: Family Medicine | Admitting: Family Medicine

## 2021-08-10 DIAGNOSIS — W19XXXA Unspecified fall, initial encounter: Secondary | ICD-10-CM

## 2021-08-10 DIAGNOSIS — R519 Headache, unspecified: Secondary | ICD-10-CM | POA: Diagnosis not present

## 2021-08-10 DIAGNOSIS — Z79899 Other long term (current) drug therapy: Secondary | ICD-10-CM | POA: Insufficient documentation

## 2021-08-10 DIAGNOSIS — M545 Low back pain, unspecified: Secondary | ICD-10-CM | POA: Diagnosis not present

## 2021-08-10 DIAGNOSIS — Z7901 Long term (current) use of anticoagulants: Secondary | ICD-10-CM | POA: Diagnosis not present

## 2021-08-10 DIAGNOSIS — Z91040 Latex allergy status: Secondary | ICD-10-CM | POA: Diagnosis not present

## 2021-08-10 DIAGNOSIS — Y92009 Unspecified place in unspecified non-institutional (private) residence as the place of occurrence of the external cause: Secondary | ICD-10-CM | POA: Insufficient documentation

## 2021-08-10 DIAGNOSIS — R102 Pelvic and perineal pain: Secondary | ICD-10-CM | POA: Diagnosis not present

## 2021-08-10 DIAGNOSIS — M549 Dorsalgia, unspecified: Secondary | ICD-10-CM

## 2021-08-10 DIAGNOSIS — M546 Pain in thoracic spine: Secondary | ICD-10-CM | POA: Diagnosis not present

## 2021-08-10 DIAGNOSIS — W000XXA Fall on same level due to ice and snow, initial encounter: Secondary | ICD-10-CM | POA: Diagnosis not present

## 2021-08-10 DIAGNOSIS — S0990XA Unspecified injury of head, initial encounter: Secondary | ICD-10-CM | POA: Insufficient documentation

## 2021-08-10 DIAGNOSIS — J45909 Unspecified asthma, uncomplicated: Secondary | ICD-10-CM | POA: Diagnosis not present

## 2021-08-10 DIAGNOSIS — E119 Type 2 diabetes mellitus without complications: Secondary | ICD-10-CM | POA: Insufficient documentation

## 2021-08-10 DIAGNOSIS — I10 Essential (primary) hypertension: Secondary | ICD-10-CM | POA: Diagnosis not present

## 2021-08-10 DIAGNOSIS — Z9229 Personal history of other drug therapy: Secondary | ICD-10-CM

## 2021-08-10 MED ORDER — CYCLOBENZAPRINE HCL 5 MG PO TABS: 5.0000 mg | ORAL_TABLET | Freq: Three times a day (TID) | ORAL | 0 refills | Status: DC | PRN

## 2021-08-10 MED ORDER — CYCLOBENZAPRINE HCL 5 MG PO TABS
5.0000 mg | ORAL_TABLET | Freq: Once | ORAL | Status: AC
  Administered 2021-08-10: 5 mg via ORAL
  Filled 2021-08-10: qty 1

## 2021-08-10 MED ORDER — FENTANYL CITRATE PF 50 MCG/ML IJ SOSY
25.0000 ug | PREFILLED_SYRINGE | Freq: Once | INTRAMUSCULAR | Status: AC
  Administered 2021-08-10: 25 ug via INTRAMUSCULAR
  Filled 2021-08-10: qty 1

## 2021-08-10 NOTE — ED Notes (Signed)
Patient transported to CT 

## 2021-08-10 NOTE — ED Notes (Signed)
Patient is being discharged from the Urgent Care and sent to the Emergency Department via POV . Per Dr Saunders Glance, patient is in need of higher level of care due to fall, headache and taking anticoagulants. Patient is aware and verbalizes understanding of plan of care.  Vitals:   08/10/21 1245 08/10/21 1247  BP: 123/65   Pulse: 64   Resp: 17   Temp:  98.1 F (36.7 C)  SpO2: 98%

## 2021-08-10 NOTE — ED Notes (Signed)
Pt denis blurry vision N/V

## 2021-08-10 NOTE — ED Triage Notes (Signed)
Patient here POV from Home after Fall.  Patient slipped on Ramp approximately at 0700.  Patient complaining of Pain to Lower Back and Head.  Patient takes Xarelto. No Neck Pain.  NAD Noted during Triage. A&Ox4. GCS 15. BIB Wheelchair. Uses Cane at Times.

## 2021-08-10 NOTE — ED Provider Notes (Signed)
[place]    CSN: [id number] Arrival date & time: 08/10/21  1209      History   Chief Complaint Chief Complaint  Patient presents with   Fall    HPI [name] is a 76 y.o. female.   Patient is here for a fall.  She was walking down a ramp this morning and fell backward;  landed on her back/butt/head.  No LOC. She did manage to get up by herself.  She is now hurting in her back, and her head is hurting a bit.  She is on blood thinners. She is on xarelto.  She does have chronic pain for which she takes tylenol w/codeine.  She has pain in her back at baseline, but this is now much worse after her fall;    Past Medical History:  Diagnosis Date   Allergic rhinitis    Allergy    Arthritis    Asthma    Chronic headache    Diabetes mellitus    DVT (deep venous thrombosis) (HCC)    Dyspnea    Heart murmur    Hypertension    Penicillin allergy 01/19/2020   Pulmonary embolism ([place])    Pulmonary hypertension ([place])    Seizures ([place])     Patient Active Problem List   Diagnosis Date Noted   Atypical atrial flutter ([place]) 06/24/2021   Chronic diastolic heart failure ([place]) 06/24/2021   Epistaxis 05/25/2021   Left upper arm pain 03/14/2021   Gastroenteritis 01/24/2021   Contusion of right lower leg 11/25/2020   Persistent atrial fibrillation (HCC)    SOB (shortness of breath) 10/14/2020   Severe persistent asthma with (acute) exacerbation 10/07/2020   Skin ulcer of buttock, limited to breakdown of skin ([place]) 08/05/2020   Controlled type 2 diabetes mellitus without complication ([place]) 00/17/4944   Arm pain, lateral, right 06/03/2020   High risk medication use 02/02/2020   Penicillin allergy 01/19/2020   Discitis of lumbosacral region 01/04/2020   Hyperlipidemia associated with type 2 diabetes mellitus ([place]) 05/07/2019   Chronic pain 12/11/2018   Obstructive sleep apnea treated with continuous positive airway pressure (CPAP) 06/03/2018   Encounter for chronic  pain management 04/01/2018   Left thyroid nodule 02/06/2018   Insomnia secondary to chronic pain 01/26/2018   Class 3 drug-induced obesity with serious comorbidity and body mass index (BMI) of 40.0 to 44.9 in adult ([place]) 12/03/2017   Graves disease 10/15/2017   Mild obstructive sleep apnea 10/15/2017   Hot flashes not due to menopause 09/27/2017   Diarrhea 01/04/2017   Chronic low back pain without sciatica 12/03/2016   Generalized abdominal pain 11/26/2016   Bilateral high frequency sensorineural hearing loss 10/24/2016   Subjective tinnitus, bilateral 10/24/2016   Candidal intertrigo 07/24/2016   Groin pain 04/27/2016   Urge incontinence 03/27/2016   Leg cramps 08/23/2015   Moderate persistent asthma 07/12/2015   Paroxysmal atrial fibrillation with rapid ventricular response ([place]) 06/03/2015   Counseling regarding end of life decision making 05/05/2015   Neuropathy due to secondary diabetes ([place]) 01/11/2015   Pain in Achilles tendon 01/11/2015   Migraine headache without aura 08/24/2014   Allergic rhinitis 03/25/2014   DVT (deep venous thrombosis) hx of  03/25/2014   Osteoarthritis of right knee 03/25/2014    ? of Seizure disorder 03/25/2014   Depression, major, in remission ([place]) 03/25/2014   Diabetes mellitus with neuropathy ([place]) 03/25/2014   Hypertension associated with diabetes ([place]) 03/25/2014   GERD (gastroesophageal reflux disease) 03/25/2014  History of pulmonary embolism 10/09/2013   Hypercoagulable state ([place]) 01/29/2011   Asthma, moderate persistent 01/24/2011   Pulmonary hypertension ([place]) 01/11/2011    Past Surgical History:  Procedure Laterality Date   ABDOMINAL HYSTERECTOMY     partial, has ovaries   CARDIAC CATHETERIZATION  12/28/2010   Mod. pulmonary hypertension, normal coronary arteries   CARDIOVERSION N/A 11/23/2020   Procedure: CARDIOVERSION;  Surgeon: Sanda Klein, MD;  Location: [facility];  Service: Cardiovascular;  Laterality: N/A;   DOPPLER  ECHOCARDIOGRAPHY  10/08/2011   EF=>55%,mild asymmetric LVH, mod. TR, mod. PH, mild to mod LA dilatation   IR LUMBAR DISC ASPIRATION W/IMG GUIDE  01/12/2020   KNEE ARTHROSCOPY Left    KNEE SURGERY     Nuclear Stress Test  05/20/2006   No ischemia   PARTIAL HYSTERECTOMY     PLANTAR FASCIA SURGERY     TONSILLECTOMY      OB History   No obstetric history on file.      Home Medications    Prior to Admission medications   Medication Sig Start Date End Date Taking? Authorizing Provider  acetaminophen (TYLENOL) 650 MG CR tablet Take 1,300 mg by mouth daily as needed for pain.    [provider]  Acetaminophen-Codeine 300-30 MG tablet Take 1 tablet by mouth every 6 (six) hours as needed for pain. 08/01/21   Bedsole, Amy E, MD  ADVAIR HFA 230-21 MCG/ACT inhaler INHALE 2 PUFFS BY MOUTH TWICE DAILY TO PREVENT COUGH OR WHEEZING. RINSE MOUTH AFTER USE 07/19/21   Kozlow, Donnamarie Poag, MD  albuterol (VENTOLIN HFA) 108 (90 Base) MCG/ACT inhaler INHALE 2 PUFFS BY MOUTH EVERY 6 HOURS AS NEEDED FOR WHEEZING OR SHORTNESS OF BREATH 12/19/20   Kozlow, Donnamarie Poag, MD  atorvastatin (LIPITOR) 10 MG tablet TAKE 1 TABLET(10 MG) BY MOUTH DAILY 05/14/21   Bedsole, Amy E, MD  Blood Glucose Monitoring Suppl (ONETOUCH VERIO) w/Device KIT Use to check blood sugar up to 2 times a day 08/16/20   Bedsole, Amy E, MD  cyclobenzaprine (FLEXERIL) 10 MG tablet TAKE 1 TABLET(10 MG) BY MOUTH AT BEDTIME AS NEEDED FOR MUSCLE SPASMS 08/01/21   Bedsole, Amy E, MD  dextromethorphan-guaiFENesin (MUCINEX DM) 30-600 MG 12hr tablet Take 1 tablet by mouth 2 (two) times daily.    [provider]  diclofenac Sodium (VOLTAREN) 1 % GEL Apply 2 g topically 4 (four) times daily. 03/21/21   Bedsole, Amy E, MD  EPINEPHrine 0.3 mg/0.3 mL IJ SOAJ injection USE AS DIRECTED FOR LIFE THREATENING ALLERGIC REACTIONS 11/29/20   Kozlow, Donnamarie Poag, MD  famotidine (PEPCID) 40 MG tablet TAKE 1 TABLET(40 MG) BY MOUTH DAILY 07/25/21   Kozlow, Donnamarie Poag, MD  fluticasone  (FLOVENT HFA) 220 MCG/ACT inhaler 2 puffs 2 times daily during [place]. 06/06/21   Kozlow, Donnamarie Poag, MD  furosemide (LASIX) 20 MG tablet Take 20 mg as needed for a weight gain of 3 pounds overnight or 5 pounds in one week,. 06/23/21   Croitoru, Mihai, MD  gabapentin (NEURONTIN) 600 MG tablet TAKE 1 TABLET BY MOUTH EVERY DAY AT BREAKFAST, 1 TABLET AT LUNCH AND 2 TABLETS AT BEDTIME 03/27/21   Hyatt, Max T, DPM  GLYXAMBI 10-5 MG TABS TAKE 1 TABLET BY MOUTH DAILY 08/22/20   Bedsole, Amy E, MD  Homeopathic Products [facility] RELIEF EX) Apply 1 spray topically daily as needed (muscle cramps).    [provider]  hydrOXYzine (ATARAX/VISTARIL) 10 MG tablet Take 1 tablet (10 mg total) by mouth daily  as needed for itching. 04/18/21   Bedsole, Amy E, MD  ipratropium (ATROVENT) 0.06 % nasal spray USE 1 TO 2 SPRAYS IN EACH NOSTRIL 1 TO 2 TIMES PER DAY 11/28/20   Kozlow, Donnamarie Poag, MD  irbesartan (AVAPRO) 150 MG tablet TAKE 1 TABLET(150 MG) BY MOUTH DAILY 09/12/20   Croitoru, Mihai, MD  Lactase (DAIRY-RELIEF PO) Take 1 capsule by mouth daily as needed (eating dairy).     [provider]  Lancets (ONETOUCH DELICA PLUS ZTIWPY09X) [place] USE TO CHECK BLOOD SUGAR UP TO TWICE DAILY 10/20/20   Bedsole, Amy E, MD  levalbuterol (XOPENEX) 1.25 MG/3ML nebulizer solution USE 1 VIAL VIA NEBULIZER EVERY 6 HOURS AS NEEDED FOR SHORTNESS OF BREATH OR WHEEZING 12/19/20   Kozlow, Donnamarie Poag, MD  liver oil-zinc oxide (DESITIN) 40 % ointment Apply 1 application topically as needed for irritation.    [provider]  meclizine (ANTIVERT) 12.5 MG tablet Take 1 tablet (12.5 mg total) by mouth 3 (three) times daily as needed for dizziness. 04/18/21   Bedsole, Amy E, MD  Menthol-Methyl Salicylate (SALONPAS JET SPRAY EX) Apply 1 spray topically daily as needed (knee pain).    [provider]  methimazole (TAPAZOLE) 5 MG tablet Take 1 tablet (5 mg total) by mouth daily. 06/01/21   Philemon Kingdom, MD  montelukast (SINGULAIR)  10 MG tablet Take 1 tablet (10 mg total) by mouth at bedtime. For asthma control. 07/03/21   Kozlow, Donnamarie Poag, MD  Nutritional Supplements (NUTRITIONAL SUPPLEMENT PO) Take by mouth. Omega XL - Take 2 gel capsules 2 times a day    [provider]  nystatin (MYCOSTATIN) 100000 UNIT/ML suspension 30m swish and swallow after Advair 2 times daily. 06/06/21   Kozlow, EDonnamarie Poag MD  ondansetron (ZOFRAN-ODT) 4 MG disintegrating tablet Take 1 tablet (4 mg total) by mouth every 8 (eight) hours as needed for nausea or vomiting. 04/18/21   Bedsole, Amy E, MD  ONETOUCH VERIO test strip USE TO CHECK BLOOD SUGAR UP TO TWICE DAILY 10/20/20   Bedsole, Amy E, MD  oxybutynin (DITROPAN-XL) 10 MG 24 hr tablet Take 10 mg by mouth daily. 05/08/21   [provider]  pantoprazole (PROTONIX) 40 MG tablet TAKE 1 TABLET(40 MG) BY MOUTH DAILY 05/29/21   Kozlow, EDonnamarie Poag MD  PATADAY 0.2 % SOLN Place 1 drop into both eyes daily as needed (allergies).  01/27/17   [provider]  Polyethyl Glycol-Propyl Glycol (SYSTANE OP) Place 1 drop into both eyes in the morning and at bedtime.    [provider]  potassium chloride SA (KLOR-CON) 20 MEQ tablet Take 1 tablet (20 mEq total) by mouth as needed (Take one daily when you need to take a Furosemide). 12/22/20   Croitoru, Mihai, MD  SPIRIVA RESPIMAT 1.25 MCG/ACT AERS INHALE 2 PUFFS INTO THE LUNGS DAILY 07/19/21   Kozlow, EDonnamarie Poag MD  tolterodine (DETROL LA) 4 MG 24 hr capsule Take 4 mg by mouth daily.    [provider]  TRULANCE 3 MG TABS Take 3 mg by mouth every other day. 12/06/18   [provider]  XARELTO 20 MG TABS tablet TAKE 1 TABLET(20 MG) BY MOUTH DAILY WITH SUPPER 02/27/21   Croitoru, MDani Gobble MD    Family History Family History  Problem Relation Age of Onset   Hypertension Mother    Clotting disorder Mother    Breast cancer Mother    Arthritis Mother    Stroke Mother    Diabetes Mother  Cancer Brother    Alcohol abuse Father     Arthritis Sister    Diabetes Sister    Multiple sclerosis Sister    Allergies Other        grandson   Allergic rhinitis Neg Hx    Angioedema Neg Hx    Asthma Neg Hx    Atopy Neg Hx    Eczema Neg Hx    Immunodeficiency Neg Hx    Urticaria Neg Hx     Social History Social History   Tobacco Use   Smoking status: Never   Smokeless tobacco: Never  Vaping Use   Vaping Use: Never used  Substance Use Topics   Alcohol use: Not Currently    Alcohol/week: 0.0 standard drinks    Comment: occ glass on wine   Drug use: No     Allergies   Latex, Penicillins, Oxycodone-acetaminophen, Lyrica [pregabalin], Nickel, Phenytoin sodium extended, and Vicodin [hydrocodone-acetaminophen]   Review of Systems Review of Systems  Constitutional: Negative.   HENT: Negative.    Respiratory: Negative.    Cardiovascular: Negative.   Musculoskeletal:  Positive for back pain and neck pain.  Neurological:  Positive for headaches.    Physical Exam Triage Vital Signs ED Triage Vitals  Enc Vitals Group     BP 08/10/21 1245 123/65     Pulse Rate 08/10/21 1245 64     Resp 08/10/21 1245 17     Temp 08/10/21 1247 98.1 F (36.7 C)     Temp Source 08/10/21 1247 Oral     SpO2 08/10/21 1245 98 %     Weight --      Height --      Head Circumference --      Peak Flow --      Pain Score 08/10/21 1244 3     Pain Loc --      Pain Edu? --      Excl. in [place]? --    No data found.  Updated Vital Signs BP 123/65 (BP Location: Left Arm)    Pulse 64    Temp 98.1 F (36.7 C) (Oral)    Resp 17    SpO2 98%   Visual Acuity Right Eye Distance:   Left Eye Distance:   Bilateral Distance:    Right Eye Near:   Left Eye Near:    Bilateral Near:     Physical Exam Constitutional:      Appearance: Normal appearance.  HENT:     Head: Normocephalic.     Comments: TTP to the back of the scalp where she hit her head Cardiovascular:     Rate and Rhythm: Normal rate.  Pulmonary:     Effort: Pulmonary effort  is normal.     Breath sounds: Normal breath sounds.  Musculoskeletal:     Comments: No TTP to the cervical spine;  +TTP to the thoracic spine;  + TTP to the lumbar spine. +TTP to the low lumbar back on the right;   She is unable to ambulate out of the wheelchair today due to severe pain;    Neurological:     Mental Status: She is alert.     UC Treatments / Results  Labs (all labs ordered are listed, but only abnormal results are displayed) Labs Reviewed - No data to display  EKG   Radiology No results found.  Procedures Procedures (including critical care time)  Medications Ordered in UC Medications - No data to display  Initial Impression / Assessment  and Plan / UC Course  I have reviewed the triage vital signs and the nursing notes.  Pertinent labs & imaging results that were available during my care of the patient were reviewed by me and considered in my medical decision making (see chart for details).   Patient was seen today for back pain and headache after a fall backward.  She did not loose consciousness, but is now in severe pain compared to her baseline.   Given she hit her head and is on xarelto, discussed her need to be seen in the ER for imaging given her high bleed risk.  She may also need imaging of her spine given the severity of her pain as well.  She is aware and agrees to go to the ER today.   Final Clinical Impressions(s) / UC Diagnoses   Final diagnoses:  Fall, initial encounter  Minor head injury, initial encounter  Midline low back pain without sciatica, unspecified chronicity  Hx of long term use of blood thinners     Discharge Instructions      You were seen today for back pain and headache after a fall.  Because of your level of pain, and because you are on a blood thinner, you should be evaluated in the ER for possible CT of the head.  I recommend you go the ER from here for further testing and treatment.     ED Prescriptions   None     PDMP not reviewed this encounter.   Rondel Oh, MD 08/10/21 [id number]

## 2021-08-10 NOTE — Discharge Instructions (Signed)
You were seen today for back pain and headache after a fall.  Because of your level of pain, and because you are on a blood thinner, you should be evaluated in the ER for possible CT of the head.  I recommend you go the ER from here for further testing and treatment.

## 2021-08-10 NOTE — ED Notes (Signed)
Pt presents with headache and back pain. States she slid this morning down the porch ramp. States there was ice and she slid.

## 2021-08-10 NOTE — ED Provider Notes (Signed)
[facility] EMERGENC[facility] DEPT Provider Note   CSN: [id number] Arrival date & time: 08/10/21  1340     History  Chief Complaint  Patient presents with   [facility] [name] is a 76 y.o. female.  HPI     76yo female with history of DM, htn, seizures, DVT and PE on xarelto presents with concern for fall and head injury. Has had back pain for a while but exacerbated by fall.   Ice on ramp, slipped and fell backwards at [employer] on bottom and hit head On xarelto  No nausea, vomiting, cough, fever Back had been bothering her but falling made back pain worse   Back pain is greater than 10/10 now after fall, has been off and on hurting for a while, today pain more frequent after the fall.  Has not had any medication for pain yet  Pain shoots up back from low back in middle No neck pain  Taking tylenol with codeine, has been a few years of pain off and onthis medication for that    Headache 8/10 , fatigue  No numbness/weakness/no change in vision/no drooping of face trouble talking or walking Since the fall today eyes feel itchy/burning Walking with pain in back No loss of control of bowel or bladder No chest pain, dyspnea, no syncope, lightheadedness, no fever, cough     Past Medical History:  Diagnosis Date   Allergic rhinitis    Allergy    Arthritis    Asthma    Chronic headache    Diabetes mellitus    DVT (deep venous thrombosis) (HCC)    Dyspnea    Heart murmur    Hypertension    Penicillin allergy 01/19/2020   Pulmonary embolism ([place])    Pulmonary hypertension ([place])    Seizures ([place])      Home Medications Prior to Admission medications   Medication Sig Start Date End Date Taking? Authorizing Provider  cyclobenzaprine (FLEXERIL) 5 MG tablet Take 1-2 tablets (5-10 mg total) by mouth 3 (three) times daily as needed for muscle spasms. 08/10/21  Yes Gareth Morgan, MD  acetaminophen (TYLENOL) 650 MG CR tablet Take 1,300 mg by mouth daily as  needed for pain.    [provider]  Acetaminophen-Codeine 300-30 MG tablet Take 1 tablet by mouth every 6 (six) hours as needed for pain. 08/01/21   Bedsole, Amy E, MD  ADVAIR HFA 230-21 MCG/ACT inhaler INHALE 2 PUFFS BY MOUTH TWICE DAILY TO PREVENT COUGH OR WHEEZING. RINSE MOUTH AFTER USE 07/19/21   Kozlow, Donnamarie Poag, MD  albuterol (VENTOLIN HFA) 108 (90 Base) MCG/ACT inhaler INHALE 2 PUFFS BY MOUTH EVERY 6 HOURS AS NEEDED FOR WHEEZING OR SHORTNESS OF BREATH 12/19/20   Kozlow, Donnamarie Poag, MD  atorvastatin (LIPITOR) 10 MG tablet TAKE 1 TABLET(10 MG) BY MOUTH DAILY 05/14/21   Bedsole, Amy E, MD  Blood Glucose Monitoring Suppl (ONETOUCH VERIO) w/Device KIT Use to check blood sugar up to 2 times a day 08/16/20   Bedsole, Amy E, MD  dextromethorphan-guaiFENesin (MUCINEX DM) 30-600 MG 12hr tablet Take 1 tablet by mouth 2 (two) times daily.    [provider]  diclofenac Sodium (VOLTAREN) 1 % GEL Apply 2 g topically 4 (four) times daily. 03/21/21   Bedsole, Amy E, MD  EPINEPHrine 0.3 mg/0.3 mL IJ SOAJ injection USE AS DIRECTED FOR LIFE THREATENING ALLERGIC REACTIONS 11/29/20   Kozlow, Donnamarie Poag, MD  famotidine (PEPCID) 40 MG tablet TAKE 1 TABLET(40 MG) BY  MOUTH DAILY 07/25/21   Kozlow, Donnamarie Poag, MD  fluticasone (FLOVENT HFA) 220 MCG/ACT inhaler 2 puffs 2 times daily during [place]. 06/06/21   Kozlow, Donnamarie Poag, MD  furosemide (LASIX) 20 MG tablet Take 20 mg as needed for a weight gain of 3 pounds overnight or 5 pounds in one week,. 06/23/21   Croitoru, Mihai, MD  gabapentin (NEURONTIN) 600 MG tablet TAKE 1 TABLET BY MOUTH EVERY DAY AT BREAKFAST, 1 TABLET AT LUNCH AND 2 TABLETS AT BEDTIME 03/27/21   Hyatt, Max T, DPM  GLYXAMBI 10-5 MG TABS TAKE 1 TABLET BY MOUTH DAILY 08/22/20   [name], MD  Homeopathic Products [facility] RELIEF EX) Apply 1 spray topically daily as needed (muscle cramps).    [provider]  hydrOXYzine (ATARAX/VISTARIL) 10 MG tablet Take 1 tablet (10 mg total) by mouth daily as  needed for itching. 04/18/21   Bedsole, Amy E, MD  ipratropium (ATROVENT) 0.06 % nasal spray USE 1 TO 2 SPRAYS IN EACH NOSTRIL 1 TO 2 TIMES PER DAY 11/28/20   Kozlow, Donnamarie Poag, MD  irbesartan (AVAPRO) 150 MG tablet TAKE 1 TABLET(150 MG) BY MOUTH DAILY 09/12/20   Croitoru, Mihai, MD  Lactase (DAIRY-RELIEF PO) Take 1 capsule by mouth daily as needed (eating dairy).     [provider]  Lancets (ONETOUCH DELICA PLUS [id number]) [place] USE TO CHECK BLOOD SUGAR UP TO TWICE DAILY 10/20/20   Bedsole, Amy E, MD  levalbuterol (XOPENEX) 1.25 MG/3ML nebulizer solution USE 1 VIAL VIA NEBULIZER EVERY 6 HOURS AS NEEDED FOR SHORTNESS OF BREATH OR WHEEZING 12/19/20   Kozlow, Donnamarie Poag, MD  liver oil-zinc oxide (DESITIN) 40 % ointment Apply 1 application topically as needed for irritation.    [provider]  meclizine (ANTIVERT) 12.5 MG tablet Take 1 tablet (12.5 mg total) by mouth 3 (three) times daily as needed for dizziness. 04/18/21   Bedsole, Amy E, MD  Menthol-Methyl Salicylate (SALONPAS JET SPRAY EX) Apply 1 spray topically daily as needed (knee pain).    [provider]  methimazole (TAPAZOLE) 5 MG tablet Take 1 tablet (5 mg total) by mouth daily. 06/01/21   Philemon Kingdom, MD  montelukast (SINGULAIR) 10 MG tablet Take 1 tablet (10 mg total) by mouth at bedtime. For asthma control. 07/03/21   Kozlow, Donnamarie Poag, MD  Nutritional Supplements (NUTRITIONAL SUPPLEMENT PO) Take by mouth. Omega XL - Take 2 gel capsules 2 times a day    [provider]  nystatin (MYCOSTATIN) 100000 UNIT/ML suspension 55m swish and swallow after Advair 2 times daily. 06/06/21   Kozlow, EDonnamarie Poag MD  ondansetron (ZOFRAN-ODT) 4 MG disintegrating tablet Take 1 tablet (4 mg total) by mouth every 8 (eight) hours as needed for nausea or vomiting. 04/18/21   Bedsole, Amy E, MD  ONETOUCH VERIO test strip USE TO CHECK BLOOD SUGAR UP TO TWICE DAILY 10/20/20   Bedsole, Amy E, MD  oxybutynin (DITROPAN-XL) 10 MG 24 hr tablet Take 10  mg by mouth daily. 05/08/21   [provider]  pantoprazole (PROTONIX) 40 MG tablet TAKE 1 TABLET(40 MG) BY MOUTH DAILY 05/29/21   Kozlow, EDonnamarie Poag MD  PATADAY 0.2 % SOLN Place 1 drop into both eyes daily as needed (allergies).  01/27/17   [provider]  Polyethyl Glycol-Propyl Glycol (SYSTANE OP) Place 1 drop into both eyes in the morning and at bedtime.    [provider]  potassium chloride SA (KLOR-CON) 20 MEQ tablet Take 1 tablet (20  mEq total) by mouth as needed (Take one daily when you need to take a Furosemide). 12/22/20   Croitoru, Mihai, MD  SPIRIVA RESPIMAT 1.25 MCG/ACT AERS INHALE 2 PUFFS INTO THE LUNGS DAILY 07/19/21   Kozlow, Donnamarie Poag, MD  tolterodine (DETROL LA) 4 MG 24 hr capsule Take 4 mg by mouth daily.    [provider]  TRULANCE 3 MG TABS Take 3 mg by mouth every other day. 12/06/18   [provider]  XARELTO 20 MG TABS tablet TAKE 1 TABLET(20 MG) BY MOUTH DAILY WITH SUPPER 02/27/21   Croitoru, Mihai, MD      Allergies    Latex, Penicillins, Oxycodone-acetaminophen, Lyrica [pregabalin], Nickel, Phenytoin sodium extended, and Vicodin [hydrocodone-acetaminophen]    Review of Systems   Review of Systems See above  Physical Exam Updated Vital Signs BP (!) 178/90 (BP Location: Right Arm)    Pulse 71    Temp (!) 97.5 F (36.4 C) (Oral)    Resp 18    Ht '5\' 4"'  (1.626 m)    Wt 89.5 kg    SpO2 100%    BMI 33.87 kg/m  Physical Exam Vitals and nursing note reviewed.  Constitutional:      General: She is not in acute distress.    Appearance: She is well-developed. She is not diaphoretic.  HENT:     Head: Normocephalic and atraumatic.  Eyes:     Conjunctiva/sclera: Conjunctivae normal.  Cardiovascular:     Rate and Rhythm: Normal rate and regular rhythm.     Heart sounds: Normal heart sounds. No murmur heard.   No friction rub. No gallop.  Pulmonary:     Effort: Pulmonary effort is normal. No respiratory distress.     Breath sounds:  Normal breath sounds. No wheezing or rales.  Abdominal:     General: There is no distension.     Palpations: Abdomen is soft.     Tenderness: There is no abdominal tenderness. There is no guarding.  Musculoskeletal:        General: Tenderness (superior thoracic tenderness T1, tenderness more diffusely lumbar spine midline and paraspinal, pelvic tenderness) present.     Cervical back: Normal range of motion.  Skin:    General: Skin is warm and dry.     Findings: No erythema or rash.  Neurological:     Mental Status: She is alert and oriented to person, place, and time.     Comments: 5/5 strength bilateral le, no numbness    ED Results / Procedures / Treatments   Labs (all labs ordered are listed, but only abnormal results are displayed) Labs Reviewed - No data to display  EKG None  Radiology DG Thoracic Spine 2 View  Result Date: 08/10/2021 CLINICAL DATA:  Fall, pain. EXAM: THORACIC SPINE 2 VIEWS COMPARISON:  None. FINDINGS: Normal alignment. No acute fracture. Normal vertebral body heights, compression deformity. Diffuse degenerative disc disease with disc space narrowing and spurring. No evidence of focal bone lesion. No paravertebral soft tissue abnormality to suggest fracture. IMPRESSION: 1. No acute fracture of the thoracic spine. 2. Degenerative disc disease. Electronically Signed   By: Keith Rake M.D.   On: 08/10/2021 17:05   DG Lumbar Spine Complete  Result Date: 08/10/2021 CLINICAL DATA:  [place] home.  Back pain. EXAM: LUMBAR SPINE - COMPLETE 4+ VIEW COMPARISON:  09/04/2020 FINDINGS: Five lumbar type vertebral bodies. Mild scoliotic curvature convex to the right. No evidence of fracture. Chronic degenerative disc disease at L4-5 and L5-S1.  Chronic lower lumbar facet osteoarthritis. Calcified fibroids incidentally noted in the pelvis. IMPRESSION: No acute or traumatic finding. Chronic degenerative disc disease and facet disease in the lower lumbar spine. Electronically  Signed   By: Nelson Chimes M.D.   On: 08/10/2021 15:43   DG Pelvis 1-2 Views  Result Date: 08/10/2021 CLINICAL DATA:  Fall, pain. EXAM: PELVIS - 1-2 VIEW COMPARISON:  None. FINDINGS: The cortical margins of the bony pelvis are intact. No fracture. Pubic symphysis and sacroiliac joints are congruent. Mild bilateral hip osteoarthritis. Both femoral heads are well-seated in the respective acetabula. Calcified uterine fibroids. IMPRESSION: No pelvic fracture. Electronically Signed   By: Keith Rake M.D.   On: 08/10/2021 17:06   CT Head Wo Contrast  Result Date: 08/12/2021 CLINICAL DATA:  Fall, headache EXAM: CT HEAD WITHOUT CONTRAST TECHNIQUE: Contiguous axial images were obtained from the base of the skull through the vertex without intravenous contrast. RADIATION DOSE REDUCTION: This exam was performed according to the departmental dose-optimization program which includes automated exposure control, adjustment of the mA and/or kV according to patient size and/or use of iterative reconstruction technique. COMPARISON:  08/10/2021 FINDINGS: Brain: No evidence of acute infarction, hemorrhage, hydrocephalus, extra-axial collection or mass lesion/mass effect. Vascular: No hyperdense vessel or unexpected calcification. Skull: Normal. Negative for fracture or focal lesion. Hyperostosis frontalis. Sinuses/Orbits: The visualized paranasal sinuses are essentially clear. The mastoid air cells are unopacified. Other: None. IMPRESSION: Normal head CT. Electronically Signed   By: Julian Hy M.D.   On: 08/12/2021 03:55   CT Head Wo Contrast  Result Date: 08/10/2021 CLINICAL DATA:  Head trauma, minor.  Fell at home. EXAM: CT HEAD WITHOUT CONTRAST TECHNIQUE: Contiguous axial images were obtained from the base of the skull through the vertex without intravenous contrast. RADIATION DOSE REDUCTION: This exam was performed according to the departmental dose-optimization program which includes automated exposure  control, adjustment of the mA and/or kV according to patient size and/or use of iterative reconstruction technique. COMPARISON:  03/08/2014 FINDINGS: Brain: The brain shows a normal appearance without evidence of malformation, atrophy, old or acute small or large vessel infarction, mass lesion, hemorrhage, hydrocephalus or extra-axial collection. Incidental chronic empty sella. Vascular: No hyperdense vessel. No evidence of atherosclerotic calcification. Skull: Normal.  No traumatic finding.  No focal bone lesion. Sinuses/Orbits: Sinuses are clear. Orbits appear normal. Mastoids are clear. Other: None significant IMPRESSION: Normal study for age.  No acute or traumatic finding. Electronically Signed   By: Nelson Chimes M.D.   On: 08/10/2021 15:40    Procedures Procedures    Medications Ordered in ED Medications  cyclobenzaprine (FLEXERIL) tablet 5 mg (5 mg Oral Given 08/10/21 1642)  fentaNYL (SUBLIMAZE) injection 25 mcg (25 mcg Intramuscular Given 08/10/21 1642)    ED Course/ Medical Decision Making/ A&P                            76yo female with history of DM, htn, seizures, DVT and PE on xarelto presents with concern for fall and head injury. Has had back pain for a while but exacerbated by fall.  CT head completed and personally evaluated by me shows no sign of fracture or intracranial bleed.  XR Tspine/Lspine/pelvis show no sign of fracture. Degenerative changes noted. Suspect worsening pain related to degenerative changes/disc disease and contusion. Discussed possibility of occult fx and need for continued follow up.  Given rx for flexeril.  Final Clinical Impression(s) / ED Diagnoses Final diagnoses:  Fall, initial encounter  Acute nonintractable headache, unspecified headache type  Injury of head, initial encounter  Acute midline back pain, unspecified back location    Rx / DC Orders ED Discharge Orders          Ordered    cyclobenzaprine (FLEXERIL) 5 MG tablet  3  times daily PRN        08/10/21 1717              Gareth Morgan, MD 08/12/21 1104

## 2021-08-12 ENCOUNTER — Other Ambulatory Visit: Payer: Self-pay

## 2021-08-12 ENCOUNTER — Encounter (HOSPITAL_BASED_OUTPATIENT_CLINIC_OR_DEPARTMENT_OTHER): Payer: Self-pay | Admitting: Emergency Medicine

## 2021-08-12 ENCOUNTER — Emergency Department (HOSPITAL_BASED_OUTPATIENT_CLINIC_OR_DEPARTMENT_OTHER)
Admission: EM | Admit: 2021-08-12 | Discharge: 2021-08-12 | Disposition: A | Discharge status: Home / Self Care | Payer: Medicare Other | Attending: Emergency Medicine | Admitting: Emergency Medicine

## 2021-08-12 ENCOUNTER — Emergency Department (HOSPITAL_BASED_OUTPATIENT_CLINIC_OR_DEPARTMENT_OTHER): Payer: Medicare Other

## 2021-08-12 DIAGNOSIS — R1110 Vomiting, unspecified: Secondary | ICD-10-CM | POA: Diagnosis not present

## 2021-08-12 DIAGNOSIS — T402X5A Adverse effect of other opioids, initial encounter: Secondary | ICD-10-CM | POA: Diagnosis not present

## 2021-08-12 DIAGNOSIS — Z20822 Contact with and (suspected) exposure to covid-19: Secondary | ICD-10-CM | POA: Diagnosis not present

## 2021-08-12 DIAGNOSIS — R519 Headache, unspecified: Secondary | ICD-10-CM | POA: Insufficient documentation

## 2021-08-12 DIAGNOSIS — T50905A Adverse effect of unspecified drugs, medicaments and biological substances, initial encounter: Secondary | ICD-10-CM

## 2021-08-12 DIAGNOSIS — F0781 Postconcussional syndrome: Secondary | ICD-10-CM | POA: Diagnosis not present

## 2021-08-12 LAB — RESP PANEL BY RT-PCR (FLU A&B, COVID) ARPGX2
Influenza A by PCR: NEGATIVE
Influenza B by PCR: NEGATIVE
SARS Coronavirus 2 by RT PCR: NEGATIVE

## 2021-08-12 MED ORDER — ACETAMINOPHEN 500 MG PO TABS
1000.0000 mg | ORAL_TABLET | Freq: Once | ORAL | Status: AC
  Administered 2021-08-12: 1000 mg via ORAL
  Filled 2021-08-12: qty 2

## 2021-08-12 MED ORDER — DIPHENHYDRAMINE HCL 12.5 MG/5ML PO ELIX
12.5000 mg | ORAL_SOLUTION | Freq: Once | ORAL | Status: AC
  Administered 2021-08-12: 12.5 mg via ORAL
  Filled 2021-08-12: qty 10

## 2021-08-12 MED ORDER — ONDANSETRON 4 MG PO TBDP
8.0000 mg | ORAL_TABLET | Freq: Once | ORAL | Status: AC
  Administered 2021-08-12: 8 mg via ORAL
  Filled 2021-08-12: qty 2

## 2021-08-12 MED ORDER — LIDOCAINE 5 % EX PTCH
2.0000 | MEDICATED_PATCH | CUTANEOUS | Status: DC
  Administered 2021-08-12: 2 via TRANSDERMAL
  Filled 2021-08-12: qty 2

## 2021-08-12 NOTE — ED Triage Notes (Signed)
°  Patient comes in with headache and N/V that has been going on since yesterday morning.  Patient had a fall and was seen on 08/10/21.  Patient takes xarelto and had head CT that was negative.  Told to come back if she had any complications so she wanted to be evaluated.  Patient A&O x4 in triage.  Pain 10/10, sharp/throbbing pain.

## 2021-08-12 NOTE — ED Provider Notes (Signed)
[facility] EMERGENCY DEPT Provider Note   CSN: [id number] Arrival date & time: 08/12/21  0222     History  Chief Complaint  Patient presents with   Headache   Emesis    [name] is a 76 y.o. female.  The history is provided by the patient and a relative.  Headache Pain location:  Frontal Quality:  Dull Radiates to:  Does not radiate Severity currently:  10/10 Severity at highest:  10/10 Onset quality:  Gradual Duration:  3 days Timing:  Constant Progression:  Unchanged Chronicity:  New Context: not loud noise and not straining   Relieved by:  Nothing Worsened by:  Nothing Ineffective treatments: tylenol with codeine. Associated symptoms: vomiting   Associated symptoms: no abdominal pain, no cough, no diarrhea, no dizziness, no eye pain, no facial pain, no fatigue, no fever, no loss of balance, no myalgias, no near-syncope, no neck pain, no neck stiffness, no numbness, no paresthesias, no photophobia, no seizures, no sinus pressure, no sore throat, no swollen glands, no syncope, no tingling, no URI, no visual change and no weakness   Risk factors: no anger   Emesis Associated symptoms: headaches   Associated symptoms: no abdominal pain, no cough, no diarrhea, no fever, no myalgias, no sore throat and no URI   Patient seen for a fall on 1/26 presents with ongoing headache post fall and then took Tylenol 3 for headache today and had emesis shortly after taking that medication.  No f/c/r.  No neck pain or stiffness, still has ongoing back pain from fall.     Home Medications Prior to Admission medications   Medication Sig Start Date End Date Taking? Authorizing Provider  acetaminophen (TYLENOL) 650 MG CR tablet Take 1,300 mg by mouth daily as needed for pain.    [provider]  Acetaminophen-Codeine 300-30 MG tablet Take 1 tablet by mouth every 6 (six) hours as needed for pain. 08/01/21   Bedsole, Amy E, MD  ADVAIR HFA 230-21 MCG/ACT inhaler  INHALE 2 PUFFS BY MOUTH TWICE DAILY TO PREVENT COUGH OR WHEEZING. RINSE MOUTH AFTER USE 07/19/21   Kozlow, Donnamarie Poag, MD  albuterol (VENTOLIN HFA) 108 (90 Base) MCG/ACT inhaler INHALE 2 PUFFS BY MOUTH EVERY 6 HOURS AS NEEDED FOR WHEEZING OR SHORTNESS OF BREATH 12/19/20   Kozlow, Donnamarie Poag, MD  atorvastatin (LIPITOR) 10 MG tablet TAKE 1 TABLET(10 MG) BY MOUTH DAILY 05/14/21   Bedsole, Amy E, MD  Blood Glucose Monitoring Suppl (ONETOUCH VERIO) w/Device KIT Use to check blood sugar up to 2 times a day 08/16/20   Diona Browner, Amy E, MD  cyclobenzaprine (FLEXERIL) 5 MG tablet Take 1-2 tablets (5-10 mg total) by mouth 3 (three) times daily as needed for muscle spasms. 08/10/21   Gareth Morgan, MD  dextromethorphan-guaiFENesin (MUCINEX DM) 30-600 MG 12hr tablet Take 1 tablet by mouth 2 (two) times daily.    [provider]  diclofenac Sodium (VOLTAREN) 1 % GEL Apply 2 g topically 4 (four) times daily. 03/21/21   Bedsole, Amy E, MD  EPINEPHrine 0.3 mg/0.3 mL IJ SOAJ injection USE AS DIRECTED FOR LIFE THREATENING ALLERGIC REACTIONS 11/29/20   Kozlow, Donnamarie Poag, MD  famotidine (PEPCID) 40 MG tablet TAKE 1 TABLET(40 MG) BY MOUTH DAILY 07/25/21   Kozlow, Donnamarie Poag, MD  fluticasone (FLOVENT HFA) 220 MCG/ACT inhaler 2 puffs 2 times daily during [place]. 06/06/21   Kozlow, Donnamarie Poag, MD  furosemide (LASIX) 20 MG tablet Take 20 mg as needed for a weight  gain of 3 pounds overnight or 5 pounds in one week,. 06/23/21   Croitoru, Mihai, MD  gabapentin (NEURONTIN) 600 MG tablet TAKE 1 TABLET BY MOUTH EVERY DAY AT BREAKFAST, 1 TABLET AT LUNCH AND 2 TABLETS AT BEDTIME 03/27/21   Hyatt, Max T, DPM  GLYXAMBI 10-5 MG TABS TAKE 1 TABLET BY MOUTH DAILY 08/22/20   Bedsole, Amy E, MD  Homeopathic Products [facility] RELIEF EX) Apply 1 spray topically daily as needed (muscle cramps).    [provider]  hydrOXYzine (ATARAX/VISTARIL) 10 MG tablet Take 1 tablet (10 mg total) by mouth daily as needed for itching. 04/18/21   Bedsole, Amy E, MD   ipratropium (ATROVENT) 0.06 % nasal spray USE 1 TO 2 SPRAYS IN EACH NOSTRIL 1 TO 2 TIMES PER DAY 11/28/20   Kozlow, Donnamarie Poag, MD  irbesartan (AVAPRO) 150 MG tablet TAKE 1 TABLET(150 MG) BY MOUTH DAILY 09/12/20   Croitoru, Mihai, MD  Lactase (DAIRY-RELIEF PO) Take 1 capsule by mouth daily as needed (eating dairy).     [provider]  Lancets (ONETOUCH DELICA PLUS [id number]) [place] USE TO CHECK BLOOD SUGAR UP TO TWICE DAILY 10/20/20   Bedsole, Amy E, MD  levalbuterol (XOPENEX) 1.25 MG/3ML nebulizer solution USE 1 VIAL VIA NEBULIZER EVERY 6 HOURS AS NEEDED FOR SHORTNESS OF BREATH OR WHEEZING 12/19/20   Kozlow, Donnamarie Poag, MD  liver oil-zinc oxide (DESITIN) 40 % ointment Apply 1 application topically as needed for irritation.    [provider]  meclizine (ANTIVERT) 12.5 MG tablet Take 1 tablet (12.5 mg total) by mouth 3 (three) times daily as needed for dizziness. 04/18/21   Bedsole, Amy E, MD  Menthol-Methyl Salicylate (SALONPAS JET SPRAY EX) Apply 1 spray topically daily as needed (knee pain).    [provider]  methimazole (TAPAZOLE) 5 MG tablet Take 1 tablet (5 mg total) by mouth daily. 06/01/21   Philemon Kingdom, MD  montelukast (SINGULAIR) 10 MG tablet Take 1 tablet (10 mg total) by mouth at bedtime. For asthma control. 07/03/21   Kozlow, Donnamarie Poag, MD  Nutritional Supplements (NUTRITIONAL SUPPLEMENT PO) Take by mouth. Omega XL - Take 2 gel capsules 2 times a day    [provider]  nystatin (MYCOSTATIN) 100000 UNIT/ML suspension 32m swish and swallow after Advair 2 times daily. 06/06/21   Kozlow, EDonnamarie Poag MD  ondansetron (ZOFRAN-ODT) 4 MG disintegrating tablet Take 1 tablet (4 mg total) by mouth every 8 (eight) hours as needed for nausea or vomiting. 04/18/21   Bedsole, Amy E, MD  ONETOUCH VERIO test strip USE TO CHECK BLOOD SUGAR UP TO TWICE DAILY 10/20/20   Bedsole, Amy E, MD  oxybutynin (DITROPAN-XL) 10 MG 24 hr tablet Take 10 mg by mouth daily. 05/08/21   [provider]  pantoprazole (PROTONIX) 40 MG tablet TAKE 1 TABLET(40 MG) BY MOUTH DAILY 05/29/21   Kozlow, EDonnamarie Poag MD  PATADAY 0.2 % SOLN Place 1 drop into both eyes daily as needed (allergies).  01/27/17   [provider]  Polyethyl Glycol-Propyl Glycol (SYSTANE OP) Place 1 drop into both eyes in the morning and at bedtime.    [provider]  potassium chloride SA (KLOR-CON) 20 MEQ tablet Take 1 tablet (20 mEq total) by mouth as needed (Take one daily when you need to take a Furosemide). 12/22/20   Croitoru, Mihai, MD  SPIRIVA RESPIMAT 1.25 MCG/ACT AERS INHALE 2 PUFFS INTO THE LUNGS DAILY 07/19/21   Kozlow, EDonnamarie Poag MD  tolterodine (DETROL  LA) 4 MG 24 hr capsule Take 4 mg by mouth daily.    [provider]  TRULANCE 3 MG TABS Take 3 mg by mouth every other day. 12/06/18   [provider]  XARELTO 20 MG TABS tablet TAKE 1 TABLET(20 MG) BY MOUTH DAILY WITH SUPPER 02/27/21   Croitoru, Mihai, MD      Allergies    Latex, Penicillins, Oxycodone-acetaminophen, Lyrica [pregabalin], Nickel, Phenytoin sodium extended, and Vicodin [hydrocodone-acetaminophen]    Review of Systems   Review of Systems  Constitutional:  Negative for fatigue and fever.  HENT:  Negative for sinus pressure and sore throat.   Eyes:  Negative for photophobia and pain.  Respiratory:  Negative for cough.   Cardiovascular:  Negative for syncope and near-syncope.  Gastrointestinal:  Positive for vomiting. Negative for abdominal pain and diarrhea.  Genitourinary:  Negative for difficulty urinating.  Musculoskeletal:  Negative for myalgias, neck pain and neck stiffness.  Skin:  Positive for rash.  Neurological:  Positive for headaches. Negative for dizziness, seizures, facial asymmetry, weakness, numbness, paresthesias and loss of balance.  Psychiatric/Behavioral:  Negative for agitation.   All other systems reviewed and are negative.  Physical Exam Updated Vital Signs BP (!) 178/92    Pulse 85     Temp 98.2 F (36.8 C) (Oral)    Resp 18    Ht _0  (1.626 m)    Wt 89.4 kg    SpO2 100%    BMI 33.81 kg/m  Physical Exam Vitals and nursing note reviewed.  Constitutional:      General: She is not in acute distress.    Appearance: Normal appearance.  HENT:     Head: Normocephalic and atraumatic.     Nose: Nose normal.     Mouth/Throat:     Mouth: Mucous membranes are moist.  Eyes:     Extraocular Movements: Extraocular movements intact.     Conjunctiva/sclera: Conjunctivae normal.     Pupils: Pupils are equal, round, and reactive to light.  Cardiovascular:     Rate and Rhythm: Normal rate and regular rhythm.     Pulses: Normal pulses.     Heart sounds: Normal heart sounds.  Pulmonary:     Effort: Pulmonary effort is normal.     Breath sounds: Normal breath sounds.  Abdominal:     General: Abdomen is flat. Bowel sounds are normal.     Palpations: Abdomen is soft.     Tenderness: There is no abdominal tenderness. There is no guarding.  Musculoskeletal:        General: Normal range of motion.     Cervical back: Normal range of motion and neck supple. No rigidity or tenderness.     Right hip: Normal.     Left hip: Normal.  Lymphadenopathy:     Cervical: No cervical adenopathy.  Skin:    General: Skin is warm and dry.     Capillary Refill: Capillary refill takes less than 2 seconds.  Neurological:     General: No focal deficit present.     Mental Status: She is alert and oriented to person, place, and time.     Deep Tendon Reflexes: Reflexes normal.  Psychiatric:        Mood and Affect: Mood normal.        Behavior: Behavior normal.    ED Results / Procedures / Treatments   Labs (all labs ordered are listed, but only abnormal results are displayed) Results for orders placed or performed  during the hospital encounter of 08/12/21  Resp Panel by RT-PCR (Flu A&B, Covid) Nasopharyngeal Swab   Specimen: Nasopharyngeal Swab; Nasopharyngeal(NP) swabs in vial transport medium   Result Value Ref Range   SARS Coronavirus 2 by RT PCR NEGATIVE NEGATIVE   Influenza A by PCR NEGATIVE NEGATIVE   Influenza B by PCR NEGATIVE NEGATIVE   *Note: Due to a large number of results and/or encounters for the requested time period, some results have not been displayed. A complete set of results can be found in Results Review.   DG Thoracic Spine 2 View  Result Date: 08/10/2021 CLINICAL DATA:  Fall, pain. EXAM: THORACIC SPINE 2 VIEWS COMPARISON:  None. FINDINGS: Normal alignment. No acute fracture. Normal vertebral body heights, compression deformity. Diffuse degenerative disc disease with disc space narrowing and spurring. No evidence of focal bone lesion. No paravertebral soft tissue abnormality to suggest fracture. IMPRESSION: 1. No acute fracture of the thoracic spine. 2. Degenerative disc disease. Electronically Signed   By: Keith Rake M.D.   On: 08/10/2021 17:05   DG Lumbar Spine Complete  Result Date: 08/10/2021 CLINICAL DATA:  [place] at home.  Back pain. EXAM: LUMBAR SPINE - COMPLETE 4+ VIEW COMPARISON:  09/04/2020 FINDINGS: Five lumbar type vertebral bodies. Mild scoliotic curvature convex to the right. No evidence of fracture. Chronic degenerative disc disease at L4-5 and L5-S1. Chronic lower lumbar facet osteoarthritis. Calcified fibroids incidentally noted in the pelvis. IMPRESSION: No acute or traumatic finding. Chronic degenerative disc disease and facet disease in the lower lumbar spine. Electronically Signed   By: Nelson Chimes M.D.   On: 08/10/2021 15:43   DG Pelvis 1-2 Views  Result Date: 08/10/2021 CLINICAL DATA:  Fall, pain. EXAM: PELVIS - 1-2 VIEW COMPARISON:  None. FINDINGS: The cortical margins of the bony pelvis are intact. No fracture. Pubic symphysis and sacroiliac joints are congruent. Mild bilateral hip osteoarthritis. Both femoral heads are well-seated in the respective acetabula. Calcified uterine fibroids. IMPRESSION: No pelvic fracture. Electronically  Signed   By: Keith Rake M.D.   On: 08/10/2021 17:06   CT Head Wo Contrast  Result Date: 08/12/2021 CLINICAL DATA:  Fall, headache EXAM: CT HEAD WITHOUT CONTRAST TECHNIQUE: Contiguous axial images were obtained from the base of the skull through the vertex without intravenous contrast. RADIATION DOSE REDUCTION: This exam was performed according to the departmental dose-optimization program which includes automated exposure control, adjustment of the mA and/or kV according to patient size and/or use of iterative reconstruction technique. COMPARISON:  08/10/2021 FINDINGS: Brain: No evidence of acute infarction, hemorrhage, hydrocephalus, extra-axial collection or mass lesion/mass effect. Vascular: No hyperdense vessel or unexpected calcification. Skull: Normal. Negative for fracture or focal lesion. Hyperostosis frontalis. Sinuses/Orbits: The visualized paranasal sinuses are essentially clear. The mastoid air cells are unopacified. Other: None. IMPRESSION: Normal head CT. Electronically Signed   By: Julian Hy M.D.   On: 08/12/2021 03:55   CT Head Wo Contrast  Result Date: 08/10/2021 CLINICAL DATA:  Head trauma, minor.  Fell at home. EXAM: CT HEAD WITHOUT CONTRAST TECHNIQUE: Contiguous axial images were obtained from the base of the skull through the vertex without intravenous contrast. RADIATION DOSE REDUCTION: This exam was performed according to the departmental dose-optimization program which includes automated exposure control, adjustment of the mA and/or kV according to patient size and/or use of iterative reconstruction technique. COMPARISON:  03/08/2014 FINDINGS: Brain: The brain shows a normal appearance without evidence of malformation, atrophy, old or acute small or large vessel infarction, mass lesion, hemorrhage,  hydrocephalus or extra-axial collection. Incidental chronic empty sella. Vascular: No hyperdense vessel. No evidence of atherosclerotic calcification. Skull: Normal.  No  traumatic finding.  No focal bone lesion. Sinuses/Orbits: Sinuses are clear. Orbits appear normal. Mastoids are clear. Other: None significant IMPRESSION: Normal study for age.  No acute or traumatic finding. Electronically Signed   By: Nelson Chimes M.D.   On: 08/10/2021 15:40     Radiology DG Thoracic Spine 2 View  Result Date: 08/10/2021 CLINICAL DATA:  Fall, pain. EXAM: THORACIC SPINE 2 VIEWS COMPARISON:  None. FINDINGS: Normal alignment. No acute fracture. Normal vertebral body heights, compression deformity. Diffuse degenerative disc disease with disc space narrowing and spurring. No evidence of focal bone lesion. No paravertebral soft tissue abnormality to suggest fracture. IMPRESSION: 1. No acute fracture of the thoracic spine. 2. Degenerative disc disease. Electronically Signed   By: Keith Rake M.D.   On: 08/10/2021 17:05   DG Lumbar Spine Complete  Result Date: 08/10/2021 CLINICAL DATA:  [place] at home.  Back pain. EXAM: LUMBAR SPINE - COMPLETE 4+ VIEW COMPARISON:  09/04/2020 FINDINGS: Five lumbar type vertebral bodies. Mild scoliotic curvature convex to the right. No evidence of fracture. Chronic degenerative disc disease at L4-5 and L5-S1. Chronic lower lumbar facet osteoarthritis. Calcified fibroids incidentally noted in the pelvis. IMPRESSION: No acute or traumatic finding. Chronic degenerative disc disease and facet disease in the lower lumbar spine. Electronically Signed   By: Nelson Chimes M.D.   On: 08/10/2021 15:43   DG Pelvis 1-2 Views  Result Date: 08/10/2021 CLINICAL DATA:  Fall, pain. EXAM: PELVIS - 1-2 VIEW COMPARISON:  None. FINDINGS: The cortical margins of the bony pelvis are intact. No fracture. Pubic symphysis and sacroiliac joints are congruent. Mild bilateral hip osteoarthritis. Both femoral heads are well-seated in the respective acetabula. Calcified uterine fibroids. IMPRESSION: No pelvic fracture. Electronically Signed   By: Keith Rake M.D.   On: 08/10/2021  17:06   CT Head Wo Contrast  Result Date: 08/12/2021 CLINICAL DATA:  Fall, headache EXAM: CT HEAD WITHOUT CONTRAST TECHNIQUE: Contiguous axial images were obtained from the base of the skull through the vertex without intravenous contrast. RADIATION DOSE REDUCTION: This exam was performed according to the departmental dose-optimization program which includes automated exposure control, adjustment of the mA and/or kV according to patient size and/or use of iterative reconstruction technique. COMPARISON:  08/10/2021 FINDINGS: Brain: No evidence of acute infarction, hemorrhage, hydrocephalus, extra-axial collection or mass lesion/mass effect. Vascular: No hyperdense vessel or unexpected calcification. Skull: Normal. Negative for fracture or focal lesion. Hyperostosis frontalis. Sinuses/Orbits: The visualized paranasal sinuses are essentially clear. The mastoid air cells are unopacified. Other: None. IMPRESSION: Normal head CT. Electronically Signed   By: Julian Hy M.D.   On: 08/12/2021 03:55   CT Head Wo Contrast  Result Date: 08/10/2021 CLINICAL DATA:  Head trauma, minor.  Fell at home. EXAM: CT HEAD WITHOUT CONTRAST TECHNIQUE: Contiguous axial images were obtained from the base of the skull through the vertex without intravenous contrast. RADIATION DOSE REDUCTION: This exam was performed according to the departmental dose-optimization program which includes automated exposure control, adjustment of the mA and/or kV according to patient size and/or use of iterative reconstruction technique. COMPARISON:  03/08/2014 FINDINGS: Brain: The brain shows a normal appearance without evidence of malformation, atrophy, old or acute small or large vessel infarction, mass lesion, hemorrhage, hydrocephalus or extra-axial collection. Incidental chronic empty sella. Vascular: No hyperdense vessel. No evidence of atherosclerotic calcification. Skull: Normal.  No traumatic finding.  No focal bone lesion. Sinuses/Orbits:  Sinuses are clear. Orbits appear normal. Mastoids are clear. Other: None significant IMPRESSION: Normal study for age.  No acute or traumatic finding. Electronically Signed   By: Nelson Chimes M.D.   On: 08/10/2021 15:40    Procedures Procedures    Medications Ordered in ED Medications  lidocaine (LIDODERM) 5 % 2 patch (2 patches Transdermal Patch Applied 08/12/21 0521)  acetaminophen (TYLENOL) tablet 1,000 mg (1,000 mg Oral Given 08/12/21 0519)  ondansetron (ZOFRAN-ODT) disintegrating tablet 8 mg (8 mg Oral Given 08/12/21 0520)  diphenhydrAMINE (BENADRYL) 12.5 MG/5ML elixir 12.5 mg (12.5 mg Oral Given 08/12/21 0520)    ED Course/ Medical Decision Making/ A&P                           Medical Decision Making Fall 1/26 with normal imaging of the back and pelvis and negative head CT.  Patient vomits after taking narcotics but took tylenol with codeine and began vomiting and came in for ongoing headache and vomiting   Amount and/or Complexity of Data Reviewed Labs: ordered.    Details: negative covid and flu Radiology: ordered.    Details: Negative repeated CT of the head  Risk OTC drugs. Prescription drug management. Risk Details: No ICH on repeat head CT.  The vomiting is clearly a side effect of the codeine in the T3.  I have advised tylenol every 6 hours for pain relief.  Copious oral fluids and limited screen time to less than 30 minutes a day for the next week.  Will prescribe lidoderm for ongoing pain post fall.      Final Clinical Impression(s) / ED Diagnoses Final diagnoses:  Post concussive syndrome  Adverse effect of drug, initial encounter   Return for intractable cough, coughing up blood, fevers > 100.4 unrelieved by medication, shortness of breath, intractable vomiting, chest pain, shortness of breath, weakness, numbness, changes in speech, facial asymmetry, abdominal pain, passing out, Inability to tolerate liquids or food, cough, altered mental status or any concerns. No  signs of systemic illness or infection. The patient is nontoxic-appearing on exam and vital signs are within normal limits.  I have reviewed the triage vital signs and the nursing notes. Pertinent labs & imaging results that were available during my care of the patient were reviewed by me and considered in my medical decision making (see chart for details). After history, exam, and medical workup I feel the patient has been appropriately medically screened and is safe for discharge home. Pertinent diagnoses were discussed with the patient. Patient was given return precautions.             Wealthy Danielski, MD 08/12/21 0600

## 2021-08-14 DIAGNOSIS — J4550 Severe persistent asthma, uncomplicated: Secondary | ICD-10-CM | POA: Diagnosis not present

## 2021-08-15 ENCOUNTER — Ambulatory Visit (INDEPENDENT_AMBULATORY_CARE_PROVIDER_SITE_OTHER): Payer: Medicare Other | Admitting: Family Medicine

## 2021-08-15 ENCOUNTER — Encounter: Payer: Self-pay | Admitting: Family Medicine

## 2021-08-15 ENCOUNTER — Other Ambulatory Visit: Payer: Self-pay

## 2021-08-15 ENCOUNTER — Ambulatory Visit (INDEPENDENT_AMBULATORY_CARE_PROVIDER_SITE_OTHER): Payer: Medicare Other | Admitting: *Deleted

## 2021-08-15 VITALS — BP 128/70 | HR 80 | Temp 98.2°F | Ht 64.0 in | Wt 193.0 lb

## 2021-08-15 DIAGNOSIS — J4550 Severe persistent asthma, uncomplicated: Secondary | ICD-10-CM | POA: Diagnosis not present

## 2021-08-15 DIAGNOSIS — S060X0A Concussion without loss of consciousness, initial encounter: Secondary | ICD-10-CM | POA: Diagnosis not present

## 2021-08-15 DIAGNOSIS — M545 Low back pain, unspecified: Secondary | ICD-10-CM

## 2021-08-15 DIAGNOSIS — W19XXXD Unspecified fall, subsequent encounter: Secondary | ICD-10-CM

## 2021-08-15 NOTE — Assessment & Plan Note (Signed)
Accidental fall.  No proceeding symptoms. Reviewed fall prevention and safety at home and when out of house.  Use 4 prong cane or walker

## 2021-08-15 NOTE — Assessment & Plan Note (Signed)
Improving with brain rest. Encouraged continuing until able to advance without headache.

## 2021-08-15 NOTE — Patient Instructions (Signed)
Continue brain rest and advance as tolerated. Tylenol for pain.

## 2021-08-15 NOTE — Progress Notes (Signed)
Patient ID: [name], female    DOB: May 23, 1946, 76 y.o.   MRN: [id number]  This visit was conducted in person.  BP 128/70    Pulse 80    Temp 98.2 F (36.8 C) (Temporal)    Ht _0  (1.626 m)    Wt 193 lb (87.5 kg)    SpO2 96%    BMI 33.13 kg/m    CC:  Chief Complaint  Patient presents with   Follow-up    ER Visit 08/10/2021 & 08/12/2021    Subjective:   HPI: [name] is a 76 y.o. female presenting on 08/15/2021 for Follow-up (ER Visit 08/10/2021 & 08/12/2021)  ER visits from 1/26 and 1/28 reviewed in detail.  Had fall 1/28, no LOC.Marland Kitchen resulting in head injury, low back pain.   She fell due to slipping on ice. No proceeding symptoms.   Head CT nml and imaging of thoracic, lumbar spine and pelvis: showed degenerative changes, no fracture.  Treated with pain med in ER.  Given rx for flexeril.  Returned to ER on 1/28 with headache and vomiting.. repeat head CT unremarkable. Dx with concussion.  Neg COVID and flu test.  Felt vomiting may be related to codeine use or concussion.  Recommended tylenol and lidocaine patch ( this helped a lot with pain).   Today she reports she has been doing brain rest.  No further headache and no further vomiting.  Back pain is back  to baseline.   No new numbness, or weakness.  No change in urine  control.   Relevant past medical, surgical, family and social history reviewed and updated as indicated. Interim medical history since our last visit reviewed. Allergies and medications reviewed and updated. Outpatient Medications Prior to Visit  Medication Sig Dispense Refill   acetaminophen (TYLENOL) 650 MG CR tablet Take 1,300 mg by mouth daily as needed for pain.     ADVAIR HFA 301-60 MCG/ACT inhaler INHALE 2 PUFFS BY MOUTH TWICE DAILY TO PREVENT COUGH OR WHEEZING. RINSE MOUTH AFTER USE 12 g 5   albuterol (VENTOLIN HFA) 108 (90 Base) MCG/ACT inhaler INHALE 2 PUFFS BY MOUTH EVERY 6 HOURS AS NEEDED FOR WHEEZING OR SHORTNESS OF BREATH  54 g 0   atorvastatin (LIPITOR) 10 MG tablet TAKE 1 TABLET(10 MG) BY MOUTH DAILY 90 tablet 3   Blood Glucose Monitoring Suppl (ONETOUCH VERIO) w/Device KIT Use to check blood sugar up to 2 times a day 1 kit 0   cyclobenzaprine (FLEXERIL) 5 MG tablet Take 1-2 tablets (5-10 mg total) by mouth 3 (three) times daily as needed for muscle spasms. 20 tablet 0   dextromethorphan-guaiFENesin (MUCINEX DM) 30-600 MG 12hr tablet Take 1 tablet by mouth 2 (two) times daily.     diclofenac Sodium (VOLTAREN) 1 % GEL Apply 2 g topically 4 (four) times daily. 400 g 1   EPINEPHrine 0.3 mg/0.3 mL IJ SOAJ injection USE AS DIRECTED FOR LIFE THREATENING ALLERGIC REACTIONS 2 each 2   famotidine (PEPCID) 40 MG tablet TAKE 1 TABLET(40 MG) BY MOUTH DAILY 30 tablet 5   fluticasone (FLOVENT HFA) 220 MCG/ACT inhaler 2 puffs 2 times daily during Flare Up. 12 g 5   furosemide (LASIX) 20 MG tablet Take 20 mg as needed for a weight gain of 3 pounds overnight or 5 pounds in one week,. 30 tablet 3   gabapentin (NEURONTIN) 600 MG tablet TAKE 1 TABLET BY MOUTH EVERY DAY AT BREAKFAST, 1 TABLET AT LUNCH AND 2 TABLETS  AT BEDTIME 360 tablet 1   GLYXAMBI 10-5 MG TABS TAKE 1 TABLET BY MOUTH DAILY 30 tablet 11   Homeopathic Products (THERAWORX RELIEF EX) Apply 1 spray topically daily as needed (muscle cramps).     hydrOXYzine (ATARAX/VISTARIL) 10 MG tablet Take 1 tablet (10 mg total) by mouth daily as needed for itching. 30 tablet 2   ipratropium (ATROVENT) 0.06 % nasal spray USE 1 TO 2 SPRAYS IN EACH NOSTRIL 1 TO 2 TIMES PER DAY 15 mL 3   irbesartan (AVAPRO) 150 MG tablet TAKE 1 TABLET(150 MG) BY MOUTH DAILY 30 tablet 8   Lactase (DAIRY-RELIEF PO) Take 1 capsule by mouth daily as needed (eating dairy).      Lancets (ONETOUCH DELICA PLUS [id number]) MISC USE TO CHECK BLOOD SUGAR UP TO TWICE DAILY 100 each 5   levalbuterol (XOPENEX) 1.25 MG/3ML nebulizer solution USE 1 VIAL VIA NEBULIZER EVERY 6 HOURS AS NEEDED FOR SHORTNESS OF BREATH OR  WHEEZING 900 mL 1   liver oil-zinc oxide (DESITIN) 40 % ointment Apply 1 application topically as needed for irritation.     meclizine (ANTIVERT) 12.5 MG tablet Take 1 tablet (12.5 mg total) by mouth 3 (three) times daily as needed for dizziness. 15 tablet 0   Menthol-Methyl Salicylate (SALONPAS JET SPRAY EX) Apply 1 spray topically daily as needed (knee pain).     methimazole (TAPAZOLE) 5 MG tablet Take 1 tablet (5 mg total) by mouth daily. 90 tablet 3   montelukast (SINGULAIR) 10 MG tablet Take 1 tablet (10 mg total) by mouth at bedtime. For asthma control. 90 tablet 1   Nutritional Supplements (NUTRITIONAL SUPPLEMENT PO) Take by mouth. Omega XL - Take 2 gel capsules 2 times a day     nystatin (MYCOSTATIN) 100000 UNIT/ML suspension 29m swish and swallow after Advair 2 times daily. 300 mL 5   ondansetron (ZOFRAN-ODT) 4 MG disintegrating tablet Take 1 tablet (4 mg total) by mouth every 8 (eight) hours as needed for nausea or vomiting. 20 tablet 0   ONETOUCH VERIO test strip USE TO CHECK BLOOD SUGAR UP TO TWICE DAILY 100 strip 5   oxybutynin (DITROPAN-XL) 10 MG 24 hr tablet Take 10 mg by mouth daily.     pantoprazole (PROTONIX) 40 MG tablet TAKE 1 TABLET(40 MG) BY MOUTH DAILY 90 tablet 1   PATADAY 0.2 % SOLN Place 1 drop into both eyes daily as needed (allergies).   4   Polyethyl Glycol-Propyl Glycol (SYSTANE OP) Place 1 drop into both eyes in the morning and at bedtime.     potassium chloride SA (KLOR-CON) 20 MEQ tablet Take 1 tablet (20 mEq total) by mouth as needed (Take one daily when you need to take a Furosemide). 30 tablet 0   SPIRIVA RESPIMAT 1.25 MCG/ACT AERS INHALE 2 PUFFS INTO THE LUNGS DAILY 4 g 5   tolterodine (DETROL LA) 4 MG 24 hr capsule Take 4 mg by mouth daily.     TRULANCE 3 MG TABS Take 3 mg by mouth every other day.     XARELTO 20 MG TABS tablet TAKE 1 TABLET(20 MG) BY MOUTH DAILY WITH SUPPER 90 tablet 1   Acetaminophen-Codeine 300-30 MG tablet Take 1 tablet by mouth every 6  (six) hours as needed for pain. (Patient not taking: Reported on 08/15/2021) 30 tablet 0   Facility-Administered Medications Prior to Visit  Medication Dose Route Frequency Provider Last Rate Last Admin   omalizumab (Arvid Right injection 300 mg  300 mg Subcutaneous Q28 days Kozlow,  Donnamarie Poag, MD   300 mg at 08/15/21 1107     Per HPI unless specifically indicated in ROS section below Review of Systems  Constitutional:  Negative for fatigue and fever.  HENT:  Negative for congestion.   Eyes:  Negative for pain.  Respiratory:  Negative for cough and shortness of breath.   Cardiovascular:  Negative for chest pain, palpitations and leg swelling.  Gastrointestinal:  Negative for abdominal pain.  Genitourinary:  Negative for dysuria and vaginal bleeding.  Musculoskeletal:  Positive for back pain.  Neurological:  Positive for headaches. Negative for syncope, weakness, light-headedness and numbness.  Psychiatric/Behavioral:  Negative for dysphoric mood.   Objective:  BP 128/70    Pulse 80    Temp 98.2 F (36.8 C) (Temporal)    Ht _0  (1.626 m)    Wt 193 lb (87.5 kg)    SpO2 96%    BMI 33.13 kg/m   Wt Readings from Last 3 Encounters:  08/15/21 193 lb (87.5 kg)  08/12/21 197 lb (89.4 kg)  08/10/21 197 lb 5 oz (89.5 kg)      Physical Exam Constitutional:      General: She is not in acute distress.    Appearance: Normal appearance. She is well-developed. She is obese. She is not ill-appearing or toxic-appearing.  HENT:     Head: Normocephalic.     Right Ear: Hearing, tympanic membrane, ear canal and external ear normal. Tympanic membrane is not erythematous, retracted or bulging.     Left Ear: Hearing, tympanic membrane, ear canal and external ear normal. Tympanic membrane is not erythematous, retracted or bulging.     Nose: No mucosal edema or rhinorrhea.     Right Sinus: No maxillary sinus tenderness or frontal sinus tenderness.     Left Sinus: No maxillary sinus tenderness or frontal sinus  tenderness.     Mouth/Throat:     Pharynx: Uvula midline.  Eyes:     General: Lids are normal. Lids are everted, no foreign bodies appreciated.     Conjunctiva/sclera: Conjunctivae normal.     Pupils: Pupils are equal, round, and reactive to light.  Neck:     Thyroid: No thyroid mass or thyromegaly.     Vascular: No carotid bruit.     Trachea: Trachea normal.  Cardiovascular:     Rate and Rhythm: Normal rate and regular rhythm.     Pulses: Normal pulses.     Heart sounds: Normal heart sounds, S1 normal and S2 normal. No murmur heard.   No friction rub. No gallop.  Pulmonary:     Effort: Pulmonary effort is normal. No tachypnea or respiratory distress.     Breath sounds: Normal breath sounds. No decreased breath sounds, wheezing, rhonchi or rales.  Abdominal:     General: Bowel sounds are normal.     Palpations: Abdomen is soft.     Tenderness: There is no abdominal tenderness.  Musculoskeletal:     Cervical back: Normal range of motion and neck supple.     Thoracic back: Tenderness and bony tenderness present. Decreased range of motion.     Lumbar back: Tenderness and bony tenderness present. Decreased range of motion. Negative right straight leg raise test and negative left straight leg raise test.     Right hip: Normal. No bony tenderness. Normal range of motion. Normal strength.     Left hip: Normal. No bony tenderness. Normal range of motion. Normal strength.  Skin:    General: Skin is warm  and dry.     Findings: No rash.  Neurological:     Mental Status: She is alert.  Psychiatric:        Mood and Affect: Mood is not anxious or depressed.        Speech: Speech normal.        Behavior: Behavior normal. Behavior is cooperative.        Thought Content: Thought content normal.        Judgment: Judgment normal.      Results for orders placed or performed during the hospital encounter of 08/12/21  Resp Panel by RT-PCR (Flu A&B, Covid) Nasopharyngeal Swab   Specimen:  Nasopharyngeal Swab; Nasopharyngeal(NP) swabs in vial transport medium  Result Value Ref Range   SARS Coronavirus 2 by RT PCR NEGATIVE NEGATIVE   Influenza A by PCR NEGATIVE NEGATIVE   Influenza B by PCR NEGATIVE NEGATIVE   *Note: Due to a large number of results and/or encounters for the requested time period, some results have not been displayed. A complete set of results can be found in Results Review.    This visit occurred during the SARS-CoV-2 public health emergency.  Safety protocols were in place, including screening questions prior to the visit, additional usage of staff PPE, and extensive cleaning of exam room while observing appropriate contact time as indicated for disinfecting solutions.   COVID 19 screen:  No recent travel or known exposure to COVID19 The patient denies respiratory symptoms of COVID 19 at this time. The importance of social distancing was discussed today.   Assessment and Plan    Problem List Items Addressed This Visit     Acute midline low back pain without sciatica    On top of chronic pain .  Per pt now back at baseline.  Use tylenol for pain. She will continue lidocaine patches as long as some can help her apply it.      Concussion with no loss of consciousness - Primary    Improving with brain rest. Encouraged continuing until able to advance without headache.       Fall    Accidental fall.  No proceeding symptoms. Reviewed fall prevention and safety at home and when out of house.  Use 4 prong cane or walker        [name], MD

## 2021-08-15 NOTE — Assessment & Plan Note (Signed)
On top of chronic pain .  Per pt now back at baseline.  Use tylenol for pain. She will continue lidocaine patches as long as some can help her apply it.

## 2021-08-28 DIAGNOSIS — M48061 Spinal stenosis, lumbar region without neurogenic claudication: Secondary | ICD-10-CM | POA: Diagnosis not present

## 2021-08-28 DIAGNOSIS — M5136 Other intervertebral disc degeneration, lumbar region: Secondary | ICD-10-CM | POA: Diagnosis not present

## 2021-08-28 DIAGNOSIS — M47816 Spondylosis without myelopathy or radiculopathy, lumbar region: Secondary | ICD-10-CM | POA: Diagnosis not present

## 2021-08-31 ENCOUNTER — Other Ambulatory Visit: Payer: Self-pay | Admitting: Cardiovascular Disease

## 2021-09-01 NOTE — Telephone Encounter (Signed)
Prescription refill request for Xarelto received.  Indication:Afib Last office visit:12/22 Weight:87.5 kg Age:76 Scr:0.9 CrCl:74.6 ml/min  Prescription refilled

## 2021-09-06 DIAGNOSIS — R682 Dry mouth, unspecified: Secondary | ICD-10-CM | POA: Diagnosis not present

## 2021-09-06 DIAGNOSIS — J358 Other chronic diseases of tonsils and adenoids: Secondary | ICD-10-CM | POA: Diagnosis not present

## 2021-09-06 DIAGNOSIS — R0982 Postnasal drip: Secondary | ICD-10-CM | POA: Diagnosis not present

## 2021-09-06 DIAGNOSIS — R042 Hemoptysis: Secondary | ICD-10-CM | POA: Diagnosis not present

## 2021-09-10 ENCOUNTER — Other Ambulatory Visit: Payer: Self-pay | Admitting: Family Medicine

## 2021-09-12 ENCOUNTER — Other Ambulatory Visit: Payer: Self-pay

## 2021-09-12 ENCOUNTER — Ambulatory Visit (INDEPENDENT_AMBULATORY_CARE_PROVIDER_SITE_OTHER): Payer: Medicare Other

## 2021-09-12 DIAGNOSIS — J4550 Severe persistent asthma, uncomplicated: Secondary | ICD-10-CM

## 2021-09-18 ENCOUNTER — Telehealth: Payer: Self-pay | Admitting: Cardiovascular Disease

## 2021-09-18 MED ORDER — IRBESARTAN 150 MG PO TABS: 150.0000 mg | ORAL_TABLET | Freq: Every day | ORAL | 2 refills | Status: DC

## 2021-09-18 NOTE — Telephone Encounter (Signed)
?*  STAT* If patient is at the pharmacy, call can be transferred to refill team. ? ? ?1. Which medications need to be refilled? (please list name of each medication and dose if known) irbesartan (AVAPRO) 150 MG tablet ? ?2. Which pharmacy/location (including street and city if local pharmacy) is medication to be sent to? [id number] - [place], [place] ? ?3. Do they need a 30 day or 90 day supply? 90 day ? ?Patient is out of medication.   ?

## 2021-09-18 NOTE — Telephone Encounter (Signed)
Refills has been sent to the pharmacy. 

## 2021-09-25 DIAGNOSIS — Z86718 Personal history of other venous thrombosis and embolism: Secondary | ICD-10-CM | POA: Diagnosis not present

## 2021-09-25 DIAGNOSIS — J4540 Moderate persistent asthma, uncomplicated: Secondary | ICD-10-CM | POA: Diagnosis not present

## 2021-09-25 DIAGNOSIS — I272 Pulmonary hypertension, unspecified: Secondary | ICD-10-CM | POA: Diagnosis not present

## 2021-09-25 DIAGNOSIS — K219 Gastro-esophageal reflux disease without esophagitis: Secondary | ICD-10-CM | POA: Diagnosis not present

## 2021-09-25 DIAGNOSIS — E119 Type 2 diabetes mellitus without complications: Secondary | ICD-10-CM | POA: Diagnosis not present

## 2021-09-25 DIAGNOSIS — E0500 Thyrotoxicosis with diffuse goiter without thyrotoxic crisis or storm: Secondary | ICD-10-CM | POA: Diagnosis not present

## 2021-09-25 DIAGNOSIS — G4733 Obstructive sleep apnea (adult) (pediatric): Secondary | ICD-10-CM | POA: Diagnosis not present

## 2021-09-25 DIAGNOSIS — M1711 Unilateral primary osteoarthritis, right knee: Secondary | ICD-10-CM | POA: Diagnosis not present

## 2021-09-25 DIAGNOSIS — I152 Hypertension secondary to endocrine disorders: Secondary | ICD-10-CM | POA: Diagnosis not present

## 2021-09-25 DIAGNOSIS — Z01818 Encounter for other preprocedural examination: Secondary | ICD-10-CM | POA: Diagnosis not present

## 2021-09-25 DIAGNOSIS — K5901 Slow transit constipation: Secondary | ICD-10-CM | POA: Insufficient documentation

## 2021-09-25 DIAGNOSIS — F325 Major depressive disorder, single episode, in full remission: Secondary | ICD-10-CM | POA: Diagnosis not present

## 2021-09-25 DIAGNOSIS — I5032 Chronic diastolic (congestive) heart failure: Secondary | ICD-10-CM | POA: Diagnosis not present

## 2021-09-25 DIAGNOSIS — Z6841 Body Mass Index (BMI) 40.0 and over, adult: Secondary | ICD-10-CM | POA: Diagnosis not present

## 2021-09-25 DIAGNOSIS — E661 Drug-induced obesity: Secondary | ICD-10-CM | POA: Diagnosis not present

## 2021-09-25 DIAGNOSIS — E785 Hyperlipidemia, unspecified: Secondary | ICD-10-CM | POA: Diagnosis not present

## 2021-09-25 DIAGNOSIS — E1159 Type 2 diabetes mellitus with other circulatory complications: Secondary | ICD-10-CM | POA: Diagnosis not present

## 2021-09-25 DIAGNOSIS — I484 Atypical atrial flutter: Secondary | ICD-10-CM | POA: Diagnosis not present

## 2021-09-25 DIAGNOSIS — G894 Chronic pain syndrome: Secondary | ICD-10-CM | POA: Diagnosis not present

## 2021-09-25 DIAGNOSIS — E1169 Type 2 diabetes mellitus with other specified complication: Secondary | ICD-10-CM | POA: Diagnosis not present

## 2021-09-25 DIAGNOSIS — R634 Abnormal weight loss: Secondary | ICD-10-CM | POA: Diagnosis not present

## 2021-09-26 ENCOUNTER — Other Ambulatory Visit: Payer: Self-pay | Admitting: *Deleted

## 2021-09-26 ENCOUNTER — Telehealth: Payer: Self-pay | Admitting: Allergy and Immunology

## 2021-09-26 MED ORDER — IPRATROPIUM BROMIDE 0.06 % NA SOLN: NASAL | 5 refills | Status: DC

## 2021-09-26 NOTE — Telephone Encounter (Signed)
Patient called back and I advised per Dr. Neldon Mc to use the Ipratropium nasal spray for her dry nose. I sent in refills for the patient. Patient verbalized understanding and will call back if she needs anything further.  ?

## 2021-09-26 NOTE — Telephone Encounter (Signed)
Called and left a voicemail asking for patient to return call to discuss.  °

## 2021-09-26 NOTE — Telephone Encounter (Signed)
Pt would like to know which nasal spray Dr. Neldon Mc recommends for her, patient states her nose is dry - and feels as if something is there for her to blow out - once patient attempts to blow her nose nothing comes out. Patient states this has been going on for about a week and a half.  ? ?Please advise ?

## 2021-09-28 DIAGNOSIS — Z008 Encounter for other general examination: Secondary | ICD-10-CM | POA: Diagnosis not present

## 2021-10-04 ENCOUNTER — Other Ambulatory Visit: Payer: Self-pay

## 2021-10-04 ENCOUNTER — Encounter: Payer: Self-pay | Admitting: Podiatry

## 2021-10-04 ENCOUNTER — Ambulatory Visit (INDEPENDENT_AMBULATORY_CARE_PROVIDER_SITE_OTHER): Payer: Medicare Other | Admitting: Podiatry

## 2021-10-04 DIAGNOSIS — D689 Coagulation defect, unspecified: Secondary | ICD-10-CM | POA: Diagnosis not present

## 2021-10-04 DIAGNOSIS — E1142 Type 2 diabetes mellitus with diabetic polyneuropathy: Secondary | ICD-10-CM | POA: Diagnosis not present

## 2021-10-04 DIAGNOSIS — M79676 Pain in unspecified toe(s): Secondary | ICD-10-CM

## 2021-10-04 DIAGNOSIS — B351 Tinea unguium: Secondary | ICD-10-CM

## 2021-10-04 DIAGNOSIS — D2371 Other benign neoplasm of skin of right lower limb, including hip: Secondary | ICD-10-CM

## 2021-10-04 DIAGNOSIS — D2372 Other benign neoplasm of skin of left lower limb, including hip: Secondary | ICD-10-CM

## 2021-10-04 NOTE — Progress Notes (Signed)
She presents today for follow-up of her tenotomy states that the toe does not hurt anymore as she is straight.  She is also complaining of painful calluses and toenails bilateral. ? ?Objective: Vital signs are stable alert and oriented x3 pulses are palpable.  Toes gone on to heal uneventfully.  Toenails are long thick yellow dystrophic clinical mycotic with multiple benign keratotic lesions. ? ?Assessment: Pain in limb secondary to onychomycosis diabetic peripheral neuropathy and benign skin formation. ? ?Assessment: Debrided reactive hyperkeratotic tissue debrided nails bilaterally.  Follow-up with her in 3 months ?

## 2021-10-09 DIAGNOSIS — Z20822 Contact with and (suspected) exposure to covid-19: Secondary | ICD-10-CM | POA: Diagnosis not present

## 2021-10-10 ENCOUNTER — Ambulatory Visit: Payer: Medicare Other

## 2021-10-10 DIAGNOSIS — Z809 Family history of malignant neoplasm, unspecified: Secondary | ICD-10-CM | POA: Diagnosis not present

## 2021-10-10 DIAGNOSIS — G8918 Other acute postprocedural pain: Secondary | ICD-10-CM | POA: Diagnosis not present

## 2021-10-10 DIAGNOSIS — F325 Major depressive disorder, single episode, in full remission: Secondary | ICD-10-CM | POA: Diagnosis not present

## 2021-10-10 DIAGNOSIS — Z9989 Dependence on other enabling machines and devices: Secondary | ICD-10-CM | POA: Diagnosis not present

## 2021-10-10 DIAGNOSIS — R011 Cardiac murmur, unspecified: Secondary | ICD-10-CM | POA: Diagnosis not present

## 2021-10-10 DIAGNOSIS — M1711 Unilateral primary osteoarthritis, right knee: Secondary | ICD-10-CM | POA: Diagnosis not present

## 2021-10-10 DIAGNOSIS — M25561 Pain in right knee: Secondary | ICD-10-CM | POA: Diagnosis not present

## 2021-10-10 DIAGNOSIS — J4540 Moderate persistent asthma, uncomplicated: Secondary | ICD-10-CM | POA: Diagnosis not present

## 2021-10-10 DIAGNOSIS — K219 Gastro-esophageal reflux disease without esophagitis: Secondary | ICD-10-CM | POA: Diagnosis not present

## 2021-10-10 DIAGNOSIS — M25761 Osteophyte, right knee: Secondary | ICD-10-CM | POA: Diagnosis not present

## 2021-10-10 DIAGNOSIS — M549 Dorsalgia, unspecified: Secondary | ICD-10-CM | POA: Diagnosis not present

## 2021-10-10 DIAGNOSIS — I5032 Chronic diastolic (congestive) heart failure: Secondary | ICD-10-CM | POA: Diagnosis not present

## 2021-10-10 DIAGNOSIS — Z8739 Personal history of other diseases of the musculoskeletal system and connective tissue: Secondary | ICD-10-CM | POA: Diagnosis not present

## 2021-10-10 DIAGNOSIS — Z7951 Long term (current) use of inhaled steroids: Secondary | ICD-10-CM | POA: Diagnosis not present

## 2021-10-10 DIAGNOSIS — Z86718 Personal history of other venous thrombosis and embolism: Secondary | ICD-10-CM | POA: Diagnosis not present

## 2021-10-10 DIAGNOSIS — E1169 Type 2 diabetes mellitus with other specified complication: Secondary | ICD-10-CM | POA: Diagnosis not present

## 2021-10-10 DIAGNOSIS — G4733 Obstructive sleep apnea (adult) (pediatric): Secondary | ICD-10-CM | POA: Diagnosis not present

## 2021-10-10 DIAGNOSIS — Z86711 Personal history of pulmonary embolism: Secondary | ICD-10-CM | POA: Diagnosis not present

## 2021-10-10 DIAGNOSIS — M2391 Unspecified internal derangement of right knee: Secondary | ICD-10-CM | POA: Diagnosis not present

## 2021-10-10 DIAGNOSIS — K5901 Slow transit constipation: Secondary | ICD-10-CM | POA: Diagnosis not present

## 2021-10-10 DIAGNOSIS — Z471 Aftercare following joint replacement surgery: Secondary | ICD-10-CM | POA: Diagnosis not present

## 2021-10-10 DIAGNOSIS — E785 Hyperlipidemia, unspecified: Secondary | ICD-10-CM | POA: Diagnosis not present

## 2021-10-10 DIAGNOSIS — Z8249 Family history of ischemic heart disease and other diseases of the circulatory system: Secondary | ICD-10-CM | POA: Diagnosis not present

## 2021-10-10 DIAGNOSIS — Z96651 Presence of right artificial knee joint: Secondary | ICD-10-CM | POA: Diagnosis not present

## 2021-10-10 DIAGNOSIS — E0500 Thyrotoxicosis with diffuse goiter without thyrotoxic crisis or storm: Secondary | ICD-10-CM | POA: Diagnosis not present

## 2021-10-10 DIAGNOSIS — E119 Type 2 diabetes mellitus without complications: Secondary | ICD-10-CM | POA: Diagnosis not present

## 2021-10-10 DIAGNOSIS — E1159 Type 2 diabetes mellitus with other circulatory complications: Secondary | ICD-10-CM | POA: Diagnosis not present

## 2021-10-10 DIAGNOSIS — I272 Pulmonary hypertension, unspecified: Secondary | ICD-10-CM | POA: Diagnosis not present

## 2021-10-10 DIAGNOSIS — G894 Chronic pain syndrome: Secondary | ICD-10-CM | POA: Diagnosis not present

## 2021-10-10 DIAGNOSIS — I4891 Unspecified atrial fibrillation: Secondary | ICD-10-CM | POA: Diagnosis not present

## 2021-10-10 DIAGNOSIS — I484 Atypical atrial flutter: Secondary | ICD-10-CM | POA: Diagnosis not present

## 2021-10-10 DIAGNOSIS — I110 Hypertensive heart disease with heart failure: Secondary | ICD-10-CM | POA: Diagnosis not present

## 2021-10-11 DIAGNOSIS — J4540 Moderate persistent asthma, uncomplicated: Secondary | ICD-10-CM | POA: Diagnosis not present

## 2021-10-11 DIAGNOSIS — Z86718 Personal history of other venous thrombosis and embolism: Secondary | ICD-10-CM | POA: Diagnosis not present

## 2021-10-11 DIAGNOSIS — M2391 Unspecified internal derangement of right knee: Secondary | ICD-10-CM | POA: Diagnosis not present

## 2021-10-11 DIAGNOSIS — G894 Chronic pain syndrome: Secondary | ICD-10-CM | POA: Diagnosis not present

## 2021-10-11 DIAGNOSIS — E1169 Type 2 diabetes mellitus with other specified complication: Secondary | ICD-10-CM | POA: Diagnosis not present

## 2021-10-11 DIAGNOSIS — F325 Major depressive disorder, single episode, in full remission: Secondary | ICD-10-CM | POA: Diagnosis not present

## 2021-10-11 DIAGNOSIS — K5901 Slow transit constipation: Secondary | ICD-10-CM | POA: Diagnosis not present

## 2021-10-11 DIAGNOSIS — I5032 Chronic diastolic (congestive) heart failure: Secondary | ICD-10-CM | POA: Diagnosis not present

## 2021-10-11 DIAGNOSIS — I110 Hypertensive heart disease with heart failure: Secondary | ICD-10-CM | POA: Diagnosis not present

## 2021-10-11 DIAGNOSIS — K219 Gastro-esophageal reflux disease without esophagitis: Secondary | ICD-10-CM | POA: Diagnosis not present

## 2021-10-11 DIAGNOSIS — M25761 Osteophyte, right knee: Secondary | ICD-10-CM | POA: Diagnosis not present

## 2021-10-11 DIAGNOSIS — E0500 Thyrotoxicosis with diffuse goiter without thyrotoxic crisis or storm: Secondary | ICD-10-CM | POA: Diagnosis not present

## 2021-10-11 DIAGNOSIS — M1711 Unilateral primary osteoarthritis, right knee: Secondary | ICD-10-CM | POA: Diagnosis not present

## 2021-10-11 DIAGNOSIS — E1159 Type 2 diabetes mellitus with other circulatory complications: Secondary | ICD-10-CM | POA: Diagnosis not present

## 2021-10-11 DIAGNOSIS — I484 Atypical atrial flutter: Secondary | ICD-10-CM | POA: Diagnosis not present

## 2021-10-11 DIAGNOSIS — E119 Type 2 diabetes mellitus without complications: Secondary | ICD-10-CM | POA: Diagnosis not present

## 2021-10-13 ENCOUNTER — Encounter (HOSPITAL_COMMUNITY): Payer: Self-pay

## 2021-10-13 ENCOUNTER — Telehealth: Payer: Self-pay | Admitting: Family Medicine

## 2021-10-13 ENCOUNTER — Emergency Department (HOSPITAL_COMMUNITY)
Admission: EM | Admit: 2021-10-13 | Discharge: 2021-10-13 | Disposition: A | Discharge status: Home / Self Care | Payer: Medicare Other | Attending: Emergency Medicine | Admitting: Emergency Medicine

## 2021-10-13 ENCOUNTER — Other Ambulatory Visit: Payer: Self-pay

## 2021-10-13 DIAGNOSIS — Z91040 Latex allergy status: Secondary | ICD-10-CM | POA: Insufficient documentation

## 2021-10-13 DIAGNOSIS — Z7901 Long term (current) use of anticoagulants: Secondary | ICD-10-CM | POA: Insufficient documentation

## 2021-10-13 DIAGNOSIS — Z5189 Encounter for other specified aftercare: Secondary | ICD-10-CM | POA: Diagnosis not present

## 2021-10-13 DIAGNOSIS — Z4800 Encounter for change or removal of nonsurgical wound dressing: Secondary | ICD-10-CM | POA: Insufficient documentation

## 2021-10-13 DIAGNOSIS — Z96651 Presence of right artificial knee joint: Secondary | ICD-10-CM | POA: Insufficient documentation

## 2021-10-13 DIAGNOSIS — R509 Fever, unspecified: Secondary | ICD-10-CM | POA: Diagnosis not present

## 2021-10-13 NOTE — Telephone Encounter (Signed)
Physical Therapist Amy called to make Dr. Diona Browner aware that pt was taken to [facility] . Stated pt BP was low and issues with medication #[phone] ? ? ?

## 2021-10-13 NOTE — ED Provider Notes (Signed)
?[place] DEPT ?Provider Note ? ? ?CSN: [id number] ?Arrival date & time: 10/13/21  1345 ? ?  ? ?History ? ?Chief Complaint  ?Patient presents with  ? Post-op Problem  ? ? ?[name] is a 76 y.o. female.  The patient presents from home due to her physical therapist having concerns about the patient staying home alone.  The patient is 3 days postop status post right total knee arthroplasty.  Physical therapy was there and learned that the patient's sister had left and would not be back for a few days.  Therapist had concerns about the patient being alone and being able to care for self.  The patient has no complaints about the knee.  Denies fever.  Denies shortness of breath.  Endorses pain at the knee. ? ?HPI ? ?  ? ?Home Medications ?Prior to Admission medications   ?Medication Sig Start Date End Date Taking? Authorizing Provider  ?acetaminophen (TYLENOL) 650 MG CR tablet Take 1,300 mg by mouth daily as needed for pain.    [provider]  ?Acetaminophen-Codeine 300-30 MG tablet Take 1 tablet by mouth every 6 (six) hours as needed for pain. ?Patient not taking: Reported on 08/15/2021 08/01/21   [name], MD  ?ADVAIR HFA 230-21 MCG/ACT inhaler INHALE 2 PUFFS BY MOUTH TWICE DAILY TO PREVENT COUGH OR WHEEZING. RINSE MOUTH AFTER USE 07/19/21   Kozlow, Donnamarie Poag, MD  ?albuterol (VENTOLIN HFA) 108 (90 Base) MCG/ACT inhaler INHALE 2 PUFFS BY MOUTH EVERY 6 HOURS AS NEEDED FOR WHEEZING OR SHORTNESS OF BREATH 12/19/20   Kozlow, Donnamarie Poag, MD  ?atorvastatin (LIPITOR) 10 MG tablet TAKE 1 TABLET(10 MG) BY MOUTH DAILY 05/14/21   Bedsole, Amy E, MD  ?Blood Glucose Monitoring Suppl (ONETOUCH VERIO) w/Device KIT Use to check blood sugar up to 2 times a day 08/16/20   [name], MD  ?cyclobenzaprine (FLEXERIL) 5 MG tablet Take 1-2 tablets (5-10 mg total) by mouth 3 (three) times daily as needed for muscle spasms. 08/10/21   Gareth Morgan, MD  ?dextromethorphan-guaiFENesin [facility] DM)  30-600 MG 12hr tablet Take 1 tablet by mouth 2 (two) times daily.    [provider]  ?diclofenac Sodium (VOLTAREN) 1 % GEL Apply 2 g topically 4 (four) times daily. 03/21/21   Bedsole, Amy E, MD  ?EPINEPHrine 0.3 mg/0.3 mL IJ SOAJ injection USE AS DIRECTED FOR LIFE THREATENING ALLERGIC REACTIONS 11/29/20   Kozlow, Donnamarie Poag, MD  ?famotidine (PEPCID) 40 MG tablet TAKE 1 TABLET(40 MG) BY MOUTH DAILY 07/25/21   Kozlow, Donnamarie Poag, MD  ?fluticasone (FLOVENT HFA) 220 MCG/ACT inhaler 2 puffs 2 times daily during Flare Up. 06/06/21   Kozlow, Donnamarie Poag, MD  ?furosemide (LASIX) 20 MG tablet Take 20 mg as needed for a weight gain of 3 pounds overnight or 5 pounds in one week,. 06/23/21   Croitoru, Mihai, MD  ?gabapentin (NEURONTIN) 600 MG tablet TAKE 1 TABLET BY MOUTH EVERY DAY AT BREAKFAST, 1 TABLET AT LUNCH AND 2 TABLETS AT BEDTIME 03/27/21   Hyatt, Max T, DPM  ?GLYXAMBI 10-5 MG TABS TAKE 1 TABLET BY MOUTH DAILY 09/10/21   [name], MD  ?Homeopathic Products [facility] RELIEF EX) Apply 1 spray topically daily as needed (muscle cramps).    [provider]  ?hydrOXYzine (ATARAX/VISTARIL) 10 MG tablet Take 1 tablet (10 mg total) by mouth daily as needed for itching. 04/18/21   Bedsole, Amy E, MD  ?ipratropium (ATROVENT) 0.06 % nasal spray USE 1 TO  2 SPRAYS IN EACH NOSTRIL 1 TO 2 TIMES PER DAY 09/26/21   Kozlow, Donnamarie Poag, MD  ?irbesartan (AVAPRO) 150 MG tablet Take 1 tablet (150 mg total) by mouth daily. 09/18/21   Croitoru, Mihai, MD  ?Lactase (DAIRY-RELIEF PO) Take 1 capsule by mouth daily as needed (eating dairy).     [provider]  ?Lancets (ONETOUCH DELICA PLUS [id number]) Lily USE TO CHECK BLOOD SUGAR UP TO TWICE DAILY 10/20/20   Bedsole, Amy E, MD  ?levalbuterol (XOPENEX) 1.25 MG/3ML nebulizer solution USE 1 VIAL VIA NEBULIZER EVERY 6 HOURS AS NEEDED FOR SHORTNESS OF BREATH OR WHEEZING 12/19/20   Kozlow, Donnamarie Poag, MD  ?liver oil-zinc oxide (DESITIN) 40 % ointment Apply 1 application topically as needed for  irritation.    [provider]  ?meclizine (ANTIVERT) 12.5 MG tablet Take 1 tablet (12.5 mg total) by mouth 3 (three) times daily as needed for dizziness. 04/18/21   Bedsole, Amy E, MD  ?Menthol-Methyl Salicylate (SALONPAS JET SPRAY EX) Apply 1 spray topically daily as needed (knee pain).    [provider]  ?methimazole (TAPAZOLE) 5 MG tablet Take 1 tablet (5 mg total) by mouth daily. 06/01/21   Philemon Kingdom, MD  ?montelukast (SINGULAIR) 10 MG tablet Take 1 tablet (10 mg total) by mouth at bedtime. For asthma control. 07/03/21   Kozlow, Donnamarie Poag, MD  ?Nutritional Supplements (NUTRITIONAL SUPPLEMENT PO) Take by mouth. Omega XL - Take 2 gel capsules 2 times a day    [provider]  ?nystatin (MYCOSTATIN) 100000 UNIT/ML suspension 20m swish and swallow after Advair 2 times daily. 06/06/21   Kozlow, EDonnamarie Poag MD  ?ondansetron (ZOFRAN-ODT) 4 MG disintegrating tablet Take 1 tablet (4 mg total) by mouth every 8 (eight) hours as needed for nausea or vomiting. 04/18/21   Bedsole, Amy E, MD  ?ONETOUCH VERIO test strip USE TO CHECK BLOOD SUGAR UP TO TWICE DAILY 10/20/20   Bedsole, Amy E, MD  ?oxybutynin (DITROPAN-XL) 10 MG 24 hr tablet Take 10 mg by mouth daily. 05/08/21   [provider]  ?pantoprazole (PROTONIX) 40 MG tablet TAKE 1 TABLET(40 MG) BY MOUTH DAILY 05/29/21   Kozlow, EDonnamarie Poag MD  ?PATADAY 0.2 % SOLN Place 1 drop into both eyes daily as needed (allergies).  01/27/17   [provider]  ?Polyethyl Glycol-Propyl Glycol (SYSTANE OP) Place 1 drop into both eyes in the morning and at bedtime.    [provider]  ?potassium chloride SA (KLOR-CON) 20 MEQ tablet Take 1 tablet (20 mEq total) by mouth as needed (Take one daily when you need to take a Furosemide). 12/22/20   Croitoru, Mihai, MD  ?SPIRIVA RESPIMAT 1.25 MCG/ACT AERS INHALE 2 PUFFS INTO THE LUNGS DAILY 07/19/21   Kozlow, EDonnamarie Poag MD  ?tolterodine (DETROL LA) 4 MG 24 hr capsule Take 4 mg by mouth daily.    [provider]  ?TRULANCE 3 MG TABS Take 3 mg by mouth every other day. 12/06/18   [provider]  ?XARELTO 20 MG TABS tablet TAKE 1 TABLET(20 MG) BY MOUTH DAILY WITH SUPPER 09/01/21   Croitoru, MDani Gobble MD  ?   ? ?Allergies    ?Latex, Oxycodone, Penicillins, Acetaminophen-codeine, Oxycodone-acetaminophen, Lyrica [pregabalin], Nickel, Phenytoin sodium extended, and Vicodin [hydrocodone-acetaminophen]   ? ?Review of Systems   ?Review of Systems  ?Respiratory:  Negative for shortness of breath.   ?Cardiovascular:  Negative for chest pain.  ?Gastrointestinal:  Negative for abdominal pain.  ?Musculoskeletal:  Positive for arthralgias.  ?  Right knee pain status post TKA  ? ?Physical Exam ?Updated Vital Signs ?BP 134/69   Pulse 98   Temp 98 ?F (36.7 ?C)   Resp 16   Ht _0  (1.626 m)   Wt 85.7 kg   SpO2 95%   BMI 32.44 kg/m?  ?Physical Exam ?Vitals and nursing note reviewed.  ?Constitutional:   ?   Appearance: Normal appearance.  ?HENT:  ?   Head: Normocephalic.  ?Eyes:  ?   Conjunctiva/sclera: Conjunctivae normal.  ?Cardiovascular:  ?   Rate and Rhythm: Normal rate and regular rhythm.  ?   Pulses: Normal pulses.  ?Pulmonary:  ?   Effort: Pulmonary effort is normal.  ?   Breath sounds: Normal breath sounds.  ?Abdominal:  ?   Palpations: Abdomen is soft.  ?Musculoskeletal:  ?   Comments: Bruising and minimal swelling noted around the right knee.  No calf tenderness.  No excess swelling.  Scant drainage noted inside Aquacel dressing.  Limb similar in warmth to contralateral limb.  Changes appear normal for 3 days postop  ?Neurological:  ?   Mental Status: She is alert.  ? ? ?ED Results / Procedures / Treatments   ?Labs ?(all labs ordered are listed, but only abnormal results are displayed) ?Labs Reviewed - No data to display ? ?EKG ?None ? ?Radiology ?No results found. ? ?Procedures ?Procedures  ? ? ?Medications Ordered in ED ?Medications - No data to display ? ?ED Course/ Medical Decision Making/ A&P ?  ?                         ?Medical Decision Making ? ?The patient presents 3 days postop status post right total knee arthroplasty with no complaints.  The patient's therapist called EMS because the patient's therapist was

## 2021-10-13 NOTE — Discharge Instructions (Signed)
As discussed, please have your friend check in on you over the weekend.  Call and follow-up with your surgeon.  Return to the emergency department if you develop life-threatening conditions ?

## 2021-10-13 NOTE — ED Triage Notes (Signed)
Per EMS, Pt, from home, c/o warm to R knee following a total replacement x 3 days ago.  Pain score 8/10.  Drainage noted on dressing.  Warmth noted to leg.    ?

## 2021-10-13 NOTE — Telephone Encounter (Signed)
Noted  

## 2021-10-16 ENCOUNTER — Encounter (HOSPITAL_BASED_OUTPATIENT_CLINIC_OR_DEPARTMENT_OTHER): Payer: Self-pay

## 2021-10-16 ENCOUNTER — Other Ambulatory Visit: Payer: Self-pay

## 2021-10-16 ENCOUNTER — Emergency Department (HOSPITAL_BASED_OUTPATIENT_CLINIC_OR_DEPARTMENT_OTHER)
Admission: EM | Admit: 2021-10-16 | Discharge: 2021-10-16 | Disposition: A | Discharge status: Home / Self Care | Payer: Medicare Other | Attending: Emergency Medicine | Admitting: Emergency Medicine

## 2021-10-16 ENCOUNTER — Emergency Department (HOSPITAL_BASED_OUTPATIENT_CLINIC_OR_DEPARTMENT_OTHER): Payer: Medicare Other | Admitting: Radiology

## 2021-10-16 DIAGNOSIS — L0390 Cellulitis, unspecified: Secondary | ICD-10-CM

## 2021-10-16 DIAGNOSIS — Z91040 Latex allergy status: Secondary | ICD-10-CM | POA: Insufficient documentation

## 2021-10-16 DIAGNOSIS — M7989 Other specified soft tissue disorders: Secondary | ICD-10-CM | POA: Diagnosis not present

## 2021-10-16 DIAGNOSIS — M25461 Effusion, right knee: Secondary | ICD-10-CM | POA: Insufficient documentation

## 2021-10-16 DIAGNOSIS — L539 Erythematous condition, unspecified: Secondary | ICD-10-CM | POA: Diagnosis not present

## 2021-10-16 DIAGNOSIS — J4550 Severe persistent asthma, uncomplicated: Secondary | ICD-10-CM | POA: Diagnosis not present

## 2021-10-16 DIAGNOSIS — Z7901 Long term (current) use of anticoagulants: Secondary | ICD-10-CM | POA: Diagnosis not present

## 2021-10-16 DIAGNOSIS — M25561 Pain in right knee: Secondary | ICD-10-CM | POA: Insufficient documentation

## 2021-10-16 DIAGNOSIS — L03115 Cellulitis of right lower limb: Secondary | ICD-10-CM | POA: Diagnosis not present

## 2021-10-16 LAB — CBC WITH DIFFERENTIAL/PLATELET
Abs Immature Granulocytes: 0.05 10*3/uL (ref 0.00–0.07)
Basophils Absolute: 0 10*3/uL (ref 0.0–0.1)
Basophils Relative: 0 %
Eosinophils Absolute: 0.3 10*3/uL (ref 0.0–0.5)
Eosinophils Relative: 3 %
HCT: 29.8 % — ABNORMAL LOW (ref 36.0–46.0)
Hemoglobin: 10.3 g/dL — ABNORMAL LOW (ref 12.0–15.0)
Immature Granulocytes: 1 %
Lymphocytes Relative: 19 %
Lymphs Abs: 1.8 10*3/uL (ref 0.7–4.0)
MCH: 27.7 pg (ref 26.0–34.0)
MCHC: 34.6 g/dL (ref 30.0–36.0)
MCV: 80.1 fL (ref 80.0–100.0)
Monocytes Absolute: 1 10*3/uL (ref 0.1–1.0)
Monocytes Relative: 11 %
Neutro Abs: 6.2 10*3/uL (ref 1.7–7.7)
Neutrophils Relative %: 66 %
Platelets: 290 10*3/uL (ref 150–400)
RBC: 3.72 MIL/uL — ABNORMAL LOW (ref 3.87–5.11)
RDW: 15.1 % (ref 11.5–15.5)
WBC: 9.4 10*3/uL (ref 4.0–10.5)
nRBC: 0 % (ref 0.0–0.2)

## 2021-10-16 LAB — BASIC METABOLIC PANEL
Anion gap: 9 (ref 5–15)
BUN: 16 mg/dL (ref 8–23)
CO2: 26 mmol/L (ref 22–32)
Calcium: 9.1 mg/dL (ref 8.9–10.3)
Chloride: 104 mmol/L (ref 98–111)
Creatinine, Ser: 0.95 mg/dL (ref 0.44–1.00)
GFR, Estimated: >60 mL/min (ref >60)
Glucose, Bld: 104 mg/dL — ABNORMAL HIGH (ref 70–99)
Potassium: 5 mmol/L (ref 3.5–5.1)
Sodium: 139 mmol/L (ref 135–145)

## 2021-10-16 MED ORDER — MORPHINE SULFATE (PF) 4 MG/ML IV SOLN
4.0000 mg | Freq: Once | INTRAVENOUS | Status: AC
  Administered 2021-10-16: 4 mg via INTRAVENOUS
  Filled 2021-10-16: qty 1

## 2021-10-16 MED ORDER — MORPHINE SULFATE (PF) 4 MG/ML IV SOLN: 6.0000 mg | Freq: Once | INTRAVENOUS | Status: DC

## 2021-10-16 MED ORDER — ONDANSETRON HCL 4 MG/2ML IJ SOLN
4.0000 mg | Freq: Once | INTRAMUSCULAR | Status: AC
  Administered 2021-10-16: 4 mg via INTRAVENOUS
  Filled 2021-10-16: qty 2

## 2021-10-16 MED ORDER — ONDANSETRON 4 MG PO TBDP: 8.0000 mg | ORAL_TABLET | Freq: Once | ORAL | Status: DC

## 2021-10-16 MED ORDER — OXYCODONE-ACETAMINOPHEN 5-325 MG PO TABS: 1.0000 | ORAL_TABLET | Freq: Four times a day (QID) | ORAL | 0 refills | Status: DC | PRN

## 2021-10-16 MED ORDER — ONDANSETRON 8 MG PO TBDP: 8.0000 mg | ORAL_TABLET | Freq: Three times a day (TID) | ORAL | 0 refills | Status: DC | PRN

## 2021-10-16 MED ORDER — DOXYCYCLINE HYCLATE 100 MG PO CAPS: 100.0000 mg | ORAL_CAPSULE | Freq: Two times a day (BID) | ORAL | 0 refills | Status: DC

## 2021-10-16 NOTE — ED Triage Notes (Signed)
Pt presents to the ED from home POV. Lives alone at home and has her neighbor stay with her when needed. Had a total knee replacement on 3/28. Is here for pain and drainage. Pain 10/10. Last took tylenol at 0330 today.  ?

## 2021-10-16 NOTE — ED Provider Notes (Signed)
? ? ? ?[facility] ?Provider Note ? ? ?CSN: [id number] ?Arrival date & time: 10/16/21  7035 ? ?  ? ?History ? ?Chief Complaint  ?Patient presents with  ? right knee pain  ? ? ?[name] is a 76 y.o. female. ? ?76 year old female presents with right knee pain and swelling.  Patient had knee surgery several days ago and since that time has had increasing erythema to the region.  Denies any fever or emesis.  Seen in the ED for postop check 3 days ago and was told to follow-up with her doctor.  Has noted some drainage from the wound.  Has noted increased leg edema as well to.  Patient describes the pain as sharp and worse with any movement. ? ? ?  ? ?Home Medications ?Prior to Admission medications   ?Medication Sig Start Date End Date Taking? Authorizing Provider  ?acetaminophen (TYLENOL) 650 MG CR tablet Take 1,300 mg by mouth daily as needed for pain.    [provider]  ?Acetaminophen-Codeine 300-30 MG tablet Take 1 tablet by mouth every 6 (six) hours as needed for pain. ?Patient not taking: Reported on 08/15/2021 08/01/21   [name], MD  ?ADVAIR HFA 230-21 MCG/ACT inhaler INHALE 2 PUFFS BY MOUTH TWICE DAILY TO PREVENT COUGH OR WHEEZING. RINSE MOUTH AFTER USE 07/19/21   Kozlow, Donnamarie Poag, MD  ?albuterol (VENTOLIN HFA) 108 (90 Base) MCG/ACT inhaler INHALE 2 PUFFS BY MOUTH EVERY 6 HOURS AS NEEDED FOR WHEEZING OR SHORTNESS OF BREATH 12/19/20   Kozlow, Donnamarie Poag, MD  ?atorvastatin (LIPITOR) 10 MG tablet TAKE 1 TABLET(10 MG) BY MOUTH DAILY 05/14/21   Bedsole, Amy E, MD  ?Blood Glucose Monitoring Suppl (ONETOUCH VERIO) w/Device KIT Use to check blood sugar up to 2 times a day 08/16/20   [name], MD  ?cyclobenzaprine (FLEXERIL) 5 MG tablet Take 1-2 tablets (5-10 mg total) by mouth 3 (three) times daily as needed for muscle spasms. 08/10/21   Gareth Morgan, MD  ?dextromethorphan-guaiFENesin [facility] DM) 30-600 MG 12hr tablet Take 1 tablet by mouth 2 (two) times daily.    [provider]  ?diclofenac Sodium (VOLTAREN) 1 % GEL Apply 2 g topically 4 (four) times daily. 03/21/21   Bedsole, Amy E, MD  ?EPINEPHrine 0.3 mg/0.3 mL IJ SOAJ injection USE AS DIRECTED FOR LIFE THREATENING ALLERGIC REACTIONS 11/29/20   Kozlow, Donnamarie Poag, MD  ?famotidine (PEPCID) 40 MG tablet TAKE 1 TABLET(40 MG) BY MOUTH DAILY 07/25/21   Kozlow, Donnamarie Poag, MD  ?fluticasone (FLOVENT HFA) 220 MCG/ACT inhaler 2 puffs 2 times daily during Flare Up. 06/06/21   Kozlow, Donnamarie Poag, MD  ?furosemide (LASIX) 20 MG tablet Take 20 mg as needed for a weight gain of 3 pounds overnight or 5 pounds in one week,. 06/23/21   Croitoru, Mihai, MD  ?gabapentin (NEURONTIN) 600 MG tablet TAKE 1 TABLET BY MOUTH EVERY DAY AT BREAKFAST, 1 TABLET AT LUNCH AND 2 TABLETS AT BEDTIME 03/27/21   Hyatt, Max T, DPM  ?GLYXAMBI 10-5 MG TABS TAKE 1 TABLET BY MOUTH DAILY 09/10/21   [name], MD  ?Homeopathic Products [facility] RELIEF EX) Apply 1 spray topically daily as needed (muscle cramps).    [provider]  ?hydrOXYzine (ATARAX/VISTARIL) 10 MG tablet Take 1 tablet (10 mg total) by mouth daily as needed for itching. 04/18/21   Bedsole, Amy E, MD  ?ipratropium (ATROVENT) 0.06 % nasal spray USE 1 TO 2 SPRAYS IN EACH NOSTRIL 1 TO 2  TIMES PER DAY 09/26/21   Kozlow, Donnamarie Poag, MD  ?irbesartan (AVAPRO) 150 MG tablet Take 1 tablet (150 mg total) by mouth daily. 09/18/21   Croitoru, Mihai, MD  ?Lactase (DAIRY-RELIEF PO) Take 1 capsule by mouth daily as needed (eating dairy).     [provider]  ?Lancets (ONETOUCH DELICA PLUS VUDTHY38O) [place] USE TO CHECK BLOOD SUGAR UP TO TWICE DAILY 10/20/20   Bedsole, Amy E, MD  ?levalbuterol (XOPENEX) 1.25 MG/3ML nebulizer solution USE 1 VIAL VIA NEBULIZER EVERY 6 HOURS AS NEEDED FOR SHORTNESS OF BREATH OR WHEEZING 12/19/20   Kozlow, Donnamarie Poag, MD  ?liver oil-zinc oxide (DESITIN) 40 % ointment Apply 1 application topically as needed for irritation.    [provider]  ?meclizine (ANTIVERT) 12.5 MG tablet Take 1  tablet (12.5 mg total) by mouth 3 (three) times daily as needed for dizziness. 04/18/21   Bedsole, Amy E, MD  ?Menthol-Methyl Salicylate (SALONPAS JET SPRAY EX) Apply 1 spray topically daily as needed (knee pain).    [provider]  ?methimazole (TAPAZOLE) 5 MG tablet Take 1 tablet (5 mg total) by mouth daily. 06/01/21   Philemon Kingdom, MD  ?montelukast (SINGULAIR) 10 MG tablet Take 1 tablet (10 mg total) by mouth at bedtime. For asthma control. 07/03/21   Kozlow, Donnamarie Poag, MD  ?Nutritional Supplements (NUTRITIONAL SUPPLEMENT PO) Take by mouth. Omega XL - Take 2 gel capsules 2 times a day    [provider]  ?nystatin (MYCOSTATIN) 100000 UNIT/ML suspension 36m swish and swallow after Advair 2 times daily. 06/06/21   Kozlow, EDonnamarie Poag MD  ?ondansetron (ZOFRAN-ODT) 4 MG disintegrating tablet Take 1 tablet (4 mg total) by mouth every 8 (eight) hours as needed for nausea or vomiting. 04/18/21   Bedsole, Amy E, MD  ?ONETOUCH VERIO test strip USE TO CHECK BLOOD SUGAR UP TO TWICE DAILY 10/20/20   Bedsole, Amy E, MD  ?oxybutynin (DITROPAN-XL) 10 MG 24 hr tablet Take 10 mg by mouth daily. 05/08/21   [provider]  ?pantoprazole (PROTONIX) 40 MG tablet TAKE 1 TABLET(40 MG) BY MOUTH DAILY 05/29/21   Kozlow, EDonnamarie Poag MD  ?PATADAY 0.2 % SOLN Place 1 drop into both eyes daily as needed (allergies).  01/27/17   [provider]  ?Polyethyl Glycol-Propyl Glycol (SYSTANE OP) Place 1 drop into both eyes in the morning and at bedtime.    [provider]  ?potassium chloride SA (KLOR-CON) 20 MEQ tablet Take 1 tablet (20 mEq total) by mouth as needed (Take one daily when you need to take a Furosemide). 12/22/20   Croitoru, Mihai, MD  ?SPIRIVA RESPIMAT 1.25 MCG/ACT AERS INHALE 2 PUFFS INTO THE LUNGS DAILY 07/19/21   Kozlow, EDonnamarie Poag MD  ?tolterodine (DETROL LA) 4 MG 24 hr capsule Take 4 mg by mouth daily.    [provider]  ?TRULANCE 3 MG TABS Take 3 mg by mouth every other day. 12/06/18    [provider]  ?XARELTO 20 MG TABS tablet TAKE 1 TABLET(20 MG) BY MOUTH DAILY WITH SUPPER 09/01/21   Croitoru, MDani Gobble MD  ?   ? ?Allergies    ?Latex, Oxycodone, Penicillins, Acetaminophen-codeine, Oxycodone-acetaminophen, Lyrica [pregabalin], Nickel, Phenytoin sodium extended, and Vicodin [hydrocodone-acetaminophen]   ? ?Review of Systems   ?Review of Systems  ?All other systems reviewed and are negative. ? ?Physical Exam ?Updated Vital Signs ?BP 130/76 (BP Location: Right Arm)   Pulse 71   Temp 98.4 ?F (36.9 ?C) (Oral)   Resp 20  Ht 1.626 m (5' 4")   Wt 85.7 kg   SpO2 94%   BMI 32.44 kg/m?  ?Physical Exam ?Vitals and nursing note reviewed.  ?Constitutional:   ?   General: She is not in acute distress. ?   Appearance: Normal appearance. She is well-developed. She is not toxic-appearing.  ?HENT:  ?   Head: Normocephalic and atraumatic.  ?Eyes:  ?   General: Lids are normal.  ?   Conjunctiva/sclera: Conjunctivae normal.  ?   Pupils: Pupils are equal, round, and reactive to light.  ?Neck:  ?   Thyroid: No thyroid mass.  ?   Trachea: No tracheal deviation.  ?Cardiovascular:  ?   Rate and Rhythm: Normal rate and regular rhythm.  ?   Heart sounds: Normal heart sounds. No murmur heard. ?  No gallop.  ?Pulmonary:  ?   Effort: Pulmonary effort is normal. No respiratory distress.  ?   Breath sounds: Normal breath sounds. No stridor. No decreased breath sounds, wheezing, rhonchi or rales.  ?Abdominal:  ?   General: There is no distension.  ?   Palpations: Abdomen is soft.  ?   Tenderness: There is no abdominal tenderness. There is no rebound.  ?Musculoskeletal:     ?   General: No tenderness. Normal range of motion.  ?   Cervical back: Normal range of motion and neck supple.  ?     Legs: ? ?Skin: ?   General: Skin is warm and dry.  ?   Findings: No abrasion or rash.  ?Neurological:  ?   Mental Status: She is alert and oriented to person, place, and time. Mental status is at baseline.  ?   GCS: GCS eye  subscore is 4. GCS verbal subscore is 5. GCS motor subscore is 6.  ?   Cranial Nerves: No cranial nerve deficit.  ?   Sensory: No sensory deficit.  ?   Motor: Motor function is intact.  ?Psychiatric:     ?   Attention and

## 2021-10-16 NOTE — Discharge Instructions (Addendum)
Call your surgeon today to schedule a follow-up visit this week ?

## 2021-10-16 NOTE — ED Notes (Signed)
[facility] called and left a message with DR Hallows care team [phone] 9:33 AOM ?

## 2021-10-17 ENCOUNTER — Telehealth: Payer: Self-pay | Admitting: Family Medicine

## 2021-10-17 ENCOUNTER — Ambulatory Visit (INDEPENDENT_AMBULATORY_CARE_PROVIDER_SITE_OTHER): Payer: Medicare Other

## 2021-10-17 DIAGNOSIS — J4550 Severe persistent asthma, uncomplicated: Secondary | ICD-10-CM | POA: Diagnosis not present

## 2021-10-17 NOTE — Telephone Encounter (Signed)
Noted  

## 2021-10-17 NOTE — Telephone Encounter (Signed)
Received orders to go out for PT, patient refused on 4.3.23 in too much pain, ? ?Patient wants [facility] to call tomorrow 4.5.23 to see how she is feeling  ? ?So late start ? ?[name] ?[phone] ?

## 2021-10-19 DIAGNOSIS — J4540 Moderate persistent asthma, uncomplicated: Secondary | ICD-10-CM | POA: Diagnosis not present

## 2021-10-19 DIAGNOSIS — E1169 Type 2 diabetes mellitus with other specified complication: Secondary | ICD-10-CM | POA: Diagnosis not present

## 2021-10-19 DIAGNOSIS — I152 Hypertension secondary to endocrine disorders: Secondary | ICD-10-CM | POA: Diagnosis not present

## 2021-10-19 DIAGNOSIS — I4891 Unspecified atrial fibrillation: Secondary | ICD-10-CM | POA: Diagnosis not present

## 2021-10-19 DIAGNOSIS — Z86718 Personal history of other venous thrombosis and embolism: Secondary | ICD-10-CM | POA: Diagnosis not present

## 2021-10-19 DIAGNOSIS — Z7901 Long term (current) use of anticoagulants: Secondary | ICD-10-CM | POA: Diagnosis not present

## 2021-10-19 DIAGNOSIS — E785 Hyperlipidemia, unspecified: Secondary | ICD-10-CM | POA: Diagnosis not present

## 2021-10-19 DIAGNOSIS — I484 Atypical atrial flutter: Secondary | ICD-10-CM | POA: Diagnosis not present

## 2021-10-19 DIAGNOSIS — Z86711 Personal history of pulmonary embolism: Secondary | ICD-10-CM | POA: Diagnosis not present

## 2021-10-19 DIAGNOSIS — Z471 Aftercare following joint replacement surgery: Secondary | ICD-10-CM | POA: Diagnosis not present

## 2021-10-19 DIAGNOSIS — E1159 Type 2 diabetes mellitus with other circulatory complications: Secondary | ICD-10-CM | POA: Diagnosis not present

## 2021-10-19 DIAGNOSIS — I272 Pulmonary hypertension, unspecified: Secondary | ICD-10-CM | POA: Diagnosis not present

## 2021-10-19 DIAGNOSIS — Z008 Encounter for other general examination: Secondary | ICD-10-CM | POA: Diagnosis not present

## 2021-10-19 DIAGNOSIS — E1140 Type 2 diabetes mellitus with diabetic neuropathy, unspecified: Secondary | ICD-10-CM | POA: Diagnosis not present

## 2021-10-19 DIAGNOSIS — I5032 Chronic diastolic (congestive) heart failure: Secondary | ICD-10-CM | POA: Diagnosis not present

## 2021-10-19 DIAGNOSIS — I081 Rheumatic disorders of both mitral and tricuspid valves: Secondary | ICD-10-CM | POA: Diagnosis not present

## 2021-10-19 DIAGNOSIS — Z9181 History of falling: Secondary | ICD-10-CM | POA: Diagnosis not present

## 2021-10-19 DIAGNOSIS — Z7984 Long term (current) use of oral hypoglycemic drugs: Secondary | ICD-10-CM | POA: Diagnosis not present

## 2021-10-19 DIAGNOSIS — F325 Major depressive disorder, single episode, in full remission: Secondary | ICD-10-CM | POA: Diagnosis not present

## 2021-10-19 DIAGNOSIS — Z96651 Presence of right artificial knee joint: Secondary | ICD-10-CM | POA: Diagnosis not present

## 2021-10-19 DIAGNOSIS — Z7951 Long term (current) use of inhaled steroids: Secondary | ICD-10-CM | POA: Diagnosis not present

## 2021-10-19 DIAGNOSIS — Z713 Dietary counseling and surveillance: Secondary | ICD-10-CM | POA: Diagnosis not present

## 2021-10-23 DIAGNOSIS — Z471 Aftercare following joint replacement surgery: Secondary | ICD-10-CM | POA: Diagnosis not present

## 2021-10-23 DIAGNOSIS — E1159 Type 2 diabetes mellitus with other circulatory complications: Secondary | ICD-10-CM | POA: Diagnosis not present

## 2021-10-23 DIAGNOSIS — Z96651 Presence of right artificial knee joint: Secondary | ICD-10-CM | POA: Diagnosis not present

## 2021-10-23 DIAGNOSIS — I152 Hypertension secondary to endocrine disorders: Secondary | ICD-10-CM | POA: Diagnosis not present

## 2021-10-23 DIAGNOSIS — I272 Pulmonary hypertension, unspecified: Secondary | ICD-10-CM | POA: Diagnosis not present

## 2021-10-23 DIAGNOSIS — J4540 Moderate persistent asthma, uncomplicated: Secondary | ICD-10-CM | POA: Diagnosis not present

## 2021-10-26 ENCOUNTER — Ambulatory Visit: Payer: Medicare Other | Admitting: Family Medicine

## 2021-10-27 DIAGNOSIS — Z96651 Presence of right artificial knee joint: Secondary | ICD-10-CM | POA: Diagnosis not present

## 2021-10-27 DIAGNOSIS — Z471 Aftercare following joint replacement surgery: Secondary | ICD-10-CM | POA: Diagnosis not present

## 2021-10-27 DIAGNOSIS — E1159 Type 2 diabetes mellitus with other circulatory complications: Secondary | ICD-10-CM | POA: Diagnosis not present

## 2021-10-27 DIAGNOSIS — J4540 Moderate persistent asthma, uncomplicated: Secondary | ICD-10-CM | POA: Diagnosis not present

## 2021-10-27 DIAGNOSIS — I152 Hypertension secondary to endocrine disorders: Secondary | ICD-10-CM | POA: Diagnosis not present

## 2021-10-27 DIAGNOSIS — I272 Pulmonary hypertension, unspecified: Secondary | ICD-10-CM | POA: Diagnosis not present

## 2021-10-30 DIAGNOSIS — Z96651 Presence of right artificial knee joint: Secondary | ICD-10-CM | POA: Insufficient documentation

## 2021-10-30 DIAGNOSIS — Z471 Aftercare following joint replacement surgery: Secondary | ICD-10-CM | POA: Insufficient documentation

## 2021-10-31 ENCOUNTER — Encounter: Payer: Self-pay | Admitting: Podiatry

## 2021-10-31 ENCOUNTER — Ambulatory Visit (INDEPENDENT_AMBULATORY_CARE_PROVIDER_SITE_OTHER): Payer: Medicare Other | Admitting: Internal Medicine

## 2021-10-31 ENCOUNTER — Ambulatory Visit (INDEPENDENT_AMBULATORY_CARE_PROVIDER_SITE_OTHER): Payer: Medicare Other | Admitting: Podiatry

## 2021-10-31 ENCOUNTER — Encounter: Payer: Self-pay | Admitting: Internal Medicine

## 2021-10-31 ENCOUNTER — Ambulatory Visit (INDEPENDENT_AMBULATORY_CARE_PROVIDER_SITE_OTHER): Payer: Medicare Other

## 2021-10-31 VITALS — BP 110/72 | HR 114 | Ht 64.0 in | Wt 179.0 lb

## 2021-10-31 DIAGNOSIS — M7752 Other enthesopathy of left foot: Secondary | ICD-10-CM

## 2021-10-31 DIAGNOSIS — M79676 Pain in unspecified toe(s): Secondary | ICD-10-CM

## 2021-10-31 DIAGNOSIS — Q828 Other specified congenital malformations of skin: Secondary | ICD-10-CM

## 2021-10-31 DIAGNOSIS — E041 Nontoxic single thyroid nodule: Secondary | ICD-10-CM

## 2021-10-31 DIAGNOSIS — M779 Enthesopathy, unspecified: Secondary | ICD-10-CM | POA: Diagnosis not present

## 2021-10-31 DIAGNOSIS — E0500 Thyrotoxicosis with diffuse goiter without thyrotoxic crisis or storm: Secondary | ICD-10-CM

## 2021-10-31 DIAGNOSIS — M79672 Pain in left foot: Secondary | ICD-10-CM | POA: Diagnosis not present

## 2021-10-31 DIAGNOSIS — E1142 Type 2 diabetes mellitus with diabetic polyneuropathy: Secondary | ICD-10-CM

## 2021-10-31 DIAGNOSIS — B351 Tinea unguium: Secondary | ICD-10-CM | POA: Diagnosis not present

## 2021-10-31 LAB — T4, FREE: Free T4: 1.39 ng/dL (ref 0.60–1.60)

## 2021-10-31 LAB — TSH: TSH: 0.64 u[IU]/mL (ref 0.35–5.50)

## 2021-10-31 LAB — T3, FREE: T3, Free: 2.7 pg/mL (ref 2.3–4.2)

## 2021-10-31 MED ORDER — DOXYCYCLINE HYCLATE 100 MG PO CAPS: 100.0000 mg | ORAL_CAPSULE | Freq: Two times a day (BID) | ORAL | 0 refills | Status: DC

## 2021-10-31 NOTE — Patient Instructions (Signed)
For now, continue on Methimazole 5 mg daily. ? ?Please stop at the lab. ? ?Please come back for a follow-up appointment in 6 months. ? ?

## 2021-10-31 NOTE — Progress Notes (Signed)
Patient ID: [name], female   DOB: 1946/05/20, 76 y.o.   MRN: [id number]  ? ?This visit occurred during the SARS-CoV-2 public health emergency.  Safety protocols were in place, including screening questions prior to the visit, additional usage of staff PPE, and extensive cleaning of exam room while observing appropriate contact time as indicated for disinfecting solutions.  ? ?HPI  ?[name] is a 76 y.o.-year-old female, returning for f/u for for thyrotoxicosis and thyroid nodule.  She previously saw another endocrinologist (Dr. Wilson Singer), but only for her diabetes. She is the sister of [name], also my pt. ?Last OV with me 6 months ago. ? ?Interim history: ?She denies tremors, palpitations, heat intolerance, but continues to have some anxiety and also weight loss.  However, upon questioning, this was intentional, and she wanted to offload the right knee before the surgery.  She had TKR at the end of last month.  Her staples came out today.  She has a lot of pain and swelling now.  She also has pain in left foot.  She walks with a walker. ?She fell in 07/2021 >> concussion.  This resolved. ? ?Reviewed and addended history: ?Patient was found to be thyrotoxic in 08/2015 but she reports that she was only informed about this in 2018.  Thyroid scan indicated possible Graves' disease and also a cold left thyroid nodule.   ? ?At that time, she was recommended surgery but she did not want to proceed with this and started to see me.   ? ?She was started on methimazole initially 10 mg daily.  Doses were changed afterwards: ? ?Most recently: ?In 09/2018 we decreased the dose of methimazole to 2.5 mg daily.   ?In 04/2020, we stopped methimazole ?In 05/2020, we restarted methimazole 5 mg daily (she misunderstood instructions to only start 2.5 mg daily) ?In 10/2020, we decreased the methimazole to 2.5 mg daily ?In 12/2020, we decreased methimazole to 2.5 mg every other day ?In 01/2021, we stopped methimazole ?In  05/2021, we restarted methimazole 5 mg daily ?In 06/2021, we continued the same dose of methimazole ? ?She also continues on diltiazem CD. ? ?Her left thyroid nodule was biopsied with benign results in 2019.   ? ?Reviewed her TFTs: ?09/29/2021: TSH 5.81 (0.34-5.66) ?Lab Results  ?Component Value Date  ? TSH 0.71 06/27/2021  ? TSH 0.13 (L) 05/30/2021  ? TSH 0.25 (L) 05/02/2021  ? TSH 1.23 01/18/2021  ? TSH 4.07 12/15/2020  ? TSH 3.16 11/29/2020  ? TSH 4.38 10/18/2020  ? TSH 2.27 07/21/2020  ? TSH 0.08 (L) 06/14/2020  ? TSH 3.82 04/26/2020  ? FREET4 0.99 06/27/2021  ? FREET4 1.49 05/30/2021  ? FREET4 1.09 05/02/2021  ? FREET4 1.03 01/18/2021  ? FREET4 1.11 12/15/2020  ? FREET4 1.05 11/29/2020  ? FREET4 0.90 10/18/2020  ? FREET4 1.06 07/21/2020  ? FREET4 1.31 06/14/2020  ? FREET4 0.97 04/26/2020  ? T3FREE 3.3 06/27/2021  ? T3FREE 3.0 05/30/2021  ? T3FREE 2.6 05/02/2021  ? T3FREE 3.0 01/18/2021  ? T3FREE 2.6 12/15/2020  ? T3FREE 2.7 11/29/2020  ? T3FREE 2.3 10/18/2020  ? T3FREE 2.5 07/21/2020  ? T3FREE 3.8 06/14/2020  ? T3FREE 3.1 04/26/2020  ? ?Thyroid uptake and scan (10/31/2017): Increased uptake of 40%, and cold left inferior pole nodule. ? ?Thyroid U/S (11/12/2017): 3.2 cm left inferior TR 3 nodule correlates with the cold nodule by ?nuclear medicine scan. This nodule meets criteria for biopsy as above. ? ?FNA of her left thyroid  nodule (12/04/2017): Benign ? ?Thyroid U/S (10/23/2019): Slightly enlarged gland but not due to enlargement of the thyroid nodule. It is possible that this is due to more inflammation. ?Parenchymal Echotexture: Mildly heterogenous ?Isthmus: Normal in size measuring 0.6 cm in diameter, unchanged ?Right lobe: Normal in size measuring 5.3 x 1.4 x 1.6 cm, previously, 4.1 x 1.2 x 1.5 cm ?Left lobe: Enlarged measuring 6.9 x 3.9 x 2.4 cm, previously, 6.1 x 2.1 x 2.3 cm. ?___________________________________________________________ ?  ?The previously biopsied approximately 3.0 x 2.7 x 1.9 cm  isoechoic ?ill-defined nodule within the mid/inferior aspect of the left lobe ?of the thyroid is grossly unchanged in size compared to the 10/2017 ?examination, previously, 3.2 x 2.5 x 2.0 cm. Correlation with ?previous biopsy results is advised. ?  ?IMPRESSION: ?1. Similar appearing mildly heterogeneous and borderline enlarged ?thyroid gland without worrisome new or enlarging thyroid nodule. ?2. Previously biopsied nodule within the mid/inferior aspect the ?left lobe of the thyroid is grossly unchanged compared to the ?10/2017 examination. Correlation with previous biopsy results is ?advised. Assuming a benign pathologic diagnosis, repeat sampling ?and/or continued dedicated follow-up is not recommend ? ?Her Graves' antibodies were elevated: ?Lab Results  ?Component Value Date  ? TSI 485 (H) 05/30/2021  ? TSI 376 (H) 04/26/2020  ? TSI 331 (H) 11/03/2019  ? TSI 395 (H) 04/06/2019  ? TSI 361 (H) 02/06/2018  ? ?Pt denies: ?- feeling nodules in neck ?- hoarseness ?- dysphagia ?- SOB with lying down ?Rarely choking, with drier foods. ? ?No FH of thyroid disease or cancer. No h/o radiation tx to head or neck. ?No current biotin use. No recent steroids use.   ? ?She also has a history of type 2 diabetes - well controlled - managed by PCP. ?Lab Results  ?Component Value Date  ? HGBA1C 5.7 04/27/2021  ? ?She has asthma, on 2 inhalers. Sees Dr. Neldon Mc. ?She had cardioversion on 11/23/2020 - A. Fib with SOB.  ? ?ROS:  ?+ See HPI ? ?I reviewed pt's medications, allergies, PMH, social hx, family hx, and changes were documented in the history of present illness. Otherwise, unchanged from my initial visit note. ? ?Past Medical History:  ?Diagnosis Date  ? Allergic rhinitis   ? Allergy   ? Arthritis   ? Asthma   ? Chronic headache   ? Diabetes mellitus   ? DVT (deep venous thrombosis) ([place])   ? Dyspnea   ? Heart murmur   ? Hypertension   ? Penicillin allergy 01/19/2020  ? Pulmonary embolism ([place])   ? Pulmonary hypertension ([place])   ?  Seizures ([place])   ? ?Past Surgical History:  ?Procedure Laterality Date  ? ABDOMINAL HYSTERECTOMY    ? partial, has ovaries  ? CARDIAC CATHETERIZATION  12/28/2010  ? Mod. pulmonary hypertension, normal coronary arteries  ? CARDIOVERSION N/A 11/23/2020  ? Procedure: CARDIOVERSION;  Surgeon: Sanda Klein, MD;  Location: [facility];  Service: Cardiovascular;  Laterality: N/A;  ? DOPPLER ECHOCARDIOGRAPHY  10/08/2011  ? EF=>55%,mild asymmetric LVH, mod. TR, mod. PH, mild to mod LA dilatation  ? IR LUMBAR DISC ASPIRATION W/IMG GUIDE  01/12/2020  ? KNEE ARTHROSCOPY Left   ? KNEE SURGERY    ? Nuclear Stress Test  05/20/2006  ? No ischemia  ? PARTIAL HYSTERECTOMY    ? PLANTAR FASCIA SURGERY    ? TONSILLECTOMY    ? ?Social History  ? ?Socioeconomic History  ? Marital status: Widowed  ?  Spouse name: Not on file  ? Number  of children: Y  ? Years of education: Not on file  ? Highest education level: Not on file  ?Occupational History  ? Occupation: retired Geologist, engineering.   ?Tobacco Use  ? Smoking status: Never  ? Smokeless tobacco: Never  ?Vaping Use  ? Vaping Use: Never used  ?Substance and Sexual Activity  ? Alcohol use: Not Currently  ?  Alcohol/week: 0.0 standard drinks  ?  Comment: occ glass on wine  ? Drug use: No  ? Sexual activity: Never  ?Other Topics Concern  ? Not on file  ?Social History Narrative  ? Widow  ?  limited exercise.  ? ?Social Determinants of Health  ? ?Financial Resource Strain: Low Risk   ? Difficulty of Paying Living Expenses: Not very hard  ?Food Insecurity: Not on file  ?Transportation Needs: Not on file  ?Physical Activity: Not on file  ?Stress: Not on file  ?Social Connections: Not on file  ?Intimate Partner Violence: Not on file  ? ?Current Outpatient Medications on File Prior to Visit  ?Medication Sig Dispense Refill  ? acetaminophen (TYLENOL) 650 MG CR tablet Take 1,300 mg by mouth daily as needed for pain.    ? Acetaminophen-Codeine 300-30 MG tablet Take 1 tablet by mouth every 6 (six) hours  as needed for pain. (Patient not taking: Reported on 08/15/2021) 30 tablet 0  ? ADVAIR HFA 628-36 MCG/ACT inhaler INHALE 2 PUFFS BY MOUTH TWICE DAILY TO PREVENT COUGH OR WHEEZING. RINSE MOUTH AFTER USE 12 g 5  ?

## 2021-10-31 NOTE — Progress Notes (Signed)
?Subjective:  ?Patient ID: [name], female    DOB: Jul 25, 1945,  MRN: [id number] ? ?[name] presents to clinic today for at risk foot care with history of diabetic neuropathy and painful porokeratotic lesion(s) bilaterally and painful mycotic toenails that limit ambulation. Painful toenails interfere with ambulation. Aggravating factors include wearing enclosed shoe gear. Pain is relieved with periodic professional debridement. Painful porokeratotic lesions are aggravated when weightbearing with and without shoegear. Pain is relieved with periodic professional debridement.  Patient states she did have flexor tenotomy performed by Dr. Milinda Pointer and distal tip lesion has resolved. ? ?Last HgA1c was 5.6%. Patient did not check blood glucose today. ? ?Patient is recovering right total knee arthroplasty (DOS 10/10/2021). She did have postoperative cellulitis treated with a course of doxycycline. She is using a walker on today's visit ? ?New problem(s): Patient presents today with left foot pain x 1.5 weeks. She points to left 5th metatarsal base which is tender to touch. She denies any preceding trauma. She has attempted no treatment. She is unable to wear her diabetic shoe and is wearing a house slipper on the left foot. ? ?She is accompanied by her caregiver on today's visit. ? ?PCP is [name], MD , and last visit was August 15, 2021. ? ?Allergies  ?Allergen Reactions  ? Latex Hives  ? Oxycodone Nausea Only and Nausea And Vomiting  ?  Other reaction(s): Abdominal Pain, Vomiting  ? Penicillins Swelling  ?  Has patient had a PCN reaction causing immediate rash, facial/tongue/throat swelling, SOB or lightheadedness with hypotension ?patient had a PCN reaction causing severe rash involving mucus membranes or skin necrosis: [id number]} ?Has patient had a PCN reaction that required hospitalization/No ?Has patient had a PCN reaction occurring within the last 10 years: No ?If all of the above answers are  "NO", then may proceed with Cephalosporin use. ? ? ?  ? Acetaminophen-Codeine Nausea Only  ?  Other reaction(s): Vomiting  ? Oxycodone-Acetaminophen Nausea And Vomiting  ? Lyrica [Pregabalin] Other (See Comments)  ?  Blurred vision.  ? Nickel Rash  ? Phenytoin Sodium Extended Swelling  ? Vicodin [Hydrocodone-Acetaminophen] Nausea And Vomiting  ? ? ?Review of Systems: Negative except as noted in the HPI. ? ?Objective: No changes noted in today's physical examination. ? ?General: Patient is a pleasant 76 y.o. African American female WD, WN in NAD. AAO x 3.  ? ?Neurovascular Examination: ?CFT <3 seconds b/l LE. Palpable DP/PT pulses b/l LE. Digital hair sparse b/l. Skin temperature gradient WNL b/l. No pain with calf compression b/l. No edema noted b/l. No cyanosis or clubbing noted b/l LE. ? ?Pt has subjective symptoms of neuropathy. Protective sensation decreased with 10 gram monofilament b/l. ? ?Dermatological:  ?Pedal integument with normal turgor, texture and tone BLE. No open wounds b/l LE. No interdigital macerations noted b/l LE. Toenails 2-5 bilaterally elongated, discolored, dystrophic, thickened, and crumbly with subungual debris and tenderness to dorsal palpation. Anonychia noted bilateral great toes. Nailbed(s) epithelialized.  Porokeratotic lesion(s) R hallux, submet head 5 b/l and sub 5th met base right foot. No erythema, no edema, no drainage, no fluctuance. Preulcerative lesion L 2nd toe resolved.  ? ? ?Musculoskeletal:  ?Normal muscle strength 5/5 to all lower extremity muscle groups bilaterally. Hammertoe deformity noted 2-5 b/l. Pain on palpation lateral aspect left 5th metatarsal base at insertion of PB tendon. There is edema and mild ecchymosis. No warmth and no fluctuance to indicate underlying abscess.  Patient ambulates with walker assistance. ? ?Xray  findings left foot: ?No gas in tissues left foot. ?Soft tissue swelling present 5th met base left foot. ?Evidence of 5th metatarsal head with pin  fixation which appears to be stable.She has stable screw fixation at proximal diaphyseal metaphyseal junction of the 5ht metatarsal (old fx 5th/4th metatarsals?). There is a bony bridge to the 4th metatarsal shaft. ?No evidence of fracture left foot. ?-Severe pes planus foot deformity.  ? ? ?  Latest Ref Rng & Units 04/27/2021  ? 12:39 PM  ?Hemoglobin A1C  ?Hemoglobin-A1c 4.6 - 6.5 % 5.7    ? ?Assessment/Plan: ?1. Pain due to onychomycosis of toenail   ?2. Porokeratosis   ?3. Tendonitis   ?4. Bursitis of left foot   ?5. Pain in left foot   ?6. Diabetic polyneuropathy associated with type 2 diabetes mellitus ([place])   ?  ?-Patient was evaluated and treated. All patient's and/or POA's questions/concerns answered on today's visit. ?-Examined patient. ?-Preulcerative lesion distal tip left 2nd digit resolved s/p flexor tenotomy. ?-Mycotic toenails 1-5 bilaterally were debrided in length and girth with sterile nail nippers and dremel without incident. ?-Painful porokeratotic lesion(s) R hallux, submet head 5 b/l, and sub 5th met base right foot pared and enucleated with sterile scalpel blade without incident. Total number of lesions debrided=4. ?-Xray of left foot was performed and reviewed with patient and/or POA. ?-Rx for Doxycyline 100 mg, #20, to be taken one capsule twice daily for 10 days. ?-Advised patient to apply Voltaren Gel to left foot. Will have her see Dr. Milinda Pointer for further work up. She prefers to see him in [place] office. Continue house slipper for now due to exquisite tenderness to lateral aspect of foot. ?-Patient/POA to call should there be question/concern in the interim.  ? ?Return in about 3 months (around 01/30/2022). ? ?Marzetta Board, DPM  ?

## 2021-11-01 ENCOUNTER — Encounter: Payer: Self-pay | Admitting: Podiatry

## 2021-11-01 ENCOUNTER — Ambulatory Visit (INDEPENDENT_AMBULATORY_CARE_PROVIDER_SITE_OTHER): Payer: Medicare Other | Admitting: Podiatry

## 2021-11-01 DIAGNOSIS — M7752 Other enthesopathy of left foot: Secondary | ICD-10-CM

## 2021-11-01 MED ORDER — TRIAMCINOLONE ACETONIDE 40 MG/ML IJ SUSP
20.0000 mg | Freq: Once | INTRAMUSCULAR | Status: AC
  Administered 2021-11-01: 20 mg

## 2021-11-01 NOTE — Progress Notes (Signed)
She presents today for follow-up of pain to her fifth metatarsal base.  She saw Dr. Adah Perl yesterday x-rays were taken and were negative.  She states that she does not really know why her left foot is hurting her over here as she points to the fifth metatarsal and less it is due to compensation from her right knee surgery. ? ?Objective: Signs are stable she is alert and oriented x3 she has erythema and some mild edema overlying the fifth metatarsal base.  It is painful on the bottom with fluctuance.  This is consistent with bursitis.  I reexamined the x-rays today not demonstrating any type of abscess or gas in the tissues no signs of infection. ? ?Assessment: Bursitis subfifth met base left. ? ?Plan: I injected the area with an 5 mg of Marcaine and 5 mg of Kenalog.  She tolerated procedure well I will follow-up with her in just a few weeks. ?

## 2021-11-02 DIAGNOSIS — E1159 Type 2 diabetes mellitus with other circulatory complications: Secondary | ICD-10-CM | POA: Diagnosis not present

## 2021-11-02 DIAGNOSIS — Z471 Aftercare following joint replacement surgery: Secondary | ICD-10-CM | POA: Diagnosis not present

## 2021-11-02 DIAGNOSIS — Z96651 Presence of right artificial knee joint: Secondary | ICD-10-CM | POA: Diagnosis not present

## 2021-11-02 DIAGNOSIS — I152 Hypertension secondary to endocrine disorders: Secondary | ICD-10-CM | POA: Diagnosis not present

## 2021-11-02 DIAGNOSIS — J4540 Moderate persistent asthma, uncomplicated: Secondary | ICD-10-CM | POA: Diagnosis not present

## 2021-11-02 DIAGNOSIS — I272 Pulmonary hypertension, unspecified: Secondary | ICD-10-CM | POA: Diagnosis not present

## 2021-11-03 ENCOUNTER — Encounter: Payer: Self-pay | Admitting: Family Medicine

## 2021-11-03 ENCOUNTER — Ambulatory Visit (INDEPENDENT_AMBULATORY_CARE_PROVIDER_SITE_OTHER): Payer: Medicare Other | Admitting: Family Medicine

## 2021-11-03 ENCOUNTER — Ambulatory Visit: Payer: Medicare Other | Admitting: Family Medicine

## 2021-11-03 VITALS — BP 134/64 | HR 97 | Temp 98.5°F | Ht 64.0 in | Wt 181.8 lb

## 2021-11-03 DIAGNOSIS — E1159 Type 2 diabetes mellitus with other circulatory complications: Secondary | ICD-10-CM | POA: Diagnosis not present

## 2021-11-03 DIAGNOSIS — Z96651 Presence of right artificial knee joint: Secondary | ICD-10-CM | POA: Diagnosis not present

## 2021-11-03 DIAGNOSIS — I10 Essential (primary) hypertension: Secondary | ICD-10-CM

## 2021-11-03 DIAGNOSIS — E1140 Type 2 diabetes mellitus with diabetic neuropathy, unspecified: Secondary | ICD-10-CM

## 2021-11-03 DIAGNOSIS — R682 Dry mouth, unspecified: Secondary | ICD-10-CM | POA: Diagnosis not present

## 2021-11-03 DIAGNOSIS — E119 Type 2 diabetes mellitus without complications: Secondary | ICD-10-CM

## 2021-11-03 DIAGNOSIS — I152 Hypertension secondary to endocrine disorders: Secondary | ICD-10-CM

## 2021-11-03 LAB — POCT GLYCOSYLATED HEMOGLOBIN (HGB A1C): Hemoglobin A1C: 5.3 % (ref 4.0–5.6)

## 2021-11-03 NOTE — Progress Notes (Signed)
Patient ID: [name], female    DOB: 1946/06/29, 76 y.o.   MRN: [id number]  This visit was conducted in person.  BP 134/64 (BP Location: Left Arm, Patient Position: Sitting, Cuff Size: Large)   Pulse 97   Temp 98.5 F (36.9 C) (Oral)   Ht 5' 4" (1.626 m)   Wt 181 lb 12.8 oz (82.5 kg)   SpO2 98%   BMI 31.21 kg/m    CC: Chief Complaint  Patient presents with   Follow-up    Subjective:   HPI: [name] is a 76 y.o. female presenting on 11/03/2021 for Follow-up  Had 3/28/ 23 total knee replacement.   Having PT.   Went to ED 10/13/2021 for swelling.Marland Kitchen given pain med. She was not interested in rehab placement at that time. 10/30/2021 staples removed by ortho.  Earlier today knee pain has  increased... she has been icing and elevating.  She has a very supportive friend at the office with her today.. lives next door.  Diabetes: Diet controlled Lab Results  Component Value Date   HGBA1C 5.3 11/03/2021  Using medications without difficulties: Hypoglycemic episodes: none Hyperglycemic episodes: none Feet problems: non ulcers Blood Sugars averaging: eye exam within last year: yes  Hypertension:  Well controlled on irbesartan 150 mg daily using Lasix 20 mg as needed,  BP Readings from Last 3 Encounters:  11/03/21 134/64  10/31/21 110/72  10/16/21 (!) 143/71  Using medication without problems or lightheadedness:  none Chest pain with exertion: none Edema: none Short of breath: none Average home BPs: Other issues:   Dry mouth.. she is  possibly taking meclizine  Wt Readings from Last 3 Encounters:  11/03/21 181 lb 12.8 oz (82.5 kg)  10/31/21 179 lb (81.2 kg)  10/16/21 189 lb (85.7 kg)       Relevant past medical, surgical, family and social history reviewed and updated as indicated. Interim medical history since our last visit reviewed. Allergies and medications reviewed and updated. Outpatient Medications Prior to Visit  Medication Sig Dispense Refill    acetaminophen (TYLENOL) 325 MG tablet Take by mouth.     acetaminophen (TYLENOL) 650 MG CR tablet Take 1,300 mg by mouth daily as needed for pain.     Acetaminophen-Codeine 300-30 MG tablet Take 1 tablet by mouth every 6 (six) hours as needed for pain. 30 tablet 0   ADVAIR HFA 230-21 MCG/ACT inhaler INHALE 2 PUFFS BY MOUTH TWICE DAILY TO PREVENT COUGH OR WHEEZING. RINSE MOUTH AFTER USE 12 g 5   albuterol (VENTOLIN HFA) 108 (90 Base) MCG/ACT inhaler INHALE 2 PUFFS BY MOUTH EVERY 6 HOURS AS NEEDED FOR WHEEZING OR SHORTNESS OF BREATH 54 g 0   atorvastatin (LIPITOR) 10 MG tablet TAKE 1 TABLET(10 MG) BY MOUTH DAILY 90 tablet 3   Blood Glucose Monitoring Suppl (ONETOUCH VERIO) w/Device KIT Use to check blood sugar up to 2 times a day 1 kit 0   cyclobenzaprine (FLEXERIL) 5 MG tablet Take 1-2 tablets (5-10 mg total) by mouth 3 (three) times daily as needed for muscle spasms. 20 tablet 0   dextromethorphan-guaiFENesin (MUCINEX DM) 30-600 MG 12hr tablet Take 1 tablet by mouth 2 (two) times daily.     diclofenac Sodium (VOLTAREN) 1 % GEL Apply 2 g topically 4 (four) times daily. 400 g 1   doxycycline (VIBRAMYCIN) 100 MG capsule Take 1 capsule (100 mg total) by mouth 2 (two) times daily. 20 capsule 0   EPINEPHrine 0.3 mg/0.3 mL IJ SOAJ  injection USE AS DIRECTED FOR LIFE THREATENING ALLERGIC REACTIONS 2 each 2   famotidine (PEPCID) 40 MG tablet TAKE 1 TABLET(40 MG) BY MOUTH DAILY 30 tablet 5   fluticasone (FLOVENT HFA) 220 MCG/ACT inhaler 2 puffs 2 times daily during Flare Up. 12 g 5   furosemide (LASIX) 20 MG tablet Take 20 mg as needed for a weight gain of 3 pounds overnight or 5 pounds in one week,. 30 tablet 3   gabapentin (NEURONTIN) 600 MG tablet TAKE 1 TABLET BY MOUTH EVERY DAY AT BREAKFAST, 1 TABLET AT LUNCH AND 2 TABLETS AT BEDTIME 360 tablet 1   GLYXAMBI 10-5 MG TABS TAKE 1 TABLET BY MOUTH DAILY 30 tablet 5   Homeopathic Products (THERAWORX RELIEF EX) Apply 1 spray topically daily as needed (muscle  cramps).     hydrOXYzine (ATARAX/VISTARIL) 10 MG tablet Take 1 tablet (10 mg total) by mouth daily as needed for itching. 30 tablet 2   ipratropium (ATROVENT) 0.06 % nasal spray USE 1 TO 2 SPRAYS IN EACH NOSTRIL 1 TO 2 TIMES PER DAY 15 mL 5   irbesartan (AVAPRO) 150 MG tablet Take 1 tablet (150 mg total) by mouth daily. 90 tablet 2   Lactase (DAIRY-RELIEF PO) Take 1 capsule by mouth daily as needed (eating dairy).      Lancets (ONETOUCH DELICA PLUS [id number]) MISC USE TO CHECK BLOOD SUGAR UP TO TWICE DAILY 100 each 5   levalbuterol (XOPENEX) 1.25 MG/3ML nebulizer solution USE 1 VIAL VIA NEBULIZER EVERY 6 HOURS AS NEEDED FOR SHORTNESS OF BREATH OR WHEEZING 900 mL 1   liver oil-zinc oxide (DESITIN) 40 % ointment Apply 1 application topically as needed for irritation.     meclizine (ANTIVERT) 12.5 MG tablet Take 1 tablet (12.5 mg total) by mouth 3 (three) times daily as needed for dizziness. 15 tablet 0   Menthol-Methyl Salicylate (SALONPAS JET SPRAY EX) Apply 1 spray topically daily as needed (knee pain).     methimazole (TAPAZOLE) 5 MG tablet Take 1 tablet (5 mg total) by mouth daily. 90 tablet 3   montelukast (SINGULAIR) 10 MG tablet Take 1 tablet (10 mg total) by mouth at bedtime. For asthma control. 90 tablet 1   Nutritional Supplements (NUTRITIONAL SUPPLEMENT PO) Take by mouth. Omega XL - Take 2 gel capsules 2 times a day     nystatin (MYCOSTATIN) 100000 UNIT/ML suspension 67m swish and swallow after Advair 2 times daily. 300 mL 5   ondansetron (ZOFRAN-ODT) 4 MG disintegrating tablet Take 1 tablet (4 mg total) by mouth every 8 (eight) hours as needed for nausea or vomiting. 20 tablet 0   ondansetron (ZOFRAN-ODT) 8 MG disintegrating tablet Take 1 tablet (8 mg total) by mouth every 8 (eight) hours as needed for nausea or vomiting. 20 tablet 0   ONETOUCH VERIO test strip USE TO CHECK BLOOD SUGAR UP TO TWICE DAILY 100 strip 5   oxybutynin (DITROPAN-XL) 10 MG 24 hr tablet Take 10 mg by mouth daily.      oxyCODONE-acetaminophen (PERCOCET/ROXICET) 5-325 MG tablet Take 1-2 tablets by mouth every 6 (six) hours as needed for severe pain. 15 tablet 0   pantoprazole (PROTONIX) 40 MG tablet TAKE 1 TABLET(40 MG) BY MOUTH DAILY 90 tablet 1   PATADAY 0.2 % SOLN Place 1 drop into both eyes daily as needed (allergies).   4   Polyethyl Glycol-Propyl Glycol (SYSTANE OP) Place 1 drop into both eyes in the morning and at bedtime.     potassium chloride SA (KLOR-CON)  20 MEQ tablet Take 1 tablet (20 mEq total) by mouth as needed (Take one daily when you need to take a Furosemide). 30 tablet 0   SPIRIVA RESPIMAT 1.25 MCG/ACT AERS INHALE 2 PUFFS INTO THE LUNGS DAILY 4 g 5   tolterodine (DETROL LA) 4 MG 24 hr capsule Take 4 mg by mouth daily.     TRULANCE 3 MG TABS Take 3 mg by mouth every other day.     XARELTO 20 MG TABS tablet TAKE 1 TABLET(20 MG) BY MOUTH DAILY WITH SUPPER 90 tablet 1   Facility-Administered Medications Prior to Visit  Medication Dose Route Frequency Provider Last Rate Last Admin   omalizumab Arvid Right) injection 300 mg  300 mg Subcutaneous Q28 days Kozlow, Donnamarie Poag, MD   300 mg at 10/17/21 4403     Per HPI unless specifically indicated in ROS section below Review of Systems  Constitutional:  Negative for fatigue and fever.  HENT:  Negative for congestion.   Eyes:  Negative for pain.  Respiratory:  Negative for cough and shortness of breath.   Cardiovascular:  Negative for chest pain, palpitations and leg swelling.  Gastrointestinal:  Negative for abdominal pain.  Genitourinary:  Negative for dysuria and vaginal bleeding.  Musculoskeletal:  Positive for arthralgias and gait problem. Negative for back pain.  Neurological:  Negative for syncope, light-headedness and headaches.  Psychiatric/Behavioral:  Negative for dysphoric mood.   Objective:  BP 134/64 (BP Location: Left Arm, Patient Position: Sitting, Cuff Size: Large)   Pulse 97   Temp 98.5 F (36.9 C) (Oral)   Ht 5' 4" (1.626 m)    Wt 181 lb 12.8 oz (82.5 kg)   SpO2 98%   BMI 31.21 kg/m   Wt Readings from Last 3 Encounters:  11/03/21 181 lb 12.8 oz (82.5 kg)  10/31/21 179 lb (81.2 kg)  10/16/21 189 lb (85.7 kg)      Physical Exam Constitutional:      General: She is not in acute distress.    Appearance: Normal appearance. She is well-developed. She is not ill-appearing or toxic-appearing.  HENT:     Head: Normocephalic.     Right Ear: Hearing, tympanic membrane, ear canal and external ear normal. Tympanic membrane is not erythematous, retracted or bulging.     Left Ear: Hearing, tympanic membrane, ear canal and external ear normal. Tympanic membrane is not erythematous, retracted or bulging.     Nose: No mucosal edema or rhinorrhea.     Right Sinus: No maxillary sinus tenderness or frontal sinus tenderness.     Left Sinus: No maxillary sinus tenderness or frontal sinus tenderness.     Mouth/Throat:     Pharynx: Uvula midline.  Eyes:     General: Lids are normal. Lids are everted, no foreign bodies appreciated.     Conjunctiva/sclera: Conjunctivae normal.     Pupils: Pupils are equal, round, and reactive to light.  Neck:     Thyroid: No thyroid mass or thyromegaly.     Vascular: No carotid bruit.     Trachea: Trachea normal.  Cardiovascular:     Rate and Rhythm: Normal rate and regular rhythm.     Pulses: Normal pulses.     Heart sounds: Normal heart sounds, S1 normal and S2 normal. No murmur heard.   No friction rub. No gallop.  Pulmonary:     Effort: Pulmonary effort is normal. No tachypnea or respiratory distress.     Breath sounds: Normal breath sounds. No decreased breath sounds,  wheezing, rhonchi or rales.  Abdominal:     General: Bowel sounds are normal.     Palpations: Abdomen is soft.     Tenderness: There is no abdominal tenderness.  Musculoskeletal:     Cervical back: Normal range of motion and neck supple.     Right knee: Decreased range of motion. Tenderness present.     Comments:  Healing surgical site on right anterior knee.  Skin:    General: Skin is warm and dry.     Findings: No rash.  Neurological:     Mental Status: She is alert.     Gait: Gait abnormal.  Psychiatric:        Mood and Affect: Mood is not anxious or depressed.        Speech: Speech normal.        Behavior: Behavior normal. Behavior is cooperative.        Thought Content: Thought content normal.        Judgment: Judgment normal.      Results for orders placed or performed in visit on 11/03/21  POCT HgB A1C  Result Value Ref Range   Hemoglobin A1C 5.3 4.0 - 5.6 %   HbA1c POC (<> result, manual entry)     HbA1c, POC (prediabetic range)     HbA1c, POC (controlled diabetic range)     *Note: Due to a large number of results and/or encounters for the requested time period, some results have not been displayed. A complete set of results can be found in Results Review.    This visit occurred during the SARS-CoV-2 public health emergency.  Safety protocols were in place, including screening questions prior to the visit, additional usage of staff PPE, and extensive cleaning of exam room while observing appropriate contact time as indicated for disinfecting solutions.   COVID 19 screen:  No recent travel or known exposure to COVID19 The patient denies respiratory symptoms of COVID 19 at this time. The importance of social distancing was discussed today.   Assessment and Plan Problem List Items Addressed This Visit     Diabetes mellitus with neuropathy ([place])    Chronic, diet controlled       Dry mouth    Acute, but recurrent Likely due to medication.  She feels she may be taking meclizin, but is unsure.  I have encouraged her to stop this medication.       Hypertension associated with diabetes ([place]) - Primary    Stable, chronic.  Continue current medication.   Well controlled on irbesartan 150 mg daily using Lasix 20 mg as needed,       Relevant Orders   POCT HgB A1C (Completed)    Status post total left knee replacement    Initial issues with swelling and pain, but patient is recovering well now.  She is icing and elevating her leg.  She has a lot of help from a friend who lives next-door.  She is currently progressing through physical therapy at home.           [name], MD

## 2021-11-03 NOTE — Patient Instructions (Addendum)
Stop meclizine if you are taking. ? Stop oxybutynin or  tolterodine (detrol LA) if you are taking and not having urinary frequency. ? Call Ortho if knee pain not continuing to improve. ?

## 2021-11-07 DIAGNOSIS — Z96651 Presence of right artificial knee joint: Secondary | ICD-10-CM | POA: Diagnosis not present

## 2021-11-07 DIAGNOSIS — Z471 Aftercare following joint replacement surgery: Secondary | ICD-10-CM | POA: Diagnosis not present

## 2021-11-07 DIAGNOSIS — I152 Hypertension secondary to endocrine disorders: Secondary | ICD-10-CM | POA: Diagnosis not present

## 2021-11-07 DIAGNOSIS — I272 Pulmonary hypertension, unspecified: Secondary | ICD-10-CM | POA: Diagnosis not present

## 2021-11-07 DIAGNOSIS — E1159 Type 2 diabetes mellitus with other circulatory complications: Secondary | ICD-10-CM | POA: Diagnosis not present

## 2021-11-07 DIAGNOSIS — J4540 Moderate persistent asthma, uncomplicated: Secondary | ICD-10-CM | POA: Diagnosis not present

## 2021-11-08 ENCOUNTER — Telehealth: Payer: Self-pay | Admitting: *Deleted

## 2021-11-08 ENCOUNTER — Other Ambulatory Visit (INDEPENDENT_AMBULATORY_CARE_PROVIDER_SITE_OTHER): Payer: Medicare Other | Admitting: Podiatry

## 2021-11-08 DIAGNOSIS — B379 Candidiasis, unspecified: Secondary | ICD-10-CM

## 2021-11-08 DIAGNOSIS — T3695XA Adverse effect of unspecified systemic antibiotic, initial encounter: Secondary | ICD-10-CM

## 2021-11-08 MED ORDER — FLUCONAZOLE 150 MG PO TABS: 150.0000 mg | ORAL_TABLET | Freq: Once | ORAL | 0 refills | Status: AC

## 2021-11-08 NOTE — Progress Notes (Signed)
Sent Rx for one dose Diflucan forantbiotic induced yeast infection. ?

## 2021-11-08 NOTE — Telephone Encounter (Signed)
Patient is requesting something for a yeast infection which developed after taking the doxycycline prescribed. Please advise. ?

## 2021-11-10 ENCOUNTER — Other Ambulatory Visit: Payer: Self-pay | Admitting: Family Medicine

## 2021-11-10 DIAGNOSIS — J4540 Moderate persistent asthma, uncomplicated: Secondary | ICD-10-CM | POA: Diagnosis not present

## 2021-11-10 DIAGNOSIS — Z471 Aftercare following joint replacement surgery: Secondary | ICD-10-CM | POA: Diagnosis not present

## 2021-11-10 DIAGNOSIS — E1159 Type 2 diabetes mellitus with other circulatory complications: Secondary | ICD-10-CM | POA: Diagnosis not present

## 2021-11-10 DIAGNOSIS — I272 Pulmonary hypertension, unspecified: Secondary | ICD-10-CM | POA: Diagnosis not present

## 2021-11-10 DIAGNOSIS — I152 Hypertension secondary to endocrine disorders: Secondary | ICD-10-CM | POA: Diagnosis not present

## 2021-11-10 DIAGNOSIS — Z96651 Presence of right artificial knee joint: Secondary | ICD-10-CM | POA: Diagnosis not present

## 2021-11-10 MED ORDER — FLUCONAZOLE 150 MG PO TABS: 150.0000 mg | ORAL_TABLET | Freq: Once | ORAL | 0 refills | Status: AC

## 2021-11-10 NOTE — Telephone Encounter (Signed)
Pt called and asked to speak to the nurse about a medication she was given. Pt did not tell me what the name of the medication was. Can someone advise please?  ? ?Callback Number: [phone] ?

## 2021-11-10 NOTE — Telephone Encounter (Signed)
Spoke with Ms. Silos.  She was put on Doxycyline by the podiatrist and now she has a yeast infection.  She has called their office all week trying to get them to prescribe her something for the yeast infection but they have never called her back.  She is asking if Dr. Diona Browner would please send her something in.  She states the itching is so bad.  [facility] on [address].   ?

## 2021-11-13 DIAGNOSIS — J4550 Severe persistent asthma, uncomplicated: Secondary | ICD-10-CM | POA: Diagnosis not present

## 2021-11-14 ENCOUNTER — Ambulatory Visit (INDEPENDENT_AMBULATORY_CARE_PROVIDER_SITE_OTHER): Payer: Medicare Other

## 2021-11-14 DIAGNOSIS — J4550 Severe persistent asthma, uncomplicated: Secondary | ICD-10-CM

## 2021-11-16 DIAGNOSIS — J4540 Moderate persistent asthma, uncomplicated: Secondary | ICD-10-CM | POA: Diagnosis not present

## 2021-11-16 DIAGNOSIS — Z96651 Presence of right artificial knee joint: Secondary | ICD-10-CM | POA: Diagnosis not present

## 2021-11-16 DIAGNOSIS — Z471 Aftercare following joint replacement surgery: Secondary | ICD-10-CM | POA: Diagnosis not present

## 2021-11-16 DIAGNOSIS — I272 Pulmonary hypertension, unspecified: Secondary | ICD-10-CM | POA: Diagnosis not present

## 2021-11-16 DIAGNOSIS — E1159 Type 2 diabetes mellitus with other circulatory complications: Secondary | ICD-10-CM | POA: Diagnosis not present

## 2021-11-16 DIAGNOSIS — I152 Hypertension secondary to endocrine disorders: Secondary | ICD-10-CM | POA: Diagnosis not present

## 2021-11-17 DIAGNOSIS — Z7901 Long term (current) use of anticoagulants: Secondary | ICD-10-CM

## 2021-11-17 DIAGNOSIS — Z471 Aftercare following joint replacement surgery: Secondary | ICD-10-CM | POA: Diagnosis not present

## 2021-11-17 DIAGNOSIS — Z9181 History of falling: Secondary | ICD-10-CM

## 2021-11-17 DIAGNOSIS — E1159 Type 2 diabetes mellitus with other circulatory complications: Secondary | ICD-10-CM | POA: Diagnosis not present

## 2021-11-17 DIAGNOSIS — E785 Hyperlipidemia, unspecified: Secondary | ICD-10-CM | POA: Diagnosis not present

## 2021-11-17 DIAGNOSIS — J4540 Moderate persistent asthma, uncomplicated: Secondary | ICD-10-CM | POA: Diagnosis not present

## 2021-11-17 DIAGNOSIS — E1169 Type 2 diabetes mellitus with other specified complication: Secondary | ICD-10-CM | POA: Diagnosis not present

## 2021-11-17 DIAGNOSIS — Z96651 Presence of right artificial knee joint: Secondary | ICD-10-CM | POA: Diagnosis not present

## 2021-11-17 DIAGNOSIS — I5032 Chronic diastolic (congestive) heart failure: Secondary | ICD-10-CM | POA: Diagnosis not present

## 2021-11-17 DIAGNOSIS — I152 Hypertension secondary to endocrine disorders: Secondary | ICD-10-CM | POA: Diagnosis not present

## 2021-11-17 DIAGNOSIS — E1140 Type 2 diabetes mellitus with diabetic neuropathy, unspecified: Secondary | ICD-10-CM | POA: Diagnosis not present

## 2021-11-17 DIAGNOSIS — I081 Rheumatic disorders of both mitral and tricuspid valves: Secondary | ICD-10-CM

## 2021-11-17 DIAGNOSIS — Z7951 Long term (current) use of inhaled steroids: Secondary | ICD-10-CM

## 2021-11-17 DIAGNOSIS — F325 Major depressive disorder, single episode, in full remission: Secondary | ICD-10-CM

## 2021-11-17 DIAGNOSIS — Z86718 Personal history of other venous thrombosis and embolism: Secondary | ICD-10-CM

## 2021-11-17 DIAGNOSIS — Z86711 Personal history of pulmonary embolism: Secondary | ICD-10-CM

## 2021-11-17 DIAGNOSIS — I4891 Unspecified atrial fibrillation: Secondary | ICD-10-CM | POA: Diagnosis not present

## 2021-11-17 DIAGNOSIS — Z7984 Long term (current) use of oral hypoglycemic drugs: Secondary | ICD-10-CM

## 2021-11-17 DIAGNOSIS — I484 Atypical atrial flutter: Secondary | ICD-10-CM | POA: Diagnosis not present

## 2021-11-17 DIAGNOSIS — I272 Pulmonary hypertension, unspecified: Secondary | ICD-10-CM | POA: Diagnosis not present

## 2021-11-18 DIAGNOSIS — I081 Rheumatic disorders of both mitral and tricuspid valves: Secondary | ICD-10-CM | POA: Diagnosis not present

## 2021-11-18 DIAGNOSIS — E1169 Type 2 diabetes mellitus with other specified complication: Secondary | ICD-10-CM | POA: Diagnosis not present

## 2021-11-18 DIAGNOSIS — I4891 Unspecified atrial fibrillation: Secondary | ICD-10-CM | POA: Diagnosis not present

## 2021-11-18 DIAGNOSIS — I272 Pulmonary hypertension, unspecified: Secondary | ICD-10-CM | POA: Diagnosis not present

## 2021-11-18 DIAGNOSIS — Z86718 Personal history of other venous thrombosis and embolism: Secondary | ICD-10-CM | POA: Diagnosis not present

## 2021-11-18 DIAGNOSIS — E1140 Type 2 diabetes mellitus with diabetic neuropathy, unspecified: Secondary | ICD-10-CM | POA: Diagnosis not present

## 2021-11-18 DIAGNOSIS — E785 Hyperlipidemia, unspecified: Secondary | ICD-10-CM | POA: Diagnosis not present

## 2021-11-18 DIAGNOSIS — Z7901 Long term (current) use of anticoagulants: Secondary | ICD-10-CM | POA: Diagnosis not present

## 2021-11-18 DIAGNOSIS — Z471 Aftercare following joint replacement surgery: Secondary | ICD-10-CM | POA: Diagnosis not present

## 2021-11-18 DIAGNOSIS — I152 Hypertension secondary to endocrine disorders: Secondary | ICD-10-CM | POA: Diagnosis not present

## 2021-11-18 DIAGNOSIS — Z7984 Long term (current) use of oral hypoglycemic drugs: Secondary | ICD-10-CM | POA: Diagnosis not present

## 2021-11-18 DIAGNOSIS — F325 Major depressive disorder, single episode, in full remission: Secondary | ICD-10-CM | POA: Diagnosis not present

## 2021-11-18 DIAGNOSIS — E1159 Type 2 diabetes mellitus with other circulatory complications: Secondary | ICD-10-CM | POA: Diagnosis not present

## 2021-11-18 DIAGNOSIS — I5032 Chronic diastolic (congestive) heart failure: Secondary | ICD-10-CM | POA: Diagnosis not present

## 2021-11-18 DIAGNOSIS — Z96651 Presence of right artificial knee joint: Secondary | ICD-10-CM | POA: Diagnosis not present

## 2021-11-18 DIAGNOSIS — Z9181 History of falling: Secondary | ICD-10-CM | POA: Diagnosis not present

## 2021-11-18 DIAGNOSIS — Z86711 Personal history of pulmonary embolism: Secondary | ICD-10-CM | POA: Diagnosis not present

## 2021-11-18 DIAGNOSIS — Z7951 Long term (current) use of inhaled steroids: Secondary | ICD-10-CM | POA: Diagnosis not present

## 2021-11-18 DIAGNOSIS — I484 Atypical atrial flutter: Secondary | ICD-10-CM | POA: Diagnosis not present

## 2021-11-18 DIAGNOSIS — J4540 Moderate persistent asthma, uncomplicated: Secondary | ICD-10-CM | POA: Diagnosis not present

## 2021-11-23 DIAGNOSIS — Z96651 Presence of right artificial knee joint: Secondary | ICD-10-CM | POA: Diagnosis not present

## 2021-11-23 DIAGNOSIS — J4540 Moderate persistent asthma, uncomplicated: Secondary | ICD-10-CM | POA: Diagnosis not present

## 2021-11-23 DIAGNOSIS — I152 Hypertension secondary to endocrine disorders: Secondary | ICD-10-CM | POA: Diagnosis not present

## 2021-11-23 DIAGNOSIS — I272 Pulmonary hypertension, unspecified: Secondary | ICD-10-CM | POA: Diagnosis not present

## 2021-11-23 DIAGNOSIS — Z471 Aftercare following joint replacement surgery: Secondary | ICD-10-CM | POA: Diagnosis not present

## 2021-11-23 DIAGNOSIS — E1159 Type 2 diabetes mellitus with other circulatory complications: Secondary | ICD-10-CM | POA: Diagnosis not present

## 2021-11-26 ENCOUNTER — Other Ambulatory Visit: Payer: Self-pay | Admitting: Allergy and Immunology

## 2021-11-29 DIAGNOSIS — E1159 Type 2 diabetes mellitus with other circulatory complications: Secondary | ICD-10-CM | POA: Diagnosis not present

## 2021-11-29 DIAGNOSIS — I152 Hypertension secondary to endocrine disorders: Secondary | ICD-10-CM | POA: Diagnosis not present

## 2021-11-29 DIAGNOSIS — I272 Pulmonary hypertension, unspecified: Secondary | ICD-10-CM | POA: Diagnosis not present

## 2021-11-29 DIAGNOSIS — Z96651 Presence of right artificial knee joint: Secondary | ICD-10-CM | POA: Diagnosis not present

## 2021-11-29 DIAGNOSIS — Z471 Aftercare following joint replacement surgery: Secondary | ICD-10-CM | POA: Diagnosis not present

## 2021-11-29 DIAGNOSIS — J4540 Moderate persistent asthma, uncomplicated: Secondary | ICD-10-CM | POA: Diagnosis not present

## 2021-12-05 ENCOUNTER — Ambulatory Visit: Payer: Medicare Other | Admitting: Allergy and Immunology

## 2021-12-05 NOTE — Telephone Encounter (Signed)
Patient is doing better since prescription.

## 2021-12-06 ENCOUNTER — Ambulatory Visit (INDEPENDENT_AMBULATORY_CARE_PROVIDER_SITE_OTHER): Payer: Medicare Other | Admitting: Podiatry

## 2021-12-06 ENCOUNTER — Encounter: Payer: Self-pay | Admitting: Podiatry

## 2021-12-06 DIAGNOSIS — H04123 Dry eye syndrome of bilateral lacrimal glands: Secondary | ICD-10-CM | POA: Diagnosis not present

## 2021-12-06 DIAGNOSIS — D2372 Other benign neoplasm of skin of left lower limb, including hip: Secondary | ICD-10-CM

## 2021-12-06 DIAGNOSIS — M7752 Other enthesopathy of left foot: Secondary | ICD-10-CM

## 2021-12-06 DIAGNOSIS — H25013 Cortical age-related cataract, bilateral: Secondary | ICD-10-CM | POA: Diagnosis not present

## 2021-12-06 DIAGNOSIS — E119 Type 2 diabetes mellitus without complications: Secondary | ICD-10-CM | POA: Diagnosis not present

## 2021-12-06 DIAGNOSIS — H40013 Open angle with borderline findings, low risk, bilateral: Secondary | ICD-10-CM | POA: Diagnosis not present

## 2021-12-06 LAB — HM DIABETES EYE EXAM

## 2021-12-06 MED ORDER — DEXAMETHASONE SODIUM PHOSPHATE 120 MG/30ML IJ SOLN
2.0000 mg | Freq: Once | INTRAMUSCULAR | Status: AC
  Administered 2021-12-06: 2 mg via INTRA_ARTICULAR

## 2021-12-06 NOTE — Progress Notes (Signed)
She presents today for follow-up of her painful bursitis some fifth metatarsal base of her left foot.  Objective: Vital signs are stable alert and oriented x3 she states that she is doing much better today.  She still has some palpable bursitis on the lateral aspect of the fifth metatarsal there is some postinflammatory hyperpigmentation but no skin breakdown.  Assessment: Mild tenderness on palpation of the bursitis but overall is doing much better.  Plan: Reinjected today with dexamethasone and local anesthetic.

## 2021-12-08 DIAGNOSIS — J4550 Severe persistent asthma, uncomplicated: Secondary | ICD-10-CM | POA: Diagnosis not present

## 2021-12-08 DIAGNOSIS — K219 Gastro-esophageal reflux disease without esophagitis: Secondary | ICD-10-CM | POA: Diagnosis not present

## 2021-12-08 DIAGNOSIS — J3089 Other allergic rhinitis: Secondary | ICD-10-CM | POA: Diagnosis not present

## 2021-12-09 DIAGNOSIS — Z96652 Presence of left artificial knee joint: Secondary | ICD-10-CM | POA: Insufficient documentation

## 2021-12-09 NOTE — Assessment & Plan Note (Signed)
Chronic, diet controlled. 

## 2021-12-09 NOTE — Assessment & Plan Note (Signed)
Initial issues with swelling and pain, but patient is recovering well now.  She is icing and elevating her leg.  She has a lot of help from a friend who lives next-door.  She is currently progressing through physical therapy at home.

## 2021-12-09 NOTE — Assessment & Plan Note (Signed)
Stable, chronic.  Continue current medication.   Well controlled on irbesartan 150 mg daily using Lasix 20 mg as needed,

## 2021-12-09 NOTE — Assessment & Plan Note (Signed)
Acute, but recurrent Likely due to medication.  She feels she may be taking meclizin, but is unsure.  I have encouraged her to stop this medication.

## 2021-12-12 ENCOUNTER — Ambulatory Visit (INDEPENDENT_AMBULATORY_CARE_PROVIDER_SITE_OTHER): Payer: Medicare Other | Admitting: Allergy and Immunology

## 2021-12-12 ENCOUNTER — Encounter: Payer: Self-pay | Admitting: Family Medicine

## 2021-12-12 ENCOUNTER — Ambulatory Visit: Payer: Medicare Other

## 2021-12-12 ENCOUNTER — Telehealth: Payer: Self-pay | Admitting: Family Medicine

## 2021-12-12 ENCOUNTER — Ambulatory Visit (INDEPENDENT_AMBULATORY_CARE_PROVIDER_SITE_OTHER): Payer: Medicare Other | Admitting: Family Medicine

## 2021-12-12 VITALS — BP 102/62 | HR 91 | Temp 98.5°F | Ht 64.0 in | Wt 184.6 lb

## 2021-12-12 VITALS — BP 100/60 | HR 64 | Temp 98.3°F | Resp 16 | Ht 64.0 in | Wt 184.0 lb

## 2021-12-12 DIAGNOSIS — J3089 Other allergic rhinitis: Secondary | ICD-10-CM | POA: Diagnosis not present

## 2021-12-12 DIAGNOSIS — N949 Unspecified condition associated with female genital organs and menstrual cycle: Secondary | ICD-10-CM | POA: Diagnosis not present

## 2021-12-12 DIAGNOSIS — D649 Anemia, unspecified: Secondary | ICD-10-CM | POA: Diagnosis not present

## 2021-12-12 DIAGNOSIS — K219 Gastro-esophageal reflux disease without esophagitis: Secondary | ICD-10-CM

## 2021-12-12 DIAGNOSIS — J4550 Severe persistent asthma, uncomplicated: Secondary | ICD-10-CM

## 2021-12-12 DIAGNOSIS — T781XXD Other adverse food reactions, not elsewhere classified, subsequent encounter: Secondary | ICD-10-CM

## 2021-12-12 DIAGNOSIS — K5909 Other constipation: Secondary | ICD-10-CM | POA: Diagnosis not present

## 2021-12-12 MED ORDER — EPINEPHRINE 0.3 MG/0.3ML IJ SOAJ: INTRAMUSCULAR | 2 refills | Status: DC

## 2021-12-12 MED ORDER — FLUTICASONE PROPIONATE HFA 220 MCG/ACT IN AERO: INHALATION_SPRAY | RESPIRATORY_TRACT | 5 refills | Status: DC

## 2021-12-12 MED ORDER — SPIRIVA RESPIMAT 1.25 MCG/ACT IN AERS: 2.0000 | INHALATION_SPRAY | Freq: Every day | RESPIRATORY_TRACT | 5 refills | Status: DC

## 2021-12-12 MED ORDER — IPRATROPIUM BROMIDE 0.06 % NA SOLN: NASAL | 5 refills | Status: DC

## 2021-12-12 MED ORDER — MONTELUKAST SODIUM 10 MG PO TABS: 10.0000 mg | ORAL_TABLET | Freq: Every day | ORAL | 1 refills | Status: DC

## 2021-12-12 MED ORDER — FLUTICASONE-SALMETEROL 230-21 MCG/ACT IN AERO: INHALATION_SPRAY | RESPIRATORY_TRACT | 5 refills | Status: DC

## 2021-12-12 MED ORDER — ALBUTEROL SULFATE HFA 108 (90 BASE) MCG/ACT IN AERS: INHALATION_SPRAY | RESPIRATORY_TRACT | 1 refills | Status: DC

## 2021-12-12 MED ORDER — LEVALBUTEROL HCL 1.25 MG/3ML IN NEBU: INHALATION_SOLUTION | RESPIRATORY_TRACT | 1 refills | Status: DC

## 2021-12-12 MED ORDER — PANTOPRAZOLE SODIUM 40 MG PO TBEC: DELAYED_RELEASE_TABLET | ORAL | 1 refills | Status: DC

## 2021-12-12 MED ORDER — FAMOTIDINE 40 MG PO TABS
ORAL_TABLET | ORAL | 1 refills | Status: DC
Start: 2021-12-12 — End: 2021-12-26

## 2021-12-12 MED ORDER — NYSTATIN 100000 UNIT/ML MT SUSP: OROMUCOSAL | 5 refills | Status: DC

## 2021-12-12 NOTE — Telephone Encounter (Signed)
Pt is stating her vaginal area has been bothering her (did not specify anything else, other than that), she wants to speak with the nurse about this. Please return a call back when possible, thanks.   Callback Number: [phone]

## 2021-12-12 NOTE — Telephone Encounter (Signed)
Noted.  Likely yeast infection.

## 2021-12-12 NOTE — Telephone Encounter (Signed)
Please triage

## 2021-12-12 NOTE — Patient Instructions (Signed)
  1. Continue Advair 230 - 2 inhalations 1-2 times a day depending on disease activity  2. Continue Spiriva 2.5 respimat - 1 inhalations 1 time per day.  3. Continue Montelukast '10mg'$  - 1 tablet 1 time per day.  4. Continue Xolair (and Epi-Pen)  5. Continue pantoprazole '40mg'$  in the AM and Famotidine 40 mg in the PM.    6. START OTC NASACORT - 1 spray each nostril 1 time per day (takes days to work)  7. If needed:   A. Nasal saline several times per day  B. Mucinex DM two times per day  C. Ipratropium 0.06% -1-2 sprays each nostril 1-2 times per day to dry nose  D. Proventil HFA or albuterol nebulization  E. nystatin 5 mL swish and swallow after Advair 2 times a day  8. Add Flovent 220 - 2 inhalations 2 times a day during "FLARE UP"  9. Return to clinic in 6 months or earlier if there is a problem.

## 2021-12-12 NOTE — Patient Instructions (Addendum)
Start topical steroid cream Cortisone 10 twice daily on irritated skin.  We will call with test results.

## 2021-12-12 NOTE — Progress Notes (Signed)
[facility] [place] [facility] [place]   Follow-up Note  Referring Provider: [name], MD Primary Provider: [name], MD Date of Office Visit: 12/12/2021  Subjective:   [name] (DOB: 1946/05/16) is a 76 y.o. female who returns to the [facility] on 12/12/2021 in re-evaluation of the following:  HPI: [name] presents to this clinic in reevaluation of asthma, allergic rhinitis, LPR, possible food allergy directed against shellfish and fish, and history of pulmonary hypertension and diastolic dysfunction.  I last saw her in this clinic on 06 June 2021.  Her asthma has been relatively good.  She does not use a bronchodilator very often and has not required a systemic steroid for a asthma exacerbation while she continues to use Advair mostly 1 time per day and Spiriva on a pretty consistent basis along with a leukotriene modifier and monthly omalizumab.  She went through a total knee replacement this spring and did not have any respiratory difficulty with that procedure.  She has not been having any problems with thrush while using her inhalers.  She has been having some sneezing and some runny nose.  She does not use a nasal steroid because she developed a significant problem in the past with nasal steroids in the form of epistaxis.  She believes that her reflux is under good control at this point in time.  She can now eat fish without any problem.  She has not been eating any shellfish.  Allergies as of 12/12/2021       Reactions   Latex Hives   Oxycodone Nausea Only, Nausea And Vomiting   Other reaction(s): Abdominal Pain, Vomiting   Penicillins Swelling   Has patient had a PCN reaction causing immediate rash, facial/tongue/throat swelling, SOB or lightheadedness with hypotension patient had a PCN reaction causing severe rash involving mucus membranes or skin necrosis: [id number] Has patient had a PCN reaction that required  hospitalization/No Has patient had a PCN reaction occurring within the last 10 years: No If all of the above answers are "NO", then may proceed with Cephalosporin use.   Acetaminophen-codeine Nausea Only   Other reaction(s): Vomiting   Oxycodone-acetaminophen Nausea And Vomiting   Lyrica [pregabalin] Other (See Comments)   Blurred vision.   Nickel Rash   Phenytoin Sodium Extended Swelling   Vicodin [hydrocodone-acetaminophen] Nausea And Vomiting        Medication List    acetaminophen-codeine 300-30 MG tablet Commonly known as: TYLENOL #3 Take 1 tablet by mouth every 6 (six) hours as needed for pain.   Advair HFA 230-21 MCG/ACT inhaler Generic drug: fluticasone-salmeterol INHALE 2 PUFFS BY MOUTH TWICE DAILY TO PREVENT COUGH OR WHEEZING. RINSE MOUTH AFTER USE   albuterol 108 (90 Base) MCG/ACT inhaler Commonly known as: VENTOLIN HFA INHALE 2 PUFFS BY MOUTH EVERY 6 HOURS AS NEEDED FOR WHEEZING OR SHORTNESS OF BREATH   atorvastatin 10 MG tablet Commonly known as: LIPITOR TAKE 1 TABLET(10 MG) BY MOUTH DAILY   cyclobenzaprine 5 MG tablet Commonly known as: FLEXERIL Take 1-2 tablets (5-10 mg total) by mouth 3 (three) times daily as needed for muscle spasms.   DAIRY-RELIEF PO Take 1 capsule by mouth daily as needed (eating dairy).   dextromethorphan-guaiFENesin 30-600 MG 12hr tablet Commonly known as: MUCINEX DM Take 1 tablet by mouth 2 (two) times daily.   diclofenac Sodium 1 % Gel Commonly known as: Voltaren Apply 2 g topically 4 (four) times daily.   doxycycline 100 MG  capsule Commonly known as: VIBRAMYCIN Take 1 capsule (100 mg total) by mouth 2 (two) times daily.   EPINEPHrine 0.3 mg/0.3 mL Soaj injection Commonly known as: EPI-PEN USE AS DIRECTED FOR LIFE THREATENING ALLERGIC REACTIONS   famotidine 40 MG tablet Commonly known as: PEPCID TAKE 1 TABLET(40 MG) BY MOUTH DAILY   fluticasone 220 MCG/ACT inhaler Commonly known as: FLOVENT HFA 2 puffs 2 times  daily during Flare Up.   furosemide 20 MG tablet Commonly known as: LASIX Take 20 mg as needed for a weight gain of 3 pounds overnight or 5 pounds in one week,.   gabapentin 600 MG tablet Commonly known as: NEURONTIN TAKE 1 TABLET BY MOUTH EVERY DAY AT BREAKFAST, 1 TABLET AT LUNCH AND 2 TABLETS AT BEDTIME   Glyxambi 10-5 MG Tabs Generic drug: Empagliflozin-linaGLIPtin TAKE 1 TABLET BY MOUTH DAILY   hydrOXYzine 10 MG tablet Commonly known as: ATARAX Take 1 tablet (10 mg total) by mouth daily as needed for itching.   ipratropium 0.06 % nasal spray Commonly known as: ATROVENT USE 1 TO 2 SPRAYS IN EACH NOSTRIL 1 TO 2 TIMES PER DAY   irbesartan 150 MG tablet Commonly known as: AVAPRO Take 1 tablet (150 mg total) by mouth daily.   levalbuterol 1.25 MG/3ML nebulizer solution Commonly known as: XOPENEX USE 1 VIAL VIA NEBULIZER EVERY 6 HOURS AS NEEDED FOR SHORTNESS OF BREATH OR WHEEZING   liver oil-zinc oxide 40 % ointment Commonly known as: DESITIN Apply 1 application topically as needed for irritation.   methimazole 5 MG tablet Commonly known as: TAPAZOLE Take 1 tablet (5 mg total) by mouth daily.   montelukast 10 MG tablet Commonly known as: SINGULAIR Take 1 tablet (10 mg total) by mouth at bedtime. For asthma control.   NUTRITIONAL SUPPLEMENT PO Take by mouth. Omega XL - Take 2 gel capsules 2 times a day   nystatin 100000 UNIT/ML suspension Commonly known as: MYCOSTATIN 42m swish and swallow after Advair 2 times daily.   ondansetron 4 MG disintegrating tablet Commonly known as: ZOFRAN-ODT Take 1 tablet (4 mg total) by mouth every 8 (eight) hours as needed for nausea or vomiting.   ondansetron 8 MG disintegrating tablet Commonly known as: ZOFRAN-ODT Take 1 tablet (8 mg total) by mouth every 8 (eight) hours as needed for nausea or vomiting.   OneTouch Delica Plus [id number] USE TO CHECK BLOOD SUGAR UP TO TWICE DAILY   OneTouch Verio test strip Generic drug:  glucose blood USE TO CHECK BLOOD SUGAR UP TO TWICE DAILY   OneTouch Verio w/Device Kit Use to check blood sugar up to 2 times a day   oxyCODONE-acetaminophen 5-325 MG tablet Commonly known as: PERCOCET/ROXICET Take 1-2 tablets by mouth every 6 (six) hours as needed for severe pain.   pantoprazole 40 MG tablet Commonly known as: PROTONIX TAKE 1 TABLET(40 MG) BY MOUTH DAILY   Pataday 0.2 % Soln Generic drug: Olopatadine HCl Place 1 drop into both eyes daily as needed (allergies).   potassium chloride SA 20 MEQ tablet Commonly known as: KLOR-CON M Take 1 tablet (20 mEq total) by mouth as needed (Take one daily when you need to take a Furosemide).   SALONPAS JET SPRAY EX Apply 1 spray topically daily as needed (knee pain).   Spiriva Respimat 1.25 MCG/ACT Aers Generic drug: Tiotropium Bromide Monohydrate INHALE 2 PUFFS INTO THE LUNGS DAILY   SYSTANE OP Place 1 drop into both eyes in the morning and at bedtime.   TLa Plant  Apply 1 spray topically daily as needed (muscle cramps).   Trulance 3 MG Tabs Generic drug: Plecanatide Take 3 mg by mouth every other day.   Xarelto 20 MG Tabs tablet Generic drug: rivaroxaban TAKE 1 TABLET(20 MG) BY MOUTH DAILY WITH SUPPER    Past Medical History:  Diagnosis Date  . Allergic rhinitis   . Allergy   . Arthritis   . Asthma   . Chronic headache   . Diabetes mellitus   . DVT (deep venous thrombosis) ([place])   . Dyspnea   . Heart murmur   . Hypertension   . Penicillin allergy 01/19/2020  . Pulmonary embolism ([place])   . Pulmonary hypertension ([place])   . Seizures ([place])     Past Surgical History:  Procedure Laterality Date  . ABDOMINAL HYSTERECTOMY     partial, has ovaries  . CARDIAC CATHETERIZATION  12/28/2010   Mod. pulmonary hypertension, normal coronary arteries  . CARDIOVERSION N/A 11/23/2020   Procedure: CARDIOVERSION;  Surgeon: Sanda Klein, MD;  Location: [facility];  Service: Cardiovascular;  Laterality: N/A;   . DOPPLER ECHOCARDIOGRAPHY  10/08/2011   EF=>55%,mild asymmetric LVH, mod. TR, mod. PH, mild to mod LA dilatation  . IR LUMBAR DISC ASPIRATION W/IMG GUIDE  01/12/2020  . KNEE ARTHROSCOPY Left   . KNEE SURGERY    . Nuclear Stress Test  05/20/2006   No ischemia  . PARTIAL HYSTERECTOMY    . PLANTAR FASCIA SURGERY    . TONSILLECTOMY      Review of systems negative except as noted in HPI / PMHx or noted below:  Review of Systems  Constitutional: Negative.   HENT: Negative.    Eyes: Negative.   Respiratory: Negative.    Cardiovascular: Negative.   Gastrointestinal: Negative.   Genitourinary: Negative.   Musculoskeletal: Negative.   Skin: Negative.   Neurological: Negative.   Endo/Heme/Allergies: Negative.   Psychiatric/Behavioral: Negative.      Objective:   Vitals:   12/12/21 1135  BP: 100/60  Pulse: 64  Resp: 16  Temp: 98.3 F (36.8 C)  SpO2: 95%   Height: '5\' 4"'  (162.6 cm)  Weight: 184 lb (83.5 kg)   Physical Exam Constitutional:      Appearance: She is not diaphoretic.  HENT:     Head: Normocephalic.     Right Ear: Tympanic membrane, ear canal and external ear normal.     Left Ear: Tympanic membrane, ear canal and external ear normal.     Nose: Nose normal. No mucosal edema or rhinorrhea.     Mouth/Throat:     Pharynx: Uvula midline. No oropharyngeal exudate.  Eyes:     Conjunctiva/sclera: Conjunctivae normal.  Neck:     Thyroid: No thyromegaly.     Trachea: Trachea normal. No tracheal tenderness or tracheal deviation.  Cardiovascular:     Rate and Rhythm: Normal rate and regular rhythm.     Heart sounds: Normal heart sounds, S1 normal and S2 normal. No murmur heard. Pulmonary:     Effort: No respiratory distress.     Breath sounds: Normal breath sounds. No stridor. No wheezing or rales.  Lymphadenopathy:     Head:     Right side of head: No tonsillar adenopathy.     Left side of head: No tonsillar adenopathy.     Cervical: No cervical adenopathy.   Skin:    Findings: No erythema or rash.     Nails: There is no clubbing.  Neurological:     Mental Status: She is  alert.    Diagnostics:    Spirometry was performed and demonstrated an FEV1 of 1.55 at 88 % of predicted.  Assessment and Plan:   1. Severe persistent asthma without complication   2. Asthma, severe persistent, well-controlled   3. Other allergic rhinitis   4. Adverse food reaction, subsequent encounter   5. LPRD (laryngopharyngeal reflux disease)     1. Continue Advair 230 - 2 inhalations 1-2 times a day depending on disease activity  2. Continue Spiriva 2.5 respimat - 1 inhalations 1 time per day.  3. Continue Montelukast 11m - 1 tablet 1 time per day.  4. Continue Xolair (and Epi-Pen)  5. Continue pantoprazole 440min the AM and Famotidine 40 mg in the PM.    6. START OTC NASACORT - 1 spray each nostril 1 time per day (takes days to work)  7. If needed:   A. Nasal saline several times per day  B. Mucinex DM two times per day  C. Ipratropium 0.06% -1-2 sprays each nostril 1-2 times per day to dry nose  D. Proventil HFA or albuterol nebulization  E. nystatin 5 mL swish and swallow after Advair 2 times a day  8. Add Flovent 220 - 2 inhalations 2 times a day during "FLARE UP"  9. Return to clinic in 6 months or earlier if there is a problem.  MoMerrillynppears to be doing very well regarding her asthma and she will continue on her current combination of therapy including the use of omalizumab.  Unwilling to give her a low-dose of nasal steroids.  In the past she has had problems with Flonase giving rise to epistaxis.  Hopefully a low-dose of Nasacort will give her relief regarding her rhinitis without the development of epistaxis.  She has a selection of other agents that she can utilize should they be required.  Her blood test did not identify any IgE antibodies directed against physician she can now consume fish.  Likewise, they have not identified any  antibodies against shellfish and she should be fine consuming shellfish at this point.  I will see her back in this clinic in 6 months or earlier if there is a problem.  ErAllena KatzMD [facility] [place]

## 2021-12-12 NOTE — Progress Notes (Signed)
Patient ID: [name], female    DOB: May 09, 1946, 77 y.o.   MRN: [id number]  This visit was conducted in person.  BP 102/62 (BP Location: Left Arm, Patient Position: Sitting, Cuff Size: Normal)   Pulse 91   Temp 98.5 F (36.9 C) (Oral)   Ht '5\' 4"'  (1.626 m)   Wt 184 lb 9.6 oz (83.7 kg)   SpO2 98%   BMI 31.69 kg/m    CC:  Chief Complaint  Patient presents with   Vaginal Itching    Subjective:   HPI: [name] is a 76 y.o. female presenting on 12/12/2021 for Vaginal Itching    She has been having vaginal itching and burning  externally last 2 weeks.  No discharge, no  odor.  Notes most when she takes a bath.   Had tried Desitin .Marland Kitchen no relief. No OTC treatments.  She had antibiotics 1.5  month.        Relevant past medical, surgical, family and social history reviewed and updated as indicated. Interim medical history since our last visit reviewed. Allergies and medications reviewed and updated. Outpatient Medications Prior to Visit  Medication Sig Dispense Refill   Acetaminophen-Codeine 300-30 MG tablet Take 1 tablet by mouth every 6 (six) hours as needed for pain. (Patient taking differently: Take by mouth every 6 (six) hours as needed for pain.) 30 tablet 0   ADVAIR HFA 230-21 MCG/ACT inhaler INHALE 2 PUFFS BY MOUTH TWICE DAILY TO PREVENT COUGH OR WHEEZING. RINSE MOUTH AFTER USE 12 g 5   albuterol (VENTOLIN HFA) 108 (90 Base) MCG/ACT inhaler INHALE 2 PUFFS BY MOUTH EVERY 6 HOURS AS NEEDED FOR WHEEZING OR SHORTNESS OF BREATH 54 g 0   atorvastatin (LIPITOR) 10 MG tablet TAKE 1 TABLET(10 MG) BY MOUTH DAILY 90 tablet 3   Blood Glucose Monitoring Suppl (ONETOUCH VERIO) w/Device KIT Use to check blood sugar up to 2 times a day 1 kit 0   cyclobenzaprine (FLEXERIL) 5 MG tablet Take 1-2 tablets (5-10 mg total) by mouth 3 (three) times daily as needed for muscle spasms. 20 tablet 0   dextromethorphan-guaiFENesin (MUCINEX DM) 30-600 MG 12hr tablet Take 1 tablet by mouth 2  (two) times daily.     diclofenac Sodium (VOLTAREN) 1 % GEL Apply 2 g topically 4 (four) times daily. 400 g 1   EPINEPHrine 0.3 mg/0.3 mL IJ SOAJ injection USE AS DIRECTED FOR LIFE THREATENING ALLERGIC REACTIONS 2 each 2   famotidine (PEPCID) 40 MG tablet TAKE 1 TABLET(40 MG) BY MOUTH DAILY 30 tablet 5   fluticasone (FLOVENT HFA) 220 MCG/ACT inhaler 2 puffs 2 times daily during Flare Up. 12 g 5   furosemide (LASIX) 20 MG tablet Take 20 mg as needed for a weight gain of 3 pounds overnight or 5 pounds in one week,. 30 tablet 3   gabapentin (NEURONTIN) 600 MG tablet TAKE 1 TABLET BY MOUTH EVERY DAY AT BREAKFAST, 1 TABLET AT LUNCH AND 2 TABLETS AT BEDTIME 360 tablet 1   GLYXAMBI 10-5 MG TABS TAKE 1 TABLET BY MOUTH DAILY 30 tablet 5   Homeopathic Products (THERAWORX RELIEF EX) Apply 1 spray topically daily as needed (muscle cramps).     HYDROmorphone (DILAUDID) 2 MG tablet Take 2 mg by mouth every 4 (four) hours as needed for severe pain.     hydrOXYzine (ATARAX/VISTARIL) 10 MG tablet Take 1 tablet (10 mg total) by mouth daily as needed for itching. 30 tablet 2   ipratropium (ATROVENT) 0.06 %  nasal spray USE 1 TO 2 SPRAYS IN EACH NOSTRIL 1 TO 2 TIMES PER DAY 15 mL 5   irbesartan (AVAPRO) 150 MG tablet Take 1 tablet (150 mg total) by mouth daily. 90 tablet 2   Lactase (DAIRY-RELIEF PO) Take 1 capsule by mouth daily as needed (eating dairy).      Lancets (ONETOUCH DELICA PLUS [id number]) MISC USE TO CHECK BLOOD SUGAR UP TO TWICE DAILY 100 each 5   levalbuterol (XOPENEX) 1.25 MG/3ML nebulizer solution USE 1 VIAL VIA NEBULIZER EVERY 6 HOURS AS NEEDED FOR SHORTNESS OF BREATH OR WHEEZING 900 mL 1   liver oil-zinc oxide (DESITIN) 40 % ointment Apply 1 application topically as needed for irritation.     Menthol-Methyl Salicylate (SALONPAS JET SPRAY EX) Apply 1 spray topically daily as needed (knee pain).     methimazole (TAPAZOLE) 5 MG tablet Take 1 tablet (5 mg total) by mouth daily. 90 tablet 3   montelukast  (SINGULAIR) 10 MG tablet Take 1 tablet (10 mg total) by mouth at bedtime. For asthma control. 90 tablet 1   Nutritional Supplements (NUTRITIONAL SUPPLEMENT PO) Take by mouth. Omega XL - Take 2 gel capsules 2 times a day     nystatin (MYCOSTATIN) 100000 UNIT/ML suspension 104m swish and swallow after Advair 2 times daily. 300 mL 5   ondansetron (ZOFRAN-ODT) 4 MG disintegrating tablet Take 1 tablet (4 mg total) by mouth every 8 (eight) hours as needed for nausea or vomiting. 20 tablet 0   ondansetron (ZOFRAN-ODT) 8 MG disintegrating tablet Take 1 tablet (8 mg total) by mouth every 8 (eight) hours as needed for nausea or vomiting. 20 tablet 0   ONETOUCH VERIO test strip USE TO CHECK BLOOD SUGAR UP TO TWICE DAILY 100 strip 5   oxyCODONE-acetaminophen (PERCOCET/ROXICET) 5-325 MG tablet Take 1-2 tablets by mouth every 6 (six) hours as needed for severe pain. 15 tablet 0   pantoprazole (PROTONIX) 40 MG tablet TAKE 1 TABLET(40 MG) BY MOUTH DAILY 90 tablet 1   PATADAY 0.2 % SOLN Place 1 drop into both eyes daily as needed (allergies).   4   Polyethyl Glycol-Propyl Glycol (SYSTANE OP) Place 1 drop into both eyes in the morning and at bedtime.     potassium chloride SA (KLOR-CON) 20 MEQ tablet Take 1 tablet (20 mEq total) by mouth as needed (Take one daily when you need to take a Furosemide). 30 tablet 0   SPIRIVA RESPIMAT 1.25 MCG/ACT AERS INHALE 2 PUFFS INTO THE LUNGS DAILY 4 g 5   TRULANCE 3 MG TABS Take 3 mg by mouth every other day.     XARELTO 20 MG TABS tablet TAKE 1 TABLET(20 MG) BY MOUTH DAILY WITH SUPPER 90 tablet 1   doxycycline (VIBRAMYCIN) 100 MG capsule Take 1 capsule (100 mg total) by mouth 2 (two) times daily. 20 capsule 0   Facility-Administered Medications Prior to Visit  Medication Dose Route Frequency Provider Last Rate Last Admin   omalizumab (Arvid Right injection 300 mg  300 mg Subcutaneous Q28 days Kozlow, EDonnamarie Poag MD   300 mg at 12/12/21 1042     Per HPI unless specifically indicated in  ROS section below Review of Systems  Constitutional:  Negative for fatigue and fever.  HENT:  Negative for ear pain.   Eyes:  Negative for pain.  Respiratory:  Negative for chest tightness and shortness of breath.   Cardiovascular:  Negative for chest pain, palpitations and leg swelling.  Gastrointestinal:  Negative for abdominal pain.  Genitourinary:  Negative for dysuria.   Objective:  BP 102/62 (BP Location: Left Arm, Patient Position: Sitting, Cuff Size: Normal)   Pulse 91   Temp 98.5 F (36.9 C) (Oral)   Ht '5\' 4"'  (1.626 m)   Wt 184 lb 9.6 oz (83.7 kg)   SpO2 98%   BMI 31.69 kg/m   Wt Readings from Last 3 Encounters:  12/12/21 184 lb 9.6 oz (83.7 kg)  12/12/21 184 lb (83.5 kg)  11/03/21 181 lb 12.8 oz (82.5 kg)      Physical Exam Constitutional:      General: She is not in acute distress.    Appearance: Normal appearance. She is well-developed. She is not ill-appearing or toxic-appearing.  HENT:     Head: Normocephalic.     Right Ear: Hearing, tympanic membrane, ear canal and external ear normal. Tympanic membrane is not erythematous, retracted or bulging.     Left Ear: Hearing, tympanic membrane, ear canal and external ear normal. Tympanic membrane is not erythematous, retracted or bulging.     Nose: No mucosal edema or rhinorrhea.     Right Sinus: No maxillary sinus tenderness or frontal sinus tenderness.     Left Sinus: No maxillary sinus tenderness or frontal sinus tenderness.     Mouth/Throat:     Pharynx: Uvula midline.  Eyes:     General: Lids are normal. Lids are everted, no foreign bodies appreciated.     Conjunctiva/sclera: Conjunctivae normal.     Pupils: Pupils are equal, round, and reactive to light.  Neck:     Thyroid: No thyroid mass or thyromegaly.     Vascular: No carotid bruit.     Trachea: Trachea normal.  Cardiovascular:     Rate and Rhythm: Normal rate and regular rhythm.     Pulses: Normal pulses.     Heart sounds: Normal heart sounds, S1  normal and S2 normal. No murmur heard.    No friction rub. No gallop.  Pulmonary:     Effort: Pulmonary effort is normal. No tachypnea or respiratory distress.     Breath sounds: Normal breath sounds. No decreased breath sounds, wheezing, rhonchi or rales.  Abdominal:     General: Bowel sounds are normal.     Palpations: Abdomen is soft.     Tenderness: There is no abdominal tenderness.  Genitourinary:    Labia:        Right: Rash present.        Left: Rash present.      Comments: That you have erythema and dryness no discharge Musculoskeletal:     Cervical back: Normal range of motion and neck supple.  Skin:    General: Skin is warm and dry.     Findings: No rash.  Neurological:     Mental Status: She is alert.  Psychiatric:        Mood and Affect: Mood is not anxious or depressed.        Speech: Speech normal.        Behavior: Behavior normal. Behavior is cooperative.        Thought Content: Thought content normal.        Judgment: Judgment normal.       Results for orders placed or performed in visit on 11/03/21  POCT HgB A1C  Result Value Ref Range   Hemoglobin A1C 5.3 4.0 - 5.6 %   HbA1c POC (<> result, manual entry)     HbA1c, POC (prediabetic range)  HbA1c, POC (controlled diabetic range)     *Note: Due to a large number of results and/or encounters for the requested time period, some results have not been displayed. A complete set of results can be found in Results Review.     COVID 19 screen:  No recent travel or known exposure to COVID19 The patient denies respiratory symptoms of COVID 19 at this time. The importance of social distancing was discussed today.   Assessment and Plan    Problem List Items Addressed This Visit     Vaginal burning - Primary    Acute  Minimal discharge.  We will send out for wet prep to rule out bacterial infection or yeast infection but I think this is less likely.  Changes are most likely secondary to wearing depends with  contact dermatitis as well as potentially with postmenopausal vaginal atrophy.  Start topical steroid cream Cortisone 10 twice daily on irritated skin.       Relevant Orders   WET PREP BY MOLECULAR PROBE (Completed)     [name], MD

## 2021-12-12 NOTE — Telephone Encounter (Signed)
I spoke with pt and she did take abx back in April. Pt said vaginal and perineal burning and itching that started 2 wks ago. Pt has been using Desitin but that has not helped. No vaginal discharge. Pt scheduled appt with Dr Diona Browner 12/12/21 at 4 PM sending note to Dr Diona Browner and [name] CMA.

## 2021-12-13 ENCOUNTER — Encounter: Payer: Self-pay | Admitting: Allergy and Immunology

## 2021-12-13 LAB — WET PREP BY MOLECULAR PROBE
Candida species: NOT DETECTED
Gardnerella vaginalis: NOT DETECTED
MICRO NUMBER:: 13458408
SPECIMEN QUALITY:: ADEQUATE
Trichomonas vaginosis: NOT DETECTED

## 2021-12-14 DIAGNOSIS — Z471 Aftercare following joint replacement surgery: Secondary | ICD-10-CM | POA: Diagnosis not present

## 2021-12-14 DIAGNOSIS — Z96651 Presence of right artificial knee joint: Secondary | ICD-10-CM | POA: Diagnosis not present

## 2021-12-15 ENCOUNTER — Ambulatory Visit (INDEPENDENT_AMBULATORY_CARE_PROVIDER_SITE_OTHER): Payer: Medicare Other | Admitting: Internal Medicine

## 2021-12-15 ENCOUNTER — Telehealth: Payer: Self-pay | Admitting: Family Medicine

## 2021-12-15 ENCOUNTER — Encounter: Payer: Self-pay | Admitting: Internal Medicine

## 2021-12-15 DIAGNOSIS — M25571 Pain in right ankle and joints of right foot: Secondary | ICD-10-CM | POA: Insufficient documentation

## 2021-12-15 NOTE — Assessment & Plan Note (Signed)
Findings suggestive of mild sprain Had been using cane more--instead of walking (but doesn't remember injury) Nothing to suggest DVT and is already on xarelto full dose Calf findings are also not suspicious--no increased swelling, just some pain Reassured--will observe only (and she will continue to use the walker)

## 2021-12-15 NOTE — Telephone Encounter (Signed)
Please triage.  We did not see her for her ankle.

## 2021-12-15 NOTE — Telephone Encounter (Signed)
See my OV note

## 2021-12-15 NOTE — Telephone Encounter (Signed)
I spoke with pt; pt said on 12/12/21 her rt ankle began with a dull throbbing pain and swelling and pt thought she may have slept wrong or twisted her ankle while she was sleeping. Now on and off the rt ankle pain is radiating into the rt calf; pain level is 7 - 8. The rt calf is also swollen. No redness seen and no increased warmth felt. No CP or SOB. Pt wants to be seen due to pain and the pain spreading into lower rt leg.pt said had rt total knee replacement done in March 2023 and rt knee is still swollen from that surgery. UC & ED precautions given and pt voiced understanding. Pt scheduled appt with Dr Silvio Pate 12/15/21 at 12 noon. Sending note to Dr Silvio Pate and Larene Beach CMA and [name] CMA.

## 2021-12-15 NOTE — Telephone Encounter (Signed)
Pt called and said she was seen on 12/12/21 and she said her right ank is bothering her still and that she was wondering if a mucsle relaxer could be called in for her. I offered appt because she said she didn't mention it in her appt she was seen at and she said she would just like a call back from [place]

## 2021-12-15 NOTE — Progress Notes (Signed)
Subjective:    Patient ID: [name], female    DOB: 06/11/1946, 76 y.o.   MRN: [id number]  HPI Here due to right calf/ankle pain  Having pain and swelling in right ankle ---lateral side First noticed it 3 days ago--thought it was due to sleeping wrong Pain has worsened--and now in calf since yesterday  Continuing right calf swelling since TKR in March (but hadn't been sore) Saw ortho yesterday---wasn't too concerned about the ankle swelling Calf pain since then Did soak ankle and elevated it then (also some ice)  Current Outpatient Medications on File Prior to Visit  Medication Sig Dispense Refill   Acetaminophen-Codeine 300-30 MG tablet Take 1 tablet by mouth every 6 (six) hours as needed for pain. (Patient taking differently: Take by mouth every 6 (six) hours as needed for pain.) 30 tablet 0   albuterol (VENTOLIN HFA) 108 (90 Base) MCG/ACT inhaler INHALE 2 PUFFS BY MOUTH EVERY 6 HOURS AS NEEDED FOR WHEEZING OR SHORTNESS OF BREATH 18 g 1   atorvastatin (LIPITOR) 10 MG tablet TAKE 1 TABLET(10 MG) BY MOUTH DAILY 90 tablet 3   Blood Glucose Monitoring Suppl (ONETOUCH VERIO) w/Device KIT Use to check blood sugar up to 2 times a day 1 kit 0   cyclobenzaprine (FLEXERIL) 5 MG tablet Take 1-2 tablets (5-10 mg total) by mouth 3 (three) times daily as needed for muscle spasms. 20 tablet 0   dextromethorphan-guaiFENesin (MUCINEX DM) 30-600 MG 12hr tablet Take 1 tablet by mouth 2 (two) times daily.     diclofenac Sodium (VOLTAREN) 1 % GEL Apply 2 g topically 4 (four) times daily. 400 g 1   EPINEPHrine 0.3 mg/0.3 mL IJ SOAJ injection USE AS DIRECTED FOR LIFE THREATENING ALLERGIC REACTIONS 2 each 2   famotidine (PEPCID) 40 MG tablet Take 1 (ONE) tablet by mouth every evening. 90 tablet 1   fluticasone (FLOVENT HFA) 220 MCG/ACT inhaler 2 puffs 2 times daily during Flare Up. 12 g 5   fluticasone-salmeterol (ADVAIR HFA) 230-21 MCG/ACT inhaler INHALE 2 PUFFS BY MOUTH TWICE DAILY TO PREVENT COUGH  OR WHEEZING. RINSE MOUTH AFTER USE 12 g 5   furosemide (LASIX) 20 MG tablet Take 20 mg as needed for a weight gain of 3 pounds overnight or 5 pounds in one week,. 30 tablet 3   gabapentin (NEURONTIN) 600 MG tablet TAKE 1 TABLET BY MOUTH EVERY DAY AT BREAKFAST, 1 TABLET AT LUNCH AND 2 TABLETS AT BEDTIME 360 tablet 1   GLYXAMBI 10-5 MG TABS TAKE 1 TABLET BY MOUTH DAILY 30 tablet 5   Homeopathic Products (THERAWORX RELIEF EX) Apply 1 spray topically daily as needed (muscle cramps).     HYDROmorphone (DILAUDID) 2 MG tablet Take 2 mg by mouth every 4 (four) hours as needed for severe pain.     hydrOXYzine (ATARAX/VISTARIL) 10 MG tablet Take 1 tablet (10 mg total) by mouth daily as needed for itching. 30 tablet 2   ipratropium (ATROVENT) 0.06 % nasal spray 1-2 sprays in each nostril 1-2 times er day as needed to dry nose 15 mL 5   irbesartan (AVAPRO) 150 MG tablet Take 1 tablet (150 mg total) by mouth daily. 90 tablet 2   Lactase (DAIRY-RELIEF PO) Take 1 capsule by mouth daily as needed (eating dairy).      Lancets (ONETOUCH DELICA PLUS LANCET33G) MISC USE TO CHECK BLOOD SUGAR UP TO TWICE DAILY 100 each 5   levalbuterol (XOPENEX) 1.25 MG/3ML nebulizer solution USE 1 VIAL VIA NEBULIZER EVERY 6  HOURS AS NEEDED FOR SHORTNESS OF BREATH OR WHEEZING 900 mL 1   liver oil-zinc oxide (DESITIN) 40 % ointment Apply 1 application topically as needed for irritation.     Menthol-Methyl Salicylate (SALONPAS JET SPRAY EX) Apply 1 spray topically daily as needed (knee pain).     methimazole (TAPAZOLE) 5 MG tablet Take 1 tablet (5 mg total) by mouth daily. 90 tablet 3   montelukast (SINGULAIR) 10 MG tablet Take 1 tablet (10 mg total) by mouth at bedtime. For asthma control. 90 tablet 1   Nutritional Supplements (NUTRITIONAL SUPPLEMENT PO) Take by mouth. Omega XL - Take 2 gel capsules 2 times a day     nystatin (MYCOSTATIN) 100000 UNIT/ML suspension 18mL swish and swallow after Advair 2 times daily. 473 mL 5   ondansetron  (ZOFRAN-ODT) 4 MG disintegrating tablet Take 1 tablet (4 mg total) by mouth every 8 (eight) hours as needed for nausea or vomiting. 20 tablet 0   ondansetron (ZOFRAN-ODT) 8 MG disintegrating tablet Take 1 tablet (8 mg total) by mouth every 8 (eight) hours as needed for nausea or vomiting. 20 tablet 0   ONETOUCH VERIO test strip USE TO CHECK BLOOD SUGAR UP TO TWICE DAILY 100 strip 5   oxybutynin (DITROPAN-XL) 10 MG 24 hr tablet Take 10 mg by mouth daily.     oxyCODONE-acetaminophen (PERCOCET/ROXICET) 5-325 MG tablet Take 1-2 tablets by mouth every 6 (six) hours as needed for severe pain. 15 tablet 0   pantoprazole (PROTONIX) 40 MG tablet Take 1 (ONE) tablet by mouth every morning. 90 tablet 1   PATADAY 0.2 % SOLN Place 1 drop into both eyes daily as needed (allergies).   4   Polyethyl Glycol-Propyl Glycol (SYSTANE OP) Place 1 drop into both eyes in the morning and at bedtime.     potassium chloride SA (KLOR-CON) 20 MEQ tablet Take 1 tablet (20 mEq total) by mouth as needed (Take one daily when you need to take a Furosemide). 30 tablet 0   Tiotropium Bromide Monohydrate (SPIRIVA RESPIMAT) 1.25 MCG/ACT AERS Inhale 2 puffs into the lungs daily. 4 g 5   TRULANCE 3 MG TABS Take 3 mg by mouth every other day.     XARELTO 20 MG TABS tablet TAKE 1 TABLET(20 MG) BY MOUTH DAILY WITH SUPPER 90 tablet 1   Current Facility-Administered Medications on File Prior to Visit  Medication Dose Route Frequency Provider Last Rate Last Admin   omalizumab Arvid Right) injection 300 mg  300 mg Subcutaneous Q28 days Kozlow, Donnamarie Poag, MD   300 mg at 12/12/21 1042    Allergies  Allergen Reactions   Latex Hives   Oxycodone Nausea Only and Nausea And Vomiting    Other reaction(s): Abdominal Pain, Vomiting   Penicillins Swelling    Has patient had a PCN reaction causing immediate rash, facial/tongue/throat swelling, SOB or lightheadedness with hypotension patient had a PCN reaction causing severe rash involving mucus membranes  or skin necrosis: [id number] Has patient had a PCN reaction that required hospitalization/No Has patient had a PCN reaction occurring within the last 10 years: No If all of the above answers are "NO", then may proceed with Cephalosporin use.      Acetaminophen-Codeine Nausea Only    Other reaction(s): Vomiting   Oxycodone-Acetaminophen Nausea And Vomiting   Lyrica [Pregabalin] Other (See Comments)    Blurred vision.   Nickel Rash   Phenytoin Sodium Extended Swelling   Vicodin [Hydrocodone-Acetaminophen] Nausea And Vomiting    Past Medical History:  Diagnosis Date   Allergic rhinitis    Allergy    Arthritis    Asthma    Chronic headache    Diabetes mellitus    DVT (deep venous thrombosis) (HCC)    Dyspnea    Heart murmur    Hypertension    Penicillin allergy 01/19/2020   Pulmonary embolism (HCC)    Pulmonary hypertension (HCC)    Seizures (HCC)     Past Surgical History:  Procedure Laterality Date   ABDOMINAL HYSTERECTOMY     partial, has ovaries   CARDIAC CATHETERIZATION  12/28/2010   Mod. pulmonary hypertension, normal coronary arteries   CARDIOVERSION N/A 11/23/2020   Procedure: CARDIOVERSION;  Surgeon: Sanda Klein, MD;  Location: [facility];  Service: Cardiovascular;  Laterality: N/A;   DOPPLER ECHOCARDIOGRAPHY  10/08/2011   EF=>55%,mild asymmetric LVH, mod. TR, mod. PH, mild to mod LA dilatation   IR LUMBAR DISC ASPIRATION W/IMG GUIDE  01/12/2020   KNEE ARTHROSCOPY Left    KNEE SURGERY     Nuclear Stress Test  05/20/2006   No ischemia   PARTIAL HYSTERECTOMY     PLANTAR FASCIA SURGERY     TONSILLECTOMY      Family History  Problem Relation Age of Onset   Hypertension Mother    Clotting disorder Mother    Breast cancer Mother    Arthritis Mother    Stroke Mother    Diabetes Mother    Cancer Brother    Alcohol abuse Father    Arthritis Sister    Diabetes Sister    Multiple sclerosis Sister    Allergies Other        grandson   Allergic rhinitis  Neg Hx    Angioedema Neg Hx    Asthma Neg Hx    Atopy Neg Hx    Eczema Neg Hx    Immunodeficiency Neg Hx    Urticaria Neg Hx     Social History   Socioeconomic History   Marital status: Widowed    Spouse name: Not on file   Number of children: Y   Years of education: Not on file   Highest education level: Not on file  Occupational History   Occupation: retired Geologist, engineering.   Tobacco Use   Smoking status: Never   Smokeless tobacco: Never  Vaping Use   Vaping Use: Never used  Substance and Sexual Activity   Alcohol use: Not Currently    Alcohol/week: 0.0 standard drinks    Comment: occ glass on wine   Drug use: No   Sexual activity: Never  Other Topics Concern   Not on file  Social History Narrative   Widow    limited exercise.   Social Determinants of Health   Financial Resource Strain: Low Risk    Difficulty of Paying Living Expenses: Not very hard  Food Insecurity: Not on file  Transportation Needs: Not on file  Physical Activity: Not on file  Stress: Not on file  Social Connections: Not on file  Intimate Partner Violence: Not on file   Review of Systems No chest pain  No SOB      Objective:   Physical Exam Constitutional:      Appearance: Normal appearance.  Musculoskeletal:     Comments: Right ankle---no malleolar tenderness Puffiness and tenderness under lateral malleolus. Pain with rotation at ankle  Loose swelling of upper right calf. Slight tender area but no cords of suspicious findings  Neurological:     Mental Status: She is alert.  Assessment & Plan:

## 2021-12-18 ENCOUNTER — Inpatient Hospital Stay (HOSPITAL_COMMUNITY)
Admission: EM | Admit: 2021-12-18 | Discharge: 2021-12-23 | DRG: 854 | Disposition: A | Discharge status: Home / Self Care | Payer: Medicare Other | Attending: Internal Medicine | Admitting: Internal Medicine

## 2021-12-18 ENCOUNTER — Encounter (HOSPITAL_COMMUNITY): Payer: Self-pay

## 2021-12-18 ENCOUNTER — Other Ambulatory Visit: Payer: Self-pay

## 2021-12-18 ENCOUNTER — Telehealth: Payer: Self-pay

## 2021-12-18 ENCOUNTER — Emergency Department (HOSPITAL_COMMUNITY): Payer: Medicare Other

## 2021-12-18 DIAGNOSIS — Z91048 Other nonmedicinal substance allergy status: Secondary | ICD-10-CM

## 2021-12-18 DIAGNOSIS — Z86711 Personal history of pulmonary embolism: Secondary | ICD-10-CM | POA: Diagnosis not present

## 2021-12-18 DIAGNOSIS — E1140 Type 2 diabetes mellitus with diabetic neuropathy, unspecified: Secondary | ICD-10-CM | POA: Diagnosis present

## 2021-12-18 DIAGNOSIS — Z888 Allergy status to other drugs, medicaments and biological substances status: Secondary | ICD-10-CM

## 2021-12-18 DIAGNOSIS — Z79899 Other long term (current) drug therapy: Secondary | ICD-10-CM | POA: Diagnosis not present

## 2021-12-18 DIAGNOSIS — I480 Paroxysmal atrial fibrillation: Secondary | ICD-10-CM | POA: Diagnosis not present

## 2021-12-18 DIAGNOSIS — K8000 Calculus of gallbladder with acute cholecystitis without obstruction: Secondary | ICD-10-CM | POA: Diagnosis not present

## 2021-12-18 DIAGNOSIS — Z6833 Body mass index (BMI) 33.0-33.9, adult: Secondary | ICD-10-CM | POA: Diagnosis not present

## 2021-12-18 DIAGNOSIS — K5900 Constipation, unspecified: Secondary | ICD-10-CM | POA: Diagnosis present

## 2021-12-18 DIAGNOSIS — Z7951 Long term (current) use of inhaled steroids: Secondary | ICD-10-CM

## 2021-12-18 DIAGNOSIS — R109 Unspecified abdominal pain: Secondary | ICD-10-CM | POA: Diagnosis not present

## 2021-12-18 DIAGNOSIS — E669 Obesity, unspecified: Secondary | ICD-10-CM | POA: Diagnosis present

## 2021-12-18 DIAGNOSIS — E0590 Thyrotoxicosis, unspecified without thyrotoxic crisis or storm: Secondary | ICD-10-CM | POA: Diagnosis not present

## 2021-12-18 DIAGNOSIS — G4733 Obstructive sleep apnea (adult) (pediatric): Secondary | ICD-10-CM | POA: Diagnosis present

## 2021-12-18 DIAGNOSIS — Z8249 Family history of ischemic heart disease and other diseases of the circulatory system: Secondary | ICD-10-CM

## 2021-12-18 DIAGNOSIS — K8010 Calculus of gallbladder with chronic cholecystitis without obstruction: Secondary | ICD-10-CM | POA: Diagnosis not present

## 2021-12-18 DIAGNOSIS — Z880 Allergy status to penicillin: Secondary | ICD-10-CM | POA: Diagnosis not present

## 2021-12-18 DIAGNOSIS — I272 Pulmonary hypertension, unspecified: Secondary | ICD-10-CM | POA: Diagnosis present

## 2021-12-18 DIAGNOSIS — K82A1 Gangrene of gallbladder in cholecystitis: Secondary | ICD-10-CM | POA: Diagnosis present

## 2021-12-18 DIAGNOSIS — K819 Cholecystitis, unspecified: Principal | ICD-10-CM

## 2021-12-18 DIAGNOSIS — J4540 Moderate persistent asthma, uncomplicated: Secondary | ICD-10-CM | POA: Diagnosis present

## 2021-12-18 DIAGNOSIS — Z86718 Personal history of other venous thrombosis and embolism: Secondary | ICD-10-CM

## 2021-12-18 DIAGNOSIS — R000 Tachycardia, unspecified: Secondary | ICD-10-CM | POA: Diagnosis not present

## 2021-12-18 DIAGNOSIS — K810 Acute cholecystitis: Secondary | ICD-10-CM | POA: Insufficient documentation

## 2021-12-18 DIAGNOSIS — Z91040 Latex allergy status: Secondary | ICD-10-CM

## 2021-12-18 DIAGNOSIS — I10 Essential (primary) hypertension: Secondary | ICD-10-CM | POA: Diagnosis not present

## 2021-12-18 DIAGNOSIS — D72829 Elevated white blood cell count, unspecified: Secondary | ICD-10-CM

## 2021-12-18 DIAGNOSIS — Z7901 Long term (current) use of anticoagulants: Secondary | ICD-10-CM | POA: Diagnosis not present

## 2021-12-18 DIAGNOSIS — A419 Sepsis, unspecified organism: Principal | ICD-10-CM | POA: Diagnosis present

## 2021-12-18 DIAGNOSIS — K828 Other specified diseases of gallbladder: Secondary | ICD-10-CM | POA: Diagnosis not present

## 2021-12-18 DIAGNOSIS — Z833 Family history of diabetes mellitus: Secondary | ICD-10-CM

## 2021-12-18 DIAGNOSIS — K8020 Calculus of gallbladder without cholecystitis without obstruction: Secondary | ICD-10-CM | POA: Diagnosis not present

## 2021-12-18 DIAGNOSIS — E119 Type 2 diabetes mellitus without complications: Secondary | ICD-10-CM | POA: Diagnosis not present

## 2021-12-18 DIAGNOSIS — I4891 Unspecified atrial fibrillation: Secondary | ICD-10-CM | POA: Diagnosis not present

## 2021-12-18 DIAGNOSIS — R1013 Epigastric pain: Secondary | ICD-10-CM | POA: Diagnosis not present

## 2021-12-18 DIAGNOSIS — K219 Gastro-esophageal reflux disease without esophagitis: Secondary | ICD-10-CM | POA: Diagnosis not present

## 2021-12-18 DIAGNOSIS — J449 Chronic obstructive pulmonary disease, unspecified: Secondary | ICD-10-CM | POA: Diagnosis not present

## 2021-12-18 DIAGNOSIS — Z885 Allergy status to narcotic agent status: Secondary | ICD-10-CM

## 2021-12-18 DIAGNOSIS — E1159 Type 2 diabetes mellitus with other circulatory complications: Secondary | ICD-10-CM | POA: Diagnosis present

## 2021-12-18 LAB — COMPREHENSIVE METABOLIC PANEL
ALT: 9 U/L (ref 0–44)
AST: 22 U/L (ref 15–41)
Albumin: 3.4 g/dL — ABNORMAL LOW (ref 3.5–5.0)
Alkaline Phosphatase: 74 U/L (ref 38–126)
Anion gap: 6 (ref 5–15)
BUN: 12 mg/dL (ref 8–23)
CO2: 24 mmol/L (ref 22–32)
Calcium: 8.1 mg/dL — ABNORMAL LOW (ref 8.9–10.3)
Chloride: 112 mmol/L — ABNORMAL HIGH (ref 98–111)
Creatinine, Ser: 0.84 mg/dL (ref 0.44–1.00)
GFR, Estimated: >60 mL/min (ref >60)
Glucose, Bld: 121 mg/dL — ABNORMAL HIGH (ref 70–99)
Potassium: 3.8 mmol/L (ref 3.5–5.1)
Sodium: 142 mmol/L (ref 135–145)
Total Bilirubin: 0.7 mg/dL (ref 0.3–1.2)
Total Protein: 6.3 g/dL — ABNORMAL LOW (ref 6.5–8.1)

## 2021-12-18 LAB — LIPASE, BLOOD: Lipase: 23 U/L (ref 11–51)

## 2021-12-18 LAB — CBC
HCT: 36.1 % (ref 36.0–46.0)
Hemoglobin: 11.9 g/dL — ABNORMAL LOW (ref 12.0–15.0)
MCH: 25.9 pg — ABNORMAL LOW (ref 26.0–34.0)
MCHC: 33 g/dL (ref 30.0–36.0)
MCV: 78.6 fL — ABNORMAL LOW (ref 80.0–100.0)
Platelets: 221 10*3/uL (ref 150–400)
RBC: 4.59 MIL/uL (ref 3.87–5.11)
RDW: 16.6 % — ABNORMAL HIGH (ref 11.5–15.5)
WBC: 7.5 10*3/uL (ref 4.0–10.5)
nRBC: 0 % (ref 0.0–0.2)

## 2021-12-18 LAB — URINALYSIS, ROUTINE W REFLEX MICROSCOPIC
Bilirubin Urine: NEGATIVE
Glucose, UA: >=500 mg/dL — AB
Hgb urine dipstick: NEGATIVE
Ketones, ur: NEGATIVE mg/dL
Leukocytes,Ua: NEGATIVE
Nitrite: NEGATIVE
Protein, ur: NEGATIVE mg/dL
Specific Gravity, Urine: 1.009 (ref 1.005–1.030)
pH: 8 (ref 5.0–8.0)

## 2021-12-18 LAB — MAGNESIUM: Magnesium: 2.1 mg/dL (ref 1.7–2.4)

## 2021-12-18 MED ORDER — PROCHLORPERAZINE EDISYLATE 10 MG/2ML IJ SOLN: 5.0000 mg | INTRAMUSCULAR | Status: DC | PRN

## 2021-12-18 MED ORDER — LACTATED RINGERS IV BOLUS: 1000.0000 mL | Freq: Three times a day (TID) | INTRAVENOUS | Status: DC | PRN

## 2021-12-18 MED ORDER — ACETAMINOPHEN 650 MG RE SUPP: 650.0000 mg | Freq: Four times a day (QID) | RECTAL | Status: DC | PRN

## 2021-12-18 MED ORDER — HYDROMORPHONE HCL 1 MG/ML IJ SOLN
0.5000 mg | INTRAMUSCULAR | Status: DC | PRN
  Administered 2021-12-18 – 2021-12-20 (×5): 1 mg via INTRAVENOUS
  Filled 2021-12-18 (×4): qty 1
  Filled 2021-12-18: qty 2

## 2021-12-18 MED ORDER — ACETAMINOPHEN 500 MG PO TABS
500.0000 mg | ORAL_TABLET | Freq: Three times a day (TID) | ORAL | Status: DC
  Administered 2021-12-19 (×3): 500 mg via ORAL
  Filled 2021-12-18 (×5): qty 1

## 2021-12-18 MED ORDER — SIMETHICONE 40 MG/0.6ML PO SUSP: 80.0000 mg | Freq: Four times a day (QID) | ORAL | Status: DC | PRN

## 2021-12-18 MED ORDER — MORPHINE SULFATE (PF) 4 MG/ML IV SOLN
4.0000 mg | Freq: Once | INTRAVENOUS | Status: AC
  Administered 2021-12-18: 4 mg via INTRAVENOUS
  Filled 2021-12-18: qty 1

## 2021-12-18 MED ORDER — PANTOPRAZOLE SODIUM 40 MG IV SOLR
40.0000 mg | INTRAVENOUS | Status: DC
  Administered 2021-12-19 – 2021-12-23 (×5): 40 mg via INTRAVENOUS
  Filled 2021-12-18 (×5): qty 10

## 2021-12-18 MED ORDER — CIPROFLOXACIN IN D5W 400 MG/200ML IV SOLN
400.0000 mg | Freq: Two times a day (BID) | INTRAVENOUS | Status: DC
  Administered 2021-12-18 – 2021-12-19 (×2): 400 mg via INTRAVENOUS
  Filled 2021-12-18 (×2): qty 200

## 2021-12-18 MED ORDER — ALUM & MAG HYDROXIDE-SIMETH 200-200-20 MG/5ML PO SUSP
30.0000 mL | Freq: Four times a day (QID) | ORAL | Status: DC | PRN
  Filled 2021-12-18: qty 30

## 2021-12-18 MED ORDER — LACTATED RINGERS IV SOLN: INTRAVENOUS | Status: AC

## 2021-12-18 MED ORDER — PHENOL 1.4 % MT LIQD: 2.0000 | OROMUCOSAL | Status: DC | PRN

## 2021-12-18 MED ORDER — ONDANSETRON HCL 4 MG/2ML IJ SOLN
4.0000 mg | Freq: Four times a day (QID) | INTRAMUSCULAR | Status: DC | PRN
  Administered 2021-12-19: 4 mg via INTRAVENOUS
  Filled 2021-12-18: qty 2

## 2021-12-18 MED ORDER — ACETAMINOPHEN 325 MG PO TABS
650.0000 mg | ORAL_TABLET | Freq: Four times a day (QID) | ORAL | Status: DC | PRN
  Administered 2021-12-20: 650 mg via ORAL
  Filled 2021-12-18: qty 2

## 2021-12-18 MED ORDER — BISACODYL 10 MG RE SUPP: 10.0000 mg | Freq: Two times a day (BID) | RECTAL | Status: DC | PRN

## 2021-12-18 MED ORDER — METOPROLOL TARTRATE 5 MG/5ML IV SOLN: 5.0000 mg | Freq: Four times a day (QID) | INTRAVENOUS | Status: DC | PRN

## 2021-12-18 MED ORDER — INSULIN ASPART 100 UNIT/ML IJ SOLN
0.0000 [IU] | Freq: Four times a day (QID) | INTRAMUSCULAR | Status: DC
  Administered 2021-12-21: 1 [IU] via SUBCUTANEOUS
  Administered 2021-12-21: 3 [IU] via SUBCUTANEOUS
  Administered 2021-12-21: 1 [IU] via SUBCUTANEOUS
  Administered 2021-12-21: 2 [IU] via SUBCUTANEOUS
  Administered 2021-12-22 (×2): 1 [IU] via SUBCUTANEOUS
  Filled 2021-12-18: qty 0.06

## 2021-12-18 MED ORDER — MONTELUKAST SODIUM 10 MG PO TABS
10.0000 mg | ORAL_TABLET | Freq: Every day | ORAL | Status: DC
  Administered 2021-12-19 – 2021-12-22 (×5): 10 mg via ORAL
  Filled 2021-12-18 (×5): qty 1

## 2021-12-18 MED ORDER — METHIMAZOLE 5 MG PO TABS
5.0000 mg | ORAL_TABLET | Freq: Every day | ORAL | Status: DC
  Administered 2021-12-19 – 2021-12-23 (×5): 5 mg via ORAL
  Filled 2021-12-18 (×5): qty 1

## 2021-12-18 MED ORDER — MAGIC MOUTHWASH: 15.0000 mL | Freq: Four times a day (QID) | ORAL | Status: DC | PRN

## 2021-12-18 MED ORDER — MOMETASONE FURO-FORMOTEROL FUM 200-5 MCG/ACT IN AERO
2.0000 | INHALATION_SPRAY | Freq: Two times a day (BID) | RESPIRATORY_TRACT | Status: DC
  Administered 2021-12-19 – 2021-12-23 (×7): 2 via RESPIRATORY_TRACT
  Filled 2021-12-18: qty 8.8

## 2021-12-18 MED ORDER — ALBUTEROL SULFATE (2.5 MG/3ML) 0.083% IN NEBU: 2.5000 mg | INHALATION_SOLUTION | RESPIRATORY_TRACT | Status: DC | PRN

## 2021-12-18 MED ORDER — TIOTROPIUM BROMIDE MONOHYDRATE 1.25 MCG/ACT IN AERS
2.0000 | INHALATION_SPRAY | Freq: Every day | RESPIRATORY_TRACT | Status: DC
Start: 2021-12-19 — End: 2021-12-19

## 2021-12-18 MED ORDER — METHOCARBAMOL 1000 MG/10ML IJ SOLN: 1000.0000 mg | Freq: Four times a day (QID) | INTRAVENOUS | Status: DC | PRN

## 2021-12-18 MED ORDER — SODIUM CHLORIDE 0.9 % IV SOLN: 8.0000 mg | Freq: Four times a day (QID) | INTRAVENOUS | Status: DC | PRN

## 2021-12-18 MED ORDER — DIPHENHYDRAMINE HCL 50 MG/ML IJ SOLN
12.5000 mg | Freq: Four times a day (QID) | INTRAMUSCULAR | Status: DC | PRN
  Administered 2021-12-21: 12.5 mg via INTRAVENOUS
  Filled 2021-12-18: qty 1

## 2021-12-18 MED ORDER — LIP MEDEX EX OINT
1.0000 "application " | TOPICAL_OINTMENT | Freq: Two times a day (BID) | CUTANEOUS | Status: DC
  Administered 2021-12-19 – 2021-12-23 (×9): 1 via TOPICAL
  Filled 2021-12-18 (×2): qty 7

## 2021-12-18 MED ORDER — ENALAPRILAT 1.25 MG/ML IV SOLN: 0.6250 mg | Freq: Four times a day (QID) | INTRAVENOUS | Status: DC | PRN

## 2021-12-18 MED ORDER — IOHEXOL 300 MG/ML  SOLN
100.0000 mL | Freq: Once | INTRAMUSCULAR | Status: AC | PRN
  Administered 2021-12-18: 100 mL via INTRAVENOUS

## 2021-12-18 MED ORDER — METRONIDAZOLE 500 MG/100ML IV SOLN
500.0000 mg | Freq: Two times a day (BID) | INTRAVENOUS | Status: DC
  Administered 2021-12-18 – 2021-12-20 (×5): 500 mg via INTRAVENOUS
  Filled 2021-12-18 (×5): qty 100

## 2021-12-18 MED ORDER — MENTHOL 3 MG MT LOZG: 1.0000 | LOZENGE | OROMUCOSAL | Status: DC | PRN

## 2021-12-18 NOTE — Assessment & Plan Note (Signed)
 #)   Essential Hypertension: documented h/o such, with outpatient antihypertensive regimen including irbesartan.  SBP's in the ED today: Initially hypertensive, with initial systolic blood pressures in the 160s, subsequently improving into the 120s following improvement in interval pain control with multiple doses of IV analgesia, as further detailed above.  No evidence of hypotension..   Plan: Close monitoring of subsequent BP via routine VS. in the context of current n.p.o. status, will hold home irbesartan for now.

## 2021-12-18 NOTE — ED Provider Notes (Signed)
4:30 PM-checkout from Dr. Francia Greaves to evaluate patient after return of ultrasound.  Patient had CT imaging consistent with gallstones as a likely source of pain.  Ultrasound ordered to evaluate for possible cholecystitis.  Patient does not have fever or elevated white count.  She states her symptoms started today and were upper abdominal pain and vomiting.  No prior similar problems she was eating well up until today.  No prior similar episodes according to her.  Reevaluation 6:50 PM-patient alert and cooperative.  Skin warm to touch.  Repeat vitals indicate significant hypertension 170/134, with elevated heart rate 115.  Medical decision making-symptomatic gallstones with possible cholecystitis.  Patient is moderately ill with multiple chronic medical problems.  She has a history of paroxysmal atrial fibrillation and is apparently not anticoagulated at this time.  Other pertinent history includes asthma, Graves' disease, obesity, sleep apnea, pulmonary hypertension, depression, diabetes with neuropathy, and chronic pain.  7:35 PM-Case discussed with pharmacy relative to her clinical, laboratory, radiographic findings and risks.  She has penicillin allergy without severe reactions.  She has tolerated cephalosporins in the past.  At this time it appears that Cipro and Flagyl is the best choice for treatment for her cholecystitis.  Case has been discussed with general surgery, Dr. Johney Maine who will see the patient.  Will contact hospitalist to admit.  Anticipate admission by hospitalist with medical clearance by them for surgery tomorrow.  Dr. Johney Maine is contemplated percutaneous drainage versus laparoscopic cholecystectomy.   Daleen Bo, MD 12/18/21 [phone]

## 2021-12-18 NOTE — ED Provider Notes (Signed)
[name]   CSN: [id number] Arrival date & time: 12/18/21  1316     History {Add pertinent medical, surgical, social history, OB history to HPI:1} Chief Complaint  Patient presents with   Abdominal Pain   Emesis    Thrall is a 76 y.o. female.  76 year old female with prior medical history as detailed below presents with complaint of pain.  Symptoms began this morning.  [name] reports associated nausea and vomiting.  [name] denies fever.  [name] did not take anything at home for her symptoms.  [name] denies prior abdominal surgeries.  The history is provided by the patient and medical records.  Abdominal Pain Pain location:  Epigastric, RUQ and RLQ Pain quality: aching and cramping   Pain radiates to:  Does not radiate Pain severity:  Moderate Onset quality:  Gradual Duration:  10 hours Timing:  Rare Progression:  Worsening Chronicity:  New Associated symptoms: vomiting   Emesis Associated symptoms: abdominal pain       Home Medications Prior to Admission medications   Medication Sig Start Date End Date Taking? Authorizing Provider  Acetaminophen-Codeine 300-30 MG tablet Take 1 tablet by mouth every 6 (six) hours as needed for pain. Patient taking differently: Take by mouth every 6 (six) hours as needed for pain. 08/01/21   Bedsole, Amy E, MD  albuterol (VENTOLIN HFA) 108 (90 Base) MCG/ACT inhaler INHALE 2 PUFFS BY MOUTH EVERY 6 HOURS AS NEEDED FOR WHEEZING OR SHORTNESS OF BREATH 12/12/21   Kozlow, Donnamarie Poag, MD  atorvastatin (LIPITOR) 10 MG tablet TAKE 1 TABLET(10 MG) BY MOUTH DAILY 05/14/21   Bedsole, Amy E, MD  Blood Glucose Monitoring Suppl (ONETOUCH VERIO) w/Device KIT Use to check blood sugar up to 2 times a day 08/16/20   Diona Browner, Amy E, MD  cyclobenzaprine (FLEXERIL) 5 MG tablet Take 1-2 tablets (5-10 mg total) by mouth 3 (three) times daily as needed for muscle spasms. 08/10/21   Gareth Morgan, MD   dextromethorphan-guaiFENesin (MUCINEX DM) 30-600 MG 12hr tablet Take 1 tablet by mouth 2 (two) times daily.    [provider]  diclofenac Sodium (VOLTAREN) 1 % GEL Apply 2 g topically 4 (four) times daily. 03/21/21   Bedsole, Amy E, MD  EPINEPHrine 0.3 mg/0.3 mL IJ SOAJ injection USE AS DIRECTED FOR LIFE THREATENING ALLERGIC REACTIONS 12/12/21   Kozlow, Donnamarie Poag, MD  famotidine (PEPCID) 40 MG tablet Take 1 (ONE) tablet by mouth every evening. 12/12/21   Kozlow, Donnamarie Poag, MD  fluticasone (FLOVENT HFA) 220 MCG/ACT inhaler 2 puffs 2 times daily during Flare Up. 12/12/21   Kozlow, Donnamarie Poag, MD  fluticasone-salmeterol (ADVAIR HFA) 230-21 MCG/ACT inhaler INHALE 2 PUFFS BY MOUTH TWICE DAILY TO PREVENT COUGH OR WHEEZING. RINSE MOUTH AFTER USE 12/12/21   Kozlow, Donnamarie Poag, MD  furosemide (LASIX) 20 MG tablet Take 20 mg as needed for a weight gain of 3 pounds overnight or 5 pounds in one week,. 06/23/21   Croitoru, Mihai, MD  gabapentin (NEURONTIN) 600 MG tablet TAKE 1 TABLET BY MOUTH EVERY DAY AT BREAKFAST, 1 TABLET AT LUNCH AND 2 TABLETS AT BEDTIME 03/27/21   Hyatt, Max T, DPM  GLYXAMBI 10-5 MG TABS TAKE 1 TABLET BY MOUTH DAILY 09/10/21   Bedsole, Amy E, MD  Homeopathic Products [facility] RELIEF EX) Apply 1 spray topically daily as needed (muscle cramps).    [provider]  HYDROmorphone (DILAUDID) 2 MG tablet Take 2 mg by mouth every 4 (four) hours as  needed for severe pain.    [provider]  hydrOXYzine (ATARAX/VISTARIL) 10 MG tablet Take 1 tablet (10 mg total) by mouth daily as needed for itching. 04/18/21   Bedsole, Amy E, MD  ipratropium (ATROVENT) 0.06 % nasal spray 1-2 sprays in each nostril 1-2 times er day as needed to dry nose 12/12/21   Kozlow, Donnamarie Poag, MD  irbesartan (AVAPRO) 150 MG tablet Take 1 tablet (150 mg total) by mouth daily. 09/18/21   Croitoru, Mihai, MD  Lactase (DAIRY-RELIEF PO) Take 1 capsule by mouth daily as needed (eating dairy).     [provider]  Lancets  (ONETOUCH DELICA PLUS [id number]) [place] USE TO CHECK BLOOD SUGAR UP TO TWICE DAILY 10/20/20   Bedsole, Amy E, MD  levalbuterol (XOPENEX) 1.25 MG/3ML nebulizer solution USE 1 VIAL VIA NEBULIZER EVERY 6 HOURS AS NEEDED FOR SHORTNESS OF BREATH OR WHEEZING 12/12/21   Kozlow, Donnamarie Poag, MD  liver oil-zinc oxide (DESITIN) 40 % ointment Apply 1 application topically as needed for irritation.    [provider]  Menthol-Methyl Salicylate (SALONPAS JET SPRAY EX) Apply 1 spray topically daily as needed (knee pain).    [provider]  methimazole (TAPAZOLE) 5 MG tablet Take 1 tablet (5 mg total) by mouth daily. 06/01/21   Philemon Kingdom, MD  montelukast (SINGULAIR) 10 MG tablet Take 1 tablet (10 mg total) by mouth at bedtime. For asthma control. 12/12/21   Kozlow, Donnamarie Poag, MD  Nutritional Supplements (NUTRITIONAL SUPPLEMENT PO) Take by mouth. Omega XL - Take 2 gel capsules 2 times a day    [provider]  nystatin (MYCOSTATIN) 100000 UNIT/ML suspension 70m swish and swallow after Advair 2 times daily. 12/12/21   Kozlow, EDonnamarie Poag MD  ondansetron (ZOFRAN-ODT) 4 MG disintegrating tablet Take 1 tablet (4 mg total) by mouth every 8 (eight) hours as needed for nausea or vomiting. 04/18/21   Bedsole, Amy E, MD  ondansetron (ZOFRAN-ODT) 8 MG disintegrating tablet Take 1 tablet (8 mg total) by mouth every 8 (eight) hours as needed for nausea or vomiting. 10/16/21   ALacretia Leigh MD  ONETOUCH VERIO test strip USE TO CHECK BLOOD SUGAR UP TO TWICE DAILY 10/20/20   Bedsole, Amy E, MD  oxybutynin (DITROPAN-XL) 10 MG 24 hr tablet Take 10 mg by mouth daily. 12/10/21   [provider]  oxyCODONE-acetaminophen (PERCOCET/ROXICET) 5-325 MG tablet Take 1-2 tablets by mouth every 6 (six) hours as needed for severe pain. 10/16/21   ALacretia Leigh MD  pantoprazole (PROTONIX) 40 MG tablet Take 1 (ONE) tablet by mouth every morning. 12/12/21   Kozlow, EDonnamarie Poag MD  PATADAY 0.2 % SOLN Place 1 drop into both eyes daily  as needed (allergies).  01/27/17   [provider]  Polyethyl Glycol-Propyl Glycol (SYSTANE OP) Place 1 drop into both eyes in the morning and at bedtime.    [provider]  potassium chloride SA (KLOR-CON) 20 MEQ tablet Take 1 tablet (20 mEq total) by mouth as needed (Take one daily when you need to take a Furosemide). 12/22/20   Croitoru, Mihai, MD  Tiotropium Bromide Monohydrate (SPIRIVA RESPIMAT) 1.25 MCG/ACT AERS Inhale 2 puffs into the lungs daily. 12/12/21   Kozlow, EDonnamarie Poag MD  TRULANCE 3 MG TABS Take 3 mg by mouth every other day. 12/06/18   [provider]  XARELTO 20 MG TABS tablet TAKE 1 TABLET(20 MG) BY MOUTH DAILY WITH SUPPER 09/01/21   Croitoru, MDani Gobble MD      Allergies  Latex, Oxycodone, Penicillins, Acetaminophen-codeine, Oxycodone-acetaminophen, Lyrica [pregabalin], Nickel, Phenytoin sodium extended, and Vicodin [hydrocodone-acetaminophen]    Review of Systems   Review of Systems  Gastrointestinal:  Positive for abdominal pain and vomiting.  All other systems reviewed and are negative.  Physical Exam Updated Vital Signs BP (!) 165/89   Pulse 91   Temp 97.9 F (36.6 C) (Oral)   Resp 17   Ht '5\' 4"'  (1.626 m)   Wt 83.5 kg   SpO2 100%   BMI 31.58 kg/m  Physical Exam Vitals and nursing Price reviewed.  Constitutional:      General: [name] is not in acute distress.    Appearance: Normal appearance. [name] is well-developed.  HENT:     Head: Normocephalic and atraumatic.  Eyes:     Conjunctiva/sclera: Conjunctivae normal.     Pupils: Pupils are equal, round, and reactive to light.  Cardiovascular:     Rate and Rhythm: Normal rate and regular rhythm.     Heart sounds: Normal heart sounds.  Pulmonary:     Effort: Pulmonary effort is normal. No respiratory distress.     Breath sounds: Normal breath sounds.  Abdominal:     General: There is no distension.     Palpations: Abdomen is soft.     Tenderness: There is abdominal tenderness in the right  upper quadrant, epigastric area and left upper quadrant.  Musculoskeletal:        General: No deformity. Normal range of motion.     Cervical back: Normal range of motion and neck supple.  Skin:    General: Skin is warm and dry.  Neurological:     General: No focal deficit present.     Mental Status: [name] is alert and oriented to person, place, and time.    ED Results / Procedures / Treatments   Labs (all labs ordered are listed, but only abnormal results are displayed) Labs Reviewed  COMPREHENSIVE METABOLIC PANEL - Abnormal; Notable for the following components:      Result Value   Chloride 112 (*)    Glucose, Bld 121 (*)    Calcium 8.1 (*)    Total Protein 6.3 (*)    Albumin 3.4 (*)    All other components within normal limits  CBC - Abnormal; Notable for the following components:   Hemoglobin 11.9 (*)    MCV 78.6 (*)    MCH 25.9 (*)    RDW 16.6 (*)    All other components within normal limits  URINALYSIS, ROUTINE W REFLEX MICROSCOPIC - Abnormal; Notable for the following components:   Color, Urine STRAW (*)    APPearance HAZY (*)    Glucose, UA >=500 (*)    Bacteria, UA RARE (*)    All other components within normal limits  LIPASE, BLOOD    EKG None  Radiology CT ABDOMEN PELVIS W CONTRAST  Result Date: 12/18/2021 CLINICAL DATA:  Abdominal pain, nausea, vomiting EXAM: CT ABDOMEN AND PELVIS WITH CONTRAST TECHNIQUE: Multidetector CT imaging of the abdomen and pelvis was performed using the standard protocol following bolus administration of intravenous contrast. RADIATION DOSE REDUCTION: This exam was performed according to the departmental dose-optimization program which includes automated exposure control, adjustment of the mA and/or kV according to patient size and/or use of iterative reconstruction technique. CONTRAST:  135m OMNIPAQUE IOHEXOL 300 MG/ML  SOLN COMPARISON:  11/26/2016 FINDINGS: Lower chest: Heart is enlarged in size. No focal infiltrates are seen in the  visualized lower lung fields. Hepatobiliary: No focal abnormality  is seen in the liver. There are multiple gallbladder stones. Gallbladder is distended. There is no significant wall thickening in gallbladder. Pancreas: No focal abnormality is seen. Spleen: Unremarkable. Adrenals/Urinary Tract: Adrenals are unremarkable. There is no hydronephrosis. There is 2.5 cm cyst in the lower pole of right kidney. There are no renal or ureteral stones. Urinary bladder is distended. Length of the bladder is proximally 17.6 cm with dome of the bladder at the level of umbilicus. There is no wall thickening in the bladder. Stomach/Bowel: Stomach is unremarkable. Small bowel loops not dilated. Appendix is not distinctly seen. There is no pericecal inflammation. There is no significant wall thickening in the colon. Vascular/Lymphatic: Unremarkable. Reproductive: Uterus is not seen. There are multiple dense calcifications in the right side of pelvis. There is no definite associated soft tissue mass. This finding has not changed significantly. These may suggest calcified lymph nodes or calcifications in the right adnexa. Other: There is no ascites or pneumoperitoneum. Small umbilical hernia containing fat is seen. Musculoskeletal: There is marked sclerosis in the bodies of L4, L5 and S1 vertebrae. Irregularity is seen in the endplates of L4, L5 and S1 vertebrae. There is no definite demonstrable fluid collection in the paraspinal region. Schmorl's node is seen in the lower endplate of body of L1 vertebra. Degenerative changes are noted in the visualized lower thoracic spine. IMPRESSION: There is no evidence intestinal obstruction or pneumoperitoneum. There is no hydronephrosis. Multiple gallbladder stones. Gallbladder is distended. If there are focal symptoms in the right upper quadrant follow-up gallbladder sonogram may be considered. There is interval appearance of marked sclerosis in the L4, L5 and S1 vertebrae along with  irregularities in the endplates adjacent to the disc spaces. Possibility of chronic discitis is not excluded. Please correlate with clinical symptoms and consider follow-up MRI. There is marked distention of the urinary bladder with upper margin of dome of the bladder at the level of umbilicus. Other findings as described in the body of the report. Electronically Signed   By: Elmer Picker M.D.   On: 12/18/2021 16:10    Procedures Procedures  {Document cardiac monitor, telemetry assessment procedure when appropriate:1}  Medications Ordered in ED Medications  morphine (PF) 4 MG/ML injection 4 mg (4 mg Intravenous Given 12/18/21 1350)  morphine (PF) 4 MG/ML injection 4 mg (4 mg Intravenous Given 12/18/21 1503)  iohexol (OMNIPAQUE) 300 MG/ML solution 100 mL (100 mLs Intravenous Contrast Given 12/18/21 1532)    ED Course/ Medical Decision Making/ A&P                           Medical Decision Making Amount and/or Complexity of Data Reviewed Labs: ordered. Radiology: ordered.  Risk Prescription drug management.    Medical Screen Complete  This patient presented to the ED with complaint of abdominal pain.  This complaint involves an extensive number of treatment options. The initial differential diagnosis includes, but is not limited to, abdominal pathology, metabolic abnormality,  This presentation is: Acute, Self-Limited, Previously Undiagnosed, Uncertain Prognosis, Complicated, Systemic Symptoms, and Threat to Life/Bodily Function  Patient presents with acute abdominal pain that began this morning.  Associated symptoms include nausea and vomiting.  Patient reports that her last bowel movement was yesterday.  [name] denies any urinary symptoms  Exam is significant for diffuse upper abdominal discomfort extending from the left to the right upper quadrants.  Patient without prior reported abdominal surgery.  Screening labs including CBC, CMP, lipase are without significant  abnormality.  Notably AST, ALT, alk phos are normal.  CT imaging reveals evidence of distended gallbladder with multiple stones.  Reexamination after administration of pain medication and reveals that patient is more comfortable.  However, [name] has significant right upper quadrant pain with palpation of this area.  Ultrasound pending.  Oncoming EDP aware of case.  Additional history obtained:  External records from outside sources obtained and reviewed including prior ED visits and prior Inpatient records.    Lab Tests:  I ordered and personally interpreted labs.  The pertinent results include: CBC, CMP, lipase   Imaging Studies ordered:  I ordered imaging studies including CT abdomen pelvis, right upper quadrant ultrasound I independently visualized and interpreted obtained imaging which showed *** I agree with the radiologist interpretation.   Cardiac Monitoring:  The patient was maintained on a cardiac monitor.  I personally viewed and interpreted the cardiac monitor which showed an underlying rhythm of: NSR   Medicines ordered:  I ordered medication including morphine for abdominal pain Reevaluation of the patient after these medicines showed that the patient: improved    Problem List / ED Course:  Abdominal pain, gallstones   Reevaluation:  After the interventions noted above, I reevaluated the patient and found that they have: improved  Disposition:  After consideration of the diagnostic results and the patients response to treatment, I feel that the patent would benefit from ***.    {Document critical care time when appropriate:1} {Document review of labs and clinical decision tools ie heart score, Chads2Vasc2 etc:1}  {Document your independent review of radiology images, and any outside records:1} {Document your discussion with family members, caretakers, and with consultants:1} {Document social determinants of health affecting pt's care:1} {Document  your decision making why or why not admission, treatments were needed:1} Final Clinical Impression(s) / ED Diagnoses Final diagnoses:  None    Rx / DC Orders ED Discharge Orders     None

## 2021-12-18 NOTE — Assessment & Plan Note (Signed)
  #)   Hyperthyroidism: Documented history of such, on methimazole as an outpatient.  Plan: Continue outpatient methimazole

## 2021-12-18 NOTE — Assessment & Plan Note (Signed)
 #)   Moderate persistent asthma: Documented history of such, without clinical evidence to suggest acute exacerbation at this time.  Outpatient respiratory regimen includes scheduled Advair, scheduled Spiriva, as needed albuterol inhaler, in addition to the Singulair.  Plan: Continue outpatient scheduled respiratory regimen.  As needed albuterol nebulizer.  Add on serum magnesium level.

## 2021-12-18 NOTE — Telephone Encounter (Signed)
Per chart review tab patient is presently at [facility] ED. Sending note to Dr Diona Browner who is out of office and [name] CMA.

## 2021-12-18 NOTE — Telephone Encounter (Signed)
[name] - Client TELEPHONE ADVICE RECORD [facility] Patient Name: [name] Gender: Female DOB: [birth year] Age: 76 Y 76 M 23 D Return Phone Number: [phone] (Primary), [phone] (Secondary) Address: [address] [name]  [address] Client [name] - Client Client Site [place] [facility] Provider [name] - MD Contact Type Call Who Is Calling Patient / Member / Family / Caregiver Call Type Triage / Clinical Relationship To Patient Self Return Phone Number [phone] (Primary) Chief Complaint SEVERE ABDOMINAL PAIN - Severe pain in abdomen Reason for Call Symptomatic / Request for Health Information Initial Comment Caller states she is having severe stomach pain. [place] [facility] No Nurse Assessment Nurse: Ottis Stain, RN, [place] Date/Time (Eastern Time): 12/18/2021 11:55:54 AM Confirm and document reason for call. If symptomatic, describe symptoms. ---Caller states having severe constant stomach pain right under breast area. Pain 10/10. +Vomiting x 3. Took ginger ale, MOM, soda and water, alka seltzer. Nothing is helping and pain is getting worse. No diarrhea. No fever. Does the patient have any new or worsening symptoms? ---Yes Will a triage be completed? ---Yes Related visit to physician within the last 2 weeks? ---No Does the PT have any chronic conditions? (i.e. diabetes, asthma, this includes High risk factors for pregnancy, etc.) ---Yes List chronic conditions. ---type 2 Diabetic, HTN, heart issues. Is this a behavioral health or substance abuse call? ---No Guidelines Guideline Title Affirmed Question Affirmed Notes Nurse Date/Time Eilene Ghazi Time) Abdominal Pain - Upper [1] SEVERE pain (e.g., excruciating) AND [2] present > 1 hour Ottis Stain, RN, Sherrie 12/18/2021 12:00:06 PM Disp. Time  Eilene Ghazi Time) Disposition Final User 12/18/2021 11:54:42 AM Send to Urgent Sima Matas, Danielle PLEASE NOTE: All timestamps contained within this report are represented as [place] Standard Time. CONFIDENTIALTY NOTICE: This fax transmission is intended only for the addressee. It contains information that is legally privileged, confidential or otherwise protected from use or disclosure. If you are not the intended recipient, you are strictly prohibited from reviewing, disclosing, copying using or disseminating any of this information or taking any action in reliance on or regarding this information. If you have received this fax in error, please notify us immediately by telephone so that we can arrange for its return to [place]. Phone: [phone], Toll-Free: [phone], Fax: [phone] Page: 2 of 2 Call Id: [id number] 12/18/2021 12:02:21 PM Go to ED Now Yes Ottis Stain, RN, Sherrie Caller Disagree/Comply Comply Caller Understands Yes PreDisposition InappropriateToAsk Care Advice Given Per Guideline GO TO ED NOW: * You need to be seen in the Emergency Department. * Go to the ED at ___________ [place]: * Do not eat or drink anything for now. * Another adult should drive. Referrals GO TO FACILITY OTHER - [facility]

## 2021-12-18 NOTE — Assessment & Plan Note (Signed)
  #)   GERD: documented h/o such; on Protonix as well as Pepcid as outpatient.   Plan: In the setting of current n.p.o. status, will transiently convert oral Protonix to IV route of administration, daily frequency.  Hold home Pepcid for now.

## 2021-12-18 NOTE — Consult Note (Signed)
12/18/2021      [name]  08-10-1945 [id number]  CARE TEAM:  PCP: [name], MD  Outpatient Care Team: Patient Care Team: [name], MD as PCP - General (Family Medicine) Croitoru, Dani Gobble, MD as PCP - Cardiology (Cardiology) Hortencia Pilar, MD as Consulting Physician (Ophthalmology) Neldon Mc, Donnamarie Poag, MD as Consulting Physician (Allergy and Immunology) Anda Kraft, MD as Consulting Physician (Endocrinology) Inocencio Homes, DPM as Consulting Physician (Podiatry) Croitoru, Dani Gobble, MD as Consulting Physician (Cardiology) Druscilla Brownie, MD as Consulting Physician (Dermatology) Boyd Kerbs, MD as Referring Physician (Specialist) [place], Selinda Eon, [place] as Referring Physician (Chiropractic Medicine) Ronald Lobo, MD as Consulting Physician (Gastroenterology) Debbora Dus, [facility] as Pharmacist (Pharmacist) Hallows, Verlee Monte, MD (Orthopedic Surgery) Dohmeier, Asencion Partridge, MD as Consulting Physician (Neurology)  Inpatient Treatment Team: Treatment Team: Attending Provider: Daleen Bo, MD; Registered Nurse: Lewanda Rife, RN; Technician: [facility] [place], [place]; Consulting Physician: Edison Pace, Md, MD; Technician: Aguirre-Carachure, Allie Dimmer, NT   This patient is a 76 y.o.female who presents today for surgical evaluation at the request of Daleen Bo, MD.  [name] emergency department.   Chief complaint / Reason for evaluation: Abdominal pain and probable acute cholecystitis  76 year old woman with numerous issues.  Pulmonary mutation, asthma, diabetes, sleep apnea.  History of atrial fibrillation flutter.  History of pulmonary embolus and DVTs in the past.  Takes Xarelto anticoagulation -last dose 6/4 last night.  Chronic pain, Graves' disease, obesity, anxiety/depression.  Seen by numerous physicians as noted above.  Followed by [facility] gastroenterology.  Came in with tach of upper abdominal pain epigastric and right-sided.  Nausea and vomiting.   Persistent and gradually worsening.  Came to emergency department.  History of physical exam and ultrasound concerning for cholecystitis.  Patient hypertensive and tachycardic.  Surgical consultation requested.  Patient notes the pain started few days ago.  Mainly right-sided.  Nauseated.  Still with discomfort but feeling a little better with pain and nausea medicines.  Had a hysterectomy but no other major abdominal surgeries.   Assessment  [name]  76 y.o. female       Problem List:  Principal Problem:   Acute calculous cholecystitis Active Problems:   Pulmonary hypertension (HCC)   Asthma, moderate persistent   History of pulmonary embolism   Diabetes mellitus with neuropathy (HCC)   GERD (gastroesophageal reflux disease)   Mild obstructive sleep apnea   Obstructive sleep apnea treated with continuous positive airway pressure (CPAP)   Probable acute cholecystitis with gallbladder wall thickening  Plan:  Medical admission and stabilization and clearance.  Do not know if she needs cardiac clearance or not with history of pulmonary hypertension and atrial fibs/flutter..  Defer to hospitalist service.  IV antibiotics.  Normally would do piperacillin/tazobactam with pen allergy we will do Cipro/Flagyl.  Nausea and pain control.  Hold Xerelto anticoagulation for now.  Ideally off that for 48 hours before considering surgery or other intervention.  Most likely would benefit from cholecystectomy this admission if felt to be cleared medically.  If more grossly declines or is considered high risk, may benefit from percutaneous cholecystectomy by interventional radiology.  Surgery will help follow to determine course and make planning.  Dr. Harlow Asa to assume care in the morning and help follow the patient through the week.  Diabetic control.  Seems to have glycosuria but not any major hyperglycemia at this time.  CPAP for sleep apnea.  Inhalers and management for pulmonary  issues.  -VTE prophylaxis-  SCDs, etc -mobilize as tolerated to help recovery  I reviewed nursing notes, ED provider notes, last 24 h vitals and pain scores, last 24 h labs and trends, and last 24 h imaging results. I have reviewed this patient's available data, including medical history, events of note, test results, etc as part of my evaluation.  A significant portion of that time was spent in counseling.  Care during the described time interval was provided by me.  This care required moderate level of medical decision making.  12/18/2021  [name], MD, FACS, MASCRS Esophageal, Gastrointestinal & Colorectal Surgery Robotic and Minimally Invasive Surgery  [facility], [place]. [address] [place], [place] [address] [phone] Fax [phone] Main  CONTACT INFORMATION:  Weekday (9AM-5PM): Call [facility] main office at [phone]  Weeknight (5PM-9AM) or Weekend/Holiday: Check [email or web address] (password " [email or web address]") for General Surgery [facility] coverage  (Please, do not use SecureChat as it is not reliable communication to operating surgeons for immediate patient care)      12/18/2021      Past Medical History:  Diagnosis Date   Allergic rhinitis    Allergy    Arthritis    Asthma    Chronic headache    Diabetes mellitus    DVT (deep venous thrombosis) (HCC)    Dyspnea    Heart murmur    Hypertension    Penicillin allergy 01/19/2020   Pulmonary embolism ([place])    Pulmonary hypertension ([place])    Seizures ([facility])     Past Surgical History:  Procedure Laterality Date   ABDOMINAL HYSTERECTOMY     partial, has ovaries   CARDIAC CATHETERIZATION  12/28/2010   Mod. pulmonary hypertension, normal coronary arteries   CARDIOVERSION N/A 11/23/2020   Procedure: CARDIOVERSION;  Surgeon: Sanda Klein, MD;  Location: [facility];  Service: Cardiovascular;  Laterality: N/A;   DOPPLER ECHOCARDIOGRAPHY  10/08/2011    EF=>55%,mild asymmetric LVH, mod. TR, mod. PH, mild to mod LA dilatation   IR LUMBAR DISC ASPIRATION W/IMG GUIDE  01/12/2020   KNEE ARTHROSCOPY Left    KNEE SURGERY     Nuclear Stress Test  05/20/2006   No ischemia   PARTIAL HYSTERECTOMY     PLANTAR FASCIA SURGERY     TONSILLECTOMY      Social History   Socioeconomic History   Marital status: Widowed    Spouse name: Not on file   Number of children: Y   Years of education: Not on file   Highest education level: Not on file  Occupational History   Occupation: retired Geologist, engineering.   Tobacco Use   Smoking status: Never   Smokeless tobacco: Never  Vaping Use   Vaping Use: Never used  Substance and Sexual Activity   Alcohol use: Not Currently    Alcohol/week: 0.0 standard drinks    Comment: occ glass on wine   Drug use: No   Sexual activity: Never  Other Topics Concern   Not on file  Social History Narrative   Widow    limited exercise.   Social Determinants of Health   Financial Resource Strain: Low Risk    Difficulty of Paying Living Expenses: Not very hard  Food Insecurity: Not on file  Transportation Needs: Not on file  Physical Activity: Not on file  Stress: Not on file  Social Connections: Not on file  Intimate Partner Violence: Not on file    Family History  Problem Relation Age  of Onset   Hypertension Mother    Clotting disorder Mother    Breast cancer Mother    Arthritis Mother    Stroke Mother    Diabetes Mother    Cancer Brother    Alcohol abuse Father    Arthritis Sister    Diabetes Sister    Multiple sclerosis Sister    Allergies Other        grandson   Allergic rhinitis Neg Hx    Angioedema Neg Hx    Asthma Neg Hx    Atopy Neg Hx    Eczema Neg Hx    Immunodeficiency Neg Hx    Urticaria Neg Hx     Current Facility-Administered Medications  Medication Dose Route Frequency Provider Last Rate Last Admin   acetaminophen (TYLENOL) suppository 650 mg  650 mg Rectal Q6H PRN Michael Boston,  MD       acetaminophen (TYLENOL) tablet 500 mg  500 mg Oral TID WC & HS Michael Boston, MD       alum & mag hydroxide-simeth (MAALOX/MYLANTA) 200-200-20 MG/5ML suspension 30 mL  30 mL Oral Q6H PRN Michael Boston, MD       bisacodyl (DULCOLAX) suppository 10 mg  10 mg Rectal Q12H PRN Michael Boston, MD       ciprofloxacin (CIPRO) IVPB 400 mg  400 mg Intravenous Gorden Harms, MD       diphenhydrAMINE (BENADRYL) injection 12.5-25 mg  12.5-25 mg Intravenous Q6H PRN Michael Boston, MD       enalaprilat (VASOTEC) injection 0.625-1.25 mg  0.625-1.25 mg Intravenous Q6H PRN Michael Boston, MD       HYDROmorphone (DILAUDID) injection 0.5-2 mg  0.5-2 mg Intravenous Q2H PRN Cybele Maule, Remo Lipps, MD       lactated ringers bolus 1,000 mL  1,000 mL Intravenous Q8H PRN Presly Steinruck, Remo Lipps, MD       lip balm (CARMEX) ointment 1 application.  1 application. Topical BID Michael Boston, MD       magic mouthwash  15 mL Oral QID PRN Michael Boston, MD       menthol-cetylpyridinium (CEPACOL) lozenge 3 mg  1 lozenge Oral PRN Michael Boston, MD       methocarbamol (ROBAXIN) 1,000 mg in dextrose 5 % 100 mL IVPB  1,000 mg Intravenous Q6H PRN Michael Boston, MD       metoprolol tartrate (LOPRESSOR) injection 5 mg  5 mg Intravenous Q6H PRN Michael Boston, MD       metroNIDAZOLE (FLAGYL) IVPB 500 mg  500 mg Intravenous Gorden Harms, MD       omalizumab Arvid Right) injection 300 mg  300 mg Subcutaneous Q28 days [name], MD   300 mg at 12/12/21 1042   ondansetron (ZOFRAN) injection 4 mg  4 mg Intravenous Q6H PRN Michael Boston, MD       Or   ondansetron (ZOFRAN) 8 mg in sodium chloride 0.9 % 50 mL IVPB  8 mg Intravenous Q6H PRN Michael Boston, MD       phenol (CHLORASEPTIC) mouth spray 2 spray  2 spray Mouth/Throat PRN Michael Boston, MD       prochlorperazine (COMPAZINE) injection 5-10 mg  5-10 mg Intravenous Q4H PRN Michael Boston, MD       simethicone (MYLICON) 40 KG/2.5KY suspension 80 mg  80 mg Oral QID PRN Michael Boston, MD        Current Outpatient Medications  Medication Sig Dispense Refill   Acetaminophen-Codeine 300-30 MG tablet Take 1 tablet by mouth every  6 (six) hours as needed for pain. (Patient taking differently: Take by mouth every 6 (six) hours as needed for pain.) 30 tablet 0   albuterol (VENTOLIN HFA) 108 (90 Base) MCG/ACT inhaler INHALE 2 PUFFS BY MOUTH EVERY 6 HOURS AS NEEDED FOR WHEEZING OR SHORTNESS OF BREATH 18 g 1   atorvastatin (LIPITOR) 10 MG tablet TAKE 1 TABLET(10 MG) BY MOUTH DAILY 90 tablet 3   Blood Glucose Monitoring Suppl (ONETOUCH VERIO) w/Device KIT Use to check blood sugar up to 2 times a day 1 kit 0   cyclobenzaprine (FLEXERIL) 5 MG tablet Take 1-2 tablets (5-10 mg total) by mouth 3 (three) times daily as needed for muscle spasms. 20 tablet 0   dextromethorphan-guaiFENesin (MUCINEX DM) 30-600 MG 12hr tablet Take 1 tablet by mouth 2 (two) times daily.     diclofenac Sodium (VOLTAREN) 1 % GEL Apply 2 g topically 4 (four) times daily. 400 g 1   EPINEPHrine 0.3 mg/0.3 mL IJ SOAJ injection USE AS DIRECTED FOR LIFE THREATENING ALLERGIC REACTIONS 2 each 2   famotidine (PEPCID) 40 MG tablet Take 1 (ONE) tablet by mouth every evening. 90 tablet 1   fluticasone (FLOVENT HFA) 220 MCG/ACT inhaler 2 puffs 2 times daily during Flare Up. 12 g 5   fluticasone-salmeterol (ADVAIR HFA) 230-21 MCG/ACT inhaler INHALE 2 PUFFS BY MOUTH TWICE DAILY TO PREVENT COUGH OR WHEEZING. RINSE MOUTH AFTER USE 12 g 5   furosemide (LASIX) 20 MG tablet Take 20 mg as needed for a weight gain of 3 pounds overnight or 5 pounds in one week,. 30 tablet 3   gabapentin (NEURONTIN) 600 MG tablet TAKE 1 TABLET BY MOUTH EVERY DAY AT BREAKFAST, 1 TABLET AT LUNCH AND 2 TABLETS AT BEDTIME 360 tablet 1   GLYXAMBI 10-5 MG TABS TAKE 1 TABLET BY MOUTH DAILY 30 tablet 5   Homeopathic Products (THERAWORX RELIEF EX) Apply 1 spray topically daily as needed (muscle cramps).     HYDROmorphone (DILAUDID) 2 MG tablet Take 2 mg by mouth every 4 (four)  hours as needed for severe pain.     hydrOXYzine (ATARAX/VISTARIL) 10 MG tablet Take 1 tablet (10 mg total) by mouth daily as needed for itching. 30 tablet 2   ipratropium (ATROVENT) 0.06 % nasal spray 1-2 sprays in each nostril 1-2 times er day as needed to dry nose 15 mL 5   irbesartan (AVAPRO) 150 MG tablet Take 1 tablet (150 mg total) by mouth daily. 90 tablet 2   Lactase (DAIRY-RELIEF PO) Take 1 capsule by mouth daily as needed (eating dairy).      Lancets (ONETOUCH DELICA PLUS [id number]) MISC USE TO CHECK BLOOD SUGAR UP TO TWICE DAILY 100 each 5   levalbuterol (XOPENEX) 1.25 MG/3ML nebulizer solution USE 1 VIAL VIA NEBULIZER EVERY 6 HOURS AS NEEDED FOR SHORTNESS OF BREATH OR WHEEZING 900 mL 1   liver oil-zinc oxide (DESITIN) 40 % ointment Apply 1 application topically as needed for irritation.     Menthol-Methyl Salicylate (SALONPAS JET SPRAY EX) Apply 1 spray topically daily as needed (knee pain).     methimazole (TAPAZOLE) 5 MG tablet Take 1 tablet (5 mg total) by mouth daily. 90 tablet 3   montelukast (SINGULAIR) 10 MG tablet Take 1 tablet (10 mg total) by mouth at bedtime. For asthma control. 90 tablet 1   Nutritional Supplements (NUTRITIONAL SUPPLEMENT PO) Take by mouth. Omega XL - Take 2 gel capsules 2 times a day     nystatin (MYCOSTATIN) 100000 UNIT/ML suspension  64m swish and swallow after Advair 2 times daily. 473 mL 5   ondansetron (ZOFRAN-ODT) 4 MG disintegrating tablet Take 1 tablet (4 mg total) by mouth every 8 (eight) hours as needed for nausea or vomiting. 20 tablet 0   ondansetron (ZOFRAN-ODT) 8 MG disintegrating tablet Take 1 tablet (8 mg total) by mouth every 8 (eight) hours as needed for nausea or vomiting. 20 tablet 0   ONETOUCH VERIO test strip USE TO CHECK BLOOD SUGAR UP TO TWICE DAILY 100 strip 5   oxybutynin (DITROPAN-XL) 10 MG 24 hr tablet Take 10 mg by mouth daily.     oxyCODONE-acetaminophen (PERCOCET/ROXICET) 5-325 MG tablet Take 1-2 tablets by mouth every 6 (six)  hours as needed for severe pain. 15 tablet 0   pantoprazole (PROTONIX) 40 MG tablet Take 1 (ONE) tablet by mouth every morning. 90 tablet 1   PATADAY 0.2 % SOLN Place 1 drop into both eyes daily as needed (allergies).   4   Polyethyl Glycol-Propyl Glycol (SYSTANE OP) Place 1 drop into both eyes in the morning and at bedtime.     potassium chloride SA (KLOR-CON) 20 MEQ tablet Take 1 tablet (20 mEq total) by mouth as needed (Take one daily when you need to take a Furosemide). 30 tablet 0   Tiotropium Bromide Monohydrate (SPIRIVA RESPIMAT) 1.25 MCG/ACT AERS Inhale 2 puffs into the lungs daily. 4 g 5   TRULANCE 3 MG TABS Take 3 mg by mouth every other day.     XARELTO 20 MG TABS tablet TAKE 1 TABLET(20 MG) BY MOUTH DAILY WITH SUPPER 90 tablet 1     Allergies  Allergen Reactions   Latex Hives   Oxycodone Nausea And Vomiting and Nausea Only    Other reaction(s): Abdominal Pain, Vomiting   Penicillins Swelling    Has patient had a PCN reaction causing immediate rash, facial/tongue/throat swelling, SOB or lightheadedness with hypotension patient had a PCN reaction causing severe rash involving mucus membranes or skin necrosis: [id number]}Has patient had a PCN reaction that required hospitalization/No Has patient had a PCN reaction occurring within the last 10 years: No If all of the above answers are "NO", then may proceed with Cephalosporin use.      Acetaminophen-Codeine Nausea Only    Other reaction(s): Vomiting   Hydrocodone Nausea And Vomiting   Lyrica [Pregabalin] Other (See Comments)    Blurred vision.   Nickel Rash   Phenytoin Sodium Extended Swelling    ROS:   All other systems reviewed & are negative except per HPI or as noted below: Constitutional:  No fevers, chills, sweats.  Weight stable Eyes:  No vision changes, No discharge HENT:  No sore throats, nasal drainage Lymph: No neck swelling, No bruising easily Pulmonary:  No cough, productive sputum CV: No orthopnea, PND   Patient walks 1/4 mile without difficulty.  No exertional chest/neck/shoulder/arm pain.  GI:  No personal nor family history of GI/colon cancer, inflammatory bowel disease, irritable bowel syndrome, allergy such as Celiac Sprue, dietary/dairy problems, colitis, ulcers nor gastritis.  No recent sick contacts/gastroenteritis.  No travel outside the country.  No changes in diet.  Renal: No UTIs, No hematuria Genital:  No drainage, bleeding, masses Musculoskeletal: No severe joint pain.  Good ROM major joints Skin:  No sores or lesions Heme/Lymph:  No easy bleeding.  No swollen lymph nodes   BP (!) 170/134   Pulse (!) 115   Temp 99 F (37.2 C) (Oral)   Resp (!) 30  Ht '5\' 4"'  (1.626 m)   Wt 83.5 kg   SpO2 100%   BMI 31.58 kg/m   Physical Exam:  Constitutional: Tired and mildly sickly but not toxic.  Not cachectic.  Hygeine adequate.  Vitals signs as above.   Eyes: Pupils reactive, normal extraocular movements. Sclera nonicteric Neuro: CN II-XII intact.  No major focal sensory defects.  No major motor deficits. Lymph: No head/neck/groin lymphadenopathy Psych:  No severe agitation.  No severe anxiety.  Judgment & insight Adequate, Oriented x4, HENT: Normocephalic, Mucus membranes moist.  No thrush.   Neck: Supple, No tracheal deviation.  No obvious thyromegaly Chest: No pain to chest wall compression.  Good respiratory excursion.  No audible wheezing CV:  Pulses intact.  irregular rhythm.  No major extremity edema  Abdomen:  Flat Hernia: Not present. Diastasis recti: Not present. Soft.   Mildly distended.  Tenderness on right side abdomen most focal in right upper quadrant.  Questionable Murphy sign..  No hepatomegaly.  No splenomegaly  Gen:  Inguinal hernia: Not present.  Inguinal lymph nodes: without lymphadenopathy.    Rectal: (Deferred)  Ext: No obvious deformity or contracture.  Edema: Not present.  No cyanosis Skin: No major subcutaneous nodules.  Warm and  dry Musculoskeletal: Severe joint rigidity not present.  No obvious clubbing.  No digital petechiae.     Results:   Labs: Results for orders placed or performed during the hospital encounter of 12/18/21 (from the past 48 hour(s))  Lipase, blood     Status: None   Collection Time: 12/18/21  2:25 PM  Result Value Ref Range   Lipase 23 11 - 51 U/L    Comment: Performed at [facility], [place] [address], [place], [place] [address]  Comprehensive metabolic panel     Status: Abnormal   Collection Time: 12/18/21  2:25 PM  Result Value Ref Range   Sodium 142 135 - 145 mmol/L   Potassium 3.8 3.5 - 5.1 mmol/L   Chloride 112 (H) 98 - 111 mmol/L   CO2 24 22 - 32 mmol/L   Glucose, Bld 121 (H) 70 - 99 mg/dL    Comment: Glucose reference range applies only to samples taken after fasting for at least 8 hours.   BUN 12 8 - 23 mg/dL   Creatinine, Ser 0.84 0.44 - 1.00 mg/dL   Calcium 8.1 (L) 8.9 - 10.3 mg/dL   Total Protein 6.3 (L) 6.5 - 8.1 g/dL   Albumin 3.4 (L) 3.5 - 5.0 g/dL   AST 22 15 - 41 U/L   ALT 9 0 - 44 U/L   Alkaline Phosphatase 74 38 - 126 U/L   Total Bilirubin 0.7 0.3 - 1.2 mg/dL   GFR, Estimated >60 >60 mL/min    Comment: (NOTE) Calculated using the CKD-EPI Creatinine Equation (2021)    Anion gap 6 5 - 15    Comment: Performed at [facility], [place] [address], [place], [place] [address]  CBC     Status: Abnormal   Collection Time: 12/18/21  2:25 PM  Result Value Ref Range   WBC 7.5 4.0 - 10.5 K/uL   RBC 4.59 3.87 - 5.11 MIL/uL   Hemoglobin 11.9 (L) 12.0 - 15.0 g/dL   HCT 36.1 36.0 - 46.0 %   MCV 78.6 (L) 80.0 - 100.0 fL   MCH 25.9 (L) 26.0 - 34.0 pg   MCHC 33.0 30.0 - 36.0 g/dL   RDW 16.6 (H) 11.5 - 15.5 %  Platelets 221 150 - 400 K/uL   nRBC 0.0 0.0 - 0.2 %    Comment: Performed at [facility], [place] [address], [place], [place] [address]  Urinalysis, Routine w reflex microscopic Urine, Clean Catch     Status:  Abnormal   Collection Time: 12/18/21  3:51 PM  Result Value Ref Range   Color, Urine STRAW (A) YELLOW   APPearance HAZY (A) CLEAR   Specific Gravity, Urine 1.009 1.005 - 1.030   pH 8.0 5.0 - 8.0   Glucose, UA >=500 (A) NEGATIVE mg/dL   Hgb urine dipstick NEGATIVE NEGATIVE   Bilirubin Urine NEGATIVE NEGATIVE   Ketones, ur NEGATIVE NEGATIVE mg/dL   Protein, ur NEGATIVE NEGATIVE mg/dL   Nitrite NEGATIVE NEGATIVE   Leukocytes,Ua NEGATIVE NEGATIVE   RBC / HPF 0-5 0 - 5 RBC/hpf   WBC, UA 0-5 0 - 5 WBC/hpf   Bacteria, UA RARE (A) NONE SEEN   Squamous Epithelial / LPF 0-5 0 - 5   Mucus PRESENT     Comment: Performed at [facility], [place] [address] [place], [place] [address]   *Note: Due to a large number of results and/or encounters for the requested time period, some results have not been displayed. A complete set of results can be found in Results Review.    Imaging / Studies: CT ABDOMEN PELVIS W CONTRAST  Result Date: 12/18/2021 CLINICAL DATA:  Abdominal pain, nausea, vomiting EXAM: CT ABDOMEN AND PELVIS WITH CONTRAST TECHNIQUE: Multidetector CT imaging of the abdomen and pelvis was performed using the standard protocol following bolus administration of intravenous contrast. RADIATION DOSE REDUCTION: This exam was performed according to the departmental dose-optimization program which includes automated exposure control, adjustment of the mA and/or kV according to patient size and/or use of iterative reconstruction technique. CONTRAST:  165m OMNIPAQUE IOHEXOL 300 MG/ML  SOLN COMPARISON:  11/26/2016 FINDINGS: Lower chest: Heart is enlarged in size. No focal infiltrates are seen in the visualized lower lung fields. Hepatobiliary: No focal abnormality is seen in the liver. There are multiple gallbladder stones. Gallbladder is distended. There is no significant wall thickening in gallbladder. Pancreas: No focal abnormality is seen. Spleen: Unremarkable. Adrenals/Urinary Tract:  Adrenals are unremarkable. There is no hydronephrosis. There is 2.5 cm cyst in the lower pole of right kidney. There are no renal or ureteral stones. Urinary bladder is distended. Length of the bladder is proximally 17.6 cm with dome of the bladder at the level of umbilicus. There is no wall thickening in the bladder. Stomach/Bowel: Stomach is unremarkable. Small bowel loops not dilated. Appendix is not distinctly seen. There is no pericecal inflammation. There is no significant wall thickening in the colon. Vascular/Lymphatic: Unremarkable. Reproductive: Uterus is not seen. There are multiple dense calcifications in the right side of pelvis. There is no definite associated soft tissue mass. This finding has not changed significantly. These may suggest calcified lymph nodes or calcifications in the right adnexa. Other: There is no ascites or pneumoperitoneum. Small umbilical hernia containing fat is seen. Musculoskeletal: There is marked sclerosis in the bodies of L4, L5 and S1 vertebrae. Irregularity is seen in the endplates of L4, L5 and S1 vertebrae. There is no definite demonstrable fluid collection in the paraspinal region. Schmorl's node is seen in the lower endplate of body of L1 vertebra. Degenerative changes are noted in the visualized lower thoracic spine. IMPRESSION: There is no evidence intestinal obstruction or pneumoperitoneum. There is no hydronephrosis. Multiple gallbladder stones. Gallbladder is distended. If  there are focal symptoms in the right upper quadrant follow-up gallbladder sonogram may be considered. There is interval appearance of marked sclerosis in the L4, L5 and S1 vertebrae along with irregularities in the endplates adjacent to the disc spaces. Possibility of chronic discitis is not excluded. Please correlate with clinical symptoms and consider follow-up MRI. There is marked distention of the urinary bladder with upper margin of dome of the bladder at the level of umbilicus. Other  findings as described in the body of the report. Electronically Signed   By: Elmer Picker M.D.   On: 12/18/2021 16:10   US Abdomen Limited RUQ (LIVER/GB)  Result Date: 12/18/2021 CLINICAL DATA:  Abdominal pain EXAM: ULTRASOUND ABDOMEN LIMITED RIGHT UPPER QUADRANT COMPARISON:  Same day CT FINDINGS: Gallbladder: Distended gallbladder containing multiple stones, largest measuring up to 2.2 cm. Small amount of layering biliary sludge within the gallbladder. Gallbladder wall thickening of up to 4.4 mm. A positive sonographic Percell Miller sign was indicated by the technologist. Common bile duct: Diameter: 3 mm. Liver: No focal lesion identified. Within normal limits in parenchymal echogenicity. Portal vein is patent on color Doppler imaging with normal direction of blood flow towards the liver. Other: None. IMPRESSION: Sonographic features suggestive of acute calculus cholecystitis. Electronically Signed   By: Davina Poke D.O.   On: 12/18/2021 17:07    Medications / Allergies: per chart  Antibiotics: Anti-infectives (From admission, onward)    Start     Dose/Rate Route Frequency Ordered Stop   12/18/21 2000  ciprofloxacin (CIPRO) IVPB 400 mg        400 mg 200 mL/hr over 60 Minutes Intravenous Every 12 hours 12/18/21 1901     12/18/21 2000  metroNIDAZOLE (FLAGYL) IVPB 500 mg        500 mg 100 mL/hr over 60 Minutes Intravenous Every 12 hours 12/18/21 1901           Note: Portions of this report may have been transcribed using voice recognition software. Every effort was made to ensure accuracy; however, inadvertent computerized transcription errors may be present.   Any transcriptional errors that result from this process are unintentional.    [name], MD, FACS, MASCRS Esophageal, Gastrointestinal & Colorectal Surgery Robotic and Minimally Invasive Surgery  [facility], [place]  [place]. [address] [place], [place]  [address] [phone] Fax [phone] Main  CONTACT INFORMATION:  Weekday (9AM-5PM): Call [facility] main office at [phone]  Weeknight (5PM-9AM) or Weekend/Holiday: Check [email or web address] (password " [email or web address]") for General Surgery [facility] coverage  (Please, do not use SecureChat as it is not reliable communication to operating surgeons for immediate patient care)       12/18/2021  7:09 PM

## 2021-12-18 NOTE — Assessment & Plan Note (Signed)
 #)   Acute cholecystitis: Diagnosis on the basis of 1 day of progressive right upper quadrant abdominal discomfort, with postprandial exacerbation associated with nausea/vomiting, with CT abdomen/pelvis showing evidence of gallbladder distention without CBD dilation or choledocholithiasis, while right quadrant ultrasound also shows distended gallbladder positive sonographic Murphy sign increased gallbladder wall thickening and no evidence of, duct dilation or choledocholithiasis.  Liver enzymes also confer a noncholestatic pattern.  Clinically no evidence of ascending chondritis at this time.  In the absence of any fever or leukocytosis, SIRS criteria not currently met for sepsis.  No evidence of hypotension.  Ciprofloxacin and IV Flagyl initiated in the ED this evening.  In the context of paroxysmal atrial fibrillation she is on daily Xarelto for associated thromboembolic prophylaxis, noting most recent dose of such occurring on 12/17/2021 at approximately 2100.  Otherwise, not on any blood thinners as an outpatient.  EDP discussed the patient's case with on-call general surgeon, Dr. Gross, Who will formally consult and has evaluated the patient, with additional recommendations to follow, including consideration for percutaneous drain placement versus laparoscopic cholecystectomy as management of presenting acute cholecystitis.  Gupta Score for this patient in the context of anticipated aforementioned orthopedic surgery conveys a  0.61% perioperative risk for significant cardiac event. No evidence to suggest acutely decompensated heart failure or acute MI. Consequently, no absolute contraindications to proceeding with proposed orthopedic surgery at this time.    Plan: General surgery consulted, as above.  Continue ciprofloxacin and IV Flagyl.  N.p.o. repeat CMP and CBC in the morning.  Add on serum magnesium level.  Check INR.  Prn IV Dilaudid.  As needed IV Zofran.  Gentle IV fluids in the form of LR at  75 cc/h x 8 hours.  Monitor strict I's and O's and weights.  Holding home Xarelto.  Overall, refraining from pharmacologic DVT prophylaxis.  SCDs.   

## 2021-12-18 NOTE — Assessment & Plan Note (Signed)
  #)   Paroxysmal atrial fibrillation: Documented history of such. In setting of CHA2DS2-VASc score of 8, there is an indication for chronic anticoagulation for thromboembolic prophylaxis. Consistent with this, patient is chronically anticoagulated on Xarelto. Home AV nodal blocking regimen: None.  Most recent echocardiogram was performed in April 2022, notable for mildly dilated left atrium, moderately dilated right atrium, trivial which regurgitation moderate tricuspid regurgitation, with additional results as conveyed above. Presenting EKG reflects sinus tachycardia, without overt evidence of acute ischemic changes.  In the setting of presenting acute cholecystitis, with plan for surgical intervention, will hold home Xarelto, as noted above.   Plan: monitor strict I's & O's and daily weights. Repeat CMP/CBC in AM. Check serum mag level.  Hold home Xarelto for now, as above.

## 2021-12-18 NOTE — ED Triage Notes (Signed)
Pt coming from home via EMS with c/o abdominal pain, nausea, and emesis starting this morning.

## 2021-12-18 NOTE — Assessment & Plan Note (Signed)
 #)   Type 2 Diabetes Mellitus: documented history of such. Home insulin regimen: None. Home oral hypoglycemic agents: Empagliflozin, linagliptin. presenting blood sugar: 121. Most recent A1c noted to be 5.3% on 11/03/2021.  Plan: In setting of current n.p.o. status, will pursue Accu-Cheks every 6 hours with very low-dose sliding scale insulin.hold home oral hypoglycemic agents during this hospitalization.

## 2021-12-18 NOTE — H&P (Signed)
History and Physical    PLEASE NOTE THAT DRAGON DICTATION SOFTWARE WAS USED IN THE CONSTRUCTION OF THIS NOTE.   [name] MPN:[id number] DOB: 06-27-1946 DOA: 12/18/2021  PCP: [name], MD  Patient coming from: home   I have personally briefly reviewed patient's old medical records in [place]  Chief Complaint: Abdominal pain   HPI: [name] is a 76 y.o. female with medical history significant for paroxysmal atrial fibrillation chronically anticoagulated on Xarelto, moderate persistent asthma, type 2 diabetes mellitus, essential hypertension, who is admitted to [facility] on 12/18/2021 with acute cholecystitis after presenting from home to [facility] ED complaining of abdominal pain.   The patient reports 1 day of progressive sharp, nonradiating right upper quadrant abdominal discomfort, worse with palpation and worse with attempts to eat or drink anything over that timeframe.  She notes associated intermittent nausea resulting in 1-2 episodes of nonbloody, nonbilious emesis.  Continues to produce flatus, and no recent diarrhea, melena, or hematochezia.  Denies any associated subjective fever, chills, rigors, generalized myalgias.  No recent trauma or travel.  Not associate any dysuria or gross materia.  Denies any known history of coronary artery disease or CHF. denies any recent orthopnea, PND, or peripheral edema.  No recent chest pain, shortness of breath, palpitations, diaphoresis.   history notable for paroxysmal atrial fibrillation chronically anticoagulated on Xarelto, with most recent dose occurring around 2100 on 12/17/21.  Otherwise on no additional blood thinners, including no aspirin.  Most recent echocardiogram occurred on 11/07/2020 was notable for LVEF 60 to 65%, indeterminate diastolic parameters, normal right ventricular systolic function, mildly dilated left atrium, moderately dilated right atrium, trivial mitral vegetation, moderate tricuspid  regurgitation.  Not on a scheduled diuretic medications at home.     ED Course:  Vital signs in the ED were notable for the following: Temperature max 99.0; heart rate 88-1 10; initial blood pressure 168/90, which subsequently increased to 125/71 following interval improvement in pain control following multiple doses of IV morphine, as below; respiratory rate 14-21, oxygen saturation 94 to 100% on room air.  Labs were notable for the following: CMP notable for the following: Sodium 142, potassium 3.8, creatinine 0.84, glucose 121, liver enzymes within normal limits.  Lipase 23.  Serum magnesium level 2.1.  CBC notable for white cell count 7500, hemoglobin 11.9 compared to most recent prior hemoglobin data point of 10.3 on 10/16/2021, platelet count 221.  Urinalysis showed no white blood cells, leukocyte esterase, nitrate negative.  Imaging and additional notable ED work-up: EKG shows sinus tachycardia with heart rate 108, normal intervals, nonspecific T wave inversion limited to aVL, and no evidence of ST changes, including no evidence of ST elevation.  CT abdomen/pelvis with contrast showed multiple gallstones within the gallbladder associated with gallbladder distention, but no evidence of common bile duct dilation and no evidence of choledocholithiasis also demonstrating no evidence of bowel obstruction, perforation, or abscess.  Greater quadrant ultrasound showed distended gallbladder containing multiple stones with small amount of layering of sludge within the gallbladder and associated with gallbladder wall thickening as well as positive sonographic Murphy sign in the absence of evidence for common bile duct dilation, with CBD measuring 3 mm, and no evidence of choledocholithiasis.  EDP discussed the patient's case with on-call general surgeon, Dr. Johney Maine, Who will formally consult and has evaluated the patient, with additional recommendations to follow, including consideration for percutaneous drain  placement versus laparoscopic cholecystectomy as management of presenting acute cholecystitis.  While in the ED, the following were administered: Morphine 4 mg IV x2 doses, ciprofloxacin, IV Flagyl.  Subsequently, the patient was admitted for further evaluation and treatment of acute cholecystitis.     Review of Systems: As per HPI otherwise 10 point review of systems negative.   Past Medical History:  Diagnosis Date   Allergic rhinitis    Allergy    Arthritis    Asthma    Chronic headache    Diabetes mellitus    DVT (deep venous thrombosis) (HCC)    Dyspnea    Heart murmur    Hypertension    Penicillin allergy 01/19/2020   Pulmonary embolism (HCC)    Pulmonary hypertension (HCC)    Seizures ([place])     Past Surgical History:  Procedure Laterality Date   ABDOMINAL HYSTERECTOMY     partial, has ovaries   CARDIAC CATHETERIZATION  12/28/2010   Mod. pulmonary hypertension, normal coronary arteries   CARDIOVERSION N/A 11/23/2020   Procedure: CARDIOVERSION;  Surgeon: Sanda Klein, MD;  Location: [facility];  Service: Cardiovascular;  Laterality: N/A;   DOPPLER ECHOCARDIOGRAPHY  10/08/2011   EF=>55%,mild asymmetric LVH, mod. TR, mod. PH, mild to mod LA dilatation   IR LUMBAR DISC ASPIRATION W/IMG GUIDE  01/12/2020   KNEE ARTHROSCOPY Left    KNEE SURGERY     Nuclear Stress Test  05/20/2006   No ischemia   PARTIAL HYSTERECTOMY     PLANTAR FASCIA SURGERY     TONSILLECTOMY      Social History:  reports that she has never smoked. She has never used smokeless tobacco. She reports that she does not currently use alcohol. She reports that she does not use drugs.   Allergies  Allergen Reactions   Latex Hives   Oxycodone Nausea And Vomiting and Nausea Only    Other reaction(s): Abdominal Pain, Vomiting   Penicillins Swelling    Has patient had a PCN reaction causing immediate rash, facial/tongue/throat swelling, SOB or lightheadedness with hypotension patient had a PCN reaction  causing severe rash involving mucus membranes or skin necrosis: [id number] Has patient had a PCN reaction that required hospitalization/No Has patient had a PCN reaction occurring within the last 10 years: No If all of the above answers are "NO", then may proceed with Cephalosporin use.    Other reaction(s): vomiting   Acetaminophen-Codeine Nausea Only    Other reaction(s): Vomiting   Dilantin [Phenytoin]     Other reaction(s): facial swelling   Hydrocodone-Acetaminophen Nausea And Vomiting   Hydrocodone Nausea And Vomiting   Nickel Rash    Other reaction(s): hives   Phenytoin Sodium Extended Swelling   Pregabalin Other (See Comments)    Blurred vision. Other reaction(s): headaches/problem w/vision    Family History  Problem Relation Age of Onset   Hypertension Mother    Clotting disorder Mother    Breast cancer Mother    Arthritis Mother    Stroke Mother    Diabetes Mother    Cancer Brother    Alcohol abuse Father    Arthritis Sister    Diabetes Sister    Multiple sclerosis Sister    Allergies Other        grandson   Allergic rhinitis Neg Hx    Angioedema Neg Hx    Asthma Neg Hx    Atopy Neg Hx    Eczema Neg Hx    Immunodeficiency Neg Hx    Urticaria Neg Hx     Family history reviewed and not pertinent  Prior to Admission medications   Medication Sig Start Date End Date Taking? Authorizing Provider  XARELTO 20 MG TABS tablet TAKE 1 TABLET(20 MG) BY MOUTH DAILY WITH SUPPER Patient taking differently: 20 mg daily after supper. 09/01/21  Yes Croitoru, Mihai, MD  Acetaminophen-Codeine 300-30 MG tablet Take 1 tablet by mouth every 6 (six) hours as needed for pain. Patient taking differently: Take by mouth every 6 (six) hours as needed for pain. 08/01/21   Bedsole, Amy E, MD  albuterol (VENTOLIN HFA) 108 (90 Base) MCG/ACT inhaler INHALE 2 PUFFS BY MOUTH EVERY 6 HOURS AS NEEDED FOR WHEEZING OR SHORTNESS OF BREATH 12/12/21   Kozlow, Donnamarie Poag, MD  atorvastatin (LIPITOR)  10 MG tablet TAKE 1 TABLET(10 MG) BY MOUTH DAILY 05/14/21   Bedsole, Amy E, MD  Blood Glucose Monitoring Suppl (ONETOUCH VERIO) w/Device KIT Use to check blood sugar up to 2 times a day 08/16/20   Diona Browner, Amy E, MD  cyclobenzaprine (FLEXERIL) 5 MG tablet Take 1-2 tablets (5-10 mg total) by mouth 3 (three) times daily as needed for muscle spasms. 08/10/21   Gareth Morgan, MD  dextromethorphan-guaiFENesin (MUCINEX DM) 30-600 MG 12hr tablet Take 1 tablet by mouth 2 (two) times daily.    [provider]  diclofenac Sodium (VOLTAREN) 1 % GEL Apply 2 g topically 4 (four) times daily. 03/21/21   Bedsole, Amy E, MD  EPINEPHrine 0.3 mg/0.3 mL IJ SOAJ injection USE AS DIRECTED FOR LIFE THREATENING ALLERGIC REACTIONS 12/12/21   Kozlow, Donnamarie Poag, MD  famotidine (PEPCID) 40 MG tablet Take 1 (ONE) tablet by mouth every evening. 12/12/21   Kozlow, Donnamarie Poag, MD  fluticasone (FLOVENT HFA) 220 MCG/ACT inhaler 2 puffs 2 times daily during Flare Up. 12/12/21   Kozlow, Donnamarie Poag, MD  fluticasone-salmeterol (ADVAIR HFA) 230-21 MCG/ACT inhaler INHALE 2 PUFFS BY MOUTH TWICE DAILY TO PREVENT COUGH OR WHEEZING. RINSE MOUTH AFTER USE 12/12/21   Kozlow, Donnamarie Poag, MD  furosemide (LASIX) 20 MG tablet Take 20 mg as needed for a weight gain of 3 pounds overnight or 5 pounds in one week,. 06/23/21   Croitoru, Mihai, MD  gabapentin (NEURONTIN) 600 MG tablet TAKE 1 TABLET BY MOUTH EVERY DAY AT BREAKFAST, 1 TABLET AT LUNCH AND 2 TABLETS AT BEDTIME 03/27/21   Hyatt, Max T, DPM  GLYXAMBI 10-5 MG TABS TAKE 1 TABLET BY MOUTH DAILY 09/10/21   Bedsole, Amy E, MD  Homeopathic Products [facility] RELIEF EX) Apply 1 spray topically daily as needed (muscle cramps).    [provider]  HYDROmorphone (DILAUDID) 2 MG tablet Take 2 mg by mouth every 4 (four) hours as needed for severe pain.    [provider]  hydrOXYzine (ATARAX/VISTARIL) 10 MG tablet Take 1 tablet (10 mg total) by mouth daily as needed for itching. 04/18/21   Bedsole, Amy  E, MD  ipratropium (ATROVENT) 0.06 % nasal spray 1-2 sprays in each nostril 1-2 times er day as needed to dry nose 12/12/21   Kozlow, Donnamarie Poag, MD  irbesartan (AVAPRO) 150 MG tablet Take 1 tablet (150 mg total) by mouth daily. 09/18/21   Croitoru, Mihai, MD  Lactase (DAIRY-RELIEF PO) Take 1 capsule by mouth daily as needed (eating dairy).     [provider]  Lancets (ONETOUCH DELICA PLUS [id number]) [place] USE TO CHECK BLOOD SUGAR UP TO TWICE DAILY 10/20/20   Bedsole, Amy E, MD  levalbuterol (XOPENEX) 1.25 MG/3ML nebulizer solution USE 1 VIAL VIA NEBULIZER EVERY 6 HOURS AS NEEDED FOR SHORTNESS OF  BREATH OR WHEEZING 12/12/21   Kozlow, Donnamarie Poag, MD  liver oil-zinc oxide (DESITIN) 40 % ointment Apply 1 application topically as needed for irritation.    [provider]  Menthol-Methyl Salicylate (SALONPAS JET SPRAY EX) Apply 1 spray topically daily as needed (knee pain).    [provider]  methimazole (TAPAZOLE) 5 MG tablet Take 1 tablet (5 mg total) by mouth daily. 06/01/21   Philemon Kingdom, MD  montelukast (SINGULAIR) 10 MG tablet Take 1 tablet (10 mg total) by mouth at bedtime. For asthma control. 12/12/21   Kozlow, Donnamarie Poag, MD  Nutritional Supplements (NUTRITIONAL SUPPLEMENT PO) Take by mouth. Omega XL - Take 2 gel capsules 2 times a day    [provider]  nystatin (MYCOSTATIN) 100000 UNIT/ML suspension 23m swish and swallow after Advair 2 times daily. 12/12/21   Kozlow, EDonnamarie Poag MD  ondansetron (ZOFRAN-ODT) 4 MG disintegrating tablet Take 1 tablet (4 mg total) by mouth every 8 (eight) hours as needed for nausea or vomiting. 04/18/21   Bedsole, Amy E, MD  ondansetron (ZOFRAN-ODT) 8 MG disintegrating tablet Take 1 tablet (8 mg total) by mouth every 8 (eight) hours as needed for nausea or vomiting. 10/16/21   ALacretia Leigh MD  ONETOUCH VERIO test strip USE TO CHECK BLOOD SUGAR UP TO TWICE DAILY 10/20/20   Bedsole, Amy E, MD  oxybutynin (DITROPAN-XL) 10 MG 24 hr tablet Take 10 mg by  mouth daily. 12/10/21   [provider]  oxyCODONE-acetaminophen (PERCOCET/ROXICET) 5-325 MG tablet Take 1-2 tablets by mouth every 6 (six) hours as needed for severe pain. 10/16/21   ALacretia Leigh MD  pantoprazole (PROTONIX) 40 MG tablet Take 1 (ONE) tablet by mouth every morning. 12/12/21   Kozlow, EDonnamarie Poag MD  PATADAY 0.2 % SOLN Place 1 drop into both eyes daily as needed (allergies).  01/27/17   [provider]  Polyethyl Glycol-Propyl Glycol (SYSTANE OP) Place 1 drop into both eyes in the morning and at bedtime.    [provider]  potassium chloride SA (KLOR-CON) 20 MEQ tablet Take 1 tablet (20 mEq total) by mouth as needed (Take one daily when you need to take a Furosemide). 12/22/20   Croitoru, Mihai, MD  Tiotropium Bromide Monohydrate (SPIRIVA RESPIMAT) 1.25 MCG/ACT AERS Inhale 2 puffs into the lungs daily. 12/12/21   Kozlow, EDonnamarie Poag MD  TRULANCE 3 MG TABS Take 3 mg by mouth every other day. 12/06/18   [provider]     Objective    Physical Exam: Vitals:   12/18/21 1900 12/18/21 1909 12/18/21 2015 12/18/21 2115  BP: (!) 161/82  110/66 125/71  Pulse: (!) 110  (!) 104 100  Resp: 20  (!) 23 14  Temp:  99 F (37.2 C)    TempSrc:  Oral    SpO2: 100%  93% 94%  Weight:      Height:        General: appears to be stated age; alert, oriented Skin: warm, dry, no rash Head:  AT/[place] Mouth:  Oral mucosa membranes appear dry, normal dentition Neck: supple; trachea midline Heart:  RRR; did not appreciate any M/R/G Lungs: CTAB, did not appreciate any wheezes, rales, or rhonchi Abdomen: + BS; soft, ND, mild tenderness to the right upper quadrant, in the absence of any associated guarding, rigidity, or rebound tenderness Vascular: 2+ pedal pulses b/l; 2+ radial pulses b/l Extremities: no peripheral edema, no muscle wasting Neuro: strength and sensation intact in upper and lower extremities b/l  Labs on Admission: I have personally reviewed following labs  and imaging studies  CBC: Recent Labs  Lab 12/18/21 1425  WBC 7.5  HGB 11.9*  HCT 36.1  MCV 78.6*  PLT 350   Basic Metabolic Panel: Recent Labs  Lab 12/18/21 1425 12/18/21 2018  NA 142  --   K 3.8  --   CL 112*  --   CO2 24  --   GLUCOSE 121*  --   BUN 12  --   CREATININE 0.84  --   CALCIUM 8.1*  --   MG  --  2.1   GFR: Estimated Creatinine Clearance: 60.5 mL/min (by C-G formula based on SCr of 0.84 mg/dL). Liver Function Tests: Recent Labs  Lab 12/18/21 1425  AST 22  ALT 9  ALKPHOS 74  BILITOT 0.7  PROT 6.3*  ALBUMIN 3.4*   Recent Labs  Lab 12/18/21 1425  LIPASE 23   No results for input(s): AMMONIA in the last 168 hours. Coagulation Profile: No results for input(s): INR, PROTIME in the last 168 hours. Cardiac Enzymes: No results for input(s): CKTOTAL, CKMB, CKMBINDEX, TROPONINI in the last 168 hours. BNP (last 3 results) No results for input(s): PROBNP in the last 8760 hours. HbA1C: No results for input(s): HGBA1C in the last 72 hours. CBG: No results for input(s): GLUCAP in the last 168 hours. Lipid Profile: No results for input(s): CHOL, HDL, LDLCALC, TRIG, CHOLHDL, LDLDIRECT in the last 72 hours. Thyroid Function Tests: No results for input(s): TSH, T4TOTAL, FREET4, T3FREE, THYROIDAB in the last 72 hours. Anemia Panel: No results for input(s): VITAMINB12, FOLATE, FERRITIN, TIBC, IRON, RETICCTPCT in the last 72 hours. Urine analysis:    Component Value Date/Time   COLORURINE STRAW (A) 12/18/2021 1551   APPEARANCEUR HAZY (A) 12/18/2021 1551   LABSPEC 1.009 12/18/2021 1551   PHURINE 8.0 12/18/2021 1551   GLUCOSEU >=500 (A) 12/18/2021 1551   HGBUR NEGATIVE 12/18/2021 1551   BILIRUBINUR NEGATIVE 12/18/2021 1551   BILIRUBINUR negative 11/26/2016 1211   KETONESUR NEGATIVE 12/18/2021 1551   PROTEINUR NEGATIVE 12/18/2021 1551   UROBILINOGEN 1.0 04/13/2019 1544   NITRITE NEGATIVE 12/18/2021 1551   LEUKOCYTESUR NEGATIVE 12/18/2021 1551     Radiological Exams on Admission: CT ABDOMEN PELVIS W CONTRAST  Result Date: 12/18/2021 CLINICAL DATA:  Abdominal pain, nausea, vomiting EXAM: CT ABDOMEN AND PELVIS WITH CONTRAST TECHNIQUE: Multidetector CT imaging of the abdomen and pelvis was performed using the standard protocol following bolus administration of intravenous contrast. RADIATION DOSE REDUCTION: This exam was performed according to the departmental dose-optimization program which includes automated exposure control, adjustment of the mA and/or kV according to patient size and/or use of iterative reconstruction technique. CONTRAST:  158m OMNIPAQUE IOHEXOL 300 MG/ML  SOLN COMPARISON:  11/26/2016 FINDINGS: Lower chest: Heart is enlarged in size. No focal infiltrates are seen in the visualized lower lung fields. Hepatobiliary: No focal abnormality is seen in the liver. There are multiple gallbladder stones. Gallbladder is distended. There is no significant wall thickening in gallbladder. Pancreas: No focal abnormality is seen. Spleen: Unremarkable. Adrenals/Urinary Tract: Adrenals are unremarkable. There is no hydronephrosis. There is 2.5 cm cyst in the lower pole of right kidney. There are no renal or ureteral stones. Urinary bladder is distended. Length of the bladder is proximally 17.6 cm with dome of the bladder at the level of umbilicus. There is no wall thickening in the bladder. Stomach/Bowel: Stomach is unremarkable. Small bowel loops not dilated. Appendix is not distinctly seen. There is no pericecal  inflammation. There is no significant wall thickening in the colon. Vascular/Lymphatic: Unremarkable. Reproductive: Uterus is not seen. There are multiple dense calcifications in the right side of pelvis. There is no definite associated soft tissue mass. This finding has not changed significantly. These may suggest calcified lymph nodes or calcifications in the right adnexa. Other: There is no ascites or pneumoperitoneum. Small umbilical  hernia containing fat is seen. Musculoskeletal: There is marked sclerosis in the bodies of L4, L5 and S1 vertebrae. Irregularity is seen in the endplates of L4, L5 and S1 vertebrae. There is no definite demonstrable fluid collection in the paraspinal region. Schmorl's node is seen in the lower endplate of body of L1 vertebra. Degenerative changes are noted in the visualized lower thoracic spine. IMPRESSION: There is no evidence intestinal obstruction or pneumoperitoneum. There is no hydronephrosis. Multiple gallbladder stones. Gallbladder is distended. If there are focal symptoms in the right upper quadrant follow-up gallbladder sonogram may be considered. There is interval appearance of marked sclerosis in the L4, L5 and S1 vertebrae along with irregularities in the endplates adjacent to the disc spaces. Possibility of chronic discitis is not excluded. Please correlate with clinical symptoms and consider follow-up MRI. There is marked distention of the urinary bladder with upper margin of dome of the bladder at the level of umbilicus. Other findings as described in the body of the report. Electronically Signed   By: Elmer Picker M.D.   On: 12/18/2021 16:10   US Abdomen Limited RUQ (LIVER/GB)  Result Date: 12/18/2021 CLINICAL DATA:  Abdominal pain EXAM: ULTRASOUND ABDOMEN LIMITED RIGHT UPPER QUADRANT COMPARISON:  Same day CT FINDINGS: Gallbladder: Distended gallbladder containing multiple stones, largest measuring up to 2.2 cm. Small amount of layering biliary sludge within the gallbladder. Gallbladder wall thickening of up to 4.4 mm. A positive sonographic Percell Miller sign was indicated by the technologist. Common bile duct: Diameter: 3 mm. Liver: No focal lesion identified. Within normal limits in parenchymal echogenicity. Portal vein is patent on color Doppler imaging with normal direction of blood flow towards the liver. Other: None. IMPRESSION: Sonographic features suggestive of acute calculus  cholecystitis. Electronically Signed   By: Davina Poke D.O.   On: 12/18/2021 17:07     EKG: Independently reviewed, with result as described above.    Assessment/Plan    Principal Problem:   Acute calculous cholecystitis Active Problems:   Pulmonary hypertension (HCC)   Asthma, moderate persistent   History of pulmonary embolism   Diabetes mellitus with neuropathy (HCC)   Essential hypertension   GERD (gastroesophageal reflux disease)   Paroxysmal atrial fibrillation (HCC)   Mild obstructive sleep apnea   Obstructive sleep apnea treated with continuous positive airway pressure (CPAP)   Acute cholecystitis   Chronic anticoagulation - Xerelto   Hyperthyroidism       #) Acute cholecystitis: Diagnosis on the basis of 1 day of progressive right upper quadrant abdominal discomfort, with postprandial exacerbation associated with nausea/vomiting, with CT abdomen/pelvis showing evidence of gallbladder distention without CBD dilation or choledocholithiasis, while right quadrant ultrasound also shows distended gallbladder positive sonographic Murphy sign increased gallbladder wall thickening and no evidence of, duct dilation or choledocholithiasis.  Liver enzymes also confer a noncholestatic pattern.  Clinically no evidence of ascending chondritis at this time.  In the absence of any fever or leukocytosis, SIRS criteria not currently met for sepsis.  No evidence of hypotension.  Ciprofloxacin and IV Flagyl initiated in the ED this evening.  In the context of paroxysmal atrial fibrillation she  is on daily Xarelto for associated thromboembolic prophylaxis, noting most recent dose of such occurring on 12/17/2021 at approximately 2100.  Otherwise, not on any blood thinners as an outpatient.  EDP discussed the patient's case with on-call general surgeon, Dr. Johney Maine, Who will formally consult and has evaluated the patient, with additional recommendations to follow, including consideration for  percutaneous drain placement versus laparoscopic cholecystectomy as management of presenting acute cholecystitis.  Gupta Score for this patient in the context of anticipated aforementioned orthopedic surgery conveys a  0.61% perioperative risk for significant cardiac event. No evidence to suggest acutely decompensated heart failure or acute MI. Consequently, no absolute contraindications to proceeding with proposed orthopedic surgery at this time.    Plan: General surgery consulted, as above.  Continue ciprofloxacin and IV Flagyl.  N.p.o. repeat CMP and CBC in the morning.  Add on serum magnesium level.  Check INR.  Prn IV Dilaudid.  As needed IV Zofran.  Gentle IV fluids in the form of LR at 75 cc/h x 8 hours.  Monitor strict I's and O's and weights.  Holding home Xarelto.  Overall, refraining from pharmacologic DVT prophylaxis.  SCDs.          #) Paroxysmal atrial fibrillation: Documented history of such. In setting of CHA2DS2-VASc score of 8, there is an indication for chronic anticoagulation for thromboembolic prophylaxis. Consistent with this, patient is chronically anticoagulated on Xarelto. Home AV nodal blocking regimen: None.  Most recent echocardiogram was performed in April 2022, notable for mildly dilated left atrium, moderately dilated right atrium, trivial which regurgitation moderate tricuspid regurgitation, with additional results as conveyed above. Presenting EKG reflects sinus tachycardia, without overt evidence of acute ischemic changes.  In the setting of presenting acute cholecystitis, with plan for surgical intervention, will hold home Xarelto, as noted above.   Plan: monitor strict I's & O's and daily weights. Repeat CMP/CBC in AM. Check serum mag level.  Hold home Xarelto for now, as above.        #) Moderate persistent asthma: Documented history of such, without clinical evidence to suggest acute exacerbation at this time.  Outpatient respiratory regimen includes  scheduled Advair, scheduled Spiriva, as needed albuterol inhaler, in addition to the Singulair.  Plan: Continue outpatient scheduled respiratory regimen.  As needed albuterol nebulizer.  Add on serum magnesium level.          #) Essential Hypertension: documented h/o such, with outpatient antihypertensive regimen including irbesartan.  SBP's in the ED today: Initially hypertensive, with initial systolic blood pressures in the 160s, subsequently improving into the 120s following improvement in interval pain control with multiple doses of IV analgesia, as further detailed above.  No evidence of hypotension..   Plan: Close monitoring of subsequent BP via routine VS. in the context of current n.p.o. status, will hold home irbesartan for now.           #) Type 2 Diabetes Mellitus: documented history of such. Home insulin regimen: None. Home oral hypoglycemic agents: Empagliflozin, linagliptin. presenting blood sugar: 121. Most recent A1c noted to be 5.3% on 11/03/2021.  Plan: In setting of current n.p.o. status, will pursue Accu-Cheks every 6 hours with very low-dose sliding scale insulin.hold home oral hypoglycemic agents during this hospitalization.           #) GERD: documented h/o such; on Protonix as well as Pepcid as outpatient.   Plan: In the setting of current n.p.o. status, will transiently convert oral Protonix to IV route of administration, daily frequency.  Hold home Pepcid for now.         #) Hyperthyroidism: Documented history of such, on methimazole as an outpatient.  Plan: Continue outpatient methimazole        DVT prophylaxis: SCD's   Code Status: Full code Family Communication: none Disposition Plan: Per Rounding Team Consults called: On-call general surgery, Dr. Johney Maine consulted, as further detailed above;  Admission status: Inpatient   PLEASE NOTE THAT DRAGON DICTATION SOFTWARE WAS USED IN THE CONSTRUCTION OF THIS NOTE.   Aulander DO [facility] [place]   12/18/2021, 9:44 PM

## 2021-12-19 ENCOUNTER — Telehealth: Payer: Self-pay | Admitting: Allergy

## 2021-12-19 DIAGNOSIS — I480 Paroxysmal atrial fibrillation: Secondary | ICD-10-CM | POA: Diagnosis not present

## 2021-12-19 DIAGNOSIS — I10 Essential (primary) hypertension: Secondary | ICD-10-CM | POA: Diagnosis not present

## 2021-12-19 DIAGNOSIS — K8000 Calculus of gallbladder with acute cholecystitis without obstruction: Secondary | ICD-10-CM | POA: Diagnosis not present

## 2021-12-19 DIAGNOSIS — D72829 Elevated white blood cell count, unspecified: Secondary | ICD-10-CM

## 2021-12-19 DIAGNOSIS — J4540 Moderate persistent asthma, uncomplicated: Secondary | ICD-10-CM | POA: Diagnosis not present

## 2021-12-19 LAB — CBC WITH DIFFERENTIAL/PLATELET
Abs Immature Granulocytes: 0.12 10*3/uL — ABNORMAL HIGH (ref 0.00–0.07)
Basophils Absolute: 0.1 10*3/uL (ref 0.0–0.1)
Basophils Relative: 0 %
Eosinophils Absolute: 0.1 10*3/uL (ref 0.0–0.5)
Eosinophils Relative: 0 %
HCT: 36.8 % (ref 36.0–46.0)
Hemoglobin: 12 g/dL (ref 12.0–15.0)
Immature Granulocytes: 1 %
Lymphocytes Relative: 9 %
Lymphs Abs: 2 10*3/uL (ref 0.7–4.0)
MCH: 25.6 pg — ABNORMAL LOW (ref 26.0–34.0)
MCHC: 32.6 g/dL (ref 30.0–36.0)
MCV: 78.5 fL — ABNORMAL LOW (ref 80.0–100.0)
Monocytes Absolute: 1.6 10*3/uL — ABNORMAL HIGH (ref 0.1–1.0)
Monocytes Relative: 7 %
Neutro Abs: 17.7 10*3/uL — ABNORMAL HIGH (ref 1.7–7.7)
Neutrophils Relative %: 83 %
Platelets: 288 10*3/uL (ref 150–400)
RBC: 4.69 MIL/uL (ref 3.87–5.11)
RDW: 16.8 % — ABNORMAL HIGH (ref 11.5–15.5)
WBC: 21.5 10*3/uL — ABNORMAL HIGH (ref 4.0–10.5)
nRBC: 0 % (ref 0.0–0.2)

## 2021-12-19 LAB — COMPREHENSIVE METABOLIC PANEL
ALT: 13 U/L (ref 0–44)
AST: 25 U/L (ref 15–41)
Albumin: 3.5 g/dL (ref 3.5–5.0)
Alkaline Phosphatase: 80 U/L (ref 38–126)
Anion gap: 7 (ref 5–15)
BUN: 12 mg/dL (ref 8–23)
CO2: 25 mmol/L (ref 22–32)
Calcium: 8.6 mg/dL — ABNORMAL LOW (ref 8.9–10.3)
Chloride: 107 mmol/L (ref 98–111)
Creatinine, Ser: 1 mg/dL (ref 0.44–1.00)
GFR, Estimated: 59 mL/min — ABNORMAL LOW (ref >60)
Glucose, Bld: 136 mg/dL — ABNORMAL HIGH (ref 70–99)
Potassium: 4.1 mmol/L (ref 3.5–5.1)
Sodium: 139 mmol/L (ref 135–145)
Total Bilirubin: 1.6 mg/dL — ABNORMAL HIGH (ref 0.3–1.2)
Total Protein: 6.4 g/dL — ABNORMAL LOW (ref 6.5–8.1)

## 2021-12-19 LAB — MAGNESIUM: Magnesium: 2.4 mg/dL (ref 1.7–2.4)

## 2021-12-19 LAB — CBG MONITORING, ED: Glucose-Capillary: 137 mg/dL — ABNORMAL HIGH (ref 70–99)

## 2021-12-19 LAB — GLUCOSE, CAPILLARY
Glucose-Capillary: 127 mg/dL — ABNORMAL HIGH (ref 70–99)
Glucose-Capillary: 132 mg/dL — ABNORMAL HIGH (ref 70–99)
Glucose-Capillary: 139 mg/dL — ABNORMAL HIGH (ref 70–99)

## 2021-12-19 LAB — PROTIME-INR
INR: 1.8 — ABNORMAL HIGH (ref 0.8–1.2)
Prothrombin Time: 20.4 seconds — ABNORMAL HIGH (ref 11.4–15.2)

## 2021-12-19 MED ORDER — UMECLIDINIUM BROMIDE 62.5 MCG/ACT IN AEPB
1.0000 | INHALATION_SPRAY | Freq: Every day | RESPIRATORY_TRACT | Status: DC
  Administered 2021-12-20 – 2021-12-23 (×3): 1 via RESPIRATORY_TRACT
  Filled 2021-12-19: qty 7

## 2021-12-19 MED ORDER — SODIUM CHLORIDE 0.9 % IV SOLN
2.0000 g | Freq: Two times a day (BID) | INTRAVENOUS | Status: DC
  Administered 2021-12-19 – 2021-12-21 (×4): 2 g via INTRAVENOUS
  Filled 2021-12-19 (×4): qty 12.5

## 2021-12-19 MED ORDER — LACTATED RINGERS IV SOLN: INTRAVENOUS | Status: DC

## 2021-12-19 NOTE — Progress Notes (Signed)
  Transition of Care [facility]) Screening Note   Patient Details  Name: [name] Date of Birth: [birth year]   Transition of Care [facility]) CM/SW Contact:    Dessa Phi, RN Phone Number: 12/19/2021, 9:49 AM    Transition of Care Department [facility]) has reviewed patient and no TOC needs have been identified at this time. We will continue to monitor patient advancement through interdisciplinary progression rounds. If new patient transition needs arise, please place a TOC consult.

## 2021-12-19 NOTE — ED Notes (Signed)
ED TO INPATIENT HANDOFF REPORT  ED Nurse Name and Phone #: Luz Brazen [phone]  S Name/Age/Gender [name] 76 y.o. female Room/Bed: WA21/WA21  Code Status   Code Status: Full Code  Home/SNF/Other Home Patient oriented to: self, place, time, and situation Is this baseline? Yes   Triage Complete: Triage complete  Chief Complaint Acute cholecystitis [K81.0]  Triage Note Pt coming from home via EMS with c/o abdominal pain, nausea, and emesis starting this morning.    Allergies Allergies  Allergen Reactions   Latex Hives   Oxycodone Nausea And Vomiting and Nausea Only    Other reaction(s): Abdominal Pain, Vomiting   Penicillins Swelling    Has patient had a PCN reaction causing immediate rash, facial/tongue/throat swelling, SOB or lightheadedness with hypotension patient had a PCN reaction causing severe rash involving mucus membranes or skin necrosis: [id number]} Has patient had a PCN reaction that required hospitalization/No Has patient had a PCN reaction occurring within the last 10 years: No If all of the above answers are "NO", then may proceed with Cephalosporin use.    Other reaction(s): vomiting   Acetaminophen-Codeine Nausea Only    Other reaction(s): Vomiting   Dilantin [Phenytoin]     Other reaction(s): facial swelling   Hydrocodone-Acetaminophen Nausea And Vomiting   Hydrocodone Nausea And Vomiting   Nickel Rash    Other reaction(s): hives   Phenytoin Sodium Extended Swelling   Pregabalin Other (See Comments)    Blurred vision. Other reaction(s): headaches/problem w/vision    Level of Care/Admitting Diagnosis ED Disposition     ED Disposition  Admit   Condition  --   Comment  Hospital Area: [place] [[id number]]  Level of Care: Progressive [102]  Admit to Progressive based on following criteria: MULTISYSTEM THREATS such as stable sepsis, metabolic/electrolyte imbalance with or without encephalopathy that is responding to  early treatment.  May [facility] patient to [facility] or [name] if equivalent level of care is available:: No  Covid Evaluation: Asymptomatic - no recent exposure (last 10 days) testing not required  Diagnosis: Acute cholecystitis [575.0.ICD-9-CM]  Admitting Physician: Rhetta Mura [[id number]]  Attending Physician: Rhetta Mura [[id number]]  Estimated length of stay: past midnight tomorrow  Certification:: I certify this patient will need inpatient services for at least 2 midnights          B Medical/Surgery History Past Medical History:  Diagnosis Date   Allergic rhinitis    Allergy    Arthritis    Asthma    Chronic headache    Diabetes mellitus    DVT (deep venous thrombosis) (HCC)    Dyspnea    Heart murmur    Hypertension    Penicillin allergy 01/19/2020   Pulmonary embolism ([place])    Pulmonary hypertension (HCC)    Seizures ([place])    Past Surgical History:  Procedure Laterality Date   ABDOMINAL HYSTERECTOMY     partial, has ovaries   CARDIAC CATHETERIZATION  12/28/2010   Mod. pulmonary hypertension, normal coronary arteries   CARDIOVERSION N/A 11/23/2020   Procedure: CARDIOVERSION;  Surgeon: Sanda Klein, MD;  Location: [place];  Service: Cardiovascular;  Laterality: N/A;   DOPPLER ECHOCARDIOGRAPHY  10/08/2011   EF=>55%,mild asymmetric LVH, mod. TR, mod. PH, mild to mod LA dilatation   IR LUMBAR DISC ASPIRATION W/IMG GUIDE  01/12/2020   KNEE ARTHROSCOPY Left    KNEE SURGERY     Nuclear Stress Test  05/20/2006   No ischemia   PARTIAL HYSTERECTOMY  PLANTAR FASCIA SURGERY     TONSILLECTOMY       A IV Location/Drains/Wounds Patient Lines/Drains/Airways Status     Active Line/Drains/Airways     Name Placement date Placement time Site Days   Peripheral IV 12/18/21 20 G Posterior;Right Forearm 12/18/21  --  Forearm  1   Incision (Closed) 01/12/20 Back Right;Lower 01/12/20  1123  -- 707            Intake/Output Last 24 hours  Intake/Output  Summary (Last 24 hours) at 12/19/2021 0301 Last data filed at 12/18/2021 2103 Gross per 24 hour  Intake 200 ml  Output --  Net 200 ml    Labs/Imaging Results for orders placed or performed during the hospital encounter of 12/18/21 (from the past 48 hour(s))  Lipase, blood     Status: None   Collection Time: 12/18/21  2:25 PM  Result Value Ref Range   Lipase 23 11 - 51 U/L    Comment: Performed at [facility], [place] [address], [place], [place] [address]  Comprehensive metabolic panel     Status: Abnormal   Collection Time: 12/18/21  2:25 PM  Result Value Ref Range   Sodium 142 135 - 145 mmol/L   Potassium 3.8 3.5 - 5.1 mmol/L   Chloride 112 (H) 98 - 111 mmol/L   CO2 24 22 - 32 mmol/L   Glucose, Bld 121 (H) 70 - 99 mg/dL    Comment: Glucose reference range applies only to samples taken after fasting for at least 8 hours.   BUN 12 8 - 23 mg/dL   Creatinine, Ser 0.84 0.44 - 1.00 mg/dL   Calcium 8.1 (L) 8.9 - 10.3 mg/dL   Total Protein 6.3 (L) 6.5 - 8.1 g/dL   Albumin 3.4 (L) 3.5 - 5.0 g/dL   AST 22 15 - 41 U/L   ALT 9 0 - 44 U/L   Alkaline Phosphatase 74 38 - 126 U/L   Total Bilirubin 0.7 0.3 - 1.2 mg/dL   GFR, Estimated >60 >60 mL/min    Comment: (NOTE) Calculated using the CKD-EPI Creatinine Equation (2021)    Anion gap 6 5 - 15    Comment: Performed at [facility], [place] [address], [place], [place] [address]  CBC     Status: Abnormal   Collection Time: 12/18/21  2:25 PM  Result Value Ref Range   WBC 7.5 4.0 - 10.5 K/uL   RBC 4.59 3.87 - 5.11 MIL/uL   Hemoglobin 11.9 (L) 12.0 - 15.0 g/dL   HCT 36.1 36.0 - 46.0 %   MCV 78.6 (L) 80.0 - 100.0 fL   MCH 25.9 (L) 26.0 - 34.0 pg   MCHC 33.0 30.0 - 36.0 g/dL   RDW 16.6 (H) 11.5 - 15.5 %   Platelets 221 150 - 400 K/uL   nRBC 0.0 0.0 - 0.2 %    Comment: Performed at [facility], [place] [address], [place],  [address]  Urinalysis, Routine w reflex microscopic Urine,  Clean Catch     Status: Abnormal   Collection Time: 12/18/21  3:51 PM  Result Value Ref Range   Color, Urine STRAW (A) YELLOW   APPearance HAZY (A) CLEAR   Specific Gravity, Urine 1.009 1.005 - 1.030   pH 8.0 5.0 - 8.0   Glucose, UA >=500 (A) NEGATIVE mg/dL   Hgb urine dipstick NEGATIVE NEGATIVE   Bilirubin Urine NEGATIVE NEGATIVE   Ketones, ur NEGATIVE NEGATIVE mg/dL   Protein,  ur NEGATIVE NEGATIVE mg/dL   Nitrite NEGATIVE NEGATIVE   Leukocytes,Ua NEGATIVE NEGATIVE   RBC / HPF 0-5 0 - 5 RBC/hpf   WBC, UA 0-5 0 - 5 WBC/hpf   Bacteria, UA RARE (A) NONE SEEN   Squamous Epithelial / LPF 0-5 0 - 5   Mucus PRESENT     Comment: Performed at [facility], [place] [address] [place], [place] [address]  Magnesium     Status: None   Collection Time: 12/18/21  8:18 PM  Result Value Ref Range   Magnesium 2.1 1.7 - 2.4 mg/dL    Comment: Performed at [facility], [place] [address] [place], [place] [address]  CBG monitoring, ED     Status: Abnormal   Collection Time: 12/19/21 12:29 AM  Result Value Ref Range   Glucose-Capillary 137 (H) 70 - 99 mg/dL    Comment: Glucose reference range applies only to samples taken after fasting for at least 8 hours.   Comment 1 Notify RN    Comment 2 Document in Chart    *Note: Due to a large number of results and/or encounters for the requested time period, some results have not been displayed. A complete set of results can be found in Results Review.   CT ABDOMEN PELVIS W CONTRAST  Result Date: 12/18/2021 CLINICAL DATA:  Abdominal pain, nausea, vomiting EXAM: CT ABDOMEN AND PELVIS WITH CONTRAST TECHNIQUE: Multidetector CT imaging of the abdomen and pelvis was performed using the standard protocol following bolus administration of intravenous contrast. RADIATION DOSE REDUCTION: This exam was performed according to the departmental dose-optimization program which includes automated exposure control, adjustment of the mA and/or  kV according to patient size and/or use of iterative reconstruction technique. CONTRAST:  169m OMNIPAQUE IOHEXOL 300 MG/ML  SOLN COMPARISON:  11/26/2016 FINDINGS: Lower chest: Heart is enlarged in size. No focal infiltrates are seen in the visualized lower lung fields. Hepatobiliary: No focal abnormality is seen in the liver. There are multiple gallbladder stones. Gallbladder is distended. There is no significant wall thickening in gallbladder. Pancreas: No focal abnormality is seen. Spleen: Unremarkable. Adrenals/Urinary Tract: Adrenals are unremarkable. There is no hydronephrosis. There is 2.5 cm cyst in the lower pole of right kidney. There are no renal or ureteral stones. Urinary bladder is distended. Length of the bladder is proximally 17.6 cm with dome of the bladder at the level of umbilicus. There is no wall thickening in the bladder. Stomach/Bowel: Stomach is unremarkable. Small bowel loops not dilated. Appendix is not distinctly seen. There is no pericecal inflammation. There is no significant wall thickening in the colon. Vascular/Lymphatic: Unremarkable. Reproductive: Uterus is not seen. There are multiple dense calcifications in the right side of pelvis. There is no definite associated soft tissue mass. This finding has not changed significantly. These may suggest calcified lymph nodes or calcifications in the right adnexa. Other: There is no ascites or pneumoperitoneum. Small umbilical hernia containing fat is seen. Musculoskeletal: There is marked sclerosis in the bodies of L4, L5 and S1 vertebrae. Irregularity is seen in the endplates of L4, L5 and S1 vertebrae. There is no definite demonstrable fluid collection in the paraspinal region. Schmorl's node is seen in the lower endplate of body of L1 vertebra. Degenerative changes are noted in the visualized lower thoracic spine. IMPRESSION: There is no evidence intestinal obstruction or pneumoperitoneum. There is no hydronephrosis. Multiple gallbladder  stones. Gallbladder is distended. If there are focal symptoms in the right upper quadrant follow-up gallbladder sonogram  may be considered. There is interval appearance of marked sclerosis in the L4, L5 and S1 vertebrae along with irregularities in the endplates adjacent to the disc spaces. Possibility of chronic discitis is not excluded. Please correlate with clinical symptoms and consider follow-up MRI. There is marked distention of the urinary bladder with upper margin of dome of the bladder at the level of umbilicus. Other findings as described in the body of the report. Electronically Signed   By: Elmer Picker M.D.   On: 12/18/2021 16:10   US Abdomen Limited RUQ (LIVER/GB)  Result Date: 12/18/2021 CLINICAL DATA:  Abdominal pain EXAM: ULTRASOUND ABDOMEN LIMITED RIGHT UPPER QUADRANT COMPARISON:  Same day CT FINDINGS: Gallbladder: Distended gallbladder containing multiple stones, largest measuring up to 2.2 cm. Small amount of layering biliary sludge within the gallbladder. Gallbladder wall thickening of up to 4.4 mm. A positive sonographic Percell Miller sign was indicated by the technologist. Common bile duct: Diameter: 3 mm. Liver: No focal lesion identified. Within normal limits in parenchymal echogenicity. Portal vein is patent on color Doppler imaging with normal direction of blood flow towards the liver. Other: None. IMPRESSION: Sonographic features suggestive of acute calculus cholecystitis. Electronically Signed   By: Davina Poke D.O.   On: 12/18/2021 17:07    Pending Labs Unresulted Labs (From admission, onward)     Start     Ordered   12/19/21 0500  CBC with Differential/Platelet  Tomorrow morning,   R        12/18/21 2001   12/19/21 0500  Comprehensive metabolic panel  Tomorrow morning,   R        12/18/21 2001   12/19/21 0500  Magnesium  Tomorrow morning,   R        12/18/21 2001   12/18/21 2137  Protime-INR  Add-on,   AD        12/18/21 2137            Vitals/Pain Today's  Vitals   12/18/21 2112 12/18/21 2115 12/19/21 0015 12/19/21 0104  BP:  125/71 (!) 143/73   Pulse:  100 (!) 105   Resp:  14 (!) 23   Temp:      TempSrc:      SpO2:  94% 93%   Weight:      Height:      PainSc: 10-Worst pain ever   7     Isolation Precautions No active isolations  Medications Medications  lactated ringers bolus 1,000 mL (has no administration in time range)  ciprofloxacin (CIPRO) IVPB 400 mg (0 mg Intravenous Stopped 12/18/21 2103)  metroNIDAZOLE (FLAGYL) IVPB 500 mg (0 mg Intravenous Stopped 12/18/21 2102)  HYDROmorphone (DILAUDID) injection 0.5-2 mg (1 mg Intravenous Given 12/18/21 2111)  methocarbamol (ROBAXIN) 1,000 mg in dextrose 5 % 100 mL IVPB (has no administration in time range)  acetaminophen (TYLENOL) tablet 500 mg (500 mg Oral Given 12/19/21 0102)  acetaminophen (TYLENOL) suppository 650 mg (has no administration in time range)  ondansetron (ZOFRAN) injection 4 mg (has no administration in time range)    Or  ondansetron (ZOFRAN) 8 mg in sodium chloride 0.9 % 50 mL IVPB (has no administration in time range)  prochlorperazine (COMPAZINE) injection 5-10 mg (has no administration in time range)  lip balm (CARMEX) ointment 1 application. (1 application. Topical Not Given 12/18/21 2002)  phenol (CHLORASEPTIC) mouth spray 2 spray (has no administration in time range)  menthol-cetylpyridinium (CEPACOL) lozenge 3 mg (has no administration in time range)  magic mouthwash (has no administration  in time range)  simethicone (MYLICON) 40 WI/2.0BT suspension 80 mg (has no administration in time range)  alum & mag hydroxide-simeth (MAALOX/MYLANTA) 200-200-20 MG/5ML suspension 30 mL (has no administration in time range)  bisacodyl (DULCOLAX) suppository 10 mg (has no administration in time range)  diphenhydrAMINE (BENADRYL) injection 12.5-25 mg (has no administration in time range)  metoprolol tartrate (LOPRESSOR) injection 5 mg (has no administration in time range)  enalaprilat  (VASOTEC) injection 0.625-1.25 mg (has no administration in time range)  acetaminophen (TYLENOL) tablet 650 mg (has no administration in time range)    Or  acetaminophen (TYLENOL) suppository 650 mg (has no administration in time range)  lactated ringers infusion ( Intravenous New Bag/Given 12/18/21 2213)  albuterol (PROVENTIL) (2.5 MG/3ML) 0.083% nebulizer solution 2.5 mg (has no administration in time range)  mometasone-formoterol (DULERA) 200-5 MCG/ACT inhaler 2 puff (2 puffs Inhalation Not Given 12/18/21 2145)  methimazole (TAPAZOLE) tablet 5 mg (has no administration in time range)  montelukast (SINGULAIR) tablet 10 mg (10 mg Oral Given 12/19/21 0103)  pantoprazole (PROTONIX) injection 40 mg (has no administration in time range)  insulin aspart (novoLOG) injection 0-6 Units (0 Units Subcutaneous Not Given 12/19/21 0058)  umeclidinium bromide (INCRUSE ELLIPTA) 62.5 MCG/ACT 1 puff (has no administration in time range)  morphine (PF) 4 MG/ML injection 4 mg (4 mg Intravenous Given 12/18/21 1350)  morphine (PF) 4 MG/ML injection 4 mg (4 mg Intravenous Given 12/18/21 1503)  iohexol (OMNIPAQUE) 300 MG/ML solution 100 mL (100 mLs Intravenous Contrast Given 12/18/21 1532)    Mobility  Low fall risk   Focused Assessments    R Recommendations: See Admitting Provider Note  Report given to:   Additional Notes:

## 2021-12-19 NOTE — ED Notes (Signed)
Handoff given to Joanna RN

## 2021-12-19 NOTE — Progress Notes (Signed)
PROGRESS NOTE    [name]  AXE:[id number] DOB: [birth year] DOA: 12/18/2021 PCP: [name], MD   Brief Narrative:  76 year old female with history of paroxysmal A-fib on Xarelto, moderate persistent asthma, diabetes mellitus type 2, essential hypertension presented with worsening abdominal pain.  On presentation, WBC was 7500, lipase 23, LFTs normal.  CT abdomen/pelvis with contrast showed multiple gallstones within the gallbladder associated with gallbladder distention, no evidence of common bile duct dilatation or choledocholithiasis.  Right upper quadrant ultrasound showed distended gallbladder with multiple stones and associated gallbladder wall thickening as well as positive sonographic Murphy sign.  She was started on IV antibiotics and fluids.  General surgery was consulted.  Assessment & Plan:   Acute calculus cholecystitis -Imaging findings as above continue ciprofloxacin and Flagyl.  General surgery following.  Timing of surgery to be decided by general surgery. -Continue IV fluids and as needed analgesics/antiemetics.  Leukocytosis -Monitor  Paroxysmal A-fib -Rate controlled.  Xarelto on hold.  Last dose on 12/17/2021.  Moderate persistent asthma -Stable.  Continue current inhaled regimen.  Patient's hypertension -Blood pressure stable.  Antihypertensives on hold  Hyperthyroidism -Continue methimazole  Diabetes mellitus type 2 -Home oral meds on hold.  Most recent A1c was 5.3 on 11/03/2021.  Continue CBGs with SSI.  Obesity -Outpatient follow-up   DVT prophylaxis: SCDs Code Status: Full Family Communication: None at bedside Disposition Plan: Status is: Inpatient Remains inpatient appropriate because: Of severity of illness    Consultants: General surgery  Procedures: None  Antimicrobials: Cipro and Flagyl from 12/18/21 onwards   Subjective: Patient seen and examined at bedside.  Complains of intermittent upper abdominal pain with nausea.  No fever,  chest pain, worsening shortness of breath reported.  Objective: Vitals:   12/19/21 0315 12/19/21 0351 12/19/21 0500 12/19/21 0822  BP: 126/66 105/89  140/67  Pulse: 96 94  99  Resp: (!) 27 20    Temp:  98.4 F (36.9 C)  99.4 F (37.4 C)  TempSrc:  Oral  Oral  SpO2: 96% 98%  97%  Weight:   86.4 kg   Height:        Intake/Output Summary (Last 24 hours) at 12/19/2021 1142 Last data filed at 12/19/2021 0840 Gross per 24 hour  Intake 768.91 ml  Output 600 ml  Net 168.91 ml   Filed Weights   12/18/21 1331 12/19/21 0500  Weight: 83.5 kg 86.4 kg    Examination:  General exam: Appears calm and comfortable.  Currently on room air. Respiratory system: Bilateral decreased breath sounds at bases Cardiovascular system: S1 & S2 heard, Rate controlled Gastrointestinal system: Abdomen is distended, soft and tender in the epigastric and right upper quadrant regions.  No rebound tenderness.  Normal bowel sounds heard. Extremities: No cyanosis, clubbing, edema  Central nervous system: Alert and oriented. No focal neurological deficits. Moving extremities Skin: No rashes, lesions or ulcers Psychiatry: Judgement and insight appear normal. Mood & affect appropriate.     Data Reviewed: I have personally reviewed following labs and imaging studies  CBC: Recent Labs  Lab 12/18/21 1425 12/19/21 0526  WBC 7.5 21.5*  NEUTROABS  --  17.7*  HGB 11.9* 12.0  HCT 36.1 36.8  MCV 78.6* 78.5*  PLT 221 110   Basic Metabolic Panel: Recent Labs  Lab 12/18/21 1425 12/18/21 2018 12/19/21 0526  NA 142  --  139  K 3.8  --  4.1  CL 112*  --  107  CO2 24  --  25  GLUCOSE 121*  --  136*  BUN 12  --  12  CREATININE 0.84  --  1.00  CALCIUM 8.1*  --  8.6*  MG  --  2.1 2.4   GFR: Estimated Creatinine Clearance: 51.7 mL/min (by C-G formula based on SCr of 1 mg/dL). Liver Function Tests: Recent Labs  Lab 12/18/21 1425 12/19/21 0526  AST 22 25  ALT 9 13  ALKPHOS 74 80  BILITOT 0.7 1.6*  PROT  6.3* 6.4*  ALBUMIN 3.4* 3.5   Recent Labs  Lab 12/18/21 1425  LIPASE 23   No results for input(s): AMMONIA in the last 168 hours. Coagulation Profile: Recent Labs  Lab 12/19/21 0526  INR 1.8*   Cardiac Enzymes: No results for input(s): CKTOTAL, CKMB, CKMBINDEX, TROPONINI in the last 168 hours. BNP (last 3 results) No results for input(s): PROBNP in the last 8760 hours. HbA1C: No results for input(s): HGBA1C in the last 72 hours. CBG: Recent Labs  Lab 12/19/21 0029 12/19/21 0607  GLUCAP 137* 127*   Lipid Profile: No results for input(s): CHOL, HDL, LDLCALC, TRIG, CHOLHDL, LDLDIRECT in the last 72 hours. Thyroid Function Tests: No results for input(s): TSH, T4TOTAL, FREET4, T3FREE, THYROIDAB in the last 72 hours. Anemia Panel: No results for input(s): VITAMINB12, FOLATE, FERRITIN, TIBC, IRON, RETICCTPCT in the last 72 hours. Sepsis Labs: No results for input(s): PROCALCITON, LATICACIDVEN in the last 168 hours.  Recent Results (from the past 240 hour(s))  WET PREP BY MOLECULAR PROBE     Status: None   Collection Time: 12/12/21  5:02 PM   Specimen: Genital  Result Value Ref Range Status   MICRO NUMBER: [id number]  Final   SPECIMEN QUALITY: Adequate  Final   SOURCE: VAGINA  Final   STATUS: FINAL  Final   Trichomonas vaginosis Not Detected  Final   Gardnerella vaginalis Not Detected  Final   Candida species Not Detected  Final         Radiology Studies: CT ABDOMEN PELVIS W CONTRAST  Result Date: 12/18/2021 CLINICAL DATA:  Abdominal pain, nausea, vomiting EXAM: CT ABDOMEN AND PELVIS WITH CONTRAST TECHNIQUE: Multidetector CT imaging of the abdomen and pelvis was performed using the standard protocol following bolus administration of intravenous contrast. RADIATION DOSE REDUCTION: This exam was performed according to the departmental dose-optimization program which includes automated exposure control, adjustment of the mA and/or kV according to patient size and/or use of  iterative reconstruction technique. CONTRAST:  127m OMNIPAQUE IOHEXOL 300 MG/ML  SOLN COMPARISON:  11/26/2016 FINDINGS: Lower chest: Heart is enlarged in size. No focal infiltrates are seen in the visualized lower lung fields. Hepatobiliary: No focal abnormality is seen in the liver. There are multiple gallbladder stones. Gallbladder is distended. There is no significant wall thickening in gallbladder. Pancreas: No focal abnormality is seen. Spleen: Unremarkable. Adrenals/Urinary Tract: Adrenals are unremarkable. There is no hydronephrosis. There is 2.5 cm cyst in the lower pole of right kidney. There are no renal or ureteral stones. Urinary bladder is distended. Length of the bladder is proximally 17.6 cm with dome of the bladder at the level of umbilicus. There is no wall thickening in the bladder. Stomach/Bowel: Stomach is unremarkable. Small bowel loops not dilated. Appendix is not distinctly seen. There is no pericecal inflammation. There is no significant wall thickening in the colon. Vascular/Lymphatic: Unremarkable. Reproductive: Uterus is not seen. There are multiple dense calcifications in the right side of pelvis. There is no definite associated soft tissue mass. This finding has not changed  significantly. These may suggest calcified lymph nodes or calcifications in the right adnexa. Other: There is no ascites or pneumoperitoneum. Small umbilical hernia containing fat is seen. Musculoskeletal: There is marked sclerosis in the bodies of L4, L5 and S1 vertebrae. Irregularity is seen in the endplates of L4, L5 and S1 vertebrae. There is no definite demonstrable fluid collection in the paraspinal region. Schmorl's node is seen in the lower endplate of body of L1 vertebra. Degenerative changes are noted in the visualized lower thoracic spine. IMPRESSION: There is no evidence intestinal obstruction or pneumoperitoneum. There is no hydronephrosis. Multiple gallbladder stones. Gallbladder is distended. If there  are focal symptoms in the right upper quadrant follow-up gallbladder sonogram may be considered. There is interval appearance of marked sclerosis in the L4, L5 and S1 vertebrae along with irregularities in the endplates adjacent to the disc spaces. Possibility of chronic discitis is not excluded. Please correlate with clinical symptoms and consider follow-up MRI. There is marked distention of the urinary bladder with upper margin of dome of the bladder at the level of umbilicus. Other findings as described in the body of the report. Electronically Signed   By: Elmer Picker M.D.   On: 12/18/2021 16:10   US Abdomen Limited RUQ (LIVER/GB)  Result Date: 12/18/2021 CLINICAL DATA:  Abdominal pain EXAM: ULTRASOUND ABDOMEN LIMITED RIGHT UPPER QUADRANT COMPARISON:  Same day CT FINDINGS: Gallbladder: Distended gallbladder containing multiple stones, largest measuring up to 2.2 cm. Small amount of layering biliary sludge within the gallbladder. Gallbladder wall thickening of up to 4.4 mm. A positive sonographic Percell Miller sign was indicated by the technologist. Common bile duct: Diameter: 3 mm. Liver: No focal lesion identified. Within normal limits in parenchymal echogenicity. Portal vein is patent on color Doppler imaging with normal direction of blood flow towards the liver. Other: None. IMPRESSION: Sonographic features suggestive of acute calculus cholecystitis. Electronically Signed   By: Davina Poke D.O.   On: 12/18/2021 17:07        Scheduled Meds:  acetaminophen  500 mg Oral TID WC & HS   insulin aspart  0-6 Units Subcutaneous Q6H   lip balm  1 application. Topical BID   methimazole  5 mg Oral Daily   mometasone-formoterol  2 puff Inhalation BID   montelukast  10 mg Oral QHS   pantoprazole (PROTONIX) IV  40 mg Intravenous Q24H   umeclidinium bromide  1 puff Inhalation Daily   Continuous Infusions:  ciprofloxacin 400 mg (12/19/21 0840)   lactated ringers     methocarbamol (ROBAXIN) IV      metronidazole 500 mg (12/19/21 1023)   ondansetron (ZOFRAN) IV            Aline August, MD [facility] 12/19/2021, 11:42 AM

## 2021-12-19 NOTE — Progress Notes (Signed)
Pharmacy Antibiotic Note  [name] is a 76 y.o. female admitted on 12/18/2021 with  IAI .  Pharmacy has been consulted for cefepime dosing.  Plan: Cefepime 2g IV q12 per current renal function  Height: '5\' 4"'$  (162.6 cm) Weight: 86.4 kg (190 lb 9.4 oz) IBW/kg (Calculated) : 54.7  Temp (24hrs), Avg:98.6 F (37 C), Min:97.9 F (36.6 C), Max:99.4 F (37.4 C)  Recent Labs  Lab 12/18/21 1425 12/19/21 0526  WBC 7.5 21.5*  CREATININE 0.84 1.00    Estimated Creatinine Clearance: 51.7 mL/min (by C-G formula based on SCr of 1 mg/dL).    Allergies  Allergen Reactions   Dilantin [Phenytoin] Swelling    Other reaction(s): facial swelling   Latex Hives   Oxycodone Nausea And Vomiting and Nausea Only    Other reaction(s): Abdominal Pain, Vomiting   Penicillins Swelling    Has patient had a PCN reaction causing immediate rash, facial/tongue/throat swelling, SOB or lightheadedness with hypotension patient had a PCN reaction causing severe rash involving mucus membranes or skin necrosis: ON:[id number]} Has patient had a PCN reaction that required hospitalization/No Has patient had a PCN reaction occurring within the last 10 years: No If all of the above answers are "NO", then may proceed with Cephalosporin use.    Other reaction(s): vomiting   Codeine Nausea Only    Tolerates tylenol with codeine   Hydrocodone Nausea And Vomiting   Nickel Hives and Rash   Pregabalin Other (See Comments)    Blurred vision. Other reaction(s): headaches/problem w/vision     Thank you for allowing pharmacy to be a part of this patient's care.  Kara Mead 12/19/2021 12:46 PM

## 2021-12-19 NOTE — Telephone Encounter (Signed)
Pt called after hours hoping to reach Dr. Neldon Mc.  She states she is going to be having gallbladder surgery in the morning and will need to be "put completely under" for general anesthesia.  She states Dr. Neldon Mc has told her she should "not be put completely under" for surgery.  I advised Dr Neldon Mc was not on call this afternoon and would send him this message.  Also advised there not really a way to to gallbladder removal without complete general anesthesia.  Advised she should take her routine inhalers (or equivalent they have in the hospital).  She thanked me for returning her call.

## 2021-12-19 NOTE — Progress Notes (Signed)
[facility] Progress Note     Subjective: CC:  Abd pain described as better compared to yesterday. She is having dry heaves during my exam.   States she lives alone. States she had knee surgery in march and denies issues under general anesthesia. Since that time her neighbor has been helping her out by bringing her meals and helping drive her places. States she is able to perform ADLs, do some laundry and cooking, was recently cleared to drive short distances by her orthopedic surgeon. Denies chest pain with mobilization.   Objective: Vital signs in last 24 hours: Temp:  [97.9 F (36.6 C)-99.4 F (37.4 C)] 99.4 F (37.4 C) (06/06 0822) Pulse Rate:  [89-115] 99 (06/06 0822) Resp:  [14-30] 20 (06/06 0351) BP: (105-170)/(66-134) 140/67 (06/06 0822) SpO2:  [93 %-100 %] 97 % (06/06 0822) Weight:  [83.5 kg-86.4 kg] 86.4 kg (06/06 0500)    Intake/Output from previous day: 06/05 0701 - 06/06 0700 In: 768.9 [I.V.:568.9; IV Piggyback:200] Out: -  Intake/Output this shift: Total I/O In: -  Out: 600 [Urine:600]  PE: Gen:  Alert, NAD, pleasant and cooperative.  Pulm:  Normal effort ORA  Abd: Soft, globally tender, worse over upper abdomen, involuntary guarding, palpable gallbladder in RUQ with +murphy's sign.  Skin: warm and dry, no rashes  Psych: A&Ox3   Lab Results:  Recent Labs    12/18/21 1425 12/19/21 0526  WBC 7.5 21.5*  HGB 11.9* 12.0  HCT 36.1 36.8  PLT 221 288   BMET Recent Labs    12/18/21 1425 12/19/21 0526  NA 142 139  K 3.8 4.1  CL 112* 107  CO2 24 25  GLUCOSE 121* 136*  BUN 12 12  CREATININE 0.84 1.00  CALCIUM 8.1* 8.6*   PT/INR Recent Labs    12/19/21 0526  LABPROT 20.4*  INR 1.8*   CMP     Component Value Date/Time   NA 139 12/19/2021 0526   NA 140 11/21/2020 1026   K 4.1 12/19/2021 0526   CL 107 12/19/2021 0526   CO2 25 12/19/2021 0526   GLUCOSE 136 (H) 12/19/2021 0526   BUN 12 12/19/2021 0526   BUN 16 11/21/2020 1026    CREATININE 1.00 12/19/2021 0526   CREATININE 1.01 (H) 12/30/2020 1635   CALCIUM 8.6 (L) 12/19/2021 0526   PROT 6.4 (L) 12/19/2021 0526   ALBUMIN 3.5 12/19/2021 0526   AST 25 12/19/2021 0526   ALT 13 12/19/2021 0526   ALKPHOS 80 12/19/2021 0526   BILITOT 1.6 (H) 12/19/2021 0526   GFRNONAA 59 (L) 12/19/2021 0526   GFRNONAA 53 (L) 01/04/2020 1059   GFRAA 62 01/04/2020 1059   Lipase     Component Value Date/Time   LIPASE 23 12/18/2021 1425       Studies/Results: CT ABDOMEN PELVIS W CONTRAST  Result Date: 12/18/2021 CLINICAL DATA:  Abdominal pain, nausea, vomiting EXAM: CT ABDOMEN AND PELVIS WITH CONTRAST TECHNIQUE: Multidetector CT imaging of the abdomen and pelvis was performed using the standard protocol following bolus administration of intravenous contrast. RADIATION DOSE REDUCTION: This exam was performed according to the departmental dose-optimization program which includes automated exposure control, adjustment of the mA and/or kV according to patient size and/or use of iterative reconstruction technique. CONTRAST:  13m OMNIPAQUE IOHEXOL 300 MG/ML  SOLN COMPARISON:  11/26/2016 FINDINGS: Lower chest: Heart is enlarged in size. No focal infiltrates are seen in the visualized lower lung fields. Hepatobiliary: No focal abnormality is seen in the liver. There are multiple  gallbladder stones. Gallbladder is distended. There is no significant wall thickening in gallbladder. Pancreas: No focal abnormality is seen. Spleen: Unremarkable. Adrenals/Urinary Tract: Adrenals are unremarkable. There is no hydronephrosis. There is 2.5 cm cyst in the lower pole of right kidney. There are no renal or ureteral stones. Urinary bladder is distended. Length of the bladder is proximally 17.6 cm with dome of the bladder at the level of umbilicus. There is no wall thickening in the bladder. Stomach/Bowel: Stomach is unremarkable. Small bowel loops not dilated. Appendix is not distinctly seen. There is no  pericecal inflammation. There is no significant wall thickening in the colon. Vascular/Lymphatic: Unremarkable. Reproductive: Uterus is not seen. There are multiple dense calcifications in the right side of pelvis. There is no definite associated soft tissue mass. This finding has not changed significantly. These may suggest calcified lymph nodes or calcifications in the right adnexa. Other: There is no ascites or pneumoperitoneum. Small umbilical hernia containing fat is seen. Musculoskeletal: There is marked sclerosis in the bodies of L4, L5 and S1 vertebrae. Irregularity is seen in the endplates of L4, L5 and S1 vertebrae. There is no definite demonstrable fluid collection in the paraspinal region. Schmorl's node is seen in the lower endplate of body of L1 vertebra. Degenerative changes are noted in the visualized lower thoracic spine. IMPRESSION: There is no evidence intestinal obstruction or pneumoperitoneum. There is no hydronephrosis. Multiple gallbladder stones. Gallbladder is distended. If there are focal symptoms in the right upper quadrant follow-up gallbladder sonogram may be considered. There is interval appearance of marked sclerosis in the L4, L5 and S1 vertebrae along with irregularities in the endplates adjacent to the disc spaces. Possibility of chronic discitis is not excluded. Please correlate with clinical symptoms and consider follow-up MRI. There is marked distention of the urinary bladder with upper margin of dome of the bladder at the level of umbilicus. Other findings as described in the body of the report. Electronically Signed   By: Elmer Picker M.D.   On: 12/18/2021 16:10   US Abdomen Limited RUQ (LIVER/GB)  Result Date: 12/18/2021 CLINICAL DATA:  Abdominal pain EXAM: ULTRASOUND ABDOMEN LIMITED RIGHT UPPER QUADRANT COMPARISON:  Same day CT FINDINGS: Gallbladder: Distended gallbladder containing multiple stones, largest measuring up to 2.2 cm. Small amount of layering biliary  sludge within the gallbladder. Gallbladder wall thickening of up to 4.4 mm. A positive sonographic Percell Miller sign was indicated by the technologist. Common bile duct: Diameter: 3 mm. Liver: No focal lesion identified. Within normal limits in parenchymal echogenicity. Portal vein is patent on color Doppler imaging with normal direction of blood flow towards the liver. Other: None. IMPRESSION: Sonographic features suggestive of acute calculus cholecystitis. Electronically Signed   By: Davina Poke D.O.   On: 12/18/2021 17:07    Anti-infectives: Anti-infectives (From admission, onward)    Start     Dose/Rate Route Frequency Ordered Stop   12/18/21 2000  ciprofloxacin (CIPRO) IVPB 400 mg        400 mg 200 mL/hr over 60 Minutes Intravenous Every 12 hours 12/18/21 1901     12/18/21 2000  metroNIDAZOLE (FLAGYL) IVPB 500 mg        500 mg 100 mL/hr over 60 Minutes Intravenous Every 12 hours 12/18/21 1901          Assessment/Plan Acute calculous cholecystitis - afebrile, vitals are stable, WBC up to 21.5 today from 7 despite IV cipro/flagyl - total bili is 1.6 from 0.7, AST/ALT/Alk Phos are all WNL, lipase was normal  yesterday  - INR is 1.8 from 1.4 - the patient is currently hemodynamically stable but has palpable gallbladder and positive murphy's sign on exam and on RUQ U/S. Significant leukocytosis. Per [facility] her cardiac and pulmonary comorbidities are stable, without exacerbation, and are not prohibitive for surgery. She has a history of a.fib on xarelto (last dose 6/4 PM) but not CHF or CAD.    Will discuss the laparoscopic cholecystectomy vs percutaneous cholecystostomy tube with Dr. Harlow Asa. The patient would likely tolerate surgery from a cardiac/pulmonary perspective but, given her deconditioning/decrease in mobility after recent knee surgery, and the amount of inflammatory changes present on her labs/imaging/physical exam, perc chole tube and interval cholecystectomy is not unreasonable. There  are no emergent indications for surgery/procedure at this time as the patient is hemodynamically stable.   FEN: NPO, IVF ID: cipro/flagyl 6/5, broaden abx - will discuss with pharmacy given PCN allergy  VTE: SCD's Foley: none  Dispo: med-surg, IV abx, surgical planning   Per [facility] -- Moderate persistent asthma  Afib on xarelto - hold anticoagulation  DM2  HTN   I reviewed nursing notes, hospitalist notes, last 24 h vitals and pain scores, last 48 h intake and output, last 24 h labs and trends, and last 24 h imaging results.   Obie Dredge, PA-C [facility] Please see Amion for pager number during day hours 7:00am-4:30pm

## 2021-12-20 ENCOUNTER — Inpatient Hospital Stay (HOSPITAL_COMMUNITY): Payer: Medicare Other | Admitting: Certified Registered Nurse Anesthetist

## 2021-12-20 ENCOUNTER — Other Ambulatory Visit: Payer: Self-pay

## 2021-12-20 ENCOUNTER — Ambulatory Visit: Admit: 2021-12-20 | Payer: Medicare Other | Admitting: Surgery

## 2021-12-20 ENCOUNTER — Encounter (HOSPITAL_COMMUNITY): Payer: Self-pay | Admitting: Internal Medicine

## 2021-12-20 ENCOUNTER — Encounter (HOSPITAL_COMMUNITY)
Admission: EM | Disposition: A | Discharge status: Home / Self Care | Payer: Self-pay | Source: Home / Self Care | Attending: Internal Medicine

## 2021-12-20 ENCOUNTER — Inpatient Hospital Stay (HOSPITAL_COMMUNITY): Payer: Medicare Other

## 2021-12-20 DIAGNOSIS — K810 Acute cholecystitis: Secondary | ICD-10-CM

## 2021-12-20 DIAGNOSIS — K8000 Calculus of gallbladder with acute cholecystitis without obstruction: Secondary | ICD-10-CM | POA: Diagnosis not present

## 2021-12-20 DIAGNOSIS — E119 Type 2 diabetes mellitus without complications: Secondary | ICD-10-CM

## 2021-12-20 DIAGNOSIS — I10 Essential (primary) hypertension: Secondary | ICD-10-CM

## 2021-12-20 DIAGNOSIS — I4891 Unspecified atrial fibrillation: Secondary | ICD-10-CM

## 2021-12-20 HISTORY — PX: CHOLECYSTECTOMY: SHX55

## 2021-12-20 LAB — COMPREHENSIVE METABOLIC PANEL
ALT: 12 U/L (ref 0–44)
AST: 33 U/L (ref 15–41)
Albumin: 3.1 g/dL — ABNORMAL LOW (ref 3.5–5.0)
Alkaline Phosphatase: 90 U/L (ref 38–126)
Anion gap: 11 (ref 5–15)
BUN: 12 mg/dL (ref 8–23)
CO2: 20 mmol/L — ABNORMAL LOW (ref 22–32)
Calcium: 8.9 mg/dL (ref 8.9–10.3)
Chloride: 113 mmol/L — ABNORMAL HIGH (ref 98–111)
Creatinine, Ser: 0.9 mg/dL (ref 0.44–1.00)
GFR, Estimated: >60 mL/min (ref >60)
Glucose, Bld: 94 mg/dL (ref 70–99)
Potassium: 4.5 mmol/L (ref 3.5–5.1)
Sodium: 144 mmol/L (ref 135–145)
Total Bilirubin: 2.4 mg/dL — ABNORMAL HIGH (ref 0.3–1.2)
Total Protein: 6.4 g/dL — ABNORMAL LOW (ref 6.5–8.1)

## 2021-12-20 LAB — CBC WITH DIFFERENTIAL/PLATELET
Abs Immature Granulocytes: 1.24 10*3/uL — ABNORMAL HIGH (ref 0.00–0.07)
Basophils Absolute: 0.1 10*3/uL (ref 0.0–0.1)
Basophils Relative: 0 %
Eosinophils Absolute: 0 10*3/uL (ref 0.0–0.5)
Eosinophils Relative: 0 %
HCT: 38.1 % (ref 36.0–46.0)
Hemoglobin: 12.3 g/dL (ref 12.0–15.0)
Immature Granulocytes: 4 %
Lymphocytes Relative: 5 %
Lymphs Abs: 1.6 10*3/uL (ref 0.7–4.0)
MCH: 25.7 pg — ABNORMAL LOW (ref 26.0–34.0)
MCHC: 32.3 g/dL (ref 30.0–36.0)
MCV: 79.7 fL — ABNORMAL LOW (ref 80.0–100.0)
Monocytes Absolute: 1.7 10*3/uL — ABNORMAL HIGH (ref 0.1–1.0)
Monocytes Relative: 6 %
Neutro Abs: 24.8 10*3/uL — ABNORMAL HIGH (ref 1.7–7.7)
Neutrophils Relative %: 85 %
Platelets: 276 10*3/uL (ref 150–400)
RBC: 4.78 MIL/uL (ref 3.87–5.11)
RDW: 17 % — ABNORMAL HIGH (ref 11.5–15.5)
WBC: 29.5 10*3/uL — ABNORMAL HIGH (ref 4.0–10.5)
nRBC: 0 % (ref 0.0–0.2)

## 2021-12-20 LAB — MAGNESIUM: Magnesium: 2.4 mg/dL (ref 1.7–2.4)

## 2021-12-20 LAB — GLUCOSE, CAPILLARY
Glucose-Capillary: 109 mg/dL — ABNORMAL HIGH (ref 70–99)
Glucose-Capillary: 113 mg/dL — ABNORMAL HIGH (ref 70–99)
Glucose-Capillary: 158 mg/dL — ABNORMAL HIGH (ref 70–99)
Glucose-Capillary: 257 mg/dL — ABNORMAL HIGH (ref 70–99)
Glucose-Capillary: 99 mg/dL (ref 70–99)

## 2021-12-20 SURGERY — LAPAROSCOPIC CHOLECYSTECTOMY WITH INTRAOPERATIVE CHOLANGIOGRAM
Anesthesia: General

## 2021-12-20 MED ORDER — OXYCODONE HCL 5 MG PO TABS: 5.0000 mg | ORAL_TABLET | ORAL | Status: DC | PRN

## 2021-12-20 MED ORDER — DEXAMETHASONE SODIUM PHOSPHATE 10 MG/ML IJ SOLN
INTRAMUSCULAR | Status: AC
  Filled 2021-12-20: qty 1

## 2021-12-20 MED ORDER — IOHEXOL 300 MG/ML  SOLN
INTRAMUSCULAR | Status: DC | PRN
  Administered 2021-12-20: 10 mL

## 2021-12-20 MED ORDER — TRAZODONE HCL 50 MG PO TABS: 50.0000 mg | ORAL_TABLET | Freq: Every evening | ORAL | Status: DC | PRN

## 2021-12-20 MED ORDER — DIPHENHYDRAMINE HCL 50 MG/ML IJ SOLN
12.5000 mg | Freq: Once | INTRAMUSCULAR | Status: AC
  Administered 2021-12-20: 12.5 mg via INTRAVENOUS

## 2021-12-20 MED ORDER — FENTANYL CITRATE PF 50 MCG/ML IJ SOSY
PREFILLED_SYRINGE | INTRAMUSCULAR | Status: AC
  Administered 2021-12-20: 50 ug via INTRAVENOUS
  Filled 2021-12-20: qty 2

## 2021-12-20 MED ORDER — CHLORHEXIDINE GLUCONATE CLOTH 2 % EX PADS: 6.0000 | MEDICATED_PAD | Freq: Once | CUTANEOUS | Status: DC

## 2021-12-20 MED ORDER — LIDOCAINE 2% (20 MG/ML) 5 ML SYRINGE
INTRAMUSCULAR | Status: DC | PRN
  Administered 2021-12-20: 60 mg via INTRAVENOUS

## 2021-12-20 MED ORDER — ONDANSETRON HCL 4 MG/2ML IJ SOLN
INTRAMUSCULAR | Status: AC
  Filled 2021-12-20: qty 2

## 2021-12-20 MED ORDER — ACETAMINOPHEN 325 MG PO TABS
650.0000 mg | ORAL_TABLET | Freq: Four times a day (QID) | ORAL | Status: DC
  Administered 2021-12-20 – 2021-12-23 (×12): 650 mg via ORAL
  Filled 2021-12-20 (×11): qty 2

## 2021-12-20 MED ORDER — METOPROLOL TARTRATE 5 MG/5ML IV SOLN
INTRAVENOUS | Status: DC | PRN
  Administered 2021-12-20: 2 mg via INTRAVENOUS

## 2021-12-20 MED ORDER — FENTANYL CITRATE (PF) 100 MCG/2ML IJ SOLN
INTRAMUSCULAR | Status: DC | PRN
  Administered 2021-12-20 (×3): 50 ug via INTRAVENOUS
  Administered 2021-12-20 (×2): 25 ug via INTRAVENOUS

## 2021-12-20 MED ORDER — ROCURONIUM BROMIDE 10 MG/ML (PF) SYRINGE
PREFILLED_SYRINGE | INTRAVENOUS | Status: AC
  Filled 2021-12-20: qty 10

## 2021-12-20 MED ORDER — LIDOCAINE HCL (PF) 2 % IJ SOLN
INTRAMUSCULAR | Status: AC
  Filled 2021-12-20: qty 5

## 2021-12-20 MED ORDER — IPRATROPIUM-ALBUTEROL 0.5-2.5 (3) MG/3ML IN SOLN: 3.0000 mL | RESPIRATORY_TRACT | Status: DC | PRN

## 2021-12-20 MED ORDER — PHENYLEPHRINE HCL (PRESSORS) 10 MG/ML IV SOLN
INTRAVENOUS | Status: AC
  Filled 2021-12-20: qty 1

## 2021-12-20 MED ORDER — TRAMADOL HCL 50 MG PO TABS
50.0000 mg | ORAL_TABLET | Freq: Four times a day (QID) | ORAL | Status: DC | PRN
  Administered 2021-12-20: 100 mg via ORAL
  Administered 2021-12-22: 50 mg via ORAL
  Filled 2021-12-20: qty 1
  Filled 2021-12-20: qty 2

## 2021-12-20 MED ORDER — HYDRALAZINE HCL 20 MG/ML IJ SOLN: 10.0000 mg | INTRAMUSCULAR | Status: DC | PRN

## 2021-12-20 MED ORDER — SENNOSIDES-DOCUSATE SODIUM 8.6-50 MG PO TABS: 1.0000 | ORAL_TABLET | Freq: Every evening | ORAL | Status: DC | PRN

## 2021-12-20 MED ORDER — SUGAMMADEX SODIUM 200 MG/2ML IV SOLN
INTRAVENOUS | Status: DC | PRN
  Administered 2021-12-20: 200 mg via INTRAVENOUS

## 2021-12-20 MED ORDER — ONDANSETRON HCL 4 MG/2ML IJ SOLN
INTRAMUSCULAR | Status: DC | PRN
  Administered 2021-12-20: 4 mg via INTRAVENOUS

## 2021-12-20 MED ORDER — LACTATED RINGERS IV SOLN: INTRAVENOUS | Status: DC | PRN

## 2021-12-20 MED ORDER — METOPROLOL TARTRATE 5 MG/5ML IV SOLN
INTRAVENOUS | Status: AC
  Filled 2021-12-20: qty 5

## 2021-12-20 MED ORDER — ACETAMINOPHEN 325 MG PO TABS: 650.0000 mg | ORAL_TABLET | Freq: Four times a day (QID) | ORAL | Status: DC | PRN

## 2021-12-20 MED ORDER — METOPROLOL TARTRATE 5 MG/5ML IV SOLN
5.0000 mg | INTRAVENOUS | Status: DC | PRN
  Administered 2021-12-21: 5 mg via INTRAVENOUS
  Filled 2021-12-20: qty 5

## 2021-12-20 MED ORDER — FENTANYL CITRATE (PF) 100 MCG/2ML IJ SOLN
INTRAMUSCULAR | Status: AC
  Filled 2021-12-20: qty 2

## 2021-12-20 MED ORDER — FENTANYL CITRATE PF 50 MCG/ML IJ SOSY
PREFILLED_SYRINGE | INTRAMUSCULAR | Status: AC
  Filled 2021-12-20: qty 1

## 2021-12-20 MED ORDER — CHLORHEXIDINE GLUCONATE CLOTH 2 % EX PADS
6.0000 | MEDICATED_PAD | Freq: Once | CUTANEOUS | Status: DC
  Administered 2021-12-20: 6 via TOPICAL

## 2021-12-20 MED ORDER — PROPOFOL 10 MG/ML IV BOLUS
INTRAVENOUS | Status: DC | PRN
  Administered 2021-12-20: 130 mg via INTRAVENOUS

## 2021-12-20 MED ORDER — BUPIVACAINE-EPINEPHRINE (PF) 0.25% -1:200000 IJ SOLN
INTRAMUSCULAR | Status: DC | PRN
  Administered 2021-12-20: 30 mL

## 2021-12-20 MED ORDER — DEXTROSE-NACL 5-0.45 % IV SOLN: INTRAVENOUS | Status: DC

## 2021-12-20 MED ORDER — LACTATED RINGERS IR SOLN
Status: DC | PRN
  Administered 2021-12-20: 1000 mL

## 2021-12-20 MED ORDER — BUPIVACAINE-EPINEPHRINE (PF) 0.25% -1:200000 IJ SOLN
INTRAMUSCULAR | Status: AC
  Filled 2021-12-20: qty 30

## 2021-12-20 MED ORDER — FENTANYL CITRATE PF 50 MCG/ML IJ SOSY
25.0000 ug | PREFILLED_SYRINGE | INTRAMUSCULAR | Status: DC | PRN
  Administered 2021-12-20 (×2): 50 ug via INTRAVENOUS

## 2021-12-20 MED ORDER — PHENYLEPHRINE 80 MCG/ML (10ML) SYRINGE FOR IV PUSH (FOR BLOOD PRESSURE SUPPORT)
PREFILLED_SYRINGE | INTRAVENOUS | Status: AC
  Filled 2021-12-20: qty 20

## 2021-12-20 MED ORDER — ROCURONIUM BROMIDE 10 MG/ML (PF) SYRINGE
PREFILLED_SYRINGE | INTRAVENOUS | Status: DC | PRN
  Administered 2021-12-20: 50 mg via INTRAVENOUS

## 2021-12-20 MED ORDER — DIPHENHYDRAMINE HCL 50 MG/ML IJ SOLN
INTRAMUSCULAR | Status: AC
  Filled 2021-12-20: qty 1

## 2021-12-20 MED ORDER — PROPOFOL 10 MG/ML IV BOLUS
INTRAVENOUS | Status: AC
  Filled 2021-12-20: qty 20

## 2021-12-20 MED ORDER — PHENYLEPHRINE HCL-NACL 20-0.9 MG/250ML-% IV SOLN
INTRAVENOUS | Status: DC | PRN
  Administered 2021-12-20: 50 ug/min via INTRAVENOUS

## 2021-12-20 MED ORDER — PHENYLEPHRINE 80 MCG/ML (10ML) SYRINGE FOR IV PUSH (FOR BLOOD PRESSURE SUPPORT)
PREFILLED_SYRINGE | INTRAVENOUS | Status: DC | PRN
  Administered 2021-12-20: 120 ug via INTRAVENOUS
  Administered 2021-12-20 (×4): 160 ug via INTRAVENOUS

## 2021-12-20 MED ORDER — DEXAMETHASONE SODIUM PHOSPHATE 10 MG/ML IJ SOLN
INTRAMUSCULAR | Status: DC | PRN
  Administered 2021-12-20: 5 mg via INTRAVENOUS

## 2021-12-20 SURGICAL SUPPLY — 50 items
ADH SKN CLS APL DERMABOND .7 (GAUZE/BANDAGES/DRESSINGS) ×1
APL PRP STRL LF DISP 70% ISPRP (MISCELLANEOUS) ×1
APPLIER CLIP ROT 10 11.4 M/L (STAPLE) ×2
APR CLP MED LRG 11.4X10 (STAPLE) ×1
BAG COUNTER SPONGE SURGICOUNT (BAG) IMPLANT
BAG RETRIEVAL 10 (BASKET) ×1
BAG SPNG CNTER NS LX DISP (BAG)
CABLE HIGH FREQUENCY MONO STRZ (ELECTRODE) ×2 IMPLANT
CHLORAPREP W/TINT 26 (MISCELLANEOUS) ×3 IMPLANT
CLIP APPLIE ROT 10 11.4 M/L (STAPLE) ×1 IMPLANT
COVER MAYO STAND XLG (MISCELLANEOUS) ×2 IMPLANT
COVER SURGICAL LIGHT HANDLE (MISCELLANEOUS) ×2 IMPLANT
DERMABOND ADVANCED (GAUZE/BANDAGES/DRESSINGS) ×1
DERMABOND ADVANCED .7 DNX12 (GAUZE/BANDAGES/DRESSINGS) IMPLANT
DRAIN CHANNEL 19F RND (DRAIN) ×1 IMPLANT
DRAPE C-ARM 42X120 X-RAY (DRAPES) ×2 IMPLANT
ELECT REM PT RETURN 15FT ADLT (MISCELLANEOUS) ×2 IMPLANT
EVACUATOR SILICONE 100CC (DRAIN) ×1 IMPLANT
GAUZE SPONGE 2X2 8PLY STRL LF (GAUZE/BANDAGES/DRESSINGS) ×1 IMPLANT
GLOVE SURG ORTHO 8.0 STRL STRW (GLOVE) ×2 IMPLANT
GLOVE SURG SYN 7.5  E (GLOVE) ×12
GLOVE SURG SYN 7.5 E (GLOVE) ×6 IMPLANT
GLOVE SURG SYN 7.5 PF PI (GLOVE) ×2 IMPLANT
GOWN STRL REUS W/ TWL XL LVL3 (GOWN DISPOSABLE) ×2 IMPLANT
GOWN STRL REUS W/TWL XL LVL3 (GOWN DISPOSABLE) ×8
HEMOSTAT SNOW SURGICEL 2X4 (HEMOSTASIS) ×1 IMPLANT
HEMOSTAT SURGICEL 4X8 (HEMOSTASIS) IMPLANT
IRRIG SUCT STRYKERFLOW 2 WTIP (MISCELLANEOUS) ×2
IRRIGATION SUCT STRKRFLW 2 WTP (MISCELLANEOUS) ×1 IMPLANT
KIT BASIN OR (CUSTOM PROCEDURE TRAY) ×2 IMPLANT
KIT TURNOVER KIT A (KITS) ×1 IMPLANT
PENCIL SMOKE EVACUATOR (MISCELLANEOUS) IMPLANT
SCISSORS LAP 5X35 DISP (ENDOMECHANICALS) ×2 IMPLANT
SET CHOLANGIOGRAPH MIX (MISCELLANEOUS) ×2 IMPLANT
SET TUBE SMOKE EVAC HIGH FLOW (TUBING) ×1 IMPLANT
SLEEVE Z-THREAD 5X100MM (TROCAR) ×2 IMPLANT
SPIKE FLUID TRANSFER (MISCELLANEOUS) ×1 IMPLANT
SPONGE GAUZE 2X2 STER 10/PKG (GAUZE/BANDAGES/DRESSINGS) ×1
STRIP CLOSURE SKIN 1/2X4 (GAUZE/BANDAGES/DRESSINGS) IMPLANT
SUT ETHILON 2 0 PS N (SUTURE) ×1 IMPLANT
SUT MNCRL AB 4-0 PS2 18 (SUTURE) ×2 IMPLANT
SUT VICRYL 0 UR6 27IN ABS (SUTURE) ×1 IMPLANT
SYS BAG RETRIEVAL 10MM (BASKET) ×1
SYSTEM BAG RETRIEVAL 10MM (BASKET) ×1 IMPLANT
TOWEL OR 17X26 10 PK STRL BLUE (TOWEL DISPOSABLE) ×1 IMPLANT
TOWEL OR NON WOVEN STRL DISP B (DISPOSABLE) ×2 IMPLANT
TRAY LAPAROSCOPIC (CUSTOM PROCEDURE TRAY) ×2 IMPLANT
TROCAR BALLN 12MMX100 BLUNT (TROCAR) ×2 IMPLANT
TROCAR XCEL NON-BLD 11X100MML (ENDOMECHANICALS) ×2 IMPLANT
TROCAR Z-THREAD OPTICAL 5X100M (TROCAR) ×2 IMPLANT

## 2021-12-20 NOTE — Progress Notes (Signed)
PT refused CPAP usage this admission.

## 2021-12-20 NOTE — Anesthesia Preprocedure Evaluation (Addendum)
Anesthesia Evaluation  Patient identified by MRN, date of birth, ID band Patient awake    Reviewed: Allergy & Precautions, NPO status , Patient's Chart, lab work & pertinent test results  Airway Mallampati: II  TM Distance: >3 FB Neck ROM: Full    Dental no notable dental hx.    Pulmonary asthma , sleep apnea ,  COPD inhaler, PE   Pulmonary exam normal        Cardiovascular hypertension, Pt. on medications + DVT  + dysrhythmias (on Xarelto) Atrial Fibrillation  Rhythm:Regular Rate:Normal     Neuro/Psych  Headaches, Depression    GI/Hepatic Neg liver ROS, GERD  Medicated,Acute cholecystitis    Endo/Other  diabetes, Type 2Hyperthyroidism   Renal/GU negative Renal ROS  negative genitourinary   Musculoskeletal  (+) Arthritis , Osteoarthritis,    Abdominal Normal abdominal exam  (+)   Peds  Hematology negative hematology ROS (+) Lab Results      Component                Value               Date                      WBC                      29.5 (H)            12/20/2021                HGB                      12.3                12/20/2021                HCT                      38.1                12/20/2021                MCV                      79.7 (L)            12/20/2021                PLT                      276                 12/20/2021           Lab Results      Component                Value               Date                      NA                       144                 12/20/2021                K  4.5                 12/20/2021                CO2                      20 (L)              12/20/2021                GLUCOSE                  94                  12/20/2021                BUN                      12                  12/20/2021                CREATININE               0.90                12/20/2021                CALCIUM                  8.9                 12/20/2021                 EGFR                     53 (L)              11/21/2020                GFRNONAA                 >60                 12/20/2021             Anesthesia Other Findings   Reproductive/Obstetrics                            Anesthesia Physical Anesthesia Plan  ASA: 3  Anesthesia Plan: General   Post-op Pain Management: Tylenol PO (pre-op)*   Induction: Intravenous  PONV Risk Score and Plan: 3 and Ondansetron, Dexamethasone and Treatment may vary due to age or medical condition  Airway Management Planned: Mask and Oral ETT  Additional Equipment: None  Intra-op Plan:   Post-operative Plan: Extubation in OR  Informed Consent: I have reviewed the patients History and Physical, chart, labs and discussed the procedure including the risks, benefits and alternatives for the proposed anesthesia with the patient or authorized representative who has indicated his/her understanding and acceptance.     Dental advisory given  Plan Discussed with: CRNA  Anesthesia Plan Comments: (ECHO 04/22: 1. Rhythm appears to be atrial flutter with variable conduction. Left  ventricular ejection fraction, by estimation, is 60 to 65%. The left  ventricle has normal function. The left ventricle has no regional wall  motion abnormalities. There is moderate  asymmetric left ventricular hypertrophy of the basal-septal segment. Left  ventricular diastolic parameters are indeterminate.  2. Right ventricular systolic function is normal. The right ventricular  size is normal. There is mildly elevated pulmonary artery systolic  pressure. The estimated right ventricular systolic pressure is 06.9 mmHg.  3. Left atrial size was mildly dilated.  4. Right atrial size was moderately dilated.  5. The mitral valve is normal in structure. Trivial mitral valve  regurgitation. No evidence of mitral stenosis.  6. Tricuspid valve regurgitation is moderate.  7. The aortic valve  is tricuspid. Aortic valve regurgitation is not  visualized. No aortic stenosis is present.  8. The inferior vena cava is normal in size with greater than 50%  respiratory variability, suggesting right atrial pressure of 3 mmHg. )       Anesthesia Quick Evaluation

## 2021-12-20 NOTE — Op Note (Signed)
Procedure Note  Pre-operative Diagnosis:  Acute cholecystitis, cholelithiasis  Post-operative Diagnosis:  same  Surgeon:  Armandina Gemma, MD  Assistant:  Barkley Boards, PA-C   Procedure:  Laparoscopic cholecystectomy with intra-operative cholangiography  Anesthesia:  General  Estimated Blood Loss:  minimal  Drains: 19Fr Blake drain to subhepatic space         Specimen: gallbladder to pathology  Indications:  Patient is a 76 yo female with acute cholecystitis and cholelithiasis.  LFT's have been mildly elevated.  Patient now comes to surgery for cholecystectomy with cholangiography.  Procedure description: The patient was seen in the pre-op holding area. The risks, benefits, complications, treatment options, and expected outcomes were previously discussed with the patient. The patient agreed with the proposed plan and has signed the informed consent form.  The patient was transported to operating room #4 at the [facility]. The patient was placed in the supine position on the operating room table. Following induction of general anesthesia, the abdomen was prepped and draped in the usual aseptic fashion.  An incision was made in the skin near the umbilicus. The midline fascia was incised and the peritoneal cavity was entered and a Hasson cannula was introduced under direct vision. The cannula was secured with a 0-Vicryl pursestring suture. Pneumoperitoneum was established with carbon dioxide. Additional cannulae were introduced under direct vision along the right costal margin in the midline, mid-clavicular line, and anterior axillary line.   The gallbladder was identified after mobilizing the omentum.  The gallbladder was markedly distended, thickened, with some areas of necrosis visible. The fundus was grasped and retracted cephalad. Adhesions were taken down bluntly and the electrocautery was utilized as needed, taking care not to involve any adjacent structures. The infundibulum  was grasped and retracted laterally, exposing the peritoneum overlying the triangle of Calot. The peritoneum was incised and structures exposed with gentle blunt dissection. The cystic duct was clearly identified, bluntly dissected circumferentially, and clipped at the neck of the gallbladder.  An incision was made in the cystic duct and the cholangiogram catheter introduced. The catheter was secured using an ligaclip.  Real-time cholangiography was performed using C-arm fluoroscopy.  There was rapid filling of a normal caliber common bile duct.  There was reflux of contrast into the left and right hepatic ductal systems.  There was free flow distally into the duodenum without filling defect or obstruction.  The catheter was removed from the peritoneal cavity.  The cystic duct was then ligated with ligaclips and divided. The cystic artery was identified, dissected circumferentially, ligated with ligaclips, and divided.  The gallbladder was dissected away from the gallbladder bed using the electrocautery for hemostasis. The gallbladder was completely removed from the liver and placed into an endocatch bag. The gallbladder was removed in the endocatch bag through the umbilical port site and submitted to pathology for review.  The right upper quadrant was irrigated and the gallbladder bed was inspected. A sheet of "snow" was placed in the gallbladder bed.  A 19Fr Blake drain was placed in the subhepatic space and secured on the abdominal wall at the lateral port site with 3-0 Nylon suture.  Cannulae were removed under direct vision and good hemostasis was noted. Pneumoperitoneum was released and the majority of the carbon dioxide evacuated. The umbilical wound was irrigated and the fascia was then closed with the interrupted 0-Vicryl sutures.  Local anesthetic was infiltrated at all port sites. Skin incisions were closed with 4-0 Monocril subcuticular sutures and Dermabond was applied.  Instrument, sponge,  and needle counts were correct at the conclusion of the case.  The patient was awakened from anesthesia and brought to the recovery room in stable condition.  The patient tolerated the procedure well.   Armandina Gemma, MD [facility] Office: [phone]

## 2021-12-20 NOTE — Transfer of Care (Signed)
Immediate Anesthesia Transfer of Care Note  Patient: [name]  Procedure(s) Performed: LAPAROSCOPIC CHOLECYSTECTOMY WITH INTRAOPERATIVE CHOLANGIOGRAM  Patient Location: PACU  Anesthesia Type:General  Level of Consciousness: awake  Airway & Oxygen Therapy: Patient Spontanous Breathing and Patient connected to nasal cannula oxygen  Post-op Assessment: Report given to RN and Post -op Vital signs reviewed and stable  Post vital signs: Reviewed and stable  Last Vitals:  Vitals Value Taken Time  BP 132/83 12/20/21 1331  Temp 36.4 C 12/20/21 1327  Pulse 89 12/20/21 1341  Resp 33 12/20/21 1342  SpO2 95 % 12/20/21 1341  Vitals shown include unvalidated device data.  Last Pain:  Vitals:   12/20/21 1338  TempSrc:   PainSc: 5       Patients Stated Pain Goal: 4 (16/57/90 3833)  Complications: No notable events documented.

## 2021-12-20 NOTE — Interval H&P Note (Signed)
History and Physical Interval Note:  12/20/2021 10:53 AM  [place]  has presented today for surgery, with the diagnosis of Acute cholecystitis.  The various methods of treatment have been discussed with the patient and family. After consideration of risks, benefits and other options for treatment, the patient has consented to    Procedure(s): LAPAROSCOPIC CHOLECYSTECTOMY WITH INTRAOPERATIVE CHOLANGIOGRAM (N/A) as a surgical intervention.    The patient's history has been reviewed, patient examined, no change in status, stable for surgery.  I have reviewed the patient's chart and labs.  Questions were answered to the patient's satisfaction.    Armandina Gemma, [facility] [place] [facility] Office: [place]

## 2021-12-20 NOTE — Discharge Instructions (Signed)
[facility] LAPAROSCOPIC SURGERY: POST OP INSTRUCTIONS Always review your discharge instruction sheet given to you by the facility where your surgery was performed. IF YOU HAVE DISABILITY OR FAMILY LEAVE FORMS, YOU MUST BRING THEM TO THE OFFICE FOR PROCESSING.   DO NOT GIVE THEM TO YOUR DOCTOR.  PAIN CONTROL  First take acetaminophen (Tylenol) AND/or ibuprofen (Advil) to control your pain after surgery.  Follow directions on package.  Taking acetaminophen (Tylenol) and/or ibuprofen (Advil) regularly after surgery will help to control your pain and lower the amount of prescription pain medication you may need.  You should not take more than 3,000 mg (3 grams) of acetaminophen (Tylenol) in 24 hours.  You should not take ibuprofen (Advil), aleve, motrin, naprosyn or other NSAIDS if you have a history of stomach ulcers or chronic kidney disease.  A prescription for pain medication may be given to you upon discharge.  Take your pain medication as prescribed, if you still have uncontrolled pain after taking acetaminophen (Tylenol) or ibuprofen (Advil). Use ice packs to help control pain. If you need a refill on your pain medication, please contact your pharmacy.  They will contact our office to request authorization. Prescriptions will not be filled after 5pm or on week-ends.  HOME MEDICATIONS Take your usually prescribed medications unless otherwise directed.  DIET You should follow a light diet the first few days after arrival home.  Be sure to include lots of fluids daily. Avoid fatty, fried foods.   CONSTIPATION It is common to experience some constipation after surgery and if you are taking pain medication.  Increasing fluid intake and taking a stool softener (such as Colace) will usually help or prevent this problem from occurring.  A mild laxative (Milk of Magnesia or Miralax) should be taken according to package instructions if there are no bowel movements after 48  hours.  WOUND/INCISION CARE Most patients will experience some swelling and bruising in the area of the incisions.  Ice packs will help.  Swelling and bruising can take several days to resolve.  Unless discharge instructions indicate otherwise, follow guidelines below  STERI-STRIPS - you may remove your outer bandages 48 hours after surgery, and you may shower at that time.  You have steri-strips (small skin tapes) in place directly over the incision.  These strips should be left on the skin for 7-10 days.   DERMABOND/SKIN GLUE - you may shower in 24 hours.  The glue will flake off over the next 2-3 weeks. Any sutures or staples will be removed at the office during your follow-up visit.  ACTIVITIES You may resume regular (light) daily activities beginning the next day--such as daily self-care, walking, climbing stairs--gradually increasing activities as tolerated.  You may have sexual intercourse when it is comfortable.  Refrain from any heavy lifting or straining until approved by your doctor. You may drive when you are no longer taking prescription pain medication, you can comfortably wear a seatbelt, and you can safely maneuver your car and apply brakes.  FOLLOW-UP You should see your doctor in the office for a follow-up appointment approximately 2-3 weeks after your surgery.  You should have been given your post-op/follow-up appointment when your surgery was scheduled.  If you did not receive a post-op/follow-up appointment, make sure that you call for this appointment within a day or two after you arrive home to insure a convenient appointment time.   WHEN TO CALL YOUR DOCTOR: Fever over 101.0 Inability to urinate Continued bleeding from incision.   Increased pain, redness, or drainage from the incision. Increasing abdominal pain  The clinic staff is available to answer your questions during regular business hours.  Please don't hesitate to call and ask to speak to one of the nurses for  clinical concerns.  If you have a medical emergency, go to the nearest emergency room or call 911.  A surgeon from [facility] is always on call at the hospital. [address], [address], [place], [place]  [address] ? [address] [address], [place], [place]   [address] [phone] ? [phone] ? FAX [phone] Web site: [email or web address]  

## 2021-12-20 NOTE — Progress Notes (Signed)
PROGRESS NOTE    [name]  GGY:[id number] DOB: 1945/09/07 DOA: 12/18/2021 PCP: [name], MD   Brief Narrative:  76 year old female with history of paroxysmal A-fib on Xarelto, moderate persistent asthma, diabetes mellitus type 2, essential hypertension presented with worsening abdominal pain.  On presentation, WBC was 7500, lipase 23, LFTs normal.  CT abdomen/pelvis with contrast showed multiple gallstones within the gallbladder associated with gallbladder distention, no evidence of common bile duct dilatation or choledocholithiasis.  Right upper quadrant ultrasound showed distended gallbladder with multiple stones and associated gallbladder wall thickening as well as positive sonographic Murphy sign.  She was started on IV antibiotics and fluids.  General surgery was consulted who is planning a laparoscopic cholecystectomy with IOC today.   Assessment & Plan:  Principal Problem:   Acute calculous cholecystitis Active Problems:   Pulmonary hypertension (HCC)   Asthma, moderate persistent   History of pulmonary embolism   Diabetes mellitus with neuropathy (HCC)   Essential hypertension   GERD (gastroesophageal reflux disease)   Paroxysmal atrial fibrillation (HCC)   Mild obstructive sleep apnea   Obstructive sleep apnea treated with continuous positive airway pressure (CPAP)   Chronic anticoagulation - Xerelto   Hyperthyroidism   Leukocytosis     Assessment and Plan:  Sepsis secondary to acute calculus cholecystitis -Sepsis present on admission. -Right upper quadrant ultrasound consistent with acute calculus cholecystitis.  Currently on IV cefepime and Flagyl.  General surgery planning on cholecystectomy today.   Paroxysmal A-fib -Rate controlled.  Xarelto is on hold.  IV Lopressor as needed ordered.  Moderate persistent asthma -Bronchodilators.   Essential hypertension -IV as needed ordered  Hyperthyroidism -On methimazole  Diabetes mellitus type 2 -A1c  5.3.  Home meds on hold.  Sliding scale and Accu-Cheks.   Obesity -Outpatient follow-up    DVT prophylaxis: Xarelto on hold. Code Status: Full code Family Communication:  Called sister Neoma Laming- no answer  Status is: Inpatient Remains inpatient appropriate because: plans for Lap Chole today.    Subjective: Feels ok, seen right before her sx. Has some RUQ pain.    Examination:  General exam: Appears calm and comfortable  Respiratory system: Clear to auscultation. Respiratory effort normal. Cardiovascular system: S1 & S2 heard, RRR. No JVD, murmurs, rubs, gallops or clicks. No pedal edema. Gastrointestinal system: RUQ pain upon palpation.  Central nervous system: Alert and oriented. No focal neurological deficits. Extremities: Symmetric 5 x 5 power. Skin: No rashes, lesions or ulcers Psychiatry: Judgement and insight appear normal. Mood & affect appropriate.     Objective: Vitals:   12/19/21 2200 12/20/21 0500 12/20/21 0619 12/20/21 0741  BP: 128/73  131/66   Pulse: (!) 108  (!) 108   Resp: 19  20   Temp: 98.7 F (37.1 C)  98.2 F (36.8 C)   TempSrc: Oral  Oral   SpO2: 97%  93% 94%  Weight:  85.9 kg    Height:        Intake/Output Summary (Last 24 hours) at 12/20/2021 0845 Last data filed at 12/20/2021 0600 Gross per 24 hour  Intake 1293.24 ml  Output 500 ml  Net 793.24 ml   Filed Weights   12/18/21 1331 12/19/21 0500 12/20/21 0500  Weight: 83.5 kg 86.4 kg 85.9 kg     Data Reviewed:   CBC: Recent Labs  Lab 12/18/21 1425 12/19/21 0526 12/20/21 0442  WBC 7.5 21.5* 29.5*  NEUTROABS  --  17.7* 24.8*  HGB 11.9* 12.0 12.3  HCT 36.1 36.8 38.1  MCV 78.6* 78.5* 79.7*  PLT 221 288 628   Basic Metabolic Panel: Recent Labs  Lab 12/18/21 1425 12/18/21 2018 12/19/21 0526 12/20/21 0442  NA 142  --  139 144  K 3.8  --  4.1 4.5  CL 112*  --  107 113*  CO2 24  --  25 20*  GLUCOSE 121*  --  136* 94  BUN 12  --  12 12  CREATININE 0.84  --  1.00 0.90   CALCIUM 8.1*  --  8.6* 8.9  MG  --  2.1 2.4 2.4   GFR: Estimated Creatinine Clearance: 57.3 mL/min (by C-G formula based on SCr of 0.9 mg/dL). Liver Function Tests: Recent Labs  Lab 12/18/21 1425 12/19/21 0526 12/20/21 0442  AST 22 25 33  ALT '9 13 12  '$ ALKPHOS 74 80 90  BILITOT 0.7 1.6* 2.4*  PROT 6.3* 6.4* 6.4*  ALBUMIN 3.4* 3.5 3.1*   Recent Labs  Lab 12/18/21 1425  LIPASE 23   No results for input(s): AMMONIA in the last 168 hours. Coagulation Profile: Recent Labs  Lab 12/19/21 0526  INR 1.8*   Cardiac Enzymes: No results for input(s): CKTOTAL, CKMB, CKMBINDEX, TROPONINI in the last 168 hours. BNP (last 3 results) No results for input(s): PROBNP in the last 8760 hours. HbA1C: No results for input(s): HGBA1C in the last 72 hours. CBG: Recent Labs  Lab 12/19/21 0607 12/19/21 1206 12/19/21 1815 12/20/21 0044 12/20/21 0624  GLUCAP 127* 132* 139* 99 113*   Lipid Profile: No results for input(s): CHOL, HDL, LDLCALC, TRIG, CHOLHDL, LDLDIRECT in the last 72 hours. Thyroid Function Tests: No results for input(s): TSH, T4TOTAL, FREET4, T3FREE, THYROIDAB in the last 72 hours. Anemia Panel: No results for input(s): VITAMINB12, FOLATE, FERRITIN, TIBC, IRON, RETICCTPCT in the last 72 hours. Sepsis Labs: No results for input(s): PROCALCITON, LATICACIDVEN in the last 168 hours.  Recent Results (from the past 240 hour(s))  WET PREP BY MOLECULAR PROBE     Status: None   Collection Time: 12/12/21  5:02 PM   Specimen: Genital  Result Value Ref Range Status   MICRO NUMBER: [id number]  Final   SPECIMEN QUALITY: Adequate  Final   SOURCE: VAGINA  Final   STATUS: FINAL  Final   Trichomonas vaginosis Not Detected  Final   Gardnerella vaginalis Not Detected  Final   Candida species Not Detected  Final         Radiology Studies: CT ABDOMEN PELVIS W CONTRAST  Result Date: 12/18/2021 CLINICAL DATA:  Abdominal pain, nausea, vomiting EXAM: CT ABDOMEN AND PELVIS WITH  CONTRAST TECHNIQUE: Multidetector CT imaging of the abdomen and pelvis was performed using the standard protocol following bolus administration of intravenous contrast. RADIATION DOSE REDUCTION: This exam was performed according to the departmental dose-optimization program which includes automated exposure control, adjustment of the mA and/or kV according to patient size and/or use of iterative reconstruction technique. CONTRAST:  148m OMNIPAQUE IOHEXOL 300 MG/ML  SOLN COMPARISON:  11/26/2016 FINDINGS: Lower chest: Heart is enlarged in size. No focal infiltrates are seen in the visualized lower lung fields. Hepatobiliary: No focal abnormality is seen in the liver. There are multiple gallbladder stones. Gallbladder is distended. There is no significant wall thickening in gallbladder. Pancreas: No focal abnormality is seen. Spleen: Unremarkable. Adrenals/Urinary Tract: Adrenals are unremarkable. There is no hydronephrosis. There is 2.5 cm cyst in the lower pole of right kidney. There are no renal or ureteral stones. Urinary bladder is distended. Length of the bladder  is proximally 17.6 cm with dome of the bladder at the level of umbilicus. There is no wall thickening in the bladder. Stomach/Bowel: Stomach is unremarkable. Small bowel loops not dilated. Appendix is not distinctly seen. There is no pericecal inflammation. There is no significant wall thickening in the colon. Vascular/Lymphatic: Unremarkable. Reproductive: Uterus is not seen. There are multiple dense calcifications in the right side of pelvis. There is no definite associated soft tissue mass. This finding has not changed significantly. These may suggest calcified lymph nodes or calcifications in the right adnexa. Other: There is no ascites or pneumoperitoneum. Small umbilical hernia containing fat is seen. Musculoskeletal: There is marked sclerosis in the bodies of L4, L5 and S1 vertebrae. Irregularity is seen in the endplates of L4, L5 and S1  vertebrae. There is no definite demonstrable fluid collection in the paraspinal region. Schmorl's node is seen in the lower endplate of body of L1 vertebra. Degenerative changes are noted in the visualized lower thoracic spine. IMPRESSION: There is no evidence intestinal obstruction or pneumoperitoneum. There is no hydronephrosis. Multiple gallbladder stones. Gallbladder is distended. If there are focal symptoms in the right upper quadrant follow-up gallbladder sonogram may be considered. There is interval appearance of marked sclerosis in the L4, L5 and S1 vertebrae along with irregularities in the endplates adjacent to the disc spaces. Possibility of chronic discitis is not excluded. Please correlate with clinical symptoms and consider follow-up MRI. There is marked distention of the urinary bladder with upper margin of dome of the bladder at the level of umbilicus. Other findings as described in the body of the report. Electronically Signed   By: Elmer Picker M.D.   On: 12/18/2021 16:10   US Abdomen Limited RUQ (LIVER/GB)  Result Date: 12/18/2021 CLINICAL DATA:  Abdominal pain EXAM: ULTRASOUND ABDOMEN LIMITED RIGHT UPPER QUADRANT COMPARISON:  Same day CT FINDINGS: Gallbladder: Distended gallbladder containing multiple stones, largest measuring up to 2.2 cm. Small amount of layering biliary sludge within the gallbladder. Gallbladder wall thickening of up to 4.4 mm. A positive sonographic Percell Miller sign was indicated by the technologist. Common bile duct: Diameter: 3 mm. Liver: No focal lesion identified. Within normal limits in parenchymal echogenicity. Portal vein is patent on color Doppler imaging with normal direction of blood flow towards the liver. Other: None. IMPRESSION: Sonographic features suggestive of acute calculus cholecystitis. Electronically Signed   By: Davina Poke D.O.   On: 12/18/2021 17:07        Scheduled Meds:  acetaminophen  500 mg Oral TID WC & HS   insulin aspart  0-6  Units Subcutaneous Q6H   lip balm  1 application. Topical BID   methimazole  5 mg Oral Daily   mometasone-formoterol  2 puff Inhalation BID   montelukast  10 mg Oral QHS   pantoprazole (PROTONIX) IV  40 mg Intravenous Q24H   umeclidinium bromide  1 puff Inhalation Daily   Continuous Infusions:  ceFEPime (MAXIPIME) IV Stopped (12/19/21 2100)   lactated ringers     lactated ringers 75 mL/hr at 12/19/21 2253   methocarbamol (ROBAXIN) IV     metronidazole 500 mg (12/19/21 2122)   ondansetron (ZOFRAN) IV       LOS: 2 days   Time spent= 35 mins    Jatziry Wechter Arsenio Loader, MD [facility]  If 7PM-7AM, please contact night-coverage  12/20/2021, 8:45 AM

## 2021-12-20 NOTE — Anesthesia Procedure Notes (Signed)
Procedure Name: Intubation Date/Time: 12/20/2021 11:57 AM Performed by: West Pugh, CRNA Pre-anesthesia Checklist: Patient identified, Emergency Drugs available, Suction available, Patient being monitored and Timeout performed Patient Re-evaluated:Patient Re-evaluated prior to induction Oxygen Delivery Method: Circle system utilized Preoxygenation: Pre-oxygenation with 100% oxygen Induction Type: IV induction Ventilation: Mask ventilation without difficulty Laryngoscope Size: Mac and 3 Grade View: Grade II Tube type: Oral Tube size: 7.0 mm Number of attempts: 1 Airway Equipment and Method: Stylet Placement Confirmation: ETT inserted through vocal cords under direct vision, positive ETCO2, CO2 detector and breath sounds checked- equal and bilateral Secured at: 22 cm Tube secured with: Tape Dental Injury: Teeth and Oropharynx as per pre-operative assessment

## 2021-12-20 NOTE — Progress Notes (Signed)
Assessment & Plan: Acute calculous cholecystitis  Plan to proceed with lap chole with IOC, possible open cholecystectomy this morning  Discussed with patient and daughter, [name]  The risks and benefits of the procedure have been discussed at length with the patient.  The patient understands the proposed procedure, potential alternative treatments, and the course of recovery to be expected.  All of the patient's questions have been answered at this time.  The patient wishes to proceed with surgery.        [name], [facility] [place] practice Office: [phone]        Chief Complaint: Acute cholecystitis, cholelithiasis  Subjective: Patient with less pain this AM  Objective: Vital signs in last 24 hours: Temp:  [98.2 F (36.8 C)-99.4 F (37.4 C)] 98.2 F (36.8 C) (06/07 0619) Pulse Rate:  [98-109] 108 (06/07 0619) Resp:  [19-20] 20 (06/07 0619) BP: (128-140)/(66-79) 131/66 (06/07 0619) SpO2:  [93 %-100 %] 94 % (06/07 0741) Weight:  [85.9 kg] 85.9 kg (06/07 0500)    Intake/Output from previous day: 06/06 0701 - 06/07 0700 In: 1293.2 [P.O.:80; I.V.:941; IV Piggyback:272.2] Out: 1100 [Urine:1100] Intake/Output this shift: No intake/output data recorded.  Physical Exam: HEENT - sclerae clear, mucous membranes moist Neck - soft Abdomen - soft, less distended; tender to palpation RUQ Neuro - alert & oriented, no focal deficits  Lab Results:  Recent Labs    12/19/21 0526 12/20/21 0442  WBC 21.5* 29.5*  HGB 12.0 12.3  HCT 36.8 38.1  PLT 288 276   BMET Recent Labs    12/19/21 0526 12/20/21 0442  NA 139 144  K 4.1 4.5  CL 107 113*  CO2 25 20*  GLUCOSE 136* 94  BUN 12 12  CREATININE 1.00 0.90  CALCIUM 8.6* 8.9   PT/INR Recent Labs    12/19/21 0526  LABPROT 20.4*  INR 1.8*   Comprehensive Metabolic Panel:    Component Value Date/Time   NA 144 12/20/2021 0442   NA 139 12/19/2021 0526   NA 140 11/21/2020 1026    NA 141 10/26/2020 1028   K 4.5 12/20/2021 0442   K 4.1 12/19/2021 0526   CL 113 (H) 12/20/2021 0442   CL 107 12/19/2021 0526   CO2 20 (L) 12/20/2021 0442   CO2 25 12/19/2021 0526   BUN 12 12/20/2021 0442   BUN 12 12/19/2021 0526   BUN 16 11/21/2020 1026   BUN 13 10/26/2020 1028   CREATININE 0.90 12/20/2021 0442   CREATININE 1.00 12/19/2021 0526   CREATININE 1.01 (H) 12/30/2020 1635   CREATININE 1.09 (H) 10/14/2020 1521   GLUCOSE 94 12/20/2021 0442   GLUCOSE 136 (H) 12/19/2021 0526   CALCIUM 8.9 12/20/2021 0442   CALCIUM 8.6 (L) 12/19/2021 0526   AST 33 12/20/2021 0442   AST 25 12/19/2021 0526   ALT 12 12/20/2021 0442   ALT 13 12/19/2021 0526   ALKPHOS 90 12/20/2021 0442   ALKPHOS 80 12/19/2021 0526   BILITOT 2.4 (H) 12/20/2021 0442   BILITOT 1.6 (H) 12/19/2021 0526   PROT 6.4 (L) 12/20/2021 0442   PROT 6.4 (L) 12/19/2021 0526   ALBUMIN 3.1 (L) 12/20/2021 0442   ALBUMIN 3.5 12/19/2021 0526    Studies/Results: CT ABDOMEN PELVIS W CONTRAST  Result Date: 12/18/2021 CLINICAL DATA:  Abdominal pain, nausea, vomiting EXAM: CT ABDOMEN AND PELVIS WITH CONTRAST TECHNIQUE: Multidetector CT imaging of the abdomen and pelvis was performed using the standard protocol following bolus administration of  intravenous contrast. RADIATION DOSE REDUCTION: This exam was performed according to the departmental dose-optimization program which includes automated exposure control, adjustment of the mA and/or kV according to patient size and/or use of iterative reconstruction technique. CONTRAST:  131m OMNIPAQUE IOHEXOL 300 MG/ML  SOLN COMPARISON:  11/26/2016 FINDINGS: Lower chest: Heart is enlarged in size. No focal infiltrates are seen in the visualized lower lung fields. Hepatobiliary: No focal abnormality is seen in the liver. There are multiple gallbladder stones. Gallbladder is distended. There is no significant wall thickening in gallbladder. Pancreas: No focal abnormality is seen. Spleen:  Unremarkable. Adrenals/Urinary Tract: Adrenals are unremarkable. There is no hydronephrosis. There is 2.5 cm cyst in the lower pole of right kidney. There are no renal or ureteral stones. Urinary bladder is distended. Length of the bladder is proximally 17.6 cm with dome of the bladder at the level of umbilicus. There is no wall thickening in the bladder. Stomach/Bowel: Stomach is unremarkable. Small bowel loops not dilated. Appendix is not distinctly seen. There is no pericecal inflammation. There is no significant wall thickening in the colon. Vascular/Lymphatic: Unremarkable. Reproductive: Uterus is not seen. There are multiple dense calcifications in the right side of pelvis. There is no definite associated soft tissue mass. This finding has not changed significantly. These may suggest calcified lymph nodes or calcifications in the right adnexa. Other: There is no ascites or pneumoperitoneum. Small umbilical hernia containing fat is seen. Musculoskeletal: There is marked sclerosis in the bodies of L4, L5 and S1 vertebrae. Irregularity is seen in the endplates of L4, L5 and S1 vertebrae. There is no definite demonstrable fluid collection in the paraspinal region. Schmorl's node is seen in the lower endplate of body of L1 vertebra. Degenerative changes are noted in the visualized lower thoracic spine. IMPRESSION: There is no evidence intestinal obstruction or pneumoperitoneum. There is no hydronephrosis. Multiple gallbladder stones. Gallbladder is distended. If there are focal symptoms in the right upper quadrant follow-up gallbladder sonogram may be considered. There is interval appearance of marked sclerosis in the L4, L5 and S1 vertebrae along with irregularities in the endplates adjacent to the disc spaces. Possibility of chronic discitis is not excluded. Please correlate with clinical symptoms and consider follow-up MRI. There is marked distention of the urinary bladder with upper margin of dome of the  bladder at the level of umbilicus. Other findings as described in the body of the report. Electronically Signed   By: PElmer PickerM.D.   On: 12/18/2021 16:10   [place]Abdomen Limited RUQ (LIVER/GB)  Result Date: 12/18/2021 CLINICAL DATA:  Abdominal pain EXAM: ULTRASOUND ABDOMEN LIMITED RIGHT UPPER QUADRANT COMPARISON:  Same day CT FINDINGS: Gallbladder: Distended gallbladder containing multiple stones, largest measuring up to 2.2 cm. Small amount of layering biliary sludge within the gallbladder. Gallbladder wall thickening of up to 4.4 mm. A positive sonographic MPercell Millersign was indicated by the technologist. Common bile duct: Diameter: 3 mm. Liver: No focal lesion identified. Within normal limits in parenchymal echogenicity. Portal vein is patent on color Doppler imaging with normal direction of blood flow towards the liver. Other: None. IMPRESSION: Sonographic features suggestive of acute calculus cholecystitis. Electronically Signed   By: NDavina PokeD.O.   On: 12/18/2021 17:07      TArmandina Gemma6/01/2022   Patient ID: [name] female   DOB: 109/20/1947 76y.o.   MRN: [id number]

## 2021-12-20 NOTE — H&P (View-Only) (Signed)
Assessment & Plan: Acute calculous cholecystitis  Plan to proceed with lap chole with IOC, possible open cholecystectomy this morning  Discussed with patient and daughter, [name]  The risks and benefits of the procedure have been discussed at length with the patient.  The patient understands the proposed procedure, potential alternative treatments, and the course of recovery to be expected.  All of the patient's questions have been answered at this time.  The patient wishes to proceed with surgery.        [name], [facility] [place] [facility] Office: [phone]        Chief Complaint: Acute cholecystitis, cholelithiasis  Subjective: Patient with less pain this AM  Objective: Vital signs in last 24 hours: Temp:  [98.2 F (36.8 C)-99.4 F (37.4 C)] 98.2 F (36.8 C) (06/07 0619) Pulse Rate:  [98-109] 108 (06/07 0619) Resp:  [19-20] 20 (06/07 0619) BP: (128-140)/(66-79) 131/66 (06/07 0619) SpO2:  [93 %-100 %] 94 % (06/07 0741) Weight:  [85.9 kg] 85.9 kg (06/07 0500)    Intake/Output from previous day: 06/06 0701 - 06/07 0700 In: 1293.2 [P.O.:80; I.V.:941; IV Piggyback:272.2] Out: 1100 [Urine:1100] Intake/Output this shift: No intake/output data recorded.  Physical Exam: HEENT - sclerae clear, mucous membranes moist Neck - soft Abdomen - soft, less distended; tender to palpation RUQ Neuro - alert & oriented, no focal deficits  Lab Results:  Recent Labs    12/19/21 0526 12/20/21 0442  WBC 21.5* 29.5*  HGB 12.0 12.3  HCT 36.8 38.1  PLT 288 276   BMET Recent Labs    12/19/21 0526 12/20/21 0442  NA 139 144  K 4.1 4.5  CL 107 113*  CO2 25 20*  GLUCOSE 136* 94  BUN 12 12  CREATININE 1.00 0.90  CALCIUM 8.6* 8.9   PT/INR Recent Labs    12/19/21 0526  LABPROT 20.4*  INR 1.8*   Comprehensive Metabolic Panel:    Component Value Date/Time   NA 144 12/20/2021 0442   NA 139 12/19/2021 0526   NA 140 11/21/2020 1026    NA 141 10/26/2020 1028   K 4.5 12/20/2021 0442   K 4.1 12/19/2021 0526   CL 113 (H) 12/20/2021 0442   CL 107 12/19/2021 0526   CO2 20 (L) 12/20/2021 0442   CO2 25 12/19/2021 0526   BUN 12 12/20/2021 0442   BUN 12 12/19/2021 0526   BUN 16 11/21/2020 1026   BUN 13 10/26/2020 1028   CREATININE 0.90 12/20/2021 0442   CREATININE 1.00 12/19/2021 0526   CREATININE 1.01 (H) 12/30/2020 1635   CREATININE 1.09 (H) 10/14/2020 1521   GLUCOSE 94 12/20/2021 0442   GLUCOSE 136 (H) 12/19/2021 0526   CALCIUM 8.9 12/20/2021 0442   CALCIUM 8.6 (L) 12/19/2021 0526   AST 33 12/20/2021 0442   AST 25 12/19/2021 0526   ALT 12 12/20/2021 0442   ALT 13 12/19/2021 0526   ALKPHOS 90 12/20/2021 0442   ALKPHOS 80 12/19/2021 0526   BILITOT 2.4 (H) 12/20/2021 0442   BILITOT 1.6 (H) 12/19/2021 0526   PROT 6.4 (L) 12/20/2021 0442   PROT 6.4 (L) 12/19/2021 0526   ALBUMIN 3.1 (L) 12/20/2021 0442   ALBUMIN 3.5 12/19/2021 0526    Studies/Results: CT ABDOMEN PELVIS W CONTRAST  Result Date: 12/18/2021 CLINICAL DATA:  Abdominal pain, nausea, vomiting EXAM: CT ABDOMEN AND PELVIS WITH CONTRAST TECHNIQUE: Multidetector CT imaging of the abdomen and pelvis was performed using the standard protocol following bolus administration of  intravenous contrast. RADIATION DOSE REDUCTION: This exam was performed according to the departmental dose-optimization program which includes automated exposure control, adjustment of the mA and/or kV according to patient size and/or use of iterative reconstruction technique. CONTRAST:  118m OMNIPAQUE IOHEXOL 300 MG/ML  SOLN COMPARISON:  11/26/2016 FINDINGS: Lower chest: Heart is enlarged in size. No focal infiltrates are seen in the visualized lower lung fields. Hepatobiliary: No focal abnormality is seen in the liver. There are multiple gallbladder stones. Gallbladder is distended. There is no significant wall thickening in gallbladder. Pancreas: No focal abnormality is seen. Spleen:  Unremarkable. Adrenals/Urinary Tract: Adrenals are unremarkable. There is no hydronephrosis. There is 2.5 cm cyst in the lower pole of right kidney. There are no renal or ureteral stones. Urinary bladder is distended. Length of the bladder is proximally 17.6 cm with dome of the bladder at the level of umbilicus. There is no wall thickening in the bladder. Stomach/Bowel: Stomach is unremarkable. Small bowel loops not dilated. Appendix is not distinctly seen. There is no pericecal inflammation. There is no significant wall thickening in the colon. Vascular/Lymphatic: Unremarkable. Reproductive: Uterus is not seen. There are multiple dense calcifications in the right side of pelvis. There is no definite associated soft tissue mass. This finding has not changed significantly. These may suggest calcified lymph nodes or calcifications in the right adnexa. Other: There is no ascites or pneumoperitoneum. Small umbilical hernia containing fat is seen. Musculoskeletal: There is marked sclerosis in the bodies of L4, L5 and S1 vertebrae. Irregularity is seen in the endplates of L4, L5 and S1 vertebrae. There is no definite demonstrable fluid collection in the paraspinal region. Schmorl's node is seen in the lower endplate of body of L1 vertebra. Degenerative changes are noted in the visualized lower thoracic spine. IMPRESSION: There is no evidence intestinal obstruction or pneumoperitoneum. There is no hydronephrosis. Multiple gallbladder stones. Gallbladder is distended. If there are focal symptoms in the right upper quadrant follow-up gallbladder sonogram may be considered. There is interval appearance of marked sclerosis in the L4, L5 and S1 vertebrae along with irregularities in the endplates adjacent to the disc spaces. Possibility of chronic discitis is not excluded. Please correlate with clinical symptoms and consider follow-up MRI. There is marked distention of the urinary bladder with upper margin of dome of the  bladder at the level of umbilicus. Other findings as described in the body of the report. Electronically Signed   By: PElmer PickerM.D.   On: 12/18/2021 16:10   UKoreaAbdomen Limited RUQ (LIVER/GB)  Result Date: 12/18/2021 CLINICAL DATA:  Abdominal pain EXAM: ULTRASOUND ABDOMEN LIMITED RIGHT UPPER QUADRANT COMPARISON:  Same day CT FINDINGS: Gallbladder: Distended gallbladder containing multiple stones, largest measuring up to 2.2 cm. Small amount of layering biliary sludge within the gallbladder. Gallbladder wall thickening of up to 4.4 mm. A positive sonographic MPercell Millersign was indicated by the technologist. Common bile duct: Diameter: 3 mm. Liver: No focal lesion identified. Within normal limits in parenchymal echogenicity. Portal vein is patent on color Doppler imaging with normal direction of blood flow towards the liver. Other: None. IMPRESSION: Sonographic features suggestive of acute calculus cholecystitis. Electronically Signed   By: NDavina PokeD.O.   On: 12/18/2021 17:07      TArmandina Gemma6/01/2022   Patient ID: [name] female   DOB: [birth year] 76y.o.   MRN: [id number]

## 2021-12-21 ENCOUNTER — Encounter (HOSPITAL_COMMUNITY): Payer: Self-pay | Admitting: Surgery

## 2021-12-21 DIAGNOSIS — K8000 Calculus of gallbladder with acute cholecystitis without obstruction: Secondary | ICD-10-CM | POA: Diagnosis not present

## 2021-12-21 LAB — BASIC METABOLIC PANEL
Anion gap: 7 (ref 5–15)
BUN: 19 mg/dL (ref 8–23)
CO2: 23 mmol/L (ref 22–32)
Calcium: 8.8 mg/dL — ABNORMAL LOW (ref 8.9–10.3)
Chloride: 109 mmol/L (ref 98–111)
Creatinine, Ser: 0.98 mg/dL (ref 0.44–1.00)
GFR, Estimated: >60 mL/min (ref >60)
Glucose, Bld: 203 mg/dL — ABNORMAL HIGH (ref 70–99)
Potassium: 4.6 mmol/L (ref 3.5–5.1)
Sodium: 139 mmol/L (ref 135–145)

## 2021-12-21 LAB — CBC
HCT: 34.4 % — ABNORMAL LOW (ref 36.0–46.0)
Hemoglobin: 11.1 g/dL — ABNORMAL LOW (ref 12.0–15.0)
MCH: 25.7 pg — ABNORMAL LOW (ref 26.0–34.0)
MCHC: 32.3 g/dL (ref 30.0–36.0)
MCV: 79.6 fL — ABNORMAL LOW (ref 80.0–100.0)
Platelets: 265 10*3/uL (ref 150–400)
RBC: 4.32 MIL/uL (ref 3.87–5.11)
RDW: 16.9 % — ABNORMAL HIGH (ref 11.5–15.5)
WBC: 23.5 10*3/uL — ABNORMAL HIGH (ref 4.0–10.5)
nRBC: 0 % (ref 0.0–0.2)

## 2021-12-21 LAB — GLUCOSE, CAPILLARY
Glucose-Capillary: 175 mg/dL — ABNORMAL HIGH (ref 70–99)
Glucose-Capillary: 213 mg/dL — ABNORMAL HIGH (ref 70–99)
Glucose-Capillary: 158 mg/dL — ABNORMAL HIGH (ref 70–99)

## 2021-12-21 LAB — SURGICAL PATHOLOGY

## 2021-12-21 LAB — MAGNESIUM: Magnesium: 2.4 mg/dL (ref 1.7–2.4)

## 2021-12-21 MED ORDER — POLYVINYL ALCOHOL 1.4 % OP SOLN
1.0000 [drp] | OPHTHALMIC | Status: DC | PRN
  Administered 2021-12-21 – 2021-12-22 (×2): 1 [drp] via OPHTHALMIC
  Filled 2021-12-21: qty 15

## 2021-12-21 MED ORDER — ARTIFICIAL TEARS OPHTHALMIC OINT
TOPICAL_OINTMENT | OPHTHALMIC | Status: DC | PRN
  Administered 2021-12-21: 1 via OPHTHALMIC
  Filled 2021-12-21 (×2): qty 3.5

## 2021-12-21 NOTE — Progress Notes (Signed)
Progress Note  1 Day Post-Op  Subjective: Pt reports mild upper abdominal pain with walking but overall doing well. No flatus or BM yet but has been getting up to urinate. Denies nausea or vomiting and tolerated some solid food for breakfast this AM. Reports some itching but no rash noted. Also reports dry eyes.   Objective: Vital signs in last 24 hours: Temp:  [97.6 F (36.4 C)-97.8 F (36.6 C)] 97.7 F (36.5 C) (06/08 0516) Pulse Rate:  [86-102] 87 (06/08 0516) Resp:  [16-29] 22 (06/08 0855) BP: (104-143)/(64-83) 104/66 (06/08 0516) SpO2:  [93 %-100 %] 96 % (06/08 0855) Weight:  [83 kg] 83 kg (06/08 0516) Last BM Date : 12/17/21  Intake/Output from previous day: 06/07 0701 - 06/08 0700 In: 3880.3 [P.O.:720; I.V.:1914.3; IV Piggyback:1216] Out: 670 [Urine:500; Drains:170] Intake/Output this shift: Total I/O In: 100 [IV Piggyback:100] Out: 300 [Urine:300]  PE: General: pleasant, WD, elderly female who is sitting up, NAD HEENT: sclera anicteric and not injected Heart: regular, rate, and rhythm. Palpable radial and pedal pulses bilaterally Lungs:  Respiratory effort nonlabored Abd: soft, appropriately ttp, ND, +BS, drain with bloody fluid, incisions C/D/I Skin: no rash noted  Psych: A&Ox3 with an appropriate affect.    Lab Results:  Recent Labs    12/20/21 0442 12/21/21 0512  WBC 29.5* 23.5*  HGB 12.3 11.1*  HCT 38.1 34.4*  PLT 276 265   BMET Recent Labs    12/20/21 0442 12/21/21 0512  NA 144 139  K 4.5 4.6  CL 113* 109  CO2 20* 23  GLUCOSE 94 203*  BUN 12 19  CREATININE 0.90 0.98  CALCIUM 8.9 8.8*   PT/INR Recent Labs    12/19/21 0526  LABPROT 20.4*  INR 1.8*   CMP     Component Value Date/Time   NA 139 12/21/2021 0512   NA 140 11/21/2020 1026   K 4.6 12/21/2021 0512   CL 109 12/21/2021 0512   CO2 23 12/21/2021 0512   GLUCOSE 203 (H) 12/21/2021 0512   BUN 19 12/21/2021 0512   BUN 16 11/21/2020 1026   CREATININE 0.98 12/21/2021 0512    CREATININE 1.01 (H) 12/30/2020 1635   CALCIUM 8.8 (L) 12/21/2021 0512   PROT 6.4 (L) 12/20/2021 0442   ALBUMIN 3.1 (L) 12/20/2021 0442   AST 33 12/20/2021 0442   ALT 12 12/20/2021 0442   ALKPHOS 90 12/20/2021 0442   BILITOT 2.4 (H) 12/20/2021 0442   GFRNONAA >60 12/21/2021 0512   GFRNONAA 53 (L) 01/04/2020 1059   GFRAA 62 01/04/2020 1059   Lipase     Component Value Date/Time   LIPASE 23 12/18/2021 1425       Studies/Results: DG Cholangiogram Operative  Result Date: 12/20/2021 CLINICAL DATA:  Cholecystectomy for cholelithiasis and acute cholecystitis. EXAM: INTRAOPERATIVE CHOLANGIOGRAM TECHNIQUE: Cholangiographic images from the C-arm fluoroscopic device were submitted for interpretation post-operatively. Please see the procedural report for the amount of contrast and the fluoroscopy time utilized. FLUOROSCOPY: Radiation Exposure Index (as provided by the fluoroscopic device): 7.3 mGy Kerma COMPARISON:  Right upper quadrant ultrasound 12/18/2021 FINDINGS: Intraoperative imaging with a C-arm demonstrates normal opacified cystic duct stump, common bile duct and visualized intrahepatic ducts. No evidence of biliary dilatation, stricture, filling defect or contrast extravasation. Contrast enters the duodenum normally. IMPRESSION: Normal intraoperative cholangiogram. Electronically Signed   By: Aletta Edouard M.D.   On: 12/20/2021 12:41    Anti-infectives: Anti-infectives (From admission, onward)    Start     Dose/Rate Route  Frequency Ordered Stop   12/19/21 2000  ceFEPIme (MAXIPIME) 2 g in sodium chloride 0.9 % 100 mL IVPB  Status:  Discontinued        2 g 200 mL/hr over 30 Minutes Intravenous Every 12 hours 12/19/21 1248 12/21/21 0844   12/18/21 2000  ciprofloxacin (CIPRO) IVPB 400 mg  Status:  Discontinued        400 mg 200 mL/hr over 60 Minutes Intravenous Every 12 hours 12/18/21 1901 12/19/21 1240   12/18/21 2000  metroNIDAZOLE (FLAGYL) IVPB 500 mg  Status:  Discontinued         500 mg 100 mL/hr over 60 Minutes Intravenous Every 12 hours 12/18/21 1901 12/21/21 0844        Assessment/Plan Acute gangrenous cholecystitis  POD1 S/P laparoscopic cholecystectomy Dr. Harlow Asa - pt mobilizing well with mild pain as expected - drain with 170 cc out, bloody - continue today and monitor, hopefully remove prior to DC, hold off on resuming xarelto until tomorrow if hgb stable  - diet as tolerated - no further abx needed  - likely DC home tomorrow if continuing to recover well and medically stable   FEN: HH/CM diet  VTE: SCDs, continue to hold Xarelto today but likely resume tomorrow if hgb stable  ID: abx stopped today   - Per TRH - Moderate persistent asthma  Afib on xarelto   DM2  HTN  LOS: 3 days      Norm Parcel, [facility] 12/21/2021, 11:55 AM Please see Amion for pager number during day hours 7:00am-4:30pm

## 2021-12-21 NOTE — Progress Notes (Signed)
PROGRESS NOTE    [name]  SNK:[id number] DOB: 06/20/1946 DOA: 12/18/2021 PCP: [name], MD   Brief Narrative:  76 year old female with history of paroxysmal A-fib on Xarelto, moderate persistent asthma, diabetes mellitus type 2, essential hypertension presented with worsening abdominal pain.  On presentation, WBC was 7500, lipase 23, LFTs normal.  CT abdomen/pelvis with contrast showed multiple gallstones within the gallbladder associated with gallbladder distention, no evidence of common bile duct dilatation or choledocholithiasis.  Right upper quadrant ultrasound showed distended gallbladder with multiple stones and associated gallbladder wall thickening as well as positive sonographic Murphy sign.  She was started on IV antibiotics and fluids.  General surgery was consulted who is planning a laparoscopic cholecystectomy with IOC 6/7.   Assessment & Plan:  Principal Problem:   Acute calculous cholecystitis Active Problems:   Pulmonary hypertension (HCC)   Asthma, moderate persistent   History of pulmonary embolism   Diabetes mellitus with neuropathy (HCC)   Essential hypertension   GERD (gastroesophageal reflux disease)   Paroxysmal atrial fibrillation (HCC)   Mild obstructive sleep apnea   Obstructive sleep apnea treated with continuous positive airway pressure (CPAP)   Chronic anticoagulation - Xerelto   Hyperthyroidism   Leukocytosis     Assessment and Plan:  Sepsis secondary to acute calculus cholecystitis status post laparoscopic cholecystectomy on 6/7 -Sepsis present on admission. -Right upper quadrant ultrasound consistent with acute calculus cholecystitis.  Continue on IV antibiotics, will defer need for antibiotics to general surgery.  Advance diet as tolerated.  Ambulate as much as possible.   Paroxysmal A-fib -Rate controlled.  Tentatively planning to start Xarelto tomorrow  Moderate persistent asthma -Bronchodilators.   Essential hypertension -IV  as needed ordered  Hyperthyroidism -On methimazole  Diabetes mellitus type 2 -A1c 5.3.  Home meds on hold.  Sliding scale and Accu-Cheks.   Obesity -Outpatient follow-up    DVT prophylaxis: Xarelto on hold. Code Status: Full code Family Communication:  Called sister Neoma Laming- no answer  Status is: Inpatient Remains inpatient appropriate because: Awaiting bowel function return and general surgery clearance.  Hopefully discharge in next 24 hours  Subjective: Patient feels okay, she has not had bowel movement yet or passed gas.  Denies any nausea vomiting.  Remains afebrile.  Examination:  Constitutional: Not in acute distress Respiratory: Clear to auscultation bilaterally Cardiovascular: Normal sinus rhythm, no rubs Abdomen: Nontender nondistended good bowel sounds Musculoskeletal: No edema noted Skin: No rashes seen Neurologic: CN 2-12 grossly intact.  And nonfocal Psychiatric: Normal judgment and insight. Alert and oriented x 3. Normal mood.  Objective: Vitals:   12/20/21 1924 12/20/21 2023 12/21/21 0516 12/21/21 0855  BP:  130/64 104/66   Pulse:  (!) 102 87   Resp:  16 18 (!) 22  Temp:  97.8 F (36.6 C) 97.7 F (36.5 C)   TempSrc:  Oral Oral   SpO2: 93% 98% 99% 96%  Weight:   83 kg   Height:        Intake/Output Summary (Last 24 hours) at 12/21/2021 1156 Last data filed at 12/21/2021 1100 Gross per 24 hour  Intake 3980.28 ml  Output 970 ml  Net 3010.28 ml   Filed Weights   12/20/21 0500 12/20/21 1022 12/21/21 0516  Weight: 85.9 kg 85.9 kg 83 kg     Data Reviewed:   CBC: Recent Labs  Lab 12/18/21 1425 12/19/21 0526 12/20/21 0442 12/21/21 0512  WBC 7.5 21.5* 29.5* 23.5*  NEUTROABS  --  17.7* 24.8*  --  HGB 11.9* 12.0 12.3 11.1*  HCT 36.1 36.8 38.1 34.4*  MCV 78.6* 78.5* 79.7* 79.6*  PLT 221 288 276 425   Basic Metabolic Panel: Recent Labs  Lab 12/18/21 1425 12/18/21 2018 12/19/21 0526 12/20/21 0442 12/21/21 0512  NA 142  --  139 144 139   K 3.8  --  4.1 4.5 4.6  CL 112*  --  107 113* 109  CO2 24  --  25 20* 23  GLUCOSE 121*  --  136* 94 203*  BUN 12  --  '12 12 19  '$ CREATININE 0.84  --  1.00 0.90 0.98  CALCIUM 8.1*  --  8.6* 8.9 8.8*  MG  --  2.1 2.4 2.4 2.4   GFR: Estimated Creatinine Clearance: 51.7 mL/min (by C-G formula based on SCr of 0.98 mg/dL). Liver Function Tests: Recent Labs  Lab 12/18/21 1425 12/19/21 0526 12/20/21 0442  AST 22 25 33  ALT '9 13 12  '$ ALKPHOS 74 80 90  BILITOT 0.7 1.6* 2.4*  PROT 6.3* 6.4* 6.4*  ALBUMIN 3.4* 3.5 3.1*   Recent Labs  Lab 12/18/21 1425  LIPASE 23   No results for input(s): "AMMONIA" in the last 168 hours. Coagulation Profile: Recent Labs  Lab 12/19/21 0526  INR 1.8*   Cardiac Enzymes: No results for input(s): "CKTOTAL", "CKMB", "CKMBINDEX", "TROPONINI" in the last 168 hours. BNP (last 3 results) No results for input(s): "PROBNP" in the last 8760 hours. HbA1C: No results for input(s): "HGBA1C" in the last 72 hours. CBG: Recent Labs  Lab 12/20/21 1019 12/20/21 1626 12/20/21 2352 12/21/21 0558 12/21/21 1112  GLUCAP 109* 158* 257* 175* 213*   Lipid Profile: No results for input(s): "CHOL", "HDL", "LDLCALC", "TRIG", "CHOLHDL", "LDLDIRECT" in the last 72 hours. Thyroid Function Tests: No results for input(s): "TSH", "T4TOTAL", "FREET4", "T3FREE", "THYROIDAB" in the last 72 hours. Anemia Panel: No results for input(s): "VITAMINB12", "FOLATE", "FERRITIN", "TIBC", "IRON", "RETICCTPCT" in the last 72 hours. Sepsis Labs: No results for input(s): "PROCALCITON", "LATICACIDVEN" in the last 168 hours.  Recent Results (from the past 240 hour(s))  WET PREP BY MOLECULAR PROBE     Status: None   Collection Time: 12/12/21  5:02 PM   Specimen: Genital  Result Value Ref Range Status   MICRO NUMBER: [id number]  Final   SPECIMEN QUALITY: Adequate  Final   SOURCE: VAGINA  Final   STATUS: FINAL  Final   Trichomonas vaginosis Not Detected  Final   Gardnerella vaginalis  Not Detected  Final   Candida species Not Detected  Final         Radiology Studies: DG Cholangiogram Operative  Result Date: 12/20/2021 CLINICAL DATA:  Cholecystectomy for cholelithiasis and acute cholecystitis. EXAM: INTRAOPERATIVE CHOLANGIOGRAM TECHNIQUE: Cholangiographic images from the C-arm fluoroscopic device were submitted for interpretation post-operatively. Please see the procedural report for the amount of contrast and the fluoroscopy time utilized. FLUOROSCOPY: Radiation Exposure Index (as provided by the fluoroscopic device): 7.3 mGy Kerma COMPARISON:  Right upper quadrant ultrasound 12/18/2021 FINDINGS: Intraoperative imaging with a C-arm demonstrates normal opacified cystic duct stump, common bile duct and visualized intrahepatic ducts. No evidence of biliary dilatation, stricture, filling defect or contrast extravasation. Contrast enters the duodenum normally. IMPRESSION: Normal intraoperative cholangiogram. Electronically Signed   By: Aletta Edouard M.D.   On: 12/20/2021 12:41        Scheduled Meds:  acetaminophen  650 mg Oral Q6H   insulin aspart  0-6 Units Subcutaneous Q6H   lip balm  1 application. Topical  BID   methimazole  5 mg Oral Daily   mometasone-formoterol  2 puff Inhalation BID   montelukast  10 mg Oral QHS   pantoprazole (PROTONIX) IV  40 mg Intravenous Q24H   umeclidinium bromide  1 puff Inhalation Daily   Continuous Infusions:  ondansetron (ZOFRAN) IV       LOS: 3 days   Time spent= 35 mins    Shandrika Ambers Arsenio Loader, MD [facility]  If 7PM-7AM, please contact night-coverage  12/21/2021, 11:56 AM

## 2021-12-21 NOTE — Care Management Important Message (Signed)
Important Message  Patient Details IM Letter given to the Patient. Name: [name] MRN: [id number] Date of Birth: [birth year]   Medicare Important Message Given:  Yes     Kerin Salen 12/21/2021, 1:19 PM

## 2021-12-22 DIAGNOSIS — K8000 Calculus of gallbladder with acute cholecystitis without obstruction: Secondary | ICD-10-CM | POA: Diagnosis not present

## 2021-12-22 LAB — BASIC METABOLIC PANEL
Anion gap: 8 (ref 5–15)
BUN: 20 mg/dL (ref 8–23)
CO2: 23 mmol/L (ref 22–32)
Calcium: 8.8 mg/dL — ABNORMAL LOW (ref 8.9–10.3)
Chloride: 110 mmol/L (ref 98–111)
Creatinine, Ser: 0.79 mg/dL (ref 0.44–1.00)
GFR, Estimated: >60 mL/min (ref >60)
Glucose, Bld: 127 mg/dL — ABNORMAL HIGH (ref 70–99)
Potassium: 4.1 mmol/L (ref 3.5–5.1)
Sodium: 141 mmol/L (ref 135–145)

## 2021-12-22 LAB — GLUCOSE, CAPILLARY
Glucose-Capillary: 106 mg/dL — ABNORMAL HIGH (ref 70–99)
Glucose-Capillary: 122 mg/dL — ABNORMAL HIGH (ref 70–99)
Glucose-Capillary: 126 mg/dL — ABNORMAL HIGH (ref 70–99)
Glucose-Capillary: 152 mg/dL — ABNORMAL HIGH (ref 70–99)
Glucose-Capillary: 160 mg/dL — ABNORMAL HIGH (ref 70–99)

## 2021-12-22 LAB — MAGNESIUM: Magnesium: 2.3 mg/dL (ref 1.7–2.4)

## 2021-12-22 LAB — CBC
HCT: 36.6 % (ref 36.0–46.0)
Hemoglobin: 11.9 g/dL — ABNORMAL LOW (ref 12.0–15.0)
MCH: 25.3 pg — ABNORMAL LOW (ref 26.0–34.0)
MCHC: 32.5 g/dL (ref 30.0–36.0)
MCV: 77.7 fL — ABNORMAL LOW (ref 80.0–100.0)
Platelets: 336 10*3/uL (ref 150–400)
RBC: 4.71 MIL/uL (ref 3.87–5.11)
RDW: 16.6 % — ABNORMAL HIGH (ref 11.5–15.5)
WBC: 15.9 10*3/uL — ABNORMAL HIGH (ref 4.0–10.5)
nRBC: 0.1 % (ref 0.0–0.2)

## 2021-12-22 MED ORDER — CALCIUM CARBONATE ANTACID 500 MG PO CHEW
1.0000 | CHEWABLE_TABLET | Freq: Once | ORAL | Status: AC
  Administered 2021-12-22: 200 mg via ORAL
  Filled 2021-12-22: qty 1

## 2021-12-22 MED ORDER — POLYETHYLENE GLYCOL 3350 17 G PO PACK
17.0000 g | PACK | Freq: Every day | ORAL | Status: DC
  Administered 2021-12-22: 17 g via ORAL
  Filled 2021-12-22 (×2): qty 1

## 2021-12-22 MED ORDER — DOCUSATE SODIUM 100 MG PO CAPS
100.0000 mg | ORAL_CAPSULE | Freq: Two times a day (BID) | ORAL | Status: DC
  Administered 2021-12-22 – 2021-12-23 (×3): 100 mg via ORAL
  Filled 2021-12-22 (×3): qty 1

## 2021-12-22 MED ORDER — RIVAROXABAN 20 MG PO TABS
20.0000 mg | ORAL_TABLET | Freq: Every day | ORAL | Status: DC
  Administered 2021-12-22: 20 mg via ORAL
  Filled 2021-12-22: qty 1

## 2021-12-22 NOTE — Anesthesia Postprocedure Evaluation (Signed)
Anesthesia Post Note  Patient: [name]  Procedure(s) Performed: LAPAROSCOPIC CHOLECYSTECTOMY WITH INTRAOPERATIVE CHOLANGIOGRAM     Patient location during evaluation: PACU Anesthesia Type: General Level of consciousness: awake and alert Pain management: pain level controlled Vital Signs Assessment: post-procedure vital signs reviewed and stable Respiratory status: spontaneous breathing, nonlabored ventilation, respiratory function stable and patient connected to nasal cannula oxygen Cardiovascular status: blood pressure returned to baseline and stable Postop Assessment: no apparent nausea or vomiting Anesthetic complications: no Comments: Transient episode of delirium in PACU.    No notable events documented.            Effie Berkshire

## 2021-12-22 NOTE — Progress Notes (Signed)
PROGRESS NOTE    [name]  TZG:[id number] DOB: [birth year] DOA: 12/18/2021 PCP: [name], MD   Brief Narrative:  76 year old female with history of paroxysmal A-fib on Xarelto, moderate persistent asthma, diabetes mellitus type 2, essential hypertension presented with worsening abdominal pain.  On presentation, WBC was 7500, lipase 23, LFTs normal.  CT abdomen/pelvis with contrast showed multiple gallstones within the gallbladder associated with gallbladder distention, no evidence of common bile duct dilatation or choledocholithiasis.  Right upper quadrant ultrasound showed distended gallbladder with multiple stones and associated gallbladder wall thickening as well as positive sonographic Murphy sign.  She was started on IV antibiotics and fluids.  General surgery was consulted who is planning a laparoscopic cholecystectomy with IOC 6/7.  Patient tolerated the procedure well but postoperatively was having significant discomfort, also had drain in place.   Assessment & Plan:  Principal Problem:   Acute calculous cholecystitis Active Problems:   Pulmonary hypertension (HCC)   Asthma, moderate persistent   History of pulmonary embolism   Diabetes mellitus with neuropathy (HCC)   Essential hypertension   GERD (gastroesophageal reflux disease)   Paroxysmal atrial fibrillation (HCC)   Mild obstructive sleep apnea   Obstructive sleep apnea treated with continuous positive airway pressure (CPAP)   Chronic anticoagulation - Xerelto   Hyperthyroidism   Leukocytosis     Assessment and Plan:  Sepsis secondary to acute calculus cholecystitis status post laparoscopic cholecystectomy on 6/7 -Sepsis present on admission. -Right upper quadrant ultrasound consistent with acute calculus cholecystitis.  Continue on IV antibiotics, will defer need for antibiotics to general surgery.  Surgery is planning to remove her drain today.  We will slowly give diet as tolerated.  Increase bowel regimen  as necessary.   Paroxysmal A-fib -Rate controlled.  T cleared to resume Xarelto  Moderate persistent asthma -Bronchodilators.   Essential hypertension -IV as needed ordered  Hyperthyroidism -On methimazole  Diabetes mellitus type 2 -A1c 5.3.  Home meds on hold.  Sliding scale and Accu-Cheks.   Obesity -Outpatient follow-up    DVT prophylaxis: Xarelto  Code Status: Full code Family Communication:  Called sister Neoma Laming- no answer  Status is: Inpatient Remains inpatient appropriate because: Slowly advance diet as tolerated.  JP drain to be removed.  If she continues to tolerate her oral intake and has return of bowel function she might be able to go home tomorrow  Subjective: Still having quite a bit of abdominal pain.  Passing some gas.  Denies any nausea vomiting.  Remains afebrile.  Examination:  Constitutional: Not in acute distress Respiratory: Clear to auscultation bilaterally Cardiovascular: Normal sinus rhythm, no rubs Abdomen: Abdomen is slightly tender to touch.  Drain in place.  Diminished bowel sounds but slightly better compared to yesterday Musculoskeletal: No edema noted Skin: No rashes seen Neurologic: CN 2-12 grossly intact.  And nonfocal Psychiatric: Normal judgment and insight. Alert and oriented x 3. Normal mood. Objective: Vitals:   12/21/21 1922 12/21/21 2138 12/22/21 0500 12/22/21 0608  BP:  (!) 154/93  (!) 153/80  Pulse:  (!) 120  82  Resp: '17 18  19  '$ Temp:  97.9 F (36.6 C)  97.9 F (36.6 C)  TempSrc:  Oral  Oral  SpO2:  100%  98%  Weight:   87.8 kg   Height:        Intake/Output Summary (Last 24 hours) at 12/22/2021 1045 Last data filed at 12/22/2021 0900 Gross per 24 hour  Intake 340 ml  Output 910 ml  Net -570 ml   Filed Weights   12/20/21 1022 12/21/21 0516 12/22/21 0500  Weight: 85.9 kg 83 kg 87.8 kg     Data Reviewed:   CBC: Recent Labs  Lab 12/18/21 1425 12/19/21 0526 12/20/21 0442 12/21/21 0512 12/22/21 0511   WBC 7.5 21.5* 29.5* 23.5* 15.9*  NEUTROABS  --  17.7* 24.8*  --   --   HGB 11.9* 12.0 12.3 11.1* 11.9*  HCT 36.1 36.8 38.1 34.4* 36.6  MCV 78.6* 78.5* 79.7* 79.6* 77.7*  PLT 221 288 276 265 631   Basic Metabolic Panel: Recent Labs  Lab 12/18/21 1425 12/18/21 2018 12/19/21 0526 12/20/21 0442 12/21/21 0512 12/22/21 0511  NA 142  --  139 144 139 141  K 3.8  --  4.1 4.5 4.6 4.1  CL 112*  --  107 113* 109 110  CO2 24  --  25 20* 23 23  GLUCOSE 121*  --  136* 94 203* 127*  BUN 12  --  '12 12 19 20  '$ CREATININE 0.84  --  1.00 0.90 0.98 0.79  CALCIUM 8.1*  --  8.6* 8.9 8.8* 8.8*  MG  --  2.1 2.4 2.4 2.4 2.3   GFR: Estimated Creatinine Clearance: 65.1 mL/min (by C-G formula based on SCr of 0.79 mg/dL). Liver Function Tests: Recent Labs  Lab 12/18/21 1425 12/19/21 0526 12/20/21 0442  AST 22 25 33  ALT '9 13 12  '$ ALKPHOS 74 80 90  BILITOT 0.7 1.6* 2.4*  PROT 6.3* 6.4* 6.4*  ALBUMIN 3.4* 3.5 3.1*   Recent Labs  Lab 12/18/21 1425  LIPASE 23   No results for input(s): "AMMONIA" in the last 168 hours. Coagulation Profile: Recent Labs  Lab 12/19/21 0526  INR 1.8*   Cardiac Enzymes: No results for input(s): "CKTOTAL", "CKMB", "CKMBINDEX", "TROPONINI" in the last 168 hours. BNP (last 3 results) No results for input(s): "PROBNP" in the last 8760 hours. HbA1C: No results for input(s): "HGBA1C" in the last 72 hours. CBG: Recent Labs  Lab 12/21/21 0558 12/21/21 1112 12/21/21 1819 12/22/21 0004 12/22/21 0606  GLUCAP 175* 213* 158* 152* 126*   Lipid Profile: No results for input(s): "CHOL", "HDL", "LDLCALC", "TRIG", "CHOLHDL", "LDLDIRECT" in the last 72 hours. Thyroid Function Tests: No results for input(s): "TSH", "T4TOTAL", "FREET4", "T3FREE", "THYROIDAB" in the last 72 hours. Anemia Panel: No results for input(s): "VITAMINB12", "FOLATE", "FERRITIN", "TIBC", "IRON", "RETICCTPCT" in the last 72 hours. Sepsis Labs: No results for input(s): "PROCALCITON",  "LATICACIDVEN" in the last 168 hours.  Recent Results (from the past 240 hour(s))  WET PREP BY MOLECULAR PROBE     Status: None   Collection Time: 12/12/21  5:02 PM   Specimen: Genital  Result Value Ref Range Status   MICRO NUMBER: [id number]  Final   SPECIMEN QUALITY: Adequate  Final   SOURCE: VAGINA  Final   STATUS: FINAL  Final   Trichomonas vaginosis Not Detected  Final   Gardnerella vaginalis Not Detected  Final   Candida species Not Detected  Final         Radiology Studies: DG Cholangiogram Operative  Result Date: 12/20/2021 CLINICAL DATA:  Cholecystectomy for cholelithiasis and acute cholecystitis. EXAM: INTRAOPERATIVE CHOLANGIOGRAM TECHNIQUE: Cholangiographic images from the C-arm fluoroscopic device were submitted for interpretation post-operatively. Please see the procedural report for the amount of contrast and the fluoroscopy time utilized. FLUOROSCOPY: Radiation Exposure Index (as provided by the fluoroscopic device): 7.3 mGy Kerma COMPARISON:  Right upper quadrant ultrasound 12/18/2021 FINDINGS: Intraoperative imaging with  a C-arm demonstrates normal opacified cystic duct stump, common bile duct and visualized intrahepatic ducts. No evidence of biliary dilatation, stricture, filling defect or contrast extravasation. Contrast enters the duodenum normally. IMPRESSION: Normal intraoperative cholangiogram. Electronically Signed   By: Aletta Edouard M.D.   On: 12/20/2021 12:41        Scheduled Meds:  acetaminophen  650 mg Oral Q6H   docusate sodium  100 mg Oral BID   insulin aspart  0-6 Units Subcutaneous Q6H   lip balm  1 application  Topical BID   methimazole  5 mg Oral Daily   mometasone-formoterol  2 puff Inhalation BID   montelukast  10 mg Oral QHS   pantoprazole (PROTONIX) IV  40 mg Intravenous Q24H   polyethylene glycol  17 g Oral Daily   umeclidinium bromide  1 puff Inhalation Daily   Continuous Infusions:  ondansetron (ZOFRAN) IV       LOS: 4 days    Time spent= 35 mins    Fahed Morten Arsenio Loader, MD [facility]  If 7PM-7AM, please contact night-coverage  12/22/2021, 10:45 AM

## 2021-12-22 NOTE — Consult Note (Signed)
   [facility] Inpatient Consult   12/22/2021  [name] 07-May-1946 [id number]  [facility] [ACO] Patient: Medicare ACO REACH  *Coverage for [facility] Liaison, remote review, patient at [facility]  Primary Care Provider:  [name], MD is with [facility] at [facility], is an embedded provider with a Chronic Care Management team and program, and is listed for the transition of care follow up and appointments. Patient is post op  Patient was screened for extreme high risk score and reviewed for Embedded practice service needs for chronic care management.  Plan:  No current Embedded Care Management for  care coordination or chronic care management for post hospital needs noted.  Please contact for further questions,  Natividad Brood, RN BSN [facility] Liaison  [phone] business mobile phone Toll free office [phone]  Fax number: [phone] [email or web address]

## 2021-12-22 NOTE — Progress Notes (Signed)
Progress Note  2 Days Post-Op  Subjective: Pt reports poor appetite this AM. Not overtly nauseated but not passing much flatus and feels like she needs to have a BM but can't. She reports constipation is somewhat of a chronic issue for her. Pain overall well controlled. Mobilizing with assistance.   Objective: Vital signs in last 24 hours: Temp:  [97.7 F (36.5 C)-97.9 F (36.6 C)] 97.9 F (36.6 C) (06/09 5093) Pulse Rate:  [82-120] 82 (06/09 0608) Resp:  [17-20] 19 (06/09 0608) BP: (112-154)/(80-93) 153/80 (06/09 0608) SpO2:  [98 %-100 %] 98 % (06/09 2671) Weight:  [87.8 kg] 87.8 kg (06/09 0500) Last BM Date : 12/21/21  Intake/Output from previous day: 06/08 0701 - 06/09 0700 In: 220 [P.O.:120; IV Piggyback:100] Out: 1210 [Urine:1050; Drains:160] Intake/Output this shift: Total I/O In: 120 [P.O.:120] Out: -   PE: General: pleasant, WD, elderly female who is sitting up, NAD HEENT: sclera anicteric and not injected Heart: regular, rate, and rhythm. Palpable radial and pedal pulses bilaterally Lungs:  Respiratory effort nonlabored Abd: soft, appropriately ttp, ND, +BS, drain SS, incisions C/D/I Skin: no rash noted  Psych: A&Ox3 with an appropriate affect.    Lab Results:  Recent Labs    12/21/21 0512 12/22/21 0511  WBC 23.5* 15.9*  HGB 11.1* 11.9*  HCT 34.4* 36.6  PLT 265 336    BMET Recent Labs    12/21/21 0512 12/22/21 0511  NA 139 141  K 4.6 4.1  CL 109 110  CO2 23 23  GLUCOSE 203* 127*  BUN 19 20  CREATININE 0.98 0.79  CALCIUM 8.8* 8.8*    PT/INR No results for input(s): "LABPROT", "INR" in the last 72 hours.  CMP     Component Value Date/Time   NA 141 12/22/2021 0511   NA 140 11/21/2020 1026   K 4.1 12/22/2021 0511   CL 110 12/22/2021 0511   CO2 23 12/22/2021 0511   GLUCOSE 127 (H) 12/22/2021 0511   BUN 20 12/22/2021 0511   BUN 16 11/21/2020 1026   CREATININE 0.79 12/22/2021 0511   CREATININE 1.01 (H) 12/30/2020 1635   CALCIUM 8.8  (L) 12/22/2021 0511   PROT 6.4 (L) 12/20/2021 0442   ALBUMIN 3.1 (L) 12/20/2021 0442   AST 33 12/20/2021 0442   ALT 12 12/20/2021 0442   ALKPHOS 90 12/20/2021 0442   BILITOT 2.4 (H) 12/20/2021 0442   GFRNONAA >60 12/22/2021 0511   GFRNONAA 53 (L) 01/04/2020 1059   GFRAA 62 01/04/2020 1059   Lipase     Component Value Date/Time   LIPASE 23 12/18/2021 1425       Studies/Results: DG Cholangiogram Operative  Result Date: 12/20/2021 CLINICAL DATA:  Cholecystectomy for cholelithiasis and acute cholecystitis. EXAM: INTRAOPERATIVE CHOLANGIOGRAM TECHNIQUE: Cholangiographic images from the C-arm fluoroscopic device were submitted for interpretation post-operatively. Please see the procedural report for the amount of contrast and the fluoroscopy time utilized. FLUOROSCOPY: Radiation Exposure Index (as provided by the fluoroscopic device): 7.3 mGy Kerma COMPARISON:  Right upper quadrant ultrasound 12/18/2021 FINDINGS: Intraoperative imaging with a C-arm demonstrates normal opacified cystic duct stump, common bile duct and visualized intrahepatic ducts. No evidence of biliary dilatation, stricture, filling defect or contrast extravasation. Contrast enters the duodenum normally. IMPRESSION: Normal intraoperative cholangiogram. Electronically Signed   By: Aletta Edouard M.D.   On: 12/20/2021 12:41    Anti-infectives: Anti-infectives (From admission, onward)    Start     Dose/Rate Route Frequency Ordered Stop   12/19/21 2000  ceFEPIme (MAXIPIME)  2 g in sodium chloride 0.9 % 100 mL IVPB  Status:  Discontinued        2 g 200 mL/hr over 30 Minutes Intravenous Every 12 hours 12/19/21 1248 12/21/21 0844   12/18/21 2000  ciprofloxacin (CIPRO) IVPB 400 mg  Status:  Discontinued        400 mg 200 mL/hr over 60 Minutes Intravenous Every 12 hours 12/18/21 1901 12/19/21 1240   12/18/21 2000  metroNIDAZOLE (FLAGYL) IVPB 500 mg  Status:  Discontinued        500 mg 100 mL/hr over 60 Minutes Intravenous Every  12 hours 12/18/21 1901 12/21/21 0844        Assessment/Plan Acute gangrenous cholecystitis  POD2 S/P laparoscopic cholecystectomy Dr. Harlow Asa - pt mobilizing well with mild pain as expected - drain with SS fluid, ok to remove today  - diet as tolerated, increase bowel regimen  - no further abx needed  - possible DC later today vs tomorrow if appetite better and having more bowel function    FEN: HH/CM diet  VTE: SCDs, ok to restart xarelto  ID: abx stopped 6/8  - Per TRH - Moderate persistent asthma  Afib on xarelto   DM2  HTN  LOS: 4 days      Norm Parcel, [facility] Surgery 12/22/2021, 10:08 AM Please see Amion for pager number during day hours 7:00am-4:30pm

## 2021-12-23 DIAGNOSIS — K8000 Calculus of gallbladder with acute cholecystitis without obstruction: Secondary | ICD-10-CM | POA: Diagnosis not present

## 2021-12-23 LAB — CBC
HCT: 34.8 % — ABNORMAL LOW (ref 36.0–46.0)
Hemoglobin: 11.6 g/dL — ABNORMAL LOW (ref 12.0–15.0)
MCH: 25.6 pg — ABNORMAL LOW (ref 26.0–34.0)
MCHC: 33.3 g/dL (ref 30.0–36.0)
MCV: 76.7 fL — ABNORMAL LOW (ref 80.0–100.0)
Platelets: 284 10*3/uL (ref 150–400)
RBC: 4.54 MIL/uL (ref 3.87–5.11)
RDW: 16.6 % — ABNORMAL HIGH (ref 11.5–15.5)
WBC: 7.3 10*3/uL (ref 4.0–10.5)
nRBC: 0.5 % — ABNORMAL HIGH (ref 0.0–0.2)

## 2021-12-23 LAB — MAGNESIUM: Magnesium: 1.9 mg/dL (ref 1.7–2.4)

## 2021-12-23 LAB — BASIC METABOLIC PANEL
Anion gap: 6 (ref 5–15)
BUN: 13 mg/dL (ref 8–23)
CO2: 23 mmol/L (ref 22–32)
Calcium: 8.5 mg/dL — ABNORMAL LOW (ref 8.9–10.3)
Chloride: 111 mmol/L (ref 98–111)
Creatinine, Ser: 0.66 mg/dL (ref 0.44–1.00)
GFR, Estimated: >60 mL/min (ref >60)
Glucose, Bld: 89 mg/dL (ref 70–99)
Potassium: 3.8 mmol/L (ref 3.5–5.1)
Sodium: 140 mmol/L (ref 135–145)

## 2021-12-23 LAB — GLUCOSE, CAPILLARY
Glucose-Capillary: 95 mg/dL (ref 70–99)
Glucose-Capillary: 118 mg/dL — ABNORMAL HIGH (ref 70–99)

## 2021-12-23 MED ORDER — TRAMADOL HCL 50 MG PO TABS: 50.0000 mg | ORAL_TABLET | Freq: Four times a day (QID) | ORAL | 0 refills | Status: DC | PRN

## 2021-12-23 MED ORDER — ONDANSETRON HCL 4 MG PO TABS: 4.0000 mg | ORAL_TABLET | Freq: Three times a day (TID) | ORAL | 2 refills | Status: DC | PRN

## 2021-12-23 NOTE — Progress Notes (Signed)
[name] [id number] August 15, 1945  CARE TEAM:  PCP: [name], MD  Outpatient Care Team: Patient Care Team: [name], MD as PCP - General (Family Medicine) Croitoru, Dani Gobble, MD as PCP - Cardiology (Cardiology) Hortencia Pilar, MD as Consulting Physician (Ophthalmology) Neldon Mc, Donnamarie Poag, MD as Consulting Physician (Allergy and Immunology) Anda Kraft, MD as Consulting Physician (Endocrinology) Inocencio Homes, DPM as Consulting Physician (Podiatry) Croitoru, Dani Gobble, MD as Consulting Physician (Cardiology) Druscilla Brownie, MD as Consulting Physician (Dermatology) Boyd Kerbs, MD as Referring Physician (Specialist) Dionisio David, [place] as Referring Physician (Chiropractic Medicine) Ronald Lobo, MD as Consulting Physician (Gastroenterology) Debbora Dus, [facility] as Pharmacist (Pharmacist) Hallows, Verlee Monte, MD (Orthopedic Surgery) Dohmeier, Asencion Partridge, MD as Consulting Physician (Neurology)  Inpatient Treatment Team: Treatment Team: Attending Provider: Damita Lack, MD; Consulting Physician: Edison Pace, Md, MD; Rounding Team: Garner Gavel, MD; Technician: Vernie Murders, [place]; Utilization Review: Alease Medina, RN; Technician: Antoine Primas; Registered Nurse: Carney Harder, RN   Problem List:   Principal Problem:   Acute calculous cholecystitis Active Problems:   Pulmonary hypertension ([place])   Asthma, moderate persistent   History of pulmonary embolism   Diabetes mellitus with neuropathy [place] [facility])   Essential hypertension   GERD (gastroesophageal reflux disease)   Paroxysmal atrial fibrillation ([place])   Mild obstructive sleep apnea   Obstructive sleep apnea treated with continuous positive airway pressure (CPAP)   Chronic anticoagulation - Xerelto   Hyperthyroidism   Leukocytosis   3 Days Post-Op  12/20/2021  Pre-operative Diagnosis:  Acute cholecystitis, cholelithiasis   Post-operative Diagnosis:  same   Surgeon:  Armandina Gemma, MD   Procedure:   Laparoscopic cholecystectomy with intra-operative cholangiography  PATHOLOGY:   A. GALLBLADDER, CHOLECYSTECTOMY:  - Acute calculous cholecystitis with necrosis.  No dysplasia or  malignancy.   Assessment  Recovering   [facility] Stay = 5 days)  Assessment/Plan Acute gangrenous cholecystitis  POD2 S/P laparoscopic cholecystectomy Dr. Harlow Asa - pt mobilizing well with mild pain as expected - solid diet as tolerated, increase bowel regimen  - no further abx needed  - surgical drain removed 6/9 - labs OK Hgb stable -Okay to discharge from surgery standpoint.  Surgery will follow more peripherally.  Plan for outpatient follow-up in office in a few weeks  FEN: Heart solid diet  tolerated VTE: SCDs, Xarelto restarted POD#2 = 6/9.  Hgb 11s stable ID: ABx stopped POD#1 = 6/8.    - Per [facility] - Moderate persistent asthma  Afib on xarelto   DM2  HTN  Disposition: Per medicine service.  Suspect patient can leave this weekend       I reviewed nursing notes, hospitalist notes, last 24 h vitals and pain scores, last 48 h intake and output, last 24 h labs and trends, and last 24 h imaging results. I have reviewed this patient's available data, including medical history, events of note, test results, etc as part of my evaluation.  A significant portion of that time was spent in counseling.  Care during the described time interval was provided by me.  This care required moderate level of medical decision making.  12/23/2021    Subjective: (Chief complaint)  No major events.    Objective:  Vital signs:  Vitals:   12/22/21 2002 12/22/21 2032 12/23/21 0100 12/23/21 0446  BP: (!) 153/95   139/80  Pulse: 76 75 65 68  Resp: '20 20 10 18  '$ Temp: 98.2 F (36.8 C)   97.9 F (36.6 C)  TempSrc:  Oral   Oral  SpO2: 100%   100%  Weight:    88.2 kg  Height:        Last BM Date : 12/21/21  Intake/Output   Yesterday:  06/09 0701 - 06/10 0700 In: 600 [P.O.:600] Out: 50  [Drains:50] This shift:  No intake/output data recorded.  Bowel function:  Flatus: YES  BM:  YES  Drain: (No drain)   Physical Exam:  General: Pt awake/alert in no acute distress Eyes: PERRL, normal EOM.  Sclera clear.  No icterus Neuro: CN II-XII intact w/o focal sensory/motor deficits. Lymph: No head/neck/groin lymphadenopathy Psych:  No delerium/psychosis/paranoia.  Oriented x 4 HENT: Normocephalic, Mucus membranes moist.  No thrush Neck: Supple, No tracheal deviation.  No obvious thyromegaly Chest: No pain to chest wall compression.  Good respiratory excursion.  No audible wheezing CV:  Pulses intact.  Regular rhythm.  No major extremity edema MS: Normal AROM mjr joints.  No obvious deformity  Abdomen: Soft.  Nondistended.  Mildly tender at incisions only.  No evidence of peritonitis.  No incarcerated hernias.  Ext:   No deformity.  No mjr edema.  No cyanosis Skin: No petechiae / purpurea.  No major sores.  Warm and dry    Results:   Cultures: Recent Results (from the past 720 hour(s))  WET PREP BY MOLECULAR PROBE     Status: None   Collection Time: 12/12/21  5:02 PM   Specimen: Genital  Result Value Ref Range Status   MICRO NUMBER: [id number]  Final   SPECIMEN QUALITY: Adequate  Final   SOURCE: VAGINA  Final   STATUS: FINAL  Final   Trichomonas vaginosis Not Detected  Final   Gardnerella vaginalis Not Detected  Final   Candida species Not Detected  Final    Labs: Results for orders placed or performed during the hospital encounter of 12/18/21 (from the past 48 hour(s))  Glucose, capillary     Status: Abnormal   Collection Time: 12/21/21 11:12 AM  Result Value Ref Range   Glucose-Capillary 213 (H) 70 - 99 mg/dL    Comment: Glucose reference range applies only to samples taken after fasting for at least 8 hours.  Glucose, capillary     Status: Abnormal   Collection Time: 12/21/21  6:19 PM  Result Value Ref Range   Glucose-Capillary 158 (H) 70 - 99 mg/dL     Comment: Glucose reference range applies only to samples taken after fasting for at least 8 hours.  Glucose, capillary     Status: Abnormal   Collection Time: 12/22/21 12:04 AM  Result Value Ref Range   Glucose-Capillary 152 (H) 70 - 99 mg/dL    Comment: Glucose reference range applies only to samples taken after fasting for at least 8 hours.  Basic metabolic panel     Status: Abnormal   Collection Time: 12/22/21  5:11 AM  Result Value Ref Range   Sodium 141 135 - 145 mmol/L   Potassium 4.1 3.5 - 5.1 mmol/L   Chloride 110 98 - 111 mmol/L   CO2 23 22 - 32 mmol/L   Glucose, Bld 127 (H) 70 - 99 mg/dL    Comment: Glucose reference range applies only to samples taken after fasting for at least 8 hours.   BUN 20 8 - 23 mg/dL   Creatinine, Ser 0.79 0.44 - 1.00 mg/dL   Calcium 8.8 (L) 8.9 - 10.3 mg/dL   GFR, Estimated >60 >60 mL/min    Comment: (NOTE) Calculated using the  CKD-EPI Creatinine Equation (2021)    Anion gap 8 5 - 15    Comment: Performed at [facility], [place] [address], [place], [place] [address]  CBC     Status: Abnormal   Collection Time: 12/22/21  5:11 AM  Result Value Ref Range   WBC 15.9 (H) 4.0 - 10.5 K/uL   RBC 4.71 3.87 - 5.11 MIL/uL   Hemoglobin 11.9 (L) 12.0 - 15.0 g/dL   HCT 36.6 36.0 - 46.0 %   MCV 77.7 (L) 80.0 - 100.0 fL   MCH 25.3 (L) 26.0 - 34.0 pg   MCHC 32.5 30.0 - 36.0 g/dL   RDW 16.6 (H) 11.5 - 15.5 %   Platelets 336 150 - 400 K/uL   nRBC 0.1 0.0 - 0.2 %    Comment: Performed at [facility], [place] [address] [place], [place] [address]  Magnesium     Status: None   Collection Time: 12/22/21  5:11 AM  Result Value Ref Range   Magnesium 2.3 1.7 - 2.4 mg/dL    Comment: Performed at [facility], [place] [address] [place], [place] [address]  Glucose, capillary     Status: Abnormal   Collection Time: 12/22/21  6:06 AM  Result Value Ref Range   Glucose-Capillary 126 (H) 70 - 99 mg/dL    Comment:  Glucose reference range applies only to samples taken after fasting for at least 8 hours.  Glucose, capillary     Status: Abnormal   Collection Time: 12/22/21 11:44 AM  Result Value Ref Range   Glucose-Capillary 160 (H) 70 - 99 mg/dL    Comment: Glucose reference range applies only to samples taken after fasting for at least 8 hours.  Glucose, capillary     Status: Abnormal   Collection Time: 12/22/21  5:42 PM  Result Value Ref Range   Glucose-Capillary 122 (H) 70 - 99 mg/dL    Comment: Glucose reference range applies only to samples taken after fasting for at least 8 hours.  Glucose, capillary     Status: Abnormal   Collection Time: 12/22/21 11:53 PM  Result Value Ref Range   Glucose-Capillary 106 (H) 70 - 99 mg/dL    Comment: Glucose reference range applies only to samples taken after fasting for at least 8 hours.  Glucose, capillary     Status: None   Collection Time: 12/23/21  4:45 AM  Result Value Ref Range   Glucose-Capillary 95 70 - 99 mg/dL    Comment: Glucose reference range applies only to samples taken after fasting for at least 8 hours.  Basic metabolic panel     Status: Abnormal   Collection Time: 12/23/21  5:44 AM  Result Value Ref Range   Sodium 140 135 - 145 mmol/L   Potassium 3.8 3.5 - 5.1 mmol/L   Chloride 111 98 - 111 mmol/L   CO2 23 22 - 32 mmol/L   Glucose, Bld 89 70 - 99 mg/dL    Comment: Glucose reference range applies only to samples taken after fasting for at least 8 hours.   BUN 13 8 - 23 mg/dL   Creatinine, Ser 0.66 0.44 - 1.00 mg/dL   Calcium 8.5 (L) 8.9 - 10.3 mg/dL   GFR, Estimated >60 >60 mL/min    Comment: (NOTE) Calculated using the CKD-EPI Creatinine Equation (2021)    Anion gap 6 5 - 15    Comment: Performed at [facility], [place] [address] [place], [name]  [address]  CBC     Status: Abnormal   Collection Time: 12/23/21  5:44 AM  Result Value Ref Range   WBC 7.3 4.0 - 10.5 K/uL   RBC 4.54 3.87 - 5.11 MIL/uL   Hemoglobin  11.6 (L) 12.0 - 15.0 g/dL   HCT 34.8 (L) 36.0 - 46.0 %   MCV 76.7 (L) 80.0 - 100.0 fL   MCH 25.6 (L) 26.0 - 34.0 pg   MCHC 33.3 30.0 - 36.0 g/dL   RDW 16.6 (H) 11.5 - 15.5 %   Platelets 284 150 - 400 K/uL   nRBC 0.5 (H) 0.0 - 0.2 %    Comment: Performed at [facility], [place] [address], [place], [place] [address]  Magnesium     Status: None   Collection Time: 12/23/21  5:44 AM  Result Value Ref Range   Magnesium 1.9 1.7 - 2.4 mg/dL    Comment: Performed at [facility], [place] [address] [place], [place] [address]   *Note: Due to a large number of results and/or encounters for the requested time period, some results have not been displayed. A complete set of results can be found in Results Review.    Imaging / Studies: No results found.  Medications / Allergies: per chart  Antibiotics: Anti-infectives (From admission, onward)    Start     Dose/Rate Route Frequency Ordered Stop   12/19/21 2000  ceFEPIme (MAXIPIME) 2 g in sodium chloride 0.9 % 100 mL IVPB  Status:  Discontinued        2 g 200 mL/hr over 30 Minutes Intravenous Every 12 hours 12/19/21 1248 12/21/21 0844   12/18/21 2000  ciprofloxacin (CIPRO) IVPB 400 mg  Status:  Discontinued        400 mg 200 mL/hr over 60 Minutes Intravenous Every 12 hours 12/18/21 1901 12/19/21 1240   12/18/21 2000  metroNIDAZOLE (FLAGYL) IVPB 500 mg  Status:  Discontinued        500 mg 100 mL/hr over 60 Minutes Intravenous Every 12 hours 12/18/21 1901 12/21/21 0844         Note: Portions of this report may have been transcribed using voice recognition software. Every effort was made to ensure accuracy; however, inadvertent computerized transcription errors may be present.   Any transcriptional errors that result from this process are unintentional.    [name], MD, FACS, MASCRS Esophageal, Gastrointestinal & Colorectal Surgery Robotic and Minimally Invasive Surgery  [facility], [place]  [place], [place], [place] [address] [phone] Fax [phone] Main  CONTACT INFORMATION:  Weekday (9AM-5PM): Call [facility] main office at [phone]  Weeknight (5PM-9AM) or Weekend/Holiday: Check [email or web address] (password " [email or web address]") for General Surgery [facility] coverage  (Please, do not use SecureChat as it is not reliable communication to operating surgeons for immediate patient care)      12/23/2021  8:19 AM

## 2021-12-23 NOTE — Discharge Summary (Signed)
Physician Discharge Summary  [name] QGB:[id number] DOB: 01-26-1946 DOA: 12/18/2021  PCP: [name], MD  Admit date: 12/18/2021 Discharge date: 12/23/2021  Admitted From: Home Disposition: Home  Recommendations for Outpatient Follow-up:  Follow up with PCP in 1-2 weeks Please obtain BMP/CBC in one week your next doctors visit.  Pain medication prescribed by general surgery team.  Follow-up to be arranged by their service Okay to resume Xarelto   Discharge Condition: Stable CODE STATUS: Full code Diet recommendation: Heart healthy  Brief/Interim Summary: 76 year old female with history of paroxysmal A-fib on Xarelto, moderate persistent asthma, diabetes mellitus type 2, essential hypertension presented with worsening abdominal pain.  On presentation, WBC was 7500, lipase 23, LFTs normal.  CT abdomen/pelvis with contrast showed multiple gallstones within the gallbladder associated with gallbladder distention, no evidence of common bile duct dilatation or choledocholithiasis.  Right upper quadrant ultrasound showed distended gallbladder with multiple stones and associated gallbladder wall thickening as well as positive sonographic Murphy sign.  She was started on IV antibiotics and fluids.  General surgery was consulted who is planning a laparoscopic cholecystectomy with IOC 6/7.  Patient tolerated the procedure well but postoperatively was having significant discomfort, also had drain in place.  Drain was eventually removed by general surgery, patient was tolerating oral without any issues.  Cleared for Xarelto to be restarted.  Today patient is medically doing well to be discharged home in stable condition.     Assessment & Plan:  Principal Problem:   Acute calculous cholecystitis Active Problems:   Pulmonary hypertension (HCC)   Asthma, moderate persistent   History of pulmonary embolism   Diabetes mellitus with neuropathy (HCC)   Essential hypertension   GERD  (gastroesophageal reflux disease)   Paroxysmal atrial fibrillation (HCC)   Mild obstructive sleep apnea   Obstructive sleep apnea treated with continuous positive airway pressure (CPAP)   Chronic anticoagulation - Xerelto   Hyperthyroidism   Leukocytosis       Assessment and Plan:   Sepsis secondary to acute calculus cholecystitis status post laparoscopic cholecystectomy on 6/7 -Sepsis present on admission. -Right upper quadrant ultrasound consistent with acute calculus cholecystitis.  Patient did well postoperatively.  No further antibiotics needed, drain has been removed.  Cleared by general surgery for discharge.  Pain medication prescribed by their service.  Follow-up will be arranged by general surgery as well.   Paroxysmal A-fib -Rate controlled.  Resume Xarelto  Moderate persistent asthma -Bronchodilators.   Essential hypertension - Continue home medications  Hyperthyroidism -On methimazole  Diabetes mellitus type 2 -A1c 5.3.  Resume home regimen   Obesity -Outpatient follow-up      Body mass index is 33.38 kg/m.       Discharge Diagnoses:  Principal Problem:   Acute calculous cholecystitis Active Problems:   Pulmonary hypertension (HCC)   Asthma, moderate persistent   History of pulmonary embolism   Diabetes mellitus with neuropathy (HCC)   Essential hypertension   GERD (gastroesophageal reflux disease)   Paroxysmal atrial fibrillation (HCC)   Mild obstructive sleep apnea   Obstructive sleep apnea treated with continuous positive airway pressure (CPAP)   Chronic anticoagulation - Xerelto   Hyperthyroidism   Leukocytosis      Consultations: General surgery  Subjective: Feels well no complaints.  No nausea vomiting.  Discharge Exam: Vitals:   12/23/21 0100 12/23/21 0446  BP:  139/80  Pulse: 65 68  Resp: 10 18  Temp:  97.9 F (36.6 C)  SpO2:  100%  Vitals:   12/22/21 2002 12/22/21 2032 12/23/21 0100 12/23/21 0446  BP: (!) 153/95    139/80  Pulse: 76 75 65 68  Resp: _0 Temp: 98.2 F (36.8 C)   97.9 F (36.6 C)  TempSrc: Oral   Oral  SpO2: 100%   100%  Weight:    88.2 kg  Height:        General: Pt is alert, awake, not in acute distress Cardiovascular: RRR, S1/S2 +, no rubs, no gallops Respiratory: CTA bilaterally, no wheezing, no rhonchi Abdominal: Soft, NT, ND, bowel sounds + Extremities: no edema, no cyanosis  Discharge Instructions  Discharge Instructions     Call MD for:   Complete by: As directed    FEVER > 101.5 F  (temperatures < 101.5 F are not significant)   Call MD for:  extreme fatigue   Complete by: As directed    Call MD for:  persistant dizziness or light-headedness   Complete by: As directed    Call MD for:  persistant nausea and vomiting   Complete by: As directed    Call MD for:  redness, tenderness, or signs of infection (pain, swelling, redness, odor or green/yellow discharge around incision site)   Complete by: As directed    Call MD for:  severe uncontrolled pain   Complete by: As directed    Diet - low sodium heart healthy   Complete by: As directed    Start with a bland diet such as soups, liquids, starchy foods, low fat foods, etc. the first few days at home. Gradually advance to a solid, low-fat, high fiber diet by the end of the first week at home.   Add a fiber supplement to your diet (Metamucil, etc) If you feel full, bloated, or constipated, stay on a full liquid or pureed/blenderized diet for a few days until you feel better and are no longer constipated.   Discharge instructions   Complete by: As directed    See Discharge Instructions If you are not getting better after two weeks or are noticing you are getting worse, contact our office [phone] for further advice.  We may need to adjust your medications, re-evaluate you in the office, send you to the emergency room, or see what other things we can do to help. The clinic staff is available to answer  your questions during regular business hours (8:30am-5pm).  Please don't hesitate to call and ask to speak to one of our nurses for clinical concerns.    A surgeon from [facility] is always on call at the hospitals 24 hours/day If you have a medical emergency, go to the nearest emergency room or call 911.   Discharge wound care:   Complete by: As directed    It is good for closed incisions and even open wounds to be washed every day.  Shower every day.  Short baths are fine.  Wash the incisions and wounds clean with soap & water.    You may leave closed incisions open to air if it is dry.   You may cover the incision with clean gauze & replace it after your daily shower for comfort.  DERMABOND:  You have purple skin glue (Dermabond) on your incision(s).  Leave them in place, and they will fall off on their own like a scab in 2-3 weeks.  You may trim any edges that curl up with clean scissors.   Driving Restrictions   Complete by: As  directed    You may drive when: - you are no longer taking narcotic prescription pain medication - you can comfortably wear a seatbelt - you can safely make sudden turns/stops without pain.   Increase activity slowly   Complete by: As directed    Start light daily activities --- self-care, walking, climbing stairs- beginning the day after surgery.  Gradually increase activities as tolerated.  Control your pain to be active.  Stop when you are tired.  Ideally, walk several times a day, eventually an hour a day.   Most people are back to most day-to-day activities in a few weeks.  It takes 4-6 weeks to get back to unrestricted, intense activity. If you can walk 30 minutes without difficulty, it is safe to try more intense activity such as jogging, treadmill, bicycling, low-impact aerobics, swimming, etc. Save the most intensive and strenuous activity for last (Usually 4-8 weeks after surgery) such as sit-ups, heavy lifting, contact sports, etc.  Refrain  from any intense heavy lifting or straining until you are off narcotics for pain control.  You will have off days, but things should improve week-by-week. DO NOT PUSH THROUGH PAIN.  Let pain be your guide: If it hurts to do something, don't do it.   Lifting restrictions   Complete by: As directed    If you can walk 30 minutes without difficulty, it is safe to try more intense activity such as jogging, treadmill, bicycling, low-impact aerobics, swimming, etc. Save the most intensive and strenuous activity for last (Usually 4-8 weeks after surgery) such as sit-ups, heavy lifting, contact sports, etc.   Refrain from any intense heavy lifting or straining until you are off narcotics for pain control.  You will have off days, but things should improve week-by-week. DO NOT PUSH THROUGH PAIN.  Let pain be your guide: If it hurts to do something, don't do it.  Pain is your body warning you to avoid that activity for another week until the pain goes down.   May shower / Bathe   Complete by: As directed    May walk up steps   Complete by: As directed    Remove dressing in 72 hours   Complete by: As directed    Make sure all dressings are removed by the third day after surgery.  Leave incisions open to air.  OK to cover incisions with gauze or bandages as desired   Sexual Activity Restrictions   Complete by: As directed    You may have sexual intercourse when it is comfortable. If it hurts to do something, stop.      Allergies as of 12/23/2021       Reactions   Dilantin [phenytoin] Swelling   Other reaction(s): facial swelling   Latex Hives   Oxycodone Nausea And Vomiting, Nausea Only   Other reaction(s): Abdominal Pain, Vomiting   Penicillins Swelling   Has patient had a PCN reaction causing immediate rash, facial/tongue/throat swelling, SOB or lightheadedness with hypotension patient had a PCN reaction causing severe rash involving mucus membranes or skin necrosis: [id number] Has patient had  a PCN reaction that required hospitalization/No Has patient had a PCN reaction occurring within the last 10 years: No If all of the above answers are "NO", then may proceed with Cephalosporin use. Other reaction(s): vomiting   Codeine Nausea Only   Tolerates tylenol with codeine   Hydrocodone Nausea And Vomiting   Nickel Hives, Rash   Pregabalin Other (See Comments)   Blurred vision. Other  reaction(s): headaches/problem w/vision        Medication List     STOP taking these medications    acetaminophen-codeine 300-30 MG tablet Commonly known as: TYLENOL #3   cyclobenzaprine 5 MG tablet Commonly known as: FLEXERIL   diclofenac Sodium 1 % Gel Commonly known as: Voltaren   oxyCODONE-acetaminophen 5-325 MG tablet Commonly known as: PERCOCET/ROXICET       TAKE these medications    albuterol 108 (90 Base) MCG/ACT inhaler Commonly known as: VENTOLIN HFA INHALE 2 PUFFS BY MOUTH EVERY 6 HOURS AS NEEDED FOR WHEEZING OR SHORTNESS OF BREATH What changed:  how much to take when to take this reasons to take this additional instructions   atorvastatin 10 MG tablet Commonly known as: LIPITOR TAKE 1 TABLET(10 MG) BY MOUTH DAILY What changed: See the new instructions.   DAIRY-RELIEF PO Take 1 capsule by mouth daily as needed (eating dairy).   EPINEPHrine 0.3 mg/0.3 mL Soaj injection Commonly known as: EPI-PEN USE AS DIRECTED FOR LIFE THREATENING ALLERGIC REACTIONS What changed:  how much to take how to take this when to take this reasons to take this additional instructions   famotidine 40 MG tablet Commonly known as: PEPCID Take 1 (ONE) tablet by mouth every evening. What changed:  how much to take how to take this when to take this additional instructions   fluticasone 220 MCG/ACT inhaler Commonly known as: FLOVENT HFA 2 puffs 2 times daily during Flare Up. What changed:  how much to take how to take this when to take this reasons to take  this additional instructions   fluticasone-salmeterol 230-21 MCG/ACT inhaler Commonly known as: Advair HFA INHALE 2 PUFFS BY MOUTH TWICE DAILY TO PREVENT COUGH OR WHEEZING. RINSE MOUTH AFTER USE What changed:  how much to take how to take this when to take this additional instructions   furosemide 20 MG tablet Commonly known as: LASIX Take 20 mg as needed for a weight gain of 3 pounds overnight or 5 pounds in one week,.   gabapentin 600 MG tablet Commonly known as: NEURONTIN TAKE 1 TABLET BY MOUTH EVERY DAY AT BREAKFAST, 1 TABLET AT LUNCH AND 2 TABLETS AT BEDTIME What changed:  how much to take how to take this when to take this additional instructions   Glyxambi 10-5 MG Tabs Generic drug: Empagliflozin-linaGLIPtin TAKE 1 TABLET BY MOUTH DAILY What changed: when to take this   hydrOXYzine 10 MG tablet Commonly known as: ATARAX Take 1 tablet (10 mg total) by mouth daily as needed for itching.   ipratropium 0.06 % nasal spray Commonly known as: ATROVENT 1-2 sprays in each nostril 1-2 times er day as needed to dry nose What changed:  how much to take how to take this when to take this reasons to take this additional instructions   irbesartan 150 MG tablet Commonly known as: AVAPRO Take 1 tablet (150 mg total) by mouth daily. What changed: when to take this   levalbuterol 1.25 MG/3ML nebulizer solution Commonly known as: XOPENEX USE 1 VIAL VIA NEBULIZER EVERY 6 HOURS AS NEEDED FOR SHORTNESS OF BREATH OR WHEEZING What changed:  how much to take how to take this when to take this reasons to take this additional instructions   liver oil-zinc oxide 40 % ointment Commonly known as: DESITIN Apply 1 application topically as needed for irritation.   methimazole 5 MG tablet Commonly known as: TAPAZOLE Take 1 tablet (5 mg total) by mouth daily. What changed: when to take this  montelukast 10 MG tablet Commonly known as: SINGULAIR Take 1 tablet (10 mg total) by  mouth at bedtime. For asthma control.   nystatin 100000 UNIT/ML suspension Commonly known as: MYCOSTATIN 56m swish and swallow after Advair 2 times daily. What changed:  how much to take how to take this when to take this reasons to take this additional instructions   ondansetron 4 MG tablet Commonly known as: ZOFRAN Take 1 tablet (4 mg total) by mouth every 8 (eight) hours as needed for nausea.   ondansetron 8 MG disintegrating tablet Commonly known as: ZOFRAN-ODT Take 1 tablet (8 mg total) by mouth every 8 (eight) hours as needed for nausea or vomiting.   OneTouch Delica Plus [id number] USE TO CHECK BLOOD SUGAR UP TO TWICE DAILY   OneTouch Verio test strip Generic drug: glucose blood USE TO CHECK BLOOD SUGAR UP TO TWICE DAILY   OneTouch Verio w/Device Kit Use to check blood sugar up to 2 times a day   oxybutynin 10 MG 24 hr tablet Commonly known as: DITROPAN-XL Take 10 mg by mouth at bedtime.   pantoprazole 40 MG tablet Commonly known as: PROTONIX Take 1 (ONE) tablet by mouth every morning. What changed:  how much to take when to take this   PATADAY OP Place 1 drop into both eyes daily.   Polyethyl Glycol-Propyl Glycol 0.4-0.3 % Soln Place 1 drop into both eyes daily.   potassium chloride SA 20 MEQ tablet Commonly known as: KLOR-CON M Take 1 tablet (20 mEq total) by mouth as needed (Take one daily when you need to take a Furosemide).   PRESCRIPTION MEDICATION Inhale into the lungs at bedtime. CPAP   SALONPAS JET SPRAY EX Apply 1 spray topically daily as needed (knee pain).   Spiriva Respimat 1.25 MCG/ACT Aers Generic drug: Tiotropium Bromide Monohydrate Inhale 2 puffs into the lungs daily. What changed: when to take this   THERAWORX RELIEF EX Apply 1 spray topically daily as needed (muscle cramps).   tolterodine 4 MG 24 hr capsule Commonly known as: DETROL LA Take 4 mg by mouth at bedtime.   traMADol 50 MG tablet Commonly known as:  ULTRAM Take 1-2 tablets (50-100 mg total) by mouth every 6 (six) hours as needed for moderate pain or severe pain (50 mg for moderate, 100 mg for severe).   Trulance 3 MG Tabs Generic drug: Plecanatide Take 3 mg by mouth daily as needed (constipation).   TURMERIC-GINGER PO Take 2 tablets by mouth every morning. chewables   Xarelto 20 MG Tabs tablet Generic drug: rivaroxaban TAKE 1 TABLET(20 MG) BY MOUTH DAILY WITH SUPPER What changed: See the new instructions.               Discharge Care Instructions  (From admission, onward)           Start     Ordered   12/23/21 0000  Discharge wound care:       Comments: It is good for closed incisions and even open wounds to be washed every day.  Shower every day.  Short baths are fine.  Wash the incisions and wounds clean with soap & water.    You may leave closed incisions open to air if it is dry.   You may cover the incision with clean gauze & replace it after your daily shower for comfort.  DERMABOND:  You have purple skin glue (Dermabond) on your incision(s).  Leave them in place, and they will fall off on their  own like a scab in 2-3 weeks.  You may trim any edges that curl up with clean scissors.   12/23/21 0828            Follow-up Information     [facility], [facility]. Go on 01/11/2022.   Specialty: General Surgery Why: Follow up appointment with Malachi Pro, PA-C scheduled for 1:45 PM. Please arrive 30 min prior to appointment time and have ID and insurance card with you. Contact information: [address] [place] [address] [phone]         [name], MD. Schedule an appointment as soon as possible for a visit in 1 week(s).   Specialty: Family Medicine Contact information: [name] [address] [phone]         Sanda Klein, MD .   Specialty: Cardiology Contact information: [address] [name] [address] [phone]                 Allergies  Allergen Reactions   Dilantin [Phenytoin] Swelling    Other reaction(s): facial swelling   Latex Hives   Oxycodone Nausea And Vomiting and Nausea Only    Other reaction(s): Abdominal Pain, Vomiting   Penicillins Swelling    Has patient had a PCN reaction causing immediate rash, facial/tongue/throat swelling, SOB or lightheadedness with hypotension patient had a PCN reaction causing severe rash involving mucus membranes or skin necrosis: [id number]} Has patient had a PCN reaction that required hospitalization/No Has patient had a PCN reaction occurring within the last 10 years: No If all of the above answers are "NO", then may proceed with Cephalosporin use.    Other reaction(s): vomiting   Codeine Nausea Only    Tolerates tylenol with codeine   Hydrocodone Nausea And Vomiting   Nickel Hives and Rash   Pregabalin Other (See Comments)    Blurred vision. Other reaction(s): headaches/problem w/vision    You were cared for by a hospitalist during your hospital stay. If you have any questions about your discharge medications or the care you received while you were in the hospital after you are discharged, you can call the unit and asked to speak with the hospitalist on call if the hospitalist that took care of you is not available. Once you are discharged, your primary care physician will handle any further medical issues. Please note that no refills for any discharge medications will be authorized once you are discharged, as it is imperative that you return to your primary care physician (or establish a relationship with a primary care physician if you do not have one) for your aftercare needs so that they can reassess your need for medications and monitor your lab values.   Procedures/Studies: DG Cholangiogram Operative  Result Date: 12/20/2021 CLINICAL DATA:  Cholecystectomy for cholelithiasis and acute cholecystitis. EXAM: INTRAOPERATIVE CHOLANGIOGRAM  TECHNIQUE: Cholangiographic images from the C-arm fluoroscopic device were submitted for interpretation post-operatively. Please see the procedural report for the amount of contrast and the fluoroscopy time utilized. FLUOROSCOPY: Radiation Exposure Index (as provided by the fluoroscopic device): 7.3 mGy Kerma COMPARISON:  Right upper quadrant ultrasound 12/18/2021 FINDINGS: Intraoperative imaging with a C-arm demonstrates normal opacified cystic duct stump, common bile duct and visualized intrahepatic ducts. No evidence of biliary dilatation, stricture, filling defect or contrast extravasation. Contrast enters the duodenum normally. IMPRESSION: Normal intraoperative cholangiogram. Electronically Signed   By: Aletta Edouard M.D.   On: 12/20/2021 12:41   US Abdomen Limited RUQ (LIVER/GB)  Result Date: 12/18/2021 CLINICAL DATA:  Abdominal pain EXAM: ULTRASOUND ABDOMEN LIMITED RIGHT UPPER QUADRANT COMPARISON:  Same day CT FINDINGS: Gallbladder: Distended gallbladder containing multiple stones, largest measuring up to 2.2 cm. Small amount of layering biliary sludge within the gallbladder. Gallbladder wall thickening of up to 4.4 mm. A positive sonographic Percell Miller sign was indicated by the technologist. Common bile duct: Diameter: 3 mm. Liver: No focal lesion identified. Within normal limits in parenchymal echogenicity. Portal vein is patent on color Doppler imaging with normal direction of blood flow towards the liver. Other: None. IMPRESSION: Sonographic features suggestive of acute calculus cholecystitis. Electronically Signed   By: Davina Poke D.O.   On: 12/18/2021 17:07   CT ABDOMEN PELVIS W CONTRAST  Result Date: 12/18/2021 CLINICAL DATA:  Abdominal pain, nausea, vomiting EXAM: CT ABDOMEN AND PELVIS WITH CONTRAST TECHNIQUE: Multidetector CT imaging of the abdomen and pelvis was performed using the standard protocol following bolus administration of intravenous contrast. RADIATION DOSE REDUCTION: This exam  was performed according to the departmental dose-optimization program which includes automated exposure control, adjustment of the mA and/or kV according to patient size and/or use of iterative reconstruction technique. CONTRAST:  123m OMNIPAQUE IOHEXOL 300 MG/ML  SOLN COMPARISON:  11/26/2016 FINDINGS: Lower chest: Heart is enlarged in size. No focal infiltrates are seen in the visualized lower lung fields. Hepatobiliary: No focal abnormality is seen in the liver. There are multiple gallbladder stones. Gallbladder is distended. There is no significant wall thickening in gallbladder. Pancreas: No focal abnormality is seen. Spleen: Unremarkable. Adrenals/Urinary Tract: Adrenals are unremarkable. There is no hydronephrosis. There is 2.5 cm cyst in the lower pole of right kidney. There are no renal or ureteral stones. Urinary bladder is distended. Length of the bladder is proximally 17.6 cm with dome of the bladder at the level of umbilicus. There is no wall thickening in the bladder. Stomach/Bowel: Stomach is unremarkable. Small bowel loops not dilated. Appendix is not distinctly seen. There is no pericecal inflammation. There is no significant wall thickening in the colon. Vascular/Lymphatic: Unremarkable. Reproductive: Uterus is not seen. There are multiple dense calcifications in the right side of pelvis. There is no definite associated soft tissue mass. This finding has not changed significantly. These may suggest calcified lymph nodes or calcifications in the right adnexa. Other: There is no ascites or pneumoperitoneum. Small umbilical hernia containing fat is seen. Musculoskeletal: There is marked sclerosis in the bodies of L4, L5 and S1 vertebrae. Irregularity is seen in the endplates of L4, L5 and S1 vertebrae. There is no definite demonstrable fluid collection in the paraspinal region. Schmorl's node is seen in the lower endplate of body of L1 vertebra. Degenerative changes are noted in the visualized lower  thoracic spine. IMPRESSION: There is no evidence intestinal obstruction or pneumoperitoneum. There is no hydronephrosis. Multiple gallbladder stones. Gallbladder is distended. If there are focal symptoms in the right upper quadrant follow-up gallbladder sonogram may be considered. There is interval appearance of marked sclerosis in the L4, L5 and S1 vertebrae along with irregularities in the endplates adjacent to the disc spaces. Possibility of chronic discitis is not excluded. Please correlate with clinical symptoms and consider follow-up MRI. There is marked distention of the urinary bladder with upper margin of dome of the bladder at the level of umbilicus. Other findings as described in the body of the report. Electronically Signed   By: PElmer PickerM.D.   On: 12/18/2021 16:10     The results of significant diagnostics from this  hospitalization (including imaging, microbiology, ancillary and laboratory) are listed below for reference.     Microbiology: No results found for this or any previous visit (from the past 240 hour(s)).   Labs: BNP (last 3 results) No results for input(s): "BNP" in the last 8760 hours. Basic Metabolic Panel: Recent Labs  Lab 12/19/21 0526 12/20/21 0442 12/21/21 0512 12/22/21 0511 12/23/21 0544  NA 139 144 139 141 140  K 4.1 4.5 4.6 4.1 3.8  CL 107 113* 109 110 111  CO2 25 20* _0 GLUCOSE 136* 94 203* 127* 89  BUN _1 CREATININE 1.00 0.90 0.98 0.79 0.66  CALCIUM 8.6* 8.9 8.8* 8.8* 8.5*  MG 2.4 2.4 2.4 2.3 1.9   Liver Function Tests: Recent Labs  Lab 12/18/21 1425 12/19/21 0526 12/20/21 0442  AST 22 25 33  ALT _2 ALKPHOS 74 80 90  BILITOT 0.7 1.6* 2.4*  PROT 6.3* 6.4* 6.4*  ALBUMIN 3.4* 3.5 3.1*   Recent Labs  Lab 12/18/21 1425  LIPASE 23   No results for input(s): "AMMONIA" in the last 168 hours. CBC: Recent Labs  Lab 12/19/21 0526 12/20/21 0442 12/21/21 0512 12/22/21 0511 12/23/21 0544  WBC 21.5*  29.5* 23.5* 15.9* 7.3  NEUTROABS 17.7* 24.8*  --   --   --   HGB 12.0 12.3 11.1* 11.9* 11.6*  HCT 36.8 38.1 34.4* 36.6 34.8*  MCV 78.5* 79.7* 79.6* 77.7* 76.7*  PLT 288 276 265 336 284   Cardiac Enzymes: No results for input(s): "CKTOTAL", "CKMB", "CKMBINDEX", "TROPONINI" in the last 168 hours. BNP: Invalid input(s): "POCBNP" CBG: Recent Labs  Lab 12/22/21 0606 12/22/21 1144 12/22/21 1742 12/22/21 2353 12/23/21 0445  GLUCAP 126* 160* 122* 106* 95   D-Dimer No results for input(s): "DDIMER" in the last 72 hours. Hgb A1c No results for input(s): "HGBA1C" in the last 72 hours. Lipid Profile No results for input(s): "CHOL", "HDL", "LDLCALC", "TRIG", "CHOLHDL", "LDLDIRECT" in the last 72 hours. Thyroid function studies No results for input(s): "TSH", "T4TOTAL", "T3FREE", "THYROIDAB" in the last 72 hours.  Invalid input(s): "FREET3" Anemia work up No results for input(s): "VITAMINB12", "FOLATE", "FERRITIN", "TIBC", "IRON", "RETICCTPCT" in the last 72 hours. Urinalysis    Component Value Date/Time   COLORURINE STRAW (A) 12/18/2021 1551   APPEARANCEUR HAZY (A) 12/18/2021 1551   LABSPEC 1.009 12/18/2021 1551   PHURINE 8.0 12/18/2021 1551   GLUCOSEU >=500 (A) 12/18/2021 1551   HGBUR NEGATIVE 12/18/2021 1551   BILIRUBINUR NEGATIVE 12/18/2021 1551   BILIRUBINUR negative 11/26/2016 1211   KETONESUR NEGATIVE 12/18/2021 1551   PROTEINUR NEGATIVE 12/18/2021 1551   UROBILINOGEN 1.0 04/13/2019 1544   NITRITE NEGATIVE 12/18/2021 1551   LEUKOCYTESUR NEGATIVE 12/18/2021 1551   Sepsis Labs Recent Labs  Lab 12/20/21 0442 12/21/21 0512 12/22/21 0511 12/23/21 0544  WBC 29.5* 23.5* 15.9* 7.3   Microbiology No results found for this or any previous visit (from the past 240 hour(s)).   Time coordinating discharge:  I have spent 35 minutes face to face with the patient and on the ward discussing the patients care, assessment, plan and disposition with other care givers. >50% of the  time was devoted counseling the patient about the risks and benefits of treatment/Discharge disposition and coordinating care.   SIGNED:   Damita Lack, MD  [facility] 12/23/2021, 11:19 AM   If 7PM-7AM, please contact night-coverage

## 2021-12-23 NOTE — Plan of Care (Signed)

## 2021-12-23 NOTE — TOC CM/SW Note (Signed)
  Transition of Care [facility]) Screening Note   Patient Details  Name: [name] Date of Birth: 1946/05/08   Transition of Care [facility]) CM/SW Contact:    Ross Ludwig, LCSW Phone Number: 12/23/2021, 2:34 PM    Transition of Care Department [facility]) has reviewed patient and no TOC needs have been identified at this time. We will continue to monitor patient advancement through interdisciplinary progression rounds. If new patient transition needs arise, please place a TOC consult.

## 2021-12-25 ENCOUNTER — Telehealth: Payer: Self-pay | Admitting: Cardiovascular Disease

## 2021-12-25 ENCOUNTER — Telehealth: Payer: Self-pay

## 2021-12-25 NOTE — Telephone Encounter (Addendum)
Transition Care Management Unsuccessful Follow-up Telephone Call  Date of discharge and from where: [facility] 12-23-21 Dx: acute calculous cholecystitis   Attempts:  1st Attempt  Reason for unsuccessful TCM follow-up call:  Left voice message  Transition Care Management Follow-up Telephone Call Date of discharge and from where:TCM [place] 12-23-21 Dx: acute calculous cholecystitis  How have you been since you were released from the hospital? Doing good Any questions or concerns? No  Items Reviewed: Did the pt receive and understand the discharge instructions provided? Yes  Medications obtained and verified? Yes  Other? No  Any new allergies since your discharge? No  Dietary orders reviewed? Yes Do you have support at home? Yes   Home Care and Equipment/Supplies: Were home health services ordered? no If so, what is the name of the agency? na  Has the agency set up a time to come to the patient's home? not applicable Were any new equipment or medical supplies ordered?  No What is the name of the medical supply agency? na Were you able to get the supplies/equipment? not applicable Do you have any questions related to the use of the equipment or supplies? No  Functional Questionnaire: (I = Independent and D = Dependent) ADLs: I  Bathing/Dressing- I  Meal Prep- I  Eating- I  Maintaining continence- I  Transferring/Ambulation- I  Managing Meds- I  Follow up appointments reviewed:  PCP Hospital f/u appt confirmed? Yes  Scheduled to see Dr Diona Browner on 12-29-21 @ [facility] f/u appt confirmed? Yes  Scheduled to see Dr Larina Earthly on 12-28-21 @ 1055am. Are transportation arrangements needed? No  If their condition worsens, is the pt aware to call PCP or go to the Emergency Dept.? Yes Was the patient provided with contact information for the PCP's office or ED? Yes Was to pt encouraged to call back with questions or concerns? Yes

## 2021-12-25 NOTE — Telephone Encounter (Signed)
Patient has emergency surgery and dr hospital told her to f/u with her heart dr. Arville Price appt for 6/15 but would like for nurse to call to see if the appt is okay or should she see dr crotioru. Please advise

## 2021-12-25 NOTE — Telephone Encounter (Signed)
Patient call with questions regarding her follow up appointment post cholecystectomy. She wanted to know if it was ok to see Diona Browner PA verses Dr. Sallyanne Kuster. Advised patient this appropriate and confirmed date and time of appoint scheduled for 12/29/2021.

## 2021-12-25 NOTE — Telephone Encounter (Signed)
Patient returned phone call. °

## 2021-12-25 NOTE — Telephone Encounter (Signed)
Patient called the office today to let Dr. Neldon Mc know that she had gallbladder removal surgery on Wednesday, 6/7 and was just released Saturday, 6/10. She mentioned the surgeon request she speaks to her doctor. I asked her if she is having any issues after surgery and she mentioned "some" breathing issues. She is requesting to speak directly with Dr. Neldon Mc.   Best call back number -  [phone]

## 2021-12-25 NOTE — Telephone Encounter (Signed)
Patient is coming in at 8:30am in [place] tomorrow, 6/13.

## 2021-12-26 ENCOUNTER — Ambulatory Visit: Payer: Medicare Other | Admitting: Adult Health

## 2021-12-26 ENCOUNTER — Ambulatory Visit (INDEPENDENT_AMBULATORY_CARE_PROVIDER_SITE_OTHER): Payer: Medicare Other | Admitting: Allergy and Immunology

## 2021-12-26 ENCOUNTER — Encounter: Payer: Self-pay | Admitting: Allergy and Immunology

## 2021-12-26 VITALS — BP 108/70 | HR 70 | Temp 97.3°F | Resp 16 | Ht 64.0 in | Wt 182.8 lb

## 2021-12-26 DIAGNOSIS — T781XXD Other adverse food reactions, not elsewhere classified, subsequent encounter: Secondary | ICD-10-CM

## 2021-12-26 DIAGNOSIS — K219 Gastro-esophageal reflux disease without esophagitis: Secondary | ICD-10-CM

## 2021-12-26 DIAGNOSIS — J4550 Severe persistent asthma, uncomplicated: Secondary | ICD-10-CM

## 2021-12-26 DIAGNOSIS — J3089 Other allergic rhinitis: Secondary | ICD-10-CM

## 2021-12-26 MED ORDER — FAMOTIDINE 40 MG PO TABS: ORAL_TABLET | ORAL | 1 refills | Status: DC

## 2021-12-26 MED ORDER — MONTELUKAST SODIUM 10 MG PO TABS: 10.0000 mg | ORAL_TABLET | Freq: Every day | ORAL | 1 refills | Status: DC

## 2021-12-26 MED ORDER — LEVALBUTEROL HCL 1.25 MG/3ML IN NEBU: INHALATION_SOLUTION | RESPIRATORY_TRACT | 1 refills | Status: DC

## 2021-12-26 MED ORDER — FLUTICASONE-SALMETEROL 230-21 MCG/ACT IN AERO
INHALATION_SPRAY | RESPIRATORY_TRACT | 5 refills | Status: DC
Start: 2021-12-26 — End: 2022-02-28

## 2021-12-26 MED ORDER — NYSTATIN 100000 UNIT/ML MT SUSP: OROMUCOSAL | 5 refills | Status: DC

## 2021-12-26 MED ORDER — PANTOPRAZOLE SODIUM 40 MG PO TBEC: DELAYED_RELEASE_TABLET | ORAL | 1 refills | Status: DC

## 2021-12-26 MED ORDER — IPRATROPIUM BROMIDE 0.06 % NA SOLN: NASAL | 5 refills | Status: DC

## 2021-12-26 MED ORDER — ALBUTEROL SULFATE HFA 108 (90 BASE) MCG/ACT IN AERS: INHALATION_SPRAY | RESPIRATORY_TRACT | 1 refills | Status: DC

## 2021-12-26 NOTE — Progress Notes (Unsigned)
[name] [facility] [place] [facility] [place]   Follow-up Note  Referring Provider: [name], MD Primary Provider: [name], MD Date of Office Visit: 12/26/2021  Subjective:   [name] (DOB: 10-07-1945) is a 76 y.o. female who returns to the [facility] [place] on 12/26/2021 in re-evaluation of the following:  HPI: [name] presents to this clinic in reevaluation of asthma, allergic rhinitis, LPR, adverse food reaction directed against shellfish and fish, history of pulmonary hypertension, history of diastolic dysfunction.  The new issue for [name] is the fact that she had a cholecystectomy performed on 20 December 2021 with good result.  She did have some postoperative diarrhea but fortunately that has resolved.  She does not really have that much pain and she is eating okay.  She has not had any breathing issues and has not had to use any albuterol.  She was changed from Advair and Spiriva to Symbicort and Incruse while in the hospital but she is now going back to her Advair and Spiriva.  Allergies as of 12/26/2021       Reactions   Dilantin [phenytoin] Swelling   Other reaction(s): facial swelling   Latex Hives   Oxycodone Nausea And Vomiting, Nausea Only   Other reaction(s): Abdominal Pain, Vomiting   Penicillins Swelling   Has patient had a PCN reaction causing immediate rash, facial/tongue/throat swelling, SOB or lightheadedness with hypotension patient had a PCN reaction causing severe rash involving mucus membranes or skin necrosis: [id number] Has patient had a PCN reaction that required hospitalization/No Has patient had a PCN reaction occurring within the last 10 years: No If all of the above answers are "NO", then may proceed with Cephalosporin use. Other reaction(s): vomiting   Codeine Nausea Only   Tolerates tylenol with codeine   Hydrocodone Nausea And Vomiting   Nickel Hives, Rash   Pregabalin Other (See Comments)   Blurred  vision. Other reaction(s): headaches/problem w/vision        Medication List    albuterol 108 (90 Base) MCG/ACT inhaler Commonly known as: VENTOLIN HFA INHALE 2 PUFFS BY MOUTH EVERY 6 HOURS AS NEEDED FOR WHEEZING OR SHORTNESS OF BREATH   atorvastatin 10 MG tablet Commonly known as: LIPITOR TAKE 1 TABLET(10 MG) BY MOUTH DAILY   DAIRY-RELIEF PO Take 1 capsule by mouth daily as needed (eating dairy).   EPINEPHrine 0.3 mg/0.3 mL Soaj injection Commonly known as: EPI-PEN USE AS DIRECTED FOR LIFE THREATENING ALLERGIC REACTIONS   famotidine 40 MG tablet Commonly known as: PEPCID Take 1 (ONE) tablet by mouth every evening.   fluticasone 220 MCG/ACT inhaler Commonly known as: FLOVENT HFA 2 puffs 2 times daily during Flare Up.   fluticasone-salmeterol 230-21 MCG/ACT inhaler Commonly known as: Advair HFA INHALE 2 PUFFS BY MOUTH TWICE DAILY TO PREVENT COUGH OR WHEEZING. RINSE MOUTH AFTER USE   furosemide 20 MG tablet Commonly known as: LASIX Take 20 mg as needed for a weight gain of 3 pounds overnight or 5 pounds in one week,.   gabapentin 600 MG tablet Commonly known as: NEURONTIN TAKE 1 TABLET BY MOUTH EVERY DAY AT BREAKFAST, 1 TABLET AT LUNCH AND 2 TABLETS AT BEDTIME   Glyxambi 10-5 MG Tabs Generic drug: Empagliflozin-linaGLIPtin TAKE 1 TABLET BY MOUTH DAILY What changed: when to take this   hydrOXYzine 10 MG tablet Commonly known as: ATARAX Take 1 tablet (10 mg total) by mouth daily as needed for itching.   ipratropium 0.06 % nasal spray  Commonly known as: ATROVENT 1-2 sprays in each nostril 1-2 times er day as needed to dry nose   irbesartan 150 MG tablet Commonly known as: AVAPRO Take 1 tablet (150 mg total) by mouth daily.   levalbuterol 1.25 MG/3ML nebulizer solution Commonly known as: XOPENEX USE 1 VIAL VIA NEBULIZER EVERY 6 HOURS AS NEEDED FOR SHORTNESS OF BREATH OR WHEEZING   liver oil-zinc oxide 40 % ointment Commonly known as: DESITIN Apply 1  application topically as needed for irritation.   methimazole 5 MG tablet Commonly known as: TAPAZOLE Take 1 tablet (5 mg total) by mouth daily.   montelukast 10 MG tablet Commonly known as: SINGULAIR Take 1 tablet (10 mg total) by mouth at bedtime. For asthma control.   nystatin 100000 UNIT/ML suspension Commonly known as: MYCOSTATIN 47m swish and swallow after Advair 2 times daily.   ondansetron 4 MG tablet Commonly known as: ZOFRAN Take 1 tablet (4 mg total) by mouth every 8 (eight) hours as needed for nausea.   ondansetron 8 MG disintegrating tablet Commonly known as: ZOFRAN-ODT Take 1 tablet (8 mg total) by mouth every 8 (eight) hours as needed for nausea or vomiting.   OneTouch Delica Plus [id number] USE TO CHECK BLOOD SUGAR UP TO TWICE DAILY   OneTouch Verio test strip Generic drug: glucose blood USE TO CHECK BLOOD SUGAR UP TO TWICE DAILY   OneTouch Verio w/Device Kit Use to check blood sugar up to 2 times a day   oxybutynin 10 MG 24 hr tablet Commonly known as: DITROPAN-XL Take 10 mg by mouth at bedtime.   pantoprazole 40 MG tablet Commonly known as: PROTONIX Take 1 (ONE) tablet by mouth every morning.   PATADAY OP Place 1 drop into both eyes daily.   Polyethyl Glycol-Propyl Glycol 0.4-0.3 % Soln Place 1 drop into both eyes daily.   potassium chloride SA 20 MEQ tablet Commonly known as: KLOR-CON M Take 1 tablet (20 mEq total) by mouth as needed (Take one daily when you need to take a Furosemide).   PRESCRIPTION MEDICATION Inhale into the lungs at bedtime. CPAP   SALONPAS JET SPRAY EX Apply 1 spray topically daily as needed (knee pain).   Spiriva Respimat 1.25 MCG/ACT Aers Generic drug: Tiotropium Bromide Monohydrate Inhale 2 puffs into the lungs daily. What changed: when to take this   THERAWORX RELIEF EX Apply 1 spray topically daily as needed (muscle cramps).   tolterodine 4 MG 24 hr capsule Commonly known as: DETROL LA Take 4 mg by  mouth at bedtime.   traMADol 50 MG tablet Commonly known as: ULTRAM Take 1-2 tablets (50-100 mg total) by mouth every 6 (six) hours as needed for moderate pain or severe pain (50 mg for moderate, 100 mg for severe).   Trulance 3 MG Tabs Generic drug: Plecanatide Take 3 mg by mouth daily as needed (constipation).   TURMERIC-GINGER PO Take 2 tablets by mouth every morning. chewables   Xarelto 20 MG Tabs tablet Generic drug: rivaroxaban TAKE 1 TABLET(20 MG) BY MOUTH DAILY WITH SUPPER What changed: See the new instructions.     Past Medical History:  Diagnosis Date   Allergic rhinitis    Allergy    Arthritis    Asthma    Chronic headache    Diabetes mellitus    DVT (deep venous thrombosis) (HCC)    Dyspnea    Heart murmur    Hypertension    Penicillin allergy 01/19/2020   Pulmonary embolism ([facility]    Pulmonary  hypertension ([place])    Seizures ([place])     Past Surgical History:  Procedure Laterality Date   ABDOMINAL HYSTERECTOMY     partial, has ovaries   CARDIAC CATHETERIZATION  12/28/2010   Mod. pulmonary hypertension, normal coronary arteries   CARDIOVERSION N/A 11/23/2020   Procedure: CARDIOVERSION;  Surgeon: Sanda Klein, MD;  Location: [place];  Service: Cardiovascular;  Laterality: N/A;   CHOLECYSTECTOMY N/A 12/20/2021   Procedure: LAPAROSCOPIC CHOLECYSTECTOMY WITH INTRAOPERATIVE CHOLANGIOGRAM;  Surgeon: Armandina Gemma, MD;  Location: [facility];  Service: General;  Laterality: N/A;   DOPPLER ECHOCARDIOGRAPHY  10/08/2011   EF=>55%,mild asymmetric LVH, mod. TR, mod. PH, mild to mod LA dilatation   IR LUMBAR DISC ASPIRATION W/IMG GUIDE  01/12/2020   KNEE ARTHROSCOPY Left    KNEE SURGERY     Nuclear Stress Test  05/20/2006   No ischemia   PARTIAL HYSTERECTOMY     PLANTAR FASCIA SURGERY     TONSILLECTOMY      Review of systems negative except as noted in HPI / PMHx or noted below:  Review of Systems  Constitutional: Negative.   HENT: Negative.    Eyes: Negative.    Respiratory: Negative.    Cardiovascular: Negative.   Gastrointestinal: Negative.   Genitourinary: Negative.   Musculoskeletal: Negative.   Skin: Negative.   Neurological: Negative.   Endo/Heme/Allergies: Negative.   Psychiatric/Behavioral: Negative.       Objective:   Vitals:   12/26/21 0840  BP: 108/70  Pulse: 70  Resp: 16  Temp: (!) 97.3 F (36.3 C)  SpO2: 100%   Height: _0  (162.6 cm)  Weight: 182 lb 12.8 oz (82.9 kg)   Physical Exam Constitutional:      Appearance: She is not diaphoretic.  HENT:     Head: Normocephalic.     Right Ear: Tympanic membrane, ear canal and external ear normal.     Left Ear: Tympanic membrane, ear canal and external ear normal.     Nose: Nose normal. No mucosal edema or rhinorrhea.     Mouth/Throat:     Pharynx: Uvula midline. No oropharyngeal exudate.  Eyes:     Conjunctiva/sclera: Conjunctivae normal.  Neck:     Thyroid: No thyromegaly.     Trachea: Trachea normal. No tracheal tenderness or tracheal deviation.  Cardiovascular:     Rate and Rhythm: Normal rate and regular rhythm.     Heart sounds: S1 normal and S2 normal. Murmur (Systolic) heard.  Pulmonary:     Effort: No respiratory distress.     Breath sounds: Normal breath sounds. No stridor. No wheezing or rales.  Lymphadenopathy:     Head:     Right side of head: No tonsillar adenopathy.     Left side of head: No tonsillar adenopathy.     Cervical: No cervical adenopathy.  Skin:    Findings: No erythema or rash.     Nails: There is no clubbing.  Neurological:     Mental Status: She is alert.     Diagnostics: none  Assessment and Plan:   1. Asthma, severe persistent, well-controlled   2. Other allergic rhinitis   3. Adverse food reaction, subsequent encounter   4. LPRD (laryngopharyngeal reflux disease)     1. Continue Advair 230 - 2 inhalations 1-2 times a day depending on disease activity  2. Continue Spiriva 2.5 respimat - 1 inhalations 1 time per  day.  3. Continue Montelukast 35m - 1 tablet 1 time per day.  4. Continue  Xolair (and Epi-Pen)  5. Continue pantoprazole 96m in the AM and Famotidine 40 mg in the PM.    6. Continue OTC NASACORT - 1 spray each nostril 1 time per day    7. If needed:   A. Nasal saline several times per day  B. Mucinex DM two times per day  C. Ipratropium 0.06% -1-2 sprays each nostril 1-2 times per day to dry nose  D. Proventil HFA or albuterol nebulization  E. nystatin 5 mL swish and swallow after Advair 2 times a day  8. Add Flovent 220 - 2 inhalations 2 times a day during "FLARE UP"  9. Return to clinic in 6 months or earlier if there is a problem.  MYuriannaappears to be doing quite well and her cholecystectomy was very successful without any obvious postoperative complications at this point in time.  Her breathing is doing well and her reflux is doing well and I do not think I am going to change any of her therapy as noted above and we will just see her back in this clinic in approximately 6 months or earlier if there is a problem.  EAllena Katz MD Allergy / Immunology [place]

## 2021-12-26 NOTE — Patient Instructions (Signed)
  1. Continue Advair 230 - 2 inhalations 1-2 times a day depending on disease activity  2. Continue Spiriva 2.5 respimat - 1 inhalations 1 time per day.  3. Continue Montelukast '10mg'$  - 1 tablet 1 time per day.  4. Continue Xolair (and Epi-Pen)  5. Continue pantoprazole '40mg'$  in the AM and Famotidine 40 mg in the PM.    6. Continue OTC NASACORT - 1 spray each nostril 1 time per day    7. If needed:   A. Nasal saline several times per day  B. Mucinex DM two times per day  C. Ipratropium 0.06% -1-2 sprays each nostril 1-2 times per day to dry nose  D. Proventil HFA or albuterol nebulization  E. nystatin 5 mL swish and swallow after Advair 2 times a day  8. Add Flovent 220 - 2 inhalations 2 times a day during "FLARE UP"  9. Return to clinic in 6 months or earlier if there is a problem.

## 2021-12-27 ENCOUNTER — Encounter: Payer: Self-pay | Admitting: Allergy and Immunology

## 2021-12-27 NOTE — Progress Notes (Signed)
Office Visit    Patient Name: [name] Date of Encounter: 12/28/2021  Primary Care Provider:  [name], MD Primary Cardiologist:  Sanda Klein, MD  Chief Complaint    76 year old female with a history of atrial flutter s/p DCCV in 11/2020, paroxysmal atrial fibrillation, chronic diastolic heart failure, remote DVT/PE, moderate pulmonary hypertension, hypertension, hyperlipidemia, OSA on CPAP, type 2 diabetes, hyperthyroidism, chronic diarrhea, chronic reactive airway disease, DJD and seizures who presents for follow-up related to atrial fibrillation/atrial flutter.  Past Medical History    Past Medical History:  Diagnosis Date   Allergic rhinitis    Allergy    Arthritis    Asthma    Chronic headache    Diabetes mellitus    DVT (deep venous thrombosis) (HCC)    Dyspnea    Heart murmur    Hypertension    Penicillin allergy 01/19/2020   Pulmonary embolism (HCC)    Pulmonary hypertension (HCC)    Seizures ([place])    Past Surgical History:  Procedure Laterality Date   ABDOMINAL HYSTERECTOMY     partial, has ovaries   CARDIAC CATHETERIZATION  12/28/2010   Mod. pulmonary hypertension, normal coronary arteries   CARDIOVERSION N/A 11/23/2020   Procedure: CARDIOVERSION;  Surgeon: Sanda Klein, MD;  Location: [facility];  Service: Cardiovascular;  Laterality: N/A;   CHOLECYSTECTOMY N/A 12/20/2021   Procedure: LAPAROSCOPIC CHOLECYSTECTOMY WITH INTRAOPERATIVE CHOLANGIOGRAM;  Surgeon: Armandina Gemma, MD;  Location: [facility];  Service: General;  Laterality: N/A;   DOPPLER ECHOCARDIOGRAPHY  10/08/2011   EF=>55%,mild asymmetric LVH, mod. TR, mod. PH, mild to mod LA dilatation   IR LUMBAR DISC ASPIRATION W/IMG GUIDE  01/12/2020   KNEE ARTHROSCOPY Left    KNEE SURGERY     Nuclear Stress Test  05/20/2006   No ischemia   PARTIAL HYSTERECTOMY     PLANTAR FASCIA SURGERY     TONSILLECTOMY      Allergies  Allergies  Allergen Reactions   Dilantin [Phenytoin] Swelling    Other  reaction(s): facial swelling   Latex Hives   Oxycodone Nausea And Vomiting and Nausea Only    Other reaction(s): Abdominal Pain, Vomiting   Penicillins Swelling    Has patient had a PCN reaction causing immediate rash, facial/tongue/throat swelling, SOB or lightheadedness with hypotension patient had a PCN reaction causing severe rash involving mucus membranes or skin necrosis: [id number]} Has patient had a PCN reaction that required hospitalization/No Has patient had a PCN reaction occurring within the last 10 years: No If all of the above answers are "NO", then may proceed with Cephalosporin use.    Other reaction(s): vomiting   Codeine Nausea Only    Tolerates tylenol with codeine   Hydrocodone Nausea And Vomiting   Nickel Hives and Rash   Pregabalin Other (See Comments)    Blurred vision. Other reaction(s): headaches/problem w/vision    History of Present Illness    76 year old female with the above past medical history including  atrial flutter s/p DCCV in 11/2020, paroxysmal atrial fibrillation, chronic diastolic heart failure, remote DVT/PE, moderate pulmonary hypertension, hypertension, hyperlipidemia, OSA on CPAP, type 2 diabetes, hyperthyroidism, chronic diarrhea, chronic reactive airway disease, DJD and seizures who presents for follow-up related to atrial fibrillation/atrial flutter.  Previous coronary angiography showed no evidence of CAD though she did have moderate pulmonary artery hypertension RHC (systolic PAP 50 mmHg). Lexiscan Myoview in 2019 was low risk, no evidence of ischemia.  Remote history of atrial fibrillation.  Echocardiogram in April 2022 showed  EF 60 to 65%, moderate asymmetric LVH of the basal-septal segment, indeterminate diastolic parameters mildly elevated PASP.  She was noted to be in atrial flutter at the time and underwent DCCV in May 2022.  She was last seen in the office on 06/23/2021 and was stable from a cardiac standpoint.   She was hospitalized  from 12/18/2021 to 12/23/2021 in the setting of acute calculus cholecystitis with sepsis.  She underwent laparoscopic cholecystectomy on 12/20/2021.  She tolerated the procedure well.  Her hospital course was uncomplicated.  Xarelto was resumed.  She was discharged home in stable condition on 12/23/2021.  She presents today for follow-up accompanied by her neighbor.  Since her last visit she has been stable from a cardiac standpoint.  She notes some mild dyspnea on exertion, she denies chest pain, edema, PND, orthopnea, denies palpitations.  She is slowly recovering from knee surgery as well as her recent gallbladder surgery but is encouraged by her progress. Overall, she reports feeling well and denies any new concerns today.  Home Medications    Current Outpatient Medications  Medication Sig Dispense Refill   albuterol (VENTOLIN HFA) 108 (90 Base) MCG/ACT inhaler INHALE 2 PUFFS BY MOUTH EVERY 6 HOURS AS NEEDED FOR WHEEZING OR SHORTNESS OF BREATH 18 g 1   atorvastatin (LIPITOR) 10 MG tablet TAKE 1 TABLET(10 MG) BY MOUTH DAILY (Patient taking differently: Take 10 mg by mouth daily.) 90 tablet 3   Blood Glucose Monitoring Suppl (ONETOUCH VERIO) w/Device KIT Use to check blood sugar up to 2 times a day 1 kit 0   EPINEPHrine 0.3 mg/0.3 mL IJ SOAJ injection USE AS DIRECTED FOR LIFE THREATENING ALLERGIC REACTIONS (Patient taking differently: Inject 0.3 mg into the muscle once as needed for anaphylaxis (life threatening allergic reactions).) 2 each 2   famotidine (PEPCID) 40 MG tablet Take 1 (ONE) tablet by mouth every evening. 90 tablet 1   fluticasone (FLOVENT HFA) 220 MCG/ACT inhaler 2 puffs 2 times daily during Flare Up. (Patient taking differently: Inhale 2 puffs into the lungs 2 (two) times daily as needed (shortness of breath/wheezing).) 12 g 5   fluticasone-salmeterol (ADVAIR HFA) 230-21 MCG/ACT inhaler 2 inhalations 1-2 times a day depending on disease activity.  RINSE MOUTH AFTER USE 12 g 5   furosemide  (LASIX) 20 MG tablet Take 20 mg as needed for a weight gain of 3 pounds overnight or 5 pounds in one week,. 30 tablet 3   gabapentin (NEURONTIN) 600 MG tablet TAKE 1 TABLET BY MOUTH EVERY DAY AT BREAKFAST, 1 TABLET AT LUNCH AND 2 TABLETS AT BEDTIME (Patient taking differently: Take 600-1,200 mg by mouth See admin instructions. Take one tablet (600 mg) by mouth with breakfast and lunch, take two tablets (1200 mg) at night) 360 tablet 1   GLYXAMBI 10-5 MG TABS TAKE 1 TABLET BY MOUTH DAILY (Patient taking differently: Take 1 tablet by mouth every morning.) 30 tablet 5   Homeopathic Products (THERAWORX RELIEF EX) Apply 1 spray topically daily as needed (muscle cramps).     hydrOXYzine (ATARAX/VISTARIL) 10 MG tablet Take 1 tablet (10 mg total) by mouth daily as needed for itching. 30 tablet 2   ipratropium (ATROVENT) 0.06 % nasal spray 1-2 sprays in each nostril 1-2 times per day as needed to dry nose 15 mL 5   irbesartan (AVAPRO) 150 MG tablet Take 1 tablet (150 mg total) by mouth daily. (Patient taking differently: Take 150 mg by mouth every morning.) 90 tablet 2   Lactase (DAIRY-RELIEF PO)  Take 1 capsule by mouth daily as needed (eating dairy).      Lancets (ONETOUCH DELICA PLUS [id number]) MISC USE TO CHECK BLOOD SUGAR UP TO TWICE DAILY 100 each 5   levalbuterol (XOPENEX) 1.25 MG/3ML nebulizer solution USE 1 VIAL VIA NEBULIZER EVERY 6 HOURS AS NEEDED FOR SHORTNESS OF BREATH OR WHEEZING 900 mL 1   liver oil-zinc oxide (DESITIN) 40 % ointment Apply 1 application topically as needed for irritation.     Menthol-Methyl Salicylate (SALONPAS JET SPRAY EX) Apply 1 spray topically daily as needed (knee pain).     methimazole (TAPAZOLE) 5 MG tablet Take 1 tablet (5 mg total) by mouth daily. (Patient taking differently: Take 5 mg by mouth every morning.) 90 tablet 3   montelukast (SINGULAIR) 10 MG tablet Take 1 tablet (10 mg total) by mouth at bedtime. For asthma control. 90 tablet 1   nystatin (MYCOSTATIN) 100000  UNIT/ML suspension 55m swish and swallow after Advair 2 times daily. 473 mL 5   Olopatadine HCl (PATADAY OP) Place 1 drop into both eyes daily.     ondansetron (ZOFRAN) 4 MG tablet Take 1 tablet (4 mg total) by mouth every 8 (eight) hours as needed for nausea. 8 tablet 2   ondansetron (ZOFRAN-ODT) 8 MG disintegrating tablet Take 1 tablet (8 mg total) by mouth every 8 (eight) hours as needed for nausea or vomiting. 20 tablet 0   ONETOUCH VERIO test strip USE TO CHECK BLOOD SUGAR UP TO TWICE DAILY 100 strip 5   oxybutynin (DITROPAN-XL) 10 MG 24 hr tablet Take 10 mg by mouth at bedtime.     pantoprazole (PROTONIX) 40 MG tablet Take 1 (ONE) tablet by mouth every morning. 90 tablet 1   Plecanatide (TRULANCE) 3 MG TABS Take 3 mg by mouth daily as needed (constipation).     Polyethyl Glycol-Propyl Glycol 0.4-0.3 % SOLN Place 1 drop into both eyes daily.     potassium chloride SA (KLOR-CON) 20 MEQ tablet Take 1 tablet (20 mEq total) by mouth as needed (Take one daily when you need to take a Furosemide). 30 tablet 0   PRESCRIPTION MEDICATION Inhale into the lungs at bedtime. CPAP     Tiotropium Bromide Monohydrate (SPIRIVA RESPIMAT) 1.25 MCG/ACT AERS Inhale 2 puffs into the lungs daily. (Patient taking differently: Inhale 2 puffs into the lungs every morning.) 4 g 5   tolterodine (DETROL LA) 4 MG 24 hr capsule Take 4 mg by mouth at bedtime.     traMADol (ULTRAM) 50 MG tablet Take 1-2 tablets (50-100 mg total) by mouth every 6 (six) hours as needed for moderate pain or severe pain (50 mg for moderate, 100 mg for severe). 20 tablet 0   TURMERIC-GINGER PO Take 2 tablets by mouth every morning. chewables     XARELTO 20 MG TABS tablet TAKE 1 TABLET(20 MG) BY MOUTH DAILY WITH SUPPER (Patient taking differently: 20 mg daily after supper.) 90 tablet 1   Current Facility-Administered Medications  Medication Dose Route Frequency Provider Last Rate Last Admin   omalizumab (Arvid Right injection 300 mg  300 mg  Subcutaneous Q28 days Kozlow, EDonnamarie Poag MD   300 mg at 12/12/21 1042     Review of Systems    She denies chest pain, palpitations, pnd, orthopnea, n, v, dizziness, syncope, edema, weight gain, or early satiety. All other systems reviewed and are otherwise negative except as noted above.   Physical Exam    VS:  BP 110/60 (BP Location: Right Arm)   Pulse  68   Ht '5\' 4"'  (1.626 m)   Wt 182 lb 12.8 oz (82.9 kg)   SpO2 100%   BMI 31.38 kg/m   GEN: Well nourished, well developed, in no acute distress. HEENT: normal. Neck: Supple, no JVD, carotid bruits, or masses. Cardiac: RRR, no murmurs, rubs, or gallops. No clubbing, cyanosis, edema.  Radials/DP/PT 2+ and equal bilaterally.  Respiratory:  Respirations regular and unlabored, clear to auscultation bilaterally. GI: Soft, nontender, nondistended, BS + x 4. MS: no deformity or atrophy. Skin: warm and dry, no rash. Neuro:  Strength and sensation are intact. Psych: Normal affect.  Accessory Clinical Findings    ECG personally reviewed by me today - No EKG in office today.  Lab Results  Component Value Date   WBC 7.3 12/23/2021   HGB 11.6 (L) 12/23/2021   HCT 34.8 (L) 12/23/2021   MCV 76.7 (L) 12/23/2021   PLT 284 12/23/2021   Lab Results  Component Value Date   CREATININE 0.66 12/23/2021   BUN 13 12/23/2021   NA 140 12/23/2021   K 3.8 12/23/2021   CL 111 12/23/2021   CO2 23 12/23/2021   Lab Results  Component Value Date   ALT 12 12/20/2021   AST 33 12/20/2021   ALKPHOS 90 12/20/2021   BILITOT 2.4 (H) 12/20/2021   Lab Results  Component Value Date   CHOL 120 04/27/2021   HDL 61.00 04/27/2021   LDLCALC 49 04/27/2021   TRIG 49.0 04/27/2021   CHOLHDL 2 04/27/2021    Lab Results  Component Value Date   HGBA1C 5.3 11/03/2021    Assessment & Plan   1. Atypical atrial flutter/paroxysmal atrial fibrillation: S/p DCCV in 11/2020. RRR on auscultation today.  Denies palpitations.  She has discussed options for future  treatment with Dr. Sallyanne Kuster including either antiarrhythmic or cavotricuspid isthmus ablation. She has a remote history of atrial fibrillation so CBI ablation would have limited benefit and would not allow for discontinuation of anticoagulants. Continue Xarelto.  2. Chronic diastolic heart failure:  She has noted some mild dyspnea on exertion, particularly when wearing a mask. She attributes her dyspnea to overall physical deconditioning as she recently underwent knee surgery and gallbladder surgery.  Denies edema, PND, orthopnea. Euvolemic and well compensated on exam. Weight is stable. Continue Lasix as needed.  3. Hypertension: BP well controlled. Continue current antihypertensive regimen.   4. Hyperlipidemia: LDL was 49 in October 2023.  Continue Lipitor.  5. Pulmonary hypertension: Thought to be multifactorial related to diastolic dysfunction, previous pulmonary embolism, longstanding obesity. Mild most recent echo, with estimated PASP of 44 mmHg.  She does have mild dyspnea on exertion, however, this is likely in the setting of overall physical deconditioning as she has had 2 surgeries in the past 3 months.  6. H/O DVT/PE: No recurrence.  She is on chronic anticoagulation given history of atrial arrhythmias.  7. Type 2 diabetes: A1C was 5.7 in October 2020.  Monitor managed per PCP.  8. OSA: On CPAP.  No concerns today.  9. Chronic asthma: Avoid carvedilol around the nonselective beta-blockers.  Follows with Dr. Neldon Mc.  10. Hyperthyroidism: Controlled on chronic methimazole.  TSH was 0.13 in November 2022.   11. Disposition: Follow-up in 3-4 months with Dr. Sallyanne Kuster.   Lenna Sciara, NP 12/28/2021, 11:26 AM

## 2021-12-28 ENCOUNTER — Ambulatory Visit (INDEPENDENT_AMBULATORY_CARE_PROVIDER_SITE_OTHER): Payer: Medicare Other | Admitting: Nurse Practitioner

## 2021-12-28 ENCOUNTER — Encounter: Payer: Self-pay | Admitting: Nurse Practitioner

## 2021-12-28 VITALS — BP 110/60 | HR 68 | Ht 64.0 in | Wt 182.8 lb

## 2021-12-28 DIAGNOSIS — E782 Mixed hyperlipidemia: Secondary | ICD-10-CM | POA: Diagnosis not present

## 2021-12-28 DIAGNOSIS — I484 Atypical atrial flutter: Secondary | ICD-10-CM | POA: Diagnosis not present

## 2021-12-28 DIAGNOSIS — I480 Paroxysmal atrial fibrillation: Secondary | ICD-10-CM | POA: Diagnosis not present

## 2021-12-28 DIAGNOSIS — G4731 Primary central sleep apnea: Secondary | ICD-10-CM | POA: Diagnosis not present

## 2021-12-28 DIAGNOSIS — Z86711 Personal history of pulmonary embolism: Secondary | ICD-10-CM

## 2021-12-28 DIAGNOSIS — J4540 Moderate persistent asthma, uncomplicated: Secondary | ICD-10-CM

## 2021-12-28 DIAGNOSIS — I5032 Chronic diastolic (congestive) heart failure: Secondary | ICD-10-CM

## 2021-12-28 DIAGNOSIS — E0590 Thyrotoxicosis, unspecified without thyrotoxic crisis or storm: Secondary | ICD-10-CM | POA: Diagnosis not present

## 2021-12-28 DIAGNOSIS — E1140 Type 2 diabetes mellitus with diabetic neuropathy, unspecified: Secondary | ICD-10-CM

## 2021-12-28 DIAGNOSIS — G4739 Other sleep apnea: Secondary | ICD-10-CM

## 2021-12-28 DIAGNOSIS — I10 Essential (primary) hypertension: Secondary | ICD-10-CM | POA: Diagnosis not present

## 2021-12-28 DIAGNOSIS — I2721 Secondary pulmonary arterial hypertension: Secondary | ICD-10-CM

## 2021-12-28 NOTE — Patient Instructions (Signed)
Medication Instructions:  Your physician recommends that you continue on your current medications as directed. Please refer to the Current Medication list given to you today.  *If you need a refill on your cardiac medications before your next appointment, please call your pharmacy*  Follow-Up: At [facility], you and your health needs are our priority.  As part of our continuing mission to provide you with exceptional heart care, we have created designated Provider Care Teams.  These Care Teams include your primary Cardiologist (physician) and Advanced Practice Providers (APPs -  Physician Assistants and Nurse Practitioners) who all work together to provide you with the care you need, when you need it.  We recommend signing up for the patient portal called "MyChart".  Sign up information is provided on this After Visit Summary.  MyChart is used to connect with patients for Virtual Visits (Telemedicine).  Patients are able to view lab/test results, encounter notes, upcoming appointments, etc.  Non-urgent messages can be sent to your provider as well.   To learn more about what you can do with MyChart, go to [email or web address].    Your next appointment:   3-4 month(s)  The format for your next appointment:   In Person  Provider:   Sanda Klein, MD {    Important Information About Sugar

## 2021-12-29 ENCOUNTER — Ambulatory Visit (INDEPENDENT_AMBULATORY_CARE_PROVIDER_SITE_OTHER): Payer: Medicare Other | Admitting: Family Medicine

## 2021-12-29 VITALS — BP 90/50 | HR 67 | Temp 97.7°F | Ht 64.0 in | Wt 185.0 lb

## 2021-12-29 DIAGNOSIS — K5909 Other constipation: Secondary | ICD-10-CM

## 2021-12-29 DIAGNOSIS — Z9049 Acquired absence of other specified parts of digestive tract: Secondary | ICD-10-CM

## 2021-12-29 DIAGNOSIS — N898 Other specified noninflammatory disorders of vagina: Secondary | ICD-10-CM

## 2021-12-29 DIAGNOSIS — D649 Anemia, unspecified: Secondary | ICD-10-CM | POA: Insufficient documentation

## 2021-12-29 DIAGNOSIS — N949 Unspecified condition associated with female genital organs and menstrual cycle: Secondary | ICD-10-CM

## 2021-12-29 LAB — CBC WITH DIFFERENTIAL/PLATELET
Basophils Absolute: 0.1 10*3/uL (ref 0.0–0.1)
Basophils Relative: 1.1 % (ref 0.0–3.0)
Eosinophils Absolute: 0.1 10*3/uL (ref 0.0–0.7)
Eosinophils Relative: 1.7 % (ref 0.0–5.0)
HCT: 33 % — ABNORMAL LOW (ref 36.0–46.0)
Hemoglobin: 10.9 g/dL — ABNORMAL LOW (ref 12.0–15.0)
Lymphocytes Relative: 33 % (ref 12.0–46.0)
Lymphs Abs: 2.3 10*3/uL (ref 0.7–4.0)
MCHC: 33 g/dL (ref 30.0–36.0)
MCV: 77.7 fl — ABNORMAL LOW (ref 78.0–100.0)
Monocytes Absolute: 0.8 10*3/uL (ref 0.1–1.0)
Monocytes Relative: 11.8 % (ref 3.0–12.0)
Neutro Abs: 3.6 10*3/uL (ref 1.4–7.7)
Neutrophils Relative %: 52.4 % (ref 43.0–77.0)
Platelets: 425 10*3/uL — ABNORMAL HIGH (ref 150.0–400.0)
RBC: 4.25 Mil/uL (ref 3.87–5.11)
RDW: 17.6 % — ABNORMAL HIGH (ref 11.5–15.5)
WBC: 6.9 10*3/uL (ref 4.0–10.5)

## 2021-12-29 LAB — BASIC METABOLIC PANEL
BUN: 6 mg/dL (ref 6–23)
CO2: 29 mEq/L (ref 19–32)
Calcium: 8.8 mg/dL (ref 8.4–10.5)
Chloride: 104 mEq/L (ref 96–112)
Creatinine, Ser: 0.83 mg/dL (ref 0.40–1.20)
GFR: 68.96 mL/min (ref >60.00)
Glucose, Bld: 97 mg/dL (ref 70–99)
Potassium: 4.2 mEq/L (ref 3.5–5.1)
Sodium: 140 mEq/L (ref 135–145)

## 2021-12-29 MED ORDER — FLUCONAZOLE 150 MG PO TABS: 150.0000 mg | ORAL_TABLET | Freq: Once | ORAL | 0 refills | Status: AC

## 2021-12-29 MED ORDER — ESTRADIOL 0.1 MG/GM VA CREA: TOPICAL_CREAM | VAGINAL | 12 refills | Status: DC

## 2021-12-29 NOTE — Assessment & Plan Note (Signed)
Acute, current worsening symptoms from last vaginal exam.  Now with groin involvement, very itchy and erythematous.  Concerning for candidal infection given recent antibiotics in hospital.  I will begin treating her with fluconazole 150 mg p.o. x1.  Given previous symptoms with negative wet prep, I still do think atrophic vaginitis is likely cause of her frequent UTI and chronic vaginal dryness/irritation.  We will start a estradiol vaginal cream for this diagnosis.

## 2021-12-29 NOTE — Patient Instructions (Addendum)
Please stop at the lab to have labs drawn.  Keep up with water intake.  Continue miralax twice daily, but if not improving you can  use Trulance as you have in the past.  Take fluconazole for vaginal yeast infection, if issue not improving start vaginal estrogen cream.

## 2021-12-29 NOTE — Assessment & Plan Note (Signed)
Due for reevaluation given recent surgery

## 2021-12-29 NOTE — Progress Notes (Signed)
Patient ID: [name], female    DOB: 03-Jul-1946, 76 y.o.   MRN: [id number]  This visit was conducted in person.  BP (!) 90/50   Pulse 67   Temp 97.7 F (36.5 C) (Temporal)   Ht '5\' 4"'  (1.626 m)   Wt 185 lb (83.9 kg)   SpO2 98%   BMI 31.76 kg/m    CC:  Chief Complaint  Patient presents with   Hospitalization Follow-up   Rash    In peri area    Subjective:   HPI: [name] is a 76 y.o. female presenting on 12/29/2021 for Hospitalization Follow-up and Rash (In peri area)   Reviewed hospital note for acute cholecystitis ( admitted 12/18/2021) resulting in cholecystectomy 6/7 by Dr. Harlow Asa.  Recommendations for Outpatient Follow-up:  Follow up with PCP in 1-2 weeks Please obtain BMP/CBC in one week your next doctors visit.  Pain medication prescribed by general surgery team.  Follow-up to be arranged by their service Okay to resume Xarelto    No further antibiotics needed, drain has been removed. Has diarrhea in hospital Last BM 4 days ago.. small.   She is having issue with  constipation despite miralax  TID. Took milk on mg last night. Still no BM.  On tramadol 50 mg 1-2 tabs  as needed. Not taking reguallry.  Pain is well controlled. She is back to eating soliod goods today,keeping up with liquids.   Her vaginal irritation continue... very sore, no discharge.  Relevant past medical, surgical, family and social history reviewed and updated as indicated. Interim medical history since our last visit reviewed. Allergies and medications reviewed and updated. Outpatient Medications Prior to Visit  Medication Sig Dispense Refill   albuterol (VENTOLIN HFA) 108 (90 Base) MCG/ACT inhaler INHALE 2 PUFFS BY MOUTH EVERY 6 HOURS AS NEEDED FOR WHEEZING OR SHORTNESS OF BREATH 18 g 1   atorvastatin (LIPITOR) 10 MG tablet TAKE 1 TABLET(10 MG) BY MOUTH DAILY (Patient taking differently: Take 10 mg by mouth daily.) 90 tablet 3   Blood Glucose Monitoring Suppl (ONETOUCH  VERIO) w/Device KIT Use to check blood sugar up to 2 times a day 1 kit 0   EPINEPHrine 0.3 mg/0.3 mL IJ SOAJ injection USE AS DIRECTED FOR LIFE THREATENING ALLERGIC REACTIONS (Patient taking differently: Inject 0.3 mg into the muscle once as needed for anaphylaxis (life threatening allergic reactions).) 2 each 2   famotidine (PEPCID) 40 MG tablet Take 1 (ONE) tablet by mouth every evening. 90 tablet 1   fluticasone (FLOVENT HFA) 220 MCG/ACT inhaler 2 puffs 2 times daily during Flare Up. (Patient taking differently: Inhale 2 puffs into the lungs 2 (two) times daily as needed (shortness of breath/wheezing).) 12 g 5   fluticasone-salmeterol (ADVAIR HFA) 230-21 MCG/ACT inhaler 2 inhalations 1-2 times a day depending on disease activity.  RINSE MOUTH AFTER USE 12 g 5   furosemide (LASIX) 20 MG tablet Take 20 mg as needed for a weight gain of 3 pounds overnight or 5 pounds in one week,. 30 tablet 3   gabapentin (NEURONTIN) 600 MG tablet TAKE 1 TABLET BY MOUTH EVERY DAY AT BREAKFAST, 1 TABLET AT LUNCH AND 2 TABLETS AT BEDTIME (Patient taking differently: Take 600-1,200 mg by mouth See admin instructions. Take one tablet (600 mg) by mouth with breakfast and lunch, take two tablets (1200 mg) at night) 360 tablet 1   GLYXAMBI 10-5 MG TABS TAKE 1 TABLET BY MOUTH DAILY (Patient taking differently: Take 1 tablet by  mouth every morning.) 30 tablet 5   Homeopathic Products (THERAWORX RELIEF EX) Apply 1 spray topically daily as needed (muscle cramps).     hydrOXYzine (ATARAX/VISTARIL) 10 MG tablet Take 1 tablet (10 mg total) by mouth daily as needed for itching. 30 tablet 2   ipratropium (ATROVENT) 0.06 % nasal spray 1-2 sprays in each nostril 1-2 times per day as needed to dry nose 15 mL 5   irbesartan (AVAPRO) 150 MG tablet Take 1 tablet (150 mg total) by mouth daily. (Patient taking differently: Take 150 mg by mouth every morning.) 90 tablet 2   Lactase (DAIRY-RELIEF PO) Take 1 capsule by mouth daily as needed  (eating dairy).      Lancets (ONETOUCH DELICA PLUS [id number]) MISC USE TO CHECK BLOOD SUGAR UP TO TWICE DAILY 100 each 5   levalbuterol (XOPENEX) 1.25 MG/3ML nebulizer solution USE 1 VIAL VIA NEBULIZER EVERY 6 HOURS AS NEEDED FOR SHORTNESS OF BREATH OR WHEEZING 900 mL 1   liver oil-zinc oxide (DESITIN) 40 % ointment Apply 1 application topically as needed for irritation.     Menthol-Methyl Salicylate (SALONPAS JET SPRAY EX) Apply 1 spray topically daily as needed (knee pain).     methimazole (TAPAZOLE) 5 MG tablet Take 1 tablet (5 mg total) by mouth daily. (Patient taking differently: Take 5 mg by mouth every morning.) 90 tablet 3   montelukast (SINGULAIR) 10 MG tablet Take 1 tablet (10 mg total) by mouth at bedtime. For asthma control. 90 tablet 1   nystatin (MYCOSTATIN) 100000 UNIT/ML suspension 83m swish and swallow after Advair 2 times daily. 473 mL 5   Olopatadine HCl (PATADAY OP) Place 1 drop into both eyes daily.     ondansetron (ZOFRAN) 4 MG tablet Take 1 tablet (4 mg total) by mouth every 8 (eight) hours as needed for nausea. 8 tablet 2   ondansetron (ZOFRAN-ODT) 8 MG disintegrating tablet Take 1 tablet (8 mg total) by mouth every 8 (eight) hours as needed for nausea or vomiting. 20 tablet 0   ONETOUCH VERIO test strip USE TO CHECK BLOOD SUGAR UP TO TWICE DAILY 100 strip 5   oxybutynin (DITROPAN-XL) 10 MG 24 hr tablet Take 10 mg by mouth at bedtime.     pantoprazole (PROTONIX) 40 MG tablet Take 1 (ONE) tablet by mouth every morning. 90 tablet 1   Plecanatide (TRULANCE) 3 MG TABS Take 3 mg by mouth daily as needed (constipation).     Polyethyl Glycol-Propyl Glycol 0.4-0.3 % SOLN Place 1 drop into both eyes daily.     potassium chloride SA (KLOR-CON) 20 MEQ tablet Take 1 tablet (20 mEq total) by mouth as needed (Take one daily when you need to take a Furosemide). 30 tablet 0   PRESCRIPTION MEDICATION Inhale into the lungs at bedtime. CPAP     Tiotropium Bromide Monohydrate (SPIRIVA RESPIMAT)  1.25 MCG/ACT AERS Inhale 2 puffs into the lungs daily. (Patient taking differently: Inhale 2 puffs into the lungs every morning.) 4 g 5   tolterodine (DETROL LA) 4 MG 24 hr capsule Take 4 mg by mouth at bedtime.     traMADol (ULTRAM) 50 MG tablet Take 1-2 tablets (50-100 mg total) by mouth every 6 (six) hours as needed for moderate pain or severe pain (50 mg for moderate, 100 mg for severe). 20 tablet 0   TURMERIC-GINGER PO Take 2 tablets by mouth every morning. chewables     XARELTO 20 MG TABS tablet TAKE 1 TABLET(20 MG) BY MOUTH DAILY WITH SUPPER (Patient  taking differently: 20 mg daily after supper.) 90 tablet 1   Facility-Administered Medications Prior to Visit  Medication Dose Route Frequency Provider Last Rate Last Admin   omalizumab Arvid Right) injection 300 mg  300 mg Subcutaneous Q28 days Kozlow, Donnamarie Poag, MD   300 mg at 12/12/21 1042     Per HPI unless specifically indicated in ROS section below Review of Systems Objective:  BP (!) 90/50   Pulse 67   Temp 97.7 F (36.5 C) (Temporal)   Ht '5\' 4"'  (1.626 m)   Wt 185 lb (83.9 kg)   SpO2 98%   BMI 31.76 kg/m   Wt Readings from Last 3 Encounters:  12/29/21 185 lb (83.9 kg)  12/28/21 182 lb 12.8 oz (82.9 kg)  12/26/21 182 lb 12.8 oz (82.9 kg)      Physical Exam Constitutional:      General: She is not in acute distress.    Appearance: Normal appearance. She is well-developed. She is not ill-appearing or toxic-appearing.  HENT:     Head: Normocephalic.     Right Ear: Hearing, tympanic membrane, ear canal and external ear normal. Tympanic membrane is not erythematous, retracted or bulging.     Left Ear: Hearing, tympanic membrane, ear canal and external ear normal. Tympanic membrane is not erythematous, retracted or bulging.     Nose: No mucosal edema or rhinorrhea.     Right Sinus: No maxillary sinus tenderness or frontal sinus tenderness.     Left Sinus: No maxillary sinus tenderness or frontal sinus tenderness.     Mouth/Throat:      Pharynx: Uvula midline.  Eyes:     General: Lids are normal. Lids are everted, no foreign bodies appreciated.     Conjunctiva/sclera: Conjunctivae normal.     Pupils: Pupils are equal, round, and reactive to light.  Neck:     Thyroid: No thyroid mass or thyromegaly.     Vascular: No carotid bruit.     Trachea: Trachea normal.  Cardiovascular:     Rate and Rhythm: Normal rate and regular rhythm.     Pulses: Normal pulses.     Heart sounds: Normal heart sounds, S1 normal and S2 normal. No murmur heard.    No friction rub. No gallop.  Pulmonary:     Effort: Pulmonary effort is normal. No tachypnea or respiratory distress.     Breath sounds: Normal breath sounds. No decreased breath sounds, wheezing, rhonchi or rales.  Abdominal:     General: Bowel sounds are normal.     Palpations: Abdomen is soft.     Tenderness: There is no abdominal tenderness.  Genitourinary:    Pubic Area: Rash present.     Labia:        Right: Rash present. No tenderness, lesion or injury.        Left: Rash present. No tenderness, lesion or injury.      Comments: No discharge  Erythema in groin folds as well as external vaginal area. Musculoskeletal:     Cervical back: Normal range of motion and neck supple.  Skin:    General: Skin is warm and dry.     Findings: No rash.  Neurological:     Mental Status: She is alert.  Psychiatric:        Mood and Affect: Mood is not anxious or depressed.        Speech: Speech normal.        Behavior: Behavior normal. Behavior is cooperative.  Thought Content: Thought content normal.        Judgment: Judgment normal.       Results for orders placed or performed during the hospital encounter of 12/18/21  Lipase, blood  Result Value Ref Range   Lipase 23 11 - 51 U/L  Comprehensive metabolic panel  Result Value Ref Range   Sodium 142 135 - 145 mmol/L   Potassium 3.8 3.5 - 5.1 mmol/L   Chloride 112 (H) 98 - 111 mmol/L   CO2 24 22 - 32 mmol/L   Glucose,  Bld 121 (H) 70 - 99 mg/dL   BUN 12 8 - 23 mg/dL   Creatinine, Ser 0.84 0.44 - 1.00 mg/dL   Calcium 8.1 (L) 8.9 - 10.3 mg/dL   Total Protein 6.3 (L) 6.5 - 8.1 g/dL   Albumin 3.4 (L) 3.5 - 5.0 g/dL   AST 22 15 - 41 U/L   ALT 9 0 - 44 U/L   Alkaline Phosphatase 74 38 - 126 U/L   Total Bilirubin 0.7 0.3 - 1.2 mg/dL   GFR, Estimated >60 >60 mL/min   Anion gap 6 5 - 15  CBC  Result Value Ref Range   WBC 7.5 4.0 - 10.5 K/uL   RBC 4.59 3.87 - 5.11 MIL/uL   Hemoglobin 11.9 (L) 12.0 - 15.0 g/dL   HCT 36.1 36.0 - 46.0 %   MCV 78.6 (L) 80.0 - 100.0 fL   MCH 25.9 (L) 26.0 - 34.0 pg   MCHC 33.0 30.0 - 36.0 g/dL   RDW 16.6 (H) 11.5 - 15.5 %   Platelets 221 150 - 400 K/uL   nRBC 0.0 0.0 - 0.2 %  Urinalysis, Routine w reflex microscopic Urine, Clean Catch  Result Value Ref Range   Color, Urine STRAW (A) YELLOW   APPearance HAZY (A) CLEAR   Specific Gravity, Urine 1.009 1.005 - 1.030   pH 8.0 5.0 - 8.0   Glucose, UA >=500 (A) NEGATIVE mg/dL   Hgb urine dipstick NEGATIVE NEGATIVE   Bilirubin Urine NEGATIVE NEGATIVE   Ketones, ur NEGATIVE NEGATIVE mg/dL   Protein, ur NEGATIVE NEGATIVE mg/dL   Nitrite NEGATIVE NEGATIVE   Leukocytes,Ua NEGATIVE NEGATIVE   RBC / HPF 0-5 0 - 5 RBC/hpf   WBC, UA 0-5 0 - 5 WBC/hpf   Bacteria, UA RARE (A) NONE SEEN   Squamous Epithelial / LPF 0-5 0 - 5   Mucus PRESENT   CBC with Differential/Platelet  Result Value Ref Range   WBC 21.5 (H) 4.0 - 10.5 K/uL   RBC 4.69 3.87 - 5.11 MIL/uL   Hemoglobin 12.0 12.0 - 15.0 g/dL   HCT 36.8 36.0 - 46.0 %   MCV 78.5 (L) 80.0 - 100.0 fL   MCH 25.6 (L) 26.0 - 34.0 pg   MCHC 32.6 30.0 - 36.0 g/dL   RDW 16.8 (H) 11.5 - 15.5 %   Platelets 288 150 - 400 K/uL   nRBC 0.0 0.0 - 0.2 %   Neutrophils Relative % 83 %   Neutro Abs 17.7 (H) 1.7 - 7.7 K/uL   Lymphocytes Relative 9 %   Lymphs Abs 2.0 0.7 - 4.0 K/uL   Monocytes Relative 7 %   Monocytes Absolute 1.6 (H) 0.1 - 1.0 K/uL   Eosinophils Relative 0 %   Eosinophils  Absolute 0.1 0.0 - 0.5 K/uL   Basophils Relative 0 %   Basophils Absolute 0.1 0.0 - 0.1 K/uL   Immature Granulocytes 1 %   Abs Immature Granulocytes  0.12 (H) 0.00 - 0.07 K/uL  Comprehensive metabolic panel  Result Value Ref Range   Sodium 139 135 - 145 mmol/L   Potassium 4.1 3.5 - 5.1 mmol/L   Chloride 107 98 - 111 mmol/L   CO2 25 22 - 32 mmol/L   Glucose, Bld 136 (H) 70 - 99 mg/dL   BUN 12 8 - 23 mg/dL   Creatinine, Ser 1.00 0.44 - 1.00 mg/dL   Calcium 8.6 (L) 8.9 - 10.3 mg/dL   Total Protein 6.4 (L) 6.5 - 8.1 g/dL   Albumin 3.5 3.5 - 5.0 g/dL   AST 25 15 - 41 U/L   ALT 13 0 - 44 U/L   Alkaline Phosphatase 80 38 - 126 U/L   Total Bilirubin 1.6 (H) 0.3 - 1.2 mg/dL   GFR, Estimated 59 (L) >60 mL/min   Anion gap 7 5 - 15  Magnesium  Result Value Ref Range   Magnesium 2.1 1.7 - 2.4 mg/dL  Magnesium  Result Value Ref Range   Magnesium 2.4 1.7 - 2.4 mg/dL  Protime-INR  Result Value Ref Range   Prothrombin Time 20.4 (H) 11.4 - 15.2 seconds   INR 1.8 (H) 0.8 - 1.2  Glucose, capillary  Result Value Ref Range   Glucose-Capillary 127 (H) 70 - 99 mg/dL  Glucose, capillary  Result Value Ref Range   Glucose-Capillary 132 (H) 70 - 99 mg/dL  CBC with Differential/Platelet  Result Value Ref Range   WBC 29.5 (H) 4.0 - 10.5 K/uL   RBC 4.78 3.87 - 5.11 MIL/uL   Hemoglobin 12.3 12.0 - 15.0 g/dL   HCT 38.1 36.0 - 46.0 %   MCV 79.7 (L) 80.0 - 100.0 fL   MCH 25.7 (L) 26.0 - 34.0 pg   MCHC 32.3 30.0 - 36.0 g/dL   RDW 17.0 (H) 11.5 - 15.5 %   Platelets 276 150 - 400 K/uL   nRBC 0.0 0.0 - 0.2 %   Neutrophils Relative % 85 %   Neutro Abs 24.8 (H) 1.7 - 7.7 K/uL   Lymphocytes Relative 5 %   Lymphs Abs 1.6 0.7 - 4.0 K/uL   Monocytes Relative 6 %   Monocytes Absolute 1.7 (H) 0.1 - 1.0 K/uL   Eosinophils Relative 0 %   Eosinophils Absolute 0.0 0.0 - 0.5 K/uL   Basophils Relative 0 %   Basophils Absolute 0.1 0.0 - 0.1 K/uL   WBC Morphology VACUOLATED NEUTROPHILS    Immature Granulocytes  4 %   Abs Immature Granulocytes 1.24 (H) 0.00 - 0.07 K/uL   Reactive, Benign Lymphocytes PRESENT    Burr Cells PRESENT   Comprehensive metabolic panel  Result Value Ref Range   Sodium 144 135 - 145 mmol/L   Potassium 4.5 3.5 - 5.1 mmol/L   Chloride 113 (H) 98 - 111 mmol/L   CO2 20 (L) 22 - 32 mmol/L   Glucose, Bld 94 70 - 99 mg/dL   BUN 12 8 - 23 mg/dL   Creatinine, Ser 0.90 0.44 - 1.00 mg/dL   Calcium 8.9 8.9 - 10.3 mg/dL   Total Protein 6.4 (L) 6.5 - 8.1 g/dL   Albumin 3.1 (L) 3.5 - 5.0 g/dL   AST 33 15 - 41 U/L   ALT 12 0 - 44 U/L   Alkaline Phosphatase 90 38 - 126 U/L   Total Bilirubin 2.4 (H) 0.3 - 1.2 mg/dL   GFR, Estimated >60 >60 mL/min   Anion gap 11 5 - 15  Magnesium  Result  Value Ref Range   Magnesium 2.4 1.7 - 2.4 mg/dL  Glucose, capillary  Result Value Ref Range   Glucose-Capillary 139 (H) 70 - 99 mg/dL  Glucose, capillary  Result Value Ref Range   Glucose-Capillary 99 70 - 99 mg/dL  Glucose, capillary  Result Value Ref Range   Glucose-Capillary 113 (H) 70 - 99 mg/dL  Glucose, capillary  Result Value Ref Range   Glucose-Capillary 109 (H) 70 - 99 mg/dL  Glucose, capillary  Result Value Ref Range   Glucose-Capillary 158 (H) 70 - 99 mg/dL  Basic metabolic panel  Result Value Ref Range   Sodium 139 135 - 145 mmol/L   Potassium 4.6 3.5 - 5.1 mmol/L   Chloride 109 98 - 111 mmol/L   CO2 23 22 - 32 mmol/L   Glucose, Bld 203 (H) 70 - 99 mg/dL   BUN 19 8 - 23 mg/dL   Creatinine, Ser 0.98 0.44 - 1.00 mg/dL   Calcium 8.8 (L) 8.9 - 10.3 mg/dL   GFR, Estimated >60 >60 mL/min   Anion gap 7 5 - 15  CBC  Result Value Ref Range   WBC 23.5 (H) 4.0 - 10.5 K/uL   RBC 4.32 3.87 - 5.11 MIL/uL   Hemoglobin 11.1 (L) 12.0 - 15.0 g/dL   HCT 34.4 (L) 36.0 - 46.0 %   MCV 79.6 (L) 80.0 - 100.0 fL   MCH 25.7 (L) 26.0 - 34.0 pg   MCHC 32.3 30.0 - 36.0 g/dL   RDW 16.9 (H) 11.5 - 15.5 %   Platelets 265 150 - 400 K/uL   nRBC 0.0 0.0 - 0.2 %  Magnesium  Result Value Ref  Range   Magnesium 2.4 1.7 - 2.4 mg/dL  Glucose, capillary  Result Value Ref Range   Glucose-Capillary 257 (H) 70 - 99 mg/dL  Glucose, capillary  Result Value Ref Range   Glucose-Capillary 175 (H) 70 - 99 mg/dL  Glucose, capillary  Result Value Ref Range   Glucose-Capillary 213 (H) 70 - 99 mg/dL  Basic metabolic panel  Result Value Ref Range   Sodium 141 135 - 145 mmol/L   Potassium 4.1 3.5 - 5.1 mmol/L   Chloride 110 98 - 111 mmol/L   CO2 23 22 - 32 mmol/L   Glucose, Bld 127 (H) 70 - 99 mg/dL   BUN 20 8 - 23 mg/dL   Creatinine, Ser 0.79 0.44 - 1.00 mg/dL   Calcium 8.8 (L) 8.9 - 10.3 mg/dL   GFR, Estimated >60 >60 mL/min   Anion gap 8 5 - 15  CBC  Result Value Ref Range   WBC 15.9 (H) 4.0 - 10.5 K/uL   RBC 4.71 3.87 - 5.11 MIL/uL   Hemoglobin 11.9 (L) 12.0 - 15.0 g/dL   HCT 36.6 36.0 - 46.0 %   MCV 77.7 (L) 80.0 - 100.0 fL   MCH 25.3 (L) 26.0 - 34.0 pg   MCHC 32.5 30.0 - 36.0 g/dL   RDW 16.6 (H) 11.5 - 15.5 %   Platelets 336 150 - 400 K/uL   nRBC 0.1 0.0 - 0.2 %  Magnesium  Result Value Ref Range   Magnesium 2.3 1.7 - 2.4 mg/dL  Glucose, capillary  Result Value Ref Range   Glucose-Capillary 158 (H) 70 - 99 mg/dL  Glucose, capillary  Result Value Ref Range   Glucose-Capillary 152 (H) 70 - 99 mg/dL  Glucose, capillary  Result Value Ref Range   Glucose-Capillary 126 (H) 70 - 99 mg/dL  Glucose, capillary  Result Value Ref Range   Glucose-Capillary 160 (H) 70 - 99 mg/dL  Basic metabolic panel  Result Value Ref Range   Sodium 140 135 - 145 mmol/L   Potassium 3.8 3.5 - 5.1 mmol/L   Chloride 111 98 - 111 mmol/L   CO2 23 22 - 32 mmol/L   Glucose, Bld 89 70 - 99 mg/dL   BUN 13 8 - 23 mg/dL   Creatinine, Ser 0.66 0.44 - 1.00 mg/dL   Calcium 8.5 (L) 8.9 - 10.3 mg/dL   GFR, Estimated >60 >60 mL/min   Anion gap 6 5 - 15  CBC  Result Value Ref Range   WBC 7.3 4.0 - 10.5 K/uL   RBC 4.54 3.87 - 5.11 MIL/uL   Hemoglobin 11.6 (L) 12.0 - 15.0 g/dL   HCT 34.8 (L) 36.0 -  46.0 %   MCV 76.7 (L) 80.0 - 100.0 fL   MCH 25.6 (L) 26.0 - 34.0 pg   MCHC 33.3 30.0 - 36.0 g/dL   RDW 16.6 (H) 11.5 - 15.5 %   Platelets 284 150 - 400 K/uL   nRBC 0.5 (H) 0.0 - 0.2 %  Magnesium  Result Value Ref Range   Magnesium 1.9 1.7 - 2.4 mg/dL  Glucose, capillary  Result Value Ref Range   Glucose-Capillary 122 (H) 70 - 99 mg/dL  Glucose, capillary  Result Value Ref Range   Glucose-Capillary 106 (H) 70 - 99 mg/dL  Glucose, capillary  Result Value Ref Range   Glucose-Capillary 95 70 - 99 mg/dL  Glucose, capillary  Result Value Ref Range   Glucose-Capillary 118 (H) 70 - 99 mg/dL  CBG monitoring, ED  Result Value Ref Range   Glucose-Capillary 137 (H) 70 - 99 mg/dL   Comment 1 Notify RN    Comment 2 Document in Chart   Surgical pathology  Result Value Ref Range   SURGICAL PATHOLOGY      SURGICAL PATHOLOGY CASE: [id number] PATIENT: Nattaly Emmons Surgical Pathology Report     Clinical History: Acute cholecystitis (crm)     FINAL MICROSCOPIC DIAGNOSIS:  A. GALLBLADDER, CHOLECYSTECTOMY: - Acute calculous cholecystitis with necrosis.  No dysplasia or malignancy.  GROSS DESCRIPTION:  Size/?Intact: Received fresh is a minimally disrupted gallbladder measuring 12.4 x 5.0 x 2.9 cm Serosal surface: Pink-red and smooth to mildly roughened with ample amounts of attached adipose Mucosa/Wall: Green-gray, flat and, and with areas of hemorrhagic material without distinct lesions and a wall thickness of 0.3 cm Contents: Green viscous bile that is blood-tinged and 3 black-green, smooth, round calculi-each measuring 2.0 cm Cystic duct: Probed and found to be patent Block Summary: The cystic duct margin and representative wall are submitted in 1 block. Amparo Bristol, 12/20/2021)     Final Diagnosis performed by Mark Martinique, MD.   Electronically signed 6/8/202 3 Technical component performed at [facility], [place] [address] [place], [place] [address].   Professional component performed at [employer], [place] [address], [place], [place] [address].  Immunohistochemistry Technical component (if applicable) was performed at [facility]. [address] [place], [place], [place] [address].   IMMUNOHISTOCHEMISTRY DISCLAIMER (if applicable): Some of these immunohistochemical stains may have been developed and the performance characteristics determine by [facility]. Some may not have been cleared or approved by the U.S. Food and Drug Administration. The FDA has determined that such clearance or approval is not necessary. This test is used for clinical purposes. It should not be regarded as investigational  or for research. This laboratory is certified under the [employer] (CLIA-88) as qualified to perform high complexity clinical  laboratory testing.  The controls stained appropriately.    *Note: Due to a large number of results and/or encounters for the requested time period, some results have not been displayed. A complete set of results can be found in Results Review.     COVID 19 screen:  No recent travel or known exposure to COVID19 The patient denies respiratory symptoms of COVID 19 at this time. The importance of social distancing was discussed today.   Assessment and Plan Problem List Items Addressed This Visit     Anemia    Due for reevaluation given recent surgery      Relevant Orders   CBC with Differential/Platelet   Chronic constipation    Acute worsening of chronic issue  She has tried MiraLAX 3 times daily along with milk of magnesia.  She will continue this but has Trulance that she can use if no further bowel movement.  I encouraged her to increase her water intake, continue fiber in her diet and increase activity as able.      Vaginal burning    Acute, current worsening symptoms from last vaginal exam.  Now with groin  involvement, very itchy and erythematous.  Concerning for candidal infection given recent antibiotics in hospital.  I will begin treating her with fluconazole 150 mg p.o. x1.  Given previous symptoms with negative wet prep, I still do think atrophic vaginitis is likely cause of her frequent UTI and chronic vaginal dryness/irritation.  We will start a estradiol vaginal cream for this diagnosis.      Vaginal irritation   Other Visit Diagnoses     S/P cholecystectomy    -  Primary   Relevant Orders   Basic Metabolic Panel      Meds ordered this encounter  Medications   fluconazole (DIFLUCAN) 150 MG tablet    Sig: Take 1 tablet (150 mg total) by mouth once for 1 dose.    Dispense:  1 tablet    Refill:  0   estradiol (ESTRACE) 0.1 MG/GM vaginal cream    Sig: 0.5 g of cream intravaginally administered daily for 2 weeks, then reduce to twice weekly    Dispense:  42.5 g    Refill:  12    Orders Placed This Encounter  Procedures   CBC with Differential/Platelet   Basic Metabolic Panel       [name], MD

## 2021-12-29 NOTE — Assessment & Plan Note (Signed)
Acute worsening of chronic issue  She has tried MiraLAX 3 times daily along with milk of magnesia.  She will continue this but has Trulance that she can use if no further bowel movement.  I encouraged her to increase her water intake, continue fiber in her diet and increase activity as able.

## 2022-01-03 ENCOUNTER — Ambulatory Visit: Payer: Medicare Other | Admitting: Podiatry

## 2022-01-03 ENCOUNTER — Telehealth: Payer: Self-pay | Admitting: Cardiovascular Disease

## 2022-01-03 NOTE — Telephone Encounter (Signed)
Called pt to go over her medication. All questions answered. No further expressed at this time. She thanked me for calling her.

## 2022-01-03 NOTE — Telephone Encounter (Signed)
Pt c/o medication issue:  1. Name of Medication: irbesartan (AVAPRO) 150 MG tablet  2. How are you currently taking this medication (dosage and times per day)? As prescribed   3. Are you having a reaction (difficulty breathing--STAT)?   4. What is your medication issue?  She is wanting a call back to get clarification on how to take this medication after her emergency surgery recently. She would like to know if she should take it during day or night.

## 2022-01-08 ENCOUNTER — Telehealth: Payer: Self-pay | Admitting: Allergy and Immunology

## 2022-01-08 ENCOUNTER — Other Ambulatory Visit: Payer: Self-pay | Admitting: Podiatry

## 2022-01-08 DIAGNOSIS — J4550 Severe persistent asthma, uncomplicated: Secondary | ICD-10-CM | POA: Diagnosis not present

## 2022-01-09 ENCOUNTER — Ambulatory Visit (INDEPENDENT_AMBULATORY_CARE_PROVIDER_SITE_OTHER): Payer: Medicare Other

## 2022-01-09 DIAGNOSIS — J4550 Severe persistent asthma, uncomplicated: Secondary | ICD-10-CM

## 2022-01-10 NOTE — Telephone Encounter (Addendum)
PA has started for Pantoprazole Sodium 40 mg   Created: 01/10/22  KEY: [id number] - PA Case ID: [id number]   PA approved: 05./29/23 - 01/10/23

## 2022-01-22 ENCOUNTER — Telehealth: Payer: Self-pay

## 2022-01-22 NOTE — Progress Notes (Signed)
Chronic Care Management Pharmacy Assistant   Name: [name]  MRN: [id number] DOB: Nov 19, 1945  Reason for Encounter: CCM (General Adherence)  Recent office visits:  12/29/21 [name], MD S/P Cholecystectomy Abnormal Labs: "Metabolic panel is in the normal range. Hemoglobin is slightly lower than at hospital discharge.  I believe she has an upcoming surgical follow-up.  Make sure she has them review her hemoglobin level and they can consider rechecking at that time. White blood cell count is in normal range."  Start: ESTRACE 0.1 MG/GM vaginal cream Start: DIFLUCAN 150 MG 12/12/21 [name], MD Vaginal burning Stop (completed): VIBRAMYCIN 100 MG Start: Cortisone 10  11/03/21 [name], MD HTN Stop: ANTIVERT 12.5 MG Stop: DITROPAN-XL 10 MG Stop: DETROL LA 4 MG  08/15/21 [name], MD Concussion Advised: Cane or walker No med changes/ 08/01/21 [name], MD Chronic Pain Management No med changes  Recent consult visits:  01/11/22 Rolm Bookbinder, MD (Gen Surgery) Post Cholecystectomy No med changes 01/09/22 Salem Senate, LPN (Allergy) Administered: Arvid Right injection 300 mg 12/28/21 Diona Browner, NP (Cardiology) Atypical Atrial Flutter No med changes FU 3 - 4 months 12/26/21 Allena Katz, MD (Allergy) No med changes FU 6 months 12/25/21 Conni Slipper, RN (Gen Surgery) Telephone Diarrhea Pt instructed to start Metamucil  12/15/21 Viviana Simpler, MD Right ankle pain No med changes  12/14/21 Rhett Hallows, MD (Ortho) 9 weeks Post-op right knee joint FU 9 months 12/12/21 Allena Katz, MD (Allergy) Ordered: Spirometry Start: OTC Nasacort Start: Flovent 220 Stop: Acetaminophen 1,300 mg 12/06/21 Max Milinda Pointer, DPM (Podiatry) Bursitis of left foot  11/14/21 Salem Senate, LPN (Allergy) Administered: Arvid Right injection 300 mg 11/08/21 Podiatry telephone Start: Diflucan 150 mg tablet due to yeast infection 11/01/21 Max Hyatt, DPM (Podiatry) Bursitis of left foot  10/31/21 Philemon Kingdom, MD  (Internal Medicine) Graves Disease No med changes. FU 6 months 10/31/21 Acquanetta Sit, DPM (Podiatry) Pain Onychomycosis of toenail Ordered: DG Foot Left 10/30/21 Royann Shivers, PA (Ortho) Post-op right knee joint FU 3 - 5 weeks 10/621 Jennette Banker, MD (Nutrition) Telemedicine Unplanned weight loss  10/17/21 Salem Senate, LPN (Allergy) Administered: Arvid Right injection 300 mg 10/04/21 Max Hyatt, DPM (Podiatry) Diabetic Polyneuropathy FU 3 months 09/28/21 Jennette Banker, MD (Nutrition) Telemedicine Unplanned weight loss  09/25/21 Royann Shivers, PA (Orthopaedics) Right knee pain Pre-Op Knee Replacement 09/12/21 Clovis Cao, CMA (Allergy) Administered: Arvid Right injection 300 mg 09/06/21 Melida Quitter, MD (ENT) Hemoptysis Procedure: Fiberoptic exam of the Pharynx and Larynx (unremarkable) 08/28/21 Lavell Anchors (Neurosurgery) Intervertebral disc degeneration No med changes 08/15/21 Tally Joe, CMA (Allergy) Administered: Arvid Right injection 300 mg 08/10/21 [facility] - Referred to ED 08/08/21 Tyson Dense, DPM (Podiatry) Contracture of toe joint 08/01/21 Max Milinda Pointer, DPM (Podiatry) Contracture of toe joint  Hospital visits:  Medication Reconciliation was completed by comparing discharge summary, patient's EMR and Pharmacy list, and upon discussion with patient.  Admitted to the hospital on 12/19/21 due to pain, nausea and vomiting. Discharge date was 12/23/21. Discharged from [facility].    Final Diagnoses: Cholecystitis  New?Medications Started at [facility] Discharge:?? -started ondansetron (ZOFRAN) 4 MG tablet -started traMADol (ULTRAM) 50 MG tablet  Medication Changes at Hospital Discharge: No changes  Medications Discontinued at Hospital Discharge: -Stopped acetaminophen-codeine 300-30 MG tablet (TYLENOL #3) -Stopped cyclobenzaprine 5 MG tablet (FLEXERIL) -Stopped diclofenac Sodium 1 % Gel (Voltaren) -Stopped oxyCODONE-acetaminophen 5-325 MG tablet  (PERCOCET/ROXICET)  Medications that remain the same after Hospital Discharge:??  -All other medications will remain the same.  10/16/21 [facility] - Discharged same day Cellulitis - knee pain and swelling -Start Doxycycline Hyclate 100 mg -Start Ondansetron 8 mg -Start Oxycodone-Acetaminophen 5-325 mg  10/13/21 [name] - Discharged same day Visit for wound check per physical therapy - Final decision appears normal No med changes  10/10/21 [place] same day Right total Knee Arthroplasty -Start Acetaminophen 325 mg -Start Hydromorphone 2 mg  -Start Sennosides-docusate 8.6-50 mg -Stop Acetaminophen 650 mg -Stop Tapentadol 50 mg -Stop Tramadol 50 mg  08/12/21 [facility] - Discharged same day Headache and Emesis Procedures: Flu and Covid and CT Head Final Diagnoses: Post concussive syndrome and Adverse effect of drug  08/10/21 [facility] - Discharged same day Fall Procedures: CT Head, DG Lumbar Spine, DG Thoracic Spine, DG Pelvis Final Diagnoses: Fall, Acute nonintractable headache, Injury of head, Acute midline back pain Change - FLEXERIL 5 MG tablet  Medications: Outpatient Encounter Medications as of 01/22/2022  Medication Sig Note   albuterol (VENTOLIN HFA) 108 (90 Base) MCG/ACT inhaler INHALE 2 PUFFS BY MOUTH EVERY 6 HOURS AS NEEDED FOR WHEEZING OR SHORTNESS OF BREATH    atorvastatin (LIPITOR) 10 MG tablet TAKE 1 TABLET(10 MG) BY MOUTH DAILY (Patient taking differently: Take 10 mg by mouth daily.)    Blood Glucose Monitoring Suppl (ONETOUCH VERIO) w/Device KIT Use to check blood sugar up to 2 times a day    EPINEPHrine 0.3 mg/0.3 mL IJ SOAJ injection USE AS DIRECTED FOR LIFE THREATENING ALLERGIC REACTIONS (Patient taking differently: Inject 0.3 mg into the muscle once as needed for anaphylaxis (life threatening allergic reactions).)    estradiol (ESTRACE) 0.1 MG/GM vaginal cream 0.5 g of cream intravaginally administered  daily for 2 weeks, then reduce to twice weekly    famotidine (PEPCID) 40 MG tablet Take 1 (ONE) tablet by mouth every evening.    fluticasone (FLOVENT HFA) 220 MCG/ACT inhaler 2 puffs 2 times daily during Flare Up. (Patient taking differently: Inhale 2 puffs into the lungs 2 (two) times daily as needed (shortness of breath/wheezing).)    fluticasone-salmeterol (ADVAIR HFA) 230-21 MCG/ACT inhaler 2 inhalations 1-2 times a day depending on disease activity.  RINSE MOUTH AFTER USE    furosemide (LASIX) 20 MG tablet Take 20 mg as needed for a weight gain of 3 pounds overnight or 5 pounds in one week,.    gabapentin (NEURONTIN) 600 MG tablet TAKE 1 TABLET BY MOUTH EVERY DAY AT BREAKFAST, 1 TABLET AT LUNCH AND 2 TABLETS AT BEDTIME    GLYXAMBI 10-5 MG TABS TAKE 1 TABLET BY MOUTH DAILY (Patient taking differently: Take 1 tablet by mouth every morning.)    Homeopathic Products (THERAWORX RELIEF EX) Apply 1 spray topically daily as needed (muscle cramps).    hydrOXYzine (ATARAX/VISTARIL) 10 MG tablet Take 1 tablet (10 mg total) by mouth daily as needed for itching.    ipratropium (ATROVENT) 0.06 % nasal spray 1-2 sprays in each nostril 1-2 times per day as needed to dry nose    irbesartan (AVAPRO) 150 MG tablet Take 1 tablet (150 mg total) by mouth daily. (Patient taking differently: Take 150 mg by mouth every morning.)    Lactase (DAIRY-RELIEF PO) Take 1 capsule by mouth daily as needed (eating dairy).     Lancets (ONETOUCH DELICA PLUS [id number]) MISC USE TO CHECK BLOOD SUGAR UP TO TWICE DAILY    levalbuterol (XOPENEX) 1.25 MG/3ML nebulizer solution USE 1 VIAL VIA NEBULIZER EVERY 6 HOURS AS NEEDED FOR SHORTNESS OF BREATH  OR WHEEZING    liver oil-zinc oxide (DESITIN) 40 % ointment Apply 1 application topically as needed for irritation.    Menthol-Methyl Salicylate (SALONPAS JET SPRAY EX) Apply 1 spray topically daily as needed (knee pain).    methimazole (TAPAZOLE) 5 MG tablet Take 1 tablet (5 mg total) by  mouth daily. (Patient taking differently: Take 5 mg by mouth every morning.)    montelukast (SINGULAIR) 10 MG tablet Take 1 tablet (10 mg total) by mouth at bedtime. For asthma control.    nystatin (MYCOSTATIN) 100000 UNIT/ML suspension 24m swish and swallow after Advair 2 times daily.    Olopatadine HCl (PATADAY OP) Place 1 drop into both eyes daily.    ondansetron (ZOFRAN) 4 MG tablet Take 1 tablet (4 mg total) by mouth every 8 (eight) hours as needed for nausea.    ondansetron (ZOFRAN-ODT) 8 MG disintegrating tablet Take 1 tablet (8 mg total) by mouth every 8 (eight) hours as needed for nausea or vomiting.    ONETOUCH VERIO test strip USE TO CHECK BLOOD SUGAR UP TO TWICE DAILY    oxybutynin (DITROPAN-XL) 10 MG 24 hr tablet Take 10 mg by mouth at bedtime.    pantoprazole (PROTONIX) 40 MG tablet Take 1 (ONE) tablet by mouth every morning.    Plecanatide (TRULANCE) 3 MG TABS Take 3 mg by mouth daily as needed (constipation).    Polyethyl Glycol-Propyl Glycol 0.4-0.3 % SOLN Place 1 drop into both eyes daily.    potassium chloride SA (KLOR-CON) 20 MEQ tablet Take 1 tablet (20 mEq total) by mouth as needed (Take one daily when you need to take a Furosemide).    PRESCRIPTION MEDICATION Inhale into the lungs at bedtime. CPAP    Tiotropium Bromide Monohydrate (SPIRIVA RESPIMAT) 1.25 MCG/ACT AERS Inhale 2 puffs into the lungs daily. (Patient taking differently: Inhale 2 puffs into the lungs every morning.)    tolterodine (DETROL LA) 4 MG 24 hr capsule Take 4 mg by mouth at bedtime. 12/19/2021: Pt states she is taking both oxybutynin and tolterodine   traMADol (ULTRAM) 50 MG tablet Take 1-2 tablets (50-100 mg total) by mouth every 6 (six) hours as needed for moderate pain or severe pain (50 mg for moderate, 100 mg for severe).    TURMERIC-GINGER PO Take 2 tablets by mouth every morning. chewables    XARELTO 20 MG TABS tablet TAKE 1 TABLET(20 MG) BY MOUTH DAILY WITH SUPPER (Patient taking differently: 20 mg  daily after supper.)    Facility-Administered Encounter Medications as of 01/22/2022  Medication Note   omalizumab (Arvid Right injection 300 mg 12/19/2021: One time injection 12/12/21    [place] general disease state and medication adherence call.   Patient is not more than 5 days past due for refill on the following medications per chart history:  [facility] Medications: Medication Name/mg Last Fill Days Supply Atorvastatin 10 mg  11/26/21 90 Irbesartan 150 mg  01/01/22 90  What concerns do you have about your medications?  The patient denies side effects with their medications.   How often do you forget or accidentally miss a dose? Never  Do you use a pillbox? Yes  Are you having any problems getting your medications from your pharmacy? No  Has the cost of your medications been a concern? No  The patient has had an ED visit since last contact.   The patient reports the following problems with their health. Patient stated she has been taking fiber as advised by  [place] and now she is having loose stools. Patient is going to call [facility] today (as she has been in contact with them in regards to her stools) to see what she can do to get them to not be so watery. I advised patient if she needed anything from [place] to please call us as we would be happy to help her.   Patient denies concerns or questions for Charlene Brooke, PharmD at this time.   Care Gaps: Annual wellness visit in last year? Yes 04/27/21 Most Recent BP reading: 90/50 on 12/29/21  If Diabetic: Most recent A1C reading:  5.3 on 11/03/21 Last eye exam / retinopathy screening: Up to date Last diabetic foot exam: Up to date  Upcoming appointments: Diabetic foot care appointment 02/02/2022 CCM appointment on 03/27/2022 at 9:30  Charlene Brooke, [place] notified  Marijean Niemann, [place] Assistant [phone]

## 2022-01-26 NOTE — Assessment & Plan Note (Signed)
Acute  Minimal discharge.  We will send out for wet prep to rule out bacterial infection or yeast infection but I think this is less likely.  Changes are most likely secondary to wearing depends with contact dermatitis as well as potentially with postmenopausal vaginal atrophy.  Start topical steroid cream Cortisone 10 twice daily on irritated skin.

## 2022-01-29 ENCOUNTER — Other Ambulatory Visit: Payer: Self-pay | Admitting: Family Medicine

## 2022-01-29 DIAGNOSIS — E1140 Type 2 diabetes mellitus with diabetic neuropathy, unspecified: Secondary | ICD-10-CM

## 2022-02-02 ENCOUNTER — Encounter: Payer: Self-pay | Admitting: Podiatry

## 2022-02-02 ENCOUNTER — Ambulatory Visit (INDEPENDENT_AMBULATORY_CARE_PROVIDER_SITE_OTHER): Payer: Medicare Other | Admitting: Podiatry

## 2022-02-02 DIAGNOSIS — M79676 Pain in unspecified toe(s): Secondary | ICD-10-CM

## 2022-02-02 DIAGNOSIS — R141 Gas pain: Secondary | ICD-10-CM | POA: Insufficient documentation

## 2022-02-02 DIAGNOSIS — B351 Tinea unguium: Secondary | ICD-10-CM | POA: Diagnosis not present

## 2022-02-02 DIAGNOSIS — Q828 Other specified congenital malformations of skin: Secondary | ICD-10-CM | POA: Diagnosis not present

## 2022-02-02 DIAGNOSIS — J4550 Severe persistent asthma, uncomplicated: Secondary | ICD-10-CM | POA: Diagnosis not present

## 2022-02-02 DIAGNOSIS — Z1231 Encounter for screening mammogram for malignant neoplasm of breast: Secondary | ICD-10-CM | POA: Diagnosis not present

## 2022-02-02 DIAGNOSIS — E1142 Type 2 diabetes mellitus with diabetic polyneuropathy: Secondary | ICD-10-CM | POA: Diagnosis not present

## 2022-02-02 DIAGNOSIS — R143 Flatulence: Secondary | ICD-10-CM | POA: Insufficient documentation

## 2022-02-02 DIAGNOSIS — Z800 Family history of malignant neoplasm of digestive organs: Secondary | ICD-10-CM | POA: Insufficient documentation

## 2022-02-05 ENCOUNTER — Ambulatory Visit (INDEPENDENT_AMBULATORY_CARE_PROVIDER_SITE_OTHER): Payer: Medicare Other

## 2022-02-05 DIAGNOSIS — J4550 Severe persistent asthma, uncomplicated: Secondary | ICD-10-CM | POA: Diagnosis not present

## 2022-02-10 NOTE — Progress Notes (Signed)
Subjective:  Patient ID: [name], female    DOB: 1946/07/11,  MRN: [id number]  [name] presents to clinic today for at risk foot care with history of diabetic neuropathy and preulcerative lesion(s) b/l lower extremities and painful mycotic toenails that limit ambulation. Painful toenails interfere with ambulation. Aggravating factors include wearing enclosed shoe gear. Pain is relieved with periodic professional debridement. Painful porokeratotic lesions are aggravated when weightbearing with and without shoegear. Pain is relieved with periodic professional debridement.  Patient states blood glucose was 93 mg/dl yesterday.  Last A1c was 5.3%.  New problem(s): None.   PCP is [name], MD , and last visit was  December 29, 2021  Allergies  Allergen Reactions   Dilantin [Phenytoin] Swelling    Other reaction(s): facial swelling   Latex Hives   Oxycodone Nausea And Vomiting and Nausea Only    Other reaction(s): Abdominal Pain, Vomiting   Penicillins Swelling    Has patient had a PCN reaction causing immediate rash, facial/tongue/throat swelling, SOB or lightheadedness with hypotension patient had a PCN reaction causing severe rash involving mucus membranes or skin necrosis: [id number]} Has patient had a PCN reaction that required hospitalization/No Has patient had a PCN reaction occurring within the last 10 years: No If all of the above answers are "NO", then may proceed with Cephalosporin use.    Other reaction(s): vomiting   Codeine Nausea Only    Tolerates tylenol with codeine   Hydrocodone Nausea And Vomiting   Nickel Hives and Rash   Pregabalin Other (See Comments)    Blurred vision. Other reaction(s): headaches/problem w/vision    Review of Systems: Negative except as noted in the HPI.  Objective: No changes noted in today's physical examination.  General: Patient is a pleasant 76 y.o. African American female WD, WN in NAD. AAO x 3.   Neurovascular  Examination: CFT <3 seconds b/l LE. Palpable DP/PT pulses b/l LE. Digital hair sparse b/l. Skin temperature gradient WNL b/l. No pain with calf compression b/l. No edema noted b/l. No cyanosis or clubbing noted b/l LE.  Pt has subjective symptoms of neuropathy. Protective sensation decreased with 10 gram monofilament b/l.  Dermatological:  Pedal integument with normal turgor, texture and tone BLE. No open wounds b/l LE. No interdigital macerations noted b/l LE. Toenails 2-5 bilaterally elongated, discolored, dystrophic, thickened, and crumbly with subungual debris and tenderness to dorsal palpation. Anonychia noted bilateral great toes. Nailbed(s) epithelialized.  Porokeratotic lesion(s) submet head 2 left foot, distal tip right 3rd toe and submet head 5 b/l.  No erythema, no edema, no drainage, no fluctuance.  Musculoskeletal:  Normal muscle strength 5/5 to all lower extremity muscle groups bilaterally. Hammertoe deformity noted 2-5 b/l.      Latest Ref Rng & Units 11/03/2021    2:29 PM 04/27/2021   12:39 PM  Hemoglobin A1C  Hemoglobin-A1c 4.0 - 5.6 % 5.3  5.7    Assessment/Plan: 1. Pain due to onychomycosis of toenail   2. Porokeratosis   3. Diabetic polyneuropathy associated with type 2 diabetes mellitus (HCC)      -Examined patient. -Toenails 1-5 b/l were debrided in length and girth with sterile nail nippers and dremel without iatrogenic bleeding.  -Porokeratotic lesion(s) distal tip of right 3rd toe, submet head 2 left foot, and submet head 5 b/l pared and enucleated with sterile scalpel blade without incident. Total number of lesions debrided=4. -Patient/POA to call should there be question/concern in the interim.   Return in about 3 months (  around 05/05/2022).  Marzetta Board, DPM

## 2022-02-13 DIAGNOSIS — R928 Other abnormal and inconclusive findings on diagnostic imaging of breast: Secondary | ICD-10-CM | POA: Diagnosis not present

## 2022-02-13 DIAGNOSIS — R922 Inconclusive mammogram: Secondary | ICD-10-CM | POA: Diagnosis not present

## 2022-02-13 LAB — HM MAMMOGRAPHY

## 2022-02-14 ENCOUNTER — Telehealth: Payer: Self-pay | Admitting: Family Medicine

## 2022-02-14 MED ORDER — GLYXAMBI 10-5 MG PO TABS: 1.0000 | ORAL_TABLET | Freq: Every day | ORAL | 5 refills | Status: DC

## 2022-02-14 NOTE — Telephone Encounter (Signed)
Patient called and stated she needs approval for medication GLYXAMBI 10-5 MG TABS. Call back number [phone].

## 2022-02-14 NOTE — Telephone Encounter (Signed)
Refill sent as requested. 

## 2022-02-15 ENCOUNTER — Encounter: Payer: Self-pay | Admitting: Family Medicine

## 2022-02-15 DIAGNOSIS — N3944 Nocturnal enuresis: Secondary | ICD-10-CM | POA: Diagnosis not present

## 2022-02-15 DIAGNOSIS — R351 Nocturia: Secondary | ICD-10-CM | POA: Diagnosis not present

## 2022-02-19 ENCOUNTER — Encounter: Payer: Self-pay | Admitting: Family Medicine

## 2022-02-19 ENCOUNTER — Telehealth: Payer: Self-pay | Admitting: Podiatry

## 2022-02-19 ENCOUNTER — Other Ambulatory Visit: Payer: Self-pay | Admitting: Radiology

## 2022-02-19 DIAGNOSIS — C50612 Malignant neoplasm of axillary tail of left female breast: Secondary | ICD-10-CM | POA: Diagnosis not present

## 2022-02-19 DIAGNOSIS — D0512 Intraductal carcinoma in situ of left breast: Secondary | ICD-10-CM | POA: Diagnosis not present

## 2022-02-19 NOTE — Telephone Encounter (Signed)
Patient called and would like Caryl Pina to give her a call back when she can [phone]

## 2022-02-20 ENCOUNTER — Encounter: Payer: Self-pay | Admitting: Family Medicine

## 2022-02-21 ENCOUNTER — Telehealth: Payer: Self-pay | Admitting: Hematology and Oncology

## 2022-02-21 NOTE — Telephone Encounter (Signed)
LVM for patient to return call in reference to morning clinic on 8/16, [facility] will send paperwork

## 2022-02-23 ENCOUNTER — Encounter: Payer: Self-pay | Admitting: *Deleted

## 2022-02-23 DIAGNOSIS — D0512 Intraductal carcinoma in situ of left breast: Secondary | ICD-10-CM

## 2022-02-23 HISTORY — DX: Intraductal carcinoma in situ of left breast: D05.12

## 2022-02-27 NOTE — Progress Notes (Signed)
Radiation Oncology         ([phone]) [phone] ________________________________  Multidisciplinary Breast Oncology Clinic [facility]) Initial Outpatient Consultation  Name: [name] MRN: [id number]  Date: 02/28/2022  DOB: 06-08-1946  XF:[id number], Mervyn Gay, MD  Erroll Luna, MD   REFERRING PHYSICIAN: Erroll Luna, MD  DIAGNOSIS: There were no encounter diagnoses.  Stage 0 (cTis (DCIS), cN0, cM0) Left Breast, *** Carcinoma, ER*** / PR*** / Her2***, Grade ***  No diagnosis found.  HISTORY OF PRESENT ILLNESS::[name] is a 76 y.o. female who is presenting to the office today for evaluation of her newly diagnosed breast cancer. She is accompanied by ***. She is doing well overall.   She had routine screening mammography on 02/02/22 showing a possible abnormality in the left breast. She underwent unilateral left breast diagnostic mammography with tomography at [facility] on 02/13/22 showing: an indeterminate oval mass in the 1 o'clock left breast, and a stable 1.1 cm oval mass in the upper outer left breast. The upper outer left breast mass has remained unchanged since 2018, and is consistent with fibroadenoma. Left breast ultrasound on that same date further revealed the 1 o'clock left breast mass as suspicious of malignancy, measuring 1.2 cm. Ultrasound also redemonstrated the stable upper outer oval left breast mass, measuring 1.1 cm sonographically.   Biopsy of the 1 o'clock left breast mass (located 7 cmfn) on 02/19/22 showed: intermediate grade DCIS measuring 0.4 cm in the greatest linear extent (pathology comment notes that focal microinvasion cannot be excluded). Prognostic indicators significant for: estrogen receptor, ***% {positive or negative} and progesterone receptor, ***% {positive or negative}, both with {include note beside percentage here/strong staining intensity}. Proliferation marker Ki67 at ***%. HER2 {positive or negative}.  Menarche: *** years old Age at first live birth:  *** years old GP: *** LMP: *** Contraceptive: *** HRT: ***   The patient was referred today for presentation in the multidisciplinary conference.  Radiology studies and pathology slides were presented there for review and discussion of treatment options.  A consensus was discussed regarding potential next steps.  PREVIOUS RADIATION THERAPY: {EXAM; YES/NO:19492::"No"}  PAST MEDICAL HISTORY:  Past Medical History:  Diagnosis Date   Allergic rhinitis    Allergy    Arthritis    Asthma    Chronic headache    Diabetes mellitus    Ductal carcinoma in situ (DCIS) of left breast 02/23/2022   DVT (deep venous thrombosis) (HCC)    Dyspnea    Heart murmur    Hypertension    Penicillin allergy 01/19/2020   Pulmonary embolism (HCC)    Pulmonary hypertension (HCC)    Seizures ([place])     PAST SURGICAL HISTORY: Past Surgical History:  Procedure Laterality Date   ABDOMINAL HYSTERECTOMY     partial, has ovaries   CARDIAC CATHETERIZATION  12/28/2010   Mod. pulmonary hypertension, normal coronary arteries   CARDIOVERSION N/A 11/23/2020   Procedure: CARDIOVERSION;  Surgeon: Sanda Klein, MD;  Location: [facility];  Service: Cardiovascular;  Laterality: N/A;   CHOLECYSTECTOMY N/A 12/20/2021   Procedure: LAPAROSCOPIC CHOLECYSTECTOMY WITH INTRAOPERATIVE CHOLANGIOGRAM;  Surgeon: Armandina Gemma, MD;  Location: [facility];  Service: General;  Laterality: N/A;   DOPPLER ECHOCARDIOGRAPHY  10/08/2011   EF=>55%,mild asymmetric LVH, mod. TR, mod. PH, mild to mod LA dilatation   IR LUMBAR [place] W/IMG GUIDE  01/12/2020   KNEE ARTHROSCOPY Left    KNEE SURGERY     Nuclear Stress Test  05/20/2006   No ischemia   PARTIAL HYSTERECTOMY  PLANTAR FASCIA SURGERY     TONSILLECTOMY      FAMILY HISTORY:  Family History  Problem Relation Age of Onset   Hypertension Mother    Clotting disorder Mother    Breast cancer Mother    Arthritis Mother    Stroke Mother    Diabetes Mother    Cancer Brother     Alcohol abuse Father    Arthritis Sister    Diabetes Sister    Multiple sclerosis Sister    Allergies Other        grandson   Allergic rhinitis Neg Hx    Angioedema Neg Hx    Asthma Neg Hx    Atopy Neg Hx    Eczema Neg Hx    Immunodeficiency Neg Hx    Urticaria Neg Hx     SOCIAL HISTORY:  Social History   Socioeconomic History   Marital status: Widowed    Spouse name: Not on file   Number of children: Y   Years of education: Not on file   Highest education level: Not on file  Occupational History   Occupation: retired Geologist, engineering.   Tobacco Use   Smoking status: Never   Smokeless tobacco: Never  Vaping Use   Vaping Use: Never used  Substance and Sexual Activity   Alcohol use: Not Currently    Alcohol/week: 0.0 standard drinks of alcohol    Comment: occ glass on wine   Drug use: No   Sexual activity: Never  Other Topics Concern   Not on file  Social History Narrative   Widow    limited exercise.   Social Determinants of Health   Financial Resource Strain: Low Risk  (12/29/2020)   Overall Financial Resource Strain (CARDIA)    Difficulty of Paying Living Expenses: Not very hard  Food Insecurity: Not on file  Transportation Needs: Not on file  Physical Activity: Not on file  Stress: Not on file  Social Connections: Not on file    ALLERGIES:  Allergies  Allergen Reactions   Dilantin [Phenytoin] Swelling    Other reaction(s): facial swelling   Latex Hives   Oxycodone Nausea And Vomiting and Nausea Only    Other reaction(s): Abdominal Pain, Vomiting   Penicillins Swelling    Has patient had a PCN reaction causing immediate rash, facial/tongue/throat swelling, SOB or lightheadedness with hypotension patient had a PCN reaction causing severe rash involving mucus membranes or skin necrosis: [id number]} Has patient had a PCN reaction that required hospitalization/No Has patient had a PCN reaction occurring within the last 10 years: No If all of the above  answers are "NO", then may proceed with Cephalosporin use.    Other reaction(s): vomiting   Codeine Nausea Only    Tolerates tylenol with codeine   Hydrocodone Nausea And Vomiting   Nickel Hives and Rash   Pregabalin Other (See Comments)    Blurred vision. Other reaction(s): headaches/problem w/vision    MEDICATIONS:  Current Outpatient Medications  Medication Sig Dispense Refill   albuterol (VENTOLIN HFA) 108 (90 Base) MCG/ACT inhaler INHALE 2 PUFFS BY MOUTH EVERY 6 HOURS AS NEEDED FOR WHEEZING OR SHORTNESS OF BREATH 18 g 1   atorvastatin (LIPITOR) 10 MG tablet TAKE 1 TABLET(10 MG) BY MOUTH DAILY (Patient taking differently: Take 10 mg by mouth daily.) 90 tablet 3   Blood Glucose Monitoring Suppl (ONETOUCH VERIO) w/Device KIT Use to check blood sugar up to 2 times a day 1 kit 0   Empagliflozin-linaGLIPtin (GLYXAMBI) 10-5 MG TABS  Take 1 tablet by mouth daily. 30 tablet 5   EPINEPHrine 0.3 mg/0.3 mL IJ SOAJ injection USE AS DIRECTED FOR LIFE THREATENING ALLERGIC REACTIONS (Patient taking differently: Inject 0.3 mg into the muscle once as needed for anaphylaxis (life threatening allergic reactions).) 2 each 2   estradiol (ESTRACE) 0.1 MG/GM vaginal cream 0.5 g of cream intravaginally administered daily for 2 weeks, then reduce to twice weekly 42.5 g 12   famotidine (PEPCID) 40 MG tablet Take 1 (ONE) tablet by mouth every evening. 90 tablet 1   fluticasone (FLOVENT HFA) 220 MCG/ACT inhaler 2 puffs 2 times daily during [place]. (Patient taking differently: Inhale 2 puffs into the lungs 2 (two) times daily as needed (shortness of breath/wheezing).) 12 g 5   fluticasone-salmeterol (ADVAIR HFA) 230-21 MCG/ACT inhaler 2 inhalations 1-2 times a day depending on disease activity.  RINSE MOUTH AFTER USE 12 g 5   furosemide (LASIX) 20 MG tablet Take 20 mg as needed for a weight gain of 3 pounds overnight or 5 pounds in one week,. 30 tablet 3   gabapentin (NEURONTIN) 600 MG tablet TAKE 1 TABLET BY  MOUTH EVERY DAY AT BREAKFAST, 1 TABLET AT LUNCH AND 2 TABLETS AT BEDTIME 360 tablet 1   Homeopathic Products (THERAWORX RELIEF EX) Apply 1 spray topically daily as needed (muscle cramps).     hydrOXYzine (ATARAX/VISTARIL) 10 MG tablet Take 1 tablet (10 mg total) by mouth daily as needed for itching. 30 tablet 2   ipratropium (ATROVENT) 0.06 % nasal spray 1-2 sprays in each nostril 1-2 times per day as needed to dry nose 15 mL 5   irbesartan (AVAPRO) 150 MG tablet Take 1 tablet (150 mg total) by mouth daily. (Patient taking differently: Take 150 mg by mouth every morning.) 90 tablet 2   Lactase (DAIRY-RELIEF PO) Take 1 capsule by mouth daily as needed (eating dairy).      Lancets (ONETOUCH DELICA PLUS [id number]) MISC USE TO CHECK BLOOD SUGAR UP TO TWICE DAILY 100 each 5   levalbuterol (XOPENEX) 1.25 MG/3ML nebulizer solution USE 1 VIAL VIA NEBULIZER EVERY 6 HOURS AS NEEDED FOR SHORTNESS OF BREATH OR WHEEZING 900 mL 1   liver oil-zinc oxide (DESITIN) 40 % ointment Apply 1 application topically as needed for irritation.     Menthol-Methyl Salicylate (SALONPAS JET SPRAY EX) Apply 1 spray topically daily as needed (knee pain).     methimazole (TAPAZOLE) 5 MG tablet Take 1 tablet (5 mg total) by mouth daily. (Patient taking differently: Take 5 mg by mouth every morning.) 90 tablet 3   montelukast (SINGULAIR) 10 MG tablet Take 1 tablet (10 mg total) by mouth at bedtime. For asthma control. 90 tablet 1   nystatin (MYCOSTATIN) 100000 UNIT/ML suspension 72m swish and swallow after Advair 2 times daily. 473 mL 5   Olopatadine HCl (PATADAY OP) Place 1 drop into both eyes daily.     ondansetron (ZOFRAN) 4 MG tablet Take 1 tablet (4 mg total) by mouth every 8 (eight) hours as needed for nausea. 8 tablet 2   ondansetron (ZOFRAN-ODT) 8 MG disintegrating tablet Take 1 tablet (8 mg total) by mouth every 8 (eight) hours as needed for nausea or vomiting. 20 tablet 0   ONETOUCH VERIO test strip USE TO CHECK BLOOD SUGAR  UP TO TWICE DAILY AS DIRECTED 100 strip 5   oxybutynin (DITROPAN-XL) 10 MG 24 hr tablet Take 10 mg by mouth at bedtime.     pantoprazole (PROTONIX) 40 MG tablet Take 1 (ONE)  tablet by mouth every morning. 90 tablet 1   Plecanatide (TRULANCE) 3 MG TABS Take 3 mg by mouth daily as needed (constipation).     Polyethyl Glycol-Propyl Glycol 0.4-0.3 % SOLN Place 1 drop into both eyes daily.     potassium chloride SA (KLOR-CON) 20 MEQ tablet Take 1 tablet (20 mEq total) by mouth as needed (Take one daily when you need to take a Furosemide). 30 tablet 0   PRESCRIPTION MEDICATION Inhale into the lungs at bedtime. CPAP     Tiotropium Bromide Monohydrate (SPIRIVA RESPIMAT) 1.25 MCG/ACT AERS Inhale 2 puffs into the lungs daily. (Patient taking differently: Inhale 2 puffs into the lungs every morning.) 4 g 5   tolterodine (DETROL LA) 4 MG 24 hr capsule Take 4 mg by mouth at bedtime.     traMADol (ULTRAM) 50 MG tablet Take 1-2 tablets (50-100 mg total) by mouth every 6 (six) hours as needed for moderate pain or severe pain (50 mg for moderate, 100 mg for severe). 20 tablet 0   TURMERIC-GINGER PO Take 2 tablets by mouth every morning. chewables     XARELTO 20 MG TABS tablet TAKE 1 TABLET(20 MG) BY MOUTH DAILY WITH SUPPER (Patient taking differently: 20 mg daily after supper.) 90 tablet 1   Current Facility-Administered Medications  Medication Dose Route Frequency Provider Last Rate Last Admin   omalizumab Arvid Right) injection 300 mg  300 mg Subcutaneous Q28 days Kozlow, Donnamarie Poag, MD   300 mg at 02/05/22 1052    REVIEW OF SYSTEMS: A 10+ POINT REVIEW OF SYSTEMS WAS OBTAINED including neurology, dermatology, psychiatry, cardiac, respiratory, lymph, extremities, GI, GU, musculoskeletal, constitutional, reproductive, HEENT. On the provided form, she reports ***. She denies *** and any other symptoms.    PHYSICAL EXAM:  vitals were not taken for this visit.  {may need to copy over vitals} Lungs are clear to auscultation  bilaterally. Heart has regular rate and rhythm. No palpable cervical, supraclavicular, or axillary adenopathy. Abdomen soft, non-tender, normal bowel sounds. Breast: *** breast with no palpable mass, nipple discharge, or bleeding. *** breast with ***.   KPS = ***  100 - Normal; no complaints; no evidence of disease. 90   - Able to carry on normal activity; minor signs or symptoms of disease. 80   - Normal activity with effort; some signs or symptoms of disease. 101   - Cares for self; unable to carry on normal activity or to do active work. 60   - Requires occasional assistance, but is able to care for most of his personal needs. 50   - Requires considerable assistance and frequent medical care. 3   - Disabled; requires special care and assistance. 25   - Severely disabled; hospital admission is indicated although death not imminent. 24   - Very sick; hospital admission necessary; active supportive treatment necessary. 10   - Moribund; fatal processes progressing rapidly. 0     - Dead  Karnofsky DA, Abelmann [place], Craver LS and Burchenal [facility] [phone]) The use of the nitrogen mustards in the palliative treatment of carcinoma: with particular reference to bronchogenic carcinoma Cancer 1 634-56  LABORATORY DATA:  Lab Results  Component Value Date   WBC 6.9 12/29/2021   HGB 10.9 (L) 12/29/2021   HCT 33.0 (L) 12/29/2021   MCV 77.7 (L) 12/29/2021   PLT 425.0 (H) 12/29/2021   Lab Results  Component Value Date   NA 140 12/29/2021   K 4.2 12/29/2021   CL 104 12/29/2021  CO2 29 12/29/2021   Lab Results  Component Value Date   ALT 12 12/20/2021   AST 33 12/20/2021   ALKPHOS 90 12/20/2021   BILITOT 2.4 (H) 12/20/2021    PULMONARY FUNCTION TEST:   Review Flowsheet        No data to display          RADIOGRAPHY: No results found.    IMPRESSION: ***  Patient will be a good candidate for breast conservation with radiotherapy to the left breast. We discussed the general course of  radiation, potential side effects, and toxicities with radiation and the patient is interested in this approach. ***   PLAN:  ***    ------------------------------------------------  Blair Promise, PhD, MD  This document serves as a record of services personally performed by Gery Pray, MD. It was created on his behalf by Roney Mans, a trained medical scribe. The creation of this record is based on the scribe's personal observations and the provider's statements to them. This document has been checked and approved by the attending provider.

## 2022-02-28 ENCOUNTER — Inpatient Hospital Stay: Payer: Medicare Other

## 2022-02-28 ENCOUNTER — Inpatient Hospital Stay: Payer: Medicare Other | Attending: Hematology and Oncology | Admitting: Hematology and Oncology

## 2022-02-28 ENCOUNTER — Inpatient Hospital Stay: Payer: Medicare Other | Admitting: Licensed Clinical Social Worker

## 2022-02-28 ENCOUNTER — Encounter: Payer: Self-pay | Admitting: Genetic Counselor

## 2022-02-28 ENCOUNTER — Ambulatory Visit
Admission: RE | Admit: 2022-02-28 | Discharge: 2022-02-28 | Disposition: A | Discharge status: Home / Self Care | Payer: Medicare Other | Source: Ambulatory Visit | Attending: Radiation Oncology | Admitting: Radiation Oncology

## 2022-02-28 ENCOUNTER — Ambulatory Visit: Payer: Medicare Other | Admitting: Physical Therapy

## 2022-02-28 ENCOUNTER — Encounter: Payer: Self-pay | Admitting: Hematology and Oncology

## 2022-02-28 ENCOUNTER — Inpatient Hospital Stay: Payer: Medicare Other | Admitting: Genetic Counselor

## 2022-02-28 ENCOUNTER — Encounter: Payer: Self-pay | Admitting: *Deleted

## 2022-02-28 ENCOUNTER — Other Ambulatory Visit: Payer: Self-pay

## 2022-02-28 ENCOUNTER — Ambulatory Visit: Payer: Self-pay | Admitting: Surgery

## 2022-02-28 DIAGNOSIS — E119 Type 2 diabetes mellitus without complications: Secondary | ICD-10-CM | POA: Insufficient documentation

## 2022-02-28 DIAGNOSIS — Z800 Family history of malignant neoplasm of digestive organs: Secondary | ICD-10-CM

## 2022-02-28 DIAGNOSIS — D0512 Intraductal carcinoma in situ of left breast: Secondary | ICD-10-CM | POA: Diagnosis not present

## 2022-02-28 DIAGNOSIS — Z7901 Long term (current) use of anticoagulants: Secondary | ICD-10-CM | POA: Diagnosis not present

## 2022-02-28 DIAGNOSIS — I10 Essential (primary) hypertension: Secondary | ICD-10-CM | POA: Insufficient documentation

## 2022-02-28 DIAGNOSIS — Z8042 Family history of malignant neoplasm of prostate: Secondary | ICD-10-CM | POA: Diagnosis not present

## 2022-02-28 DIAGNOSIS — Z803 Family history of malignant neoplasm of breast: Secondary | ICD-10-CM | POA: Diagnosis not present

## 2022-02-28 DIAGNOSIS — Z853 Personal history of malignant neoplasm of breast: Secondary | ICD-10-CM | POA: Diagnosis not present

## 2022-02-28 DIAGNOSIS — Z86711 Personal history of pulmonary embolism: Secondary | ICD-10-CM | POA: Insufficient documentation

## 2022-02-28 DIAGNOSIS — Z90710 Acquired absence of both cervix and uterus: Secondary | ICD-10-CM | POA: Diagnosis not present

## 2022-02-28 LAB — CMP (CANCER CENTER ONLY)
ALT: 10 U/L (ref 0–44)
AST: 16 U/L (ref 15–41)
Albumin: 4 g/dL (ref 3.5–5.0)
Alkaline Phosphatase: 80 U/L (ref 38–126)
Anion gap: 3 — ABNORMAL LOW (ref 5–15)
BUN: 16 mg/dL (ref 8–23)
CO2: 29 mmol/L (ref 22–32)
Calcium: 9.1 mg/dL (ref 8.9–10.3)
Chloride: 106 mmol/L (ref 98–111)
Creatinine: 1.03 mg/dL — ABNORMAL HIGH (ref 0.44–1.00)
GFR, Estimated: 57 mL/min — ABNORMAL LOW (ref >60)
Glucose, Bld: 99 mg/dL (ref 70–99)
Potassium: 4.4 mmol/L (ref 3.5–5.1)
Sodium: 138 mmol/L (ref 135–145)
Total Bilirubin: 0.8 mg/dL (ref 0.3–1.2)
Total Protein: 7 g/dL (ref 6.5–8.1)

## 2022-02-28 LAB — CBC WITH DIFFERENTIAL (CANCER CENTER ONLY)
Abs Immature Granulocytes: 0.03 10*3/uL (ref 0.00–0.07)
Basophils Absolute: 0.1 10*3/uL (ref 0.0–0.1)
Basophils Relative: 1 %
Eosinophils Absolute: 0.1 10*3/uL (ref 0.0–0.5)
Eosinophils Relative: 2 %
HCT: 36.7 % (ref 36.0–46.0)
Hemoglobin: 12.2 g/dL (ref 12.0–15.0)
Immature Granulocytes: 1 %
Lymphocytes Relative: 46 %
Lymphs Abs: 3 10*3/uL (ref 0.7–4.0)
MCH: 24.8 pg — ABNORMAL LOW (ref 26.0–34.0)
MCHC: 33.2 g/dL (ref 30.0–36.0)
MCV: 74.6 fL — ABNORMAL LOW (ref 80.0–100.0)
Monocytes Absolute: 0.7 10*3/uL (ref 0.1–1.0)
Monocytes Relative: 11 %
Neutro Abs: 2.5 10*3/uL (ref 1.7–7.7)
Neutrophils Relative %: 39 %
Platelet Count: 253 10*3/uL (ref 150–400)
RBC: 4.92 MIL/uL (ref 3.87–5.11)
RDW: 16.9 % — ABNORMAL HIGH (ref 11.5–15.5)
WBC Count: 6.5 10*3/uL (ref 4.0–10.5)
nRBC: 0 % (ref 0.0–0.2)

## 2022-02-28 LAB — GENETIC SCREENING ORDER

## 2022-02-28 NOTE — Assessment & Plan Note (Signed)
This is a very pleasant 76 year old postmenopausal female patient with left breast DCIS, intermediate grade, ER/PR status unknown referred to breast [place] for additional recommendations.  We have discussed the following details about DCIS.  Pathology review: I discussed with the patient the difference between DCIS and invasive breast cancer. It is considered a precancerous lesion. DCIS is classified as a Stage 0 breast cancer. It is generally detected through mammograms as calcifications. We discussed the significance of grades and its impact on prognosis. We also discussed the importance of ER and PR receptors and their implications to adjuvant treatment options. Prognosis of DCIS dependence on grade and degree of comedo necrosis. It is anticipated that if not treated, 20-30% of DCIS can develop into invasive breast cancer.  Recommendation: 1. Breast conserving surgery 2. Followed by adjuvant radiation therapy 3. Followed by antiestrogen therapy with tamoxifen/aromatase inhibitors based on menopausal status 5 years  Tamoxifen counseling: We discussed the risks and benefits of tamoxifen. These include but not limited to insomnia, hot flashes, mood changes, vaginal dryness, and weight gain. Although rare, serious side effects including endometrial cancer, risk of blood clots were also discussed. We strongly believe that the benefits far outweigh the risks. Patient understands these risks and consented to starting treatment. Planned treatment duration is 5 years.  Aromatase inhibitors counseling: We have discussed the mechanism of action of aromatase inhibitors today.  We have discussed adverse effects including but not limited to menopausal symptoms, increased risk of osteoporosis and fractures, cardiovascular events, arthralgias and myalgias.  We do believe that the benefits far outweigh the risks.  Plan treatment duration of 5 years.  We have discussed that since at this time the ER/PR status is  undetermined, we cannot quite comment if she will be a candidate for antiestrogen therapy.  However we have discussed about both tamoxifen and aromatase inhibitors today.  Given her past medical history of PE, we may choose to start her on aromatase inhibitors if she is ER/PR positive.  She expressed understanding of all the recommendations.  She will return to clinic after surgery to review further recommendations.  She declined specimen collection blood study today.

## 2022-02-28 NOTE — Progress Notes (Signed)
[place] NOTE  Patient Care Team: [name], MD as PCP - General (Family Medicine) Croitoru, Dani Gobble, MD as PCP - Cardiology (Cardiology) Hortencia Pilar, MD as Consulting Physician (Ophthalmology) Neldon Mc, Donnamarie Poag, MD as Consulting Physician (Allergy and Immunology) Anda Kraft, MD as Consulting Physician (Endocrinology) Inocencio Homes, DPM as Consulting Physician (Podiatry) Croitoru, Dani Gobble, MD as Consulting Physician (Cardiology) Druscilla Brownie, MD as Consulting Physician (Dermatology) Boyd Kerbs, MD as Referring Physician (Specialist) Dionisio David, [place] as Referring Physician (Chiropractic Medicine) Ronald Lobo, MD as Consulting Physician (Gastroenterology) Debbora Dus, [facility] as Pharmacist (Pharmacist) Hallows, Verlee Monte, MD (Orthopedic Surgery) Dohmeier, Asencion Partridge, MD as Consulting Physician (Neurology) Rockwell Germany, RN as Oncology Nurse Navigator Mauro Kaufmann, RN as Oncology Nurse Navigator Erroll Luna, MD as Consulting Physician (General Surgery) [name], MD as Consulting Physician (Hematology and Oncology) Gery Pray, MD as Consulting Physician (Radiation Oncology)  CHIEF COMPLAINTS/PURPOSE OF CONSULTATION:  Newly diagnosed breast cancer  HISTORY OF PRESENTING ILLNESS:  [name] 76 y.o. female is here because of recent diagnosis of left breast cancer  I reviewed her records extensively and collaborated the history with the patient.  SUMMARY OF ONCOLOGIC HISTORY: Oncology History  Ductal carcinoma in situ (DCIS) of left breast  02/02/2022 Mammogram   Screening mammogram showed asymmetry in the left breast which is indeterminate.  Diagnostic mammogram and ultrasound was recommended.  Unilateral diagnostic mammogram showed a stable 1.1 cm oval mass in the left breast upper outer aspect middle depth, 6.5 cm from the nipple.  No other significant masses or calcifications were seen. [place] recommended. Stable 1.1 cm  oval mass in the left breast upper outer aspect middle depth has been stable since at least 2018 and is consistent with a fibroadenoma and is benign.   02/19/2022 Pathology Results   Pathology from left breast needle core biopsy showed DCIS, intermediate grade, cannot rule focal microinvasion, no evidence of necrosis or calcifications, estrogen and progesterone unsuccessful on the biopsy specimen due to the neoplasm not present at the deeper levels   02/23/2022 Initial Diagnosis   Ductal carcinoma in situ (DCIS) of left breast    Today patient is here for an initial visit by herself.  Her Sister [name] was on the phone.  Her sister and her mom both had breast cancer and took tamoxifen.  Patient denies any new complaints except for recent knee replacement on the right back in March followed by cholecystectomy a couple months later.  She had a history of PE many years ago after a knee arthroscopy and continues on anticoagulation with Xarelto.  Besides this, she seems to be in good shape.  She has some arthralgias and arthritis but otherwise denies any complaints. Rest of the pertinent 10 point ROS reviewed and negative  MEDICAL HISTORY:  Past Medical History:  Diagnosis Date   Allergic rhinitis    Allergy    Arthritis    Asthma    Breast cancer ([place])    Chronic headache    Diabetes mellitus    Ductal carcinoma in situ (DCIS) of left breast 02/23/2022   DVT (deep venous thrombosis) (HCC)    Dyspnea    Heart murmur    Hypertension    Penicillin allergy 01/19/2020   Pulmonary embolism (HCC)    Pulmonary hypertension (HCC)    Seizures ([place])     SURGICAL HISTORY: Past Surgical History:  Procedure Laterality Date   ABDOMINAL HYSTERECTOMY     partial, has ovaries  CARDIAC CATHETERIZATION  12/28/2010   Mod. pulmonary hypertension, normal coronary arteries   CARDIOVERSION N/A 11/23/2020   Procedure: CARDIOVERSION;  Surgeon: Sanda Klein, MD;  Location: [facility];  Service:  Cardiovascular;  Laterality: N/A;   CHOLECYSTECTOMY N/A 12/20/2021   Procedure: LAPAROSCOPIC CHOLECYSTECTOMY WITH INTRAOPERATIVE CHOLANGIOGRAM;  Surgeon: Armandina Gemma, MD;  Location: [facility];  Service: General;  Laterality: N/A;   DOPPLER ECHOCARDIOGRAPHY  10/08/2011   EF=>55%,mild asymmetric LVH, mod. TR, mod. PH, mild to mod LA dilatation   IR LUMBAR [place] W/IMG GUIDE  01/12/2020   KNEE ARTHROSCOPY Left    KNEE SURGERY     Nuclear Stress Test  05/20/2006   No ischemia   PARTIAL HYSTERECTOMY     PLANTAR FASCIA SURGERY     TONSILLECTOMY      SOCIAL HISTORY: Social History   Socioeconomic History   Marital status: Widowed    Spouse name: Not on file   Number of children: Y   Years of education: Not on file   Highest education level: Not on file  Occupational History   Occupation: retired Geologist, engineering.   Tobacco Use   Smoking status: Never   Smokeless tobacco: Never  Vaping Use   Vaping Use: Never used  Substance and Sexual Activity   Alcohol use: Not Currently    Alcohol/week: 0.0 standard drinks of alcohol    Comment: occ glass on wine   Drug use: No   Sexual activity: Never  Other Topics Concern   Not on file  Social History Narrative   Widow    limited exercise.   Social Determinants of Health   Financial Resource Strain: Low Risk  (12/29/2020)   Overall Financial Resource Strain (CARDIA)    Difficulty of Paying Living Expenses: Not very hard  Food Insecurity: Not on file  Transportation Needs: Not on file  Physical Activity: Not on file  Stress: Not on file  Social Connections: Not on file  Intimate Partner Violence: Not on file    FAMILY HISTORY: Family History  Problem Relation Age of Onset   Hypertension Mother    Clotting disorder Mother    Breast cancer Mother    Arthritis Mother    Stroke Mother    Diabetes Mother    Alcohol abuse Father    Breast cancer Sister    Arthritis Sister    Diabetes Sister    Breast cancer Sister     Multiple sclerosis Sister    Cancer Brother    Allergies Other        grandson   Allergic rhinitis Neg Hx    Angioedema Neg Hx    Asthma Neg Hx    Atopy Neg Hx    Eczema Neg Hx    Immunodeficiency Neg Hx    Urticaria Neg Hx     ALLERGIES:  is allergic to dilantin [phenytoin], latex, oxycodone, penicillins, phenytoin sodium extended, codeine, hydrocodone, nickel, and pregabalin.  MEDICATIONS:  Current Outpatient Medications  Medication Sig Dispense Refill   albuterol (VENTOLIN HFA) 108 (90 Base) MCG/ACT inhaler INHALE 2 PUFFS BY MOUTH EVERY 6 HOURS AS NEEDED FOR WHEEZING OR SHORTNESS OF BREATH 18 g 1   atorvastatin (LIPITOR) 10 MG tablet TAKE 1 TABLET(10 MG) BY MOUTH DAILY (Patient taking differently: Take 10 mg by mouth daily.) 90 tablet 3   Blood Glucose Monitoring Suppl (ONETOUCH VERIO) w/Device KIT Use to check blood sugar up to 2 times a day 1 kit 0   EPINEPHrine 0.3 mg/0.3 mL IJ  SOAJ injection USE AS DIRECTED FOR LIFE THREATENING ALLERGIC REACTIONS (Patient taking differently: Inject 0.3 mg into the muscle once as needed for anaphylaxis (life threatening allergic reactions).) 2 each 2   famotidine (PEPCID) 40 MG tablet Take 1 (ONE) tablet by mouth every evening. 90 tablet 1   fluticasone (FLOVENT HFA) 220 MCG/ACT inhaler 2 puffs 2 times daily during Flare Up. (Patient taking differently: Inhale 2 puffs into the lungs 2 (two) times daily as needed (shortness of breath/wheezing).) 12 g 5   gabapentin (NEURONTIN) 600 MG tablet TAKE 1 TABLET BY MOUTH EVERY DAY AT BREAKFAST, 1 TABLET AT LUNCH AND 2 TABLETS AT BEDTIME 360 tablet 1   glucose blood (ONETOUCH VERIO) test strip 1 strip 2 (two) times daily     Homeopathic Products (THERAWORX RELIEF EX) Apply 1 spray topically daily as needed (muscle cramps).     hydrOXYzine (ATARAX/VISTARIL) 10 MG tablet Take 1 tablet (10 mg total) by mouth daily as needed for itching. 30 tablet 2   ipratropium (ATROVENT) 0.06 % nasal spray 1-2 sprays in each  nostril 1-2 times per day as needed to dry nose 15 mL 5   irbesartan (AVAPRO) 150 MG tablet Take 1 tablet (150 mg total) by mouth daily. (Patient taking differently: Take 150 mg by mouth every morning.) 90 tablet 2   Lactase (DAIRY-RELIEF PO) Take 1 capsule by mouth daily as needed (eating dairy).      Lancets (ONETOUCH DELICA PLUS [id number]) MISC USE TO CHECK BLOOD SUGAR UP TO TWICE DAILY 100 each 5   levalbuterol (XOPENEX) 1.25 MG/3ML nebulizer solution USE 1 VIAL VIA NEBULIZER EVERY 6 HOURS AS NEEDED FOR SHORTNESS OF BREATH OR WHEEZING 900 mL 1   liver oil-zinc oxide (DESITIN) 40 % ointment Apply 1 application topically as needed for irritation.     Menthol-Methyl Salicylate (SALONPAS JET SPRAY EX) Apply 1 spray topically daily as needed (knee pain).     methimazole (TAPAZOLE) 5 MG tablet Take 1 tablet (5 mg total) by mouth daily. (Patient taking differently: Take 5 mg by mouth every morning.) 90 tablet 3   montelukast (SINGULAIR) 10 MG tablet Take 1 tablet (10 mg total) by mouth at bedtime. For asthma control. 90 tablet 1   nystatin (MYCOSTATIN) 100000 UNIT/ML suspension 62m swish and swallow after Advair 2 times daily. 473 mL 5   Olopatadine HCl (PATADAY OP) Place 1 drop into both eyes daily.     ondansetron (ZOFRAN-ODT) 8 MG disintegrating tablet Take 1 tablet (8 mg total) by mouth every 8 (eight) hours as needed for nausea or vomiting. (Patient taking differently: Take 4 mg by mouth every 8 (eight) hours as needed for nausea or vomiting.) 20 tablet 0   ONETOUCH VERIO test strip USE TO CHECK BLOOD SUGAR UP TO TWICE DAILY AS DIRECTED 100 strip 5   oxybutynin (DITROPAN-XL) 10 MG 24 hr tablet Take 10 mg by mouth at bedtime.     pantoprazole (PROTONIX) 40 MG tablet Take 1 (ONE) tablet by mouth every morning. 90 tablet 1   Plecanatide (TRULANCE) 3 MG TABS Take 3 mg by mouth daily as needed (constipation).     Polyethyl Glycol-Propyl Glycol 0.4-0.3 % SOLN Place 1 drop into both eyes daily.      potassium chloride SA (KLOR-CON) 20 MEQ tablet Take 1 tablet (20 mEq total) by mouth as needed (Take one daily when you need to take a Furosemide). 30 tablet 0   PRESCRIPTION MEDICATION Inhale into the lungs at bedtime. CPAP  tolterodine (DETROL LA) 4 MG 24 hr capsule Take 4 mg by mouth at bedtime.     TURMERIC-GINGER PO Take 2 tablets by mouth every morning. chewables     XARELTO 20 MG TABS tablet TAKE 1 TABLET(20 MG) BY MOUTH DAILY WITH SUPPER (Patient taking differently: 20 mg daily after supper.) 90 tablet 1   Empagliflozin-linaGLIPtin (GLYXAMBI) 10-5 MG TABS Take 1 tablet by mouth daily. 30 tablet 5   estradiol (ESTRACE) 0.1 MG/GM vaginal cream 0.5 g of cream intravaginally administered daily for 2 weeks, then reduce to twice weekly 42.5 g 12   furosemide (LASIX) 20 MG tablet Take 20 mg as needed for a weight gain of 3 pounds overnight or 5 pounds in one week,. 30 tablet 3   traMADol (ULTRAM) 50 MG tablet Take 1-2 tablets (50-100 mg total) by mouth every 6 (six) hours as needed for moderate pain or severe pain (50 mg for moderate, 100 mg for severe). 20 tablet 0   Current Facility-Administered Medications  Medication Dose Route Frequency Provider Last Rate Last Admin   omalizumab Arvid Right) injection 300 mg  300 mg Subcutaneous Q28 days Kozlow, Donnamarie Poag, MD   300 mg at 02/05/22 1052    REVIEW OF SYSTEMS:   Constitutional: Denies fevers, chills or abnormal night sweats Eyes: Denies blurriness of vision, double vision or watery eyes Ears, nose, mouth, throat, and face: Denies mucositis or sore throat Respiratory: Denies cough, dyspnea or wheezes Cardiovascular: Denies palpitation, chest discomfort or lower extremity swelling Gastrointestinal:  Denies nausea, heartburn or change in bowel habits Skin: Denies abnormal skin rashes Lymphatics: Denies new lymphadenopathy or easy bruising Neurological:Denies numbness, tingling or new weaknesses Behavioral/Psych: Mood is stable, no new changes   Breast: Denies any palpable lumps or discharge All other systems were reviewed with the patient and are negative.  PHYSICAL EXAMINATION: ECOG PERFORMANCE STATUS: 0 - Asymptomatic  Vitals:   02/28/22 0854  BP: (!) 140/79  Pulse: 69  Resp: 18  Temp: 97.7 F (36.5 C)  SpO2: 100%   Filed Weights   02/28/22 0854  Weight: 194 lb 14.4 oz (88.4 kg)    GENERAL:alert, no distress and comfortable SKIN: skin color, texture, turgor are normal, no rashes or significant lesions EYES: normal, conjunctiva are pink and non-injected, sclera clear OROPHARYNX:no exudate, no erythema and lips, buccal mucosa, and tongue normal  NECK: supple, thyroid normal size, non-tender, without nodularity LYMPH:  no palpable lymphadenopathy in the cervical, axillary or inguinal LUNGS: clear to auscultation and percussion with normal breathing effort HEART: regular rate & rhythm and no murmurs.  Right lower extremity swelling greater than left likely because of recent knee replacement. ABDOMEN:abdomen soft, non-tender and normal bowel sounds Musculoskeletal:no cyanosis of digits and no clubbing  PSYCH: alert & oriented x 3 with fluent speech NEURO: no focal motor/sensory deficits BREAST: Postbiopsy changes, no definitive palpable masses.  LABORATORY DATA:  I have reviewed the data as listed Lab Results  Component Value Date   WBC 6.5 02/28/2022   HGB 12.2 02/28/2022   HCT 36.7 02/28/2022   MCV 74.6 (L) 02/28/2022   PLT 253 02/28/2022   Lab Results  Component Value Date   NA 138 02/28/2022   K 4.4 02/28/2022   CL 106 02/28/2022   CO2 29 02/28/2022    RADIOGRAPHIC STUDIES: I have personally reviewed the radiological reports and agreed with the findings in the report.  ASSESSMENT AND PLAN:  Ductal carcinoma in situ (DCIS) of left breast This is a very pleasant  76 year old postmenopausal female patient with left breast DCIS, intermediate grade, ER/PR status unknown referred to breast [place] for  additional recommendations.  We have discussed the following details about DCIS.  Pathology review: I discussed with the patient the difference between DCIS and invasive breast cancer. It is considered a precancerous lesion. DCIS is classified as a Stage 0 breast cancer. It is generally detected through mammograms as calcifications. We discussed the significance of grades and its impact on prognosis. We also discussed the importance of ER and PR receptors and their implications to adjuvant treatment options. Prognosis of DCIS dependence on grade and degree of comedo necrosis. It is anticipated that if not treated, 20-30% of DCIS can develop into invasive breast cancer.  Recommendation: 1. Breast conserving surgery 2. Followed by adjuvant radiation therapy 3. Followed by antiestrogen therapy with tamoxifen/aromatase inhibitors based on menopausal status 5 years  Tamoxifen counseling: We discussed the risks and benefits of tamoxifen. These include but not limited to insomnia, hot flashes, mood changes, vaginal dryness, and weight gain. Although rare, serious side effects including endometrial cancer, risk of blood clots were also discussed. We strongly believe that the benefits far outweigh the risks. Patient understands these risks and consented to starting treatment. Planned treatment duration is 5 years.  Aromatase inhibitors counseling: We have discussed the mechanism of action of aromatase inhibitors today.  We have discussed adverse effects including but not limited to menopausal symptoms, increased risk of osteoporosis and fractures, cardiovascular events, arthralgias and myalgias.  We do believe that the benefits far outweigh the risks.  Plan treatment duration of 5 years.  We have discussed that since at this time the ER/PR status is undetermined, we cannot quite comment if she will be a candidate for antiestrogen therapy.  However we have discussed about both tamoxifen and aromatase inhibitors  today.  Given her past medical history of PE, we may choose to start her on aromatase inhibitors if she is ER/PR positive.  She expressed understanding of all the recommendations.  She will return to clinic after surgery to review further recommendations.  She declined specimen collection blood study today.  Total time spent: 45 minutes including history, physical exam, review of records, counseling and coordination of All questions were answered. The patient knows to call the clinic with any problems, questions or concerns.    [name], MD 02/28/22

## 2022-02-28 NOTE — Progress Notes (Signed)
REFERRING PROVIDER: [name], MD  PRIMARY PROVIDER:  [name], MD  PRIMARY REASON FOR VISIT:  Encounter Diagnoses  Name Primary?   Ductal carcinoma in situ (DCIS) of left breast Yes   Family history of breast cancer    Family history of colon cancer in mother    HISTORY OF PRESENT ILLNESS:   [name], a 76 y.o. female, was seen for a [place] cancer genetics consultation at the request of Dr. Chryl Heck due to a personal and family history of cancer.  [name] presents to clinic today to discuss the possibility of a hereditary predisposition to cancer, to discuss genetic testing, and to further clarify her future cancer risks, as well as potential cancer risks for family members.   In August 2023, at the age of 30, [name] was diagnosed with ductal carcinoma in situ of the left breast.   CANCER HISTORY:  Oncology History  Ductal carcinoma in situ (DCIS) of left breast  02/02/2022 Mammogram   Screening mammogram showed asymmetry in the left breast which is indeterminate.  Diagnostic mammogram and ultrasound was recommended.  Unilateral diagnostic mammogram showed a stable 1.1 cm oval mass in the left breast upper outer aspect middle depth, 6.5 cm from the nipple.  No other significant masses or calcifications were seen. [place] recommended. Stable 1.1 cm oval mass in the left breast upper outer aspect middle depth has been stable since at least 2018 and is consistent with a fibroadenoma and is benign.   02/19/2022 Pathology Results   Pathology from left breast needle core biopsy showed DCIS, intermediate grade, cannot rule focal microinvasion, no evidence of necrosis or calcifications, estrogen and progesterone unsuccessful on the biopsy specimen due to the neoplasm not present at the deeper levels   02/23/2022 Initial Diagnosis   Ductal carcinoma in situ (DCIS) of left breast      RISK FACTORS:  Menarche was at age 46.  First live birth at age 81.  OCP use for  approximately 0 years.  Ovaries intact: yes.  Uterus intact: yes.  Menopausal status: postmenopausal.  HRT use: 0 years. Colonoscopy: yes Mammogram within the last year: yes. Any excessive radiation exposure in the past: no  Past Medical History:  Diagnosis Date   Allergic rhinitis    Allergy    Arthritis    Asthma    Breast cancer ([place])    Chronic headache    Diabetes mellitus    Ductal carcinoma in situ (DCIS) of left breast 02/23/2022   DVT (deep venous thrombosis) (HCC)    Dyspnea    Heart murmur    Hypertension    Penicillin allergy 01/19/2020   Pulmonary embolism (HCC)    Pulmonary hypertension (HCC)    Seizures (HCC)     Past Surgical History:  Procedure Laterality Date   ABDOMINAL HYSTERECTOMY     partial, has ovaries   CARDIAC CATHETERIZATION  12/28/2010   Mod. pulmonary hypertension, normal coronary arteries   CARDIOVERSION N/A 11/23/2020   Procedure: CARDIOVERSION;  Surgeon: Sanda Klein, MD;  Location: [facility];  Service: Cardiovascular;  Laterality: N/A;   CHOLECYSTECTOMY N/A 12/20/2021   Procedure: LAPAROSCOPIC CHOLECYSTECTOMY WITH INTRAOPERATIVE CHOLANGIOGRAM;  Surgeon: Armandina Gemma, MD;  Location: [facility];  Service: General;  Laterality: N/A;   DOPPLER ECHOCARDIOGRAPHY  10/08/2011   EF=>55%,mild asymmetric LVH, mod. TR, mod. PH, mild to mod LA dilatation   IR LUMBAR DISC ASPIRATION W/IMG GUIDE  01/12/2020   KNEE ARTHROSCOPY Left    KNEE SURGERY  Nuclear Stress Test  05/20/2006   No ischemia   PARTIAL HYSTERECTOMY     PLANTAR FASCIA SURGERY     TONSILLECTOMY      Social History   Socioeconomic History   Marital status: Widowed    Spouse name: Not on file   Number of children: Y   Years of education: Not on file   Highest education level: Not on file  Occupational History   Occupation: retired Geologist, engineering.   Tobacco Use   Smoking status: Never   Smokeless tobacco: Never  Vaping Use   Vaping Use: Never used  Substance and Sexual  Activity   Alcohol use: Not Currently    Alcohol/week: 0.0 standard drinks of alcohol    Comment: occ glass on wine   Drug use: No   Sexual activity: Never  Other Topics Concern   Not on file  Social History Narrative   Widow    limited exercise.   Social Determinants of Health   Financial Resource Strain: Medium Risk (02/28/2022)   Overall Financial Resource Strain (CARDIA)    Difficulty of Paying Living Expenses: Somewhat hard  Food Insecurity: No Food Insecurity (02/28/2022)   Hunger Vital Sign    Worried About Running Out of Food in the Last Year: Never true    Ran Out of Food in the Last Year: Never true  Transportation Needs: No Transportation Needs (02/28/2022)   PRAPARE - Hydrologist (Medical): No    Lack of Transportation (Non-Medical): No  Physical Activity: Not on file  Stress: Not on file  Social Connections: Not on file     FAMILY HISTORY:  We obtained a detailed, 4-generation family history.  Significant diagnoses are listed below: Family History  Problem Relation Age of Onset   Hypertension Mother    Clotting disorder Mother    Breast cancer Mother 58   Arthritis Mother    Stroke Mother    Diabetes Mother    Colon cancer Mother 69 - 78   Alcohol abuse Father    Breast cancer Half-Sister 60       recurrence at 40, reports positive genetic testing   Arthritis Half-Sister    Diabetes Half-Sister    Colon cancer Half-Sister 44   Multiple sclerosis Sister    Prostate cancer Half-Brother    Stomach cancer Half-Brother    Allergies Other        grandson   Allergic rhinitis Neg Hx    Angioedema Neg Hx    Asthma Neg Hx    Atopy Neg Hx    Eczema Neg Hx    Immunodeficiency Neg Hx    Urticaria Neg Hx      [name]'s maternal half-sister was diagnosed with breast cancer at age 73 and recurrent breast cancer at age 38, she reports positive genetic testing but does not know the gene. A second maternal half-sister was diagnosed  with colon cancer at age 89, she died due to metastatic cancer. Her paternal half-brother was diagnosed with prostate cancer and later died due to metastatic stomach cancer. Her mother was diagnosed with breast cancer at age 78 and colon cancer in her 96s, she died due to metastatic colon cancer at age 79. Her paternal aunt was diagnosed with an unknown cancer at an unknown age. There is no reported Ashkenazi Jewish ancestry.   GENETIC COUNSELING ASSESSMENT: [name] is a 76 y.o. female with a personal and family history of cancer which is somewhat suggestive  of a hereditary predisposition to cancer given the family history of breast cancer and her sister's positive genetic testing. We, therefore, discussed and recommended the following at today's visit.   DISCUSSION: We discussed that 5 - 10% of cancer is hereditary, with most cases of breast cancer associated with BRCA1/2.  There are other genes that can be associated with hereditary breast cancer syndromes.  We discussed that testing is beneficial for several reasons including knowing how to follow individuals after completing their treatment, identifying whether potential treatment options would be beneficial, and understanding if other family members could be at risk for cancer and allowing them to undergo genetic testing.   We reviewed the characteristics, features and inheritance patterns of hereditary cancer syndromes. We also discussed genetic testing, including the appropriate family members to test, the process of testing, insurance coverage and turn-around-time for results. We discussed the implications of a negative, positive, carrier and/or variant of uncertain significant result. We recommended [name] pursue genetic testing for a panel that includes genes associated with breast, colon, and prostate cancer.   [name]  was offered a common hereditary cancer panel (47 genes) and an expanded pan-cancer panel (77 genes). [name] was  informed of the benefits and limitations of each panel, including that expanded pan-cancer panels contain genes that do not have clear management guidelines at this point in time.  We also discussed that as the number of genes included on a panel increases, the chances of variants of uncertain significance increases. After considering the benefits and limitations of each gene panel, [name] elected to have [place] Panel.  The CustomNext-Cancer+RNAinsight panel offered by [facility] includes sequencing and rearrangement analysis for the following 47 genes:  APC, ATM, AXIN2, BARD1, BMPR1A, BRCA1, BRCA2, BRIP1, CDH1, CDK4, CDKN2A, CHEK2, CTNNA1, DICER1, EPCAM, GREM1, HOXB13, KIT, MEN1, MLH1, MSH2, MSH3, MSH6, MUTYH, NBN, NF1, NTHL1, PALB2, PDGFRA, PMS2, POLD1, POLE, PTEN, RAD50, RAD51C, RAD51D, SDHA, SDHB, SDHC, SDHD, SMAD4, SMARCA4, STK11, TP53, TSC1, TSC2, and VHL.  RNA data is routinely analyzed for use in variant interpretation for all genes.  Based on [name]'s personal and family history of cancer, she meets medical criteria for genetic testing. Despite that she meets criteria, she may still have an out of pocket cost. We discussed that if her out of pocket cost for testing is over $100, the laboratory will call and confirm whether she wants to proceed with testing.  If the out of pocket cost of testing is less than $100 she will be billed by the genetic testing laboratory.   PLAN: After considering the risks, benefits, and limitations, [name] provided informed consent to pursue genetic testing and the blood sample was sent to [facility] for analysis of the CustomNext Panel. Results should be available within approximately 2-3 weeks' time, at which point they will be disclosed by telephone to [name], as will any additional recommendations warranted by these results. [name] will receive a summary of her genetic counseling visit and a copy of her results once available.  This information will also be available in Epic.   [name]'s questions were answered to her satisfaction today. Our contact information was provided should additional questions or concerns arise. Thank you for the referral and allowing [place] to share in the care of your patient.   [name], MS, [facility] Genetic Counselor [email or web address] (P) [phone]  The patient was seen for a total of 20 minutes in face-to-face genetic counseling. The patient was seen alone.  Drs. Lindi Adie and/or Burr Medico were available to discuss this case as needed.   _______________________________________________________________________ For Office Staff:  Number of people involved in session: 1 Was an Intern/ student involved with case: no

## 2022-02-28 NOTE — Progress Notes (Signed)
[place] Work  Initial Assessment   [name] is a 76 y.o. year old female accompanied by sister via phone. Clinical Social Work was referred by  [facility]  for assessment of psychosocial needs.   SDOH (Social Determinants of Health) assessments performed: Yes SDOH Interventions    Flowsheet Row Most Recent Value  SDOH Interventions   Food Insecurity Interventions Intervention Not Indicated  Financial Strain Interventions Other (Comment)  [cancer foundations]  Transportation Interventions Intervention Not Indicated       SDOH Screenings   Alcohol Screen: Not on file  Depression (PHQ2-9): Low Risk  (11/03/2021)   Depression (PHQ2-9)    PHQ-2 Score: 0  Financial Resource Strain: Medium Risk (02/28/2022)   Overall Financial Resource Strain (CARDIA)    Difficulty of Paying Living Expenses: Somewhat hard  Food Insecurity: No Food Insecurity (02/28/2022)   Hunger Vital Sign    Worried About Running Out of Food in the Last Year: Never true    Ran Out of Food in the Last Year: Never true  Housing: Not on file  Physical Activity: Not on file  Social Connections: Not on file  Stress: Not on file  Tobacco Use: Low Risk  (02/28/2022)   Patient History    Smoking Tobacco Use: Never    Smokeless Tobacco Use: Never    Passive Exposure: Not on file  Transportation Needs: No Transportation Needs (02/28/2022)   PRAPARE - Transportation    Lack of Transportation (Medical): No    Lack of Transportation (Non-Medical): No     Distress Screen completed: Yes    02/28/2022   12:16 PM  ONCBCN DISTRESS SCREENING  Screening Type Initial Screening  Distress experienced in past week (1-10) 1  Information Concerns Type Lack of info about diagnosis;Lack of info about treatment  Physical Problem type Pain;Swollen arms/legs;Skin dry/itchy      Family/Social Information:  Housing Arrangement: patient lives alone Family members/support persons in your life? Family (sisters, adopted  sister), Friends & neighbor, and Music therapist concerns: no  Employment: Retired .  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 yes Concerns about diagnosis and/or treatment: How I will pay for the services I need Patient reported stressors: Finances Current coping skills/ strengths: Capable of independent living , Religious Affiliation , and Supportive family/friends     SUMMARY: Current SDOH Barriers:  Potential financial constraints related to medical bills  Clinical Social Work Clinical Goal(s):  Patient will apply for assistance through cancer foundations as directed by CSW  Interventions: Discussed common feeling and emotions when being diagnosed with cancer, and the importance of support during treatment Informed patient of the support team roles and support services at [facility] Provided CSW contact information and encouraged patient to call with any questions or concerns Sent information/ applications for [employer] in [place]   Follow Up Plan: Patient will work on applications and return to [place] for submission / notify CSW of any questions or concerns Patient verbalizes understanding of plan: Yes    Keilen Kahl E Cristal Qadir, LCSW

## 2022-03-01 ENCOUNTER — Other Ambulatory Visit: Payer: Self-pay | Admitting: *Deleted

## 2022-03-01 ENCOUNTER — Telehealth: Payer: Self-pay | Admitting: *Deleted

## 2022-03-01 DIAGNOSIS — D0512 Intraductal carcinoma in situ of left breast: Secondary | ICD-10-CM

## 2022-03-01 NOTE — Telephone Encounter (Signed)
Inquiring about RSV vaccine and wanting to talk to you about same.

## 2022-03-02 ENCOUNTER — Telehealth: Payer: Self-pay | Admitting: Radiation Oncology

## 2022-03-02 ENCOUNTER — Telehealth: Payer: Self-pay | Admitting: Hematology and Oncology

## 2022-03-02 ENCOUNTER — Telehealth: Payer: Self-pay | Admitting: Family Medicine

## 2022-03-02 ENCOUNTER — Telehealth: Payer: Self-pay

## 2022-03-02 NOTE — Telephone Encounter (Signed)
Patient called in and wanted to speak with you in regards to a procedure she has to have. She stated she can be reached at [phone]. Thank you!

## 2022-03-02 NOTE — Telephone Encounter (Signed)
Per 8/17 in basket, pt has been called and confirmed

## 2022-03-02 NOTE — Telephone Encounter (Signed)
Pt called to advise she was diagnosed with Cancer and has radiology scheduled for the 04/25/2022. She wanted to know if she should have her follow up appt rescheduled for before that appointment or is it ok if she keeps her scheduled appt.

## 2022-03-02 NOTE — Telephone Encounter (Signed)
LVM to schedule CON with Dr. Kinard 

## 2022-03-02 NOTE — Telephone Encounter (Signed)
I believe it is okay to keep the appointment in October.

## 2022-03-05 DIAGNOSIS — J3089 Other allergic rhinitis: Secondary | ICD-10-CM | POA: Diagnosis not present

## 2022-03-05 DIAGNOSIS — J069 Acute upper respiratory infection, unspecified: Secondary | ICD-10-CM | POA: Diagnosis not present

## 2022-03-05 DIAGNOSIS — J4550 Severe persistent asthma, uncomplicated: Secondary | ICD-10-CM

## 2022-03-05 DIAGNOSIS — K219 Gastro-esophageal reflux disease without esophagitis: Secondary | ICD-10-CM | POA: Diagnosis not present

## 2022-03-05 NOTE — Telephone Encounter (Signed)
Lvm for pt to advise rescheduling her appt is not needed.

## 2022-03-06 ENCOUNTER — Ambulatory Visit: Payer: Medicare Other

## 2022-03-06 ENCOUNTER — Telehealth: Payer: Self-pay | Admitting: *Deleted

## 2022-03-06 ENCOUNTER — Encounter: Payer: Self-pay | Admitting: Allergy & Immunology

## 2022-03-06 ENCOUNTER — Ambulatory Visit (INDEPENDENT_AMBULATORY_CARE_PROVIDER_SITE_OTHER): Payer: Medicare Other | Admitting: Allergy & Immunology

## 2022-03-06 VITALS — BP 130/82 | HR 81 | Temp 97.3°F | Resp 16 | Ht 64.0 in | Wt 196.8 lb

## 2022-03-06 DIAGNOSIS — J3089 Other allergic rhinitis: Secondary | ICD-10-CM

## 2022-03-06 DIAGNOSIS — K219 Gastro-esophageal reflux disease without esophagitis: Secondary | ICD-10-CM | POA: Diagnosis not present

## 2022-03-06 DIAGNOSIS — J069 Acute upper respiratory infection, unspecified: Secondary | ICD-10-CM

## 2022-03-06 DIAGNOSIS — J4550 Severe persistent asthma, uncomplicated: Secondary | ICD-10-CM | POA: Diagnosis not present

## 2022-03-06 NOTE — Progress Notes (Signed)
FOLLOW UP  Date of Service/Encounter:  03/06/22   Assessment:   Severe persistent asthma - on Xolair   Food allergies  Allergic rhinitis   Laryngopharyngeal reflux disease  Viral URI - starting prednisone (COVID negative)  Plan/Recommendations:   1. Severe persistent asthma without complication - Lung testing not done. - We are not going to make any changes to your regimen since Dr. Neldon Mc has you under such excellent control!   2. Viral URI - COVID testing was negative. - Continue with Mucinex twice daily. - Continue with lots of water drinking. - Add on the prednisone pack. - Call us with an update in a couple of days. - We can send in antibiotics if needed, but it might just be a cold at this point in time.   3. LPRD (laryngopharyngeal reflux disease) - Continue with your reflux medications.  4. Allergic rhinitis - You might want to [place] for a few days because that might be irritating to your nose. - Continue with the nose sprays as needed.  5. Follow up as scheduled.    Subjective:   [name] is a 76 y.o. female presenting today for follow up of  Chief Complaint  Patient presents with   Cough    And tinnitus - bilateral x 1 week. Patient states this morning when she coughed -blood like tint mucous.    [name] has a history of the following: Patient Active Problem List   Diagnosis Date Noted   Family history of breast cancer 02/28/2022   Family history of colon cancer in mother 02/28/2022   Ductal carcinoma in situ (DCIS) of left breast 02/23/2022   Family history of malignant neoplasm of digestive organs 02/02/2022   Flatulence, eructation and gas pain 02/02/2022   Anemia 12/29/2021   Chronic constipation 12/29/2021   Leukocytosis 12/19/2021   Chronic anticoagulation - Xerelto 12/18/2021   Hyperthyroidism 12/18/2021   Right ankle pain 12/15/2021   Vaginal burning 12/12/2021   Status post total left knee replacement  12/09/2021   Aftercare following joint replacement 10/30/2021   Presence of right artificial knee joint 10/30/2021   Constipation by delayed colonic transit 09/25/2021   Concussion with no loss of consciousness 08/15/2021   Atypical atrial flutter ([place]) 06/24/2021   Chronic diastolic heart failure ([place]) 06/24/2021   Epistaxis 05/25/2021   Left upper arm pain 03/14/2021   Gastroenteritis 01/24/2021   Contusion of right lower leg 11/25/2020   Persistent atrial fibrillation (HCC)    SOB (shortness of breath) 10/14/2020   Severe persistent asthma with (acute) exacerbation 10/07/2020   Skin ulcer of buttock, limited to breakdown of skin ([place]) 08/05/2020   Controlled type 2 diabetes mellitus without complication ([place]) 13/24/4010   Arm pain, lateral, right 06/03/2020   Vaginal irritation 02/16/2020   High risk medication use 02/02/2020   Penicillin allergy 01/19/2020   Discitis of lumbosacral region 01/04/2020   Hyperlipidemia associated with type 2 diabetes mellitus ([place]) 05/07/2019   Chronic pain 12/11/2018   Obstructive sleep apnea treated with continuous positive airway pressure (CPAP) 06/03/2018   Encounter for chronic pain management 04/01/2018   Left thyroid nodule 02/06/2018   Insomnia secondary to chronic pain 01/26/2018   Class 3 drug-induced obesity with serious comorbidity and body mass index (BMI) of 40.0 to 44.9 in adult ([place]) 12/03/2017   Graves disease 10/15/2017   Mild obstructive sleep apnea 10/15/2017   Hot flashes not due to menopause 09/27/2017   Diarrhea 01/04/2017  Acute midline low back pain without sciatica 12/03/2016   Generalized abdominal pain 11/26/2016   Dry mouth 11/01/2016   Bilateral high frequency sensorineural hearing loss 10/24/2016   Subjective tinnitus, bilateral 10/24/2016   Candidal intertrigo 07/24/2016   Groin pain 04/27/2016   Urge incontinence 03/27/2016   Leg cramps 08/23/2015   Moderate persistent asthma 07/12/2015   Paroxysmal atrial  fibrillation ([place]) 06/03/2015   Counseling regarding end of life decision making 05/05/2015   Neuropathy due to secondary diabetes ([place]) 01/11/2015   Pain in Achilles tendon 01/11/2015   Fall 11/09/2014   Migraine headache without aura 08/24/2014   Allergic rhinitis 03/25/2014   DVT (deep venous thrombosis) hx of  03/25/2014   Osteoarthritis of right knee 03/25/2014    ? of Seizure disorder 03/25/2014   Depression, major, in remission ([place]) 03/25/2014   Diabetes mellitus with neuropathy ([place]) 03/25/2014   Essential hypertension 03/25/2014   GERD (gastroesophageal reflux disease) 03/25/2014   History of DVT (deep vein thrombosis) 03/25/2014   History of pulmonary embolism 10/09/2013   Hypercoagulable state ([place]) 01/29/2011   Asthma, moderate persistent 01/24/2011   Pulmonary hypertension ([place]) 01/11/2011    History obtained from: chart review and patient.  [name] is a 76 y.o. female presenting for a sick visit.  She was last seen in June 2023 by Dr. Neldon Mc.  At that time, she was continued on Advair 230 mcg 2 puffs 1-2 times daily as well as Spiriva 2.5 mcg 1 puff once daily.  She has Flovent that she adds during her respiratory flares.  She was also given montelukast 10 mg daily as well as Xolair.  Her reflux was controlled with pantoprazole and famotidine.  For her allergic rhinitis, she was continued on OTC Nasacort as well as Mucinex, Atrovent, and nystatin as needed.  She was doing very well at the last visit.  Her breathing was under good control and reflux was controlled as well.  In the interim, she has mostly done well.  However, she presents today because she developed bloody mucus for the past few hours.  She has not had a fever.  She denies any wheezing or chest tightness.  She does have a lot of postnasal drip with some sinus tenderness.  She is worried that is going to turn into something worse, which is why she came today.  She denies any increased wheezing.  She has not been  using her rescue inhaler any more than normal.  She remains on her Advair 2 puffs twice daily.  She did get her Xolair injection today.  She does not remember the last time she got prednisone or antibiotics.  She did get some steroids for her knee, but with regards to her breathing she has not needed that in several years.  Otherwise, there have been no changes to her past medical history, surgical history, family history, or social history.    Review of Systems  Constitutional: Negative.  Negative for fever, malaise/fatigue and weight loss.  HENT:  Positive for congestion. Negative for ear discharge and ear pain.        Positive for postnasal drip.  Eyes:  Negative for pain, discharge and redness.  Respiratory:  Positive for cough. Negative for sputum production, shortness of breath and wheezing.   Cardiovascular: Negative.  Negative for chest pain and palpitations.  Gastrointestinal:  Negative for abdominal pain, heartburn, nausea and vomiting.  Skin: Negative.  Negative for itching and rash.  Neurological:  Negative for dizziness and headaches.  Endo/Heme/Allergies:  Positive for environmental allergies. Does not bruise/bleed easily.       Objective:   Blood pressure 130/82, pulse 81, temperature (!) 97.3 F (36.3 C), resp. rate 16, height '5\' 4"'$  (1.626 m), weight 196 lb 12.8 oz (89.3 kg), SpO2 98 %. Body mass index is 33.78 kg/m.    Physical Exam Vitals reviewed.  Constitutional:      Appearance: She is well-developed.  HENT:     Head: Normocephalic and atraumatic.     Right Ear: Tympanic membrane, ear canal and external ear normal.     Left Ear: Tympanic membrane, ear canal and external ear normal.     Nose: Mucosal edema and rhinorrhea present. No nasal deformity or septal deviation.     Right Turbinates: Enlarged. Not swollen.     Left Turbinates: Enlarged. Not swollen.     Right Sinus: No maxillary sinus tenderness or frontal sinus tenderness.     Left Sinus: No  maxillary sinus tenderness or frontal sinus tenderness.     Comments: Epistaxis in the bilateral nares.    Mouth/Throat:     Mouth: Mucous membranes are not pale and not dry.     Pharynx: Uvula midline.  Eyes:     General: Lids are normal. No allergic shiner.       Right eye: No discharge.        Left eye: No discharge.     Conjunctiva/sclera: Conjunctivae normal.     Right eye: Right conjunctiva is not injected. No chemosis.    Left eye: Left conjunctiva is not injected. No chemosis.    Pupils: Pupils are equal, round, and reactive to light.  Cardiovascular:     Rate and Rhythm: Normal rate and regular rhythm.     Heart sounds: Normal heart sounds.  Pulmonary:     Effort: Pulmonary effort is normal. No tachypnea, accessory muscle usage or respiratory distress.     Breath sounds: Normal breath sounds. No wheezing, rhonchi or rales.  Chest:     Chest wall: No tenderness.  Lymphadenopathy:     Cervical: No cervical adenopathy.  Skin:    Coloration: Skin is not pale.     Findings: No abrasion, erythema, petechiae or rash. Rash is not papular, urticarial or vesicular.  Neurological:     Mental Status: She is alert.  Psychiatric:        Behavior: Behavior is cooperative.      Diagnostic studies:   COVID rapid swab: NEGATIVE     Salvatore Marvel, MD  [facility] [place]

## 2022-03-06 NOTE — Telephone Encounter (Signed)
Received  VM about whether she should start prednisone or not due to her sx.  Her sx is 9/14. Called patient back but had to leave VM for a return phone call.

## 2022-03-06 NOTE — Patient Instructions (Addendum)
1. Severe persistent asthma without complication - Lung testing not done. - We are not going to make any changes to your regimen since Dr. Neldon Mc has you under such excellent control!   2. Viral URI - COVID testing was negative. - Continue with Mucinex twice daily. - Continue with lots of water drinking. - Add on the prednisone pack. - Call us with an update in a couple of days. - We can send in antibiotics if needed, but it might just be a cold at this point in time.   3. LPRD (laryngopharyngeal reflux disease) - Continue with your reflux medications.  4. Allergic rhinitis - You might want to [place] for a few days because that might be irritating to your nose. - Continue with the nose sprays as needed.  5. Follow up as scheduled.     Please inform us of any Emergency Department visits, hospitalizations, or changes in symptoms. Call us before going to the ED for breathing or allergy symptoms since we might be able to fit you in for a sick visit. Feel free to contact us anytime with any questions, problems, or concerns.  It was a pleasure to see you today!  Websites that have reliable patient information: 1. American Academy of Asthma, Allergy, and Immunology: www.aaaai.org 2. Food Allergy Research and Education (FARE): foodallergy.org 3. Mothers of Asthmatics: http://www.asthmacommunitynetwork.org 4. American College of Allergy, Asthma, and Immunology: www.acaai.org   COVID-19 Vaccine Information can be found at: ShippingScam.co.uk For questions related to vaccine distribution or appointments, please email [email or web address] or call [phone].   We realize that you might be concerned about having an allergic reaction to the COVID19 vaccines. To help with that concern, WE ARE OFFERING THE COVID19 VACCINES IN OUR OFFICE! Ask the front desk for dates!     "Like" [place] on Facebook and Instagram for our latest  updates!      A healthy democracy works best when [employer] participate! Make sure you are registered to vote! If you have moved or changed any of your contact information, you will need to get this updated before voting!  In some cases, you MAY be able to register to vote online: [email or web address]

## 2022-03-07 ENCOUNTER — Telehealth: Payer: Self-pay | Admitting: *Deleted

## 2022-03-07 ENCOUNTER — Encounter: Payer: Self-pay | Admitting: *Deleted

## 2022-03-07 ENCOUNTER — Telehealth: Payer: Self-pay | Admitting: Family Medicine

## 2022-03-07 NOTE — Telephone Encounter (Signed)
Patient called and asked if she can get some muscle relaxer. Call back number [phone].

## 2022-03-07 NOTE — Telephone Encounter (Signed)
Please call and triage.  There are no muscle relaxants listed on her current medication list.

## 2022-03-07 NOTE — Telephone Encounter (Signed)
Left vm regarding [facility] from 02/28/22. Contact information provided for questions or needs.

## 2022-03-07 NOTE — Telephone Encounter (Signed)
I spoke with pt; pt could not find muscle relaxant bottle to ck with pharmacy about refill. I looked on pts hx med list and Dr Lorelei Pont gave pt tizanidine 4 mg in 2017; pt could not remember what was hurting then. Pt said on and off for years pt has had lt shoulder pain; no CP. Pt thought a muscle relaxant would help shoulder pain. I scheduled pt an appt to see Dr Diona Browner on 03/09/22 at 10 :53 (offered pt sooner appt with Dr Lorelei Pont but pt wants to see Dr Diona Browner). UC & ED precautions given and pt voiced understanding. Sending note to Dr Diona Browner and [name] CMA.

## 2022-03-08 NOTE — Telephone Encounter (Signed)
Noted  

## 2022-03-09 ENCOUNTER — Encounter: Payer: Self-pay | Admitting: Family Medicine

## 2022-03-09 ENCOUNTER — Ambulatory Visit (INDEPENDENT_AMBULATORY_CARE_PROVIDER_SITE_OTHER): Payer: Medicare Other | Admitting: Family Medicine

## 2022-03-09 ENCOUNTER — Encounter: Payer: Self-pay | Admitting: Allergy & Immunology

## 2022-03-09 VITALS — BP 160/80 | HR 82 | Temp 98.6°F | Ht 64.0 in | Wt 194.5 lb

## 2022-03-09 DIAGNOSIS — S46812A Strain of other muscles, fascia and tendons at shoulder and upper arm level, left arm, initial encounter: Secondary | ICD-10-CM

## 2022-03-09 DIAGNOSIS — Z23 Encounter for immunization: Secondary | ICD-10-CM

## 2022-03-09 MED ORDER — CYCLOBENZAPRINE HCL 10 MG PO TABS: 5.0000 mg | ORAL_TABLET | Freq: Every evening | ORAL | 0 refills | Status: DC | PRN

## 2022-03-09 NOTE — Patient Instructions (Signed)
Start home Physical therapy on upper back/shoulder.  Use heat and can try muscle relaxant at night as needed.

## 2022-03-09 NOTE — Progress Notes (Signed)
Patient ID: DEMI TRIEU, female    DOB: 29-Mar-1946, 76 y.o.   MRN: [id number]  This visit was conducted in person.  BP (!) 160/80   Pulse 82   Temp 98.6 F (37 C) (Oral)   Ht 5' 4" (1.626 m)   Wt 194 lb 8 oz (88.2 kg)   SpO2 96%   BMI 33.39 kg/m    CC:  Chief Complaint  Patient presents with   Shoulder Pain    Left-Requesting muscle relaxant    Subjective:   HPI: BECKHAM BUXBAUM is a 76 y.o. female presenting on 03/09/2022 for Shoulder Pain (Left-Requesting muscle relaxant)   Recent Dx of breast cancer:    Left shoulder pain... left lumpectomy planned 03/29/2022.  Followed by adjuvant radiation.  Plan 5 years antiestrogen therapy.  Dr. Brantley Stage, Surgeon  Oncology: Dr. Chryl Heck   Left shoulder pain, acute.  In last week.  No falls, no change in activity.  No numbness or weakness in arm.  Pain starts in left neck, worse with moving arm.  In past she has used muscle relaxants, tizanadine.  Relevant past medical, surgical, family and social history reviewed and updated as indicated. Interim medical history since our last visit reviewed. Allergies and medications reviewed and updated. Outpatient Medications Prior to Visit  Medication Sig Dispense Refill   albuterol (VENTOLIN HFA) 108 (90 Base) MCG/ACT inhaler INHALE 2 PUFFS BY MOUTH EVERY 6 HOURS AS NEEDED FOR WHEEZING OR SHORTNESS OF BREATH 18 g 1   atorvastatin (LIPITOR) 10 MG tablet TAKE 1 TABLET(10 MG) BY MOUTH DAILY 90 tablet 3   Blood Glucose Monitoring Suppl (ONETOUCH VERIO) w/Device KIT Use to check blood sugar up to 2 times a day 1 kit 0   Empagliflozin-linaGLIPtin (GLYXAMBI) 10-5 MG TABS Take 1 tablet by mouth daily. 30 tablet 5   EPINEPHrine 0.3 mg/0.3 mL IJ SOAJ injection USE AS DIRECTED FOR LIFE THREATENING ALLERGIC REACTIONS 2 each 2   estradiol (ESTRACE) 0.1 MG/GM vaginal cream 0.5 g of cream intravaginally administered daily for 2 weeks, then reduce to twice weekly 42.5 g 12   famotidine (PEPCID) 40 MG  tablet Take 1 (ONE) tablet by mouth every evening. 90 tablet 1   fluticasone (FLOVENT HFA) 220 MCG/ACT inhaler 2 puffs 2 times daily during [place]. (Patient taking differently: Inhale 2 puffs into the lungs 2 (two) times daily as needed (shortness of breath/wheezing).) 12 g 5   furosemide (LASIX) 20 MG tablet Take 20 mg as needed for a weight gain of 3 pounds overnight or 5 pounds in one week,. 30 tablet 3   gabapentin (NEURONTIN) 600 MG tablet TAKE 1 TABLET BY MOUTH EVERY DAY AT BREAKFAST, 1 TABLET AT LUNCH AND 2 TABLETS AT BEDTIME 360 tablet 1   glucose blood (ONETOUCH VERIO) test strip 1 strip 2 (two) times daily     Homeopathic Products (THERAWORX RELIEF EX) Apply 1 spray topically daily as needed (muscle cramps).     hydrOXYzine (ATARAX/VISTARIL) 10 MG tablet Take 1 tablet (10 mg total) by mouth daily as needed for itching. 30 tablet 2   ipratropium (ATROVENT) 0.06 % nasal spray 1-2 sprays in each nostril 1-2 times per day as needed to dry nose 15 mL 5   irbesartan (AVAPRO) 150 MG tablet Take 1 tablet (150 mg total) by mouth daily. 90 tablet 2   Lactase (DAIRY-RELIEF PO) Take 1 capsule by mouth daily as needed (eating dairy).      Lancets (ONETOUCH DELICA PLUS [id number])  MISC USE TO CHECK BLOOD SUGAR UP TO TWICE DAILY 100 each 5   levalbuterol (XOPENEX) 1.25 MG/3ML nebulizer solution USE 1 VIAL VIA NEBULIZER EVERY 6 HOURS AS NEEDED FOR SHORTNESS OF BREATH OR WHEEZING 900 mL 1   liver oil-zinc oxide (DESITIN) 40 % ointment Apply 1 application topically as needed for irritation.     Menthol-Methyl Salicylate (SALONPAS JET SPRAY EX) Apply 1 spray topically daily as needed (knee pain).     methimazole (TAPAZOLE) 5 MG tablet Take 1 tablet (5 mg total) by mouth daily. 90 tablet 3   montelukast (SINGULAIR) 10 MG tablet Take 1 tablet (10 mg total) by mouth at bedtime. For asthma control. 90 tablet 1   nystatin (MYCOSTATIN) 100000 UNIT/ML suspension 85m swish and swallow after Advair 2 times daily. 473  mL 5   Olopatadine HCl (PATADAY OP) Place 1 drop into both eyes daily.     ondansetron (ZOFRAN-ODT) 8 MG disintegrating tablet Take 1 tablet (8 mg total) by mouth every 8 (eight) hours as needed for nausea or vomiting. 20 tablet 0   ONETOUCH VERIO test strip USE TO CHECK BLOOD SUGAR UP TO TWICE DAILY AS DIRECTED 100 strip 5   oxybutynin (DITROPAN-XL) 10 MG 24 hr tablet Take 10 mg by mouth at bedtime.     pantoprazole (PROTONIX) 40 MG tablet Take 1 (ONE) tablet by mouth every morning. 90 tablet 1   Plecanatide (TRULANCE) 3 MG TABS Take 3 mg by mouth daily as needed (constipation).     Polyethyl Glycol-Propyl Glycol 0.4-0.3 % SOLN Place 1 drop into both eyes daily.     potassium chloride SA (KLOR-CON) 20 MEQ tablet Take 1 tablet (20 mEq total) by mouth as needed (Take one daily when you need to take a Furosemide). 30 tablet 0   PRESCRIPTION MEDICATION Inhale into the lungs at bedtime. CPAP     tolterodine (DETROL LA) 4 MG 24 hr capsule Take 4 mg by mouth at bedtime.     traMADol (ULTRAM) 50 MG tablet Take 1-2 tablets (50-100 mg total) by mouth every 6 (six) hours as needed for moderate pain or severe pain (50 mg for moderate, 100 mg for severe). 20 tablet 0   TURMERIC-GINGER PO Take 2 tablets by mouth every morning. chewables     XARELTO 20 MG TABS tablet TAKE 1 TABLET(20 MG) BY MOUTH DAILY WITH SUPPER 90 tablet 1   Facility-Administered Medications Prior to Visit  Medication Dose Route Frequency Provider Last Rate Last Admin   omalizumab (Arvid Right injection 300 mg  300 mg Subcutaneous Q28 days Kozlow, EDonnamarie Poag MD   300 mg at 03/06/22 1043     Per HPI unless specifically indicated in ROS section below Review of Systems  Constitutional:  Negative for fatigue and fever.  HENT:  Negative for congestion.   Eyes:  Negative for pain.  Respiratory:  Negative for cough and shortness of breath.   Cardiovascular:  Negative for chest pain, palpitations and leg swelling.  Gastrointestinal:  Negative for  abdominal pain.  Genitourinary:  Negative for dysuria and vaginal bleeding.  Musculoskeletal:  Negative for back pain.  Neurological:  Negative for syncope, light-headedness and headaches.  Psychiatric/Behavioral:  Negative for dysphoric mood.    Objective:  BP (!) 160/80   Pulse 82   Temp 98.6 F (37 C) (Oral)   Ht 5' 4" (1.626 m)   Wt 194 lb 8 oz (88.2 kg)   SpO2 96%   BMI 33.39 kg/m   Wt Readings from  Last 3 Encounters:  03/09/22 194 lb 8 oz (88.2 kg)  03/06/22 196 lb 12.8 oz (89.3 kg)  02/28/22 194 lb 14.4 oz (88.4 kg)      Physical Exam Constitutional:      General: She is not in acute distress.    Appearance: Normal appearance. She is well-developed. She is not ill-appearing or toxic-appearing.  HENT:     Head: Normocephalic.     Right Ear: Hearing, tympanic membrane, ear canal and external ear normal. Tympanic membrane is not erythematous, retracted or bulging.     Left Ear: Hearing, tympanic membrane, ear canal and external ear normal. Tympanic membrane is not erythematous, retracted or bulging.     Nose: No mucosal edema or rhinorrhea.     Right Sinus: No maxillary sinus tenderness or frontal sinus tenderness.     Left Sinus: No maxillary sinus tenderness or frontal sinus tenderness.     Mouth/Throat:     Pharynx: Uvula midline.  Eyes:     General: Lids are normal. Lids are everted, no foreign bodies appreciated.     Conjunctiva/sclera: Conjunctivae normal.     Pupils: Pupils are equal, round, and reactive to light.  Neck:     Thyroid: No thyroid mass or thyromegaly.     Vascular: No carotid bruit.     Trachea: Trachea normal.  Cardiovascular:     Rate and Rhythm: Normal rate and regular rhythm.     Pulses: Normal pulses.     Heart sounds: Normal heart sounds, S1 normal and S2 normal. No murmur heard.    No friction rub. No gallop.  Pulmonary:     Effort: Pulmonary effort is normal. No tachypnea or respiratory distress.     Breath sounds: Normal breath  sounds. No decreased breath sounds, wheezing, rhonchi or rales.  Abdominal:     General: Bowel sounds are normal.     Palpations: Abdomen is soft.     Tenderness: There is no abdominal tenderness.  Musculoskeletal:     Cervical back: Normal range of motion and neck supple.  Skin:    General: Skin is warm and dry.     Findings: No rash.  Neurological:     Mental Status: She is alert.  Psychiatric:        Mood and Affect: Mood is not anxious or depressed.        Speech: Speech normal.        Behavior: Behavior normal. Behavior is cooperative.        Thought Content: Thought content normal.        Judgment: Judgment normal.      Results for orders placed or performed in visit on 02/28/22  CBC with Differential (Cancer Center Only)  Result Value Ref Range   WBC Count 6.5 4.0 - 10.5 K/uL   RBC 4.92 3.87 - 5.11 MIL/uL   Hemoglobin 12.2 12.0 - 15.0 g/dL   HCT 36.7 36.0 - 46.0 %   MCV 74.6 (L) 80.0 - 100.0 fL   MCH 24.8 (L) 26.0 - 34.0 pg   MCHC 33.2 30.0 - 36.0 g/dL   RDW 16.9 (H) 11.5 - 15.5 %   Platelet Count 253 150 - 400 K/uL   nRBC 0.0 0.0 - 0.2 %   Neutrophils Relative % 39 %   Neutro Abs 2.5 1.7 - 7.7 K/uL   Lymphocytes Relative 46 %   Lymphs Abs 3.0 0.7 - 4.0 K/uL   Monocytes Relative 11 %   Monocytes Absolute 0.7  0.1 - 1.0 K/uL   Eosinophils Relative 2 %   Eosinophils Absolute 0.1 0.0 - 0.5 K/uL   Basophils Relative 1 %   Basophils Absolute 0.1 0.0 - 0.1 K/uL   Immature Granulocytes 1 %   Abs Immature Granulocytes 0.03 0.00 - 0.07 K/uL  Genetic Screening Order  Result Value Ref Range   Genetic Screening Order Collected by Laboratory   CMP ([facility] only)  Result Value Ref Range   Sodium 138 135 - 145 mmol/L   Potassium 4.4 3.5 - 5.1 mmol/L   Chloride 106 98 - 111 mmol/L   CO2 29 22 - 32 mmol/L   Glucose, Bld 99 70 - 99 mg/dL   BUN 16 8 - 23 mg/dL   Creatinine 1.03 (H) 0.44 - 1.00 mg/dL   Calcium 9.1 8.9 - 10.3 mg/dL   Total Protein 7.0 6.5 - 8.1 g/dL    Albumin 4.0 3.5 - 5.0 g/dL   AST 16 15 - 41 U/L   ALT 10 0 - 44 U/L   Alkaline Phosphatase 80 38 - 126 U/L   Total Bilirubin 0.8 0.3 - 1.2 mg/dL   GFR, Estimated 57 (L) >60 mL/min   Anion gap 3 (L) 5 - 15   *Note: Due to a large number of results and/or encounters for the requested time period, some results have not been displayed. A complete set of results can be found in Results Review.     COVID 19 screen:  No recent travel or known exposure to COVID19 The patient denies respiratory symptoms of COVID 19 at this time. The importance of social distancing was discussed today.   Assessment and Plan Problem List Items Addressed This Visit     Strain of left trapezius muscle - Primary    Acute, no indication for imaging.  No falls. Most consistent with trapezius muscle strain.  We will treat with heat gentle stretching, and muscle relaxant. Follow-up if not improving in 2 weeks      Other Visit Diagnoses     Need for influenza vaccination       Relevant Orders   Flu Vaccine QUAD 6+ mos PF IM (Fluarix Quad PF) (Completed)          [name], MD

## 2022-03-13 ENCOUNTER — Inpatient Hospital Stay
Admission: RE | Admit: 2022-03-13 | Discharge: 2022-03-13 | Disposition: A | Discharge status: Home / Self Care | Payer: Self-pay | Source: Ambulatory Visit | Attending: Radiation Oncology | Admitting: Radiation Oncology

## 2022-03-13 ENCOUNTER — Other Ambulatory Visit: Payer: Self-pay | Admitting: Radiation Oncology

## 2022-03-13 ENCOUNTER — Ambulatory Visit
Admission: RE | Admit: 2022-03-13 | Discharge: 2022-03-13 | Disposition: A | Discharge status: Home / Self Care | Payer: Self-pay | Source: Ambulatory Visit | Attending: Radiation Oncology | Admitting: Radiation Oncology

## 2022-03-13 ENCOUNTER — Telehealth: Payer: Self-pay

## 2022-03-13 DIAGNOSIS — D0512 Intraductal carcinoma in situ of left breast: Secondary | ICD-10-CM

## 2022-03-13 NOTE — Telephone Encounter (Signed)
[facility] - Client TELEPHONE ADVICE RECORD [facility] Patient Name: [name] Gender: Female DOB: Aug 02, 1945 Age: 76 Y 13 M 16 D Return Phone Number: [phone] (Primary) Address: City/ State/ Zip: [name]  [address] Client  [facility] - Client Client Site [place] Provider [name] - MD Contact Type Call Who Is Calling Patient / Member / Family / Caregiver Call Type Triage / Clinical Relationship To Patient Self Return Phone Number [phone] (Primary) Chief Complaint Dizziness Reason for Call Symptomatic / Request for [place] states her sugar is 151 and she feels dizzy. Translation No Nurse Assessment Nurse: Rock Nephew, RN, Megan Date/Time (Eastern Time): 03/13/2022 2:38:09 AM Confirm and document reason for call. If symptomatic, describe symptoms. ---Caller states her sugar is 151 and she feels dizzy. Caller reports feeling dizzy went in to check sugar and it was 151. Caller reports laid down and reports doesn't feel as dizzy. medication she take for dizziness is meclizine Does the patient have any new or worsening symptoms? ---Yes Will a triage be completed? ---Yes Related visit to physician within the last 2 weeks? ---No Does the PT have any chronic conditions? (i.e. diabetes, asthma, this includes High risk factors for pregnancy, etc.) ---Yes List chronic conditions. ---dizziness, DM, surgery for breast in [place], stage 1. Surgery Sep 14th. Is this a behavioral health or substance abuse call? ---No Guidelines Guideline Title Affirmed Question Affirmed Notes Nurse Date/Time (Eastern Time) Dizziness - Lightheadedness [1] MODERATE dizziness (e.g., interferes with normal activities) AND [2] has NOT been evaluated by doctor (or NP/PA) for this (Exception: Dizziness caused by Rock Nephew, RN, Megan 03/13/2022 2:40:52 AM PLEASE  NOTE: All timestamps contained within this report are represented as [place] Standard Time. CONFIDENTIALTY NOTICE: This fax transmission is intended only for the addressee. It contains information that is legally privileged, confidential or otherwise protected from use or disclosure. If you are not the intended recipient, you are strictly prohibited from reviewing, disclosing, copying using or disseminating any of this information or taking any action in reliance on or regarding this information. If you have received this fax in error, please notify us immediately by telephone so that we can arrange for its return to [place]. Phone: [phone], Toll-Free: [phone], Fax: [phone] Page: 2 of 2 Call Id: [id number] Guidelines Guideline Title Affirmed Question Affirmed Notes Nurse Date/Time Eilene Ghazi Time) heat exposure, sudden standing, or poor fluid intake.) Disp. Time Eilene Ghazi Time) Disposition Final User 03/13/2022 2:42:42 AM See PCP within 24 Hours Yes Rock Nephew, RN, Jinny Blossom Final Disposition 03/13/2022 2:42:42 AM See PCP within 24 Hours Yes Rock Nephew, RN, Jinny Blossom Caller Disagree/Comply Comply Caller Understands Yes PreDisposition InappropriateToAsk Care Advice Given Per Guideline SEE PCP WITHIN 24 HOURS: * IF OFFICE WILL BE OPEN: You need to be examined within the next 24 hours. Call your doctor (or NP/PA) when the office opens and make an appointment. CALL BACK IF: * You become worse Referrals REFERRED TO PCP OFFIC

## 2022-03-13 NOTE — Progress Notes (Unsigned)
PATIENT: [name] DOB: 1946/01/31  REASON FOR VISIT: follow up HISTORY FROM: patient  Chief Complaint  Patient presents with   RM 19    Here with her neighbor for CPAP f/u. She states it is working pretty good when she can use it. She has difficulty sometimes due to back pain or frequent trips to the bathroom. ESS 5. She was just diagnosed with breast cancer and she will have surgery on 03/29/22.     HISTORY OF PRESENT ILLNESS: Today 03/14/22:  [name] is a 76 year old female with a history of obstructive sleep apnea on CPAP.  She returns today for follow-up.  She reports that there are some nights that she does not use the machine due to frequent trips to the bathroom.  Otherwise she does notice the benefit when she uses the machine.  She was recently diagnosed with breast cancer and will have surgery later this month.  Her download is below    12/22/20: [name] is a 76 year old female with a history of obstructive sleep apnea on CPAP.  She reports that the CPAP is working well for her.  She denies any new issues.  She joins me today for follow-up.    04/13/20: [name] is a 76 year old female with a history of obstructive sleep apnea on CPAP.  Her download indicates that she used the machine 22 out of 30 days for compliance of 73%.  She used her machine greater than 4 hours 17 days for compliance of 57%.  On average she uses her machine 5 hours and 6 minutes.  Her residual AHI is 1.3 on 5 to 10 cm of water with EPR of 3.  Her leak in the 95th percentile is 31.6 L/min. She returns today for follow-up.  HISTORY 03/05/19:   [name] is a 75 year old female with a history of obstructive sleep apnea on CPAP.  Her download indicates that she used the machine 22 out of 30 days for compliance of 73%.  She used her machine greater than 4 hours 21 days for compliance of 70%.  On average she uses her machine 6 hours and 5 minutes.  Her residual AHI is 1.6 on 5 to 10 cm of water  with EPR of 3.  Her leak in the 95th percentile is 29.7 L/min.  REVIEW OF SYSTEMS: Out of a complete 14 system review of symptoms, the patient complains only of the following symptoms, and all other reviewed systems are negative.  ESS 5  ALLERGIES: Allergies  Allergen Reactions   Dilantin [Phenytoin] Swelling    Other reaction(s): facial swelling   Latex Hives   Oxycodone Nausea And Vomiting and Nausea Only    Other reaction(s): Abdominal Pain, Vomiting   Penicillins Swelling    Has patient had a PCN reaction causing immediate rash, facial/tongue/throat swelling, SOB or lightheadedness with hypotension patient had a PCN reaction causing severe rash involving mucus membranes or skin necrosis: [id number] Has patient had a PCN reaction that required hospitalization/No Has patient had a PCN reaction occurring within the last 10 years: No If all of the above answers are "NO", then may proceed with Cephalosporin use.    Other reaction(s): vomiting   Phenytoin Sodium Extended Swelling   Codeine Nausea Only    Tolerates tylenol with codeine   Hydrocodone Nausea And Vomiting   Nickel Hives and Rash   Pregabalin Other (See Comments)    Blurred vision. Other reaction(s): headaches/problem w/vision    HOME MEDICATIONS: Outpatient Medications  Prior to Visit  Medication Sig Dispense Refill   albuterol (VENTOLIN HFA) 108 (90 Base) MCG/ACT inhaler INHALE 2 PUFFS BY MOUTH EVERY 6 HOURS AS NEEDED FOR WHEEZING OR SHORTNESS OF BREATH 18 g 1   atorvastatin (LIPITOR) 10 MG tablet TAKE 1 TABLET(10 MG) BY MOUTH DAILY 90 tablet 3   Blood Glucose Monitoring Suppl (ONETOUCH VERIO) w/Device KIT Use to check blood sugar up to 2 times a day 1 kit 0   cyclobenzaprine (FLEXERIL) 10 MG tablet Take 0.5-1 tablets (5-10 mg total) by mouth at bedtime as needed for muscle spasms. 20 tablet 0   Empagliflozin-linaGLIPtin (GLYXAMBI) 10-5 MG TABS Take 1 tablet by mouth daily. 30 tablet 5   EPINEPHrine 0.3 mg/0.3  mL IJ SOAJ injection USE AS DIRECTED FOR LIFE THREATENING ALLERGIC REACTIONS 2 each 2   estradiol (ESTRACE) 0.1 MG/GM vaginal cream 0.5 g of cream intravaginally administered daily for 2 weeks, then reduce to twice weekly 42.5 g 12   famotidine (PEPCID) 40 MG tablet Take 1 (ONE) tablet by mouth every evening. 90 tablet 1   fluticasone (FLOVENT HFA) 220 MCG/ACT inhaler Inhale 2 puffs into the lungs 2 (two) times daily as needed.     gabapentin (NEURONTIN) 600 MG tablet TAKE 1 TABLET BY MOUTH EVERY DAY AT BREAKFAST, 1 TABLET AT LUNCH AND 2 TABLETS AT BEDTIME 360 tablet 1   glucose blood (ONETOUCH VERIO) test strip 1 strip 2 (two) times daily     Homeopathic Products (THERAWORX RELIEF EX) Apply 1 spray topically daily as needed (muscle cramps).     hydrOXYzine (ATARAX/VISTARIL) 10 MG tablet Take 1 tablet (10 mg total) by mouth daily as needed for itching. 30 tablet 2   ipratropium (ATROVENT) 0.06 % nasal spray 1-2 sprays in each nostril 1-2 times per day as needed to dry nose 15 mL 5   irbesartan (AVAPRO) 150 MG tablet Take 1 tablet (150 mg total) by mouth daily. 90 tablet 2   Lactase (DAIRY-RELIEF PO) Take 1 capsule by mouth daily as needed (eating dairy).      Lancets (ONETOUCH DELICA PLUS LANCET33G) MISC USE TO CHECK BLOOD SUGAR UP TO TWICE DAILY 100 each 5   levalbuterol (XOPENEX) 1.25 MG/3ML nebulizer solution USE 1 VIAL VIA NEBULIZER EVERY 6 HOURS AS NEEDED FOR SHORTNESS OF BREATH OR WHEEZING 900 mL 1   liver oil-zinc oxide (DESITIN) 40 % ointment Apply 1 application topically as needed for irritation.     Menthol-Methyl Salicylate (SALONPAS JET SPRAY EX) Apply 1 spray topically daily as needed (knee pain).     methimazole (TAPAZOLE) 5 MG tablet Take 1 tablet (5 mg total) by mouth daily. 90 tablet 3   montelukast (SINGULAIR) 10 MG tablet Take 1 tablet (10 mg total) by mouth at bedtime. For asthma control. 90 tablet 1   nystatin (MYCOSTATIN) 100000 UNIT/ML suspension 5mL swish and swallow after  Advair 2 times daily. 473 mL 5   Olopatadine HCl (PATADAY OP) Place 1 drop into both eyes daily.     ondansetron (ZOFRAN-ODT) 8 MG disintegrating tablet Take 1 tablet (8 mg total) by mouth every 8 (eight) hours as needed for nausea or vomiting. 20 tablet 0   ONETOUCH VERIO test strip USE TO CHECK BLOOD SUGAR UP TO TWICE DAILY AS DIRECTED 100 strip 5   oxybutynin (DITROPAN-XL) 10 MG 24 hr tablet Take 10 mg by mouth at bedtime.     pantoprazole (PROTONIX) 40 MG tablet Take 1 (ONE) tablet by mouth every morning. 90 tablet 1  Plecanatide (TRULANCE) 3 MG TABS Take 3 mg by mouth daily as needed (constipation).     Polyethyl Glycol-Propyl Glycol 0.4-0.3 % SOLN Place 1 drop into both eyes daily.     potassium chloride SA (KLOR-CON) 20 MEQ tablet Take 1 tablet (20 mEq total) by mouth as needed (Take one daily when you need to take a Furosemide). 30 tablet 0   PRESCRIPTION MEDICATION Inhale into the lungs at bedtime. CPAP     tolterodine (DETROL LA) 4 MG 24 hr capsule Take 4 mg by mouth at bedtime.     traMADol (ULTRAM) 50 MG tablet Take 1-2 tablets (50-100 mg total) by mouth every 6 (six) hours as needed for moderate pain or severe pain (50 mg for moderate, 100 mg for severe). 20 tablet 0   TURMERIC-GINGER PO Take 2 tablets by mouth every morning. chewables     XARELTO 20 MG TABS tablet TAKE 1 TABLET(20 MG) BY MOUTH DAILY WITH SUPPER 90 tablet 1   furosemide (LASIX) 20 MG tablet Take 20 mg as needed for a weight gain of 3 pounds overnight or 5 pounds in one week,. (Patient not taking: Reported on 03/14/2022) 30 tablet 3   Facility-Administered Medications Prior to Visit  Medication Dose Route Frequency Provider Last Rate Last Admin   omalizumab Geoffry Paradise) injection 300 mg  300 mg Subcutaneous Q28 days Jessica Priest, MD   300 mg at 03/06/22 1043    PAST MEDICAL HISTORY: Past Medical History:  Diagnosis Date   Allergic rhinitis    Allergy    Arthritis    Asthma    Breast cancer (HCC)    Chronic  headache    Diabetes mellitus    Ductal carcinoma in situ (DCIS) of left breast 02/23/2022   DVT (deep venous thrombosis) (HCC)    Dyspnea    Heart murmur    Hypertension    Penicillin allergy 01/19/2020   Pulmonary embolism (HCC)    Pulmonary hypertension (HCC)    Seizures (HCC)     PAST SURGICAL HISTORY: Past Surgical History:  Procedure Laterality Date   ABDOMINAL HYSTERECTOMY     partial, has ovaries   CARDIAC CATHETERIZATION  12/28/2010   Mod. pulmonary hypertension, normal coronary arteries   CARDIOVERSION N/A 11/23/2020   Procedure: CARDIOVERSION;  Surgeon: Thurmon Fair, MD;  Location: [facility];  Service: Cardiovascular;  Laterality: N/A;   CHOLECYSTECTOMY N/A 12/20/2021   Procedure: LAPAROSCOPIC CHOLECYSTECTOMY WITH INTRAOPERATIVE CHOLANGIOGRAM;  Surgeon: Darnell Level, MD;  Location: [facility];  Service: General;  Laterality: N/A;   DOPPLER ECHOCARDIOGRAPHY  10/08/2011   EF=>55%,mild asymmetric LVH, mod. TR, mod. PH, mild to mod LA dilatation   IR LUMBAR DISC ASPIRATION W/IMG GUIDE  01/12/2020   KNEE ARTHROSCOPY Left    KNEE SURGERY     Nuclear Stress Test  05/20/2006   No ischemia   PARTIAL HYSTERECTOMY     PLANTAR FASCIA SURGERY     TONSILLECTOMY      FAMILY HISTORY: Family History  Problem Relation Age of Onset   Hypertension Mother    Clotting disorder Mother    Breast cancer Mother 18   Arthritis Mother    Stroke Mother    Diabetes Mother    Colon cancer Mother 70 - 41   Alcohol abuse Father    Breast cancer Half-Sister 5       recurrence at 62, reports positive genetic testing   Arthritis Half-Sister    Diabetes Half-Sister    Colon cancer Half-Sister 9  Multiple sclerosis Sister    Prostate cancer Half-Brother    Stomach cancer Half-Brother    Allergies Other        grandson   Allergic rhinitis Neg Hx    Angioedema Neg Hx    Asthma Neg Hx    Atopy Neg Hx    Eczema Neg Hx    Immunodeficiency Neg Hx    Urticaria Neg Hx     SOCIAL  HISTORY: Social History   Socioeconomic History   Marital status: Widowed    Spouse name: Not on file   Number of children: Y   Years of education: Not on file   Highest education level: Not on file  Occupational History   Occupation: retired Programme researcher, broadcasting/film/video.   Tobacco Use   Smoking status: Never   Smokeless tobacco: Never  Vaping Use   Vaping Use: Never used  Substance and Sexual Activity   Alcohol use: Not Currently    Alcohol/week: 0.0 standard drinks of alcohol    Comment: occ glass on wine   Drug use: No   Sexual activity: Never  Other Topics Concern   Not on file  Social History Narrative   Widow    limited exercise.   Social Determinants of Health   Financial Resource Strain: Medium Risk (02/28/2022)   Overall Financial Resource Strain (CARDIA)    Difficulty of Paying Living Expenses: Somewhat hard  Food Insecurity: No Food Insecurity (02/28/2022)   Hunger Vital Sign    Worried About Running Out of Food in the Last Year: Never true    Ran Out of Food in the Last Year: Never true  Transportation Needs: No Transportation Needs (02/28/2022)   PRAPARE - Administrator, Civil Service (Medical): No    Lack of Transportation (Non-Medical): No  Physical Activity: Not on file  Stress: Not on file  Social Connections: Not on file  Intimate Partner Violence: Not on file      PHYSICAL EXAM  Vitals:   03/14/22 1029  BP: (!) 125/59  Pulse: (!) 57  Weight: 196 lb (88.9 kg)  Height: 5\' 4"  (1.626 m)    Body mass index is 33.64 kg/m.  Generalized: Well developed, in no acute distress  Chest: Lungs clear to auscultation bilaterally  Neurological examination  Mentation: Alert oriented to time, place, history taking. Follows all commands speech and language fluent Cranial nerve II-XII: Extraocular movements were full, visual field were full on confrontational test Head turning and shoulder shrug  were normal and symmetric. Motor: The motor testing reveals  5 over 5 strength of all 4 extremities. Good symmetric motor tone is noted throughout.  Sensory: Sensory testing is intact to soft touch on all 4 extremities. No evidence of extinction is noted.  Gait and station: Gait is normal.    DIAGNOSTIC DATA (LABS, IMAGING, TESTING) - I reviewed patient records, labs, notes, testing and imaging myself where available.  Lab Results  Component Value Date   WBC 6.5 02/28/2022   HGB 12.2 02/28/2022   HCT 36.7 02/28/2022   MCV 74.6 (L) 02/28/2022   PLT 253 02/28/2022      Component Value Date/Time   NA 138 02/28/2022 0824   NA 140 11/21/2020 1026   K 4.4 02/28/2022 0824   CL 106 02/28/2022 0824   CO2 29 02/28/2022 0824   GLUCOSE 99 02/28/2022 0824   BUN 16 02/28/2022 0824   BUN 16 11/21/2020 1026   CREATININE 1.03 (H) 02/28/2022 0824   CREATININE 1.01 (  H) 12/30/2020 1635   CALCIUM 9.1 02/28/2022 0824   PROT 7.0 02/28/2022 0824   ALBUMIN 4.0 02/28/2022 0824   AST 16 02/28/2022 0824   ALT 10 02/28/2022 0824   ALKPHOS 80 02/28/2022 0824   BILITOT 0.8 02/28/2022 0824   GFRNONAA 57 (L) 02/28/2022 0824   GFRNONAA 53 (L) 01/04/2020 1059   GFRAA 62 01/04/2020 1059   Lab Results  Component Value Date   CHOL 120 04/27/2021   HDL 61.00 04/27/2021   LDLCALC 49 04/27/2021   TRIG 49.0 04/27/2021   CHOLHDL 2 04/27/2021   Lab Results  Component Value Date   HGBA1C 5.3 11/03/2021   Lab Results  Component Value Date   VITAMINB12 776 01/12/2021   Lab Results  Component Value Date   TSH 0.64 10/31/2021      ASSESSMENT AND PLAN 76 y.o. year old female  has a past medical history of Allergic rhinitis, Allergy, Arthritis, Asthma, Breast cancer (HCC), Chronic headache, Diabetes mellitus, Ductal carcinoma in situ (DCIS) of left breast (02/23/2022), DVT (deep venous thrombosis) (HCC), Dyspnea, Heart murmur, Hypertension, Penicillin allergy (01/19/2020), Pulmonary embolism (HCC), Pulmonary hypertension (HCC), and Seizures (HCC). here  with:  OSA on CPAP  - CPAP compliance suboptimal - Good treatment of AHI  - Encourage patient to use CPAP nightly and > 4 hours each night - F/U in 1 year or sooner if needed    [name], MSN, NP-C 03/14/2022, 10:28 AM [facility] [address], [address] [place], [place] [address] [phone]

## 2022-03-13 NOTE — Telephone Encounter (Signed)
Noted, agree with plan.

## 2022-03-13 NOTE — Telephone Encounter (Signed)
I spoke with pt; pt said she did have lightheadedness earlier and pt took meclizine and feels OK now. Pt did not want to schedule appt and pt said she would cb for appt if needed. UC & ED precautions given and pt voiced understanding. Sending note to Dr Diona Browner and [name] CMA.

## 2022-03-14 ENCOUNTER — Ambulatory Visit (INDEPENDENT_AMBULATORY_CARE_PROVIDER_SITE_OTHER): Payer: Medicare Other | Admitting: Adult Health

## 2022-03-14 ENCOUNTER — Encounter: Payer: Self-pay | Admitting: Adult Health

## 2022-03-14 VITALS — BP 125/59 | HR 57 | Ht 64.0 in | Wt 196.0 lb

## 2022-03-14 DIAGNOSIS — Z9989 Dependence on other enabling machines and devices: Secondary | ICD-10-CM

## 2022-03-14 DIAGNOSIS — G4733 Obstructive sleep apnea (adult) (pediatric): Secondary | ICD-10-CM

## 2022-03-14 NOTE — Patient Instructions (Signed)
Continue using CPAP nightly and greater than 4 hours each night °If your symptoms worsen or you develop new symptoms please let us know.  ° °

## 2022-03-16 ENCOUNTER — Encounter: Payer: Self-pay | Admitting: Genetic Counselor

## 2022-03-16 ENCOUNTER — Telehealth: Payer: Self-pay | Admitting: Genetic Counselor

## 2022-03-16 DIAGNOSIS — Z1379 Encounter for other screening for genetic and chromosomal anomalies: Secondary | ICD-10-CM | POA: Insufficient documentation

## 2022-03-16 NOTE — Telephone Encounter (Signed)
I contacted [name] to discuss her genetic testing results. No pathogenic variants were identified in the 47 genes analyzed. Detailed clinic note to follow.  The test report has been scanned into EPIC and is located under the Molecular Pathology section of the Results Review tab.  A portion of the result report is included below for reference.   [name], MS, [name] Genetic Counselor [email or web address] (P) [phone]

## 2022-03-20 ENCOUNTER — Other Ambulatory Visit: Payer: Self-pay | Admitting: Cardiovascular Disease

## 2022-03-20 ENCOUNTER — Telehealth: Payer: Self-pay | Admitting: Allergy and Immunology

## 2022-03-20 DIAGNOSIS — I480 Paroxysmal atrial fibrillation: Secondary | ICD-10-CM

## 2022-03-20 NOTE — Telephone Encounter (Addendum)
Patient states she is getting ready to go into the hospital for a procedure. The hospital has a couple of questions regarding her med list (inhalers). She wants to speak directly with Dr. Neldon Mc regarding this. She would like a call back today [phone]

## 2022-03-21 ENCOUNTER — Ambulatory Visit: Payer: Self-pay | Admitting: Genetic Counselor

## 2022-03-21 NOTE — Progress Notes (Signed)
HPI:   [name] was previously seen in the [facility] clinic due to a personal and family history of cancer and concerns regarding a hereditary predisposition to cancer. Please refer to our prior cancer genetics clinic note for more information regarding our discussion, assessment and recommendations, at the time. [name]'s recent genetic test results were disclosed to her, as were recommendations warranted by these results. These results and recommendations are discussed in more detail below.  CANCER HISTORY:  Oncology History  Ductal carcinoma in situ (DCIS) of left breast  02/02/2022 Mammogram   Screening mammogram showed asymmetry in the left breast which is indeterminate.  Diagnostic mammogram and ultrasound was recommended.  Unilateral diagnostic mammogram showed a stable 1.1 cm oval mass in the left breast upper outer aspect middle depth, 6.5 cm from the nipple.  No other significant masses or calcifications were seen. [place] recommended. Stable 1.1 cm oval mass in the left breast upper outer aspect middle depth has been stable since at least 2018 and is consistent with a fibroadenoma and is benign.   02/19/2022 Pathology Results   Pathology from left breast needle core biopsy showed DCIS, intermediate grade, cannot rule focal microinvasion, no evidence of necrosis or calcifications, estrogen and progesterone unsuccessful on the biopsy specimen due to the neoplasm not present at the deeper levels   02/23/2022 Initial Diagnosis   Ductal carcinoma in situ (DCIS) of left breast    Genetic Testing   [facility] CustomNext Panel was Negative. Report date is 03/13/2022.  The CustomNext-Cancer+RNAinsight panel offered by [facility] includes sequencing and rearrangement analysis for the following 47 genes:  APC, ATM, AXIN2, BARD1, BMPR1A, BRCA1, BRCA2, BRIP1, CDH1, CDK4, CDKN2A, CHEK2, CTNNA1, DICER1, EPCAM, GREM1, HOXB13, KIT, MEN1, MLH1, MSH2, MSH3, MSH6, MUTYH, NBN, NF1, NTHL1, PALB2,  PDGFRA, PMS2, POLD1, POLE, PTEN, RAD50, RAD51C, RAD51D, SDHA, SDHB, SDHC, SDHD, SMAD4, SMARCA4, STK11, TP53, TSC1, TSC2, and VHL.  RNA data is routinely analyzed for use in variant interpretation for all genes.     FAMILY HISTORY:  We obtained a detailed, 4-generation family history.  Significant diagnoses are listed below:      Family History  Problem Relation Age of Onset   Hypertension Mother     Clotting disorder Mother     Breast cancer Mother 96   Arthritis Mother     Stroke Mother     Diabetes Mother     Colon cancer Mother 52 - 37   Alcohol abuse Father     Breast cancer Half-Sister 12        recurrence at 16, reports positive genetic testing   Arthritis Half-Sister     Diabetes Half-Sister     Colon cancer Half-Sister 39   Multiple sclerosis Sister     Prostate cancer Half-Brother     Stomach cancer Half-Brother     Allergies Other          grandson   Allergic rhinitis Neg Hx     Angioedema Neg Hx     Asthma Neg Hx     Atopy Neg Hx     Eczema Neg Hx     Immunodeficiency Neg Hx     Urticaria Neg Hx         [name]'s maternal half-sister was diagnosed with breast cancer at age 8 and recurrent breast cancer at age 90, she reports positive genetic testing but does not know the gene. A second maternal half-sister was diagnosed with colon cancer at age 27, she  died due to metastatic cancer. Her paternal half-brother was diagnosed with prostate cancer and later died due to metastatic stomach cancer. Her mother was diagnosed with breast cancer at age 40 and colon cancer in her 13s, she died due to metastatic colon cancer at age 17. Her paternal aunt was diagnosed with an unknown cancer at an unknown age. There is no reported Ashkenazi Jewish ancestry.   GENETIC TEST RESULTS:  The [facility] CustomNext Panel found no pathogenic mutations.  The CustomNext-Cancer+RNAinsight panel offered by [facility] includes sequencing and rearrangement analysis for the following 47  genes:  APC, ATM, AXIN2, BARD1, BMPR1A, BRCA1, BRCA2, BRIP1, CDH1, CDK4, CDKN2A, CHEK2, CTNNA1, DICER1, EPCAM, GREM1, HOXB13, KIT, MEN1, MLH1, MSH2, MSH3, MSH6, MUTYH, NBN, NF1, NTHL1, PALB2, PDGFRA, PMS2, POLD1, POLE, PTEN, RAD50, RAD51C, RAD51D, SDHA, SDHB, SDHC, SDHD, SMAD4, SMARCA4, STK11, TP53, TSC1, TSC2, and VHL.  RNA data is routinely analyzed for use in variant interpretation for all genes.  The test report has been scanned into EPIC and is located under the Molecular Pathology section of the Results Review tab.  A portion of the result report is included below for reference. Genetic testing reported out on 03/13/2022.       Even though a pathogenic variant was not identified, possible explanations for the cancer in the family may include: There may be no hereditary risk for cancer in the family. The cancers in [name] and/or her family may be due to other genetic or environmental factors. There may be a gene mutation in one of these genes that current testing methods cannot detect, but that chance is small. There could be another gene that has not yet been discovered, or that we have not yet tested, that is responsible for the cancer diagnoses in the family.  It is also possible there is a hereditary cause for the cancer in the family that [name] did not inherit.  Therefore, it is important to remain in touch with cancer genetics in the future so that we can continue to offer [name] the most up to date genetic testing.   ADDITIONAL GENETIC TESTING:  We discussed with [name] that her genetic testing was fairly extensive.  If there are genes identified to increase cancer risk that can be analyzed in the future, we would be happy to discuss and coordinate this testing at that time.    CANCER SCREENING RECOMMENDATIONS:  [name]'s test result is considered negative (normal).  This means that we have not identified a hereditary cause for her personal and family history of  cancer at this time.   An individual's cancer risk and medical management are not determined by genetic test results alone. Overall cancer risk assessment incorporates additional factors, including personal medical history, family history, and any available genetic information that may result in a personalized plan for cancer prevention and surveillance. Therefore, it is recommended she continue to follow the cancer management and screening guidelines provided by her oncology and primary healthcare provider.  RECOMMENDATIONS FOR FAMILY MEMBERS:   Since she did not inherit a mutation in a cancer predisposition gene included on this panel, her daughter could not have inherited a mutation from her in one of these genes. Individuals in this family might be at some increased risk of developing cancer, over the general population risk, due to the family history of cancer. We recommend women in this family have a yearly mammogram beginning at age 64, or 72 years younger than the earliest onset of cancer,  an annual clinical breast exam, and perform monthly breast self-exams.  FOLLOW-UP:  Cancer genetics is a rapidly advancing field and it is possible that new genetic tests will be appropriate for her and/or her family members in the future. We encouraged her to remain in contact with cancer genetics on an annual basis so we can update her personal and family histories and let her know of advances in cancer genetics that may benefit this family.   Our contact number was provided. Ms. Khatoon's questions were answered to her satisfaction, and she knows she is welcome to call us at anytime with additional questions or concerns.   [name], MS, [facility] Genetic Counselor [email or web address] (P) [phone]

## 2022-03-21 NOTE — Telephone Encounter (Signed)
noted 

## 2022-03-21 NOTE — Pre-Procedure Instructions (Signed)
Surgical Instructions    Your procedure is scheduled on Thursday September 14.  Report to [facility] Main Entrance "A" at 5:30 A.M., then check in with the Admitting office.  Call this number if you have problems the morning of surgery:  [phone]   If you have any questions prior to your surgery date call [phone]: Open Monday-Friday 8am-4pm    Remember:  Do not eat after midnight the night before your surgery  You may drink clear liquids until 4:30 the morning of your surgery.   Clear liquids allowed are: Water, Non-Citrus Juices (without pulp), Carbonated Beverages, Clear Tea, Black Coffee ONLY (NO MILK, CREAM OR POWDERED CREAMER of any kind), and Gatorade    Take these medicines the morning of surgery with A SIP OF WATER:  atorvastatin (LIPITOR) fluticasone-salmeterol (ADVAIR HFA) gabapentin (NEURONTIN) methimazole (TAPAZOLE) nystatin (MYCOSTATIN)  pantoprazole (PROTONIX) Tiotropium Bromide Monohydrate (SPIRIVA RESPIMAT)  IF NEEDED: acetaminophen (TYLENOL) albuterol (VENTOLIN HFA) fluticasone (FLOVENT HFA) hydrOXYzine (ATARAX/VISTARIL) ipratropium (ATROVENT) levalbuterol (XOPENEX) ondansetron (ZOFRAN-ODT)  Please bring all inhalers with you the day of surgery.   WHAT DO I DO ABOUT MY DIABETES MEDICATION?  Do not take Empagliflozin-linaGLIPtin (GLYXAMBI)the morning of surgery.  HOW TO MANAGE YOUR DIABETES BEFORE AND AFTER SURGERY  Why is it important to control my blood sugar before and after surgery? Improving blood sugar levels before and after surgery helps healing and can limit problems. A way of improving blood sugar control is eating a healthy diet by:  Eating less sugar and carbohydrates  Increasing activity/exercise  Talking with your doctor about reaching your blood sugar goals High blood sugars (greater than 180 mg/dL) can raise your risk of infections and slow your recovery, so you will need to focus on controlling your diabetes during the weeks  before surgery. Make sure that the doctor who takes care of your diabetes knows about your planned surgery including the date and location.  How do I manage my blood sugar before surgery? Check your blood sugar at least 4 times a day, starting 2 days before surgery, to make sure that the level is not too high or low.  Check your blood sugar the morning of your surgery when you wake up and every 2 hours until you get to the Short Stay unit.  If your blood sugar is less than 70 mg/dL, you will need to treat for low blood sugar: Do not take insulin. Treat a low blood sugar (less than 70 mg/dL) with  cup of clear juice (cranberry or apple), 4 glucose tablets, OR glucose gel. Recheck blood sugar in 15 minutes after treatment (to make sure it is greater than 70 mg/dL). If your blood sugar is not greater than 70 mg/dL on recheck, call [phone] for further instructions. Report your blood sugar to the short stay nurse when you get to Short Stay.  If you are admitted to the hospital after surgery: Your blood sugar will be checked by the staff and you will probably be given insulin after surgery (instead of oral diabetes medicines) to make sure you have good blood sugar levels. The goal for blood sugar control after surgery is 80-180 mg/dL.  Per Dr. Brantley Stage- hold Xarelto for 48 hours prior to surgery. Last dose should be Monday September 11  As of today, STOP taking any Aspirin (unless otherwise instructed by your surgeon) Aleve, Naproxen, Ibuprofen, Motrin, Advil, Goody's, BC's, all herbal medications, fish oil, and all vitamins.           Do not  wear jewelry or makeup. Do not wear lotions, powders, perfumes or deodorant. Do not shave 48 hours prior to surgery. Do not bring valuables to the hospital. Do not wear nail polish, gel polish, artificial nails, or any other type of covering on natural nails (fingers and toes) If you have artificial nails or gel coating that need to be removed by a  nail salon, please have this removed prior to surgery. Artificial nails or gel coating may interfere with anesthesia's ability to adequately monitor your vital signs.  [place] is not responsible for any belongings or valuables.    Do NOT Smoke (Tobacco/Vaping)  24 hours prior to your procedure  If you use a CPAP at night, you may bring your mask for your overnight stay.   Contacts, glasses, hearing aids, dentures or partials may not be worn into surgery, please bring cases for these belongings   For patients admitted to the hospital, discharge time will be determined by your treatment team.   Patients discharged the day of surgery will not be allowed to drive home, and someone needs to stay with them for 24 hours.   SURGICAL WAITING ROOM VISITATION Patients having surgery or a procedure may have no more than 2 support people in the waiting area - these visitors may rotate.   Children under the age of 55 must have an adult with them who is not the patient. If the patient needs to stay at the hospital during part of their recovery, the visitor guidelines for inpatient rooms apply. Pre-op nurse will coordinate an appropriate time for 1 support person to accompany patient in pre-op.  This support person may not rotate.   Please refer to the [facility] website for the visitor guidelines for Inpatients (after your surgery is over and you are in a regular room).    Special instructions:    Oral Hygiene is also important to reduce your risk of infection.  Remember - BRUSH YOUR TEETH THE MORNING OF SURGERY WITH YOUR REGULAR TOOTHPASTE   [place]- Preparing For Surgery  Before surgery, you can play an important role. Because skin is not sterile, your skin needs to be as free of germs as possible. You can reduce the number of germs on your skin by washing with CHG (chlorahexidine gluconate) Soap before surgery.  CHG is an antiseptic cleaner which kills germs and bonds with the skin to  continue killing germs even after washing.     Please do not use if you have an allergy to CHG or antibacterial soaps. If your skin becomes reddened/irritated stop using the CHG.  Do not shave (including legs and underarms) for at least 48 hours prior to first CHG shower. It is OK to shave your face.  Please follow these instructions carefully.     Shower the NIGHT BEFORE SURGERY and the MORNING OF SURGERY with CHG Soap.   If you chose to wash your hair, wash your hair first as usual with your normal shampoo. After you shampoo, rinse your hair and body thoroughly to remove the shampoo.  Then [employer] and genitals (private parts) with your normal soap and rinse thoroughly to remove soap.  After that Use CHG Soap as you would any other liquid soap. You can apply CHG directly to the skin and wash gently with a scrungie or a clean washcloth.   Apply the CHG Soap to your body ONLY FROM THE NECK DOWN.  Do not use on open wounds or open sores. Avoid contact  with your eyes, ears, mouth and genitals (private parts). Wash Face and genitals (private parts)  with your normal soap.   Wash thoroughly, paying special attention to the area where your surgery will be performed.  Thoroughly rinse your body with warm water from the neck down.  DO NOT shower/wash with your normal soap after using and rinsing off the CHG Soap.  Pat yourself dry with a CLEAN TOWEL.  Wear CLEAN PAJAMAS to bed the night before surgery  Place CLEAN SHEETS on your bed the night before your surgery  DO NOT SLEEP WITH PETS.   Day of Surgery:  Take a shower with CHG soap. Wear Clean/Comfortable clothing the morning of surgery Do not apply any deodorants/lotions.   Remember to brush your teeth WITH YOUR REGULAR TOOTHPASTE.    If you received a COVID test during your pre-op visit, it is requested that you wear a mask when out in public, stay away from anyone that may not be feeling well, and notify your surgeon if you  develop symptoms. If you have been in contact with anyone that has tested positive in the last 10 days, please notify your surgeon.    Please read over the following fact sheets that you were given.

## 2022-03-21 NOTE — Telephone Encounter (Signed)
Prescription refill request for Xarelto received.  Indication: Aflutter Last office visit: Weight:88.9kg Age: 76 Scr: 1.03 (02/28/22)  CrCl: 66.62m/min  Appropriate dose and refill sent to requested pharmacy.

## 2022-03-22 ENCOUNTER — Telehealth: Payer: Self-pay

## 2022-03-22 ENCOUNTER — Other Ambulatory Visit: Payer: Self-pay

## 2022-03-22 ENCOUNTER — Encounter: Payer: Self-pay | Admitting: Genetic Counselor

## 2022-03-22 ENCOUNTER — Encounter (HOSPITAL_COMMUNITY)
Admission: RE | Admit: 2022-03-22 | Discharge: 2022-03-22 | Disposition: A | Discharge status: Home / Self Care | Payer: Medicare Other | Source: Ambulatory Visit | Attending: Surgery | Admitting: Surgery

## 2022-03-22 ENCOUNTER — Encounter (HOSPITAL_COMMUNITY): Payer: Self-pay

## 2022-03-22 VITALS — BP 138/62 | Temp 97.7°F | Resp 17 | Ht 64.0 in | Wt 198.4 lb

## 2022-03-22 DIAGNOSIS — E785 Hyperlipidemia, unspecified: Secondary | ICD-10-CM | POA: Insufficient documentation

## 2022-03-22 DIAGNOSIS — Z86718 Personal history of other venous thrombosis and embolism: Secondary | ICD-10-CM | POA: Insufficient documentation

## 2022-03-22 DIAGNOSIS — I10 Essential (primary) hypertension: Secondary | ICD-10-CM | POA: Diagnosis not present

## 2022-03-22 DIAGNOSIS — I272 Pulmonary hypertension, unspecified: Secondary | ICD-10-CM | POA: Insufficient documentation

## 2022-03-22 DIAGNOSIS — G4733 Obstructive sleep apnea (adult) (pediatric): Secondary | ICD-10-CM | POA: Diagnosis not present

## 2022-03-22 DIAGNOSIS — I5032 Chronic diastolic (congestive) heart failure: Secondary | ICD-10-CM | POA: Insufficient documentation

## 2022-03-22 DIAGNOSIS — Z7951 Long term (current) use of inhaled steroids: Secondary | ICD-10-CM | POA: Diagnosis not present

## 2022-03-22 DIAGNOSIS — Z01818 Encounter for other preprocedural examination: Secondary | ICD-10-CM | POA: Insufficient documentation

## 2022-03-22 DIAGNOSIS — I480 Paroxysmal atrial fibrillation: Secondary | ICD-10-CM | POA: Diagnosis not present

## 2022-03-22 DIAGNOSIS — E1140 Type 2 diabetes mellitus with diabetic neuropathy, unspecified: Secondary | ICD-10-CM | POA: Diagnosis not present

## 2022-03-22 DIAGNOSIS — J4550 Severe persistent asthma, uncomplicated: Secondary | ICD-10-CM | POA: Insufficient documentation

## 2022-03-22 DIAGNOSIS — I4892 Unspecified atrial flutter: Secondary | ICD-10-CM | POA: Insufficient documentation

## 2022-03-22 LAB — GLUCOSE, CAPILLARY: Glucose-Capillary: 118 mg/dL — ABNORMAL HIGH (ref 70–99)

## 2022-03-22 LAB — HEMOGLOBIN A1C
Hgb A1c MFr Bld: 5.6 % (ref 4.8–5.6)
Mean Plasma Glucose: 114.02 mg/dL

## 2022-03-22 NOTE — Progress Notes (Signed)
PCP - Dr. [name] Cardiologist - Dr. Dani Gobble Croitoru Pulmonologist: Dr. Allena Katz Oncology: Dr. Layla Maw  PPM/ICD - Denies  Chest x-ray - N/A  EKG - 12/18/21 Stress Test - 04/10/18 ECHO - 11/07/20 Cardiac Cath - Denies  Sleep Study - Yes CPAP - Yes  Fasting Blood Sugar - 90's Checks Blood Sugar __1___ time a day  Blood Thinner Instructions: hold 48 hrs prior to surgery per Dr. Brantley Stage Aspirin Instructions: N/A  ERAS Protcol - Yes PRE-SURGERY Ensure or G2- No  COVID TEST- N/A   Anesthesia review: Yes, seed placement; cardiac hx  Patient denies shortness of breath, fever, cough and chest pain at PAT appointment   All instructions explained to the patient, with a verbal understanding of the material. Patient agrees to go over the instructions while at home for a better understanding. Patient also instructed to self quarantine after being tested for COVID-19. The opportunity to ask questions was provided. '

## 2022-03-22 NOTE — Progress Notes (Signed)
Chronic Care Management Pharmacy Assistant   Name: DELPHINA SCHUM  MRN: [id number] DOB: [birth year]  Reason for Encounter: CCM (Initial Questions)  Recent office visits:  03/09/22 [name], MD Shoulder Pain Start: cyclobenzaprine (FLEXERIL) 10 MG tablet 12/29/21 [name], MD S/P Cholecystectomy Abnormal Labs: "Metabolic panel is in the normal range. Hemoglobin is slightly lower than at hospital discharge.  I believe she has an upcoming surgical follow-up. Make sure she has them review her hemoglobin level and they can consider rechecking at that time. White blood cell count is in normal range."  Start: ESTRACE 0.1 MG/GM vaginal cream Start: DIFLUCAN 150 MG 12/12/21 [name], MD Vaginal burning Stop (completed): VIBRAMYCIN 100 MG Start: Cortisone 10  11/03/21 [name], MD HTN Stop: ANTIVERT 12.5 MG Stop: DITROPAN-XL 10 MG Stop: DETROL LA 4 MG  08/15/21 [name], MD Concussion Advised: Cane or walker No med changes 08/01/21 [name], MD Chronic Pain Management No med changes  Recent consult visits:  03/16/22 Genetic testing results: No pathogenic variants were identified in the 47 genes analyzed. 03/14/22 Ward Givens, NP (Neurology) OSA on CPAP FU 1 year 03/06/22 Salvatore Marvel, MD (Allergy) Administered: omalizumab Arvid Right) injection 300 mg 02/28/22 [name], MD (Oncology) Ductal carcinoma in situ (DCIS) of left breast Stop: fluticasone-salmeterol (ADVAIR HFA) 230-21 MCG/ACT inhaler Stop: ondansetron (ZOFRAN) 4 MG tablet Stop: Tiotropium Bromide Monohydrate (SPIRIVA RESPIMAT) 1.25 MCG/ACT AERS 02/05/22 Hedy Camara, CMA (Allergy) Asthma Administered: omalizumab Arvid Right) injection 300 mg 02/02/22 Acquanetta Sit, DPM (Podiatry) Pain Onychomycosis of toenail Procedure: Toenails 1-5 b/l were debrided 01/29/22 Conni Slipper, RN (Gen Surgery) Telephone Constipation Pt instructed to try Miralax OTC once a day instead of the Trulance and see if this promotes one soft BM  a day. Pt instructed to discuss issues with her GI doctor if this issue does not improve.  01/11/22 Rolm Bookbinder, MD (Gen Surgery) Post Cholecystectomy No med changes 01/09/22 Salem Senate, LPN (Allergy) Administered: Arvid Right injection 300 mg 12/28/21 Diona Browner, NP (Cardiology) Atypical Atrial Flutter No med changes FU 3 - 4 months 12/26/21 Allena Katz, MD (Allergy) No med changes FU 6 months 12/25/21 Conni Slipper, RN (Gen Surgery) Telephone Diarrhea Pt instructed to start Metamucil  12/15/21 Viviana Simpler, MD Right ankle pain No med changes  12/14/21 Rhett Hallows, MD (Ortho) 9 weeks Post-op right knee joint FU 9 months 12/12/21 Allena Katz, MD (Allergy) Ordered: Spirometry Start: OTC Nasacort Start: Flovent 220 Stop: Acetaminophen 1,300 mg 12/06/21 Max Hyatt, DPM (Podiatry) Bursitis of left foot  11/14/21 Salem Senate, LPN (Allergy) Administered: Arvid Right injection 300 mg 11/08/21 Podiatry telephone Start: Diflucan 150 mg tablet due to yeast infection 11/01/21 Max Hyatt, DPM (Podiatry) Bursitis of left foot  10/31/21 Philemon Kingdom, MD (Internal Medicine) Graves Disease No med changes. FU 6 months 10/31/21 Acquanetta Sit, DPM (Podiatry) Pain Onychomycosis of toenail Ordered: DG Foot Left 10/30/21 Royann Shivers, PA (Ortho) Post-op right knee joint FU 3 - 5 weeks 10/621 Jennette Banker, MD (Nutrition) Telemedicine Unplanned weight loss  10/17/21 Salem Senate, LPN (Allergy) Administered: Arvid Right injection 300 mg 10/04/21 Max Hyatt, DPM (Podiatry) Diabetic Polyneuropathy FU 3 months 09/28/21 Jennette Banker, MD (Nutrition) Telemedicine Unplanned weight loss  09/25/21 Royann Shivers, PA (Orthopaedics) Right knee pain Pre-Op Knee Replacement 09/12/21 Clovis Cao, CMA (Allergy) Administered: Arvid Right injection 300 mg 09/06/21 Melida Quitter, MD (ENT) Hemoptysis Procedure: Fiberoptic exam of the Pharynx and Larynx (unremarkable) 08/28/21 Lavell Anchors (Neurosurgery) Intervertebral disc degeneration  No med changes 08/15/21 Tally Joe, CMA (Allergy) Administered: Arvid Right injection 300  mg 08/10/21 [facility] Fall - Referred to ED 08/08/21 Tyson Dense, DPM (Podiatry) Contracture of toe joint 08/01/21 Max Milinda Pointer, DPM (Podiatry) Contracture of toe joint   Hospital visits:  Medication Reconciliation was completed by comparing discharge summary, patient's EMR and Pharmacy list, and upon discussion with patient.   Admitted to the hospital on 12/19/21 due to pain, nausea and vomiting. Discharge date was 12/23/21. Discharged from [facility].     Final Diagnoses: Cholecystitis   New?Medications Started at [facility] Discharge:?? -started ondansetron (ZOFRAN) 4 MG tablet -started traMADol (ULTRAM) 50 MG tablet   Medication Changes at Hospital Discharge: No changes   Medications Discontinued at Hospital Discharge: -Stopped acetaminophen-codeine 300-30 MG tablet (TYLENOL #3) -Stopped cyclobenzaprine 5 MG tablet (FLEXERIL) -Stopped diclofenac Sodium 1 % Gel (Voltaren) -Stopped oxyCODONE-acetaminophen 5-325 MG tablet (PERCOCET/ROXICET)   Medications that remain the same after Hospital Discharge:??  -All other medications will remain the same.     10/16/21 [facility] - Discharged same day Cellulitis - knee pain and swelling -Start Doxycycline Hyclate 100 mg -Start Ondansetron 8 mg -Start Oxycodone-Acetaminophen 5-325 mg   10/13/21 [name] - Discharged same day Visit for wound check per physical therapy - Final decision appears normal No med changes   10/10/21 [place] same day Right total Knee Arthroplasty -Start Acetaminophen 325 mg -Start Hydromorphone 2 mg  -Start Sennosides-docusate 8.6-50 mg -Stop Acetaminophen 650 mg -Stop Tapentadol 50 mg -Stop Tramadol 50 mg   08/12/21 [facility] - Discharged same day Headache and Emesis Procedures: Flu and Covid and CT Head Final Diagnoses: Post concussive syndrome  and Adverse effect of drug   08/10/21 [facility] - Discharged same day Fall Procedures: CT Head, DG Lumbar Spine, DG Thoracic Spine, DG Pelvis Final Diagnoses: Fall, Acute nonintractable headache, Injury of head, Acute midline back pain Change - FLEXERIL 5 MG tablet    Medications: Outpatient Encounter Medications as of 03/22/2022  Medication Sig Note   acetaminophen (TYLENOL) 500 MG tablet Take 500-1,000 mg by mouth every 6 (six) hours as needed for moderate pain.    albuterol (VENTOLIN HFA) 108 (90 Base) MCG/ACT inhaler INHALE 2 PUFFS BY MOUTH EVERY 6 HOURS AS NEEDED FOR WHEEZING OR SHORTNESS OF BREATH    Artificial Saliva (BIOTENE DRY MOUTH) LOZG Use as directed 1 lozenge in the mouth or throat daily as needed (dry mouth).    atorvastatin (LIPITOR) 10 MG tablet TAKE 1 TABLET(10 MG) BY MOUTH DAILY    Blood Glucose Monitoring Suppl (ONETOUCH VERIO) w/Device KIT Use to check blood sugar up to 2 times a day    Coenzyme Q10 (COQ10) 100 MG CAPS Take 100 mg by mouth daily.    cyclobenzaprine (FLEXERIL) 10 MG tablet Take 0.5-1 tablets (5-10 mg total) by mouth at bedtime as needed for muscle spasms.    Empagliflozin-linaGLIPtin (GLYXAMBI) 10-5 MG TABS Take 1 tablet by mouth daily.    EPINEPHrine 0.3 mg/0.3 mL IJ SOAJ injection USE AS DIRECTED FOR LIFE THREATENING ALLERGIC REACTIONS    estradiol (ESTRACE) 0.1 MG/GM vaginal cream 0.5 g of cream intravaginally administered daily for 2 weeks, then reduce to twice weekly (Patient taking differently: Place 1 Applicatorful vaginally daily as needed (irritation).)    famotidine (PEPCID) 40 MG tablet Take 1 (ONE) tablet by mouth every evening.    fluticasone (FLOVENT HFA) 220 MCG/ACT inhaler Inhale 2 puffs into the lungs 2 (two) times daily as needed (shortness of breath).    fluticasone-salmeterol (  ADVAIR HFA) 230-21 MCG/ACT inhaler Inhale 2 puffs into the lungs 2 (two) times daily.    furosemide (LASIX) 20 MG tablet Take 20 mg as needed for a weight  gain of 3 pounds overnight or 5 pounds in one week,. (Patient not taking: Reported on 03/14/2022)    gabapentin (NEURONTIN) 600 MG tablet TAKE 1 TABLET BY MOUTH EVERY DAY AT BREAKFAST, 1 TABLET AT LUNCH AND 2 TABLETS AT BEDTIME    glucose blood (ONETOUCH VERIO) test strip 1 strip 2 (two) times daily    hydrOXYzine (ATARAX/VISTARIL) 10 MG tablet Take 1 tablet (10 mg total) by mouth daily as needed for itching.    ipratropium (ATROVENT) 0.06 % nasal spray 1-2 sprays in each nostril 1-2 times per day as needed to dry nose    irbesartan (AVAPRO) 150 MG tablet Take 1 tablet (150 mg total) by mouth daily.    Lactase (DAIRY-RELIEF PO) Take 1 capsule by mouth daily as needed (eating dairy).     Lancets (ONETOUCH DELICA PLUS [id number]) MISC USE TO CHECK BLOOD SUGAR UP TO TWICE DAILY    levalbuterol (XOPENEX) 1.25 MG/3ML nebulizer solution USE 1 VIAL VIA NEBULIZER EVERY 6 HOURS AS NEEDED FOR SHORTNESS OF BREATH OR WHEEZING    liver oil-zinc oxide (DESITIN) 40 % ointment Apply 1 application topically as needed for irritation.    MAGNESIUM GLYCINATE PO Take 2 tablets by mouth 2 (two) times daily.    Menthol, Topical Analgesic, (ABSORBINE PLUS JR EX) Apply 1 patch topically daily as needed (pain).    Menthol-Methyl Salicylate (SALONPAS JET SPRAY EX) Apply 1 spray topically daily as needed (knee pain).    METAMUCIL FIBER PO Take 1 Dose by mouth daily. 1 dose = 1/2 tablespoon    methimazole (TAPAZOLE) 5 MG tablet Take 1 tablet (5 mg total) by mouth daily.    montelukast (SINGULAIR) 10 MG tablet Take 1 tablet (10 mg total) by mouth at bedtime. For asthma control.    Mouthwashes (BIOTENE DRY MOUTH MT) Use as directed 1 Dose in the mouth or throat daily as needed (dry mouth).    nystatin (MYCOSTATIN) 100000 UNIT/ML suspension 31m swish and swallow after Advair 2 times daily.    ondansetron (ZOFRAN-ODT) 8 MG disintegrating tablet Take 1 tablet (8 mg total) by mouth every 8 (eight) hours as needed for nausea or  vomiting.    ONETOUCH VERIO test strip USE TO CHECK BLOOD SUGAR UP TO TWICE DAILY AS DIRECTED    oxybutynin (DITROPAN-XL) 10 MG 24 hr tablet Take 10 mg by mouth at bedtime. 03/20/2022: Takes oxybutynin and tolterodine both prescribed by Dr. MMatilde Sprang    pantoprazole (PROTONIX) 40 MG tablet Take 1 (ONE) tablet by mouth every morning.    Plecanatide (TRULANCE) 3 MG TABS Take 3 mg by mouth daily as needed (constipation).    Polyethyl Glycol-Propyl Glycol (SYSTANE OP) Place 1 drop into both eyes daily as needed (dry eyes).    potassium chloride SA (KLOR-CON) 20 MEQ tablet Take 1 tablet (20 mEq total) by mouth as needed (Take one daily when you need to take a Furosemide). (Patient not taking: Reported on 03/20/2022)    PRESCRIPTION MEDICATION Inhale into the lungs at bedtime. CPAP    Tiotropium Bromide Monohydrate (SPIRIVA RESPIMAT) 1.25 MCG/ACT AERS Inhale 2 each into the lungs daily.    tolterodine (DETROL LA) 4 MG 24 hr capsule Take 4 mg by mouth at bedtime. 03/20/2022: Takes oxybutynin and tolterodine both prescribed by Dr. MMatilde Sprang    traMADol (ULTRAM) 50  MG tablet Take 1-2 tablets (50-100 mg total) by mouth every 6 (six) hours as needed for moderate pain or severe pain (50 mg for moderate, 100 mg for severe). (Patient not taking: Reported on 03/20/2022)    XARELTO 20 MG TABS tablet TAKE 1 TABLET(20 MG) BY MOUTH DAILY WITH SUPPER    Facility-Administered Encounter Medications as of 03/22/2022  Medication   omalizumab Arvid Right) injection 300 mg    Lab Results  Component Value Date/Time   HGBA1C 5.3 11/03/2021 02:29 PM   HGBA1C 5.7 04/27/2021 12:39 PM   HGBA1C 6.2 04/20/2020 11:31 AM   HGBA1C 6.2 08/14/2016 12:00 AM   HGBA1C 6.1 01/27/2016 12:00 AM     BP Readings from Last 3 Encounters:  03/14/22 (!) 125/59  03/09/22 (!) 160/80  03/06/22 130/82     Patient contacted to confirm telephone appointment with Charlene Brooke, Pharm D, on 03/27/2022 at 9:30.  Do you have any problems getting  your medications? No  What is your top health concern you would like to discuss at your upcoming visit? None at this time.  Have you seen any other providers since your last visit with PCP? Yes  CCM referral has been placed prior to visit?  Yes   Star Rating Drugs:  Medication:  Last Fill: Day Supply Atorvastatin 10 mg 03/01/2022 90 Irbesartan 150 mg 01/01/2022 90  Care Gaps: Annual wellness visit in last year? No Most Recent BP reading: 125/59 on 03/14/2022  If Diabetic: Most recent A1C reading: 5.3 on 11/03/2021 Last eye exam / retinopathy screening: Up to date Last diabetic foot exam: Up to date  Charlene Brooke, CPP notified  Marijean Niemann, [place] Assistant [phone]

## 2022-03-23 ENCOUNTER — Telehealth: Payer: Self-pay | Admitting: *Deleted

## 2022-03-23 NOTE — Progress Notes (Addendum)
Anesthesia Chart Review:  Follows with allergist for severe persistent asthma, maintained on Advair, Spiriva, Singulair, and Xolair. Asthma noted to be well controlled at last followup 03/06/22.  Follows with cardiology for hx of atrial flutter s/p DCCV in 11/2020, paroxysmal atrial fibrillation, chronic diastolic heart failure, remote DVT/PE, moderate pulmonary hypertension, hypertension, hyperlipidemia. Previous coronary angiography showed no evidence of CAD though she did have moderate pulmonary artery hypertension RHC (systolic PAP 50 mmHg). Lexiscan Myoview in 2019 was low risk, no evidence of ischemia.  Remote history of atrial fibrillation.  Echocardiogram in April 2022 showed EF 60 to 65%, moderate asymmetric LVH of the basal-septal segment, indeterminate diastolic parameters mildly elevated PASP.  She was noted to be in atrial flutter at the time and underwent DCCV in May 2022. Last seen 12/28/21, stable at that time, no changes to management.   Pt instructed by Dr. Brantley Stage to hold Xarelto 48 hrs prior to surgery.  Follows with oncology for recent diagnosis of DCIS of Left breast. Has not undergone surgery or chemotherapy yet.  Remote history of possible seizures in the 1990s (notes describe "jerking"). Was on Keppra at that time but subsequently weaned off with no further episodes per notes.  OSA on CPAP.  Non insulin dependent DMII, well controlled, A1c 5.9 on 03/22/22.  CMP and CBC from 02/28/22 reviewed, unremarkable.   EKG 12/18/21: Sinus tachycardia. Rate 108. LAD, consider left anterior fascicular block. Probable anteroseptal infarct, old  TTE 11/07/20:  1. Rhythm appears to be atrial flutter with variable conduction. Left  ventricular ejection fraction, by estimation, is 60 to 65%. The left  ventricle has normal function. The left ventricle has no regional wall  motion abnormalities. There is moderate  asymmetric left ventricular hypertrophy of the basal-septal segment. Left   ventricular diastolic parameters are indeterminate.   2. Right ventricular systolic function is normal. The right ventricular  size is normal. There is mildly elevated pulmonary artery systolic  pressure. The estimated right ventricular systolic pressure is 29.5 mmHg.   3. Left atrial size was mildly dilated.   4. Right atrial size was moderately dilated.   5. The mitral valve is normal in structure. Trivial mitral valve  regurgitation. No evidence of mitral stenosis.   6. Tricuspid valve regurgitation is moderate.   7. The aortic valve is tricuspid. Aortic valve regurgitation is not  visualized. No aortic stenosis is present.   8. The inferior vena cava is normal in size with greater than 50%  respiratory variability, suggesting right atrial pressure of 3 mmHg.   Nuclear stress 04/09/18: The left ventricular ejection fraction is normal (55-65%). Nuclear stress EF: 63%. There was no ST segment deviation noted during stress. The study is normal. This is a low risk study.   Normal resting and stress perfusion. No ischemia or infarction EF 63%   Wynonia Musty [facility] Short Stay Center/Anesthesiology Phone [phone] 03/23/2022 10:28 AM

## 2022-03-23 NOTE — Anesthesia Preprocedure Evaluation (Addendum)
Anesthesia Evaluation  Patient identified by MRN, date of birth, ID band Patient awake    Reviewed: Allergy & Precautions, NPO status , Patient's Chart, lab work & pertinent test results  History of Anesthesia Complications Negative for: history of anesthetic complications  Airway Mallampati: II  TM Distance: >3 FB Neck ROM: Full    Dental  (+) Dental Advisory Given, Teeth Intact   Pulmonary asthma , sleep apnea and Continuous Positive Airway Pressure Ventilation , PE   Pulmonary exam normal        Cardiovascular hypertension, pulmonary hypertension+CHF (diastolic) and + DVT  Normal cardiovascular exam+ dysrhythmias Atrial Fibrillation   Previous coronary angiography showed no evidence of CAD though she did have moderate pulmonary artery hypertension RHC (systolic PAP 50 mmHg). Lexiscan Myoview in 2019 was low risk, no evidence of ischemia.  Remote history of atrial fibrillation.  Echocardiogram in April 2022 showed EF 60 to 65%, moderate asymmetric LVH of the basal-septal segment, indeterminate diastolic parameters mildly elevated PASP (44)   Neuro/Psych negative neurological ROS     GI/Hepatic Neg liver ROS, GERD  ,  Endo/Other  diabetes, Type 2Hyperthyroidism (on methimazole)   Renal/GU negative Renal ROSCr 1.03  negative genitourinary   Musculoskeletal negative musculoskeletal ROS (+)   Abdominal   Peds  Hematology negative hematology ROS (+)   Anesthesia Other Findings   Reproductive/Obstetrics                          Anesthesia Physical Anesthesia Plan  ASA: 3  Anesthesia Plan: General   Post-op Pain Management: Tylenol PO (pre-op)*   Induction: Intravenous  PONV Risk Score and Plan: 3 and Ondansetron, Dexamethasone, Midazolam and Treatment may vary due to age or medical condition  Airway Management Planned: LMA  Additional Equipment: None  Intra-op Plan:   Post-operative  Plan: Extubation in OR  Informed Consent: I have reviewed the patients History and Physical, chart, labs and discussed the procedure including the risks, benefits and alternatives for the proposed anesthesia with the patient or authorized representative who has indicated his/her understanding and acceptance.     Dental advisory given  Plan Discussed with:   Anesthesia Plan Comments: (PAT note by Karoline Caldwell, PA-C: Follows with allergist for severe persistent asthma, maintained on Advair, Spiriva, Singulair, and Xolair. Asthma noted to be well controlled at last followup 03/06/22.  Follows with cardiology for hx of atrialflutter s/p DCCV in5/2022,paroxysmalatrial fibrillation, chronicdiastolic heart failure,remote DVT/PE, moderate pulmonary hypertension,hypertension,hyperlipidemia. Previous coronary angiography showed no evidence of CAD though she did have moderate pulmonary artery hypertension RHC (systolic PAP 50 mmHg).LexiscanMyoview in 2019 was low risk, no evidence of ischemia. Remote history of atrial fibrillation. Echocardiogram in April 2022 showed EF 60 to 65%, moderate asymmetric LVH of the basal-septal segment, indeterminate diastolic parameters mildly elevated PASP. She was noted to be in atrial flutter atthetime and underwent DCCV in May 2022. Last seen 12/28/21, stable at that time, no changes to management.   Pt instructed by Dr. Brantley Stage to hold Xarelto 48 hrs prior to surgery.  Follows with oncology for recent diagnosis of DCIS of Left breast. Has not undergone surgery or chemotherapy yet.  Remote history of possible seizures in the 1990s (notes describe "jerking"). Was on Keppra at that time but subsequently weaned off with no further episodes per notes.  OSA on CPAP.  Non insulin dependent DMII, well controlled, A1c 5.9 on 03/22/22.  CMP and CBC from 02/28/22 reviewed, unremarkable.   EKG 12/18/21:  Sinus tachycardia. Rate 108. LAD, consider left anterior fascicular  block. Probable anteroseptal infarct, old  TTE 11/07/20: 1. Rhythm appears to be atrial flutter with variable conduction. Left  ventricular ejection fraction, by estimation, is 60 to 65%. The left  ventricle has normal function. The left ventricle has no regional wall  motion abnormalities. There is moderate  asymmetric left ventricular hypertrophy of the basal-septal segment. Left  ventricular diastolic parameters are indeterminate.  2. Right ventricular systolic function is normal. The right ventricular  size is normal. There is mildly elevated pulmonary artery systolic  pressure. The estimated right ventricular systolic pressure is 94.4 mmHg.  3. Left atrial size was mildly dilated.  4. Right atrial size was moderately dilated.  5. The mitral valve is normal in structure. Trivial mitral valve  regurgitation. No evidence of mitral stenosis.  6. Tricuspid valve regurgitation is moderate.  7. The aortic valve is tricuspid. Aortic valve regurgitation is not  visualized. No aortic stenosis is present.  8. The inferior vena cava is normal in size with greater than 50%  respiratory variability, suggesting right atrial pressure of 3 mmHg.   Nuclear stress 04/09/18: . The left ventricular ejection fraction is normal (55-65%). . Nuclear stress EF: 63%. . There was no ST segment deviation noted during stress. . The study is normal. . This is a low risk study.  Normal resting and stress perfusion. No ischemia or infarction EF 63% )     Anesthesia Quick Evaluation

## 2022-03-23 NOTE — Telephone Encounter (Signed)
Pt called and left vm requesting return call. No answer. Left vm requesting return call. Contact information provided.

## 2022-03-26 ENCOUNTER — Telehealth: Payer: Self-pay | Admitting: Family Medicine

## 2022-03-26 DIAGNOSIS — S46812A Strain of other muscles, fascia and tendons at shoulder and upper arm level, left arm, initial encounter: Secondary | ICD-10-CM | POA: Insufficient documentation

## 2022-03-26 NOTE — Telephone Encounter (Signed)
Spoke with [name] and advised that the goal for her blood sugar is FBS < 120 mg/dl and 2 HR PP <180 mg/dl.  Patient states understanding.

## 2022-03-26 NOTE — Assessment & Plan Note (Signed)
Acute, no indication for imaging.  No falls. Most consistent with trapezius muscle strain.  We will treat with heat gentle stretching, and muscle relaxant. Follow-up if not improving in 2 weeks

## 2022-03-26 NOTE — Telephone Encounter (Signed)
Patient called in stating she is having surgery in a few days. She stated she has some questions regarding taking her blood test (finger prick) and what levels she should be in. She can be reached at [phone]. Thank you!

## 2022-03-27 ENCOUNTER — Ambulatory Visit (INDEPENDENT_AMBULATORY_CARE_PROVIDER_SITE_OTHER): Payer: Medicare Other | Admitting: Pharmacist

## 2022-03-27 DIAGNOSIS — I152 Hypertension secondary to endocrine disorders: Secondary | ICD-10-CM

## 2022-03-27 DIAGNOSIS — J4540 Moderate persistent asthma, uncomplicated: Secondary | ICD-10-CM

## 2022-03-27 DIAGNOSIS — E1169 Type 2 diabetes mellitus with other specified complication: Secondary | ICD-10-CM

## 2022-03-27 DIAGNOSIS — E1140 Type 2 diabetes mellitus with diabetic neuropathy, unspecified: Secondary | ICD-10-CM

## 2022-03-27 DIAGNOSIS — I480 Paroxysmal atrial fibrillation: Secondary | ICD-10-CM

## 2022-03-27 NOTE — Progress Notes (Signed)
Chronic Care Management Pharmacy Note  04/04/2022 Name:  [name] MRN:  [id number] DOB:  23-Mar-1946  Summary: CCM F/U visit -Reviewed medications; pt affirms compliance as prescribed -New Dx breast cancer 02/2022, with resection planned shortly. Following with oncology. -HF stable; she has not needed furosemide lately and therefore not taking potassium either -Chronic constipation stable, reports 1 BM daily right now, mainly using Miralax. Only uses Trulance PRN -DM: A1c 5.6% (03/2022) in normal range; on Glyxambi (Jardiance + Tradjenta), she may not need combo therapy much longer if A1c remains <6%  Recommendations -No med changes; consider dropping Tradjenta component of Glyxambi, would continue Jardiance for HF benefit  Plan: -[place] will call patient 3 months for general update -Pharmacist follow up televisit scheduled for 6 months -PCP AWV 05/01/22    Subjective: [name] is an 76 y.o. year old female who is a primary patient of Bedsole, Amy E, MD.  The CCM team was consulted for assistance with disease management and care coordination needs.    Engaged with patient by telephone for follow up visit in response to provider referral for pharmacy case management and/or care coordination services.   Consent to Services:  The patient was given information about Chronic Care Management services, agreed to services, and gave verbal consent prior to initiation of services.  Please see initial visit note for detailed documentation.   Patient Care Team: Alicia Sanders, MD as PCP - General (Family Medicine) Croitoru, Dani Gobble, MD as PCP - Cardiology (Cardiology) Alicia Pilar, MD as Consulting Physician (Ophthalmology) [name], Alicia Poag, MD as Consulting Physician (Allergy and Immunology) Alicia Kraft, MD as Consulting Physician (Endocrinology) [name], DPM as Consulting Physician (Podiatry) Croitoru, Dani Gobble, MD as Consulting Physician  (Cardiology) Alicia Brownie, MD as Consulting Physician (Dermatology) Alicia Kerbs, MD as Referring Physician (Specialist) [name], [place] as Referring Physician (Chiropractic Medicine) Alicia Lobo, MD as Consulting Physician (Gastroenterology) Hallows, Verlee Monte, MD (Orthopedic Surgery) Dohmeier, Asencion Partridge, MD as Consulting Physician (Neurology) Alicia Germany, RN as Oncology Nurse Navigator Alicia Kaufmann, RN as Oncology Nurse Navigator Alicia Luna, MD as Consulting Physician (General Surgery) Alicia Pike, MD as Consulting Physician (Hematology and Oncology) Alicia Pray, MD as Consulting Physician ([place]) [name], [facility] [name] as Pharmacist (Pharmacist)  Recent office visits: 03/09/22 Alicia Lofts, MD: Shoulder Pain Start: cyclobenzaprine 10 MG tablet 12/29/21 Alicia Lofts, MD S/P Cholecystectomy Abnormal Labs: "Metabolic panel is in the normal range. Hemoglobin is slightly lower than at hospital discharge.  I believe she has an upcoming surgical follow-up. Make sure she has them review her hemoglobin level and they can consider rechecking at that time. White blood cell count is in normal range."  Start: ESTRACE 0.1 MG/GM vaginal cream Start: DIFLUCAN 150 MG 12/12/21 Alicia Lofts, MD Vaginal burning Stop (completed): VIBRAMYCIN 100 MG Start: Cortisone 10  11/03/21 Alicia Lofts, MD HTN Stop: ANTIVERT 12.5 MG Stop: DITROPAN-XL 10 MG Stop: DETROL LA 4 MG  08/15/21 Alicia Lofts, MD Concussion Advised: Cane or walker No med changes 08/01/21 Alicia Lofts, MD Chronic Pain Management No med changes  Recent consult visits: 03/16/22 Genetic testing results: No pathogenic variants were identified in the 47 genes analyzed. 03/14/22 Alicia Givens, NP (Neurology) OSA on CPAP FU 1 year 03/06/22 Alicia Marvel, MD (Allergy) Administered: omalizumab Arvid Right) injection 300 mg 02/28/22 Alicia Pike, MD (Oncology) Ductal carcinoma in situ (DCIS) of left breast (Dx 02/23/22).  Rec surgery, adjuvant radiation, then tamoxifen x 5 years. Stop: Advair, zofran,  02/05/22 [name], CMA (Allergy) Asthma Administered: omalizumab Arvid Right) injection 300 mg 02/02/22 [name], DPM (Podiatry) Pain Onychomycosis of toenail Procedure: Toenails 1-5 b/l were debrided 01/29/22 Alicia Slipper, RN (Gen Surgery) Telephone Constipation Pt instructed to try Miralax OTC once a day instead of the Trulance and see if this promotes one soft BM a day. Pt instructed to discuss issues with her GI doctor if this issue does not improve.  01/11/22 Alicia Bookbinder, MD (Gen Surgery) Post Cholecystectomy No med changes 01/09/22 Alicia Senate, LPN (Allergy) Administered: Arvid Right injection 300 mg 12/28/21 Diona Browner, NP (Cardiology) Atypical Atrial Flutter No med changes FU 3 - 4 months 12/26/21 Allena Katz, MD (Allergy) No med changes FU 6 months 12/25/21 Alicia Slipper, RN (Gen Surgery) Telephone Diarrhea Pt instructed to start Metamucil  12/15/21 Viviana Simpler, MD Right ankle pain No med changes  12/14/21 Rhett Hallows, MD (Ortho) 9 weeks Post-op right knee joint FU 9 months 12/12/21 Allena Katz, MD (Allergy) Ordered: Spirometry Start: OTC Nasacort Start: Flovent 220 Stop: Acetaminophen 1,300 mg 12/06/21 [name], DPM (Podiatry) Bursitis of left foot  11/14/21 Alicia Senate, LPN (Allergy) Administered: Arvid Right injection 300 mg 11/08/21 Podiatry telephone Start: Diflucan 150 mg tablet due to yeast infection 11/01/21 [name], DPM (Podiatry) Bursitis of left foot  10/31/21 Alicia Kingdom, MD (Internal Medicine) Graves Disease No med changes. FU 6 months 10/31/21 [name], DPM (Podiatry) Pain Onychomycosis of toenail 10/30/21 Alicia Shivers, PA (Ortho) Post-op right knee joint FU 3 - 5 weeks 10/621 Jennette Banker, MD (Nutrition) Telemedicine Unplanned weight loss  10/17/21 Alicia Senate, LPN (Allergy) Administered: Arvid Right injection 300 mg 10/04/21 [name], DPM (Podiatry) Diabetic  Polyneuropathy FU 3 months 09/28/21 Jennette Banker, MD (Nutrition) Telemedicine Unplanned weight loss  09/25/21 Alicia Shivers, PA (Orthopaedics) Right knee pain Pre-Op Knee Replacement 09/12/21 Clovis Cao, CMA (Allergy) Administered: Arvid Right injection 300 mg 09/06/21 Melida Quitter, MD (ENT) Hemoptysis Procedure: Fiberoptic exam of the Pharynx and Larynx (unremarkable) 08/28/21 Lavell Anchors (Neurosurgery) Intervertebral disc degeneration No med changes 08/15/21 Tally Joe, CMA (Allergy) Administered: Arvid Right injection 300 mg   Hospital visits: 12/18/21 - 12/23/21 Admission: sepsis d/t acute cholecystitis - s/p cholecystectomy   Objective:  Lab Results  Component Value Date   CREATININE 1.03 (H) 02/28/2022   BUN 16 02/28/2022   GFR 68.96 12/29/2021   EGFR 53 (L) 11/21/2020   GFRNONAA 57 (L) 02/28/2022   GFRAA 62 01/04/2020   NA 138 02/28/2022   K 4.4 02/28/2022   CALCIUM 9.1 02/28/2022   CO2 29 02/28/2022   GLUCOSE 99 02/28/2022    Lab Results  Component Value Date/Time   HGBA1C 5.6 03/22/2022 12:00 PM   HGBA1C 5.3 11/03/2021 02:29 PM   HGBA1C 5.7 04/27/2021 12:39 PM   HGBA1C 6.2 08/14/2016 12:00 AM   HGBA1C 6.1 01/27/2016 12:00 AM   GFR 68.96 12/29/2021 11:03 AM   GFR 60.45 04/27/2021 12:39 PM    Last diabetic Eye exam:  Lab Results  Component Value Date/Time   HMDIABEYEEXA No Retinopathy 04/11/2021 12:00 AM    Last diabetic Foot exam:  Lab Results  Component Value Date/Time   HMDIABFOOTEX done 04/17/2021 12:00 AM     Lab Results  Component Value Date   CHOL 120 04/27/2021   HDL 61.00 04/27/2021   LDLCALC 49 04/27/2021   TRIG 49.0 04/27/2021   CHOLHDL 2 04/27/2021       Latest Ref Rng & Units 02/28/2022    8:24 AM 12/20/2021    4:42 AM 12/19/2021  5:26 AM  Hepatic Function  Total Protein 6.5 - 8.1 g/dL 7.0  6.4  6.4   Albumin 3.5 - 5.0 g/dL 4.0  3.1  3.5   AST 15 - 41 U/L 16  33  25   ALT 0 - 44 U/L '10  12  13   ' Alk Phosphatase 38 - 126 U/L 80   90  80   Total Bilirubin 0.3 - 1.2 mg/dL 0.8  2.4  1.6     Lab Results  Component Value Date/Time   TSH 0.64 10/31/2021 11:15 AM   TSH 0.71 06/27/2021 10:24 AM   FREET4 1.39 10/31/2021 11:15 AM   FREET4 0.99 06/27/2021 10:24 AM       Latest Ref Rng & Units 02/28/2022    8:24 AM 12/29/2021   11:03 AM 12/23/2021    5:44 AM  CBC  WBC 4.0 - 10.5 K/uL 6.5  6.9  7.3   Hemoglobin 12.0 - 15.0 g/dL 12.2  10.9  11.6   Hematocrit 36.0 - 46.0 % 36.7  33.0  34.8   Platelets 150 - 400 K/uL 253  425.0  284     Lab Results  Component Value Date/Time   VD25OH 65.41 01/12/2021 12:15 PM   VD25OH 23 (L) 08/29/2018 04:15 PM   VD25OH 31.13 03/28/2018 11:49 AM    Clinical ASCVD: No  The ASCVD Risk score (Arnett DK, et al., 2019) failed to calculate for the following reasons:   The valid total cholesterol range is 130 to 320 mg/dL       11/03/2021    2:15 PM 04/27/2021   11:39 AM 06/20/2020   11:01 AM  Depression screen PHQ 2/9  Decreased Interest 0 0 0  Down, Depressed, Hopeless 0 0 0  PHQ - 2 Score 0 0 0  Altered sleeping 0    Tired, decreased energy 0    Change in appetite 0    Feeling bad or failure about yourself  0    Trouble concentrating 0    Moving slowly or fidgety/restless 0    Suicidal thoughts 0    PHQ-9 Score 0    Difficult doing work/chores Not difficult at all       CHA2DS2/VAS Stroke Risk Points  Current as of 32 minutes ago     8 >= 2 Points: High Risk  1 - 1.99 Points: Medium Risk  0 Points: Low Risk    No Change        Points Metrics  1 Has Congestive Heart Failure:  Yes    Current as of 32 minutes ago  0 Has Vascular Disease:  No    Current as of 32 minutes ago  1 Has Hypertension:  Yes    Current as of 32 minutes ago  2 Age:  51    Current as of 32 minutes ago  1 Has Diabetes:  Yes    Current as of 32 minutes ago  2 Had Stroke:  No  Had TIA:  No  Had Thromboembolism:  Yes    Current as of 32 minutes ago  1 Female:  Yes    Current as of 32 minutes  ago     Social History   Tobacco Use  Smoking Status Never  Smokeless Tobacco Never   BP Readings from Last 3 Encounters:  03/29/22 (!) 148/97  03/22/22 138/62  03/14/22 (!) 125/59   Pulse Readings from Last 3 Encounters:  03/29/22 77  03/14/22 (!) 57  03/09/22 82  Wt Readings from Last 3 Encounters:  03/29/22 198 lb (89.8 kg)  03/22/22 198 lb 6.4 oz (90 kg)  03/14/22 196 lb (88.9 kg)   BMI Readings from Last 3 Encounters:  03/29/22 33.99 kg/m  03/22/22 34.06 kg/m  03/14/22 33.64 kg/m    Assessment/Interventions: Review of patient past medical history, allergies, medications, health status, including review of consultants reports, laboratory and other test data, was performed as part of comprehensive evaluation and provision of chronic care management services.   SDOH:  (Social Determinants of Health) assessments and interventions performed: No SDOH Interventions    Flowsheet Row Clinical Support from 02/28/2022 in [place] Management from 12/27/2020 in [place] at [place] from 03/25/2017 in [place] at [place] Interventions     Food Insecurity Interventions Intervention Not Indicated -- --  Transportation Interventions Intervention Not Indicated -- --  Depression Interventions/Treatment  -- -- --  [pt is being referred to PCP for further evaluation]  Financial Strain Interventions Other (Comment)  [cancer foundations] Intervention Not Indicated --      SDOH Screenings   Food Insecurity: No Food Insecurity (02/28/2022)  Transportation Needs: No Transportation Needs (02/28/2022)  Depression (PHQ2-9): Low Risk  (11/03/2021)  Financial Resource Strain: Medium Risk (02/28/2022)  Tobacco Use: Low Risk  (03/30/2022)    CCM Care Plan  Allergies  Allergen Reactions   Dilantin [Phenytoin] Swelling    facial swelling   Latex Hives   Oxycodone Nausea And Vomiting and  Nausea Only    Abdominal Pain, Vomiting   Penicillins Nausea And Vomiting and Swelling    Has patient had a PCN reaction causing immediate rash, facial/tongue/throat swelling, SOB or lightheadedness with hypotension patient had a PCN reaction causing severe rash involving mucus membranes or skin necrosis: NO:[id number]} Has patient had a PCN reaction that required hospitalization/No Has patient had a PCN reaction occurring within the last 10 years: No If all of the above answers are "NO", then may proceed with Cephalosporin use.      Codeine Nausea Only    Tolerates tylenol with codeine   Hydrocodone Nausea And Vomiting   Nickel Hives and Rash   Pregabalin Itching and Other (See Comments)    headaches/problem w/vision    Medications Reviewed Today     Reviewed by [name], [facility]: Lambert Rhonda W (Pharmacist) on 03/27/22 at [phone]  Med List Status: <None>   Medication Order Taking? Sig Documenting Provider Last Dose Status Informant  acetaminophen (TYLENOL) 500 MG tablet [id number] Yes Take 500-1,000 mg by mouth every 6 (six) hours as needed for moderate pain. [provider] Taking Active Self  albuterol (VENTOLIN HFA) 108 (90 Base) MCG/ACT inhaler [id number] Yes INHALE 2 PUFFS BY MOUTH EVERY 6 HOURS AS NEEDED FOR WHEEZING OR SHORTNESS OF BREATH Kozlow, Alicia Poag, MD Taking Active Self  Artificial Saliva (BIOTENE DRY MOUTH) Doreene Eland [id number] Yes Use as directed 1 lozenge in the mouth or throat daily as needed (dry mouth). [provider] Taking Active Self  atorvastatin (LIPITOR) 10 MG tablet [id number] Yes TAKE 1 TABLET(10 MG) BY MOUTH DAILY Bedsole, Amy E, MD Taking Active Self  Blood Glucose Monitoring Suppl (ONETOUCH VERIO) w/Device KIT [id number] Yes Use to check blood sugar up to 2 times a day Bedsole, Amy E, MD Taking Active Self  Coenzyme Q10 (COQ10) 100 MG CAPS [id number] Yes Take 100 mg by mouth daily. [provider] Taking Active Self  cyclobenzaprine (FLEXERIL) 10 MG  tablet  [id number] Yes Take 0.5-1 tablets (5-10 mg total) by mouth at bedtime as needed for muscle spasms. Alicia Sanders, MD Taking Active Self  Empagliflozin-linaGLIPtin (GLYXAMBI) 10-5 MG TABS [id number] Yes Take 1 tablet by mouth daily. Alicia Sanders, MD Taking Active Self  EPINEPHrine 0.3 mg/0.3 mL IJ SOAJ injection [id number] Yes USE AS DIRECTED FOR LIFE THREATENING ALLERGIC REACTIONS Kozlow, Alicia Poag, MD Taking Active Self  estradiol (ESTRACE) 0.1 MG/GM vaginal cream [id number] Yes 0.5 g of cream intravaginally administered daily for 2 weeks, then reduce to twice weekly  Patient taking differently: Place 1 Applicatorful vaginally daily as needed (irritation).   Alicia Sanders, MD Taking Active Self  famotidine (PEPCID) 40 MG tablet [id number] Yes Take 1 (ONE) tablet by mouth every evening. Kozlow, Alicia Poag, MD Taking Active Self  fluticasone (FLOVENT HFA) 220 MCG/ACT inhaler [id number] Yes Inhale 2 puffs into the lungs 2 (two) times daily as needed (shortness of breath). [provider] Taking Active Self  fluticasone-salmeterol (ADVAIR HFA) 230-21 MCG/ACT inhaler [id number] Yes Inhale 2 puffs into the lungs 2 (two) times daily. [provider] Taking Active Self  gabapentin (NEURONTIN) 600 MG tablet [id number] Yes TAKE 1 TABLET BY MOUTH EVERY DAY AT BREAKFAST, 1 TABLET AT LUNCH AND 2 TABLETS AT BEDTIME Price, Alicia T, DPM Taking Active Self  glucose blood (ONETOUCH VERIO) test strip [id number] Yes 1 strip 2 (two) times daily [provider] Taking Active Self  hydrOXYzine (ATARAX/VISTARIL) 10 MG tablet [id number] Yes Take 1 tablet (10 mg total) by mouth daily as needed for itching. Alicia Sanders, MD Taking Active Self  ipratropium (ATROVENT) 0.06 % nasal spray [id number] Yes 1-2 sprays in each nostril 1-2 times per day as needed to dry nose Kozlow, Alicia Poag, MD Taking Active Self  irbesartan (AVAPRO) 150 MG tablet [id number] Yes Take 1 tablet (150 mg total) by mouth daily. Croitoru,  Mihai, MD Taking Active Self  Lactase (DAIRY-RELIEF PO) [id number] Yes Take 1 capsule by mouth daily as needed (eating dairy).  [provider] Taking Active Self  Lancets [name] DELICA PLUS [id number]) [place] [id number] Yes USE TO CHECK BLOOD SUGAR UP TO TWICE DAILY Diona Browner, Amy E, MD Taking Active Self  levalbuterol (XOPENEX) 1.25 MG/3ML nebulizer solution [id number] Yes USE 1 VIAL VIA NEBULIZER EVERY 6 HOURS AS NEEDED FOR SHORTNESS OF BREATH OR WHEEZING Kozlow, Alicia Poag, MD Taking Active Self  liver oil-zinc oxide (DESITIN) 40 % ointment [id number] Yes Apply 1 application topically as needed for irritation. [provider] Taking Active Self  MAGNESIUM GLYCINATE PO [id number] Yes Take 2 tablets by mouth 2 (two) times daily. [provider] Taking Active Self  Menthol, Topical Analgesic, (ABSORBINE PLUS JR EX) [id number] Yes Apply 1 patch topically daily as needed (pain). [provider] Taking Active Self  Menthol-Methyl Salicylate (SALONPAS JET SPRAY EX) [id number] Yes Apply 1 spray topically daily as needed (knee pain). [provider] Taking Active Self  METAMUCIL FIBER PO [id number] Yes Take 1 Dose by mouth daily. 1 dose = 1/2 tablespoon [provider] Taking Active Self  methimazole (TAPAZOLE) 5 MG tablet [id number] Yes Take 1 tablet (5 mg total) by mouth daily. Alicia Kingdom, MD Taking Active Self  montelukast (SINGULAIR) 10 MG tablet [id number] Yes Take 1 tablet (10 mg total) by mouth at bedtime. For asthma control. Kozlow, Alicia Poag, MD Taking Active Self  Mouthwashes Charlene Brooke DRY MOUTH MT) [id number] Yes Use as directed 1 Dose in the mouth or throat daily as needed (dry mouth). [provider] Taking Active Self  nystatin (MYCOSTATIN) 100000 UNIT/ML suspension [id number] Yes 45m swish and swallow after Advair 2 times daily. KJiles Prows MD Taking Active Self  omalizumab (Arvid Right injection 300 mg [id number]  KJiles Prows MD  Active             Med Note (Delsa Sale DUtahS   Fri Mar 09, 2022 10:58 AM)    ondansetron (ZOFRAN-ODT) 8 MG disintegrating tablet [id number] Take 1 tablet (8 mg total) by mouth every 8 (eight) hours as needed for nausea or vomiting. ALacretia Leigh MD Taking Active Self  OFriends HospitalVERIO test strip [id number] USE TO CHECK BLOOD SUGAR UP TO TWICE DAILY AS DIRECTED Bedsole, Amy E, MD Taking Active Self  oxybutynin (DITROPAN-XL) 10 MG 24 hr tablet [id number] Take 10 mg by mouth at bedtime. [provider] Taking Active Self           Med Note ([place] 5, 2023  4:23 PM) Takes oxybutynin and tolterodine both prescribed by Dr. MMatilde Sprang   pantoprazole (PROTONIX) 40 MG tablet [id number] Take 1 (ONE) tablet by mouth every morning. Kozlow, EDonnamarie Poag MD Taking Active Self  Plecanatide (TRULANCE) 3 MG TABS [id number] Take 3 mg by mouth daily as needed (constipation). [provider] Taking Active Self  Polyethyl Glycol-Propyl Glycol (SYSTANE OP) [id number] Place 1 drop into both eyes daily as needed (dry eyes). [provider] Taking Active Self  polyethylene glycol powder (GLYCOLAX/MIRALAX) 17 GM/SCOOP powder [id number] Take 1 Container by mouth once. [provider] Taking Active   PRESCRIPTION MEDICATION [id number] Inhale into the lungs at bedtime. CPAP [provider] Taking Active Self  Tiotropium Bromide Monohydrate (SPIRIVA RESPIMAT) 1.25 MCG/ACT AERS [id number] Inhale 2 each into the lungs daily. [provider] Taking Active Self  tolterodine (DETROL LA) 4 MG 24 hr capsule [id number] Take 4 mg by mouth at bedtime. [provider] Taking Active Self           Med Note (POutlookSep 5, 2023  4:23 PM) Takes oxybutynin and tolterodine both prescribed by Dr. MMatilde Sprang   traMADol (ULTRAM) 50 MG tablet [id number] Take 1-2 tablets (50-100 mg total) by mouth every 6 (six) hours as needed for  moderate pain or severe pain (50 mg for moderate, 100 mg for severe). GMichael Boston MD Taking Active Self  XARELTO 20 MG TABS tablet [id number] TAKE 1 TABLET(20 MG) BY MOUTH DAILY WITH SUPPER Croitoru, Mihai, MD Taking Active             Patient Active Problem List   Diagnosis Date Noted   Strain of left trapezius muscle 03/26/2022   Genetic testing 03/16/2022   Family history of breast cancer 02/28/2022   Family history of colon cancer in mother 02/28/2022   Ductal carcinoma in situ (DCIS) of left breast 02/23/2022   Family history of malignant neoplasm of digestive organs 02/02/2022   Flatulence, eructation and gas pain 02/02/2022   Anemia 12/29/2021   Chronic constipation 12/29/2021   Leukocytosis 12/19/2021   Chronic anticoagulation - Xerelto 12/18/2021   Hyperthyroidism 12/18/2021   Right ankle pain 12/15/2021   Vaginal burning 12/12/2021   Status post total left knee replacement 12/09/2021   Aftercare following joint replacement 10/30/2021   Presence of right artificial knee joint 10/30/2021   Constipation by delayed colonic transit 09/25/2021   Concussion with no loss  of consciousness 08/15/2021   Atypical atrial flutter ([place]) 06/24/2021   Chronic diastolic heart failure ([place]) 06/24/2021   Epistaxis 05/25/2021   Left upper arm pain 03/14/2021   Gastroenteritis 01/24/2021   Contusion of right lower leg 11/25/2020   Persistent atrial fibrillation (HCC)    SOB (shortness of breath) 10/14/2020   Severe persistent asthma with (acute) exacerbation 10/07/2020   Skin ulcer of buttock, limited to breakdown of skin ([place]) 08/05/2020   Controlled type 2 diabetes mellitus without complication ([place]) 20/94/7096   Arm pain, lateral, right 06/03/2020   Vaginal irritation 02/16/2020   High risk medication use 02/02/2020   Penicillin allergy 01/19/2020   Discitis of lumbosacral region 01/04/2020   Hyperlipidemia associated with type 2 diabetes mellitus ([place]) 05/07/2019    Chronic pain 12/11/2018   Obstructive sleep apnea treated with continuous positive airway pressure (CPAP) 06/03/2018   Encounter for chronic pain management 04/01/2018   Left thyroid nodule 02/06/2018   Insomnia secondary to chronic pain 01/26/2018   Class 3 drug-induced obesity with serious comorbidity and body mass index (BMI) of 40.0 to 44.9 in adult ([place]) 12/03/2017   Graves disease 10/15/2017   Mild obstructive sleep apnea 10/15/2017   Hot flashes not due to menopause 09/27/2017   Diarrhea 01/04/2017   Acute midline low back pain without sciatica 12/03/2016   Generalized abdominal pain 11/26/2016   Dry mouth 11/01/2016   Bilateral high frequency sensorineural hearing loss 10/24/2016   Subjective tinnitus, bilateral 10/24/2016   Candidal intertrigo 07/24/2016   Groin pain 04/27/2016   Urge incontinence 03/27/2016   Leg cramps 08/23/2015   Moderate persistent asthma 07/12/2015   Paroxysmal atrial fibrillation ([place]) 06/03/2015   Counseling regarding end of life decision making 05/05/2015   Neuropathy due to secondary diabetes ([place]) 01/11/2015   Pain in Achilles tendon 01/11/2015   Fall 11/09/2014   Migraine headache without aura 08/24/2014   Allergic rhinitis 03/25/2014   DVT (deep venous thrombosis) hx of  03/25/2014   Osteoarthritis of right knee 03/25/2014    ? of Seizure disorder 03/25/2014   Depression, major, in remission ([place]) 03/25/2014   Diabetes mellitus with neuropathy ([place]) 03/25/2014   Essential hypertension 03/25/2014   GERD (gastroesophageal reflux disease) 03/25/2014   History of DVT (deep vein thrombosis) 03/25/2014   History of pulmonary embolism 10/09/2013   Hypercoagulable state ([place]) 01/29/2011   Asthma, moderate persistent 01/24/2011   Pulmonary hypertension ([place]) 01/11/2011    Immunization History  Administered Date(s) Administered   Fluad Quad(high Dose 65+) 04/10/2019   Influenza,inj,Quad PF,6+ Mos 03/25/2014, 03/22/2016, 04/04/2017, 04/01/2018,  06/03/2020, 04/27/2021, 03/09/2022   Influenza,inj,quad, With Preservative 03/16/2018   Influenza-Unspecified 04/13/2015, 03/18/2017   PFIZER(Purple Top)SARS-COV-2 Vaccination 01/04/2020, 01/25/2020, 08/04/2020, 02/08/2021   Pfizer Covid-19 Vaccine Bivalent Booster 28yr & up 06/30/2021   Pneumococcal Conjugate-13 05/05/2015   Pneumococcal Polysaccharide-23 04/03/2012   Tdap 10/19/2013   Zoster, Live 05/11/2013    Conditions to be addressed/monitored:  Hypertension, Hyperlipidemia, Diabetes, Atrial Fibrillation, Heart Failure, Asthma, Osteoarthritis, and Overactive Bladder  Care Plan : [place] Updates made by FCharlton Price RMartinsince 04/04/2022 12:00 AM     Problem: Hypertension, Hyperlipidemia, Diabetes, Atrial Fibrillation, Heart Failure, Asthma, Osteoarthritis, and Overactive Bladder      Long-Range Goal: Disease Management   Start Date: 12/30/2020  Expected End Date: 04/05/2023  This Visit's Progress: On track  Priority: High  Note:   Current Barriers:  None identified  Pharmacist Clinical Goal(s):  Patient will contact provider office for  questions/concerns as evidenced notation of same in electronic health record through collaboration with PharmD and provider.   Interventions: 1:1 collaboration with Alicia Sanders, MD regarding development and update of comprehensive plan of care as evidenced by provider attestation and co-signature Inter-disciplinary care team collaboration (see longitudinal plan of care) Comprehensive medication review performed; medication list updated in electronic medical record  Heart Failure / Hypertension (Goal: BP < 140/90) -Controlled - pt has not needed furosemide, so she is not taking potassium either -Current home BP/HR readings: n/a -Last ejection fraction: 60-65% (Date: 10/2020) - per Dr Sallyanne Kuster she does not have significant LV diastolic dysfunction, issues related to OSA and Afib. -HF type: Diastolic; NYHA Class:  n/a -Current treatment: Irbesartan 150 mg daily - Appropriate, Effective, Safe, Accessible Furosemide 20 mg daily - not taking Potassium - not taking Empagliflozin 10 mg (part of Glyxambi) - Appropriate, Effective, Safe, Accessible -Medications previously tried: amlodipine, diltiazem, valsartan  -Educated on Benefits of medications for managing symptoms and prolonging life Importance of weighing daily; if you gain more than 3 pounds in one day or 5 pounds in one week, contact provider -Recommended to continue current medication  Atrial Fibrillation (Goal: prevent stroke and major bleeding) -Controlled - managed by cardiology -Hx DVT as well -Current treatment: Xarelto 15 mg daily PM - Appropriate, Effective, Safe, Accessible -Medications previously tried: none reported -Xarelto - encouraged to take same time every day with largest meal. She is taking in morning at various times (8A-12P). Denies aspirin, aleve, or other NSAID use. The only thing she takes for pain is Tylenol.  -Counseled on importance of adherence to anticoagulant exactly as prescribed; bleeding risk associated with Xarelto and importance of self-monitoring for signs/symptoms of bleeding; avoidance of NSAIDs due to increased bleeding risk with anticoagulants; -Recommended to continue current medication  Hyperlipidemia: (LDL goal < 100) -Controlled - LDL 49 (04/2021) at goal -Current treatment: Atorvastatin 10 mg daily - Appropriate, Effective, Safe, Accessible CoQ10 - Appropriate, Effective, Safe, Accessible -Medications previously tried: n/a  -Educated on Cholesterol goals;  -Recommended to continue current medication  Diabetes (A1c goal <7%) -Controlled - A1c 5.6% (03/2022) at goal in normal range -Current home glucose readings: typically does not check BG, but has been checking with surgery coming up this (breast cancer) fasting glucose: 84 -Denies hypoglycemic/hyperglycemic symptoms -Current  medications: Glyxambi (empagliflozin-linagliptin) 10-5 mg daily - Query Appropriate -Medications previously tried: metformin  -Educated on A1c and blood sugar goals - A1c is in normal range, she may not need combo therapy much longer; would consider dropping Tradjenta component of Glyxambi if A1c remains < 6% at follow up, would continue Jardiance for HF benefit regardless of A1c  Asthma (Goal: control symptoms and prevent exacerbations) -Controlled -Follows with allergy/asthma (Dr [name]) - per MD (03/21/22) she used Advair + Spiriva consistently and adds on albuterol and Flovent during increased asthma activity -Pulmonary function testing: not on file -Exacerbations requiring treatment in last 6 months: 0 -Current treatment  Albuterol HFA PRN - Appropriate, Effective, Safe, Accessible Advair 230-21 mcg  HFA 2 puffs BID -Appropriate, Effective, Safe, Accessible Spiriva Respimat 1.25 mcg 2 puff daily -Appropriate, Effective, Safe, Accessible Flovent 220 mcg HFA 2 puffs BID (PRN) -Appropriate, Effective, Safe, Accessible Levalbuterol neb PRN -Appropriate, Effective, Safe, Accessible Montelukast 10 mg daily -Appropriate, Effective, Safe, Accessible Famotidine 40 mg daily -Appropriate, Effective, Safe, Accessible Ipratropium 0.06% nasal spray -Appropriate, Effective, Safe, Accessible Hydroxyzine 10 mg PRN itching -Appropriate, Effective, Safe, Accessible Xolair injections -Appropriate, Effective, Safe, Accessible Epi-Pen PRN -Medications  previously tried: n/a  -Patient reports consistent use of maintenance inhaler -Counseled on Differences between maintenance and rescue inhalers -Recommended to continue current medication  OAB / Urinary incontinence (Goal: manage symptoms) -Controlled - per pt report; it helps that she is not needing furosemide right now -Follows with Dr Matilde Sprang -Current treatment  Oxbutynin ER 10 mg daily -Appropriate, Effective, Safe, Accessible Tolterodine ER 4 mg  daily -Appropriate, Effective, Safe, Accessible -Medications previously tried: Myrbetriq, fesoterodine, solifenacin -Recommended to continue current medication  Chronic constipation (Goal: regular BM) -Controlled - per pt report she is having 1 BM daily right now; she is not using Trulance regularly -Current treatment  Miralax daily - Appropriate, Effective, Safe, Accessible Trulance 3 mg PRN -Appropriate, Effective, Safe, Accessible Metamucil PRN - Appropriate, Effective, Safe, Accessible -Medications previously tried: n/a -Recommended to continue current medication  Osteoarthritis (Goal: Reduce pain) -Not ideally controlled- per pt report -s/p knee surgery 09/2021; she uses ice and heat as well -Current treatment  Tylenol 650 mg - 2 tab PRN - Appropriate, Effective, Safe, Accessible -Medications previously tried: multiple  -Recommend to continue current medication  Breast cancer (DCIS) -Dx 02/23/2022; resection scheduled this week; advised continued follow up with oncology as scheduled  Patient Goals/Self-Care Activities Patient will:  - take medications as prescribed as evidenced by patient report and record review focus on medication adherence by routine       Medication Assistance: None required.  Patient affirms current coverage meets needs.  Compliance/Adherence/Medication fill history: Care Gaps: DEXA scan (due 05/17/20)  Star-Rating Drugs: Atorvastatin - PDC 96% Irbesartan - PDC 100%  Medication Access: Within the past 30 days, how often has patient missed a dose of medication? 0 Is a pillbox or other method used to improve adherence? Yes  Factors that may affect medication adherence? no barriers identified Are meds synced by current pharmacy? No  Are meds delivered by current pharmacy? No  Does patient experience delays in picking up medications due to transportation concerns? No   [facility] Services Reviewed: Is patient disadvantaged to use [facility]  Pharmacy?: Yes  Current Rx insurance plan: [place] Name and location of Current pharmacy:  [id number] [address], [name] [address] Phone: [phone] Fax: [name] [address] Phone: [phone] Fax: [phone]  [facility] Pharmacy services reviewed with patient today?: No  Patient requests to transfer care to [facility] Pharmacy?: No  Reason patient declined to change pharmacies: Disadvantaged due to insurance/mail order   Care Plan and Follow Up Patient Decision:  Patient agrees to Care Plan and Follow-up.  Plan: Telephone follow up appointment with care management team member scheduled for:  6 months  Charlene Brooke, PharmD, BCACP Clinical Pharmacist [facility] [phone]

## 2022-03-28 DIAGNOSIS — C50412 Malignant neoplasm of upper-outer quadrant of left female breast: Secondary | ICD-10-CM | POA: Diagnosis not present

## 2022-03-29 ENCOUNTER — Encounter (HOSPITAL_COMMUNITY)
Admission: RE | Disposition: A | Discharge status: Home / Self Care | Payer: Self-pay | Source: Home / Self Care | Attending: Surgery

## 2022-03-29 ENCOUNTER — Other Ambulatory Visit: Payer: Self-pay

## 2022-03-29 ENCOUNTER — Ambulatory Visit (HOSPITAL_COMMUNITY): Payer: Medicare Other | Admitting: Physician Assistant

## 2022-03-29 ENCOUNTER — Ambulatory Visit (HOSPITAL_BASED_OUTPATIENT_CLINIC_OR_DEPARTMENT_OTHER): Payer: Medicare Other | Admitting: Anesthesiology

## 2022-03-29 ENCOUNTER — Ambulatory Visit (HOSPITAL_COMMUNITY)
Admission: RE | Admit: 2022-03-29 | Discharge: 2022-03-29 | Disposition: A | Discharge status: Home / Self Care | Payer: Medicare Other | Attending: Surgery | Admitting: Surgery

## 2022-03-29 ENCOUNTER — Encounter (HOSPITAL_COMMUNITY): Payer: Self-pay | Admitting: Surgery

## 2022-03-29 DIAGNOSIS — I110 Hypertensive heart disease with heart failure: Secondary | ICD-10-CM | POA: Diagnosis not present

## 2022-03-29 DIAGNOSIS — K219 Gastro-esophageal reflux disease without esophagitis: Secondary | ICD-10-CM | POA: Diagnosis not present

## 2022-03-29 DIAGNOSIS — I509 Heart failure, unspecified: Secondary | ICD-10-CM | POA: Insufficient documentation

## 2022-03-29 DIAGNOSIS — I272 Pulmonary hypertension, unspecified: Secondary | ICD-10-CM | POA: Insufficient documentation

## 2022-03-29 DIAGNOSIS — Z86711 Personal history of pulmonary embolism: Secondary | ICD-10-CM | POA: Insufficient documentation

## 2022-03-29 DIAGNOSIS — I5030 Unspecified diastolic (congestive) heart failure: Secondary | ICD-10-CM | POA: Diagnosis not present

## 2022-03-29 DIAGNOSIS — Z79899 Other long term (current) drug therapy: Secondary | ICD-10-CM | POA: Insufficient documentation

## 2022-03-29 DIAGNOSIS — E119 Type 2 diabetes mellitus without complications: Secondary | ICD-10-CM

## 2022-03-29 DIAGNOSIS — E1169 Type 2 diabetes mellitus with other specified complication: Secondary | ICD-10-CM | POA: Diagnosis not present

## 2022-03-29 DIAGNOSIS — Z86718 Personal history of other venous thrombosis and embolism: Secondary | ICD-10-CM | POA: Insufficient documentation

## 2022-03-29 DIAGNOSIS — Z803 Family history of malignant neoplasm of breast: Secondary | ICD-10-CM | POA: Diagnosis not present

## 2022-03-29 DIAGNOSIS — J4540 Moderate persistent asthma, uncomplicated: Secondary | ICD-10-CM | POA: Diagnosis not present

## 2022-03-29 DIAGNOSIS — D0512 Intraductal carcinoma in situ of left breast: Secondary | ICD-10-CM | POA: Diagnosis not present

## 2022-03-29 DIAGNOSIS — E1140 Type 2 diabetes mellitus with diabetic neuropathy, unspecified: Secondary | ICD-10-CM | POA: Diagnosis not present

## 2022-03-29 DIAGNOSIS — E0500 Thyrotoxicosis with diffuse goiter without thyrotoxic crisis or storm: Secondary | ICD-10-CM | POA: Insufficient documentation

## 2022-03-29 DIAGNOSIS — G4733 Obstructive sleep apnea (adult) (pediatric): Secondary | ICD-10-CM | POA: Diagnosis not present

## 2022-03-29 DIAGNOSIS — C50912 Malignant neoplasm of unspecified site of left female breast: Secondary | ICD-10-CM | POA: Diagnosis not present

## 2022-03-29 DIAGNOSIS — I5032 Chronic diastolic (congestive) heart failure: Secondary | ICD-10-CM | POA: Diagnosis not present

## 2022-03-29 DIAGNOSIS — I4891 Unspecified atrial fibrillation: Secondary | ICD-10-CM | POA: Diagnosis not present

## 2022-03-29 HISTORY — PX: BREAST LUMPECTOMY WITH RADIOACTIVE SEED LOCALIZATION: SHX6424

## 2022-03-29 LAB — GLUCOSE, CAPILLARY: Glucose-Capillary: 104 mg/dL — ABNORMAL HIGH (ref 70–99)

## 2022-03-29 SURGERY — BREAST LUMPECTOMY WITH RADIOACTIVE SEED LOCALIZATION
Anesthesia: General | Site: Breast | Laterality: Left

## 2022-03-29 MED ORDER — VANCOMYCIN HCL IN DEXTROSE 1-5 GM/200ML-% IV SOLN
1000.0000 mg | INTRAVENOUS | Status: AC
  Administered 2022-03-29: 1000 mg via INTRAVENOUS
  Filled 2022-03-29: qty 200

## 2022-03-29 MED ORDER — FENTANYL CITRATE (PF) 250 MCG/5ML IJ SOLN
INTRAMUSCULAR | Status: DC | PRN
  Administered 2022-03-29: 25 ug via INTRAVENOUS

## 2022-03-29 MED ORDER — ROCURONIUM BROMIDE 10 MG/ML (PF) SYRINGE
PREFILLED_SYRINGE | INTRAVENOUS | Status: AC
  Filled 2022-03-29: qty 10

## 2022-03-29 MED ORDER — BUPIVACAINE-EPINEPHRINE 0.25% -1:200000 IJ SOLN
INTRAMUSCULAR | Status: DC | PRN
  Administered 2022-03-29: 20 mL

## 2022-03-29 MED ORDER — LIDOCAINE 2% (20 MG/ML) 5 ML SYRINGE
INTRAMUSCULAR | Status: DC | PRN
  Administered 2022-03-29: 50 mg via INTRAVENOUS
  Administered 2022-03-29: 150 mg via INTRAVENOUS

## 2022-03-29 MED ORDER — LIDOCAINE 2% (20 MG/ML) 5 ML SYRINGE
INTRAMUSCULAR | Status: AC
  Filled 2022-03-29: qty 5

## 2022-03-29 MED ORDER — CHLORHEXIDINE GLUCONATE CLOTH 2 % EX PADS: 6.0000 | MEDICATED_PAD | Freq: Once | CUTANEOUS | Status: DC

## 2022-03-29 MED ORDER — FENTANYL CITRATE (PF) 250 MCG/5ML IJ SOLN
INTRAMUSCULAR | Status: AC
  Filled 2022-03-29: qty 5

## 2022-03-29 MED ORDER — SUCCINYLCHOLINE CHLORIDE 200 MG/10ML IV SOSY
PREFILLED_SYRINGE | INTRAVENOUS | Status: AC
  Filled 2022-03-29: qty 10

## 2022-03-29 MED ORDER — PROPOFOL 10 MG/ML IV BOLUS
INTRAVENOUS | Status: AC
  Filled 2022-03-29: qty 20

## 2022-03-29 MED ORDER — PHENYLEPHRINE 80 MCG/ML (10ML) SYRINGE FOR IV PUSH (FOR BLOOD PRESSURE SUPPORT)
PREFILLED_SYRINGE | INTRAVENOUS | Status: AC
  Filled 2022-03-29: qty 10

## 2022-03-29 MED ORDER — TRAMADOL HCL 50 MG PO TABS: 50.0000 mg | ORAL_TABLET | Freq: Four times a day (QID) | ORAL | 0 refills | Status: DC | PRN

## 2022-03-29 MED ORDER — ACETAMINOPHEN 500 MG PO TABS
1000.0000 mg | ORAL_TABLET | Freq: Once | ORAL | Status: AC
  Administered 2022-03-29: 1000 mg via ORAL
  Filled 2022-03-29: qty 2

## 2022-03-29 MED ORDER — DEXAMETHASONE SODIUM PHOSPHATE 10 MG/ML IJ SOLN
INTRAMUSCULAR | Status: DC | PRN
  Administered 2022-03-29: 4 mg via INTRAVENOUS

## 2022-03-29 MED ORDER — CHLORHEXIDINE GLUCONATE 0.12 % MT SOLN
15.0000 mL | Freq: Once | OROMUCOSAL | Status: AC
  Administered 2022-03-29: 15 mL via OROMUCOSAL
  Filled 2022-03-29: qty 15

## 2022-03-29 MED ORDER — BUPIVACAINE-EPINEPHRINE (PF) 0.25% -1:200000 IJ SOLN
INTRAMUSCULAR | Status: AC
  Filled 2022-03-29: qty 30

## 2022-03-29 MED ORDER — ONDANSETRON HCL 4 MG/2ML IJ SOLN
INTRAMUSCULAR | Status: AC
  Filled 2022-03-29: qty 2

## 2022-03-29 MED ORDER — ONDANSETRON HCL 4 MG/2ML IJ SOLN
INTRAMUSCULAR | Status: DC | PRN
  Administered 2022-03-29: 4 mg via INTRAVENOUS

## 2022-03-29 MED ORDER — ORAL CARE MOUTH RINSE: 15.0000 mL | Freq: Once | OROMUCOSAL | Status: AC

## 2022-03-29 MED ORDER — 0.9 % SODIUM CHLORIDE (POUR BTL) OPTIME
TOPICAL | Status: DC | PRN
  Administered 2022-03-29: 1000 mL

## 2022-03-29 MED ORDER — DEXAMETHASONE SODIUM PHOSPHATE 10 MG/ML IJ SOLN
INTRAMUSCULAR | Status: AC
  Filled 2022-03-29: qty 1

## 2022-03-29 MED ORDER — LACTATED RINGERS IV SOLN: INTRAVENOUS | Status: DC

## 2022-03-29 SURGICAL SUPPLY — 36 items
ADH SKN CLS APL DERMABOND .7 (GAUZE/BANDAGES/DRESSINGS) ×2
APL PRP STRL LF DISP 70% ISPRP (MISCELLANEOUS) ×1
APPLIER CLIP 9.375 MED OPEN (MISCELLANEOUS) ×1
APR CLP MED 9.3 20 MLT OPN (MISCELLANEOUS) ×1
BAG COUNTER SPONGE SURGICOUNT (BAG) ×1 IMPLANT
BAG SPNG CNTER NS LX DISP (BAG) ×1
BINDER BREAST XLRG (GAUZE/BANDAGES/DRESSINGS) IMPLANT
CANISTER SUCT 3000ML PPV (MISCELLANEOUS) IMPLANT
CHLORAPREP W/TINT 26 (MISCELLANEOUS) ×1 IMPLANT
CLIP APPLIE 9.375 MED OPEN (MISCELLANEOUS) IMPLANT
COVER PROBE W GEL 5X96 (DRAPES) ×1 IMPLANT
COVER SURGICAL LIGHT HANDLE (MISCELLANEOUS) ×1 IMPLANT
DERMABOND ADVANCE IMPLANT
DERMABOND ADVANCED .7 DNX12 (GAUZE/BANDAGES/DRESSINGS) ×1 IMPLANT
DEVICE DUBIN SPECIMEN MAMMOGRA (MISCELLANEOUS) ×1 IMPLANT
DRAPE CHEST BREAST 15X10 FENES (DRAPES) ×1 IMPLANT
DRSG PAD ABDOMINAL 8X10 ST (GAUZE/BANDAGES/DRESSINGS) IMPLANT
ELECT CAUTERY BLADE 6.4 (BLADE) ×1 IMPLANT
ELECT REM PT RETURN 9FT ADLT (ELECTROSURGICAL) ×1
ELECTRODE REM PT RTRN 9FT ADLT (ELECTROSURGICAL) ×1 IMPLANT
GLOVE BIOGEL PI IND STRL 8 (GLOVE) ×1 IMPLANT
GOWN STRL REUS W/ TWL LRG LVL3 (GOWN DISPOSABLE) ×1 IMPLANT
GOWN STRL REUS W/ TWL XL LVL3 (GOWN DISPOSABLE) ×1 IMPLANT
GOWN STRL REUS W/TWL LRG LVL3 (GOWN DISPOSABLE) ×1
GOWN STRL REUS W/TWL XL LVL3 (GOWN DISPOSABLE) ×1
KIT BASIN OR (CUSTOM PROCEDURE TRAY) ×1 IMPLANT
KIT MARKER MARGIN INK (KITS) IMPLANT
NDL HYPO 25GX1X1/2 BEV (NEEDLE) IMPLANT
NEEDLE HYPO 25GX1X1/2 BEV (NEEDLE) ×1 IMPLANT
NS IRRIG 1000ML POUR BTL (IV SOLUTION) IMPLANT
PACK GENERAL/GYN (CUSTOM PROCEDURE TRAY) ×1 IMPLANT
SUT MNCRL AB 4-0 PS2 18 (SUTURE) ×1 IMPLANT
SUT VIC AB 3-0 SH 27 (SUTURE) ×1
SUT VIC AB 3-0 SH 27X BRD (SUTURE) IMPLANT
SUT VIC AB 3-0 SH 8-18 (SUTURE) ×1 IMPLANT
SYR CONTROL 10ML LL (SYRINGE) IMPLANT

## 2022-03-29 NOTE — Discharge Instructions (Signed)
[place] Office Phone Number [phone]  BREAST BIOPSY/ PARTIAL MASTECTOMY: POST OP INSTRUCTIONS  Always review your discharge instruction sheet given to you by the facility where your surgery was performed.  IF YOU HAVE DISABILITY OR FAMILY LEAVE FORMS, YOU MUST BRING THEM TO THE OFFICE FOR PROCESSING.  DO NOT GIVE THEM TO YOUR DOCTOR.  A prescription for pain medication may be given to you upon discharge.  Take your pain medication as prescribed, if needed.  If narcotic pain medicine is not needed, then you may take acetaminophen (Tylenol) or ibuprofen (Advil) as needed. Take your usually prescribed medications unless otherwise directed If you need a refill on your pain medication, please contact your pharmacy.  They will contact our office to request authorization.  Prescriptions will not be filled after 5pm or on week-ends. You should eat very light the first 24 hours after surgery, such as soup, crackers, pudding, etc.  Resume your normal diet the day after surgery. Most patients will experience some swelling and bruising in the breast.  Ice packs and a good support bra will help.  Swelling and bruising can take several days to resolve.  It is common to experience some constipation if taking pain medication after surgery.  Increasing fluid intake and taking a stool softener will usually help or prevent this problem from occurring.  A mild laxative (Milk of Magnesia or Miralax) should be taken according to package directions if there are no bowel movements after 48 hours. Unless discharge instructions indicate otherwise, you may remove your bandages 24-48 hours after surgery, and you may shower at that time.  You may have steri-strips (small skin tapes) in place directly over the incision.  These strips should be left on the skin for 7-10 days.  If your surgeon used skin glue on the incision, you may shower in 24 hours.  The glue will flake off over the next 2-3 weeks.  Any  sutures or staples will be removed at the office during your follow-up visit. ACTIVITIES:  You may resume regular daily activities (gradually increasing) beginning the next day.  Wearing a good support bra or sports bra minimizes pain and swelling.  You may have sexual intercourse when it is comfortable. You may drive when you no longer are taking prescription pain medication, you can comfortably wear a seatbelt, and you can safely maneuver your car and apply brakes. RETURN TO WORK:  ______________________________________________________________________________________ Dennis Bast should see your doctor in the office for a follow-up appointment approximately two weeks after your surgery.  Your doctor's nurse will typically make your follow-up appointment when she calls you with your pathology report.  Expect your pathology report 2-3 business days after your surgery.  You may call to check if you do not hear from [place] after three days. OTHER INSTRUCTIONS: _______________________________________________________________________________________________ _____________________________________________________________________________________________________________________________________ _____________________________________________________________________________________________________________________________________ _____________________________________________________________________________________________________________________________________  WHEN TO CALL YOUR DOCTOR: Fever over 101.0 Nausea and/or vomiting. Extreme swelling or bruising. Continued bleeding from incision. Increased pain, redness, or drainage from the incision.  The clinic staff is available to answer your questions during regular business hours.  Please don't hesitate to call and ask to speak to one of the nurses for clinical concerns.  If you have a medical emergency, go to the nearest emergency room or call 911.  A surgeon from [facility] is always on call at the hospital.    Restart blood thinner in 48 hours        For further questions, please visit [email or web address]

## 2022-03-29 NOTE — Op Note (Signed)
Preoperative diagnosis: Left breast DCIS upper outer quadrant  Postoperative diagnosis: Same  Procedure: Left breast seed localized lumpectomy  Surgeon: Erroll Luna, MD  Anesthesia: LMA with 0.25% Marcaine with epinephrine  EBL: 10 cc  Drains: None  Specimen: Left breast tissue with seed and clip verified by Faxitron  IV fluids: Per anesthesia record  Indications for procedure: The patient is a 76 year old female with left breast DCIS.  She presents today for lumpectomy for definitive surgical treatment.The procedure has been discussed with the patient. Alternatives to surgery have been discussed with the patient.  Risks of surgery include bleeding,  Infection,  Seroma formation, death,  and the need for further surgery.   The patient understands and wishes to proceed.    Description of procedure: The patient was met in the holding area and questions were answered.  Of note a seed was placed as an outpatient.  Left side was marked as correct site and all questions were answered.  She was then taken back to the operating room.  She is placed upon upon the OR table.  After induction of general anesthesia, left breast was prepped and draped in a sterile fashion and a timeout was performed.  She received appropriate preoperative antibiotics.  Films were available for review.  Neoprobe was used to identify the seed left breast upper outer quadrant.  Local anesthetic was infiltrated in the skin and a curvilinear incision was made over the signal.  Dissection was carried down and all tissue around the seed and clip were excised with a grossly negative margin.  The Faxitron image revealed the seed and clip to be present.  I then shaved all margins and sent the separately except for the posterior margin which was muscle.  Hemostasis achieved with cautery.  Irrigation used.  Local anesthetic infiltrated throughout the cavity.  The deep layer of the cavity is closed with 3-0 Vicryl.  4 Monocryl was used  to close the skin in a subcuticular fashion.  Dermabond applied.  All counts found to be correct.  The patient was awoke extubated taken to recovery in satisfactory condition.

## 2022-03-29 NOTE — H&P (Signed)
Chief Complaint: Breast Cancer   History of Present Illness: [name] is a 76 y.o. female who is seen today as an office consultation for evaluation of Breast Cancer .   Patient seen in the [facility] today for newly diagnosed left breast DCIS diagnosed on recent screening mammogram. Patient was noted at the 1 o'clock position in the upper outer quadrant left breast to have a 1.2 cm density. Core biopsy showed DCIS with questionable microinvasion. She does have family history of breast cancer. Denies any history of breast pain breast mass or nipple discharge prior to this.  Review of Systems: A complete review of systems was obtained from the patient. I have reviewed this information and discussed as appropriate with the patient. See HPI as well for other ROS.    Medical History: Past Medical History:  Diagnosis Date  Anemia  Anesthesia complication  Wheezing a lot after GA  Arthritis  Chronic pain 12/11/2018  Clotting disorder (CMS-HCC)  Blood clot  Diabetes mellitus without complication (CMS-HCC)  GERD (gastroesophageal reflux disease) 09/25/2021  Graves disease 10/15/2017  Hematologic abnormality  Hyperlipidemia  Hypertension  Moderate persistent asthma, uncomplicated 99/83/3825  Obstructive sleep apnea (adult) (pediatric) 10/15/2017  Primary osteoarthritis of right knee 10/10/2021  Pulmonary hypertension (CMS-HCC) 01/11/2011  Sleep apnea  C-Pap   Patient Active Problem List  Diagnosis  GERD (gastroesophageal reflux disease)  Atypical atrial flutter (CMS-HCC)  Chronic diastolic heart failure (CMS-HCC)  Class 3 drug-induced obesity with serious comorbidity and body mass index (BMI) of 40.0 to 44.9 in adult (CMS-HCC)  Chronic pain  Controlled type 2 diabetes mellitus without complication (CMS-HCC)  Degeneration of lumbar intervertebral disc  Depression, major, in remission (CMS-HCC)  Neuropathy due to secondary diabetes (CMS-HCC)  Constipation by delayed colonic transit   History of DVT (deep vein thrombosis)  Graves disease  Hypertension associated with diabetes (CMS-HCC)  Hyperlipidemia associated with type 2 diabetes mellitus (CMS-HCC)  Moderate persistent asthma, uncomplicated  Obstructive sleep apnea (adult) (pediatric)  Pulmonary hypertension (CMS-HCC)  Presence of right artificial knee joint  Aftercare following joint replacement  Anemia  Bilateral high frequency sensorineural hearing loss  Chronic anticoagulation  Ductal carcinoma in situ (DCIS) of left breast  Family history of malignant neoplasm of digestive organs   Past Surgical History:  Procedure Laterality Date  ARTHROPLASTY TOTAL KNEE Right 10/10/2021  Procedure: ROBOT ASSISTED RIGHT TOTAL KNEE ARTHROPLASTY; Surgeon: Clovis Pu, MD; Location: [place]; Service: Orthopedics; Laterality: Right;  HYSTERECTOMY  partial  OTHER SURGERY  right foot  TONSILLECTOMY    Allergies  Allergen Reactions  Penicillins Anaphylaxis  Phenytoin Sodium Swelling  Hydrocodone-Acetaminophen Nausea, Vomiting and Nausea And Vomiting  Other reaction(s): vomiting  Latex, Natural Rubber Hives  Other reaction(s): hives  Nickel Rash and Other (See Comments)  Other reaction(s): hives  Oxycodone Abdominal Pain, Nausea, Vomiting and Nausea And Vomiting  Phenytoin Swelling  Other reaction(s): facial swelling  Pregabalin Other (See Comments) and Unknown  Blurred vision Other reaction(s): headaches/problem w/vision  Oxycodone-Acetaminophen Nausea, Vomiting and Nausea And Vomiting  Tylenol-Codeine #3 [Acetaminophen-Codeine] Nausea and Vomiting  Codeine Nausea  Tolerates tylenol with codeine  Hydrocodone Nausea And Vomiting   Current Outpatient Medications on File Prior to Visit  Medication Sig Dispense Refill  ADVAIR HFA 230-21 mcg/actuation inhaler Inhale 2 inhalations into the lungs every 12 (twelve) hours  albuterol 90 mcg/actuation inhaler Inhale 2 inhalations into the lungs every 6 (six)  hours as needed  atorvastatin (LIPITOR) 10 MG tablet Take 10 mg by mouth every morning  EPINEPHrine (EPIPEN) 0.3 mg/0.3 mL auto-injector Inject 0.3 mg into the muscle once as needed  famotidine (PEPCID) 40 MG tablet Take 40 mg by mouth at bedtime  FLOVENT HFA 220 mcg/actuation inhaler Inhale 2 inhalations into the lungs 2 (two) times daily as needed  FUROsemide (LASIX) 20 MG tablet Take 20 mg by mouth once daily as needed  gabapentin (NEURONTIN) 600 MG tablet Take 600-1,200 mg by mouth 3 (three) times daily Take '600mg'$  at breakfast and lunch, '1200mg'$  at dinner time  hydrOXYzine HCL (ATARAX) 10 MG tablet Take 10 mg by mouth once daily as needed for Itching  ipratropium (ATROVENT) 0.06 % nasal spray Place 2 sprays into both nostrils 2 (two) times daily as needed  irbesartan (AVAPRO) 150 MG tablet Take 150 mg by mouth at bedtime  LACTASE ORAL Take 1 tablet by mouth once daily as needed  levalbuterol (XOPENEX) 1.25 mg/3 mL nebulizer solution Take 1 ampule by nebulization every 6 (six) hours as needed  meclizine (ANTIVERT) 12.5 mg tablet Take 12.5 mg by mouth 3 (three) times daily as needed  methIMAzole (TAPAZOLE) 5 MG tablet Take 5 mg by mouth once daily  montelukast (SINGULAIR) 10 mg tablet Take 10 mg by mouth at bedtime  olopatadine (PATADAY) 0.2 % ophthalmic solution Place into both eyes once daily as needed  omalizumab (XOLAIR) 150 mg vial Inject 150 mg subcutaneously every 28 (twenty-eight) days Every 4 weeks  ondansetron (ZOFRAN-ODT) 4 MG disintegrating tablet Take 4 mg by mouth every 8 (eight) hours as needed  ONETOUCH VERIO TEST STRIPS test strip 1 strip 2 (two) times daily  oxybutynin (DITROPAN-XL) 5 MG XL tablet Take 5 mg by mouth at bedtime  pantoprazole (PROTONIX) 40 MG DR tablet Take 40 mg by mouth at bedtime  peg 400-propylene glycol (SYSTANE, PROPYLENE GLYCOL,) 0.4-0.3 % drops Place into both eyes once daily as needed  SPIRIVA RESPIMAT 1.25 mcg/actuation inhalation spray Inhale 2.5 mcg  into the lungs every morning  SUMAtriptan (IMITREX) 100 MG tablet Take 1 tablet by mouth once daily as needed  TRULANCE 3 mg tablet Take 3 mg by mouth every other day  XARELTO 20 mg tablet Take 20 mg by mouth daily with dinner  acetaminophen (TYLENOL) 325 MG tablet Take 3 tablets (975 mg total) by mouth every 8 (eight) hours 90 tablet 0  capsaicin-menthol 0.025-1.25 % PtMd Apply topically once daily as needed  cyclobenzaprine (FLEXERIL) 10 MG tablet Take 10 mg by mouth at bedtime  desloratadine (CLARINEX) 5 mg tablet Take 1 tablet by mouth at bedtime  dextromethorphan-guaifenesin (MUCINEX DM) 30-600 mg ER tablet Take 1 tablet by mouth 2 (two) times daily as needed  diclofenac (VOLTAREN) 1 % topical gel Apply 2 g topically 4 (four) times daily as needed  garlic 817 mg Tab Take 1 tablet by mouth once daily  GLYXAMBI 10-5 mg tablet Take 1 tablet by mouth every morning  hydrOXYzine HCL (ATARAX) 10 MG tablet Take 10 mg by mouth 3 (three) times daily as needed  methIMAzole (TAPAZOLE) 5 MG tablet Take 2.5 mg by mouth every morning  polyethylene glycol (MIRALAX) packet Take 17 g by mouth once daily as needed for Constipation Mix in 4-8ounces of fluid prior to taking.  tolterodine (DETROL LA) 4 MG LA capsule Take 4 mg by mouth every morning  zinc oxide-cod liver oil (DESITIN) 40 % ointment Apply topically as needed   No current facility-administered medications on file prior to visit.   Family History  Problem Relation Age of Onset  Heart  disease Mother  Breast cancer Mother  High blood pressure (Hypertension) Mother  Breast cancer Sister  High blood pressure (Hypertension) Sister  Myocardial Infarction (Heart attack) Brother    Social History   Tobacco Use  Smoking Status Never  Smokeless Tobacco Never    Social History   Socioeconomic History  Marital status: Widowed  Tobacco Use  Smoking status: Never  Smokeless tobacco: Never  Vaping Use  Vaping Use: Never used  Substance and  Sexual Activity  Alcohol use: Yes  Comment: 1 glass of wine nightly  Drug use: Never  Sexual activity: Defer   Social Determinants of Health   Transportation Needs: No Transportation Needs  Lack of Transportation (Medical): No  Lack of Transportation (Non-Medical): No   Objective:  There were no vitals filed for this visit.  There is no height or weight on file to calculate BMI.  Physical Exam HENT:  Head: Normocephalic.  Eyes:  Pupils: Pupils are equal, round, and reactive to light.  Cardiovascular:  Rate and Rhythm: Normal rate.  Pulmonary:  Effort: Pulmonary effort is normal.  Breath sounds: No stridor.  Chest:  Breasts: Right: Normal. No swelling or mass.  Left: Normal. No swelling or mass.  Musculoskeletal:  General: Normal range of motion.  Cervical back: Normal range of motion.  Lymphadenopathy:  Upper Body:  Right upper body: No supraclavicular or axillary adenopathy.  Left upper body: No supraclavicular or axillary adenopathy.  Skin: General: Skin is warm.  Neurological:  General: No focal deficit present.  Mental Status: She is alert.  Psychiatric:  Mood and Affect: Mood normal.  Behavior: Behavior normal.    Labs, Imaging and Diagnostic Testing: Left breast mammogram shows a 1.2 cm mass upper outer quadrant core biopsy proven to be DCIS. Receptors pending.  Assessment and Plan:   Diagnoses and all orders for this visit:  Ductal carcinoma in situ (DCIS) of left breast    Discussed treatment options for her DCIS. Discussed breast conserving surgery versus mastectomy with reconstruction. Discussed potential lymph node biopsy depending on final overall pathology since there is some question of microinvasion. Given her advanced age, if her invasive disease is discovered and is low-grade I would not pursue sentinel node mapping in the circumstance more likely. We discussed the pros and cons of that approach. Her sister was on the call as well and I  answered all their questions the best my ability. We discussed long-term expectations, additional treatments, local regional recurrence, overall survival of different surgical options. She is opted to proceed with left breast seed localized lumpectomy. She will need to hold her anticoagulation for 48 hours prior to surgery.

## 2022-03-29 NOTE — Transfer of Care (Signed)
Immediate Anesthesia Transfer of Care Note  Patient: [name]  Procedure(s) Performed: LEFT BREAST LUMPECTOMY WITH RADIOACTIVE SEED LOCALIZATION (Left: Breast)  Patient Location: PACU  Anesthesia Type:General  Level of Consciousness: awake, alert , oriented and patient cooperative  Airway & Oxygen Therapy: Patient Spontanous Breathing  Post-op Assessment: Report given to RN and Post -op Vital signs reviewed and stable  Post vital signs: Reviewed and stable  Last Vitals:  Vitals Value Taken Time  BP    Temp    Pulse    Resp    SpO2      Last Pain:  Vitals:   03/29/22 0631  TempSrc:   PainSc: (P) 0-No pain         Complications: No notable events documented.

## 2022-03-29 NOTE — Interval H&P Note (Signed)
History and Physical Interval Note:  03/29/2022 7:21 AM  [name]  has presented today for surgery, with the diagnosis of LEFT DCIS.  The various methods of treatment have been discussed with the patient and family. After consideration of risks, benefits and other options for treatment, the patient has consented to  Procedure(s): LEFT BREAST LUMPECTOMY WITH RADIOACTIVE SEED LOCALIZATION (Left) as a surgical intervention.  The patient's history has been reviewed, patient examined, no change in status, stable for surgery.  I have reviewed the patient's chart and labs.  Questions were answered to the patient's satisfaction.   The procedure has been discussed with the patient. Alternatives to surgery have been discussed with the patient.  Risks of surgery include bleeding,  Infection,  Seroma formation, death,  and the need for further surgery.   The patient understands and wishes to proceed.   [place]

## 2022-03-29 NOTE — Anesthesia Postprocedure Evaluation (Signed)
Anesthesia Post Note  Patient: [name]  Procedure(s) Performed: LEFT BREAST LUMPECTOMY WITH RADIOACTIVE SEED LOCALIZATION (Left: Breast)     Patient location during evaluation: PACU Anesthesia Type: General Level of consciousness: awake and alert Pain management: pain level controlled Vital Signs Assessment: post-procedure vital signs reviewed and stable Respiratory status: spontaneous breathing, nonlabored ventilation and respiratory function stable Cardiovascular status: blood pressure returned to baseline and stable Postop Assessment: no apparent nausea or vomiting Anesthetic complications: no   No notable events documented.  Last Vitals:  Vitals:   03/29/22 0602 03/29/22 0833  BP: (!) 157/82 (!) 148/97  Pulse: 65 77  Resp: 17 20  Temp: 36.4 C 36.7 C  SpO2: 100% 96%    Last Pain:  Vitals:   03/29/22 0833  TempSrc:   PainSc: 0-No pain                 Lidia Collum

## 2022-03-29 NOTE — Anesthesia Procedure Notes (Signed)
Procedure Name: LMA Insertion Date/Time: 03/29/2022 7:38 AM  Performed by: Sammie Bench, CRNAPre-anesthesia Checklist: Patient identified, Emergency Drugs available, Suction available and Patient being monitored Patient Re-evaluated:Patient Re-evaluated prior to induction Oxygen Delivery Method: Circle System Utilized Preoxygenation: Pre-oxygenation with 100% oxygen Induction Type: IV induction Ventilation: Mask ventilation without difficulty LMA: LMA inserted LMA Size: 4.0 Number of attempts: 1 Airway Equipment and Method: Bite block Placement Confirmation: positive ETCO2 Tube secured with: Tape Dental Injury: Teeth and Oropharynx as per pre-operative assessment

## 2022-03-30 ENCOUNTER — Encounter (HOSPITAL_COMMUNITY): Payer: Self-pay | Admitting: Surgery

## 2022-04-02 DIAGNOSIS — J4550 Severe persistent asthma, uncomplicated: Secondary | ICD-10-CM

## 2022-04-03 ENCOUNTER — Encounter: Payer: Self-pay | Admitting: Surgery

## 2022-04-03 ENCOUNTER — Ambulatory Visit (INDEPENDENT_AMBULATORY_CARE_PROVIDER_SITE_OTHER): Payer: Medicare Other

## 2022-04-03 DIAGNOSIS — J4550 Severe persistent asthma, uncomplicated: Secondary | ICD-10-CM

## 2022-04-04 ENCOUNTER — Telehealth: Payer: Self-pay | Admitting: *Deleted

## 2022-04-04 NOTE — Telephone Encounter (Signed)
Received fax from [facility] requesting PA for Glyxambi 10-5 mg.  PA completed on CoverMyMeds and approved from 03/05/2022 through 03/30/2023.  [facility] notified of approval via fax.

## 2022-04-04 NOTE — Patient Instructions (Signed)
Visit Information  Phone number for Pharmacist: [phone]   Goals Addressed   None     Care Plan : [place]  Updates made by Charlton Haws, RPH since 04/04/2022 12:00 AM     Problem: Hypertension, Hyperlipidemia, Diabetes, Atrial Fibrillation, Heart Failure, Asthma, Osteoarthritis, and Overactive Bladder      Long-Range Goal: Disease Management   Start Date: 12/30/2020  Expected End Date: 04/05/2023  This Visit's Progress: On track  Priority: High  Note:   Current Barriers:  None identified  Pharmacist Clinical Goal(s):  Patient will contact provider office for questions/concerns as evidenced notation of same in electronic health record through collaboration with PharmD and provider.   Interventions: 1:1 collaboration with [name], MD regarding development and update of comprehensive plan of care as evidenced by provider attestation and co-signature Inter-disciplinary care team collaboration (see longitudinal plan of care) Comprehensive medication review performed; medication list updated in electronic medical record  Heart Failure / Hypertension (Goal: BP < 140/90) -Controlled - pt has not needed furosemide, so she is not taking potassium either -Current home BP/HR readings: n/a -Last ejection fraction: 60-65% (Date: 10/2020) - per Dr Sallyanne Kuster she does not have significant LV diastolic dysfunction, issues related to OSA and Afib. -HF type: Diastolic; NYHA Class: n/a -Current treatment: Irbesartan 150 mg daily - Appropriate, Effective, Safe, Accessible Furosemide 20 mg daily - not taking Potassium - not taking Empagliflozin 10 mg (part of Glyxambi) - Appropriate, Effective, Safe, Accessible -Medications previously tried: amlodipine, diltiazem, valsartan  -Educated on Benefits of medications for managing symptoms and prolonging life Importance of weighing daily; if you gain more than 3 pounds in one day or 5 pounds in one week, contact  provider -Recommended to continue current medication  Atrial Fibrillation (Goal: prevent stroke and major bleeding) -Controlled - managed by cardiology -Hx DVT as well -Current treatment: Xarelto 15 mg daily PM - Appropriate, Effective, Safe, Accessible -Medications previously tried: none reported -Xarelto - encouraged to take same time every day with largest meal. She is taking in morning at various times (8A-12P). Denies aspirin, aleve, or other NSAID use. The only thing she takes for pain is Tylenol.  -Counseled on importance of adherence to anticoagulant exactly as prescribed; bleeding risk associated with Xarelto and importance of self-monitoring for signs/symptoms of bleeding; avoidance of NSAIDs due to increased bleeding risk with anticoagulants; -Recommended to continue current medication  Hyperlipidemia: (LDL goal < 100) -Controlled - LDL 49 (04/2021) at goal -Current treatment: Atorvastatin 10 mg daily - Appropriate, Effective, Safe, Accessible CoQ10 - Appropriate, Effective, Safe, Accessible -Medications previously tried: n/a  -Educated on Cholesterol goals;  -Recommended to continue current medication  Diabetes (A1c goal <7%) -Controlled - A1c 5.6% (03/2022) at goal in normal range -Current home glucose readings: typically does not check BG, but has been checking with surgery coming up this (breast cancer) fasting glucose: 84 -Denies hypoglycemic/hyperglycemic symptoms -Current medications: Glyxambi (empagliflozin-linagliptin) 10-5 mg daily - Query Appropriate -Medications previously tried: metformin  -Educated on A1c and blood sugar goals - A1c is in normal range, she may not need combo therapy much longer; would consider dropping Tradjenta component of Glyxambi if A1c remains < 6% at follow up, would continue Jardiance for HF benefit regardless of A1c  Asthma (Goal: control symptoms and prevent exacerbations) -Controlled -Follows with allergy/asthma (Dr Neldon Mc) - per  MD (03/21/22) she used Advair + Spiriva consistently and adds on albuterol and Flovent during increased asthma activity -Pulmonary function testing: not on file -  Exacerbations requiring treatment in last 6 months: 0 -Current treatment  Albuterol HFA PRN - Appropriate, Effective, Safe, Accessible Advair 230-21 mcg  HFA 2 puffs BID -Appropriate, Effective, Safe, Accessible Spiriva Respimat 1.25 mcg 2 puff daily -Appropriate, Effective, Safe, Accessible Flovent 220 mcg HFA 2 puffs BID (PRN) -Appropriate, Effective, Safe, Accessible Levalbuterol neb PRN -Appropriate, Effective, Safe, Accessible Montelukast 10 mg daily -Appropriate, Effective, Safe, Accessible Famotidine 40 mg daily -Appropriate, Effective, Safe, Accessible Ipratropium 0.06% nasal spray -Appropriate, Effective, Safe, Accessible Hydroxyzine 10 mg PRN itching -Appropriate, Effective, Safe, Accessible Xolair injections -Appropriate, Effective, Safe, Accessible Epi-Pen PRN -Medications previously tried: n/a  -Patient reports consistent use of maintenance inhaler -Counseled on Differences between maintenance and rescue inhalers -Recommended to continue current medication  OAB / Urinary incontinence (Goal: manage symptoms) -Controlled - per pt report; it helps that she is not needing furosemide right now -Follows with Dr Matilde Sprang -Current treatment  Oxbutynin ER 10 mg daily -Appropriate, Effective, Safe, Accessible Tolterodine ER 4 mg daily -Appropriate, Effective, Safe, Accessible -Medications previously tried: Myrbetriq, fesoterodine, solifenacin -Recommended to continue current medication  Chronic constipation (Goal: regular BM) -Controlled - per pt report she is having 1 BM daily right now; she is not using Trulance regularly -Current treatment  Miralax daily - Appropriate, Effective, Safe, Accessible Trulance 3 mg PRN -Appropriate, Effective, Safe, Accessible Metamucil PRN - Appropriate, Effective, Safe,  Accessible -Medications previously tried: n/a -Recommended to continue current medication  Osteoarthritis (Goal: Reduce pain) -Not ideally controlled- per pt report -s/p knee surgery 09/2021; she uses ice and heat as well -Current treatment  Tylenol 650 mg - 2 tab PRN - Appropriate, Effective, Safe, Accessible -Medications previously tried: multiple  -Recommend to continue current medication  Breast cancer (DCIS) -Dx 02/23/2022; resection scheduled this week; advised continued follow up with oncology as scheduled  Patient Goals/Self-Care Activities Patient will:  - take medications as prescribed as evidenced by patient report and record review focus on medication adherence by routine       Patient verbalizes understanding of instructions and care plan provided today and agrees to view in [place]. Active MyChart status and patient understanding of how to access instructions and care plan via MyChart confirmed with patient.    Telephone follow up appointment with pharmacy team member scheduled for: 6 months  Charlene Brooke, PharmD, [facility] Clinical Pharmacist [facility] [phone]

## 2022-04-05 ENCOUNTER — Encounter: Payer: Self-pay | Admitting: *Deleted

## 2022-04-05 LAB — SURGICAL PATHOLOGY

## 2022-04-06 ENCOUNTER — Encounter: Payer: Self-pay | Admitting: Surgery

## 2022-04-10 ENCOUNTER — Other Ambulatory Visit: Payer: Self-pay

## 2022-04-10 ENCOUNTER — Encounter: Payer: Self-pay | Admitting: Hematology and Oncology

## 2022-04-10 ENCOUNTER — Inpatient Hospital Stay: Payer: Medicare Other | Attending: Hematology and Oncology | Admitting: Hematology and Oncology

## 2022-04-10 VITALS — BP 138/70 | HR 78 | Temp 98.1°F | Resp 16 | Ht 64.0 in | Wt 202.0 lb

## 2022-04-10 DIAGNOSIS — M818 Other osteoporosis without current pathological fracture: Secondary | ICD-10-CM

## 2022-04-10 DIAGNOSIS — D0512 Intraductal carcinoma in situ of left breast: Secondary | ICD-10-CM | POA: Insufficient documentation

## 2022-04-10 DIAGNOSIS — I10 Essential (primary) hypertension: Secondary | ICD-10-CM | POA: Diagnosis not present

## 2022-04-10 DIAGNOSIS — Z7901 Long term (current) use of anticoagulants: Secondary | ICD-10-CM | POA: Insufficient documentation

## 2022-04-10 DIAGNOSIS — E119 Type 2 diabetes mellitus without complications: Secondary | ICD-10-CM | POA: Insufficient documentation

## 2022-04-10 DIAGNOSIS — Z170 Estrogen receptor positive status [ER+]: Secondary | ICD-10-CM | POA: Diagnosis not present

## 2022-04-10 DIAGNOSIS — R011 Cardiac murmur, unspecified: Secondary | ICD-10-CM | POA: Diagnosis not present

## 2022-04-10 DIAGNOSIS — Z79899 Other long term (current) drug therapy: Secondary | ICD-10-CM | POA: Diagnosis not present

## 2022-04-10 DIAGNOSIS — Z800 Family history of malignant neoplasm of digestive organs: Secondary | ICD-10-CM | POA: Insufficient documentation

## 2022-04-10 DIAGNOSIS — I272 Pulmonary hypertension, unspecified: Secondary | ICD-10-CM | POA: Diagnosis not present

## 2022-04-10 DIAGNOSIS — Z7984 Long term (current) use of oral hypoglycemic drugs: Secondary | ICD-10-CM | POA: Insufficient documentation

## 2022-04-10 DIAGNOSIS — Z803 Family history of malignant neoplasm of breast: Secondary | ICD-10-CM | POA: Insufficient documentation

## 2022-04-10 DIAGNOSIS — Z7951 Long term (current) use of inhaled steroids: Secondary | ICD-10-CM | POA: Diagnosis not present

## 2022-04-10 DIAGNOSIS — Z86718 Personal history of other venous thrombosis and embolism: Secondary | ICD-10-CM | POA: Insufficient documentation

## 2022-04-10 NOTE — Assessment & Plan Note (Signed)
This is a very pleasant 77 year old postmenopausal female patient with left breast DCIS, intermediate grade, status post lumpectomy, final prognostic showed ER 80% positive weak staining, PR negative DCIS with negative margins now here with medical oncology review recommendations about antiestrogen therapy.  We have discussed that she will benefit from adjuvant radiation since she may not have great benefit from addition of antiestrogen therapy.  We will still attempt antiestrogen therapy, preferably aromatase inhibitors and if she does not tolerate it well or if she has any adverse effects, we may discontinue it.  I once again discussed about mechanism of action of aromatase inhibitors, adverse effects including but not limited to hot flashes, vaginal dryness, arthralgias, bone loss, questionable increased risk of cardiovascular events etc. She will return to clinic after completion of adjuvant radiation to initiate antiestrogen therapy.

## 2022-04-10 NOTE — Progress Notes (Signed)
[facility] NOTE  Patient Care Team: [name], MD as PCP - General (Family Medicine) Croitoru, Dani Gobble, MD as PCP - Cardiology (Cardiology) Hortencia Pilar, MD as Consulting Physician (Ophthalmology) Neldon Mc, Donnamarie Poag, MD as Consulting Physician (Allergy and Immunology) Anda Kraft, MD as Consulting Physician (Endocrinology) Inocencio Homes, DPM as Consulting Physician (Podiatry) Croitoru, Dani Gobble, MD as Consulting Physician (Cardiology) Druscilla Brownie, MD as Consulting Physician (Dermatology) Boyd Kerbs, MD as Referring Physician (Specialist) Dionisio David, [place] as Referring Physician (Chiropractic Medicine) Ronald Lobo, MD as Consulting Physician (Gastroenterology) Hallows, Verlee Monte, MD (Orthopedic Surgery) Dohmeier, Asencion Partridge, MD as Consulting Physician (Neurology) Rockwell Germany, RN as Oncology Nurse Navigator Mauro Kaufmann, RN as Oncology Nurse Navigator Erroll Luna, MD as Consulting Physician (General Surgery) [name], MD as Consulting Physician (Hematology and Oncology) Gery Pray, MD as Consulting Physician (Radiation Oncology) Charlton Haws, [facility] as Pharmacist (Pharmacist)  CHIEF COMPLAINTS/PURPOSE OF CONSULTATION:  Newly diagnosed breast cancer  HISTORY OF PRESENTING ILLNESS:  [name] 76 y.o. female is here because of recent diagnosis of left breast cancer  I reviewed her records extensively and collaborated the history with the patient.  SUMMARY OF ONCOLOGIC HISTORY: Oncology History  Ductal carcinoma in situ (DCIS) of left breast  02/02/2022 Mammogram   Screening mammogram showed asymmetry in the left breast which is indeterminate.  Diagnostic mammogram and ultrasound was recommended.  Unilateral diagnostic mammogram showed a stable 1.1 cm oval mass in the left breast upper outer aspect middle depth, 6.5 cm from the nipple.  No other significant masses or calcifications were seen. [place] recommended. Stable  1.1 cm oval mass in the left breast upper outer aspect middle depth has been stable since at least 2018 and is consistent with a fibroadenoma and is benign.   02/19/2022 Pathology Results   Pathology from left breast needle core biopsy showed DCIS, intermediate grade, cannot rule focal microinvasion, no evidence of necrosis or calcifications, estrogen and progesterone unsuccessful on the biopsy specimen due to the neoplasm not present at the deeper levels   02/23/2022 Initial Diagnosis   Ductal carcinoma in situ (DCIS) of left breast    Genetic Testing   [facility] CustomNext Panel was Negative. Report date is 03/13/2022.  The CustomNext-Cancer+RNAinsight panel offered by [facility] includes sequencing and rearrangement analysis for the following 47 genes:  APC, ATM, AXIN2, BARD1, BMPR1A, BRCA1, BRCA2, BRIP1, CDH1, CDK4, CDKN2A, CHEK2, CTNNA1, DICER1, EPCAM, GREM1, HOXB13, KIT, MEN1, MLH1, MSH2, MSH3, MSH6, MUTYH, NBN, NF1, NTHL1, PALB2, PDGFRA, PMS2, POLD1, POLE, PTEN, RAD50, RAD51C, RAD51D, SDHA, SDHB, SDHC, SDHD, SMAD4, SMARCA4, STK11, TP53, TSC1, TSC2, and VHL.  RNA data is routinely analyzed for use in variant interpretation for all genes.   03/29/2022 Pathology Results   DCIS, intermediate grade Tis. Negative for invasive carcinoma. Margins negative Prognostics ER positive 80%, weak staining, PR Negative     She is here for a follow up by herself.  She tried to call her sister twice because she cant remember things, but there is no answer. She is otherwise healing well from her surgery. Rest of the pertinent 10 point ROS reviewed and negative  MEDICAL HISTORY:  Past Medical History:  Diagnosis Date   Allergic rhinitis    Allergy    Arthritis    Asthma    Breast cancer ([place])    Chronic headache    Diabetes mellitus    Ductal carcinoma in situ (DCIS) of left breast 02/23/2022   DVT (deep venous thrombosis) (  [place])    Dyspnea    Heart murmur    Hypertension    Penicillin allergy  01/19/2020   Pulmonary embolism (HCC)    Pulmonary hypertension (HCC)    Seizures ([place])     SURGICAL HISTORY: Past Surgical History:  Procedure Laterality Date   ABDOMINAL HYSTERECTOMY     partial, has ovaries   BREAST LUMPECTOMY WITH RADIOACTIVE SEED LOCALIZATION Left 03/29/2022   Procedure: LEFT BREAST LUMPECTOMY WITH RADIOACTIVE SEED LOCALIZATION;  Surgeon: Erroll Luna, MD;  Location: [place];  Service: General;  Laterality: Left;   CARDIAC CATHETERIZATION  12/28/2010   Mod. pulmonary hypertension, normal coronary arteries   CARDIOVERSION N/A 11/23/2020   Procedure: CARDIOVERSION;  Surgeon: Sanda Klein, MD;  Location: [facility];  Service: Cardiovascular;  Laterality: N/A;   CHOLECYSTECTOMY N/A 12/20/2021   Procedure: LAPAROSCOPIC CHOLECYSTECTOMY WITH INTRAOPERATIVE CHOLANGIOGRAM;  Surgeon: Armandina Gemma, MD;  Location: [facility];  Service: General;  Laterality: N/A;   DOPPLER ECHOCARDIOGRAPHY  10/08/2011   EF=>55%,mild asymmetric LVH, mod. TR, mod. PH, mild to mod LA dilatation   IR LUMBAR Alpha W/IMG GUIDE  01/12/2020   KNEE ARTHROSCOPY Left    KNEE SURGERY     Nuclear Stress Test  05/20/2006   No ischemia   PARTIAL HYSTERECTOMY     PLANTAR FASCIA SURGERY     TONSILLECTOMY      SOCIAL HISTORY: Social History   Socioeconomic History   Marital status: Widowed    Spouse name: Not on file   Number of children: Y   Years of education: Not on file   Highest education level: Not on file  Occupational History   Occupation: retired Geologist, engineering.   Tobacco Use   Smoking status: Never   Smokeless tobacco: Never  Vaping Use   Vaping Use: Never used  Substance and Sexual Activity   Alcohol use: Not Currently    Alcohol/week: 0.0 standard drinks of alcohol    Comment: occ glass on wine   Drug use: No   Sexual activity: Never  Other Topics Concern   Not on file  Social History Narrative   Widow    limited exercise.   Social Determinants of Health   Financial  Resource Strain: Medium Risk (02/28/2022)   Overall Financial Resource Strain (CARDIA)    Difficulty of Paying Living Expenses: Somewhat hard  Food Insecurity: No Food Insecurity (02/28/2022)   Hunger Vital Sign    Worried About Running Out of Food in the Last Year: Never true    Ran Out of Food in the Last Year: Never true  Transportation Needs: No Transportation Needs (02/28/2022)   PRAPARE - Hydrologist (Medical): No    Lack of Transportation (Non-Medical): No  Physical Activity: Not on file  Stress: Not on file  Social Connections: Not on file  Intimate Partner Violence: Not on file    FAMILY HISTORY: Family History  Problem Relation Age of Onset   Hypertension Mother    Clotting disorder Mother    Breast cancer Mother 41   Arthritis Mother    Stroke Mother    Diabetes Mother    Colon cancer Mother 57 - 61   Alcohol abuse Father    Breast cancer Half-Sister 46       recurrence at 81, reports positive genetic testing   Arthritis Half-Sister    Diabetes Half-Sister    Colon cancer Half-Sister 48   Multiple sclerosis Sister    Prostate cancer Half-Brother  Stomach cancer Half-Brother    Allergies Other        grandson   Allergic rhinitis Neg Hx    Angioedema Neg Hx    Asthma Neg Hx    Atopy Neg Hx    Eczema Neg Hx    Immunodeficiency Neg Hx    Urticaria Neg Hx     ALLERGIES:  is allergic to dilantin [phenytoin], latex, oxycodone, penicillins, codeine, hydrocodone, nickel, and pregabalin.  MEDICATIONS:  Current Outpatient Medications  Medication Sig Dispense Refill   acetaminophen (TYLENOL) 500 MG tablet Take 500-1,000 mg by mouth every 6 (six) hours as needed for moderate pain.     albuterol (VENTOLIN HFA) 108 (90 Base) MCG/ACT inhaler INHALE 2 PUFFS BY MOUTH EVERY 6 HOURS AS NEEDED FOR WHEEZING OR SHORTNESS OF BREATH 18 g 1   Artificial Saliva (BIOTENE DRY MOUTH) LOZG Use as directed 1 lozenge in the mouth or throat daily as needed  (dry mouth).     atorvastatin (LIPITOR) 10 MG tablet TAKE 1 TABLET(10 MG) BY MOUTH DAILY 90 tablet 3   Blood Glucose Monitoring Suppl (ONETOUCH VERIO) w/Device KIT Use to check blood sugar up to 2 times a day 1 kit 0   Coenzyme Q10 (COQ10) 100 MG CAPS Take 100 mg by mouth daily.     cyclobenzaprine (FLEXERIL) 10 MG tablet Take 0.5-1 tablets (5-10 mg total) by mouth at bedtime as needed for muscle spasms. 20 tablet 0   Empagliflozin-linaGLIPtin (GLYXAMBI) 10-5 MG TABS Take 1 tablet by mouth daily. 30 tablet 5   EPINEPHrine 0.3 mg/0.3 mL IJ SOAJ injection USE AS DIRECTED FOR LIFE THREATENING ALLERGIC REACTIONS 2 each 2   estradiol (ESTRACE) 0.1 MG/GM vaginal cream 0.5 g of cream intravaginally administered daily for 2 weeks, then reduce to twice weekly (Patient taking differently: Place 1 Applicatorful vaginally daily as needed (irritation).) 42.5 g 12   famotidine (PEPCID) 40 MG tablet Take 1 (ONE) tablet by mouth every evening. 90 tablet 1   fluticasone (FLOVENT HFA) 220 MCG/ACT inhaler Inhale 2 puffs into the lungs 2 (two) times daily as needed (shortness of breath).     fluticasone-salmeterol (ADVAIR HFA) 230-21 MCG/ACT inhaler Inhale 2 puffs into the lungs 2 (two) times daily.     gabapentin (NEURONTIN) 600 MG tablet TAKE 1 TABLET BY MOUTH EVERY DAY AT BREAKFAST, 1 TABLET AT LUNCH AND 2 TABLETS AT BEDTIME 360 tablet 1   glucose blood (ONETOUCH VERIO) test strip 1 strip 2 (two) times daily     hydrOXYzine (ATARAX/VISTARIL) 10 MG tablet Take 1 tablet (10 mg total) by mouth daily as needed for itching. 30 tablet 2   ipratropium (ATROVENT) 0.06 % nasal spray 1-2 sprays in each nostril 1-2 times per day as needed to dry nose 15 mL 5   irbesartan (AVAPRO) 150 MG tablet Take 1 tablet (150 mg total) by mouth daily. 90 tablet 2   Lactase (DAIRY-RELIEF PO) Take 1 capsule by mouth daily as needed (eating dairy).      Lancets (ONETOUCH DELICA PLUS [id number]) MISC USE TO CHECK BLOOD SUGAR UP TO TWICE DAILY  100 each 5   levalbuterol (XOPENEX) 1.25 MG/3ML nebulizer solution USE 1 VIAL VIA NEBULIZER EVERY 6 HOURS AS NEEDED FOR SHORTNESS OF BREATH OR WHEEZING 900 mL 1   liver oil-zinc oxide (DESITIN) 40 % ointment Apply 1 application topically as needed for irritation.     MAGNESIUM GLYCINATE PO Take 2 tablets by mouth 2 (two) times daily.     Menthol, Topical  Analgesic, (ABSORBINE PLUS JR EX) Apply 1 patch topically daily as needed (pain).     Menthol-Methyl Salicylate (SALONPAS JET SPRAY EX) Apply 1 spray topically daily as needed (knee pain).     METAMUCIL FIBER PO Take 1 Dose by mouth daily. 1 dose = 1/2 tablespoon     methimazole (TAPAZOLE) 5 MG tablet Take 1 tablet (5 mg total) by mouth daily. 90 tablet 3   montelukast (SINGULAIR) 10 MG tablet Take 1 tablet (10 mg total) by mouth at bedtime. For asthma control. 90 tablet 1   Mouthwashes (BIOTENE DRY MOUTH MT) Use as directed 1 Dose in the mouth or throat daily as needed (dry mouth).     nystatin (MYCOSTATIN) 100000 UNIT/ML suspension 3m swish and swallow after Advair 2 times daily. 473 mL 5   ondansetron (ZOFRAN-ODT) 8 MG disintegrating tablet Take 1 tablet (8 mg total) by mouth every 8 (eight) hours as needed for nausea or vomiting. 20 tablet 0   ONETOUCH VERIO test strip USE TO CHECK BLOOD SUGAR UP TO TWICE DAILY AS DIRECTED 100 strip 5   oxybutynin (DITROPAN-XL) 10 MG 24 hr tablet Take 10 mg by mouth at bedtime.     pantoprazole (PROTONIX) 40 MG tablet Take 1 (ONE) tablet by mouth every morning. 90 tablet 1   Plecanatide (TRULANCE) 3 MG TABS Take 3 mg by mouth daily as needed (constipation).     Polyethyl Glycol-Propyl Glycol (SYSTANE OP) Place 1 drop into both eyes daily as needed (dry eyes).     polyethylene glycol powder (GLYCOLAX/MIRALAX) 17 GM/SCOOP powder Take 1 Container by mouth once.     PRESCRIPTION MEDICATION Inhale into the lungs at bedtime. CPAP     Tiotropium Bromide Monohydrate (SPIRIVA RESPIMAT) 1.25 MCG/ACT AERS Inhale 2 each  into the lungs daily.     tolterodine (DETROL LA) 4 MG 24 hr capsule Take 4 mg by mouth at bedtime.     traMADol (ULTRAM) 50 MG tablet Take 1-2 tablets (50-100 mg total) by mouth every 6 (six) hours as needed for moderate pain or severe pain (50 mg for moderate, 100 mg for severe). 20 tablet 0   traMADol (ULTRAM) 50 MG tablet Take 1 tablet (50 mg total) by mouth every 6 (six) hours as needed. 20 tablet 0   XARELTO 20 MG TABS tablet TAKE 1 TABLET(20 MG) BY MOUTH DAILY WITH SUPPER 90 tablet 1   Current Facility-Administered Medications  Medication Dose Route Frequency Provider Last Rate Last Admin   omalizumab (Arvid Right injection 300 mg  300 mg Subcutaneous Q28 days Kozlow, EDonnamarie Poag MD   300 mg at 04/03/22 1005    REVIEW OF SYSTEMS:   Constitutional: Denies fevers, chills or abnormal night sweats Eyes: Denies blurriness of vision, double vision or watery eyes Ears, nose, mouth, throat, and face: Denies mucositis or sore throat Respiratory: Denies cough, dyspnea or wheezes Cardiovascular: Denies palpitation, chest discomfort or lower extremity swelling Gastrointestinal:  Denies nausea, heartburn or change in bowel habits Skin: Denies abnormal skin rashes Lymphatics: Denies new lymphadenopathy or easy bruising Neurological:Denies numbness, tingling or new weaknesses Behavioral/Psych: Mood is stable, no new changes  Breast: Denies any palpable lumps or discharge All other systems were reviewed with the patient and are negative.  PHYSICAL EXAMINATION: ECOG PERFORMANCE STATUS: 0 - Asymptomatic  Vitals:   04/10/22 1120  BP: 138/70  Pulse: 78  Resp: 16  Temp: 98.1 F (36.7 C)  SpO2: 99%   Filed Weights   04/10/22 1120  Weight: 202 lb (  91.6 kg)   General: Alert, in no distress Left breast healing well,   LABORATORY DATA:  I have reviewed the data as listed Lab Results  Component Value Date   WBC 6.5 02/28/2022   HGB 12.2 02/28/2022   HCT 36.7 02/28/2022   MCV 74.6 (L)  02/28/2022   PLT 253 02/28/2022   Lab Results  Component Value Date   NA 138 02/28/2022   K 4.4 02/28/2022   CL 106 02/28/2022   CO2 29 02/28/2022    RADIOGRAPHIC STUDIES: I have personally reviewed the radiological reports and agreed with the findings in the report.  ASSESSMENT AND PLAN:  Ductal carcinoma in situ (DCIS) of left breast This is a very pleasant 76 year old postmenopausal female patient with left breast DCIS, intermediate grade, status post lumpectomy, final prognostic showed ER 80% positive weak staining, PR negative DCIS with negative margins now here with medical oncology review recommendations about antiestrogen therapy.  We have discussed that she will benefit from adjuvant radiation since she may not have great benefit from addition of antiestrogen therapy.  We will still attempt antiestrogen therapy, preferably aromatase inhibitors and if she does not tolerate it well or if she has any adverse effects, we may discontinue it.  I once again discussed about mechanism of action of aromatase inhibitors, adverse effects including but not limited to hot flashes, vaginal dryness, arthralgias, bone loss, questionable increased risk of cardiovascular events etc. She will return to clinic after completion of adjuvant radiation to initiate antiestrogen therapy.   Total time spent: 20 minutes including history, physical exam, review of records, counseling and coordination of All questions were answered. The patient knows to call the clinic with any problems, questions or concerns.    [name], MD 04/10/22

## 2022-04-12 ENCOUNTER — Telehealth: Payer: Self-pay | Admitting: Allergy and Immunology

## 2022-04-12 MED ORDER — LEVALBUTEROL HCL 1.25 MG/3ML IN NEBU: INHALATION_SOLUTION | RESPIRATORY_TRACT | 3 refills | Status: DC

## 2022-04-12 MED ORDER — LEVALBUTEROL HCL 1.25 MG/3ML IN NEBU: INHALATION_SOLUTION | RESPIRATORY_TRACT | 1 refills | Status: DC

## 2022-04-12 NOTE — Telephone Encounter (Signed)
PA was received for the levalbuterol 920m that was sent in earlier today.  Called the pharmacy to check to make sure that PA is actually required or just the quantity needing to be changed. Call was dropped before an answer was provided. Resending in the prescription with 942mand 3 refills. Hopefully this will resolve the issue.

## 2022-04-12 NOTE — Addendum Note (Signed)
Addended by: Eloy End D on: 04/12/2022 04:25 PM   Modules accepted: Orders

## 2022-04-12 NOTE — Telephone Encounter (Signed)
Pt requesting a refill for xopenex

## 2022-04-12 NOTE — Telephone Encounter (Signed)
Sent in xopenex refill to [facility] in [place]

## 2022-04-14 DIAGNOSIS — M1990 Unspecified osteoarthritis, unspecified site: Secondary | ICD-10-CM

## 2022-04-14 DIAGNOSIS — E1159 Type 2 diabetes mellitus with other circulatory complications: Secondary | ICD-10-CM

## 2022-04-14 DIAGNOSIS — D0512 Intraductal carcinoma in situ of left breast: Secondary | ICD-10-CM

## 2022-04-14 DIAGNOSIS — I10 Essential (primary) hypertension: Secondary | ICD-10-CM

## 2022-04-14 DIAGNOSIS — J45909 Unspecified asthma, uncomplicated: Secondary | ICD-10-CM | POA: Diagnosis not present

## 2022-04-14 DIAGNOSIS — E785 Hyperlipidemia, unspecified: Secondary | ICD-10-CM

## 2022-04-14 DIAGNOSIS — Z7984 Long term (current) use of oral hypoglycemic drugs: Secondary | ICD-10-CM

## 2022-04-14 DIAGNOSIS — I4891 Unspecified atrial fibrillation: Secondary | ICD-10-CM | POA: Diagnosis not present

## 2022-04-16 LAB — HM DIABETES FOOT EXAM

## 2022-04-18 ENCOUNTER — Telehealth: Payer: Self-pay | Admitting: *Deleted

## 2022-04-18 ENCOUNTER — Telehealth: Payer: Self-pay | Admitting: Hematology and Oncology

## 2022-04-18 NOTE — Patient Outreach (Signed)
  Care Coordination   Initial Visit Note   04/18/2022 Name: [name] MRN: [id number] DOB: 12-29-1945  [name] is a 76 y.o. year old female who sees [name], MD for primary care. I spoke with  [name] by phone today.  What matters to the patients health and wellness today?  Being able to talk with someone in between my Dr visits  like managing my hypertension and dx with breast cancer.   Goals Addressed             This Visit's Progress    Develop Plan of Care for Management of Hypertension          SDOH assessments and interventions completed:  Yes     Care Coordination Interventions Activated:  Yes  Care Coordination Interventions:  Yes, provided   Follow up plan: Follow up call scheduled for [name] [phone] 3:00 PM    Encounter Outcome:  Pt. Visit Completed   [facility] [phone]

## 2022-04-18 NOTE — Telephone Encounter (Signed)
Scheduled appointment per 10/4 staff message. Patients 09/2022 with Dr.Iruku was cancelled and patient will see Tennis Must.Gudena in early December. Patient is aware of the changes made to her upcoming appointment.

## 2022-04-20 NOTE — Progress Notes (Signed)
Location of Breast Cancer: Ductal carcinoma in situ (DCIS) of left breast    Histology per Pathology Report:   02/19/2022 Diagnosis Breast, left, needle core biopsy, 1 o'clock 7cmfn - DUCTAL CARCINOMA IN SITU, INTERMEDIATE GRADE - CANNOT RULE FOCAL MICROINVASION - NECROSIS: NOT IDENTIFIED - CALCIFICATIONS: NOT IDENTIFIED - DCIS LENGTH: 0.4 CM  03/29/2022 FINAL MICROSCOPIC DIAGNOSIS:   A. BREAST, LEFT, LUMPECTOMY:  - Ductal carcinoma in situ, intermediate grade, pTis  - Negative for invasive carcinoma  - See oncology table   B. BREAST, LEFT ADDITIONAL ANTERIOR-MEDIAL MARGIN, EXCISION:  - Negative for ductal carcinoma in situ and invasive carcinoma   C. BREAST, LEFT ADDITIONAL SUPERIOR-LATERAL MARGIN, EXCISION:  - Negative for ductal carcinoma in situ and invasive carcinoma   D. BREAST, LEFT ADDITIONAL INFERIOR MARGIN, EXCISION:  - Negative for ductal carcinoma in situ and invasive carcinoma   Receptor Status: ER(80%), PR (0), Her2-neu (), Ki-()  Did patient present with symptoms (if so, please note symptoms) or was this found on screening mammography?: screening mammogram  Past/Anticipated interventions by surgeon, if any: Left breast seed localized lumpectomy on 03/29/2022 by Dr. Brantley Stage  Past/Anticipated interventions by medical oncology, if any: Antiestrogen therapy after radiation.  Lymphedema issues, if any:  no    Pain issues, if any:  yes - has chronic lower back pain  SAFETY ISSUES: Prior radiation? no Pacemaker/ICD? no Possible current pregnancy?no Is the patient on methotrexate? no  Current Complaints / other details:  Patient has her sister on the phone for the appointment.  BP 135/68 (BP Location: Right Arm, Patient Position: Sitting)   Pulse 72   Temp (!) 96.7 F (35.9 C) (Temporal)   Resp 18   Ht '5\' 4"'  (1.626 m)   Wt 190 lb (86.2 kg)   SpO2 100%   BMI 32.61 kg/m      Jacqulyn Liner, RN 04/20/2022,9:30 AM

## 2022-04-23 ENCOUNTER — Ambulatory Visit: Payer: Medicare Other

## 2022-04-24 ENCOUNTER — Other Ambulatory Visit (INDEPENDENT_AMBULATORY_CARE_PROVIDER_SITE_OTHER): Payer: Medicare Other

## 2022-04-24 ENCOUNTER — Telehealth: Payer: Self-pay | Admitting: Family Medicine

## 2022-04-24 ENCOUNTER — Encounter (HOSPITAL_COMMUNITY): Payer: Self-pay

## 2022-04-24 DIAGNOSIS — E1140 Type 2 diabetes mellitus with diabetic neuropathy, unspecified: Secondary | ICD-10-CM

## 2022-04-24 LAB — LIPID PANEL
Cholesterol: 106 mg/dL (ref 0–200)
HDL: 56.4 mg/dL (ref >39.00)
LDL Cholesterol: 40 mg/dL (ref 0–99)
NonHDL: 49.83
Total CHOL/HDL Ratio: 2
Triglycerides: 49 mg/dL (ref 0.0–149.0)
VLDL: 9.8 mg/dL (ref 0.0–40.0)

## 2022-04-24 LAB — COMPREHENSIVE METABOLIC PANEL
ALT: 9 U/L (ref 0–35)
AST: 17 U/L (ref 0–37)
Albumin: 4 g/dL (ref 3.5–5.2)
Alkaline Phosphatase: 88 U/L (ref 39–117)
BUN: 13 mg/dL (ref 6–23)
CO2: 26 mEq/L (ref 19–32)
Calcium: 9.3 mg/dL (ref 8.4–10.5)
Chloride: 106 mEq/L (ref 96–112)
Creatinine, Ser: 1.04 mg/dL (ref 0.40–1.20)
GFR: 52.49 mL/min — ABNORMAL LOW (ref >60.00)
Glucose, Bld: 91 mg/dL (ref 70–99)
Potassium: 4.2 mEq/L (ref 3.5–5.1)
Sodium: 140 mEq/L (ref 135–145)
Total Bilirubin: 0.8 mg/dL (ref 0.2–1.2)
Total Protein: 7.1 g/dL (ref 6.0–8.3)

## 2022-04-24 NOTE — Progress Notes (Signed)
No critical labs need to be addressed urgently. We will discuss labs in detail at upcoming office visit.   

## 2022-04-24 NOTE — Telephone Encounter (Signed)
-----   Message from Ellamae Sia sent at 04/12/2022 12:35 PM EDT ----- Regarding: Lab orders for Tuesday, 10.10.23 Patient is scheduled for CPX labs, please order future labs, Thanks , Karna Christmas

## 2022-04-24 NOTE — Progress Notes (Signed)
Radiation Oncology         [phone] ________________________________  Name: [name] MRN: [id number]  Date: 04/25/2022  DOB: 04/21/1946  Re-Evaluation Note  CC: [name], MD  [name], MD  No diagnosis found.  Diagnosis:  S/p lumpectomy: Stage 0 (cTis (DCIS), cN0, cM0) Left Breast Intermediate grade ductal carcinoma in-situ, ER+ / PR- / Her2 not assessed  Narrative:  The patient returns today to discuss radiation treatment options. She was seen in the multidisciplinary breast clinic on 02/28/22.   Since consultation, she underwent genetic testing on 02/28/22. Results showed no clinically significant variants detected by +RNAinsight testing.  She opted to proceed with left breast lumpectomy without nodal biopsies on 03/29/22 under the care of Dr. Brantley Stage. Pathology from the procedure revealed: intermediate grade DCIS measuring 1.0 cm. All margins negative for DCIS. Prognostic indicators significant for: estrogen receptor 80% positive with weak staining intensity; progesterone receptor negative; Her2 not assessed.   The patient has met with Dr. Chryl Heck and will return to her following XRT to initiate antiestrogen therapy/AI.    On review of systems, the patient reports ***. She denies *** and any other symptoms.    Allergies:  is allergic to dilantin [phenytoin], latex, oxycodone, penicillins, codeine, hydrocodone, nickel, and pregabalin.  Meds: Current Outpatient Medications  Medication Sig Dispense Refill   acetaminophen (TYLENOL) 500 MG tablet Take 500-1,000 mg by mouth every 6 (six) hours as needed for moderate pain.     albuterol (VENTOLIN HFA) 108 (90 Base) MCG/ACT inhaler INHALE 2 PUFFS BY MOUTH EVERY 6 HOURS AS NEEDED FOR WHEEZING OR SHORTNESS OF BREATH 18 g 1   Artificial Saliva (BIOTENE DRY MOUTH) LOZG Use as directed 1 lozenge in the mouth or throat daily as needed (dry mouth).     atorvastatin (LIPITOR) 10 MG tablet TAKE 1 TABLET(10 MG) BY MOUTH  DAILY 90 tablet 3   Blood Glucose Monitoring Suppl (ONETOUCH VERIO) w/Device KIT Use to check blood sugar up to 2 times a day 1 kit 0   Coenzyme Q10 (COQ10) 100 MG CAPS Take 100 mg by mouth daily.     cyclobenzaprine (FLEXERIL) 10 MG tablet Take 0.5-1 tablets (5-10 mg total) by mouth at bedtime as needed for muscle spasms. 20 tablet 0   Empagliflozin-linaGLIPtin (GLYXAMBI) 10-5 MG TABS Take 1 tablet by mouth daily. 30 tablet 5   EPINEPHrine 0.3 mg/0.3 mL IJ SOAJ injection USE AS DIRECTED FOR LIFE THREATENING ALLERGIC REACTIONS 2 each 2   estradiol (ESTRACE) 0.1 MG/GM vaginal cream 0.5 g of cream intravaginally administered daily for 2 weeks, then reduce to twice weekly (Patient taking differently: Place 1 Applicatorful vaginally daily as needed (irritation).) 42.5 g 12   famotidine (PEPCID) 40 MG tablet Take 1 (ONE) tablet by mouth every evening. 90 tablet 1   fluticasone (FLOVENT HFA) 220 MCG/ACT inhaler Inhale 2 puffs into the lungs 2 (two) times daily as needed (shortness of breath).     fluticasone-salmeterol (ADVAIR HFA) 230-21 MCG/ACT inhaler Inhale 2 puffs into the lungs 2 (two) times daily.     gabapentin (NEURONTIN) 600 MG tablet TAKE 1 TABLET BY MOUTH EVERY DAY AT BREAKFAST, 1 TABLET AT LUNCH AND 2 TABLETS AT BEDTIME 360 tablet 1   glucose blood (ONETOUCH VERIO) test strip 1 strip 2 (two) times daily     hydrOXYzine (ATARAX/VISTARIL) 10 MG tablet Take 1 tablet (10 mg total) by mouth daily as needed for itching. 30 tablet 2   ipratropium (ATROVENT) 0.06 % nasal  spray 1-2 sprays in each nostril 1-2 times per day as needed to dry nose 15 mL 5   irbesartan (AVAPRO) 150 MG tablet Take 1 tablet (150 mg total) by mouth daily. 90 tablet 2   Lactase (DAIRY-RELIEF PO) Take 1 capsule by mouth daily as needed (eating dairy).      Lancets (ONETOUCH DELICA PLUS [id number]) MISC USE TO CHECK BLOOD SUGAR UP TO TWICE DAILY 100 each 5   levalbuterol (XOPENEX) 1.25 MG/3ML nebulizer solution USE 1 VIAL VIA  NEBULIZER EVERY 6 HOURS AS NEEDED FOR SHORTNESS OF BREATH OR WHEEZING 90 mL 3   liver oil-zinc oxide (DESITIN) 40 % ointment Apply 1 application topically as needed for irritation.     MAGNESIUM GLYCINATE PO Take 2 tablets by mouth 2 (two) times daily.     Menthol, Topical Analgesic, (ABSORBINE PLUS JR EX) Apply 1 patch topically daily as needed (pain).     Menthol-Methyl Salicylate (SALONPAS JET SPRAY EX) Apply 1 spray topically daily as needed (knee pain).     METAMUCIL FIBER PO Take 1 Dose by mouth daily. 1 dose = 1/2 tablespoon     methimazole (TAPAZOLE) 5 MG tablet Take 1 tablet (5 mg total) by mouth daily. 90 tablet 3   montelukast (SINGULAIR) 10 MG tablet Take 1 tablet (10 mg total) by mouth at bedtime. For asthma control. 90 tablet 1   Mouthwashes (BIOTENE DRY MOUTH MT) Use as directed 1 Dose in the mouth or throat daily as needed (dry mouth).     nystatin (MYCOSTATIN) 100000 UNIT/ML suspension 70m swish and swallow after Advair 2 times daily. 473 mL 5   ondansetron (ZOFRAN-ODT) 8 MG disintegrating tablet Take 1 tablet (8 mg total) by mouth every 8 (eight) hours as needed for nausea or vomiting. 20 tablet 0   ONETOUCH VERIO test strip USE TO CHECK BLOOD SUGAR UP TO TWICE DAILY AS DIRECTED 100 strip 5   oxybutynin (DITROPAN-XL) 10 MG 24 hr tablet Take 10 mg by mouth at bedtime.     pantoprazole (PROTONIX) 40 MG tablet Take 1 (ONE) tablet by mouth every morning. 90 tablet 1   Plecanatide (TRULANCE) 3 MG TABS Take 3 mg by mouth daily as needed (constipation).     Polyethyl Glycol-Propyl Glycol (SYSTANE OP) Place 1 drop into both eyes daily as needed (dry eyes).     polyethylene glycol powder (GLYCOLAX/MIRALAX) 17 GM/SCOOP powder Take 1 Container by mouth once.     PRESCRIPTION MEDICATION Inhale into the lungs at bedtime. CPAP     Tiotropium Bromide Monohydrate (SPIRIVA RESPIMAT) 1.25 MCG/ACT AERS Inhale 2 each into the lungs daily.     tolterodine (DETROL LA) 4 MG 24 hr capsule Take 4 mg by  mouth at bedtime.     traMADol (ULTRAM) 50 MG tablet Take 1-2 tablets (50-100 mg total) by mouth every 6 (six) hours as needed for moderate pain or severe pain (50 mg for moderate, 100 mg for severe). 20 tablet 0   traMADol (ULTRAM) 50 MG tablet Take 1 tablet (50 mg total) by mouth every 6 (six) hours as needed. 20 tablet 0   XARELTO 20 MG TABS tablet TAKE 1 TABLET(20 MG) BY MOUTH DAILY WITH SUPPER 90 tablet 1   Current Facility-Administered Medications  Medication Dose Route Frequency Provider Last Rate Last Admin   omalizumab (Arvid Right injection 300 mg  300 mg Subcutaneous Q28 days KJiles Prows MD   300 mg at 04/03/22 1005    Physical Findings: The patient is in  no acute distress. Patient is alert and oriented.  vitals were not taken for this visit.  No significant changes. Lungs are clear to auscultation bilaterally. Heart has regular rate and rhythm. No palpable cervical, supraclavicular, or axillary adenopathy. Abdomen soft, non-tender, normal bowel sounds. Right Breast: no palpable mass, nipple discharge or bleeding. Left Breast: ***  Lab Findings: Lab Results  Component Value Date   WBC 6.5 02/28/2022   HGB 12.2 02/28/2022   HCT 36.7 02/28/2022   MCV 74.6 (L) 02/28/2022   PLT 253 02/28/2022    Radiographic Findings: No results found.  Impression:  S/p lumpectomy: Stage 0 (cTis (DCIS), cN0, cM0) Left Breast Intermediate grade ductal carcinoma in-situ, ER+ / PR- / Her2 not assessed  ***  Plan:  Patient is scheduled for CT simulation {date/later today}. ***  -----------------------------------  Blair Promise, PhD, MD  This document serves as a record of services personally performed by Gery Pray, MD. It was created on his behalf by Roney Mans, a trained medical scribe. The creation of this record is based on the scribe's personal observations and the provider's statements to them. This document has been checked and approved by the attending provider.

## 2022-04-25 ENCOUNTER — Ambulatory Visit
Admission: RE | Admit: 2022-04-25 | Discharge: 2022-04-25 | Disposition: A | Discharge status: Home / Self Care | Payer: Medicare Other | Source: Ambulatory Visit | Attending: Radiation Oncology | Admitting: Radiation Oncology

## 2022-04-25 ENCOUNTER — Encounter: Payer: Self-pay | Admitting: Cardiovascular Disease

## 2022-04-25 ENCOUNTER — Other Ambulatory Visit: Payer: Self-pay

## 2022-04-25 ENCOUNTER — Encounter: Payer: Self-pay | Admitting: Radiation Oncology

## 2022-04-25 ENCOUNTER — Ambulatory Visit (INDEPENDENT_AMBULATORY_CARE_PROVIDER_SITE_OTHER): Payer: Medicare Other | Admitting: Cardiovascular Disease

## 2022-04-25 VITALS — BP 135/70 | HR 52 | Ht 64.0 in | Wt 190.8 lb

## 2022-04-25 VITALS — BP 135/68 | HR 72 | Temp 96.7°F | Resp 18 | Ht 64.0 in | Wt 190.0 lb

## 2022-04-25 DIAGNOSIS — I495 Sick sinus syndrome: Secondary | ICD-10-CM | POA: Diagnosis not present

## 2022-04-25 DIAGNOSIS — Z86711 Personal history of pulmonary embolism: Secondary | ICD-10-CM | POA: Diagnosis not present

## 2022-04-25 DIAGNOSIS — Z510 Encounter for antineoplastic radiation therapy: Secondary | ICD-10-CM | POA: Insufficient documentation

## 2022-04-25 DIAGNOSIS — G4733 Obstructive sleep apnea (adult) (pediatric): Secondary | ICD-10-CM | POA: Diagnosis not present

## 2022-04-25 DIAGNOSIS — Z7901 Long term (current) use of anticoagulants: Secondary | ICD-10-CM | POA: Insufficient documentation

## 2022-04-25 DIAGNOSIS — E0590 Thyrotoxicosis, unspecified without thyrotoxic crisis or storm: Secondary | ICD-10-CM | POA: Insufficient documentation

## 2022-04-25 DIAGNOSIS — E7800 Pure hypercholesterolemia, unspecified: Secondary | ICD-10-CM | POA: Diagnosis not present

## 2022-04-25 DIAGNOSIS — Z170 Estrogen receptor positive status [ER+]: Secondary | ICD-10-CM | POA: Diagnosis not present

## 2022-04-25 DIAGNOSIS — D0512 Intraductal carcinoma in situ of left breast: Secondary | ICD-10-CM | POA: Diagnosis not present

## 2022-04-25 DIAGNOSIS — I10 Essential (primary) hypertension: Secondary | ICD-10-CM | POA: Diagnosis not present

## 2022-04-25 DIAGNOSIS — D6869 Other thrombophilia: Secondary | ICD-10-CM

## 2022-04-25 DIAGNOSIS — Z7951 Long term (current) use of inhaled steroids: Secondary | ICD-10-CM | POA: Diagnosis not present

## 2022-04-25 DIAGNOSIS — J4540 Moderate persistent asthma, uncomplicated: Secondary | ICD-10-CM | POA: Diagnosis not present

## 2022-04-25 DIAGNOSIS — E1140 Type 2 diabetes mellitus with diabetic neuropathy, unspecified: Secondary | ICD-10-CM | POA: Diagnosis not present

## 2022-04-25 DIAGNOSIS — Z79899 Other long term (current) drug therapy: Secondary | ICD-10-CM | POA: Insufficient documentation

## 2022-04-25 DIAGNOSIS — E668 Other obesity: Secondary | ICD-10-CM | POA: Diagnosis not present

## 2022-04-25 DIAGNOSIS — I480 Paroxysmal atrial fibrillation: Secondary | ICD-10-CM

## 2022-04-25 DIAGNOSIS — Z7984 Long term (current) use of oral hypoglycemic drugs: Secondary | ICD-10-CM | POA: Diagnosis not present

## 2022-04-25 DIAGNOSIS — I5032 Chronic diastolic (congestive) heart failure: Secondary | ICD-10-CM

## 2022-04-25 DIAGNOSIS — Z853 Personal history of malignant neoplasm of breast: Secondary | ICD-10-CM

## 2022-04-25 DIAGNOSIS — I2721 Secondary pulmonary arterial hypertension: Secondary | ICD-10-CM

## 2022-04-25 NOTE — Progress Notes (Signed)
Cardiology Office Note    Date:  04/26/2022   ID:  Alicia, Price 10/07/1945, MRN [id number]  PCP:  [name], MD  Cardiologist:   Sanda Klein, MD    Chief Complaint  Patient presents with   Atrial Fibrillation    History of Present Illness:  [name] is a 76 y.o. female with recent atrial flutter s/p DCCV 11/23/2020, paroxysmal atrial fibrillation, remote DVT/PE, moderate pulmonary hypertension, chronic reactive airway disease and degenerative joint disease returning for follow-up.  She has had a difficult year, albeit not due to cardiac problems.  She had elective right knee replacement surgery in March.  In June she had a urgent cholecystectomy for acute cholecystitis.  In September she was diagnosed with left breast cancer (intermediate grade DCIS) and she has undergone lumpectomy, with plan for probable external beam radiation therapy.  She has a consult with Dr. Sondra Come later today.  The multiple health problems have delayed her plans for additional healthy lifestyle changes and weight loss, but she remains in the mildly obese territory, has an excellent hemoglobin A1c at 5.6% and excellent lipid profile (she is due for recheck with her PCP next week.).  She has not been troubled by any palpitations.  Her smart watch does not have ECG capability, but does monitor her heart rate via an optical sensor and has not alerted her about excessively fast or irregular rhythms.  She has not had dizziness or syncope.  She presents today with sinus bradycardia at 50 bpm (not on any medications with negative chronotropic effect) but does not have any complaints of fatigue or weakness or dizziness.  She continues to take rivaroxaban and has not had any problems with bleeding or falls or injuries.  She remains on methimazole for hyperthyroidism and her TSH was 0.64 earlier this year.   Previous coronary angiography showed no evidence of CAD but she did have moderate pulmonary  artery hypertension by right heart catheterization (systolic PAP 50 mmHg).  She has had a variety of noncardiac issues including recurrent issues with diarrhea (she is seeing Dr. Cristina Gong) and continued hyperthyroidism controlled on methimazole (she is seeing Dr. Cruzita Lederer).    She continues to have issues with thoracic spine related pain (Dr. Gladstone Lighter). She has had a previous equivocal workup for hypercoagulable conditions (lupus anticoagulant positive on one of 2 separate assays, protein S activity decreased with normal total protein S level).  She had elective right knee replacement surgery in March 2023, cholecystectomy for acute cholecystitis in June 2023 and left breast lumpectomy for cancer in September 2023.    Past Medical History:  Diagnosis Date   Allergic rhinitis    Allergy    Arthritis    Asthma    Breast cancer ([place])    Chronic headache    Diabetes mellitus    Ductal carcinoma in situ (DCIS) of left breast 02/23/2022   DVT (deep venous thrombosis) (HCC)    Dyspnea    Heart murmur    Hypertension    Penicillin allergy 01/19/2020   Pulmonary embolism ([place])    Pulmonary hypertension ([place])    Seizures ([place])     Past Surgical History:  Procedure Laterality Date   ABDOMINAL HYSTERECTOMY     partial, has ovaries   BREAST LUMPECTOMY WITH RADIOACTIVE SEED LOCALIZATION Left 03/29/2022   Procedure: LEFT BREAST LUMPECTOMY WITH RADIOACTIVE SEED LOCALIZATION;  Surgeon: Erroll Luna, MD;  Location: [place];  Service: General;  Laterality: Left;   CARDIAC CATHETERIZATION  12/28/2010   Mod. pulmonary hypertension, normal coronary arteries   CARDIOVERSION N/A 11/23/2020   Procedure: CARDIOVERSION;  Surgeon: Sanda Klein, MD;  Location: [facility];  Service: Cardiovascular;  Laterality: N/A;   CHOLECYSTECTOMY N/A 12/20/2021   Procedure: LAPAROSCOPIC CHOLECYSTECTOMY WITH INTRAOPERATIVE CHOLANGIOGRAM;  Surgeon: Armandina Gemma, MD;  Location: [facility];  Service: General;  Laterality: N/A;    DOPPLER ECHOCARDIOGRAPHY  10/08/2011   EF=>55%,mild asymmetric LVH, mod. TR, mod. PH, mild to mod LA dilatation   IR LUMBAR DISC ASPIRATION W/IMG GUIDE  01/12/2020   KNEE ARTHROSCOPY Left    KNEE SURGERY     Nuclear Stress Test  05/20/2006   No ischemia   PARTIAL HYSTERECTOMY     PLANTAR FASCIA SURGERY     TONSILLECTOMY      Current Medications: Outpatient Medications Prior to Visit  Medication Sig Dispense Refill   acetaminophen (TYLENOL) 500 MG tablet Take 500-1,000 mg by mouth every 6 (six) hours as needed for moderate pain.     albuterol (VENTOLIN HFA) 108 (90 Base) MCG/ACT inhaler INHALE 2 PUFFS BY MOUTH EVERY 6 HOURS AS NEEDED FOR WHEEZING OR SHORTNESS OF BREATH 18 g 1   Artificial Saliva (BIOTENE DRY MOUTH) LOZG Use as directed 1 lozenge in the mouth or throat daily as needed (dry mouth).     atorvastatin (LIPITOR) 10 MG tablet TAKE 1 TABLET(10 MG) BY MOUTH DAILY 90 tablet 3   Blood Glucose Monitoring Suppl (ONETOUCH VERIO) w/Device KIT Use to check blood sugar up to 2 times a day 1 kit 0   cyclobenzaprine (FLEXERIL) 10 MG tablet Take 0.5-1 tablets (5-10 mg total) by mouth at bedtime as needed for muscle spasms. 20 tablet 0   Empagliflozin-linaGLIPtin (GLYXAMBI) 10-5 MG TABS Take 1 tablet by mouth daily. 30 tablet 5   EPINEPHrine 0.3 mg/0.3 mL IJ SOAJ injection USE AS DIRECTED FOR LIFE THREATENING ALLERGIC REACTIONS 2 each 2   estradiol (ESTRACE) 0.1 MG/GM vaginal cream 0.5 g of cream intravaginally administered daily for 2 weeks, then reduce to twice weekly (Patient taking differently: Place 1 Applicatorful vaginally daily as needed (irritation).) 42.5 g 12   famotidine (PEPCID) 40 MG tablet Take 1 (ONE) tablet by mouth every evening. 90 tablet 1   fluticasone (FLOVENT HFA) 220 MCG/ACT inhaler Inhale 2 puffs into the lungs 2 (two) times daily as needed (shortness of breath).     fluticasone-salmeterol (ADVAIR HFA) 230-21 MCG/ACT inhaler Inhale 2 puffs into the lungs 2 (two) times  daily.     gabapentin (NEURONTIN) 600 MG tablet TAKE 1 TABLET BY MOUTH EVERY DAY AT BREAKFAST, 1 TABLET AT LUNCH AND 2 TABLETS AT BEDTIME 360 tablet 1   glucose blood (ONETOUCH VERIO) test strip 1 strip 2 (two) times daily     hydrOXYzine (ATARAX/VISTARIL) 10 MG tablet Take 1 tablet (10 mg total) by mouth daily as needed for itching. 30 tablet 2   ipratropium (ATROVENT) 0.06 % nasal spray 1-2 sprays in each nostril 1-2 times per day as needed to dry nose 15 mL 5   irbesartan (AVAPRO) 150 MG tablet Take 1 tablet (150 mg total) by mouth daily. 90 tablet 2   Lactase (DAIRY-RELIEF PO) Take 1 capsule by mouth daily as needed (eating dairy).      Lancets (ONETOUCH DELICA PLUS [id number]) MISC USE TO CHECK BLOOD SUGAR UP TO TWICE DAILY 100 each 5   levalbuterol (XOPENEX) 1.25 MG/3ML nebulizer solution USE 1 VIAL VIA NEBULIZER EVERY 6 HOURS AS NEEDED FOR SHORTNESS OF BREATH OR  WHEEZING 90 mL 3   liver oil-zinc oxide (DESITIN) 40 % ointment Apply 1 application topically as needed for irritation.     MAGNESIUM GLYCINATE PO Take 2 tablets by mouth 2 (two) times daily.     Menthol, Topical Analgesic, (ABSORBINE PLUS JR EX) Apply 1 patch topically daily as needed (pain).     METAMUCIL FIBER PO Take 1 Dose by mouth daily. 1 dose = 1/2 tablespoon     methimazole (TAPAZOLE) 5 MG tablet Take 1 tablet (5 mg total) by mouth daily. 90 tablet 3   montelukast (SINGULAIR) 10 MG tablet Take 1 tablet (10 mg total) by mouth at bedtime. For asthma control. 90 tablet 1   Mouthwashes (BIOTENE DRY MOUTH MT) Use as directed 1 Dose in the mouth or throat daily as needed (dry mouth).     nystatin (MYCOSTATIN) 100000 UNIT/ML suspension 61m swish and swallow after Advair 2 times daily. 473 mL 5   ondansetron (ZOFRAN-ODT) 8 MG disintegrating tablet Take 1 tablet (8 mg total) by mouth every 8 (eight) hours as needed for nausea or vomiting. 20 tablet 0   ONETOUCH VERIO test strip USE TO CHECK BLOOD SUGAR UP TO TWICE DAILY AS DIRECTED  100 strip 5   oxybutynin (DITROPAN-XL) 10 MG 24 hr tablet Take 10 mg by mouth at bedtime.     pantoprazole (PROTONIX) 40 MG tablet Take 1 (ONE) tablet by mouth every morning. 90 tablet 1   Polyethyl Glycol-Propyl Glycol (SYSTANE OP) Place 1 drop into both eyes daily as needed (dry eyes).     polyethylene glycol powder (GLYCOLAX/MIRALAX) 17 GM/SCOOP powder Take 1 Container by mouth once.     PRESCRIPTION MEDICATION Inhale into the lungs at bedtime. CPAP     Tiotropium Bromide Monohydrate (SPIRIVA RESPIMAT) 1.25 MCG/ACT AERS Inhale 2 each into the lungs daily.     tolterodine (DETROL LA) 4 MG 24 hr capsule Take 4 mg by mouth at bedtime.     traMADol (ULTRAM) 50 MG tablet Take 1-2 tablets (50-100 mg total) by mouth every 6 (six) hours as needed for moderate pain or severe pain (50 mg for moderate, 100 mg for severe). 20 tablet 0   traMADol (ULTRAM) 50 MG tablet Take 1 tablet (50 mg total) by mouth every 6 (six) hours as needed. 20 tablet 0   XARELTO 20 MG TABS tablet TAKE 1 TABLET(20 MG) BY MOUTH DAILY WITH SUPPER 90 tablet 1   Coenzyme Q10 (COQ10) 100 MG CAPS Take 100 mg by mouth daily. (Patient not taking: Reported on 04/25/2022)     Menthol-Methyl Salicylate (SALONPAS JET SPRAY EX) Apply 1 spray topically daily as needed (knee pain).     Plecanatide (TRULANCE) 3 MG TABS Take 3 mg by mouth daily as needed (constipation). (Patient not taking: Reported on 04/25/2022)     Facility-Administered Medications Prior to Visit  Medication Dose Route Frequency Provider Last Rate Last Admin   omalizumab (Arvid Right injection 300 mg  300 mg Subcutaneous Q28 days Kozlow, EDonnamarie Poag MD   300 mg at 04/03/22 1005     Allergies:   Dilantin [phenytoin], Latex, Oxycodone, Penicillins, Codeine, Hydrocodone, Nickel, and Pregabalin   Social History   Socioeconomic History   Marital status: Widowed    Spouse name: Not on file   Number of children: Y   Years of education: Not on file   Highest education level: Not on  file  Occupational History   Occupation: retired mGeologist, engineering   Tobacco Use   Smoking status:  Never   Smokeless tobacco: Never  Vaping Use   Vaping Use: Never used  Substance and Sexual Activity   Alcohol use: Not Currently    Alcohol/week: 0.0 standard drinks of alcohol    Comment: occ glass on wine   Drug use: No   Sexual activity: Never  Other Topics Concern   Not on file  Social History Narrative   Widow    limited exercise.   Social Determinants of Health   Financial Resource Strain: Medium Risk (02/28/2022)   Overall Financial Resource Strain (CARDIA)    Difficulty of Paying Living Expenses: Somewhat hard  Food Insecurity: No Food Insecurity (02/28/2022)   Hunger Vital Sign    Worried About Running Out of Food in the Last Year: Never true    Ran Out of Food in the Last Year: Never true  Transportation Needs: No Transportation Needs (02/28/2022)   PRAPARE - Hydrologist (Medical): No    Lack of Transportation (Non-Medical): No  Physical Activity: Not on file  Stress: Not on file  Social Connections: Not on file     Family History:  The patient's family history includes Alcohol abuse in her father; Allergies in an other family member; Arthritis in her half-sister and mother; Breast cancer (age of onset: 40) in her half-sister; Breast cancer (age of onset: 73) in her mother; Cancer in her brother, mother, sister, and sister; Clotting disorder in her mother; Colon cancer (age of onset: 3) in her half-sister; Colon cancer (age of onset: 4 - 84) in her mother; Diabetes in her half-sister and mother; Hypertension in her mother; Multiple sclerosis in her sister; Prostate cancer in her half-brother; Stomach cancer in her half-brother; Stroke in her mother.   ROS:   Please see the history of present illness.    ROS All other systems reviewed and are negative.   PHYSICAL EXAM:   VS:  BP 135/70 (BP Location: Left Arm, Patient Position: Sitting)    Pulse (!) 52   Ht _0  (1.626 m)   Wt 190 lb 12.8 oz (86.5 kg)   SpO2 93%   BMI 32.75 kg/m      General: Alert, oriented x3, no distress, mildly obese Head: no evidence of trauma, PERRL, EOMI, no exophtalmos or lid lag, no myxedema, no xanthelasma; normal ears, nose and oropharynx Neck: normal jugular venous pulsations and no hepatojugular reflux; brisk carotid pulses without delay and no carotid bruits Chest: clear to auscultation, no signs of consolidation by percussion or palpation, normal fremitus, symmetrical and full respiratory excursions Cardiovascular: normal position and quality of the apical impulse, regular rhythm, normal first and second heart sounds, no murmurs, rubs or gallops Abdomen: no tenderness or distention, no masses by palpation, no abnormal pulsatility or arterial bruits, normal bowel sounds, no hepatosplenomegaly Extremities: no clubbing, cyanosis or edema; 2+ radial, ulnar and brachial pulses bilaterally; 2+ right femoral, posterior tibial and dorsalis pedis pulses; 2+ left femoral, posterior tibial and dorsalis pedis pulses; no subclavian or femoral bruits Neurological: grossly nonfocal Psych: Normal mood and affect    Wt Readings from Last 3 Encounters:  04/25/22 190 lb (86.2 kg)  04/25/22 190 lb 12.8 oz (86.5 kg)  04/10/22 202 lb (91.6 kg)      Studies/Labs Reviewed:  Echocardiogram 11/07/2020   1. Rhythm appears to be atrial flutter with variable conduction. Left  ventricular ejection fraction, by estimation, is 60 to 65%. The left  ventricle has normal function. The left ventricle has  no regional wall  motion abnormalities. There is moderate  asymmetric left ventricular hypertrophy of the basal-septal segment. Left  ventricular diastolic parameters are indeterminate.   2. Right ventricular systolic function is normal. The right ventricular  size is normal. There is mildly elevated pulmonary artery systolic  pressure. The estimated right  ventricular systolic pressure is 32.1 mmHg.   3. Left atrial size was mildly dilated.   4. Right atrial size was moderately dilated.   5. The mitral valve is normal in structure. Trivial mitral valve  regurgitation. No evidence of mitral stenosis.   6. Tricuspid valve regurgitation is moderate.   7. The aortic valve is tricuspid. Aortic valve regurgitation is not  visualized. No aortic stenosis is present.   8. The inferior vena cava is normal in size with greater than 50%  respiratory variability, suggesting right atrial pressure of 3 mmHg.   EKG:  EKG is not ordered today.  It shows sinus bradycardia at 52 bpm, otherwise a normal tracing.    Recent Labs: 10/31/2021: TSH 0.64 12/23/2021: Magnesium 1.9 02/28/2022: Hemoglobin 12.2; Platelet Count 253 04/24/2022: ALT 9; BUN 13; Creatinine, Ser 1.04; Potassium 4.2; Sodium 140   Lipid Panel    Component Value Date/Time   CHOL 106 04/24/2022 1003   TRIG 49.0 04/24/2022 1003   HDL 56.40 04/24/2022 1003   CHOLHDL 2 04/24/2022 1003   VLDL 9.8 04/24/2022 1003   LDLCALC 40 04/24/2022 1003     ASSESSMENT:    1. Paroxysmal atrial fibrillation (HCC)   2. SSS (sick sinus syndrome) ([place])   3. Chronic diastolic heart failure ([place])   4. Essential hypertension   5. History of pulmonary embolism   6. PAH (pulmonary artery hypertension) ([place])   7. Type 2 diabetes mellitus with diabetic neuropathy, without long-term current use of insulin ([place])   8. Hypercholesterolemia   9. Moderate obesity   10. OSA (obstructive sleep apnea)   11. Acquired thrombophilia ([place])   12. Asthma, moderate persistent   13. Hyperthyroidism   14. History of left breast cancer      PLAN:  In order of problems listed above:  Paroxysmal atrial fibrillation and persistent atrial lutter: In normal rhythm today, quite bradycardic.  She last had detected atrial flutter 4: 1 AV block, slow ventricular response) in May 2022 when she underwent a cardioversion.  She is not  aware of palpitations when she has the flutter.  CHADSVasc 4 (age, gender, HTN, DM, CHF).  She also has a previous history of paroxysmal atrial fibrillation. SSS: She is not on any medications with negative chronotropic effect and appears clinically euthyroid.  The last time she had atrial flutter she had spontaneous 4: 1 AV block suggesting that she has both sinus node dysfunction and AV node disease.  It is highly likely that she will eventually require pacemaker, but this is not imminent.  Since she is going to undergo left chest radiation therapy after her lumpectomy, would prefer to delay pacemaker implantation until after she has completed XRT and has had some chance to heal. CHF: This was only a problem when she had a prolonged episode of persistent atrial flutter and she is currently asymptomatic, not requiring diuretics.  On SGLT2 inhibitor.  She has preserved left ventricular systolic function.  Aware of the need to avoid sodium rich foods and she is monitoring her weight regularly.  Should call us for rapid weight gain. HTN: Controlled.  She might have normal blood pressure even without the irbesartan, but  this probably offer some benefit for renal function protection. Hx DVT/PE: Questionable hypercoagulable state, but she requires lifelong anticoagulation for atrial arrhythmia anyway.   PAH: Denies dyspnea at this time.  Probably multifactorial related to left heart diastolic dysfunction, previous pulm embolism, longstanding obesity.  Mild, with estimated systolic PA pressure of 86VEHM on the most recent echocardiogram, similar to 2016 when it was 46 mmHg.   DM: Excellent control, on empagliflozin. HLP: When last checked her lipid parameters were excellent on the current dose of atorvastatin.  Will have labs next week with PCP. Obesity: Has lost 50-60 pounds from her peak weight a few years ago and is intent on losing more weight, but has been more focused on her multiple surgical problems this  year. Complex sleep apnea: She denies daytime hypersomnolence and is compliant with CPAP (via [facility]). Xarelto: Has not had bleeding complications or any thromboembolic events when the medication was interrupted for her multiple surgeries.  Indicated both for atrial fibrillation and history of venous thromboembolism.  Asthma: Avoid nonselective beta-blockers. On Xolair, Spiriva, albuterol/levalbuterol, montelukast, inhaled steroids/LABA (Advair). Dr. Neldon Mc. Hyperthyroidism: Controlled on chronic methimazole with normal TSH a few months ago.  Clinically euthyroid. Left breast cancer status postlumpectomy 03/29/2022 with plans for XRT   Medication Adjustments/Labs and Tests Ordered: Current medicines are reviewed at length with the patient today.  Concerns regarding medicines are outlined above.  Medication changes, Labs and Tests ordered today are listed in the Patient Instructions below: Patient Instructions  Medication Instructions:  No changes *If you need a refill on your cardiac medications before your next appointment, please call your pharmacy*   Lab Work: None ordered If you have labs (blood work) drawn today and your tests are completely normal, you will receive your results only by: MyChart Message (if you have MyChart) OR A paper copy in the mail If you have any lab test that is abnormal or we need to change your treatment, we will call you to review the results.   Testing/Procedures: None ordered   Follow-Up: At [facility], you and your health needs are our priority.  As part of our continuing mission to provide you with exceptional heart care, we have created designated Provider Care Teams.  These Care Teams include your primary Cardiologist (physician) and Advanced Practice Providers (APPs -  Physician Assistants and Nurse Practitioners) who all work together to provide you with the care you need, when you need it.  We recommend signing up for the  patient portal called "MyChart".  Sign up information is provided on this After Visit Summary.  MyChart is used to connect with patients for Virtual Visits (Telemedicine).  Patients are able to view lab/test results, encounter notes, upcoming appointments, etc.  Non-urgent messages can be sent to your provider as well.   To learn more about what you can do with MyChart, go to [email or web address].    Your next appointment:   12 month(s)  The format for your next appointment:   In Person  Provider:   Sanda Klein, MD      Important Information About Sugar         Signed, Sanda Klein, MD  04/26/2022 11:28 AM    [address], [place], [place]  [address] Phone: [phone]; Fax: [phone]

## 2022-04-25 NOTE — Patient Instructions (Signed)
Medication Instructions:  No changes *If you need a refill on your cardiac medications before your next appointment, please call your pharmacy*   Lab Work: None ordered If you have labs (blood work) drawn today and your tests are completely normal, you will receive your results only by: MyChart Message (if you have MyChart) OR A paper copy in the mail If you have any lab test that is abnormal or we need to change your treatment, we will call you to review the results.   Testing/Procedures: None ordered   Follow-Up: At [facility], you and your health needs are our priority.  As part of our continuing mission to provide you with exceptional heart care, we have created designated Provider Care Teams.  These Care Teams include your primary Cardiologist (physician) and Advanced Practice Providers (APPs -  Physician Assistants and Nurse Practitioners) who all work together to provide you with the care you need, when you need it.  We recommend signing up for the patient portal called "MyChart".  Sign up information is provided on this After Visit Summary.  MyChart is used to connect with patients for Virtual Visits (Telemedicine).  Patients are able to view lab/test results, encounter notes, upcoming appointments, etc.  Non-urgent messages can be sent to your provider as well.   To learn more about what you can do with MyChart, go to [email or web address].    Your next appointment:   12 month(s)  The format for your next appointment:   In Person  Provider:   Mihai Croitoru, MD      Important Information About Sugar       

## 2022-04-26 ENCOUNTER — Encounter: Payer: Self-pay | Admitting: Cardiovascular Disease

## 2022-04-26 DIAGNOSIS — I2721 Secondary pulmonary arterial hypertension: Secondary | ICD-10-CM | POA: Insufficient documentation

## 2022-04-26 DIAGNOSIS — I495 Sick sinus syndrome: Secondary | ICD-10-CM | POA: Insufficient documentation

## 2022-04-28 ENCOUNTER — Other Ambulatory Visit: Payer: Self-pay | Admitting: Allergy and Immunology

## 2022-04-29 DIAGNOSIS — D0512 Intraductal carcinoma in situ of left breast: Secondary | ICD-10-CM | POA: Diagnosis not present

## 2022-04-29 DIAGNOSIS — Z510 Encounter for antineoplastic radiation therapy: Secondary | ICD-10-CM | POA: Diagnosis not present

## 2022-04-29 DIAGNOSIS — Z170 Estrogen receptor positive status [ER+]: Secondary | ICD-10-CM | POA: Diagnosis not present

## 2022-04-30 ENCOUNTER — Telehealth: Payer: Self-pay | Admitting: Oncology

## 2022-04-30 DIAGNOSIS — J4550 Severe persistent asthma, uncomplicated: Secondary | ICD-10-CM

## 2022-04-30 NOTE — Telephone Encounter (Signed)
[name] called and asked if it is ok to receive allergy shots (Xolair) while she is getting radiation. She is scheduled for an injection tomorrow and will start radiation on Wednesday.

## 2022-04-30 NOTE — Telephone Encounter (Signed)
Called [name] back and advised her that it will be ok for her to receive the allergy shot tomorrow per Dr. Sondra Come.  [name] verbalized understanding and agreement.

## 2022-05-01 ENCOUNTER — Encounter: Payer: Medicare Other | Admitting: Family Medicine

## 2022-05-01 ENCOUNTER — Encounter: Payer: Self-pay | Admitting: Internal Medicine

## 2022-05-01 ENCOUNTER — Ambulatory Visit (INDEPENDENT_AMBULATORY_CARE_PROVIDER_SITE_OTHER): Payer: Medicare Other

## 2022-05-01 ENCOUNTER — Ambulatory Visit (INDEPENDENT_AMBULATORY_CARE_PROVIDER_SITE_OTHER): Payer: Medicare Other | Admitting: Internal Medicine

## 2022-05-01 ENCOUNTER — Encounter: Payer: Self-pay | Admitting: Family Medicine

## 2022-05-01 ENCOUNTER — Encounter: Payer: Self-pay | Admitting: *Deleted

## 2022-05-01 ENCOUNTER — Ambulatory Visit (INDEPENDENT_AMBULATORY_CARE_PROVIDER_SITE_OTHER): Payer: Medicare Other | Admitting: Family Medicine

## 2022-05-01 ENCOUNTER — Ambulatory Visit: Payer: Medicare Other | Admitting: Internal Medicine

## 2022-05-01 VITALS — BP 110/64 | HR 75 | Temp 98.1°F | Ht 64.25 in | Wt 195.5 lb

## 2022-05-01 VITALS — BP 120/74 | HR 91 | Ht 64.25 in | Wt 197.0 lb

## 2022-05-01 DIAGNOSIS — E041 Nontoxic single thyroid nodule: Secondary | ICD-10-CM | POA: Diagnosis not present

## 2022-05-01 DIAGNOSIS — I152 Hypertension secondary to endocrine disorders: Secondary | ICD-10-CM | POA: Diagnosis not present

## 2022-05-01 DIAGNOSIS — E785 Hyperlipidemia, unspecified: Secondary | ICD-10-CM

## 2022-05-01 DIAGNOSIS — I272 Pulmonary hypertension, unspecified: Secondary | ICD-10-CM | POA: Diagnosis not present

## 2022-05-01 DIAGNOSIS — E1169 Type 2 diabetes mellitus with other specified complication: Secondary | ICD-10-CM

## 2022-05-01 DIAGNOSIS — E1159 Type 2 diabetes mellitus with other circulatory complications: Secondary | ICD-10-CM | POA: Diagnosis not present

## 2022-05-01 DIAGNOSIS — E0590 Thyrotoxicosis, unspecified without thyrotoxic crisis or storm: Secondary | ICD-10-CM | POA: Diagnosis not present

## 2022-05-01 DIAGNOSIS — Z0000 Encounter for general adult medical examination without abnormal findings: Secondary | ICD-10-CM | POA: Diagnosis not present

## 2022-05-01 DIAGNOSIS — E1340 Other specified diabetes mellitus with diabetic neuropathy, unspecified: Secondary | ICD-10-CM

## 2022-05-01 DIAGNOSIS — I480 Paroxysmal atrial fibrillation: Secondary | ICD-10-CM | POA: Diagnosis not present

## 2022-05-01 DIAGNOSIS — J4550 Severe persistent asthma, uncomplicated: Secondary | ICD-10-CM

## 2022-05-01 DIAGNOSIS — D0512 Intraductal carcinoma in situ of left breast: Secondary | ICD-10-CM | POA: Diagnosis not present

## 2022-05-01 DIAGNOSIS — E1140 Type 2 diabetes mellitus with diabetic neuropathy, unspecified: Secondary | ICD-10-CM

## 2022-05-01 DIAGNOSIS — J4540 Moderate persistent asthma, uncomplicated: Secondary | ICD-10-CM

## 2022-05-01 DIAGNOSIS — E0500 Thyrotoxicosis with diffuse goiter without thyrotoxic crisis or storm: Secondary | ICD-10-CM | POA: Diagnosis not present

## 2022-05-01 DIAGNOSIS — I5032 Chronic diastolic (congestive) heart failure: Secondary | ICD-10-CM

## 2022-05-01 DIAGNOSIS — F325 Major depressive disorder, single episode, in full remission: Secondary | ICD-10-CM

## 2022-05-01 NOTE — Assessment & Plan Note (Signed)
Chronic followed by endocrinology on methimazole.

## 2022-05-01 NOTE — Progress Notes (Signed)
Patient ID: [name], female    DOB: [birth year], 76 y.o.   MRN: [id number]  This visit was conducted in person.  BP 110/64   Pulse 75   Temp 98.1 F (36.7 C) (Oral)   Ht 5' 4.25" (1.632 m)   Wt 195 lb 8 oz (88.7 kg)   SpO2 94%   BMI 33.30 kg/m    CC:  Chief Complaint  Patient presents with   Medicare Wellness    Subjective:   HPI: [name] is a 76 y.o. female presenting on 05/01/2022 for Medicare Wellness  The patient presents for annual medicare wellness, complete physical and review of chronic health problems. He/She also has the following acute concerns today:   Hypertension:  Blood pressure well controlled on irbesartan 150 mg p.o. daily BP Readings from Last 3 Encounters:  05/01/22 110/64  04/25/22 135/68  04/25/22 135/70  Using medication without problems or lightheadedness:  Chest pain with exertion: none Edema:  off and on on left chronically since knee replacement. Short of breath: stable Average home BPs: Other issues: Wt Readings from Last 3 Encounters:  05/01/22 195 lb 8 oz (88.7 kg)  04/25/22 190 lb (86.2 kg)  04/25/22 190 lb 12.8 oz (86.5 kg)   Overactive bladder followed by urology Dr. Arther Dames.  Treated with both oxybutynin and tolterodine.  Diabetes:  Lab Results  Component Value Date   HGBA1C 5.6 03/22/2022  Using medications without difficulties: Hypoglycemic episodes: Hyperglycemic episodes: Feet problems: none Blood Sugars averaging: eye exam within last year: yes  Elevated Cholesterol: Cholesterol well controlled on Lipitor 10 mg p.o. 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 Brantley Stage. Currently undergoing adjuvant  radiation and antiestrogen therapy. Reviewed last general surgery office visit from October 13 as well as last oncology visit from April 10, 2022   Paroxysmal atrial fibrillation followed by Dr. Sallyanne Kuster reviewed last office visit April 25, 2022 On Xarelto anticoagulation.  Asthma moderate persistent followed by allergist and pulmonary.  On Spiriva and Flovent.  Relevant past medical, surgical, family and social history reviewed and updated as indicated. Interim medical history since our last visit reviewed. Allergies and medications reviewed and updated. Outpatient Medications Prior to Visit  Medication Sig Dispense Refill   acetaminophen (TYLENOL) 500 MG tablet Take 500-1,000 mg by mouth every 6 (six) hours as needed for moderate pain.     ADVAIR HFA 814-48 MCG/ACT inhaler INHALE 2 PUFFS BY MOUTH TWICE DAILY TO PREVENT COUGH OR WHEEZING. RINSE MOUTH AFTER USE 12 g 2   albuterol (VENTOLIN HFA) 108 (90 Base) MCG/ACT inhaler INHALE 2 PUFFS BY MOUTH EVERY 6 HOURS AS NEEDED FOR WHEEZING OR SHORTNESS OF BREATH 18 g 1   Artificial Saliva (BIOTENE DRY MOUTH) LOZG Use as directed 1 lozenge in the mouth or throat daily as needed (dry mouth).     atorvastatin (LIPITOR) 10 MG tablet TAKE 1 TABLET(10 MG) BY MOUTH DAILY 90 tablet 3   Blood Glucose Monitoring Suppl (ONETOUCH VERIO) w/Device KIT Use to check blood sugar up to 2 times a day 1 kit 0   Coenzyme Q10 (COQ10) 100 MG CAPS Take 100 mg by mouth daily.     cyclobenzaprine (  FLEXERIL) 10 MG tablet Take 0.5-1 tablets (5-10 mg total) by mouth at bedtime as needed for muscle spasms. 20 tablet 0   Empagliflozin-linaGLIPtin (GLYXAMBI) 10-5 MG TABS Take 1 tablet by mouth daily. 30 tablet 5   EPINEPHrine 0.3 mg/0.3 mL IJ SOAJ injection USE AS DIRECTED FOR LIFE THREATENING ALLERGIC REACTIONS 2 each 2   estradiol (ESTRACE) 0.1 MG/GM vaginal cream Place 1 Applicatorful vaginally 2 (two) times a week. (As Needed)     famotidine (PEPCID) 40 MG tablet Take 1  (ONE) tablet by mouth every evening. 90 tablet 1   fluticasone (FLOVENT HFA) 220 MCG/ACT inhaler Inhale 2 puffs into the lungs 2 (two) times daily as needed (shortness of breath).     gabapentin (NEURONTIN) 600 MG tablet TAKE 1 TABLET BY MOUTH EVERY DAY AT BREAKFAST, 1 TABLET AT LUNCH AND 2 TABLETS AT BEDTIME 360 tablet 1   glucose blood (ONETOUCH VERIO) test strip 1 strip 2 (two) times daily     hydrOXYzine (ATARAX/VISTARIL) 10 MG tablet Take 1 tablet (10 mg total) by mouth daily as needed for itching. 30 tablet 2   ipratropium (ATROVENT) 0.06 % nasal spray 1-2 sprays in each nostril 1-2 times per day as needed to dry nose 15 mL 5   irbesartan (AVAPRO) 150 MG tablet Take 1 tablet (150 mg total) by mouth daily. 90 tablet 2   Lactase (DAIRY-RELIEF PO) Take 1 capsule by mouth daily as needed (eating dairy).      Lancets (ONETOUCH DELICA PLUS [id number]) MISC USE TO CHECK BLOOD SUGAR UP TO TWICE DAILY 100 each 5   levalbuterol (XOPENEX) 1.25 MG/3ML nebulizer solution USE 1 VIAL VIA NEBULIZER EVERY 6 HOURS AS NEEDED FOR SHORTNESS OF BREATH OR WHEEZING 90 mL 3   liver oil-zinc oxide (DESITIN) 40 % ointment Apply 1 application topically as needed for irritation.     MAGNESIUM GLYCINATE PO Take 2 tablets by mouth 2 (two) times daily.     Menthol, Topical Analgesic, (ABSORBINE PLUS JR EX) Apply 1 patch topically daily as needed (pain).     Menthol-Methyl Salicylate (SALONPAS JET SPRAY EX) Apply 1 spray topically daily as needed (knee pain).     METAMUCIL FIBER PO Take 1 Dose by mouth daily. 1 dose = 1/2 tablespoon     methimazole (TAPAZOLE) 5 MG tablet Take 1 tablet (5 mg total) by mouth daily. 90 tablet 3   montelukast (SINGULAIR) 10 MG tablet Take 1 tablet (10 mg total) by mouth at bedtime. For asthma control. 90 tablet 1   Mouthwashes (BIOTENE DRY MOUTH MT) Use as directed 1 Dose in the mouth or throat daily as needed (dry mouth).     nystatin (MYCOSTATIN) 100000 UNIT/ML suspension 28m swish and swallow  after Advair 2 times daily. 473 mL 5   ondansetron (ZOFRAN-ODT) 8 MG disintegrating tablet Take 1 tablet (8 mg total) by mouth every 8 (eight) hours as needed for nausea or vomiting. 20 tablet 0   ONETOUCH VERIO test strip USE TO CHECK BLOOD SUGAR UP TO TWICE DAILY AS DIRECTED 100 strip 5   oxybutynin (DITROPAN-XL) 10 MG 24 hr tablet Take 10 mg by mouth at bedtime.     pantoprazole (PROTONIX) 40 MG tablet Take 1 (ONE) tablet by mouth every morning. 90 tablet 1   Polyethyl Glycol-Propyl Glycol (SYSTANE OP) Place 1 drop into both eyes daily as needed (dry eyes).     polyethylene glycol powder (GLYCOLAX/MIRALAX) 17 GM/SCOOP powder Take 1 Container by mouth once.  PRESCRIPTION MEDICATION Inhale into the lungs at bedtime. CPAP     Tiotropium Bromide Monohydrate (SPIRIVA RESPIMAT) 1.25 MCG/ACT AERS Inhale 2 each into the lungs daily.     tolterodine (DETROL LA) 4 MG 24 hr capsule Take 4 mg by mouth at bedtime.     traMADol (ULTRAM) 50 MG tablet Take 1-2 tablets (50-100 mg total) by mouth every 6 (six) hours as needed for moderate pain or severe pain (50 mg for moderate, 100 mg for severe). 20 tablet 0   traMADol (ULTRAM) 50 MG tablet Take 1 tablet (50 mg total) by mouth every 6 (six) hours as needed. 20 tablet 0   XARELTO 20 MG TABS tablet TAKE 1 TABLET(20 MG) BY MOUTH DAILY WITH SUPPER 90 tablet 1   estradiol (ESTRACE) 0.1 MG/GM vaginal cream 0.5 g of cream intravaginally administered daily for 2 weeks, then reduce to twice weekly (Patient taking differently: Place 1 Applicatorful vaginally daily as needed (irritation).) 42.5 g 12   Plecanatide (TRULANCE) 3 MG TABS Take 3 mg by mouth daily as needed (constipation). (Patient not taking: Reported on 04/25/2022)     Facility-Administered Medications Prior to Visit  Medication Dose Route Frequency Provider Last Rate Last Admin   omalizumab Arvid Right) injection 300 mg  300 mg Subcutaneous Q28 days Kozlow, Donnamarie Poag, MD   300 mg at 04/03/22 1005     Per HPI  unless specifically indicated in ROS section below Review of Systems  Constitutional:  Negative for fatigue and fever.  HENT:  Negative for congestion.   Eyes:  Negative for pain.  Respiratory:  Negative for cough and shortness of breath.   Cardiovascular:  Negative for chest pain, palpitations and leg swelling.  Gastrointestinal:  Negative for abdominal pain.  Genitourinary:  Negative for dysuria and vaginal bleeding.  Musculoskeletal:  Negative for back pain.  Neurological:  Negative for syncope, light-headedness and headaches.  Psychiatric/Behavioral:  Negative for dysphoric mood.    Objective:  BP 110/64   Pulse 75   Temp 98.1 F (36.7 C) (Oral)   Ht 5' 4.25" (1.632 m)   Wt 195 lb 8 oz (88.7 kg)   SpO2 94%   BMI 33.30 kg/m   Wt Readings from Last 3 Encounters:  05/01/22 195 lb 8 oz (88.7 kg)  04/25/22 190 lb (86.2 kg)  04/25/22 190 lb 12.8 oz (86.5 kg)      Physical Exam Constitutional:      General: She is not in acute distress.    Appearance: Normal appearance. She is well-developed. She is not ill-appearing or toxic-appearing.  HENT:     Head: Normocephalic.     Right Ear: Hearing, tympanic membrane, ear canal and external ear normal. Tympanic membrane is not erythematous, retracted or bulging.     Left Ear: Hearing, tympanic membrane, ear canal and external ear normal. Tympanic membrane is not erythematous, retracted or bulging.     Nose: No mucosal edema or rhinorrhea.     Right Sinus: No maxillary sinus tenderness or frontal sinus tenderness.     Left Sinus: No maxillary sinus tenderness or frontal sinus tenderness.     Mouth/Throat:     Pharynx: Uvula midline.  Eyes:     General: Lids are normal. Lids are everted, no foreign bodies appreciated.     Conjunctiva/sclera: Conjunctivae normal.     Pupils: Pupils are equal, round, and reactive to light.  Neck:     Thyroid: No thyroid mass or thyromegaly.     Vascular: No carotid  bruit.     Trachea: Trachea  normal.  Cardiovascular:     Rate and Rhythm: Normal rate and regular rhythm.     Pulses: Normal pulses.     Heart sounds: Normal heart sounds, S1 normal and S2 normal. No murmur heard.    No friction rub. No gallop.  Pulmonary:     Effort: Pulmonary effort is normal. No tachypnea or respiratory distress.     Breath sounds: Normal breath sounds. No decreased breath sounds, wheezing, rhonchi or rales.  Abdominal:     General: Bowel sounds are normal.     Palpations: Abdomen is soft.     Tenderness: There is no abdominal tenderness.  Musculoskeletal:     Cervical back: Normal range of motion and neck supple.  Skin:    General: Skin is warm and dry.     Findings: No rash.  Neurological:     Mental Status: She is alert.  Psychiatric:        Mood and Affect: Mood is not anxious or depressed.        Speech: Speech normal.        Behavior: Behavior normal. Behavior is cooperative.        Thought Content: Thought content normal.        Judgment: Judgment normal.   Diabetic foot exam: Normal inspection No skin breakdown Has prediabetic  calluses  Normal DP pulses decreased sensation to light touch and monofilament Nails normal     Results for orders placed or performed in visit on 04/24/22  Comprehensive metabolic panel  Result Value Ref Range   Sodium 140 135 - 145 mEq/L   Potassium 4.2 3.5 - 5.1 mEq/L   Chloride 106 96 - 112 mEq/L   CO2 26 19 - 32 mEq/L   Glucose, Bld 91 70 - 99 mg/dL   BUN 13 6 - 23 mg/dL   Creatinine, Ser 1.04 0.40 - 1.20 mg/dL   Total Bilirubin 0.8 0.2 - 1.2 mg/dL   Alkaline Phosphatase 88 39 - 117 U/L   AST 17 0 - 37 U/L   ALT 9 0 - 35 U/L   Total Protein 7.1 6.0 - 8.3 g/dL   Albumin 4.0 3.5 - 5.2 g/dL   GFR 52.49 (L) >60.00 mL/min   Calcium 9.3 8.4 - 10.5 mg/dL  Lipid panel  Result Value Ref Range   Cholesterol 106 0 - 200 mg/dL   Triglycerides 49.0 0.0 - 149.0 mg/dL   HDL 56.40 >39.00 mg/dL   VLDL 9.8 0.0 - 40.0 mg/dL   LDL Cholesterol 40 0  - 99 mg/dL   Total CHOL/HDL Ratio 2    NonHDL 49.83    *Note: Due to a large number of results and/or encounters for the requested time period, some results have not been displayed. A complete set of results can be found in Results Review.     COVID 19 screen:  No recent travel or known exposure to COVID19 The patient denies respiratory symptoms of COVID 19 at this time. The importance of social distancing was discussed today.   Assessment and Plan The patient's preventative maintenance and recommended screening tests for an annual wellness exam were reviewed in full today. Brought up to date unless services declined.  Counselled on the importance of diet, exercise, and its role in overall health and mortality. The patient's FH and SH was reviewed, including their home life, tobacco status, and drug and alcohol status.   Mammogram: currently treated for breast cancer DEXA  2016..  scheduled  PAP not indicated Hep C neg  Vaccines: uptodate with tdap, PNA, flu uptodate, and COVID x 5, she says she has had shingles series.  Colonoscopy 2019, repeat in 5 years   Problem List Items Addressed This Visit     Asthma, moderate persistent    followed by allergist and pulmonary.  On Spiriva and Flovent.      Hyperlipidemia associated with type 2 diabetes mellitus (HCC) (Chronic)   Chronic diastolic heart failure (HCC)    Chronic, euvolemic in office today.  She does have some intermittent swelling in right lower leg following right total knee replacement.      Depression, major, in remission ([place])    Chronic, now well controlled and in  remission on no medication      Ductal carcinoma in situ (DCIS) of left breast    Recent ductal carcinoma in situ left breast treated with left breast lumpectomy by Dr. Brantley Stage. Currently undergoing adjuvant radiation and antiestrogen therapy. Reviewed last general surgery office visit from October 13 as well as last oncology visit from April 10, 2022      Hypertension associated with diabetes ([place])    Chronic, well controlled on irbesartan 150 mg daily      Hyperthyroidism    Chronic followed by endocrinology on methimazole.      Neuropathy due to secondary diabetes ([place])    Chronic, secondary to diabetes minimally problematic for patient currently.      Paroxysmal atrial fibrillation (HCC)    On Xarelto anticoagulation, rate controlled.  Followed by cardiology      Pulmonary hypertension ([place])    Followed by cardiology      RESOLVED: Type 2 diabetes mellitus with diabetic neuropathy, without long-term current use of insulin ([place])   Type 2 diabetes mellitus with diabetic neuropathy, unspecified ([place])    Chronic, well controlled with diet      Other Visit Diagnoses     Medicare annual wellness visit, subsequent    -  Primary   Paroxysmal atrial fibrillation with rapid ventricular response [facility])          Patient Care Team: [name], MD as PCP - General (Family Medicine) Croitoru, Dani Gobble, MD as PCP - Cardiology (Cardiology) Hortencia Pilar, MD as Consulting Physician (Ophthalmology) Neldon Mc, Donnamarie Poag, MD as Consulting Physician (Allergy and Immunology) Anda Kraft, MD as Consulting Physician (Endocrinology) Inocencio Homes, [place] as Consulting Physician (Podiatry) Croitoru, Dani Gobble, MD as Consulting Physician (Cardiology) Druscilla Brownie, MD as Consulting Physician (Dermatology) Boyd Kerbs, MD as Referring Physician (Specialist) [place], Selinda Eon, [place] as Referring Physician (Chiropractic Medicine) Ronald Lobo, MD as Consulting Physician (Gastroenterology) Hallows, Verlee Monte, MD (Orthopedic Surgery) Dohmeier, Asencion Partridge, MD as Consulting Physician (Neurology) Rockwell Germany, RN as Oncology Nurse Navigator Tressie Ellis, Paulette Blanch, RN as Oncology Nurse Navigator Erroll Luna, MD as Consulting Physician (General Surgery) [name], MD as Consulting Physician (Hematology and Oncology) Gery Pray, MD as  Consulting Physician ([place]) Charlton Haws, [facility] as Pharmacist (Pharmacist)   [name], MD

## 2022-05-01 NOTE — Assessment & Plan Note (Signed)
Recent ductal carcinoma in situ left breast treated with left breast lumpectomy by Dr. Brantley Stage. Currently undergoing adjuvant radiation and antiestrogen therapy. Reviewed last general surgery office visit from October 13 as well as last oncology visit from April 10, 2022

## 2022-05-01 NOTE — Assessment & Plan Note (Signed)
Followed by cardiology 

## 2022-05-01 NOTE — Assessment & Plan Note (Signed)
Chronic, well controlled with diet. 

## 2022-05-01 NOTE — Assessment & Plan Note (Signed)
followed by allergist and pulmonary.  On Spiriva and Flovent.

## 2022-05-01 NOTE — Assessment & Plan Note (Signed)
On Xarelto anticoagulation, rate controlled.  Followed by cardiology

## 2022-05-01 NOTE — Assessment & Plan Note (Signed)
Chronic, now well controlled and in  remission on no medication

## 2022-05-01 NOTE — Progress Notes (Unsigned)
Patient ID: [name], female   DOB: May 25, 1946, 76 y.o.   MRN: [id number]   HPI  [name] is a 76 y.o.-year-old female, returning for f/u for for thyrotoxicosis and thyroid nodule.  She previously saw another endocrinologist (Dr. Wilson Singer), but only for her diabetes. She is the sister of [name], also my pt. Last OV with me 6 months ago.  Interim history: She denies tremors, palpitations, heat intolerance. She had a R TKR in 10/2021. She had cholecystectomy in 12/2021. Since last visit, she was diagnosed with breast cancer. She had breast surgery in 03/2022. She will start RxTx tomorrow  - for 6 weeks.  Reviewed and addended history: Patient was found to be thyrotoxic in 08/2015 but she reports that she was only informed about this in 2018.  Thyroid scan indicated possible Graves' disease and also a cold left thyroid nodule.    At that time, she was recommended surgery but she did not want to proceed with this and started to see me.    She was started on methimazole initially 10 mg daily.  Doses were changed afterwards:  Most recently: In 09/2018 we decreased the dose of methimazole to 2.5 mg daily.   In 04/2020, we stopped methimazole In 05/2020, we restarted methimazole 5 mg daily (she misunderstood instructions to only start 2.5 mg daily) In 10/2020, we decreased the methimazole to 2.5 mg daily In 12/2020, we decreased methimazole to 2.5 mg every other day In 01/2021, we stopped methimazole In 05/2021, we restarted methimazole 5 mg daily  She also continues on diltiazem CD.  Her left thyroid nodule was biopsied with benign results in 2019.    Reviewed her TFTs: Lab Results  Component Value Date   TSH 0.64 10/31/2021   TSH 0.71 06/27/2021   TSH 0.13 (L) 05/30/2021   TSH 0.25 (L) 05/02/2021   TSH 1.23 01/18/2021   TSH 4.07 12/15/2020   TSH 3.16 11/29/2020   TSH 4.38 10/18/2020   TSH 2.27 07/21/2020   TSH 0.08 (L) 06/14/2020   FREET4 1.39 10/31/2021    FREET4 0.99 06/27/2021   FREET4 1.49 05/30/2021   FREET4 1.09 05/02/2021   FREET4 1.03 01/18/2021   FREET4 1.11 12/15/2020   FREET4 1.05 11/29/2020   FREET4 0.90 10/18/2020   FREET4 1.06 07/21/2020   FREET4 1.31 06/14/2020   T3FREE 2.7 10/31/2021   T3FREE 3.3 06/27/2021   T3FREE 3.0 05/30/2021   T3FREE 2.6 05/02/2021   T3FREE 3.0 01/18/2021   T3FREE 2.6 12/15/2020   T3FREE 2.7 11/29/2020   T3FREE 2.3 10/18/2020   T3FREE 2.5 07/21/2020   T3FREE 3.8 06/14/2020  09/29/2021: TSH 5.81 (0.34-5.66)  Thyroid uptake and scan (10/31/2017): Increased uptake of 40%, and cold left inferior pole nodule.  Thyroid U/S (11/12/2017): 3.2 cm left inferior TR 3 nodule correlates with the cold nodule by nuclear medicine scan. This nodule meets criteria for biopsy as above.  FNA of her left thyroid nodule (12/04/2017): Benign  Thyroid U/S (10/23/2019): Slightly enlarged gland but not due to enlargement of the thyroid nodule. It is possible that this is due to more inflammation. Parenchymal Echotexture: Mildly heterogenous Isthmus: Normal in size measuring 0.6 cm in diameter, unchanged Right lobe: Normal in size measuring 5.3 x 1.4 x 1.6 cm, previously, 4.1 x 1.2 x 1.5 cm Left lobe: Enlarged measuring 6.9 x 3.9 x 2.4 cm, previously, 6.1 x 2.1 x 2.3 cm. ___________________________________________________________   The previously biopsied approximately 3.0 x 2.7 x 1.9 cm isoechoic ill-defined  nodule within the mid/inferior aspect of the left lobe of the thyroid is grossly unchanged in size compared to the 10/2017 examination, previously, 3.2 x 2.5 x 2.0 cm. Correlation with previous biopsy results is advised.   IMPRESSION: 1. Similar appearing mildly heterogeneous and borderline enlarged thyroid gland without worrisome new or enlarging thyroid nodule. 2. Previously biopsied nodule within the mid/inferior aspect the left lobe of the thyroid is grossly unchanged compared to the 10/2017 examination.  Correlation with previous biopsy results is advised. Assuming a benign pathologic diagnosis, repeat sampling and/or continued dedicated follow-up is not recommend  Her Graves' antibodies were elevated: Lab Results  Component Value Date   TSI 485 (H) 05/30/2021   TSI 376 (H) 04/26/2020   TSI 331 (H) 11/03/2019   TSI 395 (H) 04/06/2019   TSI 361 (H) 02/06/2018   Pt denies: - feeling nodules in neck - hoarseness - dysphagia Rarely choking, with drier foods. She has to chew the food well.  No FH of thyroid disease or cancer. No h/o radiation tx to head or neck. No current biotin use. No recent steroids use.    She also has a history of type 2 diabetes - well controlled - managed by PCP. Lab Results  Component Value Date   HGBA1C 5.6 03/22/2022   She has asthma, on 2 inhalers. Sees Dr. Neldon Mc. She had cardioversion on 11/23/2020 - A. Fib with SOB.  She fell in 07/2021 >> concussion.   ROS:  + See HPI  I reviewed pt's medications, allergies, PMH, social hx, family hx, and changes were documented in the history of present illness. Otherwise, unchanged from my initial visit note.  Past Medical History:  Diagnosis Date   Allergic rhinitis    Allergy    Arthritis    Asthma    Breast cancer ([place])    Chronic headache    Diabetes mellitus    Ductal carcinoma in situ (DCIS) of left breast 02/23/2022   DVT (deep venous thrombosis) (HCC)    Dyspnea    Heart murmur    Hypertension    Penicillin allergy 01/19/2020   Pulmonary embolism ([place])    Pulmonary hypertension (HCC)    Seizures ([place])    Past Surgical History:  Procedure Laterality Date   ABDOMINAL HYSTERECTOMY     partial, has ovaries   BREAST LUMPECTOMY WITH RADIOACTIVE SEED LOCALIZATION Left 03/29/2022   Procedure: LEFT BREAST LUMPECTOMY WITH RADIOACTIVE SEED LOCALIZATION;  Surgeon: Erroll Luna, MD;  Location: [facility];  Service: General;  Laterality: Left;   CARDIAC CATHETERIZATION  12/28/2010   Mod. pulmonary  hypertension, normal coronary arteries   CARDIOVERSION N/A 11/23/2020   Procedure: CARDIOVERSION;  Surgeon: Sanda Klein, MD;  Location: [place];  Service: Cardiovascular;  Laterality: N/A;   CHOLECYSTECTOMY N/A 12/20/2021   Procedure: LAPAROSCOPIC CHOLECYSTECTOMY WITH INTRAOPERATIVE CHOLANGIOGRAM;  Surgeon: Armandina Gemma, MD;  Location: [facility];  Service: General;  Laterality: N/A;   DOPPLER ECHOCARDIOGRAPHY  10/08/2011   EF=>55%,mild asymmetric LVH, mod. TR, mod. PH, mild to mod LA dilatation   IR LUMBAR [place] W/IMG GUIDE  01/12/2020   KNEE ARTHROSCOPY Left    KNEE SURGERY     Nuclear Stress Test  05/20/2006   No ischemia   PARTIAL HYSTERECTOMY     PLANTAR FASCIA SURGERY     TONSILLECTOMY     Social History   Socioeconomic History   Marital status: Widowed    Spouse name: Not on file   Number of children: Y  Years of education: Not on file   Highest education level: Not on file  Occupational History   Occupation: retired Geologist, engineering.   Tobacco Use   Smoking status: Never   Smokeless tobacco: Never  Vaping Use   Vaping Use: Never used  Substance and Sexual Activity   Alcohol use: Not Currently    Alcohol/week: 0.0 standard drinks of alcohol    Comment: occ glass on wine   Drug use: No   Sexual activity: Never  Other Topics Concern   Not on file  Social History Narrative   Widow    limited exercise.   Social Determinants of Health   Financial Resource Strain: Medium Risk (02/28/2022)   Overall Financial Resource Strain (CARDIA)    Difficulty of Paying Living Expenses: Somewhat hard  Food Insecurity: No Food Insecurity (02/28/2022)   Hunger Vital Sign    Worried About Running Out of Food in the Last Year: Never true    Ran Out of Food in the Last Year: Never true  Transportation Needs: No Transportation Needs (02/28/2022)   PRAPARE - Hydrologist (Medical): No    Lack of Transportation (Non-Medical): No  Physical Activity:  Not on file  Stress: Not on file  Social Connections: Not on file  Intimate Partner Violence: Not on file   Current Outpatient Medications on File Prior to Visit  Medication Sig Dispense Refill   acetaminophen (TYLENOL) 500 MG tablet Take 500-1,000 mg by mouth every 6 (six) hours as needed for moderate pain.     ADVAIR HFA 390-30 MCG/ACT inhaler INHALE 2 PUFFS BY MOUTH TWICE DAILY TO PREVENT COUGH OR WHEEZING. RINSE MOUTH AFTER USE 12 g 2   albuterol (VENTOLIN HFA) 108 (90 Base) MCG/ACT inhaler INHALE 2 PUFFS BY MOUTH EVERY 6 HOURS AS NEEDED FOR WHEEZING OR SHORTNESS OF BREATH 18 g 1   Artificial Saliva (BIOTENE DRY MOUTH) LOZG Use as directed 1 lozenge in the mouth or throat daily as needed (dry mouth).     atorvastatin (LIPITOR) 10 MG tablet TAKE 1 TABLET(10 MG) BY MOUTH DAILY 90 tablet 3   Blood Glucose Monitoring Suppl (ONETOUCH VERIO) w/Device KIT Use to check blood sugar up to 2 times a day 1 kit 0   Coenzyme Q10 (COQ10) 100 MG CAPS Take 100 mg by mouth daily.     cyclobenzaprine (FLEXERIL) 10 MG tablet Take 0.5-1 tablets (5-10 mg total) by mouth at bedtime as needed for muscle spasms. 20 tablet 0   Empagliflozin-linaGLIPtin (GLYXAMBI) 10-5 MG TABS Take 1 tablet by mouth daily. 30 tablet 5   EPINEPHrine 0.3 mg/0.3 mL IJ SOAJ injection USE AS DIRECTED FOR LIFE THREATENING ALLERGIC REACTIONS 2 each 2   estradiol (ESTRACE) 0.1 MG/GM vaginal cream Place 1 Applicatorful vaginally 2 (two) times a week. (As Needed)     famotidine (PEPCID) 40 MG tablet Take 1 (ONE) tablet by mouth every evening. 90 tablet 1   fluticasone (FLOVENT HFA) 220 MCG/ACT inhaler Inhale 2 puffs into the lungs 2 (two) times daily as needed (shortness of breath).     gabapentin (NEURONTIN) 600 MG tablet TAKE 1 TABLET BY MOUTH EVERY DAY AT BREAKFAST, 1 TABLET AT LUNCH AND 2 TABLETS AT BEDTIME 360 tablet 1   glucose blood (ONETOUCH VERIO) test strip 1 strip 2 (two) times daily     hydrOXYzine (ATARAX/VISTARIL) 10 MG tablet  Take 1 tablet (10 mg total) by mouth daily as needed for itching. 30 tablet 2   ipratropium (  ATROVENT) 0.06 % nasal spray 1-2 sprays in each nostril 1-2 times per day as needed to dry nose 15 mL 5   irbesartan (AVAPRO) 150 MG tablet Take 1 tablet (150 mg total) by mouth daily. 90 tablet 2   Lactase (DAIRY-RELIEF PO) Take 1 capsule by mouth daily as needed (eating dairy).      Lancets (ONETOUCH DELICA PLUS [id number]) MISC USE TO CHECK BLOOD SUGAR UP TO TWICE DAILY 100 each 5   levalbuterol (XOPENEX) 1.25 MG/3ML nebulizer solution USE 1 VIAL VIA NEBULIZER EVERY 6 HOURS AS NEEDED FOR SHORTNESS OF BREATH OR WHEEZING 90 mL 3   liver oil-zinc oxide (DESITIN) 40 % ointment Apply 1 application topically as needed for irritation.     MAGNESIUM GLYCINATE PO Take 2 tablets by mouth 2 (two) times daily.     Menthol, Topical Analgesic, (ABSORBINE PLUS JR EX) Apply 1 patch topically daily as needed (pain).     Menthol-Methyl Salicylate (SALONPAS JET SPRAY EX) Apply 1 spray topically daily as needed (knee pain).     METAMUCIL FIBER PO Take 1 Dose by mouth daily. 1 dose = 1/2 tablespoon     methimazole (TAPAZOLE) 5 MG tablet Take 1 tablet (5 mg total) by mouth daily. 90 tablet 3   montelukast (SINGULAIR) 10 MG tablet Take 1 tablet (10 mg total) by mouth at bedtime. For asthma control. 90 tablet 1   Mouthwashes (BIOTENE DRY MOUTH MT) Use as directed 1 Dose in the mouth or throat daily as needed (dry mouth).     nystatin (MYCOSTATIN) 100000 UNIT/ML suspension 55m swish and swallow after Advair 2 times daily. 473 mL 5   ondansetron (ZOFRAN-ODT) 8 MG disintegrating tablet Take 1 tablet (8 mg total) by mouth every 8 (eight) hours as needed for nausea or vomiting. 20 tablet 0   ONETOUCH VERIO test strip USE TO CHECK BLOOD SUGAR UP TO TWICE DAILY AS DIRECTED 100 strip 5   oxybutynin (DITROPAN-XL) 10 MG 24 hr tablet Take 10 mg by mouth at bedtime.     pantoprazole (PROTONIX) 40 MG tablet Take 1 (ONE) tablet by mouth every  morning. 90 tablet 1   Plecanatide (TRULANCE) 3 MG TABS Take 3 mg by mouth daily as needed (constipation). (Patient not taking: Reported on 04/25/2022)     Polyethyl Glycol-Propyl Glycol (SYSTANE OP) Place 1 drop into both eyes daily as needed (dry eyes).     polyethylene glycol powder (GLYCOLAX/MIRALAX) 17 GM/SCOOP powder Take 1 Container by mouth once.     PRESCRIPTION MEDICATION Inhale into the lungs at bedtime. CPAP     Tiotropium Bromide Monohydrate (SPIRIVA RESPIMAT) 1.25 MCG/ACT AERS Inhale 2 each into the lungs daily.     tolterodine (DETROL LA) 4 MG 24 hr capsule Take 4 mg by mouth at bedtime.     traMADol (ULTRAM) 50 MG tablet Take 1-2 tablets (50-100 mg total) by mouth every 6 (six) hours as needed for moderate pain or severe pain (50 mg for moderate, 100 mg for severe). 20 tablet 0   traMADol (ULTRAM) 50 MG tablet Take 1 tablet (50 mg total) by mouth every 6 (six) hours as needed. 20 tablet 0   XARELTO 20 MG TABS tablet TAKE 1 TABLET(20 MG) BY MOUTH DAILY WITH SUPPER 90 tablet 1   Current Facility-Administered Medications on File Prior to Visit  Medication Dose Route Frequency Provider Last Rate Last Admin   omalizumab (Arvid Right injection 300 mg  300 mg Subcutaneous Q28 days Kozlow, EDonnamarie Poag MD  300 mg at 04/03/22 1005   Allergies  Allergen Reactions   Dilantin [Phenytoin] Swelling    facial swelling   Latex Hives   Oxycodone Nausea And Vomiting and Nausea Only    Abdominal Pain, Vomiting   Penicillins Nausea And Vomiting and Swelling    Has patient had a PCN reaction causing immediate rash, facial/tongue/throat swelling, SOB or lightheadedness with hypotension patient had a PCN reaction causing severe rash involving mucus membranes or skin necrosis: [id number]} Has patient had a PCN reaction that required hospitalization/No Has patient had a PCN reaction occurring within the last 10 years: No If all of the above answers are "NO", then may proceed with Cephalosporin use.       Codeine Nausea Only    Tolerates tylenol with codeine   Hydrocodone Nausea And Vomiting   Nickel Hives and Rash   Pregabalin Itching and Other (See Comments)    headaches/problem w/vision   Family History  Problem Relation Age of Onset   Cancer Mother        breast and colon cancer   Hypertension Mother    Clotting disorder Mother    Breast cancer Mother 87   Arthritis Mother    Stroke Mother    Diabetes Mother    Colon cancer Mother 31 - 8   Alcohol abuse Father    Cancer Sister        breast cancer   Multiple sclerosis Sister    Cancer Sister        colon cancer   Cancer Brother        colon cancer   Prostate cancer Half-Brother    Stomach cancer Half-Brother    Breast cancer Half-Sister 60       recurrence at 63, reports positive genetic testing   Arthritis Half-Sister    Diabetes Half-Sister    Colon cancer Half-Sister 70   Allergies Other        grandson   Allergic rhinitis Neg Hx    Angioedema Neg Hx    Asthma Neg Hx    Atopy Neg Hx    Eczema Neg Hx    Immunodeficiency Neg Hx    Urticaria Neg Hx     PE: BP 120/74 (BP Location: Left Arm, Patient Position: Sitting, Cuff Size: Normal)   Pulse 91   Ht 5' 4.25" (1.632 m)   Wt 197 lb (89.4 kg)   SpO2 94%   BMI 33.55 kg/m  Wt Readings from Last 3 Encounters:  05/01/22 197 lb (89.4 kg)  05/01/22 195 lb 8 oz (88.7 kg)  04/25/22 190 lb (86.2 kg)   Constitutional: overweight, in NAD, walks with a cane Eyes:  EOMI, no exophthalmos ENT: no neck masses, no cervical lymphadenopathy Cardiovascular: RRR, No MRG Respiratory: CTA B Musculoskeletal: no deformities Skin:no rashes Neurological: no tremor with outstretched hands  ASSESSMENT: 1.  Graves' disease  2.  Left thyroid nodule  PLAN:  1. Patient with history of thyrotoxicosis consistent with Graves' disease.  She initially had fatigue, heat intolerance, insomnia, but no palpitations, anxiety, hyperdefecation.  A thyroid uptake and scan confirmed  Graves' disease.  TSI antibodies were elevated.  She was started on methimazole and we were able to titrate the dose down.  In 04/2020 we tried to stop methimazole but we had to restart this back a month later.  In 01/2021, we again tried to stop methimazole, but we had to restart it in 05/2021.  In 09/2021, she had an elevated TSH, at 5.81.  However, at last visit, a repeat set of TFTs on the same dose of methimazole was normal.  We did not change methimazole at that time.  She continues on 5 mg daily. -At last visit, she had no thyrotoxic signs or symptoms but had an approximately 20 pounds weight loss, which was intentional after she cut back on eating. At today's visit, she is +10 lbs since last visit -We will recheck her TFTs today and change the methimazole dose accordingly.  In case we need to taper this down, we need to do a very slow taper - I will see her back in 1 year, but with labs at 6 months  2.  Left thyroid nodule -This appears to be cold on her thyroid scan -The nodule was biopsied in 11/2017: Benign -Reviewed the results of her latest thyroid ultrasound from 2021: Nodule was stable in size and no follow-up was needed -She has no new neck compression symptoms.  At last visit she described occasional choking when foods get stuck in her throat but this was not new.  At this visit, she describes that she needs to chew the food well. -We will continue to monitor her clinically  Needs refills.  Philemon Kingdom, MD PhD [facility] Endocrinology

## 2022-05-01 NOTE — Assessment & Plan Note (Signed)
Chronic, secondary to diabetes minimally problematic for patient currently.

## 2022-05-01 NOTE — Assessment & Plan Note (Signed)
Chronic, well controlled on irbesartan 150 mg daily

## 2022-05-01 NOTE — Assessment & Plan Note (Signed)
Chronic, euvolemic in office today.  She does have some intermittent swelling in right lower leg following right total knee replacement.

## 2022-05-01 NOTE — Patient Instructions (Signed)
For now, continue on Methimazole 5 mg daily.  Please stop at the lab.  Please come back for a follow-up appointment in 1 year but for labs in 6 months.

## 2022-05-02 ENCOUNTER — Ambulatory Visit
Admission: RE | Admit: 2022-05-02 | Discharge: 2022-05-02 | Disposition: A | Discharge status: Home / Self Care | Payer: Medicare Other | Source: Ambulatory Visit | Attending: Radiation Oncology | Admitting: Radiation Oncology

## 2022-05-02 ENCOUNTER — Other Ambulatory Visit: Payer: Self-pay

## 2022-05-02 DIAGNOSIS — D0512 Intraductal carcinoma in situ of left breast: Secondary | ICD-10-CM

## 2022-05-02 DIAGNOSIS — Z510 Encounter for antineoplastic radiation therapy: Secondary | ICD-10-CM | POA: Diagnosis not present

## 2022-05-02 DIAGNOSIS — Z170 Estrogen receptor positive status [ER+]: Secondary | ICD-10-CM | POA: Diagnosis not present

## 2022-05-02 LAB — RAD ONC ARIA SESSION SUMMARY
Course Elapsed Days: 0
Plan Fractions Treated to Date: 1
Plan Prescribed Dose Per Fraction: 2.67 Gy
Plan Total Fractions Prescribed: 15
Plan Total Prescribed Dose: 40.05 Gy
Reference Point Dosage Given to Date: 2.67 Gy
Reference Point Session Dosage Given: 2.67 Gy
Session Number: 1

## 2022-05-02 LAB — TSH: TSH: 4.87 u[IU]/mL (ref 0.35–5.50)

## 2022-05-02 LAB — T3, FREE: T3, Free: 2.6 pg/mL (ref 2.3–4.2)

## 2022-05-02 LAB — T4, FREE: Free T4: 0.75 ng/dL (ref 0.60–1.60)

## 2022-05-02 MED ORDER — METHIMAZOLE 5 MG PO TABS: 5.0000 mg | ORAL_TABLET | ORAL | 3 refills | Status: DC

## 2022-05-03 ENCOUNTER — Encounter: Payer: Self-pay | Admitting: *Deleted

## 2022-05-03 ENCOUNTER — Other Ambulatory Visit: Payer: Self-pay

## 2022-05-03 ENCOUNTER — Ambulatory Visit
Admission: RE | Admit: 2022-05-03 | Discharge: 2022-05-03 | Disposition: A | Discharge status: Home / Self Care | Payer: Medicare Other | Source: Ambulatory Visit | Attending: Radiation Oncology | Admitting: Radiation Oncology

## 2022-05-03 DIAGNOSIS — Z170 Estrogen receptor positive status [ER+]: Secondary | ICD-10-CM | POA: Diagnosis not present

## 2022-05-03 DIAGNOSIS — Z510 Encounter for antineoplastic radiation therapy: Secondary | ICD-10-CM | POA: Diagnosis not present

## 2022-05-03 DIAGNOSIS — D0512 Intraductal carcinoma in situ of left breast: Secondary | ICD-10-CM | POA: Diagnosis not present

## 2022-05-03 LAB — RAD ONC ARIA SESSION SUMMARY
Course Elapsed Days: 1
Plan Fractions Treated to Date: 2
Plan Prescribed Dose Per Fraction: 2.67 Gy
Plan Total Fractions Prescribed: 15
Plan Total Prescribed Dose: 40.05 Gy
Reference Point Dosage Given to Date: 5.34 Gy
Reference Point Session Dosage Given: 2.67 Gy
Session Number: 2

## 2022-05-04 ENCOUNTER — Other Ambulatory Visit: Payer: Self-pay

## 2022-05-04 ENCOUNTER — Ambulatory Visit
Admission: RE | Admit: 2022-05-04 | Discharge: 2022-05-04 | Disposition: A | Discharge status: Home / Self Care | Payer: Medicare Other | Source: Ambulatory Visit | Attending: Radiation Oncology | Admitting: Radiation Oncology

## 2022-05-04 DIAGNOSIS — D0512 Intraductal carcinoma in situ of left breast: Secondary | ICD-10-CM | POA: Diagnosis not present

## 2022-05-04 DIAGNOSIS — Z510 Encounter for antineoplastic radiation therapy: Secondary | ICD-10-CM | POA: Diagnosis not present

## 2022-05-04 DIAGNOSIS — Z170 Estrogen receptor positive status [ER+]: Secondary | ICD-10-CM | POA: Diagnosis not present

## 2022-05-04 LAB — RAD ONC ARIA SESSION SUMMARY
Course Elapsed Days: 2
Plan Fractions Treated to Date: 3
Plan Prescribed Dose Per Fraction: 2.67 Gy
Plan Total Fractions Prescribed: 15
Plan Total Prescribed Dose: 40.05 Gy
Reference Point Dosage Given to Date: 8.01 Gy
Reference Point Session Dosage Given: 2.67 Gy
Session Number: 3

## 2022-05-07 ENCOUNTER — Ambulatory Visit: Payer: Self-pay

## 2022-05-07 ENCOUNTER — Other Ambulatory Visit: Payer: Self-pay

## 2022-05-07 ENCOUNTER — Ambulatory Visit
Admission: RE | Admit: 2022-05-07 | Discharge: 2022-05-07 | Disposition: A | Discharge status: Home / Self Care | Payer: Medicare Other | Source: Ambulatory Visit | Attending: Radiation Oncology | Admitting: Radiation Oncology

## 2022-05-07 DIAGNOSIS — Z510 Encounter for antineoplastic radiation therapy: Secondary | ICD-10-CM | POA: Diagnosis not present

## 2022-05-07 DIAGNOSIS — D0512 Intraductal carcinoma in situ of left breast: Secondary | ICD-10-CM | POA: Diagnosis not present

## 2022-05-07 DIAGNOSIS — Z170 Estrogen receptor positive status [ER+]: Secondary | ICD-10-CM | POA: Diagnosis not present

## 2022-05-07 LAB — RAD ONC ARIA SESSION SUMMARY
Course Elapsed Days: 5
Plan Fractions Treated to Date: 4
Plan Prescribed Dose Per Fraction: 2.67 Gy
Plan Total Fractions Prescribed: 15
Plan Total Prescribed Dose: 40.05 Gy
Reference Point Dosage Given to Date: 10.68 Gy
Reference Point Session Dosage Given: 2.67 Gy
Session Number: 4

## 2022-05-07 NOTE — Patient Outreach (Signed)
  Care Coordination   Follow Up Visit Note   05/07/2022 Name: [name] MRN: [id number] DOB: May 23, 1946  [name] is a 76 y.o. year old female who sees [name], MD for primary care. I spoke with  [name] by phone today.  What matters to the patients health and wellness today?  Patient reports being diagnosed with breast cancer in September 2023. She reports starting radiation 1 week ago. Denies any symptoms. Patient reports having good friend/ family support.  Patient reports having total knee replacement in April 2023 and states she is recovering well. Still uses cane for ambulation.  Patient reports having Cholecystectomy June 2023.  States she has recovered well. Patient expresses she is in good spirits.     Goals Addressed             This Visit's Progress    Patient stated: Management of health conditions       Care Coordination Interventions: Evaluation of current treatment plan related to Breast  cancer and patient's adherence to plan as established by provider Reviewed medications with patient and discussed importance of compliance Reviewed scheduled/upcoming provider appointments  Discussed plans with patient for ongoing care management follow up and provided patient with direct contact information for care management team  Active listening / Emotional support provided Discussed tips for managing cancer treatment:   - keep lines of communication open with your provider ( report new or ongoing symptoms as soon as possible).  -Maintain a healthy lifestyle ( eat lean protein, fresh fruits and vegetables, Whole grains and drink plenty of water to stay hydrated).   -Lean on your support system when necessary ( friends/ family).  - Consider joining cancer support group          SDOH assessments and interventions completed:  No     Care Coordination Interventions Activated:  Yes  Care Coordination Interventions:  Yes, provided   Follow up plan:  Follow up call scheduled for 05/31/22 at 1:30 pm    Encounter Outcome:  Pt. Visit Completed

## 2022-05-07 NOTE — Patient Instructions (Signed)
Visit Information  Thank you for taking time to visit with me today. Please don't hesitate to contact me if I can be of assistance to you.   Following are the goals we discussed today:   Goals Addressed             This Visit's Progress    Patient stated: Management of health conditions       Care Coordination Interventions: Evaluation of current treatment plan related to Breast  cancer and patient's adherence to plan as established by provider Reviewed medications with patient and discussed importance of compliance Reviewed scheduled/upcoming provider appointments  Discussed plans with patient for ongoing care management follow up and provided patient with direct contact information for care management team Discussed tips for managing cancer treatment:   - keep lines of communication open with your provider ( report new or ongoing symptoms as soon as possible).  -Maintain a healthy lifestyle ( eat lean protein, fresh fruits and vegetables, Whole grains and drink plenty of water to stay hydrated).   -Lean on your support system when necessary ( friends/ family).  - Consider joining cancer support group          Our next appointment is by telephone on 05/31/22 at 1:30 pm  Please call the care guide team at [phone] if you need to cancel or reschedule your appointment.   If you are experiencing a Mental Health or [place] or need someone to talk to, please call 1-800-273-TALK (toll free, 24 hour hotline)  Patient verbalizes understanding of instructions and care plan provided today and agrees to view in [place]. Active MyChart status and patient understanding of how to access instructions and care plan via MyChart confirmed with patient.     Quinn Plowman RN,BSN,CCM [facility] Coord[facility] [phone] direct line

## 2022-05-08 ENCOUNTER — Ambulatory Visit
Admission: RE | Admit: 2022-05-08 | Discharge: 2022-05-08 | Disposition: A | Discharge status: Home / Self Care | Payer: Medicare Other | Source: Ambulatory Visit | Attending: Radiation Oncology | Admitting: Radiation Oncology

## 2022-05-08 ENCOUNTER — Other Ambulatory Visit: Payer: Self-pay

## 2022-05-08 ENCOUNTER — Telehealth: Payer: Self-pay

## 2022-05-08 DIAGNOSIS — D0512 Intraductal carcinoma in situ of left breast: Secondary | ICD-10-CM | POA: Diagnosis not present

## 2022-05-08 DIAGNOSIS — Z170 Estrogen receptor positive status [ER+]: Secondary | ICD-10-CM | POA: Diagnosis not present

## 2022-05-08 DIAGNOSIS — Z510 Encounter for antineoplastic radiation therapy: Secondary | ICD-10-CM | POA: Diagnosis not present

## 2022-05-08 LAB — RAD ONC ARIA SESSION SUMMARY
Course Elapsed Days: 6
Plan Fractions Treated to Date: 5
Plan Prescribed Dose Per Fraction: 2.67 Gy
Plan Total Fractions Prescribed: 15
Plan Total Prescribed Dose: 40.05 Gy
Reference Point Dosage Given to Date: 13.35 Gy
Reference Point Session Dosage Given: 2.67 Gy
Session Number: 5

## 2022-05-08 MED ORDER — ALRA NON-METALLIC DEODORANT (RAD-ONC)
1.0000 | Freq: Once | TOPICAL | Status: AC
  Administered 2022-05-08: 1 via TOPICAL

## 2022-05-08 MED ORDER — SONAFINE EX EMUL
1.0000 | Freq: Once | CUTANEOUS | Status: AC
  Administered 2022-05-08: 1 via TOPICAL

## 2022-05-08 NOTE — Telephone Encounter (Signed)
Called Mrs. Abelson back as she had left me a message that she was confused about her transfer of care from Dr. Chryl Heck to Dr. Lindi Adie because she still had an appointment on the books with Dr. Chryl Heck on 10/10/2022.  I assured her that her appointment with Dr. Chryl Heck had been cancelled and she would be receiving care from Dr. Lindi Adie going forward. She understands and was happy for the call back. Gardiner Rhyme, RN

## 2022-05-09 ENCOUNTER — Other Ambulatory Visit: Payer: Self-pay

## 2022-05-09 ENCOUNTER — Ambulatory Visit
Admission: RE | Admit: 2022-05-09 | Discharge: 2022-05-09 | Disposition: A | Discharge status: Home / Self Care | Payer: Medicare Other | Source: Ambulatory Visit | Attending: Radiation Oncology | Admitting: Radiation Oncology

## 2022-05-09 DIAGNOSIS — Z170 Estrogen receptor positive status [ER+]: Secondary | ICD-10-CM | POA: Diagnosis not present

## 2022-05-09 DIAGNOSIS — D0512 Intraductal carcinoma in situ of left breast: Secondary | ICD-10-CM | POA: Diagnosis not present

## 2022-05-09 DIAGNOSIS — Z510 Encounter for antineoplastic radiation therapy: Secondary | ICD-10-CM | POA: Diagnosis not present

## 2022-05-09 LAB — RAD ONC ARIA SESSION SUMMARY
Course Elapsed Days: 7
Plan Fractions Treated to Date: 6
Plan Prescribed Dose Per Fraction: 2.67 Gy
Plan Total Fractions Prescribed: 15
Plan Total Prescribed Dose: 40.05 Gy
Reference Point Dosage Given to Date: 16.02 Gy
Reference Point Session Dosage Given: 2.67 Gy
Session Number: 6

## 2022-05-10 ENCOUNTER — Ambulatory Visit
Admission: RE | Admit: 2022-05-10 | Discharge: 2022-05-10 | Disposition: A | Discharge status: Home / Self Care | Payer: Medicare Other | Source: Ambulatory Visit | Attending: Radiation Oncology | Admitting: Radiation Oncology

## 2022-05-10 ENCOUNTER — Other Ambulatory Visit: Payer: Self-pay

## 2022-05-10 ENCOUNTER — Inpatient Hospital Stay: Payer: Medicare Other | Attending: Hematology and Oncology | Admitting: Dietician

## 2022-05-10 DIAGNOSIS — Z170 Estrogen receptor positive status [ER+]: Secondary | ICD-10-CM | POA: Diagnosis not present

## 2022-05-10 DIAGNOSIS — D0512 Intraductal carcinoma in situ of left breast: Secondary | ICD-10-CM | POA: Diagnosis not present

## 2022-05-10 DIAGNOSIS — Z510 Encounter for antineoplastic radiation therapy: Secondary | ICD-10-CM | POA: Diagnosis not present

## 2022-05-10 LAB — RAD ONC ARIA SESSION SUMMARY
Course Elapsed Days: 8
Plan Fractions Treated to Date: 7
Plan Prescribed Dose Per Fraction: 2.67 Gy
Plan Total Fractions Prescribed: 15
Plan Total Prescribed Dose: 40.05 Gy
Reference Point Dosage Given to Date: 18.69 Gy
Reference Point Session Dosage Given: 2.67 Gy
Session Number: 7

## 2022-05-10 NOTE — Progress Notes (Signed)
Patient attended Nutrition 101 class 05/10/22

## 2022-05-11 ENCOUNTER — Inpatient Hospital Stay: Payer: Medicare Other | Admitting: Licensed Clinical Social Worker

## 2022-05-11 ENCOUNTER — Other Ambulatory Visit: Payer: Self-pay

## 2022-05-11 ENCOUNTER — Ambulatory Visit
Admission: RE | Admit: 2022-05-11 | Discharge: 2022-05-11 | Disposition: A | Discharge status: Home / Self Care | Payer: Medicare Other | Source: Ambulatory Visit | Attending: Radiation Oncology | Admitting: Radiation Oncology

## 2022-05-11 ENCOUNTER — Ambulatory Visit (INDEPENDENT_AMBULATORY_CARE_PROVIDER_SITE_OTHER): Payer: Medicare Other | Admitting: Podiatry

## 2022-05-11 DIAGNOSIS — M79676 Pain in unspecified toe(s): Secondary | ICD-10-CM | POA: Diagnosis not present

## 2022-05-11 DIAGNOSIS — M2041 Other hammer toe(s) (acquired), right foot: Secondary | ICD-10-CM

## 2022-05-11 DIAGNOSIS — Q828 Other specified congenital malformations of skin: Secondary | ICD-10-CM | POA: Diagnosis not present

## 2022-05-11 DIAGNOSIS — M2042 Other hammer toe(s) (acquired), left foot: Secondary | ICD-10-CM

## 2022-05-11 DIAGNOSIS — E1142 Type 2 diabetes mellitus with diabetic polyneuropathy: Secondary | ICD-10-CM | POA: Diagnosis not present

## 2022-05-11 DIAGNOSIS — Z170 Estrogen receptor positive status [ER+]: Secondary | ICD-10-CM | POA: Diagnosis not present

## 2022-05-11 DIAGNOSIS — D0512 Intraductal carcinoma in situ of left breast: Secondary | ICD-10-CM | POA: Diagnosis not present

## 2022-05-11 DIAGNOSIS — L84 Corns and callosities: Secondary | ICD-10-CM

## 2022-05-11 DIAGNOSIS — B351 Tinea unguium: Secondary | ICD-10-CM | POA: Diagnosis not present

## 2022-05-11 DIAGNOSIS — Z510 Encounter for antineoplastic radiation therapy: Secondary | ICD-10-CM | POA: Diagnosis not present

## 2022-05-11 LAB — RAD ONC ARIA SESSION SUMMARY
Course Elapsed Days: 9
Plan Fractions Treated to Date: 8
Plan Prescribed Dose Per Fraction: 2.67 Gy
Plan Total Fractions Prescribed: 15
Plan Total Prescribed Dose: 40.05 Gy
Reference Point Dosage Given to Date: 21.36 Gy
Reference Point Session Dosage Given: 2.67 Gy
Session Number: 8

## 2022-05-11 NOTE — Progress Notes (Unsigned)
Subjective:  Patient ID: [name], female    DOB: [birth year],  MRN: [id number]  SONDOS WOLFMAN presents to clinic today for at risk diabetic foot care with h/o diabetic neuropathy. Chief Complaint  Patient presents with   Nail Problem    Thick painful toenails, 3 month follow up   . New problem(s): None.   She is currently undergoing treatment for breast cancer of left breast.  PCP is [name], MD , and last visit was  May 01, 2022.  Allergies  Allergen Reactions   Dilantin [Phenytoin] Swelling    facial swelling   Latex Hives   Oxycodone Nausea And Vomiting and Nausea Only    Abdominal Pain, Vomiting   Penicillins Nausea And Vomiting and Swelling    Has patient had a PCN reaction causing immediate rash, facial/tongue/throat swelling, SOB or lightheadedness with hypotension patient had a PCN reaction causing severe rash involving mucus membranes or skin necrosis: [id number]} Has patient had a PCN reaction that required hospitalization/No Has patient had a PCN reaction occurring within the last 10 years: No If all of the above answers are "NO", then may proceed with Cephalosporin use.      Codeine Nausea Only    Tolerates tylenol with codeine   Hydrocodone Nausea And Vomiting   Nickel Hives and Rash   Pregabalin Itching and Other (See Comments)    headaches/problem w/vision    Review of Systems: Negative except as noted in the HPI.  Objective: No changes noted in today's physical examination.  Karys Meckley Mooneyhan is a pleasant 76 y.o. female WD, WN in NAD. AAO x 3.  Neurovascular Examination: CFT <3 seconds b/l LE. Palpable DP/PT pulses b/l LE. Digital hair sparse b/l. Skin temperature gradient WNL b/l. No pain with calf compression b/l. No edema noted b/l. No cyanosis or clubbing noted b/l LE.  Pt has subjective symptoms of neuropathy. Protective sensation decreased with 10 gram monofilament b/l.  Dermatological:  Pedal integument with normal turgor,  texture and tone BLE. No open wounds b/l LE. No interdigital macerations noted b/l LE. Toenails 2-5 bilaterally elongated, discolored, dystrophic, thickened, and crumbly with subungual debris and tenderness to dorsal palpation.   Anonychia noted bilateral great toes. Nailbed(s) epithelialized.    Porokeratotic lesion(s) plantar aspect right heel and submet head 5 b/l.  No erythema, no edema, no drainage, no fluctuance.  Preulcerative lesion noted distal tip of left great toe. There is visible subdermal hemorrhage. There is no surrounding erythema, no edema, no drainage, no odor, no fluctuance.  Musculoskeletal:  Normal muscle strength 5/5 to all lower extremity muscle groups bilaterally. Hammertoe deformity noted 2-5 b/l.   Assessment/Plan: 1. Pain due to onychomycosis of toenail   2. Porokeratosis   3. Pre-ulcerative calluses   4. Acquired hammertoes of both feet   5. Diabetic polyneuropathy associated with type 2 diabetes mellitus ([place])     No orders of the defined types were placed in this encounter.   -Patient was evaluated and treated. All patient's and/or POA's questions/concerns answered on today's visit. -Discussed diabetic shoe benefit available based on patient's diagnoses. Patient/POA would like to proceed. Order entered for one pair extra depth shoes and 3 pair total contact insoles. Patient qualifies based on diagnoses. -Mycotic toenails 1-5 bilaterally were debrided in length and girth with sterile nail nippers and dremel without incident. -Preulcerative lesion pared distal tip of left great toe utilizing sterile scalpel blade. Total number pared=1. -Porokeratotic lesion(s) plantar heel pad of right foot and  submet head 5 b/l pared and enucleated with sterile currette without incident. Total number of lesions debrided=3. -Patient/POA to call should there be question/concern in the interim.   Return in about 3 months (around 08/11/2022).  Marzetta Board, DPM

## 2022-05-14 ENCOUNTER — Ambulatory Visit
Admission: RE | Admit: 2022-05-14 | Discharge: 2022-05-14 | Disposition: A | Discharge status: Home / Self Care | Payer: Medicare Other | Source: Ambulatory Visit | Attending: Radiation Oncology | Admitting: Radiation Oncology

## 2022-05-14 ENCOUNTER — Other Ambulatory Visit: Payer: Self-pay

## 2022-05-14 DIAGNOSIS — Z510 Encounter for antineoplastic radiation therapy: Secondary | ICD-10-CM | POA: Diagnosis not present

## 2022-05-14 DIAGNOSIS — Z170 Estrogen receptor positive status [ER+]: Secondary | ICD-10-CM | POA: Diagnosis not present

## 2022-05-14 DIAGNOSIS — D0512 Intraductal carcinoma in situ of left breast: Secondary | ICD-10-CM | POA: Diagnosis not present

## 2022-05-14 LAB — RAD ONC ARIA SESSION SUMMARY
Course Elapsed Days: 12
Plan Fractions Treated to Date: 9
Plan Prescribed Dose Per Fraction: 2.67 Gy
Plan Total Fractions Prescribed: 15
Plan Total Prescribed Dose: 40.05 Gy
Reference Point Dosage Given to Date: 24.03 Gy
Reference Point Session Dosage Given: 2.67 Gy
Session Number: 9

## 2022-05-15 ENCOUNTER — Ambulatory Visit: Payer: Medicare Other | Admitting: Radiation Oncology

## 2022-05-15 ENCOUNTER — Encounter: Payer: Self-pay | Admitting: Podiatry

## 2022-05-15 ENCOUNTER — Ambulatory Visit
Admission: RE | Admit: 2022-05-15 | Discharge: 2022-05-15 | Disposition: A | Discharge status: Home / Self Care | Payer: Medicare Other | Source: Ambulatory Visit | Attending: Radiation Oncology | Admitting: Radiation Oncology

## 2022-05-15 ENCOUNTER — Telehealth: Payer: Self-pay | Admitting: Family Medicine

## 2022-05-15 ENCOUNTER — Other Ambulatory Visit: Payer: Self-pay

## 2022-05-15 DIAGNOSIS — D0512 Intraductal carcinoma in situ of left breast: Secondary | ICD-10-CM | POA: Diagnosis not present

## 2022-05-15 DIAGNOSIS — Z170 Estrogen receptor positive status [ER+]: Secondary | ICD-10-CM | POA: Diagnosis not present

## 2022-05-15 DIAGNOSIS — Z510 Encounter for antineoplastic radiation therapy: Secondary | ICD-10-CM | POA: Diagnosis not present

## 2022-05-15 LAB — RAD ONC ARIA SESSION SUMMARY
Course Elapsed Days: 13
Plan Fractions Treated to Date: 10
Plan Prescribed Dose Per Fraction: 2.67 Gy
Plan Total Fractions Prescribed: 15
Plan Total Prescribed Dose: 40.05 Gy
Reference Point Dosage Given to Date: 26.7 Gy
Reference Point Session Dosage Given: 2.67 Gy
Session Number: 10

## 2022-05-15 NOTE — Telephone Encounter (Signed)
I spoke with pt; pt said she is not on any vitamins; pt has had 10 radiation treatments already and pt wonders if she should be taking a multiple vitamin, Vit B 12 or what does Dr Diona Browner suggest. Pt had medicare wellness appt with Dr Diona Browner on 05/01/22.pt request cb after Dr Diona Browner reviews note. Sending note to Dr Diona Browner and [name] CMA.

## 2022-05-15 NOTE — Telephone Encounter (Signed)
Patient called in and had some questions about some vitamins. She is having a cancer treatment tomorrow between 11-11:30 am. Thank you!

## 2022-05-16 ENCOUNTER — Ambulatory Visit
Admission: RE | Admit: 2022-05-16 | Discharge: 2022-05-16 | Disposition: A | Discharge status: Home / Self Care | Payer: Medicare Other | Source: Ambulatory Visit | Attending: Radiation Oncology | Admitting: Radiation Oncology

## 2022-05-16 ENCOUNTER — Other Ambulatory Visit: Payer: Self-pay

## 2022-05-16 DIAGNOSIS — D0512 Intraductal carcinoma in situ of left breast: Secondary | ICD-10-CM | POA: Diagnosis not present

## 2022-05-16 DIAGNOSIS — Z96652 Presence of left artificial knee joint: Secondary | ICD-10-CM | POA: Insufficient documentation

## 2022-05-16 DIAGNOSIS — Z510 Encounter for antineoplastic radiation therapy: Secondary | ICD-10-CM | POA: Diagnosis not present

## 2022-05-16 DIAGNOSIS — Z170 Estrogen receptor positive status [ER+]: Secondary | ICD-10-CM | POA: Diagnosis not present

## 2022-05-16 LAB — RAD ONC ARIA SESSION SUMMARY
Course Elapsed Days: 14
Plan Fractions Treated to Date: 11
Plan Prescribed Dose Per Fraction: 2.67 Gy
Plan Total Fractions Prescribed: 15
Plan Total Prescribed Dose: 40.05 Gy
Reference Point Dosage Given to Date: 29.37 Gy
Reference Point Session Dosage Given: 2.67 Gy
Session Number: 11

## 2022-05-16 NOTE — Telephone Encounter (Signed)
Left a message on voicemail for patient to call the office back. 

## 2022-05-16 NOTE — Telephone Encounter (Signed)
[name] notified by telephone that Dr. Diona Browner recommends she take a multivitamin, B12 and Vit D supplement.  Patient states understanding.

## 2022-05-16 NOTE — Telephone Encounter (Signed)
She can take a multivitamin, vitamin D supplement and B12.

## 2022-05-17 ENCOUNTER — Ambulatory Visit
Admission: RE | Admit: 2022-05-17 | Discharge: 2022-05-17 | Disposition: A | Discharge status: Home / Self Care | Payer: Medicare Other | Source: Ambulatory Visit | Attending: Radiation Oncology | Admitting: Radiation Oncology

## 2022-05-17 ENCOUNTER — Other Ambulatory Visit: Payer: Self-pay

## 2022-05-17 DIAGNOSIS — Z96652 Presence of left artificial knee joint: Secondary | ICD-10-CM | POA: Diagnosis not present

## 2022-05-17 DIAGNOSIS — Z170 Estrogen receptor positive status [ER+]: Secondary | ICD-10-CM | POA: Diagnosis not present

## 2022-05-17 DIAGNOSIS — D0512 Intraductal carcinoma in situ of left breast: Secondary | ICD-10-CM | POA: Diagnosis not present

## 2022-05-17 DIAGNOSIS — Z510 Encounter for antineoplastic radiation therapy: Secondary | ICD-10-CM | POA: Diagnosis not present

## 2022-05-17 LAB — RAD ONC ARIA SESSION SUMMARY
Course Elapsed Days: 15
Plan Fractions Treated to Date: 12
Plan Prescribed Dose Per Fraction: 2.67 Gy
Plan Total Fractions Prescribed: 15
Plan Total Prescribed Dose: 40.05 Gy
Reference Point Dosage Given to Date: 32.04 Gy
Reference Point Session Dosage Given: 2.67 Gy
Session Number: 12

## 2022-05-18 ENCOUNTER — Ambulatory Visit
Admission: RE | Admit: 2022-05-18 | Discharge: 2022-05-18 | Disposition: A | Discharge status: Home / Self Care | Payer: Medicare Other | Source: Ambulatory Visit | Attending: Radiation Oncology | Admitting: Radiation Oncology

## 2022-05-18 ENCOUNTER — Other Ambulatory Visit: Payer: Self-pay

## 2022-05-18 ENCOUNTER — Telehealth: Payer: Self-pay | Admitting: Family Medicine

## 2022-05-18 ENCOUNTER — Telehealth: Payer: Self-pay | Admitting: Oncology

## 2022-05-18 DIAGNOSIS — Z510 Encounter for antineoplastic radiation therapy: Secondary | ICD-10-CM | POA: Diagnosis not present

## 2022-05-18 DIAGNOSIS — Z96652 Presence of left artificial knee joint: Secondary | ICD-10-CM | POA: Diagnosis not present

## 2022-05-18 DIAGNOSIS — Z170 Estrogen receptor positive status [ER+]: Secondary | ICD-10-CM | POA: Diagnosis not present

## 2022-05-18 DIAGNOSIS — D0512 Intraductal carcinoma in situ of left breast: Secondary | ICD-10-CM | POA: Diagnosis not present

## 2022-05-18 LAB — RAD ONC ARIA SESSION SUMMARY
Course Elapsed Days: 16
Plan Fractions Treated to Date: 13
Plan Prescribed Dose Per Fraction: 2.67 Gy
Plan Total Fractions Prescribed: 15
Plan Total Prescribed Dose: 40.05 Gy
Reference Point Dosage Given to Date: 34.71 Gy
Reference Point Session Dosage Given: 2.67 Gy
Session Number: 13

## 2022-05-18 NOTE — Telephone Encounter (Signed)
Looks like [name] has also call her General Surgeons office and Radiation Oncologist office with same complaint.  See phone note to those offices from today.

## 2022-05-18 NOTE — Telephone Encounter (Signed)
Reviewed notes from surgery and radiation onc.  Agree but would also recommend trying Benadryl at night and Claritin during the day. Make appt to be seen if not improving by next week.

## 2022-05-18 NOTE — Telephone Encounter (Signed)
Pt called requesting to speak to [name] about being prescribed some meds for the itching she's experiencing all over he body? Pt stated the itching occurs all at different times. Call back # [phone]

## 2022-05-18 NOTE — Telephone Encounter (Signed)
[name] called and said she has started itching over her whole body.  She denies seeing a rash and has not started using any new lotions or soaps. Advised her that she can try using a small amount of hydrocortisone cream on the areas that are bothering her the most.  She is also going to reach out to her primary care doctor.

## 2022-05-18 NOTE — Telephone Encounter (Signed)
Ms. Keckler notified as instructed by telephone.  While on the phone patient states she is having back pain and has been taking 2 extra strength Tylenol every 6 hours but that is not helping.  She still has some Tylenol #3 but hasn't taken that since her Knee surgery.  I spoke with Dr. Diona Browner who states it would be okay for her to take the Tylenol #3 for her back pain.  I advised patient to follow up with [place] early next week if these recommendations don't help.  Patient states understanding.

## 2022-05-21 ENCOUNTER — Other Ambulatory Visit: Payer: Self-pay

## 2022-05-21 ENCOUNTER — Ambulatory Visit
Admission: RE | Admit: 2022-05-21 | Discharge: 2022-05-21 | Disposition: A | Discharge status: Home / Self Care | Payer: Medicare Other | Source: Ambulatory Visit | Attending: Radiation Oncology | Admitting: Radiation Oncology

## 2022-05-21 DIAGNOSIS — Z170 Estrogen receptor positive status [ER+]: Secondary | ICD-10-CM | POA: Diagnosis not present

## 2022-05-21 DIAGNOSIS — D0512 Intraductal carcinoma in situ of left breast: Secondary | ICD-10-CM | POA: Diagnosis not present

## 2022-05-21 DIAGNOSIS — Z510 Encounter for antineoplastic radiation therapy: Secondary | ICD-10-CM | POA: Diagnosis not present

## 2022-05-21 DIAGNOSIS — Z96652 Presence of left artificial knee joint: Secondary | ICD-10-CM | POA: Diagnosis not present

## 2022-05-21 LAB — RAD ONC ARIA SESSION SUMMARY
Course Elapsed Days: 19
Plan Fractions Treated to Date: 14
Plan Prescribed Dose Per Fraction: 2.67 Gy
Plan Total Fractions Prescribed: 15
Plan Total Prescribed Dose: 40.05 Gy
Reference Point Dosage Given to Date: 37.38 Gy
Reference Point Session Dosage Given: 2.67 Gy
Session Number: 14

## 2022-05-22 ENCOUNTER — Ambulatory Visit (INDEPENDENT_AMBULATORY_CARE_PROVIDER_SITE_OTHER): Payer: Medicare Other | Admitting: Family Medicine

## 2022-05-22 ENCOUNTER — Ambulatory Visit: Payer: Medicare Other | Admitting: Podiatry

## 2022-05-22 ENCOUNTER — Ambulatory Visit
Admission: RE | Admit: 2022-05-22 | Discharge: 2022-05-22 | Disposition: A | Discharge status: Home / Self Care | Payer: Medicare Other | Source: Ambulatory Visit | Attending: Radiation Oncology | Admitting: Radiation Oncology

## 2022-05-22 ENCOUNTER — Telehealth: Payer: Self-pay | Admitting: *Deleted

## 2022-05-22 ENCOUNTER — Encounter: Payer: Self-pay | Admitting: Family Medicine

## 2022-05-22 ENCOUNTER — Other Ambulatory Visit: Payer: Self-pay

## 2022-05-22 VITALS — BP 106/60 | HR 76 | Temp 98.2°F | Ht 64.25 in | Wt 196.1 lb

## 2022-05-22 DIAGNOSIS — K148 Other diseases of tongue: Secondary | ICD-10-CM | POA: Diagnosis not present

## 2022-05-22 DIAGNOSIS — Z96652 Presence of left artificial knee joint: Secondary | ICD-10-CM

## 2022-05-22 DIAGNOSIS — S93401A Sprain of unspecified ligament of right ankle, initial encounter: Secondary | ICD-10-CM | POA: Diagnosis not present

## 2022-05-22 DIAGNOSIS — D0512 Intraductal carcinoma in situ of left breast: Secondary | ICD-10-CM | POA: Diagnosis not present

## 2022-05-22 DIAGNOSIS — Z510 Encounter for antineoplastic radiation therapy: Secondary | ICD-10-CM | POA: Diagnosis not present

## 2022-05-22 DIAGNOSIS — Z170 Estrogen receptor positive status [ER+]: Secondary | ICD-10-CM | POA: Diagnosis not present

## 2022-05-22 LAB — RAD ONC ARIA SESSION SUMMARY
Course Elapsed Days: 20
Plan Fractions Treated to Date: 15
Plan Prescribed Dose Per Fraction: 2.67 Gy
Plan Total Fractions Prescribed: 15
Plan Total Prescribed Dose: 40.05 Gy
Reference Point Dosage Given to Date: 40.05 Gy
Reference Point Session Dosage Given: 2.67 Gy
Session Number: 15

## 2022-05-22 MED ORDER — ACETAMINOPHEN-CODEINE 300-30 MG PO TABS: 1.0000 | ORAL_TABLET | Freq: Four times a day (QID) | ORAL | 0 refills | Status: DC | PRN

## 2022-05-22 NOTE — Progress Notes (Signed)
Patient ID: [name], female    DOB: 08/02/1945, 76 y.o.   MRN: [id number]  This visit was conducted in person.  BP 106/60   Pulse 76   Temp 98.2 F (36.8 C) (Oral)   Ht 5' 4.25" (1.632 m)   Wt 196 lb 2 oz (89 kg)   SpO2 98%   BMI 33.40 kg/m    CC:  Chief Complaint  Patient presents with   Ankle Pain    Right   Sore on Tongue    Has been bleeding    Subjective:   HPI: [name] is a 76 y.o. female presenting on 05/22/2022 for Ankle Pain (Right) and Sore on Tongue (Has been bleeding)  Currently having radiation.  Sore on tongue, bleeding occ.  Noted 2-3 days ago Has dry mouth.. using biotin.  New onset pain in right ankle.  No new injury, no fall. Lateral over malleolus., mild swelling , no redness or rash    Has [place] for DVT tommorow.  Relevant past medical, surgical, family and social history reviewed and updated as indicated. Interim medical history since our last visit reviewed.  Allergies and medications reviewed and updated. Outpatient Medications Prior to Visit  Medication Sig Dispense Refill   acetaminophen (TYLENOL) 500 MG tablet Take 500-1,000 mg by mouth every 6 (six) hours as needed for moderate pain.     acetaminophen-codeine (TYLENOL #3) 300-30 MG tablet Take 1-2 tablets by mouth every 6 (six) hours as needed for moderate pain.     ADVAIR HFA 825-05 MCG/ACT inhaler INHALE 2 PUFFS BY MOUTH TWICE DAILY TO PREVENT COUGH OR WHEEZING. RINSE MOUTH AFTER USE 12 g 2   albuterol (VENTOLIN HFA) 108 (90 Base) MCG/ACT inhaler INHALE 2 PUFFS BY MOUTH EVERY 6 HOURS AS NEEDED FOR WHEEZING OR SHORTNESS OF BREATH 18 g 1   Artificial Saliva (BIOTENE DRY MOUTH) LOZG Use as directed 1 lozenge in the mouth or throat daily as needed (dry mouth).     atorvastatin (LIPITOR) 10 MG tablet TAKE 1 TABLET(10 MG) BY MOUTH DAILY 90 tablet 3   Blood Glucose Monitoring Suppl (ONETOUCH VERIO) w/Device KIT Use to check blood sugar up to 2 times a day 1 kit 0   Coenzyme Q10  (COQ10) 100 MG CAPS Take 100 mg by mouth daily.     cyclobenzaprine (FLEXERIL) 10 MG tablet Take 0.5-1 tablets (5-10 mg total) by mouth at bedtime as needed for muscle spasms. 20 tablet 0   Empagliflozin-linaGLIPtin (GLYXAMBI) 10-5 MG TABS Take 1 tablet by mouth daily. 30 tablet 5   EPINEPHrine 0.3 mg/0.3 mL IJ SOAJ injection USE AS DIRECTED FOR LIFE THREATENING ALLERGIC REACTIONS 2 each 2   estradiol (ESTRACE) 0.1 MG/GM vaginal cream Place 1 Applicatorful vaginally 2 (two) times a week. (As Needed)     famotidine (PEPCID) 40 MG tablet Take 1 (ONE) tablet by mouth every evening. 90 tablet 1   fluticasone (FLOVENT HFA) 220 MCG/ACT inhaler Inhale 2 puffs into the lungs 2 (two) times daily as needed (shortness of breath).     gabapentin (NEURONTIN) 600 MG tablet TAKE 1 TABLET BY MOUTH EVERY DAY AT BREAKFAST, 1 TABLET AT LUNCH AND 2 TABLETS AT BEDTIME 360 tablet 1   glucose blood (ONETOUCH VERIO) test strip 1 strip 2 (two) times daily     hydrOXYzine (ATARAX/VISTARIL) 10 MG tablet Take 1 tablet (10 mg total) by mouth daily as needed for itching. 30 tablet 2   ipratropium (ATROVENT) 0.06 % nasal spray  1-2 sprays in each nostril 1-2 times per day as needed to dry nose 15 mL 5   irbesartan (AVAPRO) 150 MG tablet Take 1 tablet (150 mg total) by mouth daily. 90 tablet 2   Lactase (DAIRY-RELIEF PO) Take 1 capsule by mouth daily as needed (eating dairy).      Lancets (ONETOUCH DELICA PLUS [id number]) MISC USE TO CHECK BLOOD SUGAR UP TO TWICE DAILY 100 each 5   levalbuterol (XOPENEX) 1.25 MG/3ML nebulizer solution USE 1 VIAL VIA NEBULIZER EVERY 6 HOURS AS NEEDED FOR SHORTNESS OF BREATH OR WHEEZING 90 mL 3   liver oil-zinc oxide (DESITIN) 40 % ointment Apply 1 application topically as needed for irritation.     MAGNESIUM GLYCINATE PO Take 2 tablets by mouth 2 (two) times daily.     Menthol, Topical Analgesic, (ABSORBINE PLUS JR EX) Apply 1 patch topically daily as needed (pain).     Menthol-Methyl Salicylate  (SALONPAS JET SPRAY EX) Apply 1 spray topically daily as needed (knee pain).     METAMUCIL FIBER PO Take 1 Dose by mouth daily. 1 dose = 1/2 tablespoon     methimazole (TAPAZOLE) 5 MG tablet Take 1 tablet (5 mg total) by mouth every other day. 45 tablet 3   montelukast (SINGULAIR) 10 MG tablet Take 1 tablet (10 mg total) by mouth at bedtime. For asthma control. 90 tablet 1   Mouthwashes (BIOTENE DRY MOUTH MT) Use as directed 1 Dose in the mouth or throat daily as needed (dry mouth).     nystatin (MYCOSTATIN) 100000 UNIT/ML suspension 75m swish and swallow after Advair 2 times daily. 473 mL 5   ondansetron (ZOFRAN-ODT) 8 MG disintegrating tablet Take 1 tablet (8 mg total) by mouth every 8 (eight) hours as needed for nausea or vomiting. 20 tablet 0   ONETOUCH VERIO test strip USE TO CHECK BLOOD SUGAR UP TO TWICE DAILY AS DIRECTED 100 strip 5   oxybutynin (DITROPAN-XL) 10 MG 24 hr tablet Take 10 mg by mouth at bedtime.     pantoprazole (PROTONIX) 40 MG tablet Take 1 (ONE) tablet by mouth every morning. 90 tablet 1   Plecanatide (TRULANCE) 3 MG TABS Take 3 mg by mouth daily as needed (constipation).     Polyethyl Glycol-Propyl Glycol (SYSTANE OP) Place 1 drop into both eyes daily as needed (dry eyes).     polyethylene glycol powder (GLYCOLAX/MIRALAX) 17 GM/SCOOP powder Take 1 Container by mouth once.     PRESCRIPTION MEDICATION Inhale into the lungs at bedtime. CPAP     Tiotropium Bromide Monohydrate (SPIRIVA RESPIMAT) 1.25 MCG/ACT AERS Inhale 2 each into the lungs daily.     tolterodine (DETROL LA) 4 MG 24 hr capsule Take 4 mg by mouth at bedtime.     traMADol (ULTRAM) 50 MG tablet Take 1-2 tablets (50-100 mg total) by mouth every 6 (six) hours as needed for moderate pain or severe pain (50 mg for moderate, 100 mg for severe). 20 tablet 0   traMADol (ULTRAM) 50 MG tablet Take 1 tablet (50 mg total) by mouth every 6 (six) hours as needed. 20 tablet 0   XARELTO 20 MG TABS tablet TAKE 1 TABLET(20 MG) BY  MOUTH DAILY WITH SUPPER 90 tablet 1   Facility-Administered Medications Prior to Visit  Medication Dose Route Frequency Provider Last Rate Last Admin   omalizumab (Arvid Right injection 300 mg  300 mg Subcutaneous Q28 days KJiles Prows MD   300 mg at 05/01/22 1054     Per HPI  unless specifically indicated in ROS section below Review of Systems  Constitutional:  Negative for fatigue and fever.  HENT:  Negative for congestion.   Eyes:  Negative for pain.  Respiratory:  Negative for cough and shortness of breath.   Cardiovascular:  Negative for chest pain, palpitations and leg swelling.  Gastrointestinal:  Negative for abdominal pain.  Genitourinary:  Negative for dysuria and vaginal bleeding.  Musculoskeletal:  Negative for back pain.  Neurological:  Negative for syncope, light-headedness and headaches.  Psychiatric/Behavioral:  Negative for dysphoric mood.    Objective:  BP 106/60   Pulse 76   Temp 98.2 F (36.8 C) (Oral)   Ht 5' 4.25" (1.632 m)   Wt 196 lb 2 oz (89 kg)   SpO2 98%   BMI 33.40 kg/m   Wt Readings from Last 3 Encounters:  05/22/22 196 lb 2 oz (89 kg)  05/01/22 197 lb (89.4 kg)  05/01/22 195 lb 8 oz (88.7 kg)      Physical Exam Constitutional:      General: She is not in acute distress.    Appearance: Normal appearance. She is well-developed. She is not ill-appearing or toxic-appearing.  HENT:     Head: Normocephalic.     Right Ear: Hearing, tympanic membrane, ear canal and external ear normal. Tympanic membrane is not erythematous, retracted or bulging.     Left Ear: Hearing, tympanic membrane, ear canal and external ear normal. Tympanic membrane is not erythematous, retracted or bulging.     Nose: No mucosal edema or rhinorrhea.     Right Sinus: No maxillary sinus tenderness or frontal sinus tenderness.     Left Sinus: No maxillary sinus tenderness or frontal sinus tenderness.     Mouth/Throat:     Pharynx: Uvula midline.  Eyes:     General: Lids are  normal. Lids are everted, no foreign bodies appreciated.     Conjunctiva/sclera: Conjunctivae normal.     Pupils: Pupils are equal, round, and reactive to light.  Neck:     Thyroid: No thyroid mass or thyromegaly.     Vascular: No carotid bruit.     Trachea: Trachea normal.  Cardiovascular:     Rate and Rhythm: Normal rate and regular rhythm.     Pulses: Normal pulses.     Heart sounds: Normal heart sounds, S1 normal and S2 normal. No murmur heard.    No friction rub. No gallop.  Pulmonary:     Effort: Pulmonary effort is normal. No tachypnea or respiratory distress.     Breath sounds: Normal breath sounds. No decreased breath sounds, wheezing, rhonchi or rales.  Abdominal:     General: Bowel sounds are normal.     Palpations: Abdomen is soft.     Tenderness: There is no abdominal tenderness.  Musculoskeletal:     Cervical back: Normal range of motion and neck supple.     Right ankle: Swelling present. No ecchymosis. Tenderness present over the lateral malleolus. No medial malleolus, ATF ligament, AITF ligament or CF ligament tenderness. Decreased range of motion.  Skin:    General: Skin is warm and dry.     Findings: No rash.  Neurological:     Mental Status: She is alert.  Psychiatric:        Mood and Affect: Mood is not anxious or depressed.        Speech: Speech normal.        Behavior: Behavior normal. Behavior is cooperative.  Thought Content: Thought content normal.        Judgment: Judgment normal.       Results for orders placed or performed in visit on 05/22/22  Rad Onc [facility] Session Summary  Result Value Ref Range   Course ID C1_Breast    Course Start Date 04/25/2022    Session Number 15    Course First Treatment Date 05/02/2022  1:36 PM    Course Last Treatment Date 05/22/2022 11:24 AM    Course Elapsed Days 20    Reference Point ID Breast_L DP    Reference Point Dosage Given to Date 40.05 Gy   Reference Point Session Dosage Given 2.67 Gy   Plan ID  Breast_Lt    Plan Name [email or web address]    Plan Fractions Treated to Date 15    Plan Total Fractions Prescribed 15    Plan Prescribed Dose Per Fraction 2.67 Gy   Plan Total Prescribed Dose 40.050000 Gy   Plan Primary Reference Point Breast_L DP    *Note: Due to a large number of results and/or encounters for the requested time period, some results have not been displayed. A complete set of results can be found in Results Review.     COVID 19 screen:  No recent travel or known exposure to COVID19 The patient denies respiratory symptoms of COVID 19 at this time. The importance of social distancing was discussed today.   Assessment and Plan Problem List Items Addressed This Visit     Lesion of tongue    Continue bitoin mouth wash.      Sprain of right ankle - Primary    ICe, elevation and brace. No indication for x-ray.       Refill chronic medication for pain. Stable control.. Using prn. No red flags. Meds ordered this encounter  Medications   acetaminophen-codeine (TYLENOL #3) 300-30 MG tablet    Sig: Take 1-2 tablets by mouth every 6 (six) hours as needed for moderate pain.    Dispense:  30 tablet    Refill:  0       [name], MD

## 2022-05-22 NOTE — Patient Instructions (Signed)
Start Biotene for dry mouth.  Stop nystatin.  Water swish after inhaler.

## 2022-05-22 NOTE — Telephone Encounter (Signed)
CALLED PATIENT TO INFORM OF DOPPLER FOR 05-23-22- ARRIVAL TIME- 9:45 AM @ [facility], NO RESTRICTIONS TO TEST, LVM FOR A RETURN CALL

## 2022-05-23 ENCOUNTER — Ambulatory Visit
Admission: RE | Admit: 2022-05-23 | Discharge: 2022-05-23 | Disposition: A | Discharge status: Home / Self Care | Payer: Medicare Other | Source: Ambulatory Visit | Attending: Radiation Oncology | Admitting: Radiation Oncology

## 2022-05-23 ENCOUNTER — Telehealth: Payer: Self-pay | Admitting: Oncology

## 2022-05-23 ENCOUNTER — Telehealth: Payer: Self-pay | Admitting: *Deleted

## 2022-05-23 ENCOUNTER — Other Ambulatory Visit: Payer: Self-pay

## 2022-05-23 ENCOUNTER — Ambulatory Visit (HOSPITAL_COMMUNITY)
Admission: RE | Admit: 2022-05-23 | Discharge: 2022-05-23 | Disposition: A | Discharge status: Home / Self Care | Payer: Medicare Other | Source: Ambulatory Visit | Attending: Radiation Oncology | Admitting: Radiation Oncology

## 2022-05-23 DIAGNOSIS — Z96652 Presence of left artificial knee joint: Secondary | ICD-10-CM | POA: Diagnosis not present

## 2022-05-23 DIAGNOSIS — Z510 Encounter for antineoplastic radiation therapy: Secondary | ICD-10-CM | POA: Diagnosis not present

## 2022-05-23 DIAGNOSIS — D0512 Intraductal carcinoma in situ of left breast: Secondary | ICD-10-CM | POA: Diagnosis not present

## 2022-05-23 DIAGNOSIS — Z170 Estrogen receptor positive status [ER+]: Secondary | ICD-10-CM | POA: Diagnosis not present

## 2022-05-23 LAB — RAD ONC ARIA SESSION SUMMARY
Course Elapsed Days: 21
Plan Fractions Treated to Date: 1
Plan Prescribed Dose Per Fraction: 2 Gy
Plan Total Fractions Prescribed: 6
Plan Total Prescribed Dose: 12 Gy
Reference Point Dosage Given to Date: 2 Gy
Reference Point Session Dosage Given: 2 Gy
Session Number: 16

## 2022-05-23 NOTE — Telephone Encounter (Signed)
Left a message advising that Dr. Sondra Come is recommending that she go through her primary care doctor for an orthopedic referral for her ankle.

## 2022-05-23 NOTE — Telephone Encounter (Signed)
RETURNED PATIENT'S PHONE CALL, SPOKE WITH PATIENT. ?

## 2022-05-23 NOTE — Progress Notes (Signed)
Right lower extremity venous duplex has been completed. Preliminary results can be found in CV Proc through chart review.  Results were given to Santiago Glad at Dr. Clabe Seal office.  05/23/22 9:34 AM Carlos Levering RVT

## 2022-05-23 NOTE — Telephone Encounter (Signed)
Received report from [facility] in Vascular Ultrasound.  Jaime's right lower extremity venous doppler is negative for DVT.

## 2022-05-24 ENCOUNTER — Ambulatory Visit
Admission: RE | Admit: 2022-05-24 | Discharge: 2022-05-24 | Disposition: A | Discharge status: Home / Self Care | Payer: Medicare Other | Source: Ambulatory Visit | Attending: Radiation Oncology | Admitting: Radiation Oncology

## 2022-05-24 ENCOUNTER — Other Ambulatory Visit: Payer: Self-pay

## 2022-05-24 DIAGNOSIS — Z96652 Presence of left artificial knee joint: Secondary | ICD-10-CM | POA: Diagnosis not present

## 2022-05-24 DIAGNOSIS — D0512 Intraductal carcinoma in situ of left breast: Secondary | ICD-10-CM | POA: Diagnosis not present

## 2022-05-24 DIAGNOSIS — Z510 Encounter for antineoplastic radiation therapy: Secondary | ICD-10-CM | POA: Diagnosis not present

## 2022-05-24 LAB — RAD ONC ARIA SESSION SUMMARY
Course Elapsed Days: 22
Plan Fractions Treated to Date: 2
Plan Prescribed Dose Per Fraction: 2 Gy
Plan Total Fractions Prescribed: 6
Plan Total Prescribed Dose: 12 Gy
Reference Point Dosage Given to Date: 4 Gy
Reference Point Session Dosage Given: 2 Gy
Session Number: 17

## 2022-05-25 ENCOUNTER — Other Ambulatory Visit: Payer: Self-pay

## 2022-05-25 ENCOUNTER — Ambulatory Visit
Admission: RE | Admit: 2022-05-25 | Discharge: 2022-05-25 | Disposition: A | Discharge status: Home / Self Care | Payer: Medicare Other | Source: Ambulatory Visit | Attending: Radiation Oncology | Admitting: Radiation Oncology

## 2022-05-25 DIAGNOSIS — Z96652 Presence of left artificial knee joint: Secondary | ICD-10-CM | POA: Diagnosis not present

## 2022-05-25 DIAGNOSIS — D0512 Intraductal carcinoma in situ of left breast: Secondary | ICD-10-CM | POA: Diagnosis not present

## 2022-05-25 DIAGNOSIS — Z510 Encounter for antineoplastic radiation therapy: Secondary | ICD-10-CM | POA: Diagnosis not present

## 2022-05-25 LAB — RAD ONC ARIA SESSION SUMMARY
Course Elapsed Days: 23
Plan Fractions Treated to Date: 3
Plan Prescribed Dose Per Fraction: 2 Gy
Plan Total Fractions Prescribed: 6
Plan Total Prescribed Dose: 12 Gy
Reference Point Dosage Given to Date: 6 Gy
Reference Point Session Dosage Given: 2 Gy
Session Number: 18

## 2022-05-25 NOTE — Progress Notes (Signed)
Added to wait list.

## 2022-05-28 ENCOUNTER — Inpatient Hospital Stay: Payer: Medicare Other | Attending: Hematology and Oncology

## 2022-05-28 ENCOUNTER — Other Ambulatory Visit: Payer: Self-pay

## 2022-05-28 ENCOUNTER — Ambulatory Visit
Admission: RE | Admit: 2022-05-28 | Discharge: 2022-05-28 | Disposition: A | Discharge status: Home / Self Care | Payer: Medicare Other | Source: Ambulatory Visit | Attending: Radiation Oncology | Admitting: Radiation Oncology

## 2022-05-28 DIAGNOSIS — Z510 Encounter for antineoplastic radiation therapy: Secondary | ICD-10-CM | POA: Diagnosis not present

## 2022-05-28 DIAGNOSIS — Z96652 Presence of left artificial knee joint: Secondary | ICD-10-CM | POA: Diagnosis not present

## 2022-05-28 DIAGNOSIS — J4550 Severe persistent asthma, uncomplicated: Secondary | ICD-10-CM | POA: Diagnosis not present

## 2022-05-28 DIAGNOSIS — D0512 Intraductal carcinoma in situ of left breast: Secondary | ICD-10-CM

## 2022-05-28 LAB — RAD ONC ARIA SESSION SUMMARY
Course Elapsed Days: 26
Plan Fractions Treated to Date: 4
Plan Prescribed Dose Per Fraction: 2 Gy
Plan Total Fractions Prescribed: 6
Plan Total Prescribed Dose: 12 Gy
Reference Point Dosage Given to Date: 8 Gy
Reference Point Session Dosage Given: 2 Gy
Session Number: 19

## 2022-05-28 MED ORDER — SONAFINE EX EMUL
1.0000 | Freq: Once | CUTANEOUS | Status: AC
  Administered 2022-05-28: 1 via TOPICAL

## 2022-05-28 NOTE — Progress Notes (Signed)
[place] Social Work  Patient presented to [facility] to review and complete healthcare advance directives.  Clinical Social Worker met with patient.  The patient wanted to delay completing her Advance Directives because she wanted to  think about some of her decisions more.  She agreed to have the completed information entered into her chart under goals of care.  Clinical Social Worker encouraged patient/family to contact with any additional questions or concerns.   Margaree Mackintosh, LCSW Clinical Social Worker [facility]

## 2022-05-29 ENCOUNTER — Telehealth: Payer: Self-pay | Admitting: Oncology

## 2022-05-29 ENCOUNTER — Ambulatory Visit
Admission: RE | Admit: 2022-05-29 | Discharge: 2022-05-29 | Disposition: A | Discharge status: Home / Self Care | Payer: Medicare Other | Source: Ambulatory Visit | Attending: Radiation Oncology | Admitting: Radiation Oncology

## 2022-05-29 ENCOUNTER — Other Ambulatory Visit: Payer: Self-pay

## 2022-05-29 ENCOUNTER — Ambulatory Visit (INDEPENDENT_AMBULATORY_CARE_PROVIDER_SITE_OTHER): Payer: Medicare Other

## 2022-05-29 DIAGNOSIS — D0512 Intraductal carcinoma in situ of left breast: Secondary | ICD-10-CM | POA: Diagnosis not present

## 2022-05-29 DIAGNOSIS — J4550 Severe persistent asthma, uncomplicated: Secondary | ICD-10-CM

## 2022-05-29 DIAGNOSIS — Z510 Encounter for antineoplastic radiation therapy: Secondary | ICD-10-CM | POA: Diagnosis not present

## 2022-05-29 DIAGNOSIS — Z170 Estrogen receptor positive status [ER+]: Secondary | ICD-10-CM | POA: Diagnosis not present

## 2022-05-29 DIAGNOSIS — Z96652 Presence of left artificial knee joint: Secondary | ICD-10-CM | POA: Diagnosis not present

## 2022-05-29 LAB — RAD ONC ARIA SESSION SUMMARY
Course Elapsed Days: 27
Plan Fractions Treated to Date: 5
Plan Prescribed Dose Per Fraction: 2 Gy
Plan Total Fractions Prescribed: 6
Plan Total Prescribed Dose: 12 Gy
Reference Point Dosage Given to Date: 10 Gy
Reference Point Session Dosage Given: 2 Gy
Session Number: 20

## 2022-05-29 NOTE — Telephone Encounter (Signed)
[name] called and asked if it is ok to get her asthma injection today since she is getting radiation treatments.  Advised her that it is ok per Dr. Sondra Come.  She verbalized understanding and agreement.

## 2022-05-30 ENCOUNTER — Other Ambulatory Visit: Payer: Self-pay

## 2022-05-30 ENCOUNTER — Ambulatory Visit
Admission: RE | Admit: 2022-05-30 | Discharge: 2022-05-30 | Disposition: A | Discharge status: Home / Self Care | Payer: Medicare Other | Source: Ambulatory Visit | Attending: Radiation Oncology | Admitting: Radiation Oncology

## 2022-05-30 ENCOUNTER — Encounter: Payer: Self-pay | Admitting: Radiation Oncology

## 2022-05-30 DIAGNOSIS — D0512 Intraductal carcinoma in situ of left breast: Secondary | ICD-10-CM | POA: Diagnosis not present

## 2022-05-30 DIAGNOSIS — Z96652 Presence of left artificial knee joint: Secondary | ICD-10-CM | POA: Diagnosis not present

## 2022-05-30 DIAGNOSIS — Z510 Encounter for antineoplastic radiation therapy: Secondary | ICD-10-CM | POA: Diagnosis not present

## 2022-05-30 LAB — RAD ONC ARIA SESSION SUMMARY
Course Elapsed Days: 28
Plan Fractions Treated to Date: 6
Plan Prescribed Dose Per Fraction: 2 Gy
Plan Total Fractions Prescribed: 6
Plan Total Prescribed Dose: 12 Gy
Reference Point Dosage Given to Date: 12 Gy
Reference Point Session Dosage Given: 2 Gy
Session Number: 21

## 2022-05-31 ENCOUNTER — Encounter: Payer: Self-pay | Admitting: *Deleted

## 2022-06-01 ENCOUNTER — Ambulatory Visit: Payer: Self-pay

## 2022-06-01 NOTE — Patient Outreach (Signed)
  Care Coordination   Follow Up Visit Note   06/01/2022 Name: [name] MRN: [id number] DOB: 1945-08-14  [name] is a 76 y.o. year old female who sees [name], MD for primary care. I spoke with  [name] by phone today.  What matters to the patients health and wellness today?  Patient voiced her excitement that she completed her cancer treatment and was able to " ring the bell."  She states she is still using cream for 2 weeks.  Patient states she is scheduled to see her new oncologist in December 2023.  Patient reports she is doing well. Denies any symptoms or anxiety related to treatment.   She reports having strong faith.  Patient reports she completed a nutrition class and states she is now going to follow up with the senior centers to start exercising.     Goals Addressed             This Visit's Progress    Patient stated: Management of health conditions       Care Coordination Interventions: Evaluation of current treatment plan related to Breast  cancer and patient's adherence to plan as established by provider Reviewed medications with patient and discussed importance of compliance Reviewed scheduled/upcoming provider appointments  Discussed patients plans to incorporate exercise into daily routine  Active listening / Emotional support provided Advised to continue cancer treatment: tips   - keep lines of communication open with your provider ( report new or ongoing symptoms as soon as possible).  -Maintain a healthy lifestyle ( eat lean protein, fresh fruits and vegetables, Whole grains and drink plenty of water to stay hydrated).   -Lean on your support system when necessary ( friends/ family).  - Consider joining cancer support group          SDOH assessments and interventions completed:  No     Care Coordination Interventions Activated:  Yes  Care Coordination Interventions:  Yes, provided   Follow up plan: Follow up call scheduled for  07/04/22    Encounter Outcome:  Pt. Visit Completed   Quinn Plowman RN,BSN,CCM [place] [phone] direct line

## 2022-06-03 ENCOUNTER — Other Ambulatory Visit: Payer: Self-pay | Admitting: Family Medicine

## 2022-06-11 ENCOUNTER — Telehealth: Payer: Self-pay

## 2022-06-11 NOTE — Telephone Encounter (Signed)
Received a call from patient stating she has an open area to left nipple, encouraged patient to apply neosporin to area and sonafine to the remainder of left breast. Patient voiced understanding. Patient to call back if no relief. Patient completed 21 fractions to left breast on 05/30/22.

## 2022-06-12 DIAGNOSIS — H25813 Combined forms of age-related cataract, bilateral: Secondary | ICD-10-CM | POA: Diagnosis not present

## 2022-06-12 DIAGNOSIS — H04123 Dry eye syndrome of bilateral lacrimal glands: Secondary | ICD-10-CM | POA: Diagnosis not present

## 2022-06-12 DIAGNOSIS — E119 Type 2 diabetes mellitus without complications: Secondary | ICD-10-CM | POA: Diagnosis not present

## 2022-06-12 DIAGNOSIS — H40013 Open angle with borderline findings, low risk, bilateral: Secondary | ICD-10-CM | POA: Diagnosis not present

## 2022-06-12 DIAGNOSIS — H1045 Other chronic allergic conjunctivitis: Secondary | ICD-10-CM | POA: Diagnosis not present

## 2022-06-12 DIAGNOSIS — H524 Presbyopia: Secondary | ICD-10-CM | POA: Diagnosis not present

## 2022-06-12 LAB — HM DIABETES EYE EXAM

## 2022-06-13 ENCOUNTER — Encounter: Payer: Self-pay | Admitting: Family Medicine

## 2022-06-15 ENCOUNTER — Telehealth: Payer: Self-pay | Admitting: Oncology

## 2022-06-15 NOTE — Telephone Encounter (Signed)
[name] called and wanted to know how long it will take for the lump in the side of her breast to go away.  She said Dr. Sondra Come is aware that she has the lump and has been massaging the area when she was applying Sonafine.  Advised her she can apply vitamin E lotion or oil to the area and continue massaging.  Also discussed Dr. Sondra Come will assess the area when she has her follow up.  She verbalized understanding and also said the open area by her nipple area is healing after using neosporin.

## 2022-06-15 NOTE — Progress Notes (Signed)
Patient Care Team: [name], MD as PCP - General (Family Medicine) Croitoru, Dani Gobble, MD as PCP - Cardiology (Cardiology) Hortencia Pilar, MD as Consulting Physician (Ophthalmology) Neldon Mc, Donnamarie Poag, MD as Consulting Physician (Allergy and Immunology) Anda Kraft, MD as Consulting Physician (Endocrinology) Inocencio Homes, DPM as Consulting Physician (Podiatry) Croitoru, Dani Gobble, MD as Consulting Physician (Cardiology) Druscilla Brownie, MD as Consulting Physician (Dermatology) Boyd Kerbs, MD as Referring Physician (Specialist) Kin, Selinda Eon, [place] as Referring Physician (Chiropractic Medicine) Ronald Lobo, MD as Consulting Physician (Gastroenterology) Hallows, Verlee Monte, MD (Orthopedic Surgery) Dohmeier, Asencion Partridge, MD as Consulting Physician (Neurology) Rockwell Germany, RN as Oncology Nurse Navigator Tressie Ellis, Paulette Blanch, RN as Oncology Nurse Navigator Erroll Luna, MD as Consulting Physician (General Surgery) [name], MD as Consulting Physician (Hematology and Oncology) Gery Pray, MD as Consulting Physician ([place]) Charlton Haws, [facility] as Pharmacist (Pharmacist)  DIAGNOSIS: No diagnosis found.  SUMMARY OF ONCOLOGIC HISTORY: Oncology History  Ductal carcinoma in situ (DCIS) of left breast  02/02/2022 Mammogram   Screening mammogram showed asymmetry in the left breast which is indeterminate.  Diagnostic mammogram and ultrasound was recommended.  Unilateral diagnostic mammogram showed a stable 1.1 cm oval mass in the left breast upper outer aspect middle depth, 6.5 cm from the nipple.  No other significant masses or calcifications were seen. [place] recommended. Stable 1.1 cm oval mass in the left breast upper outer aspect middle depth has been stable since at least 2018 and is consistent with a fibroadenoma and is benign.   02/19/2022 Pathology Results   Pathology from left breast needle core biopsy showed DCIS, intermediate grade, cannot rule focal  microinvasion, no evidence of necrosis or calcifications, estrogen and progesterone unsuccessful on the biopsy specimen due to the neoplasm not present at the deeper levels   02/23/2022 Initial Diagnosis   Ductal carcinoma in situ (DCIS) of left breast    Genetic Testing   [facility] CustomNext Panel was Negative. Report date is 03/13/2022.  The CustomNext-Cancer+RNAinsight panel offered by [facility] includes sequencing and rearrangement analysis for the following 47 genes:  APC, ATM, AXIN2, BARD1, BMPR1A, BRCA1, BRCA2, BRIP1, CDH1, CDK4, CDKN2A, CHEK2, CTNNA1, DICER1, EPCAM, GREM1, HOXB13, KIT, MEN1, MLH1, MSH2, MSH3, MSH6, MUTYH, NBN, NF1, NTHL1, PALB2, PDGFRA, PMS2, POLD1, POLE, PTEN, RAD50, RAD51C, RAD51D, SDHA, SDHB, SDHC, SDHD, SMAD4, SMARCA4, STK11, TP53, TSC1, TSC2, and VHL.  RNA data is routinely analyzed for use in variant interpretation for all genes.   03/29/2022 Pathology Results   DCIS, intermediate grade Tis. Negative for invasive carcinoma. Margins negative Prognostics ER positive 80%, weak staining, PR Negative      CHIEF COMPLIANT: Transfer of care/Left breast DCIS  INTERVAL HISTORY: [name] is a 76 y.o. female with the above-mentioned left breast DCIS. She presents to the clinic to establish care with Dr.Gudena.   ALLERGIES:  is allergic to dilantin [phenytoin], latex, oxycodone, penicillins, codeine, hydrocodone, nickel, and pregabalin.  MEDICATIONS:  Current Outpatient Medications  Medication Sig Dispense Refill   acetaminophen (TYLENOL) 500 MG tablet Take 500-1,000 mg by mouth every 6 (six) hours as needed for moderate pain.     acetaminophen-codeine (TYLENOL #3) 300-30 MG tablet Take 1-2 tablets by mouth every 6 (six) hours as needed for moderate pain. 30 tablet 0   ADVAIR HFA 230-21 MCG/ACT inhaler INHALE 2 PUFFS BY MOUTH TWICE DAILY TO PREVENT COUGH OR WHEEZING. RINSE MOUTH AFTER USE 12 g 2   albuterol (VENTOLIN HFA) 108 (90 Base) MCG/ACT inhaler INHALE 2  PUFFS  BY MOUTH EVERY 6 HOURS AS NEEDED FOR WHEEZING OR SHORTNESS OF BREATH 18 g 1   Artificial Saliva (BIOTENE DRY MOUTH) LOZG Use as directed 1 lozenge in the mouth or throat daily as needed (dry mouth).     atorvastatin (LIPITOR) 10 MG tablet TAKE 1 TABLET(10 MG) BY MOUTH DAILY 90 tablet 3   Blood Glucose Monitoring Suppl (ONETOUCH VERIO) w/Device KIT Use to check blood sugar up to 2 times a day 1 kit 0   Coenzyme Q10 (COQ10) 100 MG CAPS Take 100 mg by mouth daily.     cyclobenzaprine (FLEXERIL) 10 MG tablet Take 0.5-1 tablets (5-10 mg total) by mouth at bedtime as needed for muscle spasms. 20 tablet 0   Empagliflozin-linaGLIPtin (GLYXAMBI) 10-5 MG TABS Take 1 tablet by mouth daily. 30 tablet 5   EPINEPHrine 0.3 mg/0.3 mL IJ SOAJ injection USE AS DIRECTED FOR LIFE THREATENING ALLERGIC REACTIONS 2 each 2   estradiol (ESTRACE) 0.1 MG/GM vaginal cream Place 1 Applicatorful vaginally 2 (two) times a week. (As Needed)     famotidine (PEPCID) 40 MG tablet Take 1 (ONE) tablet by mouth every evening. 90 tablet 1   fluticasone (FLOVENT HFA) 220 MCG/ACT inhaler Inhale 2 puffs into the lungs 2 (two) times daily as needed (shortness of breath).     gabapentin (NEURONTIN) 600 MG tablet TAKE 1 TABLET BY MOUTH EVERY DAY AT BREAKFAST, 1 TABLET AT LUNCH AND 2 TABLETS AT BEDTIME 360 tablet 1   glucose blood (ONETOUCH VERIO) test strip 1 strip 2 (two) times daily     hydrOXYzine (ATARAX/VISTARIL) 10 MG tablet Take 1 tablet (10 mg total) by mouth daily as needed for itching. 30 tablet 2   ipratropium (ATROVENT) 0.06 % nasal spray 1-2 sprays in each nostril 1-2 times per day as needed to dry nose 15 mL 5   irbesartan (AVAPRO) 150 MG tablet Take 1 tablet (150 mg total) by mouth daily. 90 tablet 2   Lactase (DAIRY-RELIEF PO) Take 1 capsule by mouth daily as needed (eating dairy).      Lancets (ONETOUCH DELICA PLUS [id number]) MISC USE TO CHECK BLOOD SUGAR UP TO TWICE DAILY 100 each 5   levalbuterol (XOPENEX) 1.25 MG/3ML  nebulizer solution USE 1 VIAL VIA NEBULIZER EVERY 6 HOURS AS NEEDED FOR SHORTNESS OF BREATH OR WHEEZING 90 mL 3   liver oil-zinc oxide (DESITIN) 40 % ointment Apply 1 application topically as needed for irritation.     MAGNESIUM GLYCINATE PO Take 2 tablets by mouth 2 (two) times daily.     Menthol, Topical Analgesic, (ABSORBINE PLUS JR EX) Apply 1 patch topically daily as needed (pain).     Menthol-Methyl Salicylate (SALONPAS JET SPRAY EX) Apply 1 spray topically daily as needed (knee pain).     METAMUCIL FIBER PO Take 1 Dose by mouth daily. 1 dose = 1/2 tablespoon     methimazole (TAPAZOLE) 5 MG tablet Take 1 tablet (5 mg total) by mouth every other day. 45 tablet 3   montelukast (SINGULAIR) 10 MG tablet Take 1 tablet (10 mg total) by mouth at bedtime. For asthma control. 90 tablet 1   Mouthwashes (BIOTENE DRY MOUTH MT) Use as directed 1 Dose in the mouth or throat daily as needed (dry mouth).     nystatin (MYCOSTATIN) 100000 UNIT/ML suspension 64m swish and swallow after Advair 2 times daily. 473 mL 5   ondansetron (ZOFRAN-ODT) 8 MG disintegrating tablet Take 1 tablet (8 mg total) by mouth every 8 (eight) hours as needed  for nausea or vomiting. 20 tablet 0   ONETOUCH VERIO test strip USE TO CHECK BLOOD SUGAR UP TO TWICE DAILY AS DIRECTED 100 strip 5   oxybutynin (DITROPAN-XL) 10 MG 24 hr tablet Take 10 mg by mouth at bedtime.     pantoprazole (PROTONIX) 40 MG tablet Take 1 (ONE) tablet by mouth every morning. 90 tablet 1   Plecanatide (TRULANCE) 3 MG TABS Take 3 mg by mouth daily as needed (constipation).     Polyethyl Glycol-Propyl Glycol (SYSTANE OP) Place 1 drop into both eyes daily as needed (dry eyes).     polyethylene glycol powder (GLYCOLAX/MIRALAX) 17 GM/SCOOP powder Take 1 Container by mouth once.     PRESCRIPTION MEDICATION Inhale into the lungs at bedtime. CPAP     Tiotropium Bromide Monohydrate (SPIRIVA RESPIMAT) 1.25 MCG/ACT AERS Inhale 2 each into the lungs daily.     tolterodine  (DETROL LA) 4 MG 24 hr capsule Take 4 mg by mouth at bedtime.     traMADol (ULTRAM) 50 MG tablet Take 1-2 tablets (50-100 mg total) by mouth every 6 (six) hours as needed for moderate pain or severe pain (50 mg for moderate, 100 mg for severe). 20 tablet 0   traMADol (ULTRAM) 50 MG tablet Take 1 tablet (50 mg total) by mouth every 6 (six) hours as needed. 20 tablet 0   XARELTO 20 MG TABS tablet TAKE 1 TABLET(20 MG) BY MOUTH DAILY WITH SUPPER 90 tablet 1   Current Facility-Administered Medications  Medication Dose Route Frequency Provider Last Rate Last Admin   omalizumab Arvid Right) injection 300 mg  300 mg Subcutaneous Q28 days Kozlow, Donnamarie Poag, MD   300 mg at 05/29/22 1003    PHYSICAL EXAMINATION: ECOG PERFORMANCE STATUS: {CHL ONC ECOG [phone]}  There were no vitals filed for this visit. There were no vitals filed for this visit.  BREAST:*** No palpable masses or nodules in either right or left breasts. No palpable axillary supraclavicular or infraclavicular adenopathy no breast tenderness or nipple discharge. (exam performed in the presence of a chaperone)  LABORATORY DATA:  I have reviewed the data as listed    Latest Ref Rng & Units 04/24/2022   10:03 AM 02/28/2022    8:24 AM 12/29/2021   11:03 AM  CMP  Glucose 70 - 99 mg/dL 91  99  97   BUN 6 - 23 mg/dL _0 Creatinine 0.40 - 1.20 mg/dL 1.04  1.03  0.83   Sodium 135 - 145 mEq/L 140  138  140   Potassium 3.5 - 5.1 mEq/L 4.2  4.4  4.2   Chloride 96 - 112 mEq/L 106  106  104   CO2 19 - 32 mEq/L _1 Calcium 8.4 - 10.5 mg/dL 9.3  9.1  8.8   Total Protein 6.0 - 8.3 g/dL 7.1  7.0    Total Bilirubin 0.2 - 1.2 mg/dL 0.8  0.8    Alkaline Phos 39 - 117 U/L 88  80    AST 0 - 37 U/L 17  16    ALT 0 - 35 U/L 9  10      Lab Results  Component Value Date   WBC 6.5 02/28/2022   HGB 12.2 02/28/2022   HCT 36.7 02/28/2022   MCV 74.6 (L) 02/28/2022   PLT 253 02/28/2022   NEUTROABS 2.5 02/28/2022    ASSESSMENT &  PLAN:  No problem-specific Assessment & Plan notes found for  this encounter.    No orders of the defined types were placed in this encounter.  The patient has a good understanding of the overall plan. she agrees with it. she will call with any problems that may develop before the next visit here. Total time spent: 30 mins including face to face time and time spent for planning, charting and co-ordination of care   [name], [place] 06/15/22    I Gardiner Coins am scribing for Dr. Lindi Adie  ***

## 2022-06-18 ENCOUNTER — Other Ambulatory Visit: Payer: Self-pay

## 2022-06-18 ENCOUNTER — Ambulatory Visit (INDEPENDENT_AMBULATORY_CARE_PROVIDER_SITE_OTHER): Payer: Medicare Other | Admitting: Podiatry

## 2022-06-18 ENCOUNTER — Inpatient Hospital Stay: Payer: Medicare Other | Attending: Hematology and Oncology | Admitting: Hematology and Oncology

## 2022-06-18 ENCOUNTER — Encounter: Payer: Self-pay | Admitting: Podiatry

## 2022-06-18 VITALS — BP 123/63 | HR 87 | Temp 97.7°F | Resp 18 | Ht 64.25 in | Wt 196.7 lb

## 2022-06-18 DIAGNOSIS — R252 Cramp and spasm: Secondary | ICD-10-CM | POA: Insufficient documentation

## 2022-06-18 DIAGNOSIS — D0512 Intraductal carcinoma in situ of left breast: Secondary | ICD-10-CM | POA: Insufficient documentation

## 2022-06-18 DIAGNOSIS — R232 Flushing: Secondary | ICD-10-CM | POA: Diagnosis not present

## 2022-06-18 DIAGNOSIS — D2372 Other benign neoplasm of skin of left lower limb, including hip: Secondary | ICD-10-CM

## 2022-06-18 DIAGNOSIS — D2371 Other benign neoplasm of skin of right lower limb, including hip: Secondary | ICD-10-CM | POA: Diagnosis not present

## 2022-06-18 DIAGNOSIS — Z7951 Long term (current) use of inhaled steroids: Secondary | ICD-10-CM | POA: Insufficient documentation

## 2022-06-18 DIAGNOSIS — Z7984 Long term (current) use of oral hypoglycemic drugs: Secondary | ICD-10-CM | POA: Insufficient documentation

## 2022-06-18 DIAGNOSIS — M7751 Other enthesopathy of right foot: Secondary | ICD-10-CM | POA: Diagnosis not present

## 2022-06-18 DIAGNOSIS — Z79899 Other long term (current) drug therapy: Secondary | ICD-10-CM | POA: Insufficient documentation

## 2022-06-18 DIAGNOSIS — Z170 Estrogen receptor positive status [ER+]: Secondary | ICD-10-CM | POA: Diagnosis not present

## 2022-06-18 DIAGNOSIS — M722 Plantar fascial fibromatosis: Secondary | ICD-10-CM | POA: Diagnosis not present

## 2022-06-18 MED ORDER — TRIAMCINOLONE ACETONIDE 40 MG/ML IJ SUSP
20.0000 mg | Freq: Once | INTRAMUSCULAR | Status: AC
  Administered 2022-06-18: 20 mg

## 2022-06-18 NOTE — Assessment & Plan Note (Signed)
02/19/2022: Screening mammogram detected left breast asymmetry 1.1 cm oval mass on biopsy revealed intermediate grade DCIS ER 80% weak, PR negative 03/29/2022: Left lumpectomy: Intermediate grade DCIS, margins negative, 1 cm size, ER 80% weak, PR 0% 05/30/2022: Completed adjuvant radiation  Current treatment: Antiestrogen therapy with either tamoxifen or anastrozole

## 2022-06-18 NOTE — Progress Notes (Signed)
She presents today chief concern of painful calluses bilaterally.  She also has pain in the right heel.  Objective: Pulses are palpable.  Multiple reactive keratomas distal aspect of the toes as well as the plantar aspect of the forefoot.  She has tenderness on palpation of the right heel.  Assessment: Planter fasciitis right benign skin lesions bilateral.  Plan: Debridement of benign skin lesions today.  Injected the right heel 20 mg Kenalog 5 mg Marcaine point of maximal tenderness.  Tolerated procedure well follow-up with her as needed.

## 2022-06-19 ENCOUNTER — Telehealth: Payer: Self-pay | Admitting: *Deleted

## 2022-06-19 ENCOUNTER — Encounter: Payer: Self-pay | Admitting: Allergy and Immunology

## 2022-06-19 ENCOUNTER — Ambulatory Visit (INDEPENDENT_AMBULATORY_CARE_PROVIDER_SITE_OTHER): Payer: Medicare Other | Admitting: Allergy and Immunology

## 2022-06-19 VITALS — BP 130/82 | HR 68 | Temp 98.0°F | Resp 18 | Ht 64.0 in | Wt 197.8 lb

## 2022-06-19 DIAGNOSIS — J4550 Severe persistent asthma, uncomplicated: Secondary | ICD-10-CM

## 2022-06-19 DIAGNOSIS — J3089 Other allergic rhinitis: Secondary | ICD-10-CM

## 2022-06-19 DIAGNOSIS — T781XXD Other adverse food reactions, not elsewhere classified, subsequent encounter: Secondary | ICD-10-CM

## 2022-06-19 DIAGNOSIS — K219 Gastro-esophageal reflux disease without esophagitis: Secondary | ICD-10-CM

## 2022-06-19 MED ORDER — FAMOTIDINE 40 MG PO TABS: ORAL_TABLET | ORAL | 1 refills | Status: DC

## 2022-06-19 MED ORDER — FLUTICASONE-SALMETEROL 230-21 MCG/ACT IN AERO: INHALATION_SPRAY | RESPIRATORY_TRACT | 5 refills | Status: DC

## 2022-06-19 MED ORDER — ALBUTEROL SULFATE HFA 108 (90 BASE) MCG/ACT IN AERS: INHALATION_SPRAY | RESPIRATORY_TRACT | 1 refills | Status: DC

## 2022-06-19 MED ORDER — MONTELUKAST SODIUM 10 MG PO TABS: 10.0000 mg | ORAL_TABLET | Freq: Every day | ORAL | 1 refills | Status: DC

## 2022-06-19 MED ORDER — PANTOPRAZOLE SODIUM 40 MG PO TBEC: DELAYED_RELEASE_TABLET | ORAL | 1 refills | Status: DC

## 2022-06-19 MED ORDER — SPIRIVA RESPIMAT 2.5 MCG/ACT IN AERS: INHALATION_SPRAY | RESPIRATORY_TRACT | 2 refills | Status: DC

## 2022-06-19 NOTE — Progress Notes (Signed)
[place] [facility] [place] [facility] [place]   Follow-up Note  Referring Provider: [name], MD Primary Provider: [name], MD Date of Office Visit: 06/19/2022  Subjective:   [name] (DOB: July 07, 1946) is a 76 y.o. female who returns to the [facility] on 06/19/2022 in re-evaluation of the following:  HPI: [name] returns to the clinic in evaluation of asthma, allergic rhinitis, LPR, history of pulmonary hypertension, history of diastolic dysfunction, and food allergy directed against shellfish and fish.  I last saw her in this clinic on 26 December 2021.  Her asthma has been under excellent control.  Rarely does she use a short acting bronchodilator.  She has not had to add in Flovent to her combination inhaler.  She does not exert herself to any significant amount because of musculoskeletal issues.  Her nose has really been doing very well and she rarely uses any nasal steroid.  She does not require any nasal antihistamine or nasal anticholinergic agents.  She has not required a systemic steroid or an antibiotic for any type of airway issue.  Her reflux is under excellent control on her current plan of a proton pump inhibitor and H2 receptor blocker.  She remains away from consumption of fish and shellfish.  She has received the flu vaccine.  Allergies as of 06/19/2022       Reactions   Dilantin [phenytoin] Swelling   facial swelling   Latex Hives   Oxycodone Nausea And Vomiting, Nausea Only   Abdominal Pain, Vomiting   Penicillins Nausea And Vomiting, Swelling   Has patient had a PCN reaction causing immediate rash, facial/tongue/throat swelling, SOB or lightheadedness with hypotension patient had a PCN reaction causing severe rash involving mucus membranes or skin necrosis: [id number] Has patient had a PCN reaction that required hospitalization/No Has patient had a PCN reaction occurring within the last 10 years: No If all of  the above answers are "NO", then may proceed with Cephalosporin use.   Codeine Nausea Only   Tolerates tylenol with codeine   Hydrocodone Nausea And Vomiting   Nickel Hives, Rash   Pregabalin Itching, Other (See Comments)   headaches/problem w/vision        Medication List    ABSORBINE PLUS JR EX Apply 1 patch topically daily as needed (pain).   acetaminophen 500 MG tablet Commonly known as: TYLENOL Take 500-1,000 mg by mouth every 6 (six) hours as needed for moderate pain.   acetaminophen-codeine 300-30 MG tablet Commonly known as: TYLENOL #3 Take 1-2 tablets by mouth every 6 (six) hours as needed for moderate pain.   Advair HFA 230-21 MCG/ACT inhaler Generic drug: fluticasone-salmeterol INHALE 2 PUFFS BY MOUTH TWICE DAILY TO PREVENT COUGH OR WHEEZING. RINSE MOUTH AFTER USE   albuterol 108 (90 Base) MCG/ACT inhaler Commonly known as: VENTOLIN HFA INHALE 2 PUFFS BY MOUTH EVERY 6 HOURS AS NEEDED FOR WHEEZING OR SHORTNESS OF BREATH   atorvastatin 10 MG tablet Commonly known as: LIPITOR TAKE 1 TABLET(10 MG) BY MOUTH DAILY   Biotene Dry Mouth Lozg Use as directed 1 lozenge in the mouth or throat daily as needed (dry mouth).   BIOTENE DRY MOUTH MT Use as directed 1 Dose in the mouth or throat daily as needed (dry mouth).   CoQ10 100 MG Caps Take 100 mg by mouth daily.   cyclobenzaprine 10 MG tablet Commonly known as: FLEXERIL Take 0.5-1 tablets (5-10 mg total) by mouth at bedtime as needed for  muscle spasms.   DAIRY-RELIEF PO Take 1 capsule by mouth daily as needed (eating dairy).   EPINEPHrine 0.3 mg/0.3 mL Soaj injection Commonly known as: EPI-PEN USE AS DIRECTED FOR LIFE THREATENING ALLERGIC REACTIONS   estradiol 0.1 MG/GM vaginal cream Commonly known as: ESTRACE Place 1 Applicatorful vaginally 2 (two) times a week. (As Needed)   famotidine 40 MG tablet Commonly known as: PEPCID Take 1 (ONE) tablet by mouth every evening.   fluticasone 220 MCG/ACT  inhaler Commonly known as: FLOVENT HFA Inhale 2 puffs into the lungs 2 (two) times daily as needed (shortness of breath).   gabapentin 600 MG tablet Commonly known as: NEURONTIN TAKE 1 TABLET BY MOUTH EVERY DAY AT BREAKFAST, 1 TABLET AT LUNCH AND 2 TABLETS AT BEDTIME   Glyxambi 10-5 MG Tabs Generic drug: Empagliflozin-linaGLIPtin Take 1 tablet by mouth daily.   hydrOXYzine 10 MG tablet Commonly known as: ATARAX Take 1 tablet (10 mg total) by mouth daily as needed for itching.   ipratropium 0.06 % nasal spray Commonly known as: ATROVENT 1-2 sprays in each nostril 1-2 times per day as needed to dry nose   irbesartan 150 MG tablet Commonly known as: AVAPRO Take 1 tablet (150 mg total) by mouth daily.   levalbuterol 1.25 MG/3ML nebulizer solution Commonly known as: XOPENEX USE 1 VIAL VIA NEBULIZER EVERY 6 HOURS AS NEEDED FOR SHORTNESS OF BREATH OR WHEEZING   liver oil-zinc oxide 40 % ointment Commonly known as: DESITIN Apply 1 application topically as needed for irritation.   MAGNESIUM GLYCINATE PO Take 2 tablets by mouth 2 (two) times daily.   METAMUCIL FIBER PO Take 1 Dose by mouth daily. 1 dose = 1/2 tablespoon   methimazole 5 MG tablet Commonly known as: TAPAZOLE Take 1 tablet (5 mg total) by mouth every other day.   montelukast 10 MG tablet Commonly known as: SINGULAIR Take 1 tablet (10 mg total) by mouth at bedtime. For asthma control.   nystatin 100000 UNIT/ML suspension Commonly known as: MYCOSTATIN 53m swish and swallow after Advair 2 times daily.   ondansetron 8 MG disintegrating tablet Commonly known as: ZOFRAN-ODT Take 1 tablet (8 mg total) by mouth every 8 (eight) hours as needed for nausea or vomiting.   OneTouch Delica Plus [id number] USE TO CHECK BLOOD SUGAR UP TO TWICE DAILY   OneTouch Verio test strip Generic drug: glucose blood USE TO CHECK BLOOD SUGAR UP TO TWICE DAILY AS DIRECTED   OneTouch Verio test strip Generic drug: glucose  blood 1 strip 2 (two) times daily   OneTouch Verio w/Device Kit Use to check blood sugar up to 2 times a day   oxybutynin 10 MG 24 hr tablet Commonly known as: DITROPAN-XL Take 10 mg by mouth at bedtime.   pantoprazole 40 MG tablet Commonly known as: PROTONIX Take 1 (ONE) tablet by mouth every morning.   polyethylene glycol powder 17 GM/SCOOP powder Commonly known as: GLYCOLAX/MIRALAX Take 1 Container by mouth once.   PRESCRIPTION MEDICATION Inhale into the lungs at bedtime. CPAP   SALONPAS JET SPRAY EX Apply 1 spray topically daily as needed (knee pain).   Spiriva Respimat 1.25 MCG/ACT Aers Generic drug: Tiotropium Bromide Monohydrate Inhale 2 each into the lungs daily.   SYSTANE OP Place 1 drop into both eyes daily as needed (dry eyes).   tolterodine 4 MG 24 hr capsule Commonly known as: DETROL LA Take 4 mg by mouth at bedtime.   traMADol 50 MG tablet Commonly known as: ULTRAM Take 1-2  tablets (50-100 mg total) by mouth every 6 (six) hours as needed for moderate pain or severe pain (50 mg for moderate, 100 mg for severe).   traMADol 50 MG tablet Commonly known as: Ultram Take 1 tablet (50 mg total) by mouth every 6 (six) hours as needed.   Trulance 3 MG Tabs Generic drug: Plecanatide Take 3 mg by mouth daily as needed (constipation).   Xarelto 20 MG Tabs tablet Generic drug: rivaroxaban TAKE 1 TABLET(20 MG) BY MOUTH DAILY WITH SUPPER    Past Medical History:  Diagnosis Date  . Allergic rhinitis   . Allergy   . Arthritis   . Asthma   . Breast cancer ([place])   . Chronic headache   . Diabetes mellitus   . Ductal carcinoma in situ (DCIS) of left breast 02/23/2022  . DVT (deep venous thrombosis) ([place])   . Dyspnea   . Heart murmur   . Hypertension   . Penicillin allergy 01/19/2020  . Pulmonary embolism ([place])   . Pulmonary hypertension ([place])   . Seizures ([place])     Past Surgical History:  Procedure Laterality Date  . ABDOMINAL HYSTERECTOMY      partial, has ovaries  . BREAST LUMPECTOMY WITH RADIOACTIVE SEED LOCALIZATION Left 03/29/2022   Procedure: LEFT BREAST LUMPECTOMY WITH RADIOACTIVE SEED LOCALIZATION;  Surgeon: Erroll Luna, MD;  Location: [facility];  Service: General;  Laterality: Left;  . CARDIAC CATHETERIZATION  12/28/2010   Mod. pulmonary hypertension, normal coronary arteries  . CARDIOVERSION N/A 11/23/2020   Procedure: CARDIOVERSION;  Surgeon: Sanda Klein, MD;  Location: [facility];  Service: Cardiovascular;  Laterality: N/A;  . CHOLECYSTECTOMY N/A 12/20/2021   Procedure: LAPAROSCOPIC CHOLECYSTECTOMY WITH INTRAOPERATIVE CHOLANGIOGRAM;  Surgeon: Armandina Gemma, MD;  Location: [facility];  Service: General;  Laterality: N/A;  . DOPPLER ECHOCARDIOGRAPHY  10/08/2011   EF=>55%,mild asymmetric LVH, mod. TR, mod. PH, mild to mod LA dilatation  . IR LUMBAR DISC ASPIRATION W/IMG GUIDE  01/12/2020  . KNEE ARTHROSCOPY Left   . KNEE SURGERY    . Nuclear Stress Test  05/20/2006   No ischemia  . PARTIAL HYSTERECTOMY    . PLANTAR FASCIA SURGERY    . TONSILLECTOMY      Review of systems negative except as noted in HPI / PMHx or noted below:  Review of Systems  Constitutional: Negative.   HENT: Negative.    Eyes: Negative.   Respiratory: Negative.    Cardiovascular: Negative.   Gastrointestinal: Negative.   Genitourinary: Negative.   Musculoskeletal: Negative.   Skin: Negative.   Neurological: Negative.   Endo/Heme/Allergies: Negative.   Psychiatric/Behavioral: Negative.       Objective:   Vitals:   06/19/22 1115  BP: 130/82  Pulse: 68  Resp: 18  Temp: 98 F (36.7 C)  SpO2: 95%   Height: _0  (162.6 cm)  Weight: 197 lb 12.8 oz (89.7 kg)   Physical Exam Constitutional:      Appearance: She is not diaphoretic.  HENT:     Head: Normocephalic.     Right Ear: Tympanic membrane, ear canal and external ear normal.     Left Ear: Tympanic membrane, ear canal and external ear normal.     Nose: Nose normal. No mucosal  edema or rhinorrhea.     Mouth/Throat:     Pharynx: Uvula midline. No oropharyngeal exudate.  Eyes:     Conjunctiva/sclera: Conjunctivae normal.  Neck:     Thyroid: No thyromegaly.     Trachea: Trachea normal. No tracheal  tenderness or tracheal deviation.  Cardiovascular:     Rate and Rhythm: Normal rate and regular rhythm.     Heart sounds: Normal heart sounds, S1 normal and S2 normal. No murmur heard. Pulmonary:     Effort: No respiratory distress.     Breath sounds: Normal breath sounds. No stridor. No wheezing or rales.  Lymphadenopathy:     Head:     Right side of head: No tonsillar adenopathy.     Left side of head: No tonsillar adenopathy.     Cervical: No cervical adenopathy.  Skin:    Findings: No erythema or rash.     Nails: There is no clubbing.  Neurological:     Mental Status: She is alert.    Diagnostics:    Spirometry was performed and demonstrated an FEV1 of 1.49 at 87 % of predicted.  The patient had an Asthma Control Test with the following results: ACT Total Score: 25.    Assessment and Plan:   1. Asthma, severe persistent, well-controlled   2. Other allergic rhinitis   3. Adverse food reaction, subsequent encounter   4. LPRD (laryngopharyngeal reflux disease)     1. Continue Advair 230 - 2 inhalations 1-2 times a day depending on disease activity  2. Continue Spiriva 2.5 respimat - 1 inhalations 1 time per day.  3. Continue Montelukast 4m - 1 tablet 1 time per day.  4. Continue Xolair (and Epi-Pen)  5. Continue pantoprazole 442min the AM + Famotidine 40 mg in the PM.    6. If needed:   A. Nasal saline several times per day  B. Mucinex DM two times per day  C. Ipratropium 0.06% -1-2 sprays each nostril 1-2 times per day to dry nose  D. Proventil HFA or albuterol nebulization  E. nystatin 5 mL swish and swallow after Advair 2 times a day  F. OTC NASACORT - 1 spray each nostril 1 time per day    7. Add Flovent 220 - 2 inhalations 2 times  a day during "FLARE UP"  8. Return to clinic in 6 months or earlier if there is a problem.  9. Obtain RSV vaccine  Overall MoAuries doing relatively well on her current plan of anti-inflammatory agents for airway including the use of anti-IgE antibody and doing well regarding aggressive therapy directed against her reflux induced respiratory disease.  I am going to continue her on this plan and see her back in this clinic in 6 months or earlier should the be a problem.   ErAllena KatzMD [facility]

## 2022-06-19 NOTE — Patient Instructions (Addendum)
  1. Continue Advair 230 - 2 inhalations 1-2 times a day depending on disease activity  2. Continue Spiriva 2.5 respimat - 1 inhalations 1 time per day.  3. Continue Montelukast '10mg'$  - 1 tablet 1 time per day.  4. Continue Xolair (and Epi-Pen)  5. Continue pantoprazole '40mg'$  in the AM + Famotidine 40 mg in the PM.    6. If needed:   A. Nasal saline several times per day  B. Mucinex DM two times per day  C. Ipratropium 0.06% -1-2 sprays each nostril 1-2 times per day to dry nose  D. Proventil HFA or albuterol nebulization  E. nystatin 5 mL swish and swallow after Advair 2 times a day  F. OTC NASACORT - 1 spray each nostril 1 time per day    7. Add Flovent 220 - 2 inhalations 2 times a day during "FLARE UP"  8. Return to clinic in 6 months or earlier if there is a problem.  9. Obtain RSV vaccine

## 2022-06-19 NOTE — Telephone Encounter (Signed)
Received VM from pt.  RN attempt x1 to return call.  No answer, LVM for pt to return call to the office.  °

## 2022-06-20 ENCOUNTER — Encounter: Payer: Self-pay | Admitting: Allergy and Immunology

## 2022-06-20 ENCOUNTER — Telehealth: Payer: Self-pay | Admitting: Hematology and Oncology

## 2022-06-20 ENCOUNTER — Other Ambulatory Visit (INDEPENDENT_AMBULATORY_CARE_PROVIDER_SITE_OTHER): Payer: Medicare Other

## 2022-06-20 DIAGNOSIS — E0500 Thyrotoxicosis with diffuse goiter without thyrotoxic crisis or storm: Secondary | ICD-10-CM | POA: Diagnosis not present

## 2022-06-20 LAB — T4, FREE: Free T4: 0.77 ng/dL (ref 0.60–1.60)

## 2022-06-20 LAB — TSH: TSH: 2.96 u[IU]/mL (ref 0.35–5.50)

## 2022-06-20 LAB — T3, FREE: T3, Free: 2.8 pg/mL (ref 2.3–4.2)

## 2022-06-20 NOTE — Telephone Encounter (Signed)
Scheduled appointment per 12/4 los. Patient is aware. 

## 2022-06-21 ENCOUNTER — Other Ambulatory Visit: Payer: Self-pay | Admitting: *Deleted

## 2022-06-21 DIAGNOSIS — D0512 Intraductal carcinoma in situ of left breast: Secondary | ICD-10-CM

## 2022-06-22 ENCOUNTER — Encounter: Payer: Self-pay | Admitting: *Deleted

## 2022-06-22 ENCOUNTER — Other Ambulatory Visit: Payer: Self-pay | Admitting: *Deleted

## 2022-06-22 DIAGNOSIS — D0512 Intraductal carcinoma in situ of left breast: Secondary | ICD-10-CM

## 2022-06-22 NOTE — Progress Notes (Signed)
Received call from pt stating US left breast orders need to be sent to [facility].  Orders sucessfully faxed to [facility].

## 2022-06-26 ENCOUNTER — Ambulatory Visit (INDEPENDENT_AMBULATORY_CARE_PROVIDER_SITE_OTHER): Payer: Medicare Other

## 2022-06-26 ENCOUNTER — Other Ambulatory Visit: Payer: Self-pay | Admitting: *Deleted

## 2022-06-26 DIAGNOSIS — J4550 Severe persistent asthma, uncomplicated: Secondary | ICD-10-CM | POA: Diagnosis not present

## 2022-06-26 DIAGNOSIS — M818 Other osteoporosis without current pathological fracture: Secondary | ICD-10-CM

## 2022-06-26 NOTE — Progress Notes (Signed)
Per pt request, orders for bone density successfully faxed to [facility].

## 2022-06-27 ENCOUNTER — Encounter: Payer: Self-pay | Admitting: Radiation Oncology

## 2022-06-28 DIAGNOSIS — N6321 Unspecified lump in the left breast, upper outer quadrant: Secondary | ICD-10-CM | POA: Diagnosis not present

## 2022-06-28 DIAGNOSIS — Z853 Personal history of malignant neoplasm of breast: Secondary | ICD-10-CM | POA: Diagnosis not present

## 2022-06-28 DIAGNOSIS — Z780 Asymptomatic menopausal state: Secondary | ICD-10-CM | POA: Diagnosis not present

## 2022-06-29 ENCOUNTER — Telehealth: Payer: Self-pay | Admitting: Allergy and Immunology

## 2022-06-29 NOTE — Telephone Encounter (Signed)
Patient asking Dr. Neldon Mc if she can get the COVID vaccine while taking AREXVY.  Patient asking if provider will please call pt upon his return to the office. Pt ph [phone]

## 2022-06-30 NOTE — Progress Notes (Incomplete)
  Radiation Oncology         [phone] ________________________________  Patient Name: [name] MRN: [id number] DOB: [birth year] Referring Physician: [name] (Profile Not Attached) Date of Service: 05/30/2022 [facility] [place]                                                        End Of Treatment Note  Diagnoses: D05.12-Intraductal carcinoma in situ of left breast  Cancer Staging: S/p lumpectomy: Stage 0 (cTis (DCIS), cN0, cM0) Left Breast Intermediate grade ductal carcinoma in-situ, ER+ / PR- / Her2 not assessed   Intent: Curative  Radiation Treatment Dates: 05/02/2022 through 05/30/2022 Site Technique Total Dose (Gy) Dose per Fx (Gy) Completed Fx Beam Energies  Breast, Left: Breast_L 3D 40.05/40.05 2.67 15/15 6XFFF  Breast, Left: Breast_L_Bst 3D 12/12 2 6/6 6X   Narrative: The patient tolerated radiation therapy relatively well. During her final weekly treatment check on 05/29/22, she reported an episode of pain to her left nipple area which resolved on its own, hyperpigmentation changes, and slight redness to her left breast/underarm area. Physical exam performed on that same date showed hyperpigmentation changes to the left breast area. No skin breakdown was appreciated.   She was given radiaplex to use throughout the course of treatment.  Plan: The patient will follow-up with radiation oncology in one month .  ________________________________________________ -----------------------------------  Blair Promise, PhD, MD  This document serves as a record of services personally performed by Gery Pray, MD. It was created on his behalf by Roney Mans, a trained medical scribe. The creation of this record is based on the scribe's personal observations and the provider's statements to them. This document has been checked and approved by the attending provider.

## 2022-07-01 ENCOUNTER — Other Ambulatory Visit: Payer: Self-pay | Admitting: Cardiovascular Disease

## 2022-07-01 NOTE — Progress Notes (Signed)
Radiation Oncology         [phone] ________________________________  Name: [name] MRN: [id number]  Date: 07/02/2022  DOB: 1946/02/17  Follow-Up Visit Note  CC: [name], MD  [name], MD  No diagnosis found.  Diagnosis: S/p lumpectomy: Stage 0 (cTis (DCIS), cN0, cM0) Left Breast Intermediate grade ductal carcinoma in-situ, ER+ / PR- / Her2 not assessed    Interval Since Last Radiation: 1 month and 3 days   Intent: Curative  Radiation Treatment Dates: 05/02/2022 through 05/30/2022 Site Technique Total Dose (Gy) Dose per Fx (Gy) Completed Fx Beam Energies  Breast, Left: Breast_L 3D 40.05/40.05 2.67 15/15 6XFFF  Breast, Left: Breast_L_Bst 3D 12/12 2 6/6 6X   Narrative:  The patient returns today for routine follow-up. The patient tolerated radiation therapy relatively well. During her final weekly treatment check on 05/29/22, she reported an episode of pain to her left nipple area which resolved on its own, hyperpigmentation changes, and slight redness to her left breast/underarm area. Physical exam performed on that same date showed hyperpigmentation changes to the left breast area. No skin breakdown was appreciated.    Since completing XRT, the patient met with Dr. Lindi Adie on 06/18/22 to discuss antiestrogen treatment options. During which time, the patient endorsed episodes of hot flashes, muscle cramps, and a history of blood clots. Given her history of blood clots, Dr. Lindi Adie recommended AI consisting of anastrozole (over tamoxifen) which the patient has opted to proceed with.  The patient will return to Dr. Lindi Adie in approximately 3 months to review her survivorship care plan.     Of note: the patient had an LE vascular ultrasound performed on 05/23/22 for evaluation of LE edema which showed no evidence of DVT or obstruction in either lower extremity.   ***                               Allergies:  is allergic to dilantin [phenytoin], latex, oxycodone,  penicillins, codeine, hydrocodone, nickel, and pregabalin.  Meds: Current Outpatient Medications  Medication Sig Dispense Refill   acetaminophen (TYLENOL) 500 MG tablet Take 500-1,000 mg by mouth every 6 (six) hours as needed for moderate pain.     acetaminophen-codeine (TYLENOL #3) 300-30 MG tablet Take 1-2 tablets by mouth every 6 (six) hours as needed for moderate pain. 30 tablet 0   albuterol (VENTOLIN HFA) 108 (90 Base) MCG/ACT inhaler INHALE 2 PUFFS BY MOUTH EVERY 6 HOURS AS NEEDED FOR WHEEZING OR SHORTNESS OF BREATH 18 g 1   Artificial Saliva (BIOTENE DRY MOUTH) LOZG Use as directed 1 lozenge in the mouth or throat daily as needed (dry mouth).     atorvastatin (LIPITOR) 10 MG tablet TAKE 1 TABLET(10 MG) BY MOUTH DAILY 90 tablet 3   Blood Glucose Monitoring Suppl (ONETOUCH VERIO) w/Device KIT Use to check blood sugar up to 2 times a day 1 kit 0   Coenzyme Q10 (COQ10) 100 MG CAPS Take 100 mg by mouth daily.     cyclobenzaprine (FLEXERIL) 10 MG tablet Take 0.5-1 tablets (5-10 mg total) by mouth at bedtime as needed for muscle spasms. 20 tablet 0   Empagliflozin-linaGLIPtin (GLYXAMBI) 10-5 MG TABS Take 1 tablet by mouth daily. 30 tablet 5   EPINEPHrine 0.3 mg/0.3 mL IJ SOAJ injection USE AS DIRECTED FOR LIFE THREATENING ALLERGIC REACTIONS 2 each 2   estradiol (ESTRACE) 0.1 MG/GM vaginal cream Place 1 Applicatorful vaginally 2 (two) times  a week. (As Needed)     famotidine (PEPCID) 40 MG tablet Take 1 (ONE) tablet by mouth every evening. 90 tablet 1   fluticasone (FLOVENT HFA) 220 MCG/ACT inhaler Inhale 2 puffs into the lungs 2 (two) times daily as needed (shortness of breath).     fluticasone-salmeterol (ADVAIR HFA) 230-21 MCG/ACT inhaler 2 inhalations 1-2 times a day depending on disease activity 12 g 5   gabapentin (NEURONTIN) 600 MG tablet TAKE 1 TABLET BY MOUTH EVERY DAY AT BREAKFAST, 1 TABLET AT LUNCH AND 2 TABLETS AT BEDTIME 360 tablet 1   glucose blood (ONETOUCH VERIO) test strip 1  strip 2 (two) times daily     hydrOXYzine (ATARAX/VISTARIL) 10 MG tablet Take 1 tablet (10 mg total) by mouth daily as needed for itching. 30 tablet 2   ipratropium (ATROVENT) 0.06 % nasal spray 1-2 sprays in each nostril 1-2 times per day as needed to dry nose 15 mL 5   irbesartan (AVAPRO) 150 MG tablet Take 1 tablet (150 mg total) by mouth daily. 90 tablet 2   Lactase (DAIRY-RELIEF PO) Take 1 capsule by mouth daily as needed (eating dairy).      Lancets (ONETOUCH DELICA PLUS [id number]) MISC USE TO CHECK BLOOD SUGAR UP TO TWICE DAILY 100 each 5   levalbuterol (XOPENEX) 1.25 MG/3ML nebulizer solution USE 1 VIAL VIA NEBULIZER EVERY 6 HOURS AS NEEDED FOR SHORTNESS OF BREATH OR WHEEZING 90 mL 3   liver oil-zinc oxide (DESITIN) 40 % ointment Apply 1 application topically as needed for irritation.     MAGNESIUM GLYCINATE PO Take 2 tablets by mouth 2 (two) times daily.     Menthol, Topical Analgesic, (ABSORBINE PLUS JR EX) Apply 1 patch topically daily as needed (pain).     Menthol-Methyl Salicylate (SALONPAS JET SPRAY EX) Apply 1 spray topically daily as needed (knee pain).     METAMUCIL FIBER PO Take 1 Dose by mouth daily. 1 dose = 1/2 tablespoon     methimazole (TAPAZOLE) 5 MG tablet Take 1 tablet (5 mg total) by mouth every other day. 45 tablet 3   montelukast (SINGULAIR) 10 MG tablet Take 1 tablet (10 mg total) by mouth at bedtime. For asthma control. 90 tablet 1   Mouthwashes (BIOTENE DRY MOUTH MT) Use as directed 1 Dose in the mouth or throat daily as needed (dry mouth).     nystatin (MYCOSTATIN) 100000 UNIT/ML suspension 74m swish and swallow after Advair 2 times daily. 473 mL 5   ondansetron (ZOFRAN-ODT) 8 MG disintegrating tablet Take 1 tablet (8 mg total) by mouth every 8 (eight) hours as needed for nausea or vomiting. 20 tablet 0   ONETOUCH VERIO test strip USE TO CHECK BLOOD SUGAR UP TO TWICE DAILY AS DIRECTED 100 strip 5   oxybutynin (DITROPAN-XL) 10 MG 24 hr tablet Take 10 mg by mouth at  bedtime.     pantoprazole (PROTONIX) 40 MG tablet Take 1 (ONE) tablet by mouth every morning. 90 tablet 1   Plecanatide (TRULANCE) 3 MG TABS Take 3 mg by mouth daily as needed (constipation).     Polyethyl Glycol-Propyl Glycol (SYSTANE OP) Place 1 drop into both eyes daily as needed (dry eyes).     polyethylene glycol powder (GLYCOLAX/MIRALAX) 17 GM/SCOOP powder Take 1 Container by mouth once.     PRESCRIPTION MEDICATION Inhale into the lungs at bedtime. CPAP     Tiotropium Bromide Monohydrate (SPIRIVA RESPIMAT) 1.25 MCG/ACT AERS Inhale 2 each into the lungs daily.  Tiotropium Bromide Monohydrate (SPIRIVA RESPIMAT) 2.5 MCG/ACT AERS 1 inhalations 1 time per day. 12 g 2   tolterodine (DETROL LA) 4 MG 24 hr capsule Take 4 mg by mouth at bedtime.     traMADol (ULTRAM) 50 MG tablet Take 1-2 tablets (50-100 mg total) by mouth every 6 (six) hours as needed for moderate pain or severe pain (50 mg for moderate, 100 mg for severe). 20 tablet 0   traMADol (ULTRAM) 50 MG tablet Take 1 tablet (50 mg total) by mouth every 6 (six) hours as needed. 20 tablet 0   XARELTO 20 MG TABS tablet TAKE 1 TABLET(20 MG) BY MOUTH DAILY WITH SUPPER 90 tablet 1   Current Facility-Administered Medications  Medication Dose Route Frequency Provider Last Rate Last Admin   omalizumab Arvid Right) injection 300 mg  300 mg Subcutaneous Q28 days [name], MD   300 mg at 06/26/22 [id number]    Physical Findings: The patient is in no acute distress. Patient is alert and oriented.  vitals were not taken for this visit. .  No significant changes. Lungs are clear to auscultation bilaterally. Heart has regular rate and rhythm. No palpable cervical, supraclavicular, or axillary adenopathy. Abdomen soft, non-tender, normal bowel sounds.  Right Breast: no palpable mass, nipple discharge or bleeding. Left Breast: ***  Lab Findings: Lab Results  Component Value Date   WBC 6.5 02/28/2022   HGB 12.2 02/28/2022   HCT 36.7 02/28/2022   MCV  74.6 (L) 02/28/2022   PLT 253 02/28/2022    Radiographic Findings: No results found.  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  ***  Plan:  ***   *** minutes of total time was spent for this patient encounter, including preparation, face-to-face counseling with the patient and coordination of care, physical exam, and documentation of the encounter. ____________________________________  Blair Promise, PhD, MD  This document serves as a record of services personally performed by Gery Pray, MD. It was created on his behalf by Roney Mans, a trained medical scribe. The creation of this record is based on the scribe's personal observations and the provider's statements to them. This document has been checked and approved by the attending provider.

## 2022-07-02 ENCOUNTER — Ambulatory Visit
Admission: RE | Admit: 2022-07-02 | Discharge: 2022-07-02 | Disposition: A | Discharge status: Home / Self Care | Payer: Medicare Other | Source: Ambulatory Visit | Attending: Radiation Oncology | Admitting: Radiation Oncology

## 2022-07-02 ENCOUNTER — Telehealth: Payer: Self-pay | Admitting: *Deleted

## 2022-07-02 ENCOUNTER — Encounter: Payer: Self-pay | Admitting: Radiation Oncology

## 2022-07-02 VITALS — BP 140/60 | HR 75 | Temp 97.9°F | Resp 20 | Ht 64.0 in | Wt 200.6 lb

## 2022-07-02 DIAGNOSIS — Z170 Estrogen receptor positive status [ER+]: Secondary | ICD-10-CM | POA: Diagnosis not present

## 2022-07-02 DIAGNOSIS — Z923 Personal history of irradiation: Secondary | ICD-10-CM | POA: Diagnosis not present

## 2022-07-02 DIAGNOSIS — Z79811 Long term (current) use of aromatase inhibitors: Secondary | ICD-10-CM | POA: Insufficient documentation

## 2022-07-02 DIAGNOSIS — D0512 Intraductal carcinoma in situ of left breast: Secondary | ICD-10-CM | POA: Diagnosis not present

## 2022-07-02 DIAGNOSIS — Z7951 Long term (current) use of inhaled steroids: Secondary | ICD-10-CM | POA: Diagnosis not present

## 2022-07-02 DIAGNOSIS — Z79899 Other long term (current) drug therapy: Secondary | ICD-10-CM | POA: Insufficient documentation

## 2022-07-02 DIAGNOSIS — Z7984 Long term (current) use of oral hypoglycemic drugs: Secondary | ICD-10-CM | POA: Insufficient documentation

## 2022-07-02 DIAGNOSIS — S93401A Sprain of unspecified ligament of right ankle, initial encounter: Secondary | ICD-10-CM | POA: Insufficient documentation

## 2022-07-02 DIAGNOSIS — Z7901 Long term (current) use of anticoagulants: Secondary | ICD-10-CM | POA: Diagnosis not present

## 2022-07-02 DIAGNOSIS — S93491A Sprain of other ligament of right ankle, initial encounter: Secondary | ICD-10-CM | POA: Insufficient documentation

## 2022-07-02 DIAGNOSIS — K148 Other diseases of tongue: Secondary | ICD-10-CM | POA: Insufficient documentation

## 2022-07-02 HISTORY — DX: Personal history of irradiation: Z92.3

## 2022-07-02 NOTE — Progress Notes (Signed)
[name] is here today for follow up post radiation to the breast.   Breast Side: Left   They completed their radiation on: 05/30/22  Does the patient complain of any of the following: Post radiation skin issues: no Breast Tenderness: yes Breast Swelling: no Lymphadema: no Range of Motion limitations: no Fatigue post radiation: she states that feels tired some days. Plan to get in exercise program.  Appetite good/fair/poor: Good  Additional comments if applicable:

## 2022-07-02 NOTE — Telephone Encounter (Signed)
Per MD request RN placed call to pt regarding recent bone density showing T score -0.1.  pt educated and verbalized understanding.

## 2022-07-02 NOTE — Assessment & Plan Note (Signed)
ICe, elevation and brace. No indication for x-ray.

## 2022-07-02 NOTE — Assessment & Plan Note (Signed)
Continue bitoin mouth wash.

## 2022-07-03 ENCOUNTER — Encounter: Payer: Self-pay | Admitting: Hematology and Oncology

## 2022-07-05 ENCOUNTER — Telehealth: Payer: Self-pay

## 2022-07-05 ENCOUNTER — Telehealth: Payer: Self-pay | Admitting: Allergy and Immunology

## 2022-07-05 ENCOUNTER — Ambulatory Visit: Payer: Self-pay

## 2022-07-05 NOTE — Patient Outreach (Signed)
  Care Coordination   Follow Up Visit Note   07/05/2022 Name: [name] MRN: [id number] DOB: [birth year]  [name] is a 76 y.o. year old female who sees Alicia Sanders, MD for primary care. I spoke with  [name] by phone today.  What matters to the patients health and wellness today?  Ongoing management and education of health conditions.     Goals Addressed             This Visit's Progress    Patient stated: Management of health conditions       Care Coordination Interventions: Evaluation of current treatment plan related to Breast  cancer and patient's adherence to plan as established by provider:  Patient reports having visit with new oncologist. She states she was very pleased. She states provider took time to explain things to her and answer her questions. She states she is scheduled to speak with someone in the oncology office in March 2024 regarding survivorship care plan.  Patient states she reported a left breast lump to the oncologist. She reports having mammogram done.  Reviewed medications with patient and discussed importance of compliance:  Patient denies any changes in her medications.  Reviewed scheduled/upcoming provider appointments   Active listening / Emotional support provided Advised to continue cancer treatment: tips   - keep lines of communication open with your provider ( report new or ongoing symptoms as soon as possible).  -Maintain a healthy lifestyle ( eat lean protein, fresh fruits and vegetables, Whole grains and drink plenty of water to stay hydrated).   -Lean on your support system when necessary ( friends/ family).  - Consider joining cancer support group  - Exercises / increase activity as tolerated.           SDOH assessments and interventions completed:  No     Care Coordination Interventions:  Yes, provided   Follow up plan: Follow up call scheduled for 09/04/22    Encounter Outcome:  Pt. Visit Completed   Alicia Plowman RN,BSN,CCM [place] [phone] direct line

## 2022-07-05 NOTE — Telephone Encounter (Signed)
Patient states she has been dealing with a bad cough and is taking Mucinex DM but wants to know what is the best cough syrup she can buy.

## 2022-07-05 NOTE — Patient Outreach (Signed)
  Care Coordination   07/05/2022 Name: [name] MRN: [id number] DOB: October 01, 1945   Care Coordination Outreach Attempts:  An unsuccessful telephone outreach was attempted for a scheduled appointment today.  Follow Up Plan:  Additional outreach attempts will be made to offer the patient care coordination information and services.   Encounter Outcome:  No Answer   Care Coordination Interventions:  No, not indicated    Quinn Plowman [facility] [place] [phone] direct line

## 2022-07-11 ENCOUNTER — Other Ambulatory Visit: Payer: Self-pay | Admitting: *Deleted

## 2022-07-11 ENCOUNTER — Other Ambulatory Visit: Payer: Self-pay | Admitting: Hematology and Oncology

## 2022-07-11 MED ORDER — ANASTROZOLE 1 MG PO TABS: 1.0000 mg | ORAL_TABLET | Freq: Every day | ORAL | 1 refills | Status: DC

## 2022-07-11 NOTE — Progress Notes (Signed)
Received call from pt stating she is ready to start Anastrozole.  RN reviewed with MD and verbal orders received to send to pharmacy on file as well as f/u in 1 month to review pt tolerance.  Appt scheduled, pt educated and verbalized understanding.

## 2022-07-24 ENCOUNTER — Ambulatory Visit (INDEPENDENT_AMBULATORY_CARE_PROVIDER_SITE_OTHER): Payer: Medicare Other

## 2022-07-24 DIAGNOSIS — J4550 Severe persistent asthma, uncomplicated: Secondary | ICD-10-CM | POA: Diagnosis not present

## 2022-07-25 ENCOUNTER — Other Ambulatory Visit: Payer: Self-pay | Admitting: Cardiovascular Disease

## 2022-07-25 ENCOUNTER — Ambulatory Visit: Payer: Medicare Other

## 2022-07-25 DIAGNOSIS — I480 Paroxysmal atrial fibrillation: Secondary | ICD-10-CM

## 2022-07-25 NOTE — Progress Notes (Signed)
Patient presents to the office today for diabetic shoe and insole measuring.  Patient was measured with brannock device to determine size and width for 1 pair of extra depth shoes and foam casted for 3 pair of insoles.   Documentation of medical necessity will be sent to patient's treating diabetic doctor to verify and sign.   Patient's diabetic provider: Irene Pap  Shoes and insoles will be ordered at that time and patient will be notified for an appointment for fitting when they arrive.   Shoe size (per patient): 11 in women    Brannock measurement: 11   Patient shoe selection-   Shoe choice:   [id number] [employer] mary jane  [phone] [employer] runner  Shoe size ordered: 11

## 2022-07-31 ENCOUNTER — Telehealth: Payer: Self-pay | Admitting: Allergy and Immunology

## 2022-07-31 MED ORDER — DESLORATADINE 5 MG PO TABS: 5.0000 mg | ORAL_TABLET | Freq: Every day | ORAL | 1 refills | Status: DC

## 2022-07-31 NOTE — Telephone Encounter (Signed)
Per provider:   Can use desloratadine 5 mg - 1 tablet 1 time per day   Called patient - DOB/Pharmacy verified - advised of provider notation above.   Patient verbalized understanding, no further questions.

## 2022-07-31 NOTE — Telephone Encounter (Signed)
Patient would like to know if Dr. Neldon Mc can send in desloratadine for her.  Please advise  Best contact number: [phone]  [name] [address]

## 2022-08-13 ENCOUNTER — Ambulatory Visit (INDEPENDENT_AMBULATORY_CARE_PROVIDER_SITE_OTHER): Payer: Medicare Other | Admitting: Podiatry

## 2022-08-13 ENCOUNTER — Encounter: Payer: Self-pay | Admitting: Podiatry

## 2022-08-13 VITALS — BP 116/63 | HR 67 | Temp 98.0°F

## 2022-08-13 DIAGNOSIS — L84 Corns and callosities: Secondary | ICD-10-CM

## 2022-08-13 DIAGNOSIS — E1142 Type 2 diabetes mellitus with diabetic polyneuropathy: Secondary | ICD-10-CM | POA: Diagnosis not present

## 2022-08-13 DIAGNOSIS — B351 Tinea unguium: Secondary | ICD-10-CM | POA: Diagnosis not present

## 2022-08-13 DIAGNOSIS — M79676 Pain in unspecified toe(s): Secondary | ICD-10-CM

## 2022-08-13 DIAGNOSIS — Q828 Other specified congenital malformations of skin: Secondary | ICD-10-CM | POA: Diagnosis not present

## 2022-08-13 NOTE — Progress Notes (Signed)
Patient Care Team: [name], MD as PCP - General (Family Medicine) Croitoru, Dani Gobble, MD as PCP - Cardiology (Cardiology) Hortencia Pilar, MD as Consulting Physician (Ophthalmology) Neldon Mc, Donnamarie Poag, MD as Consulting Physician (Allergy and Immunology) Anda Kraft, MD as Consulting Physician (Endocrinology) Inocencio Homes, DPM as Consulting Physician (Podiatry) Croitoru, Dani Gobble, MD as Consulting Physician (Cardiology) Druscilla Brownie, MD as Consulting Physician (Dermatology) Boyd Kerbs, MD as Referring Physician (Specialist) Kin, Selinda Eon, [place] as Referring Physician (Chiropractic Medicine) Ronald Lobo, MD as Consulting Physician (Gastroenterology) Hallows, Verlee Monte, MD (Orthopedic Surgery) Dohmeier, Asencion Partridge, MD as Consulting Physician (Neurology) Rockwell Germany, RN as Oncology Nurse Navigator Tressie Ellis, Paulette Blanch, RN as Oncology Nurse Navigator Erroll Luna, MD as Consulting Physician (General Surgery) [name], MD as Consulting Physician (Hematology and Oncology) Gery Pray, MD as Consulting Physician ([place]) Charlton Haws, [facility] as Pharmacist (Pharmacist)  DIAGNOSIS: No diagnosis found.  SUMMARY OF ONCOLOGIC HISTORY: Oncology History  Ductal carcinoma in situ (DCIS) of left breast  02/19/2022 Initial Diagnosis   Screening mammogram detected left breast asymmetry 1.1 cm oval mass on biopsy revealed intermediate grade DCIS ER 80% weak, PR negative    Genetic Testing   [facility] CustomNext Panel was Negative. Report date is 03/13/2022.  The CustomNext-Cancer+RNAinsight panel offered by [facility] includes sequencing and rearrangement analysis for the following 47 genes:  APC, ATM, AXIN2, BARD1, BMPR1A, BRCA1, BRCA2, BRIP1, CDH1, CDK4, CDKN2A, CHEK2, CTNNA1, DICER1, EPCAM, GREM1, HOXB13, KIT, MEN1, MLH1, MSH2, MSH3, MSH6, MUTYH, NBN, NF1, NTHL1, PALB2, PDGFRA, PMS2, POLD1, POLE, PTEN, RAD50, RAD51C, RAD51D, SDHA, SDHB, SDHC, SDHD, SMAD4,  SMARCA4, STK11, TP53, TSC1, TSC2, and VHL.  RNA data is routinely analyzed for use in variant interpretation for all genes.   03/29/2022 Surgery   Left lumpectomy: Intermediate grade DCIS, margins negative, 1 cm size, ER 80% weak, PR 0%     CHIEF COMPLIANT:   INTERVAL HISTORY: JOLAINE FRYBERGER is a   ALLERGIES:  is allergic to dilantin [phenytoin], latex, oxycodone, penicillins, codeine, hydrocodone, nickel, and pregabalin.  MEDICATIONS:  Current Outpatient Medications  Medication Sig Dispense Refill   desloratadine (CLARINEX) 5 MG tablet Take 1 tablet (5 mg total) by mouth daily. 90 tablet 1   irbesartan (AVAPRO) 150 MG tablet TAKE 1 TABLET(150 MG) BY MOUTH DAILY 90 tablet 2   XARELTO 20 MG TABS tablet TAKE 1 TABLET(20 MG) BY MOUTH DAILY WITH SUPPER 90 tablet 1   acetaminophen (TYLENOL) 500 MG tablet Take 500-1,000 mg by mouth every 6 (six) hours as needed for moderate pain.     acetaminophen-codeine (TYLENOL #3) 300-30 MG tablet Take 1-2 tablets by mouth every 6 (six) hours as needed for moderate pain. 30 tablet 0   albuterol (VENTOLIN HFA) 108 (90 Base) MCG/ACT inhaler INHALE 2 PUFFS BY MOUTH EVERY 6 HOURS AS NEEDED FOR WHEEZING OR SHORTNESS OF BREATH 18 g 1   anastrozole (ARIMIDEX) 1 MG tablet TAKE 1 TABLET(1 MG) BY MOUTH DAILY 90 tablet 0   Artificial Saliva (BIOTENE DRY MOUTH) LOZG Use as directed 1 lozenge in the mouth or throat daily as needed (dry mouth).     atorvastatin (LIPITOR) 10 MG tablet TAKE 1 TABLET(10 MG) BY MOUTH DAILY 90 tablet 3   Blood Glucose Monitoring Suppl (ONETOUCH VERIO) w/Device KIT Use to check blood sugar up to 2 times a day 1 kit 0   Coenzyme Q10 (COQ10) 100 MG CAPS Take 100 mg by mouth daily.     cyclobenzaprine (FLEXERIL) 10 MG tablet Take  0.5-1 tablets (5-10 mg total) by mouth at bedtime as needed for muscle spasms. 20 tablet 0   Empagliflozin-linaGLIPtin (GLYXAMBI) 10-5 MG TABS Take 1 tablet by mouth daily. 30 tablet 5   EPINEPHrine 0.3 mg/0.3 mL IJ  SOAJ injection USE AS DIRECTED FOR LIFE THREATENING ALLERGIC REACTIONS 2 each 2   estradiol (ESTRACE) 0.1 MG/GM vaginal cream Place 1 Applicatorful vaginally 2 (two) times a week. (As Needed)     famotidine (PEPCID) 40 MG tablet Take 1 (ONE) tablet by mouth every evening. 90 tablet 1   fluticasone (FLOVENT HFA) 220 MCG/ACT inhaler Inhale 2 puffs into the lungs 2 (two) times daily as needed (shortness of breath).     fluticasone-salmeterol (ADVAIR HFA) 230-21 MCG/ACT inhaler 2 inhalations 1-2 times a day depending on disease activity 12 g 5   gabapentin (NEURONTIN) 600 MG tablet TAKE 1 TABLET BY MOUTH EVERY DAY AT BREAKFAST, 1 TABLET AT LUNCH AND 2 TABLETS AT BEDTIME 360 tablet 1   glucose blood (ONETOUCH VERIO) test strip 1 strip 2 (two) times daily     hydrOXYzine (ATARAX/VISTARIL) 10 MG tablet Take 1 tablet (10 mg total) by mouth daily as needed for itching. 30 tablet 2   ipratropium (ATROVENT) 0.06 % nasal spray 1-2 sprays in each nostril 1-2 times per day as needed to dry nose 15 mL 5   Lactase (DAIRY-RELIEF PO) Take 1 capsule by mouth daily as needed (eating dairy).      Lancets (ONETOUCH DELICA PLUS [id number]) MISC USE TO CHECK BLOOD SUGAR UP TO TWICE DAILY 100 each 5   levalbuterol (XOPENEX) 1.25 MG/3ML nebulizer solution USE 1 VIAL VIA NEBULIZER EVERY 6 HOURS AS NEEDED FOR SHORTNESS OF BREATH OR WHEEZING 90 mL 3   liver oil-zinc oxide (DESITIN) 40 % ointment Apply 1 application topically as needed for irritation.     MAGNESIUM GLYCINATE PO Take 2 tablets by mouth 2 (two) times daily.     Menthol, Topical Analgesic, (ABSORBINE PLUS JR EX) Apply 1 patch topically daily as needed (pain).     Menthol-Methyl Salicylate (SALONPAS JET SPRAY EX) Apply 1 spray topically daily as needed (knee pain).     METAMUCIL FIBER PO Take 1 Dose by mouth daily. 1 dose = 1/2 tablespoon     methimazole (TAPAZOLE) 5 MG tablet Take 1 tablet (5 mg total) by mouth every other day. 45 tablet 3   montelukast (SINGULAIR)  10 MG tablet Take 1 tablet (10 mg total) by mouth at bedtime. For asthma control. 90 tablet 1   Mouthwashes (BIOTENE DRY MOUTH MT) Use as directed 1 Dose in the mouth or throat daily as needed (dry mouth).     nystatin (MYCOSTATIN) 100000 UNIT/ML suspension 40m swish and swallow after Advair 2 times daily. 473 mL 5   ondansetron (ZOFRAN-ODT) 8 MG disintegrating tablet Take 1 tablet (8 mg total) by mouth every 8 (eight) hours as needed for nausea or vomiting. 20 tablet 0   ONETOUCH VERIO test strip USE TO CHECK BLOOD SUGAR UP TO TWICE DAILY AS DIRECTED 100 strip 5   oxybutynin (DITROPAN-XL) 10 MG 24 hr tablet Take 10 mg by mouth at bedtime.     pantoprazole (PROTONIX) 40 MG tablet Take 1 (ONE) tablet by mouth every morning. 90 tablet 1   Plecanatide (TRULANCE) 3 MG TABS Take 3 mg by mouth daily as needed (constipation).     Polyethyl Glycol-Propyl Glycol (SYSTANE OP) Place 1 drop into both eyes daily as needed (dry eyes).  polyethylene glycol powder (GLYCOLAX/MIRALAX) 17 GM/SCOOP powder Take 1 Container by mouth once.     PRESCRIPTION MEDICATION Inhale into the lungs at bedtime. CPAP     Tiotropium Bromide Monohydrate (SPIRIVA RESPIMAT) 1.25 MCG/ACT AERS Inhale 2 each into the lungs daily.     Tiotropium Bromide Monohydrate (SPIRIVA RESPIMAT) 2.5 MCG/ACT AERS 1 inhalations 1 time per day. 12 g 2   tolterodine (DETROL LA) 4 MG 24 hr capsule Take 4 mg by mouth at bedtime.     traMADol (ULTRAM) 50 MG tablet Take 1-2 tablets (50-100 mg total) by mouth every 6 (six) hours as needed for moderate pain or severe pain (50 mg for moderate, 100 mg for severe). 20 tablet 0   traMADol (ULTRAM) 50 MG tablet Take 1 tablet (50 mg total) by mouth every 6 (six) hours as needed. 20 tablet 0   Current Facility-Administered Medications  Medication Dose Route Frequency Provider Last Rate Last Admin   omalizumab Arvid Right) injection 300 mg  300 mg Subcutaneous Q28 days [name], MD   300 mg at 07/24/22 1033     PHYSICAL EXAMINATION: ECOG PERFORMANCE STATUS: {CHL ONC ECOG [phone]}  There were no vitals filed for this visit. There were no vitals filed for this visit.  BREAST:*** No palpable masses or nodules in either right or left breasts. No palpable axillary supraclavicular or infraclavicular adenopathy no breast tenderness or nipple discharge. (exam performed in the presence of a chaperone)  LABORATORY DATA:  I have reviewed the data as listed    Latest Ref Rng & Units 04/24/2022   10:03 AM 02/28/2022    8:24 AM 12/29/2021   11:03 AM  CMP  Glucose 70 - 99 mg/dL 91  99  97   BUN 6 - 23 mg/dL '13  16  6   '$ Creatinine 0.40 - 1.20 mg/dL 1.04  1.03  0.83   Sodium 135 - 145 mEq/L 140  138  140   Potassium 3.5 - 5.1 mEq/L 4.2  4.4  4.2   Chloride 96 - 112 mEq/L 106  106  104   CO2 19 - 32 mEq/L '26  29  29   '$ Calcium 8.4 - 10.5 mg/dL 9.3  9.1  8.8   Total Protein 6.0 - 8.3 g/dL 7.1  7.0    Total Bilirubin 0.2 - 1.2 mg/dL 0.8  0.8    Alkaline Phos 39 - 117 U/L 88  80    AST 0 - 37 U/L 17  16    ALT 0 - 35 U/L 9  10      Lab Results  Component Value Date   WBC 6.5 02/28/2022   HGB 12.2 02/28/2022   HCT 36.7 02/28/2022   MCV 74.6 (L) 02/28/2022   PLT 253 02/28/2022   NEUTROABS 2.5 02/28/2022    ASSESSMENT & PLAN:  No problem-specific Assessment & Plan notes found for this encounter.    No orders of the defined types were placed in this encounter.  The patient has a good understanding of the overall plan. she agrees with it. she will call with any problems that may develop before the next visit here. Total time spent: 30 mins including face to face time and time spent for planning, charting and co-ordination of care   Suzzette Righter, [place] 08/13/22    I Gardiner Coins am acting as a Education administrator for [employer]  ***

## 2022-08-13 NOTE — Assessment & Plan Note (Signed)
02/19/2022: Screening mammogram detected left breast asymmetry 1.1 cm oval mass on biopsy revealed intermediate grade DCIS ER 80% weak, PR negative 03/29/2022: Left lumpectomy: Intermediate grade DCIS, margins negative, 1 cm size, ER 80% weak, PR 0% 05/30/2022: Completed adjuvant radiation   Current treatment: Antiestrogen therapy with anastrozole Anastrozole Toxicities:  Breast Cancer Surveillance: Mammogram: 07/03/22: Benign Breast Exam 08/14/22: Benign

## 2022-08-13 NOTE — Progress Notes (Signed)
Subjective:  Patient ID: [name], female    DOB: 1946-07-01,  MRN: [id number]  [name] presents to clinic today for at risk foot care with history of diabetic neuropathy and preulcerative lesion(s) b/l lower extremities and painful mycotic toenails that limit ambulation. Painful toenails interfere with ambulation. Aggravating factors include wearing enclosed shoe gear. Pain is relieved with periodic professional debridement. Painful porokeratotic lesions are aggravated when weightbearing with and without shoegear. Pain is relieved with periodic professional debridement.  Chief Complaint  Patient presents with   Follow-up    Routine foot care Patient is a diabetic Last A1c-5.6 PCP- Amy Bedsole,MD last visit in October 2023   New problem(s): None.   PCP is Excell Seltzer, MD.  Allergies  Allergen Reactions   Dilantin [Phenytoin] Swelling    facial swelling   Latex Hives   Oxycodone Nausea And Vomiting and Nausea Only    Abdominal Pain, Vomiting   Penicillins Nausea And Vomiting and Swelling    Has patient had a PCN reaction causing immediate rash, facial/tongue/throat swelling, SOB or lightheadedness with hypotension patient had a PCN reaction causing severe rash involving mucus membranes or skin necrosis: [id number]} Has patient had a PCN reaction that required hospitalization/No Has patient had a PCN reaction occurring within the last 10 years: No If all of the above answers are "NO", then may proceed with Cephalosporin use.      Codeine Nausea Only    Tolerates tylenol with codeine   Hydrocodone Nausea And Vomiting   Nickel Hives and Rash   Pregabalin Itching and Other (See Comments)    headaches/problem w/vision    Review of Systems: Negative except as noted in the HPI.  Objective:  Vitals:   08/13/22 1341  BP: 116/63  Pulse: 67  Temp: 98 F (36.7 C)  SpO2: 95%   [name] is a pleasant 77 y.o. female WD, WN in NAD. AAO x  3.  Neurovascular Examination: CFT <3 seconds b/l LE. Palpable DP/PT pulses b/l LE. Digital hair sparse b/l. Skin temperature gradient WNL b/l. No pain with calf compression b/l. No edema noted b/l. No cyanosis or clubbing noted b/l LE.  Pt has subjective symptoms of neuropathy. Protective sensation decreased with 10 gram monofilament b/l.  Dermatological:  Pedal integument with normal turgor, texture and tone BLE. No open wounds b/l LE. No interdigital macerations noted b/l LE. Toenails 2-5 bilaterally elongated, discolored, dystrophic, thickened, and crumbly with subungual debris and tenderness to dorsal palpation.   Anonychia noted bilateral great toes. Nailbed(s) epithelialized.    Porokeratotic lesion(s) sub 5th met base RLE and submet head 5 b/l.  No erythema, no edema, no drainage, no fluctuance.  Preulcerative lesion noted distal tip of left great toe. There is visible subdermal hemorrhage. There is no surrounding erythema, no edema, no drainage, no odor, no fluctuance.  Assessment/Plan: 1. Pain due to onychomycosis of toenail   2. Porokeratosis   3. Pre-ulcerative calluses   4. Diabetic polyneuropathy associated with type 2 diabetes mellitus (HCC)     -Consent given for treatment as described below: -Examined patient. -Continue supportive shoe gear daily. -Toenails 2-5 bilaterally debrided in length and girth without iatrogenic bleeding with sterile nail nipper and dremel.  -Preulcerative lesion pared distal tip of left great toe utilizing sterile scalpel blade. Total number pared=1. -Porokeratotic lesion(s) submet head 5 b/l and sub 5th met base right foot pared and enucleated with sterile currette without incident. Total number of lesions debrided=3. -Patient/POA to call  should there be question/concern in the interim.   Return in about 3 months (around 11/12/2022).  Freddie Breech, DPM

## 2022-08-14 ENCOUNTER — Inpatient Hospital Stay: Payer: Medicare Other | Attending: Hematology and Oncology | Admitting: Hematology and Oncology

## 2022-08-14 ENCOUNTER — Other Ambulatory Visit: Payer: Self-pay

## 2022-08-14 ENCOUNTER — Telehealth: Payer: Self-pay | Admitting: Family Medicine

## 2022-08-14 VITALS — BP 145/60 | HR 74 | Temp 97.2°F | Resp 17 | Wt 199.2 lb

## 2022-08-14 DIAGNOSIS — Z79811 Long term (current) use of aromatase inhibitors: Secondary | ICD-10-CM | POA: Insufficient documentation

## 2022-08-14 DIAGNOSIS — D0512 Intraductal carcinoma in situ of left breast: Secondary | ICD-10-CM | POA: Diagnosis not present

## 2022-08-14 NOTE — Telephone Encounter (Signed)
Patient called and stated she seen oncologist and she wanted to ask some questions about medications. Call back number [phone].

## 2022-08-14 NOTE — Telephone Encounter (Signed)
I spoke with [name]; [name] said starting last night [name] was having lt leg cramping from the foot to the knee. [name] said she tried to walk but really did not help the pain. [name] said her lt lower leg hurt all last night until this morning around 5 AM. [name] drank some tonic water and eventually [name] felt no pain where [name] could go to sleep.No CP or SOB. [name] said has had lower leg cramps before but never for all night. [name] thinks she needs to see Dr Diona Browner. I offered [name] an appt with a different provider on 08/15/22 but [name] prefers to see Dr Diona Browner. [name] scheduled appt with Dr Diona Browner on 08/16/22 at 72 AM with UC & ED precautions. Sending note to Dr Diona Browner and [place] pool.

## 2022-08-14 NOTE — Telephone Encounter (Signed)
Call patient Have her increase water intake as dehydration can cause leg cramps.  She can also start tonic water or a tablespoon of yellow mustard at bedtime to help for possible idiopathic cramps.  If it is not improving I will see her on February 1 when we can check labs.

## 2022-08-15 NOTE — Telephone Encounter (Signed)
[name] notified as instructed by telephone.  Per message below patient is already tried tonic water and states she is drinking lots of water.  She will try the yellow mustard tonight, but will keep appointment tomorrow as scheduled.

## 2022-08-16 ENCOUNTER — Ambulatory Visit (INDEPENDENT_AMBULATORY_CARE_PROVIDER_SITE_OTHER): Payer: Medicare Other | Admitting: Family Medicine

## 2022-08-16 ENCOUNTER — Encounter: Payer: Self-pay | Admitting: Family Medicine

## 2022-08-16 VITALS — BP 130/76 | HR 67 | Temp 98.0°F | Resp 16 | Ht 64.25 in | Wt 202.4 lb

## 2022-08-16 DIAGNOSIS — R252 Cramp and spasm: Secondary | ICD-10-CM | POA: Diagnosis not present

## 2022-08-16 NOTE — Progress Notes (Signed)
Patient ID: [name], female    DOB: 02-08-1946, 77 y.o.   MRN: [id number]  This visit was conducted in person.  BP 130/76   Pulse 67   Temp 98 F (36.7 C)   Resp 16   Ht 5' 4.25" (1.632 m)   Wt 202 lb 6 oz (91.8 kg)   SpO2 100%   BMI 34.47 kg/m    CC:  Chief Complaint  Patient presents with   leg cramps    In left leg off and on thing but not a regular thing    Subjective:   HPI: [name] is a 77 y.o. female with history of DVT, diabetes, hypertension, atrial fibrillation, hypothyroidism, neuropathy secondary to diabetes presenting on 08/16/2022 for leg cramps (In left leg off and on thing but not a regular thing)   She reports she has been experiencing leg cramps in her left  and right leg off and on in the last several weeks. Has had similar in past.. had resolved for a while.  Had severe episode few days ago in left calf and foot up to knee. Treated with stretching.  She tried tonic water, mustard and worstershire sauce and massage.  No swelling in left calf.  No falls.  Last K and TSH nml in 04/2022   Drinking lots of water.... It is what I usuallyd but this week hadrank less.   Not currently taking lasix now.  Planning on starting chair Yoga class.  Relevant past medical, surgical, family and social history reviewed and updated as indicated. Interim medical history since our last visit reviewed. Allergies and medications reviewed and updated. Outpatient Medications Prior to Visit  Medication Sig Dispense Refill   acetaminophen (TYLENOL) 500 MG tablet Take 500-1,000 mg by mouth every 6 (six) hours as needed for moderate pain.     acetaminophen-codeine (TYLENOL #3) 300-30 MG tablet Take 1-2 tablets by mouth every 6 (six) hours as needed for moderate pain. 30 tablet 0   albuterol (VENTOLIN HFA) 108 (90 Base) MCG/ACT inhaler INHALE 2 PUFFS BY MOUTH EVERY 6 HOURS AS NEEDED FOR WHEEZING OR SHORTNESS OF BREATH 18 g 1   anastrozole (ARIMIDEX) 1 MG  tablet TAKE 1 TABLET(1 MG) BY MOUTH DAILY 90 tablet 0   Artificial Saliva (BIOTENE DRY MOUTH) LOZG Use as directed 1 lozenge in the mouth or throat daily as needed (dry mouth).     atorvastatin (LIPITOR) 10 MG tablet TAKE 1 TABLET(10 MG) BY MOUTH DAILY 90 tablet 3   Blood Glucose Monitoring Suppl (ONETOUCH VERIO) w/Device KIT Use to check blood sugar up to 2 times a day 1 kit 0   Coenzyme Q10 (COQ10) 100 MG CAPS Take 100 mg by mouth daily.     cyclobenzaprine (FLEXERIL) 10 MG tablet Take 0.5-1 tablets (5-10 mg total) by mouth at bedtime as needed for muscle spasms. 20 tablet 0   desloratadine (CLARINEX) 5 MG tablet Take 1 tablet (5 mg total) by mouth daily. 90 tablet 1   Empagliflozin-linaGLIPtin (GLYXAMBI) 10-5 MG TABS Take 1 tablet by mouth daily. 30 tablet 5   EPINEPHrine 0.3 mg/0.3 mL IJ SOAJ injection USE AS DIRECTED FOR LIFE THREATENING ALLERGIC REACTIONS 2 each 2   estradiol (ESTRACE) 0.1 MG/GM vaginal cream Place 1 Applicatorful vaginally 2 (two) times a week. (As Needed)     famotidine (PEPCID) 40 MG tablet Take 1 (ONE) tablet by mouth every evening. 90 tablet 1   fluticasone (FLOVENT HFA) 220 MCG/ACT inhaler Inhale 2  puffs into the lungs 2 (two) times daily as needed (shortness of breath).     fluticasone-salmeterol (ADVAIR HFA) 230-21 MCG/ACT inhaler 2 inhalations 1-2 times a day depending on disease activity 12 g 5   gabapentin (NEURONTIN) 600 MG tablet TAKE 1 TABLET BY MOUTH EVERY DAY AT BREAKFAST, 1 TABLET AT LUNCH AND 2 TABLETS AT BEDTIME 360 tablet 1   glucose blood (ONETOUCH VERIO) test strip 1 strip 2 (two) times daily     hydrOXYzine (ATARAX/VISTARIL) 10 MG tablet Take 1 tablet (10 mg total) by mouth daily as needed for itching. 30 tablet 2   ipratropium (ATROVENT) 0.06 % nasal spray 1-2 sprays in each nostril 1-2 times per day as needed to dry nose 15 mL 5   irbesartan (AVAPRO) 150 MG tablet TAKE 1 TABLET(150 MG) BY MOUTH DAILY 90 tablet 2   Lactase (DAIRY-RELIEF PO) Take 1  capsule by mouth daily as needed (eating dairy).      Lancets (ONETOUCH DELICA PLUS [id number]) MISC USE TO CHECK BLOOD SUGAR UP TO TWICE DAILY 100 each 5   levalbuterol (XOPENEX) 1.25 MG/3ML nebulizer solution USE 1 VIAL VIA NEBULIZER EVERY 6 HOURS AS NEEDED FOR SHORTNESS OF BREATH OR WHEEZING 90 mL 3   liver oil-zinc oxide (DESITIN) 40 % ointment Apply 1 application topically as needed for irritation.     Menthol, Topical Analgesic, (ABSORBINE PLUS JR EX) Apply 1 patch topically daily as needed (pain).     Menthol-Methyl Salicylate (SALONPAS JET SPRAY EX) Apply 1 spray topically daily as needed (knee pain).     METAMUCIL FIBER PO Take 1 Dose by mouth daily. 1 dose = 1/2 tablespoon     methimazole (TAPAZOLE) 5 MG tablet Take 1 tablet (5 mg total) by mouth every other day. 45 tablet 3   montelukast (SINGULAIR) 10 MG tablet Take 1 tablet (10 mg total) by mouth at bedtime. For asthma control. 90 tablet 1   Mouthwashes (BIOTENE DRY MOUTH MT) Use as directed 1 Dose in the mouth or throat daily as needed (dry mouth).     nystatin (MYCOSTATIN) 100000 UNIT/ML suspension 61m swish and swallow after Advair 2 times daily. 473 mL 5   ondansetron (ZOFRAN-ODT) 8 MG disintegrating tablet Take 1 tablet (8 mg total) by mouth every 8 (eight) hours as needed for nausea or vomiting. 20 tablet 0   ONETOUCH VERIO test strip USE TO CHECK BLOOD SUGAR UP TO TWICE DAILY AS DIRECTED 100 strip 5   oxybutynin (DITROPAN-XL) 10 MG 24 hr tablet Take 10 mg by mouth at bedtime.     pantoprazole (PROTONIX) 40 MG tablet Take 1 (ONE) tablet by mouth every morning. 90 tablet 1   Plecanatide (TRULANCE) 3 MG TABS Take 3 mg by mouth daily as needed (constipation).     Polyethyl Glycol-Propyl Glycol (SYSTANE OP) Place 1 drop into both eyes daily as needed (dry eyes).     polyethylene glycol powder (GLYCOLAX/MIRALAX) 17 GM/SCOOP powder Take 1 Container by mouth once.     PRESCRIPTION MEDICATION Inhale into the lungs at bedtime. CPAP      Tiotropium Bromide Monohydrate (SPIRIVA RESPIMAT) 1.25 MCG/ACT AERS Inhale 2 each into the lungs daily.     Tiotropium Bromide Monohydrate (SPIRIVA RESPIMAT) 2.5 MCG/ACT AERS 1 inhalations 1 time per day. 12 g 2   tolterodine (DETROL LA) 4 MG 24 hr capsule Take 4 mg by mouth at bedtime.     traMADol (ULTRAM) 50 MG tablet Take 1-2 tablets (50-100 mg total) by mouth every  6 (six) hours as needed for moderate pain or severe pain (50 mg for moderate, 100 mg for severe). 20 tablet 0   traMADol (ULTRAM) 50 MG tablet Take 1 tablet (50 mg total) by mouth every 6 (six) hours as needed. 20 tablet 0   XARELTO 20 MG TABS tablet TAKE 1 TABLET(20 MG) BY MOUTH DAILY WITH SUPPER 90 tablet 1   MAGNESIUM GLYCINATE PO Take 2 tablets by mouth 2 (two) times daily.     Facility-Administered Medications Prior to Visit  Medication Dose Route Frequency Provider Last Rate Last Admin   omalizumab Arvid Right) injection 300 mg  300 mg Subcutaneous Q28 days [name], MD   300 mg at 07/24/22 1033     Per HPI unless specifically indicated in ROS section below Review of Systems  Constitutional:  Negative for fatigue and fever.  HENT:  Negative for congestion.   Eyes:  Negative for pain.  Respiratory:  Negative for cough and shortness of breath.   Cardiovascular:  Negative for chest pain, palpitations and leg swelling.  Gastrointestinal:  Negative for abdominal pain.  Genitourinary:  Negative for dysuria and vaginal bleeding.  Musculoskeletal:  Negative for back pain.  Neurological:  Negative for syncope, light-headedness and headaches.  Psychiatric/Behavioral:  Negative for dysphoric mood.    Objective:  BP 130/76   Pulse 67   Temp 98 F (36.7 C)   Resp 16   Ht 5' 4.25" (1.632 m)   Wt 202 lb 6 oz (91.8 kg)   SpO2 100%   BMI 34.47 kg/m   Wt Readings from Last 3 Encounters:  08/16/22 202 lb 6 oz (91.8 kg)  08/14/22 199 lb 4 oz (90.4 kg)  07/02/22 200 lb 9.6 oz (91 kg)      Physical Exam Constitutional:       General: She is not in acute distress.    Appearance: Normal appearance. She is well-developed. She is not ill-appearing or toxic-appearing.  HENT:     Head: Normocephalic.     Right Ear: Hearing, tympanic membrane, ear canal and external ear normal. Tympanic membrane is not erythematous, retracted or bulging.     Left Ear: Hearing, tympanic membrane, ear canal and external ear normal. Tympanic membrane is not erythematous, retracted or bulging.     Nose: No mucosal edema or rhinorrhea.     Right Sinus: No maxillary sinus tenderness or frontal sinus tenderness.     Left Sinus: No maxillary sinus tenderness or frontal sinus tenderness.     Mouth/Throat:     Pharynx: Uvula midline.  Eyes:     General: Lids are normal. Lids are everted, no foreign bodies appreciated.     Conjunctiva/sclera: Conjunctivae normal.     Pupils: Pupils are equal, round, and reactive to light.  Neck:     Thyroid: No thyroid mass or thyromegaly.     Vascular: No carotid bruit.     Trachea: Trachea normal.  Cardiovascular:     Rate and Rhythm: Normal rate and regular rhythm.     Pulses: Normal pulses.     Heart sounds: Normal heart sounds, S1 normal and S2 normal. No murmur heard.    No friction rub. No gallop.  Pulmonary:     Effort: Pulmonary effort is normal. No tachypnea or respiratory distress.     Breath sounds: Normal breath sounds. No decreased breath sounds, wheezing, rhonchi or rales.  Abdominal:     General: Bowel sounds are normal.     Palpations: Abdomen is  soft.     Tenderness: There is no abdominal tenderness.  Musculoskeletal:     Cervical back: Normal range of motion and neck supple.     Right knee: Swelling, deformity and bony tenderness present. Decreased range of motion. No tenderness.     Left knee: No bony tenderness. Normal range of motion. No tenderness.     Comments: Right leg with 1+ peripheral edema likely related to past knee surgery, left leg without calf tenderness or  swelling  Skin:    General: Skin is warm and dry.     Findings: No rash.  Neurological:     Mental Status: She is alert.  Psychiatric:        Mood and Affect: Mood is not anxious or depressed.        Speech: Speech normal.        Behavior: Behavior normal. Behavior is cooperative.        Thought Content: Thought content normal.        Judgment: Judgment normal.       Results for orders placed or performed in visit on 06/20/22  T3, free  Result Value Ref Range   T3, Free 2.8 2.3 - 4.2 pg/mL  T4, free  Result Value Ref Range   Free T4 0.77 0.60 - 1.60 ng/dL  TSH  Result Value Ref Range   TSH 2.96 0.35 - 5.50 uIU/mL   *Note: Due to a large number of results and/or encounters for the requested time period, some results have not been displayed. A complete set of results can be found in Results Review.    Assessment and Plan  Leg cramps Assessment & Plan: Acute flare of intermittent chronic issue Increase water intake.  Patient no longer on Lasix so it is unlikely potassium low given normal potassium levels in the fall at last check. Thyroid testing normal in October 2023.  Agree with initiation of yoga and gentle stretching bilaterally.     Return if symptoms worsen or fail to improve.   [name], MD

## 2022-08-17 ENCOUNTER — Telehealth: Payer: Self-pay | Admitting: Family Medicine

## 2022-08-17 NOTE — Telephone Encounter (Signed)
[name] notified that we have not received any MyChart messages from her today.  Patient will try and send again.

## 2022-08-17 NOTE — Assessment & Plan Note (Signed)
Acute flare of intermittent chronic issue Increase water intake.  Patient no longer on Lasix so it is unlikely potassium low given normal potassium levels in the fall at last check. Thyroid testing normal in October 2023.  Agree with initiation of yoga and gentle stretching bilaterally.

## 2022-08-17 NOTE — Telephone Encounter (Signed)
Patient called and asked did Dr. Diona Browner get her message about medication.

## 2022-08-18 ENCOUNTER — Encounter: Payer: Self-pay | Admitting: Family Medicine

## 2022-08-20 ENCOUNTER — Other Ambulatory Visit: Payer: Self-pay | Admitting: Podiatry

## 2022-08-21 ENCOUNTER — Ambulatory Visit (INDEPENDENT_AMBULATORY_CARE_PROVIDER_SITE_OTHER): Payer: Medicare Other

## 2022-08-21 ENCOUNTER — Encounter: Payer: Self-pay | Admitting: Family Medicine

## 2022-08-21 DIAGNOSIS — J4550 Severe persistent asthma, uncomplicated: Secondary | ICD-10-CM

## 2022-08-22 ENCOUNTER — Telehealth: Payer: Self-pay

## 2022-08-22 ENCOUNTER — Telehealth: Payer: Self-pay | Admitting: Podiatry

## 2022-08-22 NOTE — Telephone Encounter (Signed)
error 

## 2022-08-23 ENCOUNTER — Other Ambulatory Visit: Payer: Medicare Other

## 2022-08-27 ENCOUNTER — Ambulatory Visit (INDEPENDENT_AMBULATORY_CARE_PROVIDER_SITE_OTHER): Payer: Medicare Other

## 2022-08-27 DIAGNOSIS — M2042 Other hammer toe(s) (acquired), left foot: Secondary | ICD-10-CM | POA: Diagnosis not present

## 2022-08-27 DIAGNOSIS — M2041 Other hammer toe(s) (acquired), right foot: Secondary | ICD-10-CM

## 2022-08-27 DIAGNOSIS — E1142 Type 2 diabetes mellitus with diabetic polyneuropathy: Secondary | ICD-10-CM | POA: Diagnosis not present

## 2022-08-27 NOTE — Progress Notes (Signed)
Patient presents today to pick up diabetic shoes and insoles.  Patient was dispensed 1 pair of diabetic shoes and 3 pairs of foam casted diabetic insoles.   She tried on the shoes with the insoles and the fit was satisfactory.   Will follow up next year for new order.

## 2022-08-28 DIAGNOSIS — Z7901 Long term (current) use of anticoagulants: Secondary | ICD-10-CM | POA: Diagnosis not present

## 2022-08-28 DIAGNOSIS — Z86010 Personal history of colonic polyps: Secondary | ICD-10-CM | POA: Diagnosis not present

## 2022-08-28 DIAGNOSIS — K5901 Slow transit constipation: Secondary | ICD-10-CM | POA: Diagnosis not present

## 2022-08-31 NOTE — Telephone Encounter (Signed)
No further information is needed from me.

## 2022-09-04 ENCOUNTER — Ambulatory Visit (INDEPENDENT_AMBULATORY_CARE_PROVIDER_SITE_OTHER): Payer: Medicare Other | Admitting: Family Medicine

## 2022-09-04 ENCOUNTER — Telehealth: Payer: Self-pay | Admitting: Allergy and Immunology

## 2022-09-04 ENCOUNTER — Encounter: Payer: Self-pay | Admitting: Family Medicine

## 2022-09-04 VITALS — BP 110/66 | HR 91 | Temp 98.2°F | Ht 64.25 in | Wt 193.0 lb

## 2022-09-04 DIAGNOSIS — R5383 Other fatigue: Secondary | ICD-10-CM

## 2022-09-04 DIAGNOSIS — R197 Diarrhea, unspecified: Secondary | ICD-10-CM

## 2022-09-04 DIAGNOSIS — L299 Pruritus, unspecified: Secondary | ICD-10-CM | POA: Insufficient documentation

## 2022-09-04 NOTE — Patient Instructions (Addendum)
Talk with podiatry about cutting back on gabapentin as it could be causing sedation.  Start cetaphil or aquarphor cream to moisturize skin.  Keep up with water intake.  Keep appt with dermatology.

## 2022-09-04 NOTE — Assessment & Plan Note (Signed)
Likely multifactorial due to multiple sedating medications.  She will discuss with podiatry about reduction of gabapentin dose.

## 2022-09-04 NOTE — Assessment & Plan Note (Signed)
Acute, self-limited episode.  Likely secondary to diet change versus viral illness. Now resolved

## 2022-09-04 NOTE — Assessment & Plan Note (Signed)
Chronic issue Likely in part due to medication side effect as well as dry skin.  Encouraged her to daily hydrating gastritis or an increase food intake.

## 2022-09-04 NOTE — Telephone Encounter (Signed)
Patient called back - read out to her Dr. Bruna Potter message.   Patient verbalized understanding and expressed her gratitude.

## 2022-09-04 NOTE — Telephone Encounter (Signed)
I called the patient and left a message for her to call the [facility] office back to go over the note from Dr. Neldon Mc.

## 2022-09-04 NOTE — Progress Notes (Signed)
Patient ID: [name], female    DOB: 12-May-1946, 77 y.o.   MRN: [id number]  This visit was conducted in person.  BP 110/66   Pulse 91   Temp 98.2 F (36.8 C) (Temporal)   Ht 5' 4.25" (1.632 m)   Wt 193 lb (87.5 kg)   SpO2 97%   BMI 32.87 kg/m    CC:  Chief Complaint  Patient presents with   Diarrhea    X 2 days    Sleep Issues   Itching All Over    Worse at night   Nasal Congestion    Subjective:   HPI: [name] is a 77 y.o. female presenting on 09/04/2022 for Diarrhea (X 2 days/), Sleep Issues, Itching All Over (Worse at night), and Nasal Congestion   Has noted diarrhea x 2 days.. , then no further.. she has been eating less/bland foods.  No blood in stool. No abdominal pain.    Itching across low back.. more later in the day.. has use hydroxyzine prn for ithcing.. She has appt next month with Dermatology..  Not moisturizing. Trouble sleeping giving the itching and trouble relaxing.  Feeling more sleepy during the day  New meds: now on  Arimidex. Since 07/11/22 Felt better with getting fresh air yesterday.   Taking gabapentin for foot pain from neuropathy per podiatry. Taking 600 mg  in Am, afternoon and 2 at bedtime.       Relevant past medical, surgical, family and social history reviewed and updated as indicated. Interim medical history since our last visit reviewed. Allergies and medications reviewed and updated. Outpatient Medications Prior to Visit  Medication Sig Dispense Refill   acetaminophen (TYLENOL) 500 MG tablet Take 500-1,000 mg by mouth every 6 (six) hours as needed for moderate pain.     acetaminophen-codeine (TYLENOL #3) 300-30 MG tablet Take 1-2 tablets by mouth every 6 (six) hours as needed for moderate pain. 30 tablet 0   albuterol (VENTOLIN HFA) 108 (90 Base) MCG/ACT inhaler INHALE 2 PUFFS BY MOUTH EVERY 6 HOURS AS NEEDED FOR WHEEZING OR SHORTNESS OF BREATH 18 g 1   anastrozole (ARIMIDEX) 1 MG tablet TAKE 1 TABLET(1 MG) BY  MOUTH DAILY 90 tablet 0   Artificial Saliva (BIOTENE DRY MOUTH) LOZG Use as directed 1 lozenge in the mouth or throat daily as needed (dry mouth).     atorvastatin (LIPITOR) 10 MG tablet TAKE 1 TABLET(10 MG) BY MOUTH DAILY 90 tablet 3   Blood Glucose Monitoring Suppl (ONETOUCH VERIO) w/Device KIT Use to check blood sugar up to 2 times a day 1 kit 0   CALCIUM PO Take 1,000 mg by mouth daily.     Coenzyme Q10 (COQ10) 100 MG CAPS Take 100 mg by mouth daily.     cyclobenzaprine (FLEXERIL) 10 MG tablet Take 0.5-1 tablets (5-10 mg total) by mouth at bedtime as needed for muscle spasms. 20 tablet 0   desloratadine (CLARINEX) 5 MG tablet Take 1 tablet (5 mg total) by mouth daily. 90 tablet 1   Empagliflozin-linaGLIPtin (GLYXAMBI) 10-5 MG TABS Take 1 tablet by mouth daily. 30 tablet 5   EPINEPHrine 0.3 mg/0.3 mL IJ SOAJ injection USE AS DIRECTED FOR LIFE THREATENING ALLERGIC REACTIONS 2 each 2   estradiol (ESTRACE) 0.1 MG/GM vaginal cream Place 1 Applicatorful vaginally 2 (two) times a week. (As Needed)     famotidine (PEPCID) 40 MG tablet Take 1 (ONE) tablet by mouth every evening. 90 tablet 1   fluticasone (FLOVENT  HFA) 220 MCG/ACT inhaler Inhale 2 puffs into the lungs 2 (two) times daily as needed (shortness of breath).     fluticasone-salmeterol (ADVAIR HFA) 230-21 MCG/ACT inhaler 2 inhalations 1-2 times a day depending on disease activity 12 g 5   gabapentin (NEURONTIN) 600 MG tablet TAKE 1 TABLET BY MOUTH EVERY DAY AT BREAKFAST, 1 TABLET AT LUNCH, AND 2 TABLETS AT BEDTIME 360 tablet 1   glucose blood (ONETOUCH VERIO) test strip 1 strip 2 (two) times daily     hydrOXYzine (ATARAX/VISTARIL) 10 MG tablet Take 1 tablet (10 mg total) by mouth daily as needed for itching. 30 tablet 2   ipratropium (ATROVENT) 0.06 % nasal spray 1-2 sprays in each nostril 1-2 times per day as needed to dry nose 15 mL 5   irbesartan (AVAPRO) 150 MG tablet TAKE 1 TABLET(150 MG) BY MOUTH DAILY 90 tablet 2   Lactase  (DAIRY-RELIEF PO) Take 1 capsule by mouth daily as needed (eating dairy).      Lancets (ONETOUCH DELICA PLUS [id number]) MISC USE TO CHECK BLOOD SUGAR UP TO TWICE DAILY 100 each 5   levalbuterol (XOPENEX) 1.25 MG/3ML nebulizer solution USE 1 VIAL VIA NEBULIZER EVERY 6 HOURS AS NEEDED FOR SHORTNESS OF BREATH OR WHEEZING 90 mL 3   liver oil-zinc oxide (DESITIN) 40 % ointment Apply 1 application topically as needed for irritation.     Magnesium 500 MG TABS Take 500 mg by mouth daily.     Menthol, Topical Analgesic, (ABSORBINE PLUS JR EX) Apply 1 patch topically daily as needed (pain).     Menthol-Methyl Salicylate (SALONPAS JET SPRAY EX) Apply 1 spray topically daily as needed (knee pain).     METAMUCIL FIBER PO Take 1 Dose by mouth daily. 1 dose = 1/2 tablespoon     methimazole (TAPAZOLE) 5 MG tablet Take 1 tablet (5 mg total) by mouth every other day. 45 tablet 3   montelukast (SINGULAIR) 10 MG tablet Take 1 tablet (10 mg total) by mouth at bedtime. For asthma control. 90 tablet 1   Mouthwashes (BIOTENE DRY MOUTH MT) Use as directed 1 Dose in the mouth or throat daily as needed (dry mouth).     nystatin (MYCOSTATIN) 100000 UNIT/ML suspension 25m swish and swallow after Advair 2 times daily. 473 mL 5   ondansetron (ZOFRAN-ODT) 8 MG disintegrating tablet Take 1 tablet (8 mg total) by mouth every 8 (eight) hours as needed for nausea or vomiting. 20 tablet 0   ONETOUCH VERIO test strip USE TO CHECK BLOOD SUGAR UP TO TWICE DAILY AS DIRECTED 100 strip 5   oxybutynin (DITROPAN-XL) 10 MG 24 hr tablet Take 10 mg by mouth at bedtime.     pantoprazole (PROTONIX) 40 MG tablet Take 1 (ONE) tablet by mouth every morning. 90 tablet 1   Plecanatide (TRULANCE) 3 MG TABS Take 3 mg by mouth daily as needed (constipation).     Polyethyl Glycol-Propyl Glycol (SYSTANE OP) Place 1 drop into both eyes daily as needed (dry eyes).     polyethylene glycol powder (GLYCOLAX/MIRALAX) 17 GM/SCOOP powder Take 1 Container by mouth  once.     PRESCRIPTION MEDICATION Inhale into the lungs at bedtime. CPAP     Tiotropium Bromide Monohydrate (SPIRIVA RESPIMAT) 1.25 MCG/ACT AERS Inhale 2 each into the lungs daily.     Tiotropium Bromide Monohydrate (SPIRIVA RESPIMAT) 2.5 MCG/ACT AERS 1 inhalations 1 time per day. 12 g 2   tolterodine (DETROL LA) 4 MG 24 hr capsule Take 4 mg by mouth  at bedtime.     traMADol (ULTRAM) 50 MG tablet Take 1 tablet (50 mg total) by mouth every 6 (six) hours as needed. 20 tablet 0   XARELTO 20 MG TABS tablet TAKE 1 TABLET(20 MG) BY MOUTH DAILY WITH SUPPER 90 tablet 1   Zinc 25 MG TABS Take 25 mg by mouth daily.     traMADol (ULTRAM) 50 MG tablet Take 1-2 tablets (50-100 mg total) by mouth every 6 (six) hours as needed for moderate pain or severe pain (50 mg for moderate, 100 mg for severe). 20 tablet 0   Facility-Administered Medications Prior to Visit  Medication Dose Route Frequency Provider Last Rate Last Admin   omalizumab Arvid Right) injection 300 mg  300 mg Subcutaneous Q28 days [name], MD   300 mg at 08/21/22 [id number]     Per HPI unless specifically indicated in ROS section below Review of Systems  Constitutional:  Positive for fatigue. Negative for fever.  HENT:  Negative for congestion.   Eyes:  Negative for pain.  Respiratory:  Negative for cough and shortness of breath.   Cardiovascular:  Negative for chest pain, palpitations and leg swelling.  Gastrointestinal:  Negative for abdominal pain.  Genitourinary:  Negative for dysuria and vaginal bleeding.  Musculoskeletal:  Negative for back pain.  Neurological:  Negative for syncope, light-headedness and headaches.  Psychiatric/Behavioral:  Negative for dysphoric mood.    Objective:  BP 110/66   Pulse 91   Temp 98.2 F (36.8 C) (Temporal)   Ht 5' 4.25" (1.632 m)   Wt 193 lb (87.5 kg)   SpO2 97%   BMI 32.87 kg/m   Wt Readings from Last 3 Encounters:  09/04/22 193 lb (87.5 kg)  08/16/22 202 lb 6 oz (91.8 kg)  08/14/22 199 lb  4 oz (90.4 kg)      Physical Exam Constitutional:      General: She is not in acute distress.    Appearance: Normal appearance. She is well-developed. She is not ill-appearing or toxic-appearing.  HENT:     Head: Normocephalic.     Right Ear: Hearing, tympanic membrane, ear canal and external ear normal. Tympanic membrane is not erythematous, retracted or bulging.     Left Ear: Hearing, tympanic membrane, ear canal and external ear normal. Tympanic membrane is not erythematous, retracted or bulging.     Nose: No mucosal edema or rhinorrhea.     Right Sinus: No maxillary sinus tenderness or frontal sinus tenderness.     Left Sinus: No maxillary sinus tenderness or frontal sinus tenderness.     Mouth/Throat:     Pharynx: Uvula midline.  Eyes:     General: Lids are normal. Lids are everted, no foreign bodies appreciated.     Conjunctiva/sclera: Conjunctivae normal.     Pupils: Pupils are equal, round, and reactive to light.  Neck:     Thyroid: No thyroid mass or thyromegaly.     Vascular: No carotid bruit.     Trachea: Trachea normal.  Cardiovascular:     Rate and Rhythm: Normal rate and regular rhythm.     Pulses: Normal pulses.     Heart sounds: Normal heart sounds, S1 normal and S2 normal. No murmur heard.    No friction rub. No gallop.  Pulmonary:     Effort: Pulmonary effort is normal. No tachypnea or respiratory distress.     Breath sounds: Normal breath sounds. No decreased breath sounds, wheezing, rhonchi or rales.  Abdominal:  General: Bowel sounds are normal.     Palpations: Abdomen is soft.     Tenderness: There is no abdominal tenderness.  Musculoskeletal:     Cervical back: Normal range of motion and neck supple.  Skin:    General: Skin is warm and dry.     Findings: No rash.  Neurological:     Mental Status: She is alert.  Psychiatric:        Mood and Affect: Mood is not anxious or depressed.        Speech: Speech normal.        Behavior: Behavior normal.  Behavior is cooperative.        Thought Content: Thought content normal.        Judgment: Judgment normal.       Results for orders placed or performed in visit on 06/20/22  T3, free  Result Value Ref Range   T3, Free 2.8 2.3 - 4.2 pg/mL  T4, free  Result Value Ref Range   Free T4 0.77 0.60 - 1.60 ng/dL  TSH  Result Value Ref Range   TSH 2.96 0.35 - 5.50 uIU/mL   *Note: Due to a large number of results and/or encounters for the requested time period, some results have not been displayed. A complete set of results can be found in Results Review.    Assessment and Plan  Acute diarrhea Assessment & Plan: Acute, self-limited episode.  Likely secondary to diet change versus viral illness. Now resolved   Itching Assessment & Plan: Chronic issue Likely in part due to medication side effect as well as dry skin.  Encouraged her to daily hydrating gastritis or an increase food intake.   Other fatigue Assessment & Plan: Likely multifactorial due to multiple sedating medications.  She will discuss with podiatry about reduction of gabapentin dose.     No follow-ups on file.   [name], MD

## 2022-09-04 NOTE — Telephone Encounter (Signed)
Patient called and would like to talk with dr Neldon Mc about her been put under for surgery on 09/27/[phone]

## 2022-09-04 NOTE — Telephone Encounter (Signed)
Patient has a Colonoscopy Scheduled for 09/27/22 and is to be under for this procedure. She called due Dr. Neldon Mc stating she should not use anesthetics. Patient would like to know if Dr. Neldon Mc will give her or or Doctor a call on how to proceed.

## 2022-09-04 NOTE — Telephone Encounter (Signed)
Noted thank you

## 2022-09-05 ENCOUNTER — Ambulatory Visit (INDEPENDENT_AMBULATORY_CARE_PROVIDER_SITE_OTHER): Payer: Medicare Other | Admitting: Podiatry

## 2022-09-05 ENCOUNTER — Other Ambulatory Visit: Payer: Self-pay | Admitting: Family Medicine

## 2022-09-05 ENCOUNTER — Encounter: Payer: Self-pay | Admitting: Podiatry

## 2022-09-05 DIAGNOSIS — D2371 Other benign neoplasm of skin of right lower limb, including hip: Secondary | ICD-10-CM | POA: Diagnosis not present

## 2022-09-05 DIAGNOSIS — E1142 Type 2 diabetes mellitus with diabetic polyneuropathy: Secondary | ICD-10-CM | POA: Diagnosis not present

## 2022-09-05 DIAGNOSIS — D2372 Other benign neoplasm of skin of left lower limb, including hip: Secondary | ICD-10-CM

## 2022-09-05 MED ORDER — GABAPENTIN 300 MG PO CAPS: ORAL_CAPSULE | ORAL | 3 refills | Status: DC

## 2022-09-05 NOTE — Progress Notes (Signed)
She presents today stating that she is concerned about her hallux left with a callus on the end and the fact that it bends and that it rubs her shoe.  She wants to know if there is any way to shorten that big toe back a little bit so that she can wear better shoes.  She states that the toe aches.  She is also goes on to say that she is unable to take the gabapentin so she just stopped taking it she stated that she had no side effects when she stopped taking it she is taking 600 in the morning 600 and lunchtime and 1200 at bedtime.  She states that it made her feel like she was going to fall asleep while she was driving back from [place].  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  No change in physical exam otherwise.  Assessment: Benign skin lesions bilateral hallux malleus left foot distal clavus and multiple benign skin lesions bilateral.  No open lesions or wounds are noted.  Plan: Debrided benign skin lesions for her today discussed briefly interphalangeal joint fusion with resection of bone to shorten the big toe she understands this can take at least 12 weeks for her to heal if everything is optimal.  She states that she is going to have to try to plan that will let us know when she needs this.  We also changed her gabapentin to gabapentin 300 mg capsules 1 capsule in the morning 1 capsule at lunch and 2 capsules at bedtime for a total of a total of 1200 mg daily.

## 2022-09-07 ENCOUNTER — Telehealth: Payer: Self-pay

## 2022-09-07 ENCOUNTER — Ambulatory Visit: Payer: Self-pay

## 2022-09-07 NOTE — Patient Outreach (Signed)
  Care Coordination   Follow Up Visit Note   09/07/2022 Name: SULAF FINLEY MRN: [id number] DOB: 1945/12/07  LIELA ALDERS is a 77 y.o. year old female who sees [name], MD for primary care. I spoke with  [name] by phone today.  What matters to the patients health and wellness today?  Patient tearful at time of call with audible rapid breathing /breath sounds.  Patient reports she feels scared at night and can't sleep.  She states one of her friends recently was put in rehab and its been difficult seeing her friend this way.  Patient states she is not sure why over the last several nights she's been feeling afraid. She reports living alone but states she's lived alone for a long time.  Patient states she occasionally feels overwhelmed.  Patient states she does not understand why she is feeling like this today.  Patient states she has a daughter that lives in [place]. She states they talk frequently.  Completed PHQ2 score was 1.     Goals Addressed             This Visit's Progress    management of feelings of anxiety       Interventions Today    Flowsheet Row Most Recent Value  Chronic Disease   Chronic disease during today's visit Other  [Anxiety like symptoms:  Advised patient to take slow, deep breaths, Guided patient in focusing on the present. Remained with patient on phone until contact made with neighbor/friend. Encouraged patient with supportive conversation.]  General Interventions   General Interventions Discussed/Reviewed General Interventions Discussed, Communication with, Doctor Visits  [Active listening and support.  Patient advised to call neighbor/friend to come to home for additional support.  Patient advised to call therapist for an appointment.]  Doctor Visits Discussed/Reviewed Specialist  Communication with Social Work  [place] Discussed/Reviewed Anxiety  [Collaborated with social worker, [place] to discuss  immediate symptom management and possible need for Education officer, museum referral.]  Pharmacy Interventions   Pharmacy Dicussed/Reviewed Pharmacy Topics Reviewed  [Reviewed patients medications.  Advised patient to take her  medication Hydroxyzine as prescribed by her doctor.]      Patient advised to notify provider for ongoing symptoms Completed PHQ2        SDOH assessments and interventions completed:  No     Care Coordination Interventions:  Yes, provided   Follow up plan: Follow up call scheduled for 09/28/22    Encounter Outcome:  Pt. Visit Completed

## 2022-09-07 NOTE — Patient Outreach (Signed)
  Care Coordination   Follow Up Visit Note   09/07/2022 Name: [name] MRN: [id number] DOB: 1945/11/29  [name] is a 77 y.o. year old female who sees [name], MD for primary care. I spoke with  [name] by phone today.  What matters to the patients health and wellness today?  Patient states she is feeling better. She states her neighbor took her out to run errands and lunch.  She reports picking up pills from vitamin store.  Patient states she has not heard back from there therapist office since leaving a message this morning.     Goals Addressed             This Visit's Progress    management of feelings of anxiety       Interventions Today    Flowsheet Row Most Recent Value  Chronic Disease   Chronic disease during today's visit Other  [Anxiety like symptoms]  General Interventions   General Interventions Discussed/Reviewed General Interventions Reviewed  [Returned call to patient to follow up on anxiety like symptoms.  Patient advised to call RNCM if she does not hear back from therapist office.]              SDOH assessments and interventions completed:  No     Care Coordination Interventions:  Yes, provided   Follow up plan: Follow up call scheduled for 09/28/22    Encounter Outcome:  Pt. Visit Completed   Quinn Plowman RN,BSN,CCM [place] [phone] direct line

## 2022-09-10 ENCOUNTER — Other Ambulatory Visit (HOSPITAL_COMMUNITY): Payer: Self-pay

## 2022-09-10 ENCOUNTER — Other Ambulatory Visit: Payer: Self-pay

## 2022-09-10 MED ORDER — LEVALBUTEROL HCL 1.25 MG/3ML IN NEBU: INHALATION_SOLUTION | RESPIRATORY_TRACT | 3 refills | Status: DC

## 2022-09-12 ENCOUNTER — Ambulatory Visit: Payer: Self-pay | Admitting: *Deleted

## 2022-09-12 ENCOUNTER — Telehealth: Payer: Self-pay

## 2022-09-12 NOTE — Patient Outreach (Signed)
  Care Coordination   Initial Visit Note   09/12/2022 Name: [name] MRN: [id number] DOB: Sep 21, 1945  [name] is a 77 y.o. year old female who sees [name], MD for primary care. I spoke with  [name] by phone today.  What matters to the patients health and wellness today? Managing anxiety-patient states being unable to identify any sort of trigger to her anxiety, last week's episode was a sudden onset. Patient states that she was able to have a friend to come by which helped to reduce her symptoms. She is also ensuring that she gets enough rest and is encouraged by reading her bible. Patient able to verbalize having a positive support system to rely on and is active in church which seems to improve her mood. She has also began going to chair yoga and is  open to starting mental health treatment again as well.    Goals Addressed             This Visit's Progress    Managing anxiety       Care Coordination Interventions:  PHQ2/PHQ9 completed Solution-Focused Strategies employed:  Active listening / Reflection utilized  Emotional Support Provided Participation in counseling encouraged  Discussed self-care action plan:          SDOH assessments and interventions completed:  Yes  SDOH Interventions Today    Flowsheet Row Most Recent Value  SDOH Interventions   Food Insecurity Interventions Intervention Not Indicated  Housing Interventions Intervention Not Indicated  Transportation Interventions Intervention Not Indicated  Financial Strain Interventions Intervention Not Indicated        Care Coordination Interventions:  Yes, provided  Interventions Today    Flowsheet Row Most Recent Value  Chronic Disease   Chronic disease during today's visit Other  [anxiety symptoms]  General Interventions   General Interventions Discussed/Reviewed General Interventions Discussed, Management consultant with RN  [place] Discussed/Reviewed Other  [ Discussed self-care action plan: use of relaxation activities, making sleep a priority, maintaining a healthy diet, identifying triggers with journaling, continued participation in social activities and follow up with ongoing mental health counseling]       Follow up plan: Follow up call scheduled for 09/21/22    Encounter Outcome:  Pt. Visit Completed

## 2022-09-12 NOTE — Patient Instructions (Signed)
Visit Information  Thank you for taking time to visit with me today. Please don't hesitate to contact me if I can be of assistance to you.   Following are the goals we discussed today:  Social worker to call.  Our next appointment is by telephone on 09/28/22 at 11am  Please call the care guide team at [phone] if you need to cancel or reschedule your appointment.   If you are experiencing a Mental Health or [place] or need someone to talk to, please call the Suicide and Crisis Lifeline: 988 call 1-800-273-TALK (toll free, 24 hour hotline)  Patient verbalizes understanding of instructions and care plan provided today and agrees to view in [place]. Active MyChart status and patient understanding of how to access instructions and care plan via MyChart confirmed with patient.     Quinn Plowman RN,BSN,CCM [place] Coordination [phone] direct line

## 2022-09-12 NOTE — Patient Outreach (Signed)
  Care Coordination   Follow Up Visit Note   09/12/2022 Name: MERELENE THUNBERG MRN: [id number] DOB: 1945/10/30  [name] is a 77 y.o. year old female who sees [name], MD for primary care. I spoke with  [name] by phone today.  What matters to the patients health and wellness today?  Patient states she would like to have a follow up call with social worker to discuss anxiety symptoms.      Goals Addressed             This Visit's Progress    management of feelings of anxiety       Interventions Today    Flowsheet Row Most Recent Value  Chronic Disease   Chronic disease during today's visit Other  [anxiety like symptoms]  General Interventions   General Interventions Discussed/Reviewed General Interventions Reviewed  Communication with Social Work  [Patient referred to Education officer, museum regarding ongoing anxiety symptoms. Collaborated with social worker regarding patients reported symptoms.]     Provided patient 13 phone number for suicide, crisis line number.         SDOH assessments and interventions completed:  No     Care Coordination Interventions:  Yes, provided   Follow up plan: Follow up call scheduled for as previously scheduled    Encounter Outcome:  Pt. Visit Completed {THN Tip this will not be part of the note when signed-REQUIRED REPORT FIELD DO NOT DELETE (Optional):[id number]  Quinn Plowman RN,BSN,CCM [place] [phone] direct line

## 2022-09-12 NOTE — Patient Instructions (Signed)
Visit Information  Thank you for taking time to visit with me today. Please don't hesitate to contact me if I can be of assistance to you.   Following are the goals we discussed today:   Goals Addressed             This Visit's Progress    Managing anxiety       Care Coordination Interventions:  PHQ2/PHQ9 completed Solution-Focused Strategies employed:  Active listening / Reflection utilized  Emotional Support Provided Participation in counseling encouraged  Discussed self-care action plan:          Our next appointment is by telephone on 09/21/22 at 3pm  Please call the care guide team at [phone] if you need to cancel or reschedule your appointment.   If you are experiencing a Mental Health or [place] or need someone to talk to, please call the Suicide and Crisis Lifeline: 988   Patient verbalizes understanding of instructions and care plan provided today and agrees to view in [place]. Active MyChart status and patient understanding of how to access instructions and care plan via MyChart confirmed with patient.     Telephone follow up appointment with care management team member scheduled for:09/21/22  Elliot Gurney, [place] Worker  [facility] Care Management [phone]

## 2022-09-13 ENCOUNTER — Telehealth: Payer: Self-pay

## 2022-09-13 ENCOUNTER — Telehealth: Payer: Self-pay | Admitting: Adult Health

## 2022-09-13 NOTE — Telephone Encounter (Signed)
Pt is asking for a call to discuss what the red face on her CPAP means.  Phone rep suggested reviewing her manual or even calling the DME.  Pt is asking for a call from RN to discuss this. Pt aware the response time to messages is 24-48 hours

## 2022-09-13 NOTE — Telephone Encounter (Signed)
I called pt and LMVM for her that she may best be served by the DME [employer] about that question of red face (machine/phone app)? May be not using 4 hours that night.   I left her the [phone]. But she is welcome to call back if needed,

## 2022-09-14 ENCOUNTER — Encounter: Payer: Medicare Other | Admitting: *Deleted

## 2022-09-14 NOTE — Patient Outreach (Signed)
  Care Coordination   Follow Up Visit Note   09/14/2022 Name: [name] MRN: [id number] DOB: 01-06-1946  [name] is a 77 y.o. year old female who sees [name], MD for primary care. I spoke with  [name] by phone today.  What matters to the patients health and wellness today?  Patient reports having " good " conversation with Education officer, museum on yesterday.  States she is willing and desires to have counseling as discussed.  Patient voiced appreciation of the follow up.     Goals Addressed             This Visit's Progress    management of feelings of anxiety       Interventions Today    Flowsheet Row Most Recent Value  Chronic Disease   Chronic disease during today's visit Other  [anxiety like symptoms.]  General Interventions   General Interventions Discussed/Reviewed General Interventions Reviewed  [Called to confirm patient had follow up with social worker and to provide [phone] number to patient in the event she experience increase anxiety symptoms.]              SDOH assessments and interventions completed:  No     Care Coordination Interventions:  Yes, provided   Follow up plan: Follow up call scheduled for as previously scheduled     Encounter Outcome:  Pt. Visit Completed   Quinn Plowman RN,BSN,CCM [place] [phone] direct line

## 2022-09-17 ENCOUNTER — Other Ambulatory Visit: Payer: Self-pay

## 2022-09-17 DIAGNOSIS — L298 Other pruritus: Secondary | ICD-10-CM | POA: Diagnosis not present

## 2022-09-18 ENCOUNTER — Ambulatory Visit (INDEPENDENT_AMBULATORY_CARE_PROVIDER_SITE_OTHER): Payer: Medicare Other

## 2022-09-18 DIAGNOSIS — J4550 Severe persistent asthma, uncomplicated: Secondary | ICD-10-CM | POA: Diagnosis not present

## 2022-09-19 ENCOUNTER — Inpatient Hospital Stay: Payer: Medicare Other | Admitting: Adult Health

## 2022-09-19 ENCOUNTER — Telehealth: Payer: Self-pay | Admitting: *Deleted

## 2022-09-19 NOTE — Patient Outreach (Signed)
Phone call from patient to re-schedule her appointment-appointment rescheduled to 10/10/22 9:30am

## 2022-09-20 ENCOUNTER — Telehealth: Payer: Self-pay | Admitting: *Deleted

## 2022-09-20 ENCOUNTER — Ambulatory Visit: Payer: Self-pay | Admitting: *Deleted

## 2022-09-20 NOTE — Patient Outreach (Signed)
  Care Coordination   Follow Up Visit Note   09/20/2022 Name: [name] MRN: [id number] DOB: May 13, 1946  [name] is a 77 y.o. year old female who sees [name], MD for primary care. I spoke with  [name] by phone today.  What matters to the patients health and wellness today?  Managing Anxiety, getting back into therapy    Goals Addressed             This Visit's Progress    Managing anxiety       Care Coordination Interventions:   Solution-Focused Strategies employed:  Active listening / Reflection utilized  Emotional Support Provided Participation in counseling encouraged-referral to be completed to [facility] Self-care action plan reinforced        SDOH assessments and interventions completed:  No     Care Coordination Interventions:  Yes, provided  Interventions Today    Flowsheet Row Most Recent Value  Chronic Disease   Chronic disease during today's visit Congestive Heart Failure (CHF), Hypertension (HTN), Other  [Anxiety]  General Interventions   General Interventions Discussed/Reviewed General Interventions Reviewed  Exercise Interventions   Exercise Discussed/Reviewed Exercise Reviewed  Derald Macleod yoga]  Mental Health Interventions   Mental Health Discussed/Reviewed Mental Health Reviewed, Coping Strategies, Anxiety  [Patient agreeable to referral to [facility]-continued use of postive coping strategies re-inforced]  Safety Interventions   Safety Discussed/Reviewed Safety Discussed  [patient to call [phone] in the event of a mental health crisis]       Follow up plan: Follow up call scheduled for 10/04/22    Encounter Outcome:  Pt. Visit Completed

## 2022-09-20 NOTE — Patient Instructions (Signed)
Visit Information  Thank you for taking time to visit with me today. Please don't hesitate to contact me if I can be of assistance to you.   Following are the goals we discussed today:   Goals Addressed             This Visit's Progress    Managing anxiety       Care Coordination Interventions:   Solution-Focused Strategies employed:  Active listening / Reflection utilized  Emotional Support Provided Participation in counseling encouraged-referral to be completed to [facility] Self-care action plan reinforced        Our next appointment is by telephone on 10/04/22 at 11am  Please call the care guide team at [phone] if you need to cancel or reschedule your appointment.   If you are experiencing a Mental Health or [place] or need someone to talk to, please call the Suicide and Crisis Lifeline: 988   Patient verbalizes understanding of instructions and care plan provided today and agrees to view in [place]. Active MyChart status and patient understanding of how to access instructions and care plan via MyChart confirmed with patient.     Telephone follow up appointment with care management team member scheduled for: 10/04/22  Elliot Gurney, [place] Worker  [facility] Care Management [phone]

## 2022-09-20 NOTE — Telephone Encounter (Signed)
   Pre-operative Risk Assessment    Patient Name: [name]  DOB: February 11, 1946 MRN: [id number]      Request for Surgical Clearance    Procedure:   Colonoscopy/Endoscopy  Date of Surgery:  Clearance 09/27/22                                 Surgeon:  Dr. Therisa Doyne Surgeon's Group or Practice Name:  [facility] Phone number:  [phone] Fax number:  [phone]   Type of Clearance Requested:   - Medical  - Pharmacy:  Hold Rivaroxaban (Xarelto) 3 days   Type of Anesthesia:  Not Indicated   Additional requests/questions:    Signed, Greer Ee   09/20/2022, 1:39 PM

## 2022-09-21 ENCOUNTER — Telehealth: Payer: Self-pay | Admitting: *Deleted

## 2022-09-21 ENCOUNTER — Encounter: Payer: Self-pay | Admitting: *Deleted

## 2022-09-21 ENCOUNTER — Encounter: Payer: Medicare Other | Admitting: *Deleted

## 2022-09-21 NOTE — Telephone Encounter (Signed)
Patient with diagnosis of afib on Xarelto for anticoagulation.    Procedure:    Colonoscopy/Endoscopy  Date of procedure: 09/27/22   CHA2DS2-VASc Score = 6   This indicates a 9.7% annual risk of stroke. The patient's score is based upon: CHF History: 1 HTN History: 1 Diabetes History: 1 Stroke History: 0 Vascular Disease History: 0 Age Score: 2 Gender Score: 1      Of note, patient does have a hx of remote PE.   CrCl 49 ml/min  Per office protocol, patient can hold Xarelto for 2 days prior to procedure.    **This guidance is not considered finalized until pre-operative APP has relayed final recommendations.**

## 2022-09-21 NOTE — Telephone Encounter (Signed)
Primary Cardiologist:Mihai Croitoru, MD   Preoperative team, please contact this patient and set up a phone call appointment for further preoperative risk assessment. Please obtain consent and complete medication review. Thank you for your help.   I confirm that guidance regarding antiplatelet and oral anticoagulation therapy has been completed and, if necessary, noted below.   Emmaline Life, NP-C  09/21/2022, 8:07 AM [address] Office [phone] Fax [phone]

## 2022-09-21 NOTE — Patient Outreach (Signed)
  Care Coordination   Mental health referral  Visit Note   09/21/2022 Name: [name] MRN: [id number] DOB: 03-13-1946  [name] is a 77 y.o. year old female who sees Alicia Sanders, MD for primary care. I  completed and securely emailed mental health referral to [facility]  What matters to the patients health and wellness today?  Mental Health Follow up    Goals Addressed             This Visit's Progress    Managing anxiety       Care Coordination Interventions:   referral completed to [facility]         SDOH assessments and interventions completed:  No     Care Coordination Interventions:  Yes, provided  Interventions Today    Flowsheet Row Most Recent Value  Chronic Disease   Chronic disease during today's visit Other  Mental Health Interventions   Mental Health Discussed/Reviewed Other  [referral completed for mental health counseling-referral emailed to [facility] as discussed with patient prior]       Follow up plan: Follow up call scheduled for 10/04/22    Encounter Outcome:  Pt. Visit Completed

## 2022-09-21 NOTE — Telephone Encounter (Signed)
Pt has been scheduled a tele pre op appt 09/23/21 per pre op APP today Christen Bame, NP. Tele appt 09/24/22 @ 10:40. Med rec and consent are done.     Patient Consent for Virtual Visit        [name] has provided verbal consent on 09/21/2022 for a virtual visit (video or telephone).   CONSENT FOR VIRTUAL VISIT FOR:  [name]  By participating in this virtual visit I agree to the following:  I hereby voluntarily request, consent and authorize [facility] and its employed or contracted physicians, physician assistants, nurse practitioners or other licensed health care professionals (the Practitioner), to provide me with telemedicine health care services (the "Services") as deemed necessary by the treating Practitioner. I acknowledge and consent to receive the Services by the Practitioner via telemedicine. I understand that the telemedicine visit will involve communicating with the Practitioner through live audiovisual communication technology and the disclosure of certain medical information by electronic transmission. I acknowledge that I have been given the opportunity to request an in-person assessment or other available alternative prior to the telemedicine visit and am voluntarily participating in the telemedicine visit.  I understand that I have the right to withhold or withdraw my consent to the use of telemedicine in the course of my care at any time, without affecting my right to future care or treatment, and that the Practitioner or I may terminate the telemedicine visit at any time. I understand that I have the right to inspect all information obtained and/or recorded in the course of the telemedicine visit and may receive copies of available information for a reasonable fee.  I understand that some of the potential risks of receiving the Services via telemedicine include:  Delay or interruption in medical evaluation due to technological equipment failure or  disruption; Information transmitted may not be sufficient (e.g. poor resolution of images) to allow for appropriate medical decision making by the Practitioner; and/or  In rare instances, security protocols could fail, causing a breach of personal health information.  Furthermore, I acknowledge that it is my responsibility to provide information about my medical history, conditions and care that is complete and accurate to the best of my ability. I acknowledge that Practitioner's advice, recommendations, and/or decision may be based on factors not within their control, such as incomplete or inaccurate data provided by me or distortions of diagnostic images or specimens that may result from electronic transmissions. I understand that the practice of medicine is not an exact science and that Practitioner makes no warranties or guarantees regarding treatment outcomes. I acknowledge that a copy of this consent can be made available to me via my patient portal ([place]), or I can request a printed copy by calling the office of [facility].    I understand that my insurance will be billed for this visit.   I have read or had this consent read to me. I understand the contents of this consent, which adequately explains the benefits and risks of the Services being provided via telemedicine.  I have been provided ample opportunity to ask questions regarding this consent and the Services and have had my questions answered to my satisfaction. I give my informed consent for the services to be provided through the use of telemedicine in my medical care

## 2022-09-21 NOTE — Telephone Encounter (Signed)
Pt has been scheduled a tele pre op appt 09/23/21 per pre op APP today Christen Bame, NP. Tele appt 09/24/22 @ 10:40. Med rec and consent are done.

## 2022-09-24 ENCOUNTER — Ambulatory Visit: Payer: Medicare Other | Attending: Cardiovascular Disease

## 2022-09-24 DIAGNOSIS — Z01810 Encounter for preprocedural cardiovascular examination: Secondary | ICD-10-CM | POA: Diagnosis not present

## 2022-09-24 NOTE — Progress Notes (Signed)
Virtual Visit via Telephone Note   Because of [name]'s co-morbid illnesses, she is at least at moderate risk for complications without adequate follow up.  This format is felt to be most appropriate for this patient at this time.  The patient did not have access to video technology/had technical difficulties with video requiring transitioning to audio format only (telephone).  All issues noted in this document were discussed and addressed.  No physical exam could be performed with this format.  Please refer to the patient's chart for her consent to telehealth for [facility].  Evaluation Performed:  Preoperative cardiovascular risk assessment _____________   Date:  09/24/2022   Patient ID:  [name], DOB July 03, 1946, MRN [id number] Patient Location:  Home Provider location:   Office  Primary Care Provider:  [name], MD Primary Cardiologist:  Sanda Klein, MD  Chief Complaint / Patient Profile   77 y.o. y/o female with a h/o PAF, HTN, PE, pulmonary hypertension, DM type II, HLD, OSA, breast CAD s/p lumpectomy, moderate obesity who is pending colonoscopy and endoscopy and presents today for telephonic preoperative cardiovascular risk assessment.  History of Present Illness    [name] is a 77 y.o. female who presents via audio/video conferencing for a telehealth visit today.  Pt was last seen in cardiology clinic on 04/25/2022 by Dr. Sallyanne Kuster.  At that time [facility] was doing well with no palpitations, complaints of fatigue from bradycardia, weakness, or dizziness.  She had good blood pressure control.The patient is now pending procedure as outlined above. Since her last visit, she that she is doing well and feeling better since her previous visit.  She is working on being more active since her knee replacement surgery.  She is participating in chair yoga exercises and tolerating them well.   She denies chest pain, shortness of breath, lower  extremity edema, fatigue, palpitations, melena, hematuria, hemoptysis, diaphoresis, weakness, presyncope, syncope, orthopnea, and PND.     Past Medical History    Past Medical History:  Diagnosis Date   Allergic rhinitis    Allergy    Arthritis    Asthma    Breast cancer ([place])    Chronic headache    Diabetes mellitus    Ductal carcinoma in situ (DCIS) of left breast 02/23/2022   DVT (deep venous thrombosis) ([facility])    Dyspnea    Heart murmur    History of radiation therapy    Left Breast 05/02/22-05/30/22- Dr. Gery Pray   Hypertension    Penicillin allergy 01/19/2020   Pulmonary embolism ([place])    Pulmonary hypertension ([place])    Seizures ([place])    Past Surgical History:  Procedure Laterality Date   ABDOMINAL HYSTERECTOMY     partial, has ovaries   BREAST LUMPECTOMY WITH RADIOACTIVE SEED LOCALIZATION Left 03/29/2022   Procedure: LEFT BREAST LUMPECTOMY WITH RADIOACTIVE SEED LOCALIZATION;  Surgeon: Erroll Luna, MD;  Location: [place];  Service: General;  Laterality: Left;   CARDIAC CATHETERIZATION  12/28/2010   Mod. pulmonary hypertension, normal coronary arteries   CARDIOVERSION N/A 11/23/2020   Procedure: CARDIOVERSION;  Surgeon: Sanda Klein, MD;  Location: [place];  Service: Cardiovascular;  Laterality: N/A;   CHOLECYSTECTOMY N/A 12/20/2021   Procedure: LAPAROSCOPIC CHOLECYSTECTOMY WITH INTRAOPERATIVE CHOLANGIOGRAM;  Surgeon: Armandina Gemma, MD;  Location: [facility];  Service: General;  Laterality: N/A;   DOPPLER ECHOCARDIOGRAPHY  10/08/2011   EF=>55%,mild asymmetric LVH, mod. TR, mod. PH, mild to mod LA dilatation  IR LUMBAR DISC ASPIRATION W/IMG GUIDE  01/12/2020   KNEE ARTHROSCOPY Left    KNEE SURGERY     Nuclear Stress Test  05/20/2006   No ischemia   PARTIAL HYSTERECTOMY     PLANTAR FASCIA SURGERY     TONSILLECTOMY      Allergies  Allergies  Allergen Reactions   Dilantin [Phenytoin] Swelling    facial swelling   Latex Hives   Oxycodone Nausea And Vomiting  and Nausea Only    Abdominal Pain, Vomiting   Penicillins Nausea And Vomiting and Swelling    Has patient had a PCN reaction causing immediate rash, facial/tongue/throat swelling, SOB or lightheadedness with hypotension patient had a PCN reaction causing severe rash involving mucus membranes or skin necrosis: [id number] Has patient had a PCN reaction that required hospitalization/No Has patient had a PCN reaction occurring within the last 10 years: No If all of the above answers are "NO", then may proceed with Cephalosporin use.      Codeine Nausea Only    Tolerates tylenol with codeine   Hydrocodone Nausea And Vomiting   Nickel Hives and Rash   Pregabalin Itching and Other (See Comments)    headaches/problem w/vision    Home Medications    Prior to Admission medications   Medication Sig Start Date End Date Taking? Authorizing Provider  acetaminophen (TYLENOL) 500 MG tablet Take 500-1,000 mg by mouth every 6 (six) hours as needed for moderate pain.    [provider]  acetaminophen-codeine (TYLENOL #3) 300-30 MG tablet Take 1-2 tablets by mouth every 6 (six) hours as needed for moderate pain. 05/22/22   Bedsole, Amy E, MD  albuterol (VENTOLIN HFA) 108 (90 Base) MCG/ACT inhaler INHALE 2 PUFFS BY MOUTH EVERY 6 HOURS AS NEEDED FOR WHEEZING OR SHORTNESS OF BREATH 06/19/22   Kozlow, Donnamarie Poag, MD  anastrozole (ARIMIDEX) 1 MG tablet TAKE 1 TABLET(1 MG) BY MOUTH DAILY 07/11/22   Nicholas Lose, MD  Artificial Saliva (BIOTENE DRY MOUTH) LOZG Use as directed 1 lozenge in the mouth or throat daily as needed (dry mouth).    [provider]  atorvastatin (LIPITOR) 10 MG tablet TAKE 1 TABLET(10 MG) BY MOUTH DAILY 06/03/22   Bedsole, Amy E, MD  Blood Glucose Monitoring Suppl (ONETOUCH VERIO) w/Device KIT Use to check blood sugar up to 2 times a day 08/16/20   Bedsole, Amy E, MD  CALCIUM PO Take 1,000 mg by mouth daily.    [provider]  Coenzyme Q10 (COQ10) 100 MG CAPS Take  100 mg by mouth daily.    [provider]  cyclobenzaprine (FLEXERIL) 10 MG tablet Take 0.5-1 tablets (5-10 mg total) by mouth at bedtime as needed for muscle spasms. 03/09/22   Bedsole, Amy E, MD  desloratadine (CLARINEX) 5 MG tablet Take 1 tablet (5 mg total) by mouth daily. 07/31/22   Kozlow, Donnamarie Poag, MD  EPINEPHrine 0.3 mg/0.3 mL IJ SOAJ injection USE AS DIRECTED FOR LIFE THREATENING ALLERGIC REACTIONS 12/12/21   Kozlow, Donnamarie Poag, MD  estradiol (ESTRACE) 0.1 MG/GM vaginal cream Place 1 Applicatorful vaginally 2 (two) times a week. (As Needed)    [provider]  famotidine (PEPCID) 40 MG tablet Take 1 (ONE) tablet by mouth every evening. 06/19/22   Kozlow, Donnamarie Poag, MD  fluticasone (FLOVENT HFA) 220 MCG/ACT inhaler Inhale 2 puffs into the lungs 2 (two) times daily as needed (shortness of breath).    [provider]  fluticasone-salmeterol (ADVAIR HFA) 230-21 MCG/ACT inhaler 2 inhalations 1-2  times a day depending on disease activity 06/19/22   Kozlow, Donnamarie Poag, MD  gabapentin (NEURONTIN) 300 MG capsule Take one capsule in the morning, one at lunch and two at bedtime 09/05/22   Hyatt, Max T, DPM  glucose blood (ONETOUCH VERIO) test strip 1 strip 2 (two) times daily 01/29/22   [provider]  GLYXAMBI 10-5 MG TABS TAKE 1 TABLET BY MOUTH DAILY 09/06/22   Bedsole, Amy E, MD  hydrOXYzine (ATARAX/VISTARIL) 10 MG tablet Take 1 tablet (10 mg total) by mouth daily as needed for itching. 04/18/21   Bedsole, Amy E, MD  ipratropium (ATROVENT) 0.06 % nasal spray 1-2 sprays in each nostril 1-2 times per day as needed to dry nose 12/26/21   Kozlow, Donnamarie Poag, MD  irbesartan (AVAPRO) 150 MG tablet TAKE 1 TABLET(150 MG) BY MOUTH DAILY 07/02/22   Croitoru, Mihai, MD  Lactase (DAIRY-RELIEF PO) Take 1 capsule by mouth daily as needed (eating dairy).     [provider]  Lancets (ONETOUCH DELICA PLUS [id number]) MISC USE TO CHECK BLOOD SUGAR UP TO TWICE DAILY 01/29/22   Bedsole, Amy E, MD   levalbuterol (XOPENEX) 1.25 MG/3ML nebulizer solution USE 1 VIAL VIA NEBULIZER EVERY 6 HOURS AS NEEDED FOR SHORTNESS OF BREATH OR WHEEZING 09/10/22   Kozlow, Donnamarie Poag, MD  liver oil-zinc oxide (DESITIN) 40 % ointment Apply 1 application topically as needed for irritation.    [provider]  Magnesium 500 MG TABS Take 500 mg by mouth daily.    [provider]  Menthol, Topical Analgesic, (ABSORBINE PLUS JR EX) Apply 1 patch topically daily as needed (pain).    [provider]  Menthol-Methyl Salicylate (SALONPAS JET SPRAY EX) Apply 1 spray topically daily as needed (knee pain).    [provider]  METAMUCIL FIBER PO Take 1 Dose by mouth daily. 1 dose = 1/2 tablespoon    [provider]  methimazole (TAPAZOLE) 5 MG tablet Take 1 tablet (5 mg total) by mouth every other day. 05/02/22   Philemon Kingdom, MD  montelukast (SINGULAIR) 10 MG tablet Take 1 tablet (10 mg total) by mouth at bedtime. For asthma control. 06/19/22   Kozlow, Donnamarie Poag, MD  Mouthwashes (BIOTENE DRY MOUTH MT) Use as directed 1 Dose in the mouth or throat daily as needed (dry mouth).    [provider]  nystatin (MYCOSTATIN) 100000 UNIT/ML suspension 28m swish and swallow after Advair 2 times daily. 12/26/21   Kozlow, EDonnamarie Poag MD  ondansetron (ZOFRAN-ODT) 8 MG disintegrating tablet Take 1 tablet (8 mg total) by mouth every 8 (eight) hours as needed for nausea or vomiting. 10/16/21   ALacretia Leigh MD  ONETOUCH VERIO test strip USE TO CHECK BLOOD SUGAR UP TO TWICE DAILY AS DIRECTED 01/29/22   Bedsole, Amy E, MD  oxybutynin (DITROPAN-XL) 10 MG 24 hr tablet Take 10 mg by mouth at bedtime. 12/10/21   [provider]  pantoprazole (PROTONIX) 40 MG tablet Take 1 (ONE) tablet by mouth every morning. 06/19/22   Kozlow, EDonnamarie Poag MD  Plecanatide (TRULANCE) 3 MG TABS Take 3 mg by mouth daily as needed (constipation).    [provider]  Polyethyl Glycol-Propyl Glycol (SYSTANE OP) Place  1 drop into both eyes daily as needed (dry eyes).    [provider]  polyethylene glycol powder (GLYCOLAX/MIRALAX) 17 GM/SCOOP powder Take 1 Container by mouth once.    [provider]  PRESCRIPTION MEDICATION Inhale into the lungs at bedtime. CPAP  [provider]  Tiotropium Bromide Monohydrate (SPIRIVA RESPIMAT) 1.25 MCG/ACT AERS Inhale 2 each into the lungs daily.    [provider]  Tiotropium Bromide Monohydrate (SPIRIVA RESPIMAT) 2.5 MCG/ACT AERS 1 inhalations 1 time per day. 06/19/22   Kozlow, Donnamarie Poag, MD  tolterodine (DETROL LA) 4 MG 24 hr capsule Take 4 mg by mouth at bedtime.    [provider]  traMADol (ULTRAM) 50 MG tablet Take 1 tablet (50 mg total) by mouth every 6 (six) hours as needed. 03/29/22 03/29/23  Cornett, Marcello Moores, MD  XARELTO 20 MG TABS tablet TAKE 1 TABLET(20 MG) BY MOUTH DAILY WITH SUPPER 07/26/22   Croitoru, Mihai, MD  Zinc 25 MG TABS Take 25 mg by mouth daily.    [provider]    Physical Exam    Vital Signs:  JESSICKA DEBORDE does not have vital signs available for review today.  Given telephonic nature of communication, physical exam is limited. AAOx3. NAD. Normal affect.  Speech and respirations are unlabored.  Accessory Clinical Findings    None  Assessment & Plan    1.  Preoperative Cardiovascular Risk Assessment:   Ms. Fong's perioperative risk of a major cardiac event is 0.4% according to the Revised Cardiac Risk Index (RCRI).  Therefore, she is at low risk for perioperative complications.   Her functional capacity is fair at 4.64 METs according to the [place] Activity Status Index (DASI). Recommendations: According to ACC/AHA guidelines, no further cardiovascular testing needed.  The patient may proceed to surgery at acceptable risk.   Antiplatelet and/or Anticoagulation Recommendations:  Xarelto (Rivaroxaban) can be held for 2 days prior to surgery.  Please resume post op when felt to be safe.       The patient was advised that if she develops new symptoms prior to surgery to contact our office to arrange for a follow-up visit, and she verbalized understanding.   A copy of this note will be routed to requesting surgeon.  Time:   Today, I have spent 6 minutes with the patient with telehealth technology discussing medical history, symptoms, and management plan.     Mable Fill, Marissa Nestle, NP  09/24/2022, 7:43 AM

## 2022-09-25 ENCOUNTER — Telehealth: Payer: Self-pay | Admitting: *Deleted

## 2022-09-25 ENCOUNTER — Ambulatory Visit: Payer: Self-pay

## 2022-09-25 MED ORDER — ADVAIR HFA 230-21 MCG/ACT IN AERO: INHALATION_SPRAY | RESPIRATORY_TRACT | 5 refills | Status: DC

## 2022-09-25 NOTE — Telephone Encounter (Signed)
Medication refill

## 2022-09-25 NOTE — Patient Outreach (Signed)
  Care Coordination   Follow Up Visit Note   09/25/2022 Name: [name] MRN: [id number] DOB: 1945/11/27  [name] is a 77 y.o. year old female who sees Alicia Sanders, MD for primary care. I spoke with  [name] by phone today.  What matters to the patients health and wellness today?  Patient states she is happy about social worker referring her to a therapist.  Patient reports having an increase in anxiety after waking up from having a dream on night.    Goals Addressed             This Visit's Progress    management of feelings of anxiety       Interventions Today    Flowsheet Row Most Recent Value  Chronic Disease   Chronic disease during today's visit Other  [anxiety. Evaluation of current treatment plan related to anxiety and patient's adherence to plan as established by provider.]  General Interventions   General Interventions Discussed/Reviewed General Interventions Reviewed  [Discussed follow up with social worker and referral to therapist.]  Exercise Interventions   Exercise Discussed/Reviewed Exercise Reviewed  [Encouraged to continue exercises at least 2-3 times per week.]  Education Interventions   Education Provided Provided Education  Provided Verbal Education On Other  [provided education on anxiety management strategies such as deep breathing, exercising, listening to relaxing music, applying 333 rule of anxiety, anxiety lowering foods, drinking hot tea: chamomile, lavender, orange.]  Pharmacy Interventions   Pharmacy Dicussed/Reviewed Pharmacy Topics Reviewed  [medications reviewed and compliance discussed.]              SDOH assessments and interventions completed:  No     Care Coordination Interventions:  Yes, provided   Follow up plan: Follow up call scheduled for 11/22/22    Encounter Outcome:  Pt. Visit Completed   Alicia Plowman RN,BSN,CCM [place] [phone] direct line

## 2022-09-26 ENCOUNTER — Encounter: Payer: Self-pay | Admitting: Adult Health

## 2022-09-26 ENCOUNTER — Inpatient Hospital Stay: Payer: Medicare Other | Attending: Hematology and Oncology | Admitting: Adult Health

## 2022-09-26 ENCOUNTER — Other Ambulatory Visit: Payer: Self-pay

## 2022-09-26 VITALS — BP 120/80 | HR 77 | Temp 98.1°F | Resp 18 | Ht 64.25 in | Wt 192.9 lb

## 2022-09-26 DIAGNOSIS — Z90710 Acquired absence of both cervix and uterus: Secondary | ICD-10-CM | POA: Insufficient documentation

## 2022-09-26 DIAGNOSIS — Z803 Family history of malignant neoplasm of breast: Secondary | ICD-10-CM | POA: Diagnosis not present

## 2022-09-26 DIAGNOSIS — Z79811 Long term (current) use of aromatase inhibitors: Secondary | ICD-10-CM | POA: Insufficient documentation

## 2022-09-26 DIAGNOSIS — D0512 Intraductal carcinoma in situ of left breast: Secondary | ICD-10-CM | POA: Diagnosis not present

## 2022-09-26 DIAGNOSIS — Z800 Family history of malignant neoplasm of digestive organs: Secondary | ICD-10-CM | POA: Insufficient documentation

## 2022-09-26 NOTE — Progress Notes (Signed)
SURVIVORSHIP VISIT:  BRIEF ONCOLOGIC HISTORY:  Oncology History  Ductal carcinoma in situ (DCIS) of left breast  02/19/2022 Initial Diagnosis   Screening mammogram detected left breast asymmetry 1.1 cm oval mass on biopsy revealed intermediate grade DCIS ER 80% weak, PR negative    Genetic Testing   [facility] CustomNext Panel was Negative. Report date is 03/13/2022.  The CustomNext-Cancer+RNAinsight panel offered by [facility] includes sequencing and rearrangement analysis for the following 47 genes:  APC, ATM, AXIN2, BARD1, BMPR1A, BRCA1, BRCA2, BRIP1, CDH1, CDK4, CDKN2A, CHEK2, CTNNA1, DICER1, EPCAM, GREM1, HOXB13, KIT, MEN1, MLH1, MSH2, MSH3, MSH6, MUTYH, NBN, NF1, NTHL1, PALB2, PDGFRA, PMS2, POLD1, POLE, PTEN, RAD50, RAD51C, RAD51D, SDHA, SDHB, SDHC, SDHD, SMAD4, SMARCA4, STK11, TP53, TSC1, TSC2, and VHL.  RNA data is routinely analyzed for use in variant interpretation for all genes.   03/29/2022 Surgery   Left lumpectomy: Intermediate grade DCIS, margins negative, 1 cm size, ER 80% weak, PR 0%   05/02/2022 - 05/30/2022 Radiation Therapy   Site Technique Total Dose (Gy) Dose per Fx (Gy) Completed Fx Beam Energies  Breast, Left: Breast_L 3D 40.05/40.05 2.67 15/15 6XFFF  Breast, Left: Breast_L_Bst 3D 12/12 2 6/6 6X     08/2022 -  Anti-estrogen oral therapy   Anastrozole     INTERVAL HISTORY:  [name] to review her survivorship care plan detailing her treatment course for breast cancer, as well as monitoring long-term side effects of that treatment, education regarding health maintenance, screening, and overall wellness and health promotion.     Overall, [name] reports feeling quite well.  She is taking the Anastrozole daily and tolerates it well.  She denies any hot flashes, vaginal dryness, or arthralgias.   REVIEW OF SYSTEMS:  Review of Systems  Constitutional:  Negative for appetite change, chills, fatigue, fever and unexpected weight change.  HENT:   Negative for hearing  loss, lump/mass and trouble swallowing.   Eyes:  Negative for eye problems and icterus.  Respiratory:  Negative for chest tightness, cough and shortness of breath.   Cardiovascular:  Negative for chest pain, leg swelling and palpitations.  Gastrointestinal:  Negative for abdominal distention, abdominal pain, constipation, diarrhea, nausea and vomiting.  Endocrine: Negative for hot flashes.  Genitourinary:  Negative for difficulty urinating.   Musculoskeletal:  Negative for arthralgias.  Skin:  Negative for itching and rash.  Neurological:  Negative for dizziness, extremity weakness, headaches and numbness.  Hematological:  Negative for adenopathy. Does not bruise/bleed easily.  Psychiatric/Behavioral:  Negative for depression. The patient is not nervous/anxious.   Breast: Denies any new nodularity, masses, tenderness, nipple changes, or nipple discharge.     PAST MEDICAL/SURGICAL HISTORY:  Past Medical History:  Diagnosis Date   Allergic rhinitis    Allergy    Arthritis    Asthma    Breast cancer ([place])    Chronic headache    Diabetes mellitus    Ductal carcinoma in situ (DCIS) of left breast 02/23/2022   DVT (deep venous thrombosis) ([facility])    Dyspnea    Heart murmur    History of radiation therapy    Left Breast 05/02/22-05/30/22- Dr. Gery Pray   Hypertension    Penicillin allergy 01/19/2020   Pulmonary embolism ([place])    Pulmonary hypertension ([place])    Seizures ([place])    Past Surgical History:  Procedure Laterality Date   ABDOMINAL HYSTERECTOMY     partial, has ovaries   BREAST LUMPECTOMY WITH RADIOACTIVE SEED LOCALIZATION Left 03/29/2022   Procedure: LEFT BREAST  LUMPECTOMY WITH RADIOACTIVE SEED LOCALIZATION;  Surgeon: Erroll Luna, MD;  Location: [facility];  Service: General;  Laterality: Left;   CARDIAC CATHETERIZATION  12/28/2010   Mod. pulmonary hypertension, normal coronary arteries   CARDIOVERSION N/A 11/23/2020   Procedure: CARDIOVERSION;  Surgeon: Sanda Klein,  MD;  Location: [facility];  Service: Cardiovascular;  Laterality: N/A;   CHOLECYSTECTOMY N/A 12/20/2021   Procedure: LAPAROSCOPIC CHOLECYSTECTOMY WITH INTRAOPERATIVE CHOLANGIOGRAM;  Surgeon: Armandina Gemma, MD;  Location: [facility];  Service: General;  Laterality: N/A;   DOPPLER ECHOCARDIOGRAPHY  10/08/2011   EF=>55%,mild asymmetric LVH, mod. TR, mod. PH, mild to mod LA dilatation   IR LUMBAR DISC ASPIRATION W/IMG GUIDE  01/12/2020   KNEE ARTHROSCOPY Left    KNEE SURGERY     Nuclear Stress Test  05/20/2006   No ischemia   PARTIAL HYSTERECTOMY     PLANTAR FASCIA SURGERY     TONSILLECTOMY       ALLERGIES:  Allergies  Allergen Reactions   Dilantin [Phenytoin] Swelling    facial swelling   Latex Hives   Oxycodone Nausea And Vomiting and Nausea Only    Abdominal Pain, Vomiting   Penicillins Nausea And Vomiting and Swelling    Has patient had a PCN reaction causing immediate rash, facial/tongue/throat swelling, SOB or lightheadedness with hypotension patient had a PCN reaction causing severe rash involving mucus membranes or skin necrosis: [id number] Has patient had a PCN reaction that required hospitalization/No Has patient had a PCN reaction occurring within the last 10 years: No If all of the above answers are "NO", then may proceed with Cephalosporin use.      Codeine Nausea Only    Tolerates tylenol with codeine   Hydrocodone Nausea And Vomiting   Nickel Hives and Rash   Pregabalin Itching and Other (See Comments)    headaches/problem w/vision     CURRENT MEDICATIONS:  Outpatient Encounter Medications as of 09/26/2022  Medication Sig Note   acetaminophen (TYLENOL) 500 MG tablet Take 500-1,000 mg by mouth every 6 (six) hours as needed for moderate pain.    acetaminophen-codeine (TYLENOL #3) 300-30 MG tablet Take 1-2 tablets by mouth every 6 (six) hours as needed for moderate pain.    ADVAIR HFA 230-21 MCG/ACT inhaler 2 puffs 1-2 times daily depending on disease activity.     albuterol (VENTOLIN HFA) 108 (90 Base) MCG/ACT inhaler INHALE 2 PUFFS BY MOUTH EVERY 6 HOURS AS NEEDED FOR WHEEZING OR SHORTNESS OF BREATH    anastrozole (ARIMIDEX) 1 MG tablet TAKE 1 TABLET(1 MG) BY MOUTH DAILY    Artificial Saliva (BIOTENE DRY MOUTH) LOZG Use as directed 1 lozenge in the mouth or throat daily as needed (dry mouth).    atorvastatin (LIPITOR) 10 MG tablet TAKE 1 TABLET(10 MG) BY MOUTH DAILY    Blood Glucose Monitoring Suppl (ONETOUCH VERIO) w/Device KIT Use to check blood sugar up to 2 times a day    CALCIUM PO Take 1,000 mg by mouth daily.    Coenzyme Q10 (COQ10) 100 MG CAPS Take 100 mg by mouth daily.    cyclobenzaprine (FLEXERIL) 10 MG tablet Take 0.5-1 tablets (5-10 mg total) by mouth at bedtime as needed for muscle spasms.    desloratadine (CLARINEX) 5 MG tablet Take 1 tablet (5 mg total) by mouth daily.    EPINEPHrine 0.3 mg/0.3 mL IJ SOAJ injection USE AS DIRECTED FOR LIFE THREATENING ALLERGIC REACTIONS    estradiol (ESTRACE) 0.1 MG/GM vaginal cream Place 1 Applicatorful vaginally 2 (two) times a week. (As  Needed)    famotidine (PEPCID) 40 MG tablet Take 1 (ONE) tablet by mouth every evening.    fluticasone (FLOVENT HFA) 220 MCG/ACT inhaler Inhale 2 puffs into the lungs 2 (two) times daily as needed (shortness of breath).    fluticasone-salmeterol (ADVAIR HFA) 230-21 MCG/ACT inhaler 2 inhalations 1-2 times a day depending on disease activity    gabapentin (NEURONTIN) 300 MG capsule Take one capsule in the morning, one at lunch and two at bedtime    glucose blood (ONETOUCH VERIO) test strip 1 strip 2 (two) times daily    GLYXAMBI 10-5 MG TABS TAKE 1 TABLET BY MOUTH DAILY    hydrOXYzine (ATARAX/VISTARIL) 10 MG tablet Take 1 tablet (10 mg total) by mouth daily as needed for itching.    ipratropium (ATROVENT) 0.06 % nasal spray 1-2 sprays in each nostril 1-2 times per day as needed to dry nose    irbesartan (AVAPRO) 150 MG tablet TAKE 1 TABLET(150 MG) BY MOUTH DAILY    Lactase  (DAIRY-RELIEF PO) Take 1 capsule by mouth daily as needed (eating dairy).     Lancets (ONETOUCH DELICA PLUS 123XX123) MISC USE TO CHECK BLOOD SUGAR UP TO TWICE DAILY    levalbuterol (XOPENEX) 1.25 MG/3ML nebulizer solution USE 1 VIAL VIA NEBULIZER EVERY 6 HOURS AS NEEDED FOR SHORTNESS OF BREATH OR WHEEZING    liver oil-zinc oxide (DESITIN) 40 % ointment Apply 1 application topically as needed for irritation.    Magnesium 500 MG TABS Take 500 mg by mouth daily.    Menthol, Topical Analgesic, (ABSORBINE PLUS JR EX) Apply 1 patch topically daily as needed (pain).    Menthol-Methyl Salicylate (SALONPAS JET SPRAY EX) Apply 1 spray topically daily as needed (knee pain).    METAMUCIL FIBER PO Take 1 Dose by mouth daily. 1 dose = 1/2 tablespoon    methimazole (TAPAZOLE) 5 MG tablet Take 1 tablet (5 mg total) by mouth every other day.    montelukast (SINGULAIR) 10 MG tablet Take 1 tablet (10 mg total) by mouth at bedtime. For asthma control.    Mouthwashes (BIOTENE DRY MOUTH MT) Use as directed 1 Dose in the mouth or throat daily as needed (dry mouth).    nystatin (MYCOSTATIN) 100000 UNIT/ML suspension 17m swish and swallow after Advair 2 times daily.    ondansetron (ZOFRAN-ODT) 8 MG disintegrating tablet Take 1 tablet (8 mg total) by mouth every 8 (eight) hours as needed for nausea or vomiting.    ONETOUCH VERIO test strip USE TO CHECK BLOOD SUGAR UP TO TWICE DAILY AS DIRECTED    oxybutynin (DITROPAN-XL) 10 MG 24 hr tablet Take 10 mg by mouth at bedtime. 03/20/2022: Takes oxybutynin and tolterodine both prescribed by Dr. MMatilde Sprang    pantoprazole (PROTONIX) 40 MG tablet Take 1 (ONE) tablet by mouth every morning.    Plecanatide (TRULANCE) 3 MG TABS Take 3 mg by mouth daily as needed (constipation).    Polyethyl Glycol-Propyl Glycol (SYSTANE OP) Place 1 drop into both eyes daily as needed (dry eyes).    polyethylene glycol powder (GLYCOLAX/MIRALAX) 17 GM/SCOOP powder Take 1 Container by mouth once.     PRESCRIPTION MEDICATION Inhale into the lungs at bedtime. CPAP    Tiotropium Bromide Monohydrate (SPIRIVA RESPIMAT) 1.25 MCG/ACT AERS Inhale 2 each into the lungs daily.    Tiotropium Bromide Monohydrate (SPIRIVA RESPIMAT) 2.5 MCG/ACT AERS 1 inhalations 1 time per day.    tolterodine (DETROL LA) 4 MG 24 hr capsule Take 4 mg by mouth at bedtime. 03/20/2022:  Takes oxybutynin and tolterodine both prescribed by Dr. Matilde Sprang.    traMADol (ULTRAM) 50 MG tablet Take 1 tablet (50 mg total) by mouth every 6 (six) hours as needed.    XARELTO 20 MG TABS tablet TAKE 1 TABLET(20 MG) BY MOUTH DAILY WITH SUPPER    Zinc 25 MG TABS Take 25 mg by mouth daily.    Facility-Administered Encounter Medications as of 09/26/2022  Medication   omalizumab Arvid Right) injection 300 mg     ONCOLOGIC FAMILY HISTORY:  Family History  Problem Relation Age of Onset   Cancer Mother        breast and colon cancer   Hypertension Mother    Clotting disorder Mother    Breast cancer Mother 27   Arthritis Mother    Stroke Mother    Diabetes Mother    Colon cancer Mother 67 - 17   Alcohol abuse Father    Cancer Sister        breast cancer   Multiple sclerosis Sister    Cancer Sister        colon cancer   Cancer Brother        colon cancer   Prostate cancer Half-Brother    Stomach cancer Half-Brother    Breast cancer Half-Sister 65       recurrence at 31, reports positive genetic testing   Arthritis Half-Sister    Diabetes Half-Sister    Colon cancer Half-Sister 1   Allergies Other        grandson   Allergic rhinitis Neg Hx    Angioedema Neg Hx    Asthma Neg Hx    Atopy Neg Hx    Eczema Neg Hx    Immunodeficiency Neg Hx    Urticaria Neg Hx    SOCIAL HISTORY:  Social History   Socioeconomic History   Marital status: Widowed    Spouse name: Not on file   Number of children: Y   Years of education: Not on file   Highest education level: Not on file  Occupational History   Occupation: retired Insurance account manager.   Tobacco Use   Smoking status: Never   Smokeless tobacco: Never  Vaping Use   Vaping Use: Never used  Substance and Sexual Activity   Alcohol use: Not Currently    Alcohol/week: 0.0 standard drinks of alcohol    Comment: occ glass on wine   Drug use: No   Sexual activity: Never  Other Topics Concern   Not on file  Social History Narrative   Widow    limited exercise.   Social Determinants of Health   Financial Resource Strain: Low Risk  (09/12/2022)   Overall Financial Resource Strain (CARDIA)    Difficulty of Paying Living Expenses: Not hard at all  Food Insecurity: No Food Insecurity (09/12/2022)   Hunger Vital Sign    Worried About Running Out of Food in the Last Year: Never true    Ran Out of Food in the Last Year: Never true  Transportation Needs: No Transportation Needs (09/12/2022)   PRAPARE - Hydrologist (Medical): No    Lack of Transportation (Non-Medical): No  Physical Activity: Not on file  Stress: Not on file  Social Connections: Not on file  Intimate Partner Violence: Not on file     OBSERVATIONS/OBJECTIVE:  BP 120/80 (BP Location: Left Arm, Patient Position: Sitting)   Pulse 77   Temp 98.1 F (36.7 C) (Tympanic)   Resp 18  Ht 5' 4.25" (1.632 m)   Wt 192 lb 14.4 oz (87.5 kg)   SpO2 100%   BMI 32.85 kg/m  GENERAL: Patient is a well appearing female in no acute distress HEENT:  Sclerae anicteric.  Oropharynx clear and moist. No ulcerations or evidence of oropharyngeal candidiasis. Neck is supple.  NODES:  No cervical, supraclavicular, or axillary lymphadenopathy palpated.  BREAST EXAM: Left breast status postlumpectomy and radiation no sign of local recurrence right breast is benign. LUNGS:  Clear to auscultation bilaterally.  No wheezes or rhonchi. HEART:  Regular rate and rhythm. No murmur appreciated. ABDOMEN:  Soft, nontender.  Positive, normoactive bowel sounds. No organomegaly palpated. MSK:  No focal  spinal tenderness to palpation. Full range of motion bilaterally in the upper extremities. EXTREMITIES:  No peripheral edema.   SKIN:  Clear with no obvious rashes or skin changes. No nail dyscrasia. NEURO:  Nonfocal. Well oriented.  Appropriate affect.  LABORATORY DATA:  None for this visit.  DIAGNOSTIC IMAGING:  None for this visit.      ASSESSMENT AND PLAN:  [name] is a pleasant 77 y.o. female with Stage 0 left breast DCIS, ER+/PR+, diagnosed in 02/2022, treated with lumpectomy, adjuvant radiation therapy, and anti-estrogen therapy with Anastrozole beginning in 08/2022.  She presents to the Survivorship Clinic for our initial meeting and routine follow-up post-completion of treatment for breast cancer.    1. Stage 0 left breast cancer:  [name] is continuing to recover from definitive treatment for breast cancer. She will follow-up with her medical oncologist, Dr. Lindi Adie in 07/2023 with history and physical exam per surveillance protocol.  She will continue her anti-estrogen therapy with Anastrozole. Thus far, she is tolerating the Anastrozole well, with minimal side effects. She was instructed to make Dr. Lindi Adie or myself aware if she begins to experience any worsening side effects of the medication and I could see her back in clinic to help manage those side effects, as needed. Her mammogram is due 01/2023; orders placed today. Today, a comprehensive survivorship care plan and treatment summary was reviewed with the patient today detailing her breast cancer diagnosis, treatment course, potential late/long-term effects of treatment, appropriate follow-up care with recommendations for the future, and patient education resources.  A copy of this summary, along with a letter will be sent to the patient's primary care provider via mail/fax/In Basket message after today's visit.    2. Bone health:  Given [name]'s age/history of breast cancer and her current treatment regimen including  anti-estrogen therapy with Anastrozole, she is at risk for bone demineralization.  Her last DEXA scan was 06/28/2022, which was normal.  I recommended that she repeat testing in December 2025.  She was given education on specific activities to promote bone health.  3. Cancer screening:  Due to [name]'s history and her age, she should receive screening for skin cancers, colon cancer.  The information and recommendations are listed on the patient's comprehensive care plan/treatment summary and were reviewed in detail with the patient.    4. Health maintenance and wellness promotion: [name] was encouraged to consume 5-7 servings of fruits and vegetables per day. We reviewed the "Nutrition Rainbow" handout. She was also encouraged to engage in moderate to vigorous exercise for 30 minutes per day most days of the week.  She was instructed to limit her alcohol consumption and continue to abstain from tobacco use.     5. Support services/counseling: It is not uncommon for this period of the  patient's cancer care trajectory to be one of many emotions and stressors.  She was given information regarding our available services and encouraged to contact me with any questions or for help enrolling in any of our support group/programs.    Follow up instructions:    -Return to cancer center 07/2023  -Mammogram due in July 2024 -Bone density testing in December 2025 -Follow up with surgery July 2024 -She is welcome to return back to the Survivorship Clinic at any time; no additional follow-up needed at this time.  -Consider referral back to survivorship as a long-term survivor for continued surveillance  The patient was provided an opportunity to ask questions and all were answered. The patient agreed with the plan and demonstrated an understanding of the instructions.   Total encounter time:45 minutes*in face-to-face visit time, chart review, lab review, care coordination, order entry, and documentation  of the encounter time.    Wilber Bihari, NP 09/26/22 11:05 AM Medical Oncology and Hematology [facility] [place],  [address] Tel. [phone]    Fax. [phone]  *Total Encounter Time as defined by the Centers for Medicare and Medicaid Services includes, in addition to the face-to-face time of a patient visit (documented in the note above) non-face-to-face time: obtaining and reviewing outside history, ordering and reviewing medications, tests or procedures, care coordination (communications with other health care professionals or caregivers) and documentation in the medical record.

## 2022-09-27 ENCOUNTER — Telehealth: Payer: Self-pay

## 2022-09-27 DIAGNOSIS — Z09 Encounter for follow-up examination after completed treatment for conditions other than malignant neoplasm: Secondary | ICD-10-CM | POA: Diagnosis not present

## 2022-09-27 DIAGNOSIS — Z800 Family history of malignant neoplasm of digestive organs: Secondary | ICD-10-CM | POA: Diagnosis not present

## 2022-09-27 DIAGNOSIS — Z86010 Personal history of colonic polyps: Secondary | ICD-10-CM | POA: Diagnosis not present

## 2022-09-27 DIAGNOSIS — K648 Other hemorrhoids: Secondary | ICD-10-CM | POA: Diagnosis not present

## 2022-09-27 DIAGNOSIS — K635 Polyp of colon: Secondary | ICD-10-CM | POA: Diagnosis not present

## 2022-09-27 LAB — HM COLONOSCOPY

## 2022-09-27 NOTE — Progress Notes (Signed)
Care Management & Coordination Services Pharmacy Team  Reason for Encounter: Appointment Reminder  Contacted patient to confirm telephone appointment with Charlene Brooke, PharmD on 09/28/2022 at 8:45.  Unsuccessful outreach. Left voicemail for patient to return call.  Star Rating Drugs:  Medication:  Last Fill: Day Supply Atorvastatin 10 mg 09/03/2022 90  Care Gaps: Annual wellness visit in last year? Yes 05/01/2022  If Diabetic: Last eye exam / retinopathy screening: Up to date Last diabetic foot exam: Up to date  Charlene Brooke, PharmD notified  Marijean Niemann, [place] Assistant [phone]

## 2022-09-28 ENCOUNTER — Ambulatory Visit: Payer: Medicare Other | Admitting: Pharmacist

## 2022-09-28 NOTE — Progress Notes (Signed)
Care Management & Coordination Services Pharmacy Note  09/28/2022 Name:  [name] MRN:  [id number] DOB:  1946/02/21  Summary: -DM: A1c 5.6% (03/2022) in normal range; on Glyxambi (Jardiance + Tradjenta), she may not need combo therapy much longer if A1c remains <6% -HF/AFIB: follows with cardiology, she has been stable without recurrence of Afib or HF symptoms for a while now   Recommendations -No med changes  Follow up plan: -Pharmacist follow up PRN -PCP appt 05/03/23 (CPE)    Subjective: [name] is an 77 y.o. year old female who is a primary patient of Bedsole, Amy E, MD.  The care coordination team was consulted for assistance with disease management and care coordination needs.    Engaged with patient by telephone for follow up visit.  Recent office visits: 09/04/22 Dr Diona Browner OV: acute diarrhea, fatigue, itching - talk with podiatry about reducing gabapentin. Try cetaphil/aquaphor cream for moisture.  08/16/22 Dr Diona Browner OV: leg cramps - increase water, try yoga/gentle stretching  05/22/22 Dr Diona Browner OV: anke sprain  05/01/22 Dr Diona Browner OV: annual - stable. No changes  Recent consult visits: 09/26/22 NP Causey (Oncology): Survivorship clinic. Mammogram due 01/2023. Repeat DEXA 06/2024.  09/05/22 Dr Milinda [facility]): reduce gabapentin to 300 mg - 1 AM, 1 PM, 2 HS  08/14/22 Dr Lindi Adie (Oncology): DCIS - tolerating anastrozole well  06/19/22 Dr Neldon Mc (Allergy): asthma - update Spiriva to 2.5 mcg. Continue advair, montelukast, Xolair.  06/18/22 Dr Lindi Adie (Oncology): DCIS - start anastrozole x 5 years.  05/01/22 Dr Cruzita Lederer (Endocrine): Graves - TSH 4.87. Reduce methimazole to 5 mg QOD.  04/25/22 Dr Sallyanne Kuster (Cardiology): Afib - NSR today, bradycardia. S/p cardioversion 11/2020. No changes  Hospital visits: 03/29/22 Planned admission [facility]): L breast lumpectomy   Objective:  Lab Results  Component Value Date   CREATININE 1.04 04/24/2022   BUN 13 04/24/2022    GFR 52.49 (L) 04/24/2022   EGFR 53 (L) 11/21/2020   GFRNONAA 57 (L) 02/28/2022   GFRAA 62 01/04/2020   NA 140 04/24/2022   K 4.2 04/24/2022   CALCIUM 9.3 04/24/2022   CO2 26 04/24/2022   GLUCOSE 91 04/24/2022    Lab Results  Component Value Date/Time   HGBA1C 5.6 03/22/2022 12:00 PM   HGBA1C 5.3 11/03/2021 02:29 PM   HGBA1C 5.7 04/27/2021 12:39 PM   HGBA1C 6.2 08/14/2016 12:00 AM   HGBA1C 6.1 01/27/2016 12:00 AM   GFR 52.49 (L) 04/24/2022 10:03 AM   GFR 68.96 12/29/2021 11:03 AM    Last diabetic Eye exam:  Lab Results  Component Value Date/Time   HMDIABEYEEXA No Retinopathy 06/12/2022 12:00 AM    Last diabetic Foot exam:  Lab Results  Component Value Date/Time   HMDIABFOOTEX done 04/16/2022 12:00 AM     Lab Results  Component Value Date   CHOL 106 04/24/2022   HDL 56.40 04/24/2022   LDLCALC 40 04/24/2022   TRIG 49.0 04/24/2022   CHOLHDL 2 04/24/2022       Latest Ref Rng & Units 04/24/2022   10:03 AM 02/28/2022    8:24 AM 12/20/2021    4:42 AM  Hepatic Function  Total Protein 6.0 - 8.3 g/dL 7.1  7.0  6.4   Albumin 3.5 - 5.2 g/dL 4.0  4.0  3.1   AST 0 - 37 U/L 17  16  33   ALT 0 - 35 U/L 9  10  12    Alk Phosphatase 39 - 117 U/L 88  80  90  Total Bilirubin 0.2 - 1.2 mg/dL 0.8  0.8  2.4     Lab Results  Component Value Date/Time   TSH 2.96 06/20/2022 11:30 AM   TSH 4.87 05/01/2022 02:47 PM   FREET4 0.77 06/20/2022 11:30 AM   FREET4 0.75 05/01/2022 02:47 PM       Latest Ref Rng & Units 02/28/2022    8:24 AM 12/29/2021   11:03 AM 12/23/2021    5:44 AM  CBC  WBC 4.0 - 10.5 K/uL 6.5  6.9  7.3   Hemoglobin 12.0 - 15.0 g/dL 12.2  10.9  11.6   Hematocrit 36.0 - 46.0 % 36.7  33.0  34.8   Platelets 150 - 400 K/uL 253  425.0  284     Lab Results  Component Value Date/Time   VD25OH 65.41 01/12/2021 12:15 PM   VD25OH 23 (L) 08/29/2018 04:15 PM   VD25OH 31.13 03/28/2018 11:49 AM   VITAMINB12 776 01/12/2021 12:15 PM   VITAMINB12 878 08/29/2018 04:15 PM     Clinical ASCVD: No  The ASCVD Risk score (Arnett DK, et al., 2019) failed to calculate for the following reasons:   The valid total cholesterol range is 130 to 320 mg/dL        09/12/2022    4:26 PM 09/07/2022   10:41 AM 08/16/2022   10:59 AM  Depression screen PHQ 2/9  Decreased Interest 0 0 0  Down, Depressed, Hopeless 0 1 0  PHQ - 2 Score 0 1 0  Altered sleeping   2  Tired, decreased energy   0  Change in appetite   0  Feeling bad or failure about yourself    0  Trouble concentrating   0  Moving slowly or fidgety/restless   0  Suicidal thoughts   0  PHQ-9 Score   2  Difficult doing work/chores   Not difficult at all     Social History   Tobacco Use  Smoking Status Never  Smokeless Tobacco Never   BP Readings from Last 3 Encounters:  09/26/22 120/80  09/04/22 110/66  08/16/22 130/76   Pulse Readings from Last 3 Encounters:  09/26/22 77  09/04/22 91  08/16/22 67   Wt Readings from Last 3 Encounters:  09/26/22 192 lb 14.4 oz (87.5 kg)  09/04/22 193 lb (87.5 kg)  08/16/22 202 lb 6 oz (91.8 kg)   BMI Readings from Last 3 Encounters:  09/26/22 32.85 kg/m  09/04/22 32.87 kg/m  08/16/22 34.47 kg/m    Allergies  Allergen Reactions   Dilantin [Phenytoin] Swelling    facial swelling   Latex Hives   Oxycodone Nausea And Vomiting and Nausea Only    Abdominal Pain, Vomiting   Penicillins Nausea And Vomiting and Swelling    Has patient had a PCN reaction causing immediate rash, facial/tongue/throat swelling, SOB or lightheadedness with hypotension patient had a PCN reaction causing severe rash involving mucus membranes or skin necrosis: [id number] Has patient had a PCN reaction that required hospitalization/No Has patient had a PCN reaction occurring within the last 10 years: No If all of the above answers are "NO", then may proceed with Cephalosporin use.      Codeine Nausea Only    Tolerates tylenol with codeine   Hydrocodone Nausea And Vomiting    Nickel Hives and Rash   Pregabalin Itching and Other (See Comments)    headaches/problem w/vision    Medications Reviewed Today     Reviewed by Charlton Haws, [facility] (Pharmacist) on 09/28/22  at 0900  Med List Status: <None>   Medication Order Taking? Sig Documenting Provider Last Dose Status Informant  acetaminophen (TYLENOL) 500 MG tablet [id number] Yes Take 500-1,000 mg by mouth every 6 (six) hours as needed for moderate pain. [provider] Taking Active Self  ADVAIR HFA 230-21 MCG/ACT inhaler [id number] Yes 2 puffs 1-2 times daily depending on disease activity. Kozlow, Donnamarie Poag, MD Taking Active   albuterol (VENTOLIN HFA) 108 (90 Base) MCG/ACT inhaler [id number] Yes INHALE 2 PUFFS BY MOUTH EVERY 6 HOURS AS NEEDED FOR WHEEZING OR SHORTNESS OF BREATH Kozlow, Donnamarie Poag, MD Taking Active   anastrozole (ARIMIDEX) 1 MG tablet [id number] Yes TAKE 1 TABLET(1 MG) BY MOUTH DAILY Nicholas Lose, MD Taking Active   Artificial Saliva (BIOTENE DRY MOUTH) LOZG [id number] Yes Use as directed 1 lozenge in the mouth or throat daily as needed (dry mouth). [provider] Taking Active Self  atorvastatin (LIPITOR) 10 MG tablet [id number] Yes TAKE 1 TABLET(10 MG) BY MOUTH DAILY Bedsole, Amy E, MD Taking Active   Blood Glucose Monitoring Suppl (ONETOUCH VERIO) w/Device KIT [id number] Yes Use to check blood sugar up to 2 times a day [name], MD Taking Active Self  CALCIUM PO [id number] Yes Take 1,000 mg by mouth daily. [provider] Taking Active   Coenzyme Q10 (COQ10) 100 MG CAPS [id number] Yes Take 100 mg by mouth daily. [provider] Taking Active Self  cyclobenzaprine (FLEXERIL) 10 MG tablet [id number] Yes Take 0.5-1 tablets (5-10 mg total) by mouth at bedtime as needed for muscle spasms. [name], MD Taking Active Self  desloratadine (CLARINEX) 5 MG tablet [id number] Yes Take 1 tablet (5 mg total) by mouth daily. Kozlow, Donnamarie Poag, MD Taking Active   EPINEPHrine 0.3  mg/0.3 mL IJ SOAJ injection [id number] Yes USE AS DIRECTED FOR LIFE THREATENING ALLERGIC REACTIONS Kozlow, Donnamarie Poag, MD Taking Active Self  estradiol (ESTRACE) 0.1 MG/GM vaginal cream [id number] Yes Place 1 Applicatorful vaginally 2 (two) times a week. (As Needed) [provider] Taking Active   famotidine (PEPCID) 40 MG tablet [id number] Yes Take 1 (ONE) tablet by mouth every evening. Kozlow, Donnamarie Poag, MD Taking Active   fluticasone [facility] HFA) 220 MCG/ACT inhaler [id number] Yes Inhale 2 puffs into the lungs 2 (two) times daily as needed (shortness of breath). [provider] Taking Active Self  fluticasone-salmeterol (ADVAIR HFA) 230-21 MCG/ACT inhaler [id number] Yes 2 inhalations 1-2 times a day depending on disease activity Kozlow, Donnamarie Poag, MD Taking Active   gabapentin (NEURONTIN) 300 MG capsule [id number] Yes Take one capsule in the morning, one at lunch and two at bedtime [place], Max T, DPM Taking Active   glucose blood (ONETOUCH VERIO) test strip [id number] Yes 1 strip 2 (two) times daily [provider] Taking Active Self  GLYXAMBI 10-5 MG TABS [id number] Yes TAKE 1 TABLET BY MOUTH DAILY Bedsole, Amy E, MD Taking Active   hydrOXYzine (ATARAX/VISTARIL) 10 MG tablet [id number] Yes Take 1 tablet (10 mg total) by mouth daily as needed for itching. [name], MD Taking Active Self  ipratropium (ATROVENT) 0.06 % nasal spray [id number] Yes 1-2 sprays in each nostril 1-2 times per day as needed to dry nose Kozlow, Donnamarie Poag, MD Taking Active Self  irbesartan (AVAPRO) 150 MG tablet [id number] Yes TAKE 1 TABLET(150 MG) BY MOUTH DAILY Croitoru, Mihai, MD Taking Active   Lactase (DAIRY-RELIEF PO) [id number] Yes Take 1 capsule by mouth daily as needed (eating dairy).  [provider] Taking Active  Self  Lancets (ONETOUCH DELICA PLUS [id number]) MISC [id number] Yes USE TO CHECK BLOOD SUGAR UP TO TWICE DAILY Bedsole, Amy E, MD Taking Active Self  levalbuterol (XOPENEX) 1.25 MG/3ML  nebulizer solution [id number] Yes USE 1 VIAL VIA NEBULIZER EVERY 6 HOURS AS NEEDED FOR SHORTNESS OF BREATH OR WHEEZING Kozlow, Donnamarie Poag, MD Taking Active   liver oil-zinc oxide (DESITIN) 40 % ointment [id number] Yes Apply 1 application topically as needed for irritation. [provider] Taking Active Self  Magnesium 500 MG TABS [id number] Yes Take 500 mg by mouth daily. [provider] Taking Active   Menthol, Topical Analgesic, (ABSORBINE PLUS JR EX) [id number] Yes Apply 1 patch topically daily as needed (pain). [provider] Taking Active Self  Menthol-Methyl Salicylate (SALONPAS JET SPRAY EX) [id number] Yes Apply 1 spray topically daily as needed (knee pain). [provider] Taking Active Self  METAMUCIL FIBER PO [id number] Yes Take 1 Dose by mouth daily. 1 dose = 1/2 tablespoon [provider] Taking Active Self  methimazole (TAPAZOLE) 5 MG tablet [id number] Yes Take 1 tablet (5 mg total) by mouth every other day. Philemon Kingdom, MD Taking Active   montelukast (SINGULAIR) 10 MG tablet [id number] Yes Take 1 tablet (10 mg total) by mouth at bedtime. For asthma control. Kozlow, Donnamarie Poag, MD Taking Active   Mouthwashes [facility] DRY MOUTH MT) [id number] Yes Use as directed 1 Dose in the mouth or throat daily as needed (dry mouth). [provider] Taking Active Self  nystatin (MYCOSTATIN) 100000 UNIT/ML suspension [id number] Yes 56mL swish and swallow after Advair 2 times daily. [name], MD Taking Active Self  omalizumab Arvid Right) injection 300 mg [id number]   [name], MD  Active            Med Note Delsa Sale, [place] S   Fri Mar 09, 2022 10:58 AM)    ondansetron (ZOFRAN-ODT) 8 MG disintegrating tablet [id number] Yes Take 1 tablet (8 mg total) by mouth every 8 (eight) hours as needed for nausea or vomiting. Lacretia Leigh, MD Taking Active Self  [facility] VERIO test strip [id number] Yes USE TO CHECK BLOOD SUGAR UP TO TWICE DAILY AS DIRECTED Bedsole, Amy E,  MD Taking Active Self  oxybutynin (DITROPAN-XL) 10 MG 24 hr tablet [id number] Yes Take 10 mg by mouth at bedtime. Bjorn Loser, MD Taking Active Self           Med Note Earl Gala Mar 20, 2022  4:23 PM) Takes oxybutynin and tolterodine both prescribed by Dr. Matilde Sprang.   pantoprazole (PROTONIX) 40 MG tablet [id number] Yes Take 1 (ONE) tablet by mouth every morning. Kozlow, Donnamarie Poag, MD Taking Active   Plecanatide (TRULANCE) 3 MG TABS [id number] Yes Take 3 mg by mouth daily as needed (constipation). [provider] Taking Active Self  Polyethyl Glycol-Propyl Glycol (SYSTANE OP) [id number] Yes Place 1 drop into both eyes daily as needed (dry eyes). [provider] Taking Active Self  polyethylene glycol powder (GLYCOLAX/MIRALAX) 17 GM/SCOOP powder [id number] Yes Take 1 Container by mouth once. [provider] Taking Active   PRESCRIPTION MEDICATION [id number] Yes Inhale into the lungs at bedtime. CPAP [provider] Taking Active Self  Tiotropium Bromide Monohydrate (SPIRIVA RESPIMAT) 1.25 MCG/ACT AERS [id number] Yes Inhale 2 each into the lungs daily. [provider] Taking Active Self  tolterodine (DETROL LA) 4 MG 24 hr capsule [id number] Yes Take 4 mg by mouth at bedtime. Bjorn Loser, MD Taking Active Self  Med Note ([place] Mar 20, 2022  4:23 PM) Takes oxybutynin and tolterodine both prescribed by Dr. Matilde Sprang.   XARELTO 20 MG TABS tablet [id number] Yes TAKE 1 TABLET(20 MG) BY MOUTH DAILY WITH SUPPER Croitoru, Mihai, MD Taking Active   Zinc 25 MG TABS [id number] Yes Take 25 mg by mouth daily. [provider] Taking Active             SDOH:  (Social Determinants of Health) assessments and interventions performed: No SDOH Interventions    Flowsheet Row Care Coordination from 09/12/2022 in [place] from 02/28/2022 in [place] at [place] Management from 12/27/2020 in [place] [facility] [place] from 03/25/2017 in [place] at [place] Interventions Intervention Not Indicated Intervention Not Indicated -- --  Housing Interventions Intervention Not Indicated -- -- --  Transportation Interventions Intervention Not Indicated Intervention Not Indicated -- --  Depression Interventions/Treatment  -- -- -- --  [pt is being referred to PCP for further evaluation]  Financial Strain Interventions Intervention Not Indicated Other (Comment)  [cancer foundations] Intervention Not Indicated --       Medication Assistance: None required.  Patient affirms current coverage meets needs.  Medication Access: Within the past 30 days, how often has patient missed a dose of medication? 0 Is a pillbox or other method used to improve adherence? Yes  Factors that may affect medication adherence? no barriers identified Are meds synced by current pharmacy? No  Are meds delivered by current pharmacy? No  Does patient experience delays in picking up medications due to transportation concerns? No   [facility] Services Reviewed: Is patient disadvantaged to use [facility]?: Yes  Current Rx insurance plan: [place] Name and location of Current pharmacy:  [facility] [id number], [name] [address] Phone: [phone] Fax: [phone]  [facility] services reviewed with patient today?: No  Patient requests to transfer care to [facility]?: No  Reason patient declined to change pharmacies: Disadvantaged due to insurance/mail order  Compliance/Adherence/Medication fill history: Care Gaps: UACR (never done) - A1c in normal range. Hx DM  Star-Rating Drugs: Atorvastatin - PDC 98% Irbesartan - PDC  99% Glyxambi - PDC 94%    ASSESSMENT / PLAN  Heart Failure / Hypertension (Goal: BP < 140/90) -Controlled  -Last ejection fraction: 60-65% (Date: 10/2020) - per Dr Sallyanne Kuster she does not have significant LV diastolic dysfunction, issues related to OSA and Afib. -HF type: HFpEF (EF > 50%); NYHA Class: n/a -Current treatment: Irbesartan 150 mg daily - Appropriate, Effective, Safe, Accessible Empagliflozin 10 mg (part of Glyxambi) - Appropriate, Effective, Safe, Accessible -Medications previously tried: amlodipine, diltiazem, valsartan  -Educated on Benefits of medications for managing symptoms and prolonging life Importance of weighing daily; if you gain more than 3 pounds in one day or 5 pounds in one week, contact provider -Recommended to continue current medication  Atrial Fibrillation (Goal: prevent stroke and major bleeding) -Controlled - managed by cardiology -Hx DVT as well -Current treatment: Xarelto 20 mg daily PM - Appropriate, Effective, Safe, Accessible -Medications previously tried: none reported -Xarelto - encouraged to take same time every day with largest meal. She is taking in morning at various times (8A-12P). Denies aspirin, aleve, or  other NSAID use. The only thing she takes for pain is Tylenol.  -Counseled on importance of adherence to anticoagulant exactly as prescribed; bleeding risk associated with Xarelto and importance of self-monitoring for signs/symptoms of bleeding; avoidance of NSAIDs due to increased bleeding risk with anticoagulants; -Recommended to continue current medication  Hyperlipidemia: (LDL goal < 100) -Controlled - LDL 40 (04/2022) at goal -Current treatment: Atorvastatin 10 mg daily - Appropriate, Effective, Safe, Accessible CoQ10 - Appropriate, Effective, Safe, Accessible -Medications previously tried: n/a  -Educated on Cholesterol goals;  -Recommended to continue current medication  Diabetes (A1c goal <7%) -Controlled - A1c 5.6% (03/2022)  at goal in normal range -Current home glucose readings: typically does not check BG -Denies hypoglycemic/hyperglycemic symptoms -Current medications: Glyxambi (empagliflozin-linagliptin) 10-5 mg daily - Appropriate, Effective, Safe, Accessible -Medications previously tried: metformin  -Educated on A1c and blood sugar goal -Recommend to continue current medication  Asthma (Goal: control symptoms and prevent exacerbations) -Controlled -Follows with allergy/asthma (Dr Neldon Mc) - per MD (03/21/22) she used Advair + Spiriva consistently and adds on albuterol and Flovent during increased asthma activity -Pulmonary function testing: not on file -Exacerbations requiring treatment in last 6 months: 0 -Current treatment  Albuterol HFA PRN - Appropriate, Effective, Safe, Accessible Advair 230-21 mcg  HFA 2 puffs BID -Appropriate, Effective, Safe, Accessible Spiriva Respimat 2.5 mcg 2 puff daily -Appropriate, Effective, Safe, Accessible Flovent 220 mcg HFA 2 puffs BID (PRN) -Appropriate, Effective, Safe, Accessible Levalbuterol nebuilizer PRN Montelukast 10 mg daily -Appropriate, Effective, Safe, Accessible Desloratadine 5 mg daily - Appropriate, Effective, Safe, Accessible Famotidine 40 mg daily -Appropriate, Effective, Safe, Accessible Ipratropium 0.06% nasal spray -Appropriate, Effective, Safe, Accessible Hydroxyzine 10 mg PRN itching -Appropriate, Effective, Safe, Accessible Xolair injections -Appropriate, Effective, Safe, Accessible Epi-Pen PRN -Medications previously tried: n/a  -Patient reports consistent use of maintenance inhaler -Counseled on Differences between maintenance and rescue inhalers -Recommended to continue current medication  OAB / Urinary incontinence (Goal: manage symptoms) -Controlled - per pt report, she takes both oxybutynin and tolterodine per instructions from urologist -Ward with Dr Matilde Sprang -Current treatment  Oxbutynin ER 10 mg daily -Appropriate, Effective,  Safe, Accessible Tolterodine ER 4 mg daily -Appropriate, Effective, Safe, Accessible Estradiol 0.1 mg/gm vaginal cream - Appropriate, Effective, Safe, Accessible -Medications previously tried: Myrbetriq, fesoterodine, solifenacin -Recommended to continue current medication  Chronic constipation (Goal: regular BM) -Controlled - per pt report she is having 1 BM daily right now; she is not using Trulance regularly -Current treatment  Miralax daily - Appropriate, Effective, Safe, Accessible Trulance 3 mg PRN -Appropriate, Effective, Safe, Accessible Metamucil PRN - Appropriate, Effective, Safe, Accessible -Medications previously tried: n/a -Recommended to continue current medication  Osteoarthritis (Goal: Reduce pain) -Controlled- per pt report -s/p knee surgery 09/2021; she uses ice and heat as well -Current treatment  Tylenol 650 mg - 2 tab PRN - Appropriate, Effective, Safe, Accessible Tylenol #3 (05/22/22 #30) - completed Gabapentin 300 mg - 1 AM, 1 PM, 2 HS - Appropriate, Effective, Safe, Accessible -Medications previously tried: multiple  -Recommend to continue current medication  Breast cancer (DCIS) -Follows with oncology (Dr Lindi Adie), in Survivorship clinic now -Dx 02/23/2022; resection 03/2022 followed by adjuvant radiation, now on anastrozole for planned 5 years   Charlene Brooke, PharmD, [place] Pharmacist [employer] [place] [phone]

## 2022-10-01 DIAGNOSIS — K635 Polyp of colon: Secondary | ICD-10-CM | POA: Diagnosis not present

## 2022-10-03 ENCOUNTER — Telehealth: Payer: Self-pay | Admitting: Family Medicine

## 2022-10-03 ENCOUNTER — Other Ambulatory Visit: Payer: Medicare Other

## 2022-10-03 ENCOUNTER — Encounter: Payer: Self-pay | Admitting: Family Medicine

## 2022-10-03 ENCOUNTER — Encounter: Payer: Self-pay | Admitting: Podiatry

## 2022-10-03 NOTE — Telephone Encounter (Signed)
See MyChart message

## 2022-10-03 NOTE — Telephone Encounter (Signed)
Spoke with Ms. Cerruti.  She went to see her Podiatrist and her Left Big Toe is longer that her right.  She spoke with her Podiatrist about possibly surgically shortening left big toe.  She states he told her he could do that but she would have to be officer her feet for 12 weeks and that she would also need to check with her PCP to see with her health and surgical history if Dr. Diona Browner would recommend that she have this done.  Ms. Fassler states you can respond to her through [place].

## 2022-10-03 NOTE — Telephone Encounter (Signed)
Patient is requesting a phone call regarding her Podiatrist wanting  to ask Dr. Diona Browner about her having a surgery on her toe.

## 2022-10-04 ENCOUNTER — Ambulatory Visit: Payer: Self-pay | Admitting: *Deleted

## 2022-10-04 ENCOUNTER — Ambulatory Visit (INDEPENDENT_AMBULATORY_CARE_PROVIDER_SITE_OTHER): Payer: Medicare Other | Admitting: Family Medicine

## 2022-10-04 ENCOUNTER — Encounter: Payer: Self-pay | Admitting: Family Medicine

## 2022-10-04 VITALS — BP 120/60 | HR 78 | Temp 97.9°F | Ht 64.25 in | Wt 198.5 lb

## 2022-10-04 DIAGNOSIS — M1712 Unilateral primary osteoarthritis, left knee: Secondary | ICD-10-CM | POA: Diagnosis not present

## 2022-10-04 DIAGNOSIS — M79672 Pain in left foot: Secondary | ICD-10-CM | POA: Diagnosis not present

## 2022-10-04 NOTE — Progress Notes (Signed)
[name] T. Alicia Spatz, MD, [place] at [facility] [name], [address]  Phone: [phone]  FAX: [place] - 77 y.o. female  MRN [id number]  Date of Birth: 04-Jul-1946  Date: 10/04/2022  PCP: [name], MD  Referral: [name], MD  Chief Complaint  Patient presents with   Foot Pain    Popping in Left Foot   Knee Pain    Left-radiates down leg   Subjective:   [name] is a 77 y.o. very pleasant female patient with Body mass index is 33.81 kg/m. who presents with the following:  Foot on the L, felt some popping in his foot yesterday, and it was really hurting.   This has been somewhat variable in location.  Yesterday she had pain on the plantar surface of the forefoot, and now she has pain more in the midfoot region.  She also has some pain off and on at the great toe which she has profuse callus and evidence of repeated contact trauma.  She also now has some pain in the left knee.  This is all started within the last 24 hours.  She has pain medially greater than laterally.  She has no significant effusion.  She has not had any giving way.  No locking up of the joint.  She has not tried anything at home.  She is on Xarelto and cannot take NSAIDs.  She does have a history of prior right-sided total knee arthroplasty.  Review of Systems is noted in the HPI, as appropriate  Objective:   BP 120/60   Pulse 78   Temp 97.9 F (36.6 C) (Temporal)   Ht 5' 4.25" (1.632 m)   Wt 198 lb 8 oz (90 kg)   SpO2 99%   BMI 33.81 kg/m   GEN: No acute distress; alert,appropriate. PULM: Breathing comfortably in no respiratory distress PSYCH: Normally interactive.   Left knee: Full extension.  Flexion to 105.  Stable to varus and valgus stress.  She does have pain with loading the patella.  Medial joint line tenderness.  Minimal lateral joint line tenderness. Bounce home testing and McMurray's  cause some pain.  No mechanical symptoms.  Left foot with extensive callus formation on the first digit.  There is also some healing scab formation.  No tenderness along the plantar aspect of the foot.  Minimal forefoot tenderness.  She does have some relative nonfocal midfoot tenderness.  No tenderness at the talus, navicular.  Nontender at the malleoli or distal tib-fib. All ligamentous structures are nontender.  She does have a modestly positive drawer sign on the left compared to the right.  Increased give.  Laboratory and Imaging Data:  Assessment and Plan:     ICD-10-CM   1. Acute pain of left foot  M79.672     2. Primary osteoarthritis of left knee  M17.12      I am good to have her apply some urea cream to the first digit, and I suspect that this will soften this up and help some with pain from direct contact.  Nonfocal foot pain that is changed positions over the last 24 hours.  Think this seems relatively benign, and is not hurting where he was hurting yesterday.  I would have her use some Voltaren gel and ice the foot over the next few days.  Acute on chronic left-sided knee osteoarthritis with exacerbation.  Think this is mildly  secondary to some gait disturbance from foot pain yesterday.  I would ice the foot and use some Voltaren gel.  She can also take Tylenol.  Patient Instructions  Urea Cream 40% - apply to dry toe twice a day for very dry skin   Ice your foot and knee twice a day for the next few days - 20 minutes each time  Voltaren 1% gel, over the counter You can apply up to 4 times a day  This can be applied to any joint: knee, wrist, fingers, elbows, shoulders, feet and ankles. Can apply to any tendon: tennis elbow, achilles, tendon, rotator cuff or any other tendon.  Minimal is absorbed in the bloodstream: ok with oral anti-inflammatory or a blood thinner.  Cost is about 9 dollars    Disposition: No follow-ups on file.  [employer] speech-to-text  software was used for transcription in this dictation.  Possible transcriptional errors can occur using Editor, commissioning.   Signed,  Maud Deed. Alicia Stiggers, MD   Outpatient Encounter Medications as of 10/04/2022  Medication Sig   acetaminophen (TYLENOL) 500 MG tablet Take 500-1,000 mg by mouth every 6 (six) hours as needed for moderate pain.   ADVAIR HFA 230-21 MCG/ACT inhaler 2 puffs 1-2 times daily depending on disease activity.   albuterol (VENTOLIN HFA) 108 (90 Base) MCG/ACT inhaler INHALE 2 PUFFS BY MOUTH EVERY 6 HOURS AS NEEDED FOR WHEEZING OR SHORTNESS OF BREATH   anastrozole (ARIMIDEX) 1 MG tablet TAKE 1 TABLET(1 MG) BY MOUTH DAILY   Artificial Saliva (BIOTENE DRY MOUTH) LOZG Use as directed 1 lozenge in the mouth or throat daily as needed (dry mouth).   atorvastatin (LIPITOR) 10 MG tablet TAKE 1 TABLET(10 MG) BY MOUTH DAILY   Blood Glucose Monitoring Suppl (ONETOUCH VERIO) w/Device KIT Use to check blood sugar up to 2 times a day   CALCIUM PO Take 1,000 mg by mouth daily.   Coenzyme Q10 (COQ10) 100 MG CAPS Take 100 mg by mouth daily.   cyclobenzaprine (FLEXERIL) 10 MG tablet Take 0.5-1 tablets (5-10 mg total) by mouth at bedtime as needed for muscle spasms.   desloratadine (CLARINEX) 5 MG tablet Take 1 tablet (5 mg total) by mouth daily.   EPINEPHrine 0.3 mg/0.3 mL IJ SOAJ injection USE AS DIRECTED FOR LIFE THREATENING ALLERGIC REACTIONS   estradiol (ESTRACE) 0.1 MG/GM vaginal cream Place 1 Applicatorful vaginally 2 (two) times a week. (As Needed)   famotidine (PEPCID) 40 MG tablet Take 1 (ONE) tablet by mouth every evening.   fluticasone (FLOVENT HFA) 220 MCG/ACT inhaler Inhale 2 puffs into the lungs 2 (two) times daily as needed (shortness of breath).   fluticasone-salmeterol (ADVAIR HFA) 230-21 MCG/ACT inhaler 2 inhalations 1-2 times a day depending on disease activity   gabapentin (NEURONTIN) 300 MG capsule Take one capsule in the morning, one at lunch and two at bedtime   glucose  blood (ONETOUCH VERIO) test strip 1 strip 2 (two) times daily   GLYXAMBI 10-5 MG TABS TAKE 1 TABLET BY MOUTH DAILY   hydrOXYzine (ATARAX/VISTARIL) 10 MG tablet Take 1 tablet (10 mg total) by mouth daily as needed for itching.   ipratropium (ATROVENT) 0.06 % nasal spray 1-2 sprays in each nostril 1-2 times per day as needed to dry nose   irbesartan (AVAPRO) 150 MG tablet TAKE 1 TABLET(150 MG) BY MOUTH DAILY   Lactase (DAIRY-RELIEF PO) Take 1 capsule by mouth daily as needed (eating dairy).    Lancets (ONETOUCH DELICA PLUS 123XX123) MISC USE  TO CHECK BLOOD SUGAR UP TO TWICE DAILY   levalbuterol (XOPENEX) 1.25 MG/3ML nebulizer solution USE 1 VIAL VIA NEBULIZER EVERY 6 HOURS AS NEEDED FOR SHORTNESS OF BREATH OR WHEEZING   liver oil-zinc oxide (DESITIN) 40 % ointment Apply 1 application topically as needed for irritation.   Magnesium 500 MG TABS Take 500 mg by mouth daily.   Menthol, Topical Analgesic, (ABSORBINE PLUS JR EX) Apply 1 patch topically daily as needed (pain).   Menthol-Methyl Salicylate (SALONPAS JET SPRAY EX) Apply 1 spray topically daily as needed (knee pain).   METAMUCIL FIBER PO Take 1 Dose by mouth daily. 1 dose = 1/2 tablespoon   methimazole (TAPAZOLE) 5 MG tablet Take 1 tablet (5 mg total) by mouth every other day.   montelukast (SINGULAIR) 10 MG tablet Take 1 tablet (10 mg total) by mouth at bedtime. For asthma control.   Mouthwashes (BIOTENE DRY MOUTH MT) Use as directed 1 Dose in the mouth or throat daily as needed (dry mouth).   nystatin (MYCOSTATIN) 100000 UNIT/ML suspension 75mL swish and swallow after Advair 2 times daily.   ondansetron (ZOFRAN-ODT) 8 MG disintegrating tablet Take 1 tablet (8 mg total) by mouth every 8 (eight) hours as needed for nausea or vomiting.   ONETOUCH VERIO test strip USE TO CHECK BLOOD SUGAR UP TO TWICE DAILY AS DIRECTED   oxybutynin (DITROPAN-XL) 10 MG 24 hr tablet Take 10 mg by mouth at bedtime.   pantoprazole (PROTONIX) 40 MG tablet Take 1 (ONE)  tablet by mouth every morning.   Plecanatide (TRULANCE) 3 MG TABS Take 3 mg by mouth daily as needed (constipation).   Polyethyl Glycol-Propyl Glycol (SYSTANE OP) Place 1 drop into both eyes daily as needed (dry eyes).   polyethylene glycol powder (GLYCOLAX/MIRALAX) 17 GM/SCOOP powder Take 1 Container by mouth once.   PRESCRIPTION MEDICATION Inhale into the lungs at bedtime. CPAP   Tiotropium Bromide Monohydrate (SPIRIVA RESPIMAT) 1.25 MCG/ACT AERS Inhale 2 each into the lungs daily.   tolterodine (DETROL LA) 4 MG 24 hr capsule Take 4 mg by mouth at bedtime.   XARELTO 20 MG TABS tablet TAKE 1 TABLET(20 MG) BY MOUTH DAILY WITH SUPPER   Zinc 25 MG TABS Take 25 mg by mouth daily.   Facility-Administered Encounter Medications as of 10/04/2022  Medication   omalizumab Arvid Right) injection 300 mg

## 2022-10-04 NOTE — Telephone Encounter (Signed)
Seen Mychart response

## 2022-10-04 NOTE — Patient Instructions (Signed)
Urea Cream 40% - apply to dry toe twice a day for very dry skin   Ice your foot and knee twice a day for the next few days - 20 minutes each time  Voltaren 1% gel, over the counter You can apply up to 4 times a day  This can be applied to any joint: knee, wrist, fingers, elbows, shoulders, feet and ankles. Can apply to any tendon: tennis elbow, achilles, tendon, rotator cuff or any other tendon.  Minimal is absorbed in the bloodstream: ok with oral anti-inflammatory or a blood thinner.  Cost is about 9 dollars

## 2022-10-04 NOTE — Patient Instructions (Addendum)
Visit Information  Thank you for taking time to visit with me today. Please don't hesitate to contact me if I can be of assistance to you.   Following are the goals we discussed today:   Goals Addressed             This Visit's Progress    Managing anxiety       . Activities and task to complete in order to accomplish goals.   EMOTIONAL / [place] with compliance of taking medication prescribed by Doctor Complete intake paperwork for [facility] Continue with self care strategies-patient confirms improvement in sleep, anxiety manageable-back at baseline, plans to follow up with mental health therapist and continue with yoga                    If you are experiencing a Mental Health or [place] or need someone to talk to, please call the Suicide and Crisis Lifeline: 988   Patient verbalizes understanding of instructions and care plan provided today and agrees to view in [place]. Active MyChart status and patient understanding of how to access instructions and care plan via MyChart confirmed with patient.     No further follow up required: patient to coordinated ongoing mental health treatment with [facility], [place] Worker  [facility] Care Management [phone]

## 2022-10-04 NOTE — Patient Outreach (Signed)
  Care Coordination   Follow Up Visit Note   10/04/2022 Name: [name] MRN: [id number] DOB: 1946-03-29  [name] is a 77 y.o. year old female who sees [name], MD for primary care. I spoke with  [name] by phone today.  What matters to the patients health and wellness today?  Management of anxiety, mental health follow up    Goals Addressed             This Visit's Progress    Managing anxiety       . Activities and task to complete in order to accomplish goals.   EMOTIONAL / [place] with compliance of taking medication prescribed by Doctor Complete intake paperwork for [facility] Continue with self care strategies-patient confirms improvement in sleep, anxiety manageable-back at baseline, plans to follow up with mental health therapist and continue with yoga                     SDOH assessments and interventions completed:  No     Care Coordination Interventions:  Yes, provided  Interventions Today    Flowsheet Row Most Recent Value  Chronic Disease   Chronic disease during today's visit Other  [anxiety]  General Interventions   General Interventions Discussed/Reviewed General Interventions Reviewed, [employer], Communication with  Communication with --  [[facility]-confirmed that referral was received, they have contacted patient intake paperwork mailed to patient, initial appointment will be scheduled once intake paperwork received]  Mental Health Interventions   Mental Health Discussed/Reviewed Mental Health Reviewed, Coping Strategies, Anxiety       Follow up plan: No further intervention required.   Encounter Outcome:  Pt. Visit Completed

## 2022-10-04 NOTE — Patient Outreach (Deleted)
  Care Coordination   {Initialfollowuphomecarecoordination:27762} Visit Note   10/04/2022 Name: [name] MRN: [id number] DOB: 1946-05-08  [name] is a 77 y.o. year old female who sees [name], MD for primary care. I {TYPECCVISIT:27766}  What matters to the patients health and wellness today?  ***      SDOH assessments and interventions completed:  {yes/no:20286}{THN Tip this will not be part of the note when signed-REQUIRED REPORT FIELD DO NOT DELETE (Optional):27901}     Care Coordination Interventions:  {INTERVENTIONS:27767} {THN Tip this will not be part of the note when signed-REQUIRED REPORT FIELD DO NOT DELETE (Optional):27901}  Follow up plan: {CCFOLLOWUP:27768}   Encounter Outcome:  {ENCOUTCOME:27770} {THN Tip this will not be part of the note when signed-REQUIRED REPORT FIELD DO NOT DELETE (Optional):27901}

## 2022-10-08 ENCOUNTER — Encounter: Payer: Self-pay | Admitting: Family Medicine

## 2022-10-10 ENCOUNTER — Ambulatory Visit: Payer: Medicare Other | Admitting: Hematology and Oncology

## 2022-10-15 ENCOUNTER — Telehealth: Payer: Self-pay | Admitting: Family Medicine

## 2022-10-15 NOTE — Telephone Encounter (Signed)
Handicap Placard Application placed in Dr. Rometta Emery office in box for signature.

## 2022-10-15 NOTE — Telephone Encounter (Signed)
Type of forms received: handicap parking placard  Routed to: bedsole's pool   Paperwork received by : Armandina Stammer    Individual made aware of 3-5 business day turn around (Y/N): Y   Form completed and patient made aware of charges(Y/N): Y    Faxed to : pt requested a call back once ppw is comp  Form location:  bedsole's folder

## 2022-10-16 ENCOUNTER — Ambulatory Visit: Payer: Medicare Other

## 2022-10-18 DIAGNOSIS — L298 Other pruritus: Secondary | ICD-10-CM | POA: Diagnosis not present

## 2022-10-19 ENCOUNTER — Telehealth: Payer: Self-pay | Admitting: Family Medicine

## 2022-10-19 NOTE — Telephone Encounter (Signed)
Reviewed paperwork that Ms. [name] dropped off.  It appears to be a record release form from [facility].  I call and spoke with Ms. [name] to see what exactly is needed.  I advised if they are requesting records,  we need to know what records they are needing and we will also need her to come back to office to sign the release form  Patient is not sure exactly what is needed so she will reach out the [facility] on Monday and will call me back to let me know what they say.

## 2022-10-19 NOTE — Telephone Encounter (Signed)
Type of forms received:   Routed to: bedsole's folder   Paperwork received by : Audree Camel    Individual made aware of 3-5 business day turn around (Y/N): Y   Form completed and patient made aware of charges(Y/N): Y    Faxed to : pt requested a call once ppw is comp @ [phone]  Form location:  bedsole's folder

## 2022-10-26 ENCOUNTER — Ambulatory Visit (INDEPENDENT_AMBULATORY_CARE_PROVIDER_SITE_OTHER): Payer: Medicare Other

## 2022-10-26 DIAGNOSIS — J4550 Severe persistent asthma, uncomplicated: Secondary | ICD-10-CM

## 2022-10-31 ENCOUNTER — Telehealth: Payer: Self-pay

## 2022-10-31 MED ORDER — ALBUTEROL SULFATE (2.5 MG/3ML) 0.083% IN NEBU: 2.5000 mg | INHALATION_SOLUTION | Freq: Four times a day (QID) | RESPIRATORY_TRACT | 1 refills | Status: DC | PRN

## 2022-10-31 NOTE — Addendum Note (Signed)
Addended by: Berna Bue on: 10/31/2022 01:30 PM   Modules accepted: Orders

## 2022-10-31 NOTE — Telephone Encounter (Signed)
Ok

## 2022-10-31 NOTE — Telephone Encounter (Signed)
Pt called stating that she called the DME regarding the red face. They informed her that they were going to be sending her a new mask in about 7-10 business days. Pt wanted to inform the provider so that she knows this is the reason she has not been able to use the cpap machine,

## 2022-10-31 NOTE — Telephone Encounter (Signed)
Pts insurance does not cover levalbuterol neb solution prefers are albuterol advise to change

## 2022-10-31 NOTE — Telephone Encounter (Signed)
Sent in albuterol neb solution tried calling pt but it just kept ringing no one asnwered and no voicemail came on

## 2022-11-06 ENCOUNTER — Telehealth: Payer: Self-pay | Admitting: *Deleted

## 2022-11-06 NOTE — Telephone Encounter (Signed)
Received call from pt with complaint of left breast soreness x1 day.  Pt denies any redness, swelling, or drainage. Pt states she recently lifted some heavy boxes. Per MD symptoms related to heavy lifting. Pt educated to rest and apply warm compress.  If symptoms do not improve or get worse, pt educated to contact our office for [facility] visit.  Pt verbalized understanding.

## 2022-11-14 DIAGNOSIS — F411 Generalized anxiety disorder: Secondary | ICD-10-CM | POA: Diagnosis not present

## 2022-11-19 ENCOUNTER — Ambulatory Visit: Payer: Medicare Other | Admitting: Podiatry

## 2022-11-20 ENCOUNTER — Encounter: Payer: Self-pay | Admitting: Podiatry

## 2022-11-20 ENCOUNTER — Ambulatory Visit (INDEPENDENT_AMBULATORY_CARE_PROVIDER_SITE_OTHER): Payer: Medicare Other | Admitting: Podiatry

## 2022-11-20 ENCOUNTER — Telehealth: Payer: Self-pay | Admitting: *Deleted

## 2022-11-20 DIAGNOSIS — G5751 Tarsal tunnel syndrome, right lower limb: Secondary | ICD-10-CM | POA: Diagnosis not present

## 2022-11-20 DIAGNOSIS — E1142 Type 2 diabetes mellitus with diabetic polyneuropathy: Secondary | ICD-10-CM

## 2022-11-20 DIAGNOSIS — G5761 Lesion of plantar nerve, right lower limb: Secondary | ICD-10-CM

## 2022-11-20 MED ORDER — TRIAMCINOLONE ACETONIDE 40 MG/ML IJ SUSP
20.0000 mg | Freq: Once | INTRAMUSCULAR | Status: AC
Start: 2022-11-20 — End: 2022-11-20
  Administered 2022-11-20: 20 mg

## 2022-11-20 NOTE — Progress Notes (Signed)
She presents today for follow-up of her nerve pain in the right foot.  States that recently is starting to get crazy with pain particularly at nighttime when she is on her foot.  She states that it starts in the ankle and radiates to the toes.  Objective: Vital signs stable oriented x 3 pulses remain palpable.  She has pain on palpation of the tibial nerve with radiating pain proximally and distally.  Assessment: Probable tarsal tunnel syndrome right foot.  Plan: Injected the area today with dexamethasone and local anesthetic.  Follow-up with her in the near future if necessary.

## 2022-11-20 NOTE — Telephone Encounter (Signed)
Patient has been updated thru voice message

## 2022-11-20 NOTE — Telephone Encounter (Signed)
Patient is calling to ask how long to soak her foot in ice, she also wanted to let the physician know the name of  cream mentioned during visit is: Urea 40%+ 2 % salicyclic acid.

## 2022-11-22 ENCOUNTER — Ambulatory Visit: Payer: Self-pay

## 2022-11-22 NOTE — Patient Outreach (Signed)
  Care Coordination   Follow Up Visit Note   11/22/2022 Name: [name] MRN: [id number] DOB: 1945/12/28  [name] is a 77 y.o. year old female who sees Excell Seltzer, MD for primary care. I spoke with  Gwynneth Albright by phone today.  What matters to the patients health and wellness today?  Patient states she is managing her anxiety better and denies any new incidents regarding anxiety exacerbation.  Patient states she is going to exercises classes weekly and has involved herself in a senior activity group.  She states she has met new people in her senior group and is really enjoying the social interaction.     Goals Addressed             This Visit's Progress    Patient stated: Management of health conditions       Interventions Today    Flowsheet Row Most Recent Value  Chronic Disease   Chronic disease during today's visit Other  [anxiety]  General Interventions   General Interventions Discussed/Reviewed General Interventions Reviewed, Doctor Visits  [evaluation of current treatment plan for anxiety and adherence to plan as established by provider. Assessed patients anxiety level.]  Doctor Visits Discussed/Reviewed Doctor Visits Reviewed  [reviewed scheduled/ upcoming provider appointments. Confirmed patient has follow up provider/ counseling appointments dates on calendar and confirmed patient has transportation for appointments.]  Exercise Interventions   Exercise Discussed/Reviewed Physical Activity  Physical Activity Discussed/Reviewed Physical Activity Reviewed  [Encouraged ongoing physical activity/ exercise]  Mental Health Interventions   Mental Health Discussed/Reviewed Mental Health Reviewed, Coping Strategies  [congratulated patient for becoming more active socially. Advised to continue incorporating anxiety coping strategies into daily routine]  Pharmacy Interventions   Pharmacy Dicussed/Reviewed Pharmacy Topics Reviewed  [medications reviewed and compliance  discussed.]               SDOH assessments and interventions completed:  No     Care Coordination Interventions:  Yes, provided   Follow up plan: Follow up call scheduled for 02/13/23    Encounter Outcome:  Pt. Visit Completed   George Ina RN,BSN,CCM [facility] Care Coordination [phone] direct line

## 2022-11-26 ENCOUNTER — Encounter: Payer: Self-pay | Admitting: Family Medicine

## 2022-11-26 ENCOUNTER — Ambulatory Visit (INDEPENDENT_AMBULATORY_CARE_PROVIDER_SITE_OTHER)
Admission: RE | Admit: 2022-11-26 | Discharge: 2022-11-26 | Disposition: A | Discharge status: Home / Self Care | Payer: Medicare Other | Source: Ambulatory Visit | Attending: Family Medicine | Admitting: Family Medicine

## 2022-11-26 ENCOUNTER — Ambulatory Visit (INDEPENDENT_AMBULATORY_CARE_PROVIDER_SITE_OTHER): Payer: Medicare Other | Admitting: Family Medicine

## 2022-11-26 VITALS — BP 120/78 | HR 76 | Temp 97.4°F | Ht 64.25 in | Wt 202.0 lb

## 2022-11-26 DIAGNOSIS — M25572 Pain in left ankle and joints of left foot: Secondary | ICD-10-CM | POA: Diagnosis not present

## 2022-11-26 DIAGNOSIS — M79672 Pain in left foot: Secondary | ICD-10-CM

## 2022-11-26 MED ORDER — PREDNISONE 20 MG PO TABS: ORAL_TABLET | ORAL | 0 refills | Status: DC

## 2022-11-26 MED ORDER — FLUCONAZOLE 150 MG PO TABS
ORAL_TABLET | ORAL | 0 refills | Status: DC
Start: 2022-11-26 — End: 2023-01-15

## 2022-11-26 NOTE — Progress Notes (Unsigned)
    Adriann Thau T. Aliegha Paullin, MD, CAQ Sports Medicine [facility] [address] [place], [address]  Phone: [phone]  FAX: [phone]  [name] - 77 y.o. female  MRN [id number]  Date of Birth: [birth year]  Date: 11/26/2022  PCP: Excell Seltzer, MD  Referral: Excell Seltzer, MD  Chief Complaint  Patient presents with   Ankle Pain    Left   Subjective:   [name] is a 77 y.o. very pleasant female patient with Body mass index is 34.4 kg/m. who presents with the following:  Not sure what could have happened, and yesterday it was feeling fine.  Was walking around, it would hurt really bad.  Has been on some crutches.   Diffuse lateral ankle Lateral lower leg distally  Around noon yesterday, started to get some severe pain.  Did not bump into anything. Feels like a sprained ankle.   Swollen    Review of Systems is noted in the HPI, as appropriate  Objective:   BP 120/78 (BP Location: Left Arm, Patient Position: Sitting, Cuff Size: Large)   Pulse 76   Temp (!) 97.4 F (36.3 C) (Temporal)   Ht 5' 4.25" (1.632 m)   Wt 202 lb (91.6 kg)   SpO2 97%   BMI 34.40 kg/m   GEN: No acute distress; alert,appropriate. PULM: Breathing comfortably in no respiratory distress PSYCH: Normally interactive.   Laboratory and Imaging Data:  Assessment and Plan:   ***

## 2022-11-27 ENCOUNTER — Telehealth: Payer: Self-pay | Admitting: Family Medicine

## 2022-11-27 ENCOUNTER — Ambulatory Visit: Payer: Medicare Other

## 2022-11-27 ENCOUNTER — Ambulatory Visit (INDEPENDENT_AMBULATORY_CARE_PROVIDER_SITE_OTHER): Payer: Medicare Other | Admitting: Allergy and Immunology

## 2022-11-27 VITALS — BP 138/82 | HR 104 | Temp 98.6°F | Resp 18 | Ht 64.0 in | Wt 200.0 lb

## 2022-11-27 DIAGNOSIS — T781XXD Other adverse food reactions, not elsewhere classified, subsequent encounter: Secondary | ICD-10-CM

## 2022-11-27 DIAGNOSIS — K219 Gastro-esophageal reflux disease without esophagitis: Secondary | ICD-10-CM

## 2022-11-27 DIAGNOSIS — J3089 Other allergic rhinitis: Secondary | ICD-10-CM | POA: Diagnosis not present

## 2022-11-27 DIAGNOSIS — J4550 Severe persistent asthma, uncomplicated: Secondary | ICD-10-CM | POA: Diagnosis not present

## 2022-11-27 MED ORDER — SPIRIVA RESPIMAT 1.25 MCG/ACT IN AERS: 1.0000 | INHALATION_SPRAY | Freq: Every day | RESPIRATORY_TRACT | 5 refills | Status: DC

## 2022-11-27 MED ORDER — IPRATROPIUM BROMIDE 0.06 % NA SOLN: NASAL | 5 refills | Status: DC

## 2022-11-27 MED ORDER — EPINEPHRINE 0.3 MG/0.3ML IJ SOAJ: INTRAMUSCULAR | 1 refills | Status: DC

## 2022-11-27 MED ORDER — ALBUTEROL SULFATE HFA 108 (90 BASE) MCG/ACT IN AERS: INHALATION_SPRAY | RESPIRATORY_TRACT | 1 refills | Status: DC

## 2022-11-27 MED ORDER — FLUTICASONE PROPIONATE HFA 220 MCG/ACT IN AERO: INHALATION_SPRAY | RESPIRATORY_TRACT | 5 refills | Status: DC

## 2022-11-27 MED ORDER — MONTELUKAST SODIUM 10 MG PO TABS: 10.0000 mg | ORAL_TABLET | Freq: Every day | ORAL | 1 refills | Status: DC

## 2022-11-27 MED ORDER — PANTOPRAZOLE SODIUM 40 MG PO TBEC: DELAYED_RELEASE_TABLET | ORAL | 1 refills | Status: DC

## 2022-11-27 NOTE — Addendum Note (Signed)
Addended by: Hannah Beat on: 11/27/2022 05:15 PM   Modules accepted: Orders

## 2022-11-27 NOTE — Telephone Encounter (Signed)
The patient would like to see [facility] Rheumatology, so I placed that consult.

## 2022-11-27 NOTE — Progress Notes (Signed)
[place] [facility] [place] [facility] [place] - [place]   Follow-up Note  Referring Provider: Excell Seltzer, MD Primary Provider: Excell Seltzer, MD Date of Office Visit: 11/27/2022  Subjective:   [name] (DOB: 1945/12/19) is a 77 y.o. female who [facility] in re-evaluation of the following:  HPI: [name] returns to this clinic in evaluation of asthma, allergic rhinitis, LPR, pulmonary hypertension, diastolic dysfunction, and food allergy directed against shellfish and fish.  I last saw in this clinic 19 June 2022.  She has really done well with her asthma while using a combination of Xolair, long-acting bronchodilator, inhaled steroid, and anticholinergic agent and she had a rare requirement for short acting bronchodilator and did not require systemic steroid or an antibiotic for any type of airway issue.  Unfortunately, about 3 weeks ago she has noticed a little bit more problems with wheezing and she has had to use her short acting bronchodilator few times a week.  She has had very little problems with her upper airways.  Her reflux is under very good control.  She remains away from consumption of fish and shellfish.  She recently had acute onset of left foot swelling and pain for which she recently went to see her primary care doctor who started her on prednisone yesterday for possible gout.  Allergies as of 11/27/2022       Reactions   Dilantin [phenytoin] Swelling   facial swelling   Latex Hives   Oxycodone Nausea And Vomiting, Nausea Only   Abdominal Pain, Vomiting   Penicillins Nausea And Vomiting, Swelling   Has patient had a PCN reaction causing immediate rash, facial/tongue/throat swelling, SOB or lightheadedness with hypotension patient had a PCN reaction causing severe rash involving mucus membranes or skin necrosis: [id number] Has patient had a PCN reaction that required hospitalization/No Has patient had  a PCN reaction occurring within the last 10 years: No If all of the above answers are "NO", then may proceed with Cephalosporin use.   Codeine Nausea Only   Tolerates tylenol with codeine   Hydrocodone Nausea And Vomiting   Nickel Hives, Rash   Pregabalin Itching, Other (See Comments)   headaches/problem w/vision        Medication List    ABSORBINE PLUS JR EX Apply 1 patch topically daily as needed (pain).   Advair HFA 230-21 MCG/ACT inhaler Generic drug: fluticasone-salmeterol 2 puffs 1-2 times daily depending on disease activity.   albuterol 108 (90 Base) MCG/ACT inhaler Commonly known as: VENTOLIN HFA INHALE 2 PUFFS BY MOUTH EVERY 6 HOURS AS NEEDED FOR WHEEZING OR SHORTNESS OF BREATH   albuterol (2.5 MG/3ML) 0.083% nebulizer solution Commonly known as: PROVENTIL Take 3 mLs (2.5 mg total) by nebulization every 6 (six) hours as needed for wheezing or shortness of breath.   anastrozole 1 MG tablet Commonly known as: ARIMIDEX TAKE 1 TABLET(1 MG) BY MOUTH DAILY   atorvastatin 10 MG tablet Commonly known as: LIPITOR TAKE 1 TABLET(10 MG) BY MOUTH DAILY   BIOTENE DRY MOUTH MT Use as directed 1 Dose in the mouth or throat daily as needed (dry mouth).   CALCIUM PO Take 1,000 mg by mouth daily.   CoQ10 100 MG Caps Take 100 mg by mouth daily.   cyclobenzaprine 10 MG tablet Commonly known as: FLEXERIL Take 0.5-1 tablets (5-10 mg total) by mouth at bedtime as needed for muscle spasms.   DAIRY-RELIEF PO Take 1 capsule by mouth daily  as needed (eating dairy).   desloratadine 5 MG tablet Commonly known as: Clarinex Take 1 tablet (5 mg total) by mouth daily.   EPINEPHrine 0.3 mg/0.3 mL Soaj injection Commonly known as: EPI-PEN USE AS DIRECTED FOR LIFE THREATENING ALLERGIC REACTIONS   estradiol 0.1 MG/GM vaginal cream Commonly known as: ESTRACE Place 1 Applicatorful vaginally 2 (two) times a week. (As Needed)   famotidine 40 MG tablet Commonly known as: PEPCID Take  1 (ONE) tablet by mouth every evening.   fluconazole 150 MG tablet Commonly known as: DIFLUCAN Take 1 tab po today, repeat in 7 days if needed   fluticasone 220 MCG/ACT inhaler Commonly known as: FLOVENT HFA Inhale 2 puffs into the lungs 2 (two) times daily as needed (shortness of breath).   gabapentin 300 MG capsule Commonly known as: NEURONTIN Take one capsule in the morning, one at lunch and two at bedtime   Glyxambi 10-5 MG Tabs Generic drug: Empagliflozin-linaGLIPtin TAKE 1 TABLET BY MOUTH DAILY   hydrOXYzine 10 MG tablet Commonly known as: ATARAX Take 1 tablet (10 mg total) by mouth daily as needed for itching.   ipratropium 0.06 % nasal spray Commonly known as: ATROVENT 1-2 sprays in each nostril 1-2 times per day as needed to dry nose   irbesartan 150 MG tablet Commonly known as: AVAPRO TAKE 1 TABLET(150 MG) BY MOUTH DAILY   levalbuterol 1.25 MG/3ML nebulizer solution Commonly known as: XOPENEX USE 1 VIAL VIA NEBULIZER EVERY 6 HOURS AS NEEDED FOR SHORTNESS OF BREATH OR WHEEZING   Magnesium 500 MG Tabs Take 500 mg by mouth daily.   METAMUCIL FIBER PO Take 1 Dose by mouth daily. 1 dose = 1/2 tablespoon   methimazole 5 MG tablet Commonly known as: TAPAZOLE Take 1 tablet (5 mg total) by mouth every other day.   montelukast 10 MG tablet Commonly known as: SINGULAIR Take 1 tablet (10 mg total) by mouth at bedtime. For asthma control.   nystatin 100000 UNIT/ML suspension Commonly known as: MYCOSTATIN 5mL swish and swallow after Advair 2 times daily.   ondansetron 8 MG disintegrating tablet Commonly known as: ZOFRAN-ODT Take 1 tablet (8 mg total) by mouth every 8 (eight) hours as needed for nausea or vomiting.   OneTouch Delica Plus Lancet33G Misc USE TO CHECK BLOOD SUGAR UP TO TWICE DAILY   OneTouch Verio test strip Generic drug: glucose blood 1 strip 2 (two) times daily   OneTouch Verio w/Device Kit Use to check blood sugar up to 2 times a day    oxybutynin 10 MG 24 hr tablet Commonly known as: DITROPAN-XL Take 10 mg by mouth at bedtime.   pantoprazole 40 MG tablet Commonly known as: PROTONIX Take 1 (ONE) tablet by mouth every morning.   polyethylene glycol powder 17 GM/SCOOP powder Commonly known as: GLYCOLAX/MIRALAX Take 1 Container by mouth once.   predniSONE 20 MG tablet Commonly known as: DELTASONE 2 tabs po daily for 5 days, then 1 tab po daily for 5 days   PRESCRIPTION MEDICATION Inhale into the lungs at bedtime. CPAP   SALONPAS JET SPRAY EX Apply 1 spray topically daily as needed (knee pain).   Spiriva Respimat 1.25 MCG/ACT Aers Generic drug: Tiotropium Bromide Monohydrate Inhale 2 each into the lungs daily.   SYSTANE OP Place 1 drop into both eyes daily as needed (dry eyes).   tolterodine 4 MG 24 hr capsule Commonly known as: DETROL LA Take 4 mg by mouth at bedtime.   Trulance 3 MG Tabs Generic drug: Plecanatide  Take 3 mg by mouth daily as needed (constipation).   Xarelto 20 MG Tabs tablet Generic drug: rivaroxaban TAKE 1 TABLET(20 MG) BY MOUTH DAILY WITH SUPPER   Zinc 25 MG Tabs Take 25 mg by mouth daily.    Past Medical History:  Diagnosis Date   Allergic rhinitis    Allergy    Arthritis    Asthma    Breast cancer (HCC)    Chronic headache    Diabetes mellitus    Ductal carcinoma in situ (DCIS) of left breast 02/23/2022   DVT (deep venous thrombosis) (HCC)    Dyspnea    Heart murmur    History of radiation therapy    Left Breast 05/02/22-05/30/22- Dr. Antony Blackbird   Hypertension    Penicillin allergy 01/19/2020   Pulmonary embolism (HCC)    Pulmonary hypertension (HCC)    Seizures (HCC)     Past Surgical History:  Procedure Laterality Date   ABDOMINAL HYSTERECTOMY     partial, has ovaries   BREAST LUMPECTOMY WITH RADIOACTIVE SEED LOCALIZATION Left 03/29/2022   Procedure: LEFT BREAST LUMPECTOMY WITH RADIOACTIVE SEED LOCALIZATION;  Surgeon: Harriette Bouillon, MD;  Location: [facility]  OR;  Service: General;  Laterality: Left;   CARDIAC CATHETERIZATION  12/28/2010   Mod. pulmonary hypertension, normal coronary arteries   CARDIOVERSION N/A 11/23/2020   Procedure: CARDIOVERSION;  Surgeon: Thurmon Fair, MD;  Location: [facility] ENDOSCOPY;  Service: Cardiovascular;  Laterality: N/A;   CHOLECYSTECTOMY N/A 12/20/2021   Procedure: LAPAROSCOPIC CHOLECYSTECTOMY WITH INTRAOPERATIVE CHOLANGIOGRAM;  Surgeon: Darnell Level, MD;  Location: [facility] ORS;  Service: General;  Laterality: N/A;   DOPPLER ECHOCARDIOGRAPHY  10/08/2011   EF=>55%,mild asymmetric LVH, mod. TR, mod. PH, mild to mod LA dilatation   IR LUMBAR DISC ASPIRATION W/IMG GUIDE  01/12/2020   KNEE ARTHROSCOPY Left    KNEE SURGERY     Nuclear Stress Test  05/20/2006   No ischemia   PARTIAL HYSTERECTOMY     PLANTAR FASCIA SURGERY     TONSILLECTOMY      Review of systems negative except as noted in HPI / PMHx or noted below:  Review of Systems  Constitutional: Negative.   HENT: Negative.    Eyes: Negative.   Respiratory: Negative.    Cardiovascular: Negative.   Gastrointestinal: Negative.   Genitourinary: Negative.   Musculoskeletal: Negative.   Skin: Negative.   Neurological: Negative.   Endo/Heme/Allergies: Negative.   Psychiatric/Behavioral: Negative.       Objective:   Vitals:   11/27/22 1004  BP: 138/82  Pulse: (!) 104  Resp: 18  Temp: 98.6 F (37 C)  SpO2: 95%   Height: 5\' 4"  (162.6 cm)  Weight: 200 lb (90.7 kg)   Physical Exam Constitutional:      Appearance: She is not diaphoretic.  HENT:     Head: Normocephalic.     Right Ear: Tympanic membrane, ear canal and external ear normal.     Left Ear: Tympanic membrane, ear canal and external ear normal.     Nose: Nose normal. No mucosal edema or rhinorrhea.     Mouth/Throat:     Pharynx: Uvula midline. No oropharyngeal exudate.  Eyes:     Conjunctiva/sclera: Conjunctivae normal.  Neck:     Thyroid: No thyromegaly.     Trachea: Trachea normal. No tracheal  tenderness or tracheal deviation.  Cardiovascular:     Rate and Rhythm: Normal rate and regular rhythm.     Heart sounds: S1 normal and S2 normal. Murmur (systolic) heard.  Pulmonary:     Effort: No respiratory distress.     Breath sounds: Normal breath sounds. No stridor. No wheezing or rales.  Lymphadenopathy:     Head:     Right side of head: No tonsillar adenopathy.     Left side of head: No tonsillar adenopathy.     Cervical: No cervical adenopathy.  Skin:    Findings: No erythema or rash.     Nails: There is no clubbing.  Neurological:     Mental Status: She is alert.     Diagnostics:    Spirometry was performed and demonstrated an FEV1 of 1.41 at 81 % of predicted.  Assessment and Plan:   1. Severe persistent asthma without complication   2. Other allergic rhinitis   3. Adverse food reaction, subsequent encounter   4. LPRD (laryngopharyngeal reflux disease)    1. Continue Advair 230 - 2 inhalations 2 times a day depending on disease activity  2. Continue Spiriva 2.5 respimat - 1 inhalations 1 time per day.  3. Continue Montelukast 10mg  - 1 tablet 1 time per day.  4. Continue Xolair injections  5. Continue pantoprazole 40mg  in the AM + Famotidine 40 mg in the PM.    6. If needed:   A. Nasal saline several times per day  B. Ipratropium 0.06% -1-2 sprays each nostril 1-2 times per day   C. OTC NASACORT - 1 spray each nostril 1 time per day   D. Proventil HFA or albuterol nebulization  E. Epi-pen  F. Mucinex DM    7. Add Flovent 220 - 2 inhalations 2 times a day during "FLARE UP"  8. Return to clinic in 6 months or earlier if there is a problem.  Allisha appears to be doing okay on her current plan and she has been stable now for a while while using a collection of anti-inflammatory agents for airway including use of anti-IgE antibody and her reflux is also been under very good control.  She has had a slight flareup of her asthma recently and I suspect that the  prednisone she started yesterday for her apparent gout affecting her left lower extremity will probably take care of that issue.  Assuming she does well with the plan noted above I will see her back in this clinic in 6 months or earlier if there is a problem.   Laurette Schimke, MD Allergy / Immunology [facility]

## 2022-11-27 NOTE — Telephone Encounter (Signed)
Patient is requesting a call to discuss the issues with her foot from her appointment on 11/26/2022,so that she can possibly referred out to a specialist for her foot.

## 2022-11-27 NOTE — Patient Instructions (Addendum)
  1. Continue Advair 230 - 2 inhalations 2 times a day depending on disease activity  2. Continue Spiriva 2.5 respimat - 1 inhalations 1 time per day.  3. Continue Montelukast 10mg  - 1 tablet 1 time per day.  4. Continue Xolair injections  5. Continue pantoprazole 40mg  in the AM + Famotidine 40 mg in the PM.    6. If needed:   A. Nasal saline several times per day  B. Ipratropium 0.06% -1-2 sprays each nostril 1-2 times per day   C. OTC NASACORT - 1 spray each nostril 1 time per day   D. Proventil HFA or albuterol nebulization  E. Epi-pen  F. Mucinex DM    7. Add Flovent 220 - 2 inhalations 2 times a day during "FLARE UP"  8. Return to clinic in 6 months or earlier if there is a problem.

## 2022-11-28 ENCOUNTER — Encounter: Payer: Self-pay | Admitting: Allergy and Immunology

## 2022-11-28 DIAGNOSIS — H903 Sensorineural hearing loss, bilateral: Secondary | ICD-10-CM | POA: Diagnosis not present

## 2022-11-29 ENCOUNTER — Telehealth: Payer: Self-pay | Admitting: *Deleted

## 2022-11-29 NOTE — Patient Instructions (Addendum)
Visit Information  Thank you for taking time to visit with me today. Please don't hesitate to contact me if I can be of assistance to you.   Following are the goals we discussed today:   Goals Addressed             This Visit's Progress    Managing anxiety       . Activities and task to complete in order to accomplish goals.  Continue with compliance of taking medication prescribed by Doctor Patient to complete initial appointment with [facility] on 12/05/22 ar 2pm Continue with self care strategies-exercise, participation in social activities, ongoing mental health follow up          Our next appointment is by telephone on 12/03/22 at 9am  Please call the care guide team at [phone] if you need to cancel or reschedule your appointment.   If you are experiencing a Mental Health or Behavioral Health Crisis or need someone to talk to, please call the Suicide and Crisis Lifeline: 988   Patient verbalizes understanding of instructions and care plan provided today and agrees to view in MyChart. Active MyChart status and patient understanding of how to access instructions and care plan via MyChart confirmed with patient.     Telephone follow up appointment with care management team member scheduled for: 12/12/22  Verna Czech, LCSW Clinical Social Worker  [facility] [phone]

## 2022-11-29 NOTE — Patient Outreach (Addendum)
  Care Coordination   Follow Up Visit Note   11/29/2022 Name: [name] MRN: [id number] DOB: 06/28/1946  [name] is a 77 y.o. year old female who sees Excell Seltzer, MD for primary care. I spoke with  Gwynneth Albright by phone today.  What matters to the patients health and wellness today?  Ongoing mental health follow up.    Goals Addressed             This Visit's Progress    Managing anxiety       . Activities and task to complete in order to accomplish goals.  Continue with compliance of taking medication prescribed by Doctor Patient to complete initial appointment with [facility] on 12/05/22 ar 2pm Continue with self care strategies-exercise, participation in social activities, ongoing mental health follow up          SDOH assessments and interventions completed:  No     Care Coordination Interventions:  Yes, provided  Interventions Today    Flowsheet Row Most Recent Value  Chronic Disease   Chronic disease during today's visit Other  [anxiety]  General Interventions   General Interventions Discussed/Reviewed [employer] reinforcement provided for involvement in social activities "Senior [employer]"]  Exercise Interventions   Exercise Discussed/Reviewed Exercise Discussed  Physical Activity Discussed/Reviewed Physical Activity Discussed  [positive reinforcement provided for physical activity and excercise]  Mental Health Interventions   Mental Health Discussed/Reviewed Mental Health Reviewed, Coping Strategies, Anxiety  [patient concerned that she presented for her appointment with  [facility] and was not seen-appointment rescheduled for 12/05/22 at 2pm-patient requesting additional options as back up]       Follow up plan: Follow up call scheduled for 12/12/22    Encounter Outcome:  Pt. Visit Completed

## 2022-12-01 ENCOUNTER — Other Ambulatory Visit: Payer: Self-pay | Admitting: Family Medicine

## 2022-12-03 ENCOUNTER — Ambulatory Visit (INDEPENDENT_AMBULATORY_CARE_PROVIDER_SITE_OTHER): Payer: Medicare Other | Admitting: Podiatry

## 2022-12-03 DIAGNOSIS — Z91199 Patient's noncompliance with other medical treatment and regimen due to unspecified reason: Secondary | ICD-10-CM

## 2022-12-04 ENCOUNTER — Telehealth: Payer: Self-pay | Admitting: *Deleted

## 2022-12-04 NOTE — Telephone Encounter (Signed)
Received call from pt with complaint of increase in excess gas and requesting advice from MD if symptoms could be related to Anastrozole.  Per MD, symptoms not common with Anastrozole and pt needing to f/u with PCP.  Pt educated and verbalized understanding.

## 2022-12-05 DIAGNOSIS — F411 Generalized anxiety disorder: Secondary | ICD-10-CM | POA: Diagnosis not present

## 2022-12-05 NOTE — Progress Notes (Signed)
1. No-show for appointment     

## 2022-12-06 ENCOUNTER — Telehealth: Payer: Self-pay | Admitting: *Deleted

## 2022-12-06 ENCOUNTER — Encounter: Payer: Self-pay | Admitting: Family Medicine

## 2022-12-06 ENCOUNTER — Ambulatory Visit (INDEPENDENT_AMBULATORY_CARE_PROVIDER_SITE_OTHER): Payer: Medicare Other | Admitting: Family Medicine

## 2022-12-06 VITALS — BP 130/76 | HR 56 | Temp 97.3°F | Ht 64.25 in | Wt 202.2 lb

## 2022-12-06 DIAGNOSIS — R143 Flatulence: Secondary | ICD-10-CM | POA: Diagnosis not present

## 2022-12-06 NOTE — Patient Instructions (Addendum)
Avoid gas forming foods.  Start probiotic ( like align or culturelle) daily.  Foods that cause too much gas  Foods that cause gas in one person might not cause it in another. Common foods and substances that produce gas include:  Beans and lentils Vegetables such as cabbage, broccoli, cauliflower, bok choy and Brussels sprouts Bran Dairy products containing lactose Fructose, which is found in some fruits and used as a sweetener in soft drinks and other products Sorbitol, a sugar substitute found in some sugar-free candies, gums and artificial sweeteners Carbonated beverages, such as soda or beer

## 2022-12-06 NOTE — Telephone Encounter (Signed)
Ms. Maltbie has a rheumatology referral in Epic.  She wants it sent to [facility] on [address]  Phone number [phone] .  She states she can get in there sooner.

## 2022-12-06 NOTE — Assessment & Plan Note (Signed)
Acute, most likely dietary versus bacterial overgrowth.  She is on several medications that could potentially contribute to gas but none of these are new.  I have encouraged her to start a probiotic and push water.  Also encouraged her to consider stopping chewing gum daily.  I have also given her a list of foods to either avoid or at least know whether she has gas after eating.

## 2022-12-06 NOTE — Progress Notes (Signed)
Patient ID: [name], female    DOB: 1946-07-13, 77 y.o.   MRN: [id number]  This visit was conducted in person.  BP 130/76 (BP Location: Left Arm, Patient Position: Sitting, Cuff Size: Large)   Pulse (!) 56   Temp (!) 97.3 F (36.3 C) (Temporal)   Ht 5' 4.25" (1.632 m)   Wt 202 lb 4 oz (91.7 kg)   SpO2 97%   BMI 34.45 kg/m    CC:  Chief Complaint  Patient presents with   Gas    Subjective:   HPI: [name] is a 77 y.o. female presenting on 12/06/2022 for Gas    She has been noting increase in passing gas ( flatulence), abdominal bloating, worse in last 2 weeks.   Having BMs irregularly but this is usually for her.  She is using Miralax daily. Trulance off and on is trouble increases with constipation.  No N/V/D.   No change in diet... but does eat more salads. Occ heart burn.  She is using pepcid daily.  No new med changes.Marland Kitchen except treated ankle with prednisone and fluconazole Oncology felt her increase in gas was not clearly related to  anastrozole.. has ben on since 06/2022   She is starting yoga class.  She is now seeing a therapist for recent anxiety.   Colonoscopy in 09/2022  Relevant past medical, surgical, family and social history reviewed and updated as indicated. Interim medical history since our last visit reviewed. Allergies and medications reviewed and updated. Outpatient Medications Prior to Visit  Medication Sig Dispense Refill   ADVAIR HFA 230-21 MCG/ACT inhaler 2 puffs 1-2 times daily depending on disease activity. 12 g 5   albuterol (PROVENTIL) (2.5 MG/3ML) 0.083% nebulizer solution Take 3 mLs (2.5 mg total) by nebulization every 6 (six) hours as needed for wheezing or shortness of breath. 75 mL 1   albuterol (VENTOLIN HFA) 108 (90 Base) MCG/ACT inhaler INHALE 2 PUFFS BY MOUTH EVERY 6 HOURS AS NEEDED FOR WHEEZING OR SHORTNESS OF BREATH 18 g 1   anastrozole (ARIMIDEX) 1 MG tablet TAKE 1 TABLET(1 MG) BY MOUTH DAILY 90 tablet 0    atorvastatin (LIPITOR) 10 MG tablet TAKE 1 TABLET(10 MG) BY MOUTH DAILY 90 tablet 3   Blood Glucose Monitoring Suppl (ONETOUCH VERIO) w/Device KIT Use to check blood sugar up to 2 times a day 1 kit 0   CALCIUM PO Take 1,000 mg by mouth daily.     Coenzyme Q10 (COQ10) 100 MG CAPS Take 100 mg by mouth daily.     cyclobenzaprine (FLEXERIL) 10 MG tablet Take 0.5-1 tablets (5-10 mg total) by mouth at bedtime as needed for muscle spasms. 20 tablet 0   desloratadine (CLARINEX) 5 MG tablet Take 1 tablet (5 mg total) by mouth daily. 90 tablet 1   EPINEPHrine 0.3 mg/0.3 mL IJ SOAJ injection USE AS DIRECTED FOR LIFE THREATENING ALLERGIC REACTIONS 2 each 1   estradiol (ESTRACE) 0.1 MG/GM vaginal cream Place 1 Applicatorful vaginally 2 (two) times a week. (As Needed)     famotidine (PEPCID) 40 MG tablet Take 1 (ONE) tablet by mouth every evening. 90 tablet 1   fluconazole (DIFLUCAN) 150 MG tablet Take 1 tab po today, repeat in 7 days if needed 2 tablet 0   fluticasone (FLOVENT HFA) 220 MCG/ACT inhaler Inhale 2 puffs into the lungs 2 (two) times daily as needed (shortness of breath).     fluticasone (FLOVENT HFA) 220 MCG/ACT inhaler 2 puffs 2 times daily  during flare up. 12 g 5   gabapentin (NEURONTIN) 300 MG capsule Take one capsule in the morning, one at lunch and two at bedtime 360 capsule 3   glucose blood (ONETOUCH VERIO) test strip 1 strip 2 (two) times daily     GLYXAMBI 10-5 MG TABS TAKE 1 TABLET BY MOUTH DAILY 30 tablet 5   hydrOXYzine (ATARAX/VISTARIL) 10 MG tablet Take 1 tablet (10 mg total) by mouth daily as needed for itching. 30 tablet 2   ipratropium (ATROVENT) 0.06 % nasal spray 1-2 sprays in each nostril 1-2 times per day as needed to dry nose 15 mL 5   irbesartan (AVAPRO) 150 MG tablet TAKE 1 TABLET(150 MG) BY MOUTH DAILY 90 tablet 2   Lactase (DAIRY-RELIEF PO) Take 1 capsule by mouth daily as needed (eating dairy).      Lancets (ONETOUCH DELICA PLUS LANCET33G) MISC USE TO CHECK BLOOD SUGAR UP  TO TWICE DAILY 100 each 5   levalbuterol (XOPENEX) 1.25 MG/3ML nebulizer solution USE 1 VIAL VIA NEBULIZER EVERY 6 HOURS AS NEEDED FOR SHORTNESS OF BREATH OR WHEEZING 90 mL 3   Magnesium 500 MG TABS Take 500 mg by mouth daily.     Menthol, Topical Analgesic, (ABSORBINE PLUS JR EX) Apply 1 patch topically daily as needed (pain).     Menthol-Methyl Salicylate (SALONPAS JET SPRAY EX) Apply 1 spray topically daily as needed (knee pain).     METAMUCIL FIBER PO Take 1 Dose by mouth daily. 1 dose = 1/2 tablespoon     methimazole (TAPAZOLE) 5 MG tablet Take 1 tablet (5 mg total) by mouth every other day. 45 tablet 3   montelukast (SINGULAIR) 10 MG tablet Take 1 tablet (10 mg total) by mouth at bedtime. For asthma control. 90 tablet 1   Mouthwashes (BIOTENE DRY MOUTH MT) Use as directed 1 Dose in the mouth or throat daily as needed (dry mouth).     nystatin (MYCOSTATIN) 100000 UNIT/ML suspension 5mL swish and swallow after Advair 2 times daily. 473 mL 5   ondansetron (ZOFRAN-ODT) 8 MG disintegrating tablet Take 1 tablet (8 mg total) by mouth every 8 (eight) hours as needed for nausea or vomiting. 20 tablet 0   oxybutynin (DITROPAN-XL) 10 MG 24 hr tablet Take 10 mg by mouth at bedtime.     pantoprazole (PROTONIX) 40 MG tablet Take 1 (ONE) tablet by mouth every morning. 90 tablet 1   Plecanatide (TRULANCE) 3 MG TABS Take 3 mg by mouth daily as needed (constipation).     Polyethyl Glycol-Propyl Glycol (SYSTANE OP) Place 1 drop into both eyes daily as needed (dry eyes).     polyethylene glycol powder (GLYCOLAX/MIRALAX) 17 GM/SCOOP powder Take 1 Container by mouth once.     predniSONE (DELTASONE) 20 MG tablet 2 tabs po daily for 5 days, then 1 tab po daily for 5 days 15 tablet 0   PRESCRIPTION MEDICATION Inhale into the lungs at bedtime. CPAP     Tiotropium Bromide Monohydrate (SPIRIVA RESPIMAT) 1.25 MCG/ACT AERS Inhale 1 Inhalation into the lungs daily. 4 g 5   tolterodine (DETROL LA) 4 MG 24 hr capsule Take 4  mg by mouth at bedtime.     XARELTO 20 MG TABS tablet TAKE 1 TABLET(20 MG) BY MOUTH DAILY WITH SUPPER 90 tablet 1   Zinc 25 MG TABS Take 25 mg by mouth daily.     Facility-Administered Medications Prior to Visit  Medication Dose Route Frequency Provider Last Rate Last Admin   omalizumab Geoffry Paradise)  injection 300 mg  300 mg Subcutaneous Q28 days Jessica Priest, MD   300 mg at 11/27/22 1610     Per HPI unless specifically indicated in ROS section below Review of Systems  Constitutional:  Negative for fatigue and fever.  HENT:  Negative for congestion.   Eyes:  Negative for pain.  Respiratory:  Negative for cough and shortness of breath.   Cardiovascular:  Negative for chest pain, palpitations and leg swelling.  Gastrointestinal:  Negative for abdominal pain.  Genitourinary:  Negative for dysuria and vaginal bleeding.  Musculoskeletal:  Negative for back pain.  Neurological:  Negative for syncope, light-headedness and headaches.  Psychiatric/Behavioral:  Negative for dysphoric mood.    Objective:  BP 130/76 (BP Location: Left Arm, Patient Position: Sitting, Cuff Size: Large)   Pulse (!) 56   Temp (!) 97.3 F (36.3 C) (Temporal)   Ht 5' 4.25" (1.632 m)   Wt 202 lb 4 oz (91.7 kg)   SpO2 97%   BMI 34.45 kg/m   Wt Readings from Last 3 Encounters:  12/06/22 202 lb 4 oz (91.7 kg)  11/27/22 200 lb (90.7 kg)  11/26/22 202 lb (91.6 kg)      Physical Exam Constitutional:      General: She is not in acute distress.    Appearance: Normal appearance. She is well-developed. She is not ill-appearing or toxic-appearing.  HENT:     Head: Normocephalic.     Right Ear: Hearing, tympanic membrane, ear canal and external ear normal. Tympanic membrane is not erythematous, retracted or bulging.     Left Ear: Hearing, tympanic membrane, ear canal and external ear normal. Tympanic membrane is not erythematous, retracted or bulging.     Nose: No mucosal edema or rhinorrhea.     Right Sinus: No  maxillary sinus tenderness or frontal sinus tenderness.     Left Sinus: No maxillary sinus tenderness or frontal sinus tenderness.     Mouth/Throat:     Pharynx: Uvula midline.  Eyes:     General: Lids are normal. Lids are everted, no foreign bodies appreciated.     Conjunctiva/sclera: Conjunctivae normal.     Pupils: Pupils are equal, round, and reactive to light.  Neck:     Thyroid: No thyroid mass or thyromegaly.     Vascular: No carotid bruit.     Trachea: Trachea normal.  Cardiovascular:     Rate and Rhythm: Normal rate and regular rhythm.     Pulses: Normal pulses.     Heart sounds: Normal heart sounds, S1 normal and S2 normal. No murmur heard.    No friction rub. No gallop.  Pulmonary:     Effort: Pulmonary effort is normal. No tachypnea or respiratory distress.     Breath sounds: Normal breath sounds. No decreased breath sounds, wheezing, rhonchi or rales.  Abdominal:     General: Bowel sounds are normal.     Palpations: Abdomen is soft.     Tenderness: There is no abdominal tenderness.  Musculoskeletal:     Cervical back: Normal range of motion and neck supple.  Skin:    General: Skin is warm and dry.     Findings: No rash.  Neurological:     Mental Status: She is alert.  Psychiatric:        Mood and Affect: Mood is not anxious or depressed.        Speech: Speech normal.        Behavior: Behavior normal. Behavior is cooperative.  Thought Content: Thought content normal.        Judgment: Judgment normal.       Results for orders placed or performed in visit on 11/21/22  HM COLONOSCOPY  Result Value Ref Range   HM Colonoscopy See Report (in chart) See Report (in chart), Patient Reported   *Note: Due to a large number of results and/or encounters for the requested time period, some results have not been displayed. A complete set of results can be found in Results Review.    Assessment and Plan  Excessive flatus Assessment & Plan: Acute, most likely  dietary versus bacterial overgrowth.  She is on several medications that could potentially contribute to gas but none of these are new.  I have encouraged her to start a probiotic and push water.  Also encouraged her to consider stopping chewing gum daily.  I have also given her a list of foods to either avoid or at least know whether she has gas after eating.     No follow-ups on file.   Kerby Nora, MD

## 2022-12-11 ENCOUNTER — Other Ambulatory Visit: Payer: Self-pay | Admitting: Hematology and Oncology

## 2022-12-12 ENCOUNTER — Encounter: Payer: Self-pay | Admitting: Family Medicine

## 2022-12-12 ENCOUNTER — Ambulatory Visit: Payer: Self-pay | Admitting: *Deleted

## 2022-12-12 DIAGNOSIS — F411 Generalized anxiety disorder: Secondary | ICD-10-CM | POA: Diagnosis not present

## 2022-12-12 NOTE — Patient Outreach (Signed)
  Care Coordination   Follow Up Visit Note   12/12/2022 Name: NGOC PARMETER MRN: [id number] DOB: July 05, 1946  LELLA SARIC is a 77 y.o. year old female who sees Excell Seltzer, MD for primary care. I spoke with  Gwynneth Albright by phone today.  What matters to the patients health and wellness today?  Mental health support. Patient sates that she has worked out communication concerns with the mental health agency she was referred to and plans to continue receiving services there. Patient also confirms being active in her church community and attends yoga classes 2x per week. Patient verbalized having no additional community resource needs at this time.   Goals Addressed             This Visit's Progress    management of feelings of anxiety       Interventions Today    Flowsheet Row Most Recent Value  Chronic Disease   Chronic disease during today's visit Other  [anxiety]  General Interventions   General Interventions Discussed/Reviewed General Interventions Reviewed, Community Resources  Exercise Interventions   Exercise Discussed/Reviewed Exercise Discussed  [patient participates in yoga 2x per week]  Mental Health Interventions   Mental Health Discussed/Reviewed Mental Health Reviewed, Coping Strategies, Anxiety  [patient now actively working with [facility]-next appointment scheduled for today-per patient sessions are beneficial patient also active in bible study]              SDOH assessments and interventions completed:  No     Care Coordination Interventions:  Yes, provided   Follow up plan: No further intervention required.   Encounter Outcome:  Pt. Visit Completed

## 2022-12-12 NOTE — Telephone Encounter (Signed)
error 

## 2022-12-12 NOTE — Patient Instructions (Signed)
Visit Information  Thank you for taking time to visit with me today. Please don't hesitate to contact me if I can be of assistance to you.   Following are the goals we discussed today:   Goals Addressed             This Visit's Progress    management of feelings of anxiety       Interventions Today    Flowsheet Row Most Recent Value  Chronic Disease   Chronic disease during today's visit Other  [anxiety]  General Interventions   General Interventions Discussed/Reviewed General Interventions Reviewed, Community Resources  Exercise Interventions   Exercise Discussed/Reviewed Exercise Discussed  [postive reinforcement provided for participation in yoga 2x per week]  Mental Health Interventions   Mental Health Discussed/Reviewed Mental Health Reviewed, Coping Strategies, Anxiety  [patient now actively working with [facility]-next appointment scheduled for today-per patient sessions are beneficial patient also active in bible study-active participation reinforced]                If you are experiencing a Mental Health or Behavioral Health Crisis or need someone to talk to, please call the Suicide and Crisis Lifeline: 988   Patient verbalizes understanding of instructions and care plan provided today and agrees to view in MyChart. Active MyChart status and patient understanding of how to access instructions and care plan via MyChart confirmed with patient.     No further follow up required: patient to follow up with mental health therapist at [facility]  [facility], [place] Clinical Social Worker  [facility] [phone]    \

## 2022-12-18 ENCOUNTER — Ambulatory Visit: Payer: Medicare Other | Admitting: Allergy and Immunology

## 2022-12-19 DIAGNOSIS — F411 Generalized anxiety disorder: Secondary | ICD-10-CM | POA: Diagnosis not present

## 2022-12-25 ENCOUNTER — Ambulatory Visit (INDEPENDENT_AMBULATORY_CARE_PROVIDER_SITE_OTHER): Payer: Medicare Other

## 2022-12-25 ENCOUNTER — Ambulatory Visit: Payer: Medicare Other | Admitting: Allergy and Immunology

## 2022-12-25 DIAGNOSIS — J4550 Severe persistent asthma, uncomplicated: Secondary | ICD-10-CM | POA: Diagnosis not present

## 2022-12-27 ENCOUNTER — Telehealth: Payer: Self-pay | Admitting: Family Medicine

## 2022-12-27 DIAGNOSIS — N3941 Urge incontinence: Secondary | ICD-10-CM

## 2022-12-27 NOTE — Telephone Encounter (Signed)
Lvmtcb

## 2022-12-27 NOTE — Telephone Encounter (Signed)
When need to know why she is requesting referral to urologist and preference of location:  [place] or [place].

## 2022-12-27 NOTE — Telephone Encounter (Signed)
Pt came by office to request a referral to a urologist? Call back # [phone]

## 2022-12-28 ENCOUNTER — Encounter: Payer: Self-pay | Admitting: Podiatry

## 2022-12-28 ENCOUNTER — Ambulatory Visit (INDEPENDENT_AMBULATORY_CARE_PROVIDER_SITE_OTHER): Payer: Medicare Other | Admitting: Podiatry

## 2022-12-28 VITALS — BP 146/79

## 2022-12-28 DIAGNOSIS — M79676 Pain in unspecified toe(s): Secondary | ICD-10-CM

## 2022-12-28 DIAGNOSIS — Q828 Other specified congenital malformations of skin: Secondary | ICD-10-CM | POA: Diagnosis not present

## 2022-12-28 DIAGNOSIS — L84 Corns and callosities: Secondary | ICD-10-CM

## 2022-12-28 DIAGNOSIS — B351 Tinea unguium: Secondary | ICD-10-CM | POA: Diagnosis not present

## 2022-12-28 DIAGNOSIS — E1142 Type 2 diabetes mellitus with diabetic polyneuropathy: Secondary | ICD-10-CM | POA: Diagnosis not present

## 2022-12-28 NOTE — Telephone Encounter (Signed)
I think she probably does need an official referral.  I will put in the referral.

## 2022-12-28 NOTE — Addendum Note (Signed)
Addended by: Kerby Nora E on: 12/28/2022 09:29 AM   Modules accepted: Orders

## 2022-12-28 NOTE — Telephone Encounter (Signed)
Urologist, been seeing one, but not pleased with one and would prefer another Urologist (last year in July)  Doesn't matter [place] or [place]  Lack of bladder control, medication to take, but went for a yearly check-up, had a mask, stood on other side of the room, how I am feeling today? Schedule next yearly appointment, felt like he didn't want to be bothered  Requesting a recommendation (pt did not think she needed an official referral)

## 2022-12-31 NOTE — Progress Notes (Signed)
Subjective:  Patient ID: [name], female    DOB: [birth year],  MRN: [id number]  [name] presents to clinic today for at risk foot care with history of diabetic neuropathy and callus(es) left foot, porokeratotic lesion(s) b/l lower extremities, and painful mycotic nails. Painful toenails interfere with ambulation. Aggravating factors include wearing enclosed shoe gear. Pain is relieved with periodic professional debridement. Painful callus(es) and porokeratotic lesion(s) are aggravated when weightbearing with and without shoegear. Pain is relieved with periodic professional debridement. Chief Complaint  Patient presents with   Nail Problem    DFC,Referring Provider Ermalene Searing, Amy E, MD,LOV:05/24,A1C:5.6,BS:not taken today      New problem(s): None.   PCP is Excell Seltzer, MD.  Allergies  Allergen Reactions   Dilantin [Phenytoin] Swelling    facial swelling   Latex Hives   Oxycodone Nausea And Vomiting and Nausea Only    Abdominal Pain, Vomiting   Penicillins Nausea And Vomiting and Swelling    Has patient had a PCN reaction causing immediate rash, facial/tongue/throat swelling, SOB or lightheadedness with hypotension patient had a PCN reaction causing severe rash involving mucus membranes or skin necrosis: [id number]} Has patient had a PCN reaction that required hospitalization/No Has patient had a PCN reaction occurring within the last 10 years: No If all of the above answers are "NO", then may proceed with Cephalosporin use.      Codeine Nausea Only    Tolerates tylenol with codeine   Hydrocodone Nausea And Vomiting   Nickel Hives and Rash   Pregabalin Itching and Other (See Comments)    headaches/problem w/vision    Review of Systems: Negative except as noted in the HPI. Objective:   Vitals:   12/28/22 0959  BP: (!) 146/79   Constitutional MEILYNN Price is a pleasant 77 y.o. female, WD, WN in NAD. AAO x 3.   Vascular Vascular Examination: Capillary  refill time immediate b/l. Vascular status intact b/l with palpable pedal pulses. Pedal hair sparse. No edema. No pain with calf compression b/l. Skin temperature gradient WNL b/l. No cyanosis or clubbing b/l. No ischemia or gangrene noted b/l LE.  Neurological Examination: Sensation grossly intact b/l with 10 gram monofilament. Vibratory sensation intact b/l. Pt has subjective symptoms of neuropathy.  Dermatological Examination: Pedal skin with normal turgor, texture and tone b/l.  No open wounds. No interdigital macerations.   Toenails 2-5 b/l thick, discolored, elongated with subungual debris and pain on dorsal palpation.   Anonychia noted bilateral great toes. Nailbed(s) epithelialized.  Hyperkeratotic lesion(s) distal tip of left great toe.  No erythema, no edema, no drainage, no fluctuance. Porokeratotic lesion(s) submet head 5 b/l and sub 5th met base right lower extremity. No erythema, no edema, no drainage, no fluctuance.  Musculoskeletal Examination: Muscle strength 5/5 to all lower extremity muscle groups bilaterally. Plantarflexed metatarsal(s) 5th metatarsal head b/l lower extremities.  Radiographs: None  Last A1c:      Latest Ref Rng & Units 03/22/2022   12:00 PM  Hemoglobin A1C  Hemoglobin-A1c 4.8 - 5.6 % 5.6        Assessment:   1. Pain due to onychomycosis of toenail   2. Porokeratosis   3. Callus   4. Diabetic polyneuropathy associated with type 2 diabetes mellitus (HCC)    Plan:  Patient was evaluated and treated and all questions answered. Consent given for treatment as described below: -Patient was evaluated and treated. All patient's and/or POA's questions/concerns answered on today's visit. -Examined patient. -Continue foot and  shoe inspections daily. Monitor blood glucose per PCP/Endocrinologist's recommendations. -Patient to continue soft, supportive shoe gear daily. -Toenails 1-5 b/l were debrided in length and girth with sterile nail nippers and dremel  without iatrogenic bleeding.  -Callus(es) left great toe pared utilizing sterile scalpel blade without complication or incident. Total number debrided =1. -Porokeratotic lesion(s) submet head 5 b/l and sub 5th met base right lower extremity pared and enucleated with sterile currette without incident. Total number of lesions debrided=3. -Patient/POA to call should there be question/concern in the interim.  Return in about 3 months (around 03/30/2023).  [name], DPM

## 2023-01-01 DIAGNOSIS — R591 Generalized enlarged lymph nodes: Secondary | ICD-10-CM | POA: Diagnosis not present

## 2023-01-01 DIAGNOSIS — M79672 Pain in left foot: Secondary | ICD-10-CM | POA: Diagnosis not present

## 2023-01-01 DIAGNOSIS — M2560 Stiffness of unspecified joint, not elsewhere classified: Secondary | ICD-10-CM | POA: Diagnosis not present

## 2023-01-01 DIAGNOSIS — M79642 Pain in left hand: Secondary | ICD-10-CM | POA: Diagnosis not present

## 2023-01-01 DIAGNOSIS — M79641 Pain in right hand: Secondary | ICD-10-CM | POA: Diagnosis not present

## 2023-01-01 DIAGNOSIS — Z6834 Body mass index (BMI) 34.0-34.9, adult: Secondary | ICD-10-CM | POA: Diagnosis not present

## 2023-01-01 DIAGNOSIS — E669 Obesity, unspecified: Secondary | ICD-10-CM | POA: Diagnosis not present

## 2023-01-01 DIAGNOSIS — M2540 Effusion, unspecified joint: Secondary | ICD-10-CM | POA: Diagnosis not present

## 2023-01-01 DIAGNOSIS — D8989 Other specified disorders involving the immune mechanism, not elsewhere classified: Secondary | ICD-10-CM | POA: Diagnosis not present

## 2023-01-03 ENCOUNTER — Telehealth: Payer: Self-pay

## 2023-01-03 DIAGNOSIS — H40013 Open angle with borderline findings, low risk, bilateral: Secondary | ICD-10-CM | POA: Diagnosis not present

## 2023-01-03 DIAGNOSIS — H524 Presbyopia: Secondary | ICD-10-CM | POA: Diagnosis not present

## 2023-01-03 NOTE — Telephone Encounter (Signed)
Pt called to report she went to see rheumatology, upon assessment her rheumatologist noted wheezing on physical exam and recommended pt call us to let us know. This nurse asked pt if she is currently using montelukast as prescribed as well as inhaler/nebulizer. She states she is and has an appt with her allergist 7/2. Advised pt to keep appt and if she becomes [facility] or has pain on inspiration to go to ED. She verbalized thanks and understanding.

## 2023-01-15 ENCOUNTER — Telehealth: Payer: Self-pay

## 2023-01-15 ENCOUNTER — Ambulatory Visit (INDEPENDENT_AMBULATORY_CARE_PROVIDER_SITE_OTHER): Payer: Medicare Other | Admitting: Allergy and Immunology

## 2023-01-15 VITALS — BP 146/86 | HR 100 | Temp 98.4°F | Resp 20 | Ht 64.25 in | Wt 205.1 lb

## 2023-01-15 DIAGNOSIS — M79642 Pain in left hand: Secondary | ICD-10-CM | POA: Diagnosis not present

## 2023-01-15 DIAGNOSIS — J4550 Severe persistent asthma, uncomplicated: Secondary | ICD-10-CM

## 2023-01-15 DIAGNOSIS — J3089 Other allergic rhinitis: Secondary | ICD-10-CM | POA: Diagnosis not present

## 2023-01-15 DIAGNOSIS — M2560 Stiffness of unspecified joint, not elsewhere classified: Secondary | ICD-10-CM | POA: Diagnosis not present

## 2023-01-15 DIAGNOSIS — K219 Gastro-esophageal reflux disease without esophagitis: Secondary | ICD-10-CM

## 2023-01-15 DIAGNOSIS — M2540 Effusion, unspecified joint: Secondary | ICD-10-CM | POA: Diagnosis not present

## 2023-01-15 DIAGNOSIS — R682 Dry mouth, unspecified: Secondary | ICD-10-CM | POA: Diagnosis not present

## 2023-01-15 DIAGNOSIS — E669 Obesity, unspecified: Secondary | ICD-10-CM | POA: Diagnosis not present

## 2023-01-15 DIAGNOSIS — M79641 Pain in right hand: Secondary | ICD-10-CM | POA: Diagnosis not present

## 2023-01-15 DIAGNOSIS — Z6833 Body mass index (BMI) 33.0-33.9, adult: Secondary | ICD-10-CM | POA: Diagnosis not present

## 2023-01-15 DIAGNOSIS — M79672 Pain in left foot: Secondary | ICD-10-CM | POA: Diagnosis not present

## 2023-01-15 DIAGNOSIS — T781XXD Other adverse food reactions, not elsewhere classified, subsequent encounter: Secondary | ICD-10-CM

## 2023-01-15 DIAGNOSIS — R591 Generalized enlarged lymph nodes: Secondary | ICD-10-CM | POA: Diagnosis not present

## 2023-01-15 MED ORDER — EPINEPHRINE 0.3 MG/0.3ML IJ SOAJ: INTRAMUSCULAR | 1 refills | Status: DC

## 2023-01-15 MED ORDER — TEZEPELUMAB-EKKO 210 MG/1.91ML ~~LOC~~ SOSY
210.0000 mg | PREFILLED_SYRINGE | SUBCUTANEOUS | Status: AC
  Administered 2023-01-15 – 2024-08-04 (×21): 210 mg via SUBCUTANEOUS

## 2023-01-15 MED ORDER — ALBUTEROL SULFATE HFA 108 (90 BASE) MCG/ACT IN AERS: INHALATION_SPRAY | RESPIRATORY_TRACT | 1 refills | Status: DC

## 2023-01-15 MED ORDER — IPRATROPIUM BROMIDE 0.06 % NA SOLN: 2.0000 | Freq: Two times a day (BID) | NASAL | 1 refills | Status: DC | PRN

## 2023-01-15 MED ORDER — MONTELUKAST SODIUM 10 MG PO TABS: 10.0000 mg | ORAL_TABLET | Freq: Every day | ORAL | 1 refills | Status: DC

## 2023-01-15 MED ORDER — TRIAMCINOLONE ACETONIDE 55 MCG/ACT NA AERO: 2.0000 | INHALATION_SPRAY | Freq: Every day | NASAL | 1 refills | Status: DC

## 2023-01-15 MED ORDER — ALBUTEROL SULFATE (2.5 MG/3ML) 0.083% IN NEBU: 2.5000 mg | INHALATION_SOLUTION | Freq: Four times a day (QID) | RESPIRATORY_TRACT | 1 refills | Status: DC | PRN

## 2023-01-15 MED ORDER — FLUTICASONE PROPIONATE HFA 220 MCG/ACT IN AERO: 2.0000 | INHALATION_SPRAY | Freq: Two times a day (BID) | RESPIRATORY_TRACT | 1 refills | Status: DC

## 2023-01-15 MED ORDER — NEBULIZER MASK ADULT MISC: 1.0000 | 1 refills | Status: DC

## 2023-01-15 MED ORDER — ADVAIR HFA 230-21 MCG/ACT IN AERO: 2.0000 | INHALATION_SPRAY | Freq: Two times a day (BID) | RESPIRATORY_TRACT | 1 refills | Status: DC

## 2023-01-15 MED ORDER — FAMOTIDINE 40 MG PO TABS: ORAL_TABLET | ORAL | 1 refills | Status: DC

## 2023-01-15 NOTE — Telephone Encounter (Signed)
Pt called stating she had labs drawn 2 weeks ago by Dr. Ambrose Finland, her rheumatologist. Dr. Drenda Freeze reportedly wanted Dr. Pamelia Hoit to review that lab work. Rheumatology note under media tab, will give to MD to review.

## 2023-01-15 NOTE — Progress Notes (Unsigned)
Immunotherapy   Patient Details  Name: SHANTELL REMALY MRN: [id number] Date of Birth: [birth year]  01/15/2023  Gwynneth Albright started injections for  Tezspire Following schedule: Every twenty eight days Frequency: Every four weeks Epi-Pen:Epi-Pen Available  Consent signed in office today and patient instructions given. Patient waited in the lobby for thirty minutes today without an issue.    Ralene Muskrat 01/15/2023, 11:50 AM

## 2023-01-15 NOTE — Progress Notes (Unsigned)
[place] [facility] [place] [facility] [place] [facility] [place] [facility] [place]   Follow-up Note  Referring Provider: Excell Seltzer, MD Primary Provider: Excell Seltzer, MD Date of Office Visit: 01/15/2023  Subjective:   [name] (DOB: Apr 13, 1946) is a 77 y.o. female who [facility] in re-evaluation of the following:  HPI: [name] returns to the clinic in evaluation of severe asthma, allergic rhinitis, LPR, history of pulmonary hypertension/diastolic dysfunction, and food allergy directed against shellfish and fish.  I last saw her in this clinic 27 Nov 2022.  She has noticed over the course of the past month some more problems with wheezing and coughing.  She has also had very significant dry mouth she is having problems using her CPAP machine because of wheezing and she feels as though she cannot breathe when using this machine.  She had very little problems with her nose. She has had very little problems with her reflux.  She remains away from fish and shellfish consumption.  She did visit with a rheumatologist for her possible gout and other arthralgic issues.  Allergies as of 01/15/2023       Reactions   Dilantin [phenytoin] Swelling   facial swelling   Latex Hives   Oxycodone Nausea And Vomiting, Nausea Only   Abdominal Pain, Vomiting   Penicillins Nausea And Vomiting, Swelling   Has patient had a PCN reaction causing immediate rash, facial/tongue/throat swelling, SOB or lightheadedness with hypotension patient had a PCN reaction causing severe rash involving mucus membranes or skin necrosis: [id number] Has patient had a PCN reaction that required hospitalization/No Has patient had a PCN reaction occurring within the last 10 years: No If all of the above answers are "NO", then may proceed with Cephalosporin use.   Codeine Nausea Only   Tolerates tylenol with codeine   Hydrocodone Nausea And Vomiting   Nickel Hives, Rash   Pregabalin  Itching, Other (See Comments)   headaches/problem w/vision        Medication List    ABSORBINE PLUS JR EX Apply 1 patch topically daily as needed (pain).   Advair HFA 230-21 MCG/ACT inhaler Generic drug: fluticasone-salmeterol 2 puffs 1-2 times daily depending on disease activity.   albuterol (2.5 MG/3ML) 0.083% nebulizer solution Commonly known as: PROVENTIL Take 3 mLs (2.5 mg total) by nebulization every 6 (six) hours as needed for wheezing or shortness of breath.   albuterol 108 (90 Base) MCG/ACT inhaler Commonly known as: VENTOLIN HFA INHALE 2 PUFFS BY MOUTH EVERY 6 HOURS AS NEEDED FOR WHEEZING OR SHORTNESS OF BREATH   anastrozole 1 MG tablet Commonly known as: ARIMIDEX TAKE 1 TABLET(1 MG) BY MOUTH DAILY   atorvastatin 10 MG tablet Commonly known as: LIPITOR TAKE 1 TABLET(10 MG) BY MOUTH DAILY   BinaxNOW COVID-19 Ag Home Test Kit Generic drug: COVID-19 At Home Antigen Test 8 kits by Does not apply route as needed.   BIOTENE DRY MOUTH MT Use as directed 1 Dose in the mouth or throat daily as needed (dry mouth).   CALCIUM PO Take 1,000 mg by mouth daily.   cyclobenzaprine 10 MG tablet Commonly known as: FLEXERIL Take 0.5-1 tablets (5-10 mg total) by mouth at bedtime as needed for muscle spasms.   DAIRY-RELIEF PO Take 1 capsule by mouth daily as needed (eating dairy).   desloratadine 5 MG tablet Commonly known as: Clarinex Take 1 tablet (5 mg total) by mouth daily.   EPINEPHrine 0.3 mg/0.3 mL Soaj injection  Commonly known as: EPI-PEN USE AS DIRECTED FOR LIFE THREATENING ALLERGIC REACTIONS   estradiol 0.1 MG/GM vaginal cream Commonly known as: ESTRACE Place 1 Applicatorful vaginally 2 (two) times a week. (As Needed)   famotidine 40 MG tablet Commonly known as: PEPCID Take 1 (ONE) tablet by mouth every evening.   fluticasone 220 MCG/ACT inhaler Commonly known as: Flovent HFA 2 puffs 2 times daily during flare up.   gabapentin 300 MG  capsule Commonly known as: NEURONTIN Take one capsule in the morning, one at lunch and two at bedtime   Glyxambi 10-5 MG Tabs Generic drug: Empagliflozin-linaGLIPtin TAKE 1 TABLET BY MOUTH DAILY   hydrOXYzine 10 MG tablet Commonly known as: ATARAX Take 1 tablet (10 mg total) by mouth daily as needed for itching.   ipratropium 0.06 % nasal spray Commonly known as: ATROVENT 1-2 sprays in each nostril 1-2 times per day as needed to dry nose   irbesartan 150 MG tablet Commonly known as: AVAPRO TAKE 1 TABLET(150 MG) BY MOUTH DAILY   levalbuterol 1.25 MG/3ML nebulizer solution Commonly known as: XOPENEX USE 1 VIAL VIA NEBULIZER EVERY 6 HOURS AS NEEDED FOR SHORTNESS OF BREATH OR WHEEZING   Magnesium 500 MG Tabs Take 500 mg by mouth daily.   methimazole 5 MG tablet Commonly known as: TAPAZOLE Take 1 tablet (5 mg total) by mouth every other day.   montelukast 10 MG tablet Commonly known as: SINGULAIR Take 1 tablet (10 mg total) by mouth at bedtime. For asthma control.   ondansetron 8 MG disintegrating tablet Commonly known as: ZOFRAN-ODT Take 1 tablet (8 mg total) by mouth every 8 (eight) hours as needed for nausea or vomiting.   OneTouch Delica Plus Lancet33G Misc USE TO CHECK BLOOD SUGAR UP TO TWICE DAILY   OneTouch Verio test strip Generic drug: glucose blood 1 strip 2 (two) times daily   OneTouch Verio w/Device Kit Use to check blood sugar up to 2 times a day   pantoprazole 40 MG tablet Commonly known as: PROTONIX Take 1 (ONE) tablet by mouth every morning.   polyethylene glycol powder 17 GM/SCOOP powder Commonly known as: GLYCOLAX/MIRALAX Take 1 Container by mouth once.   polyethylene glycol-electrolytes 420 g solution Commonly known as: NuLYTELY Take 4,000 mLs by mouth once.   PRESCRIPTION MEDICATION Inhale into the lungs at bedtime. CPAP   SALONPAS JET SPRAY EX Apply 1 spray topically daily as needed (knee pain).   Spiriva Respimat 1.25 MCG/ACT  Aers Generic drug: Tiotropium Bromide Monohydrate Inhale 1 Inhalation into the lungs daily.   SYSTANE OP Place 1 drop into both eyes daily as needed (dry eyes).   tolterodine 4 MG 24 hr capsule Commonly known as: DETROL LA Take 4 mg by mouth at bedtime.   Trulance 3 MG Tabs Generic drug: Plecanatide Take 3 mg by mouth daily as needed (constipation).   Xarelto 20 MG Tabs tablet Generic drug: rivaroxaban TAKE 1 TABLET(20 MG) BY MOUTH DAILY WITH SUPPER        Past Medical History:  Diagnosis Date   Allergic rhinitis    Allergy    Arthritis    Asthma    Breast cancer (HCC)    Chronic headache    Diabetes mellitus    Ductal carcinoma in situ (DCIS) of left breast 02/23/2022   DVT (deep venous thrombosis) (HCC)    Dyspnea    Heart murmur    History of radiation therapy    Left Breast 05/02/22-05/30/22- Dr. Antony Blackbird   Hypertension  Penicillin allergy 01/19/2020   Pulmonary embolism (HCC)    Pulmonary hypertension (HCC)    Seizures (HCC)     Past Surgical History:  Procedure Laterality Date   ABDOMINAL HYSTERECTOMY     partial, has ovaries   BREAST LUMPECTOMY WITH RADIOACTIVE SEED LOCALIZATION Left 03/29/2022   Procedure: LEFT BREAST LUMPECTOMY WITH RADIOACTIVE SEED LOCALIZATION;  Surgeon: Harriette Bouillon, MD;  Location: [facility] OR;  Service: General;  Laterality: Left;   CARDIAC CATHETERIZATION  12/28/2010   Mod. pulmonary hypertension, normal coronary arteries   CARDIOVERSION N/A 11/23/2020   Procedure: CARDIOVERSION;  Surgeon: Thurmon Fair, MD;  Location: [facility] ENDOSCOPY;  Service: Cardiovascular;  Laterality: N/A;   CHOLECYSTECTOMY N/A 12/20/2021   Procedure: LAPAROSCOPIC CHOLECYSTECTOMY WITH INTRAOPERATIVE CHOLANGIOGRAM;  Surgeon: Darnell Level, MD;  Location: [facility] ORS;  Service: General;  Laterality: N/A;   DOPPLER ECHOCARDIOGRAPHY  10/08/2011   EF=>55%,mild asymmetric LVH, mod. TR, mod. PH, mild to mod LA dilatation   IR LUMBAR DISC ASPIRATION W/IMG GUIDE  01/12/2020    KNEE ARTHROSCOPY Left    KNEE SURGERY     Nuclear Stress Test  05/20/2006   No ischemia   PARTIAL HYSTERECTOMY     PLANTAR FASCIA SURGERY     TONSILLECTOMY      Review of systems negative except as noted in HPI / PMHx or noted below:  Review of Systems  Constitutional: Negative.   HENT: Negative.    Eyes: Negative.   Respiratory: Negative.    Cardiovascular: Negative.   Gastrointestinal: Negative.   Genitourinary: Negative.   Musculoskeletal: Negative.   Skin: Negative.   Neurological: Negative.   Endo/Heme/Allergies: Negative.   Psychiatric/Behavioral: Negative.       Objective:   Vitals:   01/15/23 1043  BP: (!) 146/86  Pulse: 100  Resp: 20  Temp: 98.4 F (36.9 C)  SpO2: 95%   Height: 5' 4.25" (163.2 cm)  Weight: 205 lb 1.6 oz (93 kg)   Physical Exam Constitutional:      Appearance: She is not diaphoretic.  HENT:     Head: Normocephalic.     Right Ear: Tympanic membrane, ear canal and external ear normal.     Left Ear: Tympanic membrane, ear canal and external ear normal.     Nose: Nose normal. No mucosal edema or rhinorrhea.     Mouth/Throat:     Pharynx: Uvula midline. No oropharyngeal exudate.  Eyes:     Conjunctiva/sclera: Conjunctivae normal.  Neck:     Thyroid: No thyromegaly.     Trachea: Trachea normal. No tracheal tenderness or tracheal deviation.  Cardiovascular:     Rate and Rhythm: Normal rate and regular rhythm.     Heart sounds: S1 normal and S2 normal. Murmur (systolic) heard.  Pulmonary:     Effort: No respiratory distress.     Breath sounds: No stridor. Wheezing (expiratory wheezes) present. No rales.  Lymphadenopathy:     Head:     Right side of head: No tonsillar adenopathy.     Left side of head: No tonsillar adenopathy.     Cervical: No cervical adenopathy.  Skin:    Findings: No erythema or rash.     Nails: There is no clubbing.  Neurological:     Mental Status: She is alert.     Diagnostics:    Spirometry was  performed and demonstrated an FEV1 of 1.15 at 66 % of predicted.  Assessment and Plan:   1. Not well controlled severe persistent asthma   2. Other  allergic rhinitis   3. LPRD (laryngopharyngeal reflux disease)   4. Adverse food reaction, subsequent encounter   5. Dry mouth    1. Continue Advair 230 - 2 inhalations 2 times a day (empty lungs)  2. Continue Montelukast 10mg  - 1 tablet 1 time per day.  3. STOP Spiriva 2.5 respimat    4. CHANGE Xolair to Tezepelumab injections. First injection today in clinic  5. Continue pantoprazole 40mg  in the AM + Famotidine 40 mg in the PM.    6. If needed:   A. Nasal saline several times per day  B. Ipratropium 0.06% -1-2 sprays each nostril 1-2 times per day   C. OTC NASACORT - 1 spray each nostril 1 time per day   D. Proventil HFA or albuterol nebulization  E. Epi-pen  F.  Mucinex DM  G. Biotene mouthwash    7. Add Flovent 220 - 2 inhalations 2 times a day during "FLARE UP"  8. Return to clinic in 4 weeks or earlier if there is a problem.  Alexea has inflammation of her airway and she has very bad dry mouth and her current plan is not working to control the inflammatory state of her airway.  We will start her on tezepelumab injections to replace her omalizumab with first injection administered in clinic today.  I think we need to remove her Spiriva for her very bothersome dry mouth issue is getting much worse.  She can use Biotene mouthwash when needed.  I will have her introduce Flovent to her Advair while she is flared up.  Will see her back in this clinic in 4 weeks or earlier if there is a problem.   Laurette Schimke, MD Allergy / Immunology [facility]

## 2023-01-15 NOTE — Patient Instructions (Addendum)
  1. Continue Advair 230 - 2 inhalations 2 times a day (empty lungs)  2. Continue Montelukast 10mg  - 1 tablet 1 time per day.  3. STOP Spiriva 2.5 respimat    4. CHANGE Xolair to Tezepelumab injections. First injection today in clinic  5. Continue pantoprazole 40mg  in the AM + Famotidine 40 mg in the PM.    6. If needed:   A. Nasal saline several times per day  B. Ipratropium 0.06% -1-2 sprays each nostril 1-2 times per day   C. OTC NASACORT - 1 spray each nostril 1 time per day   D. Proventil HFA or albuterol nebulization  E. Epi-pen  F.  Mucinex DM  G. Biotene mouthwash    7. Add Flovent 220 - 2 inhalations 2 times a day during "FLARE UP"  8. Return to clinic in 4 weeks or earlier if there is a problem.

## 2023-01-16 ENCOUNTER — Other Ambulatory Visit (HOSPITAL_COMMUNITY): Payer: Self-pay

## 2023-01-16 ENCOUNTER — Encounter: Payer: Self-pay | Admitting: Allergy and Immunology

## 2023-01-18 ENCOUNTER — Other Ambulatory Visit (HOSPITAL_COMMUNITY): Payer: Self-pay

## 2023-01-22 ENCOUNTER — Encounter (HOSPITAL_BASED_OUTPATIENT_CLINIC_OR_DEPARTMENT_OTHER): Payer: Self-pay | Admitting: Emergency Medicine

## 2023-01-22 ENCOUNTER — Other Ambulatory Visit: Payer: Self-pay

## 2023-01-22 ENCOUNTER — Telehealth: Payer: Self-pay

## 2023-01-22 ENCOUNTER — Ambulatory Visit: Payer: Medicare Other

## 2023-01-22 ENCOUNTER — Emergency Department (HOSPITAL_BASED_OUTPATIENT_CLINIC_OR_DEPARTMENT_OTHER)
Admission: EM | Admit: 2023-01-22 | Discharge: 2023-01-22 | Disposition: A | Discharge status: Home / Self Care | Payer: Medicare Other | Attending: Emergency Medicine | Admitting: Emergency Medicine

## 2023-01-22 DIAGNOSIS — Z91040 Latex allergy status: Secondary | ICD-10-CM | POA: Insufficient documentation

## 2023-01-22 DIAGNOSIS — J449 Chronic obstructive pulmonary disease, unspecified: Secondary | ICD-10-CM | POA: Diagnosis not present

## 2023-01-22 DIAGNOSIS — Z7901 Long term (current) use of anticoagulants: Secondary | ICD-10-CM | POA: Insufficient documentation

## 2023-01-22 DIAGNOSIS — I480 Paroxysmal atrial fibrillation: Secondary | ICD-10-CM | POA: Insufficient documentation

## 2023-01-22 DIAGNOSIS — S91111A Laceration without foreign body of right great toe without damage to nail, initial encounter: Secondary | ICD-10-CM | POA: Diagnosis not present

## 2023-01-22 DIAGNOSIS — R58 Hemorrhage, not elsewhere classified: Secondary | ICD-10-CM | POA: Insufficient documentation

## 2023-01-22 NOTE — ED Notes (Signed)
This nurse and Dr. Judd Lien at bedside. Quick clot applied to patients right great toe.

## 2023-01-22 NOTE — ED Triage Notes (Signed)
Pt reports cutting her great right toe with a razor blade x 2 hours ago, pt reports she takes xarelto

## 2023-01-22 NOTE — Discharge Instructions (Signed)
Leave the dressing in place for the next 24 hours, then removed.  Dressing changes with bacitracin twice daily thereafter.

## 2023-01-22 NOTE — Telephone Encounter (Signed)
Patient called stating that she has been having some voice changes(voice getting deeper), and hearing some throat noises (can't really describe). She wants to know could this be coming from her Thyroid?

## 2023-01-22 NOTE — ED Notes (Signed)
Another layer of combat gauze added to right great toe and bandaged.

## 2023-01-22 NOTE — ED Provider Notes (Signed)
[facility] EMERGENCY DEPARTMENT AT [facility] Provider Note   CSN: [id number] Arrival date & time: 01/22/23  0136     History  Chief Complaint  Patient presents with   Laceration    [name] is a 77 y.o. female.  Patient is a 77 year old female with history of paroxysmal atrial fibrillation on Xarelto, COPD, Graves' disease, pulmonary hypertension, prior DVT.  Patient presenting today with complaints of bleeding.  She used a razor blade to try to remove dead skin from her right great toe.  She apparently cut it too deep and has been bleeding since.  She has been unable to get the bleeding stopped due to being on blood thinners.  The history is provided by the patient.       Home Medications Prior to Admission medications   Medication Sig Start Date End Date Taking? Authorizing Provider  ADVAIR HFA 230-21 MCG/ACT inhaler Inhale 2 puffs into the lungs 2 (two) times daily. 2 puffs 1-2 times daily depending on disease activity. 01/15/23   Kozlow, Alvira Philips, MD  albuterol (PROVENTIL) (2.5 MG/3ML) 0.083% nebulizer solution Take 3 mLs (2.5 mg total) by nebulization every 6 (six) hours as needed for wheezing or shortness of breath. 01/15/23   Kozlow, Alvira Philips, MD  albuterol (VENTOLIN HFA) 108 (90 Base) MCG/ACT inhaler INHALE 2 PUFFS BY MOUTH EVERY 6 HOURS AS NEEDED FOR WHEEZING OR SHORTNESS OF BREATH 01/15/23   Kozlow, Alvira Philips, MD  anastrozole (ARIMIDEX) 1 MG tablet TAKE 1 TABLET(1 MG) BY MOUTH DAILY 12/12/22   Serena Croissant, MD  atorvastatin (LIPITOR) 10 MG tablet TAKE 1 TABLET(10 MG) BY MOUTH DAILY 06/03/22   Bedsole, Amy E, MD  BINAXNOW COVID-19 AG HOME TEST KIT 8 kits by Does not apply route as needed. 01/08/23   [provider]  Blood Glucose Monitoring Suppl (ONETOUCH VERIO) w/Device KIT Use to check blood sugar up to 2 times a day 08/16/20   Bedsole, Amy E, MD  CALCIUM PO Take 1,000 mg by mouth daily.    [provider]  cyclobenzaprine (FLEXERIL) 10 MG tablet Take  0.5-1 tablets (5-10 mg total) by mouth at bedtime as needed for muscle spasms. 03/09/22   Bedsole, Amy E, MD  desloratadine (CLARINEX) 5 MG tablet Take 1 tablet (5 mg total) by mouth daily. 07/31/22   Kozlow, Alvira Philips, MD  EPINEPHrine 0.3 mg/0.3 mL IJ SOAJ injection USE AS DIRECTED FOR LIFE THREATENING ALLERGIC REACTIONS 01/15/23   Kozlow, Alvira Philips, MD  estradiol (ESTRACE) 0.1 MG/GM vaginal cream Place 1 Applicatorful vaginally 2 (two) times a week. (As Needed)    [provider]  famotidine (PEPCID) 40 MG tablet Take 1 (ONE) tablet by mouth every evening. 01/15/23   Kozlow, Alvira Philips, MD  fluticasone (FLOVENT HFA) 220 MCG/ACT inhaler Inhale 2 puffs into the lungs 2 (two) times daily. 2 puffs 2 times daily during flare up. 01/15/23   Kozlow, Alvira Philips, MD  gabapentin (NEURONTIN) 300 MG capsule Take one capsule in the morning, one at lunch and two at bedtime 09/05/22   Hyatt, Max T, DPM  glucose blood (ONETOUCH VERIO) test strip 1 strip 2 (two) times daily 01/29/22   [provider]  GLYXAMBI 10-5 MG TABS TAKE 1 TABLET BY MOUTH DAILY 12/03/22   Bedsole, Amy E, MD  hydrOXYzine (ATARAX/VISTARIL) 10 MG tablet Take 1 tablet (10 mg total) by mouth daily as needed for itching. 04/18/21   Bedsole, Amy E, MD  ipratropium (ATROVENT) 0.06 % nasal spray  Place 2 sprays into both nostrils 2 (two) times daily as needed for rhinitis. 1-2 sprays in each nostril 1-2 times per day as needed to dry nose 01/15/23   Kozlow, Alvira Philips, MD  irbesartan (AVAPRO) 150 MG tablet TAKE 1 TABLET(150 MG) BY MOUTH DAILY 07/02/22   Croitoru, Mihai, MD  Lactase (DAIRY-RELIEF PO) Take 1 capsule by mouth daily as needed (eating dairy).     [provider]  Lancets (ONETOUCH DELICA PLUS LANCET33G) MISC USE TO CHECK BLOOD SUGAR UP TO TWICE DAILY 01/29/22   Bedsole, Amy E, MD  levalbuterol (XOPENEX) 1.25 MG/3ML nebulizer solution USE 1 VIAL VIA NEBULIZER EVERY 6 HOURS AS NEEDED FOR SHORTNESS OF BREATH OR WHEEZING 09/10/22   Kozlow, Alvira Philips, MD   Magnesium 500 MG TABS Take 500 mg by mouth daily.    [provider]  Menthol, Topical Analgesic, (ABSORBINE PLUS JR EX) Apply 1 patch topically daily as needed (pain).    [provider]  Menthol-Methyl Salicylate (SALONPAS JET SPRAY EX) Apply 1 spray topically daily as needed (knee pain).    [provider]  methimazole (TAPAZOLE) 5 MG tablet Take 1 tablet (5 mg total) by mouth every other day. 05/02/22   Carlus Pavlov, MD  montelukast (SINGULAIR) 10 MG tablet Take 1 tablet (10 mg total) by mouth at bedtime. For asthma control. 01/15/23   Kozlow, Alvira Philips, MD  Mouthwashes (BIOTENE DRY MOUTH MT) Use as directed 1 Dose in the mouth or throat daily as needed (dry mouth).    [provider]  ondansetron (ZOFRAN-ODT) 8 MG disintegrating tablet Take 1 tablet (8 mg total) by mouth every 8 (eight) hours as needed for nausea or vomiting. 10/16/21   Lorre Nick, MD  pantoprazole (PROTONIX) 40 MG tablet Take 1 (ONE) tablet by mouth every morning. 11/27/22   Kozlow, Alvira Philips, MD  Plecanatide (TRULANCE) 3 MG TABS Take 3 mg by mouth daily as needed (constipation).    [provider]  Polyethyl Glycol-Propyl Glycol (SYSTANE OP) Place 1 drop into both eyes daily as needed (dry eyes).    [provider]  polyethylene glycol powder (GLYCOLAX/MIRALAX) 17 GM/SCOOP powder Take 1 Container by mouth once.    [provider]  polyethylene glycol-electrolytes (NULYTELY) 420 g solution Take 4,000 mLs by mouth once. 09/18/22   [provider]  PRESCRIPTION MEDICATION Inhale into the lungs at bedtime. CPAP    [provider]  Respiratory Therapy Supplies (NEBULIZER MASK ADULT) MISC 1 kit by Does not apply route as directed. 01/15/23   Kozlow, Alvira Philips, MD  Tiotropium Bromide Monohydrate (SPIRIVA RESPIMAT) 1.25 MCG/ACT AERS Inhale 1 Inhalation into the lungs daily. 11/27/22   Kozlow, Alvira Philips, MD  tolterodine (DETROL LA) 4 MG 24 hr capsule Take 4 mg by  mouth at bedtime.    Alfredo Martinez, MD  triamcinolone (NASACORT) 55 MCG/ACT AERO nasal inhaler Place 2 sprays into the nose daily. 01/15/23   Kozlow, Alvira Philips, MD  XARELTO 20 MG TABS tablet TAKE 1 TABLET(20 MG) BY MOUTH DAILY WITH SUPPER 07/26/22   Croitoru, Mihai, MD      Allergies    Dilantin [phenytoin], Latex, Oxycodone, Penicillins, Codeine, Hydrocodone, Nickel, and Pregabalin    Review of Systems   Review of Systems  All other systems reviewed and are negative.   Physical Exam Updated Vital Signs BP (!) 134/113 (BP Location: Left Arm)   Pulse 73   Temp 98 F (36.7 C) (Oral)   Resp 18  Ht 5\' 4"  (1.626 m)   Wt 93 kg   SpO2 96%   BMI 35.19 kg/m  Physical Exam Vitals and nursing note reviewed.  Constitutional:      Appearance: Normal appearance.  Pulmonary:     Effort: Pulmonary effort is normal.  Skin:    General: Skin is warm and dry.     Comments: To the medial aspect of the base of the right great toe, there is an area of recently excised tissue with continuous bleeding/oozing from the site.  The area is approximately 2 cm round.  Neurological:     Mental Status: She is alert.     ED Results / Procedures / Treatments   Labs (all labs ordered are listed, but only abnormal results are displayed) Labs Reviewed - No data to display  EKG None  Radiology No results found.  Procedures Procedures    Medications Ordered in ED Medications - No data to display  ED Course/ Medical Decision Making/ A&P  Patient is a 77 year old female presenting with persistent bleeding from her right great toe after using a razor to remove a callus.  She takes Xarelto and has had difficulty getting the bleeding to stop.  She does arrive here with some active bleeding.  Vital signs otherwise stable and physical examination unremarkable.  Combat gauze applied with good hemostasis.  Patient to be discharged with local wound care and as needed return.  Final Clinical Impression(s)  / ED Diagnoses Final diagnoses:  None    Rx / DC Orders ED Discharge Orders     None         Geoffery Lyons, MD 01/22/23 [phone]

## 2023-01-23 ENCOUNTER — Ambulatory Visit: Payer: Medicare Other | Attending: Nurse Practitioner | Admitting: Nurse Practitioner

## 2023-01-23 ENCOUNTER — Encounter: Payer: Self-pay | Admitting: Nurse Practitioner

## 2023-01-23 ENCOUNTER — Ambulatory Visit (INDEPENDENT_AMBULATORY_CARE_PROVIDER_SITE_OTHER): Payer: Medicare Other | Admitting: Family Medicine

## 2023-01-23 ENCOUNTER — Encounter: Payer: Self-pay | Admitting: Family Medicine

## 2023-01-23 VITALS — BP 120/68 | HR 74 | Ht 64.0 in | Wt 203.2 lb

## 2023-01-23 VITALS — BP 130/70 | HR 76 | Temp 98.4°F | Ht 64.25 in | Wt 201.2 lb

## 2023-01-23 DIAGNOSIS — I484 Atypical atrial flutter: Secondary | ICD-10-CM

## 2023-01-23 DIAGNOSIS — E0590 Thyrotoxicosis, unspecified without thyrotoxic crisis or storm: Secondary | ICD-10-CM | POA: Diagnosis not present

## 2023-01-23 DIAGNOSIS — G4733 Obstructive sleep apnea (adult) (pediatric): Secondary | ICD-10-CM | POA: Diagnosis not present

## 2023-01-23 DIAGNOSIS — I2721 Secondary pulmonary arterial hypertension: Secondary | ICD-10-CM

## 2023-01-23 DIAGNOSIS — E1140 Type 2 diabetes mellitus with diabetic neuropathy, unspecified: Secondary | ICD-10-CM

## 2023-01-23 DIAGNOSIS — I5032 Chronic diastolic (congestive) heart failure: Secondary | ICD-10-CM | POA: Diagnosis not present

## 2023-01-23 DIAGNOSIS — S91111A Laceration without foreign body of right great toe without damage to nail, initial encounter: Secondary | ICD-10-CM | POA: Insufficient documentation

## 2023-01-23 DIAGNOSIS — Z86711 Personal history of pulmonary embolism: Secondary | ICD-10-CM

## 2023-01-23 DIAGNOSIS — E785 Hyperlipidemia, unspecified: Secondary | ICD-10-CM

## 2023-01-23 DIAGNOSIS — S91111D Laceration without foreign body of right great toe without damage to nail, subsequent encounter: Secondary | ICD-10-CM | POA: Diagnosis not present

## 2023-01-23 DIAGNOSIS — I10 Essential (primary) hypertension: Secondary | ICD-10-CM | POA: Diagnosis not present

## 2023-01-23 DIAGNOSIS — I480 Paroxysmal atrial fibrillation: Secondary | ICD-10-CM | POA: Diagnosis not present

## 2023-01-23 DIAGNOSIS — R252 Cramp and spasm: Secondary | ICD-10-CM | POA: Diagnosis not present

## 2023-01-23 DIAGNOSIS — J4540 Moderate persistent asthma, uncomplicated: Secondary | ICD-10-CM | POA: Diagnosis not present

## 2023-01-23 NOTE — Progress Notes (Signed)
Patient ID: [name], female    DOB: 03-31-1946, 77 y.o.   MRN: [id number]  This visit was conducted in person.  BP 130/70 (BP Location: Left Arm, Patient Position: Sitting, Cuff Size: Large)   Pulse 76   Temp 98.4 F (36.9 C) (Temporal)   Ht 5' 4.25" (1.632 m)   Wt 201 lb 4 oz (91.3 kg)   SpO2 96%   BMI 34.28 kg/m    CC:  Chief Complaint  Patient presents with   Leg Cramps   Extremity Laceration    Right Big Toe-Seen in ER yesterday    Subjective:   HPI: [name] is a 77 y.o. female presenting on 01/23/2023 for Leg Cramps and Extremity Laceration (Right Big Toe-Seen in ER yesterday)   Reviewed recent ER note from 4 right great toe laceration. She had been using a razor blade to remove dead skin from her right great toe and apparently cut daily.  On Xarelto. Combat gauze applied with good hemostasis.  Local wound care  Today she reports she has been treating with  gauze.    She has also been noting leg cramps which is a fairly common complaint for her.  They had resolved but now have returned. Right lower leg. No new medications. Has had negative work up in past. She has been pushing lots of water.  Needs a letter for being treated for leg cramps. She needs this for  Relevant past medical, surgical, family and social history reviewed and updated as indicated. Interim medical history since our last visit reviewed. Allergies and medications reviewed and updated. Outpatient Medications Prior to Visit  Medication Sig Dispense Refill   ADVAIR HFA 230-21 MCG/ACT inhaler Inhale 2 puffs into the lungs 2 (two) times daily. 2 puffs 1-2 times daily depending on disease activity. 36 g 1   albuterol (PROVENTIL) (2.5 MG/3ML) 0.083% nebulizer solution Take 3 mLs (2.5 mg total) by nebulization every 6 (six) hours as needed for wheezing or shortness of breath. 150 mL 1   albuterol (VENTOLIN HFA) 108 (90 Base) MCG/ACT inhaler INHALE 2 PUFFS BY MOUTH EVERY 6 HOURS AS  NEEDED FOR WHEEZING OR SHORTNESS OF BREATH 18 g 1   anastrozole (ARIMIDEX) 1 MG tablet TAKE 1 TABLET(1 MG) BY MOUTH DAILY 90 tablet 3   atorvastatin (LIPITOR) 10 MG tablet TAKE 1 TABLET(10 MG) BY MOUTH DAILY 90 tablet 3   BINAXNOW COVID-19 AG HOME TEST KIT 8 kits by Does not apply route as needed.     Blood Glucose Monitoring Suppl (ONETOUCH VERIO) w/Device KIT Use to check blood sugar up to 2 times a day 1 kit 0   CALCIUM PO Take 1,000 mg by mouth daily.     cyclobenzaprine (FLEXERIL) 10 MG tablet Take 0.5-1 tablets (5-10 mg total) by mouth at bedtime as needed for muscle spasms. 20 tablet 0   desloratadine (CLARINEX) 5 MG tablet Take 1 tablet (5 mg total) by mouth daily. 90 tablet 1   EPINEPHrine 0.3 mg/0.3 mL IJ SOAJ injection USE AS DIRECTED FOR LIFE THREATENING ALLERGIC REACTIONS 2 each 1   estradiol (ESTRACE) 0.1 MG/GM vaginal cream Place 1 Applicatorful vaginally 2 (two) times a week. (As Needed)     famotidine (PEPCID) 40 MG tablet Take 1 (ONE) tablet by mouth every evening. 90 tablet 1   fluticasone (FLOVENT HFA) 220 MCG/ACT inhaler Inhale 2 puffs into the lungs 2 (two) times daily. 2 puffs 2 times daily during flare up. 36 g 1  gabapentin (NEURONTIN) 300 MG capsule Take one capsule in the morning, one at lunch and two at bedtime 360 capsule 3   glucose blood (ONETOUCH VERIO) test strip 1 strip 2 (two) times daily     GLYXAMBI 10-5 MG TABS TAKE 1 TABLET BY MOUTH DAILY 30 tablet 5   hydrOXYzine (ATARAX/VISTARIL) 10 MG tablet Take 1 tablet (10 mg total) by mouth daily as needed for itching. 30 tablet 2   ipratropium (ATROVENT) 0.06 % nasal spray Place 2 sprays into both nostrils 2 (two) times daily as needed for rhinitis. 1-2 sprays in each nostril 1-2 times per day as needed to dry nose 45 mL 1   irbesartan (AVAPRO) 150 MG tablet TAKE 1 TABLET(150 MG) BY MOUTH DAILY 90 tablet 2   Lactase (DAIRY-RELIEF PO) Take 1 capsule by mouth daily as needed (eating dairy).      Lancets (ONETOUCH  DELICA PLUS LANCET33G) MISC USE TO CHECK BLOOD SUGAR UP TO TWICE DAILY 100 each 5   levalbuterol (XOPENEX) 1.25 MG/3ML nebulizer solution USE 1 VIAL VIA NEBULIZER EVERY 6 HOURS AS NEEDED FOR SHORTNESS OF BREATH OR WHEEZING 90 mL 3   Magnesium 500 MG TABS Take 500 mg by mouth daily.     Menthol, Topical Analgesic, (ABSORBINE PLUS JR EX) Apply 1 patch topically daily as needed (pain).     Menthol-Methyl Salicylate (SALONPAS JET SPRAY EX) Apply 1 spray topically daily as needed (knee pain).     methimazole (TAPAZOLE) 5 MG tablet Take 1 tablet (5 mg total) by mouth every other day. 45 tablet 3   montelukast (SINGULAIR) 10 MG tablet Take 1 tablet (10 mg total) by mouth at bedtime. For asthma control. 90 tablet 1   Mouthwashes (BIOTENE DRY MOUTH MT) Use as directed 1 Dose in the mouth or throat daily as needed (dry mouth).     ondansetron (ZOFRAN-ODT) 8 MG disintegrating tablet Take 1 tablet (8 mg total) by mouth every 8 (eight) hours as needed for nausea or vomiting. 20 tablet 0   pantoprazole (PROTONIX) 40 MG tablet Take 1 (ONE) tablet by mouth every morning. 90 tablet 1   Plecanatide (TRULANCE) 3 MG TABS Take 3 mg by mouth daily as needed (constipation).     Polyethyl Glycol-Propyl Glycol (SYSTANE OP) Place 1 drop into both eyes daily as needed (dry eyes).     polyethylene glycol powder (GLYCOLAX/MIRALAX) 17 GM/SCOOP powder Take 1 Container by mouth once.     polyethylene glycol-electrolytes (NULYTELY) 420 g solution Take 4,000 mLs by mouth once.     PRESCRIPTION MEDICATION Inhale into the lungs at bedtime. CPAP     Respiratory Therapy Supplies (NEBULIZER MASK ADULT) MISC 1 kit by Does not apply route as directed. 1 each 1   Tiotropium Bromide Monohydrate (SPIRIVA RESPIMAT) 1.25 MCG/ACT AERS Inhale 1 Inhalation into the lungs daily. 4 g 5   tolterodine (DETROL LA) 4 MG 24 hr capsule Take 4 mg by mouth at bedtime.     triamcinolone (NASACORT) 55 MCG/ACT AERO nasal inhaler Place 2 sprays into the nose  daily. 50.7 mL 1   XARELTO 20 MG TABS tablet TAKE 1 TABLET(20 MG) BY MOUTH DAILY WITH SUPPER 90 tablet 1   Facility-Administered Medications Prior to Visit  Medication Dose Route Frequency Provider Last Rate Last Admin   omalizumab Geoffry Paradise) injection 300 mg  300 mg Subcutaneous Q28 days Jessica Priest, MD   300 mg at 12/25/22 0921   tezepelumab-ekko (TEZSPIRE) 210 MG/1. syringe 210 mg  210 mg  Subcutaneous Q28 days Jessica Priest, MD   210 mg at 01/15/23 1136     Per HPI unless specifically indicated in ROS section below Review of Systems  Constitutional:  Negative for fatigue and fever.  HENT:  Negative for congestion.   Eyes:  Negative for pain.  Respiratory:  Negative for cough and shortness of breath.   Cardiovascular:  Negative for chest pain, palpitations and leg swelling.  Gastrointestinal:  Negative for abdominal pain.  Genitourinary:  Negative for dysuria and vaginal bleeding.  Musculoskeletal:  Negative for back pain.  Neurological:  Negative for syncope, light-headedness and headaches.  Psychiatric/Behavioral:  Negative for dysphoric mood.    Objective:  BP 130/70 (BP Location: Left Arm, Patient Position: Sitting, Cuff Size: Large)   Pulse 76   Temp 98.4 F (36.9 C) (Temporal)   Ht 5' 4.25" (1.632 m)   Wt 201 lb 4 oz (91.3 kg)   SpO2 96%   BMI 34.28 kg/m   Wt Readings from Last 3 Encounters:  01/23/23 201 lb 4 oz (91.3 kg)  01/22/23 205 lb (93 kg)  01/15/23 205 lb 1.6 oz (93 kg)      Physical Exam Constitutional:      General: She is not in acute distress.    Appearance: Normal appearance. She is well-developed. She is not ill-appearing or toxic-appearing.  HENT:     Head: Normocephalic.     Right Ear: Hearing, tympanic membrane, ear canal and external ear normal. Tympanic membrane is not erythematous, retracted or bulging.     Left Ear: Hearing, tympanic membrane, ear canal and external ear normal. Tympanic membrane is not erythematous, retracted or  bulging.     Nose: No mucosal edema or rhinorrhea.     Right Sinus: No maxillary sinus tenderness or frontal sinus tenderness.     Left Sinus: No maxillary sinus tenderness or frontal sinus tenderness.     Mouth/Throat:     Pharynx: Uvula midline.  Eyes:     General: Lids are normal. Lids are everted, no foreign bodies appreciated.     Conjunctiva/sclera: Conjunctivae normal.     Pupils: Pupils are equal, round, and reactive to light.  Neck:     Thyroid: No thyroid mass or thyromegaly.     Vascular: No carotid bruit.     Trachea: Trachea normal.  Cardiovascular:     Rate and Rhythm: Normal rate and regular rhythm.     Pulses: Normal pulses.     Heart sounds: Normal heart sounds, S1 normal and S2 normal. No murmur heard.    No friction rub. No gallop.  Pulmonary:     Effort: Pulmonary effort is normal. No tachypnea or respiratory distress.     Breath sounds: Normal breath sounds. No decreased breath sounds, wheezing, rhonchi or rales.  Abdominal:     General: Bowel sounds are normal.     Palpations: Abdomen is soft.     Tenderness: There is no abdominal tenderness.  Musculoskeletal:     Cervical back: Normal range of motion and neck supple.  Skin:    General: Skin is warm and dry.     Findings: No rash.  Neurological:     Mental Status: She is alert.  Psychiatric:        Mood and Affect: Mood is not anxious or depressed.        Speech: Speech normal.        Behavior: Behavior normal. Behavior is cooperative.  Thought Content: Thought content normal.        Judgment: Judgment normal.       Results for orders placed or performed in visit on 11/21/22  [facility] COLONOSCOPY  Result Value Ref Range   [facility] Colonoscopy See Report (in chart) See Report (in chart), Patient Reported   *Note: Due to a large number of results and/or encounters for the requested time period, some results have not been displayed. A complete set of results can be found in Results Review.    Assessment  and Plan  Leg cramps Assessment & Plan: Chronic, intermittent. Recommended increased water gentle stretching and elevation as needed.   Laceration of right great toe without foreign body present or damage to nail, subsequent encounter Assessment & Plan: Acute, well-healing, hemostatic.  No associated cellulitis. Area washed with warm soapy water by CMA.  Bandaged with Neosporin and gauze.  Commended continue daily washing and topical antibiotic.  Recommended keeping bandage in place given on foot.  Return and ER precautions provided     No follow-ups on file.   Kerby Nora, MD

## 2023-01-23 NOTE — Assessment & Plan Note (Signed)
Acute, well-healing, hemostatic.  No associated cellulitis. Area washed with warm soapy water by CMA.  Bandaged with Neosporin and gauze.  Commended continue daily washing and topical antibiotic.  Recommended keeping bandage in place given on foot.  Return and ER precautions provided

## 2023-01-23 NOTE — Progress Notes (Signed)
Office Visit    Patient Name: [name] Date of Encounter: 01/23/2023  Primary Care Provider:  Excell Seltzer, MD Primary Cardiologist:  Thurmon Fair, MD  Chief Complaint    77 year old female with a history of atrial flutter s/p DCCV in 11/2020, paroxysmal atrial fibrillation, chronic diastolic heart failure, remote DVT/PE, moderate pulmonary hypertension, hypertension, hyperlipidemia, OSA on CPAP, type 2 diabetes, hyperthyroidism, chronic diarrhea, chronic reactive airway disease, DJD, seizures and breast cancer who presents for follow-up related to atrial fibrillation/atrial flutter.   Past Medical History    Past Medical History:  Diagnosis Date   Allergic rhinitis    Allergy    Arthritis    Asthma    Breast cancer (HCC)    Chronic headache    Diabetes mellitus    Ductal carcinoma in situ (DCIS) of left breast 02/23/2022   DVT (deep venous thrombosis) (HCC)    Dyspnea    Heart murmur    History of radiation therapy    Left Breast 05/02/22-05/30/22- Dr. Antony Blackbird   Hypertension    Penicillin allergy 01/19/2020   Pulmonary embolism (HCC)    Pulmonary hypertension (HCC)    Seizures (HCC)    Past Surgical History:  Procedure Laterality Date   ABDOMINAL HYSTERECTOMY     partial, has ovaries   BREAST LUMPECTOMY WITH RADIOACTIVE SEED LOCALIZATION Left 03/29/2022   Procedure: LEFT BREAST LUMPECTOMY WITH RADIOACTIVE SEED LOCALIZATION;  Surgeon: Harriette Bouillon, MD;  Location: [facility] OR;  Service: General;  Laterality: Left;   CARDIAC CATHETERIZATION  12/28/2010   Mod. pulmonary hypertension, normal coronary arteries   CARDIOVERSION N/A 11/23/2020   Procedure: CARDIOVERSION;  Surgeon: Thurmon Fair, MD;  Location: [facility];  Service: Cardiovascular;  Laterality: N/A;   CHOLECYSTECTOMY N/A 12/20/2021   Procedure: LAPAROSCOPIC CHOLECYSTECTOMY WITH INTRAOPERATIVE CHOLANGIOGRAM;  Surgeon: Darnell Level, MD;  Location: [facility];  Service: General;  Laterality: N/A;    DOPPLER ECHOCARDIOGRAPHY  10/08/2011   EF=>55%,mild asymmetric LVH, mod. TR, mod. PH, mild to mod LA dilatation   IR LUMBAR DISC ASPIRATION W/IMG GUIDE  01/12/2020   KNEE ARTHROSCOPY Left    KNEE SURGERY     Nuclear Stress Test  05/20/2006   No ischemia   PARTIAL HYSTERECTOMY     PLANTAR FASCIA SURGERY     TONSILLECTOMY      Allergies  Allergies  Allergen Reactions   Dilantin [Phenytoin] Swelling    facial swelling   Latex Hives   Oxycodone Nausea And Vomiting and Nausea Only    Abdominal Pain, Vomiting   Penicillins Nausea And Vomiting and Swelling    Has patient had a PCN reaction causing immediate rash, facial/tongue/throat swelling, SOB or lightheadedness with hypotension patient had a PCN reaction causing severe rash involving mucus membranes or skin necrosis: [id number] Has patient had a PCN reaction that required hospitalization/No Has patient had a PCN reaction occurring within the last 10 years: No If all of the above answers are "NO", then may proceed with Cephalosporin use.      Codeine Nausea Only    Tolerates tylenol with codeine   Hydrocodone Nausea And Vomiting   Nickel Hives and Rash   Pregabalin Itching and Other (See Comments)    headaches/problem w/vision     Labs/Other Studies Reviewed    The following studies were reviewed today:  Cardiac Studies & Procedures       ECHOCARDIOGRAM  ECHOCARDIOGRAM COMPLETE 11/07/2020  Narrative ECHOCARDIOGRAM REPORT    Patient Name:   [name]  Date of Exam: 11/07/2020 Medical Rec #:  [id number]       Height:       64.0 in Accession #:    [id number]      Weight:       199.2 lb Date of Birth:  07-Sep-1945       BSA:          1.953 m Patient Age:    75 years        BP:           90/54 mmHg Patient Gender: F               HR:           105 bpm. Exam Location:  [address]  Procedure: 2D Echo, Cardiac Doppler and Color Doppler  Indications:    I50.30 CHF  History:        Patient has prior history of  Echocardiogram examinations, most recent 04/20/2015. CHF, Pulmonary HTN, Arrythmias:Atrial Fibrillation; Risk Factors:Hypertension and Diabetes.  Sonographer:    Samule Ohm RDCS Referring Phys: [facility] CROITORU  IMPRESSIONS   1. Rhythm appears to be atrial flutter with variable conduction. Left ventricular ejection fraction, by estimation, is 60 to 65%. The left ventricle has normal function. The left ventricle has no regional wall motion abnormalities. There is moderate asymmetric left ventricular hypertrophy of the basal-septal segment. Left ventricular diastolic parameters are indeterminate. 2. Right ventricular systolic function is normal. The right ventricular size is normal. There is mildly elevated pulmonary artery systolic pressure. The estimated right ventricular systolic pressure is 44.0 mmHg. 3. Left atrial size was mildly dilated. 4. Right atrial size was moderately dilated. 5. The mitral valve is normal in structure. Trivial mitral valve regurgitation. No evidence of mitral stenosis. 6. Tricuspid valve regurgitation is moderate. 7. The aortic valve is tricuspid. Aortic valve regurgitation is not visualized. No aortic stenosis is present. 8. The inferior vena cava is normal in size with greater than 50% respiratory variability, suggesting right atrial pressure of 3 mmHg.  FINDINGS Left Ventricle: Left ventricular ejection fraction, by estimation, is 60 to 65%. The left ventricle has normal function. The left ventricle has no regional wall motion abnormalities. The left ventricular internal cavity size was normal in size. There is moderate asymmetric left ventricular hypertrophy of the basal-septal segment. Left ventricular diastolic parameters are indeterminate.  Right Ventricle: The right ventricular size is normal. No increase in right ventricular wall thickness. Right ventricular systolic function is normal. There is mildly elevated pulmonary artery systolic pressure. The  tricuspid regurgitant velocity is 3.20 m/s, and with an assumed right atrial pressure of 3 mmHg, the estimated right ventricular systolic pressure is 44.0 mmHg.  Left Atrium: Left atrial size was mildly dilated.  Right Atrium: Right atrial size was moderately dilated.  Pericardium: There is no evidence of pericardial effusion.  Mitral Valve: The mitral valve is normal in structure. Trivial mitral valve regurgitation. No evidence of mitral valve stenosis.  Tricuspid Valve: The tricuspid valve is normal in structure. Tricuspid valve regurgitation is moderate.  Aortic Valve: The aortic valve is tricuspid. Aortic valve regurgitation is not visualized. No aortic stenosis is present.  Pulmonic Valve: The pulmonic valve was not well visualized. Pulmonic valve regurgitation is not visualized.  Aorta: The aortic root and ascending aorta are structurally normal, with no evidence of dilitation.  Venous: The inferior vena cava is normal in size with greater than 50% respiratory variability, suggesting right atrial pressure of 3 mmHg.  IAS/Shunts: No  atrial level shunt detected by color flow Doppler.   LEFT VENTRICLE PLAX 2D LVIDd:         4.20 cm  Diastology LVIDs:         2.70 cm  LV e' medial:    10.13 cm/s LV PW:         0.90 cm  LV E/e' medial:  11.5 LV IVS:        1.30 cm  LV e' lateral:   13.13 cm/s LVOT diam:     2.00 cm  LV E/e' lateral: 8.8 LV SV:         58 LV SV Index:   30 LVOT Area:     3.14 cm   RIGHT VENTRICLE             IVC RV S prime:     12.00 cm/s  IVC diam: 1.90 cm TAPSE (M-mode): 2.2 cm RVSP:           44.0 mmHg  LEFT ATRIUM             Index       RIGHT ATRIUM           Index LA diam:        4.00 cm 2.05 cm/m  RA Pressure: 3.00 mmHg LA Vol (A2C):   52.4 ml 26.83 ml/m RA Area:     25.20 cm LA Vol (A4C):   71.9 ml 36.81 ml/m RA Volume:   83.10 ml  42.55 ml/m LA Biplane Vol: 67.1 ml 34.36 ml/m AORTIC VALVE LVOT Vmax:   102.56 cm/s LVOT Vmean:  72.040  cm/s LVOT VTI:    0.184 m  AORTA Ao Root diam: 2.70 cm Ao Asc diam:  3.60 cm  MV E velocity: 116.20 cm/s  TRICUSPID VALVE TR Peak grad:   41.0 mmHg TR Vmax:        320.00 cm/s Estimated RAP:  3.00 mmHg RVSP:           44.0 mmHg  SHUNTS Systemic VTI:  0.18 m Systemic Diam: 2.00 cm  Epifanio Lesches MD Electronically signed by Epifanio Lesches MD Signature Date/Time: 11/07/2020/10:22:15 PM    Final            Recent Labs: 02/28/2022: Hemoglobin 12.2; Platelet Count 253 04/24/2022: ALT 9; BUN 13; Creatinine, Ser 1.04; Potassium 4.2; Sodium 140 06/20/2022: TSH 2.96  Recent Lipid Panel    Component Value Date/Time   CHOL 106 04/24/2022 1003   TRIG 49.0 04/24/2022 1003   HDL 56.40 04/24/2022 1003   CHOLHDL 2 04/24/2022 1003   VLDL 9.8 04/24/2022 1003   LDLCALC 40 04/24/2022 1003    History of Present Illness    77 year old female with the above past medical history including  atrial flutter s/p DCCV in 11/2020, paroxysmal atrial fibrillation, chronic diastolic heart failure, remote DVT/PE, moderate pulmonary hypertension, hypertension, hyperlipidemia, OSA on CPAP, type 2 diabetes, hyperthyroidism, chronic diarrhea, chronic reactive airway disease, DJD, seizures and breast cancer who presents for follow-up related to atrial fibrillation/atrial flutter.   Previous coronary angiography showed no evidence of CAD though she did have moderate pulmonary artery hypertension RHC (systolic PAP 50 mmHg). Lexiscan Myoview in 2019 was low risk, no evidence of ischemia.  Remote history of atrial fibrillation.  Echocardiogram in April 2022 showed EF 60 to 65%, moderate asymmetric LVH of the basal-septal segment, indeterminate diastolic parameters mildly elevated PASP.  She was noted to be in atrial flutter at the time and underwent DCCV in May  2022.  She was hospitalized in 12/2021 in the setting of acute calculus cholecystitis with sepsis.  She underwent laparoscopic cholecystectomy.  She  was last seen in the office on 12/28/2021 and was stable from a cardiac standpoint.  She presented to the ED on 01/22/2023 with concern for bleeding after cutting her right great toe with a razor blade (she was attempting to remove dead skin and cut too deep by accident). The bleeding was ultimately controlled and she was discharged home in stable condition.  She presents today for follow-up accompanied by her caregiver.  Since her last visit she has been stable from a cardiac standpoint.  She denies any chest pain, dyspnea, palpitations, edema, PND, orthopnea, weight gain.  Overall, she reports feeling well.  Home Medications    Current Outpatient Medications  Medication Sig Dispense Refill   ADVAIR HFA 230-21 MCG/ACT inhaler Inhale 2 puffs into the lungs 2 (two) times daily. 2 puffs 1-2 times daily depending on disease activity. 36 g 1   albuterol (PROVENTIL) (2.5 MG/3ML) 0.083% nebulizer solution Take 3 mLs (2.5 mg total) by nebulization every 6 (six) hours as needed for wheezing or shortness of breath. 150 mL 1   albuterol (VENTOLIN HFA) 108 (90 Base) MCG/ACT inhaler INHALE 2 PUFFS BY MOUTH EVERY 6 HOURS AS NEEDED FOR WHEEZING OR SHORTNESS OF BREATH 18 g 1   anastrozole (ARIMIDEX) 1 MG tablet TAKE 1 TABLET(1 MG) BY MOUTH DAILY 90 tablet 3   atorvastatin (LIPITOR) 10 MG tablet TAKE 1 TABLET(10 MG) BY MOUTH DAILY 90 tablet 3   BINAXNOW COVID-19 AG HOME TEST KIT 8 kits by Does not apply route as needed.     Blood Glucose Monitoring Suppl (ONETOUCH VERIO) w/Device KIT Use to check blood sugar up to 2 times a day 1 kit 0   cyclobenzaprine (FLEXERIL) 10 MG tablet Take 0.5-1 tablets (5-10 mg total) by mouth at bedtime as needed for muscle spasms. 20 tablet 0   desloratadine (CLARINEX) 5 MG tablet Take 1 tablet (5 mg total) by mouth daily. 90 tablet 1   EPINEPHrine 0.3 mg/0.3 mL IJ SOAJ injection USE AS DIRECTED FOR LIFE THREATENING ALLERGIC REACTIONS 2 each 1   estradiol (ESTRACE) 0.1 MG/GM vaginal cream  Place 1 Applicatorful vaginally 2 (two) times a week. (As Needed)     famotidine (PEPCID) 40 MG tablet Take 1 (ONE) tablet by mouth every evening. 90 tablet 1   fluticasone (FLOVENT HFA) 220 MCG/ACT inhaler Inhale 2 puffs into the lungs 2 (two) times daily. 2 puffs 2 times daily during flare up. 36 g 1   gabapentin (NEURONTIN) 300 MG capsule Take one capsule in the morning, one at lunch and two at bedtime 360 capsule 3   glucose blood (ONETOUCH VERIO) test strip 1 strip 2 (two) times daily     GLYXAMBI 10-5 MG TABS TAKE 1 TABLET BY MOUTH DAILY 30 tablet 5   hydrOXYzine (ATARAX/VISTARIL) 10 MG tablet Take 1 tablet (10 mg total) by mouth daily as needed for itching. 30 tablet 2   ipratropium (ATROVENT) 0.06 % nasal spray Place 2 sprays into both nostrils 2 (two) times daily as needed for rhinitis. 1-2 sprays in each nostril 1-2 times per day as needed to dry nose 45 mL 1   irbesartan (AVAPRO) 150 MG tablet TAKE 1 TABLET(150 MG) BY MOUTH DAILY 90 tablet 2   Lactase (DAIRY-RELIEF PO) Take 1 capsule by mouth daily as needed (eating dairy).      Lancets [facility] DELICA PLUS  LANCET33G) MISC USE TO CHECK BLOOD SUGAR UP TO TWICE DAILY 100 each 5   levalbuterol (XOPENEX) 1.25 MG/3ML nebulizer solution USE 1 VIAL VIA NEBULIZER EVERY 6 HOURS AS NEEDED FOR SHORTNESS OF BREATH OR WHEEZING 90 mL 3   Magnesium 500 MG TABS Take 500 mg by mouth daily.     Menthol, Topical Analgesic, (ABSORBINE PLUS JR EX) Apply 1 patch topically daily as needed (pain).     Menthol-Methyl Salicylate (SALONPAS JET SPRAY EX) Apply 1 spray topically daily as needed (knee pain).     methimazole (TAPAZOLE) 5 MG tablet Take 1 tablet (5 mg total) by mouth every other day. 45 tablet 3   montelukast (SINGULAIR) 10 MG tablet Take 1 tablet (10 mg total) by mouth at bedtime. For asthma control. 90 tablet 1   Mouthwashes (BIOTENE DRY MOUTH MT) Use as directed 1 Dose in the mouth or throat daily as needed (dry mouth).     ondansetron (ZOFRAN-ODT)  8 MG disintegrating tablet Take 1 tablet (8 mg total) by mouth every 8 (eight) hours as needed for nausea or vomiting. 20 tablet 0   pantoprazole (PROTONIX) 40 MG tablet Take 1 (ONE) tablet by mouth every morning. 90 tablet 1   Plecanatide (TRULANCE) 3 MG TABS Take 3 mg by mouth daily as needed (constipation).     Polyethyl Glycol-Propyl Glycol (SYSTANE OP) Place 1 drop into both eyes daily as needed (dry eyes).     polyethylene glycol powder (GLYCOLAX/MIRALAX) 17 GM/SCOOP powder Take 1 Container by mouth once.     polyethylene glycol-electrolytes (NULYTELY) 420 g solution Take 4,000 mLs by mouth once.     PRESCRIPTION MEDICATION Inhale into the lungs at bedtime. CPAP     Respiratory Therapy Supplies (NEBULIZER MASK ADULT) MISC 1 kit by Does not apply route as directed. 1 each 1   Tiotropium Bromide Monohydrate (SPIRIVA RESPIMAT) 1.25 MCG/ACT AERS Inhale 1 Inhalation into the lungs daily. 4 g 5   tolterodine (DETROL LA) 4 MG 24 hr capsule Take 4 mg by mouth at bedtime.     triamcinolone (NASACORT) 55 MCG/ACT AERO nasal inhaler Place 2 sprays into the nose daily. 50.7 mL 1   XARELTO 20 MG TABS tablet TAKE 1 TABLET(20 MG) BY MOUTH DAILY WITH SUPPER 90 tablet 1   CALCIUM PO Take 1,000 mg by mouth daily. (Patient not taking: Reported on 01/23/2023)     Current Facility-Administered Medications  Medication Dose Route Frequency Provider Last Rate Last Admin   omalizumab Geoffry Paradise) injection 300 mg  300 mg Subcutaneous Q28 days Jessica Priest, MD   300 mg at 12/25/22 0921   tezepelumab-ekko (TEZSPIRE) 210 MG/1. syringe 210 mg  210 mg Subcutaneous Q28 days Jessica Priest, MD   210 mg at 01/15/23 1136     Review of Systems    She denies chest pain, palpitations, dyspnea, pnd, orthopnea, n, v, dizziness, syncope, edema, weight gain, or early satiety. All other systems reviewed and are otherwise negative except as noted above.   Physical Exam    VS:  BP 120/68   Pulse 74   Ht 5\' 4"  (1.626 m)   Wt  203 lb 3.2 oz (92.2 kg)   SpO2 94%   BMI 34.88 kg/m  GEN: Well nourished, well developed, in no acute distress. HEENT: normal. Neck: Supple, no JVD, carotid bruits, or masses. Cardiac: RRR, no murmurs, rubs, or gallops. No clubbing, cyanosis, edema.  Radials/DP/PT 2+ and equal bilaterally.  Respiratory:  Respirations regular and unlabored, clear  to auscultation bilaterally. GI: Soft, nontender, nondistended, BS + x 4. MS: no deformity or atrophy. Skin: warm and dry, no rash. Neuro:  Strength and sensation are intact. Psych: Normal affect.  Accessory Clinical Findings    ECG personally reviewed by me today - EKG Interpretation Date/Time:  Wednesday January 23 2023 15:47:59 EDT Ventricular Rate:  74 PR Interval:    QRS Duration:  88 QT Interval:  402 QTC Calculation: 446 R Axis:   -40  Text Interpretation: Atrial flutter Left axis deviation Pulmonary disease pattern Minimal voltage criteria for LVH, may be normal variant ( Cornell product ) Septal infarct , age undetermined When compared with ECG of 18-Dec-2021 19:24, PREVIOUS ECG IS PRESENT Confirmed by Bernadene Person ([id number]) on 01/23/2023 4:23:05 PM  - no acute changes.   Lab Results  Component Value Date   WBC 6.5 02/28/2022   HGB 12.2 02/28/2022   HCT 36.7 02/28/2022   MCV 74.6 (L) 02/28/2022   PLT 253 02/28/2022   Lab Results  Component Value Date   CREATININE 1.04 04/24/2022   BUN 13 04/24/2022   NA 140 04/24/2022   K 4.2 04/24/2022   CL 106 04/24/2022   CO2 26 04/24/2022   Lab Results  Component Value Date   ALT 9 04/24/2022   AST 17 04/24/2022   ALKPHOS 88 04/24/2022   BILITOT 0.8 04/24/2022   Lab Results  Component Value Date   CHOL 106 04/24/2022   HDL 56.40 04/24/2022   LDLCALC 40 04/24/2022   TRIG 49.0 04/24/2022   CHOLHDL 2 04/24/2022    Lab Results  Component Value Date   HGBA1C 5.6 03/22/2022    Assessment & Plan    1. Atypical atrial flutter/paroxysmal atrial fibrillation: S/p DCCV in 11/2020.  EKG shows rate controlled atrial flutter.  She is asymptomatic, euvolemic and well compensated on exam.  She notes she missed a dose of her Xarelto last week.  Ideally, would restore sinus rhythm.  Encourage adherence to Xarelto.  Will bring her back in 3 weeks.  If she remains in atrial flutter, can consider DCCV.  Reviewed ED precautions. Continue Xarelto.  2. Chronic diastolic heart failure: Echocardiogram in April 2022 showed EF 60 to 65%, moderate asymmetric LVH of the basal-septal segment, indeterminate diastolic parameters mildly elevated PASP. Euvolemic and well compensated on exam.  Previously on Lasix as needed, she is no longer taking this-no indication at this time.  3. Hypertension: BP well controlled. Continue current antihypertensive regimen.   4. Hyperlipidemia: LDL was 49 in October 2023.  Continue Lipitor.  5. Pulmonary hypertension: Thought to be multifactorial related to diastolic dysfunction, previous pulmonary embolism, longstanding obesity. Mild most recent echo, with estimated PASP of 44 mmHg.  Asymptomatic.  6. H/O DVT/PE: No recurrence.  She is on chronic anticoagulation given history of atrial arrhythmias.   7. Type 2 diabetes: A1C was 5.6 in October 2023.  Monitored and managed per PCP.   8. OSA: On CPAP.  She has follow-up scheduled with neurology to inquire about a new mask.   9. Chronic asthma: Avoid carvedilol around the nonselective beta-blockers.  Follows with Dr. Lucie Leather.   10. Hyperthyroidism: Controlled on chronic methimazole.  TSH was 2.96 in 06/2022.  Follows with endocrinology.  11. Disposition: Follow-up in 3 weeks.      Joylene Grapes, NP 01/23/2023, 5:07 PM

## 2023-01-23 NOTE — Assessment & Plan Note (Signed)
Chronic, intermittent. Recommended increased water gentle stretching and elevation as needed.

## 2023-01-23 NOTE — Patient Instructions (Signed)
Medication Instructions:  Your physician recommends that you continue on your current medications as directed. Please refer to the Current Medication list given to you today.  *If you need a refill on your cardiac medications before your next appointment, please call your pharmacy*   Lab Work: NONE ordered at this time of appointment    Testing/Procedures: NONE ordered at this time of appointment     Follow-Up: At [facility], you and your health needs are our priority.  As part of our continuing mission to provide you with exceptional heart care, we have created designated Provider Care Teams.  These Care Teams include your primary Cardiologist (physician) and Advanced Practice Providers (APPs -  Physician Assistants and Nurse Practitioners) who all work together to provide you with the care you need, when you need it.  We recommend signing up for the patient portal called "MyChart".  Sign up information is provided on this After Visit Summary.  MyChart is used to connect with patients for Virtual Visits (Telemedicine).  Patients are able to view lab/test results, encounter notes, upcoming appointments, etc.  Non-urgent messages can be sent to your provider as well.   To learn more about what you can do with MyChart, go to [email or web address].    Your next appointment:   3 week(s)  Provider:   Bernadene Person, NP

## 2023-01-24 ENCOUNTER — Telehealth: Payer: Self-pay

## 2023-01-24 ENCOUNTER — Inpatient Hospital Stay: Payer: Medicare Other | Admitting: Adult Health

## 2023-01-24 ENCOUNTER — Telehealth: Payer: Self-pay | Admitting: Family Medicine

## 2023-01-24 NOTE — Telephone Encounter (Signed)
Spoke with [name].  She ask if she needs to soak her foot every time she changes her bandage.  I advised she doesn't need to soak her foot but to just clean with warm soapy water, and apply antibiotic ointment with dressing changes.  Patient states understanding.

## 2023-01-24 NOTE — Telephone Encounter (Signed)
Patient called in and wanted a call from [place]. She stated that she had some questions regarding her toe. Thank you!

## 2023-01-24 NOTE — Telephone Encounter (Signed)
Retunred Pt's call regarding today's appt. Pt states she will need to reschedule as her transportation fell through and she is unable to drive herself as "my toe is still bleeding". Pt was previously seen in ER for bleeding. Appt rescheduled for 01/29/23. Pt verbalized understanding.

## 2023-01-28 ENCOUNTER — Telehealth: Payer: Self-pay

## 2023-01-28 NOTE — Telephone Encounter (Signed)
Transition Care Management Follow-up Telephone Call Date of discharge and from where: 01/22/2023 [facility] How have you been since you were released from the hospital? Patient is feeling better and right toe laceration is healing. Any questions or concerns? No  Items Reviewed: Did the pt receive and understand the discharge instructions provided? Yes  Medications obtained and verified? Yes  Other? No  Any new allergies since your discharge? No  Dietary orders reviewed? Yes Do you have support at home? Yes   Follow up appointments reviewed:  PCP Hospital f/u appt confirmed? Yes  Scheduled to see Alicia Nora, MD on 01/23/2023 @ [facility] [place]. [facility] f/u appt confirmed? Yes  Scheduled to see Alicia Person, NP on 01/23/2023 @ [facility]. Are transportation arrangements needed? No  If their condition worsens, is the pt aware to call PCP or go to the Emergency Dept.? Yes Was the patient provided with contact information for the PCP's office or ED? Yes Was to pt encouraged to call back with questions or concerns? Yes  [name] [name]  [facility] Population [facility] Community Resource Care Guide   ??[name]  ?? [phone]   Website: [email or web address]  [email or web address]

## 2023-01-29 ENCOUNTER — Other Ambulatory Visit (INDEPENDENT_AMBULATORY_CARE_PROVIDER_SITE_OTHER): Payer: Medicare Other

## 2023-01-29 ENCOUNTER — Other Ambulatory Visit: Payer: Self-pay

## 2023-01-29 ENCOUNTER — Inpatient Hospital Stay: Payer: Medicare Other | Attending: Adult Health | Admitting: Adult Health

## 2023-01-29 ENCOUNTER — Encounter: Payer: Self-pay | Admitting: Adult Health

## 2023-01-29 VITALS — BP 153/85 | HR 95 | Temp 97.5°F | Resp 16 | Wt 201.3 lb

## 2023-01-29 DIAGNOSIS — Z800 Family history of malignant neoplasm of digestive organs: Secondary | ICD-10-CM | POA: Diagnosis not present

## 2023-01-29 DIAGNOSIS — R252 Cramp and spasm: Secondary | ICD-10-CM | POA: Insufficient documentation

## 2023-01-29 DIAGNOSIS — E0500 Thyrotoxicosis with diffuse goiter without thyrotoxic crisis or storm: Secondary | ICD-10-CM

## 2023-01-29 DIAGNOSIS — D0512 Intraductal carcinoma in situ of left breast: Secondary | ICD-10-CM | POA: Insufficient documentation

## 2023-01-29 DIAGNOSIS — Z79811 Long term (current) use of aromatase inhibitors: Secondary | ICD-10-CM | POA: Diagnosis not present

## 2023-01-29 DIAGNOSIS — Z803 Family history of malignant neoplasm of breast: Secondary | ICD-10-CM | POA: Diagnosis not present

## 2023-01-29 DIAGNOSIS — Z923 Personal history of irradiation: Secondary | ICD-10-CM | POA: Insufficient documentation

## 2023-01-29 DIAGNOSIS — Z8042 Family history of malignant neoplasm of prostate: Secondary | ICD-10-CM | POA: Insufficient documentation

## 2023-01-29 DIAGNOSIS — Z90710 Acquired absence of both cervix and uterus: Secondary | ICD-10-CM | POA: Insufficient documentation

## 2023-01-29 LAB — TSH: TSH: 3.83 u[IU]/mL (ref 0.35–5.50)

## 2023-01-29 LAB — T3, FREE: T3, Free: 3.2 pg/mL (ref 2.3–4.2)

## 2023-01-29 LAB — T4, FREE: Free T4: 1.19 ng/dL (ref 0.60–1.60)

## 2023-01-29 MED ORDER — METHIMAZOLE 5 MG PO TABS: 5.0000 mg | ORAL_TABLET | ORAL | Status: DC

## 2023-01-29 NOTE — Assessment & Plan Note (Signed)
02/19/2022: Screening mammogram detected left breast asymmetry 1.1 cm oval mass on biopsy revealed intermediate grade DCIS ER 80% weak, PR negative 03/29/2022: Left lumpectomy: Intermediate grade DCIS, margins negative, 1 cm size, ER 80% weak, PR 0% 05/30/2022: Completed adjuvant radiation   Current treatment: Antiestrogen therapy with anastrozole Anastrozole Toxicities: Tolerating it extremely well without any problems or concerns.  She does have leg cramps but these have been there even before starting anastrozole therapy.  Breast Cancer Surveillance: Mammogram: 07/03/22: Benign, next mammogram scheduled in December 2024 Breast Exam 01/29/23: Benign  Bone health: Her most recent bone density in December 2023 was normal.  Repeat is recommended in December 2025.  Abnormal labs from rheumatology: I cannot interpret these accurately without full lab results. I asked my nurse to obtain these results from [facility].    RTC in 1 year for f/u with Dr. Pamelia Hoit.

## 2023-01-29 NOTE — Addendum Note (Signed)
Addended by: Carlus Pavlov on: 01/29/2023 02:29 PM   Modules accepted: Orders

## 2023-01-29 NOTE — Progress Notes (Signed)
[facility] Cancer Follow up:    Excell Seltzer, MD [address] [place] [address]   DIAGNOSIS:  Cancer Staging  Ductal carcinoma in situ (DCIS) of left breast Staging form: Breast, AJCC 8th Edition - Clinical stage from 02/28/2022: Stage 0 (cTis (DCIS), cN0, cM0) - Unsigned Stage prefix: Initial diagnosis Nuclear grade: G2   SUMMARY OF ONCOLOGIC HISTORY: Oncology History  Ductal carcinoma in situ (DCIS) of left breast  02/19/2022 Initial Diagnosis   Screening mammogram detected left breast asymmetry 1.1 cm oval mass on biopsy revealed intermediate grade DCIS ER 80% weak, PR negative    Genetic Testing   [facility] CustomNext Panel was Negative. Report date is 03/13/2022.  The CustomNext-Cancer+RNAinsight panel offered by [employer] includes sequencing and rearrangement analysis for the following 47 genes:  APC, ATM, AXIN2, BARD1, BMPR1A, BRCA1, BRCA2, BRIP1, CDH1, CDK4, CDKN2A, CHEK2, CTNNA1, DICER1, EPCAM, GREM1, HOXB13, KIT, MEN1, MLH1, MSH2, MSH3, MSH6, MUTYH, NBN, NF1, NTHL1, PALB2, PDGFRA, PMS2, POLD1, POLE, PTEN, RAD50, RAD51C, RAD51D, SDHA, SDHB, SDHC, SDHD, SMAD4, SMARCA4, STK11, TP53, TSC1, TSC2, and VHL.  RNA data is routinely analyzed for use in variant interpretation for all genes.   03/29/2022 Surgery   Left lumpectomy: Intermediate grade DCIS, margins negative, 1 cm size, ER 80% weak, PR 0%   05/02/2022 - 05/30/2022 Radiation Therapy   Site Technique Total Dose (Gy) Dose per Fx (Gy) Completed Fx Beam Energies  Breast, Left: Breast_L 3D 40.05/40.05 2.67 15/15 6XFFF  Breast, Left: Breast_L_Bst 3D 12/12 2 6/6 6X     08/2022 -  Anti-estrogen oral therapy   Anastrozole     CURRENT THERAPY: Anastrozole  INTERVAL HISTORY: [name] 77 y.o. female returns for f/u of her estrogen positive breast cancer.  She continues on Anastrozole daily and notes no increase in hot flashes, vaginal dryness, or other concerns since starting it.  Her most  recent bone density testinwas normal in 06/2022 at [facility] and her mammogram is scheduled in December at [facility].    She underwent lab testing at [facility] that showed some elevated kappa light chains.  Unfortunately the lab copies were not scanned into our system, just the office visit.  Patient Active Problem List   Diagnosis Date Noted   Laceration of right great toe without foreign body present or damage to nail 01/23/2023   Itching 09/04/2022   Sprain of right ankle 07/02/2022   Lesion of tongue 07/02/2022   SSS (sick sinus syndrome) (HCC) 04/26/2022   Family history of breast cancer 02/28/2022   Family history of colon cancer in mother 02/28/2022   Ductal carcinoma in situ (DCIS) of left breast 02/23/2022   Family history of malignant neoplasm of digestive organs 02/02/2022   Excessive flatus 02/02/2022   Anemia 12/29/2021   Chronic constipation 12/29/2021   Chronic anticoagulation - Xerelto 12/18/2021   Hyperthyroidism 12/18/2021   Status post total left knee replacement 12/09/2021   Presence of right artificial knee joint 10/30/2021   Constipation by delayed colonic transit 09/25/2021   Atypical atrial flutter (HCC) 06/24/2021   Chronic diastolic heart failure (HCC) 06/24/2021   Stenosing tenosynovitis of finger of right hand 09/06/2020   Type 2 diabetes mellitus with diabetic neuropathy, unspecified (HCC) 06/20/2020   Dysphonia 04/08/2020   Discitis of lumbosacral region 01/04/2020   Hyperlipidemia associated with type 2 diabetes mellitus (HCC) 05/07/2019   Obstructive sleep apnea treated with continuous positive airway pressure (CPAP) 06/03/2018   Left thyroid nodule 02/06/2018  Insomnia secondary to chronic pain 01/26/2018   Class 3 drug-induced obesity with serious comorbidity and body mass index (BMI) of 40.0 to 44.9 in adult (HCC) 12/03/2017   Graves disease 10/15/2017   Acute diarrhea 01/04/2017   Bilateral high frequency sensorineural hearing loss  10/24/2016   Subjective tinnitus, bilateral 10/24/2016   Other fatigue 12/09/2015   Leg cramps 08/23/2015   Moderate persistent asthma 07/12/2015   Paroxysmal atrial fibrillation (HCC) 06/03/2015   Neuropathy due to secondary diabetes (HCC) 01/11/2015   Migraine headache without aura 08/24/2014   Allergic rhinitis 03/25/2014   DVT (deep venous thrombosis) hx of  03/25/2014   Osteoarthritis of right knee 03/25/2014    ? of Seizure disorder 03/25/2014   Depression, major, in remission (HCC) 03/25/2014   Hypertension associated with diabetes (HCC) 03/25/2014   GERD (gastroesophageal reflux disease) 03/25/2014   History of DVT (deep vein thrombosis) 03/25/2014   History of pulmonary embolism 10/09/2013   Hypercoagulable state (HCC) 01/29/2011   Asthma, moderate persistent 01/24/2011   Pulmonary hypertension (HCC) 01/11/2011    is allergic to dilantin [phenytoin], latex, oxycodone, penicillins, codeine, hydrocodone, nickel, and pregabalin.  MEDICAL HISTORY: Past Medical History:  Diagnosis Date   Allergic rhinitis    Allergy    Arthritis    Asthma    Breast cancer (HCC)    Chronic headache    Diabetes mellitus    Ductal carcinoma in situ (DCIS) of left breast 02/23/2022   DVT (deep venous thrombosis) (HCC)    Dyspnea    Heart murmur    History of radiation therapy    Left Breast 05/02/22-05/30/22- Dr. Antony Blackbird   Hypertension    Penicillin allergy 01/19/2020   Pulmonary embolism (HCC)    Pulmonary hypertension (HCC)    Seizures (HCC)     SURGICAL HISTORY: Past Surgical History:  Procedure Laterality Date   ABDOMINAL HYSTERECTOMY     partial, has ovaries   BREAST LUMPECTOMY WITH RADIOACTIVE SEED LOCALIZATION Left 03/29/2022   Procedure: LEFT BREAST LUMPECTOMY WITH RADIOACTIVE SEED LOCALIZATION;  Surgeon: Harriette Bouillon, MD;  Location: [facility] OR;  Service: General;  Laterality: Left;   CARDIAC CATHETERIZATION  12/28/2010   Mod. pulmonary hypertension, normal coronary  arteries   CARDIOVERSION N/A 11/23/2020   Procedure: CARDIOVERSION;  Surgeon: Thurmon Fair, MD;  Location: [facility];  Service: Cardiovascular;  Laterality: N/A;   CHOLECYSTECTOMY N/A 12/20/2021   Procedure: LAPAROSCOPIC CHOLECYSTECTOMY WITH INTRAOPERATIVE CHOLANGIOGRAM;  Surgeon: Darnell Level, MD;  Location: [facility] ORS;  Service: General;  Laterality: N/A;   DOPPLER ECHOCARDIOGRAPHY  10/08/2011   EF=>55%,mild asymmetric LVH, mod. TR, mod. PH, mild to mod LA dilatation   IR LUMBAR DISC ASPIRATION W/IMG GUIDE  01/12/2020   KNEE ARTHROSCOPY Left    KNEE SURGERY     Nuclear Stress Test  05/20/2006   No ischemia   PARTIAL HYSTERECTOMY     PLANTAR FASCIA SURGERY     TONSILLECTOMY      SOCIAL HISTORY: Social History   Socioeconomic History   Marital status: Widowed    Spouse name: Not on file   Number of children: Y   Years of education: Not on file   Highest education level: Not on file  Occupational History   Occupation: retired Programme researcher, broadcasting/film/video.   Tobacco Use   Smoking status: Never   Smokeless tobacco: Never  Vaping Use   Vaping status: Never Used  Substance and Sexual Activity   Alcohol use: Not Currently    Alcohol/week: 0.0 standard drinks of  alcohol    Comment: occ glass on wine   Drug use: No   Sexual activity: Never  Other Topics Concern   Not on file  Social History Narrative   Widow    limited exercise.   Social Determinants of Health   Financial Resource Strain: Low Risk  (09/12/2022)   Overall Financial Resource Strain (CARDIA)    Difficulty of Paying Living Expenses: Not hard at all  Food Insecurity: No Food Insecurity (09/12/2022)   Hunger Vital Sign    Worried About Running Out of Food in the Last Year: Never true    Ran Out of Food in the Last Year: Never true  Transportation Needs: No Transportation Needs (09/12/2022)   PRAPARE - Administrator, Civil Service (Medical): No    Lack of Transportation (Non-Medical): No  Physical Activity: Not on  file  Stress: Not on file  Social Connections: Unknown (11/27/2021)   Received from [facility]   Social Network    Social Network: Not on file  Intimate Partner Violence: Unknown (10/19/2021)   Received from [facility]   HITS    Physically Hurt: Not on file    Insult or Talk Down To: Not on file    Threaten Physical Harm: Not on file    Scream or Curse: Not on file    FAMILY HISTORY: Family History  Problem Relation Age of Onset   Cancer Mother        breast and colon cancer   Hypertension Mother    Clotting disorder Mother    Breast cancer Mother 75   Arthritis Mother    Stroke Mother    Diabetes Mother    Colon cancer Mother 82 - 3   Alcohol abuse Father    Cancer Sister        breast cancer   Multiple sclerosis Sister    Cancer Sister        colon cancer   Cancer Brother        colon cancer   Prostate cancer Half-Brother    Stomach cancer Half-Brother    Breast cancer Half-Sister 55       recurrence at 39, reports positive genetic testing   Arthritis Half-Sister    Diabetes Half-Sister    Colon cancer Half-Sister 64   Allergies Other        grandson   Allergic rhinitis Neg Hx    Angioedema Neg Hx    Asthma Neg Hx    Atopy Neg Hx    Eczema Neg Hx    Immunodeficiency Neg Hx    Urticaria Neg Hx     Review of Systems  Constitutional:  Negative for appetite change, chills, fatigue, fever and unexpected weight change.  HENT:   Positive for voice change (sees endocrinology about thyromegaly, has mouth dryness from allergy medications). Negative for hearing loss, lump/mass, mouth sores, sore throat and trouble swallowing.   Eyes:  Negative for eye problems and icterus.  Respiratory:  Negative for chest tightness, cough and shortness of breath.   Cardiovascular:  Negative for chest pain, leg swelling and palpitations.  Gastrointestinal:  Negative for abdominal distention, abdominal pain, constipation, diarrhea, nausea and vomiting.  Endocrine: Negative for  hot flashes.  Genitourinary:  Negative for difficulty urinating.   Musculoskeletal:  Positive for arthralgias (followed by rheumatology).  Skin:  Negative for itching and rash.  Neurological:  Negative for dizziness, extremity weakness, headaches and numbness.  Hematological:  Negative for adenopathy. Does not bruise/bleed  easily.  Psychiatric/Behavioral:  Negative for depression. The patient is not nervous/anxious.       PHYSICAL EXAMINATION   Onc Performance Status - 01/29/23 1145       ECOG Perf Status   ECOG Perf Status Restricted in physically strenuous activity but ambulatory and able to carry out work of a light or sedentary nature, e.g., light house work, office work      KPS SCALE   KPS % SCORE Able to carry on normal activity, minor s/s of disease             Vitals:   01/29/23 1138  BP: (!) 153/85  Pulse: 95  Resp: 16  Temp: (!) 97.5 F (36.4 C)  SpO2: 96%    Physical Exam Constitutional:      General: She is not in acute distress.    Appearance: Normal appearance. She is not toxic-appearing.  HENT:     Head: Normocephalic and atraumatic.     Mouth/Throat:     Mouth: Mucous membranes are moist.     Pharynx: Oropharynx is clear. No oropharyngeal exudate or posterior oropharyngeal erythema.  Eyes:     General: No scleral icterus. Cardiovascular:     Rate and Rhythm: Normal rate and regular rhythm.     Pulses: Normal pulses.     Heart sounds: Normal heart sounds.  Pulmonary:     Effort: Pulmonary effort is normal.     Breath sounds: Normal breath sounds.  Chest:     Comments: Left breast status postlumpectomy and radiation no sign of local recurrence right breast is benign. Abdominal:     General: Abdomen is flat. Bowel sounds are normal. There is no distension.     Palpations: Abdomen is soft.     Tenderness: There is no abdominal tenderness.  Musculoskeletal:        General: No swelling.     Cervical back: Neck supple.  Lymphadenopathy:      Cervical: No cervical adenopathy.  Skin:    General: Skin is warm and dry.     Findings: No rash.  Neurological:     General: No focal deficit present.     Mental Status: She is alert.  Psychiatric:        Mood and Affect: Mood normal.        Behavior: Behavior normal.     LABORATORY DATA:  CBC    Component Value Date/Time   WBC 6.5 02/28/2022 0824   WBC 6.9 12/29/2021 1103   RBC 4.92 02/28/2022 0824   HGB 12.2 02/28/2022 0824   HGB 13.4 11/21/2020 1026   HGB 12.9 02/01/2011 1106   HCT 36.7 02/28/2022 0824   HCT 41.1 11/21/2020 1026   HCT 38.8 02/01/2011 1106   PLT 253 02/28/2022 0824   PLT 276 11/21/2020 1026   MCV 74.6 (L) 02/28/2022 0824   MCV 79 11/21/2020 1026   MCV 80.2 02/01/2011 1106   MCH 24.8 (L) 02/28/2022 0824   MCHC 33.2 02/28/2022 0824   RDW 16.9 (H) 02/28/2022 0824   RDW 15.9 (H) 11/21/2020 1026   RDW 15.4 (H) 02/01/2011 1106   LYMPHSABS 3.0 02/28/2022 0824   LYMPHSABS 1.9 02/01/2011 1106   MONOABS 0.7 02/28/2022 0824   MONOABS 0.6 02/01/2011 1106   EOSABS 0.1 02/28/2022 0824   EOSABS 0.1 02/01/2011 1106   BASOSABS 0.1 02/28/2022 0824   BASOSABS 0.0 02/01/2011 1106    CMP     Component Value Date/Time   NA 140 04/24/2022 1003  NA 140 11/21/2020 1026   K 4.2 04/24/2022 1003   CL 106 04/24/2022 1003   CO2 26 04/24/2022 1003   GLUCOSE 91 04/24/2022 1003   BUN 13 04/24/2022 1003   BUN 16 11/21/2020 1026   CREATININE 1.04 04/24/2022 1003   CREATININE 1.03 (H) 02/28/2022 0824   CREATININE 1.01 (H) 12/30/2020 1635   CALCIUM 9.3 04/24/2022 1003   PROT 7.1 04/24/2022 1003   ALBUMIN 4.0 04/24/2022 1003   AST 17 04/24/2022 1003   AST 16 02/28/2022 0824   ALT 9 04/24/2022 1003   ALT 10 02/28/2022 0824   ALKPHOS 88 04/24/2022 1003   BILITOT 0.8 04/24/2022 1003   BILITOT 0.8 02/28/2022 0824   GFRNONAA 57 (L) 02/28/2022 0824   GFRNONAA 53 (L) 01/04/2020 1059   GFRAA 62 01/04/2020 1059        ASSESSMENT and THERAPY PLAN:   Ductal  carcinoma in situ (DCIS) of left breast 02/19/2022: Screening mammogram detected left breast asymmetry 1.1 cm oval mass on biopsy revealed intermediate grade DCIS ER 80% weak, PR negative 03/29/2022: Left lumpectomy: Intermediate grade DCIS, margins negative, 1 cm size, ER 80% weak, PR 0% 05/30/2022: Completed adjuvant radiation   Current treatment: Antiestrogen therapy with anastrozole Anastrozole Toxicities: Tolerating it extremely well without any problems or concerns.  She does have leg cramps but these have been there even before starting anastrozole therapy.  Breast Cancer Surveillance: Mammogram: 07/03/22: Benign, next mammogram scheduled in December 2024 Breast Exam 01/29/23: Benign  Bone health: Her most recent bone density in December 2023 was normal.  Repeat is recommended in December 2025.  Abnormal labs from rheumatology: I cannot interpret these accurately without full lab results. I asked my nurse to obtain these results from [facility].    RTC in 1 year for f/u with Dr. Pamelia Hoit.    All questions were answered. The patient knows to call the clinic with any problems, questions or concerns. We can certainly see the patient much sooner if necessary.  Total encounter time:30 minutes*in face-to-face visit time, chart review, lab review, care coordination, order entry, and documentation of the encounter time.    Lillard Anes, NP 01/29/23 12:23 PM Medical Oncology and Hematology [facility] [address] [place], [place] [address] Tel. [phone]    Fax. [phone]  *Total Encounter Time as defined by the Centers for Medicare and Medicaid Services includes, in addition to the face-to-face time of a patient visit (documented in the note above) non-face-to-face time: obtaining and reviewing outside history, ordering and reviewing medications, tests or procedures, care coordination (communications with other health care professionals or caregivers) and  documentation in the medical record.

## 2023-01-30 ENCOUNTER — Encounter: Payer: Self-pay | Admitting: Rheumatology

## 2023-01-31 ENCOUNTER — Encounter: Payer: Self-pay | Admitting: Rheumatology

## 2023-02-01 ENCOUNTER — Telehealth: Payer: Self-pay

## 2023-02-01 NOTE — Telephone Encounter (Signed)
Pt has been notified and voices understanding, Orders are already in her chart.

## 2023-02-01 NOTE — Telephone Encounter (Signed)
Copied and Paste from labs:Thyroid tests are normal but TSH is higher in the target range.  We can reduce the dose of methimazole to 1 tablet 2x a week.  Let's repeat the tests in 1.5 months after this change.

## 2023-02-04 ENCOUNTER — Ambulatory Visit: Payer: Medicare Other | Admitting: Family

## 2023-02-04 ENCOUNTER — Telehealth: Payer: Self-pay | Admitting: Family Medicine

## 2023-02-04 ENCOUNTER — Encounter: Payer: Self-pay | Admitting: Family

## 2023-02-04 ENCOUNTER — Other Ambulatory Visit: Payer: Self-pay | Admitting: Allergy and Immunology

## 2023-02-04 VITALS — BP 132/78 | HR 59 | Temp 98.0°F | Ht 64.0 in | Wt 203.0 lb

## 2023-02-04 DIAGNOSIS — L03031 Cellulitis of right toe: Secondary | ICD-10-CM | POA: Insufficient documentation

## 2023-02-04 DIAGNOSIS — S91111A Laceration without foreign body of right great toe without damage to nail, initial encounter: Secondary | ICD-10-CM | POA: Diagnosis not present

## 2023-02-04 MED ORDER — DOXYCYCLINE HYCLATE 100 MG PO TABS
100.0000 mg | ORAL_TABLET | Freq: Two times a day (BID) | ORAL | 0 refills | Status: AC
Start: 2023-02-04 — End: 2023-02-14

## 2023-02-04 NOTE — Telephone Encounter (Signed)
Patient called in and stated that she seen Tabitha regarding her toe. She stated that the toe Tabitha wrapped up today the bandage came off. She was wanting to know if she could apply neosporin to the toe that Tabitha looked at today. Thank you!

## 2023-02-04 NOTE — Assessment & Plan Note (Signed)
New, not improving.  Worry for cellulitis.  Rx RX doxycycline 100 mg po bid x 10 days Warm soaks daily, change dressing daily.  Pt advised to:  Please monitor site for worsening signs/symptoms of infection to include: increasing redness, increasing tenderness, increase in size, and or pustulant drainage from site. If this is to occur please let me know immediately.

## 2023-02-04 NOTE — Progress Notes (Signed)
Established Patient Office Visit  Subjective:   Patient ID: [name], female    DOB: October 05, 1945  Age: 77 y.o. MRN: [id number]  CC:  Chief Complaint  Patient presents with   Foot Swelling    Right foot and toe swelling 07/09 went to ED from cutting her toe and would not stop bleeding. She has been changing her bandage daily and has noticed her foot is starting to swell.     HPI: KIMBLE DELAURENTIS is a 77 y.o. female presenting on 02/04/2023 for Foot Swelling (Right foot and toe swelling/07/09 went to ED from cutting her toe and would not stop bleeding. She has been changing her bandage daily and has noticed her foot is starting to swell. )  Went to ER 7/9 from cutting her toe with a razor , and it would not stop bleeding due to being on xarelto for afib. She then saw Dr. Ermalene Searing 7/10 and was advised to apply topical antbx and keeping bandage on foot. Se was also instructed to soak her foot daily as well with dressing changes.   She states yesterday while changing her dressing she noticed a bump on the 2nd metatarsal and she thought it had some blood inside of it.        ROS: Negative unless specifically indicated above in HPI.   Relevant past medical history reviewed and updated as indicated.   Allergies and medications reviewed and updated.   Current Outpatient Medications:    ADVAIR HFA 230-21 MCG/ACT inhaler, Inhale 2 puffs into the lungs 2 (two) times daily. 2 puffs 1-2 times daily depending on disease activity., Disp: 36 g, Rfl: 1   albuterol (PROVENTIL) (2.5 MG/3ML) 0.083% nebulizer solution, Take 3 mLs (2.5 mg total) by nebulization every 6 (six) hours as needed for wheezing or shortness of breath., Disp: 150 mL, Rfl: 1   albuterol (VENTOLIN HFA) 108 (90 Base) MCG/ACT inhaler, INHALE 2 PUFFS BY MOUTH EVERY 6 HOURS AS NEEDED FOR WHEEZING OR SHORTNESS OF BREATH, Disp: 18 g, Rfl: 1   anastrozole (ARIMIDEX) 1 MG tablet, TAKE 1 TABLET(1 MG) BY MOUTH DAILY, Disp: 90 tablet,  Rfl: 3   atorvastatin (LIPITOR) 10 MG tablet, TAKE 1 TABLET(10 MG) BY MOUTH DAILY, Disp: 90 tablet, Rfl: 3   BINAXNOW COVID-19 AG HOME TEST KIT, 8 kits by Does not apply route as needed., Disp: , Rfl:    Blood Glucose Monitoring Suppl (ONETOUCH VERIO) w/Device KIT, Use to check blood sugar up to 2 times a day, Disp: 1 kit, Rfl: 0   CALCIUM PO, Take 1,000 mg by mouth daily., Disp: , Rfl:    cyclobenzaprine (FLEXERIL) 10 MG tablet, Take 0.5-1 tablets (5-10 mg total) by mouth at bedtime as needed for muscle spasms., Disp: 20 tablet, Rfl: 0   doxycycline (VIBRA-TABS) 100 MG tablet, Take 1 tablet (100 mg total) by mouth 2 (two) times daily for 10 days., Disp: 20 tablet, Rfl: 0   EPINEPHrine 0.3 mg/0.3 mL IJ SOAJ injection, USE AS DIRECTED FOR LIFE THREATENING ALLERGIC REACTIONS, Disp: 2 each, Rfl: 1   estradiol (ESTRACE) 0.1 MG/GM vaginal cream, Place 1 Applicatorful vaginally 2 (two) times a week. (As Needed), Disp: , Rfl:    famotidine (PEPCID) 40 MG tablet, Take 1 (ONE) tablet by mouth every evening., Disp: 90 tablet, Rfl: 1   fluticasone (FLOVENT HFA) 220 MCG/ACT inhaler, Inhale 2 puffs into the lungs 2 (two) times daily. 2 puffs 2 times daily during flare up., Disp: 36 g, Rfl: 1  gabapentin (NEURONTIN) 300 MG capsule, Take one capsule in the morning, one at lunch and two at bedtime, Disp: 360 capsule, Rfl: 3   glucose blood (ONETOUCH VERIO) test strip, 1 strip 2 (two) times daily, Disp: , Rfl:    GLYXAMBI 10-5 MG TABS, TAKE 1 TABLET BY MOUTH DAILY, Disp: 30 tablet, Rfl: 5   hydrOXYzine (ATARAX/VISTARIL) 10 MG tablet, Take 1 tablet (10 mg total) by mouth daily as needed for itching., Disp: 30 tablet, Rfl: 2   ipratropium (ATROVENT) 0.06 % nasal spray, Place 2 sprays into both nostrils 2 (two) times daily as needed for rhinitis. 1-2 sprays in each nostril 1-2 times per day as needed to dry nose, Disp: 45 mL, Rfl: 1   irbesartan (AVAPRO) 150 MG tablet, TAKE 1 TABLET(150 MG) BY MOUTH DAILY, Disp: 90  tablet, Rfl: 2   Lactase (DAIRY-RELIEF PO), Take 1 capsule by mouth daily as needed (eating dairy). , Disp: , Rfl:    Lancets (ONETOUCH DELICA PLUS LANCET33G) MISC, USE TO CHECK BLOOD SUGAR UP TO TWICE DAILY, Disp: 100 each, Rfl: 5   levalbuterol (XOPENEX) 1.25 MG/3ML nebulizer solution, USE 1 VIAL VIA NEBULIZER EVERY 6 HOURS AS NEEDED FOR SHORTNESS OF BREATH OR WHEEZING, Disp: 90 mL, Rfl: 3   Magnesium 500 MG TABS, Take 500 mg by mouth daily., Disp: , Rfl:    Menthol, Topical Analgesic, (ABSORBINE PLUS JR EX), Apply 1 patch topically daily as needed (pain)., Disp: , Rfl:    Menthol-Methyl Salicylate (SALONPAS JET SPRAY EX), Apply 1 spray topically daily as needed (knee pain)., Disp: , Rfl:    methimazole (TAPAZOLE) 5 MG tablet, Take 1 tablet (5 mg total) by mouth 2 (two) times a week., Disp: , Rfl:    montelukast (SINGULAIR) 10 MG tablet, Take 1 tablet (10 mg total) by mouth at bedtime. For asthma control., Disp: 90 tablet, Rfl: 1   Mouthwashes (BIOTENE DRY MOUTH MT), Use as directed 1 Dose in the mouth or throat daily as needed (dry mouth)., Disp: , Rfl:    ondansetron (ZOFRAN-ODT) 8 MG disintegrating tablet, Take 1 tablet (8 mg total) by mouth every 8 (eight) hours as needed for nausea or vomiting., Disp: 20 tablet, Rfl: 0   pantoprazole (PROTONIX) 40 MG tablet, Take 1 (ONE) tablet by mouth every morning., Disp: 90 tablet, Rfl: 1   Plecanatide (TRULANCE) 3 MG TABS, Take 3 mg by mouth daily as needed (constipation)., Disp: , Rfl:    Polyethyl Glycol-Propyl Glycol (SYSTANE OP), Place 1 drop into both eyes daily as needed (dry eyes)., Disp: , Rfl:    polyethylene glycol powder (GLYCOLAX/MIRALAX) 17 GM/SCOOP powder, Take 1 Container by mouth once., Disp: , Rfl:    polyethylene glycol-electrolytes (NULYTELY) 420 g solution, Take 4,000 mLs by mouth once., Disp: , Rfl:    PRESCRIPTION MEDICATION, Inhale into the lungs at bedtime. CPAP, Disp: , Rfl:    Respiratory Therapy Supplies (NEBULIZER MASK ADULT)  MISC, 1 kit by Does not apply route as directed., Disp: 1 each, Rfl: 1   Tiotropium Bromide Monohydrate (SPIRIVA RESPIMAT) 1.25 MCG/ACT AERS, Inhale 1 Inhalation into the lungs daily., Disp: 4 g, Rfl: 5   tolterodine (DETROL LA) 4 MG 24 hr capsule, Take 4 mg by mouth at bedtime., Disp: , Rfl:    triamcinolone (NASACORT) 55 MCG/ACT AERO nasal inhaler, Place 2 sprays into the nose daily., Disp: 50.7 mL, Rfl: 1   XARELTO 20 MG TABS tablet, TAKE 1 TABLET(20 MG) BY MOUTH DAILY WITH SUPPER, Disp: 90 tablet,  Rfl: 1   desloratadine (CLARINEX) 5 MG tablet, TAKE 1 TABLET(5 MG) BY MOUTH DAILY, Disp: 90 tablet, Rfl: 1  Current Facility-Administered Medications:    omalizumab Geoffry Paradise) injection 300 mg, 300 mg, Subcutaneous, Q28 days, Kozlow, Alvira Philips, MD, 300 mg at 12/25/22 0921   tezepelumab-ekko (TEZSPIRE) 210 MG/1. syringe 210 mg, 210 mg, Subcutaneous, Q28 days, Kozlow, Alvira Philips, MD, 210 mg at 01/15/23 1136  Allergies  Allergen Reactions   Dilantin [Phenytoin] Swelling    facial swelling   Latex Hives   Oxycodone Nausea And Vomiting and Nausea Only    Abdominal Pain, Vomiting   Penicillins Nausea And Vomiting and Swelling    Has patient had a PCN reaction causing immediate rash, facial/tongue/throat swelling, SOB or lightheadedness with hypotension patient had a PCN reaction causing severe rash involving mucus membranes or skin necrosis: [id number] Has patient had a PCN reaction that required hospitalization/No Has patient had a PCN reaction occurring within the last 10 years: No If all of the above answers are "NO", then may proceed with Cephalosporin use.      Codeine Nausea Only    Tolerates tylenol with codeine   Hydrocodone Nausea And Vomiting   Nickel Hives and Rash   Pregabalin Itching and Other (See Comments)    headaches/problem w/vision    Objective:   BP 132/78   Pulse (!) 59   Temp 98 F (36.7 C) (Temporal)   Ht 5\' 4"  (1.626 m)   Wt 203 lb (92.1 kg)   SpO2 97%   BMI  34.84 kg/m    Physical Exam  Right second toe with right lateral side closed blister with clear to yellow discharge within, erythema to right second metatarsal and also at base of anterior foot between 2nd and third metatarsal, mild edema. Great toe right foot with lateral scabbed over wound with dried yellow discharge. No erythema.        Assessment & Plan:  Cellulitis of right toe Assessment & Plan: New, not improving.  Worry for cellulitis.  Rx RX doxycycline 100 mg po bid x 10 days Warm soaks daily, change dressing daily.  Pt advised to:  Please monitor site for worsening signs/symptoms of infection to include: increasing redness, increasing tenderness, increase in size, and or pustulant drainage from site. If this is to occur please let me know immediately.   Orders: -     WOUND CULTURE -     Doxycycline Hyclate; Take 1 tablet (100 mg total) by mouth 2 (two) times daily for 10 days.  Dispense: 20 tablet; Refill: 0  Laceration of right great toe without foreign body present or damage to nail, initial encounter Assessment & Plan: Worsening.  Pt advised to:  Please monitor site for worsening signs/symptoms of infection to include: increasing redness, increasing tenderness, increase in size, and or pustulant drainage from site. If this is to occur please let me know immediately.        Follow up plan: Return for f/u PCP if no improvement in symptoms.  Mort Sawyers, FNP

## 2023-02-04 NOTE — Assessment & Plan Note (Signed)
Worsening.  Pt advised to:  Please monitor site for worsening signs/symptoms of infection to include: increasing redness, increasing tenderness, increase in size, and or pustulant drainage from site. If this is to occur please let me know immediately.

## 2023-02-05 LAB — WOUND CULTURE

## 2023-02-05 NOTE — Telephone Encounter (Signed)
Yes she can apply antibiotic ointment with dressing changes.

## 2023-02-05 NOTE — Telephone Encounter (Signed)
Left voicemail advising patient of tabithas message. Advised to call back with any further questions.

## 2023-02-06 LAB — WOUND CULTURE
MICRO NUMBER:: 15229495
RESULT:: NO GROWTH
SPECIMEN QUALITY:: ADEQUATE

## 2023-02-07 ENCOUNTER — Ambulatory Visit (INDEPENDENT_AMBULATORY_CARE_PROVIDER_SITE_OTHER): Payer: Medicare Other | Admitting: Family Medicine

## 2023-02-07 ENCOUNTER — Encounter: Payer: Self-pay | Admitting: Family Medicine

## 2023-02-07 VITALS — BP 128/70 | HR 53 | Temp 97.8°F | Resp 16 | Ht 64.0 in | Wt 204.1 lb

## 2023-02-07 DIAGNOSIS — E1140 Type 2 diabetes mellitus with diabetic neuropathy, unspecified: Secondary | ICD-10-CM

## 2023-02-07 DIAGNOSIS — L03031 Cellulitis of right toe: Secondary | ICD-10-CM | POA: Diagnosis not present

## 2023-02-07 DIAGNOSIS — B3731 Acute candidiasis of vulva and vagina: Secondary | ICD-10-CM

## 2023-02-07 MED ORDER — CEFTRIAXONE SODIUM 1 G IJ SOLR
1.0000 g | Freq: Once | INTRAMUSCULAR | Status: AC
Start: 2023-02-07 — End: 2023-02-07
  Administered 2023-02-07: 1 g via INTRAMUSCULAR

## 2023-02-07 MED ORDER — FLUCONAZOLE 150 MG PO TABS: 150.0000 mg | ORAL_TABLET | Freq: Once | ORAL | 0 refills | Status: AC

## 2023-02-07 NOTE — Progress Notes (Signed)
Patient ID: [name], female    DOB: 06/28/1946, 77 y.o.   MRN: [id number]  This visit was conducted in person.  BP 128/70   Pulse (!) 53   Temp 97.8 F (36.6 C)   Resp 16   Ht 5\' 4"  (1.626 m)   Wt 204 lb 2 oz (92.6 kg)   SpO2 98%   BMI 35.04 kg/m    CC:  Chief Complaint  Patient presents with   Cellulitis    Follow up on cellulitis on right toe    Subjective:   HPI: [name] is a 77 y.o. female  with well controlled diabetes presenting on 02/07/2023 for Cellulitis (Follow up on cellulitis on right toe)  Patient was initially seen at ER on July 9 following laceration on right great toe following attempt to remove dead skin with a razor blade.  Patient on Xarelto. She followed up with me on July 10 and healing appeared appropriate.  She noted lack of improvement of wound on right great toe.  She saw Mort Sawyers on July 22 and was diagnosed with cellulitis.  She was started on doxycycline 100 mg twice daily x 10 days.  Wound culture was obtained and has returned with preliminary showing no growth.  Today she reports continued redness and swelling in first two digits on right foot. No spread of redness, but patient does report gradually increasing pain First 2 digits.  She has been compliant with taking the antibiotics without missing doses over the last 2 days.  No side effects.  PCN allergy but many years ago and she recalls it was mild. No issie with cephalosporin in past.  Relevant past medical, surgical, family and social history reviewed and updated as indicated. Interim medical history since our last visit reviewed. Allergies and medications reviewed and updated. Outpatient Medications Prior to Visit  Medication Sig Dispense Refill   ADVAIR HFA 230-21 MCG/ACT inhaler Inhale 2 puffs into the lungs 2 (two) times daily. 2 puffs 1-2 times daily depending on disease activity. 36 g 1   albuterol (PROVENTIL) (2.5 MG/3ML) 0.083% nebulizer solution Take 3 mLs  (2.5 mg total) by nebulization every 6 (six) hours as needed for wheezing or shortness of breath. 150 mL 1   albuterol (VENTOLIN HFA) 108 (90 Base) MCG/ACT inhaler INHALE 2 PUFFS BY MOUTH EVERY 6 HOURS AS NEEDED FOR WHEEZING OR SHORTNESS OF BREATH 18 g 1   anastrozole (ARIMIDEX) 1 MG tablet TAKE 1 TABLET(1 MG) BY MOUTH DAILY 90 tablet 3   atorvastatin (LIPITOR) 10 MG tablet TAKE 1 TABLET(10 MG) BY MOUTH DAILY 90 tablet 3   BINAXNOW COVID-19 AG HOME TEST KIT 8 kits by Does not apply route as needed.     Blood Glucose Monitoring Suppl (ONETOUCH VERIO) w/Device KIT Use to check blood sugar up to 2 times a day 1 kit 0   CALCIUM PO Take 1,000 mg by mouth daily.     cyclobenzaprine (FLEXERIL) 10 MG tablet Take 0.5-1 tablets (5-10 mg total) by mouth at bedtime as needed for muscle spasms. 20 tablet 0   desloratadine (CLARINEX) 5 MG tablet TAKE 1 TABLET(5 MG) BY MOUTH DAILY 90 tablet 1   doxycycline (VIBRA-TABS) 100 MG tablet Take 1 tablet (100 mg total) by mouth 2 (two) times daily for 10 days. 20 tablet 0   EPINEPHrine 0.3 mg/0.3 mL IJ SOAJ injection USE AS DIRECTED FOR LIFE THREATENING ALLERGIC REACTIONS 2 each 1   estradiol (ESTRACE) 0.1 MG/GM vaginal  cream Place 1 Applicatorful vaginally 2 (two) times a week. (As Needed)     famotidine (PEPCID) 40 MG tablet Take 1 (ONE) tablet by mouth every evening. 90 tablet 1   fluticasone (FLOVENT HFA) 220 MCG/ACT inhaler Inhale 2 puffs into the lungs 2 (two) times daily. 2 puffs 2 times daily during flare up. 36 g 1   gabapentin (NEURONTIN) 300 MG capsule Take one capsule in the morning, one at lunch and two at bedtime 360 capsule 3   glucose blood (ONETOUCH VERIO) test strip 1 strip 2 (two) times daily     GLYXAMBI 10-5 MG TABS TAKE 1 TABLET BY MOUTH DAILY 30 tablet 5   hydrOXYzine (ATARAX/VISTARIL) 10 MG tablet Take 1 tablet (10 mg total) by mouth daily as needed for itching. 30 tablet 2   ipratropium (ATROVENT) 0.06 % nasal spray Place 2 sprays into both  nostrils 2 (two) times daily as needed for rhinitis. 1-2 sprays in each nostril 1-2 times per day as needed to dry nose 45 mL 1   irbesartan (AVAPRO) 150 MG tablet TAKE 1 TABLET(150 MG) BY MOUTH DAILY 90 tablet 2   Lactase (DAIRY-RELIEF PO) Take 1 capsule by mouth daily as needed (eating dairy).      Lancets (ONETOUCH DELICA PLUS LANCET33G) MISC USE TO CHECK BLOOD SUGAR UP TO TWICE DAILY 100 each 5   levalbuterol (XOPENEX) 1.25 MG/3ML nebulizer solution USE 1 VIAL VIA NEBULIZER EVERY 6 HOURS AS NEEDED FOR SHORTNESS OF BREATH OR WHEEZING 90 mL 3   Magnesium 500 MG TABS Take 500 mg by mouth daily.     Menthol, Topical Analgesic, (ABSORBINE PLUS JR EX) Apply 1 patch topically daily as needed (pain).     Menthol-Methyl Salicylate (SALONPAS JET SPRAY EX) Apply 1 spray topically daily as needed (knee pain).     methimazole (TAPAZOLE) 5 MG tablet Take 1 tablet (5 mg total) by mouth 2 (two) times a week.     montelukast (SINGULAIR) 10 MG tablet Take 1 tablet (10 mg total) by mouth at bedtime. For asthma control. 90 tablet 1   Mouthwashes (BIOTENE DRY MOUTH MT) Use as directed 1 Dose in the mouth or throat daily as needed (dry mouth).     ondansetron (ZOFRAN-ODT) 8 MG disintegrating tablet Take 1 tablet (8 mg total) by mouth every 8 (eight) hours as needed for nausea or vomiting. 20 tablet 0   pantoprazole (PROTONIX) 40 MG tablet Take 1 (ONE) tablet by mouth every morning. 90 tablet 1   Plecanatide (TRULANCE) 3 MG TABS Take 3 mg by mouth daily as needed (constipation).     Polyethyl Glycol-Propyl Glycol (SYSTANE OP) Place 1 drop into both eyes daily as needed (dry eyes).     polyethylene glycol powder (GLYCOLAX/MIRALAX) 17 GM/SCOOP powder Take 1 Container by mouth once.     polyethylene glycol-electrolytes (NULYTELY) 420 g solution Take 4,000 mLs by mouth once.     PRESCRIPTION MEDICATION Inhale into the lungs at bedtime. CPAP     Respiratory Therapy Supplies (NEBULIZER MASK ADULT) MISC 1 kit by Does not  apply route as directed. 1 each 1   Tiotropium Bromide Monohydrate (SPIRIVA RESPIMAT) 1.25 MCG/ACT AERS Inhale 1 Inhalation into the lungs daily. 4 g 5   tolterodine (DETROL LA) 4 MG 24 hr capsule Take 4 mg by mouth at bedtime.     triamcinolone (NASACORT) 55 MCG/ACT AERO nasal inhaler Place 2 sprays into the nose daily. 50.7 mL 1   XARELTO 20 MG TABS tablet TAKE  1 TABLET(20 MG) BY MOUTH DAILY WITH SUPPER 90 tablet 1   Facility-Administered Medications Prior to Visit  Medication Dose Route Frequency Provider Last Rate Last Admin   omalizumab Geoffry Paradise) injection 300 mg  300 mg Subcutaneous Q28 days Jessica Priest, MD   300 mg at 12/25/22 0921   tezepelumab-ekko (TEZSPIRE) 210 MG/1. syringe 210 mg  210 mg Subcutaneous Q28 days Jessica Priest, MD   210 mg at 01/15/23 1136     Per HPI unless specifically indicated in ROS section below Review of Systems  Constitutional:  Negative for fatigue and fever.  HENT:  Negative for congestion.   Eyes:  Negative for pain.  Respiratory:  Negative for cough and shortness of breath.   Cardiovascular:  Negative for chest pain, palpitations and leg swelling.  Gastrointestinal:  Negative for abdominal pain.  Genitourinary:  Negative for dysuria and vaginal bleeding.  Musculoskeletal:  Negative for back pain.  Neurological:  Negative for syncope, light-headedness and headaches.  Psychiatric/Behavioral:  Negative for dysphoric mood.    Objective:  BP 128/70   Pulse (!) 53   Temp 97.8 F (36.6 C)   Resp 16   Ht 5\' 4"  (1.626 m)   Wt 204 lb 2 oz (92.6 kg)   SpO2 98%   BMI 35.04 kg/m   Wt Readings from Last 3 Encounters:  02/07/23 204 lb 2 oz (92.6 kg)  02/04/23 203 lb (92.1 kg)  01/29/23 201 lb 4.8 oz (91.3 kg)      Physical Exam Constitutional:      General: She is not in acute distress.    Appearance: Normal appearance. She is well-developed. She is not ill-appearing or toxic-appearing.  HENT:     Head: Normocephalic.     Right Ear:  Hearing, tympanic membrane, ear canal and external ear normal. Tympanic membrane is not erythematous, retracted or bulging.     Left Ear: Hearing, tympanic membrane, ear canal and external ear normal. Tympanic membrane is not erythematous, retracted or bulging.     Nose: No mucosal edema or rhinorrhea.     Right Sinus: No maxillary sinus tenderness or frontal sinus tenderness.     Left Sinus: No maxillary sinus tenderness or frontal sinus tenderness.     Mouth/Throat:     Mouth: Oropharynx is clear and moist and mucous membranes are normal.     Pharynx: Uvula midline.  Eyes:     General: Lids are normal. Lids are everted, no foreign bodies appreciated.     Extraocular Movements: EOM normal.     Conjunctiva/sclera: Conjunctivae normal.     Pupils: Pupils are equal, round, and reactive to light.  Neck:     Thyroid: No thyroid mass or thyromegaly.     Vascular: No carotid bruit.     Trachea: Trachea normal.  Cardiovascular:     Rate and Rhythm: Normal rate and regular rhythm.     Pulses: Normal pulses.     Heart sounds: Normal heart sounds, S1 normal and S2 normal. No murmur heard.    No friction rub. No gallop.  Pulmonary:     Effort: Pulmonary effort is normal. No tachypnea or respiratory distress.     Breath sounds: Normal breath sounds. No decreased breath sounds, wheezing, rhonchi or rales.  Abdominal:     General: Bowel sounds are normal.     Palpations: Abdomen is soft.     Tenderness: There is no abdominal tenderness.  Musculoskeletal:     Cervical back: Normal range of  motion and neck supple.  Skin:    General: Skin is warm, dry and intact.     Findings: Bruising, rash and wound present. Rash is crusting.     Comments: See photos attached  Neurological:     Mental Status: She is alert.  Psychiatric:        Mood and Affect: Mood is not anxious or depressed.        Speech: Speech normal.        Behavior: Behavior normal. Behavior is cooperative.        Thought Content:  Thought content normal.        Cognition and Memory: Cognition and memory normal.        Judgment: Judgment normal.          Results for orders placed or performed in visit on 02/04/23  WOUND CULTURE   Specimen: Wound  Result Value Ref Range   MICRO NUMBER: [id number]    SPECIMEN QUALITY: Adequate    SOURCE: WOUND (SITE NOT SPECIFIED)    STATUS: PRELIMINARY    GRAM STAIN:      Few epithelial cells No white blood cells seen No organisms seen   RESULT: No growth to date    *Note: Due to a large number of results and/or encounters for the requested time period, some results have not been displayed. A complete set of results can be found in Results Review.    Assessment and Plan  Cellulitis of right toe Assessment & Plan: Acute, minimal improvement in cellulitis with worsening pain in setting of diabetes. Will broaden antibiotic coverage with injection of Rocephin 1 g x 1 in office today.  She will continue doxycycline and follow-up in office tomorrow.  If she continues to not have improvement we can consider referral back to her podiatrist versus wound care consult .    Type 2 diabetes mellitus with diabetic neuropathy, unspecified whether long term insulin use (HCC)  Vaginal candida Assessment & Plan: Now with vaginal discharge and itching after starting antibiotics.  I will provide fluconazole 150 mg x 1 to take now and again at the end of the antibiotic course for likely Candida.   Other orders -     Fluconazole; Take 1 tablet (150 mg total) by mouth once for 1 dose.  Dispense: 2 tablet; Refill: 0    Return in about 1 day (around 02/08/2023) for  follow up cellulitis.   Kerby Nora, MD

## 2023-02-07 NOTE — Patient Instructions (Signed)
Go to ER if redness spreading  or fever on antibiotics.  Elevated foot above heart.  Dress wound loosely and apply topical antibiotics.  No need for soaks.

## 2023-02-07 NOTE — Assessment & Plan Note (Signed)
Acute, minimal improvement in cellulitis with worsening pain in setting of diabetes. Will broaden antibiotic coverage with injection of Rocephin 1 g x 1 in office today.  She will continue doxycycline and follow-up in office tomorrow.  If she continues to not have improvement we can consider referral back to her podiatrist versus wound care consult .

## 2023-02-07 NOTE — Assessment & Plan Note (Signed)
Now with vaginal discharge and itching after starting antibiotics.  I will provide fluconazole 150 mg x 1 to take now and again at the end of the antibiotic course for likely Candida.

## 2023-02-08 ENCOUNTER — Ambulatory Visit: Payer: Medicare Other | Admitting: Family Medicine

## 2023-02-08 ENCOUNTER — Encounter: Payer: Self-pay | Admitting: Family Medicine

## 2023-02-08 VITALS — BP 120/72 | HR 101 | Temp 97.9°F | Ht 64.0 in | Wt 201.4 lb

## 2023-02-08 DIAGNOSIS — L03031 Cellulitis of right toe: Secondary | ICD-10-CM

## 2023-02-08 DIAGNOSIS — S91301A Unspecified open wound, right foot, initial encounter: Secondary | ICD-10-CM | POA: Diagnosis not present

## 2023-02-08 DIAGNOSIS — E1140 Type 2 diabetes mellitus with diabetic neuropathy, unspecified: Secondary | ICD-10-CM | POA: Diagnosis not present

## 2023-02-08 MED ORDER — CLINDAMYCIN HCL 300 MG PO CAPS: 300.0000 mg | ORAL_CAPSULE | Freq: Three times a day (TID) | ORAL | 0 refills | Status: DC

## 2023-02-08 NOTE — Patient Instructions (Addendum)
Wear post op shoe to off load wound.  Complete doxycycline and start additional antibiotic clindamycin for 7 days.  Keep the area clean and dry.  We will place a wound care referral. Call if redness spreading or pain returning.

## 2023-02-08 NOTE — Progress Notes (Signed)
Patient ID: [name], female    DOB: [birth year], 77 y.o.   MRN: [id number]  This visit was conducted in person.  BP 120/72 (BP Location: Right Arm, Patient Position: Sitting, Cuff Size: Large)   Pulse (!) 101   Temp 97.9 F (36.6 C) (Temporal)   Ht 5\' 4"  (1.626 m)   Wt 201 lb 6 oz (91.3 kg)   SpO2 97%   BMI 34.57 kg/m    CC:  Chief Complaint  Patient presents with   Cellulitis    Follow up Right Toe   Shoulder Pain    Right    Subjective:   HPI: [name] is a 77 y.o. female presenting on 02/08/2023 for Cellulitis (Follow up Right Toe) and Shoulder Pain (Right)   Some improvement in right great toe and second right digit redness and swelling.  No fever.  Improvement in pain, but toe is still uncomfortable.  Status post ceftriaxone injection 1 g x 1 on July 25. Previously unresponsive to doxycycline alone.      Relevant past medical, surgical, family and social history reviewed and updated as indicated. Interim medical history since our last visit reviewed. Allergies and medications reviewed and updated. Outpatient Medications Prior to Visit  Medication Sig Dispense Refill   anastrozole (ARIMIDEX) 1 MG tablet TAKE 1 TABLET(1 MG) BY MOUTH DAILY 90 tablet 3   atorvastatin (LIPITOR) 10 MG tablet TAKE 1 TABLET(10 MG) BY MOUTH DAILY 90 tablet 3   Blood Glucose Monitoring Suppl (ONETOUCH VERIO) w/Device KIT Use to check blood sugar up to 2 times a day 1 kit 0   cyclobenzaprine (FLEXERIL) 10 MG tablet Take 0.5-1 tablets (5-10 mg total) by mouth at bedtime as needed for muscle spasms. 20 tablet 0   desloratadine (CLARINEX) 5 MG tablet TAKE 1 TABLET(5 MG) BY MOUTH DAILY 90 tablet 1   doxycycline (VIBRA-TABS) 100 MG tablet Take 1 tablet (100 mg total) by mouth 2 (two) times daily for 10 days. 20 tablet 0   estradiol (ESTRACE) 0.1 MG/GM vaginal cream Place 1 Applicatorful vaginally 2 (two) times a week. (As Needed)     gabapentin (NEURONTIN) 300 MG capsule Take one  capsule in the morning, one at lunch and two at bedtime 360 capsule 3   glucose blood (ONETOUCH VERIO) test strip 1 strip 2 (two) times daily     GLYXAMBI 10-5 MG TABS TAKE 1 TABLET BY MOUTH DAILY 30 tablet 5   hydrOXYzine (ATARAX/VISTARIL) 10 MG tablet Take 1 tablet (10 mg total) by mouth daily as needed for itching. 30 tablet 2   irbesartan (AVAPRO) 150 MG tablet TAKE 1 TABLET(150 MG) BY MOUTH DAILY 90 tablet 2   Lactase (DAIRY-RELIEF PO) Take 1 capsule by mouth daily as needed (eating dairy).      Lancets (ONETOUCH DELICA PLUS LANCET33G) MISC USE TO CHECK BLOOD SUGAR UP TO TWICE DAILY 100 each 5   levalbuterol (XOPENEX) 1.25 MG/3ML nebulizer solution USE 1 VIAL VIA NEBULIZER EVERY 6 HOURS AS NEEDED FOR SHORTNESS OF BREATH OR WHEEZING 90 mL 3   Magnesium 500 MG TABS Take 500 mg by mouth daily.     Menthol, Topical Analgesic, (ABSORBINE PLUS JR EX) Apply 1 patch topically daily as needed (pain).     Menthol-Methyl Salicylate (SALONPAS JET SPRAY EX) Apply 1 spray topically daily as needed (knee pain).     methimazole (TAPAZOLE) 5 MG tablet Take 1 tablet (5 mg total) by mouth 2 (two) times a week.  Mouthwashes (BIOTENE DRY MOUTH MT) Use as directed 1 Dose in the mouth or throat daily as needed (dry mouth).     ondansetron (ZOFRAN-ODT) 8 MG disintegrating tablet Take 1 tablet (8 mg total) by mouth every 8 (eight) hours as needed for nausea or vomiting. 20 tablet 0   Plecanatide (TRULANCE) 3 MG TABS Take 3 mg by mouth daily as needed (constipation).     Polyethyl Glycol-Propyl Glycol (SYSTANE OP) Place 1 drop into both eyes daily as needed (dry eyes).     polyethylene glycol powder (GLYCOLAX/MIRALAX) 17 GM/SCOOP powder Take 1 Container by mouth once.     polyethylene glycol-electrolytes (NULYTELY) 420 g solution Take 4,000 mLs by mouth once.     PRESCRIPTION MEDICATION Inhale into the lungs at bedtime. CPAP     Respiratory Therapy Supplies (NEBULIZER MASK ADULT) MISC 1 kit by Does not apply route  as directed. 1 each 1   Tiotropium Bromide Monohydrate (SPIRIVA RESPIMAT) 1.25 MCG/ACT AERS Inhale 1 Inhalation into the lungs daily. (Patient not taking: Reported on 02/18/2023) 4 g 5   tolterodine (DETROL LA) 4 MG 24 hr capsule Take 4 mg by mouth at bedtime.     XARELTO 20 MG TABS tablet TAKE 1 TABLET(20 MG) BY MOUTH DAILY WITH SUPPER 90 tablet 1   ADVAIR HFA 230-21 MCG/ACT inhaler Inhale 2 puffs into the lungs 2 (two) times daily. 2 puffs 1-2 times daily depending on disease activity. 36 g 1   albuterol (PROVENTIL) (2.5 MG/3ML) 0.083% nebulizer solution Take 3 mLs (2.5 mg total) by nebulization every 6 (six) hours as needed for wheezing or shortness of breath. 150 mL 1   albuterol (VENTOLIN HFA) 108 (90 Base) MCG/ACT inhaler INHALE 2 PUFFS BY MOUTH EVERY 6 HOURS AS NEEDED FOR WHEEZING OR SHORTNESS OF BREATH 18 g 1   EPINEPHrine 0.3 mg/0.3 mL IJ SOAJ injection USE AS DIRECTED FOR LIFE THREATENING ALLERGIC REACTIONS 2 each 1   famotidine (PEPCID) 40 MG tablet Take 1 (ONE) tablet by mouth every evening. 90 tablet 1   fluticasone (FLOVENT HFA) 220 MCG/ACT inhaler Inhale 2 puffs into the lungs 2 (two) times daily. 2 puffs 2 times daily during flare up. 36 g 1   ipratropium (ATROVENT) 0.06 % nasal spray Place 2 sprays into both nostrils 2 (two) times daily as needed for rhinitis. 1-2 sprays in each nostril 1-2 times per day as needed to dry nose 45 mL 1   montelukast (SINGULAIR) 10 MG tablet Take 1 tablet (10 mg total) by mouth at bedtime. For asthma control. 90 tablet 1   pantoprazole (PROTONIX) 40 MG tablet Take 1 (ONE) tablet by mouth every morning. 90 tablet 1   triamcinolone (NASACORT) 55 MCG/ACT AERO nasal inhaler Place 2 sprays into the nose daily. 50.7 mL 1   BINAXNOW COVID-19 AG HOME TEST KIT 8 kits by Does not apply route as needed.     CALCIUM PO Take 1,000 mg by mouth daily.     Facility-Administered Medications Prior to Visit  Medication Dose Route Frequency Provider Last Rate Last Admin    tezepelumab-ekko (TEZSPIRE) 210 MG/1. syringe 210 mg  210 mg Subcutaneous Q28 days Jessica Priest, MD   210 mg at 02/14/23 1027   omalizumab Geoffry Paradise) injection 300 mg  300 mg Subcutaneous Q28 days Jessica Priest, MD   300 mg at 12/25/22 1308     Per HPI unless specifically indicated in ROS section below Review of Systems  Constitutional:  Negative for fatigue and fever.  HENT:  Negative for congestion.   Eyes:  Negative for pain.  Respiratory:  Negative for cough and shortness of breath.   Cardiovascular:  Negative for chest pain, palpitations and leg swelling.  Gastrointestinal:  Negative for abdominal pain.  Genitourinary:  Negative for dysuria and vaginal bleeding.  Musculoskeletal:  Negative for back pain.  Neurological:  Negative for syncope, light-headedness and headaches.  Psychiatric/Behavioral:  Negative for dysphoric mood.    Objective:  BP 120/72 (BP Location: Right Arm, Patient Position: Sitting, Cuff Size: Large)   Pulse (!) 101   Temp 97.9 F (36.6 C) (Temporal)   Ht 5\' 4"  (1.626 m)   Wt 201 lb 6 oz (91.3 kg)   SpO2 97%   BMI 34.57 kg/m   Wt Readings from Last 3 Encounters:  02/18/23 198 lb (89.8 kg)  02/12/23 204 lb (92.5 kg)  02/08/23 201 lb 6 oz (91.3 kg)      Physical Exam Constitutional:      General: She is not in acute distress.    Appearance: Normal appearance. She is well-developed. She is obese. She is not ill-appearing or toxic-appearing.  HENT:     Head: Normocephalic.     Right Ear: Hearing, tympanic membrane, ear canal and external ear normal. Tympanic membrane is not erythematous, retracted or bulging.     Left Ear: Hearing, tympanic membrane, ear canal and external ear normal. Tympanic membrane is not erythematous, retracted or bulging.     Nose: No mucosal edema or rhinorrhea.     Right Sinus: No maxillary sinus tenderness or frontal sinus tenderness.     Left Sinus: No maxillary sinus tenderness or frontal sinus tenderness.      Mouth/Throat:     Mouth: Oropharynx is clear and moist and mucous membranes are normal.     Pharynx: Uvula midline.  Eyes:     General: Lids are normal. Lids are everted, no foreign bodies appreciated.     Extraocular Movements: EOM normal.     Conjunctiva/sclera: Conjunctivae normal.     Pupils: Pupils are equal, round, and reactive to light.  Neck:     Thyroid: No thyroid mass or thyromegaly.     Vascular: No carotid bruit.     Trachea: Trachea normal.  Cardiovascular:     Rate and Rhythm: Normal rate and regular rhythm.     Pulses: Normal pulses.     Heart sounds: Normal heart sounds, S1 normal and S2 normal. No murmur heard.    No friction rub. No gallop.  Pulmonary:     Effort: Pulmonary effort is normal. No tachypnea or respiratory distress.     Breath sounds: Normal breath sounds. No decreased breath sounds, wheezing, rhonchi or rales.  Abdominal:     General: Bowel sounds are normal.     Palpations: Abdomen is soft.     Tenderness: There is no abdominal tenderness.  Musculoskeletal:     Cervical back: Normal range of motion and neck supple.  Skin:    General: Skin is warm, dry and intact.     Findings: No rash.  Neurological:     Mental Status: She is alert.  Psychiatric:        Mood and Affect: Mood is not anxious or depressed.        Speech: Speech normal.        Behavior: Behavior normal. Behavior is cooperative.        Thought Content: Thought content normal.        Cognition  and Memory: Cognition and memory normal.        Judgment: Judgment normal.          Results for orders placed or performed in visit on 02/04/23  WOUND CULTURE   Specimen: Wound  Result Value Ref Range   MICRO NUMBER: [id number]    SPECIMEN QUALITY: Adequate    SOURCE: WOUND (SITE NOT SPECIFIED)    STATUS: FINAL    GRAM STAIN:      Few epithelial cells No white blood cells seen No organisms seen   RESULT: No Growth    *Note: Due to a large number of results and/or encounters for  the requested time period, some results have not been displayed. A complete set of results can be found in Results Review.    Assessment and Plan  Wound of right foot -     Ambulatory referral to Wound Clinic  Cellulitis of right toe  Type 2 diabetes mellitus with diabetic neuropathy, unspecified whether long term insulin use (HCC)  Other orders -     Clindamycin HCl; Take 1 capsule (300 mg total) by mouth 3 (three) times daily.  Dispense: 21 capsule; Refill: 0  Given minimal improvement in pain with possible cellulitis, slowly healing wound on right great toe in setting of diabetic with neuropathy, I will make a referral to the wound care center for further treatment. She will continue doxycycline and we will broaden her antibiotic coverage by adding clindamycin 300 mg 3 times daily x 10 days. Continue to keep area warm and dry, apply topical antibiotic ointment Return and ER precautions provided. No follow-ups on file.   Kerby Nora, MD

## 2023-02-12 ENCOUNTER — Ambulatory Visit (INDEPENDENT_AMBULATORY_CARE_PROVIDER_SITE_OTHER): Payer: Medicare Other | Admitting: Allergy and Immunology

## 2023-02-12 VITALS — BP 138/90 | HR 72 | Temp 98.8°F | Resp 16 | Ht 64.0 in | Wt 204.0 lb

## 2023-02-12 DIAGNOSIS — T781XXD Other adverse food reactions, not elsewhere classified, subsequent encounter: Secondary | ICD-10-CM | POA: Diagnosis not present

## 2023-02-12 DIAGNOSIS — K224 Dyskinesia of esophagus: Secondary | ICD-10-CM

## 2023-02-12 DIAGNOSIS — J4550 Severe persistent asthma, uncomplicated: Secondary | ICD-10-CM

## 2023-02-12 DIAGNOSIS — J3089 Other allergic rhinitis: Secondary | ICD-10-CM

## 2023-02-12 DIAGNOSIS — T781XXA Other adverse food reactions, not elsewhere classified, initial encounter: Secondary | ICD-10-CM

## 2023-02-12 DIAGNOSIS — K219 Gastro-esophageal reflux disease without esophagitis: Secondary | ICD-10-CM | POA: Diagnosis not present

## 2023-02-12 MED ORDER — FLUTICASONE PROPIONATE HFA 220 MCG/ACT IN AERO: 2.0000 | INHALATION_SPRAY | Freq: Two times a day (BID) | RESPIRATORY_TRACT | 1 refills | Status: DC

## 2023-02-12 MED ORDER — ADVAIR HFA 230-21 MCG/ACT IN AERO: 2.0000 | INHALATION_SPRAY | Freq: Two times a day (BID) | RESPIRATORY_TRACT | 1 refills | Status: DC

## 2023-02-12 MED ORDER — EPINEPHRINE 0.3 MG/0.3ML IJ SOAJ: INTRAMUSCULAR | 1 refills | Status: DC

## 2023-02-12 MED ORDER — ALBUTEROL SULFATE HFA 108 (90 BASE) MCG/ACT IN AERS: INHALATION_SPRAY | RESPIRATORY_TRACT | 1 refills | Status: DC

## 2023-02-12 MED ORDER — ALBUTEROL SULFATE (2.5 MG/3ML) 0.083% IN NEBU: 2.5000 mg | INHALATION_SOLUTION | Freq: Four times a day (QID) | RESPIRATORY_TRACT | 1 refills | Status: DC | PRN

## 2023-02-12 MED ORDER — PANTOPRAZOLE SODIUM 40 MG PO TBEC: 40.0000 mg | DELAYED_RELEASE_TABLET | Freq: Two times a day (BID) | ORAL | 1 refills | Status: DC

## 2023-02-12 MED ORDER — MONTELUKAST SODIUM 10 MG PO TABS: 10.0000 mg | ORAL_TABLET | Freq: Every day | ORAL | 1 refills | Status: DC

## 2023-02-12 MED ORDER — IPRATROPIUM BROMIDE 0.06 % NA SOLN: 2.0000 | Freq: Two times a day (BID) | NASAL | 1 refills | Status: DC | PRN

## 2023-02-12 MED ORDER — NEBULIZER MISC: 1.0000 | 1 refills | Status: DC

## 2023-02-12 MED ORDER — TRIAMCINOLONE ACETONIDE 55 MCG/ACT NA AERO: 2.0000 | INHALATION_SPRAY | Freq: Every day | NASAL | 1 refills | Status: DC

## 2023-02-12 MED ORDER — FAMOTIDINE 40 MG PO TABS: ORAL_TABLET | ORAL | 1 refills | Status: DC

## 2023-02-12 NOTE — Patient Instructions (Addendum)
  1. Continue Advair 230 - 2 inhalations 2 times a day (empty lungs)  2. Continue Montelukast 10mg  - 1 tablet 1 time per day.  3. Continue Tezepelumab injections.    5. Continue to treat reflux:  A. INCREASE pantoprazole 40mg  2 times per day B. Famotidine 40 mg in the PM.   C. OTC Tums throughout the day if needed  6. If needed:   A. Nasal saline several times per day  B. Ipratropium 0.06% -1-2 sprays each nostril 1-2 times per day   C. OTC NASACORT - 1 spray each nostril 1 time per day   D. Proventil HFA or albuterol nebulization  E. Epi-pen  F.  Mucinex DM  G. Biotene mouthwash    7. Add Flovent 220 - 2 inhalations 2 times a day during "FLARE UP"  8. Revisit with GI doctor about slow swallowing  9. Obtain a barium swallow for slow swallowing  10. Return to clinic in 12 weeks or earlier if there is a problem.

## 2023-02-12 NOTE — Progress Notes (Unsigned)
[place] - [place] - [place] - [place] - [place]   Follow-up Note  Referring Provider: Excell Seltzer, MD Primary Provider: Excell Seltzer, MD Date of Office Visit: 02/12/2023  Subjective:   [name] (DOB: May 23, 1946) is a 77 y.o. female [facility] in re-evaluation of the following:  HPI: [name] returns to this clinic in evaluation of severe asthma, allergic rhinitis, LPR, history of pulmonary hypertension/diastolic dysfunction, and food allergy directed against shellfish and fish.  I last saw her in this clinic 15 January 2023.  She has had a new development in the past 2 weeks.  She has had some more sneezing and she has had some more coughing and wheezing and has had to use a short acting bronchodilator and she has developed some postprandial sternal chest pain over the course of the past several days.  Her postprandial sternal chest pain appears to correlate with the use of some type of antibiotic that was administered for an episode of toe cellulitis.  Apparently she received not just 1 oral antibiotic but an injection of an antibiotic.  She has definitely been having more problems with reflux and she has noticed that her swallowing is getting a little bit slower at times.  Allergies as of 02/12/2023       Reactions   Dilantin [phenytoin] Swelling   facial swelling   Latex Hives   Oxycodone Nausea And Vomiting, Nausea Only   Abdominal Pain, Vomiting   Penicillins Nausea And Vomiting, Swelling   Has patient had a PCN reaction causing immediate rash, facial/tongue/throat swelling, SOB or lightheadedness with hypotension patient had a PCN reaction causing severe rash involving mucus membranes or skin necrosis: [id number] Has patient had a PCN reaction that required hospitalization/No Has patient had a PCN reaction occurring within the last 10 years: No If all of the above answers are "NO", then may proceed with  Cephalosporin use.   Codeine Nausea Only   Tolerates tylenol with codeine   Hydrocodone Nausea And Vomiting   Nickel Hives, Rash   Pregabalin Itching, Other (See Comments)   headaches/problem w/vision        Medication List    ABSORBINE PLUS JR EX Apply 1 patch topically daily as needed (pain).   Advair HFA 230-21 MCG/ACT inhaler Generic drug: fluticasone-salmeterol Inhale 2 puffs into the lungs 2 (two) times daily. 2 puffs 1-2 times daily depending on disease activity.   albuterol 108 (90 Base) MCG/ACT inhaler Commonly known as: VENTOLIN HFA INHALE 2 PUFFS BY MOUTH EVERY 6 HOURS AS NEEDED FOR WHEEZING OR SHORTNESS OF BREATH   albuterol (2.5 MG/3ML) 0.083% nebulizer solution Commonly known as: PROVENTIL Take 3 mLs (2.5 mg total) by nebulization every 6 (six) hours as needed for wheezing or shortness of breath.   anastrozole 1 MG tablet Commonly known as: ARIMIDEX TAKE 1 TABLET(1 MG) BY MOUTH DAILY   atorvastatin 10 MG tablet Commonly known as: LIPITOR TAKE 1 TABLET(10 MG) BY MOUTH DAILY   BIOTENE DRY MOUTH MT Use as directed 1 Dose in the mouth or throat daily as needed (dry mouth).   clindamycin 300 MG capsule Commonly known as: CLEOCIN Take 1 capsule (300 mg total) by mouth 3 (three) times daily.   cyclobenzaprine 10 MG tablet Commonly known as: FLEXERIL Take 0.5-1 tablets (5-10 mg total) by mouth at bedtime as needed for muscle spasms.   DAIRY-RELIEF PO Take 1 capsule by mouth daily as needed (eating dairy).  desloratadine 5 MG tablet Commonly known as: CLARINEX TAKE 1 TABLET(5 MG) BY MOUTH DAILY   doxycycline 100 MG tablet Commonly known as: VIBRA-TABS Take 1 tablet (100 mg total) by mouth 2 (two) times daily for 10 days.   EPINEPHrine 0.3 mg/0.3 mL Soaj injection Commonly known as: EPI-PEN USE AS DIRECTED FOR LIFE THREATENING ALLERGIC REACTIONS   estradiol 0.1 MG/GM vaginal cream Commonly known as: ESTRACE Place 1 Applicatorful vaginally 2 (two)  times a week. (As Needed)   famotidine 40 MG tablet Commonly known as: PEPCID Take 1 (ONE) tablet by mouth every evening.   fluconazole 150 MG tablet Commonly known as: DIFLUCAN Take 150 mg by mouth once.   fluticasone 220 MCG/ACT inhaler Commonly known as: Flovent HFA Inhale 2 puffs into the lungs 2 (two) times daily. 2 puffs 2 times daily during flare up.   gabapentin 300 MG capsule Commonly known as: NEURONTIN Take one capsule in the morning, one at lunch and two at bedtime   Glyxambi 10-5 MG Tabs Generic drug: Empagliflozin-linaGLIPtin TAKE 1 TABLET BY MOUTH DAILY   hydrOXYzine 10 MG tablet Commonly known as: ATARAX Take 1 tablet (10 mg total) by mouth daily as needed for itching.   ipratropium 0.06 % nasal spray Commonly known as: ATROVENT Place 2 sprays into both nostrils 2 (two) times daily as needed for rhinitis. 1-2 sprays in each nostril 1-2 times per day as needed to dry nose   irbesartan 150 MG tablet Commonly known as: AVAPRO TAKE 1 TABLET(150 MG) BY MOUTH DAILY   levalbuterol 1.25 MG/3ML nebulizer solution Commonly known as: XOPENEX USE 1 VIAL VIA NEBULIZER EVERY 6 HOURS AS NEEDED FOR SHORTNESS OF BREATH OR WHEEZING   Magnesium 500 MG Tabs Take 500 mg by mouth daily.   methimazole 5 MG tablet Commonly known as: TAPAZOLE Take 1 tablet (5 mg total) by mouth 2 (two) times a week.   montelukast 10 MG tablet Commonly known as: SINGULAIR Take 1 tablet (10 mg total) by mouth at bedtime. For asthma control.   Nebulizer Mask Adult Misc 1 kit by Does not apply route as directed.   ondansetron 8 MG disintegrating tablet Commonly known as: ZOFRAN-ODT Take 1 tablet (8 mg total) by mouth every 8 (eight) hours as needed for nausea or vomiting.   OneTouch Delica Plus Lancet33G Misc USE TO CHECK BLOOD SUGAR UP TO TWICE DAILY   OneTouch Verio test strip Generic drug: glucose blood 1 strip 2 (two) times daily   OneTouch Verio w/Device Kit Use to check blood  sugar up to 2 times a day   pantoprazole 40 MG tablet Commonly known as: PROTONIX Take 1 (ONE) tablet by mouth every morning.   polyethylene glycol powder 17 GM/SCOOP powder Commonly known as: GLYCOLAX/MIRALAX Take 1 Container by mouth once.   polyethylene glycol-electrolytes 420 g solution Commonly known as: NuLYTELY Take 4,000 mLs by mouth once.   PRESCRIPTION MEDICATION Inhale into the lungs at bedtime. CPAP   SALONPAS JET SPRAY EX Apply 1 spray topically daily as needed (knee pain).   Spiriva Respimat 1.25 MCG/ACT Aers Generic drug: Tiotropium Bromide Monohydrate Inhale 1 Inhalation into the lungs daily.   SYSTANE OP Place 1 drop into both eyes daily as needed (dry eyes).   tolterodine 4 MG 24 hr capsule Commonly known as: DETROL LA Take 4 mg by mouth at bedtime.   triamcinolone 55 MCG/ACT Aero nasal inhaler Commonly known as: NASACORT Place 2 sprays into the nose daily.   Trulance 3 MG Tabs  Generic drug: Plecanatide Take 3 mg by mouth daily as needed (constipation).   Xarelto 20 MG Tabs tablet Generic drug: rivaroxaban TAKE 1 TABLET(20 MG) BY MOUTH DAILY WITH SUPPER    Past Medical History:  Diagnosis Date   Allergic rhinitis    Allergy    Arthritis    Asthma    Breast cancer (HCC)    Chronic headache    Diabetes mellitus    Ductal carcinoma in situ (DCIS) of left breast 02/23/2022   DVT (deep venous thrombosis) (HCC)    Dyspnea    Heart murmur    History of radiation therapy    Left Breast 05/02/22-05/30/22- Dr. Antony Blackbird   Hypertension    Penicillin allergy 01/19/2020   Pulmonary embolism (HCC)    Pulmonary hypertension (HCC)    Seizures (HCC)     Past Surgical History:  Procedure Laterality Date   ABDOMINAL HYSTERECTOMY     partial, has ovaries   BREAST LUMPECTOMY WITH RADIOACTIVE SEED LOCALIZATION Left 03/29/2022   Procedure: LEFT BREAST LUMPECTOMY WITH RADIOACTIVE SEED LOCALIZATION;  Surgeon: Harriette Bouillon, MD;  Location: [facility] OR;   Service: General;  Laterality: Left;   CARDIAC CATHETERIZATION  12/28/2010   Mod. pulmonary hypertension, normal coronary arteries   CARDIOVERSION N/A 11/23/2020   Procedure: CARDIOVERSION;  Surgeon: Thurmon Fair, MD;  Location: [facility] ENDOSCOPY;  Service: Cardiovascular;  Laterality: N/A;   CHOLECYSTECTOMY N/A 12/20/2021   Procedure: LAPAROSCOPIC CHOLECYSTECTOMY WITH INTRAOPERATIVE CHOLANGIOGRAM;  Surgeon: Darnell Level, MD;  Location: [facility] ORS;  Service: General;  Laterality: N/A;   DOPPLER ECHOCARDIOGRAPHY  10/08/2011   EF=>55%,mild asymmetric LVH, mod. TR, mod. PH, mild to mod LA dilatation   IR LUMBAR DISC ASPIRATION W/IMG GUIDE  01/12/2020   KNEE ARTHROSCOPY Left    KNEE SURGERY     Nuclear Stress Test  05/20/2006   No ischemia   PARTIAL HYSTERECTOMY     PLANTAR FASCIA SURGERY     TONSILLECTOMY      Review of systems negative except as noted in HPI / PMHx or noted below:  Review of Systems  Constitutional: Negative.   HENT: Negative.    Eyes: Negative.   Respiratory: Negative.    Cardiovascular: Negative.   Gastrointestinal: Negative.   Genitourinary: Negative.   Musculoskeletal: Negative.   Skin: Negative.   Neurological: Negative.   Endo/Heme/Allergies: Negative.   Psychiatric/Behavioral: Negative.       Objective:   Vitals:   02/12/23 1343  BP: (!) 138/90  Pulse: 72  Resp: 16  Temp: 98.8 F (37.1 C)  SpO2: 96%   Height: 5\' 4"  (162.6 cm)  Weight: 204 lb (92.5 kg)   Physical Exam Constitutional:      Appearance: She is not diaphoretic.  HENT:     Head: Normocephalic.     Right Ear: Tympanic membrane, ear canal and external ear normal.     Left Ear: Tympanic membrane, ear canal and external ear normal.     Nose: Nose normal. No mucosal edema or rhinorrhea.     Mouth/Throat:     Pharynx: Uvula midline. No oropharyngeal exudate.  Eyes:     Conjunctiva/sclera: Conjunctivae normal.  Neck:     Thyroid: No thyromegaly.     Trachea: Trachea normal. No tracheal  tenderness or tracheal deviation.  Cardiovascular:     Rate and Rhythm: Normal rate and regular rhythm.     Heart sounds: Normal heart sounds, S1 normal and S2 normal. No murmur heard. Pulmonary:     Effort: No  respiratory distress.     Breath sounds: Normal breath sounds. No stridor. No wheezing or rales.  Lymphadenopathy:     Head:     Right side of head: No tonsillar adenopathy.     Left side of head: No tonsillar adenopathy.     Cervical: No cervical adenopathy.  Skin:    Findings: No erythema or rash.     Nails: There is no clubbing.  Neurological:     Mental Status: She is alert.     Diagnostics: Spirometry was performed and demonstrated an FEV1 of 1.56 at 90 % of predicted.  Assessment and Plan:   1. Asthma, severe persistent, well-controlled   2. Other allergic rhinitis   3. LPRD (laryngopharyngeal reflux disease)   4. Adverse food reaction, initial encounter   5. Esophageal dysmotility     1. Continue Advair 230 - 2 inhalations 2 times a day (empty lungs)  2. Continue Montelukast 10mg  - 1 tablet 1 time per day.  3. Continue Tezepelumab injections.    5. Continue to treat reflux:  A. INCREASE pantoprazole 40mg  2 times per day B. Famotidine 40 mg in the PM.   C. OTC Tums throughout the day if needed  6. If needed:   A. Nasal saline several times per day  B. Ipratropium 0.06% -1-2 sprays each nostril 1-2 times per day   C. OTC NASACORT - 1 spray each nostril 1 time per day   D. Proventil HFA or albuterol nebulization  E. Epi-pen  F.  Mucinex DM  G. Biotene mouthwash    7. Add Flovent 220 - 2 inhalations 2 times a day during "FLARE UP"  8. Revisit with GI doctor about slow swallowing  9. Obtain a barium swallow for slow swallowing  10. Return to clinic in 12 weeks or earlier if there is a problem.  [name] appears to be doing pretty well regarding her respiratory tract although she may have contracted a viral respiratory tract infection in the past  week or so but her spirometry is the best it has been in a prolonged period in time and were not going to have her increase or change any of her therapy directed against respiratory tract inflammation and she will remain on tezepelumab, Advair, montelukast.  She is having reflux and she is having slow swallowing and we will obtain a barium swallow and refer her back to gastroenterology to consider possible esophageal dilation.  It should be noted that she is using Xarelto which may require some manipulation if she is going to undergo an esophageal dilation.  I will contact her with the results of her barium swallow once it is available for review.  Alicia Schimke, MD Allergy / Immunology [facility]

## 2023-02-13 ENCOUNTER — Encounter: Payer: Self-pay | Admitting: Allergy and Immunology

## 2023-02-13 ENCOUNTER — Telehealth: Payer: Self-pay | Admitting: Family Medicine

## 2023-02-13 ENCOUNTER — Ambulatory Visit: Payer: Self-pay

## 2023-02-13 ENCOUNTER — Telehealth: Payer: Self-pay | Admitting: Allergy and Immunology

## 2023-02-13 ENCOUNTER — Other Ambulatory Visit (HOSPITAL_COMMUNITY): Payer: Self-pay

## 2023-02-13 NOTE — Telephone Encounter (Signed)
I called pharmacy 4 times and got through with the pharmacist. Every time I was transferred the call would get disconnected.

## 2023-02-13 NOTE — Patient Outreach (Signed)
  Care Coordination   Follow Up Visit Note   02/13/2023 Name: CHASITITY SAX MRN: [id number] DOB: [birth year]  ANALAYA MILKO is a 77 y.o. year old female who sees Excell Seltzer, MD for primary care. I spoke with  Gwynneth Albright by phone today.  What matters to the patients health and wellness today?  Patient reports cutting the skin area on her right foot approximately 2 weeks ago.  She states she had to go to the ED due to cut area bleeding continuously.  Patient states she is on blood thinners.  Patient reports right toe developed redness and swelling. She reports having several follow up visits with PCP.  Now states currently on 2 antibiotics and  scheduled for visit at wound clinic on 03/04/23.  Patient states she is taking both antibiotics together.  Per review of PCP note instruction states to Complete doxycycline and start additional antibiotic clindamycin for 7 days.  Patient states the counselor she was seeing for her anxiety is no longer at the practice.  She reports having appointment issues with the office and has not rescheduled to see a new counselor at this time.    Goals Addressed             This Visit's Progress    management of feelings of anxiety and health conditions       Interventions Today    Flowsheet Row Most Recent Value  Chronic Disease   Chronic disease during today's visit Other  [cellulitis of right toe, anxiety]  General Interventions   General Interventions Discussed/Reviewed General Interventions Reviewed, Doctor Visits  [evaluation of current treatment plan for right toe cellulitis, anxiety and patients adherence to plan as established by provider. Assessed for signs/ symptoms of infection/ pain and anxiety symptoms.]  Doctor Visits Discussed/Reviewed Doctor Visits Reviewed  Annabell Sabal upcoming provider visits. Confirmed patient has scheduled appointment with wound clinic]  Education Interventions   Education Provided Provided Education  [reviewed  signs/ symptoms of infection.  Continue to keep toe area clean and dry changing dressing 2 x per day as recommended by provider, and wear post op shoe for wound]  Provided Verbal Education On Foot Care  [Reinforced importance of not cutting areas to feet/ toenail.  Advised to follow up with podatrist for foot care needs.]  Mental Health Interventions   Mental Health Discussed/Reviewed Mental Health Reviewed, Anxiety  [Advised patient to consider rescheduling appointment with new counselor]  Pharmacy Interventions   Pharmacy Dicussed/Reviewed Pharmacy Topics Reviewed  [reviewed medications. Advised to contact PCP office to clarify antibiotic order.  Advised to complete antibiotics as instructed by provider.]              SDOH assessments and interventions completed:  No     Care Coordination Interventions:  Yes, provided   Follow up plan: Follow up call scheduled for 03/15/23    Encounter Outcome:  Pt. Visit Completed   George Ina RN,BSN,CCM [facility] Care Coordination [phone] direct line

## 2023-02-13 NOTE — Telephone Encounter (Signed)
Pt called asking [name] is she is suppose to be starting clindamycin (CLEOCIN) 300 MG capsule [[id number]] while currently taking doxycycline (VIBRA-TABS) 100 MG tablet? Call back # [phone]

## 2023-02-13 NOTE — Patient Instructions (Signed)
Visit Information  Thank you for taking time to visit with me today. Please don't hesitate to contact me if I can be of assistance to you.   Following are the goals we discussed today:   Goals Addressed             This Visit's Progress    management of feelings of anxiety and health conditions       Interventions Today    Flowsheet Row Most Recent Value  Chronic Disease   Chronic disease during today's visit Other  [cellulitis of right toe, anxiety]  General Interventions   General Interventions Discussed/Reviewed General Interventions Reviewed, Doctor Visits  [evaluation of current treatment plan for right toe cellulitis, anxiety and patients adherence to plan as established by provider. Assessed for signs/ symptoms of infection/ pain and anxiety symptoms.]  Doctor Visits Discussed/Reviewed Doctor Visits Reviewed  Annabell Sabal upcoming provider visits. Confirmed patient has scheduled appointment with wound clinic]  Education Interventions   Education Provided Provided Education  [reviewed signs/ symptoms of infection.  Continue to keep toe area clean and dry changing dressing 2 x per day as recommended by provider, and wear post op shoe for wound]  Provided Verbal Education On Foot Care  [Reinforced importance of not cutting areas to feet/ toenail.  Advised to follow up with podatrist for foot care needs.]  Mental Health Interventions   Mental Health Discussed/Reviewed Mental Health Reviewed, Anxiety  [Advised patient to consider rescheduling appointment with new counselor]  Pharmacy Interventions   Pharmacy Dicussed/Reviewed Pharmacy Topics Reviewed  [reviewed medications. Advised to contact PCP office to clarify antibiotic order.  Advised to complete antibiotics as instructed by provider.]              Our next appointment is by telephone on 03/15/23 at 1:30 pm  Please call the care guide team at [phone] if you need to cancel or reschedule your appointment.   If you are  experiencing a Mental Health or Behavioral Health Crisis or need someone to talk to, please call the Suicide and Crisis Lifeline: 988 call 1-800-273-TALK (toll free, 24 hour hotline)  Patient verbalizes understanding of instructions and care plan provided today and agrees to view in MyChart. Active MyChart status and patient understanding of how to access instructions and care plan via MyChart confirmed with patient.     George Ina RN,BSN,CCM [facility] Care Coordination [phone] direct line

## 2023-02-13 NOTE — Telephone Encounter (Signed)
[name] from [place] called stating needed verification on prescription pantoprazole (PROTONIX) 40 MG tablet [[id number]] two dosages was found please return call

## 2023-02-14 ENCOUNTER — Ambulatory Visit (INDEPENDENT_AMBULATORY_CARE_PROVIDER_SITE_OTHER): Payer: Medicare Other

## 2023-02-14 DIAGNOSIS — J4550 Severe persistent asthma, uncomplicated: Secondary | ICD-10-CM | POA: Diagnosis not present

## 2023-02-15 ENCOUNTER — Other Ambulatory Visit (HOSPITAL_COMMUNITY): Payer: Self-pay

## 2023-02-15 NOTE — Telephone Encounter (Signed)
I spoke with [name].  She confirms that she did take the Clindamycin the way Dr. Ermalene Searing had instructed her at her last office visit but she states she had spoken with a nurse ( I think it has the care coordinator from [facility]) on 02/13/23 and she told patient she was not suppose to start the clindamycin until after she finished the doxycycline.  So she called to just make sure she was taking it right. I advised she was taking the medication as Dr. Ermalene Searing instructed and to continue.    See update in message below.

## 2023-02-15 NOTE — Telephone Encounter (Signed)
Left message for [name] with information below from Dr. Ermalene Searing.  Looks like patient has appointment with Wound Clinic on 03/04/23 at 8:00 am.  I ask patient to return call to office to give [place] an update on how her wound is looking.

## 2023-02-15 NOTE — Telephone Encounter (Signed)
Patient has returned phone call for Lupita Leash. Patient states that the wound is looking good. She says the "little one" sometimes has blood in the guaze but she changes the dressing as told. Patient also says she is not going anywhere and can receive a call back at any time today.

## 2023-02-15 NOTE — Telephone Encounter (Signed)
She was supposed to start the clindamycin along with the doxycycline the day after receiving the antibiotic injection in the office. I have also placed a referral to the wound care center, has this been set up? Please get an update on how the wound is looking.

## 2023-02-17 NOTE — Progress Notes (Unsigned)
Cardiology Office Note:  .   Date:  02/18/2023  ID:  [name], DOB Jan 20, 1946, MRN [id number] PCP: Excell Seltzer, MD  [facility] Providers Cardiologist:  Thurmon Fair, MD    History of Present Illness: Marland Kitchen   [name] is a 77 y.o. female with past medical history of atrial flutter s/p DCCV in 11/2020, paroxysmal atrial fibrillation, chronic diastolic heart failure, remote DVT/PE, moderate pulmonary hypertension, hypertension, hyperlipidemia, OSA on CPAP, type 2 diabetes, hyperthyroidism, chronic diarrhea, chronic reactive airway disease, DJD, seizures and breast cancer who presents for follow-up related to atrial fibrillation/atrial flutter.   Remote coronary angiography showed no evidence of CAD though she did have moderate pulmonary artery hypertension on RHC (systolic PAP ).  In 2019 she had a Lexiscan Myoview that was low risk, no evidence of ischemia.  Remote history of atrial fibrillation.  Echocardiogram in April 2022 showed EF 60 to 65%, moderate asymmetric LVH of the basal septal segment, indeterminate diastolic parameters, mildly elevated PASP.  She was noted to be in atrial flutter at that time and underwent DCCV in May 2022.  She was hospitalized in 12/2021 in the setting of acute calculus cholecystitis with sepsis.  She underwent laparoscopic cholecystectomy.  She was last seen in the office on 12/28/2021 and was stable from a cardiac standpoint she presented to the ED on 01/22/2023 with concern for bleeding after cutting her right toe with a razor blade (she was attempting to remove dead skin and cut too deep).  She was discharged in stable condition following control of bleeding.  She was seen on 01/23/2023 by Bernadene Person, NP.  Patient reported feeling well although it was noted on her EKG that she was in atrial flutter.  She was asymptomatic, euvolemic and well compensated on exam.  She reported missing a dose of her Xarelto the week prior.  She was instructed to  return to clinic in 3 weeks for consideration of DCCV if not self converted to normal sinus rhythm.  She saw Dr. Lucie Leather, pulmonary on 02/12/23 noting increased sneezing and coughing as well as postprandial chest pain associated with worsened reflux and swallowing. It was recommended she follow up with GI for slow swallowing and obtain a barium swallow study.   Today she reports that she is doing well overall.  She notes that she has not been compliant with her daily Xarelto, notes that she vomited after taking her medications 1 week ago.  She did not take her medications the following day due to GI pain.  She notes that her pulmonologist is to refer her to GI for workup regarding this.  From a cardiac standpoint she has remained stable, she denies apnea, palpitations, edema, PND, orthopnea or weight gain. She does note some chest pain after taking her medications or after eating that she associates with GI issues.  She denies chest pain with exertion.  ROS: Today she denies chest pain, shortness of breath, lower extremity edema, fatigue, palpitations, melena, hematuria, hemoptysis, diaphoresis, weakness, presyncope, syncope, orthopnea, and PND.   Studies Reviewed: Marland Kitchen   EKG Interpretation Date/Time:  Monday February 18 2023 13:48:09 EDT Ventricular Rate:  97 PR Interval:    QRS Duration:  108 QT Interval:  330 QTC Calculation: 419 R Axis:   -52  Text Interpretation: Atrial flutter with variable A-V block Left axis deviation Incomplete right bundle branch block Confirmed by Reather Littler [phone]) on 02/18/2023 2:20:14 PM   Cardiac Studies & Procedures  ECHOCARDIOGRAM  ECHOCARDIOGRAM COMPLETE 11/07/2020  Narrative ECHOCARDIOGRAM REPORT    Patient Name:   [name] Date of Exam: 11/07/2020 Medical Rec #:  [id number]       Height:       64.0 in Accession #:    [id number]      Weight:       199.2 lb Date of Birth:  03/16/1946       BSA:          1.953 m Patient Age:    75 years        BP:            90/54 mmHg Patient Gender: F               HR:           105 bpm. Exam Location:  [address]  Procedure: 2D Echo, Cardiac Doppler and Color Doppler  Indications:    I50.30 CHF  History:        Patient has prior history of Echocardiogram examinations, most recent 04/20/2015. CHF, Pulmonary HTN, Arrythmias:Atrial Fibrillation; Risk Factors:Hypertension and Diabetes.  Sonographer:    Samule Ohm RDCS Referring Phys: [facility]  IMPRESSIONS   1. Rhythm appears to be atrial flutter with variable conduction. Left ventricular ejection fraction, by estimation, is 60 to 65%. The left ventricle has normal function. The left ventricle has no regional wall motion abnormalities. There is moderate asymmetric left ventricular hypertrophy of the basal-septal segment. Left ventricular diastolic parameters are indeterminate. 2. Right ventricular systolic function is normal. The right ventricular size is normal. There is mildly elevated pulmonary artery systolic pressure. The estimated right ventricular systolic pressure is 44.0 mmHg. 3. Left atrial size was mildly dilated. 4. Right atrial size was moderately dilated. 5. The mitral valve is normal in structure. Trivial mitral valve regurgitation. No evidence of mitral stenosis. 6. Tricuspid valve regurgitation is moderate. 7. The aortic valve is tricuspid. Aortic valve regurgitation is not visualized. No aortic stenosis is present. 8. The inferior vena cava is normal in size with greater than 50% respiratory variability, suggesting right atrial pressure of 3 mmHg.  FINDINGS Left Ventricle: Left ventricular ejection fraction, by estimation, is 60 to 65%. The left ventricle has normal function. The left ventricle has no regional wall motion abnormalities. The left ventricular internal cavity size was normal in size. There is moderate asymmetric left ventricular hypertrophy of the basal-septal segment. Left ventricular diastolic  parameters are indeterminate.  Right Ventricle: The right ventricular size is normal. No increase in right ventricular wall thickness. Right ventricular systolic function is normal. There is mildly elevated pulmonary artery systolic pressure. The tricuspid regurgitant velocity is 3.20 m/s, and with an assumed right atrial pressure of 3 mmHg, the estimated right ventricular systolic pressure is 44.0 mmHg.  Left Atrium: Left atrial size was mildly dilated.  Right Atrium: Right atrial size was moderately dilated.  Pericardium: There is no evidence of pericardial effusion.  Mitral Valve: The mitral valve is normal in structure. Trivial mitral valve regurgitation. No evidence of mitral valve stenosis.  Tricuspid Valve: The tricuspid valve is normal in structure. Tricuspid valve regurgitation is moderate.  Aortic Valve: The aortic valve is tricuspid. Aortic valve regurgitation is not visualized. No aortic stenosis is present.  Pulmonic Valve: The pulmonic valve was not well visualized. Pulmonic valve regurgitation is not visualized.  Aorta: The aortic root and ascending aorta are structurally normal, with no evidence of dilitation.  Venous: The inferior vena cava is  normal in size with greater than 50% respiratory variability, suggesting right atrial pressure of 3 mmHg.  IAS/Shunts: No atrial level shunt detected by color flow Doppler.   LEFT VENTRICLE PLAX 2D LVIDd:         4.20 cm  Diastology LVIDs:         2.70 cm  LV e' medial:    10.13 cm/s LV PW:         0.90 cm  LV E/e' medial:  11.5 LV IVS:        1.30 cm  LV e' lateral:   13.13 cm/s LVOT diam:     2.00 cm  LV E/e' lateral: 8.8 LV SV:         58 LV SV Index:   30 LVOT Area:     3.14 cm   RIGHT VENTRICLE             IVC RV S prime:     12.00 cm/s  IVC diam: 1.90 cm TAPSE (M-mode): 2.2 cm RVSP:           44.0 mmHg  LEFT ATRIUM             Index       RIGHT ATRIUM           Index LA diam:        4.00 cm 2.05 cm/m  RA  Pressure: 3.00 mmHg LA Vol (A2C):   52.4 ml 26.83 ml/m RA Area:     25.20 cm LA Vol (A4C):   71.9 ml 36.81 ml/m RA Volume:   83.10 ml  42.55 ml/m LA Biplane Vol: 67.1 ml 34.36 ml/m AORTIC VALVE LVOT Vmax:   102.56 cm/s LVOT Vmean:  72.040 cm/s LVOT VTI:    0.184 m  AORTA Ao Root diam: 2.70 cm Ao Asc diam:  3.60 cm  MV E velocity: 116.20 cm/s  TRICUSPID VALVE TR Peak grad:   41.0 mmHg TR Vmax:        320.00 cm/s Estimated RAP:  3.00 mmHg RVSP:           44.0 mmHg  SHUNTS Systemic VTI:  0.18 m Systemic Diam: 2.00 cm  Epifanio Lesches MD Electronically signed by Epifanio Lesches MD Signature Date/Time: 11/07/2020/10:22:15 PM    Final             Risk Assessment/Calculations:    CHA2DS2-VASc Score = 6   This indicates a 9.7% annual risk of stroke. The patient's score is based upon: CHF History: 1 HTN History: 1 Diabetes History: 1 Stroke History: 0 Vascular Disease History: 0 Age Score: 2 Gender Score: 1            Physical Exam:   VS:  BP 114/68   Pulse 75   Ht 5\' 4"  (1.626 m)   Wt 198 lb (89.8 kg)   SpO2 97%   BMI 33.99 kg/m    Wt Readings from Last 3 Encounters:  02/18/23 198 lb (89.8 kg)  02/12/23 204 lb (92.5 kg)  02/08/23 201 lb 6 oz (91.3 kg)    GEN: Well nourished, well developed in no acute distress NECK: No JVD; No carotid bruits CARDIAC: Irregular RR, no murmurs, rubs, gallops RESPIRATORY:  Clear to auscultation without rales, wheezing or rhonchi  ABDOMEN: Soft, non-tender, non-distended EXTREMITIES:  No edema; No deformity   ASSESSMENT AND PLAN: .    Atypical atrial flutter/paroxysmal atrial fibrillation: S/p DCCV in 11/2020. Noted at office visit on 01/23/23 to be in rate  controlled atrial flutter. EKG today indicates atrial flutter at 97 bpm. On pulse recheck her heart rate of 75 bpm. She remains cardiac unaware, experiencing no shortness of breath or palpitations.  Discussed possible cardioversion with Dr. Royann Shivers which he  was in agreement with.  However as she has not taken her Xarelto daily for the last 3 weeks she is not currently a candidate.  Patient notes that she is unable to feel her atrial flutter and feels no different than when she is in normal sinus rhythm.  She appears euvolemic and well compensated on exam today.  At this time she would prefer to have her GI problems investigated prior to cardioversion. Encouraged adherence to Xarelto and monitoring of heart rate. Reviewed ED precautions. Continue Xarelto.   Difficulty swallowing: Reports recent problems with swallowing, feeling that her medications are becoming stuck in her throat, resulting in pain and vomiting.  Her pulmonologist is to refer her to GI for further workup.  Chronic diastolic heart failure: Echocardiogram in April 2022 showed EF 60 to 65%, moderate asymmetric LVH of the basal septal segment, indeterminate diastolic parameters, mildly elevated PASP.  She is euvolemic and well compensated on exam today.  Previously on Lasix as needed, she is no longer taking, no indication this time.   Hypertension: Blood pressure today 114/68. Continue current antihypertensive regimen.   Hyperlipidemia: Last lipid profile in October 2023 indicated LDL of 49.  Continue Lipitor.  Pulmonary hypertension: Felt to likely be multifactorial related to diastolic dysfunction, previous pulmonary embolism, longstanding obesity.  Noted to be mild on most recent echo with estimated PASP of 44 mmHg.  She is currently asymptomatic.  H/O DVT/PE: She is on chronic anticoagulation given history of atrial arrhythmias.  No recurrence.  Type 2 diabetes: A1C was 5.6 in October 2023.  Monitored and managed per PCP.  OSA: Reports that she has been in touch with neurology in order to obtain a new sleep mask.  Encouraged compliance with CPAP.  Chronic asthma: Denies any recent increase shortness of breath.  Follows with Dr. Lucie Leather.   Hyperthyroidism: In 06/2022 TSH was 2.96.   Controlled on chronic methimazole.        Dispo: Follow up as scheduled with Dr. Royann Shivers in October, 2024.   Signed, Rip Harbour, NP

## 2023-02-18 ENCOUNTER — Ambulatory Visit: Payer: Medicare Other | Admitting: Cardiology

## 2023-02-18 ENCOUNTER — Encounter: Payer: Self-pay | Admitting: Nurse Practitioner

## 2023-02-18 ENCOUNTER — Encounter: Payer: Self-pay | Admitting: Family Medicine

## 2023-02-18 ENCOUNTER — Telehealth: Payer: Self-pay | Admitting: Allergy and Immunology

## 2023-02-18 VITALS — BP 114/68 | HR 75 | Ht 64.0 in | Wt 198.0 lb

## 2023-02-18 DIAGNOSIS — I484 Atypical atrial flutter: Secondary | ICD-10-CM | POA: Diagnosis not present

## 2023-02-18 DIAGNOSIS — I10 Essential (primary) hypertension: Secondary | ICD-10-CM | POA: Diagnosis not present

## 2023-02-18 DIAGNOSIS — G4733 Obstructive sleep apnea (adult) (pediatric): Secondary | ICD-10-CM | POA: Diagnosis not present

## 2023-02-18 DIAGNOSIS — E0590 Thyrotoxicosis, unspecified without thyrotoxic crisis or storm: Secondary | ICD-10-CM

## 2023-02-18 DIAGNOSIS — Z86711 Personal history of pulmonary embolism: Secondary | ICD-10-CM | POA: Diagnosis not present

## 2023-02-18 DIAGNOSIS — E785 Hyperlipidemia, unspecified: Secondary | ICD-10-CM | POA: Diagnosis not present

## 2023-02-18 DIAGNOSIS — I480 Paroxysmal atrial fibrillation: Secondary | ICD-10-CM | POA: Diagnosis not present

## 2023-02-18 DIAGNOSIS — J4540 Moderate persistent asthma, uncomplicated: Secondary | ICD-10-CM | POA: Diagnosis not present

## 2023-02-18 DIAGNOSIS — I2721 Secondary pulmonary arterial hypertension: Secondary | ICD-10-CM

## 2023-02-18 DIAGNOSIS — Z7901 Long term (current) use of anticoagulants: Secondary | ICD-10-CM | POA: Diagnosis not present

## 2023-02-18 DIAGNOSIS — E1140 Type 2 diabetes mellitus with diabetic neuropathy, unspecified: Secondary | ICD-10-CM

## 2023-02-18 DIAGNOSIS — I272 Pulmonary hypertension, unspecified: Secondary | ICD-10-CM | POA: Diagnosis not present

## 2023-02-18 DIAGNOSIS — I5032 Chronic diastolic (congestive) heart failure: Secondary | ICD-10-CM | POA: Diagnosis not present

## 2023-02-18 NOTE — Telephone Encounter (Signed)
Patient is requesting a call from Dr. Lucie Leather. She has a couple of questions regarding something her and Dr. Lucie Leather talked about at her OV last week. She was having a lot of chest pain over the weekend and issues with her swallowing. She is going to the Cardiologist this week.

## 2023-02-18 NOTE — Patient Instructions (Signed)
Medication Instructions:  Your physician recommends that you continue on your current medications as directed. Please refer to the Current Medication list given to you today.  *If you need a refill on your cardiac medications before your next appointment, please call your pharmacy*   Lab Work: NONE ordered at this time of appointment    Testing/Procedures: NONE ordered at this time of appointment     Follow-Up: At [facility], you and your health needs are our priority.  As part of our continuing mission to provide you with exceptional heart care, we have created designated Provider Care Teams.  These Care Teams include your primary Cardiologist (physician) and Advanced Practice Providers (APPs -  Physician Assistants and Nurse Practitioners) who all work together to provide you with the care you need, when you need it.  We recommend signing up for the patient portal called "MyChart".  Sign up information is provided on this After Visit Summary.  MyChart is used to connect with patients for Virtual Visits (Telemedicine).  Patients are able to view lab/test results, encounter notes, upcoming appointments, etc.  Non-urgent messages can be sent to your provider as well.   To learn more about what you can do with MyChart, go to [email or web address].    Your next appointment:    Keep follow up   Provider:   Thurmon Fair, MD

## 2023-02-19 ENCOUNTER — Telehealth: Payer: Self-pay | Admitting: Family Medicine

## 2023-02-19 DIAGNOSIS — N3944 Nocturnal enuresis: Secondary | ICD-10-CM | POA: Diagnosis not present

## 2023-02-19 DIAGNOSIS — N3941 Urge incontinence: Secondary | ICD-10-CM | POA: Diagnosis not present

## 2023-02-19 NOTE — Telephone Encounter (Signed)
I do not think it is wise to drive even if she takes off the boot given her current foot issues and likely slow reaction time.

## 2023-02-19 NOTE — Telephone Encounter (Signed)
Pt called asking Dr. Ermalene Searing if she thinks its wise for her to drive if she were to remove the boot off her foot? Call back # [phone]

## 2023-02-20 MED ORDER — ONDANSETRON 4 MG PO TBDP: 4.0000 mg | ORAL_TABLET | Freq: Three times a day (TID) | ORAL | 0 refills | Status: DC | PRN

## 2023-02-20 NOTE — Telephone Encounter (Addendum)
Left message for [name] that Dr. Suzan Garibaldi not think it is a good idea for her to drive at this time given foot issue and possible slow reaction time.  I ask that she call me back if she has any questions.  I also sent this information to patient's MyChart.

## 2023-02-20 NOTE — Telephone Encounter (Signed)
Zofran ODT 8 mg is on medication list.   If okay to send in 4 mg, please sign order below.

## 2023-02-21 ENCOUNTER — Telehealth: Payer: Self-pay

## 2023-02-21 NOTE — Telephone Encounter (Signed)
-----   Message from ERIC J KOZLOW sent at 02/18/2023  5:21 PM EDT ----- During last visit the following was noted on the orders:  8. Revisit with GI doctor about slow swallowing   9. Obtain a barium swallow for slow swallowing  Please arrange a barium swallow THIS WEEK  Please refer her to any [facility] doctor THIS WEEK for reflux / esophageal dysmotility.  Please let me know THIS WEEK about these two orders.

## 2023-02-21 NOTE — Telephone Encounter (Signed)
Called [phone] - spoke to [place], [place] - DOB verified - stated no Prior Auth/Pre Cert required for Barium Swallow Test (Esophagram) - CPT: 74220, [phone] ICD: K22.4, K21.9.  Called [facility] to schedule Barium Swallow Test - LMOVM to contact our office.  Rh[facility] returned called - advised to contact Centralized Scheduling @ [phone] - [phone] to scheduled test.  Called Centralized Scheduling/[place] - DOB verified -   Date: 02/22/23 Location: [facility] Address: [address] [place], [place] [address] Time: 11:00 am Arrival: 09:30 am Restrictions: NONE  Called patient  - DOB verified - advised of above notation and gave Centralized Scheduling contact information in case she needs to change appointment.  Forwarding message to provider as update.  Forwarding message to [place] Admin Pool - STAT referral to [facility]/ Any Provider - Dx: Reflux; Esophageal Dysmotility.

## 2023-02-22 ENCOUNTER — Ambulatory Visit (HOSPITAL_COMMUNITY)
Admission: RE | Admit: 2023-02-22 | Discharge: 2023-02-22 | Disposition: A | Discharge status: Home / Self Care | Payer: Medicare Other | Source: Ambulatory Visit | Attending: Allergy and Immunology | Admitting: Allergy and Immunology

## 2023-02-22 DIAGNOSIS — R1310 Dysphagia, unspecified: Secondary | ICD-10-CM | POA: Diagnosis not present

## 2023-02-22 DIAGNOSIS — K224 Dyskinesia of esophagus: Secondary | ICD-10-CM | POA: Insufficient documentation

## 2023-02-22 NOTE — Telephone Encounter (Signed)
Called and spoke to patient and she expressed that she is just leaving the hospital from her barium swallow test. Patient expressed that she physician at the hospital expressed that she was fine and that there will be no surgeries. Patient stated that she is going home to rest now and speak with her PCP for what to do moving forward as well as her going keep her follow up with her cardiologist.

## 2023-02-25 NOTE — Telephone Encounter (Addendum)
I contacted [facility] (On Hold 25 Minutes)  and they informed me the patient has seen Dr. Kerin Salen with [facility]. They said the patient can be seen with them if she request for her records to be transferred to their office from Dr. Kerin Salen. Their office can not schedule until then.  I called Dr. Kerin Salen [facility]) office and was told that I have to fax over an Urgent Referral for the provider to review and see if she feels the patient needs to be seen urgently as she is booking out to mid October at this time.   Referral Notes and Barium Swallow Test has been faxed to their office.   Patient has been informed of the referral being placed.

## 2023-02-27 ENCOUNTER — Telehealth: Payer: Self-pay | Admitting: Family Medicine

## 2023-02-27 NOTE — Telephone Encounter (Signed)
Left message for [name] to return my call to discuss her toe pain.

## 2023-02-27 NOTE — Telephone Encounter (Signed)
Patient is requesting a call back whenever possible, in regards to pain she is having in her toe. Can be reached at home number

## 2023-02-27 NOTE — Telephone Encounter (Signed)
Spoke with [name].  She states her 2nd toe on her right foot is sore and she though it looked a little black last time she took off her bandage.  She is asking  if there is anything else she can use on it other than Neosporin.  She is not scheduled to see the Wound Center until 03/04/23.  She did state she has stopped wearing her bed room slippers.  I ask her when she is just sitting around the house, does she every just leave the bandage off so the area can get some air.  She states she has not done that but will try to do that the rest of the day and see how things look/feel tomorrow.  If it is still bothering her tomorrow, she will call for an appointment.

## 2023-02-28 DIAGNOSIS — K224 Dyskinesia of esophagus: Secondary | ICD-10-CM | POA: Diagnosis not present

## 2023-02-28 NOTE — Telephone Encounter (Signed)
Noted.  At last appointment had a.  Infection resolved but given diabetes and persistent range, she was referred to wound center... Appointment upcoming. Given location of discoloration and poor wound healing we can consider evaluation for peripheral artery disease.

## 2023-03-01 ENCOUNTER — Other Ambulatory Visit: Payer: Self-pay | Admitting: Gastroenterology

## 2023-03-04 ENCOUNTER — Encounter (HOSPITAL_BASED_OUTPATIENT_CLINIC_OR_DEPARTMENT_OTHER): Payer: Medicare Other | Attending: Internal Medicine | Admitting: Internal Medicine

## 2023-03-04 DIAGNOSIS — G40909 Epilepsy, unspecified, not intractable, without status epilepticus: Secondary | ICD-10-CM | POA: Insufficient documentation

## 2023-03-04 DIAGNOSIS — Z86718 Personal history of other venous thrombosis and embolism: Secondary | ICD-10-CM | POA: Insufficient documentation

## 2023-03-04 DIAGNOSIS — L03031 Cellulitis of right toe: Secondary | ICD-10-CM | POA: Diagnosis not present

## 2023-03-04 DIAGNOSIS — M1990 Unspecified osteoarthritis, unspecified site: Secondary | ICD-10-CM | POA: Insufficient documentation

## 2023-03-04 DIAGNOSIS — I110 Hypertensive heart disease with heart failure: Secondary | ICD-10-CM | POA: Diagnosis not present

## 2023-03-04 DIAGNOSIS — Z7901 Long term (current) use of anticoagulants: Secondary | ICD-10-CM | POA: Insufficient documentation

## 2023-03-04 DIAGNOSIS — J45909 Unspecified asthma, uncomplicated: Secondary | ICD-10-CM | POA: Diagnosis not present

## 2023-03-04 DIAGNOSIS — I480 Paroxysmal atrial fibrillation: Secondary | ICD-10-CM | POA: Diagnosis not present

## 2023-03-04 DIAGNOSIS — E11621 Type 2 diabetes mellitus with foot ulcer: Secondary | ICD-10-CM | POA: Diagnosis not present

## 2023-03-04 DIAGNOSIS — I272 Pulmonary hypertension, unspecified: Secondary | ICD-10-CM | POA: Insufficient documentation

## 2023-03-04 DIAGNOSIS — L84 Corns and callosities: Secondary | ICD-10-CM | POA: Diagnosis not present

## 2023-03-04 DIAGNOSIS — I509 Heart failure, unspecified: Secondary | ICD-10-CM | POA: Insufficient documentation

## 2023-03-04 DIAGNOSIS — L97512 Non-pressure chronic ulcer of other part of right foot with fat layer exposed: Secondary | ICD-10-CM | POA: Insufficient documentation

## 2023-03-04 DIAGNOSIS — G4733 Obstructive sleep apnea (adult) (pediatric): Secondary | ICD-10-CM | POA: Diagnosis not present

## 2023-03-07 ENCOUNTER — Telehealth: Payer: Self-pay | Admitting: Family Medicine

## 2023-03-07 NOTE — Telephone Encounter (Signed)
Unable to each pt at home or cell and left V/M s for pt to cb [phone]. Sending note to [facility] triage and [facility] pool.

## 2023-03-07 NOTE — Telephone Encounter (Signed)
Pt called in requesting a call back to discuss ter toes . Please advise # [phone]

## 2023-03-07 NOTE — Telephone Encounter (Signed)
Closing, duplicate phone note

## 2023-03-07 NOTE — Telephone Encounter (Signed)
Glad to hear her toes are improving. I recommend her getting the flu shot as soon as it is available.

## 2023-03-07 NOTE — Telephone Encounter (Signed)
Please triage

## 2023-03-07 NOTE — Telephone Encounter (Signed)
Patient stated she missed the call from our office. Would like a cal back.

## 2023-03-07 NOTE — Telephone Encounter (Signed)
Returned pt's phone call.  Pt wanted Dr. Ermalene Searing to know that her toes were better after seeing the wound doctor.  She also wanted to know if she should wait until her appointment 05/03/23 to take her flu shot.  Please advise.

## 2023-03-08 NOTE — Telephone Encounter (Signed)
[name] notified as instructed by telephone.  Patient states understanding. 

## 2023-03-11 NOTE — Progress Notes (Unsigned)
PATIENT: [name] DOB: 1946-01-07  REASON FOR VISIT: follow up HISTORY FROM: patient  No chief complaint on file.    HISTORY OF PRESENT ILLNESS: Today 03/11/23:  [name] is a 77 y.o. female with a history of OSA on CPAP. Returns today for follow-up.      03/14/22: [name] is a 76 year old female with a history of obstructive sleep apnea on CPAP.  She returns today for follow-up.  She reports that there are some nights that she does not use the machine due to frequent trips to the bathroom.  Otherwise she does notice the benefit when she uses the machine.  She was recently diagnosed with breast cancer and will have surgery later this month.  Her download is below    12/22/20: [name] is a 77 year old female with a history of obstructive sleep apnea on CPAP.  She reports that the CPAP is working well for her.  She denies any new issues.  She joins me today for follow-up.    04/13/20: [name] is a 77 year old female with a history of obstructive sleep apnea on CPAP.  Her download indicates that she used the machine 22 out of 30 days for compliance of 73%.  She used her machine greater than 4 hours 17 days for compliance of 57%.  On average she uses her machine 5 hours and 6 minutes.  Her residual AHI is 1.3 on 5 to 10 cm of water with EPR of 3.  Her leak in the 95th percentile is 31.6 L/min. She returns today for follow-up.  HISTORY 03/05/19:   [name] is a 77 year old female with a history of obstructive sleep apnea on CPAP.  Her download indicates that she used the machine 22 out of 30 days for compliance of 73%.  She used her machine greater than 4 hours 21 days for compliance of 70%.  On average she uses her machine 6 hours and 5 minutes.  Her residual AHI is 1.6 on 5 to 10 cm of water with EPR of 3.  Her leak in the 95th percentile is 29.7 L/min.  REVIEW OF SYSTEMS: Out of a complete 14 system review of symptoms, the patient complains only of the  following symptoms, and all other reviewed systems are negative.  ESS 5  ALLERGIES: Allergies  Allergen Reactions   Dilantin [Phenytoin] Swelling    facial swelling   Latex Hives   Oxycodone Nausea And Vomiting and Nausea Only    Abdominal Pain, Vomiting   Penicillins Nausea And Vomiting and Swelling    Has patient had a PCN reaction causing immediate rash, facial/tongue/throat swelling, SOB or lightheadedness with hypotension patient had a PCN reaction causing severe rash involving mucus membranes or skin necrosis: [id number] Has patient had a PCN reaction that required hospitalization/No Has patient had a PCN reaction occurring within the last 10 years: No If all of the above answers are "NO", then may proceed with Cephalosporin use.      Codeine Nausea Only    Tolerates tylenol with codeine   Hydrocodone Nausea And Vomiting   Nickel Hives and Rash   Pregabalin Itching and Other (See Comments)    headaches/problem w/vision    HOME MEDICATIONS: Outpatient Medications Prior to Visit  Medication Sig Dispense Refill   ADVAIR HFA 230-21 MCG/ACT inhaler Inhale 2 puffs into the lungs 2 (two) times daily. 2 puffs 1-2 times daily depending on disease activity. 36 g 1   albuterol (PROVENTIL) (2.5 MG/3ML) 0.083%  nebulizer solution Take 3 mLs (2.5 mg total) by nebulization every 6 (six) hours as needed for wheezing or shortness of breath. 150 mL 1   albuterol (VENTOLIN HFA) 108 (90 Base) MCG/ACT inhaler INHALE 2 PUFFS BY MOUTH EVERY 6 HOURS AS NEEDED FOR WHEEZING OR SHORTNESS OF BREATH 18 g 1   anastrozole (ARIMIDEX) 1 MG tablet TAKE 1 TABLET(1 MG) BY MOUTH DAILY 90 tablet 3   atorvastatin (LIPITOR) 10 MG tablet TAKE 1 TABLET(10 MG) BY MOUTH DAILY 90 tablet 3   Blood Glucose Monitoring Suppl (ONETOUCH VERIO) w/Device KIT Use to check blood sugar up to 2 times a day 1 kit 0   clindamycin (CLEOCIN) 300 MG capsule Take 1 capsule (300 mg total) by mouth 3 (three) times daily. 21 capsule 0    cyclobenzaprine (FLEXERIL) 10 MG tablet Take 0.5-1 tablets (5-10 mg total) by mouth at bedtime as needed for muscle spasms. 20 tablet 0   desloratadine (CLARINEX) 5 MG tablet TAKE 1 TABLET(5 MG) BY MOUTH DAILY 90 tablet 1   EPINEPHrine 0.3 mg/0.3 mL IJ SOAJ injection USE AS DIRECTED FOR LIFE THREATENING ALLERGIC REACTIONS 2 each 1   estradiol (ESTRACE) 0.1 MG/GM vaginal cream Place 1 Applicatorful vaginally 2 (two) times a week. (As Needed)     famotidine (PEPCID) 40 MG tablet Take 1 (ONE) tablet by mouth every evening. 90 tablet 1   fluconazole (DIFLUCAN) 150 MG tablet Take 150 mg by mouth once.     fluticasone (FLOVENT HFA) 220 MCG/ACT inhaler Inhale 2 puffs into the lungs 2 (two) times daily. 2 puffs 2 times daily during flare up. 36 g 1   gabapentin (NEURONTIN) 300 MG capsule Take one capsule in the morning, one at lunch and two at bedtime 360 capsule 3   glucose blood (ONETOUCH VERIO) test strip 1 strip 2 (two) times daily     GLYXAMBI 10-5 MG TABS TAKE 1 TABLET BY MOUTH DAILY 30 tablet 5   hydrOXYzine (ATARAX/VISTARIL) 10 MG tablet Take 1 tablet (10 mg total) by mouth daily as needed for itching. 30 tablet 2   ipratropium (ATROVENT) 0.06 % nasal spray Place 2 sprays into both nostrils 2 (two) times daily as needed for rhinitis. 45 mL 1   irbesartan (AVAPRO) 150 MG tablet TAKE 1 TABLET(150 MG) BY MOUTH DAILY 90 tablet 2   Lactase (DAIRY-RELIEF PO) Take 1 capsule by mouth daily as needed (eating dairy).      Lancets (ONETOUCH DELICA PLUS LANCET33G) MISC USE TO CHECK BLOOD SUGAR UP TO TWICE DAILY 100 each 5   levalbuterol (XOPENEX) 1.25 MG/3ML nebulizer solution USE 1 VIAL VIA NEBULIZER EVERY 6 HOURS AS NEEDED FOR SHORTNESS OF BREATH OR WHEEZING 90 mL 3   Magnesium 500 MG TABS Take 500 mg by mouth daily.     Menthol, Topical Analgesic, (ABSORBINE PLUS JR EX) Apply 1 patch topically daily as needed (pain).     Menthol-Methyl Salicylate (SALONPAS JET SPRAY EX) Apply 1 spray topically daily as  needed (knee pain).     methimazole (TAPAZOLE) 5 MG tablet Take 1 tablet (5 mg total) by mouth 2 (two) times a week.     montelukast (SINGULAIR) 10 MG tablet Take 1 tablet (10 mg total) by mouth at bedtime. For asthma control. 90 tablet 1   Mouthwashes (BIOTENE DRY MOUTH MT) Use as directed 1 Dose in the mouth or throat daily as needed (dry mouth).     Nebulizer MISC 1 Device by Does not apply route as directed. 1  each 1   ondansetron (ZOFRAN-ODT) 4 MG disintegrating tablet Take 1 tablet (4 mg total) by mouth every 8 (eight) hours as needed for nausea or vomiting. 20 tablet 0   ondansetron (ZOFRAN-ODT) 8 MG disintegrating tablet Take 1 tablet (8 mg total) by mouth every 8 (eight) hours as needed for nausea or vomiting. 20 tablet 0   pantoprazole (PROTONIX) 40 MG tablet Take 1 tablet (40 mg total) by mouth 2 (two) times daily. Take 1 (ONE) tablet by mouth every morning. 180 tablet 1   Plecanatide (TRULANCE) 3 MG TABS Take 3 mg by mouth daily as needed (constipation).     Polyethyl Glycol-Propyl Glycol (SYSTANE OP) Place 1 drop into both eyes daily as needed (dry eyes).     polyethylene glycol powder (GLYCOLAX/MIRALAX) 17 GM/SCOOP powder Take 1 Container by mouth once.     polyethylene glycol-electrolytes (NULYTELY) 420 g solution Take 4,000 mLs by mouth once.     PRESCRIPTION MEDICATION Inhale into the lungs at bedtime. CPAP     Respiratory Therapy Supplies (NEBULIZER MASK ADULT) MISC 1 kit by Does not apply route as directed. 1 each 1   Tiotropium Bromide Monohydrate (SPIRIVA RESPIMAT) 1.25 MCG/ACT AERS Inhale 1 Inhalation into the lungs daily. (Patient not taking: Reported on 02/18/2023) 4 g 5   tolterodine (DETROL LA) 4 MG 24 hr capsule Take 4 mg by mouth at bedtime.     triamcinolone (NASACORT) 55 MCG/ACT AERO nasal inhaler Place 2 sprays into the nose daily. 50.7 mL 1   XARELTO 20 MG TABS tablet TAKE 1 TABLET(20 MG) BY MOUTH DAILY WITH SUPPER 90 tablet 1   Facility-Administered Medications Prior  to Visit  Medication Dose Route Frequency Provider Last Rate Last Admin   tezepelumab-ekko (TEZSPIRE) 210 MG/1. syringe 210 mg  210 mg Subcutaneous Q28 days Jessica Priest, MD   210 mg at 02/14/23 1027    PAST MEDICAL HISTORY: Past Medical History:  Diagnosis Date   Allergic rhinitis    Allergy    Arthritis    Asthma    Breast cancer (HCC)    Chronic headache    Diabetes mellitus    Ductal carcinoma in situ (DCIS) of left breast 02/23/2022   DVT (deep venous thrombosis) (HCC)    Dyspnea    Heart murmur    History of radiation therapy    Left Breast 05/02/22-05/30/22- Dr. Antony Blackbird   Hypertension    Penicillin allergy 01/19/2020   Pulmonary embolism (HCC)    Pulmonary hypertension (HCC)    Seizures (HCC)     PAST SURGICAL HISTORY: Past Surgical History:  Procedure Laterality Date   ABDOMINAL HYSTERECTOMY     partial, has ovaries   BREAST LUMPECTOMY WITH RADIOACTIVE SEED LOCALIZATION Left 03/29/2022   Procedure: LEFT BREAST LUMPECTOMY WITH RADIOACTIVE SEED LOCALIZATION;  Surgeon: Harriette Bouillon, MD;  Location: [facility] OR;  Service: General;  Laterality: Left;   CARDIAC CATHETERIZATION  12/28/2010   Mod. pulmonary hypertension, normal coronary arteries   CARDIOVERSION N/A 11/23/2020   Procedure: CARDIOVERSION;  Surgeon: Thurmon Fair, MD;  Location: [facility] ENDOSCOPY;  Service: Cardiovascular;  Laterality: N/A;   CHOLECYSTECTOMY N/A 12/20/2021   Procedure: LAPAROSCOPIC CHOLECYSTECTOMY WITH INTRAOPERATIVE CHOLANGIOGRAM;  Surgeon: Darnell Level, MD;  Location: [facility] ORS;  Service: General;  Laterality: N/A;   DOPPLER ECHOCARDIOGRAPHY  10/08/2011   EF=>55%,mild asymmetric LVH, mod. TR, mod. PH, mild to mod LA dilatation   IR LUMBAR DISC ASPIRATION W/IMG GUIDE  01/12/2020   KNEE ARTHROSCOPY Left    KNEE  SURGERY     Nuclear Stress Test  05/20/2006   No ischemia   PARTIAL HYSTERECTOMY     PLANTAR FASCIA SURGERY     TONSILLECTOMY      FAMILY HISTORY: Family History  Problem Relation  Age of Onset   Cancer Mother        breast and colon cancer   Hypertension Mother    Clotting disorder Mother    Breast cancer Mother 6   Arthritis Mother    Stroke Mother    Diabetes Mother    Colon cancer Mother 20 - 45   Alcohol abuse Father    Cancer Sister        breast cancer   Multiple sclerosis Sister    Cancer Sister        colon cancer   Cancer Brother        colon cancer   Prostate cancer Half-Brother    Stomach cancer Half-Brother    Breast cancer Half-Sister 11       recurrence at 62, reports positive genetic testing   Arthritis Half-Sister    Diabetes Half-Sister    Colon cancer Half-Sister 15   Allergies Other        grandson   Allergic rhinitis Neg Hx    Angioedema Neg Hx    Asthma Neg Hx    Atopy Neg Hx    Eczema Neg Hx    Immunodeficiency Neg Hx    Urticaria Neg Hx     SOCIAL HISTORY: Social History   Socioeconomic History   Marital status: Widowed    Spouse name: Not on file   Number of children: Y   Years of education: Not on file   Highest education level: Not on file  Occupational History   Occupation: retired Programme researcher, broadcasting/film/video.   Tobacco Use   Smoking status: Never   Smokeless tobacco: Never  Vaping Use   Vaping status: Never Used  Substance and Sexual Activity   Alcohol use: Not Currently    Alcohol/week: 0.0 standard drinks of alcohol    Comment: occ glass on wine   Drug use: No   Sexual activity: Never  Other Topics Concern   Not on file  Social History Narrative   Widow    limited exercise.   Social Determinants of Health   Financial Resource Strain: Low Risk  (09/12/2022)   Overall Financial Resource Strain (CARDIA)    Difficulty of Paying Living Expenses: Not hard at all  Food Insecurity: No Food Insecurity (09/12/2022)   Hunger Vital Sign    Worried About Running Out of Food in the Last Year: Never true    Ran Out of Food in the Last Year: Never true  Transportation Needs: No Transportation Needs (09/12/2022)   PRAPARE  - Administrator, Civil Service (Medical): No    Lack of Transportation (Non-Medical): No  Physical Activity: Not on file  Stress: Not on file  Social Connections: Unknown (11/27/2021)   Received [facility]   Social Network    Social Network: Not on file  Intimate Partner Violence: Unknown (10/19/2021)   Received from [facility]   HITS    Physically Hurt: Not on file    Insult or Talk Down To: Not on file    Threaten Physical Harm: Not on file    Scream or Curse: Not on file      PHYSICAL EXAM  There were no vitals filed for this visit.  There is no height or weight on file to calculate BMI.  Generalized: Well developed, in no acute distress  Chest: Lungs clear to auscultation bilaterally  Neurological examination  Mentation: Alert oriented to time, place, history taking. Follows all commands speech and language fluent Cranial nerve II-XII: Extraocular movements were full, visual field were full on confrontational test Head turning and shoulder shrug  were normal and symmetric. Motor: The motor testing reveals 5 over 5 strength of all 4 extremities. Good symmetric motor tone is noted throughout.  Sensory: Sensory testing is intact to soft touch on all 4 extremities. No evidence of extinction is noted.  Gait and station: Gait is normal.    DIAGNOSTIC DATA (LABS, IMAGING, TESTING) - I reviewed patient records, labs, notes, testing and imaging myself where available.  Lab Results  Component Value Date   WBC 6.5 02/28/2022   HGB 12.2 02/28/2022   HCT 36.7 02/28/2022   MCV 74.6 (L) 02/28/2022   PLT 253 02/28/2022      Component Value Date/Time   NA 140 04/24/2022 1003   NA 140 11/21/2020 1026   K 4.2 04/24/2022 1003   CL 106 04/24/2022 1003   CO2 26 04/24/2022 1003   GLUCOSE 91 04/24/2022 1003   BUN 13 04/24/2022 1003   BUN 16 11/21/2020 1026   CREATININE 1.04 04/24/2022 1003   CREATININE 1.03 (H) 02/28/2022 0824    CREATININE 1.01 (H) 12/30/2020 1635   CALCIUM 9.3 04/24/2022 1003   PROT 7.1 04/24/2022 1003   ALBUMIN 4.0 04/24/2022 1003   AST 17 04/24/2022 1003   AST 16 02/28/2022 0824   ALT 9 04/24/2022 1003   ALT 10 02/28/2022 0824   ALKPHOS 88 04/24/2022 1003   BILITOT 0.8 04/24/2022 1003   BILITOT 0.8 02/28/2022 0824   GFRNONAA 57 (L) 02/28/2022 0824   GFRNONAA 53 (L) 01/04/2020 1059   GFRAA 62 01/04/2020 1059   Lab Results  Component Value Date   CHOL 106 04/24/2022   HDL 56.40 04/24/2022   LDLCALC 40 04/24/2022   TRIG 49.0 04/24/2022   CHOLHDL 2 04/24/2022   Lab Results  Component Value Date   HGBA1C 5.6 03/22/2022   Lab Results  Component Value Date   VITAMINB12 776 01/12/2021   Lab Results  Component Value Date   TSH 3.83 01/29/2023      ASSESSMENT AND PLAN 77 y.o. year old female  has a past medical history of Allergic rhinitis, Allergy, Arthritis, Asthma, Breast cancer (HCC), Chronic headache, Diabetes mellitus, Ductal carcinoma in situ (DCIS) of left breast (02/23/2022), DVT (deep venous thrombosis) (HCC), Dyspnea, Heart murmur, History of radiation therapy, Hypertension, Penicillin allergy (01/19/2020), Pulmonary embolism (HCC), Pulmonary hypertension (HCC), and Seizures (HCC). here with:  OSA on CPAP  - CPAP compliance suboptimal - Good treatment of AHI  - Encourage patient to use CPAP nightly and > 4 hours each night - F/U in 1 year or sooner if needed    [name], MSN, NP-C 03/11/2023, 3:35 PM [facility] [address], [address] [place], [place] [address] [phone]

## 2023-03-12 ENCOUNTER — Ambulatory Visit: Payer: Medicare Other | Admitting: Adult Health

## 2023-03-12 ENCOUNTER — Encounter: Payer: Self-pay | Admitting: Adult Health

## 2023-03-12 ENCOUNTER — Ambulatory Visit: Payer: Medicare Other | Admitting: *Deleted

## 2023-03-12 VITALS — BP 134/68 | HR 63 | Ht 64.0 in | Wt 203.6 lb

## 2023-03-12 DIAGNOSIS — J4550 Severe persistent asthma, uncomplicated: Secondary | ICD-10-CM

## 2023-03-12 DIAGNOSIS — G4733 Obstructive sleep apnea (adult) (pediatric): Secondary | ICD-10-CM | POA: Diagnosis not present

## 2023-03-12 NOTE — Progress Notes (Signed)
[place], [place] ([id number]) [id number] Page 1 of 7 Visit Report for 03/04/2023 Chief Complaint Document Details Patient Name: Date of Service: [name], [name]. 03/04/2023 8:00 A Price Medical Record Number: [id number] Patient Account Number: [id number] Date of Birth/Sex: Treating RN: [birth year] (77 y.o. F) Primary Care Provider: Kerby Nora Other Clinician: Referring Provider: Treating Provider/Extender: Madie Reno, Amy Weeks in Treatment: 0 Information Obtained from: Patient Chief Complaint 03/04/2023; history of open wounds to her toes on the right foot Electronic Signature(s) Signed: 03/04/2023 1:34:39 PM By: Geralyn Corwin DO Entered By: Geralyn Corwin on 03/04/2023 09:12:43 -------------------------------------------------------------------------------- HPI Details Patient Name: Date of Service: [name]. 03/04/2023 8:00 A Price Medical Record Number: [id number] Patient Account Number: [id number] Date of Birth/Sex: Treating RN: 1945-12-25 (77 y.o. F) Primary Care Provider: Kerby Nora Other Clinician: Referring Provider: Treating Provider/Extender: Madie Reno, Amy Weeks in Treatment: 0 History of Present Illness HPI Description: 03/04/2023 [name] is a 77 year old female with a past medical history of diet controlled type 2 diabetes, paroxysmal A-fib on Xarelto, OSA and Graves' disease that presents to the clinic for evaluation of previous wounds to her right great toe and in between the webbing of her second and third toe. On 01/22/2023 she visited the ED due to bleeding to her right great toe. She used a razor blade to remove dead skin creating a deep cut. Bleeding was controlled however patient subsequently developed cellulitis to the right great toe and was followed by her primary care physician for this issue. She also mentions that a blister arose from the webbing of her second and third toe After she cut  her right great toe. She was started on doxycycline and eventually switched to clindamycin as well as she had a dose of ceftriaxone IM. Over the past month and a half her wounds have healed. She has diabetic shoes. She follows with podiatry every 3 months for nail care. She states she sees them next month. She currently denies drainage from the previous wound sites or signs of infection. Electronic Signature(s) Signed: 03/04/2023 1:34:39 PM By: Geralyn Corwin DO Entered By: Geralyn Corwin on 03/04/2023 09:54:06 -------------------------------------------------------------------------------- Physical Exam Details Patient Name: Date of Service: [name]. 03/04/2023 8:00 A Price Medical Record Number: [id number] Patient Account Number: [id number] Date of Birth/Sex: Treating RN: Nov 05, 1945 (77 y.o. [name], [name] ([id number]) [id number] Page 2 of 7 Primary Care Provider: Kerby Nora Other Clinician: Referring Provider: Treating Provider/Extender: Madie Reno, Amy Weeks in Treatment: 0 Constitutional respirations regular, non-labored and within target range for patient.. Cardiovascular 2+ dorsalis pedis/posterior tibialis pulses. Psychiatric pleasant and cooperative. Notes Right foot: T the lateral aspect of the right great toe there is a callus over previous wound site. No drainage noted. No fluctuance on palpation. No open o wound. In between the second and third toes there is also callus formation to the previous wound site. No drainage on palpation. No open wound. No signs of surrounding infection to any of the previous wound sites. Electronic Signature(s) Signed: 03/04/2023 1:34:39 PM By: Geralyn Corwin DO Entered By: Geralyn Corwin on 03/04/2023 09:56:40 -------------------------------------------------------------------------------- Physician Orders Details Patient Name: Date of Service: [name]. 03/04/2023 8:00 A  Price Medical Record Number: [id number] Patient Account Number: [id number] Date of Birth/Sex: Treating RN: [birth year] (77 y.o. [name] Primary Care Provider: Kerby Nora Other Clinician: Referring Provider: Treating Provider/Extender: Madie Reno, Amy Weeks in Treatment: 0 Verbal / Phone Orders: No Diagnosis Coding ICD-10 Coding  Code Description E11.621 Type 2 diabetes mellitus with foot ulcer L97.512 Non-pressure chronic ulcer of other part of right foot with fat layer exposed Discharge From [facility] Discharge from Wound Care Center - Congratulations on healing!! Non Wound Condition Right Lower Extremity Other Non Wound Condition Orders/Instructions: - Keep areas clean and continue to apply antibiotic ointment and protect areas. Electronic Signature(s) Signed: 03/04/2023 1:34:39 PM By: Geralyn Corwin DO Entered By: Geralyn Corwin on 03/04/2023 09:56:47 -------------------------------------------------------------------------------- Problem List Details Patient Name: Date of Service: [name]. 03/04/2023 8:00 A Price Medical Record Number: [id number] Patient Account Number: [id number] Date of Birth/Sex: Treating RN: 03/13/1946 (77 y.o. F) Primary Care Provider: Kerby Nora Other Clinician: Referring Provider: Treating Provider/Extender: [name] ([id number]) [id number] Page 3 of 7 Weeks in Treatment: 0 Active Problems ICD-10 Encounter Code Description Active Date MDM Diagnosis E11.621 Type 2 diabetes mellitus with foot ulcer 03/04/2023 No Yes L84 Corns and callosities 03/04/2023 No Yes I48.0 Paroxysmal atrial fibrillation 03/04/2023 No Yes Z79.01 Long term (current) use of anticoagulants 03/04/2023 No Yes Inactive Problems Resolved Problems Electronic Signature(s) Signed: 03/04/2023 1:34:39 PM By: Geralyn Corwin DO Entered By: Geralyn Corwin on 03/04/2023  09:12:11 -------------------------------------------------------------------------------- Progress Note Details Patient Name: Date of Service: [name]. 03/04/2023 8:00 A Price Medical Record Number: [id number] Patient Account Number: [id number] Date of Birth/Sex: Treating RN: 01/03/1946 (77 y.o. F) Primary Care Provider: Kerby Nora Other Clinician: Referring Provider: Treating Provider/Extender: Madie Reno, Amy Weeks in Treatment: 0 Subjective Chief Complaint Information obtained from Patient 03/04/2023; history of open wounds to her toes on the right foot History of Present Illness (HPI) 03/04/2023 [name] is a 77 year old female with a past medical history of diet controlled type 2 diabetes, paroxysmal A-fib on Xarelto, OSA and Graves' disease that presents to the clinic for evaluation of previous wounds to her right great toe and in between the webbing of her second and third toe. On 01/22/2023 she visited the ED due to bleeding to her right great toe. She used a razor blade to remove dead skin creating a deep cut. Bleeding was controlled however patient subsequently developed cellulitis to the right great toe and was followed by her primary care physician for this issue. She also mentions that a blister arose from the webbing of her second and third toe After she cut her right great toe. She was started on doxycycline and eventually switched to clindamycin as well as she had a dose of ceftriaxone IM. Over the past month and a half her wounds have healed. She has diabetic shoes. She follows with podiatry every 3 months for nail care. She states she sees them next month. She currently denies drainage from the previous wound sites or signs of infection. Patient History Allergies Dilantin, latex, oxycodone, penicillin, codeine, hydrocodone, nickel, pregabalin Family History Cancer - Maternal Grandparents,Paternal Grandparents,Mother,Father,Siblings, Diabetes -  Mother, Hypertension - Mother, Stroke - Mother. Social History Never smoker, Alcohol Use - Never, Drug Use - No History. [place], [place] ([id number]) [id number] Page 4 of 7 Medical History Respiratory Patient has history of Asthma Cardiovascular Patient has history of Arrhythmia - a fib, Congestive Heart Failure, Deep Vein Thrombosis, Hypertension Endocrine Patient has history of Type II Diabetes Musculoskeletal Patient has history of Osteoarthritis Neurologic Patient has history of Seizure Disorder Medical A Surgical History Notes nd Respiratory Pulmonary Hypertension Cardiovascular SSS Gastrointestinal GERD, constipation Endocrine Graves Disease, Hyperthyroidism Neurologic Bil hearing loss Psychiatric Major Depression Review of Systems (ROS)  Gastrointestinal Complains or has symptoms of Frequent diarrhea. Objective Constitutional respirations regular, non-labored and within target range for patient.. Vitals Time Taken: 8:09 AM, Height: 64 in, Weight: 198 lbs, BMI: 34, Temperature: 98.5 F, Pulse: 73 bpm, Respiratory Rate: 18 breaths/min, Blood Pressure: 142/76 mmHg, Capillary Blood Glucose: 99 mg/dl. Cardiovascular 2+ dorsalis pedis/posterior tibialis pulses. Psychiatric pleasant and cooperative. General Notes: Right foot: T the lateral aspect of the right great toe there is a callus over previous wound site. No drainage noted. No fluctuance on palpation. o No open wound. In between the second and third toes there is also callus formation to the previous wound site. No drainage on palpation. No open wound. No signs of surrounding infection to any of the previous wound sites. Integumentary (Hair, Skin) Wound #1 status is Healed - Epithelialized. Original cause of wound was Laceration. The date acquired was: 02/01/2023. The wound is located on the Right,Medial T Great. The wound measures 0cm length x 0cm width x 0cm depth; 0cm^2 area and 0cm^3  volume. There is Fat Layer (Subcutaneous Tissue) exposed. oe There is no tunneling or undermining noted. There is a medium amount of serosanguineous drainage noted. The wound margin is distinct with the outline attached to the wound base. There is medium (34-66%) red, pink granulation within the wound bed. There is a medium (34-66%) amount of necrotic tissue within the wound bed. The periwound skin appearance had no abnormalities noted for color. The periwound skin appearance exhibited: Callus. The periwound skin appearance did not exhibit: Dry/Scaly, Maceration. Wound #2 status is Healed - Epithelialized. Original cause of wound was Gradually Appeared. The date acquired was: 02/01/2023. The wound is located on the Right T - Web between 2nd and 3rd. The wound measures 0cm length x 0cm width x 0cm depth; 0cm^2 area and 0cm^3 volume. There is Fat Layer oe (Subcutaneous Tissue) exposed. There is no tunneling or undermining noted. There is a medium amount of serosanguineous drainage noted. The wound margin is distinct with the outline attached to the wound base. There is medium (34-66%) red, pink granulation within the wound bed. There is a medium (34-66%) amount of necrotic tissue within the wound bed. The periwound skin appearance had no abnormalities noted for texture. The periwound skin appearance had no abnormalities noted for color. The periwound skin appearance did not exhibit: Dry/Scaly, Maceration. Assessment Active Problems ICD-10 Type 2 diabetes mellitus with foot ulcer Corns and callosities Paroxysmal atrial fibrillation Long term (current) use of anticoagulants [place], Emalyn Price ([id number]) [id number] Page 5 of 7 Patient presents with a history of wounds to her right great toe in the webbing between her second and third great toe. She is a diabetic but well-controlled. The right great toe wound was caused by trauma and the second webbing space sounds to have  been caused by a blister. She had several courses of antibiotics that resolved her symptoms. She presents with no open wounds. I recommended inspecting her feet daily. She can keep the area protected daily for the next week and Wear her diabetic shoes. She follows with podiatry fairly closely and will see them next month. I recommended she call us with any questions or concerns that arise before she sees podiatry. Plan Discharge From [facility] Services: Discharge from Wound Care Center - Congratulations on healing!! Non Wound Condition: Other Non Wound Condition Orders/Instructions: - Keep areas clean and continue to apply antibiotic ointment and protect areas. 1. Follow-up as needed 2. Discharge from clinic due to no open wounds Electronic Signature(s)  Signed: 03/04/2023 1:34:39 PM By: Geralyn Corwin DO Entered By: Geralyn Corwin on 03/04/2023 09:58:31 -------------------------------------------------------------------------------- HxROS Details Patient Name: Date of Service: Pearline Cables Price. 03/04/2023 8:00 A Price Medical Record Number: [id number] Patient Account Number: [id number] Date of Birth/Sex: Treating RN: 10/19/1945 (77 y.o. [name] Primary Care Provider: Kerby Nora Other Clinician: Referring Provider: Treating Provider/Extender: Madie Reno, Amy Weeks in Treatment: 0 Gastrointestinal Complaints and Symptoms: Positive for: Frequent diarrhea Medical History: Past Medical History Notes: GERD, constipation Respiratory Medical History: Positive for: Asthma Past Medical History Notes: Pulmonary Hypertension Cardiovascular Medical History: Positive for: Arrhythmia - a fib; Congestive Heart Failure; Deep Vein Thrombosis; Hypertension Past Medical History Notes: SSS Endocrine Medical History: Positive for: Type II Diabetes Past Medical History Notes: Graves Disease, Hyperthyroidism Musculoskeletal Medical History: Positive for:  Osteoarthritis Neurologic FRIEBEL, Aryona Price ([id number]) [id number] Page 6 of 7 Medical History: Positive for: Seizure Disorder Past Medical History Notes: Bil hearing loss Psychiatric Medical History: Past Medical History Notes: Major Depression Immunizations Pneumococcal Vaccine: Received Pneumococcal Vaccination: No Implantable Devices Yes Family and Social History Cancer: Yes - Maternal Grandparents,Paternal Grandparents,Mother,Father,Siblings; Diabetes: Yes - Mother; Hypertension: Yes - Mother; Stroke: Yes - Mother; Never smoker; Alcohol Use: Never; Drug Use: No History Electronic Signature(s) Signed: 03/04/2023 1:34:39 PM By: Geralyn Corwin DO Signed: 03/12/2023 2:03:55 PM By: Brenton Grills Entered By: Brenton Grills on 03/04/2023 08:14:43 -------------------------------------------------------------------------------- SuperBill Details Patient Name: Date of Service: [name]. 03/04/2023 Medical Record Number: [id number] Patient Account Number: [id number] Date of Birth/Sex: Treating RN: 29-Jun-1946 (77 y.o. [name] Primary Care Provider: Kerby Nora Other Clinician: Referring Provider: Treating Provider/Extender: Madie Reno, Amy Weeks in Treatment: 0 Diagnosis Coding ICD-10 Codes Code Description E11.621 Type 2 diabetes mellitus with foot ulcer L84 Corns and callosities I48.0 Paroxysmal atrial fibrillation Z79.01 Long term (current) use of anticoagulants Facility Procedures : CPT4 Code: [id number] Description: 78938 - WOUND CARE VISIT-LEV 4 NEW PT Modifier: Quantity: 1 Physician Procedures : CPT4 Code Description Modifier [id number] 99213 - WC PHYS LEVEL 3 - EST PT ICD-10 Diagnosis Description E11.621 Type 2 diabetes mellitus with foot ulcer L84 Corns and callosities I48.0 Paroxysmal atrial fibrillation Z79.01 Long term (current) use of  anticoagulants Quantity: 1 Electronic Signature(s) Signed: 03/04/2023  1:34:39 PM By: Salvadore Dom, Johnnie Price ([id number]) [id number] Page 7 of 7 Entered By: Geralyn Corwin on 03/04/2023 09:58:47

## 2023-03-12 NOTE — Progress Notes (Signed)
[employer], [place] ([id number]) [id number] Page 1 of 4 Visit Report for 03/04/2023 Abuse Risk Screen Details Patient Name: Date of Service: [name], [name]. 03/04/2023 8:00 A M Medical Record Number: [id number] Patient Account Number: [id number] Date of Birth/Sex: Treating RN: 1945/08/29 (77 y.o. [name] Primary Care [place]: Kerby Nora Other Clinician: Referring Bonna Steury: Treating Jeff Mccallum/Extender: Madie Reno, Amy Weeks in Treatment: 0 Abuse Risk Screen Items Answer ABUSE RISK SCREEN: Has anyone close to you tried to hurt or harm you recentlyo No Do you feel uncomfortable with anyone in your familyo No Has anyone forced you do things that you didnt want to doo No Electronic Signature(s) Signed: 03/12/2023 2:03:55 PM By: Brenton Grills Entered By: Brenton Grills on 03/04/2023 08:16:10 -------------------------------------------------------------------------------- Activities of Daily Living Details Patient Name: Date of Service: [name], [name]. 03/04/2023 8:00 A M Medical Record Number: [id number] Patient Account Number: [id number] Date of Birth/Sex: Treating RN: June 03, 1946 (77 y.o. [name] Primary Care Aradhya Shellenbarger: Kerby Nora Other Clinician: Referring Gearldean Lomanto: Treating Isador Castille/Extender: Madie Reno, Amy Weeks in Treatment: 0 Activities of Daily Living Items Answer Activities of Daily Living (Please select one for each item) Drive Automobile Completely Able T Medications ake Completely Able Use T elephone Completely Able Care for Appearance Completely Able Use T oilet Completely Able Bath / Shower Completely Able Dress Self Completely Able Feed Self Completely Able Walk Completely Able Get In / Out Bed Completely Able Housework Completely Able Prepare Meals Completely Able Handle Money Completely Able Shop for Self Completely Able Electronic Signature(s) Signed: 03/12/2023 2:03:55 PM  By: Brenton Grills Entered By: Brenton Grills on 03/04/2023 08:16:46 [place], Lyn M ([id number]) [phone].pdf Page 2 of 4 -------------------------------------------------------------------------------- Education Screening Details Patient Name: Date of Service: [name], [name]. 03/04/2023 8:00 A M Medical Record Number: [id number] Patient Account Number: [id number] Date of Birth/Sex: Treating RN: [birth year] (77 y.o. [name] Primary Care Blondina Coderre: Kerby Nora Other Clinician: Referring Alliana Mcauliff: Treating Hasset Chaviano/Extender: Madie Reno, Amy Weeks in Treatment: 0 Learning Preferences/Education Level/Primary Language Highest Education Level: High School Preferred Language: English Cognitive Barrier Language Barrier: No Translator Needed: No Memory Deficit: No Emotional Barrier: No Cultural/Religious Beliefs Affecting Medical Care: No Physical Barrier Impaired Vision: Yes Glasses Impaired Hearing: No Decreased Hand dexterity: No Knowledge/Comprehension Knowledge Level: Medium Comprehension Level: Medium Ability to understand written instructions: Medium Ability to understand verbal instructions: Medium Motivation Anxiety Level: Calm Cooperation: Cooperative Education Importance: Acknowledges Need Interest in Health Problems: Asks Questions Perception: Coherent Willingness to Engage in Self-Management High Activities: Readiness to Engage in Self-Management High Activities: Electronic Signature(s) Signed: 03/12/2023 2:03:55 PM By: Brenton Grills Entered By: Brenton Grills on 03/04/2023 08:17:24 -------------------------------------------------------------------------------- Fall Risk Assessment Details Patient Name: Date of Service: [name]. 03/04/2023 8:00 A M Medical Record Number: [id number] Patient Account Number: [id number] Date of Birth/Sex: Treating RN: 21-Dec-1945 (77 y.o. [name] Primary  Care Filiberto Wamble: Kerby Nora Other Clinician: Referring Shani Fitch: Treating Denym Rahimi/Extender: Madie Reno, Amy Weeks in Treatment: 0 Fall Risk Assessment Items Have you had 2 or more falls in the last 12 monthso 0 No Have you had any fall that resulted in injury in the last 12 monthso 0 No [address] [place], [place] M ([id number]) [phone] [id number] Page 3 of 4 History of falling - immediate or within 3 months 0 No Secondary diagnosis (Do you have 2 or more medical diagnoseso) 0 No Ambulatory aid None/bed rest/wheelchair/nurse 0 No Crutches/cane/walker 0 No Furniture 0 No Intravenous therapy  Access/Saline/Heparin Lock 0 No Gait/Transferring Normal/ bed rest/ wheelchair 0 No Weak (short steps with or without shuffle, stooped but able to lift head while walking, may seek 0 No support from furniture) Impaired (short steps with shuffle, may have difficulty arising from chair, head down, impaired 0 No balance) Mental Status Oriented to own ability 0 No Electronic Signature(s) Signed: 03/12/2023 2:03:55 PM By: Brenton Grills Entered By: Brenton Grills on 03/04/2023 08:17:36 -------------------------------------------------------------------------------- Foot Assessment Details Patient Name: Date of Service: [name]. 03/04/2023 8:00 A M Medical Record Number: [id number] Patient Account Number: [id number] Date of Birth/Sex: Treating RN: 16-Nov-1945 (77 y.o. [name] Primary Care Farah Benish: Kerby Nora Other Clinician: Referring Eliya Bubar: Treating Islay Polanco/Extender: Madie Reno, Amy Weeks in Treatment: 0 Foot Assessment Items Site Locations + = Sensation present, - = Sensation absent, C = Callus, U = Ulcer R = Redness, W = Warmth, M = Maceration, PU = Pre-ulcerative lesion F = Fissure, S = Swelling, D = Dryness Assessment Right: Left: Other Deformity: No No Prior Foot Ulcer: No No Prior Amputation: No  No Charcot Joint: No No Ambulatory Status: Ambulatory Without Help GaitTashia Shelden, [place] M ([id number]) [phone] [id number] Page 4 of 4 Electronic Signature(s) Signed: 03/12/2023 2:03:55 PM By: Brenton Grills Entered By: Brenton Grills on 03/04/2023 08:25:05 -------------------------------------------------------------------------------- Nutrition Risk Screening Details Patient Name: Date of Service: [name], [name]. 03/04/2023 8:00 A M Medical Record Number: [id number] Patient Account Number: [id number] Date of Birth/Sex: Treating RN: Mar 27, 1946 (77 y.o. [name] Primary Care Taha Dimond: Kerby Nora Other Clinician: Referring Ra Pfiester: Treating Alyia Lacerte/Extender: Madie Reno, Amy Weeks in Treatment: 0 Height (in): 64 Weight (lbs): 198 Body Mass Index (BMI): 34 Nutrition Risk Screening Items Score Screening NUTRITION RISK SCREEN: I have an illness or condition that made me change the kind and/or amount of food I eat 0 No I eat fewer than two meals per day 0 No I eat few fruits and vegetables, or milk products 0 No I have three or more drinks of beer, liquor or wine almost every day 0 No I have tooth or mouth problems that make it hard for me to eat 0 No I don't always have enough money to buy the food I need 0 No I eat alone most of the time 0 No I take three or more different prescribed or over-the-counter drugs a day 0 No Without wanting to, I have lost or gained 10 pounds in the last six months 0 No I am not always physically able to shop, cook and/or feed myself 0 No Nutrition Protocols Good Risk Protocol Moderate Risk Protocol High Risk Proctocol Risk Level: Good Risk Score: 0 Electronic Signature(s) Signed: 03/12/2023 2:03:55 PM By: Brenton Grills Entered By: Brenton Grills on 03/04/2023 08:18:06

## 2023-03-12 NOTE — Progress Notes (Signed)
[place], [place] ([id number]) [id number] Page 1 of 9 Visit Report for 03/04/2023 Allergy List Details Patient Name: Date of Service: [name], [name]. 03/04/2023 8:00 A Price Medical Record Number: [id number] Patient Account Number: [id number] Date of Birth/Sex: Treating RN: [birth year] (77 y.o. [name] Primary Care Adonai Helzer: Kerby Nora Other Clinician: Referring Alysia Scism: Treating Harjot Dibello/Extender: Madie Reno, Amy Weeks in Treatment: 0 Allergies Active Allergies Dilantin latex oxycodone penicillin codeine hydrocodone nickel pregabalin Allergy Notes Electronic Signature(s) Signed: 03/12/2023 2:03:55 PM By: Brenton Grills Entered By: Brenton Grills on 03/04/2023 08:14:21 -------------------------------------------------------------------------------- Arrival Information Details Patient Name: Date of Service: [name]. 03/04/2023 8:00 A Price Medical Record Number: [id number] Patient Account Number: [id number] Date of Birth/Sex: Treating RN: 16-Feb-1946 (77 y.o. [name] Primary Care Tian Davison: Kerby Nora Other Clinician: Referring Braxley Balandran: Treating Rayshad Riviello/Extender: Madie Reno, Amy Weeks in Treatment: 0 Visit Information Patient Arrived: Ambulatory Arrival Time: 08:08 Accompanied By: self Transfer Assistance: None Patient Identification Verified: Yes Secondary Verification Process Completed: Yes Patient Requires Transmission-Based Precautions: No Patient Has Alerts: No Electronic Signature(s) Signed: 03/12/2023 2:03:55 PM By: Kendal Hymen, Marwah Price ([id number]) PM By: Dorthula Perfect.pdf Page 2 of 9 Signed: 03/12/2023 2:03:55 Entered By: Brenton Grills on 03/04/2023 08:09:32 -------------------------------------------------------------------------------- Clinic Level of Care Assessment Details Patient Name: Date of Service: [name], [name]. 03/04/2023  8:00 A Price Medical Record Number: [id number] Patient Account Number: [id number] Date of Birth/Sex: Treating RN: July 17, 1945 (77 y.o. [name] Primary Care Caroleann Casler: Kerby Nora Other Clinician: Referring Tylah Mancillas: Treating Anthonella Klausner/Extender: Madie Reno, Amy Weeks in Treatment: 0 Clinic Level of Care Assessment Items TOOL 2 Quantity Score X- 1 0 Use when only an EandM is performed on the INITIAL visit ASSESSMENTS - Nursing Assessment / Reassessment X- 1 20 General Physical Exam (combine w/ comprehensive assessment (listed just below) when performed on new pt. evals) X- 1 25 Comprehensive Assessment (HX, ROS, Risk Assessments, Wounds Hx, etc.) ASSESSMENTS - Wound and Skin A ssessment / Reassessment X - Simple Wound Assessment / Reassessment - one wound 1 5 X- 1 5 Complex Wound Assessment / Reassessment - multiple wounds X- 1 10 Dermatologic / Skin Assessment (not related to wound area) ASSESSMENTS - Ostomy and/or Continence Assessment and Care []  - 0 Incontinence Assessment and Management []  - 0 Ostomy Care Assessment and Management (repouching, etc.) PROCESS - Coordination of Care X - Simple Patient / Family Education for ongoing care 1 15 []  - 0 Complex (extensive) Patient / Family Education for ongoing care X- 1 10 Staff obtains Chiropractor, Records, T Results / Process Orders est []  - 0 Staff telephones HHA, Nursing Homes / Clarify orders / etc []  - 0 Routine Transfer to another Facility (non-emergent condition) []  - 0 Routine Hospital Admission (non-emergent condition) []  - 0 New Admissions / Manufacturing engineer / Ordering NPWT Apligraf, etc. , []  - 0 Emergency Hospital Admission (emergent condition) X- 1 10 Simple Discharge Coordination []  - 0 Complex (extensive) Discharge Coordination PROCESS - Special Needs []  - 0 Pediatric / Minor Patient Management []  - 0 Isolation Patient Management []  - 0 Hearing / Language / Visual special  needs []  - 0 Assessment of Community assistance (transportation, D/C planning, etc.) []  - 0 Additional assistance / Altered mentation []  - 0 Support Surface(s) Assessment (bed, cushion, seat, etc.) INTERVENTIONS - Wound Cleansing / Measurement []  - 0 Wound Imaging (photographs - any number of wounds) []  - 0 Wound Tracing (instead of photographs) [name], [name] ([id number]) [id number]  Page 3 of 9 []  - 0 Simple Wound Measurement - one wound X- 2 5 Complex Wound Measurement - multiple wounds []  - 0 Simple Wound Cleansing - one wound []  - 0 Complex Wound Cleansing - multiple wounds INTERVENTIONS - Wound Dressings []  - 0 Small Wound Dressing one or multiple wounds []  - 0 Medium Wound Dressing one or multiple wounds []  - 0 Large Wound Dressing one or multiple wounds []  - 0 Application of Medications - injection INTERVENTIONS - Miscellaneous []  - 0 External ear exam []  - 0 Specimen Collection (cultures, biopsies, blood, body fluids, etc.) []  - 0 Specimen(s) / Culture(s) sent or taken to Lab for analysis []  - 0 Patient Transfer (multiple staff / Nurse, adult / Similar devices) []  - 0 Simple Staple / Suture removal (25 or less) []  - 0 Complex Staple / Suture removal (26 or more) []  - 0 Hypo / Hyperglycemic Management (close monitor of Blood Glucose) X- 1 15 Ankle / Brachial Index (ABI) - do not check if billed separately Has the patient been seen at the hospital within the last three years: Yes Total Score: 125 Level Of Care: New/Established - Level 4 Electronic Signature(s) Signed: 03/12/2023 2:03:55 PM By: Brenton Grills Entered By: Brenton Grills on 03/04/2023 08:59:23 -------------------------------------------------------------------------------- Encounter Discharge Information Details Patient Name: Date of Service: [name]. 03/04/2023 8:00 A Price Medical Record Number: [id number] Patient Account Number: [id number] Date of Birth/Sex:  Treating RN: Dec 12, 1945 (77 y.o. [name] Primary Care Eboni Coval: Kerby Nora Other Clinician: Referring Rielly Corlett: Treating Aravind Chrismer/Extender: Madie Reno, Amy Weeks in Treatment: 0 Encounter Discharge Information Items Discharge Condition: Stable Ambulatory Status: Ambulatory Discharge Destination: Home Transportation: Private Auto Accompanied By: self Schedule Follow-up Appointment: Yes Clinical Summary of Care: Patient Declined Electronic Signature(s) Signed: 03/12/2023 2:03:55 PM By: Brenton Grills Entered By: Brenton Grills on 03/04/2023 09:04:36 [name], [name] ([id number]) [id number] Page 4 of 9 -------------------------------------------------------------------------------- Lower Extremity Assessment Details Patient Name: Date of Service: [name], [name]. 03/04/2023 8:00 A Price Medical Record Number: [id number] Patient Account Number: [id number] Date of Birth/Sex: Treating RN: 1946-01-18 (77 y.o. [name] Primary Care Olivier Frayre: Kerby Nora Other Clinician: Referring Tashawn Greff: Treating Kim Lauver/Extender: Madie Reno, Amy Weeks in Treatment: 0 Edema Assessment Assessed: [Left: No] [Right: No] [Left: Edema] [Right: :] Calf Left: Right: Point of Measurement: From Medial Instep 41 cm Ankle Left: Right: Point of Measurement: From Medial Instep 21.1 cm Vascular Assessment Pulses: Dorsalis Pedis Palpable: [Right:Yes] Extremity colors, hair growth, and conditions: Extremity Color: [Right:Normal] Hair Growth on Extremity: [Right:Yes] Temperature of Extremity: [Right:Warm] Capillary Refill: [Right:< 3 seconds] Dependent Rubor: [Right:No] Blanched when Elevated: [Right:No] Lipodermatosclerosis: [Right:No] Blood Pressure: Brachial: [Right:142] Ankle: [Right:Dorsalis Pedis: 158 1.11] Toe Nail Assessment Left: Right: Thick: No Discolored: No Deformed: No Improper Length and Hygiene: No Electronic  Signature(s) Signed: 03/12/2023 2:03:55 PM By: Brenton Grills Entered By: Brenton Grills on 03/04/2023 08:33:02 -------------------------------------------------------------------------------- Multi Wound Chart Details Patient Name: Date of Service: [name]. 03/04/2023 8:00 A Price Medical Record Number: [id number] Patient Account Number: [id number] Date of Birth/Sex: Treating RN: 21-Dec-1945 (77 y.o. F) Primary Care Laranda Burkemper: Kerby Nora Other Clinician: Referring Monifa Blanchette: Treating Breyonna Nault/Extender: Madie Reno, Amy Weeks in Treatment: 0 [place], Ivanna Price ([id number]) [id number] Page 5 of 9 Vital Signs Height(in): 64 Capillary Blood Glucose(mg/dl): 99 Weight(lbs): 433 Pulse(bpm): 73 Body Mass Index(BMI): 34 Blood Pressure(mmHg): 142/76 Temperature(F): 98.5 Respiratory Rate(breaths/min): 18 [1:Photos: No Photos Right, Medial T Great oe Wound Location: Laceration Wounding Event: Diabetic Wound/Ulcer  of the Lower Primary Etiology: Extremity Asthma, Arrhythmia, Congestive Heart Asthma, Arrhythmia, Congestive Heart N/A Comorbid History: Failure, Deep Vein  Thrombosis, Hypertension, Type II Diabetes, Osteoarthritis, Seizure Disorder 02/01/2023 Date Acquired: 0 Weeks of Treatment: Healed - Epithelialized Wound Status: No Wound Recurrence: 0x0x0 Measurements L x W x D (cm) 0 A (cm) : rea 0 Volume (cm) : Grade  1 Classification: Medium Exudate A mount: Serosanguineous Exudate Type: red, brown Exudate Color: Distinct, outline attached Wound Margin: Medium (34-66%) Granulation A mount: Red, Pink Granulation Quality: Medium (34-66%) Necrotic A mount: Fat Layer  (Subcutaneous Tissue): Yes Fat Layer (Subcutaneous Tissue): Yes N/A Exposed Structures: None Epithelialization: Callus: Yes Periwound Skin Texture: Maceration: No Periwound Skin Moisture: Dry/Scaly: No No Abnormalities Noted Periwound Skin Color:] [2:No  Photos Right T - Web between 2nd and 3rd N/A  oe Gradually Appeared Diabetic Wound/Ulcer of the Lower Extremity Failure, Deep Vein Thrombosis, Hypertension, Type II Diabetes, Osteoarthritis, Seizure Disorder 02/01/2023 0 Healed - Epithelialized No 0x0x0 0  0 Grade 1 Medium Serosanguineous red, brown Distinct, outline attached Medium (34-66%) Red, Pink Medium (34-66%) N/A No Abnormalities Noted Maceration: No Dry/Scaly: No No Abnormalities Noted] [N/A:N/A N/A N/A N/A N/A N/A N/A N/A N/A N/A N/A N/A N/A N/A  N/A N/A N/A N/A N/A N/A N/A N/A] Treatment Notes Wound #1 (Toe Great) Wound Laterality: Right, Medial Cleanser Peri-Wound Care Topical Primary Dressing Secondary Dressing Secured With Compression Wrap Compression Stockings Add-Ons Wound #2 (Toe - Web between 2nd and 3rd) Wound Laterality: Right Cleanser Peri-Wound Care Topical Primary Dressing Secondary Dressing Secured With Compression Wrap Compression Stockings Add-Ons [name], [name] [place] ([id number]) [id number] Page 6 of 9 Electronic Signature(s) Signed: 03/04/2023 1:34:39 PM By: Geralyn Corwin DO Entered By: Geralyn Corwin on 03/04/2023 09:12:16 -------------------------------------------------------------------------------- Multi-Disciplinary Care Plan Details Patient Name: Date of Service: [name]. 03/04/2023 8:00 A Price Medical Record Number: [id number] Patient Account Number: [id number] Date of Birth/Sex: Treating RN: 05/11/1946 (77 y.o. [name] Primary Care Hedy Garro: Kerby Nora Other Clinician: Referring Giulliana Mcroberts: Treating Gabrille Kilbride/Extender: Madie Reno, Amy Weeks in Treatment: 0 Active Inactive Electronic Signature(s) Signed: 03/12/2023 2:03:55 PM By: Brenton Grills Entered By: Brenton Grills on 03/04/2023 09:00:14 -------------------------------------------------------------------------------- Pain Assessment Details Patient Name: Date of Service: [name]. 03/04/2023 8:00 A Price Medical Record  Number: [id number] Patient Account Number: [id number] Date of Birth/Sex: Treating RN: 11-27-1945 (77 y.o. [name] Primary Care Kahli Fitzgerald: Kerby Nora Other Clinician: Referring Shayaan Parke: Treating Latamara Melder/Extender: Madie Reno, Amy Weeks in Treatment: 0 Active Problems Location of Pain Severity and Description of Pain Patient Has Paino No Site Locations Pain Management and Medication Current Pain Management: ZYRIA, [place] ([id number]) [id number] Page 7 of 9 Electronic Signature(s) Signed: 03/12/2023 2:03:55 PM By: Brenton Grills Entered By: Brenton Grills on 03/04/2023 08:43:08 -------------------------------------------------------------------------------- Patient/Caregiver Education Details Patient Name: Date of Service: [name], Alicia NICA Price. 8/19/2024andnbsp8:00 A Price Medical Record Number: [id number] Patient Account Number: [id number] Date of Birth/Gender: Treating RN: 10-30-1945 (77 y.o. [name] Primary Care Physician: Kerby Nora Other Clinician: Referring Physician: Treating Physician/Extender: Madie Reno, Amy Weeks in Treatment: 0 Education Assessment Education Provided To: Patient Education Topics Provided Wound/Skin Impairment: Methods: Explain/Verbal Responses: State content correctly Electronic Signature(s) Signed: 03/12/2023 2:03:55 PM By: Brenton Grills Entered By: Brenton Grills on 03/04/2023 08:46:36 -------------------------------------------------------------------------------- Wound Assessment Details Patient Name: Date of Service: [name]. 03/04/2023 8:00 A Price Medical Record Number: [id number] Patient Account Number: [id number] Date of Birth/Sex: Treating RN: 03-29-1946 (77 y.o. [name]  Primary Care Mikesha Migliaccio: Kerby Nora Other Clinician: Referring Adreona Brand: Treating Lindell Renfrew/Extender: Madie Reno, Amy Weeks in Treatment: 0 Wound Status Wound Number:  1 Primary Diabetic Wound/Ulcer of the Lower Extremity Etiology: Wound Location: Right, Medial T Great oe Wound Healed - Epithelialized Wounding Event: Laceration Status: Date Acquired: 02/01/2023 Comorbid Asthma, Arrhythmia, Congestive Heart Failure, Deep Vein Weeks Of Treatment: 0 History: Thrombosis, Hypertension, Type II Diabetes, Osteoarthritis, Seizure Clustered Wound: No Disorder Wound Measurements Length: (cm) Width: (cm) Depth: (cm) Area: (cm) Volume: (cm) 0 % Reduction in Area: 0 % Reduction in Volume: 0 Epithelialization: None 0 Tunneling: No 0 Undermining: No Wound Description Classification: Grade 1 Wound Margin: Distinct, outline attached Exudate Amount: Medium [name], [name] ([id number]) Exudate Type: Serosanguineous Exudate Color: red, brown Foul Odor After Cleansing: No Slough/Fibrino Yes [id number] Page 8 of 9 Wound Bed Granulation Amount: Medium (34-66%) Exposed Structure Granulation Quality: Red, Pink Fat Layer (Subcutaneous Tissue) Exposed: Yes Necrotic Amount: Medium (34-66%) Periwound Skin Texture Texture Color No Abnormalities Noted: No No Abnormalities Noted: Yes Callus: Yes Moisture No Abnormalities Noted: No Dry / Scaly: No Maceration: No Treatment Notes Wound #1 (Toe Great) Wound Laterality: Right, Medial Cleanser Peri-Wound Care Topical Primary Dressing Secondary Dressing Secured With Compression Wrap Compression Stockings Add-Ons Electronic Signature(s) Signed: 03/12/2023 2:03:55 PM By: Brenton Grills Entered By: Brenton Grills on 03/04/2023 08:57:24 -------------------------------------------------------------------------------- Wound Assessment Details Patient Name: Date of Service: [name]. 03/04/2023 8:00 A Price Medical Record Number: [id number] Patient Account Number: [id number] Date of Birth/Sex: Treating RN: Jul 05, 1946 (77 y.o. [name] Primary Care Wilfred Dayrit: Kerby Nora  Other Clinician: Referring Filipe Greathouse: Treating Hope Holst/Extender: Madie Reno, Amy Weeks in Treatment: 0 Wound Status Wound Number: 2 Primary Diabetic Wound/Ulcer of the Lower Extremity Etiology: Wound Location: Right T - Web between 2nd and 3rd oe Wound Healed - Epithelialized Wounding Event: Gradually Appeared Status: Date Acquired: 02/01/2023 Comorbid Asthma, Arrhythmia, Congestive Heart Failure, Deep Vein Weeks Of Treatment: 0 History: Thrombosis, Hypertension, Type II Diabetes, Osteoarthritis, Seizure Clustered Wound: No Disorder Wound Measurements Length: (cm) Width: (cm) Depth: (cm) Area: (cm) Volume: (cm) 0 % Reduction in Area: 0 % Reduction in Volume: 0 Tunneling: No 0 Undermining: No 0 Wound Description [name], [name] ([id number]) Classification: Grade 1 Wound Margin: Distinct, outline attached Exudate Amount: Medium Exudate Type: Serosanguineous Exudate Color: red, brown [id number] Page 9 of 9 Foul Odor After Cleansing: No Slough/Fibrino Yes Wound Bed Granulation Amount: Medium (34-66%) Exposed Structure Granulation Quality: Red, Pink Fat Layer (Subcutaneous Tissue) Exposed: Yes Necrotic Amount: Medium (34-66%) Periwound Skin Texture Texture Color No Abnormalities Noted: Yes No Abnormalities Noted: Yes Moisture No Abnormalities Noted: No Dry / Scaly: No Maceration: No Treatment Notes Wound #2 (Toe - Web between 2nd and 3rd) Wound Laterality: Right Cleanser Peri-Wound Care Topical Primary Dressing Secondary Dressing Secured With Compression Wrap Compression Stockings Add-Ons Electronic Signature(s) Signed: 03/12/2023 2:03:55 PM By: Brenton Grills Entered By: Brenton Grills on 03/04/2023 08:57:58 -------------------------------------------------------------------------------- Vitals Details Patient Name: Date of Service: [name]. 03/04/2023 8:00 A Price Medical Record Number: [id number] Patient  Account Number: [id number] Date of Birth/Sex: Treating RN: 10-Feb-1946 (77 y.o. [name] Primary Care Eulan Heyward: Kerby Nora Other Clinician: Referring Genowefa Morga: Treating Maynor Mwangi/Extender: Madie Reno, Amy Weeks in Treatment: 0 Vital Signs Time Taken: 08:09 Temperature (F): 98.5 Height (in): 64 Pulse (bpm): 73 Weight (lbs): 198 Respiratory Rate (breaths/min): 18 Body Mass Index (BMI): 34 Blood Pressure (mmHg): 142/76 Capillary Blood Glucose (mg/dl): 99 Reference Range: 80 - 120 mg /  dl Electronic Signature(s) Signed: 03/12/2023 2:03:55 PM By: Brenton Grills Entered By: Brenton Grills on 03/04/2023 08:13:39

## 2023-03-12 NOTE — Patient Instructions (Signed)
Continue using CPAP nightly and greater than 4 hours each night Mask refitting ordered If your symptoms worsen or you develop new symptoms please let us know.   

## 2023-03-19 ENCOUNTER — Other Ambulatory Visit (INDEPENDENT_AMBULATORY_CARE_PROVIDER_SITE_OTHER): Payer: Medicare Other

## 2023-03-19 DIAGNOSIS — E0500 Thyrotoxicosis with diffuse goiter without thyrotoxic crisis or storm: Secondary | ICD-10-CM

## 2023-03-20 ENCOUNTER — Other Ambulatory Visit: Payer: Self-pay | Admitting: Internal Medicine

## 2023-03-20 ENCOUNTER — Ambulatory Visit: Payer: Self-pay

## 2023-03-20 LAB — T3, FREE: T3, Free: 3.3 pg/mL (ref 2.3–4.2)

## 2023-03-20 LAB — TSH: TSH: 3.45 u[IU]/mL (ref 0.35–5.50)

## 2023-03-20 LAB — T4, FREE: Free T4: 1.02 ng/dL (ref 0.60–1.60)

## 2023-03-20 NOTE — Patient Outreach (Signed)
  Care Coordination   03/20/2023 Name: [name] MRN: [id number] DOB: February 10, 1946   Care Coordination Outreach Attempts:  An unsuccessful telephone outreach was attempted for a scheduled appointment today. HIPAA compliant message left with return call number.  Follow Up Plan:  Additional outreach attempts will be made to offer the patient care coordination information and services.   Encounter Outcome:  No Answer   Care Coordination Interventions:  No, not indicated    George Ina [facility] Care Coordination [phone] direct line

## 2023-03-22 ENCOUNTER — Telehealth: Payer: Self-pay | Admitting: Adult Health

## 2023-03-22 NOTE — Telephone Encounter (Signed)
Pt said no one has contacted about a mask for CPAP from [facility]. Would like a call back.

## 2023-03-25 ENCOUNTER — Encounter: Payer: Self-pay | Admitting: Podiatry

## 2023-03-25 ENCOUNTER — Ambulatory Visit (INDEPENDENT_AMBULATORY_CARE_PROVIDER_SITE_OTHER): Payer: Medicare Other | Admitting: Podiatry

## 2023-03-25 ENCOUNTER — Other Ambulatory Visit: Payer: Self-pay | Admitting: Cardiovascular Disease

## 2023-03-25 DIAGNOSIS — M79676 Pain in unspecified toe(s): Secondary | ICD-10-CM | POA: Diagnosis not present

## 2023-03-25 DIAGNOSIS — D689 Coagulation defect, unspecified: Secondary | ICD-10-CM

## 2023-03-25 DIAGNOSIS — D2371 Other benign neoplasm of skin of right lower limb, including hip: Secondary | ICD-10-CM | POA: Diagnosis not present

## 2023-03-25 DIAGNOSIS — B351 Tinea unguium: Secondary | ICD-10-CM

## 2023-03-25 DIAGNOSIS — I480 Paroxysmal atrial fibrillation: Secondary | ICD-10-CM

## 2023-03-25 DIAGNOSIS — E1142 Type 2 diabetes mellitus with diabetic polyneuropathy: Secondary | ICD-10-CM | POA: Diagnosis not present

## 2023-03-25 NOTE — Telephone Encounter (Signed)
New, Maryella Shivers, Otilio Jefferson, RN; Alain Honey; Kathe Becton; Randie Heinz, Libby; 1 other Hello Amila Callies,  Looks like this order was never sent to [place]. I have placed the order now.  However, patients notes show non-compliance of 27%.  I have sent this in but in for review but this may be kicked out.  Insurance requires usage and benefit notes to cover supplies and the pap rental.  I will let you know if I hear anything.  Thank you,  Luellen Pucker

## 2023-03-25 NOTE — Progress Notes (Signed)
She presents today for a follow-up of her hallux right for she cut it with a razor blade and could not stop the bleeding.  She went to the emergency department and her PCP as well as wound care for the wound and the bleeding.  At this point she states that seems to be doing pretty good and all of the wounds have healed.  Her toenails are long and painful and that she has hammertoes on the left foot that she is asking me to shorten so that her right foot and her left foot will match.  Objective: Vital signs are stable alert oriented x 3 pulses are palpable.  Hammertoe deformities left multiple benign skin lesions bilaterally toenails are long thick yellow dystrophic clinically mycotic.  Assessment: Pain in limb secondary to hammertoe deformities onychomycosis and nail dystrophy.  Plan: Debrided of toenails and benign skin lesions.  Explained to her that surgery to shorten her toes would not necessarily be a good idea.  I will follow-up with her on an as-needed basis otherwise she will see Dr. Eloy End now in 3 months

## 2023-03-25 NOTE — Telephone Encounter (Signed)
I sent a message to [facility] to follow-up asap.

## 2023-03-25 NOTE — Telephone Encounter (Signed)
Prescription refill request for Xarelto received.  Indication:afib Last office visit:8/24 Weight:92.4  kg Age:77 Scr:1.04  10/23 CrCl:67.13  ml/min  Prescription refilled

## 2023-03-26 ENCOUNTER — Telehealth: Payer: Self-pay

## 2023-03-26 NOTE — Patient Outreach (Signed)
Care Coordination   Follow Up Visit Note   03/26/2023 Name: [name] MRN: [id number] DOB: April 13, 1946  [name] is a 77 y.o. year old female who sees Excell Seltzer, MD for primary care. I spoke with  [name] by phone today.  What matters to the patients health and wellness today?  Patient states she is doing well. She reports right toe wound has healed without difficulty. Patient states her anxiety is more controlled.  Patient denies any new concerns at this time and verbalizes agreement that care coordination goals have been met.     Goals Addressed             This Visit's Progress    COMPLETED: management of feelings of anxiety and health conditions       Interventions Today    Flowsheet Row Most Recent Value  Chronic Disease   Chronic disease during today's visit Other  [cellulitis of right toe/ anxiety]  General Interventions   General Interventions Discussed/Reviewed General Interventions Reviewed, Doctor Visits  [evaluation of current treatment plan for left toe cellulitis / anxiety and patients adherence to plan as established by provider.  Assessed for ongoing cellulitis symptoms.]  Doctor Visits Discussed/Reviewed Doctor Visits Reviewed  [name] upcoming provider visits.  Advised to keep follow up appointments with providers.]  Education Interventions   Education Provided Provided Education  [Advised to notify provider for signs/ symptoms of infection. Advised to continue to follow up with podiatrist on regular basis as recommended. Advised to consider rescheduling counseling with new provider.]  Provided Verbal Education On Other  [Confirmed patient has contact phone number for RN case manager if care coordination services needed in the future.]  Mental Health Interventions   Mental Health Discussed/Reviewed Mental Health Reviewed  [name] for ongoing anxiety symptoms.]  Pharmacy Interventions   Pharmacy Dicussed/Reviewed Pharmacy Topics Reviewed   [medications reviewed. Encourage compliance with medications.]           COMPLETED: Patient stated: Management of health conditions       Interventions Today    Flowsheet Row Most Recent Value  Chronic Disease   Chronic disease during today's visit Other  [cellulitis of right toe/ anxiety]  General Interventions   General Interventions Discussed/Reviewed General Interventions Reviewed, Doctor Visits  [evaluation of current treatment plan for left toe cellulitis / anxiety and patients adherence to plan as established by provider.  Assessed for ongoing cellulitis symptoms.]  Doctor Visits Discussed/Reviewed Doctor Visits Reviewed  [name] upcoming provider visits.  Advised to keep follow up appointments with providers.]  Education Interventions   Education Provided Provided Education  [Advised to notify provider for signs/ symptoms of infection. Advised to continue to follow up with podiatrist on regular basis as recommended. Advised to consider rescheduling counseling with new provider.]  Provided Verbal Education On Other  [Confirmed patient has contact phone number for RN case manager if care coordination services needed in the future.]  Mental Health Interventions   Mental Health Discussed/Reviewed Mental Health Reviewed  [name] for ongoing anxiety symptoms.]  Pharmacy Interventions   Pharmacy Dicussed/Reviewed Pharmacy Topics Reviewed  [medications reviewed. Encourage compliance with medications.]               SDOH assessments and interventions completed:  No     Care Coordination Interventions:  Yes, provided   Follow up plan: No further intervention required.   Encounter Outcome:  Patient Visit Completed   [name] [facility] Care Coordination [phone] direct line

## 2023-03-26 NOTE — Patient Instructions (Signed)
Visit Information  Thank you for taking time to visit with me today. Please don't hesitate to contact me if I can be of assistance to you.   Following are the goals we discussed today:   Goals Addressed             This Visit's Progress    COMPLETED: management of feelings of anxiety and health conditions       Interventions Today    Flowsheet Row Most Recent Value  Chronic Disease   Chronic disease during today's visit Other  [cellulitis of right toe/ anxiety]  General Interventions   General Interventions Discussed/Reviewed General Interventions Reviewed, Doctor Visits  [evaluation of current treatment plan for left toe cellulitis / anxiety and patients adherence to plan as established by provider.  Assessed for ongoing cellulitis symptoms.]  Doctor Visits Discussed/Reviewed Doctor Visits Reviewed  Annabell Sabal upcoming provider visits.  Advised to keep follow up appointments with providers.]  Education Interventions   Education Provided Provided Education  [Advised to notify provider for signs/ symptoms of infection. Advised to continue to follow up with podiatrist on regular basis as recommended. Advised to consider rescheduling counseling with new provider.]  Provided Verbal Education On Other  [Confirmed patient has contact phone number for RN case manager if care coordination services needed in the future.]  Mental Health Interventions   Mental Health Discussed/Reviewed Mental Health Reviewed  Wanita Chamberlain for ongoing anxiety symptoms.]  Pharmacy Interventions   Pharmacy Dicussed/Reviewed Pharmacy Topics Reviewed  [medications reviewed. Encourage compliance with medications.]           COMPLETED: Patient stated: Management of health conditions       Interventions Today    Flowsheet Row Most Recent Value  Chronic Disease   Chronic disease during today's visit Other  [cellulitis of right toe/ anxiety]  General Interventions   General Interventions Discussed/Reviewed General  Interventions Reviewed, Doctor Visits  [evaluation of current treatment plan for left toe cellulitis / anxiety and patients adherence to plan as established by provider.  Assessed for ongoing cellulitis symptoms.]  Doctor Visits Discussed/Reviewed Doctor Visits Reviewed  Annabell Sabal upcoming provider visits.  Advised to keep follow up appointments with providers.]  Education Interventions   Education Provided Provided Education  [Advised to notify provider for signs/ symptoms of infection. Advised to continue to follow up with podiatrist on regular basis as recommended. Advised to consider rescheduling counseling with new provider.]  Provided Verbal Education On Other  [Confirmed patient has contact phone number for RN case manager if care coordination services needed in the future.]  Mental Health Interventions   Mental Health Discussed/Reviewed Mental Health Reviewed  Wanita Chamberlain for ongoing anxiety symptoms.]  Pharmacy Interventions   Pharmacy Dicussed/Reviewed Pharmacy Topics Reviewed  [medications reviewed. Encourage compliance with medications.]               Please contact your primary care provider if care coordination services needed in the future.   If you are experiencing a Mental Health or Behavioral Health Crisis or need someone to talk to, please call the Suicide and Crisis Lifeline: 988 call 1-800-273-TALK (toll free, 24 hour hotline)  Patient verbalizes understanding of instructions and care plan provided today and agrees to view in MyChart. Active MyChart status and patient understanding of how to access instructions and care plan via MyChart confirmed with patient.     George Ina RN,BSN,CCM [facility] Care Coordination [phone] direct line

## 2023-04-03 ENCOUNTER — Ambulatory Visit: Payer: Medicare Other

## 2023-04-03 ENCOUNTER — Telehealth: Payer: Self-pay

## 2023-04-03 NOTE — Telephone Encounter (Signed)
Noted  

## 2023-04-03 NOTE — Telephone Encounter (Signed)
Pt came in for NV flu shot; when asked if pt was allergic to latex; pt said yes that she has swelling and itching to upper body. We do not have latex free flu shots; Dr Ermalene Searing said to leave up to pt; pt will ck with pharmacy to see if they have latex free flu shot and  if pt cannot find latex free pt will cb and ck with Dr Ermalene Searing recommendation. Pt said "rather be safe than sorry".NV cancelled and pt did not get flu shot today..sending FYI to Dr Ermalene Searing.

## 2023-04-08 ENCOUNTER — Ambulatory Visit: Payer: Medicare Other | Admitting: Podiatry

## 2023-04-09 ENCOUNTER — Telehealth: Payer: Self-pay | Admitting: *Deleted

## 2023-04-09 ENCOUNTER — Ambulatory Visit (INDEPENDENT_AMBULATORY_CARE_PROVIDER_SITE_OTHER): Payer: Medicare Other

## 2023-04-09 DIAGNOSIS — J4550 Severe persistent asthma, uncomplicated: Secondary | ICD-10-CM

## 2023-04-09 DIAGNOSIS — E1169 Type 2 diabetes mellitus with other specified complication: Secondary | ICD-10-CM

## 2023-04-09 DIAGNOSIS — E1140 Type 2 diabetes mellitus with diabetic neuropathy, unspecified: Secondary | ICD-10-CM

## 2023-04-09 NOTE — Telephone Encounter (Signed)
-----   Message from Alvina Chou sent at 04/09/2023  4:11 PM EDT ----- Regarding: Lab orders for [facility], 10.11.24 Patient is scheduled for CPX labs, please order future labs, Thanks , Camelia Eng

## 2023-04-09 NOTE — Telephone Encounter (Signed)
Received call from pt with complaint of left breast heaviness.  Pt states she underwent left lumpectomy last year.  Pt denies any swelling, redness, or warmth to the breast.  Per MD pt needing to f/u with surgeon.  Pt educated and verbalized understanding.

## 2023-04-09 NOTE — Telephone Encounter (Signed)
Received VM from pt.  RN attempt x1 to return call.  No answer, LVM from pt to return call to the office.

## 2023-04-10 ENCOUNTER — Encounter (HOSPITAL_COMMUNITY): Payer: Self-pay

## 2023-04-10 ENCOUNTER — Ambulatory Visit (HOSPITAL_COMMUNITY)
Admission: EM | Admit: 2023-04-10 | Discharge: 2023-04-10 | Disposition: A | Discharge status: Home / Self Care | Payer: Medicare Other | Attending: Family Medicine | Admitting: Family Medicine

## 2023-04-10 ENCOUNTER — Encounter: Payer: Medicare Other | Admitting: Internal Medicine

## 2023-04-10 ENCOUNTER — Ambulatory Visit (INDEPENDENT_AMBULATORY_CARE_PROVIDER_SITE_OTHER): Payer: Medicare Other

## 2023-04-10 DIAGNOSIS — M79674 Pain in right toe(s): Secondary | ICD-10-CM

## 2023-04-10 DIAGNOSIS — T148XXA Other injury of unspecified body region, initial encounter: Secondary | ICD-10-CM | POA: Diagnosis not present

## 2023-04-10 DIAGNOSIS — S99921A Unspecified injury of right foot, initial encounter: Secondary | ICD-10-CM | POA: Diagnosis not present

## 2023-04-10 NOTE — ED Triage Notes (Signed)
Pt states hit her rt 2nd toe on her wood bed frame. States is on blood thinners and couldn't get to stop bleeding. States put a pressure dressing on it and finally stopped. States very painful.

## 2023-04-10 NOTE — ED Provider Notes (Signed)
[facility]   [phone] 04/10/23 Arrival Time: 1210  ASSESSMENT & PLAN:  1. Toe pain, right   2. Skin avulsion    I have personally viewed and independently interpreted the imaging studies ordered this visit. No fx of R 2nd toe appreciated.  Orders Placed This Encounter  Procedures   DG Toe 2nd Right   Tylenol/ibup as needed. WBAT. Wound care discussed. Band-Aid applied.  Recommend:  Follow-up Information     Excell Seltzer, MD.   Specialty: Family Medicine Why: As needed. Contact information: [address] [place] [address] [phone]                Reviewed expectations re: course of current medical issues. Questions answered. Outlined signs and symptoms indicating need for more acute intervention. Patient verbalized understanding. After Visit Summary given.  SUBJECTIVE: History from: patient. [name] is a 77 y.o. female who states that she hit her rt 2nd toe on her wood bed frame; yest evening. Bleeding has stopped. Toe is still painful. Denies any sensation changes. Ambulatory here.  Past Surgical History:  Procedure Laterality Date   ABDOMINAL HYSTERECTOMY     partial, has ovaries   BREAST LUMPECTOMY WITH RADIOACTIVE SEED LOCALIZATION Left 03/29/2022   Procedure: LEFT BREAST LUMPECTOMY WITH RADIOACTIVE SEED LOCALIZATION;  Surgeon: Harriette Bouillon, MD;  Location: [facility];  Service: General;  Laterality: Left;   CARDIAC CATHETERIZATION  12/28/2010   Mod. pulmonary hypertension, normal coronary arteries   CARDIOVERSION N/A 11/23/2020   Procedure: CARDIOVERSION;  Surgeon: Thurmon Fair, MD;  Location: [facility];  Service: Cardiovascular;  Laterality: N/A;   CHOLECYSTECTOMY N/A 12/20/2021   Procedure: LAPAROSCOPIC CHOLECYSTECTOMY WITH INTRAOPERATIVE CHOLANGIOGRAM;  Surgeon: Darnell Level, MD;  Location: [facility];  Service: General;  Laterality: N/A;   DOPPLER ECHOCARDIOGRAPHY  10/08/2011   EF=>55%,mild asymmetric LVH, mod. TR,  mod. PH, mild to mod LA dilatation   IR LUMBAR DISC ASPIRATION W/IMG GUIDE  01/12/2020   KNEE ARTHROSCOPY Left    KNEE SURGERY     Nuclear Stress Test  05/20/2006   No ischemia   PARTIAL HYSTERECTOMY     PLANTAR FASCIA SURGERY     TONSILLECTOMY        OBJECTIVE:  Vitals:   04/10/23 1249  BP: (!) 156/79  Pulse: 81  Resp: 18  Temp: 98.3 F (36.8 C)  TempSrc: Oral  SpO2: 96%    General appearance: alert; no distress Extremities: RLE: warm with well perfused appearance; small skin avulsion on lateral 2nd toe; without bleeding or signs of infection Skin: warm and dry; no visible rashes Neurologic: gait normal; normal sensation and strength of RLE Psychological: alert and cooperative; normal mood and affect  Imaging: DG Toe 2nd Right  Result Date: 04/10/2023 CLINICAL DATA:  Trauma to the second toe with pain EXAM: RIGHT SECOND TOE COMPARISON:  None Available. FINDINGS: No abnormal finding relating to the second toe. Previous fusion of the inter phalangeal joints of the great toe. Previous fusion of the inter phalangeal joints of the third and fourth toes. Previous surgery with pin at the distal metatarsal of the small toe with partial phalangeal resection. IMPRESSION: No acute finding relating to the second toe. Previous surgery as outlined above. Electronically Signed   By: Paulina Fusi M.D.   On: 04/10/2023 15:25         Allergies  Allergen Reactions   Dilantin [Phenytoin] Swelling    facial swelling   Latex Hives   Oxycodone Nausea  And Vomiting and Nausea Only    Abdominal Pain, Vomiting   Penicillins Nausea And Vomiting and Swelling    Has patient had a PCN reaction causing immediate rash, facial/tongue/throat swelling, SOB or lightheadedness with hypotension patient had a PCN reaction causing severe rash involving mucus membranes or skin necrosis: [id number]} Has patient had a PCN reaction that required hospitalization/No Has patient had a PCN reaction occurring  within the last 10 years: No If all of the above answers are "NO", then may proceed with Cephalosporin use.      Codeine Nausea Only    Tolerates tylenol with codeine   Hydrocodone Nausea And Vomiting   Nickel Hives and Rash   Pregabalin Itching and Other (See Comments)    headaches/problem w/vision    Past Medical History:  Diagnosis Date   Allergic rhinitis    Allergy    Arthritis    Asthma    Breast cancer (HCC)    Chronic headache    Diabetes mellitus    Ductal carcinoma in situ (DCIS) of left breast 02/23/2022   DVT (deep venous thrombosis) (HCC)    Dyspnea    Heart murmur    History of radiation therapy    Left Breast 05/02/22-05/30/22- Dr. Antony Blackbird   Hypertension    Penicillin allergy 01/19/2020   Pulmonary embolism (HCC)    Pulmonary hypertension (HCC)    Seizures (HCC)    Social History   Socioeconomic History   Marital status: Widowed    Spouse name: Not on file   Number of children: Y   Years of education: Not on file   Highest education level: Not on file  Occupational History   Occupation: retired Programme researcher, broadcasting/film/video.   Tobacco Use   Smoking status: Never   Smokeless tobacco: Never  Vaping Use   Vaping status: Never Used  Substance and Sexual Activity   Alcohol use: Not Currently    Alcohol/week: 0.0 standard drinks of alcohol    Comment: occ glass on wine   Drug use: No   Sexual activity: Never  Other Topics Concern   Not on file  Social History Narrative   Widow    limited exercise.   Social Determinants of Health   Financial Resource Strain: Low Risk  (09/12/2022)   Overall Financial Resource Strain (CARDIA)    Difficulty of Paying Living Expenses: Not hard at all  Food Insecurity: No Food Insecurity (09/12/2022)   Hunger Vital Sign    Worried About Running Out of Food in the Last Year: Never true    Ran Out of Food in the Last Year: Never true  Transportation Needs: No Transportation Needs (09/12/2022)   PRAPARE - Therapist, art (Medical): No    Lack of Transportation (Non-Medical): No  Physical Activity: Not on file  Stress: Not on file  Social Connections: Unknown (11/27/2021)   Received from [facility]   Social Network    Social Network: Not on file   Family History  Problem Relation Age of Onset   Cancer Mother        breast and colon cancer   Hypertension Mother    Clotting disorder Mother    Breast cancer Mother 63   Arthritis Mother    Stroke Mother    Diabetes Mother    Colon cancer Mother 53 - 84   Alcohol abuse Father    Cancer Sister        breast cancer  Multiple sclerosis Sister    Cancer Sister        colon cancer   Cancer Brother        colon cancer   Prostate cancer Half-Brother    Stomach cancer Half-Brother    Breast cancer Half-Sister 70       recurrence at 64, reports positive genetic testing   Arthritis Half-Sister    Diabetes Half-Sister    Colon cancer Half-Sister 63   Allergies Other        grandson   Allergic rhinitis Neg Hx    Angioedema Neg Hx    Asthma Neg Hx    Atopy Neg Hx    Eczema Neg Hx    Immunodeficiency Neg Hx    Urticaria Neg Hx    Past Surgical History:  Procedure Laterality Date   ABDOMINAL HYSTERECTOMY     partial, has ovaries   BREAST LUMPECTOMY WITH RADIOACTIVE SEED LOCALIZATION Left 03/29/2022   Procedure: LEFT BREAST LUMPECTOMY WITH RADIOACTIVE SEED LOCALIZATION;  Surgeon: Harriette Bouillon, MD;  Location: [facility];  Service: General;  Laterality: Left;   CARDIAC CATHETERIZATION  12/28/2010   Mod. pulmonary hypertension, normal coronary arteries   CARDIOVERSION N/A 11/23/2020   Procedure: CARDIOVERSION;  Surgeon: Thurmon Fair, MD;  Location: [facility];  Service: Cardiovascular;  Laterality: N/A;   CHOLECYSTECTOMY N/A 12/20/2021   Procedure: LAPAROSCOPIC CHOLECYSTECTOMY WITH INTRAOPERATIVE CHOLANGIOGRAM;  Surgeon: Darnell Level, MD;  Location: [facility];  Service: General;  Laterality: N/A;   DOPPLER  ECHOCARDIOGRAPHY  10/08/2011   EF=>55%,mild asymmetric LVH, mod. TR, mod. PH, mild to mod LA dilatation   IR LUMBAR DISC ASPIRATION W/IMG GUIDE  01/12/2020   KNEE ARTHROSCOPY Left    KNEE SURGERY     Nuclear Stress Test  05/20/2006   No ischemia   PARTIAL HYSTERECTOMY     PLANTAR FASCIA SURGERY     TONSILLECTOMY         Mardella Layman, MD 04/10/23 1534

## 2023-04-11 ENCOUNTER — Ambulatory Visit: Payer: Medicare Other | Admitting: Family Medicine

## 2023-04-15 ENCOUNTER — Other Ambulatory Visit: Payer: Self-pay | Admitting: Cardiovascular Disease

## 2023-04-16 ENCOUNTER — Ambulatory Visit: Payer: Medicare Other | Admitting: Family Medicine

## 2023-04-16 ENCOUNTER — Other Ambulatory Visit: Payer: Self-pay | Admitting: *Deleted

## 2023-04-16 ENCOUNTER — Telehealth: Payer: Self-pay | Admitting: Cardiovascular Disease

## 2023-04-16 DIAGNOSIS — I480 Paroxysmal atrial fibrillation: Secondary | ICD-10-CM

## 2023-04-16 MED ORDER — RIVAROXABAN 20 MG PO TABS: 20.0000 mg | ORAL_TABLET | Freq: Every day | ORAL | 1 refills | Status: DC

## 2023-04-16 NOTE — Telephone Encounter (Signed)
*  STAT* If patient is at the pharmacy, call can be transferred to refill team.   1. Which medications need to be refilled? (please list name of each medication and dose if known)   XARELTO 20 MG TABS tablet   2. Would you like to learn more about the convenience, safety, & potential cost savings by using the [facility]?   3. Are you open to using the [facility] (Type [facility]. ).  4. Which pharmacy/location (including street and city if local pharmacy) is medication to be sent to?  [id number] - [place], [place] - [address]   5. Do they need a 30 day or 90 day supply?   90 day  Patient stated she has 1 tablet left.

## 2023-04-16 NOTE — Telephone Encounter (Signed)
Xarelto 20mg  refill request received. Pt is 77 years old, weight-92.4kg, Crea-1.04 on 04/24/22, last seen by Reather Littler on 02/18/23, Diagnosis-Aflutter, CrCl-67.13 mL/min; Dose is appropriate based on dosing criteria. Will send in refill to requested pharmacy.

## 2023-04-17 ENCOUNTER — Ambulatory Visit: Payer: Medicare Other | Admitting: Family Medicine

## 2023-04-18 ENCOUNTER — Encounter: Payer: Self-pay | Admitting: Family Medicine

## 2023-04-18 ENCOUNTER — Ambulatory Visit: Payer: Medicare Other | Admitting: Family Medicine

## 2023-04-18 VITALS — BP 150/92 | HR 64 | Temp 97.8°F | Ht 64.0 in | Wt 194.6 lb

## 2023-04-18 DIAGNOSIS — S99929A Unspecified injury of unspecified foot, initial encounter: Secondary | ICD-10-CM | POA: Insufficient documentation

## 2023-04-18 DIAGNOSIS — S99921D Unspecified injury of right foot, subsequent encounter: Secondary | ICD-10-CM | POA: Diagnosis not present

## 2023-04-18 NOTE — Assessment & Plan Note (Signed)
Acute, healed injury.  No further concerns.  Encouraged patient to continue to avoid going barefoot.

## 2023-04-18 NOTE — Progress Notes (Signed)
Patient ID: [name], female    DOB: 25-Nov-1945, 77 y.o.   MRN: [id number]  This visit was conducted in person.  BP (!) 150/92 (BP Location: Left Arm, Patient Position: Sitting, Cuff Size: Normal)   Pulse 64   Temp 97.8 F (36.6 C) (Oral)   Ht 5\' 4"  (1.626 m)   Wt 194 lb 9.6 oz (88.3 kg)   SpO2 100%   BMI 33.40 kg/m    CC:  Chief Complaint  Patient presents with   Toe Pain    Right toe follow up    Subjective:   HPI: [name] is a 77 y.o. female presenting on 04/18/2023 for Toe Pain (Right toe follow up)  Patient seen at urgent care on April 10, 2023 following injury to right second toe.  She hit her right second toe on her wood bed frame.  Resulted in bleeding and pain. Able to ambulate. X-ray showed no fracture of second toe, previous surgical changes from fusions noted.  Of note 3 months ago patient treated for cellulitis of right toe treated by wound center. Reviewed last office visit note from March 04, 2023 from wound care center, both great toe and injury to enter webspace second and third toe wounds healed and epithelialized. Discharged.  Today she reports pain has improved. No further issues.  NO bleeding, no pain. Wearing shoes.  No redness.   Driving again. Plans to restart Yoga.      Relevant past medical, surgical, family and social history reviewed and updated as indicated. Interim medical history since our last visit reviewed. Allergies and medications reviewed and updated. Outpatient Medications Prior to Visit  Medication Sig Dispense Refill   ADVAIR HFA 230-21 MCG/ACT inhaler Inhale 2 puffs into the lungs 2 (two) times daily. 2 puffs 1-2 times daily depending on disease activity. 36 g 1   albuterol (PROVENTIL) (2.5 MG/3ML) 0.083% nebulizer solution Take 3 mLs (2.5 mg total) by nebulization every 6 (six) hours as needed for wheezing or shortness of breath. 150 mL 1   albuterol (VENTOLIN HFA) 108 (90 Base) MCG/ACT inhaler INHALE 2  PUFFS BY MOUTH EVERY 6 HOURS AS NEEDED FOR WHEEZING OR SHORTNESS OF BREATH 18 g 1   anastrozole (ARIMIDEX) 1 MG tablet TAKE 1 TABLET(1 MG) BY MOUTH DAILY 90 tablet 3   atorvastatin (LIPITOR) 10 MG tablet TAKE 1 TABLET(10 MG) BY MOUTH DAILY 90 tablet 3   Blood Glucose Monitoring Suppl (ONETOUCH VERIO) w/Device KIT Use to check blood sugar up to 2 times a day 1 kit 0   cyclobenzaprine (FLEXERIL) 10 MG tablet Take 0.5-1 tablets (5-10 mg total) by mouth at bedtime as needed for muscle spasms. 20 tablet 0   desloratadine (CLARINEX) 5 MG tablet TAKE 1 TABLET(5 MG) BY MOUTH DAILY 90 tablet 1   EPINEPHrine 0.3 mg/0.3 mL IJ SOAJ injection USE AS DIRECTED FOR LIFE THREATENING ALLERGIC REACTIONS 2 each 1   estradiol (ESTRACE) 0.1 MG/GM vaginal cream Place 1 Applicatorful vaginally 2 (two) times a week. (As Needed)     famotidine (PEPCID) 40 MG tablet Take 1 (ONE) tablet by mouth every evening. 90 tablet 1   fluticasone (FLOVENT HFA) 220 MCG/ACT inhaler Inhale 2 puffs into the lungs 2 (two) times daily. 2 puffs 2 times daily during flare up. 36 g 1   gabapentin (NEURONTIN) 300 MG capsule Take one capsule in the morning, one at lunch and two at bedtime 360 capsule 3   glucose blood (ONETOUCH VERIO) test  strip 1 strip 2 (two) times daily     GLYXAMBI 10-5 MG TABS TAKE 1 TABLET BY MOUTH DAILY 30 tablet 5   hydrOXYzine (ATARAX/VISTARIL) 10 MG tablet Take 1 tablet (10 mg total) by mouth daily as needed for itching. 30 tablet 2   ipratropium (ATROVENT) 0.06 % nasal spray Place 2 sprays into both nostrils 2 (two) times daily as needed for rhinitis. 45 mL 1   irbesartan (AVAPRO) 150 MG tablet TAKE 1 TABLET(150 MG) BY MOUTH DAILY 90 tablet 2   Lactase (DAIRY-RELIEF PO) Take 1 capsule by mouth daily as needed (eating dairy).      Lancets (ONETOUCH DELICA PLUS LANCET33G) MISC USE TO CHECK BLOOD SUGAR UP TO TWICE DAILY 100 each 5   levalbuterol (XOPENEX) 1.25 MG/3ML nebulizer solution USE 1 VIAL VIA NEBULIZER EVERY 6  HOURS AS NEEDED FOR SHORTNESS OF BREATH OR WHEEZING 90 mL 3   Magnesium 500 MG TABS Take 500 mg by mouth daily.     Menthol, Topical Analgesic, (ABSORBINE PLUS JR EX) Apply 1 patch topically daily as needed (pain).     Menthol-Methyl Salicylate (SALONPAS JET SPRAY EX) Apply 1 spray topically daily as needed (knee pain).     montelukast (SINGULAIR) 10 MG tablet Take 1 tablet (10 mg total) by mouth at bedtime. For asthma control. 90 tablet 1   Mouthwashes (BIOTENE DRY MOUTH MT) Use as directed 1 Dose in the mouth or throat daily as needed (dry mouth).     Nebulizer MISC 1 Device by Does not apply route as directed. 1 each 1   ondansetron (ZOFRAN-ODT) 4 MG disintegrating tablet Take 1 tablet (4 mg total) by mouth every 8 (eight) hours as needed for nausea or vomiting. (Patient taking differently: Take 4 mg by mouth every 8 (eight) hours as needed for nausea or vomiting.) 20 tablet 0   pantoprazole (PROTONIX) 40 MG tablet Take 1 tablet (40 mg total) by mouth 2 (two) times daily. Take 1 (ONE) tablet by mouth every morning. 180 tablet 1   Plecanatide (TRULANCE) 3 MG TABS Take 3 mg by mouth daily as needed (constipation).     Polyethyl Glycol-Propyl Glycol (SYSTANE OP) Place 1 drop into both eyes daily as needed (dry eyes).     polyethylene glycol powder (GLYCOLAX/MIRALAX) 17 GM/SCOOP powder Take 1 Container by mouth once.     PRESCRIPTION MEDICATION Inhale into the lungs at bedtime. CPAP     Respiratory Therapy Supplies (NEBULIZER MASK ADULT) MISC 1 kit by Does not apply route as directed. 1 each 1   rivaroxaban (XARELTO) 20 MG TABS tablet Take 1 tablet (20 mg total) by mouth daily with supper. 90 tablet 1   Tezepelumab-ekko (TEZSPIRE ) Inject into the skin every 30 (thirty) days.     tolterodine (DETROL LA) 4 MG 24 hr capsule Take 4 mg by mouth at bedtime.     triamcinolone (NASACORT) 55 MCG/ACT AERO nasal inhaler Place 2 sprays into the nose daily. 50.7 mL 1   Facility-Administered Medications Prior  to Visit  Medication Dose Route Frequency Provider Last Rate Last Admin   tezepelumab-ekko (TEZSPIRE) 210 MG/1. syringe 210 mg  210 mg Subcutaneous Q28 days Jessica Priest, MD   210 mg at 04/09/23 1318     Per HPI unless specifically indicated in ROS section below Review of Systems  Constitutional:  Negative for fatigue and fever.  HENT:  Negative for congestion.   Eyes:  Negative for pain.  Respiratory:  Negative for cough and shortness  of breath.   Cardiovascular:  Negative for chest pain, palpitations and leg swelling.  Gastrointestinal:  Negative for abdominal pain.  Genitourinary:  Negative for dysuria and vaginal bleeding.  Musculoskeletal:  Negative for back pain.  Neurological:  Negative for syncope, light-headedness and headaches.  Psychiatric/Behavioral:  Negative for dysphoric mood.    Objective:  BP (!) 150/92 (BP Location: Left Arm, Patient Position: Sitting, Cuff Size: Normal)   Pulse 64   Temp 97.8 F (36.6 C) (Oral)   Ht 5\' 4"  (1.626 m)   Wt 194 lb 9.6 oz (88.3 kg)   SpO2 100%   BMI 33.40 kg/m   Wt Readings from Last 3 Encounters:  04/18/23 194 lb 9.6 oz (88.3 kg)  03/12/23 203 lb 9.6 oz (92.4 kg)  02/18/23 198 lb (89.8 kg)      Physical Exam Constitutional:      General: She is not in acute distress.    Appearance: Normal appearance. She is well-developed. She is not ill-appearing or toxic-appearing.  HENT:     Head: Normocephalic.     Right Ear: Hearing, tympanic membrane, ear canal and external ear normal. Tympanic membrane is not erythematous, retracted or bulging.     Left Ear: Hearing, tympanic membrane, ear canal and external ear normal. Tympanic membrane is not erythematous, retracted or bulging.     Nose: No mucosal edema or rhinorrhea.     Right Sinus: No maxillary sinus tenderness or frontal sinus tenderness.     Left Sinus: No maxillary sinus tenderness or frontal sinus tenderness.     Mouth/Throat:     Mouth: Oropharynx is clear and moist  and mucous membranes are normal.     Pharynx: Uvula midline.  Eyes:     General: Lids are normal. Lids are everted, no foreign bodies appreciated.     Extraocular Movements: EOM normal.     Conjunctiva/sclera: Conjunctivae normal.     Pupils: Pupils are equal, round, and reactive to light.  Neck:     Thyroid: No thyroid mass or thyromegaly.     Vascular: No carotid bruit.     Trachea: Trachea normal.  Cardiovascular:     Rate and Rhythm: Normal rate and regular rhythm.     Pulses: Normal pulses.          Dorsalis pedis pulses are 2+ on the right side.       Posterior tibial pulses are 2+ on the right side.     Heart sounds: Normal heart sounds, S1 normal and S2 normal. No murmur heard.    No friction rub. No gallop.  Pulmonary:     Effort: Pulmonary effort is normal. No tachypnea or respiratory distress.     Breath sounds: Normal breath sounds. No decreased breath sounds, wheezing, rhonchi or rales.  Abdominal:     General: Bowel sounds are normal.     Palpations: Abdomen is soft.     Tenderness: There is no abdominal tenderness.  Musculoskeletal:     Cervical back: Normal range of motion and neck supple.     Right foot: Normal range of motion. Bunion present. No Charcot foot or foot drop.     Left foot: Normal range of motion.  Feet:     Right foot:     Skin integrity: Callus and dry skin present. No ulcer, blister, skin breakdown, erythema, warmth or fissure.     Left foot:     Skin integrity: Skin integrity normal.  Skin:    General: Skin is  warm, dry and intact.     Findings: No rash.  Neurological:     Mental Status: She is alert.  Psychiatric:        Mood and Affect: Mood is not anxious or depressed.        Speech: Speech normal.        Behavior: Behavior normal. Behavior is cooperative.        Thought Content: Thought content normal.        Cognition and Memory: Cognition and memory normal.        Judgment: Judgment normal.       Results for orders placed or  performed in visit on 03/19/23  T3, free  Result Value Ref Range   T3, Free 3.3 2.3 - 4.2 pg/mL  T4, free  Result Value Ref Range   Free T4 1.02 0.60 - 1.60 ng/dL  TSH  Result Value Ref Range   TSH 3.45 0.35 - 5.50 uIU/mL   *Note: Due to a large number of results and/or encounters for the requested time period, some results have not been displayed. A complete set of results can be found in Results Review.    Assessment and Plan  Injury of second toe, right, subsequent encounter Assessment & Plan: Acute, healed injury.  No further concerns.  Encouraged patient to continue to avoid going barefoot.     No follow-ups on file.   Kerby Nora, MD

## 2023-04-19 ENCOUNTER — Encounter: Payer: Self-pay | Admitting: Cardiovascular Disease

## 2023-04-19 ENCOUNTER — Ambulatory Visit: Payer: Medicare Other | Attending: Cardiovascular Disease | Admitting: Cardiovascular Disease

## 2023-04-19 VITALS — BP 132/78 | HR 73 | Ht 64.0 in | Wt 201.1 lb

## 2023-04-19 DIAGNOSIS — D6869 Other thrombophilia: Secondary | ICD-10-CM | POA: Diagnosis not present

## 2023-04-19 DIAGNOSIS — E0590 Thyrotoxicosis, unspecified without thyrotoxic crisis or storm: Secondary | ICD-10-CM

## 2023-04-19 DIAGNOSIS — I10 Essential (primary) hypertension: Secondary | ICD-10-CM | POA: Diagnosis not present

## 2023-04-19 DIAGNOSIS — Z853 Personal history of malignant neoplasm of breast: Secondary | ICD-10-CM

## 2023-04-19 DIAGNOSIS — J4540 Moderate persistent asthma, uncomplicated: Secondary | ICD-10-CM

## 2023-04-19 DIAGNOSIS — E1140 Type 2 diabetes mellitus with diabetic neuropathy, unspecified: Secondary | ICD-10-CM | POA: Diagnosis not present

## 2023-04-19 DIAGNOSIS — E7800 Pure hypercholesterolemia, unspecified: Secondary | ICD-10-CM

## 2023-04-19 DIAGNOSIS — I484 Atypical atrial flutter: Secondary | ICD-10-CM | POA: Diagnosis not present

## 2023-04-19 DIAGNOSIS — I495 Sick sinus syndrome: Secondary | ICD-10-CM

## 2023-04-19 DIAGNOSIS — Z86711 Personal history of pulmonary embolism: Secondary | ICD-10-CM | POA: Diagnosis not present

## 2023-04-19 DIAGNOSIS — G4739 Other sleep apnea: Secondary | ICD-10-CM

## 2023-04-19 DIAGNOSIS — I2721 Secondary pulmonary arterial hypertension: Secondary | ICD-10-CM

## 2023-04-19 DIAGNOSIS — E669 Obesity, unspecified: Secondary | ICD-10-CM | POA: Diagnosis not present

## 2023-04-19 DIAGNOSIS — I5032 Chronic diastolic (congestive) heart failure: Secondary | ICD-10-CM

## 2023-04-19 NOTE — Progress Notes (Signed)
Cardiology Office Note    Date:  04/19/2023   ID:  Price, Alicia 26-Apr-1946, MRN [id number]  PCP:  Excell Seltzer, MD  Cardiologist:   Thurmon Fair, MD    Chief Complaint  Patient presents with   Atrial Flutter    History of Present Illness:  [name] is a 77 y.o. female with history of persistent atrial flutter recurrent after DCCV 11/23/2020, paroxysmal atrial fibrillation, remote DVT/PE, moderate pulmonary hypertension, chronic reactive airway disease and degenerative joint disease returning for follow-up.  In 2023 she had knee replacement surgery, emergency cholecystectomy and left breast cancer lumpectomy followed by radiation therapy.  This year has been much better.  She has not had any serious medical illnesses.  She is getting along well, living independently she does complain of dyspnea when the weather is hot, but is much better now in the fall.  She has occasional very mild pedal edema.  She denies orthopnea, PND, chest pain at rest or with activity, palpitations, dizziness and syncope.  She nicked her toe cutting her toenails, bleeding would not stop so she extracted her to the emergency room.  Otherwise has not had any bleeding problems.  Has not had falls.  She is metabolically well-controlled with the most recent LDL 40, HDL 56, hemoglobin A1c 5.6%.  Her last TSH performed just 2 weeks ago was 3.450.  She is no longer on methimazole and has an appointment with her endocrinologist later this month.  The last time she was clearly in sinus rhythm was back in October 2023 when she had sinus bradycardia.  Since then she has had several ECGs that show atrial flutter with spontaneously controlled ventricular response, most commonly 4: 1 AV block with a ventricular rate around 70 bpm.  She is completely oblivious to the arrhythmia and it does not seem to have any impact on her functional status.     Previous coronary angiography showed no evidence of CAD but  she did have moderate pulmonary artery hypertension by right heart catheterization (systolic PAP 50 mmHg).  She has had a variety of noncardiac issues including recurrent issues with diarrhea (she is seeing Dr. Matthias Hughs) and continued hyperthyroidism controlled on methimazole (she is seeing Dr. Elvera Lennox).    She continues to have issues with thoracic spine related pain (Dr. Darrelyn Hillock). She has had a previous equivocal workup for hypercoagulable conditions (lupus anticoagulant positive on one of 2 separate assays, protein S activity decreased with normal total protein S level).  She had elective right knee replacement surgery in March 2023, cholecystectomy for acute cholecystitis in June 2023 and left breast lumpectomy for cancer in September 2023.   Allergies:   Dilantin [phenytoin], Latex, Oxycodone, Penicillins, Codeine, Hydrocodone, Nickel, and Pregabalin   ROS:   Please see the history of present illness.    ROS All other systems reviewed and are negative.   PHYSICAL EXAM:   VS:  BP 132/78   Pulse 73   Ht 5\' 4"  (1.626 m)   Wt 201 lb 1.6 oz (91.2 kg)   SpO2 96%   BMI 34.52 kg/m      General: Alert, oriented x3, no distress, mildly obese Head: no evidence of trauma, PERRL, EOMI, no exophtalmos or lid lag, no myxedema, no xanthelasma; normal ears, nose and oropharynx Neck: normal jugular venous pulsations with visible flutter waves; brisk carotid pulses without delay and no carotid bruits Chest: clear to auscultation, no signs of consolidation by percussion or palpation, normal  fremitus, symmetrical and full respiratory excursions Cardiovascular: normal position and quality of the apical impulse, regular rhythm background with periods of irregularity, normal first and second heart sounds, no murmurs, rubs or gallops Abdomen: no tenderness or distention, no masses by palpation, no abnormal pulsatility or arterial bruits, normal bowel sounds, no hepatosplenomegaly Extremities: no clubbing,  cyanosis or edema; 2+ radial, ulnar and brachial pulses bilaterally; 2+ right femoral, posterior tibial and dorsalis pedis pulses; 2+ left femoral, posterior tibial and dorsalis pedis pulses; no subclavian or femoral bruits Neurological: grossly nonfocal Psych: Normal mood and affect    Wt Readings from Last 3 Encounters:  04/19/23 201 lb 1.6 oz (91.2 kg)  04/18/23 194 lb 9.6 oz (88.3 kg)  03/12/23 203 lb 9.6 oz (92.4 kg)      Studies/Labs Reviewed:  Echocardiogram 11/07/2020   1. Rhythm appears to be atrial flutter with variable conduction. Left  ventricular ejection fraction, by estimation, is 60 to 65%. The left  ventricle has normal function. The left ventricle has no regional wall  motion abnormalities. There is moderate  asymmetric left ventricular hypertrophy of the basal-septal segment. Left  ventricular diastolic parameters are indeterminate.   2. Right ventricular systolic function is normal. The right ventricular  size is normal. There is mildly elevated pulmonary artery systolic  pressure. The estimated right ventricular systolic pressure is 44.0 mmHg.   3. Left atrial size was mildly dilated.   4. Right atrial size was moderately dilated.   5. The mitral valve is normal in structure. Trivial mitral valve  regurgitation. No evidence of mitral stenosis.   6. Tricuspid valve regurgitation is moderate.   7. The aortic valve is tricuspid. Aortic valve regurgitation is not  visualized. No aortic stenosis is present.   8. The inferior vena cava is normal in size with greater than 50%  respiratory variability, suggesting right atrial pressure of 3 mmHg.   EKG:  EKG is not ordered today.   Recent Labs: 04/24/2022: ALT 9; BUN 13; Creatinine, Ser 1.04; Potassium 4.2; Sodium 140 03/19/2023: TSH 3.45   Lipid Panel    Component Value Date/Time   CHOL 106 04/24/2022 1003   TRIG 49.0 04/24/2022 1003   HDL 56.40 04/24/2022 1003   CHOLHDL 2 04/24/2022 1003   VLDL 9.8  04/24/2022 1003   LDLCALC 40 04/24/2022 1003     ASSESSMENT:    1. Atypical atrial flutter (HCC)   2. SSS (sick sinus syndrome) (HCC)   3. Chronic diastolic heart failure (HCC)   4. Essential hypertension   5. History of pulmonary embolism   6. PAH (pulmonary artery hypertension) (HCC)   7. Type 2 diabetes mellitus with diabetic neuropathy, without long-term current use of insulin (HCC)   8. Hypercholesterolemia   9. Moderate obesity   10. Complex sleep apnea syndrome   11. Acquired thrombophilia (HCC)   12. Asthma, moderate persistent, well-controlled   13. Hyperthyroidism   14. History of left breast cancer       PLAN:  In order of problems listed above:  Paroxysmal atrial fibrillation and persistent atrial lutter: It appears that she has been in atrial flutter persistently for possibly as long as a year now.  She is not aware of palpitations and the arrhythmia seems to have no impact on her functional status.  CHADSVasc 4 (age, gender, HTN, DM, CHF).  She also has a previous history of paroxysmal atrial fibrillation.  We did not do an EKG today, but her physical exam is strongly suggestive  of atrial flutter with variable AV block.  The ventricular rate is spontaneously rate controlled, suggesting she has some degree of AV node disease.   There appears to be no compelling reason to proceed with cardioversion. SSS/ AV block: She is not on any medications with negative chronotropic, is clinically euthyroid and has evidence of both sinus node dysfunction and AV no disease.  It is highly likely that she will eventually require pacemaker, but this is not imminent.  CHF: This was only a problem when she had a prolonged episode of persistent atrial flutter and she is currently asymptomatic, not requiring diuretics.  On SGLT2 inhibitor.  She has preserved left ventricular systolic function.  Aware of the need to avoid sodium rich foods and she is monitoring her weight regularly.  Should call  us for rapid weight gain. HTN:   Well-controlled.  On irbesartan. Hx DVT/PE: Questionable hypercoagulable state, but she requires lifelong anticoagulation for atrial arrhythmia anyway.   PAH: Currently asymptomatic other than occasional mild pedal edema.  Probably multifactorial related to left heart diastolic dysfunction, previous pulm embolism, longstanding obesity.  Mild, with estimated systolic PA pressure of on the most recent echocardiogram, similar to 2016 when it was 46 mmHg.   DM: Excellent control, on empagliflozin. HLP: Excellent on current regimen.  Continue atorvastatin. Obesity: Remains in moderately obese range. Complex sleep apnea: Reports compliance with CPAP and denies daytime hypersomnolence (via [facility]). Xarelto: Bleeding complications but these have been relatively minor and easily controlled.  Anticoagulation is indicated both for atrial fibrillation and history of venous thromboembolism.  Asthma: Avoid nonselective beta-blockers. On Xolair, Spiriva, albuterol/levalbuterol, montelukast, inhaled steroids/LABA (Advair). Dr. Lucie Leather. Hyperthyroidism: Currently euthyroid clinically and based on recent TSH. Left breast cancer status postlumpectomy 03/29/2022 and subsequent radiation therapy   Medication Adjustments/Labs and Tests Ordered: Current medicines are reviewed at length with the patient today.  Concerns regarding medicines are outlined above.  Medication changes, Labs and Tests ordered today are listed in the Patient Instructions below: Patient Instructions  Medication Instructions:  No changes *If you need a refill on your cardiac medications before your next appointment, please call your pharmacy*  Follow-Up: At [facility], you and your health needs are our priority.  As part of our continuing mission to provide you with exceptional heart care, we have created designated Provider Care Teams.  These Care Teams include your primary Cardiologist  (physician) and Advanced Practice Providers (APPs -  Physician Assistants and Nurse Practitioners) who all work together to provide you with the care you need, when you need it.  We recommend signing up for the patient portal called "MyChart".  Sign up information is provided on this After Visit Summary.  MyChart is used to connect with patients for Virtual Visits (Telemedicine).  Patients are able to view lab/test results, encounter notes, upcoming appointments, etc.  Non-urgent messages can be sent to your provider as well.   To learn more about what you can do with MyChart, go to [email or web address].    Your next appointment:   1 year(s)  Provider:   Thurmon Fair, MD       Signed, Thurmon Fair, MD  04/19/2023 2:25 PM     [address], [place], [place]  [address] Phone: [phone]; Fax: [phone]

## 2023-04-19 NOTE — Patient Instructions (Signed)
Medication Instructions:  No changes *If you need a refill on your cardiac medications before your next appointment, please call your pharmacy*  Follow-Up: At [facility], you and your health needs are our priority.  As part of our continuing mission to provide you with exceptional heart care, we have created designated Provider Care Teams.  These Care Teams include your primary Cardiologist (physician) and Advanced Practice Providers (APPs -  Physician Assistants and Nurse Practitioners) who all work together to provide you with the care you need, when you need it.  We recommend signing up for the patient portal called "MyChart".  Sign up information is provided on this After Visit Summary.  MyChart is used to connect with patients for Virtual Visits (Telemedicine).  Patients are able to view lab/test results, encounter notes, upcoming appointments, etc.  Non-urgent messages can be sent to your provider as well.   To learn more about what you can do with MyChart, go to [email or web address].    Your next appointment:   1 year(s)  Provider:   Mihai Croitoru, MD      

## 2023-04-23 DIAGNOSIS — M25561 Pain in right knee: Secondary | ICD-10-CM | POA: Diagnosis not present

## 2023-04-23 DIAGNOSIS — M79652 Pain in left thigh: Secondary | ICD-10-CM | POA: Diagnosis not present

## 2023-04-25 ENCOUNTER — Encounter (HOSPITAL_BASED_OUTPATIENT_CLINIC_OR_DEPARTMENT_OTHER): Payer: Self-pay | Admitting: Emergency Medicine

## 2023-04-25 ENCOUNTER — Telehealth: Payer: Self-pay | Admitting: Cardiovascular Disease

## 2023-04-25 ENCOUNTER — Emergency Department (HOSPITAL_BASED_OUTPATIENT_CLINIC_OR_DEPARTMENT_OTHER)
Admission: EM | Admit: 2023-04-25 | Discharge: 2023-04-25 | Disposition: A | Discharge status: Home / Self Care | Payer: Medicare Other | Attending: Emergency Medicine | Admitting: Emergency Medicine

## 2023-04-25 ENCOUNTER — Other Ambulatory Visit: Payer: Self-pay

## 2023-04-25 ENCOUNTER — Emergency Department (HOSPITAL_BASED_OUTPATIENT_CLINIC_OR_DEPARTMENT_OTHER): Payer: Medicare Other | Admitting: Radiology

## 2023-04-25 DIAGNOSIS — I10 Essential (primary) hypertension: Secondary | ICD-10-CM | POA: Insufficient documentation

## 2023-04-25 DIAGNOSIS — I4891 Unspecified atrial fibrillation: Secondary | ICD-10-CM | POA: Insufficient documentation

## 2023-04-25 DIAGNOSIS — Y9301 Activity, walking, marching and hiking: Secondary | ICD-10-CM | POA: Diagnosis not present

## 2023-04-25 DIAGNOSIS — S8991XA Unspecified injury of right lower leg, initial encounter: Secondary | ICD-10-CM | POA: Diagnosis not present

## 2023-04-25 DIAGNOSIS — S8001XA Contusion of right knee, initial encounter: Secondary | ICD-10-CM | POA: Diagnosis not present

## 2023-04-25 DIAGNOSIS — Z79899 Other long term (current) drug therapy: Secondary | ICD-10-CM | POA: Diagnosis not present

## 2023-04-25 DIAGNOSIS — T148XXA Other injury of unspecified body region, initial encounter: Secondary | ICD-10-CM

## 2023-04-25 DIAGNOSIS — W07XXXA Fall from chair, initial encounter: Secondary | ICD-10-CM | POA: Insufficient documentation

## 2023-04-25 DIAGNOSIS — Z7901 Long term (current) use of anticoagulants: Secondary | ICD-10-CM | POA: Diagnosis not present

## 2023-04-25 DIAGNOSIS — M25561 Pain in right knee: Secondary | ICD-10-CM | POA: Diagnosis not present

## 2023-04-25 DIAGNOSIS — Z91040 Latex allergy status: Secondary | ICD-10-CM | POA: Insufficient documentation

## 2023-04-25 NOTE — ED Provider Notes (Signed)
[place] EMERGENCY DEPARTMENT AT [facility] Provider Note   CSN: [id number] Arrival date & time: 04/25/23  2018     History  Chief Complaint  Patient presents with   Leg Injury    [name] is a 77 y.o. female.  77 year old female with history of atrial fibrillation and PE on Xarelto, pulmonary hypertension, and total knee replacement of the right knee in 2023 who presents to the emergency department with knee pain.  On Saturday the patient was walking tripped on a chair falling to her right knee.  Started having pain and swelling that day.  Noticed some progressive bruising since then.  Saw her orthopedist who recommended that she heat it and elevate it which she has been doing.  Primary care doctor saw her knee today and referred her to the emergency department.  Says that today the swelling is actually improved from prior.  No other injuries as a result of the fall.       Home Medications Prior to Admission medications   Medication Sig Start Date End Date Taking? Authorizing Provider  ADVAIR HFA 230-21 MCG/ACT inhaler Inhale 2 puffs into the lungs 2 (two) times daily. 2 puffs 1-2 times daily depending on disease activity. 02/12/23   Kozlow, Alvira Philips, MD  albuterol (PROVENTIL) (2.5 MG/3ML) 0.083% nebulizer solution Take 3 mLs (2.5 mg total) by nebulization every 6 (six) hours as needed for wheezing or shortness of breath. Patient not taking: Reported on 04/19/2023 02/12/23   Jessica Priest, MD  albuterol (VENTOLIN HFA) 108 (90 Base) MCG/ACT inhaler INHALE 2 PUFFS BY MOUTH EVERY 6 HOURS AS NEEDED FOR WHEEZING OR SHORTNESS OF BREATH Patient not taking: Reported on 04/19/2023 02/12/23   Kozlow, Alvira Philips, MD  anastrozole (ARIMIDEX) 1 MG tablet TAKE 1 TABLET(1 MG) BY MOUTH DAILY 12/12/22   Serena Croissant, MD  atorvastatin (LIPITOR) 10 MG tablet TAKE 1 TABLET(10 MG) BY MOUTH DAILY 06/03/22   Bedsole, Amy E, MD  Blood Glucose Monitoring Suppl (ONETOUCH VERIO) w/Device KIT Use to  check blood sugar up to 2 times a day 08/16/20   Ermalene Searing, Amy E, MD  cyclobenzaprine (FLEXERIL) 10 MG tablet Take 0.5-1 tablets (5-10 mg total) by mouth at bedtime as needed for muscle spasms. Patient not taking: Reported on 04/19/2023 03/09/22   Excell Seltzer, MD  desloratadine (CLARINEX) 5 MG tablet TAKE 1 TABLET(5 MG) BY MOUTH DAILY 02/04/23   Kozlow, Alvira Philips, MD  EPINEPHrine 0.3 mg/0.3 mL IJ SOAJ injection USE AS DIRECTED FOR LIFE THREATENING ALLERGIC REACTIONS Patient not taking: Reported on 04/19/2023 02/12/23   Kozlow, Alvira Philips, MD  estradiol (ESTRACE) 0.1 MG/GM vaginal cream Place 1 Applicatorful vaginally 2 (two) times a week. (As Needed)    [provider]  famotidine (PEPCID) 40 MG tablet Take 1 (ONE) tablet by mouth every evening. 02/12/23   Kozlow, Alvira Philips, MD  fluticasone (FLOVENT HFA) 220 MCG/ACT inhaler Inhale 2 puffs into the lungs 2 (two) times daily. 2 puffs 2 times daily during flare up. 02/12/23   Kozlow, Alvira Philips, MD  gabapentin (NEURONTIN) 300 MG capsule Take one capsule in the morning, one at lunch and two at bedtime 09/05/22   Hyatt, Max T, DPM  glucose blood (ONETOUCH VERIO) test strip 1 strip 2 (two) times daily 01/29/22   [provider]  GLYXAMBI 10-5 MG TABS TAKE 1 TABLET BY MOUTH DAILY 12/03/22   Bedsole, Amy E, MD  hydrOXYzine (ATARAX/VISTARIL) 10 MG tablet Take 1 tablet (10 mg  total) by mouth daily as needed for itching. Patient not taking: Reported on 04/19/2023 04/18/21   Excell Seltzer, MD  ipratropium (ATROVENT) 0.06 % nasal spray Place 2 sprays into both nostrils 2 (two) times daily as needed for rhinitis. Patient not taking: Reported on 04/19/2023 02/12/23   Jessica Priest, MD  irbesartan (AVAPRO) 150 MG tablet TAKE 1 TABLET(150 MG) BY MOUTH DAILY 04/17/23   Croitoru, Mihai, MD  Lactase (DAIRY-RELIEF PO) Take 1 capsule by mouth daily as needed (eating dairy).  Patient not taking: Reported on 04/19/2023    [provider]  Lancets (ONETOUCH DELICA PLUS  LANCET33G) MISC USE TO CHECK BLOOD SUGAR UP TO TWICE DAILY Patient not taking: Reported on 04/19/2023 01/29/22   Bedsole, Amy E, MD  levalbuterol (XOPENEX) 1.25 MG/3ML nebulizer solution USE 1 VIAL VIA NEBULIZER EVERY 6 HOURS AS NEEDED FOR SHORTNESS OF BREATH OR WHEEZING Patient not taking: Reported on 04/19/2023 09/10/22   Jessica Priest, MD  Magnesium 500 MG TABS Take 500 mg by mouth 2 (two) times daily.    [provider]  Menthol, Topical Analgesic, (ABSORBINE PLUS JR EX) Apply 1 patch topically daily as needed (pain). Patient not taking: Reported on 04/19/2023    [provider]  Menthol-Methyl Salicylate (SALONPAS JET SPRAY EX) Apply 1 spray topically daily as needed (knee pain). Patient not taking: Reported on 04/19/2023    [provider]  montelukast (SINGULAIR) 10 MG tablet Take 1 tablet (10 mg total) by mouth at bedtime. For asthma control. 02/12/23   Kozlow, Alvira Philips, MD  Mouthwashes (BIOTENE DRY MOUTH MT) Use as directed 1 Dose in the mouth or throat daily as needed (dry mouth).    [provider]  Nebulizer MISC 1 Device by Does not apply route as directed. 02/12/23   Kozlow, Alvira Philips, MD  omeprazole (PRILOSEC) 40 MG capsule Take 40 mg by mouth every morning. 02/28/23   [provider]  ondansetron (ZOFRAN-ODT) 4 MG disintegrating tablet Take 1 tablet (4 mg total) by mouth every 8 (eight) hours as needed for nausea or vomiting. Patient not taking: Reported on 04/19/2023 02/20/23   Excell Seltzer, MD  Plecanatide (TRULANCE) 3 MG TABS Take 3 mg by mouth daily as needed (constipation).    [provider]  Polyethyl Glycol-Propyl Glycol (SYSTANE OP) Place 1 drop into both eyes daily as needed (dry eyes).    [provider]  polyethylene glycol powder (GLYCOLAX/MIRALAX) 17 GM/SCOOP powder Take 1 Container by mouth once.    [provider]  PRESCRIPTION MEDICATION Inhale into the lungs at bedtime. CPAP    [provider]   Respiratory Therapy Supplies (NEBULIZER MASK ADULT) MISC 1 kit by Does not apply route as directed. 01/15/23   Kozlow, Alvira Philips, MD  rivaroxaban (XARELTO) 20 MG TABS tablet Take 1 tablet (20 mg total) by mouth daily with supper. 04/16/23   Croitoru, Mihai, MD  Tezepelumab-ekko (TEZSPIRE [place]) Inject into the skin every 30 (thirty) days.    [provider]  tolterodine (DETROL LA) 4 MG 24 hr capsule Take 4 mg by mouth at bedtime.    Alfredo Martinez, MD  triamcinolone (NASACORT) 55 MCG/ACT AERO nasal inhaler Place 2 sprays into the nose daily. 02/12/23   Kozlow, Alvira Philips, MD      Allergies    Dilantin [phenytoin], Latex, Oxycodone, Penicillins, Codeine, Hydrocodone, Nickel, and Pregabalin    Review of Systems   Review of Systems  Physical Exam Updated Vital Signs BP Marland Kitchen)  170/86   Pulse 60   Temp 98 F (36.7 C) (Oral)   Resp 17   Ht 5\' 4"  (1.626 m)   Wt 91.2 kg   SpO2 100%   BMI 34.50 kg/m  Physical Exam Constitutional:      Appearance: Normal appearance.  HENT:     Head: Normocephalic and atraumatic.  Musculoskeletal:     Right lower leg: Edema present.     Left lower leg: Edema present.     Comments: 3 cm diameter hematoma on right patella.  Scars from prior surgery.  Full range of motion of the right knee.  No obvious deformities.  DP pulses 2+ bilaterally.  Neurological:     Mental Status: She is alert.     ED Results / Procedures / Treatments   Labs (all labs ordered are listed, but only abnormal results are displayed) Labs Reviewed - No data to display  EKG None  Radiology DG Knee Complete 4 Views Right  Result Date: 04/25/2023 CLINICAL DATA:  Mechanical fall hematoma on anterior knee that has gotten worse. 8/10 pain. EXAM: RIGHT KNEE - COMPLETE 4+ VIEW COMPARISON:  Knee radiographs 10/16/2021 FINDINGS: Right TKA without radiographic evidence of loosening. No acute fracture or dislocation. No knee joint effusion. Soft tissue bulge about the infrapatellar soft  tissues compatible with hematoma. IMPRESSION: 1. No acute fracture. 2. Infrapatellar soft tissue hematoma. Electronically Signed   By: Minerva Fester M.D.   On: 04/25/2023 21:51    Procedures Procedures    Medications Ordered in ED Medications - No data to display  ED Course/ Medical Decision Making/ A&P                                 Medical Decision Making Amount and/or Complexity of Data Reviewed Radiology: ordered.   LACHAE HOHLER is a 77 y.o. female with comorbidities that complicate the patient evaluation including atrial fibrillation and PE on Xarelto, pulmonary hypertension, and total knee replacement of the right knee in 2023 who presents to the emergency department with knee pain.    Initial Ddx:  Hematoma, abscess, periprosthetic fracture  MDM/Course:  Patient resents emergency department hematoma on her knee that was sustained after trauma to her knee.  Appear to be from a mechanical fall several days ago.  No other injuries as a result of the fall.  Still able to bear weight.  Has already seen orthopedics but her primary doctor referred her in.  On exam appears to have a large hematoma.  No erythema or warmth that would be concerning for an abscess.  Had x-rays that did not reveal a periprosthetic fracture.  Upon re-evaluation patient was doing well.  Counseled to continue her warm compresses, Tylenol, and elevation for the hematoma.  Will have her follow-up with orthopedics.  This patient presents to the ED for concern of complaints listed in HPI, this involves an extensive number of treatment options, and is a complaint that carries with it a high risk of complications and morbidity. Disposition including potential need for admission considered.   Dispo: DC Home. Return precautions discussed including, but not limited to, those listed in the AVS. Allowed pt time to ask questions which were answered fully prior to dc.  Additional history obtained from  daughter Records reviewed Outpatient Clinic Notes I independently reviewed the following imaging with scope of interpretation limited to determining acute life threatening conditions related to emergency care: Extremity  x-ray(s) and agree with the radiologist interpretation with the following exceptions: none I have reviewed the patients home medications and made adjustments as needed Social Determinants of health:  Elderly  Portions of this note were generated with Scientist, clinical (histocompatibility and immunogenetics). Dictation errors may occur despite best attempts at proofreading.           Final Clinical Impression(s) / ED Diagnoses Final diagnoses:  Hematoma  Injury of right knee, initial encounter    Rx / DC Orders ED Discharge Orders     None         Rondel Baton, MD 04/26/23 1540

## 2023-04-25 NOTE — ED Notes (Signed)
 RN reviewed discharge instructions with pt. Pt verbalized understanding and had no further questions. VSS upon discharge.  

## 2023-04-25 NOTE — Telephone Encounter (Signed)
Pt is requesting a callback regarding her falling recently. She'd like to speak with the nurse since her knee is bruised and she's had a blood clot before. Please advise

## 2023-04-25 NOTE — ED Triage Notes (Signed)
  Patient comes in with R leg injury sustained from a mechanical fall that occurred on 10/5.  Was seen by her Orthopedist on 10/8 due to her being on eliquis.  Patient has hematoma on anterior knee that she states has gotten worse since 10/8.  Hematoma about the size of a golf ball.  Denies any other injury.  Did not hit her head during the fall.  Pain 8/10 sharp.

## 2023-04-25 NOTE — Telephone Encounter (Signed)
Fell on Saturday. She was seen On Tuesday [facility] in [place] and had X-rays. There was a lot of swelling- couple of knots- they said to elevate, heat, rest as much as you can.  Since then- The knots are still there and "overall swelling" has increased and discomfort; and now there is discoloration- purple/black that has wrapped around knee. The black/purple discoloration area goes about an inch past knee.   Due to the increased swelling and the discoloration- Advised patient to go to the ER to be checked out at this time. She verbalized understanding, reports she will call someone to take her to the ER at [facility].  Asked her to give [place] an update tomorrow. She verbalized understanding- given number to call.

## 2023-04-25 NOTE — Discharge Instructions (Signed)
You were seen for your knee swelling in the emergency department.  It is likely from hematoma.  At home, please take Tylenol for the pain.  Use the Ace wrap to help with the swelling.  You may elevate it and heat it as well to help with the discomfort and swelling.    Check your MyChart online for the results of any tests that had not resulted by the time you left the emergency department.   Follow-up with your orthopedic doctor in 1 to 2 weeks regarding your knee pain.  Return immediately to the emergency department if you experience any of the following: Worsening pain, or any other concerning symptoms.    Thank you for visiting our Emergency Department. It was a pleasure taking care of you today.

## 2023-04-25 NOTE — Telephone Encounter (Signed)
Patient states she fell Saturday on the right knee that was replaced.  She stated starting to bother her on Tuesday. She went to radiology and was told to use heat due to blood thinner.  She states that she has had blood clots in the past . She ask if she should have test to see if a blood clot or come to see provider.  Some bruising to calf area. No discoloration or change in temperature to leg. No pain to lower leg or foot.  Please advise

## 2023-04-26 ENCOUNTER — Other Ambulatory Visit (INDEPENDENT_AMBULATORY_CARE_PROVIDER_SITE_OTHER): Payer: Medicare Other

## 2023-04-26 DIAGNOSIS — E1140 Type 2 diabetes mellitus with diabetic neuropathy, unspecified: Secondary | ICD-10-CM

## 2023-04-26 DIAGNOSIS — E785 Hyperlipidemia, unspecified: Secondary | ICD-10-CM | POA: Diagnosis not present

## 2023-04-26 DIAGNOSIS — E1169 Type 2 diabetes mellitus with other specified complication: Secondary | ICD-10-CM

## 2023-04-26 LAB — COMPREHENSIVE METABOLIC PANEL
ALT: 12 U/L (ref 0–35)
AST: 20 U/L (ref 0–37)
Albumin: 3.9 g/dL (ref 3.5–5.2)
Alkaline Phosphatase: 81 U/L (ref 39–117)
BUN: 9 mg/dL (ref 6–23)
CO2: 29 meq/L (ref 19–32)
Calcium: 9.2 mg/dL (ref 8.4–10.5)
Chloride: 105 meq/L (ref 96–112)
Creatinine, Ser: 1.02 mg/dL (ref 0.40–1.20)
GFR: 53.35 mL/min — ABNORMAL LOW (ref >60.00)
Glucose, Bld: 91 mg/dL (ref 70–99)
Potassium: 4.3 meq/L (ref 3.5–5.1)
Sodium: 141 meq/L (ref 135–145)
Total Bilirubin: 1.5 mg/dL — ABNORMAL HIGH (ref 0.2–1.2)
Total Protein: 5.7 g/dL — ABNORMAL LOW (ref 6.0–8.3)

## 2023-04-26 LAB — MICROALBUMIN / CREATININE URINE RATIO
Creatinine,U: 27.6 mg/dL
Microalb Creat Ratio: 2.5 mg/g (ref 0.0–30.0)
Microalb, Ur: <0.7 mg/dL (ref 0.0–1.9)

## 2023-04-26 LAB — LIPID PANEL
Cholesterol: 72 mg/dL (ref 0–200)
HDL: 40.5 mg/dL (ref >39.00)
LDL Cholesterol: 24 mg/dL (ref 0–99)
NonHDL: 31.35
Total CHOL/HDL Ratio: 2
Triglycerides: 38 mg/dL (ref 0.0–149.0)
VLDL: 7.6 mg/dL (ref 0.0–40.0)

## 2023-04-26 LAB — HEMOGLOBIN A1C: Hgb A1c MFr Bld: 5.8 % (ref 4.6–6.5)

## 2023-04-26 NOTE — Progress Notes (Signed)
No critical labs need to be addressed urgently. We will discuss labs in detail at upcoming office visit.   

## 2023-05-03 ENCOUNTER — Ambulatory Visit (INDEPENDENT_AMBULATORY_CARE_PROVIDER_SITE_OTHER): Payer: Medicare Other | Admitting: Family Medicine

## 2023-05-03 ENCOUNTER — Telehealth: Payer: Self-pay

## 2023-05-03 ENCOUNTER — Encounter: Payer: Self-pay | Admitting: Family Medicine

## 2023-05-03 ENCOUNTER — Other Ambulatory Visit (HOSPITAL_COMMUNITY): Payer: Self-pay

## 2023-05-03 VITALS — BP 130/80 | HR 64 | Temp 98.1°F | Ht 63.75 in | Wt 191.1 lb

## 2023-05-03 DIAGNOSIS — E1159 Type 2 diabetes mellitus with other circulatory complications: Secondary | ICD-10-CM | POA: Diagnosis not present

## 2023-05-03 DIAGNOSIS — E66813 Obesity, class 3: Secondary | ICD-10-CM | POA: Diagnosis not present

## 2023-05-03 DIAGNOSIS — J4540 Moderate persistent asthma, uncomplicated: Secondary | ICD-10-CM

## 2023-05-03 DIAGNOSIS — R350 Frequency of micturition: Secondary | ICD-10-CM | POA: Insufficient documentation

## 2023-05-03 DIAGNOSIS — L299 Pruritus, unspecified: Secondary | ICD-10-CM

## 2023-05-03 DIAGNOSIS — E785 Hyperlipidemia, unspecified: Secondary | ICD-10-CM

## 2023-05-03 DIAGNOSIS — F325 Major depressive disorder, single episode, in full remission: Secondary | ICD-10-CM

## 2023-05-03 DIAGNOSIS — Z0000 Encounter for general adult medical examination without abnormal findings: Secondary | ICD-10-CM | POA: Diagnosis not present

## 2023-05-03 DIAGNOSIS — Z6841 Body Mass Index (BMI) 40.0 and over, adult: Secondary | ICD-10-CM

## 2023-05-03 DIAGNOSIS — E1140 Type 2 diabetes mellitus with diabetic neuropathy, unspecified: Secondary | ICD-10-CM | POA: Diagnosis not present

## 2023-05-03 DIAGNOSIS — I272 Pulmonary hypertension, unspecified: Secondary | ICD-10-CM

## 2023-05-03 DIAGNOSIS — E1169 Type 2 diabetes mellitus with other specified complication: Secondary | ICD-10-CM | POA: Diagnosis not present

## 2023-05-03 DIAGNOSIS — S8001XA Contusion of right knee, initial encounter: Secondary | ICD-10-CM | POA: Insufficient documentation

## 2023-05-03 DIAGNOSIS — E0590 Thyrotoxicosis, unspecified without thyrotoxic crisis or storm: Secondary | ICD-10-CM

## 2023-05-03 DIAGNOSIS — I480 Paroxysmal atrial fibrillation: Secondary | ICD-10-CM

## 2023-05-03 DIAGNOSIS — I152 Hypertension secondary to endocrine disorders: Secondary | ICD-10-CM

## 2023-05-03 DIAGNOSIS — I5032 Chronic diastolic (congestive) heart failure: Secondary | ICD-10-CM | POA: Diagnosis not present

## 2023-05-03 DIAGNOSIS — S8001XD Contusion of right knee, subsequent encounter: Secondary | ICD-10-CM | POA: Diagnosis not present

## 2023-05-03 DIAGNOSIS — E1340 Other specified diabetes mellitus with diabetic neuropathy, unspecified: Secondary | ICD-10-CM

## 2023-05-03 DIAGNOSIS — E661 Drug-induced obesity: Secondary | ICD-10-CM

## 2023-05-03 DIAGNOSIS — I484 Atypical atrial flutter: Secondary | ICD-10-CM

## 2023-05-03 LAB — POC URINALSYSI DIPSTICK (AUTOMATED)
Bilirubin, UA: NEGATIVE
Glucose, UA: POSITIVE — AB
Ketones, UA: NEGATIVE
Leukocytes, UA: NEGATIVE
Nitrite, UA: NEGATIVE
Protein, UA: NEGATIVE
Spec Grav, UA: 1.01 (ref 1.010–1.025)
Urobilinogen, UA: 0.2 U/dL
pH, UA: 6 (ref 5.0–8.0)

## 2023-05-03 LAB — HM DIABETES FOOT EXAM

## 2023-05-03 MED ORDER — HYDROXYZINE HCL 10 MG PO TABS: 10.0000 mg | ORAL_TABLET | Freq: Every day | ORAL | 2 refills | Status: AC | PRN

## 2023-05-03 NOTE — Assessment & Plan Note (Signed)
She has chronic issues with overactive bladder and has tried many medications for this per Dr. Celso [facility].  She is currently on Ditropan as well as Detrol LA with continued issues with urinary incontinence.  She does note in the last several weeks she has had increased in urinary frequency and urgency.  No dysuria.  Urinalysis in office today is normal without sign of infection but does have increased amount of glucose.  We discussed improved diabetes control will likely improve urinary frequency as well. If she continues to have issues she will follow-up with urology.

## 2023-05-03 NOTE — Assessment & Plan Note (Signed)
Chronic, euvolemic in office today.  She does have some intermittent swelling in right lower leg following right total knee replacement.

## 2023-05-03 NOTE — Assessment & Plan Note (Signed)
Followed by cardiology. 

## 2023-05-03 NOTE — Telephone Encounter (Signed)
Pharmacy Patient Advocate Encounter  Received notification from [facility] that Prior Authorization for Hydroxyzine tabs has been APPROVED from 02/02/2023 to 05/02/2024   PA #/Case ID/Reference #: [id number]

## 2023-05-03 NOTE — Assessment & Plan Note (Signed)
Chronic followed by endocrinology on methimazole.

## 2023-05-03 NOTE — Progress Notes (Signed)
Patient ID: [name], female    DOB: [birth year], 77 y.o.   MRN: [id number]  This visit was conducted in person.  BP 130/80 (BP Location: Left Arm, Patient Position: Sitting, Cuff Size: Large)   Pulse 64   Temp 98.1 F (36.7 C) (Temporal)   Ht 5' 3.75" (1.619 m)   Wt 191 lb 2 oz (86.7 kg)   SpO2 97%   BMI 33.06 kg/m    CC:  Chief Complaint  Patient presents with   Medicare Wellness    Subjective:   HPI: Alicia BAYHA is a 77 y.o. female presenting on 05/03/2023 for Medicare Wellness  The patient presents for annual medicare wellness, complete physical and review of chronic health problems. He/She also has the following acute concerns today:  Fell on 10/5.Marland Kitchen  Saw Dr. Darrelyn Hillock... Dx with hematoma on right anterior knee... treated with heating pad. Went to ER on 10/10 given continued pain... wrapped knee with  ACE bandage.  Swelling is down and pain  improved.  Hx of OAB on 2 medications ( Dtitropan  AND Detrol).. in last few week more increase in urgency and frequency. No abd pain, no dysuria.    I have personally reviewed the Medicare Annual Wellness questionnaire and have noted 1. The patient's medical and social history 2. Their use of alcohol, tobacco or illicit drugs 3. Their current medications and supplements 4. The patient's functional ability including ADL's, fall risks, home safety risks and hearing or visual             impairment. 5. Diet and physical activities 6. Evidence for depression or mood disorders 7.         Updated provider list Cognitive evaluation was performed and recorded on pt medicare questionnaire form. The patients weight, height, BMI and visual acuity have been recorded in the chart   I have made referrals, counseling and provided education to the patient based review of the above and I have provided the pt with a written personalized care plan for preventive services.   Documentation of this information was scanned into the  electronic record under the media tab.   Advance directives and end of life planning reviewed in detail with patient and documented in EMR. Patient given handout on advance care directives if needed. HCPOA and living will updated if needed.  No falls in last 12 months.  Hearing Screening - Comments:: Have Bilateral Hearing Aides Vision Screening - Comments:: Wears Glasses-Eye Exam with Dr. Elmer Picker 01/03/23    Hypertension:  Blood pressure well controlled on irbesartan 150 mg p.o. daily BP Readings from Last 3 Encounters:  05/03/23 130/80  04/25/23 (!) 170/86  04/19/23 132/78  Using medication without problems or lightheadedness:  Chest pain with exertion: none Edema:  off and on on left chronically since knee replacement. Short of breath: stable Average home BPs: Other issues: Wt Readings from Last 3 Encounters:  05/03/23 191 lb 2 oz (86.7 kg)  04/25/23 201 lb (91.2 kg)  04/19/23 201 lb 1.6 oz (91.2 kg)   Overactive bladder followed by urology Dr. Celso Malea Swilling.  Treated with both oxybutynin and tolterodine. See above  Diabetes: Excellent control with diet Lab Results  Component Value Date   HGBA1C 5.8 04/26/2023  Using medications without difficulties: Hypoglycemic episodes: Hyperglycemic episodes: Feet problems: none Blood Sugars averaging: eye exam within last year: yes  Elevated Cholesterol: Cholesterol well controlled on Lipitor 10 mg p.o. daily Lab Results  Component Value Date   CHOL 72  04/26/2023   HDL 40.50 04/26/2023   LDLCALC 24 04/26/2023   TRIG 38.0 04/26/2023   CHOLHDL 2 04/26/2023  Using medications without problems: Muscle aches:  Diet compliance: Good Exercise: Minimal Other complaints:  Ductal carcinoma in situ left breast treated with left breast lumpectomy by Dr. Luisa Hart. Completed adjuvant radiation and antiestrogen therapy. Reviewed last general surgery office visit and last oncology visit.   Paroxysmal atrial fibrillation, diastolic CHF  followed by Dr. Royann Shivers reviewed last office visit April 25, 2022 On Xarelto anticoagulation.  Asthma moderate persistent followed by allergist and pulmonary.  On Spiriva and Flovent.  Relevant past medical, surgical, family and social history reviewed and updated as indicated. Interim medical history since our last visit reviewed. Allergies and medications reviewed and updated. Outpatient Medications Prior to Visit  Medication Sig Dispense Refill   ADVAIR HFA 230-21 MCG/ACT inhaler Inhale 2 puffs into the lungs 2 (two) times daily. 2 puffs 1-2 times daily depending on disease activity. 36 g 1   albuterol (PROVENTIL) (2.5 MG/3ML) 0.083% nebulizer solution Take 3 mLs (2.5 mg total) by nebulization every 6 (six) hours as needed for wheezing or shortness of breath. 150 mL 1   albuterol (VENTOLIN HFA) 108 (90 Base) MCG/ACT inhaler INHALE 2 PUFFS BY MOUTH EVERY 6 HOURS AS NEEDED FOR WHEEZING OR SHORTNESS OF BREATH 18 g 1   anastrozole (ARIMIDEX) 1 MG tablet TAKE 1 TABLET(1 MG) BY MOUTH DAILY 90 tablet 3   atorvastatin (LIPITOR) 10 MG tablet TAKE 1 TABLET(10 MG) BY MOUTH DAILY 90 tablet 3   Blood Glucose Monitoring Suppl (ONETOUCH VERIO) w/Device KIT Use to check blood sugar up to 2 times a day 1 kit 0   cyclobenzaprine (FLEXERIL) 10 MG tablet Take 0.5-1 tablets (5-10 mg total) by mouth at bedtime as needed for muscle spasms. 20 tablet 0   desloratadine (CLARINEX) 5 MG tablet TAKE 1 TABLET(5 MG) BY MOUTH DAILY 90 tablet 1   EPINEPHrine 0.3 mg/0.3 mL IJ SOAJ injection USE AS DIRECTED FOR LIFE THREATENING ALLERGIC REACTIONS 2 each 1   estradiol (ESTRACE) 0.1 MG/GM vaginal cream Place 1 Applicatorful vaginally 2 (two) times a week. (As Needed)     famotidine (PEPCID) 40 MG tablet Take 1 (ONE) tablet by mouth every evening. 90 tablet 1   fluticasone (FLOVENT HFA) 220 MCG/ACT inhaler Inhale 2 puffs into the lungs 2 (two) times daily. 2 puffs 2 times daily during flare up. 36 g 1   gabapentin (NEURONTIN)  300 MG capsule Take one capsule in the morning, one at lunch and two at bedtime 360 capsule 3   glucose blood (ONETOUCH VERIO) test strip 1 strip 2 (two) times daily     GLYXAMBI 10-5 MG TABS TAKE 1 TABLET BY MOUTH DAILY 30 tablet 5   ipratropium (ATROVENT) 0.06 % nasal spray Place 2 sprays into both nostrils 2 (two) times daily as needed for rhinitis. 45 mL 1   irbesartan (AVAPRO) 150 MG tablet TAKE 1 TABLET(150 MG) BY MOUTH DAILY 90 tablet 2   Lactase (DAIRY-RELIEF PO) Take 1 capsule by mouth daily as needed (eating dairy).     Lancets (ONETOUCH DELICA PLUS LANCET33G) MISC USE TO CHECK BLOOD SUGAR UP TO TWICE DAILY 100 each 5   levalbuterol (XOPENEX) 1.25 MG/3ML nebulizer solution USE 1 VIAL VIA NEBULIZER EVERY 6 HOURS AS NEEDED FOR SHORTNESS OF BREATH OR WHEEZING 90 mL 3   Magnesium 500 MG TABS Take 500 mg by mouth 2 (two) times daily.     Menthol,  Topical Analgesic, (ABSORBINE PLUS JR EX) Apply 1 patch topically daily as needed (pain).     Menthol-Methyl Salicylate (SALONPAS JET SPRAY EX) Apply 1 spray topically daily as needed (knee pain).     montelukast (SINGULAIR) 10 MG tablet Take 1 tablet (10 mg total) by mouth at bedtime. For asthma control. 90 tablet 1   Mouthwashes (BIOTENE DRY MOUTH MT) Use as directed 1 Dose in the mouth or throat daily as needed (dry mouth).     Nebulizer MISC 1 Device by Does not apply route as directed. 1 each 1   omeprazole (PRILOSEC) 40 MG capsule Take 40 mg by mouth every morning.     ondansetron (ZOFRAN-ODT) 4 MG disintegrating tablet Take 1 tablet (4 mg total) by mouth every 8 (eight) hours as needed for nausea or vomiting. 20 tablet 0   oxybutynin (DITROPAN-XL) 10 MG 24 hr tablet Take 10 mg by mouth daily.     Plecanatide (TRULANCE) 3 MG TABS Take 3 mg by mouth daily as needed (constipation).     Polyethyl Glycol-Propyl Glycol (SYSTANE OP) Place 1 drop into both eyes daily as needed (dry eyes).     polyethylene glycol powder (GLYCOLAX/MIRALAX) 17 GM/SCOOP  powder Take 1 Container by mouth once.     PRESCRIPTION MEDICATION Inhale into the lungs at bedtime. CPAP     Respiratory Therapy Supplies (NEBULIZER MASK ADULT) MISC 1 kit by Does not apply route as directed. 1 each 1   rivaroxaban (XARELTO) 20 MG TABS tablet Take 1 tablet (20 mg total) by mouth daily with supper. 90 tablet 1   tolterodine (DETROL LA) 4 MG 24 hr capsule Take 4 mg by mouth at bedtime.     triamcinolone (NASACORT) 55 MCG/ACT AERO nasal inhaler Place 2 sprays into the nose daily. 50.7 mL 1   hydrOXYzine (ATARAX/VISTARIL) 10 MG tablet Take 1 tablet (10 mg total) by mouth daily as needed for itching. 30 tablet 2   Tezepelumab-ekko (TEZSPIRE [place]) Inject into the skin every 30 (thirty) days.     Facility-Administered Medications Prior to Visit  Medication Dose Route Frequency Provider Last Rate Last Admin   tezepelumab-ekko (TEZSPIRE) 210 MG/1. syringe 210 mg  210 mg Subcutaneous Q28 days Jessica Priest, MD   210 mg at 04/09/23 1318     Per HPI unless specifically indicated in ROS section below Review of Systems  Constitutional:  Negative for fatigue and fever.  HENT:  Negative for congestion.   Eyes:  Negative for pain.  Respiratory:  Negative for cough and shortness of breath.   Cardiovascular:  Negative for chest pain, palpitations and leg swelling.  Gastrointestinal:  Negative for abdominal pain.  Genitourinary:  Negative for dysuria and vaginal bleeding.  Musculoskeletal:  Negative for back pain.  Neurological:  Negative for syncope, light-headedness and headaches.  Psychiatric/Behavioral:  Negative for dysphoric mood.    Objective:  BP 130/80 (BP Location: Left Arm, Patient Position: Sitting, Cuff Size: Large)   Pulse 64   Temp 98.1 F (36.7 C) (Temporal)   Ht 5' 3.75" (1.619 m)   Wt 191 lb 2 oz (86.7 kg)   SpO2 97%   BMI 33.06 kg/m   Wt Readings from Last 3 Encounters:  05/03/23 191 lb 2 oz (86.7 kg)  04/25/23 201 lb (91.2 kg)  04/19/23 201 lb 1.6 oz (91.2  kg)      Physical Exam Constitutional:      General: She is not in acute distress.    Appearance: Normal appearance.  She is well-developed. She is not ill-appearing or toxic-appearing.  HENT:     Head: Normocephalic.     Right Ear: Hearing, tympanic membrane, ear canal and external ear normal. Tympanic membrane is not erythematous, retracted or bulging.     Left Ear: Hearing, tympanic membrane, ear canal and external ear normal. Tympanic membrane is not erythematous, retracted or bulging.     Nose: No mucosal edema or rhinorrhea.     Right Sinus: No maxillary sinus tenderness or frontal sinus tenderness.     Left Sinus: No maxillary sinus tenderness or frontal sinus tenderness.     Mouth/Throat:     Pharynx: Uvula midline.  Eyes:     General: Lids are normal. Lids are everted, no foreign bodies appreciated.     Conjunctiva/sclera: Conjunctivae normal.     Pupils: Pupils are equal, round, and reactive to light.  Neck:     Thyroid: No thyroid mass or thyromegaly.     Vascular: No carotid bruit.     Trachea: Trachea normal.  Cardiovascular:     Rate and Rhythm: Normal rate and regular rhythm.     Pulses: Normal pulses.     Heart sounds: Normal heart sounds, S1 normal and S2 normal. No murmur heard.    No friction rub. No gallop.  Pulmonary:     Effort: Pulmonary effort is normal. No tachypnea or respiratory distress.     Breath sounds: Normal breath sounds. No decreased breath sounds, wheezing, rhonchi or rales.  Abdominal:     General: Bowel sounds are normal.     Palpations: Abdomen is soft.     Tenderness: There is no abdominal tenderness.  Musculoskeletal:     Cervical back: Normal range of motion and neck supple.  Skin:    General: Skin is warm and dry.     Findings: No rash.  Neurological:     Mental Status: She is alert.  Psychiatric:        Mood and Affect: Mood is not anxious or depressed.        Speech: Speech normal.        Behavior: Behavior normal. Behavior is  cooperative.        Thought Content: Thought content normal.        Judgment: Judgment normal.   Diabetic foot exam: bunions and arthritis changes of joint. No skin breakdown Has prediabetic  calluses bilateral feet Normal DP pulses NO sensation to light touch and monofilament Nails abnormal     Results for orders placed or performed in visit on 05/03/23  HM DIABETES FOOT EXAM  Result Value Ref Range   HM Diabetic Foot Exam done    *Note: Due to a large number of results and/or encounters for the requested time period, some results have not been displayed. A complete set of results can be found in Results Review.     COVID 19 screen:  No recent travel or known exposure to COVID19 The patient denies respiratory symptoms of COVID 19 at this time. The importance of social distancing was discussed today.   Assessment and Plan The patient's preventative maintenance and recommended screening tests for an annual wellness exam were reviewed in full today. Brought up to date unless services declined.  Counselled on the importance of diet, exercise, and its role in overall health and mortality. The patient's FH and SH was reviewed, including their home life, tobacco status, and drug and alcohol status.   Mammogram: history of breast cancer,  02/2022... scheduled  for repeat 2024 DEXA 06/2022 normal.  PAP not indicated Hep C neg  Vaccines: uptodate with tdap, PNA, flu uptodate, and COVID x 5, she says she has had shingles series.  Colonoscopy 09/2022, repeat in 5 years   Problem List Items Addressed This Visit     Atypical atrial flutter (HCC)    Followed by cardiology      Chronic diastolic heart failure (HCC)    Chronic, euvolemic in office today.  She does have some intermittent swelling in right lower leg following right total knee replacement.      Class 3 drug-induced obesity with serious comorbidity and body mass index (BMI) of 40.0 to 44.9 in adult [facility]) (Chronic)     Encouraged exercise, weight loss, healthy eating habits.       Depression, major, in remission (HCC)    Chronic, now well controlled and in  remission on no medication      Relevant Medications   hydrOXYzine (ATARAX) 10 MG tablet   Hyperlipidemia associated with type 2 diabetes mellitus (HCC) (Chronic)    Stable, chronic.  Continue current medication.  Lipitor 10 mg p.o. daily      Hypertension associated with diabetes (HCC)    Chronic, well controlled on irbesartan 150 mg daily      Hyperthyroidism    Chronic followed by endocrinology on methimazole.      Moderate persistent asthma (Chronic)    Chronic, stable control on Spiriva and Flovent      Neuropathy due to secondary diabetes (HCC)    Chronic, secondary to diabetes minimally problematic for patient currently.      Paroxysmal atrial fibrillation (HCC)    On Xarelto anticoagulation, rate controlled.  Followed by cardiology      Pulmonary hypertension (HCC)    Chronic, followed by cardiology      Traumatic hematoma of right knee    Acute, healing.  Continue compression and heat.      Type 2 diabetes mellitus with diabetic neuropathy, unspecified (HCC)    Chronic, well controlled with diet      Other Visit Diagnoses     Medicare annual wellness visit, subsequent    -  Primary       Patient Care Team: Excell Seltzer, MD as PCP - General (Family Medicine) Croitoru, Rachelle Hora, MD as PCP - Cardiology (Cardiology) Dimitri Ped, MD as Consulting Physician (Ophthalmology) Lucie Leather, Alvira Philips, MD as Consulting Physician (Allergy and Immunology) Darci Needle, MD as Consulting Physician (Endocrinology) Merwyn Katos, DPM as Consulting Physician (Podiatry) Cherlyn Roberts, MD as Consulting Physician (Dermatology) Aletha Halim, MD as Referring Physician (Specialist) Kin, Elon Jester, DC as Referring Physician (Chiropractic Medicine) Bernette Redbird, MD as Consulting Physician (Gastroenterology) Hallows,  Walker Kehr, MD (Orthopedic Surgery) Dohmeier, Porfirio Mylar, MD as Consulting Physician (Neurology) Harriette Bouillon, MD as Consulting Physician (General Surgery) Antony Blackbird, MD as Consulting Physician (Radiation Oncology) Kathyrn Sheriff, [facility] (Inactive) as Pharmacist (Pharmacist) Hurley Cisco, Clent Jacks, [place] as Social Worker Serena Croissant, MD as Consulting Physician (Hematology and Oncology)   Kerby Nora, MD

## 2023-05-03 NOTE — Patient Instructions (Signed)
Work on decreasing sugar in diet as it may help with urine frequency.  Can use ALA 600 mg daily for neuropathy.

## 2023-05-03 NOTE — Assessment & Plan Note (Signed)
Chronic, now well controlled and in  remission on no medication

## 2023-05-03 NOTE — Assessment & Plan Note (Signed)
On Xarelto anticoagulation, rate controlled.  Followed by cardiology

## 2023-05-03 NOTE — Assessment & Plan Note (Signed)
Chronic, intermittent.  She has used Atarax in the past.  I did discuss the sedating side effect of this medication along with her other anticholinergics.  She will limit its use.  We also discussed the lotion she is using contains alcohol which may be over drying her skin.  She will change to hydrating moisturizing cream or use oil.

## 2023-05-03 NOTE — Assessment & Plan Note (Signed)
Chronic, followed by cardiology

## 2023-05-03 NOTE — Assessment & Plan Note (Addendum)
Acute, healing.  Continue compression and heat.

## 2023-05-03 NOTE — Assessment & Plan Note (Addendum)
Chronic, well controlled with diet but somewhat worse than in the past.  Potentially contributing to urinary frequency as well as neuropathy. She will check blood sugars at home and we reviewed normal expected fasting blood sugar level between 80 and 120. She will work on low sugar low carbohydrate diet.

## 2023-05-03 NOTE — Assessment & Plan Note (Signed)
Encouraged exercise, weight loss, healthy eating habits. ? ?

## 2023-05-03 NOTE — Assessment & Plan Note (Signed)
Stable, chronic.  Continue current medication.  Lipitor 10 mg p.o. daily

## 2023-05-03 NOTE — Assessment & Plan Note (Signed)
Chronic, well controlled on irbesartan 150 mg daily

## 2023-05-03 NOTE — Assessment & Plan Note (Signed)
Chronic, stable control on Spiriva and Flovent

## 2023-05-03 NOTE — Assessment & Plan Note (Addendum)
Chronic, secondary to diabetes  Some of foot symptoms of feeling like a bristle on the bottom of left foot may be due to neuropathy.  We discussed starting AALA 600 mg p.o. daily for her symptoms.

## 2023-05-06 DIAGNOSIS — M25561 Pain in right knee: Secondary | ICD-10-CM | POA: Diagnosis not present

## 2023-05-07 ENCOUNTER — Ambulatory Visit: Payer: Medicare Other | Admitting: *Deleted

## 2023-05-07 ENCOUNTER — Encounter: Payer: Self-pay | Admitting: Internal Medicine

## 2023-05-07 ENCOUNTER — Ambulatory Visit (INDEPENDENT_AMBULATORY_CARE_PROVIDER_SITE_OTHER): Payer: Medicare Other | Admitting: Internal Medicine

## 2023-05-07 VITALS — BP 140/70 | HR 61 | Ht 63.75 in | Wt 193.2 lb

## 2023-05-07 DIAGNOSIS — E0500 Thyrotoxicosis with diffuse goiter without thyrotoxic crisis or storm: Secondary | ICD-10-CM | POA: Diagnosis not present

## 2023-05-07 DIAGNOSIS — E041 Nontoxic single thyroid nodule: Secondary | ICD-10-CM

## 2023-05-07 DIAGNOSIS — J4550 Severe persistent asthma, uncomplicated: Secondary | ICD-10-CM

## 2023-05-07 LAB — T4, FREE: Free T4: 1.11 ng/dL (ref 0.60–1.60)

## 2023-05-07 LAB — T3, FREE: T3, Free: 2.8 pg/mL (ref 2.3–4.2)

## 2023-05-07 LAB — TSH: TSH: 0.58 u[IU]/mL (ref 0.35–5.50)

## 2023-05-07 NOTE — Progress Notes (Signed)
Patient ID: [name], female   DOB: [birth year], 77 y.o.   MRN: [id number]   HPI  [name] is a 77 y.o.-year-old female, returning for f/u for for thyrotoxicosis and thyroid nodule.  She previously saw another endocrinologist (Dr. Juleen China), but only for her diabetes. She is the sister of Burnett Kanaris, also my pt. Last OV with me 1 year ago.  Interim history: She denies tremors, palpitations, heat intolerance. She had some weight loss, but not unintentional (2 lbs per our scale). Before last visit, she was diagnosed with breast cancer. She had breast surgery in 03/2022.  She then had radiation therapy - finished in 05/2022. No ChTx needed. She fell 04/20/2023 >> hurt her knee.  She had swelling, now improving.  No steroids. Takes Tylenol for pain. She saw Dr. Darrelyn Hillock yesterday.  Reviewed and addended history: Patient was found to be thyrotoxic in 08/2015 but she reports that she was only informed about this in 2018.  Thyroid scan indicated possible Graves' disease and also a cold left thyroid nodule.    At that time, she was recommended surgery but she did not want to proceed with this and started to see me.    She was started on methimazole initially 10 mg daily.  Doses were changed afterwards:  Most recently: In 09/2018 we decreased the dose of methimazole to 2.5 mg daily.   In 04/2020, we stopped methimazole In 05/2020, we restarted methimazole 5 mg daily (she misunderstood instructions to only start 2.5 mg daily) In 10/2020, we decreased the methimazole to 2.5 mg daily In 12/2020, we decreased methimazole to 2.5 mg every other day In 01/2021, we stopped methimazole In 05/2021, we restarted methimazole 5 mg daily In 04/2022, we reduced methimazole to 2.5 mg daily In 01/2023, we reduced methimazole to 5 mg twice a week In 03/2023, we stopped methimazole  Her left thyroid nodule was biopsied with benign results in 2019.    Reviewed her TFTs: Lab Results  Component Value  Date   TSH 3.45 03/19/2023   TSH 3.83 01/29/2023   TSH 2.96 06/20/2022   TSH 4.87 05/01/2022   TSH 0.64 10/31/2021   TSH 0.71 06/27/2021   TSH 0.13 (L) 05/30/2021   TSH 0.25 (L) 05/02/2021   TSH 1.23 01/18/2021   TSH 4.07 12/15/2020   FREET4 1.02 03/19/2023   FREET4 1.19 01/29/2023   FREET4 0.77 06/20/2022   FREET4 0.75 05/01/2022   FREET4 1.39 10/31/2021   FREET4 0.99 06/27/2021   FREET4 1.49 05/30/2021   FREET4 1.09 05/02/2021   FREET4 1.03 01/18/2021   FREET4 1.11 12/15/2020   T3FREE 3.3 03/19/2023   T3FREE 3.2 01/29/2023   T3FREE 2.8 06/20/2022   T3FREE 2.6 05/01/2022   T3FREE 2.7 10/31/2021   T3FREE 3.3 06/27/2021   T3FREE 3.0 05/30/2021   T3FREE 2.6 05/02/2021   T3FREE 3.0 01/18/2021   T3FREE 2.6 12/15/2020  09/29/2021: TSH 5.81 (0.34-5.66)  Thyroid uptake and scan (10/31/2017): Increased uptake of 40%, and cold left inferior pole nodule.  Thyroid U/S (11/12/2017): 3.2 cm left inferior TR 3 nodule correlates with the cold nodule by nuclear medicine scan. This nodule meets criteria for biopsy as above.  FNA of her left thyroid nodule (12/04/2017): Benign  Thyroid U/S (10/23/2019): Slightly enlarged gland but not due to enlargement of the thyroid nodule. It is possible that this is due to more inflammation. Parenchymal Echotexture: Mildly heterogenous Isthmus: Normal in size measuring 0.6 cm in diameter, unchanged Right lobe: Normal in size  measuring 5.3 x 1.4 x 1.6 cm, previously, 4.1 x 1.2 x 1.5 cm Left lobe: Enlarged measuring 6.9 x 3.9 x 2.4 cm, previously, 6.1 x 2.1 x 2.3 cm. ___________________________________________________________   The previously biopsied approximately 3.0 x 2.7 x 1.9 cm isoechoic ill-defined nodule within the mid/inferior aspect of the left lobe of the thyroid is grossly unchanged in size compared to the 10/2017 examination, previously, 3.2 x 2.5 x 2.0 cm. Correlation with previous biopsy results is advised.   IMPRESSION: 1. Similar  appearing mildly heterogeneous and borderline enlarged thyroid gland without worrisome new or enlarging thyroid nodule. 2. Previously biopsied nodule within the mid/inferior aspect the left lobe of the thyroid is grossly unchanged compared to the 10/2017 examination. Correlation with previous biopsy results is advised. Assuming a benign pathologic diagnosis, repeat sampling and/or continued dedicated follow-up is not recommend  Ba swallow ((02/22/2023): 1. Mild-to-moderate esophageal dysmotility. 2. No evidence of esophageal mass, stricture or ulceration. No reflux elicited. 3. Laryngeal penetration without aspiration.  Her Graves' antibodies were elevated: Lab Results  Component Value Date   TSI 485 (H) 05/30/2021   TSI 376 (H) 04/26/2020   TSI 331 (H) 11/03/2019   TSI 395 (H) 04/06/2019   TSI 361 (H) 02/06/2018   Pt denies: - feeling nodules in neck - hoarseness - dysphagia Rarely choking, with drier foods. She has to chew the food well.  No FH of thyroid disease or cancer. No h/o radiation tx to head or neck. No current biotin use. No recent steroids use.    She also has a history of type 2 diabetes - well controlled - managed by PCP: Lab Results  Component Value Date   HGBA1C 5.8 04/26/2023   She has asthma, on 2 inhalers. Sees Dr. Lucie Leather. She had cardioversion on 11/23/2020 - A. Fib with SOB.  She fell in 07/2021 >> concussion.  She had a R TKR in 10/2021. She had cholecystectomy in 12/2021.  ROS:  + See HPI  I reviewed pt's medications, allergies, PMH, social hx, family hx, and changes were documented in the history of present illness. Otherwise, unchanged from my initial visit note.  Past Medical History:  Diagnosis Date   Allergic rhinitis    Allergy    Arthritis    Asthma    Breast cancer (HCC)    Chronic headache    Diabetes mellitus    Ductal carcinoma in situ (DCIS) of left breast 02/23/2022   DVT (deep venous thrombosis) (HCC)    Dyspnea    Heart  murmur    History of radiation therapy    Left Breast 05/02/22-05/30/22- Dr. Antony Blackbird   Hypertension    Penicillin allergy 01/19/2020   Pulmonary embolism (HCC)    Pulmonary hypertension (HCC)    Seizures (HCC)    Past Surgical History:  Procedure Laterality Date   ABDOMINAL HYSTERECTOMY     partial, has ovaries   BREAST LUMPECTOMY WITH RADIOACTIVE SEED LOCALIZATION Left 03/29/2022   Procedure: LEFT BREAST LUMPECTOMY WITH RADIOACTIVE SEED LOCALIZATION;  Surgeon: Harriette Bouillon, MD;  Location: [facility] OR;  Service: General;  Laterality: Left;   CARDIAC CATHETERIZATION  12/28/2010   Mod. pulmonary hypertension, normal coronary arteries   CARDIOVERSION N/A 11/23/2020   Procedure: CARDIOVERSION;  Surgeon: Thurmon Fair, MD;  Location: [facility];  Service: Cardiovascular;  Laterality: N/A;   CHOLECYSTECTOMY N/A 12/20/2021   Procedure: LAPAROSCOPIC CHOLECYSTECTOMY WITH INTRAOPERATIVE CHOLANGIOGRAM;  Surgeon: Darnell Level, MD;  Location: [facility] ORS;  Service: General;  Laterality: N/A;  DOPPLER ECHOCARDIOGRAPHY  10/08/2011   EF=>55%,mild asymmetric LVH, mod. TR, mod. PH, mild to mod LA dilatation   IR LUMBAR DISC ASPIRATION W/IMG GUIDE  01/12/2020   KNEE ARTHROSCOPY Left    KNEE SURGERY     Nuclear Stress Test  05/20/2006   No ischemia   PARTIAL HYSTERECTOMY     PLANTAR FASCIA SURGERY     TONSILLECTOMY     Social History   Socioeconomic History   Marital status: Widowed    Spouse name: Not on file   Number of children: Y   Years of education: Not on file   Highest education level: Not on file  Occupational History   Occupation: retired Programme researcher, broadcasting/film/video.   Tobacco Use   Smoking status: Never   Smokeless tobacco: Never  Vaping Use   Vaping status: Never Used  Substance and Sexual Activity   Alcohol use: Not Currently    Alcohol/week: 0.0 standard drinks of alcohol    Comment: occ glass on wine   Drug use: No   Sexual activity: Never  Other Topics Concern   Not on file  Social  History Narrative   Widow    limited exercise.   Social Determinants of Health   Financial Resource Strain: Low Risk  (09/12/2022)   Overall Financial Resource Strain (CARDIA)    Difficulty of Paying Living Expenses: Not hard at all  Food Insecurity: No Food Insecurity (09/12/2022)   Hunger Vital Sign    Worried About Running Out of Food in the Last Year: Never true    Ran Out of Food in the Last Year: Never true  Transportation Needs: No Transportation Needs (09/12/2022)   PRAPARE - Administrator, Civil Service (Medical): No    Lack of Transportation (Non-Medical): No  Physical Activity: Not on file  Stress: Not on file  Social Connections: Unknown (11/27/2021)   [facility]   Social Network    Social Network: Not on file  Intimate Partner Violence: Unknown (10/19/2021)   Received from [facility]   HITS    Physically Hurt: Not on file    Insult or Talk Down To: Not on file    Threaten Physical Harm: Not on file    Scream or Curse: Not on file   Current Outpatient Medications on File Prior to Visit  Medication Sig Dispense Refill   ADVAIR HFA 230-21 MCG/ACT inhaler Inhale 2 puffs into the lungs 2 (two) times daily. 2 puffs 1-2 times daily depending on disease activity. 36 g 1   albuterol (PROVENTIL) (2.5 MG/3ML) 0.083% nebulizer solution Take 3 mLs (2.5 mg total) by nebulization every 6 (six) hours as needed for wheezing or shortness of breath. 150 mL 1   albuterol (VENTOLIN HFA) 108 (90 Base) MCG/ACT inhaler INHALE 2 PUFFS BY MOUTH EVERY 6 HOURS AS NEEDED FOR WHEEZING OR SHORTNESS OF BREATH 18 g 1   anastrozole (ARIMIDEX) 1 MG tablet TAKE 1 TABLET(1 MG) BY MOUTH DAILY 90 tablet 3   atorvastatin (LIPITOR) 10 MG tablet TAKE 1 TABLET(10 MG) BY MOUTH DAILY 90 tablet 3   Blood Glucose Monitoring Suppl (ONETOUCH VERIO) w/Device KIT Use to check blood sugar up to 2 times a day 1 kit 0   cyclobenzaprine (FLEXERIL) 10 MG tablet  Take 0.5-1 tablets (5-10 mg total) by mouth at bedtime as needed for muscle spasms. 20 tablet 0   desloratadine (CLARINEX) 5 MG tablet TAKE 1 TABLET(5 MG) BY MOUTH DAILY 90  tablet 1   EPINEPHrine 0.3 mg/0.3 mL IJ SOAJ injection USE AS DIRECTED FOR LIFE THREATENING ALLERGIC REACTIONS 2 each 1   estradiol (ESTRACE) 0.1 MG/GM vaginal cream Place 1 Applicatorful vaginally 2 (two) times a week. (As Needed)     famotidine (PEPCID) 40 MG tablet Take 1 (ONE) tablet by mouth every evening. 90 tablet 1   fluticasone (FLOVENT HFA) 220 MCG/ACT inhaler Inhale 2 puffs into the lungs 2 (two) times daily. 2 puffs 2 times daily during flare up. 36 g 1   gabapentin (NEURONTIN) 300 MG capsule Take one capsule in the morning, one at lunch and two at bedtime 360 capsule 3   glucose blood (ONETOUCH VERIO) test strip 1 strip 2 (two) times daily     GLYXAMBI 10-5 MG TABS TAKE 1 TABLET BY MOUTH DAILY 30 tablet 5   hydrOXYzine (ATARAX) 10 MG tablet Take 1 tablet (10 mg total) by mouth daily as needed for itching. 30 tablet 2   ipratropium (ATROVENT) 0.06 % nasal spray Place 2 sprays into both nostrils 2 (two) times daily as needed for rhinitis. 45 mL 1   irbesartan (AVAPRO) 150 MG tablet TAKE 1 TABLET(150 MG) BY MOUTH DAILY 90 tablet 2   Lactase (DAIRY-RELIEF PO) Take 1 capsule by mouth daily as needed (eating dairy).     Lancets (ONETOUCH DELICA PLUS LANCET33G) MISC USE TO CHECK BLOOD SUGAR UP TO TWICE DAILY 100 each 5   levalbuterol (XOPENEX) 1.25 MG/3ML nebulizer solution USE 1 VIAL VIA NEBULIZER EVERY 6 HOURS AS NEEDED FOR SHORTNESS OF BREATH OR WHEEZING 90 mL 3   Magnesium 500 MG TABS Take 500 mg by mouth 2 (two) times daily.     Menthol, Topical Analgesic, (ABSORBINE PLUS JR EX) Apply 1 patch topically daily as needed (pain).     Menthol-Methyl Salicylate (SALONPAS JET SPRAY EX) Apply 1 spray topically daily as needed (knee pain).     montelukast (SINGULAIR) 10 MG tablet Take 1 tablet (10 mg total) by mouth at  bedtime. For asthma control. 90 tablet 1   Mouthwashes (BIOTENE DRY MOUTH MT) Use as directed 1 Dose in the mouth or throat daily as needed (dry mouth).     Nebulizer MISC 1 Device by Does not apply route as directed. 1 each 1   omeprazole (PRILOSEC) 40 MG capsule Take 40 mg by mouth every morning.     ondansetron (ZOFRAN-ODT) 4 MG disintegrating tablet Take 1 tablet (4 mg total) by mouth every 8 (eight) hours as needed for nausea or vomiting. 20 tablet 0   oxybutynin (DITROPAN-XL) 10 MG 24 hr tablet Take 10 mg by mouth daily.     Plecanatide (TRULANCE) 3 MG TABS Take 3 mg by mouth daily as needed (constipation).     Polyethyl Glycol-Propyl Glycol (SYSTANE OP) Place 1 drop into both eyes daily as needed (dry eyes).     polyethylene glycol powder (GLYCOLAX/MIRALAX) 17 GM/SCOOP powder Take 1 Container by mouth once.     PRESCRIPTION MEDICATION Inhale into the lungs at bedtime. CPAP     Respiratory Therapy Supplies (NEBULIZER MASK ADULT) MISC 1 kit by Does not apply route as directed. 1 each 1   rivaroxaban (XARELTO) 20 MG TABS tablet Take 1 tablet (20 mg total) by mouth daily with supper. 90 tablet 1   tolterodine (DETROL LA) 4 MG 24 hr capsule Take 4 mg by mouth at bedtime.     triamcinolone (NASACORT) 55 MCG/ACT AERO nasal inhaler Place 2 sprays into the nose daily.  50.7 mL 1   Current Facility-Administered Medications on File Prior to Visit  Medication Dose Route Frequency Provider Last Rate Last Admin   tezepelumab-ekko (TEZSPIRE) 210 MG/1. syringe 210 mg  210 mg Subcutaneous Q28 days Jessica Priest, MD   210 mg at 05/07/23 [id number]   Allergies  Allergen Reactions   Dilantin [Phenytoin] Swelling    facial swelling   Latex Hives   Oxycodone Nausea And Vomiting and Nausea Only    Abdominal Pain, Vomiting   Penicillins Nausea And Vomiting and Swelling    Has patient had a PCN reaction causing immediate rash, facial/tongue/throat swelling, SOB or lightheadedness with hypotension patient had  a PCN reaction causing severe rash involving mucus membranes or skin necrosis: [id number] Has patient had a PCN reaction that required hospitalization/No Has patient had a PCN reaction occurring within the last 10 years: No If all of the above answers are "NO", then may proceed with Cephalosporin use.      Codeine Nausea Only    Tolerates tylenol with codeine   Hydrocodone Nausea And Vomiting   Nickel Hives and Rash   Pregabalin Itching and Other (See Comments)    headaches/problem w/vision   Family History  Problem Relation Age of Onset   Cancer Mother        breast and colon cancer   Hypertension Mother    Clotting disorder Mother    Breast cancer Mother 62   Arthritis Mother    Stroke Mother    Diabetes Mother    Colon cancer Mother 29 - 41   Alcohol abuse Father    Cancer Sister        breast cancer   Multiple sclerosis Sister    Cancer Sister        colon cancer   Cancer Brother        colon cancer   Prostate cancer Half-Brother    Stomach cancer Half-Brother    Breast cancer Half-Sister 33       recurrence at 55, reports positive genetic testing   Arthritis Half-Sister    Diabetes Half-Sister    Colon cancer Half-Sister 42   Allergies Other        grandson   Allergic rhinitis Neg Hx    Angioedema Neg Hx    Asthma Neg Hx    Atopy Neg Hx    Eczema Neg Hx    Immunodeficiency Neg Hx    Urticaria Neg Hx    PE: BP (!) 140/70 (BP Location: Right Arm, Patient Position: Sitting, Cuff Size: Normal)   Pulse 61   Ht 5' 3.75" (1.619 m)   Wt 193 lb 3.2 oz (87.6 kg)   SpO2 97%   BMI 33.42 kg/m  Wt Readings from Last 10 Encounters:  05/07/23 193 lb 3.2 oz (87.6 kg)  05/03/23 191 lb 2 oz (86.7 kg)  04/25/23 201 lb (91.2 kg)  04/19/23 201 lb 1.6 oz (91.2 kg)  04/18/23 194 lb 9.6 oz (88.3 kg)  03/12/23 203 lb 9.6 oz (92.4 kg)  02/18/23 198 lb (89.8 kg)  02/12/23 204 lb (92.5 kg)  02/08/23 201 lb 6 oz (91.3 kg)  02/07/23 204 lb 2 oz (92.6 kg)    Constitutional: overweight, in NAD, walks with a cane Eyes:  EOMI, no exophthalmos ENT: no neck masses, no cervical lymphadenopathy Cardiovascular: RRR, No MRG Respiratory: CTA B Musculoskeletal: no deformities Skin:no rashes Neurological: no tremor with outstretched hands  ASSESSMENT: 1.  Graves' disease  2.  Left thyroid nodule  PLAN:  1. Patient with history of thyrotoxicosis consistent with Graves' disease.  She initially had fatigue, heat intolerance, insomnia, but no palpitations, anxiety, hyperdefecation.  The thyroid uptake and scan confirms Graves' disease.  TSI antibodies were elevated.  She was started on methimazole and we were able to titrate the dose down.  In 04/2020, we tried to stop methimazole we had to restart this a month later.  In 01/2021, we again tried to stop the methimazole but we had to restart it in 05/2021.  At last visit she was on 5 mg of methimazole daily and the TSH was close to the upper limit of the target range, at 4.87 so I advised her to reduce the dose to 5 mg every other day.  In 01/2023, we reduced the dose to 5 mg only twice a week and we ended up stopping methimazole completely 03/2023. -At today's visit, she has no thyrotoxic signs or symptoms.  She denies tremors, palpitations, anxiety, heat intolerance.  Before last visit she gained 10 pounds from the previous visit, but she previously lost 20 pounds.  She  lost 2 pounds since then. -We will recheck her TFTs today and see if we need to restart methimazole.  We need to do a very slow taper if her TSH trends up - I will see her back in 1 year, but with labs at 6 months  2.  Left thyroid nodule -This appears to be cold on the thyroid scan -Of note, this nodule was biopsied in 11/2017 and the results were benign -Reviewed the results of her latest ultrasound from 2021: Nodule was stable in size and no imaging follow-up was needed -At last visit, she describes occasional choking when food got stuck  in her throat but this was not new.  She usually needs to chew her food very well.  No new symptoms.  She had a barium swallow 2 months ago which showed mild to moderate esophageal dysmotility but no esophageal mass, stricture, or ulceration.  She will have further testing for this reportedly. -Will continue to monitor her clinically   Component     Latest Ref Rng 05/07/2023  TSH     0.35 - 5.50 uIU/mL 0.58   T4,Free(Direct)     0.60 - 1.60 ng/dL 1.61   Triiodothyronine,Free,Serum     2.3 - 4.2 pg/mL 2.8   TSI     <140 % baseline 377 (H)   TFTs are normal.  For now, we can continue without methimazole, but her TSH decreased significantly and TSI's are still elevated, so I would like to have her back for rechecking her thyroid tests in 1.5 months.  Carlus Pavlov, MD PhD [facility]

## 2023-05-07 NOTE — Patient Instructions (Signed)
For now, continue off Methimazole.  Please stop at the lab.  Please come back for a follow-up appointment in 1 year but for labs in 6 months.

## 2023-05-10 ENCOUNTER — Telehealth: Payer: Self-pay | Admitting: Cardiovascular Disease

## 2023-05-10 LAB — THYROID STIMULATING IMMUNOGLOBULIN: TSI: 377 %{baseline} — ABNORMAL HIGH (ref <140)

## 2023-05-10 NOTE — Telephone Encounter (Signed)
Pt reports swelling in leg is going down. Leg has bruises in a few other spots but no redness, no warmth. Pt concerned due to this being the same leg that had a clot before and "got her started on the xarelto".   Pt has followed up with ortho doctor regarding bone density test.   Reviewed instructions on going to ER or calling 911 if sensation in leg changes, new or worsening swelling in leg or new redness/ warmth. Patient verbalized understanding. No further questions at this time.

## 2023-05-10 NOTE — Telephone Encounter (Signed)
Patient stated she fell and now has a big knot on her knee which is filled with blood.  Patient wants to know next steps as she is on a blood thinner.  Patient also wants to know when next she should be scheduled for a bone density test.

## 2023-05-13 NOTE — Telephone Encounter (Signed)
It sounds like she has a hematoma, not a clot inside the vein.  This is not dangerous and will not cause a pulmonary embolism.  Please let us know if it is enlarging, in which case we may need to temporarily stop the blood thinner, but otherwise just try to keep the leg elevated to help with the swelling go down.

## 2023-05-16 ENCOUNTER — Encounter: Payer: Self-pay | Admitting: Family Medicine

## 2023-05-16 ENCOUNTER — Ambulatory Visit: Payer: Medicare Other

## 2023-05-16 VITALS — BP 118/68 | Ht 63.75 in | Wt 192.8 lb

## 2023-05-16 DIAGNOSIS — Z0000 Encounter for general adult medical examination without abnormal findings: Secondary | ICD-10-CM

## 2023-05-16 NOTE — Patient Instructions (Addendum)
[name] , Thank you for taking time to come for your Medicare Wellness Visit. I appreciate your ongoing commitment to your health goals. Please review the following plan we discussed and let me know if I can assist you in the future.   Referrals/Orders/Follow-Ups/Clinician Recommendations: none  This is a list of the screening recommended for you and due dates:  Health Maintenance  Topic Date Due   Mammogram  02/14/2023   COVID-19 Vaccine (7 - 2023-24 season) 06/01/2023*   Zoster (Shingles) Vaccine (1 of 2) 08/16/2023*   Eye exam for diabetics  06/13/2023   DTaP/Tdap/Td vaccine (2 - Td or Tdap) 10/20/2023   Hemoglobin A1C  10/25/2023   Yearly kidney function blood test for diabetes  04/25/2024   Yearly kidney health urinalysis for diabetes  04/25/2024   Complete foot exam   05/02/2024   Medicare Annual Wellness Visit  05/15/2024   DEXA scan (bone density measurement)  06/29/2027   Colon Cancer Screening  09/27/2027   Pneumonia Vaccine  Completed   Flu Shot  Completed   Hepatitis C Screening  Completed   HPV Vaccine  Aged Out  *Topic was postponed. The date shown is not the original due date.    Advanced directives: (In Chart) A copy of your advanced directives are scanned into your chart should your provider ever need it.  Next Medicare Annual Wellness Visit scheduled for next year: Yes  05/20/24 @1pm  in person

## 2023-05-16 NOTE — Telephone Encounter (Signed)
Patient made aware of recommendations. Verbalized understanding. No further questions at this time

## 2023-05-16 NOTE — Telephone Encounter (Signed)
error 

## 2023-05-16 NOTE — Progress Notes (Signed)
Subjective:   [name] is a 77 y.o. female who presents for Medicare Annual (Subsequent) preventive examination.  Visit Complete: In person  Patient Medicare AWV questionnaire was completed by the patient on (not done); I have confirmed that all information answered by patient is correct and no changes since this date. Cardiac Risk Factors include: advanced age (>105men, >29 women);diabetes mellitus;dyslipidemia;hypertension;obesity (BMI >30kg/m2);sedentary lifestyle    Objective:    Today's Vitals   05/16/23 1301  BP: 118/68  Weight: 192 lb 12.8 oz (87.5 kg)  Height: 5' 3.75" (1.619 m)   Body mass index is 33.35 kg/m.     05/16/2023    1:21 PM 04/25/2023    8:31 PM 01/29/2023   11:44 AM 01/22/2023    1:49 AM 08/14/2022   10:39 AM 07/02/2022    2:57 PM 06/18/2022   11:07 AM  Advanced Directives  Does Patient Have a Medical Advance Directive? Yes Yes No No No No No  Type of Advance Directive Living will;Healthcare Power of Attorney Living will       Would patient like information on creating a medical advance directive?   No - Patient declined No - Patient declined No - Patient declined No - Patient declined No - Patient declined    Current Medications (verified) Outpatient Encounter Medications as of 05/16/2023  Medication Sig   ADVAIR HFA 230-21 MCG/ACT inhaler Inhale 2 puffs into the lungs 2 (two) times daily. 2 puffs 1-2 times daily depending on disease activity.   albuterol (PROVENTIL) (2.5 MG/3ML) 0.083% nebulizer solution Take 3 mLs (2.5 mg total) by nebulization every 6 (six) hours as needed for wheezing or shortness of breath.   albuterol (VENTOLIN HFA) 108 (90 Base) MCG/ACT inhaler INHALE 2 PUFFS BY MOUTH EVERY 6 HOURS AS NEEDED FOR WHEEZING OR SHORTNESS OF BREATH   anastrozole (ARIMIDEX) 1 MG tablet TAKE 1 TABLET(1 MG) BY MOUTH DAILY   atorvastatin (LIPITOR) 10 MG tablet TAKE 1 TABLET(10 MG) BY MOUTH DAILY   Blood Glucose Monitoring Suppl (ONETOUCH VERIO)  w/Device KIT Use to check blood sugar up to 2 times a day   cyclobenzaprine (FLEXERIL) 10 MG tablet Take 0.5-1 tablets (5-10 mg total) by mouth at bedtime as needed for muscle spasms.   desloratadine (CLARINEX) 5 MG tablet TAKE 1 TABLET(5 MG) BY MOUTH DAILY   EPINEPHrine 0.3 mg/0.3 mL IJ SOAJ injection USE AS DIRECTED FOR LIFE THREATENING ALLERGIC REACTIONS   estradiol (ESTRACE) 0.1 MG/GM vaginal cream Place 1 Applicatorful vaginally 2 (two) times a week. (As Needed)   famotidine (PEPCID) 40 MG tablet Take 1 (ONE) tablet by mouth every evening.   fluticasone (FLOVENT HFA) 220 MCG/ACT inhaler Inhale 2 puffs into the lungs 2 (two) times daily. 2 puffs 2 times daily during flare up.   gabapentin (NEURONTIN) 300 MG capsule Take one capsule in the morning, one at lunch and two at bedtime   glucose blood (ONETOUCH VERIO) test strip 1 strip 2 (two) times daily   GLYXAMBI 10-5 MG TABS TAKE 1 TABLET BY MOUTH DAILY   hydrOXYzine (ATARAX) 10 MG tablet Take 1 tablet (10 mg total) by mouth daily as needed for itching.   ipratropium (ATROVENT) 0.06 % nasal spray Place 2 sprays into both nostrils 2 (two) times daily as needed for rhinitis.   irbesartan (AVAPRO) 150 MG tablet TAKE 1 TABLET(150 MG) BY MOUTH DAILY   Lactase (DAIRY-RELIEF PO) Take 1 capsule by mouth daily as needed (eating dairy).   Lancets (ONETOUCH DELICA PLUS LANCET33G)  MISC USE TO CHECK BLOOD SUGAR UP TO TWICE DAILY   levalbuterol (XOPENEX) 1.25 MG/3ML nebulizer solution USE 1 VIAL VIA NEBULIZER EVERY 6 HOURS AS NEEDED FOR SHORTNESS OF BREATH OR WHEEZING   Magnesium 500 MG TABS Take 500 mg by mouth 2 (two) times daily.   Menthol, Topical Analgesic, (ABSORBINE PLUS JR EX) Apply 1 patch topically daily as needed (pain).   Menthol-Methyl Salicylate (SALONPAS JET SPRAY EX) Apply 1 spray topically daily as needed (knee pain).   montelukast (SINGULAIR) 10 MG tablet Take 1 tablet (10 mg total) by mouth at bedtime. For asthma control.   Mouthwashes  (BIOTENE DRY MOUTH MT) Use as directed 1 Dose in the mouth or throat daily as needed (dry mouth).   Nebulizer MISC 1 Device by Does not apply route as directed.   omeprazole (PRILOSEC) 40 MG capsule Take 40 mg by mouth every morning.   ondansetron (ZOFRAN-ODT) 4 MG disintegrating tablet Take 1 tablet (4 mg total) by mouth every 8 (eight) hours as needed for nausea or vomiting.   oxybutynin (DITROPAN-XL) 10 MG 24 hr tablet Take 10 mg by mouth daily.   Plecanatide (TRULANCE) 3 MG TABS Take 3 mg by mouth daily as needed (constipation).   Polyethyl Glycol-Propyl Glycol (SYSTANE OP) Place 1 drop into both eyes daily as needed (dry eyes).   polyethylene glycol powder (GLYCOLAX/MIRALAX) 17 GM/SCOOP powder Take 1 Container by mouth once.   PRESCRIPTION MEDICATION Inhale into the lungs at bedtime. CPAP   Respiratory Therapy Supplies (NEBULIZER MASK ADULT) MISC 1 kit by Does not apply route as directed.   rivaroxaban (XARELTO) 20 MG TABS tablet Take 1 tablet (20 mg total) by mouth daily with supper.   tolterodine (DETROL LA) 4 MG 24 hr capsule Take 4 mg by mouth at bedtime.   triamcinolone (NASACORT) 55 MCG/ACT AERO nasal inhaler Place 2 sprays into the nose daily.   Facility-Administered Encounter Medications as of 05/16/2023  Medication   tezepelumab-ekko (TEZSPIRE) 210 MG/1. syringe 210 mg    Allergies (verified) Dilantin [phenytoin], Latex, Oxycodone, Penicillins, Codeine, Hydrocodone, Nickel, and Pregabalin   History: Past Medical History:  Diagnosis Date   Allergic rhinitis    Allergy    Arthritis    Asthma    Breast cancer (HCC)    Chronic headache    Diabetes mellitus    Ductal carcinoma in situ (DCIS) of left breast 02/23/2022   DVT (deep venous thrombosis) (HCC)    Dyspnea    Heart murmur    History of radiation therapy    Left Breast 05/02/22-05/30/22- Dr. Antony Blackbird   Hypertension    Penicillin allergy 01/19/2020   Pulmonary embolism (HCC)    Pulmonary hypertension  (HCC)    Seizures (HCC)    Past Surgical History:  Procedure Laterality Date   ABDOMINAL HYSTERECTOMY     partial, has ovaries   BREAST LUMPECTOMY WITH RADIOACTIVE SEED LOCALIZATION Left 03/29/2022   Procedure: LEFT BREAST LUMPECTOMY WITH RADIOACTIVE SEED LOCALIZATION;  Surgeon: Harriette Bouillon, MD;  Location: [facility] OR;  Service: General;  Laterality: Left;   CARDIAC CATHETERIZATION  12/28/2010   Mod. pulmonary hypertension, normal coronary arteries   CARDIOVERSION N/A 11/23/2020   Procedure: CARDIOVERSION;  Surgeon: Thurmon Fair, MD;  Location: [facility];  Service: Cardiovascular;  Laterality: N/A;   CHOLECYSTECTOMY N/A 12/20/2021   Procedure: LAPAROSCOPIC CHOLECYSTECTOMY WITH INTRAOPERATIVE CHOLANGIOGRAM;  Surgeon: Darnell Level, MD;  Location: [facility] ORS;  Service: General;  Laterality: N/A;   DOPPLER ECHOCARDIOGRAPHY  10/08/2011   EF=>55%,mild  asymmetric LVH, mod. TR, mod. PH, mild to mod LA dilatation   IR LUMBAR DISC ASPIRATION W/IMG GUIDE  01/12/2020   KNEE ARTHROSCOPY Left    KNEE SURGERY     Nuclear Stress Test  05/20/2006   No ischemia   PARTIAL HYSTERECTOMY     PLANTAR FASCIA SURGERY     TONSILLECTOMY     Family History  Problem Relation Age of Onset   Cancer Mother        breast and colon cancer   Hypertension Mother    Clotting disorder Mother    Breast cancer Mother 31   Arthritis Mother    Stroke Mother    Diabetes Mother    Colon cancer Mother 50 - 22   Alcohol abuse Father    Cancer Sister        breast cancer   Multiple sclerosis Sister    Cancer Sister        colon cancer   Cancer Brother        colon cancer   Prostate cancer Half-Brother    Stomach cancer Half-Brother    Breast cancer Half-Sister 55       recurrence at 16, reports positive genetic testing   Arthritis Half-Sister    Diabetes Half-Sister    Colon cancer Half-Sister 22   Allergies Other        grandson   Allergic rhinitis Neg Hx    Angioedema Neg Hx    Asthma Neg Hx    Atopy Neg Hx     Eczema Neg Hx    Immunodeficiency Neg Hx    Urticaria Neg Hx    Social History   Socioeconomic History   Marital status: Widowed    Spouse name: Not on file   Number of children: Y   Years of education: Not on file   Highest education level: Not on file  Occupational History   Occupation: retired Programme researcher, broadcasting/film/video.   Tobacco Use   Smoking status: Never   Smokeless tobacco: Never  Vaping Use   Vaping status: Never Used  Substance and Sexual Activity   Alcohol use: Not Currently    Alcohol/week: 0.0 standard drinks of alcohol    Comment: occ glass on wine   Drug use: No   Sexual activity: Never  Other Topics Concern   Not on file  Social History Narrative   Widow    limited exercise.   Social Determinants of Health   Financial Resource Strain: Low Risk  (05/16/2023)   Overall Financial Resource Strain (CARDIA)    Difficulty of Paying Living Expenses: Not hard at all  Food Insecurity: No Food Insecurity (05/16/2023)   Hunger Vital Sign    Worried About Running Out of Food in the Last Year: Never true    Ran Out of Food in the Last Year: Never true  Transportation Needs: No Transportation Needs (05/16/2023)   PRAPARE - Administrator, Civil Service (Medical): No    Lack of Transportation (Non-Medical): No  Physical Activity: Insufficiently Active (05/16/2023)   Exercise Vital Sign    Days of Exercise per Week: 2 days    Minutes of Exercise per Session: 30 min  Stress: No Stress Concern Present (05/16/2023)   [employer] of Occupational Health - Occupational Stress Questionnaire    Feeling of Stress : Only a little  Social Connections: Moderately Isolated (05/16/2023)   Social Connection and Isolation Panel [NHANES]    Frequency of Communication with Friends and Family:  More than three times a week    Frequency of Social Gatherings with Friends and Family: Once a week    Attends Religious Services: More than 4 times per year    Active Member of [employer]  or Organizations: No    Attends Banker Meetings: Never    Marital Status: Widowed    Tobacco Counseling Counseling given: Not Answered   Clinical Intake:  Pre-visit preparation completed: No  Pain : No/denies pain     BMI - recorded: 33.35 Nutritional Status: BMI > 30  Obese Nutritional Risks: None Diabetes: Yes CBG done?: No Did pt. bring in CBG monitor from home?: No  How often do you need to have someone help you when you read instructions, pamphlets, or other written materials from your doctor or pharmacy?: 1 - Never  Interpreter Needed?: No  Comments: lives alone Information entered by :: B.Torell Minder,LPN   Activities of Daily Living    05/16/2023    1:22 PM  In your present state of health, do you have any difficulty performing the following activities:  Hearing? 1  Vision? 0  Difficulty concentrating or making decisions? 1  Comment memory  Walking or climbing stairs? 1  Dressing or bathing? 0  Doing errands, shopping? 1  Preparing Food and eating ? N  Using the Toilet? N  In the past six months, have you accidently leaked urine? N  Do you have problems with loss of bowel control? N  Managing your Medications? N  Managing your Finances? N  Housekeeping or managing your Housekeeping? N    Patient Care Team: Excell Seltzer, MD as PCP - General (Family Medicine) Croitoru, Rachelle Hora, MD as PCP - Cardiology (Cardiology) Lucie Leather Alvira Philips, MD as Consulting Physician (Allergy and Immunology) Darci Needle, MD as Consulting Physician (Endocrinology) Merwyn Katos, DPM as Consulting Physician (Podiatry) Cherlyn Roberts, MD as Consulting Physician (Dermatology) Aletha Halim, MD as Referring Physician (Specialist) Mauro Kaufmann, DC as Referring Physician (Chiropractic Medicine) Bernette Redbird, MD as Consulting Physician (Gastroenterology) Hallows, Walker Kehr, MD (Orthopedic Surgery) Dohmeier, Porfirio Mylar, MD as Consulting Physician (Neurology) Harriette Bouillon, MD as Consulting Physician (General Surgery) Antony Blackbird, MD as Consulting Physician (Radiation Oncology) Kathyrn Sheriff, [facility] (Inactive) as Pharmacist (Pharmacist) Wenda Overland, [place] as Social Worker Serena Croissant, MD as Consulting Physician (Hematology and Oncology) Alfredo Martinez, MD as Consulting Physician (Urology) Mateo Flow, MD as Consulting Physician (Ophthalmology)  Indicate any recent Medical Services you may have received from other than [facility] providers in the past year (date may be approximate).     Assessment:   This is a routine wellness examination for [place].  Hearing/Vision screen Hearing Screening - Comments:: Pt says her hearing is good with left hearing aid  Vision Screening - Comments:: Pt says her vision is good w/glasses Dr. Elmer Picker    Goals Addressed             This Visit's Progress    Increase water intake   On track    Starting 03/28/2018, I will continue to drink at least 6-8 glasses of water daily.      Managing anxiety   On track    . Activities and task to complete in order to accomplish goals.  Continue with compliance of taking medication prescribed by Doctor Patient to complete initial appointment with [facility] on 12/05/22 ar 2pm Continue with self care strategies-exercise, participation in social activities, ongoing mental health follow up  Depression Screen    05/16/2023    1:14 PM 05/03/2023   11:13 AM 04/18/2023   10:40 AM 02/07/2023    2:15 PM 11/26/2022    2:15 PM 09/12/2022    4:26 PM 09/07/2022   10:41 AM  PHQ 2/9 Scores  PHQ - 2 Score 0 0 0 0 0 0 1  PHQ- 9 Score   2 0 0      Fall Risk    05/16/2023    1:09 PM 05/03/2023   11:13 AM 04/18/2023   10:40 AM 02/04/2023    9:20 AM 08/16/2022   10:59 AM  Fall Risk   Falls in the past year? 1 1 0 0 0  Number falls in past yr: 0 0 0 0 0  Injury with Fall? 1 1 0 0 0  Risk for fall due to : Impaired mobility History of  fall(s);Impaired mobility No Fall Risks No Fall Risks No Fall Risks  Follow up Falls prevention discussed;Education provided Falls evaluation completed Falls evaluation completed Falls evaluation completed Falls evaluation completed    MEDICARE RISK AT HOME: Medicare Risk at Home Any stairs in or around the home?: No If so, are there any without handrails?: No Home free of loose throw rugs in walkways, pet beds, electrical cords, etc?: Yes Adequate lighting in your home to reduce risk of falls?: Yes Life alert?: No Use of a cane, walker or w/c?: Yes Grab bars in the bathroom?: No Shower chair or bench in shower?: No Elevated toilet seat or a handicapped toilet?: Yes  TIMED UP AND GO:  Was the test performed?  Yes  Length of time to ambulate 10 feet: 15 sec Gait slow and steady with assistive device    Cognitive Function:    03/28/2018   12:18 PM 03/25/2017   11:46 AM 03/21/2016    3:20 PM  MMSE - Mini Mental State Exam  Orientation to time 5 5 5   Orientation to Place 5 5 5   Registration 3 3 3   Attention/ Calculation 0 0 0  Recall 3 3 3   Language- name 2 objects 0 0 0  Language- repeat 1 1 1   Language- follow 3 step command 3 3 3   Language- read & follow direction 0 0 0  Write a sentence 0 0 0  Copy design 0 0 0  Total score 20 20 20         05/16/2023    1:23 PM  6CIT Screen  What Year? 0 points  What month? 0 points  What time? 0 points  Count back from 20 0 points  Months in reverse 0 points  Repeat phrase 0 points  Total Score 0 points    Immunizations Immunization History  Administered Date(s) Administered   Fluad Quad(high Dose 65+) 04/10/2019   Fluad Trivalent(High Dose 65+) 04/05/2023   Influenza,inj,Quad PF,6+ Mos 03/25/2014, 03/22/2016, 04/04/2017, 04/01/2018, 06/03/2020, 04/27/2021, 03/09/2022   Influenza,inj,quad, With Preservative 03/16/2018   Influenza-Unspecified 04/13/2015, 03/18/2017   PFIZER(Purple Top)SARS-COV-2 Vaccination 01/04/2020,  01/25/2020, 08/04/2020, 02/08/2021   Pfizer Covid-19 Vaccine Bivalent Booster 60yrs & up 06/30/2021, 02/28/2022   Pneumococcal Conjugate-13 05/05/2015   Pneumococcal Polysaccharide-23 04/03/2012   Tdap 10/19/2013   Zoster, Live 05/11/2013    TDAP status: Up to date  Flu Vaccine status: Up to date  Pneumococcal vaccine status: Up to date  Covid-19 vaccine status: Completed vaccines  Qualifies for Shingles Vaccine? Yes   Zostavax completed No   Shingrix Completed?: No.    Education has been  provided regarding the importance of this vaccine. Patient has been advised to call insurance company to determine out of pocket expense if they have not yet received this vaccine. Advised may also receive vaccine at local pharmacy or Health Dept. Verbalized acceptance and understanding.  Screening Tests Health Maintenance  Topic Date Due   MAMMOGRAM  02/14/2023   COVID-19 Vaccine (7 - 2023-24 season) 06/01/2023 (Originally 03/17/2023)   Zoster Vaccines- Shingrix (1 of 2) 08/16/2023 (Originally 07/28/1964)   OPHTHALMOLOGY EXAM  06/13/2023   DTaP/Tdap/Td (2 - Td or Tdap) 10/20/2023   HEMOGLOBIN A1C  10/25/2023   Diabetic kidney evaluation - eGFR measurement  04/25/2024   Diabetic kidney evaluation - Urine ACR  04/25/2024   FOOT EXAM  05/02/2024   Medicare Annual Wellness (AWV)  05/15/2024   DEXA SCAN  06/29/2027   Colonoscopy  09/27/2027   Pneumonia Vaccine 79+ Years old  Completed   INFLUENZA VACCINE  Completed   Hepatitis C Screening  Completed   HPV VACCINES  Aged Out    Health Maintenance  Health Maintenance Due  Topic Date Due   MAMMOGRAM  02/14/2023    Colorectal cancer screening: No longer required.   Mammogram status: No longer required due to age.  Bone Density status: Completed 06/28/2022. Results reflect: Bone density results: NORMAL. Repeat every 5 years.  Lung Cancer Screening: (Low Dose CT Chest recommended if Age 54-80 years, 20 pack-year currently smoking OR have  quit w/in 15years.) does not qualify.   Lung Cancer Screening Referral: no  Additional Screening:  Hepatitis C Screening: does not qualify; Completed 08/23/2015  Vision Screening: Recommended annual ophthalmology exams for early detection of glaucoma and other disorders of the eye. Is the patient up to date with their annual eye exam?  Yes  Who is the provider or what is the name of the office in which the patient attends annual eye exams? Dr Elmer Picker If pt is not established with a provider, would they like to be referred to a provider to establish care? No .   Dental Screening: Recommended annual dental exams for proper oral hygiene  Diabetic Foot Exam: Diabetic Foot Exam: Completed 05/03/23  Community Resource Referral / Chronic Care Management: CRR required this visit?  No   CCM required this visit?  No    Plan:     I have personally reviewed and noted the following in the patient's chart:   Medical and social history Use of alcohol, tobacco or illicit drugs  Current medications and supplements including opioid prescriptions. Patient is not currently taking opioid prescriptions. Functional ability and status Nutritional status Physical activity Advanced directives List of other physicians Hospitalizations, surgeries, and ER visits in previous 12 months Vitals Screenings to include cognitive, depression, and falls Referrals and appointments  In addition, I have reviewed and discussed with patient certain preventive protocols, quality metrics, and best practice recommendations. A written personalized care plan for preventive services as well as general preventive health recommendations were provided to patient.    Sue Lush, LPN   81/19/1478   After Visit Summary: (MyChart) Due to this being a telephonic visit, the after visit summary with patients personalized plan was offered to patient via MyChart   Nurse Notes: Pt says she is doing alright other than rt knee  pain and swelling from a fall 3 months ago. Pt has concerns about not sleeping well over the last week or so. She relays she will talk her PCP if continues.

## 2023-05-20 ENCOUNTER — Telehealth: Payer: Self-pay | Admitting: Family Medicine

## 2023-05-20 NOTE — Telephone Encounter (Signed)
Spoke with [name].  She picked up her Glyxambi on Friday but has lost it.  She wants to know if Dr. Ermalene Searing thinks it would be okay if she doesn't take this medication for a month until next refill.  Please advise.    I also let her know she can call her insurance company and see if the would give her an over ride for this prescription so she can get if filled again now with having to only pay her co-pay.  I also looked up GoodRx coupons but it would be over $600 with that coupon.

## 2023-05-20 NOTE — Telephone Encounter (Signed)
Pt called in and stated that she would like for the CMA to give her a call back concerning her GLYXAMBI 10-5 MG TABS  I ask pt if there was anything I could help her with she said no. Pt can be reached AT [phone]

## 2023-05-21 NOTE — Telephone Encounter (Signed)
Noted  

## 2023-05-21 NOTE — Telephone Encounter (Signed)
Patient called regarding this, states she was bale to have her insurance company override and refill her medication. Patient states if needed she can be reached at [phone]

## 2023-05-28 ENCOUNTER — Telehealth: Payer: Self-pay | Admitting: Allergy and Immunology

## 2023-05-28 ENCOUNTER — Other Ambulatory Visit: Payer: Self-pay

## 2023-05-28 ENCOUNTER — Ambulatory Visit (INDEPENDENT_AMBULATORY_CARE_PROVIDER_SITE_OTHER): Payer: Medicare Other | Admitting: Allergy and Immunology

## 2023-05-28 ENCOUNTER — Other Ambulatory Visit: Payer: Self-pay | Admitting: *Deleted

## 2023-05-28 ENCOUNTER — Encounter: Payer: Self-pay | Admitting: Allergy and Immunology

## 2023-05-28 VITALS — BP 140/78 | HR 71 | Temp 98.0°F | Resp 16 | Ht 64.0 in | Wt 191.4 lb

## 2023-05-28 DIAGNOSIS — K224 Dyskinesia of esophagus: Secondary | ICD-10-CM | POA: Diagnosis not present

## 2023-05-28 DIAGNOSIS — J4550 Severe persistent asthma, uncomplicated: Secondary | ICD-10-CM

## 2023-05-28 DIAGNOSIS — K219 Gastro-esophageal reflux disease without esophagitis: Secondary | ICD-10-CM | POA: Diagnosis not present

## 2023-05-28 DIAGNOSIS — J3089 Other allergic rhinitis: Secondary | ICD-10-CM

## 2023-05-28 DIAGNOSIS — R682 Dry mouth, unspecified: Secondary | ICD-10-CM | POA: Diagnosis not present

## 2023-05-28 DIAGNOSIS — T781XXA Other adverse food reactions, not elsewhere classified, initial encounter: Secondary | ICD-10-CM

## 2023-05-28 DIAGNOSIS — T781XXD Other adverse food reactions, not elsewhere classified, subsequent encounter: Secondary | ICD-10-CM

## 2023-05-28 DIAGNOSIS — D0512 Intraductal carcinoma in situ of left breast: Secondary | ICD-10-CM

## 2023-05-28 MED ORDER — EPINEPHRINE 0.3 MG/0.3ML IJ SOAJ: INTRAMUSCULAR | 1 refills | Status: DC

## 2023-05-28 MED ORDER — PANTOPRAZOLE SODIUM 40 MG PO TBEC: 40.0000 mg | DELAYED_RELEASE_TABLET | Freq: Every morning | ORAL | 1 refills | Status: DC

## 2023-05-28 MED ORDER — ADVAIR HFA 230-21 MCG/ACT IN AERO: 2.0000 | INHALATION_SPRAY | Freq: Two times a day (BID) | RESPIRATORY_TRACT | 1 refills | Status: DC

## 2023-05-28 MED ORDER — FAMOTIDINE 40 MG PO TABS: 40.0000 mg | ORAL_TABLET | Freq: Every evening | ORAL | 1 refills | Status: DC

## 2023-05-28 MED ORDER — TRIAMCINOLONE ACETONIDE 55 MCG/ACT NA AERO: 2.0000 | INHALATION_SPRAY | Freq: Every day | NASAL | 1 refills | Status: DC

## 2023-05-28 MED ORDER — IPRATROPIUM BROMIDE 0.06 % NA SOLN: 2.0000 | Freq: Two times a day (BID) | NASAL | 1 refills | Status: DC | PRN

## 2023-05-28 MED ORDER — MONTELUKAST SODIUM 10 MG PO TABS: 10.0000 mg | ORAL_TABLET | Freq: Every day | ORAL | 1 refills | Status: DC

## 2023-05-28 NOTE — Progress Notes (Signed)
RN successfully faxed mammogram and breast US orders to [facility].

## 2023-05-28 NOTE — Progress Notes (Unsigned)
[place] - [place] - [place] - [place] - [place]   Follow-up Note  Referring Provider: Excell Seltzer, MD Primary Provider: Excell Seltzer, MD Date of Office Visit: 05/28/2023  Subjective:   [name] (DOB: [birth year]) is a 77 y.o. female who [facility] in re-evaluation of the following:  HPI: Tyese returns to this clinic in evaluation of severe asthma, history of pulmonary hypertension/diastolic dysfunction, allergic rhinitis, LPR, esophageal dysfunction, and food allergy directed against shellfish and fish.  I last saw her in this clinic 12 February 2023.  When I last saw her in this clinic she had complaints of having postprandial sternal chest pain and a very slow swallowing where food appeared to actually get obstructed during the swallowing process and we arrange for her to have a modified barium swallow and I will review with her GI doctor.  It sounds as though she did have reviewed with her GI doctor and her modified barium swallow has been moved to December.  She still has the same issue she had back in July.  She has some coughing on occasion but it sounds as though she is coughing to clear her throat mostly.  She does not really have much wheezing or shortness of breath.  She has had no need to use any albuterol.  She continues on tezepelumab injections and Advair at this point.  She believes that her nose is doing pretty well at this point in time, she does occasionally use a nasal steroid.  She still has issues with dry mouth.  She is treating reflux with pantoprazole once a day and famotidine in the evening.  She does not consume fish or shellfish.  She recently had a fall and had a rather significant patella hematoma which appears to be resolving.  She has obtained this years flu vaccine.  Allergies as of 05/28/2023       Reactions   Dilantin [phenytoin] Swelling   facial swelling   Latex Hives   Oxycodone  Nausea And Vomiting, Nausea Only   Abdominal Pain, Vomiting   Penicillins Nausea And Vomiting, Swelling   Has patient had a PCN reaction causing immediate rash, facial/tongue/throat swelling, SOB or lightheadedness with hypotension patient had a PCN reaction causing severe rash involving mucus membranes or skin necrosis: [id number] Has patient had a PCN reaction that required hospitalization/No Has patient had a PCN reaction occurring within the last 10 years: No If all of the above answers are "NO", then may proceed with Cephalosporin use.   Codeine Nausea Only   Tolerates tylenol with codeine   Hydrocodone Nausea And Vomiting   Nickel Hives, Rash   Pregabalin Itching, Other (See Comments)   headaches/problem w/vision        Medication List    ABSORBINE PLUS JR EX Apply 1 patch topically daily as needed (pain).   Advair HFA 230-21 MCG/ACT inhaler Generic drug: fluticasone-salmeterol Inhale 2 puffs into the lungs 2 (two) times daily. 2 puffs 1-2 times daily depending on disease activity.   albuterol 108 (90 Base) MCG/ACT inhaler Commonly known as: VENTOLIN HFA INHALE 2 PUFFS BY MOUTH EVERY 6 HOURS AS NEEDED FOR WHEEZING OR SHORTNESS OF BREATH   albuterol (2.5 MG/3ML) 0.083% nebulizer solution Commonly known as: PROVENTIL Take 3 mLs (2.5 mg total) by nebulization every 6 (six) hours as needed for wheezing or shortness of breath.   anastrozole 1 MG tablet Commonly known as: ARIMIDEX TAKE 1 TABLET(1 MG) BY  MOUTH DAILY   atorvastatin 10 MG tablet Commonly known as: LIPITOR TAKE 1 TABLET(10 MG) BY MOUTH DAILY   BIOTENE DRY MOUTH MT Use as directed 1 Dose in the mouth or throat daily as needed (dry mouth).   cyclobenzaprine 10 MG tablet Commonly known as: FLEXERIL Take 0.5-1 tablets (5-10 mg total) by mouth at bedtime as needed for muscle spasms.   DAIRY-RELIEF PO Take 1 capsule by mouth daily as needed (eating dairy).   desloratadine 5 MG tablet Commonly known as:  CLARINEX TAKE 1 TABLET(5 MG) BY MOUTH DAILY   EPINEPHrine 0.3 mg/0.3 mL Soaj injection Commonly known as: EPI-PEN USE AS DIRECTED FOR LIFE THREATENING ALLERGIC REACTIONS   estradiol 0.1 MG/GM vaginal cream Commonly known as: ESTRACE Place 1 Applicatorful vaginally 2 (two) times a week. (As Needed)   famotidine 40 MG tablet Commonly known as: PEPCID Take 1 (ONE) tablet by mouth every evening.   fluticasone 220 MCG/ACT inhaler Commonly known as: Flovent HFA Inhale 2 puffs into the lungs 2 (two) times daily. 2 puffs 2 times daily during flare up.   gabapentin 300 MG capsule Commonly known as: NEURONTIN Take one capsule in the morning, one at lunch and two at bedtime   Glyxambi 10-5 MG Tabs Generic drug: Empagliflozin-linaGLIPtin TAKE 1 TABLET BY MOUTH DAILY   hydrOXYzine 10 MG tablet Commonly known as: ATARAX Take 1 tablet (10 mg total) by mouth daily as needed for itching.   ipratropium 0.06 % nasal spray Commonly known as: ATROVENT Place 2 sprays into both nostrils 2 (two) times daily as needed for rhinitis.   irbesartan 150 MG tablet Commonly known as: AVAPRO TAKE 1 TABLET(150 MG) BY MOUTH DAILY   levalbuterol 1.25 MG/3ML nebulizer solution Commonly known as: XOPENEX USE 1 VIAL VIA NEBULIZER EVERY 6 HOURS AS NEEDED FOR SHORTNESS OF BREATH OR WHEEZING   Magnesium 500 MG Tabs Take 500 mg by mouth 2 (two) times daily.   methimazole 5 MG tablet Commonly known as: TAPAZOLE Take 5 mg by mouth daily.   montelukast 10 MG tablet Commonly known as: SINGULAIR Take 1 tablet (10 mg total) by mouth at bedtime. For asthma control.   Nebulizer Mask Adult Misc 1 kit by Does not apply route as directed.   Nebulizer Misc 1 Device by Does not apply route as directed.   omeprazole 40 MG capsule Commonly known as: PRILOSEC Take 40 mg by mouth every morning.   ondansetron 4 MG disintegrating tablet Commonly known as: ZOFRAN-ODT Take 1 tablet (4 mg total) by mouth every 8  (eight) hours as needed for nausea or vomiting.   OneTouch Delica Plus Lancet33G Misc USE TO CHECK BLOOD SUGAR UP TO TWICE DAILY   OneTouch Verio test strip Generic drug: glucose blood 1 strip 2 (two) times daily   OneTouch Verio w/Device Kit Use to check blood sugar up to 2 times a day   oxybutynin 10 MG 24 hr tablet Commonly known as: DITROPAN-XL Take 10 mg by mouth daily.   polyethylene glycol powder 17 GM/SCOOP powder Commonly known as: GLYCOLAX/MIRALAX Take 1 Container by mouth once.   PRESCRIPTION MEDICATION Inhale into the lungs at bedtime. CPAP   rivaroxaban 20 MG Tabs tablet Commonly known as: Xarelto Take 1 tablet (20 mg total) by mouth daily with supper.   SALONPAS JET SPRAY EX Apply 1 spray topically daily as needed (knee pain).   SYSTANE OP Place 1 drop into both eyes daily as needed (dry eyes).   tolterodine 4 MG 24 hr  capsule Commonly known as: DETROL LA Take 4 mg by mouth at bedtime.   triamcinolone 55 MCG/ACT Aero nasal inhaler Commonly known as: NASACORT Place 2 sprays into the nose daily.   Trulance 3 MG Tabs Generic drug: Plecanatide Take 3 mg by mouth daily as needed (constipation).    Past Medical History:  Diagnosis Date   Allergic rhinitis    Allergy    Arthritis    Asthma    Breast cancer (HCC)    Chronic headache    Diabetes mellitus    Ductal carcinoma in situ (DCIS) of left breast 02/23/2022   DVT (deep venous thrombosis) (HCC)    Dyspnea    Heart murmur    History of radiation therapy    Left Breast 05/02/22-05/30/22- Dr. Antony Blackbird   Hypertension    Penicillin allergy 01/19/2020   Pulmonary embolism (HCC)    Pulmonary hypertension (HCC)    Seizures (HCC)     Past Surgical History:  Procedure Laterality Date   ABDOMINAL HYSTERECTOMY     partial, has ovaries   BREAST LUMPECTOMY WITH RADIOACTIVE SEED LOCALIZATION Left 03/29/2022   Procedure: LEFT BREAST LUMPECTOMY WITH RADIOACTIVE SEED LOCALIZATION;  Surgeon:  Harriette Bouillon, MD;  Location: [facility] OR;  Service: General;  Laterality: Left;   CARDIAC CATHETERIZATION  12/28/2010   Mod. pulmonary hypertension, normal coronary arteries   CARDIOVERSION N/A 11/23/2020   Procedure: CARDIOVERSION;  Surgeon: Thurmon Fair, MD;  Location: [facility];  Service: Cardiovascular;  Laterality: N/A;   CHOLECYSTECTOMY N/A 12/20/2021   Procedure: LAPAROSCOPIC CHOLECYSTECTOMY WITH INTRAOPERATIVE CHOLANGIOGRAM;  Surgeon: Darnell Level, MD;  Location: [facility] ORS;  Service: General;  Laterality: N/A;   DOPPLER ECHOCARDIOGRAPHY  10/08/2011   EF=>55%,mild asymmetric LVH, mod. TR, mod. PH, mild to mod LA dilatation   IR LUMBAR DISC ASPIRATION W/IMG GUIDE  01/12/2020   KNEE ARTHROSCOPY Left    KNEE SURGERY     Nuclear Stress Test  05/20/2006   No ischemia   PARTIAL HYSTERECTOMY     PLANTAR FASCIA SURGERY     TONSILLECTOMY      Review of systems negative except as noted in HPI / PMHx or noted below:  Review of Systems  Constitutional: Negative.   HENT: Negative.    Eyes: Negative.   Respiratory: Negative.    Cardiovascular: Negative.   Gastrointestinal: Negative.   Genitourinary: Negative.   Musculoskeletal: Negative.   Skin: Negative.   Neurological: Negative.   Endo/Heme/Allergies: Negative.   Psychiatric/Behavioral: Negative.       Objective:   Vitals:   05/28/23 0934  BP: (!) 140/78  Pulse: 71  Resp: 16  Temp: 98 F (36.7 C)  SpO2: 99%   Height: 5\' 4"  (162.6 cm)  Weight: 191 lb 6.4 oz (86.8 kg)   Physical Exam Constitutional:      Appearance: She is not diaphoretic.  HENT:     Head: Normocephalic.     Right Ear: Tympanic membrane, ear canal and external ear normal.     Left Ear: External ear normal.     Ears:     Comments: Left hearing aid    Nose: Nose normal. No mucosal edema or rhinorrhea.     Mouth/Throat:     Pharynx: Uvula midline. No oropharyngeal exudate.  Eyes:     Conjunctiva/sclera: Conjunctivae normal.  Neck:     Thyroid: No  thyromegaly.     Trachea: Trachea normal. No tracheal tenderness or tracheal deviation.  Cardiovascular:     Rate and Rhythm: Normal  rate. Rhythm irregular.     Heart sounds: S1 normal and S2 normal. Murmur (systolic) heard.     Comments: Intermittent bigeminy Pulmonary:     Effort: No respiratory distress.     Breath sounds: Normal breath sounds. No stridor. No wheezing or rales.  Lymphadenopathy:     Head:     Right side of head: No tonsillar adenopathy.     Left side of head: No tonsillar adenopathy.     Cervical: No cervical adenopathy.  Skin:    Findings: No erythema or rash.     Nails: There is no clubbing.  Neurological:     Mental Status: She is alert.     Diagnostics:    Spirometry was performed and demonstrated an FEV1 of *** at *** % of predicted.  Results of a barium swallow obtained 22 February 2023 identified the following:  The patient swallowed the barium without difficulty. Rapid sequence imaging of the pharynx in the AP and lateral projections demonstrates laryngeal penetration but no aspiration or mucosal abnormalities.   Mild to moderate esophageal dysmotility with a decreased primary stripping wave and mild tertiary contractions. There is no evidence of esophageal mass, stricture or ulceration. No significant hiatal hernia. No gastroesophageal reflux was elicited with the water siphon test.   At the conclusion of the study, a 13 mm barium tablet was administered. This passed without delay into the stomach.  Assessment and Plan:   1. Asthma, severe persistent, well-controlled   2. Other allergic rhinitis   3. LPRD (laryngopharyngeal reflux disease)   4. Adverse food reaction, initial encounter   5. Esophageal dysmotility   6. Dry mouth    1. Continue to treat and prevent airway inflammation:  A.  Advair 230 - 2 inhalations 2 times a day (empty lungs)  B.  Montelukast 10mg  - 1 tablet 1 time per day.  C.  Tezepelumab injections.    2. Continue  to treat reflux:  A. Pantoprazole 40mg  1 time per day B. Famotidine 40 mg in the PM.   C. OTC Tums throughout the day if needed D. Complete scheduled modified barium swallow E. Follow up with GI doctor  3. If needed:   A. Nasal saline several times per day  B. Ipratropium 0.06% -1-2 sprays each nostril 1-2 times per day   C. OTC NASACORT - 1 spray each nostril 1 time per day   D. Albuterol + Fluticasone 220 - 2 inhalations TOGETHER every 6 hours  E. Epi-pen  F.  Biotene mouthwash    4. Return to clinic in 12 weeks or earlier if there is a problem.  Shiela appears to be doing okay with her airway issue on her current plan of anti-inflammatory agents and she appears to be doing okay regarding her reflux on her current plan.  Certainly there is still a swallowing issue that needs to be further evaluated and I wonder if part of her swallowing issue is the fact that she does not really make saliva for the most part.  Fortunately her barium swallow did not identify anything that was particularly bad.  She appeared to have bigeminy today and she has close follow-up with her cardiologist regarding her issue and I have asked her to touch base with her cardiologist regarding this issue.  She can follow-up with her GI doctor regarding her reflux issue.  She can use an anti-inflammatory rescue treatment should it be required as noted above.  Will see her back in this clinic in  12 weeks or earlier if there is a problem.   Laurette Schimke, MD Allergy / Immunology  [facility]

## 2023-05-28 NOTE — Telephone Encounter (Signed)
Patient called stating she would like a nurse to give her a call back. Patient states she has questions about a medication that she was prescribed. Patient was unspecific about the question she has about the medication. Patient was also unspecific about what medication she has a question about.

## 2023-05-28 NOTE — Patient Instructions (Addendum)
  1. Continue to treat and prevent airway inflammation:  A.  Advair 230 - 2 inhalations 2 times a day (empty lungs)  B.  Montelukast 10mg  - 1 tablet 1 time per day.  C.  Tezepelumab injections.    2. Continue to treat reflux:  A. Pantoprazole 40mg  1 time per day B. Famotidine 40 mg in the PM.   C. OTC Tums throughout the day if needed D. Complete scheduled modified barium swallow E. Follow up with GI doctor  3. If needed:   A. Nasal saline several times per day  B. Ipratropium 0.06% -1-2 sprays each nostril 1-2 times per day   C. OTC NASACORT - 1 spray each nostril 1 time per day   D. Albuterol + Fluticasone 220 - 2 inhalations TOGETHER every 6 hours  E. Epi-pen  F.  Biotene mouthwash    4. Return to clinic in 12 weeks or earlier if there is a problem.

## 2023-05-29 ENCOUNTER — Encounter: Payer: Self-pay | Admitting: Allergy and Immunology

## 2023-05-29 NOTE — Telephone Encounter (Signed)
Called and discussed patients medications with her.

## 2023-05-30 ENCOUNTER — Encounter: Payer: Self-pay | Admitting: Allergy and Immunology

## 2023-06-03 DIAGNOSIS — M25532 Pain in left wrist: Secondary | ICD-10-CM | POA: Diagnosis not present

## 2023-06-03 DIAGNOSIS — L03114 Cellulitis of left upper limb: Secondary | ICD-10-CM | POA: Diagnosis not present

## 2023-06-03 DIAGNOSIS — M25432 Effusion, left wrist: Secondary | ICD-10-CM | POA: Diagnosis not present

## 2023-06-04 ENCOUNTER — Ambulatory Visit: Payer: Medicare Other | Admitting: *Deleted

## 2023-06-04 DIAGNOSIS — J4550 Severe persistent asthma, uncomplicated: Secondary | ICD-10-CM

## 2023-06-06 DIAGNOSIS — M25532 Pain in left wrist: Secondary | ICD-10-CM | POA: Diagnosis not present

## 2023-06-07 ENCOUNTER — Encounter: Payer: Self-pay | Admitting: Family Medicine

## 2023-06-07 ENCOUNTER — Ambulatory Visit: Payer: Medicare Other | Admitting: Family Medicine

## 2023-06-07 VITALS — BP 140/80 | HR 86 | Temp 97.6°F | Ht 63.75 in | Wt 193.1 lb

## 2023-06-07 DIAGNOSIS — M25532 Pain in left wrist: Secondary | ICD-10-CM

## 2023-06-07 LAB — URIC ACID: Uric Acid, Serum: 3.3 mg/dL (ref 2.4–7.0)

## 2023-06-07 NOTE — Progress Notes (Signed)
Patient ID: [name], female    DOB: 03-13-1946, 77 y.o.   MRN: [id number]  This visit was conducted in person.  BP (!) 140/80 (BP Location: Right Arm, Patient Position: Sitting, Cuff Size: Large)   Pulse 86   Temp 97.6 F (36.4 C) (Temporal)   Ht 5' 3.75" (1.619 m)   Wt 193 lb 2 oz (87.6 kg)   SpO2 99%   BMI 33.41 kg/m    CC:  Chief Complaint  Patient presents with   Hand Pain    Left-Seen at Urgent Care on Monday-They are concerned it she could have Gout    Subjective:   HPI: [name] is a 77 y.o. female presenting on 06/07/2023 for Hand Pain (Left-Seen at Urgent Care on Monday-They are concerned it she could have Gout)   Left hand pain: new onset.. no known injury.  Redness, swelling and pain in left hand and wrist. ( Pt feels that some of redness was her nickel allergy)   Reviewed recent urgent care office visit June 03, 2023 treated with Medrol Dosepak and antibiotics Symptoms have improved significantly  Saw Dr. Thad Ranger orthopedic surgery yesterday for left wrist pain.  Felt possibly could be due to gout.  Recommended PCP workup  . Has also had foot issue, though possible gout.  Relevant past medical, surgical, family and social history reviewed and updated as indicated. Interim medical history since our last visit reviewed. Allergies and medications reviewed and updated. Outpatient Medications Prior to Visit  Medication Sig Dispense Refill   ADVAIR HFA 230-21 MCG/ACT inhaler Inhale 2 puffs into the lungs 2 (two) times daily. 2 puffs 1-2 times daily depending on disease activity. 36 g 1   albuterol (PROVENTIL) (2.5 MG/3ML) 0.083% nebulizer solution Take 3 mLs (2.5 mg total) by nebulization every 6 (six) hours as needed for wheezing or shortness of breath. 150 mL 1   albuterol (VENTOLIN HFA) 108 (90 Base) MCG/ACT inhaler INHALE 2 PUFFS BY MOUTH EVERY 6 HOURS AS NEEDED FOR WHEEZING OR SHORTNESS OF BREATH 18 g 1   anastrozole (ARIMIDEX) 1 MG  tablet TAKE 1 TABLET(1 MG) BY MOUTH DAILY 90 tablet 3   atorvastatin (LIPITOR) 10 MG tablet TAKE 1 TABLET(10 MG) BY MOUTH DAILY 90 tablet 3   Blood Glucose Monitoring Suppl (ONETOUCH VERIO) w/Device KIT Use to check blood sugar up to 2 times a day 1 kit 0   cephALEXin (KEFLEX) 500 MG capsule Take 500 mg by mouth 3 (three) times daily.     cyclobenzaprine (FLEXERIL) 10 MG tablet Take 0.5-1 tablets (5-10 mg total) by mouth at bedtime as needed for muscle spasms. 20 tablet 0   desloratadine (CLARINEX) 5 MG tablet TAKE 1 TABLET(5 MG) BY MOUTH DAILY 90 tablet 1   EPINEPHrine 0.3 mg/0.3 mL IJ SOAJ injection USE AS DIRECTED FOR LIFE THREATENING ALLERGIC REACTIONS 2 each 1   estradiol (ESTRACE) 0.1 MG/GM vaginal cream Place 1 Applicatorful vaginally 2 (two) times a week. (As Needed)     famotidine (PEPCID) 40 MG tablet Take 1 tablet (40 mg total) by mouth at bedtime. Take 1 (ONE) tablet by mouth every evening. 90 tablet 1   fluticasone (FLOVENT HFA) 220 MCG/ACT inhaler Inhale 2 puffs into the lungs 2 (two) times daily. 2 puffs 2 times daily during flare up. 36 g 1   gabapentin (NEURONTIN) 300 MG capsule Take one capsule in the morning, one at lunch and two at bedtime 360 capsule 3   glucose blood (ONETOUCH  VERIO) test strip 1 strip 2 (two) times daily     GLYXAMBI 10-5 MG TABS TAKE 1 TABLET BY MOUTH DAILY 30 tablet 5   hydrOXYzine (ATARAX) 10 MG tablet Take 1 tablet (10 mg total) by mouth daily as needed for itching. 30 tablet 2   ipratropium (ATROVENT) 0.06 % nasal spray Place 2 sprays into both nostrils 2 (two) times daily as needed for rhinitis. 45 mL 1   irbesartan (AVAPRO) 150 MG tablet TAKE 1 TABLET(150 MG) BY MOUTH DAILY 90 tablet 2   Lactase (DAIRY-RELIEF PO) Take 1 capsule by mouth daily as needed (eating dairy).     Lancets (ONETOUCH DELICA PLUS LANCET33G) MISC USE TO CHECK BLOOD SUGAR UP TO TWICE DAILY 100 each 5   levalbuterol (XOPENEX) 1.25 MG/3ML nebulizer solution USE 1 VIAL VIA NEBULIZER  EVERY 6 HOURS AS NEEDED FOR SHORTNESS OF BREATH OR WHEEZING 90 mL 3   Magnesium 500 MG TABS Take 500 mg by mouth 2 (two) times daily.     Menthol, Topical Analgesic, (ABSORBINE PLUS JR EX) Apply 1 patch topically daily as needed (pain).     Menthol-Methyl Salicylate (SALONPAS JET SPRAY EX) Apply 1 spray topically daily as needed (knee pain).     methimazole (TAPAZOLE) 5 MG tablet Take 5 mg by mouth daily.     montelukast (SINGULAIR) 10 MG tablet Take 1 tablet (10 mg total) by mouth at bedtime. For asthma control. 90 tablet 1   Mouthwashes (BIOTENE DRY MOUTH MT) Use as directed 1 Dose in the mouth or throat daily as needed (dry mouth).     Nebulizer MISC 1 Device by Does not apply route as directed. 1 each 1   omeprazole (PRILOSEC) 40 MG capsule Take 40 mg by mouth every morning.     ondansetron (ZOFRAN-ODT) 4 MG disintegrating tablet Take 1 tablet (4 mg total) by mouth every 8 (eight) hours as needed for nausea or vomiting. 20 tablet 0   oxybutynin (DITROPAN-XL) 10 MG 24 hr tablet Take 10 mg by mouth daily.     pantoprazole (PROTONIX) 40 MG tablet Take 1 tablet (40 mg total) by mouth every morning. 90 tablet 1   Plecanatide (TRULANCE) 3 MG TABS Take 3 mg by mouth daily as needed (constipation).     Polyethyl Glycol-Propyl Glycol (SYSTANE OP) Place 1 drop into both eyes daily as needed (dry eyes).     polyethylene glycol powder (GLYCOLAX/MIRALAX) 17 GM/SCOOP powder Take 1 Container by mouth once.     predniSONE (DELTASONE) 20 MG tablet Take by mouth.     PRESCRIPTION MEDICATION Inhale into the lungs at bedtime. CPAP     Respiratory Therapy Supplies (NEBULIZER MASK ADULT) MISC 1 kit by Does not apply route as directed. 1 each 1   rivaroxaban (XARELTO) 20 MG TABS tablet Take 1 tablet (20 mg total) by mouth daily with supper. 90 tablet 1   tolterodine (DETROL LA) 4 MG 24 hr capsule Take 4 mg by mouth at bedtime.     triamcinolone (NASACORT) 55 MCG/ACT AERO nasal inhaler Place 2 sprays into the nose  daily. 50.7 mL 1   Facility-Administered Medications Prior to Visit  Medication Dose Route Frequency Provider Last Rate Last Admin   tezepelumab-ekko (TEZSPIRE) 210 MG/1. syringe 210 mg  210 mg Subcutaneous Q28 days Jessica Priest, MD   210 mg at 06/04/23 1009     Per HPI unless specifically indicated in ROS section below Review of Systems  Constitutional:  Negative for fatigue and fever.  HENT:  Negative for ear pain.   Eyes:  Negative for pain.  Respiratory:  Negative for chest tightness and shortness of breath.   Cardiovascular:  Negative for chest pain, palpitations and leg swelling.  Gastrointestinal:  Negative for abdominal pain.  Genitourinary:  Negative for dysuria.   Objective:  BP (!) 140/80 (BP Location: Right Arm, Patient Position: Sitting, Cuff Size: Large)   Pulse 86   Temp 97.6 F (36.4 C) (Temporal)   Ht 5' 3.75" (1.619 m)   Wt 193 lb 2 oz (87.6 kg)   SpO2 99%   BMI 33.41 kg/m   Wt Readings from Last 3 Encounters:  06/24/23 187 lb 4 oz (84.9 kg)  06/12/23 187 lb 4 oz (84.9 kg)  06/07/23 193 lb 2 oz (87.6 kg)      Physical Exam Musculoskeletal:     Left wrist: Swelling and deformity present. No effusion, tenderness, bony tenderness or snuff box tenderness. Normal range of motion.       Results for orders placed or performed in visit on 06/07/23  Uric Acid  Result Value Ref Range   Uric Acid, Serum 3.3 2.4 - 7.0 mg/dL   *Note: Due to a large number of results and/or encounters for the requested time period, some results have not been displayed. A complete set of results can be found in Results Review.    Assessment and Plan  Acute pain of left wrist Assessment & Plan: Acute, possible gout, significant improvement with Medrol Dosepak as well as antibiotics.  Will evaluate with uric acid level. Discussed low uric acid diet.  Orders: -     Uric acid    No follow-ups on file.   Kerby Nora, MD

## 2023-06-11 ENCOUNTER — Encounter: Payer: Self-pay | Admitting: Family Medicine

## 2023-06-12 ENCOUNTER — Ambulatory Visit: Payer: Medicare Other | Admitting: Family Medicine

## 2023-06-12 ENCOUNTER — Encounter: Payer: Self-pay | Admitting: Family Medicine

## 2023-06-12 VITALS — BP 126/78 | HR 93 | Temp 97.6°F | Ht 63.75 in | Wt 187.2 lb

## 2023-06-12 DIAGNOSIS — N949 Unspecified condition associated with female genital organs and menstrual cycle: Secondary | ICD-10-CM | POA: Insufficient documentation

## 2023-06-12 LAB — POCT WET PREP (WET MOUNT)
Clue Cells Wet Prep Whiff POC: NEGATIVE
Trichomonas Wet Prep HPF POC: ABSENT

## 2023-06-12 MED ORDER — FLUCONAZOLE 150 MG PO TABS: ORAL_TABLET | ORAL | 0 refills | Status: DC

## 2023-06-12 NOTE — Progress Notes (Signed)
Subjective:    Patient ID: [name], female    DOB: 1945/09/07, 77 y.o.   MRN: [id number]  HPI  Wt Readings from Last 3 Encounters:  06/12/23 187 lb 4 oz (84.9 kg)  06/07/23 193 lb 2 oz (87.6 kg)  05/28/23 191 lb 6.4 oz (86.8 kg)   32.39 kg/m  Vitals:   06/12/23 1055  BP: 126/78  Pulse: 93  Temp: 97.6 F (36.4 C)  SpO2: 96%    77 yo pt of Dr Ermalene Searing presents with vaginal symptoms/possible yeast     Had a medrol dose pack as well as keflex for wrist symptoms /gout (that was 11/18) From hand specialist    She usually gets yeast from prednisone  Took a diflucan - took it last week  Got better   Symptoms returned  Little itch Mostly burning  No discharge  No odor   No new partners  Not sexually active   Over the counter One day   No pelvic pain or cramping   No urinary symptoms   Results for orders placed or performed in visit on 06/12/23  POCT Wet Prep [employer] [facility])  Result Value Ref Range   Source Wet Prep POC vaginal    WBC, Wet Prep HPF POC few    Bacteria Wet Prep HPF POC Many (A) Few   BACTERIA WET PREP MORPHOLOGY POC     Clue Cells Wet Prep HPF POC Few (A) None   Clue Cells Wet Prep Whiff POC Negative Whiff    Yeast Wet Prep HPF POC Few (A) None   KOH Wet Prep POC Few (A) None   Trichomonas Wet Prep HPF POC Absent Absent   *Note: Due to a large number of results and/or encounters for the requested time period, some results have not been displayed. A complete set of results can be found in Results Review.      Patient Active Problem List   Diagnosis Date Noted   Vaginal discomfort 06/12/2023   Traumatic hematoma of right knee 05/03/2023   Urinary frequency 05/03/2023   Pruritus 09/04/2022   SSS (sick sinus syndrome) (HCC) 04/26/2022   Family history of breast cancer 02/28/2022   Family history of colon cancer in mother 02/28/2022   Ductal carcinoma in situ (DCIS) of left breast 02/23/2022   Family history of malignant neoplasm of  digestive organs 02/02/2022   Anemia 12/29/2021   Chronic constipation 12/29/2021   Chronic anticoagulation - Xerelto 12/18/2021   Hyperthyroidism 12/18/2021   Status post total left knee replacement 12/09/2021   Presence of right artificial knee joint 10/30/2021   Constipation by delayed colonic transit 09/25/2021   Atypical atrial flutter (HCC) 06/24/2021   Chronic diastolic heart failure (HCC) 06/24/2021   Stenosing tenosynovitis of finger of right hand 09/06/2020   Type 2 diabetes mellitus with diabetic neuropathy, unspecified (HCC) 06/20/2020   Dysphonia 04/08/2020   Discitis of lumbosacral region 01/04/2020   Hyperlipidemia associated with type 2 diabetes mellitus (HCC) 05/07/2019   Obstructive sleep apnea treated with continuous positive airway pressure (CPAP) 06/03/2018   Left thyroid nodule 02/06/2018   Insomnia secondary to chronic pain 01/26/2018   Class 3 drug-induced obesity with serious comorbidity and body mass index (BMI) of 40.0 to 44.9 in adult (HCC) 12/03/2017   Graves disease 10/15/2017   Acute diarrhea 01/04/2017   Bilateral high frequency sensorineural hearing loss 10/24/2016   Subjective tinnitus, bilateral 10/24/2016   Other fatigue 12/09/2015   Moderate persistent asthma 07/12/2015  Paroxysmal atrial fibrillation (HCC) 06/03/2015   Neuropathy due to secondary diabetes (HCC) 01/11/2015   Migraine headache without aura 08/24/2014   Vaginal candida 06/07/2014   Allergic rhinitis 03/25/2014   DVT (deep venous thrombosis) hx of  03/25/2014   Osteoarthritis of right knee 03/25/2014    ? of Seizure disorder 03/25/2014   Depression, major, in remission (HCC) 03/25/2014   Hypertension associated with diabetes (HCC) 03/25/2014   GERD (gastroesophageal reflux disease) 03/25/2014   History of DVT (deep vein thrombosis) 03/25/2014   History of pulmonary embolism 10/09/2013   Asthma, moderate persistent 01/24/2011   Pulmonary hypertension (HCC) 01/11/2011   Past  Medical History:  Diagnosis Date   Allergic rhinitis    Allergy    Arthritis    Asthma    Breast cancer (HCC)    Chronic headache    Diabetes mellitus    Ductal carcinoma in situ (DCIS) of left breast 02/23/2022   DVT (deep venous thrombosis) (HCC)    Dyspnea    Heart murmur    History of radiation therapy    Left Breast 05/02/22-05/30/22- Dr. Antony Blackbird   Hypertension    Penicillin allergy 01/19/2020   Pulmonary embolism (HCC)    Pulmonary hypertension (HCC)    Seizures (HCC)    Past Surgical History:  Procedure Laterality Date   ABDOMINAL HYSTERECTOMY     partial, has ovaries   BREAST LUMPECTOMY WITH RADIOACTIVE SEED LOCALIZATION Left 03/29/2022   Procedure: LEFT BREAST LUMPECTOMY WITH RADIOACTIVE SEED LOCALIZATION;  Surgeon: Harriette Bouillon, MD;  Location: [facility] OR;  Service: General;  Laterality: Left;   CARDIAC CATHETERIZATION  12/28/2010   Mod. pulmonary hypertension, normal coronary arteries   CARDIOVERSION N/A 11/23/2020   Procedure: CARDIOVERSION;  Surgeon: Thurmon Fair, MD;  Location: [facility]COPY;  Service: Cardiovascular;  Laterality: N/A;   CHOLECYSTECTOMY N/A 12/20/2021   Procedure: LAPAROSCOPIC CHOLECYSTECTOMY WITH INTRAOPERATIVE CHOLANGIOGRAM;  Surgeon: Darnell Level, MD;  Location: [facility] ORS;  Service: General;  Laterality: N/A;   DOPPLER ECHOCARDIOGRAPHY  10/08/2011   EF=>55%,mild asymmetric LVH, mod. TR, mod. PH, mild to mod LA dilatation   IR LUMBAR DISC ASPIRATION W/IMG GUIDE  01/12/2020   KNEE ARTHROSCOPY Left    KNEE SURGERY     Nuclear Stress Test  05/20/2006   No ischemia   PARTIAL HYSTERECTOMY     PLANTAR FASCIA SURGERY     TONSILLECTOMY     Social History   Tobacco Use   Smoking status: Never    Passive exposure: Never   Smokeless tobacco: Never  Vaping Use   Vaping status: Never Used  Substance Use Topics   Alcohol use: Not Currently    Alcohol/week: 0.0 standard drinks of alcohol    Comment: occ glass on wine   Drug use: No   Family History   Problem Relation Age of Onset   Cancer Mother        breast and colon cancer   Hypertension Mother    Clotting disorder Mother    Breast cancer Mother 74   Arthritis Mother    Stroke Mother    Diabetes Mother    Colon cancer Mother 58 - 2   Alcohol abuse Father    Cancer Sister        breast cancer   Multiple sclerosis Sister    Cancer Sister        colon cancer   Cancer Brother        colon cancer   Prostate cancer Half-Brother  Stomach cancer Half-Brother    Breast cancer Half-Sister 76       recurrence at 30, reports positive genetic testing   Arthritis Half-Sister    Diabetes Half-Sister    Colon cancer Half-Sister 48   Allergies Other        grandson   Allergic rhinitis Neg Hx    Angioedema Neg Hx    Asthma Neg Hx    Atopy Neg Hx    Eczema Neg Hx    Immunodeficiency Neg Hx    Urticaria Neg Hx    Allergies  Allergen Reactions   Dilantin [Phenytoin] Swelling    facial swelling   Latex Hives   Oxycodone Nausea And Vomiting and Nausea Only    Abdominal Pain, Vomiting   Penicillins Nausea And Vomiting and Swelling    Has patient had a PCN reaction causing immediate rash, facial/tongue/throat swelling, SOB or lightheadedness with hypotension patient had a PCN reaction causing severe rash involving mucus membranes or skin necrosis: [id number] Has patient had a PCN reaction that required hospitalization/No Has patient had a PCN reaction occurring within the last 10 years: No If all of the above answers are "NO", then may proceed with Cephalosporin use.      Codeine Nausea Only    Tolerates tylenol with codeine   Hydrocodone Nausea And Vomiting   Nickel Hives and Rash   Pregabalin Itching and Other (See Comments)    headaches/problem w/vision   Current Outpatient Medications on File Prior to Visit  Medication Sig Dispense Refill   ADVAIR HFA 230-21 MCG/ACT inhaler Inhale 2 puffs into the lungs 2 (two) times daily. 2 puffs 1-2 times daily depending on  disease activity. 36 g 1   albuterol (PROVENTIL) (2.5 MG/3ML) 0.083% nebulizer solution Take 3 mLs (2.5 mg total) by nebulization every 6 (six) hours as needed for wheezing or shortness of breath. 150 mL 1   albuterol (VENTOLIN HFA) 108 (90 Base) MCG/ACT inhaler INHALE 2 PUFFS BY MOUTH EVERY 6 HOURS AS NEEDED FOR WHEEZING OR SHORTNESS OF BREATH 18 g 1   anastrozole (ARIMIDEX) 1 MG tablet TAKE 1 TABLET(1 MG) BY MOUTH DAILY 90 tablet 3   atorvastatin (LIPITOR) 10 MG tablet TAKE 1 TABLET(10 MG) BY MOUTH DAILY 90 tablet 3   Blood Glucose Monitoring Suppl (ONETOUCH VERIO) w/Device KIT Use to check blood sugar up to 2 times a day 1 kit 0   cephALEXin (KEFLEX) 500 MG capsule Take 500 mg by mouth 3 (three) times daily.     cyclobenzaprine (FLEXERIL) 10 MG tablet Take 0.5-1 tablets (5-10 mg total) by mouth at bedtime as needed for muscle spasms. 20 tablet 0   desloratadine (CLARINEX) 5 MG tablet TAKE 1 TABLET(5 MG) BY MOUTH DAILY 90 tablet 1   EPINEPHrine 0.3 mg/0.3 mL IJ SOAJ injection USE AS DIRECTED FOR LIFE THREATENING ALLERGIC REACTIONS 2 each 1   estradiol (ESTRACE) 0.1 MG/GM vaginal cream Place 1 Applicatorful vaginally 2 (two) times a week. (As Needed)     famotidine (PEPCID) 40 MG tablet Take 1 tablet (40 mg total) by mouth at bedtime. Take 1 (ONE) tablet by mouth every evening. 90 tablet 1   fluticasone (FLOVENT HFA) 220 MCG/ACT inhaler Inhale 2 puffs into the lungs 2 (two) times daily. 2 puffs 2 times daily during flare up. 36 g 1   gabapentin (NEURONTIN) 300 MG capsule Take one capsule in the morning, one at lunch and two at bedtime 360 capsule 3   glucose blood (ONETOUCH VERIO) test strip  1 strip 2 (two) times daily     GLYXAMBI 10-5 MG TABS TAKE 1 TABLET BY MOUTH DAILY 30 tablet 5   hydrOXYzine (ATARAX) 10 MG tablet Take 1 tablet (10 mg total) by mouth daily as needed for itching. 30 tablet 2   ipratropium (ATROVENT) 0.06 % nasal spray Place 2 sprays into both nostrils 2 (two) times daily as  needed for rhinitis. 45 mL 1   irbesartan (AVAPRO) 150 MG tablet TAKE 1 TABLET(150 MG) BY MOUTH DAILY 90 tablet 2   Lactase (DAIRY-RELIEF PO) Take 1 capsule by mouth daily as needed (eating dairy).     Lancets (ONETOUCH DELICA PLUS LANCET33G) MISC USE TO CHECK BLOOD SUGAR UP TO TWICE DAILY 100 each 5   levalbuterol (XOPENEX) 1.25 MG/3ML nebulizer solution USE 1 VIAL VIA NEBULIZER EVERY 6 HOURS AS NEEDED FOR SHORTNESS OF BREATH OR WHEEZING 90 mL 3   Magnesium 500 MG TABS Take 500 mg by mouth 2 (two) times daily.     Menthol, Topical Analgesic, (ABSORBINE PLUS JR EX) Apply 1 patch topically daily as needed (pain).     Menthol-Methyl Salicylate (SALONPAS JET SPRAY EX) Apply 1 spray topically daily as needed (knee pain).     methimazole (TAPAZOLE) 5 MG tablet Take 5 mg by mouth daily.     montelukast (SINGULAIR) 10 MG tablet Take 1 tablet (10 mg total) by mouth at bedtime. For asthma control. 90 tablet 1   Mouthwashes (BIOTENE DRY MOUTH MT) Use as directed 1 Dose in the mouth or throat daily as needed (dry mouth).     Nebulizer MISC 1 Device by Does not apply route as directed. 1 each 1   omeprazole (PRILOSEC) 40 MG capsule Take 40 mg by mouth every morning.     ondansetron (ZOFRAN-ODT) 4 MG disintegrating tablet Take 1 tablet (4 mg total) by mouth every 8 (eight) hours as needed for nausea or vomiting. 20 tablet 0   oxybutynin (DITROPAN-XL) 10 MG 24 hr tablet Take 10 mg by mouth daily.     pantoprazole (PROTONIX) 40 MG tablet Take 1 tablet (40 mg total) by mouth every morning. 90 tablet 1   Plecanatide (TRULANCE) 3 MG TABS Take 3 mg by mouth daily as needed (constipation).     Polyethyl Glycol-Propyl Glycol (SYSTANE OP) Place 1 drop into both eyes daily as needed (dry eyes).     polyethylene glycol powder (GLYCOLAX/MIRALAX) 17 GM/SCOOP powder Take 1 Container by mouth once.     PRESCRIPTION MEDICATION Inhale into the lungs at bedtime. CPAP     Respiratory Therapy Supplies (NEBULIZER MASK ADULT) MISC  1 kit by Does not apply route as directed. 1 each 1   rivaroxaban (XARELTO) 20 MG TABS tablet Take 1 tablet (20 mg total) by mouth daily with supper. 90 tablet 1   tolterodine (DETROL LA) 4 MG 24 hr capsule Take 4 mg by mouth at bedtime.     triamcinolone (NASACORT) 55 MCG/ACT AERO nasal inhaler Place 2 sprays into the nose daily. 50.7 mL 1   Current Facility-Administered Medications on File Prior to Visit  Medication Dose Route Frequency Provider Last Rate Last Admin   tezepelumab-ekko (TEZSPIRE) 210 MG/1. syringe 210 mg  210 mg Subcutaneous Q28 days Jessica Priest, MD   210 mg at 06/04/23 1009    Review of Systems  Genitourinary:  Positive for vaginal pain. Negative for difficulty urinating, dysuria, flank pain, frequency, hematuria, pelvic pain, vaginal bleeding and vaginal discharge.  Musculoskeletal:  Wrist pain and swelling is improving        Objective:   Physical Exam Exam conducted with a chaperone present.  Constitutional:      General: She is not in acute distress.    Appearance: Normal appearance. She is obese. She is not ill-appearing.  Cardiovascular:     Rate and Rhythm: Tachycardia present.  Pulmonary:     Effort: Pulmonary effort is normal. No respiratory distress.  Abdominal:     Comments: No suprapubic tenderness or fullness    Genitourinary:    Exam position: Supine.     Pubic Area: No rash or pubic lice.      Labia:        Right: Tenderness present. No rash, lesion or injury.        Left: Tenderness present. No rash, lesion or injury.      Urethra: No prolapse or urethral swelling.     Vagina: Tenderness present. No vaginal discharge or bleeding.  Skin:    Findings: No erythema or rash.  Neurological:     Mental Status: She is alert.  Psychiatric:        Mood and Affect: Mood normal.           Assessment & Plan:   Problem List Items Addressed This Visit       Other   Vaginal discomfort - Primary    Some burning and itching after  a course of medrol and keflex (still has 3 d of keflex) One diflucan helped but symptoms returned  Reassuring exam Wet prep notes yeast and also some clue cells  Will treat yeast vaginitis with diflucan 150 mg now and repeat in 3 d  Also encouraged to eat yogurt or take a probiotic to help restore vaginal flora   At that time if symptoms are not resolved, may need treatment for BV She will call and update next week   Call back and Er precautions noted in detail today         Relevant Orders   POCT Wet Prep [facility]) (Completed)

## 2023-06-12 NOTE — Assessment & Plan Note (Signed)
Some burning and itching after a course of medrol and keflex (still has 3 d of keflex) One diflucan helped but symptoms returned  Reassuring exam Wet prep notes yeast and also some clue cells  Will treat yeast vaginitis with diflucan 150 mg now and repeat in 3 d  Also encouraged to eat yogurt or take a probiotic to help restore vaginal flora   At that time if symptoms are not resolved, may need treatment for BV She will call and update next week   Call back and Er precautions noted in detail today

## 2023-06-12 NOTE — Patient Instructions (Addendum)
Eat yogurt daily or take an over the counter probiotic (Align is a brand) to get your vaginal flora back to normal   Take diflucan now and another dose in 3 days   Let us know if symptoms are not resolved next week   You need to finish your antibiotic   If symptoms worsen let us know earlier

## 2023-06-18 DIAGNOSIS — R591 Generalized enlarged lymph nodes: Secondary | ICD-10-CM | POA: Diagnosis not present

## 2023-06-18 DIAGNOSIS — E669 Obesity, unspecified: Secondary | ICD-10-CM | POA: Diagnosis not present

## 2023-06-18 DIAGNOSIS — Z6831 Body mass index (BMI) 31.0-31.9, adult: Secondary | ICD-10-CM | POA: Diagnosis not present

## 2023-06-18 DIAGNOSIS — M79672 Pain in left foot: Secondary | ICD-10-CM | POA: Diagnosis not present

## 2023-06-18 DIAGNOSIS — M2560 Stiffness of unspecified joint, not elsewhere classified: Secondary | ICD-10-CM | POA: Diagnosis not present

## 2023-06-18 DIAGNOSIS — M79642 Pain in left hand: Secondary | ICD-10-CM | POA: Diagnosis not present

## 2023-06-18 DIAGNOSIS — M79641 Pain in right hand: Secondary | ICD-10-CM | POA: Diagnosis not present

## 2023-06-18 DIAGNOSIS — M2540 Effusion, unspecified joint: Secondary | ICD-10-CM | POA: Diagnosis not present

## 2023-06-19 ENCOUNTER — Encounter (HOSPITAL_COMMUNITY)
Admission: RE | Disposition: A | Discharge status: Home / Self Care | Payer: Self-pay | Source: Home / Self Care | Attending: Gastroenterology

## 2023-06-19 ENCOUNTER — Encounter (HOSPITAL_COMMUNITY): Payer: Self-pay | Admitting: Gastroenterology

## 2023-06-19 ENCOUNTER — Ambulatory Visit (HOSPITAL_COMMUNITY)
Admission: RE | Admit: 2023-06-19 | Discharge: 2023-06-19 | Disposition: A | Discharge status: Home / Self Care | Payer: Medicare Other | Attending: Gastroenterology | Admitting: Gastroenterology

## 2023-06-19 DIAGNOSIS — R1310 Dysphagia, unspecified: Secondary | ICD-10-CM | POA: Diagnosis not present

## 2023-06-19 HISTORY — PX: ESOPHAGEAL MANOMETRY: SHX5429

## 2023-06-19 SURGERY — MANOMETRY, ESOPHAGUS

## 2023-06-19 MED ORDER — LIDOCAINE VISCOUS HCL 2 % MT SOLN
OROMUCOSAL | Status: AC
  Filled 2023-06-19: qty 15

## 2023-06-19 SURGICAL SUPPLY — 2 items
FACESHIELD LNG OPTICON STERILE (SAFETY) IMPLANT
GLOVE BIO SURGEON STRL SZ8 (GLOVE) ×2 IMPLANT

## 2023-06-19 NOTE — Progress Notes (Signed)
Esophageal Manometry done per protocol. Patient tolerated well without distress or complication.  

## 2023-06-24 ENCOUNTER — Ambulatory Visit: Payer: Medicare Other | Admitting: Podiatry

## 2023-06-24 ENCOUNTER — Encounter (HOSPITAL_COMMUNITY): Payer: Self-pay | Admitting: Gastroenterology

## 2023-06-24 VITALS — Ht 63.75 in | Wt 187.2 lb

## 2023-06-24 DIAGNOSIS — B351 Tinea unguium: Secondary | ICD-10-CM | POA: Diagnosis not present

## 2023-06-24 DIAGNOSIS — Q828 Other specified congenital malformations of skin: Secondary | ICD-10-CM | POA: Diagnosis not present

## 2023-06-24 DIAGNOSIS — M79676 Pain in unspecified toe(s): Secondary | ICD-10-CM

## 2023-06-24 DIAGNOSIS — L84 Corns and callosities: Secondary | ICD-10-CM

## 2023-06-24 DIAGNOSIS — E1142 Type 2 diabetes mellitus with diabetic polyneuropathy: Secondary | ICD-10-CM

## 2023-06-24 NOTE — Assessment & Plan Note (Signed)
Acute, possible gout, significant improvement with Medrol Dosepak as well as antibiotics.  Will evaluate with uric acid level. Discussed low uric acid diet.

## 2023-06-25 DIAGNOSIS — K224 Dyskinesia of esophagus: Secondary | ICD-10-CM | POA: Diagnosis not present

## 2023-06-27 ENCOUNTER — Encounter: Payer: Self-pay | Admitting: Family Medicine

## 2023-06-27 ENCOUNTER — Ambulatory Visit (INDEPENDENT_AMBULATORY_CARE_PROVIDER_SITE_OTHER): Payer: Medicare Other | Admitting: Family Medicine

## 2023-06-27 VITALS — BP 128/80 | HR 54 | Temp 97.7°F | Ht 63.75 in | Wt 193.0 lb

## 2023-06-27 DIAGNOSIS — N898 Other specified noninflammatory disorders of vagina: Secondary | ICD-10-CM | POA: Diagnosis not present

## 2023-06-27 MED ORDER — TRAMADOL HCL 50 MG PO TABS: 50.0000 mg | ORAL_TABLET | Freq: Three times a day (TID) | ORAL | 0 refills | Status: DC | PRN

## 2023-06-27 NOTE — Progress Notes (Signed)
Patient ID: [name], female    DOB: 11-Dec-1945, 77 y.o.   MRN: [id number]  This visit was conducted in person.  BP 128/80   Pulse (!) 54   Temp 97.7 F (36.5 C)   Ht 5' 3.75" (1.619 m)   Wt 193 lb (87.5 kg)   SpO2 98%   BMI 33.39 kg/m    CC:  Chief Complaint  Patient presents with   Medical Management of Chronic Issues    Here for yeast infection f/u.    Subjective:   HPI: [name] is a 77 y.o. female presenting on 06/27/2023 for Medical Management of Chronic Issues (Here for yeast infection f/u.)  Reviewed last office visit from June 12, 2023 with Dr. Milinda Antis Had noted burning and itching vaginally after course of Medrol and Keflex. In office wet prep showed yeast as well as clue cels.  Treated with fluconazole 150 mg p.o. x 2 over 3 days.  Improved some, bu area otrectum.. treated with desitin.  still raw vaginal feeling. Itchy  Stopped urinary incontinence panties and changed back to pad in underware.   Now only noting slight irritation with wiping. No discharge.  No dysuria.  No fever.  No abdominal pain.     Relevant past medical, surgical, family and social history reviewed and updated as indicated. Interim medical history since our last visit reviewed. Allergies and medications reviewed and updated. Outpatient Medications Prior to Visit  Medication Sig Dispense Refill   ADVAIR HFA 230-21 MCG/ACT inhaler Inhale 2 puffs into the lungs 2 (two) times daily. 2 puffs 1-2 times daily depending on disease activity. 36 g 1   albuterol (PROVENTIL) (2.5 MG/3ML) 0.083% nebulizer solution Take 3 mLs (2.5 mg total) by nebulization every 6 (six) hours as needed for wheezing or shortness of breath. 150 mL 1   albuterol (VENTOLIN HFA) 108 (90 Base) MCG/ACT inhaler INHALE 2 PUFFS BY MOUTH EVERY 6 HOURS AS NEEDED FOR WHEEZING OR SHORTNESS OF BREATH 18 g 1   anastrozole (ARIMIDEX) 1 MG tablet TAKE 1 TABLET(1 MG) BY MOUTH DAILY 90 tablet 3   atorvastatin  (LIPITOR) 10 MG tablet TAKE 1 TABLET(10 MG) BY MOUTH DAILY 90 tablet 3   Blood Glucose Monitoring Suppl (ONETOUCH VERIO) w/Device KIT Use to check blood sugar up to 2 times a day 1 kit 0   cyclobenzaprine (FLEXERIL) 10 MG tablet Take 0.5-1 tablets (5-10 mg total) by mouth at bedtime as needed for muscle spasms. 20 tablet 0   desloratadine (CLARINEX) 5 MG tablet TAKE 1 TABLET(5 MG) BY MOUTH DAILY 90 tablet 1   EPINEPHrine 0.3 mg/0.3 mL IJ SOAJ injection USE AS DIRECTED FOR LIFE THREATENING ALLERGIC REACTIONS 2 each 1   estradiol (ESTRACE) 0.1 MG/GM vaginal cream Place 1 Applicatorful vaginally 2 (two) times a week. (As Needed)     famotidine (PEPCID) 40 MG tablet Take 1 tablet (40 mg total) by mouth at bedtime. Take 1 (ONE) tablet by mouth every evening. 90 tablet 1   fluconazole (DIFLUCAN) 150 MG tablet Take one pill by mouth now and repeat dose in 3 days 2 tablet 0   fluticasone (FLOVENT HFA) 220 MCG/ACT inhaler Inhale 2 puffs into the lungs 2 (two) times daily. 2 puffs 2 times daily during flare up. 36 g 1   gabapentin (NEURONTIN) 300 MG capsule Take one capsule in the morning, one at lunch and two at bedtime 360 capsule 3   glucose blood (ONETOUCH VERIO) test strip 1 strip 2 (  two) times daily     GLYXAMBI 10-5 MG TABS TAKE 1 TABLET BY MOUTH DAILY 30 tablet 5   hydrOXYzine (ATARAX) 10 MG tablet Take 1 tablet (10 mg total) by mouth daily as needed for itching. 30 tablet 2   ipratropium (ATROVENT) 0.06 % nasal spray Place 2 sprays into both nostrils 2 (two) times daily as needed for rhinitis. 45 mL 1   irbesartan (AVAPRO) 150 MG tablet TAKE 1 TABLET(150 MG) BY MOUTH DAILY 90 tablet 2   Lactase (DAIRY-RELIEF PO) Take 1 capsule by mouth daily as needed (eating dairy).     Lancets (ONETOUCH DELICA PLUS LANCET33G) MISC USE TO CHECK BLOOD SUGAR UP TO TWICE DAILY 100 each 5   levalbuterol (XOPENEX) 1.25 MG/3ML nebulizer solution USE 1 VIAL VIA NEBULIZER EVERY 6 HOURS AS NEEDED FOR SHORTNESS OF BREATH OR  WHEEZING 90 mL 3   Magnesium 500 MG TABS Take 500 mg by mouth 2 (two) times daily.     Menthol, Topical Analgesic, (ABSORBINE PLUS JR EX) Apply 1 patch topically daily as needed (pain).     Menthol-Methyl Salicylate (SALONPAS JET SPRAY EX) Apply 1 spray topically daily as needed (knee pain).     methimazole (TAPAZOLE) 5 MG tablet Take 5 mg by mouth daily.     montelukast (SINGULAIR) 10 MG tablet Take 1 tablet (10 mg total) by mouth at bedtime. For asthma control. 90 tablet 1   Mouthwashes (BIOTENE DRY MOUTH MT) Use as directed 1 Dose in the mouth or throat daily as needed (dry mouth).     Nebulizer MISC 1 Device by Does not apply route as directed. 1 each 1   omeprazole (PRILOSEC) 40 MG capsule Take 40 mg by mouth every morning.     ondansetron (ZOFRAN-ODT) 4 MG disintegrating tablet Take 1 tablet (4 mg total) by mouth every 8 (eight) hours as needed for nausea or vomiting. 20 tablet 0   oxybutynin (DITROPAN-XL) 10 MG 24 hr tablet Take 10 mg by mouth daily.     pantoprazole (PROTONIX) 40 MG tablet Take 1 tablet (40 mg total) by mouth every morning. 90 tablet 1   Plecanatide (TRULANCE) 3 MG TABS Take 3 mg by mouth daily as needed (constipation).     Polyethyl Glycol-Propyl Glycol (SYSTANE OP) Place 1 drop into both eyes daily as needed (dry eyes).     polyethylene glycol powder (GLYCOLAX/MIRALAX) 17 GM/SCOOP powder Take 1 Container by mouth once.     PRESCRIPTION MEDICATION Inhale into the lungs at bedtime. CPAP     Respiratory Therapy Supplies (NEBULIZER MASK ADULT) MISC 1 kit by Does not apply route as directed. 1 each 1   rivaroxaban (XARELTO) 20 MG TABS tablet Take 1 tablet (20 mg total) by mouth daily with supper. 90 tablet 1   tolterodine (DETROL LA) 4 MG 24 hr capsule Take 4 mg by mouth at bedtime.     triamcinolone (NASACORT) 55 MCG/ACT AERO nasal inhaler Place 2 sprays into the nose daily. 50.7 mL 1   Facility-Administered Medications Prior to Visit  Medication Dose Route Frequency  Provider Last Rate Last Admin   tezepelumab-ekko (TEZSPIRE) 210 MG/1. syringe 210 mg  210 mg Subcutaneous Q28 days Jessica Priest, MD   210 mg at 06/04/23 1009     Per HPI unless specifically indicated in ROS section below Review of Systems  Constitutional:  Negative for fatigue and fever.  HENT:  Negative for ear pain.   Eyes:  Negative for pain.  Respiratory:  Negative  for chest tightness and shortness of breath.   Cardiovascular:  Negative for chest pain, palpitations and leg swelling.  Gastrointestinal:  Negative for abdominal pain.  Genitourinary:  Negative for dysuria.   Objective:  BP 128/80   Pulse (!) 54   Temp 97.7 F (36.5 C)   Ht 5' 3.75" (1.619 m)   Wt 193 lb (87.5 kg)   SpO2 98%   BMI 33.39 kg/m   Wt Readings from Last 3 Encounters:  06/27/23 193 lb (87.5 kg)  06/24/23 187 lb 4 oz (84.9 kg)  06/12/23 187 lb 4 oz (84.9 kg)      Physical Exam Constitutional:      General: She is not in acute distress.    Appearance: Normal appearance. She is well-developed. She is not ill-appearing or toxic-appearing.  HENT:     Head: Normocephalic.     Right Ear: Hearing, tympanic membrane, ear canal and external ear normal. Tympanic membrane is not erythematous, retracted or bulging.     Left Ear: Hearing, tympanic membrane, ear canal and external ear normal. Tympanic membrane is not erythematous, retracted or bulging.     Nose: No mucosal edema or rhinorrhea.     Right Sinus: No maxillary sinus tenderness or frontal sinus tenderness.     Left Sinus: No maxillary sinus tenderness or frontal sinus tenderness.     Mouth/Throat:     Pharynx: Uvula midline.  Eyes:     General: Lids are normal. Lids are everted, no foreign bodies appreciated.     Conjunctiva/sclera: Conjunctivae normal.     Pupils: Pupils are equal, round, and reactive to light.  Neck:     Thyroid: No thyroid mass or thyromegaly.     Vascular: No carotid bruit.     Trachea: Trachea normal.   Cardiovascular:     Rate and Rhythm: Normal rate and regular rhythm.     Pulses: Normal pulses.     Heart sounds: Normal heart sounds, S1 normal and S2 normal. No murmur heard.    No friction rub. No gallop.  Pulmonary:     Effort: Pulmonary effort is normal. No tachypnea or respiratory distress.     Breath sounds: Normal breath sounds. No decreased breath sounds, wheezing, rhonchi or rales.  Abdominal:     General: Bowel sounds are normal.     Palpations: Abdomen is soft.     Tenderness: There is no abdominal tenderness.  Musculoskeletal:     Cervical back: Normal range of motion and neck supple.  Skin:    General: Skin is warm and dry.     Findings: No rash.  Neurological:     Mental Status: She is alert.  Psychiatric:        Mood and Affect: Mood is not anxious or depressed.        Speech: Speech normal.        Behavior: Behavior normal. Behavior is cooperative.        Thought Content: Thought content normal.        Judgment: Judgment normal.       Results for orders placed or performed in visit on 06/12/23  POCT Wet Prep Mellody Drown [place])   Collection Time: 06/12/23 11:21 AM  Result Value Ref Range   Source Wet Prep POC vaginal    WBC, Wet Prep HPF POC few    Bacteria Wet Prep HPF POC Many (A) Few   BACTERIA WET PREP MORPHOLOGY POC     Clue Cells Wet Prep HPF POC  Few (A) None   Clue Cells Wet Prep Whiff POC Negative Whiff    Yeast Wet Prep HPF POC Few (A) None   KOH Wet Prep POC Few (A) None   Trichomonas Wet Prep HPF POC Absent Absent   *Note: Due to a large number of results and/or encounters for the requested time period, some results have not been displayed. A complete set of results can be found in Results Review.    Assessment and Plan  There are no diagnoses linked to this encounter.  No follow-ups on file.   Kerby Nora, MD

## 2023-06-27 NOTE — Progress Notes (Signed)
Subjective:  Patient ID: [name], female    DOB: 03/21/1946,  MRN: [id number]  [name] presents to clinic today for at risk foot care with history of diabetic neuropathy and callus(es) left foot, porokeratotic lesion(s) b/l feet, and painful mycotic nails. Painful toenails interfere with ambulation. Aggravating factors include wearing enclosed shoe gear. Pain is relieved with periodic professional debridement. Painful callus(es) and porokeratotic lesion(s) are aggravated when weightbearing with and without shoegear. Pain is relieved with periodic professional debridement.   Patient saw Dr. Al Corpus a couple of months ago for f/u. She states she had used a razor to trim her callus and cut one of her toes. She was seen in ED to assist with bleeding of digit. States her daughter has had her remove all razor blades. Chief Complaint  Patient presents with   Nail Problem    Pt is here for [facility], last A1C was 5.6 PCP is Dr Ermalene Searing and LOV was in October.   PCP is Excell Seltzer, MD.  Allergies  Allergen Reactions   Dilantin [Phenytoin] Swelling    facial swelling   Latex Hives   Oxycodone Nausea And Vomiting and Nausea Only    Abdominal Pain, Vomiting   Penicillins Nausea And Vomiting and Swelling    Has patient had a PCN reaction causing immediate rash, facial/tongue/throat swelling, SOB or lightheadedness with hypotension patient had a PCN reaction causing severe rash involving mucus membranes or skin necrosis: [id number] Has patient had a PCN reaction that required hospitalization/No Has patient had a PCN reaction occurring within the last 10 years: No If all of the above answers are "NO", then may proceed with Cephalosporin use.      Codeine Nausea Only    Tolerates tylenol with codeine   Hydrocodone Nausea And Vomiting   Nickel Hives and Rash   Pregabalin Itching and Other (See Comments)    headaches/problem w/vision    Review of Systems: Negative except as noted in  the HPI.  Objective: No changes noted in today's physical examination. There were no vitals filed for this visit. [name] is a pleasant 77 y.o. female in NAD. AAO x 3.  Vascular Examination: Capillary refill time immediate b/l. Vascular status intact b/l with palpable pedal pulses. Pedal hair sparse. No edema. No pain with calf compression b/l. Skin temperature gradient WNL b/l. No cyanosis or clubbing b/l. No ischemia or gangrene noted b/l LE.  Neurological Examination: Sensation grossly intact b/l with 10 gram monofilament. Vibratory sensation intact b/l. Pt has subjective symptoms of neuropathy.  Dermatological Examination: Pedal skin with normal turgor, texture and tone b/l.  No open wounds. No interdigital macerations.   Toenails 2-5 b/l thick, discolored, elongated with subungual debris and pain on dorsal palpation.   Anonychia noted bilateral great toes. Nailbed(s) epithelialized.  Hyperkeratotic lesion(s) distal tip of left great toe.  No erythema, no edema, no drainage, no fluctuance.   Porokeratotic lesion(s) submet head 5 b/l and sub 5th met base right lower extremity. No erythema, no edema, no drainage, no fluctuance.  Musculoskeletal Examination: Muscle strength 5/5 to all lower extremity muscle groups bilaterally. Plantarflexed metatarsal(s) 5th metatarsal head b/l lower extremities.  Radiographs: None  Assessment/Plan: 1. Pain due to onychomycosis of toenail   2. Porokeratosis   3. Callus   4. Diabetic polyneuropathy associated with type 2 diabetes mellitus (HCC)     -Consent given for treatment as described below: -Examined patient. -Patient/POA educated on dangers of using sharp instrumentation on toes/feet. Recommended  continued professional foot care in presence of diabetes and neuropathy. Patient/POA relates understanding. -Continue supportive shoe gear daily. -Mycotic toenails 2-5 bilaterally were debrided in length and girth with sterile nail nippers  and dremel without iatrogenic bleeding. -Callus(es) L hallux pared utilizing sterile scalpel blade without complication or incident. Total number debrided =1. -Porokeratotic lesion(s) submet head 5 b/l and sub 5th met base right foot pared and enucleated with sterile currette without incident. Total number of lesions debrided=3. -Patient/POA to call should there be question/concern in the interim.   Return in about 3 months (around 09/22/2023).  Freddie Breech, DPM      [place] LOCATION: [address] [place] [address]                   Office [phone]   [facility] LOCATION: [address] [place], [place] [address] Office [phone]

## 2023-07-02 ENCOUNTER — Ambulatory Visit: Payer: Medicare Other

## 2023-07-02 ENCOUNTER — Encounter: Payer: Self-pay | Admitting: Family Medicine

## 2023-07-02 DIAGNOSIS — Z853 Personal history of malignant neoplasm of breast: Secondary | ICD-10-CM | POA: Diagnosis not present

## 2023-07-02 LAB — HM MAMMOGRAPHY

## 2023-07-03 ENCOUNTER — Telehealth: Payer: Self-pay | Admitting: Allergy and Immunology

## 2023-07-03 ENCOUNTER — Ambulatory Visit: Payer: Medicare Other | Admitting: *Deleted

## 2023-07-03 DIAGNOSIS — J4550 Severe persistent asthma, uncomplicated: Secondary | ICD-10-CM

## 2023-07-03 NOTE — Telephone Encounter (Signed)
[name] came into the office and is requesting an explanation of why she was switched from Xolair to [place]? [name] states while on [place], she is wheezing more and is short of breath.  [name] states she would like Dr. Lucie Leather to please call her and discuss this medication to her.

## 2023-07-04 ENCOUNTER — Encounter (HOSPITAL_BASED_OUTPATIENT_CLINIC_OR_DEPARTMENT_OTHER): Payer: Self-pay | Admitting: Emergency Medicine

## 2023-07-04 ENCOUNTER — Other Ambulatory Visit: Payer: Self-pay

## 2023-07-04 ENCOUNTER — Emergency Department (HOSPITAL_BASED_OUTPATIENT_CLINIC_OR_DEPARTMENT_OTHER)
Admission: EM | Admit: 2023-07-04 | Discharge: 2023-07-04 | Disposition: A | Discharge status: Home / Self Care | Payer: Medicare Other

## 2023-07-04 ENCOUNTER — Encounter (HOSPITAL_COMMUNITY): Payer: Self-pay | Admitting: *Deleted

## 2023-07-04 ENCOUNTER — Ambulatory Visit (HOSPITAL_COMMUNITY)
Admission: EM | Admit: 2023-07-04 | Discharge: 2023-07-04 | Disposition: A | Discharge status: Home / Self Care | Payer: Medicare Other

## 2023-07-04 ENCOUNTER — Telehealth: Payer: Self-pay | Admitting: *Deleted

## 2023-07-04 DIAGNOSIS — Z91040 Latex allergy status: Secondary | ICD-10-CM | POA: Insufficient documentation

## 2023-07-04 DIAGNOSIS — Z853 Personal history of malignant neoplasm of breast: Secondary | ICD-10-CM | POA: Insufficient documentation

## 2023-07-04 DIAGNOSIS — R5383 Other fatigue: Secondary | ICD-10-CM | POA: Insufficient documentation

## 2023-07-04 DIAGNOSIS — R9431 Abnormal electrocardiogram [ECG] [EKG]: Secondary | ICD-10-CM | POA: Diagnosis not present

## 2023-07-04 DIAGNOSIS — Z7901 Long term (current) use of anticoagulants: Secondary | ICD-10-CM | POA: Insufficient documentation

## 2023-07-04 LAB — CBC
HCT: 35.1 % — ABNORMAL LOW (ref 36.0–46.0)
Hemoglobin: 11.6 g/dL — ABNORMAL LOW (ref 12.0–15.0)
MCH: 24.3 pg — ABNORMAL LOW (ref 26.0–34.0)
MCHC: 33 g/dL (ref 30.0–36.0)
MCV: 73.4 fL — ABNORMAL LOW (ref 80.0–100.0)
Platelets: 244 10*3/uL (ref 150–400)
RBC: 4.78 MIL/uL (ref 3.87–5.11)
RDW: 19.1 % — ABNORMAL HIGH (ref 11.5–15.5)
WBC: 6.7 10*3/uL (ref 4.0–10.5)
nRBC: 0 % (ref 0.0–0.2)

## 2023-07-04 LAB — URINALYSIS, ROUTINE W REFLEX MICROSCOPIC
Bacteria, UA: NONE SEEN
Bilirubin Urine: NEGATIVE
Glucose, UA: >1000 mg/dL — AB
Ketones, ur: NEGATIVE mg/dL
Leukocytes,Ua: NEGATIVE
Nitrite: NEGATIVE
Protein, ur: NEGATIVE mg/dL
Specific Gravity, Urine: <1.005 — ABNORMAL LOW (ref 1.005–1.030)
pH: 6.5 (ref 5.0–8.0)

## 2023-07-04 LAB — BASIC METABOLIC PANEL
Anion gap: 7 (ref 5–15)
BUN: 12 mg/dL (ref 8–23)
CO2: 28 mmol/L (ref 22–32)
Calcium: 9.3 mg/dL (ref 8.9–10.3)
Chloride: 104 mmol/L (ref 98–111)
Creatinine, Ser: 0.96 mg/dL (ref 0.44–1.00)
GFR, Estimated: >60 mL/min (ref >60)
Glucose, Bld: 90 mg/dL (ref 70–99)
Potassium: 4.5 mmol/L (ref 3.5–5.1)
Sodium: 139 mmol/L (ref 135–145)

## 2023-07-04 LAB — CBG MONITORING, ED: Glucose-Capillary: 93 mg/dL (ref 70–99)

## 2023-07-04 NOTE — Telephone Encounter (Deleted)
Copied from CRM [id number]. Topic: Clinical - Medical Advice >> Jul 04, 2023 11:50 AM Adele Barthel wrote: Reason for CRM: Pt has been experiencing excessive fatigue that began on 07/03/2023. She reports falling asleep while getting her hair done this morning. She requests call back from Dr. Daphine Deutscher nurse to discuss her symptoms. CB# [phone]

## 2023-07-04 NOTE — Discharge Instructions (Addendum)
As discussed, your labs are reassuring. There are no indications of stroke.  Make sure you are getting enough sleep and that you are staying hydrated.  Follow-up with your primary care provider in the next 2 days for reevaluation of your symptoms.  Get help right away if: You feel confused, feel like you might faint, or faint. Your vision is blurry or you have a severe headache. You have severe pain in your abdomen, your back, or the area between your waist and hips (pelvis). You have chest pain, shortness of breath, or an irregular or fast heartbeat. You are unable to urinate, or you urinate less than normal. You have abnormal bleeding from the rectum, nose, lungs, nipples, or, if you are female, the vagina. You vomit blood. You have thoughts about hurting yourself or others.

## 2023-07-04 NOTE — ED Provider Notes (Signed)
[place] [facility] DE[facility]MENT AT [facility] Provider Note   CSN: [id number] Arrival date & time: 07/04/23  1548     History  Chief Complaint  Patient presents with   Fatigue    [name] is a 77 y.o. female with a history of breast cancer, pulmonary hypertension, seizures, and pulmonary embolism presents the ED today for fatigue.  Patient was at her hairdresser's earlier today and was told that her speech was slurred and that her mouth looked funny.  Patient states that she took a lozenge, which she has for dry mouth, and drank some water which caused the symptoms to resolve.  She states that she felt tired so she went home and took a nap and is now feeling better.  She called her primary care's office and the triage nurse told her to go to urgent care or the ED for further evaluation.  Patient went to urgent care but was sent here for evaluation.   Patient states that she feels fatigued but fine overall.  She denies any vision changes, dizziness, weakness, or inability to ambulate independently.  No recent head injury or trauma.  Her friend is at bedside and states that her speech is not slurred at this time.  Patient states that she slept well last night but did not sleep much the night before and might just be tired.  She takes Xarelto daily for atrial fibrillation and has not skipped any doses recently.  No additional complaints or concerns at this time.    Home Medications Prior to Admission medications   Medication Sig Start Date End Date Taking? Authorizing Provider  ADVAIR HFA 230-21 MCG/ACT inhaler Inhale 2 puffs into the lungs 2 (two) times daily. 2 puffs 1-2 times daily depending on disease activity. 05/28/23   Kozlow, Alvira Philips, MD  albuterol (PROVENTIL) (2.5 MG/3ML) 0.083% nebulizer solution Take 3 mLs (2.5 mg total) by nebulization every 6 (six) hours as needed for wheezing or shortness of breath. 02/12/23   Kozlow, Alvira Philips, MD  albuterol (VENTOLIN HFA) 108 (90  Base) MCG/ACT inhaler INHALE 2 PUFFS BY MOUTH EVERY 6 HOURS AS NEEDED FOR WHEEZING OR SHORTNESS OF BREATH 02/12/23   Kozlow, Alvira Philips, MD  anastrozole (ARIMIDEX) 1 MG tablet TAKE 1 TABLET(1 MG) BY MOUTH DAILY 12/12/22   Serena Croissant, MD  atorvastatin (LIPITOR) 10 MG tablet TAKE 1 TABLET(10 MG) BY MOUTH DAILY 06/03/22   Bedsole, Amy E, MD  Blood Glucose Monitoring Suppl (ONETOUCH VERIO) w/Device KIT Use to check blood sugar up to 2 times a day 08/16/20   Ermalene Searing, Amy E, MD  cyclobenzaprine (FLEXERIL) 10 MG tablet Take 0.5-1 tablets (5-10 mg total) by mouth at bedtime as needed for muscle spasms. 03/09/22   Bedsole, Amy E, MD  desloratadine (CLARINEX) 5 MG tablet TAKE 1 TABLET(5 MG) BY MOUTH DAILY 02/04/23   Kozlow, Alvira Philips, MD  EPINEPHrine 0.3 mg/0.3 mL IJ SOAJ injection USE AS DIRECTED FOR LIFE THREATENING ALLERGIC REACTIONS 05/28/23   Kozlow, Alvira Philips, MD  estradiol (ESTRACE) 0.1 MG/GM vaginal cream Place 1 Applicatorful vaginally 2 (two) times a week. (As Needed)    [provider]  famotidine (PEPCID) 40 MG tablet Take 1 tablet (40 mg total) by mouth at bedtime. Take 1 (ONE) tablet by mouth every evening. 05/28/23   Kozlow, Alvira Philips, MD  fluconazole (DIFLUCAN) 150 MG tablet Take one pill by mouth now and repeat dose in 3 days 06/12/23   Tower, Audrie Gallus, MD  fluticasone (FLOVENT  HFA) 220 MCG/ACT inhaler Inhale 2 puffs into the lungs 2 (two) times daily. 2 puffs 2 times daily during flare up. 02/12/23   Kozlow, Alvira Philips, MD  gabapentin (NEURONTIN) 300 MG capsule Take one capsule in the morning, one at lunch and two at bedtime 09/05/22   Hyatt, Max T, DPM  glucose blood (ONETOUCH VERIO) test strip 1 strip 2 (two) times daily 01/29/22   [provider]  GLYXAMBI 10-5 MG TABS TAKE 1 TABLET BY MOUTH DAILY 12/03/22   Bedsole, Amy E, MD  hydrOXYzine (ATARAX) 10 MG tablet Take 1 tablet (10 mg total) by mouth daily as needed for itching. 05/03/23   Bedsole, Amy E, MD  ipratropium (ATROVENT) 0.06 % nasal spray  Place 2 sprays into both nostrils 2 (two) times daily as needed for rhinitis. 05/28/23   Kozlow, Alvira Philips, MD  irbesartan (AVAPRO) 150 MG tablet TAKE 1 TABLET(150 MG) BY MOUTH DAILY 04/17/23   Croitoru, Mihai, MD  Lactase (DAIRY-RELIEF PO) Take 1 capsule by mouth daily as needed (eating dairy).    [provider]  Lancets (ONETOUCH DELICA PLUS LANCET33G) MISC USE TO CHECK BLOOD SUGAR UP TO TWICE DAILY 01/29/22   Bedsole, Amy E, MD  levalbuterol (XOPENEX) 1.25 MG/3ML nebulizer solution USE 1 VIAL VIA NEBULIZER EVERY 6 HOURS AS NEEDED FOR SHORTNESS OF BREATH OR WHEEZING 09/10/22   Kozlow, Alvira Philips, MD  Magnesium 500 MG TABS Take 500 mg by mouth 2 (two) times daily.    [provider]  Menthol, Topical Analgesic, (ABSORBINE PLUS JR EX) Apply 1 patch topically daily as needed (pain).    [provider]  Menthol-Methyl Salicylate (SALONPAS JET SPRAY EX) Apply 1 spray topically daily as needed (knee pain).    [provider]  methimazole (TAPAZOLE) 5 MG tablet Take 5 mg by mouth daily. 04/15/23   [provider]  montelukast (SINGULAIR) 10 MG tablet Take 1 tablet (10 mg total) by mouth at bedtime. For asthma control. 05/28/23   Kozlow, Alvira Philips, MD  Mouthwashes (BIOTENE DRY MOUTH MT) Use as directed 1 Dose in the mouth or throat daily as needed (dry mouth).    [provider]  Nebulizer MISC 1 Device by Does not apply route as directed. 02/12/23   Kozlow, Alvira Philips, MD  omeprazole (PRILOSEC) 40 MG capsule Take 40 mg by mouth every morning. 02/28/23   [provider]  ondansetron (ZOFRAN-ODT) 4 MG disintegrating tablet Take 1 tablet (4 mg total) by mouth every 8 (eight) hours as needed for nausea or vomiting. 02/20/23   Bedsole, Amy E, MD  oxybutynin (DITROPAN-XL) 10 MG 24 hr tablet Take 10 mg by mouth daily. 02/19/23   [provider]  pantoprazole (PROTONIX) 40 MG tablet Take 1 tablet (40 mg total) by mouth every morning. 05/28/23   Kozlow, Alvira Philips, MD   Plecanatide (TRULANCE) 3 MG TABS Take 3 mg by mouth daily as needed (constipation).    [provider]  Polyethyl Glycol-Propyl Glycol (SYSTANE OP) Place 1 drop into both eyes daily as needed (dry eyes).    [provider]  polyethylene glycol powder (GLYCOLAX/MIRALAX) 17 GM/SCOOP powder Take 1 Container by mouth once.    [provider]  PRESCRIPTION MEDICATION Inhale into the lungs at bedtime. CPAP    [provider]  Respiratory Therapy Supplies (NEBULIZER MASK ADULT) MISC 1 kit by Does not apply route as directed. 01/15/23   Kozlow, Alvira Philips, MD  rivaroxaban (XARELTO) 20 MG TABS tablet Take  1 tablet (20 mg total) by mouth daily with supper. 04/16/23   Croitoru, Mihai, MD  tolterodine (DETROL LA) 4 MG 24 hr capsule Take 4 mg by mouth at bedtime.    Alfredo Martinez, MD  triamcinolone (NASACORT) 55 MCG/ACT AERO nasal inhaler Place 2 sprays into the nose daily. 05/28/23   Kozlow, Alvira Philips, MD      Allergies    Dilantin [phenytoin], Latex, Oxycodone, Penicillins, Codeine, Hydrocodone, Nickel, and Pregabalin    Review of Systems   Review of Systems  Constitutional:  Positive for fatigue.  All other systems reviewed and are negative.   Physical Exam Updated Vital Signs BP (!) 109/96 (BP Location: Right Arm)   Pulse 73   Temp 98.1 F (36.7 C)   Resp 17   SpO2 99%  Physical Exam Vitals and nursing note reviewed.  Constitutional:      General: She is not in acute distress.    Appearance: Normal appearance.  HENT:     Head: Normocephalic and atraumatic.     Mouth/Throat:     Mouth: Mucous membranes are moist.  Eyes:     Extraocular Movements: Extraocular movements intact.     Conjunctiva/sclera: Conjunctivae normal.     Pupils: Pupils are equal, round, and reactive to light.  Cardiovascular:     Rate and Rhythm: Normal rate and regular rhythm.     Pulses: Normal pulses.     Heart sounds: Normal heart sounds.  Pulmonary:     Effort: Pulmonary  effort is normal.     Breath sounds: Normal breath sounds.  Abdominal:     Palpations: Abdomen is soft.     Tenderness: There is no abdominal tenderness.  Musculoskeletal:        General: Normal range of motion.     Cervical back: Normal range of motion and neck supple.     Comments: Range of motion, sensation, and strength of upper and lower extremities intact bilaterally.  Skin:    General: Skin is warm and dry.     Findings: No rash.  Neurological:     General: No focal deficit present.     Mental Status: She is alert and oriented to person, place, and time.     Sensory: No sensory deficit.     Motor: No weakness.     Gait: Gait normal.     Comments: No slurred speech or facial droop. Normal nose to finger touch test. Patient is able to ambulate independently. No focal neurologic deficits appreciated.  Psychiatric:        Mood and Affect: Mood normal.        Behavior: Behavior normal.    ED Results / Procedures / Treatments   Labs (all labs ordered are listed, but only abnormal results are displayed) Labs Reviewed  CBC - Abnormal; Notable for the following components:      Result Value   Hemoglobin 11.6 (*)    HCT 35.1 (*)    MCV 73.4 (*)    MCH 24.3 (*)    RDW 19.1 (*)    All other components within normal limits  URINALYSIS, ROUTINE W REFLEX MICROSCOPIC - Abnormal; Notable for the following components:   Color, Urine COLORLESS (*)    Specific Gravity, Urine <1.005 (*)    Glucose, UA >1,000 (*)    Hgb urine dipstick TRACE (*)    All other components within normal limits  BASIC METABOLIC PANEL  CBG MONITORING, ED  CBG MONITORING, ED  EKG EKG Interpretation Date/Time:  Thursday July 04 2023 16:18:09 EST Ventricular Rate:  107 PR Interval:    QRS Duration:  87 QT Interval:  335 QTC Calculation: 408 R Axis:   -86  Text Interpretation: Atrial flutter with predominant 3:1 AV block Left anterior fascicular block Anterior infarct, old Confirmed by Estanislado Pandy [phone]) on 07/04/2023 4:47:35 PM  Radiology No results found.  Procedures Procedures: not indicated.   Medications Ordered in ED Medications - No data to display  ED Course/ Medical Decision Making/ A&P                                 Medical Decision Making Amount and/or Complexity of Data Reviewed Labs: ordered.   This patient presents to the ED for concern of fatigue, this involves an extensive number of treatment options, and is a complaint that carries with it a high risk of complications and morbidity.   Differential diagnosis includes: Dehydration, UTI, electrolyte derangement, hypoglycemia, stroke, etc. Low suspicion for stroke.  NIH score of 0.  No focal neural deficits, weakness, vision changes, slurred speech, or facial droop.  She is alert and oriented x 4.   Comorbidities  See HPI above   Additional History  Additional history obtained from prior records.   Cardiac Monitoring / EKG  The patient was maintained on a cardiac monitor.  I personally viewed and interpreted the cardiac monitored which showed: atrial flutter with a heart rate of 107 bpm.   Lab Tests  I ordered and personally interpreted labs.  The pertinent results include:   BMP and CBC are within normal limits - no acute electrolyte derangement, AKI, infection, or anemia.   Problem List / ED Course / Critical Interventions / Medication Management  Fatigue NIH score of 0 I discussed patient case with my attending, Dr. Maple Hudson, who evaluated patient as well. We both agreed that there's a low suspicion for stroke based off of the nature of the symptoms and how they improved with drinking water and a lozenge. Patient states symptoms improved prior to arrival.  No medications given at this time. I have reviewed the patients home medicines and have made adjustments as needed   Social Determinants of Health  Physical activity   Test / Admission - Considered  Discussed findings with  patient and family member at bedside. She is hemodynamically stable and safe for discharge home. Instructed close follow-up with primary care. Strict return precautions given.       Final Clinical Impression(s) / ED Diagnoses Final diagnoses:  Fatigue, unspecified type    Rx / DC Orders ED Discharge Orders     None         Maxwell Marion, PA-C 07/04/23 2345    Coral Spikes, DO 07/05/23 1457

## 2023-07-04 NOTE — ED Notes (Signed)
Pt aware of the need for a urine... Unable to currently provide the sample... 

## 2023-07-04 NOTE — ED Triage Notes (Signed)
PT reports she was at the Hair dresser this morning and her mouth was dry. The person doing her hair asked her why her mouth look funny. Pt took a a lozenges for dry mouth. Pt reports feeling tired and sleepy. NIH is 0.

## 2023-07-04 NOTE — Telephone Encounter (Signed)
Please triage

## 2023-07-04 NOTE — ED Notes (Signed)
Provider at bed side to eval PT 

## 2023-07-04 NOTE — Telephone Encounter (Signed)
Noted.. urgent care probably more appropriate then ED, but pt has already sate she is going to ED.

## 2023-07-04 NOTE — Telephone Encounter (Signed)
Copied from CRM [id number]. Topic: Clinical - Medical Advice >> Jul 04, 2023 11:50 AM [name] wrote: Reason for CRM: Pt has been experiencing excessive fatigue that began on 07/03/2023. She reports falling asleep while getting her hair done this morning. She requests call back from Dr. Daphine Deutscher nurse to discuss her symptoms. CB# [phone]

## 2023-07-04 NOTE — ED Triage Notes (Signed)
Feeling more tired today. Getting hair done today and wa told her speech was slurred and mouth looked funny. NIH 0 in triage, symptoms resolved PTA. Was seen at [facility] and sent for eval

## 2023-07-04 NOTE — Telephone Encounter (Signed)
L/m for patient to contact me to discuss change

## 2023-07-04 NOTE — Telephone Encounter (Signed)
I spoke with pt; pt said she was very sleepy right now; pt was not sure why she was so sleepy. Pt seems slow to answer or respond to questions but pt said its because she is so sleepy.no difficulty in walking and no weakness but pt said she is dropping things a lot. Pt said started in last day or so. No available appts at [facility] or [facility] and pt said she is going to ED on [address].sending note to Dr Ermalene Searing and Ermalene Searing ool.

## 2023-07-05 NOTE — Telephone Encounter (Signed)
Spoke to patient and she wants to stay on [place] for now. Wanted to know the difference in therapies and I explained

## 2023-07-08 ENCOUNTER — Other Ambulatory Visit: Payer: Self-pay | Admitting: Family Medicine

## 2023-07-09 NOTE — Assessment & Plan Note (Signed)
Acute, today she reports her vaginal irritation is almost 100% resolved following course of fluconazole, using Desitin and using depends to absorb urinary incontinence. She will continue to follow and let me know if symptoms do not resolve completely.

## 2023-07-11 ENCOUNTER — Ambulatory Visit (INDEPENDENT_AMBULATORY_CARE_PROVIDER_SITE_OTHER): Payer: Medicare Other | Admitting: Family Medicine

## 2023-07-11 ENCOUNTER — Encounter: Payer: Self-pay | Admitting: Family Medicine

## 2023-07-11 VITALS — BP 134/86 | HR 55 | Temp 97.7°F | Ht 63.75 in | Wt 192.0 lb

## 2023-07-11 DIAGNOSIS — E1159 Type 2 diabetes mellitus with other circulatory complications: Secondary | ICD-10-CM

## 2023-07-11 DIAGNOSIS — E1340 Other specified diabetes mellitus with diabetic neuropathy, unspecified: Secondary | ICD-10-CM

## 2023-07-11 DIAGNOSIS — I152 Hypertension secondary to endocrine disorders: Secondary | ICD-10-CM | POA: Diagnosis not present

## 2023-07-11 DIAGNOSIS — R5383 Other fatigue: Secondary | ICD-10-CM

## 2023-07-11 DIAGNOSIS — Z7984 Long term (current) use of oral hypoglycemic drugs: Secondary | ICD-10-CM | POA: Diagnosis not present

## 2023-07-11 DIAGNOSIS — R682 Dry mouth, unspecified: Secondary | ICD-10-CM | POA: Diagnosis not present

## 2023-07-11 NOTE — Assessment & Plan Note (Addendum)
Did cut her gabapentin dose from podiatry   Wonder if previous dose made her sleepy   Normal pedal pulses today

## 2023-07-11 NOTE — Progress Notes (Signed)
Subjective:    Patient ID: [name], female    DOB: 05-04-1946, 77 y.o.   MRN: [id number]  HPI  Wt Readings from Last 3 Encounters:  07/11/23 192 lb (87.1 kg)  06/27/23 193 lb (87.5 kg)  06/24/23 187 lb 4 oz (84.9 kg)   33.22 kg/m  Vitals:   07/11/23 1057  BP: 134/86  Pulse: (!) 55  Temp: 97.7 F (36.5 C)  SpO2: 98%    77 yo pt of Dr Ermalene Searing presents for follow up from ER visit for fatigue  She was prompted to go to ER on 12/19 after noting slurred speech at hairdresser that resolved after she sipped some water  Felt very tired and went home to take a nap (noting she did not sleep the night before)  Pt states she gets a very dry mouth sometimes and it makes her mouth look wrong She notes she felt sleepy at the hair dresser   Takes xarelto daily for a fib  Compliant with this   EKG noted a flutter with 3:1 AV block / and old infarct  No changes when monitored   Lab Results  Component Value Date   NA 139 07/04/2023   K 4.5 07/04/2023   CO2 28 07/04/2023   GLUCOSE 90 07/04/2023   BUN 12 07/04/2023   CREATININE 0.96 07/04/2023   CALCIUM 9.3 07/04/2023   GFR 53.35 (L) 04/26/2023   EGFR 53 (L) 11/21/2020   GFRNONAA >60 07/04/2023   Lab Results  Component Value Date   WBC 6.7 07/04/2023   HGB 11.6 (L) 07/04/2023   HCT 35.1 (L) 07/04/2023   MCV 73.4 (L) 07/04/2023   PLT 244 07/04/2023   UA clear except for glucose   Per ER note  Problem List / ED Course / Critical Interventions / Medication Management   Fatigue NIH score of 0 I discussed patient case with my attending, Dr. Maple Hudson, who evaluated patient as well. We both agreed that there's a low suspicion for stroke based off of the nature of the symptoms and how they improved with drinking water and a lozenge. Patient states symptoms improved prior to arrival.  No medications given at this time. I have reviewed the patients home medicines and have made adjustments as needed  Has HTN bp is stable  today  No cp or palpitations or headaches or edema  No side effects to medicines  BP Readings from Last 3 Encounters:  07/11/23 134/86  07/04/23 (!) 109/96  07/04/23 (!) 160/117    Ibesartan 150 mg daily   Diabetes Lab Results  Component Value Date   HGBA1C 5.8 04/26/2023    Sees endo for hyperthyroidism-takes methimazole   Singulair on med list for allergies  Also clarinex  Gabapentin  300 mg one in am , one mid day and 2 at bedtime   Feels back to normal now   Overactive bladder  Detrol LA 4 mg  Ditropan xl 10     Patient Active Problem List   Diagnosis Date Noted   Vaginal discomfort 06/12/2023   Traumatic hematoma of right knee 05/03/2023   Urinary frequency 05/03/2023   Pruritus 09/04/2022   SSS (sick sinus syndrome) (HCC) 04/26/2022   Family history of breast cancer 02/28/2022   Family history of colon cancer in mother 02/28/2022   Ductal carcinoma in situ (DCIS) of left breast 02/23/2022   Family history of malignant neoplasm of digestive organs 02/02/2022   Anemia 12/29/2021   Chronic constipation 12/29/2021  Chronic anticoagulation - Xerelto 12/18/2021   Hyperthyroidism 12/18/2021   Status post total left knee replacement 12/09/2021   Presence of right artificial knee joint 10/30/2021   Constipation by delayed colonic transit 09/25/2021   Atypical atrial flutter (HCC) 06/24/2021   Chronic diastolic heart failure (HCC) 06/24/2021   Stenosing tenosynovitis of finger of right hand 09/06/2020   Type 2 diabetes mellitus with diabetic neuropathy, unspecified (HCC) 06/20/2020   Acute pain of left wrist 06/03/2020   Dysphonia 04/08/2020   Vaginal irritation 02/16/2020   Discitis of lumbosacral region 01/04/2020   Hyperlipidemia associated with type 2 diabetes mellitus (HCC) 05/07/2019   Obstructive sleep apnea treated with continuous positive airway pressure (CPAP) 06/03/2018   Left thyroid nodule 02/06/2018   Insomnia secondary to chronic pain  01/26/2018   Class 3 drug-induced obesity with serious comorbidity and body mass index (BMI) of 40.0 to 44.9 in adult (HCC) 12/03/2017   Graves disease 10/15/2017   Acute diarrhea 01/04/2017   Dry mouth 11/01/2016   Bilateral high frequency sensorineural hearing loss 10/24/2016   Subjective tinnitus, bilateral 10/24/2016   Other fatigue 12/09/2015   Moderate persistent asthma 07/12/2015   Paroxysmal atrial fibrillation (HCC) 06/03/2015   Neuropathy due to secondary diabetes (HCC) 01/11/2015   Migraine headache without aura 08/24/2014   Vaginal candida 06/07/2014   Allergic rhinitis 03/25/2014   DVT (deep venous thrombosis) hx of  03/25/2014   Osteoarthritis of right knee 03/25/2014    ? of Seizure disorder 03/25/2014   Depression, major, in remission (HCC) 03/25/2014   Hypertension associated with diabetes (HCC) 03/25/2014   GERD (gastroesophageal reflux disease) 03/25/2014   History of DVT (deep vein thrombosis) 03/25/2014   History of pulmonary embolism 10/09/2013   Asthma, moderate persistent 01/24/2011   Pulmonary hypertension (HCC) 01/11/2011   Past Medical History:  Diagnosis Date   Allergic rhinitis    Allergy    Arthritis    Asthma    Breast cancer (HCC)    Chronic headache    Diabetes mellitus    Ductal carcinoma in situ (DCIS) of left breast 02/23/2022   DVT (deep venous thrombosis) (HCC)    Dyspnea    Heart murmur    History of radiation therapy    Left Breast 05/02/22-05/30/22- Dr. Antony Blackbird   Hypertension    Penicillin allergy 01/19/2020   Pulmonary embolism (HCC)    Pulmonary hypertension (HCC)    Seizures (HCC)    Past Surgical History:  Procedure Laterality Date   ABDOMINAL HYSTERECTOMY     partial, has ovaries   BREAST LUMPECTOMY WITH RADIOACTIVE SEED LOCALIZATION Left 03/29/2022   Procedure: LEFT BREAST LUMPECTOMY WITH RADIOACTIVE SEED LOCALIZATION;  Surgeon: Harriette Bouillon, MD;  Location: [facility] OR;  Service: General;  Laterality: Left;   CARDIAC  CATHETERIZATION  12/28/2010   Mod. pulmonary hypertension, normal coronary arteries   CARDIOVERSION N/A 11/23/2020   Procedure: CARDIOVERSION;  Surgeon: Thurmon Fair, MD;  Location: [facility];  Service: Cardiovascular;  Laterality: N/A;   CHOLECYSTECTOMY N/A 12/20/2021   Procedure: LAPAROSCOPIC CHOLECYSTECTOMY WITH INTRAOPERATIVE CHOLANGIOGRAM;  Surgeon: Darnell Level, MD;  Location: [facility] ORS;  Service: General;  Laterality: N/A;   DOPPLER ECHOCARDIOGRAPHY  10/08/2011   EF=>55%,mild asymmetric LVH, mod. TR, mod. PH, mild to mod LA dilatation   ESOPHAGEAL MANOMETRY N/A 06/19/2023   Procedure: ESOPHAGEAL MANOMETRY (EM);  Surgeon: Kerin Salen, MD;  Location: [facility];  Service: Gastroenterology;  Laterality: N/A;   IR LUMBAR DISC ASPIRATION W/IMG GUIDE  01/12/2020   KNEE ARTHROSCOPY  Left    KNEE SURGERY     Nuclear Stress Test  05/20/2006   No ischemia   PARTIAL HYSTERECTOMY     PLANTAR FASCIA SURGERY     TONSILLECTOMY     Social History   Tobacco Use   Smoking status: Never    Passive exposure: Never   Smokeless tobacco: Never  Vaping Use   Vaping status: Never Used  Substance Use Topics   Alcohol use: Not Currently    Alcohol/week: 0.0 standard drinks of alcohol    Comment: occ glass on wine   Drug use: No   Family History  Problem Relation Age of Onset   Cancer Mother        breast and colon cancer   Hypertension Mother    Clotting disorder Mother    Breast cancer Mother 96   Arthritis Mother    Stroke Mother    Diabetes Mother    Colon cancer Mother 28 - 75   Alcohol abuse Father    Cancer Sister        breast cancer   Multiple sclerosis Sister    Cancer Sister        colon cancer   Cancer Brother        colon cancer   Prostate cancer Half-Brother    Stomach cancer Half-Brother    Breast cancer Half-Sister 16       recurrence at 71, reports positive genetic testing   Arthritis Half-Sister    Diabetes Half-Sister    Colon cancer Half-Sister 26   Allergies  Other        grandson   Allergic rhinitis Neg Hx    Angioedema Neg Hx    Asthma Neg Hx    Atopy Neg Hx    Eczema Neg Hx    Immunodeficiency Neg Hx    Urticaria Neg Hx    Allergies  Allergen Reactions   Dilantin [Phenytoin] Swelling    facial swelling   Latex Hives   Oxycodone Nausea And Vomiting and Nausea Only    Abdominal Pain, Vomiting   Penicillins Nausea And Vomiting and Swelling    Has patient had a PCN reaction causing immediate rash, facial/tongue/throat swelling, SOB or lightheadedness with hypotension patient had a PCN reaction causing severe rash involving mucus membranes or skin necrosis: [id number] Has patient had a PCN reaction that required hospitalization/No Has patient had a PCN reaction occurring within the last 10 years: No If all of the above answers are "NO", then may proceed with Cephalosporin use.      Codeine Nausea Only    Tolerates tylenol with codeine   Hydrocodone Nausea And Vomiting   Nickel Hives and Rash   Pregabalin Itching and Other (See Comments)    headaches/problem w/vision   Current Outpatient Medications on File Prior to Visit  Medication Sig Dispense Refill   ADVAIR HFA 230-21 MCG/ACT inhaler Inhale 2 puffs into the lungs 2 (two) times daily. 2 puffs 1-2 times daily depending on disease activity. 36 g 1   albuterol (PROVENTIL) (2.5 MG/3ML) 0.083% nebulizer solution Take 3 mLs (2.5 mg total) by nebulization every 6 (six) hours as needed for wheezing or shortness of breath. 150 mL 1   albuterol (VENTOLIN HFA) 108 (90 Base) MCG/ACT inhaler INHALE 2 PUFFS BY MOUTH EVERY 6 HOURS AS NEEDED FOR WHEEZING OR SHORTNESS OF BREATH 18 g 1   anastrozole (ARIMIDEX) 1 MG tablet TAKE 1 TABLET(1 MG) BY MOUTH DAILY 90 tablet 3   atorvastatin (LIPITOR)  10 MG tablet TAKE 1 TABLET(10 MG) BY MOUTH DAILY 90 tablet 3   Blood Glucose Monitoring Suppl (ONETOUCH VERIO) w/Device KIT Use to check blood sugar up to 2 times a day 1 kit 0   cyclobenzaprine (FLEXERIL)  10 MG tablet Take 0.5-1 tablets (5-10 mg total) by mouth at bedtime as needed for muscle spasms. 20 tablet 0   desloratadine (CLARINEX) 5 MG tablet TAKE 1 TABLET(5 MG) BY MOUTH DAILY 90 tablet 1   EPINEPHrine 0.3 mg/0.3 mL IJ SOAJ injection USE AS DIRECTED FOR LIFE THREATENING ALLERGIC REACTIONS 2 each 1   estradiol (ESTRACE) 0.1 MG/GM vaginal cream Place 1 Applicatorful vaginally 2 (two) times a week. (As Needed)     famotidine (PEPCID) 40 MG tablet Take 1 tablet (40 mg total) by mouth at bedtime. Take 1 (ONE) tablet by mouth every evening. 90 tablet 1   fluconazole (DIFLUCAN) 150 MG tablet Take one pill by mouth now and repeat dose in 3 days 2 tablet 0   fluticasone (FLOVENT HFA) 220 MCG/ACT inhaler Inhale 2 puffs into the lungs 2 (two) times daily. 2 puffs 2 times daily during flare up. 36 g 1   gabapentin (NEURONTIN) 300 MG capsule Take one capsule in the morning, one at lunch and two at bedtime 360 capsule 3   glucose blood (ONETOUCH VERIO) test strip 1 strip 2 (two) times daily     GLYXAMBI 10-5 MG TABS TAKE 1 TABLET BY MOUTH DAILY 30 tablet 5   hydrOXYzine (ATARAX) 10 MG tablet Take 1 tablet (10 mg total) by mouth daily as needed for itching. 30 tablet 2   ipratropium (ATROVENT) 0.06 % nasal spray Place 2 sprays into both nostrils 2 (two) times daily as needed for rhinitis. 45 mL 1   irbesartan (AVAPRO) 150 MG tablet TAKE 1 TABLET(150 MG) BY MOUTH DAILY 90 tablet 2   Lactase (DAIRY-RELIEF PO) Take 1 capsule by mouth daily as needed (eating dairy).     Lancets (ONETOUCH DELICA PLUS LANCET33G) MISC USE TO CHECK BLOOD SUGAR UP TO TWICE DAILY 100 each 5   levalbuterol (XOPENEX) 1.25 MG/3ML nebulizer solution USE 1 VIAL VIA NEBULIZER EVERY 6 HOURS AS NEEDED FOR SHORTNESS OF BREATH OR WHEEZING 90 mL 3   Magnesium 500 MG TABS Take 500 mg by mouth 2 (two) times daily.     Menthol, Topical Analgesic, (ABSORBINE PLUS JR EX) Apply 1 patch topically daily as needed (pain).     Menthol-Methyl Salicylate  (SALONPAS JET SPRAY EX) Apply 1 spray topically daily as needed (knee pain).     methimazole (TAPAZOLE) 5 MG tablet Take 5 mg by mouth daily.     montelukast (SINGULAIR) 10 MG tablet Take 1 tablet (10 mg total) by mouth at bedtime. For asthma control. 90 tablet 1   Mouthwashes (BIOTENE DRY MOUTH MT) Use as directed 1 Dose in the mouth or throat daily as needed (dry mouth).     Nebulizer MISC 1 Device by Does not apply route as directed. 1 each 1   omeprazole (PRILOSEC) 40 MG capsule Take 40 mg by mouth every morning.     ondansetron (ZOFRAN-ODT) 4 MG disintegrating tablet Take 1 tablet (4 mg total) by mouth every 8 (eight) hours as needed for nausea or vomiting. 20 tablet 0   oxybutynin (DITROPAN-XL) 10 MG 24 hr tablet Take 10 mg by mouth daily.     pantoprazole (PROTONIX) 40 MG tablet Take 1 tablet (40 mg total) by mouth every morning. 90 tablet 1  Plecanatide (TRULANCE) 3 MG TABS Take 3 mg by mouth daily as needed (constipation).     Polyethyl Glycol-Propyl Glycol (SYSTANE OP) Place 1 drop into both eyes daily as needed (dry eyes).     polyethylene glycol powder (GLYCOLAX/MIRALAX) 17 GM/SCOOP powder Take 1 Container by mouth once.     PRESCRIPTION MEDICATION Inhale into the lungs at bedtime. CPAP     Respiratory Therapy Supplies (NEBULIZER MASK ADULT) MISC 1 kit by Does not apply route as directed. 1 each 1   rivaroxaban (XARELTO) 20 MG TABS tablet Take 1 tablet (20 mg total) by mouth daily with supper. 90 tablet 1   tolterodine (DETROL LA) 4 MG 24 hr capsule Take 4 mg by mouth at bedtime.     triamcinolone (NASACORT) 55 MCG/ACT AERO nasal inhaler Place 2 sprays into the nose daily. 50.7 mL 1   Current Facility-Administered Medications on File Prior to Visit  Medication Dose Route Frequency Provider Last Rate Last Admin   tezepelumab-ekko (TEZSPIRE) 210 MG/1. syringe 210 mg  210 mg Subcutaneous Q28 days Jessica Priest, MD   210 mg at 07/03/23 1328    Review of Systems  Constitutional:   Negative for activity change, appetite change, fatigue, fever and unexpected weight change.       Fatigue is better   HENT:  Negative for congestion, ear pain, rhinorrhea, sinus pressure and sore throat.        Dry mouth    Eyes:  Negative for pain, redness and visual disturbance.  Respiratory:  Negative for cough, shortness of breath and wheezing.   Cardiovascular:  Negative for chest pain and palpitations.  Gastrointestinal:  Negative for abdominal pain, blood in stool, constipation and diarrhea.  Endocrine: Negative for polydipsia and polyuria.  Genitourinary:  Negative for dysuria, frequency and urgency.  Musculoskeletal:  Negative for arthralgias, back pain and myalgias.  Skin:  Negative for pallor and rash.  Allergic/Immunologic: Negative for environmental allergies.  Neurological:  Negative for dizziness, syncope, facial asymmetry, speech difficulty, numbness and headaches.  Hematological:  Negative for adenopathy. Does not bruise/bleed easily.  Psychiatric/Behavioral:  Negative for decreased concentration and dysphoric mood. The patient is not nervous/anxious.        Objective:   Physical Exam Constitutional:      General: She is not in acute distress.    Appearance: Normal appearance. She is well-developed. She is obese. She is not ill-appearing or diaphoretic.  HENT:     Head: Normocephalic and atraumatic.     Mouth/Throat:     Pharynx: Oropharynx is clear. No posterior oropharyngeal erythema.     Comments: MM are mildly dry  Eyes:     Conjunctiva/sclera: Conjunctivae normal.     Pupils: Pupils are equal, round, and reactive to light.  Neck:     Thyroid: No thyromegaly.     Vascular: No carotid bruit or JVD.  Cardiovascular:     Rate and Rhythm: Normal rate and regular rhythm.     Heart sounds: Normal heart sounds.     No gallop.  Pulmonary:     Effort: Pulmonary effort is normal. No respiratory distress.     Breath sounds: Normal breath sounds. No wheezing or  rales.  Abdominal:     General: There is no distension or abdominal bruit.     Palpations: Abdomen is soft.  Musculoskeletal:     Cervical back: Normal range of motion and neck supple.     Right lower leg: No edema.  Left lower leg: No edema.  Lymphadenopathy:     Cervical: No cervical adenopathy.  Skin:    General: Skin is warm and dry.     Coloration: Skin is not pale.     Findings: No rash.  Neurological:     Mental Status: She is alert.     Cranial Nerves: No cranial nerve deficit.     Sensory: No sensory deficit.     Motor: No weakness.     Coordination: Coordination normal.     Deep Tendon Reflexes: Reflexes are normal and symmetric. Reflexes normal.  Psychiatric:        Mood and Affect: Mood normal.           Assessment & Plan:   Problem List Items Addressed This Visit       Cardiovascular and Mediastinum   Hypertension associated with diabetes (HCC)   bp in fair control at this time  BP Readings from Last 1 Encounters:  07/11/23 134/86   No changes needed Most recent labs reviewed  Disc lifstyle change with low sodium diet and exercise  Continues ibersartan 150 mg daily          Endocrine   Neuropathy due to secondary diabetes (HCC)   Did cut her gabapentin dose from podiatry   Wonder if previous dose made her sleepy   Normal pedal pulses today          Other   Other fatigue   Pt had episode of fatigue /sleepiness See a/p for dry mouth/ was seen for these in ER on 12/19 Reviewed hospital records, lab results and studies in detail    Discussed sedating meds including detrol, ditropan and gabapentin (recently cut dose)  Encouraged to discuss with urology       Dry mouth - Primary   Pt was seen in ER on 12/19 for episode of ? Facial droop/slurred speech and also fatigue that in retrospect (she thinks) was just dry mouth Reviewed hospital records, lab results and studies in detail   Overall reassuring  Normal exam today  No signs and  symptoms of cva   Interestingly pt does take both detrol la and oxybutinin for bladder issues  Reviewed last note from Dr Gildardo Griffes Diarmid - and she does take both   Instructed pt to d/w urologist the problem with dry mouth  ? If she needs to be on both medicines due to anticholinergic side effects

## 2023-07-11 NOTE — Assessment & Plan Note (Signed)
Pt had episode of fatigue /sleepiness See a/p for dry mouth/ was seen for these in ER on 12/19 Reviewed hospital records, lab results and studies in detail    Discussed sedating meds including detrol, ditropan and gabapentin (recently cut dose)  Encouraged to discuss with urology

## 2023-07-11 NOTE — Assessment & Plan Note (Signed)
bp in fair control at this time  BP Readings from Last 1 Encounters:  07/11/23 134/86   No changes needed Most recent labs reviewed  Disc lifstyle change with low sodium diet and exercise  Continues ibersartan 150 mg daily

## 2023-07-11 NOTE — Assessment & Plan Note (Signed)
Pt was seen in ER on 12/19 for episode of ? Facial droop/slurred speech and also fatigue that in retrospect (she thinks) was just dry mouth Reviewed hospital records, lab results and studies in detail   Overall reassuring  Normal exam today  No signs and symptoms of cva   Interestingly pt does take both detrol la and oxybutinin for bladder issues  Reviewed last note from Dr Gildardo Griffes Diarmid - and she does take both   Instructed pt to d/w urologist the problem with dry mouth  ? If she needs to be on both medicines due to anticholinergic side effects

## 2023-07-11 NOTE — Patient Instructions (Addendum)
I think your detrol LA and ditropan xl are causing dry mouth  That is something to discuss with your urologist   Take care of yourself  Keep sipping water and using biotine   Let us know if the fatigue returns  Eat regularly    If you do get a facial droop or slurred speech that does not improve with wetting your mouth- get to the ER for stroke evaluation   Take care of yourself

## 2023-07-12 DIAGNOSIS — N390 Urinary tract infection, site not specified: Secondary | ICD-10-CM | POA: Diagnosis not present

## 2023-07-12 DIAGNOSIS — R350 Frequency of micturition: Secondary | ICD-10-CM | POA: Diagnosis not present

## 2023-07-12 DIAGNOSIS — N3941 Urge incontinence: Secondary | ICD-10-CM | POA: Diagnosis not present

## 2023-07-22 ENCOUNTER — Other Ambulatory Visit: Payer: Self-pay | Admitting: Family Medicine

## 2023-07-23 ENCOUNTER — Telehealth: Payer: Self-pay

## 2023-07-23 NOTE — Patient Instructions (Signed)
 Visit Information  Thank you for taking time to visit with me today.  What we discussed today:   Goals Addressed             This Visit's Progress    COMPLETED: Care coordination activities - no follow up needed.       Interventions Today    Flowsheet Row Most Recent Value  Chronic Disease   Chronic disease during today's visit Other  [dry mouth]  Education Interventions   Education Provided Provided Education  [reviewed signs of symptoms of stroke and heart attack.  Advised that symptoms of either stroke or heart attack warrants immediate 911 call.  Confirmed patients address and mailed education articles on stroke/ heart attack.]  Provided Verbal Education On Other  [For dry mouth: advised to take lozenges, mouth wash specific for dry mouth and stay hydrated.]              PLease contact your primary care provider if case management services are needed in the future.   If you are experiencing a Mental Health or Behavioral Health Crisis or need someone to talk to, please call the Suicide and Crisis Lifeline: 988 call 1-800-273-TALK (toll free, 24 hour hotline)  The patient verbalized understanding of instructions, educational materials, and care plan provided today and agreed to receive a mailed copy of patient instructions, educational materials, and care plan.   Arvin Seip RN,BSN,CCM [place]  [facility], [facility] coordinator / Case Manager Phone: [phone]

## 2023-07-23 NOTE — Patient Outreach (Signed)
  Care Coordination   Initial Visit Note   07/23/2023 Name: [name] MRN: [id number] DOB: 1946/05/14  [name] is a 78 y.o. year old female who sees [name] BRAVO, MD for primary care. I spoke with  [name] by phone today.  What matters to the patients health and wellness today?  Patient states she feels she is doing pretty good at this time and managing her health conditions. Patient reports having ED visit 07/04/23. She states she was advised to go to the urgent care due to having possible stroke. Patient voiced frustration that she had to wait in the urgent care for over an hour.  Patient states she was eventually evaluated and was cleared related to potential stroke.  Patient states she feels she was told to go to the urgent care in error because she was not actually having a stroke.  Patient denies need for ongoing case management follow up and denies having any community resource needs.    Goals Addressed             This Visit's Progress    COMPLETED: Care coordination activities - no follow up needed.       Interventions Today    Flowsheet Row Most Recent Value  Chronic Disease   Chronic disease during today's visit Other  [dry mouth]  Education Interventions   Education Provided Provided Education  [reviewed signs of symptoms of stroke and heart attack.  Advised that symptoms of either stroke or heart attack warrants immediate 911 call.  Confirmed patients address and mailed education articles on stroke/ heart attack.]  Provided Verbal Education On Other  [For dry mouth: advised to take lozenges, mouth wash specific for dry mouth and stay hydrated.]              SDOH assessments and interventions completed:  No     Care Coordination Interventions:  Yes, provided   Follow up plan: No further intervention required.   Encounter Outcome:  Patient Visit Completed   Alicia Wik RN,BSN,CCM [facility]  [facility], [facility] coordinator / Case Manager Phone: [phone]

## 2023-07-30 ENCOUNTER — Ambulatory Visit (INDEPENDENT_AMBULATORY_CARE_PROVIDER_SITE_OTHER): Payer: Medicare Other | Admitting: *Deleted

## 2023-07-30 DIAGNOSIS — J4550 Severe persistent asthma, uncomplicated: Secondary | ICD-10-CM

## 2023-08-02 ENCOUNTER — Ambulatory Visit (INDEPENDENT_AMBULATORY_CARE_PROVIDER_SITE_OTHER): Payer: Medicare Other | Admitting: Family Medicine

## 2023-08-02 ENCOUNTER — Encounter: Payer: Self-pay | Admitting: Family Medicine

## 2023-08-02 VITALS — BP 128/70 | HR 50 | Temp 98.2°F | Ht 63.75 in | Wt 183.4 lb

## 2023-08-02 DIAGNOSIS — E1140 Type 2 diabetes mellitus with diabetic neuropathy, unspecified: Secondary | ICD-10-CM | POA: Diagnosis not present

## 2023-08-02 DIAGNOSIS — R5383 Other fatigue: Secondary | ICD-10-CM | POA: Diagnosis not present

## 2023-08-02 DIAGNOSIS — R682 Dry mouth, unspecified: Secondary | ICD-10-CM

## 2023-08-02 DIAGNOSIS — I152 Hypertension secondary to endocrine disorders: Secondary | ICD-10-CM

## 2023-08-02 DIAGNOSIS — E1159 Type 2 diabetes mellitus with other circulatory complications: Secondary | ICD-10-CM

## 2023-08-02 LAB — POCT GLYCOSYLATED HEMOGLOBIN (HGB A1C): Hemoglobin A1C: 5.6 % (ref 4.0–5.6)

## 2023-08-02 NOTE — Assessment & Plan Note (Signed)
Chronic, well controlled with diet  She will continue work on low sugar low carbohydrate diet.

## 2023-08-02 NOTE — Assessment & Plan Note (Signed)
Chronic, well controlled on irbesartan 150 mg daily

## 2023-08-02 NOTE — Addendum Note (Signed)
Addended by: Damita Lack on: 08/02/2023 12:08 PM   Modules accepted: Orders

## 2023-08-02 NOTE — Assessment & Plan Note (Signed)
Acute, significant improvement with stopping anticholinergics.

## 2023-08-02 NOTE — Assessment & Plan Note (Signed)
Acute, possibly secondary to multiple anticholinergics versus not using CPAP.  She is feeling somewhat better after stopping duplicate medications through urology.  She will start wearing the CPAP and will contact neurology if she continues to have error messages.

## 2023-08-02 NOTE — Progress Notes (Signed)
Patient ID: [name], female    DOB: [birth year], 78 y.o.   MRN: [id number]  This visit was conducted in person.  BP 128/70 (BP Location: Left Arm, Patient Position: Sitting, Cuff Size: Large)   Pulse (!) 50   Temp 98.2 F (36.8 C) (Temporal)   Ht 5' 3.75" (1.619 m)   Wt 183 lb 6 oz (83.2 kg)   SpO2 99%   BMI 31.72 kg/m    CC:  Chief Complaint  Patient presents with   Medical Management of Chronic Issues    3 month follow up    Subjective:   HPI: [name] is a 78 y.o. female presenting on 08/02/2023 for Medical Management of Chronic Issues (3 month follow up)  Right anterior knee hematoma improved.... going down less pain.   Had ED visit 07/04/2023 for fatigue.. negative evaluation.  Felt okay.[name] dresser though her mouth looked funny and  speech was slurred... felt it was from her dry mouth but felt excessively sleepy.  BMP and cbc nml. Nml neuro exam. EKG showed aflutter Everything resolved. Sleeping  well, has not been using CPAP given below issue.   Followe dup with Dr. Milinda Antis for ER follow up on 12/26: Discussed sedating meds including detrol, ditropan and gabapentin (recently cut dose).  She has no been taken off of Detrol LA/ditropan by urologist.  Now placed on new med... will call with med name to add to med list. She is feeling better off of these medications. OAB is tolerable.   Hypertension:  Blood pressure well controlled on irbesartan 150 mg p.o. daily BP Readings from Last 3 Encounters:  08/02/23 128/70  07/11/23 134/86  07/04/23 (!) 109/96  Using medication without problems or lightheadedness:  Chest pain with exertion: none Edema:  off and on on left chronically since knee replacement. Short of breath: stable Average home BPs: Other issues: Wt Readings from Last 3 Encounters:  08/02/23 183 lb 6 oz (83.2 kg)  07/11/23 192 lb (87.1 kg)  06/27/23 193 lb (87.5 kg)   Diabetes: Excellent control with diet Lab Results  Component  Value Date   HGBA1C 5.8 04/26/2023  Using medications without difficulties: Hypoglycemic episodes: Hyperglycemic episodes: Feet problems: none Blood Sugars averaging: eye exam within last year: yes  Elevated Cholesterol: Cholesterol well controlled on Lipitor 10 mg p.o. daily Lab Results  Component Value Date   CHOL 72 04/26/2023   HDL 40.50 04/26/2023   LDLCALC 24 04/26/2023   TRIG 38.0 04/26/2023   CHOLHDL 2 04/26/2023  Using medications without problems: Muscle aches:  Diet compliance: Good Exercise: Minimal Other complaints:    Paroxysmal atrial fibrillation and persistent atypical aflutter, sick sinus syndrome, PAH, diastolic CHF followed by Dr. Royann Shivers reviewed last office 04/19/2023  CHADsVASC 4 On Xarelto anticoagulation.  Asthma moderate persistent followed by allergist and pulmonary.  On Spiriva and Flovent. Avoid nonselective beta-blockers given arrhythmia  Sleep apnea.Marland Kitchen trying to get it fixed.. CPAP says it is leaking.  Relevant past medical, surgical, family and social history reviewed and updated as indicated. Interim medical history since our last visit reviewed. Allergies and medications reviewed and updated. Outpatient Medications Prior to Visit  Medication Sig Dispense Refill   ADVAIR HFA 230-21 MCG/ACT inhaler Inhale 2 puffs into the lungs 2 (two) times daily. 2 puffs 1-2 times daily depending on disease activity. 36 g 1   albuterol (PROVENTIL) (2.5 MG/3ML) 0.083% nebulizer solution Take 3 mLs (2.5 mg total) by nebulization every 6 (six) hours  as needed for wheezing or shortness of breath. 150 mL 1   albuterol (VENTOLIN HFA) 108 (90 Base) MCG/ACT inhaler INHALE 2 PUFFS BY MOUTH EVERY 6 HOURS AS NEEDED FOR WHEEZING OR SHORTNESS OF BREATH 18 g 1   anastrozole (ARIMIDEX) 1 MG tablet TAKE 1 TABLET(1 MG) BY MOUTH DAILY 90 tablet 3   atorvastatin (LIPITOR) 10 MG tablet TAKE 1 TABLET(10 MG) BY MOUTH DAILY 90 tablet 3   Blood Glucose Monitoring Suppl (ONETOUCH  VERIO) w/Device KIT Use to check blood sugar up to 2 times a day 1 kit 0   cyclobenzaprine (FLEXERIL) 10 MG tablet Take 0.5-1 tablets (5-10 mg total) by mouth at bedtime as needed for muscle spasms. 20 tablet 0   desloratadine (CLARINEX) 5 MG tablet TAKE 1 TABLET(5 MG) BY MOUTH DAILY 90 tablet 1   EPINEPHrine 0.3 mg/0.3 mL IJ SOAJ injection USE AS DIRECTED FOR LIFE THREATENING ALLERGIC REACTIONS 2 each 1   estradiol (ESTRACE) 0.1 MG/GM vaginal cream Place 1 Applicatorful vaginally 2 (two) times a week. (As Needed)     famotidine (PEPCID) 40 MG tablet Take 1 tablet (40 mg total) by mouth at bedtime. Take 1 (ONE) tablet by mouth every evening. 90 tablet 1   fluconazole (DIFLUCAN) 150 MG tablet Take one pill by mouth now and repeat dose in 3 days 2 tablet 0   fluticasone (FLOVENT HFA) 220 MCG/ACT inhaler Inhale 2 puffs into the lungs 2 (two) times daily. 2 puffs 2 times daily during flare up. 36 g 1   gabapentin (NEURONTIN) 300 MG capsule Take one capsule in the morning, one at lunch and two at bedtime 360 capsule 3   glucose blood (ONETOUCH VERIO) test strip 1 strip 2 (two) times daily     GLYXAMBI 10-5 MG TABS TAKE 1 TABLET BY MOUTH DAILY 30 tablet 5   hydrOXYzine (ATARAX) 10 MG tablet Take 1 tablet (10 mg total) by mouth daily as needed for itching. 30 tablet 2   ipratropium (ATROVENT) 0.06 % nasal spray Place 2 sprays into both nostrils 2 (two) times daily as needed for rhinitis. 45 mL 1   irbesartan (AVAPRO) 150 MG tablet TAKE 1 TABLET(150 MG) BY MOUTH DAILY 90 tablet 2   Lactase (DAIRY-RELIEF PO) Take 1 capsule by mouth daily as needed (eating dairy).     Lancets (ONETOUCH DELICA PLUS LANCET33G) MISC USE TO CHECK BLOOD SUGAR UP TO TWICE DAILY 100 each 5   levalbuterol (XOPENEX) 1.25 MG/3ML nebulizer solution USE 1 VIAL VIA NEBULIZER EVERY 6 HOURS AS NEEDED FOR SHORTNESS OF BREATH OR WHEEZING 90 mL 3   Magnesium 500 MG TABS Take 500 mg by mouth 2 (two) times daily.     Menthol, Topical Analgesic,  (ABSORBINE PLUS JR EX) Apply 1 patch topically daily as needed (pain).     Menthol-Methyl Salicylate (SALONPAS JET SPRAY EX) Apply 1 spray topically daily as needed (knee pain).     methimazole (TAPAZOLE) 5 MG tablet Take 5 mg by mouth daily.     montelukast (SINGULAIR) 10 MG tablet Take 1 tablet (10 mg total) by mouth at bedtime. For asthma control. 90 tablet 1   Mouthwashes (BIOTENE DRY MOUTH MT) Use as directed 1 Dose in the mouth or throat daily as needed (dry mouth).     Nebulizer MISC 1 Device by Does not apply route as directed. 1 each 1   omeprazole (PRILOSEC) 40 MG capsule Take 40 mg by mouth every morning.     ondansetron (ZOFRAN-ODT) 4 MG  disintegrating tablet Take 1 tablet (4 mg total) by mouth every 8 (eight) hours as needed for nausea or vomiting. 20 tablet 0   pantoprazole (PROTONIX) 40 MG tablet Take 1 tablet (40 mg total) by mouth every morning. 90 tablet 1   Plecanatide (TRULANCE) 3 MG TABS Take 3 mg by mouth daily as needed (constipation).     Polyethyl Glycol-Propyl Glycol (SYSTANE OP) Place 1 drop into both eyes daily as needed (dry eyes).     polyethylene glycol powder (GLYCOLAX/MIRALAX) 17 GM/SCOOP powder Take 1 Container by mouth once.     PRESCRIPTION MEDICATION Inhale into the lungs at bedtime. CPAP     Respiratory Therapy Supplies (NEBULIZER MASK ADULT) MISC 1 kit by Does not apply route as directed. 1 each 1   rivaroxaban (XARELTO) 20 MG TABS tablet Take 1 tablet (20 mg total) by mouth daily with supper. 90 tablet 1   triamcinolone (NASACORT) 55 MCG/ACT AERO nasal inhaler Place 2 sprays into the nose daily. 50.7 mL 1   oxybutynin (DITROPAN-XL) 10 MG 24 hr tablet Take 10 mg by mouth daily.     tolterodine (DETROL LA) 4 MG 24 hr capsule Take 4 mg by mouth at bedtime.     Facility-Administered Medications Prior to Visit  Medication Dose Route Frequency Provider Last Rate Last Admin   tezepelumab-ekko (TEZSPIRE) 210 MG/1. syringe 210 mg  210 mg Subcutaneous Q28 days  Jessica Priest, MD   210 mg at 07/30/23 1014     Per HPI unless specifically indicated in ROS section below Review of Systems  Constitutional:  Negative for fatigue and fever.  HENT:  Negative for congestion.   Eyes:  Negative for pain.  Respiratory:  Negative for cough and shortness of breath.   Cardiovascular:  Negative for chest pain, palpitations and leg swelling.  Gastrointestinal:  Negative for abdominal pain.  Genitourinary:  Negative for dysuria and vaginal bleeding.  Musculoskeletal:  Negative for back pain.  Neurological:  Negative for syncope, light-headedness and headaches.  Psychiatric/Behavioral:  Negative for dysphoric mood.    Objective:  BP 128/70 (BP Location: Left Arm, Patient Position: Sitting, Cuff Size: Large)   Pulse (!) 50   Temp 98.2 F (36.8 C) (Temporal)   Ht 5' 3.75" (1.619 m)   Wt 183 lb 6 oz (83.2 kg)   SpO2 99%   BMI 31.72 kg/m   Wt Readings from Last 3 Encounters:  08/02/23 183 lb 6 oz (83.2 kg)  07/11/23 192 lb (87.1 kg)  06/27/23 193 lb (87.5 kg)      Physical Exam Constitutional:      General: She is not in acute distress.    Appearance: Normal appearance. She is well-developed. She is not ill-appearing or toxic-appearing.  HENT:     Head: Normocephalic.     Right Ear: Hearing, tympanic membrane, ear canal and external ear normal. Tympanic membrane is not erythematous, retracted or bulging.     Left Ear: Hearing, tympanic membrane, ear canal and external ear normal. Tympanic membrane is not erythematous, retracted or bulging.     Nose: No mucosal edema or rhinorrhea.     Right Sinus: No maxillary sinus tenderness or frontal sinus tenderness.     Left Sinus: No maxillary sinus tenderness or frontal sinus tenderness.     Mouth/Throat:     Pharynx: Uvula midline.  Eyes:     General: Lids are normal. Lids are everted, no foreign bodies appreciated.     Conjunctiva/sclera: Conjunctivae normal.  Pupils: Pupils are equal, round, and  reactive to light.  Neck:     Thyroid: No thyroid mass or thyromegaly.     Vascular: No carotid bruit.     Trachea: Trachea normal.  Cardiovascular:     Rate and Rhythm: Normal rate and regular rhythm.     Pulses: Normal pulses.     Heart sounds: Normal heart sounds, S1 normal and S2 normal. No murmur heard.    No friction rub. No gallop.  Pulmonary:     Effort: Pulmonary effort is normal. No tachypnea or respiratory distress.     Breath sounds: Normal breath sounds. No decreased breath sounds, wheezing, rhonchi or rales.  Abdominal:     General: Bowel sounds are normal.     Palpations: Abdomen is soft.     Tenderness: There is no abdominal tenderness.  Musculoskeletal:     Cervical back: Normal range of motion and neck supple.  Skin:    General: Skin is warm and dry.     Findings: No rash.  Neurological:     Mental Status: She is alert.  Psychiatric:        Mood and Affect: Mood is not anxious or depressed.        Speech: Speech normal.        Behavior: Behavior normal. Behavior is cooperative.        Thought Content: Thought content normal.        Judgment: Judgment normal.       Results for orders placed or performed during the hospital encounter of 07/04/23  CBG monitoring, ED   Collection Time: 07/04/23  4:07 PM  Result Value Ref Range   Glucose-Capillary 93 70 - 99 mg/dL  Basic metabolic panel   Collection Time: 07/04/23  4:10 PM  Result Value Ref Range   Sodium 139 135 - 145 mmol/L   Potassium 4.5 3.5 - 5.1 mmol/L   Chloride 104 98 - 111 mmol/L   CO2 28 22 - 32 mmol/L   Glucose, Bld 90 70 - 99 mg/dL   BUN 12 8 - 23 mg/dL   Creatinine, Ser 1.61 0.44 - 1.00 mg/dL   Calcium 9.3 8.9 - 09.6 mg/dL   GFR, Estimated >04 >54 mL/min   Anion gap 7 5 - 15  CBC   Collection Time: 07/04/23  4:10 PM  Result Value Ref Range   WBC 6.7 4.0 - 10.5 K/uL   RBC 4.78 3.87 - 5.11 MIL/uL   Hemoglobin 11.6 (L) 12.0 - 15.0 g/dL   HCT 09.8 (L) 11.9 - 14.7 %   MCV 73.4 (L) 80.0 -  100.0 fL   MCH 24.3 (L) 26.0 - 34.0 pg   MCHC 33.0 30.0 - 36.0 g/dL   RDW 82.9 (H) 56.2 - 13.0 %   Platelets 244 150 - 400 K/uL   nRBC 0.0 0.0 - 0.2 %  Urinalysis, Routine w reflex microscopic -Urine, Clean Catch   Collection Time: 07/04/23  4:10 PM  Result Value Ref Range   Color, Urine COLORLESS (A) YELLOW   APPearance CLEAR CLEAR   Specific Gravity, Urine <1.005 (L) 1.005 - 1.030   pH 6.5 5.0 - 8.0   Glucose, UA >1,000 (A) NEGATIVE mg/dL   Hgb urine dipstick TRACE (A) NEGATIVE   Bilirubin Urine NEGATIVE NEGATIVE   Ketones, ur NEGATIVE NEGATIVE mg/dL   Protein, ur NEGATIVE NEGATIVE mg/dL   Nitrite NEGATIVE NEGATIVE   Leukocytes,Ua NEGATIVE NEGATIVE   RBC / HPF 0-5 0 - 5  RBC/hpf   WBC, UA 0-5 0 - 5 WBC/hpf   Bacteria, UA NONE SEEN NONE SEEN   Squamous Epithelial / HPF 0-5 0 - 5 /HPF   *Note: Due to a large number of results and/or encounters for the requested time period, some results have not been displayed. A complete set of results can be found in Results Review.     COVID 19 screen:  No recent travel or known exposure to COVID19 The patient denies respiratory symptoms of COVID 19 at this time. The importance of social distancing was discussed today.   Assessment and Plan The patient's preventative maintenance and recommended screening tests for an annual wellness exam were reviewed in full today. Brought up to date unless services declined.  Counselled on the importance of diet, exercise, and its role in overall health and mortality. The patient's FH and SH was reviewed, including their home life, tobacco status, and drug and alcohol status.   Mammogram: history of breast cancer,  02/2022... scheduled for repeat 2024 DEXA 06/2022 normal.  PAP not indicated Hep C neg  Vaccines: uptodate with tdap, PNA, flu uptodate, and COVID x 5, she says she has had shingles series.  Colonoscopy 09/2022, repeat in 5 years   Problem List Items Addressed This Visit     Dry mouth     Acute, significant improvement with stopping anticholinergics.      Hypertension associated with diabetes (HCC) - Primary   Chronic, well controlled on irbesartan 150 mg daily      Other fatigue   Acute, possibly secondary to multiple anticholinergics versus not using CPAP.  She is feeling somewhat better after stopping duplicate medications through urology.  She will start wearing the CPAP and will contact neurology if she continues to have error messages.      Type 2 diabetes mellitus with diabetic neuropathy, unspecified (HCC)   Chronic, well controlled with diet  She will continue work on low sugar low carbohydrate diet.        Patient Care Team: Excell Seltzer, MD as PCP - General (Family Medicine) Croitoru, Rachelle Hora, MD as PCP - Cardiology (Cardiology) Lucie Leather, Alvira Philips, MD as Consulting Physician (Allergy and Immunology) Darci Needle, MD as Consulting Physician (Endocrinology) Merwyn Katos, DPM as Consulting Physician (Podiatry) Cherlyn Roberts, MD as Consulting Physician (Dermatology) Aletha Halim, MD as Referring Physician (Specialist) Kin, Elon Jester, DC as Referring Physician (Chiropractic Medicine) Bernette Redbird, MD as Consulting Physician (Gastroenterology) Hallows, Walker Kehr, MD (Orthopedic Surgery) Dohmeier, Porfirio Mylar, MD as Consulting Physician (Neurology) Harriette Bouillon, MD as Consulting Physician (General Surgery) Antony Blackbird, MD as Consulting Physician (Radiation Oncology) Kathyrn Sheriff, [facility] (Inactive) as Pharmacist (Pharmacist) Hurley Cisco, Clent Jacks, [place] as Social Worker Serena Croissant, MD as Consulting Physician (Hematology and Oncology) Alfredo Martinez, MD as Consulting Physician (Urology) Mateo Flow, MD as Consulting Physician (Ophthalmology) [facility], [facility]   Kerby Nora, MD

## 2023-08-06 ENCOUNTER — Other Ambulatory Visit: Payer: Self-pay

## 2023-08-06 ENCOUNTER — Ambulatory Visit (INDEPENDENT_AMBULATORY_CARE_PROVIDER_SITE_OTHER): Payer: Medicare Other | Admitting: Allergy and Immunology

## 2023-08-06 ENCOUNTER — Encounter: Payer: Self-pay | Admitting: Allergy and Immunology

## 2023-08-06 VITALS — BP 124/80 | HR 73 | Temp 98.0°F | Resp 20 | Wt 182.1 lb

## 2023-08-06 DIAGNOSIS — J4550 Severe persistent asthma, uncomplicated: Secondary | ICD-10-CM

## 2023-08-06 DIAGNOSIS — K219 Gastro-esophageal reflux disease without esophagitis: Secondary | ICD-10-CM | POA: Diagnosis not present

## 2023-08-06 DIAGNOSIS — J3089 Other allergic rhinitis: Secondary | ICD-10-CM

## 2023-08-06 DIAGNOSIS — Z91018 Allergy to other foods: Secondary | ICD-10-CM | POA: Diagnosis not present

## 2023-08-06 NOTE — Progress Notes (Unsigned)
[place] - [place] - [employer] - [place] - [place]   Follow-up Note  Referring Provider: Excell Seltzer, MD Primary Provider: Excell Seltzer, MD Date of Office Visit: 08/06/2023  Subjective:   [name] (DOB: Feb 10, 1946) is a 78 y.o. female who [facility] in re-evaluation of the following:  HPI: [name] returns to this clinic in evaluation of asthma, history pulmonary hypertension/diastolic dysfunction, allergic rhinitis, LPR, esophageal dysfunction, and food allergy directed against shellfish/fish.  I last saw her in this clinic 28 May 2023.   She has really done very well with her asthma and she rarely uses a short acting bronchodilator and for the most part she can exert herself to the extent that her musculoskeletal issues allow her to do so without much problem.  She continues on a large collection of anti-inflammatory agents for her airway including the use of anti-TSLP antibody.  She has had very little problems with her upper airways.  She continues on a nasal steroid.  She has been having a lot of mucus in her throat and a lot of throat clearing.  She does believe that her reflux is under pretty good control at this point in time.  When I last saw her in this clinic she was having postprandial sternal chest pain and she has been evaluated by her gastroenterologist with manometry and a pH meter exam but she does not know the results of those studies as she has not had a follow-up visit.  She has had alteration of her medicines for her bladder issue which was causing significant dryness and since that manipulation she is actually much better regarding both her swallowing and her eating and her dry mouth issue.  She does not consume shellfish or fish.  She has lost substantial amount of weight voluntarily.  She is down to around 185 pounds at this point.  She has a target weight of 150-160.  Allergies as of 08/06/2023        Reactions   Dilantin [phenytoin] Swelling   facial swelling   Latex Hives   Oxycodone Nausea And Vomiting, Nausea Only   Abdominal Pain, Vomiting   Penicillins Nausea And Vomiting, Swelling   Has patient had a PCN reaction causing immediate rash, facial/tongue/throat swelling, SOB or lightheadedness with hypotension patient had a PCN reaction causing severe rash involving mucus membranes or skin necrosis: [id number] Has patient had a PCN reaction that required hospitalization/No Has patient had a PCN reaction occurring within the last 10 years: No If all of the above answers are "NO", then may proceed with Cephalosporin use.   Codeine Nausea Only   Tolerates tylenol with codeine   Hydrocodone Nausea And Vomiting   Nickel Hives, Rash   Pregabalin Itching, Other (See Comments)   headaches/problem w/vision        Medication List    ABSORBINE PLUS JR EX Apply 1 patch topically daily as needed (pain).   Advair HFA 230-21 MCG/ACT inhaler Generic drug: fluticasone-salmeterol Inhale 2 puffs into the lungs 2 (two) times daily. 2 puffs 1-2 times daily depending on disease activity.   albuterol 108 (90 Base) MCG/ACT inhaler Commonly known as: VENTOLIN HFA INHALE 2 PUFFS BY MOUTH EVERY 6 HOURS AS NEEDED FOR WHEEZING OR SHORTNESS OF BREATH   albuterol (2.5 MG/3ML) 0.083% nebulizer solution Commonly known as: PROVENTIL Take 3 mLs (2.5 mg total) by nebulization every 6 (six) hours as needed for wheezing or shortness of breath.  anastrozole 1 MG tablet Commonly known as: ARIMIDEX TAKE 1 TABLET(1 MG) BY MOUTH DAILY   atorvastatin 10 MG tablet Commonly known as: LIPITOR TAKE 1 TABLET(10 MG) BY MOUTH DAILY   BIOTENE DRY MOUTH MT Use as directed 1 Dose in the mouth or throat daily as needed (dry mouth).   cyclobenzaprine 10 MG tablet Commonly known as: FLEXERIL Take 0.5-1 tablets (5-10 mg total) by mouth at bedtime as needed for muscle spasms.   DAIRY-RELIEF PO Take 1  capsule by mouth daily as needed (eating dairy).   desloratadine 5 MG tablet Commonly known as: CLARINEX TAKE 1 TABLET(5 MG) BY MOUTH DAILY   EPINEPHrine 0.3 mg/0.3 mL Soaj injection Commonly known as: EPI-PEN USE AS DIRECTED FOR LIFE THREATENING ALLERGIC REACTIONS   estradiol 0.1 MG/GM vaginal cream Commonly known as: ESTRACE Place 1 Applicatorful vaginally 2 (two) times a week. (As Needed)   famotidine 40 MG tablet Commonly known as: PEPCID Take 1 tablet (40 mg total) by mouth at bedtime. Take 1 (ONE) tablet by mouth every evening.   fluconazole 150 MG tablet Commonly known as: DIFLUCAN Take one pill by mouth now and repeat dose in 3 days   fluticasone 220 MCG/ACT inhaler Commonly known as: Flovent HFA Inhale 2 puffs into the lungs 2 (two) times daily. 2 puffs 2 times daily during flare up.   gabapentin 300 MG capsule Commonly known as: NEURONTIN Take one capsule in the morning, one at lunch and two at bedtime   Gemtesa 75 MG Tabs Generic drug: Vibegron Take 75 mg by mouth daily.   Glyxambi 10-5 MG Tabs Generic drug: Empagliflozin-linaGLIPtin TAKE 1 TABLET BY MOUTH DAILY   hydrOXYzine 10 MG tablet Commonly known as: ATARAX Take 1 tablet (10 mg total) by mouth daily as needed for itching.   ipratropium 0.06 % nasal spray Commonly known as: ATROVENT Place 2 sprays into both nostrils 2 (two) times daily as needed for rhinitis.   irbesartan 150 MG tablet Commonly known as: AVAPRO TAKE 1 TABLET(150 MG) BY MOUTH DAILY   levalbuterol 1.25 MG/3ML nebulizer solution Commonly known as: XOPENEX USE 1 VIAL VIA NEBULIZER EVERY 6 HOURS AS NEEDED FOR SHORTNESS OF BREATH OR WHEEZING   Magnesium 500 MG Tabs Take 500 mg by mouth 2 (two) times daily.   methimazole 5 MG tablet Commonly known as: TAPAZOLE Take 5 mg by mouth daily.   montelukast 10 MG tablet Commonly known as: SINGULAIR Take 1 tablet (10 mg total) by mouth at bedtime. For asthma control.   Nebulizer Mask  Adult Misc 1 kit by Does not apply route as directed.   Nebulizer Misc 1 Device by Does not apply route as directed.   omeprazole 40 MG capsule Commonly known as: PRILOSEC Take 40 mg by mouth every morning.   ondansetron 4 MG disintegrating tablet Commonly known as: ZOFRAN-ODT Take 1 tablet (4 mg total) by mouth every 8 (eight) hours as needed for nausea or vomiting.   OneTouch Delica Plus Lancet33G Misc USE TO CHECK BLOOD SUGAR UP TO TWICE DAILY   OneTouch Verio test strip Generic drug: glucose blood 1 strip 2 (two) times daily   OneTouch Verio w/Device Kit Use to check blood sugar up to 2 times a day   pantoprazole 40 MG tablet Commonly known as: Protonix Take 1 tablet (40 mg total) by mouth every morning.   polyethylene glycol powder 17 GM/SCOOP powder Commonly known as: GLYCOLAX/MIRALAX Take 1 Container by mouth once.   PRESCRIPTION MEDICATION Inhale into the lungs  at bedtime. CPAP   rivaroxaban 20 MG Tabs tablet Commonly known as: Xarelto Take 1 tablet (20 mg total) by mouth daily with supper.   SALONPAS JET SPRAY EX Apply 1 spray topically daily as needed (knee pain).   SYSTANE OP Place 1 drop into both eyes daily as needed (dry eyes).   triamcinolone 55 MCG/ACT Aero nasal inhaler Commonly known as: NASACORT Place 2 sprays into the nose daily.   Trulance 3 MG Tabs Generic drug: Plecanatide Take 3 mg by mouth daily as needed (constipation).    Past Medical History:  Diagnosis Date   Allergic rhinitis    Allergy    Arthritis    Asthma    Breast cancer (HCC)    Chronic headache    Diabetes mellitus    Ductal carcinoma in situ (DCIS) of left breast 02/23/2022   DVT (deep venous thrombosis) (HCC)    Dyspnea    Heart murmur    History of radiation therapy    Left Breast 05/02/22-05/30/22- Dr. Antony Blackbird   Hypertension    Penicillin allergy 01/19/2020   Pulmonary embolism (HCC)    Pulmonary hypertension (HCC)    Seizures (HCC)     Past  Surgical History:  Procedure Laterality Date   ABDOMINAL HYSTERECTOMY     partial, has ovaries   BREAST LUMPECTOMY WITH RADIOACTIVE SEED LOCALIZATION Left 03/29/2022   Procedure: LEFT BREAST LUMPECTOMY WITH RADIOACTIVE SEED LOCALIZATION;  Surgeon: Harriette Bouillon, MD;  Location: [facility] OR;  Service: General;  Laterality: Left;   CARDIAC CATHETERIZATION  12/28/2010   Mod. pulmonary hypertension, normal coronary arteries   CARDIOVERSION N/A 11/23/2020   Procedure: CARDIOVERSION;  Surgeon: Thurmon Fair, MD;  Location: [facility];  Service: Cardiovascular;  Laterality: N/A;   CHOLECYSTECTOMY N/A 12/20/2021   Procedure: LAPAROSCOPIC CHOLECYSTECTOMY WITH INTRAOPERATIVE CHOLANGIOGRAM;  Surgeon: Darnell Level, MD;  Location: [facility] ORS;  Service: General;  Laterality: N/A;   DOPPLER ECHOCARDIOGRAPHY  10/08/2011   EF=>55%,mild asymmetric LVH, mod. TR, mod. PH, mild to mod LA dilatation   ESOPHAGEAL MANOMETRY N/A 06/19/2023   Procedure: ESOPHAGEAL MANOMETRY (EM);  Surgeon: Kerin Salen, MD;  Location: [facility];  Service: Gastroenterology;  Laterality: N/A;   IR LUMBAR DISC ASPIRATION W/IMG GUIDE  01/12/2020   KNEE ARTHROSCOPY Left    KNEE SURGERY     Nuclear Stress Test  05/20/2006   No ischemia   PARTIAL HYSTERECTOMY     PLANTAR FASCIA SURGERY     TONSILLECTOMY      Review of systems negative except as noted in HPI / PMHx or noted below:  Review of Systems  Constitutional: Negative.   HENT: Negative.    Eyes: Negative.   Respiratory: Negative.    Cardiovascular: Negative.   Gastrointestinal: Negative.   Genitourinary: Negative.   Musculoskeletal: Negative.   Skin: Negative.   Neurological: Negative.   Endo/Heme/Allergies: Negative.   Psychiatric/Behavioral: Negative.       Objective:   Vitals:   08/06/23 0958  BP: 124/80  Pulse: 73  Resp: 20  Temp: 98 F (36.7 C)  SpO2: 96%      Weight: 182 lb 1.6 oz (82.6 kg)   Physical Exam Constitutional:      Appearance: She is not  diaphoretic.  HENT:     Head: Normocephalic.     Right Ear: Tympanic membrane, ear canal and external ear normal.     Left Ear: Tympanic membrane, ear canal and external ear normal.     Nose: Nose normal. No mucosal edema  or rhinorrhea.     Mouth/Throat:     Pharynx: Uvula midline. No oropharyngeal exudate.  Eyes:     Conjunctiva/sclera: Conjunctivae normal.  Neck:     Thyroid: No thyromegaly.     Trachea: Trachea normal. No tracheal tenderness or tracheal deviation.  Cardiovascular:     Rate and Rhythm: Normal rate and regular rhythm.     Heart sounds: S1 normal and S2 normal. Murmur (systolic) heard.  Pulmonary:     Effort: No respiratory distress.     Breath sounds: Normal breath sounds. No stridor. No wheezing or rales.  Lymphadenopathy:     Head:     Right side of head: No tonsillar adenopathy.     Left side of head: No tonsillar adenopathy.     Cervical: No cervical adenopathy.  Skin:    Findings: No erythema or rash.     Nails: There is no clubbing.  Neurological:     Mental Status: She is alert.     Diagnostics: Spirometry was performed and demonstrated an FEV1 of 1.37 at 79 % of predicted.  Assessment and Plan:   1. Asthma, severe persistent, well-controlled   2. Other allergic rhinitis   3. LPRD (laryngopharyngeal reflux disease)   4. Food allergy    1. Continue to treat and prevent airway inflammation:  A.  Advair 230 - 2 inhalations 2 times a day (empty lungs)  B.  Montelukast 10mg  - 1 tablet 1 time per day.  C.  Tezepelumab injections.    2. Continue to treat reflux / swallowing issue:  A. Pantoprazole 40mg  in AM + famotidine 40 mg in PM B. Follow up with GI doctor C. Replace throat clearing with swallowing / drinking maneuver  3. If needed:   A. Nasal saline several times per day  B. Albuterol + Fluticasone 220 - 2 inhalations TOGETHER every 6 hours  C. Epi-pen    4. Return to clinic in 12 weeks or earlier if there is a problem.  5. Influenza  = Tamiflu. Covid = molnupiravir  [name] appears to be doing pretty well.  The 1 issue that still remains a problem is her throat clearing and the mucus stuck in her throat and I had a talk with her today about the need to replace all for throat clearing with a swallowing or drinking maneuver.  Otherwise, she is never going to completely clear out the irritation of her throat.  She will remain on therapy directed against reflux as noted above and follow-up with her GI doctor regarding the diagnostic studies that she had completed recently.  She will continue on anti-inflammatory agents for airway including use of anti-TSLP antibody.  If she does well I will see her back in this clinic in 12 weeks or earlier if there is a problem.   Laurette Schimke, MD Allergy / Immunology [facility]

## 2023-08-06 NOTE — Patient Instructions (Addendum)
  1. Continue to treat and prevent airway inflammation:  A.  Advair 230 - 2 inhalations 2 times a day (empty lungs)  B.  Montelukast 10mg  - 1 tablet 1 time per day.  C.  Tezepelumab injections.    2. Continue to treat reflux / swallowing issue:  A. Pantoprazole 40mg  in AM + famotidine 40 mg in PM B. Follow up with GI doctor C. Replace throat clearing with swallowing / drinking maneuver  3. If needed:   A. Nasal saline several times per day  B. Albuterol + Fluticasone 220 - 2 inhalations TOGETHER every 6 hours  C. Epi-pen    4. Return to clinic in 12 weeks or earlier if there is a problem.  5. Influenza = Tamiflu. Covid = molnupiravir

## 2023-08-07 ENCOUNTER — Encounter: Payer: Self-pay | Admitting: Allergy and Immunology

## 2023-08-07 ENCOUNTER — Telehealth: Payer: Self-pay | Admitting: Allergy and Immunology

## 2023-08-07 MED ORDER — FLUTICASONE PROPIONATE HFA 220 MCG/ACT IN AERO: 2.0000 | INHALATION_SPRAY | Freq: Two times a day (BID) | RESPIRATORY_TRACT | 5 refills | Status: DC

## 2023-08-07 MED ORDER — EPINEPHRINE 0.3 MG/0.3ML IJ SOAJ: INTRAMUSCULAR | 1 refills | Status: DC

## 2023-08-07 MED ORDER — ALBUTEROL SULFATE HFA 108 (90 BASE) MCG/ACT IN AERS: INHALATION_SPRAY | RESPIRATORY_TRACT | 5 refills | Status: DC

## 2023-08-07 MED ORDER — MONTELUKAST SODIUM 10 MG PO TABS: 10.0000 mg | ORAL_TABLET | Freq: Every day | ORAL | 5 refills | Status: DC

## 2023-08-07 MED ORDER — FAMOTIDINE 40 MG PO TABS: 40.0000 mg | ORAL_TABLET | Freq: Every evening | ORAL | 5 refills | Status: DC

## 2023-08-07 MED ORDER — ADVAIR HFA 230-21 MCG/ACT IN AERO: 2.0000 | INHALATION_SPRAY | Freq: Two times a day (BID) | RESPIRATORY_TRACT | 5 refills | Status: DC

## 2023-08-07 MED ORDER — PANTOPRAZOLE SODIUM 40 MG PO TBEC: 40.0000 mg | DELAYED_RELEASE_TABLET | Freq: Every morning | ORAL | 5 refills | Status: DC

## 2023-08-07 NOTE — Telephone Encounter (Signed)
Patient called stating she forgot to tell Dr. Lucie Leather at her OV yesterday that every time she finishes eating, she passes a lot of gas.

## 2023-08-07 NOTE — Telephone Encounter (Signed)
See below

## 2023-08-08 ENCOUNTER — Encounter: Payer: Self-pay | Admitting: *Deleted

## 2023-08-13 ENCOUNTER — Telehealth: Payer: Self-pay

## 2023-08-13 NOTE — Telephone Encounter (Signed)
Copied from CRM [id number]. Topic: Clinical - Medical Advice >> Aug 13, 2023  3:19 PM Corin V wrote: Reason for CRM: Patient is having an increase in gas, especially right after she eats. She is currently taking a pill that Dr. Lucie Leather prescribed to help with gas but it is not helping. She is having bloating but no pain. Please call patient back at [phone] with advise or instructions.

## 2023-08-15 ENCOUNTER — Telehealth: Payer: Self-pay

## 2023-08-15 ENCOUNTER — Inpatient Hospital Stay: Payer: Medicare Other | Attending: Hematology and Oncology | Admitting: Hematology and Oncology

## 2023-08-15 VITALS — BP 145/66 | HR 73 | Temp 97.6°F | Resp 20 | Ht 63.0 in | Wt 182.0 lb

## 2023-08-15 DIAGNOSIS — Z923 Personal history of irradiation: Secondary | ICD-10-CM | POA: Diagnosis not present

## 2023-08-15 DIAGNOSIS — Z170 Estrogen receptor positive status [ER+]: Secondary | ICD-10-CM | POA: Insufficient documentation

## 2023-08-15 DIAGNOSIS — Z79811 Long term (current) use of aromatase inhibitors: Secondary | ICD-10-CM | POA: Insufficient documentation

## 2023-08-15 DIAGNOSIS — Z1722 Progesterone receptor negative status: Secondary | ICD-10-CM | POA: Insufficient documentation

## 2023-08-15 DIAGNOSIS — R252 Cramp and spasm: Secondary | ICD-10-CM | POA: Diagnosis not present

## 2023-08-15 DIAGNOSIS — D0512 Intraductal carcinoma in situ of left breast: Secondary | ICD-10-CM | POA: Insufficient documentation

## 2023-08-15 DIAGNOSIS — K117 Disturbances of salivary secretion: Secondary | ICD-10-CM | POA: Insufficient documentation

## 2023-08-15 NOTE — Progress Notes (Signed)
Patient Care Team: Excell Seltzer, MD as PCP - General (Family Medicine) Croitoru, Rachelle Hora, MD as PCP - Cardiology (Cardiology) Lucie Leather, Alvira Philips, MD as Consulting Physician (Allergy and Immunology) Darci Needle, MD as Consulting Physician (Endocrinology) Merwyn Katos, DPM as Consulting Physician (Podiatry) Cherlyn Roberts, MD as Consulting Physician (Dermatology) Aletha Halim, MD as Referring Physician (Specialist) Kin, Elon Jester, DC as Referring Physician (Chiropractic Medicine) Bernette Redbird, MD as Consulting Physician (Gastroenterology) Hallows, Walker Kehr, MD (Orthopedic Surgery) Dohmeier, Porfirio Mylar, MD as Consulting Physician (Neurology) Harriette Bouillon, MD as Consulting Physician (General Surgery) Antony Blackbird, MD as Consulting Physician (Radiation Oncology) Kathyrn Sheriff, [facility] (Inactive) as Pharmacist (Pharmacist) Wenda Overland, [place] as Social Worker Serena Croissant, MD as Consulting Physician (Hematology and Oncology) Alfredo Martinez, MD as Consulting Physician (Urology) Mateo Flow, MD as Consulting Physician (Ophthalmology) Dentistry, [facility]  DIAGNOSIS:  Encounter Diagnosis  Name Primary?   Ductal carcinoma in situ (DCIS) of left breast Yes    SUMMARY OF ONCOLOGIC HISTORY: Oncology History  Ductal carcinoma in situ (DCIS) of left breast  02/19/2022 Initial Diagnosis   Screening mammogram detected left breast asymmetry 1.1 cm oval mass on biopsy revealed intermediate grade DCIS ER 80% weak, PR negative    Genetic Testing   [facility] CustomNext Panel was Negative. Report date is 03/13/2022.  The CustomNext-Cancer+RNAinsight panel offered by [employer] [facility] includes sequencing and rearrangement analysis for the following 47 genes:  APC, ATM, AXIN2, BARD1, BMPR1A, BRCA1, BRCA2, BRIP1, CDH1, CDK4, CDKN2A, CHEK2, CTNNA1, DICER1, EPCAM, GREM1, HOXB13, KIT, MEN1, MLH1, MSH2, MSH3, MSH6, MUTYH, NBN, NF1, NTHL1, PALB2, PDGFRA, PMS2, POLD1, POLE, PTEN, RAD50,  RAD51C, RAD51D, SDHA, SDHB, SDHC, SDHD, SMAD4, SMARCA4, STK11, TP53, TSC1, TSC2, and VHL.  RNA data is routinely analyzed for use in variant interpretation for all genes.   03/29/2022 Surgery   Left lumpectomy: Intermediate grade DCIS, margins negative, 1 cm size, ER 80% weak, PR 0%   05/02/2022 - 05/30/2022 Radiation Therapy   Site Technique Total Dose (Gy) Dose per Fx (Gy) Completed Fx Beam Energies  Breast, Left: Breast_L 3D 40.05/40.05 2.67 15/15 6XFFF  Breast, Left: Breast_L_Bst 3D 12/12 2 6/6 6X     08/2022 -  Anti-estrogen oral therapy   Anastrozole     CHIEF COMPLIANT: Follow-up on anastrozole  HISTORY OF PRESENT ILLNESS:   History of Present Illness   The patient, with breast cancer, presents for a follow-up regarding her treatment and medication management.  She is currently undergoing treatment for breast cancer and has been on anastrozole since August 2023 without any issues. Her recent mammogram in December was satisfactory, and no additional imaging was required. She notes that the breast on which she had surgery and radiation feels harder and fuller compared to the other breast, which she describes as 'limpy'.  She has experienced significant dryness, including dry mouth and dry skin, attributed to medications prescribed by her urologist. The urologist adjusted her medications, introducing a new medication starting with 'G' for a twelve-week trial, which has improved her symptoms. She plans to follow up with the urologist next month to discuss the continuation of this medication.         ALLERGIES:  is allergic to dilantin [phenytoin], latex, oxycodone, penicillins, codeine, hydrocodone, nickel, and pregabalin.  MEDICATIONS:  Current Outpatient Medications  Medication Sig Dispense Refill   ADVAIR HFA 230-21 MCG/ACT inhaler Inhale 2 puffs into the lungs 2 (two) times daily. 2 puffs 1-2 times daily depending on disease activity. 36 g 5   albuterol (  PROVENTIL) (2.5  MG/3ML) 0.083% nebulizer solution Take 3 mLs (2.5 mg total) by nebulization every 6 (six) hours as needed for wheezing or shortness of breath. 150 mL 1   albuterol (VENTOLIN HFA) 108 (90 Base) MCG/ACT inhaler INHALE 2 PUFFS BY MOUTH EVERY 6 HOURS AS NEEDED FOR WHEEZING OR SHORTNESS OF BREATH 18 g 5   anastrozole (ARIMIDEX) 1 MG tablet TAKE 1 TABLET(1 MG) BY MOUTH DAILY 90 tablet 3   atorvastatin (LIPITOR) 10 MG tablet TAKE 1 TABLET(10 MG) BY MOUTH DAILY 90 tablet 3   Blood Glucose Monitoring Suppl (ONETOUCH VERIO) w/Device KIT Use to check blood sugar up to 2 times a day 1 kit 0   cyclobenzaprine (FLEXERIL) 10 MG tablet Take 0.5-1 tablets (5-10 mg total) by mouth at bedtime as needed for muscle spasms. 20 tablet 0   desloratadine (CLARINEX) 5 MG tablet TAKE 1 TABLET(5 MG) BY MOUTH DAILY 90 tablet 1   EPINEPHrine 0.3 mg/0.3 mL IJ SOAJ injection USE AS DIRECTED FOR LIFE THREATENING ALLERGIC REACTIONS 2 each 1   estradiol (ESTRACE) 0.1 MG/GM vaginal cream Place 1 Applicatorful vaginally 2 (two) times a week. (As Needed)     famotidine (PEPCID) 40 MG tablet Take 1 tablet (40 mg total) by mouth at bedtime. Take 1 (ONE) tablet by mouth every evening. 90 tablet 5   fluconazole (DIFLUCAN) 150 MG tablet Take one pill by mouth now and repeat dose in 3 days 2 tablet 0   fluticasone (FLOVENT HFA) 220 MCG/ACT inhaler Inhale 2 puffs into the lungs 2 (two) times daily. 2 puffs 2 times daily during flare up. 36 g 5   gabapentin (NEURONTIN) 300 MG capsule Take one capsule in the morning, one at lunch and two at bedtime 360 capsule 3   glucose blood (ONETOUCH VERIO) test strip 1 strip 2 (two) times daily     GLYXAMBI 10-5 MG TABS TAKE 1 TABLET BY MOUTH DAILY 30 tablet 5   hydrOXYzine (ATARAX) 10 MG tablet Take 1 tablet (10 mg total) by mouth daily as needed for itching. 30 tablet 2   ipratropium (ATROVENT) 0.06 % nasal spray Place 2 sprays into both nostrils 2 (two) times daily as needed for rhinitis. 45 mL 1    irbesartan (AVAPRO) 150 MG tablet TAKE 1 TABLET(150 MG) BY MOUTH DAILY 90 tablet 2   Lactase (DAIRY-RELIEF PO) Take 1 capsule by mouth daily as needed (eating dairy).     Lancets (ONETOUCH DELICA PLUS LANCET33G) MISC USE TO CHECK BLOOD SUGAR UP TO TWICE DAILY 100 each 5   levalbuterol (XOPENEX) 1.25 MG/3ML nebulizer solution USE 1 VIAL VIA NEBULIZER EVERY 6 HOURS AS NEEDED FOR SHORTNESS OF BREATH OR WHEEZING 90 mL 3   Magnesium 500 MG TABS Take 500 mg by mouth 2 (two) times daily.     Menthol, Topical Analgesic, (ABSORBINE PLUS JR EX) Apply 1 patch topically daily as needed (pain).     Menthol-Methyl Salicylate (SALONPAS JET SPRAY EX) Apply 1 spray topically daily as needed (knee pain).     methimazole (TAPAZOLE) 5 MG tablet Take 5 mg by mouth daily.     montelukast (SINGULAIR) 10 MG tablet Take 1 tablet (10 mg total) by mouth at bedtime. For asthma control. 90 tablet 5   Mouthwashes (BIOTENE DRY MOUTH MT) Use as directed 1 Dose in the mouth or throat daily as needed (dry mouth).     Nebulizer MISC 1 Device by Does not apply route as directed. 1 each 1   omeprazole (PRILOSEC)  40 MG capsule Take 40 mg by mouth every morning.     ondansetron (ZOFRAN-ODT) 4 MG disintegrating tablet Take 1 tablet (4 mg total) by mouth every 8 (eight) hours as needed for nausea or vomiting. 20 tablet 0   pantoprazole (PROTONIX) 40 MG tablet Take 1 tablet (40 mg total) by mouth every morning. 90 tablet 5   Plecanatide (TRULANCE) 3 MG TABS Take 3 mg by mouth daily as needed (constipation).     Polyethyl Glycol-Propyl Glycol (SYSTANE OP) Place 1 drop into both eyes daily as needed (dry eyes).     polyethylene glycol powder (GLYCOLAX/MIRALAX) 17 GM/SCOOP powder Take 1 Container by mouth once.     PRESCRIPTION MEDICATION Inhale into the lungs at bedtime. CPAP     Respiratory Therapy Supplies (NEBULIZER MASK ADULT) MISC 1 kit by Does not apply route as directed. 1 each 1   rivaroxaban (XARELTO) 20 MG TABS tablet Take 1  tablet (20 mg total) by mouth daily with supper. 90 tablet 1   triamcinolone (NASACORT) 55 MCG/ACT AERO nasal inhaler Place 2 sprays into the nose daily. 50.7 mL 1   Vibegron (GEMTESA) 75 MG TABS Take 75 mg by mouth daily.     Current Facility-Administered Medications  Medication Dose Route Frequency Provider Last Rate Last Admin   tezepelumab-ekko (TEZSPIRE) 210 MG/1. syringe 210 mg  210 mg Subcutaneous Q28 days Jessica Priest, MD   210 mg at 07/30/23 1014    PHYSICAL EXAMINATION: ECOG PERFORMANCE STATUS: 1 - Symptomatic but completely ambulatory  Vitals:   08/15/23 1038 08/15/23 1040  BP: (!) 152/79 (!) 145/66  Pulse: 73   Resp: 20   Temp: 97.6 F (36.4 C)   SpO2: 100%    Filed Weights   08/15/23 1038  Weight: 182 lb (82.6 kg)    Physical Exam   BREAST: Right breast firmer and fuller compared to left, consistent with post-radiation changes.      (exam performed in the presence of a chaperone)  LABORATORY DATA:  I have reviewed the data as listed    Latest Ref Rng & Units 07/04/2023    4:10 PM 04/26/2023    9:43 AM 04/24/2022   10:03 AM  CMP  Glucose 70 - 99 mg/dL 90  91  91   BUN 8 - 23 mg/dL 12  9  13    Creatinine 0.44 - 1.00 mg/dL 6.04  5.40  9.81   Sodium 135 - 145 mmol/L 139  141  140   Potassium 3.5 - 5.1 mmol/L 4.5  4.3  4.2   Chloride 98 - 111 mmol/L 104  105  106   CO2 22 - 32 mmol/L 28  29  26    Calcium 8.9 - 10.3 mg/dL 9.3  9.2  9.3   Total Protein 6.0 - 8.3 g/dL  5.7  7.1   Total Bilirubin 0.2 - 1.2 mg/dL  1.5  0.8   Alkaline Phos 39 - 117 U/L  81  88   AST 0 - 37 U/L  20  17   ALT 0 - 35 U/L  12  9     Lab Results  Component Value Date   WBC 6.7 07/04/2023   HGB 11.6 (L) 07/04/2023   HCT 35.1 (L) 07/04/2023   MCV 73.4 (L) 07/04/2023   PLT 244 07/04/2023   NEUTROABS 2.5 02/28/2022    ASSESSMENT & PLAN:  Ductal carcinoma in situ (DCIS) of left breast 02/19/2022: Screening mammogram detected left breast asymmetry 1.1 cm  oval mass on  biopsy revealed intermediate grade DCIS ER 80% weak, PR negative 03/29/2022: Left lumpectomy: Intermediate grade DCIS, margins negative, 1 cm size, ER 80% weak, PR 0% 05/30/2022: Completed adjuvant radiation   Current treatment: Antiestrogen therapy with anastrozole Anastrozole Toxicities: Tolerating it extremely well without any problems or concerns.  She does have leg cramps but these have been there even before starting anastrozole therapy.   Breast Cancer Surveillance: Mammogram: 07/02/2023: Benign, breast density category B Breast Exam 08/15/23: Benign   Return to clinic in 1 year for follow-up ------------------------------------- Assessment and Plan    Breast Cancer (Post-Treatment) Breast cancer survivor post-surgery and radiation therapy, on anastrozole since August 2023 without issues. December mammograms were clear. Noted fibrosis and changes in breast tissue texture due to radiation. - Continue anastrozole - Annual mammogram in December 2025 - Follow-up in January 2026  Xerostomia Significant xerostomia attributed to urologist-prescribed medications. Symptoms improved after medication adjustment. Currently on a new medication starting with 'G'. Follow-up with urologist on August 24, 2023. Emphasized hydration and lozenges for symptom management. - Continue current medication regimen as prescribed by the urologist - Follow up with urologist on August 24, 2023 - Send MyChart message with the name of the new medication  General Health Maintenance Compliant with regular health check-ups, screenings, and medication regimen. - Continue regular health check-ups and screenings - Maintain current medication regimen.          No orders of the defined types were placed in this encounter.  The patient has a good understanding of the overall plan. she agrees with it. she will call with any problems that may develop before the next visit here. Total time spent: 30 mins including  face to face time and time spent for planning, charting and co-ordination of care   Tamsen Meek, MD 08/15/23

## 2023-08-15 NOTE — Telephone Encounter (Signed)
Have her look into FODMAP diet.  This is a diet that decreased

## 2023-08-15 NOTE — Telephone Encounter (Signed)
Sorry about the partial message.   Start fodmap diet ... this is a diet that is low in gas producing foods.  Also can start  probiotic if not already taking to reset the balance of good no gas forming bacteria in bowel... culturell or align.

## 2023-08-15 NOTE — Patient Outreach (Signed)
  Care Coordination   Care coordination  Visit Note   08/15/2023 Name: [name] MRN: [id number] DOB: 11/04/1945  [name] is a 78 y.o. year old female who sees Excell Seltzer, MD for primary care.     Goals Addressed             This Visit's Progress    COMPLETED: Care coordination activities - no follow up needed.       . Interventions Today    Flowsheet Row Most Recent Value  Education Interventions   Education Provided Provided Printed Education  [Re-sent education articles on Heart attack and stroke to patient due to patients voice message stating she did not receive.]              SDOH assessments and interventions completed:  No     Care Coordination Interventions:  Yes, provided   Follow up plan: No further intervention required.   Encounter Outcome:  Patient Visit Completed   George Ina RN, BSN, CCM [place]  [facility], Population Health Case Manager Phone: [phone]

## 2023-08-15 NOTE — Telephone Encounter (Signed)
[name] notified as instructed by telephone.  Patient states understanding.

## 2023-08-15 NOTE — Assessment & Plan Note (Signed)
02/19/2022: Screening mammogram detected left breast asymmetry 1.1 cm oval mass on biopsy revealed intermediate grade DCIS ER 80% weak, PR negative 03/29/2022: Left lumpectomy: Intermediate grade DCIS, margins negative, 1 cm size, ER 80% weak, PR 0% 05/30/2022: Completed adjuvant radiation   Current treatment: Antiestrogen therapy with anastrozole Anastrozole Toxicities: Tolerating it extremely well without any problems or concerns.  She does have leg cramps but these have been there even before starting anastrozole therapy.   Breast Cancer Surveillance: Mammogram: 07/02/2023: Benign, breast density category B Breast Exam 08/15/23: Benign   Return to clinic in 1 year for follow-up

## 2023-08-15 NOTE — Telephone Encounter (Signed)
Below message not completed.  Please advise.

## 2023-08-16 ENCOUNTER — Other Ambulatory Visit: Payer: Self-pay | Admitting: *Deleted

## 2023-08-16 ENCOUNTER — Encounter: Payer: Self-pay | Admitting: *Deleted

## 2023-08-19 ENCOUNTER — Other Ambulatory Visit: Payer: Self-pay | Admitting: Allergy and Immunology

## 2023-08-22 DIAGNOSIS — R350 Frequency of micturition: Secondary | ICD-10-CM | POA: Diagnosis not present

## 2023-08-22 DIAGNOSIS — N3941 Urge incontinence: Secondary | ICD-10-CM | POA: Diagnosis not present

## 2023-08-26 ENCOUNTER — Telehealth: Payer: Self-pay

## 2023-08-26 NOTE — Patient Outreach (Signed)
  Care Coordination   08/26/2023 Name: ELETA KOLBE MRN: [id number] DOB: Apr 12, 1946   Care Coordination Outreach Attempt:  Return call to patient. Unable to reach. HIPAA compliant message left with call back phone number na return call request.   Follow Up Plan:  Additional outreach attempts will be made to offer the patient complex care management information and services.   Encounter Outcome:  No Answer   Care Coordination Interventions:  No, not indicated    Verba Girt RN, BSN, CCM [employer], Population Health Case Manager Phone: [phone]

## 2023-08-27 ENCOUNTER — Ambulatory Visit (INDEPENDENT_AMBULATORY_CARE_PROVIDER_SITE_OTHER): Payer: Medicare Other | Admitting: *Deleted

## 2023-08-27 DIAGNOSIS — J4550 Severe persistent asthma, uncomplicated: Secondary | ICD-10-CM | POA: Diagnosis not present

## 2023-09-03 ENCOUNTER — Encounter: Payer: Self-pay | Admitting: Family Medicine

## 2023-09-03 ENCOUNTER — Ambulatory Visit: Payer: Medicare Other | Admitting: Family Medicine

## 2023-09-03 ENCOUNTER — Telehealth: Payer: Self-pay | Admitting: Allergy and Immunology

## 2023-09-03 VITALS — BP 160/90 | HR 95 | Temp 97.9°F | Ht 63.75 in | Wt 192.1 lb

## 2023-09-03 DIAGNOSIS — J309 Allergic rhinitis, unspecified: Secondary | ICD-10-CM

## 2023-09-03 DIAGNOSIS — G4700 Insomnia, unspecified: Secondary | ICD-10-CM | POA: Diagnosis not present

## 2023-09-03 DIAGNOSIS — J4540 Moderate persistent asthma, uncomplicated: Secondary | ICD-10-CM | POA: Diagnosis not present

## 2023-09-03 MED ORDER — BENZONATATE 200 MG PO CAPS: 200.0000 mg | ORAL_CAPSULE | Freq: Two times a day (BID) | ORAL | 0 refills | Status: DC | PRN

## 2023-09-03 NOTE — Telephone Encounter (Signed)
Patient called stating another provider has prescribed her the same medication pantoprazole (PROTONIX)  but Dr. Lucie Leather is given dosage of 1 tablet every morning the other provider advised to take twice daily patient was asking if Dr. Lucie Leather would advise her to take this medication twice daily asking for a call back from the nurse

## 2023-09-03 NOTE — Patient Instructions (Addendum)
Can try melatonin 3 mg at bedtime.  Return Yoga.  Can try benzonatate for cough prn.

## 2023-09-03 NOTE — Progress Notes (Signed)
 Patient ID: [name], female    DOB: 09-Dec-1945, 78 y.o.   MRN: [id number]  This visit was conducted in person.  BP (!) 160/90 (BP Location: Left Arm, Patient Position: Sitting, Cuff Size: Large)   Pulse 95   Temp 97.9 F (36.6 C) (Temporal)   Ht 5' 3.75" (1.619 m)   Wt 192 lb 2 oz (87.1 kg)   SpO2 99%   BMI 33.24 kg/m    CC:  Chief Complaint  Patient presents with   Cough    X 2 weeks   Nasal Congestion   Sore on Right Foot   Insomnia    Subjective:   HPI: [name] is a 78 y.o. female presenting on 09/03/2023 for Cough (X 2 weeks), Nasal Congestion, Sore on Right Foot, and Insomnia   Date of onset: 2 weeks Initial symptoms included  runny nose, sneeze Symptoms progressed to cough, occ productive in AMs... occ brown, not new.  No fever.  No SOB, no Wheeze.   Sick contacts:  none COVID testing:   none     She has tried to treat with  ipratropium  Occ using nebulizer. A.  Advair 230 - 2 inhalations 2 times a day (empty lungs)             B.  Montelukast 10mg  - 1 tablet 1 time per day.             C.  Tezepelumab injections.      Plans to increase patoprazole to BID to help with GI issue and mucus in throat.   History of pulmonary hypertension, moderate persistent asthma, DM and OSA,  Non-smoker.  Reviewed last OV with Dr. Lucie Leather 08/06/2023    Issues with chronic insomnia: some trouble falling asleepx 1 week.. denies anxiety  No napping. She has not started yoga back yet.  Relevant past medical, surgical, family and social history reviewed and updated as indicated. Interim medical history since our last visit reviewed. Allergies and medications reviewed and updated. Outpatient Medications Prior to Visit  Medication Sig Dispense Refill   anastrozole (ARIMIDEX) 1 MG tablet TAKE 1 TABLET(1 MG) BY MOUTH DAILY 90 tablet 3   atorvastatin (LIPITOR) 10 MG tablet TAKE 1 TABLET(10 MG) BY MOUTH DAILY 90 tablet 3   Blood Glucose Monitoring Suppl (ONETOUCH  VERIO) w/Device KIT Use to check blood sugar up to 2 times a day 1 kit 0   cyclobenzaprine (FLEXERIL) 10 MG tablet Take 0.5-1 tablets (5-10 mg total) by mouth at bedtime as needed for muscle spasms. 20 tablet 0   desloratadine (CLARINEX) 5 MG tablet TAKE 1 TABLET(5 MG) BY MOUTH DAILY 90 tablet 1   estradiol (ESTRACE) 0.1 MG/GM vaginal cream Place 1 Applicatorful vaginally 2 (two) times a week. (As Needed)     fluconazole (DIFLUCAN) 150 MG tablet Take one pill by mouth now and repeat dose in 3 days 2 tablet 0   gabapentin (NEURONTIN) 300 MG capsule Take one capsule in the morning, one at lunch and two at bedtime 360 capsule 3   glucose blood (ONETOUCH VERIO) test strip 1 strip 2 (two) times daily     GLYXAMBI 10-5 MG TABS TAKE 1 TABLET BY MOUTH DAILY 30 tablet 5   hydrOXYzine (ATARAX) 10 MG tablet Take 1 tablet (10 mg total) by mouth daily as needed for itching. 30 tablet 2   ipratropium (ATROVENT) 0.06 % nasal spray Place 2 sprays into both nostrils 2 (two) times daily as needed  for rhinitis. 45 mL 1   irbesartan (AVAPRO) 150 MG tablet TAKE 1 TABLET(150 MG) BY MOUTH DAILY 90 tablet 2   Lactase (DAIRY-RELIEF PO) Take 1 capsule by mouth daily as needed (eating dairy).     Lancets (ONETOUCH DELICA PLUS LANCET33G) MISC USE TO CHECK BLOOD SUGAR UP TO TWICE DAILY 100 each 5   Magnesium 500 MG TABS Take 500 mg by mouth 2 (two) times daily.     Menthol, Topical Analgesic, (ABSORBINE PLUS JR EX) Apply 1 patch topically daily as needed (pain).     Menthol-Methyl Salicylate (SALONPAS JET SPRAY EX) Apply 1 spray topically daily as needed (knee pain).     methimazole (TAPAZOLE) 5 MG tablet Take 5 mg by mouth daily.     Mouthwashes (BIOTENE DRY MOUTH MT) Use as directed 1 Dose in the mouth or throat daily as needed (dry mouth).     Nebulizer MISC 1 Device by Does not apply route as directed. 1 each 1   ondansetron (ZOFRAN-ODT) 4 MG disintegrating tablet Take 1 tablet (4 mg total) by mouth every 8 (eight) hours  as needed for nausea or vomiting. 20 tablet 0   Plecanatide (TRULANCE) 3 MG TABS Take 3 mg by mouth daily as needed (constipation).     Polyethyl Glycol-Propyl Glycol (SYSTANE OP) Place 1 drop into both eyes daily as needed (dry eyes).     polyethylene glycol powder (GLYCOLAX/MIRALAX) 17 GM/SCOOP powder Take 1 Container by mouth once.     PRESCRIPTION MEDICATION Inhale into the lungs at bedtime. CPAP     Respiratory Therapy Supplies (NEBULIZER MASK ADULT) MISC 1 kit by Does not apply route as directed. 1 each 1   rivaroxaban (XARELTO) 20 MG TABS tablet Take 1 tablet (20 mg total) by mouth daily with supper. 90 tablet 1   triamcinolone (NASACORT) 55 MCG/ACT AERO nasal inhaler Place 2 sprays into the nose daily. 50.7 mL 1   Vibegron (GEMTESA) 75 MG TABS Take 75 mg by mouth daily.     ADVAIR HFA 230-21 MCG/ACT inhaler Inhale 2 puffs into the lungs 2 (two) times daily. 2 puffs 1-2 times daily depending on disease activity. 36 g 5   albuterol (PROVENTIL) (2.5 MG/3ML) 0.083% nebulizer solution Take 3 mLs (2.5 mg total) by nebulization every 6 (six) hours as needed for wheezing or shortness of breath. 150 mL 1   albuterol (VENTOLIN HFA) 108 (90 Base) MCG/ACT inhaler INHALE 2 PUFFS BY MOUTH EVERY 6 HOURS AS NEEDED FOR WHEEZING OR SHORTNESS OF BREATH 18 g 5   EPINEPHrine 0.3 mg/0.3 mL IJ SOAJ injection USE AS DIRECTED FOR LIFE THREATENING ALLERGIC REACTIONS 2 each 1   famotidine (PEPCID) 40 MG tablet Take 1 tablet (40 mg total) by mouth at bedtime. Take 1 (ONE) tablet by mouth every evening. 90 tablet 5   fluticasone (FLOVENT HFA) 220 MCG/ACT inhaler Inhale 2 puffs into the lungs 2 (two) times daily. 2 puffs 2 times daily during flare up. 36 g 5   levalbuterol (XOPENEX) 1.25 MG/3ML nebulizer solution USE 1 VIAL VIA NEBULIZER EVERY 6 HOURS AS NEEDED FOR SHORTNESS OF BREATH OR WHEEZING 90 mL 3   montelukast (SINGULAIR) 10 MG tablet Take 1 tablet (10 mg total) by mouth at bedtime. For asthma control. 90 tablet 5    pantoprazole (PROTONIX) 40 MG tablet Take 1 tablet (40 mg total) by mouth every morning. 90 tablet 5   Facility-Administered Medications Prior to Visit  Medication Dose Route Frequency Provider Last Rate Last Admin  tezepelumab-ekko (TEZSPIRE) 210 MG/1. syringe 210 mg  210 mg Subcutaneous Q28 days Jessica Priest, MD   210 mg at 09/24/23 1529     Per HPI unless specifically indicated in ROS section below Review of Systems  All other systems reviewed and are negative.  Objective:  BP (!) 160/90 (BP Location: Left Arm, Patient Position: Sitting, Cuff Size: Large)   Pulse 95   Temp 97.9 F (36.6 C) (Temporal)   Ht 5' 3.75" (1.619 m)   Wt 192 lb 2 oz (87.1 kg)   SpO2 99%   BMI 33.24 kg/m   Wt Readings from Last 3 Encounters:  09/24/23 193 lb 4.8 oz (87.7 kg)  09/03/23 192 lb 2 oz (87.1 kg)  08/15/23 182 lb (82.6 kg)      Physical Exam Constitutional:      General: She is not in acute distress.    Appearance: She is well-developed. She is obese. She is not ill-appearing or toxic-appearing.  HENT:     Head: Normocephalic.     Right Ear: Hearing, tympanic membrane, ear canal and external ear normal. Tympanic membrane is not erythematous, retracted or bulging.     Left Ear: Hearing, tympanic membrane, ear canal and external ear normal. Tympanic membrane is not erythematous, retracted or bulging.     Nose: Mucosal edema and rhinorrhea present.     Right Sinus: No maxillary sinus tenderness or frontal sinus tenderness.     Left Sinus: No maxillary sinus tenderness or frontal sinus tenderness.     Mouth/Throat:     Pharynx: Uvula midline.  Eyes:     General: Lids are normal. Lids are everted, no foreign bodies appreciated.     Conjunctiva/sclera: Conjunctivae normal.     Pupils: Pupils are equal, round, and reactive to light.  Neck:     Thyroid: No thyroid mass or thyromegaly.     Vascular: No carotid bruit.     Trachea: Trachea normal.  Cardiovascular:     Rate and  Rhythm: Normal rate and regular rhythm.     Pulses: Normal pulses.     Heart sounds: Normal heart sounds, S1 normal and S2 normal. No murmur heard.    No friction rub. No gallop.  Pulmonary:     Effort: Pulmonary effort is normal. No tachypnea or respiratory distress.     Breath sounds: Normal breath sounds. No decreased breath sounds, wheezing, rhonchi or rales.  Musculoskeletal:     Cervical back: Normal range of motion and neck supple.  Skin:    General: Skin is warm and dry.     Findings: No rash.  Neurological:     Mental Status: She is alert.  Psychiatric:        Mood and Affect: Mood is not anxious or depressed.        Speech: Speech normal.        Behavior: Behavior normal. Behavior is cooperative.        Judgment: Judgment normal.       Results for orders placed or performed in visit on 08/02/23  POCT glycosylated hemoglobin (Hb A1C)   Collection Time: 08/02/23 12:07 PM  Result Value Ref Range   Hemoglobin A1C 5.6 4.0 - 5.6 %   HbA1c POC (<> result, manual entry)     HbA1c, POC (prediabetic range)     HbA1c, POC (controlled diabetic range)     *Note: Due to a large number of results and/or encounters for the requested time period, some results have  not been displayed. A complete set of results can be found in Results Review.    Assessment and Plan  Acute insomnia Assessment & Plan: Acute, intermittent not persistent She will try melatonin as well as increase daytime exercise to improve sleep.  melatonin 3 mg at bedtime.  Return  to Yoga.   Allergic sinusitis Assessment & Plan: Chronic with acute worsening in the last 2 weeks.  Likely secondary to allergies.  She is currently taking montelukast 10 mg 1 tablet daily. She is requesting a medication to help with production of mucus.  We will treat with benzonatate 200 mg twice daily as needed. No red flags.  Return and ER precautions provided.   Asthma, moderate persistent Assessment & Plan: Chronic,  stable control on Advair.  No current asthma exacerbation.   Other orders -     Benzonatate; Take 1 capsule (200 mg total) by mouth 2 (two) times daily as needed for cough.  Dispense: 20 capsule; Refill: 0    No follow-ups on file.   Kerby Nora, MD

## 2023-09-05 ENCOUNTER — Other Ambulatory Visit: Payer: Self-pay | Admitting: Allergy and Immunology

## 2023-09-06 ENCOUNTER — Telehealth: Payer: Self-pay

## 2023-09-06 ENCOUNTER — Telehealth: Payer: Self-pay | Admitting: Allergy and Immunology

## 2023-09-06 ENCOUNTER — Other Ambulatory Visit: Payer: Self-pay

## 2023-09-06 MED ORDER — ALBUTEROL SULFATE (2.5 MG/3ML) 0.083% IN NEBU: 2.5000 mg | INHALATION_SOLUTION | Freq: Four times a day (QID) | RESPIRATORY_TRACT | 1 refills | Status: DC | PRN

## 2023-09-06 MED ORDER — LEVALBUTEROL HCL 1.25 MG/3ML IN NEBU: 1.2500 mg | INHALATION_SOLUTION | RESPIRATORY_TRACT | 5 refills | Status: DC | PRN

## 2023-09-06 NOTE — Telephone Encounter (Signed)
Patient called stating she needs a refill on the albuterol for her Nebulizer. Patient's pharmacy is [facility] in [place].

## 2023-09-06 NOTE — Telephone Encounter (Signed)
[facility]

## 2023-09-06 NOTE — Telephone Encounter (Signed)
Sent in albuterol neb solution to [facility] [place]

## 2023-09-06 NOTE — Patient Outreach (Signed)
Care Coordination   Follow Up Visit Note   09/06/2023 Name: [name] MRN: [id number] DOB: 05-02-1946  [name] is a 78 y.o. year old female who sees Alicia Seltzer, MD for primary care. I spoke with  [name] by phone today.  What matters to the patients health and wellness today?  Patient states she is having some difficulty with sleep. She states she sometimes stays up until 4 am.  Patient denies having any caffiene or stimulant type food or drink in the late evening.  She states she plans to restart her exercises/ yoga classes in a couple of weeks.  Patient agreeable to meeting with RNCM on 09/12/23 to received printed education information on heart attack, stroke and sleep hygiene.    Goals Addressed             This Visit's Progress    Health maintenance       Interventions Today    Flowsheet Row Most Recent Value  Chronic Disease   Chronic disease during today's visit Other  [difficulty sleeping]  General Interventions   General Interventions Discussed/Reviewed General Interventions Reviewed  [Discussed sleep hygiene.  Explained circadium rhythm. Advised not to drink caffiene/ stimulants prior to bed.  Advised when waking up in the morning open blinds/ curtains to let in sunlight.]  Education Interventions   Education Provided Provided Printed Education  [scheduled in office visit to provide patient printed education information on heart attack, stroke, and sleep hygiene.]              SDOH assessments and interventions completed:  No     Care Coordination Interventions:  Yes, provided   Follow up plan: Follow up call scheduled for 09/12/23 at 10 am    Encounter Outcome:  Patient Visit Completed   George Ina RN, BSN, CCM [place]  [facility], Population Health Case Manager Phone: [phone]

## 2023-09-06 NOTE — Patient Instructions (Signed)
Visit Information  Thank you for taking time to visit with me today. Please don't hesitate to contact me if I can be of assistance to you.   Following are the goals we discussed today:   Goals Addressed             This Visit's Progress    Health maintenance       Interventions Today    Flowsheet Row Most Recent Value  Chronic Disease   Chronic disease during today's visit Other  [difficulty sleeping]  General Interventions   General Interventions Discussed/Reviewed General Interventions Reviewed  [Discussed sleep hygiene.  Explained circadium rhythm. Advised not to drink caffiene/ stimulants prior to bed.  Advised when waking up in the morning open blinds/ curtains to let in sunlight.]  Education Interventions   Education Provided Provided Printed Education  [scheduled in office visit to provide patient printed education information on heart attack, stroke, and sleep hygiene.]              Our next appointment is by telephone on 09/12/23 at 10 am  Please call the care guide team at [phone] if you need to cancel or reschedule your appointment.   If you are experiencing a Mental Health or Behavioral Health Crisis or need someone to talk to, please call the Suicide and Crisis Lifeline: 988 call 1-800-273-TALK (toll free, 24 hour hotline)  Patient verbalizes understanding of instructions and care plan provided today and agrees to view in MyChart. Active MyChart status and patient understanding of how to access instructions and care plan via MyChart confirmed with patient.     George Ina RN, BSN, CCM [employer], Population Health Case Manager Phone: [phone]

## 2023-09-12 ENCOUNTER — Ambulatory Visit: Payer: Self-pay

## 2023-09-12 NOTE — Patient Outreach (Signed)
 Care Coordination   In Person Provider Office Visit Note   09/12/2023 Name: [name] MRN: [id number] DOB: 30-Apr-1946  [name] is a 78 y.o. year old female who sees Excell Seltzer, MD for primary care. I spoke with  Gwynneth Albright by phone today.  What matters to the patients health and wellness today?  Patient states she wants information regarding how to recognize and prevent heart attack and stroke.     Goals Addressed             This Visit's Progress    COMPLETED: Increase knowledge related to heart attack and stroke       Interventions Today    Flowsheet Row Most Recent Value  General Interventions   General Interventions Discussed/Reviewed General Interventions Reviewed  [Education article on heart attack and stroke given to patient and reviewed.  Advised to contact RN case manager for questions  once education articles reviewed.]              SDOH assessments and interventions completed:  No     Care Coordination Interventions:  Yes, provided   Follow up plan: No further intervention required.   Encounter Outcome:  Patient Visit Completed   George Ina RN, BSN, CCM [place]  [facility], Population Health Case Manager Phone: [phone]

## 2023-09-19 DIAGNOSIS — H40013 Open angle with borderline findings, low risk, bilateral: Secondary | ICD-10-CM | POA: Diagnosis not present

## 2023-09-19 DIAGNOSIS — H25813 Combined forms of age-related cataract, bilateral: Secondary | ICD-10-CM | POA: Diagnosis not present

## 2023-09-19 DIAGNOSIS — E119 Type 2 diabetes mellitus without complications: Secondary | ICD-10-CM | POA: Diagnosis not present

## 2023-09-19 DIAGNOSIS — H04123 Dry eye syndrome of bilateral lacrimal glands: Secondary | ICD-10-CM | POA: Diagnosis not present

## 2023-09-19 DIAGNOSIS — H524 Presbyopia: Secondary | ICD-10-CM | POA: Diagnosis not present

## 2023-09-19 LAB — HM DIABETES EYE EXAM

## 2023-09-21 ENCOUNTER — Telehealth: Payer: Self-pay | Admitting: Allergy

## 2023-09-21 MED ORDER — PREDNISONE 20 MG PO TABS
20.0000 mg | ORAL_TABLET | Freq: Every day | ORAL | 0 refills | Status: DC
Start: 2023-09-21 — End: 2023-11-22

## 2023-09-21 MED ORDER — BUDESONIDE 0.5 MG/2ML IN SUSP: 0.5000 mg | Freq: Two times a day (BID) | RESPIRATORY_TRACT | 0 refills | Status: DC

## 2023-09-21 NOTE — Telephone Encounter (Signed)
 Patient called stating   Maintenance inhaler is Advair 2 puffs twice a day.  Using albuterol HFA 2 puffs every 6 hours for a few days due to asthma flare.  She is still wheezing and coughing. Denies any fevers/chills or feeling ill.  Using xopenex nebulizer machine twice a day with minimal benefit.  Lives alone - hoping that someone can pick up her medication. No recent steroids for asthma but had steroids for something else.  She does have diabetes and htn. Patient scheduled to get her biologic injection on Tuesday.  Advised patient to start taking prednisone 20mg  daily x 5 days for now. Add on budesonide nebulizer twice a day to her regular regimen. May use rescue inhaler as needed every 4-6 hours.  I'll have the office call her to see if she can be seen.  If not feeling well over the weekend advised her to seek care at [facility] or ER for further evaluation.   I asked if she has mychart but apparently she can't access it as I was going to send her instructions that way.  I reiterated the plan multiple times over the phone and patient states she understands the plan.

## 2023-09-23 ENCOUNTER — Telehealth: Payer: Self-pay

## 2023-09-23 ENCOUNTER — Encounter: Payer: Self-pay | Admitting: Podiatry

## 2023-09-23 ENCOUNTER — Ambulatory Visit (INDEPENDENT_AMBULATORY_CARE_PROVIDER_SITE_OTHER): Payer: Medicare Other | Admitting: Podiatry

## 2023-09-23 DIAGNOSIS — B351 Tinea unguium: Secondary | ICD-10-CM | POA: Diagnosis not present

## 2023-09-23 DIAGNOSIS — E1142 Type 2 diabetes mellitus with diabetic polyneuropathy: Secondary | ICD-10-CM | POA: Diagnosis not present

## 2023-09-23 DIAGNOSIS — L84 Corns and callosities: Secondary | ICD-10-CM

## 2023-09-23 DIAGNOSIS — M79676 Pain in unspecified toe(s): Secondary | ICD-10-CM

## 2023-09-23 DIAGNOSIS — Q828 Other specified congenital malformations of skin: Secondary | ICD-10-CM | POA: Diagnosis not present

## 2023-09-23 MED ORDER — BUDESONIDE 0.5 MG/2ML IN SUSP: 0.5000 mg | Freq: Two times a day (BID) | RESPIRATORY_TRACT | 0 refills | Status: DC

## 2023-09-23 NOTE — Telephone Encounter (Signed)
 I have resent prescription with code and request to file under Medicare part B.

## 2023-09-23 NOTE — Telephone Encounter (Signed)
 Patient called in - f/u appt has been scheduled for 09/24/23 @ 10 am w/Dr. Lucie Leather.

## 2023-09-23 NOTE — Telephone Encounter (Signed)
*  Asthma/Allergy  Pharmacy Patient Advocate Encounter  Received notification from [facility] that Prior Authorization for Budesonide 0.5MG /2ML suspension  has been CANCELLED due to medication is to be processed under Medicare Part B   PA #/Case ID/Reference #: [id number]

## 2023-09-24 ENCOUNTER — Ambulatory Visit: Admitting: Allergy and Immunology

## 2023-09-24 ENCOUNTER — Telehealth: Payer: Self-pay

## 2023-09-24 ENCOUNTER — Ambulatory Visit: Payer: Federal, State, Local not specified - PPO

## 2023-09-24 VITALS — BP 140/76 | HR 75 | Temp 98.7°F | Resp 14 | Ht 63.0 in | Wt 193.3 lb

## 2023-09-24 DIAGNOSIS — J3089 Other allergic rhinitis: Secondary | ICD-10-CM | POA: Diagnosis not present

## 2023-09-24 DIAGNOSIS — R1310 Dysphagia, unspecified: Secondary | ICD-10-CM

## 2023-09-24 DIAGNOSIS — J4550 Severe persistent asthma, uncomplicated: Secondary | ICD-10-CM

## 2023-09-24 DIAGNOSIS — R490 Dysphonia: Secondary | ICD-10-CM | POA: Diagnosis not present

## 2023-09-24 DIAGNOSIS — K219 Gastro-esophageal reflux disease without esophagitis: Secondary | ICD-10-CM | POA: Diagnosis not present

## 2023-09-24 DIAGNOSIS — Z91018 Allergy to other foods: Secondary | ICD-10-CM | POA: Diagnosis not present

## 2023-09-24 MED ORDER — PANTOPRAZOLE SODIUM 40 MG PO TBEC: 40.0000 mg | DELAYED_RELEASE_TABLET | Freq: Two times a day (BID) | ORAL | 1 refills | Status: DC

## 2023-09-24 MED ORDER — FLUTICASONE PROPIONATE HFA 220 MCG/ACT IN AERO: 2.0000 | INHALATION_SPRAY | Freq: Two times a day (BID) | RESPIRATORY_TRACT | 1 refills | Status: DC

## 2023-09-24 MED ORDER — EPINEPHRINE 0.3 MG/0.3ML IJ SOAJ: INTRAMUSCULAR | 1 refills | Status: DC

## 2023-09-24 MED ORDER — FAMOTIDINE 40 MG PO TABS: 40.0000 mg | ORAL_TABLET | Freq: Every evening | ORAL | 1 refills | Status: DC

## 2023-09-24 MED ORDER — ALBUTEROL SULFATE HFA 108 (90 BASE) MCG/ACT IN AERS: INHALATION_SPRAY | RESPIRATORY_TRACT | 5 refills | Status: DC

## 2023-09-24 MED ORDER — ALBUTEROL SULFATE (2.5 MG/3ML) 0.083% IN NEBU: 2.5000 mg | INHALATION_SOLUTION | Freq: Four times a day (QID) | RESPIRATORY_TRACT | 1 refills | Status: DC | PRN

## 2023-09-24 MED ORDER — ADVAIR HFA 230-21 MCG/ACT IN AERO: 2.0000 | INHALATION_SPRAY | Freq: Two times a day (BID) | RESPIRATORY_TRACT | 5 refills | Status: DC

## 2023-09-24 MED ORDER — EPINEPHRINE 0.3 MG/0.3ML IJ SOAJ: 0.3000 mg | INTRAMUSCULAR | 1 refills | Status: DC | PRN

## 2023-09-24 MED ORDER — MONTELUKAST SODIUM 10 MG PO TABS: 10.0000 mg | ORAL_TABLET | Freq: Every day | ORAL | 1 refills | Status: DC

## 2023-09-24 NOTE — Telephone Encounter (Signed)
 Please refer to ENT for evaluation of throat per Dr. Lucie Leather

## 2023-09-24 NOTE — Patient Instructions (Addendum)
  1. Continue to treat and prevent airway inflammation:  A.  Advair 230 - 2 inhalations 2 times a day (empty lungs)  B.  Montelukast 10mg  - 1 tablet 1 time per day.  C.  Tezepelumab injections.    2. Continue to treat reflux / swallowing issue:  A. INCREASE Pantoprazole 40mg  - 2 times per day B. Famotidine 40 mg in PM C. Replace throat clearing with swallowing / drinking maneuver D. Evaluation of throat with ENT E. Modified barium swallow   3. If needed:   A. Nasal saline several times per day  B. Albuterol + Fluticasone 220 - 2 inhalations TOGETHER every 6 hours  C. Epi-pen    4. Return to clinic in 12 weeks or earlier if there is a problem.  5. Influenza = Tamiflu. Covid = molnupiravir

## 2023-09-24 NOTE — Progress Notes (Unsigned)
 [place] [facility] [place] - [place] - [place] - [place]   Follow-up Note  Referring Provider: Excell Seltzer, MD Primary Provider: Excell Seltzer, MD Date of Office Visit: 09/24/2023  Subjective:   [name] (DOB: 12-04-1945) is a 78 y.o. female who [facility] in re-evaluation of the following:  HPI: [name] returns to this clinic in evaluation of asthma, history of pulmonary hypertension/diastolic dysfunction, allergic rhinitis, LPR, esophageal dysfunction, food allergy directed against shellfish and fish.  I last saw her in this clinic 06 August 2023.  The active issue for her at this point in time is the same issue we addressed her for during her last visit.  She has tons of throat clearing and mucus stuck in her throat and she attempts to not throat clear and to drink to swallow the mucus in her throat but it never appears to clear that issue and then she gets raspy talking and then she has to throat clear.  As well, she has noticed a lot of wheezing.  The wheezing is definitely coming from her throat.  She has been given a course of prednisone this past weekend by one of our doctors and this did not really help her wheezing.  She is been using a nebulizer twice a day which has not really helped her wheezing.  She thinks her reflux is under okay control at this point in time while using pantoprazole in the morning and famotidine in the evening.  She has not really had much problems with her upper or lower airway other than the "wheezing" that she describes that appears to be coming from her throat.  She does not consume fish or shellfish.  Allergies as of 09/24/2023       Reactions   Dilantin [phenytoin] Swelling   facial swelling   Latex Hives   Oxycodone Nausea And Vomiting, Nausea Only   Abdominal Pain, Vomiting   Penicillins Nausea And Vomiting, Swelling   Has patient had a PCN reaction causing immediate rash,  facial/tongue/throat swelling, SOB or lightheadedness with hypotension patient had a PCN reaction causing severe rash involving mucus membranes or skin necrosis: [id number]} Has patient had a PCN reaction that required hospitalization/No Has patient had a PCN reaction occurring within the last 10 years: No If all of the above answers are "NO", then may proceed with Cephalosporin use.   Codeine Nausea Only   Tolerates tylenol with codeine   Hydrocodone Nausea And Vomiting   Nickel Hives, Rash   Pregabalin Itching, Other (See Comments)   headaches/problem w/vision        Medication List    ABSORBINE PLUS JR EX Apply 1 patch topically daily as needed (pain).   Advair HFA 230-21 MCG/ACT inhaler Generic drug: fluticasone-salmeterol Inhale 2 puffs into the lungs 2 (two) times daily. 2 puffs 1-2 times daily depending on disease activity.   albuterol 108 (90 Base) MCG/ACT inhaler Commonly known as: VENTOLIN HFA INHALE 2 PUFFS BY MOUTH EVERY 6 HOURS AS NEEDED FOR WHEEZING OR SHORTNESS OF BREATH   albuterol (2.5 MG/3ML) 0.083% nebulizer solution Commonly known as: PROVENTIL Take 3 mLs (2.5 mg total) by nebulization every 6 (six) hours as needed for wheezing or shortness of breath.   anastrozole 1 MG tablet Commonly known as: ARIMIDEX TAKE 1 TABLET(1 MG) BY MOUTH DAILY   atorvastatin 10 MG tablet Commonly known as: LIPITOR TAKE 1 TABLET(10 MG) BY MOUTH DAILY   benzonatate 200 MG capsule  Commonly known as: TESSALON Take 1 capsule (200 mg total) by mouth 2 (two) times daily as needed for cough.   BIOTENE DRY MOUTH MT Use as directed 1 Dose in the mouth or throat daily as needed (dry mouth).   budesonide 0.5 MG/2ML nebulizer solution Commonly known as: Pulmicort Take 2 mLs (0.5 mg total) by nebulization in the morning and at bedtime.   cyclobenzaprine 10 MG tablet Commonly known as: FLEXERIL Take 0.5-1 tablets (5-10 mg total) by mouth at bedtime as needed for muscle spasms.    DAIRY-RELIEF PO Take 1 capsule by mouth daily as needed (eating dairy).   desloratadine 5 MG tablet Commonly known as: CLARINEX TAKE 1 TABLET(5 MG) BY MOUTH DAILY   EPINEPHrine 0.3 mg/0.3 mL Soaj injection Commonly known as: EPI-PEN USE AS DIRECTED FOR LIFE THREATENING ALLERGIC REACTIONS   estradiol 0.1 MG/GM vaginal cream Commonly known as: ESTRACE Place 1 Applicatorful vaginally 2 (two) times a week. (As Needed)   famotidine 40 MG tablet Commonly known as: PEPCID Take 1 tablet (40 mg total) by mouth at bedtime. Take 1 (ONE) tablet by mouth every evening.   fluconazole 150 MG tablet Commonly known as: DIFLUCAN Take one pill by mouth now and repeat dose in 3 days   fluticasone 220 MCG/ACT inhaler Commonly known as: Flovent HFA Inhale 2 puffs into the lungs 2 (two) times daily. 2 puffs 2 times daily during flare up.   gabapentin 300 MG capsule Commonly known as: NEURONTIN Take one capsule in the morning, one at lunch and two at bedtime   Gemtesa 75 MG Tabs Generic drug: Vibegron Take 75 mg by mouth daily.   Glyxambi 10-5 MG Tabs Generic drug: Empagliflozin-linaGLIPtin TAKE 1 TABLET BY MOUTH DAILY   hydrOXYzine 10 MG tablet Commonly known as: ATARAX Take 1 tablet (10 mg total) by mouth daily as needed for itching.   ipratropium 0.06 % nasal spray Commonly known as: ATROVENT Place 2 sprays into both nostrils 2 (two) times daily as needed for rhinitis.   irbesartan 150 MG tablet Commonly known as: AVAPRO TAKE 1 TABLET(150 MG) BY MOUTH DAILY   Magnesium 500 MG Tabs Take 500 mg by mouth 2 (two) times daily.   methimazole 5 MG tablet Commonly known as: TAPAZOLE Take 5 mg by mouth daily.   montelukast 10 MG tablet Commonly known as: SINGULAIR Take 1 tablet (10 mg total) by mouth at bedtime. For asthma control.   Nebulizer Mask Adult Misc 1 kit by Does not apply route as directed.   Nebulizer Misc 1 Device by Does not apply route as directed.   ondansetron  4 MG disintegrating tablet Commonly known as: ZOFRAN-ODT Take 1 tablet (4 mg total) by mouth every 8 (eight) hours as needed for nausea or vomiting.   OneTouch Delica Plus Lancet33G Misc USE TO CHECK BLOOD SUGAR UP TO TWICE DAILY   OneTouch Verio test strip Generic drug: glucose blood 1 strip 2 (two) times daily   OneTouch Verio w/Device Kit Use to check blood sugar up to 2 times a day   pantoprazole 40 MG tablet Commonly known as: Protonix Take 1 tablet (40 mg total) by mouth every morning.   polyethylene glycol powder 17 GM/SCOOP powder Commonly known as: GLYCOLAX/MIRALAX Take 1 Container by mouth once.   predniSONE 20 MG tablet Commonly known as: DELTASONE Take 1 tablet (20 mg total) by mouth daily with breakfast.   PRESCRIPTION MEDICATION Inhale into the lungs at bedtime. CPAP   rivaroxaban 20 MG Tabs tablet  Commonly known as: Xarelto Take 1 tablet (20 mg total) by mouth daily with supper.   SALONPAS JET SPRAY EX Apply 1 spray topically daily as needed (knee pain).   SYSTANE OP Place 1 drop into both eyes daily as needed (dry eyes).   triamcinolone 55 MCG/ACT Aero nasal inhaler Commonly known as: NASACORT Place 2 sprays into the nose daily.   Trulance 3 MG Tabs Generic drug: Plecanatide Take 3 mg by mouth daily as needed (constipation).        Past Medical History:  Diagnosis Date   Allergic rhinitis    Allergy    Arthritis    Asthma    Breast cancer (HCC)    Chronic headache    Diabetes mellitus    Ductal carcinoma in situ (DCIS) of left breast 02/23/2022   DVT (deep venous thrombosis) (HCC)    Dyspnea    Heart murmur    History of radiation therapy    Left Breast 05/02/22-05/30/22- Dr. Antony Blackbird   Hypertension    Penicillin allergy 01/19/2020   Pulmonary embolism (HCC)    Pulmonary hypertension (HCC)    Seizures (HCC)     Past Surgical History:  Procedure Laterality Date   ABDOMINAL HYSTERECTOMY     partial, has ovaries   BREAST  LUMPECTOMY WITH RADIOACTIVE SEED LOCALIZATION Left 03/29/2022   Procedure: LEFT BREAST LUMPECTOMY WITH RADIOACTIVE SEED LOCALIZATION;  Surgeon: Harriette Bouillon, MD;  Location: [facility] OR;  Service: General;  Laterality: Left;   CARDIAC CATHETERIZATION  12/28/2010   Mod. pulmonary hypertension, normal coronary arteries   CARDIOVERSION N/A 11/23/2020   Procedure: CARDIOVERSION;  Surgeon: Thurmon Fair, MD;  Location: [facility];  Service: Cardiovascular;  Laterality: N/A;   CHOLECYSTECTOMY N/A 12/20/2021   Procedure: LAPAROSCOPIC CHOLECYSTECTOMY WITH INTRAOPERATIVE CHOLANGIOGRAM;  Surgeon: Darnell Level, MD;  Location: [facility] ORS;  Service: General;  Laterality: N/A;   DOPPLER ECHOCARDIOGRAPHY  10/08/2011   EF=>55%,mild asymmetric LVH, mod. TR, mod. PH, mild to mod LA dilatation   ESOPHAGEAL MANOMETRY N/A 06/19/2023   Procedure: ESOPHAGEAL MANOMETRY (EM);  Surgeon: Kerin Salen, MD;  Location: [facility];  Service: Gastroenterology;  Laterality: N/A;   IR LUMBAR DISC ASPIRATION W/IMG GUIDE  01/12/2020   KNEE ARTHROSCOPY Left    KNEE SURGERY     Nuclear Stress Test  05/20/2006   No ischemia   PARTIAL HYSTERECTOMY     PLANTAR FASCIA SURGERY     TONSILLECTOMY      Review of systems negative except as noted in HPI / PMHx or noted below:  Review of Systems  Constitutional: Negative.   HENT: Negative.    Eyes: Negative.   Respiratory: Negative.    Cardiovascular: Negative.   Gastrointestinal: Negative.   Genitourinary: Negative.   Musculoskeletal: Negative.   Skin: Negative.   Neurological: Negative.   Endo/Heme/Allergies: Negative.   Psychiatric/Behavioral: Negative.       Objective:   Vitals:   09/24/23 1530  BP: (!) 140/76  Pulse: 75  Resp: 14  Temp: 98.7 F (37.1 C)  SpO2: 97%   Height: 5\' 3"  (160 cm)  Weight: 193 lb 4.8 oz (87.7 kg)   Physical Exam Constitutional:      Appearance: She is not diaphoretic.  HENT:     Head: Normocephalic.     Right Ear: Tympanic membrane, ear  canal and external ear normal.     Left Ear: Tympanic membrane, ear canal and external ear normal.     Nose: Nose normal. No mucosal edema or rhinorrhea.  Mouth/Throat:     Pharynx: Uvula midline. No oropharyngeal exudate.  Eyes:     Conjunctiva/sclera: Conjunctivae normal.  Neck:     Thyroid: No thyromegaly.     Trachea: Trachea normal. No tracheal tenderness or tracheal deviation.  Cardiovascular:     Rate and Rhythm: Normal rate and regular rhythm.     Heart sounds: Normal heart sounds, S1 normal and S2 normal. No murmur heard. Pulmonary:     Effort: No respiratory distress.     Breath sounds: No stridor. Wheezing (Laryngeal wheezing.  Lungs clear) present. No rales.  Lymphadenopathy:     Head:     Right side of head: No tonsillar adenopathy.     Left side of head: No tonsillar adenopathy.     Cervical: No cervical adenopathy.  Skin:    Findings: No erythema or rash.     Nails: There is no clubbing.  Neurological:     Mental Status: She is alert.     Diagnostics:    Spirometry was performed and demonstrated an FEV1 of 1.46 at 87 % of predicted.  The patient had an Asthma Control Test with the following results:  .    Assessment and Plan:   1. Asthma, severe persistent, well-controlled   2. Other allergic rhinitis   3. LPRD (laryngopharyngeal reflux disease)   4. Food allergy    1. Continue to treat and prevent airway inflammation:  A.  Advair 230 - 2 inhalations 2 times a day (empty lungs)  B.  Montelukast 10mg  - 1 tablet 1 time per day.  C.  Tezepelumab injections.    2. Continue to treat reflux / swallowing issue:  A. INCREASE Pantoprazole 40mg  - 2 times per day B. Famotidine 40 mg in PM C. Replace throat clearing with swallowing / drinking maneuver D. Evaluation of throat with ENT E. Modified barium swallow   3. If needed:   A. Nasal saline several times per day  B. Albuterol + Fluticasone 220 - 2 inhalations TOGETHER every 6 hours  C. Epi-pen     4. Return to clinic in 12 weeks or earlier if there is a problem.  5. Influenza = Tamiflu. Covid = molnupiravir   [name] appears to have a persistent problem with her throat at this point in time and we need to visualize her larynx structures and obtain a modified barium swallow as she is definitely having some issues with swallowing.  All of her wheezing appears to be coming from her throat and I do not think that her lungs are in particularly bad shape nor is her upper airway were not really going to change any of her therapy for airway inflammation.  We will increase her therapy for reflux/LPR by having her use pantoprazole twice a day while maintaining therapy with famotidine.  I will see her back in this clinic in 12 weeks.  Alicia Schimke, MD Allergy / Immunology [facility]

## 2023-09-25 ENCOUNTER — Encounter: Payer: Self-pay | Admitting: Allergy and Immunology

## 2023-09-26 ENCOUNTER — Telehealth: Payer: Self-pay | Admitting: Allergy and Immunology

## 2023-09-26 ENCOUNTER — Telehealth: Payer: Self-pay

## 2023-09-26 ENCOUNTER — Other Ambulatory Visit: Payer: Self-pay

## 2023-09-26 DIAGNOSIS — G4700 Insomnia, unspecified: Secondary | ICD-10-CM | POA: Insufficient documentation

## 2023-09-26 DIAGNOSIS — K219 Gastro-esophageal reflux disease without esophagitis: Secondary | ICD-10-CM

## 2023-09-26 DIAGNOSIS — R1310 Dysphagia, unspecified: Secondary | ICD-10-CM

## 2023-09-26 NOTE — Telephone Encounter (Signed)
 Patient states she is still wheezing a little and wants to know if Dr. Lucie Leather wants her to continue her nebulizer.

## 2023-09-26 NOTE — Progress Notes (Signed)
 Subjective:  Patient ID: [name], female    DOB: 06-25-1946,  MRN: [id number]  [name] presents to clinic today for at risk foot care with history of diabetic neuropathy and callus(es) of both feet, porokeratotic lesion(s) of both feet, and painful mycotic nails. Painful toenails interfere with ambulation. Aggravating factors include wearing enclosed shoe gear. Pain is relieved with periodic professional debridement. Painful callus(es) and porokeratotic lesion(s) are aggravated when weightbearing with and without shoegear. Pain is relieved with periodic professional debridement.   New problem(s): None.   PCP is Excell Seltzer, MD. Theron Arista 08/02/2023.  Allergies  Allergen Reactions   Dilantin [Phenytoin] Swelling    facial swelling   Latex Hives   Oxycodone Nausea And Vomiting and Nausea Only    Abdominal Pain, Vomiting   Penicillins Nausea And Vomiting and Swelling    Has patient had a PCN reaction causing immediate rash, facial/tongue/throat swelling, SOB or lightheadedness with hypotension patient had a PCN reaction causing severe rash involving mucus membranes or skin necrosis: [id number]} Has patient had a PCN reaction that required hospitalization/No Has patient had a PCN reaction occurring within the last 10 years: No If all of the above answers are "NO", then may proceed with Cephalosporin use.      Codeine Nausea Only    Tolerates tylenol with codeine   Hydrocodone Nausea And Vomiting   Nickel Hives and Rash   Pregabalin Itching and Other (See Comments)    headaches/problem w/vision    Review of Systems: Negative except as noted in the HPI.  Objective:  There were no vitals filed for this visit. [name] is a pleasant 78 y.o. female in NAD. AAO x 3.  Vascular Examination: Capillary refill time immediate b/l. Vascular status intact b/l with palpable pedal pulses. Pedal hair sparse. No edema. No pain with calf compression b/l. Skin temperature  gradient WNL b/l. No cyanosis or clubbing b/l. No ischemia or gangrene noted b/l LE.  Neurological Examination: Sensation grossly intact b/l with 10 gram monofilament. Vibratory sensation intact b/l. Pt has subjective symptoms of neuropathy.  Dermatological Examination: Pedal skin with normal turgor, texture and tone b/l.  No open wounds. No interdigital macerations.   Toenails 2-5 b/l thick, discolored, elongated with subungual debris and pain on dorsal palpation.   Anonychia noted bilateral great toes. Nailbed(s) epithelialized.    Hyperkeratotic lesion(s) distal tip of left great toe, submet head 2, 5 left foot and medial aspect right great toe.  No erythema, no edema, no drainage, no fluctuance.   Porokeratotic lesion(s) submet head 5 right foot and sub 5th met base right lower extremity. No erythema, no edema, no drainage, no fluctuance.  Musculoskeletal Examination: Muscle strength 5/5 to all lower extremity muscle groups bilaterally. Plantarflexed metatarsal(s) 5th metatarsal head b/l lower extremities.  Radiographs: None  Assessment/Plan: 1. Pain due to onychomycosis of toenail   2. Porokeratosis   3. Callus   4. Diabetic polyneuropathy associated with type 2 diabetes mellitus (HCC)    -Patient was evaluated today. All questions/concerns addressed on today's visit. -Patient to continue soft, supportive shoe gear daily. -Mycotic toenails bilateral great toes, submet head 2 left foot, and submet head 5 left foot were debrided in length and girth with sterile nail nippers and dremel without iatrogenic bleeding. -Callus(es) bilateral great toes, submet head 2 left foot, and submet head 5 left foot pared utilizing sterile scalpel blade without complication or incident. Total number debrided =4. -Porokeratotic lesion(s) submet head 5 right foot and  sub 5th met base right foot pared and enucleated with sterile currette without incident. Total number of lesions  debrided=2. -Patient/POA to call should there be question/concern in the interim.   Return in about 3 months (around 12/24/2023).  Freddie Breech, DPM       LOCATION: [address] [place] [address]                   Office [phone]   [facility] LOCATION: [address] [place], [place] [address] Office [phone]

## 2023-09-26 NOTE — Telephone Encounter (Signed)
 OP MBS Modified Barium Swallow Test - [id number] and DG Esophagus with single camera test have been ordered and signed for patient.  Called Outpatient Rehab - spoke to [facility] - advised orders have been place.  Amber confirmed she received order - patient has been contacted - Tests have been scheduled:  Thursday, 10/03/23 @ 9 am. - Modified Barium Swallow test will be done first then the esophagus test will piggyback.  Forwarding updated message to provider.

## 2023-09-26 NOTE — Assessment & Plan Note (Signed)
 Chronic, stable control on Advair.  No current asthma exacerbation.

## 2023-09-26 NOTE — Assessment & Plan Note (Signed)
 Acute, intermittent not persistent She will try melatonin as well as increase daytime exercise to improve sleep.  melatonin 3 mg at bedtime.  Return  to Yoga.

## 2023-09-26 NOTE — Telephone Encounter (Signed)
Informed patient of Dr. [name] message.

## 2023-09-26 NOTE — Assessment & Plan Note (Signed)
 Chronic with acute worsening in the last 2 weeks.  Likely secondary to allergies.  She is currently taking montelukast 10 mg 1 tablet daily. She is requesting a medication to help with production of mucus.  We will treat with benzonatate 200 mg twice daily as needed. No red flags.  Return and ER precautions provided.

## 2023-09-27 DIAGNOSIS — R194 Change in bowel habit: Secondary | ICD-10-CM | POA: Diagnosis not present

## 2023-09-27 DIAGNOSIS — K219 Gastro-esophageal reflux disease without esophagitis: Secondary | ICD-10-CM | POA: Diagnosis not present

## 2023-09-27 DIAGNOSIS — R1310 Dysphagia, unspecified: Secondary | ICD-10-CM | POA: Diagnosis not present

## 2023-10-01 ENCOUNTER — Ambulatory Visit: Admitting: Podiatry

## 2023-10-02 ENCOUNTER — Telehealth: Payer: Self-pay | Admitting: *Deleted

## 2023-10-02 NOTE — Telephone Encounter (Signed)
 Received call from pt with complaint of left breast soreness and itching x 3 days.  Pt states she used a new body scrub 1 week ago and is requesting advice from MD if symptoms could be related to body scrub.  Per MD pt to apply OTC cortisone cream and lotion with no scent to the breast and to contact our office in 1 week for [facility] visit if symptoms do not improve.  Pt verbalized understanding.

## 2023-10-03 ENCOUNTER — Ambulatory Visit (HOSPITAL_COMMUNITY)
Admission: RE | Admit: 2023-10-03 | Discharge: 2023-10-03 | Disposition: A | Discharge status: Home / Self Care | Source: Ambulatory Visit | Attending: *Deleted | Admitting: *Deleted

## 2023-10-03 ENCOUNTER — Ambulatory Visit (HOSPITAL_COMMUNITY)
Admission: RE | Admit: 2023-10-03 | Discharge: 2023-10-03 | Disposition: A | Discharge status: Home / Self Care | Source: Ambulatory Visit | Attending: Allergy and Immunology | Admitting: Allergy and Immunology

## 2023-10-03 DIAGNOSIS — K219 Gastro-esophageal reflux disease without esophagitis: Secondary | ICD-10-CM | POA: Diagnosis not present

## 2023-10-03 DIAGNOSIS — I272 Pulmonary hypertension, unspecified: Secondary | ICD-10-CM | POA: Insufficient documentation

## 2023-10-03 DIAGNOSIS — R059 Cough, unspecified: Secondary | ICD-10-CM | POA: Diagnosis not present

## 2023-10-03 DIAGNOSIS — R1313 Dysphagia, pharyngeal phase: Secondary | ICD-10-CM | POA: Diagnosis not present

## 2023-10-03 DIAGNOSIS — K224 Dyskinesia of esophagus: Secondary | ICD-10-CM | POA: Diagnosis not present

## 2023-10-03 DIAGNOSIS — R1310 Dysphagia, unspecified: Secondary | ICD-10-CM | POA: Diagnosis not present

## 2023-10-03 DIAGNOSIS — Z853 Personal history of malignant neoplasm of breast: Secondary | ICD-10-CM | POA: Insufficient documentation

## 2023-10-03 DIAGNOSIS — R011 Cardiac murmur, unspecified: Secondary | ICD-10-CM | POA: Diagnosis not present

## 2023-10-03 DIAGNOSIS — Z86718 Personal history of other venous thrombosis and embolism: Secondary | ICD-10-CM | POA: Diagnosis not present

## 2023-10-03 DIAGNOSIS — R058 Other specified cough: Secondary | ICD-10-CM | POA: Diagnosis present

## 2023-10-03 DIAGNOSIS — Z923 Personal history of irradiation: Secondary | ICD-10-CM | POA: Insufficient documentation

## 2023-10-14 DIAGNOSIS — R1310 Dysphagia, unspecified: Secondary | ICD-10-CM

## 2023-10-15 ENCOUNTER — Ambulatory Visit: Payer: Federal, State, Local not specified - PPO | Admitting: Allergy and Immunology

## 2023-10-16 ENCOUNTER — Encounter: Payer: Self-pay | Admitting: Family Medicine

## 2023-10-18 ENCOUNTER — Encounter (INDEPENDENT_AMBULATORY_CARE_PROVIDER_SITE_OTHER): Payer: Self-pay | Admitting: Otolaryngology

## 2023-10-21 ENCOUNTER — Other Ambulatory Visit: Payer: Self-pay | Admitting: Family Medicine

## 2023-10-21 ENCOUNTER — Other Ambulatory Visit: Payer: Self-pay

## 2023-10-21 DIAGNOSIS — D0512 Intraductal carcinoma in situ of left breast: Secondary | ICD-10-CM

## 2023-10-21 DIAGNOSIS — E0500 Thyrotoxicosis with diffuse goiter without thyrotoxic crisis or storm: Secondary | ICD-10-CM

## 2023-10-22 ENCOUNTER — Ambulatory Visit: Payer: Medicare Other | Admitting: Allergy and Immunology

## 2023-10-22 ENCOUNTER — Ambulatory Visit

## 2023-10-22 DIAGNOSIS — J4550 Severe persistent asthma, uncomplicated: Secondary | ICD-10-CM | POA: Diagnosis not present

## 2023-10-23 ENCOUNTER — Ambulatory Visit: Attending: Allergy and Immunology

## 2023-10-23 DIAGNOSIS — R1310 Dysphagia, unspecified: Secondary | ICD-10-CM | POA: Insufficient documentation

## 2023-10-23 DIAGNOSIS — R1313 Dysphagia, pharyngeal phase: Secondary | ICD-10-CM | POA: Insufficient documentation

## 2023-10-23 DIAGNOSIS — R498 Other voice and resonance disorders: Secondary | ICD-10-CM | POA: Diagnosis not present

## 2023-10-23 NOTE — Therapy (Signed)
 OUTPATIENT SPEECH LANGUAGE PATHOLOGY SWALLOW EVALUATION   Patient Name: [name] MRN: [id number] DOB:Jan 21, 1946, 78 y.o., female Today's Date: 10/23/2023  PCP: Excell Seltzer MD REFERRING PROVIDER: Jessica Priest, MD  END OF SESSION:  End of Session - 10/23/23 1258     Visit Number 1    Number of Visits 17    Date for SLP Re-Evaluation 12/18/23    SLP Start Time 1016    SLP Stop Time  1100    SLP Time Calculation (min) 44 min    Activity Tolerance Patient tolerated treatment well             Evaluation and documentation in part completed by supervised graduate clinician, Roylene Reason.    Past Medical History:  Diagnosis Date   Allergic rhinitis    Allergy    Arthritis    Asthma    Breast cancer (HCC)    Chronic headache    Diabetes mellitus    Ductal carcinoma in situ (DCIS) of left breast 02/23/2022   DVT (deep venous thrombosis) (HCC)    Dyspnea    Heart murmur    History of radiation therapy    Left Breast 05/02/22-05/30/22- Dr. Antony Blackbird   Hypertension    Penicillin allergy 01/19/2020   Pulmonary embolism (HCC)    Pulmonary hypertension (HCC)    Seizures (HCC)    Past Surgical History:  Procedure Laterality Date   ABDOMINAL HYSTERECTOMY     partial, has ovaries   BREAST LUMPECTOMY WITH RADIOACTIVE SEED LOCALIZATION Left 03/29/2022   Procedure: LEFT BREAST LUMPECTOMY WITH RADIOACTIVE SEED LOCALIZATION;  Surgeon: Harriette Bouillon, MD;  Location: [facility] OR;  Service: General;  Laterality: Left;   CARDIAC CATHETERIZATION  12/28/2010   Mod. pulmonary hypertension, normal coronary arteries   CARDIOVERSION N/A 11/23/2020   Procedure: CARDIOVERSION;  Surgeon: Thurmon Fair, MD;  Location: [facility];  Service: Cardiovascular;  Laterality: N/A;   CHOLECYSTECTOMY N/A 12/20/2021   Procedure: LAPAROSCOPIC CHOLECYSTECTOMY WITH INTRAOPERATIVE CHOLANGIOGRAM;  Surgeon: Darnell Level, MD;  Location: [facility];  Service: General;  Laterality: N/A;   DOPPLER  ECHOCARDIOGRAPHY  10/08/2011   EF=>55%,mild asymmetric LVH, mod. TR, mod. PH, mild to mod LA dilatation   ESOPHAGEAL MANOMETRY N/A 06/19/2023   Procedure: ESOPHAGEAL MANOMETRY (EM);  Surgeon: Kerin Salen, MD;  Location: [facility];  Service: Gastroenterology;  Laterality: N/A;   IR LUMBAR DISC ASPIRATION W/IMG GUIDE  01/12/2020   KNEE ARTHROSCOPY Left    KNEE SURGERY     Nuclear Stress Test  05/20/2006   No ischemia   PARTIAL HYSTERECTOMY     PLANTAR FASCIA SURGERY     TONSILLECTOMY     Patient Active Problem List   Diagnosis Date Noted   Acute insomnia 09/26/2023   Vaginal discomfort 06/12/2023   Traumatic hematoma of right knee 05/03/2023   Urinary frequency 05/03/2023   Pruritus 09/04/2022   SSS (sick sinus syndrome) (HCC) 04/26/2022   Family history of breast cancer 02/28/2022   Family history of colon cancer in mother 02/28/2022   Ductal carcinoma in situ (DCIS) of left breast 02/23/2022   Family history of malignant neoplasm of digestive organs 02/02/2022   Anemia 12/29/2021   Chronic constipation 12/29/2021   Chronic anticoagulation - Xerelto 12/18/2021   Hyperthyroidism 12/18/2021   Status post total left knee replacement 12/09/2021   Presence of right artificial knee joint 10/30/2021   Constipation by delayed colonic transit 09/25/2021   Atypical atrial flutter (HCC) 06/24/2021   Chronic diastolic heart failure (  HCC) 06/24/2021   Stenosing tenosynovitis of finger of right hand 09/06/2020   Type 2 diabetes mellitus with diabetic neuropathy, unspecified (HCC) 06/20/2020   Acute pain of left wrist 06/03/2020   Dysphonia 04/08/2020   Vaginal irritation 02/16/2020   Discitis of lumbosacral region 01/04/2020   Hyperlipidemia associated with type 2 diabetes mellitus (HCC) 05/07/2019   Obstructive sleep apnea treated with continuous positive airway pressure (CPAP) 06/03/2018   Left thyroid nodule 02/06/2018   Insomnia secondary to chronic pain 01/26/2018   Class 3  drug-induced obesity with serious comorbidity and body mass index (BMI) of 40.0 to 44.9 in adult (HCC) 12/03/2017   Graves disease 10/15/2017   Acute diarrhea 01/04/2017   Dry mouth 11/01/2016   Bilateral high frequency sensorineural hearing loss 10/24/2016   Subjective tinnitus, bilateral 10/24/2016   Other fatigue 12/09/2015   Moderate persistent asthma 07/12/2015   Paroxysmal atrial fibrillation (HCC) 06/03/2015   Neuropathy due to secondary diabetes (HCC) 01/11/2015   Migraine headache without aura 08/24/2014   Vaginal candida 06/07/2014   Allergic sinusitis 03/25/2014   DVT (deep venous thrombosis) hx of  03/25/2014   Osteoarthritis of right knee 03/25/2014    ? of Seizure disorder 03/25/2014   Depression, major, in remission (HCC) 03/25/2014   Hypertension associated with diabetes (HCC) 03/25/2014   GERD (gastroesophageal reflux disease) 03/25/2014   History of DVT (deep vein thrombosis) 03/25/2014   History of pulmonary embolism 10/09/2013   Asthma, moderate persistent 01/24/2011   Pulmonary hypertension (HCC) 01/11/2011    ONSET DATE: 10/14/2023 (referral date)   REFERRING DIAG: R13.10 (ICD-10-CM) - Swallowing dysfunction  THERAPY DIAG: Dysphagia, pharyngeal phase  Other voice and resonance disorders  Rationale for Evaluation and Treatment: Rehabilitation  SUBJECTIVE:   SUBJECTIVE STATEMENT: Pt reports limited understanding of MBSS results, shares she was unsure of why ST was recommended. Pt accompanied by: self  PERTINENT HISTORY: "Pt is a 78 yo female referred for MBS and esophagram by Dr Lucie Leather. Pt with PMH + for GERD, breast cancer, DVT, seizures, pulmonary HTN, allergic rhinitis, dyspnea, heart mumur, radiation therapy 05/02/2022-05/30/2022 left breast. Pt describes dysphagia to MD as mucus in the throat with frequent throat clearing. ? h/o facial droop and dysarthria on 07/04/23. Has xerostomia, S/P manometry showing nonspecific esoph dysmotiltiy, imcomplete  clearance of allow swallows, normal resting LES, slight increased pressure for relaxation of LES, intact peristalsis 06/19/2023. Esophagram completed in 2024 showed penetration of liquids but no aspiration. Patient does endorse some coughing with liquid intake. Patient reports significant issues with coughing and states her allergist informed her to swallow when this occurs but she states this is difficult due to her dry mouth. And if she swallows water it may cause her to cough. SLP advised to consider using sugar-free lemon drops if Dr. Lucie Leather is agreeable. She does endorse some voice changes at times. Reports she takes her PPI twice a day and her famotidine at night. SLP asked if she ever drools to which patient stated on occasion when chewing gum noting this occurs more on the left side. Does appear with slight facial asymmetry on the left with decreased labial closure when taxed with resistance. Denies having any pneumonia stating she got the pneumonia shot."  PAIN: Are you having pain? No  FALLS: Has patient fallen in last 6 months?  Yes once in October   LIVING ENVIRONMENT: Lives with: lives with their family Lives in: House/apartment  PLOF:  Level of assistance: Independent with ADLs Employment: Retired, previously working at post  office  PATIENT GOAL's: Pt shared hopes to improve her swallow and reduce habitual throat clearing  OBJECTIVE:  Note: Objective measures were completed at Evaluation unless otherwise noted. OBJECTIVE:   INSTRUMENTAL SWALLOW STUDY FINDINGS (MBSS 10/03/23) Clinical Impression: Patient presents with mild pharyngeal dysphagia mostly characterized by inadequate laryngeal elevation and closure resulting in minimal trace aspiration of thin liquids on an inconsistent basis.  Chin tuck posture not helpful as it allowed barium to spill into open larynx during the swallow.   Using thin liquid to wash down food worsened penetration and aspiration.  Though patient does not  sense trace aspiration cued cough is effective to help her to clear.  Pharyngeal swallow is strong fortunately.  Trace retention with liquids at vallecula and piriform sinus likely due to pressure issue from known esophageal deficits.     Patient denies any issues with pulmonary infections.  No aspiration or deep penetration of any other consistencies.  Patient was tested both with thin and ultrathin (thin barium halfed with water) -and no significant difference with airway protection noted.     Advised patient if consumption of coffee with large amounts of cream or slightly thicker liquids such as orange juice or Ensure, Glucerna are more comfortable for her to swallow -she may wish to drink these during her meals.  Encouraged her to drink water to help with hydration and advised that water is pH neutral thus safest liquid to aspirate.  She endorses taking her medications with applesauce for ease of swallow.  Recommended that if she does that to start intake with liquid and drink liquids after swallowing medications with applesauce.  Thank you so much for this consult.    ESOPHAGRAM IMPRESSION (10/03/23): Mild-to-moderate esophageal dysmotility.  COGNITION: Overall cognitive status: Within functional limits for tasks assessed  SUBJECTIVE DYSPHAGIA REPORTS:  Date of onset: within last year Reported symptoms: globus sensation and hoarseness  Current diet: regular and thin liquids  Co-morbid voice changes: none reported   FACTORS WHICH MAY INCREASE RISK OF ADVERSE EVENT IN PRESENCE OF ASPIRATION:  General health: well appearing  Risk factors: GERD or other GI disease    ORAL MOTOR EXAMINATION: Lingual: Impaired Coordination, and Strength  Comments: Pt displayed reduced coordination for lingual lateralization and potentially reduced lingual strength  CLINICAL SWALLOW ASSESSMENT:   Dentition: missing dentition  Vocal quality at baseline: hoarse and rough Patient directly observed with POs:  No Feeding: able to feed self Pharyngeal phase signs and symptoms: Pt reports complaints of globus with solids  PATIENT REPORTED OUTCOME MEASURES (PROM): EAT-10: 8                                                                                                  TREATMENT DATE:  10-23-23: Eval and POC completed. Initiated education of MBSS results and negative impact of habitual throat clearing. Planning to address throat clear alternatives next session. Recommended pt visit ENT, specifically laryngologist, to further investigate concerns with vocal quality and reported excess mucus.  PATIENT EDUCATION: Education details: see above Person educated: Patient Education method: Explanation Education comprehension: verbalized understanding and needs further  education  ASSESSMENT:  CLINICAL IMPRESSION: Patient is a 78 y.o. M who was seen today for swallow evaluation. Recent MBSS indicated mild pharyngeal dysphagia d/t inadequate laryngeal elevation resulting in trace amounts of aspiration with thin liquids on an inconsistent basis. Cued coughs were beneficial as pt not sensate to aspiration. Would benefit from education and instruction of swallow exercises and precautions given decreased airway protection and increased aspiration risk. Pt presents with mild hoarseness and usual throat clearing throughout today's session. C/o frequent globus sensation and difficulty managing mucus/pharyngeal secretions. Recommended ENT referral with laryngologist. Due to aforementioned deficits, pt would benefit from skilled ST to address dysphagia and potentially voice to optimize safety during swallowing and vocal hygiene.  OBJECTIVE IMPAIRMENTS: include voice disorder and dysphagia. These impairments are limiting patient from effectively communicating at home and in community and safety when swallowing. Factors affecting potential to achieve goals and functional outcome are ability to learn/carryover information and  cooperation/participation level. Patient will benefit from skilled SLP services to address above impairments and improve overall function.  REHAB POTENTIAL: Good   GOALS: Goals reviewed with patient? Yes  SHORT TERM GOALS: Target date: 11/20/2023    Pt will accurately demonstrate swallowing exercises with min-A across 2/2 sessions Baseline: Goal status: INITIAL  2.  Pt will report daily carryover of HEP across 2/2 sessions Baseline:  Goal status: INITIAL  3.  Pt will successfully suppress throat clearing across 2/2 sessions with min-A Baseline:  Goal status: INITIAL  4.  Pt will demonstrate understanding of swallow compensations or strategies via teach back with mod-I. Baseline:  Goal status: INITIAL  5.    Pt will verbalize 3 reflux precautions with mod I Baseline:  Goal status: INITIAL   LONG TERM GOALS: Target date: 12/18/2023  Pt will report reduction in frequency of throat clearing > 1 week with use of trained techniques  Baseline:  Goal status: INITIAL  2.  Pt will report improved score of PROM by at least 2 points by LTG date. Baseline: EAT-10=8 Goal status: INITIAL  3.  Pt will demonstrate swallowing exercises with mod-I across 2/2 sessions. Baseline:  Goal status: INITIAL  4.    Pt will be independent with HEP Baseline:  Goal status: INITIAL  PLAN:  SLP FREQUENCY: 2x/week  SLP DURATION: 8 weeks  PLANNED INTERVENTIONS: Aspiration precaution training, Pharyngeal strengthening exercises, Environmental controls, Cueing hierachy, SLP instruction and feedback, Compensatory strategies, Patient/family education, and [address] Treatment of speech (30 or 45 min)     Gracy Racer, CCC-SLP 10/23/2023, 1:24 PM

## 2023-10-28 ENCOUNTER — Ambulatory Visit: Admitting: Speech Pathology

## 2023-10-28 ENCOUNTER — Encounter: Payer: Self-pay | Admitting: Speech Pathology

## 2023-10-28 DIAGNOSIS — R498 Other voice and resonance disorders: Secondary | ICD-10-CM | POA: Diagnosis not present

## 2023-10-28 DIAGNOSIS — R1313 Dysphagia, pharyngeal phase: Secondary | ICD-10-CM

## 2023-10-28 DIAGNOSIS — R1310 Dysphagia, unspecified: Secondary | ICD-10-CM | POA: Diagnosis not present

## 2023-10-28 NOTE — Patient Instructions (Signed)
   Do try to eliminate throat clearing behavior - this is irritating to your vocal folds and results in your body sending phlegm to soothe the irritation, so you want to clear your throat which is irritating and then you get more phlegm - you can break this cycle  Instead of clearing your throat, try a hard swallow, some sips, then sniff sniff, swallow or sip  Reflux gourmet is an all natural supplement that blocks reflux - it is made from algae and called an alginate  -the throat doctor recommends this after each meal and before bed  The doctor in town who specializes in the throat and swallowing is Dr. Artice Last [phone] - if you can, get a referral to this ENT  When you swallow, do so with intent - be mindful of swallowing with effort during meals- avoid distractions when eating. If you are in a restaurant, be mindful of not talking when eating or swallowing.Take turns with chewing and swallowing, then talking  SWALLOWING EXERCISES Effortful Swallows - Squeeze hard with the muscles in your neck while you swallow your  saliva or a sip of water - Repeat 20 times, 2-3 times a day, and use whenever you eat or drink  [place] Swallow - swallow with your tongue sticking out - Stick tongue out and gently bite tongue with your teeth - Swallow, while holding your tongue with your teeth - Repeat 20 times, 2-3 times a day   Shaker Exercise - head lift - Lie flat on your back in your bed or on a couch without pillows - Raise your head and look at your feet  - KEEP YOUR SHOULDERS DOWN - HOLD FOR 45 to 60 SECONDS, then lower your head back down - Repeat 3 times, 2-3 times a day   [employer] -  swallow as tight as you  for 5 seconds - Start to swallow, and keep your Adam's apple up by squeezing tight with the muscles of the throat - Hold the squeeze for 5-7 seconds and then relax - Repeat 20 times, 2-3 times a day   Tongue Press - Press your entire tongue as hard as you  can against the roof of your mouth for 3-5 seconds - Repeat 20 times, 2-3 times a day        6. CTAR - Chin Tuck Against Resistance              - Place towel, ball or pool noodle under your chin             - Hold for 60 seconds 2-3x  a day             - Pulse up and down 20x 2-3x a day       7. Hold your breath, swallow and cough - do not breathe in before you cough              - 20x twice a day

## 2023-10-28 NOTE — Therapy (Signed)
 OUTPATIENT SPEECH LANGUAGE PATHOLOGY SWALLOW TREATMENT   Patient Name: [name] MRN: [id number] DOB:10/03/1945, 78 y.o., female Today's Date: 10/28/2023  PCP: Excell Seltzer MD REFERRING PROVIDER: Jessica Priest, MD  END OF SESSION:  End of Session - 10/28/23 1014     Visit Number 2    Number of Visits 17    Date for SLP Re-Evaluation 12/18/23    SLP Start Time 1015    SLP Stop Time  1100    SLP Time Calculation (min) 45 min    Activity Tolerance Patient tolerated treatment well             Evaluation and documentation in part completed by supervised graduate clinician, Roylene Reason.    Past Medical History:  Diagnosis Date   Allergic rhinitis    Allergy    Arthritis    Asthma    Breast cancer (HCC)    Chronic headache    Diabetes mellitus    Ductal carcinoma in situ (DCIS) of left breast 02/23/2022   DVT (deep venous thrombosis) (HCC)    Dyspnea    Heart murmur    History of radiation therapy    Left Breast 05/02/22-05/30/22- Dr. Antony Blackbird   Hypertension    Penicillin allergy 01/19/2020   Pulmonary embolism (HCC)    Pulmonary hypertension (HCC)    Seizures (HCC)    Past Surgical History:  Procedure Laterality Date   ABDOMINAL HYSTERECTOMY     partial, has ovaries   BREAST LUMPECTOMY WITH RADIOACTIVE SEED LOCALIZATION Left 03/29/2022   Procedure: LEFT BREAST LUMPECTOMY WITH RADIOACTIVE SEED LOCALIZATION;  Surgeon: Harriette Bouillon, MD;  Location: [facility] OR;  Service: General;  Laterality: Left;   CARDIAC CATHETERIZATION  12/28/2010   Mod. pulmonary hypertension, normal coronary arteries   CARDIOVERSION N/A 11/23/2020   Procedure: CARDIOVERSION;  Surgeon: Thurmon Fair, MD;  Location: [facility];  Service: Cardiovascular;  Laterality: N/A;   CHOLECYSTECTOMY N/A 12/20/2021   Procedure: LAPAROSCOPIC CHOLECYSTECTOMY WITH INTRAOPERATIVE CHOLANGIOGRAM;  Surgeon: Darnell Level, MD;  Location: [facility];  Service: General;  Laterality: N/A;   DOPPLER  ECHOCARDIOGRAPHY  10/08/2011   EF=>55%,mild asymmetric LVH, mod. TR, mod. PH, mild to mod LA dilatation   ESOPHAGEAL MANOMETRY N/A 06/19/2023   Procedure: ESOPHAGEAL MANOMETRY (EM);  Surgeon: Kerin Salen, MD;  Location: [facility];  Service: Gastroenterology;  Laterality: N/A;   IR LUMBAR DISC ASPIRATION W/IMG GUIDE  01/12/2020   KNEE ARTHROSCOPY Left    KNEE SURGERY     Nuclear Stress Test  05/20/2006   No ischemia   PARTIAL HYSTERECTOMY     PLANTAR FASCIA SURGERY     TONSILLECTOMY     Patient Active Problem List   Diagnosis Date Noted   Acute insomnia 09/26/2023   Vaginal discomfort 06/12/2023   Traumatic hematoma of right knee 05/03/2023   Urinary frequency 05/03/2023   Pruritus 09/04/2022   SSS (sick sinus syndrome) (HCC) 04/26/2022   Family history of breast cancer 02/28/2022   Family history of colon cancer in mother 02/28/2022   Ductal carcinoma in situ (DCIS) of left breast 02/23/2022   Family history of malignant neoplasm of digestive organs 02/02/2022   Anemia 12/29/2021   Chronic constipation 12/29/2021   Chronic anticoagulation - Xerelto 12/18/2021   Hyperthyroidism 12/18/2021   Status post total left knee replacement 12/09/2021   Presence of right artificial knee joint 10/30/2021   Constipation by delayed colonic transit 09/25/2021   Atypical atrial flutter (HCC) 06/24/2021   Chronic diastolic heart failure (  HCC) 06/24/2021   Stenosing tenosynovitis of finger of right hand 09/06/2020   Type 2 diabetes mellitus with diabetic neuropathy, unspecified (HCC) 06/20/2020   Acute pain of left wrist 06/03/2020   Dysphonia 04/08/2020   Vaginal irritation 02/16/2020   Discitis of lumbosacral region 01/04/2020   Hyperlipidemia associated with type 2 diabetes mellitus (HCC) 05/07/2019   Obstructive sleep apnea treated with continuous positive airway pressure (CPAP) 06/03/2018   Left thyroid nodule 02/06/2018   Insomnia secondary to chronic pain 01/26/2018   Class 3  drug-induced obesity with serious comorbidity and body mass index (BMI) of 40.0 to 44.9 in adult (HCC) 12/03/2017   Graves disease 10/15/2017   Acute diarrhea 01/04/2017   Dry mouth 11/01/2016   Bilateral high frequency sensorineural hearing loss 10/24/2016   Subjective tinnitus, bilateral 10/24/2016   Other fatigue 12/09/2015   Moderate persistent asthma 07/12/2015   Paroxysmal atrial fibrillation (HCC) 06/03/2015   Neuropathy due to secondary diabetes (HCC) 01/11/2015   Migraine headache without aura 08/24/2014   Vaginal candida 06/07/2014   Allergic sinusitis 03/25/2014   DVT (deep venous thrombosis) hx of  03/25/2014   Osteoarthritis of right knee 03/25/2014    ? of Seizure disorder 03/25/2014   Depression, major, in remission (HCC) 03/25/2014   Hypertension associated with diabetes (HCC) 03/25/2014   GERD (gastroesophageal reflux disease) 03/25/2014   History of DVT (deep vein thrombosis) 03/25/2014   History of pulmonary embolism 10/09/2013   Asthma, moderate persistent 01/24/2011   Pulmonary hypertension (HCC) 01/11/2011    ONSET DATE: 10/14/2023 (referral date)   REFERRING DIAG: R13.10 (ICD-10-CM) - Swallowing dysfunction  THERAPY DIAG: Dysphagia, pharyngeal phase  Other voice and resonance disorders  Rationale for Evaluation and Treatment: Rehabilitation  SUBJECTIVE:   SUBJECTIVE STATEMENT: Pt reports limited understanding of MBSS results, shares she was unsure of why ST was recommended. Pt accompanied by: self  PERTINENT HISTORY: "Pt is a 78 yo female referred for MBS and esophagram by Dr Lucie Leather. Pt with PMH + for GERD, breast cancer, DVT, seizures, pulmonary HTN, allergic rhinitis, dyspnea, heart mumur, radiation therapy 05/02/2022-05/30/2022 left breast. Pt describes dysphagia to MD as mucus in the throat with frequent throat clearing. ? h/o facial droop and dysarthria on 07/04/23. Has xerostomia, S/P manometry showing nonspecific esoph dysmotiltiy, imcomplete  clearance of allow swallows, normal resting LES, slight increased pressure for relaxation of LES, intact peristalsis 06/19/2023. Esophagram completed in 2024 showed penetration of liquids but no aspiration. Patient does endorse some coughing with liquid intake. Patient reports significant issues with coughing and states her allergist informed her to swallow when this occurs but she states this is difficult due to her dry mouth. And if she swallows water it may cause her to cough. SLP advised to consider using sugar-free lemon drops if Dr. Lucie Leather is agreeable. She does endorse some voice changes at times. Reports she takes her PPI twice a day and her famotidine at night. SLP asked if she ever drools to which patient stated on occasion when chewing gum noting this occurs more on the left side. Does appear with slight facial asymmetry on the left with decreased labial closure when taxed with resistance. Denies having any pneumonia stating she got the pneumonia shot."  PAIN: Are you having pain? No   PATIENT GOAL's: Pt shared hopes to improve her swallow and reduce habitual throat clearing  OBJECTIVE:  Note: Objective measures were completed at Evaluation unless otherwise noted. OBJECTIVE:   INSTRUMENTAL SWALLOW STUDY FINDINGS (MBSS 10/03/23) Clinical Impression: Patient presents  with mild pharyngeal dysphagia mostly characterized by inadequate laryngeal elevation and closure resulting in minimal trace aspiration of thin liquids on an inconsistent basis.  Chin tuck posture not helpful as it allowed barium to spill into open larynx during the swallow.   Using thin liquid to wash down food worsened penetration and aspiration.  Though patient does not sense trace aspiration cued cough is effective to help her to clear.  Pharyngeal swallow is strong fortunately.  Trace retention with liquids at vallecula and piriform sinus likely due to pressure issue from known esophageal deficits.     Patient denies any issues  with pulmonary infections.  No aspiration or deep penetration of any other consistencies.  Patient was tested both with thin and ultrathin (thin barium halfed with water) -and no significant difference with airway protection noted.     Advised patient if consumption of coffee with large amounts of cream or slightly thicker liquids such as orange juice or Ensure, Glucerna are more comfortable for her to swallow -she may wish to drink these during her meals.  Encouraged her to drink water to help with hydration and advised that water is pH neutral thus safest liquid to aspirate.  She endorses taking her medications with applesauce for ease of swallow.  Recommended that if she does that to start intake with liquid and drink liquids after swallowing medications with applesauce.  Thank you so much for this consult.    ESOPHAGRAM IMPRESSION (10/03/23): Mild-to-moderate esophageal dysmotility.                                                                                           TREATMENT DATE:   10-28-23: Initiated training in throat clear alternatives including hard swallow, sips, sniff-sniff-swallow - and that she may have to do this several times until the sensation passes. We discussed that reflux medications stop acid but they don't eliminate the reflux - information provided on LPR and chronic throat clearing. Initiate HEP for dysphagia - Ashunti demonstrated effortful swallow, Masko, Mendelson and tongue press with rare min A after initial instruction and modeling. Reviewed swallow precautions - Kenni is taking meds with puree - instructed her to swallow with intent, be mindful when eating with no distractions and to use effortful swallow with meals. Instructed her to be mindful to chew and swallow, then talk especially when eating out as there are more distractions.   10-23-23: Eval and POC completed. Initiated education of MBSS results and negative impact of habitual throat clearing. Planning to  address throat clear alternatives next session. Recommended pt visit ENT, specifically laryngologist, to further investigate concerns with vocal quality and reported excess mucus.  PATIENT EDUCATION: Education details: see above Person educated: Patient Education method: Explanation Education comprehension: verbalized understanding and needs further education  ASSESSMENT:  CLINICAL IMPRESSION: Patient is a 78 y.o. M who was seen today for swallow evaluation. Recent MBSS indicated mild pharyngeal dysphagia d/t inadequate laryngeal elevation resulting in trace amounts of aspiration with thin liquids on an inconsistent basis. Cued coughs were beneficial as pt not sensate to aspiration. Would benefit from education and instruction of swallow exercises and precautions given  decreased airway protection and increased aspiration risk. Pt presents with mild hoarseness and usual throat clearing throughout today's session. C/o frequent globus sensation and difficulty managing mucus/pharyngeal secretions. Recommended ENT referral with laryngologist. Due to aforementioned deficits, pt would benefit from skilled ST to address dysphagia and potentially voice to optimize safety during swallowing and vocal hygiene.  OBJECTIVE IMPAIRMENTS: include voice disorder and dysphagia. These impairments are limiting patient from effectively communicating at home and in community and safety when swallowing. Factors affecting potential to achieve goals and functional outcome are ability to learn/carryover information and cooperation/participation level. Patient will benefit from skilled SLP services to address above impairments and improve overall function.  REHAB POTENTIAL: Good   GOALS: Goals reviewed with patient? Yes  SHORT TERM GOALS: Target date: 11/20/2023    Pt will accurately demonstrate swallowing exercises with min-A across 2/2 sessions Baseline: Goal status: ONGOING  2.  Pt will report daily carryover of HEP  across 2/2 sessions Baseline:  Goal status: ONGOING  3.  Pt will successfully suppress throat clearing across 2/2 sessions with min-A Baseline:  Goal status: ONGOING  4.  Pt will demonstrate understanding of swallow compensations or strategies via teach back with mod-I. Baseline:  Goal status: ONGOING  5.    Pt will verbalize 3 reflux precautions with mod I Baseline:  Goal status: ONGOING   LONG TERM GOALS: Target date: 12/18/2023  Pt will report reduction in frequency of throat clearing > 1 week with use of trained techniques  Baseline:  Goal status: INITIAL  2.  Pt will report improved score of PROM by at least 2 points by LTG date. Baseline: EAT-10=8 Goal status: INITIAL  3.  Pt will demonstrate swallowing exercises with mod-I across 2/2 sessions. Baseline:  Goal status: INITIAL  4.    Pt will be independent with HEP Baseline:  Goal status: INITIAL  PLAN:  SLP FREQUENCY: 2x/week  SLP DURATION: 8 weeks  PLANNED INTERVENTIONS: Aspiration precaution training, Pharyngeal strengthening exercises, Environmental controls, Cueing hierachy, SLP instruction and feedback, Compensatory strategies, Patient/family education, and [id number] Treatment of speech (30 or 45 min)     Quintana Canelo, Dareen Ebbing, CCC-SLP 10/28/2023, 11:05 AM

## 2023-10-29 NOTE — Telephone Encounter (Signed)
 Gerlean has been scheduled with Dr. Soldatova for 5/29 at 3:45 pm.

## 2023-11-04 ENCOUNTER — Other Ambulatory Visit: Payer: Medicare Other

## 2023-11-04 ENCOUNTER — Ambulatory Visit (INDEPENDENT_AMBULATORY_CARE_PROVIDER_SITE_OTHER): Admitting: Podiatry

## 2023-11-04 ENCOUNTER — Telehealth: Payer: Self-pay

## 2023-11-04 ENCOUNTER — Encounter: Payer: Self-pay | Admitting: Podiatry

## 2023-11-04 DIAGNOSIS — D2372 Other benign neoplasm of skin of left lower limb, including hip: Secondary | ICD-10-CM

## 2023-11-04 DIAGNOSIS — D2371 Other benign neoplasm of skin of right lower limb, including hip: Secondary | ICD-10-CM | POA: Diagnosis not present

## 2023-11-04 DIAGNOSIS — E0500 Thyrotoxicosis with diffuse goiter without thyrotoxic crisis or storm: Secondary | ICD-10-CM | POA: Diagnosis not present

## 2023-11-04 DIAGNOSIS — B351 Tinea unguium: Secondary | ICD-10-CM

## 2023-11-04 DIAGNOSIS — M79676 Pain in unspecified toe(s): Secondary | ICD-10-CM

## 2023-11-04 LAB — T3, FREE: T3, Free: 3.6 pg/mL (ref 2.3–4.2)

## 2023-11-04 LAB — TSH: TSH: 0.17 m[IU]/L — ABNORMAL LOW (ref 0.40–4.50)

## 2023-11-04 LAB — T4, FREE: Free T4: 1.3 ng/dL (ref 0.8–1.8)

## 2023-11-04 NOTE — Progress Notes (Unsigned)
 She presents today chief complaint of painful calluses plantar aspect of the bilateral foot.  Objective: Pulses are palpable.  There is no erythema edema salines drainage or odor no other lesions or wounds are identified.  She does have multiple benign skin lesions plantar aspect of the forefoot the plantar lakhs of the foot the heel and the toes.  Assessment: Benign skin lesions bilateral.  Plan: Debridement of all benign skin lesions.

## 2023-11-04 NOTE — Telephone Encounter (Signed)
 LVM  for pt to bring her CPAP Machine with her to her appointment on tomorrow.    Sending MyChart message.

## 2023-11-05 ENCOUNTER — Other Ambulatory Visit: Payer: Self-pay | Admitting: Internal Medicine

## 2023-11-05 ENCOUNTER — Ambulatory Visit (INDEPENDENT_AMBULATORY_CARE_PROVIDER_SITE_OTHER): Payer: Medicare Other | Admitting: Adult Health

## 2023-11-05 ENCOUNTER — Encounter: Payer: Self-pay | Admitting: Internal Medicine

## 2023-11-05 VITALS — BP 124/78 | HR 78 | Ht 63.8 in | Wt 189.0 lb

## 2023-11-05 DIAGNOSIS — G4733 Obstructive sleep apnea (adult) (pediatric): Secondary | ICD-10-CM

## 2023-11-05 DIAGNOSIS — E0500 Thyrotoxicosis with diffuse goiter without thyrotoxic crisis or storm: Secondary | ICD-10-CM

## 2023-11-05 MED ORDER — METHIMAZOLE 5 MG PO TABS: 5.0000 mg | ORAL_TABLET | Freq: Every day | ORAL | 1 refills | Status: DC

## 2023-11-05 NOTE — Progress Notes (Signed)
 PATIENT: [name] DOB: Jun 29, 1946  REASON FOR VISIT: follow up HISTORY FROM: patient  Chief Complaint  Patient presents with   RM 4    Patient is here alone for cpap follow-up. She states the machine keeps telling her there is a leak. She took it to her DME but they did not see the message on the screen so they sent her back home. She states the message keeps coming back up when there is water in it. She hasn't used it since 06/06/23. ESS 7   Of note, pt states she woke up with left foot pain. She is going to see PCP tomorrow.    HISTORY OF PRESENT ILLNESS: Today 11/05/23:  [name] is a 78 y.o. female with a history of OSA on CPAP. Returns today for follow-up. Stopped using it because it was stating it had a leak. She went to her DME company but she reports that they just told her to go home and use it. She has the dream wear mask.   Saw foot doctor yesterday and woke up this morning with pain. Was able to get an appointment to be seen tomorrow. I did advise that if the pain worsens or she develops any new symptoms she could go to urgent care.     03/12/23: [name] is a 78 y.o. female with a history of OSA on CPAP. Returns today for follow-up.  She reports that she finds the mask uncomfortable.  Therefore she has not been using it like she should.  She is willing to continue with CPAP if she finds a comfortable mask.  Her download is below     03/14/22: [name] is a 78 year old female with a history of obstructive sleep apnea on CPAP.  She returns today for follow-up.  She reports that there are some nights that she does not use the machine due to frequent trips to the bathroom.  Otherwise she does notice the benefit when she uses the machine.  She was recently diagnosed with breast cancer and will have surgery later this month.  Her download is below    12/22/20: [name] is a 78 year old female with a history of obstructive sleep apnea on CPAP.  She  reports that the CPAP is working well for her.  She denies any new issues.  She joins me today for follow-up.    04/13/20: [name] is a 78 year old female with a history of obstructive sleep apnea on CPAP.  Her download indicates that she used the machine 22 out of 30 days for compliance of 73%.  She used her machine greater than 4 hours 17 days for compliance of 57%.  On average she uses her machine 5 hours and 6 minutes.  Her residual AHI is 1.3 on 5 to 10 cm of water with EPR of 3.  Her leak in the 95th percentile is 31.6 L/min. She returns today for follow-up.  HISTORY 03/05/19:   [name] is a 78 year old female with a history of obstructive sleep apnea on CPAP.  Her download indicates that she used the machine 22 out of 30 days for compliance of 73%.  She used her machine greater than 4 hours 21 days for compliance of 70%.  On average she uses her machine 6 hours and 5 minutes.  Her residual AHI is 1.6 on 5 to 10 cm of water with EPR of 3.  Her leak in the 95th percentile is 29.7 L/min.  REVIEW OF  SYSTEMS: Out of a complete 14 system review of symptoms, the patient complains only of the following symptoms, and all other reviewed systems are negative.  ESS 3  ALLERGIES: Allergies  Allergen Reactions   Dilantin [Phenytoin] Swelling    facial swelling   Latex Hives   Oxycodone  Nausea And Vomiting and Nausea Only    Abdominal Pain, Vomiting   Penicillins Nausea And Vomiting and Swelling    Has patient had a PCN reaction causing immediate rash, facial/tongue/throat swelling, SOB or lightheadedness with hypotension patient had a PCN reaction causing severe rash involving mucus membranes or skin necrosis: [id number] Has patient had a PCN reaction that required hospitalization/No Has patient had a PCN reaction occurring within the last 10 years: No If all of the above answers are "NO", then may proceed with Cephalosporin use.      Codeine  Nausea Only    Tolerates tylenol  with  codeine    Hydrocodone  Nausea And Vomiting   Nickel Hives and Rash   Pregabalin Itching and Other (See Comments)    headaches/problem w/vision    HOME MEDICATIONS: Outpatient Medications Prior to Visit  Medication Sig Dispense Refill   ADVAIR  HFA 230-21 MCG/ACT inhaler Inhale 2 puffs into the lungs 2 (two) times daily. 2 puffs 1-2 times daily depending on disease activity. 36 g 5   albuterol  (PROVENTIL ) (2.5 MG/3ML) 0.083% nebulizer solution Take 3 mLs (2.5 mg total) by nebulization every 6 (six) hours as needed for wheezing or shortness of breath. 150 mL 1   albuterol  (VENTOLIN  HFA) 108 (90 Base) MCG/ACT inhaler INHALE 2 PUFFS BY MOUTH EVERY 6 HOURS AS NEEDED FOR WHEEZING OR SHORTNESS OF BREATH 18 g 5   anastrozole  (ARIMIDEX ) 1 MG tablet TAKE 1 TABLET(1 MG) BY MOUTH DAILY 90 tablet 3   atorvastatin  (LIPITOR) 10 MG tablet TAKE 1 TABLET(10 MG) BY MOUTH DAILY 90 tablet 3   Blood Glucose Monitoring Suppl (ONETOUCH VERIO) w/Device KIT Use to check blood sugar up to 2 times a day 1 kit 0   budesonide  (PULMICORT ) 0.5 MG/2ML nebulizer solution Take 2 mLs (0.5 mg total) by nebulization in the morning and at bedtime. 120 mL 0   cyclobenzaprine  (FLEXERIL ) 10 MG tablet Take 0.5-1 tablets (5-10 mg total) by mouth at bedtime as needed for muscle spasms. 20 tablet 0   desloratadine  (CLARINEX ) 5 MG tablet TAKE 1 TABLET(5 MG) BY MOUTH DAILY 90 tablet 1   EPINEPHrine  0.3 mg/0.3 mL IJ SOAJ injection Inject 0.3 mg into the muscle as needed for anaphylaxis. USE AS DIRECTED FOR LIFE THREATENING ALLERGIC REACTIONS 2 each 1   estradiol  (ESTRACE ) 0.1 MG/GM vaginal cream Place 1 Applicatorful vaginally 2 (two) times a week. (As Needed)     famotidine  (PEPCID ) 40 MG tablet Take 1 tablet (40 mg total) by mouth at bedtime. Take 1 (ONE) tablet by mouth every evening. 90 tablet 1   fluticasone  (FLOVENT  HFA) 220 MCG/ACT inhaler Inhale 2 puffs into the lungs 2 (two) times daily. 2 puffs 2 times daily during flare up. 36 g 1    gabapentin  (NEURONTIN ) 300 MG capsule Take one capsule in the morning, one at lunch and two at bedtime 360 capsule 3   glucose blood (ONETOUCH VERIO) test strip 1 strip 2 (two) times daily     GLYXAMBI  10-5 MG TABS TAKE 1 TABLET BY MOUTH DAILY 30 tablet 5   hydrOXYzine  (ATARAX ) 10 MG tablet Take 1 tablet (10 mg total) by mouth daily as needed for itching. 30 tablet 2  ipratropium (ATROVENT ) 0.06 % nasal spray Place 2 sprays into both nostrils 2 (two) times daily as needed for rhinitis. 45 mL 1   irbesartan  (AVAPRO ) 150 MG tablet TAKE 1 TABLET(150 MG) BY MOUTH DAILY 90 tablet 2   Lactase (DAIRY-RELIEF PO) Take 1 capsule by mouth daily as needed (eating dairy).     Lancets (ONETOUCH DELICA PLUS LANCET33G) MISC USE TO CHECK BLOOD SUGAR UP TO TWICE DAILY 100 each 5   Magnesium 500 MG TABS Take 500 mg by mouth 2 (two) times daily.     Menthol , Topical Analgesic, (ABSORBINE PLUS JR EX) Apply 1 patch topically daily as needed (pain).     Menthol -Methyl Salicylate (SALONPAS JET SPRAY EX) Apply 1 spray topically daily as needed (knee pain).     methimazole  (TAPAZOLE ) 5 MG tablet Take 5 mg by mouth daily.     montelukast  (SINGULAIR ) 10 MG tablet Take 1 tablet (10 mg total) by mouth at bedtime. For asthma control. 90 tablet 1   Mouthwashes (BIOTENE DRY MOUTH MT) Use as directed 1 Dose in the mouth or throat daily as needed (dry mouth).     Nebulizer MISC 1 Device by Does not apply route as directed. 1 each 1   ondansetron  (ZOFRAN -ODT) 4 MG disintegrating tablet Take 1 tablet (4 mg total) by mouth every 8 (eight) hours as needed for nausea or vomiting. 20 tablet 0   pantoprazole  (PROTONIX ) 40 MG tablet Take 1 tablet (40 mg total) by mouth 2 (two) times daily. 180 tablet 1   Plecanatide (TRULANCE) 3 MG TABS Take 3 mg by mouth daily as needed (constipation).     Polyethyl Glycol-Propyl Glycol (SYSTANE OP) Place 1 drop into both eyes daily as needed (dry eyes).     polyethylene glycol powder (GLYCOLAX /MIRALAX )  17 GM/SCOOP powder Take 1 Container by mouth once.     PRESCRIPTION MEDICATION Inhale into the lungs at bedtime. CPAP     Respiratory Therapy Supplies (NEBULIZER MASK ADULT) MISC 1 kit by Does not apply route as directed. 1 each 1   rivaroxaban  (XARELTO ) 20 MG TABS tablet Take 1 tablet (20 mg total) by mouth daily with supper. 90 tablet 1   triamcinolone  (NASACORT ) 55 MCG/ACT AERO nasal inhaler Place 2 sprays into the nose daily. 50.7 mL 1   Vibegron (GEMTESA) 75 MG TABS Take 75 mg by mouth daily.     benzonatate  (TESSALON ) 200 MG capsule Take 1 capsule (200 mg total) by mouth 2 (two) times daily as needed for cough. (Patient not taking: Reported on 11/05/2023) 20 capsule 0   fluconazole  (DIFLUCAN ) 150 MG tablet Take one pill by mouth now and repeat dose in 3 days (Patient not taking: Reported on 11/05/2023) 2 tablet 0   predniSONE  (DELTASONE ) 20 MG tablet Take 1 tablet (20 mg total) by mouth daily with breakfast. (Patient not taking: Reported on 11/05/2023) 5 tablet 0   Facility-Administered Medications Prior to Visit  Medication Dose Route Frequency Provider Last Rate Last Admin   tezepelumab -ekko (TEZSPIRE ) 210 MG/1. syringe 210 mg  210 mg Subcutaneous Q28 days Fabienne Holter, MD   210 mg at 10/22/23 0947    PAST MEDICAL HISTORY: Past Medical History:  Diagnosis Date   Allergic rhinitis    Allergy    Arthritis    Asthma    Breast cancer (HCC)    Chronic headache    Diabetes mellitus    Ductal carcinoma in situ (DCIS) of left breast 02/23/2022   DVT (deep venous thrombosis) (HCC)  Dyspnea    Heart murmur    History of radiation therapy    Left Breast 05/02/22-05/30/22- Dr. Retta Caster   Hypertension    Penicillin allergy 01/19/2020   Pulmonary embolism (HCC)    Pulmonary hypertension (HCC)    Seizures (HCC)     PAST SURGICAL HISTORY: Past Surgical History:  Procedure Laterality Date   ABDOMINAL HYSTERECTOMY     partial, has ovaries   BREAST LUMPECTOMY WITH RADIOACTIVE  SEED LOCALIZATION Left 03/29/2022   Procedure: LEFT BREAST LUMPECTOMY WITH RADIOACTIVE SEED LOCALIZATION;  Surgeon: Sim Dryer, MD;  Location: [facility] OR;  Service: General;  Laterality: Left;   CARDIAC CATHETERIZATION  12/28/2010   Mod. pulmonary hypertension, normal coronary arteries   CARDIOVERSION N/A 11/23/2020   Procedure: CARDIOVERSION;  Surgeon: Luana Rumple, MD;  Location: [facility];  Service: Cardiovascular;  Laterality: N/A;   CHOLECYSTECTOMY N/A 12/20/2021   Procedure: LAPAROSCOPIC CHOLECYSTECTOMY WITH INTRAOPERATIVE CHOLANGIOGRAM;  Surgeon: Oralee Billow, MD;  Location: [facility] ORS;  Service: General;  Laterality: N/A;   DOPPLER ECHOCARDIOGRAPHY  10/08/2011   EF=>55%,mild asymmetric LVH, mod. TR, mod. PH, mild to mod LA dilatation   ESOPHAGEAL MANOMETRY N/A 06/19/2023   Procedure: ESOPHAGEAL MANOMETRY (EM);  Surgeon: Genell Ken, MD;  Location: [facility];  Service: Gastroenterology;  Laterality: N/A;   IR LUMBAR DISC ASPIRATION W/IMG GUIDE  01/12/2020   KNEE ARTHROSCOPY Left    KNEE SURGERY     Nuclear Stress Test  05/20/2006   No ischemia   PARTIAL HYSTERECTOMY     PLANTAR FASCIA SURGERY     TONSILLECTOMY      FAMILY HISTORY: Family History  Problem Relation Age of Onset   Cancer Mother        breast and colon cancer   Hypertension Mother    Clotting disorder Mother    Breast cancer Mother 50   Arthritis Mother    Stroke Mother    Diabetes Mother    Colon cancer Mother 21 - 68   Alcohol  abuse Father    Cancer Sister        breast cancer   Multiple sclerosis Sister    Cancer Sister        colon cancer   Cancer Brother        colon cancer   Prostate cancer Half-Brother    Stomach cancer Half-Brother    Breast cancer Half-Sister 49       recurrence at 39, reports positive genetic testing   Arthritis Half-Sister    Diabetes Half-Sister    Colon cancer Half-Sister 39   Allergies Other        grandson   Allergic rhinitis Neg Hx    Angioedema Neg Hx    Asthma Neg  Hx    Atopy Neg Hx    Eczema Neg Hx    Immunodeficiency Neg Hx    Urticaria Neg Hx     SOCIAL HISTORY: Social History   Socioeconomic History   Marital status: Widowed    Spouse name: Not on file   Number of children: Y   Years of education: Not on file   Highest education level: Not on file  Occupational History   Occupation: retired Programme researcher, broadcasting/film/video.   Tobacco Use   Smoking status: Never    Passive exposure: Never   Smokeless tobacco: Never  Vaping Use   Vaping status: Never Used  Substance and Sexual Activity   Alcohol  use: Not Currently    Alcohol /week: 0.0 standard drinks of alcohol   Comment: occ glass on wine   Drug use: No   Sexual activity: Never  Other Topics Concern   Not on file  Social History Narrative   Widow    limited exercise.   Social Drivers of Corporate investment banker Strain: Low Risk  (05/16/2023)   Overall Financial Resource Strain (CARDIA)    Difficulty of Paying Living Expenses: Not hard at all  Food Insecurity: No Food Insecurity (05/16/2023)   Hunger Vital Sign    Worried About Running Out of Food in the Last Year: Never true    Ran Out of Food in the Last Year: Never true  Transportation Needs: No Transportation Needs (05/16/2023)   PRAPARE - Administrator, Civil Service (Medical): No    Lack of Transportation (Non-Medical): No  Physical Activity: Insufficiently Active (05/16/2023)   Exercise Vital Sign    Days of Exercise per Week: 2 days    Minutes of Exercise per Session: 30 min  Stress: No Stress Concern Present (05/16/2023)   [employer] of Occupational Health - Occupational Stress Questionnaire    Feeling of Stress : Only a little  Social Connections: Moderately Isolated (05/16/2023)   Social Connection and Isolation Panel [NHANES]    Frequency of Communication with Friends and Family: More than three times a week    Frequency of Social Gatherings with Friends and Family: Once a week    Attends Religious  Services: More than 4 times per year    Active Member of [employer] or Organizations: No    Attends Banker Meetings: Never    Marital Status: Widowed  Intimate Partner Violence: Not At Risk (05/16/2023)   Humiliation, Afraid, Rape, and Kick questionnaire    Fear of Current or Ex-Partner: No    Emotionally Abused: No    Physically Abused: No    Sexually Abused: No      PHYSICAL EXAM  Vitals:   11/05/23 1034  BP: 124/78  Pulse: 78  Weight: 189 lb (85.7 kg)  Height: 5' 3.8" (1.621 m)     Body mass index is 32.65 kg/m.  Generalized: Well developed, in no acute distress  Chest: Lungs clear to auscultation bilaterally  Neurological examination  Mentation: Alert oriented to time, place, history taking. Follows all commands speech and language fluent Cranial nerve II-XII: Facial symmetry noted   DIAGNOSTIC DATA (LABS, IMAGING, TESTING) - I reviewed patient records, labs, notes, testing and imaging myself where available.  Lab Results  Component Value Date   WBC 6.7 07/04/2023   HGB 11.6 (L) 07/04/2023   HCT 35.1 (L) 07/04/2023   MCV 73.4 (L) 07/04/2023   PLT 244 07/04/2023      Component Value Date/Time   NA 139 07/04/2023 1610   NA 140 11/21/2020 1026   K 4.5 07/04/2023 1610   CL 104 07/04/2023 1610   CO2 28 07/04/2023 1610   GLUCOSE 90 07/04/2023 1610   BUN 12 07/04/2023 1610   BUN 16 11/21/2020 1026   CREATININE 0.96 07/04/2023 1610   CREATININE 1.03 (H) 02/28/2022 0824   CREATININE 1.01 (H) 12/30/2020 1635   CALCIUM  9.3 07/04/2023 1610   PROT 5.7 (L) 04/26/2023 0943   ALBUMIN  3.9 04/26/2023 0943   AST 20 04/26/2023 0943   AST 16 02/28/2022 0824   ALT 12 04/26/2023 0943   ALT 10 02/28/2022 0824   ALKPHOS 81 04/26/2023 0943   BILITOT 1.5 (H) 04/26/2023 0943   BILITOT 0.8 02/28/2022 0824   GFRNONAA >60  07/04/2023 1610   GFRNONAA 57 (L) 02/28/2022 0824   GFRNONAA 53 (L) 01/04/2020 1059   GFRAA 62 01/04/2020 1059   Lab Results  Component  Value Date   CHOL 72 04/26/2023   HDL 40.50 04/26/2023   LDLCALC 24 04/26/2023   TRIG 38.0 04/26/2023   CHOLHDL 2 04/26/2023   Lab Results  Component Value Date   HGBA1C 5.6 08/02/2023   Lab Results  Component Value Date   VITAMINB12 776 01/12/2021   Lab Results  Component Value Date   TSH 0.17 (L) 11/04/2023      ASSESSMENT AND PLAN 78 y.o. year old female  has a past medical history of Allergic rhinitis, Allergy, Arthritis, Asthma, Breast cancer (HCC), Chronic headache, Diabetes mellitus, Ductal carcinoma in situ (DCIS) of left breast (02/23/2022), DVT (deep venous thrombosis) (HCC), Dyspnea, Heart murmur, History of radiation therapy, Hypertension, Penicillin allergy (01/19/2020), Pulmonary embolism (HCC), Pulmonary hypertension (HCC), and Seizures (HCC). here with:  OSA on CPAP  - noncompliant with CPAP due to leak.  - Encouraged her to check her mask when she puts it on to see if it is leaking.  If so she can try adjusting the straps.  If she still has a leak she was advised to let me know and I will put in a order for mask refitting - Encourage patient to use CPAP nightly and > 4 hours each night - F/U in 6 to 7 months or sooner if needed    Clem Currier, MSN, NP-C 11/05/2023, 10:39 AM [facility] [address] [place], [place] [address] [phone]

## 2023-11-05 NOTE — Patient Instructions (Signed)
 Restart CPAP If mask is leaking- try adjusting straps, if still leaking then I can send order for mask refitting.  If your symptoms worsen or you develop new symptoms please let us  know.

## 2023-11-06 ENCOUNTER — Ambulatory Visit (INDEPENDENT_AMBULATORY_CARE_PROVIDER_SITE_OTHER): Admitting: Podiatry

## 2023-11-06 ENCOUNTER — Ambulatory Visit: Admitting: Internal Medicine

## 2023-11-06 ENCOUNTER — Encounter: Payer: Self-pay | Admitting: Podiatry

## 2023-11-06 DIAGNOSIS — M7752 Other enthesopathy of left foot: Secondary | ICD-10-CM | POA: Diagnosis not present

## 2023-11-06 DIAGNOSIS — M778 Other enthesopathies, not elsewhere classified: Secondary | ICD-10-CM

## 2023-11-06 NOTE — Addendum Note (Signed)
 Addended by: Pheobe Brass E on: 11/06/2023 11:50 AM   Modules accepted: Level of Service

## 2023-11-06 NOTE — Progress Notes (Signed)
 She presents today states that she woke up yesterday with her left foot and severe pain.  She denies any trauma states it was red hot and swollen and she can barely walk on it.  States that starting to feel much better today.  Objective: Vital signs are stable she is alert and oriented x 3 she has the majority of her pain is overlying her sinus tarsi subtalar joint.  She has mild erythema overlying the fibula.  No pain on medial-lateral compression of the fibula and tibia.  She does have some tenderness on direct palpation of the sinus tarsi and on range of motion particularly end range of motion of the subtalar joint left.  Assessment: Subtalar joint capsulitis possible osteoarthritic change possible gout.  Plan: Discussed etiology pathology conservative versus surgical therapies I injected the subtalar joint today after sterile Betadine skin prep with local anesthetic and 20 mg of Kenalog .  She tolerated the procedure well without complications.  Noted that she was nearly 100% pain-free as she left the office.

## 2023-11-07 ENCOUNTER — Telehealth: Payer: Self-pay | Admitting: Adult Health

## 2023-11-07 DIAGNOSIS — M7752 Other enthesopathy of left foot: Secondary | ICD-10-CM | POA: Diagnosis not present

## 2023-11-07 DIAGNOSIS — G4733 Obstructive sleep apnea (adult) (pediatric): Secondary | ICD-10-CM

## 2023-11-07 MED ORDER — TRIAMCINOLONE ACETONIDE 40 MG/ML IJ SUSP
20.0000 mg | Freq: Once | INTRAMUSCULAR | Status: AC
  Administered 2023-11-07: 20 mg

## 2023-11-07 NOTE — Telephone Encounter (Signed)
 Pt called stating that she would like to speak to the Provider regarding her cpap machine. She states something came up on the machine and she said the provider told her to call if something happened and she would help her figure it out. Pt has not called DME and only wants to speak to the provider. Please advise.

## 2023-11-07 NOTE — Telephone Encounter (Addendum)
 Called pt and asked her what her machine was doing and pt stated that is  is giving her a message last night saying "error 006" and it won't work, or let her use her machine. Pt states that the message won't turn off.   Will call pt's DME company to see what pt needs to do about her Machine.

## 2023-11-07 NOTE — Addendum Note (Signed)
 Addended by: Sanda Crome on: 11/07/2023 08:38 AM   Modules accepted: Orders, Level of Service

## 2023-11-11 ENCOUNTER — Ambulatory Visit

## 2023-11-11 DIAGNOSIS — R1313 Dysphagia, pharyngeal phase: Secondary | ICD-10-CM | POA: Diagnosis not present

## 2023-11-11 DIAGNOSIS — R498 Other voice and resonance disorders: Secondary | ICD-10-CM | POA: Diagnosis not present

## 2023-11-11 DIAGNOSIS — R1310 Dysphagia, unspecified: Secondary | ICD-10-CM | POA: Diagnosis not present

## 2023-11-11 NOTE — Therapy (Addendum)
 OUTPATIENT SPEECH LANGUAGE PATHOLOGY SWALLOW TREATMENT   Patient Name: [name] MRN: [id number] DOB:[birth year], 78 y.o., female Today's Date: 11/11/2023  PCP: Judithann Novas MD REFERRING PROVIDER: Fabienne Holter, MD  END OF SESSION:  End of Session - 11/11/23 0805     Visit Number 3    Number of Visits 17    Date for SLP Re-Evaluation 12/18/23    Authorization Type Medicare    SLP Start Time 0805   late arrival   SLP Stop Time  0845    SLP Time Calculation (min) 40 min    Activity Tolerance Patient tolerated treatment well              Past Medical History:  Diagnosis Date   Allergic rhinitis    Allergy    Arthritis    Asthma    Breast cancer (HCC)    Chronic headache    Diabetes mellitus    Ductal carcinoma in situ (DCIS) of left breast 02/23/2022   DVT (deep venous thrombosis) (HCC)    Dyspnea    Heart murmur    History of radiation therapy    Left Breast 05/02/22-05/30/22- Dr. Retta Caster   Hypertension    Penicillin allergy 01/19/2020   Pulmonary embolism (HCC)    Pulmonary hypertension (HCC)    Seizures (HCC)    Past Surgical History:  Procedure Laterality Date   ABDOMINAL HYSTERECTOMY     partial, has ovaries   BREAST LUMPECTOMY WITH RADIOACTIVE SEED LOCALIZATION Left 03/29/2022   Procedure: LEFT BREAST LUMPECTOMY WITH RADIOACTIVE SEED LOCALIZATION;  Surgeon: Sim Dryer, MD;  Location: [facility] OR;  Service: General;  Laterality: Left;   CARDIAC CATHETERIZATION  12/28/2010   Mod. pulmonary hypertension, normal coronary arteries   CARDIOVERSION N/A 11/23/2020   Procedure: CARDIOVERSION;  Surgeon: Luana Rumple, MD;  Location: [facility];  Service: Cardiovascular;  Laterality: N/A;   CHOLECYSTECTOMY N/A 12/20/2021   Procedure: LAPAROSCOPIC CHOLECYSTECTOMY WITH INTRAOPERATIVE CHOLANGIOGRAM;  Surgeon: Oralee Billow, MD;  Location: [facility];  Service: General;  Laterality: N/A;   DOPPLER ECHOCARDIOGRAPHY  10/08/2011   EF=>55%,mild asymmetric LVH, mod.  TR, mod. PH, mild to mod LA dilatation   ESOPHAGEAL MANOMETRY N/A 06/19/2023   Procedure: ESOPHAGEAL MANOMETRY (EM);  Surgeon: Genell Ken, MD;  Location: [facility];  Service: Gastroenterology;  Laterality: N/A;   IR LUMBAR DISC ASPIRATION W/IMG GUIDE  01/12/2020   KNEE ARTHROSCOPY Left    KNEE SURGERY     Nuclear Stress Test  05/20/2006   No ischemia   PARTIAL HYSTERECTOMY     PLANTAR FASCIA SURGERY     TONSILLECTOMY     Patient Active Problem List   Diagnosis Date Noted   Acute insomnia 09/26/2023   Vaginal discomfort 06/12/2023   Traumatic hematoma of right knee 05/03/2023   Urinary frequency 05/03/2023   Pruritus 09/04/2022   SSS (sick sinus syndrome) (HCC) 04/26/2022   Family history of breast cancer 02/28/2022   Family history of colon cancer in mother 02/28/2022   Ductal carcinoma in situ (DCIS) of left breast 02/23/2022   Family history of malignant neoplasm of digestive organs 02/02/2022   Anemia 12/29/2021   Chronic constipation 12/29/2021   Chronic anticoagulation - Xerelto 12/18/2021   Hyperthyroidism 12/18/2021   Status post total left knee replacement 12/09/2021   Presence of right artificial knee joint 10/30/2021   Constipation by delayed colonic transit 09/25/2021   Atypical atrial flutter (HCC) 06/24/2021   Chronic diastolic heart failure (HCC) 06/24/2021   Stenosing  tenosynovitis of finger of right hand 09/06/2020   Type 2 diabetes mellitus with diabetic neuropathy, unspecified (HCC) 06/20/2020   Acute pain of left wrist 06/03/2020   Dysphonia 04/08/2020   Vaginal irritation 02/16/2020   Discitis of lumbosacral region 01/04/2020   Hyperlipidemia associated with type 2 diabetes mellitus (HCC) 05/07/2019   Obstructive sleep apnea treated with continuous positive airway pressure (CPAP) 06/03/2018   Left thyroid  nodule 02/06/2018   Insomnia secondary to chronic pain 01/26/2018   Class 3 drug-induced obesity with serious comorbidity and body mass index (BMI) of  40.0 to 44.9 in adult (HCC) 12/03/2017   Graves disease 10/15/2017   Acute diarrhea 01/04/2017   Dry mouth 11/01/2016   Bilateral high frequency sensorineural hearing loss 10/24/2016   Subjective tinnitus, bilateral 10/24/2016   Other fatigue 12/09/2015   Moderate persistent asthma 07/12/2015   Paroxysmal atrial fibrillation (HCC) 06/03/2015   Neuropathy due to secondary diabetes (HCC) 01/11/2015   Migraine headache without aura 08/24/2014   Vaginal candida 06/07/2014   Allergic sinusitis 03/25/2014   DVT (deep venous thrombosis) hx of  03/25/2014   Osteoarthritis of right knee 03/25/2014    ? of Seizure disorder 03/25/2014   Depression, major, in remission (HCC) 03/25/2014   Hypertension associated with diabetes (HCC) 03/25/2014   GERD (gastroesophageal reflux disease) 03/25/2014   History of DVT (deep vein thrombosis) 03/25/2014   History of pulmonary embolism 10/09/2013   Asthma, moderate persistent 01/24/2011   Pulmonary hypertension (HCC) 01/11/2011    ONSET DATE: 10/14/2023 (referral date)   REFERRING DIAG: R13.10 (ICD-10-CM) - Swallowing dysfunction  THERAPY DIAG: Dysphagia, pharyngeal phase  Other voice and resonance disorders  Rationale for Evaluation and Treatment: Rehabilitation  SUBJECTIVE:   SUBJECTIVE STATEMENT: Endorsed "less hawking" since last session  Pt accompanied by: self  PERTINENT HISTORY: "Pt is a 78 yo female referred for MBS and esophagram by Dr Jerelene Monday. Pt with PMH + for GERD, breast cancer, DVT, seizures, pulmonary HTN, allergic rhinitis, dyspnea, heart mumur, radiation therapy 05/02/2022-05/30/2022 left breast. Pt describes dysphagia to MD as mucus in the throat with frequent throat clearing. ? h/o facial droop and dysarthria on 07/04/23. Has xerostomia, S/P manometry showing nonspecific esoph dysmotiltiy, imcomplete clearance of allow swallows, normal resting LES, slight increased pressure for relaxation of LES, intact peristalsis 06/19/2023.  Esophagram completed in 2024 showed penetration of liquids but no aspiration. Patient does endorse some coughing with liquid intake. Patient reports significant issues with coughing and states her allergist informed her to swallow when this occurs but she states this is difficult due to her dry mouth. And if she swallows water it may cause her to cough. SLP advised to consider using sugar-free lemon drops if Dr. Jerelene Monday is agreeable. She does endorse some voice changes at times. Reports she takes her PPI twice a day and her famotidine  at night. SLP asked if she ever drools to which patient stated on occasion when chewing gum noting this occurs more on the left side. Does appear with slight facial asymmetry on the left with decreased labial closure when taxed with resistance. Denies having any pneumonia stating she got the pneumonia shot."  PAIN: Are you having pain? No   PATIENT GOAL's: Pt shared hopes to improve her swallow and reduce habitual throat clearing  OBJECTIVE:  Note: Objective measures were completed at Evaluation unless otherwise noted. OBJECTIVE:   INSTRUMENTAL SWALLOW STUDY FINDINGS (MBSS 10/03/23) Clinical Impression: Patient presents with mild pharyngeal dysphagia mostly characterized by inadequate laryngeal elevation and closure resulting in  minimal trace aspiration of thin liquids on an inconsistent basis.  Chin tuck posture not helpful as it allowed barium to spill into open larynx during the swallow.   Using thin liquid to wash down food worsened penetration and aspiration.  Though patient does not sense trace aspiration cued cough is effective to help her to clear.  Pharyngeal swallow is strong fortunately.  Trace retention with liquids at vallecula and piriform sinus likely due to pressure issue from known esophageal deficits.     Patient denies any issues with pulmonary infections.  No aspiration or deep penetration of any other consistencies.  Patient was tested both with thin and  ultrathin (thin barium halfed with water) -and no significant difference with airway protection noted.     Advised patient if consumption of coffee with large amounts of cream or slightly thicker liquids such as orange juice or Ensure, Glucerna are more comfortable for her to swallow -she may wish to drink these during her meals.  Encouraged her to drink water to help with hydration and advised that water is pH neutral thus safest liquid to aspirate.  She endorses taking her medications with applesauce for ease of swallow.  Recommended that if she does that to start intake with liquid and drink liquids after swallowing medications with applesauce.  Thank you so much for this consult.    ESOPHAGRAM IMPRESSION (10/03/23): Mild-to-moderate esophageal dysmotility.                                                                                           TREATMENT DATE:  11-11-23: Hoarse vocal quality present today. Endorsed voice is worse in morning. ENT visit scheduled late May. Reported completion of swallow HEP, at least 20x/day, but often times more frequently throughout day. Able to teach back and demo swallow exercises x3 with mod I. Did not recall effortful swallows, with re-education provided this date. Pt completed x20 reps with mod I. Reported suspected s/sx of acid reflux. RSI score of 24 (score of >13 indicative of LPR). Provided instruction and handout of reflux management strategies. Pt verbalized understanding and appreciation. Endorsed overall improvements in swallowing, including being more intentional with duration of mastication and taking pills with puree. Only one throat clear evidenced today but pt aware and able to implement strategy following episode.   10-28-23: Initiated training in throat clear alternatives including hard swallow, sips, sniff-sniff-swallow - and that she may have to do this several times until the sensation passes. We discussed that reflux medications stop acid but they  don't eliminate the reflux - information provided on LPR and chronic throat clearing. Initiate HEP for dysphagia - Elysha demonstrated effortful swallow, Masko, Mendelson and tongue press with rare min A after initial instruction and modeling. Reviewed swallow precautions - Lovely is taking meds with puree - instructed her to swallow with intent, be mindful when eating with no distractions and to use effortful swallow with meals. Instructed her to be mindful to chew and swallow, then talk especially when eating out as there are more distractions.   10-23-23: Eval and POC completed. Initiated education of MBSS results and negative impact of habitual  throat clearing. Planning to address throat clear alternatives next session. Recommended pt visit ENT, specifically laryngologist, to further investigate concerns with vocal quality and reported excess mucus.  PATIENT EDUCATION: Education details: see above Person educated: Patient Education method: Explanation Education comprehension: verbalized understanding and needs further education  ASSESSMENT:  CLINICAL IMPRESSION: Patient is a 78 y.o. M who was seen today for treatment. Continued education and instruction of swallow exercises, throat clear alternatives, and reflux management. Pt able to demo with rare min A. Pt would benefit from skilled ST to address dysphagia and voice to optimize safety during swallowing and vocal hygiene.  OBJECTIVE IMPAIRMENTS: include voice disorder and dysphagia. These impairments are limiting patient from effectively communicating at home and in community and safety when swallowing. Factors affecting potential to achieve goals and functional outcome are ability to learn/carryover information and cooperation/participation level. Patient will benefit from skilled SLP services to address above impairments and improve overall function.  REHAB POTENTIAL: Good   GOALS: Goals reviewed with patient? Yes  SHORT TERM GOALS:  Target date: 11/20/2023    Pt will accurately demonstrate swallowing exercises with min-A across 2/2 sessions Baseline: 11/11/23 Goal status: ONGOING  2.  Pt will report daily carryover of HEP across 2/2 sessions Baseline: 11/11/23 Goal status: ONGOING  3.  Pt will successfully suppress throat clearing across 2/2 sessions with min-A Baseline:  Goal status: ONGOING  4.  Pt will demonstrate understanding of swallow compensations or strategies via teach back with mod-I. Baseline:  Goal status: ONGOING  5.    Pt will verbalize 3 reflux precautions with mod I Baseline:  Goal status: ONGOING   LONG TERM GOALS: Target date: 12/18/2023  Pt will report reduction in frequency of throat clearing > 1 week with use of trained techniques  Baseline:  Goal status: ONGOING  2.  Pt will report improved score of PROM by at least 2 points by LTG date. Baseline: EAT-10=8 Goal status: ONGOING  3.  Pt will demonstrate swallowing exercises with mod-I across 2/2 sessions. Baseline:  Goal status: ONGOING  4.    Pt will be independent with HEP Baseline:  Goal status: ONGOING  PLAN:  SLP FREQUENCY: 2x/week  SLP DURATION: 8 weeks  PLANNED INTERVENTIONS: Aspiration precaution training, Pharyngeal strengthening exercises, Environmental controls, Cueing hierachy, SLP instruction and feedback, Compensatory strategies, Patient/family education, and [id number] Treatment of speech (30 or 45 min)     Tamar Fairly, CCC-SLP 11/11/2023, 8:52 AM

## 2023-11-11 NOTE — Patient Instructions (Addendum)
?

## 2023-11-11 NOTE — Telephone Encounter (Signed)
 New, [name], RN; [name]; Tucker, Dolanda; 1 other Received, thank you!     Previous Messages    ----- Message ----- From: [name], RN Sent: 11/11/2023   1:02 PM EDT To: [name]; [name]; [name]; * Subject: her cpap machine                              Good afternoon,  I'm checking regarding this pts machine,    I see that she 03-18-2018 added (so her machine is over 77 yrs old?)  Pt  says her machine says:  "error 006" and it won't work, or let her use her machine. Pt states that the message won't turn off.   [name]. [name] Female, 78 y.o., 1946/02/25 MRN: [id number] Code: Prior   Raynelle Callow RN

## 2023-11-11 NOTE — Telephone Encounter (Signed)
 I sent message to [employer] about her machine.

## 2023-11-13 ENCOUNTER — Encounter: Payer: Self-pay | Admitting: Speech Pathology

## 2023-11-13 ENCOUNTER — Ambulatory Visit (INDEPENDENT_AMBULATORY_CARE_PROVIDER_SITE_OTHER): Admitting: Podiatry

## 2023-11-13 ENCOUNTER — Encounter: Payer: Self-pay | Admitting: Podiatry

## 2023-11-13 ENCOUNTER — Ambulatory Visit: Admitting: Speech Pathology

## 2023-11-13 DIAGNOSIS — M2042 Other hammer toe(s) (acquired), left foot: Secondary | ICD-10-CM

## 2023-11-13 DIAGNOSIS — L84 Corns and callosities: Secondary | ICD-10-CM

## 2023-11-13 DIAGNOSIS — R498 Other voice and resonance disorders: Secondary | ICD-10-CM | POA: Diagnosis not present

## 2023-11-13 DIAGNOSIS — R1310 Dysphagia, unspecified: Secondary | ICD-10-CM | POA: Diagnosis not present

## 2023-11-13 DIAGNOSIS — R1313 Dysphagia, pharyngeal phase: Secondary | ICD-10-CM

## 2023-11-13 DIAGNOSIS — M2041 Other hammer toe(s) (acquired), right foot: Secondary | ICD-10-CM

## 2023-11-13 DIAGNOSIS — E1142 Type 2 diabetes mellitus with diabetic polyneuropathy: Secondary | ICD-10-CM

## 2023-11-13 DIAGNOSIS — M7752 Other enthesopathy of left foot: Secondary | ICD-10-CM

## 2023-11-13 NOTE — Telephone Encounter (Signed)
 Pt here checking on her machine.  I called Customer service at [employer]. I spoke with Shay, and she said pt could bring her machine in for them to check, needs appt. Her set up date 03-11-2018, so is eligible for new machine.  She was last seen here 11-05-2023 by MM/NP she was not using so a restart cpap order placed.  Pt told that I will check with MM/NP if can order new machine and needing new sleep test. Will let pt know after hear back from [facility] NP.

## 2023-11-13 NOTE — Patient Instructions (Addendum)
   The Protonix  and Pepcid  get rid of acid, but they do not stop reflux from coming up   Have [name] research this, as well as Laryngopharyngeal Reflux (LPR) in light of hoarseness, throat clearing, mucus, coughing  Elevate Head of Bed 6 inches with risers or blocks - this helps gravity keep reflux down with you lay down

## 2023-11-13 NOTE — Telephone Encounter (Signed)
 Yes ok to order HST

## 2023-11-13 NOTE — Telephone Encounter (Signed)
 I called and LMVM for pt.  Megan,NP consulted and okd to do a HST, since her machine not working and is eligible for a new machine.  I relayed that an appt at [facility] for them to check her machine, she would need to call and set that up.  But for now HST ordered and sleep lab to authorize then call her.  She is to call back if any questions.

## 2023-11-13 NOTE — Progress Notes (Signed)
 Patient given Rx to [facility] to get diabetic shoes and insoles, along with today's chart note.

## 2023-11-13 NOTE — Telephone Encounter (Signed)
 Helane Lloyd; [name], RN; Mart Skill; Tucker, Dolanda; 1 other Hello Adell Hones,  Patient was setup on 03-11-2018 so yesshe is over her 5 years. We will need the follwoing to process for a new papa unit. 1. a new order with numerical settings 2. OV face 2 face note showing usage and benefit with in 6 months due to her ins type.  I will also request someone reach out to do a trouble shoot view in the mean time.  Thank you,  Rodman Clam New     Prev

## 2023-11-13 NOTE — Addendum Note (Signed)
 Addended by: [name] on: 11/13/2023 01:40 PM   Modules accepted: Orders

## 2023-11-13 NOTE — Therapy (Signed)
 OUTPATIENT SPEECH LANGUAGE PATHOLOGY SWALLOW TREATMENT   Patient Name: [name] MRN: [id number] DOB:07-31-1945, 78 y.o., female Today's Date: 11/13/2023  PCP: Judithann Novas MD REFERRING PROVIDER: Fabienne Holter, MD  END OF SESSION:  End of Session - 11/13/23 0937     Visit Number 4    Number of Visits 17    Date for SLP Re-Evaluation 12/18/23    Authorization Type Medicare    SLP Start Time 0930    SLP Stop Time  1015    SLP Time Calculation (min) 45 min    Activity Tolerance Patient tolerated treatment well              Past Medical History:  Diagnosis Date   Allergic rhinitis    Allergy    Arthritis    Asthma    Breast cancer (HCC)    Chronic headache    Diabetes mellitus    Ductal carcinoma in situ (DCIS) of left breast 02/23/2022   DVT (deep venous thrombosis) (HCC)    Dyspnea    Heart murmur    History of radiation therapy    Left Breast 05/02/22-05/30/22- Dr. Retta Caster   Hypertension    Penicillin allergy 01/19/2020   Pulmonary embolism (HCC)    Pulmonary hypertension (HCC)    Seizures (HCC)    Past Surgical History:  Procedure Laterality Date   ABDOMINAL HYSTERECTOMY     partial, has ovaries   BREAST LUMPECTOMY WITH RADIOACTIVE SEED LOCALIZATION Left 03/29/2022   Procedure: LEFT BREAST LUMPECTOMY WITH RADIOACTIVE SEED LOCALIZATION;  Surgeon: Sim Dryer, MD;  Location: [facility] OR;  Service: General;  Laterality: Left;   CARDIAC CATHETERIZATION  12/28/2010   Mod. pulmonary hypertension, normal coronary arteries   CARDIOVERSION N/A 11/23/2020   Procedure: CARDIOVERSION;  Surgeon: Luana Rumple, MD;  Location: [facility];  Service: Cardiovascular;  Laterality: N/A;   CHOLECYSTECTOMY N/A 12/20/2021   Procedure: LAPAROSCOPIC CHOLECYSTECTOMY WITH INTRAOPERATIVE CHOLANGIOGRAM;  Surgeon: Oralee Billow, MD;  Location: [facility];  Service: General;  Laterality: N/A;   DOPPLER ECHOCARDIOGRAPHY  10/08/2011   EF=>55%,mild asymmetric LVH, mod. TR, mod. PH,  mild to mod LA dilatation   ESOPHAGEAL MANOMETRY N/A 06/19/2023   Procedure: ESOPHAGEAL MANOMETRY (EM);  Surgeon: Genell Ken, MD;  Location: [facility];  Service: Gastroenterology;  Laterality: N/A;   IR LUMBAR DISC ASPIRATION W/IMG GUIDE  01/12/2020   KNEE ARTHROSCOPY Left    KNEE SURGERY     Nuclear Stress Test  05/20/2006   No ischemia   PARTIAL HYSTERECTOMY     PLANTAR FASCIA SURGERY     TONSILLECTOMY     Patient Active Problem List   Diagnosis Date Noted   Acute insomnia 09/26/2023   Vaginal discomfort 06/12/2023   Traumatic hematoma of right knee 05/03/2023   Urinary frequency 05/03/2023   Pruritus 09/04/2022   SSS (sick sinus syndrome) (HCC) 04/26/2022   Family history of breast cancer 02/28/2022   Family history of colon cancer in mother 02/28/2022   Ductal carcinoma in situ (DCIS) of left breast 02/23/2022   Family history of malignant neoplasm of digestive organs 02/02/2022   Anemia 12/29/2021   Chronic constipation 12/29/2021   Chronic anticoagulation - Xerelto 12/18/2021   Hyperthyroidism 12/18/2021   Status post total left knee replacement 12/09/2021   Presence of right artificial knee joint 10/30/2021   Constipation by delayed colonic transit 09/25/2021   Atypical atrial flutter (HCC) 06/24/2021   Chronic diastolic heart failure (HCC) 06/24/2021   Stenosing tenosynovitis of finger  of right hand 09/06/2020   Type 2 diabetes mellitus with diabetic neuropathy, unspecified (HCC) 06/20/2020   Acute pain of left wrist 06/03/2020   Dysphonia 04/08/2020   Vaginal irritation 02/16/2020   Discitis of lumbosacral region 01/04/2020   Hyperlipidemia associated with type 2 diabetes mellitus (HCC) 05/07/2019   Obstructive sleep apnea treated with continuous positive airway pressure (CPAP) 06/03/2018   Left thyroid  nodule 02/06/2018   Insomnia secondary to chronic pain 01/26/2018   Class 3 drug-induced obesity with serious comorbidity and body mass index (BMI) of 40.0 to 44.9  in adult (HCC) 12/03/2017   Graves disease 10/15/2017   Acute diarrhea 01/04/2017   Dry mouth 11/01/2016   Bilateral high frequency sensorineural hearing loss 10/24/2016   Subjective tinnitus, bilateral 10/24/2016   Other fatigue 12/09/2015   Moderate persistent asthma 07/12/2015   Paroxysmal atrial fibrillation (HCC) 06/03/2015   Neuropathy due to secondary diabetes (HCC) 01/11/2015   Migraine headache without aura 08/24/2014   Vaginal candida 06/07/2014   Allergic sinusitis 03/25/2014   DVT (deep venous thrombosis) hx of  03/25/2014   Osteoarthritis of right knee 03/25/2014    ? of Seizure disorder 03/25/2014   Depression, major, in remission (HCC) 03/25/2014   Hypertension associated with diabetes (HCC) 03/25/2014   GERD (gastroesophageal reflux disease) 03/25/2014   History of DVT (deep vein thrombosis) 03/25/2014   History of pulmonary embolism 10/09/2013   Asthma, moderate persistent 01/24/2011   Pulmonary hypertension (HCC) 01/11/2011    ONSET DATE: 10/14/2023 (referral date)   REFERRING DIAG: R13.10 (ICD-10-CM) - Swallowing dysfunction  THERAPY DIAG: Dysphagia, pharyngeal phase  Other voice and resonance disorders  Rationale for Evaluation and Treatment: Rehabilitation  SUBJECTIVE:   SUBJECTIVE STATEMENT: "I learned to do the hard swallow with the water" Pt accompanied by: self  PERTINENT HISTORY: "Pt is a 78 yo female referred for MBS and esophagram by Dr Jerelene Monday. Pt with PMH + for GERD, breast cancer, DVT, seizures, pulmonary HTN, allergic rhinitis, dyspnea, heart mumur, radiation therapy 05/02/2022-05/30/2022 left breast. Pt describes dysphagia to MD as mucus in the throat with frequent throat clearing. ? h/o facial droop and dysarthria on 07/04/23. Has xerostomia, S/P manometry showing nonspecific esoph dysmotiltiy, imcomplete clearance of allow swallows, normal resting LES, slight increased pressure for relaxation of LES, intact peristalsis 06/19/2023. Esophagram  completed in 2024 showed penetration of liquids but no aspiration. Patient does endorse some coughing with liquid intake. Patient reports significant issues with coughing and states her allergist informed her to swallow when this occurs but she states this is difficult due to her dry mouth. And if she swallows water it may cause her to cough. SLP advised to consider using sugar-free lemon drops if Dr. Jerelene Monday is agreeable. She does endorse some voice changes at times. Reports she takes her PPI twice a day and her famotidine  at night. SLP asked if she ever drools to which patient stated on occasion when chewing gum noting this occurs more on the left side. Does appear with slight facial asymmetry on the left with decreased labial closure when taxed with resistance. Denies having any pneumonia stating she got the pneumonia shot."  PAIN: Are you having pain? No   PATIENT GOAL's: Pt shared hopes to improve her swallow and reduce habitual throat clearing  OBJECTIVE:  Note: Objective measures were completed at Evaluation unless otherwise noted. OBJECTIVE:   INSTRUMENTAL SWALLOW STUDY FINDINGS (MBSS 10/03/23) Clinical Impression: Patient presents with mild pharyngeal dysphagia mostly characterized by inadequate laryngeal elevation and closure resulting in  minimal trace aspiration of thin liquids on an inconsistent basis.  Chin tuck posture not helpful as it allowed barium to spill into open larynx during the swallow.   Using thin liquid to wash down food worsened penetration and aspiration.  Though patient does not sense trace aspiration cued cough is effective to help her to clear.  Pharyngeal swallow is strong fortunately.  Trace retention with liquids at vallecula and piriform sinus likely due to pressure issue from known esophageal deficits.     Patient denies any issues with pulmonary infections.  No aspiration or deep penetration of any other consistencies.  Patient was tested both with thin and ultrathin  (thin barium halfed with water) -and no significant difference with airway protection noted.     Advised patient if consumption of coffee with large amounts of cream or slightly thicker liquids such as orange juice or Ensure, Glucerna are more comfortable for her to swallow -she may wish to drink these during her meals.  Encouraged her to drink water to help with hydration and advised that water is pH neutral thus safest liquid to aspirate.  She endorses taking her medications with applesauce for ease of swallow.  Recommended that if she does that to start intake with liquid and drink liquids after swallowing medications with applesauce.  Thank you so much for this consult.    ESOPHAGRAM IMPRESSION (10/03/23): Mild-to-moderate esophageal dysmotility.                                                                                           TREATMENT DATE:   11/03/23: Jasie completes HEP for dysphagia with mod I. She continues to use hard swallow with a sip to successfully clear phlegm and avoid clearing her throat. Reviewed reflux precautions and LPR. She verbalizes reflux precautions of diet modifications, avoiding mint and caffeine. She drinks decaf tea. No throat clears this session.    11-11-23: Hoarse vocal quality present today. Endorsed voice is worse in morning. ENT visit scheduled late May. Reported completion of swallow HEP, at least 20x/day, but often times more frequently throughout day. Able to teach back and demo swallow exercises x3 with mod I. Did not recall effortful swallows, with re-education provided this date. Pt completed x20 reps with mod I. Reported suspected s/sx of acid reflux. RSI score of 24 (score of >13 indicative of LPR). Provided instruction and handout of reflux management strategies. Pt verbalized understanding and appreciation. Endorsed overall improvements in swallowing, including being more intentional with duration of mastication and taking pills with puree. Only one  throat clear evidenced today but pt aware and able to implement strategy following episode.   10-28-23: Initiated training in throat clear alternatives including hard swallow, sips, sniff-sniff-swallow - and that she may have to do this several times until the sensation passes. We discussed that reflux medications stop acid but they don't eliminate the reflux - information provided on LPR and chronic throat clearing. Initiate HEP for dysphagia - Dorann demonstrated effortful swallow, Masko, Mendelson and tongue press with rare min A after initial instruction and modeling. Reviewed swallow precautions - Amarrah is taking meds with puree - instructed  her to swallow with intent, be mindful when eating with no distractions and to use effortful swallow with meals. Instructed her to be mindful to chew and swallow, then talk especially when eating out as there are more distractions.   10-23-23: Eval and POC completed. Initiated education of MBSS results and negative impact of habitual throat clearing. Planning to address throat clear alternatives next session. Recommended pt visit ENT, specifically laryngologist, to further investigate concerns with vocal quality and reported excess mucus.  PATIENT EDUCATION: Education details: see above Person educated: Patient Education method: Explanation Education comprehension: verbalized understanding and needs further education  ASSESSMENT:  CLINICAL IMPRESSION: Patient is a 78 y.o. M who was seen today for treatment. Continued education and instruction of swallow exercises, throat clear alternatives, and reflux management. Pt able to demo with rare min A. Pt would benefit from skilled ST to address dysphagia and voice to optimize safety during swallowing and vocal hygiene.  OBJECTIVE IMPAIRMENTS: include voice disorder and dysphagia. These impairments are limiting patient from effectively communicating at home and in community and safety when swallowing. Factors  affecting potential to achieve goals and functional outcome are ability to learn/carryover information and cooperation/participation level. Patient will benefit from skilled SLP services to address above impairments and improve overall function.  REHAB POTENTIAL: Good   GOALS: Goals reviewed with patient? Yes  SHORT TERM GOALS: Target date: 11/20/2023    Pt will accurately demonstrate swallowing exercises with min-A across 2/2 sessions Baseline: 11/11/23 Goal status: ONGOING  2.  Pt will report daily carryover of HEP across 2/2 sessions Baseline: 11/11/23 Goal status: ONGOING  3.  Pt will successfully suppress throat clearing across 2/2 sessions with min-A Baseline:  Goal status: ONGOING  4.  Pt will demonstrate understanding of swallow compensations or strategies via teach back with mod-I. Baseline:  Goal status: ONGOING  5.    Pt will verbalize 3 reflux precautions with mod I Baseline:  Goal status: ONGOING   LONG TERM GOALS: Target date: 12/18/2023  Pt will report reduction in frequency of throat clearing > 1 week with use of trained techniques  Baseline:  Goal status: ONGOING  2.  Pt will report improved score of PROM by at least 2 points by LTG date. Baseline: EAT-10=8 Goal status: ONGOING  3.  Pt will demonstrate swallowing exercises with mod-I across 2/2 sessions. Baseline:  Goal status: ONGOING  4.    Pt will be independent with HEP Baseline:  Goal status: ONGOING  PLAN:  SLP FREQUENCY: 2x/week  SLP DURATION: 8 weeks  PLANNED INTERVENTIONS: Aspiration precaution training, Pharyngeal strengthening exercises, Environmental controls, Cueing hierachy, SLP instruction and feedback, Compensatory strategies, Patient/family education, and [address] Treatment of speech (30 or 45 min)     Nohelani Benning, Dareen Ebbing, CCC-SLP 11/13/2023, 10:15 AM

## 2023-11-13 NOTE — Progress Notes (Signed)
 She presents today with a chief concern of a painful forefoot left.  States that she has this callus that has become painful once again.  Objective: Vital signs are stable alert oriented x 3.  Pulses are palpated.  She does have a decrease in sensorium per Charolett Copes monofilament consistent with diabetic peripheral neuropathy multiple benign preulcerative lesions are noted on the plantar forefoot bilateral left greater than right.  She has digital contractures 2 through 5 bilaterally with prominent metatarsal heads.  No open lesions or wounds at this point.  Assessment: Diabetes mellitus.  Diabetic peripheral neuropathy.  Contracted digits preulcerative lesions.  Plan: Discussed etiology pathology conservative versus surgical therapies at this point were tried offloading the lesions to no avail and they are still exquisitely painful and continue to demonstrate preulcerative characteristics.  I will follow-up with her in the near future otherwise diabetic shoes are going to be her only option at this point.

## 2023-11-13 NOTE — Telephone Encounter (Signed)
 I ordered the HST.

## 2023-11-14 NOTE — Telephone Encounter (Signed)
 Pt has returned call to [place], [place]

## 2023-11-14 NOTE — Telephone Encounter (Signed)
 I called pt and LMVM 2nd message that trying to reach her relating her to machine and then HST order.  Call back after 1300.

## 2023-11-15 ENCOUNTER — Emergency Department (HOSPITAL_BASED_OUTPATIENT_CLINIC_OR_DEPARTMENT_OTHER)

## 2023-11-15 ENCOUNTER — Encounter (HOSPITAL_BASED_OUTPATIENT_CLINIC_OR_DEPARTMENT_OTHER): Payer: Self-pay | Admitting: Emergency Medicine

## 2023-11-15 ENCOUNTER — Ambulatory Visit: Payer: Self-pay | Admitting: *Deleted

## 2023-11-15 ENCOUNTER — Emergency Department (HOSPITAL_BASED_OUTPATIENT_CLINIC_OR_DEPARTMENT_OTHER)
Admission: EM | Admit: 2023-11-15 | Discharge: 2023-11-15 | Disposition: A | Discharge status: Home / Self Care | Attending: Emergency Medicine | Admitting: Emergency Medicine

## 2023-11-15 ENCOUNTER — Other Ambulatory Visit: Payer: Self-pay

## 2023-11-15 DIAGNOSIS — Z91040 Latex allergy status: Secondary | ICD-10-CM | POA: Insufficient documentation

## 2023-11-15 DIAGNOSIS — Z79899 Other long term (current) drug therapy: Secondary | ICD-10-CM | POA: Diagnosis not present

## 2023-11-15 DIAGNOSIS — J45909 Unspecified asthma, uncomplicated: Secondary | ICD-10-CM | POA: Insufficient documentation

## 2023-11-15 DIAGNOSIS — X501XXA Overexertion from prolonged static or awkward postures, initial encounter: Secondary | ICD-10-CM | POA: Diagnosis not present

## 2023-11-15 DIAGNOSIS — E119 Type 2 diabetes mellitus without complications: Secondary | ICD-10-CM | POA: Insufficient documentation

## 2023-11-15 DIAGNOSIS — M7989 Other specified soft tissue disorders: Secondary | ICD-10-CM | POA: Diagnosis not present

## 2023-11-15 DIAGNOSIS — I10 Essential (primary) hypertension: Secondary | ICD-10-CM | POA: Insufficient documentation

## 2023-11-15 DIAGNOSIS — S93601A Unspecified sprain of right foot, initial encounter: Secondary | ICD-10-CM | POA: Insufficient documentation

## 2023-11-15 DIAGNOSIS — M79671 Pain in right foot: Secondary | ICD-10-CM | POA: Diagnosis present

## 2023-11-15 DIAGNOSIS — M19071 Primary osteoarthritis, right ankle and foot: Secondary | ICD-10-CM | POA: Diagnosis not present

## 2023-11-15 NOTE — ED Triage Notes (Signed)
 Pt had a near fall yesterday at the beauty parlor.  Was caught by her stylist and did not completely fall but injured her right foot.  Discoloration and swelling noted. Pt is xarelto .

## 2023-11-15 NOTE — Telephone Encounter (Signed)
  Chief Complaint: twisted foot- large bruising  Symptoms: bruising, swelling of R foot- patient did not notice until now Frequency: twisted yesterday at salon Pertinent Negatives: Patient denies pain, limping Disposition: [] ED /[x] Urgent Care (no appt availability in office) / [] Appointment(In office/virtual)/ []  [facility] [facility]/ [] Home Care/ [] Refused Recommended Disposition /[] [facility]/ []  Follow-up with PCP Additional Notes: Patient is on blood thinner and will have foot checked per protocol. Patient had called to relay BP reading for earlier triage- BP 115/69, P 87 - reassuring   Copied from CRM #[id number]. Topic: Clinical - Red Word Triage >> Nov 15, 2023 12:55 PM [place] M wrote: Red Word that prompted transfer to Nurse Triage: Dizzy on and off started today. Not feeling well. No fever. >> Nov 15, 2023  3:17 PM Valeri Gate H wrote: Patient called back asking to speak specifically with the nurse who triaged her earlier for red word, said they were speaking about a blood pressure machine and she wanted to ask her something. Would like a call back. Number is [phone]. It was RN Jerryl Morin F. Donavon Fudge who the patient would like to speak with.  Reason for Disposition  Large swelling or bruise (> 2 inches or 5 cm)  Answer Assessment - Initial Assessment Questions 1. MECHANISM: "How did the injury happen?" (e.g., twisting injury, direct blow)      Tripped at hair salon  2. ONSET: "When did the injury happen?" (Minutes or hours ago)      Yesterday- 11/14/23 3. LOCATION: "Where is the injury located?"      R foot 4. APPEARANCE of INJURY: "What does the injury look like?"      Bruising- R side of foot, top may be swollen, bottom of toes, red line in middle of foot- sore 5. WEIGHT-BEARING: "Can you put weight on that foot?" "Can you walk (four steps or more)?"       yes 6. SIZE: For cuts, bruises, or swelling, ask: "How large is it?" (e.g., inches or centimeters;  entire joint)       Swelling- puffy 7. PAIN: "Is there pain?" If Yes, ask: "How bad is the pain?"    (e.g., Scale 1-10; or mild, moderate, severe)   - NONE (0): no pain.   - MILD (1-3): doesn't interfere with normal activities.    - MODERATE (4-7): interferes with normal activities (e.g., work or school) or awakens from sleep, limping.    - SEVERE (8-10): excruciating pain, unable to do any normal activities, unable to walk.      None- unless pressure applied 8. TETANUS: For any breaks in the skin, ask: "When was the last tetanus booster?"     na 9. OTHER SYMPTOMS: "Do you have any other symptoms?"      Called regarding dizziness today  Protocols used: Ankle and Foot Injury-A-AH

## 2023-11-15 NOTE — Telephone Encounter (Signed)
    Chief Complaint: new onset dizziness Symptoms: woozy feeling- dizziness- changing position Frequency: started today Pertinent Negatives: Patient denies fever, chest pain, vomiting, diarrhea, bleeding Disposition: [] ED /[] Urgent Care (no appt availability in office) / [x] Appointment(In office/virtual)/ []  [facility]/ [] Home Care/ [] Refused Recommended Disposition /[] [facility]/ []  Follow-up with PCP Additional Notes: No open appointment today- patient has been scheduled for Monday- instructed UC if symptoms should increase or she develops additional symptoms    Copied from CRM #[id number]. Topic: Clinical - Red Word Triage >> Nov 15, 2023 12:55 PM [place] M wrote: Red Word that prompted transfer to Nurse Triage: Dizzy on and off started today. Not feeling well. No fever. Reason for Disposition  [1] MILD dizziness (e.g., walking normally) AND [2] has NOT been evaluated by doctor (or NP/PA) for this  (Exception: Dizziness caused by heat exposure, sudden standing, or poor fluid intake.)  Answer Assessment - Initial Assessment Questions 1. DESCRIPTION: "Describe your dizziness."     In head above eye- feels woozy 2. LIGHTHEADED: "Do you feel lightheaded?" (e.g., somewhat faint, woozy, weak upon standing)     woozy 3. VERTIGO: "Do you feel like either you or the room is spinning or tilting?" (i.e. vertigo)     no 4. SEVERITY: "How bad is it?"  "Do you feel like you are going to faint?" "Can you stand and walk?"   - MILD: Feels slightly dizzy, but walking normally.   - MODERATE: Feels unsteady when walking, but not falling; interferes with normal activities (e.g., school, work).   - SEVERE: Unable to walk without falling, or requires assistance to walk without falling; feels like passing out now.      mild 5. ONSET:  "When did the dizziness begin?"     today 6. AGGRAVATING FACTORS: "Does anything make it worse?" (e.g., standing, change in head position)     no 7.  HEART RATE: "Can you tell me your heart rate?" "How many beats in 15 seconds?"  (Note: not all patients can do this)       Unable to check 8. CAUSE: "What do you think is causing the dizziness?"     unsure 9. RECURRENT SYMPTOM: "Have you had dizziness before?" If Yes, ask: "When was the last time?" "What happened that time?"     no 10. OTHER SYMPTOMS: "Do you have any other symptoms?" (e.g., fever, chest pain, vomiting, diarrhea, bleeding)       no  Protocols used: Dizziness - Lightheadedness-A-AH

## 2023-11-15 NOTE — ED Provider Notes (Signed)
 Protection EMERGENCY DEPARTMENT AT [facility] Provider Note   CSN: [id number] Arrival date & time: 11/15/23  1756     History  Chief Complaint  Patient presents with   Foot Pain    [name] is a 78 y.o. female.  With past medical history of asthma, Graves' disease, obstructive sleep apnea on CPAP, DVT/PE on Xarelto , HTN, DM2 reporting with right foot pain after inversion ankle injury yesterday. Now having pain, bruising and mild swelling to right lateral foot and ankle. Did not fall during injury. No head injury or neck injury during fall. Reports right foot swelling and pain. No open laceration or abrasion.    Foot Pain       Home Medications Prior to Admission medications   Medication Sig Start Date End Date Taking? Authorizing Provider  ADVAIR  HFA 230-21 MCG/ACT inhaler Inhale 2 puffs into the lungs 2 (two) times daily. 2 puffs 1-2 times daily depending on disease activity. 09/24/23   Kozlow, Rema Care, MD  albuterol  (PROVENTIL ) (2.5 MG/3ML) 0.083% nebulizer solution Take 3 mLs (2.5 mg total) by nebulization every 6 (six) hours as needed for wheezing or shortness of breath. 09/24/23   Kozlow, Rema Care, MD  albuterol  (VENTOLIN  HFA) 108 (90 Base) MCG/ACT inhaler INHALE 2 PUFFS BY MOUTH EVERY 6 HOURS AS NEEDED FOR WHEEZING OR SHORTNESS OF BREATH 09/24/23   Kozlow, Rema Care, MD  anastrozole  (ARIMIDEX ) 1 MG tablet TAKE 1 TABLET(1 MG) BY MOUTH DAILY 12/12/22   Cameron Cea, MD  atorvastatin  (LIPITOR) 10 MG tablet TAKE 1 TABLET(10 MG) BY MOUTH DAILY 07/08/23   Bedsole, Amy E, MD  benzonatate  (TESSALON ) 200 MG capsule Take 1 capsule (200 mg total) by mouth 2 (two) times daily as needed for cough. Patient not taking: Reported on 11/06/2023 09/03/23   Judithann Novas, MD  Blood Glucose Monitoring Suppl (ONETOUCH VERIO) w/Device KIT Use to check blood sugar up to 2 times a day 08/16/20   Herby Lolling E, MD  budesonide  (PULMICORT ) 0.5 MG/2ML nebulizer solution Take 2 mLs (0.5 mg total) by  nebulization in the morning and at bedtime. 09/23/23   Kozlow, Rema Care, MD  cyclobenzaprine  (FLEXERIL ) 10 MG tablet Take 0.5-1 tablets (5-10 mg total) by mouth at bedtime as needed for muscle spasms. 03/09/22   Bedsole, Amy E, MD  desloratadine  (CLARINEX ) 5 MG tablet TAKE 1 TABLET(5 MG) BY MOUTH DAILY 08/20/23   Kozlow, Rema Care, MD  EPINEPHrine  0.3 mg/0.3 mL IJ SOAJ injection Inject 0.3 mg into the muscle as needed for anaphylaxis. USE AS DIRECTED FOR LIFE THREATENING ALLERGIC REACTIONS 09/24/23   Kozlow, Rema Care, MD  estradiol  (ESTRACE ) 0.1 MG/GM vaginal cream Place 1 Applicatorful vaginally 2 (two) times a week. (As Needed)    [provider]  famotidine  (PEPCID ) 40 MG tablet Take 1 tablet (40 mg total) by mouth at bedtime. Take 1 (ONE) tablet by mouth every evening. 09/24/23   Kozlow, Rema Care, MD  fluconazole  (DIFLUCAN ) 150 MG tablet Take one pill by mouth now and repeat dose in 3 days Patient not taking: Reported on 11/06/2023 06/12/23   Tower, Manley Seeds, MD  fluticasone  (FLOVENT  HFA) 220 MCG/ACT inhaler Inhale 2 puffs into the lungs 2 (two) times daily. 2 puffs 2 times daily during flare up. 09/24/23   Kozlow, Rema Care, MD  gabapentin  (NEURONTIN ) 300 MG capsule Take one capsule in the morning, one at lunch and two at bedtime 09/05/22   Hyatt, Max T, DPM  glucose blood (ONETOUCH VERIO)  test strip 1 strip 2 (two) times daily 01/29/22   [provider]  GLYXAMBI  10-5 MG TABS TAKE 1 TABLET BY MOUTH DAILY 07/22/23   Bedsole, Amy E, MD  hydrOXYzine  (ATARAX ) 10 MG tablet Take 1 tablet (10 mg total) by mouth daily as needed for itching. 05/03/23   Bedsole, Amy E, MD  ipratropium (ATROVENT ) 0.06 % nasal spray Place 2 sprays into both nostrils 2 (two) times daily as needed for rhinitis. 05/28/23   Kozlow, Rema Care, MD  irbesartan  (AVAPRO ) 150 MG tablet TAKE 1 TABLET(150 MG) BY MOUTH DAILY 04/17/23   Croitoru, Mihai, MD  Lactase (DAIRY-RELIEF PO) Take 1 capsule by mouth daily as needed (eating dairy).     [provider]  Lancets (ONETOUCH DELICA PLUS LANCET33G) MISC USE TO CHECK BLOOD SUGAR UP TO TWICE DAILY 01/29/22   Bedsole, Amy E, MD  Magnesium 500 MG TABS Take 500 mg by mouth 2 (two) times daily.    [provider]  Menthol , Topical Analgesic, (ABSORBINE PLUS JR EX) Apply 1 patch topically daily as needed (pain).    [provider]  Menthol -Methyl Salicylate (SALONPAS JET SPRAY EX) Apply 1 spray topically daily as needed (knee pain).    [provider]  methimazole  (TAPAZOLE ) 5 MG tablet Take 1 tablet (5 mg total) by mouth daily. 11/05/23   Emilie Harden, MD  montelukast  (SINGULAIR ) 10 MG tablet Take 1 tablet (10 mg total) by mouth at bedtime. For asthma control. 09/24/23   Kozlow, Rema Care, MD  Mouthwashes (BIOTENE DRY MOUTH MT) Use as directed 1 Dose in the mouth or throat daily as needed (dry mouth).    [provider]  Nebulizer MISC 1 Device by Does not apply route as directed. 02/12/23   Kozlow, Rema Care, MD  ondansetron  (ZOFRAN -ODT) 4 MG disintegrating tablet Take 1 tablet (4 mg total) by mouth every 8 (eight) hours as needed for nausea or vomiting. 02/20/23   Bedsole, Amy E, MD  oxybutynin (DITROPAN-XL) 10 MG 24 hr tablet Take 10 mg by mouth daily. 09/07/23   [provider]  pantoprazole  (PROTONIX ) 40 MG tablet Take 1 tablet (40 mg total) by mouth 2 (two) times daily. 09/24/23   Kozlow, Rema Care, MD  Plecanatide (TRULANCE) 3 MG TABS Take 3 mg by mouth daily as needed (constipation).    [provider]  Polyethyl Glycol-Propyl Glycol (SYSTANE OP) Place 1 drop into both eyes daily as needed (dry eyes).    [provider]  polyethylene glycol powder (GLYCOLAX /MIRALAX ) 17 GM/SCOOP powder Take 1 Container by mouth once.    [provider]  predniSONE  (DELTASONE ) 20 MG tablet Take 1 tablet (20 mg total) by mouth daily with breakfast. Patient not taking: Reported on 11/06/2023 09/21/23   Trudy Fusi, DO  PRESCRIPTION  MEDICATION Inhale into the lungs at bedtime. CPAP    [provider]  Respiratory Therapy Supplies (NEBULIZER MASK ADULT) MISC 1 kit by Does not apply route as directed. 01/15/23   Kozlow, Rema Care, MD  rivaroxaban  (XARELTO ) 20 MG TABS tablet Take 1 tablet (20 mg total) by mouth daily with supper. 04/16/23   Croitoru, Mihai, MD  triamcinolone  (NASACORT ) 55 MCG/ACT AERO nasal inhaler Place 2 sprays into the nose daily. 05/28/23   Kozlow, Rema Care, MD  Vibegron (GEMTESA) 75 MG TABS Take 75 mg by mouth daily.    [provider]      Allergies    Dilantin [phenytoin], Latex, Oxycodone , Penicillins, Codeine , Hydrocodone , Nickel, and  Pregabalin    Review of Systems   Review of Systems  Musculoskeletal:  Positive for arthralgias.    Physical Exam Updated Vital Signs BP (!) 166/59 (BP Location: Right Arm)   Pulse 89   Temp 97.8 F (36.6 C) (Oral)   Resp 16   SpO2 98%  Physical Exam Vitals and nursing note reviewed.  Constitutional:      General: She is not in acute distress.    Appearance: She is not toxic-appearing.  HENT:     Head: Normocephalic and atraumatic.  Eyes:     General: No scleral icterus.    Conjunctiva/sclera: Conjunctivae normal.  Cardiovascular:     Rate and Rhythm: Normal rate and regular rhythm.     Pulses: Normal pulses.     Heart sounds: Normal heart sounds.  Pulmonary:     Effort: Pulmonary effort is normal. No respiratory distress.     Breath sounds: Normal breath sounds.  Abdominal:     General: Abdomen is flat. Bowel sounds are normal.     Palpations: Abdomen is soft.     Tenderness: There is no abdominal tenderness.  Musculoskeletal:     Right lower leg: No edema.     Left lower leg: No edema.     Comments: tenderness to palpation over fifth metatarsal.  Diffuse ecchymosis and mild bruising to right foot.  Neurovascularly intact with strong dorsal pedal pulse bilaterally.  Skin:    General: Skin is warm and dry.     Findings: No lesion.   Neurological:     General: No focal deficit present.     Mental Status: She is alert and oriented to person, place, and time. Mental status is at baseline.     ED Results / Procedures / Treatments   Labs (all labs ordered are listed, but only abnormal results are displayed) Labs Reviewed - No data to display  EKG None  Radiology No results found.  Procedures Procedures    Medications Ordered in ED Medications - No data to display  ED Course/ Medical Decision Making/ A&P                                 Medical Decision Making Amount and/or Complexity of Data Reviewed Radiology: ordered.   This patient presents to the ED for concern of right foot pain, this involves an extensive number of treatment options, and is a complaint that carries with it a high risk of complications and morbidity.  The differential diagnosis includes fracture, sprain, grout, DVT, abrasion     Imaging Studies ordered:  I ordered imaging studies including right foot   Pending.    Problem List / ED Course / Critical interventions / Medication management  Patient reporting to emergency room after injuring ankle.  She had inversion type injury and now is having tenderness over her forefoot and specifically lateral aspect of foot over fifth metatarsal.  Has some mild bruising and swelling otherwise neurovascularly intact no other injuries she hemodynamically stable and very well-appearing she able to ambulate without any assistive devices and with steady gait.  She reports she has pain medicine sent here due to significant ecchymosis.  She has no active bleeding or laceration.  Will obtain x-ray to rule out acute fracture. Anticipate stable for discharge.   I have reviewed the patients home medicines and have made adjustments as needed   Plan  Signed off to [place] at shift  change pending imaging.          Final Clinical Impression(s) / ED Diagnoses Final diagnoses:  Sprain of right  foot, initial encounter    Rx / DC Orders ED Discharge Orders     None         Eudora Heron, PA-C 11/15/23 1830    Flonnie Humphrey, DO 11/16/23 0016

## 2023-11-15 NOTE — ED Provider Notes (Signed)
 Care transferred from previous provider.  See note for full HPI.  In summation 78 year old with trip and fall and inversion injury to foot yesterday after mechanical fall.  Has some pain and bruising to the lateral aspect of her right foot.  No pain to ankle.  Ambulatory PTA.  She was concerned for the bruising due to her Xarelto  use.  Has been elevating the foot however no ice, meds.  Plan follow-up on imaging and disposition anticipate likely DC home   Physical Exam  BP (!) 166/59 (BP Location: Right Arm)   Pulse 89   Temp 97.8 F (36.6 C) (Oral)   Resp 16   SpO2 98%   Physical Exam Vitals and nursing note reviewed.  Constitutional:      General: She is not in acute distress.    Appearance: She is well-developed. She is not ill-appearing.  HENT:     Head: Atraumatic.  Eyes:     Pupils: Pupils are equal, round, and reactive to light.  Cardiovascular:     Rate and Rhythm: Normal rate.     Pulses:          Dorsalis pedis pulses are 2+ on the right side and 2+ on the left side.  Pulmonary:     Effort: No respiratory distress.  Abdominal:     General: There is no distension.  Musculoskeletal:        General: Normal range of motion.     Cervical back: Normal range of motion.     Comments: Tenderness ATFL right foot, nontender tib-fib able to plantarflex and dorsiflex without difficulty  Skin:    General: Skin is warm and dry.     Findings: Bruising present.  Neurological:     General: No focal deficit present.     Mental Status: She is alert.  Psychiatric:        Mood and Affect: Mood normal.     Procedures  .Ortho Injury Treatment  Date/Time: 11/15/2023 7:21 PM  Performed by: Coleta David A, PA-C Authorized by: Dickson Founds, PA-C   Consent:    Consent obtained:  Verbal   Consent given by:  Patient   Risks discussed:  Fracture, nerve damage, restricted joint movement, vascular damage, stiffness, recurrent dislocation and irreducible dislocation    Alternatives discussed:  No treatment, alternative treatment, immobilization, referral and delayed treatmentInjury location: ankle Location details: right ankle Injury type: soft tissue Pre-procedure neurovascular assessment: neurovascularly intact Pre-procedure distal perfusion: normal Pre-procedure neurological function: normal Pre-procedure range of motion: normal  Anesthesia: Local anesthesia used: no  Patient sedated: NoImmobilization: brace Splint type: aso brace. Splint Applied by: ED Tech Post-procedure neurovascular assessment: post-procedure neurovascularly intact Post-procedure distal perfusion: normal Post-procedure neurological function: normal Post-procedure range of motion: normal    DG Foot Complete Right Result Date: 11/15/2023 CLINICAL DATA:  Right foot swelling after fall. EXAM: RIGHT FOOT COMPLETE - 3+ VIEW COMPARISON:  April 10, 2023. FINDINGS: Stable findings consistent with surgical fixation of old distal fifth metatarsal fracture. Fusion of first interphalangeal joint is noted as well as third and fourth interphalangeal joints. No acute fracture or dislocation is noted. IMPRESSION: Postsurgical and degenerative changes as noted above. No acute abnormality seen. Electronically Signed   By: Rosalene Colon M.D.   On: 11/15/2023 18:32    ED Course / MDM     Care transferred from previous provider.  See note for full HPI.  In summation 78 year old with trip and fall and inversion injury to foot yesterday.  Has some pain and bruising to the lateral aspect of her right foot.  No pain to ankle.  Ambulatory PTA.  She was concerned for the bruising due to her Xarelto  use.  Has been elevating the foot however no ice, meds.  Plan follow-up on imaging and disposition anticipate likely DC home  Imaging personally viewed interpreted No acute abnormality seen on x-ray of foot  Patient nontender tib-fib, nontender lateral malleolus.  Does have some bruising to the  lateral aspect of her foot, proximal metatarsals however no evidence of ischemia.  She is neurovascularly intact.  No other plantarflex, dorsiflex and ambulate.  Placed in ASO brace for support.  She already has follow-up appointment on Monday.  Will have her follow-up outpatient, return for new or worsening symptoms   Medical Decision Making Amount and/or Complexity of Data Reviewed External Data Reviewed: labs, radiology and notes. Radiology: ordered and independent interpretation performed. Decision-making details documented in ED Course.  Risk OTC drugs. Prescription drug management. Decision regarding hospitalization. Diagnosis or treatment significantly limited by social determinants of health.          Lonzo Saulter A, PA-C 11/15/23 1922    Flonnie Humphrey, [place] 11/16/23 0017

## 2023-11-15 NOTE — Telephone Encounter (Signed)
 Patient has an appointment with Dr. Geralyn Knee on Monday 11/18/2022 at 2:40 pm.

## 2023-11-15 NOTE — Discharge Instructions (Addendum)
 I would recommend ice, compression and elevation. Take 1000mg  of Tylneol for pain control. Follow up with PCP.

## 2023-11-17 NOTE — Progress Notes (Unsigned)
     Lucill Mauck T. Aviraj Kentner, MD, CAQ Sports Medicine [facility] at [facility] [address] [place], [address]  Phone: [phone]  FAX: [phone]  [name] - 78 y.o. female  MRN [id number]  Date of Birth: [birth year]  Date: 11/18/2023  PCP: Judithann Novas, MD  Referral: Judithann Novas, MD  No chief complaint on file.  Subjective:   [name] is a 78 y.o. very pleasant female patient with There is no height or weight on file to calculate BMI. who presents with the following:  She is a very nice patient who presents with some ongoing dizziness.  History is significant for question of possible seizure disorder, atrial flutter, chronic anticoagulation on Xarelto , type 2 diabetes, history of chronic diastolic heart failure.  She also has a history of left-sided breast cancer, diabetes.  Hypothyroidism.    Review of Systems is noted in the HPI, as appropriate  Objective:   There were no vitals taken for this visit.  GEN: No acute distress; alert,appropriate. PULM: Breathing comfortably in no respiratory distress PSYCH: Normally interactive.   Laboratory and Imaging Data:  Assessment and Plan:   ***

## 2023-11-18 ENCOUNTER — Ambulatory Visit: Attending: Allergy and Immunology

## 2023-11-18 ENCOUNTER — Encounter: Payer: Self-pay | Admitting: Family Medicine

## 2023-11-18 ENCOUNTER — Ambulatory Visit (INDEPENDENT_AMBULATORY_CARE_PROVIDER_SITE_OTHER): Admitting: Family Medicine

## 2023-11-18 VITALS — BP 116/74 | HR 95 | Temp 97.9°F | Ht 64.25 in | Wt 194.0 lb

## 2023-11-18 DIAGNOSIS — S93491A Sprain of other ligament of right ankle, initial encounter: Secondary | ICD-10-CM

## 2023-11-18 DIAGNOSIS — R42 Dizziness and giddiness: Secondary | ICD-10-CM | POA: Diagnosis not present

## 2023-11-18 DIAGNOSIS — N1831 Chronic kidney disease, stage 3a: Secondary | ICD-10-CM

## 2023-11-18 DIAGNOSIS — Z79899 Other long term (current) drug therapy: Secondary | ICD-10-CM

## 2023-11-18 DIAGNOSIS — D638 Anemia in other chronic diseases classified elsewhere: Secondary | ICD-10-CM | POA: Diagnosis not present

## 2023-11-18 DIAGNOSIS — E1140 Type 2 diabetes mellitus with diabetic neuropathy, unspecified: Secondary | ICD-10-CM | POA: Diagnosis not present

## 2023-11-18 DIAGNOSIS — R498 Other voice and resonance disorders: Secondary | ICD-10-CM | POA: Diagnosis not present

## 2023-11-18 DIAGNOSIS — R1313 Dysphagia, pharyngeal phase: Secondary | ICD-10-CM | POA: Diagnosis not present

## 2023-11-18 NOTE — Patient Instructions (Addendum)
 Try to Gatorade Zero or Diet Powerade -   Diabetic protein shake like Diabeta or Glucerna (diabetic protein shake)

## 2023-11-18 NOTE — Patient Instructions (Signed)
 Recommendation from swallow study:  Occasional cough throughout meal as your body did not sense when drink was getting close to your airway

## 2023-11-18 NOTE — Telephone Encounter (Signed)
LMVM for pt that returned call.  °

## 2023-11-18 NOTE — Therapy (Signed)
 OUTPATIENT SPEECH LANGUAGE PATHOLOGY SWALLOW TREATMENT   Patient Name: [name] MRN: [id number] DOB:10-11-1945, 78 y.o., female Today's Date: 11/18/2023  PCP: Judithann Novas MD REFERRING PROVIDER: Fabienne Holter, MD  END OF SESSION:  End of Session - 11/18/23 1233     Visit Number 5    Number of Visits 17    Date for SLP Re-Evaluation 12/18/23    Authorization Type Medicare    SLP Start Time 1233    SLP Stop Time  1315    SLP Time Calculation (min) 42 min    Activity Tolerance Patient tolerated treatment well               Past Medical History:  Diagnosis Date   Allergic rhinitis    Allergy    Arthritis    Asthma    Breast cancer (HCC)    Chronic headache    Diabetes mellitus    Ductal carcinoma in situ (DCIS) of left breast 02/23/2022   DVT (deep venous thrombosis) (HCC)    Dyspnea    Heart murmur    History of radiation therapy    Left Breast 05/02/22-05/30/22- Dr. Retta Caster   Hypertension    Penicillin allergy 01/19/2020   Pulmonary embolism (HCC)    Pulmonary hypertension (HCC)    Seizures (HCC)    Past Surgical History:  Procedure Laterality Date   ABDOMINAL HYSTERECTOMY     partial, has ovaries   BREAST LUMPECTOMY WITH RADIOACTIVE SEED LOCALIZATION Left 03/29/2022   Procedure: LEFT BREAST LUMPECTOMY WITH RADIOACTIVE SEED LOCALIZATION;  Surgeon: Sim Dryer, MD;  Location: [facility] OR;  Service: General;  Laterality: Left;   CARDIAC CATHETERIZATION  12/28/2010   Mod. pulmonary hypertension, normal coronary arteries   CARDIOVERSION N/A 11/23/2020   Procedure: CARDIOVERSION;  Surgeon: Luana Rumple, MD;  Location: [facility];  Service: Cardiovascular;  Laterality: N/A;   CHOLECYSTECTOMY N/A 12/20/2021   Procedure: LAPAROSCOPIC CHOLECYSTECTOMY WITH INTRAOPERATIVE CHOLANGIOGRAM;  Surgeon: Oralee Billow, MD;  Location: [facility];  Service: General;  Laterality: N/A;   DOPPLER ECHOCARDIOGRAPHY  10/08/2011   EF=>55%,mild asymmetric LVH, mod. TR, mod. PH,  mild to mod LA dilatation   ESOPHAGEAL MANOMETRY N/A 06/19/2023   Procedure: ESOPHAGEAL MANOMETRY (EM);  Surgeon: Genell Ken, MD;  Location: [facility];  Service: Gastroenterology;  Laterality: N/A;   IR LUMBAR DISC ASPIRATION W/IMG GUIDE  01/12/2020   KNEE ARTHROSCOPY Left    KNEE SURGERY     Nuclear Stress Test  05/20/2006   No ischemia   PARTIAL HYSTERECTOMY     PLANTAR FASCIA SURGERY     TONSILLECTOMY     Patient Active Problem List   Diagnosis Date Noted   Acute insomnia 09/26/2023   SSS (sick sinus syndrome) (HCC) 04/26/2022   Family history of breast cancer 02/28/2022   Family history of colon cancer in mother 02/28/2022   Ductal carcinoma in situ (DCIS) of left breast 02/23/2022   Family history of malignant neoplasm of digestive organs 02/02/2022   Anemia 12/29/2021   Chronic constipation 12/29/2021   Chronic anticoagulation - Xerelto 12/18/2021   Hyperthyroidism 12/18/2021   Status post total left knee replacement 12/09/2021   Presence of right artificial knee joint 10/30/2021   Constipation by delayed colonic transit 09/25/2021   Atypical atrial flutter (HCC) 06/24/2021   Chronic diastolic heart failure (HCC) 06/24/2021   Type 2 diabetes mellitus with diabetic neuropathy, unspecified (HCC) 06/20/2020   Dysphonia 04/08/2020   Discitis of lumbosacral region 01/04/2020   Hyperlipidemia associated  with type 2 diabetes mellitus (HCC) 05/07/2019   Obstructive sleep apnea treated with continuous positive airway pressure (CPAP) 06/03/2018   Left thyroid  nodule 02/06/2018   Insomnia secondary to chronic pain 01/26/2018   Class 3 drug-induced obesity with serious comorbidity and body mass index (BMI) of 40.0 to 44.9 in adult (HCC) 12/03/2017   Graves disease 10/15/2017   Bilateral high frequency sensorineural hearing loss 10/24/2016   Subjective tinnitus, bilateral 10/24/2016   Moderate persistent asthma 07/12/2015   Paroxysmal atrial fibrillation (HCC) 06/03/2015    Neuropathy due to secondary diabetes (HCC) 01/11/2015   Migraine headache without aura 08/24/2014   Allergic sinusitis 03/25/2014   DVT (deep venous thrombosis) hx of  03/25/2014   Osteoarthritis of right knee 03/25/2014    ? of Seizure disorder 03/25/2014   Depression, major, in remission (HCC) 03/25/2014   Hypertension associated with diabetes (HCC) 03/25/2014   GERD (gastroesophageal reflux disease) 03/25/2014   History of DVT (deep vein thrombosis) 03/25/2014   History of pulmonary embolism 10/09/2013   Asthma, moderate persistent 01/24/2011   Pulmonary hypertension (HCC) 01/11/2011    ONSET DATE: 10/14/2023 (referral date)   REFERRING DIAG: R13.10 (ICD-10-CM) - Swallowing dysfunction  THERAPY DIAG: Dysphagia, pharyngeal phase  Other voice and resonance disorders  Rationale for Evaluation and Treatment: Rehabilitation  SUBJECTIVE:   SUBJECTIVE STATEMENT: "My foot is bothering me some"  Pt accompanied by: self  PERTINENT HISTORY: "Pt is a 77 yo female referred for MBS and esophagram by Dr Jerelene Monday. Pt with PMH + for GERD, breast cancer, DVT, seizures, pulmonary HTN, allergic rhinitis, dyspnea, heart mumur, radiation therapy 05/02/2022-05/30/2022 left breast. Pt describes dysphagia to MD as mucus in the throat with frequent throat clearing. ? h/o facial droop and dysarthria on 07/04/23. Has xerostomia, S/P manometry showing nonspecific esoph dysmotiltiy, imcomplete clearance of allow swallows, normal resting LES, slight increased pressure for relaxation of LES, intact peristalsis 06/19/2023. Esophagram completed in 2024 showed penetration of liquids but no aspiration. Patient does endorse some coughing with liquid intake. Patient reports significant issues with coughing and states her allergist informed her to swallow when this occurs but she states this is difficult due to her dry mouth. And if she swallows water it may cause her to cough. SLP advised to consider using sugar-free lemon  drops if Dr. Jerelene Monday is agreeable. She does endorse some voice changes at times. Reports she takes her PPI twice a day and her famotidine  at night. SLP asked if she ever drools to which patient stated on occasion when chewing gum noting this occurs more on the left side. Does appear with slight facial asymmetry on the left with decreased labial closure when taxed with resistance. Denies having any pneumonia stating she got the pneumonia shot."  PAIN: Are you having pain? No   PATIENT GOAL's: Pt shared hopes to improve her swallow and reduce habitual throat clearing  OBJECTIVE:  Note: Objective measures were completed at Evaluation unless otherwise noted. OBJECTIVE:   INSTRUMENTAL SWALLOW STUDY FINDINGS (MBSS 10/03/23) Clinical Impression: Patient presents with mild pharyngeal dysphagia mostly characterized by inadequate laryngeal elevation and closure resulting in minimal trace aspiration of thin liquids on an inconsistent basis.  Chin tuck posture not helpful as it allowed barium to spill into open larynx during the swallow.   Using thin liquid to wash down food worsened penetration and aspiration.  Though patient does not sense trace aspiration cued cough is effective to help her to clear.  Pharyngeal swallow is strong fortunately.  Trace retention  with liquids at vallecula and piriform sinus likely due to pressure issue from known esophageal deficits.     Patient denies any issues with pulmonary infections.  No aspiration or deep penetration of any other consistencies.  Patient was tested both with thin and ultrathin (thin barium halfed with water) -and no significant difference with airway protection noted.     Advised patient if consumption of coffee with large amounts of cream or slightly thicker liquids such as orange juice or Ensure, Glucerna are more comfortable for her to swallow -she may wish to drink these during her meals.  Encouraged her to drink water to help with hydration and advised  that water is pH neutral thus safest liquid to aspirate.  She endorses taking her medications with applesauce for ease of swallow.  Recommended that if she does that to start intake with liquid and drink liquids after swallowing medications with applesauce.  Thank you so much for this consult.    ESOPHAGRAM IMPRESSION (10/03/23): Mild-to-moderate esophageal dysmotility.                                                                                           TREATMENT DATE:  11/18/23: Endorsed completion of HEP. Re-educated swallow strategy recommended during MBSS to intermittently cough during meal d/t reduced sensation. Provided video feedback and handout with recommendation to aid pt understanding and carryover; pt verbalized understanding. Targeted x100 effortful swallows and x40 Mendelsohn maneuvers today, in which pt able to complete with mod I. Rare throat clear x1 evidenced today; pt endorsed increased mindfulness with utilizing throat clear alternatives at home. Scheduled 1 ST f/u s/p ENT evaluation.   4/30/25Holland Price completes HEP for dysphagia with mod I. She continues to use hard swallow with a sip to successfully clear phlegm and avoid clearing her throat. Reviewed reflux precautions and LPR. She verbalizes reflux precautions of diet modifications, avoiding mint and caffeine. She drinks decaf tea. No throat clears this session.   11-11-23: Hoarse vocal quality present today. Endorsed voice is worse in morning. ENT visit scheduled late May. Reported completion of swallow HEP, at least 20x/day, but often times more frequently throughout day. Able to teach back and demo swallow exercises x3 with mod I. Did not recall effortful swallows, with re-education provided this date. Pt completed x20 reps with mod I. Reported suspected s/sx of acid reflux. RSI score of 24 (score of >13 indicative of LPR). Provided instruction and handout of reflux management strategies. Pt verbalized understanding and  appreciation. Endorsed overall improvements in swallowing, including being more intentional with duration of mastication and taking pills with puree. Only one throat clear evidenced today but pt aware and able to implement strategy following episode.   10-28-23: Initiated training in throat clear alternatives including hard swallow, sips, sniff-sniff-swallow - and that she may have to do this several times until the sensation passes. We discussed that reflux medications stop acid but they don't eliminate the reflux - information provided on LPR and chronic throat clearing. Initiate HEP for dysphagia - [name] demonstrated effortful swallow, Masko, Mendelson and tongue press with rare min A after initial instruction and modeling. Reviewed swallow  precautions - Emerie is taking meds with puree - instructed her to swallow with intent, be mindful when eating with no distractions and to use effortful swallow with meals. Instructed her to be mindful to chew and swallow, then talk especially when eating out as there are more distractions.   10-23-23: Eval and POC completed. Initiated education of MBSS results and negative impact of habitual throat clearing. Planning to address throat clear alternatives next session. Recommended pt visit ENT, specifically laryngologist, to further investigate concerns with vocal quality and reported excess mucus.  PATIENT EDUCATION: Education details: see above Person educated: Patient Education method: Explanation Education comprehension: verbalized understanding and needs further education  ASSESSMENT:  CLINICAL IMPRESSION: Patient is a 78 y.o. M who was seen today for treatment. Continued education and instruction of swallow exercises, throat clear alternatives, and reflux management. Pt able to demo with rare min A. Pt would benefit from skilled ST to address dysphagia and voice to optimize safety during swallowing and vocal hygiene.  OBJECTIVE IMPAIRMENTS: include voice  disorder and dysphagia. These impairments are limiting patient from effectively communicating at home and in community and safety when swallowing. Factors affecting potential to achieve goals and functional outcome are ability to learn/carryover information and cooperation/participation level. Patient will benefit from skilled SLP services to address above impairments and improve overall function.  REHAB POTENTIAL: Good   GOALS: Goals reviewed with patient? Yes  SHORT TERM GOALS: Target date: 11/20/2023    Pt will accurately demonstrate swallowing exercises with min-A across 2/2 sessions Baseline: 11/11/23, 11/18/23 Goal status: MET  2.  Pt will report daily carryover of HEP across 2/2 sessions Baseline: 11/11/23, 11/18/23 Goal status: MET  3.  Pt will successfully suppress throat clearing across 2/2 sessions with min-A Baseline: 11/18/23 Goal status: ONGOING  4.  Pt will demonstrate understanding of swallow compensations or strategies via teach back with mod-I. Baseline:  Goal status: MET  5.    Pt will verbalize 3 reflux precautions with mod I Baseline:  Goal status: MET   LONG TERM GOALS: Target date: 12/18/2023  Pt will report reduction in frequency of throat clearing > 1 week with use of trained techniques  Baseline:  Goal status: ONGOING  2.  Pt will report improved score of PROM by at least 2 points by LTG date. Baseline: EAT-10=8 Goal status: ONGOING  3.  Pt will demonstrate swallowing exercises with mod-I across 2/2 sessions. Baseline:  Goal status: ONGOING  4.    Pt will be independent with HEP Baseline:  Goal status: ONGOING  PLAN:  SLP FREQUENCY: 2x/week  SLP DURATION: 8 weeks  PLANNED INTERVENTIONS: Aspiration precaution training, Pharyngeal strengthening exercises, Environmental controls, Cueing hierachy, SLP instruction and feedback, Compensatory strategies, Patient/family education, and [address] Treatment of speech (30 or 45 min)     Tamar Fairly,  CCC-SLP 11/18/2023, 2:50 PM

## 2023-11-19 ENCOUNTER — Ambulatory Visit: Payer: Self-pay

## 2023-11-19 ENCOUNTER — Encounter: Payer: Self-pay | Admitting: Family Medicine

## 2023-11-19 ENCOUNTER — Ambulatory Visit (INDEPENDENT_AMBULATORY_CARE_PROVIDER_SITE_OTHER)

## 2023-11-19 DIAGNOSIS — J4550 Severe persistent asthma, uncomplicated: Secondary | ICD-10-CM

## 2023-11-19 LAB — HEPATIC FUNCTION PANEL
ALT: 10 U/L (ref 0–35)
AST: 17 U/L (ref 0–37)
Albumin: 3.9 g/dL (ref 3.5–5.2)
Alkaline Phosphatase: 89 U/L (ref 39–117)
Bilirubin, Direct: 0.2 mg/dL (ref 0.0–0.3)
Total Bilirubin: 0.8 mg/dL (ref 0.2–1.2)
Total Protein: 6.2 g/dL (ref 6.0–8.3)

## 2023-11-19 LAB — CBC WITH DIFFERENTIAL/PLATELET
Basophils Absolute: 0.1 10*3/uL (ref 0.0–0.1)
Basophils Relative: 1.2 % (ref 0.0–3.0)
Eosinophils Absolute: 0.1 10*3/uL (ref 0.0–0.7)
Eosinophils Relative: 1.4 % (ref 0.0–5.0)
HCT: 39.4 % (ref 36.0–46.0)
Hemoglobin: 12.6 g/dL (ref 12.0–15.0)
Lymphocytes Relative: 26.2 % (ref 12.0–46.0)
Lymphs Abs: 1.9 10*3/uL (ref 0.7–4.0)
MCHC: 32.1 g/dL (ref 30.0–36.0)
MCV: 72.7 fl — ABNORMAL LOW (ref 78.0–100.0)
Monocytes Absolute: 0.7 10*3/uL (ref 0.1–1.0)
Monocytes Relative: 9.6 % (ref 3.0–12.0)
Neutro Abs: 4.5 10*3/uL (ref 1.4–7.7)
Neutrophils Relative %: 61.6 % (ref 43.0–77.0)
Platelets: 305 10*3/uL (ref 150.0–400.0)
RBC: 5.41 Mil/uL — ABNORMAL HIGH (ref 3.87–5.11)
RDW: 19.1 % — ABNORMAL HIGH (ref 11.5–15.5)
WBC: 7.4 10*3/uL (ref 4.0–10.5)

## 2023-11-19 LAB — IBC + FERRITIN
Ferritin: 9.6 ng/mL — ABNORMAL LOW (ref 10.0–291.0)
Iron: 41 ug/dL — ABNORMAL LOW (ref 42–145)
Saturation Ratios: 7.8 % — ABNORMAL LOW (ref 20.0–50.0)
TIBC: 527.8 ug/dL — ABNORMAL HIGH (ref 250.0–450.0)
Transferrin: 377 mg/dL — ABNORMAL HIGH (ref 212.0–360.0)

## 2023-11-19 LAB — BASIC METABOLIC PANEL WITH GFR
BUN: 18 mg/dL (ref 6–23)
CO2: 28 meq/L (ref 19–32)
Calcium: 9.1 mg/dL (ref 8.4–10.5)
Chloride: 106 meq/L (ref 96–112)
Creatinine, Ser: 1.11 mg/dL (ref 0.40–1.20)
GFR: 48.01 mL/min — ABNORMAL LOW (ref >60.00)
Glucose, Bld: 67 mg/dL — ABNORMAL LOW (ref 70–99)
Potassium: 4.2 meq/L (ref 3.5–5.1)
Sodium: 141 meq/L (ref 135–145)

## 2023-11-19 LAB — HEMOGLOBIN A1C: Hgb A1c MFr Bld: 5.8 % (ref 4.6–6.5)

## 2023-11-19 NOTE — Addendum Note (Signed)
 Addended by: Curry Dove on: 11/19/2023 01:29 PM   Modules accepted: Orders

## 2023-11-19 NOTE — Telephone Encounter (Signed)
 Pt returned phone call, would like a call back to [phone]

## 2023-11-19 NOTE — Telephone Encounter (Signed)
 I am okay if we go ahead and order a machine while waiting for home sleep test.

## 2023-11-19 NOTE — Addendum Note (Signed)
 Addended by: [name] on: 11/19/2023 11:47 AM   Modules accepted: Orders

## 2023-11-19 NOTE — Telephone Encounter (Signed)
 Ms. [name] notified as instructed by telephone.  Patient states understanding.

## 2023-11-19 NOTE — Telephone Encounter (Addendum)
 Copied from CRM [id number]. Topic: Clinical - Red Word Triage >> Nov 19, 2023 12:15 PM Albertha Alosa wrote: [facility] that prompted transfer to Nurse Triage: Patient called in stating she is feeling dizzy    Chief Complaint: Continued dizziness, seen yesterday. Asking about lab work. well.  Symptoms: Above Frequency: yesterday Pertinent Negatives: Patient denies  Disposition: [] ED /[] Urgent Care (no appt availability in office) / [] Appointment(In office/virtual)/ []  [place] [facility]/ [] Home Care/ [x] Refused Recommended Disposition /[] [place] [facility]/ [x]  Follow-up with PCP Additional Notes: Please advise pt.  Reason for Disposition  [1] MODERATE dizziness (e.g., interferes with normal activities) AND [2] has been evaluated by doctor (or NP/PA) for this  Answer Assessment - Initial Assessment Questions 1. DESCRIPTION: "Describe your dizziness."     dizzy 2. LIGHTHEADED: "Do you feel lightheaded?" (e.g., somewhat faint, woozy, weak upon standing)     yes 3. VERTIGO: "Do you feel like either you or the room is spinning or tilting?" (i.e. vertigo)     no 4. SEVERITY: "How bad is it?"  "Do you feel like you are going to faint?" "Can you stand and walk?"   - MILD: Feels slightly dizzy, but walking normally.   - MODERATE: Feels unsteady when walking, but not falling; interferes with normal activities (e.g., school, work).   - SEVERE: Unable to walk without falling, or requires assistance to walk without falling; feels like passing out now.      moderate 5. ONSET:  "When did the dizziness begin?"     yesterday 6. AGGRAVATING FACTORS: "Does anything make it worse?" (e.g., standing, change in head position)     sitting 7. HEART RATE: "Can you tell me your heart rate?" "How many beats in 15 seconds?"  (Note: not all patients can do this)       no 8. CAUSE: "What do you think is causing the dizziness?"     unsure 9. RECURRENT SYMPTOM: "Have you had dizziness before?" If Yes, ask: "When  was the last time?" "What happened that time?"     yes 10. OTHER SYMPTOMS: "Do you have any other symptoms?" (e.g., fever, chest pain, vomiting, diarrhea, bleeding)       no 11. PREGNANCY: "Is there any chance you are pregnant?" "When was your last menstrual period?"       no  Protocols used: Dizziness - Lightheadedness-A-AH

## 2023-11-19 NOTE — Telephone Encounter (Signed)
 From our discussion yesterday, I felt like she was having some transient drop in blood pressure.  I would suggest trying some liquids with electrolytes in it, such as Gatorade 0 or Powerade 0.  Her labs have returned, and they are reassuring.  There are no major changes.  Her anemia has actually improved, but she does have significant iron deficiency.  I would start iron sulfate 325 mg a day.

## 2023-11-19 NOTE — Telephone Encounter (Signed)
 I spoke to pt.  She has not used a machine (which is not working) for several months.  She has HST ordered. I did explain that due to needing a new machine that HST order is to make sure she is still needing machine or if things have changed at all for her.  She verbalized understanding. I did speak to sleep lab and they have her down to call, I relayed to use her home then mobile # , may LVM.  She appreciated call back.

## 2023-11-20 ENCOUNTER — Ambulatory Visit: Admitting: Speech Pathology

## 2023-11-21 ENCOUNTER — Telehealth: Payer: Self-pay | Admitting: *Deleted

## 2023-11-21 NOTE — Telephone Encounter (Signed)
 I think 2 glasses a day is enough

## 2023-11-21 NOTE — Telephone Encounter (Signed)
 Copied from CRM [id number]. Topic: General - Other >> Nov 20, 2023  4:26 PM Aisha D wrote: Reason for CRM: Patient is calling in regards to a missed call from [facility] yesterday. I informed the patient that Abe Abed was assisting another patient and will call back once she is available. Call back number is [phone].

## 2023-11-21 NOTE — Telephone Encounter (Signed)
 Spoke with [name].  She is asking about much Gatorade Zero/Powerade Zero is she suppose to drink daily.  Please advise.

## 2023-11-21 NOTE — Telephone Encounter (Signed)
 Ms. [name] notified as instructed by telephone.  Patient states understanding.

## 2023-11-22 ENCOUNTER — Ambulatory Visit (INDEPENDENT_AMBULATORY_CARE_PROVIDER_SITE_OTHER): Admitting: Family Medicine

## 2023-11-22 ENCOUNTER — Inpatient Hospital Stay: Admission: RE | Admit: 2023-11-22 | Source: Ambulatory Visit

## 2023-11-22 VITALS — BP 118/68 | HR 78 | Temp 97.9°F | Ht 63.5 in | Wt 190.0 lb

## 2023-11-22 DIAGNOSIS — R519 Headache, unspecified: Secondary | ICD-10-CM | POA: Diagnosis not present

## 2023-11-22 DIAGNOSIS — R42 Dizziness and giddiness: Secondary | ICD-10-CM

## 2023-11-22 LAB — C-REACTIVE PROTEIN: CRP: <1 mg/dL (ref 0.5–20.0)

## 2023-11-22 LAB — SEDIMENTATION RATE: Sed Rate: 10 mm/h (ref 0–30)

## 2023-11-22 MED ORDER — ALPRAZOLAM 0.5 MG PO TABS: ORAL_TABLET | ORAL | 0 refills | Status: DC

## 2023-11-22 NOTE — Patient Instructions (Signed)
 Labs today for headache  Stay hydrated   I want to arrange for a CT of the head  Take the alprazolam before the test and have someone drive you

## 2023-11-22 NOTE — Progress Notes (Unsigned)
 Subjective:    Patient ID: [name], female    DOB: Apr 26, 1946, 78 y.o.   MRN: [id number]  HPI  Wt Readings from Last 3 Encounters:  11/22/23 190 lb (86.2 kg)  11/18/23 194 lb (88 kg)  11/05/23 189 lb (85.7 kg)   33.13 kg/m  Vitals:   11/22/23 1115  BP: 118/68  Pulse: 78  Temp: 97.9 F (36.6 C)  SpO2: 100%    78 yo pt of Dr Cherlyn Cornet presents with headache   Saw Dr Geralyn Knee on 5/5 for dizziness  History of seizure disorder , a ftlutter on xarelto  , CM2 and DD  Past breast cancer and hypothyroid   Had recent ankle injury  Dizziness was found to be of unclear origin / ? Orthostasis  Labs ordered   Results for orders placed or performed in visit on 11/22/23  Sedimentation Rate   Collection Time: 11/22/23 11:48 AM  Result Value Ref Range   Sed Rate 10 0 - 30 mm/hr  C-reactive protein   Collection Time: 11/22/23 11:48 AM  Result Value Ref Range   CRP <1.0 0.5 - 20.0 mg/dL   *Note: Due to a large number of results and/or encounters for the requested time period, some results have not been displayed. A complete set of results can be found in Results Review.    Low glucose Low iron  Encouraged more fluids with electrolytes Also iron sulfate 325 mg daily   Intermittent for about a week since seeing Dr Geralyn Knee  Got worse at 9 am  Had not checked blood pressure  Blowing nose more lately    Dizziness continues  Now more headache   In temple area /band across there front  Dull in nature  With the dizziness (light headed/not spinning)  Occational shooting/sharper pain to the eye   Taking gabapentin  (Dr Lara Plants) - that dose was recently decreased  Higher dose caused sedation    Pt does have past history of migraine w/o aura  Took imitrex  in past    DM in good control A1c under 6    HTN bp is stable today  No cp or palpitations or headaches or edema  No side effects to medicines  BP Readings from Last 3 Encounters:  11/22/23 118/68  11/18/23  116/74  11/15/23 130/69    Ibesartan 150 mg daily   A lot of family illness and stress recently and loss     Patient Active Problem List   Diagnosis Date Noted   Headache 11/22/2023   Acute insomnia 09/26/2023   SSS (sick sinus syndrome) (HCC) 04/26/2022   Family history of breast cancer 02/28/2022   Family history of colon cancer in mother 02/28/2022   Ductal carcinoma in situ (DCIS) of left breast 02/23/2022   Family history of malignant neoplasm of digestive organs 02/02/2022   Anemia 12/29/2021   Chronic constipation 12/29/2021   Chronic anticoagulation - Xerelto 12/18/2021   Hyperthyroidism 12/18/2021   Status post total left knee replacement 12/09/2021   Presence of right artificial knee joint 10/30/2021   Constipation by delayed colonic transit 09/25/2021   Atypical atrial flutter (HCC) 06/24/2021   Chronic diastolic heart failure (HCC) 06/24/2021   Type 2 diabetes mellitus with diabetic neuropathy, unspecified (HCC) 06/20/2020   Dysphonia 04/08/2020   Discitis of lumbosacral region 01/04/2020   Hyperlipidemia associated with type 2 diabetes mellitus (HCC) 05/07/2019   Dizziness 05/07/2019   Obstructive sleep apnea treated with continuous positive airway pressure (CPAP) 06/03/2018   Left  thyroid  nodule 02/06/2018   Insomnia secondary to chronic pain 01/26/2018   Class 3 drug-induced obesity with serious comorbidity and body mass index (BMI) of 40.0 to 44.9 in adult (HCC) 12/03/2017   Graves disease 10/15/2017   Bilateral high frequency sensorineural hearing loss 10/24/2016   Subjective tinnitus, bilateral 10/24/2016   Moderate persistent asthma 07/12/2015   Paroxysmal atrial fibrillation (HCC) 06/03/2015   Neuropathy due to secondary diabetes (HCC) 01/11/2015   Migraine headache without aura 08/24/2014   Allergic sinusitis 03/25/2014   DVT (deep venous thrombosis) hx of  03/25/2014   Osteoarthritis of right knee 03/25/2014    ? of Seizure disorder 03/25/2014    Depression, major, in remission (HCC) 03/25/2014   Hypertension associated with diabetes (HCC) 03/25/2014   GERD (gastroesophageal reflux disease) 03/25/2014   History of DVT (deep vein thrombosis) 03/25/2014   History of pulmonary embolism 10/09/2013   Asthma, moderate persistent 01/24/2011   Pulmonary hypertension (HCC) 01/11/2011   Past Medical History:  Diagnosis Date   Allergic rhinitis    Allergy    Arthritis    Asthma    Breast cancer (HCC)    Chronic headache    Diabetes mellitus    Ductal carcinoma in situ (DCIS) of left breast 02/23/2022   DVT (deep venous thrombosis) (HCC)    Dyspnea    Heart murmur    History of radiation therapy    Left Breast 05/02/22-05/30/22- Dr. Retta Caster   Hypertension    Penicillin allergy 01/19/2020   Pulmonary embolism (HCC)    Pulmonary hypertension (HCC)    Seizures (HCC)    Past Surgical History:  Procedure Laterality Date   ABDOMINAL HYSTERECTOMY     partial, has ovaries   BREAST LUMPECTOMY WITH RADIOACTIVE SEED LOCALIZATION Left 03/29/2022   Procedure: LEFT BREAST LUMPECTOMY WITH RADIOACTIVE SEED LOCALIZATION;  Surgeon: Sim Dryer, MD;  Location: [facility] OR;  Service: General;  Laterality: Left;   CARDIAC CATHETERIZATION  12/28/2010   Mod. pulmonary hypertension, normal coronary arteries   CARDIOVERSION N/A 11/23/2020   Procedure: CARDIOVERSION;  Surgeon: Luana Rumple, MD;  Location: [facility];  Service: Cardiovascular;  Laterality: N/A;   CHOLECYSTECTOMY N/A 12/20/2021   Procedure: LAPAROSCOPIC CHOLECYSTECTOMY WITH INTRAOPERATIVE CHOLANGIOGRAM;  Surgeon: Oralee Billow, MD;  Location: [facility];  Service: General;  Laterality: N/A;   DOPPLER ECHOCARDIOGRAPHY  10/08/2011   EF=>55%,mild asymmetric LVH, mod. TR, mod. PH, mild to mod LA dilatation   ESOPHAGEAL MANOMETRY N/A 06/19/2023   Procedure: ESOPHAGEAL MANOMETRY (EM);  Surgeon: Genell Ken, MD;  Location: [facility];  Service: Gastroenterology;  Laterality: N/A;   IR LUMBAR  DISC ASPIRATION W/IMG GUIDE  01/12/2020   KNEE ARTHROSCOPY Left    KNEE SURGERY     Nuclear Stress Test  05/20/2006   No ischemia   PARTIAL HYSTERECTOMY     PLANTAR FASCIA SURGERY     TONSILLECTOMY     Social History   Tobacco Use   Smoking status: Never    Passive exposure: Never   Smokeless tobacco: Never  Vaping Use   Vaping status: Never Used  Substance Use Topics   Alcohol  use: Not Currently    Alcohol /week: 0.0 standard drinks of alcohol     Comment: occ glass on wine   Drug use: No   Family History  Problem Relation Age of Onset   Cancer Mother        breast and colon cancer   Hypertension Mother    Clotting disorder Mother    Breast cancer Mother  67   Arthritis Mother    Stroke Mother    Diabetes Mother    Colon cancer Mother 84 - 55   Alcohol  abuse Father    Cancer Sister        breast cancer   Multiple sclerosis Sister    Cancer Sister        colon cancer   Cancer Brother        colon cancer   Prostate cancer Half-Brother    Stomach cancer Half-Brother    Breast cancer Half-Sister 32       recurrence at 29, reports positive genetic testing   Arthritis Half-Sister    Diabetes Half-Sister    Colon cancer Half-Sister 67   Allergies Other        grandson   Allergic rhinitis Neg Hx    Angioedema Neg Hx    Asthma Neg Hx    Atopy Neg Hx    Eczema Neg Hx    Immunodeficiency Neg Hx    Urticaria Neg Hx    Allergies  Allergen Reactions   Dilantin [Phenytoin] Swelling    facial swelling   Latex Hives   Oxycodone  Nausea And Vomiting and Nausea Only    Abdominal Pain, Vomiting   Penicillins Nausea And Vomiting and Swelling    Has patient had a PCN reaction causing immediate rash, facial/tongue/throat swelling, SOB or lightheadedness with hypotension patient had a PCN reaction causing severe rash involving mucus membranes or skin necrosis: [id number] Has patient had a PCN reaction that required hospitalization/No Has patient had a PCN reaction occurring  within the last 10 years: No If all of the above answers are "NO", then may proceed with Cephalosporin use.      Codeine  Nausea Only    Tolerates tylenol  with codeine    Hydrocodone  Nausea And Vomiting   Nickel Hives and Rash   Pregabalin Itching and Other (See Comments)    headaches/problem w/vision   Current Outpatient Medications on File Prior to Visit  Medication Sig Dispense Refill   ADVAIR  HFA 230-21 MCG/ACT inhaler Inhale 2 puffs into the lungs 2 (two) times daily. 2 puffs 1-2 times daily depending on disease activity. 36 g 5   albuterol  (PROVENTIL ) (2.5 MG/3ML) 0.083% nebulizer solution Take 3 mLs (2.5 mg total) by nebulization every 6 (six) hours as needed for wheezing or shortness of breath. 150 mL 1   albuterol  (VENTOLIN  HFA) 108 (90 Base) MCG/ACT inhaler INHALE 2 PUFFS BY MOUTH EVERY 6 HOURS AS NEEDED FOR WHEEZING OR SHORTNESS OF BREATH 18 g 5   anastrozole  (ARIMIDEX ) 1 MG tablet TAKE 1 TABLET(1 MG) BY MOUTH DAILY 90 tablet 3   atorvastatin  (LIPITOR) 10 MG tablet TAKE 1 TABLET(10 MG) BY MOUTH DAILY 90 tablet 3   Blood Glucose Monitoring Suppl (ONETOUCH VERIO) w/Device KIT Use to check blood sugar up to 2 times a day 1 kit 0   budesonide  (PULMICORT ) 0.5 MG/2ML nebulizer solution Take 2 mLs (0.5 mg total) by nebulization in the morning and at bedtime. 120 mL 0   cyclobenzaprine  (FLEXERIL ) 10 MG tablet Take 0.5-1 tablets (5-10 mg total) by mouth at bedtime as needed for muscle spasms. 20 tablet 0   desloratadine  (CLARINEX ) 5 MG tablet TAKE 1 TABLET(5 MG) BY MOUTH DAILY 90 tablet 1   EPINEPHrine  0.3 mg/0.3 mL IJ SOAJ injection Inject 0.3 mg into the muscle as needed for anaphylaxis. USE AS DIRECTED FOR LIFE THREATENING ALLERGIC REACTIONS 2 each 1   estradiol  (ESTRACE ) 0.1 MG/GM vaginal cream Place  1 Applicatorful vaginally 2 (two) times a week. (As Needed)     famotidine  (PEPCID ) 40 MG tablet Take 1 tablet (40 mg total) by mouth at bedtime. Take 1 (ONE) tablet by mouth every evening.  90 tablet 1   fluconazole  (DIFLUCAN ) 150 MG tablet Take one pill by mouth now and repeat dose in 3 days 2 tablet 0   fluticasone  (FLOVENT  HFA) 220 MCG/ACT inhaler Inhale 2 puffs into the lungs 2 (two) times daily. 2 puffs 2 times daily during flare up. 36 g 1   gabapentin  (NEURONTIN ) 300 MG capsule Take one capsule in the morning, one at lunch and two at bedtime 360 capsule 3   glucose blood (ONETOUCH VERIO) test strip 1 strip 2 (two) times daily     GLYXAMBI  10-5 MG TABS TAKE 1 TABLET BY MOUTH DAILY 30 tablet 5   hydrOXYzine  (ATARAX ) 10 MG tablet Take 1 tablet (10 mg total) by mouth daily as needed for itching. 30 tablet 2   ipratropium (ATROVENT ) 0.06 % nasal spray Place 2 sprays into both nostrils 2 (two) times daily as needed for rhinitis. 45 mL 1   irbesartan  (AVAPRO ) 150 MG tablet TAKE 1 TABLET(150 MG) BY MOUTH DAILY 90 tablet 2   Lactase (DAIRY-RELIEF PO) Take 1 capsule by mouth daily as needed (eating dairy).     Lancets (ONETOUCH DELICA PLUS LANCET33G) MISC USE TO CHECK BLOOD SUGAR UP TO TWICE DAILY 100 each 5   Magnesium 500 MG TABS Take 500 mg by mouth 2 (two) times daily.     Menthol , Topical Analgesic, (ABSORBINE PLUS JR EX) Apply 1 patch topically daily as needed (pain).     Menthol -Methyl Salicylate (SALONPAS JET SPRAY EX) Apply 1 spray topically daily as needed (knee pain).     methimazole  (TAPAZOLE ) 5 MG tablet Take 1 tablet (5 mg total) by mouth daily. 90 tablet 1   montelukast  (SINGULAIR ) 10 MG tablet Take 1 tablet (10 mg total) by mouth at bedtime. For asthma control. 90 tablet 1   Mouthwashes (BIOTENE DRY MOUTH MT) Use as directed 1 Dose in the mouth or throat daily as needed (dry mouth).     Nebulizer MISC 1 Device by Does not apply route as directed. 1 each 1   ondansetron  (ZOFRAN -ODT) 4 MG disintegrating tablet Take 1 tablet (4 mg total) by mouth every 8 (eight) hours as needed for nausea or vomiting. 20 tablet 0   oxybutynin (DITROPAN-XL) 10 MG 24 hr tablet Take 10 mg by  mouth daily.     pantoprazole  (PROTONIX ) 40 MG tablet Take 1 tablet (40 mg total) by mouth 2 (two) times daily. 180 tablet 1   Plecanatide (TRULANCE) 3 MG TABS Take 3 mg by mouth daily as needed (constipation).     Polyethyl Glycol-Propyl Glycol (SYSTANE OP) Place 1 drop into both eyes daily as needed (dry eyes).     polyethylene glycol powder (GLYCOLAX /MIRALAX ) 17 GM/SCOOP powder Take 1 Container by mouth once.     PRESCRIPTION MEDICATION Inhale into the lungs at bedtime. CPAP     Respiratory Therapy Supplies (NEBULIZER MASK ADULT) MISC 1 kit by Does not apply route as directed. 1 each 1   rivaroxaban  (XARELTO ) 20 MG TABS tablet Take 1 tablet (20 mg total) by mouth daily with supper. 90 tablet 1   triamcinolone  (NASACORT ) 55 MCG/ACT AERO nasal inhaler Place 2 sprays into the nose daily. 50.7 mL 1   Vibegron (GEMTESA) 75 MG TABS Take 75 mg by mouth daily.  benzonatate  (TESSALON ) 200 MG capsule Take 1 capsule (200 mg total) by mouth 2 (two) times daily as needed for cough. (Patient not taking: Reported on 11/22/2023) 20 capsule 0   Current Facility-Administered Medications on File Prior to Visit  Medication Dose Route Frequency Provider Last Rate Last Admin   tezepelumab -ekko (TEZSPIRE ) 210 MG/1. syringe 210 mg  210 mg Subcutaneous Q28 days Kozlow, Eric J, MD   210 mg at 11/19/23 1610    Review of Systems  Constitutional:  Negative for activity change, appetite change, fatigue, fever and unexpected weight change.  HENT:  Negative for congestion, ear pain, rhinorrhea, sinus pressure and sore throat.   Eyes:  Negative for pain, redness and visual disturbance.  Respiratory:  Negative for cough, shortness of breath and wheezing.   Cardiovascular:  Negative for chest pain and palpitations.  Gastrointestinal:  Negative for abdominal pain, blood in stool, constipation and diarrhea.  Endocrine: Negative for polydipsia and polyuria.  Genitourinary:  Negative for dysuria, frequency and urgency.   Musculoskeletal:  Negative for arthralgias, back pain and myalgias.  Skin:  Negative for pallor and rash.  Allergic/Immunologic: Negative for environmental allergies.  Neurological:  Positive for dizziness and headaches. Negative for tremors, seizures, syncope, facial asymmetry, speech difficulty, weakness and numbness.  Hematological:  Negative for adenopathy. Does not bruise/bleed easily.  Psychiatric/Behavioral:  Negative for decreased concentration and dysphoric mood. The patient is not nervous/anxious.        Feels stressed        Objective:   Physical Exam Constitutional:      General: She is not in acute distress.    Appearance: Normal appearance. She is well-developed. She is obese. She is not ill-appearing or diaphoretic.  HENT:     Head: Normocephalic and atraumatic.     Comments: No facial /head swelling or tenderness     Right Ear: External ear normal.     Left Ear: External ear normal.     Nose: Nose normal.     Mouth/Throat:     Mouth: Mucous membranes are moist.     Pharynx: Oropharynx is clear. No oropharyngeal exudate.  Eyes:     General: No scleral icterus.       Right eye: No discharge.        Left eye: No discharge.     Conjunctiva/sclera: Conjunctivae normal.     Pupils: Pupils are equal, round, and reactive to light.     Comments: No nystagmus  Neck:     Thyroid : No thyromegaly.     Vascular: No carotid bruit or JVD.     Trachea: No tracheal deviation.  Cardiovascular:     Rate and Rhythm: Normal rate and regular rhythm.     Heart sounds: Normal heart sounds. No murmur heard.    No gallop.  Pulmonary:     Effort: Pulmonary effort is normal. No respiratory distress.     Breath sounds: Normal breath sounds. No wheezing or rales.  Abdominal:     General: Bowel sounds are normal. There is no distension or abdominal bruit.     Palpations: Abdomen is soft. There is no mass.     Tenderness: There is no abdominal tenderness.  Musculoskeletal:         General: No tenderness.     Cervical back: Full passive range of motion without pain, normal range of motion and neck supple.     Right lower leg: No edema.     Left lower leg: No edema.  Lymphadenopathy:     Cervical: No cervical adenopathy.  Skin:    General: Skin is warm and dry.     Coloration: Skin is not jaundiced or pale.     Findings: No bruising or rash.  Neurological:     Mental Status: She is alert and oriented to person, place, and time.     Cranial Nerves: No cranial nerve deficit, dysarthria or facial asymmetry.     Sensory: Sensation is intact. No sensory deficit.     Motor: No weakness, tremor, atrophy, abnormal muscle tone, seizure activity or pronator drift.     Coordination: Romberg sign negative. Coordination normal. Finger-Nose-Finger Test normal.     Gait: Gait normal.     Deep Tendon Reflexes: Reflexes are normal and symmetric. Reflexes normal.     Comments: No focal cerebellar signs   Psychiatric:        Mood and Affect: Mood normal.        Behavior: Behavior normal.        Thought Content: Thought content normal.           Assessment & Plan:   Problem List Items Addressed This Visit       Other   Headache - Primary   New / more severe today Frontal and dull with occational sharp pain that radiates to eye areas  Complex medical history  Reviewed last note from Dr Geralyn Knee and last labs  Reasurring neuro exam Takes gabspentin from podiatry and dose was recently dec due to sedation  Blood pressure is ok/stable with ibesartan No neuro deficits Did note and increase in stressors   ESR/crp ordered - want to r/o Temp arteritis CT head ordered given age and new headache with dizziness  Needs follow up with pcp      Relevant Orders   CT HEAD WO CONTRAST ( )   Sedimentation Rate (Completed)   C-reactive protein (Completed)   Dizziness   Thought to be from orthostasis  Reviewed last visit with Dr Geralyn Knee as well as labs  Now has intermittent  headache (frontal) , occational fleeting sharp pain to eye   ESR and CRP ordered  CT head ordered        Relevant Orders   CT HEAD WO CONTRAST ( )   Sedimentation Rate (Completed)   C-reactive protein (Completed)

## 2023-11-24 ENCOUNTER — Encounter: Payer: Self-pay | Admitting: Family Medicine

## 2023-11-24 NOTE — Assessment & Plan Note (Signed)
 Thought to be from orthostasis  Reviewed last visit with Dr Geralyn Knee as well as labs  Now has intermittent headache (frontal) , occational fleeting sharp pain to eye   ESR and CRP ordered  CT head ordered

## 2023-11-24 NOTE — Assessment & Plan Note (Signed)
 New / more severe today Frontal and dull with occational sharp pain that radiates to eye areas  Complex medical history  Reviewed last note from Dr Geralyn Knee and last labs  Reasurring neuro exam Takes gabspentin from podiatry and dose was recently dec due to sedation  Blood pressure is ok/stable with ibesartan No neuro deficits Did note and increase in stressors   ESR/crp ordered - want to r/o Temp arteritis CT head ordered given age and new headache with dizziness  Needs follow up with pcp

## 2023-11-25 ENCOUNTER — Ambulatory Visit: Admitting: Speech Pathology

## 2023-11-25 ENCOUNTER — Encounter: Payer: Self-pay | Admitting: Speech Pathology

## 2023-11-25 DIAGNOSIS — R1313 Dysphagia, pharyngeal phase: Secondary | ICD-10-CM | POA: Diagnosis not present

## 2023-11-25 DIAGNOSIS — R498 Other voice and resonance disorders: Secondary | ICD-10-CM | POA: Diagnosis not present

## 2023-11-25 NOTE — Therapy (Signed)
 OUTPATIENT SPEECH LANGUAGE PATHOLOGY SWALLOW TREATMENT   Patient Name: [name] MRN: [id number] DOB:1945/08/23, 78 y.o., female Today's Date: 11/25/2023  PCP: Judithann Novas MD REFERRING PROVIDER: Fabienne Holter, MD  END OF SESSION:  End of Session - 11/25/23 1020     Visit Number 6    Number of Visits 17    Date for SLP Re-Evaluation 12/18/23    Authorization Type Medicare    SLP Start Time 1020    SLP Stop Time  1100    SLP Time Calculation (min) 40 min    Activity Tolerance Patient tolerated treatment well               Past Medical History:  Diagnosis Date   Allergic rhinitis    Allergy    Arthritis    Asthma    Breast cancer (HCC)    Chronic headache    Diabetes mellitus    Ductal carcinoma in situ (DCIS) of left breast 02/23/2022   DVT (deep venous thrombosis) (HCC)    Dyspnea    Heart murmur    History of radiation therapy    Left Breast 05/02/22-05/30/22- Dr. Retta Caster   Hypertension    Penicillin allergy 01/19/2020   Pulmonary embolism (HCC)    Pulmonary hypertension (HCC)    Seizures (HCC)    Past Surgical History:  Procedure Laterality Date   ABDOMINAL HYSTERECTOMY     partial, has ovaries   BREAST LUMPECTOMY WITH RADIOACTIVE SEED LOCALIZATION Left 03/29/2022   Procedure: LEFT BREAST LUMPECTOMY WITH RADIOACTIVE SEED LOCALIZATION;  Surgeon: Sim Dryer, MD;  Location: [facility] OR;  Service: General;  Laterality: Left;   CARDIAC CATHETERIZATION  12/28/2010   Mod. pulmonary hypertension, normal coronary arteries   CARDIOVERSION N/A 11/23/2020   Procedure: CARDIOVERSION;  Surgeon: Luana Rumple, MD;  Location: [facility];  Service: Cardiovascular;  Laterality: N/A;   CHOLECYSTECTOMY N/A 12/20/2021   Procedure: LAPAROSCOPIC CHOLECYSTECTOMY WITH INTRAOPERATIVE CHOLANGIOGRAM;  Surgeon: Oralee Billow, MD;  Location: [facility];  Service: General;  Laterality: N/A;   DOPPLER ECHOCARDIOGRAPHY  10/08/2011   EF=>55%,mild asymmetric LVH, mod. TR, mod.  PH, mild to mod LA dilatation   ESOPHAGEAL MANOMETRY N/A 06/19/2023   Procedure: ESOPHAGEAL MANOMETRY (EM);  Surgeon: Genell Ken, MD;  Location: [facility];  Service: Gastroenterology;  Laterality: N/A;   IR LUMBAR DISC ASPIRATION W/IMG GUIDE  01/12/2020   KNEE ARTHROSCOPY Left    KNEE SURGERY     Nuclear Stress Test  05/20/2006   No ischemia   PARTIAL HYSTERECTOMY     PLANTAR FASCIA SURGERY     TONSILLECTOMY     Patient Active Problem List   Diagnosis Date Noted   Headache 11/22/2023   Acute insomnia 09/26/2023   SSS (sick sinus syndrome) (HCC) 04/26/2022   Family history of breast cancer 02/28/2022   Family history of colon cancer in mother 02/28/2022   Ductal carcinoma in situ (DCIS) of left breast 02/23/2022   Family history of malignant neoplasm of digestive organs 02/02/2022   Anemia 12/29/2021   Chronic constipation 12/29/2021   Chronic anticoagulation - Xerelto 12/18/2021   Hyperthyroidism 12/18/2021   Status post total left knee replacement 12/09/2021   Presence of right artificial knee joint 10/30/2021   Constipation by delayed colonic transit 09/25/2021   Atypical atrial flutter (HCC) 06/24/2021   Chronic diastolic heart failure (HCC) 06/24/2021   Type 2 diabetes mellitus with diabetic neuropathy, unspecified (HCC) 06/20/2020   Dysphonia 04/08/2020   Discitis of lumbosacral region 01/04/2020  Hyperlipidemia associated with type 2 diabetes mellitus (HCC) 05/07/2019   Dizziness 05/07/2019   Obstructive sleep apnea treated with continuous positive airway pressure (CPAP) 06/03/2018   Left thyroid  nodule 02/06/2018   Insomnia secondary to chronic pain 01/26/2018   Class 3 drug-induced obesity with serious comorbidity and body mass index (BMI) of 40.0 to 44.9 in adult (HCC) 12/03/2017   Graves disease 10/15/2017   Bilateral high frequency sensorineural hearing loss 10/24/2016   Subjective tinnitus, bilateral 10/24/2016   Moderate persistent asthma 07/12/2015    Paroxysmal atrial fibrillation (HCC) 06/03/2015   Neuropathy due to secondary diabetes (HCC) 01/11/2015   Migraine headache without aura 08/24/2014   Allergic sinusitis 03/25/2014   DVT (deep venous thrombosis) hx of  03/25/2014   Osteoarthritis of right knee 03/25/2014    ? of Seizure disorder 03/25/2014   Depression, major, in remission (HCC) 03/25/2014   Hypertension associated with diabetes (HCC) 03/25/2014   GERD (gastroesophageal reflux disease) 03/25/2014   History of DVT (deep vein thrombosis) 03/25/2014   History of pulmonary embolism 10/09/2013   Asthma, moderate persistent 01/24/2011   Pulmonary hypertension (HCC) 01/11/2011    ONSET DATE: 10/14/2023 (referral date)   REFERRING DIAG: R13.10 (ICD-10-CM) - Swallowing dysfunction  THERAPY DIAG: Dysphagia, pharyngeal phase  Other voice and resonance disorders  Rationale for Evaluation and Treatment: Rehabilitation  SUBJECTIVE:   SUBJECTIVE STATEMENT: "I may have to cancel Wednesday - I may have a CT scan"  Pt accompanied by: self  PERTINENT HISTORY: "Pt is a 78 yo female referred for MBS and esophagram by Dr Jerelene Monday. Pt with PMH + for GERD, breast cancer, DVT, seizures, pulmonary HTN, allergic rhinitis, dyspnea, heart mumur, radiation therapy 05/02/2022-05/30/2022 left breast. Pt describes dysphagia to MD as mucus in the throat with frequent throat clearing. ? h/o facial droop and dysarthria on 07/04/23. Has xerostomia, S/P manometry showing nonspecific esoph dysmotiltiy, imcomplete clearance of allow swallows, normal resting LES, slight increased pressure for relaxation of LES, intact peristalsis 06/19/2023. Esophagram completed in 2024 showed penetration of liquids but no aspiration. Patient does endorse some coughing with liquid intake. Patient reports significant issues with coughing and states her allergist informed her to swallow when this occurs but she states this is difficult due to her dry mouth. And if she swallows  water it may cause her to cough. SLP advised to consider using sugar-free lemon drops if Dr. Jerelene Monday is agreeable. She does endorse some voice changes at times. Reports she takes her PPI twice a day and her famotidine  at night. SLP asked if she ever drools to which patient stated on occasion when chewing gum noting this occurs more on the left side. Does appear with slight facial asymmetry on the left with decreased labial closure when taxed with resistance. Denies having any pneumonia stating she got the pneumonia shot."  PAIN: Are you having pain? No   PATIENT GOAL's: Pt shared hopes to improve her swallow and reduce habitual throat clearing  OBJECTIVE:  Note: Objective measures were completed at Evaluation unless otherwise noted. OBJECTIVE:   INSTRUMENTAL SWALLOW STUDY FINDINGS (MBSS 10/03/23) Clinical Impression: Patient presents with mild pharyngeal dysphagia mostly characterized by inadequate laryngeal elevation and closure resulting in minimal trace aspiration of thin liquids on an inconsistent basis.  Chin tuck posture not helpful as it allowed barium to spill into open larynx during the swallow.   Using thin liquid to wash down food worsened penetration and aspiration.  Though patient does not sense trace aspiration cued cough is effective to  help her to clear.  Pharyngeal swallow is strong fortunately.  Trace retention with liquids at vallecula and piriform sinus likely due to pressure issue from known esophageal deficits.     Patient denies any issues with pulmonary infections.  No aspiration or deep penetration of any other consistencies.  Patient was tested both with thin and ultrathin (thin barium halfed with water) -and no significant difference with airway protection noted.     Advised patient if consumption of coffee with large amounts of cream or slightly thicker liquids such as orange juice or Ensure, Glucerna are more comfortable for her to swallow -she may wish to drink these during  her meals.  Encouraged her to drink water to help with hydration and advised that water is pH neutral thus safest liquid to aspirate.  She endorses taking her medications with applesauce for ease of swallow.  Recommended that if she does that to start intake with liquid and drink liquids after swallowing medications with applesauce.  Thank you so much for this consult.    ESOPHAGRAM IMPRESSION (10/03/23): Mild-to-moderate esophageal dysmotility.                                                                                           TREATMENT DATE:   11/25/23:  Joselene continues to report consistent carryover of HEP with mod I.  She demonstrated HEP today with mod I. Her daughter is going to order alginate - Targeted dysphonia with slightly increased vocal intensity in sustained vowel starting with "h" to reduce laryngeal tension and encourage flow phonation. Adel achieved clear phonation on sustained "ha" 7/10 trials - Pitch glides up and down using "ha" also achieved clear phonation - added these to HEP. Kylla continues to required verbal cues and education re: habit of speaking on residual volume resulting in strained, almost aphonic voice. She is to  monitor and try to become aware of this behavior. She continues to required ongoing cueing to ID throat clears and use throat clear alternatives (6 throat clears today)  11/18/23: Endorsed completion of HEP. Re-educated swallow strategy recommended during MBSS to intermittently cough during meal d/t reduced sensation. Provided video feedback and handout with recommendation to aid pt understanding and carryover; pt verbalized understanding. Targeted x100 effortful swallows and x40 Mendelsohn maneuvers today, in which pt able to complete with mod I. Rare throat clear x1 evidenced today; pt endorsed increased mindfulness with utilizing throat clear alternatives at home. Scheduled 1 ST f/u s/p ENT evaluation.   4/30/25Holland Lundborg completes HEP for dysphagia  with mod I. She continues to use hard swallow with a sip to successfully clear phlegm and avoid clearing her throat. Reviewed reflux precautions and LPR. She verbalizes reflux precautions of diet modifications, avoiding mint and caffeine. She drinks decaf tea. No throat clears this session.   11-11-23: Hoarse vocal quality present today. Endorsed voice is worse in morning. ENT visit scheduled late May. Reported completion of swallow HEP, at least 20x/day, but often times more frequently throughout day. Able to teach back and demo swallow exercises x3 with mod I. Did not recall effortful swallows, with re-education provided this date. Pt completed  x20 reps with mod I. Reported suspected s/sx of acid reflux. RSI score of 24 (score of >13 indicative of LPR). Provided instruction and handout of reflux management strategies. Pt verbalized understanding and appreciation. Endorsed overall improvements in swallowing, including being more intentional with duration of mastication and taking pills with puree. Only one throat clear evidenced today but pt aware and able to implement strategy following episode.   10-28-23: Initiated training in throat clear alternatives including hard swallow, sips, sniff-sniff-swallow - and that she may have to do this several times until the sensation passes. We discussed that reflux medications stop acid but they don't eliminate the reflux - information provided on LPR and chronic throat clearing. Initiate HEP for dysphagia - Jovani demonstrated effortful swallow, Masko, Mendelson and tongue press with rare min A after initial instruction and modeling. Reviewed swallow precautions - Laneta is taking meds with puree - instructed her to swallow with intent, be mindful when eating with no distractions and to use effortful swallow with meals. Instructed her to be mindful to chew and swallow, then talk especially when eating out as there are more distractions.   10-23-23: Eval and POC completed.  Initiated education of MBSS results and negative impact of habitual throat clearing. Planning to address throat clear alternatives next session. Recommended pt visit ENT, specifically laryngologist, to further investigate concerns with vocal quality and reported excess mucus.  PATIENT EDUCATION: Education details: see above Person educated: Patient Education method: Explanation Education comprehension: verbalized understanding and needs further education  ASSESSMENT:  CLINICAL IMPRESSION: Patient is a 78 y.o. M who was seen today for treatment. Continued education and instruction of swallow exercises, throat clear alternatives, and reflux management. Pt able to demo with rare min A. Pt would benefit from skilled ST to address dysphagia and voice to optimize safety during swallowing and vocal hygiene.  OBJECTIVE IMPAIRMENTS: include voice disorder and dysphagia. These impairments are limiting patient from effectively communicating at home and in community and safety when swallowing. Factors affecting potential to achieve goals and functional outcome are ability to learn/carryover information and cooperation/participation level. Patient will benefit from skilled SLP services to address above impairments and improve overall function.  REHAB POTENTIAL: Good   GOALS: Goals reviewed with patient? Yes  SHORT TERM GOALS: Target date: 11/20/2023    Pt will accurately demonstrate swallowing exercises with min-A across 2/2 sessions Baseline: 11/11/23, 11/18/23 Goal status: MET  2.  Pt will report daily carryover of HEP across 2/2 sessions Baseline: 11/11/23, 11/18/23 Goal status: MET  3.  Pt will successfully suppress throat clearing across 2/2 sessions with min-A Baseline: 11/18/23 Goal status: ONGONG  4.  Pt will demonstrate understanding of swallow compensations or strategies via teach back with mod-I. Baseline:  Goal status: MET  5.    Pt will verbalize 3 reflux precautions with mod  I Baseline:  Goal status: MET   LONG TERM GOALS: Target date: 12/18/2023  Pt will report reduction in frequency of throat clearing > 1 week with use of trained techniques  Baseline:  Goal status: ONGOING  2.  Pt will report improved score of PROM by at least 2 points by LTG date. Baseline: EAT-10=8 Goal status: ONGOING  3.  Pt will demonstrate swallowing exercises with mod-I across 2/2 sessions. Baseline:  Goal status: ONGOING  4.    Pt will be independent with HEP Baseline:  Goal status: ONGOING  PLAN:  SLP FREQUENCY: 2x/week  SLP DURATION: 8 weeks  PLANNED INTERVENTIONS: Aspiration precaution training, Pharyngeal strengthening  exercises, Environmental controls, Cueing hierachy, SLP instruction and feedback, Compensatory strategies, Patient/family education, and [address] Treatment of speech (30 or 45 min)     Zaira Iacovelli, Dareen Ebbing, CCC-SLP 11/25/2023, 10:21 AM

## 2023-11-25 NOTE — Patient Instructions (Addendum)
   Swallow with intent - think about your swallowing - stop talking, focus on the chew and swallow, then talk  Be mindful and swallow with purpose, concentrate on swallow  Use hard swallow whenever you eat and drink  Remind Tracy to order the Reflux Gourmet - and start after every meal and before bed - 1 tsp  Twice daily:  10 Haaaa - keeping good volume and steady voice (focus on not letting it crack  10 pitch glides up, the 10 pitch glides down (Haaaa-Haaaa)  10 sentences in a high pitch like you are calling to a neighbor over the fence  10 sentences in a low authoritative voice like you are the boss   This week - be mindful of when you run out of air when speaking and are straining with a hoarse strained voice - stop and take a breath - don't keep talking

## 2023-11-26 ENCOUNTER — Ambulatory Visit: Payer: Self-pay

## 2023-11-26 NOTE — Telephone Encounter (Signed)
 Pt advised of Dr. Belva Boyden comments

## 2023-11-26 NOTE — Telephone Encounter (Signed)
 Usually steroids and antibiotics tend to cause yeast infections Xanax is to relax you for the CT scan (make sure you have a driver) This should not cause a yeast infection   If any problems , let us  know

## 2023-11-26 NOTE — Telephone Encounter (Signed)
 Noted.

## 2023-11-26 NOTE — Telephone Encounter (Signed)
  Chief Complaint: Foot pain Symptoms: right foot pain with swelling Frequency: started 11/15/2023 Pertinent Negatives: Patient denies CP, SOB Disposition: [] ED /[] Urgent Care (no appt availability in office) / [x] Appointment(In office/virtual)/ []  [facility]/ [] Home Care/ [] Refused Recommended Disposition /[]  Mobile Bus/ []  Follow-up with PCP Additional Notes: patient calling with continued concerned for right foot pain with swelling. Patient endorses pain to top of foot with swelling. Patient endorses that she twisted her foot slightly on 5/1-patient reports pain didn't started until 5/2 when she went to the ED. Per protocol, patient is recommended to be seen in three days. Appointment schedule for tomorrow 11/27/2023 at 8:20 AM. Patient verbalized understanding of the plan and all questions answered.    Copied from CRM [id number]. Topic: Clinical - Red Word Triage >> Nov 26, 2023 10:18 AM Magdalene [employer] wrote: Red Word that prompted transfer to Nurse Triage: Painful foot, swollen. Reason for Disposition  [1] MODERATE pain (e.g., interferes with normal activities, limping) AND [2] present > 3 days  Answer Assessment - Initial Assessment Questions 1. ONSET: "When did the pain start?"      Started 11/15/2023 2. LOCATION: "Where is the pain located?"      Right foot 3. PAIN: "How bad is the pain?"    (Scale 1-10; or mild, moderate, severe)  - MILD (1-3): doesn't interfere with normal activities.   - MODERATE (4-7): interferes with normal activities (e.g., work or school) or awakens from sleep, limping.   - SEVERE (8-10): excruciating pain, unable to do any normal activities, unable to walk.      8 out of 10 4. WORK OR EXERCISE: "Has there been any recent work or exercise that involved this part of the body?"      Patient was at the beauty shop and twisted her foot slightly on 11/14/2023 5. CAUSE: "What do you think is causing the foot pain?"     unsure 6. OTHER SYMPTOMS: "Do you  have any other symptoms?" (e.g., leg pain, rash, fever, numbness)     Swelling to foot  Protocols used: Foot Pain-A-AH

## 2023-11-26 NOTE — Telephone Encounter (Signed)
 Patient reports that whenever she takes new medication, she has found that she needs to take medication to prevent a yeast infection. Clarified with patient that this is for any new medication. No symptoms. Patient is due to take Xanax tomorrow prior to her CT scan ordered by Dr. Malissa Se. She has never taken Xanax before. Please follow up.   Copied from CRM [id number]. Topic: Clinical - Medication Question >> Nov 26, 2023 10:16 AM Vivian Z wrote: Reason for CRM: Patient called stating that she has a CT scan scheduled tomorrow 11/27/23 and she was prescribed a pill to take before the CT scan but when she takes new mdication she gets a yeast infection and would like something prescribed for that. Reason for Disposition  Prescription request for new medicine (not a refill)  Answer Assessment - Initial Assessment Questions 1. NAME of MEDICINE: "What medicine(s) are you calling about?"     Medication to prevent yeast infection 2. QUESTION: "What is your question?" (e.g., double dose of medicine, side effect)     Patient states that whenever she has to take a new medication, she needs to take medication to prevent yeast infection. Patient is scheduled to take Xanax prior to her CT scan tomorrow. Patient states she is needing medication to prevent a yeast infection to take tomorrow.  Protocols used: Medication Question Call-A-AH

## 2023-11-27 ENCOUNTER — Ambulatory Visit: Payer: Self-pay | Admitting: Family Medicine

## 2023-11-27 ENCOUNTER — Encounter: Payer: Self-pay | Admitting: Family Medicine

## 2023-11-27 ENCOUNTER — Ambulatory Visit (INDEPENDENT_AMBULATORY_CARE_PROVIDER_SITE_OTHER): Admitting: Family Medicine

## 2023-11-27 ENCOUNTER — Encounter: Admitting: Speech Pathology

## 2023-11-27 ENCOUNTER — Ambulatory Visit
Admission: RE | Admit: 2023-11-27 | Discharge: 2023-11-27 | Discharge status: Home / Self Care | Source: Ambulatory Visit | Attending: Family Medicine | Admitting: Family Medicine

## 2023-11-27 VITALS — BP 120/64 | HR 91 | Temp 98.0°F | Ht 63.5 in | Wt 194.4 lb

## 2023-11-27 DIAGNOSIS — M79671 Pain in right foot: Secondary | ICD-10-CM | POA: Insufficient documentation

## 2023-11-27 DIAGNOSIS — R519 Headache, unspecified: Secondary | ICD-10-CM

## 2023-11-27 DIAGNOSIS — R42 Dizziness and giddiness: Secondary | ICD-10-CM

## 2023-11-27 DIAGNOSIS — S93491D Sprain of other ligament of right ankle, subsequent encounter: Secondary | ICD-10-CM | POA: Diagnosis not present

## 2023-11-27 DIAGNOSIS — I272 Pulmonary hypertension, unspecified: Secondary | ICD-10-CM | POA: Diagnosis not present

## 2023-11-27 NOTE — Assessment & Plan Note (Signed)
 Acute, resolved Mild right lateral ankle sprain now resolved without instability and no focal pain. I feel her brace may be rubbing at the dorsal part of her foot and irritating her foot pain more so I have instructed her to hold off on wearing the brace.  She will continue ankle strengthening exercises

## 2023-11-27 NOTE — Assessment & Plan Note (Signed)
 Acute worsening of chronic issue She points to a sore area over dorsal foot that is raised and swollen, appears most consistently associated with possible  capsulitits, synoviitis, cyst etc. Given NSAIDs contraindicated and she is at max tolerated dose of gabapentin  I have suggested she continue elevation and icing of the foot and to increase Voltaren  gel to 4 times daily. She was given foot stretching exercises to increase range of motion of the foot.

## 2023-11-27 NOTE — Assessment & Plan Note (Signed)
 Recent evaluation showed iron deficiency and she is now on iron supplement she is not anemic She also appeared to be somewhat dehydrated and blood sugar was 67 the day lab testing was done.  She has been hydrating aggressively. At that office visit she did have some evidence of orthostasis.

## 2023-11-27 NOTE — Progress Notes (Signed)
 Patient ID: [name], female    DOB: 12/09/1945, 78 y.o.   MRN: [id number]  This visit was conducted in person.  BP 120/64   Pulse 91   Temp 98 F (36.7 C) (Oral)   Ht 5' 3.5" (1.613 m)   Wt 194 lb 6.4 oz (88.2 kg)   SpO2 98%   BMI 33.90 kg/m    CC:  Chief Complaint  Patient presents with   Foot pain    Bilateral primarily the right foot. Very sore and paifl    Subjective:   HPI: [name] is a 78 y.o. female presenting on 11/27/2023 for Foot pain (Bilateral primarily the right foot. Very sore and paifl)     Right foot pain...  Followed by podiatry. Last OV with podiatry 4/20 for hammer toe, bilateral foot pain, diabetic polyneuropathy, preulcerative calluses and capsulitis of left ankle Using gabapentin  300/300/600, but had to decrease dose given sedation.   Seen in ER on 5/2 for   right ankle sprain Foot x-rays are normal without sign of acute fracture.  Prior postsurgical changes.   Place d in ASo brace    Recent OV with Dr. Geralyn Knee  followed by OV with  Dr. Malissa Se for  headache, dizziness. Has head CT pending today.  Neg sed rate and crp  Iron deficient.. now on iron supplement... gatorade given gradually decline in  renal function.  Dizziness .Aaron Aas Neg lab eval felt most likely orthostasis  NSAIDs contraindicated given Xarelto  anticoagulation.    Brace felt like causing soreness stopped wearing.  Minimal ankle  pain at this point.  Now mainly dorsal and lateral foot pain.  No heat, no redness  She is applying biofreeze on  foot.Aaron Aas apply voltaren  gel 2  time a day.   Still intermittent dizziness, headaches off and on  When she is feeling dizzy, she starts having a headache.  Dizziness primarily  with sitting to standing.  Relevant past medical, surgical, family and social history reviewed and updated as indicated. Interim medical history since our last visit reviewed. Allergies and medications reviewed and updated. Outpatient Medications Prior to  Visit  Medication Sig Dispense Refill   ADVAIR  HFA 230-21 MCG/ACT inhaler Inhale 2 puffs into the lungs 2 (two) times daily. 2 puffs 1-2 times daily depending on disease activity. 36 g 5   albuterol  (PROVENTIL ) (2.5 MG/3ML) 0.083% nebulizer solution Take 3 mLs (2.5 mg total) by nebulization every 6 (six) hours as needed for wheezing or shortness of breath. 150 mL 1   albuterol  (VENTOLIN  HFA) 108 (90 Base) MCG/ACT inhaler INHALE 2 PUFFS BY MOUTH EVERY 6 HOURS AS NEEDED FOR WHEEZING OR SHORTNESS OF BREATH 18 g 5   ALPRAZolam (XANAX) 0.5 MG tablet Take one pill by mouth one hour before procedure (CT scan) 2 tablet 0   anastrozole  (ARIMIDEX ) 1 MG tablet TAKE 1 TABLET(1 MG) BY MOUTH DAILY 90 tablet 3   atorvastatin  (LIPITOR) 10 MG tablet TAKE 1 TABLET(10 MG) BY MOUTH DAILY 90 tablet 3   Blood Glucose Monitoring Suppl (ONETOUCH VERIO) w/Device KIT Use to check blood sugar up to 2 times a day 1 kit 0   budesonide  (PULMICORT ) 0.5 MG/2ML nebulizer solution Take 2 mLs (0.5 mg total) by nebulization in the morning and at bedtime. 120 mL 0   cyclobenzaprine  (FLEXERIL ) 10 MG tablet Take 0.5-1 tablets (5-10 mg total) by mouth at bedtime as needed for muscle spasms. 20 tablet 0   desloratadine  (CLARINEX ) 5 MG tablet TAKE  1 TABLET(5 MG) BY MOUTH DAILY 90 tablet 1   EPINEPHrine  0.3 mg/0.3 mL IJ SOAJ injection Inject 0.3 mg into the muscle as needed for anaphylaxis. USE AS DIRECTED FOR LIFE THREATENING ALLERGIC REACTIONS 2 each 1   estradiol  (ESTRACE ) 0.1 MG/GM vaginal cream Place 1 Applicatorful vaginally 2 (two) times a week. (As Needed)     famotidine  (PEPCID ) 40 MG tablet Take 1 tablet (40 mg total) by mouth at bedtime. Take 1 (ONE) tablet by mouth every evening. 90 tablet 1   fluconazole  (DIFLUCAN ) 150 MG tablet Take one pill by mouth now and repeat dose in 3 days 2 tablet 0   fluticasone  (FLOVENT  HFA) 220 MCG/ACT inhaler Inhale 2 puffs into the lungs 2 (two) times daily. 2 puffs 2 times daily during flare up. 36  g 1   gabapentin  (NEURONTIN ) 300 MG capsule Take one capsule in the morning, one at lunch and two at bedtime 360 capsule 3   glucose blood (ONETOUCH VERIO) test strip 1 strip 2 (two) times daily     GLYXAMBI  10-5 MG TABS TAKE 1 TABLET BY MOUTH DAILY 30 tablet 5   hydrOXYzine  (ATARAX ) 10 MG tablet Take 1 tablet (10 mg total) by mouth daily as needed for itching. 30 tablet 2   ipratropium (ATROVENT ) 0.06 % nasal spray Place 2 sprays into both nostrils 2 (two) times daily as needed for rhinitis. 45 mL 1   irbesartan  (AVAPRO ) 150 MG tablet TAKE 1 TABLET(150 MG) BY MOUTH DAILY 90 tablet 2   Lactase (DAIRY-RELIEF PO) Take 1 capsule by mouth daily as needed (eating dairy).     Lancets (ONETOUCH DELICA PLUS LANCET33G) MISC USE TO CHECK BLOOD SUGAR UP TO TWICE DAILY 100 each 5   Magnesium 500 MG TABS Take 500 mg by mouth 2 (two) times daily.     Menthol , Topical Analgesic, (ABSORBINE PLUS JR EX) Apply 1 patch topically daily as needed (pain).     Menthol -Methyl Salicylate (SALONPAS JET SPRAY EX) Apply 1 spray topically daily as needed (knee pain).     methimazole  (TAPAZOLE ) 5 MG tablet Take 1 tablet (5 mg total) by mouth daily. 90 tablet 1   montelukast  (SINGULAIR ) 10 MG tablet Take 1 tablet (10 mg total) by mouth at bedtime. For asthma control. 90 tablet 1   Mouthwashes (BIOTENE DRY MOUTH MT) Use as directed 1 Dose in the mouth or throat daily as needed (dry mouth).     Nebulizer MISC 1 Device by Does not apply route as directed. 1 each 1   ondansetron  (ZOFRAN -ODT) 4 MG disintegrating tablet Take 1 tablet (4 mg total) by mouth every 8 (eight) hours as needed for nausea or vomiting. 20 tablet 0   oxybutynin (DITROPAN-XL) 10 MG 24 hr tablet Take 10 mg by mouth daily.     pantoprazole  (PROTONIX ) 40 MG tablet Take 1 tablet (40 mg total) by mouth 2 (two) times daily. 180 tablet 1   Plecanatide (TRULANCE) 3 MG TABS Take 3 mg by mouth daily as needed (constipation).     Polyethyl Glycol-Propyl Glycol (SYSTANE  OP) Place 1 drop into both eyes daily as needed (dry eyes).     polyethylene glycol powder (GLYCOLAX /MIRALAX ) 17 GM/SCOOP powder Take 1 Container by mouth once.     PRESCRIPTION MEDICATION Inhale into the lungs at bedtime. CPAP     Respiratory Therapy Supplies (NEBULIZER MASK ADULT) MISC 1 kit by Does not apply route as directed. 1 each 1   rivaroxaban  (XARELTO ) 20 MG TABS tablet Take  1 tablet (20 mg total) by mouth daily with supper. 90 tablet 1   triamcinolone  (NASACORT ) 55 MCG/ACT AERO nasal inhaler Place 2 sprays into the nose daily. 50.7 mL 1   Vibegron (GEMTESA) 75 MG TABS Take 75 mg by mouth daily.     benzonatate  (TESSALON ) 200 MG capsule Take 1 capsule (200 mg total) by mouth 2 (two) times daily as needed for cough. (Patient not taking: Reported on 11/22/2023) 20 capsule 0   Facility-Administered Medications Prior to Visit  Medication Dose Route Frequency Provider Last Rate Last Admin   tezepelumab -ekko (TEZSPIRE ) 210 MG/1. syringe 210 mg  210 mg Subcutaneous Q28 days Kozlow, Eric J, MD   210 mg at 11/19/23 1610     Per HPI unless specifically indicated in ROS section below Review of Systems  Constitutional:  Negative for fatigue and fever.  HENT:  Negative for congestion.   Eyes:  Negative for pain.  Respiratory:  Negative for cough and shortness of breath.   Cardiovascular:  Negative for chest pain, palpitations and leg swelling.  Gastrointestinal:  Negative for abdominal pain.  Genitourinary:  Negative for dysuria and vaginal bleeding.  Musculoskeletal:  Negative for back pain.  Neurological:  Positive for dizziness and headaches. Negative for syncope and light-headedness.  Psychiatric/Behavioral:  Negative for dysphoric mood.    Objective:  BP 120/64   Pulse 91   Temp 98 F (36.7 C) (Oral)   Ht 5' 3.5" (1.613 m)   Wt 194 lb 6.4 oz (88.2 kg)   SpO2 98%   BMI 33.90 kg/m   Wt Readings from Last 3 Encounters:  11/27/23 194 lb 6.4 oz (88.2 kg)  11/22/23 190 lb (86.2  kg)  11/18/23 194 lb (88 kg)      Physical Exam Constitutional:      General: She is not in acute distress.    Appearance: Normal appearance. She is well-developed. She is not ill-appearing or toxic-appearing.  HENT:     Head: Normocephalic.     Right Ear: Hearing, tympanic membrane, ear canal and external ear normal. Tympanic membrane is not erythematous, retracted or bulging.     Left Ear: Hearing, tympanic membrane, ear canal and external ear normal. Tympanic membrane is not erythematous, retracted or bulging.     Nose: No mucosal edema or rhinorrhea.     Right Sinus: No maxillary sinus tenderness or frontal sinus tenderness.     Left Sinus: No maxillary sinus tenderness or frontal sinus tenderness.     Mouth/Throat:     Pharynx: Uvula midline.  Eyes:     General: Lids are normal. Lids are everted, no foreign bodies appreciated.     Conjunctiva/sclera: Conjunctivae normal.     Pupils: Pupils are equal, round, and reactive to light.  Neck:     Thyroid : No thyroid  mass or thyromegaly.     Vascular: No carotid bruit.     Trachea: Trachea normal.  Cardiovascular:     Rate and Rhythm: Normal rate and regular rhythm.     Pulses: Normal pulses.     Heart sounds: Normal heart sounds, S1 normal and S2 normal. No murmur heard.    No friction rub. No gallop.  Pulmonary:     Effort: Pulmonary effort is normal. No tachypnea or respiratory distress.     Breath sounds: Normal breath sounds. No decreased breath sounds, wheezing, rhonchi or rales.  Abdominal:     General: Bowel sounds are normal.     Palpations: Abdomen is soft.  Tenderness: There is no abdominal tenderness.  Musculoskeletal:     Cervical back: Normal range of motion and neck supple.     Right ankle: Normal. No tenderness. Normal range of motion. Normal pulse.     Right Achilles Tendon: Normal.     Left ankle: Normal. No tenderness. Normal range of motion. Normal pulse.     Left Achilles Tendon: Normal.     Right  foot: Normal range of motion. Tenderness and bony tenderness present. No swelling, Charcot foot, foot drop or prominent metatarsal heads.     Left foot: Normal. Normal range of motion. No swelling, deformity, bunion, prominent metatarsal heads, laceration, tenderness or bony tenderness.     Comments: Focal swelling at  proximal MTP, slightly red, not hot.  Skin:    General: Skin is warm and dry.     Findings: No rash.  Neurological:     Mental Status: She is alert.  Psychiatric:        Mood and Affect: Mood is not anxious or depressed.        Speech: Speech normal.        Behavior: Behavior normal. Behavior is cooperative.        Thought Content: Thought content normal.        Judgment: Judgment normal.       Results for orders placed or performed in visit on 11/22/23  Sedimentation Rate   Collection Time: 11/22/23 11:48 AM  Result Value Ref Range   Sed Rate 10 0 - 30 mm/hr  C-reactive protein   Collection Time: 11/22/23 11:48 AM  Result Value Ref Range   CRP <1.0 0.5 - 20.0 mg/dL   *Note: Due to a large number of results and/or encounters for the requested time period, some results have not been displayed. A complete set of results can be found in Results Review.    Assessment and Plan  Right foot pain Assessment & Plan: Acute worsening of chronic issue She points to a sore area over dorsal foot that is raised and swollen, appears most consistently associated with possible  capsulitits, synoviitis, cyst etc. Given NSAIDs contraindicated and she is at max tolerated dose of gabapentin  I have suggested she continue elevation and icing of the foot and to increase Voltaren  gel to 4 times daily. She was given foot stretching exercises to increase range of motion of the foot.   Acute nonintractable headache, unspecified headache type Assessment & Plan: Acute, now intermittent and improved some.  Seems to be associated with dizziness. Negative testing for temporal arteritis. Head  CT pending. Possible migraines given patient history.   Sprain of anterior talofibular ligament of right ankle, subsequent encounter  Dizziness Assessment & Plan: Recent evaluation showed iron deficiency and she is now on iron supplement she is not anemic She also appeared to be somewhat dehydrated and blood sugar was 67 the day lab testing was done.  She has been hydrating aggressively. At that office visit she did have some evidence of orthostasis.    Pulmonary hypertension (HCC) Assessment & Plan: Acute, resolved Mild right lateral ankle sprain now resolved without instability and no focal pain. I feel her brace may be rubbing at the dorsal part of her foot and irritating her foot pain more so I have instructed her to hold off on wearing the brace.  She will continue ankle strengthening exercises     No follow-ups on file.   Herby Lolling, MD

## 2023-11-27 NOTE — Patient Instructions (Addendum)
 Increase Voltaren  gel  to 4 times daily. Ice and elevate. Hold ASO brace given could be rubbing on dorsal foot. Start foot exercises.

## 2023-11-27 NOTE — Assessment & Plan Note (Signed)
 Acute, now intermittent and improved some.  Seems to be associated with dizziness. Negative testing for temporal arteritis. Head CT pending. Possible migraines given patient history.

## 2023-11-29 ENCOUNTER — Other Ambulatory Visit

## 2023-11-29 ENCOUNTER — Encounter: Payer: Self-pay | Admitting: Family Medicine

## 2023-11-29 ENCOUNTER — Ambulatory Visit (INDEPENDENT_AMBULATORY_CARE_PROVIDER_SITE_OTHER): Admitting: Family Medicine

## 2023-11-29 VITALS — BP 120/60 | HR 46 | Temp 98.2°F | Ht 63.5 in | Wt 194.4 lb

## 2023-11-29 DIAGNOSIS — R519 Headache, unspecified: Secondary | ICD-10-CM | POA: Diagnosis not present

## 2023-11-29 DIAGNOSIS — R42 Dizziness and giddiness: Secondary | ICD-10-CM | POA: Diagnosis not present

## 2023-11-29 NOTE — Progress Notes (Signed)
 Patient ID: [name], female    DOB: June 12, 1946, 78 y.o.   MRN: [id number]  This visit was conducted in person.  BP 120/60   Pulse (!) 46   Temp 98.2 F (36.8 C) (Oral)   Ht 5' 3.5" (1.613 m)   Wt 194 lb 6 oz (88.2 kg)   SpO2 98%   BMI 33.89 kg/m    CC:  Chief Complaint  Patient presents with   Headache    1 week follow up   Foot Pain    Right    Subjective:   HPI: [name] is a 78 y.o. female presenting on 11/29/2023 for Headache (1 week follow up) and Foot Pain (Right)  Patient seen for similar issues 2 days ago.  She was encouraged to keep today's follow-up so that we could review head CT to evaluate the increased frequency of headaches she has been having. Reviewed the head CT in detail with the patient.  No new findings, stable since 2023 and normal for age.  No cause of headaches seen. Of note when she saw Dr. Malissa Se last week she had negative CRP and sed rate so temporal arteritis was ruled out. Blood pressure has remained well-controlled. She does have a history of migraine headache  At last office visit she was also diagnosed with likely foot sprain/capsulitis.  I had recommended increasing Voltaren  gel to 4 times daily.     Today she reports she has been very tired lately in last few  months worse in last  2 days but did take med to calm her before CT.  Not really having dizziness or headache in last few days... but not doing much. Had been having intermittent dizziness when standing.. slight associated headace  Dr. Lara Plants  decrease gabapenitin some recently.. 600 BID, 1200 at PM.. Still tired with  300 mg BID and 600 mg at night.  She is not sure if gabapentin  helping all.   She is on multiple sedating meds.  Rarely using flexeril .  Has been using atarax  more lately for itching.Aaron Aas  about every 2 nights.  ON ditropan for OAB... has helped some. Relevant past medical, surgical, family and social history reviewed and updated as indicated. Interim  medical history since our last visit reviewed. Allergies and medications reviewed and updated. Outpatient Medications Prior to Visit  Medication Sig Dispense Refill   ADVAIR  HFA 230-21 MCG/ACT inhaler Inhale 2 puffs into the lungs 2 (two) times daily. 2 puffs 1-2 times daily depending on disease activity. 36 g 5   albuterol  (PROVENTIL ) (2.5 MG/3ML) 0.083% nebulizer solution Take 3 mLs (2.5 mg total) by nebulization every 6 (six) hours as needed for wheezing or shortness of breath. 150 mL 1   albuterol  (VENTOLIN  HFA) 108 (90 Base) MCG/ACT inhaler INHALE 2 PUFFS BY MOUTH EVERY 6 HOURS AS NEEDED FOR WHEEZING OR SHORTNESS OF BREATH 18 g 5   anastrozole  (ARIMIDEX ) 1 MG tablet TAKE 1 TABLET(1 MG) BY MOUTH DAILY 90 tablet 3   atorvastatin  (LIPITOR) 10 MG tablet TAKE 1 TABLET(10 MG) BY MOUTH DAILY 90 tablet 3   Blood Glucose Monitoring Suppl (ONETOUCH VERIO) w/Device KIT Use to check blood sugar up to 2 times a day 1 kit 0   budesonide  (PULMICORT ) 0.5 MG/2ML nebulizer solution Take 2 mLs (0.5 mg total) by nebulization in the morning and at bedtime. 120 mL 0   cyclobenzaprine  (FLEXERIL ) 10 MG tablet Take 0.5-1 tablets (5-10 mg total) by mouth at bedtime as needed  for muscle spasms. 20 tablet 0   desloratadine  (CLARINEX ) 5 MG tablet TAKE 1 TABLET(5 MG) BY MOUTH DAILY 90 tablet 1   EPINEPHrine  0.3 mg/0.3 mL IJ SOAJ injection Inject 0.3 mg into the muscle as needed for anaphylaxis. USE AS DIRECTED FOR LIFE THREATENING ALLERGIC REACTIONS 2 each 1   estradiol  (ESTRACE ) 0.1 MG/GM vaginal cream Place 1 Applicatorful vaginally 2 (two) times a week. (As Needed)     famotidine  (PEPCID ) 40 MG tablet Take 1 tablet (40 mg total) by mouth at bedtime. Take 1 (ONE) tablet by mouth every evening. 90 tablet 1   fluconazole  (DIFLUCAN ) 150 MG tablet Take one pill by mouth now and repeat dose in 3 days 2 tablet 0   fluticasone  (FLOVENT  HFA) 220 MCG/ACT inhaler Inhale 2 puffs into the lungs 2 (two) times daily. 2 puffs 2 times  daily during flare up. 36 g 1   gabapentin  (NEURONTIN ) 300 MG capsule Take one capsule in the morning, one at lunch and two at bedtime 360 capsule 3   glucose blood (ONETOUCH VERIO) test strip 1 strip 2 (two) times daily     GLYXAMBI  10-5 MG TABS TAKE 1 TABLET BY MOUTH DAILY 30 tablet 5   hydrOXYzine  (ATARAX ) 10 MG tablet Take 1 tablet (10 mg total) by mouth daily as needed for itching. 30 tablet 2   ipratropium (ATROVENT ) 0.06 % nasal spray Place 2 sprays into both nostrils 2 (two) times daily as needed for rhinitis. 45 mL 1   irbesartan  (AVAPRO ) 150 MG tablet TAKE 1 TABLET(150 MG) BY MOUTH DAILY 90 tablet 2   Lactase (DAIRY-RELIEF PO) Take 1 capsule by mouth daily as needed (eating dairy).     Lancets (ONETOUCH DELICA PLUS LANCET33G) MISC USE TO CHECK BLOOD SUGAR UP TO TWICE DAILY 100 each 5   Magnesium 500 MG TABS Take 500 mg by mouth 2 (two) times daily.     Menthol , Topical Analgesic, (ABSORBINE PLUS JR EX) Apply 1 patch topically daily as needed (pain).     Menthol -Methyl Salicylate (SALONPAS JET SPRAY EX) Apply 1 spray topically daily as needed (knee pain).     methimazole  (TAPAZOLE ) 5 MG tablet Take 1 tablet (5 mg total) by mouth daily. 90 tablet 1   montelukast  (SINGULAIR ) 10 MG tablet Take 1 tablet (10 mg total) by mouth at bedtime. For asthma control. 90 tablet 1   Mouthwashes (BIOTENE DRY MOUTH MT) Use as directed 1 Dose in the mouth or throat daily as needed (dry mouth).     Nebulizer MISC 1 Device by Does not apply route as directed. 1 each 1   ondansetron  (ZOFRAN -ODT) 4 MG disintegrating tablet Take 1 tablet (4 mg total) by mouth every 8 (eight) hours as needed for nausea or vomiting. 20 tablet 0   oxybutynin (DITROPAN-XL) 10 MG 24 hr tablet Take 10 mg by mouth daily.     pantoprazole  (PROTONIX ) 40 MG tablet Take 1 tablet (40 mg total) by mouth 2 (two) times daily. 180 tablet 1   Plecanatide (TRULANCE) 3 MG TABS Take 3 mg by mouth daily as needed (constipation).     Polyethyl  Glycol-Propyl Glycol (SYSTANE OP) Place 1 drop into both eyes daily as needed (dry eyes).     polyethylene glycol powder (GLYCOLAX /MIRALAX ) 17 GM/SCOOP powder Take 1 Container by mouth once.     PRESCRIPTION MEDICATION Inhale into the lungs at bedtime. CPAP     Respiratory Therapy Supplies (NEBULIZER MASK ADULT) MISC 1 kit by Does not apply route  as directed. 1 each 1   rivaroxaban  (XARELTO ) 20 MG TABS tablet Take 1 tablet (20 mg total) by mouth daily with supper. 90 tablet 1   triamcinolone  (NASACORT ) 55 MCG/ACT AERO nasal inhaler Place 2 sprays into the nose daily. 50.7 mL 1   ALPRAZolam (XANAX) 0.5 MG tablet Take one pill by mouth one hour before procedure (CT scan) 2 tablet 0   Vibegron (GEMTESA) 75 MG TABS Take 75 mg by mouth daily.     Facility-Administered Medications Prior to Visit  Medication Dose Route Frequency Provider Last Rate Last Admin   tezepelumab -ekko (TEZSPIRE ) 210 MG/1. syringe 210 mg  210 mg Subcutaneous Q28 days Kozlow, Eric J, MD   210 mg at 11/19/23 5784     Per HPI unless specifically indicated in ROS section below Review of Systems  Constitutional:  Positive for fatigue. Negative for fever.  HENT:  Negative for congestion.   Eyes:  Negative for pain.  Respiratory:  Negative for cough and shortness of breath.   Cardiovascular:  Negative for chest pain, palpitations and leg swelling.  Gastrointestinal:  Negative for abdominal pain.  Genitourinary:  Negative for dysuria and vaginal bleeding.  Musculoskeletal:  Negative for back pain.  Neurological:  Negative for syncope, light-headedness and headaches.  Psychiatric/Behavioral:  Negative for dysphoric mood.    Objective:  BP 120/60   Pulse (!) 46   Temp 98.2 F (36.8 C) (Oral)   Ht 5' 3.5" (1.613 m)   Wt 194 lb 6 oz (88.2 kg)   SpO2 98%   BMI 33.89 kg/m   Wt Readings from Last 3 Encounters:  11/29/23 194 lb 6 oz (88.2 kg)  11/27/23 194 lb 6.4 oz (88.2 kg)  11/22/23 190 lb (86.2 kg)      Physical  Exam Constitutional:      General: She is not in acute distress.    Appearance: Normal appearance. She is well-developed. She is not ill-appearing or toxic-appearing.     Comments: Very sleepy appearing but able to hold a conversation and fully oriented and aware.  HENT:     Head: Normocephalic.     Right Ear: Hearing, tympanic membrane, ear canal and external ear normal. Tympanic membrane is not erythematous, retracted or bulging.     Left Ear: Hearing, tympanic membrane, ear canal and external ear normal. Tympanic membrane is not erythematous, retracted or bulging.     Nose: No mucosal edema or rhinorrhea.     Right Sinus: No maxillary sinus tenderness or frontal sinus tenderness.     Left Sinus: No maxillary sinus tenderness or frontal sinus tenderness.     Mouth/Throat:     Pharynx: Uvula midline.  Eyes:     General: Lids are normal. Lids are everted, no foreign bodies appreciated.     Conjunctiva/sclera: Conjunctivae normal.     Pupils: Pupils are equal, round, and reactive to light.  Neck:     Thyroid : No thyroid  mass or thyromegaly.     Vascular: No carotid bruit.     Trachea: Trachea normal.  Cardiovascular:     Rate and Rhythm: Normal rate and regular rhythm.     Pulses: Normal pulses.     Heart sounds: Normal heart sounds, S1 normal and S2 normal. No murmur heard.    No friction rub. No gallop.  Pulmonary:     Effort: Pulmonary effort is normal. No tachypnea or respiratory distress.     Breath sounds: Normal breath sounds. No decreased breath sounds, wheezing, rhonchi or  rales.  Abdominal:     General: Bowel sounds are normal.     Palpations: Abdomen is soft.     Tenderness: There is no abdominal tenderness.  Musculoskeletal:     Cervical back: Normal range of motion and neck supple.  Skin:    General: Skin is warm and dry.     Findings: No rash.  Neurological:     Mental Status: She is alert.     Cranial Nerves: Cranial nerves 2-12 are intact.     Sensory:  Sensation is intact.     Motor: Motor function is intact.  Psychiatric:        Mood and Affect: Mood is not anxious or depressed.        Speech: Speech normal.        Behavior: Behavior normal. Behavior is cooperative.        Thought Content: Thought content normal.        Judgment: Judgment normal.       Results for orders placed or performed in visit on 11/22/23  Sedimentation Rate   Collection Time: 11/22/23 11:48 AM  Result Value Ref Range   Sed Rate 10 0 - 30 mm/hr  C-reactive protein   Collection Time: 11/22/23 11:48 AM  Result Value Ref Range   CRP <1.0 0.5 - 20.0 mg/dL   *Note: Due to a large number of results and/or encounters for the requested time period, some results have not been displayed. A complete set of results can be found in Results Review.    Assessment and Plan  Acute nonintractable headache, unspecified headache type  Dizziness Assessment & Plan: Dizziness most likely secondary to being on multiple sedating medications.  We discussed these in detail today and will try to pull back on as many as possible.  It is also possible she is taking 2 medications for overactive bladder.  She will call for review of her medications when she gets home. There is also possibly a component of orthostatic hypotension although her baseline blood pressure is well-controlled on her current medication.  I have encouraged her to keep up with liquids as well. Head CT reviewed: Unremarkable.   Try to hold the hydroxyzine ... use topical med for itching.  Call with clarification on what overactive bladder med you are using... could try a trial off of if needed.  Try a trial of holding gabapentin  during the day.. but continue the 600 mg  at night. Push fluids.     No follow-ups on file.   Herby Lolling, MD

## 2023-11-29 NOTE — Patient Instructions (Addendum)
 Try to hold the hydroxyzine ... use topical med for itching.  Call with clarification on what overactive bladder med you are using... could try a trial off of if needed.  Try a trial of holding gabapentin  during the day.. but continue the 600 mg  at night. Push fluids.  Try to Gatorade Zero or Diet Powerade -    Diabetic protein shake like Diabeta or Glucerna (diabetic protein shake)

## 2023-11-29 NOTE — Assessment & Plan Note (Signed)
 Dizziness most likely secondary to being on multiple sedating medications.  We discussed these in detail today and will try to pull back on as many as possible.  It is also possible she is taking 2 medications for overactive bladder.  She will call for review of her medications when she gets home. There is also possibly a component of orthostatic hypotension although her baseline blood pressure is well-controlled on her current medication.  I have encouraged her to keep up with liquids as well. Head CT reviewed: Unremarkable.   Try to hold the hydroxyzine ... use topical med for itching.  Call with clarification on what overactive bladder med you are using... could try a trial off of if needed.  Try a trial of holding gabapentin  during the day.. but continue the 600 mg  at night. Push fluids.

## 2023-12-02 ENCOUNTER — Other Ambulatory Visit: Payer: Self-pay | Admitting: Family Medicine

## 2023-12-02 MED ORDER — GEMTESA 75 MG PO TABS: ORAL_TABLET | ORAL | 0 refills | Status: AC

## 2023-12-03 ENCOUNTER — Ambulatory Visit (INDEPENDENT_AMBULATORY_CARE_PROVIDER_SITE_OTHER): Admitting: Neurology

## 2023-12-03 ENCOUNTER — Encounter: Payer: Self-pay | Admitting: Podiatry

## 2023-12-03 DIAGNOSIS — G4733 Obstructive sleep apnea (adult) (pediatric): Secondary | ICD-10-CM | POA: Diagnosis not present

## 2023-12-03 DIAGNOSIS — G8929 Other chronic pain: Secondary | ICD-10-CM

## 2023-12-03 DIAGNOSIS — G43009 Migraine without aura, not intractable, without status migrainosus: Secondary | ICD-10-CM

## 2023-12-03 DIAGNOSIS — E1340 Other specified diabetes mellitus with diabetic neuropathy, unspecified: Secondary | ICD-10-CM

## 2023-12-03 DIAGNOSIS — I484 Atypical atrial flutter: Secondary | ICD-10-CM

## 2023-12-04 ENCOUNTER — Telehealth: Payer: Self-pay | Admitting: Podiatry

## 2023-12-04 NOTE — Progress Notes (Unsigned)
 [name]

## 2023-12-04 NOTE — Telephone Encounter (Signed)
 Diabetic shoe order and notes faxed to [facility]. Confirmation in email

## 2023-12-10 ENCOUNTER — Telehealth: Payer: Self-pay | Admitting: Adult Health

## 2023-12-10 NOTE — Telephone Encounter (Signed)
 Spoke to patient informed patient waiting for sleep study to be read. When results and recommendations are given will call her back Pt expressed understanding and thanked me for calling .

## 2023-12-10 NOTE — Telephone Encounter (Signed)
 At 11:59 pt left a vm asking for results to her sleep study

## 2023-12-11 ENCOUNTER — Telehealth: Payer: Self-pay | Admitting: Family Medicine

## 2023-12-11 NOTE — Telephone Encounter (Signed)
 Copied from CRM [id number]. Topic: General - Other >> Dec 11, 2023  3:14 PM Kita Perish H wrote: Reason for CRM: Patient is calling to speak with Abe Abed to explain some paperwork to her that Dr. Cherlyn Cornet will be receiving for patient, states it's too much to send a message.  [employer] [phone]

## 2023-12-11 NOTE — Telephone Encounter (Signed)
 Spoke with Ms. Hinkson.  She states she saw Dr. Lara Plants and he placed an order for some diabetic shoes with [facility].  The forms will also need to be signed by Dr. Cherlyn Cornet.  She wanted to let us  know they will be faxing the forms over to be completed.  I let her know we will keep an eye out for the fax.

## 2023-12-12 ENCOUNTER — Ambulatory Visit: Admitting: Podiatry

## 2023-12-12 ENCOUNTER — Institutional Professional Consult (permissible substitution) (INDEPENDENT_AMBULATORY_CARE_PROVIDER_SITE_OTHER): Admitting: Otolaryngology

## 2023-12-12 ENCOUNTER — Ambulatory Visit: Payer: Self-pay | Admitting: Adult Health

## 2023-12-12 NOTE — Procedures (Signed)
 [facility] 78 year old female 07-18-1945   HOME SLEEP TEST REPORT ( by Watch PAT)   STUDY DATE:  12-04-2023     ORDERING CLINICIAN: Clem Currier, NP ,interpreted by  Neomia Banner, MD  REFERRING CLINICIAN: Herby Lolling, MD   CLINICAL INFORMATION/HISTORY:  OSA on CPAP,  last seen 11-05-2023. Hx of acute headache , associated with intermittent dizziness , foot pain,  urinary urge,  sedating medications ( gabapentin ) .  11/05/23:   [name] is a 78 y.o. female with a history of OSA on CPAP. Returns today for follow-up. Stopped using it because it was stating it had a leak. She went to her DME company but she reports that they just told her to go home and use it. She has the dream wear mask.  She has tried other masks before.      Epworth sleepiness score: 3 /24.   BMI:  32. 6 kg/m   Neck Circumference: NA   FINDINGS:   Sleep Summary:   Total Recording Time (hours, min):   7 h 42 minutes      Total Sleep Time (hours, min):    6 h 31 minutes             Percent REM (%):      11%    Sleep latency was 22 minutes long and REM sleep latency 157 minutes long.   There were 48 minutes of wakefulness after sleep onset time.  The sleep architecture was fragmented.                              Respiratory Indices:   Calculated pAHI (per CMS guideline): 10.1/h , by AASM guidelines this AHI would be 25.3/h.                          REM pAHI:    7.1/h       , no central events.                                       NREM pAHI:   10.5/h                           Positional AHI:  slept for 202 minutes on her left side , with an AHI of 3.6/h versus supine AHI of 19/h.     Snoring:   The mean Volume was 41 dB. Mild to moderate snoring for > 50% of recorded sleep time.                                             Oxygen Saturation Statistics:   Oxygen Saturation (%) Mean:      93%          O2 Saturation Range (%):   Between a nadir of 87%  with a maximum saturation of 98%                                    O2 Saturation (minutes) <89%:  Less than 1 minute        Pulse Rate Statistics:   Pulse Mean (bpm):   72 bpm              Pulse Range:     Between 51 and 110 bpm.  This Home sleep Test device cannot give information about cardiac rhythm only the heart rate is reflected here.            IMPRESSION:  This HST confirms the presence of mild all obstructive sleep apnea with an AHI of 10 following CMS scoring criteria.  No central events were present, no significant oxygen desaturations were witnessed, an overall reduction in REM sleep time and a prolonged REM sleep latency are likely attributed to medication effect.    RECOMMENDATION: Since the patient struggled so much with CPAP and her apnea is mild and not associated with hypoxia, I recommend to discontinue CPAP Therapy.  Treatment plan B is to actively try to sleep in nonsupine position.    There was a significant apnea hypopnea difference between supine and nonsupine sleep.  Snoring seemed to have been independent of position, and may need to be addressed with a dental device or by an ear, nose and throat specialists's evaluation.   Weight loss may also help to reduce snoring and apnea further.    INTERPRETING PHYSICIAN:  Neomia Banner, MD  [facility] Sleep Board certified by The [employer] of Sleep Medicine and Diplomate of the [employer] of Sleep Medicine. Board certified In Neurology through the ABPN, Fellow of the [employer] of Neu[employer].

## 2023-12-12 NOTE — Progress Notes (Signed)
 No further CPAP therapy is needed.

## 2023-12-13 ENCOUNTER — Other Ambulatory Visit: Payer: Self-pay | Admitting: Neurology

## 2023-12-13 ENCOUNTER — Telehealth: Payer: Self-pay | Admitting: *Deleted

## 2023-12-13 DIAGNOSIS — Z86711 Personal history of pulmonary embolism: Secondary | ICD-10-CM

## 2023-12-13 DIAGNOSIS — D0512 Intraductal carcinoma in situ of left breast: Secondary | ICD-10-CM

## 2023-12-13 DIAGNOSIS — J309 Allergic rhinitis, unspecified: Secondary | ICD-10-CM

## 2023-12-13 DIAGNOSIS — I82501 Chronic embolism and thrombosis of unspecified deep veins of right lower extremity: Secondary | ICD-10-CM

## 2023-12-13 DIAGNOSIS — I272 Pulmonary hypertension, unspecified: Secondary | ICD-10-CM

## 2023-12-13 NOTE — Telephone Encounter (Signed)
 Received call from pt requesting referral be placed for physical therapy.  Pt states she is experiencing joint tightness and left shoulder tightness.  Verbal orders received and placed per MD request.  Pt educated and verbalized understanding. Pt also educated that once she has finished with PT, she can reach out to our office for referral to the live strong program with the [facility].  Pt verbalized understanding.

## 2023-12-16 ENCOUNTER — Telehealth: Payer: Self-pay | Admitting: Speech Pathology

## 2023-12-16 ENCOUNTER — Telehealth: Payer: Self-pay | Admitting: Adult Health

## 2023-12-16 NOTE — Telephone Encounter (Signed)
   [name] called our front office asking if she should attend her next ST session on Wed 12/18/23 at 10:15 since she missed her ENT appointment. I left VM that  yes, she does need to attend her next ST session Wed 12/18/23 at 10:15.

## 2023-12-16 NOTE — Telephone Encounter (Signed)
 I called pt and she stated that we should be expecting a order stating that medicare will pay for new machine.  I relayed that she had a HST relaying that she does not need cpap,  and she feels that she felt more rested using the machine. (Her daughter noted this as well).  I told her that also a order for a machine was done , then noted a later order discontinuing machine.  I sent message to [employer] relating this .  I did not want her charged for a machine if she did not need one. (I wanted to make sure  she understood that if she felt like she needed one, she could pay for one out of pocket. (Insurance would not pay for one if she did not OSA.  She verbalized understanding.  I also noted that a mychart message from Dr. Albertina Hugger had not been read.  I printed this and mailed to pt.

## 2023-12-16 NOTE — Telephone Encounter (Signed)
 Pt is asking for a call from RN to discuss the new CPAP

## 2023-12-17 ENCOUNTER — Ambulatory Visit: Admitting: Podiatry

## 2023-12-17 ENCOUNTER — Ambulatory Visit

## 2023-12-17 DIAGNOSIS — J4550 Severe persistent asthma, uncomplicated: Secondary | ICD-10-CM

## 2023-12-18 ENCOUNTER — Encounter: Payer: Self-pay | Admitting: Speech Pathology

## 2023-12-18 NOTE — Therapy (Signed)
 OUTPATIENT PHYSICAL THERAPY  UPPER EXTREMITY ONCOLOGY EVALUATION  Patient Name: [name] MRN: [id number] DOB:December 31, 1945, 78 y.o., female Today's Date: 12/19/2023  END OF SESSION:  PT End of Session - 12/19/23 1524     Visit Number 1    Number of Visits 12    Date for PT Re-Evaluation 01/30/24    PT Start Time 1525   Pt late; had appt time wrong, but able to be seen.   PT Stop Time 1623    PT Time Calculation (min) 58 min    Activity Tolerance Patient tolerated treatment well    Behavior During Therapy WFL for tasks assessed/performed             Past Medical History:  Diagnosis Date   Allergic rhinitis    Allergy    Arthritis    Asthma    Breast cancer (HCC)    Chronic headache    Diabetes mellitus    Ductal carcinoma in situ (DCIS) of left breast 02/23/2022   DVT (deep venous thrombosis) (HCC)    Dyspnea    Heart murmur    History of radiation therapy    Left Breast 05/02/22-05/30/22- Dr. Retta Caster   Hypertension    Penicillin allergy 01/19/2020   Pulmonary embolism (HCC)    Pulmonary hypertension (HCC)    Seizures (HCC)    Past Surgical History:  Procedure Laterality Date   ABDOMINAL HYSTERECTOMY     partial, has ovaries   BREAST LUMPECTOMY WITH RADIOACTIVE SEED LOCALIZATION Left 03/29/2022   Procedure: LEFT BREAST LUMPECTOMY WITH RADIOACTIVE SEED LOCALIZATION;  Surgeon: Sim Dryer, MD;  Location: [facility];  Service: General;  Laterality: Left;   CARDIAC CATHETERIZATION  12/28/2010   Mod. pulmonary hypertension, normal coronary arteries   CARDIOVERSION N/A 11/23/2020   Procedure: CARDIOVERSION;  Surgeon: Luana Rumple, MD;  Location: [facility];  Service: Cardiovascular;  Laterality: N/A;   CHOLECYSTECTOMY N/A 12/20/2021   Procedure: LAPAROSCOPIC CHOLECYSTECTOMY WITH INTRAOPERATIVE CHOLANGIOGRAM;  Surgeon: Oralee Billow, MD;  Location: [facility];  Service: General;  Laterality: N/A;   DOPPLER ECHOCARDIOGRAPHY  10/08/2011   EF=>55%,mild asymmetric LVH,  mod. TR, mod. PH, mild to mod LA dilatation   ESOPHAGEAL MANOMETRY N/A 06/19/2023   Procedure: ESOPHAGEAL MANOMETRY (EM);  Surgeon: Genell Ken, MD;  Location: [facility];  Service: Gastroenterology;  Laterality: N/A;   IR LUMBAR DISC ASPIRATION W/IMG GUIDE  01/12/2020   KNEE ARTHROSCOPY Left    KNEE SURGERY     Nuclear Stress Test  05/20/2006   No ischemia   PARTIAL HYSTERECTOMY     PLANTAR FASCIA SURGERY     TONSILLECTOMY     Patient Active Problem List   Diagnosis Date Noted   Right foot pain 11/27/2023   Headache 11/22/2023   Acute insomnia 09/26/2023   Sprain of anterior talofibular ligament of right ankle 07/02/2022   SSS (sick sinus syndrome) (HCC) 04/26/2022   Family history of breast cancer 02/28/2022   Family history of colon cancer in mother 02/28/2022   Ductal carcinoma in situ (DCIS) of left breast 02/23/2022   Family history of malignant neoplasm of digestive organs 02/02/2022   Anemia 12/29/2021   Chronic constipation 12/29/2021   Chronic anticoagulation - Xerelto 12/18/2021   Hyperthyroidism 12/18/2021   Status post total left knee replacement 12/09/2021   Presence of right artificial knee joint 10/30/2021   Constipation by delayed colonic transit 09/25/2021   Atypical atrial flutter (HCC) 06/24/2021   Chronic diastolic heart failure (HCC) 06/24/2021   Type  2 diabetes mellitus with diabetic neuropathy, unspecified (HCC) 06/20/2020   Dysphonia 04/08/2020   Discitis of lumbosacral region 01/04/2020   Hyperlipidemia associated with type 2 diabetes mellitus (HCC) 05/07/2019   Dizziness 05/07/2019   Obstructive sleep apnea treated with continuous positive airway pressure (CPAP) 06/03/2018   Left thyroid  nodule 02/06/2018   Insomnia secondary to chronic pain 01/26/2018   Class 3 drug-induced obesity with serious comorbidity and body mass index (BMI) of 40.0 to 44.9 in adult (HCC) 12/03/2017   Graves disease 10/15/2017   Bilateral high frequency sensorineural hearing  loss 10/24/2016   Subjective tinnitus, bilateral 10/24/2016   Moderate persistent asthma 07/12/2015   Paroxysmal atrial fibrillation (HCC) 06/03/2015   Neuropathy due to secondary diabetes (HCC) 01/11/2015   Migraine headache without aura 08/24/2014   Allergic sinusitis 03/25/2014   DVT (deep venous thrombosis) hx of  03/25/2014   Osteoarthritis of right knee 03/25/2014    ? of Seizure disorder 03/25/2014   Depression, major, in remission (HCC) 03/25/2014   Hypertension associated with diabetes (HCC) 03/25/2014   GERD (gastroesophageal reflux disease) 03/25/2014   History of DVT (deep vein thrombosis) 03/25/2014   History of pulmonary embolism 10/09/2013   Asthma, moderate persistent 01/24/2011   Pulmonary hypertension (HCC) 01/11/2011    PCP:   REFERRING PROVIDER: Cameron Cea, MD  REFERRING DIAG: s/p Left Lumpectomy with DCIS/joint tightness  THERAPY DIAG:  Ductal carcinoma in situ of left breast  Chronic left shoulder pain  Shoulder impingement syndrome, left  ONSET DATE: Off and on since Oct 5,2024 after a fall  Rationale for Evaluation and Treatment: Rehabilitation  SUBJECTIVE:                                                                                                                                                                                           SUBJECTIVE STATEMENT:   Pt is having tightness and pain in her left shoulder after she does things. She uses ice at times. Sometimes that helps.It bothers her to lift heavy things  or to put things in and out of the cabinet repetiively. She used to work for the [employer] and injured her arm with lifting when she felt a pop in her shoulder. She has had pain off and on since then. This onset approx Oct 2024. Pt is presently having rehab for SLP at [facility].  PERTINENT HISTORY:  PT is s/p Left lumpectomy for DCIS on 03/29/2022. She had radiation which ended on 05/30/2022. She is presently taking  Anastrozole , Right TKA,Prior DVT, PE, asthma, hypertension, Type II diabetes with neuropathy PAIN:  Are you having pain? No not  at rest NPRS scale: 0-8/10/10 Pain location: left shoulder Pain orientation: Left  PAIN TYPE: aching and sharp Pain description: intermittent  Aggravating factors: lifting, reaching repetitively, sometimes sleeping on that side, Relieving factors: sometimes ice, sometimes heat  PRECAUTIONS: Prior DVT, PE, asthma, hypertension, Type II diabetes with neuropathy  RED FLAGS: None   WEIGHT BEARING RESTRICTIONS: No  FALLS:  Has patient fallen in last 6 months? No  LIVING ENVIRONMENT: Lives with: lives alone Lives in: House/apartment Stairs: Yes; External: 3 steps; can reach both  OCCUPATION: retired from the post office  LEISURE: likes to go to senior center, chair yoga,  HAND DOMINANCE: bilateral   PRIOR LEVEL OF FUNCTION: Independent  PATIENT GOALS: I want to feel better,  improve ROM and strength on the left   OBJECTIVE: Note: Objective measures were completed at Evaluation unless otherwise noted.  COGNITION: Overall cognitive status: Within functional limits for tasks assessed   PALPATION: Tender left UT,and posterior left shoulder  OBSERVATIONS / OTHER ASSESSMENTS: UT compensation left greater than right with overhead reaching, + Left impingement test,  SENSATION: Light touch:    POSTURE: forward head, rounded shoulders  UPPER EXTREMITY AROM/PROM:  A/PROM RIGHT   eval   Shoulder extension 65  Shoulder flexion 147  Shoulder abduction 152  Shoulder internal rotation 60  Shoulder external rotation 93    (Blank rows = not tested)  A/PROM LEFT   eval  Shoulder extension 60  Shoulder flexion 140, UT compensation,+ shoulder  Shoulder abduction 120, UT compensation, + shoulder  Shoulder internal rotation 44+  Shoulder external rotation 77    (Blank rows = not tested)  CERVICAL AROM: All within fxl limits:     UPPER  EXTREMITY STRENGTH: Strength 5/5 IR, 4/5 ER with pain, Flex 3+/5 pain, Abd 3-/5  LYMPHEDEMA ASSESSMENTS:   SURGERY TYPE/DATE: Left Lumpectomy  NUMBER OF LYMPH NODES REMOVED: 0  CHEMOTHERAPY: No  RADIATION:Yes, ended 05/30/2022  HORMONE TREATMENT: Anastrozole   INFECTIONS: NO    FUNCTIONAL TESTS:    QUICK DASH SURVEY: 18.18%                                                                                                                           TREATMENT DATE: . 12/19/2023 Pt was late getting started due to having the wrong appt time. She was able to be seen due to cancellation. Discussed POC, impingement and causes of impingement, upper trap compensation, weakness, LOS, treatment interventions. Instructed pt is standing scapular retraction with yellow band x 10, and standing shoulder extension with yellow band x 10. Also performed left UE ER x 7 in limited ROM but did not add to HEP due to discomfort in elbow. Pt advised to discontinue any exercise that causes pain.    PATIENT EDUCATION:  Education details: Impingement, compensation, SR and shoulder ext yellow band exs, LOS, interventions Person educated: Patient Education method: Explanation, Demonstration, and Handouts Education comprehension: verbalized understanding and returned demonstration  HOME EXERCISE PROGRAM: Scapular  retraction and shoulder extension with yellow band x 10 ea to do every other day.  ASSESSMENT:  CLINICAL IMPRESSION: Patient is a 78 y.o. female who was seen today for physical therapy evaluation and treatment for complaints of left shoulder pain and stiffness which has occurred on and off for several years. This episode started after falling in October and injuring her knee, but did not realize she had pain in shoulder as well. She presents with decreased left shoulder ROM, with pain and compensation.  She is tender at the left UT, and posterior shoulder, and she has a positive impingement test. She is  also having therapy with SLP at [facility] and isn't sure how much longer that will last. She will benefit from skilled PT to address deficts and return to PLOF   OBJECTIVE IMPAIRMENTS: decreased activity tolerance, decreased knowledge of condition, decreased ROM, decreased strength, impaired UE functional use, postural dysfunction, and pain.   ACTIVITY LIMITATIONS: lifting, sleeping, bed mobility, and reach over head  PARTICIPATION LIMITATIONS: cleaning and other activities requiring repetitive movements  PERSONAL FACTORS: Age and 3+ comorbidities: Cancer, Diabetes, OA are also affecting patient's functional outcome.   REHAB POTENTIAL: Good  CLINICAL DECISION MAKING: Stable/uncomplicated  EVALUATION COMPLEXITY: Low  GOALS: Goals reviewed with patient? Yes  SHORT TERM GOALS=LONG TERM GOALS: Target date: 01/30/2024  Pt will be independent and compliant with HEP to improve Left shoulder ROM and strength Baseline: Goal status: INITIAL  2.  Pt will have decreased left shoulder pain by 50% or greater Baseline:  Goal status: INITIAL  3.  Pt will have left shoulder abduction 140 degrees  without UT compensation for improved reahcing Baseline:  Goal status: INITIAL  4.  Pt will have left shoulder strength 4-4+/5 without increased pain Baseline:  Goal status: INITIAL  5.  Pt will be able to reach into cabinet repetitively with  lb with 0-minimal discomfort Baseline:  Goal status: INITIAL  PLAN:  PT FREQUENCY: 1-2x/week  PT DURATION: 6 weeks  PLANNED INTERVENTIONS: 97164- PT Re-evaluation, 97750- Physical Performance Testing, 97110-Therapeutic exercises, 97530- Therapeutic activity, [id number]- Neuromuscular re-education, 97535- Self Care, [id number]- Manual therapy, and Patient/Family education  PLAN FOR NEXT SESSION: Review band exs given at evaluation, progress strength/stability and decrease UT compensation, update HEP, PROM.  Latisha Poland, PT 12/19/2023, 5:02 PM

## 2023-12-19 ENCOUNTER — Ambulatory Visit: Attending: Hematology and Oncology

## 2023-12-19 ENCOUNTER — Other Ambulatory Visit: Payer: Self-pay

## 2023-12-19 DIAGNOSIS — D0512 Intraductal carcinoma in situ of left breast: Secondary | ICD-10-CM | POA: Diagnosis not present

## 2023-12-19 DIAGNOSIS — M25512 Pain in left shoulder: Secondary | ICD-10-CM | POA: Insufficient documentation

## 2023-12-19 DIAGNOSIS — M7542 Impingement syndrome of left shoulder: Secondary | ICD-10-CM | POA: Diagnosis not present

## 2023-12-19 DIAGNOSIS — G8929 Other chronic pain: Secondary | ICD-10-CM | POA: Diagnosis not present

## 2023-12-20 ENCOUNTER — Other Ambulatory Visit: Payer: Self-pay | Admitting: *Deleted

## 2023-12-20 DIAGNOSIS — D0512 Intraductal carcinoma in situ of left breast: Secondary | ICD-10-CM

## 2023-12-24 ENCOUNTER — Ambulatory Visit: Payer: Self-pay

## 2023-12-24 NOTE — Telephone Encounter (Signed)
 Copied from CRM [id number]. Topic: Clinical - Medication Question >> Dec 24, 2023  1:13 PM Shereese L wrote: Reason for CRM: patient is calling to go over her BP to get an understanding on the reading [phone]

## 2023-12-24 NOTE — Telephone Encounter (Signed)
 This RN attempted 1st attempt. Phone went straight to voicemail. LVM.

## 2023-12-24 NOTE — Telephone Encounter (Signed)
 FYI Only or Action Required?: FYI only for provider  Patient was last seen in primary care on 11/29/2023 by Judithann Novas, MD. Called Nurse Triage reporting Blood Pressure. Symptoms began   Triage Disposition: Information or Advice Only Call  Patient/caregiver understands and will follow disposition?:  Reason for Disposition  General information question, no triage required and triager able to answer question  Answer Assessment - Initial Assessment Questions 1. REASON FOR CALL or QUESTION: "What is your reason for calling today?" or "How can I best help you?" or "What question do you have that I can help answer?"     Pt states she had recently bought a pulse ox and didn't understand how to read it. Was calling for clarification on the readings. Instructed pt on what the reading meant. HR was 84 and O2 was 98%  Protocols used: Information Only Call - No Triage-A-AH

## 2023-12-25 ENCOUNTER — Ambulatory Visit

## 2023-12-25 DIAGNOSIS — G8929 Other chronic pain: Secondary | ICD-10-CM

## 2023-12-25 DIAGNOSIS — D0512 Intraductal carcinoma in situ of left breast: Secondary | ICD-10-CM

## 2023-12-25 DIAGNOSIS — M25512 Pain in left shoulder: Secondary | ICD-10-CM | POA: Diagnosis not present

## 2023-12-25 DIAGNOSIS — M7542 Impingement syndrome of left shoulder: Secondary | ICD-10-CM

## 2023-12-25 NOTE — Therapy (Signed)
 OUTPATIENT PHYSICAL THERAPY  UPPER EXTREMITY ONCOLOGY TREATMENT  Patient Name: [name] MRN: [id number] DOB:June 30, 1946, 78 y.o., female Today's Date: 12/25/2023  END OF SESSION:  PT End of Session - 12/25/23 1459     Visit Number 2    Number of Visits 12    Date for PT Re-Evaluation 01/30/24    PT Start Time 1500    PT Stop Time 1545    PT Time Calculation (min) 45 min    Activity Tolerance Patient tolerated treatment well    Behavior During Therapy [facility] for tasks assessed/performed             Past Medical History:  Diagnosis Date   Allergic rhinitis    Allergy    Arthritis    Asthma    Breast cancer (HCC)    Chronic headache    Diabetes mellitus    Ductal carcinoma in situ (DCIS) of left breast 02/23/2022   DVT (deep venous thrombosis) (HCC)    Dyspnea    Heart murmur    History of radiation therapy    Left Breast 05/02/22-05/30/22- Dr. Retta Caster   Hypertension    Penicillin allergy 01/19/2020   Pulmonary embolism (HCC)    Pulmonary hypertension (HCC)    Seizures (HCC)    Past Surgical History:  Procedure Laterality Date   ABDOMINAL HYSTERECTOMY     partial, has ovaries   BREAST LUMPECTOMY WITH RADIOACTIVE SEED LOCALIZATION Left 03/29/2022   Procedure: LEFT BREAST LUMPECTOMY WITH RADIOACTIVE SEED LOCALIZATION;  Surgeon: Sim Dryer, MD;  Location: [facility] OR;  Service: General;  Laterality: Left;   CARDIAC CATHETERIZATION  12/28/2010   Mod. pulmonary hypertension, normal coronary arteries   CARDIOVERSION N/A 11/23/2020   Procedure: CARDIOVERSION;  Surgeon: Luana Rumple, MD;  Location: [facility] ENDOSCOPY;  Service: Cardiovascular;  Laterality: N/A;   CHOLECYSTECTOMY N/A 12/20/2021   Procedure: LAPAROSCOPIC CHOLECYSTECTOMY WITH INTRAOPERATIVE CHOLANGIOGRAM;  Surgeon: Oralee Billow, MD;  Location: [facility] ORS;  Service: General;  Laterality: N/A;   DOPPLER ECHOCARDIOGRAPHY  10/08/2011   EF=>55%,mild asymmetric LVH, mod. TR, mod. PH, mild to mod LA dilatation    ESOPHAGEAL MANOMETRY N/A 06/19/2023   Procedure: ESOPHAGEAL MANOMETRY (EM);  Surgeon: Genell Ken, MD;  Location: [facility] ENDOSCOPY;  Service: Gastroenterology;  Laterality: N/A;   IR LUMBAR DISC ASPIRATION W/IMG GUIDE  01/12/2020   KNEE ARTHROSCOPY Left    KNEE SURGERY     Nuclear Stress Test  05/20/2006   No ischemia   PARTIAL HYSTERECTOMY     PLANTAR FASCIA SURGERY     TONSILLECTOMY     Patient Active Problem List   Diagnosis Date Noted   Right foot pain 11/27/2023   Headache 11/22/2023   Acute insomnia 09/26/2023   Sprain of anterior talofibular ligament of right ankle 07/02/2022   SSS (sick sinus syndrome) (HCC) 04/26/2022   Family history of breast cancer 02/28/2022   Family history of colon cancer in mother 02/28/2022   Ductal carcinoma in situ (DCIS) of left breast 02/23/2022   Family history of malignant neoplasm of digestive organs 02/02/2022   Anemia 12/29/2021   Chronic constipation 12/29/2021   Chronic anticoagulation - Xerelto 12/18/2021   Hyperthyroidism 12/18/2021   Status post total left knee replacement 12/09/2021   Presence of right artificial knee joint 10/30/2021   Constipation by delayed colonic transit 09/25/2021   Atypical atrial flutter (HCC) 06/24/2021   Chronic diastolic heart failure (HCC) 06/24/2021   Type 2 diabetes mellitus with diabetic neuropathy, unspecified (HCC) 06/20/2020   Dysphonia  04/08/2020   Discitis of lumbosacral region 01/04/2020   Hyperlipidemia associated with type 2 diabetes mellitus (HCC) 05/07/2019   Dizziness 05/07/2019   Obstructive sleep apnea treated with continuous positive airway pressure (CPAP) 06/03/2018   Left thyroid  nodule 02/06/2018   Insomnia secondary to chronic pain 01/26/2018   Class 3 drug-induced obesity with serious comorbidity and body mass index (BMI) of 40.0 to 44.9 in adult (HCC) 12/03/2017   Graves disease 10/15/2017   Bilateral high frequency sensorineural hearing loss 10/24/2016   Subjective tinnitus,  bilateral 10/24/2016   Moderate persistent asthma 07/12/2015   Paroxysmal atrial fibrillation (HCC) 06/03/2015   Neuropathy due to secondary diabetes (HCC) 01/11/2015   Migraine headache without aura 08/24/2014   Allergic sinusitis 03/25/2014   DVT (deep venous thrombosis) hx of  03/25/2014   Osteoarthritis of right knee 03/25/2014    ? of Seizure disorder 03/25/2014   Depression, major, in remission (HCC) 03/25/2014   Hypertension associated with diabetes (HCC) 03/25/2014   GERD (gastroesophageal reflux disease) 03/25/2014   History of DVT (deep vein thrombosis) 03/25/2014   History of pulmonary embolism 10/09/2013   Asthma, moderate persistent 01/24/2011   Pulmonary hypertension (HCC) 01/11/2011    PCP:   REFERRING PROVIDER: Cameron Cea, MD  REFERRING DIAG: s/p Left Lumpectomy with DCIS/joint tightness  THERAPY DIAG:  Ductal carcinoma in situ of left breast  Chronic left shoulder pain  Shoulder impingement syndrome, left  ONSET DATE: Off and on since Oct 5,2024 after a fall  Rationale for Evaluation and Treatment: Rehabilitation  SUBJECTIVE:                                                                                                                                                                                           SUBJECTIVE STATEMENT:  12/25/2023 I tried the exercises 3-4 times and I didn't have any pain. My left shoulder has been bothering me a little more over the last few days. Some pain at the lateral arm right now if I move it. Sometimes when I drive I feel a pain shoot through it and I have to use 1 arm to drive.     EVAL Pt is having tightness and pain in her left shoulder after she does things. She uses ice at times. Sometimes that helps.It bothers her to lift heavy things  or to put things in and out of the cabinet repetiively. She used to work for the [employer] and injured her arm with lifting when she felt a pop in her shoulder. She has had pain  off and on since then. This onset approx Oct 2024. Pt is presently having  rehab for SLP at [facility].  PERTINENT HISTORY:  PT is s/p Left lumpectomy for DCIS on 03/29/2022. She had radiation which ended on 05/30/2022. She is presently taking Anastrozole , Right TKA,Prior DVT, PE, asthma, hypertension, Type II diabetes with neuropathy PAIN:  Are you having pain? No not at rest NPRS scale: 0-6/10 Pain location: left shoulder Pain orientation: Left  PAIN TYPE: aching and sharp Pain description: intermittent  Aggravating factors: lifting, reaching repetitively, sometimes sleeping on that side, Relieving factors: sometimes ice, sometimes heat  PRECAUTIONS: Prior DVT, PE, asthma, hypertension, Type II diabetes with neuropathy  RED FLAGS: None   WEIGHT BEARING RESTRICTIONS: No  FALLS:  Has patient fallen in last 6 months? No  LIVING ENVIRONMENT: Lives with: lives alone Lives in: House/apartment Stairs: Yes; External: 3 steps; can reach both  OCCUPATION: retired from the post office  LEISURE: likes to go to senior center, chair yoga,  HAND DOMINANCE: bilateral   PRIOR LEVEL OF FUNCTION: Independent  PATIENT GOALS: I want to feel better,  improve ROM and strength on the left   OBJECTIVE: Note: Objective measures were completed at Evaluation unless otherwise noted.  COGNITION: Overall cognitive status: Within functional limits for tasks assessed   PALPATION: Tender left UT,and posterior left shoulder  OBSERVATIONS / OTHER ASSESSMENTS: UT compensation left greater than right with overhead reaching, + Left impingement test,  SENSATION: Light touch:    POSTURE: forward head, rounded shoulders  UPPER EXTREMITY AROM/PROM:  A/PROM RIGHT   eval   Shoulder extension 65  Shoulder flexion 147  Shoulder abduction 152  Shoulder internal rotation 60  Shoulder external rotation 93    (Blank rows = not tested)  A/PROM LEFT   eval  Shoulder extension 60  Shoulder  flexion 140, UT compensation,+ shoulder  Shoulder abduction 120, UT compensation, + shoulder  Shoulder internal rotation 44+  Shoulder external rotation 77    (Blank rows = not tested)  CERVICAL AROM: All within fxl limits:     UPPER EXTREMITY STRENGTH: Strength 5/5 IR, 4/5 ER with pain, Flex 3+/5 pain, Abd 3-/5  LYMPHEDEMA ASSESSMENTS:   SURGERY TYPE/DATE: Left Lumpectomy  NUMBER OF LYMPH NODES REMOVED: 0  CHEMOTHERAPY: No  RADIATION:Yes, ended 05/30/2022  HORMONE TREATMENT: Anastrozole   INFECTIONS: NO    FUNCTIONAL TESTS:    QUICK DASH SURVEY: 18.18%                                                                                                                           TREATMENT DATE: 12/25/2023 Standing scapular retraction and shoulder extension yellow x 10, x 5 Supine horizontal abd x 10 yellow Supine alphabet A-Z x 1 Alternating isometrics 2 x 20 sec IR and ER Rhythmic stabs 2 x 20 sec at 90 degrees Serratus punch x 15 no resistance Supine AROM shoulder flexion and scaption to 90 degrees with emphasis on scapular depression Pts LB started to bother her so she sat up and thenwalked around. Discussed  and demonstrated proper way to get up and down from table and showed lumbar roll for sitting posture. Seated in chair with lumbar roll performed STM to left mid deltoid region with cocoa butter to decrease tissue tension.  . 12/19/2023 Pt was late getting started due to having the wrong appt time. She was able to be seen due to cancellation. Discussed POC, impingement and causes of impingement, upper trap compensation, weakness, LOS, treatment interventions. Instructed pt is standing scapular retraction with yellow band x 10, and standing shoulder extension with yellow band x 10. Also performed left UE ER x 7 in limited ROM but did not add to HEP due to discomfort in elbow. Pt advised to discontinue any exercise that causes pain.    PATIENT EDUCATION:  Education  details: Impingement, compensation, SR and shoulder ext yellow band exs, LOS, interventions Person educated: Patient Education method: Explanation, Demonstration, and Handouts Education comprehension: verbalized understanding and returned demonstration  HOME EXERCISE PROGRAM: Scapular retraction and shoulder extension with yellow band x 10 ea to do every other day.  ASSESSMENT:  CLINICAL IMPRESSION: Pt did well with exercises instructed; She had a little difficulty with form for supine horizontal abd, and she had 1 episode of pain with shoulder alphabet. Multiple shortened areas noted in mid deltoid with STM. Pts lateral arm felt much better after treatment.   OBJECTIVE IMPAIRMENTS: decreased activity tolerance, decreased knowledge of condition, decreased ROM, decreased strength, impaired UE functional use, postural dysfunction, and pain.   ACTIVITY LIMITATIONS: lifting, sleeping, bed mobility, and reach over head  PARTICIPATION LIMITATIONS: cleaning and other activities requiring repetitive movements  PERSONAL FACTORS: Age and 3+ comorbidities: Cancer, Diabetes, OA are also affecting patient's functional outcome.   REHAB POTENTIAL: Good  CLINICAL DECISION MAKING: Stable/uncomplicated  EVALUATION COMPLEXITY: Low  GOALS: Goals reviewed with patient? Yes  SHORT TERM GOALS=LONG TERM GOALS: Target date: 01/30/2024  Pt will be independent and compliant with HEP to improve Left shoulder ROM and strength Baseline: Goal status: INITIAL  2.  Pt will have decreased left shoulder pain by 50% or greater Baseline:  Goal status: INITIAL  3.  Pt will have left shoulder abduction 140 degrees  without UT compensation for improved reahcing Baseline:  Goal status: INITIAL  4.  Pt will have left shoulder strength 4-4+/5 without increased pain Baseline:  Goal status: INITIAL  5.  Pt will be able to reach into cabinet repetitively with  lb with 0-minimal discomfort Baseline:  Goal status:  INITIAL  PLAN:  PT FREQUENCY: 1-2x/week  PT DURATION: 6 weeks  PLANNED INTERVENTIONS: 97164- PT Re-evaluation, 97750- Physical Performance Testing, 97110-Therapeutic exercises, 97530- Therapeutic activity, [id number]- Neuromuscular re-education, 97535- Self Care, [address]- Manual therapy, and Patient/Family education  PLAN FOR NEXT SESSION: Review band exs given at evaluation, progress strength/stability and decrease UT compensation, update HEP, PROM.  Latisha Poland, PT 12/25/2023, 3:47 PM

## 2023-12-26 ENCOUNTER — Ambulatory Visit (INDEPENDENT_AMBULATORY_CARE_PROVIDER_SITE_OTHER): Admitting: Podiatry

## 2023-12-26 ENCOUNTER — Encounter: Payer: Self-pay | Admitting: Podiatry

## 2023-12-26 DIAGNOSIS — M79676 Pain in unspecified toe(s): Secondary | ICD-10-CM

## 2023-12-26 DIAGNOSIS — B351 Tinea unguium: Secondary | ICD-10-CM

## 2023-12-26 DIAGNOSIS — N3941 Urge incontinence: Secondary | ICD-10-CM | POA: Diagnosis not present

## 2023-12-26 DIAGNOSIS — E1142 Type 2 diabetes mellitus with diabetic polyneuropathy: Secondary | ICD-10-CM

## 2023-12-26 DIAGNOSIS — L84 Corns and callosities: Secondary | ICD-10-CM | POA: Diagnosis not present

## 2023-12-26 DIAGNOSIS — S91109A Unspecified open wound of unspecified toe(s) without damage to nail, initial encounter: Secondary | ICD-10-CM

## 2023-12-26 DIAGNOSIS — R350 Frequency of micturition: Secondary | ICD-10-CM | POA: Diagnosis not present

## 2023-12-26 MED ORDER — DOXYCYCLINE HYCLATE 100 MG PO CAPS: 100.0000 mg | ORAL_CAPSULE | Freq: Two times a day (BID) | ORAL | 0 refills | Status: AC

## 2023-12-26 MED ORDER — POVIDONE-IODINE 10 % EX SOLN: 1.0000 | CUTANEOUS | 0 refills | Status: DC | PRN

## 2023-12-26 NOTE — Patient Instructions (Signed)
   DRESSING CHANGES LEFT GREAT TOE:   PHARMACY SHOPPING LIST: Saline or Wound Cleanser for cleaning wound 2 x 2 inch sterile gauze for cleaning wound 3. BETADINE SOLUTION  IF PRESCRIBED ORAL ANTIBIOTICS, TAKE ALL MEDICATION AS PRESCRIBED UNTIL ALL ARE GONE.   KEEP LEFT FOOT DRY AT ALL TIMES!!!!  CLEANSE ULCER WITH SALINE OR WOUND CLEANSER.  DAB DRY WITH GAUZE SPONGE.  APPLY A LIGHT AMOUNT OF BETADINE SOLUTION TO TOE.  APPLY OUTER DRESSING AS INSTRUCTED.  WEAR SURGICAL SHOE/BOOT DAILY AT ALL TIMES. IF SUPPLIED, WEAR HEEL PROTECTORS AT ALL TIMES WHEN IN BED.  DO NOT WALK BAREFOOT!!!  IF YOU EXPERIENCE ANY FEVER, CHILLS, NIGHTSWEATS, NAUSEA OR VOMITING, ELEVATED OR LOW BLOOD SUGARS, REPORT TO EMERGENCY ROOM.  IF YOU EXPERIENCE INCREASED REDNESS, PAIN, SWELLING, DISCOLORATION, ODOR, PUS, DRAINAGE OR WARMTH OF YOUR FOOT, REPORT TO EMERGENCY ROOM.

## 2023-12-27 ENCOUNTER — Other Ambulatory Visit

## 2023-12-27 NOTE — Progress Notes (Signed)
 Subjective:  Patient ID: [name], female    DOB: 03/30/1946,  MRN: [id number]  [name] presents to clinic today for at risk foot care with history of diabetic neuropathy and preulcerative lesion(s) left foot, callus(es) right foot which are aggravated when weightbearing with and without shoegear. Pain is relieved with periodic professional debridement. She has picked up her new diabetic shoes and has brought those in today.  New problem(s):         Patient states she has a wound on her left great toe which was the result of her pulling a piece of dry skin from the digit. She did this several days ago. This resulted in the digit bleeding and she had problems trying to stop the bleeding. She did go to Urgent Care and was treated. After she changed the dressing, she had problems with bleeding again and called patient line. She was instructed to apply a pressure dressing. She felt a rubber band would assist in stopping the bleeding.  PCP is Judithann Novas, MD.  Allergies  Allergen Reactions   Dilantin [Phenytoin] Swelling    facial swelling   Latex Hives   Oxycodone  Nausea And Vomiting and Nausea Only    Abdominal Pain, Vomiting   Penicillins Nausea And Vomiting and Swelling    Has patient had a PCN reaction causing immediate rash, facial/tongue/throat swelling, SOB or lightheadedness with hypotension patient had a PCN reaction causing severe rash involving mucus membranes or skin necrosis: [id number]} Has patient had a PCN reaction that required hospitalization/No Has patient had a PCN reaction occurring within the last 10 years: No If all of the above answers are NO, then may proceed with Cephalosporin use.      Codeine  Nausea Only    Tolerates tylenol  with codeine    Hydrocodone  Nausea And Vomiting   Nickel Hives and Rash   Pregabalin Itching and Other (See Comments)    headaches/problem w/vision    Review of Systems: Negative except as noted in the  HPI.  Objective: No changes noted in today's physical examination. There were no vitals filed for this visit. [name] is a pleasant 78 y.o. female in NAD. AAO x 3.  Vascular Examination: Capillary refill time immediate b/l. Vascular status intact b/l with palpable pedal pulses. Pedal hair sparse. No edema. No pain with calf compression b/l. Skin temperature gradient WNL b/l. No cyanosis or clubbing b/l. No ischemia or gangrene noted b/l LE.  Neurological Examination: Sensation grossly intact b/l with 10 gram monofilament. Vibratory sensation intact b/l. Pt has subjective symptoms of neuropathy.  Dermatological Examination:  Open wound skin tear lateral nailbed left great toe with granular base. Digit appears macerated. There is a small area of ecchymosis from rubber band on the medial aspect of the hallux. Digit is not painful per patient. There is still adequate capillary fill time. No odor, no ischemia.  Pedal skin with normal turgor, texture and tone b/l.  No interdigital macerations.   Toenails 2-5 b/l thick, discolored, elongated with subungual debris and pain on dorsal palpation.   Anonychia noted bilateral great toes. Nailbed(s) epithelialized. Hyperkeratotic lesion(s)  5 left foot and medial aspect right great toe. Preulcerative lesions submet head 2 left foot, submet head 5 right foot, sub 5th met base right lower extremity. No erythema, no edema, no drainage, no fluctuance.  Musculoskeletal Examination: Muscle strength 5/5 to all lower extremity muscle groups bilaterally. Plantarflexed metatarsal(s) 5th metatarsal head b/l lower extremities.  Radiographs: None  Assessment/Plan: 1.  Pain due to onychomycosis of toenail   2. Open wound of toe, initial encounter   3. Pre-ulcerative calluses   4. Diabetic polyneuropathy associated with type 2 diabetes mellitus (HCC)     Meds ordered this encounter  Medications   doxycycline  (VIBRAMYCIN ) 100 MG capsule    Sig: Take 1  capsule (100 mg total) by mouth 2 (two) times daily for 10 days.    Dispense:  20 capsule    Refill:  0   povidone-iodine  (BETADINE ) 10 % external solution    Sig: Apply 1 Application topically as needed for wound care.    Dispense:  480 mL    Refill:  0    VAS US  ABI WITH/WO TBI Plan: -Patient was evaluated and treated and all questions answered.  -Ordered ABIs/TBIs to assess healing potential due to patient risk factors. -Patient/POA/Family member educated on diagnosis and treatment plan of left great toe -Wound cleansed with wound cleanser. Betadine  Solution applied to base of ulceration and secured with light dressing. -Discussed the importance of seeking professional help with any issue with her foot. Instructed to avoid using rubber band or other tourniquet devices on digits as she has neuropathy. Patient instructed to avoid using sharp instruments on her feet, pulling skin from her feet, or salon pedicures. Do not apply heat or ice to feet due to neuropathy. She related understanding. -[name] given written instructions on daily wound care for left great toe wound. She has a postop shoe at home and was instructed to wear this. Instructed to report to ED with worsening of toe/foot. She related understanding. -Rx for Doxycyline 100 mg, #20, to be taken one capsule twice daily for 10 days. Rx for Betadine  Solution.  -Mycotic toenails 2-5 bilaterally and right great toe were debrided in length and girth with sterile nail nippers and dremel without iatrogenic bleeding. -Callus(es) right great toe and submet head 5 left foot pared utilizing sterile scalpel blade without complication or incident. Total number debrided =2. -Preulcerative lesion pared submet head 2 left foot, submet head 5 right foot, and sub 5th met base right foot utilizing sterile scalpel blade. Total number pared=3. -Patient/POA to call should there be question/concern in the interim.  Return 7-10 days with Dr.  Michalene Agee for follow up of left great toe. Follow up 9 weeks with me.  [name], DPM      [facility] LOCATION: [address] [place] [address]                   Office [phone]   [facility] LOCATION: [address] [place], [place] [address] Office [phone]

## 2023-12-30 ENCOUNTER — Other Ambulatory Visit: Payer: Self-pay | Admitting: Hematology and Oncology

## 2024-01-01 ENCOUNTER — Ambulatory Visit

## 2024-01-03 ENCOUNTER — Other Ambulatory Visit

## 2024-01-06 ENCOUNTER — Ambulatory Visit (INDEPENDENT_AMBULATORY_CARE_PROVIDER_SITE_OTHER)

## 2024-01-06 ENCOUNTER — Other Ambulatory Visit: Payer: Self-pay | Admitting: Cardiovascular Disease

## 2024-01-06 ENCOUNTER — Ambulatory Visit (INDEPENDENT_AMBULATORY_CARE_PROVIDER_SITE_OTHER): Admitting: Podiatry

## 2024-01-06 ENCOUNTER — Encounter: Payer: Self-pay | Admitting: Podiatry

## 2024-01-06 VITALS — BP 140/73 | HR 72 | Temp 99.3°F

## 2024-01-06 DIAGNOSIS — M1712 Unilateral primary osteoarthritis, left knee: Secondary | ICD-10-CM | POA: Diagnosis not present

## 2024-01-06 DIAGNOSIS — L98491 Non-pressure chronic ulcer of skin of other sites limited to breakdown of skin: Secondary | ICD-10-CM

## 2024-01-06 DIAGNOSIS — S91109A Unspecified open wound of unspecified toe(s) without damage to nail, initial encounter: Secondary | ICD-10-CM | POA: Diagnosis not present

## 2024-01-06 NOTE — Progress Notes (Signed)
  Subjective:  Patient ID: [name], female    DOB: 1945-09-12,  MRN: [id number]  Chief Complaint  Patient presents with   Diabetic Ulcer    I pulled some skin off my little toe.    78 y.o. female presents with the above complaint. History confirmed with patient.  This happened a couple weeks ago seem to be healing well she has not had any drainage, she had some bleeding from her callus and wrapped a band tightly around it to stop bleeding due to her blood thinner  Objective:  Physical Exam: warm, good capillary refill, no trophic changes or ulcerative lesions, normal DP and PT pulses, and left hallux distal tip callus, healed blood blister around the base of the toe   Radiographs: Multiple views x-ray of the left foot: no fracture, dislocation, swelling or degenerative changes noted Assessment:   1. Skin ulcer, limited to breakdown of skin (HCC)      Plan:  Patient was evaluated and treated and all questions answered.  Skin ulcer and blood blister has healed uneventfully may leave open to air and apply moisturizing lotion as needed.  Return to see me as needed.  Return if symptoms worsen or fail to improve.

## 2024-01-07 ENCOUNTER — Encounter: Payer: Self-pay | Admitting: Allergy and Immunology

## 2024-01-07 ENCOUNTER — Other Ambulatory Visit: Payer: Self-pay

## 2024-01-07 ENCOUNTER — Ambulatory Visit (INDEPENDENT_AMBULATORY_CARE_PROVIDER_SITE_OTHER): Admitting: Allergy and Immunology

## 2024-01-07 VITALS — BP 122/64 | HR 80 | Temp 97.7°F | Wt 193.0 lb

## 2024-01-07 DIAGNOSIS — J3089 Other allergic rhinitis: Secondary | ICD-10-CM

## 2024-01-07 DIAGNOSIS — J4550 Severe persistent asthma, uncomplicated: Secondary | ICD-10-CM | POA: Diagnosis not present

## 2024-01-07 DIAGNOSIS — K219 Gastro-esophageal reflux disease without esophagitis: Secondary | ICD-10-CM | POA: Diagnosis not present

## 2024-01-07 DIAGNOSIS — Z91018 Allergy to other foods: Secondary | ICD-10-CM

## 2024-01-07 MED ORDER — FLUTICASONE PROPIONATE HFA 220 MCG/ACT IN AERO: 2.0000 | INHALATION_SPRAY | Freq: Four times a day (QID) | RESPIRATORY_TRACT | 1 refills | Status: DC | PRN

## 2024-01-07 MED ORDER — ADVAIR HFA 230-21 MCG/ACT IN AERO: 2.0000 | INHALATION_SPRAY | Freq: Two times a day (BID) | RESPIRATORY_TRACT | 5 refills | Status: DC

## 2024-01-07 MED ORDER — FAMOTIDINE 40 MG PO TABS: 40.0000 mg | ORAL_TABLET | Freq: Every evening | ORAL | 1 refills | Status: DC

## 2024-01-07 MED ORDER — ALBUTEROL SULFATE HFA 108 (90 BASE) MCG/ACT IN AERS: 2.0000 | INHALATION_SPRAY | Freq: Four times a day (QID) | RESPIRATORY_TRACT | 1 refills | Status: DC | PRN

## 2024-01-07 MED ORDER — ALBUTEROL SULFATE (2.5 MG/3ML) 0.083% IN NEBU: 2.5000 mg | INHALATION_SOLUTION | Freq: Four times a day (QID) | RESPIRATORY_TRACT | 1 refills | Status: DC | PRN

## 2024-01-07 MED ORDER — PANTOPRAZOLE SODIUM 40 MG PO TBEC: 40.0000 mg | DELAYED_RELEASE_TABLET | Freq: Two times a day (BID) | ORAL | 1 refills | Status: DC

## 2024-01-07 MED ORDER — EPINEPHRINE 0.3 MG/0.3ML IJ SOAJ: 0.3000 mg | INTRAMUSCULAR | 1 refills | Status: DC | PRN

## 2024-01-07 NOTE — Progress Notes (Unsigned)
 [place] - [place] - [place] - [place] - [place]   Follow-up Note  Referring Provider: Avelina Greig BRAVO, MD Primary Provider: Avelina Greig BRAVO, MD Date of Office Visit: 01/07/2024  Subjective:   [name] (DOB: [birth year]) is a 78 y.o. female who [facility] in re-evaluation of the following:  HPI: Taquana returns to this clinic in evaluation of asthma, history of pulmonary hypertension/diastolic dysfunction, allergic rhinitis, LPR, esophageal dysfunction, and food allergy directed against shellfish and fish.  I last sawher  in this clinic 24 September 2023.  During her last visit we had her obtain a modified barium swallow which identified pharyngeal dysfunction and she underwent speech therapy with exercises and she is much better regarding her throat clearing and the mucus stuck in her throat and her intermittent raspy voice.  She does have an appointment with ENT 12 January 2024.  She has had very little problems with her asthma.  She does have a throat clearing like cough but no wheezing and does not feel short of breath and rarely uses a short acting bronchodilator while she continues on anti-TSLP antibody and combination inhaler and she has had very little issue with her upper airway as well.  Classic reflux symptoms are under good control with her current plan.  During her last visit we increased her pantoprazole  to twice a day and she continues on famotidine  in the evening.  She does not consume fish or shellfish.  Allergies as of 01/07/2024       Reactions   Dilantin [phenytoin] Swelling   facial swelling   Latex Hives   Oxycodone  Nausea And Vomiting, Nausea Only   Abdominal Pain, Vomiting   Penicillins Nausea And Vomiting, Swelling   Has patient had a PCN reaction causing immediate rash, facial/tongue/throat swelling, SOB or lightheadedness with hypotension patient had a PCN reaction causing severe rash involving mucus  membranes or skin necrosis: [id number] Has patient had a PCN reaction that required hospitalization/No Has patient had a PCN reaction occurring within the last 10 years: No If all of the above answers are NO, then may proceed with Cephalosporin use.   Codeine  Nausea Only   Tolerates tylenol  with codeine    Hydrocodone  Nausea And Vomiting   Nickel Hives, Rash   Pregabalin Itching, Other (See Comments)   headaches/problem w/vision        Medication List    ABSORBINE PLUS JR EX Apply 1 patch topically daily as needed (pain).   Advair  HFA 230-21 MCG/ACT inhaler Generic drug: fluticasone -salmeterol Inhale 2 puffs into the lungs 2 (two) times daily. 2 puffs 1-2 times daily depending on disease activity.   albuterol  108 (90 Base) MCG/ACT inhaler Commonly known as: VENTOLIN  HFA INHALE 2 PUFFS BY MOUTH EVERY 6 HOURS AS NEEDED FOR WHEEZING OR SHORTNESS OF BREATH   albuterol  (2.5 MG/3ML) 0.083% nebulizer solution Commonly known as: PROVENTIL  Take 3 mLs (2.5 mg total) by nebulization every 6 (six) hours as needed for wheezing or shortness of breath.   anastrozole  1 MG tablet Commonly known as: ARIMIDEX  TAKE 1 TABLET(1 MG) BY MOUTH DAILY   atorvastatin  10 MG tablet Commonly known as: LIPITOR TAKE 1 TABLET(10 MG) BY MOUTH DAILY   BIOTENE DRY MOUTH MT Use as directed 1 Dose in the mouth or throat daily as needed (dry mouth).   budesonide  0.5 MG/2ML nebulizer solution Commonly known as: Pulmicort  Take 2 mLs (0.5 mg total) by nebulization in the morning and at bedtime.  cyclobenzaprine  10 MG tablet Commonly known as: FLEXERIL  Take 0.5-1 tablets (5-10 mg total) by mouth at bedtime as needed for muscle spasms.   DAIRY-RELIEF PO Take 1 capsule by mouth daily as needed (eating dairy).   desloratadine  5 MG tablet Commonly known as: CLARINEX  TAKE 1 TABLET(5 MG) BY MOUTH DAILY   EPINEPHrine  0.3 mg/0.3 mL Soaj injection Commonly known as: EPI-PEN Inject 0.3 mg into the muscle  as needed for anaphylaxis. USE AS DIRECTED FOR LIFE THREATENING ALLERGIC REACTIONS   estradiol  0.1 MG/GM vaginal cream Commonly known as: ESTRACE  Place 1 Applicatorful vaginally 2 (two) times a week. (As Needed)   famotidine  40 MG tablet Commonly known as: PEPCID  Take 1 tablet (40 mg total) by mouth at bedtime. Take 1 (ONE) tablet by mouth every evening.   fluconazole  150 MG tablet Commonly known as: DIFLUCAN  Take one pill by mouth now and repeat dose in 3 days   fluticasone  220 MCG/ACT inhaler Commonly known as: Flovent  HFA Inhale 2 puffs into the lungs 2 (two) times daily. 2 puffs 2 times daily during flare up.   gabapentin  300 MG capsule Commonly known as: NEURONTIN  Take one capsule in the morning, one at lunch and two at bedtime   Gemtesa  75 MG Tabs Generic drug: Vibegron  1 tablet daily   Glyxambi  10-5 MG Tabs Generic drug: Empagliflozin -linaGLIPtin  TAKE 1 TABLET BY MOUTH DAILY   hydrOXYzine  10 MG tablet Commonly known as: ATARAX  Take 1 tablet (10 mg total) by mouth daily as needed for itching.   ipratropium 0.06 % nasal spray Commonly known as: ATROVENT  Place 2 sprays into both nostrils 2 (two) times daily as needed for rhinitis.   irbesartan  150 MG tablet Commonly known as: AVAPRO  TAKE 1 TABLET(150 MG) BY MOUTH DAILY   Magnesium 500 MG Tabs Take 500 mg by mouth 2 (two) times daily.   methimazole  5 MG tablet Commonly known as: TAPAZOLE  Take 1 tablet (5 mg total) by mouth daily.   montelukast  10 MG tablet Commonly known as: SINGULAIR  Take 1 tablet (10 mg total) by mouth at bedtime. For asthma control.   Nebulizer Mask Adult Misc 1 kit by Does not apply route as directed.   Nebulizer Misc 1 Device by Does not apply route as directed.   ondansetron  4 MG disintegrating tablet Commonly known as: ZOFRAN -ODT Take 1 tablet (4 mg total) by mouth every 8 (eight) hours as needed for nausea or vomiting.   OneTouch Delica Plus Lancet33G Misc USE TO CHECK BLOOD  SUGAR UP TO TWICE DAILY   OneTouch Verio test strip Generic drug: glucose blood 1 strip 2 (two) times daily   OneTouch Verio w/Device Kit Use to check blood sugar up to 2 times a day   pantoprazole  40 MG tablet Commonly known as: Protonix  Take 1 tablet (40 mg total) by mouth 2 (two) times daily.   polyethylene glycol powder 17 GM/SCOOP powder Commonly known as: GLYCOLAX /MIRALAX  Take 1 Container by mouth once.   povidone-iodine  10 % external solution Commonly known as: Betadine  Apply 1 Application topically as needed for wound care.   PRESCRIPTION MEDICATION Inhale into the lungs at bedtime. CPAP   rivaroxaban  20 MG Tabs tablet Commonly known as: Xarelto  Take 1 tablet (20 mg total) by mouth daily with supper.   SALONPAS JET SPRAY EX Apply 1 spray topically daily as needed (knee pain).   SYSTANE OP Place 1 drop into both eyes daily as needed (dry eyes).   triamcinolone  55 MCG/ACT Aero nasal inhaler Commonly known as: NASACORT   Place 2 sprays into the nose daily.   Trulance 3 MG Tabs Generic drug: Plecanatide Take 3 mg by mouth daily as needed (constipation).    Past Medical History:  Diagnosis Date   Allergic rhinitis    Allergy    Arthritis    Asthma    Breast cancer (HCC)    Chronic headache    Diabetes mellitus    Ductal carcinoma in situ (DCIS) of left breast 02/23/2022   DVT (deep venous thrombosis) (HCC)    Dyspnea    Heart murmur    History of radiation therapy    Left Breast 05/02/22-05/30/22- Dr. Lynwood Nasuti   Hypertension    Penicillin allergy 01/19/2020   Pulmonary embolism (HCC)    Pulmonary hypertension (HCC)    Seizures (HCC)     Past Surgical History:  Procedure Laterality Date   ABDOMINAL HYSTERECTOMY     partial, has ovaries   BREAST LUMPECTOMY WITH RADIOACTIVE SEED LOCALIZATION Left 03/29/2022   Procedure: LEFT BREAST LUMPECTOMY WITH RADIOACTIVE SEED LOCALIZATION;  Surgeon: Vanderbilt Ned, MD;  Location: [facility] OR;  Service: General;   Laterality: Left;   CARDIAC CATHETERIZATION  12/28/2010   Mod. pulmonary hypertension, normal coronary arteries   CARDIOVERSION N/A 11/23/2020   Procedure: CARDIOVERSION;  Surgeon: Francyne Headland, MD;  Location: [facility];  Service: Cardiovascular;  Laterality: N/A;   CHOLECYSTECTOMY N/A 12/20/2021   Procedure: LAPAROSCOPIC CHOLECYSTECTOMY WITH INTRAOPERATIVE CHOLANGIOGRAM;  Surgeon: Eletha Boas, MD;  Location: [facility] ORS;  Service: General;  Laterality: N/A;   DOPPLER ECHOCARDIOGRAPHY  10/08/2011   EF=>55%,mild asymmetric LVH, mod. TR, mod. PH, mild to mod LA dilatation   ESOPHAGEAL MANOMETRY N/A 06/19/2023   Procedure: ESOPHAGEAL MANOMETRY (EM);  Surgeon: Saintclair Jasper, MD;  Location: [facility];  Service: Gastroenterology;  Laterality: N/A;   IR LUMBAR DISC ASPIRATION W/IMG GUIDE  01/12/2020   KNEE ARTHROSCOPY Left    KNEE SURGERY     Nuclear Stress Test  05/20/2006   No ischemia   PARTIAL HYSTERECTOMY     PLANTAR FASCIA SURGERY     TONSILLECTOMY      Review of systems negative except as noted in HPI / PMHx or noted below:  Review of Systems  Constitutional: Negative.   HENT: Negative.    Eyes: Negative.   Respiratory: Negative.    Cardiovascular: Negative.   Gastrointestinal: Negative.   Genitourinary: Negative.   Musculoskeletal: Negative.   Skin: Negative.   Neurological: Negative.   Endo/Heme/Allergies: Negative.   Psychiatric/Behavioral: Negative.       Objective:   Vitals:   01/07/24 1157  BP: 122/64  Pulse: 80  Temp: 97.7 F (36.5 C)  SpO2: 98%      Weight: 193 lb (87.5 kg)   Physical Exam Constitutional:      Appearance: She is not diaphoretic.  HENT:     Head: Normocephalic.     Right Ear: Tympanic membrane, ear canal and external ear normal.     Left Ear: Tympanic membrane, ear canal and external ear normal.     Nose: Nose normal. No mucosal edema or rhinorrhea.     Mouth/Throat:     Pharynx: Uvula midline. No oropharyngeal exudate.   Eyes:      Conjunctiva/sclera: Conjunctivae normal.   Neck:     Thyroid : No thyromegaly.     Trachea: Trachea normal. No tracheal tenderness or tracheal deviation.   Cardiovascular:     Rate and Rhythm: Normal rate and regular rhythm.     Heart sounds: S1  normal and S2 normal. Murmur (systolic) heard.  Pulmonary:     Effort: No respiratory distress.     Breath sounds: Normal breath sounds. No stridor. No wheezing or rales.  Lymphadenopathy:     Head:     Right side of head: No tonsillar adenopathy.     Left side of head: No tonsillar adenopathy.     Cervical: No cervical adenopathy.   Skin:    Findings: No erythema or rash.     Nails: There is no clubbing.   Neurological:     Mental Status: She is alert.     Diagnostics:    Spirometry was performed and demonstrated an FEV1 of 1.48 at 87 % of predicted.  Results of modified barium swallow obtained 03 October 2023 identifies the following:  Clinical Impression: Patient presents with mild pharyngeal dysphagia mostly characterized by inadequate laryngeal elevation and closure resulting in minimal trace aspiration of thin liquids on an inconsistent basis. Chin tuck posture not helpful as it allowed barium to spill into open larynx during the swallow. Using thin liquid to wash down food worsened penetration and aspiration. Though patient does not sense trace aspiration cued cough is effective to help her to clear. Pharyngeal swallow is strong fortunately. Trace retention with liquids at vallecula and piriform sinus likely due to pressure issue from known esophageal deficits.   Assessment and Plan:   1. Asthma, severe persistent, well-controlled   2. Other allergic rhinitis   3. LPRD (laryngopharyngeal reflux disease)   4. Food allergy    1. Continue to treat and prevent airway inflammation:  A.  Advair  230 - 2 inhalations 2 times a day (empty lungs)  B.  Montelukast  10mg  - 1 tablet 1 time per day.  C.  Tezepelumab  injections.    2.  Continue to treat reflux / swallowing issue:  A. Pantoprazole  40mg  - 2 times per day B. Famotidine  40 mg in PM C. Replace throat clearing with swallowing / drinking maneuver D. Evaluation of throat with ENT - 12 January 2024  3. If needed:   A. Nasal saline several times per day  B. Albuterol  + Fluticasone  220 - 2 inhalations TOGETHER every 6 hours  C. Epi-pen    4. Return to clinic in 12 weeks or earlier if there is a problem.  5. Influenza = Tamiflu. Covid = molnupiravir  Hava is doing pretty well regarding her airway issue on her current plan of utilizing anti-TSLP antibody and other anti-inflammatory agents for her airway and addressing her LPR with aggressive plan of proton pump inhibitor twice a day and a H2 receptor blocker along with undergoing some speech therapy for her pharyngeal dysfunction.  I think it would be worthwhile to have her throat evaluated by ENT to make sure were not dealing with some other significant abnormality contributing to her throat problems.  I will see her back in this clinic in 12 weeks or earlier if there is a problem.   Camellia Denis, MD Allergy / Immunology [facility]

## 2024-01-07 NOTE — Patient Instructions (Addendum)
  1. Continue to treat and prevent airway inflammation:  A.  Advair  230 - 2 inhalations 2 times a day (empty lungs)  B.  Montelukast  10mg  - 1 tablet 1 time per day.  C.  Tezepelumab  injections.    2. Continue to treat reflux / swallowing issue:  A. Pantoprazole  40mg  - 2 times per day B. Famotidine  40 mg in PM C. Replace throat clearing with swallowing / drinking maneuver D. Evaluation of throat with ENT - 12 January 2024  3. If needed:   A. Nasal saline several times per day  B. Albuterol  + Fluticasone  220 - 2 inhalations TOGETHER every 6 hours  C. Epi-pen    4. Return to clinic in 12 weeks or earlier if there is a problem.  5. Influenza = Tamiflu. Covid = molnupiravir

## 2024-01-08 ENCOUNTER — Encounter: Payer: Self-pay | Admitting: Allergy and Immunology

## 2024-01-08 ENCOUNTER — Encounter

## 2024-01-09 ENCOUNTER — Encounter: Payer: Self-pay | Admitting: Allergy and Immunology

## 2024-01-10 ENCOUNTER — Telehealth: Payer: Self-pay

## 2024-01-10 ENCOUNTER — Other Ambulatory Visit (HOSPITAL_COMMUNITY): Payer: Self-pay

## 2024-01-10 NOTE — Telephone Encounter (Signed)
 Pharmacy Patient Advocate Encounter   Received notification from Fax that prior authorization for Albuterol  Neb Solution is required/requested.   Insurance verification completed.   The patient is insured through [employer] .   Per test claim: Medication is not eligible for pharmacy benefits and must be billed through medical insurance. As our team only handles pharmacy related prior auths, medical PA's must be submitted by the clinic. Thank you  *Called pt pharmacy and l/m to bill to Medicare Part B

## 2024-01-14 ENCOUNTER — Ambulatory Visit

## 2024-01-14 DIAGNOSIS — J4550 Severe persistent asthma, uncomplicated: Secondary | ICD-10-CM

## 2024-01-15 ENCOUNTER — Ambulatory Visit: Attending: Hematology and Oncology

## 2024-01-15 DIAGNOSIS — G8929 Other chronic pain: Secondary | ICD-10-CM | POA: Diagnosis not present

## 2024-01-15 DIAGNOSIS — D0512 Intraductal carcinoma in situ of left breast: Secondary | ICD-10-CM | POA: Diagnosis not present

## 2024-01-15 DIAGNOSIS — M7542 Impingement syndrome of left shoulder: Secondary | ICD-10-CM | POA: Insufficient documentation

## 2024-01-15 DIAGNOSIS — M25512 Pain in left shoulder: Secondary | ICD-10-CM | POA: Insufficient documentation

## 2024-01-15 NOTE — Therapy (Signed)
 OUTPATIENT PHYSICAL THERAPY  UPPER EXTREMITY ONCOLOGY TREATMENT  Patient Name: [name] MRN: [id number] DOB:[birth year], 78 y.o., female Today's Date: 01/15/2024  END OF SESSION:  PT End of Session - 01/15/24 1402     Visit Number 3    Number of Visits 12    Date for PT Re-Evaluation 01/30/24    PT Start Time 1403    PT Stop Time 1455    PT Time Calculation (min) 52 min    Activity Tolerance Patient tolerated treatment well    Behavior During Therapy [facility] for tasks assessed/performed          Past Medical History:  Diagnosis Date   Allergic rhinitis    Allergy    Arthritis    Asthma    Breast cancer (HCC)    Chronic headache    Diabetes mellitus    Ductal carcinoma in situ (DCIS) of left breast 02/23/2022   DVT (deep venous thrombosis) (HCC)    Dyspnea    Heart murmur    History of radiation therapy    Left Breast 05/02/22-05/30/22- Dr. Lynwood Nasuti   Hypertension    Penicillin allergy 01/19/2020   Pulmonary embolism (HCC)    Pulmonary hypertension (HCC)    Seizures (HCC)    Past Surgical History:  Procedure Laterality Date   ABDOMINAL HYSTERECTOMY     partial, has ovaries   BREAST LUMPECTOMY WITH RADIOACTIVE SEED LOCALIZATION Left 03/29/2022   Procedure: LEFT BREAST LUMPECTOMY WITH RADIOACTIVE SEED LOCALIZATION;  Surgeon: Vanderbilt Ned, MD;  Location: [facility] OR;  Service: General;  Laterality: Left;   CARDIAC CATHETERIZATION  12/28/2010   Mod. pulmonary hypertension, normal coronary arteries   CARDIOVERSION N/A 11/23/2020   Procedure: CARDIOVERSION;  Surgeon: Francyne Headland, MD;  Location: [facility];  Service: Cardiovascular;  Laterality: N/A;   CHOLECYSTECTOMY N/A 12/20/2021   Procedure: LAPAROSCOPIC CHOLECYSTECTOMY WITH INTRAOPERATIVE CHOLANGIOGRAM;  Surgeon: Eletha Boas, MD;  Location: [facility];  Service: General;  Laterality: N/A;   DOPPLER ECHOCARDIOGRAPHY  10/08/2011   EF=>55%,mild asymmetric LVH, mod. TR, mod. PH, mild to mod LA dilatation   ESOPHAGEAL  MANOMETRY N/A 06/19/2023   Procedure: ESOPHAGEAL MANOMETRY (EM);  Surgeon: Saintclair Jasper, MD;  Location: [facility];  Service: Gastroenterology;  Laterality: N/A;   IR LUMBAR DISC ASPIRATION W/IMG GUIDE  01/12/2020   KNEE ARTHROSCOPY Left    KNEE SURGERY     Nuclear Stress Test  05/20/2006   No ischemia   PARTIAL HYSTERECTOMY     PLANTAR FASCIA SURGERY     TONSILLECTOMY     Patient Active Problem List   Diagnosis Date Noted   Right foot pain 11/27/2023   Headache 11/22/2023   Acute insomnia 09/26/2023   Sprain of anterior talofibular ligament of right ankle 07/02/2022   SSS (sick sinus syndrome) (HCC) 04/26/2022   Family history of breast cancer 02/28/2022   Family history of colon cancer in mother 02/28/2022   Ductal carcinoma in situ (DCIS) of left breast 02/23/2022   Family history of malignant neoplasm of digestive organs 02/02/2022   Anemia 12/29/2021   Chronic constipation 12/29/2021   Chronic anticoagulation - Xerelto 12/18/2021   Hyperthyroidism 12/18/2021   Status post total left knee replacement 12/09/2021   Presence of right artificial knee joint 10/30/2021   Constipation by delayed colonic transit 09/25/2021   Atypical atrial flutter (HCC) 06/24/2021   Chronic diastolic heart failure (HCC) 06/24/2021   Type 2 diabetes mellitus with diabetic neuropathy, unspecified (HCC) 06/20/2020   Dysphonia 04/08/2020  Discitis of lumbosacral region 01/04/2020   Hyperlipidemia associated with type 2 diabetes mellitus (HCC) 05/07/2019   Dizziness 05/07/2019   Obstructive sleep apnea treated with continuous positive airway pressure (CPAP) 06/03/2018   Left thyroid  nodule 02/06/2018   Insomnia secondary to chronic pain 01/26/2018   Class 3 drug-induced obesity with serious comorbidity and body mass index (BMI) of 40.0 to 44.9 in adult (HCC) 12/03/2017   Graves disease 10/15/2017   Bilateral high frequency sensorineural hearing loss 10/24/2016   Subjective tinnitus, bilateral  10/24/2016   Moderate persistent asthma 07/12/2015   Paroxysmal atrial fibrillation (HCC) 06/03/2015   Neuropathy due to secondary diabetes (HCC) 01/11/2015   Migraine headache without aura 08/24/2014   Allergic sinusitis 03/25/2014   DVT (deep venous thrombosis) hx of  03/25/2014   Osteoarthritis of right knee 03/25/2014    ? of Seizure disorder 03/25/2014   Depression, major, in remission (HCC) 03/25/2014   Hypertension associated with diabetes (HCC) 03/25/2014   GERD (gastroesophageal reflux disease) 03/25/2014   History of DVT (deep vein thrombosis) 03/25/2014   History of pulmonary embolism 10/09/2013   Asthma, moderate persistent 01/24/2011   Pulmonary hypertension (HCC) 01/11/2011    PCP:   REFERRING PROVIDER: Mackey Chad, MD  REFERRING DIAG: s/p Left Lumpectomy with DCIS/joint tightness  THERAPY DIAG:  Ductal carcinoma in situ of left breast  Chronic left shoulder pain  Shoulder impingement syndrome, left  ONSET DATE: Off and on since Oct 5,2024 after a fall  Rationale for Evaluation and Treatment: Rehabilitation  SUBJECTIVE:                                                                                                                                                                                           SUBJECTIVE STATEMENT:  My shoulder has been hurting on and off. My daughter was here last week. My back and knee have been hurting and they did a shot in my knee. I have been doing the band exercises when my shoulder doesn't hurt.      EVAL Pt is having tightness and pain in her left shoulder after she does things. She uses ice at times. Sometimes that helps.It bothers her to lift heavy things  or to put things in and out of the cabinet repetiively. She used to work for the [employer] and injured her arm with lifting when she felt a pop in her shoulder. She has had pain off and on since then. This onset approx Oct 2024. Pt is presently having rehab for SLP  at [facility].  PERTINENT HISTORY:  PT is s/p Left lumpectomy for DCIS on 03/29/2022.  She had radiation which ended on 05/30/2022. She is presently taking Anastrozole , Right TKA,Prior DVT, PE, asthma, hypertension, Type II diabetes with neuropathy PAIN:  Are you having pain? No not at rest NPRS scale: 6-7/10 with movement. Pain location: left shoulder Pain orientation: Left  PAIN TYPE: aching and sharp Pain description: intermittent  Aggravating factors: lifting, reaching repetitively, sometimes sleeping on that side, Relieving factors: sometimes ice, sometimes heat  PRECAUTIONS: Prior DVT, PE, asthma, hypertension, Type II diabetes with neuropathy  RED FLAGS: None   WEIGHT BEARING RESTRICTIONS: No  FALLS:  Has patient fallen in last 6 months? No  LIVING ENVIRONMENT: Lives with: lives alone Lives in: House/apartment Stairs: Yes; External: 3 steps; can reach both  OCCUPATION: retired from the post office  LEISURE: likes to go to senior center, chair yoga,  HAND DOMINANCE: bilateral   PRIOR LEVEL OF FUNCTION: Independent  PATIENT GOALS: I want to feel better,  improve ROM and strength on the left   OBJECTIVE: Note: Objective measures were completed at Evaluation unless otherwise noted.  COGNITION: Overall cognitive status: Within functional limits for tasks assessed   PALPATION: Tender left UT,and posterior left shoulder  OBSERVATIONS / OTHER ASSESSMENTS: UT compensation left greater than right with overhead reaching, + Left impingement test,  SENSATION: Light touch:    POSTURE: forward head, rounded shoulders  UPPER EXTREMITY AROM/PROM:  A/PROM RIGHT   eval   Shoulder extension 65  Shoulder flexion 147  Shoulder abduction 152  Shoulder internal rotation 60  Shoulder external rotation 93    (Blank rows = not tested)  A/PROM LEFT   eval  Shoulder extension 60  Shoulder flexion 140, UT compensation,+ shoulder  Shoulder abduction 120, UT  compensation, + shoulder  Shoulder internal rotation 44+  Shoulder external rotation 77    (Blank rows = not tested)  CERVICAL AROM: All within fxl limits:     UPPER EXTREMITY STRENGTH: Strength 5/5 IR, 4/5 ER with pain, Flex 3+/5 pain, Abd 3-/5  LYMPHEDEMA ASSESSMENTS:   SURGERY TYPE/DATE: Left Lumpectomy  NUMBER OF LYMPH NODES REMOVED: 0  CHEMOTHERAPY: No  RADIATION:Yes, ended 05/30/2022  HORMONE TREATMENT: Anastrozole   INFECTIONS: NO    FUNCTIONAL TESTS:    QUICK DASH SURVEY: 18.18%                                                                                                                           TREATMENT DATE:  01/15/2024 Standing scapular retraction and shoulder extension yellow x 10, x 5 Bilateral ER to neutral with yellow x 10 Supine horizontal abd yellow x 10 Ball rolls on wall x 10 4 D Shoulder ranger on left x 10 flexion Tried serratus wall push up, but held due to poor form Supine shoulder flexion x 10, scaption x 5 Supine alphabet A-Z x 1 Alternating isometrics IR/ER x 30 sec Updated HEP  12/25/2023 Standing scapular retraction and shoulder extension yellow x 10, x 5 Supine horizontal abd x 10 yellow Supine  alphabet A-Z x 1 Alternating isometrics 2 x 20 sec IR and ER Rhythmic stabs 2 x 20 sec at 90 degrees Serratus punch x 15 no resistance Supine AROM shoulder flexion and scaption to 90 degrees with emphasis on scapular depression Pts LB started to bother her so she sat up and thenwalked around. Discussed and demonstrated proper way to get up and down from table and showed lumbar roll for sitting posture. Seated in chair with lumbar roll performed STM to left mid deltoid region with cocoa butter to decrease tissue tension.  . 12/19/2023 Pt was late getting started due to having the wrong appt time. She was able to be seen due to cancellation. Discussed POC, impingement and causes of impingement, upper trap compensation, weakness, LOS, treatment  interventions. Instructed pt is standing scapular retraction with yellow band x 10, and standing shoulder extension with yellow band x 10. Also performed left UE ER x 7 in limited ROM but did not add to HEP due to discomfort in elbow. Pt advised to discontinue any exercise that causes pain.    PATIENT EDUCATION Access Code: [email or web address] URL: [email or web address] Date: 01/15/2024 Prepared by: Grayce Sheldon  Exercises - Shoulder External Rotation and Scapular Retraction with Resistance  - 1 x daily - 3 x weekly - 1 sets - 10 reps - Standing Wall Ball Circles with Mini Swiss Ball  - 1 x daily - 7 x weekly - 1 sets - 10 reps - Supine Shoulder Alphabet  - 1 x daily - 7 x weekly - 2 sets - 1 reps - Supine Single Arm Shoulder Protraction  - 1 x daily - 7 x weekly - 1 sets - 10 reps :  Education details: Impingement, compensation, SR and shoulder ext yellow band exs, LOS, interventions Person educated: Patient Education method: Explanation, Demonstration, and Handouts Education comprehension: verbalized understanding and returned demonstration  HOME EXERCISE PROGRAM: Scapular retraction and shoulder extension with yellow band x 10 ea to do every other day.  ASSESSMENT:  CLINICAL IMPRESSION: Pt did well with exercises instructed; Did ER with band to tension only (limited ROM), but did well with other exs except wal serratus which we discontinued due to difficulty performing correctly. Pts shoulder felt looser and better after rx. She requires VC's and TC's to depress scapula.    OBJECTIVE IMPAIRMENTS: decreased activity tolerance, decreased knowledge of condition, decreased ROM, decreased strength, impaired UE functional use, postural dysfunction, and pain.   ACTIVITY LIMITATIONS: lifting, sleeping, bed mobility, and reach over head  PARTICIPATION LIMITATIONS: cleaning and other activities requiring repetitive movements  PERSONAL FACTORS: Age and 3+ comorbidities: Cancer,  Diabetes, OA are also affecting patient's functional outcome.   REHAB POTENTIAL: Good  CLINICAL DECISION MAKING: Stable/uncomplicated  EVALUATION COMPLEXITY: Low  GOALS: Goals reviewed with patient? Yes  SHORT TERM GOALS=LONG TERM GOALS: Target date: 01/30/2024  Pt will be independent and compliant with HEP to improve Left shoulder ROM and strength Baseline: Goal status: INITIAL  2.  Pt will have decreased left shoulder pain by 50% or greater Baseline:  Goal status: INITIAL  3.  Pt will have left shoulder abduction 140 degrees  without UT compensation for improved reahcing Baseline:  Goal status: INITIAL  4.  Pt will have left shoulder strength 4-4+/5 without increased pain Baseline:  Goal status: INITIAL  5.  Pt will be able to reach into cabinet repetitively with  0.5lb with 0-minimal discomfort Baseline:  Goal status: INITIAL  PLAN:  PT FREQUENCY: 1-2x/week  PT DURATION: 6 weeks  PLANNED INTERVENTIONS: [id number]- PT Re-evaluation, 97750- Physical Performance Testing, 97110-Therapeutic exercises, 97530- Therapeutic activity, [id number]- Neuromuscular re-education, 97535- Self Care, [id number]- Manual therapy, and Patient/Family education  PLAN FOR NEXT SESSION: Review band exs given at evaluation, progress strength/stability and decrease UT compensation, update HEP, PROM.  Grayce JINNY Sheldon, PT 01/15/2024, 2:58 PM

## 2024-01-16 ENCOUNTER — Encounter (INDEPENDENT_AMBULATORY_CARE_PROVIDER_SITE_OTHER)

## 2024-01-16 ENCOUNTER — Other Ambulatory Visit: Payer: Self-pay | Admitting: Podiatry

## 2024-01-19 ENCOUNTER — Encounter: Payer: Self-pay | Admitting: Family Medicine

## 2024-01-20 ENCOUNTER — Ambulatory Visit (INDEPENDENT_AMBULATORY_CARE_PROVIDER_SITE_OTHER)

## 2024-01-20 DIAGNOSIS — S91109A Unspecified open wound of unspecified toe(s) without damage to nail, initial encounter: Secondary | ICD-10-CM | POA: Diagnosis not present

## 2024-01-20 DIAGNOSIS — E1142 Type 2 diabetes mellitus with diabetic polyneuropathy: Secondary | ICD-10-CM

## 2024-01-22 ENCOUNTER — Ambulatory Visit

## 2024-01-23 LAB — VAS US ABI WITH/WO TBI
Left ABI: 1.02
Right ABI: 1.01

## 2024-01-27 ENCOUNTER — Other Ambulatory Visit: Payer: Self-pay | Admitting: Family Medicine

## 2024-01-29 ENCOUNTER — Ambulatory Visit

## 2024-01-29 DIAGNOSIS — M25512 Pain in left shoulder: Secondary | ICD-10-CM | POA: Diagnosis not present

## 2024-01-29 DIAGNOSIS — M7542 Impingement syndrome of left shoulder: Secondary | ICD-10-CM

## 2024-01-29 DIAGNOSIS — D0512 Intraductal carcinoma in situ of left breast: Secondary | ICD-10-CM | POA: Diagnosis not present

## 2024-01-29 DIAGNOSIS — G8929 Other chronic pain: Secondary | ICD-10-CM | POA: Diagnosis not present

## 2024-01-29 NOTE — Therapy (Signed)
 OUTPATIENT PHYSICAL THERAPY  UPPER EXTREMITY ONCOLOGY TREATMENT  Patient Name: [name] MRN: [id number] DOB:28-Jun-1946, 78 y.o., female Today's Date: 01/29/2024  END OF SESSION:  PT End of Session - 01/29/24 1356     Visit Number 4    Number of Visits 8    Date for PT Re-Evaluation 02/26/24    PT Start Time 1400    PT Stop Time 1456    PT Time Calculation (min) 56 min    Activity Tolerance Patient tolerated treatment well    Behavior During Therapy [facility] for tasks assessed/performed          Past Medical History:  Diagnosis Date   Allergic rhinitis    Allergy    Arthritis    Asthma    Breast cancer (HCC)    Chronic headache    Diabetes mellitus    Ductal carcinoma in situ (DCIS) of left breast 02/23/2022   DVT (deep venous thrombosis) (HCC)    Dyspnea    Heart murmur    History of radiation therapy    Left Breast 05/02/22-05/30/22- Dr. Lynwood Nasuti   Hypertension    Penicillin allergy 01/19/2020   Pulmonary embolism (HCC)    Pulmonary hypertension (HCC)    Seizures (HCC)    Past Surgical History:  Procedure Laterality Date   ABDOMINAL HYSTERECTOMY     partial, has ovaries   BREAST LUMPECTOMY WITH RADIOACTIVE SEED LOCALIZATION Left 03/29/2022   Procedure: LEFT BREAST LUMPECTOMY WITH RADIOACTIVE SEED LOCALIZATION;  Surgeon: Vanderbilt Ned, MD;  Location: [facility] OR;  Service: General;  Laterality: Left;   CARDIAC CATHETERIZATION  12/28/2010   Mod. pulmonary hypertension, normal coronary arteries   CARDIOVERSION N/A 11/23/2020   Procedure: CARDIOVERSION;  Surgeon: Francyne Headland, MD;  Location: [facility];  Service: Cardiovascular;  Laterality: N/A;   CHOLECYSTECTOMY N/A 12/20/2021   Procedure: LAPAROSCOPIC CHOLECYSTECTOMY WITH INTRAOPERATIVE CHOLANGIOGRAM;  Surgeon: Eletha Boas, MD;  Location: [facility];  Service: General;  Laterality: N/A;   DOPPLER ECHOCARDIOGRAPHY  10/08/2011   EF=>55%,mild asymmetric LVH, mod. TR, mod. PH, mild to mod LA dilatation   ESOPHAGEAL  MANOMETRY N/A 06/19/2023   Procedure: ESOPHAGEAL MANOMETRY (EM);  Surgeon: Saintclair Jasper, MD;  Location: [facility];  Service: Gastroenterology;  Laterality: N/A;   IR LUMBAR DISC ASPIRATION W/IMG GUIDE  01/12/2020   KNEE ARTHROSCOPY Left    KNEE SURGERY     Nuclear Stress Test  05/20/2006   No ischemia   PARTIAL HYSTERECTOMY     PLANTAR FASCIA SURGERY     TONSILLECTOMY     Patient Active Problem List   Diagnosis Date Noted   Right foot pain 11/27/2023   Headache 11/22/2023   Acute insomnia 09/26/2023   Sprain of anterior talofibular ligament of right ankle 07/02/2022   SSS (sick sinus syndrome) (HCC) 04/26/2022   Family history of breast cancer 02/28/2022   Family history of colon cancer in mother 02/28/2022   Ductal carcinoma in situ (DCIS) of left breast 02/23/2022   Family history of malignant neoplasm of digestive organs 02/02/2022   Anemia 12/29/2021   Chronic constipation 12/29/2021   Chronic anticoagulation - Xerelto 12/18/2021   Hyperthyroidism 12/18/2021   Status post total left knee replacement 12/09/2021   Presence of right artificial knee joint 10/30/2021   Constipation by delayed colonic transit 09/25/2021   Atypical atrial flutter (HCC) 06/24/2021   Chronic diastolic heart failure (HCC) 06/24/2021   Type 2 diabetes mellitus with diabetic neuropathy, unspecified (HCC) 06/20/2020   Dysphonia 04/08/2020  Discitis of lumbosacral region 01/04/2020   Hyperlipidemia associated with type 2 diabetes mellitus (HCC) 05/07/2019   Dizziness 05/07/2019   Obstructive sleep apnea treated with continuous positive airway pressure (CPAP) 06/03/2018   Left thyroid  nodule 02/06/2018   Insomnia secondary to chronic pain 01/26/2018   Class 3 drug-induced obesity with serious comorbidity and body mass index (BMI) of 40.0 to 44.9 in adult (HCC) 12/03/2017   Graves disease 10/15/2017   Bilateral high frequency sensorineural hearing loss 10/24/2016   Subjective tinnitus, bilateral  10/24/2016   Moderate persistent asthma 07/12/2015   Paroxysmal atrial fibrillation (HCC) 06/03/2015   Neuropathy due to secondary diabetes (HCC) 01/11/2015   Migraine headache without aura 08/24/2014   Allergic sinusitis 03/25/2014   DVT (deep venous thrombosis) hx of  03/25/2014   Osteoarthritis of right knee 03/25/2014    ? of Seizure disorder 03/25/2014   Depression, major, in remission (HCC) 03/25/2014   Hypertension associated with diabetes (HCC) 03/25/2014   GERD (gastroesophageal reflux disease) 03/25/2014   History of DVT (deep vein thrombosis) 03/25/2014   History of pulmonary embolism 10/09/2013   Asthma, moderate persistent 01/24/2011   Pulmonary hypertension (HCC) 01/11/2011    PCP:   REFERRING PROVIDER: Mackey Chad, MD  REFERRING DIAG: s/p Left Lumpectomy with DCIS/joint tightness  THERAPY DIAG:  Ductal carcinoma in situ of left breast  Chronic left shoulder pain  Shoulder impingement syndrome, left  ONSET DATE: Off and on since Oct 5,2024 after a fall  Rationale for Evaluation and Treatment: Rehabilitation  SUBJECTIVE:                                                                                                                                                                                           SUBJECTIVE STATEMENT:  My shoulder pain is on and off at different times. I do have arthritis in my shoulder. I do catch myself with my shoulders up sometimes, so it has been a help. I feel some stronger. I can reach into cabinets better now. I think I am off balance so I am going to see the DR about that. I haven't had a lot of pain in my shoulder, and it feels better overall. I think pain is about 25% better      EVAL Pt is having tightness and pain in her left shoulder after she does things. She uses ice at times. Sometimes that helps.It bothers her to lift heavy things  or to put things in and out of the cabinet repetiively. She used to work for the post  [employer] and injured her arm with lifting when she felt a pop in her  shoulder. She has had pain off and on since then. This onset approx Oct 2024. Pt is presently having rehab for SLP at [facility].  PERTINENT HISTORY:  PT is s/p Left lumpectomy for DCIS on 03/29/2022. She had radiation which ended on 05/30/2022. She is presently taking Anastrozole , Right TKA,Prior DVT, PE, asthma, hypertension, Type II diabetes with neuropathy PAIN:  Are you having pain? No not at rest NPRS scale: 6-7/10 with movement. Pain location: left shoulder Pain orientation: Left  PAIN TYPE: aching and sharp Pain description: intermittent  Aggravating factors: lifting, reaching repetitively, sometimes sleeping on that side, Relieving factors: sometimes ice, sometimes heat  PRECAUTIONS: Prior DVT, PE, asthma, hypertension, Type II diabetes with neuropathy  RED FLAGS: None   WEIGHT BEARING RESTRICTIONS: No  FALLS:  Has patient fallen in last 6 months? No  LIVING ENVIRONMENT: Lives with: lives alone Lives in: House/apartment Stairs: Yes; External: 3 steps; can reach both  OCCUPATION: retired from the post office  LEISURE: likes to go to senior center, chair yoga,  HAND DOMINANCE: bilateral   PRIOR LEVEL OF FUNCTION: Independent  PATIENT GOALS: I want to feel better,  improve ROM and strength on the left   OBJECTIVE: Note: Objective measures were completed at Evaluation unless otherwise noted.  COGNITION: Overall cognitive status: Within functional limits for tasks assessed   PALPATION: Tender left UT,and posterior left shoulder  OBSERVATIONS / OTHER ASSESSMENTS: UT compensation left greater than right with overhead reaching, + Left impingement test,  SENSATION: Light touch:    POSTURE: forward head, rounded shoulders  UPPER EXTREMITY AROM/PROM:  A/PROM RIGHT   eval   Shoulder extension 65  Shoulder flexion 147  Shoulder abduction 152  Shoulder internal rotation 60  Shoulder  external rotation 93    (Blank rows = not tested)  A/PROM LEFT   eval LEFT 01/29/2024  Shoulder extension 60   Shoulder flexion 140, UT compensation,+ shoulder 150 UT compensation  Shoulder abduction 120, UT compensation, + shoulder 123  Shoulder internal rotation 44+   Shoulder external rotation 77     (Blank rows = not tested)  CERVICAL AROM: All within fxl limits:     UPPER EXTREMITY STRENGTH: Strength 5/5 IR, 4/5 ER with pain, Flex 3+/5 pain, Abd 3-/5, 01/29/2024 IR 5/5, ER 4+/5 Flexion 4/5 no pain, ABD 3-/5  LYMPHEDEMA ASSESSMENTS:   SURGERY TYPE/DATE: Left Lumpectomy  NUMBER OF LYMPH NODES REMOVED: 0  CHEMOTHERAPY: No  RADIATION:Yes, ended 05/30/2022  HORMONE TREATMENT: Anastrozole   INFECTIONS: NO    FUNCTIONAL TESTS:    QUICK DASH SURVEY: EVAL 18.18%, 01/29/2024 9.09%                                                                                                                           TREATMENT DATE:  01/29/2024 Standing scapular retraction and shoulder extension yellow 2 x 10 Bilateral ER to neutral with yellow x 10 4 D ball rolls x 10 Shoulder ranger x 10 flexion,  x 10 scaption Reach to cabinet with  lb wt x 5 no pain Tested strength and measured AROM of left shoulder Reviewed goals with pt. Lengthy discussion about benefits of aquatic therapy for her multi joint OA and new balance deficits. She has done classes before and really enjoyed it. She will look into the [facility] for appropriate arthritis level classes 01/15/2024 Standing scapular retraction and shoulder extension yellow x 10, x 5 Bilateral ER to neutral with yellow x 10 Supine horizontal abd yellow x 10 Ball rolls on wall x 10 4 D Shoulder ranger on left x 10 flexion Tried serratus wall push up, but held due to poor form Supine shoulder flexion x 10, scaption x 5 Supine alphabet A-Z x 1 Alternating isometrics IR/ER x 30 sec Updated HEP  12/25/2023 Standing scapular  retraction and shoulder extension yellow x 10, x 5 Supine horizontal abd x 10 yellow Supine alphabet A-Z x 1 Alternating isometrics 2 x 20 sec IR and ER Rhythmic stabs 2 x 20 sec at 90 degrees Serratus punch x 15 no resistance Supine AROM shoulder flexion and scaption to 90 degrees with emphasis on scapular depression Pts LB started to bother her so she sat up and thenwalked around. Discussed and demonstrated proper way to get up and down from table and showed lumbar roll for sitting posture. Seated in chair with lumbar roll performed STM to left mid deltoid region with cocoa butter to decrease tissue tension.  . 12/19/2023 Pt was late getting started due to having the wrong appt time. She was able to be seen due to cancellation. Discussed POC, impingement and causes of impingement, upper trap compensation, weakness, LOS, treatment interventions. Instructed pt is standing scapular retraction with yellow band x 10, and standing shoulder extension with yellow band x 10. Also performed left UE ER x 7 in limited ROM but did not add to HEP due to discomfort in elbow. Pt advised to discontinue any exercise that causes pain.    PATIENT EDUCATION Access Code: [email or web address] URL: [email or web address] Date: 01/15/2024 Prepared by: Grayce Sheldon  Exercises - Shoulder External Rotation and Scapular Retraction with Resistance  - 1 x daily - 3 x weekly - 1 sets - 10 reps - Standing Wall Ball Circles with Mini Swiss Ball  - 1 x daily - 7 x weekly - 1 sets - 10 reps - Supine Shoulder Alphabet  - 1 x daily - 7 x weekly - 2 sets - 1 reps - Supine Single Arm Shoulder Protraction  - 1 x daily - 7 x weekly - 1 sets - 10 reps :  Education details: Impingement, compensation, SR and shoulder ext yellow band exs, LOS, interventions Person educated: Patient Education method: Explanation, Demonstration, and Handouts Education comprehension: verbalized understanding and returned demonstration  HOME  EXERCISE PROGRAM: Scapular retraction and shoulder extension with yellow band x 10 ea to do every other day.  ASSESSMENT:  CLINICAL IMPRESSION: Pt is independent in a HEP for strengthening of her left shoulder, and she is now able to reach better into cabinets to put objects up higher. Pain is 25% better and her Quick dash has improved by 9%. Shoulder abduction is still limited and pt does still compensate with UT with reaching activities. Strength for shoulder Flexion and ER has improved, although abd is still weak. Due to her multi level arthritis we did discuss the benefits of aquatic therapy for all of her joints. [facility]. She will continue therapy for 1x/ week for shoulder rehab for up to 4 weeks prn whild [facility] a [facility] program. OBJECTIVE IMPAIRMENTS: decreased activity tolerance, decreased knowledge of condition, decreased ROM, decreased strength, impaired UE functional use, postural dysfunction, and pain.   ACTIVITY LIMITATIONS: lifting, sleeping, bed mobility, and reach over head  PARTICIPATION LIMITATIONS: cleaning and other activities requiring repetitive movements  PERSONAL FACTORS: Age and 3+ comorbidities: Cancer, Diabetes, OA are also affecting patient's functional outcome.   REHAB POTENTIAL: Good  CLINICAL DECISION MAKING: Stable/uncomplicated  EVALUATION COMPLEXITY: Low  GOALS: Goals reviewed with patient? Yes  SHORT TERM GOALS=LONG TERM GOALS: Target date: 01/30/2024  Pt will be independent and compliant with HEP to improve Left shoulder ROM and strength Baseline: Goal status: MET 01/29/2024 2.  Pt will have decreased left shoulder pain by 50% or greater Baseline:  Goal status: IN PROGRESS 3.  Pt will have left shoulder abduction 140 degrees  without UT compensation for improved reahcing Baseline:  Goal status: In Progress 4.  Pt will have left shoulder strength 4-4+/5 without  increased pain Baseline:  Goal status: In Progress 5.  Pt will be able to reach into cabinet repetitively with  0.5lb with 0-minimal discomfort Baseline:  Goal status: MET 01/29/2024 PLAN:  PT FREQUENCY: 1x/ week  PT DURATION: 3-4 weeks  PLANNED INTERVENTIONS: 97164- PT Re-evaluation, 97750- Physical Performance Testing, 97110-Therapeutic exercises, 97530- Therapeutic activity, 97112- Neuromuscular re-education, 97535- Self Care, [id number]- Manual therapy, and Patient/Family education  PLAN FOR NEXT SESSION: scap PNF, wall slide, progress strength/stability and decrease UT compensation, update HEP, PROM.  Grayce JINNY Sheldon, PT 01/29/2024, 8:54 PM

## 2024-01-31 ENCOUNTER — Encounter: Payer: Self-pay | Admitting: Family Medicine

## 2024-01-31 ENCOUNTER — Ambulatory Visit: Payer: Self-pay | Admitting: Family Medicine

## 2024-01-31 ENCOUNTER — Ambulatory Visit

## 2024-01-31 ENCOUNTER — Ambulatory Visit: Admitting: Family Medicine

## 2024-01-31 VITALS — BP 98/46 | HR 40 | Temp 98.0°F | Ht 63.5 in | Wt 197.4 lb

## 2024-01-31 DIAGNOSIS — G4700 Insomnia, unspecified: Secondary | ICD-10-CM | POA: Diagnosis not present

## 2024-01-31 DIAGNOSIS — D638 Anemia in other chronic diseases classified elsewhere: Secondary | ICD-10-CM

## 2024-01-31 DIAGNOSIS — R2689 Other abnormalities of gait and mobility: Secondary | ICD-10-CM

## 2024-01-31 DIAGNOSIS — E039 Hypothyroidism, unspecified: Secondary | ICD-10-CM | POA: Diagnosis not present

## 2024-01-31 LAB — COMPREHENSIVE METABOLIC PANEL WITH GFR
ALT: 9 U/L (ref 0–35)
AST: 15 U/L (ref 0–37)
Albumin: 3.9 g/dL (ref 3.5–5.2)
Alkaline Phosphatase: 89 U/L (ref 39–117)
BUN: 13 mg/dL (ref 6–23)
CO2: 29 meq/L (ref 19–32)
Calcium: 9.1 mg/dL (ref 8.4–10.5)
Chloride: 107 meq/L (ref 96–112)
Creatinine, Ser: 1.01 mg/dL (ref 0.40–1.20)
GFR: 53.7 mL/min — ABNORMAL LOW (ref >60.00)
Glucose, Bld: 86 mg/dL (ref 70–99)
Potassium: 4.3 meq/L (ref 3.5–5.1)
Sodium: 143 meq/L (ref 135–145)
Total Bilirubin: 1 mg/dL (ref 0.2–1.2)
Total Protein: 6 g/dL (ref 6.0–8.3)

## 2024-01-31 LAB — IBC + FERRITIN
Ferritin: 16.9 ng/mL (ref 10.0–291.0)
Iron: 54 ug/dL (ref 42–145)
Saturation Ratios: 13.5 % — ABNORMAL LOW (ref 20.0–50.0)
TIBC: 400.4 ug/dL (ref 250.0–450.0)
Transferrin: 286 mg/dL (ref 212.0–360.0)

## 2024-01-31 LAB — CBC WITH DIFFERENTIAL/PLATELET
Basophils Absolute: 0 K/uL (ref 0.0–0.1)
Basophils Relative: 0.6 % (ref 0.0–3.0)
Eosinophils Absolute: 0.2 K/uL (ref 0.0–0.7)
Eosinophils Relative: 2.5 % (ref 0.0–5.0)
HCT: 40 % (ref 36.0–46.0)
Hemoglobin: 13 g/dL (ref 12.0–15.0)
Lymphocytes Relative: 26.4 % (ref 12.0–46.0)
Lymphs Abs: 1.7 K/uL (ref 0.7–4.0)
MCHC: 32.5 g/dL (ref 30.0–36.0)
MCV: 80.8 fl (ref 78.0–100.0)
Monocytes Absolute: 0.6 K/uL (ref 0.1–1.0)
Monocytes Relative: 9.9 % (ref 3.0–12.0)
Neutro Abs: 3.9 K/uL (ref 1.4–7.7)
Neutrophils Relative %: 60.6 % (ref 43.0–77.0)
Platelets: 219 K/uL (ref 150.0–400.0)
RBC: 4.95 Mil/uL (ref 3.87–5.11)
RDW: 22.4 % — ABNORMAL HIGH (ref 11.5–15.5)
WBC: 6.5 K/uL (ref 4.0–10.5)

## 2024-01-31 LAB — TSH: TSH: 4.23 u[IU]/mL (ref 0.35–5.50)

## 2024-01-31 LAB — VITAMIN B12: Vitamin B-12: >1500 pg/mL — ABNORMAL HIGH (ref 211–911)

## 2024-01-31 NOTE — Progress Notes (Signed)
 Patient ID: [name], female    DOB: 03-Jan-1946, 78 y.o.   MRN: [id number]  This visit was conducted in person.  BP (!) 98/46 Comment: Patient's Home BP Monitor  Pulse (!) 40   Temp 98 F (36.7 C) (Temporal)   Ht 5' 3.5 (1.613 m)   Wt 197 lb 6 oz (89.5 kg)   SpO2 100%   BMI 34.41 kg/m    CC:  Chief Complaint  Patient presents with   Insomnia   Gait Problem    Subjective:   HPI: [name] is a 78 y.o. female presenting on 01/31/2024 for Insomnia and Gait Problem  Patient with history of neuropathy secondary to diabetes and history of chronic low back pain/discitis of lumbosacral region  She reports she has been stumbling more, off balance.. in last 2 weeks.  No room spinning, no lightheadness.  No new meds, no dose changes.  No new headaches, no new back pain.. has chronic back issues.    She is currently going to PT for chronic left shoulder issues.   Acute insomnia  Some trouble sleeping at night... trouble falling asleep and staying asleep.  DID have steroid injeciton in knee but had issue before   No longer requiring CP. AP per MD, improved study.     Relevant past medical, surgical, family and social history reviewed and updated as indicated. Interim medical history since our last visit reviewed. Allergies and medications reviewed and updated. Outpatient Medications Prior to Visit  Medication Sig Dispense Refill   ADVAIR  HFA 230-21 MCG/ACT inhaler Inhale 2 puffs into the lungs 2 (two) times daily. On empty lungs 36 g 5   albuterol  (PROVENTIL ) (2.5 MG/3ML) 0.083% nebulizer solution Take 3 mLs (2.5 mg total) by nebulization every 6 (six) hours as needed for wheezing or shortness of breath. 150 mL 1   albuterol  (VENTOLIN  HFA) 108 (90 Base) MCG/ACT inhaler Inhale 2 puffs into the lungs every 6 (six) hours as needed for wheezing or shortness of breath (Take with Flovent  During Flare Ups.). INHALE 2 PUFFS BY MOUTH EVERY 6 HOURS AS NEEDED FOR WHEEZING OR  SHORTNESS OF BREATH 18 g 1   anastrozole  (ARIMIDEX ) 1 MG tablet TAKE 1 TABLET(1 MG) BY MOUTH DAILY 90 tablet 3   atorvastatin  (LIPITOR) 10 MG tablet TAKE 1 TABLET(10 MG) BY MOUTH DAILY 90 tablet 3   Blood Glucose Monitoring Suppl (ONETOUCH VERIO) w/Device KIT Use to check blood sugar up to 2 times a day 1 kit 0   budesonide  (PULMICORT ) 0.5 MG/2ML nebulizer solution Take 2 mLs (0.5 mg total) by nebulization in the morning and at bedtime. 120 mL 0   cyclobenzaprine  (FLEXERIL ) 10 MG tablet Take 0.5-1 tablets (5-10 mg total) by mouth at bedtime as needed for muscle spasms. 20 tablet 0   desloratadine  (CLARINEX ) 5 MG tablet TAKE 1 TABLET(5 MG) BY MOUTH DAILY 90 tablet 1   Empagliflozin -linaGLIPtin  (GLYXAMBI ) 10-5 MG TABS TAKE 1 TABLET BY MOUTH DAILY 30 tablet 2   EPINEPHrine  0.3 mg/0.3 mL IJ SOAJ injection Inject 0.3 mg into the muscle as needed for anaphylaxis. USE AS DIRECTED FOR LIFE THREATENING ALLERGIC REACTIONS 2 each 1   estradiol  (ESTRACE ) 0.1 MG/GM vaginal cream Place 1 Applicatorful vaginally 2 (two) times a week. (As Needed)     famotidine  (PEPCID ) 40 MG tablet Take 1 tablet (40 mg total) by mouth at bedtime. Take 1 (ONE) tablet by mouth every evening. 90 tablet 1   fluticasone  (FLOVENT  HFA) 220 MCG/ACT  inhaler Inhale 2 puffs into the lungs every 6 (six) hours as needed. Use with Albuterol  during flare ups. 36 g 1   gabapentin  (NEURONTIN ) 300 MG capsule TAKE 1 CAPSULE BY MOUTH IN THE MORNING, 1 CAPSULE AT LUNCH AND 2 CAPSULES AT BEDTIME 360 capsule 3   glucose blood (ONETOUCH VERIO) test strip 1 strip 2 (two) times daily     hydrOXYzine  (ATARAX ) 10 MG tablet Take 1 tablet (10 mg total) by mouth daily as needed for itching. 30 tablet 2   ipratropium (ATROVENT ) 0.06 % nasal spray Place 2 sprays into both nostrils 2 (two) times daily as needed for rhinitis. 45 mL 1   irbesartan  (AVAPRO ) 150 MG tablet TAKE 1 TABLET(150 MG) BY MOUTH DAILY 90 tablet 1   Lactase (DAIRY-RELIEF PO) Take 1 capsule by  mouth daily as needed (eating dairy).     Lancets (ONETOUCH DELICA PLUS LANCET33G) MISC USE TO CHECK BLOOD SUGAR UP TO TWICE DAILY 100 each 5   Magnesium 500 MG TABS Take 500 mg by mouth 2 (two) times daily.     Menthol , Topical Analgesic, (ABSORBINE PLUS JR EX) Apply 1 patch topically daily as needed (pain).     Menthol -Methyl Salicylate (SALONPAS JET SPRAY EX) Apply 1 spray topically daily as needed (knee pain).     methimazole  (TAPAZOLE ) 5 MG tablet Take 1 tablet (5 mg total) by mouth daily. 90 tablet 1   montelukast  (SINGULAIR ) 10 MG tablet Take 1 tablet (10 mg total) by mouth at bedtime. For asthma control. 90 tablet 1   Mouthwashes (BIOTENE DRY MOUTH MT) Use as directed 1 Dose in the mouth or throat daily as needed (dry mouth).     Nebulizer MISC 1 Device by Does not apply route as directed. 1 each 1   ondansetron  (ZOFRAN -ODT) 4 MG disintegrating tablet Take 1 tablet (4 mg total) by mouth every 8 (eight) hours as needed for nausea or vomiting. 20 tablet 0   pantoprazole  (PROTONIX ) 40 MG tablet Take 1 tablet (40 mg total) by mouth 2 (two) times daily. 180 tablet 1   Plecanatide (TRULANCE) 3 MG TABS Take 3 mg by mouth daily as needed (constipation).     Polyethyl Glycol-Propyl Glycol (SYSTANE OP) Place 1 drop into both eyes daily as needed (dry eyes).     polyethylene glycol powder (GLYCOLAX /MIRALAX ) 17 GM/SCOOP powder Take 1 Container by mouth once.     povidone-iodine  (BETADINE ) 10 % external solution Apply 1 Application topically as needed for wound care. 480 mL 0   PRESCRIPTION MEDICATION Inhale into the lungs at bedtime. CPAP     Respiratory Therapy Supplies (NEBULIZER MASK ADULT) MISC 1 kit by Does not apply route as directed. 1 each 1   rivaroxaban  (XARELTO ) 20 MG TABS tablet Take 1 tablet (20 mg total) by mouth daily with supper. 90 tablet 1   triamcinolone  (NASACORT ) 55 MCG/ACT AERO nasal inhaler Place 2 sprays into the nose daily. 50.7 mL 1   Vibegron  (GEMTESA ) 75 MG TABS 1 tablet  daily 30 tablet 0   fluconazole  (DIFLUCAN ) 150 MG tablet Take one pill by mouth now and repeat dose in 3 days 2 tablet 0   Facility-Administered Medications Prior to Visit  Medication Dose Route Frequency Provider Last Rate Last Admin   tezepelumab -ekko (TEZSPIRE ) 210 MG/1. syringe 210 mg  210 mg Subcutaneous Q28 days Kozlow, Eric J, MD   210 mg at 01/14/24 9070     Per HPI unless specifically indicated in ROS section below Review of  Systems  Constitutional:  Negative for fatigue and fever.  HENT:  Negative for ear pain.   Eyes:  Negative for pain.  Respiratory:  Negative for chest tightness and shortness of breath.   Cardiovascular:  Negative for chest pain, palpitations and leg swelling.  Gastrointestinal:  Negative for abdominal pain.  Genitourinary:  Negative for dysuria.   Objective:  BP (!) 98/46 Comment: Patient's Home BP Monitor  Pulse (!) 40   Temp 98 F (36.7 C) (Temporal)   Ht 5' 3.5 (1.613 m)   Wt 197 lb 6 oz (89.5 kg)   SpO2 100%   BMI 34.41 kg/m   Wt Readings from Last 3 Encounters:  01/31/24 197 lb 6 oz (89.5 kg)  01/07/24 193 lb (87.5 kg)  11/29/23 194 lb 6 oz (88.2 kg)      Physical Exam Constitutional:      General: She is not in acute distress.    Appearance: Normal appearance. She is well-developed. She is not ill-appearing or toxic-appearing.  HENT:     Head: Normocephalic.     Right Ear: Hearing, tympanic membrane, ear canal and external ear normal. Tympanic membrane is not erythematous, retracted or bulging.     Left Ear: Hearing, tympanic membrane, ear canal and external ear normal. Tympanic membrane is not erythematous, retracted or bulging.     Nose: No mucosal edema or rhinorrhea.     Right Sinus: No maxillary sinus tenderness or frontal sinus tenderness.     Left Sinus: No maxillary sinus tenderness or frontal sinus tenderness.     Mouth/Throat:     Pharynx: Uvula midline.  Eyes:     General: Lids are normal. Lids are everted, no  foreign bodies appreciated.     Conjunctiva/sclera: Conjunctivae normal.     Pupils: Pupils are equal, round, and reactive to light.  Neck:     Thyroid : No thyroid  mass or thyromegaly.     Vascular: No carotid bruit.     Trachea: Trachea normal.  Cardiovascular:     Rate and Rhythm: Normal rate and regular rhythm.     Pulses: Normal pulses.     Heart sounds: Normal heart sounds, S1 normal and S2 normal. No murmur heard.    No friction rub. No gallop.  Pulmonary:     Effort: Pulmonary effort is normal. No tachypnea or respiratory distress.     Breath sounds: Normal breath sounds. No decreased breath sounds, wheezing, rhonchi or rales.  Abdominal:     General: Bowel sounds are normal.     Palpations: Abdomen is soft.     Tenderness: There is no abdominal tenderness.  Musculoskeletal:     Cervical back: Normal range of motion and neck supple.  Skin:    General: Skin is warm and dry.     Findings: No rash.  Neurological:     Mental Status: She is alert.     Cranial Nerves: Cranial nerves 2-12 are intact.     Sensory: Sensory deficit present.     Motor: Motor function is intact. No weakness.     Coordination: Coordination is intact. Romberg sign negative. Coordination normal. Finger-Nose-Finger Test normal.     Gait: Tandem walk abnormal.     Comments: Decreased sensation to light touch in bilateral feet  Psychiatric:        Mood and Affect: Mood is not anxious or depressed.        Speech: Speech normal.        Behavior: Behavior normal. Behavior  is cooperative.        Thought Content: Thought content normal.        Judgment: Judgment normal.       Results for orders placed or performed in visit on 01/20/24  VAS US  ABI WITH/WO TBI   Collection Time: 01/20/24 11:43 AM  Result Value Ref Range   Right ABI 1.01    Left ABI 1.02    *Note: Due to a large number of results and/or encounters for the requested time period, some results have not been displayed. A complete set of  results can be found in Results Review.    Assessment and Plan  Acute insomnia Assessment & Plan: Acute, unclear etiology.  Will try melatonin at bedtime.  Reviewed healthy sleep hygiene.  Will avoid prescription medication to avoid oversedation and increase in fall risk.   Balance problem Assessment & Plan: Acute, likely multifactorial due to diabetic peripheral neuropathy, decreased vision, poor sleep etc.  Will evaluate with labs to rule out secondary cause.  Patient denies vertigo-like symptoms and ear exam is within the normal limits. Encouraged her to use a 4-prong cane or walker for stability. She is currently receiving physical therapy for her shoulder but she will ask them to add physical therapy for her knee and if needed we can always order balance retraining physical therapy exercises.  Orders: -     Comprehensive metabolic panel with GFR -     CBC with Differential/Platelet -     Vitamin B12 -     TSH  Hypothyroidism, unspecified type -     TSH    No follow-ups on file.   Greig Ring, MD

## 2024-01-31 NOTE — Assessment & Plan Note (Signed)
 Acute, unclear etiology.  Will try melatonin at bedtime.  Reviewed healthy sleep hygiene.  Will avoid prescription medication to avoid oversedation and increase in fall risk.

## 2024-01-31 NOTE — Assessment & Plan Note (Signed)
 Acute, likely multifactorial due to diabetic peripheral neuropathy, decreased vision, poor sleep etc.  Will evaluate with labs to rule out secondary cause.  Patient denies vertigo-like symptoms and ear exam is within the normal limits. Encouraged her to use a 4-prong cane or walker for stability. She is currently receiving physical therapy for her shoulder but she will ask them to add physical therapy for her knee and if needed we can always order balance retraining physical therapy exercises.

## 2024-01-31 NOTE — Addendum Note (Signed)
 Addended by: ISADORA RAISIN on: 01/31/2024 03:30 PM   Modules accepted: Orders

## 2024-01-31 NOTE — Patient Instructions (Signed)
 Can try melatonin at night for sleep.SABRA start 3-10 mg at night.  Healthy sleep hygiene.   Please stop at the lab to have labs drawn.

## 2024-02-03 ENCOUNTER — Ambulatory Visit

## 2024-02-03 DIAGNOSIS — D0512 Intraductal carcinoma in situ of left breast: Secondary | ICD-10-CM

## 2024-02-03 DIAGNOSIS — M7542 Impingement syndrome of left shoulder: Secondary | ICD-10-CM | POA: Diagnosis not present

## 2024-02-03 DIAGNOSIS — G8929 Other chronic pain: Secondary | ICD-10-CM | POA: Diagnosis not present

## 2024-02-03 DIAGNOSIS — M25512 Pain in left shoulder: Secondary | ICD-10-CM | POA: Diagnosis not present

## 2024-02-03 NOTE — Therapy (Signed)
 OUTPATIENT PHYSICAL THERAPY  UPPER EXTREMITY ONCOLOGY TREATMENT  Patient Name: [name] MRN: [id number] DOB:Dec 21, 1945, 78 y.o., female Today's Date: 02/03/2024  END OF SESSION:  PT End of Session - 02/03/24 1217     Visit Number 5    Number of Visits 8    Date for PT Re-Evaluation 02/26/24    PT Start Time 1205    PT Stop Time 1302    PT Time Calculation (min) 57 min    Activity Tolerance Patient tolerated treatment well    Behavior During Therapy [facility] for tasks assessed/performed          Past Medical History:  Diagnosis Date   Allergic rhinitis    Allergy    Arthritis    Asthma    Breast cancer (HCC)    Chronic headache    Diabetes mellitus    Ductal carcinoma in situ (DCIS) of left breast 02/23/2022   DVT (deep venous thrombosis) (HCC)    Dyspnea    Heart murmur    History of radiation therapy    Left Breast 05/02/22-05/30/22- Dr. Lynwood Nasuti   Hypertension    Penicillin allergy 01/19/2020   Pulmonary embolism (HCC)    Pulmonary hypertension (HCC)    Seizures (HCC)    Past Surgical History:  Procedure Laterality Date   ABDOMINAL HYSTERECTOMY     partial, has ovaries   BREAST LUMPECTOMY WITH RADIOACTIVE SEED LOCALIZATION Left 03/29/2022   Procedure: LEFT BREAST LUMPECTOMY WITH RADIOACTIVE SEED LOCALIZATION;  Surgeon: Vanderbilt Ned, MD;  Location: [facility] OR;  Service: General;  Laterality: Left;   CARDIAC CATHETERIZATION  12/28/2010   Mod. pulmonary hypertension, normal coronary arteries   CARDIOVERSION N/A 11/23/2020   Procedure: CARDIOVERSION;  Surgeon: Francyne Headland, MD;  Location: [facility];  Service: Cardiovascular;  Laterality: N/A;   CHOLECYSTECTOMY N/A 12/20/2021   Procedure: LAPAROSCOPIC CHOLECYSTECTOMY WITH INTRAOPERATIVE CHOLANGIOGRAM;  Surgeon: Eletha Boas, MD;  Location: [facility] ORS;  Service: General;  Laterality: N/A;   DOPPLER ECHOCARDIOGRAPHY  10/08/2011   EF=>55%,mild asymmetric LVH, mod. TR, mod. PH, mild to mod LA dilatation   ESOPHAGEAL  MANOMETRY N/A 06/19/2023   Procedure: ESOPHAGEAL MANOMETRY (EM);  Surgeon: Saintclair Jasper, MD;  Location: [facility] ENDOSCOPY;  Service: Gastroenterology;  Laterality: N/A;   IR LUMBAR DISC ASPIRATION W/IMG GUIDE  01/12/2020   KNEE ARTHROSCOPY Left    KNEE SURGERY     Nuclear Stress Test  05/20/2006   No ischemia   PARTIAL HYSTERECTOMY     PLANTAR FASCIA SURGERY     TONSILLECTOMY     Patient Active Problem List   Diagnosis Date Noted   Balance problem 01/31/2024   Right foot pain 11/27/2023   Headache 11/22/2023   Acute insomnia 09/26/2023   Sprain of anterior talofibular ligament of right ankle 07/02/2022   SSS (sick sinus syndrome) (HCC) 04/26/2022   Family history of breast cancer 02/28/2022   Family history of colon cancer in mother 02/28/2022   Ductal carcinoma in situ (DCIS) of left breast 02/23/2022   Family history of malignant neoplasm of digestive organs 02/02/2022   Anemia 12/29/2021   Chronic constipation 12/29/2021   Chronic anticoagulation - Xerelto 12/18/2021   Hyperthyroidism 12/18/2021   Status post total left knee replacement 12/09/2021   Presence of right artificial knee joint 10/30/2021   Constipation by delayed colonic transit 09/25/2021   Atypical atrial flutter (HCC) 06/24/2021   Chronic diastolic heart failure (HCC) 06/24/2021   Type 2 diabetes mellitus with diabetic neuropathy, unspecified (HCC) 06/20/2020  Dysphonia 04/08/2020   Discitis of lumbosacral region 01/04/2020   Hyperlipidemia associated with type 2 diabetes mellitus (HCC) 05/07/2019   Dizziness 05/07/2019   Obstructive sleep apnea treated with continuous positive airway pressure (CPAP) 06/03/2018   Left thyroid  nodule 02/06/2018   Insomnia secondary to chronic pain 01/26/2018   Class 3 drug-induced obesity with serious comorbidity and body mass index (BMI) of 40.0 to 44.9 in adult (HCC) 12/03/2017   Graves disease 10/15/2017   Bilateral high frequency sensorineural hearing loss 10/24/2016    Subjective tinnitus, bilateral 10/24/2016   Moderate persistent asthma 07/12/2015   Paroxysmal atrial fibrillation (HCC) 06/03/2015   Neuropathy due to secondary diabetes (HCC) 01/11/2015   Migraine headache without aura 08/24/2014   Allergic sinusitis 03/25/2014   DVT (deep venous thrombosis) hx of  03/25/2014   Osteoarthritis of right knee 03/25/2014    ? of Seizure disorder 03/25/2014   Depression, major, in remission (HCC) 03/25/2014   Hypertension associated with diabetes (HCC) 03/25/2014   GERD (gastroesophageal reflux disease) 03/25/2014   History of DVT (deep vein thrombosis) 03/25/2014   History of pulmonary embolism 10/09/2013   Asthma, moderate persistent 01/24/2011   Pulmonary hypertension (HCC) 01/11/2011    PCP:   REFERRING PROVIDER: Mackey Chad, MD  REFERRING DIAG: s/p Left Lumpectomy with DCIS/joint tightness  THERAPY DIAG:  Ductal carcinoma in situ of left breast  Chronic left shoulder pain  Shoulder impingement syndrome, left  ONSET DATE: Off and on since Oct 5,2024 after a fall  Rationale for Evaluation and Treatment: Rehabilitation  SUBJECTIVE:                                                                                                                                                                                           SUBJECTIVE STATEMENT: I'm much more aware of when I hike my shoulder up with my ADLs and can keep it down better than before. I'm going to call [facility] and [facility] today to see what would be the best option for me.     EVAL Pt is having tightness and pain in her left shoulder after she does things. She uses ice at times. Sometimes that helps.It bothers her to lift heavy things  or to put things in and out of the cabinet repetiively. She used to work for the [employer] and injured her arm with lifting when she felt a pop in her shoulder. She has had pain off and on since then. This onset approx Oct 2024. Pt is presently having  rehab for SLP at [facility].  PERTINENT HISTORY:  PT is s/p Left lumpectomy for DCIS  on 03/29/2022. She had radiation which ended on 05/30/2022. She is presently taking Anastrozole , Right TKA,Prior DVT, PE, asthma, hypertension, Type II diabetes with neuropathy PAIN:  Are you having pain? No, not currently   PRECAUTIONS: Prior DVT, PE, asthma, hypertension, Type II diabetes with neuropathy  RED FLAGS: None   WEIGHT BEARING RESTRICTIONS: No  FALLS:  Has patient fallen in last 6 months? No  LIVING ENVIRONMENT: Lives with: lives alone Lives in: House/apartment Stairs: Yes; External: 3 steps; can reach both  OCCUPATION: retired from the post office  LEISURE: likes to go to senior center, chair yoga,  HAND DOMINANCE: bilateral   PRIOR LEVEL OF FUNCTION: Independent  PATIENT GOALS: I want to feel better,  improve ROM and strength on the left   OBJECTIVE: Note: Objective measures were completed at Evaluation unless otherwise noted.  COGNITION: Overall cognitive status: Within functional limits for tasks assessed   PALPATION: Tender left UT,and posterior left shoulder  OBSERVATIONS / OTHER ASSESSMENTS: UT compensation left greater than right with overhead reaching, + Left impingement test,  SENSATION: Light touch:    POSTURE: forward head, rounded shoulders  UPPER EXTREMITY AROM/PROM:  A/PROM RIGHT   eval   Shoulder extension 65  Shoulder flexion 147  Shoulder abduction 152  Shoulder internal rotation 60  Shoulder external rotation 93    (Blank rows = not tested)  A/PROM LEFT   eval LEFT 01/29/2024  Shoulder extension 60   Shoulder flexion 140, UT compensation,+ shoulder 150 UT compensation  Shoulder abduction 120, UT compensation, + shoulder 123  Shoulder internal rotation 44+   Shoulder external rotation 77     (Blank rows = not tested)  CERVICAL AROM: All within fxl limits:     UPPER EXTREMITY STRENGTH: Strength 5/5 IR, 4/5 ER with pain,  Flex 3+/5 pain, Abd 3-/5, 01/29/2024 IR 5/5, ER 4+/5 Flexion 4/5 no pain, ABD 3-/5  LYMPHEDEMA ASSESSMENTS:   SURGERY TYPE/DATE: Left Lumpectomy  NUMBER OF LYMPH NODES REMOVED: 0  CHEMOTHERAPY: No  RADIATION:Yes, ended 05/30/2022  HORMONE TREATMENT: Anastrozole   INFECTIONS: NO    FUNCTIONAL TESTS:    QUICK DASH SURVEY: EVAL 18.18%, 01/29/2024 9.09%                                                                                                                           TREATMENT DATE: 02/03/24: Therapeutic Exercises Pulleys into flex and and x 2 mins each returning therapist demo and tactile cues to decrease Lt scapula compensation UE Ranger into Lt flex and scaption x 10 each with VC's to decr Lt scap compensation Roll yellow ball up wall into flex x 10 4 D ball rolls x 10 with blue soft ball Therapeutic Activities Free Motion Machine: Scap Retract 7# x 20, then bil UE ext x 20 Red weighted ball for side-side and up-down x 30 sec each returning therapist demo Supine Scapular series with yellow theraband with HOB elevated ~45 degrees x 10 each returning therapist demo for  each: Horz abd, bil UE er, narrow and wide grip flex, then D2  Manual Therapy P/ROM to Lt shoulder into flex, abd and D2 with scapular depression by therapist throughout, VC's to relax due to muscle guarding STM to Lt pect insertion where pt palpably tight   01/29/2024 Standing scapular retraction and shoulder extension yellow 2 x 10 Bilateral ER to neutral with yellow x 10 4 D ball rolls x 10 Shoulder ranger x 10 flexion, x 10 scaption Reach to cabinet with  lb wt x 5 no pain Tested strength and measured AROM of left shoulder Reviewed goals with pt. Lengthy discussion about benefits of aquatic therapy for her multi joint OA and new balance deficits. She has done classes before and really enjoyed it. She will look into the [facility] for appropriate arthritis level classes 01/15/2024 Standing  scapular retraction and shoulder extension yellow x 10, x 5 Bilateral ER to neutral with yellow x 10 Supine horizontal abd yellow x 10 Ball rolls on wall x 10 4 D Shoulder ranger on left x 10 flexion Tried serratus wall push up, but held due to poor form Supine shoulder flexion x 10, scaption x 5 Supine alphabet A-Z x 1 Alternating isometrics IR/ER x 30 sec Updated HEP  12/25/2023 Standing scapular retraction and shoulder extension yellow x 10, x 5 Supine horizontal abd x 10 yellow Supine alphabet A-Z x 1 Alternating isometrics 2 x 20 sec IR and ER Rhythmic stabs 2 x 20 sec at 90 degrees Serratus punch x 15 no resistance Supine AROM shoulder flexion and scaption to 90 degrees with emphasis on scapular depression Pts LB started to bother her so she sat up and thenwalked around. Discussed and demonstrated proper way to get up and down from table and showed lumbar roll for sitting posture. Seated in chair with lumbar roll performed STM to left mid deltoid region with cocoa butter to decrease tissue tension.    PATIENT EDUCATION Access Code: [email or web address] URL: [email or web address] Date: 01/15/2024 Prepared by: Grayce Sheldon  Exercises - Shoulder External Rotation and Scapular Retraction with Resistance  - 1 x daily - 3 x weekly - 1 sets - 10 reps - Standing Wall Ball Circles with Mini Swiss Ball  - 1 x daily - 7 x weekly - 1 sets - 10 reps - Supine Shoulder Alphabet  - 1 x daily - 7 x weekly - 2 sets - 1 reps - Supine Single Arm Shoulder Protraction  - 1 x daily - 7 x weekly - 1 sets - 10 reps :  Education details: Supine scapular series with yellow theraband Person educated: Patient Education method: Explanation, Demonstration, and Handouts Education comprehension: verbalized understanding and returned demonstration, will benefit from further review  HOME EXERCISE PROGRAM: Scapular retraction and shoulder extension with yellow band x 10 ea to do every other  day. 02/03/24 - supine scapular series with yellow theraband  ASSESSMENT:  CLINICAL IMPRESSION: Continued with Lt shoulder and postural strength with scapular stability. Progressed HEP to include supine scapular series as pt had no pain with these exs and was able to return correct demo. Brief P/ROM at end of session with STM to tight pect tendon. Pt plans to call [facility] and [facility] to see which will be best for her to transition to after D/C.   OBJECTIVE IMPAIRMENTS: decreased activity tolerance, decreased knowledge of condition, decreased ROM, decreased strength, impaired UE functional use, postural dysfunction, and pain.   ACTIVITY LIMITATIONS: lifting, sleeping, bed  mobility, and reach over head  PARTICIPATION LIMITATIONS: cleaning and other activities requiring repetitive movements  PERSONAL FACTORS: Age and 3+ comorbidities: Cancer, Diabetes, OA are also affecting patient's functional outcome.   REHAB POTENTIAL: Good  CLINICAL DECISION MAKING: Stable/uncomplicated  EVALUATION COMPLEXITY: Low  GOALS: Goals reviewed with patient? Yes  SHORT TERM GOALS=LONG TERM GOALS: Target date: 01/30/2024  Pt will be independent and compliant with HEP to improve Left shoulder ROM and strength Baseline: Goal status: MET 01/29/2024 2.  Pt will have decreased left shoulder pain by 50% or greater Baseline:  Goal status: IN PROGRESS 3.  Pt will have left shoulder abduction 140 degrees  without UT compensation for improved reahcing Baseline:  Goal status: In Progress 4.  Pt will have left shoulder strength 4-4+/5 without increased pain Baseline:  Goal status: In Progress 5.  Pt will be able to reach into cabinet repetitively with  0.5lb with 0-minimal discomfort Baseline:  Goal status: MET 01/29/2024 PLAN:  PT FREQUENCY: 1x/ week  PT DURATION: 3-4 weeks  PLANNED INTERVENTIONS: 97164- PT Re-evaluation, 97750- Physical Performance Testing, 97110-Therapeutic exercises, 97530- Therapeutic  activity, 97112- Neuromuscular re-education, 97535- Self Care, [id number]- Manual therapy, and Patient/Family education  PLAN FOR NEXT SESSION: Review new HEP, Cont scap PNF, wall slide, progress strength/stability and cont to work on decreasing UT compensation, update HEP prn, PROM.  Aden Berwyn Caldron, PTA 02/03/2024, 1:19 PM   Over Head Pull: Narrow and Wide Grip   [facility] [phone]   On back, knees bent, feet flat, band across thighs, elbows straight but relaxed. Pull hands apart (start). Keeping elbows straight, bring arms up and over head, hands toward floor. Keep pull steady on band. Hold momentarily. Return slowly, keeping pull steady, back to start. Then do same with a wider grip on the band (past shoulder width) Repeat _5-10__ times. Band color __yellow____   Side Pull: Double Arm   On back, knees bent, feet flat. Arms perpendicular to body, shoulder level, elbows straight but relaxed. Pull arms out to sides, elbows straight. Resistance band comes across collarbones, hands toward floor. Hold momentarily. Slowly return to starting position. Repeat _5-10__ times. Band color _yellow____   Sword   On back, knees bent, feet flat, left hand on left hip, right hand above left. Pull right arm DIAGONALLY (hip to shoulder) across chest. Bring right arm along head toward floor. Hold momentarily. Slowly return to starting position. Repeat _5-10__ times. Do with left arm. Band color _yellow_____   Shoulder Rotation: Double Arm   On back, knees bent, feet flat, elbows tucked at sides, bent 90, hands palms up. Pull hands apart and down toward floor, keeping elbows near sides. Hold momentarily. Slowly return to starting position. Repeat _5-10__ times. Band color __yellow____

## 2024-02-03 NOTE — Patient Instructions (Addendum)
 [name]

## 2024-02-07 ENCOUNTER — Ambulatory Visit (INDEPENDENT_AMBULATORY_CARE_PROVIDER_SITE_OTHER): Admitting: Otolaryngology

## 2024-02-07 ENCOUNTER — Encounter (INDEPENDENT_AMBULATORY_CARE_PROVIDER_SITE_OTHER): Payer: Self-pay | Admitting: Otolaryngology

## 2024-02-07 VITALS — BP 160/76 | HR 91

## 2024-02-07 DIAGNOSIS — K219 Gastro-esophageal reflux disease without esophagitis: Secondary | ICD-10-CM

## 2024-02-07 DIAGNOSIS — R0989 Other specified symptoms and signs involving the circulatory and respiratory systems: Secondary | ICD-10-CM

## 2024-02-07 DIAGNOSIS — R0981 Nasal congestion: Secondary | ICD-10-CM

## 2024-02-07 DIAGNOSIS — K224 Dyskinesia of esophagus: Secondary | ICD-10-CM | POA: Diagnosis not present

## 2024-02-07 DIAGNOSIS — R1313 Dysphagia, pharyngeal phase: Secondary | ICD-10-CM

## 2024-02-07 NOTE — Patient Instructions (Signed)

## 2024-02-07 NOTE — Progress Notes (Signed)
 ENT CONSULT:  Reason for Consult: chronic throat clearing dysphagia with trace aspiration on MBS    HPI: Discussed the use of AI scribe software for clinical note transcription with the patient, who gave verbal consent to proceed.  History of Present Illness [name] is a 78 year old female with hx of GERD LPR and esophageal dysmotility, pharyngeal dysphagia, who presents with chronic throat clearing, pharyngeal dysphagia and recent episode of minor hemoptysis.   She has been experiencing chronic throat clearing, and some difficulty swallowing and a sensation of mucus in her throat. She has been attending swallowing therapy, which she found beneficial, but missed her last two sessions. This morning, after attempting to swallow, she expectorated blood, which was a new occurrence for her, small amount, and it stopped after one time.   She is currently on Xarelto  for a history of a blood clot. No history of epistaxis. She has been experiencing throat clearing and coughing. She has been taking pantoprazole  and pepcid  to manage reflux symptoms, which include throat clearing.   Records Reviewed:  Office visit with Asthma and Allergy Dr Kozlow 01/07/24 [name] returns to this clinic in evaluation of asthma, history of pulmonary hypertension/diastolic dysfunction, allergic rhinitis, LPR, esophageal dysfunction, and food allergy directed against shellfish and fish.  I last sawher  in this clinic 24 September 2023.   During her last visit we had her obtain a modified barium swallow which identified pharyngeal dysfunction and she underwent speech therapy with exercises and she is much better regarding her throat clearing and the mucus stuck in her throat and her intermittent raspy voice.  She does have an appointment with ENT 12 January 2024.   She has had very little problems with her asthma.  She does have a throat clearing like cough but no wheezing and does not feel short of breath and rarely uses a  short acting bronchodilator while she continues on anti-TSLP antibody and combination inhaler and she has had very little issue with her upper airway as well.   Classic reflux symptoms are under good control with her current plan.  During her last visit we increased her pantoprazole  to twice a day and she continues on famotidine  in the evening.   1. Continue to treat and prevent airway inflammation:   A.  Advair  230 - 2 inhalations 2 times a day (empty lungs)             B.  Montelukast  10mg  - 1 tablet 1 time per day.             C.  Tezepelumab  injections.     2. Continue to treat reflux / swallowing issue:   A. Pantoprazole  40mg  - 2 times per day B. Famotidine  40 mg in PM C. Replace throat clearing with swallowing / drinking maneuver D. Evaluation of throat with ENT - 12 January 2024  Past Medical History:  Diagnosis Date   Allergic rhinitis    Allergy    Arthritis    Asthma    Breast cancer (HCC)    Chronic headache    Diabetes mellitus    Ductal carcinoma in situ (DCIS) of left breast 02/23/2022   DVT (deep venous thrombosis) (HCC)    Dyspnea    Heart murmur    History of radiation therapy    Left Breast 05/02/22-05/30/22- Dr. Lynwood Nasuti   Hypertension    Penicillin allergy 01/19/2020   Pulmonary embolism (HCC)    Pulmonary hypertension (HCC)    Seizures (  HCC)     Past Surgical History:  Procedure Laterality Date   ABDOMINAL HYSTERECTOMY     partial, has ovaries   BREAST LUMPECTOMY WITH RADIOACTIVE SEED LOCALIZATION Left 03/29/2022   Procedure: LEFT BREAST LUMPECTOMY WITH RADIOACTIVE SEED LOCALIZATION;  Surgeon: Vanderbilt Ned, MD;  Location: [facility] OR;  Service: General;  Laterality: Left;   CARDIAC CATHETERIZATION  12/28/2010   Mod. pulmonary hypertension, normal coronary arteries   CARDIOVERSION N/A 11/23/2020   Procedure: CARDIOVERSION;  Surgeon: Francyne Headland, MD;  Location: [facility];  Service: Cardiovascular;  Laterality: N/A;   CHOLECYSTECTOMY N/A 12/20/2021    Procedure: LAPAROSCOPIC CHOLECYSTECTOMY WITH INTRAOPERATIVE CHOLANGIOGRAM;  Surgeon: Eletha Boas, MD;  Location: [facility] ORS;  Service: General;  Laterality: N/A;   DOPPLER ECHOCARDIOGRAPHY  10/08/2011   EF=>55%,mild asymmetric LVH, mod. TR, mod. PH, mild to mod LA dilatation   ESOPHAGEAL MANOMETRY N/A 06/19/2023   Procedure: ESOPHAGEAL MANOMETRY (EM);  Surgeon: Saintclair Jasper, MD;  Location: [facility];  Service: Gastroenterology;  Laterality: N/A;   IR LUMBAR DISC ASPIRATION W/IMG GUIDE  01/12/2020   KNEE ARTHROSCOPY Left    KNEE SURGERY     Nuclear Stress Test  05/20/2006   No ischemia   PARTIAL HYSTERECTOMY     PLANTAR FASCIA SURGERY     TONSILLECTOMY      Family History  Problem Relation Age of Onset   Cancer Mother        breast and colon cancer   Hypertension Mother    Clotting disorder Mother    Breast cancer Mother 41   Arthritis Mother    Stroke Mother    Diabetes Mother    Colon cancer Mother 54 - 55   Alcohol  abuse Father    Cancer Sister        breast cancer   Multiple sclerosis Sister    Cancer Sister        colon cancer   Cancer Brother        colon cancer   Prostate cancer Half-Brother    Stomach cancer Half-Brother    Breast cancer Half-Sister 70       recurrence at 48, reports positive genetic testing   Arthritis Half-Sister    Diabetes Half-Sister    Colon cancer Half-Sister 46   Allergies Other        grandson   Allergic rhinitis Neg Hx    Angioedema Neg Hx    Asthma Neg Hx    Atopy Neg Hx    Eczema Neg Hx    Immunodeficiency Neg Hx    Urticaria Neg Hx     Social History:  reports that she has never smoked. She has never been exposed to tobacco smoke. She has never used smokeless tobacco. She reports that she does not currently use alcohol . She reports that she does not use drugs.  Allergies:  Allergies  Allergen Reactions   Dilantin [Phenytoin] Swelling    facial swelling   Latex Hives   Oxycodone  Nausea And Vomiting and Nausea Only     Abdominal Pain, Vomiting   Penicillins Nausea And Vomiting and Swelling    Has patient had a PCN reaction causing immediate rash, facial/tongue/throat swelling, SOB or lightheadedness with hypotension patient had a PCN reaction causing severe rash involving mucus membranes or skin necrosis: [id number]} Has patient had a PCN reaction that required hospitalization/No Has patient had a PCN reaction occurring within the last 10 years: No If all of the above answers are NO, then may proceed with Cephalosporin use.  Codeine  Nausea Only    Tolerates tylenol  with codeine    Hydrocodone  Nausea And Vomiting   Nickel Hives and Rash   Pregabalin Itching and Other (See Comments)    headaches/problem w/vision    Medications: I have reviewed the patient's current medications.  The PMH, PSH, Medications, Allergies, and SH were reviewed and updated.  ROS: Constitutional: Negative for fever, weight loss and weight gain. Cardiovascular: Negative for chest pain and dyspnea on exertion. Respiratory: Is not experiencing shortness of breath at rest. Gastrointestinal: Negative for nausea and vomiting. Neurological: Negative for headaches. Psychiatric: The patient is not nervous/anxious  Blood pressure (!) 160/76, pulse 91, SpO2 94%. There is no height or weight on file to calculate BMI.  PHYSICAL EXAM:  Exam: General: Well-developed, well-nourished Communication and Voice: Clear pitch and clarity Respiratory Respiratory effort: Equal inspiration and expiration without stridor Cardiovascular Peripheral Vascular: Warm extremities with equal color/perfusion Eyes: No nystagmus with equal extraocular motion bilaterally Neuro/Psych/Balance: Patient oriented to person, place, and time; Appropriate mood and affect; Gait is intact with no imbalance; Cranial nerves I-XII are intact Head and Face Inspection: Normocephalic and atraumatic without mass or lesion Palpation: Facial skeleton intact without  bony stepoffs Salivary Glands: No mass or tenderness Facial Strength: Facial motility symmetric and full bilaterally ENT Pinna: External ear intact and fully developed External canal: Canal is patent with intact skin Tympanic Membrane: Clear and mobile External Nose: No scar or anatomic deformity Internal Nose: Septum is deviated to the left. No polyp, or purulence. Mucosal edema and erythema present.  Bilateral inferior turbinate hypertrophy.  Lips, Teeth, and gums: Mucosa and teeth intact and viable TMJ: No pain to palpation with full mobility Oral cavity/oropharynx: No erythema or exudate, no lesions present Nasopharynx: No mass or lesion with intact mucosa Hypopharynx: Intact mucosa without pooling of secretions Larynx Glottic: Full true vocal cord mobility without lesion or mass Supraglottic: Normal appearing epiglottis and AE folds - epiglottis with few dilated blood vessels and minima blood along laryngeal surface, source of hemoptysis Interarytenoid Space: Moderate pachydermia&edema Subglottic Space: Patent without lesion or edema Neck Neck and Trachea: Midline trachea without mass or lesion Thyroid : No mass or nodularity Lymphatics: No lymphadenopathy  Procedure: Preoperative diagnosis: dysphagia   Postoperative diagnosis:   Same + GERD LPR minimal blood and blood vessel as a source of bleeding along the laryngeal aspect of epiglottis, no active bleeding   Procedure: Flexible fiberoptic laryngoscopy  Surgeon: Elena Larry, MD  Anesthesia: Topical lidocaine  and Afrin Complications: None Condition is stable throughout exam  Indications and consent:  The patient presents to the clinic with above symptoms. Indirect laryngoscopy view was incomplete. Thus it was recommended that they undergo a flexible fiberoptic laryngoscopy. All of the risks, benefits, and potential complications were reviewed with the patient preoperatively and verbal informed consent was  obtained.  Procedure: The patient was seated upright in the clinic. Topical lidocaine  and Afrin were applied to the nasal cavity. After adequate anesthesia had occurred, I then proceeded to pass the flexible telescope into the nasal cavity. The nasal cavity was patent without rhinorrhea or polyp. The nasopharynx was also patent without mass or lesion. The base of tongue was visualized and was normal. There were no signs of pooling of secretions in the piriform sinuses. The true vocal folds were mobile bilaterally. There were no signs of glottic or supraglottic mucosal lesion or mass. There was moderate interarytenoid pachydermia and post cricoid edema. The telescope was then slowly withdrawn and the patient tolerated the procedure  throughout.      Studies Reviewed: MBS 10/03/23 Clinical Impression: Patient presents with mild pharyngeal dysphagia mostly characterized by inadequate laryngeal elevation and closure resulting in minimal trace aspiration of thin liquids on an inconsistent basis.  Chin tuck posture not helpful as it allowed barium to spill into open larynx during the swallow.   Using thin liquid to wash down food worsened penetration and aspiration.  Though patient does not sense trace aspiration cued cough is effective to help her to clear.  Pharyngeal swallow is strong fortunately.  Trace retention with liquids at vallecula and piriform sinus likely due to pressure issue from known esophageal deficits.     Patient denies any issues with pulmonary infections.  No aspiration or deep penetration of any other consistencies.  Patient was tested both with thin and ultrathin (thin barium halfed with water) -and no significant difference with airway protection noted.     Advised patient if consumption of coffee with large amounts of cream or slightly thicker liquids such as orange juice or Ensure, Glucerna are more comfortable for her to swallow -she may wish to drink these during her meals.   Encouraged her to drink water to help with hydration and advised that water is pH neutral thus safest liquid to aspirate.  She endorses taking her medications with applesauce for ease of swallow.  Recommended that if she does that to start intake with liquid and drink liquids after swallowing medications with applesauce.  Thank you so much for this consult.    Esophagram 10/03/23 IMPRESSION: Mild-to-moderate esophageal dysmotility.   Otherwise unremarkable exam.  Assessment/Plan: Encounter Diagnoses  Name Primary?   Pharyngeal dysphagia    Chronic GERD    Chronic nasal congestion    Esophageal dysmotility    Chronic throat clearing Yes    Assessment and Plan Assessment & Plan Chronic throat clearing  Gastroesophageal reflux disease with esophageal dysmotility on swallow study, as well as pharyngeal dysphagia with trace aspiration of thins. Flexible scope exam with GERD LPR findings, but no pooling of secretions and closure of the VF is complete. Already on PPI and H2 blocker for GERD LPR - Continue current regimen of Pantoprazole  and Pepcid   -  Reflux Gourmet after meals - diet and lifestyle changes to minimize GERD - Refer to [employer] blog for dietary and lifestyle modifications/reflux cook book  Blood vessel rupture along laryngeal surface of epiglottis, minimal hemoptysis now resolved Likely due to anticoagulation with Xarelto  and forceful coughing. - Reassured about minimal bleeding. - Advised to continue current medications.  Pharyngeal dysphagia  - swallow therapy referral sent    Thank you for allowing me to participate in the care of this patient. Please do not hesitate to contact me with any questions or concerns.   Elena Larry, MD Otolaryngology [facility] Phone: [phone] Fax: [phone]    02/07/2024, 10:16 AM

## 2024-02-07 NOTE — Telephone Encounter (Signed)
 Copied from CRM [id number]. Topic: General - Other >> Feb 07, 2024  1:42 PM [place] J wrote: Reason for CRM: Patient would like a phone call from Arland in regards to follow-up questions she has.

## 2024-02-11 ENCOUNTER — Ambulatory Visit (INDEPENDENT_AMBULATORY_CARE_PROVIDER_SITE_OTHER)

## 2024-02-11 DIAGNOSIS — J4550 Severe persistent asthma, uncomplicated: Secondary | ICD-10-CM

## 2024-02-12 ENCOUNTER — Ambulatory Visit

## 2024-02-19 ENCOUNTER — Ambulatory Visit

## 2024-02-21 DIAGNOSIS — K63829 Intestinal methanogen overgrowth, unspecified: Secondary | ICD-10-CM | POA: Diagnosis not present

## 2024-02-24 ENCOUNTER — Other Ambulatory Visit: Payer: Self-pay | Admitting: Allergy and Immunology

## 2024-02-27 ENCOUNTER — Ambulatory Visit (INDEPENDENT_AMBULATORY_CARE_PROVIDER_SITE_OTHER): Admitting: Podiatry

## 2024-02-27 ENCOUNTER — Ambulatory Visit: Payer: Self-pay

## 2024-02-27 ENCOUNTER — Encounter: Payer: Self-pay | Admitting: Podiatry

## 2024-02-27 DIAGNOSIS — L84 Corns and callosities: Secondary | ICD-10-CM

## 2024-02-27 DIAGNOSIS — B351 Tinea unguium: Secondary | ICD-10-CM

## 2024-02-27 DIAGNOSIS — M79676 Pain in unspecified toe(s): Secondary | ICD-10-CM

## 2024-02-27 DIAGNOSIS — E1142 Type 2 diabetes mellitus with diabetic polyneuropathy: Secondary | ICD-10-CM

## 2024-02-27 NOTE — Telephone Encounter (Addendum)
 FYI Only or Action Required?: FYI only for provider.  Patient was last seen in primary care on 01/31/2024 by Avelina Greig BRAVO, MD.  Called Nurse Triage reporting Abdominal Pain (severe).  Symptoms began today.  Called pt and LM on VM to have nothing to eat or drink.  Interventions attempted: Nothing.  Symptoms are: unchanged.  Triage Disposition: Go to ED Now (Notify PCP)  Patient/caregiver understands and will follow disposition?: yes      Copied from CRM [id number]. Topic: Clinical - Red Word Triage >> Feb 27, 2024  9:42 AM Rosina BIRCH wrote: Reason for RMF:dunfjry pain started today Reason for Disposition  [1] SEVERE pain AND [2] age > 60 years  Answer Assessment - Initial Assessment Questions 1. LOCATION: Where does it hurt?      abdomen 2. RADIATION: Does the pain shoot anywhere else? (e.g., chest, back)     no 3. ONSET: When did the pain begin? (e.g., minutes, hours or days ago)      Early this am  4. SUDDEN: Gradual or sudden onset?     sudden 6. SEVERITY: How bad is the pain?  (e.g., Scale 1-10; mild, moderate, or severe)     9/110  8. CAUSE: What do you think is causing the stomach pain? (e.g., gallstones, recent abdominal surgery)     constipation 10. OTHER SYMPTOMS: Do you have any other symptoms? (e.g., back pain, diarrhea, fever, urination pain, vomiting)       Stomach feels hard  Protocols used: Abdominal Pain - Female-A-AH

## 2024-02-27 NOTE — Telephone Encounter (Signed)
 Agree with ED disposition.

## 2024-02-27 NOTE — Progress Notes (Signed)
 Subjective:  Patient ID: [name], female    DOB: October 30, 1945,  MRN: [id number]  OLIVEA Price presents to clinic today for at risk foot care with history of diabetic neuropathy and preulcerative lesion(s) of both feet and painful mycotic toenails that limit ambulation. Painful toenails interfere with ambulation. Aggravating factors include wearing enclosed shoe gear. Pain is relieved with periodic professional debridement. Painful preulcerative lesion(s) is/are aggravated when weightbearing with and without shoegear. Pain is relieved with periodic professional debridement. Patient states her Chief Complaint  Patient presents with   [facility]    Rm1 Diabetic foot care Dr. Avelina Last visit July 2025 A1C 5.8   New problem(s): None.   PCP is Alicia Greig BRAVO, MD.  Allergies  Allergen Reactions   Dilantin [Phenytoin] Swelling    facial swelling   Latex Hives   Oxycodone  Nausea And Vomiting and Nausea Only    Abdominal Pain, Vomiting   Penicillins Nausea And Vomiting and Swelling    Has patient had a PCN reaction causing immediate rash, facial/tongue/throat swelling, SOB or lightheadedness with hypotension patient had a PCN reaction causing severe rash involving mucus membranes or skin necrosis: [id number]} Has patient had a PCN reaction that required hospitalization/No Has patient had a PCN reaction occurring within the last 10 years: No If all of the above answers are NO, then may proceed with Cephalosporin use.      Codeine  Nausea Only    Tolerates tylenol  with codeine    Hydrocodone  Nausea And Vomiting   Nickel Hives and Rash   Pregabalin Itching and Other (See Comments)    headaches/problem w/vision    Review of Systems: Negative except as noted in the HPI.  Objective: No changes noted in today's physical examination. There were no vitals filed for this visit. [name] is a pleasant 78 y.o. female WD, WN in NAD. AAO x 3.  Vascular Examination: Capillary refill  time immediate b/l. Vascular status intact b/l with palpable pedal pulses. Pedal hair sparse. No edema. No pain with calf compression b/l. Skin temperature gradient WNL b/l. No cyanosis or clubbing b/l. No ischemia or gangrene noted b/l LE.  Neurological Examination: Sensation grossly intact b/l with 10 gram monofilament. Vibratory sensation intact b/l. Pt has subjective symptoms of neuropathy.  Dermatological Examination: Flattened blister dorsal aspect left foot with no surrounding erythema, no edema, no drainage. No open wounds. Pedal skin with normal turgor, texture and tone b/l.  No interdigital macerations.   Toenails 2-5 b/l thick, discolored, elongated with subungual debris and pain on dorsal palpation.   Anonychia noted bilateral great toes. Nailbed(s) epithelialized. Preulcerative lesions medial IPJ b/l great toes, submet head 2 left foot, submet head 5 b/l, sub 5th met base right lower extremity. No erythema, no edema, no drainage, no fluctuance.  Musculoskeletal Examination: Muscle strength 5/5 to all lower extremity muscle groups bilaterally. Plantarflexed metatarsal(s) 5th metatarsal head b/l lower extremities.  Radiographs: None       Assessment/Plan: 1. Pain due to onychomycosis of toenail   2. Pre-ulcerative calluses   3. Diabetic polyneuropathy associated with type 2 diabetes mellitus [name])     Consent given for treatment. All patient's and/or POA's questions/concerns addressed on today's visit. Reviewed ABIs with patient. Blister dorsal aspect left forefoot subacute and resolving. Monitor. See Dr. Silva for any changes. Toenails 1-5 debrided in length and girth without incident. Preulcerative lesion(s) submet head 2 left foot, submet head 5 b/l, and sub 5th met base right foot pared with sharp debridement without  incident. Continue foot and shoe inspections daily. Monitor blood glucose per PCP/Endocrinologist's recommendations.Continue soft, supportive shoe gear  daily. Report any pedal injuries to medical professional. Offloaded callus(es)/porokeratotic lesion(s) submet head 2 left foot, submet head 5 right foot, and sub 5th met base right foot with felt dancers pad applied to insole(s) of shoe. Patient/POA to call should there be question/concern in the interim.  Return in about 3 months (around 05/29/2024).  [name], DPM      [facility] LOCATION: [address] [place] [address]                   Office [phone]   [facility] LOCATION: [address] [place], [place] [address] Office [phone]

## 2024-02-28 ENCOUNTER — Other Ambulatory Visit: Payer: Self-pay | Admitting: Family Medicine

## 2024-03-02 NOTE — Telephone Encounter (Signed)
 Last office visit 01/31/24 for insomnia and gait problem.  Last refilled :  Tramadol  not on current medication list.  Next Appt: No future appointments with PCP.

## 2024-03-03 ENCOUNTER — Ambulatory Visit

## 2024-03-03 ENCOUNTER — Ambulatory Visit: Admitting: Family Medicine

## 2024-03-03 DIAGNOSIS — Z96651 Presence of right artificial knee joint: Secondary | ICD-10-CM | POA: Diagnosis not present

## 2024-03-03 DIAGNOSIS — M1712 Unilateral primary osteoarthritis, left knee: Secondary | ICD-10-CM | POA: Diagnosis not present

## 2024-03-06 ENCOUNTER — Encounter: Payer: Self-pay | Admitting: Family Medicine

## 2024-03-06 ENCOUNTER — Ambulatory Visit (INDEPENDENT_AMBULATORY_CARE_PROVIDER_SITE_OTHER): Admitting: Family Medicine

## 2024-03-06 VITALS — BP 146/84 | HR 62 | Temp 97.3°F | Ht 63.5 in | Wt 201.0 lb

## 2024-03-06 DIAGNOSIS — R051 Acute cough: Secondary | ICD-10-CM | POA: Diagnosis not present

## 2024-03-06 DIAGNOSIS — M1711 Unilateral primary osteoarthritis, right knee: Secondary | ICD-10-CM

## 2024-03-06 LAB — POC COVID19 BINAXNOW: SARS Coronavirus 2 Ag: NEGATIVE

## 2024-03-06 NOTE — Assessment & Plan Note (Signed)
 Acute, most likely upper respiratory tract viral infection.  No clear sign of bacterial infection at this time.  Very slight asthma exacerbation given using nebulizer 2-3 times a week.  She states in the last 48 hours she has felt significantly better. Will check COVID test to rule out.  Recommend continue allergy medication can restart Mucinex DM twice daily if needed.  Return to ER precautions provided.

## 2024-03-06 NOTE — Assessment & Plan Note (Signed)
 Acute worsening of chronic issue, currently seeing orthopedics.  Using Voltaren  gel 4 times a day for pain and swelling.

## 2024-03-06 NOTE — Addendum Note (Signed)
 Addended by: NARCISO ANDREZ BROCKS on: 03/06/2024 11:26 AM   Modules accepted: Orders

## 2024-03-06 NOTE — Progress Notes (Signed)
 Patient ID: [name], female    DOB: June 15, 1946, 78 y.o.   MRN: [id number]  This visit was conducted in person.  BP (!) 146/84   Pulse 62   Temp (!) 97.3 F (36.3 C) (Temporal)   Ht 5' 3.5 (1.613 m)   Wt 201 lb (91.2 kg)   SpO2 98%   BMI 35.05 kg/m    CC:  Chief Complaint  Patient presents with   Cough    Dry cough and runny nose x 1 week  Denies any other symptoms    Subjective:   HPI: [name] is a 78 y.o. female presenting on 03/06/2024 for Cough (Dry cough and runny nose x 1 week /Denies any other symptoms)   Has started Voltaren  gel on right leg for pain  Date of onset:  1 week Initial symptoms included  runny nose Symptoms progressed to dry cough  Some slight wheeze and SOB.  No fever  No body aches. No flu like symptoms   She has noted less symptoms in the last 24 hours.  Sick contacts:  none COVID testing:   none     She has tried to treat with  ipratropium 2 sprays BID... does not seem to help much  Has been using nebulizer more.SABRA 2-3 times a week  ON Advair  controlled.  On clarinex  and singulair  daily.    She has history of moderate persistent asthma, history of pulmonary embolism and paroxysmal atrial fibrillation, hypertension Non-smoker.       Relevant past medical, surgical, family and social history reviewed and updated as indicated. Interim medical history since our last visit reviewed. Allergies and medications reviewed and updated. Outpatient Medications Prior to Visit  Medication Sig Dispense Refill   ADVAIR  HFA 230-21 MCG/ACT inhaler Inhale 2 puffs into the lungs 2 (two) times daily. On empty lungs 36 g 5   albuterol  (PROVENTIL ) (2.5 MG/3ML) 0.083% nebulizer solution Take 3 mLs (2.5 mg total) by nebulization every 6 (six) hours as needed for wheezing or shortness of breath. 150 mL 1   albuterol  (VENTOLIN  HFA) 108 (90 Base) MCG/ACT inhaler Inhale 2 puffs into the lungs every 6 (six) hours as needed for wheezing or  shortness of breath (Take with Flovent  During Flare Ups.). INHALE 2 PUFFS BY MOUTH EVERY 6 HOURS AS NEEDED FOR WHEEZING OR SHORTNESS OF BREATH 18 g 1   anastrozole  (ARIMIDEX ) 1 MG tablet TAKE 1 TABLET(1 MG) BY MOUTH DAILY 90 tablet 3   atorvastatin  (LIPITOR) 10 MG tablet TAKE 1 TABLET(10 MG) BY MOUTH DAILY 90 tablet 3   Blood Glucose Monitoring Suppl (ONETOUCH VERIO) w/Device KIT Use to check blood sugar up to 2 times a day 1 kit 0   budesonide  (PULMICORT ) 0.5 MG/2ML nebulizer solution Take 2 mLs (0.5 mg total) by nebulization in the morning and at bedtime. 120 mL 0   desloratadine  (CLARINEX ) 5 MG tablet TAKE 1 TABLET(5 MG) BY MOUTH DAILY 90 tablet 1   Empagliflozin -linaGLIPtin  (GLYXAMBI ) 10-5 MG TABS TAKE 1 TABLET BY MOUTH DAILY 30 tablet 2   EPINEPHrine  0.3 mg/0.3 mL IJ SOAJ injection Inject 0.3 mg into the muscle as needed for anaphylaxis. USE AS DIRECTED FOR LIFE THREATENING ALLERGIC REACTIONS 2 each 1   estradiol  (ESTRACE ) 0.1 MG/GM vaginal cream Place 1 Applicatorful vaginally 2 (two) times a week. (As Needed)     famotidine  (PEPCID ) 40 MG tablet Take 1 tablet (40 mg total) by mouth at bedtime. Take 1 (ONE) tablet by mouth every  evening. 90 tablet 1   fluticasone  (FLOVENT  HFA) 220 MCG/ACT inhaler Inhale 2 puffs into the lungs every 6 (six) hours as needed. Use with Albuterol  during flare ups. 36 g 1   gabapentin  (NEURONTIN ) 300 MG capsule TAKE 1 CAPSULE BY MOUTH IN THE MORNING, 1 CAPSULE AT LUNCH AND 2 CAPSULES AT BEDTIME 360 capsule 3   glucose blood (ONETOUCH VERIO) test strip 1 strip 2 (two) times daily     hydrOXYzine  (ATARAX ) 10 MG tablet Take 1 tablet (10 mg total) by mouth daily as needed for itching. 30 tablet 2   ipratropium (ATROVENT ) 0.06 % nasal spray Place 2 sprays into both nostrils 2 (two) times daily as needed for rhinitis. 45 mL 1   irbesartan  (AVAPRO ) 150 MG tablet TAKE 1 TABLET(150 MG) BY MOUTH DAILY 90 tablet 1   Lactase (DAIRY-RELIEF PO) Take 1 capsule by mouth daily as  needed (eating dairy).     Lancets (ONETOUCH DELICA PLUS LANCET33G) MISC USE TO CHECK BLOOD SUGAR UP TO TWICE DAILY 100 each 5   Magnesium 500 MG TABS Take 500 mg by mouth 2 (two) times daily.     Menthol , Topical Analgesic, (ABSORBINE PLUS JR EX) Apply 1 patch topically daily as needed (pain).     Menthol -Methyl Salicylate (SALONPAS JET SPRAY EX) Apply 1 spray topically daily as needed (knee pain).     methimazole  (TAPAZOLE ) 5 MG tablet Take 1 tablet (5 mg total) by mouth daily. 90 tablet 1   montelukast  (SINGULAIR ) 10 MG tablet Take 1 tablet (10 mg total) by mouth at bedtime. For asthma control. 90 tablet 1   Mouthwashes (BIOTENE DRY MOUTH MT) Use as directed 1 Dose in the mouth or throat daily as needed (dry mouth).     Nebulizer MISC 1 Device by Does not apply route as directed. 1 each 1   ondansetron  (ZOFRAN -ODT) 4 MG disintegrating tablet Take 1 tablet (4 mg total) by mouth every 8 (eight) hours as needed for nausea or vomiting. 20 tablet 0   pantoprazole  (PROTONIX ) 40 MG tablet Take 1 tablet (40 mg total) by mouth 2 (two) times daily. 180 tablet 1   Plecanatide (TRULANCE) 3 MG TABS Take 3 mg by mouth daily as needed (constipation).     Polyethyl Glycol-Propyl Glycol (SYSTANE OP) Place 1 drop into both eyes daily as needed (dry eyes).     polyethylene glycol powder (GLYCOLAX /MIRALAX ) 17 GM/SCOOP powder Take 1 Container by mouth once.     povidone-iodine  (BETADINE ) 10 % external solution Apply 1 Application topically as needed for wound care. 480 mL 0   PRESCRIPTION MEDICATION Inhale into the lungs at bedtime. CPAP     Respiratory Therapy Supplies (NEBULIZER MASK ADULT) MISC 1 kit by Does not apply route as directed. 1 each 1   rivaroxaban  (XARELTO ) 20 MG TABS tablet Take 1 tablet (20 mg total) by mouth daily with supper. 90 tablet 1   traMADol  (ULTRAM ) 50 MG tablet Take 1 tablet (50 mg total) by mouth every 8 (eight) hours as needed for up to 5 days. 30 tablet 0   triamcinolone  (NASACORT ) 55  MCG/ACT AERO nasal inhaler Place 2 sprays into the nose daily. 50.7 mL 1   Vibegron  (GEMTESA ) 75 MG TABS 1 tablet daily 30 tablet 0   cyclobenzaprine  (FLEXERIL ) 10 MG tablet Take 0.5-1 tablets (5-10 mg total) by mouth at bedtime as needed for muscle spasms. (Patient not taking: Reported on 03/06/2024) 20 tablet 0   Facility-Administered Medications Prior to Visit  Medication  Dose Route Frequency Provider Last Rate Last Admin   tezepelumab -ekko (TEZSPIRE ) 210 MG/1. syringe 210 mg  210 mg Subcutaneous Q28 days Kozlow, Eric J, MD   210 mg at 02/11/24 0915     Per HPI unless specifically indicated in ROS section below Review of Systems  Constitutional:  Negative for fatigue and fever.  HENT:  Negative for congestion, ear discharge, ear pain, sinus pressure, sinus pain and sore throat.   Eyes:  Negative for pain and redness.  Respiratory:  Positive for cough. Negative for shortness of breath.   Cardiovascular:  Negative for chest pain, palpitations and leg swelling.  Gastrointestinal:  Negative for abdominal pain.  Genitourinary:  Negative for dysuria and vaginal bleeding.  Musculoskeletal:  Negative for back pain.  Neurological:  Negative for syncope, light-headedness and headaches.  Psychiatric/Behavioral:  Negative for dysphoric mood.    Objective:  BP (!) 146/84   Pulse 62   Temp (!) 97.3 F (36.3 C) (Temporal)   Ht 5' 3.5 (1.613 m)   Wt 201 lb (91.2 kg)   SpO2 98%   BMI 35.05 kg/m   Wt Readings from Last 3 Encounters:  03/06/24 201 lb (91.2 kg)  01/31/24 197 lb 6 oz (89.5 kg)  01/07/24 193 lb (87.5 kg)      Physical Exam Constitutional:      General: She is not in acute distress.    Appearance: Normal appearance. She is well-developed. She is not ill-appearing or toxic-appearing.  HENT:     Head: Normocephalic.     Right Ear: Hearing, tympanic membrane, ear canal and external ear normal. Tympanic membrane is not erythematous, retracted or bulging.     Left Ear:  Hearing, tympanic membrane, ear canal and external ear normal. Tympanic membrane is not erythematous, retracted or bulging.     Nose: No mucosal edema or rhinorrhea.     Right Sinus: No maxillary sinus tenderness or frontal sinus tenderness.     Left Sinus: No maxillary sinus tenderness or frontal sinus tenderness.     Mouth/Throat:     Pharynx: Uvula midline.  Eyes:     General: Lids are normal. Lids are everted, no foreign bodies appreciated.     Conjunctiva/sclera: Conjunctivae normal.     Pupils: Pupils are equal, round, and reactive to light.  Neck:     Thyroid : No thyroid  mass or thyromegaly.     Vascular: No carotid bruit.     Trachea: Trachea normal.  Cardiovascular:     Rate and Rhythm: Normal rate. Rhythm regularly irregular.     Pulses: Normal pulses.     Heart sounds: S1 normal and S2 normal. Murmur heard.     No friction rub. No gallop.  Pulmonary:     Effort: Pulmonary effort is normal. No tachypnea or respiratory distress.     Breath sounds: Normal breath sounds. No decreased breath sounds, wheezing, rhonchi or rales.  Abdominal:     General: Bowel sounds are normal.     Palpations: Abdomen is soft.     Tenderness: There is no abdominal tenderness.  Musculoskeletal:     Cervical back: Normal range of motion and neck supple.     Right lower leg: No edema.     Left lower leg: No edema.  Skin:    General: Skin is warm and dry.     Findings: No rash.  Neurological:     Mental Status: She is alert.  Psychiatric:        Mood and  Affect: Mood is not anxious or depressed.        Speech: Speech normal.        Behavior: Behavior normal. Behavior is cooperative.        Thought Content: Thought content normal.        Judgment: Judgment normal.       Results for orders placed or performed in visit on 01/31/24  IBC + Ferritin   Collection Time: 01/31/24  4:03 PM  Result Value Ref Range   Iron 54 42 - 145 ug/dL   Transferrin 713.9 787.9 - 360.0 mg/dL   Saturation  Ratios 86.4 (L) 20.0 - 50.0 %   Ferritin 16.9 10.0 - 291.0 ng/mL   TIBC 400.4 250.0 - 450.0 mcg/dL   *Note: Due to a large number of results and/or encounters for the requested time period, some results have not been displayed. A complete set of results can be found in Results Review.    Assessment and Plan  Acute cough Assessment & Plan: Acute, most likely upper respiratory tract viral infection.  No clear sign of bacterial infection at this time.  Very slight asthma exacerbation given using nebulizer 2-3 times a week.  She states in the last 48 hours she has felt significantly better. Will check COVID test to rule out.  Recommend continue allergy medication can restart Mucinex DM twice daily if needed.  Return to ER precautions provided.   Osteoarthritis of right knee, unspecified osteoarthritis type Assessment & Plan: Acute worsening of chronic issue, currently seeing orthopedics.  Using Voltaren  gel 4 times a day for pain and swelling.     No follow-ups on file.   Greig Ring, MD

## 2024-03-10 ENCOUNTER — Telehealth: Payer: Self-pay | Admitting: *Deleted

## 2024-03-10 ENCOUNTER — Telehealth: Payer: Self-pay | Admitting: Allergy and Immunology

## 2024-03-10 ENCOUNTER — Ambulatory Visit (INDEPENDENT_AMBULATORY_CARE_PROVIDER_SITE_OTHER)

## 2024-03-10 DIAGNOSIS — J4550 Severe persistent asthma, uncomplicated: Secondary | ICD-10-CM

## 2024-03-10 MED ORDER — FLUCONAZOLE 150 MG PO TABS: 150.0000 mg | ORAL_TABLET | Freq: Once | ORAL | 0 refills | Status: AC

## 2024-03-10 NOTE — Telephone Encounter (Signed)
 Spoke with Ms. Disla.  She had a dental procedure and had to take azithromycin  250 mg x 2 before appointment.  She is requesting Diflucan  for yeast.  [facility] on [address].

## 2024-03-10 NOTE — Addendum Note (Signed)
 Addended by: AVELINA NO E on: 03/10/2024 03:07 PM   Modules accepted: Orders

## 2024-03-10 NOTE — Telephone Encounter (Signed)
 Copied from CRM [id number]. Topic: Clinical - Medication Question >> Mar 10, 2024 12:18 PM Sasha M wrote: Reason for CRM: pt had dental procedure today and took meds prior to appt. dentist cannot prescribe yeast infection meds and told her to contact her pcp

## 2024-03-10 NOTE — Telephone Encounter (Signed)
 PT requested that Kozlow call her to discuss 09/02 appt as she is confused - I had advised her that appt was a 3 month f/u with NP, Cheryl, and she requested to speak with Kozlow

## 2024-03-11 NOTE — Telephone Encounter (Signed)
 Patient called our [place] office and I clarified the reasoning for her 12 week follow up. She prefers to see Dr. Maurilio so I rescheduled her to an opening he had for 9/2 at 1:45pm.

## 2024-03-17 ENCOUNTER — Ambulatory Visit (INDEPENDENT_AMBULATORY_CARE_PROVIDER_SITE_OTHER): Admitting: Allergy and Immunology

## 2024-03-17 ENCOUNTER — Ambulatory Visit: Admitting: Family

## 2024-03-17 VITALS — BP 140/82 | HR 66 | Temp 98.3°F | Resp 14 | Ht 63.0 in | Wt 195.9 lb

## 2024-03-17 DIAGNOSIS — T781XXD Other adverse food reactions, not elsewhere classified, subsequent encounter: Secondary | ICD-10-CM

## 2024-03-17 DIAGNOSIS — K219 Gastro-esophageal reflux disease without esophagitis: Secondary | ICD-10-CM

## 2024-03-17 DIAGNOSIS — J4550 Severe persistent asthma, uncomplicated: Secondary | ICD-10-CM

## 2024-03-17 DIAGNOSIS — Z91018 Allergy to other foods: Secondary | ICD-10-CM

## 2024-03-17 DIAGNOSIS — T781XXA Other adverse food reactions, not elsewhere classified, initial encounter: Secondary | ICD-10-CM

## 2024-03-17 DIAGNOSIS — J3089 Other allergic rhinitis: Secondary | ICD-10-CM

## 2024-03-17 MED ORDER — MONTELUKAST SODIUM 10 MG PO TABS: 10.0000 mg | ORAL_TABLET | Freq: Every day | ORAL | 1 refills | Status: DC

## 2024-03-17 MED ORDER — FAMOTIDINE 40 MG PO TABS: 40.0000 mg | ORAL_TABLET | Freq: Every evening | ORAL | 1 refills | Status: AC

## 2024-03-17 MED ORDER — ALBUTEROL SULFATE HFA 108 (90 BASE) MCG/ACT IN AERS: 2.0000 | INHALATION_SPRAY | Freq: Four times a day (QID) | RESPIRATORY_TRACT | 1 refills | Status: DC | PRN

## 2024-03-17 MED ORDER — ADVAIR HFA 230-21 MCG/ACT IN AERO: 2.0000 | INHALATION_SPRAY | Freq: Two times a day (BID) | RESPIRATORY_TRACT | 1 refills | Status: AC

## 2024-03-17 MED ORDER — ALBUTEROL SULFATE (2.5 MG/3ML) 0.083% IN NEBU: 2.5000 mg | INHALATION_SOLUTION | Freq: Four times a day (QID) | RESPIRATORY_TRACT | 1 refills | Status: DC | PRN

## 2024-03-17 MED ORDER — EPINEPHRINE 0.3 MG/0.3ML IJ SOAJ: 0.3000 mg | INTRAMUSCULAR | 1 refills | Status: AC | PRN

## 2024-03-17 MED ORDER — PANTOPRAZOLE SODIUM 40 MG PO TBEC: 40.0000 mg | DELAYED_RELEASE_TABLET | Freq: Two times a day (BID) | ORAL | 1 refills | Status: AC

## 2024-03-17 NOTE — Patient Instructions (Signed)
  1. Continue to treat and prevent airway inflammation:  A.  Advair  230 - 2 inhalations 2 times a day (empty lungs)  B.  Montelukast  10mg  - 1 tablet 1 time per day.  C.  Tezepelumab  injections.    2. Continue to treat reflux / swallowing issue:  A. Pantoprazole  40mg  - 2 times per day B. Famotidine  40 mg in PM C. Replace throat clearing with swallowing / drinking maneuver  3. If needed:   A. Nasal saline several times per day  B. Albuterol  + Fluticasone  220 - 2 inhalations TOGETHER every 6 hours  C. Albuterol  neb + Fluticasone  220 - 2 inhalations TOGETHER every 6 hours  D. Epi-pen  4. Obtain Covid and Flu vaccine    5. Return to clinic in 12 weeks or earlier if there is a problem.  6. Influenza = Tamiflu. Covid = molnupiravir

## 2024-03-17 NOTE — Progress Notes (Unsigned)
 [place] - [place] - [place] - [place] - [place]   Follow-up Note  Referring Provider: Avelina Greig BRAVO, MD Primary Provider: Avelina Greig BRAVO, MD Date of Office Visit: 03/17/2024  Subjective:   [name] (DOB: [birth year]) is a 78 y.o. female who [facility] in re-evaluation of the following:  HPI: Shadi returns to this clinic in evaluation of asthma, history of pulmonary hypertension/diastolic dysfunction, allergic rhinitis, LPR, esophageal dysmotility, and food allergy directed against shellfish and fish.  I last saw her in this clinic 07 January 2024.   Overall she has done well with her respiratory track without the need for systemic steroid or an antibiotic although over the course of the past 2 weeks she did develop some coughing and sneezing for which she restarted Mucinex DM and restarted nebulized albuterol  and she is better.  She apparently did test herself for COVID which was negative.  Other than that event she believes that both her upper and lower airway been doing well and she rarely uses a short acting bronchodilator while she continues on a collection of anti-inflammatory agents for her airway including tezepelumab , Advair , montelukast .  And she believes that her reflux is under good control at this point in time using a combination of a proton pump inhibitor and H2 receptor blocker.  She has not really been having much problems with her throat.  She has not been having any classic reflux symptoms.  She did visit with ENT this spring and her rhinoscopic evaluation of her larynx was pretty good.  She has been treated for bacterial overgrowth of her GI tract by [facility].  Apparently she is going to be on 3 different medications over the course of the next month or so based upon a small bowel breath test.  She does not eat seafood.  Allergies as of 03/17/2024       Reactions   Dilantin [phenytoin] Swelling   facial  swelling   Latex Hives   Oxycodone  Nausea And Vomiting, Nausea Only   Abdominal Pain, Vomiting   Penicillins Nausea And Vomiting, Swelling   Has patient had a PCN reaction causing immediate rash, facial/tongue/throat swelling, SOB or lightheadedness with hypotension patient had a PCN reaction causing severe rash involving mucus membranes or skin necrosis: [id number] Has patient had a PCN reaction that required hospitalization/No Has patient had a PCN reaction occurring within the last 10 years: No If all of the above answers are NO, then may proceed with Cephalosporin use.   Codeine  Nausea Only   Tolerates tylenol  with codeine    Hydrocodone  Nausea And Vomiting   Nickel Hives, Rash   Pregabalin Itching, Other (See Comments)   headaches/problem w/vision        Medication List    ABSORBINE PLUS JR EX Apply 1 patch topically daily as needed (pain).   Advair  HFA 230-21 MCG/ACT inhaler Generic drug: fluticasone -salmeterol Inhale 2 puffs into the lungs 2 (two) times daily. On empty lungs   albuterol  108 (90 Base) MCG/ACT inhaler Commonly known as: VENTOLIN  HFA Inhale 2 puffs into the lungs every 6 (six) hours as needed for wheezing or shortness of breath (Take with Flovent  During Flare Ups.).   albuterol  (2.5 MG/3ML) 0.083% nebulizer solution Commonly known as: PROVENTIL  Take 3 mLs (2.5 mg total) by nebulization every 6 (six) hours as needed for wheezing or shortness of breath.   anastrozole  1 MG tablet Commonly known as: ARIMIDEX  TAKE 1 TABLET(1 MG) BY MOUTH  DAILY   atorvastatin  10 MG tablet Commonly known as: LIPITOR TAKE 1 TABLET(10 MG) BY MOUTH DAILY   BIOTENE DRY MOUTH MT Use as directed 1 Dose in the mouth or throat daily as needed (dry mouth).   budesonide  0.5 MG/2ML nebulizer solution Commonly known as: Pulmicort  Take 2 mLs (0.5 mg total) by nebulization in the morning and at bedtime.   cyclobenzaprine  10 MG tablet Commonly known as: FLEXERIL  Take 0.5-1  tablets (5-10 mg total) by mouth at bedtime as needed for muscle spasms.   DAIRY-RELIEF PO Take 1 capsule by mouth daily as needed (eating dairy).   desloratadine  5 MG tablet Commonly known as: CLARINEX  TAKE 1 TABLET(5 MG) BY MOUTH DAILY   EPINEPHrine  0.3 mg/0.3 mL Soaj injection Commonly known as: EPI-PEN Inject 0.3 mg into the muscle as needed for anaphylaxis. USE AS DIRECTED FOR LIFE THREATENING ALLERGIC REACTIONS   estradiol  0.1 MG/GM vaginal cream Commonly known as: ESTRACE  Place 1 Applicatorful vaginally 2 (two) times a week. (As Needed)   famotidine  40 MG tablet Commonly known as: PEPCID  Take 1 tablet (40 mg total) by mouth at bedtime. Take 1 (ONE) tablet by mouth every evening.   fluticasone  220 MCG/ACT inhaler Commonly known as: Flovent  HFA Inhale 2 puffs into the lungs every 6 (six) hours as needed. Use with Albuterol  during flare ups.   gabapentin  300 MG capsule Commonly known as: NEURONTIN  TAKE 1 CAPSULE BY MOUTH IN THE MORNING, 1 CAPSULE AT LUNCH AND 2 CAPSULES AT BEDTIME   Gemtesa  75 MG Tabs Generic drug: Vibegron  1 tablet daily   Glyxambi  10-5 MG Tabs Generic drug: Empagliflozin -linaGLIPtin  TAKE 1 TABLET BY MOUTH DAILY   hydrOXYzine  10 MG tablet Commonly known as: ATARAX  Take 1 tablet (10 mg total) by mouth daily as needed for itching.   ipratropium 0.06 % nasal spray Commonly known as: ATROVENT  Place 2 sprays into both nostrils 2 (two) times daily as needed for rhinitis.   irbesartan  150 MG tablet Commonly known as: AVAPRO  TAKE 1 TABLET(150 MG) BY MOUTH DAILY   Magnesium 500 MG Tabs Take 500 mg by mouth 2 (two) times daily.   methimazole  5 MG tablet Commonly known as: TAPAZOLE  Take 1 tablet (5 mg total) by mouth daily.   montelukast  10 MG tablet Commonly known as: SINGULAIR  Take 1 tablet (10 mg total) by mouth at bedtime. For asthma control.   Nebulizer Mask Adult Misc 1 kit by Does not apply route as directed.   Nebulizer Misc 1 Device  by Does not apply route as directed.   ondansetron  4 MG disintegrating tablet Commonly known as: ZOFRAN -ODT Take 1 tablet (4 mg total) by mouth every 8 (eight) hours as needed for nausea or vomiting.   OneTouch Delica Plus Lancet33G Misc USE TO CHECK BLOOD SUGAR UP TO TWICE DAILY   OneTouch Verio test strip Generic drug: glucose blood 1 strip 2 (two) times daily   OneTouch Verio w/Device Kit Use to check blood sugar up to 2 times a day   pantoprazole  40 MG tablet Commonly known as: Protonix  Take 1 tablet (40 mg total) by mouth 2 (two) times daily.   polyethylene glycol powder 17 GM/SCOOP powder Commonly known as: GLYCOLAX /MIRALAX  Take 1 Container by mouth once.   povidone-iodine  10 % external solution Commonly known as: Betadine  Apply 1 Application topically as needed for wound care.   PRESCRIPTION MEDICATION Inhale into the lungs at bedtime. CPAP   rivaroxaban  20 MG Tabs tablet Commonly known as: Xarelto  Take 1 tablet (20 mg  total) by mouth daily with supper.   SALONPAS JET SPRAY EX Apply 1 spray topically daily as needed (knee pain).   SYSTANE OP Place 1 drop into both eyes daily as needed (dry eyes).   triamcinolone  55 MCG/ACT Aero nasal inhaler Commonly known as: NASACORT  Place 2 sprays into the nose daily.   Trulance 3 MG Tabs Generic drug: Plecanatide Take 3 mg by mouth daily as needed (constipation).   Xifaxan 550 MG Tabs tablet Generic drug: rifaximin Take 550 mg by mouth 3 (three) times daily.    Past Medical History:  Diagnosis Date   Allergic rhinitis    Allergy    Arthritis    Asthma    Breast cancer (HCC)    Chronic headache    Diabetes mellitus    Ductal carcinoma in situ (DCIS) of left breast 02/23/2022   DVT (deep venous thrombosis) (HCC)    Dyspnea    Heart murmur    History of radiation therapy    Left Breast 05/02/22-05/30/22- Dr. Lynwood Nasuti   Hypertension    Penicillin allergy 01/19/2020   Pulmonary embolism (HCC)     Pulmonary hypertension (HCC)    Seizures (HCC)     Past Surgical History:  Procedure Laterality Date   ABDOMINAL HYSTERECTOMY     partial, has ovaries   BREAST LUMPECTOMY WITH RADIOACTIVE SEED LOCALIZATION Left 03/29/2022   Procedure: LEFT BREAST LUMPECTOMY WITH RADIOACTIVE SEED LOCALIZATION;  Surgeon: Vanderbilt Ned, MD;  Location: [facility] OR;  Service: General;  Laterality: Left;   CARDIAC CATHETERIZATION  12/28/2010   Mod. pulmonary hypertension, normal coronary arteries   CARDIOVERSION N/A 11/23/2020   Procedure: CARDIOVERSION;  Surgeon: Francyne Headland, MD;  Location: [facility];  Service: Cardiovascular;  Laterality: N/A;   CHOLECYSTECTOMY N/A 12/20/2021   Procedure: LAPAROSCOPIC CHOLECYSTECTOMY WITH INTRAOPERATIVE CHOLANGIOGRAM;  Surgeon: Eletha Boas, MD;  Location: [facility] ORS;  Service: General;  Laterality: N/A;   DOPPLER ECHOCARDIOGRAPHY  10/08/2011   EF=>55%,mild asymmetric LVH, mod. TR, mod. PH, mild to mod LA dilatation   ESOPHAGEAL MANOMETRY N/A 06/19/2023   Procedure: ESOPHAGEAL MANOMETRY (EM);  Surgeon: Saintclair Jasper, MD;  Location: [facility];  Service: Gastroenterology;  Laterality: N/A;   IR LUMBAR DISC ASPIRATION W/IMG GUIDE  01/12/2020   KNEE ARTHROSCOPY Left    KNEE SURGERY     Nuclear Stress Test  05/20/2006   No ischemia   PARTIAL HYSTERECTOMY     PLANTAR FASCIA SURGERY     TONSILLECTOMY      Review of systems negative except as noted in HPI / PMHx or noted below:  Review of Systems  Constitutional: Negative.   HENT: Negative.    Eyes: Negative.   Respiratory: Negative.    Cardiovascular: Negative.   Gastrointestinal: Negative.   Genitourinary: Negative.   Musculoskeletal: Negative.   Skin: Negative.   Neurological: Negative.   Endo/Heme/Allergies: Negative.   Psychiatric/Behavioral: Negative.       Objective:   Vitals:   03/17/24 1349  BP: (!) 140/82  Pulse: 66  Resp: 14  Temp: 98.3 F (36.8 C)  SpO2: 99%   Height: 5' 3 (160 cm)  Weight: 195 lb  14.4 oz (88.9 kg)   Physical Exam Constitutional:      Appearance: She is not diaphoretic.  HENT:     Head: Normocephalic.     Right Ear: Tympanic membrane, ear canal and external ear normal.     Left Ear: Tympanic membrane, ear canal and external ear normal.     Nose: Nose  normal. No mucosal edema or rhinorrhea.     Mouth/Throat:     Pharynx: Uvula midline. No oropharyngeal exudate.  Eyes:     Conjunctiva/sclera: Conjunctivae normal.  Neck:     Thyroid : No thyromegaly.     Trachea: Trachea normal. No tracheal tenderness or tracheal deviation.  Cardiovascular:     Rate and Rhythm: Normal rate and regular rhythm.     Heart sounds: S1 normal and S2 normal. Murmur (systolic) heard.  Pulmonary:     Effort: No respiratory distress.     Breath sounds: Normal breath sounds. No stridor. No wheezing or rales.  Lymphadenopathy:     Head:     Right side of head: No tonsillar adenopathy.     Left side of head: No tonsillar adenopathy.     Cervical: No cervical adenopathy.  Skin:    Findings: No erythema or rash.     Nails: There is no clubbing.  Neurological:     Mental Status: She is alert.     Diagnostics: Spirometry was performed and demonstrated an FEV1 of 1.57 at 95  % of predicted.  Results of a rhinoscopy performed 07 February 2024 by Dr. Okey identified the following:  The nasal cavity was patent without rhinorrhea or polyp. The nasopharynx was also patent without mass or lesion. The base of tongue was visualized and was normal. There were no signs of pooling of secretions in the piriform sinuses. The true vocal folds were mobile bilaterally. There were no signs of glottic or supraglottic mucosal lesion or mass. There was moderate interarytenoid pachydermia and post cricoid edema.   Assessment and Plan:   1. Severe persistent asthma without complication   2. Asthma, severe persistent, well-controlled   3. Other allergic rhinitis   4. LPRD (laryngopharyngeal reflux disease)    5. Food allergy   6. Adverse food reaction, initial encounter     1. Continue to treat and prevent airway inflammation:  A.  Advair  230 - 2 inhalations 2 times a day (empty lungs)  B.  Montelukast  10mg  - 1 tablet 1 time per day.  C.  Tezepelumab  injections.    2. Continue to treat reflux / swallowing issue:  A. Pantoprazole  40mg  - 2 times per day B. Famotidine  40 mg in PM C. Replace throat clearing with swallowing / drinking maneuver  3. If needed:   A. Nasal saline several times per day  B. Albuterol  + Fluticasone  220 - 2 inhalations TOGETHER every 6 hours  C. Albuterol  neb + Fluticasone  220 - 2 inhalations TOGETHER every 6 hours  D. Epi-pen  4. Obtain Covid and Flu vaccine    5. Return to clinic in 12 weeks or earlier if there is a problem.  6. Influenza = Tamiflu. Covid = molnupiravir  W[place] appears to be doing pretty well with her airway issue and with her reflux issue on her current plan and she will remain on a collection of anti-inflammatory agents including anti-TSLP antibody and continue to aggressively treat her reflux.  She has a selection of agents to be utilized should they be required as noted above.  I will see her back in this clinic in 12 weeks or earlier if there is a problem.   Camellia Denis, MD Allergy / Immunology  [facility]

## 2024-03-18 ENCOUNTER — Encounter: Payer: Self-pay | Admitting: Allergy and Immunology

## 2024-03-19 ENCOUNTER — Ambulatory Visit: Payer: Self-pay

## 2024-03-19 NOTE — Telephone Encounter (Signed)
 Appointment with Dr. Avelina 03/20/24

## 2024-03-19 NOTE — Telephone Encounter (Signed)
 FYI Only or Action Required?: FYI only for provider.  Patient was last seen in primary care on 03/06/2024 by Avelina Greig BRAVO, MD.  Called Nurse Triage reporting Arm Pain.  Symptoms began several days ago.  Interventions attempted: Ice/heat application.  Symptoms are: gradually improving.  Triage Disposition: See PCP When Office is Open (Within 3 Days)  Patient/caregiver understands and will follow disposition?: Yes  Copied from CRM [id number]. Topic: Clinical - Red Word Triage >> Mar 19, 2024 10:40 AM [place] A wrote: [facility] that prompted transfer to Nurse Triage: Patient is having pain in her right arm. Hurts for her lift her arm up. Reason for Disposition  [1] MODERATE pain (e.g., interferes with normal activities) AND [2] present > 3 days  Answer Assessment - Initial Assessment Questions 1. ONSET: When did the pain start?     Few days ago, around Sunday or Monday. 2. LOCATION: Where is the pain located?     Right arm 3. PAIN: How bad is the pain? (Scale 0-10; or none, mild, moderate, severe)     Moderate. But easing. Yesterday had difficulty raising arm, this has improved.  4. WORK OR EXERCISE: Has there been any recent work or exercise that involved this part of the body?     No-had an allergy shot in right arm, it's bruised. She spoke with allergist who said the injection would not cause this pain and to follow up with pcp.  5. CAUSE: What do you think is causing the arm pain?     Allergy shot 6. OTHER SYMPTOMS: Do you have any other symptoms? (e.g., neck pain, swelling, rash, fever, numbness, weakness)     Hot flashes  Protocols used: Arm Pain-A-AH

## 2024-03-20 ENCOUNTER — Encounter: Payer: Self-pay | Admitting: Family Medicine

## 2024-03-20 ENCOUNTER — Ambulatory Visit (INDEPENDENT_AMBULATORY_CARE_PROVIDER_SITE_OTHER): Admitting: Family Medicine

## 2024-03-20 VITALS — BP 130/70 | HR 77 | Temp 97.8°F | Ht 63.5 in | Wt 194.5 lb

## 2024-03-20 DIAGNOSIS — M79601 Pain in right arm: Secondary | ICD-10-CM

## 2024-03-20 DIAGNOSIS — M25511 Pain in right shoulder: Secondary | ICD-10-CM

## 2024-03-20 MED ORDER — PREDNISONE 20 MG PO TABS: ORAL_TABLET | ORAL | 0 refills | Status: DC

## 2024-03-20 NOTE — Assessment & Plan Note (Signed)
 Acute, no sign of cellulitis or rash, no clear suggestion of upper extremity DVT as noticed associated swelling. She did have an injection of allergy medicine but this area of soreness was not clearly associated.  There is also no clear sign of allergic reaction. She has significant decrease in range of motion of her shoulder as well and the pain consistent with shoulder bursitis.  Recommend prednisone  taper as well as home physical therapy.  Recommend continuing icing and avoiding lifting greater than 10 pounds.  If she is not improving as expected she will follow-up for reevaluation.

## 2024-03-20 NOTE — Progress Notes (Signed)
 Patient ID: [name], female    DOB: January 24, 1946, 78 y.o.   MRN: [id number]  This visit was conducted in person.  BP 130/70   Pulse 77   Temp 97.8 F (36.6 C) (Temporal)   Ht 5' 3.5 (1.613 m)   Wt 194 lb 8 oz (88.2 kg)   SpO2 96%   BMI 33.91 kg/m    CC:  Chief Complaint  Patient presents with   Arm Pain    Right Upper Arm    Subjective:   HPI: [name] is a 78 y.o. female presenting on 03/20/2024 for Arm Pain (Right Upper Arm)   Right  upper arm pain,  laterally  over upper arm in last week. No redness  Pain with lifting arm up  Cannot raise arm abover 45 depress.   No recent injury, or falls.    Hx of DVT in right leg and PE on anticoagulation.    Now starting Xifaxan with GI.       Relevant past medical, surgical, family and social history reviewed and updated as indicated. Interim medical history since our last visit reviewed. Allergies and medications reviewed and updated. Outpatient Medications Prior to Visit  Medication Sig Dispense Refill   ADVAIR  HFA 230-21 MCG/ACT inhaler Inhale 2 puffs into the lungs 2 (two) times daily. On empty lungs 36 g 1   albuterol  (PROVENTIL ) (2.5 MG/3ML) 0.083% nebulizer solution Take 3 mLs (2.5 mg total) by nebulization every 6 (six) hours as needed for wheezing or shortness of breath. 150 mL 1   albuterol  (VENTOLIN  HFA) 108 (90 Base) MCG/ACT inhaler Inhale 2 puffs into the lungs every 6 (six) hours as needed for wheezing or shortness of breath (Take with Flovent  During Flare Ups.). 18 g 1   anastrozole  (ARIMIDEX ) 1 MG tablet TAKE 1 TABLET(1 MG) BY MOUTH DAILY 90 tablet 3   atorvastatin  (LIPITOR) 10 MG tablet TAKE 1 TABLET(10 MG) BY MOUTH DAILY 90 tablet 3   Blood Glucose Monitoring Suppl (ONETOUCH VERIO) w/Device KIT Use to check blood sugar up to 2 times a day 1 kit 0   budesonide  (PULMICORT ) 0.5 MG/2ML nebulizer solution Take 2 mLs (0.5 mg total) by nebulization in the morning and at bedtime. 120 mL 0    cyclobenzaprine  (FLEXERIL ) 10 MG tablet Take 0.5-1 tablets (5-10 mg total) by mouth at bedtime as needed for muscle spasms. 20 tablet 0   desloratadine  (CLARINEX ) 5 MG tablet TAKE 1 TABLET(5 MG) BY MOUTH DAILY 90 tablet 1   Empagliflozin -linaGLIPtin  (GLYXAMBI ) 10-5 MG TABS TAKE 1 TABLET BY MOUTH DAILY 30 tablet 2   EPINEPHrine  0.3 mg/0.3 mL IJ SOAJ injection Inject 0.3 mg into the muscle as needed for anaphylaxis. USE AS DIRECTED FOR LIFE THREATENING ALLERGIC REACTIONS 2 each 1   estradiol  (ESTRACE ) 0.1 MG/GM vaginal cream Place 1 Applicatorful vaginally 2 (two) times a week. (As Needed)     famotidine  (PEPCID ) 40 MG tablet Take 1 tablet (40 mg total) by mouth at bedtime. Take 1 (ONE) tablet by mouth every evening. 90 tablet 1   fluticasone  (FLOVENT  HFA) 220 MCG/ACT inhaler Inhale 2 puffs into the lungs every 6 (six) hours as needed. Use with Albuterol  during flare ups. 36 g 1   gabapentin  (NEURONTIN ) 300 MG capsule TAKE 1 CAPSULE BY MOUTH IN THE MORNING, 1 CAPSULE AT LUNCH AND 2 CAPSULES AT BEDTIME 360 capsule 3   glucose blood (ONETOUCH VERIO) test strip 1 strip 2 (two) times daily  hydrOXYzine  (ATARAX ) 10 MG tablet Take 1 tablet (10 mg total) by mouth daily as needed for itching. 30 tablet 2   ipratropium (ATROVENT ) 0.06 % nasal spray Place 2 sprays into both nostrils 2 (two) times daily as needed for rhinitis. 45 mL 1   irbesartan  (AVAPRO ) 150 MG tablet TAKE 1 TABLET(150 MG) BY MOUTH DAILY 90 tablet 1   Lactase (DAIRY-RELIEF PO) Take 1 capsule by mouth daily as needed (eating dairy).     Lancets (ONETOUCH DELICA PLUS LANCET33G) MISC USE TO CHECK BLOOD SUGAR UP TO TWICE DAILY 100 each 5   Magnesium 500 MG TABS Take 500 mg by mouth 2 (two) times daily.     Menthol , Topical Analgesic, (ABSORBINE PLUS JR EX) Apply 1 patch topically daily as needed (pain).     Menthol -Methyl Salicylate (SALONPAS JET SPRAY EX) Apply 1 spray topically daily as needed (knee pain).     methimazole  (TAPAZOLE ) 5 MG  tablet Take 1 tablet (5 mg total) by mouth daily. 90 tablet 1   montelukast  (SINGULAIR ) 10 MG tablet Take 1 tablet (10 mg total) by mouth at bedtime. For asthma control. 90 tablet 1   Mouthwashes (BIOTENE DRY MOUTH MT) Use as directed 1 Dose in the mouth or throat daily as needed (dry mouth).     Nebulizer MISC 1 Device by Does not apply route as directed. 1 each 1   ondansetron  (ZOFRAN -ODT) 4 MG disintegrating tablet Take 1 tablet (4 mg total) by mouth every 8 (eight) hours as needed for nausea or vomiting. 20 tablet 0   pantoprazole  (PROTONIX ) 40 MG tablet Take 1 tablet (40 mg total) by mouth 2 (two) times daily. 180 tablet 1   Plecanatide (TRULANCE) 3 MG TABS Take 3 mg by mouth daily as needed (constipation).     Polyethyl Glycol-Propyl Glycol (SYSTANE OP) Place 1 drop into both eyes daily as needed (dry eyes).     polyethylene glycol powder (GLYCOLAX /MIRALAX ) 17 GM/SCOOP powder Take 1 Container by mouth once.     povidone-iodine  (BETADINE ) 10 % external solution Apply 1 Application topically as needed for wound care. 480 mL 0   PRESCRIPTION MEDICATION Inhale into the lungs at bedtime. CPAP     Respiratory Therapy Supplies (NEBULIZER MASK ADULT) MISC 1 kit by Does not apply route as directed. 1 each 1   rivaroxaban  (XARELTO ) 20 MG TABS tablet Take 1 tablet (20 mg total) by mouth daily with supper. 90 tablet 1   triamcinolone  (NASACORT ) 55 MCG/ACT AERO nasal inhaler Place 2 sprays into the nose daily. 50.7 mL 1   Vibegron  (GEMTESA ) 75 MG TABS 1 tablet daily 30 tablet 0   XIFAXAN 550 MG TABS tablet Take 550 mg by mouth 3 (three) times daily.     Facility-Administered Medications Prior to Visit  Medication Dose Route Frequency Provider Last Rate Last Admin   tezepelumab -ekko (TEZSPIRE ) 210 MG/1. syringe 210 mg  210 mg Subcutaneous Q28 days Kozlow, Eric J, MD   210 mg at 03/10/24 0940     Per HPI unless specifically indicated in ROS section below Review of Systems  Constitutional:   Negative for fatigue and fever.  HENT:  Negative for congestion.   Eyes:  Negative for pain.  Respiratory:  Negative for cough and shortness of breath.   Cardiovascular:  Negative for chest pain, palpitations and leg swelling.  Gastrointestinal:  Negative for abdominal pain.  Genitourinary:  Negative for dysuria and vaginal bleeding.  Musculoskeletal:  Negative for back pain and neck pain.  Neurological:  Negative for syncope, light-headedness and headaches.  Psychiatric/Behavioral:  Negative for dysphoric mood.    Objective:  BP 130/70   Pulse 77   Temp 97.8 F (36.6 C) (Temporal)   Ht 5' 3.5 (1.613 m)   Wt 194 lb 8 oz (88.2 kg)   SpO2 96%   BMI 33.91 kg/m   Wt Readings from Last 3 Encounters:  03/20/24 194 lb 8 oz (88.2 kg)  03/17/24 195 lb 14.4 oz (88.9 kg)  03/06/24 201 lb (91.2 kg)      Physical Exam Constitutional:      General: She is not in acute distress.    Appearance: Normal appearance. She is well-developed. She is not ill-appearing or toxic-appearing.  HENT:     Head: Normocephalic.     Right Ear: Hearing, tympanic membrane, ear canal and external ear normal. Tympanic membrane is not erythematous, retracted or bulging.     Left Ear: Hearing, tympanic membrane, ear canal and external ear normal. Tympanic membrane is not erythematous, retracted or bulging.     Nose: No mucosal edema or rhinorrhea.     Right Sinus: No maxillary sinus tenderness or frontal sinus tenderness.     Left Sinus: No maxillary sinus tenderness or frontal sinus tenderness.     Mouth/Throat:     Pharynx: Uvula midline.  Eyes:     General: Lids are normal. Lids are everted, no foreign bodies appreciated.     Conjunctiva/sclera: Conjunctivae normal.     Pupils: Pupils are equal, round, and reactive to light.  Neck:     Thyroid : No thyroid  mass or thyromegaly.     Vascular: No carotid bruit.     Trachea: Trachea normal.  Cardiovascular:     Rate and Rhythm: Normal rate and regular  rhythm.     Pulses: Normal pulses.     Heart sounds: Normal heart sounds, S1 normal and S2 normal. No murmur heard.    No friction rub. No gallop.  Pulmonary:     Effort: Pulmonary effort is normal. No tachypnea or respiratory distress.     Breath sounds: Normal breath sounds. No decreased breath sounds, wheezing, rhonchi or rales.  Abdominal:     General: Bowel sounds are normal.     Palpations: Abdomen is soft.     Tenderness: There is no abdominal tenderness.  Musculoskeletal:     Right shoulder: Tenderness and bony tenderness present. No swelling.     Right upper arm: Tenderness and bony tenderness present. No swelling, edema or deformity.     Cervical back: Normal range of motion and neck supple.  Skin:    General: Skin is warm and dry.     Findings: No rash.  Neurological:     Mental Status: She is alert.  Psychiatric:        Mood and Affect: Mood is not anxious or depressed.        Speech: Speech normal.        Behavior: Behavior normal. Behavior is cooperative.        Thought Content: Thought content normal.        Judgment: Judgment normal.       Results for orders placed or performed in visit on 03/06/24  POC COVID-19   Collection Time: 03/06/24 11:26 AM  Result Value Ref Range   SARS Coronavirus 2 Ag Negative Negative   *Note: Due to a large number of results and/or encounters for the requested time period, some results have not been displayed.  A complete set of results can be found in Results Review.    Assessment and Plan  Right arm pain Assessment & Plan: Acute, no sign of cellulitis or rash, no clear suggestion of upper extremity DVT as noticed associated swelling. She did have an injection of allergy medicine but this area of soreness was not clearly associated.  There is also no clear sign of allergic reaction. She has significant decrease in range of motion of her shoulder as well and the pain consistent with shoulder bursitis.  Recommend prednisone   taper as well as home physical therapy.  Recommend continuing icing and avoiding lifting greater than 10 pounds.  If she is not improving as expected she will follow-up for reevaluation.   Acute pain of right shoulder  Other orders -     predniSONE ; 3 tabs by mouth daily x 3 days, then 2 tabs by mouth daily x 2 days then 1 tab by mouth daily x 2 days  Dispense: 15 tablet; Refill: 0    No follow-ups on file.   Greig Ring, MD

## 2024-03-23 DIAGNOSIS — H04123 Dry eye syndrome of bilateral lacrimal glands: Secondary | ICD-10-CM | POA: Diagnosis not present

## 2024-03-23 DIAGNOSIS — H40013 Open angle with borderline findings, low risk, bilateral: Secondary | ICD-10-CM | POA: Diagnosis not present

## 2024-03-24 ENCOUNTER — Telehealth: Payer: Self-pay | Admitting: Adult Health

## 2024-03-24 NOTE — Telephone Encounter (Signed)
 Pt called   to request to Speak to NP . Pt didn't go into detail however the patient is requesting to speak to [facility] about appt  in December

## 2024-03-25 ENCOUNTER — Encounter: Payer: Self-pay | Admitting: Cardiovascular Disease

## 2024-03-25 NOTE — Telephone Encounter (Signed)
 LVM for patient to call back and discuss  Megan,NP recommendation.

## 2024-03-25 NOTE — Telephone Encounter (Signed)
 Spoke to patient . Pt decided to keep appointment 06/2024  toDiscuss further about sleep apnea

## 2024-03-25 NOTE — Telephone Encounter (Signed)
 If the patient feels that she was sleeping better with the CPAP and wants to discuss this. Then yes keep the appointment.

## 2024-03-25 NOTE — Telephone Encounter (Signed)
 Spoke to patient wants to know since she doesn't have to use her cpap anymore does she needs to keep her f/u visit in Dec 2025 . Will forward to Megan,NP for her recommendation.

## 2024-03-31 ENCOUNTER — Ambulatory Visit (INDEPENDENT_AMBULATORY_CARE_PROVIDER_SITE_OTHER)

## 2024-03-31 DIAGNOSIS — E1142 Type 2 diabetes mellitus with diabetic polyneuropathy: Secondary | ICD-10-CM | POA: Diagnosis not present

## 2024-03-31 DIAGNOSIS — T148XXA Other injury of unspecified body region, initial encounter: Secondary | ICD-10-CM | POA: Diagnosis not present

## 2024-03-31 DIAGNOSIS — M79671 Pain in right foot: Secondary | ICD-10-CM | POA: Diagnosis not present

## 2024-03-31 MED ORDER — CLINDAMYCIN HCL 300 MG PO CAPS: 300.0000 mg | ORAL_CAPSULE | Freq: Three times a day (TID) | ORAL | 0 refills | Status: DC

## 2024-03-31 NOTE — Progress Notes (Signed)
 Subjective:  Patient ID: [name], female    DOB: 1945/08/29,  MRN: [id number]  Chief Complaint  Patient presents with   Nail Problem    black on toe-might be infected. Right foot 3rd toes  large blister    78 y.o. female presents with the above complaint.  Patient states that on the third toe, she has had a blister that has developed and increased in size since Sunday.  She denies any injury she can recall to the area.  She denies any signs of infection such as edema, purulence.  She is a diabetic that is well-controlled.   RTC 1 week. Lanced. Clindamycin  300mg  TID prescription sent. Abx ointment, telfa,   Review of Systems: Negative except as noted in the HPI. Denies N/V/F/Ch.  Past Medical History:  Diagnosis Date   Allergic rhinitis    Allergy    Arthritis    Asthma    Breast cancer (HCC)    Chronic headache    Diabetes mellitus    Ductal carcinoma in situ (DCIS) of left breast 02/23/2022   DVT (deep venous thrombosis) (HCC)    Dyspnea    Heart murmur    History of radiation therapy    Left Breast 05/02/22-05/30/22- Dr. Lynwood Nasuti   Hypertension    Penicillin allergy 01/19/2020   Pulmonary embolism (HCC)    Pulmonary hypertension (HCC)    Seizures (HCC)     Current Outpatient Medications:    clindamycin  (CLEOCIN ) 300 MG capsule, Take 1 capsule (300 mg total) by mouth 3 (three) times daily., Disp: 21 capsule, Rfl: 0   ADVAIR  HFA 230-21 MCG/ACT inhaler, Inhale 2 puffs into the lungs 2 (two) times daily. On empty lungs, Disp: 36 g, Rfl: 1   albuterol  (PROVENTIL ) (2.5 MG/3ML) 0.083% nebulizer solution, Take 3 mLs (2.5 mg total) by nebulization every 6 (six) hours as needed for wheezing or shortness of breath., Disp: 150 mL, Rfl: 1   albuterol  (VENTOLIN  HFA) 108 (90 Base) MCG/ACT inhaler, Inhale 2 puffs into the lungs every 6 (six) hours as needed for wheezing or shortness of breath (Take with Flovent  During Flare Ups.)., Disp: 18 g, Rfl: 1   anastrozole   (ARIMIDEX ) 1 MG tablet, TAKE 1 TABLET(1 MG) BY MOUTH DAILY, Disp: 90 tablet, Rfl: 3   atorvastatin  (LIPITOR) 10 MG tablet, TAKE 1 TABLET(10 MG) BY MOUTH DAILY, Disp: 90 tablet, Rfl: 3   Blood Glucose Monitoring Suppl (ONETOUCH VERIO) w/Device KIT, Use to check blood sugar up to 2 times a day, Disp: 1 kit, Rfl: 0   budesonide  (PULMICORT ) 0.5 MG/2ML nebulizer solution, Take 2 mLs (0.5 mg total) by nebulization in the morning and at bedtime., Disp: 120 mL, Rfl: 0   cyclobenzaprine  (FLEXERIL ) 10 MG tablet, Take 0.5-1 tablets (5-10 mg total) by mouth at bedtime as needed for muscle spasms., Disp: 20 tablet, Rfl: 0   desloratadine  (CLARINEX ) 5 MG tablet, TAKE 1 TABLET(5 MG) BY MOUTH DAILY, Disp: 90 tablet, Rfl: 1   Empagliflozin -linaGLIPtin  (GLYXAMBI ) 10-5 MG TABS, TAKE 1 TABLET BY MOUTH DAILY, Disp: 30 tablet, Rfl: 2   EPINEPHrine  0.3 mg/0.3 mL IJ SOAJ injection, Inject 0.3 mg into the muscle as needed for anaphylaxis. USE AS DIRECTED FOR LIFE THREATENING ALLERGIC REACTIONS, Disp: 2 each, Rfl: 1   estradiol  (ESTRACE ) 0.1 MG/GM vaginal cream, Place 1 Applicatorful vaginally 2 (two) times a week. (As Needed), Disp: , Rfl:    famotidine  (PEPCID ) 40 MG tablet, Take 1 tablet (40 mg total) by mouth at bedtime. Take  1 (ONE) tablet by mouth every evening., Disp: 90 tablet, Rfl: 1   fluticasone  (FLOVENT  HFA) 220 MCG/ACT inhaler, Inhale 2 puffs into the lungs every 6 (six) hours as needed. Use with Albuterol  during flare ups., Disp: 36 g, Rfl: 1   gabapentin  (NEURONTIN ) 300 MG capsule, TAKE 1 CAPSULE BY MOUTH IN THE MORNING, 1 CAPSULE AT LUNCH AND 2 CAPSULES AT BEDTIME, Disp: 360 capsule, Rfl: 3   glucose blood (ONETOUCH VERIO) test strip, 1 strip 2 (two) times daily, Disp: , Rfl:    hydrOXYzine  (ATARAX ) 10 MG tablet, Take 1 tablet (10 mg total) by mouth daily as needed for itching., Disp: 30 tablet, Rfl: 2   ipratropium (ATROVENT ) 0.06 % nasal spray, Place 2 sprays into both nostrils 2 (two) times daily as needed  for rhinitis., Disp: 45 mL, Rfl: 1   irbesartan  (AVAPRO ) 150 MG tablet, TAKE 1 TABLET(150 MG) BY MOUTH DAILY, Disp: 90 tablet, Rfl: 1   Lactase (DAIRY-RELIEF PO), Take 1 capsule by mouth daily as needed (eating dairy)., Disp: , Rfl:    Lancets (ONETOUCH DELICA PLUS LANCET33G) MISC, USE TO CHECK BLOOD SUGAR UP TO TWICE DAILY, Disp: 100 each, Rfl: 5   Magnesium 500 MG TABS, Take 500 mg by mouth 2 (two) times daily., Disp: , Rfl:    Menthol , Topical Analgesic, (ABSORBINE PLUS JR EX), Apply 1 patch topically daily as needed (pain)., Disp: , Rfl:    Menthol -Methyl Salicylate (SALONPAS JET SPRAY EX), Apply 1 spray topically daily as needed (knee pain)., Disp: , Rfl:    methimazole  (TAPAZOLE ) 5 MG tablet, Take 1 tablet (5 mg total) by mouth daily., Disp: 90 tablet, Rfl: 1   montelukast  (SINGULAIR ) 10 MG tablet, Take 1 tablet (10 mg total) by mouth at bedtime. For asthma control., Disp: 90 tablet, Rfl: 1   Mouthwashes (BIOTENE DRY MOUTH MT), Use as directed 1 Dose in the mouth or throat daily as needed (dry mouth)., Disp: , Rfl:    Nebulizer MISC, 1 Device by Does not apply route as directed., Disp: 1 each, Rfl: 1   ondansetron  (ZOFRAN -ODT) 4 MG disintegrating tablet, Take 1 tablet (4 mg total) by mouth every 8 (eight) hours as needed for nausea or vomiting., Disp: 20 tablet, Rfl: 0   pantoprazole  (PROTONIX ) 40 MG tablet, Take 1 tablet (40 mg total) by mouth 2 (two) times daily., Disp: 180 tablet, Rfl: 1   Plecanatide (TRULANCE) 3 MG TABS, Take 3 mg by mouth daily as needed (constipation)., Disp: , Rfl:    Polyethyl Glycol-Propyl Glycol (SYSTANE OP), Place 1 drop into both eyes daily as needed (dry eyes)., Disp: , Rfl:    polyethylene glycol powder (GLYCOLAX /MIRALAX ) 17 GM/SCOOP powder, Take 1 Container by mouth once., Disp: , Rfl:    povidone-iodine  (BETADINE ) 10 % external solution, Apply 1 Application topically as needed for wound care., Disp: 480 mL, Rfl: 0   predniSONE  (DELTASONE ) 20 MG tablet, 3 tabs  by mouth daily x 3 days, then 2 tabs by mouth daily x 2 days then 1 tab by mouth daily x 2 days, Disp: 15 tablet, Rfl: 0   PRESCRIPTION MEDICATION, Inhale into the lungs at bedtime. CPAP, Disp: , Rfl:    Respiratory Therapy Supplies (NEBULIZER MASK ADULT) MISC, 1 kit by Does not apply route as directed., Disp: 1 each, Rfl: 1   rivaroxaban  (XARELTO ) 20 MG TABS tablet, Take 1 tablet (20 mg total) by mouth daily with supper., Disp: 90 tablet, Rfl: 1   triamcinolone  (NASACORT ) 55 MCG/ACT AERO  nasal inhaler, Place 2 sprays into the nose daily., Disp: 50.7 mL, Rfl: 1   Vibegron  (GEMTESA ) 75 MG TABS, 1 tablet daily, Disp: 30 tablet, Rfl: 0   XIFAXAN 550 MG TABS tablet, Take 550 mg by mouth 3 (three) times daily., Disp: , Rfl:   Current Facility-Administered Medications:    tezepelumab -ekko (TEZSPIRE ) 210 MG/1. syringe 210 mg, 210 mg, Subcutaneous, Q28 days, Kozlow, Camellia PARAS, MD, 210 mg at 03/10/24 0940  Social History   Tobacco Use  Smoking Status Never   Passive exposure: Never  Smokeless Tobacco Never    Allergies  Allergen Reactions   Dilantin [Phenytoin] Swelling    facial swelling   Latex Hives   Oxycodone  Nausea And Vomiting and Nausea Only    Abdominal Pain, Vomiting   Penicillins Nausea And Vomiting and Swelling    Has patient had a PCN reaction causing immediate rash, facial/tongue/throat swelling, SOB or lightheadedness with hypotension patient had a PCN reaction causing severe rash involving mucus membranes or skin necrosis: [id number]} Has patient had a PCN reaction that required hospitalization/No Has patient had a PCN reaction occurring within the last 10 years: No If all of the above answers are NO, then may proceed with Cephalosporin use.      Codeine  Nausea Only    Tolerates tylenol  with codeine    Hydrocodone  Nausea And Vomiting   Nickel Hives and Rash   Pregabalin Itching and Other (See Comments)    headaches/problem w/vision   Objective:  There were no  vitals filed for this visit. There is no height or weight on file to calculate BMI. Constitutional Well developed. Well nourished. Oriented to person, place, and time.  Vascular Dorsalis pedis pulses palpable bilaterally. Posterior tibial pulses palpable bilaterally. Capillary refill normal to all digits.  No cyanosis or clubbing noted. Pedal hair growth normal.  Neurologic Normal speech. Epicritic sensation to light touch grossly present bilaterally. Negative tinel sign at tarsal tunnel bilaterally.   Dermatologic Skin texture and turgor are within normal limits.  Blister on dorsal aspect of right third toe with blood.  Extends from the nailbed to the metatarsophalangeal joint.  No edema, focal erythema, purulence or other signs of infection.  Ecchymosis to dorsal forefoot. Toenail is stable.  No other open lesions.   Musculoskeletal: 5/5 muscle strength, rectus foot shape. Tender to palpation of dorsal forefoot.      Radiographs: Taken and reviewed.  3 views of the right foot were taken today.  These do not demonstrate any acute osseous pathology.  No cortical erosions, periosteal reaction, or other signs of osteomyelitis identified at the right third digit.  There is medial deviation of the third digit at the distal interphalangeal joint.  Assessment:   1. Foot pain, right   2. Blood blister    Plan:  - Patient was evaluated and treated and all questions answered.  Blood blister dorsal right third toe -Discussed diagnosis of blood blister with patient.  She expresses understanding of this.  There are no signs of infection that are currently present, we did discuss these at length.  She will contact us  if any signs of infection become present. -Ordered clindamycin  300 mg 3 times daily x 7 days to the patient's pharmacy to protect against soft tissue infection. -Due to continued growing nature of blister, it was lanced to prevent continued growth.  This was done in sterile fashion.   A dressing of antibiotic ointment, Telfa, 4 x 4 gauze, Coban was applied. She will apply a dressing  with abx ointment and gauze/bandaid once daily. She will allow lanced blister skin to fall off naturally.  - Return in 1 week for close follow-up   Return in about 1 week (around 04/07/2024).  Prentice Ovens, DPM AACFAS Fellowship Trained Podiatric Surgeon [facility]

## 2024-04-01 ENCOUNTER — Other Ambulatory Visit: Payer: Self-pay | Admitting: Family Medicine

## 2024-04-01 ENCOUNTER — Telehealth: Payer: Self-pay

## 2024-04-01 DIAGNOSIS — E1140 Type 2 diabetes mellitus with diabetic neuropathy, unspecified: Secondary | ICD-10-CM

## 2024-04-01 MED ORDER — GLYXAMBI 10-5 MG PO TABS: 1.0000 | ORAL_TABLET | Freq: Every day | ORAL | 1 refills | Status: DC

## 2024-04-01 NOTE — Telephone Encounter (Signed)
 Patient saw Dr A Regal yesterday in [facility]. Patient calling today asking if she can take a shower or bath because of her foot?

## 2024-04-03 ENCOUNTER — Telehealth: Payer: Self-pay | Admitting: Allergy and Immunology

## 2024-04-03 NOTE — Telephone Encounter (Signed)
 Patient states she is having some issues with her insurance at the moment. She no longer has [employer], she has [employer]. The pharmacy informed her that they have to file her [employer] as primary and that ends up costing her $800 for her Advair . She wants to know if we are able to clarify what may be going wrong at the pharmacy or if there is an alternative that can be sent in. If we have samples of the alternative inhaler she is able to pick up today since she is running low.

## 2024-04-06 ENCOUNTER — Telehealth: Payer: Self-pay | Admitting: Cardiovascular Disease

## 2024-04-06 ENCOUNTER — Other Ambulatory Visit (HOSPITAL_COMMUNITY): Payer: Self-pay

## 2024-04-06 ENCOUNTER — Telehealth: Payer: Self-pay

## 2024-04-06 NOTE — Telephone Encounter (Signed)
*  AA  Pharmacy Patient Advocate Encounter   Received notification from Pt Calls Messages that prior authorization for Advair  HFA is required/requested.   Insurance verification completed.   The patient is insured through [employer] .   Per test claim: The current 90 day co-pay is, $129.00.  No PA needed at this time. This test claim was processed through [facility]- copay amounts may vary at other pharmacies due to pharmacy/plan contracts, or as the patient moves through the different stages of their insurance plan.

## 2024-04-06 NOTE — Telephone Encounter (Signed)
 Pt c/o medication issue:  1. Name of Medication: rivaroxaban  (XARELTO ) 20 MG TABS tablet   2. How are you currently taking this medication (dosage and times per day)? Take 1 tablet (20 mg total) by mouth daily with supper.   3. Are you having a reaction (difficulty breathing--STAT)? No  4. What is your medication issue? Pt recently had an insurance switch that left her without Medicare Part D. Her prescription is now around $700. Pt wanting to know if we can offer her any samples of Xarelto  until she can enroll in Part D on 04/30/24. Please advise.

## 2024-04-06 NOTE — Telephone Encounter (Signed)
 Routed to clinical pharmacy team for assistance with OAC/samples

## 2024-04-07 ENCOUNTER — Telehealth: Payer: Self-pay

## 2024-04-07 ENCOUNTER — Ambulatory Visit

## 2024-04-07 ENCOUNTER — Ambulatory Visit: Admitting: Speech Pathology

## 2024-04-07 ENCOUNTER — Other Ambulatory Visit (HOSPITAL_COMMUNITY): Payer: Self-pay

## 2024-04-07 ENCOUNTER — Telehealth: Payer: Self-pay | Admitting: *Deleted

## 2024-04-07 ENCOUNTER — Telehealth: Payer: Self-pay | Admitting: Pharmacy Technician

## 2024-04-07 MED ORDER — APIXABAN 5 MG PO TABS: 5.0000 mg | ORAL_TABLET | Freq: Two times a day (BID) | ORAL | 0 refills | Status: DC

## 2024-04-07 MED ORDER — ANASTROZOLE 1 MG PO TABS
1.0000 mg | ORAL_TABLET | Freq: Every day | ORAL | 3 refills | Status: AC
  Filled 2024-04-07 – 2024-04-09 (×3): qty 30, 30d supply, fill #0

## 2024-04-07 NOTE — Telephone Encounter (Signed)
 Patient called back stating she has already called [employer] and let them know that.

## 2024-04-07 NOTE — Telephone Encounter (Signed)
 We do not have Xarelto  20mg  samples. But can give Eliquis . Pt advised Eliquis  is twice a day. She was appreciative of the help. Samples placed up front. She will pick up Thursday.

## 2024-04-07 NOTE — Telephone Encounter (Signed)
*  [facility]  Pharmacy Patient Advocate Encounter   Received notification from Fax that prior authorization for Albuterol  Neb 0.083% is required/requested.   Insurance verification completed.   The patient is insured through [employer] .   Per test claim: The current 13 day co-pay is, $5.37.  No PA needed at this time. This test claim was processed through [facility]- copay amounts may vary at other pharmacies due to pharmacy/plan contracts, or as the patient moves through the different stages of their insurance plan.     *also may be able to be ran through Medicare Part B

## 2024-04-07 NOTE — Telephone Encounter (Signed)
 Pharmacy states that patient needs to call [employer] and inform them that she no longer has primary insurance and have them remove her inactive primary insurance. If she does have primary insurance she should contact her primary insurance to see why there is this problem.  I called and left voicemail for patient to call the office back for us  to give this information to her.

## 2024-04-07 NOTE — Telephone Encounter (Signed)
 Received call from pt stating she no longer has [employer] insurance coverage and has [employer] for life.  Pt states she is having trouble filling her medications at [facility] due to the insurance change.  RN contacted [facility] out pt pharmacy who states Anastrozole  will cost $10 for a 30 day supply at their location. Pt notified and requested prescription be sent to [facility] while she figures out her prescription coverage. Prescription sent to pharmacy per pt request.

## 2024-04-07 NOTE — Telephone Encounter (Signed)
 Spoke with patient she stated that the pharmacy was aware of tricare being her primary insurance.

## 2024-04-09 ENCOUNTER — Telehealth: Payer: Self-pay

## 2024-04-09 ENCOUNTER — Ambulatory Visit (INDEPENDENT_AMBULATORY_CARE_PROVIDER_SITE_OTHER)

## 2024-04-09 ENCOUNTER — Other Ambulatory Visit (HOSPITAL_COMMUNITY): Payer: Self-pay

## 2024-04-09 DIAGNOSIS — M79671 Pain in right foot: Secondary | ICD-10-CM | POA: Diagnosis not present

## 2024-04-09 DIAGNOSIS — L03115 Cellulitis of right lower limb: Secondary | ICD-10-CM

## 2024-04-09 MED ORDER — DOXYCYCLINE HYCLATE 100 MG PO TABS: 100.0000 mg | ORAL_TABLET | Freq: Two times a day (BID) | ORAL | 0 refills | Status: DC

## 2024-04-09 NOTE — Progress Notes (Signed)
 Subjective:  Patient ID: [name], female    DOB: 08-Mar-1946,  MRN: [id number]  Chief Complaint  Patient presents with   Routine Post Op    follow up right foot. Pain started on Tuesday. She reports swelling and feeling like the blister has grown.    78 y.o. female presents with the above complaint.  A week ago, she was in clinic and was diagnosed with a friction blister to her right third toe.  At that time, the blister was lanced and clindamycin  was prescribed.  She states that the area was doing very well without any pain or swelling until the end of the antibiotics on Tuesday.  She now reports some recurrent swelling and minor pain.  She is a diabetic that is well-controlled.    Review of Systems: Negative except as noted in the HPI. Denies N/V/F/Ch.  Past Medical History:  Diagnosis Date   Allergic rhinitis    Allergy    Arthritis    Asthma    Breast cancer (HCC)    Chronic headache    Diabetes mellitus    Ductal carcinoma in situ (DCIS) of left breast 02/23/2022   DVT (deep venous thrombosis) (HCC)    Dyspnea    Heart murmur    History of radiation therapy    Left Breast 05/02/22-05/30/22- Dr. Lynwood Nasuti   Hypertension    Penicillin allergy 01/19/2020   Pulmonary embolism (HCC)    Pulmonary hypertension (HCC)    Seizures (HCC)     Current Outpatient Medications:    ADVAIR  HFA 230-21 MCG/ACT inhaler, Inhale 2 puffs into the lungs 2 (two) times daily. On empty lungs, Disp: 36 g, Rfl: 1   albuterol  (PROVENTIL ) (2.5 MG/3ML) 0.083% nebulizer solution, Take 3 mLs (2.5 mg total) by nebulization every 6 (six) hours as needed for wheezing or shortness of breath., Disp: 150 mL, Rfl: 1   albuterol  (VENTOLIN  HFA) 108 (90 Base) MCG/ACT inhaler, Inhale 2 puffs into the lungs every 6 (six) hours as needed for wheezing or shortness of breath (Take with Flovent  During Flare Ups.)., Disp: 18 g, Rfl: 1   anastrozole  (ARIMIDEX ) 1 MG tablet, Take 1 tablet (1 mg total) by mouth  daily., Disp: 30 tablet, Rfl: 3   apixaban  (ELIQUIS ) 5 MG TABS tablet, Take 1 tablet (5 mg total) by mouth 2 (two) times daily., Disp: 56 tablet, Rfl: 0   atorvastatin  (LIPITOR) 10 MG tablet, TAKE 1 TABLET(10 MG) BY MOUTH DAILY, Disp: 90 tablet, Rfl: 3   Blood Glucose Monitoring Suppl (ONETOUCH VERIO) w/Device KIT, Use to check blood sugar up to 2 times a day, Disp: 1 kit, Rfl: 0   budesonide  (PULMICORT ) 0.5 MG/2ML nebulizer solution, Take 2 mLs (0.5 mg total) by nebulization in the morning and at bedtime., Disp: 120 mL, Rfl: 0   clindamycin  (CLEOCIN ) 300 MG capsule, Take 1 capsule (300 mg total) by mouth 3 (three) times daily., Disp: 21 capsule, Rfl: 0   cyclobenzaprine  (FLEXERIL ) 10 MG tablet, Take 0.5-1 tablets (5-10 mg total) by mouth at bedtime as needed for muscle spasms., Disp: 20 tablet, Rfl: 0   desloratadine  (CLARINEX ) 5 MG tablet, TAKE 1 TABLET(5 MG) BY MOUTH DAILY, Disp: 90 tablet, Rfl: 1   Empagliflozin -linaGLIPtin  (GLYXAMBI ) 10-5 MG TABS, Take 1 tablet by mouth daily., Disp: 90 tablet, Rfl: 1   EPINEPHrine  0.3 mg/0.3 mL IJ SOAJ injection, Inject 0.3 mg into the muscle as needed for anaphylaxis. USE AS DIRECTED FOR LIFE THREATENING ALLERGIC REACTIONS, Disp: 2 each, Rfl:  1   estradiol  (ESTRACE ) 0.1 MG/GM vaginal cream, Place 1 Applicatorful vaginally 2 (two) times a week. (As Needed), Disp: , Rfl:    famotidine  (PEPCID ) 40 MG tablet, Take 1 tablet (40 mg total) by mouth at bedtime. Take 1 (ONE) tablet by mouth every evening., Disp: 90 tablet, Rfl: 1   fluticasone  (FLOVENT  HFA) 220 MCG/ACT inhaler, Inhale 2 puffs into the lungs every 6 (six) hours as needed. Use with Albuterol  during flare ups., Disp: 36 g, Rfl: 1   gabapentin  (NEURONTIN ) 300 MG capsule, TAKE 1 CAPSULE BY MOUTH IN THE MORNING, 1 CAPSULE AT LUNCH AND 2 CAPSULES AT BEDTIME, Disp: 360 capsule, Rfl: 3   glucose blood (ONETOUCH VERIO) test strip, 1 strip 2 (two) times daily, Disp: , Rfl:    hydrOXYzine  (ATARAX ) 10 MG tablet,  Take 1 tablet (10 mg total) by mouth daily as needed for itching., Disp: 30 tablet, Rfl: 2   ipratropium (ATROVENT ) 0.06 % nasal spray, Place 2 sprays into both nostrils 2 (two) times daily as needed for rhinitis., Disp: 45 mL, Rfl: 1   irbesartan  (AVAPRO ) 150 MG tablet, TAKE 1 TABLET(150 MG) BY MOUTH DAILY, Disp: 90 tablet, Rfl: 1   Lactase (DAIRY-RELIEF PO), Take 1 capsule by mouth daily as needed (eating dairy)., Disp: , Rfl:    Lancets (ONETOUCH DELICA PLUS LANCET33G) MISC, USE TO CHECK BLOOD SUGAR UP TO TWICE DAILY, Disp: 100 each, Rfl: 5   Magnesium 500 MG TABS, Take 500 mg by mouth 2 (two) times daily., Disp: , Rfl:    Menthol , Topical Analgesic, (ABSORBINE PLUS JR EX), Apply 1 patch topically daily as needed (pain)., Disp: , Rfl:    Menthol -Methyl Salicylate (SALONPAS JET SPRAY EX), Apply 1 spray topically daily as needed (knee pain)., Disp: , Rfl:    methimazole  (TAPAZOLE ) 5 MG tablet, Take 1 tablet (5 mg total) by mouth daily., Disp: 90 tablet, Rfl: 1   montelukast  (SINGULAIR ) 10 MG tablet, Take 1 tablet (10 mg total) by mouth at bedtime. For asthma control., Disp: 90 tablet, Rfl: 1   Mouthwashes (BIOTENE DRY MOUTH MT), Use as directed 1 Dose in the mouth or throat daily as needed (dry mouth)., Disp: , Rfl:    Nebulizer MISC, 1 Device by Does not apply route as directed., Disp: 1 each, Rfl: 1   ondansetron  (ZOFRAN -ODT) 4 MG disintegrating tablet, Take 1 tablet (4 mg total) by mouth every 8 (eight) hours as needed for nausea or vomiting., Disp: 20 tablet, Rfl: 0   pantoprazole  (PROTONIX ) 40 MG tablet, Take 1 tablet (40 mg total) by mouth 2 (two) times daily., Disp: 180 tablet, Rfl: 1   Plecanatide (TRULANCE) 3 MG TABS, Take 3 mg by mouth daily as needed (constipation)., Disp: , Rfl:    Polyethyl Glycol-Propyl Glycol (SYSTANE OP), Place 1 drop into both eyes daily as needed (dry eyes)., Disp: , Rfl:    polyethylene glycol powder (GLYCOLAX /MIRALAX ) 17 GM/SCOOP powder, Take 1 Container by mouth  once., Disp: , Rfl:    povidone-iodine  (BETADINE ) 10 % external solution, Apply 1 Application topically as needed for wound care., Disp: 480 mL, Rfl: 0   predniSONE  (DELTASONE ) 20 MG tablet, 3 tabs by mouth daily x 3 days, then 2 tabs by mouth daily x 2 days then 1 tab by mouth daily x 2 days, Disp: 15 tablet, Rfl: 0   PRESCRIPTION MEDICATION, Inhale into the lungs at bedtime. CPAP, Disp: , Rfl:    Respiratory Therapy Supplies (NEBULIZER MASK ADULT) MISC, 1 kit by  Does not apply route as directed., Disp: 1 each, Rfl: 1   rivaroxaban  (XARELTO ) 20 MG TABS tablet, Take 1 tablet (20 mg total) by mouth daily with supper., Disp: 90 tablet, Rfl: 1   triamcinolone  (NASACORT ) 55 MCG/ACT AERO nasal inhaler, Place 2 sprays into the nose daily., Disp: 50.7 mL, Rfl: 1   Vibegron  (GEMTESA ) 75 MG TABS, 1 tablet daily, Disp: 30 tablet, Rfl: 0   XIFAXAN 550 MG TABS tablet, Take 550 mg by mouth 3 (three) times daily., Disp: , Rfl:   Current Facility-Administered Medications:    tezepelumab -ekko (TEZSPIRE ) 210 MG/1. syringe 210 mg, 210 mg, Subcutaneous, Q28 days, Kozlow, Camellia PARAS, MD, 210 mg at 03/10/24 0940  Social History   Tobacco Use  Smoking Status Never   Passive exposure: Never  Smokeless Tobacco Never    Allergies  Allergen Reactions   Dilantin [Phenytoin] Swelling    facial swelling   Latex Hives   Oxycodone  Nausea And Vomiting and Nausea Only    Abdominal Pain, Vomiting   Penicillins Nausea And Vomiting and Swelling    Has patient had a PCN reaction causing immediate rash, facial/tongue/throat swelling, SOB or lightheadedness with hypotension patient had a PCN reaction causing severe rash involving mucus membranes or skin necrosis: [id number]} Has patient had a PCN reaction that required hospitalization/No Has patient had a PCN reaction occurring within the last 10 years: No If all of the above answers are NO, then may proceed with Cephalosporin use.      Codeine  Nausea Only     Tolerates tylenol  with codeine    Hydrocodone  Nausea And Vomiting   Nickel Hives and Rash   Pregabalin Itching and Other (See Comments)    headaches/problem w/vision   Objective:  There were no vitals filed for this visit. There is no height or weight on file to calculate BMI. Constitutional Well developed. Well nourished. Oriented to person, place, and time.  Vascular Dorsalis pedis pulses palpable bilaterally. Posterior tibial pulses palpable bilaterally. Capillary refill normal to all digits.  No cyanosis or clubbing noted. Pedal hair growth normal.  Neurologic Normal speech. Epicritic sensation to light touch grossly present bilaterally. Negative tinel sign at tarsal tunnel bilaterally.   Dermatologic Skin texture and turgor are within normal limits.  Fresh epithelial skin on the dorsal aspect of the third toe with overlying dead skin which was debrided today.  No drainage, no malodor.  Edema to third digit. Mild edema and erythema to the forefoot. No open wounds.  No fluctuance or crepitus. Toenail is stable.  No other open lesions.   Musculoskeletal: 5/5 muscle strength, rectus foot shape. Tender to palpation of dorsal forefoot.        Radiographs: Taken and reviewed.  3 views of the right foot were taken today.  These do not demonstrate any acute osseous pathology.  No cortical erosions, periosteal reaction, or other signs of osteomyelitis identified at the right third digit. No soft tissue emphysema. There is medial deviation of the third digit at the distal interphalangeal joint.  Assessment:   1. Foot pain, right   2. Cellulitis of right foot     Plan:  - Patient was evaluated and treated and all questions answered.  Blood blister dorsal right third toe, improving -Discussed diagnosis of blood blister with patient.  She expresses understanding of this.  I discussed that there is some minor cellulitis at this time, it is possible that clindamycin  did not cover the  correct bacteria.  Today, ordered doxycycline  100mg  BID  x 10 days to the patient's pharmacy.  - Patient to keep the area clean, dry.  She is going to dress it with Betadine  and dry sterile gauze once per day.  She will watch the area and was educated on signs of infection.  She will let us  know if any of these occur or symptoms worsen. - Return in 2 weeks   Return in about 2 weeks (around 04/23/2024).  Prentice Ovens, DPM AACFAS Fellowship Trained Podiatric Surgeon [facility]

## 2024-04-09 NOTE — Telephone Encounter (Signed)
 Called Mrs. Korf back about the status of her refill on anastrazole. [facility] does not call patients when prescriptions are ready but they do send [employer]. She is active on mychart so I advised her to check that. I also told her that if her mychart was not set up to receive messages or provide notifications that our pharmacy could help her out with that. She stated that she will go today to pick up the medication.  Andrea CHRISTELLA Plunk, RN

## 2024-04-13 ENCOUNTER — Telehealth: Payer: Self-pay

## 2024-04-13 ENCOUNTER — Telehealth: Payer: Self-pay | Admitting: Lab

## 2024-04-13 MED ORDER — FLUCONAZOLE 150 MG PO TABS: 150.0000 mg | ORAL_TABLET | Freq: Once | ORAL | 0 refills | Status: AC

## 2024-04-13 NOTE — Telephone Encounter (Signed)
 Patient advised.

## 2024-04-13 NOTE — Telephone Encounter (Signed)
 Patient states you started antibiotics and now has a bad yeast infection would like something called in to cover that.

## 2024-04-13 NOTE — Telephone Encounter (Signed)
   Pre-operative Risk Assessment    Patient Name: [name]  DOB: [birth year] MRN: [id number]   Date of last office visit: 04/19/23 JEREL BALDING, MD Date of next office visit: 06/29/24 JEREL BALDING, MD  Request for Surgical Clearance    Procedure:  CROWN DELIVERED TOOTH #4 [facility] PLACEMENT OF PERMANENT CAP)   Date of Surgery:  Clearance 04/16/24                                Surgeon:  NOT INDICATED Surgeon's Group or Practice Name:  [facility] Phone number:  [phone] Fax number:  [phone]   Type of Clearance Requested:   - Medical  - Pharmacy:  Hold Apixaban  (Eliquis ) and Rivaroxaban  (Xarelto )     Type of Anesthesia:  None    Additional requests/questions:    Signed, [name]   04/13/2024, 11:57 AM

## 2024-04-13 NOTE — Addendum Note (Signed)
 Addended by: MAGDALEN BARTER on: 04/13/2024 09:07 AM   Modules accepted: Orders

## 2024-04-13 NOTE — Telephone Encounter (Signed)
   Patient Name: [name]  DOB: 12/19/1945 MRN: [id number]  Primary Cardiologist: Jerel Balding, MD  Chart reviewed as part of pre-operative protocol coverage.   Simple dental extractions and procedures (i.e. 1-2 teeth) are considered low risk procedures per guidelines and generally do not require any specific cardiac clearance. It is also generally accepted that for simple extractions and dental cleanings, there is no need to interrupt blood thinner therapy.  SBE prophylaxis is not required for the patient from a cardiac standpoint.  I will route this recommendation to the requesting party via Epic fax function and remove from pre-op pool.  Please call with questions.  Freada Twersky D Butler Vegh, NP 04/13/2024, 12:23 PM

## 2024-04-14 ENCOUNTER — Ambulatory Visit (INDEPENDENT_AMBULATORY_CARE_PROVIDER_SITE_OTHER)

## 2024-04-14 ENCOUNTER — Encounter: Payer: Self-pay | Admitting: Speech Pathology

## 2024-04-14 ENCOUNTER — Other Ambulatory Visit (HOSPITAL_COMMUNITY): Payer: Self-pay

## 2024-04-14 ENCOUNTER — Ambulatory Visit: Admitting: Speech Pathology

## 2024-04-14 ENCOUNTER — Other Ambulatory Visit: Payer: Self-pay

## 2024-04-14 DIAGNOSIS — Z79811 Long term (current) use of aromatase inhibitors: Secondary | ICD-10-CM | POA: Diagnosis not present

## 2024-04-14 DIAGNOSIS — E1151 Type 2 diabetes mellitus with diabetic peripheral angiopathy without gangrene: Secondary | ICD-10-CM | POA: Diagnosis present

## 2024-04-14 DIAGNOSIS — I7090 Unspecified atherosclerosis: Secondary | ICD-10-CM | POA: Diagnosis not present

## 2024-04-14 DIAGNOSIS — E11628 Type 2 diabetes mellitus with other skin complications: Secondary | ICD-10-CM | POA: Diagnosis not present

## 2024-04-14 DIAGNOSIS — E669 Obesity, unspecified: Secondary | ICD-10-CM | POA: Diagnosis present

## 2024-04-14 DIAGNOSIS — L97519 Non-pressure chronic ulcer of other part of right foot with unspecified severity: Secondary | ICD-10-CM | POA: Diagnosis not present

## 2024-04-14 DIAGNOSIS — R1313 Dysphagia, pharyngeal phase: Secondary | ICD-10-CM | POA: Insufficient documentation

## 2024-04-14 DIAGNOSIS — E1140 Type 2 diabetes mellitus with diabetic neuropathy, unspecified: Secondary | ICD-10-CM | POA: Diagnosis present

## 2024-04-14 DIAGNOSIS — I272 Pulmonary hypertension, unspecified: Secondary | ICD-10-CM | POA: Diagnosis present

## 2024-04-14 DIAGNOSIS — Z923 Personal history of irradiation: Secondary | ICD-10-CM | POA: Diagnosis not present

## 2024-04-14 DIAGNOSIS — M86171 Other acute osteomyelitis, right ankle and foot: Secondary | ICD-10-CM | POA: Diagnosis not present

## 2024-04-14 DIAGNOSIS — E1159 Type 2 diabetes mellitus with other circulatory complications: Secondary | ICD-10-CM | POA: Diagnosis present

## 2024-04-14 DIAGNOSIS — Z9889 Other specified postprocedural states: Secondary | ICD-10-CM | POA: Diagnosis not present

## 2024-04-14 DIAGNOSIS — L089 Local infection of the skin and subcutaneous tissue, unspecified: Secondary | ICD-10-CM | POA: Diagnosis not present

## 2024-04-14 DIAGNOSIS — Z6835 Body mass index (BMI) 35.0-35.9, adult: Secondary | ICD-10-CM | POA: Diagnosis not present

## 2024-04-14 DIAGNOSIS — E11621 Type 2 diabetes mellitus with foot ulcer: Secondary | ICD-10-CM | POA: Diagnosis not present

## 2024-04-14 DIAGNOSIS — E0590 Thyrotoxicosis, unspecified without thyrotoxic crisis or storm: Secondary | ICD-10-CM | POA: Diagnosis present

## 2024-04-14 DIAGNOSIS — Z833 Family history of diabetes mellitus: Secondary | ICD-10-CM | POA: Diagnosis not present

## 2024-04-14 DIAGNOSIS — E1169 Type 2 diabetes mellitus with other specified complication: Secondary | ICD-10-CM | POA: Diagnosis not present

## 2024-04-14 DIAGNOSIS — E039 Hypothyroidism, unspecified: Secondary | ICD-10-CM | POA: Diagnosis not present

## 2024-04-14 DIAGNOSIS — I480 Paroxysmal atrial fibrillation: Secondary | ICD-10-CM | POA: Diagnosis not present

## 2024-04-14 DIAGNOSIS — Z91040 Latex allergy status: Secondary | ICD-10-CM | POA: Diagnosis not present

## 2024-04-14 DIAGNOSIS — I152 Hypertension secondary to endocrine disorders: Secondary | ICD-10-CM | POA: Diagnosis present

## 2024-04-14 DIAGNOSIS — M86671 Other chronic osteomyelitis, right ankle and foot: Secondary | ICD-10-CM | POA: Diagnosis not present

## 2024-04-14 DIAGNOSIS — I70235 Atherosclerosis of native arteries of right leg with ulceration of other part of foot: Secondary | ICD-10-CM | POA: Diagnosis present

## 2024-04-14 DIAGNOSIS — M79671 Pain in right foot: Secondary | ICD-10-CM | POA: Diagnosis not present

## 2024-04-14 DIAGNOSIS — S91104A Unspecified open wound of right lesser toe(s) without damage to nail, initial encounter: Secondary | ICD-10-CM | POA: Diagnosis not present

## 2024-04-14 DIAGNOSIS — J4550 Severe persistent asthma, uncomplicated: Secondary | ICD-10-CM

## 2024-04-14 DIAGNOSIS — Z8249 Family history of ischemic heart disease and other diseases of the circulatory system: Secondary | ICD-10-CM | POA: Diagnosis not present

## 2024-04-14 DIAGNOSIS — J4540 Moderate persistent asthma, uncomplicated: Secondary | ICD-10-CM | POA: Diagnosis present

## 2024-04-14 DIAGNOSIS — Z7951 Long term (current) use of inhaled steroids: Secondary | ICD-10-CM | POA: Diagnosis not present

## 2024-04-14 DIAGNOSIS — I10 Essential (primary) hypertension: Secondary | ICD-10-CM | POA: Diagnosis not present

## 2024-04-14 DIAGNOSIS — Z7901 Long term (current) use of anticoagulants: Secondary | ICD-10-CM | POA: Diagnosis not present

## 2024-04-14 NOTE — Therapy (Signed)
 OUTPATIENT SPEECH LANGUAGE PATHOLOGY SWALLOW EVALUATION   Patient Name: [name] MRN: [id number] DOB:1945/08/19, 78 y.o., female Today's Date: 04/14/2024  PCP: Avelina Greig BRAVO, MD REFERRING PROVIDER: Okey Burns, MD  END OF SESSION:  End of Session - 04/14/24 0939     Visit Number 1    Number of Visits 1    Date for Recertification  --    Authorization Type Medicare    SLP Start Time 0932    SLP Stop Time  1015    SLP Time Calculation (min) 43 min    Activity Tolerance Patient tolerated treatment well          Past Medical History:  Diagnosis Date   Allergic rhinitis    Allergy    Arthritis    Asthma    Breast cancer (HCC)    Chronic headache    Diabetes mellitus    Ductal carcinoma in situ (DCIS) of left breast 02/23/2022   DVT (deep venous thrombosis) (HCC)    Dyspnea    Heart murmur    History of radiation therapy    Left Breast 05/02/22-05/30/22- Dr. Lynwood Nasuti   Hypertension    Penicillin allergy 01/19/2020   Pulmonary embolism (HCC)    Pulmonary hypertension (HCC)    Seizures (HCC)    Past Surgical History:  Procedure Laterality Date   ABDOMINAL HYSTERECTOMY     partial, has ovaries   BREAST LUMPECTOMY WITH RADIOACTIVE SEED LOCALIZATION Left 03/29/2022   Procedure: LEFT BREAST LUMPECTOMY WITH RADIOACTIVE SEED LOCALIZATION;  Surgeon: Vanderbilt Ned, MD;  Location: [facility] OR;  Service: General;  Laterality: Left;   CARDIAC CATHETERIZATION  12/28/2010   Mod. pulmonary hypertension, normal coronary arteries   CARDIOVERSION N/A 11/23/2020   Procedure: CARDIOVERSION;  Surgeon: Francyne Headland, MD;  Location: [facility];  Service: Cardiovascular;  Laterality: N/A;   CHOLECYSTECTOMY N/A 12/20/2021   Procedure: LAPAROSCOPIC CHOLECYSTECTOMY WITH INTRAOPERATIVE CHOLANGIOGRAM;  Surgeon: Eletha Boas, MD;  Location: [facility];  Service: General;  Laterality: N/A;   DOPPLER ECHOCARDIOGRAPHY  10/08/2011   EF=>55%,mild asymmetric LVH, mod. TR, mod. PH, mild to mod  LA dilatation   ESOPHAGEAL MANOMETRY N/A 06/19/2023   Procedure: ESOPHAGEAL MANOMETRY (EM);  Surgeon: Saintclair Jasper, MD;  Location: [facility];  Service: Gastroenterology;  Laterality: N/A;   IR LUMBAR DISC ASPIRATION W/IMG GUIDE  01/12/2020   KNEE ARTHROSCOPY Left    KNEE SURGERY     Nuclear Stress Test  05/20/2006   No ischemia   PARTIAL HYSTERECTOMY     PLANTAR FASCIA SURGERY     TONSILLECTOMY     Patient Active Problem List   Diagnosis Date Noted   Right arm pain 03/20/2024   Acute cough 03/06/2024   Balance problem 01/31/2024   Right foot pain 11/27/2023   Headache 11/22/2023   Acute insomnia 09/26/2023   Sprain of anterior talofibular ligament of right ankle 07/02/2022   SSS (sick sinus syndrome) (HCC) 04/26/2022   Family history of breast cancer 02/28/2022   Family history of colon cancer in mother 02/28/2022   Ductal carcinoma in situ (DCIS) of left breast 02/23/2022   Family history of malignant neoplasm of digestive organs 02/02/2022   Anemia 12/29/2021   Chronic constipation 12/29/2021   Chronic anticoagulation - Xerelto 12/18/2021   Hyperthyroidism 12/18/2021   Status post total left knee replacement 12/09/2021   Presence of right artificial knee joint 10/30/2021   Constipation by delayed colonic transit 09/25/2021   Atypical atrial flutter (HCC) 06/24/2021   Chronic diastolic  heart failure (HCC) 06/24/2021   Type 2 diabetes mellitus with diabetic neuropathy, unspecified (HCC) 06/20/2020   Dysphonia 04/08/2020   Discitis of lumbosacral region 01/04/2020   Hyperlipidemia associated with type 2 diabetes mellitus (HCC) 05/07/2019   Dizziness 05/07/2019   Obstructive sleep apnea treated with continuous positive airway pressure (CPAP) 06/03/2018   Left thyroid  nodule 02/06/2018   Insomnia secondary to chronic pain 01/26/2018   Class 3 drug-induced obesity with serious comorbidity and body mass index (BMI) of 40.0 to 44.9 in adult (HCC) 12/03/2017   Graves disease  10/15/2017   Bilateral high frequency sensorineural hearing loss 10/24/2016   Subjective tinnitus, bilateral 10/24/2016   Moderate persistent asthma 07/12/2015   Paroxysmal atrial fibrillation (HCC) 06/03/2015   Neuropathy due to secondary diabetes (HCC) 01/11/2015   Migraine headache without aura 08/24/2014   Allergic sinusitis 03/25/2014   DVT (deep venous thrombosis) hx of  03/25/2014   Osteoarthritis of right knee 03/25/2014    ? of Seizure disorder 03/25/2014   Depression, major, in remission 03/25/2014   Hypertension associated with diabetes (HCC) 03/25/2014   GERD (gastroesophageal reflux disease) 03/25/2014   History of DVT (deep vein thrombosis) 03/25/2014   History of pulmonary embolism 10/09/2013   Asthma, moderate persistent 01/24/2011   Pulmonary hypertension (HCC) 01/11/2011    ONSET DATE: 02/07/2024 (referral date)  REFERRING DIAG: R13.13 (ICD-10-CM) - Pharyngeal dysphagia  THERAPY DIAG:  Dysphagia, pharyngeal phase  Rationale for Evaluation and Treatment: Rehabilitation  SUBJECTIVE:   SUBJECTIVE STATEMENT: I've been trying to not clear my throat Pt accompanied by: friend Claretta  PERTINENT HISTORY: Pt known to us  from prior course of ST for pharyngeal dysphagia and dysphonia - chronic throat clearing and LPR. She attended ST 10/23/23 to 11/25/23 - not discharged, she stopped attending ST. Today, she is referred by ENT for dysphagia.   PAIN:  Are you having pain? Yes: NPRS scale: 10/10 Pain location: right knee and foot Pain description: ache Aggravating factors: the weather Relieving factors: rest, meds  FALLS: Has patient fallen in last 6 months?  No  LIVING ENVIRONMENT: Lives with: lives alone Lives in: House/apartment  PLOF:  Level of assistance: Independent with ADLs, Independent with IADLs Employment: Retired  PATIENT GOALS:   OBJECTIVE:  Note: Objective measures were completed at Evaluation unless otherwise noted. OBJECTIVE:   DIAGNOSTIC  FINDINGS: Laryngoscopy 02/07/24: The nasopharynx was also patent without mass or lesion. The base of tongue was visualized and was normal. There were no signs of pooling of secretions in the piriform sinuses. The true vocal folds were mobile bilaterally. There were no signs of glottic or supraglottic mucosal lesion or mass. There was moderate interarytenoid pachydermia and post cricoid edema.  Chronic throat clearing  Gastroesophageal reflux disease with esophageal dysmotility on swallow study, as well as pharyngeal dysphagia with trace aspiration of thins. Flexible scope exam with GERD LPR findings, but no pooling of secretions and closure of the VF is complete. Already on PPI and H2 blocker for GERD LPR - Continue current regimen of Pantoprazole  and Pepcid   -  Reflux Gourmet after meals - diet and lifestyle changes to minimize GERD - Refer to [employer] blog for dietary and lifestyle modifications/reflux cook book  INSTRUMENTAL SWALLOW STUDY FINDINGS (MBSS) March 2025 Objective swallow impairments: Clinical Impression: Clinical Impression: Patient presents with mild pharyngeal dysphagia mostly characterized by inadequate laryngeal elevation and closure resulting in minimal trace aspiration of thin liquids on an inconsistent basis.  Chin tuck posture not helpful as it allowed barium  to spill into open larynx during the swallow.   Using thin liquid to wash down food worsened penetration and aspiration.  Though patient does not sense trace aspiration cued cough is effective to help her to clear.  Pharyngeal swallow is strong fortunately.  Trace retention with liquids at vallecula and piriform sinus likely due to pressure issue from known esophageal deficits.    Recommendations/Plan: Swallowing Evaluation Recommendations Swallowing Evaluation Recommendations Recommendations: PO diet PO Diet Recommendation: Regular; Thin liquids (Level 0) (Slightly thicker liquids during meals may be more  comfortable, avoid washing down food with thin liquids) Liquid Administration via: Cup; Straw Medication Administration: Whole meds with puree (Start and follow with liquids) Supervision: Patient able to self-feed Swallowing strategies  : Slow rate; Small bites/sips (Clear throat or cough on occasion especially after thin liquid) Postural changes: Position pt fully upright for meals; Stay upright 30-60 min after meals Oral care recommendations: Oral care BID (2x/day)  COGNITION: Overall cognitive status: Within functional limits for tasks assessed  SUBJECTIVE DYSPHAGIA REPORTS:  Date of onset: April 2025 (last course of ST) Reported symptoms: globus sensation and takes pills with applesauce  Current diet: regular and thin liquids  Co-morbid voice changes: No voice improved   FACTORS WHICH MAY INCREASE RISK OF ADVERSE EVENT IN PRESENCE OF ASPIRATION:  General health: poor general health  Risk factors: none evident     ORAL MOTOR EXAMINATION: Overall status: WFL Comments: recent dental work, chewing on left side only for a short time  CLINICAL SWALLOW ASSESSMENT:   Dentition: adequate natural dentition Vocal quality at baseline: normal Patient directly observed with POs: Yes: thin liquids  Feeding: able to feed self Liquids provided by: cup and straw [facility] Swallow Protocol: Pass Oral phase signs and symptoms: n/a Pharyngeal phase signs and symptoms: n/a                                                                                                                             TREATMENT DATE:   04/14/24: CSE completed - reviewed prior dysphagia HEP - pt completed 2 sets of 5 reps (10 total) of effortful swallow, mendelson, masako, and tongue press with mod I after initial verbal cues and modeling. Instructed her to complete HEP twice daily for 6 weeks, then 1 daily 5/7 days. She is following basic reflux precautions. Provided information re: Reflux Gourment as recommended by ENT. As  pt proficient with HEP and swallow precautions, skilled ST not recommended    PATIENT EDUCATION: Education details: HEP for dysphagia, swallow precautions, continue throat clear elimination Person educated: Patient Education method: Explanation, Demonstration, Verbal cues, and Handouts Education comprehension: verbalized understanding and returned demonstration   ASSESSMENT:  CLINICAL IMPRESSION: Patient is a 78 y.o. female who was seen today for pharyngeal dysphagia. H/o mild pharyngeal and esophageal dysphagia. Known to us  from prior course of ST. Today, she denies difficulty swallowing. Globus sensation and phlegm in her throat have resolved. She is tolerating all consistencies  without difficulty. Nakyiah continues to follow swallow precautions from prior MBSS. She is following general reflux precautions. She denies difficulty swallowing more difficulty foods including chips, popcorn and steak. Today, her voice is normal with WNL volume. Conversation averaged 74dB. No throat clearing today. Labresha endorses she continues to be mindful about suppressing throat clears by swallowing. She completes swallowing exercises when I think of it.  Today, she demonstrated HEP for dysphagia with mod I - Instructed her to complete HEP twice daily for 6 weeks, then 1x daily 5/7 day. No f/u with ST is recommended at this time. Pt is in agreement     Mylo Driskill, Leita Caldron, CCC-SLP 04/14/2024, 11:48 AM

## 2024-04-14 NOTE — Patient Instructions (Signed)
   The pepcid  gets rid of the acid, however it does not stop the reflux from coming up.  Reflux even without acid can irritate your throat.   Dr. Okey recommended Reflux Gourmet, an all  natural supplement that blocks reflux from coming up  You order it on [employer] and take 1 tsp after each meal and before bed. This blocks reflux from coming up into your throat  Gaviscon is also similar and available at [place] - take 4 tsp after each meal and before bed or chew 4 tablets after each meal and before bed  SWALLOWING EXERCISES Effortful Swallows - Squeeze hard with the muscles in your neck while you swallow your  saliva or a sip of water - Repeat 20 times, 2-3 times a day, and use whenever you eat or drink  Masako Swallow - swallow with your tongue sticking out - Stick tongue out and gently bite tongue with your teeth - Swallow, while holding your tongue with your teeth - Repeat 20 times, 2-3 times a day   [employer] -  swallow as tight as you  for 5 seconds - Start to swallow, and keep your Adam's apple up by squeezing tight with the muscles of the throat - Hold the squeeze for 5-7 seconds and then relax - Repeat 20 times, 2-3 times a day   Tongue Press - Press your entire tongue as hard as you can against the roof of your mouth for 3-5 seconds - Repeat 20 times, 2-3 times a day

## 2024-04-16 ENCOUNTER — Telehealth: Payer: Self-pay | Admitting: *Deleted

## 2024-04-16 NOTE — Telephone Encounter (Signed)
 Patient returned call and has been scheduled for 05/15/24 for fasting labs and 05/22/24 for Physical with Dr. Avelina

## 2024-04-16 NOTE — Telephone Encounter (Signed)
 Copied from CRM #[id number]. Topic: Appointments - Appointment Info/Confirmation >> Apr 16, 2024  9:50 AM Rosina BIRCH wrote: Patient/patient representative is calling for information regarding an appointment.   Patient would like to be called concerning a physical  [phone]

## 2024-04-16 NOTE — Telephone Encounter (Signed)
 Please schedule CPE with fasting labs prior with Dr. Avelina after her MWV on 05/20/24.

## 2024-04-17 ENCOUNTER — Inpatient Hospital Stay (HOSPITAL_COMMUNITY)

## 2024-04-17 ENCOUNTER — Other Ambulatory Visit: Payer: Self-pay

## 2024-04-17 ENCOUNTER — Emergency Department (HOSPITAL_BASED_OUTPATIENT_CLINIC_OR_DEPARTMENT_OTHER)

## 2024-04-17 ENCOUNTER — Inpatient Hospital Stay (HOSPITAL_BASED_OUTPATIENT_CLINIC_OR_DEPARTMENT_OTHER)
Admission: EM | Admit: 2024-04-17 | Discharge: 2024-04-23 | DRG: 240 | Disposition: A | Discharge status: Home / Self Care | Attending: Internal Medicine | Admitting: Internal Medicine

## 2024-04-17 DIAGNOSIS — Z833 Family history of diabetes mellitus: Secondary | ICD-10-CM

## 2024-04-17 DIAGNOSIS — Z811 Family history of alcohol abuse and dependence: Secondary | ICD-10-CM

## 2024-04-17 DIAGNOSIS — L089 Local infection of the skin and subcutaneous tissue, unspecified: Principal | ICD-10-CM | POA: Diagnosis present

## 2024-04-17 DIAGNOSIS — E11621 Type 2 diabetes mellitus with foot ulcer: Secondary | ICD-10-CM | POA: Diagnosis present

## 2024-04-17 DIAGNOSIS — E039 Hypothyroidism, unspecified: Secondary | ICD-10-CM | POA: Diagnosis not present

## 2024-04-17 DIAGNOSIS — Z820 Family history of epilepsy and other diseases of the nervous system: Secondary | ICD-10-CM

## 2024-04-17 DIAGNOSIS — E11628 Type 2 diabetes mellitus with other skin complications: Secondary | ICD-10-CM | POA: Diagnosis present

## 2024-04-17 DIAGNOSIS — G4733 Obstructive sleep apnea (adult) (pediatric): Secondary | ICD-10-CM | POA: Diagnosis present

## 2024-04-17 DIAGNOSIS — Z803 Family history of malignant neoplasm of breast: Secondary | ICD-10-CM

## 2024-04-17 DIAGNOSIS — Z86718 Personal history of other venous thrombosis and embolism: Secondary | ICD-10-CM

## 2024-04-17 DIAGNOSIS — J4540 Moderate persistent asthma, uncomplicated: Secondary | ICD-10-CM | POA: Diagnosis present

## 2024-04-17 DIAGNOSIS — Z91040 Latex allergy status: Secondary | ICD-10-CM | POA: Diagnosis not present

## 2024-04-17 DIAGNOSIS — Z880 Allergy status to penicillin: Secondary | ICD-10-CM

## 2024-04-17 DIAGNOSIS — E7849 Other hyperlipidemia: Secondary | ICD-10-CM | POA: Diagnosis present

## 2024-04-17 DIAGNOSIS — S91104A Unspecified open wound of right lesser toe(s) without damage to nail, initial encounter: Secondary | ICD-10-CM | POA: Diagnosis not present

## 2024-04-17 DIAGNOSIS — I272 Pulmonary hypertension, unspecified: Secondary | ICD-10-CM | POA: Diagnosis present

## 2024-04-17 DIAGNOSIS — L97519 Non-pressure chronic ulcer of other part of right foot with unspecified severity: Secondary | ICD-10-CM | POA: Diagnosis present

## 2024-04-17 DIAGNOSIS — I480 Paroxysmal atrial fibrillation: Secondary | ICD-10-CM | POA: Diagnosis not present

## 2024-04-17 DIAGNOSIS — Z79899 Other long term (current) drug therapy: Secondary | ICD-10-CM

## 2024-04-17 DIAGNOSIS — Z8261 Family history of arthritis: Secondary | ICD-10-CM

## 2024-04-17 DIAGNOSIS — I7090 Unspecified atherosclerosis: Secondary | ICD-10-CM | POA: Diagnosis not present

## 2024-04-17 DIAGNOSIS — Z885 Allergy status to narcotic agent status: Secondary | ICD-10-CM

## 2024-04-17 DIAGNOSIS — Z923 Personal history of irradiation: Secondary | ICD-10-CM | POA: Diagnosis not present

## 2024-04-17 DIAGNOSIS — I10 Essential (primary) hypertension: Secondary | ICD-10-CM | POA: Diagnosis not present

## 2024-04-17 DIAGNOSIS — J310 Chronic rhinitis: Secondary | ICD-10-CM | POA: Diagnosis present

## 2024-04-17 DIAGNOSIS — Z7901 Long term (current) use of anticoagulants: Secondary | ICD-10-CM | POA: Diagnosis not present

## 2024-04-17 DIAGNOSIS — I70235 Atherosclerosis of native arteries of right leg with ulceration of other part of foot: Secondary | ICD-10-CM | POA: Diagnosis present

## 2024-04-17 DIAGNOSIS — E1159 Type 2 diabetes mellitus with other circulatory complications: Secondary | ICD-10-CM | POA: Diagnosis present

## 2024-04-17 DIAGNOSIS — R1313 Dysphagia, pharyngeal phase: Secondary | ICD-10-CM | POA: Diagnosis present

## 2024-04-17 DIAGNOSIS — M79671 Pain in right foot: Secondary | ICD-10-CM | POA: Diagnosis not present

## 2024-04-17 DIAGNOSIS — Z8249 Family history of ischemic heart disease and other diseases of the circulatory system: Secondary | ICD-10-CM

## 2024-04-17 DIAGNOSIS — E0590 Thyrotoxicosis, unspecified without thyrotoxic crisis or storm: Secondary | ICD-10-CM | POA: Diagnosis present

## 2024-04-17 DIAGNOSIS — Z79811 Long term (current) use of aromatase inhibitors: Secondary | ICD-10-CM

## 2024-04-17 DIAGNOSIS — Z832 Family history of diseases of the blood and blood-forming organs and certain disorders involving the immune mechanism: Secondary | ICD-10-CM

## 2024-04-17 DIAGNOSIS — Z9109 Other allergy status, other than to drugs and biological substances: Secondary | ICD-10-CM

## 2024-04-17 DIAGNOSIS — Z6835 Body mass index (BMI) 35.0-35.9, adult: Secondary | ICD-10-CM

## 2024-04-17 DIAGNOSIS — E118 Type 2 diabetes mellitus with unspecified complications: Secondary | ICD-10-CM | POA: Diagnosis present

## 2024-04-17 DIAGNOSIS — E1169 Type 2 diabetes mellitus with other specified complication: Secondary | ICD-10-CM | POA: Diagnosis present

## 2024-04-17 DIAGNOSIS — E1151 Type 2 diabetes mellitus with diabetic peripheral angiopathy without gangrene: Secondary | ICD-10-CM | POA: Diagnosis present

## 2024-04-17 DIAGNOSIS — E1140 Type 2 diabetes mellitus with diabetic neuropathy, unspecified: Secondary | ICD-10-CM | POA: Diagnosis present

## 2024-04-17 DIAGNOSIS — E669 Obesity, unspecified: Secondary | ICD-10-CM | POA: Diagnosis present

## 2024-04-17 DIAGNOSIS — E0500 Thyrotoxicosis with diffuse goiter without thyrotoxic crisis or storm: Secondary | ICD-10-CM | POA: Diagnosis present

## 2024-04-17 DIAGNOSIS — Z823 Family history of stroke: Secondary | ICD-10-CM

## 2024-04-17 DIAGNOSIS — Z90711 Acquired absence of uterus with remaining cervical stump: Secondary | ICD-10-CM

## 2024-04-17 DIAGNOSIS — Z86711 Personal history of pulmonary embolism: Secondary | ICD-10-CM

## 2024-04-17 DIAGNOSIS — M86171 Other acute osteomyelitis, right ankle and foot: Secondary | ICD-10-CM | POA: Diagnosis present

## 2024-04-17 DIAGNOSIS — Z800 Family history of malignant neoplasm of digestive organs: Secondary | ICD-10-CM

## 2024-04-17 DIAGNOSIS — I152 Hypertension secondary to endocrine disorders: Secondary | ICD-10-CM | POA: Diagnosis present

## 2024-04-17 DIAGNOSIS — Z7951 Long term (current) use of inhaled steroids: Secondary | ICD-10-CM | POA: Diagnosis not present

## 2024-04-17 DIAGNOSIS — E1340 Other specified diabetes mellitus with diabetic neuropathy, unspecified: Secondary | ICD-10-CM | POA: Diagnosis present

## 2024-04-17 DIAGNOSIS — Z86000 Personal history of in-situ neoplasm of breast: Secondary | ICD-10-CM

## 2024-04-17 DIAGNOSIS — Z853 Personal history of malignant neoplasm of breast: Secondary | ICD-10-CM

## 2024-04-17 LAB — CBC
HCT: 35.8 % — ABNORMAL LOW (ref 36.0–46.0)
Hemoglobin: 11.9 g/dL — ABNORMAL LOW (ref 12.0–15.0)
MCH: 26.9 pg (ref 26.0–34.0)
MCHC: 33.2 g/dL (ref 30.0–36.0)
MCV: 80.8 fL (ref 80.0–100.0)
Platelets: 246 K/uL (ref 150–400)
RBC: 4.43 MIL/uL (ref 3.87–5.11)
RDW: 15.4 % (ref 11.5–15.5)
WBC: 6.5 K/uL (ref 4.0–10.5)
nRBC: 0 % (ref 0.0–0.2)

## 2024-04-17 LAB — COMPREHENSIVE METABOLIC PANEL WITH GFR
ALT: 7 U/L (ref 0–44)
AST: 20 U/L (ref 15–41)
Albumin: 4 g/dL (ref 3.5–5.0)
Alkaline Phosphatase: 107 U/L (ref 38–126)
Anion gap: 11 (ref 5–15)
BUN: 11 mg/dL (ref 8–23)
CO2: 24 mmol/L (ref 22–32)
Calcium: 9.7 mg/dL (ref 8.9–10.3)
Chloride: 108 mmol/L (ref 98–111)
Creatinine, Ser: 0.88 mg/dL (ref 0.44–1.00)
GFR, Estimated: >60 mL/min (ref >60)
Glucose, Bld: 94 mg/dL (ref 70–99)
Potassium: 4 mmol/L (ref 3.5–5.1)
Sodium: 144 mmol/L (ref 135–145)
Total Bilirubin: 0.8 mg/dL (ref 0.0–1.2)
Total Protein: 6.5 g/dL (ref 6.5–8.1)

## 2024-04-17 LAB — HEMOGLOBIN A1C
Hgb A1c MFr Bld: 5.6 % (ref 4.8–5.6)
Mean Plasma Glucose: 114.02 mg/dL

## 2024-04-17 LAB — CBC WITH DIFFERENTIAL/PLATELET
Abs Immature Granulocytes: 0.04 K/uL (ref 0.00–0.07)
Basophils Absolute: 0 K/uL (ref 0.0–0.1)
Basophils Relative: 1 %
Eosinophils Absolute: 0.1 K/uL (ref 0.0–0.5)
Eosinophils Relative: 1 %
HCT: 36.3 % (ref 36.0–46.0)
Hemoglobin: 12.3 g/dL (ref 12.0–15.0)
Immature Granulocytes: 1 %
Lymphocytes Relative: 24 %
Lymphs Abs: 1.4 K/uL (ref 0.7–4.0)
MCH: 27.5 pg (ref 26.0–34.0)
MCHC: 33.9 g/dL (ref 30.0–36.0)
MCV: 81 fL (ref 80.0–100.0)
Monocytes Absolute: 0.6 K/uL (ref 0.1–1.0)
Monocytes Relative: 10 %
Neutro Abs: 3.8 K/uL (ref 1.7–7.7)
Neutrophils Relative %: 63 %
Platelets: 238 K/uL (ref 150–400)
RBC: 4.48 MIL/uL (ref 3.87–5.11)
RDW: 15.5 % (ref 11.5–15.5)
WBC: 6 K/uL (ref 4.0–10.5)
nRBC: 0 % (ref 0.0–0.2)

## 2024-04-17 LAB — GLUCOSE, CAPILLARY
Glucose-Capillary: 86 mg/dL (ref 70–99)
Glucose-Capillary: 106 mg/dL — ABNORMAL HIGH (ref 70–99)

## 2024-04-17 LAB — LACTIC ACID, PLASMA
Lactic Acid, Venous: 2.7 mmol/L (ref 0.5–1.9)
Lactic Acid, Venous: 1.4 mmol/L (ref 0.5–1.9)

## 2024-04-17 LAB — SEDIMENTATION RATE: Sed Rate: 7 mm/h (ref 0–22)

## 2024-04-17 LAB — CREATININE, SERUM
Creatinine, Ser: 0.89 mg/dL (ref 0.44–1.00)
GFR, Estimated: >60 mL/min (ref >60)

## 2024-04-17 LAB — C-REACTIVE PROTEIN: CRP: <0.5 mg/dL (ref <1.0)

## 2024-04-17 LAB — PREALBUMIN: Prealbumin: 14 mg/dL — ABNORMAL LOW (ref 18–38)

## 2024-04-17 MED ORDER — LACTATED RINGERS IV BOLUS
1000.0000 mL | Freq: Once | INTRAVENOUS | Status: AC
  Administered 2024-04-17: 1000 mL via INTRAVENOUS

## 2024-04-17 MED ORDER — GABAPENTIN 300 MG PO CAPS
600.0000 mg | ORAL_CAPSULE | Freq: Every day | ORAL | Status: DC
  Administered 2024-04-17 – 2024-04-22 (×6): 600 mg via ORAL
  Filled 2024-04-17 (×6): qty 2

## 2024-04-17 MED ORDER — GABAPENTIN 300 MG PO CAPS: 300.0000 mg | ORAL_CAPSULE | Freq: Two times a day (BID) | ORAL | Status: DC

## 2024-04-17 MED ORDER — SODIUM CHLORIDE 0.9 % IV SOLN
2.0000 g | INTRAVENOUS | Status: DC
  Administered 2024-04-17 – 2024-04-21 (×5): 2 g via INTRAVENOUS
  Filled 2024-04-17 (×5): qty 20

## 2024-04-17 MED ORDER — LINEZOLID 600 MG/300ML IV SOLN
600.0000 mg | Freq: Two times a day (BID) | INTRAVENOUS | Status: DC
  Administered 2024-04-18 – 2024-04-21 (×8): 600 mg via INTRAVENOUS
  Filled 2024-04-17 (×11): qty 300

## 2024-04-17 MED ORDER — SODIUM CHLORIDE 0.9 % IV SOLN
1.0000 g | Freq: Once | INTRAVENOUS | Status: AC
  Administered 2024-04-17: 1 g via INTRAVENOUS

## 2024-04-17 MED ORDER — ENOXAPARIN SODIUM 40 MG/0.4ML IJ SOSY
40.0000 mg | PREFILLED_SYRINGE | INTRAMUSCULAR | Status: DC
  Administered 2024-04-17 – 2024-04-23 (×5): 40 mg via SUBCUTANEOUS
  Filled 2024-04-17 (×6): qty 0.4

## 2024-04-17 MED ORDER — BUDESONIDE 0.5 MG/2ML IN SUSP
0.5000 mg | Freq: Every day | RESPIRATORY_TRACT | Status: DC
  Administered 2024-04-18 – 2024-04-23 (×3): 0.5 mg via RESPIRATORY_TRACT
  Filled 2024-04-17 (×7): qty 2

## 2024-04-17 MED ORDER — MONTELUKAST SODIUM 10 MG PO TABS
10.0000 mg | ORAL_TABLET | Freq: Every day | ORAL | Status: DC
  Administered 2024-04-17 – 2024-04-22 (×6): 10 mg via ORAL
  Filled 2024-04-17 (×6): qty 1

## 2024-04-17 MED ORDER — IRBESARTAN 150 MG PO TABS
150.0000 mg | ORAL_TABLET | Freq: Every day | ORAL | Status: DC
  Administered 2024-04-17 – 2024-04-23 (×5): 150 mg via ORAL
  Filled 2024-04-17 (×6): qty 1

## 2024-04-17 MED ORDER — ATORVASTATIN CALCIUM 10 MG PO TABS
10.0000 mg | ORAL_TABLET | Freq: Every day | ORAL | Status: DC
  Administered 2024-04-17 – 2024-04-23 (×5): 10 mg via ORAL
  Filled 2024-04-17 (×7): qty 1

## 2024-04-17 MED ORDER — SODIUM CHLORIDE 0.9 % IV SOLN: 2.0000 g | INTRAVENOUS | Status: DC

## 2024-04-17 MED ORDER — INSULIN ASPART 100 UNIT/ML IJ SOLN
0.0000 [IU] | Freq: Three times a day (TID) | INTRAMUSCULAR | Status: DC
  Administered 2024-04-18 – 2024-04-21 (×3): 1 [IU] via SUBCUTANEOUS
  Administered 2024-04-22: 2 [IU] via SUBCUTANEOUS
  Administered 2024-04-23: 1 [IU] via SUBCUTANEOUS

## 2024-04-17 MED ORDER — GABAPENTIN 300 MG PO CAPS
300.0000 mg | ORAL_CAPSULE | Freq: Two times a day (BID) | ORAL | Status: DC
  Administered 2024-04-18 – 2024-04-23 (×11): 300 mg via ORAL
  Filled 2024-04-17 (×11): qty 1

## 2024-04-17 MED ORDER — METRONIDAZOLE 500 MG/100ML IV SOLN: 500.0000 mg | Freq: Three times a day (TID) | INTRAVENOUS | Status: DC

## 2024-04-17 MED ORDER — METHIMAZOLE 5 MG PO TABS
5.0000 mg | ORAL_TABLET | Freq: Every day | ORAL | Status: DC
Start: 2024-04-17 — End: 2024-04-23
  Administered 2024-04-17 – 2024-04-23 (×5): 5 mg via ORAL
  Filled 2024-04-17 (×7): qty 1

## 2024-04-17 MED ORDER — PANTOPRAZOLE SODIUM 40 MG PO TBEC
40.0000 mg | DELAYED_RELEASE_TABLET | Freq: Two times a day (BID) | ORAL | Status: DC
  Administered 2024-04-17 – 2024-04-23 (×10): 40 mg via ORAL
  Filled 2024-04-17 (×11): qty 1

## 2024-04-17 MED ORDER — GADOBUTROL 1 MMOL/ML IV SOLN
8.0000 mL | Freq: Once | INTRAVENOUS | Status: AC | PRN
Start: 2024-04-17 — End: 2024-04-17
  Administered 2024-04-17: 8 mL via INTRAVENOUS

## 2024-04-17 MED ORDER — LINEZOLID 600 MG/300ML IV SOLN
600.0000 mg | Freq: Once | INTRAVENOUS | Status: AC
  Administered 2024-04-17: 600 mg via INTRAVENOUS
  Filled 2024-04-17: qty 300

## 2024-04-17 MED ORDER — LINEZOLID 600 MG/300ML IV SOLN: 600.0000 mg | Freq: Two times a day (BID) | INTRAVENOUS | Status: DC

## 2024-04-17 MED ORDER — ALBUTEROL SULFATE (2.5 MG/3ML) 0.083% IN NEBU: 2.5000 mg | INHALATION_SOLUTION | Freq: Four times a day (QID) | RESPIRATORY_TRACT | Status: DC | PRN

## 2024-04-17 MED ORDER — ANASTROZOLE 1 MG PO TABS
1.0000 mg | ORAL_TABLET | Freq: Every day | ORAL | Status: DC
  Administered 2024-04-18 – 2024-04-23 (×4): 1 mg via ORAL
  Filled 2024-04-17 (×6): qty 1

## 2024-04-17 MED ORDER — FLUTICASONE FUROATE-VILANTEROL 200-25 MCG/ACT IN AEPB
1.0000 | INHALATION_SPRAY | Freq: Every day | RESPIRATORY_TRACT | Status: DC
Start: 2024-04-17 — End: 2024-04-23
  Administered 2024-04-18 – 2024-04-23 (×3): 1 via RESPIRATORY_TRACT
  Filled 2024-04-17 (×2): qty 28

## 2024-04-17 MED ORDER — METRONIDAZOLE 500 MG/100ML IV SOLN
500.0000 mg | Freq: Three times a day (TID) | INTRAVENOUS | Status: DC
  Administered 2024-04-17 – 2024-04-22 (×15): 500 mg via INTRAVENOUS
  Filled 2024-04-17 (×16): qty 100

## 2024-04-17 NOTE — ED Notes (Signed)
 Called report to [facility].

## 2024-04-17 NOTE — H&P (Signed)
 History and Physical    BRISIA SCHUERMANN FMW:[id number] DOB: 16-Jan-1946 DOA: 04/17/2024  PCP: Avelina Greig BRAVO, MD Patient coming from: Home  Chief Complaint: Right toe ulcer  HPI: ADENIKE SHIDLER is a 78 y.o. female with medical history significant of type 2 diabetes, atrial flutter on Xarelto , DCIS of left breast, Graves' disease, GERD, PE and DVT, hypertension, hyperlipidemia, obesity, obstructive sleep apnea on CPAP, neuropathy, pulmonary hypertension, sick sinus syndrome admitted with complaints of worsening wound increasing pain to the right second toe.  She noticed a blister on her right third toe 3 weeks ago and she was seen by podiatry was placed on antibiotics without any improvement.  She was given clindamycin  and then changed to doxycycline .  In the ED she had a right foot x-ray with no fracture or dislocation.  She was given meropenem and Zyvox and a fluid bolus.  ED physician discussed with Dr. Sandefur with podiatry.  He recommended MRI of the foot right foot.  And admitted for IV antibiotics.  Denied fever or chills nausea vomiting diarrhea abdominal pain urinary complaints chest pain shortness of breath or cough.  Patient has been seen by vascular who recommended to hold Xarelto  and planning for aortogram on Monday, ABIs ordered.  Past Medical History:  Diagnosis Date   Allergic rhinitis    Allergy    Arthritis    Asthma    Breast cancer (HCC)    Chronic headache    Diabetes mellitus    Ductal carcinoma in situ (DCIS) of left breast 02/23/2022   DVT (deep venous thrombosis) (HCC)    Dyspnea    Heart murmur    History of radiation therapy    Left Breast 05/02/22-05/30/22- Dr. Lynwood Nasuti   Hypertension    Penicillin allergy 01/19/2020   Pulmonary embolism (HCC)    Pulmonary hypertension (HCC)    Seizures (HCC)     Past Surgical History:  Procedure Laterality Date   ABDOMINAL HYSTERECTOMY     partial, has ovaries   BREAST LUMPECTOMY WITH RADIOACTIVE SEED LOCALIZATION  Left 03/29/2022   Procedure: LEFT BREAST LUMPECTOMY WITH RADIOACTIVE SEED LOCALIZATION;  Surgeon: Vanderbilt Ned, MD;  Location: [facility] OR;  Service: General;  Laterality: Left;   CARDIAC CATHETERIZATION  12/28/2010   Mod. pulmonary hypertension, normal coronary arteries   CARDIOVERSION N/A 11/23/2020   Procedure: CARDIOVERSION;  Surgeon: Francyne Headland, MD;  Location: [facility];  Service: Cardiovascular;  Laterality: N/A;   CHOLECYSTECTOMY N/A 12/20/2021   Procedure: LAPAROSCOPIC CHOLECYSTECTOMY WITH INTRAOPERATIVE CHOLANGIOGRAM;  Surgeon: Eletha Boas, MD;  Location: [facility];  Service: General;  Laterality: N/A;   DOPPLER ECHOCARDIOGRAPHY  10/08/2011   EF=>55%,mild asymmetric LVH, mod. TR, mod. PH, mild to mod LA dilatation   ESOPHAGEAL MANOMETRY N/A 06/19/2023   Procedure: ESOPHAGEAL MANOMETRY (EM);  Surgeon: Saintclair Jasper, MD;  Location: [facility];  Service: Gastroenterology;  Laterality: N/A;   IR LUMBAR DISC ASPIRATION W/IMG GUIDE  01/12/2020   KNEE ARTHROSCOPY Left    KNEE SURGERY     Nuclear Stress Test  05/20/2006   No ischemia   PARTIAL HYSTERECTOMY     PLANTAR FASCIA SURGERY     TONSILLECTOMY      Social History   Socioeconomic History   Marital status: Widowed    Spouse name: Not on file   Number of children: Y   Years of education: Not on file   Highest education level: Not on file  Occupational History   Occupation: retired Programme researcher, broadcasting/film/video.   Tobacco  Use   Smoking status: Never    Passive exposure: Never   Smokeless tobacco: Never  Vaping Use   Vaping status: Never Used  Substance and Sexual Activity   Alcohol  use: Not Currently    Alcohol /week: 0.0 standard drinks of alcohol     Comment: occ glass on wine   Drug use: No   Sexual activity: Never  Other Topics Concern   Not on file  Social History Narrative   Widow    limited exercise.   Social Drivers of Corporate investment banker Strain: Low Risk  (05/16/2023)   Overall Financial Resource Strain (CARDIA)     Difficulty of Paying Living Expenses: Not hard at all  Food Insecurity: No Food Insecurity (04/17/2024)   Hunger Vital Sign    Worried About Running Out of Food in the Last Year: Never true    Ran Out of Food in the Last Year: Never true  Transportation Needs: No Transportation Needs (04/17/2024)   PRAPARE - Administrator, Civil Service (Medical): No    Lack of Transportation (Non-Medical): No  Physical Activity: Insufficiently Active (05/16/2023)   Exercise Vital Sign    Days of Exercise per Week: 2 days    Minutes of Exercise per Session: 30 min  Stress: No Stress Concern Present (05/16/2023)   [employer] of Occupational Health - Occupational Stress Questionnaire    Feeling of Stress : Only a little  Social Connections: Moderately Isolated (04/17/2024)   Social Connection and Isolation Panel    Frequency of Communication with Friends and Family: More than three times a week    Frequency of Social Gatherings with Friends and Family: Once a week    Attends Religious Services: More than 4 times per year    Active Member of [employer] or Organizations: No    Attends Banker Meetings: Never    Marital Status: Widowed  Intimate Partner Violence: Not At Risk (04/17/2024)   Humiliation, Afraid, Rape, and Kick questionnaire    Fear of Current or Ex-Partner: No    Emotionally Abused: No    Physically Abused: No    Sexually Abused: No    Allergies  Allergen Reactions   Dilantin [Phenytoin] Swelling    facial swelling   Latex Hives   Oxycodone  Nausea And Vomiting and Nausea Only    Abdominal Pain, Vomiting   Penicillins Nausea And Vomiting and Swelling    Has patient had a PCN reaction causing immediate rash, facial/tongue/throat swelling, SOB or lightheadedness with hypotension patient had a PCN reaction causing severe rash involving mucus membranes or skin necrosis: [id number]} Has patient had a PCN reaction that required hospitalization/No Has patient had a  PCN reaction occurring within the last 10 years: No If all of the above answers are NO, then may proceed with Cephalosporin use.      Codeine  Nausea Only    Tolerates tylenol  with codeine    Hydrocodone  Nausea And Vomiting   Nickel Hives and Rash   Pregabalin Itching and Other (See Comments)    headaches/problem w/vision    Family History  Problem Relation Age of Onset   Cancer Mother        breast and colon cancer   Hypertension Mother    Clotting disorder Mother    Breast cancer Mother 70   Arthritis Mother    Stroke Mother    Diabetes Mother    Colon cancer Mother 72 - 23   Alcohol  abuse Father    Cancer  Sister        breast cancer   Multiple sclerosis Sister    Cancer Sister        colon cancer   Cancer Brother        colon cancer   Prostate cancer Half-Brother    Stomach cancer Half-Brother    Breast cancer Half-Sister 35       recurrence at 25, reports positive genetic testing   Arthritis Half-Sister    Diabetes Half-Sister    Colon cancer Half-Sister 54   Allergies Other        grandson   Allergic rhinitis Neg Hx    Angioedema Neg Hx    Asthma Neg Hx    Atopy Neg Hx    Eczema Neg Hx    Immunodeficiency Neg Hx    Urticaria Neg Hx       Prior to Admission medications   Medication Sig Start Date End Date Taking? Authorizing Provider  ADVAIR  HFA 230-21 MCG/ACT inhaler Inhale 2 puffs into the lungs 2 (two) times daily. On empty lungs 03/17/24   Kozlow, Camellia PARAS, MD  albuterol  (PROVENTIL ) (2.5 MG/3ML) 0.083% nebulizer solution Take 3 mLs (2.5 mg total) by nebulization every 6 (six) hours as needed for wheezing or shortness of breath. 03/17/24   Kozlow, Camellia PARAS, MD  albuterol  (VENTOLIN  HFA) 108 (90 Base) MCG/ACT inhaler Inhale 2 puffs into the lungs every 6 (six) hours as needed for wheezing or shortness of breath (Take with Flovent  During Flare Ups.). 03/17/24   Kozlow, Camellia PARAS, MD  anastrozole  (ARIMIDEX ) 1 MG tablet Take 1 tablet (1 mg total) by mouth daily. 04/07/24    Gudena, Vinay, MD  apixaban  (ELIQUIS ) 5 MG TABS tablet Take 1 tablet (5 mg total) by mouth 2 (two) times daily. 04/07/24   Croitoru, Mihai, MD  atorvastatin  (LIPITOR) 10 MG tablet TAKE 1 TABLET(10 MG) BY MOUTH DAILY 07/08/23   Bedsole, Amy E, MD  Blood Glucose Monitoring Suppl (ONETOUCH VERIO) w/Device KIT Use to check blood sugar up to 2 times a day 08/16/20   Bedsole, Amy E, MD  budesonide  (PULMICORT ) 0.5 MG/2ML nebulizer solution Take 2 mLs (0.5 mg total) by nebulization in the morning and at bedtime. 09/23/23   Kozlow, Camellia PARAS, MD  cyclobenzaprine  (FLEXERIL ) 10 MG tablet Take 0.5-1 tablets (5-10 mg total) by mouth at bedtime as needed for muscle spasms. 03/09/22   Bedsole, Amy E, MD  desloratadine  (CLARINEX ) 5 MG tablet TAKE 1 TABLET(5 MG) BY MOUTH DAILY 02/26/24   Kozlow, Camellia PARAS, MD  doxycycline  (VIBRA -TABS) 100 MG tablet Take 1 tablet (100 mg total) by mouth 2 (two) times daily. 04/09/24   Magdalen Prentice PARAS, DPM  Empagliflozin -linaGLIPtin  (GLYXAMBI ) 10-5 MG TABS Take 1 tablet by mouth daily. 04/01/24   Bedsole, Amy E, MD  EPINEPHrine  0.3 mg/0.3 mL IJ SOAJ injection Inject 0.3 mg into the muscle as needed for anaphylaxis. USE AS DIRECTED FOR LIFE THREATENING ALLERGIC REACTIONS 03/17/24   Kozlow, Camellia PARAS, MD  estradiol  (ESTRACE ) 0.1 MG/GM vaginal cream Place 1 Applicatorful vaginally 2 (two) times a week. (As Needed)    [provider]  famotidine  (PEPCID ) 40 MG tablet Take 1 tablet (40 mg total) by mouth at bedtime. Take 1 (ONE) tablet by mouth every evening. 03/17/24   Kozlow, Camellia PARAS, MD  fluticasone  (FLOVENT  HFA) 220 MCG/ACT inhaler Inhale 2 puffs into the lungs every 6 (six) hours as needed. Use with Albuterol  during flare ups. 01/07/24   Kozlow, Camellia PARAS, MD  gabapentin  (NEURONTIN ) 300 MG capsule TAKE 1 CAPSULE BY MOUTH IN THE MORNING, 1 CAPSULE AT LUNCH AND 2 CAPSULES AT BEDTIME 01/20/24   Hyatt, Max T, DPM  glucose blood (ONETOUCH VERIO) test strip 1 strip 2 (two) times daily 01/29/22   [provider]  hydrOXYzine  (ATARAX ) 10 MG tablet Take 1 tablet (10 mg total) by mouth daily as needed for itching. 05/03/23   Bedsole, Amy E, MD  ipratropium (ATROVENT ) 0.06 % nasal spray Place 2 sprays into both nostrils 2 (two) times daily as needed for rhinitis. 05/28/23   Kozlow, Camellia PARAS, MD  irbesartan  (AVAPRO ) 150 MG tablet TAKE 1 TABLET(150 MG) BY MOUTH DAILY 01/09/24   Croitoru, Mihai, MD  Lactase (DAIRY-RELIEF PO) Take 1 capsule by mouth daily as needed (eating dairy).    [provider]  Lancets (ONETOUCH DELICA PLUS LANCET33G) MISC USE TO CHECK BLOOD SUGAR UP TO TWICE DAILY 01/29/22   Bedsole, Amy E, MD  Magnesium 500 MG TABS Take 500 mg by mouth 2 (two) times daily.    [provider]  Menthol , Topical Analgesic, (ABSORBINE PLUS JR EX) Apply 1 patch topically daily as needed (pain).    [provider]  Menthol -Methyl Salicylate (SALONPAS JET SPRAY EX) Apply 1 spray topically daily as needed (knee pain).    [provider]  methimazole  (TAPAZOLE ) 5 MG tablet Take 1 tablet (5 mg total) by mouth daily. 11/05/23   Trixie File, MD  montelukast  (SINGULAIR ) 10 MG tablet Take 1 tablet (10 mg total) by mouth at bedtime. For asthma control. 03/17/24   Kozlow, Camellia PARAS, MD  Mouthwashes (BIOTENE DRY MOUTH MT) Use as directed 1 Dose in the mouth or throat daily as needed (dry mouth).    [provider]  Nebulizer MISC 1 Device by Does not apply route as directed. 02/12/23   Kozlow, Camellia PARAS, MD  ondansetron  (ZOFRAN -ODT) 4 MG disintegrating tablet Take 1 tablet (4 mg total) by mouth every 8 (eight) hours as needed for nausea or vomiting. 02/20/23   Bedsole, Amy E, MD  pantoprazole  (PROTONIX ) 40 MG tablet Take 1 tablet (40 mg total) by mouth 2 (two) times daily. 03/17/24   Kozlow, Camellia PARAS, MD  Plecanatide (TRULANCE) 3 MG TABS Take 3 mg by mouth daily as needed (constipation).    [provider]  Polyethyl Glycol-Propyl Glycol (SYSTANE OP) Place 1 drop into  both eyes daily as needed (dry eyes).    [provider]  polyethylene glycol powder (GLYCOLAX /MIRALAX ) 17 GM/SCOOP powder Take 1 Container by mouth once.    [provider]  povidone-iodine  (BETADINE ) 10 % external solution Apply 1 Application topically as needed for wound care. 12/26/23   Gaynel Delon CROME, DPM  predniSONE  (DELTASONE ) 20 MG tablet 3 tabs by mouth daily x 3 days, then 2 tabs by mouth daily x 2 days then 1 tab by mouth daily x 2 days 03/20/24   Avelina Greig BRAVO, MD  PRESCRIPTION MEDICATION Inhale into the lungs at bedtime. CPAP    [provider]  Respiratory Therapy Supplies (NEBULIZER MASK ADULT) MISC 1 kit by Does not apply route as directed. 01/15/23   Kozlow, Camellia PARAS, MD  rivaroxaban  (XARELTO ) 20 MG TABS tablet Take 1 tablet (20 mg total) by mouth daily with supper. 04/16/23   Croitoru, Mihai, MD  triamcinolone  (NASACORT ) 55 MCG/ACT AERO nasal inhaler Place 2 sprays into the nose daily. 05/28/23   Kozlow, Camellia PARAS, MD  Vibegron  (GEMTESA ) 75 MG TABS 1 tablet  daily 12/02/23   Bedsole, Amy E, MD  XIFAXAN 550 MG TABS tablet Take 550 mg by mouth 3 (three) times daily. 03/09/24   [provider]    Physical Exam: Vitals:   04/17/24 1424 04/17/24 1426 04/17/24 1426 04/17/24 1530  BP:  (!) 158/66  (!) 157/89  Pulse: 78   (!) 57  Resp:    16  Temp:   97.7 F (36.5 C) 97.8 F (36.6 C)  TempSrc:   Oral   SpO2: 100%   100%     General:  Appears calm and comfortable Eyes:  PERRL, EOMI, normal lids, iris ENT:  grossly normal hearing, lips & tongue, mmm Neck:  no LAD, masses or thyromegaly Cardiovascular:  RRR, no m/r/g. No LE edema.  Respiratory:  CTA bilaterally, no w/r/r. Normal respiratory effort. Abdomen:  soft, ntnd, NABS Skin:  no rash or induration seen on limited exam Musculoskeletal:  grossly normal tone BUE/BLE, good ROM, no bony abnormality Ext right 3 rd toe swollen discolored callus right big toe Neurologic:  CN 2-12 grossly intact,  moves all extremities in coordinated fashion, sensation intact  Labs on Admission: I have personally reviewed following labs and imaging studies  CBC: Recent Labs  Lab 04/17/24 1152  WBC 6.0  NEUTROABS 3.8  HGB 12.3  HCT 36.3  MCV 81.0  PLT 238   Basic Metabolic Panel: Recent Labs  Lab 04/17/24 1152  NA 144  K 4.0  CL 108  CO2 24  GLUCOSE 94  BUN 11  CREATININE 0.88  CALCIUM  9.7   GFR: CrCl cannot be calculated (Unknown ideal weight.). Liver Function Tests: Recent Labs  Lab 04/17/24 1152  AST 20  ALT 7  ALKPHOS 107  BILITOT 0.8  PROT 6.5  ALBUMIN  4.0   No results for input(s): LIPASE, AMYLASE in the last 168 hours. No results for input(s): AMMONIA in the last 168 hours. Coagulation Profile: No results for input(s): INR, PROTIME in the last 168 hours. Cardiac Enzymes: No results for input(s): CKTOTAL, CKMB, CKMBINDEX, TROPONINI in the last 168 hours. BNP (last 3 results) No results for input(s): PROBNP in the last 8760 hours. HbA1C: No results for input(s): HGBA1C in the last 72 hours. CBG: Recent Labs  Lab 04/17/24 1635  GLUCAP 86   Lipid Profile: No results for input(s): CHOL, HDL, LDLCALC, TRIG, CHOLHDL, LDLDIRECT in the last 72 hours. Thyroid  Function Tests: No results for input(s): TSH, T4TOTAL, FREET4, T3FREE, THYROIDAB in the last 72 hours. Anemia Panel: No results for input(s): VITAMINB12, FOLATE, FERRITIN, TIBC, IRON, RETICCTPCT in the last 72 hours. Urine analysis:    Component Value Date/Time   COLORURINE COLORLESS (A) 07/04/2023 1610   APPEARANCEUR CLEAR 07/04/2023 1610   LABSPEC <1.005 (L) 07/04/2023 1610   PHURINE 6.5 07/04/2023 1610   GLUCOSEU >1,000 (A) 07/04/2023 1610   HGBUR TRACE (A) 07/04/2023 1610   BILIRUBINUR NEGATIVE 07/04/2023 1610   BILIRUBINUR Negative 05/03/2023 1221   KETONESUR NEGATIVE 07/04/2023 1610   PROTEINUR NEGATIVE 07/04/2023 1610   UROBILINOGEN 0.2  05/03/2023 1221   UROBILINOGEN 1.0 04/13/2019 1544   NITRITE NEGATIVE 07/04/2023 1610   LEUKOCYTESUR NEGATIVE 07/04/2023 1610    Creatinine Clearance: CrCl cannot be calculated (Unknown ideal weight.).  Sepsis Labs: @LABRCNTIP (procalcitonin:4,lacticidven:4) ) Recent Results (from the past 240 hours)  Blood culture (routine x 2)     Status: None (Preliminary result)   Collection Time: 04/17/24 12:04 PM   Specimen: BLOOD RIGHT ARM  Result Value Ref Range Status   Specimen  Description BLOOD RIGHT ARM  Final   Special Requests   Final    BOTTLES DRAWN AEROBIC AND ANAEROBIC Blood Culture adequate volume Performed at [facility], [address], [place], [place] [address]    Culture PENDING  Incomplete   Report Status PENDING  Incomplete  Blood culture (routine x 2)     Status: None (Preliminary result)   Collection Time: 04/17/24 12:09 PM   Specimen: BLOOD LEFT HAND  Result Value Ref Range Status   Specimen Description BLOOD LEFT HAND  Final   Special Requests   Final    BOTTLES DRAWN AEROBIC AND ANAEROBIC Blood Culture adequate volume Performed at [facility], [address] [place], [place] [address]    Culture PENDING  Incomplete   Report Status PENDING  Incomplete     Radiological Exams on Admission: DG Foot Complete Right Result Date: 04/17/2024 CLINICAL DATA:  pain EXAM: RIGHT FOOT COMPLETE - 3+ VIEW COMPARISON:  04/09/2024 FINDINGS: Similar changes of an internal fixation of the fifth metatarsal neck.No acute fracture or dislocation. Bony ankylosis of the interphalangeal joints of all 5 digits. Degenerative spurring of the midfoot. Soft tissues are unremarkable. No radiopaque foreign body. IMPRESSION: No acute fracture or dislocation. Electronically Signed   By: Rogelia Myers M.D.   On: 04/17/2024 13:15    Assessment/Plan Principal Problem:   Diabetic foot infection (HCC) Active Problems:   Hypertension associated with diabetes (HCC)   Neuropathy due  to secondary diabetes (HCC)   Moderate persistent asthma   Obstructive sleep apnea treated with continuous positive airway pressure (CPAP)   Hyperlipidemia associated with type 2 diabetes mellitus (HCC)   Type 2 diabetes mellitus with diabetic neuropathy, unspecified (HCC)   Hyperthyroidism    #1 diabetic foot with infected right third toe-patient admitted  patient admitted with right third toe swelling drainage and pain for the last  3 weeks.  It started with will blister 3 weeks ago.  Failed outpatient management with clindamycin  and doxycycline .  Her pain started to get worse with increased drainage and swelling.  She decided to come to the ED for this.   MRI, ABI pending On Rocephin  Flagyl  and Zyvox Seen by vascular Will consult podiatry ESR CRP  #2 type 2 diabetes controlled hemoglobin A1c this admission is pending Will place her on sliding scale insulin  hold SGLT2 and GLP-1  #3 history of Graves' disease continue methimazole   #4 obstructive sleep apnea on CPAP continue  #5 essential hypertension continue Avapro   #6 asthma with rhinitis continue Singulair  breathing nebs Breo Ellipta.  #7 neuropathy secondary to type 2 diabetes continue Neurontin .  Estimated body mass index is 33.91 kg/m as calculated from the following:   Height as of 03/20/24: 5' 3.5 (1.613 m).   Weight as of 03/20/24: 88.2 kg.   DVT prophylaxis: Lovenox Code Status: Full code Family Communication: None Disposition Plan: Pending  Consults called: Vascular and podiatry Admission status: Inpatient   Almarie KANDICE Hoots MD  04/17/2024, 4:42 PM

## 2024-04-17 NOTE — ED Notes (Signed)
Attempted to call report 1x

## 2024-04-17 NOTE — ED Notes (Signed)
 Called [facility] to transport the patient [name] 5N rm# [phone]

## 2024-04-17 NOTE — Consult Note (Signed)
 Hospital Consult    Reason for Consult: Left toe ulcer Referring Physician: Dr. Will MRN #:  [id number]  History of Present Illness: This is a 78 y.o. female without significant vascular history.  She now has a 3-week history of right toe ulcer.  She has been followed by podiatry.  She presented to [facility] today with worsened pain.  She denies fevers or chills.  She is on Xarelto  last took yesterday.  Past Medical History:  Diagnosis Date   Allergic rhinitis    Allergy    Arthritis    Asthma    Breast cancer (HCC)    Chronic headache    Diabetes mellitus    Ductal carcinoma in situ (DCIS) of left breast 02/23/2022   DVT (deep venous thrombosis) (HCC)    Dyspnea    Heart murmur    History of radiation therapy    Left Breast 05/02/22-05/30/22- Dr. Lynwood Nasuti   Hypertension    Penicillin allergy 01/19/2020   Pulmonary embolism (HCC)    Pulmonary hypertension (HCC)    Seizures (HCC)     Past Surgical History:  Procedure Laterality Date   ABDOMINAL HYSTERECTOMY     partial, has ovaries   BREAST LUMPECTOMY WITH RADIOACTIVE SEED LOCALIZATION Left 03/29/2022   Procedure: LEFT BREAST LUMPECTOMY WITH RADIOACTIVE SEED LOCALIZATION;  Surgeon: Vanderbilt Ned, MD;  Location: [facility] OR;  Service: General;  Laterality: Left;   CARDIAC CATHETERIZATION  12/28/2010   Mod. pulmonary hypertension, normal coronary arteries   CARDIOVERSION N/A 11/23/2020   Procedure: CARDIOVERSION;  Surgeon: Francyne Headland, MD;  Location: [facility];  Service: Cardiovascular;  Laterality: N/A;   CHOLECYSTECTOMY N/A 12/20/2021   Procedure: LAPAROSCOPIC CHOLECYSTECTOMY WITH INTRAOPERATIVE CHOLANGIOGRAM;  Surgeon: Eletha Boas, MD;  Location: [facility];  Service: General;  Laterality: N/A;   DOPPLER ECHOCARDIOGRAPHY  10/08/2011   EF=>55%,mild asymmetric LVH, mod. TR, mod. PH, mild to mod LA dilatation   ESOPHAGEAL MANOMETRY N/A 06/19/2023   Procedure: ESOPHAGEAL MANOMETRY (EM);  Surgeon: Saintclair Jasper, MD;   Location: [facility];  Service: Gastroenterology;  Laterality: N/A;   IR LUMBAR DISC ASPIRATION W/IMG GUIDE  01/12/2020   KNEE ARTHROSCOPY Left    KNEE SURGERY     Nuclear Stress Test  05/20/2006   No ischemia   PARTIAL HYSTERECTOMY     PLANTAR FASCIA SURGERY     TONSILLECTOMY      Allergies  Allergen Reactions   Dilantin [Phenytoin] Swelling    facial swelling   Latex Hives   Oxycodone  Nausea And Vomiting and Nausea Only    Abdominal Pain, Vomiting   Penicillins Nausea And Vomiting and Swelling    Has patient had a PCN reaction causing immediate rash, facial/tongue/throat swelling, SOB or lightheadedness with hypotension patient had a PCN reaction causing severe rash involving mucus membranes or skin necrosis: [id number] Has patient had a PCN reaction that required hospitalization/No Has patient had a PCN reaction occurring within the last 10 years: No If all of the above answers are NO, then may proceed with Cephalosporin use.      Codeine  Nausea Only    Tolerates tylenol  with codeine    Hydrocodone  Nausea And Vomiting   Nickel Hives and Rash   Pregabalin Itching and Other (See Comments)    headaches/problem w/vision    Prior to Admission medications   Medication Sig Start Date End Date Taking? Authorizing Provider  ADVAIR  HFA 230-21 MCG/ACT inhaler Inhale 2 puffs into the lungs 2 (two) times daily. On empty lungs 03/17/24   Kozlow,  Camellia PARAS, MD  albuterol  (PROVENTIL ) (2.5 MG/3ML) 0.083% nebulizer solution Take 3 mLs (2.5 mg total) by nebulization every 6 (six) hours as needed for wheezing or shortness of breath. 03/17/24   Kozlow, Camellia PARAS, MD  albuterol  (VENTOLIN  HFA) 108 (90 Base) MCG/ACT inhaler Inhale 2 puffs into the lungs every 6 (six) hours as needed for wheezing or shortness of breath (Take with Flovent  During Flare Ups.). 03/17/24   Kozlow, Camellia PARAS, MD  anastrozole  (ARIMIDEX ) 1 MG tablet Take 1 tablet (1 mg total) by mouth daily. 04/07/24   Gudena, Vinay, MD  apixaban   (ELIQUIS ) 5 MG TABS tablet Take 1 tablet (5 mg total) by mouth 2 (two) times daily. 04/07/24   Croitoru, Mihai, MD  atorvastatin  (LIPITOR) 10 MG tablet TAKE 1 TABLET(10 MG) BY MOUTH DAILY 07/08/23   Bedsole, Amy E, MD  Blood Glucose Monitoring Suppl (ONETOUCH VERIO) w/Device KIT Use to check blood sugar up to 2 times a day 08/16/20   Bedsole, Amy E, MD  budesonide  (PULMICORT ) 0.5 MG/2ML nebulizer solution Take 2 mLs (0.5 mg total) by nebulization in the morning and at bedtime. 09/23/23   Kozlow, Camellia PARAS, MD  cyclobenzaprine  (FLEXERIL ) 10 MG tablet Take 0.5-1 tablets (5-10 mg total) by mouth at bedtime as needed for muscle spasms. 03/09/22   Bedsole, Amy E, MD  desloratadine  (CLARINEX ) 5 MG tablet TAKE 1 TABLET(5 MG) BY MOUTH DAILY 02/26/24   Kozlow, Camellia PARAS, MD  doxycycline  (VIBRA -TABS) 100 MG tablet Take 1 tablet (100 mg total) by mouth 2 (two) times daily. 04/09/24   Magdalen Prentice PARAS, DPM  Empagliflozin -linaGLIPtin  (GLYXAMBI ) 10-5 MG TABS Take 1 tablet by mouth daily. 04/01/24   Bedsole, Amy E, MD  EPINEPHrine  0.3 mg/0.3 mL IJ SOAJ injection Inject 0.3 mg into the muscle as needed for anaphylaxis. USE AS DIRECTED FOR LIFE THREATENING ALLERGIC REACTIONS 03/17/24   Kozlow, Camellia PARAS, MD  estradiol  (ESTRACE ) 0.1 MG/GM vaginal cream Place 1 Applicatorful vaginally 2 (two) times a week. (As Needed)    [provider]  famotidine  (PEPCID ) 40 MG tablet Take 1 tablet (40 mg total) by mouth at bedtime. Take 1 (ONE) tablet by mouth every evening. 03/17/24   Kozlow, Camellia PARAS, MD  fluticasone  (FLOVENT  HFA) 220 MCG/ACT inhaler Inhale 2 puffs into the lungs every 6 (six) hours as needed. Use with Albuterol  during flare ups. 01/07/24   Kozlow, Camellia PARAS, MD  gabapentin  (NEURONTIN ) 300 MG capsule TAKE 1 CAPSULE BY MOUTH IN THE MORNING, 1 CAPSULE AT LUNCH AND 2 CAPSULES AT BEDTIME 01/20/24   Hyatt, Max T, DPM  glucose blood (ONETOUCH VERIO) test strip 1 strip 2 (two) times daily 01/29/22   [provider]  hydrOXYzine   (ATARAX ) 10 MG tablet Take 1 tablet (10 mg total) by mouth daily as needed for itching. 05/03/23   Bedsole, Amy E, MD  ipratropium (ATROVENT ) 0.06 % nasal spray Place 2 sprays into both nostrils 2 (two) times daily as needed for rhinitis. 05/28/23   Kozlow, Camellia PARAS, MD  irbesartan  (AVAPRO ) 150 MG tablet TAKE 1 TABLET(150 MG) BY MOUTH DAILY 01/09/24   Croitoru, Mihai, MD  Lactase (DAIRY-RELIEF PO) Take 1 capsule by mouth daily as needed (eating dairy).    [provider]  Lancets (ONETOUCH DELICA PLUS LANCET33G) MISC USE TO CHECK BLOOD SUGAR UP TO TWICE DAILY 01/29/22   Bedsole, Amy E, MD  Magnesium 500 MG TABS Take 500 mg by mouth 2 (two) times daily.    [provider]  Menthol ,  Topical Analgesic, (ABSORBINE PLUS JR EX) Apply 1 patch topically daily as needed (pain).    [provider]  Menthol -Methyl Salicylate (SALONPAS JET SPRAY EX) Apply 1 spray topically daily as needed (knee pain).    [provider]  methimazole  (TAPAZOLE ) 5 MG tablet Take 1 tablet (5 mg total) by mouth daily. 11/05/23   Trixie File, MD  montelukast  (SINGULAIR ) 10 MG tablet Take 1 tablet (10 mg total) by mouth at bedtime. For asthma control. 03/17/24   Kozlow, Camellia PARAS, MD  Mouthwashes (BIOTENE DRY MOUTH MT) Use as directed 1 Dose in the mouth or throat daily as needed (dry mouth).    [provider]  Nebulizer MISC 1 Device by Does not apply route as directed. 02/12/23   Kozlow, Camellia PARAS, MD  ondansetron  (ZOFRAN -ODT) 4 MG disintegrating tablet Take 1 tablet (4 mg total) by mouth every 8 (eight) hours as needed for nausea or vomiting. 02/20/23   Bedsole, Amy E, MD  pantoprazole  (PROTONIX ) 40 MG tablet Take 1 tablet (40 mg total) by mouth 2 (two) times daily. 03/17/24   Kozlow, Camellia PARAS, MD  Plecanatide (TRULANCE) 3 MG TABS Take 3 mg by mouth daily as needed (constipation).    [provider]  Polyethyl Glycol-Propyl Glycol (SYSTANE OP) Place 1 drop into both eyes daily as needed (dry  eyes).    [provider]  polyethylene glycol powder (GLYCOLAX /MIRALAX ) 17 GM/SCOOP powder Take 1 Container by mouth once.    [provider]  povidone-iodine  (BETADINE ) 10 % external solution Apply 1 Application topically as needed for wound care. 12/26/23   Gaynel Delon CROME, DPM  predniSONE  (DELTASONE ) 20 MG tablet 3 tabs by mouth daily x 3 days, then 2 tabs by mouth daily x 2 days then 1 tab by mouth daily x 2 days 03/20/24   Avelina Greig BRAVO, MD  PRESCRIPTION MEDICATION Inhale into the lungs at bedtime. CPAP    [provider]  Respiratory Therapy Supplies (NEBULIZER MASK ADULT) MISC 1 kit by Does not apply route as directed. 01/15/23   Kozlow, Camellia PARAS, MD  rivaroxaban  (XARELTO ) 20 MG TABS tablet Take 1 tablet (20 mg total) by mouth daily with supper. 04/16/23   Croitoru, Mihai, MD  triamcinolone  (NASACORT ) 55 MCG/ACT AERO nasal inhaler Place 2 sprays into the nose daily. 05/28/23   Kozlow, Camellia PARAS, MD  Vibegron  (GEMTESA ) 75 MG TABS 1 tablet daily 12/02/23   Bedsole, Amy E, MD  XIFAXAN 550 MG TABS tablet Take 550 mg by mouth 3 (three) times daily. 03/09/24   [provider]    Social History   Socioeconomic History   Marital status: Widowed    Spouse name: Not on file   Number of children: Y   Years of education: Not on file   Highest education level: Not on file  Occupational History   Occupation: retired Programme researcher, broadcasting/film/video.   Tobacco Use   Smoking status: Never    Passive exposure: Never   Smokeless tobacco: Never  Vaping Use   Vaping status: Never Used  Substance and Sexual Activity   Alcohol  use: Not Currently    Alcohol /week: 0.0 standard drinks of alcohol     Comment: occ glass on wine   Drug use: No   Sexual activity: Never  Other Topics Concern   Not on file  Social History Narrative   Widow    limited exercise.   Social Drivers of Health   Financial Resource Strain: Low Risk  (05/16/2023)  Overall Financial Resource Strain (CARDIA)     Difficulty of Paying Living Expenses: Not hard at all  Food Insecurity: No Food Insecurity (04/17/2024)   Hunger Vital Sign    Worried About Running Out of Food in the Last Year: Never true    Ran Out of Food in the Last Year: Never true  Transportation Needs: No Transportation Needs (04/17/2024)   PRAPARE - Administrator, Civil Service (Medical): No    Lack of Transportation (Non-Medical): No  Physical Activity: Insufficiently Active (05/16/2023)   Exercise Vital Sign    Days of Exercise per Week: 2 days    Minutes of Exercise per Session: 30 min  Stress: No Stress Concern Present (05/16/2023)   [employer] of Occupational Health - Occupational Stress Questionnaire    Feeling of Stress : Only a little  Social Connections: Moderately Isolated (04/17/2024)   Social Connection and Isolation Panel    Frequency of Communication with Friends and Family: More than three times a week    Frequency of Social Gatherings with Friends and Family: Once a week    Attends Religious Services: More than 4 times per year    Active Member of [employer] or Organizations: No    Attends Banker Meetings: Never    Marital Status: Widowed  Intimate Partner Violence: Not At Risk (04/17/2024)   Humiliation, Afraid, Rape, and Kick questionnaire    Fear of Current or Ex-Partner: No    Emotionally Abused: No    Physically Abused: No    Sexually Abused: No     Family History  Problem Relation Age of Onset   Cancer Mother        breast and colon cancer   Hypertension Mother    Clotting disorder Mother    Breast cancer Mother 61   Arthritis Mother    Stroke Mother    Diabetes Mother    Colon cancer Mother 73 - 51   Alcohol  abuse Father    Cancer Sister        breast cancer   Multiple sclerosis Sister    Cancer Sister        colon cancer   Cancer Brother        colon cancer   Prostate cancer Half-Brother    Stomach cancer Half-Brother    Breast cancer Half-Sister 68        recurrence at 22, reports positive genetic testing   Arthritis Half-Sister    Diabetes Half-Sister    Colon cancer Half-Sister 62   Allergies Other        grandson   Allergic rhinitis Neg Hx    Angioedema Neg Hx    Asthma Neg Hx    Atopy Neg Hx    Eczema Neg Hx    Immunodeficiency Neg Hx    Urticaria Neg Hx     Review of Systems  Constitutional: Negative.   HENT: Negative.    Respiratory: Negative.    Cardiovascular: Negative.   Musculoskeletal:        Toe wound  Skin: Negative.   Neurological: Negative.   Endo/Heme/Allergies:  Bruises/bleeds easily.  Psychiatric/Behavioral: Negative.        Physical Examination  Vitals:   04/17/24 1426 04/17/24 1530  BP:  (!) 157/89  Pulse:  (!) 57  Resp:  16  Temp: 97.7 F (36.5 C) 97.8 F (36.6 C)  SpO2:  100%   There is no height or weight on file to calculate BMI.  Physical  Exam HENT:     Head: Normocephalic.     Mouth/Throat:     Mouth: Mucous membranes are moist.  Cardiovascular:     Pulses:          Dorsalis pedis pulses are 1+ on the left side.     Comments: 1+ palpable right anterior tibial pulse Pulmonary:     Effort: Pulmonary effort is normal.  Musculoskeletal:     Right lower leg: No edema.     Left lower leg: No edema.  Skin:    General: Skin is warm.     Capillary Refill: Capillary refill takes less than 2 seconds.  Neurological:     Mental Status: She is alert.      CBC    Component Value Date/Time   WBC 6.0 04/17/2024 1152   RBC 4.48 04/17/2024 1152   HGB 12.3 04/17/2024 1152   HGB 12.2 02/28/2022 0824   HGB 13.4 11/21/2020 1026   HGB 12.9 02/01/2011 1106   HCT 36.3 04/17/2024 1152   HCT 41.1 11/21/2020 1026   HCT 38.8 02/01/2011 1106   PLT 238 04/17/2024 1152   PLT 253 02/28/2022 0824   PLT 276 11/21/2020 1026   MCV 81.0 04/17/2024 1152   MCV 79 11/21/2020 1026   MCV 80.2 02/01/2011 1106   MCH 27.5 04/17/2024 1152   MCHC 33.9 04/17/2024 1152   RDW 15.5 04/17/2024 1152   RDW  15.9 (H) 11/21/2020 1026   RDW 15.4 (H) 02/01/2011 1106   LYMPHSABS 1.4 04/17/2024 1152   LYMPHSABS 1.9 02/01/2011 1106   MONOABS 0.6 04/17/2024 1152   MONOABS 0.6 02/01/2011 1106   EOSABS 0.1 04/17/2024 1152   EOSABS 0.1 02/01/2011 1106   BASOSABS 0.0 04/17/2024 1152   BASOSABS 0.0 02/01/2011 1106    BMET    Component Value Date/Time   NA 144 04/17/2024 1152   NA 140 11/21/2020 1026   K 4.0 04/17/2024 1152   CL 108 04/17/2024 1152   CO2 24 04/17/2024 1152   GLUCOSE 94 04/17/2024 1152   BUN 11 04/17/2024 1152   BUN 16 11/21/2020 1026   CREATININE 0.88 04/17/2024 1152   CREATININE 1.03 (H) 02/28/2022 0824   CREATININE 1.01 (H) 12/30/2020 1635   CALCIUM  9.7 04/17/2024 1152   GFRNONAA >60 04/17/2024 1152   GFRNONAA 57 (L) 02/28/2022 0824   GFRNONAA 53 (L) 01/04/2020 1059   GFRAA 62 01/04/2020 1059    COAGS: Lab Results  Component Value Date   INR 1.8 (H) 12/19/2021   INR 1.4 (H) 01/12/2020   INR 1.17 12/28/2010     Non-Invasive Vascular Imaging:   ABI pending  ASSESSMENT/PLAN: This is a 78 y.o. female history of toe ulcer as above.  Will need to hold Xarelto .  ABIs ordered.  Plan for aortogram from left common femoral approach on Monday with Dr. Pearline.  Lavene Penagos C. Sheree, MD [facility] Office: [phone] Pager: [phone]

## 2024-04-17 NOTE — ED Triage Notes (Signed)
 Reports increasing pain in R toes x 3 weeks. Toes are discolored and weeping. Arrived with bandages. Denies fevers.   Type 2 Diabetic.

## 2024-04-17 NOTE — Progress Notes (Signed)
 Plan of Care Note for accepted transfer   Patient: [name] MRN: [id number]   DOA: 04/17/2024  Facility requesting transfer: [facility]  Requesting Provider: Dr. Ollis  Reason for transfer: diabetic foot infection  Facility course: 78 year old with past medical history significant for T2DM, asthma, atrial flutter on xarelto , hx of DCIS of left breast, grave's disease, GERD, hx of PE/DVT, HTN, HLD, neuropathy, OSA on cpap, pulmonary HTN, SSS/AV block who presented to ED with complaints of worsening wound to toe. She is followed by podiatry. She had her 3rd right toe blister about 3 weeks ago and it was drained in the office. She was put on antibiotics, but it has gotten worse. She was initially given clindamycin  then changed to doxycycline . She started to have worsening pain in her toe last night.    Vitals:  wnl, bp: 160/67 Pertinent labs: lactic acid: 2.7, BC obtained. No leukocytosis.  Right foot xray: no acute fracture or dislocation.  In ED: given 1L IVF bolus, meropenem and zyvox. [facility] asked to admit  Discussed with podiatry, Dr. Princella. Asked ED to order MRI foot. Put in diet orders/SSI. Repeat A1c, check inflammatory markers. ABI ordered. Verified with pharmacy she has tolerated rocephin . Change abx to rocephin , flagyl  and zyvox.   Plan of care: The patient is accepted for admission to Telemetry unit, at [facility]    Author: Isaiah Geralds, MD 04/17/2024  Check [email or web address] for on-call coverage.  Nursing staff, Please call [facility] Admits & Consults System-Wide number on Amion as soon as patient's arrival, so appropriate admitting provider can evaluate the pt.

## 2024-04-17 NOTE — ED Provider Notes (Signed)
 [name] EMERGENCY DEPARTMENT AT [facility] Provider Note   CSN: [id number] Arrival date & time: 04/17/24  9065     Patient presents with: Wound Infection   [name] is a 78 y.o. female.   HPI      78yo female with history of DM, breast cancer, DVT, PE, hypertension.   No injuries, was not having pain but had color change, blood blister to 3rd toe 3 weeks ago, drained in the office 3 weeks ago, blood fluid, put on antibiotic started with a c,  then put another abx not sure the name, having east infection  No fevers, no nausea or vomiting   Last night began to have more severe pain to third toe of right foot  Podiatrist Dr Magdalen was seeing her  Has not seen any purulence Toe more swollen   Past Medical History:  Diagnosis Date   Allergic rhinitis    Allergy    Arthritis    Asthma    Breast cancer (HCC)    Chronic headache    Diabetes mellitus    Ductal carcinoma in situ (DCIS) of left breast 02/23/2022   DVT (deep venous thrombosis) (HCC)    Dyspnea    Heart murmur    History of radiation therapy    Left Breast 05/02/22-05/30/22- Dr. Lynwood Nasuti   Hypertension    Penicillin allergy 01/19/2020   Pulmonary embolism (HCC)    Pulmonary hypertension (HCC)    Seizures (HCC)      Prior to Admission medications   Medication Sig Start Date End Date Taking? Authorizing Provider  ADVAIR  HFA 230-21 MCG/ACT inhaler Inhale 2 puffs into the lungs 2 (two) times daily. On empty lungs 03/17/24   Kozlow, Camellia PARAS, MD  albuterol  (PROVENTIL ) (2.5 MG/3ML) 0.083% nebulizer solution Take 3 mLs (2.5 mg total) by nebulization every 6 (six) hours as needed for wheezing or shortness of breath. 03/17/24   Kozlow, Camellia PARAS, MD  albuterol  (VENTOLIN  HFA) [phone] Base) MCG/ACT inhaler Inhale 2 puffs into the lungs every 6 (six) hours as needed for wheezing or shortness of breath (Take with Flovent  During Flare Ups.). 03/17/24   Kozlow, Camellia PARAS, MD  anastrozole  (ARIMIDEX ) 1 MG tablet  Take 1 tablet (1 mg total) by mouth daily. 04/07/24   Gudena, Vinay, MD  apixaban  (ELIQUIS ) 5 MG TABS tablet Take 1 tablet (5 mg total) by mouth 2 (two) times daily. 04/07/24   Croitoru, Mihai, MD  atorvastatin  (LIPITOR) 10 MG tablet TAKE 1 TABLET(10 MG) BY MOUTH DAILY 07/08/23   Bedsole, Amy E, MD  Blood Glucose Monitoring Suppl (ONETOUCH VERIO) w/Device KIT Use to check blood sugar up to 2 times a day 08/16/20   Bedsole, Amy E, MD  budesonide  (PULMICORT ) 0.5 MG/2ML nebulizer solution Take 2 mLs (0.5 mg total) by nebulization in the morning and at bedtime. 09/23/23   Kozlow, Camellia PARAS, MD  cyclobenzaprine  (FLEXERIL ) 10 MG tablet Take 0.5-1 tablets (5-10 mg total) by mouth at bedtime as needed for muscle spasms. 03/09/22   Bedsole, Amy E, MD  desloratadine  (CLARINEX ) 5 MG tablet TAKE 1 TABLET(5 MG) BY MOUTH DAILY 02/26/24   Kozlow, Camellia PARAS, MD  doxycycline  (VIBRA -TABS) 100 MG tablet Take 1 tablet (100 mg total) by mouth 2 (two) times daily. 04/09/24   Magdalen Prentice PARAS, DPM  Empagliflozin -linaGLIPtin  (GLYXAMBI ) 10-5 MG TABS Take 1 tablet by mouth daily. 04/01/24   Bedsole, Amy E, MD  EPINEPHrine  0.3 mg/0.3 mL IJ SOAJ injection Inject 0.3 mg  into the muscle as needed for anaphylaxis. USE AS DIRECTED FOR LIFE THREATENING ALLERGIC REACTIONS 03/17/24   Kozlow, Camellia PARAS, MD  estradiol  (ESTRACE ) 0.1 MG/GM vaginal cream Place 1 Applicatorful vaginally 2 (two) times a week. (As Needed)    [provider]  famotidine  (PEPCID ) 40 MG tablet Take 1 tablet (40 mg total) by mouth at bedtime. Take 1 (ONE) tablet by mouth every evening. 03/17/24   Kozlow, Camellia PARAS, MD  fluticasone  (FLOVENT  HFA) 220 MCG/ACT inhaler Inhale 2 puffs into the lungs every 6 (six) hours as needed. Use with Albuterol  during flare ups. 01/07/24   Kozlow, Camellia PARAS, MD  gabapentin  (NEURONTIN ) 300 MG capsule TAKE 1 CAPSULE BY MOUTH IN THE MORNING, 1 CAPSULE AT LUNCH AND 2 CAPSULES AT BEDTIME 01/20/24   Hyatt, Max T, DPM  glucose blood (ONETOUCH VERIO) test strip 1  strip 2 (two) times daily 01/29/22   [provider]  hydrOXYzine  (ATARAX ) 10 MG tablet Take 1 tablet (10 mg total) by mouth daily as needed for itching. 05/03/23   Bedsole, Amy E, MD  ipratropium (ATROVENT ) 0.06 % nasal spray Place 2 sprays into both nostrils 2 (two) times daily as needed for rhinitis. 05/28/23   Kozlow, Camellia PARAS, MD  irbesartan  (AVAPRO ) 150 MG tablet TAKE 1 TABLET(150 MG) BY MOUTH DAILY 01/09/24   Croitoru, Mihai, MD  Lactase (DAIRY-RELIEF PO) Take 1 capsule by mouth daily as needed (eating dairy).    [provider]  Lancets (ONETOUCH DELICA PLUS LANCET33G) MISC USE TO CHECK BLOOD SUGAR UP TO TWICE DAILY 01/29/22   Bedsole, Amy E, MD  Magnesium 500 MG TABS Take 500 mg by mouth 2 (two) times daily.    [provider]  Menthol , Topical Analgesic, (ABSORBINE PLUS JR EX) Apply 1 patch topically daily as needed (pain).    [provider]  Menthol -Methyl Salicylate (SALONPAS JET SPRAY EX) Apply 1 spray topically daily as needed (knee pain).    [provider]  methimazole  (TAPAZOLE ) 5 MG tablet Take 1 tablet (5 mg total) by mouth daily. 11/05/23   Trixie File, MD  montelukast  (SINGULAIR ) 10 MG tablet Take 1 tablet (10 mg total) by mouth at bedtime. For asthma control. 03/17/24   Kozlow, Camellia PARAS, MD  Mouthwashes (BIOTENE DRY MOUTH MT) Use as directed 1 Dose in the mouth or throat daily as needed (dry mouth).    [provider]  Nebulizer MISC 1 Device by Does not apply route as directed. 02/12/23   Kozlow, Camellia PARAS, MD  ondansetron  (ZOFRAN -ODT) 4 MG disintegrating tablet Take 1 tablet (4 mg total) by mouth every 8 (eight) hours as needed for nausea or vomiting. 02/20/23   Bedsole, Amy E, MD  pantoprazole  (PROTONIX ) 40 MG tablet Take 1 tablet (40 mg total) by mouth 2 (two) times daily. 03/17/24   Kozlow, Camellia PARAS, MD  Plecanatide (TRULANCE) 3 MG TABS Take 3 mg by mouth daily as needed (constipation).    [provider]  Polyethyl  Glycol-Propyl Glycol (SYSTANE OP) Place 1 drop into both eyes daily as needed (dry eyes).    [provider]  polyethylene glycol powder (GLYCOLAX /MIRALAX ) 17 GM/SCOOP powder Take 1 Container by mouth once.    [provider]  povidone-iodine  (BETADINE ) 10 % external solution Apply 1 Application topically as needed for wound care. 12/26/23   Gaynel Delon CROME, DPM  predniSONE  (DELTASONE ) 20 MG tablet 3 tabs by mouth daily x 3 days, then 2 tabs by mouth daily x 2 days  then 1 tab by mouth daily x 2 days 03/20/24   Avelina Greig BRAVO, MD  PRESCRIPTION MEDICATION Inhale into the lungs at bedtime. CPAP    [provider]  Respiratory Therapy Supplies (NEBULIZER MASK ADULT) MISC 1 kit by Does not apply route as directed. 01/15/23   Kozlow, Camellia PARAS, MD  rivaroxaban  (XARELTO ) 20 MG TABS tablet Take 1 tablet (20 mg total) by mouth daily with supper. 04/16/23   Croitoru, Mihai, MD  triamcinolone  (NASACORT ) 55 MCG/ACT AERO nasal inhaler Place 2 sprays into the nose daily. 05/28/23   Kozlow, Camellia PARAS, MD  Vibegron  (GEMTESA ) 75 MG TABS 1 tablet daily 12/02/23   Bedsole, Amy E, MD  XIFAXAN 550 MG TABS tablet Take 550 mg by mouth 3 (three) times daily. 03/09/24   [provider]    Allergies: Dilantin [phenytoin], Latex, Oxycodone , Penicillins, Codeine , Hydrocodone , Nickel, and Pregabalin    Review of Systems  Updated Vital Signs BP (!) 153/90   Pulse 73   Temp 98.2 F (36.8 C) (Oral)   Resp 17   SpO2 100%   Physical Exam Vitals and nursing note reviewed.  Constitutional:      General: She is not in acute distress.    Appearance: She is well-developed. She is not diaphoretic.  HENT:     Head: Normocephalic and atraumatic.  Eyes:     Conjunctiva/sclera: Conjunctivae normal.  Cardiovascular:     Rate and Rhythm: Normal rate and regular rhythm.     Pulses: Normal pulses.  Pulmonary:     Effort: Pulmonary effort is normal. No respiratory distress.     Breath sounds: Normal  breath sounds.  Musculoskeletal:        General: Swelling and tenderness (right 3rd toe and extension along extensor tendons and to 5th MTP) present.     Cervical back: Normal range of motion.  Skin:    General: Skin is warm and dry.     Findings: No rash.     Comments: See picture below with discoloration to right 3rd toe  Neurological:     Mental Status: She is alert and oriented to person, place, and time.        (all labs ordered are listed, but only abnormal results are displayed) Labs Reviewed  LACTIC ACID, PLASMA - Abnormal; Notable for the following components:      Result Value   Lactic Acid, Venous 2.7 (*)    All other components within normal limits  PREALBUMIN - Abnormal; Notable for the following components:   Prealbumin 14 (*)    All other components within normal limits  CBC - Abnormal; Notable for the following components:   Hemoglobin 11.9 (*)    HCT 35.8 (*)    All other components within normal limits  GLUCOSE, CAPILLARY - Abnormal; Notable for the following components:   Glucose-Capillary 106 (*)    All other components within normal limits  CULTURE, BLOOD (ROUTINE X 2)  CULTURE, BLOOD (ROUTINE X 2)  CBC WITH DIFFERENTIAL/PLATELET  COMPREHENSIVE METABOLIC PANEL WITH GFR  LACTIC ACID, PLASMA  HEMOGLOBIN A1C  SEDIMENTATION RATE  C-REACTIVE PROTEIN  GLUCOSE, CAPILLARY  CREATININE, SERUM  COMPREHENSIVE METABOLIC PANEL WITH GFR  CBC  PROTIME-INR  APTT  SEDIMENTATION RATE  C-REACTIVE PROTEIN    EKG: None  Radiology: DG Foot Complete Right Result Date: 04/17/2024 CLINICAL DATA:  pain EXAM: RIGHT FOOT COMPLETE - 3+ VIEW COMPARISON:  04/09/2024 FINDINGS: Similar changes of an internal fixation of the fifth metatarsal neck.No acute  fracture or dislocation. Bony ankylosis of the interphalangeal joints of all 5 digits. Degenerative spurring of the midfoot. Soft tissues are unremarkable. No radiopaque foreign body. IMPRESSION: No acute fracture or  dislocation. Electronically Signed   By: Rogelia Myers M.D.   On: 04/17/2024 13:15     Procedures   Medications Ordered in the ED  insulin  aspart (novoLOG ) injection 0-9 Units (0 Units Subcutaneous Not Given 04/17/24 1647)  cefTRIAXone  (ROCEPHIN ) 2 g in sodium chloride  0.9 % 100 mL IVPB (0 g Intravenous Stopped 04/17/24 2210)    And  metroNIDAZOLE  (FLAGYL ) IVPB 500 mg (500 mg Intravenous New Bag/Given 04/17/24 2211)    And  linezolid (ZYVOX) IVPB 600 mg (has no administration in time range)  fluticasone  furoate-vilanterol (BREO ELLIPTA) 200-25 MCG/ACT 1 puff (1 puff Inhalation Not Given 04/17/24 1945)  albuterol  (PROVENTIL ) (2.5 MG/3ML) 0.083% nebulizer solution 2.5 mg (has no administration in time range)  anastrozole  (ARIMIDEX ) tablet 1 mg (has no administration in time range)  atorvastatin  (LIPITOR) tablet 10 mg (10 mg Oral Given 04/17/24 1836)  budesonide  (PULMICORT ) nebulizer solution 0.5 mg (0.5 mg Nebulization Patient Refused/Not Given 04/17/24 1831)  irbesartan  (AVAPRO ) tablet 150 mg (150 mg Oral Given 04/17/24 1836)  methimazole  (TAPAZOLE ) tablet 5 mg (5 mg Oral Given 04/17/24 2117)  montelukast  (SINGULAIR ) tablet 10 mg (10 mg Oral Given 04/17/24 2117)  pantoprazole  (PROTONIX ) EC tablet 40 mg (40 mg Oral Given 04/17/24 2118)  enoxaparin (LOVENOX) injection 40 mg (40 mg Subcutaneous Given 04/17/24 1838)  gabapentin  (NEURONTIN ) capsule 300 mg (has no administration in time range)  gabapentin  (NEURONTIN ) capsule 600 mg (600 mg Oral Given 04/17/24 2117)  meropenem (MERREM) 1 g in sodium chloride  0.9 % 100 mL IVPB (0 g Intravenous Stopped 04/17/24 1320)    And  linezolid (ZYVOX) IVPB 600 mg (0 mg Intravenous Stopped 04/17/24 1426)  lactated ringers  bolus 1,000 mL (0 mLs Intravenous Stopped 04/17/24 1438)  gadobutrol (GADAVIST) 1 MMOL/ML injection 8 mL (8 mLs Intravenous Contrast Given 04/17/24 1808)                                     78yo female with history of DM, breast cancer, DVT, PE,  hypertension who presents with concern for toe pain worsening last night after she had initially presented with friction blister to the area.    Labs completed by me show no leukocytosis, no electrolyte abnormalities.  Lactic acid is mildly elevated.   Has normal pulses of foot, low suspicion for acute arterial occlusion.    No signs of sepsis.   Given pain new and worsening despite being on antibiotics, now with tendenress around area and extending through food, suspect wound infection. Has fluctuance of toe, suspect abscess. Discussed with Dr. Pierce DPM with [facility]-will see tomorrow, recommend MRI, vascular studies.   Given penicillin allergy and following ED moderate DM foot wound abx recommendations recommends linezolid and meropenum.   Admitted for further care.     Final diagnoses:  Diabetic foot infection [facility])    ED Discharge Orders     None          Dreama Longs, MD 04/17/24 2224

## 2024-04-18 ENCOUNTER — Inpatient Hospital Stay (HOSPITAL_COMMUNITY)

## 2024-04-18 ENCOUNTER — Encounter (HOSPITAL_COMMUNITY)

## 2024-04-18 DIAGNOSIS — I7090 Unspecified atherosclerosis: Secondary | ICD-10-CM

## 2024-04-18 DIAGNOSIS — E11628 Type 2 diabetes mellitus with other skin complications: Secondary | ICD-10-CM

## 2024-04-18 DIAGNOSIS — M86171 Other acute osteomyelitis, right ankle and foot: Secondary | ICD-10-CM

## 2024-04-18 DIAGNOSIS — L089 Local infection of the skin and subcutaneous tissue, unspecified: Secondary | ICD-10-CM

## 2024-04-18 LAB — PROTIME-INR
INR: 1.7 — ABNORMAL HIGH (ref 0.8–1.2)
Prothrombin Time: 20.7 s — ABNORMAL HIGH (ref 11.4–15.2)

## 2024-04-18 LAB — COMPREHENSIVE METABOLIC PANEL WITH GFR
ALT: 10 U/L (ref 0–44)
AST: 21 U/L (ref 15–41)
Albumin: 3.1 g/dL — ABNORMAL LOW (ref 3.5–5.0)
Alkaline Phosphatase: 80 U/L (ref 38–126)
Anion gap: 9 (ref 5–15)
BUN: 8 mg/dL (ref 8–23)
CO2: 26 mmol/L (ref 22–32)
Calcium: 9.2 mg/dL (ref 8.9–10.3)
Chloride: 108 mmol/L (ref 98–111)
Creatinine, Ser: 0.94 mg/dL (ref 0.44–1.00)
GFR, Estimated: >60 mL/min (ref >60)
Glucose, Bld: 117 mg/dL — ABNORMAL HIGH (ref 70–99)
Potassium: 4.3 mmol/L (ref 3.5–5.1)
Sodium: 143 mmol/L (ref 135–145)
Total Bilirubin: 0.6 mg/dL (ref 0.0–1.2)
Total Protein: 5.8 g/dL — ABNORMAL LOW (ref 6.5–8.1)

## 2024-04-18 LAB — CBC
HCT: 35.8 % — ABNORMAL LOW (ref 36.0–46.0)
Hemoglobin: 11.9 g/dL — ABNORMAL LOW (ref 12.0–15.0)
MCH: 26.9 pg (ref 26.0–34.0)
MCHC: 33.2 g/dL (ref 30.0–36.0)
MCV: 80.8 fL (ref 80.0–100.0)
Platelets: 242 K/uL (ref 150–400)
RBC: 4.43 MIL/uL (ref 3.87–5.11)
RDW: 15.4 % (ref 11.5–15.5)
WBC: 6.5 K/uL (ref 4.0–10.5)
nRBC: 0 % (ref 0.0–0.2)

## 2024-04-18 LAB — GLUCOSE, CAPILLARY
Glucose-Capillary: 105 mg/dL — ABNORMAL HIGH (ref 70–99)
Glucose-Capillary: 126 mg/dL — ABNORMAL HIGH (ref 70–99)
Glucose-Capillary: 111 mg/dL — ABNORMAL HIGH (ref 70–99)
Glucose-Capillary: 117 mg/dL — ABNORMAL HIGH (ref 70–99)

## 2024-04-18 LAB — APTT: aPTT: 53 s — ABNORMAL HIGH (ref 24–36)

## 2024-04-18 LAB — SEDIMENTATION RATE: Sed Rate: 11 mm/h (ref 0–22)

## 2024-04-18 LAB — C-REACTIVE PROTEIN: CRP: <0.5 mg/dL (ref <1.0)

## 2024-04-18 LAB — VITAMIN D 25 HYDROXY (VIT D DEFICIENCY, FRACTURES): Vit D, 25-Hydroxy: 35.31 ng/mL (ref 30–100)

## 2024-04-18 NOTE — Care Management (Signed)
 Transition of Care [facility]) - Inpatient Brief Assessment   Patient Details  Name: [name] MRN: [id number] Date of Birth: [birth year]  Transition of Care [facility]) CM/SW Contact:    Corean JAYSON Canary, RN Phone Number: 04/18/2024, 3:22 PM   Clinical Narrative: 78 year old patient presented with third toe swelling and drainage. SABRA History of aflutter on xaralto a aortogram is planned for Monday. She is receiving IV antibiotics.   No needs at this time will watch for DME and home health needs.    Transition of Care Asessment: Insurance and Status: Insurance coverage has been reviewed Patient has primary care physician: Yes   Prior level of function:: Independent Prior/Current Home Services: No current home services Social Drivers of Health Review: SDOH reviewed no interventions necessary Readmission risk has been reviewed: Yes Transition of care needs: no transition of care needs at this time

## 2024-04-18 NOTE — Plan of Care (Signed)
  Problem: Metabolic: Goal: Ability to maintain appropriate glucose levels will improve Outcome: Progressing   Problem: Nutritional: Goal: Maintenance of adequate nutrition will improve Outcome: Progressing   Problem: Skin Integrity: Goal: Risk for impaired skin integrity will decrease Outcome: Progressing   

## 2024-04-18 NOTE — Consult Note (Signed)
  Subjective:  Patient ID: [name], female    DOB: Feb 03, 1946,  MRN: [id number]  A 78 y.o. female  medical history significant of type 2 diabetes, atrial flutter on Xarelto , DCIS of left breast, Graves' disease, GERD, PE and DVT, hypertension, hyperlipidemia, obesity, obstructive sleep apnea on CPAP, neuropathy, pulmonary hypertension, sick sinus syndrome admitted with complaints of worsening wound increasing pain to the right third digit.  She states been going on for past few weeks.  She is known to Dr. Magdalen who is managing it as an outpatient.  She states that it has progressively gotten worse denies any other acute complaint no nausea fever chills vomiting.  Patient had an MRI done prior to being transferred Objective:   Vitals:   04/18/24 0418 04/18/24 1027  BP: (!) 153/76 125/72  Pulse: 70 71  Resp: 16 18  Temp: 98.2 F (36.8 C) 98 F (36.7 C)  SpO2: 100% 100%   General AA&O x3. Normal mood and affect.  Vascular Dorsalis pedis and posterior tibial pulses nonpalpable bilateral Diminished capillary refill to all digits.  No pedal hair present.  Neurologic Epicritic sensation grossly intact.  Dermatologic Right third digit ulceration slightly improving.  No purulent drainage noted no malodor present.  Superficial wound noted.  Orthopedic: MMT 5/5 in dorsiflexion, plantarflexion, inversion, and eversion. Normal joint ROM without pain or crepitus.   MPRESSION: Osteomyelitis of the third toe centered at the interphalangeal joint. There is destruction of the distal aspect of the third proximal phalanx.   Soft tissue swelling and wound. No drainable collection.  Assessment & Plan:  Patient was evaluated and treated and all questions answered.  Right third digit ulceration with underlying osteomyelitis - All questions or concerns were discussed with the patient in extensive detail - Patient is scheduled to undergo angiogram on Monday.  Will continue to follow. - Patient will  likely need third digit amputation given the finding of osteomyelitis.  Will await for vascular to be stabilized from their standpoint. - Continue IV antibiotics - Betadine  wet-to-dry dressing - Weightbearing as tolerated in surgical shoe - Podiatry to continue follow  Franky SHAUNNA Blanch, DPM  Accessible via secure chat for questions or concerns.

## 2024-04-18 NOTE — Progress Notes (Signed)
 ABI's have been completed. Preliminary results can be found in CV Proc through chart review.   04/18/24 10:38 AM Cathlyn Collet RVT

## 2024-04-18 NOTE — Progress Notes (Signed)
   04/18/24 2336  BiPAP/CPAP/SIPAP  $ Non-Invasive Home Ventilator  Initial  $ Face Mask Medium Yes  BiPAP/CPAP/SIPAP Pt Type Adult  BiPAP/CPAP/SIPAP Resmed  Mask Type Nasal mask  Dentures removed? Not applicable  Mask Size Medium  Respiratory Rate 18 breaths/min  PEEP 5 cmH20  FiO2 (%) 21 %  Patient Home Machine No  Patient Home Mask No  Patient Home Tubing No  Auto Titrate No  Device Plugged into [employer] Power Outlet Yes

## 2024-04-18 NOTE — Progress Notes (Signed)
 PROGRESS NOTE    [name]  FMW:[id number] DOB: [birth year] DOA: 04/17/2024 PCP: Avelina Greig BRAVO, MD  Brief Narrative:78 y.o. female with medical history significant of type 2 diabetes, atrial flutter on Xarelto , DCIS of left breast, Graves' disease, GERD, PE and DVT, hypertension, hyperlipidemia, obesity, obstructive sleep apnea on CPAP, neuropathy, pulmonary hypertension, sick sinus syndrome admitted with complaints of worsening wound increasing pain to the right second toe.  She noticed a blister on her right third toe 3 weeks ago and she was seen by podiatry was placed on antibiotics without any improvement.  She was given clindamycin  and then changed to doxycycline .  In the ED she had a right foot x-ray with no fracture or dislocation.  She was given meropenem and Zyvox and a fluid bolus.  ED physician discussed with Dr. Sandefur with podiatry.  He recommended MRI of the foot right foot.  And admitted for IV antibiotics.  Denied fever or chills nausea vomiting diarrhea abdominal pain urinary complaints chest pain shortness of breath or cough.  Patient has been seen by vascular who recommended to hold Xarelto  and planning for aortogram on Monday, ABIs ordered  Assessment & Plan:   Principal Problem:   Diabetic foot infection (HCC) Active Problems:   Hypertension associated with diabetes (HCC)   Neuropathy due to secondary diabetes (HCC)   Moderate persistent asthma   Obstructive sleep apnea treated with continuous positive airway pressure (CPAP)   Hyperlipidemia associated with type 2 diabetes mellitus (HCC)   Type 2 diabetes mellitus with diabetic neuropathy, unspecified (HCC)   Hyperthyroidism   Diabetic foot (HCC)    #1 diabetic foot with infected right third toe-patient admitted  patient admitted with right third toe swelling drainage and pain for the last  3 weeks.  It started with will blister 3 weeks ago.  Failed outpatient management with clindamycin  and doxycycline .  Her pain  started to get worse with increased drainage and swelling.  She decided to come to the ED for this.  She has no leukocytosis, sed rate and CRP are normal.  MRI, ABI pending, MRI done official dictation pending On Rocephin  Flagyl  and Zyvox Seen by vascular recommending to hold Xarelto  and planning for aortogram on Monday with Dr. Pearline.  N.p.o. after midnight on Sunday. Podiatry to see patient today.    #2 type 2 diabetes controlled hemoglobin A1c this admission is 5.8 Will place her on sliding scale insulin  hold SGLT2 and GLP-1 CBG (last 3)  Recent Labs    04/17/24 1635 04/17/24 2147 04/18/24 0621  GLUCAP 86 106* 105*      #3 history of Graves' disease continue methimazole  TSH 4.23 on July 2025.   #4 obstructive sleep apnea on CPAP continue   #5 essential hypertension continue Avapro    #6 asthma with rhinitis continue Singulair  breathing nebs Breo Ellipta.   #7 neuropathy secondary to type 2 diabetes continue Neurontin .    Estimated body mass index is 33.91 kg/m as calculated from the following:   Height as of 03/20/24: 5' 3.5 (1.613 m).   Weight as of 03/20/24: 88.2 kg.  DVT prophylaxis: lovenox Code Status: full Family Communication: none Disposition Plan:  Status is: Inpatient Remains inpatient appropriate because: acute illness   Consultants: vascular podiatry  Procedures: none Antimicrobials: Anti-infectives (From admission, onward)    Start     Dose/Rate Route Frequency Ordered Stop   04/18/24 0200  linezolid (ZYVOX) IVPB 600 mg       Placed in And Linked Group  600 mg 300 mL/hr over 60 Minutes Intravenous Every 12 hours 04/17/24 1406 04/24/24 0959   04/17/24 2000  cefTRIAXone  (ROCEPHIN ) 2 g in sodium chloride  0.9 % 100 mL IVPB  Status:  Discontinued       Placed in And Linked Group   2 g 200 mL/hr over 30 Minutes Intravenous Every 24 hours 04/17/24 1358 04/17/24 1406   04/17/24 2000  metroNIDAZOLE  (FLAGYL ) IVPB 500 mg  Status:  Discontinued        Placed in And Linked Group   500 mg 100 mL/hr over 60 Minutes Intravenous Every 8 hours 04/17/24 1358 04/17/24 1406   04/17/24 2000  cefTRIAXone  (ROCEPHIN ) 2 g in sodium chloride  0.9 % 100 mL IVPB       Placed in And Linked Group   2 g 200 mL/hr over 30 Minutes Intravenous Every 24 hours 04/17/24 1406 04/24/24 1959   04/17/24 1430  metroNIDAZOLE  (FLAGYL ) IVPB 500 mg       Placed in And Linked Group   500 mg 100 mL/hr over 60 Minutes Intravenous Every 8 hours 04/17/24 1406 04/24/24 1429   04/17/24 1400  linezolid (ZYVOX) IVPB 600 mg  Status:  Discontinued       Placed in And Linked Group   600 mg 300 mL/hr over 60 Minutes Intravenous Every 12 hours 04/17/24 1358 04/17/24 1406   04/17/24 1215  meropenem (MERREM) 1 g in sodium chloride  0.9 % 100 mL IVPB       Placed in And Linked Group   1 g 200 mL/hr over 30 Minutes Intravenous  Once 04/17/24 1208 04/17/24 1320   04/17/24 1215  linezolid (ZYVOX) IVPB 600 mg       Placed in And Linked Group   600 mg 300 mL/hr over 60 Minutes Intravenous  Once 04/17/24 1208 04/17/24 1426       Subjective: Had MRI last night not dictated yet denies any new complaints or pain  Objective: Vitals:   04/17/24 1426 04/17/24 1530 04/17/24 1935 04/18/24 0418  BP:  (!) 157/89 (!) 153/90 (!) 153/76  Pulse:  (!) 57 73 70  Resp:  16 17 16   Temp: 97.7 F (36.5 C) 97.8 F (36.6 C) 98.2 F (36.8 C) 98.2 F (36.8 C)  TempSrc: Oral  Oral Oral  SpO2:  100% 100% 100%    Intake/Output Summary (Last 24 hours) at 04/18/2024 0930 Last data filed at 04/17/2024 1600 Gross per 24 hour  Intake 1500.78 ml  Output --  Net 1500.78 ml   There were no vitals filed for this visit.  Examination:  General exam: Appears calm and comfortable  Respiratory system: Clear to auscultation. Respiratory effort normal. Cardiovascular system: S1 & S2 heard, RRR. No JVD, murmurs, rubs, gallops or clicks. No pedal edema. Gastrointestinal system: Abdomen is  nondistended, soft and nontender. No organomegaly or masses felt. Normal bowel sounds heard. Central nervous system: Alert and oriented. No focal neurological deficits. Extremities: Symmetric 5 x 5 power.  Right third toe swollen discolored see media section  Data Reviewed: I have personally reviewed following labs and imaging studies  CBC: Recent Labs  Lab 04/17/24 1152 04/17/24 2029  WBC 6.0 6.5  NEUTROABS 3.8  --   HGB 12.3 11.9*  HCT 36.3 35.8*  MCV 81.0 80.8  PLT 238 246   Basic Metabolic Panel: Recent Labs  Lab 04/17/24 1152 04/17/24 2029  NA 144  --   K 4.0  --   CL 108  --   CO2 24  --  GLUCOSE 94  --   BUN 11  --   CREATININE 0.88 0.89  CALCIUM  9.7  --    GFR: CrCl cannot be calculated (Unknown ideal weight.). Liver Function Tests: Recent Labs  Lab 04/17/24 1152  AST 20  ALT 7  ALKPHOS 107  BILITOT 0.8  PROT 6.5  ALBUMIN  4.0   No results for input(s): LIPASE, AMYLASE in the last 168 hours. No results for input(s): AMMONIA in the last 168 hours. Coagulation Profile: No results for input(s): INR, PROTIME in the last 168 hours. Cardiac Enzymes: No results for input(s): CKTOTAL, CKMB, CKMBINDEX, TROPONINI in the last 168 hours. BNP (last 3 results) No results for input(s): PROBNP in the last 8760 hours. HbA1C: Recent Labs    04/17/24 1430  HGBA1C 5.6   CBG: Recent Labs  Lab 04/17/24 1635 04/17/24 2147 04/18/24 0621  GLUCAP 86 106* 105*   Lipid Profile: No results for input(s): CHOL, HDL, LDLCALC, TRIG, CHOLHDL, LDLDIRECT in the last 72 hours. Thyroid  Function Tests: No results for input(s): TSH, T4TOTAL, FREET4, T3FREE, THYROIDAB in the last 72 hours. Anemia Panel: No results for input(s): VITAMINB12, FOLATE, FERRITIN, TIBC, IRON, RETICCTPCT in the last 72 hours. Sepsis Labs: Recent Labs  Lab 04/17/24 1215  LATICACIDVEN 1.4  2.7*    Recent Results (from the past 240 hours)   Blood culture (routine x 2)     Status: None (Preliminary result)   Collection Time: 04/17/24 12:04 PM   Specimen: BLOOD RIGHT ARM  Result Value Ref Range Status   Specimen Description BLOOD RIGHT ARM  Final   Special Requests   Final    BOTTLES DRAWN AEROBIC AND ANAEROBIC Blood Culture adequate volume   Culture   Final    NO GROWTH < 24 HOURS Performed at [facility], [address] [place], [place] [address]    Report Status PENDING  Incomplete  Blood culture (routine x 2)     Status: None (Preliminary result)   Collection Time: 04/17/24 12:09 PM   Specimen: BLOOD LEFT HAND  Result Value Ref Range Status   Specimen Description BLOOD LEFT HAND  Final   Special Requests   Final    BOTTLES DRAWN AEROBIC AND ANAEROBIC Blood Culture adequate volume   Culture   Final    NO GROWTH < 24 HOURS Performed at [facility], [address] [place], [place] [address]    Report Status PENDING  Incomplete         Radiology Studies: DG Foot Complete Right Result Date: 04/17/2024 CLINICAL DATA:  pain EXAM: RIGHT FOOT COMPLETE - 3+ VIEW COMPARISON:  04/09/2024 FINDINGS: Similar changes of an internal fixation of the fifth metatarsal neck.No acute fracture or dislocation. Bony ankylosis of the interphalangeal joints of all 5 digits. Degenerative spurring of the midfoot. Soft tissues are unremarkable. No radiopaque foreign body. IMPRESSION: No acute fracture or dislocation. Electronically Signed   By: Rogelia Myers M.D.   On: 04/17/2024 13:15   Scheduled Meds:  anastrozole   1 mg Oral Daily   atorvastatin   10 mg Oral Daily   budesonide   0.5 mg Nebulization Daily   enoxaparin (LOVENOX) injection  40 mg Subcutaneous Q24H   fluticasone  furoate-vilanterol  1 puff Inhalation Daily   gabapentin   300 mg Oral BID   gabapentin   600 mg Oral QHS   insulin  aspart  0-9 Units Subcutaneous TID WC   irbesartan   150 mg Oral Daily   methimazole   5 mg Oral Daily  montelukast   10 mg Oral QHS    pantoprazole   40 mg Oral BID   Continuous Infusions:  cefTRIAXone  (ROCEPHIN )  IV Stopped (04/17/24 2210)   And   metronidazole  500 mg (04/18/24 0616)   And   linezolid (ZYVOX) IV 600 mg (04/18/24 0315)     LOS: 1 day   Almarie KANDICE Hoots, MD 04/18/2024, 9:30 AM

## 2024-04-18 NOTE — Plan of Care (Signed)
  Problem: Education: Goal: Ability to describe self-care measures that may prevent or decrease complications (Diabetes Survival Skills Education) will improve Outcome: Progressing   Problem: Skin Integrity: Goal: Risk for impaired skin integrity will decrease Outcome: Progressing   Problem: Activity: Goal: Risk for activity intolerance will decrease Outcome: Progressing   Problem: Pain Managment: Goal: General experience of comfort will improve and/or be controlled Outcome: Progressing   Problem: Safety: Goal: Ability to remain free from injury will improve Outcome: Progressing   Problem: Skin Integrity: Goal: Risk for impaired skin integrity will decrease Outcome: Progressing

## 2024-04-19 DIAGNOSIS — S91104A Unspecified open wound of right lesser toe(s) without damage to nail, initial encounter: Secondary | ICD-10-CM | POA: Diagnosis not present

## 2024-04-19 DIAGNOSIS — E11628 Type 2 diabetes mellitus with other skin complications: Secondary | ICD-10-CM

## 2024-04-19 DIAGNOSIS — L089 Local infection of the skin and subcutaneous tissue, unspecified: Secondary | ICD-10-CM

## 2024-04-19 LAB — GLUCOSE, CAPILLARY
Glucose-Capillary: 105 mg/dL — ABNORMAL HIGH (ref 70–99)
Glucose-Capillary: 107 mg/dL — ABNORMAL HIGH (ref 70–99)
Glucose-Capillary: 91 mg/dL (ref 70–99)
Glucose-Capillary: 117 mg/dL — ABNORMAL HIGH (ref 70–99)

## 2024-04-19 LAB — SEDIMENTATION RATE: Sed Rate: 15 mm/h (ref 0–22)

## 2024-04-19 LAB — C-REACTIVE PROTEIN: CRP: <0.5 mg/dL (ref <1.0)

## 2024-04-19 MED ORDER — ACETAMINOPHEN 500 MG PO TABS
1000.0000 mg | ORAL_TABLET | Freq: Two times a day (BID) | ORAL | Status: DC
  Administered 2024-04-19 – 2024-04-23 (×7): 1000 mg via ORAL
  Filled 2024-04-19 (×8): qty 2

## 2024-04-19 NOTE — Progress Notes (Signed)
 PROGRESS NOTE    [name]  FMW:[id number] DOB: 02-Aug-1945 DOA: 04/17/2024 PCP: Avelina Greig BRAVO, MD  Brief Narrative:78 y.o. female with medical history significant of type 2 diabetes, atrial flutter on Xarelto , DCIS of left breast, Graves' disease, GERD, PE and DVT, hypertension, hyperlipidemia, obesity, obstructive sleep apnea on CPAP, neuropathy, pulmonary hypertension, sick sinus syndrome admitted with complaints of worsening wound increasing pain to the right second toe.  She noticed a blister on her right third toe 3 weeks ago and she was seen by podiatry was placed on antibiotics without any improvement.  She was given clindamycin  and then changed to doxycycline .  In the ED she had a right foot x-ray with no fracture or dislocation.  She was given meropenem and Zyvox and a fluid bolus.  ED physician discussed with Dr. Sandefur with podiatry.  He recommended MRI of the foot right foot.  And admitted for IV antibiotics.  Denied fever or chills nausea vomiting diarrhea abdominal pain urinary complaints chest pain shortness of breath or cough.  Patient has been seen by vascular who recommended to hold Xarelto  and planning for aortogram on Monday, ABIs ordered  Assessment & Plan:   Principal Problem:   Diabetic foot infection (HCC) Active Problems:   Hypertension associated with diabetes (HCC)   Neuropathy due to secondary diabetes (HCC)   Moderate persistent asthma   Obstructive sleep apnea treated with continuous positive airway pressure (CPAP)   Hyperlipidemia associated with type 2 diabetes mellitus (HCC)   Type 2 diabetes mellitus with diabetic neuropathy, unspecified (HCC)   Hyperthyroidism   Diabetic foot (HCC)   Acute osteomyelitis of toe of right foot (HCC)    #1 Diabetic foot with infected right third toe-patient admitted  patient admitted with right third toe swelling drainage and pain for the last  3 weeks.  It started with will blister 3 weeks ago.  Failed outpatient  management with clindamycin  and doxycycline .  Her pain started to get worse with increased drainage and swelling.  She decided to come to the ED for this.  She has no leukocytosis, sed rate and CRP are normal. ABI resting right ABI index is within normal range, the right toe-brachial index is abnormal.  Left side is normal.  MRI confirms right third toe osteomyelitis.  Patient followed by vascular and podiatry.  Plan for angiogram tomorrow. Continue Rocephin  Flagyl  and Zyvox.  Xarelto  on hold for possible surgery.  N.p.o. after midnight tonight Sunday.   #2 type 2 diabetes controlled hemoglobin A1c this admission is 5.8 Will place her on sliding scale insulin  hold SGLT2 and GLP-1 CBG (last 3)  Recent Labs    04/18/24 2210 04/19/24 0624 04/19/24 1137  GLUCAP 117* 105* 107*     #3 history of Graves' disease continue methimazole  TSH 4.23 on July 2025.   #4 obstructive sleep apnea on CPAP continue   #5 essential hypertension continue Avapro    #6 asthma with rhinitis continue Singulair  breathing nebs Breo Ellipta.   #7 neuropathy secondary to type 2 diabetes continue Neurontin .    Estimated body mass index is 33.91 kg/m as calculated from the following:   Height as of 03/20/24: 5' 3.5 (1.613 m).   Weight as of 03/20/24: 88.2 kg.  DVT prophylaxis: lovenox Code Status: full Family Communication: none Disposition Plan:  Status is: Inpatient Remains inpatient appropriate because: acute illness   Consultants: vascular podiatry  Procedures: none Antimicrobials: Anti-infectives (From admission, onward)    Start     Dose/Rate Route Frequency  Ordered Stop   04/18/24 0200  linezolid (ZYVOX) IVPB 600 mg       Placed in And Linked Group   600 mg 300 mL/hr over 60 Minutes Intravenous Every 12 hours 04/17/24 1406 04/24/24 0959   04/17/24 2000  cefTRIAXone  (ROCEPHIN ) 2 g in sodium chloride  0.9 % 100 mL IVPB  Status:  Discontinued       Placed in And Linked Group   2 g 200 mL/hr over  30 Minutes Intravenous Every 24 hours 04/17/24 1358 04/17/24 1406   04/17/24 2000  metroNIDAZOLE  (FLAGYL ) IVPB 500 mg  Status:  Discontinued       Placed in And Linked Group   500 mg 100 mL/hr over 60 Minutes Intravenous Every 8 hours 04/17/24 1358 04/17/24 1406   04/17/24 2000  cefTRIAXone  (ROCEPHIN ) 2 g in sodium chloride  0.9 % 100 mL IVPB       Placed in And Linked Group   2 g 200 mL/hr over 30 Minutes Intravenous Every 24 hours 04/17/24 1406 04/24/24 1959   04/17/24 1430  metroNIDAZOLE  (FLAGYL ) IVPB 500 mg       Placed in And Linked Group   500 mg 100 mL/hr over 60 Minutes Intravenous Every 8 hours 04/17/24 1406 04/24/24 1429   04/17/24 1400  linezolid (ZYVOX) IVPB 600 mg  Status:  Discontinued       Placed in And Linked Group   600 mg 300 mL/hr over 60 Minutes Intravenous Every 12 hours 04/17/24 1358 04/17/24 1406   04/17/24 1215  meropenem (MERREM) 1 g in sodium chloride  0.9 % 100 mL IVPB       Placed in And Linked Group   1 g 200 mL/hr over 30 Minutes Intravenous  Once 04/17/24 1208 04/17/24 1320   04/17/24 1215  linezolid (ZYVOX) IVPB 600 mg       Placed in And Linked Group   600 mg 300 mL/hr over 60 Minutes Intravenous  Once 04/17/24 1208 04/17/24 1426       Subjective: Resting in bed requesting Tylenol  for pain no new complaints chart reviewed Objective: Vitals:   04/18/24 2027 04/19/24 0529 04/19/24 0818 04/19/24 0915  BP: (!) 158/89 (!) 144/65 135/71   Pulse: 74 61 73   Resp: 17 18 18    Temp: 98.3 F (36.8 C) (!) 97.5 F (36.4 C) 98.2 F (36.8 C)   TempSrc: Oral Oral Oral   SpO2: 95% 99% 99% 98%   No intake or output data in the 24 hours ending 04/19/24 1306  There were no vitals filed for this visit.  Examination:  General exam: Appears  in nad Respiratory system: Clear to auscultation. Respiratory effort normal. Cardiovascular system: S1 & S2 heard, RRR. No JVD, murmurs, rubs, gallops or clicks. No pedal edema. Gastrointestinal system:  Abdomen is nondistended, soft and nontender. No organomegaly or masses felt. Normal bowel sounds heard. Central nervous system: Alert and oriented. No focal neurological deficits. Extremities: Symmetric 5 x 5 power.  Right third toe swollen discolored see media section  Data Reviewed: I have personally reviewed following labs and imaging studies  CBC: Recent Labs  Lab 04/17/24 1152 04/17/24 2029 04/18/24 0924  WBC 6.0 6.5 6.5  NEUTROABS 3.8  --   --   HGB 12.3 11.9* 11.9*  HCT 36.3 35.8* 35.8*  MCV 81.0 80.8 80.8  PLT 238 246 242   Basic Metabolic Panel: Recent Labs  Lab 04/17/24 1152 04/17/24 2029 04/18/24 0924  NA 144  --  143  K 4.0  --  4.3  CL 108  --  108  CO2 24  --  26  GLUCOSE 94  --  117*  BUN 11  --  8  CREATININE 0.88 0.89 0.94  CALCIUM  9.7  --  9.2   GFR: CrCl cannot be calculated (Unknown ideal weight.). Liver Function Tests: Recent Labs  Lab 04/17/24 1152 04/18/24 0924  AST 20 21  ALT 7 10  ALKPHOS 107 80  BILITOT 0.8 0.6  PROT 6.5 5.8*  ALBUMIN  4.0 3.1*   No results for input(s): LIPASE, AMYLASE in the last 168 hours. No results for input(s): AMMONIA in the last 168 hours. Coagulation Profile: Recent Labs  Lab 04/18/24 0924  INR 1.7*   Cardiac Enzymes: No results for input(s): CKTOTAL, CKMB, CKMBINDEX, TROPONINI in the last 168 hours. BNP (last 3 results) No results for input(s): PROBNP in the last 8760 hours. HbA1C: Recent Labs    04/17/24 1430  HGBA1C 5.6   CBG: Recent Labs  Lab 04/18/24 1121 04/18/24 1655 04/18/24 2210 04/19/24 0624 04/19/24 1137  GLUCAP 126* 111* 117* 105* 107*   Lipid Profile: No results for input(s): CHOL, HDL, LDLCALC, TRIG, CHOLHDL, LDLDIRECT in the last 72 hours. Thyroid  Function Tests: No results for input(s): TSH, T4TOTAL, FREET4, T3FREE, THYROIDAB in the last 72 hours. Anemia Panel: No results for input(s): VITAMINB12, FOLATE, FERRITIN, TIBC,  IRON, RETICCTPCT in the last 72 hours. Sepsis Labs: Recent Labs  Lab 04/17/24 1215  LATICACIDVEN 1.4  2.7*    Recent Results (from the past 240 hours)  Blood culture (routine x 2)     Status: None (Preliminary result)   Collection Time: 04/17/24 12:04 PM   Specimen: BLOOD RIGHT ARM  Result Value Ref Range Status   Specimen Description BLOOD RIGHT ARM  Final   Special Requests   Final    BOTTLES DRAWN AEROBIC AND ANAEROBIC Blood Culture adequate volume   Culture   Final    NO GROWTH 2 DAYS Performed at [facility], [address], [place], [place] [address]    Report Status PENDING  Incomplete  Blood culture (routine x 2)     Status: None (Preliminary result)   Collection Time: 04/17/24 12:09 PM   Specimen: BLOOD LEFT HAND  Result Value Ref Range Status   Specimen Description BLOOD LEFT HAND  Final   Special Requests   Final    BOTTLES DRAWN AEROBIC AND ANAEROBIC Blood Culture adequate volume   Culture   Final    NO GROWTH 2 DAYS Performed at [facility], [address], [place], [place] [address]    Report Status PENDING  Incomplete         Radiology Studies: VAS US  ABI WITH/WO TBI Result Date: 04/18/2024  LOWER EXTREMITY DOPPLER STUDY Patient Name:  NICOLAS SISLER  Date of Exam:   04/18/2024 Medical Rec #: [id number]        Accession #:    [id number] Date of Birth: Jun 16, 1946        Patient Gender: F Patient Age:   19 years Exam Location:  [facility] Procedure:      VAS US  ABI WITH/WO TBI Referring Phys: PENNE COLORADO --------------------------------------------------------------------------------  Indications: Peripheral artery disease. High Risk Factors: Hypertension, hyperlipidemia, Diabetes.  Comparison Study: 01/20/2024 - Right: Resting right ankle-brachial index is within                   normal range. The right toe-brachial index is normal.  Left: Resting left ankle-brachial index is within normal                   range. The  left toe-brachial index is normal. Performing Technologist: Gerome Ny RVT  Examination Guidelines: A complete evaluation includes at minimum, Doppler waveform signals and systolic blood pressure reading at the level of bilateral brachial, anterior tibial, and posterior tibial arteries, when vessel segments are accessible. Bilateral testing is considered an integral part of a complete examination. Photoelectric Plethysmograph (PPG) waveforms and toe systolic pressure readings are included as required and additional duplex testing as needed. Limited examinations for reoccurring indications may be performed as noted.  ABI Findings: +---------+------------------+-----+-----------+--------+ Right    Rt Pressure (mmHg)IndexWaveform   Comment  +---------+------------------+-----+-----------+--------+ Brachial 142                    triphasic           +---------+------------------+-----+-----------+--------+ PTA      192               1.28 multiphasic         +---------+------------------+-----+-----------+--------+ DP       184               1.23 multiphasic         +---------+------------------+-----+-----------+--------+ Great Toe200               1.33                     +---------+------------------+-----+-----------+--------+ +---------+------------------+-----+-----------+-------+ Left     Lt Pressure (mmHg)IndexWaveform   Comment +---------+------------------+-----+-----------+-------+ Brachial 150                    triphasic          +---------+------------------+-----+-----------+-------+ PTA      169               1.13 multiphasic        +---------+------------------+-----+-----------+-------+ DP       152               1.01 multiphasic        +---------+------------------+-----+-----------+-------+ Great Toe150               1.00                    +---------+------------------+-----+-----------+-------+  +-------+-----------+-----------+------------+------------+ ABI/TBIToday's ABIToday's TBIPrevious ABIPrevious TBI +-------+-----------+-----------+------------+------------+ Right  1.28       1.33       1.01        1.01         +-------+-----------+-----------+------------+------------+ Left   1.13       1.00       1.02        0.88         +-------+-----------+-----------+------------+------------+   Summary: Right: Resting right ankle-brachial index is within normal range. The right toe-brachial index is abnormal.  Left: Resting left ankle-brachial index is within normal range. The left toe-brachial index is normal.  *See table(s) above for measurements and observations.     Preliminary    MR FOOT RIGHT W WO CONTRAST Result Date: 04/18/2024 MR FOOT WITHOUT THEN WITH IV CONTRAST COMPARISON: X-rays and 3 2025 CLINICAL HISTORY: Osteomyelitis, foot swelling, diabetic PULSE SEQUENCES: Ax T1, Ax T2 FS, Sag T1, Sag T2 FS, Cor STIR, Cor T1 with and without IV contrast. FINDINGS: Mild degenerative changes are identified in the midfoot and forefoot. There is a metallic  surgical wire in the distal fifth metatarsal. There is destruction and significant reactive edema in the third toe consistent with septic arthritis osteomyelitis. There is remodeling of the proximal phalanx. No involvement of the third metatarsal at the current time. There is a wound of the third toe with moderate subcutaneous edema. No drainable collection is appreciated. Moderate reactive cellulitis is seen. There is mild fatty atrophy of the intrinsic muscles of the foot. No significant tenosynovitis or tendinosis is present. IMPRESSION: Osteomyelitis of the third toe centered at the interphalangeal joint. There is destruction of the distal aspect of the third proximal phalanx. Soft tissue swelling and wound. No drainable collection. Electronically signed by: Norleen Satchel MD 04/18/2024 10:33 AM EDT RP Workstation: [id number]    Scheduled Meds:  acetaminophen   1,000 mg Oral BID   anastrozole   1 mg Oral Daily   atorvastatin   10 mg Oral Daily   budesonide   0.5 mg Nebulization Daily   enoxaparin (LOVENOX) injection  40 mg Subcutaneous Q24H   fluticasone  furoate-vilanterol  1 puff Inhalation Daily   gabapentin   300 mg Oral BID   gabapentin   600 mg Oral QHS   insulin  aspart  0-9 Units Subcutaneous TID WC   irbesartan   150 mg Oral Daily   methimazole   5 mg Oral Daily   montelukast   10 mg Oral QHS   pantoprazole   40 mg Oral BID   Continuous Infusions:  cefTRIAXone  (ROCEPHIN )  IV 2 g (04/18/24 2202)   And   metronidazole  500 mg (04/19/24 0600)   And   linezolid (ZYVOX) IV 600 mg (04/19/24 1000)     LOS: 2 days   Alicia KANDICE Hoots, MD 04/19/2024, 1:06 PM

## 2024-04-19 NOTE — Plan of Care (Signed)
  Problem: Health Behavior/Discharge Planning: Goal: Ability to identify and utilize available resources and services will improve Outcome: Progressing   Problem: Skin Integrity: Goal: Risk for impaired skin integrity will decrease Outcome: Progressing   Problem: Safety: Goal: Ability to remain free from injury will improve Outcome: Progressing

## 2024-04-19 NOTE — Progress Notes (Signed)
  Daily Progress Note   Subjective: No complaints in good spirits today  Objective: Vitals:   04/19/24 0818 04/19/24 0915  BP: 135/71   Pulse: 73   Resp: 18   Temp: 98.2 F (36.8 C)   SpO2: 99% 98%    Physical Examination HDS Nonlabored breathing  ASSESSMENT/PLAN:  78 year old female with a right third toe ulcer and diabetic infection.  Her ABIs demonstrate reasonable perfusion for healing but given the severe wound and infection of the toe we will plan to continue with angiogram tomorrow in the Cath Lab.  Podiatry is involved and we will need third toe amputation after revascularization.   Norman GORMAN Serve MD [facility] [phone] 04/19/2024  9:36 AM

## 2024-04-19 NOTE — Plan of Care (Signed)
   Problem: Education: Goal: Knowledge of General Education information will improve Description: Including pain rating scale, medication(s)/side effects and non-pharmacologic comfort measures Outcome: Progressing   Problem: Clinical Measurements: Goal: Will remain free from infection Outcome: Progressing

## 2024-04-20 ENCOUNTER — Telehealth: Payer: Self-pay | Admitting: *Deleted

## 2024-04-20 ENCOUNTER — Encounter (HOSPITAL_COMMUNITY)
Admission: EM | Disposition: A | Discharge status: Home / Self Care | Payer: Self-pay | Source: Home / Self Care | Attending: Internal Medicine

## 2024-04-20 DIAGNOSIS — S91104A Unspecified open wound of right lesser toe(s) without damage to nail, initial encounter: Secondary | ICD-10-CM | POA: Diagnosis not present

## 2024-04-20 DIAGNOSIS — L089 Local infection of the skin and subcutaneous tissue, unspecified: Secondary | ICD-10-CM | POA: Diagnosis not present

## 2024-04-20 DIAGNOSIS — E11628 Type 2 diabetes mellitus with other skin complications: Secondary | ICD-10-CM | POA: Diagnosis not present

## 2024-04-20 HISTORY — PX: LOWER EXTREMITY ANGIOGRAPHY: CATH118251

## 2024-04-20 LAB — C-REACTIVE PROTEIN: CRP: <0.5 mg/dL (ref <1.0)

## 2024-04-20 LAB — GLUCOSE, CAPILLARY
Glucose-Capillary: 101 mg/dL — ABNORMAL HIGH (ref 70–99)
Glucose-Capillary: 122 mg/dL — ABNORMAL HIGH (ref 70–99)
Glucose-Capillary: 85 mg/dL (ref 70–99)

## 2024-04-20 LAB — SEDIMENTATION RATE: Sed Rate: 13 mm/h (ref 0–22)

## 2024-04-20 LAB — VAS US ABI WITH/WO TBI
Left ABI: 1.13
Right ABI: 1.28

## 2024-04-20 MED ORDER — SODIUM CHLORIDE 0.9 % IV SOLN: INTRAVENOUS | Status: DC

## 2024-04-20 MED ORDER — LIDOCAINE HCL (PF) 1 % IJ SOLN
INTRAMUSCULAR | Status: AC
  Filled 2024-04-20: qty 30

## 2024-04-20 MED ORDER — FENTANYL CITRATE (PF) 100 MCG/2ML IJ SOLN
INTRAMUSCULAR | Status: DC | PRN
  Administered 2024-04-20: 50 ug via INTRAVENOUS

## 2024-04-20 MED ORDER — MIDAZOLAM HCL 2 MG/2ML IJ SOLN
INTRAMUSCULAR | Status: AC
  Filled 2024-04-20: qty 2

## 2024-04-20 MED ORDER — LIDOCAINE HCL (PF) 1 % IJ SOLN
INTRAMUSCULAR | Status: DC | PRN
  Administered 2024-04-20: 10 mL via INTRADERMAL

## 2024-04-20 MED ORDER — ACETAMINOPHEN 325 MG PO TABS
650.0000 mg | ORAL_TABLET | ORAL | Status: DC | PRN
  Administered 2024-04-22: 650 mg via ORAL
  Filled 2024-04-20: qty 2

## 2024-04-20 MED ORDER — ONDANSETRON HCL 4 MG/2ML IJ SOLN: 4.0000 mg | Freq: Four times a day (QID) | INTRAMUSCULAR | Status: DC | PRN

## 2024-04-20 MED ORDER — IODIXANOL 320 MG/ML IV SOLN
INTRAVENOUS | Status: DC | PRN
  Administered 2024-04-20: 50 mL

## 2024-04-20 MED ORDER — SODIUM CHLORIDE 0.9% FLUSH
3.0000 mL | Freq: Two times a day (BID) | INTRAVENOUS | Status: DC
  Administered 2024-04-20 – 2024-04-23 (×4): 3 mL via INTRAVENOUS

## 2024-04-20 MED ORDER — MIDAZOLAM HCL 2 MG/2ML IJ SOLN
INTRAMUSCULAR | Status: DC | PRN
  Administered 2024-04-20: 1 mg via INTRAVENOUS

## 2024-04-20 MED ORDER — SODIUM CHLORIDE 0.9 % IV SOLN: INTRAVENOUS | Status: AC

## 2024-04-20 MED ORDER — SODIUM CHLORIDE 0.9 % IV SOLN: 250.0000 mL | INTRAVENOUS | Status: DC | PRN

## 2024-04-20 MED ORDER — SODIUM CHLORIDE 0.9% FLUSH: 3.0000 mL | INTRAVENOUS | Status: DC | PRN

## 2024-04-20 MED ORDER — FENTANYL CITRATE (PF) 100 MCG/2ML IJ SOLN
INTRAMUSCULAR | Status: AC
  Filled 2024-04-20: qty 2

## 2024-04-20 MED ORDER — HEPARIN (PORCINE) IN NACL 1000-0.9 UT/500ML-% IV SOLN
INTRAVENOUS | Status: DC | PRN
  Administered 2024-04-20 (×2): 500 mL

## 2024-04-20 MED ORDER — LABETALOL HCL 5 MG/ML IV SOLN: 10.0000 mg | INTRAVENOUS | Status: DC | PRN

## 2024-04-20 MED ORDER — HYDRALAZINE HCL 20 MG/ML IJ SOLN: 5.0000 mg | INTRAMUSCULAR | Status: DC | PRN

## 2024-04-20 NOTE — Plan of Care (Signed)

## 2024-04-20 NOTE — Plan of Care (Signed)
  Problem: Skin Integrity: Goal: Risk for impaired skin integrity will decrease Outcome: Progressing   Problem: Coping: Goal: Level of anxiety will decrease Outcome: Progressing   Problem: Safety: Goal: Ability to remain free from injury will improve Outcome: Progressing

## 2024-04-20 NOTE — Telephone Encounter (Signed)
 Copied from CRM [id number]. Topic: General - Other >> Apr 20, 2024  2:34 PM Berneda FALCON wrote: Reason for CRM: Patient is returning phone call to Randall but wanted her to know that she is still in the hospital and is scheduled to have another surgery, not sure when it will be.  Patient callback is - [phone]

## 2024-04-20 NOTE — Progress Notes (Signed)
   PODIATRY PROGRESS NOTE Patient Name: [name]  DOB 1946/01/09 DOA 04/17/2024  Hospital Day: 4  Assessment:  78 y.o. female with hx type 2 diabetes, atrial flutter on Xarelto , DCIS of left breast, Graves' disease, GERD, PE and DVT, hypertension, hyperlipidemia, obesity, obstructive sleep apnea on CPAP, neuropathy, pulmonary hypertension, sick sinus syndrome with Right third toe osteomyelitis.   AF, VSS  WBC: 6.5 ESR/CRP: 13/ < 0.5  Wound/Bone Cultures: pending OR  Imaging: MRI R foot: Osteomyelitis of the third toe centered at the interphalangeal joint. There is destruction of the distal aspect of the third proximal phalanx.  Plan:  - Plan for amputation R third toe on Wednesday am 1000, npo P mn prior for oR, she agrees to proceed - Appreciate vascular surgery, inline flow to R foot on angiogram - Continue abx per op, will deescalate to po abx x 5 days post op - Wbat in post op shoe following surgery - Anticipate will be ok to dc day after procedure Will continue to follow        [name], DPM [facility]    Subjective:  Discussed with pt post angiogram, doing well has some knee pain but denies right foot pain. We discussed MRI findings and plan for amputation on Wednesday, she is agreeable.   Objective:   Vitals:   04/20/24 0818 04/20/24 0823  BP: (!) 148/79 (!) 166/67  Pulse: 60 60  Resp: 17 18  Temp:    SpO2: 100%        Latest Ref Rng & Units 04/18/2024    9:24 AM 04/17/2024    8:29 PM 04/17/2024   11:52 AM  CBC  WBC 4.0 - 10.5 K/uL 6.5  6.5  6.0   Hemoglobin 12.0 - 15.0 g/dL 88.0  88.0  87.6   Hematocrit 36.0 - 46.0 % 35.8  35.8  36.3   Platelets 150 - 400 K/uL 242  246  238        Latest Ref Rng & Units 04/18/2024    9:24 AM 04/17/2024    8:29 PM 04/17/2024   11:52 AM  BMP  Glucose 70 - 99 mg/dL 882   94   BUN 8 - 23 mg/dL 8   11   Creatinine 9.55 - 1.00 mg/dL 9.05  9.10  9.11   Sodium 135 - 145 mmol/L 143   144    Potassium 3.5 - 5.1 mmol/L 4.3   4.0   Chloride 98 - 111 mmol/L 108   108   CO2 22 - 32 mmol/L 26   24   Calcium  8.9 - 10.3 mg/dL 9.2   9.7     General: AAOx3, NAD  Lower Extremity Exam  Right foot DP and PT 2+  Edema and mild erythema of right third toe, palpable fluid collection dorsally,  Sensation diminished to forefoot  Prior tailors bunionectomy with k wire fixation        Radiology:  Results reviewed. See assessment for pertinent imaging results

## 2024-04-20 NOTE — Progress Notes (Signed)
 Pt arrived from ...cath.., A/ox 4...pt denies any pain, MD aware,CCMD called. CHG bath given,no further needs at this time

## 2024-04-20 NOTE — Progress Notes (Signed)
 PROGRESS NOTE    [name]  FMW:[id number] DOB: 09-Nov-1945 DOA: 04/17/2024 PCP: Avelina Greig BRAVO, MD  Brief Narrative:78 y.o. female with medical history significant of type 2 diabetes, atrial flutter on Xarelto , DCIS of left breast, Graves' disease, GERD, PE and DVT, hypertension, hyperlipidemia, obesity, obstructive sleep apnea on CPAP, neuropathy, pulmonary hypertension, sick sinus syndrome admitted with complaints of worsening wound increasing pain to the right second toe.  She noticed a blister on her right third toe 3 weeks ago and she was seen by podiatry was placed on antibiotics without any improvement.  She was given clindamycin  and then changed to doxycycline .  In the ED she had a right foot x-ray with no fracture or dislocation.  She was given meropenem and Zyvox and a fluid bolus.  ED physician discussed with Dr. Sandefur with podiatry.  He recommended MRI of the foot right foot.  And admitted for IV antibiotics.  Denied fever or chills nausea vomiting diarrhea abdominal pain urinary complaints chest pain shortness of breath or cough.  Patient has been seen by vascular who recommended to hold Xarelto  and planning for aortogram on Monday, ABIs ordered  Assessment & Plan:   Principal Problem:   Diabetic foot infection (HCC) Active Problems:   Hypertension associated with diabetes (HCC)   Neuropathy due to secondary diabetes (HCC)   Moderate persistent asthma   Obstructive sleep apnea treated with continuous positive airway pressure (CPAP)   Hyperlipidemia associated with type 2 diabetes mellitus (HCC)   Type 2 diabetes mellitus with diabetic neuropathy, unspecified (HCC)   Hyperthyroidism   Diabetic foot (HCC)   Acute osteomyelitis of toe of right foot (HCC)    #1 Diabetic foot with infected right third toe-patient admitted  patient admitted with right third toe swelling drainage and pain for the last  3 weeks.  It started with will blister 3 weeks ago.  Failed outpatient  management with clindamycin  and doxycycline .  Her pain started to get worse with increased drainage and swelling.  She decided to come to the ED for this.  She has no leukocytosis, sed rate and CRP are normal. ABI resting right ABI index is within normal range, the right toe-brachial index is abnormal.  Left side is normal.  MRI confirms right third toe osteomyelitis.  Patient followed by vascular and podiatry.  She is status post aortogram and bilateral lower extremity angiogram with findings as follows-Widely patent aorta and bilateral renal arteries.  Widely patent iliac systems bilaterally. Right common femoral, SFA and profunda are widely patent.  Popliteal artery is widely patent without any flow-limiting stenosis and there is three-vessel runoff with a dominant AT and diminutive PT. Left common femoral, SFA and profunda are widely patent.  Popliteal artery is widely patent without any flow-limiting stenosis and there is three-vessel runoff with a dominant AT integrated PT. Continue Rocephin  Flagyl  and Zyvox.  Xarelto  on hold for  surgery right third toe amputation  n.p.o. after midnight Tuesday  #2 type 2 diabetes controlled hemoglobin A1c this admission is 5.8 Will place her on sliding scale insulin  hold SGLT2 and GLP-1 CBG (last 3)  Recent Labs    04/19/24 1605 04/19/24 2130 04/20/24 0639  GLUCAP 91 117* 101*     #3 history of Graves' disease continue methimazole  TSH 4.23 on July 2025.   #4 obstructive sleep apnea on CPAP continue   #5 essential hypertension continue Avapro    #6 asthma with rhinitis continue Singulair  breathing nebs Breo Ellipta.   #7 neuropathy secondary  to type 2 diabetes continue Neurontin .    Estimated body mass index is 34.44 kg/m as calculated from the following:   Height as of this encounter: 5' 3.75 (1.619 m).   Weight as of this encounter: 90.3 kg.  DVT prophylaxis: lovenox Code Status: full Family Communication: none Disposition Plan:   Status is: Inpatient Remains inpatient appropriate because: acute illness   Consultants: vascular podiatry  Procedures: none Antimicrobials: Anti-infectives (From admission, onward)    Start     Dose/Rate Route Frequency Ordered Stop   04/18/24 0200  linezolid (ZYVOX) IVPB 600 mg       Placed in And Linked Group   600 mg 300 mL/hr over 60 Minutes Intravenous Every 12 hours 04/17/24 1406 04/24/24 0959   04/17/24 2000  cefTRIAXone  (ROCEPHIN ) 2 g in sodium chloride  0.9 % 100 mL IVPB  Status:  Discontinued       Placed in And Linked Group   2 g 200 mL/hr over 30 Minutes Intravenous Every 24 hours 04/17/24 1358 04/17/24 1406   04/17/24 2000  metroNIDAZOLE  (FLAGYL ) IVPB 500 mg  Status:  Discontinued       Placed in And Linked Group   500 mg 100 mL/hr over 60 Minutes Intravenous Every 8 hours 04/17/24 1358 04/17/24 1406   04/17/24 2000  cefTRIAXone  (ROCEPHIN ) 2 g in sodium chloride  0.9 % 100 mL IVPB       Placed in And Linked Group   2 g 200 mL/hr over 30 Minutes Intravenous Every 24 hours 04/17/24 1406 04/24/24 1959   04/17/24 1430  metroNIDAZOLE  (FLAGYL ) IVPB 500 mg       Placed in And Linked Group   500 mg 100 mL/hr over 60 Minutes Intravenous Every 8 hours 04/17/24 1406 04/24/24 1429   04/17/24 1400  linezolid (ZYVOX) IVPB 600 mg  Status:  Discontinued       Placed in And Linked Group   600 mg 300 mL/hr over 60 Minutes Intravenous Every 12 hours 04/17/24 1358 04/17/24 1406   04/17/24 1215  meropenem (MERREM) 1 g in sodium chloride  0.9 % 100 mL IVPB       Placed in And Linked Group   1 g 200 mL/hr over 30 Minutes Intravenous  Once 04/17/24 1208 04/17/24 1320   04/17/24 1215  linezolid (ZYVOX) IVPB 600 mg       Placed in And Linked Group   600 mg 300 mL/hr over 60 Minutes Intravenous  Once 04/17/24 1208 04/17/24 1426       Subjective:  No new events overnight Anxious to have the procedure Objective: Vitals:   04/20/24 1300 04/20/24 1315 04/20/24 1330  04/20/24 1400  BP: 126/71 138/67 (!) 143/63 (!) 154/76  Pulse: (!) 57 68 (!) 59 (!) 59  Resp: 12 19 18 15   Temp:      TempSrc:      SpO2: 100% 100% 100%   Weight:      Height:        Intake/Output Summary (Last 24 hours) at 04/20/2024 1437 Last data filed at 04/19/2024 1700 Gross per 24 hour  Intake 240 ml  Output --  Net 240 ml    Filed Weights   04/20/24 0416  Weight: 90.3 kg    Examination:  General exam: Appears  in nad Respiratory system: Clear to auscultation. Respiratory effort normal. Cardiovascular system: S1 & S2 heard, RRR. No JVD, murmurs, rubs, gallops or clicks. No pedal edema. Gastrointestinal system: Abdomen is nondistended, soft and nontender. No organomegaly or  masses felt. Normal bowel sounds heard. Central nervous system: Alert and oriented. No focal neurological deficits. Extremities: Symmetric 5 x 5 power.  Right third toe swollen discolored see media section  Data Reviewed: I have personally reviewed following labs and imaging studies  CBC: Recent Labs  Lab 04/17/24 1152 04/17/24 2029 04/18/24 0924  WBC 6.0 6.5 6.5  NEUTROABS 3.8  --   --   HGB 12.3 11.9* 11.9*  HCT 36.3 35.8* 35.8*  MCV 81.0 80.8 80.8  PLT 238 246 242   Basic Metabolic Panel: Recent Labs  Lab 04/17/24 1152 04/17/24 2029 04/18/24 0924  NA 144  --  143  K 4.0  --  4.3  CL 108  --  108  CO2 24  --  26  GLUCOSE 94  --  117*  BUN 11  --  8  CREATININE 0.88 0.89 0.94  CALCIUM  9.7  --  9.2   GFR: Estimated Creatinine Clearance: 54.3 mL/min (by C-G formula based on SCr of 0.94 mg/dL). Liver Function Tests: Recent Labs  Lab 04/17/24 1152 04/18/24 0924  AST 20 21  ALT 7 10  ALKPHOS 107 80  BILITOT 0.8 0.6  PROT 6.5 5.8*  ALBUMIN  4.0 3.1*   No results for input(s): LIPASE, AMYLASE in the last 168 hours. No results for input(s): AMMONIA in the last 168 hours. Coagulation Profile: Recent Labs  Lab 04/18/24 0924  INR 1.7*   Cardiac Enzymes: No results  for input(s): CKTOTAL, CKMB, CKMBINDEX, TROPONINI in the last 168 hours. BNP (last 3 results) No results for input(s): PROBNP in the last 8760 hours. HbA1C: No results for input(s): HGBA1C in the last 72 hours.  CBG: Recent Labs  Lab 04/19/24 0624 04/19/24 1137 04/19/24 1605 04/19/24 2130 04/20/24 0639  GLUCAP 105* 107* 91 117* 101*   Lipid Profile: No results for input(s): CHOL, HDL, LDLCALC, TRIG, CHOLHDL, LDLDIRECT in the last 72 hours. Thyroid  Function Tests: No results for input(s): TSH, T4TOTAL, FREET4, T3FREE, THYROIDAB in the last 72 hours. Anemia Panel: No results for input(s): VITAMINB12, FOLATE, FERRITIN, TIBC, IRON, RETICCTPCT in the last 72 hours. Sepsis Labs: Recent Labs  Lab 04/17/24 1215  LATICACIDVEN 1.4  2.7*    Recent Results (from the past 240 hours)  Blood culture (routine x 2)     Status: None (Preliminary result)   Collection Time: 04/17/24 12:04 PM   Specimen: BLOOD RIGHT ARM  Result Value Ref Range Status   Specimen Description BLOOD RIGHT ARM  Final   Special Requests   Final    BOTTLES DRAWN AEROBIC AND ANAEROBIC Blood Culture adequate volume   Culture   Final    NO GROWTH 3 DAYS Performed at [facility], [address], [place], [place] [address]    Report Status PENDING  Incomplete  Blood culture (routine x 2)     Status: None (Preliminary result)   Collection Time: 04/17/24 12:09 PM   Specimen: BLOOD LEFT HAND  Result Value Ref Range Status   Specimen Description BLOOD LEFT HAND  Final   Special Requests   Final    BOTTLES DRAWN AEROBIC AND ANAEROBIC Blood Culture adequate volume   Culture   Final    NO GROWTH 3 DAYS Performed at [facility], [address], [place], [place] [address]    Report Status PENDING  Incomplete         Radiology Studies: PERIPHERAL VASCULAR CATHETERIZATION Result Date: 04/20/2024 Images from the original result were not included.  Patient  name: [name]         MRN: [id number]        DOB: 07-19-1945          Sex: female  04/20/2024 Pre-operative Diagnosis: Right third toe wound Post-operative diagnosis:  Same Surgeon:  Norman GORMAN Serve, MD Procedure Performed:  Ultrasound-guided access of left common femoral artery Aortogram and bilateral lower extremity angiogram Closure of the left common femoral artery 19 minutes of moderate sedation with fentanyl  and Versed   Indications: Ms. Esteve is a 78 year old female with diabetes who is admitted with a right third toe wound.  Podiatry is planning for third toe amputation.  There are some benefits of angiogram were reviewed and she elects to proceed.  Findings: Widely patent aorta and bilateral renal arteries.  Widely patent iliac systems bilaterally. Right common femoral, SFA and profunda are widely patent.  Popliteal artery is widely patent without any flow-limiting stenosis and there is three-vessel runoff with a dominant AT and diminutive PT. Left common femoral, SFA and profunda are widely patent.  Popliteal artery is widely patent without any flow-limiting stenosis and there is three-vessel runoff with a dominant AT integrated PT.             Procedure:  The patient was identified in the holding area and taken to the cath lab  The patient was then placed supine on the table and prepped and draped in the usual sterile fashion.  A time out was called.  Ultrasound was used to evaluate the left common femoral artery.  It was patent .  A digital ultrasound image was acquired.  A micropuncture needle was used to access the left common femoral artery under ultrasound guidance.  An 018 wire was advanced without resistance and a micropuncture sheath was placed.  The 018 wire was removed and a benson wire was placed.  The micropuncture sheath was exchanged for a 5 french sheath.  An omniflush catheter was advanced over the wire to the level of L-1.  An abdominal angiogram was obtained.  Next, using the  omniflush catheter and a Bentson wire, the aortic bifurcation was crossed and the catheter was placed into theleft external iliac artery and left runoff was obtained. This demonstrated the above findings. right runoff was performed via retrograde sheath injections which demonstrated the above findings.  A Mynx closure device was then deployed into the left common femoral artery access with excellent hemostasis.  Contrast: 50cc Sedation: 19 minutes  Impression: Inline flow to bilateral feet with dominant ATs bilaterally   Norman GORMAN Serve MD [facility] [place] Office: [phone]   Scheduled Meds:  acetaminophen   1,000 mg Oral BID   anastrozole   1 mg Oral Daily   atorvastatin   10 mg Oral Daily   budesonide   0.5 mg Nebulization Daily   enoxaparin (LOVENOX) injection  40 mg Subcutaneous Q24H   fluticasone  furoate-vilanterol  1 puff Inhalation Daily   gabapentin   300 mg Oral BID   gabapentin   600 mg Oral QHS   insulin  aspart  0-9 Units Subcutaneous TID WC   irbesartan   150 mg Oral Daily   methimazole   5 mg Oral Daily   montelukast   10 mg Oral QHS   pantoprazole   40 mg Oral BID   sodium chloride  flush  3 mL Intravenous Q12H   Continuous Infusions:  sodium chloride  100 mL/hr at 04/20/24 0523   sodium chloride      cefTRIAXone  (ROCEPHIN )  IV 2 g (04/19/24 2100)  And   metronidazole  500 mg (04/20/24 0527)   And   linezolid (ZYVOX) IV 600 mg (04/19/24 2141)     LOS: 3 days   Almarie KANDICE Hoots, MD 04/20/2024, 2:37 PM

## 2024-04-20 NOTE — Op Note (Signed)
    Patient name: [name] MRN: [id number] DOB: [birth year] Sex: female  04/20/2024 Pre-operative Diagnosis: Right third toe wound Post-operative diagnosis:  Same Surgeon:  Norman GORMAN Serve, MD Procedure Performed:  Ultrasound-guided access of left common femoral artery Aortogram and bilateral lower extremity angiogram Closure of the left common femoral artery 19 minutes of moderate sedation with fentanyl  and Versed   Indications: [name] is a 78 year old female with diabetes who is admitted with a right third toe wound.  Podiatry is planning for third toe amputation.  There are some benefits of angiogram were reviewed and she elects to proceed.  Findings:  Widely patent aorta and bilateral renal arteries.  Widely patent iliac systems bilaterally. Right common femoral, SFA and profunda are widely patent.  Popliteal artery is widely patent without any flow-limiting stenosis and there is three-vessel runoff with a dominant AT and diminutive PT. Left common femoral, SFA and profunda are widely patent.  Popliteal artery is widely patent without any flow-limiting stenosis and there is three-vessel runoff with a dominant AT integrated PT.   Procedure:  The patient was identified in the holding area and taken to the cath lab  The patient was then placed supine on the table and prepped and draped in the usual sterile fashion.  A time out was called.  Ultrasound was used to evaluate the left common femoral artery.  It was patent .  A digital ultrasound image was acquired.  A micropuncture needle was used to access the left common femoral artery under ultrasound guidance.  An 018 wire was advanced without resistance and a micropuncture sheath was placed.  The 018 wire was removed and a benson wire was placed.  The micropuncture sheath was exchanged for a 5 french sheath.  An omniflush catheter was advanced over the wire to the level of L-1.  An abdominal angiogram was obtained.  Next, using the  omniflush catheter and a Bentson wire, the aortic bifurcation was crossed and the catheter was placed into theleft external iliac artery and left runoff was obtained. This demonstrated the above findings. right runoff was performed via retrograde sheath injections which demonstrated the above findings.  A Mynx closure device was then deployed into the left common femoral artery access with excellent hemostasis.  Contrast: 50cc  Sedation: 19 minutes  Impression: Inline flow to bilateral feet with dominant ATs bilaterally   Norman GORMAN Serve MD [facility] [place] Office: [phone]

## 2024-04-21 ENCOUNTER — Encounter: Admitting: Speech Pathology

## 2024-04-21 ENCOUNTER — Encounter (HOSPITAL_COMMUNITY): Payer: Self-pay | Admitting: Vascular Surgery

## 2024-04-21 DIAGNOSIS — L089 Local infection of the skin and subcutaneous tissue, unspecified: Secondary | ICD-10-CM | POA: Diagnosis not present

## 2024-04-21 DIAGNOSIS — E11628 Type 2 diabetes mellitus with other skin complications: Secondary | ICD-10-CM | POA: Diagnosis not present

## 2024-04-21 LAB — GLUCOSE, CAPILLARY
Glucose-Capillary: 93 mg/dL (ref 70–99)
Glucose-Capillary: 128 mg/dL — ABNORMAL HIGH (ref 70–99)
Glucose-Capillary: 122 mg/dL — ABNORMAL HIGH (ref 70–99)
Glucose-Capillary: 78 mg/dL (ref 70–99)
Glucose-Capillary: 114 mg/dL — ABNORMAL HIGH (ref 70–99)

## 2024-04-21 LAB — LIPID PANEL
Cholesterol: 94 mg/dL (ref 0–200)
HDL: 53 mg/dL (ref >40)
LDL Cholesterol: 33 mg/dL (ref 0–99)
Total CHOL/HDL Ratio: 1.8 ratio
Triglycerides: 39 mg/dL (ref <150)
VLDL: 8 mg/dL (ref 0–40)

## 2024-04-21 NOTE — Progress Notes (Signed)
 [facility] PROGRESS NOTE  [name] (DOB: February 10, 1946) FMW:[id number] PCP: Avelina Greig BRAVO, MD   Brief Narrative: 78 y.o. female with medical history significant of type 2 diabetes, atrial flutter on Xarelto , DCIS of left breast, Graves' disease, GERD, PE and DVT, hypertension, hyperlipidemia, obesity, obstructive sleep apnea on CPAP, neuropathy, pulmonary hypertension, sick sinus syndrome admitted with complaints of worsening wound increasing pain to the right second toe.  She noticed a blister on her right third toe 3 weeks ago and she was seen by podiatry was placed on antibiotics without any improvement.  She was given clindamycin  and then changed to doxycycline .  In the ED she had a right foot x-ray with no fracture or dislocation.  She was given meropenem and Zyvox and a fluid bolus.  ED physician discussed with Dr. Sandefur with podiatry.  He recommended MRI of the foot right foot.  And admitted for IV antibiotics.  Denied fever or chills nausea vomiting diarrhea abdominal pain urinary complaints chest pain shortness of breath or cough.  Patient has been seen by vascular who recommended to hold Xarelto  and planning for aortogram on Monday, ABIs ordered   Subjective: No complaints, eager to get surgery completed. No fevers. Pain controlled.  Objective: BP (!) 143/82 (BP Location: Left Arm)   Pulse 70   Temp 97.6 F (36.4 C) (Oral)   Resp 13   Ht 5' 3.75 (1.619 m)   Wt 90.3 kg   SpO2 100%   BMI 34.44 kg/m   Gen: No distress Pulm: Clear, nonlabored  CV: RRR GI: Soft, NT, ND, +BS  Neuro: Alert and oriented. No new focal deficits. Ext: Warm, no deformities Skin: right 3rd toe as pictured, edematous, discoloration noted. Palpable DP pulse.   Assessment & Plan: Principal Problem:   Diabetic foot infection (HCC) Active Problems:   Hypertension associated with diabetes (HCC)   Neuropathy due to secondary diabetes (HCC)   Moderate persistent asthma   Obstructive sleep  apnea treated with continuous positive airway pressure (CPAP)   Hyperlipidemia associated with type 2 diabetes mellitus (HCC)   Type 2 diabetes mellitus with diabetic neuropathy, unspecified (HCC)   Hyperthyroidism   Diabetic foot (HCC)   Acute osteomyelitis of toe of right foot (HCC)  Right 3rd toe osteomyelitis:  - Amputation 10/8 at 10:00am per podiatry. NPO p MN. - Inline flow confirmed by vascular surgery.  - Continue abx - Will be WBAT in postop shoe after surgery.  - Will get PT evaluation 10/8 PM vs. 10/9 AM in preparation for discharge 10/9.   PAD: s/p aortogram, BL LE angiogram showing patent aorta and bilateral renals, iliac systems, CFA, SFA, profunda, popliteal and 3 vessel runoff  - Xarelto  on hold for surgery.  - No further revascularization required at this time.   NIDT2DM: Well-controlled with HbA1c 5.6%.  - Hold SGLT2i and GLP-1. - Continue SSI   Graves' disease: TSH 4.05 February 2024.  - Continue methimazole     OSA:  - CPAP qHS   HTN:  - Continue ARB   Asthma: Quiescent.  - No changes to home regimen planned.   Diabetic neuropathy:  - Continue gabapentin    Bernardino KATHEE Come, MD [facility] [email or web address] 04/21/2024, 3:24 PM

## 2024-04-21 NOTE — Progress Notes (Signed)
   PODIATRY PROGRESS NOTE Patient Name: [name]  DOB October 01, 1945 DOA 04/17/2024  Hospital Day: 5  Assessment:  78 y.o. female with hx type 2 diabetes, atrial flutter on Xarelto , DCIS of left breast, Graves' disease, GERD, PE and DVT, hypertension, hyperlipidemia, obesity, obstructive sleep apnea on CPAP, neuropathy, pulmonary hypertension, sick sinus syndrome with Right third toe osteomyelitis.   AF, VSS  WBC: 6.5 ESR/CRP: 13/ < 0.5  Wound/Bone Cultures: pending OR  Imaging: MRI R foot: Osteomyelitis of the third toe centered at the interphalangeal joint. There is destruction of the distal aspect of the third proximal phalanx.  Plan:  - Plan for amputation R third toe on Wednesday am 1000, npo P mn prior for oR, she agrees to proceed - Appreciate vascular surgery, inline flow to R foot on angiogram - Continue abx per op, will deescalate to po abx x 5 days post op - Wbat in post op shoe following surgery - Anticipate will be ok to dc day after procedure Will continue to follow        [name], DPM [facility]    Subjective:  Discussed with pt plan for toe amputation tomorrow am and she is agreeable aware of npo p mn for proc tmrw. All questions answered.   Objective:   Vitals:   04/21/24 0740 04/21/24 0814  BP: (!) 161/88   Pulse: 78 60  Resp: 18 13  Temp: 97.8 F (36.6 C)   SpO2: 100% 100%       Latest Ref Rng & Units 04/18/2024    9:24 AM 04/17/2024    8:29 PM 04/17/2024   11:52 AM  CBC  WBC 4.0 - 10.5 K/uL 6.5  6.5  6.0   Hemoglobin 12.0 - 15.0 g/dL 88.0  88.0  87.6   Hematocrit 36.0 - 46.0 % 35.8  35.8  36.3   Platelets 150 - 400 K/uL 242  246  238        Latest Ref Rng & Units 04/18/2024    9:24 AM 04/17/2024    8:29 PM 04/17/2024   11:52 AM  BMP  Glucose 70 - 99 mg/dL 882   94   BUN 8 - 23 mg/dL 8   11   Creatinine 9.55 - 1.00 mg/dL 9.05  9.10  9.11   Sodium 135 - 145 mmol/L 143   144   Potassium 3.5 - 5.1 mmol/L 4.3   4.0    Chloride 98 - 111 mmol/L 108   108   CO2 22 - 32 mmol/L 26   24   Calcium  8.9 - 10.3 mg/dL 9.2   9.7     General: AAOx3, NAD  Lower Extremity Exam  Right foot DP and PT 2+  Edema and mild erythema of right third toe, palpable fluid collection dorsally,  Sensation diminished to forefoot  Prior tailors bunionectomy with k wire fixation        Radiology:  Results reviewed. See assessment for pertinent imaging results

## 2024-04-21 NOTE — Plan of Care (Signed)

## 2024-04-21 NOTE — Progress Notes (Signed)
 PHARMACIST LIPID MONITORING   [name] is a 78 y.o. female admitted on 04/17/2024 with right third toe wound s/p angiogram.  Pharmacy has been consulted to optimize lipid-lowering therapy with the indication of secondary prevention for clinical ASCVD.  Recent Labs:  Lipid Panel (last 6 months):   Lab Results  Component Value Date   CHOL 94 04/21/2024   TRIG 39 04/21/2024   HDL 53 04/21/2024   CHOLHDL 1.8 04/21/2024   VLDL 8 04/21/2024   LDLCALC 33 04/21/2024    Hepatic function panel (last 6 months):   Lab Results  Component Value Date   AST 21 04/18/2024   ALT 10 04/18/2024   ALKPHOS 80 04/18/2024   BILITOT 0.6 04/18/2024    SCr (since admission):   Serum creatinine: 0.94 mg/dL 89/95/74 9075 Estimated creatinine clearance: 54.3 mL/min  Current therapy and lipid therapy tolerance Current lipid-lowering therapy: atorvastatin  10 mg  Previous lipid-lowering therapies (if applicable): n/a Documented or reported allergies or intolerances to lipid-lowering therapies (if applicable): n/a  Assessment:   Patient agrees with changes to lipid-lowering therapy  Plan:    1.Statin intensity (high intensity recommended for all patients regardless of the LDL):  No statin changes. The patient is already on a high intensity statin.  2.Add ezetimibe (if any one of the following):   Not indicated at this time.  3.Refer to lipid clinic:   No  4.Follow-up with:  Primary care provider - Avelina Greig BRAVO, MD  5.Follow-up labs after discharge:  No changes in lipid therapy, repeat a lipid panel in one year.     Thank you for allowing pharmacy to be a part of this patient's care.  Shelba Collier, PharmD, BCPS Clinical Pharmacist

## 2024-04-22 ENCOUNTER — Inpatient Hospital Stay (HOSPITAL_COMMUNITY)

## 2024-04-22 ENCOUNTER — Encounter (HOSPITAL_COMMUNITY)
Admission: EM | Disposition: A | Discharge status: Home / Self Care | Payer: Self-pay | Source: Home / Self Care | Attending: Internal Medicine

## 2024-04-22 ENCOUNTER — Encounter (HOSPITAL_COMMUNITY): Payer: Self-pay | Admitting: Internal Medicine

## 2024-04-22 ENCOUNTER — Other Ambulatory Visit: Payer: Self-pay

## 2024-04-22 DIAGNOSIS — E039 Hypothyroidism, unspecified: Secondary | ICD-10-CM | POA: Diagnosis not present

## 2024-04-22 DIAGNOSIS — E1169 Type 2 diabetes mellitus with other specified complication: Secondary | ICD-10-CM | POA: Diagnosis not present

## 2024-04-22 DIAGNOSIS — I10 Essential (primary) hypertension: Secondary | ICD-10-CM | POA: Diagnosis not present

## 2024-04-22 DIAGNOSIS — M86171 Other acute osteomyelitis, right ankle and foot: Secondary | ICD-10-CM | POA: Diagnosis not present

## 2024-04-22 DIAGNOSIS — L089 Local infection of the skin and subcutaneous tissue, unspecified: Secondary | ICD-10-CM | POA: Diagnosis not present

## 2024-04-22 DIAGNOSIS — E11628 Type 2 diabetes mellitus with other skin complications: Secondary | ICD-10-CM | POA: Diagnosis not present

## 2024-04-22 DIAGNOSIS — I480 Paroxysmal atrial fibrillation: Secondary | ICD-10-CM

## 2024-04-22 HISTORY — PX: AMPUTATION TOE: SHX6595

## 2024-04-22 LAB — BASIC METABOLIC PANEL WITH GFR
Anion gap: 8 (ref 5–15)
BUN: 7 mg/dL — ABNORMAL LOW (ref 8–23)
CO2: 23 mmol/L (ref 22–32)
Calcium: 8.3 mg/dL — ABNORMAL LOW (ref 8.9–10.3)
Chloride: 107 mmol/L (ref 98–111)
Creatinine, Ser: 0.77 mg/dL (ref 0.44–1.00)
GFR, Estimated: >60 mL/min (ref >60)
Glucose, Bld: 94 mg/dL (ref 70–99)
Potassium: 3.6 mmol/L (ref 3.5–5.1)
Sodium: 138 mmol/L (ref 135–145)

## 2024-04-22 LAB — CULTURE, BLOOD (ROUTINE X 2)
Culture: NO GROWTH
Culture: NO GROWTH
Special Requests: ADEQUATE
Special Requests: ADEQUATE

## 2024-04-22 LAB — CBC
HCT: 34.4 % — ABNORMAL LOW (ref 36.0–46.0)
Hemoglobin: 11.6 g/dL — ABNORMAL LOW (ref 12.0–15.0)
MCH: 27 pg (ref 26.0–34.0)
MCHC: 33.7 g/dL (ref 30.0–36.0)
MCV: 80.2 fL (ref 80.0–100.0)
Platelets: 218 K/uL (ref 150–400)
RBC: 4.29 MIL/uL (ref 3.87–5.11)
RDW: 15 % (ref 11.5–15.5)
WBC: 5.4 K/uL (ref 4.0–10.5)
nRBC: 0 % (ref 0.0–0.2)

## 2024-04-22 LAB — GLUCOSE, CAPILLARY
Glucose-Capillary: 89 mg/dL (ref 70–99)
Glucose-Capillary: 93 mg/dL (ref 70–99)
Glucose-Capillary: 89 mg/dL (ref 70–99)
Glucose-Capillary: 83 mg/dL (ref 70–99)
Glucose-Capillary: 154 mg/dL — ABNORMAL HIGH (ref 70–99)
Glucose-Capillary: 114 mg/dL — ABNORMAL HIGH (ref 70–99)

## 2024-04-22 SURGERY — AMPUTATION, TOE
Anesthesia: Monitor Anesthesia Care | Site: Toe | Laterality: Right

## 2024-04-22 MED ORDER — FENTANYL CITRATE (PF) 250 MCG/5ML IJ SOLN
INTRAMUSCULAR | Status: AC
  Filled 2024-04-22: qty 5

## 2024-04-22 MED ORDER — SENNOSIDES-DOCUSATE SODIUM 8.6-50 MG PO TABS: 1.0000 | ORAL_TABLET | Freq: Every evening | ORAL | Status: DC | PRN

## 2024-04-22 MED ORDER — PROPOFOL 1000 MG/100ML IV EMUL
INTRAVENOUS | Status: AC
  Filled 2024-04-22: qty 100

## 2024-04-22 MED ORDER — GLUCAGON HCL RDNA (DIAGNOSTIC) 1 MG IJ SOLR: 1.0000 mg | INTRAMUSCULAR | Status: DC | PRN

## 2024-04-22 MED ORDER — GUAIFENESIN 100 MG/5ML PO LIQD: 5.0000 mL | ORAL | Status: DC | PRN

## 2024-04-22 MED ORDER — BUPIVACAINE HCL (PF) 0.5 % IJ SOLN
INTRAMUSCULAR | Status: AC
  Filled 2024-04-22: qty 30

## 2024-04-22 MED ORDER — LIDOCAINE HCL (PF) 1 % IJ SOLN
INTRAMUSCULAR | Status: DC | PRN
  Administered 2024-04-22: 5 mL

## 2024-04-22 MED ORDER — LACTATED RINGERS IV SOLN: INTRAVENOUS | Status: DC

## 2024-04-22 MED ORDER — LIDOCAINE HCL (PF) 1 % IJ SOLN
INTRAMUSCULAR | Status: AC
  Filled 2024-04-22: qty 30

## 2024-04-22 MED ORDER — ORAL CARE MOUTH RINSE: 15.0000 mL | Freq: Once | OROMUCOSAL | Status: AC

## 2024-04-22 MED ORDER — BUPIVACAINE HCL (PF) 0.5 % IJ SOLN
INTRAMUSCULAR | Status: DC | PRN
  Administered 2024-04-22: 5 mL

## 2024-04-22 MED ORDER — LIDOCAINE HCL (CARDIAC) PF 100 MG/5ML IV SOSY
PREFILLED_SYRINGE | INTRAVENOUS | Status: DC | PRN
  Administered 2024-04-22: 40 mg via INTRAVENOUS

## 2024-04-22 MED ORDER — METOPROLOL TARTRATE 5 MG/5ML IV SOLN: 5.0000 mg | INTRAVENOUS | Status: DC | PRN

## 2024-04-22 MED ORDER — INSULIN ASPART 100 UNIT/ML IJ SOLN: 0.0000 [IU] | INTRAMUSCULAR | Status: DC | PRN

## 2024-04-22 MED ORDER — SODIUM CHLORIDE 0.9 % IR SOLN
Status: DC | PRN
  Administered 2024-04-22: 1000 mL

## 2024-04-22 MED ORDER — HYDRALAZINE HCL 20 MG/ML IJ SOLN: 10.0000 mg | INTRAMUSCULAR | Status: DC | PRN

## 2024-04-22 MED ORDER — PROPOFOL 500 MG/50ML IV EMUL
INTRAVENOUS | Status: DC | PRN
  Administered 2024-04-22: 35 ug/kg/min via INTRAVENOUS
  Administered 2024-04-22: 20 mg via INTRAVENOUS
  Administered 2024-04-22: 10 mg via INTRAVENOUS

## 2024-04-22 MED ORDER — FENTANYL CITRATE (PF) 250 MCG/5ML IJ SOLN
INTRAMUSCULAR | Status: DC | PRN
  Administered 2024-04-22: 50 ug via INTRAVENOUS

## 2024-04-22 MED ORDER — CHLORHEXIDINE GLUCONATE 0.12 % MT SOLN: 15.0000 mL | Freq: Once | OROMUCOSAL | Status: AC

## 2024-04-22 MED ORDER — CHLORHEXIDINE GLUCONATE 0.12 % MT SOLN
OROMUCOSAL | Status: AC
  Administered 2024-04-22: 15 mL via OROMUCOSAL
  Filled 2024-04-22: qty 15

## 2024-04-22 MED ORDER — CLINDAMYCIN HCL 300 MG PO CAPS
300.0000 mg | ORAL_CAPSULE | Freq: Three times a day (TID) | ORAL | Status: DC
Start: 2024-04-22 — End: 2024-04-27
  Administered 2024-04-22 – 2024-04-23 (×3): 300 mg via ORAL
  Filled 2024-04-22 (×5): qty 1

## 2024-04-22 MED ORDER — LIDOCAINE-EPINEPHRINE (PF) 1 %-1:200000 IJ SOLN
INTRAMUSCULAR | Status: AC
  Filled 2024-04-22: qty 30

## 2024-04-22 SURGICAL SUPPLY — 38 items
BLADE AVERAGE 25X9 (BLADE) IMPLANT
BLADE SURG 10 STRL SS (BLADE) ×1 IMPLANT
BLADE SURG 15 STRL LF DISP TIS (BLADE) ×1 IMPLANT
BNDG COHESIVE 3X5 TAN ST LF (GAUZE/BANDAGES/DRESSINGS) ×1 IMPLANT
BNDG COMPR ESMARK 4X3 LF (GAUZE/BANDAGES/DRESSINGS) ×1 IMPLANT
BNDG ELASTIC 3INX 5YD STR LF (GAUZE/BANDAGES/DRESSINGS) ×1 IMPLANT
BNDG ELASTIC 4INX 5YD STR LF (GAUZE/BANDAGES/DRESSINGS) IMPLANT
BNDG GAUZE DERMACEA FLUFF 4 (GAUZE/BANDAGES/DRESSINGS) IMPLANT
CHLORAPREP W/TINT 26 (MISCELLANEOUS) IMPLANT
DRSG ADAPTIC 3X8 NADH LF (GAUZE/BANDAGES/DRESSINGS) IMPLANT
DRSG XEROFORM 1X8 (GAUZE/BANDAGES/DRESSINGS) IMPLANT
ELECTRODE REM PT RTRN 9FT ADLT (ELECTROSURGICAL) ×1 IMPLANT
GAUZE PAD ABD 8X10 STRL (GAUZE/BANDAGES/DRESSINGS) IMPLANT
GAUZE SPONGE 2X2 STRL 8-PLY (GAUZE/BANDAGES/DRESSINGS) IMPLANT
GAUZE SPONGE 4X4 12PLY STRL (GAUZE/BANDAGES/DRESSINGS) ×1 IMPLANT
GAUZE STRETCH 2X75IN STRL (MISCELLANEOUS) ×1 IMPLANT
GAUZE XEROFORM 1X8 LF (GAUZE/BANDAGES/DRESSINGS) ×1 IMPLANT
GLOVE BIO SURGEON STRL SZ7.5 (GLOVE) ×1 IMPLANT
GLOVE BIOGEL PI IND STRL 7.5 (GLOVE) ×1 IMPLANT
GOWN STRL REUS W/ TWL LRG LVL3 (GOWN DISPOSABLE) ×2 IMPLANT
KIT BASIN OR (CUSTOM PROCEDURE TRAY) ×1 IMPLANT
NDL HYPO 25X1 1.5 SAFETY (NEEDLE) ×1 IMPLANT
NEEDLE HYPO 25X1 1.5 SAFETY (NEEDLE) ×1 IMPLANT
PACK ORTHO EXTREMITY (CUSTOM PROCEDURE TRAY) ×1 IMPLANT
PADDING CAST ABS COTTON 4X4 ST (CAST SUPPLIES) ×2 IMPLANT
SET HNDPC FAN SPRY TIP SCT (DISPOSABLE) IMPLANT
SOLN STERILE WATER 1000 ML (IV SOLUTION) ×1 IMPLANT
SOLN STERILE WATER BTL 1000 ML (IV SOLUTION) ×1 IMPLANT
SPIKE FLUID TRANSFER (MISCELLANEOUS) IMPLANT
STOCKINETTE 4X48 STRL (DRAPES) IMPLANT
SUT ETHILON 3 0 FSLX (SUTURE) IMPLANT
SUT PROLENE 3 0 PS 2 (SUTURE) IMPLANT
SUT PROLENE 3 0 SH DA (SUTURE) IMPLANT
SUT PROLENE 4 0 PS 2 18 (SUTURE) IMPLANT
SYR CONTROL 10ML LL (SYRINGE) ×1 IMPLANT
TUBE CONNECTING 12X1/4 (SUCTIONS) IMPLANT
UNDERPAD 30X36 HEAVY ABSORB (UNDERPADS AND DIAPERS) ×1 IMPLANT
YANKAUER SUCT BULB TIP NO VENT (SUCTIONS) IMPLANT

## 2024-04-22 NOTE — Evaluation (Addendum)
 Occupational Therapy Evaluation Patient Details Name: [name] MRN: [id number] DOB: Nov 09, 1945 Today's Date: 04/22/2024   History of Present Illness   Pt is a 78 y.o female admitted 10/3 for R toe wound.S/p bil LE angiogram 10/6. Scheduled R 3rd toe amputation 10/8. PMH: DM, breast cancer, DVT, PE, HTN, HLD, OSA     Clinical Impressions Pt admitted based on above, and was seen based on problem list below. PTA pt was independent with ADLs and IADLs. Today pt is requiring set up  to min assist for ADLs. Bed mobility and functional transfers are  s for safety. Pt primarily limited by decreased standing balance.  Anticipate pt will progress well no follow up OT or DME needs. OT will continue to follow acutely to maximize functional independence.     If plan is discharge home, recommend the following:   A little help with walking and/or transfers;A lot of help with bathing/dressing/bathroom;Assistance with cooking/housework     Functional Status Assessment   Patient has had a recent decline in their functional status and demonstrates the ability to make significant improvements in function in a reasonable and predictable amount of time.     Equipment Recommendations   None recommended by OT      Precautions/Restrictions   Precautions Precautions: Fall Recall of Precautions/Restrictions: Intact Restrictions Weight Bearing Restrictions Per Provider Order: Yes RLE Weight Bearing Per Provider Order: Weight bearing as tolerated Other Position/Activity Restrictions: Post-op shoe     Mobility Bed Mobility Overal bed mobility: Modified Independent             General bed mobility comments: Increased time, HOB elevated    Transfers Overall transfer level: Needs assistance Equipment used: None Transfers: Sit to/from Stand, Bed to chair/wheelchair/BSC Sit to Stand: Supervision     Step pivot transfers: Supervision     General transfer comment: S for safety  with short step pivot, good ability to power up      Balance Overall balance assessment: Needs assistance Sitting-balance support: No upper extremity supported, Feet supported Sitting balance-Leahy Scale: Good     Standing balance support: No upper extremity supported, During functional activity Standing balance-Leahy Scale: Fair     ADL either performed or assessed with clinical judgement   ADL Overall ADL's : Needs assistance/impaired Eating/Feeding: Set up;Sitting   Grooming: Supervision/safety;Standing           Upper Body Dressing : Set up;Sitting   Lower Body Dressing: Sit to/from stand;Minimal assistance Lower Body Dressing Details (indicate cue type and reason): Assist for post-op shoe, donned briefs at supervision level Toilet Transfer: Supervision/safety;Stand-pivot Toilet Transfer Details (indicate cue type and reason): No AD Toileting- Clothing Manipulation and Hygiene: Supervision/safety;Sit to/from stand       Functional mobility during ADLs: Supervision/safety General ADL Comments: Pt with fair standing balance for ADLs     Vision Baseline Vision/History: 0 No visual deficits Vision Assessment?: No apparent visual deficits            Pertinent Vitals/Pain Pain Assessment Pain Assessment: No/denies pain (Even with mobility denies pain)     Extremity/Trunk Assessment Upper Extremity Assessment Upper Extremity Assessment: Overall WFL for tasks assessed   Lower Extremity Assessment Lower Extremity Assessment: Defer to PT evaluation   Cervical / Trunk Assessment Cervical / Trunk Assessment: Normal   Communication Communication Communication: No apparent difficulties   Cognition Arousal: Alert Behavior During Therapy: WFL for tasks assessed/performed Cognition: No apparent impairments     Following commands: Intact  Cueing  General Comments   Cueing Techniques: Verbal cues  VSS on RA          Home Living Family/patient  expects to be discharged to:: Private residence Living Arrangements: Alone Available Help at Discharge: Family;Available PRN/intermittently Type of Home: House Home Access: Stairs to enter [employer] of Steps: 3 Entrance Stairs-Rails: Can reach both Home Layout: One level     Bathroom Shower/Tub: Other (comment) (Walk in tub combo)   Bathroom Toilet: Standard     Home Equipment: Cane - Programmer, applications (2 wheels);Shower seat          Prior Functioning/Environment Prior Level of Function : Independent/Modified Independent     Mobility Comments: Use of SPC in community, no AD at home ADLs Comments: Ind    OT Problem List: Decreased strength;Impaired balance (sitting and/or standing);Decreased activity tolerance;Decreased knowledge of use of DME or AE;Cardiopulmonary status limiting activity   OT Treatment/Interventions: Self-care/ADL training;Therapeutic exercise;Energy conservation;DME and/or AE instruction;Therapeutic activities;Patient/family education;Balance training      OT Goals(Current goals can be found in the care plan section)   Acute Rehab OT Goals Patient Stated Goal: To go home OT Goal Formulation: With patient Time For Goal Achievement: 05/06/24 Potential to Achieve Goals: Good   OT Frequency:  Min 2X/week       AM-PAC OT 6 Clicks Daily Activity     Outcome Measure Help from another person eating meals?: None Help from another person taking care of personal grooming?: A Little Help from another person toileting, which includes using toliet, bedpan, or urinal?: A Little Help from another person bathing (including washing, rinsing, drying)?: A Little Help from another person to put on and taking off regular upper body clothing?: A Little Help from another person to put on and taking off regular lower body clothing?: A Little 6 Click Score: 19   End of Session Equipment Utilized During Treatment: Other (comment) (Post-op shoe) Nurse  Communication: Mobility status  Activity Tolerance: Patient tolerated treatment well Patient left: in chair;with call bell/phone within reach  OT Visit Diagnosis: Unsteadiness on feet (R26.81);Other abnormalities of gait and mobility (R26.89);Muscle weakness (generalized) (M62.81)                Time: 1352-1410 OT Time Calculation (min): 18 min Charges:  OT General Charges $OT Visit: 1 Visit OT Evaluation $OT Eval Moderate Complexity: 1 Mod  Kaimen Peine C, OT  Acute Rehabilitation Services Office [phone] Secure chat preferred   Adrianne GORMAN Savers 04/22/2024, 3:22 PM

## 2024-04-22 NOTE — Progress Notes (Signed)
Patient back from procedure.

## 2024-04-22 NOTE — Progress Notes (Signed)
 Orthopedic Tech Progress Note Patient Details:  [name] 1945-08-12 [id number]  Ortho Devices Type of Ortho Device: Postop shoe/boot Ortho Device/Splint Location: RLE Ortho Device/Splint Interventions: Orderedfitted patient with medium POST OP SHOE, she was resting peacefully when I came to apply shoe   Post Interventions Patient Tolerated: Well Instructions Provided: Care of device  Delanna LITTIE Pac 04/22/2024, 11:37 AM

## 2024-04-22 NOTE — Hospital Course (Addendum)
 Brief Narrative:   78 y.o. female with medical history significant of type 2 diabetes, atrial flutter on Xarelto , DCIS of left breast, Graves' disease, GERD, PE and DVT, hypertension, hyperlipidemia, obesity, obstructive sleep apnea on CPAP, neuropathy, pulmonary hypertension, sick sinus syndrome admitted with complaints of worsening wound increasing pain to the right second toe.  She noticed a blister on her right third toe 3 weeks ago and she was seen by podiatry was placed on antibiotics without any improvement.  She was given clindamycin  and then changed to doxycycline .  In the ED she had a right foot x-ray with no fracture or dislocation.  She was given meropenem and Zyvox and a fluid bolus.   Underwent bilateral lower extremity aortogram showing patent aorto and had a blood vessel not requiring any further revascularization.  MRI consistent with osteomyelitis.  Podiatry recommending amputation. Patient underwent amputation on 10/8 and doing well postoperatively.  Podiatry recommending 5 days of p.o. clindamycin  along with outpatient follow-up.  Assessment & Plan:   Right 3rd toe osteomyelitis:  -Seen by podiatry, status post amputation 10/8 plans for 5 days of clindamycin  upon discharge.  Surgical cultures remain negative   PAD: s/p aortogram, BL LE angiogram showing patent aorta and bilateral renals, iliac systems, CFA, SFA, profunda, popliteal and 3 vessel runoff.  No further need for revascularization.  Will plan on resuming Xarelto    NIDT2DM: Well-controlled with HbA1c 5.6%.  - Resume home regimen   Graves' disease: TSH 4.05 February 2024.  - Continue methimazole     OSA:  - CPAP qHS   HTN:  - Continue ARB   Asthma: Quiescent.  - No changes to home regimen planned.    Diabetic neuropathy:  - Continue gabapentin   Hypomagnesemia - Repletion   DVT prophylaxis: SCD's Start: 04/20/24 1244 enoxaparin (LOVENOX) injection 40 mg Start: 04/17/24 1800      Code Status: Full  Code Family Communication:   Status is: Inpatient Remains inpatient appropriate because: Hopefully discharge today   PT Follow up Recs: No Pt Follow Up10/02/2024 1600  Subjective: Feeling well no complaints.  Examination:  General exam: Appears calm and comfortable  Respiratory system: Clear to auscultation. Respiratory effort normal. Cardiovascular system: S1 & S2 heard, RRR. No JVD, murmurs, rubs, gallops or clicks. No pedal edema. Gastrointestinal system: Abdomen is nondistended, soft and nontender. No organomegaly or masses felt. Normal bowel sounds heard. Central nervous system: Alert and oriented. No focal neurological deficits. Extremities: Symmetric 5 x 5 power. Skin: Right foot dressing noted along with surgical boot in place Psychiatry: Judgement and insight appear normal. Mood & affect appropriate.

## 2024-04-22 NOTE — Evaluation (Signed)
 Physical Therapy Evaluation Patient Details Name: [name] MRN: [id number] DOB: 06/05/1946 Today's Date: 04/22/2024  History of Present Illness  Pt is a 78 y.o female admitted 10/3 for R toe wound. Pt underwent bil LE angiogram 10/6. Pt s/p R 3rd toe amputation at MPJ level 10/8. PMH: T2DM, breast cancer, DVT, PE, HTN, HLD, and OSA.  Clinical Impression  Pt admitted with above diagnosis. PTA, pt was independent for household ambulation, modI for community ambulation using hurrycane, and independent with ADLs/IADLs. She lives alone in a one story house with 3 STE. Pt currently with functional limitations due to the deficits listed below (see PT Problem List). She performed bed mobility with modI, transfers with supervision, and gait using hurrycane with CGA. Pt ambulated within her room with and without AD. She reached outside her BOS without LOB. Pt ambulated up/down the hallway using a step-through gait pattern. Educated pt on stair training in accordance with her home set-up. She ascended with LLE and descended with RLE using BUE support on railings and CGA for safety/stability. Pt will benefit from acute skilled PT to increase her independence and safety with mobility to allow discharge. Anticipate pt to progress well with no follow-up PT needs.     If plan is discharge home, recommend the following: A little help with walking and/or transfers;A little help with bathing/dressing/bathroom;Assistance with cooking/housework;Assist for transportation;Help with stairs or ramp for entrance   Can travel by private vehicle        Equipment Recommendations None recommended by PT  Recommendations for Other Services       Functional Status Assessment Patient has had a recent decline in their functional status and demonstrates the ability to make significant improvements in function in a reasonable and predictable amount of time.     Precautions / Restrictions Precautions Precautions:  Fall Recall of Precautions/Restrictions: Intact Restrictions Weight Bearing Restrictions Per Provider Order: Yes RLE Weight Bearing Per Provider Order: Weight bearing as tolerated Other Position/Activity Restrictions: Post-op shoe      Mobility  Bed Mobility Overal bed mobility: Modified Independent             General bed mobility comments: Pt completed supine<>sit with HOB elevated ~20deg and increased time.    Transfers Overall transfer level: Needs assistance Equipment used: None, Straight cane Transfers: Sit to/from Stand, Bed to chair/wheelchair/BSC Sit to Stand: Supervision   Step pivot transfers: Supervision       General transfer comment: Pt stood from lowest bed height. She pushed up with BUE support from bed and transitioned to using SPC in LUE. Pt transferred around FOB and back with and without AD. She bent down to get her shoe out of bag in closet and returned her personal items easily. Good eccentric control with sitting.    Ambulation/Gait Ambulation/Gait assistance: Contact guard assist Gait Distance (Feet): 200 Feet Assistive device: Straight cane Gait Pattern/deviations: Step-through pattern, Decreased stride length Gait velocity: decreased Gait velocity interpretation: <1.8 ft/sec, indicate of risk for recurrent falls   General Gait Details: Pt ambulated with a reciprocal gait pattern. She advanced hurry cane in LUE at the same time as RLE. Pt manuevered within room/hallway well, no LOB. Even weight shift and good foot clearence.  Stairs Stairs: Yes Stairs assistance: Contact guard assist Stair Management: Two rails, Forwards, Step to pattern Number of Stairs: 4 General stair comments: Instructed pt to ascend with LLE and descend with RLE. She took one step at a time and maintained BUE support on railings.  CGA for safety/stability.  Wheelchair Mobility     Tilt Bed    Modified Rankin (Stroke Patients Only)       Balance Overall balance  assessment: Needs assistance Sitting-balance support: No upper extremity supported, Feet supported Sitting balance-Leahy Scale: Good     Standing balance support: During functional activity, No upper extremity supported, Single extremity supported Standing balance-Leahy Scale: Fair                               Pertinent Vitals/Pain Pain Assessment Pain Assessment: No/denies pain    Home Living Family/patient expects to be discharged to:: Private residence Living Arrangements: Alone Available Help at Discharge: Family;Neighbor;Available PRN/intermittently Type of Home: House Home Access: Stairs to enter Entrance Stairs-Rails: Can reach both Entrance Stairs-Number of Steps: 3   Home Layout: One level Home Equipment: Agricultural consultant (2 wheels);Shower seat;Other (comment) (hurrycane)      Prior Function Prior Level of Function : Independent/Modified Independent;Driving             Mobility Comments: Use of SPC in community, no AD at home. Denies fall history. ADLs Comments: Indep with ADLs/IADLs     Extremity/Trunk Assessment   Upper Extremity Assessment Upper Extremity Assessment: Defer to OT evaluation    Lower Extremity Assessment Lower Extremity Assessment: Overall WFL for tasks assessed;RLE deficits/detail RLE Deficits / Details: Pt POD 0 s/p third toe amputation. She is in a dressing around her foot and wearing post-op shoe. Hip and Knee AROM and strength WFL. Ankle/foot AROM and strength decreased. RLE Sensation: decreased proprioception RLE Coordination: decreased gross motor    Cervical / Trunk Assessment Cervical / Trunk Assessment: Normal  Communication   Communication Communication: No apparent difficulties    Cognition Arousal: Alert Behavior During Therapy: WFL for tasks assessed/performed   PT - Cognitive impairments: No apparent impairments                       PT - Cognition Comments: Pt A,Ox4 Following commands:  Intact       Cueing Cueing Techniques: Verbal cues     General Comments General comments (skin integrity, edema, etc.): VSS on RA    Exercises     Assessment/Plan    PT Assessment Patient needs continued PT services  PT Problem List Decreased activity tolerance;Decreased balance;Decreased mobility       PT Treatment Interventions DME instruction;Gait training;Stair training;Functional mobility training;Therapeutic activities;Therapeutic exercise;Balance training;Patient/family education    PT Goals (Current goals can be found in the Care Plan section)  Acute Rehab PT Goals Patient Stated Goal: Return Home PT Goal Formulation: With patient Time For Goal Achievement: 05/06/24 Potential to Achieve Goals: Good    Frequency Min 2X/week     Co-evaluation               AM-PAC PT 6 Clicks Mobility  Outcome Measure Help needed turning from your back to your side while in a flat bed without using bedrails?: None Help needed moving from lying on your back to sitting on the side of a flat bed without using bedrails?: None Help needed moving to and from a bed to a chair (including a wheelchair)?: A Little Help needed standing up from a chair using your arms (e.g., wheelchair or bedside chair)?: A Little Help needed to walk in hospital room?: A Little Help needed climbing 3-5 steps with a railing? : A Little 6 Click Score: 20  End of Session Equipment Utilized During Treatment: Gait belt Activity Tolerance: Patient tolerated treatment well Patient left: in bed;with call bell/phone within reach Nurse Communication: Mobility status PT Visit Diagnosis: Difficulty in walking, not elsewhere classified (R26.2);Unsteadiness on feet (R26.81)    Time: 8398-8374 PT Time Calculation (min) (ACUTE ONLY): 24 min   Charges:   PT Evaluation $PT Eval Low Complexity: 1 Low PT Treatments $Gait Training: 8-22 mins PT General Charges $$ ACUTE PT VISIT: 1 Visit         Randall SAUNDERS, PT, DPT Acute Rehabilitation Services Office: [phone] Secure Chat Preferred  Delon CHRISTELLA Callander 04/22/2024, 4:54 PM

## 2024-04-22 NOTE — Op Note (Addendum)
 Full Operative Report  Date of Operation: 10:24 AM, 04/22/2024   Patient: [name] - 78 y.o. female  Surgeon: Malvin Marsa [facility], DPM   Assistant: None  Diagnosis: osteomyelitis of right third toe  Procedure:  1. Right third toe amputation at MPJ level    Anesthesia: Monitor Anesthesia Care  Waddell Lauraine NOVAK, MD  Anesthesiologist: Waddell Lauraine NOVAK, MD CRNA: Cindie Donald CROME, CRNA   Estimated Blood Loss: Minimal   Hemostasis: 1) Anatomical dissection, mechanical compression, electrocautery 2) no tourniquet was used during procedure  Implants: * No implants in log *  Materials: prolene 3-0  Injectables: 1) Pre-operatively: 10 cc of 50:50 mixture 1%lidocaine  plain and 0.5% marcaine  plain 2) Post-operatively: None   Specimens: - Pathology: right third toe for pathology - Microbiology: culture right third toe    Antibiotics: IV antibiotics given per schedule on the floor  Drains: None  Complications: Patient tolerated the procedure well without complication.   Operative findings: As below in detailed report  Indications for Procedure: [name] presents to [facility], [place] with a chief complaint of right third toe ulceration with concern for underlying osteomyelitis on mri.  The patient has failed conservative treatments of various modalities. At this time the patient has elected to proceed with surgical correction. All alternatives, risks, and complications of the procedures were thoroughly explained to the patient. Patient exhibits appropriate understanding of all discussion points and informed consent was signed and obtained in the chart with no guarantees to surgical outcome given or implied.  Description of Procedure: Patient was brought to the operating room. Patient remained on their hospital bed in the supine position. A surgical timeout was performed and all members of the operating room, the procedure, and the surgical site were  identified. anesthesia occurred as per anesthesia record. Local anesthetic as previously described was then injected about the operative field in a local infiltrative block.  The operative lower extremity as noted above was then prepped and draped in the usual sterile manner. The following procedure then began.  Attention was directed to the third digit on the RIGHT foot. A full-thickness incision encompassing the entire digit was made using a #15 blade. Dissection was carried down to bone. The toe was secured with a towel clamp, further dissected in its entirety, and disarticulated at the MPJ and passed to the back table as a gross specimen. This was then labled and sent to pathology. The bone was noted to be soft and eroded, and consistent with osteomyelitis. A bone culture was harvested with rongeur from the PIPJ which was necrotic. All remaining necrotic and devitalized soft tissue structures were visualized and dissected away using sharp and dull dissection. Care was taken to protect all neurovascular structures throughout the dissection. All bleeders were cauterized as necessary.  The area was then flushed with copious amounts of sterile saline. Then using the suture materials previously described, the site was closed in anatomic layers and the skin was well approximated under minimal tension.  The surgical site was then dressed with xerform 4x4 gauze kerlix and ace wrap. The patient tolerated both the procedure and anesthesia well with vital signs stable throughout. The patient was transferred in good condition and all vital signs stable  from the OR to recovery under the discretion of anesthesia.  Condition: Vital signs stable, neurovascular status unchanged from preoperative   Surgical plan:  Expect clean margin, good bleeding seen. 5 days clindamycin  on dc, stable for dc home tmrw, leave dressing  c/d/I, follow up next week Tuesday or Thursday.   The patient will be WBAT in a post op shoe to the  operative limb until further instructed. The dressing is to remain clean, dry, and intact. Will continue to follow unless noted elsewhere.   Marsa Honour, DPM [facility]

## 2024-04-22 NOTE — Progress Notes (Signed)
 PT Cancellation Note  Patient Details Name: [name] MRN: [id number] DOB: [birth year]   Cancelled Treatment:    Reason Eval/Treat Not Completed: Patient at procedure or test/unavailable (Pt in surgery for toe amputation. Will check back as able.)   [name] Bevel 04/22/2024, 9:03 AM [name],PT Acute Rehab Services [phone]

## 2024-04-22 NOTE — Anesthesia Preprocedure Evaluation (Addendum)
 Anesthesia Evaluation  Patient identified by MRN, date of birth, ID band Patient awake    Reviewed: Allergy & Precautions, NPO status , Patient's Chart, lab work & pertinent test results  History of Anesthesia Complications Negative for: history of anesthetic complications  Airway Mallampati: I       Dental  (+) Teeth Intact, Dental Advisory Given   Pulmonary asthma , sleep apnea and Continuous Positive Airway Pressure Ventilation , COPD, PE   breath sounds clear to auscultation       Cardiovascular hypertension, pulmonary hypertension+ DVT  + dysrhythmias Atrial Fibrillation + Valvular Problems/Murmurs  Rhythm:Regular Rate:Normal  TTE (2022):  1. Rhythm appears to be atrial flutter with variable conduction. Left  ventricular ejection fraction, by estimation, is 60 to 65%. The left  ventricle has normal function. The left ventricle has no regional wall  motion abnormalities. There is moderate asymmetric left ventricular hypertrophy of the basal-septal segment. Left ventricular diastolic parameters are indeterminate.   2. Right ventricular systolic function is normal. The right ventricular size is normal. There is mildly elevated pulmonary artery systolic pressure. The estimated right ventricular systolic pressure is 44.0 mmHg.   3. Left atrial size was mildly dilated.   4. Right atrial size was moderately dilated.   5. The mitral valve is normal in structure. Trivial mitral valve regurgitation. No evidence of mitral stenosis.   6. Tricuspid valve regurgitation is moderate.   7. The aortic valve is tricuspid. Aortic valve regurgitation is not  visualized. No aortic stenosis is present.   8. The inferior vena cava is normal in size with greater than 50%  respiratory variability, suggesting right atrial pressure of 3 mmHg.     Neuro/Psych    GI/Hepatic ,GERD  ,,  Endo/Other  diabetes, Type 2 Hyperthyroidism   Renal/GU       Musculoskeletal   Abdominal   Peds  Hematology  (+) Blood dyscrasia, anemia   Anesthesia Other Findings   Reproductive/Obstetrics                              Anesthesia Physical Anesthesia Plan  ASA: 3  Anesthesia Plan: MAC   Post-op Pain Management:    Induction: Intravenous  PONV Risk Score and Plan: 2  Airway Management Planned: Simple Face Mask and Natural Airway  Additional Equipment:   Intra-op Plan:   Post-operative Plan: Extubation in OR  Informed Consent:      Dental advisory given  Plan Discussed with: CRNA  Anesthesia Plan Comments:          Anesthesia Quick Evaluation

## 2024-04-22 NOTE — Anesthesia Postprocedure Evaluation (Signed)
 Anesthesia Post Note  Patient: [name]  Procedure(s) Performed: AMPUTATION, TOE (Right: Toe)     Patient location during evaluation: PACU Anesthesia Type: MAC Level of consciousness: awake Pain management: pain level controlled Vital Signs Assessment: post-procedure vital signs reviewed and stable Respiratory status: spontaneous breathing Cardiovascular status: blood pressure returned to baseline Postop Assessment: no apparent nausea or vomiting Anesthetic complications: no   No notable events documented.                Lauraine KATHEE [place]

## 2024-04-22 NOTE — Progress Notes (Signed)
 PROGRESS NOTE    [name]  FMW:[id number] DOB: Oct 25, 1945 DOA: 04/17/2024 PCP: Avelina Greig BRAVO, MD    Brief Narrative:   78 y.o. female with medical history significant of type 2 diabetes, atrial flutter on Xarelto , DCIS of left breast, Graves' disease, GERD, PE and DVT, hypertension, hyperlipidemia, obesity, obstructive sleep apnea on CPAP, neuropathy, pulmonary hypertension, sick sinus syndrome admitted with complaints of worsening wound increasing pain to the right second toe.  She noticed a blister on her right third toe 3 weeks ago and she was seen by podiatry was placed on antibiotics without any improvement.  She was given clindamycin  and then changed to doxycycline .  In the ED she had a right foot x-ray with no fracture or dislocation.  She was given meropenem and Zyvox and a fluid bolus.   Underwent bilateral lower extremity aortogram showing patent aorto and had a blood vessel not requiring any further revascularization.  MRI consistent with osteomyelitis.  Podiatry recommending amputation.  Assessment & Plan:   Right 3rd toe osteomyelitis:  -Seen by podiatry, status post amputation today.  Plans for 5 days of clindamycin  upon discharge   PAD: s/p aortogram, BL LE angiogram showing patent aorta and bilateral renals, iliac systems, CFA, SFA, profunda, popliteal and 3 vessel runoff  - Xarelto  on hold for surgery.  - No further revascularization required at this time.   NIDT2DM: Well-controlled with HbA1c 5.6%.  - Hold SGLT2i and GLP-1. - Continue SSI   Graves' disease: TSH 4.05 February 2024.  - Continue methimazole     OSA:  - CPAP qHS   HTN:  - Continue ARB   Asthma: Quiescent.  - No changes to home regimen planned.    Diabetic neuropathy:  - Continue gabapentin    DVT prophylaxis: SCD's Start: 04/20/24 1244 enoxaparin (LOVENOX) injection 40 mg Start: 04/17/24 1800      Code Status: Full Code Family Communication:   Status is: Inpatient Remains inpatient  appropriate because: Routine postop management.  Hopefully discharge in next 24-48 hours   PT Follow up Recs:   Subjective: Seen postprocedure, she is drowsy as expected but no other complaints.   Examination:  General exam: Appears calm and comfortable  Respiratory system: Clear to auscultation. Respiratory effort normal. Cardiovascular system: S1 & S2 heard, RRR. No JVD, murmurs, rubs, gallops or clicks. No pedal edema. Gastrointestinal system: Abdomen is nondistended, soft and nontender. No organomegaly or masses felt. Normal bowel sounds heard. Central nervous system: Alert and oriented. No focal neurological deficits. Extremities: Symmetric 5 x 5 power. Skin: Right foot dressing noted Psychiatry: Judgement and insight appear normal. Mood & affect appropriate.                Diet Orders (From admission, onward)     Start     Ordered   04/22/24 1057  Diet regular Room service appropriate? Yes; Fluid consistency: Thin  Diet effective now       Question Answer Comment  Room service appropriate? Yes   Fluid consistency: Thin      04/22/24 1056            Objective: Vitals:   04/22/24 1100 04/22/24 1115 04/22/24 1126 04/22/24 1140  BP: (!) 161/93 (!) 157/83 (!) 176/81 (!) 171/76  Pulse: (!) 58 71 73 72  Resp: 18 (!) 21 20 19   Temp:  98.4 F (36.9 C)  97.9 F (36.6 C)  TempSrc:    Axillary  SpO2: 99% 96% 99% 98%  Weight:  Height:        Intake/Output Summary (Last 24 hours) at 04/22/2024 1335 Last data filed at 04/22/2024 1044 Gross per 24 hour  Intake 400 ml  Output 5 ml  Net 395 ml   Filed Weights   04/20/24 0416 04/22/24 0901  Weight: 90.3 kg 90.3 kg    Scheduled Meds:  acetaminophen   1,000 mg Oral BID   anastrozole   1 mg Oral Daily   atorvastatin   10 mg Oral Daily   budesonide   0.5 mg Nebulization Daily   clindamycin   300 mg Oral Q8H   enoxaparin (LOVENOX) injection  40 mg Subcutaneous Q24H   fluticasone  furoate-vilanterol  1 puff  Inhalation Daily   gabapentin   300 mg Oral BID   gabapentin   600 mg Oral QHS   insulin  aspart  0-9 Units Subcutaneous TID WC   irbesartan   150 mg Oral Daily   methimazole   5 mg Oral Daily   montelukast   10 mg Oral QHS   pantoprazole   40 mg Oral BID   sodium chloride  flush  3 mL Intravenous Q12H   Continuous Infusions:  sodium chloride  100 mL/hr at 04/22/24 1013    Nutritional status     Body mass index is 35.26 kg/m.  Data Reviewed:   CBC: Recent Labs  Lab 04/17/24 1152 04/17/24 2029 04/18/24 0924 04/22/24 0331  WBC 6.0 6.5 6.5 5.4  NEUTROABS 3.8  --   --   --   HGB 12.3 11.9* 11.9* 11.6*  HCT 36.3 35.8* 35.8* 34.4*  MCV 81.0 80.8 80.8 80.2  PLT 238 246 242 218   Basic Metabolic Panel: Recent Labs  Lab 04/17/24 1152 04/17/24 2029 04/18/24 0924 04/22/24 0331  NA 144  --  143 138  K 4.0  --  4.3 3.6  CL 108  --  108 107  CO2 24  --  26 23  GLUCOSE 94  --  117* 94  BUN 11  --  8 7*  CREATININE 0.88 0.89 0.94 0.77  CALCIUM  9.7  --  9.2 8.3*   GFR: Estimated Creatinine Clearance: 62.8 mL/min (by C-G formula based on SCr of 0.77 mg/dL). Liver Function Tests: Recent Labs  Lab 04/17/24 1152 04/18/24 0924  AST 20 21  ALT 7 10  ALKPHOS 107 80  BILITOT 0.8 0.6  PROT 6.5 5.8*  ALBUMIN  4.0 3.1*   No results for input(s): LIPASE, AMYLASE in the last 168 hours. No results for input(s): AMMONIA in the last 168 hours. Coagulation Profile: Recent Labs  Lab 04/18/24 0924  INR 1.7*   Cardiac Enzymes: No results for input(s): CKTOTAL, CKMB, CKMBINDEX, TROPONINI in the last 168 hours. BNP (last 3 results) No results for input(s): PROBNP in the last 8760 hours. HbA1C: No results for input(s): HGBA1C in the last 72 hours. CBG: Recent Labs  Lab 04/21/24 2111 04/22/24 0609 04/22/24 0845 04/22/24 1056 04/22/24 1142  GLUCAP 114* 89 93 89 83   Lipid Profile: Recent Labs    04/21/24 1027  CHOL 94  HDL 53  LDLCALC 33  TRIG 39   CHOLHDL 1.8   Thyroid  Function Tests: No results for input(s): TSH, T4TOTAL, FREET4, T3FREE, THYROIDAB in the last 72 hours. Anemia Panel: No results for input(s): VITAMINB12, FOLATE, FERRITIN, TIBC, IRON, RETICCTPCT in the last 72 hours. Sepsis Labs: Recent Labs  Lab 04/17/24 1215  LATICACIDVEN 1.4  2.7*    Recent Results (from the past 240 hours)  Blood culture (routine x 2)     Status: None  Collection Time: 04/17/24 12:04 PM   Specimen: BLOOD RIGHT ARM  Result Value Ref Range Status   Specimen Description BLOOD RIGHT ARM  Final   Special Requests   Final    BOTTLES DRAWN AEROBIC AND ANAEROBIC Blood Culture adequate volume   Culture   Final    NO GROWTH 5 DAYS Performed at [facility], [address] [place], [place] [address]    Report Status 04/22/2024 FINAL  Final  Blood culture (routine x 2)     Status: None   Collection Time: 04/17/24 12:09 PM   Specimen: BLOOD LEFT HAND  Result Value Ref Range Status   Specimen Description BLOOD LEFT HAND  Final   Special Requests   Final    BOTTLES DRAWN AEROBIC AND ANAEROBIC Blood Culture adequate volume   Culture   Final    NO GROWTH 5 DAYS Performed at [facility], [address], [place], [place] [address]    Report Status 04/22/2024 FINAL  Final         Radiology Studies: DG Foot 2 Views Right Result Date: 04/22/2024 CLINICAL DATA:  Postoperative state. EXAM: RIGHT FOOT - 2 VIEW COMPARISON:  Radiograph 04/17/2024 FINDINGS: Interval resection of the third toe. The third metatarsal head is smooth. Expected postoperative changes in the soft tissues. K-wire traverses the distal fifth metatarsal as before. Ankylosis of the great toe at the interphalangeal joint and fourth toe. Probable remote surgical change of the fifth toe. No acute fracture, erosive or bony destructive change. IMPRESSION: Interval resection of the third toe. Expected postoperative changes in the soft tissues.  Electronically Signed   By: Andrea Gasman M.D.   On: 04/22/2024 12:17           LOS: 5 days   Time spent= 35 mins    Burgess JAYSON Dare, MD [facility]  If 7PM-7AM, please contact night-coverage  04/22/2024, 1:35 PM

## 2024-04-22 NOTE — Transfer of Care (Signed)
 Immediate Anesthesia Transfer of Care Note  Patient: [name]  Procedure(s) Performed: AMPUTATION, TOE (Right: Toe)  Patient Location: PACU  Anesthesia Type:MAC  Level of Consciousness: awake, alert , oriented, and patient cooperative  Airway & Oxygen Therapy: Patient Spontanous Breathing  Post-op Assessment: Report given to RN and Post -op Vital signs reviewed and stable  Post vital signs: Reviewed and stable  Last Vitals:  Vitals Value Taken Time  BP 151/95 04/22/24 10:52  Temp    Pulse 55 04/22/24 10:55  Resp 12 04/22/24 10:55  SpO2 99 % 04/22/24 10:55  Vitals shown include unfiled device data.  Last Pain:  Vitals:   04/22/24 0906  TempSrc:   PainSc: 0-No pain      Patients Stated Pain Goal: 0 (04/17/24 1051)  Complications: No notable events documented.

## 2024-04-22 NOTE — Progress Notes (Signed)
 History and Physical Interval Note:  04/22/2024 10:08 AM  [name]  has presented today for surgery, with the diagnosis of osteomyelitis right third toe.  The various methods of treatment have been discussed with the patient and family. After consideration of risks, benefits and other options for treatment, the patient has consented to   Procedure(s) with comments: AMPUTATION, TOE (Right) - RIGHT 3RD TOE AMPUTATION as a surgical intervention.  The patient's history has been reviewed, patient examined, no change in status, stable for surgery.  I have reviewed the patient's chart and labs.  Questions were answered to the patient's satisfaction.     Marsa FALCON Sherron Mapp

## 2024-04-23 ENCOUNTER — Ambulatory Visit

## 2024-04-23 ENCOUNTER — Encounter (HOSPITAL_COMMUNITY): Payer: Self-pay | Admitting: Podiatry

## 2024-04-23 ENCOUNTER — Other Ambulatory Visit (HOSPITAL_COMMUNITY): Payer: Self-pay

## 2024-04-23 DIAGNOSIS — E11628 Type 2 diabetes mellitus with other skin complications: Secondary | ICD-10-CM | POA: Diagnosis not present

## 2024-04-23 DIAGNOSIS — M86171 Other acute osteomyelitis, right ankle and foot: Secondary | ICD-10-CM | POA: Diagnosis not present

## 2024-04-23 DIAGNOSIS — L089 Local infection of the skin and subcutaneous tissue, unspecified: Secondary | ICD-10-CM | POA: Diagnosis not present

## 2024-04-23 LAB — BASIC METABOLIC PANEL WITH GFR
Anion gap: 8 (ref 5–15)
BUN: 7 mg/dL — ABNORMAL LOW (ref 8–23)
CO2: 23 mmol/L (ref 22–32)
Calcium: 8.6 mg/dL — ABNORMAL LOW (ref 8.9–10.3)
Chloride: 108 mmol/L (ref 98–111)
Creatinine, Ser: 0.76 mg/dL (ref 0.44–1.00)
GFR, Estimated: >60 mL/min (ref >60)
Glucose, Bld: 106 mg/dL — ABNORMAL HIGH (ref 70–99)
Potassium: 4.4 mmol/L (ref 3.5–5.1)
Sodium: 139 mmol/L (ref 135–145)

## 2024-04-23 LAB — CBC
HCT: 33.6 % — ABNORMAL LOW (ref 36.0–46.0)
Hemoglobin: 11.3 g/dL — ABNORMAL LOW (ref 12.0–15.0)
MCH: 27.2 pg (ref 26.0–34.0)
MCHC: 33.6 g/dL (ref 30.0–36.0)
MCV: 81 fL (ref 80.0–100.0)
Platelets: 212 K/uL (ref 150–400)
RBC: 4.15 MIL/uL (ref 3.87–5.11)
RDW: 15.3 % (ref 11.5–15.5)
WBC: 4.9 K/uL (ref 4.0–10.5)
nRBC: 0 % (ref 0.0–0.2)

## 2024-04-23 LAB — GLUCOSE, CAPILLARY
Glucose-Capillary: 89 mg/dL (ref 70–99)
Glucose-Capillary: 123 mg/dL — ABNORMAL HIGH (ref 70–99)

## 2024-04-23 LAB — MAGNESIUM: Magnesium: 1.6 mg/dL — ABNORMAL LOW (ref 1.7–2.4)

## 2024-04-23 LAB — PHOSPHORUS: Phosphorus: 2.9 mg/dL (ref 2.5–4.6)

## 2024-04-23 MED ORDER — OXYCODONE HCL 5 MG PO TABS
5.0000 mg | ORAL_TABLET | Freq: Four times a day (QID) | ORAL | 0 refills | Status: DC | PRN
  Filled 2024-04-23: qty 20, 5d supply, fill #0

## 2024-04-23 MED ORDER — MAGNESIUM SULFATE 2 GM/50ML IV SOLN
2.0000 g | Freq: Once | INTRAVENOUS | Status: DC
  Filled 2024-04-23: qty 50

## 2024-04-23 MED ORDER — MAGNESIUM OXIDE -MG SUPPLEMENT 400 (240 MG) MG PO TABS
800.0000 mg | ORAL_TABLET | Freq: Once | ORAL | Status: AC
  Administered 2024-04-23: 800 mg via ORAL
  Filled 2024-04-23: qty 2

## 2024-04-23 MED ORDER — SENNOSIDES-DOCUSATE SODIUM 8.6-50 MG PO TABS
2.0000 | ORAL_TABLET | Freq: Every evening | ORAL | 0 refills | Status: AC | PRN
  Filled 2024-04-23: qty 60, 30d supply, fill #0

## 2024-04-23 MED ORDER — CLINDAMYCIN HCL 300 MG PO CAPS
300.0000 mg | ORAL_CAPSULE | Freq: Three times a day (TID) | ORAL | 0 refills | Status: AC
  Filled 2024-04-23: qty 15, 5d supply, fill #0

## 2024-04-23 MED ORDER — RIVAROXABAN 20 MG PO TABS: 20.0000 mg | ORAL_TABLET | Freq: Every day | ORAL | Status: DC

## 2024-04-23 NOTE — Progress Notes (Signed)
  Subjective:  Patient ID: RASHELLE IRELAND, female    DOB: 08-27-1945,  MRN: [id number]  Chief Complaint  Patient presents with   Wound Infection    DOS: 04/22/2024 Procedure: Amputation of right third toe at MPJ level  78 y.o. female seen for post op check.  Patient reports she is doing well she did have some pain overnight but seems to be controlled this morning.  We discussed findings from the OR as well as plans for follow-up.  All questions were answered.  Review of Systems: Negative except as noted in the HPI. Denies N/V/F/Ch.   Objective:   Constitutional Well developed. Well nourished.  Vascular Foot warm and well perfused. Capillary refill normal to all digits.   No calf pain with palpation  Neurologic Normal speech. Oriented to person, place, and time. Epicritic sensation diminished to right foot  Dermatologic Dressing clean dry and intact right foot  Orthopedic: Status post right third toe amputation MPJ level with postop shoe in place   Radiographs: Interval resection of the third toe. Expected postoperative changes in the soft tissues.  Pathology: Right third toe pending  Micro: Tissue culture pending  Assessment:   1. Diabetic foot infection (HCC)   Osteomyelitis of right third toe status post amputation at MPJ level  Plan:  Patient was evaluated and treated and all questions answered.  POD # 1 s/p right third toe amputation MPJ level -Progressing as expected postoperatively, patient doing well this morning will be stable for discharge later today -XR: Expected postop findings -WB Status: Weightbearing as tolerated in postop shoe -Sutures: Remain intact 2 to 3 weeks. -Medications/ABX: Recommend 5 days clindamycin  on discharge due to penicillin allergy -Dressing: Leave dressing clean dry and intact until follow-up next week - F/u Plan: Patient will follow-up next week Tuesday will have office call to arrange stable for discharge from my standpoint will  sign off.        [name], DPM [facility] / [facility]

## 2024-04-23 NOTE — TOC Transition Note (Signed)
 Transition of Care (TOC) - Discharge Note Rayfield Gobble RN, BSN Inpatient Care Management Unit 4E- RN Case Manager See Treatment Team for direct phone #   Patient Details  Name: ANESHA HACKERT MRN: [id number] Date of Birth: 01-06-1946  Transition of [facility]) CM/SW Contact:  Gobble Rayfield Hurst, RN Phone Number: 04/23/2024, 11:12 AM   Clinical Narrative:    Pt stable for transition home today, no HH or DME needs noted. Family to transport home.   Pt will follow up as per AVS instructions.    Final next level of care: Home/Self Care Barriers to Discharge: No Barriers Identified   Patient Goals and CMS Choice Patient states their goals for this hospitalization and ongoing recovery are:: return home and recover   Choice offered to / list presented to : NA      Discharge Placement               Home        Discharge Plan and Services Additional resources added to the After Visit Summary for   In-house Referral: NA Discharge Planning Services: NA Post Acute Care Choice: NA          DME Arranged: N/A DME Agency: NA       HH Arranged: NA HH Agency: NA        Social Drivers of Health (SDOH) Interventions SDOH Screenings   Food Insecurity: No Food Insecurity (04/17/2024)  Housing: Unknown (04/17/2024)  Transportation Needs: No Transportation Needs (04/17/2024)  Utilities: Not At Risk (04/17/2024)  Alcohol  Screen: Low Risk  (05/16/2023)  Depression (PHQ2-9): Low Risk  (03/20/2024)  Financial Resource Strain: Low Risk  (05/16/2023)  Physical Activity: Insufficiently Active (05/16/2023)  Social Connections: Moderately Isolated (04/17/2024)  Stress: No Stress Concern Present (05/16/2023)  Tobacco Use: Low Risk  (04/22/2024)  Health Literacy: Adequate Health Literacy (05/16/2023)     Readmission Risk Interventions    04/23/2024   11:12 AM 12/19/2021    9:50 AM  Readmission Risk Prevention Plan  Transportation Screening Complete Complete  PCP or Specialist  Appt within 5-7 Days Complete   Home Care Screening Complete   Medication Review (RN CM) Complete   Medication Review (RN Care Manager)  Complete  PCP or Specialist appointment within 3-5 days of discharge  Complete  HRI or Home Care Consult  Complete  SW Recovery Care/Counseling Consult  Complete  Palliative Care Screening  Not Applicable  Skilled Nursing Facility  Not Applicable

## 2024-04-23 NOTE — Progress Notes (Signed)
 [name] to be D/C'd Home per MD order.  Discussed with the patient and all questions fully answered.  VSS, Skin clean, dry and intact without evidence of skin break down, no evidence of skin tears noted. IV catheter discontinued intact. Site without signs and symptoms of complications. Dressing and pressure applied.  An After Visit Summary was printed and given to the patient. Patient received prescription.  D/c education completed with patient/family including follow up instructions, medication list, d/c activities limitations if indicated, with other d/c instructions as indicated by MD - patient able to verbalize understanding, all questions fully answered.   Patient instructed to return to ED, call 911, or call MD for any changes in condition.   Patient escorted via WC, and D/C home via private auto.  [name] 04/23/2024 2:27 PM

## 2024-04-23 NOTE — Discharge Summary (Signed)
 Physician Discharge Summary  [name] FMW:[id number] DOB: Jan 20, 1946 DOA: 04/17/2024  PCP: Avelina Greig BRAVO, MD  Admit date: 04/17/2024 Discharge date: 04/23/2024  Admitted From: Home Disposition: Home  Recommendations for Outpatient Follow-up:  Follow up with PCP in 1-2 weeks Please obtain BMP/CBC in one week your next doctors visit.  5 days of p.o. clindamycin  Pain medication with bowel regimen Outpatient follow-up with podiatry   Discharge Condition: Stable CODE STATUS: Full code Diet recommendation: Diabetic  Brief/Interim Summary: Brief Narrative:   78 y.o. female with medical history significant of type 2 diabetes, atrial flutter on Xarelto , DCIS of left breast, Graves' disease, GERD, PE and DVT, hypertension, hyperlipidemia, obesity, obstructive sleep apnea on CPAP, neuropathy, pulmonary hypertension, sick sinus syndrome admitted with complaints of worsening wound increasing pain to the right second toe.  She noticed a blister on her right third toe 3 weeks ago and she was seen by podiatry was placed on antibiotics without any improvement.  She was given clindamycin  and then changed to doxycycline .  In the ED she had a right foot x-ray with no fracture or dislocation.  She was given meropenem and Zyvox and a fluid bolus.   Underwent bilateral lower extremity aortogram showing patent aorto and had a blood vessel not requiring any further revascularization.  MRI consistent with osteomyelitis.  Podiatry recommending amputation. Patient underwent amputation on 10/8 and doing well postoperatively.  Podiatry recommending 5 days of p.o. clindamycin  along with outpatient follow-up.  Assessment & Plan:   Right 3rd toe osteomyelitis:  -Seen by podiatry, status post amputation 10/8 plans for 5 days of clindamycin  upon discharge.  Surgical cultures remain negative   PAD: s/p aortogram, BL LE angiogram showing patent aorta and bilateral renals, iliac systems, CFA, SFA, profunda,  popliteal and 3 vessel runoff.  No further need for revascularization.  Will plan on resuming Xarelto    NIDT2DM: Well-controlled with HbA1c 5.6%.  - Resume home regimen   Graves' disease: TSH 4.05 February 2024.  - Continue methimazole     OSA:  - CPAP qHS   HTN:  - Continue ARB   Asthma: Quiescent.  - No changes to home regimen planned.    Diabetic neuropathy:  - Continue gabapentin   Hypomagnesemia - Repletion   DVT prophylaxis: SCD's Start: 04/20/24 1244 enoxaparin (LOVENOX) injection 40 mg Start: 04/17/24 1800      Code Status: Full Code Family Communication:   Status is: Inpatient Remains inpatient appropriate because: Hopefully discharge today   PT Follow up Recs: No Pt Follow Up10/02/2024 1600  Subjective: Feeling well no complaints.  Examination:  General exam: Appears calm and comfortable  Respiratory system: Clear to auscultation. Respiratory effort normal. Cardiovascular system: S1 & S2 heard, RRR. No JVD, murmurs, rubs, gallops or clicks. No pedal edema. Gastrointestinal system: Abdomen is nondistended, soft and nontender. No organomegaly or masses felt. Normal bowel sounds heard. Central nervous system: Alert and oriented. No focal neurological deficits. Extremities: Symmetric 5 x 5 power. Skin: Right foot dressing noted along with surgical boot in place Psychiatry: Judgement and insight appear normal. Mood & affect appropriate.    Discharge Diagnoses:  Principal Problem:   Diabetic foot infection (HCC) Active Problems:   Hypertension associated with diabetes (HCC)   Neuropathy due to secondary diabetes (HCC)   Moderate persistent asthma   Obstructive sleep apnea treated with continuous positive airway pressure (CPAP)   Hyperlipidemia associated with type 2 diabetes mellitus (HCC)   Type 2 diabetes mellitus with diabetic neuropathy, unspecified (HCC)  Hyperthyroidism   Diabetic foot (HCC)   Acute osteomyelitis of toe of right foot  (HCC)      Discharge Exam: Vitals:   04/23/24 0756 04/23/24 0819  BP: 134/69   Pulse: 70   Resp: 20   Temp: 97.6 F (36.4 C)   SpO2: 96% 93%   Vitals:   04/23/24 0344 04/23/24 0600 04/23/24 0756 04/23/24 0819  BP: (!) 172/83  134/69   Pulse: 70  70   Resp: 20 20 20    Temp: 97.6 F (36.4 C)  97.6 F (36.4 C)   TempSrc: Oral  Oral   SpO2: 93%  96% 93%  Weight:      Height:          Discharge Instructions   Allergies as of 04/23/2024       Reactions   Dilantin [phenytoin] Swelling   Facial swelling   Latex Hives   Penicillins Nausea And Vomiting, Swelling   Roxicodone  [oxycodone ] Nausea And Vomiting, Other (See Comments)   Abdominal pain   Codeine  Nausea Only   Tolerates Tylenol  with codeine  combination   Hydrocodone  Nausea And Vomiting   Lyrica [pregabalin] Itching, Other (See Comments)   Headaches Vision changes   Nickel Hives, Rash        Medication List     STOP taking these medications    doxycycline  100 MG tablet Commonly known as: VIBRA -TABS   metroNIDAZOLE  250 MG tablet Commonly known as: FLAGYL        TAKE these medications    acetaminophen  500 MG tablet Commonly known as: TYLENOL  Take 1,000 mg by mouth 2 (two) times daily as needed for fever or headache (pain).   Advair  HFA 230-21 MCG/ACT inhaler Generic drug: fluticasone -salmeterol Inhale 2 puffs into the lungs 2 (two) times daily. On empty lungs   albuterol  108 (90 Base) MCG/ACT inhaler Commonly known as: VENTOLIN  HFA Inhale 2 puffs into the lungs every 6 (six) hours as needed for wheezing or shortness of breath (Take with Flovent  During Flare Ups.). What changed:  when to take this additional instructions   albuterol  (2.5 MG/3ML) 0.083% nebulizer solution Commonly known as: PROVENTIL  Take 3 mLs (2.5 mg total) by nebulization every 6 (six) hours as needed for wheezing or shortness of breath. What changed: Another medication with the same name was changed. Make sure you  understand how and when to take each.   anastrozole  1 MG tablet Commonly known as: ARIMIDEX  Take 1 tablet (1 mg total) by mouth daily.   atorvastatin  10 MG tablet Commonly known as: LIPITOR TAKE 1 TABLET(10 MG) BY MOUTH DAILY   clindamycin  300 MG capsule Commonly known as: CLEOCIN  Take 1 capsule (300 mg total) by mouth every 8 (eight) hours for 5 days.   desloratadine  5 MG tablet Commonly known as: CLARINEX  TAKE 1 TABLET(5 MG) BY MOUTH DAILY   dextromethorphan-guaiFENesin 30-600 MG 12hr tablet Commonly known as: MUCINEX DM Take 1 tablet by mouth daily.   EPINEPHrine  0.3 mg/0.3 mL Soaj injection Commonly known as: EPI-PEN Inject 0.3 mg into the muscle as needed for anaphylaxis. USE AS DIRECTED FOR LIFE THREATENING ALLERGIC REACTIONS   famotidine  40 MG tablet Commonly known as: PEPCID  Take 1 tablet (40 mg total) by mouth at bedtime. Take 1 (ONE) tablet by mouth every evening.   fluticasone  220 MCG/ACT inhaler Commonly known as: Flovent  HFA Inhale 2 puffs into the lungs every 6 (six) hours as needed. Use with Albuterol  during flare ups. What changed:  when to take this additional instructions  gabapentin  300 MG capsule Commonly known as: NEURONTIN  TAKE 1 CAPSULE BY MOUTH IN THE MORNING, 1 CAPSULE AT LUNCH AND 2 CAPSULES AT BEDTIME   Gemtesa  75 MG Tabs Generic drug: Vibegron  1 tablet daily   Glyxambi  10-5 MG Tabs Generic drug: Empagliflozin -linaGLIPtin  Take 1 tablet by mouth daily.   hydrOXYzine  10 MG tablet Commonly known as: ATARAX  Take 1 tablet (10 mg total) by mouth daily as needed for itching.   ipratropium 0.06 % nasal spray Commonly known as: ATROVENT  Place 2 sprays into both nostrils 2 (two) times daily as needed for rhinitis.   irbesartan  150 MG tablet Commonly known as: AVAPRO  TAKE 1 TABLET(150 MG) BY MOUTH DAILY   methimazole  5 MG tablet Commonly known as: TAPAZOLE  Take 1 tablet (5 mg total) by mouth daily.   montelukast  10 MG tablet Commonly  known as: SINGULAIR  Take 1 tablet (10 mg total) by mouth at bedtime. For asthma control.   neomycin 500 MG tablet Commonly known as: MYCIFRADIN Take 500 mg by mouth 2 (two) times daily. 10 day course - start after completing metronidazole .   oxyCODONE  5 MG immediate release tablet Commonly known as: Oxy IR/ROXICODONE  Take 1 tablet (5 mg total) by mouth every 6 (six) hours as needed.   pantoprazole  40 MG tablet Commonly known as: Protonix  Take 1 tablet (40 mg total) by mouth 2 (two) times daily.   PATADAY OP Place 1 drop into both eyes 2 (two) times daily as needed (eye irritation).   polyethylene glycol powder 17 GM/SCOOP powder Commonly known as: GLYCOLAX /MIRALAX  Take 17 g by mouth daily as needed (no BM in 24 hours).   rivaroxaban  20 MG Tabs tablet Commonly known as: Xarelto  Take 1 tablet (20 mg total) by mouth daily with supper. What changed: when to take this   senna-docusate 8.6-50 MG tablet Commonly known as: Senokot-S Take 2 tablets by mouth at bedtime as needed for moderate constipation.   SYSTANE OP Place 1 drop into both eyes daily as needed (dry eyes).   traMADol  50 MG tablet Commonly known as: ULTRAM  Take 50 mg by mouth every 6 (six) hours as needed (pain).        Follow-up Information     Bedsole, Amy E, MD Follow up in 1 week(s).   Specialty: Family Medicine Contact information: [address] [place] [address] [phone]                Allergies  Allergen Reactions   Dilantin [Phenytoin] Swelling    Facial swelling   Latex Hives   Penicillins Nausea And Vomiting and Swelling   Roxicodone  [Oxycodone ] Nausea And Vomiting and Other (See Comments)    Abdominal pain   Codeine  Nausea Only    Tolerates Tylenol  with codeine  combination   Hydrocodone  Nausea And Vomiting   Lyrica [Pregabalin] Itching and Other (See Comments)    Headaches Vision changes   Nickel Hives and Rash    You were cared for by a hospitalist during  your hospital stay. If you have any questions about your discharge medications or the care you received while you were in the hospital after you are discharged, you can call the unit and asked to speak with the hospitalist on call if the hospitalist that took care of you is not available. Once you are discharged, your primary care physician will handle any further medical issues. Please note that no refills for any discharge medications will be authorized once you are discharged, as it is imperative that  you return to your primary care physician (or establish a relationship with a primary care physician if you do not have one) for your aftercare needs so that they can reassess your need for medications and monitor your lab values.  You were cared for by a hospitalist during your hospital stay. If you have any questions about your discharge medications or the care you received while you were in the hospital after you are discharged, you can call the unit and asked to speak with the hospitalist on call if the hospitalist that took care of you is not available. Once you are discharged, your primary care physician will handle any further medical issues. Please note that NO REFILLS for any discharge medications will be authorized once you are discharged, as it is imperative that you return to your primary care physician (or establish a relationship with a primary care physician if you do not have one) for your aftercare needs so that they can reassess your need for medications and monitor your lab values.  Please request your Prim.MD to go over all Hospital Tests and Procedure/Radiological results at the follow up, please get all Hospital records sent to your Prim MD by signing hospital release before you go home.  Get CBC, CMP, 2 view Chest X ray checked  by Primary MD during your next visit or SNF MD in 5-7 days ( we routinely change or add medications that can affect your baseline labs and fluid status,  therefore we recommend that you get the mentioned basic workup next visit with your PCP, your PCP may decide not to get them or add new tests based on their clinical decision)  On your next visit with your primary care physician please Get Medicines reviewed and adjusted.  If you experience worsening of your admission symptoms, develop shortness of breath, life threatening emergency, suicidal or homicidal thoughts you must seek medical attention immediately by calling 911 or calling your MD immediately  if symptoms less severe.  You Must read complete instructions/literature along with all the possible adverse reactions/side effects for all the Medicines you take and that have been prescribed to you. Take any new Medicines after you have completely understood and accpet all the possible adverse reactions/side effects.   Do not drive, operate heavy machinery, perform activities at heights, swimming or participation in water activities or provide baby sitting services if your were admitted for syncope or siezures until you have seen by Primary MD or a Neurologist and advised to do so again.  Do not drive when taking Pain medications.   Procedures/Studies: DG Foot 2 Views Right Result Date: 04/22/2024 CLINICAL DATA:  Postoperative state. EXAM: RIGHT FOOT - 2 VIEW COMPARISON:  Radiograph 04/17/2024 FINDINGS: Interval resection of the third toe. The third metatarsal head is smooth. Expected postoperative changes in the soft tissues. K-wire traverses the distal fifth metatarsal as before. Ankylosis of the great toe at the interphalangeal joint and fourth toe. Probable remote surgical change of the fifth toe. No acute fracture, erosive or bony destructive change. IMPRESSION: Interval resection of the third toe. Expected postoperative changes in the soft tissues. Electronically Signed   By: Andrea Gasman M.D.   On: 04/22/2024 12:17   PERIPHERAL VASCULAR CATHETERIZATION Result Date: 04/20/2024 Images  from the original result were not included.   Patient name: [name]         MRN: [id number]        DOB: 1946-03-30  Sex: female  04/20/2024 Pre-operative Diagnosis: Right third toe wound Post-operative diagnosis:  Same Surgeon:  Norman GORMAN Serve, MD Procedure Performed:  Ultrasound-guided access of left common femoral artery Aortogram and bilateral lower extremity angiogram Closure of the left common femoral artery 19 minutes of moderate sedation with fentanyl  and Versed   Indications: Ms. Lindahl is a 78 year old female with diabetes who is admitted with a right third toe wound.  Podiatry is planning for third toe amputation.  There are some benefits of angiogram were reviewed and she elects to proceed.  Findings: Widely patent aorta and bilateral renal arteries.  Widely patent iliac systems bilaterally. Right common femoral, SFA and profunda are widely patent.  Popliteal artery is widely patent without any flow-limiting stenosis and there is three-vessel runoff with a dominant AT and diminutive PT. Left common femoral, SFA and profunda are widely patent.  Popliteal artery is widely patent without any flow-limiting stenosis and there is three-vessel runoff with a dominant AT integrated PT.             Procedure:  The patient was identified in the holding area and taken to the cath lab  The patient was then placed supine on the table and prepped and draped in the usual sterile fashion.  A time out was called.  Ultrasound was used to evaluate the left common femoral artery.  It was patent .  A digital ultrasound image was acquired.  A micropuncture needle was used to access the left common femoral artery under ultrasound guidance.  An 018 wire was advanced without resistance and a micropuncture sheath was placed.  The 018 wire was removed and a benson wire was placed.  The micropuncture sheath was exchanged for a 5 french sheath.  An omniflush catheter was advanced over the wire to the level of L-1.  An  abdominal angiogram was obtained.  Next, using the omniflush catheter and a Bentson wire, the aortic bifurcation was crossed and the catheter was placed into theleft external iliac artery and left runoff was obtained. This demonstrated the above findings. right runoff was performed via retrograde sheath injections which demonstrated the above findings.  A Mynx closure device was then deployed into the left common femoral artery access with excellent hemostasis.  Contrast: 50cc Sedation: 19 minutes  Impression: Inline flow to bilateral feet with dominant ATs bilaterally   Norman GORMAN Serve MD [facility] Office: [phone]   VAS US  ABI WITH/WO TBI Result Date: 04/20/2024  LOWER EXTREMITY DOPPLER STUDY Patient Name:  [name]  Date of Exam:   04/18/2024 Medical Rec #: [id number]        Accession #:    [id number] Date of Birth: 12-03-1945        Patient Gender: F Patient Age:   61 years Exam Location:  [facility] Procedure:      VAS US  ABI WITH/WO TBI Referring Phys: PENNE COLORADO --------------------------------------------------------------------------------  Indications: Peripheral artery disease. High Risk Factors: Hypertension, hyperlipidemia, Diabetes.  Comparison Study: 01/20/2024 - Right: Resting right ankle-brachial index is within                   normal range. The right toe-brachial index is normal.                    Left: Resting left ankle-brachial index is within normal                   range. The left  toe-brachial index is normal. Performing Technologist: Gerome Ny RVT  Examination Guidelines: A complete evaluation includes at minimum, Doppler waveform signals and systolic blood pressure reading at the level of bilateral brachial, anterior tibial, and posterior tibial arteries, when vessel segments are accessible. Bilateral testing is considered an integral part of a complete examination. Photoelectric Plethysmograph (PPG) waveforms and toe systolic  pressure readings are included as required and additional duplex testing as needed. Limited examinations for reoccurring indications may be performed as noted.  ABI Findings: +---------+------------------+-----+-----------+--------+ Right    Rt Pressure (mmHg)IndexWaveform   Comment  +---------+------------------+-----+-----------+--------+ Brachial 142                    triphasic           +---------+------------------+-----+-----------+--------+ PTA      192               1.28 multiphasic         +---------+------------------+-----+-----------+--------+ DP       184               1.23 multiphasic         +---------+------------------+-----+-----------+--------+ Great Toe200               1.33                     +---------+------------------+-----+-----------+--------+ +---------+------------------+-----+-----------+-------+ Left     Lt Pressure (mmHg)IndexWaveform   Comment +---------+------------------+-----+-----------+-------+ Brachial 150                    triphasic          +---------+------------------+-----+-----------+-------+ PTA      169               1.13 multiphasic        +---------+------------------+-----+-----------+-------+ DP       152               1.01 multiphasic        +---------+------------------+-----+-----------+-------+ Great Toe150               1.00                    +---------+------------------+-----+-----------+-------+ +-------+-----------+-----------+------------+------------+ ABI/TBIToday's ABIToday's TBIPrevious ABIPrevious TBI +-------+-----------+-----------+------------+------------+ Right  1.28       1.33       1.01        1.01         +-------+-----------+-----------+------------+------------+ Left   1.13       1.00       1.02        0.88         +-------+-----------+-----------+------------+------------+  Summary: Right: Resting right ankle-brachial index is within normal range. The right  toe-brachial index is abnormal.  Left: Resting left ankle-brachial index is within normal range. The left toe-brachial index is normal.  *See table(s) above for measurements and observations.  Electronically signed by Gaile New MD on 04/20/2024 at 8:16:44 AM.    Final    MR FOOT RIGHT W WO CONTRAST Result Date: 04/18/2024 MR FOOT WITHOUT THEN WITH IV CONTRAST COMPARISON: X-rays and 3 2025 CLINICAL HISTORY: Osteomyelitis, foot swelling, diabetic PULSE SEQUENCES: Ax T1, Ax T2 FS, Sag T1, Sag T2 FS, Cor STIR, Cor T1 with and without IV contrast. FINDINGS: Mild degenerative changes are identified in the midfoot and forefoot. There is a metallic surgical wire in the distal fifth metatarsal. There is destruction and significant reactive edema in the third toe consistent  with septic arthritis osteomyelitis. There is remodeling of the proximal phalanx. No involvement of the third metatarsal at the current time. There is a wound of the third toe with moderate subcutaneous edema. No drainable collection is appreciated. Moderate reactive cellulitis is seen. There is mild fatty atrophy of the intrinsic muscles of the foot. No significant tenosynovitis or tendinosis is present. IMPRESSION: Osteomyelitis of the third toe centered at the interphalangeal joint. There is destruction of the distal aspect of the third proximal phalanx. Soft tissue swelling and wound. No drainable collection. Electronically signed by: Norleen Satchel MD 04/18/2024 10:33 AM EDT RP Workstation: [id number]   DG Foot Complete Right Result Date: 04/17/2024 CLINICAL DATA:  pain EXAM: RIGHT FOOT COMPLETE - 3+ VIEW COMPARISON:  04/09/2024 FINDINGS: Similar changes of an internal fixation of the fifth metatarsal neck.No acute fracture or dislocation. Bony ankylosis of the interphalangeal joints of all 5 digits. Degenerative spurring of the midfoot. Soft tissues are unremarkable. No radiopaque foreign body. IMPRESSION: No acute fracture or dislocation.  Electronically Signed   By: Rogelia Myers M.D.   On: 04/17/2024 13:15   DG Foot Complete Right Result Date: 04/09/2024 Please see detailed radiograph report in office note.  DG Foot Complete Right Result Date: 04/03/2024 Please see detailed radiograph report in office note.    The results of significant diagnostics from this hospitalization (including imaging, microbiology, ancillary and laboratory) are listed below for reference.     Microbiology: Recent Results (from the past 240 hours)  Blood culture (routine x 2)     Status: None   Collection Time: 04/17/24 12:04 PM   Specimen: BLOOD RIGHT ARM  Result Value Ref Range Status   Specimen Description BLOOD RIGHT ARM  Final   Special Requests   Final    BOTTLES DRAWN AEROBIC AND ANAEROBIC Blood Culture adequate volume   Culture   Final    NO GROWTH 5 DAYS Performed at [facility], [address] [place], [place] [address]    Report Status 04/22/2024 FINAL  Final  Blood culture (routine x 2)     Status: None   Collection Time: 04/17/24 12:09 PM   Specimen: BLOOD LEFT HAND  Result Value Ref Range Status   Specimen Description BLOOD LEFT HAND  Final   Special Requests   Final    BOTTLES DRAWN AEROBIC AND ANAEROBIC Blood Culture adequate volume   Culture   Final    NO GROWTH 5 DAYS Performed at [facility], [address] [place], [place] [address]    Report Status 04/22/2024 FINAL  Final  Aerobic/Anaerobic Culture w Gram Stain (surgical/deep wound)     Status: None (Preliminary result)   Collection Time: 04/22/24 10:42 AM   Specimen: PATH Soft tissue resection  Result Value Ref Range Status   Specimen Description TISSUE  Final   Special Requests RIGHT THIRS TOE AMPUTATION FOR CULTURES  Final   Gram Stain NO WBC SEEN NO ORGANISMS SEEN   Final   Culture   Final    NO GROWTH < 24 HOURS Performed at [facility], [address], [place], [place] [address]    Report Status PENDING  Incomplete      Labs: BNP (last 3 results) No results for input(s): BNP in the last 8760 hours. Basic Metabolic Panel: Recent Labs  Lab 04/17/24 1152 04/17/24 2029 04/18/24 0924 04/22/24 0331 04/23/24 0322  NA 144  --  143 138 139  K 4.0  --  4.3  3.6 4.4  CL 108  --  108 107 108  CO2 24  --  26 23 23   GLUCOSE 94  --  117* 94 106*  BUN 11  --  8 7* 7*  CREATININE 0.88 0.89 0.94 0.77 0.76  CALCIUM  9.7  --  9.2 8.3* 8.6*  MG  --   --   --   --  1.6*  PHOS  --   --   --   --  2.9   Liver Function Tests: Recent Labs  Lab 04/17/24 1152 04/18/24 0924  AST 20 21  ALT 7 10  ALKPHOS 107 80  BILITOT 0.8 0.6  PROT 6.5 5.8*  ALBUMIN  4.0 3.1*   No results for input(s): LIPASE, AMYLASE in the last 168 hours. No results for input(s): AMMONIA in the last 168 hours. CBC: Recent Labs  Lab 04/17/24 1152 04/17/24 2029 04/18/24 0924 04/22/24 0331 04/23/24 0322  WBC 6.0 6.5 6.5 5.4 4.9  NEUTROABS 3.8  --   --   --   --   HGB 12.3 11.9* 11.9* 11.6* 11.3*  HCT 36.3 35.8* 35.8* 34.4* 33.6*  MCV 81.0 80.8 80.8 80.2 81.0  PLT 238 246 242 218 212   Cardiac Enzymes: No results for input(s): CKTOTAL, CKMB, CKMBINDEX, TROPONINI in the last 168 hours. BNP: Invalid input(s): POCBNP CBG: Recent Labs  Lab 04/22/24 1142 04/22/24 1654 04/22/24 2110 04/23/24 0545 04/23/24 1145  GLUCAP 83 154* 114* 89 123*   D-Dimer No results for input(s): DDIMER in the last 72 hours. Hgb A1c No results for input(s): HGBA1C in the last 72 hours. Lipid Profile Recent Labs    04/21/24 1027  CHOL 94  HDL 53  LDLCALC 33  TRIG 39  CHOLHDL 1.8   Thyroid  function studies No results for input(s): TSH, T4TOTAL, T3FREE, THYROIDAB in the last 72 hours.  Invalid input(s): FREET3 Anemia work up No results for input(s): VITAMINB12, FOLATE, FERRITIN, TIBC, IRON, RETICCTPCT in the last 72 hours. Urinalysis    Component Value Date/Time   COLORURINE COLORLESS (A)  07/04/2023 1610   APPEARANCEUR CLEAR 07/04/2023 1610   LABSPEC <1.005 (L) 07/04/2023 1610   PHURINE 6.5 07/04/2023 1610   GLUCOSEU >1,000 (A) 07/04/2023 1610   HGBUR TRACE (A) 07/04/2023 1610   BILIRUBINUR NEGATIVE 07/04/2023 1610   BILIRUBINUR Negative 05/03/2023 1221   KETONESUR NEGATIVE 07/04/2023 1610   PROTEINUR NEGATIVE 07/04/2023 1610   UROBILINOGEN 0.2 05/03/2023 1221   UROBILINOGEN 1.0 04/13/2019 1544   NITRITE NEGATIVE 07/04/2023 1610   LEUKOCYTESUR NEGATIVE 07/04/2023 1610   Sepsis Labs Recent Labs  Lab 04/17/24 2029 04/18/24 0924 04/22/24 0331 04/23/24 0322  WBC 6.5 6.5 5.4 4.9   Microbiology Recent Results (from the past 240 hours)  Blood culture (routine x 2)     Status: None   Collection Time: 04/17/24 12:04 PM   Specimen: BLOOD RIGHT ARM  Result Value Ref Range Status   Specimen Description BLOOD RIGHT ARM  Final   Special Requests   Final    BOTTLES DRAWN AEROBIC AND ANAEROBIC Blood Culture adequate volume   Culture   Final    NO GROWTH 5 DAYS Performed at [facility], [address] [place], [place] [address]    Report Status 04/22/2024 FINAL  Final  Blood culture (routine x 2)     Status: None   Collection Time: 04/17/24 12:09 PM   Specimen: BLOOD LEFT HAND  Result Value Ref Range Status   Specimen Description BLOOD LEFT  HAND  Final   Special Requests   Final    BOTTLES DRAWN AEROBIC AND ANAEROBIC Blood Culture adequate volume   Culture   Final    NO GROWTH 5 DAYS Performed at [facility], [address] [place], [place] [address]    Report Status 04/22/2024 FINAL  Final  Aerobic/Anaerobic Culture w Gram Stain (surgical/deep wound)     Status: None (Preliminary result)   Collection Time: 04/22/24 10:42 AM   Specimen: PATH Soft tissue resection  Result Value Ref Range Status   Specimen Description TISSUE  Final   Special Requests RIGHT THIRS TOE AMPUTATION FOR CULTURES  Final   Gram Stain NO WBC SEEN NO ORGANISMS SEEN   Final    Culture   Final    NO GROWTH < 24 HOURS Performed at [facility], [address] [place], [place] [address]    Report Status PENDING  Incomplete     Time coordinating discharge:  I have spent 35 minutes face to face with the patient and on the ward discussing the patients care, assessment, plan and disposition with other care givers. >50% of the time was devoted counseling the patient about the risks and benefits of treatment/Discharge disposition and coordinating care.   SIGNED:   Burgess JAYSON Dare, MD  [facility] 04/23/2024, 11:46 AM   If 7PM-7AM, please contact night-coverage

## 2024-04-24 ENCOUNTER — Other Ambulatory Visit (HOSPITAL_COMMUNITY): Payer: Self-pay

## 2024-04-24 ENCOUNTER — Telehealth: Payer: Self-pay

## 2024-04-24 LAB — SURGICAL PATHOLOGY

## 2024-04-24 NOTE — Transitions of Care (Post Inpatient/ED Visit) (Signed)
   04/24/2024  Name: BRITAIN ANAGNOS MRN: [id number] DOB: 26-Jan-1946  Today's TOC FU Call Status: Today's TOC FU Call Status:: Unsuccessful Call (1st Attempt) Unsuccessful Call (1st Attempt) Date: 04/24/24  Attempted to reach the patient regarding the most recent Inpatient/ED visit.  Follow Up Plan: Additional outreach attempts will be made to reach the patient to complete the Transitions of Care (Post Inpatient/ED visit) call.    Dietrich Samuelson J. Lorilei Horan RN, MSN [facility], [facility] RN Care Manager Direct Dial: [phone]  Fax: [phone] Website: [email or web address]

## 2024-04-27 ENCOUNTER — Emergency Department (HOSPITAL_BASED_OUTPATIENT_CLINIC_OR_DEPARTMENT_OTHER)
Admission: EM | Admit: 2024-04-27 | Discharge: 2024-04-27 | Disposition: A | Discharge status: Home / Self Care | Source: Ambulatory Visit | Attending: Emergency Medicine | Admitting: Emergency Medicine

## 2024-04-27 ENCOUNTER — Other Ambulatory Visit: Payer: Self-pay

## 2024-04-27 ENCOUNTER — Ambulatory Visit: Payer: Self-pay

## 2024-04-27 ENCOUNTER — Encounter (HOSPITAL_BASED_OUTPATIENT_CLINIC_OR_DEPARTMENT_OTHER): Payer: Self-pay | Admitting: Emergency Medicine

## 2024-04-27 ENCOUNTER — Emergency Department (HOSPITAL_BASED_OUTPATIENT_CLINIC_OR_DEPARTMENT_OTHER): Admitting: Radiology

## 2024-04-27 DIAGNOSIS — E119 Type 2 diabetes mellitus without complications: Secondary | ICD-10-CM | POA: Diagnosis not present

## 2024-04-27 DIAGNOSIS — R1013 Epigastric pain: Secondary | ICD-10-CM | POA: Insufficient documentation

## 2024-04-27 DIAGNOSIS — Z7901 Long term (current) use of anticoagulants: Secondary | ICD-10-CM | POA: Diagnosis not present

## 2024-04-27 DIAGNOSIS — Z79899 Other long term (current) drug therapy: Secondary | ICD-10-CM | POA: Insufficient documentation

## 2024-04-27 DIAGNOSIS — Z91040 Latex allergy status: Secondary | ICD-10-CM | POA: Diagnosis not present

## 2024-04-27 DIAGNOSIS — I10 Essential (primary) hypertension: Secondary | ICD-10-CM | POA: Diagnosis not present

## 2024-04-27 DIAGNOSIS — I4892 Unspecified atrial flutter: Secondary | ICD-10-CM | POA: Insufficient documentation

## 2024-04-27 DIAGNOSIS — R079 Chest pain, unspecified: Secondary | ICD-10-CM | POA: Diagnosis not present

## 2024-04-27 DIAGNOSIS — I517 Cardiomegaly: Secondary | ICD-10-CM | POA: Diagnosis not present

## 2024-04-27 LAB — AEROBIC/ANAEROBIC CULTURE W GRAM STAIN (SURGICAL/DEEP WOUND)
Culture: NO GROWTH
Gram Stain: NONE SEEN

## 2024-04-27 LAB — BASIC METABOLIC PANEL WITH GFR
Anion gap: 11 (ref 5–15)
BUN: 6 mg/dL — ABNORMAL LOW (ref 8–23)
CO2: 27 mmol/L (ref 22–32)
Calcium: 9.4 mg/dL (ref 8.9–10.3)
Chloride: 103 mmol/L (ref 98–111)
Creatinine, Ser: 0.81 mg/dL (ref 0.44–1.00)
GFR, Estimated: >60 mL/min (ref >60)
Glucose, Bld: 103 mg/dL — ABNORMAL HIGH (ref 70–99)
Potassium: 3.7 mmol/L (ref 3.5–5.1)
Sodium: 141 mmol/L (ref 135–145)

## 2024-04-27 LAB — TROPONIN T, HIGH SENSITIVITY
Troponin T High Sensitivity: 20 ng/L — ABNORMAL HIGH (ref 0–19)
Troponin T High Sensitivity: 17 ng/L (ref 0–19)

## 2024-04-27 LAB — CBC
HCT: 34.4 % — ABNORMAL LOW (ref 36.0–46.0)
Hemoglobin: 11.8 g/dL — ABNORMAL LOW (ref 12.0–15.0)
MCH: 27.4 pg (ref 26.0–34.0)
MCHC: 34.3 g/dL (ref 30.0–36.0)
MCV: 79.8 fL — ABNORMAL LOW (ref 80.0–100.0)
Platelets: 155 K/uL (ref 150–400)
RBC: 4.31 MIL/uL (ref 3.87–5.11)
RDW: 15.3 % (ref 11.5–15.5)
WBC: 7.8 K/uL (ref 4.0–10.5)
nRBC: 0.3 % — ABNORMAL HIGH (ref 0.0–0.2)

## 2024-04-27 MED ORDER — ALUM & MAG HYDROXIDE-SIMETH 200-200-20 MG/5ML PO SUSP
30.0000 mL | Freq: Once | ORAL | Status: AC
Start: 2024-04-27 — End: 2024-04-27
  Administered 2024-04-27: 30 mL via ORAL
  Filled 2024-04-27: qty 30

## 2024-04-27 NOTE — Telephone Encounter (Signed)
 FYI Only or Action Required?: FYI only for provider.  Patient was last seen in primary care on 03/20/2024 by Alicia Greig BRAVO, MD.  Called Nurse Triage reporting Swallowed Foreign Body.  Symptoms began yesterday.  Interventions attempted: OTC medications: Alka Seltzer and Rest, hydration, or home remedies. Drinking hot fluids.  Symptoms are: gradually worsening.  Triage Disposition: Go to ED Now (Notify PCP)  Patient/caregiver understands and will follow disposition?: Yes  Copied from CRM [id number]. Topic: Clinical - Red Word Triage >> Apr 27, 2024 10:10 AM [facility]key ORN wrote: Red Word that prompted transfer to Nurse Triage: Blockage In Chest >> Apr 27, 2024 10:11 AM [facility]key ORN wrote: Patient states while eating dinner last night, she doesn't believe she chewed her food thoroughly now she has this blockage stuck in her chest and it hurts.  Reason for Disposition  Symptoms of FB stuck in throat or esophagus (e.g., continued pain in throat or chest, FB sensation, blood-tinged saliva)  (Exception: Pill or hard candy stuck.)  Answer Assessment - Initial Assessment Questions Advised pt to go to have someone drive them to the ED to evaluate CP and possible obstruction in esophagus. Pt reports she could probably have someone drive her. Advised to call back for worsening symptoms.  1. OBJECT: What is it?      Last night in the middle of dinner felt like food got caught in lower esophagus. Ate fried chicken leg, mashed potato, corn, snow peas.  2. SIZE: How large is it? (e.g., inches or cm; or compare it to coins)      Unsure of what food may have caused it.  3. ONSET: How long ago did you swallow it? (e.g., minutes, hours)      Last night in the middle of dinner.  4. HOW DID IT HAPPEN: Tell me how it happened.      Was eating dinner last night. In the middle of dinner noticed the pain/pressure.  5. OTHER SYMPTOMS: Are there any other symptoms? (e.g., pain in neck or chest,  difficulty breathing, difficulty swallowing)     5/10 aching pressure like pain in lower left chest area between breasts. Pain does not radiate. Drinking and eating makes it hurt worse, able to swallow saliva. Denies SOB or nausea.  Protocols used: Swallowed Foreign Body-A-AH

## 2024-04-27 NOTE — Discharge Instructions (Signed)
 Start the medication your doctor had recommended that you got from [employer].  If you are having pain and need a little extra something you can try some Maalox or Tums.  Otherwise finish your antibiotics tomorrow as planned.  Follow-up with your doctor tomorrow and Thursday.  If you start having shortness of breath, fever or other concerns return to the emergency room.

## 2024-04-27 NOTE — ED Provider Notes (Signed)
 [place] EMERGENCY DEPARTMENT AT [facility] Provider Note   CSN: [id number] Arrival date & time: 04/27/24  1248     Patient presents with: Chest Pain   [name] is a 78 y.o. female.   Pt is a 78y/o female with hx of type 2 diabetes, atrial flutter on Xarelto ,  Graves' disease, GERD, PE and DVT, hypertension, hyperlipidemia, pulmonary hypertension, sick sinus syndrome, recent admission and discharge 4 days ago after having osteomyelitis and amputation as well as having arterial evaluation to ensure no PAD which did not need intervention who is presenting today with complaints of chest/abdominal pain.  Patient reports the symptoms started last night after she had eaten and felt like food got stuck and cause pain.  She reports it hurt throughout the night but she did drink some hot tea which made it feel little bit better.  It was okay this morning initially when she woke up but then started hurting more.  She was able to eat and swallow but felt that it made the pain a little bit worse.  Swallowing makes the pain worse and burping.  She has not felt short of breath and denies any pleuritic pain.  She has not had any nausea or vomiting and reports her diarrhea is improving from when she was in the hospital.  Her foot seems to be doing well and she has no complaints.  She has not noticed significant swelling in her legs.  She has been compliant with her medications including her Xarelto .  She does report that she takes Pepcid  twice a day.  She is still on clindamycin  and has a few days left.  The history is provided by the patient.  Chest Pain      Prior to Admission medications   Medication Sig Start Date End Date Taking? Authorizing Provider  acetaminophen  (TYLENOL ) 500 MG tablet Take 1,000 mg by mouth 2 (two) times daily as needed for fever or headache (pain).    [provider]  ADVAIR  HFA 230-21 MCG/ACT inhaler Inhale 2 puffs into the lungs 2 (two) times daily.  On empty lungs 03/17/24   Kozlow, Camellia PARAS, MD  albuterol  (PROVENTIL ) (2.5 MG/3ML) 0.083% nebulizer solution Take 3 mLs (2.5 mg total) by nebulization every 6 (six) hours as needed for wheezing or shortness of breath. 03/17/24   Kozlow, Camellia PARAS, MD  albuterol  (VENTOLIN  HFA) 108 (90 Base) MCG/ACT inhaler Inhale 2 puffs into the lungs every 6 (six) hours as needed for wheezing or shortness of breath (Take with Flovent  During Flare Ups.). Patient taking differently: Inhale 2 puffs into the lungs See admin instructions. Inhale 2 puffs every 6 hours as needed for wheezing, shortness of breath. While out of Advair , use 2 puffs every morning and use Flovent  at night. 03/17/24   Kozlow, Camellia PARAS, MD  anastrozole  (ARIMIDEX ) 1 MG tablet Take 1 tablet (1 mg total) by mouth daily. 04/07/24   Gudena, Vinay, MD  atorvastatin  (LIPITOR) 10 MG tablet TAKE 1 TABLET(10 MG) BY MOUTH DAILY 07/08/23   Bedsole, Amy E, MD  clindamycin  (CLEOCIN ) 300 MG capsule Take 1 capsule (300 mg total) by mouth every 8 (eight) hours for 5 days. 04/23/24 04/28/24  Amin, Ankit C, MD  desloratadine  (CLARINEX ) 5 MG tablet TAKE 1 TABLET(5 MG) BY MOUTH DAILY 02/26/24   Kozlow, Eric J, MD  dextromethorphan-guaiFENesin [facility] DM) 30-600 MG 12hr tablet Take 1 tablet by mouth daily.    [provider]  Empagliflozin -linaGLIPtin  (GLYXAMBI ) 10-5 MG TABS Take  1 tablet by mouth daily. 04/01/24   Bedsole, Amy E, MD  EPINEPHrine  0.3 mg/0.3 mL IJ SOAJ injection Inject 0.3 mg into the muscle as needed for anaphylaxis. USE AS DIRECTED FOR LIFE THREATENING ALLERGIC REACTIONS 03/17/24   Kozlow, Camellia PARAS, MD  famotidine  (PEPCID ) 40 MG tablet Take 1 tablet (40 mg total) by mouth at bedtime. Take 1 (ONE) tablet by mouth every evening. 03/17/24   Kozlow, Camellia PARAS, MD  fluticasone  (FLOVENT  HFA) 220 MCG/ACT inhaler Inhale 2 puffs into the lungs every 6 (six) hours as needed. Use with Albuterol  during flare ups. Patient taking differently: Inhale 2 puffs into the lungs See admin  instructions. Inhale 2 puffs every 12 hours hours as needed for wheezing, shortness of breath. While out of Advair , inhale 2 puffs every night and use albuterol  every morning. 01/07/24   Kozlow, Camellia PARAS, MD  gabapentin  (NEURONTIN ) 300 MG capsule TAKE 1 CAPSULE BY MOUTH IN THE MORNING, 1 CAPSULE AT LUNCH AND 2 CAPSULES AT BEDTIME 01/20/24   Hyatt, Max T, DPM  hydrOXYzine  (ATARAX ) 10 MG tablet Take 1 tablet (10 mg total) by mouth daily as needed for itching. 05/03/23   Bedsole, Amy E, MD  ipratropium (ATROVENT ) 0.06 % nasal spray Place 2 sprays into both nostrils 2 (two) times daily as needed for rhinitis. 05/28/23   Kozlow, Camellia PARAS, MD  irbesartan  (AVAPRO ) 150 MG tablet TAKE 1 TABLET(150 MG) BY MOUTH DAILY 01/09/24   Croitoru, Mihai, MD  methimazole  (TAPAZOLE ) 5 MG tablet Take 1 tablet (5 mg total) by mouth daily. 11/05/23   Trixie File, MD  montelukast  (SINGULAIR ) 10 MG tablet Take 1 tablet (10 mg total) by mouth at bedtime. For asthma control. 03/17/24   Kozlow, Eric J, MD  neomycin (MYCIFRADIN) 500 MG tablet Take 500 mg by mouth 2 (two) times daily. 10 day course - start after completing metronidazole . Patient not taking: Reported on 04/18/2024    [provider]  Olopatadine HCl (PATADAY OP) Place 1 drop into both eyes 2 (two) times daily as needed (eye irritation).    [provider]  oxyCODONE  (OXY IR/ROXICODONE ) 5 MG immediate release tablet Take 1 tablet (5 mg total) by mouth every 6 (six) hours as needed. 04/23/24   Amin, Ankit C, MD  pantoprazole  (PROTONIX ) 40 MG tablet Take 1 tablet (40 mg total) by mouth 2 (two) times daily. 03/17/24   Kozlow, Camellia PARAS, MD  Polyethyl Glycol-Propyl Glycol (SYSTANE OP) Place 1 drop into both eyes daily as needed (dry eyes).    [provider]  polyethylene glycol powder (GLYCOLAX /MIRALAX ) 17 GM/SCOOP powder Take 17 g by mouth daily as needed (no BM in 24 hours).    [provider]  rivaroxaban  (XARELTO ) 20 MG TABS tablet Take 1  tablet (20 mg total) by mouth daily with supper. Patient taking differently: Take 20 mg by mouth at bedtime. 04/16/23   Croitoru, Mihai, MD  senna-docusate (SENOKOT-S) 8.6-50 MG tablet Take 2 tablets by mouth at bedtime as needed for moderate constipation. 04/23/24   Amin, Ankit C, MD  traMADol  (ULTRAM ) 50 MG tablet Take 50 mg by mouth every 6 (six) hours as needed (pain).    [provider]  Vibegron  (GEMTESA ) 75 MG TABS 1 tablet daily 12/02/23   Avelina Greig BRAVO, MD    Allergies: Dilantin [phenytoin], Latex, Penicillins, Roxicodone  [oxycodone ], Codeine , Hydrocodone , Lyrica [pregabalin], and Nickel    Review of Systems  Cardiovascular:  Positive for chest pain.    Updated Vital Signs BP ROLLEN)  153/95   Pulse 65   Temp 98.1 F (36.7 C) (Axillary)   Resp 18   SpO2 98%   Physical Exam Vitals and nursing note reviewed.  Constitutional:      General: She is not in acute distress.    Appearance: She is well-developed.  HENT:     Head: Normocephalic and atraumatic.     Mouth/Throat:     Mouth: Mucous membranes are moist.  Eyes:     Pupils: Pupils are equal, round, and reactive to light.  Cardiovascular:     Rate and Rhythm: Normal rate and regular rhythm.     Heart sounds: Murmur heard.     Systolic murmur is present with a grade of 1/6.     No friction rub.  Pulmonary:     Effort: Pulmonary effort is normal.     Breath sounds: Normal breath sounds. No wheezing or rales.  Abdominal:     General: Bowel sounds are normal. There is no distension.     Palpations: Abdomen is soft.     Tenderness: There is abdominal tenderness in the epigastric area. There is no guarding or rebound.  Musculoskeletal:        General: No tenderness. Normal range of motion.     Comments: No edema.  Right foot with bandages in place that are clean without any blood or purulent material on the bandages  Skin:    General: Skin is warm and dry.     Findings: No rash.  Neurological:     Mental Status:  She is alert and oriented to person, place, and time.     Cranial Nerves: No cranial nerve deficit.  Psychiatric:        Behavior: Behavior normal.     (all labs ordered are listed, but only abnormal results are displayed) Labs Reviewed  BASIC METABOLIC PANEL WITH GFR - Abnormal; Notable for the following components:      Result Value   Glucose, Bld 103 (*)    BUN 6 (*)    All other components within normal limits  CBC - Abnormal; Notable for the following components:   Hemoglobin 11.8 (*)    HCT 34.4 (*)    MCV 79.8 (*)    nRBC 0.3 (*)    All other components within normal limits  TROPONIN T, HIGH SENSITIVITY - Abnormal; Notable for the following components:   Troponin T High Sensitivity 20 (*)    All other components within normal limits  TROPONIN T, HIGH SENSITIVITY    EKG: None  Radiology: DG Chest 2 View Result Date: 04/27/2024 CLINICAL DATA:  Chest pain. EXAM: CHEST - 2 VIEW COMPARISON:  Chest radiograph dated 10/10/2020. FINDINGS: Cardiomegaly, unchanged. No overt edema, focal consolidation, pleural effusion, or pneumothorax. Postoperative changes of the left breast. Degenerative changes of the bilateral glenohumeral joints. Degenerative changes of the thoracic spine. No acute osseous abnormality. IMPRESSION: 1. No acute cardiopulmonary findings. 2. Cardiomegaly. Electronically Signed   By: Harrietta Sherry M.D.   On: 04/27/2024 13:54     Procedures   Medications Ordered in the ED  alum & mag hydroxide-simeth (MAALOX/MYLANTA) 200-200-20 MG/5ML suspension 30 mL (30 mLs Oral Given 04/27/24 1648)                                    Medical Decision Making Amount and/or Complexity of Data Reviewed Labs: ordered. Decision-making details documented in ED Course.  Radiology: ordered and independent interpretation performed. Decision-making details documented in ED Course. ECG/medicine tests: ordered and independent interpretation performed. Decision-making details  documented in ED Course.  Risk OTC drugs.   Pt with multiple medical problems and comorbidities and presenting today with a complaint that caries a high risk for morbidity and mortality.  Here today with the above complaints.  Patient reports the pain did start with eating but she does not have specific findings of a food bolus impaction as she is able to tolerate her own saliva and she has been able to eat this morning without any regurgitation or vomiting.  Concern for possible GERD related to recent hospitalization, surgery, antibiotics.  Lower suspicion for esophageal rupture.  Patient has some minimal epigastric discomfort on exam as well possibility for gastritis.  Lower suspicion for PE or DVT as patient is chronically anticoagulated.  Also she is not complaining of symptoms concerning for pneumonia or CHF without prior history of CHF.  Patient does not have history of CAD and symptoms are not classic for ACS but will screen with an EKG and troponin.  I independently interpreted patient's EKG and labs.  BMP without acute findings, CBC with stable hemoglobin and white count, troponin is 20 without old to compare and will get a delta.  EKG without acute changes today.  I have independently visualized and interpreted pt's images today. Chest x-ray without acute findings and chronic cardiomegaly.  Patient given a GI cocktail.  6:09 PM Delta troponin is normal at 17 and lower than even the first.  At this time feel that patient is stable for discharge home and feel that this is mostly GI in nature and not cardiac.  Patient states that she recently ordered a medication that her specialist had recommended to help block acid but had not started it yet.  Recommended she start taking that medication and continue with her Pepcid /pantoprazole      Final diagnoses:  Epigastric pain    ED Discharge Orders     None          Doretha Folks, MD 04/27/24 1810

## 2024-04-27 NOTE — ED Notes (Signed)
 DC paperwork given and verbally understood.

## 2024-04-27 NOTE — ED Triage Notes (Signed)
 Reports central CP x last night. Reports pain started after eating last night. Reports feeling of food is stuck

## 2024-04-28 ENCOUNTER — Ambulatory Visit (INDEPENDENT_AMBULATORY_CARE_PROVIDER_SITE_OTHER): Admitting: Podiatry

## 2024-04-28 ENCOUNTER — Telehealth: Payer: Self-pay

## 2024-04-28 DIAGNOSIS — Z9889 Other specified postprocedural states: Secondary | ICD-10-CM

## 2024-04-28 DIAGNOSIS — M86171 Other acute osteomyelitis, right ankle and foot: Secondary | ICD-10-CM

## 2024-04-28 NOTE — Telephone Encounter (Signed)
 Agree with ED evaluation.

## 2024-04-28 NOTE — Progress Notes (Signed)
  Subjective:  Patient ID: [name], female    DOB: 12/22/1945,  MRN: [id number]  Chief Complaint  Patient presents with   Wound Check    POV#1 DOS: 04/22/2024: Amputation of right third toe at MPJ level. 0 pain. NIDDM 5.6. Taking Clindamycin .    DOS: 04/22/2024 Procedure: Amputation of right third toe at MPJ level  78 y.o. female seen for post op check.  Patient following up 1 week after surgery.  Doing well reports no pain.  Walking in postop shoe.  Has light dressing clean dry and intact as instructed.  Has 1 more day of antibiotics left.  No concerns.  Review of Systems: Negative except as noted in the HPI. Denies N/V/F/Ch.   Objective:   Constitutional Well developed. Well nourished.  Vascular Foot warm and well perfused. Capillary refill normal to all digits.   No calf pain with palpation  Neurologic Normal speech. Oriented to person, place, and time. Epicritic sensation diminished to right foot  Dermatologic Amputation site right third toe well coapted no maceration no drainage or dehiscence   Orthopedic: Status post right third toe amputation MPJ level with postop shoe in place   Radiographs: Interval resection of the third toe. Expected postoperative changes in the soft tissues.  Pathology:  A. RIGHT THIRD TOE, AMPUTATION:  Reactive cortical and trabecular bone with associated fibrosis and  chronic inflammation compatible with chronic osteomyelitis  Overlying hyperkeratotic skin showing acute inflammation  Proximal skin, soft tissue and bone margin grossly and histologically  free of inflammation   Micro: No growth seen aerobically or anaerobically  Assessment:   1. Acute osteomyelitis of toe of right foot (HCC)   2. Post-operative state    Osteomyelitis of right third toe status post amputation at MPJ level  Plan:  Patient was evaluated and treated and all questions answered.  1 week s/p right third toe amputation MPJ level -Progressing as expected  postoperatively, amputation site healing well no dehiscence drainage or maceration no evidence of residual infection -XR: Expected postop findings -WB Status: Weightbearing as tolerated in postop shoe -Sutures: Remain intact another week -Medications/ABX: Finish course of postop antibiotics clindamycin  and then monitor off antibiotics -Dressing: Replace new Xeroform 4 x 4 Kerlix and Ace roll dressing.  Leave dressing clean dry and intact until follow-up next week - F/u Plan: 1 week for possible suture removal        [name], DPM [facility] / [facility]

## 2024-04-28 NOTE — Transitions of Care (Post Inpatient/ED Visit) (Signed)
   04/28/2024  Name: ABBAGALE GOGUEN MRN: [id number] DOB: December 21, 1945  Today's TOC FU Call Status: Today's TOC FU Call Status:: Unsuccessful Call (1st Attempt) Unsuccessful Call (1st Attempt) Date: 04/28/24  Attempted to reach the patient regarding the most recent Inpatient/ED visit.  Follow Up Plan: Additional outreach attempts will be made to reach the patient to complete the Transitions of Care (Post Inpatient/ED visit) call.   Arvin Seip RN, BSN, CCM [employer], Population Health Case Manager Phone: [phone]

## 2024-04-29 ENCOUNTER — Telehealth: Payer: Self-pay

## 2024-04-29 NOTE — Transitions of Care (Post Inpatient/ED Visit) (Signed)
 04/29/2024  Name: [name] MRN: [id number] DOB: 13-Jan-1946  Today's TOC FU Call Status: Today's TOC FU Call Status:: Successful TOC FU Call Completed TOC FU Call Complete Date: 04/29/24 [name].  Transition Care Management Follow-up Telephone Call How have you been since you were released from the hospital?: Better Any questions or concerns?: No  Items Reviewed: Did you receive and understand the discharge instructions provided?: Yes Medications obtained,verified, and reconciled?: Yes (Medications Reviewed) Any new allergies since your discharge?: No Dietary orders reviewed?: Yes Type of Diet Ordered:: Diabetic diet Do you have support at home?: Yes People in Home [RPT]: alone Name of Support/Comfort Primary Source: Patient states her neighbor helps her as needed  Medications Reviewed Today: Medications Reviewed Today     Reviewed by Lauro Shona LABOR, RN (Registered Nurse) on 04/29/24 at 1159  Med List Status: <None>   Medication Order Taking? Sig Documenting Provider Last Dose Status Informant  acetaminophen  (TYLENOL ) 500 MG tablet [id number] Yes Take 1,000 mg by mouth 2 (two) times daily as needed for fever or headache (pain). [provider]  Active Self, Pharmacy Records  ADVAIR  [facility] 230-21 MCG/ACT inhaler [id number] Yes Inhale 2 puffs into the lungs 2 (two) times daily. On empty lungs Kozlow, Camellia PARAS, MD  Active Self, Pharmacy Records  albuterol  (PROVENTIL ) (2.5 MG/3ML) 0.083% nebulizer solution [id number] Yes Take 3 mLs (2.5 mg total) by nebulization every 6 (six) hours as needed for wheezing or shortness of breath. Kozlow, Eric J, MD  Active Self, Pharmacy Records  albuterol  (VENTOLIN  HFA) [phone] Base) MCG/ACT inhaler [id number] Yes Inhale 2 puffs into the lungs every 6 (six) hours as needed for wheezing or shortness of breath (Take with Flovent  During Flare Ups.).  Patient taking differently: Inhale 2 puffs into the lungs See  admin instructions. Inhale 2 puffs every 6 hours as needed for wheezing, shortness of breath. While out of Advair , use 2 puffs every morning and use Flovent  at night.   Kozlow, Eric J, MD  Active Self, Pharmacy Records  anastrozole  (ARIMIDEX ) 1 MG tablet [id number] Yes Take 1 tablet (1 mg total) by mouth daily. Gudena, Vinay, MD  Active Self, Pharmacy Records  atorvastatin  (LIPITOR) 10 MG tablet [id number] Yes TAKE 1 TABLET(10 MG) BY MOUTH DAILY Avelina, Amy E, MD  Active Self, Pharmacy Records  desloratadine  (CLARINEX ) 5 MG tablet [id number] Yes TAKE 1 TABLET(5 MG) BY MOUTH DAILY Kozlow, Eric J, MD  Active Self, Pharmacy Records  dextromethorphan-guaiFENesin [facility] DM) 30-600 MG 12hr tablet [id number] Yes Take 1 tablet by mouth daily. [provider]  Active Self, Pharmacy Records  Empagliflozin -linaGLIPtin  (GLYXAMBI ) 10-5 MG TABS [id number] Yes Take 1 tablet by mouth daily. Avelina Greig BRAVO, MD  Active Self, Pharmacy Records  EPINEPHrine  0.3 mg/0.3 mL IJ SOAJ injection [id number] Yes Inject 0.3 mg into the muscle as needed for anaphylaxis. USE AS DIRECTED FOR LIFE THREATENING ALLERGIC REACTIONS Kozlow, Eric J, MD  Active Self, Pharmacy Records  famotidine  (PEPCID ) 40 MG tablet [id number] Yes Take 1 tablet (40 mg total) by mouth at bedtime. Take 1 (ONE) tablet by mouth every evening. Kozlow, Eric J, MD  Active Self, Pharmacy Records  fluticasone  (FLOVENT  [facility]) 220 MCG/ACT inhaler [id number] Yes Inhale 2 puffs into the lungs every 6 (six) hours as needed. Use with Albuterol  during flare ups. Kozlow, Eric J, MD  Active Self, Pharmacy Records  gabapentin  (NEURONTIN ) 300 MG capsule [id number] Yes TAKE 1 CAPSULE BY MOUTH IN THE MORNING, 1 CAPSULE  AT LUNCH AND 2 CAPSULES AT BEDTIME Hyatt, Max T, DPM  Active Self, Pharmacy Records  hydrOXYzine  (ATARAX ) 10 MG tablet [id number] Yes Take 1 tablet (10 mg total) by mouth daily as needed for itching. Avelina Greig BRAVO, MD  Active Self, Pharmacy Records  ipratropium  (ATROVENT ) 0.06 % nasal spray [id number] Yes Place 2 sprays into both nostrils 2 (two) times daily as needed for rhinitis. Kozlow, Eric J, MD  Active Self, Pharmacy Records  irbesartan  (AVAPRO ) 150 MG tablet [id number] Yes TAKE 1 TABLET(150 MG) BY MOUTH DAILY Croitoru, Mihai, MD  Active Self, Pharmacy Records  methimazole  (TAPAZOLE ) 5 MG tablet [id number] Yes Take 1 tablet (5 mg total) by mouth daily. Trixie File, MD  Active Self, Pharmacy Records  montelukast  (SINGULAIR ) 10 MG tablet [id number] Yes Take 1 tablet (10 mg total) by mouth at bedtime. For asthma control. Kozlow, Eric J, MD  Active Self, Pharmacy Records  neomycin (MYCIFRADIN) 500 MG tablet [id number]  Take 500 mg by mouth 2 (two) times daily. 10 day course - start after completing metronidazole .  Patient not taking: Reported on 04/29/2024   [provider]  Active Self, Pharmacy Records  Olopatadine HCl (PATADAY OP) [id number] Yes Place 1 drop into both eyes 2 (two) times daily as needed (eye irritation). [provider]  Active Self, Pharmacy Records  oxyCODONE  (OXY IR/ROXICODONE ) 5 MG immediate release tablet [id number] Yes Take 1 tablet (5 mg total) by mouth every 6 (six) hours as needed. Caleen Burgess BROCKS, MD  Active   pantoprazole  (PROTONIX ) 40 MG tablet [id number] Yes Take 1 tablet (40 mg total) by mouth 2 (two) times daily. Kozlow, Eric J, MD  Active Self, Pharmacy Records  Polyethyl Glycol-Propyl Glycol Dresser OP) [id number] Yes Place 1 drop into both eyes daily as needed (dry eyes). [provider]  Active Self, Pharmacy Records  polyethylene glycol powder (GLYCOLAX /MIRALAX ) 17 GM/SCOOP powder [id number] Yes Take 17 g by mouth daily as needed (no BM in 24 hours). [provider]  Active Self, Pharmacy Records  rivaroxaban  (XARELTO ) 20 MG TABS tablet [id number] Yes Take 1 tablet (20 mg total) by mouth daily with supper. Croitoru, Mihai, MD  Active Self, Pharmacy Records  senna-docusate (SENOKOT-S) 8.6-50  MG tablet [id number] Yes Take 2 tablets by mouth at bedtime as needed for moderate constipation. Caleen Burgess BROCKS, MD  Active   tezepelumab -ekko (TEZSPIRE ) 210 MG/1. syringe 210 mg [id number]   Kozlow, Eric J, MD  Active   traMADol  (ULTRAM ) 50 MG tablet [id number] Yes Take 50 mg by mouth every 6 (six) hours as needed (pain). [provider]  Active Self, Pharmacy Records  Vibegron  (GEMTESA ) 75 MG TABS [id number] Yes 1 tablet daily Bedsole, Amy E, MD  Active Self, Pharmacy Records            Home Care and Equipment/Supplies: Were Home Health Services Ordered?: No Any new equipment or medical supplies ordered?: Yes Name of Medical supply agency?: patient states she came home from hospital with right post op shoe Were you able to get the equipment/medical supplies?: Yes Do you have any questions related to the use of the equipment/supplies?: No  Functional Questionnaire: Do you need assistance with bathing/showering or dressing?: No (states she is normally indendent but is sponge bathing now to keep foot dry) Do you need assistance with meal preparation?: No Do you need assistance with eating?: No Do you have difficulty maintaining continence: Yes (patient reports occasional bladder incontinence and sees urologist) Do you need assistance with getting out  of bed/getting out of a chair/moving?: No Do you have difficulty managing or taking your medications?: No  Follow up appointments reviewed: PCP Follow-up appointment confirmed?: Yes Date of PCP follow-up appointment?: 04/30/24 Follow-up Provider: Avelina Greig BRAVO, MD Specialist Hospital Follow-up appointment confirmed?: Yes Date of Specialist follow-up appointment?: 05/05/24 Follow-Up Specialty Provider:: Saw Poditatrist 04/28/24 Endo: Tawni Fendt  05/04/24 Do you need transportation to your follow-up appointment?: No Do you understand care options if your condition(s) worsen?: Yes-patient verbalized understanding  SDOH  Interventions Today    Flowsheet Row Most Recent Value  SDOH Interventions   Food Insecurity Interventions Intervention Not Indicated  Housing Interventions Intervention Not Indicated  Transportation Interventions Intervention Not Indicated  Utilities Interventions Intervention Not Indicated    Goals Addressed             This Visit's Progress    VBCI Transitions of Care (TOC) Care Plan       Problems:  Recent Hospitalization for treatment of Admit/Discharge Date   10/3 - 04/23/24   Primary Diagnosis: Diabetic foot infection s/p amputation on 10/8 right 3rd toe Patient has not been checking her sugar - agrees to start cchecking  Goal:  Over the next 30 days, the patient will not experience hospital readmission  Interventions:  Transitions of Care: Doctor Visits  - discussed the importance of doctor visits Reviewed Signs and symptoms of infection Educated on importance of monitoring blood sugar and diabetic diet Reviewed outcome of her 04/28/24 podiatry appt and discussed next appt 05/05/24 Discussed 05/04/24 1 year appointment with endo: Tawni Fendt - patient was not aware of appt    Diabetes Interventions: Assessed patient's understanding of A1c goal: maintain <6.0% Provided education to patient about basic DM disease process Reviewed medications with patient and discussed importance of medication adherence Discussed plans with patient for ongoing care management follow up and provided patient with direct contact information for care management team Reviewed scheduled/upcoming provider appointments including: Dr Fendt 05/04/24 and PCP 04/30/24 Assessed social determinant of health barriers Lab Results  Component Value Date   HGBA1C 5.6 04/17/2024    Surgery (right 3rd toe amputation): Evaluation of current treatment plan related to right 3rd toe amputation surgery reviewed post-operative instructions with patient/caregiver reviewed medications with patient and  addressed questions reviewed scheduled provider appointments with patient- next podiatry appt 05/05/24  Patient Self Care Activities:  Attend all scheduled provider appointments Call pharmacy for medication refills 3-7 days in advance of running out of medications Call provider office for new concerns or questions  Notify RN Care Manager of TOC call rescheduling needs Participate in Transition of Care Program/Attend TOC scheduled calls Take medications as prescribed   check feet daily for cuts, sores or redness enter blood sugar readings and medication or insulin  into daily log take the blood sugar log to all doctor visits wash and dry feet carefully every day - but keep right foot clean and dry wear comfortable, cotton socks wear comfortable, well-fitting shoes  Plan:  Telephone follow up appointment with care management team member scheduled for:  05/06/24 in the morning The patient has been provided with contact information for the care management team and has been advised to call with any health related questions or concerns.         Shona Prow RN, CCM [place]  VBCI-Population Health RN Care Manager [phone]

## 2024-04-29 NOTE — Patient Instructions (Signed)
 Visit Information  Thank you for taking time to visit with me today. Please don't hesitate to contact me if I can be of assistance to you before our next scheduled telephone appointment.  Our next appointment is by telephone on 05/06/24 in the morning  Following is a copy of your care plan:   Goals Addressed             This Visit's Progress    VBCI Transitions of Care (TOC) Care Plan       Problems:  Recent Hospitalization for treatment of Admit/Discharge Date   10/3 - 04/23/24   Primary Diagnosis: Diabetic foot infection s/p amputation on 10/8 right 3rd toe Patient has not been checking her sugar - agrees to start cchecking  Goal:  Over the next 30 days, the patient will not experience hospital readmission  Interventions:  Transitions of Care: Doctor Visits  - discussed the importance of doctor visits Reviewed Signs and symptoms of infection Educated on importance of monitoring blood sugar and diabetic diet Reviewed outcome of her 04/28/24 podiatry appt and discussed next appt 05/05/24 Discussed 05/04/24 1 year appointment with endo: Tawni Fendt - patient was not aware of appt    Diabetes Interventions: Assessed patient's understanding of A1c goal: maintain <6.0% Provided education to patient about basic DM disease process Reviewed medications with patient and discussed importance of medication adherence Discussed plans with patient for ongoing care management follow up and provided patient with direct contact information for care management team Reviewed scheduled/upcoming provider appointments including: Dr Fendt 05/04/24 and PCP 04/30/24 Assessed social determinant of health barriers Lab Results  Component Value Date   HGBA1C 5.6 04/17/2024    Surgery (right 3rd toe amputation): Evaluation of current treatment plan related to right 3rd toe amputation surgery reviewed post-operative instructions with patient/caregiver reviewed medications with patient and  addressed questions reviewed scheduled provider appointments with patient- next podiatry appt 05/05/24  Patient Self Care Activities:  Attend all scheduled provider appointments Call pharmacy for medication refills 3-7 days in advance of running out of medications Call provider office for new concerns or questions  Notify RN Care Manager of TOC call rescheduling needs Participate in Transition of Care Program/Attend TOC scheduled calls Take medications as prescribed   check feet daily for cuts, sores or redness enter blood sugar readings and medication or insulin  into daily log take the blood sugar log to all doctor visits wash and dry feet carefully every day - but keep right foot clean and dry wear comfortable, cotton socks wear comfortable, well-fitting shoes  Plan:  Telephone follow up appointment with care management team member scheduled for:  05/06/24 in the morning The patient has been provided with contact information for the care management team and has been advised to call with any health related questions or concerns.         Patient verbalizes understanding of instructions and care plan provided today and agrees to view in MyChart. Active MyChart status and patient understanding of how to access instructions and care plan via MyChart confirmed with patient.     Telephone follow up appointment with care management team member scheduled for: 0/22/25 The patient has been provided with contact information for the care management team and has been advised to call with any health related questions or concerns.   Please call the care guide team at [phone] if you need to cancel or reschedule your appointment.   Please call the Suicide and Crisis Lifeline: 988 call 1-800-273-TALK (toll free,  24 hour hotline) call 911 if you are experiencing a Mental Health or Behavioral Health Crisis or need someone to talk to.  Shona Prow RN, CCM [place]  VBCI-Population  Health RN Care Manager [phone]

## 2024-04-30 ENCOUNTER — Ambulatory Visit (INDEPENDENT_AMBULATORY_CARE_PROVIDER_SITE_OTHER): Admitting: Family Medicine

## 2024-04-30 VITALS — BP 130/82 | HR 63 | Temp 97.9°F | Ht 63.5 in | Wt 197.4 lb

## 2024-04-30 DIAGNOSIS — Z89421 Acquired absence of other right toe(s): Secondary | ICD-10-CM

## 2024-04-30 DIAGNOSIS — K219 Gastro-esophageal reflux disease without esophagitis: Secondary | ICD-10-CM | POA: Diagnosis not present

## 2024-04-30 DIAGNOSIS — E1140 Type 2 diabetes mellitus with diabetic neuropathy, unspecified: Secondary | ICD-10-CM | POA: Diagnosis not present

## 2024-04-30 MED ORDER — GLUCOSE BLOOD VI STRP: ORAL_STRIP | 12 refills | Status: DC

## 2024-04-30 MED ORDER — GLUCOSE BLOOD VI STRP: ORAL_STRIP | 12 refills | Status: AC

## 2024-04-30 NOTE — Progress Notes (Signed)
 Patient ID: [name], female    DOB: 06/06/1946, 78 y.o.   MRN: [id number]  This visit was conducted in person.  BP 130/82   Pulse 63   Temp 97.9 F (36.6 C) (Oral)   Ht 5' 3.5 (1.613 m)   Wt 197 lb 6 oz (89.5 kg)   SpO2 98%   BMI 34.41 kg/m    CC:  Chief Complaint  Patient presents with   Hospitalization Follow-up    Pt here for hospital f/u . Toe amputation.    Subjective:   HPI: [name] is a 78 y.o. female presenting on 04/30/2024 for Hospitalization Follow-up (Pt here for hospital f/u . Toe amputation.)  Recent hospitalization on October 3 for diabetic foot infection. Initial infection did not improve with clindamycin /doxycycline . In the ED given meropenem and Zyvox as well as a fluid bolus. Underwent bilateral lower extremity aortogram showing patent vessels. MRI was consistent with osteomyelitis of right third toe. Patient underwent amputation of right third toe October 8. Postoperatively was treated with clindamycin  5 days  Patient returned to the ED on October 13 when she had new onset issues with chest/abdominal pain feeling like food got stuck after eating.  EKG,  basic metabolic panel, CBC without acute changes, stable hemoglobin and white count, troponin 20, but trended down to 17 on recheck. Chest x-ray without acute findings and chronic cardiomegaly Patient was given GI cocktail Recommended Pepcid /pantoprazole   Reviewed podiatry follow-up postop note from April 28, 2024, felt wound healing well without infection or dehiscence.  Today she presents for follow-up.  She occ feels lump in chest.. feels like pill get stuck. Using pepcid   Ac and pantoprazole . Using reflux gourmet  to coat esophagus.  Also finishing Xifaxan for SIBO   Using voltaren  gel 4-5 times a day  for right knee pain... plans to follow up with Ortho regarding it.   Minimal pain in toe.. not having to use oxycodone .  Since surgery blood sugars are stable.  DM  Lab  Results  Component Value Date   HGBA1C 5.6 04/17/2024     Relevant past medical, surgical, family and social history reviewed and updated as indicated. Interim medical history since our last visit reviewed. Allergies and medications reviewed and updated. Outpatient Medications Prior to Visit  Medication Sig Dispense Refill   acetaminophen  (TYLENOL ) 500 MG tablet Take 1,000 mg by mouth 2 (two) times daily as needed for fever or headache (pain).     ADVAIR  HFA 230-21 MCG/ACT inhaler Inhale 2 puffs into the lungs 2 (two) times daily. On empty lungs 36 g 1   albuterol  (PROVENTIL ) (2.5 MG/3ML) 0.083% nebulizer solution Take 3 mLs (2.5 mg total) by nebulization every 6 (six) hours as needed for wheezing or shortness of breath. 150 mL 1   albuterol  (VENTOLIN  HFA) 108 (90 Base) MCG/ACT inhaler Inhale 2 puffs into the lungs every 6 (six) hours as needed for wheezing or shortness of breath (Take with Flovent  During Flare Ups.). (Patient taking differently: Inhale 2 puffs into the lungs See admin instructions. Inhale 2 puffs every 6 hours as needed for wheezing, shortness of breath. While out of Advair , use 2 puffs every morning and use Flovent  at night.) 18 g 1   anastrozole  (ARIMIDEX ) 1 MG tablet Take 1 tablet (1 mg total) by mouth daily. 30 tablet 3   atorvastatin  (LIPITOR) 10 MG tablet TAKE 1 TABLET(10 MG) BY MOUTH DAILY 90 tablet 3   azithromycin  (ZITHROMAX ) 250 MG tablet Take by  mouth.     desloratadine  (CLARINEX ) 5 MG tablet TAKE 1 TABLET(5 MG) BY MOUTH DAILY 90 tablet 1   dextromethorphan-guaiFENesin (MUCINEX DM) 30-600 MG 12hr tablet Take 1 tablet by mouth daily.     EPINEPHrine  0.3 mg/0.3 mL IJ SOAJ injection Inject 0.3 mg into the muscle as needed for anaphylaxis. USE AS DIRECTED FOR LIFE THREATENING ALLERGIC REACTIONS 2 each 1   famotidine  (PEPCID ) 40 MG tablet Take 1 tablet (40 mg total) by mouth at bedtime. Take 1 (ONE) tablet by mouth every evening. 90 tablet 1   fluticasone  (FLOVENT  HFA)  220 MCG/ACT inhaler Inhale 2 puffs into the lungs every 6 (six) hours as needed. Use with Albuterol  during flare ups. 36 g 1   gabapentin  (NEURONTIN ) 300 MG capsule TAKE 1 CAPSULE BY MOUTH IN THE MORNING, 1 CAPSULE AT LUNCH AND 2 CAPSULES AT BEDTIME 360 capsule 3   hydrOXYzine  (ATARAX ) 10 MG tablet Take 1 tablet (10 mg total) by mouth daily as needed for itching. 30 tablet 2   ipratropium (ATROVENT ) 0.06 % nasal spray Place 2 sprays into both nostrils 2 (two) times daily as needed for rhinitis. 45 mL 1   irbesartan  (AVAPRO ) 150 MG tablet TAKE 1 TABLET(150 MG) BY MOUTH DAILY 90 tablet 1   montelukast  (SINGULAIR ) 10 MG tablet Take 1 tablet (10 mg total) by mouth at bedtime. For asthma control. 90 tablet 1   neomycin (MYCIFRADIN) 500 MG tablet Take 500 mg by mouth 2 (two) times daily. 10 day course - start after completing metronidazole .     Olopatadine HCl (PATADAY OP) Place 1 drop into both eyes 2 (two) times daily as needed (eye irritation).     oxyCODONE  (OXY IR/ROXICODONE ) 5 MG immediate release tablet Take 1 tablet (5 mg total) by mouth every 6 (six) hours as needed. 20 tablet 0   pantoprazole  (PROTONIX ) 40 MG tablet Take 1 tablet (40 mg total) by mouth 2 (two) times daily. 180 tablet 1   Polyethyl Glycol-Propyl Glycol (SYSTANE OP) Place 1 drop into both eyes daily as needed (dry eyes).     polyethylene glycol powder (GLYCOLAX /MIRALAX ) 17 GM/SCOOP powder Take 17 g by mouth daily as needed (no BM in 24 hours).     rivaroxaban  (XARELTO ) 20 MG TABS tablet Take 1 tablet (20 mg total) by mouth daily with supper. 90 tablet 1   senna-docusate (SENOKOT-S) 8.6-50 MG tablet Take 2 tablets by mouth at bedtime as needed for moderate constipation. 60 tablet 0   traMADol  (ULTRAM ) 50 MG tablet Take 50 mg by mouth every 6 (six) hours as needed (pain).     Vibegron  (GEMTESA ) 75 MG TABS 1 tablet daily 30 tablet 0   Empagliflozin -linaGLIPtin  (GLYXAMBI ) 10-5 MG TABS Take 1 tablet by mouth daily. 90 tablet 1    methimazole  (TAPAZOLE ) 5 MG tablet Take 1 tablet (5 mg total) by mouth daily. 90 tablet 1   estradiol  (ESTRACE ) 0.01 % CREA vaginal cream APPLY 0.5 GRAMS OF CREAM INTRAVAGINALLY DAILY FOR 2 WEEKS THEN REDUCE TO TWICE WEEKLY     Facility-Administered Medications Prior to Visit  Medication Dose Route Frequency Provider Last Rate Last Admin   tezepelumab -ekko (TEZSPIRE ) 210 MG/1. syringe 210 mg  210 mg Subcutaneous Q28 days Kozlow, Eric J, MD   210 mg at 05/12/24 1006     Per HPI unless specifically indicated in ROS section below Review of Systems  Constitutional:  Negative for fatigue and fever.  HENT:  Negative for congestion.   Eyes:  Negative for  pain.  Respiratory:  Negative for cough and shortness of breath.   Cardiovascular:  Negative for chest pain, palpitations and leg swelling.  Gastrointestinal:  Negative for abdominal pain.  Genitourinary:  Negative for dysuria and vaginal bleeding.  Musculoskeletal:  Negative for back pain.  Neurological:  Negative for syncope, light-headedness and headaches.  Psychiatric/Behavioral:  Negative for dysphoric mood.    Objective:  BP 130/82   Pulse 63   Temp 97.9 F (36.6 C) (Oral)   Ht 5' 3.5 (1.613 m)   Wt 197 lb 6 oz (89.5 kg)   SpO2 98%   BMI 34.41 kg/m   Wt Readings from Last 3 Encounters:  05/19/24 195 lb (88.5 kg)  05/04/24 194 lb 6.4 oz (88.2 kg)  04/30/24 197 lb 6 oz (89.5 kg)      Physical Exam Constitutional:      General: She is not in acute distress.    Appearance: Normal appearance. She is well-developed. She is obese. She is not ill-appearing or toxic-appearing.  HENT:     Head: Normocephalic.     Right Ear: Hearing, tympanic membrane, ear canal and external ear normal. Tympanic membrane is not erythematous, retracted or bulging.     Left Ear: Hearing, tympanic membrane, ear canal and external ear normal. Tympanic membrane is not erythematous, retracted or bulging.     Nose: No mucosal edema or rhinorrhea.      Right Sinus: No maxillary sinus tenderness or frontal sinus tenderness.     Left Sinus: No maxillary sinus tenderness or frontal sinus tenderness.     Mouth/Throat:     Pharynx: Uvula midline.  Eyes:     General: Lids are normal. Lids are everted, no foreign bodies appreciated.     Conjunctiva/sclera: Conjunctivae normal.     Pupils: Pupils are equal, round, and reactive to light.  Neck:     Thyroid : No thyroid  mass or thyromegaly.     Vascular: No carotid bruit.     Trachea: Trachea normal.  Cardiovascular:     Rate and Rhythm: Normal rate and regular rhythm.     Pulses: Normal pulses.     Heart sounds: Normal heart sounds, S1 normal and S2 normal. No murmur heard.    No friction rub. No gallop.  Pulmonary:     Effort: Pulmonary effort is normal. No tachypnea or respiratory distress.     Breath sounds: Normal breath sounds. No decreased breath sounds, wheezing, rhonchi or rales.  Abdominal:     General: Bowel sounds are normal.     Palpations: Abdomen is soft.     Tenderness: There is no abdominal tenderness.  Musculoskeletal:     Cervical back: Normal range of motion and neck supple.  Skin:    General: Skin is warm and dry.     Findings: No rash.  Neurological:     Mental Status: She is alert.  Psychiatric:        Mood and Affect: Mood is not anxious or depressed.        Speech: Speech normal.        Behavior: Behavior normal. Behavior is cooperative.        Thought Content: Thought content normal.        Judgment: Judgment normal.       Results for orders placed or performed during the hospital encounter of 04/27/24  Basic metabolic panel   Collection Time: 04/27/24  1:01 PM  Result Value Ref Range   Sodium 141 135 - 145  mmol/L   Potassium 3.7 3.5 - 5.1 mmol/L   Chloride 103 98 - 111 mmol/L   CO2 27 22 - 32 mmol/L   Glucose, Bld 103 (H) 70 - 99 mg/dL   BUN 6 (L) 8 - 23 mg/dL   Creatinine, Ser 9.18 0.44 - 1.00 mg/dL   Calcium  9.4 8.9 - 10.3 mg/dL   GFR, Estimated  >39 >39 mL/min   Anion gap 11 5 - 15  CBC   Collection Time: 04/27/24  1:01 PM  Result Value Ref Range   WBC 7.8 4.0 - 10.5 K/uL   RBC 4.31 3.87 - 5.11 MIL/uL   Hemoglobin 11.8 (L) 12.0 - 15.0 g/dL   HCT 65.5 (L) 63.9 - 53.9 %   MCV 79.8 (L) 80.0 - 100.0 fL   MCH 27.4 26.0 - 34.0 pg   MCHC 34.3 30.0 - 36.0 g/dL   RDW 84.6 88.4 - 84.4 %   Platelets 155 150 - 400 K/uL   nRBC 0.3 (H) 0.0 - 0.2 %  Troponin T, High Sensitivity   Collection Time: 04/27/24  1:01 PM  Result Value Ref Range   Troponin T High Sensitivity 20 (H) 0 - 19 ng/L  Troponin T, High Sensitivity   Collection Time: 04/27/24  5:15 PM  Result Value Ref Range   Troponin T High Sensitivity 17 0 - 19 ng/L   *Note: Due to a large number of results and/or encounters for the requested time period, some results have not been displayed. A complete set of results can be found in Results Review.    Assessment and Plan  Status post amputation of lesser toe of right foot Assessment & Plan: Acute, healing well.  Encouraged follow-up with orthopedic as planned.   Type 2 diabetes mellitus with diabetic neuropathy, unspecified whether long term insulin  use (HCC) Assessment & Plan: Chronic, well controlled with diet despite recent surgery She will continue work on low sugar low carbohydrate diet.  Orders: -     Glucose Blood; Use to check blood sugar up to 2 times a day  Dispense: 100 each; Refill: 12  Gastroesophageal reflux disease, unspecified whether esophagitis present Assessment & Plan: Acute, poor control but patient now on pantoprazole  as well as Pepcid .  She is using reflux Gourmet to coat esophagus.  She has follow-up with GI for further evaluation.     No follow-ups on file.   Greig Ring, MD

## 2024-05-04 ENCOUNTER — Ambulatory Visit (INDEPENDENT_AMBULATORY_CARE_PROVIDER_SITE_OTHER): Payer: Medicare Other | Admitting: Internal Medicine

## 2024-05-04 ENCOUNTER — Other Ambulatory Visit: Payer: Self-pay

## 2024-05-04 ENCOUNTER — Other Ambulatory Visit

## 2024-05-04 ENCOUNTER — Telehealth: Payer: Self-pay | Admitting: *Deleted

## 2024-05-04 ENCOUNTER — Encounter: Payer: Self-pay | Admitting: Internal Medicine

## 2024-05-04 VITALS — BP 120/64 | Ht 63.5 in | Wt 194.4 lb

## 2024-05-04 DIAGNOSIS — E0500 Thyrotoxicosis with diffuse goiter without thyrotoxic crisis or storm: Secondary | ICD-10-CM

## 2024-05-04 DIAGNOSIS — E041 Nontoxic single thyroid nodule: Secondary | ICD-10-CM

## 2024-05-04 LAB — T3, FREE: T3, Free: 2.6 pg/mL (ref 2.3–4.2)

## 2024-05-04 LAB — TSH: TSH: 3.5 m[IU]/L (ref 0.40–4.50)

## 2024-05-04 LAB — T4, FREE: Free T4: 1.3 ng/dL (ref 0.8–1.8)

## 2024-05-04 NOTE — Patient Instructions (Signed)
 For now, continue 5 mg Methimazole .  Please stop at the lab.  Please come back for a follow-up appointment in 1 year but for labs in 6 months.

## 2024-05-04 NOTE — Telephone Encounter (Signed)
 Copied from CRM #[id number]. Topic: General - Other >> May 04, 2024  1:50 PM [name] wrote: Reason for CRM: patient would like a call back from [facility] to discuss flu shot; patient would like to clarify on which flu vaccine she received last year that didn't result in a reaction.    RA#[phone]

## 2024-05-04 NOTE — Progress Notes (Signed)
 Patient ID: [name], female   DOB: May 03, 1946, 78 y.o.   MRN: [id number]   HPI  [name] is a 78 y.o.-year-old female, returning for f/u for for thyrotoxicosis and thyroid  nodule.  She previously saw another endocrinologist (Dr. Loetta), but only for her diabetes. She is the sister of [name], also my pt. Last OV with me 1 year ago.  Interim history: She denies tremors, palpitations, heat intolerance. Since last visit, she developed osteomyelitis and had to have a R 3rd toe amputation. She is in a boot. She has hoarseness, and had a pill stuck in her throat since last visit.  She had investigation for this and she has mild pharyngeal dysphagia.  Reviewed and addended history: Patient was found to be thyrotoxic in 08/2015 but she reports that she was only informed about this in 2018.  Thyroid  scan indicated possible Graves' disease and also a cold left thyroid  nodule.    At that time, she was recommended surgery but she did not want to proceed with this and started to see me.    She was started on methimazole  initially 10 mg daily.  Doses were changed afterwards:  Most recently: In 09/2018 we decreased the dose of methimazole  to 2.5 mg daily.   In 04/2020, we stopped methimazole  In 05/2020, we restarted methimazole  5 mg daily (she misunderstood instructions to only start 2.5 mg daily) In 10/2020, we decreased the methimazole  to 2.5 mg daily In 12/2020, we decreased methimazole  to 2.5 mg every other day In 01/2021, we stopped methimazole  In 05/2021, we restarted methimazole  5 mg daily In 04/2022, we reduced methimazole  to 2.5 mg daily In 01/2023, we reduced methimazole  to 5 mg twice a week In 03/2023, we stopped methimazole  In 10/2023, we restarted MMI 5 mg daily  Her left thyroid  nodule was biopsied with benign results in 2019.    Reviewed her TFTs: Lab Results  Component Value Date   TSH 4.23 01/31/2024   TSH 0.17 (L) 11/04/2023   TSH 0.58 05/07/2023   TSH  3.45 03/19/2023   TSH 3.83 01/29/2023   TSH 2.96 06/20/2022   TSH 4.87 05/01/2022   TSH 0.64 10/31/2021   TSH 0.71 06/27/2021   TSH 0.13 (L) 05/30/2021   FREET4 1.3 11/04/2023   FREET4 1.11 05/07/2023   FREET4 1.02 03/19/2023   FREET4 1.19 01/29/2023   FREET4 0.77 06/20/2022   FREET4 0.75 05/01/2022   FREET4 1.39 10/31/2021   FREET4 0.99 06/27/2021   FREET4 1.49 05/30/2021   FREET4 1.09 05/02/2021   T3FREE 3.6 11/04/2023   T3FREE 2.8 05/07/2023   T3FREE 3.3 03/19/2023   T3FREE 3.2 01/29/2023   T3FREE 2.8 06/20/2022   T3FREE 2.6 05/01/2022   T3FREE 2.7 10/31/2021   T3FREE 3.3 06/27/2021   T3FREE 3.0 05/30/2021   T3FREE 2.6 05/02/2021  09/29/2021: TSH 5.81 (0.34-5.66)  Thyroid  uptake and scan (10/31/2017): Increased uptake of 40%, and cold left inferior pole nodule.  Thyroid  U/S (11/12/2017): 3.2 cm left inferior TR 3 nodule correlates with the cold nodule by nuclear medicine scan. This nodule meets criteria for biopsy as above.  FNA of her left thyroid  nodule (12/04/2017): Benign  Thyroid  U/S (10/23/2019): Slightly enlarged gland but not due to enlargement of the thyroid  nodule. It is possible that this is due to more inflammation. Parenchymal Echotexture: Mildly heterogenous Isthmus: Normal in size measuring 0.6 cm in diameter, unchanged Right lobe: Normal in size measuring 5.3 x 1.4 x 1.6 cm, previously, 4.1 x 1.2 x  1.5 cm Left lobe: Enlarged measuring 6.9 x 3.9 x 2.4 cm, previously, 6.1 x 2.1 x 2.3 cm. ___________________________________________________________   The previously biopsied approximately 3.0 x 2.7 x 1.9 cm isoechoic ill-defined nodule within the mid/inferior aspect of the left lobe of the thyroid  is grossly unchanged in size compared to the 10/2017 examination, previously, 3.2 x 2.5 x 2.0 cm. Correlation with previous biopsy results is advised.   IMPRESSION: 1. Similar appearing mildly heterogeneous and borderline enlarged thyroid  gland without worrisome  new or enlarging thyroid  nodule. 2. Previously biopsied nodule within the mid/inferior aspect the left lobe of the thyroid  is grossly unchanged compared to the 10/2017 examination. Correlation with previous biopsy results is advised. Assuming a benign pathologic diagnosis, repeat sampling and/or continued dedicated follow-up is not recommend  Ba swallow ((02/22/2023): 1. Mild-to-moderate esophageal dysmotility. 2. No evidence of esophageal mass, stricture or ulceration. No reflux elicited. 3. Laryngeal penetration without aspiration.  Ba swallow (10/03/2023): Mild-to-moderate esophageal dysmotility. Otherwise unremarkable exam. Swallow eval: Trace aspiration with thin liquid consistency only.   Her Graves' antibodies were elevated: Lab Results  Component Value Date   TSI 377 (H) 05/07/2023   TSI 485 (H) 05/30/2021   TSI 376 (H) 04/26/2020   TSI 331 (H) 11/03/2019   TSI 395 (H) 04/06/2019   TSI 361 (H) 02/06/2018   Pt denies: - feeling nodules in neck - hoarseness - dysphagia Rarely choking, with drier foods. She has to chew the food well.  No FH of thyroid  disease or cancer. No h/o radiation tx to head or neck. No current biotin use. No recent steroids use.    She also has a history of type 2 diabetes - well controlled - managed by PCP: Lab Results  Component Value Date   HGBA1C 5.6 04/17/2024   She has asthma, on 2 inhalers. Sees Dr. Kozlow. She had cardioversion on 11/23/2020 - A. Fib with SOB.  She fell in 07/2021 >> concussion.  She had a R TKR in 10/2021. She had cholecystectomy in 12/2021.  ROS:  + See HPI  I reviewed pt's medications, allergies, PMH, social hx, family hx, and changes were documented in the history of present illness. Otherwise, unchanged from my initial visit note.  Past Medical History:  Diagnosis Date   Allergic rhinitis    Allergy    Arthritis    Asthma    Breast cancer (HCC)    Chronic headache    Diabetes mellitus    Ductal  carcinoma in situ (DCIS) of left breast 02/23/2022   DVT (deep venous thrombosis) (HCC)    Dyspnea    Heart murmur    History of radiation therapy    Left Breast 05/02/22-05/30/22- Dr. Lynwood Nasuti   Hypertension    Penicillin allergy 01/19/2020   Pulmonary embolism (HCC)    Pulmonary hypertension (HCC)    Seizures (HCC)    Past Surgical History:  Procedure Laterality Date   ABDOMINAL HYSTERECTOMY     partial, has ovaries   AMPUTATION TOE Right 04/22/2024   Procedure: AMPUTATION, TOE;  Surgeon: Malvin Marsa FALCON, DPM;  Location: [facility] OR;  Service: Orthopedics/Podiatry;  Laterality: Right;  RIGHT 3RD TOE AMPUTATION   BREAST LUMPECTOMY WITH RADIOACTIVE SEED LOCALIZATION Left 03/29/2022   Procedure: LEFT BREAST LUMPECTOMY WITH RADIOACTIVE SEED LOCALIZATION;  Surgeon: Vanderbilt Ned, MD;  Location: [facility] OR;  Service: General;  Laterality: Left;   CARDIAC CATHETERIZATION  12/28/2010   Mod. pulmonary hypertension, normal coronary arteries   CARDIOVERSION N/A 11/23/2020   Procedure:  CARDIOVERSION;  Surgeon: Francyne Headland, MD;  Location: [facility] ENDOSCOPY;  Service: Cardiovascular;  Laterality: N/A;   CHOLECYSTECTOMY N/A 12/20/2021   Procedure: LAPAROSCOPIC CHOLECYSTECTOMY WITH INTRAOPERATIVE CHOLANGIOGRAM;  Surgeon: Eletha Boas, MD;  Location: [facility] ORS;  Service: General;  Laterality: N/A;   DOPPLER ECHOCARDIOGRAPHY  10/08/2011   EF=>55%,mild asymmetric LVH, mod. TR, mod. PH, mild to mod LA dilatation   ESOPHAGEAL MANOMETRY N/A 06/19/2023   Procedure: ESOPHAGEAL MANOMETRY (EM);  Surgeon: Saintclair Jasper, MD;  Location: [facility] END[facility];  Service: Gastroenterology;  Laterality: N/A;   IR LUMBAR DISC ASPIRATION W/IMG GUIDE  01/12/2020   KNEE ARTHROSCOPY Left    KNEE SURGERY     LOWER EXTREMITY ANGIOGRAPHY N/A 04/20/2024   Procedure: Lower Extremity Angiography;  Surgeon: Pearline Norman RAMAN, MD;  Location: [facility] INVASIVE CV LAB;  Service: Cardiovascular;  Laterality: N/A;   Nuclear Stress Test  05/20/2006   No  ischemia   PARTIAL HYSTERECTOMY     PLANTAR FASCIA SURGERY     TONSILLECTOMY     Social History   Socioeconomic History   Marital status: Widowed    Spouse name: Not on file   Number of children: Y   Years of education: Not on file   Highest education level: Not on file  Occupational History   Occupation: retired Programme researcher, broadcasting/film/video.   Tobacco Use   Smoking status: Never    Passive exposure: Never   Smokeless tobacco: Never  Vaping Use   Vaping status: Never Used  Substance and Sexual Activity   Alcohol  use: Not Currently    Alcohol /week: 0.0 standard drinks of alcohol     Comment: occ glass on wine   Drug use: No   Sexual activity: Never  Other Topics Concern   Not on file  Social History Narrative   Widow    limited exercise.   Social Drivers of Corporate investment banker Strain: Low Risk  (05/16/2023)   Overall Financial Resource Strain (CARDIA)    Difficulty of Paying Living Expenses: Not hard at all  Food Insecurity: No Food Insecurity (04/29/2024)   Hunger Vital Sign    Worried About Running Out of Food in the Last Year: Never true    Ran Out of Food in the Last Year: Never true  Transportation Needs: No Transportation Needs (04/29/2024)   PRAPARE - Administrator, Civil Service (Medical): No    Lack of Transportation (Non-Medical): No  Physical Activity: Insufficiently Active (05/16/2023)   Exercise Vital Sign    Days of Exercise per Week: 2 days    Minutes of Exercise per Session: 30 min  Stress: No Stress Concern Present (05/16/2023)   [employer] of Occupational Health - Occupational Stress Questionnaire    Feeling of Stress : Only a little  Social Connections: Moderately Isolated (04/17/2024)   Social Connection and Isolation Panel    Frequency of Communication with Friends and Family: More than three times a week    Frequency of Social Gatherings with Friends and Family: Once a week    Attends Religious Services: More than 4 times per  year    Active Member of [employer] or Organizations: No    Attends Banker Meetings: Never    Marital Status: Widowed  Intimate Partner Violence: Not At Risk (04/29/2024)   Humiliation, Afraid, Rape, and Kick questionnaire    Fear of Current or Ex-Partner: No    Emotionally Abused: No    Physically Abused: No    Sexually Abused: No  Current Outpatient Medications on File Prior to Visit  Medication Sig Dispense Refill   acetaminophen  (TYLENOL ) 500 MG tablet Take 1,000 mg by mouth 2 (two) times daily as needed for fever or headache (pain).     ADVAIR  HFA 230-21 MCG/ACT inhaler Inhale 2 puffs into the lungs 2 (two) times daily. On empty lungs 36 g 1   albuterol  (PROVENTIL ) (2.5 MG/3ML) 0.083% nebulizer solution Take 3 mLs (2.5 mg total) by nebulization every 6 (six) hours as needed for wheezing or shortness of breath. 150 mL 1   albuterol  (VENTOLIN  HFA) 108 (90 Base) MCG/ACT inhaler Inhale 2 puffs into the lungs every 6 (six) hours as needed for wheezing or shortness of breath (Take with Flovent  During Flare Ups.). (Patient taking differently: Inhale 2 puffs into the lungs See admin instructions. Inhale 2 puffs every 6 hours as needed for wheezing, shortness of breath. While out of Advair , use 2 puffs every morning and use Flovent  at night.) 18 g 1   anastrozole  (ARIMIDEX ) 1 MG tablet Take 1 tablet (1 mg total) by mouth daily. 30 tablet 3   atorvastatin  (LIPITOR) 10 MG tablet TAKE 1 TABLET(10 MG) BY MOUTH DAILY 90 tablet 3   azithromycin  (ZITHROMAX ) 250 MG tablet Take by mouth.     desloratadine  (CLARINEX ) 5 MG tablet TAKE 1 TABLET(5 MG) BY MOUTH DAILY 90 tablet 1   dextromethorphan-guaiFENesin (MUCINEX DM) 30-600 MG 12hr tablet Take 1 tablet by mouth daily.     Empagliflozin -linaGLIPtin  (GLYXAMBI ) 10-5 MG TABS Take 1 tablet by mouth daily. 90 tablet 1   EPINEPHrine  0.3 mg/0.3 mL IJ SOAJ injection Inject 0.3 mg into the muscle as needed for anaphylaxis. USE AS DIRECTED FOR LIFE  THREATENING ALLERGIC REACTIONS 2 each 1   estradiol  (ESTRACE ) 0.01 % CREA vaginal cream APPLY 0.5 GRAMS OF CREAM INTRAVAGINALLY DAILY FOR 2 WEEKS THEN REDUCE TO TWICE WEEKLY     famotidine  (PEPCID ) 40 MG tablet Take 1 tablet (40 mg total) by mouth at bedtime. Take 1 (ONE) tablet by mouth every evening. 90 tablet 1   fluticasone  (FLOVENT  HFA) 220 MCG/ACT inhaler Inhale 2 puffs into the lungs every 6 (six) hours as needed. Use with Albuterol  during flare ups. 36 g 1   gabapentin  (NEURONTIN ) 300 MG capsule TAKE 1 CAPSULE BY MOUTH IN THE MORNING, 1 CAPSULE AT LUNCH AND 2 CAPSULES AT BEDTIME 360 capsule 3   glucose blood test strip Use to check blood sugar up to 2 times a day 100 each 12   hydrOXYzine  (ATARAX ) 10 MG tablet Take 1 tablet (10 mg total) by mouth daily as needed for itching. 30 tablet 2   ipratropium (ATROVENT ) 0.06 % nasal spray Place 2 sprays into both nostrils 2 (two) times daily as needed for rhinitis. 45 mL 1   irbesartan  (AVAPRO ) 150 MG tablet TAKE 1 TABLET(150 MG) BY MOUTH DAILY 90 tablet 1   methimazole  (TAPAZOLE ) 5 MG tablet Take 1 tablet (5 mg total) by mouth daily. 90 tablet 1   montelukast  (SINGULAIR ) 10 MG tablet Take 1 tablet (10 mg total) by mouth at bedtime. For asthma control. 90 tablet 1   neomycin (MYCIFRADIN) 500 MG tablet Take 500 mg by mouth 2 (two) times daily. 10 day course - start after completing metronidazole .     Olopatadine HCl (PATADAY OP) Place 1 drop into both eyes 2 (two) times daily as needed (eye irritation).     oxyCODONE  (OXY IR/ROXICODONE ) 5 MG immediate release tablet Take 1 tablet (5 mg total) by  mouth every 6 (six) hours as needed. 20 tablet 0   pantoprazole  (PROTONIX ) 40 MG tablet Take 1 tablet (40 mg total) by mouth 2 (two) times daily. 180 tablet 1   Polyethyl Glycol-Propyl Glycol (SYSTANE OP) Place 1 drop into both eyes daily as needed (dry eyes).     polyethylene glycol powder (GLYCOLAX /MIRALAX ) 17 GM/SCOOP powder Take 17 g by mouth daily as  needed (no BM in 24 hours).     rivaroxaban  (XARELTO ) 20 MG TABS tablet Take 1 tablet (20 mg total) by mouth daily with supper. 90 tablet 1   senna-docusate (SENOKOT-S) 8.6-50 MG tablet Take 2 tablets by mouth at bedtime as needed for moderate constipation. 60 tablet 0   traMADol  (ULTRAM ) 50 MG tablet Take 50 mg by mouth every 6 (six) hours as needed (pain).     Vibegron  (GEMTESA ) 75 MG TABS 1 tablet daily 30 tablet 0   Current Facility-Administered Medications on File Prior to Visit  Medication Dose Route Frequency Provider Last Rate Last Admin   tezepelumab -ekko (TEZSPIRE ) 210 MG/1. syringe 210 mg  210 mg Subcutaneous Q28 days Maurilio Camellia PARAS, MD   210 mg at 04/14/24 1050   Allergies  Allergen Reactions   Dilantin [Phenytoin] Swelling    Facial swelling   Latex Hives   Penicillins Nausea And Vomiting and Swelling   Roxicodone  [Oxycodone ] Nausea And Vomiting and Other (See Comments)    Abdominal pain   Codeine  Nausea Only    Tolerates Tylenol  with codeine  combination   Hydrocodone  Nausea And Vomiting   Lyrica [Pregabalin] Itching and Other (See Comments)    Headaches Vision changes   Nickel Hives and Rash   Family History  Problem Relation Age of Onset   Cancer Mother        breast and colon cancer   Hypertension Mother    Clotting disorder Mother    Breast cancer Mother 47   Arthritis Mother    Stroke Mother    Diabetes Mother    Colon cancer Mother 107 - 71   Alcohol  abuse Father    Cancer Sister        breast cancer   Multiple sclerosis Sister    Cancer Sister        colon cancer   Cancer Brother        colon cancer   Prostate cancer Half-Brother    Stomach cancer Half-Brother    Breast cancer Half-Sister 51       recurrence at 71, reports positive genetic testing   Arthritis Half-Sister    Diabetes Half-Sister    Colon cancer Half-Sister 28   Allergies Other        grandson   Allergic rhinitis Neg Hx    Angioedema Neg Hx    Asthma Neg Hx    Atopy Neg Hx     Eczema Neg Hx    Immunodeficiency Neg Hx    Urticaria Neg Hx    PE: BP 120/64   Ht 5' 3.5 (1.613 m)   Wt 194 lb 6.4 oz (88.2 kg)   BMI 33.90 kg/m  Wt Readings from Last 10 Encounters:  05/04/24 194 lb 6.4 oz (88.2 kg)  04/30/24 197 lb 6 oz (89.5 kg)  04/22/24 199 lb 1.2 oz (90.3 kg)  03/20/24 194 lb 8 oz (88.2 kg)  03/17/24 195 lb 14.4 oz (88.9 kg)  03/06/24 201 lb (91.2 kg)  01/31/24 197 lb 6 oz (89.5 kg)  01/07/24 193 lb (87.5 kg)  11/29/23 194  lb 6 oz (88.2 kg)  11/27/23 194 lb 6.4 oz (88.2 kg)   Constitutional: overweight, in NAD, walks with a cane Eyes:  EOMI, no exophthalmos ENT: no neck masses, no cervical lymphadenopathy Cardiovascular: RRR, No MRG Respiratory: CTA B Musculoskeletal: no deformities, R foot in boot Skin:no rashes Neurological: no tremor with outstretched hands  ASSESSMENT: 1.  Graves' disease  2.  Left thyroid  nodule  PLAN:  1. Patient with history of thyrotoxicosis consistent with Graves' disease.  She initially had fatigue, heat intolerance, insomnia, but no palpitations, anxiety, hyperdefecation.  The thyroid  uptake and scan confirmed increased uptake.  TSI antibodies are related.  She was started on methimazole  and we were able to titrate the dose down.  In 04/2020, we tried to stop methimazole  but we had to restart this a month later.  In 01/2021, we again tried to stop the methimazole  and we had to restart it in 05/2021.  We tapered the methimazole  down and again stopped completely 03/2023. We had to restart MMI 5 mg daily in 10/2023. At last check TFTs were normal.  Her TSI's were elevated at last check. - At today's visit, she has no thyrotoxic signs or symptoms  - We will recheck her TFTs today and see if we need to change the dose of her methimazole .  If we do, we need to do a very slow taper if her TSH trends up - I will see her back in 1 year but with labs in 6 months  2.  Left thyroid  nodule -Appearing as cold on the thyroid   scan -Of note, this nodule was biopsied in 11/2017 with benign results - Latest ultrasound is from 2021: Nodule was stable in size and no imaging follow-up was needed. -She has occasional choking with food which is chronic.  She usually needs to chew her food very well.  She had a barium swallow last year, which showed mild to moderate esophageal dysmotility but no esophageal mass, stricture, ulceration, or external compression. She had another Ba swallow and swallow eval. 09/2023 >> same results, + mild aspiration with liquids - Will continue to monitor her clinically  + needs refills  Lela Fendt, MD PhD [facility]

## 2024-05-04 NOTE — Telephone Encounter (Signed)
 Ms. Glantz notified by telephone that last year she got the Fluad Trivalent HD.

## 2024-05-05 ENCOUNTER — Ambulatory Visit: Admitting: Podiatry

## 2024-05-05 ENCOUNTER — Ambulatory Visit: Payer: Self-pay | Admitting: Internal Medicine

## 2024-05-05 ENCOUNTER — Telehealth: Payer: Self-pay | Admitting: *Deleted

## 2024-05-05 DIAGNOSIS — M86171 Other acute osteomyelitis, right ankle and foot: Secondary | ICD-10-CM

## 2024-05-05 DIAGNOSIS — E1140 Type 2 diabetes mellitus with diabetic neuropathy, unspecified: Secondary | ICD-10-CM

## 2024-05-05 DIAGNOSIS — Z23 Encounter for immunization: Secondary | ICD-10-CM | POA: Diagnosis not present

## 2024-05-05 DIAGNOSIS — Z9889 Other specified postprocedural states: Secondary | ICD-10-CM | POA: Diagnosis not present

## 2024-05-05 MED ORDER — METHIMAZOLE 5 MG PO TABS: 2.5000 mg | ORAL_TABLET | Freq: Every day | ORAL | 3 refills | Status: AC

## 2024-05-05 NOTE — Telephone Encounter (Signed)
-----   Message from Harlene Du sent at 05/05/2024  3:52 PM EDT ----- Regarding: Lab Fri 05/15/24 Hello,  Patient is coming in for CPE labs on Friday 05/15/24. Can we get orders please.   Thanks

## 2024-05-05 NOTE — Telephone Encounter (Signed)
 Patient in hospital recent.  The only thing I see that the patient needs is a Microalbumin.  Please advise.

## 2024-05-05 NOTE — Progress Notes (Signed)
  Subjective:  Patient ID: [name], female    DOB: 09-18-1945,  MRN: [id number]  No chief complaint on file.   DOS: 04/22/2024 Procedure: Amputation of right third toe at MPJ level  78 y.o. female seen for post op check.  Patient following up 2 week after surgery.  Doing well reports no pain.  Walking in postop shoe.   No concerns.   Review of Systems: Negative except as noted in the HPI. Denies N/V/F/Ch.   Objective:   Constitutional Well developed. Well nourished.  Vascular Foot warm and well perfused. Capillary refill normal to all digits.   No calf pain with palpation  Neurologic Normal speech. Oriented to person, place, and time. Epicritic sensation diminished to right foot  Dermatologic Amputation site right third toe well coapted no maceration no drainage or dehiscence, improved from prior   Orthopedic: Status post right third toe amputation MPJ level with postop shoe in place   Radiographs: Interval resection of the third toe. Expected postoperative changes in the soft tissues.  Pathology:  A. RIGHT THIRD TOE, AMPUTATION:  Reactive cortical and trabecular bone with associated fibrosis and  chronic inflammation compatible with chronic osteomyelitis  Overlying hyperkeratotic skin showing acute inflammation  Proximal skin, soft tissue and bone margin grossly and histologically  free of inflammation   Micro: No growth seen aerobically or anaerobically  Assessment:   1. Acute osteomyelitis of toe of right foot (HCC)   2. Post-operative state     Osteomyelitis of right third toe status post amputation at MPJ level  Plan:  Patient was evaluated and treated and all questions answered.  2 week s/p right third toe amputation MPJ level -Progressing as expected postoperatively, amputation site healing well no dehiscence drainage or maceration no evidence of residual infection -XR: Expected postop findings -WB Status: Weightbearing as tolerated in postop  shoe -Sutures: Removed today -Medications/ABX: No antibiotics indicated -Dressing: Okay to get the foot wet and wash and shower now and then apply antibiotic ointment Band-Aid style dressing - F/u Plan: 2 weeks for final recheck        [name], DPM [facility] / [facility]

## 2024-05-05 NOTE — Addendum Note (Signed)
 Addended by: TRIXIE FILE on: 05/05/2024 12:07 PM   Modules accepted: Orders

## 2024-05-06 ENCOUNTER — Telehealth: Payer: Self-pay

## 2024-05-06 NOTE — Telephone Encounter (Signed)
 Left message for [name] that she does not need to come in for pre physical labs due to all those labs where done while she was in the hosptial.  Lab appointment cancelled.

## 2024-05-06 NOTE — Patient Instructions (Signed)
 Visit Information  Thank you for taking time to visit with me today. Please don't hesitate to contact me if I can be of assistance to you before our next scheduled telephone appointment.  Our next appointment is by telephone on 05/13/24 in the morning   Following is a copy of your care plan:   Goals Addressed             This Visit's Progress    VBCI Transitions of Care (TOC) Care Plan       Problems:  Recent Hospitalization for treatment of Admit/Discharge Date   10/3 - 04/23/24   Primary Diagnosis: Diabetic foot infection s/p amputation on 10/8 right 3rd toe Patient has not been checking her sugar - agrees to start cchecking  Goal:  Over the next 30 days, the patient will not experience hospital readmission  Interventions:  Transitions of Care: Doctor Visits  - discussed the importance of doctor visits Reviewed Signs and symptoms of infection Educated on importance of monitoring blood sugar and diabetic diet Reviewed outcome of her 04/28/24 podiatry appt and discussed next appt 05/05/24 Discussed 05/04/24 1 year appointment with endo: Tawni Fendt - patient was not aware of appt  Update 05/06/24: Reviewed recent MD appts including: podiatry, Malvin Marsa FALCON, DPM 05/05/24 - Post-op appt - Acute osteomyelitis of toe of right foot - sutures removed Okay to get the foot wet and wash and shower now and then apply antibiotic ointment Band-Aid style dressing - weight bearing as tolerated in post op shoe- f/u 2 weeks - 05/19/24 05/04/24 labs endo appt Dr Fendt - Patient reports last A1C 5.6 - Patient states she discussed swallowing issue with Endo and will continue to swallow pills with applesauce and states MD following patient mostly related to thyroid . Patient states she has just about completed the neomycin and feels she is doing well - agreed to f/u call next week   Diabetes Interventions: Assessed patient's understanding of A1c goal: maintain <6.0% Provided education  to patient about basic DM disease process Reviewed medications with patient and discussed importance of medication adherence Discussed plans with patient for ongoing care management follow up and provided patient with direct contact information for care management team Reviewed scheduled/upcoming provider appointments including: Dr Fendt 05/04/24 and PCP 04/30/24 Assessed social determinant of health barriers Lab Results  Component Value Date   HGBA1C 5.6 04/17/2024    Surgery (right 3rd toe amputation): Evaluation of current treatment plan related to right 3rd toe amputation surgery reviewed post-operative instructions with patient/caregiver reviewed medications with patient and addressed questions reviewed scheduled provider appointments with patient- next podiatry appt 05/05/24  Patient Self Care Activities:  Attend all scheduled provider appointments Call pharmacy for medication refills 3-7 days in advance of running out of medications Call provider office for new concerns or questions  Notify RN Care Manager of TOC call rescheduling needs Participate in Transition of Care Program/Attend TOC scheduled calls Take medications as prescribed   check feet daily for cuts, sores or redness enter blood sugar readings and medication or insulin  into daily log take the blood sugar log to all doctor visits wash and dry feet carefully every day - but keep right foot clean and dry wear comfortable, cotton socks wear comfortable, well-fitting shoes  Plan:  Telephone follow up appointment with care management team member scheduled for:  05/13/24 in the morning The patient has been provided with contact information for the care management team and has been advised to call with any health related  questions or concerns.         Patient verbalizes understanding of instructions and care plan provided today and agrees to view in MyChart. Active MyChart status and patient understanding of how to  access instructions and care plan via MyChart confirmed with patient.     Telephone follow up appointment with care management team member scheduled for:05/13/24 The patient has been provided with contact information for the care management team and has been advised to call with any health related questions or concerns.   Please call the care guide team at [phone] if you need to cancel or reschedule your appointment.   Please call the Suicide and Crisis Lifeline: 988 call 1-800-273-TALK (toll free, 24 hour hotline) call 911 if you are experiencing a Mental Health or Behavioral Health Crisis or need someone to talk to.  Shona Prow RN, CCM [place]  VBCI-Population Health RN Care Manager [phone]

## 2024-05-06 NOTE — Transitions of Care (Post Inpatient/ED Visit) (Signed)
 Transition of Care week 2  Visit Note  05/06/2024  Name: [name] MRN: [id number]          DOB: Jul 06, 1946  Situation: Patient enrolled in [facility] 30-day program. Visit completed with patient by telephone.   Background: Admit/Discharge Date   10/3 - 04/23/24   Primary Diagnosis: Diabetic foot infection s/p amputation on 10/8 right 3rd toe  Initial Transition Care Management Follow-up Telephone Call Discharge Date and Diagnosis: 04/23/24, diabetic foot infection   Past Medical History:  Diagnosis Date   Allergic rhinitis    Allergy    Arthritis    Asthma    Breast cancer (HCC)    Chronic headache    Diabetes mellitus    Ductal carcinoma in situ (DCIS) of left breast 02/23/2022   DVT (deep venous thrombosis) (HCC)    Dyspnea    Heart murmur    History of radiation therapy    Left Breast 05/02/22-05/30/22- Dr. Lynwood Nasuti   Hypertension    Penicillin allergy 01/19/2020   Pulmonary embolism (HCC)    Pulmonary hypertension (HCC)    Seizures (HCC)     Assessment: Patient Reported Symptoms: Cognitive Cognitive Status: No symptoms reported, Normal speech and language skills, Alert and oriented to person, place, and time      Neurological Neurological Review of Symptoms: No symptoms reported    HEENT HEENT Symptoms Reported: No symptoms reported (Patient reports she is chewing food better and not having issue now with swallowing) HEENT Comment: Patient states she discussed swallowing issue with Endo and will continue to swallow pills with applesauce    Cardiovascular Cardiovascular Symptoms Reported: No symptoms reported    Respiratory Respiratory Symptoms Reported: No symptoms reported Additional Respiratory Details: Patient states she doesn't use CPAP and feels she doesn't currently need it    Endocrine Endocrine Symptoms Reported: No symptoms reported Is patient diabetic?: Yes Is patient checking blood sugars at home?: Yes Endocrine Comment: Patient reports has  not been checking sugar daily but reports she has checked it twice and reports 90 something and 104  Gastrointestinal Gastrointestinal Symptoms Reported: Reflux/heartburn Gastrointestinal Management Strategies: Medication therapy Gastrointestinal Self-Management Outcome: 4 (good)    Genitourinary Genitourinary Symptoms Reported: Incontinence Genitourinary Management Strategies: Incontinence garment/pad Genitourinary Self-Management Outcome: 3 (uncertain)  Integumentary Integumentary Symptoms Reported: Wound Additional Integumentary Details: Patient saw podiatry and sutures were removed and patient confirms that Md feels wound is healing nicely - 3rd right toe amputation site Skin Self-Management Outcome: 4 (good)  Musculoskeletal          Psychosocial           There were no vitals filed for this visit.  Medications Reviewed Today     Reviewed by Lauro Shona LABOR, RN (Registered Nurse) on 05/06/24 at 1157  Med List Status: <None>   Medication Order Taking? Sig Documenting Provider Last Dose Status Informant  acetaminophen  (TYLENOL ) 500 MG tablet [id number] Yes Take 1,000 mg by mouth 2 (two) times daily as needed for fever or headache (pain). [provider]  Active Self, Pharmacy Records  ADVAIR  [facility] 230-21 MCG/ACT inhaler [id number] Yes Inhale 2 puffs into the lungs 2 (two) times daily. On empty lungs Kozlow, Eric J, MD  Active Self, Pharmacy Records  albuterol  (PROVENTIL ) (2.5 MG/3ML) 0.083% nebulizer solution [id number] Yes Take 3 mLs (2.5 mg total) by nebulization every 6 (six) hours as needed for wheezing or shortness of breath. Kozlow, Eric J, MD  Active Self, Pharmacy Records  albuterol  (VENTOLIN  HFA) 108 [id number]  Base) MCG/ACT inhaler [id number] Yes Inhale 2 puffs into the lungs every 6 (six) hours as needed for wheezing or shortness of breath (Take with Flovent  During Flare Ups.).  Patient taking differently: Inhale 2 puffs into the lungs See admin instructions. Inhale 2 puffs  every 6 hours as needed for wheezing, shortness of breath. While out of Advair , use 2 puffs every morning and use Flovent  at night.   Kozlow, Eric J, MD  Active Self, Pharmacy Records  anastrozole  (ARIMIDEX ) 1 MG tablet [id number] Yes Take 1 tablet (1 mg total) by mouth daily. Gudena, Vinay, MD  Active Self, Pharmacy Records  atorvastatin  (LIPITOR) 10 MG tablet [id number] Yes TAKE 1 TABLET(10 MG) BY MOUTH DAILY Avelina, Amy E, MD  Active Self, Pharmacy Records  azithromycin  (ZITHROMAX ) 250 MG tablet [id number]  Take by mouth. [provider]  Consider Medication Status and Discontinue (Completed Course)   desloratadine  (CLARINEX ) 5 MG tablet [id number] Yes TAKE 1 TABLET(5 MG) BY MOUTH DAILY Kozlow, Eric J, MD  Active Self, Pharmacy Records  dextromethorphan-guaiFENesin [facility] DM) 30-600 MG 12hr tablet [id number] Yes Take 1 tablet by mouth daily. [provider]  Active Self, Pharmacy Records  Empagliflozin -linaGLIPtin  (GLYXAMBI ) 10-5 MG TABS [id number] Yes Take 1 tablet by mouth daily. Avelina Greig BRAVO, MD  Active Self, Pharmacy Records  EPINEPHrine  0.3 mg/0.3 mL IJ SOAJ injection [id number] Yes Inject 0.3 mg into the muscle as needed for anaphylaxis. USE AS DIRECTED FOR LIFE THREATENING ALLERGIC REACTIONS Kozlow, Eric J, MD  Active Self, Pharmacy Records  estradiol  (ESTRACE ) 0.01 % CREA vaginal cream [id number]  APPLY 0.5 GRAMS OF CREAM INTRAVAGINALLY DAILY FOR 2 WEEKS THEN REDUCE TO TWICE WEEKLY  Patient not taking: Reported on 05/06/2024   [provider]  Active   famotidine  (PEPCID ) 40 MG tablet [id number] Yes Take 1 tablet (40 mg total) by mouth at bedtime. Take 1 (ONE) tablet by mouth every evening. Kozlow, Eric J, MD  Active Self, Pharmacy Records  fluticasone  (FLOVENT  [facility]) 220 MCG/ACT inhaler [id number] Yes Inhale 2 puffs into the lungs every 6 (six) hours as needed. Use with Albuterol  during flare ups. Kozlow, Eric J, MD  Active Self, Pharmacy Records  gabapentin  (NEURONTIN )  300 MG capsule [id number] Yes TAKE 1 CAPSULE BY MOUTH IN THE MORNING, 1 CAPSULE AT LUNCH AND 2 CAPSULES AT BEDTIME Hyatt, Max T, DPM  Active Self, Pharmacy Records  glucose blood test strip [id number] Yes Use to check blood sugar up to 2 times a day Bedsole, Amy E, MD  Active   hydrOXYzine  (ATARAX ) 10 MG tablet [id number] Yes Take 1 tablet (10 mg total) by mouth daily as needed for itching. Avelina Greig BRAVO, MD  Active Self, Pharmacy Records  ipratropium (ATROVENT ) 0.06 % nasal spray [id number] Yes Place 2 sprays into both nostrils 2 (two) times daily as needed for rhinitis. Kozlow, Eric J, MD  Active Self, Pharmacy Records  irbesartan  (AVAPRO ) 150 MG tablet [id number] Yes TAKE 1 TABLET(150 MG) BY MOUTH DAILY Croitoru, Mihai, MD  Active Self, Pharmacy Records  methimazole  (TAPAZOLE ) 5 MG tablet [id number] Yes Take 0.5 tablets (2.5 mg total) by mouth daily. Trixie File, MD  Active   montelukast  (SINGULAIR ) 10 MG tablet [id number] Yes Take 1 tablet (10 mg total) by mouth at bedtime. For asthma control. Kozlow, Eric J, MD  Active Self, Pharmacy Records  neomycin (MYCIFRADIN) 500 MG tablet [id number] Yes Take 500 mg by mouth 2 (two) times daily. 10 day course - start after completing metronidazole . [provider]  Active  Self, Pharmacy Records  Olopatadine HCl (PATADAY OP) [id number] Yes Place 1 drop into both eyes 2 (two) times daily as needed (eye irritation). [provider]  Active Self, Pharmacy Records  oxyCODONE  (OXY IR/ROXICODONE ) 5 MG immediate release tablet [id number]  Take 1 tablet (5 mg total) by mouth every 6 (six) hours as needed.  Patient not taking: Reported on 05/06/2024   Caleen Burgess BROCKS, MD  Active   pantoprazole  (PROTONIX ) 40 MG tablet [id number] Yes Take 1 tablet (40 mg total) by mouth 2 (two) times daily. Kozlow, Eric J, MD  Active Self, Pharmacy Records  Polyethyl Glycol-Propyl Glycol [place] [facility]) [id number] Yes Place 1 drop into both eyes daily as needed (dry eyes).  [provider]  Active Self, Pharmacy Records  polyethylene glycol powder (GLYCOLAX /MIRALAX ) 17 GM/SCOOP powder [id number] Yes Take 17 g by mouth daily as needed (no BM in 24 hours). [provider]  Active Self, Pharmacy Records  rivaroxaban  (XARELTO ) 20 MG TABS tablet [id number] Yes Take 1 tablet (20 mg total) by mouth daily with supper. Croitoru, Mihai, MD  Active Self, Pharmacy Records  senna-docusate (SENOKOT-S) 8.6-50 MG tablet [id number] Yes Take 2 tablets by mouth at bedtime as needed for moderate constipation. Caleen Burgess BROCKS, MD  Active   tezepelumab -ekko (TEZSPIRE ) 210 MG/1. syringe 210 mg [id number]   Kozlow, Eric J, MD  Active   traMADol  (ULTRAM ) 50 MG tablet [id number]  Take 50 mg by mouth every 6 (six) hours as needed (pain).  Patient not taking: Reported on 05/06/2024   [provider]  Active Self, Pharmacy Records  Vibegron  (GEMTESA ) 75 MG TABS [id number] Yes 1 tablet daily Avelina Greig BRAVO, MD  Active Self, Pharmacy Records            Recommendation:   Continue Current Plan of Care  Follow Up Plan:   Telephone follow up appointment date/time:  05/13/24 in the AM  Shona Prow RN, CCM [place]  VBCI-Population Health RN Care Manager [phone]

## 2024-05-07 ENCOUNTER — Other Ambulatory Visit: Payer: Self-pay | Admitting: Family Medicine

## 2024-05-07 DIAGNOSIS — E1140 Type 2 diabetes mellitus with diabetic neuropathy, unspecified: Secondary | ICD-10-CM

## 2024-05-12 ENCOUNTER — Ambulatory Visit (INDEPENDENT_AMBULATORY_CARE_PROVIDER_SITE_OTHER)

## 2024-05-12 DIAGNOSIS — J4550 Severe persistent asthma, uncomplicated: Secondary | ICD-10-CM | POA: Diagnosis not present

## 2024-05-13 ENCOUNTER — Telehealth: Payer: Self-pay

## 2024-05-13 NOTE — Transitions of Care (Post Inpatient/ED Visit) (Signed)
  Transition of Care week 4  Visit Note  05/13/2024  Name: [name] MRN: [id number]          DOB: September 19, 1945  Situation: Patient enrolled in [facility] 30-day program. Visit completed with patient by telephone.   Background: Admit/Discharge Date   10/3 - 04/23/24   Primary Diagnosis: Diabetic foot infection s/p amputation on 10/8 right 3rd toe  Initial Transition Care Management Follow-up Telephone Call Discharge Date and Diagnosis: 04/23/24, diabetic foot infection   Past Medical History:  Diagnosis Date   Allergic rhinitis    Allergy    Arthritis    Asthma    Breast cancer (HCC)    Chronic headache    Diabetes mellitus    Ductal carcinoma in situ (DCIS) of left breast 02/23/2022   DVT (deep venous thrombosis) (HCC)    Dyspnea    Heart murmur    History of radiation therapy    Left Breast 05/02/22-05/30/22- Dr. Lynwood Nasuti   Hypertension    Penicillin allergy 01/19/2020   Pulmonary embolism (HCC)    Pulmonary hypertension (HCC)    Seizures (HCC)     Assessment: Patient Reported Symptoms: Cognitive Cognitive Status: No symptoms reported, Normal speech and language skills, Alert and oriented to person, place, and time      Neurological Neurological Review of Symptoms: No symptoms reported    HEENT HEENT Symptoms Reported: No symptoms reported HEENT Self-Management Outcome: 4 (good) HEENT Comment: Patient continues to report improvement - okay eating - still uses applesauce to swallow pills because she feels it makes it easier to swallow and states she feels the swallowing exercises are helping    Cardiovascular Cardiovascular Symptoms Reported: No symptoms reported    Respiratory Respiratory Symptoms Reported: No symptoms reported Other Respiratory Symptoms: Patient confirmed she had her Tezpire injection and she feels it helps her. she states she has been getting injections for asthma for 4 years and goes every 4 weeks and has been receiving the Tezpire for 1 year -  denies any issues Respiratory Self-Management Outcome: 4 (good)  Endocrine Endocrine Symptoms Reported: No symptoms reported Is patient diabetic?: Yes Is patient checking blood sugars at home?: Yes List most recent blood sugar readings, include date and time of day: Patient reports sugar this morning 118 patient reports last A1C 5.6 Endocrine Self-Management Outcome: 4 (good)  Gastrointestinal Gastrointestinal Symptoms Reported: Flatulence Additional Gastrointestinal Details: patient reports no current reflux/heartburn - reports gas      Genitourinary Genitourinary Symptoms Reported: Incontinence Additional Genitourinary Details: ongoing occasional incontinence Genitourinary Self-Management Outcome: 3 (uncertain)  Integumentary Integumentary Symptoms Reported: Wound Additional Integumentary Details: Patient states the amputation site is looking better - denies drainage, redness, warmth Skin Self-Management Outcome: 4 (good)  Musculoskeletal          Psychosocial Psychosocial Symptoms Reported: No symptoms reported         There were no vitals filed for this visit.  Medications Reviewed Today   Medications were not reviewed in this encounter     Recommendation:   Continue Current Plan of Care  Follow Up Plan:   Telephone follow up appointment date/time:  05/21/24 in the afternoon  Shona Prow RN, CCM [place]  VBCI-Population Health RN Care Manager [phone]

## 2024-05-15 ENCOUNTER — Other Ambulatory Visit

## 2024-05-19 ENCOUNTER — Ambulatory Visit: Admitting: Podiatry

## 2024-05-19 ENCOUNTER — Ambulatory Visit

## 2024-05-19 VITALS — BP 110/68 | Ht 63.5 in | Wt 195.0 lb

## 2024-05-19 DIAGNOSIS — Z9889 Other specified postprocedural states: Secondary | ICD-10-CM

## 2024-05-19 DIAGNOSIS — M86171 Other acute osteomyelitis, right ankle and foot: Secondary | ICD-10-CM | POA: Diagnosis not present

## 2024-05-19 DIAGNOSIS — Z0000 Encounter for general adult medical examination without abnormal findings: Secondary | ICD-10-CM | POA: Diagnosis not present

## 2024-05-19 NOTE — Progress Notes (Signed)
 Subjective:   [name] is a 78 y.o. female who presents for an in person Medicare Annual Wellness Visit.  Allergies (verified) Dilantin [phenytoin], Latex, Penicillins, Roxicodone  [oxycodone ], Codeine , Hydrocodone , Lyrica [pregabalin], and Nickel   History: Past Medical History:  Diagnosis Date   Allergic rhinitis    Allergy    Arthritis    Asthma    Breast cancer (HCC)    Chronic headache    Diabetes mellitus    Ductal carcinoma in situ (DCIS) of left breast 02/23/2022   DVT (deep venous thrombosis) (HCC)    Dyspnea    Heart murmur    History of radiation therapy    Left Breast 05/02/22-05/30/22- Dr. Lynwood Nasuti   Hypertension    Penicillin allergy 01/19/2020   Pulmonary embolism (HCC)    Pulmonary hypertension (HCC)    Seizures (HCC)    Past Surgical History:  Procedure Laterality Date   ABDOMINAL HYSTERECTOMY     partial, has ovaries   AMPUTATION TOE Right 04/22/2024   Procedure: AMPUTATION, TOE;  Surgeon: Malvin Marsa FALCON, DPM;  Location: [facility] OR;  Service: Orthopedics/Podiatry;  Laterality: Right;  RIGHT 3RD TOE AMPUTATION   BREAST LUMPECTOMY WITH RADIOACTIVE SEED LOCALIZATION Left 03/29/2022   Procedure: LEFT BREAST LUMPECTOMY WITH RADIOACTIVE SEED LOCALIZATION;  Surgeon: Vanderbilt Ned, MD;  Location: [facility] OR;  Service: General;  Laterality: Left;   CARDIAC CATHETERIZATION  12/28/2010   Mod. pulmonary hypertension, normal coronary arteries   CARDIOVERSION N/A 11/23/2020   Procedure: CARDIOVERSION;  Surgeon: Francyne Headland, MD;  Location: [facility] ENDOSCOPY;  Service: Cardiovascular;  Laterality: N/A;   CHOLECYSTECTOMY N/A 12/20/2021   Procedure: LAPAROSCOPIC CHOLECYSTECTOMY WITH INTRAOPERATIVE CHOLANGIOGRAM;  Surgeon: Eletha Boas, MD;  Location: [facility] ORS;  Service: General;  Laterality: N/A;   DOPPLER ECHOCARDIOGRAPHY  10/08/2011   EF=>55%,mild asymmetric LVH, mod. TR, mod. PH, mild to mod LA dilatation   ESOPHAGEAL MANOMETRY N/A 06/19/2023   Procedure:  ESOPHAGEAL MANOMETRY (EM);  Surgeon: Saintclair Jasper, MD;  Location: [facility] END[facility];  Service: Gastroenterology;  Laterality: N/A;   IR LUMBAR DISC ASPIRATION W/IMG GUIDE  01/12/2020   KNEE ARTHROSCOPY Left    KNEE SURGERY     LOWER EXTREMITY ANGIOGRAPHY N/A 04/20/2024   Procedure: Lower Extremity Angiography;  Surgeon: Pearline Norman RAMAN, MD;  Location: [facility] INVASIVE CV LAB;  Service: Cardiovascular;  Laterality: N/A;   Nuclear Stress Test  05/20/2006   No ischemia   PARTIAL HYSTERECTOMY     PLANTAR FASCIA SURGERY     TONSILLECTOMY     Family History  Problem Relation Age of Onset   Cancer Mother        breast and colon cancer   Hypertension Mother    Clotting disorder Mother    Breast cancer Mother 63   Arthritis Mother    Stroke Mother    Diabetes Mother    Colon cancer Mother 85 - 54   Alcohol  abuse Father    Cancer Sister        breast cancer   Multiple sclerosis Sister    Cancer Sister        colon cancer   Cancer Brother        colon cancer   Prostate cancer Half-Brother    Stomach cancer Half-Brother    Breast cancer Half-Sister 57       recurrence at 38, reports positive genetic testing   Arthritis Half-Sister    Diabetes Half-Sister    Colon cancer Half-Sister 67   Allergies Other  grandson   Allergic rhinitis Neg Hx    Angioedema Neg Hx    Asthma Neg Hx    Atopy Neg Hx    Eczema Neg Hx    Immunodeficiency Neg Hx    Urticaria Neg Hx    Social History   Occupational History   Occupation: retired programme researcher, broadcasting/film/video.   Tobacco Use   Smoking status: Never    Passive exposure: Never   Smokeless tobacco: Never  Vaping Use   Vaping status: Never Used  Substance and Sexual Activity   Alcohol  use: Not Currently    Alcohol /week: 0.0 standard drinks of alcohol     Comment: occ glass on wine   Drug use: No   Sexual activity: Never   Tobacco Counseling Counseling given: Not Answered  SDOH Screenings   Food Insecurity: No Food Insecurity (05/19/2024)  Housing:  Low Risk  (05/19/2024)  Transportation Needs: No Transportation Needs (05/19/2024)  Utilities: Not At Risk (05/19/2024)  Alcohol  Screen: Low Risk  (05/16/2023)  Depression (PHQ2-9): Low Risk  (05/19/2024)  Financial Resource Strain: Low Risk  (05/16/2023)  Physical Activity: Inactive (05/19/2024)  Social Connections: Moderately Isolated (05/19/2024)  Stress: No Stress Concern Present (05/19/2024)  Tobacco Use: Low Risk  (05/19/2024)  Health Literacy: Adequate Health Literacy (05/19/2024)   Depression Screen    05/19/2024    9:54 AM 04/30/2024   11:59 AM 03/20/2024    2:38 PM 11/22/2023   11:18 AM 11/18/2023    2:41 PM 07/11/2023   11:06 AM 06/27/2023   11:35 AM  PHQ 2/9 Scores  PHQ - 2 Score 0 0 0 0 0 0 0  PHQ- 9 Score  0 0  1 2 1      Goals Addressed             This Visit's Progress    Health maintenance   On track    Interventions Today    Flowsheet Row Most Recent Value  Chronic Disease   Chronic disease during today's visit Other  [difficulty sleeping]  General Interventions   General Interventions Discussed/Reviewed General Interventions Reviewed  [Discussed sleep hygiene.  Explained circadium rhythm. Advised not to drink caffiene/ stimulants prior to bed.  Advised when waking up in the morning open blinds/ curtains to let in sunlight.]  Education Interventions   Education Provided Provided Printed Education  [scheduled in office visit to provide patient printed education information on heart attack, stroke, and sleep hygiene.]           I would like to get better       COMPLETED: Increase water intake       Starting 03/28/2018, I will continue to drink at least 6-8 glasses of water daily.        Visit info / Clinical Intake: Medicare Wellness Visit Type:: Subsequent Annual Wellness Visit Medicare Wellness Visit Mode:: In-person (required for WTM) Interpreter Needed?: No Pre-visit prep was completed: yes AWV questionnaire completed by patient prior to visit?:  no Living arrangements:: (!) lives alone Patient's Overall Health Status Rating: good Typical amount of pain: none Does pain affect daily life?: no Are you currently prescribed opioids?: (!) yes  Dietary Habits and Nutritional Risks How many meals a day?: 2 Eats fruit and vegetables daily?: yes Most meals are obtained by: preparing own meals Diabetic:: (!) yes Any non-healing wounds?: no How often do you check your BS?: as needed Would you like to be referred to a Nutritionist or for Diabetic Management? : no  Functional Status Activities  of Daily Living (to include ambulation/medication): Independent Ambulation: Independent with device- listed below Home Assistive Devices/Equipment: [place]; Eyeglasses Medication Administration: Independent Home Management: Independent Manage your own finances?: yes Primary transportation is: driving Concerns about vision?: no *vision screening is required for WTM* Concerns about hearing?: no  Fall Screening Falls in the past year?: 0 Number of falls in past year: 0 Was there an injury with Fall?: 0 Fall Risk Category Calculator: 0 Patient Fall Risk Level: Low Fall Risk  Fall Risk Patient at Risk for Falls Due to: No Fall Risks Fall risk Follow up: Education provided; Falls prevention discussed  Home and Transportation Safety: All rugs have non-skid backing?: yes All stairs or steps have railings?: yes Grab bars in the bathtub or shower?: (!) no Have non-skid surface in bathtub or shower?: (!) no Good home lighting?: yes Regular seat belt use?: yes Hospital stays in the last year:: (!) yes How many hospital stays:: 1 Reason: rt foot toe amputation  Cognitive Assessment Difficulty concentrating, remembering, or making decisions? : yes Will 6CIT or Mini Cog be Completed: yes Give patient an address phrase to remember (5 components): [address]  About what time is it?: 0 points Count backwards from 20 to 1: 0  points Say the months of the year in reverse: 0 points Repeat the address phrase from earlier: 0 points  Advance Directives (For Healthcare) Does Patient Have a Medical Advance Directive?: Yes Type of Advance Directive: Healthcare Power of [place]; Living will Copy of Healthcare Power of Attorney in Chart?: Yes - validated most recent copy scanned in chart (See row information) Copy of Living Will in Chart?: Yes - validated most recent copy scanned in chart (See row information) Would patient like information on creating a medical advance directive?: No - Patient declined        Objective:    Today's Vitals   05/19/24 1001  BP: 110/68  Weight: 195 lb (88.5 kg)  Height: 5' 3.5 (1.613 m)   Body mass index is 34 kg/m.  Current Medications (verified) Outpatient Encounter Medications as of 05/19/2024  Medication Sig   acetaminophen  (TYLENOL ) 500 MG tablet Take 1,000 mg by mouth 2 (two) times daily as needed for fever or headache (pain).   ADVAIR  HFA 230-21 MCG/ACT inhaler Inhale 2 puffs into the lungs 2 (two) times daily. On empty lungs   albuterol  (PROVENTIL ) (2.5 MG/3ML) 0.083% nebulizer solution Take 3 mLs (2.5 mg total) by nebulization every 6 (six) hours as needed for wheezing or shortness of breath.   albuterol  (VENTOLIN  HFA) 108 (90 Base) MCG/ACT inhaler Inhale 2 puffs into the lungs every 6 (six) hours as needed for wheezing or shortness of breath (Take with Flovent  During Flare Ups.). (Patient taking differently: Inhale 2 puffs into the lungs See admin instructions. Inhale 2 puffs every 6 hours as needed for wheezing, shortness of breath. While out of Advair , use 2 puffs every morning and use Flovent  at night.)   anastrozole  (ARIMIDEX ) 1 MG tablet Take 1 tablet (1 mg total) by mouth daily.   atorvastatin  (LIPITOR) 10 MG tablet TAKE 1 TABLET(10 MG) BY MOUTH DAILY   azithromycin  (ZITHROMAX ) 250 MG tablet Take by mouth.   desloratadine  (CLARINEX ) 5 MG tablet TAKE 1 TABLET(5 MG)  BY MOUTH DAILY   dextromethorphan-guaiFENesin (MUCINEX DM) 30-600 MG 12hr tablet Take 1 tablet by mouth daily.   Empagliflozin -linaGLIPtin  (GLYXAMBI ) 10-5 MG TABS TAKE 1 TABLET BY MOUTH DAILY   EPINEPHrine  0.3 mg/0.3 mL IJ SOAJ injection Inject  0.3 mg into the muscle as needed for anaphylaxis. USE AS DIRECTED FOR LIFE THREATENING ALLERGIC REACTIONS   estradiol  (ESTRACE ) 0.01 % CREA vaginal cream APPLY 0.5 GRAMS OF CREAM INTRAVAGINALLY DAILY FOR 2 WEEKS THEN REDUCE TO TWICE WEEKLY   famotidine  (PEPCID ) 40 MG tablet Take 1 tablet (40 mg total) by mouth at bedtime. Take 1 (ONE) tablet by mouth every evening.   fluticasone  (FLOVENT  HFA) 220 MCG/ACT inhaler Inhale 2 puffs into the lungs every 6 (six) hours as needed. Use with Albuterol  during flare ups.   gabapentin  (NEURONTIN ) 300 MG capsule TAKE 1 CAPSULE BY MOUTH IN THE MORNING, 1 CAPSULE AT LUNCH AND 2 CAPSULES AT BEDTIME   glucose blood test strip Use to check blood sugar up to 2 times a day   hydrOXYzine  (ATARAX ) 10 MG tablet Take 1 tablet (10 mg total) by mouth daily as needed for itching.   ipratropium (ATROVENT ) 0.06 % nasal spray Place 2 sprays into both nostrils 2 (two) times daily as needed for rhinitis.   irbesartan  (AVAPRO ) 150 MG tablet TAKE 1 TABLET(150 MG) BY MOUTH DAILY   methimazole  (TAPAZOLE ) 5 MG tablet Take 0.5 tablets (2.5 mg total) by mouth daily.   montelukast  (SINGULAIR ) 10 MG tablet Take 1 tablet (10 mg total) by mouth at bedtime. For asthma control.   neomycin (MYCIFRADIN) 500 MG tablet Take 500 mg by mouth 2 (two) times daily. 10 day course - start after completing metronidazole .   Olopatadine HCl (PATADAY OP) Place 1 drop into both eyes 2 (two) times daily as needed (eye irritation).   oxyCODONE  (OXY IR/ROXICODONE ) 5 MG immediate release tablet Take 1 tablet (5 mg total) by mouth every 6 (six) hours as needed.   pantoprazole  (PROTONIX ) 40 MG tablet Take 1 tablet (40 mg total) by mouth 2 (two) times daily.   Polyethyl  Glycol-Propyl Glycol (SYSTANE OP) Place 1 drop into both eyes daily as needed (dry eyes).   polyethylene glycol powder (GLYCOLAX /MIRALAX ) 17 GM/SCOOP powder Take 17 g by mouth daily as needed (no BM in 24 hours).   rivaroxaban  (XARELTO ) 20 MG TABS tablet Take 1 tablet (20 mg total) by mouth daily with supper.   senna-docusate (SENOKOT-S) 8.6-50 MG tablet Take 2 tablets by mouth at bedtime as needed for moderate constipation.   traMADol  (ULTRAM ) 50 MG tablet Take 50 mg by mouth every 6 (six) hours as needed (pain).   Vibegron  (GEMTESA ) 75 MG TABS 1 tablet daily   Facility-Administered Encounter Medications as of 05/19/2024  Medication   tezepelumab -ekko (TEZSPIRE ) 210 MG/1. syringe 210 mg   Hearing/Vision screen Vision Screening - Comments:: Pt says she is UTD w/visits to Dr Juvenal Immunizations and Health Maintenance Health Maintenance  Topic Date Due   Diabetic kidney evaluation - Urine ACR  Never done   COVID-19 Vaccine (7 - 2025-26 season) 03/16/2024   FOOT EXAM  05/02/2024   Mammogram  07/01/2024   Influenza Vaccine  10/13/2024 (Originally 02/14/2024)   OPHTHALMOLOGY EXAM  09/18/2024   HEMOGLOBIN A1C  10/16/2024   Diabetic kidney evaluation - eGFR measurement  04/27/2025   Medicare Annual Wellness (AWV)  05/19/2025   DEXA SCAN  06/29/2027   Colonoscopy  09/27/2027   DTaP/Tdap/Td (3 - Td or Tdap) 02/09/2031   Pneumococcal Vaccine: 50+ Years  Completed   Hepatitis C Screening  Completed   Zoster Vaccines- Shingrix  Completed   Meningococcal B Vaccine  Aged Out        Assessment/Plan:  This is a routine wellness examination  for [place].  Patient Care Team: Avelina Greig BRAVO, MD as PCP - General (Family Medicine) Croitoru, Jerel, MD as PCP - Cardiology (Cardiology) Maurilio Camellia PARAS, MD as Consulting Physician (Allergy and Immunology) Loetta Nottingham, MD as Consulting Physician (Endocrinology) Christine Rush, DPM as Consulting Physician (Podiatry) Ivin Kocher, MD as Consulting  Physician (Dermatology) Euna Read, MD as Referring Physician (Specialist) Alto Klinefelter, DC as Referring Physician (Chiropractic Medicine) Donnald Charleston, MD as Consulting Physician (Gastroenterology) Hallows, Dene POUR, MD (Orthopedic Surgery) Dohmeier, Dedra, MD as Consulting Physician (Neurology) Vanderbilt Ned, MD as Consulting Physician (General Surgery) Shannon Agent, MD as Consulting Physician (Radiation Oncology) Fate Morna SAILOR, [facility] (Inactive) as Pharmacist (Pharmacist) Ermalinda Lenn HERO, [place] as Social Worker Odean Potts, MD as Consulting Physician (Hematology and Oncology) Gaston Hamilton, MD as Consulting Physician (Urology) Cleatus Collar, MD as Consulting Physician (Ophthalmology) Dentistry, [facility] McCurdy, Shona LABOR, RN as Registered Nurse  I have personally reviewed and noted the following in the patient's chart:   Medical and social history Use of alcohol , tobacco or illicit drugs  Current medications and supplements including opioid prescriptions. Functional ability and status Nutritional status Physical activity Advanced directives List of other physicians Hospitalizations, surgeries, and ER visits in previous 12 months Vitals Screenings to include cognitive, depression, and falls Referrals and appointments  No orders of the defined types were placed in this encounter. Pt says she [facility] flu vaccine [facility] In addition, I have reviewed and discussed with patient certain preventive protocols, quality metrics, and best practice recommendations. A written personalized care plan for preventive services as well as general preventive health recommendations were provided to patient.   Erminio LITTIE Saris, LPN   88/11/7972   Return in 1 year (on 05/19/2025).  After Visit Summary: (In Person-Declined) Patient declined AVS at this time.  Nurse Notes: Pt has no questions or concerns. Next AWV scheduled in one year

## 2024-05-19 NOTE — Patient Instructions (Signed)
 [name],  Thank you for taking the time for your Medicare Wellness Visit. I appreciate your continued commitment to your health goals. Please review the care plan we discussed, and feel free to reach out if I can assist you further.  Please note that Annual Wellness Visits do not include a physical exam. Some assessments may be limited, especially if the visit was conducted virtually. If needed, we may recommend an in-person follow-up with your provider.  Ongoing Care Seeing your primary care provider every 3 to 6 months helps us  monitor your health and provide consistent, personalized care.   Referrals If a referral was made during today's visit and you haven't received any updates within two weeks, please contact the referred provider directly to check on the status.  Recommended Screenings:  Health Maintenance  Topic Date Due   Yearly kidney health urinalysis for diabetes  Never done   COVID-19 Vaccine (7 - 2025-26 season) 03/16/2024   Medicare Annual Wellness Visit  05/15/2024   Complete foot exam   05/02/2024   Breast Cancer Screening  07/01/2024   Flu Shot  10/13/2024*   Eye exam for diabetics  09/18/2024   Hemoglobin A1C  10/16/2024   Yearly kidney function blood test for diabetes  04/27/2025   DEXA scan (bone density measurement)  06/29/2027   Colon Cancer Screening  09/27/2027   DTaP/Tdap/Td vaccine (3 - Td or Tdap) 02/09/2031   Pneumococcal Vaccine for age over 71  Completed   Hepatitis C Screening  Completed   Zoster (Shingles) Vaccine  Completed   Meningitis B Vaccine  Aged Out  *Topic was postponed. The date shown is not the original due date.       05/19/2024    9:43 AM  Advanced Directives  Does Patient Have a Medical Advance Directive? Yes  Type of Estate Agent of [place];Living will  Copy of Healthcare Power of Attorney in Chart? Yes - validated most recent copy scanned in chart (See row information)    Vision: Annual vision  screenings are recommended for early detection of glaucoma, cataracts, and diabetic retinopathy. These exams can also reveal signs of chronic conditions such as diabetes and high blood pressure.  Dental: Annual dental screenings help detect early signs of oral cancer, gum disease, and other conditions linked to overall health, including heart disease and diabetes.

## 2024-05-19 NOTE — Progress Notes (Signed)
  Subjective:  Patient ID: [name], female    DOB: Jan 11, 1946,  MRN: [id number]  No chief complaint on file.   DOS: 04/22/2024 Procedure: Amputation of right third toe at MPJ level  78 y.o. female seen for post op check.  Patient following up 4 week after surgery.  Doing well reports no pain.  Walking in postop shoe.   No concerns.   Review of Systems: Negative except as noted in the HPI. Denies N/V/F/Ch.   Objective:   Constitutional Well developed. Well nourished.  Vascular Foot warm and well perfused. Capillary refill normal to all digits.   No calf pain with palpation  Neurologic Normal speech. Oriented to person, place, and time. Epicritic sensation diminished to right foot  Dermatologic Amputation site right third toe fully healed mild eschar at the amputation site     Orthopedic: Status post right third toe amputation MPJ level with postop shoe in place   Radiographs: Interval resection of the third toe. Expected postoperative changes in the soft tissues.  Pathology:  A. RIGHT THIRD TOE, AMPUTATION:  Reactive cortical and trabecular bone with associated fibrosis and  chronic inflammation compatible with chronic osteomyelitis  Overlying hyperkeratotic skin showing acute inflammation  Proximal skin, soft tissue and bone margin grossly and histologically  free of inflammation   Micro: No growth seen aerobically or anaerobically  Assessment:   1. Acute osteomyelitis of toe of right foot (HCC)   2. Post-operative state    Osteomyelitis of right third toe status post amputation at MPJ level  Plan:  Patient was evaluated and treated and all questions answered.  4 week s/p right third toe amputation MPJ level -Progressing as expected postoperatively, fully healed at the amputation site -XR: Expected postop findings -WB Status: Weightbearing as tolerated in regular shoe gear -Sutures: Previously removed -Medications/ABX: No antibiotics indicated -Dressing:  No wound care needed okay to wash foot can apply Band-Aid if desired otherwise leave open sock - F/u Plan: Follow-up next month for routine care with Dr. May follow-up with me as needed        [name], DPM [facility] / [facility]

## 2024-05-20 DIAGNOSIS — Z89421 Acquired absence of other right toe(s): Secondary | ICD-10-CM | POA: Insufficient documentation

## 2024-05-20 NOTE — Assessment & Plan Note (Signed)
 Acute, poor control but patient now on pantoprazole  as well as Pepcid .  She is using reflux Gourmet to coat esophagus.  She has follow-up with GI for further evaluation.

## 2024-05-20 NOTE — Assessment & Plan Note (Signed)
 Acute, healing well.  Encouraged follow-up with orthopedic as planned.

## 2024-05-20 NOTE — Assessment & Plan Note (Signed)
 Chronic, well controlled with diet despite recent surgery She will continue work on low sugar low carbohydrate diet.

## 2024-05-22 ENCOUNTER — Telehealth: Payer: Self-pay

## 2024-05-22 ENCOUNTER — Ambulatory Visit: Payer: Self-pay | Admitting: Family Medicine

## 2024-05-22 ENCOUNTER — Ambulatory Visit: Admitting: Family Medicine

## 2024-05-22 VITALS — BP 128/70 | HR 88 | Temp 98.7°F | Ht 63.0 in | Wt 193.8 lb

## 2024-05-22 DIAGNOSIS — E1169 Type 2 diabetes mellitus with other specified complication: Secondary | ICD-10-CM | POA: Diagnosis not present

## 2024-05-22 DIAGNOSIS — E1159 Type 2 diabetes mellitus with other circulatory complications: Secondary | ICD-10-CM

## 2024-05-22 DIAGNOSIS — I5032 Chronic diastolic (congestive) heart failure: Secondary | ICD-10-CM

## 2024-05-22 DIAGNOSIS — J4540 Moderate persistent asthma, uncomplicated: Secondary | ICD-10-CM | POA: Diagnosis not present

## 2024-05-22 DIAGNOSIS — G8929 Other chronic pain: Secondary | ICD-10-CM | POA: Insufficient documentation

## 2024-05-22 DIAGNOSIS — G4733 Obstructive sleep apnea (adult) (pediatric): Secondary | ICD-10-CM | POA: Diagnosis not present

## 2024-05-22 DIAGNOSIS — E1140 Type 2 diabetes mellitus with diabetic neuropathy, unspecified: Secondary | ICD-10-CM | POA: Diagnosis not present

## 2024-05-22 DIAGNOSIS — I152 Hypertension secondary to endocrine disorders: Secondary | ICD-10-CM | POA: Diagnosis not present

## 2024-05-22 DIAGNOSIS — M25561 Pain in right knee: Secondary | ICD-10-CM | POA: Diagnosis not present

## 2024-05-22 DIAGNOSIS — I495 Sick sinus syndrome: Secondary | ICD-10-CM

## 2024-05-22 DIAGNOSIS — M86171 Other acute osteomyelitis, right ankle and foot: Secondary | ICD-10-CM

## 2024-05-22 DIAGNOSIS — E1340 Other specified diabetes mellitus with diabetic neuropathy, unspecified: Secondary | ICD-10-CM

## 2024-05-22 DIAGNOSIS — I480 Paroxysmal atrial fibrillation: Secondary | ICD-10-CM

## 2024-05-22 DIAGNOSIS — E785 Hyperlipidemia, unspecified: Secondary | ICD-10-CM | POA: Diagnosis not present

## 2024-05-22 DIAGNOSIS — I272 Pulmonary hypertension, unspecified: Secondary | ICD-10-CM

## 2024-05-22 LAB — HM DIABETES FOOT EXAM

## 2024-05-22 LAB — MICROALBUMIN / CREATININE URINE RATIO
Creatinine,U: 56 mg/dL
Microalb Creat Ratio: UNDETERMINED mg/g (ref 0.0–30.0)
Microalb, Ur: <0.7 mg/dL

## 2024-05-22 MED ORDER — ACETAMINOPHEN-CODEINE 300-30 MG PO TABS: 1.0000 | ORAL_TABLET | Freq: Two times a day (BID) | ORAL | 0 refills | Status: AC | PRN

## 2024-05-22 NOTE — Assessment & Plan Note (Signed)
Chronic, euvolemic in office today.  She does have some intermittent swelling in right lower leg following right total knee replacement.

## 2024-05-22 NOTE — Telephone Encounter (Signed)
 Left voicemail to call office back so I can figure out which cream she was looking for.

## 2024-05-22 NOTE — Progress Notes (Signed)
 Patient ID: [name], female    DOB: [birth year], 78 y.o.   MRN: [id number]  This visit was conducted in person.  BP 128/70   Pulse 88   Temp 98.7 F (37.1 C) (Oral)   Ht 5' 3 (1.6 m)   Wt 193 lb 12.8 oz (87.9 kg)   SpO2 96%   BMI 34.33 kg/m    CC:  Chief Complaint  Patient presents with   Annual Exam    Subjective:   HPI: [name] is a 78 y.o. female presenting on 05/22/2024 for Annual Exam  The patient presents for complete physical and review of chronic health problems. He/She also has the following acute concerns today:    Significant pain in right knee replacement.. Has seen Dr. Loreta about knee pain.. 12/2023 X-ray shows complete collapse of that medial joint on the AP view of the left knee. The right total knee looks excellent.  Impression:  1. Osteoarthritis, left knee.  2. Postop right total knee arthroplasty.   Using voltaren  gel QID a day.. no relief. Tramadol  not helpful for pain.  Has not had to use oxycodone  prescribed for knee.   The patient saw a LPN or RN for medicare wellness visit. 05/19/2024  Prevention and wellness was reviewed in detail. Note reviewed and important notes copied below.  Hypertension:  Blood pressure well controlled on irbesartan  150 mg p.o. daily BP Readings from Last 3 Encounters:  05/22/24 128/70  05/19/24 110/68  05/04/24 120/64  Using medication without problems or lightheadedness:  Chest pain with exertion: none Edema:  off and on on left chronically since knee replacement. Short of breath: stable Average home BPs: Other issues: Wt Readings from Last 3 Encounters:  05/22/24 193 lb 12.8 oz (87.9 kg)  05/19/24 195 lb (88.5 kg)  05/04/24 194 lb 6.4 oz (88.2 kg)   Overactive bladder followed by urology Dr. Consuela.  Treated with both oxybutynin and tolterodine . See above  Diabetes: Excellent control with diet Lab Results  Component Value Date   HGBA1C 5.6 04/17/2024  Using medications without  difficulties: Hypoglycemic episodes: Hyperglycemic episodes: Feet problems: none Blood Sugars averaging: eye exam within last year: yes  Elevated Cholesterol: Cholesterol well controlled on Lipitor 10 mg p.o. daily Lab Results  Component Value Date   CHOL 94 04/21/2024   HDL 53 04/21/2024   LDLCALC 33 04/21/2024   TRIG 39 04/21/2024   CHOLHDL 1.8 04/21/2024  Using medications without problems: Muscle aches:  Diet compliance: Good Exercise: Minimal Other complaints:  Ductal carcinoma in situ left breast treated with left breast lumpectomy by Dr. Vanderbilt. Completed adjuvant radiation and antiestrogen therapy. Reviewed last general surgery office visit and last oncology visit.   Paroxysmal atrial fibrillation, diastolic CHF followed by Dr. Francyne reviewed last office visit  On Xarelto  anticoagulation.  Asthma moderate persistent followed by allergist and pulmonary.  On Spiriva  and Flovent .  Relevant past medical, surgical, family and social history reviewed and updated as indicated. Interim medical history since our last visit reviewed. Allergies and medications reviewed and updated. Outpatient Medications Prior to Visit  Medication Sig Dispense Refill   acetaminophen  (TYLENOL ) 500 MG tablet Take 1,000 mg by mouth 2 (two) times daily as needed for fever or headache (pain).     ADVAIR  HFA 230-21 MCG/ACT inhaler Inhale 2 puffs into the lungs 2 (two) times daily. On empty lungs 36 g 1   albuterol  (PROVENTIL ) (2.5 MG/3ML) 0.083% nebulizer solution Take 3 mLs (2.5 mg total) by  nebulization every 6 (six) hours as needed for wheezing or shortness of breath. 150 mL 1   albuterol  (VENTOLIN  HFA) 108 (90 Base) MCG/ACT inhaler Inhale 2 puffs into the lungs every 6 (six) hours as needed for wheezing or shortness of breath (Take with Flovent  During Flare Ups.). (Patient taking differently: Inhale 2 puffs into the lungs See admin instructions. Inhale 2 puffs every 6 hours as needed for wheezing,  shortness of breath. While out of Advair , use 2 puffs every morning and use Flovent  at night.) 18 g 1   anastrozole  (ARIMIDEX ) 1 MG tablet Take 1 tablet (1 mg total) by mouth daily. 30 tablet 3   atorvastatin  (LIPITOR) 10 MG tablet TAKE 1 TABLET(10 MG) BY MOUTH DAILY 90 tablet 3   azithromycin  (ZITHROMAX ) 250 MG tablet Take by mouth.     desloratadine  (CLARINEX ) 5 MG tablet TAKE 1 TABLET(5 MG) BY MOUTH DAILY 90 tablet 1   dextromethorphan-guaiFENesin (MUCINEX DM) 30-600 MG 12hr tablet Take 1 tablet by mouth daily.     Empagliflozin -linaGLIPtin  (GLYXAMBI ) 10-5 MG TABS TAKE 1 TABLET BY MOUTH DAILY 30 tablet 0   EPINEPHrine  0.3 mg/0.3 mL IJ SOAJ injection Inject 0.3 mg into the muscle as needed for anaphylaxis. USE AS DIRECTED FOR LIFE THREATENING ALLERGIC REACTIONS 2 each 1   estradiol  (ESTRACE ) 0.01 % CREA vaginal cream APPLY 0.5 GRAMS OF CREAM INTRAVAGINALLY DAILY FOR 2 WEEKS THEN REDUCE TO TWICE WEEKLY     famotidine  (PEPCID ) 40 MG tablet Take 1 tablet (40 mg total) by mouth at bedtime. Take 1 (ONE) tablet by mouth every evening. 90 tablet 1   fluticasone  (FLOVENT  HFA) 220 MCG/ACT inhaler Inhale 2 puffs into the lungs every 6 (six) hours as needed. Use with Albuterol  during flare ups. 36 g 1   gabapentin  (NEURONTIN ) 300 MG capsule TAKE 1 CAPSULE BY MOUTH IN THE MORNING, 1 CAPSULE AT LUNCH AND 2 CAPSULES AT BEDTIME 360 capsule 3   glucose blood test strip Use to check blood sugar up to 2 times a day 100 each 12   hydrOXYzine  (ATARAX ) 10 MG tablet Take 1 tablet (10 mg total) by mouth daily as needed for itching. 30 tablet 2   ipratropium (ATROVENT ) 0.06 % nasal spray Place 2 sprays into both nostrils 2 (two) times daily as needed for rhinitis. 45 mL 1   irbesartan  (AVAPRO ) 150 MG tablet TAKE 1 TABLET(150 MG) BY MOUTH DAILY 90 tablet 1   methimazole  (TAPAZOLE ) 5 MG tablet Take 0.5 tablets (2.5 mg total) by mouth daily. 45 tablet 3   montelukast  (SINGULAIR ) 10 MG tablet Take 1 tablet (10 mg total) by  mouth at bedtime. For asthma control. 90 tablet 1   neomycin (MYCIFRADIN) 500 MG tablet Take 500 mg by mouth 2 (two) times daily. 10 day course - start after completing metronidazole .     Olopatadine HCl (PATADAY OP) Place 1 drop into both eyes 2 (two) times daily as needed (eye irritation).     pantoprazole  (PROTONIX ) 40 MG tablet Take 1 tablet (40 mg total) by mouth 2 (two) times daily. 180 tablet 1   Polyethyl Glycol-Propyl Glycol (SYSTANE OP) Place 1 drop into both eyes daily as needed (dry eyes).     polyethylene glycol powder (GLYCOLAX /MIRALAX ) 17 GM/SCOOP powder Take 17 g by mouth daily as needed (no BM in 24 hours).     rivaroxaban  (XARELTO ) 20 MG TABS tablet Take 1 tablet (20 mg total) by mouth daily with supper. 90 tablet 1   senna-docusate (SENOKOT-S) 8.6-50  MG tablet Take 2 tablets by mouth at bedtime as needed for moderate constipation. 60 tablet 0   Vibegron  (GEMTESA ) 75 MG TABS 1 tablet daily 30 tablet 0   oxyCODONE  (OXY IR/ROXICODONE ) 5 MG immediate release tablet Take 1 tablet (5 mg total) by mouth every 6 (six) hours as needed. 20 tablet 0   traMADol  (ULTRAM ) 50 MG tablet Take 50 mg by mouth every 6 (six) hours as needed (pain).     Facility-Administered Medications Prior to Visit  Medication Dose Route Frequency Provider Last Rate Last Admin   tezepelumab -ekko (TEZSPIRE ) 210 MG/1. syringe 210 mg  210 mg Subcutaneous Q28 days Kozlow, Eric J, MD   210 mg at 05/12/24 1006     Per HPI unless specifically indicated in ROS section below Review of Systems  Constitutional:  Negative for fatigue and fever.  HENT:  Negative for congestion.   Eyes:  Negative for pain.  Respiratory:  Negative for cough and shortness of breath.   Cardiovascular:  Negative for chest pain, palpitations and leg swelling.  Gastrointestinal:  Negative for abdominal pain.  Genitourinary:  Negative for dysuria and vaginal bleeding.  Musculoskeletal:  Negative for back pain.  Neurological:  Negative for  syncope, light-headedness and headaches.  Psychiatric/Behavioral:  Negative for dysphoric mood.    Objective:  BP 128/70   Pulse 88   Temp 98.7 F (37.1 C) (Oral)   Ht 5' 3 (1.6 m)   Wt 193 lb 12.8 oz (87.9 kg)   SpO2 96%   BMI 34.33 kg/m   Wt Readings from Last 3 Encounters:  05/22/24 193 lb 12.8 oz (87.9 kg)  05/19/24 195 lb (88.5 kg)  05/04/24 194 lb 6.4 oz (88.2 kg)      Physical Exam Constitutional:      General: She is not in acute distress.    Appearance: Normal appearance. She is well-developed. She is not ill-appearing or toxic-appearing.  HENT:     Head: Normocephalic.     Right Ear: Hearing, tympanic membrane, ear canal and external ear normal. Tympanic membrane is not erythematous, retracted or bulging.     Left Ear: Hearing, tympanic membrane, ear canal and external ear normal. Tympanic membrane is not erythematous, retracted or bulging.     Nose: No mucosal edema or rhinorrhea.     Right Sinus: No maxillary sinus tenderness or frontal sinus tenderness.     Left Sinus: No maxillary sinus tenderness or frontal sinus tenderness.     Mouth/Throat:     Pharynx: Uvula midline.  Eyes:     General: Lids are normal. Lids are everted, no foreign bodies appreciated.     Conjunctiva/sclera: Conjunctivae normal.     Pupils: Pupils are equal, round, and reactive to light.  Neck:     Thyroid : No thyroid  mass or thyromegaly.     Vascular: No carotid bruit.     Trachea: Trachea normal.  Cardiovascular:     Rate and Rhythm: Normal rate and regular rhythm.     Pulses: Normal pulses.     Heart sounds: Normal heart sounds, S1 normal and S2 normal. No murmur heard.    No friction rub. No gallop.  Pulmonary:     Effort: Pulmonary effort is normal. No tachypnea or respiratory distress.     Breath sounds: Normal breath sounds. No decreased breath sounds, wheezing, rhonchi or rales.  Abdominal:     General: Bowel sounds are normal.     Palpations: Abdomen is soft.  Tenderness: There is no abdominal tenderness.  Musculoskeletal:     Cervical back: Normal range of motion and neck supple.  Skin:    General: Skin is warm and dry.     Findings: No rash.  Neurological:     Mental Status: She is alert.  Psychiatric:        Mood and Affect: Mood is not anxious or depressed.        Speech: Speech normal.        Behavior: Behavior normal. Behavior is cooperative.        Thought Content: Thought content normal.        Judgment: Judgment normal.   Diabetic foot exam: bunions and arthritis changes of joint.  Healing right middle toe amputation No skin breakdown Has prediabetic  calluses bilateral feet Normal DP pulses NO sensation to light touch and monofilament Nails abnormal  Results for orders placed or performed in visit on 05/22/24  HM DIABETES FOOT EXAM   Collection Time: 05/22/24 12:00 AM  Result Value Ref Range   HM Diabetic Foot Exam done    *Note: Due to a large number of results and/or encounters for the requested time period, some results have not been displayed. A complete set of results can be found in Results Review.     COVID 19 screen:  No recent travel or known exposure to COVID19 The patient denies respiratory symptoms of COVID 19 at this time. The importance of social distancing was discussed today.   Assessment and Plan The patient's preventative maintenance and recommended screening tests for an annual wellness exam were reviewed in full today. Brought up to date unless services declined.  Counselled on the importance of diet, exercise, and its role in overall health and mortality. The patient's FH and SH was reviewed, including their home life, tobacco status, and drug and alcohol  status.   Mammogram: history of breast cancer, 06/2023.. due in 06/2024 DEXA 06/2022 normal.  PAP not indicated Hep C neg  Vaccines: uptodate with tdap, PNA, flu uptodate, and COVID x 5, she says she has had shingles series.  Colonoscopy  09/2022, repeat in 5 years   Problem List Items Addressed This Visit     Acute osteomyelitis of toe of right foot (HCC)   Recently treated with toe amputation. Healing well.  Reviewed surgical postop note      Asthma, moderate persistent - Primary   Chronic, stable control on Advair .  No current asthma exacerbation.      Chronic diastolic heart failure (HCC)   Chronic, euvolemic in office today.  She does have some intermittent swelling in right lower leg following right total knee replacement.      Chronic knee pain after total replacement of right knee joint     Acute on chronic,  X-ray unremarkable.  No sign on exam of infection.  She is requesting MRI... I have asked her to  discuss with her ORTHO.       Relevant Medications   acetaminophen -codeine  (TYLENOL  #3) 300-30 MG tablet   Hyperlipidemia associated with type 2 diabetes mellitus (HCC) (Chronic)   Stable, chronic.  Continue current medication.  Lipitor 10 mg p.o. daily      Hypertension associated with diabetes (HCC)   Chronic, well controlled on irbesartan  150 mg daily      Neuropathy due to secondary diabetes (HCC)    Associated with DM.        Obstructive sleep apnea treated with continuous positive airway pressure (CPAP) (Chronic)  ON CPAP      Paroxysmal atrial fibrillation (HCC)   On Xarelto  anticoagulation, rate controlled.  Followed by cardiology      Pulmonary hypertension (HCC)    Chronic, followed by Cards.      SSS (sick sinus syndrome) (HCC)    Followed by Cardiology.      Type 2 diabetes mellitus with diabetic neuropathy, unspecified (HCC)   Chronic, well controlled with diet She will continue work on low sugar low carbohydrate diet.        Patient Care Team: Avelina Greig BRAVO, MD as PCP - General (Family Medicine) Croitoru, Jerel, MD as PCP - Cardiology (Cardiology) Maurilio, Camellia PARAS, MD as Consulting Physician (Allergy and Immunology) Loetta Nottingham, MD as Consulting Physician  (Endocrinology) Christine Rush, DPM as Consulting Physician (Podiatry) Ivin Kocher, MD as Consulting Physician (Dermatology) Euna Read, MD as Referring Physician (Specialist) Kin, Olivia, DC as Referring Physician (Chiropractic Medicine) Donnald Charleston, MD as Consulting Physician (Gastroenterology) Hallows, Dene POUR, MD (Orthopedic Surgery) Dohmeier, Dedra, MD as Consulting Physician (Neurology) Vanderbilt Ned, MD as Consulting Physician (General Surgery) Shannon Agent, MD as Consulting Physician (Radiation Oncology) Fate Morna SAILOR, [facility] (Inactive) as Pharmacist (Pharmacist) Ermalinda, Lenn HERO, [place] as Social Worker Odean Potts, MD as Consulting Physician (Hematology and Oncology) Gaston Hamilton, MD as Consulting Physician (Urology) Cleatus Collar, MD as Consulting Physician (Ophthalmology) [facility] McCurdy, Shona LABOR, RN as Registered Nurse   Greig Avelina, MD

## 2024-05-22 NOTE — Assessment & Plan Note (Signed)
On Xarelto anticoagulation, rate controlled.  Followed by cardiology

## 2024-05-22 NOTE — Assessment & Plan Note (Signed)
 Chronic, stable control on Advair.  No current asthma exacerbation.

## 2024-05-22 NOTE — Assessment & Plan Note (Signed)
 Recently treated with toe amputation. Healing well.  Reviewed surgical postop note

## 2024-05-22 NOTE — Assessment & Plan Note (Signed)
 Chronic, well controlled with diet  She will continue work on low sugar low carbohydrate diet.

## 2024-05-22 NOTE — Telephone Encounter (Signed)
 Patient return call and stated it was a vaginal cream called Estradiol  vaginal cream.

## 2024-05-22 NOTE — Assessment & Plan Note (Signed)
 Followed by Cardiology

## 2024-05-22 NOTE — Assessment & Plan Note (Signed)
 Chronic, followed by Cards.

## 2024-05-22 NOTE — Assessment & Plan Note (Signed)
ON CPAP

## 2024-05-22 NOTE — Assessment & Plan Note (Signed)
Associated with DM.

## 2024-05-22 NOTE — Assessment & Plan Note (Signed)
Stable, chronic.  Continue current medication.  Lipitor 10 mg p.o. daily

## 2024-05-22 NOTE — Assessment & Plan Note (Signed)
 Acute on chronic,  X-ray unremarkable.  No sign on exam of infection.  She is requesting MRI... I have asked her to  discuss with her ORTHO.

## 2024-05-22 NOTE — Assessment & Plan Note (Signed)
Chronic, well controlled on irbesartan 150 mg daily

## 2024-05-25 ENCOUNTER — Other Ambulatory Visit: Payer: Self-pay | Admitting: Cardiovascular Disease

## 2024-05-25 ENCOUNTER — Telehealth: Payer: Self-pay

## 2024-05-25 DIAGNOSIS — I480 Paroxysmal atrial fibrillation: Secondary | ICD-10-CM

## 2024-05-25 DIAGNOSIS — M25561 Pain in right knee: Secondary | ICD-10-CM | POA: Diagnosis not present

## 2024-05-25 NOTE — Telephone Encounter (Signed)
 [name] notified as instructed by telephone.  Patient states understanding.

## 2024-05-25 NOTE — Telephone Encounter (Signed)
 Xarelto  20mg  refill request received. Pt is 78 years old, weight-87.9kg, Crea-0.81 on 04/27/24, last seen by Dr. Francyne on 04/19/23 and pending appt on 06/29/24, Diagnosis-Afib, CrCl-80.71 mL/min; Dose is appropriate based on dosing criteria. Will send in refill to requested pharmacy.

## 2024-05-25 NOTE — Telephone Encounter (Signed)
*  STAT* If patient is at the pharmacy, call can be transferred to refill team.   1. Which medications need to be refilled? (please list name of each medication and dose if known)   rivaroxaban  (XARELTO ) 20 MG TABS tablet     2. Would you like to learn more about the convenience, safety, & potential cost savings by using the [facility]? No     3. Are you open to using the [facility] (Type [facility]. No    4. Which pharmacy/location (including street and city if local pharmacy) is medication to be sent to? [id number] - [place], [place] - [address] OF [address]     5. Do they need a 30 day or 90 day supply? 90 day   Pt has appt scheduled 12/15

## 2024-06-01 ENCOUNTER — Encounter: Payer: Self-pay | Admitting: Podiatry

## 2024-06-01 ENCOUNTER — Ambulatory Visit: Admitting: Podiatry

## 2024-06-01 DIAGNOSIS — M79676 Pain in unspecified toe(s): Secondary | ICD-10-CM

## 2024-06-01 DIAGNOSIS — B351 Tinea unguium: Secondary | ICD-10-CM | POA: Diagnosis not present

## 2024-06-01 DIAGNOSIS — L84 Corns and callosities: Secondary | ICD-10-CM | POA: Diagnosis not present

## 2024-06-01 DIAGNOSIS — Z89421 Acquired absence of other right toe(s): Secondary | ICD-10-CM

## 2024-06-01 DIAGNOSIS — E1142 Type 2 diabetes mellitus with diabetic polyneuropathy: Secondary | ICD-10-CM | POA: Diagnosis not present

## 2024-06-01 NOTE — Progress Notes (Signed)
 Subjective:  Patient ID: [name], female    DOB: [birth year],  MRN: [id number]  [name] presents to clinic today for at risk foot care. Patient has h/o NIDDM, neuropathy with amputation of digital amputation right third digit and preulcerative lesion(s) of both feet and painful mycotic toenails that limit ambulation. Painful toenails interfere with ambulation. Aggravating factors include wearing enclosed shoe gear. Pain is relieved with periodic professional debridement. Painful preulcerative lesion(s) is/are aggravated when weightbearing with and without shoegear. Pain is relieved with periodic professional debridement.   Patient underwent amputation right 3rd digit due to diabetic foot infection under Dr. Malvin at [facility]. Incision has healed well. She had   Patient relates burning sensation left 5th digit and left 3rd digit. I need to see Dr. Verta.   She had Vascular testing in the hospital and was found to have adequate perfusion bilaterally. Chief Complaint  Patient presents with   Toe Pain    She saw Dr. Avelina in Nov. She  states her A1c is 5.6   New problem(s): None.   PCP is Avelina Greig BRAVO, MD.  Allergies  Allergen Reactions   Dilantin [Phenytoin] Swelling    Facial swelling   Latex Hives   Penicillins Nausea And Vomiting and Swelling   Roxicodone  [Oxycodone ] Nausea And Vomiting and Other (See Comments)    Abdominal pain   Codeine  Nausea Only    Tolerates Tylenol  with codeine  combination   Hydrocodone  Nausea And Vomiting   Lyrica [Pregabalin] Itching and Other (See Comments)    Headaches Vision changes   Nickel Hives and Rash    Review of Systems: Negative except as noted in the HPI.  Objective:  There were no vitals filed for this visit. [name] is a pleasant 78 y.o. female WD, WN in NAD. AAO x 3.  Vascular Examination: Capillary refill time immediate b/l. Vascular status intact b/l with palpable pedal pulses. Pedal  hair sparse. No edema. No pain with calf compression b/l. Skin temperature gradient WNL b/l. No cyanosis or clubbing b/l. No ischemia or gangrene noted b/l LE.  Neurological Examination: Sensation grossly intact b/l with 10 gram monofilament. Vibratory sensation intact b/l. Pt has subjective symptoms of neuropathy.  Dermatological Examination: Well healed surgical scar(s) noted at amputation site of right 3rd digit.  No open wounds. Pedal skin with normal turgor, texture and tone b/l.  No interdigital macerations.   Toenails 2-5 b/l thick, discolored, elongated with subungual debris and pain on dorsal palpation.   Anonychia noted bilateral great toes. Nailbed(s) epithelialized.   Preulcerative lesions medial IPJ right great toe,  submet head 3, 4 left foot, submet head 5 b/l, sub 5th met base right lower extremity. No erythema, no edema, no drainage, no fluctuance.  Musculoskeletal Examination: Muscle strength 5/5 to all lower extremity muscle groups bilaterally. Plantarflexed metatarsal(s) 5th metatarsal head b/l lower extremities.  Radiographs: None       Assessment/Plan: 1. Pain due to onychomycosis of toenail   2. Pre-ulcerative calluses   3. History of amputation of right third toe   4. Diabetic polyneuropathy associated with type 2 diabetes mellitus (HCC)   -Patient was evaluated today. All questions/concerns addressed on today's visit. -Consent given for treatment as described below: -Discussed neuropathic pain.  -She will see Dr. Verta for evaluation. -Examined patient. -Recommend patient see Pedorthist to modify diabetic insoles to offload lesions of both feet. -Continue diabetic shoes daily. -Toenails 2-5 left foot, R hallux, R 2nd toe, R 4th  toe, and R 5th toe debrided in length and girth without iatrogenic bleeding with sterile nail nipper and dremel.  -Preulcerative lesion pared medial IPJ of right great toe, submet head 3 left foot, submet head 4 left foot, submet  head 5 b/l, and sub 5th met base right foot utilizing sterile scalpel blade. Total number pared=5. -Patient/POA to call should there be question/concern in the interim.   Return in about 3 months (around 09/01/2024).  [name], DPM      [facility] LOCATION: [name], [place] [address]                   Office [phone]   [facility] LOCATION: [address] [place], [place] [address] Office [phone]

## 2024-06-02 ENCOUNTER — Encounter: Payer: Self-pay | Admitting: Family Medicine

## 2024-06-02 ENCOUNTER — Ambulatory Visit (INDEPENDENT_AMBULATORY_CARE_PROVIDER_SITE_OTHER): Admitting: Family Medicine

## 2024-06-02 VITALS — BP 120/64 | HR 72 | Temp 99.3°F | Ht 63.0 in | Wt 198.5 lb

## 2024-06-02 DIAGNOSIS — B372 Candidiasis of skin and nail: Secondary | ICD-10-CM

## 2024-06-02 MED ORDER — NYSTATIN 100000 UNIT/GM EX CREA: 1.0000 | TOPICAL_CREAM | Freq: Two times a day (BID) | CUTANEOUS | 2 refills | Status: AC

## 2024-06-02 NOTE — Progress Notes (Signed)
 Patient ID: [name], female    DOB: 1945-11-28, 78 y.o.   MRN: [id number]  This visit was conducted in person.  BP 120/64   Pulse 72   Temp 99.3 F (37.4 C) (Temporal)   Ht 5' 3 (1.6 m)   Wt 198 lb 8 oz (90 kg)   SpO2 96%   BMI 35.16 kg/m    CC:  Chief Complaint  Patient presents with   Bumps in Groin Area    Subjective:   HPI: [name] is a 78 y.o. female presenting on 06/02/2024 for Bumps in Groin Area    She has noted nodules in bilateral groin, itchy noted few days ago No skin color change, no redness, no heat. She has started using Desitin in bilateral groin.   No fever.  No vaginal issues.    She has been on antibiotics x 1 week for right knee issue Relevant past medical, surgical, family and social history reviewed and updated as indicated. Interim medical history since our last visit reviewed. Allergies and medications reviewed and updated. Outpatient Medications Prior to Visit  Medication Sig Dispense Refill   acetaminophen  (TYLENOL ) 500 MG tablet Take 1,000 mg by mouth 2 (two) times daily as needed for fever or headache (pain).     ADVAIR  HFA 230-21 MCG/ACT inhaler Inhale 2 puffs into the lungs 2 (two) times daily. On empty lungs 36 g 1   albuterol  (PROVENTIL ) (2.5 MG/3ML) 0.083% nebulizer solution Take 3 mLs (2.5 mg total) by nebulization every 6 (six) hours as needed for wheezing or shortness of breath. 150 mL 1   albuterol  (VENTOLIN  HFA) 108 (90 Base) MCG/ACT inhaler Inhale 2 puffs into the lungs every 6 (six) hours as needed for wheezing or shortness of breath (Take with Flovent  During Flare Ups.). 18 g 1   anastrozole  (ARIMIDEX ) 1 MG tablet Take 1 tablet (1 mg total) by mouth daily. 30 tablet 3   atorvastatin  (LIPITOR) 10 MG tablet TAKE 1 TABLET(10 MG) BY MOUTH DAILY 90 tablet 3   desloratadine  (CLARINEX ) 5 MG tablet TAKE 1 TABLET(5 MG) BY MOUTH DAILY 90 tablet 1   dextromethorphan-guaiFENesin (MUCINEX DM) 30-600 MG 12hr tablet Take 1 tablet  by mouth daily.     doxycycline  (VIBRAMYCIN ) 100 MG capsule Take 100 mg by mouth 2 (two) times daily.     Empagliflozin -linaGLIPtin  (GLYXAMBI ) 10-5 MG TABS TAKE 1 TABLET BY MOUTH DAILY 30 tablet 0   EPINEPHrine  0.3 mg/0.3 mL IJ SOAJ injection Inject 0.3 mg into the muscle as needed for anaphylaxis. USE AS DIRECTED FOR LIFE THREATENING ALLERGIC REACTIONS 2 each 1   famotidine  (PEPCID ) 40 MG tablet Take 1 tablet (40 mg total) by mouth at bedtime. Take 1 (ONE) tablet by mouth every evening. 90 tablet 1   fluticasone  (FLOVENT  HFA) 220 MCG/ACT inhaler Inhale 2 puffs into the lungs every 6 (six) hours as needed. Use with Albuterol  during flare ups. 36 g 1   gabapentin  (NEURONTIN ) 300 MG capsule TAKE 1 CAPSULE BY MOUTH IN THE MORNING, 1 CAPSULE AT LUNCH AND 2 CAPSULES AT BEDTIME 360 capsule 3   glucose blood test strip Use to check blood sugar up to 2 times a day 100 each 12   hydrOXYzine  (ATARAX ) 10 MG tablet Take 1 tablet (10 mg total) by mouth daily as needed for itching. 30 tablet 2   ipratropium (ATROVENT ) 0.06 % nasal spray Place 2 sprays into both nostrils 2 (two) times daily as needed for rhinitis. 45 mL  1   irbesartan  (AVAPRO ) 150 MG tablet TAKE 1 TABLET(150 MG) BY MOUTH DAILY 90 tablet 1   methimazole  (TAPAZOLE ) 5 MG tablet Take 0.5 tablets (2.5 mg total) by mouth daily. 45 tablet 3   montelukast  (SINGULAIR ) 10 MG tablet Take 1 tablet (10 mg total) by mouth at bedtime. For asthma control. 90 tablet 1   neomycin (MYCIFRADIN) 500 MG tablet Take 500 mg by mouth 2 (two) times daily. 10 day course - start after completing metronidazole .     Olopatadine HCl (PATADAY OP) Place 1 drop into both eyes 2 (two) times daily as needed (eye irritation).     pantoprazole  (PROTONIX ) 40 MG tablet Take 1 tablet (40 mg total) by mouth 2 (two) times daily. 180 tablet 1   Polyethyl Glycol-Propyl Glycol (SYSTANE OP) Place 1 drop into both eyes daily as needed (dry eyes).     polyethylene glycol powder  (GLYCOLAX /MIRALAX ) 17 GM/SCOOP powder Take 17 g by mouth daily as needed (no BM in 24 hours).     senna-docusate (SENOKOT-S) 8.6-50 MG tablet Take 2 tablets by mouth at bedtime as needed for moderate constipation. 60 tablet 0   Vibegron  (GEMTESA ) 75 MG TABS 1 tablet daily 30 tablet 0   XARELTO  20 MG TABS tablet TAKE 1 TABLET(20 MG) BY MOUTH DAILY WITH SUPPER 90 tablet 1   azithromycin  (ZITHROMAX ) 250 MG tablet Take by mouth.     Facility-Administered Medications Prior to Visit  Medication Dose Route Frequency Provider Last Rate Last Admin   tezepelumab -ekko (TEZSPIRE ) 210 MG/1. syringe 210 mg  210 mg Subcutaneous Q28 days Kozlow, Eric J, MD   210 mg at 05/12/24 1006     Per HPI unless specifically indicated in ROS section below Review of Systems  Constitutional:  Negative for fatigue and fever.  HENT:  Negative for congestion.   Eyes:  Negative for pain.  Respiratory:  Negative for cough and shortness of breath.   Cardiovascular:  Negative for chest pain, palpitations and leg swelling.  Gastrointestinal:  Negative for abdominal pain.  Genitourinary:  Negative for dysuria and vaginal bleeding.  Musculoskeletal:  Negative for back pain.  Neurological:  Negative for syncope, light-headedness and headaches.  Psychiatric/Behavioral:  Negative for dysphoric mood.    Objective:  BP 120/64   Pulse 72   Temp 99.3 F (37.4 C) (Temporal)   Ht 5' 3 (1.6 m)   Wt 198 lb 8 oz (90 kg)   SpO2 96%   BMI 35.16 kg/m   Wt Readings from Last 3 Encounters:  06/02/24 198 lb 8 oz (90 kg)  05/22/24 193 lb 12.8 oz (87.9 kg)  05/19/24 195 lb (88.5 kg)      Physical Exam Constitutional:      General: She is not in acute distress.    Appearance: Normal appearance. She is well-developed. She is not ill-appearing or toxic-appearing.  HENT:     Head: Normocephalic.     Right Ear: Hearing, tympanic membrane, ear canal and external ear normal. Tympanic membrane is not erythematous, retracted or  bulging.     Left Ear: Hearing, tympanic membrane, ear canal and external ear normal. Tympanic membrane is not erythematous, retracted or bulging.     Nose: No mucosal edema or rhinorrhea.     Right Sinus: No maxillary sinus tenderness or frontal sinus tenderness.     Left Sinus: No maxillary sinus tenderness or frontal sinus tenderness.     Mouth/Throat:     Pharynx: Uvula midline.  Eyes:  General: Lids are normal. Lids are everted, no foreign bodies appreciated.     Conjunctiva/sclera: Conjunctivae normal.     Pupils: Pupils are equal, round, and reactive to light.  Neck:     Thyroid : No thyroid  mass or thyromegaly.     Vascular: No carotid bruit.     Trachea: Trachea normal.  Cardiovascular:     Rate and Rhythm: Normal rate and regular rhythm.     Pulses: Normal pulses.     Heart sounds: Normal heart sounds, S1 normal and S2 normal. No murmur heard.    No friction rub. No gallop.  Pulmonary:     Effort: Pulmonary effort is normal. No tachypnea or respiratory distress.     Breath sounds: Normal breath sounds. No decreased breath sounds, wheezing, rhonchi or rales.  Abdominal:     General: Bowel sounds are normal.     Palpations: Abdomen is soft.     Tenderness: There is no abdominal tenderness.  Musculoskeletal:     Cervical back: Normal range of motion and neck supple.  Skin:    General: Skin is warm and dry.     Findings: Rash present.         Comments: Small erythematous maculopapular rash bilateral groin  Neurological:     Mental Status: She is alert.  Psychiatric:        Mood and Affect: Mood is not anxious or depressed.        Speech: Speech normal.        Behavior: Behavior normal. Behavior is cooperative.        Thought Content: Thought content normal.        Judgment: Judgment normal.       Results for orders placed or performed in visit on 05/22/24  [facility] DIABETES FOOT EXAM   Collection Time: 05/22/24 12:00 AM  Result Value Ref Range   [facility] Diabetic Foot  Exam done   Microalbumin / creatinine urine ratio   Collection Time: 05/22/24 12:01 PM  Result Value Ref Range   Microalb, Ur <0.7 mg/dL   Creatinine,U 43.9 mg/dL   Microalb Creat Ratio Unable to calculate 0.0 - 30.0 mg/g   *Note: Due to a large number of results and/or encounters for the requested time period, some results have not been displayed. A complete set of results can be found in Results Review.    Assessment and Plan  Candidal intertrigo Assessment & Plan: Acute, contact dermatitis versus candidal intertrigo.  Candida most likely given recent initiation of antibiotics for knee issue. Start nystatin  cream twice daily for 1 to 2 weeks.   Other orders -     Nystatin ; Apply 1 Application topically 2 (two) times daily.  Dispense: 30 g; Refill: 2    No follow-ups on file.   Greig Ring, MD

## 2024-06-02 NOTE — Assessment & Plan Note (Signed)
 Acute, contact dermatitis versus candidal intertrigo.  Candida most likely given recent initiation of antibiotics for knee issue. Start nystatin  cream twice daily for 1 to 2 weeks.

## 2024-06-09 ENCOUNTER — Ambulatory Visit: Admitting: Allergy and Immunology

## 2024-06-09 ENCOUNTER — Other Ambulatory Visit: Payer: Self-pay

## 2024-06-09 ENCOUNTER — Ambulatory Visit (INDEPENDENT_AMBULATORY_CARE_PROVIDER_SITE_OTHER)

## 2024-06-09 ENCOUNTER — Encounter: Payer: Self-pay | Admitting: Allergy and Immunology

## 2024-06-09 VITALS — BP 132/70 | HR 91 | Temp 97.2°F | Resp 18

## 2024-06-09 DIAGNOSIS — K219 Gastro-esophageal reflux disease without esophagitis: Secondary | ICD-10-CM | POA: Diagnosis not present

## 2024-06-09 DIAGNOSIS — T7819XD Other adverse food reactions, not elsewhere classified, subsequent encounter: Secondary | ICD-10-CM | POA: Diagnosis not present

## 2024-06-09 DIAGNOSIS — J3089 Other allergic rhinitis: Secondary | ICD-10-CM

## 2024-06-09 DIAGNOSIS — J4550 Severe persistent asthma, uncomplicated: Secondary | ICD-10-CM

## 2024-06-09 MED ORDER — FLUTICASONE PROPIONATE HFA 220 MCG/ACT IN AERO: 2.0000 | INHALATION_SPRAY | Freq: Four times a day (QID) | RESPIRATORY_TRACT | 1 refills | Status: DC | PRN

## 2024-06-09 NOTE — Patient Instructions (Addendum)
  1. Continue to treat and prevent airway inflammation:  A.  Advair  230 - 2 inhalations 1-2 times a day (empty lungs)  B.  Montelukast  10mg  - 1 tablet 1 time per day.  C.  Tezepelumab  injections every 4 weeks.    2. Continue to treat reflux / swallowing issue:  A. Pantoprazole  40mg  - 2 times per day B. Famotidine  40 mg in PM C. Replace throat clearing with swallowing / drinking maneuver  3. If needed:   A. Nasal saline several times per day  B. Albuterol  + Fluticasone  220 - 2 inhalations TOGETHER every 6 hours  C. Albuterol  neb + Fluticasone  220 - 2 inhalations TOGETHER every 6 hours  D. Epi-pen  4. Obtain Covid vaccine    5. Return to clinic in 12 weeks or earlier if there is a problem.  6. Influenza = Tamiflu. Covid = molnupiravir

## 2024-06-09 NOTE — Progress Notes (Unsigned)
 [place] - [place] - [place] - [place] - [place]   Follow-up Note  Referring Provider: Avelina Greig BRAVO, MD Primary Provider: Avelina Greig BRAVO, MD Date of Office Visit: 06/09/2024  Subjective:   [name] (DOB: 05-21-1946) is a 78 y.o. female who [facility] in re-evaluation of the following:  HPI: [name] returns to this clinic in evaluation of asthma, history of pulmonary hypertension/diastolic dysfunction, allergic rhinitis, LPR, esophageal dysmotility, and food allergy directed against shellfish and fish.  I last saw her in this clinic 17 March 2024.  She has really done well with her airway and has not required either systemic steroid or antibiotic for any type of airway issue and rarely uses a short acting bronchodilator while she continues on her tezepelumab  injections along with Advair  utilized mostly 1 time per day and montelukast .  She believes that her reflux is under good control while using her pantoprazole  and famotidine .  She does have some problems with gas but no problems with classic reflux symptoms and no problems with any throat issues.  Her swallowing is doing well at this point.  She does not eat fish or shellfish.  She has obtained this years flu vaccine.  Since I have seen her in this clinic she has had amputation of a toe in her right foot secondary to osteomyelitis.  Allergies as of 06/09/2024       Reactions   Dilantin [phenytoin] Swelling   Facial swelling   Latex Hives   Penicillins Nausea And Vomiting, Swelling   Roxicodone  [oxycodone ] Nausea And Vomiting, Other (See Comments)   Abdominal pain   Codeine  Nausea Only   Tolerates Tylenol  with codeine  combination   Hydrocodone  Nausea And Vomiting   Lyrica [pregabalin] Itching, Other (See Comments)   Headaches Vision changes   Nickel Hives, Rash        Medication List    acetaminophen  500 MG tablet Commonly known as: TYLENOL  Take  1,000 mg by mouth 2 (two) times daily as needed for fever or headache (pain).   Advair  HFA 230-21 MCG/ACT inhaler Generic drug: fluticasone -salmeterol Inhale 2 puffs into the lungs 2 (two) times daily. On empty lungs   albuterol  108 (90 Base) MCG/ACT inhaler Commonly known as: VENTOLIN  HFA Inhale 2 puffs into the lungs every 6 (six) hours as needed for wheezing or shortness of breath (Take with Flovent  During Flare Ups.).   albuterol  (2.5 MG/3ML) 0.083% nebulizer solution Commonly known as: PROVENTIL  Take 3 mLs (2.5 mg total) by nebulization every 6 (six) hours as needed for wheezing or shortness of breath.   anastrozole  1 MG tablet Commonly known as: ARIMIDEX  Take 1 tablet (1 mg total) by mouth daily.   atorvastatin  10 MG tablet Commonly known as: LIPITOR TAKE 1 TABLET(10 MG) BY MOUTH DAILY   desloratadine  5 MG tablet Commonly known as: CLARINEX  TAKE 1 TABLET(5 MG) BY MOUTH DAILY   dextromethorphan-guaiFENesin  30-600 MG 12hr tablet Commonly known as: MUCINEX  DM Take 1 tablet by mouth daily.   EPINEPHrine  0.3 mg/0.3 mL Soaj injection Commonly known as: EPI-PEN Inject 0.3 mg into the muscle as needed for anaphylaxis. USE AS DIRECTED FOR LIFE THREATENING ALLERGIC REACTIONS   famotidine  40 MG tablet Commonly known as: PEPCID  Take 1 tablet (40 mg total) by mouth at bedtime. Take 1 (ONE) tablet by mouth every evening.   fluticasone  220 MCG/ACT inhaler Commonly known as: Flovent  HFA Inhale 2 puffs into the lungs every 6 (six) hours as needed.  Use with Albuterol  during flare ups.   gabapentin  300 MG capsule Commonly known as: NEURONTIN  TAKE 1 CAPSULE BY MOUTH IN THE MORNING, 1 CAPSULE AT LUNCH AND 2 CAPSULES AT BEDTIME   Gemtesa  75 MG Tabs Generic drug: Vibegron  1 tablet daily   glucose blood test strip Use to check blood sugar up to 2 times a day   Glyxambi  10-5 MG Tabs Generic drug: Empagliflozin -linaGLIPtin  TAKE 1 TABLET BY MOUTH DAILY   hydrOXYzine  10 MG  tablet Commonly known as: ATARAX  Take 1 tablet (10 mg total) by mouth daily as needed for itching.   ipratropium 0.06 % nasal spray Commonly known as: ATROVENT  Place 2 sprays into both nostrils 2 (two) times daily as needed for rhinitis.   irbesartan  150 MG tablet Commonly known as: AVAPRO  TAKE 1 TABLET(150 MG) BY MOUTH DAILY   methimazole  5 MG tablet Commonly known as: TAPAZOLE  Take 0.5 tablets (2.5 mg total) by mouth daily.   montelukast  10 MG tablet Commonly known as: SINGULAIR  Take 1 tablet (10 mg total) by mouth at bedtime. For asthma control.   neomycin 500 MG tablet Commonly known as: MYCIFRADIN Take 500 mg by mouth 2 (two) times daily. 10 day course - start after completing metronidazole .   nystatin  cream Commonly known as: MYCOSTATIN  Apply 1 Application topically 2 (two) times daily.   pantoprazole  40 MG tablet Commonly known as: Protonix  Take 1 tablet (40 mg total) by mouth 2 (two) times daily.   PATADAY OP Place 1 drop into both eyes 2 (two) times daily as needed (eye irritation).   polyethylene glycol powder 17 GM/SCOOP powder Commonly known as: GLYCOLAX /MIRALAX  Take 17 g by mouth daily as needed (no BM in 24 hours).   Stool Softener/Laxative 50-8.6 MG tablet Generic drug: senna-docusate Take 2 tablets by mouth at bedtime as needed for moderate constipation.   SYSTANE OP Place 1 drop into both eyes daily as needed (dry eyes).   Xarelto  20 MG Tabs tablet Generic drug: rivaroxaban  TAKE 1 TABLET(20 MG) BY MOUTH DAILY WITH SUPPER    Past Medical History:  Diagnosis Date   Allergic rhinitis    Allergy    Arthritis    Asthma    Breast cancer (HCC)    Chronic headache    Diabetes mellitus    Ductal carcinoma in situ (DCIS) of left breast 02/23/2022   DVT (deep venous thrombosis) (HCC)    Dyspnea    Heart murmur    History of radiation therapy    Left Breast 05/02/22-05/30/22- Dr. Lynwood Nasuti   Hypertension    Penicillin allergy 01/19/2020    Pulmonary embolism (HCC)    Pulmonary hypertension (HCC)    Seizures (HCC)     Past Surgical History:  Procedure Laterality Date   ABDOMINAL HYSTERECTOMY     partial, has ovaries   AMPUTATION TOE Right 04/22/2024   Procedure: AMPUTATION, TOE;  Surgeon: Malvin Marsa FALCON, DPM;  Location: [facility] OR;  Service: Orthopedics/Podiatry;  Laterality: Right;  RIGHT 3RD TOE AMPUTATION   BREAST LUMPECTOMY WITH RADIOACTIVE SEED LOCALIZATION Left 03/29/2022   Procedure: LEFT BREAST LUMPECTOMY WITH RADIOACTIVE SEED LOCALIZATION;  Surgeon: Vanderbilt Ned, MD;  Location: [facility] OR;  Service: General;  Laterality: Left;   CARDIAC CATHETERIZATION  12/28/2010   Mod. pulmonary hypertension, normal coronary arteries   CARDIOVERSION N/A 11/23/2020   Procedure: CARDIOVERSION;  Surgeon: Francyne Headland, MD;  Location: [facility] ENDOSCOPY;  Service: Cardiovascular;  Laterality: N/A;   CHOLECYSTECTOMY N/A 12/20/2021   Procedure: LAPAROSCOPIC CHOLECYSTECTOMY WITH INTRAOPERATIVE CHOLANGIOGRAM;  Surgeon: Eletha Boas, MD;  Location: [facility] ORS;  Service: General;  Laterality: N/A;   DOPPLER ECHOCARDIOGRAPHY  10/08/2011   EF=>55%,mild asymmetric LVH, mod. TR, mod. PH, mild to mod LA dilatation   ESOPHAGEAL MANOMETRY N/A 06/19/2023   Procedure: ESOPHAGEAL MANOMETRY (EM);  Surgeon: Saintclair Jasper, MD;  Location: [facility] ENDOSCOPY;  Service: Gastroenterology;  Laterality: N/A;   IR LUMBAR DISC ASPIRATION W/IMG GUIDE  01/12/2020   KNEE ARTHROSCOPY Left    KNEE SURGERY     LOWER EXTREMITY ANGIOGRAPHY N/A 04/20/2024   Procedure: Lower Extremity Angiography;  Surgeon: Pearline Norman RAMAN, MD;  Location[facility] INVASIVE CV LAB;  Service: Cardiovascular;  Laterality: N/A;   Nuclear Stress Test  05/20/2006   No ischemia   PARTIAL HYSTERECTOMY     PLANTAR FASCIA SURGERY     TONSILLECTOMY      Review of systems negative except as noted in HPI / PMHx or noted below:  Review of Systems  Constitutional: Negative.   HENT: Negative.    Eyes: Negative.    Respiratory: Negative.    Cardiovascular: Negative.   Gastrointestinal: Negative.   Genitourinary: Negative.   Musculoskeletal: Negative.   Skin: Negative.   Neurological: Negative.   Endo/Heme/Allergies: Negative.   Psychiatric/Behavioral: Negative.       Objective:   Vitals:   06/09/24 1344  BP: 132/70  Pulse: 91  Resp: 18  Temp: (!) 97.2 F (36.2 C)  SpO2: 98%          Physical Exam Constitutional:      Appearance: She is not diaphoretic.  HENT:     Head: Normocephalic.     Right Ear: Tympanic membrane, ear canal and external ear normal.     Left Ear: Tympanic membrane, ear canal and external ear normal.     Nose: Nose normal. No mucosal edema or rhinorrhea.     Mouth/Throat:     Pharynx: Uvula midline. No oropharyngeal exudate.  Eyes:     Conjunctiva/sclera: Conjunctivae normal.  Neck:     Thyroid : No thyromegaly.     Trachea: Trachea normal. No tracheal tenderness or tracheal deviation.  Cardiovascular:     Rate and Rhythm: Normal rate and regular rhythm.     Heart sounds: Normal heart sounds, S1 normal and S2 normal. No murmur heard. Pulmonary:     Effort: No respiratory distress.     Breath sounds: Normal breath sounds. No stridor. No wheezing or rales.  Lymphadenopathy:     Head:     Right side of head: No tonsillar adenopathy.     Left side of head: No tonsillar adenopathy.     Cervical: No cervical adenopathy.  Skin:    Findings: No erythema or rash.     Nails: There is no clubbing.  Neurological:     Mental Status: She is alert.     Diagnostics:    Spirometry was performed and demonstrated an FEV1 of 1.86 at 112 % of predicted.  The patient had an Asthma Control Test with the following results: ACT Total Score: 23.    Assessment and Plan:   1. Asthma, severe persistent, well-controlled (HCC)   2. Other allergic rhinitis   3. LPRD (laryngopharyngeal reflux disease)   4. Adverse food reaction, subsequent encounter    1. Continue to  treat and prevent airway inflammation:  A.  Advair  230 - 2 inhalations 1-2 times a day (empty lungs)  B.  Montelukast  10mg  - 1 tablet 1 time per day.  C.  Tezepelumab   injections every 4 weeks.    2. Continue to treat reflux / swallowing issue:  A. Pantoprazole  40mg  - 2 times per day B. Famotidine  40 mg in PM C. Replace throat clearing with swallowing / drinking maneuver  3. If needed:   A. Nasal saline several times per day  B. Albuterol  + Fluticasone  220 - 2 inhalations TOGETHER every 6 hours  C. Albuterol  neb + Fluticasone  220 - 2 inhalations TOGETHER every 6 hours  D. Epi-pen  4. Obtain Covid vaccine    5. Return to clinic in 12 weeks or earlier if there is a problem.  6. Influenza = Tamiflu. Covid = molnupiravir  Tamaira appears to be doing quite well and her airway disease has been very stable on her current plan of using anti-TSLP antibody along with some anti-inflammatory agents for her airway.  And her reflux and throat issue is under good control on her current plan of proton pump inhibitor and H2 receptor blocker.  Assuming she continues to do well we will see her back in this clinic in 12 weeks or earlier if there is a problem.   Camellia Denis, MD Allergy / Immunology [facility]

## 2024-06-10 ENCOUNTER — Encounter: Payer: Self-pay | Admitting: Allergy and Immunology

## 2024-06-12 DIAGNOSIS — Z23 Encounter for immunization: Secondary | ICD-10-CM | POA: Diagnosis not present

## 2024-06-15 DIAGNOSIS — Z96651 Presence of right artificial knee joint: Secondary | ICD-10-CM | POA: Diagnosis not present

## 2024-06-17 ENCOUNTER — Ambulatory Visit: Admitting: Podiatry

## 2024-06-17 DIAGNOSIS — E1142 Type 2 diabetes mellitus with diabetic polyneuropathy: Secondary | ICD-10-CM | POA: Diagnosis not present

## 2024-06-17 NOTE — Progress Notes (Signed)
 She presents today today complaining of pain to the fifth digit of her left foot she states that there is some itching but that sharp shooting pain will come through that fifth toe occasionally.  Objective: Vital signs are stable she is alert and oriented x 3.  When discussing this with her she states that she gets sharp shooting pain in all of the toes and part of the foot as well.  States that is not just located within the fifth toe all the time.  Assessment: Neuropathy.  Plan: Follow-up with Dr. May on a routine basis.

## 2024-06-22 ENCOUNTER — Ambulatory Visit

## 2024-06-22 DIAGNOSIS — D2372 Other benign neoplasm of skin of left lower limb, including hip: Secondary | ICD-10-CM

## 2024-06-22 DIAGNOSIS — Z89421 Acquired absence of other right toe(s): Secondary | ICD-10-CM

## 2024-06-22 NOTE — Progress Notes (Signed)
 Patient presents for orthotic adjustment-    Orthotics are ajusted with felt padding to offload the forefoot-  I gave her extra to use on her other diabetic inserts.  She states this helps the discomfort she was feeling     She will see Dr. Gaynel in February and I recommended she consider starting the paperwork for diabetic shoes and inserts from [facility] at that visit or the visit after.

## 2024-06-29 ENCOUNTER — Encounter: Payer: Self-pay | Admitting: Cardiovascular Disease

## 2024-06-29 ENCOUNTER — Other Ambulatory Visit (HOSPITAL_COMMUNITY): Payer: Self-pay

## 2024-06-29 ENCOUNTER — Telehealth: Payer: Self-pay | Admitting: Pharmacy Technician

## 2024-06-29 ENCOUNTER — Ambulatory Visit: Attending: Cardiovascular Disease | Admitting: Cardiovascular Disease

## 2024-06-29 VITALS — BP 132/75 | HR 73 | Ht 63.0 in | Wt 208.4 lb

## 2024-06-29 DIAGNOSIS — J4540 Moderate persistent asthma, uncomplicated: Secondary | ICD-10-CM | POA: Diagnosis present

## 2024-06-29 DIAGNOSIS — E785 Hyperlipidemia, unspecified: Secondary | ICD-10-CM | POA: Diagnosis present

## 2024-06-29 DIAGNOSIS — I480 Paroxysmal atrial fibrillation: Secondary | ICD-10-CM | POA: Insufficient documentation

## 2024-06-29 DIAGNOSIS — I484 Atypical atrial flutter: Secondary | ICD-10-CM | POA: Diagnosis present

## 2024-06-29 DIAGNOSIS — I2721 Secondary pulmonary arterial hypertension: Secondary | ICD-10-CM | POA: Diagnosis present

## 2024-06-29 DIAGNOSIS — E1140 Type 2 diabetes mellitus with diabetic neuropathy, unspecified: Secondary | ICD-10-CM | POA: Insufficient documentation

## 2024-06-29 DIAGNOSIS — D6869 Other thrombophilia: Secondary | ICD-10-CM | POA: Diagnosis present

## 2024-06-29 DIAGNOSIS — G4739 Other sleep apnea: Secondary | ICD-10-CM | POA: Diagnosis present

## 2024-06-29 DIAGNOSIS — I495 Sick sinus syndrome: Secondary | ICD-10-CM | POA: Insufficient documentation

## 2024-06-29 DIAGNOSIS — I10 Essential (primary) hypertension: Secondary | ICD-10-CM | POA: Insufficient documentation

## 2024-06-29 DIAGNOSIS — Z86718 Personal history of other venous thrombosis and embolism: Secondary | ICD-10-CM | POA: Insufficient documentation

## 2024-06-29 DIAGNOSIS — I5032 Chronic diastolic (congestive) heart failure: Secondary | ICD-10-CM | POA: Diagnosis present

## 2024-06-29 MED ORDER — RIVAROXABAN 20 MG PO TABS: 20.0000 mg | ORAL_TABLET | Freq: Every day | ORAL | 3 refills | Status: AC

## 2024-06-29 NOTE — Telephone Encounter (Signed)
 I called [facility] and she just got 90 days worth on 05/26/24 and the copay was 129.00. [facility] is showing over 2000 right now because it is out of stock so the prescription has not hit the insurance. Once they get the medication in stock, then they process on the insurance. This will not be 2000 once it goes on the insurance but also it will come back too soon since she just got in November for 90 days. I called the patient to see about that November pick up but I had to leave a message on both numbers.

## 2024-06-29 NOTE — Patient Instructions (Signed)
 Medication Instructions:  No changes *If you need a refill on your cardiac medications before your next appointment, please call your pharmacy*  Lab Work: None ordered If you have labs (blood work) drawn today and your tests are completely normal, you will receive your results only by: MyChart Message (if you have MyChart) OR A paper copy in the mail If you have any lab test that is abnormal or we need to change your treatment, we will call you to review the results.  Testing/Procedures: None ordered  Follow-Up: At [facility], you and your health needs are our priority.  As part of our continuing mission to provide you with exceptional heart care, our providers are all part of one team.  This team includes your primary Cardiologist (physician) and Advanced Practice Providers or APPs (Physician Assistants and Nurse Practitioners) who all work together to provide you with the care you need, when you need it.  Your next appointment:   1 year(s)  Provider:   Jerel Balding, MD    We recommend signing up for the patient portal called MyChart.  Sign up information is provided on this After Visit Summary.  MyChart is used to connect with patients for Virtual Visits (Telemedicine).  Patients are able to view lab/test results, encounter notes, upcoming appointments, etc.  Non-urgent messages can be sent to your provider as well.   To learn more about what you can do with MyChart, go to [email or web address].

## 2024-06-29 NOTE — Progress Notes (Unsigned)
 Cardiology Office Note    Date:  06/29/2024   ID:  [name], [name] 02-10-1946, MRN [id number]  PCP:  Avelina Greig BRAVO, MD  Cardiologist:   Jerel Balding, MD    No chief complaint on file.   History of Present Illness:  [name] is a 78 y.o. female with history of persistent atrial flutter recurrent after DCCV 11/23/2020, paroxysmal atrial fibrillation, remote DVT/PE, moderate pulmonary hypertension, chronic reactive airway disease and degenerative joint disease returning for follow-up.  In 2023 she had knee replacement surgery, emergency cholecystectomy and left breast cancer lumpectomy followed by radiation therapy.  This year has been much better.  She has not had any serious medical illnesses.  She is getting along well, living independently she does complain of dyspnea when the weather is hot, but is much better now in the fall.  She has occasional very mild pedal edema.  She denies orthopnea, PND, chest pain at rest or with activity, palpitations, dizziness and syncope.  She nicked her toe cutting her toenails, bleeding would not stop so she extracted her to the emergency room.  Otherwise has not had any bleeding problems.  Has not had falls.  She is metabolically well-controlled with the most recent LDL 40, HDL 56, hemoglobin A1c 5.6%.  Her last TSH performed just 2 weeks ago was 3.450.  She is no longer on methimazole  and has an appointment with her endocrinologist later this month.  The last time she was clearly in sinus rhythm was back in October 2023 when she had sinus bradycardia.  Since then she has had several ECGs that show atrial flutter with spontaneously controlled ventricular response, most commonly 4: 1 AV block with a ventricular rate around 70 bpm.  She is completely oblivious to the arrhythmia and it does not seem to have any impact on her functional status.     Previous coronary angiography showed no evidence of CAD but she did have moderate pulmonary  artery hypertension by right heart catheterization (systolic PAP 50 mmHg).  She has had a variety of noncardiac issues including recurrent issues with diarrhea (she is seeing Dr. Donnald) and continued hyperthyroidism controlled on methimazole  (she is seeing Dr. Trixie).    She continues to have issues with thoracic spine related pain (Dr. Heide). She has had a previous equivocal workup for hypercoagulable conditions (lupus anticoagulant positive on one of 2 separate assays, protein S activity decreased with normal total protein S level).  She had elective right knee replacement surgery in March 2023, cholecystectomy for acute cholecystitis in June 2023 and left breast lumpectomy for cancer in September 2023.   Allergies:   Dilantin [phenytoin], Latex, Penicillins, Roxicodone  [oxycodone ], Codeine , Hydrocodone , Lyrica [pregabalin], and Nickel   ROS:   Please see the history of present illness.    ROS All other systems reviewed and are negative.   PHYSICAL EXAM:   VS:  BP 132/75 (BP Location: Left Arm, Patient Position: Sitting, Cuff Size: Large)   Pulse 73   Ht 5' 3 (1.6 m)   Wt 208 lb 6.4 oz (94.5 kg)   SpO2 95%   BMI 36.92 kg/m      General: Alert, oriented x3, no distress, mildly obese Head: no evidence of trauma, PERRL, EOMI, no exophtalmos or lid lag, no myxedema, no xanthelasma; normal ears, nose and oropharynx Neck: normal jugular venous pulsations with visible flutter waves; brisk carotid pulses without delay and no carotid bruits Chest: clear to auscultation, no signs of  consolidation by percussion or palpation, normal fremitus, symmetrical and full respiratory excursions Cardiovascular: normal position and quality of the apical impulse, regular rhythm background with periods of irregularity, normal first and second heart sounds, no murmurs, rubs or gallops Abdomen: no tenderness or distention, no masses by palpation, no abnormal pulsatility or arterial bruits, normal bowel  sounds, no hepatosplenomegaly Extremities: no clubbing, cyanosis or edema; 2+ radial, ulnar and brachial pulses bilaterally; 2+ right femoral, posterior tibial and dorsalis pedis pulses; 2+ left femoral, posterior tibial and dorsalis pedis pulses; no subclavian or femoral bruits Neurological: grossly nonfocal Psych: Normal mood and affect    Wt Readings from Last 3 Encounters:  06/29/24 208 lb 6.4 oz (94.5 kg)  06/02/24 198 lb 8 oz (90 kg)  05/22/24 193 lb 12.8 oz (87.9 kg)      Studies/Labs Reviewed:  Echocardiogram 11/07/2020   1. Rhythm appears to be atrial flutter with variable conduction. Left  ventricular ejection fraction, by estimation, is 60 to 65%. The left  ventricle has normal function. The left ventricle has no regional wall  motion abnormalities. There is moderate  asymmetric left ventricular hypertrophy of the basal-septal segment. Left  ventricular diastolic parameters are indeterminate.   2. Right ventricular systolic function is normal. The right ventricular  size is normal. There is mildly elevated pulmonary artery systolic  pressure. The estimated right ventricular systolic pressure is 44.0 mmHg.   3. Left atrial size was mildly dilated.   4. Right atrial size was moderately dilated.   5. The mitral valve is normal in structure. Trivial mitral valve  regurgitation. No evidence of mitral stenosis.   6. Tricuspid valve regurgitation is moderate.   7. The aortic valve is tricuspid. Aortic valve regurgitation is not  visualized. No aortic stenosis is present.   8. The inferior vena cava is normal in size with greater than 50%  respiratory variability, suggesting right atrial pressure of 3 mmHg.   EKG:  EKG is not ordered today.  Personally reviewed the ECG from 04/27/2024 which shows sinus rhythm with a single PAC and incomplete right bundle branch block.  Recent Labs: 04/18/2024: ALT 10 04/23/2024: Magnesium  1.6 04/27/2024: BUN 6; Creatinine, Ser 0.81;  Hemoglobin 11.8; Platelets 155; Potassium 3.7; Sodium 141 05/04/2024: TSH 3.50   Lipid Panel    Component Value Date/Time   CHOL 94 04/21/2024 1027   TRIG 39 04/21/2024 1027   HDL 53 04/21/2024 1027   CHOLHDL 1.8 04/21/2024 1027   VLDL 8 04/21/2024 1027   LDLCALC 33 04/21/2024 1027     ASSESSMENT:    No diagnosis found.     PLAN:  In order of problems listed above:  Paroxysmal atrial fibrillation and persistent atrial lutter: It appears that she has been in atrial flutter persistently for possibly as long as a year now.  She is not aware of palpitations and the arrhythmia seems to have no impact on her functional status.  CHADSVasc 4 (age, gender, HTN, DM, CHF).  She also has a previous history of paroxysmal atrial fibrillation.  We did not do an EKG today, but her physical exam is strongly suggestive of atrial flutter with variable AV block.  The ventricular rate is spontaneously rate controlled, suggesting she has some degree of AV node disease.   There appears to be no compelling reason to proceed with cardioversion. SSS/ AV block: She is not on any medications with negative chronotropic, is clinically euthyroid and has evidence of both sinus node dysfunction and AV no disease.  It is highly likely that she will eventually require pacemaker, but this is not imminent.  CHF: This was only a problem when she had a prolonged episode of persistent atrial flutter and she is currently asymptomatic, not requiring diuretics.  On SGLT2 inhibitor.  She has preserved left ventricular systolic function.  Aware of the need to avoid sodium rich foods and she is monitoring her weight regularly.  Should call us  for rapid weight gain. HTN:   Well-controlled.  On irbesartan . Hx DVT/PE: Questionable hypercoagulable state, but she requires lifelong anticoagulation for atrial arrhythmia anyway.   PAH: Currently asymptomatic other than occasional mild pedal edema.  Probably multifactorial related to left  heart diastolic dysfunction, previous pulm embolism, longstanding obesity.  Mild, with estimated systolic PA pressure of on the most recent echocardiogram, similar to 2016 when it was 46 mmHg.   DM: Excellent control, on empagliflozin . HLP: Excellent on current regimen.  Continue atorvastatin . Obesity: Remains in moderately obese range. Complex sleep apnea: Reports compliance with CPAP and denies daytime hypersomnolence (via [facility]). Xarelto : Bleeding complications but these have been relatively minor and easily controlled.  Anticoagulation is indicated both for atrial fibrillation and history of venous thromboembolism.  Asthma: Avoid nonselective beta-blockers. On Xolair , Spiriva , albuterol /levalbuterol , montelukast , inhaled steroids/LABA (Advair ). Dr. Kozlow. Hyperthyroidism: Currently euthyroid clinically and based on recent TSH. Left breast cancer status postlumpectomy 03/29/2022 and subsequent radiation therapy   Medication Adjustments/Labs and Tests Ordered: Current medicines are reviewed at length with the patient today.  Concerns regarding medicines are outlined above.  Medication changes, Labs and Tests ordered today are listed in the Patient Instructions below: There are no Patient Instructions on file for this visit.   SignedJerel Balding, MD  06/29/2024 9:39 AM     [address] [place], [place]  [address] Phone: [phone]; Fax: [phone]

## 2024-06-30 NOTE — Telephone Encounter (Signed)
 The patient called and said she did get the medication in nov and she just wanted this fill on file.

## 2024-06-30 NOTE — Telephone Encounter (Signed)
 I called [phone]  and lmom   I called [phone] and lmom

## 2024-07-02 ENCOUNTER — Telehealth: Admitting: Adult Health

## 2024-07-03 ENCOUNTER — Ambulatory Visit

## 2024-07-03 DIAGNOSIS — J4550 Severe persistent asthma, uncomplicated: Secondary | ICD-10-CM

## 2024-07-07 ENCOUNTER — Encounter: Payer: Self-pay | Admitting: Hematology and Oncology

## 2024-07-14 ENCOUNTER — Other Ambulatory Visit: Payer: Self-pay | Admitting: Cardiovascular Disease

## 2024-07-15 ENCOUNTER — Ambulatory Visit: Payer: Self-pay

## 2024-07-15 NOTE — Telephone Encounter (Signed)
 Next Appt With Family Medicine Darra Ring, MD) 07/21/2024 at 10:20 AM

## 2024-07-15 NOTE — Telephone Encounter (Signed)
 FYI Only or Action Required?: FYI only for provider: appointment scheduled on 07/20/24.  Patient was last seen in primary care on 06/02/2024 by Avelina Greig BRAVO, MD.  Called Nurse Triage reporting Shoulder Pain.  Symptoms began yesterday.  Interventions attempted: Ice/heat application.  Symptoms are: unchanged.  Triage Disposition: See PCP When Office is Open (Within 3 Days)  Patient/caregiver understands and will follow disposition?: Yes    Copied from CRM #[id number]. Topic: Clinical - Red Word Triage >> Jul 15, 2024 11:07 AM Donna BRAVO wrote: Red Word that prompted transfer to Nurse Triage:  Symptoms: -left shoulder pain started Tuesday around noon -upper shoulder and back of shoulder  -continuous pain -no tingling/numbness    Reason for Disposition  [1] MODERATE pain (e.g., interferes with normal activities) AND [2] present > 3 days  Answer Assessment - Initial Assessment Questions Pt called in to report continuous L shoulder pain x 24 hours. Pt denies distress, no increased HR/BP, no dizziness, h/a, no numbness or tingling in extremities, no chest pain or SOB. Pt states she has  a hx of L shoulder pain and used to take pain medication. Pt states she did use heat last night to aid in symptom relief. Recommended continued use of heat and addition of tylenol  or topical icy hot or blue emu. Pt did report having a fall a week and a half ago; reports she tripped over an object, no dizziness or lightheadedness, no injury from fall. Pt denies hitting her head during fall. Appointment scheduled for evaluation. Patient agrees with plan of care, and will call back if anything changes, or if symptoms worsen.     1. ONSET: When did the pain start?     Tuesday, 12/30  2. LOCATION: Where is the pain located?     L shoulder  3. PAIN: How bad is the pain? (Scale 1-10; or mild, moderate, severe)     Continuous moderate pain   4. WORK OR EXERCISE: Has there been any recent work or  exercise that involved this part of the body?     Pt reports a fall a week and a half ago; states she tripped but has a hx of L shoulder pain so she does not feel that events are related   5. CAUSE: What do you think is causing the shoulder pain?     Hx of L shoulder pain; used to take pain medication   6. OTHER SYMPTOMS: Do you have any other symptoms? (e.g., neck pain, swelling, rash, fever, numbness, weakness)     None  Protocols used: Shoulder Pain-A-AH

## 2024-07-19 ENCOUNTER — Other Ambulatory Visit: Payer: Self-pay | Admitting: Family Medicine

## 2024-07-20 ENCOUNTER — Telehealth: Payer: Self-pay | Admitting: *Deleted

## 2024-07-20 MED ORDER — ACETAMINOPHEN-CODEINE 300-30 MG PO TABS: 1.0000 | ORAL_TABLET | Freq: Two times a day (BID) | ORAL | 0 refills | Status: AC | PRN

## 2024-07-20 NOTE — Telephone Encounter (Signed)
 Spoke with Ms. Bujak.  She states when  she was in for her physical 05/22/24, Dr. Avelina prescribed her some Tylenol  #3 #20.  She states she still  has 4 tablets left but she is having more pain recently with her knee and shoulder.  She states Dr. Avelina told her to call back in for refill if she continues to have pain.  Tylenol  #3 not on current medication list.  Pharmacy [facility] [place].  I addressed MyChart questions patient had during out phone call.

## 2024-07-20 NOTE — Telephone Encounter (Signed)
 Ms. [name] notified as instructed by telephone.  Patient states understanding.

## 2024-07-20 NOTE — Telephone Encounter (Signed)
 Refilled for patient let her know that if she feels this may need to be a long-term medication, we will need to set up every 40-month office visits for chronic pain management, controlled substance contract and drug testing etc. given the codeine  component .SABRA  From here on out.

## 2024-07-20 NOTE — Telephone Encounter (Signed)
 Copied from CRM [id number]. Topic: Clinical - Medical Advice >> Jul 20, 2024  8:57 AM Rea ORN wrote: Reason for CRM: Pt would like Arland to call her back regarding pain medication. She stated she has a question about the dosage. She also wanted to discuss Mychart.    Please call back, [phone]

## 2024-07-20 NOTE — Addendum Note (Signed)
 Addended by: AVELINA NO E on: 07/20/2024 02:18 PM   Modules accepted: Orders

## 2024-07-21 ENCOUNTER — Ambulatory Visit: Admitting: Family Medicine

## 2024-07-27 ENCOUNTER — Ambulatory Visit: Payer: Self-pay

## 2024-07-27 ENCOUNTER — Telehealth: Payer: Self-pay | Admitting: Allergy and Immunology

## 2024-07-27 MED ORDER — IPRATROPIUM BROMIDE 0.06 % NA SOLN: NASAL | 1 refills | Status: AC

## 2024-07-27 MED ORDER — FLUTICASONE PROPIONATE HFA 220 MCG/ACT IN AERO: 2.0000 | INHALATION_SPRAY | Freq: Four times a day (QID) | RESPIRATORY_TRACT | 1 refills | Status: AC | PRN

## 2024-07-27 MED ORDER — ALBUTEROL SULFATE (2.5 MG/3ML) 0.083% IN NEBU: INHALATION_SOLUTION | RESPIRATORY_TRACT | 1 refills | Status: DC

## 2024-07-27 MED ORDER — ALBUTEROL SULFATE HFA 108 (90 BASE) MCG/ACT IN AERS: 2.0000 | INHALATION_SPRAY | Freq: Four times a day (QID) | RESPIRATORY_TRACT | 1 refills | Status: AC | PRN

## 2024-07-27 NOTE — Telephone Encounter (Signed)
 FYI Only or Action Required?: FYI only for provider: Patient going to urgent care.  Patient was last seen in primary care on 06/02/2024 by Avelina Greig BRAVO, MD.  Called Nurse Triage reporting Hemoptysis.  Symptoms began today.  Interventions attempted: Nothing.  Symptoms are: stable.  Triage Disposition: See Physician Within 24 Hours  Patient/caregiver understands and will follow disposition?: Yes  Reason for Disposition  Taking Coumadin (warfarin) or other strong blood thinner, or known bleeding disorder (e.g., thrombocytopenia)  Answer Assessment - Initial Assessment Questions Hx of blood clot in left lung, 2004. Patient takes Xarelto . No available appointments at PCP office, patient denied OV at alternate office, advised UC today. Given [facility] Information,.   1. ONSET: When did the cough begin?      Today  2. SPUTUM: Describe the color of your sputum (e.g., none, dry cough; clear, white, yellow, green)     Denies   3. HEMOPTYSIS: How much blood? (e.g., flecks, streaks, tablespoons)     1 big chunk of bright red blood, blueberry sized  4. DIFFICULTY BREATHING: Are you having difficulty breathing? If Yes, ask: How bad is it? (e.g., mild, moderate, severe)      Denies  5. FEVER: Do you have a fever? If Yes, ask: What is your temperature, how was it measured, and when did it start?     Denies  6. CARDIAC HISTORY: Do you have any history of heart disease? (e.g., heart attack, congestive heart failure)      Hypertension   7. LUNG HISTORY: Do you have any history of lung disease?  (e.g., pulmonary embolus, asthma, emphysema)     Asthma  8. OTHER SYMPTOMS: Do you have any other symptoms? (e.g., runny nose, wheezing, chest pain)       Wheezing the other night, did neb solution, Called Asthma on call doctor and they advised to increase inhalers.  Protocols used: Coughing Up Blood-A-AH  Copied from CRM #[id number]. Topic: Clinical - Red  Word Triage >> Jul 27, 2024  2:10 PM Burnard DEL wrote: Red Word that prompted transfer to Nurse Triage: spit up blood

## 2024-07-27 NOTE — Telephone Encounter (Signed)
 Pt at UC now, requesting acute visit for tomorrow with a provider at alternate location  Copied from CRM #[id number]. Topic: Clinical - Red Word Triage >> Jul 27, 2024  2:10 PM Burnard DEL wrote: Red Word that prompted transfer to Nurse Triage: spit up blood >> Jul 27, 2024  3:25 PM Robinson H wrote: Patient calling back to speak with NT regarding scheduling an appointment. Reason for Disposition  Caller has already spoken with another triager and has no further questions.  Protocols used: No Contact or Duplicate Contact Call-A-AH

## 2024-07-27 NOTE — Telephone Encounter (Signed)
 Called and informed patient of Dr. Rowan message.  She is planning to call her primary care doctor.   Also I will send in refills as requested by patient.

## 2024-07-27 NOTE — Telephone Encounter (Signed)
 Appointment scheduled with Mliss Spray 07/28/2024 at 11:20 am.

## 2024-07-27 NOTE — Telephone Encounter (Signed)
 Patient states she had been wheezing and started using her Advair  twice a day. She avoids clearing her throat but when she did today, she spit up bloody mucous. She's not sure what this could be but wanted to know if Dr. Maurilio wanted to see her in [place] to take a look at her throat.

## 2024-07-28 ENCOUNTER — Ambulatory Visit (INDEPENDENT_AMBULATORY_CARE_PROVIDER_SITE_OTHER): Admitting: Nurse Practitioner

## 2024-07-28 ENCOUNTER — Encounter: Payer: Self-pay | Admitting: Family Medicine

## 2024-07-28 ENCOUNTER — Ambulatory Visit
Admission: RE | Admit: 2024-07-28 | Discharge: 2024-07-28 | Disposition: A | Discharge status: Home / Self Care | Source: Ambulatory Visit | Attending: Nurse Practitioner | Admitting: Nurse Practitioner

## 2024-07-28 ENCOUNTER — Ambulatory Visit: Payer: Self-pay | Admitting: Nurse Practitioner

## 2024-07-28 ENCOUNTER — Encounter: Payer: Self-pay | Admitting: Nurse Practitioner

## 2024-07-28 VITALS — BP 134/80 | HR 73 | Temp 97.8°F | Ht 63.0 in | Wt 203.0 lb

## 2024-07-28 DIAGNOSIS — J189 Pneumonia, unspecified organism: Secondary | ICD-10-CM

## 2024-07-28 DIAGNOSIS — R0789 Other chest pain: Secondary | ICD-10-CM | POA: Diagnosis not present

## 2024-07-28 DIAGNOSIS — R042 Hemoptysis: Secondary | ICD-10-CM | POA: Insufficient documentation

## 2024-07-28 DIAGNOSIS — Z86711 Personal history of pulmonary embolism: Secondary | ICD-10-CM | POA: Insufficient documentation

## 2024-07-28 DIAGNOSIS — T3695XA Adverse effect of unspecified systemic antibiotic, initial encounter: Secondary | ICD-10-CM

## 2024-07-28 LAB — POCT I-STAT CREATININE: Creatinine, Ser: 1 mg/dL (ref 0.44–1.00)

## 2024-07-28 MED ORDER — LEVOFLOXACIN 750 MG PO TABS: 750.0000 mg | ORAL_TABLET | Freq: Every day | ORAL | 0 refills | Status: DC

## 2024-07-28 MED ORDER — FLUCONAZOLE 150 MG PO TABS: 150.0000 mg | ORAL_TABLET | ORAL | 0 refills | Status: AC | PRN

## 2024-07-28 MED ORDER — IOHEXOL 350 MG/ML SOLN
75.0000 mL | Freq: Once | INTRAVENOUS | Status: AC | PRN
  Administered 2024-07-28: 75 mL via INTRAVENOUS

## 2024-07-28 NOTE — Addendum Note (Signed)
 Addended by: Mackena Plummer F on: 07/28/2024 12:11 PM   Modules accepted: Orders

## 2024-07-28 NOTE — Progress Notes (Signed)
 "  BP 134/80   Pulse 73   Temp 97.8 F (36.6 C)   Ht 5' 3 (1.6 m)   Wt 203 lb (92.1 kg)   SpO2 97%   BMI 35.96 kg/m    Subjective:    Patient ID: [name], female    DOB: 1946-04-30, 79 y.o.   MRN: [id number]  HPI: [name] is a 79 y.o. female  Chief Complaint  Patient presents with   Hemoptysis    Pt states she coughed up blood yesterday. Denies chest pain.    Discussed the use of AI scribe software for clinical note transcription with the patient, who gave verbal consent to proceed.  History of Present Illness [name] is a 79 year old female with asthma and atrial fibrillation who presents with hemoptysis.  Hemoptysis - Episodes of hemoptysis with blood mixed with mucus, primarily occurring in the morning for a few weeks -yesterday coughed up more blood then usual,  approximately the size of a blueberry -denies shortness of breath currently -denies chest pain but does report right sided rib and back pain - Hemoptysis begins with mucus and blood upon waking - Attempts to swallow or drink water do not prevent expelling the mixture, which is mainly blood  Asthma and respiratory symptoms - History of asthma - Current medications: Advair , albuterol  inhaler, Spiriva , Flovent  - Recent increase in wheezing, particularly on a Friday night - Increased Advair  to twice daily for two weeks, resulting in improvement of wheezing - Continues to use inhalers twice daily - No chest pain or shortness of breath  Anticoagulation and thromboembolic history - History of atrial fibrillation - On Xarelto  nightly - History of pulmonary embolism in 2004 - History of deep vein thrombosis following orthopedic surgery on left leg in November   Musculoskeletal pain - Right-sided back/rib pain  Recent infection and surgical intervention - Recent toe amputation due to severe bone infection - Infection not detected by x-ray, confirmed by MRI during hospital stay -  Infection progressed rapidly from a small spot to entire toe within one day - Treated with antibiotics - Typically develops yeast infection with antibiotics, but did not experience one this time         05/19/2024    9:54 AM 04/30/2024   11:59 AM 03/20/2024    2:38 PM  Depression screen PHQ 2/9  Decreased Interest 0 0 0  Down, Depressed, Hopeless 0 0 0  PHQ - 2 Score 0 0 0  Altered sleeping  0 0  Tired, decreased energy  0 0  Change in appetite  0 0  Feeling bad or failure about yourself   0 0  Trouble concentrating  0 0  Moving slowly or fidgety/restless  0 0  Suicidal thoughts  0 0  PHQ-9 Score  0  0   Difficult doing work/chores Not difficult at all Not difficult at all Somewhat difficult     Data saved with a previous flowsheet row definition    Relevant past medical, surgical, family and social history reviewed and updated as indicated. Interim medical history since our last visit reviewed. Allergies and medications reviewed and updated.  Review of Systems  Ten systems reviewed and is negative except as mentioned in HPI      Objective:      BP 134/80   Pulse 73   Temp 97.8 F (36.6 C)   Ht 5' 3 (1.6 m)   Wt 203 lb (92.1 kg)   SpO2  97%   BMI 35.96 kg/m    Wt Readings from Last 3 Encounters:  07/28/24 203 lb (92.1 kg)  06/29/24 208 lb 6.4 oz (94.5 kg)  06/02/24 198 lb 8 oz (90 kg)    Physical Exam GENERAL: Alert, cooperative, well developed, no acute distress HEENT: Normocephalic, normal oropharynx, moist mucous membranes CHEST: Clear to auscultation bilaterally, No wheezes, rhonchi, or crackles CARDIOVASCULAR: Normal heart rate and rhythm, S1 and S2 normal without murmurs ABDOMEN: Soft, non-tender, non-distended, without organomegaly, Normal bowel sounds EXTREMITIES: No cyanosis or edema NEUROLOGICAL: Cranial nerves grossly intact, Moves all extremities without gross motor or sensory deficit  Results for orders placed or performed in visit on 05/22/24   HM DIABETES FOOT EXAM   Collection Time: 05/22/24 12:00 AM  Result Value Ref Range   HM Diabetic Foot Exam done   Microalbumin / creatinine urine ratio   Collection Time: 05/22/24 12:01 PM  Result Value Ref Range   Microalb, Ur <0.7 mg/dL   Creatinine,U 43.9 mg/dL   Microalb Creat Ratio Unable to calculate 0.0 - 30.0 mg/g   *Note: Due to a large number of results and/or encounters for the requested time period, some results have not been displayed. A complete set of results can be found in Results Review.          Assessment & Plan:   Problem List Items Addressed This Visit   None Visit Diagnoses       Hemoptysis    -  Primary   Relevant Orders   CT Angio Chest Pulmonary Embolism (PE) W or WO Contrast     History of pulmonary embolus (PE)       Relevant Orders   CT Angio Chest Pulmonary Embolism (PE) W or WO Contrast     Rib pain on right side            Assessment and Plan Assessment & Plan Hemoptysis Acute hemoptysis with mucus and blood production, primarily in the morning. No associated chest pain or dyspnea. Right-sided rib pain present. Differential diagnosis includes pulmonary embolism due to history of pulmonary embolism and current anticoagulation therapy. - Ordered chest CT  to evaluate for pulmonary embolism or other causes of hemoptysis.  Asthma Recent exacerbation characterized by wheezing, managed with increased use of Advair  and nebulizer treatments. Symptoms improved with twice-daily Advair  and nebulizer use. - Continue Advair  twice daily for two weeks. - Use nebulizer as needed for wheezing.  Atrial fibrillation Chronic atrial fibrillation managed with Xarelto  for anticoagulation.  History of pulmonary embolism Pulmonary embolism in 2004, currently on anticoagulation therapy with Xarelto .   For now hold xeralto till we get ct scan, follow up with pcp      Follow up plan: Return if symptoms worsen or fail to improve. "

## 2024-07-29 ENCOUNTER — Telehealth: Payer: Self-pay | Admitting: *Deleted

## 2024-07-29 NOTE — Telephone Encounter (Signed)
 Order removed

## 2024-07-29 NOTE — Addendum Note (Signed)
 Addended by: YVONE WARREN BROCKS on: 07/29/2024 03:52 PM   Modules accepted: Orders

## 2024-07-29 NOTE — Telephone Encounter (Signed)
 Copied from CRM [id number]. Topic: Clinical - Lab/Test Results >> Jul 28, 2024  3:33 PM Emylou G wrote: Reason for CRM: Pansy w/ [facility].. requesting us  to remove the order for the duplicate CT scan.. patient is at [facility] >> Jul 29, 2024 12:38 PM Rosaria A wrote: Routing to correct office.

## 2024-07-29 NOTE — Telephone Encounter (Signed)
 Did you see the second part of message patient sent?   Does she need to stay home a few days and does/when should she follow up with you?

## 2024-07-29 NOTE — Telephone Encounter (Signed)
 Copied from CRM [id number]. Topic: General - Other >> Jul 29, 2024  9:33 AM [name] wrote: Reason for CRM: Patient states she was diagnosed with pneumonia and is requesting to speak with Dr Del nurse.   Patient can be reached at [phone]

## 2024-07-30 ENCOUNTER — Other Ambulatory Visit: Payer: Self-pay | Admitting: *Deleted

## 2024-07-30 MED ORDER — ALBUTEROL SULFATE (2.5 MG/3ML) 0.083% IN NEBU: INHALATION_SOLUTION | RESPIRATORY_TRACT | 1 refills | Status: AC

## 2024-08-04 ENCOUNTER — Telehealth: Payer: Self-pay | Admitting: Allergy and Immunology

## 2024-08-04 ENCOUNTER — Ambulatory Visit: Admitting: Family Medicine

## 2024-08-04 ENCOUNTER — Ambulatory Visit

## 2024-08-04 VITALS — BP 110/60 | HR 88 | Temp 99.5°F | Ht 63.0 in | Wt 204.0 lb

## 2024-08-04 DIAGNOSIS — J189 Pneumonia, unspecified organism: Secondary | ICD-10-CM | POA: Insufficient documentation

## 2024-08-04 DIAGNOSIS — R9389 Abnormal findings on diagnostic imaging of other specified body structures: Secondary | ICD-10-CM | POA: Diagnosis not present

## 2024-08-04 DIAGNOSIS — M25511 Pain in right shoulder: Secondary | ICD-10-CM

## 2024-08-04 DIAGNOSIS — J4550 Severe persistent asthma, uncomplicated: Secondary | ICD-10-CM

## 2024-08-04 MED ORDER — ALPRAZOLAM 0.25 MG PO TABS: 0.2500 mg | ORAL_TABLET | Freq: Every day | ORAL | 0 refills | Status: AC | PRN

## 2024-08-04 MED ORDER — PREDNISONE 20 MG PO TABS: ORAL_TABLET | ORAL | 0 refills | Status: AC

## 2024-08-04 NOTE — Progress Notes (Unsigned)
 "   Patient ID: [name], female    DOB: [birth year], 79 y.o.   MRN: [id number]  This visit was conducted in person.  BP 110/60   Pulse 88   Temp 99.5 F (37.5 C) (Temporal)   Ht 5' 3 (1.6 m)   Wt 204 lb (92.5 kg)   SpO2 100%   BMI 36.14 kg/m    CC:  Chief Complaint  Patient presents with   Results    Follow up CT results   Shoulder Pain    Right-Started hurting after CT   Arm Pain    Right    Subjective:   HPI: [name] is a 79 y.o. female presenting on 08/04/2024 for Results (Follow up CT results), Shoulder Pain (Right-Started hurting after CT), and Arm Pain (Right)   Seen at [facility] on January 13 for hemoptysis by Mliss Spray, FNP Increased Advair  to twice daily for 2 weeks Of note on Xarelto  for history of atrial fibrillation.  Patient has history of DVT following surgery in November 2025 and pulmonary embolism 2004  CT angio chest performed: No evidence of pulmonary embolus but an ill-defined right upper lobe opacity was noted most consistent with pneumonia.  Recommend follow-up unenhanced chest CT to ensure resolution in 3-4 weeks.  Treat with levaquin .   Resolved cough and congestion.  No further bloody mucus. She does have baseline cough in morning.No fever.  No SOb, no wheeze.   Ever since CT angio.. he had bruising in right arm... pain in right shoulder, decreased ROm, cannot lif anything.  Arms were above head for CT scan.    Relevant past medical, surgical, family and social history reviewed and updated as indicated. Interim medical history since our last visit reviewed. Allergies and medications reviewed and updated. Outpatient Medications Prior to Visit  Medication Sig Dispense Refill   acetaminophen -codeine  (TYLENOL  #3) 300-30 MG tablet Take 1 tablet by mouth 2 (two) times daily as needed for moderate pain (pain score 4-6). 30 tablet 0   ADVAIR  HFA 230-21 MCG/ACT inhaler Inhale 2 puffs into the lungs 2 (two) times daily. On empty lungs  36 g 1   albuterol  (PROVENTIL ) (2.5 MG/3ML) 0.083% nebulizer solution Can use one vial in nebulizer every six hours as needed for cough, wheeze, shortness of breath. 180 mL 1   albuterol  (VENTOLIN  HFA) 108 (90 Base) MCG/ACT inhaler Inhale 2 puffs into the lungs every 6 (six) hours as needed for wheezing or shortness of breath (Take with Flovent  During Flare Ups.). 18 g 1   anastrozole  (ARIMIDEX ) 1 MG tablet Take 1 tablet (1 mg total) by mouth daily. 30 tablet 3   atorvastatin  (LIPITOR) 10 MG tablet TAKE 1 TABLET(10 MG) BY MOUTH DAILY 90 tablet 3   desloratadine  (CLARINEX ) 5 MG tablet TAKE 1 TABLET(5 MG) BY MOUTH DAILY 90 tablet 1   dextromethorphan-guaiFENesin  (MUCINEX  DM) 30-600 MG 12hr tablet Take 1 tablet by mouth daily.     Empagliflozin -linaGLIPtin  (GLYXAMBI ) 10-5 MG TABS TAKE 1 TABLET BY MOUTH DAILY 30 tablet 0   EPINEPHrine  0.3 mg/0.3 mL IJ SOAJ injection Inject 0.3 mg into the muscle as needed for anaphylaxis. USE AS DIRECTED FOR LIFE THREATENING ALLERGIC REACTIONS 2 each 1   famotidine  (PEPCID ) 40 MG tablet Take 1 tablet (40 mg total) by mouth at bedtime. Take 1 (ONE) tablet by mouth every evening. 90 tablet 1   fluconazole  (DIFLUCAN ) 150 MG tablet Take 1 tablet (150 mg total) by mouth every 3 (three) days as needed (  for vaginal itching/yeast infection sx). 2 tablet 0   fluticasone  (FLOVENT  HFA) 220 MCG/ACT inhaler Inhale 2 puffs into the lungs every 6 (six) hours as needed. Use with Albuterol  during flare ups. Rinse, gargle, and spit after use. 12 g 1   gabapentin  (NEURONTIN ) 300 MG capsule TAKE 1 CAPSULE BY MOUTH IN THE MORNING, 1 CAPSULE AT LUNCH AND 2 CAPSULES AT BEDTIME 360 capsule 3   glucose blood test strip Use to check blood sugar up to 2 times a day 100 each 12   hydrOXYzine  (ATARAX ) 10 MG tablet Take 1 tablet (10 mg total) by mouth daily as needed for itching. 30 tablet 2   ipratropium (ATROVENT ) 0.06 % nasal spray Can use 1-2 sprays each nostril 1-2 times per day if needed. 45 mL  1   irbesartan  (AVAPRO ) 150 MG tablet TAKE 1 TABLET(150 MG) BY MOUTH DAILY 90 tablet 3   methimazole  (TAPAZOLE ) 5 MG tablet Take 0.5 tablets (2.5 mg total) by mouth daily. 45 tablet 3   montelukast  (SINGULAIR ) 10 MG tablet Take 1 tablet (10 mg total) by mouth at bedtime. For asthma control. 90 tablet 1   nystatin  cream (MYCOSTATIN ) Apply 1 Application topically 2 (two) times daily. 30 g 2   Olopatadine HCl (PATADAY OP) Place 1 drop into both eyes 2 (two) times daily as needed (eye irritation).     pantoprazole  (PROTONIX ) 40 MG tablet Take 1 tablet (40 mg total) by mouth 2 (two) times daily. 180 tablet 1   Polyethyl Glycol-Propyl Glycol (SYSTANE OP) Place 1 drop into both eyes daily as needed (dry eyes).     rivaroxaban  (XARELTO ) 20 MG TABS tablet Take 1 tablet (20 mg total) by mouth daily with supper. 90 tablet 3   senna-docusate (SENOKOT-S) 8.6-50 MG tablet Take 2 tablets by mouth at bedtime as needed for moderate constipation. 60 tablet 0   Vibegron  (GEMTESA ) 75 MG TABS 1 tablet daily 30 tablet 0   levofloxacin  (LEVAQUIN ) 750 MG tablet Take 1 tablet (750 mg total) by mouth daily. 5 tablet 0   polyethylene glycol powder (GLYCOLAX /MIRALAX ) 17 GM/SCOOP powder Take 17 g by mouth daily as needed (no BM in 24 hours). (Patient not taking: Reported on 07/28/2024)     Facility-Administered Medications Prior to Visit  Medication Dose Route Frequency Provider Last Rate Last Admin   tezepelumab -ekko (TEZSPIRE ) 210 MG/1. syringe 210 mg  210 mg Subcutaneous Q28 days Maurilio Camellia PARAS, MD   210 mg at 08/04/24 1129     Per HPI unless specifically indicated in ROS section below Review of Systems Objective:  BP 110/60   Pulse 88   Temp 99.5 F (37.5 C) (Temporal)   Ht 5' 3 (1.6 m)   Wt 204 lb (92.5 kg)   SpO2 100%   BMI 36.14 kg/m   Wt Readings from Last 3 Encounters:  08/04/24 204 lb (92.5 kg)  07/28/24 203 lb (92.1 kg)  06/29/24 208 lb 6.4 oz (94.5 kg)      Physical Exam    Results for  orders placed or performed during the hospital encounter of 07/28/24  I-STAT creatinine   Collection Time: 07/28/24  3:52 PM  Result Value Ref Range   Creatinine, Ser 1.00 0.44 - 1.00 mg/dL   *Note: Due to a large number of results and/or encounters for the requested time period, some results have not been displayed. A complete set of results can be found in Results Review.    Assessment and Plan  There are no  diagnoses linked to this encounter.  No follow-ups on file.   Greig Ring, MD  "

## 2024-08-04 NOTE — Telephone Encounter (Signed)
 [name] came in the office to let you know that went to have the chest X ray like you wanted her to, and that she was diagnosed with pneumonia.

## 2024-08-04 NOTE — Assessment & Plan Note (Signed)
 No evidence of pulmonary embolus but an ill-defined right upper lobe opacity was noted most consistent with pneumonia.  Recommend follow-up unenhanced chest CT to ensure resolution in 3-4 weeks.

## 2024-08-05 ENCOUNTER — Ambulatory Visit: Admitting: Adult Health

## 2024-08-05 ENCOUNTER — Telehealth: Payer: Self-pay | Admitting: Adult Health

## 2024-08-05 ENCOUNTER — Encounter: Payer: Self-pay | Admitting: Adult Health

## 2024-08-05 VITALS — BP 112/75 | HR 95 | Ht 63.0 in | Wt 203.0 lb

## 2024-08-05 DIAGNOSIS — G4733 Obstructive sleep apnea (adult) (pediatric): Secondary | ICD-10-CM

## 2024-08-05 DIAGNOSIS — M25511 Pain in right shoulder: Secondary | ICD-10-CM | POA: Insufficient documentation

## 2024-08-05 NOTE — Assessment & Plan Note (Signed)
"   Resolved symptoms.. no further hemoptysis "

## 2024-08-05 NOTE — Progress Notes (Signed)
 "   PATIENT: [name] DOB: [birth year]  REASON FOR VISIT: follow up HISTORY FROM: patient  Chief Complaint  Patient presents with   Follow-up    Patient in 18, alone. Patient is here for cpap follow up, patient did not bring cpap with her at this appointment. Is willing to bring machine back, patient hasn't been using her machine. During the summer was last time machine was used.  ESS is 2    HISTORY OF PRESENT ILLNESS: Today 08/05/24:  [name] is a 79 y.o. female with a history of obstructive sleep apnea on CPAP. Returns today for follow-up.  Patient reports that she has not been using her CPAP since December.  She did have a home sleep test last year that showed mild sleep apnea she was advised by Dr. Chalice that she could discontinue CPAP usage since she was struggling to use it.  However the patient to call back in saying that she felt like she slept better with that however now she is not using it.  She returns today for an evaluation.     11/05/23: [name] is a 79 y.o. female with a history of OSA on CPAP. Returns today for follow-up. Stopped using it because it was stating it had a leak. She went to her DME company but she reports that they just told her to go home and use it. She has the dream wear mask.   Saw foot doctor yesterday and woke up this morning with pain. Was able to get an appointment to be seen tomorrow. I did advise that if the pain worsens or she develops any new symptoms she could go to urgent care.     03/12/23: [name] is a 79 y.o. female with a history of OSA on CPAP. Returns today for follow-up.  She reports that she finds the mask uncomfortable.  Therefore she has not been using it like she should.  She is willing to continue with CPAP if she finds a comfortable mask.  Her download is below     03/14/22: [name] is a 79 year old female with a history of obstructive sleep apnea on CPAP.  She returns today for follow-up.   She reports that there are some nights that she does not use the machine due to frequent trips to the bathroom.  Otherwise she does notice the benefit when she uses the machine.  She was recently diagnosed with breast cancer and will have surgery later this month.  Her download is below    12/22/20: [name] is a 79 year old female with a history of obstructive sleep apnea on CPAP.  She reports that the CPAP is working well for her.  She denies any new issues.  She joins me today for follow-up.    04/13/20: [name] is a 79 year old female with a history of obstructive sleep apnea on CPAP.  Her download indicates that she used the machine 22 out of 30 days for compliance of 73%.  She used her machine greater than 4 hours 17 days for compliance of 57%.  On average she uses her machine 5 hours and 6 minutes.  Her residual AHI is 1.3 on 5 to 10 cm of water with EPR of 3.  Her leak in the 95th percentile is 31.6 L/min. She returns today for follow-up.  HISTORY 03/05/19:   [name] is a 79 year old female with a history of obstructive sleep apnea on CPAP.  Her download indicates that she  used the machine 22 out of 30 days for compliance of 73%.  She used her machine greater than 4 hours 21 days for compliance of 70%.  On average she uses her machine 6 hours and 5 minutes.  Her residual AHI is 1.6 on 5 to 10 cm of water with EPR of 3.  Her leak in the 95th percentile is 29.7 L/min.  REVIEW OF SYSTEMS: Out of a complete 14 system review of symptoms, the patient complains only of the following symptoms, and all other reviewed systems are negative.  ESS 3  ALLERGIES: Allergies  Allergen Reactions   Dilantin [Phenytoin] Swelling    Facial swelling   Latex Hives   Penicillins Nausea And Vomiting and Swelling   Roxicodone  [Oxycodone ] Nausea And Vomiting and Other (See Comments)    Abdominal pain   Codeine  Nausea Only    Tolerates Tylenol  with codeine  combination   Hydrocodone  Nausea And Vomiting    Lyrica [Pregabalin] Itching and Other (See Comments)    Headaches Vision changes   Nickel Hives and Rash    HOME MEDICATIONS: Outpatient Medications Prior to Visit  Medication Sig Dispense Refill   acetaminophen -codeine  (TYLENOL  #3) 300-30 MG tablet Take 1 tablet by mouth 2 (two) times daily as needed for moderate pain (pain score 4-6). 30 tablet 0   ADVAIR  HFA 230-21 MCG/ACT inhaler Inhale 2 puffs into the lungs 2 (two) times daily. On empty lungs 36 g 1   albuterol  (PROVENTIL ) (2.5 MG/3ML) 0.083% nebulizer solution Can use one vial in nebulizer every six hours as needed for cough, wheeze, shortness of breath. 180 mL 1   albuterol  (VENTOLIN  HFA) 108 (90 Base) MCG/ACT inhaler Inhale 2 puffs into the lungs every 6 (six) hours as needed for wheezing or shortness of breath (Take with Flovent  During Flare Ups.). 18 g 1   ALPRAZolam  (XANAX ) 0.25 MG tablet Take 1-2 tablets (0.25-0.5 mg total) by mouth daily as needed for anxiety (prior to proceedure). 2 tablet 0   anastrozole  (ARIMIDEX ) 1 MG tablet Take 1 tablet (1 mg total) by mouth daily. 30 tablet 3   atorvastatin  (LIPITOR) 10 MG tablet TAKE 1 TABLET(10 MG) BY MOUTH DAILY 90 tablet 3   desloratadine  (CLARINEX ) 5 MG tablet TAKE 1 TABLET(5 MG) BY MOUTH DAILY 90 tablet 1   dextromethorphan-guaiFENesin  (MUCINEX  DM) 30-600 MG 12hr tablet Take 1 tablet by mouth daily.     Empagliflozin -linaGLIPtin  (GLYXAMBI ) 10-5 MG TABS TAKE 1 TABLET BY MOUTH DAILY 30 tablet 0   EPINEPHrine  0.3 mg/0.3 mL IJ SOAJ injection Inject 0.3 mg into the muscle as needed for anaphylaxis. USE AS DIRECTED FOR LIFE THREATENING ALLERGIC REACTIONS 2 each 1   famotidine  (PEPCID ) 40 MG tablet Take 1 tablet (40 mg total) by mouth at bedtime. Take 1 (ONE) tablet by mouth every evening. 90 tablet 1   fluconazole  (DIFLUCAN ) 150 MG tablet Take 1 tablet (150 mg total) by mouth every 3 (three) days as needed (for vaginal itching/yeast infection sx). 2 tablet 0   fluticasone  (FLOVENT  HFA)  220 MCG/ACT inhaler Inhale 2 puffs into the lungs every 6 (six) hours as needed. Use with Albuterol  during flare ups. Rinse, gargle, and spit after use. 12 g 1   gabapentin  (NEURONTIN ) 300 MG capsule TAKE 1 CAPSULE BY MOUTH IN THE MORNING, 1 CAPSULE AT LUNCH AND 2 CAPSULES AT BEDTIME 360 capsule 3   glucose blood test strip Use to check blood sugar up to 2 times a day 100 each 12   hydrOXYzine  (ATARAX )  10 MG tablet Take 1 tablet (10 mg total) by mouth daily as needed for itching. 30 tablet 2   ipratropium (ATROVENT ) 0.06 % nasal spray Can use 1-2 sprays each nostril 1-2 times per day if needed. 45 mL 1   irbesartan  (AVAPRO ) 150 MG tablet TAKE 1 TABLET(150 MG) BY MOUTH DAILY 90 tablet 3   methimazole  (TAPAZOLE ) 5 MG tablet Take 0.5 tablets (2.5 mg total) by mouth daily. 45 tablet 3   montelukast  (SINGULAIR ) 10 MG tablet Take 1 tablet (10 mg total) by mouth at bedtime. For asthma control. 90 tablet 1   nystatin  cream (MYCOSTATIN ) Apply 1 Application topically 2 (two) times daily. 30 g 2   Olopatadine HCl (PATADAY OP) Place 1 drop into both eyes 2 (two) times daily as needed (eye irritation).     pantoprazole  (PROTONIX ) 40 MG tablet Take 1 tablet (40 mg total) by mouth 2 (two) times daily. 180 tablet 1   Polyethyl Glycol-Propyl Glycol (SYSTANE OP) Place 1 drop into both eyes daily as needed (dry eyes).     predniSONE  (DELTASONE ) 20 MG tablet 3 tabs by mouth daily x 3 days, then 2 tabs by mouth daily x 2 days then 1 tab by mouth daily x 2 days 15 tablet 0   rivaroxaban  (XARELTO ) 20 MG TABS tablet Take 1 tablet (20 mg total) by mouth daily with supper. 90 tablet 3   senna-docusate (SENOKOT-S) 8.6-50 MG tablet Take 2 tablets by mouth at bedtime as needed for moderate constipation. 60 tablet 0   Vibegron  (GEMTESA ) 75 MG TABS 1 tablet daily 30 tablet 0   Facility-Administered Medications Prior to Visit  Medication Dose Route Frequency Provider Last Rate Last Admin   tezepelumab -ekko (TEZSPIRE ) 210  MG/1. syringe 210 mg  210 mg Subcutaneous Q28 days Maurilio Camellia PARAS, MD   210 mg at 08/04/24 1129    PAST MEDICAL HISTORY: Past Medical History:  Diagnosis Date   Allergic rhinitis    Allergy    Arthritis    Asthma    Breast cancer (HCC)    Chronic headache    Diabetes mellitus    Ductal carcinoma in situ (DCIS) of left breast 02/23/2022   DVT (deep venous thrombosis) (HCC)    Dyspnea    Heart murmur    History of radiation therapy    Left Breast 05/02/22-05/30/22- Dr. Lynwood Nasuti   Hypertension    Penicillin allergy 01/19/2020   Pulmonary embolism (HCC)    Pulmonary hypertension (HCC)    Seizures (HCC)     PAST SURGICAL HISTORY: Past Surgical History:  Procedure Laterality Date   ABDOMINAL HYSTERECTOMY     partial, has ovaries   AMPUTATION TOE Right 04/22/2024   Procedure: AMPUTATION, TOE;  Surgeon: Malvin Marsa FALCON, DPM;  Location: [facility] OR;  Service: Orthopedics/Podiatry;  Laterality: Right;  RIGHT 3RD TOE AMPUTATION   BREAST LUMPECTOMY WITH RADIOACTIVE SEED LOCALIZATION Left 03/29/2022   Procedure: LEFT BREAST LUMPECTOMY WITH RADIOACTIVE SEED LOCALIZATION;  Surgeon: Vanderbilt Ned, MD;  Location: [facility] OR;  Service: General;  Laterality: Left;   CARDIAC CATHETERIZATION  12/28/2010   Mod. pulmonary hypertension, normal coronary arteries   CARDIOVERSION N/A 11/23/2020   Procedure: CARDIOVERSION;  Surgeon: Francyne Headland, MD;  Location: [facility];  Service: Cardiovascular;  Laterality: N/A;   CHOLECYSTECTOMY N/A 12/20/2021   Procedure: LAPAROSCOPIC CHOLECYSTECTOMY WITH INTRAOPERATIVE CHOLANGIOGRAM;  Surgeon: Eletha Boas, MD;  Location: [facility] ORS;  Service: General;  Laterality: N/A;   DOPPLER ECHOCARDIOGRAPHY  10/08/2011   EF=>55%,mild asymmetric LVH,  mod. TR, mod. PH, mild to mod LA dilatation   ESOPHAGEAL MANOMETRY N/A 06/19/2023   Procedure: ESOPHAGEAL MANOMETRY (EM);  Surgeon: Saintclair Jasper, MD;  Location: [facility];  Service: Gastroenterology;  Laterality: N/A;   IR  LUMBAR DISC ASPIRATION W/IMG GUIDE  01/12/2020   KNEE ARTHROSCOPY Left    KNEE SURGERY     LOWER EXTREMITY ANGIOGRAPHY N/A 04/20/2024   Procedure: Lower Extremity Angiography;  Surgeon: Pearline Norman RAMAN, MD;  Location: [facility] INVASIVE CV LAB;  Service: Cardiovascular;  Laterality: N/A;   Nuclear Stress Test  05/20/2006   No ischemia   PARTIAL HYSTERECTOMY     PLANTAR FASCIA SURGERY     TONSILLECTOMY      FAMILY HISTORY: Family History  Problem Relation Age of Onset   Cancer Mother        breast and colon cancer   Hypertension Mother    Clotting disorder Mother    Breast cancer Mother 93   Arthritis Mother    Stroke Mother    Diabetes Mother    Colon cancer Mother 52 - 66   Alcohol  abuse Father    Cancer Sister        breast cancer   Multiple sclerosis Sister    Cancer Sister        colon cancer   Cancer Brother        colon cancer   Prostate cancer Half-Brother    Stomach cancer Half-Brother    Breast cancer Half-Sister 52       recurrence at 20, reports positive genetic testing   Arthritis Half-Sister    Diabetes Half-Sister    Colon cancer Half-Sister 62   Allergies Other        grandson   Allergic rhinitis Neg Hx    Angioedema Neg Hx    Asthma Neg Hx    Atopy Neg Hx    Eczema Neg Hx    Immunodeficiency Neg Hx    Urticaria Neg Hx     SOCIAL HISTORY: Social History   Socioeconomic History   Marital status: Widowed    Spouse name: Not on file   Number of children: Y   Years of education: Not on file   Highest education level: Not on file  Occupational History   Occupation: retired programme researcher, broadcasting/film/video.   Tobacco Use   Smoking status: Never    Passive exposure: Never   Smokeless tobacco: Never  Vaping Use   Vaping status: Never Used  Substance and Sexual Activity   Alcohol  use: Not Currently    Alcohol /week: 0.0 standard drinks of alcohol     Comment: occ glass on wine   Drug use: No   Sexual activity: Never  Other Topics Concern   Not on file  Social  History Narrative   Widow limited exercise.   Patient is retired, patient lives alone .   Social Drivers of Health   Tobacco Use: Low Risk (08/05/2024)   Patient History    Smoking Tobacco Use: Never    Smokeless Tobacco Use: Never    Passive Exposure: Never  Financial Resource Strain: Low Risk (05/16/2023)   Overall Financial Resource Strain (CARDIA)    Difficulty of Paying Living Expenses: Not hard at all  Food Insecurity: No Food Insecurity (05/19/2024)   Epic    Worried About Radiation Protection Practitioner of Food in the Last Year: Never true    Ran Out of Food in the Last Year: Never true  Transportation Needs: No Transportation Needs (05/19/2024)  Epic    Lack of Transportation (Medical): No    Lack of Transportation (Non-Medical): No  Physical Activity: Inactive (05/19/2024)   Exercise Vital Sign    Days of Exercise per Week: 0 days    Minutes of Exercise per Session: 0 min  Stress: No Stress Concern Present (05/19/2024)   [employer] of Occupational Health - Occupational Stress Questionnaire    Feeling of Stress: Only a little  Social Connections: Moderately Isolated (05/19/2024)   Social Connection and Isolation Panel    Frequency of Communication with Friends and Family: More than three times a week    Frequency of Social Gatherings with Friends and Family: Once a week    Attends Religious Services: More than 4 times per year    Active Member of [employer] or Organizations: No    Attends Banker Meetings: Never    Marital Status: Widowed  Intimate Partner Violence: Not At Risk (05/19/2024)   Epic    Fear of Current or Ex-Partner: No    Emotionally Abused: No    Physically Abused: No    Sexually Abused: No  Depression (PHQ2-9): Low Risk (08/04/2024)   Depression (PHQ2-9)    PHQ-2 Score: 1  Alcohol  Screen: Low Risk (05/16/2023)   Alcohol  Screen    Last Alcohol  Screening Score (AUDIT): 0  Housing: Low Risk (05/19/2024)   Epic    Unable to Pay for Housing in the Last Year:  No    Number of Times Moved in the Last Year: 0    Homeless in the Last Year: No  Utilities: Not At Risk (05/19/2024)   Epic    Threatened with loss of utilities: No  Health Literacy: Adequate Health Literacy (05/19/2024)   B1300 Health Literacy    Frequency of need for help with medical instructions: Never      PHYSICAL EXAM  Vitals:   08/05/24 1127  BP: 112/75  Pulse: 95  Weight: 203 lb (92.1 kg)  Height: 5' 3 (1.6 m)     Body mass index is 35.96 kg/m.  Generalized: Well developed, in no acute distress  Chest: Lungs clear to auscultation bilaterally  Neurological examination  Mentation: Alert oriented to time, place, history taking. Follows all commands speech and language fluent Cranial nerve II-XII: Facial symmetry noted   DIAGNOSTIC DATA (LABS, IMAGING, TESTING) - I reviewed patient records, labs, notes, testing and imaging myself where available.  Lab Results  Component Value Date   WBC 7.8 04/27/2024   HGB 11.8 (L) 04/27/2024   HCT 34.4 (L) 04/27/2024   MCV 79.8 (L) 04/27/2024   PLT 155 04/27/2024      Component Value Date/Time   NA 141 04/27/2024 1301   NA 140 11/21/2020 1026   K 3.7 04/27/2024 1301   CL 103 04/27/2024 1301   CO2 27 04/27/2024 1301   GLUCOSE 103 (H) 04/27/2024 1301   BUN 6 (L) 04/27/2024 1301   BUN 16 11/21/2020 1026   CREATININE 1.00 07/28/2024 1552   CREATININE 1.03 (H) 02/28/2022 0824   CREATININE 1.01 (H) 12/30/2020 1635   CALCIUM  9.4 04/27/2024 1301   PROT 5.8 (L) 04/18/2024 0924   ALBUMIN  3.1 (L) 04/18/2024 0924   AST 21 04/18/2024 0924   AST 16 02/28/2022 0824   ALT 10 04/18/2024 0924   ALT 10 02/28/2022 0824   ALKPHOS 80 04/18/2024 0924   BILITOT 0.6 04/18/2024 0924   BILITOT 0.8 02/28/2022 0824   GFRNONAA >60 04/27/2024 1301   GFRNONAA 57 (L) 02/28/2022  9175   GFRNONAA 53 (L) 01/04/2020 1059   GFRAA 62 01/04/2020 1059   Lab Results  Component Value Date   CHOL 94 04/21/2024   HDL 53 04/21/2024   LDLCALC 33  04/21/2024   TRIG 39 04/21/2024   CHOLHDL 1.8 04/21/2024   Lab Results  Component Value Date   HGBA1C 5.6 04/17/2024   Lab Results  Component Value Date   VITAMINB12 >1500 (H) 01/31/2024   Lab Results  Component Value Date   TSH 3.50 05/04/2024      ASSESSMENT AND PLAN 79 y.o. year old female  has a past medical history of Allergic rhinitis, Allergy, Arthritis, Asthma, Breast cancer (HCC), Chronic headache, Diabetes mellitus, Ductal carcinoma in situ (DCIS) of left breast (02/23/2022), DVT (deep venous thrombosis) (HCC), Dyspnea, Heart murmur, History of radiation therapy, Hypertension, Penicillin allergy (01/19/2020), Pulmonary embolism (HCC), Pulmonary hypertension (HCC), and Seizures (HCC). here with:  OSA on CPAP  - noncompliant with CPAP  - Patient states that she would like to try using the CPAP.  Advised if she can be compliant for 30 days and she may qualify for new machine pending insurance approval - Patient will call back and schedule appointment when she has consistently used her CPAP machine.    Duwaine Russell, MSN, NP-C 08/05/2024, 11:42 AM [facility] [address] [place], [place] [address] [phone]  "

## 2024-08-05 NOTE — Assessment & Plan Note (Signed)
"   Acute, most likely injury to rotator cuff with positioning during CT scan.  Some concern given right upper lobe issue seen on CT.  Will follow-up with CT scan of lung to reassess. Encourage patient to start heat and gentle range of motion exercises.  Information for home PT provided. NSAIDs contraindicated. Will treat with prednisone  taper.  If not improving as expected will refer to Ortho ordered sports medicine for possible steroid injection. "

## 2024-08-05 NOTE — Telephone Encounter (Signed)
 Patient called to verify appointment

## 2024-08-13 ENCOUNTER — Telehealth: Payer: Self-pay

## 2024-08-13 ENCOUNTER — Inpatient Hospital Stay: Payer: Medicare Other | Attending: Hematology and Oncology | Admitting: Hematology and Oncology

## 2024-08-13 DIAGNOSIS — D0512 Intraductal carcinoma in situ of left breast: Secondary | ICD-10-CM | POA: Diagnosis not present

## 2024-08-13 NOTE — Progress Notes (Signed)
 "  Patient Care Team: Avelina Greig BRAVO, MD as PCP - General (Family Medicine) Croitoru, Jerel, MD as PCP - Cardiology (Cardiology) Maurilio, Camellia PARAS, MD as Consulting Physician (Allergy and Immunology) Loetta Nottingham, MD as Consulting Physician (Endocrinology) Christine Rush, DPM as Consulting Physician (Podiatry) Ivin Kocher, MD as Consulting Physician (Dermatology) Euna Read, MD as Referring Physician (Specialist) Kin, Olivia, DC as Referring Physician (Chiropractic Medicine) Donnald Charleston, MD as Consulting Physician (Gastroenterology) Hallows, Dene POUR, MD (Orthopedic Surgery) Dohmeier, Dedra, MD as Consulting Physician (Neurology) Vanderbilt Ned, MD as Consulting Physician (General Surgery) Shannon Agent, MD as Consulting Physician (Radiation Oncology) Fate Morna SAILOR, [facility] (Inactive) as Pharmacist (Pharmacist) Ermalinda Lenn HERO, [place] as Social Worker Odean Potts, MD as Consulting Physician (Hematology and Oncology) Gaston Hamilton, MD as Consulting Physician (Urology) Cleatus Collar, MD as Consulting Physician (Ophthalmology) Dentistry, [facility]  DIAGNOSIS:  Encounter Diagnosis  Name Primary?   Ductal carcinoma in situ (DCIS) of left breast Yes    SUMMARY OF ONCOLOGIC HISTORY: Oncology History  Ductal carcinoma in situ (DCIS) of left breast  02/19/2022 Initial Diagnosis   Screening mammogram detected left breast asymmetry 1.1 cm oval mass on biopsy revealed intermediate grade DCIS ER 80% weak, PR negative    Genetic Testing   [facility] CustomNext Panel was Negative. Report date is 03/13/2022.  The CustomNext-Cancer+RNAinsight panel offered by [facility] includes sequencing and rearrangement analysis for the following 47 genes:  APC, ATM, AXIN2, BARD1, BMPR1A, BRCA1, BRCA2, BRIP1, CDH1, CDK4, CDKN2A, CHEK2, CTNNA1, DICER1, EPCAM, GREM1, HOXB13, KIT, MEN1, MLH1, MSH2, MSH3, MSH6, MUTYH, NBN, NF1, NTHL1, PALB2, PDGFRA, PMS2, POLD1, POLE, PTEN, RAD50,  RAD51C, RAD51D, SDHA, SDHB, SDHC, SDHD, SMAD4, SMARCA4, STK11, TP53, TSC1, TSC2, and VHL.  RNA data is routinely analyzed for use in variant interpretation for all genes.   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  She continues anastrozole  without new toxicity. Her chronic leg cramps are unchanged. Her December 2025 mammogram showed breast density B.  In January 2026 she had right-sided pneumonia with increased throat mucus, one episode of mucus mixed with blood, and severe right shoulder and arm pain radiating to the fingers. She completed a 5-day course of antibiotics and prednisone  with full resolution of respiratory symptoms and systemic complaints, and she currently feels well.  A 3.9 cm thyroid  nodule was incidentally noted on recent CT. She was not previously aware of this and has not yet discussed it with her providers. She is followed for thyroid  management by her diabetes provider.     ALLERGIES:  is allergic to dilantin [phenytoin], latex, penicillins, roxicodone  [oxycodone ], codeine , hydrocodone , lyrica [pregabalin], and nickel.  MEDICATIONS:  Current Outpatient Medications  Medication Sig Dispense Refill   acetaminophen -codeine  (TYLENOL  #3) 300-30 MG tablet Take 1 tablet by mouth 2 (two) times daily as needed for  moderate pain (pain score 4-6). 30 tablet 0   ADVAIR  HFA 230-21 MCG/ACT inhaler Inhale 2 puffs into the lungs 2 (two) times daily. On empty lungs 36 g 1   albuterol  (PROVENTIL ) (2.5 MG/3ML) 0.083% nebulizer solution Can use one vial in nebulizer every six hours as needed for cough, wheeze, shortness of breath. 180 mL 1   albuterol  (VENTOLIN  HFA) 108 (90 Base) MCG/ACT inhaler Inhale 2 puffs into the lungs every 6 (six) hours as needed for wheezing or shortness of breath (Take with Flovent  During Flare Ups.). 18 g 1   ALPRAZolam  (XANAX ) 0.25 MG tablet Take 1-2 tablets (0.25-0.5 mg total) by mouth daily as needed for anxiety (prior to proceedure). 2 tablet 0   anastrozole  (ARIMIDEX ) 1 MG tablet Take 1 tablet (1 mg total) by mouth daily. 30 tablet 3   atorvastatin  (LIPITOR) 10 MG tablet TAKE 1 TABLET(10 MG) BY MOUTH DAILY 90 tablet 3   desloratadine  (CLARINEX ) 5 MG tablet TAKE 1 TABLET(5 MG) BY MOUTH DAILY 90 tablet 1   dextromethorphan-guaiFENesin  (MUCINEX  DM) 30-600 MG 12hr tablet Take 1 tablet by mouth daily.     Empagliflozin -linaGLIPtin  (GLYXAMBI ) 10-5 MG TABS TAKE 1 TABLET BY MOUTH DAILY 30 tablet 0   EPINEPHrine  0.3 mg/0.3 mL IJ SOAJ injection Inject 0.3 mg into the muscle as needed for anaphylaxis. USE AS DIRECTED FOR LIFE THREATENING ALLERGIC REACTIONS 2 each 1   famotidine  (PEPCID ) 40 MG tablet Take 1 tablet (40 mg total) by mouth at bedtime. Take 1 (ONE) tablet by mouth every evening. 90 tablet 1   fluconazole  (DIFLUCAN ) 150 MG tablet Take 1 tablet (150 mg total) by mouth every 3 (three) days as needed (for vaginal itching/yeast infection sx). 2 tablet 0   fluticasone  (FLOVENT  HFA) 220 MCG/ACT inhaler Inhale 2 puffs into the lungs every 6 (six) hours as needed. Use with Albuterol  during flare ups. Rinse, gargle, and spit after use. 12 g 1   gabapentin  (NEURONTIN ) 300 MG capsule TAKE 1 CAPSULE BY MOUTH IN THE MORNING, 1 CAPSULE AT LUNCH AND 2 CAPSULES AT BEDTIME 360 capsule 3   glucose blood  test strip Use to check blood sugar up to 2 times a day 100 each 12   hydrOXYzine  (ATARAX ) 10 MG tablet Take 1 tablet (10 mg total) by mouth daily as needed for itching. 30 tablet 2   ipratropium (ATROVENT ) 0.06 % nasal spray Can use 1-2 sprays each nostril 1-2 times per day if needed. 45 mL 1   irbesartan  (AVAPRO ) 150 MG tablet TAKE 1 TABLET(150 MG) BY MOUTH DAILY 90 tablet 3   methimazole  (TAPAZOLE ) 5 MG tablet Take 0.5 tablets (2.5 mg total) by mouth daily. 45 tablet 3   montelukast  (SINGULAIR ) 10 MG tablet Take 1 tablet (10 mg total) by mouth at bedtime. For asthma control. 90 tablet 1   nystatin  cream (MYCOSTATIN ) Apply 1 Application topically 2 (two) times daily. 30 g 2   Olopatadine HCl (PATADAY OP) Place 1 drop into both eyes 2 (two) times daily as needed (eye irritation).     pantoprazole  (PROTONIX ) 40 MG tablet Take 1 tablet (40 mg total) by mouth 2 (two) times daily. 180 tablet 1   Polyethyl Glycol-Propyl Glycol (SYSTANE OP) Place 1 drop into both eyes daily as needed (dry eyes).     predniSONE  (DELTASONE ) 20 MG tablet 3 tabs by mouth daily x 3 days, then 2 tabs by mouth daily x 2 days then 1 tab by mouth daily x 2 days 15 tablet 0  rivaroxaban  (XARELTO ) 20 MG TABS tablet Take 1 tablet (20 mg total) by mouth daily with supper. 90 tablet 3   senna-docusate (SENOKOT-S) 8.6-50 MG tablet Take 2 tablets by mouth at bedtime as needed for moderate constipation. 60 tablet 0   Vibegron  (GEMTESA ) 75 MG TABS 1 tablet daily 30 tablet 0   Current Facility-Administered Medications  Medication Dose Route Frequency Provider Last Rate Last Admin   tezepelumab -ekko (TEZSPIRE ) 210 MG/1. syringe 210 mg  210 mg Subcutaneous Q28 days Maurilio Camellia PARAS, MD   210 mg at 08/04/24 1129    PHYSICAL EXAMINATION: ECOG PERFORMANCE STATUS: 1 - Symptomatic but completely ambulatory   LABORATORY DATA:  I have reviewed the data as listed    Latest Ref Rng & Units 07/28/2024    3:52 PM 04/27/2024    1:01 PM  04/23/2024    3:22 AM  CMP  Glucose 70 - 99 mg/dL  896  893   BUN 8 - 23 mg/dL  6  7   Creatinine 9.55 - 1.00 mg/dL 8.99  9.18  9.23   Sodium 135 - 145 mmol/L  141  139   Potassium 3.5 - 5.1 mmol/L  3.7  4.4   Chloride 98 - 111 mmol/L  103  108   CO2 22 - 32 mmol/L  27  23   Calcium  8.9 - 10.3 mg/dL  9.4  8.6     Lab Results  Component Value Date   WBC 7.8 04/27/2024   HGB 11.8 (L) 04/27/2024   HCT 34.4 (L) 04/27/2024   MCV 79.8 (L) 04/27/2024   PLT 155 04/27/2024   NEUTROABS 3.8 04/17/2024    ASSESSMENT & PLAN:  Ductal carcinoma in situ (DCIS) of left breast 02/19/2022: Screening mammogram detected left breast asymmetry 1.1 cm oval mass on biopsy revealed intermediate grade DCIS ER 80% weak, PR negative 03/29/2022: Left lumpectomy: Intermediate grade DCIS, margins negative, 1 cm size, ER 80% weak, PR 0% 05/30/2022: Completed adjuvant radiation   Current treatment: Antiestrogen therapy with anastrozole  Anastrozole  Toxicities: Tolerating it extremely well without any problems or concerns.  She does have leg cramps but these have been there even before starting anastrozole  therapy.   Breast Cancer Surveillance: Mammogram: 07/03/2024 at [facility]: Benign, breast density category B Breast Exam 08/13/2024: Benign CT angiogram chest 07/28/2024: No PE right upper lobe opacity consistent with pneumonia, 3.9 cm left thyroid  nodule   Return to clinic in 1 year for follow-up ------------------------------------- Assessment and Plan Assessment & Plan Ductal carcinoma in situ (DCIS) of left breast Intermediate grade, stage 0 DCIS of the left breast, post-lumpectomy with negative margins and adjuvant radiation therapy, in remission. Tolerating anastrozole  without significant toxicities. No evidence of recurrence on recent mammogram and clinical exam. Prognosis excellent due to negative margins, ER+ status, and completed therapy. - Continue anastrozole ; refills sufficient through end of year.  Continue unless intolerable side effects or new contraindications develop. - Monitor for musculoskeletal symptoms, bone health, and menopausal symptoms. - Annual mammogram scheduled for December 2025; clinical follow-up in January 2026. - Monitor for late radiation effects, including skin changes and fibrosis. - Survivorship plan includes monitoring for late effects of radiation, bone health, and addressing quality of life and menopausal symptoms as needed.  Right-sided pneumonia Recent right-sided pneumonia resolved with antibiotics and prednisone , with resolution of respiratory symptoms. - Follow-up CT scan scheduled for February 3 to assess resolution.  Thyroid  nodule Incidental 3.9 cm thyroid  nodule identified on CT, asymptomatic, under endocrinology care for management. - Discussed  incidental thyroid  nodule. - Advised follow-up with endocrinology for further evaluation.      No orders of the defined types were placed in this encounter.  The patient has a good understanding of the overall plan. she agrees with it. she will call with any problems that may develop before the next visit here.  I personally spent a total of 30 minutes in the care of the patient today including preparing to see the patient, getting/reviewing separately obtained history, performing a medically appropriate exam/evaluation, counseling and educating, placing orders, referring and communicating with other health care professionals, documenting clinical information in the EHR, independently interpreting results, communicating results, and coordinating care.   Dr.Hong Moring 08/13/24    "

## 2024-08-13 NOTE — Telephone Encounter (Signed)
 Patient called and asked could you look at her CT she had done this month, It showed a L thyroid  nodule and she wanted to know what does she need to do of if anything needed to be done.

## 2024-08-13 NOTE — Assessment & Plan Note (Signed)
 02/19/2022: Screening mammogram detected left breast asymmetry 1.1 cm oval mass on biopsy revealed intermediate grade DCIS ER 80% weak, PR negative 03/29/2022: Left lumpectomy: Intermediate grade DCIS, margins negative, 1 cm size, ER 80% weak, PR 0% 05/30/2022: Completed adjuvant radiation   Current treatment: Antiestrogen therapy with anastrozole  Anastrozole  Toxicities: Tolerating it extremely well without any problems or concerns.  She does have leg cramps but these have been there even before starting anastrozole  therapy.   Breast Cancer Surveillance: Mammogram: 07/03/2024 at [facility]: Benign, breast density category B Breast Exam 08/13/2024: Benign CT angiogram chest 07/28/2024: No PE right upper lobe opacity consistent with pneumonia, 3.9 cm left thyroid  nodule   Return to clinic in 1 year for follow-up

## 2024-08-14 NOTE — Telephone Encounter (Signed)
 LMTRC  J.Shonna Deiter,RMA

## 2024-08-18 ENCOUNTER — Other Ambulatory Visit: Payer: Self-pay | Admitting: *Deleted

## 2024-08-18 ENCOUNTER — Encounter: Payer: Self-pay | Admitting: Family Medicine

## 2024-08-18 ENCOUNTER — Other Ambulatory Visit

## 2024-08-18 MED ORDER — MONTELUKAST SODIUM 10 MG PO TABS: 10.0000 mg | ORAL_TABLET | Freq: Every day | ORAL | 0 refills | Status: AC

## 2024-08-20 ENCOUNTER — Ambulatory Visit: Admitting: Family Medicine

## 2024-08-20 ENCOUNTER — Encounter: Payer: Self-pay | Admitting: Family Medicine

## 2024-08-20 ENCOUNTER — Telehealth: Payer: Self-pay | Admitting: Family Medicine

## 2024-08-20 ENCOUNTER — Encounter: Payer: Self-pay | Admitting: Cardiovascular Disease

## 2024-08-20 VITALS — BP 118/60 | HR 105 | Temp 98.0°F | Ht 63.0 in | Wt 207.0 lb

## 2024-08-20 DIAGNOSIS — M25511 Pain in right shoulder: Secondary | ICD-10-CM

## 2024-08-20 DIAGNOSIS — R042 Hemoptysis: Secondary | ICD-10-CM | POA: Insufficient documentation

## 2024-08-20 NOTE — Telephone Encounter (Signed)
 Please triage

## 2024-08-20 NOTE — Assessment & Plan Note (Signed)
 Acute, recurrent. Treated for pneumonia after first episode of hemoptysis as CT scan showed right upper lobe abnormality. She has CT scan repeat scheduled next week. While hemoptysis could be due to aggressive throat clearing, dry mucous membranes on Xarelto , I have significant concern for lung mass given initial CT appearance and  the fact that the patient never had symptoms of pneumonia. Also concerning is the new right upper shoulder pain that improved with prednisone  but is recurrent.  Concerned about Pancoast tumor and resulting shoulder pain. If CT scan is clear we will move forward with ENT referral for evaluation with laryngoscopy.  Lung exam is clear and patient without shortness of breath.  ER and return precautions provided to patient.  If significant further hemoptysis she is to go to the emergency room.

## 2024-08-20 NOTE — Assessment & Plan Note (Signed)
 Acute, initial improvement with prednisone  but now pain has returned.  Patient plans to make appointment with orthopedic for further evaluation.

## 2024-08-20 NOTE — Telephone Encounter (Signed)
 Copied from CRM #[id number]. Topic: Clinical - Refused Triage >> Aug 20, 2024 10:38 AM Tiffini S wrote: Patient/caller voiced complaints of NT not transferring the call to Donna/ Dr. Avelina nurse and declined transfer to triage for  Patient cleared her throat- was spitting up blood with mucus  Please call the patient back at [phone]   If patient is unestablished, route message to [facility] Nurse Triage If patient is established, route message to the appropriate department clinical pool

## 2024-08-20 NOTE — Telephone Encounter (Signed)
Noted. Will see.

## 2024-08-20 NOTE — Telephone Encounter (Signed)
 I spoke with pt; pt said starting this morning pt has had a prod cough with clear phlegm that is blood tinged. Pt said no S/T, H/A,dizziness, CP or SOB.or fever. Pt is in no distress but pt concerned about seeing blood in mucus. Pt scheduled appt with dr Avelina 08/20/24 at 3:20 with UC & ED precautions and pt voiced understanding.sending ntoe to Dr Avelina and [facility] pool.

## 2024-08-20 NOTE — Progress Notes (Signed)
 "   Patient ID: [name], female    DOB: October 01, 1945, 79 y.o.   MRN: [id number]  This visit was conducted in person.  BP 118/60 (Cuff Size: Normal)   Pulse (!) 105   Temp 98 F (36.7 C) (Oral)   Ht 5' 3 (1.6 m)   Wt 207 lb (93.9 kg)   SpO2 98%   BMI 36.67 kg/m    CC:  Chief Complaint  Patient presents with   Acute Visit    pt has had a prod cough with clear phlegm that is blood tinged, onset 08/20/24 Has had issues in past, pt states she has been through throat therapy class  [name] pt's neighbor is in office with pt today    Subjective:   HPI: [name] is a 78 y.o. female presenting on 08/20/2024 for Acute Visit (pt has had a prod cough with clear phlegm that is blood tinged, onset 08/20/24/Has had issues in past, pt states she has been through throat therapy class//[name] pt's neighbor is in office with pt today)   New onset blood in sputum 08/20/2024 Throat felt tight.. was trying to swallow with water.  She she cleared throat.  Bright red sputum , mucus.SABRA occurred.   Still having right upper shoulder pain (  got better with prednisone  taper but came back in last... started after CT.  No change in breathing  Hx of pulmonary HTN, afib, HTN, HFpEF,    On chronic anticoagulation Xarelto .  Had similar episode 07/28/2024.. never really had cough, no fever, no change in SOB.  Chest CT showed:   IMPRESSION: 1. No definite evidence of pulmonary embolus. 2. Ill-defined right upper lobe opacity is noted most consistent with pneumonia, but follow-up unenhanced chest CT in several weeks is recommended to ensure resolution and rule out underlying neoplasm.  Treated with antibiotics for pneumonia presumed.. has schedule  CT Chest to evaluate for resolution 2/11     Relevant past medical, surgical, family and social history reviewed and updated as indicated. Interim medical history since our last visit reviewed. Allergies and medications reviewed and  updated. Outpatient Medications Prior to Visit  Medication Sig Dispense Refill   acetaminophen -codeine  (TYLENOL  #3) 300-30 MG tablet Take 1 tablet by mouth 2 (two) times daily as needed for moderate pain (pain score 4-6). 30 tablet 0   ADVAIR  HFA 230-21 MCG/ACT inhaler Inhale 2 puffs into the lungs 2 (two) times daily. On empty lungs 36 g 1   albuterol  (PROVENTIL ) (2.5 MG/3ML) 0.083% nebulizer solution Can use one vial in nebulizer every six hours as needed for cough, wheeze, shortness of breath. 180 mL 1   albuterol  (VENTOLIN  HFA) 108 (90 Base) MCG/ACT inhaler Inhale 2 puffs into the lungs every 6 (six) hours as needed for wheezing or shortness of breath (Take with Flovent  During Flare Ups.). 18 g 1   ALPRAZolam  (XANAX ) 0.25 MG tablet Take 1-2 tablets (0.25-0.5 mg total) by mouth daily as needed for anxiety (prior to proceedure). 2 tablet 0   anastrozole  (ARIMIDEX ) 1 MG tablet Take 1 tablet (1 mg total) by mouth daily. 30 tablet 3   atorvastatin  (LIPITOR) 10 MG tablet TAKE 1 TABLET(10 MG) BY MOUTH DAILY 90 tablet 3   desloratadine  (CLARINEX ) 5 MG tablet TAKE 1 TABLET(5 MG) BY MOUTH DAILY 90 tablet 1   dextromethorphan-guaiFENesin  (MUCINEX  DM) 30-600 MG 12hr tablet Take 1 tablet by mouth daily.     Empagliflozin -linaGLIPtin  (GLYXAMBI ) 10-5 MG TABS TAKE 1 TABLET BY MOUTH  DAILY 30 tablet 0   EPINEPHrine  0.3 mg/0.3 mL IJ SOAJ injection Inject 0.3 mg into the muscle as needed for anaphylaxis. USE AS DIRECTED FOR LIFE THREATENING ALLERGIC REACTIONS 2 each 1   famotidine  (PEPCID ) 40 MG tablet Take 1 tablet (40 mg total) by mouth at bedtime. Take 1 (ONE) tablet by mouth every evening. 90 tablet 1   fluconazole  (DIFLUCAN ) 150 MG tablet Take 1 tablet (150 mg total) by mouth every 3 (three) days as needed (for vaginal itching/yeast infection sx). 2 tablet 0   fluticasone  (FLOVENT  HFA) 220 MCG/ACT inhaler Inhale 2 puffs into the lungs every 6 (six) hours as needed. Use with Albuterol  during flare ups. Rinse,  gargle, and spit after use. 12 g 1   gabapentin  (NEURONTIN ) 300 MG capsule TAKE 1 CAPSULE BY MOUTH IN THE MORNING, 1 CAPSULE AT LUNCH AND 2 CAPSULES AT BEDTIME 360 capsule 3   glucose blood test strip Use to check blood sugar up to 2 times a day 100 each 12   hydrOXYzine  (ATARAX ) 10 MG tablet Take 1 tablet (10 mg total) by mouth daily as needed for itching. 30 tablet 2   ipratropium (ATROVENT ) 0.06 % nasal spray Can use 1-2 sprays each nostril 1-2 times per day if needed. 45 mL 1   irbesartan  (AVAPRO ) 150 MG tablet TAKE 1 TABLET(150 MG) BY MOUTH DAILY 90 tablet 3   methimazole  (TAPAZOLE ) 5 MG tablet Take 0.5 tablets (2.5 mg total) by mouth daily. 45 tablet 3   montelukast  (SINGULAIR ) 10 MG tablet Take 1 tablet (10 mg total) by mouth at bedtime. 90 tablet 0   nystatin  cream (MYCOSTATIN ) Apply 1 Application topically 2 (two) times daily. 30 g 2   Olopatadine HCl (PATADAY OP) Place 1 drop into both eyes 2 (two) times daily as needed (eye irritation).     pantoprazole  (PROTONIX ) 40 MG tablet Take 1 tablet (40 mg total) by mouth 2 (two) times daily. 180 tablet 1   Polyethyl Glycol-Propyl Glycol (SYSTANE OP) Place 1 drop into both eyes daily as needed (dry eyes).     predniSONE  (DELTASONE ) 20 MG tablet 3 tabs by mouth daily x 3 days, then 2 tabs by mouth daily x 2 days then 1 tab by mouth daily x 2 days 15 tablet 0   rivaroxaban  (XARELTO ) 20 MG TABS tablet Take 1 tablet (20 mg total) by mouth daily with supper. 90 tablet 3   senna-docusate (SENOKOT-S) 8.6-50 MG tablet Take 2 tablets by mouth at bedtime as needed for moderate constipation. 60 tablet 0   Vibegron  (GEMTESA ) 75 MG TABS 1 tablet daily 30 tablet 0   Facility-Administered Medications Prior to Visit  Medication Dose Route Frequency Provider Last Rate Last Admin   tezepelumab -ekko (TEZSPIRE ) 210 MG/1. syringe 210 mg  210 mg Subcutaneous Q28 days Kozlow, Eric J, MD   210 mg at 08/04/24 1129     Per HPI unless specifically indicated in ROS  section below Review of Systems  Constitutional:  Negative for fatigue and fever.  HENT:  Negative for congestion.   Eyes:  Negative for pain.  Respiratory:  Negative for cough and shortness of breath.   Cardiovascular:  Negative for chest pain, palpitations and leg swelling.  Gastrointestinal:  Negative for abdominal pain.  Genitourinary:  Negative for dysuria and vaginal bleeding.  Musculoskeletal:  Negative for back pain.  Neurological:  Negative for syncope, light-headedness and headaches.  Psychiatric/Behavioral:  Negative for dysphoric mood.    Objective:  BP 118/60 (Cuff Size:  Normal)   Pulse (!) 105   Temp 98 F (36.7 C) (Oral)   Ht 5' 3 (1.6 m)   Wt 207 lb (93.9 kg)   SpO2 98%   BMI 36.67 kg/m   Wt Readings from Last 3 Encounters:  08/20/24 207 lb (93.9 kg)  08/05/24 203 lb (92.1 kg)  08/04/24 204 lb (92.5 kg)      Physical Exam Constitutional:      General: She is not in acute distress.    Appearance: Normal appearance. She is well-developed. She is not ill-appearing or toxic-appearing.  HENT:     Head: Normocephalic.     Comments: Dry oropharynx no bleeding seen    Right Ear: Hearing, tympanic membrane, ear canal and external ear normal. Tympanic membrane is not erythematous, retracted or bulging.     Left Ear: Hearing, tympanic membrane, ear canal and external ear normal. Tympanic membrane is not erythematous, retracted or bulging.     Nose: No mucosal edema or rhinorrhea.     Right Sinus: No maxillary sinus tenderness or frontal sinus tenderness.     Left Sinus: No maxillary sinus tenderness or frontal sinus tenderness.     Mouth/Throat:     Pharynx: Uvula midline.  Eyes:     General: Lids are normal. Lids are everted, no foreign bodies appreciated.     Conjunctiva/sclera: Conjunctivae normal.     Pupils: Pupils are equal, round, and reactive to light.  Neck:     Thyroid : No thyroid  mass or thyromegaly.     Vascular: No carotid bruit.     Trachea:  Trachea normal.  Cardiovascular:     Rate and Rhythm: Normal rate and regular rhythm.     Pulses: Normal pulses.     Heart sounds: Normal heart sounds, S1 normal and S2 normal. No murmur heard.    No friction rub. No gallop.  Pulmonary:     Effort: Pulmonary effort is normal. No tachypnea or respiratory distress.     Breath sounds: Normal breath sounds. No decreased breath sounds, wheezing, rhonchi or rales.  Abdominal:     General: Bowel sounds are normal.     Palpations: Abdomen is soft.     Tenderness: There is no abdominal tenderness.  Musculoskeletal:     Cervical back: Normal range of motion and neck supple.  Skin:    General: Skin is warm and dry.     Findings: No rash.  Neurological:     Mental Status: She is alert.  Psychiatric:        Mood and Affect: Mood is not anxious or depressed.        Speech: Speech normal.        Behavior: Behavior normal. Behavior is cooperative.        Thought Content: Thought content normal.        Judgment: Judgment normal.       Results for orders placed or performed during the hospital encounter of 07/28/24  I-STAT creatinine   Collection Time: 07/28/24  3:52 PM  Result Value Ref Range   Creatinine, Ser 1.00 0.44 - 1.00 mg/dL   *Note: Due to a large number of results and/or encounters for the requested time period, some results have not been displayed. A complete set of results can be found in Results Review.    Assessment and Plan  Hemoptysis Assessment & Plan: Acute, recurrent. Treated for pneumonia after first episode of hemoptysis as CT scan showed right upper lobe abnormality. She has CT  scan repeat scheduled next week. While hemoptysis could be due to aggressive throat clearing, dry mucous membranes on Xarelto , I have significant concern for lung mass given initial CT appearance and  the fact that the patient never had symptoms of pneumonia. Also concerning is the new right upper shoulder pain that improved with prednisone   but is recurrent.  Concerned about Pancoast tumor and resulting shoulder pain. If CT scan is clear we will move forward with ENT referral for evaluation with laryngoscopy.  Lung exam is clear and patient without shortness of breath.  ER and return precautions provided to patient.  If significant further hemoptysis she is to go to the emergency room.    Acute pain of right shoulder Assessment & Plan: Acute, initial improvement with prednisone  but now pain has returned.  Patient plans to make appointment with orthopedic for further evaluation.     No follow-ups on file.   Greig Ring, MD  "

## 2024-08-21 NOTE — Telephone Encounter (Signed)
 Pt has been notified and voices understanding.

## 2024-08-25 ENCOUNTER — Ambulatory Visit: Admitting: Allergy and Immunology

## 2024-08-25 ENCOUNTER — Other Ambulatory Visit

## 2024-08-26 ENCOUNTER — Other Ambulatory Visit

## 2024-09-01 ENCOUNTER — Ambulatory Visit

## 2024-09-11 ENCOUNTER — Ambulatory Visit: Admitting: Podiatry

## 2024-11-19 ENCOUNTER — Ambulatory Visit: Admitting: Family Medicine

## 2025-05-04 ENCOUNTER — Ambulatory Visit: Admitting: Internal Medicine

## 2025-05-21 ENCOUNTER — Ambulatory Visit

## 2025-08-12 ENCOUNTER — Inpatient Hospital Stay: Admitting: Hematology and Oncology
